# Patient Record
Sex: Female | Born: 1935 | Race: White | Hispanic: No | Smoking: Never smoker
Health system: Southern US, Community
[De-identification: ages and names within clinical notes are randomized; demographics above are authoritative.]

## PROBLEM LIST (undated history)

## (undated) DIAGNOSIS — Z860100 Personal history of colon polyps, unspecified: Secondary | ICD-10-CM

## (undated) DIAGNOSIS — N289 Disorder of kidney and ureter, unspecified: Secondary | ICD-10-CM

## (undated) DIAGNOSIS — H04123 Dry eye syndrome of bilateral lacrimal glands: Secondary | ICD-10-CM

## (undated) DIAGNOSIS — C3491 Malignant neoplasm of unspecified part of right bronchus or lung: Secondary | ICD-10-CM

## (undated) DIAGNOSIS — M1990 Unspecified osteoarthritis, unspecified site: Secondary | ICD-10-CM

## (undated) DIAGNOSIS — K5909 Other constipation: Secondary | ICD-10-CM

## (undated) DIAGNOSIS — M810 Age-related osteoporosis without current pathological fracture: Secondary | ICD-10-CM

## (undated) DIAGNOSIS — M1380 Other specified arthritis, unspecified site: Secondary | ICD-10-CM

## (undated) DIAGNOSIS — K449 Diaphragmatic hernia without obstruction or gangrene: Secondary | ICD-10-CM

## (undated) DIAGNOSIS — I429 Cardiomyopathy, unspecified: Secondary | ICD-10-CM

## (undated) DIAGNOSIS — Z862 Personal history of diseases of the blood and blood-forming organs and certain disorders involving the immune mechanism: Secondary | ICD-10-CM

## (undated) DIAGNOSIS — M069 Rheumatoid arthritis, unspecified: Secondary | ICD-10-CM

## (undated) DIAGNOSIS — R112 Nausea with vomiting, unspecified: Secondary | ICD-10-CM

## (undated) DIAGNOSIS — K219 Gastro-esophageal reflux disease without esophagitis: Secondary | ICD-10-CM

## (undated) DIAGNOSIS — I509 Heart failure, unspecified: Secondary | ICD-10-CM

## (undated) DIAGNOSIS — I7300 Raynaud's syndrome without gangrene: Secondary | ICD-10-CM

## (undated) DIAGNOSIS — R351 Nocturia: Secondary | ICD-10-CM

## (undated) DIAGNOSIS — M419 Scoliosis, unspecified: Secondary | ICD-10-CM

## (undated) DIAGNOSIS — Z872 Personal history of diseases of the skin and subcutaneous tissue: Secondary | ICD-10-CM

## (undated) DIAGNOSIS — E039 Hypothyroidism, unspecified: Secondary | ICD-10-CM

## (undated) DIAGNOSIS — M3500 Sicca syndrome, unspecified: Secondary | ICD-10-CM

## (undated) DIAGNOSIS — D509 Iron deficiency anemia, unspecified: Secondary | ICD-10-CM

## (undated) DIAGNOSIS — M2550 Pain in unspecified joint: Secondary | ICD-10-CM

## (undated) DIAGNOSIS — I447 Left bundle-branch block, unspecified: Secondary | ICD-10-CM

## (undated) DIAGNOSIS — M5430 Sciatica, unspecified side: Secondary | ICD-10-CM

## (undated) DIAGNOSIS — Z9889 Other specified postprocedural states: Secondary | ICD-10-CM

## (undated) DIAGNOSIS — I7381 Erythromelalgia: Secondary | ICD-10-CM

## (undated) HISTORY — DX: Sjogren syndrome, unspecified: M35.00

## (undated) HISTORY — PX: APPENDECTOMY: SHX54

## (undated) HISTORY — PX: COLONOSCOPY: SHX174

## (undated) HISTORY — PX: TONSILLECTOMY: SUR1361

## (undated) HISTORY — DX: Cardiomyopathy, unspecified: I42.9

## (undated) HISTORY — DX: Iron deficiency anemia, unspecified: D50.9

## (undated) HISTORY — DX: Unspecified osteoarthritis, unspecified site: M19.90

## (undated) HISTORY — DX: Raynaud's syndrome without gangrene: I73.00

## (undated) HISTORY — PX: TOTAL ABDOMINAL HYSTERECTOMY: SHX209

---

## 1963-02-18 DIAGNOSIS — I7300 Raynaud's syndrome without gangrene: Secondary | ICD-10-CM

## 1963-02-18 HISTORY — DX: Raynaud's syndrome without gangrene: I73.00

## 1963-02-18 HISTORY — PX: THORACIC SYMPATHETECTOMY: SHX2503

## 2001-04-20 ENCOUNTER — Ambulatory Visit (HOSPITAL_COMMUNITY): Admission: RE | Admit: 2001-04-20 | Discharge: 2001-04-20 | Payer: Self-pay | Admitting: Ophthalmology

## 2003-03-13 ENCOUNTER — Inpatient Hospital Stay (HOSPITAL_COMMUNITY): Admission: EM | Admit: 2003-03-13 | Discharge: 2003-03-16 | Payer: Self-pay | Admitting: *Deleted

## 2005-07-09 ENCOUNTER — Ambulatory Visit: Payer: Self-pay | Admitting: Internal Medicine

## 2005-08-18 ENCOUNTER — Ambulatory Visit: Payer: Self-pay | Admitting: Gastroenterology

## 2005-09-02 ENCOUNTER — Ambulatory Visit: Payer: Self-pay | Admitting: Gastroenterology

## 2005-10-16 ENCOUNTER — Ambulatory Visit: Payer: Self-pay | Admitting: Internal Medicine

## 2006-06-09 ENCOUNTER — Ambulatory Visit: Payer: Self-pay | Admitting: Family Medicine

## 2006-06-09 LAB — CONVERTED CEMR LAB
Basophils Relative: 0.8 % (ref 0.0–1.0)
Eosinophils Relative: 1 % (ref 0.0–5.0)
Glucose, Bld: 82 mg/dL (ref 70–99)
HDL: 67.6 mg/dL (ref >39.0)
LDL Cholesterol: 90 mg/dL (ref 0–99)
Lymphocytes Relative: 38.5 % (ref 12.0–46.0)
Monocytes Relative: 10.8 % (ref 3.0–11.0)
Platelets: 163 10*3/uL (ref 150–400)
RDW: 12.6 % (ref 11.5–14.6)
TSH: 4.16 microintl units/mL (ref 0.35–5.50)
Triglycerides: 78 mg/dL (ref 0–149)
VLDL: 16 mg/dL (ref 0–40)
WBC: 3.1 10*3/uL — ABNORMAL LOW (ref 4.5–10.5)

## 2006-06-29 DIAGNOSIS — I7300 Raynaud's syndrome without gangrene: Secondary | ICD-10-CM | POA: Insufficient documentation

## 2006-06-29 DIAGNOSIS — E162 Hypoglycemia, unspecified: Secondary | ICD-10-CM | POA: Insufficient documentation

## 2006-06-30 ENCOUNTER — Encounter (INDEPENDENT_AMBULATORY_CARE_PROVIDER_SITE_OTHER): Payer: Self-pay | Admitting: Family Medicine

## 2006-06-30 ENCOUNTER — Ambulatory Visit: Payer: Self-pay | Admitting: Family Medicine

## 2006-06-30 ENCOUNTER — Encounter: Payer: Self-pay | Admitting: Family Medicine

## 2006-09-24 ENCOUNTER — Ambulatory Visit: Payer: Self-pay | Admitting: Family Medicine

## 2006-11-02 ENCOUNTER — Telehealth (INDEPENDENT_AMBULATORY_CARE_PROVIDER_SITE_OTHER): Payer: Self-pay | Admitting: *Deleted

## 2006-11-02 ENCOUNTER — Ambulatory Visit: Payer: Self-pay | Admitting: Family Medicine

## 2006-11-02 DIAGNOSIS — L259 Unspecified contact dermatitis, unspecified cause: Secondary | ICD-10-CM | POA: Insufficient documentation

## 2006-12-21 ENCOUNTER — Ambulatory Visit (HOSPITAL_COMMUNITY): Admission: RE | Admit: 2006-12-21 | Discharge: 2006-12-21 | Payer: Self-pay | Admitting: Family Medicine

## 2007-03-18 ENCOUNTER — Ambulatory Visit: Payer: Self-pay | Admitting: Family Medicine

## 2007-03-19 ENCOUNTER — Telehealth (INDEPENDENT_AMBULATORY_CARE_PROVIDER_SITE_OTHER): Payer: Self-pay | Admitting: *Deleted

## 2007-03-19 LAB — CONVERTED CEMR LAB
ALT: 20 units/L (ref 0–35)
AST: 31 units/L (ref 0–37)
Albumin: 3.9 g/dL (ref 3.5–5.2)
Basophils Absolute: 0 10*3/uL (ref 0.0–0.1)
Basophils Relative: 0.9 % (ref 0.0–1.0)
Bilirubin, Direct: 0.1 mg/dL (ref 0.0–0.3)
HCT: 33.9 % — ABNORMAL LOW (ref 36.0–46.0)
MCHC: 34.4 g/dL (ref 30.0–36.0)
Neutrophils Relative %: 49.3 % (ref 43.0–77.0)
RBC: 3.76 M/uL — ABNORMAL LOW (ref 3.87–5.11)
RDW: 12.3 % (ref 11.5–14.6)
Total Bilirubin: 0.7 mg/dL (ref 0.3–1.2)
WBC: 3.6 10*3/uL — ABNORMAL LOW (ref 4.5–10.5)

## 2007-03-29 ENCOUNTER — Encounter (INDEPENDENT_AMBULATORY_CARE_PROVIDER_SITE_OTHER): Payer: Self-pay | Admitting: *Deleted

## 2007-03-29 ENCOUNTER — Telehealth (INDEPENDENT_AMBULATORY_CARE_PROVIDER_SITE_OTHER): Payer: Self-pay | Admitting: *Deleted

## 2007-11-29 ENCOUNTER — Ambulatory Visit: Payer: Self-pay | Admitting: Family Medicine

## 2007-11-30 ENCOUNTER — Encounter (INDEPENDENT_AMBULATORY_CARE_PROVIDER_SITE_OTHER): Payer: Self-pay | Admitting: *Deleted

## 2007-12-23 ENCOUNTER — Ambulatory Visit (HOSPITAL_COMMUNITY): Admission: RE | Admit: 2007-12-23 | Discharge: 2007-12-23 | Payer: Self-pay | Admitting: Family Medicine

## 2007-12-23 ENCOUNTER — Encounter: Payer: Self-pay | Admitting: Family Medicine

## 2008-02-18 DIAGNOSIS — I447 Left bundle-branch block, unspecified: Secondary | ICD-10-CM

## 2008-02-18 HISTORY — DX: Left bundle-branch block, unspecified: I44.7

## 2008-09-22 ENCOUNTER — Encounter (INDEPENDENT_AMBULATORY_CARE_PROVIDER_SITE_OTHER): Payer: Self-pay | Admitting: *Deleted

## 2008-09-22 ENCOUNTER — Encounter: Payer: Self-pay | Admitting: Family Medicine

## 2008-09-22 LAB — CONVERTED CEMR LAB
ALT: 20 units/L
AST: 33 units/L
Albumin: 4.1 g/dL
Alkaline Phosphatase: 38 units/L
BUN: 17 mg/dL
MCV: 90.3 fL
Potassium, serum: 4.3 mmol/L
Sodium, serum: 133 mmol/L
Total Protein: 6.3 g/dL
WBC, blood: 3.2 10*3/uL

## 2008-09-25 ENCOUNTER — Encounter: Payer: Self-pay | Admitting: Family Medicine

## 2008-11-29 ENCOUNTER — Ambulatory Visit: Payer: Self-pay | Admitting: Family Medicine

## 2008-11-29 DIAGNOSIS — J069 Acute upper respiratory infection, unspecified: Secondary | ICD-10-CM | POA: Insufficient documentation

## 2008-11-29 DIAGNOSIS — R9431 Abnormal electrocardiogram [ECG] [EKG]: Secondary | ICD-10-CM | POA: Insufficient documentation

## 2008-11-29 DIAGNOSIS — M3500 Sicca syndrome, unspecified: Secondary | ICD-10-CM | POA: Insufficient documentation

## 2008-11-30 ENCOUNTER — Encounter (INDEPENDENT_AMBULATORY_CARE_PROVIDER_SITE_OTHER): Payer: Self-pay | Admitting: *Deleted

## 2008-12-05 ENCOUNTER — Ambulatory Visit: Payer: Self-pay | Admitting: Family Medicine

## 2008-12-05 LAB — CONVERTED CEMR LAB: OCCULT 2: NEGATIVE

## 2008-12-06 ENCOUNTER — Encounter (INDEPENDENT_AMBULATORY_CARE_PROVIDER_SITE_OTHER): Payer: Self-pay | Admitting: *Deleted

## 2008-12-11 ENCOUNTER — Ambulatory Visit: Payer: Self-pay | Admitting: Cardiology

## 2008-12-11 ENCOUNTER — Encounter: Payer: Self-pay | Admitting: Family Medicine

## 2008-12-11 ENCOUNTER — Ambulatory Visit (HOSPITAL_COMMUNITY): Admission: RE | Admit: 2008-12-11 | Discharge: 2008-12-11 | Payer: Self-pay | Admitting: Family Medicine

## 2008-12-11 ENCOUNTER — Ambulatory Visit: Payer: Self-pay

## 2008-12-11 HISTORY — PX: TRANSTHORACIC ECHOCARDIOGRAM: SHX275

## 2008-12-12 ENCOUNTER — Encounter: Payer: Self-pay | Admitting: Family Medicine

## 2008-12-12 ENCOUNTER — Emergency Department (HOSPITAL_COMMUNITY): Admission: EM | Admit: 2008-12-12 | Discharge: 2008-12-12 | Payer: Self-pay | Admitting: Emergency Medicine

## 2008-12-14 ENCOUNTER — Ambulatory Visit: Payer: Self-pay | Admitting: Internal Medicine

## 2008-12-14 DIAGNOSIS — R9389 Abnormal findings on diagnostic imaging of other specified body structures: Secondary | ICD-10-CM | POA: Insufficient documentation

## 2008-12-18 ENCOUNTER — Telehealth: Payer: Self-pay | Admitting: Internal Medicine

## 2008-12-18 ENCOUNTER — Encounter: Payer: Self-pay | Admitting: Internal Medicine

## 2008-12-18 DIAGNOSIS — R930 Abnormal findings on diagnostic imaging of skull and head, not elsewhere classified: Secondary | ICD-10-CM | POA: Insufficient documentation

## 2008-12-18 HISTORY — PX: CARDIOVASCULAR STRESS TEST: SHX262

## 2008-12-19 ENCOUNTER — Telehealth (INDEPENDENT_AMBULATORY_CARE_PROVIDER_SITE_OTHER): Payer: Self-pay | Admitting: *Deleted

## 2008-12-20 ENCOUNTER — Telehealth: Payer: Self-pay | Admitting: Internal Medicine

## 2008-12-21 ENCOUNTER — Telehealth: Payer: Self-pay | Admitting: Internal Medicine

## 2008-12-21 ENCOUNTER — Telehealth (INDEPENDENT_AMBULATORY_CARE_PROVIDER_SITE_OTHER): Payer: Self-pay | Admitting: *Deleted

## 2008-12-21 ENCOUNTER — Telehealth (INDEPENDENT_AMBULATORY_CARE_PROVIDER_SITE_OTHER): Payer: Self-pay

## 2008-12-25 ENCOUNTER — Ambulatory Visit: Payer: Self-pay

## 2008-12-25 ENCOUNTER — Ambulatory Visit: Payer: Self-pay | Admitting: Cardiology

## 2008-12-25 ENCOUNTER — Encounter (HOSPITAL_COMMUNITY): Admission: RE | Admit: 2008-12-25 | Discharge: 2009-02-13 | Payer: Self-pay | Admitting: Internal Medicine

## 2008-12-26 ENCOUNTER — Ambulatory Visit (HOSPITAL_COMMUNITY): Admission: RE | Admit: 2008-12-26 | Discharge: 2008-12-26 | Payer: Self-pay | Admitting: Internal Medicine

## 2008-12-28 ENCOUNTER — Ambulatory Visit: Payer: Self-pay | Admitting: Pulmonary Disease

## 2008-12-28 ENCOUNTER — Ambulatory Visit: Payer: Self-pay | Admitting: Internal Medicine

## 2009-01-05 ENCOUNTER — Telehealth (INDEPENDENT_AMBULATORY_CARE_PROVIDER_SITE_OTHER): Payer: Self-pay | Admitting: *Deleted

## 2009-01-15 ENCOUNTER — Ambulatory Visit: Payer: Self-pay | Admitting: Internal Medicine

## 2009-01-16 ENCOUNTER — Encounter: Payer: Self-pay | Admitting: Internal Medicine

## 2009-01-16 LAB — CONVERTED CEMR LAB: Bacteria, UA: NONE SEEN

## 2009-03-16 ENCOUNTER — Ambulatory Visit: Payer: Self-pay | Admitting: Family

## 2009-03-16 ENCOUNTER — Ambulatory Visit (HOSPITAL_BASED_OUTPATIENT_CLINIC_OR_DEPARTMENT_OTHER): Admission: RE | Admit: 2009-03-16 | Discharge: 2009-03-16 | Payer: Self-pay | Admitting: Internal Medicine

## 2009-03-16 ENCOUNTER — Ambulatory Visit: Payer: Self-pay | Admitting: Diagnostic Radiology

## 2009-03-16 DIAGNOSIS — M549 Dorsalgia, unspecified: Secondary | ICD-10-CM | POA: Insufficient documentation

## 2009-03-16 DIAGNOSIS — M545 Low back pain, unspecified: Secondary | ICD-10-CM | POA: Insufficient documentation

## 2009-03-16 LAB — CONVERTED CEMR LAB
Bilirubin Urine: NEGATIVE
Blood in Urine, dipstick: NEGATIVE
Glucose, Urine, Semiquant: NEGATIVE
Ketones, urine, test strip: NEGATIVE

## 2009-03-17 ENCOUNTER — Encounter: Payer: Self-pay | Admitting: Family Medicine

## 2009-03-19 ENCOUNTER — Telehealth (INDEPENDENT_AMBULATORY_CARE_PROVIDER_SITE_OTHER): Payer: Self-pay | Admitting: *Deleted

## 2009-03-26 ENCOUNTER — Ambulatory Visit: Payer: Self-pay | Admitting: Radiology

## 2009-03-26 ENCOUNTER — Ambulatory Visit (HOSPITAL_BASED_OUTPATIENT_CLINIC_OR_DEPARTMENT_OTHER): Admission: RE | Admit: 2009-03-26 | Discharge: 2009-03-26 | Payer: Self-pay | Admitting: Pulmonary Disease

## 2009-04-05 ENCOUNTER — Ambulatory Visit: Payer: Self-pay | Admitting: Pulmonary Disease

## 2009-06-23 ENCOUNTER — Ambulatory Visit: Payer: Self-pay | Admitting: Diagnostic Radiology

## 2009-06-23 ENCOUNTER — Emergency Department (HOSPITAL_BASED_OUTPATIENT_CLINIC_OR_DEPARTMENT_OTHER): Admission: EM | Admit: 2009-06-23 | Discharge: 2009-06-23 | Payer: Self-pay | Admitting: Emergency Medicine

## 2009-10-01 ENCOUNTER — Ambulatory Visit: Payer: Self-pay | Admitting: Family Medicine

## 2009-10-01 ENCOUNTER — Telehealth (INDEPENDENT_AMBULATORY_CARE_PROVIDER_SITE_OTHER): Payer: Self-pay | Admitting: *Deleted

## 2009-10-01 DIAGNOSIS — R42 Dizziness and giddiness: Secondary | ICD-10-CM | POA: Insufficient documentation

## 2009-10-01 LAB — CONVERTED CEMR LAB
Glucose, Urine, Semiquant: NEGATIVE
KOH Prep: NEGATIVE
Nitrite: NEGATIVE
Protein, U semiquant: NEGATIVE
Urobilinogen, UA: 0.2
WBC Urine, dipstick: NEGATIVE

## 2009-10-04 ENCOUNTER — Encounter: Payer: Self-pay | Admitting: Family Medicine

## 2009-11-05 ENCOUNTER — Encounter: Payer: Self-pay | Admitting: Family Medicine

## 2009-11-28 ENCOUNTER — Telehealth: Payer: Self-pay | Admitting: Family Medicine

## 2009-11-29 ENCOUNTER — Ambulatory Visit: Payer: Self-pay | Admitting: Family Medicine

## 2009-11-29 DIAGNOSIS — S8010XA Contusion of unspecified lower leg, initial encounter: Secondary | ICD-10-CM | POA: Insufficient documentation

## 2009-11-29 LAB — CONVERTED CEMR LAB
Basophils Relative: 0.8 % (ref 0.0–3.0)
Eosinophils Absolute: 0 10*3/uL (ref 0.0–0.7)
Eosinophils Relative: 0.7 % (ref 0.0–5.0)
Lymphocytes Relative: 31.1 % (ref 12.0–46.0)
Neutrophils Relative %: 57.2 % (ref 43.0–77.0)
Platelets: 163 10*3/uL (ref 150.0–400.0)
RBC: 3.74 M/uL — ABNORMAL LOW (ref 3.87–5.11)
WBC: 3.5 10*3/uL — ABNORMAL LOW (ref 4.5–10.5)

## 2009-12-11 ENCOUNTER — Ambulatory Visit (HOSPITAL_COMMUNITY): Admission: RE | Admit: 2009-12-11 | Discharge: 2009-12-11 | Payer: Self-pay | Admitting: Family Medicine

## 2009-12-24 ENCOUNTER — Encounter: Admission: RE | Admit: 2009-12-24 | Discharge: 2009-12-24 | Payer: Self-pay | Admitting: Family Medicine

## 2009-12-24 LAB — HM MAMMOGRAPHY: HM Mammogram: NORMAL

## 2010-03-07 ENCOUNTER — Other Ambulatory Visit: Payer: Self-pay | Admitting: Family Medicine

## 2010-03-07 ENCOUNTER — Ambulatory Visit
Admission: RE | Admit: 2010-03-07 | Discharge: 2010-03-07 | Payer: Self-pay | Source: Home / Self Care | Attending: Family Medicine | Admitting: Family Medicine

## 2010-03-07 ENCOUNTER — Encounter: Payer: Self-pay | Admitting: Family Medicine

## 2010-03-07 DIAGNOSIS — R209 Unspecified disturbances of skin sensation: Secondary | ICD-10-CM | POA: Insufficient documentation

## 2010-03-07 DIAGNOSIS — M810 Age-related osteoporosis without current pathological fracture: Secondary | ICD-10-CM | POA: Insufficient documentation

## 2010-03-07 DIAGNOSIS — M8580 Other specified disorders of bone density and structure, unspecified site: Secondary | ICD-10-CM | POA: Insufficient documentation

## 2010-03-07 LAB — CBC WITH DIFFERENTIAL/PLATELET
Basophils Absolute: 0 10*3/uL (ref 0.0–0.1)
Basophils Relative: 0.4 % (ref 0.0–3.0)
Eosinophils Absolute: 0 10*3/uL (ref 0.0–0.7)
Eosinophils Relative: 0.4 % (ref 0.0–5.0)
HCT: 35.2 % — ABNORMAL LOW (ref 36.0–46.0)
Hemoglobin: 12.1 g/dL (ref 12.0–15.0)
Lymphocytes Relative: 23.7 % (ref 12.0–46.0)
Lymphs Abs: 1 10*3/uL (ref 0.7–4.0)
MCHC: 34.5 g/dL (ref 30.0–36.0)
MCV: 90.4 fl (ref 78.0–100.0)
Monocytes Absolute: 0.4 10*3/uL (ref 0.1–1.0)
Monocytes Relative: 9.4 % (ref 3.0–12.0)
Neutro Abs: 2.9 10*3/uL (ref 1.4–7.7)
Neutrophils Relative %: 66.1 % (ref 43.0–77.0)
Platelets: 166 10*3/uL (ref 150.0–400.0)
RBC: 3.89 Mil/uL (ref 3.87–5.11)
RDW: 13.4 % (ref 11.5–14.6)
WBC: 4.4 10*3/uL — ABNORMAL LOW (ref 4.5–10.5)

## 2010-03-07 LAB — BASIC METABOLIC PANEL
BUN: 20 mg/dL (ref 6–23)
CO2: 29 mEq/L (ref 19–32)
Calcium: 9.1 mg/dL (ref 8.4–10.5)
Chloride: 95 mEq/L — ABNORMAL LOW (ref 96–112)
Creatinine, Ser: 0.9 mg/dL (ref 0.4–1.2)
GFR: 67.05 mL/min (ref >60.00)
Glucose, Bld: 74 mg/dL (ref 70–99)
Potassium: 4.2 mEq/L (ref 3.5–5.1)
Sodium: 131 mEq/L — ABNORMAL LOW (ref 135–145)

## 2010-03-07 LAB — B12 AND FOLATE PANEL
Folate: 20.5 ng/mL (ref >5.9)
Vitamin B-12: 649 pg/mL (ref 211–911)

## 2010-03-07 LAB — HEPATIC FUNCTION PANEL
ALT: 19 U/L (ref 0–35)
AST: 29 U/L (ref 0–37)
Albumin: 4.2 g/dL (ref 3.5–5.2)
Alkaline Phosphatase: 35 U/L — ABNORMAL LOW (ref 39–117)
Bilirubin, Direct: 0.1 mg/dL (ref 0.0–0.3)
Total Bilirubin: 0.5 mg/dL (ref 0.3–1.2)
Total Protein: 6.7 g/dL (ref 6.0–8.3)

## 2010-03-07 LAB — LIPID PANEL
Cholesterol: 189 mg/dL (ref 0–200)
HDL: 76.7 mg/dL (ref >39.00)
LDL Cholesterol: 102 mg/dL — ABNORMAL HIGH (ref 0–99)
Total CHOL/HDL Ratio: 2
Triglycerides: 53 mg/dL (ref 0.0–149.0)
VLDL: 10.6 mg/dL (ref 0.0–40.0)

## 2010-03-07 LAB — TSH: TSH: 4.13 u[IU]/mL (ref 0.35–5.50)

## 2010-03-10 ENCOUNTER — Encounter: Payer: Self-pay | Admitting: Pulmonary Disease

## 2010-03-11 ENCOUNTER — Encounter: Payer: Self-pay | Admitting: Family Medicine

## 2010-03-17 LAB — CONVERTED CEMR LAB
ALT: 20 units/L (ref 0–35)
AST: 29 units/L (ref 0–37)
Basophils Absolute: 0 10*3/uL (ref 0.0–0.1)
Basophils Relative: 0.6 % (ref 0.0–1.0)
Bilirubin, Direct: 0.1 mg/dL (ref 0.0–0.3)
CO2: 32 meq/L (ref 19–32)
Chloride: 101 meq/L (ref 96–112)
Cholesterol: 179 mg/dL (ref 0–200)
Cholesterol: 186 mg/dL (ref 0–200)
Complement C4, Body Fluid: 18 mg/dL (ref 16–47)
ENA SM Ab Ser-aCnc: <0.2 (ref <1.0)
Eosinophils Relative: 0.8 % (ref 0.0–5.0)
HCT: 35.5 % — ABNORMAL LOW (ref 36.0–46.0)
Hemoglobin: 12 g/dL (ref 12.0–15.0)
LDL Cholesterol: 102 mg/dL — ABNORMAL HIGH (ref 0–99)
LDL Cholesterol: 98 mg/dL (ref 0–99)
Lymphocytes Relative: 30.9 % (ref 12.0–46.0)
Lymphocytes Relative: 35.2 % (ref 12.0–46.0)
MCHC: 34.7 g/dL (ref 30.0–36.0)
Monocytes Absolute: 0.4 10*3/uL (ref 0.2–0.7)
Neutro Abs: 2.1 10*3/uL (ref 1.4–7.7)
Neutrophils Relative %: 52.5 % (ref 43.0–77.0)
Neutrophils Relative %: 56.8 % (ref 43.0–77.0)
Potassium: 4.1 meq/L (ref 3.5–5.1)
RDW: 12.2 % (ref 11.5–14.6)
RDW: 12.5 % (ref 11.5–14.6)
Sed Rate: 6 mm/hr (ref 0–25)
Sodium: 138 meq/L (ref 135–145)
TSH: 3.83 microintl units/mL (ref 0.35–5.50)
Total Bilirubin: 0.7 mg/dL (ref 0.3–1.2)
Triglycerides: 62 mg/dL (ref 0–149)
VLDL: 12 mg/dL (ref 0–40)
VLDL: 9.4 mg/dL (ref 0.0–40.0)
WBC: 3.8 10*3/uL — ABNORMAL LOW (ref 4.5–10.5)
ds DNA Ab: 1 (ref <5)

## 2010-03-19 NOTE — Assessment & Plan Note (Signed)
Summary: uti - redness/cb   Vital Signs:  Patient profile:   77 year old female Height:      64.25 inches (163.19 cm) Weight:      111 pounds (50.45 kg) BMI:     18.97 O2 Sat:      94 % on Room air Temp:     97.8 degrees F (36.56 degrees C) oral Pulse rate:   74 / minute BP sitting:   110 / 60  (right arm) Cuff size:   regular  Vitals Entered By: Lucious Groves CMA (October 01, 2009 3:48 PM)  O2 Flow:  Room air CC: C/O Possible UTI--urinary burning with hesititency and red spot on vulva./kb Is Patient Diabetic? No Pain Assessment Patient in pain? no      Comments Patient notes that she saw the red spot on vulva with mirror, has some nausea, vomited yesterday and a little diarrhea./kb   History of Present Illness: 77 yo woman here today w/ ? UTI.  2 weeks ago felt she had a yeast infxn.  used an at home test kit- 'it was a little tinge-y'.  didn't do anything about it.  sxs have worsened.  now w/ itching and burning.  had some dizziness, vomiting, and diarrhea yesterday morning at 4am- has hx of hypoglycemia.  having small 'waves of nausea' today.  has a 'red spot' on her vulva- looked w/ a mirror.  denies frequency, urgency, hesitancy.  Problems Prior to Update: 1)  Dysuria  (ICD-788.1) 2)  Back Pain, Left  (ICD-724.5) 3)  Ct, Chest, Abnormal  (ICD-793.1) 4)  Abnormal Echocardiogram  (ICD-793.2) 5)  Abnormal Ekg  (ICD-794.31) 6)  Abnormal Electrocardiogram  (ICD-794.31) 7)  Uri  (ICD-465.9) 8)  Sjogren's Syndrome  (ICD-710.2) 9)  Healthy Adult Female  (ICD-V70.0) 10)  Screening For Lipoid Disorders  (ICD-V77.91) 11)  Contact Dermatitis  (ICD-692.9) 12)  Anemia-iron Deficiency  (ICD-280.9) 13)  Hypoglycemia  (ICD-251.2) 14)  Raynaud's Disease  (ICD-443.0)  Current Medications (verified): 1)  Plaquenil 200 Mg  Tabs (Hydroxychloroquine Sulfate) .... Take Two Times A Day 2)  Mobic 15 Mg  Tabs (Meloxicam) .... Take One Tablet Daily 3)  Biotin 1000 Mcg Tabs (Biotin) .... Two  Times A Day 4)  Vitamin C 500 Mg  Tabs (Ascorbic Acid) .... Take One Tablet By Mouth Once Daily. 5)  Multivitamins   Tabs (Multiple Vitamin) .... Take One Tablet By Mouth Once Daily. 6)  Vitamin D 1000 Unit Tabs (Cholecalciferol) .Marland Kitchen.. 1 Tab Once Daily 7)  Glucosamine-Chondroitin 1500-1200 Mg/66ml Liqd (Glucosamine-Chondroitin) .Marland Kitchen.. 1 Tab Once Daily 8)  Calcium-Vitamin D 500-200 Mg-Unit Tabs (Calcium Carbonate-Vitamin D) .... Take 1 Tablet By Mouth Once A Day 9)  Stool Softener 100 Mg Caps (Docusate Sodium) .Marland Kitchen.. 1 Cap Two Times A Day 10)  Diflucan 150 Mg Tabs (Fluconazole) .... Once Daily.  May Repeat in 3 Days If Symptoms Persist  Allergies (verified): 1)  ! Compazine 2)  ! Aspirin (Aspirin)  Review of Systems      See HPI  Physical Exam  General:  thin white female, NAD Lungs:  Normal respiratory effort, chest expands symmetrically. Lungs are clear to auscultation, no crackles or wheezes. Heart:  Normal rate and regular rhythm. S1 and S2 normal without gallop, murmur, click, rub or other extra sounds. Abdomen:  no CVA or suprapubic tenderness soft, NT/ND, +BS Genitalia:  red spot is not on vulva or labia, redness is erythematous urethra no visible yeast or vaginal discharge thin mucosa, + atrophy  Neurologic:  alert & oriented X3, strength normal in all extremities, sensation intact to light touch, and gait normal.     Impression & Recommendations:  Problem # 1:  DYSURIA (ICD-788.1) Assessment New pt's UA normal- no evidence of infxn.  wet prep w/ rare yeast.  uncertain as to cause of very red urethra but the external irritation is likely the cause of pt's sxs.  start diflucan, miconazole cream.  refer to urology.  Pt expresses understanding and is in agreement w/ this plan. Orders: T-Culture, Urine ([id number]) Urology Referral (Urology) Wet Prep [phone]) UA Dipstick w/o Micro (manual) ([id number]) Prescription Created Electronically [phone])  Problem # 2:  DIZZINESS  (ICD-780.4) Assessment: New pt's sxs sound like a hypoglycemic or vagal episode.  pt admits to eating little prior to bed.  sxs have resolved and PE WNL.  reviewed supportive care and red flags that should prompt return.  Pt expresses understanding and is in agreement w/ this plan.  Complete Medication List: 1)  Plaquenil 200 Mg Tabs (Hydroxychloroquine sulfate) .... Take two times a day 2)  Mobic 15 Mg Tabs (Meloxicam) .... Take one tablet daily 3)  Biotin 1000 Mcg Tabs (Biotin) .... Two times a day 4)  Vitamin C 500 Mg Tabs (Ascorbic acid) .... Take one tablet by mouth once daily. 5)  Multivitamins Tabs (Multiple vitamin) .... Take one tablet by mouth once daily. 6)  Vitamin D 1000 Unit Tabs (Cholecalciferol) .Marland Kitchen.. 1 tab once daily 7)  Glucosamine-chondroitin 1500-1200 Mg/48ml Liqd (Glucosamine-chondroitin) .Marland Kitchen.. 1 tab once daily 8)  Calcium-vitamin D 500-200 Mg-unit Tabs (Calcium carbonate-vitamin d) .... Take 1 tablet by mouth once a day 9)  Stool Softener 100 Mg Caps (Docusate sodium) .Marland Kitchen.. 1 cap two times a day 10)  Diflucan 150 Mg Tabs (Fluconazole) .... Once daily.  may repeat in 3 days if symptoms persist  Patient Instructions: 1)  Someone will call you with your urology appt 2)  The red spot that you see is an inflamed urethra 3)  Take the Diflucan for yeast 4)  Use over the counter miconazole cream to soothe the irritation 5)  We'll culture the urine to rule out infection 6)  Hang in there!! Prescriptions: DIFLUCAN 150 MG TABS (FLUCONAZOLE) once daily.  may repeat in 3 days if symptoms persist  #2 x 0   Entered and Authorized by:   Neena Rhymes MD   Signed by:   Neena Rhymes MD on 10/01/2009   Method used:   Electronically to        [facility] [phone]* (retail)       [address]       [place], [place]  [address]       Ph: [phone]       Fax: [phone]   RxID:   [id number]   Laboratory Results   Urine Tests  Date/Time  Received: Lucious Groves [facility]  October 01, 2009 3:45 PM  Date/Time Reported: Lucious Groves [facility]  October 01, 2009 3:45 PM   Routine Urinalysis   Color: yellow Appearance: Clear Glucose: negative   (Normal Range: Negative) Bilirubin: negative   (Normal Range: Negative) Ketone: negative   (Normal Range: Negative) Spec. Gravity: >=1.030   (Normal Range: 1.003-1.035) Blood: negative   (Normal Range: Negative) pH: 5.0   (Normal Range: 5.0-8.0) Protein: negative   (Normal Range: Negative) Urobilinogen: 0.2   (Normal Range: 0-1) Nitrite: negative   (Normal Range: Negative) Leukocyte Esterace: negative   (  Normal Range: Negative)      Wet Mount WBC/hpf: 1-5 Bacteria/hpf: rare Clue cells/hpf: none Yeast/hpf: few Wet [place] KOH: Negative Trichomonas/hpf: none    Laboratory Results   Urine Tests    Routine Urinalysis   Color: yellow Appearance: Clear Glucose: negative   (Normal Range: Negative) Bilirubin: negative   (Normal Range: Negative) Ketone: negative   (Normal Range: Negative) Spec. Gravity: >=1.030   (Normal Range: 1.003-1.035) Blood: negative   (Normal Range: Negative) pH: 5.0   (Normal Range: 5.0-8.0) Protein: negative   (Normal Range: Negative) Urobilinogen: 0.2   (Normal Range: 0-1) Nitrite: negative   (Normal Range: Negative) Leukocyte Esterace: negative   (Normal Range: Negative)      Wet Mount/KOH KOH Negative

## 2010-03-19 NOTE — Assessment & Plan Note (Signed)
Summary: 3 months in [facility] office/apc   Visit Type:  Follow-up Copy to:  Drue Novel Primary Provider/Referring Provider:  Neena Rhymes MD  CC:  Pt here for follow up and review results of CT Scan.  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 She had an ER visit on 10/26 for chest pain & CRX showed RUL opacity. She reports an episod eof bronchitis around that time that she may have contracted from her grandson. Took cipro x 3ds , then keflex for UTI. CT chest  with contrast  showed Right upper lobe mass - irregularly marginated and with  associated air bronchograms.  The appearance is not typical for bronchogenic carcinoma, and postinflammatory scarring/atypical  round atelectasis is favored as the etiology PET scan showed Low-level F D G uptake (SUV 2.7) associated with right upper lobe pulmonary  parenchymal consolidation. I reviewed imaging studies with pt & daughter. Cards w/u >> ECHO showed EF 45-50%. No clear regional wall motion abnormalities Nuclear study with EF 58% with fixed septal defect likely due to LBBB.  April 05, 2009 1:44 PM  post CT FU , no new symptoms Reviewed CT >> no change in RUL opacity with air bronchograms    Current Medications (verified): 1)  Plaquenil 200 Mg  Tabs (Hydroxychloroquine Sulfate) .... Take Two Times A Day 2)  Mobic 15 Mg  Tabs (Meloxicam) .... Take One Tablet Daily 3)  Biotin 1000 Mcg Tabs (Biotin) .... Two Times A Day 4)  Vitamin C 500 Mg  Tabs (Ascorbic Acid) .... Take One Tablet By Mouth Once Daily. 5)  Multivitamins   Tabs (Multiple Vitamin) .... Take One Tablet By Mouth Once Daily. 6)  Vitamin D 1000 Unit Tabs (Cholecalciferol) .Marland Kitchen.. 1 Tab Once Daily 7)  Fish Oil   Oil (Fish Oil) .... Take One Tablet By Mouth Once Daily. 8)  Aspirin 81 Mg Tbec (Aspirin) .... Take One Tablet By Mouth Daily 9)   Glucosamine-Chondroitin 1500-1200 Mg/17ml Liqd (Glucosamine-Chondroitin) .Marland Kitchen.. 1 Tab Once Daily 10)  Calcium-Vitamin D 500-200 Mg-Unit Tabs (Calcium Carbonate-Vitamin D) .... Take 1 Tablet By Mouth Once A Day 11)  Stool Softener 100 Mg Caps (Docusate Sodium) .Marland Kitchen.. 1 Cap Two Times A Day  Allergies (verified): 1)  ! Compazine  Past History:  Social History: Last updated: 11/29/2007 married, lives w/ husband.  daughter and grandchild local.  no tobacco, rare social, no drugs  Past Medical History: Anemia-iron deficiency Sjogren's - dr Cardell Peach Abnormal ECG with new LBBB 10/10    --Echo 10/10: EF 45-50%    Nuclear stress 11/10: EF 58% no ischemia. fixed septal defect liekly LBBB Raynaud's phenomenon s/p thoracic sympathectomy (1965) Skin lupus  Review of Systems  The patient denies anorexia, fever, weight loss, weight gain, vision loss, decreased hearing, hoarseness, chest pain, syncope, dyspnea on exertion, peripheral edema, prolonged cough, headaches, hemoptysis, abdominal pain, melena, hematochezia, severe indigestion/heartburn, hematuria, muscle weakness, difficulty walking, depression, unusual weight change, and abnormal bleeding.    Vital Signs:  Patient profile:   77 year old female Height:      64.25 inches Weight:      112 pounds O2 Sat:      97 % on Room air Temp:     98.4 degrees F oral Pulse rate:   76 / minute BP sitting:   100 / 68  (left arm) Cuff size:   regular  Vitals Entered By: Zackery Barefoot CMA (April 05, 2009 1:35 PM)  O2 Flow:  Room air CC: Pt here for follow up and review results of CT Scan Comments Medications reviewed with patient Verified pt's contact number Zackery Barefoot CMA  April 05, 2009 1:37 PM    Physical Exam  Additional Exam:  Gen. Pleasant, thin woman, in no distress, normal affect ENT - no lesions, no post nasal drip Neck: No JVD, no thyromegaly, no carotid bruits Lungs: lt thoracic scar, no use of accessory muscles, no dullness  to percussion, clear without rales or rhonchi  Cardiovascular: Rhythm regular, heart sounds  normal, no murmurs or gallops, no peripheral edema Musculoskeletal: No deformities, no cyanosis or clubbing      CT of Chest  Procedure date:  03/26/2009  Findings:      Comparison: CT 12/12/2008.   Findings: Right upper lobe opacity is unchanged.  It measures 4.1 x 2.1 x 4.3 cm compared to the prior exam when it measured 4.3 x 2.4 x 4.3 cm. There are air bronchograms.  Nearby pleural tenting. There are no pathologically enlarged mediastinal lymph nodes.   IMPRESSION: Stable right upper lobe mass-like consolidation.  Findings may be due to a chronic inflammatory/infectious process.  Malignancy cannot be excluded including bronchoalveolar cell carcinoma and lymphoma.  Impression & Recommendations:  Problem # 1:  CT, CHEST, ABNORMAL (ICD-793.1) Unchanged RUL opacity - favor inflamation & scar tissue  rather than malignancy  Will need serial FU x 2 yrs Next CT in 6 months given stability Orders: Est. Patient Level III (01093) Radiology Referral (Radiology)  Medications Added to Medication List This Visit: 1)  Calcium-vitamin D 500-200 Mg-unit Tabs (Calcium carbonate-vitamin d) .... Take 1 tablet by mouth once a day  Patient Instructions: 1)  Copy sent to:Dr Cardell Peach, Dr Drue Novel 2)  Please schedule a follow-up appointment in 6 months after CT scan

## 2010-03-19 NOTE — Letter (Signed)
Summary: [facility]  [facility]   Imported By: Lanelle Bal 11/13/2009 12:39:25  _____________________________________________________________________  External Attachment:    Type:   Image     Comment:   External Document

## 2010-03-19 NOTE — Progress Notes (Signed)
Summary: appt scheduled  ---- Converted from flag ---- ---- 10/01/2009 3:37 PM, Okey Regal Spring wrote: called patient & scheduled appt with dr Beverely Low today   ---- 10/01/2009 9:19 AM, Barnie Mort wrote: Thanks [employer].  I will look into that. [name]-Please call this patient and make sure she is taking care of.  ---- 10/01/2009 9:03 AM, Verdell Face wrote: [name], are your phones working??  I tried calling for one of your patient's and it just rings, main line & back line...  [name] -- 08-04-2030 --thinks she has UTI and request ov today w/you. [phone] is her #  Verdell Face ------------------------------

## 2010-03-19 NOTE — Progress Notes (Signed)
Summary: Needs Appt  Phone Note Call from Patient   Caller: Patient Summary of Call: Pt left a VM-- needs appt with Dr.Tabori.  Initial call taken by: Army Fossa CMA,  November 28, 2009 10:01 AM  Follow-up for Phone Call        Spoke with patient and she said this morning while doing her exercises she noticed that she has some brusing to her inner and outer thighs. She thinks it could possibly be some blood pooling there but is unsure. She also said that she had a couple of mornings last week where she woke up with a slight nose bleed.  I offered her another physican to see today but she declined and wanted to wait to see Dr. Beverely Low tomorrow bc she also wanted to followup from seeing the urologist she was referred to. See is sch'd to come in tomorrow morning @ 11am to see Dr. Beverely Low.  Follow-up by: Harold Barban,  November 28, 2009 10:11 AM  Additional Follow-up for Phone Call Additional follow up Details #1::        noted.  if bleeding becomes suddenly worse she needs to go to the ER Additional Follow-up by: Neena Rhymes MD,  November 28, 2009 11:40 AM    Additional Follow-up for Phone Call Additional follow up Details #2::    Patient is aware of that and said she should be good until tomorrow.  Follow-up by: Harold Barban,  November 28, 2009 11:42 AM

## 2010-03-19 NOTE — Assessment & Plan Note (Signed)
Summary: PAIN IN LEFT SIDE IN THE BACK/KDC   Vital Signs:  Patient profile:   77 year old female Weight:      110 pounds Temp:     98.2 degrees F oral BP sitting:   122 / 78  (left arm)  Vitals Entered By: Doristine Devoid (March 16, 2009 10:48 AM) CC: some L side back pain xwks had fall back in dec.    Primary Care Provider:  Neena Rhymes MD  CC:  some L side back pain xwks had fall back in dec. Marland Kitchen  History of Present Illness: Ms [name] is a 77 year old female who presents with c/o low back pain.  Notes that she slipped in her back yard back in December. Sustained bruising but initially felt fine otherwise.  Gradually started feeling pain in the left low back region.  Pain is aching in nature.  Patient requests an x-ray.  She has taken tylenol wiht some improvement.  Notes that she had a UTI in november.    Allergies: 1)  ! Compazine  Review of Systems       Denies dysuria.  Denies frequency.  +low back pain   Physical Exam  General:  thin white female, NAD Head:  Normocephalic and atraumatic without obvious abnormalities. No apparent alopecia or balding. Lungs:  Normal respiratory effort, chest expands symmetrically. Lungs are clear to auscultation, no crackles or wheezes. Heart:  Normal rate and regular rhythm. S1 and S2 normal without gallop, murmur, click, rub or other extra sounds. Msk:  No deformity or scoliosis noted of thoracic or lumbar spine.   Neurologic:  MAE, no lower extremity weakness noted   Impression & Recommendations:  Problem # 1:  BACK PAIN, LEFT (ICD-724.5) X ray noted DDD and spodylosis.   Continue mobic, as needed tylenol, heat.  Consider PT eval if no improvement with these measures.  Will send urine for culture, dip negative however.   Her updated medication list for this problem includes:    Mobic 15 Mg Tabs (Meloxicam) .Marland Kitchen... Take one tablet daily   Orders: UA Dipstick w/o Micro (manual) (95621) T-Lumbar Spine 2 Views (72100TC) Specimen  Handling (99000) T-Culture, Urine ([id number])  Complete Medication List: 1)  Plaquenil 200 Mg Tabs (Hydroxychloroquine sulfate) .... Take two times a day 2)  Mobic 15 Mg Tabs (Meloxicam) .... Take one tablet daily 3)  Biotin 1000 Mcg Tabs (Biotin) .... Two times a day 4)  Nasonex 50 Mcg/act Susp (Mometasone furoate) .... 2 sprays each nostril once daily 5)  Vitamin C 500 Mg Tabs (Ascorbic acid) .... Take one tablet by mouth once daily. 6)  Multivitamins Tabs (Multiple vitamin) .... Take one tablet by mouth once daily. 7)  Vitamin D 1000 Unit Tabs (Cholecalciferol) .Marland Kitchen.. 1 tab once daily 8)  Fish Oil Oil (Fish oil) .... Take one tablet by mouth once daily. 9)  Aspirin 81 Mg Tbec (Aspirin) .... Take one tablet by mouth daily 10)  Glucosamine-chondroitin 1500-1200 Mg/70ml Liqd (Glucosamine-chondroitin) .Marland Kitchen.. 1 tab once daily 11)  Calcium-vitamin D 500-200 Mg-unit Tabs (Calcium carbonate-vitamin d) .... 2 tabs once daily 12)  Stool Softener 100 Mg Caps (Docusate sodium) .Marland Kitchen.. 1 cap two times a day  Patient Instructions: 1)  Please complete your xray today.  It can be completed at the [facility] on [address]   2)  Take 650-1000mg  of Tylenol every 4-6 hours as needed for relief of pain or comfort of fever AVOID taking more than 4000mg   in a 24 hour period (can cause liver damage in higher doses). 3)   Keep active but avoid activities that are painful. Apply moist heat  to lower back several times a day. 4)  Please call for follow up if your symptoms are not resolved in 1 month, sooner if your symptoms worsen.    Laboratory Results   Urine Tests    Routine Urinalysis   Glucose: negative   (Normal Range: Negative) Bilirubin: negative   (Normal Range: Negative) Ketone: negative   (Normal Range: Negative) Spec. Gravity: 1.015   (Normal Range: 1.003-1.035) Blood: negative   (Normal Range: Negative) pH: 6.0   (Normal Range: 5.0-8.0) Protein: negative   (Normal Range:  Negative) Urobilinogen: 0.2   (Normal Range: 0-1) Nitrite: negative   (Normal Range: Negative) Leukocyte Esterace: negative   (Normal Range: Negative)

## 2010-03-19 NOTE — Consult Note (Signed)
Summary: [facility]  [facility]   Imported By: Lanelle Bal 10/11/2009 09:18:16  _____________________________________________________________________  External Attachment:    Type:   Image     Comment:   External Document

## 2010-03-19 NOTE — Progress Notes (Signed)
Summary: [name] XRAY REPORT--FAXED 1/31  Phone Note Call from Patient   Caller: Patient Summary of Call: PATIENT DROPPED OFF REQUEST FROM [facility] TO FAX OVER LUMBAR VIEWS FROM DEC 2010 TO FAX NUMBER [phone] TO DR Tilman Neat  TOOK TO [place] IN [facility] [facility] Initial call taken by: Jerolyn Shin,  March 19, 2009 10:11 AM  Follow-up for Phone Call        PAPERWORK BROUGHT BACK UP TO ME--IT HAS BEEN FAXED SUCESSFULLY LATE AFTERNOON TODAY Follow-up by: Jerolyn Shin,  March 19, 2009 5:39 PM

## 2010-03-19 NOTE — Assessment & Plan Note (Signed)
Summary: ?brusing? to thighs//lch  Flu Vaccine Consent Questions     Do you have a history of severe allergic reactions to this vaccine? no    Any prior history of allergic reactions to egg and/or gelatin? no    Do you have a sensitivity to the preservative Thimersol? no    Do you have a past history of Guillan-Barre Syndrome? no    Do you currently have an acute febrile illness? no    Have you ever had a severe reaction to latex? no    Vaccine information given and explained to patient? yes    Are you currently pregnant? no    Lot Number:[id number]   Exp Date:08/17/2010   Site Given  Left Deltoid IM    Vital Signs:  Patient profile:   77 year old female Weight:      110 pounds BMI:     18.80 Pulse rate:   82 / minute BP sitting:   110 / 74  (left arm)  Vitals Entered By: Doristine Devoid CMA (November 29, 2009 10:59 AM) CC: Bruising-noticed spots on legs, having intermittent nosebleeds, and feet feels like pin needles   History of Present Illness: 77 yo woman here today for  1) bruising- noticed while doing her exercises Monday.  no pain.  not on ASA.  on Mobic.  on LEs bilaterally, small area on L groin.  2) frequent nose bleeds- reports she will need to pack her nose 2-3x prior to controlling the bleeding.  having nose bleeds in the mornings.  wil have 2-3/week.  struggled w/ this last winter.  sxs returned 2 weeks ago as the weather changed.  sxs improved last year w/ humidifier use.  Current Medications (verified): 1)  Plaquenil 200 Mg  Tabs (Hydroxychloroquine Sulfate) .... Take Two Times A Day 2)  Mobic 15 Mg  Tabs (Meloxicam) .... Take One Tablet Daily 3)  Biotin 1000 Mcg Tabs (Biotin) .... Two Times A Day 4)  Vitamin C 500 Mg  Tabs (Ascorbic Acid) .... Take One Tablet By Mouth Once Daily. 5)  Multivitamins   Tabs (Multiple Vitamin) .... Take One Tablet By Mouth Once Daily. 6)  Vitamin D 1000 Unit Tabs (Cholecalciferol) .Marland Kitchen.. 1 Tab Once Daily 7)  Glucosamine-Chondroitin  1500-1200 Mg/70ml Liqd (Glucosamine-Chondroitin) .Marland Kitchen.. 1 Tab Once Daily 8)  Calcium-Vitamin D 500-200 Mg-Unit Tabs (Calcium Carbonate-Vitamin D) .... Take 1 Tablet By Mouth Once A Day 9)  Stool Softener 100 Mg Caps (Docusate Sodium) .Marland Kitchen.. 1 Cap Two Times A Day 10)  Estrogen Cream .... Two Times A Weekly- Name Unknown  Allergies (verified): 1)  ! Compazine 2)  ! Aspirin (Aspirin)  Review of Systems      See HPI  Physical Exam  General:  thin white female, NAD Nose:  no external deformity, no nasal discharge, no airflow obstruction, no intranasal foreign body, no nasal polyps, and no active bleeding or clots- but scabs visible superficially in each nostril   Skin:  linear bruising along lower shins bilaterally- apparently along sock line. resolving bruise on L upper thigh   Impression & Recommendations:  Problem # 1:  CONTUSION OF LOWER LEG (ICD-924.10) Assessment New PT normal.  bruising is linear along lower legs bilaterally- apparent sock line.  pt not on ASA.  on mobic daily.  no cause for concern at this time.  will follow. Orders: Protime (08657QI) Specimen Handling (69629)  Problem # 2:  EPISTAXIS (ICD-784.7) Assessment: New PT normal.  check CBC to r/o excessive blood  loss.  most likely due to dry heat- pt to start using humidifier again.  discussed use of nasal saline.  will follow. Orders: Venipuncture (14782) Protime ([id number]) Specimen Handling (86578) TLB-CBC Platelet - w/Differential (85025-CBCD)  Complete Medication List: 1)  Plaquenil 200 Mg Tabs (Hydroxychloroquine sulfate) .... Take two times a day 2)  Mobic 15 Mg Tabs (Meloxicam) .... Take one tablet daily 3)  Biotin 1000 Mcg Tabs (Biotin) .... Two times a day 4)  Vitamin C 500 Mg Tabs (Ascorbic acid) .... Take one tablet by mouth once daily. 5)  Multivitamins Tabs (Multiple vitamin) .... Take one tablet by mouth once daily. 6)  Vitamin D 1000 Unit Tabs (Cholecalciferol) .Marland Kitchen.. 1 tab once daily 7)   Glucosamine-chondroitin 1500-1200 Mg/72ml Liqd (Glucosamine-chondroitin) .Marland Kitchen.. 1 tab once daily 8)  Calcium-vitamin D 500-200 Mg-unit Tabs (Calcium carbonate-vitamin d) .... Take 1 tablet by mouth once a day 9)  Stool Softener 100 Mg Caps (Docusate sodium) .Marland Kitchen.. 1 cap two times a day 10)  Estrogen Cream  .... Two times a weekly- name unknown  Other Orders: Flu Vaccine 28yrs + MEDICARE PATIENTS (I6962) Administration Flu vaccine - MCR (X5284)  Patient Instructions: 1)  Schedule your physical at your convenience- do not eat before this appt 2)  The bruising appears to be related to tight socks- be mindful of how tight things are since you have a tendency to bruise 3)  Start using the humidifier to avoid nasal dryness 4)  Add a saline nasal spray to keep the nose moist 5)  If you have a bleed, use the neosynephrine and then pack as needed 6)  We'll notify you of your lab results 7)  Hang in there!!  Laboratory Results   Blood Tests      INR: 1.0   (Normal Range: 0.88-1.12   Therap INR: 2.0-3.5)

## 2010-03-21 NOTE — Assessment & Plan Note (Signed)
Summary: yearly exam and fasting labs///sph   Vital Signs:  Patient profile:   77 year old female Height:      64 inches Weight:      109 pounds BMI:     18.78 Pulse rate:   60 / minute BP sitting:   104 / 62  (left arm)  Vitals Entered By: Doristine Devoid CMA (March 07, 2010 8:39 AM) CC: yearly exam and labs   History of Present Illness: 77 yo woman here today for CPE.  Here for Medicare AWV:  1.   Risk factors based on Past M, S, F history: anemia- recently has been feeling dizzy w/ rapid position changes and leaning forward. Sjogrens- seeing Dr Cardell Peach.  on Plaquenil and Mobic. parasthesia- numbness in feet is worsening.  reports that any socks cause burning pain.  no color change to feet.  has never had nerve conduction studies done.  2.   Physical Activities: exercises every morning 3.   Depression/mood: no current sxs of depression, doing well 4.   Hearing: normal to whispered voice 5.   ADL's: independent 6.   Fall Risk: not at risk 7.   Home Safety: safe at home, lives w/ husband 8.   Height, weight, &visual acuity: see vitals, cataract surgery corrected vision to 20/20 9.   Counseling: provided on healthy diet and regular exercise, living will (in place) 10.   Labs ordered based on risk factors: see A&P 11.           Referral Coordination: UTD on mammogram, no need for pap, UTD on colonoscopy 12.           Care Plan: see A&P 13.           Cognitive Assessment: memory intact, normal linear thought process  Preventive Screening-Counseling & Management  Alcohol-Tobacco     Alcohol drinks/day: <1     Smoking Status: never  Caffeine-Diet-Exercise     Does Patient Exercise: yes      Sexual History:  currently monogamous.        Drug Use:  never.    Current Medications (verified): 1)  Plaquenil 200 Mg  Tabs (Hydroxychloroquine Sulfate) .... Take Two Times A Day 2)  Mobic 15 Mg  Tabs (Meloxicam) .... Take One Tablet Daily 3)  Biotin 1000 Mcg Tabs (Biotin) .... Two  Times A Day 4)  Vitamin C 500 Mg  Tabs (Ascorbic Acid) .... Take One Tablet By Mouth Once Daily. 5)  Multivitamins   Tabs (Multiple Vitamin) .... Take One Tablet By Mouth Once Daily. 6)  Vitamin D 1000 Unit Tabs (Cholecalciferol) .Marland Kitchen.. 1 Tab Once Daily 7)  Glucosamine-Chondroitin 1500-1200 Mg/26ml Liqd (Glucosamine-Chondroitin) .Marland Kitchen.. 1 Tab Once Daily 8)  Calcium-Vitamin D 500-200 Mg-Unit Tabs (Calcium Carbonate-Vitamin D) .... Take 1 Tablet By Mouth Once A Day 9)  Stool Softener 100 Mg Caps (Docusate Sodium) .Marland Kitchen.. 1 Cap Two Times A Day 10)  Estrogen Cream .... Two Times A Weekly- Name Unknown  Allergies (verified): 1)  ! Compazine 2)  ! Aspirin (Aspirin)  Past History:  Past medical, surgical, family and social histories (including risk factors) reviewed, and no changes noted (except as noted below).  Past Medical History: Reviewed history from 04/05/2009 and no changes required. Anemia-iron deficiency Sjogren's - dr Cardell Peach Abnormal ECG with new LBBB 10/10    --Echo 10/10: EF 45-50%    Nuclear stress 11/10: EF 58% no ischemia. fixed septal defect liekly LBBB Raynaud's phenomenon s/p thoracic sympathectomy (1610) Skin lupus  Past Surgical History: Reviewed history from 12/28/2008 and no changes required. appe tonsils total hysterectomy, including ovaries- fibroids thoracic sympathetectomy '65 with ptosis & miosis lt eye L cataract removal  Family History: Reviewed history from 11/29/2007 and no changes required. Mother- died at 70 in a car accident Father- CAD, colon cancer, DM  Social History: Reviewed history from 11/29/2007 and no changes required. married, lives w/ husband.  daughter and grandchild local.  no tobacco, rare social, no drugs  Review of Systems  The patient denies anorexia, fever, weight loss, weight gain, vision loss, decreased hearing, hoarseness, chest pain, syncope, dyspnea on exertion, peripheral edema, prolonged cough, headaches, abdominal pain, melena,  hematochezia, severe indigestion/heartburn, hematuria, suspicious skin lesions, depression, abnormal bleeding, enlarged lymph nodes, and breast masses.    Physical Exam  General:  thin white female, NAD Head:  Normocephalic and atraumatic without obvious abnormalities. No apparent alopecia or balding. Eyes:  No corneal or conjunctival inflammation noted. EOMI. Perrla. Funduscopic exam benign, without hemorrhages, exudates or papilledema. Vision grossly normal. Ears:  External ear exam shows no significant lesions or deformities.  Otoscopic examination reveals clear canals, tympanic membranes are intact bilaterally without bulging, retraction, inflammation or discharge. Hearing is grossly normal bilaterally. Nose:  External nasal examination shows no deformity or inflammation. Nasal mucosa are pink and moist without lesions or exudates. Mouth:  Oral mucosa and oropharynx without lesions or exudates.  Dentures in place Neck:  No deformities, masses, or tenderness noted. Breasts:  No mass, nodules, thickening, tenderness, bulging, retraction, inflamation, nipple discharge or skin changes noted.   Lungs:  Normal respiratory effort, chest expands symmetrically. Lungs are clear to auscultation, no crackles or wheezes. Heart:  Normal rate and regular rhythm. S1 and S2 normal without gallop, murmur, click, rub or other extra sounds. Abdomen:  Bowel sounds positive,abdomen soft and non-tender without masses, organomegaly or hernias noted. Pulses:  +2 carotid, radial, DP Extremities:  No clubbing, cyanosis, edema, or deformity noted with normal full range of motion of all joints.   Neurologic:  alert & oriented X3, strength normal in all extremities, sensation intact to light touch, and gait normal.   Skin:  Intact without suspicious lesions or rashes Cervical Nodes:  No lymphadenopathy noted Axillary Nodes:  No palpable lymphadenopathy Psych:  Cognition and judgment appear intact. Alert and cooperative  with normal attention span and concentration. No apparent delusions, illusions, hallucinations   Impression & Recommendations:  Problem # 1:  HEALTHY ADULT FEMALE (ICD-V70.0) Assessment Unchanged  pt's PE WNL.  UTD on health maintainence.  anticipatory guidance provided.  Orders: Medicare -1st Annual Wellness Visit [phone])  Problem # 2:  ANEMIA-IRON DEFICIENCY (ICD-280.9) Assessment: Unchanged has been feeling dizzy w/ position changes and leaning forward.  recheck labs and make sure her counts are ok.  encouraged her to increase her fluid intake. Orders: Venipuncture (09811) TLB-CBC Platelet - w/Differential (85025-CBCD) Specimen Handling (91478)  Problem # 3:  PARESTHESIA (ICD-782.0) Assessment: New having numbness and burning of feet bilaterally.  this may be related to her sjogren's/raynauds but we discussed nerve conduction study to evaluate.  pt would prefer to have this done at Dr Dillard Essex office if possible.  has f/u w/ him and will discuss this at that visit. Orders: TLB-B12 + Folate Pnl ([id number]/ION)  Problem # 4:  DIZZINESS (ICD-780.4) Assessment: Unchanged check labs.  may be related to orthostasis.  encouraged increased fluid intake and changing position slowly. Orders: TLB-TSH (Thyroid Stimulating Hormone) (84443-TSH) TLB-BMP (Basic Metabolic Panel-BMET) (80048-METABOL) Specimen Handling (62952)  Complete Medication List: 1)  Plaquenil 200 Mg Tabs (Hydroxychloroquine sulfate) .... Take two times a day 2)  Mobic 15 Mg Tabs (Meloxicam) .... Take one tablet daily 3)  Biotin 1000 Mcg Tabs (Biotin) .... Two times a day 4)  Vitamin C 500 Mg Tabs (Ascorbic acid) .... Take one tablet by mouth once daily. 5)  Multivitamins Tabs (Multiple vitamin) .... Take one tablet by mouth once daily. 6)  Vitamin D 1000 Unit Tabs (Cholecalciferol) .Marland Kitchen.. 1 tab once daily 7)  Glucosamine-chondroitin 1500-1200 Mg/81ml Liqd (Glucosamine-chondroitin) .Marland Kitchen.. 1 tab once daily 8)   Calcium-vitamin D 500-200 Mg-unit Tabs (Calcium carbonate-vitamin d) .... Take 1 tablet by mouth once a day 9)  Stool Softener 100 Mg Caps (Docusate sodium) .Marland Kitchen.. 1 cap two times a day 10)  Estrogen Cream  .... Two times a weekly- name unknown  Other Orders: TLB-Hepatic/Liver Function Pnl (80076-HEPATIC) TLB-Lipid Panel (80061-LIPID) T-Vitamin D (25-Hydroxy) (84166-06301)  Patient Instructions: 1)  Follow up in 1 year or as needed- you look great! 2)  We'll notify you of your lab results 3)  Call with any questions or concerns 4)  We'll send your lab results and a copy of today's visit to Dr Cardell Peach- that way you can discuss nerve conduction testing w/ him 5)  Happy New Year!   Orders Added: 1)  Venipuncture [36415] 2)  TLB-CBC Platelet - w/Differential [85025-CBCD] 3)  TLB-TSH (Thyroid Stimulating Hormone) [84443-TSH] 4)  TLB-Hepatic/Liver Function Pnl [80076-HEPATIC] 5)  TLB-Lipid Panel [80061-LIPID] 6)  TLB-BMP (Basic Metabolic Panel-BMET) [80048-METABOL] 7)  T-Vitamin D (25-Hydroxy) [60109-32355] 8)  TLB-B12 + Folate Pnl [82746_82607-B12/FOL] 9)  Specimen Handling [99000] 10)  Medicare -1st Annual Wellness Visit [G0438] 11)  Est. Patient Level III [73220]

## 2010-03-27 NOTE — Letter (Signed)
Summary: [facility]  [facility]   Imported By: Lanelle Bal 03/22/2010 09:45:59  _____________________________________________________________________  External Attachment:    Type:   Image     Comment:   External Document

## 2010-04-30 ENCOUNTER — Encounter: Payer: Self-pay | Admitting: Family Medicine

## 2010-04-30 ENCOUNTER — Ambulatory Visit (INDEPENDENT_AMBULATORY_CARE_PROVIDER_SITE_OTHER): Payer: Medicare Other | Admitting: Family Medicine

## 2010-04-30 DIAGNOSIS — J019 Acute sinusitis, unspecified: Secondary | ICD-10-CM

## 2010-05-07 LAB — DIFFERENTIAL
Basophils Absolute: 0 10*3/uL (ref 0.0–0.1)
Lymphocytes Relative: 7 % — ABNORMAL LOW (ref 12–46)
Lymphs Abs: 0.6 10*3/uL — ABNORMAL LOW (ref 0.7–4.0)
Neutro Abs: 7.4 10*3/uL (ref 1.7–7.7)
Neutrophils Relative %: 88 % — ABNORMAL HIGH (ref 43–77)

## 2010-05-07 LAB — URINALYSIS, ROUTINE W REFLEX MICROSCOPIC
Bilirubin Urine: NEGATIVE
Glucose, UA: NEGATIVE mg/dL
Hgb urine dipstick: NEGATIVE
Specific Gravity, Urine: 1.021 (ref 1.005–1.030)
pH: 7.5 (ref 5.0–8.0)

## 2010-05-07 LAB — LIPASE, BLOOD: Lipase: 128 U/L (ref 23–300)

## 2010-05-07 LAB — CBC
Hemoglobin: 11.4 g/dL — ABNORMAL LOW (ref 12.0–15.0)
MCHC: 34.8 g/dL (ref 30.0–36.0)
MCV: 89.2 fL (ref 78.0–100.0)
RBC: 3.69 MIL/uL — ABNORMAL LOW (ref 3.87–5.11)
WBC: 8.4 10*3/uL (ref 4.0–10.5)

## 2010-05-07 LAB — COMPREHENSIVE METABOLIC PANEL
BUN: 21 mg/dL (ref 6–23)
CO2: 27 mEq/L (ref 19–32)
Calcium: 9.2 mg/dL (ref 8.4–10.5)
Creatinine, Ser: 0.8 mg/dL (ref 0.4–1.2)
GFR calc Af Amer: >60 mL/min (ref >60)
GFR calc non Af Amer: >60 mL/min (ref >60)
Glucose, Bld: 109 mg/dL — ABNORMAL HIGH (ref 70–99)
Total Bilirubin: 0.4 mg/dL (ref 0.3–1.2)

## 2010-05-07 NOTE — Assessment & Plan Note (Signed)
Summary: sore throat, congested/cbs   Vital Signs:  Patient profile:   77 year old female Height:      64 inches (162.56 cm) Weight:      110.50 pounds (50.23 kg) BMI:     19.04 Temp:     97.2 degrees F (36.22 degrees C) oral BP sitting:   100 / 50  (left arm) Cuff size:   regular  Vitals Entered By: Lucious Groves CMA (April 30, 2010 11:48 AM) CC: Possible URI/sinus inf./kb Is Patient Diabetic? No Comments Patient notes that she has been having body aches, HA, cough, mucous production (color unknown), and SOB. Patient denies fever, and chest pain.   History of Present Illness: 77 yo woman here today  for ? sinus infxn.  sxs started 2 weeks ago.  initially thought she had a cold but sxs have been worsening.  'i hurt from here (below breasts)- up'.  + nasal congestion, facial pain/pressure, dull HA, bilateral ear fullness, sore throat.  + dry, 'hacking' cough.  no fevers.  difficulty breathing when lying flat.  Current Medications (verified): 1)  Plaquenil 200 Mg  Tabs (Hydroxychloroquine Sulfate) .... Take Two Times A Day 2)  Mobic 15 Mg  Tabs (Meloxicam) .... Take One Tablet Daily 3)  Biotin 1000 Mcg Tabs (Biotin) .... Two Times A Day 4)  Vitamin C 500 Mg  Tabs (Ascorbic Acid) .... Take One Tablet By Mouth Once Daily. 5)  Multivitamins   Tabs (Multiple Vitamin) .... Take One Tablet By Mouth Once Daily. 6)  Vitamin D 1000 Unit Tabs (Cholecalciferol) .Marland Kitchen.. 1 Tab Once Daily 7)  Glucosamine-Chondroitin 1500-1200 Mg/70ml Liqd (Glucosamine-Chondroitin) .Marland Kitchen.. 1 Tab Once Daily 8)  Calcium-Vitamin D 500-200 Mg-Unit Tabs (Calcium Carbonate-Vitamin D) .... Take 1 Tablet By Mouth Once A Day 9)  Stool Softener 100 Mg Caps (Docusate Sodium) .Marland Kitchen.. 1 Cap Two Times A Day 10)  Estrogen Cream .... Two Times A Weekly- Name Unknown  Allergies (verified): 1)  ! Compazine 2)  ! Aspirin (Aspirin)  Review of Systems      See HPI  Physical Exam  General:  thin white female, NAD Head:  + TTP over frontal  and maxillary sinuses Eyes:  no injxn or inflammation Ears:  External ear exam shows no significant lesions or deformities.  Otoscopic examination reveals clear canals, tympanic membranes are intact bilaterally without bulging, retraction, inflammation or discharge. Hearing is grossly normal bilaterally. Nose:  External nasal examination shows no deformity or inflammation. Nasal mucosa are pink and moist without lesions or exudates. Mouth:  Oral mucosa and oropharynx without lesions or exudates.  Dentures in place Neck:  No deformities, masses, or tenderness noted. Lungs:  Normal respiratory effort, chest expands symmetrically. Lungs are clear to auscultation, no crackles or wheezes.  + dry cough Heart:  Normal rate and regular rhythm. S1 and S2 normal without gallop, murmur, click, rub or other extra sounds.   Impression & Recommendations:  Problem # 1:  SINUSITIS - ACUTE-NOS (ICD-461.9) Assessment New  pt's sxs and PE consistent w/ infxn.  start abx.  cough meds as needed.  reviewed supportive care and red flags that should prompt return.  Pt expresses understanding and is in agreement w/ this plan. Her updated medication list for this problem includes:    Amoxicillin 875 Mg Tabs (Amoxicillin) .Marland Kitchen... 1 tab by mouth two times a day x10 days.  take w/ food.    Tessalon 200 Mg Caps (Benzonatate) .Marland Kitchen... Take one capsule by mouth three times a day  as needed for cough    Cheratussin Ac 100-10 Mg/60ml Syrp (Guaifenesin-codeine) .Marland Kitchen... 1-2 tsps q4-6 as needed for cough  Orders: Prescription Created Electronically [phone])  Complete Medication List: 1)  Plaquenil 200 Mg Tabs (Hydroxychloroquine sulfate) .... Take two times a day 2)  Mobic 15 Mg Tabs (Meloxicam) .... Take one tablet daily 3)  Biotin 1000 Mcg Tabs (Biotin) .... Two times a day 4)  Vitamin C 500 Mg Tabs (Ascorbic acid) .... Take one tablet by mouth once daily. 5)  Multivitamins Tabs (Multiple vitamin) .... Take one tablet by mouth once  daily. 6)  Vitamin D 1000 Unit Tabs (Cholecalciferol) .Marland Kitchen.. 1 tab once daily 7)  Glucosamine-chondroitin 1500-1200 Mg/75ml Liqd (Glucosamine-chondroitin) .Marland Kitchen.. 1 tab once daily 8)  Calcium-vitamin D 500-200 Mg-unit Tabs (Calcium carbonate-vitamin d) .... Take 1 tablet by mouth once a day 9)  Stool Softener 100 Mg Caps (Docusate sodium) .Marland Kitchen.. 1 cap two times a day 10)  Estrogen Cream  .... Two times a weekly- name unknown 11)  Amoxicillin 875 Mg Tabs (Amoxicillin) .Marland Kitchen.. 1 tab by mouth two times a day x10 days.  take w/ food. 12)  Tessalon 200 Mg Caps (Benzonatate) .... Take one capsule by mouth three times a day as needed for cough 13)  Cheratussin Ac 100-10 Mg/28ml Syrp (Guaifenesin-codeine) .Marland Kitchen.. 1-2 tsps q4-6 as needed for cough  Patient Instructions: 1)  You have a sinus infection 2)  Take the Amoxicillin as directed- take w/ food to avoid upset stomach 3)  Use the cough meds as needed- the pills won't make you sleepy and the syrup will 4)  Continue the tylenol as needed for pain and fever 5)  Drink plenty of fluids 6)  REST! 7)  Call with any questions or concerns 8)  Hang in there!!! Prescriptions: CHERATUSSIN AC 100-10 MG/5ML SYRP (GUAIFENESIN-CODEINE) 1-2 tsps Q4-6 as needed for cough  #150 x 0   Entered and Authorized by:   Neena Rhymes MD   Signed by:   Neena Rhymes MD on 04/30/2010   Method used:   Print then Give to Patient   RxID:   [id number] TESSALON 200 MG CAPS (BENZONATATE) Take one capsule by mouth three times a day as needed for cough  #60 x 0   Entered and Authorized by:   Neena Rhymes MD   Signed by:   Neena Rhymes MD on 04/30/2010   Method used:   Electronically to        [facility] [phone]* (retail)       [address]       [place], [place]  [address]       Ph: [phone]       Fax: [phone]   RxID:   [id number] AMOXICILLIN 875 MG TABS (AMOXICILLIN) 1 tab by mouth two times a day x10 days.  take w/  food.  #20 x 0   Entered and Authorized by:   Neena Rhymes MD   Signed by:   Neena Rhymes MD on 04/30/2010   Method used:   [facility]ronically to        [facility] [phone]* (retail)       [address]       [place], [place]  [address]       Ph: [phone]       Fax: [phone]   RxID:   [id number]    Orders Added: 1)  Est. Patient Level III [16109] 2)  Prescription Created Electronically [phone]

## 2010-05-22 LAB — GLUCOSE, CAPILLARY: Glucose-Capillary: 87 mg/dL (ref 70–99)

## 2010-05-23 LAB — COMPREHENSIVE METABOLIC PANEL
ALT: 20 U/L (ref 0–35)
AST: 30 U/L (ref 0–37)
Albumin: 3.5 g/dL (ref 3.5–5.2)
CO2: 26 mEq/L (ref 19–32)
Calcium: 8.5 mg/dL (ref 8.4–10.5)
Chloride: 99 mEq/L (ref 96–112)
Creatinine, Ser: 0.73 mg/dL (ref 0.4–1.2)
GFR calc Af Amer: >60 mL/min (ref >60)
GFR calc non Af Amer: >60 mL/min (ref >60)
Sodium: 130 mEq/L — ABNORMAL LOW (ref 135–145)

## 2010-05-23 LAB — URINE CULTURE

## 2010-05-23 LAB — DIFFERENTIAL
Eosinophils Absolute: 0 10*3/uL (ref 0.0–0.7)
Eosinophils Relative: 0 % (ref 0–5)
Lymphocytes Relative: 15 % (ref 12–46)
Lymphs Abs: 0.6 10*3/uL — ABNORMAL LOW (ref 0.7–4.0)
Monocytes Absolute: 0.3 10*3/uL (ref 0.1–1.0)

## 2010-05-23 LAB — URINALYSIS, ROUTINE W REFLEX MICROSCOPIC
Bilirubin Urine: NEGATIVE
Glucose, UA: NEGATIVE mg/dL
Hgb urine dipstick: NEGATIVE
Ketones, ur: NEGATIVE mg/dL
Specific Gravity, Urine: 1.02 (ref 1.005–1.030)
pH: 8 (ref 5.0–8.0)

## 2010-05-23 LAB — POCT CARDIAC MARKERS
CKMB, poc: 1.1 ng/mL (ref 1.0–8.0)
Troponin i, poc: <0.05 ng/mL (ref 0.00–0.09)

## 2010-05-23 LAB — URINE MICROSCOPIC-ADD ON

## 2010-05-23 LAB — LIPASE, BLOOD: Lipase: 35 U/L (ref 11–59)

## 2010-05-23 LAB — CBC
Platelets: 137 10*3/uL — ABNORMAL LOW (ref 150–400)
RBC: 3.54 MIL/uL — ABNORMAL LOW (ref 3.87–5.11)
WBC: 4.2 10*3/uL (ref 4.0–10.5)

## 2010-07-05 NOTE — Op Note (Signed)
[facility]  Patient:    [name], [name] Visit Number: [id number] MRN: [id number]          Service Type: DSU Location: [facility] 2899 [id number] Attending Physician:  Tommy Medal Dictated by:   Doris Cheadle Dione Booze, M.D. Proc. Date: 04/20/01 Admit Date:  04/20/2001                             Operative Report  INDICATIONS AND JUSTIFICATIONS FOR THE PROCEDURE:  The patient was seen through the referral of Dr. Estrella Deeds for evaluation of a ptosis.  She is actually seen on a rather regular basis for visual fields for Plaquenil use. Examination shows that she has 20/25 vision and the pressure is 13 in each eye.  The pupils, motility, conjunctiva, cornea, anterior chamber and dilated fundus exam were negative except she is a mild glaucoma suspect with moderate optic nerve cupping.  She also has early cataracts.  Finally, she has a definite 2 or 3 mm left ptosis such that the left upper lid covers the pupil and interferes with the upper field of vision.  This bothers her with her daily activities and visual field testing shows about a 20 degree loss of the superior field caused by the ptosis.  She would like to have this repaired. Medically she should be stable.  JUSTIFICATION FOR PERFORMING PROCEDURE IN OUTPATIENT SETTING:  Routine.  JUSTIFICATION FOR OVERNIGHT STAY:  None.  PREOPERATIVE DIAGNOSIS:  Left ptosis.  POSTOPERATIVE DIAGNOSIS:  Left ptosis.  OPERATION PERFORMED:  Left ptosis repair.  SURGEON:  Robert L. Dione Booze, M.D.  ANESTHESIA:  1% Xylocaine with epinephrine.  DESCRIPTION OF PROCEDURE:  The patient arrived in the minor surgery room at [facility] and was prepped and draped in the routine fashion.  A frontal lid block consisting of 1% Xylocaine and epinephrine was given on the left.  The lid was everted and two curved hemostats were clamped over the tarsal margin at the superior margin to clamp about 1.5 mm of  the tissue.  Stab incision was made temporally and a suture was run back and forth behind this hemostat nasally.  The hemostats were removed and the tissue that they had held was excised.  Next, the suture was run back to the original starting point and buried so that it would not scratch the cornea.  Polysporin ointment was used and the eye was patched.  The patient then left the minor room having done well.  FOLLOW-UP:  The patient will be seen in my office in about a week to see how she is doing.  She is to call if the eye causes pain. Dictated by:   Doris Cheadle. Dione Booze, M.D. Attending Physician:  Tommy Medal DD:  04/20/01 TD:  04/20/01 Job: [id number]

## 2010-07-05 NOTE — H&P (Signed)
[name], [name]                        ACCOUNT NO.:  [id number]   MEDICAL RECORD NO.:  [id number]                   PATIENT TYPE:  INP   LOCATION:  5154                                 FACILITY:  [facility]   PHYSICIAN:  Vania Rea, M.D.              DATE OF BIRTH:  [birth year]   DATE OF ADMISSION:  03/13/2003  DATE OF DISCHARGE:                                HISTORY & PHYSICAL   PRIMARY CARE PHYSICIAN:  Dr. Domingo Mend in [place].   CHIEF COMPLAINT:  Abdominal pain, rectal bleeding, constipation.   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 The patient had a history of infectious gastroenteritis two weeks  ago, both herself and her family caught this bug. She had nausea, vomiting,  and diarrhea, but this resolved. Since the resolution of her gastroenteritis  she had not felt completely herself, but she resumed having her regular  daily bowel movement. On Thursday she had a small bowel movement and since  then she has been having straining at the stool, but no stool coming. She  has now developed a colicky lower abdominal pain radiating to her back. This  morning while sitting on the stool trying to defecate she had drops of blood  in the toilet bowel instead of stool.   The patient had a colonoscopy three years ago done by her primary physician  in [place]. She reports this result as benign polyps and internal  hemorrhoids. No evidence of malignancy.   The patient's father died of colon cancer at age 80.   The patient denies fever, cough, cold, or shortness of breath. The patient  denies nausea or vomiting. The patient denies chest pain or syncope.   PAST MEDICAL HISTORY:  1. Discoid lupus.  2.  Raynaud's phenomenon.  3. Diagnosed some years ago with spastic colon.  4. Arthritis.   PAST SURGICAL HISTORY:  1. Remote history of left thoracosympathectomy for Raynaud's phenomenon.  2. Colonoscopy at the end of 2003 or beginning 2004.   MEDICATIONS:  1. Plaquenil unknown dosage twice daily.  2. Ibuprofen over-the-counter 400 mg three times daily.  3. Glucosamine chondroitin over-the-counter.  4. Calcium with D daily.  5. Multivitamins daily.  6. Herbal cascara at bedtime.   ALLERGIES:  COMPAZINE some years ago caused swelling of the tongue and  difficulty breathing.   SOCIAL HISTORY:  She lives in [place] with her second husband of 20 years. She  has two daughters who are very supportive. She denies tobacco, alcohol, or  drug abuse. She has an occasional drink of wine.   FAMILY HISTORY:  Father died of colon cancer. Mother lived until  age 21 when  she died of a motor vehicle accident. She does have a history of diabetes in  one of her relatives, but aside from that there are no known serious medical  problems in all of her four siblings, two of which are still alive or her  two children.   REVIEW OF SYSTEMS:  She works out regularly. She is in [place] at the  moment because she was babysitting for her daughter and she was able to walk  up the stairs with a 28 pound toddler in her arms without difficulty. Review  of systems is otherwise noncontributory.   PHYSICAL EXAMINATION:  GENERAL:  An elderly Caucasian lady lying on the  stretcher in no distress. She is lying flat without shortness of breath.  HEENT:  Mucous membranes do appear pale and she is mildly dehydrated. There  is no icterus.  VITAL SIGNS:  Temperature 97.5, blood pressure 106/57, pulse 75,  respirations 20, saturation 98% on room air.  CHEST:  Clear to auscultation bilaterally.  CARDIOVASCULAR:  Regular rhythm, no murmurs.  ABDOMEN:  She is tender in the lower quadrant and there is stool felt  through the  abdomen. In the left upper quadrant and right upper quadrant  there are no masses.  EXTREMITIES:  No edema.  CNS:  She is alert and oriented x3.  RECTAL:  The rectum is filled with soft, greenish stool and it is Guaiac  positive. No masses were felt.   ASSESSMENT:  This is an elderly lady with a history of discoid lupus and  Raynaud's presenting with abdominal pain, constipation and found to be  fecally impacted.   PLAN:  We will admit her for stat soap and water enema and then get a CAT  scan of her abdomen. Stool is hemoccult positive, but she does have a  history of hemorrhoids. If the CAT scan is negative and she is feeling well  the best option may be to discharge her back to her regular physicians, or  depending on her status we may hold her for a colonoscopy.                                                Vania Rea, M.D.    LC/MEDQ  D:  03/13/2003  T:  03/14/2003  Job:  [id number]

## 2010-07-05 NOTE — Discharge Summary (Signed)
[name], [name]                        ACCOUNT NO.:  [id number]   MEDICAL RECORD NO.:  [id number]                   PATIENT TYPE:  INP   LOCATION:  5154                                 FACILITY:  [facility]   PHYSICIAN:  Elliot Cousin, M.D.                 DATE OF BIRTH:  1931-01-28   DATE OF ADMISSION:  03/13/2003  DATE OF DISCHARGE:  03/16/2003                                 DISCHARGE SUMMARY   DISCHARGE DIAGNOSES:  1. Rectosigmoid colitis.  2. Constipation.  3. History of discoid lupus.  4. Raynaud's phenomenon status post left thoracosympathectomy.  5. History of spastic colon.  6. Osteoarthritis.   DISCHARGE MEDICATIONS:  1. Cipro 500 mg b.i.d. for 7 additional days.  2. Flagyl 500 mg b.i.d. for 7 additional days.  3. Plaquenil at previous dose twice daily.  4. Senokot-S one tablet two to three times daily as needed.  5. Centrum Silver one tablet daily.  6. Vicodin 5 mg q.6h. as needed for pain.  7. Tylenol 650 mg one tablet every 4 hours as needed for pain and fever.   DISCHARGE DISPOSITION:  The patient was discharged to home on March 16, 2003 in improved and stable condition.  She was advised to follow up with  her primary care physician Dr. Ailene Ravel in 1 week.   PROCEDURES PERFORMED:  CT scan of the abdomen and pelvis.  The results  revealed bowel wall thickening associated with the rectosigmoid colon.  Otherwise negative CT of the pelvis.  CT of the abdomen completely normal.   HISTORY OF PRESENT ILLNESS:  [name] is a 77 year old lady with a  history of discoid lupus who presented to the emergency department with the  chief complaint of the inability to pass her stool.  The patient takes  herbal cascara on a daily basis to assist with her bowel movements.  The  patient had a history of infectious gastroenteritis 2 weeks ago, both she  and her family caught a bug.  She had nausea, vomiting and diarrhea which  had resolved prior to presentation to the  emergency department.  Since the  resolution of the gastroenteritis she had not felt completely herself but  she resumed having her regular daily bowel movement.  On the day prior to  admission she had some difficulty with evacuating her rectum.  She was  straining quite a bit with no stool coming out.  She developed a colicky  lower abdominal pain radiating to her back.  On the morning of admission she  tried to defecate again however, was straining.  She saw a few drops of  blood in the toilet bowl instead of the stool.  When the patient wiped she  saw a little red blood on the toilet tissue.  The patient stated that she  had a colonoscopy 3 years ago done by her primary care physician in [place].  She reported that  she had a history of a benign polyps and internal  hemorrhoids.  The patient was admitted for further evaluation of  constipation and lower abdominal pain.   HOSPITAL COURSE:  RECTOSIGMOID COLITIS.  The patient as stated above  presented with constipation.  An abdominal film was ordered for initial  evaluation in the emergency department.  The abdominal film revealed  nonobstructive bowel-gas pattern and mild constipation in the distal colon.  The patient did have quite a bit of stool in her rectum per digital exam.  The stool was greenish in color and was guaiac positive.  For further  evaluation of the abdominal pain CT scans of the abdomen and pelvis were  ordered.  The CT scan of the abdomen was completely normal.  However, the CT  scan of the pelvis revealed bowel wall thickening associated with the  rectosigmoid colon.  The patient was treated with soap suds enemas and  lactulose q.i.d. for the first 24 hours of hospitalization.  The patient was  evacuated adequately with this regimen.  She was started on treatment for  the colitis with Flagyl 800 mg IV q.8h. and Cipro 400 mg IV q.12h.  She was  initially afebrile in the emergency department however, prior to starting   the antibiotics she became febrile with a fever of 100.5.  Her white blood  cell count on admission was 15.9 however, the followup white blood cell  count increased to 20.4.  Following antibiotic treatment after 48 hours the  white blood cell count normalized to 4.8.  The patient became completely  afebrile over the previous 24 hours, prior to hospital discharge.  She was  started on a full liquid diet and then progressed to a low residue diet.  She had no complaints of increase in abdominal pain, nausea and vomiting  with the progression of her diet.  She continued to have some loose stools  following the enema and laxative therapy.  On the day of hospital discharge  she had only one small bowel movement.  She denied any gross blood in her  stool at the time of discharge however, the day before she did wipe a little  blood on the toilet paper once again.   Per the patient's history she is due for another colonoscopy next year.  However, given the patient's reason for admission, I believe that the  patient will probably need another colonoscopy in the next 3-6 months.  She  was advised to continue the Flagyl and Cipro for an additional 7 days.  She  was advised to avoid spicy, greasy and high residue foods, particularly with  nuts and seeds.  There was actually no evidence of diverticulosis on CT scan  however, the patient says that she likes spicy foods and occasionally will  eat nuts and seeds.  She was advised to follow up with her primary care  physician Dr. Ailene Ravel in 5-7 days.  Incidentally the patient's hemoglobin  ranged between 10.4 and 11.9 during the hospitalization and the hematocrit  ranged between 30.7 and 36.  She did have a mild drop in her hematocrit and  hemoglobin however, this was felt to be secondary to hemodilution with  aggressive IV fluids in the first 24 hours of hospitalization and less  likely the small amount of GI blood loss.  Elliot Cousin, M.D.    DF/MEDQ  D:  03/16/2003  T:  03/16/2003  Job:  [id number]   cc:   Domingo Mend, M.D.  [place], [place]  Fax [phone]

## 2010-07-25 ENCOUNTER — Emergency Department (HOSPITAL_BASED_OUTPATIENT_CLINIC_OR_DEPARTMENT_OTHER)
Admission: EM | Admit: 2010-07-25 | Discharge: 2010-07-25 | Disposition: A | Discharge status: Home / Self Care | Payer: No Typology Code available for payment source | Attending: Emergency Medicine | Admitting: Emergency Medicine

## 2010-07-25 DIAGNOSIS — Z79899 Other long term (current) drug therapy: Secondary | ICD-10-CM | POA: Insufficient documentation

## 2010-07-25 DIAGNOSIS — S0990XA Unspecified injury of head, initial encounter: Secondary | ICD-10-CM | POA: Insufficient documentation

## 2010-07-25 DIAGNOSIS — S20219A Contusion of unspecified front wall of thorax, initial encounter: Secondary | ICD-10-CM | POA: Insufficient documentation

## 2010-07-25 DIAGNOSIS — Y92410 Unspecified street and highway as the place of occurrence of the external cause: Secondary | ICD-10-CM | POA: Insufficient documentation

## 2010-08-09 ENCOUNTER — Encounter: Payer: Self-pay | Admitting: Family Medicine

## 2010-08-20 ENCOUNTER — Ambulatory Visit (INDEPENDENT_AMBULATORY_CARE_PROVIDER_SITE_OTHER): Payer: Medicare Other | Admitting: Family Medicine

## 2010-08-20 ENCOUNTER — Encounter: Payer: Self-pay | Admitting: Family Medicine

## 2010-08-20 DIAGNOSIS — H6120 Impacted cerumen, unspecified ear: Secondary | ICD-10-CM

## 2010-08-20 NOTE — Patient Instructions (Signed)
Continue to use the over the counter wax drops in the L ear Do not use Qtips- this will push the wax farther in If your symptoms don't improve by early next week- come back and we'll try again I'm so glad you're ok! Have a wonderful holiday!!!

## 2010-08-20 NOTE — Assessment & Plan Note (Signed)
Cerumen was softened by H2O2 and successfully irrigated from R ear.  TM normal.  L ear cerumen was softened and some was removed but large amount remains in canal.  Canal was becoming red, inflammed w/ repeated irrigation so attempts at removal were stopped.  Pt to continue OTC tx's and if no relief by next week return for repeat attempts at removal.  Pt expressed understanding and is in agreement w/ plan.
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
## 2010-10-16 ENCOUNTER — Encounter: Payer: Self-pay | Admitting: Gastroenterology

## 2011-01-06 ENCOUNTER — Other Ambulatory Visit: Payer: Self-pay | Admitting: Family Medicine

## 2011-01-06 ENCOUNTER — Ambulatory Visit (HOSPITAL_BASED_OUTPATIENT_CLINIC_OR_DEPARTMENT_OTHER)
Admission: RE | Admit: 2011-01-06 | Discharge: 2011-01-06 | Disposition: A | Discharge status: Home / Self Care | Payer: Medicare Other | Source: Ambulatory Visit | Attending: Family Medicine | Admitting: Family Medicine

## 2011-01-06 DIAGNOSIS — Z1231 Encounter for screening mammogram for malignant neoplasm of breast: Secondary | ICD-10-CM

## 2011-06-13 ENCOUNTER — Encounter: Payer: Self-pay | Admitting: Family Medicine

## 2011-06-13 ENCOUNTER — Ambulatory Visit (INDEPENDENT_AMBULATORY_CARE_PROVIDER_SITE_OTHER): Payer: Medicare Other | Admitting: Family Medicine

## 2011-06-13 VITALS — BP 118/64 | HR 84 | Temp 98.3°F | Wt 110.0 lb

## 2011-06-13 DIAGNOSIS — J329 Chronic sinusitis, unspecified: Secondary | ICD-10-CM | POA: Insufficient documentation

## 2011-06-13 DIAGNOSIS — H6120 Impacted cerumen, unspecified ear: Secondary | ICD-10-CM

## 2011-06-13 MED ORDER — AMOXICILLIN 875 MG PO TABS: 875.0000 mg | ORAL_TABLET | Freq: Two times a day (BID) | ORAL | Status: AC

## 2011-06-13 NOTE — Assessment & Plan Note (Signed)
New.  Pt's sxs and PE consistent w/ L maxillary sinusitis.  Start abx.  Reviewed temporal arteritis, sxs and possible implications (blindness).  If pt develops any of these needs to call or go to ER.  Pt expressed understanding and is in agreement w/ plan.

## 2011-06-13 NOTE — Assessment & Plan Note (Signed)
Recurrent.  L ear successfully irrigated.  TM WNL.  Pt tolerated procedure well and noted improvement in pain.

## 2011-06-13 NOTE — Progress Notes (Signed)
  Subjective:    Patient ID: [name], female    DOB: 01-03-1931, 78 y.o.   MRN: [id number]  HPI L ear pain- hx of similar, has had gradual worsening but seemed to intensify 1 week ago.  Now w/ sharp pains in L temple.  Occuring intermittently.  Wednesday night developed dull ache in L ear 'like a tooth ache'.  Sharp pains have decreased in frequency and severity.  + TTP over L maxillary sinus   Review of Systems For ROS see HPI     Objective:   Physical Exam  Vitals reviewed. Constitutional: She appears well-developed and well-nourished. No distress.  HENT:  Head: Normocephalic and atraumatic.  Right Ear: Tympanic membrane normal.  Left Ear: Tympanic membrane normal.  Nose: Mucosal edema and rhinorrhea present. Right sinus exhibits no maxillary sinus tenderness and no frontal sinus tenderness. Left sinus exhibits maxillary sinus tenderness and frontal sinus tenderness.  Mouth/Throat: Uvula is midline and mucous membranes are normal. Posterior oropharyngeal erythema present. No oropharyngeal exudate.       L TM obscurred by cerumen, TM WNL s/p wax removal  Eyes: Conjunctivae and EOM are normal. Pupils are equal, round, and reactive to light.  Neck: Normal range of motion. Neck supple.  Cardiovascular: Normal rate, regular rhythm and normal heart sounds.   Pulmonary/Chest: Effort normal and breath sounds normal. No respiratory distress. She has no wheezes.  Lymphadenopathy:    She has no cervical adenopathy.          Assessment & Plan:

## 2011-06-13 NOTE — Patient Instructions (Signed)
Schedule your physical at your convenience Take the Amoxicillin twice daily w/ food for the sinus infection Call if your symptoms change or worsen Hang in there!

## 2011-08-13 ENCOUNTER — Encounter: Payer: Self-pay | Admitting: Family Medicine

## 2011-08-13 ENCOUNTER — Ambulatory Visit (INDEPENDENT_AMBULATORY_CARE_PROVIDER_SITE_OTHER): Payer: Medicare Other | Admitting: Family Medicine

## 2011-08-13 VITALS — BP 100/65 | HR 77 | Temp 98.0°F | Ht 63.75 in | Wt 107.4 lb

## 2011-08-13 DIAGNOSIS — Z0000 Encounter for general adult medical examination without abnormal findings: Secondary | ICD-10-CM | POA: Insufficient documentation

## 2011-08-13 DIAGNOSIS — D509 Iron deficiency anemia, unspecified: Secondary | ICD-10-CM

## 2011-08-13 DIAGNOSIS — M899 Disorder of bone, unspecified: Secondary | ICD-10-CM

## 2011-08-13 DIAGNOSIS — M5430 Sciatica, unspecified side: Secondary | ICD-10-CM

## 2011-08-13 DIAGNOSIS — M949 Disorder of cartilage, unspecified: Secondary | ICD-10-CM

## 2011-08-13 DIAGNOSIS — M5431 Sciatica, right side: Secondary | ICD-10-CM

## 2011-08-13 LAB — HEPATIC FUNCTION PANEL
ALT: 18 U/L (ref 0–35)
AST: 34 U/L (ref 0–37)
Bilirubin, Direct: 0 mg/dL (ref 0.0–0.3)
Total Bilirubin: 0.6 mg/dL (ref 0.3–1.2)
Total Protein: 7.3 g/dL (ref 6.0–8.3)

## 2011-08-13 LAB — TSH: TSH: 4.22 u[IU]/mL (ref 0.35–5.50)

## 2011-08-13 LAB — CBC WITH DIFFERENTIAL/PLATELET
Basophils Relative: 1 % (ref 0.0–3.0)
Eosinophils Absolute: 0 10*3/uL (ref 0.0–0.7)
Hemoglobin: 11.8 g/dL — ABNORMAL LOW (ref 12.0–15.0)
Lymphocytes Relative: 32.3 % (ref 12.0–46.0)
MCHC: 33.5 g/dL (ref 30.0–36.0)
Monocytes Relative: 11.7 % (ref 3.0–12.0)
Neutrophils Relative %: 53.9 % (ref 43.0–77.0)
RBC: 3.93 Mil/uL (ref 3.87–5.11)
WBC: 3.1 10*3/uL — ABNORMAL LOW (ref 4.5–10.5)

## 2011-08-13 LAB — LIPID PANEL
Total CHOL/HDL Ratio: 3
VLDL: 13 mg/dL (ref 0.0–40.0)

## 2011-08-13 LAB — BASIC METABOLIC PANEL
BUN: 18 mg/dL (ref 6–23)
CO2: 29 mEq/L (ref 19–32)
Chloride: 101 mEq/L (ref 96–112)
Potassium: 4.7 mEq/L (ref 3.5–5.1)

## 2011-08-13 LAB — LDL CHOLESTEROL, DIRECT: Direct LDL: 100.9 mg/dL

## 2011-08-13 NOTE — Patient Instructions (Addendum)
Follow up in 1 year or as needed We'll notify you of your lab results We'll set up your bone density for the same time as your mammogram in Nov Call with any questions or concerns Hang in there!  You're doing great!

## 2011-08-13 NOTE — Progress Notes (Signed)
  Subjective:    Patient ID: [name], female    DOB: 06-11-1930, 78 y.o.   MRN: [id number]  HPI Here today for CPE.  Risk Factors: Osteopenia- listed on problem list, due for DEXA.  Would prefer to do at same time as mammo.   Sciatica- has hx of this, is associated w/ scoliosis.  Dr Cardell Peach (Rheum) indicates that scoliosis is slightly worsening and pt notes that sciatica is also worsening.  On Mobic. Anemia- hx of this, pt reports some increased fatigue recently and would like CBC. Physical Activity: exercising daily, goes to [employer] Risk: very low risk, steady on feet Depression: no current sxs Hearing: normal to conversational tones and whispered voice at 6 ft ADL's: independent Cognitive: normal linear thought process, memory and attention intact Home Safety: Height, Weight, BMI, Visual Acuity: see vitals, vision corrected to 20/20 w/ glasses Counseling: UTD on mammo, due for DEXA.  No need for paps due to hysterectomy.  Pt received letter from GI that she is due for colonoscopy- considering not doing based on stressors w/ husband's illness. Labs Ordered: See A&P Care Plan: See A&P    Review of Systems Patient reports no vision/ hearing changes, adenopathy,fever, weight change,  persistant/recurrent hoarseness , swallowing issues, chest pain, palpitations, edema, persistant/recurrent cough, hemoptysis, dyspnea (rest/exertional/paroxysmal nocturnal), gastrointestinal bleeding (melena, rectal bleeding), abdominal pain, significant heartburn, bowel changes, GU symptoms (dysuria, hematuria, incontinence), Gyn symptoms (abnormal  bleeding, pain),  syncope, focal weakness, memory loss, numbness & tingling, skin/hair/nail changes, abnormal bruising or bleeding, anxiety, or depression.     Objective:   Physical Exam General Appearance:    Alert, cooperative, no distress, appears stated age  Head:    Normocephalic, without obvious abnormality, atraumatic  Eyes:    PERRL,  conjunctiva/corneas clear, EOM's intact, fundi    benign, both eyes  Ears:    Normal TM's and external ear canals, both ears  Nose:   Nares normal, septum midline, mucosa normal, no drainage    or sinus tenderness  Throat:   Lips, mucosa, and tongue normal; teeth and gums normal  Neck:   Supple, symmetrical, trachea midline, no adenopathy;    Thyroid: no enlargement/tenderness/nodules  Back:     Symmetric, no curvature, ROM normal, no CVA tenderness  Lungs:     Clear to auscultation bilaterally, respirations unlabored  Chest Wall:    No tenderness or deformity   Heart:    Regular rate and rhythm, S1 and S2 normal, no murmur, rub   or gallop  Breast Exam:    Deferred  Abdomen:     Soft, non-tender, bowel sounds active all four quadrants,    no masses, no organomegaly  Genitalia:    Deferred  Rectal:    Extremities:   Extremities normal, atraumatic, no cyanosis or edema  Pulses:   2+ and symmetric all extremities  Skin:   Skin color, texture, turgor normal, no rashes or lesions  Lymph nodes:   Cervical, supraclavicular, and axillary nodes normal  Neurologic:   CNII-XII intact, normal strength, sensation and reflexes    throughout          Assessment & Plan:

## 2011-08-17 LAB — VITAMIN D 1,25 DIHYDROXY
Vitamin D2 1, 25 (OH)2: <8 pg/mL
Vitamin D3 1, 25 (OH)2: 34 pg/mL

## 2011-08-17 NOTE — Assessment & Plan Note (Signed)
Pt's PE WNL.  UTD on mammo, due for DEXA.  No need for pap.  Pt thinking about deferring colonoscopy at this time.  Check labs.  Anticipatory guidance provided.

## 2011-08-17 NOTE — Assessment & Plan Note (Signed)
Chronic problem, pt again experiencing fatigue.  Would like CBC checked.  Check labs and determine next steps.

## 2011-08-17 NOTE — Assessment & Plan Note (Signed)
Chronic problem.  Due for DEXA- will get at time of next mammo.  Check Vit D- replete prn.

## 2011-08-17 NOTE — Assessment & Plan Note (Signed)
New.  Pt following w/ rheum.  Scoliosis is worsening which is causing increased sxs.  Will follow and assist as able.

## 2011-08-18 ENCOUNTER — Encounter: Payer: Self-pay | Admitting: *Deleted

## 2011-08-20 ENCOUNTER — Encounter: Payer: Self-pay | Admitting: Family Medicine

## 2011-08-29 NOTE — Telephone Encounter (Signed)
Message sent to pt via mychart

## 2011-09-17 ENCOUNTER — Encounter: Payer: Self-pay | Admitting: Family Medicine

## 2011-09-17 NOTE — Telephone Encounter (Signed)
Called pt to advise she will need an apt, pt accepted a 9:45am apt for 09-18-11 to discuss concern about discomfort under left breast per MD Beverely Low verbal offer to see prior to lab visit on 09-18-11 scheduled at 10am, MD Tabori made aware pt accepted apt, scheduler placed on schedule.

## 2011-09-18 ENCOUNTER — Ambulatory Visit (INDEPENDENT_AMBULATORY_CARE_PROVIDER_SITE_OTHER): Payer: Medicare Other | Admitting: Family Medicine

## 2011-09-18 ENCOUNTER — Other Ambulatory Visit: Payer: Self-pay | Admitting: Family Medicine

## 2011-09-18 ENCOUNTER — Other Ambulatory Visit: Payer: Medicare Other

## 2011-09-18 ENCOUNTER — Encounter: Payer: Self-pay | Admitting: Family Medicine

## 2011-09-18 ENCOUNTER — Ambulatory Visit (HOSPITAL_BASED_OUTPATIENT_CLINIC_OR_DEPARTMENT_OTHER)
Admission: RE | Admit: 2011-09-18 | Discharge: 2011-09-18 | Disposition: A | Discharge status: Home / Self Care | Payer: Medicare Other | Source: Ambulatory Visit | Attending: Family Medicine | Admitting: Family Medicine

## 2011-09-18 VITALS — BP 108/68 | HR 77 | Temp 98.3°F | Ht 63.25 in | Wt 109.2 lb

## 2011-09-18 DIAGNOSIS — D72819 Decreased white blood cell count, unspecified: Secondary | ICD-10-CM

## 2011-09-18 DIAGNOSIS — R071 Chest pain on breathing: Secondary | ICD-10-CM

## 2011-09-18 DIAGNOSIS — M3500 Sicca syndrome, unspecified: Secondary | ICD-10-CM | POA: Insufficient documentation

## 2011-09-18 DIAGNOSIS — N63 Unspecified lump in unspecified breast: Secondary | ICD-10-CM

## 2011-09-18 DIAGNOSIS — M329 Systemic lupus erythematosus, unspecified: Secondary | ICD-10-CM | POA: Insufficient documentation

## 2011-09-18 DIAGNOSIS — R0789 Other chest pain: Secondary | ICD-10-CM

## 2011-09-18 DIAGNOSIS — I7300 Raynaud's syndrome without gangrene: Secondary | ICD-10-CM | POA: Insufficient documentation

## 2011-09-18 LAB — CBC WITH DIFFERENTIAL/PLATELET
Basophils Absolute: 0 10*3/uL (ref 0.0–0.1)
Eosinophils Absolute: 0 10*3/uL (ref 0.0–0.7)
Hemoglobin: 11.1 g/dL — ABNORMAL LOW (ref 12.0–15.0)
Lymphocytes Relative: 28.7 % (ref 12.0–46.0)
MCHC: 33.6 g/dL (ref 30.0–36.0)
Monocytes Relative: 9.9 % (ref 3.0–12.0)
Neutro Abs: 2.2 10*3/uL (ref 1.4–7.7)
Neutrophils Relative %: 60.2 % (ref 43.0–77.0)
RDW: 13.5 % (ref 11.5–14.6)

## 2011-09-18 NOTE — Patient Instructions (Addendum)
Go to the [facility] to get your xray- we'll call you with your results Someone will call you with your mammogram Continue the Mobic Add Tylenol Heat or ice- whichever feels better Call with any questions or concerns Hang in there!!

## 2011-09-18 NOTE — Progress Notes (Signed)
  Subjective:    Patient ID: [name], female    DOB: 05/05/1930, 78 y.o.   MRN: [id number]  HPI Chest discomfort- sxs started 2 weeks ago, located just to L of sternum.  Area was tender to touch.  Then became painful to take a deep breath.  Has hx of 'small spot' on lungs as seen on PET.  Was seeing Dr Vassie Loll regularly but after 18 months stopped seeing him b/c things were stable.  Also had thoracic sympathectomy and wonders if her pain is due to scar tissue.  Still having discomfort to touch area.  No fevers.  No SOB.  Does exercises daily.  Admits to anxiety while caring giving for husband.  No cough.   Review of Systems For ROS see HPI     Objective:   Physical Exam  Vitals reviewed. Constitutional: She appears well-developed and well-nourished. No distress.  Cardiovascular: Normal rate, regular rhythm and normal heart sounds.   Pulmonary/Chest: Effort normal and breath sounds normal. No respiratory distress. She has no wheezes. She exhibits tenderness (L anterior chest wall at sternocostal jxn under medial breast border).            Assessment & Plan:

## 2011-09-19 ENCOUNTER — Encounter: Payer: Self-pay | Admitting: *Deleted

## 2011-09-24 ENCOUNTER — Other Ambulatory Visit: Payer: Medicare Other

## 2011-09-30 NOTE — Assessment & Plan Note (Signed)
New.  Likely musculoskeletal strain.  Reassured pt that this was not her lung.  Check CXR to r/o fx or lytic lesion.  Continue NSAIDs.  Reviewed supportive care and red flags that should prompt return.  Pt expressed understanding and is in agreement w/ plan.

## 2011-09-30 NOTE — Assessment & Plan Note (Signed)
New.  Get diagnostic mammo to assess.

## 2011-10-01 ENCOUNTER — Encounter: Payer: Self-pay | Admitting: Family Medicine

## 2011-10-01 ENCOUNTER — Other Ambulatory Visit (INDEPENDENT_AMBULATORY_CARE_PROVIDER_SITE_OTHER): Payer: Medicare Other

## 2011-10-01 DIAGNOSIS — R32 Unspecified urinary incontinence: Secondary | ICD-10-CM

## 2011-10-01 LAB — POCT URINALYSIS DIPSTICK
Bilirubin, UA: NEGATIVE
Blood, UA: NEGATIVE
Glucose, UA: NEGATIVE
Leukocytes, UA: NEGATIVE
Nitrite, UA: NEGATIVE
Urobilinogen, UA: 0.2

## 2011-10-01 NOTE — Telephone Encounter (Signed)
Please advise 

## 2011-10-01 NOTE — Telephone Encounter (Signed)
MD Beverely Low gave verbal to allow pt to leave the urine sample in lab today and after review she will send medication and advise pt of results and next steps, pt aware via my chart

## 2011-10-03 LAB — URINE CULTURE
Colony Count: NO GROWTH
Organism ID, Bacteria: NO GROWTH

## 2011-10-29 ENCOUNTER — Encounter: Payer: Self-pay | Admitting: Family Medicine

## 2011-10-29 MED ORDER — ESTRADIOL 0.1 MG/GM VA CREA: TOPICAL_CREAM | VAGINAL | Status: DC

## 2011-10-29 NOTE — Telephone Encounter (Signed)
Please advise 

## 2011-10-29 NOTE — Telephone Encounter (Signed)
Sent via escribe per verbal from MD Tabori, pt aware

## 2011-10-29 NOTE — Addendum Note (Signed)
Addended by: Derry Lory A on: 10/29/2011 12:11 PM   Modules accepted: Orders

## 2011-11-04 ENCOUNTER — Ambulatory Visit (HOSPITAL_COMMUNITY): Payer: Medicare Other

## 2011-11-05 ENCOUNTER — Ambulatory Visit (HOSPITAL_COMMUNITY)
Admission: RE | Admit: 2011-11-05 | Discharge: 2011-11-05 | Disposition: A | Discharge status: Home / Self Care | Payer: Medicare Other | Source: Ambulatory Visit | Attending: Family Medicine | Admitting: Family Medicine

## 2011-11-05 DIAGNOSIS — Z780 Asymptomatic menopausal state: Secondary | ICD-10-CM | POA: Insufficient documentation

## 2011-11-05 DIAGNOSIS — M899 Disorder of bone, unspecified: Secondary | ICD-10-CM

## 2011-11-05 DIAGNOSIS — Z13820 Encounter for screening for osteoporosis: Secondary | ICD-10-CM | POA: Insufficient documentation

## 2011-11-05 DIAGNOSIS — M949 Disorder of cartilage, unspecified: Secondary | ICD-10-CM

## 2011-11-06 ENCOUNTER — Encounter: Payer: Self-pay | Admitting: Gastroenterology

## 2011-11-13 ENCOUNTER — Telehealth: Payer: Self-pay | Admitting: *Deleted

## 2011-11-13 NOTE — Telephone Encounter (Signed)
Noted results for recent Dexa Scan, notation from MD Tabori as follows:  Pt has low bone mass, needs to start taking Ca1200 and Vit D 800, this can be accomplished by taking 2 OTC Caltrate daily Pt understood and requested a copy of the results to be mailed to her verified address

## 2011-11-19 ENCOUNTER — Encounter: Payer: Self-pay | Admitting: Family Medicine

## 2011-11-21 ENCOUNTER — Ambulatory Visit (INDEPENDENT_AMBULATORY_CARE_PROVIDER_SITE_OTHER): Payer: Medicare Other | Admitting: *Deleted

## 2011-11-21 DIAGNOSIS — Z23 Encounter for immunization: Secondary | ICD-10-CM

## 2012-01-18 ENCOUNTER — Encounter: Payer: Self-pay | Admitting: Family Medicine

## 2012-07-23 ENCOUNTER — Other Ambulatory Visit: Payer: Self-pay | Admitting: Family Medicine

## 2012-07-23 NOTE — Telephone Encounter (Signed)
Med filled.  

## 2012-09-22 ENCOUNTER — Other Ambulatory Visit: Payer: Self-pay

## 2013-02-09 ENCOUNTER — Encounter: Payer: Medicare Other | Admitting: Family Medicine

## 2013-02-22 ENCOUNTER — Ambulatory Visit (INDEPENDENT_AMBULATORY_CARE_PROVIDER_SITE_OTHER): Payer: Medicare HMO | Admitting: Family Medicine

## 2013-02-22 ENCOUNTER — Encounter: Payer: Self-pay | Admitting: Family Medicine

## 2013-02-22 VITALS — BP 132/72 | HR 74 | Temp 98.1°F | Wt 108.6 lb

## 2013-02-22 DIAGNOSIS — K219 Gastro-esophageal reflux disease without esophagitis: Secondary | ICD-10-CM

## 2013-02-22 MED ORDER — GI COCKTAIL ~~LOC~~
30.0000 mL | Freq: Once | ORAL | Status: AC
  Administered 2013-02-22: 30 mL via ORAL

## 2013-02-22 MED ORDER — OMEPRAZOLE 20 MG PO CPDR: 20.0000 mg | DELAYED_RELEASE_CAPSULE | Freq: Every day | ORAL | Status: DC

## 2013-02-22 NOTE — Progress Notes (Signed)
Pre visit review using our clinic review tool, if applicable. No additional management support is needed unless otherwise documented below in the visit note. 

## 2013-02-22 NOTE — Patient Instructions (Signed)

## 2013-02-22 NOTE — Progress Notes (Signed)
  Subjective:     [name] is an 80 y.o. female who presents for evaluation of heartburn. This has been associated with belching, chest pain, heartburn, midespigastric pain and regurgitation of undigested food. She denies abdominal bloating, choking on food, deep pressure at base of neck, difficulty swallowing, nausea and shortness of breath. Symptoms have been present for several days. She denies dysphagia. She has not lost weight. She denies melena, hematochezia, hematemesis, and coffee ground emesis. Medical therapy in the past has included: proton pump inhibitors.  The following portions of the patient's history were reviewed and updated as appropriate: allergies, current medications, past family history, past medical history, past social history, past surgical history and problem list.  Review of Systems Pertinent items are noted in HPI.   Objective:     BP 132/72  Pulse 74  Temp(Src) 98.1 F (36.7 C) (Oral)  Wt 108 lb 9.6 oz (49.261 kg)  SpO2 97% General appearance: alert, cooperative, appears stated age and no distress Throat: lips, mucosa, and tongue normal; teeth and gums normal Neck: no adenopathy, no carotid bruit, no JVD, supple, symmetrical, trachea midline and thyroid not enlarged, symmetric, no tenderness/mass/nodules Lungs: clear to auscultation bilaterally Abdomen: soft, non-tender; bowel sounds normal; no masses,  no organomegaly  Rectal-- heme neg brown stool Assessment:    Gastroesophageal Reflux Disease,      Plan:    Will start a trial of proton pump inhibitors. Follow up in a few days or sooner as needed.  Check labs Refer to GI

## 2013-02-23 ENCOUNTER — Encounter: Payer: Self-pay | Admitting: Physician Assistant

## 2013-02-23 LAB — BASIC METABOLIC PANEL
BUN: 13 mg/dL (ref 6–23)
CHLORIDE: 96 meq/L (ref 96–112)
CO2: 31 meq/L (ref 19–32)
CREATININE: 1 mg/dL (ref 0.4–1.2)
Calcium: 9.1 mg/dL (ref 8.4–10.5)
GFR: 60.12 mL/min (ref >60.00)
Glucose, Bld: 85 mg/dL (ref 70–99)
POTASSIUM: 4.5 meq/L (ref 3.5–5.1)
Sodium: 133 mEq/L — ABNORMAL LOW (ref 135–145)

## 2013-02-23 LAB — CBC WITH DIFFERENTIAL/PLATELET
BASOS PCT: 0.4 % (ref 0.0–3.0)
Basophils Absolute: 0 10*3/uL (ref 0.0–0.1)
EOS ABS: 0 10*3/uL (ref 0.0–0.7)
Eosinophils Relative: 0.8 % (ref 0.0–5.0)
HEMATOCRIT: 31.9 % — AB (ref 36.0–46.0)
HEMOGLOBIN: 10.9 g/dL — AB (ref 12.0–15.0)
LYMPHS ABS: 0.9 10*3/uL (ref 0.7–4.0)
Lymphocytes Relative: 33.2 % (ref 12.0–46.0)
MCHC: 34.2 g/dL (ref 30.0–36.0)
MCV: 87.7 fl (ref 78.0–100.0)
MONO ABS: 0.4 10*3/uL (ref 0.1–1.0)
Monocytes Relative: 14.9 % — ABNORMAL HIGH (ref 3.0–12.0)
NEUTROS ABS: 1.4 10*3/uL (ref 1.4–7.7)
NEUTROS PCT: 50.7 % (ref 43.0–77.0)
Platelets: 149 10*3/uL — ABNORMAL LOW (ref 150.0–400.0)
RBC: 3.63 Mil/uL — AB (ref 3.87–5.11)
RDW: 13.2 % (ref 11.5–14.6)
WBC: 2.7 10*3/uL — ABNORMAL LOW (ref 4.5–10.5)

## 2013-02-23 LAB — HEPATIC FUNCTION PANEL
ALBUMIN: 3.9 g/dL (ref 3.5–5.2)
ALT: 23 U/L (ref 0–35)
AST: 38 U/L — ABNORMAL HIGH (ref 0–37)
Alkaline Phosphatase: 36 U/L — ABNORMAL LOW (ref 39–117)
Bilirubin, Direct: 0.1 mg/dL (ref 0.0–0.3)
TOTAL PROTEIN: 6.8 g/dL (ref 6.0–8.3)
Total Bilirubin: 0.5 mg/dL (ref 0.3–1.2)

## 2013-02-23 LAB — H. PYLORI ANTIBODY, IGG: H PYLORI IGG: NEGATIVE

## 2013-02-28 ENCOUNTER — Ambulatory Visit: Payer: Medicare PPO | Admitting: Physician Assistant

## 2013-03-07 ENCOUNTER — Encounter: Payer: Self-pay | Admitting: Physician Assistant

## 2013-03-07 ENCOUNTER — Ambulatory Visit (INDEPENDENT_AMBULATORY_CARE_PROVIDER_SITE_OTHER): Payer: Medicare HMO | Admitting: Physician Assistant

## 2013-03-07 VITALS — BP 100/50 | HR 76 | Ht 63.0 in | Wt 108.4 lb

## 2013-03-07 DIAGNOSIS — K219 Gastro-esophageal reflux disease without esophagitis: Secondary | ICD-10-CM

## 2013-03-07 MED ORDER — OMEPRAZOLE 20 MG PO CPDR: 20.0000 mg | DELAYED_RELEASE_CAPSULE | Freq: Two times a day (BID) | ORAL | Status: DC

## 2013-03-07 NOTE — Progress Notes (Signed)
Subjective:    Patient ID: [name], female    DOB: [birth year], 80 y.o.   MRN: [id number]  HPI [name] is a pleasant 80 year old white female referred by Dr. Etter Sjogren for complaints of GERD. Patient has history of Sjgren syndrome and Raynaud's for which she had undergone a remote sympathectomy. She also has lupus with arthritic symptoms and history of iron deficiency anemia.. She reports previous colonoscopy being done about 9 years ago which was a negative exam. Prior to that she had had one colonoscopy with colon polyps. These were done elsewhere. She has not had any prior EGD. She reports a one-month history of heartburn and indigestion associated with increased belching and burping as well as sour brash. These symptoms had progressed somewhat. She has no complaints of abdominal pain. She had lost a couple of pounds but has regained them at this point. She does complain of some vague dysphagia no regurgitation. And no dye aphasia. She has been eating very small meals and was started on Prilosec 20 mg by mouth daily last week. She says she could definitely tell the difference in her symptoms within a couple of days-though the medicine seems to wear out later in the day. She is on Mobic and has been taking this chronically for years and does not feel that she can function without any anti-inflammatory.   Review of Systems  Constitutional: Positive for unexpected weight change.  HENT: Negative.   Eyes: Negative.   Respiratory: Negative.   Cardiovascular: Negative.   Gastrointestinal: Negative.   Endocrine: Negative.   Genitourinary: Negative.   Musculoskeletal: Positive for arthralgias and joint swelling.  Skin: Negative.   Neurological: Negative.   Hematological: Negative.   Psychiatric/Behavioral: Negative.    Outpatient Prescriptions Prior to Visit  Medication Sig Dispense Refill  . Biotin 1000 MCG tablet Take 1,000 mcg by mouth 2 (two) times daily.        . Calcium  Carbonate-Vitamin D (CALCIUM 500 + D) 500-125 MG-UNIT TABS Take by mouth daily.        . cholecalciferol (VITAMIN D) 1000 UNITS tablet Take 1,000 Units by mouth daily.        Marland Kitchen conjugated estrogens (PREMARIN) vaginal cream Place vaginally as directed. Twice weekly       . docusate sodium (STOOL SOFTENER) 100 MG capsule Take 100 mg by mouth 2 (two) times daily.        Marland Kitchen ESTRACE VAGINAL 0.1 MG/GM vaginal cream USE AS DIRECTED  42.5 g  0  . gabapentin (NEURONTIN) 600 MG tablet Take 600 mg by mouth 3 (three) times daily.      . Glucosamine-Chondroit-Vit C-Mn (GLUCOSAMINE CHONDR 1500 COMPLX PO) Take by mouth daily.        . hydroxychloroquine (PLAQUENIL) 200 MG tablet Take by mouth 2 (two) times daily.        . meloxicam (MOBIC) 15 MG tablet Take 15 mg by mouth daily.        . Multiple Vitamin (MULTIVITAMIN) tablet Take 1 tablet by mouth daily.        . vitamin C (ASCORBIC ACID) 500 MG tablet Take 500 mg by mouth daily.        Marland Kitchen omeprazole (PRILOSEC) 20 MG capsule Take 1 capsule (20 mg total) by mouth daily.  30 capsule  3   No facility-administered medications prior to visit.   Allergies  Allergen Reactions  . Aspirin     REACTION: nose bleeds  . Prochlorperazine Edisylate    Patient  Active Problem List   Diagnosis Date Noted  . Anterior chest wall pain 09/18/2011  . Breast lump 09/18/2011  . Sciatica of right side 08/13/2011  . Sinusitis 06/13/2011  . Cerumen impaction 08/20/2010  . SINUSITIS - ACUTE-NOS 04/30/2010  . OSTEOPENIA 03/07/2010  . PARESTHESIA 03/07/2010  . CONTUSION OF LOWER LEG 11/29/2009  . DIZZINESS 10/01/2009  . BACK PAIN, LEFT 03/16/2009  . Nonspecific (abnormal) findings on radiological and other examination of body structure 12/18/2008  . CT, CHEST, ABNORMAL 12/18/2008  . ABNORMAL ECHOCARDIOGRAM 12/14/2008  . URI 11/29/2008  . [place] SYNDROME 11/29/2008  . CONTACT DERMATITIS 11/02/2006  . HYPOGLYCEMIA 06/29/2006  . ANEMIA-IRON DEFICIENCY 06/29/2006  .  RAYNAUD'S DISEASE 06/29/2006   History  Substance Use Topics  . Smoking status: Never Smoker   . Smokeless tobacco: Never Used  . Alcohol Use: Yes     Comment: rare/social   family history includes Colon cancer in her father; Coronary artery disease in her father; Diabetes in her father. There is no history of Esophageal cancer, Kidney disease, or Liver disease.     Objective:   Physical Exam  well-developed elderly white female in no acute distress, pleasant blood pressure 100/50 pulse 76 height 5 foot 3 weight 108. HEENT; nontraumatic normocephalic EOMI PERRLA sclera anicteric, Supple; no JVD, Cardiovascular; regular rate and rhythm with S1-S2 no murmur or gallop, Pulmonary ;clear bilaterally, Abdomen ;soft flat nontender nondistended bowel sounds are active there is no palpable mass or hepatosplenomegaly, Rectal ;not done, Extremities; no clubbing cyanosis or edema she does have significant arthritic changes of the hands and also has some thickening of the skin on her hands. Psych ;mood and affect normal and appropriate        Assessment & Plan:  #96  80 year old female with recent onset of heartburn and indigestion improved on PPI therapy-suspect underlying GERD, patient may have had mild esophagitis which is now resolving. #2 autoimmune disease with history of lupus, rate not, and Sjogren's-certainly at increased risk for GI motility issues #3 history of iron deficiency anemia  Plan; increase Prilosec to 20 mg by mouth twice daily a.c. breakfast and dinner Do not feel that EGD is indicated at this time as not likely to change our therapy. Patient will follow an antireflux regimen and followup with Dr. Fuller Plan as needed and is encouraged to call back if symptoms recur.

## 2013-03-07 NOTE — Patient Instructions (Signed)
We sent a new prescription of Prilosec 20 mg, take 1 cap twice daily.  Sent 11 refills. Sent the prescription to [facility].  We have given you a  Reflux Disease Diet brochure.  Follow up with [place] as neede.

## 2013-03-07 NOTE — Progress Notes (Signed)
Reviewed and agree with management plan.  Jacon Whetzel T. Marcellus Pulliam, MD FACG 

## 2013-03-28 ENCOUNTER — Telehealth: Payer: Self-pay | Admitting: *Deleted

## 2013-03-28 NOTE — Telephone Encounter (Signed)
We have no copies of the image reports that she is talking about.  Who is ordering the imaging and who is following her for this issue?  [facility]- i would have no reservations about going there for treatment.  [facility] does not have thoracic surgeons but [facility] does have a good group.  Please call pt to get more info

## 2013-03-28 NOTE — Telephone Encounter (Signed)
Spoke with patient who had multiple concerns over the results from imaging studies of her chest. Recent scans have shown that the nodule on her right lung has grown in size and she is wanting to know if you feel she should go to [facility] to consult with the thoracic surgeon or do you recommend one within the [place] group. Please advise.

## 2013-03-29 NOTE — Telephone Encounter (Signed)
She said they told her they faxed it already. She is to contact them again to have then send again

## 2013-03-29 NOTE — Telephone Encounter (Signed)
We do not need to see the actual images but copies of the reports would be helpful to include in her chart for future reference on this issue.  Without her specialists faxing [place] her info, we have no idea how to comment or help her with situations just like this.  I would agree that if they see an area that is enlarging in size, a biopsy would be the next step.  I don't think she could go wrong w/ either group [facility] or [facility] group in [place]).  It depends on where she is most comfortable and which hospital would be more convenient for her- [facility] vs [facility].

## 2013-03-29 NOTE — Telephone Encounter (Signed)
Spoke with patient who advised that [facility] has ordered the CT and CXR within the last week. I advised the patient that we would like to s copies of the films for Dr. Birdie Riddle to review. Fraser Din states due to films showing the area in question increasing in size from previous films they are recommending a biopsy of the area. Advised pt of Dr. Virgil Benedict notes and she is to get copies of films and send to [place].

## 2013-04-01 ENCOUNTER — Other Ambulatory Visit: Payer: Self-pay

## 2013-04-01 DIAGNOSIS — D381 Neoplasm of uncertain behavior of trachea, bronchus and lung: Secondary | ICD-10-CM

## 2013-04-04 ENCOUNTER — Encounter: Payer: Self-pay | Admitting: Cardiothoracic Surgery

## 2013-04-04 ENCOUNTER — Institutional Professional Consult (permissible substitution) (INDEPENDENT_AMBULATORY_CARE_PROVIDER_SITE_OTHER): Payer: Medicare HMO | Admitting: Cardiothoracic Surgery

## 2013-04-04 ENCOUNTER — Ambulatory Visit (HOSPITAL_COMMUNITY)
Admission: RE | Admit: 2013-04-04 | Discharge: 2013-04-04 | Disposition: A | Discharge status: Home / Self Care | Payer: Medicare HMO | Source: Ambulatory Visit | Attending: Cardiothoracic Surgery | Admitting: Cardiothoracic Surgery

## 2013-04-04 ENCOUNTER — Other Ambulatory Visit: Payer: Self-pay | Admitting: *Deleted

## 2013-04-04 VITALS — BP 146/82 | HR 85 | Resp 20 | Ht 63.0 in | Wt 106.0 lb

## 2013-04-04 DIAGNOSIS — D381 Neoplasm of uncertain behavior of trachea, bronchus and lung: Secondary | ICD-10-CM

## 2013-04-04 DIAGNOSIS — R222 Localized swelling, mass and lump, trunk: Secondary | ICD-10-CM | POA: Insufficient documentation

## 2013-04-04 DIAGNOSIS — R918 Other nonspecific abnormal finding of lung field: Secondary | ICD-10-CM

## 2013-04-04 LAB — PULMONARY FUNCTION TEST
DL/VA % pred: 78 %
DL/VA: 3.51 ml/min/mmHg/L
DLCO cor % pred: 53 %
DLCO cor: 11.1 ml/min/mmHg
DLCO unc % pred: 53 %
DLCO unc: 11.1 ml/min/mmHg
FEF 25-75 Pre: 0.99 L/sec
FEF2575-%Pred-Pre: 79 %
FEV1-%Pred-Pre: 102 %
FEV1-Pre: 1.73 L
FEV1FVC-%Pred-Pre: 91 %
FEV6-%Pred-Pre: 117 %
FEV6-Pre: 2.55 L
FEV6FVC-%Pred-Pre: 105 %
FVC-%Pred-Pre: 112 %
FVC-Pre: 2.57 L
Pre FEV1/FVC ratio: 67 %
Pre FEV6/FVC Ratio: 99 %
RV % pred: 114 %
RV: 2.6 L
TLC % pred: 108 %
TLC: 5.09 L

## 2013-04-04 NOTE — Progress Notes (Signed)
[name] [address]       [place] [address]             [phone]                    [name] [name] Medical Record #[id number] Date of Birth: 11-25-1930  Referring: Raven Medicus, MD Primary Care: Raven Asa, MD  Chief Complaint:    Chief Complaint  Patient presents with  . Lung Mass    Surgical eval, Chest CT 03/25/13     History of Present Illness:    [name] 80 y.o. female is seen in the office  today for  evaluation of right upper lung mass. The patient was previously followed by [name] with   [name] for a right upper lobe lung opacity. The patient was last seen in 2011 at which time a CT scan of the chest had been done. PET scan in 2010 was not hypermetabolic. As the patient was being evaluated for her rheumatologic disease a chest x-ray was performed and repeat CT scan. The patient was referred to thoracic surgery for further evaluation of the right upper lung opacity. The patient has no previous history of tuberculosis or exposure to tuberculosis, she has chronically been on Plaquenil and Mobic but denies any long-term steroid or Enbral usage. She has a history of Raynaud phenomenon and underwent an open left thoracic sympathectomy by Dr. 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 She is a nonsmoker.    Current Activity/ Functional Status:  Patient is independent with mobility/ambulation, transfers, ADL's, IADL's.   Zubrod Score: At the time of surgery this patient's most appropriate activity status/level should be described as: []     0    Normal activity, no symptoms [x]     1    Restricted in physical strenuous activity but ambulatory, able to do out light work []     2    Ambulatory and capable of self care, unable to do work activities, up and about               >50 % of waking hours                              []     3    Only limited self care, in bed greater than 50% of waking hours []      4    Completely disabled, no self care, confined to bed or chair []     5    Moribund   Past Medical History  Diagnosis Date  . Iron deficiency anemia   . Sjogren's syndrome     [name]  . Abnormal ECG 11/2008  . Raynaud's phenomenon 1965  . Lupus     skin lupus  . Arthritis   . Colon polyps     Past Surgical History  Procedure Laterality Date  . Appendectomy    . Tonsillectomy    . Total abdominal hysterectomy  1980's   . Thoracic sympathetectomy  1965  . Cataract extraction Bilateral     Family History  Problem Relation Age of Onset  . Coronary artery disease Father   . Colon cancer Father   . Diabetes Father   . Esophageal cancer Neg Hx   . Kidney disease Neg Hx   . Liver disease Neg Hx     History  Social History  . Marital Status: Married    Spouse Name: N/A    Number of Children: N/A  . Years of Education: N/A   Occupational History  . Not on file.   Social History Main Topics  . Smoking status: Never Smoker   . Smokeless tobacco: Never Used  . Alcohol Use: Yes     Comment: rare/social  . Drug Use: No  . Sexual Activity: Not on file   Other Topics Concern  . Not on file   Social History Narrative   Lives with husband, daughter and grandchild local.    History  Smoking status  . Never Smoker   Smokeless tobacco  . Never Used    History  Alcohol Use  . Yes    Comment: rare/social     Allergies  Allergen Reactions  . Aspirin     REACTION: nose bleeds  . Prochlorperazine Edisylate     Current Outpatient Prescriptions  Medication Sig Dispense Refill  . Biotin 1000 MCG tablet Take 1,000 mcg by mouth 2 (two) times daily.        . Calcium Carbonate-Vitamin D (CALCIUM 500 + D) 500-125 MG-UNIT TABS Take by mouth daily.        . cholecalciferol (VITAMIN D) 1000 UNITS tablet Take 1,000 Units by mouth daily.        Marland Kitchen conjugated estrogens (PREMARIN) vaginal cream Place vaginally as directed. Twice weekly       . docusate sodium (STOOL  SOFTENER) 100 MG capsule Take 100 mg by mouth 2 (two) times daily.        Marland Kitchen ESTRACE VAGINAL 0.1 MG/GM vaginal cream USE AS DIRECTED  42.5 g  0  . gabapentin (NEURONTIN) 600 MG tablet Take 600 mg by mouth 3 (three) times daily.      . Glucosamine-Chondroit-Vit C-Mn (GLUCOSAMINE CHONDR 1500 COMPLX PO) Take by mouth daily.        . hydroxychloroquine (PLAQUENIL) 200 MG tablet Take by mouth 2 (two) times daily.        . meloxicam (MOBIC) 15 MG tablet Take 15 mg by mouth daily.        . Multiple Vitamin (MULTIVITAMIN) tablet Take 1 tablet by mouth daily.        Marland Kitchen omeprazole (PRILOSEC) 20 MG capsule Take 1 capsule (20 mg total) by mouth 2 (two) times daily before a meal.  60 capsule  11  . vitamin C (ASCORBIC ACID) 500 MG tablet Take 500 mg by mouth daily.         No current facility-administered medications for this visit.     Review of Systems:     Cardiac Review of Systems: Y or N  Chest Pain [    ]  Resting SOB [   ] Exertional SOB  [  ]  Orthopnea [  ]   Pedal Edema [   ]    Palpitations [  ] Syncope  [  ]   Presyncope [   ]  General Review of Systems: [Y] = yes [  ]=no Constitional: recent weight change [  y];  Wt loss over the last 3 months [   ] anorexia [  ]; fatigue [name]  ]; nausea [  ]; night sweats [  ]; fever [  ]; or chills [  ];          Dental: poor dentition[dentures  ]; Last Dentist visit:   Eye : blurred vision [  ]; diplopia [   ];  vision changes [  ];  Amaurosis fugax[  ]; Resp: cough [  ];  wheezing[  ];  hemoptysis[  ]; shortness of breath[  ]; paroxysmal nocturnal dyspnea[  ]; dyspnea on exertion[  ]; or orthopnea[  ];  GI:  gallstones[  ], vomiting[  ];  dysphagia[  ]; melena[  ];  hematochezia [  ]; heartburn[y  ];   Hx of  Colonoscopy[  ]; GU: kidney stones [  ]; hematuria[  ];   dysuria [  ];  nocturia[  ];  history of     obstruction [  ]; urinary frequency [  ]             Skin: rash, swelling[  ];, hair loss[ y ];  peripheral edema[ y ];  or itching[   ]; Musculosketetal: myalgias[y  ];  joint swelling[ y ];  joint erythema[  y];  joint pain[ y ];  back pain[ y ];  Heme/Lymph: bruising[  ];  bleeding[n  ];  anemia[  ];  Neuro: TIA[  ];  headaches[  ];  stroke[  ];  vertigo[  ];  seizures[ n ];   paresthesias[  ];  difficulty walking[  ];  Psych:depression[  ]; anxiety[ y ];  Endocrine: diabetes[n  ];  thyroid dysfunction[n  ];  Immunizations: Flu up to date [  ]; Pneumococcal up to date [  ];  Other:  Physical Exam: BP 146/82  Pulse 85  Resp 20  Ht 5\' 3"  (1.6 m)  Wt 106 lb (48.081 kg)  BMI 18.78 kg/m2  SpO2 98%  PHYSICAL EXAMINATION:  General appearance: alert, cooperative, appears stated age and cachectic Neurologic: intact Heart: regular rate and rhythm, S1, S2 normal, no murmur, click, rub or gallop Lungs: clear to auscultation bilaterally Abdomen: soft, non-tender; bowel sounds normal; no masses,  no organomegaly Extremities: Patient has evidence of scoliosis, has significant deformity of her joints of her fingers hands feet,  Well-healed left thoracotomy incision She has no cervical or supraclavicular adenopathy no carotid bruits,   Diagnostic Studies & Laboratory data:     Recent Radiology Findings:  RADIOLOGY REPORT*  Clinical Data: Chest wall pain. Rule out rib abnormality. Knot  midsternum with history of Sjogren's syndrome, lupus and Raynaud's  phenomenon  CHEST - 2 VIEW  Comparison: 12/12/2008  Findings: Pulmonary hyperinflation is noted and taking this into  consideration heart and mediastinal contours are stable. There is  an irregular density identified in the right upper lobe which  appears radiographically stable since 2010 making this most likely  to represent a chronic infectious or inflammatory process. A slow  growing malignant etiology is not excluded with certainty and  improved assessment for stability could be obtained with the repeat  chest CT.  No new focal infiltrates, signs of congestive  failure, pleural  fluid or significant peribronchial cuffing is identified.  Surgical clips are identified in the left paratracheal region and  bony structures appear intact. No definite rib abnormality is seen  IMPRESSION:  Unchanged irregular mass-like infiltrative process in the right  upper lobe appears radiographically stable suggesting chronic  etiology however a slow growing or indolent neoplastic process is  not excluded. Improved assessment for stability can be obtained  with repeat chest CT.  No new findings identified  Original Report Authenticated By: Ander Gaster  CT scan of chest:   CLINICAL DATA: Abnormal chest x-ray.  EXAM: CT CHEST WITH CONTRAST  TECHNIQUE: Multidetector CT imaging of the chest  was performed during intravenous contrast administration.  CONTRAST: 90 mL Omnipaque 300.  COMPARISON: DG THORACIC SPINE W/ SWIMMERS 3V dated 03/22/2013; CT CHEST W/O CM dated 03/26/2009; NM PET IMAGE INITIAL (PI) SKULL BASE TO THIGH dated 12/26/2008  FINDINGS: The right upper lobe mass has increased in size although the measurements are only slightly increased, there is more soft tissue density. This study is also better resolution than on prior. Craniocaudal, the mass measures 6.2 cm, previously 4.3 cm. There is increasing spiculation around the mass and a central low-attenuation regions suggesting necrosis. On axial imaging, the mass measures 2 cm x 3.5 cm. Cicatricial changes nearby appear more pronounced. Tortuous thoracic aorta is present. No acute vascular abnormality. No pericardial or pleural effusion. There is no axillary adenopathy. No mediastinal or hilar adenopathy. Incidental imaging of the upper abdomen is within normal limits. No adrenal masses are identified. Aortic atherosclerosis is present. Severe upper lumbar spondylosis. No compression fractures.  IMPRESSION: 1. Enlargement with increased soft tissue density of the right upper lobe mass  compared to prior examinations. 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 2. Thoracic surgery consultation is recommended for further management of the mass. Options include repeat PET-CT or tissue sampling. 3. These results were called by telephone at the time of interpretation on 03/25/2013 at 2:14 PM to Dr. Hermelinda Ellis , who verbally acknowledged these results.   Electronically Signed By: Dereck Ligas M.D. On: 03/25/2013 14:17   Recent Lab Findings: Lab Results  Component Value Date   WBC 2.7 Repeated and verified X2.* 02/22/2013   HGB 10.9* 02/22/2013   HCT 31.9* 02/22/2013   PLT 149.0 Repeated and verified X2.* 02/22/2013   GLUCOSE 85 02/22/2013   CHOL 208* 08/13/2011   TRIG 65.0 08/13/2011   HDL 82.10 08/13/2011   LDLDIRECT 100.9 08/13/2011   LDLCALC 102* 03/07/2010   ALT 23 02/22/2013   AST 38* 02/22/2013   NA 133* 02/22/2013   K 4.5 02/22/2013   CL 96 02/22/2013   CREATININE 1.0 02/22/2013   BUN 13 02/22/2013   CO2 31 02/22/2013   TSH 4.22 08/13/2011   INR 1.0 11/29/2009   PFTS: Patient had difficulty doing the PFTs, FEV1 was satisfactory she has significant diffusion capacity deficit but with some technical problems in obtaining the study   Assessment / Plan:    Chronically appearing opacity in the upper portion of the right lung since at least 2010, on measurements the mass is not appear significantly larger but appears more dense than first noted on CT scan 2011. I've reviewed the findings with the patient and and her daughter. At this point I recommended that we proceed with a PET scan and based on the findings here in proceed with either fine needle biopsy CT guided, or ENB (electromagnetic navigational bronchoscopy). Unfortunately the raw data for the chest CT scan done last week has already been erased. So if we go with ENB, she will need a repeat CT scan of  the chest with super D. Protocol.  I will plan to see the patient back next week after the PET scan is been performed.   . Iron deficiency anemia   . Sjogren's syndrome     [name]  . Abnormal ECG 11/2008  . Raynaud's phenomenon 1965  . Lupus     skin lupus  . Arthritis   . Colon polyps        I spent  55 minutes counseling the patient face to face. The total time spent in the appointment was 80 minutes.  [name] MD      [address] [place] [address] Office [phone]   Beeper [phone]  04/04/2013 3:11 PM

## 2013-04-06 ENCOUNTER — Encounter: Payer: Self-pay | Admitting: Family Medicine

## 2013-04-06 ENCOUNTER — Telehealth: Payer: Self-pay | Admitting: Pulmonary Disease

## 2013-04-06 NOTE — Telephone Encounter (Signed)
Message copied by Catha Gosselin on Wed Apr 06, 2013  2:30 PM ------      Message from: [place], [place]: Tue Apr 05, 2013 11:20 AM       My last note from feb 2011 (in centricity) says 'CT scan in 49mnths ' - seems like she was lost to FU for some reason            Can you investigate why?      I just want to be sure we have some way of tracking all our fu CT orders.            RA ------

## 2013-04-06 NOTE — Telephone Encounter (Signed)
According to referral note dated 08/03/09 in centricity: pt got a letter in the mail to call the [facility] office to set up her 31mon rov with ct scan@the  [facility] office will call me back if she needs to [facility]  August 03, 2009 3:14 PM  Are we to order this and have pt do this now? Please sent to [facility] if so. She will place order as [facility] is not able to order. thanks

## 2013-04-13 ENCOUNTER — Encounter: Payer: Medicare Other | Admitting: Family Medicine

## 2013-04-14 ENCOUNTER — Encounter (HOSPITAL_COMMUNITY): Payer: Medicare HMO

## 2013-04-14 ENCOUNTER — Ambulatory Visit: Payer: Medicare PPO | Admitting: Cardiothoracic Surgery

## 2013-04-19 ENCOUNTER — Telehealth: Payer: Self-pay

## 2013-04-19 NOTE — Telephone Encounter (Signed)
Please call pt and let her know that we are aware of her situation and that she is in our thoughts and prayers as she moves forward w/ her PET scan.

## 2013-04-19 NOTE — Telephone Encounter (Signed)
Patient presented to office for INR check for her spouse. Patient would like for Dr Birdie Riddle to be aware of what is currently happening with her. (unsure if they have sent records.) Her rheumatologist sent her for a scan which resulted in discovery of a mass in her lung. (This was seen as a small lesion on a previous PET Scan in 2010) It appears from pulmonary notes that she did not have her repeat f/u scan in 6 months as recommended. She is having another PET Scan and was to have a biopsy last week but snow forced her to reschedule. She is very concerned for her husband because if she needs surgery she has no one to care for him. States "we've got a lot going on right now". She wanted Dr Birdie Riddle to be aware of the situation.

## 2013-04-20 NOTE — Telephone Encounter (Signed)
Called pt and notified. Also advised that we are here if she needs anything.

## 2013-04-22 ENCOUNTER — Encounter (HOSPITAL_COMMUNITY)
Admission: RE | Admit: 2013-04-22 | Discharge: 2013-04-22 | Disposition: A | Discharge status: Home / Self Care | Payer: Medicare HMO | Source: Ambulatory Visit | Attending: Cardiothoracic Surgery | Admitting: Cardiothoracic Surgery

## 2013-04-22 ENCOUNTER — Encounter (HOSPITAL_COMMUNITY): Payer: Self-pay

## 2013-04-22 DIAGNOSIS — R222 Localized swelling, mass and lump, trunk: Secondary | ICD-10-CM | POA: Insufficient documentation

## 2013-04-22 DIAGNOSIS — R918 Other nonspecific abnormal finding of lung field: Secondary | ICD-10-CM

## 2013-04-22 LAB — GLUCOSE, CAPILLARY: Glucose-Capillary: 80 mg/dL (ref 70–99)

## 2013-04-22 MED ORDER — FLUDEOXYGLUCOSE F - 18 (FDG) INJECTION
7.1000 | Freq: Once | INTRAVENOUS | Status: AC | PRN
  Administered 2013-04-22: 7.1 via INTRAVENOUS

## 2013-04-25 ENCOUNTER — Ambulatory Visit (INDEPENDENT_AMBULATORY_CARE_PROVIDER_SITE_OTHER): Payer: Medicare HMO | Admitting: Cardiothoracic Surgery

## 2013-04-25 ENCOUNTER — Other Ambulatory Visit: Payer: Self-pay | Admitting: *Deleted

## 2013-04-25 ENCOUNTER — Encounter: Payer: Self-pay | Admitting: Cardiothoracic Surgery

## 2013-04-25 ENCOUNTER — Encounter (HOSPITAL_COMMUNITY): Payer: Self-pay | Admitting: Pharmacy Technician

## 2013-04-25 VITALS — BP 132/72 | HR 80 | Resp 20 | Ht 63.0 in | Wt 106.0 lb

## 2013-04-25 DIAGNOSIS — D381 Neoplasm of uncertain behavior of trachea, bronchus and lung: Secondary | ICD-10-CM

## 2013-04-25 DIAGNOSIS — R918 Other nonspecific abnormal finding of lung field: Secondary | ICD-10-CM

## 2013-04-25 NOTE — Progress Notes (Signed)
[address]       [place] [address]             [phone]                        [name] Medical Record #[id number] Date of Birth: [birth year]  Referring: Raven Medicus, MD Primary Care: Raven Asa, MD  Chief Complaint:    Chief Complaint  Patient presents with  . Lung Mass    F/U to discuss PET Scan 04/22/13    History of Present Illness:    [name] 80 y.o. female is seen in the office  today for  evaluation of right upper lung mass. The patient was previously followed by [name] with   [name] for a right upper lobe lung opacity. The patient was last seen in 2011 at which time a CT scan of the chest had been done. PET scan in 2010 was not hypermetabolic. As the patient was being evaluated for her rheumatologic disease a chest x-ray was performed and repeat CT scan. The patient was referred to thoracic surgery for further evaluation of the right upper lung opacity. The patient has no previous history of tuberculosis or exposure to tuberculosis, she has chronically been on Plaquenil and Mobic but denies any long-term steroid or Enbral usage. 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 She is a nonsmoker.  The patient returns to the office today after a followup PET scan was performed, there was some delay because of weather cancellations and the patient being able to get her scan done. She comes to the office today with her daughter  Current Activity/ Functional Status:  Patient is independent with mobility/ambulation, transfers, ADL's, IADL's.   Zubrod Score: At the time of surgery this patient's most appropriate activity status/level should be described as: []     0    Normal activity, no symptoms [x]     1    Restricted in physical strenuous activity but ambulatory, able to do out light  work []     2    Ambulatory and capable of self care, unable to do work activities, up and about               >50 % of waking hours                              []     3    Only limited self care, in bed greater than 50% of waking hours []     4    Completely disabled, no self care, confined to bed or chair []     5    Moribund   Past Medical History  Diagnosis Date  . Iron deficiency anemia   . Sjogren's syndrome     [name]  . Abnormal ECG 11/2008  . Raynaud's phenomenon 1965  . Lupus     skin lupus  . Arthritis   . Colon polyps     Past Surgical History  Procedure Laterality Date  . Appendectomy    . Tonsillectomy    . Total abdominal hysterectomy  1980's   . Thoracic sympathetectomy  1965  . Cataract extraction Bilateral     Family History  Problem  Relation Age of Onset  . Coronary artery disease Father   . Colon cancer Father   . Diabetes Father   . Esophageal cancer Neg Hx   . Kidney disease Neg Hx   . Liver disease Neg Hx     History   Social History  . Marital Status: Married    Spouse Name: N/A    Number of Children: N/A  . Years of Education: N/A   Occupational History  . Not on file.   Social History Main Topics  . Smoking status: Never Smoker   . Smokeless tobacco: Never Used  . Alcohol Use: Yes     Comment: rare/social  . Drug Use: No  . Sexual Activity: Not on file   Other Topics Concern  . Not on file   Social History Narrative   Lives with husband, daughter and grandchild local.    History  Smoking status  . Never Smoker   Smokeless tobacco  . Never Used    History  Alcohol Use  . Yes    Comment: rare/social     Allergies  Allergen Reactions  . Aspirin     REACTION: nose bleeds  . Prochlorperazine Edisylate     Current Outpatient Prescriptions  Medication Sig Dispense Refill  . Biotin 1000 MCG tablet Take 1,000 mcg by mouth 2 (two) times daily.        . Calcium Carbonate-Vitamin D (CALCIUM 500 + D) 500-125 MG-UNIT  TABS Take 1 tablet by mouth daily.       Marland Kitchen docusate sodium (STOOL SOFTENER) 100 MG capsule Take 100 mg by mouth 2 (two) times daily.        Marland Kitchen gabapentin (NEURONTIN) 600 MG tablet Take 600 mg by mouth 3 (three) times daily.      . Glucosamine-Chondroit-Vit C-Mn (GLUCOSAMINE CHONDR 1500 COMPLX PO) Take 1 capsule by mouth daily.       . hydroxychloroquine (PLAQUENIL) 200 MG tablet Take 200 mg by mouth 2 (two) times daily.       . meloxicam (MOBIC) 15 MG tablet Take 15 mg by mouth daily.        . Multiple Vitamin (MULTIVITAMIN) tablet Take 1 tablet by mouth daily.        Marland Kitchen omeprazole (PRILOSEC) 20 MG capsule Take 1 capsule (20 mg total) by mouth 2 (two) times daily before a meal.  60 capsule  11  . acetaminophen (TYLENOL) 500 MG tablet Take 500 mg by mouth every 6 (six) hours as needed for mild pain.      Marland Kitchen estradiol (ESTRACE) 0.1 MG/GM vaginal cream Place 1 Applicatorful vaginally 2 (two) times a week. On Wednesday and Sunday.      Vladimir Faster Glycol-Propyl Glycol (SYSTANE OP) Place 1 drop into both eyes 2 (two) times daily as needed (dry eyes).       No current facility-administered medications for this visit.     Review of Systems:     Cardiac Review of Systems: Y or N  Chest Pain [  n  ]  Resting SOB [ n  ] Exertional SOB  [ n ]  Orthopnea [  n]   Pedal Edema [  n ]    Palpitations [n  ] Syncope  [n  ]   Presyncope [  n ]  General Review of Systems: [Y] = yes [  ]=no Constitional: recent weight change [  y];  Wt loss over the last 3 months [   ] anorexia [  ];  fatigue [name]  ]; nausea [  ]; night sweats [  ]; fever [  ]; or chills [  ];          Dental: poor dentition[dentures  ]; Last Dentist visit:   Eye : blurred vision [  ]; diplopia [   ]; vision changes [  ];  Amaurosis fugax[  ]; Resp: cough [  ];  wheezing[  ];  hemoptysis[  ]; shortness of breath[  ]; paroxysmal nocturnal dyspnea[  ]; dyspnea on exertion[  ]; or orthopnea[  ];  GI:  gallstones[  ], vomiting[  ];  dysphagia[  ]; melena[   ];  hematochezia [  ]; heartburn[y  ];   Hx of  Colonoscopy[  ]; GU: kidney stones [  ]; hematuria[  ];   dysuria [  ];  nocturia[  ];  history of     obstruction [  ]; urinary frequency [  ]             Skin: rash, swelling[  ];, hair loss[ y ];  peripheral edema[ y ];  or itching[  ]; Musculosketetal: myalgias[y  ];  joint swelling[ y ];  joint erythema[  y];  joint pain[ y ];  back pain[ y ];  Heme/Lymph: bruising[  ];  bleeding[n  ];  anemia[  ];  Neuro: TIA[  ];  headaches[  ];  stroke[  ];  vertigo[  ];  seizures[ n ];   paresthesias[  ];  difficulty walking[  ];  Psych:depression[  ]; anxiety[ y ];  Endocrine: diabetes[n  ];  thyroid dysfunction[n  ];  Immunizations: Flu up to date [name]  ]; Pneumococcal up to date [name]  ];  Other:  Physical Exam: BP 132/72  Pulse 80  Resp 20  Ht 5\' 3"  (1.6 m)  Wt 106 lb (48.081 kg)  BMI 18.78 kg/m2  SpO2 %  PHYSICAL EXAMINATION:  General appearance: alert, cooperative, appears stated age and cachectic Neurologic: intact Heart: regular rate and rhythm, S1, S2 normal, no murmur, click, rub or gallop Lungs: clear to auscultation bilaterally Abdomen: soft, non-tender; bowel sounds normal; no masses,  no organomegaly Extremities: Patient has evidence of scoliosis, has significant deformity of her joints of her fingers hands feet,  Well-healed left thoracotomy incision She has no cervical or supraclavicular adenopathy no carotid bruits,   Diagnostic Studies & Laboratory data:     Recent Radiology Findings: Nm Pet Image Initial (pi) Skull Base To Thigh  04/22/2013   CLINICAL DATA:  Subsequent treatment strategy for lung mass .  EXAM: NUCLEAR MEDICINE PET SKULL BASE TO THIGH  FASTING BLOOD GLUCOSE:  Value: 80 mg/dl  TECHNIQUE: 7.1 mCi F-18 FDG was injected intravenously. Full-ring PET imaging was performed from the skull base to thigh after the radiotracer. CT data was obtained and used for attenuation correction and anatomic localization.   COMPARISON:  DG BONE DENSITY dated 11/05/2011; NM PET IMAGE INITIAL (PI) SKULL BASE TO THIGH dated 12/26/2008  FINDINGS: NECK  No hypermetabolic lymph nodes in the neck.  CHEST  The irregular consolidative right upper lobe mass is increased in metabolic activity compared to prior with SUV max 4.5 compared to SUV max 2.7 on prior. This masses is also increased in size measuring 4.2 x 2.0 cm compared to 3.4 by 1.7 cm on prior PET / CT 12/26/2008. No additional hypermetabolic pulmonary nodules. No hypermetabolic mediastinal lymph nodes.  ABDOMEN/PELVIS  No abnormal hypermetabolic activity within the liver, pancreas, adrenal glands,  or spleen. No hypermetabolic lymph nodes in the abdomen or pelvis.  SKELETON  Mild activity associated with the spinous process of the L5 vertebral body which is felt to be post traumatic / degenerative.  IMPRESSION: 1. Moderate metabolic activity associated with right upper lobe mass which is increased intensity and size gradually over time is concerning for an indolent neoplasm such as low-grade adenocarcinoma. Recommend percutaneous biopsy. 2. No evidence of metastasis. As of 03/28/2013 [facility] utilizes time-of-flight PET imaging. This new technology can increase SUV measurements by 16- 20 % over conventional PET technology.   Electronically Signed   By: Suzy Bouchard M.D.   On: 04/22/2013 15:16    RADIOLOGY REPORT*  Clinical Data: Chest wall pain. Rule out rib abnormality. Knot  midsternum with history of Sjogren's syndrome, lupus and Raynaud's  phenomenon  CHEST - 2 VIEW  Comparison: 12/12/2008  Findings: Pulmonary hyperinflation is noted and taking this into  consideration heart and mediastinal contours are stable. There is  an irregular density identified in the right upper lobe which  appears radiographically stable since 2010 making this most likely  to represent a chronic infectious or inflammatory process. A slow  growing malignant etiology is not  excluded with certainty and  improved assessment for stability could be obtained with the repeat  chest CT.  No new focal infiltrates, signs of congestive failure, pleural  fluid or significant peribronchial cuffing is identified.  Surgical clips are identified in the left paratracheal region and  bony structures appear intact. No definite rib abnormality is seen  IMPRESSION:  Unchanged irregular mass-like infiltrative process in the right  upper lobe appears radiographically stable suggesting chronic  etiology however a slow growing or indolent neoplastic process is  not excluded. Improved assessment for stability can be obtained  with repeat chest CT.  No new findings identified  Original Report Authenticated By: Ander Gaster  CT scan of chest:   CLINICAL DATA: Abnormal chest x-ray.  EXAM: CT CHEST WITH CONTRAST  TECHNIQUE: Multidetector CT imaging of the chest was performed during intravenous contrast administration.  CONTRAST: 90 mL Omnipaque 300.  COMPARISON: DG THORACIC SPINE W/ SWIMMERS 3V dated 03/22/2013; CT CHEST W/O CM dated 03/26/2009; NM PET IMAGE INITIAL (PI) SKULL BASE TO THIGH dated 12/26/2008  FINDINGS: The right upper lobe mass has increased in size although the measurements are only slightly increased, there is more soft tissue density. This study is also better resolution than on prior. Craniocaudal, the mass measures 6.2 cm, previously 4.3 cm. There is increasing spiculation around the mass and a central low-attenuation regions suggesting necrosis. On axial imaging, the mass measures 2 cm x 3.5 cm. Cicatricial changes nearby appear more pronounced. Tortuous thoracic aorta is present. No acute vascular abnormality. No pericardial or pleural effusion. There is no axillary adenopathy. No mediastinal or hilar adenopathy. Incidental imaging of the upper abdomen is within normal limits. No adrenal masses are identified. Aortic atherosclerosis is present.  Severe upper lumbar spondylosis. No compression fractures.  IMPRESSION: 1. Enlargement with increased soft tissue density of the right upper lobe mass compared to prior examinations. 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 2. Thoracic surgery consultation is recommended for further management of the mass. Options include repeat PET-CT or tissue sampling. 3. These results were called by telephone at the time of interpretation on 03/25/2013 at 2:14 PM to Dr. Hermelinda Ellis , who verbally acknowledged these  results.   Electronically Signed By: Dereck Ligas M.D. On: 03/25/2013 14:17   Recent Lab Findings: Lab Results  Component Value Date   WBC 2.7 Repeated and verified X2.* 02/22/2013   HGB 10.9* 02/22/2013   HCT 31.9* 02/22/2013   PLT 149.0 Repeated and verified X2.* 02/22/2013   GLUCOSE 85 02/22/2013   CHOL 208* 08/13/2011   TRIG 65.0 08/13/2011   HDL 82.10 08/13/2011   LDLDIRECT 100.9 08/13/2011   LDLCALC 102* 03/07/2010   ALT 23 02/22/2013   AST 38* 02/22/2013   NA 133* 02/22/2013   K 4.5 02/22/2013   CL 96 02/22/2013   CREATININE 1.0 02/22/2013   BUN 13 02/22/2013   CO2 31 02/22/2013   TSH 4.22 08/13/2011   INR 1.0 11/29/2009   PFTS: Patient had difficulty doing the PFTs, FEV1 was satisfactory she has significant diffusion capacity deficit but with some technical problems in obtaining the study   Assessment / Plan:    Chronically appearing opacity in the upper portion of the right lung since at least 2010, on measurements the mass appears to be enlarging . I've reviewed the findings of the current PET scan  with the patient and and her daughter. The mass may be indolent carcinoma. I have recommended a tissue DX   ENB (electromagnetic navigational bronchoscopy) with BX is recommended . Unfortunately the raw data for the chest CT scan done several weeks ago at  [facility]  has already been erased. So to proceed with ENB, she will have to have a SUPER D  repeat CT scan  Patient is on [place] which will be stopped now prior to bx and proceed Wednesday 3/18  . Iron deficiency anemia   . Sjogren's syndrome     [name]  . Abnormal ECG 11/2008  . Raynaud's phenomenon 1965  . Lupus     skin lupus  . Arthritis   . Colon polyps       [name] MD      [address] [place] [address] Office [phone]   [place]eeper [phone]

## 2013-04-26 ENCOUNTER — Other Ambulatory Visit: Payer: Self-pay

## 2013-04-26 DIAGNOSIS — D381 Neoplasm of uncertain behavior of trachea, bronchus and lung: Secondary | ICD-10-CM

## 2013-04-27 ENCOUNTER — Ambulatory Visit
Admission: RE | Admit: 2013-04-27 | Discharge: 2013-04-27 | Disposition: A | Discharge status: Home / Self Care | Payer: Medicare PPO | Source: Ambulatory Visit | Attending: Cardiothoracic Surgery | Admitting: Cardiothoracic Surgery

## 2013-04-27 DIAGNOSIS — D381 Neoplasm of uncertain behavior of trachea, bronchus and lung: Secondary | ICD-10-CM

## 2013-05-02 ENCOUNTER — Ambulatory Visit (HOSPITAL_COMMUNITY)
Admission: RE | Admit: 2013-05-02 | Discharge: 2013-05-02 | Disposition: A | Discharge status: Home / Self Care | Payer: Medicare PPO | Source: Ambulatory Visit | Attending: Cardiothoracic Surgery | Admitting: Cardiothoracic Surgery

## 2013-05-02 ENCOUNTER — Encounter (HOSPITAL_COMMUNITY): Payer: Self-pay

## 2013-05-02 ENCOUNTER — Encounter (HOSPITAL_COMMUNITY)
Admission: RE | Admit: 2013-05-02 | Discharge: 2013-05-02 | Disposition: A | Discharge status: Home / Self Care | Payer: Medicare PPO | Source: Ambulatory Visit | Attending: Cardiothoracic Surgery | Admitting: Cardiothoracic Surgery

## 2013-05-02 VITALS — BP 127/70 | HR 79 | Temp 98.0°F | Resp 20 | Ht 64.0 in | Wt 108.5 lb

## 2013-05-02 DIAGNOSIS — Z01818 Encounter for other preprocedural examination: Secondary | ICD-10-CM | POA: Insufficient documentation

## 2013-05-02 DIAGNOSIS — R222 Localized swelling, mass and lump, trunk: Secondary | ICD-10-CM | POA: Insufficient documentation

## 2013-05-02 DIAGNOSIS — Z01810 Encounter for preprocedural cardiovascular examination: Secondary | ICD-10-CM | POA: Insufficient documentation

## 2013-05-02 DIAGNOSIS — Z01812 Encounter for preprocedural laboratory examination: Secondary | ICD-10-CM | POA: Insufficient documentation

## 2013-05-02 DIAGNOSIS — R918 Other nonspecific abnormal finding of lung field: Secondary | ICD-10-CM

## 2013-05-02 HISTORY — DX: Other specified postprocedural states: Z98.890

## 2013-05-02 HISTORY — DX: Nausea with vomiting, unspecified: R11.2

## 2013-05-02 LAB — COMPREHENSIVE METABOLIC PANEL
ALT: 22 U/L (ref 0–35)
AST: 38 U/L — ABNORMAL HIGH (ref 0–37)
Albumin: 4 g/dL (ref 3.5–5.2)
Alkaline Phosphatase: 55 U/L (ref 39–117)
BUN: 23 mg/dL (ref 6–23)
CO2: 30 mEq/L (ref 19–32)
Calcium: 9.6 mg/dL (ref 8.4–10.5)
Chloride: 94 mEq/L — ABNORMAL LOW (ref 96–112)
Creatinine, Ser: 0.9 mg/dL (ref 0.50–1.10)
GFR calc Af Amer: 68 mL/min — ABNORMAL LOW (ref >90)
GFR calc non Af Amer: 59 mL/min — ABNORMAL LOW (ref >90)
Glucose, Bld: 95 mg/dL (ref 70–99)
Potassium: 4 mEq/L (ref 3.7–5.3)
Sodium: 136 mEq/L — ABNORMAL LOW (ref 137–147)
Total Bilirubin: <0.2 mg/dL — ABNORMAL LOW (ref 0.3–1.2)
Total Protein: 7.4 g/dL (ref 6.0–8.3)

## 2013-05-02 LAB — CBC
HCT: 35.4 % — ABNORMAL LOW (ref 36.0–46.0)
Hemoglobin: 11.7 g/dL — ABNORMAL LOW (ref 12.0–15.0)
MCH: 29.8 pg (ref 26.0–34.0)
MCHC: 33.1 g/dL (ref 30.0–36.0)
MCV: 90.1 fL (ref 78.0–100.0)
Platelets: 192 10*3/uL (ref 150–400)
RBC: 3.93 MIL/uL (ref 3.87–5.11)
RDW: 13.3 % (ref 11.5–15.5)
WBC: 4.9 10*3/uL (ref 4.0–10.5)

## 2013-05-02 LAB — APTT: aPTT: 27 seconds (ref 24–37)

## 2013-05-02 LAB — PROTIME-INR
INR: 0.91 (ref 0.00–1.49)
Prothrombin Time: 12.1 seconds (ref 11.6–15.2)

## 2013-05-02 NOTE — Progress Notes (Signed)
  Pt denies seeing a Cardiologist on a regular basis.  Pt's Rheumatologist is Dr. Gerilyn Nestle with [facility] whom referred pt to Dr. Servando Snare.

## 2013-05-02 NOTE — Pre-Procedure Instructions (Signed)
DAWNE CASALI  05/02/2013   Your procedure is scheduled on:  Wednesday, May 04, 2013  Report to [facility] at [employer] AM.  Call this number if you have problems the morning of surgery: [phone]   Remember:   Do not eat food or drink liquids after midnight.   Take these medicines the morning of surgery with A SIP OF WATER: acetaminophen (Tylenol), docusate sodium (stool softner), gabapentin (Neurontin), omeprazole (Prilosec), Systane (eye drops), hydroxychloroquine (Plaqenil)  STOP taking Aspirin, Goody's, BC's, Aleve (Naproxen), Ibuprofen (Advil or Motrin), Fish Oil, Vitamins, Herbal Supplements or any substance that could thin your blood starting today, Monday, 05/02/13.   Do not wear jewelry, make-up or nail polish.  Do not wear lotions, powders, or perfumes.   Do not shave 48 hours prior to surgery.   Do not bring valuables to the hospital.  [facility] is not responsible                  for any belongings or valuables.               Contacts, dentures or bridgework may not be worn into surgery.  Leave suitcase in the car. After surgery it may be brought to your room.  For patients admitted to the hospital, discharge time is determined by your                treatment team.               Patients discharged the day of surgery will not be allowed to drive  Home.   Special Instructions: Please use CHG soap the night before surgery and the day of surgery. CHG soap should be used atleast twice.   Please read over the following fact sheets that you were given: Pain Booklet, Coughing and Deep Breathing, MRSA Information and Surgical Site Infection Prevention

## 2013-05-03 ENCOUNTER — Encounter (HOSPITAL_COMMUNITY): Payer: Self-pay

## 2013-05-03 NOTE — Progress Notes (Addendum)
Anesthesia chart review:  Patient is a 80 year old female scheduled for video bronchoscopy with endobronchial navigation for evaluation of a RUL mass on 05/04/13 by Dr. Servando Snare.   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 PCP is Dr. [name]. Rheumatologist is Dr. Gerilyn Nestle with [facility].  EKG on 05/02/13 showed NSR, left BBB. She was diagnosed with a left BBB in 11/2008.  Dr. Birdie Riddle ordered an echo and referred her to cardiologist Dr. Haroldine Laws.  She subsequently had a nuclear stress test (signed on 12/25/08) that showed "EF 58% with fixed septal defect likely due to LBBB. No ischemia." PRN cardiology follow-up was recommended.  Echo on 12/11/08 showed: Normal LV size with mild global systolic dysfunction, EF 70-01%. Normal RV size and systolic function. Mild aortic insufficiency. Mild mitral insufficiency.  Preoperative CXR and labs noted.  If no acute changes then I anticipate that she can proceed as as planned.  George Hugh [facility]/Anesthesiology Phone [phone] 05/03/2013 9:57 AM

## 2013-05-04 ENCOUNTER — Encounter (HOSPITAL_COMMUNITY): Payer: Self-pay | Admitting: *Deleted

## 2013-05-04 ENCOUNTER — Encounter (HOSPITAL_COMMUNITY)
Admission: RE | Disposition: A | Discharge status: Home / Self Care | Payer: Self-pay | Source: Ambulatory Visit | Attending: Cardiothoracic Surgery

## 2013-05-04 ENCOUNTER — Ambulatory Visit (HOSPITAL_COMMUNITY)
Admission: RE | Admit: 2013-05-04 | Discharge: 2013-05-04 | Disposition: A | Discharge status: Home / Self Care | Payer: Medicare HMO | Source: Ambulatory Visit | Attending: Cardiothoracic Surgery | Admitting: Cardiothoracic Surgery

## 2013-05-04 ENCOUNTER — Ambulatory Visit (HOSPITAL_COMMUNITY): Payer: Medicare HMO

## 2013-05-04 ENCOUNTER — Ambulatory Visit (HOSPITAL_COMMUNITY): Payer: Medicare HMO | Admitting: Certified Registered Nurse Anesthetist

## 2013-05-04 ENCOUNTER — Encounter (HOSPITAL_COMMUNITY): Payer: Medicare HMO | Admitting: Vascular Surgery

## 2013-05-04 DIAGNOSIS — M329 Systemic lupus erythematosus, unspecified: Secondary | ICD-10-CM | POA: Insufficient documentation

## 2013-05-04 DIAGNOSIS — Z860100 Personal history of colon polyps, unspecified: Secondary | ICD-10-CM | POA: Insufficient documentation

## 2013-05-04 DIAGNOSIS — C3410 Malignant neoplasm of upper lobe, unspecified bronchus or lung: Secondary | ICD-10-CM | POA: Insufficient documentation

## 2013-05-04 DIAGNOSIS — I7300 Raynaud's syndrome without gangrene: Secondary | ICD-10-CM | POA: Insufficient documentation

## 2013-05-04 DIAGNOSIS — R222 Localized swelling, mass and lump, trunk: Secondary | ICD-10-CM

## 2013-05-04 DIAGNOSIS — M3500 Sicca syndrome, unspecified: Secondary | ICD-10-CM | POA: Insufficient documentation

## 2013-05-04 DIAGNOSIS — D509 Iron deficiency anemia, unspecified: Secondary | ICD-10-CM | POA: Insufficient documentation

## 2013-05-04 DIAGNOSIS — G909 Disorder of the autonomic nervous system, unspecified: Secondary | ICD-10-CM | POA: Insufficient documentation

## 2013-05-04 DIAGNOSIS — M129 Arthropathy, unspecified: Secondary | ICD-10-CM | POA: Insufficient documentation

## 2013-05-04 DIAGNOSIS — I739 Peripheral vascular disease, unspecified: Secondary | ICD-10-CM | POA: Insufficient documentation

## 2013-05-04 HISTORY — PX: VIDEO BRONCHOSCOPY WITH ENDOBRONCHIAL NAVIGATION: SHX6175

## 2013-05-04 SURGERY — VIDEO BRONCHOSCOPY WITH ENDOBRONCHIAL NAVIGATION
Anesthesia: General

## 2013-05-04 MED ORDER — ONDANSETRON HCL 4 MG/2ML IJ SOLN
INTRAMUSCULAR | Status: DC | PRN
  Administered 2013-05-04: 4 mg via INTRAVENOUS

## 2013-05-04 MED ORDER — DEXAMETHASONE SODIUM PHOSPHATE 4 MG/ML IJ SOLN
INTRAMUSCULAR | Status: DC | PRN
  Administered 2013-05-04: 8 mg via INTRAVENOUS

## 2013-05-04 MED ORDER — PROPOFOL 10 MG/ML IV BOLUS
INTRAVENOUS | Status: DC | PRN
  Administered 2013-05-04: 80 mg via INTRAVENOUS

## 2013-05-04 MED ORDER — EPHEDRINE SULFATE 50 MG/ML IJ SOLN
INTRAMUSCULAR | Status: AC
  Filled 2013-05-04: qty 1

## 2013-05-04 MED ORDER — SUCCINYLCHOLINE CHLORIDE 20 MG/ML IJ SOLN
INTRAMUSCULAR | Status: AC
  Filled 2013-05-04: qty 1

## 2013-05-04 MED ORDER — PHENYLEPHRINE HCL 10 MG/ML IJ SOLN
10.0000 mg | INTRAVENOUS | Status: DC | PRN
  Administered 2013-05-04: 40 ug/min via INTRAVENOUS

## 2013-05-04 MED ORDER — GLYCOPYRROLATE 0.2 MG/ML IJ SOLN
INTRAMUSCULAR | Status: DC | PRN
  Administered 2013-05-04: 0.4 mg via INTRAVENOUS

## 2013-05-04 MED ORDER — ROCURONIUM BROMIDE 100 MG/10ML IV SOLN
INTRAVENOUS | Status: DC | PRN
  Administered 2013-05-04: 10 mg via INTRAVENOUS
  Administered 2013-05-04: 30 mg via INTRAVENOUS

## 2013-05-04 MED ORDER — LACTATED RINGERS IV SOLN
INTRAVENOUS | Status: DC | PRN
  Administered 2013-05-04: 08:00:00 via INTRAVENOUS

## 2013-05-04 MED ORDER — NEOSTIGMINE METHYLSULFATE 1 MG/ML IJ SOLN
INTRAMUSCULAR | Status: AC
  Filled 2013-05-04: qty 40

## 2013-05-04 MED ORDER — EPINEPHRINE HCL 1 MG/ML IJ SOLN
INTRAMUSCULAR | Status: AC
  Filled 2013-05-04: qty 1

## 2013-05-04 MED ORDER — ONDANSETRON HCL 4 MG/2ML IJ SOLN
INTRAMUSCULAR | Status: AC
  Filled 2013-05-04: qty 2

## 2013-05-04 MED ORDER — 0.9 % SODIUM CHLORIDE (POUR BTL) OPTIME
TOPICAL | Status: DC | PRN
  Administered 2013-05-04: 1000 mL

## 2013-05-04 MED ORDER — ONDANSETRON HCL 4 MG/2ML IJ SOLN: 4.0000 mg | Freq: Once | INTRAMUSCULAR | Status: DC | PRN

## 2013-05-04 MED ORDER — LIDOCAINE HCL (CARDIAC) 20 MG/ML IV SOLN
INTRAVENOUS | Status: AC
  Filled 2013-05-04: qty 5

## 2013-05-04 MED ORDER — ROCURONIUM BROMIDE 50 MG/5ML IV SOLN
INTRAVENOUS | Status: AC
  Filled 2013-05-04: qty 1

## 2013-05-04 MED ORDER — FENTANYL CITRATE 0.05 MG/ML IJ SOLN
INTRAMUSCULAR | Status: DC | PRN
  Administered 2013-05-04: 100 ug via INTRAVENOUS

## 2013-05-04 MED ORDER — SODIUM CHLORIDE 0.9 % IJ SOLN
INTRAMUSCULAR | Status: AC
Start: 2013-05-04 — End: 2013-05-04
  Filled 2013-05-04: qty 10

## 2013-05-04 MED ORDER — PHENYLEPHRINE 40 MCG/ML (10ML) SYRINGE FOR IV PUSH (FOR BLOOD PRESSURE SUPPORT)
PREFILLED_SYRINGE | INTRAVENOUS | Status: AC
  Filled 2013-05-04: qty 10

## 2013-05-04 MED ORDER — FENTANYL CITRATE 0.05 MG/ML IJ SOLN
INTRAMUSCULAR | Status: AC
  Filled 2013-05-04: qty 5

## 2013-05-04 MED ORDER — NEOSTIGMINE METHYLSULFATE 1 MG/ML IJ SOLN
INTRAMUSCULAR | Status: DC | PRN
  Administered 2013-05-04: 3 mg via INTRAVENOUS

## 2013-05-04 MED ORDER — PROPOFOL 10 MG/ML IV BOLUS
INTRAVENOUS | Status: AC
  Filled 2013-05-04: qty 20

## 2013-05-04 MED ORDER — LIDOCAINE HCL (CARDIAC) 20 MG/ML IV SOLN
INTRAVENOUS | Status: DC | PRN
  Administered 2013-05-04: 40 mg via INTRAVENOUS

## 2013-05-04 MED ORDER — HYDROMORPHONE HCL PF 1 MG/ML IJ SOLN: 0.2500 mg | INTRAMUSCULAR | Status: DC | PRN

## 2013-05-04 SURGICAL SUPPLY — 31 items
BRUSH SUPERTRAX BIOPSY (INSTRUMENTS) ×1 IMPLANT
BRUSH SUPERTRAX NDL-TIP CYTO (INSTRUMENTS) ×1 IMPLANT
CANISTER SUCTION 2500CC (MISCELLANEOUS) ×2 IMPLANT
CHANNEL WORK EXTEND EDGE 180 (KITS) IMPLANT
CHANNEL WORK EXTEND EDGE 45 (KITS) IMPLANT
CHANNEL WORK EXTEND EDGE 90 (KITS) IMPLANT
CONT SPEC 4OZ CLIKSEAL STRL BL (MISCELLANEOUS) ×4 IMPLANT
COVER TABLE BACK 60X90 (DRAPES) ×2 IMPLANT
DRSG AQUACEL AG ADV 3.5X14 (GAUZE/BANDAGES/DRESSINGS) ×2 IMPLANT
FILTER STRAW FLUID ASPIR (MISCELLANEOUS) IMPLANT
FORCEPS BIOP SUPERTRX PREMAR (INSTRUMENTS) IMPLANT
GLOVE BIO SURGEON STRL SZ 6.5 (GLOVE) ×2 IMPLANT
GLOVE BIOGEL PI IND STRL 6.5 (GLOVE) IMPLANT
GLOVE BIOGEL PI INDICATOR 6.5 (GLOVE) ×1
KIT PROCEDURE EDGE 180 (KITS) IMPLANT
KIT PROCEDURE EDGE 45 (KITS) IMPLANT
KIT PROCEDURE EDGE 90 (KITS) ×1 IMPLANT
KIT ROOM TURNOVER OR (KITS) ×2 IMPLANT
MARKER SKIN DUAL TIP RULER LAB (MISCELLANEOUS) ×2 IMPLANT
NDL SUPERTRX PREMARK BIOPSY (NEEDLE) IMPLANT
NEEDLE SUPERTRX PREMARK BIOPSY (NEEDLE) IMPLANT
NS IRRIG 1000ML POUR BTL (IV SOLUTION) ×2 IMPLANT
OIL SILICONE PENTAX (PARTS (SERVICE/REPAIRS)) ×2 IMPLANT
PAD ARMBOARD 7.5X6 YLW CONV (MISCELLANEOUS) ×4 IMPLANT
PATCHES PATIENT (LABEL) ×6 IMPLANT
SPONGE GAUZE 4X4 12PLY (GAUZE/BANDAGES/DRESSINGS) ×2 IMPLANT
SYR 20CC LL (SYRINGE) IMPLANT
SYR 20ML ECCENTRIC (SYRINGE) ×2 IMPLANT
TOWEL OR 17X24 6PK STRL BLUE (TOWEL DISPOSABLE) ×2 IMPLANT
TRAP SPECIMEN MUCOUS 40CC (MISCELLANEOUS) ×2 IMPLANT
TUBE CONNECTING 12X1/4 (SUCTIONS) ×2 IMPLANT

## 2013-05-04 NOTE — H&P (Signed)
[name]       [name]             [phone]                       [name] Medical Record #[id number] Date of Birth: Jan 08, 1931  Referring: Raven Medicus, MD Primary Care: Raven Asa, MD  Chief Complaint:    Lung MAss  History of Present Illness:    [name] 80 y.o. female is seen in the office for  evaluation of right upper lung mass. The patient was previously followed by [name] with   [name]  [facility] for a right upper lobe lung opacity. The patient was last seen in 2011 at which time a CT scan of the chest had been done. PET scan in 2010 was not hypermetabolic. As the patient was being evaluated for her rheumatologic disease a chest x-ray was performed and repeat CT scan. The patient was referred to thoracic surgery for further evaluation of the right upper lung opacity. The patient has no previous history of tuberculosis or exposure to tuberculosis, she has chronically been on Plaquenil and Mobic but denies any long-term steroid or Enbral usage. She has a history of Raynaud phenomenon and underwent an open left thoracic sympathectomy by Dr. 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 She is a nonsmoker.  The patient returns to the officeafter a followup PET scan was performed, there was some delay because of weather cancellations and the patient being able to get her scan done. She comes to the office today with her daughter  Current Activity/ Functional Status:  Patient is independent with mobility/ambulation, transfers, ADL's, IADL's.   Zubrod Score: At the time of surgery this patient's most appropriate activity status/level should be described as: []     0    Normal activity, no symptoms [x]     1    Restricted in physical strenuous activity but ambulatory, able to do out light work []     2    Ambulatory and capable of self care, unable to do work activities, up and about                >50 % of waking hours                              []     3    Only limited self care, in bed greater than 50% of waking hours []     4    Completely disabled, no self care, confined to bed or chair []     5    Moribund   Past Medical History  Diagnosis Date  . Iron deficiency anemia   . Sjogren's syndrome     [name]  . Abnormal ECG 11/2008    left bbb  . Raynaud's phenomenon 1965  . Lupus     skin lupus  . Arthritis   . Colon polyps   . PONV (postoperative nausea and vomiting)     Past Surgical History  Procedure Laterality Date  . Appendectomy    . Tonsillectomy    . Total abdominal hysterectomy  1980's   . Thoracic sympathetectomy  1965  . Cataract extraction Bilateral   . Colonoscopy    . Cardiovascular stress test      12/2008 mild fixed basal to mid septal  perfusion defect felt likely due to artifact from LBBB, no ischemia, EF 58%    Family History  Problem Relation Age of Onset  . Coronary artery disease Father   . Colon cancer Father   . Diabetes Father   . Esophageal cancer Neg Hx   . Kidney disease Neg Hx   . Liver disease Neg Hx     History   Social History  . Marital Status: Married    Spouse Name: N/A    Number of Children: N/A  . Years of Education: N/A   Occupational History  . Not on file.   Social History Main Topics  . Smoking status: Never Smoker   . Smokeless tobacco: Never Used  . Alcohol Use: Yes     Comment: rare/social  . Drug Use: No  . Sexual Activity: Not on file   Other Topics Concern  . Not on file   Social History Narrative   Lives with husband, daughter and grandchild local.    History  Smoking status  . Never Smoker   Smokeless tobacco  . Never Used    History  Alcohol Use  . Yes    Comment: rare/social     Allergies  Allergen Reactions  . Prochlorperazine Edisylate Anaphylaxis    Compazine  . Aspirin     REACTION: nose bleeds    No current facility-administered medications for this  encounter.     Review of Systems:     Cardiac Review of Systems: Y or N  Chest Pain [  n  ]  Resting SOB [ n  ] Exertional SOB  [ n ]  Orthopnea [  n]   Pedal Edema [  n ]    Palpitations [n  ] Syncope  [n  ]   Presyncope [  n ]  General Review of Systems: [Y] = yes [  ]=no Constitional: recent weight change [  y];  Wt loss over the last 3 months [   ] anorexia [  ]; fatigue [name]  ]; nausea [  ]; night sweats [  ]; fever [  ]; or chills [  ];          Dental: poor dentition[dentures  ]; Last Dentist visit:   Eye : blurred vision [  ]; diplopia [   ]; vision changes [  ];  Amaurosis fugax[  ]; Resp: cough [  ];  wheezing[  ];  hemoptysis[  ]; shortness of breath[  ]; paroxysmal nocturnal dyspnea[  ]; dyspnea on exertion[  ]; or orthopnea[  ];  GI:  gallstones[  ], vomiting[  ];  dysphagia[  ]; melena[  ];  hematochezia [  ]; heartburn[y  ];   Hx of  Colonoscopy[  ]; GU: kidney stones [  ]; hematuria[  ];   dysuria [  ];  nocturia[  ];  history of     obstruction [  ]; urinary frequency [  ]             Skin: rash, swelling[  ];, hair loss[ y ];  peripheral edema[ y ];  or itching[  ]; Musculosketetal: myalgias[y  ];  joint swelling[ y ];  joint erythema[  y];  joint pain[ y ];  back pain[ y ];  Heme/Lymph: bruising[  ];  bleeding[n  ];  anemia[  ];  Neuro: TIA[  ];  headaches[  ];  stroke[  ];  vertigo[  ];  seizures[ n ];  paresthesias[  ];  difficulty walking[  ];  Psych:depression[  ]; anxiety[ y ];  Endocrine: diabetes[n  ];  thyroid dysfunction[n  ];  Immunizations: Flu up to date [name]  ]; Pneumococcal up to date [name]  ];  Other:  Physical Exam: BP 130/64  Pulse 73  Temp(Src) 98.3 F (36.8 C) (Oral)  Resp 20  SpO2 100%  PHYSICAL EXAMINATION:  General appearance: alert, cooperative, appears stated age and cachectic Neurologic: intact Heart: regular rate and rhythm, S1, S2 normal, no murmur, click, rub or gallop Lungs: clear to auscultation bilaterally Abdomen: soft, non-tender;  bowel sounds normal; no masses,  no organomegaly Extremities: Patient has evidence of scoliosis, has significant deformity of her joints of her fingers hands feet,  Well-healed left thoracotomy incision She has no cervical or supraclavicular adenopathy no carotid bruits,   Diagnostic Studies & Laboratory data:     Recent Radiology Findings: Dg Chest 2 View Within Previous 72 Hours.  Films Obtained On Friday Are Acceptable For Monday And Tuesday Cases  05/02/2013   CLINICAL DATA:  Preop lung mass.  Pre bronchoscopy.  EXAM: CHEST  2 VIEW  COMPARISON:  CT SUPER D CHEST WO CM dated 04/27/2013  FINDINGS: Irregular masslike density noted in the right upper lobe as seen on prior chest x-ray concerning for primary lung cancer. Left lung is clear. Hyperinflation. Heart is normal size. No effusions. No acute bony abnormality.  IMPRESSION: Right upper lobe irregular masslike density.  Hyperinflation/ COPD.   Electronically Signed   By: Rolm Baptise M.D.   On: 05/02/2013 14:38   Nm Pet Image Initial (pi) Skull Base To Thigh  04/22/2013   CLINICAL DATA:  Subsequent treatment strategy for lung mass .  EXAM: NUCLEAR MEDICINE PET SKULL BASE TO THIGH  FASTING BLOOD GLUCOSE:  Value: 80 mg/dl  TECHNIQUE: 7.1 mCi F-18 FDG was injected intravenously. Full-ring PET imaging was performed from the skull base to thigh after the radiotracer. CT data was obtained and used for attenuation correction and anatomic localization.  COMPARISON:  DG BONE DENSITY dated 11/05/2011; NM PET IMAGE INITIAL (PI) SKULL BASE TO THIGH dated 12/26/2008  FINDINGS: NECK  No hypermetabolic lymph nodes in the neck.  CHEST  The irregular consolidative right upper lobe mass is increased in metabolic activity compared to prior with SUV max 4.5 compared to SUV max 2.7 on prior. This masses is also increased in size measuring 4.2 x 2.0 cm compared to 3.4 by 1.7 cm on prior PET / CT 12/26/2008. No additional hypermetabolic pulmonary nodules. No hypermetabolic  mediastinal lymph nodes.  ABDOMEN/PELVIS  No abnormal hypermetabolic activity within the liver, pancreas, adrenal glands, or spleen. No hypermetabolic lymph nodes in the abdomen or pelvis.  SKELETON  Mild activity associated with the spinous process of the L5 vertebral body which is felt to be post traumatic / degenerative.  IMPRESSION: 1. Moderate metabolic activity associated with right upper lobe mass which is increased intensity and size gradually over time is concerning for an indolent neoplasm such as low-grade adenocarcinoma. Recommend percutaneous biopsy. 2. No evidence of metastasis. As of 03/28/2013 [facility] utilizes time-of-flight PET imaging. This new technology can increase SUV measurements by 16- 20 % over conventional PET technology.   Electronically Signed   By: Suzy Bouchard M.D.   On: 04/22/2013 15:16    RADIOLOGY REPORT*  Clinical Data: Chest wall pain. Rule out rib abnormality. Knot  midsternum with history of Sjogren's syndrome, lupus and Raynaud's  phenomenon  CHEST -  2 VIEW  Comparison: 12/12/2008  Findings: Pulmonary hyperinflation is noted and taking this into  consideration heart and mediastinal contours are stable. There is  an irregular density identified in the right upper lobe which  appears radiographically stable since 2010 making this most likely  to represent a chronic infectious or inflammatory process. A slow  growing malignant etiology is not excluded with certainty and  improved assessment for stability could be obtained with the repeat  chest CT.  No new focal infiltrates, signs of congestive failure, pleural  fluid or significant peribronchial cuffing is identified.  Surgical clips are identified in the left paratracheal region and  bony structures appear intact. No definite rib abnormality is seen  IMPRESSION:  Unchanged irregular mass-like infiltrative process in the right  upper lobe appears radiographically stable suggesting chronic    etiology however a slow growing or indolent neoplastic process is  not excluded. Improved assessment for stability can be obtained  with repeat chest CT.  No new findings identified  Original Report Authenticated By: Ander Gaster  CT scan of chest:   CLINICAL DATA: Abnormal chest x-ray.  EXAM: CT CHEST WITH CONTRAST  TECHNIQUE: Multidetector CT imaging of the chest was performed during intravenous contrast administration.  CONTRAST: 90 mL Omnipaque 300.  COMPARISON: DG THORACIC SPINE W/ SWIMMERS 3V dated 03/22/2013; CT CHEST W/O CM dated 03/26/2009; NM PET IMAGE INITIAL (PI) SKULL BASE TO THIGH dated 12/26/2008  FINDINGS: The right upper lobe mass has increased in size although the measurements are only slightly increased, there is more soft tissue density. This study is also better resolution than on prior. Craniocaudal, the mass measures 6.2 cm, previously 4.3 cm. There is increasing spiculation around the mass and a central low-attenuation regions suggesting necrosis. On axial imaging, the mass measures 2 cm x 3.5 cm. Cicatricial changes nearby appear more pronounced. Tortuous thoracic aorta is present. No acute vascular abnormality. No pericardial or pleural effusion. There is no axillary adenopathy. No mediastinal or hilar adenopathy. Incidental imaging of the upper abdomen is within normal limits. No adrenal masses are identified. Aortic atherosclerosis is present. Severe upper lumbar spondylosis. No compression fractures.  IMPRESSION: 1. Enlargement with increased soft tissue density of the right upper lobe mass compared to prior examinations. 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 2. Thoracic surgery consultation is recommended for further management of the mass. Options include repeat PET-CT or tissue sampling. 3. These results were  called by telephone at the time of interpretation on 03/25/2013 at 2:14 PM to Dr. Hermelinda Ellis , who verbally acknowledged these results.   Electronically Signed By: Dereck Ligas M.D. On: 03/25/2013 14:17   Recent Lab Findings: Lab Results  Component Value Date   WBC 4.9 05/02/2013   HGB 11.7* 05/02/2013   HCT 35.4* 05/02/2013   PLT 192 05/02/2013   GLUCOSE 95 05/02/2013   CHOL 208* 08/13/2011   TRIG 65.0 08/13/2011   HDL 82.10 08/13/2011   LDLDIRECT 100.9 08/13/2011   LDLCALC 102* 03/07/2010   ALT 22 05/02/2013   AST 38* 05/02/2013   NA 136* 05/02/2013   K 4.0 05/02/2013   CL 94* 05/02/2013   CREATININE 0.90 05/02/2013   BUN 23 05/02/2013   CO2 30 05/02/2013   TSH 4.22 08/13/2011   INR 0.91 05/02/2013   PFTS: Patient had difficulty doing the PFTs, FEV1 was satisfactory she has significant diffusion capacity deficit but with some technical  problems in obtaining the study   Assessment / Plan:    Chronically appearing opacity in the upper portion of the right lung since at least 2010, on measurements the mass appears to be enlarging . I've reviewed the findings of the current PET scan  with the patient and and her daughter. The mass may be indolent carcinoma. I have recommended a tissue DX   ENB (electromagnetic navigational bronchoscopy) with BX is recommended . Unfortunately the raw data for the chest CT scan done several weeks ago [facility]  has already been erased. So to proceed with ENB, she has had   a SUPER D  repeat CT scan  Patient is on [place] which will be stopped now prior to bx and proceed Wednesday 3/18  . Iron deficiency anemia   . Sjogren's syndrome     [name]  . Abnormal ECG 11/2008  . Raynaud's phenomenon 1965  . Lupus     skin lupus  . Arthritis   . Colon polyps    The goals risks and alternatives of the planned surgical procedure Bronchoscopy, ENB, with lung biopsy  have been discussed with the patient in detail. The risks of the procedure including  death, infection, stroke, myocardial infarction, bleeding, blood transfusion have all been discussed specifically.  I have quoted [name] a 1 % of perioperative mortality and a complication rate as high as 10 %. The patient's questions have been answered.[name] is willing  to proceed with the planned procedure.    [name] MD      [address] [place], [address] Office [phone]   Beeper [phone]

## 2013-05-04 NOTE — Transfer of Care (Signed)
Immediate Anesthesia Transfer of Care Note  Patient: [name]  Procedure(s) Performed: Procedure(s): VIDEO BRONCHOSCOPY WITH ENDOBRONCHIAL NAVIGATION (N/A)  Patient Location: PACU  Anesthesia Type:General  Level of Consciousness: awake, alert , oriented and patient cooperative  Airway & Oxygen Therapy: Patient Spontanous Breathing and Patient connected to nasal cannula oxygen  Post-op Assessment: Report given to PACU RN, Post -op Vital signs reviewed and stable and Patient moving all extremities X 4  Post vital signs: Reviewed and stable  Complications: No apparent anesthesia complications

## 2013-05-04 NOTE — Anesthesia Preprocedure Evaluation (Addendum)
Anesthesia Evaluation  Patient identified by MRN, date of birth, ID band Patient awake    Reviewed: Allergy & Precautions, H&P , NPO status , Patient's Chart, lab work & pertinent test results  History of Anesthesia Complications (+) PONV  Airway Mallampati: II TM Distance: >3 FB Neck ROM: Full    Dental  (+) Edentulous Upper, Edentulous Lower, Dental Advisory Given   Pulmonary          Cardiovascular + Peripheral Vascular Disease     Neuro/Psych    GI/Hepatic   Endo/Other    Renal/GU      Musculoskeletal   Abdominal   Peds  Hematology  (+) anemia ,   Anesthesia Other Findings   Reproductive/Obstetrics                         Anesthesia Physical Anesthesia Plan  ASA: II  Anesthesia Plan: General   Post-op Pain Management:    Induction: Intravenous  Airway Management Planned: Oral ETT  Additional Equipment:   Intra-op Plan:   Post-operative Plan: Extubation in OR  Informed Consent: I have reviewed the patients History and Physical, chart, labs and discussed the procedure including the risks, benefits and alternatives for the proposed anesthesia with the patient or authorized representative who has indicated his/her understanding and acceptance.   Dental advisory given  Plan Discussed with: CRNA and Anesthesiologist  Anesthesia Plan Comments:      Anesthesia Quick Evaluation

## 2013-05-04 NOTE — Brief Op Note (Signed)
      [address]       [place] [address]             [phone]     05/04/2013  10:36 AM  PATIENT:  [name]  80 y.o. female  PRE-OPERATIVE DIAGNOSIS:   RIGHT LUNG MASS  POST-OPERATIVE DIAGNOSIS:  RIGHT LUNG MASS  PROCEDURE:  Procedure(s): VIDEO BRONCHOSCOPY WITH ENDOBRONCHIAL NAVIGATION (N/A) and Lung Biopsy  SURGEON:  Surgeon(s) and Role:    * [name], MD - Primary    ANESTHESIA:   general  EBL:  Total I/O In: 700 [I.V.:700] Out: -   BLOOD ADMINISTERED:none  DRAINS: none   LOCAL MEDICATIONS USED:  NONE  SPECIMEN:  Source of Specimen:  right upper lobe lesion lung, Path pending  DISPOSITION OF SPECIMEN:  PATHOLOGY  COUNTS:  YES  DICTATION: .Dragon Dictation  PLAN OF CARE: Discharge to home after PACU  PATIENT DISPOSITION:  PACU - hemodynamically stable.   Delay start of Pharmacological VTE agent (>24hrs) due to surgical blood loss or risk of bleeding: yes

## 2013-05-04 NOTE — Discharge Instructions (Signed)
Flexible Bronchoscopy Bronchoscopy is a procedure used to examine the passageways in the lungs. During the procedure a thin, flexible tool with a lens and camera or eyepiece is passed in your mouth or nose, down the windpipe (trachea), and into the air tubes (bronchi). This tool allows your health care provider to carefully look at your lungs from the inside and take diagnostic samples if needed.  LET [facility] CARE PROVIDER KNOW ABOUT:   Allergies to food or medicine.   All medicines you are taking, including blood thinners, vitamins, herbs, eye drops, creams, and over-the-counter medicines.   Previous problems you or members of your family have had with the use of anesthetics.   Any blood disorders you have.   Previous surgeries you have had.   Medical conditions you have, including heart disease, diabetes, or kidney problems.   Possibility of pregnancy, if this applies. RISKS AND COMPLICATIONS Generally, this is a safe procedure. However, as with any procedure, complications can occur. Possible complications include:   Collapsed lung (pneumothorax).  Bleeding.  Increased need for oxygen or difficulty breathing after the procedure. Serious complications may require overnight hospital stay. BEFORE THE PROCEDURE  Do not eat or drink anything 8 hours before the procedure or as directed by your health care provider.  PROCEDURE   Relax as much as possible during the procedure.  Medicines may be given to relax you, dry up your secretions, and control coughing.   A numbing medicine (local anesthetic) will be given to numb your mouth, nose, throat, and voice box (larynx). You will be able to breath normally during the procedure.   Samples of airway secretions may be collected for testing.  If abnormal areas are seen in your airways, tissue samples may be taken for examination under a microscope (biopsy).  If tissue samples are needed from the outer portions of the lung,  a type of X-ray called fluoroscopy may be done.   If bleeding occurs, a drug may be used to stop or decrease the bleeding.  AFTER THE PROCEDURE   You may receive a chest X-ray following the procedure. This is to make sure the lungs have not collapsed (pneumothorax).  Document Released: 02/01/2000 Document Revised: 11/24/2012 Document Reviewed: 10/08/2012 [facility] Patient Information 2014 [employer].

## 2013-05-04 NOTE — Anesthesia Postprocedure Evaluation (Signed)
  Anesthesia Post-op Note  Patient: [name]  Procedure(s) Performed: Procedure(s): VIDEO BRONCHOSCOPY WITH ENDOBRONCHIAL NAVIGATION (N/A)  Patient Location: PACU  Anesthesia Type:General  Level of Consciousness: awake, alert  and oriented  Airway and Oxygen Therapy: Patient Spontanous Breathing and Patient connected to nasal cannula oxygen  Post-op Pain: mild  Post-op Assessment: Post-op Vital signs reviewed, Patient's Cardiovascular Status Stable, Respiratory Function Stable, Patent Airway and Pain level controlled  Post-op Vital Signs: stable  Complications: No apparent anesthesia complications

## 2013-05-04 NOTE — Preoperative (Signed)
Beta Blockers   Reason not to administer Beta Blockers:Not Applicable 

## 2013-05-04 NOTE — Anesthesia Procedure Notes (Signed)
Procedure Name: Intubation Date/Time: 05/04/2013 8:37 AM Performed by: Carney Living Pre-anesthesia Checklist: Patient identified, Emergency Drugs available, Suction available, Patient being monitored and Timeout performed Patient Re-evaluated:Patient Re-evaluated prior to inductionOxygen Delivery Method: Circle system utilized Preoxygenation: Pre-oxygenation with 100% oxygen Intubation Type: IV induction Ventilation: Mask ventilation without difficulty and Oral airway inserted - appropriate to patient size Laryngoscope Size: Mac and 4 Grade View: Grade I Tube type: Oral Tube size: 8.0 mm Number of attempts: 1 Airway Equipment and Method: Stylet Placement Confirmation: ETT inserted through vocal cords under direct vision,  positive ETCO2 and breath sounds checked- equal and bilateral Secured at: 20 cm Tube secured with: Tape Dental Injury: Teeth and Oropharynx as per pre-operative assessment

## 2013-05-05 NOTE — Op Note (Signed)
[name], [name] NO.:  [id number]  MEDICAL RECORD NO.:  [id number]  LOCATION:  [facility]                         FACILITY:  [facility]  PHYSICIAN:  Lanelle Bal, MD    DATE OF BIRTH:  1931-01-10  DATE OF PROCEDURE:  05/04/2013 DATE OF DISCHARGE:  05/04/2013                              OPERATIVE REPORT   PREOPERATIVE DIAGNOSIS:  Right upper lobe lung mass.  POSTOPERATIVE DIAGNOSIS:  Right upper lobe lung mass.  SURGICAL PROCEDURES:  Bronchoscopy with electromagnetic navigational bronchoscopy and transbronchial biopsy of right upper lobe lung lesion.  SURGEON:  Lanelle Bal, MD  BRIEF HISTORY:  The patient is an 80 year old female who has had a known right upper lobe lung lesion for several years.  However on recent scans, the lesion was noted to be slightly enlarged on CT with mild metabolic activity on PET scan, suggestive of possible low-grade malignancy.  Biopsy was recommended to the patient to obtain tissue confirmation.  Risks and options were discussed with her proceeding with bronchoscopy with navigational bronchoscopy and transbronchial biopsies. She was agreeable with and signed informed consent.  DESCRIPTION OF PROCEDURE:  The patient was brought to the operating room in fasting state, and after being off Mobic for 7 days.  She underwent general endotracheal anesthesia without incident.  Appropriate time-out was performed.  We then proceeded with first video bronchoscopy.  The video bronchoscope was passed to the subsegmental level both the right and left tracheobronchial tree without evidence of endobronchial lesions.  The scope was then positioned and we proceeded with ENB. Prior to the procedure, a pathway plan was developed in the software. Adequate registration of the CT scan results was performed.  We then proceeded with positioning the working channel through the first pathway, which would took [place] through bronchial airways to the  more superior portion of the mass.  Needle brush, aspirations, and biopsies were obtained.  Initial cytologic material was sent to Pathology.  While this was being reviewed, we repositioned our working channel to the second pathway to the same lesion, which took [place] slightly more inferior portion of the lesion.  Again, brushings, aspirations and biopsies were obtained.  From the second biopsy site, the material obtained for cytology was placed in satellite fluid.  The initial smears were reported back by Pathology as adequate.  Diagnostic material, atypical cells were noted.  Further study of the material was recommended by Pathology, but the pathologist felt there was sufficient material to work with. We then removed the scope.  The patient was awakened in the operating room and extubated, and transferred to the recovery room in good condition.  There was no significant bleeding from the biopsy in the tracheobronchial tree at the completion of the procedure.     Lanelle Bal, MD     EG/MEDQ  D:  05/05/2013  T:  05/05/2013  Job:  [id number]

## 2013-05-06 ENCOUNTER — Encounter (HOSPITAL_COMMUNITY): Payer: Self-pay | Admitting: Cardiothoracic Surgery

## 2013-05-06 ENCOUNTER — Ambulatory Visit (INDEPENDENT_AMBULATORY_CARE_PROVIDER_SITE_OTHER): Payer: Medicare PPO | Admitting: Cardiothoracic Surgery

## 2013-05-06 VITALS — BP 116/65 | HR 86 | Resp 16 | Ht 63.0 in | Wt 106.0 lb

## 2013-05-06 DIAGNOSIS — C3410 Malignant neoplasm of upper lobe, unspecified bronchus or lung: Secondary | ICD-10-CM

## 2013-05-06 DIAGNOSIS — Z9889 Other specified postprocedural states: Secondary | ICD-10-CM

## 2013-05-08 DIAGNOSIS — C3410 Malignant neoplasm of upper lobe, unspecified bronchus or lung: Secondary | ICD-10-CM | POA: Insufficient documentation

## 2013-05-08 NOTE — Progress Notes (Signed)
[address]       [place] [address]             [phone]                           [name] [place] Medical Record #[id number] Date of Birth: 01-12-1931  Referring: [name], MD Primary Care: [name], MD  Chief Complaint:    Chief Complaint  Patient presents with  . Lung Mass    f/u ENB and discuss results of biopsies    History of Present Illness:    [name] 80 y.o. female is seen in the office  today for  evaluation of right upper lung mass. The patient was previously followed by [name] with   [name] for a right upper lobe lung opacity. The patient was last seen in 2011 at which time a CT scan of the chest had been done. PET scan in 2010 was not hypermetabolic. As the patient was being evaluated for her rheumatologic disease a chest x-ray was performed and repeat CT scan. The patient was referred to thoracic surgery for further evaluation of the right upper lung opacity. The patient has no previous history of tuberculosis or exposure to tuberculosis, she has chronically been on Plaquenil and Mobic but denies any long-term steroid or Enbral usage. She has a history of Raynaud phenomenon and underwent an open left thoracic sympathectomy by Dr. 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 She is a nonsmoker.  The patient returns to the office today after  ENB/Biopsy of right upper lobe lung lesion. She tolerated procedure without difficulty. She returns today with her son in law to discuss results.   Current Activity/ Functional Status:  Patient is independent with mobility/ambulation, transfers, ADL's, IADL's.   Zubrod Score: At the time of surgery this patient's most appropriate activity status/level should be described as: []     0    Normal activity, no symptoms [x]     1    Restricted in physical strenuous activity but ambulatory, able to do out light work []     2    Ambulatory  and capable of self care, unable to do work activities, up and about               >50 % of waking hours                              []     3    Only limited self care, in bed greater than 50% of waking hours []     4    Completely disabled, no self care, confined to bed or chair []     5    Moribund   Past Medical History  Diagnosis Date  . Iron deficiency anemia   . Sjogren's syndrome     Dr. Abner Greenspan  . Abnormal ECG 11/2008    left bbb  . Raynaud's phenomenon 1965  . Lupus     skin lupus  . Arthritis   . Colon polyps   . PONV (postoperative nausea and vomiting)     Past Surgical History  Procedure Laterality Date  . Appendectomy    . Tonsillectomy    . Total abdominal hysterectomy  1980's   . Thoracic sympathetectomy  1965  . Cataract extraction Bilateral   .  Colonoscopy    . Cardiovascular stress test      12/2008 mild fixed basal to mid septal perfusion defect felt likely due to artifact from LBBB, no ischemia, EF 58%  . Video bronchoscopy with endobronchial navigation N/A 05/04/2013    Procedure: VIDEO BRONCHOSCOPY WITH ENDOBRONCHIAL NAVIGATION;  Surgeon: [name], MD;  Location: [facility] OR;  Service: Thoracic;  Laterality: N/A;    Family History  Problem Relation Age of Onset  . Coronary artery disease Father   . Colon cancer Father   . Diabetes Father   . Esophageal cancer Neg Hx   . Kidney disease Neg Hx   . Liver disease Neg Hx     History   Social History  . Marital Status: Married    Spouse Name: N/A    Number of Children: N/A  . Years of Education: N/A   Occupational History  . Not on file.   Social History Main Topics  . Smoking status: Never Smoker   . Smokeless tobacco: Never Used  . Alcohol Use: Yes     Comment: rare/social  . Drug Use: No  . Sexual Activity: Not on file   Other Topics Concern  . Not on file   Social History Narrative   Lives with husband, daughter and grandchild local.    History  Smoking status  . Never Smoker     Smokeless tobacco  . Never Used    History  Alcohol Use  . Yes    Comment: rare/social     Allergies  Allergen Reactions  . Prochlorperazine Edisylate Anaphylaxis    Compazine  . Aspirin     REACTION: nose bleeds    Current Outpatient Prescriptions  Medication Sig Dispense Refill  . acetaminophen (TYLENOL) 500 MG tablet Take 500 mg by mouth every 6 (six) hours as needed for mild pain.      . Biotin 1000 MCG tablet Take 1,000 mcg by mouth 2 (two) times daily.        . Calcium Carbonate-Vitamin D (CALCIUM 500 + D) 500-125 MG-UNIT TABS Take 1 tablet by mouth daily.       Marland Kitchen docusate sodium (STOOL SOFTENER) 100 MG capsule Take 100 mg by mouth 2 (two) times daily.        Marland Kitchen estradiol (ESTRACE) 0.1 MG/GM vaginal cream Place 1 Applicatorful vaginally 2 (two) times a week. On Wednesday and Sunday.      . gabapentin (NEURONTIN) 600 MG tablet Take 600 mg by mouth 3 (three) times daily.      . Glucosamine-Chondroit-Vit C-Mn (GLUCOSAMINE CHONDR 1500 COMPLX PO) Take 1 capsule by mouth daily.       . hydroxychloroquine (PLAQUENIL) 200 MG tablet Take 200 mg by mouth 2 (two) times daily.       . meloxicam (MOBIC) 15 MG tablet Take 15 mg by mouth daily.        . Multiple Vitamin (MULTIVITAMIN) tablet Take 1 tablet by mouth daily.        Marland Kitchen omeprazole (PRILOSEC) 20 MG capsule Take 1 capsule (20 mg total) by mouth 2 (two) times daily before a meal.  60 capsule  11  . Polyethyl Glycol-Propyl Glycol (SYSTANE OP) Place 1 drop into both eyes 2 (two) times daily as needed (dry eyes).       No current facility-administered medications for this visit.     Review of Systems:     Cardiac Review of Systems: Y or N  Chest Pain [  n  ]  Resting SOB [ n  ] Exertional SOB  [ n ]  Orthopnea [  n]   Pedal Edema [  n ]    Palpitations [n  ] Syncope  [n  ]   Presyncope [  n ]  General Review of Systems: [Y] = yes [  ]=no Constitional: recent weight change [  y];  Wt loss over the last 3 months [   ] anorexia [   ]; fatigue [name]  ]; nausea [  ]; night sweats [  ]; fever [  ]; or chills [  ];          Dental: poor dentition[dentures  ]; Last Dentist visit:   Eye : blurred vision [  ]; diplopia [   ]; vision changes [  ];  Amaurosis fugax[  ]; Resp: cough [  ];  wheezing[  ];  hemoptysis[  ]; shortness of breath[  ]; paroxysmal nocturnal dyspnea[  ]; dyspnea on exertion[  ]; or orthopnea[  ];  GI:  gallstones[  ], vomiting[  ];  dysphagia[  ]; melena[  ];  hematochezia [  ]; heartburn[y  ];   Hx of  Colonoscopy[  ]; GU: kidney stones [  ]; hematuria[  ];   dysuria [  ];  nocturia[  ];  history of     obstruction [  ]; urinary frequency [  ]             Skin: rash, swelling[  ];, hair loss[ y ];  peripheral edema[ y ];  or itching[  ]; Musculosketetal: myalgias[y  ];  joint swelling[ y ];  joint erythema[  y];  joint pain[ y ];  back pain[ y ];  Heme/Lymph: bruising[  ];  bleeding[n  ];  anemia[  ];  Neuro: TIA[  ];  headaches[  ];  stroke[  ];  vertigo[  ];  seizures[ n ];   paresthesias[  ];  difficulty walking[  ];  Psych:depression[  ]; anxiety[ y ];  Endocrine: diabetes[n  ];  thyroid dysfunction[n  ];  Immunizations: Flu up to date [name]  ]; Pneumococcal up to date [name]  ];  Other:  Physical Exam: BP 116/65  Pulse 86  Resp 16  Ht 5\' 3"  (1.6 m)  Wt 106 lb (48.081 kg)  BMI 18.78 kg/m2  SpO2 96%  PHYSICAL EXAMINATION:  General appearance: alert, cooperative, appears stated age and cachectic Neurologic: intact Heart: regular rate and rhythm, S1, S2 normal, no murmur, click, rub or gallop Lungs: clear to auscultation bilaterally Abdomen: soft, non-tender; bowel sounds normal; no masses,  no organomegaly Extremities: Patient has evidence of scoliosis, has significant deformity of her joints of her fingers hands feet,  Well-healed left thoracotomy incision She has no cervical or supraclavicular adenopathy no carotid bruits,   Diagnostic Studies & Laboratory data:     Recent Radiology Findings: Nm  Pet Image Initial (pi) Skull Base To Thigh  04/22/2013   CLINICAL DATA:  Subsequent treatment strategy for lung mass .  EXAM: NUCLEAR MEDICINE PET SKULL BASE TO THIGH  FASTING BLOOD GLUCOSE:  Value: 80 mg/dl  TECHNIQUE: 7.1 mCi F-18 FDG was injected intravenously. Full-ring PET imaging was performed from the skull base to thigh after the radiotracer. CT data was obtained and used for attenuation correction and anatomic localization.  COMPARISON:  DG BONE DENSITY dated 11/05/2011; NM PET IMAGE INITIAL (PI) SKULL BASE TO THIGH dated 12/26/2008  FINDINGS: NECK  No hypermetabolic lymph nodes in  the neck.  CHEST  The irregular consolidative right upper lobe mass is increased in metabolic activity compared to prior with SUV max 4.5 compared to SUV max 2.7 on prior. This masses is also increased in size measuring 4.2 x 2.0 cm compared to 3.4 by 1.7 cm on prior PET / CT 12/26/2008. No additional hypermetabolic pulmonary nodules. No hypermetabolic mediastinal lymph nodes.  ABDOMEN/PELVIS  No abnormal hypermetabolic activity within the liver, pancreas, adrenal glands, or spleen. No hypermetabolic lymph nodes in the abdomen or pelvis.  SKELETON  Mild activity associated with the spinous process of the L5 vertebral body which is felt to be post traumatic / degenerative.  IMPRESSION: 1. Moderate metabolic activity associated with right upper lobe mass which is increased intensity and size gradually over time is concerning for an indolent neoplasm such as low-grade adenocarcinoma. Recommend percutaneous biopsy. 2. No evidence of metastasis. As of 03/28/2013 [facility] utilizes time-of-flight PET imaging. This new technology can increase SUV measurements by 16- 20 % over conventional PET technology.   Electronically Signed   By: Suzy Bouchard M.D.   On: 04/22/2013 15:16    RADIOLOGY REPORT*  Clinical Data: Chest wall pain. Rule out rib abnormality. Knot  midsternum with history of Sjogren's syndrome, lupus and  Raynaud's  phenomenon  CHEST - 2 VIEW  Comparison: 12/12/2008  Findings: Pulmonary hyperinflation is noted and taking this into  consideration heart and mediastinal contours are stable. There is  an irregular density identified in the right upper lobe which  appears radiographically stable since 2010 making this most likely  to represent a chronic infectious or inflammatory process. A slow  growing malignant etiology is not excluded with certainty and  improved assessment for stability could be obtained with the repeat  chest CT.  No new focal infiltrates, signs of congestive failure, pleural  fluid or significant peribronchial cuffing is identified.  Surgical clips are identified in the left paratracheal region and  bony structures appear intact. No definite rib abnormality is seen  IMPRESSION:  Unchanged irregular mass-like infiltrative process in the right  upper lobe appears radiographically stable suggesting chronic  etiology however a slow growing or indolent neoplastic process is  not excluded. Improved assessment for stability can be obtained  with repeat chest CT.  No new findings identified  Original Report Authenticated By: Ander Gaster  CT scan of chest:   CLINICAL DATA: Abnormal chest x-ray.  EXAM: CT CHEST WITH CONTRAST  TECHNIQUE: Multidetector CT imaging of the chest was performed during intravenous contrast administration.  CONTRAST: 90 mL Omnipaque 300.  COMPARISON: DG THORACIC SPINE W/ SWIMMERS 3V dated 03/22/2013; CT CHEST W/O CM dated 03/26/2009; NM PET IMAGE INITIAL (PI) SKULL BASE TO THIGH dated 12/26/2008  FINDINGS: The right upper lobe mass has increased in size although the measurements are only slightly increased, there is more soft tissue density. This study is also better resolution than on prior. Craniocaudal, the mass measures 6.2 cm, previously 4.3 cm. There is increasing spiculation around the mass and a central low-attenuation regions  suggesting necrosis. On axial imaging, the mass measures 2 cm x 3.5 cm. Cicatricial changes nearby appear more pronounced. Tortuous thoracic aorta is present. No acute vascular abnormality. No pericardial or pleural effusion. There is no axillary adenopathy. No mediastinal or hilar adenopathy. Incidental imaging of the upper abdomen is within normal limits. No adrenal masses are identified. Aortic atherosclerosis is present. Severe upper lumbar spondylosis. No compression fractures.  IMPRESSION: 1. Enlargement with increased soft  tissue density of the right upper lobe mass compared to prior examinations. 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 2. Thoracic surgery consultation is recommended for further management of the mass. Options include repeat PET-CT or tissue sampling. 3. These results were called by telephone at the time of interpretation on 03/25/2013 at 2:14 PM to Dr. [name] , who verbally acknowledged these results.   Electronically Signed By: Dereck Ligas M.D. On: 03/25/2013 14:17   Recent Lab Findings: Lab Results  Component Value Date   WBC 4.9 05/02/2013   HGB 11.7* 05/02/2013   HCT 35.4* 05/02/2013   PLT 192 05/02/2013   GLUCOSE 95 05/02/2013   CHOL 208* 08/13/2011   TRIG 65.0 08/13/2011   HDL 82.10 08/13/2011   LDLDIRECT 100.9 08/13/2011   LDLCALC 102* 03/07/2010   ALT 22 05/02/2013   AST 38* 05/02/2013   NA 136* 05/02/2013   K 4.0 05/02/2013   CL 94* 05/02/2013   CREATININE 0.90 05/02/2013   BUN 23 05/02/2013   CO2 30 05/02/2013   TSH 4.22 08/13/2011   INR 0.91 05/02/2013   PFTS: Patient had difficulty doing the PFTs, FEV1 was satisfactory she has significant diffusion capacity deficit but with some technical problems in obtaining the study  Path: [id number] [id number] right upper lobe 1. Lung, biopsy, Right upper lobe - BENIGN LUNG  TISSUE. - NO GRANULOMAS OR MALIGNANCY. 2. Lung, biopsy, Right upper lobe - ADENOCARCINOMA. Microscopic Comment 2. Dr. Donato Heinz agrees. (JDP:caf 05/05/13) Claudette Laws MD [facility] requested   Assessment / Plan:    Chronically appearing opacity in the upper portion of the right lung since at least 2010, on measurements the mass appears to be enlarging .  ENB (electromagnetic navigational bronchoscopy) with BX is recommended was done and confirms adenocarcinoma of the lung.  Stage IIA (cT2a,cN0,cM0) I have discussed with the patient right upper lobectomy vs radiation therapy as treatment options.  She will stay off [place] pending decision. She will return to [facility] to discuss with Radiation Therapy options pending her decision about proceeding with surgical resection. Spent 45 min discussing with patient and son-in-law treatment options     . Iron deficiency anemia   . Sjogren's syndrome     Dr. Abner Greenspan  . Abnormal ECG 11/2008  . Raynaud's phenomenon 1965  . Lupus     skin lupus  . Arthritis   . Colon polyps       [name] MD      [address] [place] [address] Office [phone]   [place] [phone]

## 2013-05-09 ENCOUNTER — Encounter: Payer: Self-pay | Admitting: *Deleted

## 2013-05-09 ENCOUNTER — Telehealth: Payer: Self-pay | Admitting: *Deleted

## 2013-05-09 NOTE — [facility] Oncology Navigator Note (Unsigned)
Pt called. Spoke with her about appt for [place].  She verbalized understanding of appt time and place.

## 2013-05-09 NOTE — Telephone Encounter (Signed)
Called pt with appt for [place] 05/12/13 at 12:45.  Left my name and phone number to call for more information

## 2013-05-12 ENCOUNTER — Ambulatory Visit: Payer: Medicare PPO | Admitting: Radiation Oncology

## 2013-05-12 ENCOUNTER — Ambulatory Visit: Payer: Medicare PPO | Admitting: Physical Therapy

## 2013-05-16 ENCOUNTER — Other Ambulatory Visit: Payer: Self-pay | Admitting: *Deleted

## 2013-05-16 DIAGNOSIS — R918 Other nonspecific abnormal finding of lung field: Secondary | ICD-10-CM

## 2013-05-23 ENCOUNTER — Encounter (HOSPITAL_COMMUNITY): Payer: Self-pay | Admitting: Pharmacy Technician

## 2013-06-02 ENCOUNTER — Encounter (HOSPITAL_COMMUNITY)
Admission: RE | Admit: 2013-06-02 | Discharge: 2013-06-02 | Disposition: A | Discharge status: Home / Self Care | Payer: Medicare PPO | Source: Ambulatory Visit | Attending: Cardiothoracic Surgery | Admitting: Cardiothoracic Surgery

## 2013-06-02 ENCOUNTER — Encounter (HOSPITAL_COMMUNITY): Payer: Self-pay

## 2013-06-02 VITALS — BP 120/69 | HR 103 | Temp 98.5°F | Resp 20 | Ht 64.0 in | Wt 105.8 lb

## 2013-06-02 DIAGNOSIS — Z01812 Encounter for preprocedural laboratory examination: Secondary | ICD-10-CM | POA: Insufficient documentation

## 2013-06-02 DIAGNOSIS — R918 Other nonspecific abnormal finding of lung field: Secondary | ICD-10-CM

## 2013-06-02 DIAGNOSIS — Z01810 Encounter for preprocedural cardiovascular examination: Secondary | ICD-10-CM | POA: Insufficient documentation

## 2013-06-02 HISTORY — DX: Dry eye syndrome of bilateral lacrimal glands: H04.123

## 2013-06-02 HISTORY — DX: Age-related osteoporosis without current pathological fracture: M81.0

## 2013-06-02 HISTORY — DX: Sciatica, unspecified side: M54.30

## 2013-06-02 HISTORY — DX: Gastro-esophageal reflux disease without esophagitis: K21.9

## 2013-06-02 HISTORY — DX: Rheumatoid arthritis, unspecified: M06.9

## 2013-06-02 LAB — COMPREHENSIVE METABOLIC PANEL
ALT: 20 U/L (ref 0–35)
AST: 32 U/L (ref 0–37)
Albumin: 3.7 g/dL (ref 3.5–5.2)
Alkaline Phosphatase: 63 U/L (ref 39–117)
BUN: 24 mg/dL — ABNORMAL HIGH (ref 6–23)
CO2: 24 mEq/L (ref 19–32)
Calcium: 9.5 mg/dL (ref 8.4–10.5)
Chloride: 96 mEq/L (ref 96–112)
Creatinine, Ser: 1.04 mg/dL (ref 0.50–1.10)
GFR calc Af Amer: 57 mL/min — ABNORMAL LOW (ref >90)
GFR calc non Af Amer: 49 mL/min — ABNORMAL LOW (ref >90)
Glucose, Bld: 102 mg/dL — ABNORMAL HIGH (ref 70–99)
Potassium: 4.5 mEq/L (ref 3.7–5.3)
Sodium: 135 mEq/L — ABNORMAL LOW (ref 137–147)
Total Bilirubin: <0.2 mg/dL — ABNORMAL LOW (ref 0.3–1.2)
Total Protein: 7.1 g/dL (ref 6.0–8.3)

## 2013-06-02 LAB — BLOOD GAS, ARTERIAL
Acid-Base Excess: 2.9 mmol/L — ABNORMAL HIGH (ref 0.0–2.0)
Bicarbonate: 26.8 mEq/L — ABNORMAL HIGH (ref 20.0–24.0)
Drawn by: 181601
FIO2: 0.21 %
O2 Saturation: 98.2 %
Patient temperature: 98.6
TCO2: 28.1 mmol/L (ref 0–100)
pCO2 arterial: 40.3 mmHg (ref 35.0–45.0)
pH, Arterial: 7.438 (ref 7.350–7.450)
pO2, Arterial: 99.4 mmHg (ref 80.0–100.0)

## 2013-06-02 LAB — PROTIME-INR
INR: 0.89 (ref 0.00–1.49)
Prothrombin Time: 11.9 seconds (ref 11.6–15.2)

## 2013-06-02 LAB — URINALYSIS, ROUTINE W REFLEX MICROSCOPIC
Bilirubin Urine: NEGATIVE
Glucose, UA: NEGATIVE mg/dL
Hgb urine dipstick: NEGATIVE
Ketones, ur: NEGATIVE mg/dL
Leukocytes, UA: NEGATIVE
Nitrite: NEGATIVE
Protein, ur: NEGATIVE mg/dL
Specific Gravity, Urine: 1.017 (ref 1.005–1.030)
Urobilinogen, UA: 0.2 mg/dL (ref 0.0–1.0)
pH: 6 (ref 5.0–8.0)

## 2013-06-02 LAB — CBC
HCT: 33.1 % — ABNORMAL LOW (ref 36.0–46.0)
Hemoglobin: 10.8 g/dL — ABNORMAL LOW (ref 12.0–15.0)
MCH: 29 pg (ref 26.0–34.0)
MCHC: 32.6 g/dL (ref 30.0–36.0)
MCV: 88.7 fL (ref 78.0–100.0)
Platelets: 190 10*3/uL (ref 150–400)
RBC: 3.73 MIL/uL — ABNORMAL LOW (ref 3.87–5.11)
RDW: 13.2 % (ref 11.5–15.5)
WBC: 5 10*3/uL (ref 4.0–10.5)

## 2013-06-02 LAB — SURGICAL PCR SCREEN
MRSA, PCR: POSITIVE — AB
Staphylococcus aureus: POSITIVE — AB

## 2013-06-02 LAB — APTT: aPTT: 27 seconds (ref 24–37)

## 2013-06-02 NOTE — Progress Notes (Signed)
Mupirocin script called into [facility] on [address]

## 2013-06-02 NOTE — Pre-Procedure Instructions (Signed)
[name]  06/02/2013   Your procedure is scheduled on:  Tues, April 21 @ 7:30 AM  Report to [facility]  at 5:30 AM.  Call this number if you have problems the morning of surgery: [phone]   Remember:   Do not eat food or drink liquids after midnight.   Take these medicines the morning of surgery with A SIP OF WATER: Gabapentin(Neurontin) and Omeprazole(Prilosec)               Stop taking your Mobic. No Goody's,BC's,Aleve,Aspirin,Ibuprofen,Fish Oil,or any Herbal Medications   Do not wear jewelry, make-up or nail polish.  Do not wear lotions, powders, or perfumes.   Do not shave 48 hours prior to surgery.   Do not bring valuables to the hospital.  [facility] is not responsible                  for any belongings or valuables.               Contacts, dentures or bridgework may not be worn into surgery.  Leave suitcase in the car. After surgery it may be brought to your room.  For patients admitted to the hospital, discharge time is determined by your                treatment team.                Special Instructions:  [facility] - Preparing for Surgery  Before surgery, you can play an important role.  Because skin is not sterile, your skin needs to be as free of germs as possible.  You can reduce the number of germs on you skin by washing with CHG (chlorahexidine gluconate) soap before surgery.  CHG is an antiseptic cleaner which kills germs and bonds with the skin to continue killing germs even after washing.  Please DO NOT use if you have an allergy to CHG or antibacterial soaps.  If your skin becomes reddened/irritated stop using the CHG and inform your nurse when you arrive at Short Stay.  Do not shave (including legs and underarms) for at least 48 hours prior to the first CHG shower.  You may shave your face.  Please follow these instructions carefully:   1.  Shower with CHG Soap the night before surgery and the                                morning of  Surgery.  2.  If you choose to wash your hair, wash your hair first as usual with your       normal shampoo.  3.  After you shampoo, rinse your hair and body thoroughly to remove the                      Shampoo.  4.  Use CHG as you would any other liquid soap.  You can apply chg directly       to the skin and wash gently with scrungie or a clean washcloth.  5.  Apply the CHG Soap to your body ONLY FROM THE NECK DOWN.        Do not use on open wounds or open sores.  Avoid contact with your eyes,       ears, mouth and genitals (private parts).  Wash genitals (private parts)       with your normal  soap.  6.  Wash thoroughly, paying special attention to the area where your surgery        will be performed.  7.  Thoroughly rinse your body with warm water from the neck down.  8.  DO NOT shower/wash with your normal soap after using and rinsing off       the CHG Soap.  9.  Pat yourself dry with a clean towel.            10.  Wear clean pajamas.            11.  Place clean sheets on your bed the night of your first shower and do not        sleep with pets.  Day of Surgery  Do not apply any lotions/deoderants the morning of surgery.  Please wear clean clothes to the hospital/surgery center.     Please read over the following fact sheets that you were given: Pain Booklet, Coughing and Deep Breathing, Blood Transfusion Information, MRSA Information and Surgical Site Infection Prevention

## 2013-06-02 NOTE — Progress Notes (Signed)
Last note from [place] in epic from 2010  Echo report in epic from 2010  Stress test in epic from 2012  Denies ever having a heart cath  CXR DOS   Dr.Tabori is medical md

## 2013-06-03 NOTE — Progress Notes (Signed)
Anesthesia chart review: Patient is a 80 year old female scheduled for video bronchoscopy, right video-assisted thorascopy, lung resection on 06/07/13 by Dr. Servando Snare.She is s/p video bronchoscopy with endobronchial navigation for evaluation of a RUL mass on 05/04/13 which was positive for adenocarcinoma.   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 PCP is Dr. [name]. Rheumatologist is Dr. Gerilyn Nestle with [facility].   EKG on 05/02/13 showed NSR, left BBB. She was diagnosed with a left BBB in 11/2008. Dr. Birdie Riddle ordered an echo and referred her to cardiologist Dr. Haroldine Laws. She subsequently had a nuclear stress test (signed on 12/25/08) that showed "EF 58% with fixed septal defect likely due to LBBB. No ischemia." PRN cardiology follow-up was recommended.   Echo on 12/11/08 showed: Normal LV size with mild global systolic dysfunction, EF 49-70%. Normal RV size and systolic function. Mild aortic insufficiency. Mild mitral insufficiency.  Preoperative labs noted. T&S specimen will need to be repeated on arrival (Blood Bank rejected her PAT specimen.)  She is for a CXR on the day of surgery.  If no acute changes then I anticipate that she can proceed as as planned.  [facility] Short Stay Center/Anesthesiology Phone [phone] 06/03/2013 11:43 AM

## 2013-06-06 MED ORDER — DEXTROSE 5 % IV SOLN
1.5000 g | INTRAVENOUS | Status: AC
  Administered 2013-06-07 (×2): 1.5 g via INTRAVENOUS
  Filled 2013-06-06: qty 1.5

## 2013-06-07 ENCOUNTER — Inpatient Hospital Stay (HOSPITAL_COMMUNITY)
Admission: RE | Admit: 2013-06-07 | Discharge: 2013-06-13 | DRG: 163 | Disposition: A | Discharge status: Home / Self Care | Payer: Medicare PPO | Source: Ambulatory Visit | Attending: Cardiothoracic Surgery | Admitting: Cardiothoracic Surgery

## 2013-06-07 ENCOUNTER — Encounter (HOSPITAL_COMMUNITY): Payer: Medicare PPO | Admitting: Vascular Surgery

## 2013-06-07 ENCOUNTER — Inpatient Hospital Stay (HOSPITAL_COMMUNITY): Payer: Medicare PPO | Admitting: Anesthesiology

## 2013-06-07 ENCOUNTER — Inpatient Hospital Stay (HOSPITAL_COMMUNITY): Payer: Medicare PPO

## 2013-06-07 ENCOUNTER — Encounter (HOSPITAL_COMMUNITY)
Admission: RE | Disposition: A | Discharge status: Home / Self Care | Payer: Self-pay | Source: Ambulatory Visit | Attending: Cardiothoracic Surgery

## 2013-06-07 ENCOUNTER — Encounter (HOSPITAL_COMMUNITY): Payer: Self-pay | Admitting: *Deleted

## 2013-06-07 DIAGNOSIS — C3410 Malignant neoplasm of upper lobe, unspecified bronchus or lung: Secondary | ICD-10-CM

## 2013-06-07 DIAGNOSIS — K5900 Constipation, unspecified: Secondary | ICD-10-CM | POA: Diagnosis not present

## 2013-06-07 DIAGNOSIS — K219 Gastro-esophageal reflux disease without esophagitis: Secondary | ICD-10-CM | POA: Diagnosis present

## 2013-06-07 DIAGNOSIS — E43 Unspecified severe protein-calorie malnutrition: Secondary | ICD-10-CM | POA: Diagnosis present

## 2013-06-07 DIAGNOSIS — Z860100 Personal history of colon polyps, unspecified: Secondary | ICD-10-CM

## 2013-06-07 DIAGNOSIS — M329 Systemic lupus erythematosus, unspecified: Secondary | ICD-10-CM | POA: Diagnosis present

## 2013-06-07 DIAGNOSIS — J95811 Postprocedural pneumothorax: Secondary | ICD-10-CM | POA: Diagnosis not present

## 2013-06-07 DIAGNOSIS — M810 Age-related osteoporosis without current pathological fracture: Secondary | ICD-10-CM | POA: Diagnosis present

## 2013-06-07 DIAGNOSIS — Z681 Body mass index (BMI) 19 or less, adult: Secondary | ICD-10-CM

## 2013-06-07 DIAGNOSIS — D62 Acute posthemorrhagic anemia: Secondary | ICD-10-CM | POA: Diagnosis not present

## 2013-06-07 DIAGNOSIS — C3490 Malignant neoplasm of unspecified part of unspecified bronchus or lung: Secondary | ICD-10-CM | POA: Diagnosis present

## 2013-06-07 DIAGNOSIS — M4120 Other idiopathic scoliosis, site unspecified: Secondary | ICD-10-CM | POA: Diagnosis present

## 2013-06-07 DIAGNOSIS — I7300 Raynaud's syndrome without gangrene: Secondary | ICD-10-CM | POA: Diagnosis present

## 2013-06-07 DIAGNOSIS — M3500 Sicca syndrome, unspecified: Secondary | ICD-10-CM | POA: Diagnosis present

## 2013-06-07 DIAGNOSIS — R918 Other nonspecific abnormal finding of lung field: Secondary | ICD-10-CM

## 2013-06-07 DIAGNOSIS — M069 Rheumatoid arthritis, unspecified: Secondary | ICD-10-CM | POA: Diagnosis present

## 2013-06-07 HISTORY — PX: LYMPH NODE DISSECTION: SHX5087

## 2013-06-07 HISTORY — PX: VIDEO ASSISTED THORACOSCOPY (VATS)/WEDGE RESECTION: SHX6174

## 2013-06-07 HISTORY — PX: THORACOTOMY: SHX5074

## 2013-06-07 HISTORY — PX: VIDEO BRONCHOSCOPY: SHX5072

## 2013-06-07 LAB — POCT I-STAT 7, (LYTES, BLD GAS, ICA,H+H)
Bicarbonate: 26.1 mEq/L — ABNORMAL HIGH (ref 20.0–24.0)
Calcium, Ion: 1.1 mmol/L — ABNORMAL LOW (ref 1.13–1.30)
HCT: 24 % — ABNORMAL LOW (ref 36.0–46.0)
Hemoglobin: 8.2 g/dL — ABNORMAL LOW (ref 12.0–15.0)
O2 Saturation: 100 %
Patient temperature: 35.8
Potassium: 3.8 mEq/L (ref 3.7–5.3)
Sodium: 134 mEq/L — ABNORMAL LOW (ref 137–147)
TCO2: 28 mmol/L (ref 0–100)
pCO2 arterial: 45.7 mmHg — ABNORMAL HIGH (ref 35.0–45.0)
pH, Arterial: 7.359 (ref 7.350–7.450)
pO2, Arterial: 288 mmHg — ABNORMAL HIGH (ref 80.0–100.0)

## 2013-06-07 LAB — BLOOD PRODUCT ORDER (VERBAL) VERIFICATION

## 2013-06-07 LAB — ABO/RH: ABO/RH(D): A POS

## 2013-06-07 SURGERY — BRONCHOSCOPY, VIDEO-ASSISTED
Anesthesia: General | Site: Chest | Laterality: Right

## 2013-06-07 MED ORDER — DEXAMETHASONE SODIUM PHOSPHATE 4 MG/ML IJ SOLN
INTRAMUSCULAR | Status: DC | PRN
  Administered 2013-06-07: 8 mg via INTRAVENOUS

## 2013-06-07 MED ORDER — CALCIUM CARBONATE-VITAMIN D 500-125 MG-UNIT PO TABS: 1.0000 | ORAL_TABLET | Freq: Every day | ORAL | Status: DC

## 2013-06-07 MED ORDER — ONDANSETRON HCL 4 MG/2ML IJ SOLN
4.0000 mg | Freq: Four times a day (QID) | INTRAMUSCULAR | Status: DC | PRN
  Administered 2013-06-07 – 2013-06-13 (×8): 4 mg via INTRAVENOUS
  Filled 2013-06-07 (×9): qty 2

## 2013-06-07 MED ORDER — PROPOFOL 10 MG/ML IV BOLUS
INTRAVENOUS | Status: AC
  Filled 2013-06-07: qty 20

## 2013-06-07 MED ORDER — DEXAMETHASONE SODIUM PHOSPHATE 4 MG/ML IJ SOLN
INTRAMUSCULAR | Status: AC
  Filled 2013-06-07: qty 2

## 2013-06-07 MED ORDER — GLYCOPYRROLATE 0.2 MG/ML IJ SOLN
INTRAMUSCULAR | Status: AC
  Filled 2013-06-07: qty 2

## 2013-06-07 MED ORDER — ONDANSETRON HCL 4 MG/2ML IJ SOLN
INTRAMUSCULAR | Status: DC | PRN
  Administered 2013-06-07 (×2): 4 mg via INTRAVENOUS

## 2013-06-07 MED ORDER — ACETAMINOPHEN 500 MG PO TABS
1000.0000 mg | ORAL_TABLET | Freq: Four times a day (QID) | ORAL | Status: AC
  Administered 2013-06-07 – 2013-06-12 (×14): 1000 mg via ORAL
  Filled 2013-06-07 (×20): qty 2

## 2013-06-07 MED ORDER — DEXTROSE 5 % IV SOLN
1.5000 g | Freq: Two times a day (BID) | INTRAVENOUS | Status: AC
  Administered 2013-06-07 – 2013-06-08 (×2): 1.5 g via INTRAVENOUS
  Filled 2013-06-07 (×2): qty 1.5

## 2013-06-07 MED ORDER — ONDANSETRON HCL 4 MG/2ML IJ SOLN
INTRAMUSCULAR | Status: AC
  Filled 2013-06-07: qty 2

## 2013-06-07 MED ORDER — FENTANYL 10 MCG/ML IV SOLN
INTRAVENOUS | Status: DC
  Administered 2013-06-07: 10 ug via INTRAVENOUS
  Administered 2013-06-07: 15:00:00 via INTRAVENOUS
  Administered 2013-06-07: 28 ug via INTRAVENOUS
  Administered 2013-06-08: 10 ug via INTRAVENOUS
  Administered 2013-06-08: 20 ug via INTRAVENOUS
  Administered 2013-06-08: 30 ug via INTRAVENOUS
  Administered 2013-06-08: 40 ug via INTRAVENOUS
  Administered 2013-06-08: 20 ug via INTRAVENOUS
  Administered 2013-06-09: 40 ug via INTRAVENOUS
  Administered 2013-06-09: 10 ug via INTRAVENOUS
  Administered 2013-06-09: 40 ug via INTRAVENOUS
  Administered 2013-06-09 – 2013-06-10 (×4): 10 ug via INTRAVENOUS
  Administered 2013-06-10 (×2): 20 ug via INTRAVENOUS
  Filled 2013-06-07: qty 50

## 2013-06-07 MED ORDER — HYDROMORPHONE HCL PF 1 MG/ML IJ SOLN
INTRAMUSCULAR | Status: AC
  Filled 2013-06-07: qty 1

## 2013-06-07 MED ORDER — OXYCODONE HCL 5 MG PO TABS: 5.0000 mg | ORAL_TABLET | Freq: Once | ORAL | Status: DC | PRN

## 2013-06-07 MED ORDER — ONDANSETRON HCL 4 MG/2ML IJ SOLN: 4.0000 mg | Freq: Four times a day (QID) | INTRAMUSCULAR | Status: DC | PRN

## 2013-06-07 MED ORDER — MUPIROCIN 2 % EX OINT
TOPICAL_OINTMENT | Freq: Two times a day (BID) | CUTANEOUS | Status: DC
  Administered 2013-06-07 – 2013-06-10 (×6): via NASAL
  Administered 2013-06-11: 1 via NASAL
  Administered 2013-06-11: 21:00:00 via NASAL
  Filled 2013-06-07 (×3): qty 22

## 2013-06-07 MED ORDER — ROCURONIUM BROMIDE 100 MG/10ML IV SOLN
INTRAVENOUS | Status: DC | PRN
  Administered 2013-06-07: 50 mg via INTRAVENOUS

## 2013-06-07 MED ORDER — HYDROXYCHLOROQUINE SULFATE 200 MG PO TABS
200.0000 mg | ORAL_TABLET | Freq: Two times a day (BID) | ORAL | Status: DC
  Administered 2013-06-07 – 2013-06-13 (×12): 200 mg via ORAL
  Filled 2013-06-07 (×13): qty 1

## 2013-06-07 MED ORDER — PROPOFOL 10 MG/ML IV BOLUS
INTRAVENOUS | Status: DC | PRN
  Administered 2013-06-07: 90 mg via INTRAVENOUS

## 2013-06-07 MED ORDER — NEOSTIGMINE METHYLSULFATE 1 MG/ML IJ SOLN
INTRAMUSCULAR | Status: DC | PRN
  Administered 2013-06-07: 3 mg via INTRAVENOUS

## 2013-06-07 MED ORDER — PROMETHAZINE HCL 25 MG/ML IJ SOLN: 6.2500 mg | INTRAMUSCULAR | Status: DC | PRN

## 2013-06-07 MED ORDER — DOCUSATE SODIUM 100 MG PO CAPS
100.0000 mg | ORAL_CAPSULE | Freq: Two times a day (BID) | ORAL | Status: DC
  Administered 2013-06-07 – 2013-06-12 (×10): 100 mg via ORAL
  Filled 2013-06-07 (×11): qty 1

## 2013-06-07 MED ORDER — OXYCODONE HCL 5 MG/5ML PO SOLN: 5.0000 mg | Freq: Once | ORAL | Status: DC | PRN

## 2013-06-07 MED ORDER — TRAMADOL HCL 50 MG PO TABS
50.0000 mg | ORAL_TABLET | Freq: Four times a day (QID) | ORAL | Status: DC | PRN
  Administered 2013-06-10 (×2): 50 mg via ORAL
  Filled 2013-06-07 (×2): qty 1

## 2013-06-07 MED ORDER — LIDOCAINE HCL (CARDIAC) 20 MG/ML IV SOLN
INTRAVENOUS | Status: AC
  Filled 2013-06-07: qty 5

## 2013-06-07 MED ORDER — STERILE WATER FOR INJECTION IJ SOLN
INTRAMUSCULAR | Status: AC
  Filled 2013-06-07: qty 10

## 2013-06-07 MED ORDER — DIPHENHYDRAMINE HCL 50 MG/ML IJ SOLN: 12.5000 mg | Freq: Four times a day (QID) | INTRAMUSCULAR | Status: DC | PRN

## 2013-06-07 MED ORDER — OXYCODONE HCL 5 MG PO TABS
5.0000 mg | ORAL_TABLET | ORAL | Status: DC | PRN
  Administered 2013-06-10 – 2013-06-11 (×2): 10 mg via ORAL
  Administered 2013-06-12 – 2013-06-13 (×3): 5 mg via ORAL
  Filled 2013-06-07 (×2): qty 1
  Filled 2013-06-07 (×2): qty 2
  Filled 2013-06-07: qty 1

## 2013-06-07 MED ORDER — NEOSTIGMINE METHYLSULFATE 1 MG/ML IJ SOLN
INTRAMUSCULAR | Status: AC
  Filled 2013-06-07: qty 10

## 2013-06-07 MED ORDER — MIDAZOLAM HCL 5 MG/5ML IJ SOLN
INTRAMUSCULAR | Status: DC | PRN
  Administered 2013-06-07: 1 mg via INTRAVENOUS

## 2013-06-07 MED ORDER — DIPHENHYDRAMINE HCL 12.5 MG/5ML PO ELIX
12.5000 mg | ORAL_SOLUTION | Freq: Four times a day (QID) | ORAL | Status: DC | PRN
  Filled 2013-06-07: qty 5

## 2013-06-07 MED ORDER — VECURONIUM BROMIDE 10 MG IV SOLR
INTRAVENOUS | Status: DC | PRN
Start: 2013-06-07 — End: 2013-06-07
  Administered 2013-06-07 (×3): 2 mg via INTRAVENOUS

## 2013-06-07 MED ORDER — HEMOSTATIC AGENTS (NO CHARGE) OPTIME
TOPICAL | Status: DC | PRN
  Administered 2013-06-07: 1 via TOPICAL

## 2013-06-07 MED ORDER — BISACODYL 5 MG PO TBEC
10.0000 mg | DELAYED_RELEASE_TABLET | Freq: Every day | ORAL | Status: DC
  Administered 2013-06-08 – 2013-06-12 (×5): 10 mg via ORAL
  Filled 2013-06-07 (×5): qty 2

## 2013-06-07 MED ORDER — SODIUM CHLORIDE 0.9 % IV SOLN
10.0000 mg | INTRAVENOUS | Status: DC | PRN
  Administered 2013-06-07: 10 ug/min via INTRAVENOUS

## 2013-06-07 MED ORDER — POTASSIUM CHLORIDE 10 MEQ/50ML IV SOLN: 10.0000 meq | Freq: Every day | INTRAVENOUS | Status: DC | PRN

## 2013-06-07 MED ORDER — LACTATED RINGERS IV SOLN
INTRAVENOUS | Status: DC | PRN
  Administered 2013-06-07 (×2): via INTRAVENOUS

## 2013-06-07 MED ORDER — MIDAZOLAM HCL 2 MG/2ML IJ SOLN
INTRAMUSCULAR | Status: AC
  Filled 2013-06-07: qty 2

## 2013-06-07 MED ORDER — ROCURONIUM BROMIDE 50 MG/5ML IV SOLN
INTRAVENOUS | Status: AC
  Filled 2013-06-07: qty 1

## 2013-06-07 MED ORDER — GLYCOPYRROLATE 0.2 MG/ML IJ SOLN
INTRAMUSCULAR | Status: DC | PRN
  Administered 2013-06-07: 0.4 mg via INTRAVENOUS

## 2013-06-07 MED ORDER — NALOXONE HCL 0.4 MG/ML IJ SOLN: 0.4000 mg | INTRAMUSCULAR | Status: DC | PRN

## 2013-06-07 MED ORDER — SENNOSIDES-DOCUSATE SODIUM 8.6-50 MG PO TABS
1.0000 | ORAL_TABLET | Freq: Every day | ORAL | Status: DC
  Administered 2013-06-07 – 2013-06-11 (×5): 1 via ORAL
  Filled 2013-06-07 (×7): qty 1

## 2013-06-07 MED ORDER — PANTOPRAZOLE SODIUM 40 MG PO TBEC
40.0000 mg | DELAYED_RELEASE_TABLET | Freq: Every day | ORAL | Status: DC
  Administered 2013-06-08 – 2013-06-13 (×6): 40 mg via ORAL
  Filled 2013-06-07 (×6): qty 1

## 2013-06-07 MED ORDER — ARTIFICIAL TEARS OP OINT
TOPICAL_OINTMENT | OPHTHALMIC | Status: AC
  Filled 2013-06-07: qty 3.5

## 2013-06-07 MED ORDER — POLYVINYL ALCOHOL 1.4 % OP SOLN
1.0000 [drp] | Freq: Two times a day (BID) | OPHTHALMIC | Status: DC | PRN
  Filled 2013-06-07: qty 15

## 2013-06-07 MED ORDER — ALBUMIN HUMAN 5 % IV SOLN
INTRAVENOUS | Status: DC | PRN
  Administered 2013-06-07: 11:00:00 via INTRAVENOUS

## 2013-06-07 MED ORDER — FENTANYL CITRATE 0.05 MG/ML IJ SOLN
INTRAMUSCULAR | Status: DC | PRN
Start: 2013-06-07 — End: 2013-06-07
  Administered 2013-06-07: 75 ug via INTRAVENOUS
  Administered 2013-06-07 (×2): 50 ug via INTRAVENOUS
  Administered 2013-06-07: 25 ug via INTRAVENOUS
  Administered 2013-06-07: 50 ug via INTRAVENOUS

## 2013-06-07 MED ORDER — POLYETHYL GLYCOL-PROPYL GLYCOL 0.4-0.3 % OP SOLN: 1.0000 [drp] | Freq: Two times a day (BID) | OPHTHALMIC | Status: DC | PRN

## 2013-06-07 MED ORDER — ACETAMINOPHEN 160 MG/5ML PO SOLN
1000.0000 mg | Freq: Four times a day (QID) | ORAL | Status: AC
  Filled 2013-06-07: qty 40

## 2013-06-07 MED ORDER — DEXTROSE 5 % IV SOLN
1.5000 g | INTRAVENOUS | Status: DC
  Filled 2013-06-07: qty 1.5

## 2013-06-07 MED ORDER — BUPIVACAINE ON-Q PAIN PUMP (FOR ORDER SET NO CHG)
INJECTION | Status: AC
  Filled 2013-06-07: qty 1

## 2013-06-07 MED ORDER — BUPIVACAINE 0.5 % ON-Q PUMP SINGLE CATH 400 ML
400.0000 mL | INJECTION | Status: DC
  Filled 2013-06-07: qty 400

## 2013-06-07 MED ORDER — GABAPENTIN 600 MG PO TABS
600.0000 mg | ORAL_TABLET | Freq: Three times a day (TID) | ORAL | Status: DC
  Administered 2013-06-07 – 2013-06-13 (×17): 600 mg via ORAL
  Filled 2013-06-07 (×20): qty 1

## 2013-06-07 MED ORDER — VECURONIUM BROMIDE 10 MG IV SOLR
INTRAVENOUS | Status: AC
  Filled 2013-06-07: qty 10

## 2013-06-07 MED ORDER — KCL IN DEXTROSE-NACL 20-5-0.45 MEQ/L-%-% IV SOLN
INTRAVENOUS | Status: DC
  Administered 2013-06-07: 15:00:00 via INTRAVENOUS
  Filled 2013-06-07 (×3): qty 1000

## 2013-06-07 MED ORDER — SODIUM CHLORIDE 0.9 % IJ SOLN: 9.0000 mL | INTRAMUSCULAR | Status: DC | PRN

## 2013-06-07 MED ORDER — CALCIUM CARBONATE-VITAMIN D 500-200 MG-UNIT PO TABS
1.0000 | ORAL_TABLET | Freq: Every day | ORAL | Status: DC
  Administered 2013-06-08 – 2013-06-13 (×4): 1 via ORAL
  Filled 2013-06-07 (×8): qty 1

## 2013-06-07 MED ORDER — KCL IN DEXTROSE-NACL 20-5-0.45 MEQ/L-%-% IV SOLN
INTRAVENOUS | Status: AC
  Filled 2013-06-07: qty 1000

## 2013-06-07 MED ORDER — FENTANYL CITRATE 0.05 MG/ML IJ SOLN
INTRAMUSCULAR | Status: AC
  Filled 2013-06-07: qty 5

## 2013-06-07 MED ORDER — HYDROMORPHONE HCL PF 1 MG/ML IJ SOLN
0.2500 mg | INTRAMUSCULAR | Status: DC | PRN
  Administered 2013-06-07 (×2): 0.25 mg via INTRAVENOUS

## 2013-06-07 SURGICAL SUPPLY — 108 items
ADH SKN CLS APL DERMABOND .7 (GAUZE/BANDAGES/DRESSINGS) ×3
APL SRG 22X2 LUM MLBL SLNT (VASCULAR PRODUCTS)
APL SRG 7X2 LUM MLBL SLNT (VASCULAR PRODUCTS)
APPLICATOR TIP COSEAL (VASCULAR PRODUCTS) IMPLANT
APPLICATOR TIP EXT COSEAL (VASCULAR PRODUCTS) IMPLANT
BAG SPEC RTRVL 10 TROC 200 (ENDOMECHANICALS) ×3
BIT DRILL 7/64X5 DISP (BIT) ×1 IMPLANT
BLADE SURG 11 STRL SS (BLADE) ×1 IMPLANT
BRUSH CYTOL CELLEBRITY 1.5X140 (MISCELLANEOUS) IMPLANT
CANISTER SUCTION 2500CC (MISCELLANEOUS) ×4 IMPLANT
CATH KIT ON Q 5IN SLV (PAIN MANAGEMENT) ×1 IMPLANT
CATH MALECOT BARD  28FR (CATHETERS) ×1 IMPLANT
CATH THORACIC 28FR (CATHETERS) ×2 IMPLANT
CATH THORACIC 36FR (CATHETERS) IMPLANT
CATH THORACIC 36FR RT ANG (CATHETERS) IMPLANT
CLIP LIGATING EXTRA MED SLVR (CLIP) ×1 IMPLANT
CLIP TI MEDIUM 6 (CLIP) ×4 IMPLANT
CLIP TI WIDE RED SMALL 24 (CLIP) ×1 IMPLANT
CLSR STERI-STRIP ANTIMIC 1/2X4 (GAUZE/BANDAGES/DRESSINGS) ×1 IMPLANT
CONN ST 1/4X3/8  BEN (MISCELLANEOUS)
CONN ST 1/4X3/8 BEN (MISCELLANEOUS) IMPLANT
CONN Y 3/8X3/8X3/8  BEN (MISCELLANEOUS) ×2
CONN Y 3/8X3/8X3/8 BEN (MISCELLANEOUS) IMPLANT
CONT SPEC 4OZ CLIKSEAL STRL BL (MISCELLANEOUS) ×13 IMPLANT
COVER TABLE BACK 60X90 (DRAPES) ×3 IMPLANT
DERMABOND ADVANCED (GAUZE/BANDAGES/DRESSINGS) ×1
DERMABOND ADVANCED .7 DNX12 (GAUZE/BANDAGES/DRESSINGS) IMPLANT
DRAIN CHANNEL 28F RND 3/8 FF (WOUND CARE) IMPLANT
DRAIN CHANNEL 32F RND 10.7 FF (WOUND CARE) IMPLANT
DRAPE LAPAROSCOPIC ABDOMINAL (DRAPES) ×4 IMPLANT
DRAPE WARM FLUID 44X44 (DRAPE) ×4 IMPLANT
DRILL BIT 7/64X5 (BIT) ×1 IMPLANT
DRSG TEGADERM 4X4.75 (GAUZE/BANDAGES/DRESSINGS) ×1 IMPLANT
ELECT BLADE 4.0 EZ CLEAN MEGAD (MISCELLANEOUS) ×4
ELECT REM PT RETURN 9FT ADLT (ELECTROSURGICAL) ×4
ELECTRODE BLDE 4.0 EZ CLN MEGD (MISCELLANEOUS) ×3 IMPLANT
ELECTRODE REM PT RTRN 9FT ADLT (ELECTROSURGICAL) ×3 IMPLANT
FORCEPS BIOP RJ4 1.8 (CUTTING FORCEPS) IMPLANT
GEL PDS (MISCELLANEOUS) ×1 IMPLANT
GLOVE BIO SURGEON STRL SZ 6.5 (GLOVE) ×8 IMPLANT
GOWN STRL REUS W/ TWL LRG LVL3 (GOWN DISPOSABLE) ×12 IMPLANT
GOWN STRL REUS W/TWL LRG LVL3 (GOWN DISPOSABLE) ×16
HANDLE STAPLE ENDO GIA SHORT (STAPLE) ×1
KIT BASIN OR (CUSTOM PROCEDURE TRAY) ×4 IMPLANT
KIT ROOM TURNOVER OR (KITS) ×4 IMPLANT
KIT SUCTION CATH 14FR (SUCTIONS) ×4 IMPLANT
MARKER SKIN DUAL TIP RULER LAB (MISCELLANEOUS) ×4 IMPLANT
NDL BIOPSY TRANSBRONCH 21G (NEEDLE) IMPLANT
NEEDLE BIOPSY TRANSBRONCH 21G (NEEDLE) IMPLANT
NS IRRIG 1000ML POUR BTL (IV SOLUTION) ×8 IMPLANT
OIL SILICONE PENTAX (PARTS (SERVICE/REPAIRS)) ×4 IMPLANT
PACK CHEST (CUSTOM PROCEDURE TRAY) ×4 IMPLANT
PAD ARMBOARD 7.5X6 YLW CONV (MISCELLANEOUS) ×12 IMPLANT
PASSER SUT SWANSON 36MM LOOP (INSTRUMENTS) ×1 IMPLANT
POUCH ENDO CATCH II 15MM (MISCELLANEOUS) ×1 IMPLANT
POUCH RETRIEVAL ECOSAC 10 (ENDOMECHANICALS) IMPLANT
POUCH RETRIEVAL ECOSAC 10MM (ENDOMECHANICALS) ×1
RELOAD EGIA 45 MED/THCK PURPLE (STAPLE) ×3 IMPLANT
RELOAD EGIA 60 MED/THCK PURPLE (STAPLE) ×24 IMPLANT
RELOAD EGIA BLACK ROTIC 45MM (STAPLE) ×4 IMPLANT
RELOAD EGIA TRIS TAN 45 CVD (STAPLE) ×28 IMPLANT
RELOAD STAPLE 45 BLK XTHK (STAPLE) IMPLANT
RELOAD STAPLE 45 TAN MED CVD (STAPLE) IMPLANT
RELOAD STAPLE 60 MED/THCK ART (STAPLE) IMPLANT
RELOAD TRI 60 ART MED THCK PUR (STAPLE) ×3 IMPLANT
SCISSORS LAP 5X35 DISP (ENDOMECHANICALS) IMPLANT
SEALANT PROGEL (MISCELLANEOUS) IMPLANT
SEALANT SURG COSEAL 4ML (VASCULAR PRODUCTS) IMPLANT
SEALANT SURG COSEAL 8ML (VASCULAR PRODUCTS) ×1 IMPLANT
SOLUTION ANTI FOG 6CC (MISCELLANEOUS) ×4 IMPLANT
SPONGE GAUZE 4X4 12PLY (GAUZE/BANDAGES/DRESSINGS) ×4 IMPLANT
SPONGE INTESTINAL PEANUT (DISPOSABLE) ×4 IMPLANT
STAPLER ENDO GIA 12 SHRT THIN (STAPLE) IMPLANT
STAPLER ENDO GIA 12MM SHORT (STAPLE) ×3 IMPLANT
STRIP PERI DRY VERITAS 45 (STAPLE) ×1 IMPLANT
STRIP PERI DRY VERITAS 60 (STAPLE) ×6 IMPLANT
SUT PROLENE 3 0 SH DA (SUTURE) IMPLANT
SUT PROLENE 4 0 RB 1 (SUTURE)
SUT PROLENE 4-0 RB1 .5 CRCL 36 (SUTURE) IMPLANT
SUT SILK  1 MH (SUTURE) ×8
SUT SILK 1 MH (SUTURE) ×6 IMPLANT
SUT SILK 2 0 SH (SUTURE) IMPLANT
SUT SILK 2 0SH CR/8 30 (SUTURE) ×1 IMPLANT
SUT SILK 3 0SH CR/8 30 (SUTURE) IMPLANT
SUT STEEL 1 (SUTURE) IMPLANT
SUT VIC AB 1 CTX 18 (SUTURE) IMPLANT
SUT VIC AB 1 CTX 36 (SUTURE)
SUT VIC AB 1 CTX36XBRD ANBCTR (SUTURE) IMPLANT
SUT VIC AB 2-0 CT1 27 (SUTURE) ×4
SUT VIC AB 2-0 CT1 TAPERPNT 27 (SUTURE) IMPLANT
SUT VIC AB 2-0 CTX 36 (SUTURE) IMPLANT
SUT VIC AB 2-0 UR6 27 (SUTURE) IMPLANT
SUT VIC AB 3-0 SH 8-18 (SUTURE) IMPLANT
SUT VIC AB 3-0 X1 27 (SUTURE) ×2 IMPLANT
SUT VICRYL 2 TP 1 (SUTURE) ×2 IMPLANT
SWAB COLLECTION DEVICE MRSA (MISCELLANEOUS) IMPLANT
SYR 20ML ECCENTRIC (SYRINGE) ×4 IMPLANT
SYSTEM SAHARA CHEST DRAIN ATS (WOUND CARE) ×4 IMPLANT
TAPE CLOTH SURG 4X10 WHT LF (GAUZE/BANDAGES/DRESSINGS) ×1 IMPLANT
TIP APPLICATOR SPRAY EXTEND 16 (VASCULAR PRODUCTS) IMPLANT
TOWEL OR 17X24 6PK STRL BLUE (TOWEL DISPOSABLE) ×8 IMPLANT
TOWEL OR 17X26 10 PK STRL BLUE (TOWEL DISPOSABLE) ×8 IMPLANT
TRAP SPECIMEN MUCOUS 40CC (MISCELLANEOUS) ×4 IMPLANT
TRAY FOLEY CATH 16FRSI W/METER (SET/KITS/TRAYS/PACK) ×4 IMPLANT
TUBE ANAEROBIC SPECIMEN COL (MISCELLANEOUS) IMPLANT
TUBE CONNECTING 12X1/4 (SUCTIONS) ×8 IMPLANT
TUNNELER SHEATH ON-Q 11GX8 DSP (PAIN MANAGEMENT) ×1 IMPLANT
WATER STERILE IRR 1000ML POUR (IV SOLUTION) ×8 IMPLANT

## 2013-06-07 NOTE — Brief Op Note (Addendum)
      [address]       [place] [address]             [phone]      06/07/2013  12:58 PM  PATIENT:  [name]  80 y.o. female  PRE-OPERATIVE DIAGNOSIS:  RIGHT LUNG MASS  POST-OPERATIVE DIAGNOSIS:  RIGHT LUNG ADENOCARCINOMA  PROCEDURE:   VIDEO BRONCHOSCOPY RIGHT VIDEO ASSISTED THORACOSCOPY/MINI-THORACOTOMY  RIGHT UPPER LOBECTOMY  RIGHT MIDDLE LOBE WEDGE RESECTION  LYMPH NODE DISSECTION  ON-Q  SURGEON:  [name], MD  ASSISTANT: Suzzanne Cloud, PA-C  ANESTHESIA:   general  SPECIMEN:  Source of Specimen:  Right upper lobe, mediastinal lymph nodes, wedge of middle lobe   DISPOSITION OF SPECIMEN:  Pathology  DRAINS: 28 Fr CT, 28 Blake drain   PATIENT CONDITION:  PACU - hemodynamically stable.

## 2013-06-07 NOTE — Anesthesia Procedure Notes (Addendum)
Procedure Name: Intubation Date/Time: 06/07/2013 7:37 AM Performed by: [name] Pre-anesthesia Checklist: Patient identified, Emergency Drugs available, Suction available and Timeout performed Patient Re-evaluated:Patient Re-evaluated prior to inductionOxygen Delivery Method: Circle system utilized Preoxygenation: Pre-oxygenation with 100% oxygen Intubation Type: IV induction Ventilation: Mask ventilation without difficulty and Oral airway inserted - appropriate to patient size Laryngoscope Size: Mac and 4 Grade View: Grade I Endobronchial tube: Left, Double lumen EBT, EBT position confirmed by auscultation and EBT position confirmed by fiberoptic bronchoscope and 37 Fr Number of attempts: 1 Airway Equipment and Method: Stylet Placement Confirmation: ETT inserted through vocal cords under direct vision,  positive ETCO2 and breath sounds checked- equal and bilateral Tube secured with: Tape Dental Injury: Teeth and Oropharynx as per pre-operative assessment  Comments: AOI per Tawni Carnes, SRNA.

## 2013-06-07 NOTE — Progress Notes (Signed)
Patient ID: [name], female   DOB: 1930-03-15, 80 y.o.   MRN: [id number]  SICU Evening Rounds:  Hemodynamically stable. Sats 100%  Pain controlled  Urine output ok  Chest tube output ok  A/P: Stable postop. Continue present course.

## 2013-06-07 NOTE — Transfer of Care (Signed)
Immediate Anesthesia Transfer of Care Note  Patient: [name]  Procedure(s) Performed: Procedure(s): VIDEO BRONCHOSCOPY (N/A) VIDEO ASSISTED THORACOSCOPY (VATS)/right upper lobectomy, On Q (Right) MINI/LIMITED THORACOTOMY; right middle lobe wedge resection LYMPH NODE DISSECTION (Right)  Patient Location: PACU  Anesthesia Type:General  Level of Consciousness: awake, alert  and oriented  Airway & Oxygen Therapy: Patient Spontanous Breathing and Patient connected to face mask oxygen  Post-op Assessment: Report given to PACU RN, Post -op Vital signs reviewed and stable and Patient moving all extremities X 4  Post vital signs: Reviewed and stable  Complications: No apparent anesthesia complications

## 2013-06-07 NOTE — Progress Notes (Signed)
Utilization Review Completed.Neoma Laming T Dowell4/21/2015

## 2013-06-07 NOTE — Anesthesia Preprocedure Evaluation (Addendum)
Anesthesia Evaluation  Patient identified by MRN, date of birth, ID band Patient awake    Reviewed: Allergy & Precautions, H&P , NPO status , Patient's Chart, lab work & pertinent test results  History of Anesthesia Complications (+) PONV and history of anesthetic complications  Airway Mallampati: I TM Distance: >3 FB Neck ROM: Full    Dental  (+) Edentulous Upper, Edentulous Lower, Dental Advisory Given   Pulmonary    Pulmonary exam normal       Cardiovascular + Peripheral Vascular Disease  She subsequently had a nuclear stress test (signed on 12/25/08) that showed "EF 58% with fixed septal defect likely due to LBBB. No ischemia." PRN cardiology follow-up was recommended.    Echo on 12/11/08 showed: Normal LV size with mild global systolic dysfunction, EF 17-61%. Normal RV size and systolic function. Mild aortic insufficiency. Mild mitral insufficiency.    Neuro/Psych negative psych ROS   GI/Hepatic Neg liver ROS, GERD-  Medicated,  Endo/Other  negative endocrine ROS  Renal/GU negative Renal ROS     Musculoskeletal  (+) Arthritis -, Rheumatoid disorders,    Abdominal   Peds  Hematology   Anesthesia Other Findings   Reproductive/Obstetrics                         Anesthesia Physical Anesthesia Plan  ASA: III  Anesthesia Plan: General   Post-op Pain Management:    Induction: Intravenous  Airway Management Planned: Double Lumen EBT  Additional Equipment: CVP and Arterial line  Intra-op Plan:   Post-operative Plan: Possible Post-op intubation/ventilation  Informed Consent: I have reviewed the patients History and Physical, chart, labs and discussed the procedure including the risks, benefits and alternatives for the proposed anesthesia with the patient or authorized representative who has indicated his/her understanding and acceptance.   Dental advisory given  Plan Discussed with:  CRNA, Anesthesiologist and Surgeon  Anesthesia Plan Comments:        Anesthesia Quick Evaluation

## 2013-06-07 NOTE — H&P (Signed)
[address]       [place] [address]             [phone]                              [name] Medical Record #[id number] Date of Birth: 1930-11-29  Referring: [name], MD Primary Care: [name], MD  Chief Complaint:    Right Lung Cancer   History of Present Illness:    [name] 80 y.o. female has  seen in the office for  evaluation of right upper lung mass. The patient was previously followed by [name] with   [name] for a right upper lobe lung opacity. The patient was last seen in 2011 at which time a CT scan of the chest had been done. PET scan in 2010 was not hypermetabolic. As the patient was being evaluated for her rheumatologic disease a chest x-ray was performed and repeat CT scan. The patient was referred to thoracic surgery for further evaluation of the right upper lung opacity. The patient has no previous history of tuberculosis or exposure to tuberculosis, she has chronically been on Plaquenil and Mobic but denies any long-term steroid or Enbral usage. She has a history of Raynaud phenomenon and underwent an open left thoracic sympathectomy by Dr. 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 She is a nonsmoker.  The patient returns to the office after  ENB/Biopsy of right upper lobe lung lesion which revealed Lung, biopsy, Right upper lobe - ADENOCARCINOMA    Current Activity/ Functional Status:  Patient is independent with mobility/ambulation, transfers, ADL's, IADL's.   Zubrod Score: At the time of surgery this patient's most appropriate activity status/level should be described as: []     0    Normal activity, no symptoms [x]     1    Restricted in physical strenuous activity but ambulatory, able to do out light work []     2    Ambulatory and capable of self care, unable to do work activities, up and about               >50 % of waking hours                               []     3    Only limited self care, in bed greater than 50% of waking hours []     4    Completely disabled, no self care, confined to bed or chair []     5    Moribund   Past Medical History  Diagnosis Date  . Iron deficiency anemia   . Sjogren's syndrome     Dr. Abner Greenspan  . Abnormal ECG 11/2008    left bbb  . Raynaud's phenomenon 1965  . Lupus     takes Plaquenil daily  . Arthritis   . Colon polyps   . PONV (postoperative nausea and vomiting)   . Constipation     takes Colace daily  . GERD (gastroesophageal reflux disease)     takes Omeprazole daily  . Dry eyes     eye drops used   . Joint swelling   . Rheumatoid arthritis   . Back pain     scoliosis  . Osteoporosis   . Sciatica  Past Surgical History  Procedure Laterality Date  . Appendectomy    . Tonsillectomy    . Total abdominal hysterectomy  1980's   . Thoracic sympathetectomy  1965  . Cataract extraction Bilateral   . Colonoscopy    . Cardiovascular stress test      12/2008 mild fixed basal to mid septal perfusion defect felt likely due to artifact from LBBB, no ischemia, EF 58%  . Video bronchoscopy with endobronchial navigation N/A 05/04/2013    Procedure: VIDEO BRONCHOSCOPY WITH ENDOBRONCHIAL NAVIGATION;  Surgeon: [name], MD;  Location: [facility] OR;  Service: Thoracic;  Laterality: N/A;    Family History  Problem Relation Age of Onset  . Coronary artery disease Father   . Colon cancer Father   . Diabetes Father   . Esophageal cancer Neg Hx   . Kidney disease Neg Hx   . Liver disease Neg Hx     History   Social History  . Marital Status: Married    Spouse Name: N/A    Number of Children: N/A  . Years of Education: N/A   Occupational History  . Not on file.   Social History Main Topics  . Smoking status: Never Smoker   . Smokeless tobacco: Never Used  . Alcohol Use: Yes     Comment: rare/social  . Drug Use: No  . Sexual Activity: Not Currently   Other Topics Concern  . Not on  file   Social History Narrative   Lives with husband, daughter and grandchild local.    History  Smoking status  . Never Smoker   Smokeless tobacco  . Never Used    History  Alcohol Use  . Yes    Comment: rare/social     Allergies  Allergen Reactions  . Prochlorperazine Edisylate Anaphylaxis    Compazine  . Aspirin     REACTION: nose bleeds    Current Facility-Administered Medications  Medication Dose Route Frequency Provider Last Rate Last Dose  . cefUROXime (ZINACEF) 1.5 g in dextrose 5 % 50 mL IVPB  1.5 g Intravenous 60 min Pre-Op [name], MD       Facility-Administered Medications Ordered in Other Encounters  Medication Dose Route Frequency Provider Last Rate Last Dose  . fentaNYL (SUBLIMAZE) injection    Anesthesia Intra-op [name], CRNA   25 mcg at 06/07/13 0706  . lactated ringers infusion    Continuous PRN [name], CRNA      . midazolam (VERSED) 5 MG/5ML injection    Anesthesia Intra-op [name], CRNA   1 mg at 06/07/13 5643     Review of Systems:     Cardiac Review of Systems: Y or N  Chest Pain [  n  ]  Resting SOB [ n  ] Exertional SOB  [ n ]  Orthopnea [  n]   Pedal Edema [  n ]    Palpitations [n  ] Syncope  [n  ]   Presyncope [  n ]  General Review of Systems: [Y] = yes [  ]=no Constitional: recent weight change [  y];  Wt loss over the last 3 months [   ] anorexia [  ]; fatigue [name]  ]; nausea [  ]; night sweats [  ]; fever [  ]; or chills [  ];          Dental: poor dentition[dentures  ]; Last Dentist visit:   Eye : blurred vision [  ];  diplopia [   ]; vision changes [  ];  Amaurosis fugax[  ]; Resp: cough [  ];  wheezing[  ];  hemoptysis[  ]; shortness of breath[  ]; paroxysmal nocturnal dyspnea[  ]; dyspnea on exertion[  ]; or orthopnea[  ];  GI:  gallstones[  ], vomiting[  ];  dysphagia[  ]; melena[  ];  hematochezia [  ]; heartburn[y  ];   Hx of  Colonoscopy[  ]; GU: kidney stones [  ]; hematuria[  ];   dysuria [  ];   nocturia[  ];  history of     obstruction [  ]; urinary frequency [  ]             Skin: rash, swelling[  ];, hair loss[ y ];  peripheral edema[ y ];  or itching[  ]; Musculosketetal: myalgias[y  ];  joint swelling[ y ];  joint erythema[  y];  joint pain[ y ];  back pain[ y ];  Heme/Lymph: bruising[  ];  bleeding[n  ];  anemia[  ];  Neuro: TIA[  ];  headaches[  ];  stroke[  ];  vertigo[  ];  seizures[ n ];   paresthesias[  ];  difficulty walking[  ];  Psych:depression[  ]; anxiety[ y ];  Endocrine: diabetes[n  ];  thyroid dysfunction[n  ];  Immunizations: Flu up to date [name]  ]; Pneumococcal up to date [name]  ];  Other:  Physical Exam: BP 135/65  Pulse 69  Temp(Src) 98.1 F (36.7 C) (Oral)  Resp 20  SpO2 99%  PHYSICAL EXAMINATION:  General appearance: alert, cooperative, appears stated age and cachectic Neurologic: intact Heart: regular rate and rhythm, S1, S2 normal, no murmur, click, rub or gallop Lungs: clear to auscultation bilaterally Abdomen: soft, non-tender; bowel sounds normal; no masses,  no organomegaly Extremities: Patient has evidence of scoliosis, has significant deformity of her joints of her fingers hands feet,  Well-healed left thoracotomy incision She has no cervical or supraclavicular adenopathy no carotid bruits,   Diagnostic Studies & Laboratory data:     Recent Radiology Findings: Chest 2 View  06/07/2013   CLINICAL DATA:  Preop for right lung surgery.  EXAM: CHEST  2 VIEW  COMPARISON:  DG CHEST 2 VIEW dated 05/02/2013  FINDINGS: Emphysematous changes in the lungs with scattered fibrosis. Focal spiculation in the right mid lung suggesting a mass lesion. This is unchanged since previous study. Surgical clips in the left side of the neck. No focal airspace disease or consolidation in the lungs. Thoracolumbar scoliosis.  IMPRESSION: No change since previous study.   Electronically Signed   By: Lucienne Capers M.D.   On: 06/07/2013 06:27   Nm Pet Image Initial (pi)  Skull Base To Thigh  04/22/2013   CLINICAL DATA:  Subsequent treatment strategy for lung mass .  EXAM: NUCLEAR MEDICINE PET SKULL BASE TO THIGH  FASTING BLOOD GLUCOSE:  Value: 80 mg/dl  TECHNIQUE: 7.1 mCi F-18 FDG was injected intravenously. Full-ring PET imaging was performed from the skull base to thigh after the radiotracer. CT data was obtained and used for attenuation correction and anatomic localization.  COMPARISON:  DG BONE DENSITY dated 11/05/2011; NM PET IMAGE INITIAL (PI) SKULL BASE TO THIGH dated 12/26/2008  FINDINGS: NECK  No hypermetabolic lymph nodes in the neck.  CHEST  The irregular consolidative right upper lobe mass is increased in metabolic activity compared to prior with SUV max 4.5 compared to SUV max 2.7 on prior. This masses  is also increased in size measuring 4.2 x 2.0 cm compared to 3.4 by 1.7 cm on prior PET / CT 12/26/2008. No additional hypermetabolic pulmonary nodules. No hypermetabolic mediastinal lymph nodes.  ABDOMEN/PELVIS  No abnormal hypermetabolic activity within the liver, pancreas, adrenal glands, or spleen. No hypermetabolic lymph nodes in the abdomen or pelvis.  SKELETON  Mild activity associated with the spinous process of the L5 vertebral body which is felt to be post traumatic / degenerative.  IMPRESSION: 1. Moderate metabolic activity associated with right upper lobe mass which is increased intensity and size gradually over time is concerning for an indolent neoplasm such as low-grade adenocarcinoma. Recommend percutaneous biopsy. 2. No evidence of metastasis. As of 03/28/2013 [facility] utilizes time-of-flight PET imaging. This new technology can increase SUV measurements by 16- 20 % over conventional PET technology.   Electronically Signed   By: Suzy Bouchard M.D.   On: 04/22/2013 15:16    RADIOLOGY REPORT*  Clinical Data: Chest wall pain. Rule out rib abnormality. Knot  midsternum with history of Sjogren's syndrome, lupus and Raynaud's  phenomenon    CHEST - 2 VIEW  Comparison: 12/12/2008  Findings: Pulmonary hyperinflation is noted and taking this into  consideration heart and mediastinal contours are stable. There is  an irregular density identified in the right upper lobe which  appears radiographically stable since 2010 making this most likely  to represent a chronic infectious or inflammatory process. A slow  growing malignant etiology is not excluded with certainty and  improved assessment for stability could be obtained with the repeat  chest CT.  No new focal infiltrates, signs of congestive failure, pleural  fluid or significant peribronchial cuffing is identified.  Surgical clips are identified in the left paratracheal region and  bony structures appear intact. No definite rib abnormality is seen  IMPRESSION:  Unchanged irregular mass-like infiltrative process in the right  upper lobe appears radiographically stable suggesting chronic  etiology however a slow growing or indolent neoplastic process is  not excluded. Improved assessment for stability can be obtained  with repeat chest CT.  No new findings identified  Original Report Authenticated By: Ander Gaster  CT scan of chest:   CLINICAL DATA: Abnormal chest x-ray.  EXAM: CT CHEST WITH CONTRAST  TECHNIQUE: Multidetector CT imaging of the chest was performed during intravenous contrast administration.  CONTRAST: 90 mL Omnipaque 300.  COMPARISON: DG THORACIC SPINE W/ SWIMMERS 3V dated 03/22/2013; CT CHEST W/O CM dated 03/26/2009; NM PET IMAGE INITIAL (PI) SKULL BASE TO THIGH dated 12/26/2008  FINDINGS: The right upper lobe mass has increased in size although the measurements are only slightly increased, there is more soft tissue density. This study is also better resolution than on prior. Craniocaudal, the mass measures 6.2 cm, previously 4.3 cm. There is increasing spiculation around the mass and a central low-attenuation regions suggesting necrosis.  On axial imaging, the mass measures 2 cm x 3.5 cm. Cicatricial changes nearby appear more pronounced. Tortuous thoracic aorta is present. No acute vascular abnormality. No pericardial or pleural effusion. There is no axillary adenopathy. No mediastinal or hilar adenopathy. Incidental imaging of the upper abdomen is within normal limits. No adrenal masses are identified. Aortic atherosclerosis is present. Severe upper lumbar spondylosis. No compression fractures.  IMPRESSION: 1. Enlargement with increased soft tissue density of the right upper lobe mass compared to prior examinations. Previously this demonstrated low level metabolic activity on PET-CT and given the enlargement, this likely represents indolent neoplasm (primary lung  adenocarcinoma). 2. Thoracic surgery consultation is recommended for further management of the mass. Options include repeat PET-CT or tissue sampling. 3. These results were called by telephone at the time of interpretation on 03/25/2013 at 2:14 PM to Dr. [name] , who verbally acknowledged these results.   Electronically Signed By: Dereck Ligas M.D. On: 03/25/2013 14:17   Recent Lab Findings: Lab Results  Component Value Date   WBC 5.0 06/02/2013   HGB 10.8* 06/02/2013   HCT 33.1* 06/02/2013   PLT 190 06/02/2013   GLUCOSE 102* 06/02/2013   CHOL 208* 08/13/2011   TRIG 65.0 08/13/2011   HDL 82.10 08/13/2011   LDLDIRECT 100.9 08/13/2011   LDLCALC 102* 03/07/2010   ALT 20 06/02/2013   AST 32 06/02/2013   NA 135* 06/02/2013   K 4.5 06/02/2013   CL 96 06/02/2013   CREATININE 1.04 06/02/2013   BUN 24* 06/02/2013   CO2 24 06/02/2013   TSH 4.22 08/13/2011   INR 0.89 06/02/2013   PFTS: Patient had difficulty doing the PFTs, FEV1 was satisfactory 1.73 she has significant diffusion capacity deficit but with some technical problems in obtaining the study  Path: [id number] right upper lobe 1. Lung, biopsy, Right upper lobe - BENIGN LUNG TISSUE. - NO  GRANULOMAS OR MALIGNANCY. 2. Lung, biopsy, Right upper lobe - ADENOCARCINOMA. Microscopic Comment 2. Dr. Donato Heinz agrees. (JDP:caf 05/05/13) Claudette Laws MD [facility] requested   Assessment / Plan:    Chronically appearing opacity in the upper portion of the right lung since at least 2010, on measurements the mass appears to be enlarging .  ENB (electromagnetic navigational bronchoscopy) with BX  was done and confirms adenocarcinoma of the lung.  Stage IIA (cT2a,cN0,cM0) I have discussed with the patient right upper lobectomy vs radiation therapy as treatment options.  She will stay off [place].  Patient after considering radiation treatment decided to proceed with surgical resection.  The goals risks and alternatives of the planned surgical procedure bronchoscopy and right VATS with lung resection  have been discussed with the patient in detail. The risks of the procedure including death, infection, stroke, myocardial infarction, bleeding, blood transfusion have all been discussed specifically.  I have quoted [name] a 4 % of perioperative mortality and a complication rate as high as 25 %. The patient's questions have been answered.[name] is willing  to proceed with the planned procedure.    . Iron deficiency anemia   . Sjogren's syndrome     Dr. Abner Greenspan  . Abnormal ECG 11/2008  . Raynaud's phenomenon 1965  . Lupus     skin lupus  . Arthritis   . Colon polyps       [name] MD      [address] ,[place] [address] Office [phone]   Beeper [phone]

## 2013-06-08 ENCOUNTER — Inpatient Hospital Stay (HOSPITAL_COMMUNITY): Payer: Medicare PPO

## 2013-06-08 DIAGNOSIS — E43 Unspecified severe protein-calorie malnutrition: Secondary | ICD-10-CM | POA: Diagnosis present

## 2013-06-08 LAB — CBC
HCT: 28.9 % — ABNORMAL LOW (ref 36.0–46.0)
Hemoglobin: 9.6 g/dL — ABNORMAL LOW (ref 12.0–15.0)
MCH: 28.9 pg (ref 26.0–34.0)
MCHC: 33.2 g/dL (ref 30.0–36.0)
MCV: 87 fL (ref 78.0–100.0)
PLATELETS: 149 10*3/uL — AB (ref 150–400)
RBC: 3.32 MIL/uL — ABNORMAL LOW (ref 3.87–5.11)
RDW: 14.4 % (ref 11.5–15.5)
WBC: 7.6 10*3/uL (ref 4.0–10.5)

## 2013-06-08 LAB — BLOOD GAS, ARTERIAL
Acid-Base Excess: 2.2 mmol/L — ABNORMAL HIGH (ref 0.0–2.0)
Bicarbonate: 26.4 mEq/L — ABNORMAL HIGH (ref 20.0–24.0)
DRAWN BY: 331761
O2 CONTENT: 2 L/min
O2 SAT: 99.3 %
Patient temperature: 98.6
TCO2: 27.7 mmol/L (ref 0–100)
pCO2 arterial: 42.7 mmHg (ref 35.0–45.0)
pH, Arterial: 7.408 (ref 7.350–7.450)
pO2, Arterial: 153 mmHg — ABNORMAL HIGH (ref 80.0–100.0)

## 2013-06-08 LAB — BASIC METABOLIC PANEL
BUN: 13 mg/dL (ref 6–23)
CHLORIDE: 98 meq/L (ref 96–112)
CO2: 25 mEq/L (ref 19–32)
Calcium: 8.1 mg/dL — ABNORMAL LOW (ref 8.4–10.5)
Creatinine, Ser: 0.76 mg/dL (ref 0.50–1.10)
GFR, EST AFRICAN AMERICAN: 90 mL/min — AB (ref >90)
GFR, EST NON AFRICAN AMERICAN: 78 mL/min — AB (ref >90)
Glucose, Bld: 114 mg/dL — ABNORMAL HIGH (ref 70–99)
Potassium: 4.6 mEq/L (ref 3.7–5.3)
Sodium: 132 mEq/L — ABNORMAL LOW (ref 137–147)

## 2013-06-08 MED ORDER — BOOST / RESOURCE BREEZE PO LIQD: 237.0000 mL | Freq: Three times a day (TID) | ORAL | Status: DC

## 2013-06-08 MED ORDER — ENOXAPARIN SODIUM 30 MG/0.3ML ~~LOC~~ SOLN
30.0000 mg | SUBCUTANEOUS | Status: DC
  Administered 2013-06-08 – 2013-06-13 (×6): 30 mg via SUBCUTANEOUS
  Filled 2013-06-08 (×6): qty 0.3

## 2013-06-08 MED ORDER — PNEUMOCOCCAL VAC POLYVALENT 25 MCG/0.5ML IJ INJ
0.5000 mL | INJECTION | INTRAMUSCULAR | Status: AC | PRN
  Administered 2013-06-13: 0.5 mL via INTRAMUSCULAR

## 2013-06-08 MED ORDER — BOOST / RESOURCE BREEZE PO LIQD
1.0000 | Freq: Three times a day (TID) | ORAL | Status: DC
  Administered 2013-06-08 – 2013-06-09 (×2): 1 via ORAL

## 2013-06-08 NOTE — Progress Notes (Signed)
INITIAL NUTRITION ASSESSMENT  DOCUMENTATION CODES Per approved criteria  -Severe malnutrition in the context of chronic illness -Underweight   INTERVENTION: Resource Breeze po TID, each supplement provides 250 kcal and 9 grams of protein RD to follow for nutrition care plan  NUTRITION DIAGNOSIS: Increased nutrient needs related to catabolic illness, post-op healing as evidenced by estimated nutrition needs  Goal: Pt to meet >/= 90% of their estimated nutrition needs   Monitor:  PO & supplemental intake, weight, labs, I/O's  Reason for Assessment: BMI < 18.5  80 y.o. female  Admitting Dx: right lung cancer  ASSESSMENT: 80 y.o. Female with PMH of Sjogren's syndrome, iron deficiency anemia, lupus, GERD and osteoporosis; s/p video bronchoscopy with endobronchial navigation for evaluation of a RUL mass on 05/04/13 which was positive for adenocarcinoma.   Patient s/p procedures 4/21: VIDEO BRONCHOSCOPY  RIGHT VIDEO ASSISTED THORACOSCOPY/MINI-THORACOTOMY  RIGHT UPPER LOBECTOMY  RIGHT MIDDLE LOBE WEDGE RESECTION  LYMPH NODE DISSECTION   RD spoke with patient at bedside; reports her appetite is fair; did not care for her soup this AM; advanced to Full Liquids, 1027; reports she typically consumes 3 meals per day; no significant weight loss reported; + visible muscle and subcutaneous fat loss; does not like Ensure supplements, however, was amenable to trying [employer] -- RD to order.  Nutrition Focused Physical Exam:  Subcutaneous Fat:  Orbital Region: N/A Upper Arm Region: severe depletion Thoracic and Lumbar Region: N/A  Muscle:  Temple Region: N/A Clavicle Bone Region: severe depletion Clavicle and Acromion Bone Region: severe depletion Scapular Bone Region: N/A Dorsal Hand: N/A Patellar Region: N/A Anterior Thigh Region: N/A Posterior Calf Region: N/A  Edema: none  Patient meets criteria for severe malnutrition in the context of chronic illness as evidenced by  severe muscle loss and severe subcutaneous fat loss.  Height: Ht Readings from Last 1 Encounters:  06/07/13 5\' 4"  (1.626 m)    Weight: Wt Readings from Last 1 Encounters:  06/07/13 105 lb 13.1 oz (48 kg)    Ideal Body Weight: 120 lb  % Ideal Body Weight: 87%  Wt Readings from Last 10 Encounters:  06/07/13 105 lb 13.1 oz (48 kg)  06/07/13 105 lb 13.1 oz (48 kg)  06/02/13 105 lb 13.1 oz (47.999 kg)  05/06/13 106 lb (48.081 kg)  05/02/13 108 lb 7.5 oz (49.2 kg)  04/25/13 106 lb (48.081 kg)  04/04/13 106 lb (48.081 kg)  03/07/13 108 lb 6.4 oz (49.17 kg)  02/22/13 108 lb 9.6 oz (49.261 kg)  09/18/11 109 lb 3.2 oz (49.533 kg)    Usual Body Weight: 106 lb  % Usual Body Weight: 99%  BMI:  Body mass index is 18.16 kg/(m^2).  Estimated Nutritional Needs: Kcal: 1300-1500 Protein: 70-80 gm Fluid: >/ 1.5 L  Skin: chest surgical incision   Diet Order: Full Liquid  EDUCATION NEEDS: -No education needs identified at this time   Intake/Output Summary (Last 24 hours) at 06/08/13 1218 Last data filed at 06/08/13 1200  Gross per 24 hour  Intake 4426.47 ml  Output   2541 ml  Net 1885.47 ml    Labs:   Recent Labs Lab 06/02/13 1135 06/07/13 1221 06/08/13 0435  NA 135* 134* 132*  K 4.5 3.8 4.6  CL 96  --  98  CO2 24  --  25  BUN 24*  --  13  CREATININE 1.04  --  0.76  CALCIUM 9.5  --  8.1*  GLUCOSE 102*  --  114*  Scheduled Meds: . acetaminophen  1,000 mg Oral 4 times per day   Or  . acetaminophen (TYLENOL) oral liquid 160 mg/5 mL  1,000 mg Oral 4 times per day  . bisacodyl  10 mg Oral Daily  . calcium-vitamin D  1 tablet Oral Q breakfast  . docusate sodium  100 mg Oral BID  . enoxaparin (LOVENOX) injection  30 mg Subcutaneous Q24H  . fentaNYL   Intravenous 6 times per day  . gabapentin  600 mg Oral TID  . hydroxychloroquine  200 mg Oral BID  . mupirocin ointment   Nasal BID  . pantoprazole  40 mg Oral Daily  . senna-docusate  1 tablet Oral QHS     Continuous Infusions: . bupivacaine ON-Q pain pump    . dextrose 5 % and 0.45 % NaCl with KCl 20 mEq/L 50 mL/hr at 06/08/13 1100    Past Medical History  Diagnosis Date  . Iron deficiency anemia   . Sjogren's syndrome     Dr. Abner Greenspan  . Abnormal ECG 11/2008    left bbb  . Raynaud's phenomenon 1965  . Lupus     takes Plaquenil daily  . Arthritis   . Colon polyps   . PONV (postoperative nausea and vomiting)   . Constipation     takes Colace daily  . GERD (gastroesophageal reflux disease)     takes Omeprazole daily  . Dry eyes     eye drops used   . Joint swelling   . Rheumatoid arthritis   . Back pain     scoliosis  . Osteoporosis   . Sciatica     Past Surgical History  Procedure Laterality Date  . Appendectomy    . Tonsillectomy    . Total abdominal hysterectomy  1980's   . Thoracic sympathetectomy  1965  . Cataract extraction Bilateral   . Colonoscopy    . Cardiovascular stress test      12/2008 mild fixed basal to mid septal perfusion defect felt likely due to artifact from LBBB, no ischemia, EF 58%  . Video bronchoscopy with endobronchial navigation N/A 05/04/2013    Procedure: VIDEO BRONCHOSCOPY WITH ENDOBRONCHIAL NAVIGATION;  Surgeon: [name], MD;  Location: [facility];  Service: Thoracic;  Laterality: N/A;    Arthur Holms, RD, LDN Pager #: [phone] After-Hours Pager #: [phone]

## 2013-06-08 NOTE — Op Note (Signed)
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  SURGICAL PROCEDURES:  Video bronchoscopy, right video-assisted thoracoscopy, mini-thoracotomy, right upper lobectomy with lymph node dissection, wedge resection of right middle lobe, placement of On-Q.  SURGEON:  Lanelle Bal, MD.  FIRST ASSISTANT:  Suzzanne Cloud, P.A.  BRIEF HISTORY:  The patient is an 80 year old female who for several years has had evidence of a right upper lobe lung mass that has progressively enlarged in size.  Initially, this was followed by Pulmonology, however recent CT scan showed the right upper lobe mass had enlarged in size and was mildly hypermetabolic.  An ENB was performed and confirmed adenocarcinoma of the lung.  After consultation with radiation, the patient was offered lobectomy.  Risks and options were discussed with her in detail, and she was signed informed consent.  DESCRIPTION OF PROCEDURE:  With central line and arterial line in place, the patient underwent general endotracheal anesthesia without incident. Through the endotracheal tube, fiberoptic bronchoscope was passed after both the right and left tracheobronchial tree with good position of the tube and without evidence of endobronchial lesions.  Previously, the patient had undergone a bronchoscopy.  In addition, the scope was removed.  She was turned in lateral decubitus position with the right side up.  The right chest was prepped with Betadine and draped in sterile manner.  Preoperative markings were visible and a second  time- out was performed.  The right lung was collapsed.  In approximately the fourth intercostal space, a small port site was created and 30-degree scope introduced into the chest.  There was evidence of scarring on the surface of the right upper lobe.  There were no pleural metastases noted.  The major fissure was almost complete.  The minor fissure was very incomplete.  Two additional port sites were made, and through the small first incision and two additional port sites, we proceeded with right upper lobectomy.  The right superior pulmonary vein was dissected out and encircled with a vascular stapler and divided.  We proceeded with division of pulmonary artery branches.  Individually, four separate branches to the upper lobe were divided.  We then identified the bronchus.  Initially, the superior segment bronchus was identified and divided.  After dividing this, it was apparent that the main right upper lobe branch was identified and divided with black stapler.  Before completely dividing the upper lobe bronchus, the lung was inflated.  The middle and lower lobes inflated well.  We then proceeded with completing the fissure between the upper lobe and superior segment of the lower lobe with purple staplers.  With more difficulty, we divided the lung along with purple staplers with strips going anteriorly and completing the resection.  The specimen was submitted to Pathology.  The bronchial and  vascular margins were free.  The anterior margin along the middle lobe had microscopic tumor.  We then went back and did an additional wedge resection of the superior portion of the middle lobe along what had been the fissure between the middle lobe and upper lobe.  Lymph node dissection including XIR, XIIR, XR, IVR lymph nodes were then dissected out of the mediastinum cysts and submitted separately to Pathology.  An On-Q device was tunneled subpleurally along the posterior ribs and brought out  through a separate site.  Two chest tubes were left in place through the port sites.  A 28 Blake drain posteriorly and a 28 chest tube anteriorly.  The lung was inflated and tested for air leaks. CoSeal was placed on the cut edges of the fissures.  The larger of the incision that the specimen had been brought out through was then closed with pericostal sutures with the lower ribs through a drilled hole, interrupted 0 Vicryl in the muscle layer, running 2-0 Vicryl in the subcutaneous layer, and a 3-0 subcuticular stitch on the skin edges. Estimated blood loss was approximately 300 mL.  The patient started out with underlying anemia and so was transfused 1 unit of packed cells. Sponge and needle count was reported as correct at the completion of procedure.  The patient tolerated the procedure without obvious complication.  The second wedge resection along the middle lobe, frozen section on this was reported back as negative.     Lanelle Bal, MD     EG/MEDQ  D:  06/08/2013  T:  06/08/2013  Job:  [id number]

## 2013-06-08 NOTE — Care Management Note (Signed)
    Page 1 of 1   06/10/2013     2:44:58 PM CARE MANAGEMENT NOTE 06/10/2013  Patient:  [name], [name]   Account Number:  [id number]  Date Initiated:  06/08/2013  Documentation initiated by:  [facility]  Subjective/Objective Assessment:   Post op VATS     DC Planning Services  CM consult      Status of service:  In process, will continue to follow  Per UR Regulation:  Reviewed for med. necessity/level of care/duration of stay  Comments:  Contact:  Aleen Campi Daughter [phone]  06/10/13 [place] RN MSN BSN CCM Pt states she feels comfortable with support she will have @ home when discharged. Discussed home health nursing services and pt said she wanted to wait until Monday to talk to Dr Servando Snare and then would decide if she wanted home health RN visits.  Spouse had services previously from [facility] and pt was pleased with services provided.  Pt is ambulating well with staff.  06-08-13 2pm Luz Lex, RNBSN [phone] Patient sitting up in bed - eating, daughter, [name] at bedside.   patient lives at home with her husband who she usually care for.  Husband is now in respite care.  Plan is for patient to go home on discharge, daughter will be with her as much as needed and will continue with agency assistance that she has had with her husband.  They are there M-Th for 5 hours/ day.  Hoping to be able to get them on Friday also.  Discussed need for rehab - or [facility] assistance.  Feel may need HH on discharge - CM will continue to follow.

## 2013-06-08 NOTE — Progress Notes (Signed)
Patient ID: [name], female   DOB: 02-27-1930, 80 y.o.   MRN: [id number] EVENING ROUNDS NOTE :     [address]       [place] [address]             [phone]                 1 Day Post-Op Procedure(s) (LRB): VIDEO BRONCHOSCOPY (N/A) VIDEO ASSISTED THORACOSCOPY (VATS)/right upper lobectomy, On Q (Right) MINI/LIMITED THORACOTOMY; right middle lobe wedge resection LYMPH NODE DISSECTION (Right)  Total Length of Stay:  LOS: 1 day  BP 135/68  Pulse 78  Temp(Src) 98.1 F (36.7 C) (Oral)  Resp 22  Ht 5\' 4"  (1.626 m)  Wt 105 lb 13.1 oz (48 kg)  BMI 18.16 kg/m2  SpO2 99%  .Intake/Output     04/22 0701 - 04/23 0700   P.O. 960   I.V. (mL/kg) 707 (14.7)   Blood    IV Piggyback    Total Intake(mL/kg) 1667 (34.7)   Urine (mL/kg/hr) 800 (1.3)   Blood    Chest Tube 190 (0.3)   Total Output 990   Net +677         . bupivacaine ON-Q pain pump    . dextrose 5 % and 0.45 % NaCl with KCl 20 mEq/L 50 mL/hr at 06/08/13 1100     Lab Results  Component Value Date   WBC 7.6 06/08/2013   HGB 9.6* 06/08/2013   HCT 28.9* 06/08/2013   PLT 149* 06/08/2013   GLUCOSE 114* 06/08/2013   CHOL 208* 08/13/2011   TRIG 65.0 08/13/2011   HDL 82.10 08/13/2011   LDLDIRECT 100.9 08/13/2011   LDLCALC 102* 03/07/2010   ALT 20 06/02/2013   AST 32 06/02/2013   NA 132* 06/08/2013   K 4.6 06/08/2013   CL 98 06/08/2013   CREATININE 0.76 06/08/2013   BUN 13 06/08/2013   CO2 25 06/08/2013   TSH 4.22 08/13/2011   INR 0.89 06/02/2013   Stable day  [name] MD  Beeper [phone] Office [phone] 06/08/2013 8:04 PM

## 2013-06-08 NOTE — Anesthesia Postprocedure Evaluation (Signed)
Anesthesia Post Note  Patient: [name]  Procedure(s) Performed: Procedure(s) (LRB): VIDEO BRONCHOSCOPY (N/A) VIDEO ASSISTED THORACOSCOPY (VATS)/right upper lobectomy, On Q (Right) MINI/LIMITED THORACOTOMY; right middle lobe wedge resection LYMPH NODE DISSECTION (Right)  Anesthesia type: general  Patient location: PACU  Post pain: Pain level controlled  Post assessment: Patient's Cardiovascular Status Stable  Post vital signs: Reviewed and stable  Level of consciousness: sedated  Complications: No apparent anesthesia complications

## 2013-06-08 NOTE — Clinical Documentation Improvement (Signed)
Possible Clinical Conditions? Severe Malnutrition   Severe Protein Calorie Malnutrition Other Condition Supporting Information: DOCUMENTATION CODES  Per approved criteria   -Severe malnutrition in the context of chronic illness  -Underweight   INTERVENTION:  Resource Breeze po TID, each supplement provides 250 kcal and 9 grams of protein  RD to follow for nutrition care plan NUTRITION DIAGNOSIS:  Increased nutrient needs related to catabolic illness, post-op healing as evidenced by estimated nutrition needs  Goal:  Pt to meet >/= 90% of their estimated nutrition needs  Monitor:  PO & supplemental intake, weight, labs, I/O's  Reason for Assessment: BMI < 18.5  80 y.o. female  Admitting Dx: right lung cancer  ASSESSMENT:  80 y.o. Female with PMH of Sjogren's syndrome, iron deficiency anemia, lupus, GERD and osteoporosis; s/p video bronchoscopy with endobronchial navigation for evaluation of a RUL mass on 05/04/13 which was positive for adenocarcinoma.   Patient s/p procedures 4/21:  VIDEO BRONCHOSCOPY  RIGHT VIDEO ASSISTED THORACOSCOPY/MINI-THORACOTOMY  RIGHT UPPER LOBECTOMY  RIGHT MIDDLE LOBE WEDGE RESECTION  LYMPH NODE DISSECTION  RD spoke with patient at bedside; reports her appetite is fair; did not care for her soup this AM; advanced to Full Liquids, 1027; reports she typically consumes 3 meals per day; no significant weight loss reported; + visible muscle and subcutaneous fat loss; does not like Ensure supplements, however, was amenable to trying [employer] -- RD to order.  Nutrition Focused Physical Exam:  Subcutaneous Fat:  Orbital Region: N/A  Upper Arm Region: severe depletion  Thoracic and Lumbar Region: N/A  Muscle:  Temple Region: N/A  Clavicle Bone Region: severe depletion  Clavicle and Acromion Bone Region: severe depletion  Scapular Bone Region: N/A  Dorsal Hand: N/A  Patellar Region: N/A  Anterior Thigh Region: N/A  Posterior Calf Region: N/A  Edema:  none  Patient meets criteria for severe malnutrition in the context of chronic illness as evidenced by severe muscle loss and severe subcutaneous fat loss. Thank You, Joya Salm ,RN Clinical Documentation Specialist:  [phone] [place] [employer]

## 2013-06-08 NOTE — OR Nursing (Signed)
Late entry for final re-entry count.

## 2013-06-08 NOTE — Progress Notes (Addendum)
      [address]       [place] [address]             [phone]      1 Day Post-Op Procedure(s) (LRB): VIDEO BRONCHOSCOPY (N/A) VIDEO ASSISTED THORACOSCOPY (VATS)/right upper lobectomy, On Q (Right) MINI/LIMITED THORACOTOMY; right middle lobe wedge resection LYMPH NODE DISSECTION (Right)  Subjective:  Ms. Vanstone states she is doing okay.  She states she "isnt ready to run any races"  Pain is controlled with use of PCA  Objective: Vital signs in last 24 hours: Temp:  [97.9 F (36.6 C)-99 F (37.2 C)] 98.4 F (36.9 C) (04/22 0809) Pulse Rate:  [64-76] 73 (04/22 0800) Cardiac Rhythm:  [-] Normal sinus rhythm (04/22 0800) Resp:  [10-21] 14 (04/22 0730) BP: (97-137)/(45-74) 118/62 mmHg (04/22 0800) SpO2:  [96 %-100 %] 100 % (04/22 0800) Arterial Line BP: (81-165)/(39-78) 106/44 mmHg (04/22 0800) Weight:  [105 lb 13.1 oz (48 kg)] 105 lb 13.1 oz (48 kg) (04/21 1307)  Intake/Output from previous day: 04/21 0701 - 04/22 0700 In: 3961.5 [I.V.:3326.5; Blood:335; IV Piggyback:300] Out: 2976 [Urine:2190; Blood:400; Chest Tube:386] Intake/Output this shift: Total I/O In: 100 [I.V.:100] Out: 95 [Urine:75; Chest Tube:20]  General appearance: alert, cooperative and no distress Heart: regular rate and rhythm Lungs: clear to auscultation bilaterally Abdomen: soft, non-tender; bowel sounds normal; no masses,  no organomegaly Wound: clean and dry  Lab Results:  Recent Labs  06/07/13 1221 06/08/13 0435  WBC  --  7.6  HGB 8.2* 9.6*  HCT 24.0* 28.9*  PLT  --  149*   BMET:  Recent Labs  06/07/13 1221 06/08/13 0435  NA 134* 132*  K 3.8 4.6  CL  --  98  CO2  --  25  GLUCOSE  --  114*  BUN  --  13  CREATININE  --  0.76  CALCIUM  --  8.1*    PT/INR: No results found for this basename: LABPROT, INR,  in the last 72 hours ABG    Component Value Date/Time   PHART 7.408 06/08/2013 0448   HCO3 26.4* 06/08/2013 0448   TCO2 27.7 06/08/2013 0448   O2SAT  99.3 06/08/2013 0448   CBG (last 3)  No results found for this basename: GLUCAP,  in the last 72 hours  Assessment/Plan: S/P Procedure(s) (LRB): VIDEO BRONCHOSCOPY (N/A) VIDEO ASSISTED THORACOSCOPY (VATS)/right upper lobectomy, On Q (Right) MINI/LIMITED THORACOTOMY; right middle lobe wedge resection LYMPH NODE DISSECTION (Right)  1. Chest tube- 386 cc output since surgery, + air leak with cough, will leave chest tubes to suction 2. Pulm- not on oxygen, continue IS 3. D/C Arterial Line 4. Decrease IV Fluids 5. Leave foley in place today 6. Dispo- patient stable, will leave in ICU today, continue chest tubes to suction   LOS: 1 day    Ell[place] Handler 06/08/2013  Expected Acute  Blood - loss Anemia Small air leak with cough I have seen and examined [name] and agree with the above assessment  and plan.  [name] MD Beeper [phone] Office [phone] 06/08/2013 8:41 AM

## 2013-06-08 NOTE — Progress Notes (Signed)
Patient ID: [name], female   DOB: [birth year], 80 y.o.   MRN: [id number] EVENING ROUNDS NOTE :     [address]       [place] [address]             [phone]                 1 Day Post-Op Procedure(s) (LRB): VIDEO BRONCHOSCOPY (N/A) VIDEO ASSISTED THORACOSCOPY (VATS)/right upper lobectomy, On Q (Right) MINI/LIMITED THORACOTOMY; right middle lobe wedge resection LYMPH NODE DISSECTION (Right)  Total Length of Stay:  LOS: 1 day  BP 135/68  Pulse 78  Temp(Src) 98.1 F (36.7 C) (Oral)  Resp 22  Ht 5\' 4"  (1.626 m)  Wt 105 lb 13.1 oz (48 kg)  BMI 18.16 kg/m2  SpO2 99%  .Intake/Output     04/22 0701 - 04/23 0700   P.O. 960   I.V. (mL/kg) 707 (14.7)   Blood    IV Piggyback    Total Intake(mL/kg) 1667 (34.7)   Urine (mL/kg/hr) 800 (1.3)   Blood    Chest Tube 190 (0.3)   Total Output 990   Net +677         . bupivacaine ON-Q pain pump    . dextrose 5 % and 0.45 % NaCl with KCl 20 mEq/L 50 mL/hr at 06/08/13 1100     Lab Results  Component Value Date   WBC 7.6 06/08/2013   HGB 9.6* 06/08/2013   HCT 28.9* 06/08/2013   PLT 149* 06/08/2013   GLUCOSE 114* 06/08/2013   CHOL 208* 08/13/2011   TRIG 65.0 08/13/2011   HDL 82.10 08/13/2011   LDLDIRECT 100.9 08/13/2011   LDLCALC 102* 03/07/2010   ALT 20 06/02/2013   AST 32 06/02/2013   NA 132* 06/08/2013   K 4.6 06/08/2013   CL 98 06/08/2013   CREATININE 0.76 06/08/2013   BUN 13 06/08/2013   CO2 25 06/08/2013   TSH 4.22 08/13/2011   INR 0.89 06/02/2013   Good respiratory effort Severe malnutrition in the context of chronic illness raynauds and Sjogren syndrome  -Underweight      [name] MD  Beeper [phone] Office [phone] 06/08/2013 7:57 PM

## 2013-06-09 ENCOUNTER — Inpatient Hospital Stay (HOSPITAL_COMMUNITY): Payer: Medicare PPO

## 2013-06-09 LAB — COMPREHENSIVE METABOLIC PANEL
ALT: 13 U/L (ref 0–35)
AST: 26 U/L (ref 0–37)
Albumin: 2.7 g/dL — ABNORMAL LOW (ref 3.5–5.2)
Alkaline Phosphatase: 45 U/L (ref 39–117)
BUN: 10 mg/dL (ref 6–23)
CO2: 28 mEq/L (ref 19–32)
Calcium: 8.7 mg/dL (ref 8.4–10.5)
Chloride: 102 mEq/L (ref 96–112)
Creatinine, Ser: 0.69 mg/dL (ref 0.50–1.10)
GFR calc non Af Amer: 80 mL/min — ABNORMAL LOW (ref >90)
GLUCOSE: 92 mg/dL (ref 70–99)
Potassium: 4.6 mEq/L (ref 3.7–5.3)
SODIUM: 137 meq/L (ref 137–147)
TOTAL PROTEIN: 5.7 g/dL — AB (ref 6.0–8.3)
Total Bilirubin: 0.3 mg/dL (ref 0.3–1.2)

## 2013-06-09 LAB — CBC
HCT: 29.4 % — ABNORMAL LOW (ref 36.0–46.0)
HEMOGLOBIN: 9.7 g/dL — AB (ref 12.0–15.0)
MCH: 29.1 pg (ref 26.0–34.0)
MCHC: 33 g/dL (ref 30.0–36.0)
MCV: 88.3 fL (ref 78.0–100.0)
Platelets: 153 10*3/uL (ref 150–400)
RBC: 3.33 MIL/uL — ABNORMAL LOW (ref 3.87–5.11)
RDW: 14.2 % (ref 11.5–15.5)
WBC: 7.1 10*3/uL (ref 4.0–10.5)

## 2013-06-09 NOTE — Progress Notes (Addendum)
      [address]       [place] [address]             [phone]      2 Days Post-Op Procedure(s) (LRB): VIDEO BRONCHOSCOPY (N/A) VIDEO ASSISTED THORACOSCOPY (VATS)/right upper lobectomy, On Q (Right) MINI/LIMITED THORACOTOMY; right middle lobe wedge resection LYMPH NODE DISSECTION (Right)  Subjective:  [name] has no complaints this morning.  She is doing great and has ambulated 3 laps this morning.  No BM  Objective: Vital signs in last 24 hours: Temp:  [97.6 F (36.4 C)-98.7 F (37.1 C)] 97.9 F (36.6 C) (04/23 0748) Pulse Rate:  [63-84] 71 (04/23 0800) Cardiac Rhythm:  [-] Normal sinus rhythm (04/23 0800) Resp:  [12-22] 18 (04/23 0800) BP: (101-145)/(46-105) 135/72 mmHg (04/23 0800) SpO2:  [96 %-100 %] 100 % (04/23 0800) Weight:  [106 lb 7.7 oz (48.3 kg)] 106 lb 7.7 oz (48.3 kg) (04/23 0500)  Intake/Output from previous day: 04/22 0701 - 04/23 0700 In: 2593 [P.O.:1280; I.V.:1313] Out: 3460 [Urine:3110; Chest Tube:350] Intake/Output this shift: Total I/O In: 104 [P.O.:50; I.V.:54] Out: 70 [Urine:50; Chest Tube:20]  General appearance: alert, cooperative and no distress Heart: regular rate and rhythm Lungs: clear to auscultation bilaterally Abdomen: soft, non-tender; bowel sounds normal; no masses,  no organomegaly Wound: clean and dry   Lab Results:  Recent Labs  06/08/13 0435 06/09/13 0359  WBC 7.6 7.1  HGB 9.6* 9.7*  HCT 28.9* 29.4*  PLT 149* 153   BMET:  Recent Labs  06/08/13 0435 06/09/13 0359  NA 132* 137  K 4.6 4.6  CL 98 102  CO2 25 28  GLUCOSE 114* 92  BUN 13 10  CREATININE 0.76 0.69  CALCIUM 8.1* 8.7    PT/INR: No results found for this basename: LABPROT, INR,  in the last 72 hours ABG    Component Value Date/Time   PHART 7.408 06/08/2013 0448   HCO3 26.4* 06/08/2013 0448   TCO2 27.7 06/08/2013 0448   O2SAT 99.3 06/08/2013 0448   CBG (last 3)  No results found for this basename: GLUCAP,  in the last 72  hours  Assessment/Plan: S/P Procedure(s) (LRB): VIDEO BRONCHOSCOPY (N/A) VIDEO ASSISTED THORACOSCOPY (VATS)/right upper lobectomy, On Q (Right) MINI/LIMITED THORACOTOMY; right middle lobe wedge resection LYMPH NODE DISSECTION (Right)  1. Chest tube- 350 cc output yesterday, + air leak today with cough- leave chest tube in place on suction 2. Pulm- good use of IS 3. Remove Foley 4. Decrease IV fluids to KVO 5. Dispo- patient looks great, she is ambulating without difficulty, will transfer to step down   LOS: 2 days    Ellwood Handler 06/09/2013  Path still pending For transfer to 3300 I have seen and examined [name] and agree with the above assessment  and plan.  [name] MD Beeper [phone] Office [phone] 06/09/2013 9:04 AM

## 2013-06-09 NOTE — Progress Notes (Signed)
CT surgery p.m. Rounds  Resting comfortably with good O2 saturation Minimal chest tube drainage, no airleak Waiting for transfer to step down

## 2013-06-10 ENCOUNTER — Inpatient Hospital Stay (HOSPITAL_COMMUNITY): Payer: Medicare PPO

## 2013-06-10 LAB — CBC
HCT: 30.1 % — ABNORMAL LOW (ref 36.0–46.0)
Hemoglobin: 9.9 g/dL — ABNORMAL LOW (ref 12.0–15.0)
MCH: 28.9 pg (ref 26.0–34.0)
MCHC: 32.9 g/dL (ref 30.0–36.0)
MCV: 88 fL (ref 78.0–100.0)
Platelets: 168 10*3/uL (ref 150–400)
RBC: 3.42 MIL/uL — ABNORMAL LOW (ref 3.87–5.11)
RDW: 13.9 % (ref 11.5–15.5)
WBC: 9.2 10*3/uL (ref 4.0–10.5)

## 2013-06-10 LAB — BASIC METABOLIC PANEL
BUN: 11 mg/dL (ref 6–23)
CO2: 27 mEq/L (ref 19–32)
Calcium: 8.9 mg/dL (ref 8.4–10.5)
Chloride: 97 mEq/L (ref 96–112)
Creatinine, Ser: 0.64 mg/dL (ref 0.50–1.10)
GFR calc Af Amer: >90 mL/min (ref >90)
GFR calc non Af Amer: 82 mL/min — ABNORMAL LOW (ref >90)
Glucose, Bld: 99 mg/dL (ref 70–99)
Potassium: 4.3 mEq/L (ref 3.7–5.3)
Sodium: 134 mEq/L — ABNORMAL LOW (ref 137–147)

## 2013-06-10 MED ORDER — SODIUM CHLORIDE 0.9 % IJ SOLN: 10.0000 mL | INTRAMUSCULAR | Status: DC | PRN

## 2013-06-10 MED ORDER — SODIUM CHLORIDE 0.9 % IJ SOLN
10.0000 mL | Freq: Two times a day (BID) | INTRAMUSCULAR | Status: DC
  Administered 2013-06-10 – 2013-06-11 (×2): 20 mL via INTRAVENOUS
  Administered 2013-06-11 – 2013-06-12 (×2): 10 mL via INTRAVENOUS
  Administered 2013-06-12: 20 mL via INTRAVENOUS

## 2013-06-10 NOTE — Discharge Summary (Signed)
Physician Discharge Summary  Patient ID: [name] MRN: [id number] DOB/AGE: 78/24/1932 78 y.o.  Admit date: 06/07/2013 Discharge date: 06/12/2013  Admission Diagnoses:  Patient Active Problem List   Diagnosis Date Noted  . Protein-calorie malnutrition, severe 06/08/2013  . Severe protein-calorie malnutrition 06/08/2013  . Lung cancer 06/07/2013  . Lung cancer, Right upper lobe 05/08/2013  . Anterior chest wall pain 09/18/2011  . Breast lump 09/18/2011  . Sciatica of right side 08/13/2011  . Sinusitis 06/13/2011  . Cerumen impaction 08/20/2010  . SINUSITIS - ACUTE-NOS 04/30/2010  . OSTEOPENIA 03/07/2010  . PARESTHESIA 03/07/2010  . CONTUSION OF LOWER LEG 11/29/2009  . DIZZINESS 10/01/2009  . BACK PAIN, LEFT 03/16/2009  . Nonspecific (abnormal) findings on radiological and other examination of body structure 12/18/2008  . CT, CHEST, ABNORMAL 12/18/2008  . ABNORMAL ECHOCARDIOGRAM 12/14/2008  . URI 11/29/2008  . [place] SYNDROME 11/29/2008  . CONTACT DERMATITIS 11/02/2006  . HYPOGLYCEMIA 06/29/2006  . ANEMIA-IRON DEFICIENCY 06/29/2006  . RAYNAUD'S DISEASE 06/29/2006   Discharge Diagnoses:   Patient Active Problem List   Diagnosis Date Noted  . Protein-calorie malnutrition, severe 06/08/2013  . Severe protein-calorie malnutrition 06/08/2013  . Lung cancer 06/07/2013  . Lung cancer, Right upper lobe 05/08/2013  . Anterior chest wall pain 09/18/2011  . Breast lump 09/18/2011  . Sciatica of right side 08/13/2011  . Sinusitis 06/13/2011  . Cerumen impaction 08/20/2010  . SINUSITIS - ACUTE-NOS 04/30/2010  . OSTEOPENIA 03/07/2010  . PARESTHESIA 03/07/2010  . CONTUSION OF LOWER LEG 11/29/2009  . DIZZINESS 10/01/2009  . BACK PAIN, LEFT 03/16/2009  . Nonspecific (abnormal) findings on radiological and other examination of body structure 12/18/2008  . CT, CHEST, ABNORMAL 12/18/2008  . ABNORMAL ECHOCARDIOGRAM 12/14/2008  . URI 11/29/2008  . [place] SYNDROME  11/29/2008  . CONTACT DERMATITIS 11/02/2006  . HYPOGLYCEMIA 06/29/2006  . ANEMIA-IRON DEFICIENCY 06/29/2006  . RAYNAUD'S DISEASE 06/29/2006   Discharged Condition: good  History of Present Illness:   [name] is an 80 yo female referred to [facility] for Right Upper Lobe Mass.  The patient had initially been followed by [name].  She was last seen by in 2011 at which time a CT scan was obtained and showed the right upper lobe mass to be stable.  The patient was undergoing workup for Rheumatologic disease which consisted of CXR and CT scan.  These revealed the right upper lobe mass had increased in size in comparison to the previous CT scan.  She was initially evaluated by Dr. Servando Snare on 04/04/13 at which time he felt she should undergo PET CT scan and then likely proceed with fine needle biopsy.  PET CT was done and showed moderate metabolic activity in the right upper lobe mass.  After review of PET CT scan it was felt she should undergo ENB with Biopsy.  This was done on 05/04/2013 and showed a diagnosis of Adenocarcinoma.  Due this finding it was felt she would benefit from surgical resection.  The risks and benefits of the procedure were explained to the patient and she was agreeable to proceed.  Hospital Course:   The patient presented to [facility] on [facility]07[facility].  She was taken to the operating room and underwent Video Bronchoscopy, Right Video Assisted Mini Thoracotomy, Right Upper Lobectomy, Wedge Resection Right Middle Lobe, and Lymph Node Sampling.  She tolerated the procedure well, was extubated and taken to the PACU in stable condition.  The patient has done well since surgery.  Her chest tube initially  exhibited and air leak which has since resolved.  Her chest tubes have been converted to water seal.  She had a small, intermittent air leak on 06/11/2013. Chest x ray remained stable.Her posterior chest tube was removed. There is a small amount of tidling with cough this am in the  remaining chest tube. The chest tube was removed later that afternoon. She is constipated and has been given a laxative. Follow up CXR shows no change in her right apical pneumothorax.  She is tolerating a regular diet.  She is ambulating independently.  She is deemed medically stable for discharge on 06/13/13.  Her final pathology has yet to be reported.  Treatments: surgery:   Video bronchoscopy, right video-assisted thoracoscopy, mini-thoracotomy, right upper lobectomy with lymph node  dissection, wedge resection of right middle lobe, placement of On-Q  Disposition: 01-Home or Self Care  Discharge Medications:    Medication List         acetaminophen 500 MG tablet  Commonly known as:  TYLENOL  Take 1,000 mg by mouth every 6 (six) hours as needed for mild pain.     Biotin 1000 MCG tablet  Take 1,000 mcg by mouth 2 (two) times daily.     CALCIUM 500 + D 500-125 MG-UNIT Tabs  Generic drug:  Calcium Carbonate-Vitamin D  Take 1 tablet by mouth daily.     estradiol 0.1 MG/GM vaginal cream  Commonly known as:  ESTRACE  Place 1 Applicatorful vaginally 2 (two) times a week. On Wednesday and Sunday.     gabapentin 600 MG tablet  Commonly known as:  NEURONTIN  Take 600 mg by mouth 3 (three) times daily.     GLUCOSAMINE CHONDR 1500 COMPLX PO  Take 1 capsule by mouth daily.     hydroxychloroquine 200 MG tablet  Commonly known as:  PLAQUENIL  Take 200 mg by mouth 2 (two) times daily.     meloxicam 15 MG tablet  Commonly known as:  MOBIC  Take 15 mg by mouth daily.     multivitamin tablet  Take 1 tablet by mouth daily.     omeprazole 20 MG capsule  Commonly known as:  PRILOSEC  Take 1 capsule (20 mg total) by mouth 2 (two) times daily before a meal.     oxyCODONE 5 MG immediate release tablet  Commonly known as:  Oxy IR/ROXICODONE  Take 1-2 tablets (5-10 mg total) by mouth every 4 (four) hours as needed for severe pain.     STOOL SOFTENER 100 MG capsule  Generic drug:   docusate sodium  Take 100 mg by mouth 2 (two) times daily.     SYSTANE OP  Place 1 drop into both eyes 2 (two) times daily as needed (dry eyes).              Future Appointments Provider Department Dept Phone   06/27/2013 1:30 PM [facility] [place] [phone]     Follow-up Information   Follow up with [facility] On 06/27/2013. (Appointment is at 1:30)    Contact information:   [address] [place] [address]       Follow up with [facility] On 06/27/2013. (Please get CXR at 12:30)    Contact information:          Signed: Nani Skillern 06/12/2013, 9:55 AM

## 2013-06-10 NOTE — Progress Notes (Signed)
Pt received from [facility] via wheelchair. Pt stable w/o c/o of pain or discomfort. Telemetry monitoring called and aware of arrival. Will continue to monitor and provide care per MD orders.

## 2013-06-10 NOTE — Discharge Instructions (Signed)
Video-Assisted Thoracic Surgery °Care After °Refer to this sheet in the next few weeks. These instructions provide you with information on caring for yourself after your procedure. Your caregiver may also give you more specific instructions. Your procedure has been planned according to current medical practices, but problems sometimes occur. Call your caregiver if you have any problems or questions after your procedure. °HOME CARE INSTRUCTIONS  °· Only take over-the-counter or prescription medications as directed. °· Only take pain medications (narcotics) as directed. °· Do not drive until your caregiver approves. Driving while taking narcotics or soon after surgery can be dangerous, so discuss the specific timing with your caregiver. °· Avoid activities that use your chest muscles, such as lifting heavy objects, for at least 3 4 weeks.   °· Take deep breaths to expand the lungs and to protect against pneumonia. °· Do breathing exercises as directed by your caregiver. If you were given an incentive spirometer to help with breathing, use it as directed. °· You may resume a normal diet and activities when you feel you are able to or as directed. °· Do not take a bath until your caregiver says it is OK. Use the shower instead.   °· Keep the bandage (dressing) covering the area where the chest tube was inserted (incision site) dry for 48 hours. After 48 hours, remove the dressing unless there is new drainage. °· Remove dressings as directed by your caregiver. °· Change dressings if necessary or as directed. °· Keep all follow-up appointments. It is important for you to see your caregiver after surgery to discuss appropriate follow-up care and surveillance, if it is necessary. °SEEK MEDICAL CARE: °· You feel excessive or increasing pain at an incision site. °· You notice bleeding, skin irritation, drainage, swelling, or redness at an incision site. °· There is a bad smell coming from an incision or dressing. °· It feels  like your heart is fluttering or beating rapidly. °· Your pain medication does not relieve your pain. °SEEK IMMEDIATE MEDICAL CARE IF:  °· You have a fever.   °· You have chest pain.  °· You have a rash. °· You have shortness of breath. °· You have trouble breathing.   °· You feel weak, lightheaded, dizzy, or faint.   °MAKE SURE YOU:  °· Understand these instructions.   °· Will watch your condition.   °· Will get help right away if you are not doing well or get worse. °Document Released: 05/31/2012 Document Reviewed: 05/31/2012 °Exit[employer]® Patient Information ©2014 [employer]. ° °

## 2013-06-10 NOTE — Progress Notes (Signed)
       [address]       ,[place] [address]             [phone]        2 Days Post-Op Procedure(s) (LRB): VIDEO BRONCHOSCOPY (N/A) VIDEO ASSISTED THORACOSCOPY (VATS)/right upper lobectomy, On Q (Right) MINI/LIMITED THORACOTOMY; right middle lobe wedge resection LYMPH NODE DISSECTION (Right)  Subjective:  [name] has no complaints this morning.  She is doing great and has ambulated 2laps this morning.    Objective: Vital signs in last 24 hours: Temp:  [97.6 F (36.4 C)-98.2 F (36.8 C)] 98.2 F (36.8 C) (04/24 0714) Pulse Rate:  [72-109] 74 (04/24 0500) Cardiac Rhythm:  [-] Normal sinus rhythm (04/24 0400) Resp:  [12-29] 21 (04/24 0600) BP: (112-148)/(59-97) 116/65 mmHg (04/24 0500) SpO2:  [97 %-100 %] 98 % (04/24 0500) Weight:  [107 lb 2.3 oz (48.6 kg)] 107 lb 2.3 oz (48.6 kg) (04/24 0300)  Intake/Output from previous day: 04/23 0701 - 04/24 0700 In: 743 [P.O.:250; I.V.:493] Out: 405 [Urine:285; Chest Tube:120] Intake/Output this shift:    General appearance: alert, cooperative and no distress Heart: regular rate and rhythm Lungs: clear to auscultation bilaterally Abdomen: soft, non-tender; bowel sounds normal; no masses,  no organomegaly Wound: clean and dry   Lab Results:  Recent Labs  06/09/13 0359 06/10/13 0355  WBC 7.1 9.2  HGB 9.7* 9.9*  HCT 29.4* 30.1*  PLT 153 168   BMET:   Recent Labs  06/09/13 0359 06/10/13 0355  NA 137 134*  K 4.6 4.3  CL 102 97  CO2 28 27  GLUCOSE 92 99  BUN 10 11  CREATININE 0.69 0.64  CALCIUM 8.7 8.9    PT/INR: No results found for this basename: LABPROT, INR,  in the last 72 hours ABG    Component Value Date/Time   PHART 7.408 06/08/2013 0448   HCO3 26.4* 06/08/2013 0448   TCO2 27.7 06/08/2013 0448   O2SAT 99.3 06/08/2013 0448   CBG (last 3)  No results found for this basename: GLUCAP,  in the last 72 hours  Assessment/Plan: S/P Procedure(s) (LRB): VIDEO BRONCHOSCOPY  (N/A) VIDEO ASSISTED THORACOSCOPY (VATS)/right upper lobectomy, On Q (Right) MINI/LIMITED THORACOTOMY; right middle lobe wedge resection LYMPH NODE DISSECTION (Right)  1. Chest tube-+ air leak today with cough- leave chest tube in place to water seal 2. Pulm- good use of IS 3. Dispo- [name] looks great, she is ambulating without difficulty, waiting for transfer to stepdown 4 path still pending  LOS: 3 days    [name] 06/10/2013  Path still pending For transfer to 3300 I have seen and examined [name] and agree with the above assessment  and plan.  [name] MD Beeper [phone] Office [phone] 06/10/2013 8:00 AM

## 2013-06-10 NOTE — Progress Notes (Signed)
Utilization review completed. Latanza Pfefferkorn, RN, BSN. 

## 2013-06-11 ENCOUNTER — Inpatient Hospital Stay (HOSPITAL_COMMUNITY): Payer: Medicare PPO

## 2013-06-11 LAB — TYPE AND SCREEN
ABO/RH(D): A POS
Antibody Screen: NEGATIVE
Unit division: 0
Unit division: 0

## 2013-06-11 LAB — BASIC METABOLIC PANEL
BUN: 14 mg/dL (ref 6–23)
CO2: 28 mEq/L (ref 19–32)
Calcium: 8.9 mg/dL (ref 8.4–10.5)
Chloride: 93 mEq/L — ABNORMAL LOW (ref 96–112)
Creatinine, Ser: 0.63 mg/dL (ref 0.50–1.10)
GFR calc Af Amer: >90 mL/min (ref >90)
GFR calc non Af Amer: 82 mL/min — ABNORMAL LOW (ref >90)
Glucose, Bld: 108 mg/dL — ABNORMAL HIGH (ref 70–99)
Potassium: 4.2 mEq/L (ref 3.7–5.3)
Sodium: 131 mEq/L — ABNORMAL LOW (ref 137–147)

## 2013-06-11 LAB — CBC
HCT: 27.4 % — ABNORMAL LOW (ref 36.0–46.0)
Hemoglobin: 9.2 g/dL — ABNORMAL LOW (ref 12.0–15.0)
MCH: 29.3 pg (ref 26.0–34.0)
MCHC: 33.6 g/dL (ref 30.0–36.0)
MCV: 87.3 fL (ref 78.0–100.0)
Platelets: 182 10*3/uL (ref 150–400)
RBC: 3.14 MIL/uL — ABNORMAL LOW (ref 3.87–5.11)
RDW: 13.7 % (ref 11.5–15.5)
WBC: 7.7 10*3/uL (ref 4.0–10.5)

## 2013-06-11 MED ORDER — ALUM & MAG HYDROXIDE-SIMETH 200-200-20 MG/5ML PO SUSP
30.0000 mL | ORAL | Status: DC | PRN
  Filled 2013-06-11: qty 30

## 2013-06-11 NOTE — Progress Notes (Addendum)
      [address]       [place] [address]             [phone]       4 Days Post-Op Procedure(s) (LRB): VIDEO BRONCHOSCOPY (N/A) VIDEO ASSISTED THORACOSCOPY (VATS)/right upper lobectomy, On Q (Right) MINI/LIMITED THORACOTOMY; right middle lobe wedge resection LYMPH NODE DISSECTION (Right)  Subjective: Patient had nausea but no emesis and the "spins" yesterday. She feels it was secondary to Ultram. Tolerating Oxycodone after eating.  Objective: Vital signs in last 24 hours: Temp:  [97.8 F (36.6 C)-99 F (37.2 C)] 98.6 F (37 C) (04/25 0829) Pulse Rate:  [81-98] 81 (04/25 0529) Cardiac Rhythm:  [-] Normal sinus rhythm (04/24 1621) Resp:  [15-29] 22 (04/25 0529) BP: (108-136)/(46-81) 108/56 mmHg (04/25 0829) SpO2:  [93 %-100 %] 99 % (04/25 0529) Weight:  [107 lb 2.3 oz (48.6 kg)] 107 lb 2.3 oz (48.6 kg) (04/24 1621)  Intake/Output from previous day: 04/24 0701 - 04/25 0700 In: 900 [P.O.:840; I.V.:60] Out: 100 [Chest Tube:100]   Physical Exam:  Cardiovascular: RRR,  Pulmonary: Clear to auscultation on left and slightly diminished right; no rales, wheezes, or rhonchi. Abdomen: Soft, non tender, bowel sounds present. Extremities: No lower extremity edema. Wounds: Clean and dry.  No erythema or signs of infection. Chest Tubes: to water seal and small air leak (tidling with cough)  Lab Results: CBC: Recent Labs  06/10/13 0355 06/11/13 0530  WBC 9.2 7.7  HGB 9.9* 9.2*  HCT 30.1* 27.4*  PLT 168 182   BMET:  Recent Labs  06/10/13 0355 06/11/13 0530  NA 134* 131*  K 4.3 4.2  CL 97 93*  CO2 27 28  GLUCOSE 99 108*  BUN 11 14  CREATININE 0.64 0.63  CALCIUM 8.9 8.9    PT/INR: No results found for this basename: LABPROT, INR,  in the last 72 hours ABG:  INR: Will add last result for INR, ABG once components are confirmed Will add last 4 CBG results once components are confirmed  Assessment/Plan:  1. CV - SR 2.  Pulmonary - Chest  tubes with 100 cc of output last 24 hours. There is a small air leak (tidling with cough). As discussed with Dr. Prescott Gum, removed posterior chest tube only today. CXR shows stable, small right apical pneumothorax. Encourage incentive spirometer. Pathology results still pending. 3. ABL anemia- H and H 9.2 and 27.4  Donielle M ZimmermanPA-C 06/11/2013,8:59 AM  Chart reviewed, patient examined, agree with above.  She has a small intermittent air leak. Agree with removing posterior chest tube and continuing anterior tube to water seal.

## 2013-06-11 NOTE — Progress Notes (Signed)
Posterior chest tube D/C per order. Patient tolerated well. Patient didn't require any pain med.

## 2013-06-12 ENCOUNTER — Inpatient Hospital Stay (HOSPITAL_COMMUNITY): Payer: Medicare PPO

## 2013-06-12 MED ORDER — LACTULOSE 10 GM/15ML PO SOLN
20.0000 g | Freq: Once | ORAL | Status: AC
  Administered 2013-06-12: 20 g via ORAL
  Filled 2013-06-12: qty 30

## 2013-06-12 NOTE — Progress Notes (Addendum)
      [address]       [place] [address]             [phone]       5 Days Post-Op Procedure(s) (LRB): VIDEO BRONCHOSCOPY (N/A) VIDEO ASSISTED THORACOSCOPY (VATS)/right upper lobectomy, On Q (Right) MINI/LIMITED THORACOTOMY; right middle lobe wedge resection LYMPH NODE DISSECTION (Right)  Subjective: Patient with constipation.  Objective: Vital signs in last 24 hours: Temp:  [98.3 F (36.8 C)-99.1 F (37.3 C)] 98.4 F (36.9 C) (04/26 0700) Pulse Rate:  [84-96] 89 (04/26 0332) Cardiac Rhythm:  [-] Normal sinus rhythm (04/26 0332) Resp:  [19-21] 21 (04/26 0332) BP: (107-131)/(48-68) 108/53 mmHg (04/26 0332) SpO2:  [97 %-98 %] 97 % (04/26 0332)  Intake/Output from previous day: 04/25 0701 - 04/26 0700 In: 220 [P.O.:180; I.V.:40] Out: 90 [Chest Tube:90]   Physical Exam:  Cardiovascular: RRR,  Pulmonary: Clear to auscultation on left and slightly diminished right; no rales, wheezes, or rhonchi. Abdomen: Soft, non tender, bowel sounds present. Extremities: No lower extremity edema. Wounds: Clean and dry.  No erythema or signs of infection. Chest Tubes: to water seal and minor tidling with cough  Lab Results: CBC:  Recent Labs  06/10/13 0355 06/11/13 0530  WBC 9.2 7.7  HGB 9.9* 9.2*  HCT 30.1* 27.4*  PLT 168 182   BMET:   Recent Labs  06/10/13 0355 06/11/13 0530  NA 134* 131*  K 4.3 4.2  CL 97 93*  CO2 27 28  GLUCOSE 99 108*  BUN 11 14  CREATININE 0.64 0.63  CALCIUM 8.9 8.9    PT/INR: No results found for this basename: LABPROT, INR,  in the last 72 hours ABG:  INR: Will add last result for INR, ABG once components are confirmed Will add last 4 CBG results once components are confirmed  Assessment/Plan:  1. CV - SR 2.  Pulmonary - Chest tube with 90 cc of output last 24 hours. There is minor tidling with cough. Hope to remove remaining chest tube soon.CXR shows no pneumothorax, atelectasis on the right. Encourage  incentive spirometer. Pathology results still pending. 3. ABL anemia- H and H 9.2 and 27.4 4. LOC constipation  Raven M ZimmermanPA-C 06/12/2013,9:17 AM    Chart reviewed, patient examined, agree with above. I don't see any air leak and no ptx on chest x-ray. Will remove the tube and do a follow up CXR today. Repeat it in the am and if ok she could go home. Path still pending.

## 2013-06-12 NOTE — Progress Notes (Signed)
Remaining chest tube removed per ordered. Site looks unremarkable. Have requested portable chest xray. Patient tolerated well.

## 2013-06-13 ENCOUNTER — Inpatient Hospital Stay (HOSPITAL_COMMUNITY): Payer: Medicare PPO

## 2013-06-13 MED ORDER — OXYCODONE HCL 5 MG PO TABS: 5.0000 mg | ORAL_TABLET | ORAL | Status: DC | PRN

## 2013-06-13 MED ORDER — ONDANSETRON 4 MG PO TBDP: 4.0000 mg | ORAL_TABLET | Freq: Three times a day (TID) | ORAL | Status: DC | PRN

## 2013-06-13 MED ORDER — PNEUMOCOCCAL VAC POLYVALENT 25 MCG/0.5ML IJ INJ
0.5000 mL | INJECTION | INTRAMUSCULAR | Status: DC
  Filled 2013-06-13: qty 0.5

## 2013-06-13 MED ORDER — PNEUMOCOCCAL VAC POLYVALENT 25 MCG/0.5ML IJ INJ
0.5000 mL | INJECTION | Freq: Once | INTRAMUSCULAR | Status: AC
  Filled 2013-06-13: qty 0.5

## 2013-06-13 NOTE — Progress Notes (Signed)
DC instructions given to patient and daughter. Questions answered. VSS. Central line removed without complication, hemostasis achieved. Pt on bedrest until 1445. eICU and CCMT notified of discharge. Encouraged use of IS and deep breathing. Rx given to patient. Junie Panning, [place] called for zofran request by pt--rx called to pharmacy by Junie Panning, [place]: Pt accompanied by NT via wheelchair to private vehicle driven by daughter. All belongings sent with patient.

## 2013-06-13 NOTE — Progress Notes (Signed)
Physician notified: Junie Panning, [place] At: [phone]  Regarding: please re-complete med rec- PNA given this AM.  Awaiting return response.   Returned Response at: [employer] finish med rec for DC.

## 2013-06-13 NOTE — Plan of Care (Signed)
Problem: Discharge Progression Outcomes Goal: Pneumonia & Flu vaccines given if indicated Outcome: Progressing Will receive upon DC.

## 2013-06-13 NOTE — Progress Notes (Addendum)
      [address]       [place] [address]             [phone]      6 Days Post-Op Procedure(s) (LRB): VIDEO BRONCHOSCOPY (N/A) VIDEO ASSISTED THORACOSCOPY (VATS)/right upper lobectomy, On Q (Right) MINI/LIMITED THORACOTOMY; right middle lobe wedge resection LYMPH NODE DISSECTION (Right)  Subjective:  [name] has no complaints this morning.  She is ready to go home.  + Ambulation + BM  Objective: Vital signs in last 24 hours: Temp:  [98.3 F (36.8 C)-98.9 F (37.2 C)] 98.9 F (37.2 C) (04/27 0753) Pulse Rate:  [84-99] 92 (04/27 0753) Cardiac Rhythm:  [-] Normal sinus rhythm (04/27 0310) Resp:  [19-24] 24 (04/27 0753) BP: (103-127)/(46-57) 107/49 mmHg (04/27 0753) SpO2:  [92 %-100 %] 97 % (04/27 0753)  Intake/Output from previous day: 04/26 0701 - 04/27 0700 In: 440 [P.O.:420; I.V.:20] Out: 0   General appearance: alert, cooperative and no distress Heart: regular rate and rhythm Lungs: clear to auscultation bilaterally Abdomen: soft, non-tender; bowel sounds normal; no masses,  no organomegaly Wound: clean and dry  Lab Results:  Recent Labs  06/11/13 0530  WBC 7.7  HGB 9.2*  HCT 27.4*  PLT 182   BMET:  Recent Labs  06/11/13 0530  NA 131*  K 4.2  CL 93*  CO2 28  GLUCOSE 108*  BUN 14  CREATININE 0.63  CALCIUM 8.9    PT/INR: No results found for this basename: LABPROT, INR,  in the last 72 hours ABG    Component Value Date/Time   PHART 7.408 06/08/2013 0448   HCO3 26.4* 06/08/2013 0448   TCO2 27.7 06/08/2013 0448   O2SAT 99.3 06/08/2013 0448   CBG (last 3)  No results found for this basename: GLUCAP,  in the last 72 hours  Assessment/Plan: S/P Procedure(s) (LRB): VIDEO BRONCHOSCOPY (N/A) VIDEO ASSISTED THORACOSCOPY (VATS)/right upper lobectomy, On Q (Right) MINI/LIMITED THORACOTOMY; right middle lobe wedge resection LYMPH NODE DISSECTION (Right)  1.  Pulm- chest tube removed yesterday, CXR remains stable with no  change in apical pneumothorax 2. LOC Constipation- resolved 3. 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  [name] MD Beeper [phone] Office [phone] 06/13/2013 11:02 AM

## 2013-06-14 ENCOUNTER — Encounter (HOSPITAL_COMMUNITY): Payer: Self-pay | Admitting: Cardiothoracic Surgery

## 2013-06-20 ENCOUNTER — Encounter: Payer: Self-pay | Admitting: Cardiothoracic Surgery

## 2013-06-20 ENCOUNTER — Ambulatory Visit (INDEPENDENT_AMBULATORY_CARE_PROVIDER_SITE_OTHER): Payer: Self-pay | Admitting: *Deleted

## 2013-06-20 DIAGNOSIS — Z4802 Encounter for removal of sutures: Secondary | ICD-10-CM

## 2013-06-20 DIAGNOSIS — C3410 Malignant neoplasm of upper lobe, unspecified bronchus or lung: Secondary | ICD-10-CM

## 2013-06-20 DIAGNOSIS — D381 Neoplasm of uncertain behavior of trachea, bronchus and lung: Secondary | ICD-10-CM

## 2013-06-20 DIAGNOSIS — Z902 Acquired absence of lung [part of]: Secondary | ICD-10-CM

## 2013-06-20 NOTE — Progress Notes (Signed)
[name] returns for suture removal from two previous chest tube sites s/p R VATS, RULobectomy on 06/07/13.  This was easily done.  These sites, as well as the mini thoracotomy incision are all very well healed.  She only relates a problem with constipation which she is addressing.  She will return next week as scheduled with a chest xray.

## 2013-06-24 ENCOUNTER — Other Ambulatory Visit: Payer: Self-pay | Admitting: Cardiothoracic Surgery

## 2013-06-24 DIAGNOSIS — C3410 Malignant neoplasm of upper lobe, unspecified bronchus or lung: Secondary | ICD-10-CM

## 2013-06-27 ENCOUNTER — Ambulatory Visit (INDEPENDENT_AMBULATORY_CARE_PROVIDER_SITE_OTHER): Payer: Self-pay | Admitting: Physician Assistant

## 2013-06-27 ENCOUNTER — Ambulatory Visit
Admission: RE | Admit: 2013-06-27 | Discharge: 2013-06-27 | Disposition: A | Discharge status: Home / Self Care | Payer: Medicare PPO | Source: Ambulatory Visit | Attending: Cardiothoracic Surgery | Admitting: Cardiothoracic Surgery

## 2013-06-27 VITALS — BP 109/63 | HR 92 | Resp 16 | Ht 63.0 in | Wt 102.0 lb

## 2013-06-27 DIAGNOSIS — C3410 Malignant neoplasm of upper lobe, unspecified bronchus or lung: Secondary | ICD-10-CM

## 2013-06-27 DIAGNOSIS — Z09 Encounter for follow-up examination after completed treatment for conditions other than malignant neoplasm: Secondary | ICD-10-CM

## 2013-06-27 NOTE — Patient Instructions (Signed)
Thoracotomy, Care After Refer to this sheet in the next few weeks. These instructions provide you with information on caring for yourself after your procedure. Your health care provider may also give you more specific instructions. Your treatment has been planned according to current medical practices, but problems sometimes occur. Call your health care provider if you have any problems or questions after your procedure. WHAT TO EXPECT AFTER YOUR PROCEDURE After your procedure, it is typical to have the following sensations:  You may feel pain at the incision site.  You may be constipated from the pain medicine given and the change in your level of activity.  You may feel extremely tired. HOME CARE INSTRUCTIONS  Take over-the-counter or prescription medicines for pain, discomfort, or fever only as directed by your health care provider. It is very important to take pain relieving medicine before your pain becomes severe. You will be able to breathe and cough more comfortably if your pain is well controlled.  Take deep breaths. Deep breathing helps to keep your lungs inflated and protects against a lung infection (pneumonia).  Cough frequently. Even though coughing may cause discomfort, coughing is important to clear mucus (phlegm) and expand your lungs. Coughing helps prevent pneumonia. If it hurts to cough, hold a pillow against your chest when you cough. This may help with the discomfort.  Continue to use an incentive spirometer as directed. The use of an incentive spirometer helps to keep your lungs inflated and protects against pneumonia.  Change the bandages over your incision as needed or as directed by your health care provider.  Remove the bandages over your chest tube site as directed by your health care provider.  Resume your normal diet as directed. It is important to have adequate protein, calories, vitamins, and minerals to promote healing.  Prevent constipation.  Eat  high-fiber foods such as whole grain cereals and breads, brown rice, beans, and fresh fruits and vegetables.  Drink enough water and fluids to keep your urine clear or pale yellow. Avoid drinking beverages containing caffeine. Beverages containing caffeine can cause dehydration and harden your stool.  Talk to your health care provider about taking a stool softener or laxative.  Avoid lifting or driving until you are instructed otherwise.  Shower rather than bathe until you see your health care provider, or as instructed. Gently wash the area of your incision with water and soap as directed. Do not use anything else to clean your incision except as directed by your health care provider.  Do not smoke or use tobacco products.  Avoid secondhand smoke.  Schedule an appointment for stitch (suture) or staple removal as directed.  Schedule and attend all follow-up visits as directed by your health care provider. It is important to keep all your appointments.  Participate in pulmonary rehabilitation as directed by your health care provider.  Do not travel by airplane for 2 weeks after your chest tube is removed. SEEK MEDICAL CARE IF:  You are bleeding from your wounds.  Your heartbeat seems irregular.  You have redness, swelling, or increasing pain in the wounds.  There is pus coming from your wounds.  There is a bad smell coming from the wound or dressing.  You have a fever or chills.  You have nausea or are vomiting.  You have muscle aches. SEEK IMMEDIATE MEDICAL CARE IF:  You have a rash.  You have difficulty breathing.  You have a reaction or side effect to medicines given.  You have persistent nausea.  You have lightheadedness or feel faint.  You have shortness of breath or chest pain.  You have persistent pain. Document Released: 07/19/2010 Document Revised: 11/24/2012 Document Reviewed: 09/22/2012 [facility] Patient Information 2014 [place], [place].

## 2013-06-27 NOTE — Progress Notes (Signed)
[address]       [place] [address]             [phone]                  Kang L [place] Medical Record #[id number] Date of Birth: 1930-04-20  Referring EX:[id number], Gunnar Fusi, MD Primary Cardiology: Primary Care:Katherine Birdie Riddle, MD  Chief Complaint:  Follow Up Visit  Surgery:Video bronchoscopy, right video-assisted  thoracoscopy, mini-thoracotomy, right upper lobectomy with lymph node  dissection, wedge resection of right middle lobe, placement of On-Q.  History of Present Illness:     The patient is an 80 year old female, who for several years, has had evidence of a right upper lobe lung mass that has progressively enlarged in size. Initially, this was followed by pulmonology; however, recent CT scan showed the right upper lobe mass had enlarged in size and was mildly hypermetabolic. An ENB was performed and confirmed adenocarcinoma of the lung. After consultation with radiation, the patient was offered lobectomy. She presents for routine follow up s/p right mini thoracotomy and right upper lobectomy and wedge resection of the right middle lobe on 06/07/2013. Her complaints include tremor right hand, increasing neuropathy symptoms, and an intermittent, discomfort in right upper quadrant area.    Zubrod Score: At the time of surgery this patient's most appropriate activity status/level should be described as: []     0    Normal activity, no symptoms [x]     1    Restricted in physical strenuous activity but ambulatory, able to do out light work []     2    Ambulatory and capable of self care, unable to do work activities, up and about                 >50 % of waking hours                                                                                   []     3    Only limited self care, in bed greater than 50% of waking hours []     4    Completely disabled, no self care, confined to bed or chair []     5    Moribund  History  Smoking status  .  Never Smoker   Smokeless tobacco  . Never Used       Allergies  Allergen Reactions  . Prochlorperazine Edisylate Anaphylaxis    Compazine  . Aspirin     REACTION: nose bleeds    Current Outpatient Prescriptions  Medication Sig Dispense Refill  . acetaminophen (TYLENOL) 500 MG tablet Take 1,000 mg by mouth every 6 (six) hours as needed for mild pain.       . Biotin 1000 MCG tablet Take 1,000 mcg by mouth 2 (two) times daily.        . Calcium Carbonate-Vitamin D (CALCIUM 500 + D) 500-125 MG-UNIT TABS Take 1 tablet by mouth daily.       Marland Kitchen docusate sodium (STOOL SOFTENER) 100 MG capsule Take 100 mg by mouth 2 (two) times daily.        Marland Kitchen  estradiol (ESTRACE) 0.1 MG/GM vaginal cream Place 1 Applicatorful vaginally 2 (two) times a week. On Wednesday and Sunday.      . gabapentin (NEURONTIN) 600 MG tablet Take 600 mg by mouth 3 (three) times daily.      . Glucosamine-Chondroit-Vit C-Mn (GLUCOSAMINE CHONDR 1500 COMPLX PO) Take 1 capsule by mouth daily.       . hydroxychloroquine (PLAQUENIL) 200 MG tablet Take 200 mg by mouth 2 (two) times daily.       . meloxicam (MOBIC) 15 MG tablet Take 15 mg by mouth daily.        . Multiple Vitamin (MULTIVITAMIN) tablet Take 1 tablet by mouth daily.        Marland Kitchen omeprazole (PRILOSEC) 20 MG capsule Take 1 capsule (20 mg total) by mouth 2 (two) times daily before a meal.  60 capsule  11  . ondansetron (ZOFRAN ODT) 4 MG disintegrating tablet Take 1 tablet (4 mg total) by mouth every 8 (eight) hours as needed for nausea or vomiting.  20 tablet  0  . oxyCODONE (OXY IR/ROXICODONE) 5 MG immediate release tablet Take 1-2 tablets (5-10 mg total) by mouth every 4 (four) hours as needed for severe pain.  30 tablet  0  . Polyethyl Glycol-Propyl Glycol (SYSTANE OP) Place 1 drop into both eyes 2 (two) times daily as needed (dry eyes).       No current facility-administered medications for this visit.     Physical Exam: BP 109/63  Pulse 92  Resp 16  Ht 5\' 3"  (1.6 m)   Wt 102 lb (46.267 kg)  BMI 18.07 kg/m2  SpO2 97%  General appearance: alert, cooperative and no distress Heart: regular rate and rhythm Lungs: left lung is clear; diminished at right apex but right base is clear Abdomen: Soft, minimal tenderness with palpation to right upper quadrant, minor distention, bowel sounds present Extremities: no edema Wound: clean and dry. Eschar removed. No signs of infection   Diagnostic Studies & Laboratory data:         Recent Radiology Findings: Dg Chest 2 View  06/27/2013   CLINICAL DATA:  Chest pain with known malignant neoplasm status post surgery April 21st now with anterior right chest pain  EXAM: CHEST  2 VIEW  COMPARISON:  DG CHEST 2 VIEW dated 06/13/2013  FINDINGS: The pneumothorax on the right is again demonstrated. It has not significantly increased in size. The pleural line lies between the third and fourth posterior ribs on the right. There is a new meniscus visible consistent with the presence of pleural fluid in a subpulmonic location however. The left lung is well expanded and clear. There is no shift of the midline. The cardiac silhouette is normal in size. There is soft tissue prominence of the right hilar region which is stable. The observed portions of the bony thorax exhibit no acute abnormalities but there is severe dextroscoliosis of the lumbar spine.  IMPRESSION: There is a persistent pneumothorax on the right not significantly increased in size since the previous study. However, fluid is now clearly visible in the subpulmonic space consistent with a hydropneumothorax. No mediastinal shift has developed. The left lung is clear.  These results were called by telephone at the time of interpretation on 06/27/2013 at 1:43 PM to Levonne Spiller, RN,, who verbally acknowledged these results.   Electronically Signed   By: David  Martinique   On: 06/27/2013 13:45      Recent Labs: Lab Results  Component Value Date   WBC  7.7 06/11/2013   HGB 9.2* 06/11/2013     HCT 27.4* 06/11/2013   PLT 182 06/11/2013   GLUCOSE 108* 06/11/2013   CHOL 208* 08/13/2011   TRIG 65.0 08/13/2011   HDL 82.10 08/13/2011   LDLDIRECT 100.9 08/13/2011   LDLCALC 102* 03/07/2010   ALT 13 06/09/2013   AST 26 06/09/2013   NA 131* 06/11/2013   K 4.2 06/11/2013   CL 93* 06/11/2013   CREATININE 0.63 06/11/2013   BUN 14 06/11/2013   CO2 28 06/11/2013   TSH 4.22 08/13/2011   INR 0.89 06/02/2013      Assessment / Plan:    Overall, Mrs. Zollinger is recovering well from right lung surgery. She is taking one Oxy IR at night for pain. She asked for a refill, which she was given for the Oxy IR. She was instructed not to lift more than 10 pounds for at least 2 more weeks. As long as she is not taking Oxy during the day, she may begin driving short distances (i.e. 30 minutes or less during the day). She may increase her frequency and duration as tolerates. Regarding her right hand tremor, increasing neuropathy symptoms, and RUQ pain, she is going to see her neurologist and primary care physician. She was instructed she may resume assisting in taking care of her husband. She is going to return to see Dr. Servando Snare in 2 weeks with a chest x ray.   Maleiya Pergola Liston Alba PA-C 06/27/2013 1:48 PM

## 2013-07-07 ENCOUNTER — Ambulatory Visit (INDEPENDENT_AMBULATORY_CARE_PROVIDER_SITE_OTHER): Payer: Medicare PPO | Admitting: Family Medicine

## 2013-07-07 ENCOUNTER — Encounter: Payer: Self-pay | Admitting: Family Medicine

## 2013-07-07 ENCOUNTER — Ambulatory Visit: Payer: Medicare PPO | Admitting: Family Medicine

## 2013-07-07 VITALS — BP 122/72 | HR 91 | Temp 98.2°F | Resp 16 | Wt 106.0 lb

## 2013-07-07 DIAGNOSIS — K5909 Other constipation: Secondary | ICD-10-CM

## 2013-07-07 DIAGNOSIS — M792 Neuralgia and neuritis, unspecified: Secondary | ICD-10-CM | POA: Insufficient documentation

## 2013-07-07 DIAGNOSIS — E43 Unspecified severe protein-calorie malnutrition: Secondary | ICD-10-CM

## 2013-07-07 DIAGNOSIS — D509 Iron deficiency anemia, unspecified: Secondary | ICD-10-CM

## 2013-07-07 DIAGNOSIS — I10 Essential (primary) hypertension: Secondary | ICD-10-CM

## 2013-07-07 DIAGNOSIS — C3410 Malignant neoplasm of upper lobe, unspecified bronchus or lung: Secondary | ICD-10-CM

## 2013-07-07 DIAGNOSIS — IMO0002 Reserved for concepts with insufficient information to code with codable children: Secondary | ICD-10-CM

## 2013-07-07 DIAGNOSIS — K5903 Drug induced constipation: Secondary | ICD-10-CM

## 2013-07-07 MED ORDER — POLYETHYLENE GLYCOL 3350 17 GM/SCOOP PO POWD: 17.0000 g | Freq: Two times a day (BID) | ORAL | Status: DC | PRN

## 2013-07-07 NOTE — Progress Notes (Signed)
   Subjective:    Patient ID: [name], female    DOB: Jul 22, 1930, 80 y.o.   MRN: [id number]  HPI F/U- pt had recent (1 month) RUL and RML resection for adenocarcinoma.  Has switched Rheum (Dr Abner Greenspan left practice) to Dr Earnest Conroy [facility])  Pt had surgery w/ Dr Servando Snare.  Pt following w/ Neurology (Dr Baltazar Najjar, [facility]) for neuropathy.  Has had 2 back injections.  Pt having difficulty sleeping due to pain- unable to lie on R side (which is her preferred side) due to surgery, unable to sleep on back due to chronic pain.  Anemia- chronic problem for pt.  Due for repeat CBC  Constipation- pt struggling due to ongoing use of pain medication at night.  Taking stool softener daily.  Pt has been advised by friends and family to take benefiber, miralax.  Pt doesn't know what to do.  'sore spot'- just to R of umbilicus.  No visual evidence of problem- no bruise.  Pain is superficial, painful to touch.   Review of Systems For ROS see HPI     Objective:   Physical Exam  Vitals reviewed. Constitutional: She appears well-developed and well-nourished. No distress.  HENT:  Head: Normocephalic and atraumatic.  Cardiovascular: Normal rate, regular rhythm and intact distal pulses.   Pulmonary/Chest: Effort normal and breath sounds normal. No respiratory distress. She has no wheezes. She has no rales.  Abdominal: Soft. She exhibits no distension. There is tenderness (superficial tenderness to light touch just R of umbilicus). There is no rebound and no guarding.  Lymphadenopathy:    She has no cervical adenopathy.  Skin: Skin is warm and dry.  Psychiatric: She has a normal mood and affect. Her behavior is normal. Thought content normal.          Assessment & Plan:

## 2013-07-07 NOTE — Progress Notes (Signed)
Pre visit review using our clinic review tool, if applicable. No additional management support is needed unless otherwise documented below in the visit note. 

## 2013-07-07 NOTE — Patient Instructions (Signed)
Follow up in 3 months to recheck weight and recovery Continue the Neurontin for the neuropathic pain We'll notify you of your lab results and make any changes if needed Start Miralax once daily for constipation (continue your stool softener) Make sure you are eating regularly- small amounts but frequently Please start protein shakes at least daily Call with any questions or concerns Hang in there! YOU ARE AMAZING!!!

## 2013-07-08 ENCOUNTER — Telehealth: Payer: Self-pay | Admitting: *Deleted

## 2013-07-08 LAB — CBC WITH DIFFERENTIAL/PLATELET
BASOS ABS: 0 10*3/uL (ref 0.0–0.1)
Basophils Relative: 0.2 % (ref 0.0–3.0)
Eosinophils Absolute: 0.5 10*3/uL (ref 0.0–0.7)
Eosinophils Relative: 11.7 % — ABNORMAL HIGH (ref 0.0–5.0)
HCT: 28.2 % — ABNORMAL LOW (ref 36.0–46.0)
HEMOGLOBIN: 9.4 g/dL — AB (ref 12.0–15.0)
LYMPHS PCT: 12.8 % (ref 12.0–46.0)
Lymphs Abs: 0.5 10*3/uL — ABNORMAL LOW (ref 0.7–4.0)
MCHC: 33.5 g/dL (ref 30.0–36.0)
MCV: 87.6 fl (ref 78.0–100.0)
MONOS PCT: 11.6 % (ref 3.0–12.0)
Monocytes Absolute: 0.4 10*3/uL (ref 0.1–1.0)
Neutro Abs: 2.5 10*3/uL (ref 1.4–7.7)
Neutrophils Relative %: 63.7 % (ref 43.0–77.0)
PLATELETS: 168 10*3/uL (ref 150.0–400.0)
RBC: 3.22 Mil/uL — ABNORMAL LOW (ref 3.87–5.11)
RDW: 14.3 % (ref 11.5–15.5)
WBC: 3.9 10*3/uL — ABNORMAL LOW (ref 4.0–10.5)

## 2013-07-12 ENCOUNTER — Ambulatory Visit: Payer: Medicare PPO | Admitting: Cardiothoracic Surgery

## 2013-07-12 NOTE — Assessment & Plan Note (Signed)
New.  Pt's abdominal pain is neuropathic and most likely due to nerve disruption from her recent surgery.  Pt has pain meds available and is on neurontin already.  Discussed that this is not uncommon and may improve w/ time.  Will continue to follow.

## 2013-07-12 NOTE — Assessment & Plan Note (Signed)
New to provider, noted by surgeon.  Discussed w/ pt that this could impact her healing.  Daughter willing to make her protein based smoothies.  Pt agreeable to this.  Will follow.

## 2013-07-12 NOTE — Assessment & Plan Note (Signed)
New to provider.  Pt s/p RUL resection.  Following w/ cardiothoracic surgery and oncology.  Pt seems to be doing remarkably well given all she's been through both emotionally and physically.  Will continue to follow and assist as able.

## 2013-07-12 NOTE — Assessment & Plan Note (Signed)
Pt has hx of anemia but this worsened in setting of recent surgeries.  Will get repeat CBC to trend.  Will hold on iron for now as pt is already struggling w/ constipation.  Will follow.

## 2013-07-12 NOTE — Assessment & Plan Note (Signed)
New.  Encouraged pt to start miralax as long as she is on pain medication bc this will safely yet effectively help her.  Pt in agreement.

## 2013-07-13 ENCOUNTER — Encounter: Payer: Self-pay | Admitting: *Deleted

## 2013-07-13 ENCOUNTER — Encounter (HOSPITAL_COMMUNITY): Payer: Self-pay

## 2013-07-14 ENCOUNTER — Ambulatory Visit (INDEPENDENT_AMBULATORY_CARE_PROVIDER_SITE_OTHER): Payer: Self-pay | Admitting: Cardiothoracic Surgery

## 2013-07-14 ENCOUNTER — Encounter: Payer: Self-pay | Admitting: Internal Medicine

## 2013-07-14 ENCOUNTER — Encounter: Payer: Self-pay | Admitting: Cardiothoracic Surgery

## 2013-07-14 ENCOUNTER — Other Ambulatory Visit: Payer: Self-pay

## 2013-07-14 ENCOUNTER — Encounter: Payer: Self-pay | Admitting: *Deleted

## 2013-07-14 ENCOUNTER — Ambulatory Visit (HOSPITAL_BASED_OUTPATIENT_CLINIC_OR_DEPARTMENT_OTHER): Payer: Medicare PPO | Admitting: Internal Medicine

## 2013-07-14 ENCOUNTER — Ambulatory Visit: Payer: Medicare PPO | Admitting: Physical Therapy

## 2013-07-14 ENCOUNTER — Ambulatory Visit (HOSPITAL_COMMUNITY)
Admission: RE | Admit: 2013-07-14 | Discharge: 2013-07-14 | Disposition: A | Discharge status: Home / Self Care | Payer: Medicare PPO | Source: Ambulatory Visit | Attending: Cardiothoracic Surgery | Admitting: Cardiothoracic Surgery

## 2013-07-14 VITALS — BP 131/64 | HR 79 | Temp 99.8°F | Resp 19 | Ht 63.0 in | Wt 104.8 lb

## 2013-07-14 DIAGNOSIS — C3490 Malignant neoplasm of unspecified part of unspecified bronchus or lung: Secondary | ICD-10-CM | POA: Insufficient documentation

## 2013-07-14 DIAGNOSIS — Z902 Acquired absence of lung [part of]: Secondary | ICD-10-CM | POA: Insufficient documentation

## 2013-07-14 DIAGNOSIS — Z9889 Other specified postprocedural states: Secondary | ICD-10-CM

## 2013-07-14 MED ORDER — CYANOCOBALAMIN 1000 MCG/ML IJ SOLN: 1000.0000 ug | Freq: Once | INTRAMUSCULAR | Status: DC

## 2013-07-14 MED ORDER — DEXAMETHASONE 4 MG PO TABS: ORAL_TABLET | ORAL | Status: DC

## 2013-07-14 MED ORDER — FOLIC ACID 1 MG PO TABS: 1.0000 mg | ORAL_TABLET | Freq: Every day | ORAL | Status: DC

## 2013-07-14 MED ORDER — OXYCODONE HCL 5 MG PO TABS: 5.0000 mg | ORAL_TABLET | ORAL | Status: DC | PRN

## 2013-07-14 NOTE — Progress Notes (Signed)
[place] Record #[id number] Date of Birth: Jul 16, 1930  Referring ZY:[id number], Aundra Millet, MD Primary Cardiology: Primary Care:Katherine Birdie Riddle, MD  Chief Complaint:  Follow Up Visit  Surgery: 06/07/2013   OPERATIVE REPORT  PREOPERATIVE DIAGNOSIS: Right upper lobe adenocarcinoma of the lung.  POSTOPERATIVE DIAGNOSIS: Right upper lobe adenocarcinoma of the lung.  SURGICAL PROCEDURES: Video bronchoscopy, right video-assisted  thoracoscopy, mini-thoracotomy, right upper lobectomy with lymph node  dissection, wedge resection of right middle lobe, placement of On-Q.  SURGEON: Lanelle Bal, MD.  Lung cancer, Right upper lobe   Primary site: Lung (Right)   Staging method: AJCC 7th Edition   Clinical: Stage IIA (T2b, N0, M0) signed by [name], MD on 05/08/2013 10:00 PM   Pathologic: Stage IIA (T2b, N0, cM0) signed by [name], MD on 06/13/2013 10:49 AM   Summary: Stage IIA (T2b, N0, cM0)  History of Present Illness:     The patient is an 80 year old female, who for several years, has had evidence of a right upper lobe lung mass that has progressively enlarged in size. Initially, this was followed by pulmonology; however, recent CT scan showed the right upper lobe mass had enlarged in size and was mildly hypermetabolic. An ENB was performed and confirmed adenocarcinoma of the lung. After consultation with radiation, the patient was offered lobectomy. She presents for routine follow up s/p right mini thoracotomy and right upper lobectomy and wedge resection of the right middle lobe on 06/07/2013. Since last seen postoperatively she can continues to improve. Returning to near-normal activities. She still has right chest wall numbness and discomfort but this is improving.   Zubrod Score: At the time of surgery this patient's most appropriate activity status/level should be described as: []     0    Normal activity, no  symptoms [x]     1    Restricted in physical strenuous activity but ambulatory, able to do out light work []     2    Ambulatory and capable of self care, unable to do work activities, up and about                 >50 % of waking hours                                                                                   []     3    Only limited self care, in bed greater than 50% of waking hours []     4    Completely disabled, no self care, confined to bed or chair []     5    Moribund  History  Smoking status  . Never Smoker   Smokeless tobacco  . Never Used       Allergies  Allergen Reactions  . Prochlorperazine Edisylate Anaphylaxis    Compazine  . Aspirin     REACTION: nose bleeds    Current Outpatient Prescriptions  Medication Sig Dispense Refill  . acetaminophen (TYLENOL) 500 MG tablet Take 1,000 mg by mouth every 6 (six) hours as needed  for mild pain.       . Biotin 1000 MCG tablet Take 1,000 mcg by mouth 2 (two) times daily.        . Calcium Carbonate-Vitamin D (CALCIUM 500 + D) 500-125 MG-UNIT TABS Take 1 tablet by mouth daily.       Marland Kitchen docusate sodium (STOOL SOFTENER) 100 MG capsule Take 100 mg by mouth 2 (two) times daily.        Marland Kitchen estradiol (ESTRACE) 0.1 MG/GM vaginal cream Place 1 Applicatorful vaginally 2 (two) times a week. On Wednesday and Sunday.      . gabapentin (NEURONTIN) 600 MG tablet Take 600 mg by mouth 3 (three) times daily.      . Glucosamine-Chondroit-Vit C-Mn (GLUCOSAMINE CHONDR 1500 COMPLX PO) Take 1 capsule by mouth daily.       . hydroxychloroquine (PLAQUENIL) 200 MG tablet Take 200 mg by mouth 2 (two) times daily.       . meloxicam (MOBIC) 15 MG tablet Take 15 mg by mouth daily.        . Multiple Vitamin (MULTIVITAMIN) tablet Take 1 tablet by mouth daily.        Marland Kitchen omeprazole (PRILOSEC) 20 MG capsule Take 1 capsule (20 mg total) by mouth 2 (two) times daily before a meal.  60 capsule  11  . ondansetron (ZOFRAN ODT) 4 MG disintegrating tablet Take 1 tablet (4  mg total) by mouth every 8 (eight) hours as needed for nausea or vomiting.  20 tablet  0  . oxyCODONE (OXY IR/ROXICODONE) 5 MG immediate release tablet Take 1 tablet (5 mg total) by mouth every 4 (four) hours as needed for severe pain.  30 tablet  0  . Polyethyl Glycol-Propyl Glycol (SYSTANE OP) Place 1 drop into both eyes 2 (two) times daily as needed (dry eyes).      . polyethylene glycol powder (GLYCOLAX/MIRALAX) powder Take 17 g by mouth 2 (two) times daily as needed.  3350 g  1  . dexamethasone (DECADRON) 4 MG tablet 4 mg by mouth twice a day the day before, day of and day after the chemotherapy every three-weeks.  30 tablet  0  . folic acid (FOLVITE) 1 MG tablet Take 1 tablet (1 mg total) by mouth daily.  30 tablet  2   No current facility-administered medications for this visit.   Facility-Administered Medications Ordered in Other Visits  Medication Dose Route Frequency Provider Last Rate Last Dose  . cyanocobalamin ((VITAMIN B-12)) injection 1,000 mcg  1,000 mcg Intramuscular Once Curt Bears, MD         Physical Exam: BP 131/64  Pulse 79  Temp(Src) 99.8 F (37.7 C) (Oral)  Resp 19  Ht 5\' 3"  (1.6 m)  Wt 104 lb 12.8 oz (47.537 kg)  BMI 18.57 kg/m2  SpO2 99%  General appearance: alert, cooperative and no distress Heart: regular rate and rhythm Lungs: left lung is clear; diminished at right apex but right base is clear Abdomen: Soft, minimal tenderness with palpation to right upper quadrant, minor distention, bowel sounds present Extremities: no edema Wound: clean and dry. Eschar removed. No signs of infection   Diagnostic Studies & Laboratory data:         Recent Radiology Findings: Dg Chest 2 View  07/14/2013   CLINICAL DATA:  Status post video-assisted thoracoscopy, right upper lobectomy.  EXAM: CHEST  2 VIEW  COMPARISON:  06/27/2013  FINDINGS: No pneumothorax. Postop changes on the right are stable. No lung consolidation or edema. No pleural  effusion.  Cardiac  silhouette is normal in size. No mediastinal or hilar masses.  Bony thorax is demineralized but grossly intact  IMPRESSION: No acute cardiopulmonary disease. Postoperative changes from the right lung surgery. No pneumothorax.   Electronically Signed   By: Lajean Manes M.D.   On: 07/14/2013 15:01      Recent Labs: Lab Results  Component Value Date   WBC 3.9* 07/07/2013   HGB 9.4* 07/07/2013   HCT 28.2* 07/07/2013   PLT 168.0 07/07/2013   GLUCOSE 108* 06/11/2013   CHOL 208* 08/13/2011   TRIG 65.0 08/13/2011   HDL 82.10 08/13/2011   LDLDIRECT 100.9 08/13/2011   LDLCALC 102* 03/07/2010   ALT 13 06/09/2013   AST 26 06/09/2013   NA 131* 06/11/2013   K 4.2 06/11/2013   CL 93* 06/11/2013   CREATININE 0.63 06/11/2013   BUN 14 06/11/2013   CO2 28 06/11/2013   TSH 4.22 08/13/2011   INR 0.89 06/02/2013      Assessment / Plan:    Doing well postoperatively, without obvious complications She's been seen in the multidisciplinary thoracic oncology clinic today, Dr. Julien Nordmann as discussed with her further treatment because of the size of her tumor including clinical trial. I plan to see her back in approximately 2 months.   [name] MD      [address] [place] [address] Office [phone]   Beeper [phone]

## 2013-07-14 NOTE — Progress Notes (Signed)
[place] Clinical Social Work  Clinical Social Work met with patient and Futures trader at [employer] appointment to offer support and assess for psychosocial needs.  Medical oncologist reviewed patient's diagnosis and recommended treatment plan with patient/family.  Patient was alone at visit today.  She stated her daughter is very supportive.  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  Patient requesting time to process treatment options.  Clinical Social Work briefly discussed Clinical Social Work role and [employer] support programs/services.  Clinical Social Work encouraged patient to call with any additional questions or concerns.   Polo Riley, MSW, LCSW, OSW-C Clinical Social Worker [facility] [phone]

## 2013-07-14 NOTE — Progress Notes (Signed)
[place] [phone]   [phone] Multidisciplinary thoracic oncology clinic ([place])  CONSULT NOTE  REFERRING PHYSICIAN: Dr. Lanelle Bal  REASON FOR CONSULTATION:  80 years old white female recently diagnosed with lung cancer  HPI [name] is a 80 y.o. female is a never smoker with a past medical history significant for rheumatoid arthritis, Sjogren's syndrome, Raynaud's phenomena, anemia, osteopenia. In 2010 and the patient change it to a rheumatologist and her new physician ordered chest x-ray for evaluation of her rheumatologic disorders. This was performed on 12/12/2008 and it showed right upper lobe density measuring approximately 1.5 x 4.0 CM suspicious for neoplasm versus an area of scar. This was followed by CT scan of the chest on the same day and it showed an irregular right upper lobe density measured 4.3 x 4.3 x 2.4 CM. A PET scan on 12/26/2008 showed low level FDG uptake associated with right upper lobe pulmonary parenchymal consolidation. The patient was seen by [name] and followed by observation. She was seen recently by her primary care physician Dr. Birdie Riddle and she was referred to Dr. Servando Snare for evaluation of the persistent opacity in the right lung. A PET scan on 04/22/2013 showed the irregular consolidation of the right lower lobe has increased in metabolic activity compared to the prior with SUV max of 1.5 compared to SUV max of 2.7 on the prior PET scan. The mass also increased in size and measured 4.2 x 2.0 CM compared to 3.4 x 1.7 CM. On 05/04/2013 she underwent bronchoscopy with electromagnetic navigational bronchoscopy and transbronchial biopsy of the right upper lobe lung lesion. The final cytology was consistent with adenocarcinoma. On 06/07/2013 the patient underwent Video bronchoscopy, right video-assisted thoracoscopy, mini-thoracotomy, right upper lobectomy with lymph node dissection, wedge resection of right middle  lobe.  The final pathology (Accession: [id number]) showed adenocarcinoma measuring 5.3 CM involving the right upper lobe and portion of the right middle lobe. The dissected lymph nodes were negative for malignancy. The tissue block was sent to [facility] one a molecular biomarker testing and it showed breast has EGFR mutation in exon 21 (L858R).  The patient is recovering well from her surgery with no significant complaints except for constipation a few days after the surgery and she took a lot of laxatives which resulted in diarrhea but this is now resolved. She also has shortness breath with exertion but no significant chest pain. She has mild nonproductive cough with no hemoptysis. She lost a total of 10 pounds during her surgery time. She denied having any significant nausea or vomiting. She denied having any significant headache or blurry vision. Family history significant for father who died at age 33 with chronic heart disease and has a history of colon cancer. Mother died at age 89 and a car accident when she was driving. The patient is married and has 2 children. Her main caregiver is her daughter [name]. The patient used to work as a [employer] fundraiser. She has no history of smoking and use to drink alcohol occasionally, with no history of drug abuse.   HPI  Past Medical History  Diagnosis Date  . Iron deficiency anemia   . Sjogren's syndrome     Dr. Abner Greenspan  . Abnormal ECG 11/2008    left bbb  . Raynaud's phenomenon 1965  . Lupus     takes Plaquenil daily  . Arthritis   . Colon polyps   . PONV (postoperative nausea and vomiting)   .  Constipation     takes Colace daily  . GERD (gastroesophageal reflux disease)     takes Omeprazole daily  . Dry eyes     eye drops used   . Joint swelling   . Rheumatoid arthritis   . Back pain     scoliosis  . Osteoporosis   . Sciatica     Past Surgical History  Procedure Laterality Date  . Appendectomy    . Tonsillectomy    .  Total abdominal hysterectomy  1980's   . Thoracic sympathetectomy  1965  . Cataract extraction Bilateral   . Colonoscopy    . Cardiovascular stress test      12/2008 mild fixed basal to mid septal perfusion defect felt likely due to artifact from LBBB, no ischemia, EF 58%  . Video bronchoscopy with endobronchial navigation N/A 05/04/2013    Procedure: VIDEO BRONCHOSCOPY WITH ENDOBRONCHIAL NAVIGATION;  Surgeon: Delight Ovens, MD;  Location: [facility] OR;  Service: Thoracic;  Laterality: N/A;  . Video bronchoscopy N/A 06/07/2013    Procedure: VIDEO BRONCHOSCOPY;  Surgeon: Delight Ovens, MD;  Location: [facility] OR;  Service: Thoracic;  Laterality: N/A;  . Video assisted thoracoscopy (vats)/wedge resection Right 06/07/2013    Procedure: VIDEO ASSISTED THORACOSCOPY (VATS)/right upper lobectomy, On Q;  Surgeon: Delight Ovens, MD;  Location: [facility] OR;  Service: Thoracic;  Laterality: Right;  . Thoracotomy  06/07/2013    Procedure: MINI/LIMITED THORACOTOMY; right middle lobe wedge resection;  Surgeon: Delight Ovens, MD;  Location: [facility] OR;  Service: Thoracic;;  . Lymph node dissection Right 06/07/2013    Procedure: LYMPH NODE DISSECTION;  Surgeon: Delight Ovens, MD;  Location: [facility] OR;  Service: Thoracic;  Laterality: Right;    Family History  Problem Relation Age of Onset  . Coronary artery disease Father   . Colon cancer Father   . Diabetes Father   . Esophageal cancer Neg Hx   . Kidney disease Neg Hx   . Liver disease Neg Hx     Social History History  Substance Use Topics  . Smoking status: Never Smoker   . Smokeless tobacco: Never Used  . Alcohol Use: Yes     Comment: rare/social    Allergies  Allergen Reactions  . Prochlorperazine Edisylate Anaphylaxis    Compazine  . Aspirin     REACTION: nose bleeds    Current Outpatient Prescriptions  Medication Sig Dispense Refill  . acetaminophen (TYLENOL) 500 MG tablet Take 1,000 mg by mouth every 6 (six) hours as needed for mild pain.        . Biotin 1000 MCG tablet Take 1,000 mcg by mouth 2 (two) times daily.        . Calcium Carbonate-Vitamin D (CALCIUM 500 + D) 500-125 MG-UNIT TABS Take 1 tablet by mouth daily.       Marland Kitchen docusate sodium (STOOL SOFTENER) 100 MG capsule Take 100 mg by mouth 2 (two) times daily.        Marland Kitchen estradiol (ESTRACE) 0.1 MG/GM vaginal cream Place 1 Applicatorful vaginally 2 (two) times a week. On Wednesday and Sunday.      . gabapentin (NEURONTIN) 600 MG tablet Take 600 mg by mouth 3 (three) times daily.      . Glucosamine-Chondroit-Vit C-Mn (GLUCOSAMINE CHONDR 1500 COMPLX PO) Take 1 capsule by mouth daily.       . hydroxychloroquine (PLAQUENIL) 200 MG tablet Take 200 mg by mouth 2 (two) times daily.       . meloxicam (MOBIC)  15 MG tablet Take 15 mg by mouth daily.        . Multiple Vitamin (MULTIVITAMIN) tablet Take 1 tablet by mouth daily.        Marland Kitchen omeprazole (PRILOSEC) 20 MG capsule Take 1 capsule (20 mg total) by mouth 2 (two) times daily before a meal.  60 capsule  11  . ondansetron (ZOFRAN ODT) 4 MG disintegrating tablet Take 1 tablet (4 mg total) by mouth every 8 (eight) hours as needed for nausea or vomiting.  20 tablet  0  . oxyCODONE (OXY IR/ROXICODONE) 5 MG immediate release tablet Take 5 mg by mouth every 4 (four) hours as needed for severe pain.      Bertram Gala Glycol-Propyl Glycol (SYSTANE OP) Place 1 drop into both eyes 2 (two) times daily as needed (dry eyes).      . polyethylene glycol powder (GLYCOLAX/MIRALAX) powder Take 17 g by mouth 2 (two) times daily as needed.  3350 g  1  . dexamethasone (DECADRON) 4 MG tablet 4 mg by mouth twice a day the day before, day of and day after the chemotherapy every three-weeks.  30 tablet  0  . folic acid (FOLVITE) 1 MG tablet Take 1 tablet (1 mg total) by mouth daily.  30 tablet  2   Current Facility-Administered Medications  Medication Dose Route Frequency Provider Last Rate Last Dose  . cyanocobalamin ((VITAMIN B-12)) injection 1,000 mcg  1,000 mcg  Intramuscular Once Si Gaul, MD        Review of Systems  Constitutional: negative Eyes: negative Ears, nose, mouth, throat, and face: negative Respiratory: positive for cough and dyspnea on exertion Cardiovascular: negative Gastrointestinal: negative Genitourinary:negative Integument/breast: negative Hematologic/lymphatic: negative Musculoskeletal:negative Neurological: negative Behavioral/Psych: negative Endocrine: negative Allergic/Immunologic: negative  Physical Exam  ION:GEXBM, healthy, no distress, well nourished and well developed SKIN: skin color, texture, turgor are normal, no rashes or significant lesions HEAD: Normocephalic, No masses, lesions, tenderness or abnormalities EYES: normal, PERRLA EARS: External ears normal, Canals clear OROPHARYNX:no exudate, no erythema and lips, buccal mucosa, and tongue normal  NECK: supple, no adenopathy, no JVD LYMPH:  no palpable lymphadenopathy, no hepatosplenomegaly BREAST:not examined LUNGS: clear to auscultation , and palpation HEART: regular rate & rhythm, no murmurs and no gallops ABDOMEN:abdomen soft, non-tender, normal bowel sounds and no masses or organomegaly BACK: Back symmetric, no curvature., No CVA tenderness EXTREMITIES:no joint deformities, effusion, or inflammation, no edema, no skin discoloration, no clubbing  NEURO: alert & oriented x 3 with fluent speech, no focal motor/sensory deficits  PERFORMANCE STATUS: ECOG 1  LABORATORY DATA: Lab Results  Component Value Date   WBC 3.9* 07/07/2013   HGB 9.4* 07/07/2013   HCT 28.2* 07/07/2013   MCV 87.6 07/07/2013   PLT 168.0 07/07/2013      Chemistry      Component Value Date/Time   NA 131* 06/11/2013 0530   K 4.2 06/11/2013 0530   CL 93* 06/11/2013 0530   CO2 28 06/11/2013 0530   BUN 14 06/11/2013 0530   CREATININE 0.63 06/11/2013 0530      Component Value Date/Time   CALCIUM 8.9 06/11/2013 0530   ALKPHOS 45 06/09/2013 0359   AST 26 06/09/2013 0359   ALT  13 06/09/2013 0359   BILITOT 0.3 06/09/2013 0359       RADIOGRAPHIC STUDIES: Dg Chest 2 View  07/14/2013   CLINICAL DATA:  Status post video-assisted thoracoscopy, right upper lobectomy.  EXAM: CHEST  2 VIEW  COMPARISON:  06/27/2013  FINDINGS: No  pneumothorax. Postop changes on the right are stable. No lung consolidation or edema. No pleural effusion.  Cardiac silhouette is normal in size. No mediastinal or hilar masses.  Bony thorax is demineralized but grossly intact  IMPRESSION: No acute cardiopulmonary disease. Postoperative changes from the right lung surgery. No pneumothorax.   Electronically Signed   By: Amie Portland M.D.   On: 07/14/2013 15:01   Dg Chest 2 View  06/27/2013   CLINICAL DATA:  Chest pain with known malignant neoplasm status post surgery April 21st now with anterior right chest pain  EXAM: CHEST  2 VIEW  COMPARISON:  DG CHEST 2 VIEW dated 06/13/2013  FINDINGS: The pneumothorax on the right is again demonstrated. It has not significantly increased in size. The pleural line lies between the third and fourth posterior ribs on the right. There is a new meniscus visible consistent with the presence of pleural fluid in a subpulmonic location however. The left lung is well expanded and clear. There is no shift of the midline. The cardiac silhouette is normal in size. There is soft tissue prominence of the right hilar region which is stable. The observed portions of the bony thorax exhibit no acute abnormalities but there is severe dextroscoliosis of the lumbar spine.  IMPRESSION: There is a persistent pneumothorax on the right not significantly increased in size since the previous study. However, fluid is now clearly visible in the subpulmonic space consistent with a hydropneumothorax. No mediastinal shift has developed. The left lung is clear.  These results were called by telephone at the time of interpretation on 06/27/2013 at 1:43 PM to Darius Bump, RN,, who verbally acknowledged these  results.   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 The patient is status post right upper lobectomy with wedge resection of the right middle lobe.   PLAN: I had a lengthy discussion with the patient about her disease stage, prognosis and treatment options.  I will complete the staging workup by order an MRI of the brain to rule out brain metastasis. I explained to the patient that there is an overall 5 year survival benefit for adjuvant chemotherapy for patient with a stage IIA non-small cell lung cancer but the benefit is in the range of 5-10%. I recommended for the patient and adjuvant treatment with platinum-based chemotherapy and because of her age and comorbidities I would consider her for treatment with carboplatin for AUC of 5 and Alimta 500 mg/M2 every 3 weeks for 4 cycles. I discussed with the patient adverse effect of the chemotherapy including but not limited to alopecia, myelosuppression, nausea and vomiting, peripheral neuropathy, liver or renal dysfunction. The patient would like to think about this option and discuss it with her family before committing to the adjuvant chemotherapy. I gave the patient a tentative date for starting her treatment on 07/25/2013. She will receive vitamin B12 injection and chemotherapy education class before starting the first cycle of her treatment. I also Escribed Decadron 4 mg by mouth twice a day the day before, day of and day after the chemotherapy in addition to folic acid 1 mg by mouth daily. The patient has nausea medicine in the form of Zofran at home. She  was also seen by the clinical research nurse today for evaluation and consideration of enrollment in  the [employer] [facility] [phone] clinical trial for testing for EGFR mutation as well as ALK gene translocation followed by randomization to treatment with interleukin at versus placebo as the patient has positive EGFR mutation which is the case for this patient after completion of the adjuvant chemotherapy. I would see her back for followup visit in 3 weeks for evaluation and management any adverse effect of her treatment. She was advised to call immediately if she has any concerning symptoms in the interval. The patient voices understanding of current disease status and treatment options and is in agreement with the current care plan. The patient was seen during her visit to the multidisciplinary thoracic oncology clinic today by medical oncology, thoracic surgeon, thoracic navigator, social worker as well as clinical research nurse. All questions were answered. The patient knows to call the clinic with any problems, questions or concerns. We can certainly see the patient much sooner if necessary.  Thank you so much for allowing me to participate in the care of [place]. I will continue to follow up the patient with you and assist in her care.  I spent 55 minutes counseling the patient face to face. The total time spent in the appointment was 80 minutes.  Disclaimer: This note was dictated with voice recognition software. Similar sounding words can inadvertently be transcribed and may not be corrected upon review.   Curt Bears 07/14/2013, 4:07 PM

## 2013-07-19 ENCOUNTER — Ambulatory Visit: Payer: Medicare PPO

## 2013-07-19 ENCOUNTER — Telehealth: Payer: Self-pay | Admitting: Medical Oncology

## 2013-07-19 ENCOUNTER — Other Ambulatory Visit: Payer: Medicare PPO

## 2013-07-19 ENCOUNTER — Other Ambulatory Visit: Payer: Self-pay | Admitting: Medical Oncology

## 2013-07-19 ENCOUNTER — Other Ambulatory Visit: Payer: Self-pay | Admitting: Internal Medicine

## 2013-07-19 DIAGNOSIS — C3410 Malignant neoplasm of upper lobe, unspecified bronchus or lung: Secondary | ICD-10-CM

## 2013-07-19 NOTE — Telephone Encounter (Signed)
Pt is not sure she is going to take chemotherapy.She is going to meet with mary on friday and pts daughter will also be present. I forwarded message to [facility] and informed education nurse. Pt cancelled education and injection appt today .

## 2013-07-22 ENCOUNTER — Encounter: Payer: Self-pay | Admitting: *Deleted

## 2013-07-25 ENCOUNTER — Ambulatory Visit: Payer: Medicare PPO

## 2013-07-25 ENCOUNTER — Other Ambulatory Visit: Payer: Medicare PPO

## 2013-07-26 ENCOUNTER — Telehealth: Payer: Self-pay | Admitting: *Deleted

## 2013-07-26 NOTE — Telephone Encounter (Signed)
Called to check on pt.  She missed chemo education and infusion.  She does not want any treatment right now.  She would like me to wait and check on her after MRI Brain.  I said I would.

## 2013-07-27 ENCOUNTER — Other Ambulatory Visit: Payer: Self-pay | Admitting: *Deleted

## 2013-07-27 ENCOUNTER — Telehealth: Payer: Self-pay | Admitting: Internal Medicine

## 2013-07-27 NOTE — Telephone Encounter (Signed)
, °

## 2013-08-01 ENCOUNTER — Ambulatory Visit: Payer: Medicare PPO | Admitting: Nurse Practitioner

## 2013-08-01 ENCOUNTER — Ambulatory Visit (HOSPITAL_COMMUNITY)
Admission: RE | Admit: 2013-08-01 | Discharge: 2013-08-01 | Disposition: A | Discharge status: Home / Self Care | Payer: Medicare PPO | Source: Ambulatory Visit | Attending: Internal Medicine | Admitting: Internal Medicine

## 2013-08-01 ENCOUNTER — Other Ambulatory Visit: Payer: Medicare PPO

## 2013-08-01 DIAGNOSIS — C3410 Malignant neoplasm of upper lobe, unspecified bronchus or lung: Secondary | ICD-10-CM

## 2013-08-01 LAB — POCT I-STAT CREATININE: CREATININE: 1.2 mg/dL — AB (ref 0.50–1.10)

## 2013-08-01 MED ORDER — GADOBENATE DIMEGLUMINE 529 MG/ML IV SOLN
10.0000 mL | Freq: Once | INTRAVENOUS | Status: AC | PRN
  Administered 2013-08-01: 10 mL via INTRAVENOUS

## 2013-08-02 ENCOUNTER — Other Ambulatory Visit (HOSPITAL_BASED_OUTPATIENT_CLINIC_OR_DEPARTMENT_OTHER): Payer: Medicare PPO

## 2013-08-02 ENCOUNTER — Encounter: Payer: Self-pay | Admitting: Internal Medicine

## 2013-08-02 ENCOUNTER — Ambulatory Visit (HOSPITAL_BASED_OUTPATIENT_CLINIC_OR_DEPARTMENT_OTHER): Payer: Medicare PPO | Admitting: Internal Medicine

## 2013-08-02 VITALS — BP 132/53 | HR 80 | Temp 98.4°F | Resp 18 | Ht 63.0 in | Wt 103.6 lb

## 2013-08-02 DIAGNOSIS — C3490 Malignant neoplasm of unspecified part of unspecified bronchus or lung: Secondary | ICD-10-CM

## 2013-08-02 DIAGNOSIS — C3410 Malignant neoplasm of upper lobe, unspecified bronchus or lung: Secondary | ICD-10-CM

## 2013-08-02 LAB — CBC WITH DIFFERENTIAL/PLATELET
BASO%: 0.9 % (ref 0.0–2.0)
Basophils Absolute: 0 10*3/uL (ref 0.0–0.1)
EOS ABS: 0.1 10*3/uL (ref 0.0–0.5)
EOS%: 2.6 % (ref 0.0–7.0)
HCT: 32 % — ABNORMAL LOW (ref 34.8–46.6)
LYMPH#: 1.3 10*3/uL (ref 0.9–3.3)
LYMPH%: 28.1 % (ref 14.0–49.7)
MCH: 28.1 pg (ref 25.1–34.0)
MCHC: 32.2 g/dL (ref 31.5–36.0)
MCV: 87.2 fL (ref 79.5–101.0)
MONO#: 0.5 10*3/uL (ref 0.1–0.9)
MONO%: 11 % (ref 0.0–14.0)
NEUT%: 57.4 % (ref 38.4–76.8)
NEUTROS ABS: 2.6 10*3/uL (ref 1.5–6.5)
Platelets: 169 10*3/uL (ref 145–400)
RBC: 3.67 10*6/uL — ABNORMAL LOW (ref 3.70–5.45)
RDW: 13.8 % (ref 11.2–14.5)
WBC: 4.6 10*3/uL (ref 3.9–10.3)
nRBC: 0 % (ref 0–0)

## 2013-08-02 LAB — COMPREHENSIVE METABOLIC PANEL (CC13)
ALBUMIN: 3.6 g/dL (ref 3.5–5.0)
ALK PHOS: 100 U/L (ref 40–150)
ALT: 23 U/L (ref 0–55)
AST: 33 U/L (ref 5–34)
Anion Gap: 7 mEq/L (ref 3–11)
BUN: 26.6 mg/dL — ABNORMAL HIGH (ref 7.0–26.0)
CO2: 28 meq/L (ref 22–29)
Calcium: 9 mg/dL (ref 8.4–10.4)
Chloride: 102 mEq/L (ref 98–109)
Creatinine: 1 mg/dL (ref 0.6–1.1)
GLUCOSE: 99 mg/dL (ref 70–140)
POTASSIUM: 4.7 meq/L (ref 3.5–5.1)
SODIUM: 137 meq/L (ref 136–145)
Total Bilirubin: 0.27 mg/dL (ref 0.20–1.20)
Total Protein: 6.9 g/dL (ref 6.4–8.3)

## 2013-08-02 NOTE — Progress Notes (Signed)
[place] [phone]   [phone]  OFFICE PROGRESS NOTE  [name], MD [phone] [place] [address]  DIAGNOSIS: Stage IIA (T2b, N0, M0) non-small cell lung cancer, adenocarcinoma with positive EGFR mutation in exon 21 ([id number]) presented with right upper lobe lung mass diagnosed in March of 2015.  PRIOR THERAPY: Status post right upper lobectomy with wedge resection of the right middle lobe under the care of Dr. Servando Snare on 06/07/2013  CURRENT THERAPY: The patient is currently evaluated for clinical trial for adjuvant treatment with target biologic treatment.  INTERVAL HISTORY: [name] 80 y.o. female returns to the clinic today for followup visit accompanied by her daughter [name]. The patient is feeling fine today with no specific complaints. She recently had MRI of the brain that showed no evidence for metastatic disease to the brain. She denied having any significant weakness or fatigue. She has no nausea or vomiting. She has no fever or chills. She continues to complain of mild pain on the right side of the chest after the surgical resection but no significant shortness of breath, cough or hemoptysis. She was evaluated for adjuvant treatment for her recently diagnosed lung cancer.   MEDICAL HISTORY: Past Medical History  Diagnosis Date  . Iron deficiency anemia   . Sjogren's syndrome     Dr. Abner Greenspan  . Abnormal ECG 11/2008    left bbb  . Raynaud's phenomenon 1965  . Lupus     takes Plaquenil daily  . Arthritis   . Colon polyps   . PONV (postoperative nausea and vomiting)   . Constipation     takes Colace daily  . GERD (gastroesophageal reflux disease)     takes Omeprazole daily  . Dry eyes     eye drops used   . Joint swelling   . Rheumatoid arthritis   . Back pain     scoliosis  . Osteoporosis   . Sciatica     ALLERGIES:  is allergic to prochlorperazine edisylate and aspirin.  MEDICATIONS:  Current  Outpatient Prescriptions  Medication Sig Dispense Refill  . acetaminophen (TYLENOL) 500 MG tablet Take 1,000 mg by mouth every 6 (six) hours as needed for mild pain.       . Biotin 1000 MCG tablet Take 1,000 mcg by mouth 2 (two) times daily.        . Calcium Carbonate-Vitamin D (CALCIUM 500 + D) 500-125 MG-UNIT TABS Take 1 tablet by mouth daily.       Marland Kitchen dexamethasone (DECADRON) 4 MG tablet 4 mg by mouth twice a day the day before, day of and day after the chemotherapy every three-weeks.  30 tablet  0  . docusate sodium (STOOL SOFTENER) 100 MG capsule Take 100 mg by mouth 2 (two) times daily.        Marland Kitchen estradiol (ESTRACE) 0.1 MG/GM vaginal cream Place 1 Applicatorful vaginally 2 (two) times a week. On Wednesday and Sunday.      . folic acid (FOLVITE) 1 MG tablet Take 1 tablet (1 mg total) by mouth daily.  30 tablet  2  . gabapentin (NEURONTIN) 600 MG tablet Take 600 mg by mouth 3 (three) times daily.      . Glucosamine-Chondroit-Vit C-Mn (GLUCOSAMINE CHONDR 1500 COMPLX PO) Take 1 capsule by mouth daily.       . hydroxychloroquine (PLAQUENIL) 200 MG tablet Take 200 mg by mouth 2 (two) times daily.       Marland Kitchen  meloxicam (MOBIC) 15 MG tablet Take 15 mg by mouth daily.        . Multiple Vitamin (MULTIVITAMIN) tablet Take 1 tablet by mouth daily.        Marland Kitchen omeprazole (PRILOSEC) 20 MG capsule Take 1 capsule (20 mg total) by mouth 2 (two) times daily before a meal.  60 capsule  11  . ondansetron (ZOFRAN ODT) 4 MG disintegrating tablet Take 1 tablet (4 mg total) by mouth every 8 (eight) hours as needed for nausea or vomiting.  20 tablet  0  . oxyCODONE (OXY IR/ROXICODONE) 5 MG immediate release tablet Take 1 tablet (5 mg total) by mouth every 4 (four) hours as needed for severe pain.  30 tablet  0  . Polyethyl Glycol-Propyl Glycol (SYSTANE OP) Place 1 drop into both eyes 2 (two) times daily as needed (dry eyes).      . polyethylene glycol powder (GLYCOLAX/MIRALAX) powder Take 17 g by mouth 2 (two) times daily as  needed.  3350 g  1   No current facility-administered medications for this visit.    SURGICAL HISTORY:  Past Surgical History  Procedure Laterality Date  . Appendectomy    . Tonsillectomy    . Total abdominal hysterectomy  1980's   . Thoracic sympathetectomy  1965  . Cataract extraction Bilateral   . Colonoscopy    . Cardiovascular stress test      12/2008 mild fixed basal to mid septal perfusion defect felt likely due to artifact from LBBB, no ischemia, EF 58%  . Video bronchoscopy with endobronchial navigation N/A 05/04/2013    Procedure: VIDEO BRONCHOSCOPY WITH ENDOBRONCHIAL NAVIGATION;  Surgeon: [name], MD;  Location: [facility];  Service: Thoracic;  Laterality: N/A;  . Video bronchoscopy N/A 06/07/2013    Procedure: VIDEO BRONCHOSCOPY;  Surgeon: [name], MD;  Location: [facility] OR;  Service: Thoracic;  Laterality: N/A;  . Video assisted thoracoscopy (vats)/wedge resection Right 06/07/2013    Procedure: VIDEO ASSISTED THORACOSCOPY (VATS)/right upper lobectomy, On Q;  Surgeon: [name], MD;  Location: [facility];  Service: Thoracic;  Laterality: Right;  . Thoracotomy  06/07/2013    Procedure: MINI/LIMITED THORACOTOMY; right middle lobe wedge resection;  Surgeon: [name], MD;  Location: [facility] OR;  Service: Thoracic;;  . Lymph node dissection Right 06/07/2013    Procedure: LYMPH NODE DISSECTION;  Surgeon: [name], MD;  Location: [facility];  Service: Thoracic;  Laterality: Right;    REVIEW OF SYSTEMS:  Constitutional: negative Eyes: negative Ears, nose, mouth, throat, and face: negative Respiratory: positive for pleurisy/chest pain Cardiovascular: negative Gastrointestinal: negative Genitourinary:negative Integument/breast: negative Hematologic/lymphatic: negative Musculoskeletal:negative Neurological: negative Behavioral/Psych: negative Endocrine: negative Allergic/Immunologic: negative   PHYSICAL EXAMINATION: General appearance: alert, cooperative  and no distress Head: Normocephalic, without obvious abnormality, atraumatic Neck: no adenopathy, no JVD, supple, symmetrical, trachea midline and thyroid not enlarged, symmetric, no tenderness/mass/nodules Lymph nodes: Cervical, supraclavicular, and axillary nodes normal. Resp: clear to auscultation bilaterally Back: symmetric, no curvature. ROM normal. No CVA tenderness. Cardio: regular rate and rhythm, S1, S2 normal, no murmur, click, rub or gallop GI: soft, non-tender; bowel sounds normal; no masses,  no organomegaly Extremities: extremities normal, atraumatic, no cyanosis or edema Neurologic: Alert and oriented X 3, normal strength and tone. Normal symmetric reflexes. Normal coordination and gait  ECOG PERFORMANCE STATUS: 1 - Symptomatic but completely ambulatory  Blood pressure 132/53, pulse 80, temperature 98.4 F (36.9 C), temperature source Oral, resp. rate 18, height $RemoveBe'5\' 3"'CJdWgWEtJ$  (1.6 m), weight  103 lb 9.6 oz (46.993 kg), SpO2 98.00%.  LABORATORY DATA: Lab Results  Component Value Date   WBC 4.6 08/02/2013   HGB 10.3 Repeated and Verified* 08/02/2013   HCT 32.0* 08/02/2013   MCV 87.2 08/02/2013   PLT 169 08/02/2013      Chemistry      Component Value Date/Time   NA 137 08/02/2013 1021   NA 131* 06/11/2013 0530   K 4.7 08/02/2013 1021   K 4.2 06/11/2013 0530   CL 93* 06/11/2013 0530   CO2 28 08/02/2013 1021   CO2 28 06/11/2013 0530   BUN 26.6* 08/02/2013 1021   BUN 14 06/11/2013 0530   CREATININE 1.0 08/02/2013 1021   CREATININE 1.20* 08/01/2013 1052      Component Value Date/Time   CALCIUM 9.0 08/02/2013 1021   CALCIUM 8.9 06/11/2013 0530   ALKPHOS 100 08/02/2013 1021   ALKPHOS 45 06/09/2013 0359   AST 33 08/02/2013 1021   AST 26 06/09/2013 0359   ALT 23 08/02/2013 1021   ALT 13 06/09/2013 0359   BILITOT 0.27 08/02/2013 1021   BILITOT 0.3 06/09/2013 0359       RADIOGRAPHIC STUDIES: Dg Chest 2 View  07/14/2013   CLINICAL DATA:  Status post video-assisted thoracoscopy, right upper  lobectomy.  EXAM: CHEST  2 VIEW  COMPARISON:  06/27/2013  FINDINGS: No pneumothorax. Postop changes on the right are stable. No lung consolidation or edema. No pleural effusion.  Cardiac silhouette is normal in size. No mediastinal or hilar masses.  Bony thorax is demineralized but grossly intact  IMPRESSION: No acute cardiopulmonary disease. Postoperative changes from the right lung surgery. No pneumothorax.   Electronically Signed   By: Lajean Manes M.D.   On: 07/14/2013 15:01   Mr Jeri Cos MV Contrast  08/01/2013   CLINICAL DATA:  80 year old female with lung mass. Staging. Subsequent encounter.  EXAM: MRI HEAD WITHOUT AND WITH CONTRAST  TECHNIQUE: Multiplanar, multiecho pulse sequences of the brain and surrounding structures were obtained without and with intravenous contrast.  CONTRAST:  63mL MULTIHANCE GADOBENATE DIMEGLUMINE 529 MG/ML IV SOLN  COMPARISON:  None.  FINDINGS: No restricted diffusion to suggest acute infarction. No midline shift, mass effect, evidence of mass lesion, ventriculomegaly, extra-axial collection or acute intracranial hemorrhage. Cervicomedullary junction and pituitary are within normal limits. No abnormal enhancement identified.  Grossly negative visualized cervical spine. Normal bone marrow signal. Major intracranial vascular flow voids are preserved. Pearline Cables and white matter signal is within normal limits for age throughout the brain. Visible internal auditory structures appear normal.  Visualized paranasal sinuses and mastoids are clear. Postoperative changes to the globes. Visualized scalp soft tissues are within normal limits.  IMPRESSION: No acute or metastatic intracranial abnormality.  Normal for age MRI appearance of the brain.   Electronically Signed   By: Lars Pinks M.D.   On: 08/01/2013 12:37    ASSESSMENT AND PLAN: This is a very pleasant 80 years old white female recently diagnosed with a stage II a non-small cell lung cancer, adenocarcinoma with positive EGFR mutation  in exon 21. Her recent MRI of the brain showed no evidence for brain metastasis. I have a lengthy discussion with the patient and her daughter today about her current disease status and treatment options. I discussed with her again seen option of adjuvant chemotherapy with carboplatin and Alimta for 4 cycles versus consideration of enrollment in the adjuvant trial [facility] [phone] 4 marker identification and sequencing followed by the Alliance [id number] per the patient  would be randomized to treatment with interleukin and versus placebo. The patient is not interested in proceeding with adjuvant chemotherapy but would like to consider the adjuvant trial according to the Alliance protocols.  She signed the consent for the Alliance [phone] and she is also interested on the second Alliance protocol [id number] if eligible. She will be contacted by the clinical research nurse once the final by marker studies become available. She would have a followup appointment with me based on the protocol requirement. I gave the patient and her daughter the time to ask questions and I answered them completely to their satisfaction. The patient voices understanding of current disease status and treatment options and is in agreement with the current care plan.  All questions were answered. The patient knows to call the clinic with any problems, questions or concerns. We can certainly see the patient much sooner if necessary.  I spent 15 minutes counseling the patient face to face. The total time spent in the appointment was 25 minutes.  Disclaimer: This note was dictated with voice recognition software. Similar sounding words can inadvertently be transcribed and may not be corrected upon review.

## 2013-08-05 ENCOUNTER — Encounter: Payer: Self-pay | Admitting: *Deleted

## 2013-08-05 ENCOUNTER — Other Ambulatory Visit: Payer: Self-pay | Admitting: *Deleted

## 2013-08-05 ENCOUNTER — Telehealth: Payer: Self-pay | Admitting: Internal Medicine

## 2013-08-05 DIAGNOSIS — Z85118 Personal history of other malignant neoplasm of bronchus and lung: Secondary | ICD-10-CM

## 2013-08-05 NOTE — Telephone Encounter (Signed)
per pof to sch fin asst-cld & spoke to pt /pt stated do not need FA because she has health ins-adv i would note the ac

## 2013-08-08 ENCOUNTER — Other Ambulatory Visit: Payer: Medicare PPO

## 2013-08-08 DIAGNOSIS — Z85118 Personal history of other malignant neoplasm of bronchus and lung: Secondary | ICD-10-CM

## 2013-08-08 LAB — RESEARCH LABS

## 2013-08-09 ENCOUNTER — Encounter: Payer: Self-pay | Admitting: *Deleted

## 2013-08-09 NOTE — Progress Notes (Signed)
08/09/13 9:30 Received a phone call from [name]. She signed consent yesterday for [id number], erlotinib or placebo following surgery for early stage  lung cancer. She called to tell me she has decided not to proceed with participation in this study. She is concerned about the possibility of adding side effects when she is already managing the side effects of irritable bowel and other ongoing medical problems.  She has asked me to cancel her cancel the CT scan scheduled this week as part of her eligibility review. This has been done.  Will communicate this information to Dr. Julien Nordmann and his nurses. She does not have any appointments scheduled with medical oncology.

## 2013-08-10 ENCOUNTER — Other Ambulatory Visit: Payer: Self-pay | Admitting: Internal Medicine

## 2013-08-10 DIAGNOSIS — C3411 Malignant neoplasm of upper lobe, right bronchus or lung: Secondary | ICD-10-CM

## 2013-08-11 ENCOUNTER — Ambulatory Visit (HOSPITAL_COMMUNITY): Payer: Medicare PPO

## 2013-08-11 ENCOUNTER — Telehealth: Payer: Self-pay | Admitting: *Deleted

## 2013-08-11 ENCOUNTER — Telehealth: Payer: Self-pay | Admitting: Internal Medicine

## 2013-08-11 NOTE — Telephone Encounter (Signed)
Message copied by Britt Bottom on Thu Aug 11, 2013  9:14 AM ------      Message from: Curt Bears      Created: Wed Aug 10, 2013  5:01 PM      Regarding: RE: Patient withdrew consent for Clinical Trial       Follow up in 6 months, Orders done.      ----- Message -----         From: Hoy Register, RN         Sent: 08/10/2013   1:32 PM           To: Anders Grant, RN, #      Subject: Patient withdrew consent for Clinical Trial              [name] signed consent for the trial with Erlotinib or Placebo. She called the next day and withdrew her consent to participate in that trial.             She is still enrolled in the ALCHEMIST trial but does not require any appointments for that trial.            Bottom line, since I am not entering visits for the erlotinib/placebo trial, she does not have any follow up appointments scheduled with medical Oncology       ------

## 2013-08-11 NOTE — Telephone Encounter (Signed)
error 

## 2013-08-11 NOTE — Telephone Encounter (Signed)
s.w. pt and advised on Dec appt....pt ok and awre

## 2013-08-15 ENCOUNTER — Other Ambulatory Visit: Payer: Medicare PPO

## 2013-08-15 ENCOUNTER — Ambulatory Visit: Payer: Medicare PPO

## 2013-08-22 ENCOUNTER — Encounter: Payer: Self-pay | Admitting: *Deleted

## 2013-08-22 NOTE — Progress Notes (Signed)
[place] Psychosocial Distress Screening Clinical Social Work  Clinical Social Work was referred by distress screening protocol.  The patient scored a 7 on the Psychosocial Distress Thermometer which indicates moderate distress. Clinical Social Worker attempted to contact patient by phone to assess for distress and other psychosocial needs. CSW met with patient at [facility], left voicemail for patient to return call when convenient.  ONCBCN DISTRESS SCREENING 08/02/2013  Screening Type Initial Screening  Mark the number that describes how much distress you have been experiencing in the past week 7  Practical problem type (No Data)  Emotional problem type Adjusting to illness  Information Concerns Type Lack of info about complementary therapy choices  Physical Problem type Tingling hands/feet;Constipation/diarrhea;Sleep/insomnia;Pain  Physician notified of physical symptoms Yes  Other scoliosis pain    Clinical Social Worker follow up needed: No.  If yes, follow up plan:   Polo Riley, MSW, LCSW, OSW-C Clinical Social Worker [facility] [phone]

## 2013-09-06 ENCOUNTER — Other Ambulatory Visit: Payer: Self-pay | Admitting: Cardiothoracic Surgery

## 2013-09-06 DIAGNOSIS — C3410 Malignant neoplasm of upper lobe, unspecified bronchus or lung: Secondary | ICD-10-CM

## 2013-09-08 ENCOUNTER — Encounter: Payer: Self-pay | Admitting: Cardiothoracic Surgery

## 2013-09-08 ENCOUNTER — Ambulatory Visit (INDEPENDENT_AMBULATORY_CARE_PROVIDER_SITE_OTHER): Payer: Medicare PPO | Admitting: Cardiothoracic Surgery

## 2013-09-08 ENCOUNTER — Ambulatory Visit
Admission: RE | Admit: 2013-09-08 | Discharge: 2013-09-08 | Disposition: A | Discharge status: Home / Self Care | Payer: Medicare PPO | Source: Ambulatory Visit | Attending: Cardiothoracic Surgery | Admitting: Cardiothoracic Surgery

## 2013-09-08 VITALS — BP 125/72 | HR 82 | Resp 20 | Ht 62.0 in | Wt 103.0 lb

## 2013-09-08 DIAGNOSIS — C3410 Malignant neoplasm of upper lobe, unspecified bronchus or lung: Secondary | ICD-10-CM

## 2013-09-08 NOTE — Progress Notes (Signed)
[facility] Record #[id number] Date of Birth: [birth year]  Referring EE:[id number], Aundra Millet, MD Primary Cardiology: Primary Care:Katherine Birdie Riddle, MD  Chief Complaint:  Follow Up Visit  Surgery: 06/07/2013   OPERATIVE REPORT  PREOPERATIVE DIAGNOSIS: Right upper lobe adenocarcinoma of the lung.  POSTOPERATIVE DIAGNOSIS: Right upper lobe adenocarcinoma of the lung.  SURGICAL PROCEDURES: Video bronchoscopy, right video-assisted  thoracoscopy, mini-thoracotomy, right upper lobectomy with lymph node  dissection, wedge resection of right middle lobe, placement of On-Q.  SURGEON: Lanelle Bal, MD.  Lung cancer, Right upper lobe   Primary site: Lung (Right)   Staging method: AJCC 7th Edition   Clinical: Stage IIA (T2b, N0, M0) signed by [name], MD on 05/08/2013 10:00 PM   Pathologic: Stage IIA (T2b, N0, cM0) signed by [name], MD on 06/13/2013 10:49 AM   Summary: Stage IIA (T2b, N0, cM0)  History of Present Illness:     The patient is an 80 year old female who  presents for routine follow up s/p right mini thoracotomy and right upper lobectomy and wedge resection of the right middle lobe on 06/07/2013. Since last seen postoperatively she can continues to improve. Returning to near-normal activities. Her rheumatoid arthritis is limiting her overall activity.  Zubrod Score: At the time of surgery this patient's most appropriate activity status/level should be described as: []     0    Normal activity, no symptoms [x]     1    Restricted in physical strenuous activity but ambulatory, able to do out light work []     2    Ambulatory and capable of self care, unable to do work activities, up and about                 >50 % of waking hours                                                                                   []     3    Only limited self care, in bed greater than 50% of waking hours []     4    Completely disabled, no  self care, confined to bed or chair []     5    Moribund  History  Smoking status  . Never Smoker   Smokeless tobacco  . Never Used       Allergies  Allergen Reactions  . Prochlorperazine Edisylate Anaphylaxis    Compazine  . Aspirin     REACTION: nose bleeds    Current Outpatient Prescriptions  Medication Sig Dispense Refill  . acetaminophen (TYLENOL) 500 MG tablet Take 1,000 mg by mouth every 6 (six) hours as needed for mild pain.       . Biotin 1000 MCG tablet Take 1,000 mcg by mouth 2 (two) times daily.        . Calcium Carbonate-Vitamin D (CALCIUM 500 + D) 500-125 MG-UNIT TABS Take 1 tablet by mouth daily.       Marland Kitchen docusate sodium (STOOL SOFTENER) 100 MG capsule Take 100 mg by mouth 2 (two)  times daily.        Marland Kitchen estradiol (ESTRACE) 0.1 MG/GM vaginal cream Place 1 Applicatorful vaginally 2 (two) times a week. On Wednesday and Sunday.      . gabapentin (NEURONTIN) 600 MG tablet Take 600 mg by mouth 3 (three) times daily.      . Glucosamine-Chondroit-Vit C-Mn (GLUCOSAMINE CHONDR 1500 COMPLX PO) Take 1 capsule by mouth daily.       . hydroxychloroquine (PLAQUENIL) 200 MG tablet Take 200 mg by mouth 2 (two) times daily.       . meloxicam (MOBIC) 15 MG tablet Take 15 mg by mouth daily.        . Multiple Vitamin (MULTIVITAMIN) tablet Take 1 tablet by mouth daily.        Marland Kitchen omeprazole (PRILOSEC) 20 MG capsule Take 1 capsule (20 mg total) by mouth 2 (two) times daily before a meal.  60 capsule  11  . ondansetron (ZOFRAN ODT) 4 MG disintegrating tablet Take 1 tablet (4 mg total) by mouth every 8 (eight) hours as needed for nausea or vomiting.  20 tablet  0  . oxyCODONE (OXY IR/ROXICODONE) 5 MG immediate release tablet Take 1 tablet (5 mg total) by mouth every 4 (four) hours as needed for severe pain.  30 tablet  0  . Polyethyl Glycol-Propyl Glycol (SYSTANE OP) Place 1 drop into both eyes 2 (two) times daily as needed (dry eyes).      . polyethylene glycol powder (GLYCOLAX/MIRALAX) powder  Take 17 g by mouth 2 (two) times daily as needed.  3350 g  1  . predniSONE (DELTASONE) 5 MG tablet Take 5 mg by mouth daily with breakfast.       . dexamethasone (DECADRON) 4 MG tablet 4 mg by mouth twice a day the day before, day of and day after the chemotherapy every three-weeks.  30 tablet  0  . folic acid (FOLVITE) 1 MG tablet Take 1 tablet (1 mg total) by mouth daily.  30 tablet  2   No current facility-administered medications for this visit.     Physical Exam: BP 125/72  Pulse 82  Resp 20  Ht 5\' 2"  (1.575 m)  Wt 103 lb (46.72 kg)  BMI 18.83 kg/m2  SpO2 96%  General appearance: alert, cooperative and no distress Heart: regular rate and rhythm Lungs: left lung is clear; diminished at right apex but right base is clear Abdomen: Soft, minimal tenderness with palpation to right upper quadrant, minor distention, bowel sounds present Extremities: no edema Wound: Completely healed  Diagnostic Studies & Laboratory data:         Recent Radiology Findings: Dg Chest 2 View  09/08/2013   CLINICAL DATA:  Lung cancer.  EXAM: CHEST  2 VIEW  COMPARISON:  07/14/2013.  FINDINGS: Trachea is midline. Heart size stable. Postoperative changes and volume loss in the right hemi thorax. Resolved air at the apex of the right hemi thorax with fluid seen in its place. Left lung is clear. No pleural fluid. Dextro convex thoracolumbar scoliosis.  IMPRESSION: Postoperative changes and volume loss in the right hemi thorax with a small amount of right apical pleural fluid.   Electronically Signed   By: Lorin Picket M.D.   On: 09/08/2013 13:56      Recent Labs: Lab Results  Component Value Date   WBC 4.6 08/02/2013   HGB 10.3 Repeated and Verified* 08/02/2013   HCT 32.0* 08/02/2013   PLT 169 08/02/2013   GLUCOSE 99 08/02/2013   CHOL  208* 08/13/2011   TRIG 65.0 08/13/2011   HDL 82.10 08/13/2011   LDLDIRECT 100.9 08/13/2011   LDLCALC 102* 03/07/2010   ALT 23 08/02/2013   AST 33 08/02/2013   NA 137  08/02/2013   K 4.7 08/02/2013   CL 93* 06/11/2013   CREATININE 1.0 08/02/2013   BUN 26.6* 08/02/2013   CO2 28 08/02/2013   TSH 4.22 08/13/2011   INR 0.89 06/02/2013      Assessment / Plan:   Stable following right lung resection, she is consulted medical oncology but is only under observational treatment currently. Plan see the patient back in 6 months after her followup CT scan part he ordered by oncology.   [name] MD      [address] [place] [address] Office [phone]   Beeper [phone]

## 2013-09-20 ENCOUNTER — Encounter: Payer: Self-pay | Admitting: Family Medicine

## 2013-09-29 NOTE — Telephone Encounter (Signed)
Would you follow up on this per your conversation with Ms Lemberger

## 2013-10-05 ENCOUNTER — Ambulatory Visit: Payer: Medicare PPO | Admitting: Family Medicine

## 2013-10-20 ENCOUNTER — Telehealth: Payer: Self-pay | Admitting: *Deleted

## 2013-10-20 NOTE — Telephone Encounter (Signed)
Spoke with the pt to follow-up on her e-chart message regarding the [place] form from [place].  Pt stated that she spoke with [employer] and they received 15 pages of information.  Pt stated that [employer] is to contact her next and she will know then if they will be needing anything from [place].  Asked pt to give me a call when she hears from [facility].  Pt agreed.//AB/CMA

## 2013-11-11 ENCOUNTER — Ambulatory Visit (INDEPENDENT_AMBULATORY_CARE_PROVIDER_SITE_OTHER): Payer: Medicare PPO | Admitting: Family Medicine

## 2013-11-11 ENCOUNTER — Encounter: Payer: Self-pay | Admitting: Family Medicine

## 2013-11-11 VITALS — BP 140/84 | HR 71 | Temp 98.1°F | Resp 16 | Ht 62.0 in | Wt 103.5 lb

## 2013-11-11 DIAGNOSIS — C3410 Malignant neoplasm of upper lobe, unspecified bronchus or lung: Secondary | ICD-10-CM

## 2013-11-11 DIAGNOSIS — Z79899 Other long term (current) drug therapy: Secondary | ICD-10-CM

## 2013-11-11 DIAGNOSIS — E43 Unspecified severe protein-calorie malnutrition: Secondary | ICD-10-CM

## 2013-11-11 DIAGNOSIS — D649 Anemia, unspecified: Secondary | ICD-10-CM

## 2013-11-11 DIAGNOSIS — Z23 Encounter for immunization: Secondary | ICD-10-CM

## 2013-11-11 DIAGNOSIS — M899 Disorder of bone, unspecified: Secondary | ICD-10-CM

## 2013-11-11 DIAGNOSIS — Z0000 Encounter for general adult medical examination without abnormal findings: Secondary | ICD-10-CM | POA: Insufficient documentation

## 2013-11-11 DIAGNOSIS — C3411 Malignant neoplasm of upper lobe, right bronchus or lung: Secondary | ICD-10-CM

## 2013-11-11 DIAGNOSIS — M949 Disorder of cartilage, unspecified: Secondary | ICD-10-CM

## 2013-11-11 LAB — POCT URINALYSIS DIPSTICK
BILIRUBIN UA: NEGATIVE
Blood, UA: NEGATIVE
Glucose, UA: NEGATIVE
KETONES UA: NEGATIVE
Leukocytes, UA: NEGATIVE
Nitrite, UA: NEGATIVE
Protein, UA: NEGATIVE
Spec Grav, UA: 1.015
Urobilinogen, UA: 2
pH, UA: 6

## 2013-11-11 LAB — BASIC METABOLIC PANEL
BUN: 22 mg/dL (ref 6–23)
CO2: 29 mEq/L (ref 19–32)
Calcium: 9.6 mg/dL (ref 8.4–10.5)
Chloride: 98 mEq/L (ref 96–112)
Creat: 1.15 mg/dL — ABNORMAL HIGH (ref 0.50–1.10)
GLUCOSE: 77 mg/dL (ref 70–99)
POTASSIUM: 4.6 meq/L (ref 3.5–5.3)
SODIUM: 135 meq/L (ref 135–145)

## 2013-11-11 LAB — HEPATIC FUNCTION PANEL
ALBUMIN: 4.3 g/dL (ref 3.5–5.2)
ALT: 17 U/L (ref 0–35)
AST: 30 U/L (ref 0–37)
Alkaline Phosphatase: 46 U/L (ref 39–117)
BILIRUBIN DIRECT: 0.1 mg/dL (ref 0.0–0.3)
Indirect Bilirubin: 0.3 mg/dL (ref 0.2–1.2)
Total Bilirubin: 0.4 mg/dL (ref 0.2–1.2)
Total Protein: 7.1 g/dL (ref 6.0–8.3)

## 2013-11-11 LAB — CBC WITH DIFFERENTIAL/PLATELET
BASOS PCT: 0 % (ref 0–1)
Basophils Absolute: 0 10*3/uL (ref 0.0–0.1)
EOS ABS: 0.1 10*3/uL (ref 0.0–0.7)
Eosinophils Relative: 1 % (ref 0–5)
HEMATOCRIT: 31.9 % — AB (ref 36.0–46.0)
HEMOGLOBIN: 11 g/dL — AB (ref 12.0–15.0)
LYMPHS ABS: 1.1 10*3/uL (ref 0.7–4.0)
Lymphocytes Relative: 21 % (ref 12–46)
MCH: 28.4 pg (ref 26.0–34.0)
MCHC: 34.5 g/dL (ref 30.0–36.0)
MCV: 82.4 fL (ref 78.0–100.0)
MONO ABS: 0.4 10*3/uL (ref 0.1–1.0)
MONOS PCT: 8 % (ref 3–12)
Neutro Abs: 3.8 10*3/uL (ref 1.7–7.7)
Neutrophils Relative %: 70 % (ref 43–77)
Platelets: 193 10*3/uL (ref 150–400)
RBC: 3.87 MIL/uL (ref 3.87–5.11)
RDW: 14.3 % (ref 11.5–15.5)
WBC: 5.4 10*3/uL (ref 4.0–10.5)

## 2013-11-11 LAB — LIPID PANEL
Cholesterol: 204 mg/dL — ABNORMAL HIGH (ref 0–200)
HDL: 86 mg/dL (ref >39)
LDL Cholesterol: 104 mg/dL — ABNORMAL HIGH (ref 0–99)
Total CHOL/HDL Ratio: 2.4 Ratio
Triglycerides: 72 mg/dL (ref <150)
VLDL: 14 mg/dL (ref 0–40)

## 2013-11-11 LAB — TSH: TSH: 2.859 u[IU]/mL (ref 0.350–4.500)

## 2013-11-11 NOTE — Patient Instructions (Signed)
Follow up in 6 months to update progress Please ask Dr Earnest Conroy about a bone density test We'll notify you of your lab results and make any changes if needed Call with any questions or concerns Keep up the good work!  You are amazing! HAPPY BIRTHDAY!!!

## 2013-11-11 NOTE — Progress Notes (Signed)
Pre visit review using our clinic review tool, if applicable. No additional management support is needed unless otherwise documented below in the visit note. 

## 2013-11-11 NOTE — Progress Notes (Signed)
   Subjective:    Patient ID: [name], female    DOB: 21-Aug-1930, 80 y.o.   MRN: [id number]  HPI Here today for CPE.  Risk Factors: Lung cancer- following w/ Dr Julien Nordmann.  Refusing chemo treatment until she 'gains weight'.  Anemia- noted on labs done in June.  Following w/ Oncology.  Hyperlipidemia- pt's last total cholesterol 208.  Pt has lost considerable weight since then.  Herniated lumbar disc- on R, having 'terrible pain' radiating into R groin.  On Prednisone b/c they had to stop Plaquenil due to onset of macular degeneration.  Pt has had 3 epidural injxns w/ rheum.  Has appt scheduled in October  Tremor- bilateral hand tremor and weakness.  Pt has neuro at [facility]  Physical Activity: Depression: 'i just take it day by day'.  Denies current depressive sxs Hearing: normal to conversational tones, decreased to whispered voice ADL's: independent  Cognitive: normal linear thought process, memory and attention intact Home Safety: safe at home, lives w/ husband- caregivers come in and out Height, Weight, BMI, Visual Acuity: see vitals, vision corrected to 20/20 w/ glasses Counseling: UTD on colonoscopy, due for DEXA.  Pt doesn't need mammo due to recent PET scan. Labs Ordered: See A&P Care Plan: See A&P    Review of Systems Patient reports no hearing changes, adenopathy,fever, weight change,  persistant/recurrent hoarseness , swallowing issues, chest pain, palpitations, edema, persistant/recurrent cough, hemoptysis, gastrointestinal bleeding (melena, rectal bleeding), abdominal pain, significant heartburn, bowel changes, GU symptoms (dysuria, hematuria, incontinence), Gyn symptoms (abnormal  bleeding, pain),  syncope, memory loss, numbness & tingling, skin/hair/nail changes, abnormal bruising or bleeding, anxiety, or depression.   Some double vision on computer/tv- pt following for early macular degeneration + SOB on exertion or at high altitude since lung surgery +  weakness of hands    Objective:   Physical Exam General Appearance:    Alert, cooperative, no distress, appears stated age, frail  Head:    Normocephalic, without obvious abnormality, atraumatic  Eyes:    PERRL, conjunctiva/corneas clear, EOM's intact  Ears:    Normal TM's and external ear canals, both ears  Nose:   Nares normal, septum midline, mucosa normal, no drainage    or sinus tenderness  Throat:   Lips, mucosa, and tongue normal; teeth and gums normal  Neck:   Supple, symmetrical, trachea midline, no adenopathy;    Thyroid: no enlargement/tenderness/nodules  Back:     Obvious scoliosis  Lungs:     Clear to auscultation bilaterally, respirations unlabored  Chest Wall:    No tenderness or deformity   Heart:    Regular rate and rhythm, S1 and S2 normal, no murmur, rub   or gallop  Breast Exam:    Deferred  Abdomen:     Soft, non-tender, bowel sounds active all four quadrants,    no masses, no organomegaly  Genitalia:    Deferred  Rectal:    Extremities:   Arthritic hands  Pulses:   2+ and symmetric all extremities  Skin:   Skin color, texture, turgor normal, no rashes or lesions  Lymph nodes:   Cervical, supraclavicular, and axillary nodes normal  Neurologic:   CNII-XII intact, normal strength, sensation and reflexes    throughout          Assessment & Plan:

## 2013-11-12 LAB — VITAMIN D 25 HYDROXY (VIT D DEFICIENCY, FRACTURES): Vit D, 25-Hydroxy: 51 ng/mL (ref 30–89)

## 2013-11-12 NOTE — Assessment & Plan Note (Signed)
Pt's PE unchanged from previous.  Pt plans to discuss need for mammo w/ Oncology due to recent PET scan.  UTD on colonoscopy.  No need for paps.  Check labs.  Anticipatory guidance provided.

## 2013-11-12 NOTE — Assessment & Plan Note (Signed)
Pt previously on Plaquenil and now on prednisone.  Has appt w/ Rheum next month.  Get labs.

## 2013-11-12 NOTE — Assessment & Plan Note (Signed)
Pt w/ hx of iron deficiency anemia.  This worsened after recent lung surgery.  Check CBC.

## 2013-11-12 NOTE — Assessment & Plan Note (Signed)
Chronic problem.  Check Vit D level.  Pt to discuss getting DEXA w/ rheum.

## 2013-11-12 NOTE — Assessment & Plan Note (Signed)
Ongoing issue for pt.  Encouraged her to eat regularly and add protein supplements, like Ensure.  Will follow.

## 2013-11-12 NOTE — Assessment & Plan Note (Signed)
Pt s/p surgery.  Has not started chemo yet.  Plans to reassess this w/ oncology at future visits.

## 2013-11-22 ENCOUNTER — Encounter: Payer: Self-pay | Admitting: Family Medicine

## 2013-11-22 DIAGNOSIS — Z23 Encounter for immunization: Secondary | ICD-10-CM

## 2013-12-02 ENCOUNTER — Other Ambulatory Visit: Payer: Self-pay

## 2014-01-19 ENCOUNTER — Other Ambulatory Visit: Payer: Self-pay | Admitting: Family Medicine

## 2014-01-19 NOTE — Telephone Encounter (Signed)
Med filled.  

## 2014-01-23 ENCOUNTER — Ambulatory Visit (HOSPITAL_COMMUNITY)
Admission: RE | Admit: 2014-01-23 | Discharge: 2014-01-23 | Disposition: A | Discharge status: Home / Self Care | Payer: Medicare PPO | Source: Ambulatory Visit | Attending: Internal Medicine | Admitting: Internal Medicine

## 2014-01-23 ENCOUNTER — Encounter (HOSPITAL_COMMUNITY): Payer: Self-pay

## 2014-01-23 ENCOUNTER — Other Ambulatory Visit (HOSPITAL_BASED_OUTPATIENT_CLINIC_OR_DEPARTMENT_OTHER): Payer: Medicare PPO

## 2014-01-23 DIAGNOSIS — R0602 Shortness of breath: Secondary | ICD-10-CM | POA: Diagnosis not present

## 2014-01-23 DIAGNOSIS — C3411 Malignant neoplasm of upper lobe, right bronchus or lung: Secondary | ICD-10-CM

## 2014-01-23 DIAGNOSIS — J90 Pleural effusion, not elsewhere classified: Secondary | ICD-10-CM | POA: Diagnosis not present

## 2014-01-23 DIAGNOSIS — Z902 Acquired absence of lung [part of]: Secondary | ICD-10-CM | POA: Insufficient documentation

## 2014-01-23 DIAGNOSIS — R634 Abnormal weight loss: Secondary | ICD-10-CM | POA: Diagnosis not present

## 2014-01-23 DIAGNOSIS — M329 Systemic lupus erythematosus, unspecified: Secondary | ICD-10-CM | POA: Diagnosis not present

## 2014-01-23 DIAGNOSIS — J189 Pneumonia, unspecified organism: Secondary | ICD-10-CM | POA: Insufficient documentation

## 2014-01-23 LAB — CBC WITH DIFFERENTIAL/PLATELET
BASO%: 0.4 % (ref 0.0–2.0)
BASOS ABS: 0 10*3/uL (ref 0.0–0.1)
EOS ABS: 0 10*3/uL (ref 0.0–0.5)
EOS%: 0.4 % (ref 0.0–7.0)
HEMATOCRIT: 31.4 % — AB (ref 34.8–46.6)
HEMOGLOBIN: 10.2 g/dL — AB (ref 11.6–15.9)
LYMPH%: 19.6 % (ref 14.0–49.7)
MCH: 29.7 pg (ref 25.1–34.0)
MCHC: 32.5 g/dL (ref 31.5–36.0)
MCV: 91.5 fL (ref 79.5–101.0)
MONO#: 0.5 10*3/uL (ref 0.1–0.9)
MONO%: 9.5 % (ref 0.0–14.0)
NEUT#: 3.6 10*3/uL (ref 1.5–6.5)
NEUT%: 70.1 % (ref 38.4–76.8)
PLATELETS: 164 10*3/uL (ref 145–400)
RBC: 3.43 10*6/uL — ABNORMAL LOW (ref 3.70–5.45)
RDW: 13.5 % (ref 11.2–14.5)
WBC: 5.1 10*3/uL (ref 3.9–10.3)
lymph#: 1 10*3/uL (ref 0.9–3.3)

## 2014-01-23 LAB — COMPREHENSIVE METABOLIC PANEL (CC13)
ALBUMIN: 3.7 g/dL (ref 3.5–5.0)
ALK PHOS: 55 U/L (ref 40–150)
ALT: 15 U/L (ref 0–55)
AST: 30 U/L (ref 5–34)
Anion Gap: 10 mEq/L (ref 3–11)
BUN: 35.1 mg/dL — ABNORMAL HIGH (ref 7.0–26.0)
CO2: 25 mEq/L (ref 22–29)
Calcium: 9.3 mg/dL (ref 8.4–10.4)
Chloride: 103 mEq/L (ref 98–109)
Creatinine: 1.3 mg/dL — ABNORMAL HIGH (ref 0.6–1.1)
EGFR: 39 mL/min/{1.73_m2} — ABNORMAL LOW (ref >90)
Glucose: 93 mg/dl (ref 70–140)
POTASSIUM: 4.4 meq/L (ref 3.5–5.1)
SODIUM: 138 meq/L (ref 136–145)
Total Bilirubin: 0.28 mg/dL (ref 0.20–1.20)
Total Protein: 6.9 g/dL (ref 6.4–8.3)

## 2014-01-23 MED ORDER — IOHEXOL 300 MG/ML  SOLN
60.0000 mL | Freq: Once | INTRAMUSCULAR | Status: AC | PRN
  Administered 2014-01-23: 60 mL via INTRAVENOUS

## 2014-01-27 ENCOUNTER — Ambulatory Visit (INDEPENDENT_AMBULATORY_CARE_PROVIDER_SITE_OTHER): Payer: Medicare PPO | Admitting: *Deleted

## 2014-01-27 DIAGNOSIS — Z23 Encounter for immunization: Secondary | ICD-10-CM

## 2014-01-30 ENCOUNTER — Ambulatory Visit (HOSPITAL_BASED_OUTPATIENT_CLINIC_OR_DEPARTMENT_OTHER): Payer: Medicare PPO | Admitting: Internal Medicine

## 2014-01-30 ENCOUNTER — Telehealth: Payer: Self-pay | Admitting: Internal Medicine

## 2014-01-30 ENCOUNTER — Encounter: Payer: Self-pay | Admitting: Internal Medicine

## 2014-01-30 VITALS — BP 127/50 | HR 83 | Temp 98.4°F | Resp 18 | Ht 62.0 in | Wt 103.6 lb

## 2014-01-30 DIAGNOSIS — C3411 Malignant neoplasm of upper lobe, right bronchus or lung: Secondary | ICD-10-CM

## 2014-01-30 DIAGNOSIS — R918 Other nonspecific abnormal finding of lung field: Secondary | ICD-10-CM

## 2014-01-30 NOTE — Progress Notes (Signed)
[place] [phone]   [phone]  OFFICE PROGRESS NOTE  Raven Asa, MD [place] [address]  DIAGNOSIS: Stage IIA (T2b, N0, M0) non-small cell lung cancer, adenocarcinoma with positive EGFR mutation in exon 21 ([id number]) presented with right upper lobe lung mass diagnosed in March of 2015.  PRIOR THERAPY: Status post right upper lobectomy with wedge resection of the right middle lobe under the care of Dr. Servando Snare on 06/07/2013  CURRENT THERAPY: Observation.  INTERVAL HISTORY: [name] 80 y.o. female returns to the clinic today for followup visit. The patient is feeling fine today with no specific complaints except for arthritis pain. She is currently off her rheumatoid arthritis medication. She denied having any significant weakness or fatigue. She has no nausea or vomiting. She has no fever or chills. She denied having any significant chest pain, shortness of breath, cough or hemoptysis. She had repeat CT scan of the chest performed recently and she is here for evaluation and discussion of her scan results.   MEDICAL HISTORY: Past Medical History  Diagnosis Date  . Iron deficiency anemia   . Sjogren's syndrome     Dr. Abner Greenspan  . Abnormal ECG 11/2008    left bbb  . Raynaud's phenomenon 1965  . Lupus     takes Plaquenil daily  . Arthritis   . Colon polyps   . PONV (postoperative nausea and vomiting)   . Constipation     takes Colace daily  . GERD (gastroesophageal reflux disease)     takes Omeprazole daily  . Dry eyes     eye drops used   . Joint swelling   . Rheumatoid arthritis   . Back pain     scoliosis  . Osteoporosis   . Sciatica     ALLERGIES:  is allergic to prochlorperazine edisylate and aspirin.  MEDICATIONS:  Current Outpatient Prescriptions  Medication Sig Dispense Refill  . acetaminophen (TYLENOL) 500 MG tablet Take 1,000 mg by mouth every 6 (six) hours as needed for mild  pain.     . Biotin 1000 MCG tablet Take 1,000 mcg by mouth 2 (two) times daily.      . Calcium Carbonate-Vitamin D (CALCIUM 500 + D) 500-125 MG-UNIT TABS Take 1 tablet by mouth daily.     Marland Kitchen docusate sodium (STOOL SOFTENER) 100 MG capsule Take 100 mg by mouth 2 (two) times daily.      Marland Kitchen ESTRACE VAGINAL 0.1 MG/GM vaginal cream USE AS DIRECTED 42.5 g 1  . gabapentin (NEURONTIN) 600 MG tablet Take 600 mg by mouth 3 (three) times daily.    . Glucosamine-Chondroit-Vit C-Mn (GLUCOSAMINE CHONDR 1500 COMPLX PO) Take 1 capsule by mouth daily.     . meloxicam (MOBIC) 15 MG tablet Take 15 mg by mouth daily.      . Multiple Vitamin (MULTIVITAMIN) tablet Take 1 tablet by mouth daily.      Marland Kitchen omeprazole (PRILOSEC) 20 MG capsule Take 1 capsule (20 mg total) by mouth 2 (two) times daily before a meal. 60 capsule 11  . Polyethyl Glycol-Propyl Glycol (SYSTANE OP) Place 1 drop into both eyes 2 (two) times daily as needed (dry eyes).    . polyethylene glycol powder (GLYCOLAX/MIRALAX) powder Take 17 g by mouth 2 (two) times daily as needed. 3350 g 1  . predniSONE (DELTASONE) 5 MG tablet Take 5 mg by mouth daily with breakfast.      No current  facility-administered medications for this visit.    SURGICAL HISTORY:  Past Surgical History  Procedure Laterality Date  . Appendectomy    . Tonsillectomy    . Total abdominal hysterectomy  1980's   . Thoracic sympathetectomy  1965  . Cataract extraction Bilateral   . Colonoscopy    . Cardiovascular stress test      12/2008 mild fixed basal to mid septal perfusion defect felt likely due to artifact from LBBB, no ischemia, EF 58%  . Video bronchoscopy with endobronchial navigation N/A 05/04/2013    Procedure: VIDEO BRONCHOSCOPY WITH ENDOBRONCHIAL NAVIGATION;  Surgeon: [name], MD;  Location: [place];  Service: Thoracic;  Laterality: N/A;  . Video bronchoscopy N/A 06/07/2013    Procedure: VIDEO BRONCHOSCOPY;  Surgeon: [name], MD;  [facility];   Service: Thoracic;  Laterality: N/A;  . Video assisted thoracoscopy (vats)/wedge resection Right 06/07/2013    Procedure: VIDEO ASSISTED THORACOSCOPY (VATS)/right upper lobectomy, On Q;  Surgeon: [name], MD;  Location: [place];  Service: Thoracic;  Laterality: Right;  . Thoracotomy  06/07/2013    Procedure: MINI/LIMITED THORACOTOMY; right middle lobe wedge resection;  Surgeon: [name], MD;  Location: [facility] OR;  Service: Thoracic;;  . Lymph node dissection Right 06/07/2013    Procedure: LYMPH NODE DISSECTION;  Surgeon: [name], MD;  Location: [place];  Service: Thoracic;  Laterality: Right;    REVIEW OF SYSTEMS:  Constitutional: negative Eyes: negative Ears, nose, mouth, throat, and face: negative Respiratory: positive for pleurisy/chest pain Cardiovascular: negative Gastrointestinal: negative Genitourinary:negative Integument/breast: negative Hematologic/lymphatic: negative Musculoskeletal:negative Neurological: negative Behavioral/Psych: negative Endocrine: negative Allergic/Immunologic: negative   PHYSICAL EXAMINATION: General appearance: alert, cooperative and no distress Head: Normocephalic, without obvious abnormality, atraumatic Neck: no adenopathy, no JVD, supple, symmetrical, trachea midline and thyroid not enlarged, symmetric, no tenderness/mass/nodules Lymph nodes: Cervical, supraclavicular, and axillary nodes normal. Resp: clear to auscultation bilaterally Back: symmetric, no curvature. ROM normal. No CVA tenderness. Cardio: regular rate and rhythm, S1, S2 normal, no murmur, click, rub or gallop GI: soft, non-tender; bowel sounds normal; no masses,  no organomegaly Extremities: extremities normal, atraumatic, no cyanosis or edema Neurologic: Alert and oriented X 3, normal strength and tone. Normal symmetric reflexes. Normal coordination and gait  ECOG PERFORMANCE STATUS: 1 - Symptomatic but completely ambulatory  Blood pressure 127/50, pulse 83,  temperature 98.4 F (36.9 C), temperature source Oral, resp. rate 18, height _0  (1.575 m), weight 103 lb 9.6 oz (46.993 kg), SpO2 99 %.  LABORATORY DATA: Lab Results  Component Value Date   WBC 5.1 01/23/2014   HGB 10.2* 01/23/2014   HCT 31.4* 01/23/2014   MCV 91.5 01/23/2014   PLT 164 01/23/2014      Chemistry      Component Value Date/Time   NA 138 01/23/2014 1319   NA 135 11/11/2013 1557   K 4.4 01/23/2014 1319   K 4.6 11/11/2013 1557   CL 98 11/11/2013 1557   CO2 25 01/23/2014 1319   CO2 29 11/11/2013 1557   BUN 35.1* 01/23/2014 1319   BUN 22 11/11/2013 1557   CREATININE 1.3* 01/23/2014 1319   CREATININE 1.15* 11/11/2013 1557   CREATININE 1.20* 08/01/2013 1052      Component Value Date/Time   CALCIUM 9.3 01/23/2014 1319   CALCIUM 9.6 11/11/2013 1557   ALKPHOS 55 01/23/2014 1319   ALKPHOS 46 11/11/2013 1557   AST 30 01/23/2014 1319   AST 30 11/11/2013 1557   ALT 15 01/23/2014  1319   ALT 17 11/11/2013 1557   BILITOT 0.28 01/23/2014 1319   BILITOT 0.4 11/11/2013 1557       RADIOGRAPHIC STUDIES: Ct Chest W Contrast  01/23/2014   CLINICAL DATA:  Lung cancer. Right upper lobectomy. Weight loss. Lupus. Shortness of breath on exertion.  EXAM: CT CHEST WITH CONTRAST  TECHNIQUE: Multidetector CT imaging of the chest was performed during intravenous contrast administration.  CONTRAST:  107m OMNIPAQUE IOHEXOL 300 MG/ML  SOLN  COMPARISON:  04/22/2013 ; 04/27/2013  FINDINGS: Right upper lobectomy with a small amount of fluid in the pleural space at the right lung apex, and volume loss in the right hemithorax.  No pathologic thoracic adenopathy. Flat web like filling defect along the upper margin the right pulmonary artery, image 26 series 2, possibly due to an infolding of the vessel wall related to rotation/traction, less likely to be chronic pulmonary embolus.  7 by 4 mm focus of arterial phase enhancement posteriorly in segment 7 of the liver, image 47 series 2, stable from  03/25/2013.  Scarring at the apex of the right lung with adjacent resection clips. Reticulonodular interstitial opacity posteriorly in the right lung. Mild scarring in the left lower lobe posteriorly.  IMPRESSION: 1. No findings of recurrent malignancy at this time. Postoperative findings in the right chest. 2. Faint inflammatory reticulonodular interstitial accentuation in the right lung, potentially from atypical infectious bronchiolitis. 3. Flat web like filling defect along the upper margin of the right pulmonary artery, probably from infolding of the vessel related to rotation or traction. 4. Small amount of pleural fluid at the right lung apex. 5. 7 by 4 mm focus of arterial phase enhancement in segment 7 of the liver, stable from 03/25/2013, probably benign lesion such as hemangioma flash filling, but warranting observation on any subsequent imaging studies.   Electronically Signed   By: WSherryl BartersM.D.   On: 01/23/2014 17:55   ASSESSMENT AND PLAN: This is a very pleasant 80years old white female recently diagnosed with a stage IIA non-small cell lung cancer, adenocarcinoma with positive EGFR mutation in exon 21.  The patient declined the involvement in the clinical trial and she also declined adjuvant chemotherapy. Her recent CT scan of the chest showed no significant evidence for disease recurrence, but she has faint inflammatory areas in the right lung suspicious for atypical infectious bronchiolitis. She is currently asymptomatic with no significant fever or chills, no shortness of breath cough or hemoptysis. I discussed the scan results with the patient today. I recommended for her to continue on observation with repeat CT scan of the chest in 6 months. She would be seen by Dr. GServando Snarenext month's for evaluation and repeat chest x-ray. She was advised to call immediately if she has any concerning symptoms in the interval. The patient voices understanding of current disease status and  treatment options and is in agreement with the current care plan.  All questions were answered. The patient knows to call the clinic with any problems, questions or concerns. We can certainly see the patient much sooner if necessary.  Disclaimer: This note was dictated with voice recognition software. Similar sounding words can inadvertently be transcribed and may not be corrected upon review.

## 2014-01-30 NOTE — Telephone Encounter (Signed)
Pt confirmed labs/ov per 12/14 POF, gave pt AVS..... KJ °

## 2014-01-31 NOTE — Progress Notes (Signed)
Pt came in today for a shingles injection.  Pt tolerated injection well.//AB/CMA

## 2014-03-07 ENCOUNTER — Telehealth: Payer: Self-pay | Admitting: *Deleted

## 2014-03-07 NOTE — Telephone Encounter (Signed)
Received surgical clearance form via fax from [place]. Surgical clearance appt scheduled with Dr. Larose Kells for 03/28/2014. Patient insisted on scheduling with Dr. Larose Kells. JG//CMA

## 2014-03-16 ENCOUNTER — Encounter: Payer: Self-pay | Admitting: Cardiothoracic Surgery

## 2014-03-16 ENCOUNTER — Ambulatory Visit (INDEPENDENT_AMBULATORY_CARE_PROVIDER_SITE_OTHER): Payer: Medicare PPO | Admitting: Cardiothoracic Surgery

## 2014-03-16 VITALS — BP 152/71 | HR 74 | Resp 16 | Ht 64.0 in | Wt 103.0 lb

## 2014-03-16 DIAGNOSIS — Z9889 Other specified postprocedural states: Secondary | ICD-10-CM

## 2014-03-16 DIAGNOSIS — Z902 Acquired absence of lung [part of]: Secondary | ICD-10-CM

## 2014-03-16 DIAGNOSIS — C3411 Malignant neoplasm of upper lobe, right bronchus or lung: Secondary | ICD-10-CM

## 2014-03-16 NOTE — Progress Notes (Signed)
[place] Record #[id number] Date of Birth: 1930-11-02  Referring NL:[id number], Gunnar Fusi, MD Primary Cardiology: Primary Care:Katherine Birdie Riddle, MD  Chief Complaint:  Follow Up Visit  Surgery: 06/07/2013   OPERATIVE REPORT  PREOPERATIVE DIAGNOSIS: Right upper lobe adenocarcinoma of the lung.  POSTOPERATIVE DIAGNOSIS: Right upper lobe adenocarcinoma of the lung.  SURGICAL PROCEDURES: Video bronchoscopy, right video-assisted  thoracoscopy, mini-thoracotomy, right upper lobectomy with lymph node  dissection, wedge resection of right middle lobe, placement of On-Q.  SURGEON: Lanelle Bal, MD.  Lung cancer, Right upper lobe   Primary site: Lung (Right)   Staging method: AJCC 7th Edition   Clinical: Stage IIA (T2b, N0, M0) signed by [name], MD on 05/08/2013 10:00 PM   Pathologic: Stage IIA (T2b, N0, cM0) signed by [name], MD on 06/13/2013 10:49 AM   Summary: Stage IIA (T2b, N0, cM0)  History of Present Illness:     The patient is an 81 year old female who  presents for routine follow up s/p right mini thoracotomy and right upper lobectomy and wedge resection of the right middle lobe on 06/07/2013.Marland Kitchen Her rheumatoid arthritis is limiting her overall activity. She has seen Ortho and is planning right hip surgery as she has become increased limited due to her  Due to hip pain.  Zubrod Score: At the time of surgery this patient's most appropriate activity status/level should be described as: []     0    Normal activity, no symptoms [x]     1    Restricted in physical strenuous activity but ambulatory, able to do out light work []     2    Ambulatory and capable of self care, unable to do work activities, up and about                 >50 % of waking hours                                                                                   []     3    Only limited self care, in bed greater than 50% of waking hours []     4     Completely disabled, no self care, confined to bed or chair []     5    Moribund  History  Smoking status  . Never Smoker   Smokeless tobacco  . Never Used       Allergies  Allergen Reactions  . Prochlorperazine Edisylate Anaphylaxis    Compazine  . Aspirin     REACTION: nose bleeds    Current Outpatient Prescriptions  Medication Sig Dispense Refill  . acetaminophen (TYLENOL) 500 MG tablet Take 1,000 mg by mouth every 6 (six) hours as needed for mild pain.     . Biotin 1000 MCG tablet Take 1,000 mcg by mouth 2 (two) times daily.      . Calcium Carbonate-Vitamin D (CALCIUM 500 + D) 500-125 MG-UNIT TABS Take 1 tablet by mouth daily.     Marland Kitchen docusate sodium (STOOL SOFTENER) 100 MG capsule Take 100 mg  by mouth 2 (two) times daily.      Marland Kitchen ESTRACE VAGINAL 0.1 MG/GM vaginal cream USE AS DIRECTED 42.5 g 1  . gabapentin (NEURONTIN) 600 MG tablet Take 600 mg by mouth 3 (three) times daily.    . Glucosamine-Chondroit-Vit C-Mn (GLUCOSAMINE CHONDR 1500 COMPLX PO) Take 1 capsule by mouth daily.     . meloxicam (MOBIC) 15 MG tablet Take 15 mg by mouth daily.      . Multiple Vitamin (MULTIVITAMIN) tablet Take 1 tablet by mouth daily.      Marland Kitchen omeprazole (PRILOSEC) 20 MG capsule Take 1 capsule (20 mg total) by mouth 2 (two) times daily before a meal. 60 capsule 11  . polyethylene glycol powder (GLYCOLAX/MIRALAX) powder Take 17 g by mouth 2 (two) times daily as needed. 3350 g 1   No current facility-administered medications for this visit.     Physical Exam: BP 152/71 mmHg  Pulse 74  Resp 16  Ht 5\' 4"  (1.626 m)  Wt 103 lb (46.72 kg)  BMI 17.67 kg/m2  SpO2 98%  General appearance: alert, cooperative and no distress Heart: regular rate and rhythm Lungs: left lung is clear; diminished at right apex but right base is clear Abdomen: Soft, minimal tenderness with palpation to right upper quadrant, minor distention, bowel sounds present Extremities: no edema Wound: Completely healed Now  walking with cane due to hip pain  Diagnostic Studies & Laboratory data:         Recent Radiology Findings: CLINICAL DATA: Lung cancer. Right upper lobectomy. Weight loss. Lupus. Shortness of breath on exertion.  EXAM: CT CHEST WITH CONTRAST  TECHNIQUE: Multidetector CT imaging of the chest was performed during intravenous contrast administration.  CONTRAST: 81mL OMNIPAQUE IOHEXOL 300 MG/ML SOLN  COMPARISON: 04/22/2013 ; 04/27/2013  FINDINGS: Right upper lobectomy with a small amount of fluid in the pleural space at the right lung apex, and volume loss in the right hemithorax.  No pathologic thoracic adenopathy. Flat web like filling defect along the upper margin the right pulmonary artery, image 26 series 2, possibly due to an infolding of the vessel wall related to rotation/traction, less likely to be chronic pulmonary embolus.  7 by 4 mm focus of arterial phase enhancement posteriorly in segment 7 of the liver, image 47 series 2, stable from 03/25/2013.  Scarring at the apex of the right lung with adjacent resection clips. Reticulonodular interstitial opacity posteriorly in the right lung. Mild scarring in the left lower lobe posteriorly.  IMPRESSION: 1. No findings of recurrent malignancy at this time. Postoperative findings in the right chest. 2. Faint inflammatory reticulonodular interstitial accentuation in the right lung, potentially from atypical infectious bronchiolitis. 3. Flat web like filling defect along the upper margin of the right pulmonary artery, probably from infolding of the vessel related to rotation or traction. 4. Small amount of pleural fluid at the right lung apex. 5. 7 by 4 mm focus of arterial phase enhancement in segment 7 of the liver, stable from 03/25/2013, probably benign lesion such as hemangioma flash filling, but warranting observation on any subsequent imaging studies.   Electronically Signed  By: Sherryl Barters M.D.  On: 01/23/2014 17:55  I have independently reviewed the above radiology studies  and reviewed the findings with the patient.   Recent Labs: Lab Results  Component Value Date   WBC 5.1 01/23/2014   HGB 10.2* 01/23/2014   HCT 31.4* 01/23/2014   PLT 164 01/23/2014   GLUCOSE 93 01/23/2014   CHOL 204* 11/11/2013  TRIG 72 11/11/2013   HDL 86 11/11/2013   LDLDIRECT 100.9 08/13/2011   LDLCALC 104* 11/11/2013   ALT 15 01/23/2014   AST 30 01/23/2014   NA 138 01/23/2014   K 4.4 01/23/2014   CL 98 11/11/2013   CREATININE 1.3* 01/23/2014   BUN 35.1* 01/23/2014   CO2 25 01/23/2014   TSH 2.859 11/11/2013   INR 0.89 06/02/2013      Assessment / Plan:   Stable following right lung resection without evidence of recurrence by CT  she is consulted medical oncology but is only under observational treatment currently. Plan see the patient back in 6 months after her followup CT scan part he ordered by oncology.   [name] MD      [address] [place] [address] Office [phone]   Beeper [phone]

## 2014-03-28 ENCOUNTER — Ambulatory Visit (INDEPENDENT_AMBULATORY_CARE_PROVIDER_SITE_OTHER): Payer: Medicare PPO | Admitting: Internal Medicine

## 2014-03-28 ENCOUNTER — Encounter: Payer: Self-pay | Admitting: Internal Medicine

## 2014-03-28 VITALS — BP 122/72 | HR 83 | Temp 97.9°F | Ht 64.0 in | Wt 103.5 lb

## 2014-03-28 DIAGNOSIS — E871 Hypo-osmolality and hyponatremia: Secondary | ICD-10-CM

## 2014-03-28 DIAGNOSIS — Z01818 Encounter for other preprocedural examination: Secondary | ICD-10-CM | POA: Diagnosis not present

## 2014-03-28 DIAGNOSIS — D649 Anemia, unspecified: Secondary | ICD-10-CM

## 2014-03-28 NOTE — Progress Notes (Signed)
Subjective:    Patient ID: [name], female    DOB: 02-21-1930, 81 y.o.   MRN: [id number]  DOS:  03/28/2014 Type of visit - description : pre-op evaluation Interval history: The patient recently went to see orthopedic surgery, she is a need of right hip replacement due to advanced DJD. She went to see her lung cancer doctor and she was clear by him according to the patient. Up to 4 weeks ago, she was able to be active at home, able to go to the second floor and down without problems; also used to ride a stationary bike for few minutes only    Review of Systems Denies chest pain, difficulty breathing or lower extremity edema No nausea, vomiting, diarrhea or blood in the stools.   Past Medical History  Diagnosis Date  . Iron deficiency anemia   . Sjogren's syndrome     Dr. Abner Greenspan  . Abnormal ECG 11/2008    left bbb  . Raynaud's phenomenon 1965  . Lupus     takes Plaquenil daily  . Arthritis   . Colon polyps   . PONV (postoperative nausea and vomiting)   . Constipation     takes Colace daily  . GERD (gastroesophageal reflux disease)     takes Omeprazole daily  . Dry eyes     eye drops used   . Joint swelling   . Rheumatoid arthritis   . Back pain     scoliosis  . Osteoporosis   . Sciatica     Past Surgical History  Procedure Laterality Date  . Appendectomy    . Tonsillectomy    . Total abdominal hysterectomy  1980's   . Thoracic sympathetectomy  1965  . Cataract extraction Bilateral   . Colonoscopy    . Cardiovascular stress test      12/2008 mild fixed basal to mid septal perfusion defect felt likely due to artifact from LBBB, no ischemia, EF 58%  . Video bronchoscopy with endobronchial navigation N/A 05/04/2013    Procedure: VIDEO BRONCHOSCOPY WITH ENDOBRONCHIAL NAVIGATION;  Surgeon: [name], MD;  Location: [place];  Service: Thoracic;  Laterality: N/A;  . Video bronchoscopy N/A 06/07/2013    Procedure: VIDEO BRONCHOSCOPY;  Surgeon: [name], MD;  Location: [facility] OR;  Service: Thoracic;  Laterality: N/A;  . Video assisted thoracoscopy (vats)/wedge resection Right 06/07/2013    Procedure: VIDEO ASSISTED THORACOSCOPY (VATS)/right upper lobectomy, On Q;  Surgeon: [name], MD;  Location: [place];  Service: Thoracic;  Laterality: Right;  . Thoracotomy  06/07/2013    Procedure: MINI/LIMITED THORACOTOMY; right middle lobe wedge resection;  Surgeon: [name], MD;  Location[facility] OR;  Service: Thoracic;;  . Lymph node dissection Right 06/07/2013    Procedure: LYMPH NODE DISSECTION;  Surgeon: [name], MD;  Location: [place];  Service: Thoracic;  Laterality: Right;    History   Social History  . Marital Status: Married    Spouse Name: N/A    Number of Children: 1  . Years of Education: N/A   Occupational History  . n/a    Social History Main Topics  . Smoking status: Never Smoker   . Smokeless tobacco: Never Used  . Alcohol Use: Yes     Comment: rare/social  . Drug Use: No  . Sexual Activity: Not Currently   Other Topics Concern  . Not on file   Social History Narrative   Lives with husband, daughter and grandchild local.  Medication List       This list is accurate as of: 03/28/14  7:24 PM.  Always use your most recent med list.               acetaminophen 500 MG tablet  Commonly known as:  TYLENOL  Take 1,000 mg by mouth every 6 (six) hours as needed for mild pain.     Biotin 1000 MCG tablet  Take 1,000 mcg by mouth 2 (two) times daily.     CALCIUM 500 + D 500-125 MG-UNIT Tabs  Generic drug:  Calcium Carbonate-Vitamin D  Take 1 tablet by mouth daily.     ESTRACE VAGINAL 0.1 MG/GM vaginal cream  Generic drug:  estradiol  USE AS DIRECTED     gabapentin 600 MG tablet  Commonly known as:  NEURONTIN  Take 600 mg by mouth 3 (three) times daily.     GLUCOSAMINE CHONDR 1500 COMPLX PO  Take 1 capsule by mouth daily.     meloxicam 15 MG tablet  Commonly known as:  MOBIC    Take 15 mg by mouth daily.     multivitamin tablet  Take 1 tablet by mouth daily.     omeprazole 20 MG capsule  Commonly known as:  PRILOSEC  Take 1 capsule (20 mg total) by mouth 2 (two) times daily before a meal.     polyethylene glycol powder powder  Commonly known as:  GLYCOLAX/MIRALAX  Take 17 g by mouth 2 (two) times daily as needed.     STOOL SOFTENER 100 MG capsule  Generic drug:  docusate sodium  Take 100 mg by mouth 2 (two) times daily.           Objective:   Physical Exam  Constitutional: She is oriented to person, place, and time. She appears well-developed. No distress.  Slightly under weight appearing  HENT:  Head: Normocephalic and atraumatic.  Neck: No JVD present.  Cardiovascular:  RRR, no murmur , rub or gallop  Pulmonary/Chest: Effort normal. No stridor. No respiratory distress.  CTA B  Abdominal: Soft. Bowel sounds are normal. She exhibits no distension and no mass. There is no tenderness. There is no rebound and no guarding.  No organomegaly  Musculoskeletal: She exhibits no edema or tenderness.  Lymphadenopathy:    She has no cervical adenopathy.  Neurological: She is alert and oriented to person, place, and time. No cranial nerve deficit. She exhibits normal muscle tone. Coordination normal.  Speech normal, gait  assisted with a cane, somehow difficult due to DJD.  Skin: Skin is warm and dry. No pallor.  No jaundice  Psychiatric: She has a normal mood and affect. Her behavior is normal. Judgment and thought content normal.  Vitals reviewed.       Assessment & Plan:     Problem List Items Addressed This Visit      Other   Pre-op evaluation - Primary   Relevant Orders   EKG 12-Lead (Completed)   Basic metabolic panel   CBC with Differential/Platelet   Iron   Ferritin

## 2014-03-28 NOTE — Progress Notes (Signed)
Pre visit review using our clinic review tool, if applicable. No additional management support is needed unless otherwise documented below in the visit note. 

## 2014-03-28 NOTE — Assessment & Plan Note (Signed)
81 year old female, fragile appearing, with a history of lung cancer, rheumatoid arthritis and Raynaud's phenomena needs a right hip replacement. Up to 4 weeks ago she was able to be more active (but only reports few minutes in a  stationary bike). Her last labs show anemia and moderately increased creatinine. Recently saw her thoracic surgeon for a lung cancer follow-up and told she was okay. EKG today at baseline  In summary, I think the patient is  high risk given her overall physiological state. Plan:  Refer to cardiology for further eval. We will check a BMP and CBC, iron and ferritin.

## 2014-03-28 NOTE — Patient Instructions (Signed)
Please go to the lab today, will refer you to cardiology

## 2014-03-29 LAB — CBC WITH DIFFERENTIAL/PLATELET
Basophils Absolute: 0 10*3/uL (ref 0.0–0.1)
Basophils Relative: 0.8 % (ref 0.0–3.0)
EOS PCT: 1.2 % (ref 0.0–5.0)
Eosinophils Absolute: 0.1 10*3/uL (ref 0.0–0.7)
HCT: 30 % — ABNORMAL LOW (ref 36.0–46.0)
Hemoglobin: 10.1 g/dL — ABNORMAL LOW (ref 12.0–15.0)
LYMPHS PCT: 32.9 % (ref 12.0–46.0)
Lymphs Abs: 1.5 10*3/uL (ref 0.7–4.0)
MCHC: 33.8 g/dL (ref 30.0–36.0)
MCV: 87.7 fl (ref 78.0–100.0)
MONOS PCT: 8.4 % (ref 3.0–12.0)
Monocytes Absolute: 0.4 10*3/uL (ref 0.1–1.0)
NEUTROS PCT: 56.7 % (ref 43.0–77.0)
Neutro Abs: 2.6 10*3/uL (ref 1.4–7.7)
PLATELETS: 184 10*3/uL (ref 150.0–400.0)
RBC: 3.42 Mil/uL — ABNORMAL LOW (ref 3.87–5.11)
RDW: 13 % (ref 11.5–15.5)
WBC: 4.5 10*3/uL (ref 4.0–10.5)

## 2014-03-29 LAB — FERRITIN: Ferritin: 55.1 ng/mL (ref 10.0–291.0)

## 2014-03-29 LAB — BASIC METABOLIC PANEL
BUN: 26 mg/dL — ABNORMAL HIGH (ref 6–23)
CO2: 29 meq/L (ref 19–32)
Calcium: 9.4 mg/dL (ref 8.4–10.5)
Chloride: 96 mEq/L (ref 96–112)
Creatinine, Ser: 1 mg/dL (ref 0.40–1.20)
GFR: 56.51 mL/min — AB (ref >60.00)
Glucose, Bld: 89 mg/dL (ref 70–99)
Potassium: 4.3 mEq/L (ref 3.5–5.1)
SODIUM: 129 meq/L — AB (ref 135–145)

## 2014-03-29 LAB — IRON: IRON: 43 ug/dL (ref 42–145)

## 2014-03-30 ENCOUNTER — Telehealth: Payer: Self-pay

## 2014-03-30 NOTE — Addendum Note (Signed)
Addended by: Wilfrid Lund on: 03/30/2014 01:14 PM   Modules accepted: Orders

## 2014-03-30 NOTE — Telephone Encounter (Signed)
Surgical clearance form faxed back to [place] them that Pt has been referred to cardiology for further evaluation.

## 2014-04-05 ENCOUNTER — Ambulatory Visit (INDEPENDENT_AMBULATORY_CARE_PROVIDER_SITE_OTHER): Payer: Medicare PPO | Admitting: Cardiology

## 2014-04-05 ENCOUNTER — Encounter: Payer: Self-pay | Admitting: Cardiology

## 2014-04-05 VITALS — BP 144/78 | HR 88 | Ht 64.0 in | Wt 104.0 lb

## 2014-04-05 DIAGNOSIS — Z01810 Encounter for preprocedural cardiovascular examination: Secondary | ICD-10-CM

## 2014-04-05 DIAGNOSIS — Z01818 Encounter for other preprocedural examination: Secondary | ICD-10-CM

## 2014-04-05 DIAGNOSIS — C3410 Malignant neoplasm of upper lobe, unspecified bronchus or lung: Secondary | ICD-10-CM

## 2014-04-05 NOTE — Assessment & Plan Note (Signed)
Patient is scheduled for hip replacement. Electrocardiogram shows left bundle branch block which is old. Previous nuclear study showed preserved LV function and no ischemia. Prior to developing significant hip arthralgias she had good functional capacity with no cardiac symptoms while exercising which included riding a stationary bicycle, yoga and climbing stairs. I do not think further cardiac evaluation is indicated preoperatively. I think her risk is mildly increased because of age. She may proceed without further cardiac studies.

## 2014-04-05 NOTE — Patient Instructions (Signed)
Your physician recommends that you schedule a follow-up appointment in: AS NEEDED  

## 2014-04-05 NOTE — Assessment & Plan Note (Signed)
Status post resection. Followed by thoracic surgery.

## 2014-04-05 NOTE — Progress Notes (Signed)
HPI: 81 year old female for evaluation preoperatively prior to hip replacement. Echocardiogram 2010 showed an ejection fraction of 12-45%, grade 1 diastolic dysfunction, mild aortic insufficiency and mitral regurgitation and mild left atrial enlargement. Patient had a nuclear study in 2010 that showed an ejection fraction of 58% with a fixed septal defect related to left bundle branch block. No ischemia. Patient is limited somewhat by right hip arthralgias. This has been present for approximately 2 months. However prior to that she exercised routinely riding the stationary bicycle and was able to climb 2-3 flights of stairs with no dyspnea, chest pain or palpitations. There is no orthopnea, PND or pedal edema. No syncope.  Current Outpatient Prescriptions  Medication Sig Dispense Refill  . acetaminophen (TYLENOL) 500 MG tablet Take 1,000 mg by mouth every 6 (six) hours as needed for mild pain.     . Biotin 1000 MCG tablet Take 1,000 mcg by mouth 2 (two) times daily.      . Calcium Carbonate-Vitamin D (CALCIUM 500 + D) 500-125 MG-UNIT TABS Take 1 tablet by mouth daily.     Marland Kitchen docusate sodium (STOOL SOFTENER) 100 MG capsule Take 100 mg by mouth 2 (two) times daily.      Marland Kitchen ESTRACE VAGINAL 0.1 MG/GM vaginal cream USE AS DIRECTED 42.5 g 1  . gabapentin (NEURONTIN) 600 MG tablet Take 600 mg by mouth 3 (three) times daily.    . Glucosamine-Chondroit-Vit C-Mn (GLUCOSAMINE CHONDR 1500 COMPLX PO) Take 1 capsule by mouth daily.     . meloxicam (MOBIC) 15 MG tablet Take 15 mg by mouth daily.      . Multiple Vitamin (MULTIVITAMIN) tablet Take 1 tablet by mouth daily.      Marland Kitchen omeprazole (PRILOSEC) 20 MG capsule Take 1 capsule (20 mg total) by mouth 2 (two) times daily before a meal. 60 capsule 11  . polyethylene glycol powder (GLYCOLAX/MIRALAX) powder Take 17 g by mouth 2 (two) times daily as needed. 3350 g 1   No current facility-administered medications for this visit.    Allergies  Allergen Reactions    . Prochlorperazine Edisylate Anaphylaxis    Compazine  . Aspirin     REACTION: nose bleeds     Past Medical History  Diagnosis Date  . Iron deficiency anemia   . Sjogren's syndrome     Dr. Abner Greenspan  . Abnormal ECG 11/2008    left bbb  . Raynaud's phenomenon 1965  . Lupus     takes Plaquenil daily  . Arthritis   . Colon polyps   . Constipation     takes Colace daily  . GERD (gastroesophageal reflux disease)     takes Omeprazole daily  . Dry eyes     eye drops used   . Rheumatoid arthritis   . Back pain     scoliosis  . Osteoporosis   . Sciatica     Past Surgical History  Procedure Laterality Date  . Appendectomy    . Tonsillectomy    . Total abdominal hysterectomy  1980's   . Thoracic sympathetectomy  1965  . Cataract extraction Bilateral   . Colonoscopy    . Cardiovascular stress test      12/2008 mild fixed basal to mid septal perfusion defect felt likely due to artifact from LBBB, no ischemia, EF 58%  . Video bronchoscopy with endobronchial navigation N/A 05/04/2013    Procedure: VIDEO BRONCHOSCOPY WITH ENDOBRONCHIAL NAVIGATION;  Surgeon: [name], MD;  Location: [place];  Service: Thoracic;  Laterality: N/A;  . Video bronchoscopy N/A 06/07/2013    Procedure: VIDEO BRONCHOSCOPY;  Surgeon: [name], MD;  Location: [facility] OR;  Service: Thoracic;  Laterality: N/A;  . Video assisted thoracoscopy (vats)/wedge resection Right 06/07/2013    Procedure: VIDEO ASSISTED THORACOSCOPY (VATS)/right upper lobectomy, On Q;  Surgeon: [name], MD;  Location: [place];  Service: Thoracic;  Laterality: Right;  . Thoracotomy  06/07/2013    Procedure: MINI/LIMITED THORACOTOMY; right middle lobe wedge resection;  Surgeon: [name], MD;  Location: [facility] OR;  Service: Thoracic;;  . Lymph node dissection Right 06/07/2013    Procedure: LYMPH NODE DISSECTION;  Surgeon: [name], MD;  Location: [place];  Service: Thoracic;  Laterality: Right;    History   Social  History  . Marital Status: Married    Spouse Name: N/A  . Number of Children: 2  . Years of Education: N/A   Occupational History  . n/a    Social History Main Topics  . Smoking status: Never Smoker   . Smokeless tobacco: Never Used  . Alcohol Use: Yes     Comment: rare/social  . Drug Use: No  . Sexual Activity: Not Currently   Other Topics Concern  . Not on file   Social History Narrative   Lives with husband, daughter and grandchild local.    Family History  Problem Relation Age of Onset  . Coronary artery disease Father   . Colon cancer Father   . Diabetes Father   . Esophageal cancer Neg Hx   . Kidney disease Neg Hx   . Liver disease Neg Hx     ROS: right hip arthralgias but no fevers or chills, productive cough, hemoptysis, dysphasia, odynophagia, melena, hematochezia, dysuria, hematuria, rash, seizure activity, orthopnea, PND, pedal edema, claudication. Remaining systems are negative.  Physical Exam:   Blood pressure 144/78, pulse 88, height 5\' 4"  (1.626 m), weight 104 lb (47.174 kg).  General:  Well developed/thin in NAD Skin warm/dry Patient not depressed No peripheral clubbing Back-normal HEENT-normal/normal eyelids Neck supple/normal carotid upstroke bilaterally; no bruits; no JVD; no thyromegaly chest - CTA/ normal expansion CV - RRR/normal S1 and S2; no murmurs, rubs or gallops;  PMI nondisplaced Abdomen -NT/ND, no HSM, no mass, + bowel sounds, no bruit 2+ femoral pulses, no bruits Ext-no edema, chords, 2+ DP Neuro-grossly nonfocal  ECG 03/28/2014-sinus rhythm, left bundle branch block.

## 2014-04-11 ENCOUNTER — Other Ambulatory Visit: Payer: Self-pay | Admitting: Surgical

## 2014-04-13 ENCOUNTER — Telehealth: Payer: Self-pay | Admitting: Internal Medicine

## 2014-04-13 NOTE — Telephone Encounter (Signed)
Due for a BMP DX hyponatremia, please arrange

## 2014-04-14 ENCOUNTER — Other Ambulatory Visit (HOSPITAL_COMMUNITY): Payer: Self-pay | Admitting: Orthopaedic Surgery

## 2014-04-14 NOTE — Telephone Encounter (Signed)
Letter printed and mailed to Pt.  

## 2014-04-17 ENCOUNTER — Other Ambulatory Visit: Payer: Self-pay | Admitting: Family Medicine

## 2014-04-17 ENCOUNTER — Other Ambulatory Visit: Payer: Self-pay | Admitting: General Practice

## 2014-04-17 MED ORDER — OMEPRAZOLE 20 MG PO CPDR: DELAYED_RELEASE_CAPSULE | ORAL | Status: DC

## 2014-04-17 NOTE — Telephone Encounter (Signed)
Med filled.  

## 2014-04-21 ENCOUNTER — Encounter (HOSPITAL_COMMUNITY): Payer: Self-pay

## 2014-04-21 ENCOUNTER — Encounter (HOSPITAL_COMMUNITY)
Admission: RE | Admit: 2014-04-21 | Discharge: 2014-04-21 | Disposition: A | Discharge status: Home / Self Care | Payer: Medicare PPO | Source: Ambulatory Visit | Attending: Orthopaedic Surgery | Admitting: Orthopaedic Surgery

## 2014-04-21 DIAGNOSIS — Z01812 Encounter for preprocedural laboratory examination: Secondary | ICD-10-CM | POA: Insufficient documentation

## 2014-04-21 DIAGNOSIS — M1611 Unilateral primary osteoarthritis, right hip: Secondary | ICD-10-CM | POA: Insufficient documentation

## 2014-04-21 LAB — CBC
HEMATOCRIT: 30.8 % — AB (ref 36.0–46.0)
Hemoglobin: 9.9 g/dL — ABNORMAL LOW (ref 12.0–15.0)
MCH: 29.6 pg (ref 26.0–34.0)
MCHC: 32.1 g/dL (ref 30.0–36.0)
MCV: 92.2 fL (ref 78.0–100.0)
PLATELETS: 182 10*3/uL (ref 150–400)
RBC: 3.34 MIL/uL — ABNORMAL LOW (ref 3.87–5.11)
RDW: 13.2 % (ref 11.5–15.5)
WBC: 4.7 10*3/uL (ref 4.0–10.5)

## 2014-04-21 LAB — BASIC METABOLIC PANEL
ANION GAP: 8 (ref 5–15)
BUN: 32 mg/dL — ABNORMAL HIGH (ref 6–23)
CO2: 27 mmol/L (ref 19–32)
Calcium: 9.1 mg/dL (ref 8.4–10.5)
Chloride: 98 mmol/L (ref 96–112)
Creatinine, Ser: 0.92 mg/dL (ref 0.50–1.10)
GFR calc non Af Amer: 57 mL/min — ABNORMAL LOW (ref >90)
GFR, EST AFRICAN AMERICAN: 66 mL/min — AB (ref >90)
GLUCOSE: 84 mg/dL (ref 70–99)
Potassium: 4.5 mmol/L (ref 3.5–5.1)
Sodium: 133 mmol/L — ABNORMAL LOW (ref 135–145)

## 2014-04-21 LAB — PROTIME-INR
INR: 0.97 (ref 0.00–1.49)
PROTHROMBIN TIME: 12.9 s (ref 11.6–15.2)

## 2014-04-21 LAB — APTT: APTT: 30 s (ref 24–37)

## 2014-04-21 LAB — SURGICAL PCR SCREEN
MRSA, PCR: NEGATIVE
STAPHYLOCOCCUS AUREUS: NEGATIVE

## 2014-04-21 NOTE — Patient Instructions (Signed)
[name]  04/21/2014   Your procedure is scheduled on: Friday March 11th, 2016  Report to [facility] Main  Entrance and follow signs to               [name] at 530 AM.  Call this number if you have problems the morning of surgery [phone]   Remember:  Do not eat food or drink liquids :After Midnight.     Take these medicines the morning of surgery with A SIP OF WATER: Gabapentin, Omeprazole, ES tylenol                                You may not have any metal on your body including hair pins and              piercings  Do not wear jewelry, make-up, lotions, powders or perfumes.             Do not wear nail polish.  Do not shave  48 hours prior to surgery.              Men may shave face and neck.   Do not bring valuables to the hospital. [name].  Contacts, dentures or bridgework may not be worn into surgery.  Leave suitcase in the car. After surgery it may be brought to your room.     Patients discharged the day of surgery will not be allowed to drive home.  Name and phone number of your driver:  Special Instructions: N/A              Please read over the following fact sheets you were given: _____________________________________________________________________             [name] - Preparing for Surgery Before surgery, you can play an important role.  Because skin is not sterile, your skin needs to be as free of germs as possible.  You can reduce the number of germs on your skin by washing with CHG (chlorahexidine gluconate) soap before surgery.  CHG is an antiseptic cleaner which kills germs and bonds with the skin to continue killing germs even after washing. Please DO NOT use if you have an allergy to CHG or antibacterial soaps.  If your skin becomes reddened/irritated stop using the CHG and inform your nurse when you arrive at Short Stay. Do not shave (including legs and  underarms) for at least 48 hours prior to the first CHG shower.  You may shave your face/neck. Please follow these instructions carefully:  1.  Shower with CHG Soap the night before surgery and the  morning of Surgery.  2.  If you choose to wash your hair, wash your hair first as usual with your  normal  shampoo.  3.  After you shampoo, rinse your hair and body thoroughly to remove the  shampoo.                           4.  Use CHG as you would any other liquid soap.  You can apply chg directly  to the skin and wash  Gently with a scrungie or clean washcloth.  5.  Apply the CHG Soap to your body ONLY FROM THE NECK DOWN.   Do not use on face/ open                           Wound or open sores. Avoid contact with eyes, ears mouth and genitals (private parts).                       Wash face,  Genitals (private parts) with your normal soap.             6.  Wash thoroughly, paying special attention to the area where your surgery  will be performed.  7.  Thoroughly rinse your body with warm water from the neck down.  8.  DO NOT shower/wash with your normal soap after using and rinsing off  the CHG Soap.                9.  Pat yourself dry with a clean towel.            10.  Wear clean pajamas.            11.  Place clean sheets on your bed the night of your first shower and do not  sleep with pets. Day of Surgery : Do not apply any lotions/deodorants the morning of surgery.  Please wear clean clothes to the hospital/surgery center.  FAILURE TO FOLLOW THESE INSTRUCTIONS MAY RESULT IN THE CANCELLATION OF YOUR SURGERY PATIENT SIGNATURE_________________________________  NURSE SIGNATURE__________________________________  ________________________________________________________________________

## 2014-04-21 NOTE — Progress Notes (Signed)
CBC and BMP results done 04/21/14 faxed via EPIC to Dr Zollie Beckers.

## 2014-04-21 NOTE — Progress Notes (Signed)
LOV DR [facility]IR[facility] 05-16-13 ON CHART

## 2014-04-21 NOTE — Progress Notes (Addendum)
Dr Dawayne Patricia 2--17-2016 cardiac clearance note epic ekg 03-28-14

## 2014-04-27 NOTE — Anesthesia Preprocedure Evaluation (Addendum)
Anesthesia Evaluation  Patient identified by MRN, date of birth, ID band Patient awake    Reviewed: Allergy & Precautions, H&P , NPO status , Patient's Chart, lab work & pertinent test results  History of Anesthesia Complications (+) PONV and history of anesthetic complications  Airway Mallampati: I  TM Distance: >3 FB Neck ROM: Full    Dental  (+) Edentulous Upper, Edentulous Lower, Dental Advisory Given   Pulmonary    Pulmonary exam normal       Cardiovascular + Peripheral Vascular Disease  She subsequently had a nuclear stress test (signed on 12/25/08) that showed "EF 58% with fixed septal defect likely due to LBBB. No ischemia." PRN cardiology follow-up was recommended.    Echo on 12/11/08 showed: Normal LV size with mild global systolic dysfunction, EF 07-12%. Normal RV size and systolic function. Mild aortic insufficiency. Mild mitral insufficiency.    Neuro/Psych negative psych ROS   GI/Hepatic Neg liver ROS, GERD-  Medicated,  Endo/Other  negative endocrine ROS  Renal/GU negative Renal ROS     Musculoskeletal  (+) Arthritis -, Rheumatoid disorders,    Abdominal   Peds  Hematology  (+) anemia ,   Anesthesia Other Findings   Reproductive/Obstetrics                             Anesthesia Physical  Anesthesia Plan  ASA: III  Anesthesia Plan: Spinal   Post-op Pain Management:    Induction:   Airway Management Planned:   Additional Equipment:   Intra-op Plan:   Post-operative Plan:   Informed Consent: I have reviewed the patients History and Physical, chart, labs and discussed the procedure including the risks, benefits and alternatives for the proposed anesthesia with the patient or authorized representative who has indicated his/her understanding and acceptance.   Dental advisory given  Plan Discussed with: CRNA  Anesthesia Plan Comments:        Anesthesia  Quick Evaluation

## 2014-04-28 ENCOUNTER — Inpatient Hospital Stay (HOSPITAL_COMMUNITY): Payer: Medicare PPO

## 2014-04-28 ENCOUNTER — Encounter (HOSPITAL_COMMUNITY)
Admission: RE | Disposition: A | Discharge status: Home Health | Payer: Self-pay | Source: Ambulatory Visit | Attending: Orthopaedic Surgery

## 2014-04-28 ENCOUNTER — Inpatient Hospital Stay (HOSPITAL_COMMUNITY): Payer: Medicare PPO | Admitting: Anesthesiology

## 2014-04-28 ENCOUNTER — Encounter (HOSPITAL_COMMUNITY): Payer: Self-pay | Admitting: *Deleted

## 2014-04-28 ENCOUNTER — Inpatient Hospital Stay (HOSPITAL_COMMUNITY)
Admission: RE | Admit: 2014-04-28 | Discharge: 2014-05-01 | DRG: 470 | Disposition: A | Discharge status: Home Health | Payer: Medicare PPO | Source: Ambulatory Visit | Attending: Orthopaedic Surgery | Admitting: Orthopaedic Surgery

## 2014-04-28 DIAGNOSIS — D62 Acute posthemorrhagic anemia: Secondary | ICD-10-CM | POA: Diagnosis not present

## 2014-04-28 DIAGNOSIS — Z01812 Encounter for preprocedural laboratory examination: Secondary | ICD-10-CM | POA: Diagnosis not present

## 2014-04-28 DIAGNOSIS — M810 Age-related osteoporosis without current pathological fracture: Secondary | ICD-10-CM | POA: Diagnosis present

## 2014-04-28 DIAGNOSIS — Z85118 Personal history of other malignant neoplasm of bronchus and lung: Secondary | ICD-10-CM | POA: Diagnosis not present

## 2014-04-28 DIAGNOSIS — Z96641 Presence of right artificial hip joint: Secondary | ICD-10-CM

## 2014-04-28 DIAGNOSIS — R110 Nausea: Secondary | ICD-10-CM | POA: Diagnosis not present

## 2014-04-28 DIAGNOSIS — K219 Gastro-esophageal reflux disease without esophagitis: Secondary | ICD-10-CM | POA: Diagnosis present

## 2014-04-28 DIAGNOSIS — M1611 Unilateral primary osteoarthritis, right hip: Secondary | ICD-10-CM | POA: Diagnosis present

## 2014-04-28 DIAGNOSIS — Z419 Encounter for procedure for purposes other than remedying health state, unspecified: Secondary | ICD-10-CM

## 2014-04-28 DIAGNOSIS — M25551 Pain in right hip: Secondary | ICD-10-CM | POA: Diagnosis present

## 2014-04-28 HISTORY — PX: TOTAL HIP ARTHROPLASTY: SHX124

## 2014-04-28 LAB — ABO/RH: ABO/RH(D): A POS

## 2014-04-28 SURGERY — ARTHROPLASTY, HIP, TOTAL, ANTERIOR APPROACH
Anesthesia: Spinal | Site: Hip | Laterality: Right

## 2014-04-28 MED ORDER — PHENYLEPHRINE HCL 10 MG/ML IJ SOLN
INTRAMUSCULAR | Status: DC | PRN
  Administered 2014-04-28 (×3): 80 ug via INTRAVENOUS
  Administered 2014-04-28 (×2): 40 ug via INTRAVENOUS
  Administered 2014-04-28 (×2): 80 ug via INTRAVENOUS

## 2014-04-28 MED ORDER — 0.9 % SODIUM CHLORIDE (POUR BTL) OPTIME
TOPICAL | Status: DC | PRN
  Administered 2014-04-28: 1000 mL

## 2014-04-28 MED ORDER — DEXAMETHASONE SODIUM PHOSPHATE 10 MG/ML IJ SOLN
INTRAMUSCULAR | Status: AC
  Filled 2014-04-28: qty 1

## 2014-04-28 MED ORDER — METOCLOPRAMIDE HCL 5 MG/ML IJ SOLN
INTRAMUSCULAR | Status: DC | PRN
Start: 2014-04-28 — End: 2014-04-28
  Administered 2014-04-28 (×2): 5 mg via INTRAVENOUS

## 2014-04-28 MED ORDER — PROPOFOL 10 MG/ML IV BOLUS
INTRAVENOUS | Status: AC
  Filled 2014-04-28: qty 20

## 2014-04-28 MED ORDER — CEFAZOLIN SODIUM-DEXTROSE 2-3 GM-% IV SOLR
INTRAVENOUS | Status: AC
  Filled 2014-04-28: qty 50

## 2014-04-28 MED ORDER — BIOTIN 1000 MCG PO TABS: 1000.0000 ug | ORAL_TABLET | Freq: Every day | ORAL | Status: DC

## 2014-04-28 MED ORDER — PROPOFOL 10 MG/ML IV BOLUS
INTRAVENOUS | Status: DC | PRN
  Administered 2014-04-28: 30 mg via INTRAVENOUS
  Administered 2014-04-28: 20 mg via INTRAVENOUS

## 2014-04-28 MED ORDER — CEFAZOLIN SODIUM-DEXTROSE 2-3 GM-% IV SOLR
2.0000 g | INTRAVENOUS | Status: AC
  Administered 2014-04-28: 2 g via INTRAVENOUS

## 2014-04-28 MED ORDER — ONDANSETRON HCL 4 MG PO TABS
4.0000 mg | ORAL_TABLET | Freq: Four times a day (QID) | ORAL | Status: DC | PRN
  Administered 2014-04-28 – 2014-05-01 (×4): 4 mg via ORAL
  Filled 2014-04-28 (×4): qty 1

## 2014-04-28 MED ORDER — ONDANSETRON HCL 4 MG/2ML IJ SOLN
INTRAMUSCULAR | Status: AC
  Filled 2014-04-28: qty 2

## 2014-04-28 MED ORDER — METOCLOPRAMIDE HCL 10 MG PO TABS: 5.0000 mg | ORAL_TABLET | Freq: Three times a day (TID) | ORAL | Status: DC | PRN

## 2014-04-28 MED ORDER — ZOLPIDEM TARTRATE 5 MG PO TABS: 5.0000 mg | ORAL_TABLET | Freq: Every evening | ORAL | Status: DC | PRN

## 2014-04-28 MED ORDER — MIDAZOLAM HCL 2 MG/2ML IJ SOLN
INTRAMUSCULAR | Status: AC
  Filled 2014-04-28: qty 2

## 2014-04-28 MED ORDER — HYDROMORPHONE HCL 1 MG/ML IJ SOLN: 0.2500 mg | INTRAMUSCULAR | Status: DC | PRN

## 2014-04-28 MED ORDER — ONDANSETRON HCL 4 MG/2ML IJ SOLN
4.0000 mg | Freq: Four times a day (QID) | INTRAMUSCULAR | Status: DC | PRN
  Administered 2014-04-29 – 2014-04-30 (×4): 4 mg via INTRAVENOUS
  Filled 2014-04-28 (×4): qty 2

## 2014-04-28 MED ORDER — METOCLOPRAMIDE HCL 5 MG/ML IJ SOLN
INTRAMUSCULAR | Status: AC
  Filled 2014-04-28: qty 2

## 2014-04-28 MED ORDER — DEXAMETHASONE SODIUM PHOSPHATE 10 MG/ML IJ SOLN
INTRAMUSCULAR | Status: DC | PRN
  Administered 2014-04-28: 10 mg via INTRAVENOUS

## 2014-04-28 MED ORDER — TRANEXAMIC ACID 100 MG/ML IV SOLN
1000.0000 mg | INTRAVENOUS | Status: AC
  Administered 2014-04-28: 1000 mg via INTRAVENOUS
  Filled 2014-04-28: qty 10

## 2014-04-28 MED ORDER — METHOCARBAMOL 500 MG PO TABS
500.0000 mg | ORAL_TABLET | Freq: Four times a day (QID) | ORAL | Status: DC | PRN
  Administered 2014-04-28 – 2014-05-01 (×3): 500 mg via ORAL
  Filled 2014-04-28 (×3): qty 1

## 2014-04-28 MED ORDER — ACETAMINOPHEN 650 MG RE SUPP: 650.0000 mg | Freq: Four times a day (QID) | RECTAL | Status: DC | PRN

## 2014-04-28 MED ORDER — DIPHENHYDRAMINE HCL 12.5 MG/5ML PO ELIX: 12.5000 mg | ORAL_SOLUTION | ORAL | Status: DC | PRN

## 2014-04-28 MED ORDER — DEXTROSE 5 % IV SOLN
20.0000 mg | INTRAVENOUS | Status: DC | PRN
  Administered 2014-04-28: 50 ug/min via INTRAVENOUS

## 2014-04-28 MED ORDER — PANTOPRAZOLE SODIUM 40 MG PO TBEC
40.0000 mg | DELAYED_RELEASE_TABLET | Freq: Two times a day (BID) | ORAL | Status: DC
  Administered 2014-04-28 – 2014-05-01 (×4): 40 mg via ORAL
  Filled 2014-04-28 (×8): qty 1

## 2014-04-28 MED ORDER — KETOROLAC TROMETHAMINE 30 MG/ML IJ SOLN
INTRAMUSCULAR | Status: AC
  Filled 2014-04-28: qty 1

## 2014-04-28 MED ORDER — PHENYLEPHRINE HCL 10 MG/ML IJ SOLN
INTRAMUSCULAR | Status: AC
  Filled 2014-04-28: qty 1

## 2014-04-28 MED ORDER — PROPOFOL INFUSION 10 MG/ML OPTIME
INTRAVENOUS | Status: DC | PRN
  Administered 2014-04-28: 75 ug/kg/min via INTRAVENOUS

## 2014-04-28 MED ORDER — PHENYLEPHRINE 40 MCG/ML (10ML) SYRINGE FOR IV PUSH (FOR BLOOD PRESSURE SUPPORT)
PREFILLED_SYRINGE | INTRAVENOUS | Status: AC
  Filled 2014-04-28: qty 10

## 2014-04-28 MED ORDER — CEFAZOLIN SODIUM 1-5 GM-% IV SOLN
1.0000 g | Freq: Four times a day (QID) | INTRAVENOUS | Status: AC
  Administered 2014-04-28 (×2): 1 g via INTRAVENOUS
  Filled 2014-04-28 (×2): qty 50

## 2014-04-28 MED ORDER — GLYCOPYRROLATE 0.2 MG/ML IJ SOLN
INTRAMUSCULAR | Status: AC
  Filled 2014-04-28: qty 1

## 2014-04-28 MED ORDER — RIVAROXABAN 10 MG PO TABS
10.0000 mg | ORAL_TABLET | Freq: Every day | ORAL | Status: DC
  Administered 2014-04-29 – 2014-05-01 (×3): 10 mg via ORAL
  Filled 2014-04-28 (×4): qty 1

## 2014-04-28 MED ORDER — SODIUM CHLORIDE 0.9 % IR SOLN
Status: DC | PRN
  Administered 2014-04-28: 1000 mL

## 2014-04-28 MED ORDER — GABAPENTIN 300 MG PO CAPS
600.0000 mg | ORAL_CAPSULE | Freq: Three times a day (TID) | ORAL | Status: DC
  Administered 2014-04-28 – 2014-04-30 (×4): 600 mg via ORAL
  Administered 2014-04-30: 300 mg via ORAL
  Administered 2014-05-01 (×2): 600 mg via ORAL
  Filled 2014-04-28 (×12): qty 2

## 2014-04-28 MED ORDER — PHENOL 1.4 % MT LIQD
1.0000 | OROMUCOSAL | Status: DC | PRN
  Filled 2014-04-28: qty 177

## 2014-04-28 MED ORDER — MENTHOL 3 MG MT LOZG: 1.0000 | LOZENGE | OROMUCOSAL | Status: DC | PRN

## 2014-04-28 MED ORDER — ALUM & MAG HYDROXIDE-SIMETH 200-200-20 MG/5ML PO SUSP: 30.0000 mL | ORAL | Status: DC | PRN

## 2014-04-28 MED ORDER — CALCIUM CARBONATE-VITAMIN D 500-200 MG-UNIT PO TABS
1.0000 | ORAL_TABLET | Freq: Every day | ORAL | Status: DC
  Filled 2014-04-28 (×4): qty 1

## 2014-04-28 MED ORDER — METHOCARBAMOL 1000 MG/10ML IJ SOLN
500.0000 mg | Freq: Four times a day (QID) | INTRAVENOUS | Status: DC | PRN
  Administered 2014-04-28: 500 mg via INTRAVENOUS
  Filled 2014-04-28 (×2): qty 5

## 2014-04-28 MED ORDER — POLYETHYLENE GLYCOL 3350 17 G PO PACK
17.0000 g | PACK | Freq: Every day | ORAL | Status: DC | PRN
  Administered 2014-04-29: 17 g via ORAL
  Filled 2014-04-28: qty 1

## 2014-04-28 MED ORDER — METOCLOPRAMIDE HCL 5 MG/ML IJ SOLN
5.0000 mg | Freq: Three times a day (TID) | INTRAMUSCULAR | Status: DC | PRN
  Administered 2014-04-28 – 2014-04-29 (×3): 10 mg via INTRAVENOUS
  Filled 2014-04-28 (×3): qty 2

## 2014-04-28 MED ORDER — MEPERIDINE HCL 50 MG/ML IJ SOLN: 6.2500 mg | INTRAMUSCULAR | Status: DC | PRN

## 2014-04-28 MED ORDER — LACTATED RINGERS IV SOLN
INTRAVENOUS | Status: DC | PRN
  Administered 2014-04-28 (×2): via INTRAVENOUS

## 2014-04-28 MED ORDER — HYDROMORPHONE HCL 1 MG/ML IJ SOLN: 1.0000 mg | INTRAMUSCULAR | Status: DC | PRN

## 2014-04-28 MED ORDER — SODIUM CHLORIDE 0.9 % IV SOLN
INTRAVENOUS | Status: DC
  Administered 2014-04-28 – 2014-04-30 (×4): via INTRAVENOUS

## 2014-04-28 MED ORDER — OXYCODONE HCL 5 MG PO TABS
5.0000 mg | ORAL_TABLET | ORAL | Status: DC | PRN
  Administered 2014-04-28: 10 mg via ORAL
  Administered 2014-04-28: 5 mg via ORAL
  Administered 2014-04-28: 10 mg via ORAL
  Administered 2014-04-28: 5 mg via ORAL
  Administered 2014-04-29 – 2014-05-01 (×11): 10 mg via ORAL
  Filled 2014-04-28 (×3): qty 2
  Filled 2014-04-28: qty 1
  Filled 2014-04-28: qty 2
  Filled 2014-04-28: qty 1
  Filled 2014-04-28 (×9): qty 2

## 2014-04-28 MED ORDER — KETOROLAC TROMETHAMINE 30 MG/ML IJ SOLN
30.0000 mg | Freq: Once | INTRAMUSCULAR | Status: AC | PRN
  Administered 2014-04-28: 30 mg via INTRAVENOUS

## 2014-04-28 MED ORDER — GABAPENTIN 600 MG PO TABS
600.0000 mg | ORAL_TABLET | Freq: Three times a day (TID) | ORAL | Status: DC
  Filled 2014-04-28 (×2): qty 1

## 2014-04-28 MED ORDER — ONDANSETRON HCL 4 MG/2ML IJ SOLN: 4.0000 mg | Freq: Once | INTRAMUSCULAR | Status: DC | PRN

## 2014-04-28 MED ORDER — ACETAMINOPHEN 325 MG PO TABS
650.0000 mg | ORAL_TABLET | Freq: Four times a day (QID) | ORAL | Status: DC | PRN
  Filled 2014-04-28: qty 2

## 2014-04-28 MED ORDER — DOCUSATE SODIUM 100 MG PO CAPS
100.0000 mg | ORAL_CAPSULE | Freq: Two times a day (BID) | ORAL | Status: DC
  Administered 2014-04-28 – 2014-05-01 (×7): 100 mg via ORAL

## 2014-04-28 MED ORDER — ADULT MULTIVITAMIN W/MINERALS CH
1.0000 | ORAL_TABLET | Freq: Every day | ORAL | Status: DC
  Filled 2014-04-28 (×4): qty 1

## 2014-04-28 SURGICAL SUPPLY — 43 items
APL SKNCLS STERI-STRIP NONHPOA (GAUZE/BANDAGES/DRESSINGS) ×1
BAG DECANTER FOR FLEXI CONT (MISCELLANEOUS) ×2 IMPLANT
BAG SPEC THK2 15X12 ZIP CLS (MISCELLANEOUS)
BAG ZIPLOCK 12X15 (MISCELLANEOUS) IMPLANT
BENZOIN TINCTURE PRP APPL 2/3 (GAUZE/BANDAGES/DRESSINGS) ×1 IMPLANT
BLADE SAW SGTL 18X1.27X75 (BLADE) ×2 IMPLANT
CAPT HIP TOTAL 2 ×1 IMPLANT
CELLS DAT CNTRL 66122 CELL SVR (MISCELLANEOUS) ×1 IMPLANT
COVER PERINEAL POST (MISCELLANEOUS) ×2 IMPLANT
DRAPE C-ARM 42X120 X-RAY (DRAPES) ×2 IMPLANT
DRAPE STERI IOBAN 125X83 (DRAPES) ×2 IMPLANT
DRAPE U-SHAPE 47X51 STRL (DRAPES) ×6 IMPLANT
DRSG AQUACEL AG ADV 3.5X10 (GAUZE/BANDAGES/DRESSINGS) ×2 IMPLANT
DURAPREP 26ML APPLICATOR (WOUND CARE) ×2 IMPLANT
ELECT BLADE TIP CTD 4 INCH (ELECTRODE) ×2 IMPLANT
ELECT REM PT RETURN 9FT ADLT (ELECTROSURGICAL) ×2
ELECTRODE REM PT RTRN 9FT ADLT (ELECTROSURGICAL) ×1 IMPLANT
FACESHIELD WRAPAROUND (MASK) ×8 IMPLANT
FACESHIELD WRAPAROUND OR TEAM (MASK) ×4 IMPLANT
GAUZE XEROFORM 1X8 LF (GAUZE/BANDAGES/DRESSINGS) IMPLANT
GLOVE BIO SURGEON STRL SZ7.5 (GLOVE) ×2 IMPLANT
GLOVE BIOGEL PI IND STRL 8 (GLOVE) ×2 IMPLANT
GLOVE BIOGEL PI INDICATOR 8 (GLOVE) ×2
GLOVE ECLIPSE 8.0 STRL XLNG CF (GLOVE) ×2 IMPLANT
GOWN STRL REUS W/TWL XL LVL3 (GOWN DISPOSABLE) ×4 IMPLANT
HANDPIECE INTERPULSE COAX TIP (DISPOSABLE) ×2
KIT BASIN OR (CUSTOM PROCEDURE TRAY) ×2 IMPLANT
PACK TOTAL JOINT (CUSTOM PROCEDURE TRAY) ×2 IMPLANT
PEN SKIN MARKING BROAD (MISCELLANEOUS) ×2 IMPLANT
RETRACTOR WND ALEXIS 18 MED (MISCELLANEOUS) ×1 IMPLANT
RTRCTR WOUND ALEXIS 18CM MED (MISCELLANEOUS) ×2
SET HNDPC FAN SPRY TIP SCT (DISPOSABLE) ×1 IMPLANT
STAPLER VISISTAT 35W (STAPLE) IMPLANT
STRIP CLOSURE SKIN 1/2X4 (GAUZE/BANDAGES/DRESSINGS) ×1 IMPLANT
SUT ETHIBOND NAB CT1 #1 30IN (SUTURE) ×2 IMPLANT
SUT MNCRL AB 4-0 PS2 18 (SUTURE) ×1 IMPLANT
SUT VIC AB 0 CT1 36 (SUTURE) ×2 IMPLANT
SUT VIC AB 1 CT1 36 (SUTURE) ×2 IMPLANT
SUT VIC AB 2-0 CT1 27 (SUTURE) ×4
SUT VIC AB 2-0 CT1 TAPERPNT 27 (SUTURE) ×2 IMPLANT
TOWEL OR 17X26 10 PK STRL BLUE (TOWEL DISPOSABLE) ×2 IMPLANT
TOWEL OR NON WOVEN STRL DISP B (DISPOSABLE) ×2 IMPLANT
TRAY FOLEY CATH 14FRSI W/METER (CATHETERS) ×2 IMPLANT

## 2014-04-28 NOTE — Progress Notes (Signed)
Utilization review completed.  

## 2014-04-28 NOTE — Plan of Care (Signed)
Problem: Consults Goal: Diagnosis- Total Joint Replacement Primary Total Hip     

## 2014-04-28 NOTE — Anesthesia Procedure Notes (Signed)
Spinal Patient location during procedure: OR Staffing Anesthesiologist: Nolon Nations Performed by: anesthesiologist  Preanesthetic Checklist Completed: patient identified, site marked, surgical consent, pre-op evaluation, timeout performed, IV checked, risks and benefits discussed and monitors and equipment checked Spinal Block Patient position: sitting Prep: ChloraPrep Patient monitoring: heart rate, continuous pulse ox and blood pressure Approach: right paramedian Location: L2-3 Injection technique: single-shot Needle Needle type: Spinocan  Needle gauge: 22 G Needle length: 9 cm Assessment Sensory level: T8 Additional Notes Severe rightward scoliosis. Expiration date of kit checked and confirmed. Patient tolerated procedure well, without complications.

## 2014-04-28 NOTE — Progress Notes (Signed)
PHARMACIST - PHYSICIAN ORDER COMMUNICATION  CONCERNING: P&T Medication Policy on Herbal Medications  DESCRIPTION:  This patient's order for: Biotin  has been noted.  This product(s) is classified as an "herbal" or natural product. Due to a lack of definitive safety studies or FDA approval, nonstandard manufacturing practices, plus the potential risk of unknown drug-drug interactions while on inpatient medications, the Pharmacy and Therapeutics Committee does not permit the use of "herbal" or natural products of this type within [facility].   ACTION TAKEN: The pharmacy department is unable to verify this order at this time and your patient has been informed of this safety policy. Please reevaluate patient's clinical condition at discharge and address if the herbal or natural product(s) should be resumed at that time.   Lindell Spar, PharmD, BCPS Pager: [phone] 04/28/2014 11:01 AM

## 2014-04-28 NOTE — Progress Notes (Signed)
Physical Therapy Treatment Patient Details Name: [name] MRN: [id number] DOB: 07/23/1930 Today's Date: 04/28/2014    History of Present Illness R THR    PT Comments    Pt s/p R THR presents with decreased R LE strength/ROM, and post op pain limiting functional mobility.  Pt should progress to dc home with family/hired assist and follow up HHPT.  Follow Up Recommendations  Home health PT     Equipment Recommendations  Rolling walker with 5" wheels    Recommendations for Other Services OT consult     Precautions / Restrictions Precautions Precautions: Fall Restrictions Weight Bearing Restrictions: No Other Position/Activity Restrictions: WBAT    Mobility  Bed Mobility Overal bed mobility: Needs Assistance Bed Mobility: Supine to Sit;Sit to Supine     Supine to sit: Mod assist;+2 for safety/equipment Sit to supine: Mod assist;+2 for safety/equipment   General bed mobility comments: cues for sequence and use of L LE to self assist  Transfers Overall transfer level: Needs assistance Equipment used: Rolling walker (2 wheeled) Transfers: Sit to/from Stand Sit to Stand: Min assist;Mod assist;+2 safety/equipment         General transfer comment: cues for LE management and use of UEs to self assist  Ambulation/Gait             General Gait Details: Pt stood at bedside only - ltd by onset dizziness and nausea   Stairs            Wheelchair Mobility    Modified Rankin (Stroke Patients Only)       Balance                                    Cognition Arousal/Alertness: Awake/alert Behavior During Therapy: WFL for tasks assessed/performed Overall Cognitive Status: Within Functional Limits for tasks assessed                      Exercises Total Joint Exercises Ankle Circles/Pumps: AROM;Both;15 reps;Supine Heel Slides: AAROM;Right;15 reps;Supine    General Comments        Pertinent Vitals/Pain Pain  Assessment: 0-10 Pain Score: 4  Pain Location: R hip Pain Descriptors / Indicators: Aching;Sore Pain Intervention(s): Limited activity within patient's tolerance;Monitored during session;Premedicated before session    [place] expects to be discharged to:: Private residence Living Arrangements: Alone Available Help at Discharge: Family;Personal care attendant Type of Home: House Home Access: Stairs to enter Entrance Stairs-Rails: None Home Layout: One level Home Equipment: Cane - single point      Prior Function Level of Independence: Independent;Independent with assistive device(s)          PT Goals (current goals can now be found in the care plan section) Acute Rehab PT Goals Patient Stated Goal: Resume previous lifestyle with decreased pain PT Goal Formulation: With patient Time For Goal Achievement: 05/05/14 Potential to Achieve Goals: Good    Frequency  7X/week    PT Plan      Co-evaluation             End of Session Equipment Utilized During Treatment: Gait belt Activity Tolerance: Patient tolerated treatment well;Other (comment) (nausea) Patient left: in bed;with call bell/phone within reach;with nursing/sitter in room;with family/visitor present     Time: 1007-1219 PT Time Calculation (min) (ACUTE ONLY): 34 min  Charges:  $Therapeutic Activity: 8-22 mins  G Codes:      Marielys Trinidad May 04, 2014, 6:11 PM

## 2014-04-28 NOTE — Brief Op Note (Signed)
04/28/2014  9:02 AM  PATIENT:  [name]  81 y.o. female  PRE-OPERATIVE DIAGNOSIS:  Severe osteoarthritis right hip  POST-OPERATIVE DIAGNOSIS:  Severe osteoarthritis right hip  PROCEDURE:  Procedure(s): RIGHT TOTAL HIP ARTHROPLASTY ANTERIOR APPROACH (Right)  SURGEON:  Surgeon(s) and Role:    * [name], MD - Primary  PHYSICIAN ASSISTANT: [name], PA-C  ANESTHESIA:   spinal  EBL:  Total I/O In: 1110 [I.V.:1000; IV Piggyback:110] Out: 750 [Urine:400; Blood:350]  BLOOD ADMINISTERED:none  DRAINS: none   LOCAL MEDICATIONS USED:  NONE  SPECIMEN:  No Specimen  DISPOSITION OF SPECIMEN:  N/A  COUNTS:  YES  TOURNIQUET:  * No tourniquets in log *  DICTATION: .Other Dictation: Dictation Number [id number]  PLAN OF CARE: Admit to inpatient   PATIENT DISPOSITION:  PACU - hemodynamically stable.   Delay start of Pharmacological VTE agent (>24hrs) due to surgical blood loss or risk of bleeding: no

## 2014-04-28 NOTE — Progress Notes (Signed)
Clinical Social Work Department BRIEF PSYCHOSOCIAL ASSESSMENT 04/28/2014  Patient:  [name], [name]     Account Number:  [id number]     Admit date:  04/28/2014  Clinical Social Worker:  Lacie Scotts  Date/Time:  04/28/2014 01:57 PM  Referred by:  Physician  Date Referred:  04/28/2014 Referred for  SNF Placement   Other Referral:   Interview type:  Patient Other interview type:    PSYCHOSOCIAL DATA Living Status:  HUSBAND Admitted from facility:   Level of care:   Primary support name:  Aleen Campi Primary support relationship to patient:  CHILD, ADULT Degree of support available:   supportive    CURRENT CONCERNS Current Concerns  Post-Acute Placement   Other Concerns:    SOCIAL WORK ASSESSMENT / PLAN Pt is an 81 yr old female living at home prior to hospitalization. CSW consulted for SNF placement. CSW met with pt / daughter in law to assist with d/c planning. Pt states that she has arranged for her spouse to go to an assisted living facility. Pt plans to return home with [facility] PT and will have the caregiver that normally assist with her spouses care provide assistance for her. CSW will continue to be available in case her plan changes and [facility] Rehab is needed.   Assessment/plan status:  Psychosocial Support/Ongoing Assessment of Needs Other assessment/ plan:   Information/referral to community resources:   [facility] services vs [facility] Rehab reviewed    PATIENT'S/FAMILY'S RESPONSE TO PLAN OF CARE: " I told my doctor I thought I would need to go to rehab but I've made new plans. "  Pt will see how she does with PT but is looking forward to returning home at d/c. Pt is motivated to begin working with therapy.    Werner Lean LCSW [phone]

## 2014-04-28 NOTE — H&P (Signed)
TOTAL HIP ADMISSION H&P  Patient is admitted for right total hip arthroplasty.  Subjective:  Chief Complaint: right hip pain  HPI: [name], 81 y.o. female, has a history of pain and functional disability in the right hip(s) due to arthritis and patient has failed non-surgical conservative treatments for greater than 12 weeks to include NSAID's and/or analgesics, corticosteriod injections, use of assistive devices and activity modification.  Onset of symptoms was gradual starting 5 years ago with gradually worsening course since that time.The patient noted no past surgery on the right hip(s).  Patient currently rates pain in the right hip at 10 out of 10 with activity. Patient has night pain, worsening of pain with activity and weight bearing, pain that interfers with activities of daily living and pain with passive range of motion. Patient has evidence of subchondral cysts, subchondral sclerosis, periarticular osteophytes and joint space narrowing by imaging studies. This condition presents safety issues increasing the risk of falls.  There is no current active infection.  Patient Active Problem List   Diagnosis Date Noted  . Osteoarthritis of right hip 04/28/2014  . Preop cardiovascular exam 04/05/2014  . Pre-op evaluation 03/28/2014  . Long-term use of high-risk medication 11/11/2013  . Routine general medical examination at a health care facility 11/11/2013  . Anemia, unspecified 11/11/2013  . Neuropathic pain 07/07/2013  . Constipation due to pain medication 07/07/2013  . Protein-calorie malnutrition, severe 06/08/2013  . Lung cancer, Right upper lobe 05/08/2013  . Anterior chest wall pain 09/18/2011  . Breast lump 09/18/2011  . Sciatica of right side 08/13/2011  . Sinusitis 06/13/2011  . Cerumen impaction 08/20/2010  . SINUSITIS - ACUTE-NOS 04/30/2010  . OSTEOPENIA 03/07/2010  . PARESTHESIA 03/07/2010  . CONTUSION OF LOWER LEG 11/29/2009  . DIZZINESS 10/01/2009  . BACK  PAIN, LEFT 03/16/2009  . Nonspecific (abnormal) findings on radiological and other examination of body structure 12/18/2008  . CT, CHEST, ABNORMAL 12/18/2008  . ABNORMAL ECHOCARDIOGRAM 12/14/2008  . URI 11/29/2008  . [place] SYNDROME 11/29/2008  . CONTACT DERMATITIS 11/02/2006  . HYPOGLYCEMIA 06/29/2006  . RAYNAUD'S DISEASE 06/29/2006   Past Medical History  Diagnosis Date  . Iron deficiency anemia   . Sjogren's syndrome   . Abnormal ECG 11/2008    left bbb  . Raynaud's phenomenon 1965  . Arthritis   . Colon polyps   . Constipation     takes Colace daily  . GERD (gastroesophageal reflux disease)     takes Omeprazole daily  . Dry eyes     eye drops used   . Rheumatoid arthritis   . Back pain     scoliosis  . Osteoporosis   . Sciatica   . Cancer 2015    lung  . Lupus     takes Plaquenil daily  . PONV (postoperative nausea and vomiting)     likes phenergan    Past Surgical History  Procedure Laterality Date  . Appendectomy    . Tonsillectomy    . Total abdominal hysterectomy  1980's   . Thoracic sympathetectomy  1965  . Cataract extraction Bilateral   . Colonoscopy    . Cardiovascular stress test      12/2008 mild fixed basal to mid septal perfusion defect felt likely due to artifact from LBBB, no ischemia, EF 58%  . Video bronchoscopy with endobronchial navigation N/A 05/04/2013    Procedure: VIDEO BRONCHOSCOPY WITH ENDOBRONCHIAL NAVIGATION;  Surgeon: [name], MD;  Location: [facility];  Service: Thoracic;  Laterality:  N/A;  . Video bronchoscopy N/A 06/07/2013    Procedure: VIDEO BRONCHOSCOPY;  Surgeon: [name], MD;  Location: [facility] OR;  Service: Thoracic;  Laterality: N/A;  . Video assisted thoracoscopy (vats)/wedge resection Right 06/07/2013    Procedure: VIDEO ASSISTED THORACOSCOPY (VATS)/right upper lobectomy, On Q;  Surgeon: [name], MD;  Location: [facility];  Service: Thoracic;  Laterality: Right;  . Thoracotomy  06/07/2013    Procedure:  MINI/LIMITED THORACOTOMY; right middle lobe wedge resection;  Surgeon: [name], MD;  Location: [facility] OR;  Service: Thoracic;;  . Lymph node dissection Right 06/07/2013    Procedure: LYMPH NODE DISSECTION;  Surgeon: [name], MD;  Location: [facility];  Service: Thoracic;  Laterality: Right;    Prescriptions prior to admission  Medication Sig Dispense Refill Last Dose  . acetaminophen (TYLENOL) 500 MG tablet Take 1,000 mg by mouth every 6 (six) hours as needed for mild pain.    04/28/2014 at 0300  . Biotin 1000 MCG tablet Take 1,000 mcg by mouth daily.    04/27/2014 at am  . Calcium Carbonate-Vitamin D (CALCIUM 500 + D) 500-125 MG-UNIT TABS Take 1 tablet by mouth daily.    04/27/2014 at am  . docusate sodium (STOOL SOFTENER) 100 MG capsule Take 100 mg by mouth 2 (two) times daily.     04/27/2014 at am  . ESTRACE VAGINAL 0.1 MG/GM vaginal cream USE AS DIRECTED 42.5 g 1 04/27/2014 at am  . gabapentin (NEURONTIN) 600 MG tablet Take 600 mg by mouth 3 (three) times daily.   04/27/2014 at am  . Glucosamine-Chondroit-Vit C-Mn (GLUCOSAMINE CHONDR 1500 COMPLX PO) Take 1 capsule by mouth daily.    04/27/2014 at am  . HYDROcodone-acetaminophen (NORCO/VICODIN) 5-325 MG per tablet Take 1 tablet by mouth 2 (two) times daily as needed for moderate pain.   0 04/25/2014  . meloxicam (MOBIC) 15 MG tablet Take 15 mg by mouth daily.     04/24/2014  . Multiple Vitamin (MULTIVITAMIN) tablet Take 1 tablet by mouth daily.     04/27/2014 at am  . omeprazole (PRILOSEC) 20 MG capsule TAKE 1 CAPSULE (20 MG TOTAL) BY MOUTH 2 (TWO) TIMES DAILY BEFORE A MEAL. 60 capsule 6 04/28/2014 at 0500  . polyethylene glycol powder (GLYCOLAX/MIRALAX) powder Take 17 g by mouth 2 (two) times daily as needed. (Patient not taking: Reported on 04/19/2014) 3350 g 1 Taking   Allergies  Allergen Reactions  . Prochlorperazine Edisylate Anaphylaxis    Compazine  . Aspirin     REACTION: nose bleeds    History  Substance Use Topics  . Smoking  status: Never Smoker   . Smokeless tobacco: Never Used  . Alcohol Use: Yes     Comment: rare/social    Family History  Problem Relation Age of Onset  . Coronary artery disease Father   . Colon cancer Father   . Diabetes Father   . Esophageal cancer Neg Hx   . Kidney disease Neg Hx   . Liver disease Neg Hx      Review of Systems  Musculoskeletal: Positive for joint pain.  All other systems reviewed and are negative.   Objective:  Physical Exam  Constitutional: She is oriented to person, place, and time. She appears well-developed and well-nourished.  HENT:  Head: Normocephalic and atraumatic.  Eyes: EOM are normal.  Neck: Normal range of motion. Neck supple.  Cardiovascular: Normal rate and regular rhythm.   Respiratory: Effort normal and breath sounds normal.  GI:  Soft. Bowel sounds are normal.  Musculoskeletal:       Right hip: She exhibits decreased range of motion, decreased strength, tenderness and bony tenderness.  Neurological: She is alert and oriented to person, place, and time.  Skin: Skin is warm and dry.  Psychiatric: She has a normal mood and affect.    Vital signs in last 24 hours: Temp:  [97.9 F (36.6 C)] 97.9 F (36.6 C) (03/11 0538) Pulse Rate:  [78] 78 (03/11 0538) Resp:  [16] 16 (03/11 0538) BP: (153)/(73) 153/73 mmHg (03/11 0538) SpO2:  [100 %] 100 % (03/11 0538) Weight:  [47.344 kg (104 lb 6 oz)] 47.344 kg (104 lb 6 oz) (03/11 0619)  Labs:   Estimated body mass index is 17.91 kg/(m^2) as calculated from the following:   Height as of this encounter: 5\' 4"  (1.626 m).   Weight as of this encounter: 47.344 kg (104 lb 6 oz).   Imaging Review Plain radiographs demonstrate severe degenerative joint disease of the right hip(s). The bone quality appears to be good for age and reported activity level.  Assessment/Plan:  End stage arthritis, right hip(s)  The patient history, physical examination, clinical judgement of the provider and imaging  studies are consistent with end stage degenerative joint disease of the right hip(s) and total hip arthroplasty is deemed medically necessary. The treatment options including medical management, injection therapy, arthroscopy and arthroplasty were discussed at length. The risks and benefits of total hip arthroplasty were presented and reviewed. The risks due to aseptic loosening, infection, stiffness, dislocation/subluxation,  thromboembolic complications and other imponderables were discussed.  The patient acknowledged the explanation, agreed to proceed with the plan and consent was signed. Patient is being admitted for inpatient treatment for surgery, pain control, PT, OT, prophylactic antibiotics, VTE prophylaxis, progressive ambulation and ADL's and discharge planning.The patient is planning to be discharged to skilled nursing facility

## 2014-04-28 NOTE — Transfer of Care (Signed)
Immediate Anesthesia Transfer of Care Note  Patient: [name]  Procedure(s) Performed: Procedure(s): RIGHT TOTAL HIP ARTHROPLASTY ANTERIOR APPROACH (Right)  Patient Location: PACU  Anesthesia Type:Spinal  Level of Consciousness: awake, alert  and oriented  Airway & Oxygen Therapy: Patient Spontanous Breathing and Patient connected to face mask oxygen  Post-op Assessment: Report given to RN and Post -op Vital signs reviewed and stable  Post vital signs: Reviewed and stable  Last Vitals:  Filed Vitals:   04/28/14 0538  BP: 153/73  Pulse: 78  Temp: 36.6 C  Resp: 16    Complications: No apparent anesthesia complications

## 2014-04-28 NOTE — Anesthesia Postprocedure Evaluation (Signed)
Anesthesia Post Note  Patient: [name]  Procedure(s) Performed: Procedure(s) (LRB): RIGHT TOTAL HIP ARTHROPLASTY ANTERIOR APPROACH (Right)  Anesthesia type: Spinal  Patient location: PACU  Post pain: Pain level controlled  Post assessment: Post-op Vital signs reviewed  Last Vitals: BP 135/59 mmHg  Pulse 72  Temp(Src) 36.6 C (Oral)  Resp 14  Ht 5\' 4"  (1.626 m)  Wt 104 lb 6 oz (47.344 kg)  BMI 17.91 kg/m2  SpO2 100%  Post vital signs: Reviewed  Level of consciousness: sedated  Complications: No apparent anesthesia complications

## 2014-04-29 LAB — BASIC METABOLIC PANEL
ANION GAP: 4 — AB (ref 5–15)
BUN: 18 mg/dL (ref 6–23)
CALCIUM: 8.1 mg/dL — AB (ref 8.4–10.5)
CO2: 27 mmol/L (ref 19–32)
Chloride: 101 mmol/L (ref 96–112)
Creatinine, Ser: 0.85 mg/dL (ref 0.50–1.10)
GFR calc Af Amer: 72 mL/min — ABNORMAL LOW (ref >90)
GFR, EST NON AFRICAN AMERICAN: 63 mL/min — AB (ref >90)
GLUCOSE: 92 mg/dL (ref 70–99)
POTASSIUM: 4.1 mmol/L (ref 3.5–5.1)
Sodium: 132 mmol/L — ABNORMAL LOW (ref 135–145)

## 2014-04-29 LAB — CBC
HEMATOCRIT: 21 % — AB (ref 36.0–46.0)
HEMOGLOBIN: 6.9 g/dL — AB (ref 12.0–15.0)
MCH: 29.7 pg (ref 26.0–34.0)
MCHC: 32.9 g/dL (ref 30.0–36.0)
MCV: 90.5 fL (ref 78.0–100.0)
Platelets: 131 10*3/uL — ABNORMAL LOW (ref 150–400)
RBC: 2.32 MIL/uL — ABNORMAL LOW (ref 3.87–5.11)
RDW: 13.1 % (ref 11.5–15.5)
WBC: 5.1 10*3/uL (ref 4.0–10.5)

## 2014-04-29 LAB — PREPARE RBC (CROSSMATCH)

## 2014-04-29 MED ORDER — ACETAMINOPHEN 325 MG PO TABS
650.0000 mg | ORAL_TABLET | Freq: Once | ORAL | Status: AC
  Administered 2014-04-29: 650 mg via ORAL
  Filled 2014-04-29: qty 2

## 2014-04-29 MED ORDER — FUROSEMIDE 10 MG/ML IJ SOLN
10.0000 mg | Freq: Once | INTRAMUSCULAR | Status: AC
  Administered 2014-04-29: 10 mg via INTRAVENOUS
  Filled 2014-04-29: qty 1

## 2014-04-29 MED ORDER — SODIUM CHLORIDE 0.9 % IV SOLN: Freq: Once | INTRAVENOUS | Status: DC

## 2014-04-29 MED ORDER — DIPHENHYDRAMINE HCL 25 MG PO CAPS
25.0000 mg | ORAL_CAPSULE | Freq: Once | ORAL | Status: AC
  Administered 2014-04-29: 25 mg via ORAL
  Filled 2014-04-29: qty 1

## 2014-04-29 NOTE — Progress Notes (Signed)
Physical Therapy Treatment Patient Details Name: [name] MRN: [id number] DOB: 1930-09-29 Today's Date: 05-25-2014    History of Present Illness R THR    PT Comments    Pt limited to bed therex this date 2* nausea/fatigue and ongoing transfusion.  Follow Up Recommendations  Home health PT     Equipment Recommendations  Rolling walker with 5" wheels    Recommendations for Other Services OT consult     Precautions / Restrictions Precautions Precautions: Fall Restrictions Weight Bearing Restrictions: No Other Position/Activity Restrictions: WBAT    Mobility  Bed Mobility               General bed mobility comments: OOB deferred to tomorrow  Transfers                    Ambulation/Gait                 Stairs            Wheelchair Mobility    Modified Rankin (Stroke Patients Only)       Balance                                    Cognition Arousal/Alertness: Awake/alert Behavior During Therapy: WFL for tasks assessed/performed Overall Cognitive Status: Within Functional Limits for tasks assessed                      Exercises Total Joint Exercises Ankle Circles/Pumps: AROM;Both;15 reps;Supine Quad Sets: AROM;Both;10 reps;Supine Heel Slides: AAROM;Right;Supine;20 reps Hip ABduction/ADduction: AAROM;Right;15 reps;Supine    General Comments        Pertinent Vitals/Pain Pain Assessment: 0-10 Pain Score: 4  Pain Location: R hip Pain Descriptors / Indicators: Aching;Sore Pain Intervention(s): Limited activity within patient's tolerance;Monitored during session;Premedicated before session    Home Living                      Prior Function            PT Goals (current goals can now be found in the care plan section) Acute Rehab PT Goals Patient Stated Goal: Resume previous lifestyle with decreased pain PT Goal Formulation: With patient Time For Goal Achievement:  05/05/14 Potential to Achieve Goals: Good Progress towards PT goals: Progressing toward goals    Frequency  7X/week    PT Plan Current plan remains appropriate    Co-evaluation             End of Session   Activity Tolerance: Other (comment) Patient left: in bed;with call bell/phone within reach;with nursing/sitter in room;with family/visitor present     Time: 1520-1535 PT Time Calculation (min) (ACUTE ONLY): 15 min  Charges:  $Therapeutic Exercise: 8-22 mins                    G Codes:      [name] 05/25/2014, 4:05 PM

## 2014-04-29 NOTE — Clinical Social Work Note (Signed)
CSW reviewed chart which reflected PT recommendation of HH/PT  No further CSW needs  CSW signing off  .Dede Query, LCSW [employer] Clinical Social Worker - Weekend Coverage cell #: [phone]

## 2014-04-29 NOTE — Op Note (Signed)
[name], [name]              ACCOUNT NO.:  [id number]  MEDICAL RECORD NO.:  [id number]  LOCATION:  [place]                         FACILITY:  [facility]  PHYSICIAN:  Lind Guest. Ninfa Linden, M.D.DATE OF BIRTH:  07-29-1930  DATE OF PROCEDURE:  04/28/2014 DATE OF DISCHARGE:                              OPERATIVE REPORT   PREOPERATIVE DIAGNOSES:  Primary osteoarthritis and degenerative joint disease, right hip.  POSTOPERATIVE DIAGNOSES:  Primary osteoarthritis and degenerative joint disease, right hip.  PROCEDURE:  Right total hip arthroplasty through direct anterior approach.  IMPLANTS:  DePuy Sector Gription acetabular component size 48 with a single screw, size 32+ 0 neutral polyethylene liner, size 11 Corail femoral component with standard offset, size 32+ 1.5 ceramic hip ball.  SURGEON:  Lind Guest. Ninfa Linden, M.D.  ASSISTANT:  Erskine Emery, PA-C  ANESTHESIA:  Spinal.  BLOOD LOSS:  Less than 500 mL.  COMPLICATIONS:  None.  ANTIBIOTICS:  2 g OF IV Ancef.  INDICATIONS:  [name] is 81 year old well known to me.  She is a very petite and thin individual with debilitating osteoarthritis involving her right hip.  She has x-rays that show severe sclerotic and cystic changes in her hip.  Her pain is daily, her mobility has been affected significantly, and her activities of daily living and quality of life have had a detrimental impact on her.  Due to her right hip pain, she has failed all forms of conservative treatment and does wish to proceed with total hip arthroplasty through direct anterior approach. I described in detail this approach to her as well as the risks of acute blood loss anemia, nerve and vessel injury, fracture, infection, dislocation, DVT.  She understands the goals are decreased pain, improved mobility, and overall improved quality of life.  PROCEDURE DESCRIPTION:  After informed consent was obtained, appropriate right hip was marked.  she was  brought to the operating room and spinal anesthesia was obtained while she was on her stretcher.  A Foley catheter was then placed and she was placed supine on the Hana fracture table with the perineal post in place and both legs in inline skeletal traction boots with no traction applied.  Her right operative hip was then prepped and draped with DuraPrep and sterile drapes.  A time-out was called and she was identified as correct patient and correct right hip.  I then made an incision inferior and posterior to the anterior superior iliac spine and carried this obliquely down the leg.  I dissected down the tensor fascia lata muscle and the tensor fascia was then divided longitudinally, so I could proceed with direct anterior approach to the hip.  We identified and cauterized the lateral femoral circumflex vessels and then we were able to open up the hip capsule in a L-type format finding a large joint effusion and significant osteoarthritis around her hip.  I placed a Cobra retractor around the lateral neck and up underneath the rectus femoris around the medial neck.  We opened up the hip capsule and then placed the Cobra retractor within the hip capsule.  We then made our femoral neck cut with an oscillating saw just proximal to the lesser trochanter and completed this with  an osteotome.  We placed a corkscrew guide in the femoral head and removed the femoral head in its entirety.  We then cleaned the acetabulum debris including remnants of the acetabular labrum, began reaming under direct visualization from a size 42 reamer all the way up to the size 48 in 2-mm increments.  With all reamers under direct visualization and also last reamer direct fluoroscopy, so we could obtain our depth of reaming, our inclination and anteversion.  Once we were pleased with this, we placed the real DePuy Sector Gription acetabular component, size 48, an apex hole eliminator and a single screw.  We then  placed the real 32+ 0 neutral liner for the size acetabular component.  Attention was then turned to the femur.  With the leg externally rotated to 100 degrees extended and adducted, we were able to place a Mueller retractor medially and a Hohmann retractor behind the greater trochanter.  I used a box cutting osteotome to enter the femoral canal and a rongeur to lateralize.  We then began broaching from size 8 broach, using the Corail femoral broaching system up to size 11.  With the size 11, we trialed the standard neck and 32+ 1 hip ball, was concerned about her tight tissue and the offset and I felt she was significantly long, so we dislocated the hip and went down to a size 10 trial stem; however, the 10 stem was just way too loose.  I felt even with her offset, I was not be able to get in a varus offset neck, so we went with the real size 11 Corail femoral component.  I tapped this into place and placed the real 32+ 1 ceramic hip ball.  We brought the leg back over and up with traction and internal rotation reducing the pelvis and it was stable throughout all its complete arc of rotation.  I then irrigated the soft tissue with normal saline solution using pulsatile lavage.  We were able to close the joint capsule with interrupted #1 Ethibond suture followed by running #1 Vicryl in the tensor fascia, 0 Vicryl in the deep tissue, 2-0 Vicryl in the subcutaneous tissue, 4-0 Monocryl subcuticular stitch, and Steri-Strips on the skin.  An Aquacel dressing was applied and she was taken off the Hana table, and taken to the recovery room in stable condition.  All final counts were correct. There were no complications noted.  Of note, Erskine Emery, PA-C, assisted in the entire case and his assistance was crucial for facilitating all aspects of this case.     Lind Guest. Ninfa Linden, M.D.     CYB/MEDQ  D:  04/28/2014  T:  04/29/2014  Job:  [id number]

## 2014-04-29 NOTE — Progress Notes (Signed)
Subjective: 1 Day Post-Op Procedure(s) (LRB): RIGHT TOTAL HIP ARTHROPLASTY ANTERIOR APPROACH (Right) Patient reports pain as mild.   Nausea and chills this AM.  Objective: Vital signs in last 24 hours: Temp:  [97.6 F (36.4 C)-98.3 F (36.8 C)] 98.2 F (36.8 C) (03/12 1017) Pulse Rate:  [62-73] 73 (03/12 1017) Resp:  [14-16] 16 (03/12 1017) BP: (96-135)/(38-59) 111/51 mmHg (03/12 1017) SpO2:  [96 %-100 %] 97 % (03/12 1017)  Intake/Output from previous day: 03/11 0701 - 03/12 0700 In: 4062.5 [P.O.:480; I.V.:3312.5; IV Piggyback:270] Out: 1835 [Urine:1485; Blood:350] Intake/Output this shift: Total I/O In: 120 [P.O.:120] Out: 300 [Urine:300]   Recent Labs  04/29/14 0440  HGB 6.9*    Recent Labs  04/29/14 0440  WBC 5.1  RBC 2.32*  HCT 21.0*  PLT 131*    Recent Labs  04/29/14 0440  NA 132*  K 4.1  CL 101  CO2 27  BUN 18  CREATININE 0.85  GLUCOSE 92  CALCIUM 8.1*   No results for input(s): LABPT, INR in the last 72 hours.  Right leg: Thigh supple Sensation intact distally Intact pulses distally Dorsiflexion/Plantar flexion intact Incision: dressing C/D/I Compartment soft  Assessment/Plan: 1 Day Post-Op Procedure(s) (LRB): RIGHT TOTAL HIP ARTHROPLASTY ANTERIOR APPROACH (Right) ABLA secondary to surgery 2 units of PRBC's as ordered  this AM. Most likely up with PT tomorrow once Hgb has improved   CLARK, GILBERT 04/29/2014, 11:02 AM

## 2014-04-29 NOTE — Progress Notes (Signed)
OT Cancellation Note  Patient Details Name: NAYALI TALERICO MRN: [id number] DOB: [birth year]   Cancelled Treatment:    Reason Eval/Treat Not Completed: Other (comment) Note pt receiving 2 units of blood today. Will check on pt in am.  Jules Schick  [phone] 04/29/2014, 12:51 PM

## 2014-04-29 NOTE — Progress Notes (Signed)
04/29/14 0600 Nursing Dr. Louanne Skye called reg hgb 6.9 this am. Order received to transfuse 2uprbc's.

## 2014-04-30 LAB — CBC
HCT: 31.3 % — ABNORMAL LOW (ref 36.0–46.0)
HEMOGLOBIN: 10.6 g/dL — AB (ref 12.0–15.0)
MCH: 30.3 pg (ref 26.0–34.0)
MCHC: 33.9 g/dL (ref 30.0–36.0)
MCV: 89.4 fL (ref 78.0–100.0)
Platelets: 135 10*3/uL — ABNORMAL LOW (ref 150–400)
RBC: 3.5 MIL/uL — ABNORMAL LOW (ref 3.87–5.11)
RDW: 13.7 % (ref 11.5–15.5)
WBC: 6.9 10*3/uL (ref 4.0–10.5)

## 2014-04-30 LAB — TYPE AND SCREEN
ABO/RH(D): A POS
Antibody Screen: NEGATIVE
UNIT DIVISION: 0
Unit division: 0

## 2014-04-30 MED ORDER — ALUM & MAG HYDROXIDE-SIMETH 200-200-20 MG/5ML PO SUSP: 30.0000 mL | ORAL | Status: DC | PRN

## 2014-04-30 MED ORDER — HYDROCODONE-ACETAMINOPHEN 5-325 MG PO TABS: 1.0000 | ORAL_TABLET | Freq: Two times a day (BID) | ORAL | Status: DC | PRN

## 2014-04-30 MED ORDER — RIVAROXABAN 10 MG PO TABS: 10.0000 mg | ORAL_TABLET | Freq: Every day | ORAL | Status: DC

## 2014-04-30 NOTE — Progress Notes (Signed)
Physical Therapy Treatment Patient Details Name: MIDORI DADO MRN: [id number] DOB: 07/03/1930 Today's Date: 04/30/2014    History of Present Illness R DA THA    PT Comments    Marked improvement in activity tolerance.    Follow Up Recommendations  Home health PT     Equipment Recommendations  Rolling walker with 5" wheels    Recommendations for Other Services OT consult     Precautions / Restrictions Precautions Precautions: Fall Restrictions Weight Bearing Restrictions: No Other Position/Activity Restrictions: WBAT    Mobility  Bed Mobility Overal bed mobility: Needs Assistance Bed Mobility: Supine to Sit     Supine to sit: Min assist     General bed mobility comments: OOB deferred to tomorrow  Transfers Overall transfer level: Needs assistance Equipment used: Rolling walker (2 wheeled) Transfers: Sit to/from Stand Sit to Stand: Min assist         General transfer comment: cues for UE/LE placement  Ambulation/Gait Ambulation/Gait assistance: Min assist Ambulation Distance (Feet): 70 Feet Assistive device: Rolling walker (2 wheeled) Gait Pattern/deviations: Decreased step length - right;Decreased step length - left;Step-to pattern;Step-through pattern;Shuffle     General Gait Details: cues for posture, position from RW and initial sequence   Stairs            Wheelchair Mobility    Modified Rankin (Stroke Patients Only)       Balance                                    Cognition Arousal/Alertness: Awake/alert Behavior During Therapy: WFL for tasks assessed/performed Overall Cognitive Status: Within Functional Limits for tasks assessed                      Exercises Total Joint Exercises Ankle Circles/Pumps: AROM;Both;15 reps;Supine Quad Sets: AROM;Both;10 reps;Supine Gluteal Sets: AROM;Both;10 reps;Supine Heel Slides: AAROM;Right;Supine;20 reps Hip ABduction/ADduction: AAROM;Right;Supine;20 reps     General Comments        Pertinent Vitals/Pain Pain Assessment: 0-10 Pain Score: 4  Pain Location: R hip Pain Descriptors / Indicators: Aching Pain Intervention(s): Limited activity within patient's tolerance;Monitored during session;Premedicated before session    Home Living Family/patient expects to be discharged to:: Private residence Living Arrangements: Alone Available Help at Discharge: Family;Personal care attendant Type of Home: House       Home Equipment: Shower seat Additional Comments: will have 24/7 at home    Prior Function Level of Independence: Independent;Independent with assistive device(s)          PT Goals (current goals can now be found in the care plan section) Acute Rehab PT Goals Patient Stated Goal: Resume previous lifestyle with decreased pain:  pt exercises daily PT Goal Formulation: With patient Time For Goal Achievement: 05/05/14 Potential to Achieve Goals: Good Progress towards PT goals: Progressing toward goals    Frequency  7X/week    PT Plan Current plan remains appropriate    Co-evaluation PT/OT/SLP Co-Evaluation/Treatment: Yes Reason for Co-Treatment: For patient/therapist safety PT goals addressed during session: Mobility/safety with mobility OT goals addressed during session: ADL's and self-care     End of Session Equipment Utilized During Treatment: Gait belt Activity Tolerance: Patient tolerated treatment well Patient left: in chair;with call bell/phone within reach;with family/visitor present     Time: 5427-0623 PT Time Calculation (min) (ACUTE ONLY): 35 min  Charges:  $Gait Training: 8-22 mins $Therapeutic Exercise: 8-22 mins  G Codes:      Neely Cecena 05/30/2014, 12:29 PM

## 2014-04-30 NOTE — Discharge Instructions (Signed)
Information on my medicine - XARELTO (Rivaroxaban)  This medication education was reviewed with me or my healthcare representative as part of my discharge preparation.  The pharmacist that spoke with me during my hospital stay was:  Minda Ditto, [facility]  Why was Xarelto prescribed for you? Xarelto was prescribed for you to reduce the risk of blood clots forming after orthopedic surgery. The medical term for these abnormal blood clots is venous thromboembolism (VTE).  What do you need to know about xarelto ? Take your Xarelto ONCE DAILY at the same time every day. You may take it either with or without food.  If you have difficulty swallowing the tablet whole, you may crush it and mix in applesauce just prior to taking your dose.  Take Xarelto exactly as prescribed by your doctor and DO NOT stop taking Xarelto without talking to the doctor who prescribed the medication.  Stopping without other VTE prevention medication to take the place of Xarelto may increase your risk of developing a clot.  After discharge, you should have regular check-up appointments with your healthcare provider that is prescribing your Xarelto.    What do you do if you miss a dose? If you miss a dose, take it as soon as you remember on the same day then continue your regularly scheduled once daily regimen the next day. Do not take two doses of Xarelto on the same day.   Important Safety Information A possible side effect of Xarelto is bleeding. You should call your healthcare provider right away if you experience any of the following: ? Bleeding from an injury or your nose that does not stop. ? Unusual colored urine (red or dark brown) or unusual colored stools (red or black). ? Unusual bruising for unknown reasons. ? A serious fall or if you hit your head (even if there is no bleeding).  Some medicines may interact with Xarelto and might increase your risk of bleeding while on Xarelto. To help avoid this,  consult your healthcare provider or pharmacist prior to using any new prescription or non-prescription medications, including herbals, vitamins, non-steroidal anti-inflammatory drugs (NSAIDs) and supplements.  This website has more information on Xarelto: [email or web address]  Pickup stool softener for constipation. Right lower extremity Weight Bearing as tolerated  No right hip precautions Progress activities slowly Expect right hip and possible left knee soreness and swelling. Apply heat or ice as needed. Keep right hip dressing clean dry and intact, may shower with dressing intact.

## 2014-04-30 NOTE — Progress Notes (Signed)
CARE MANAGEMENT NOTE 04/30/2014  Patient:  GENNESIS, HOGLAND   Account Number:  [id number]  Date Initiated:  04/30/2014  Documentation initiated by:  [facility]  Subjective/Objective Assessment:   RIGHT TOTAL HIP ARTHROPLASTY ANTERIOR APPROACH     Action/Plan:   Anticipated DC Date:  05/01/2014   Anticipated DC Plan:  [place]  CM consult      [facility] Choice  HOME HEALTH   Choice offered to / List presented to:  C-1 Patient   DME arranged  3-N-1  Vassie Moselle      DME agency  [place] arranged  HH-2 PT      [place]   Status of service:  Completed, signed off Medicare Important Message given?  YES (If response is "NO", the following Medicare IM given date fields will be blank) Date Medicare IM given:  04/30/2014 Medicare IM given by:  [facility] Date Additional Medicare IM given:   Additional Medicare IM given by:    Discharge Disposition:  [place]  Per UR Regulation:    If discussed at Long Length of Stay Meetings, dates discussed:    Comments:  04/30/2014 1100 NCM spoke to pt and offered choice for [facility]. Pt agreeable to [facility] for [facility]. Requesting 3n1 and RW for home. Contacted [facility] for DME for home. Jonnie Finner RN CCM Case Mgmt phone [phone]

## 2014-04-30 NOTE — Progress Notes (Signed)
Subjective: 2 Days Post-Op Procedure(s) (LRB): RIGHT TOTAL HIP ARTHROPLASTY ANTERIOR APPROACH (Right) Patient reports pain as mild.  Some nausea this AM.   Objective: Vital signs in last 24 hours: Temp:  [98.2 F (36.8 C)-100.2 F (37.9 C)] 100.2 F (37.9 C) (03/13 0512) Pulse Rate:  [73-86] 82 (03/13 0512) Resp:  [16-18] 16 (03/13 0512) BP: (111-137)/(47-59) 137/59 mmHg (03/13 0512) SpO2:  [90 %-99 %] 93 % (03/13 0512)  Intake/Output from previous day: 03/12 0701 - 03/13 0700 In: 2534.7 [P.O.:480; I.V.:1384.7; Blood:670] Out: 2700 [Urine:2700] Intake/Output this shift:     Recent Labs  04/29/14 0440 04/30/14 0520  HGB 6.9* 10.6*    Recent Labs  04/29/14 0440 04/30/14 0520  WBC 5.1 6.9  RBC 2.32* 3.50*  HCT 21.0* 31.3*  PLT 131* 135*    Recent Labs  04/29/14 0440  NA 132*  K 4.1  CL 101  CO2 27  BUN 18  CREATININE 0.85  GLUCOSE 92  CALCIUM 8.1*   No results for input(s): LABPT, INR in the last 72 hours. Right lower extremity:  Sensation intact distally Intact pulses distally Dorsiflexion/Plantar flexion intact Incision: scant drainage Compartment soft    Assessment/Plan: 2 Days Post-Op Procedure(s) (LRB): RIGHT TOTAL HIP ARTHROPLASTY ANTERIOR APPROACH (Right) Up with therapy  Slowly progress diet ABLA 2ndary to surgery s/p 2 units PRBC's Hgb much improved  Jader Desai 04/30/2014, 8:16 AM

## 2014-04-30 NOTE — Progress Notes (Signed)
Physical Therapy Treatment Note    04/30/14 1500  PT Visit Information  Last PT Received On 04/30/14  Assistance Needed +1  History of Present Illness R DA THA  PT Time Calculation  PT Start Time (ACUTE ONLY) 1421  PT Stop Time (ACUTE ONLY) 1439  PT Time Calculation (min) (ACUTE ONLY) 18 min  Subjective Data  Subjective Pt agreeable to ambulate again in hallway.  RN into room after to bring nausea meds.  Precautions  Precautions Fall  Restrictions  Other Position/Activity Restrictions WBAT  Pain Assessment  Pain Assessment 0-10  Pain Score 6  Pain Location R hip  Pain Descriptors / Indicators Aching  Pain Intervention(s) Limited activity within patient's tolerance;Monitored during session;Repositioned;Ice applied (declined meds prior to session,will ask RN later if needed)  Cognition  Arousal/Alertness Awake/alert  Behavior During Therapy WFL for tasks assessed/performed  Overall Cognitive Status Within Functional Limits for tasks assessed  Bed Mobility  Overal bed mobility Needs Assistance  Bed Mobility Supine to Sit;Sit to Supine  Supine to sit Min assist  General bed mobility comments educated in self assist for R LE however pt unable so daughter assisted and verbalized instructions for technique correctly  Transfers  Overall transfer level Needs assistance  Equipment used Rolling walker (2 wheeled)  Transfers Sit to/from Stand  Sit to Stand Min assist  General transfer comment assist for rise, cues for UE and LE positioning  Ambulation/Gait  Ambulation/Gait assistance Min assist  Ambulation Distance (Feet) 50 Feet  Assistive device Rolling walker (2 wheeled)  Gait Pattern/deviations Step-to pattern;Antalgic  General Gait Details verbal cues for posture and RW positioning, step length, no dizziness  PT - End of Session  Activity Tolerance Patient tolerated treatment well  Patient left in bed;with call bell/phone within reach;with nursing/sitter in room;with  family/visitor present  PT - Assessment/Plan  PT Plan Current plan remains appropriate  PT Frequency (ACUTE ONLY) 7X/week  Follow Up Recommendations Home health PT  PT equipment Rolling walker with 5" wheels  PT Goal Progression  Progress towards PT goals Progressing toward goals  PT General Charges  $$ ACUTE PT VISIT 1 Procedure  PT Treatments  $Gait Training 8-22 mins   Carmelia Bake, PT, DPT 04/30/2014 Pager: [phone]

## 2014-04-30 NOTE — Evaluation (Signed)
Occupational Therapy Evaluation Patient Details Name: [name] MRN: [id number] DOB: 14-Feb-1931 Today's Date: 04/30/2014    History of Present Illness R DA THA   Clinical Impression   This 81 year old female was admitted for the above surgery.  She will benefit from skilled OT to increase safety and independence with bathroom transfers.  Pt will have 24/7 assist and they will assist with adls as needed. Pt needs overall min A for mobility and goal for bathroom transfers is min A level    Follow Up Recommendations  Supervision/Assistance - 24 hour    Equipment Recommendations  3 in 1 bedside comode    Recommendations for Other Services       Precautions / Restrictions Precautions Precautions: Fall Restrictions Other Position/Activity Restrictions: WBAT      Mobility Bed Mobility         Supine to sit: Min assist        Transfers   Equipment used: Rolling walker (2 wheeled) Transfers: Sit to/from Stand Sit to Stand: Min assist         General transfer comment: cues for UE/LE placement    Balance                                            ADL Overall ADL's : Needs assistance/impaired             Lower Body Bathing: Minimal assistance;Sit to/from stand       Lower Body Dressing: Moderate assistance;Sit to/from stand   Toilet Transfer: Minimal assistance;Ambulation (to chair)             General ADL Comments: pt is able to perform UB adls with set up.  Able to ambulate without dizziness today.  Pt has catheter at this time.  Educated on AE, but pt prefers to have assistance from family.  She will benefit from 3:1 over toilet.  Will practice this on next visit.       Vision     Perception     Praxis      Pertinent Vitals/Pain Pain Score: 4  Pain Location: R hip Pain Descriptors / Indicators: Aching Pain Intervention(s): Limited activity within patient's tolerance;Monitored during session;Premedicated before  session;Repositioned     Hand Dominance Right   Extremity/Trunk Assessment Upper Extremity Assessment Upper Extremity Assessment: Overall WFL for tasks assessed           Communication Communication Communication: No difficulties   Cognition Arousal/Alertness: Awake/alert Behavior During Therapy: WFL for tasks assessed/performed Overall Cognitive Status: Within Functional Limits for tasks assessed                     General Comments       Exercises       Shoulder Instructions      Home Living Family/patient expects to be discharged to:: Private residence Living Arrangements: Alone Available Help at Discharge: Family;Personal care attendant Type of Home: House             Bathroom Shower/Tub: Occupational psychologist: Standard     Home Equipment: Shower seat   Additional Comments: will have 24/7 at home      Prior Functioning/Environment Level of Independence: Independent;Independent with assistive device(s)             OT Diagnosis: Generalized weakness   OT Problem List:  Decreased strength;Decreased activity tolerance;Decreased knowledge of use of DME or AE;Pain   OT Treatment/Interventions: Self-care/ADL training;DME and/or AE instruction;Patient/family education    OT Goals(Current goals can be found in the care plan section) Acute Rehab OT Goals Patient Stated Goal: Resume previous lifestyle with decreased pain:  pt exercises daily OT Goal Formulation: With patient Time For Goal Achievement: 05/07/14 Potential to Achieve Goals: Good ADL Goals Pt Will Transfer to Toilet: with min guard assist;ambulating;bedside commode Pt Will Perform Toileting - Clothing Manipulation and hygiene: with supervision;sit to/from stand Pt Will Perform Tub/Shower Transfer: Shower transfer;with min guard assist;ambulating;shower seat  OT Frequency: Min 2X/week   Barriers to D/C:            Co-evaluation PT/OT/SLP Co-Evaluation/Treatment:  Yes Reason for Co-Treatment: For patient/therapist safety          End of Session    Activity Tolerance: Patient tolerated treatment well Patient left: in chair;with call bell/phone within reach;with family/visitor present   Time: 4801-6553 OT Time Calculation (min): 21 min Charges:  OT General Charges $OT Visit: 1 Procedure OT Evaluation $Initial OT Evaluation Tier I: 1 Procedure G-Codes:    Bearett Porcaro 05-15-2014, 11:28 AM   Lesle Chris, OTR/L [phone] 05-15-14

## 2014-05-01 ENCOUNTER — Encounter (HOSPITAL_COMMUNITY): Payer: Self-pay | Admitting: Orthopaedic Surgery

## 2014-05-01 LAB — CBC
HCT: 32.4 % — ABNORMAL LOW (ref 36.0–46.0)
Hemoglobin: 10.7 g/dL — ABNORMAL LOW (ref 12.0–15.0)
MCH: 29.9 pg (ref 26.0–34.0)
MCHC: 33 g/dL (ref 30.0–36.0)
MCV: 90.5 fL (ref 78.0–100.0)
PLATELETS: 161 10*3/uL (ref 150–400)
RBC: 3.58 MIL/uL — ABNORMAL LOW (ref 3.87–5.11)
RDW: 13.6 % (ref 11.5–15.5)
WBC: 6.7 10*3/uL (ref 4.0–10.5)

## 2014-05-01 MED ORDER — LACTATED RINGERS IV SOLN: INTRAVENOUS | Status: DC

## 2014-05-01 MED ORDER — PHENYLEPHRINE HCL 10 MG/ML IJ SOLN: 30.0000 ug/min | INTRAVENOUS | Status: DC

## 2014-05-01 NOTE — Progress Notes (Signed)
Subjective: 3 Days Post-Op Procedure(s) (LRB): RIGHT TOTAL HIP ARTHROPLASTY ANTERIOR APPROACH (Right) Patient reports pain as moderate.  Better progress with therapy.  Wants to have BM.  H/H stable - acute blood loss anemia from surgery.  Objective: Vital signs in last 24 hours: Temp:  [98.9 F (37.2 C)-99.2 F (37.3 C)] 99.2 F (37.3 C) (03/14 0358) Pulse Rate:  [79-104] 104 (03/14 0358) Resp:  [16] 16 (03/14 0358) BP: (113-151)/(48-69) 151/69 mmHg (03/14 0358) SpO2:  [96 %] 96 % (03/14 0358)  Intake/Output from previous day: 03/13 0701 - 03/14 0700 In: 983.3 [P.O.:840; I.V.:143.3] Out: 2800 [Urine:2800] Intake/Output this shift:     Recent Labs  04/29/14 0440 04/30/14 0520 05/01/14 0505  HGB 6.9* 10.6* 10.7*    Recent Labs  04/30/14 0520 05/01/14 0505  WBC 6.9 6.7  RBC 3.50* 3.58*  HCT 31.3* 32.4*  PLT 135* 161    Recent Labs  04/29/14 0440  NA 132*  K 4.1  CL 101  CO2 27  BUN 18  CREATININE 0.85  GLUCOSE 92  CALCIUM 8.1*   No results for input(s): LABPT, INR in the last 72 hours.  Sensation intact distally Intact pulses distally Dorsiflexion/Plantar flexion intact Incision: scant drainage No cellulitis present Compartment soft  Assessment/Plan: 3 Days Post-Op Procedure(s) (LRB): RIGHT TOTAL HIP ARTHROPLASTY ANTERIOR APPROACH (Right) Up with therapy Discharge home with home health today.  [name] 05/01/2014, 7:26 AM

## 2014-05-01 NOTE — Progress Notes (Signed)
Physical Therapy Treatment Patient Details Name: JAMILLIA CLOSSON MRN: [id number] DOB: [birth year] Today's Date: 05/28/14    History of Present Illness R DA THA    PT Comments    Progressing well; dtr present for session  Follow Up Recommendations  Home health PT     Equipment Recommendations  Rolling walker with 5" wheels    Recommendations for Other Services OT consult     Precautions / Restrictions Precautions Precautions: Fall Restrictions Weight Bearing Restrictions: No Other Position/Activity Restrictions: WBAT    Mobility  Bed Mobility               General bed mobility comments: NT--up in room with nurse tech  Transfers Overall transfer level: Needs assistance Equipment used: Rolling walker (2 wheeled) Transfers: Sit to/from Stand Sit to Stand: Min guard;Supervision         General transfer comment: min/guard for safety cues for UE and LE positioning  Ambulation/Gait Ambulation/Gait assistance: Min guard;Supervision Ambulation Distance (Feet): 65 Feet Assistive device: Rolling walker (2 wheeled) Gait Pattern/deviations: Step-to pattern;Decreased step length - right;Decreased step length - left     General Gait Details: verbal cues for posture and RW positioning, step length, no dizziness   Stairs Stairs: Yes Stairs assistance: Min assist Stair Management: Backwards;With walker;Step to pattern Number of Stairs: 4 General stair comments: cues for  sequence and technique  Wheelchair Mobility    Modified Rankin (Stroke Patients Only)       Balance                                    Cognition Arousal/Alertness: Awake/alert Behavior During Therapy: WFL for tasks assessed/performed Overall Cognitive Status: Within Functional Limits for tasks assessed                      Exercises      General Comments        Pertinent Vitals/Pain Pain Assessment: 0-10 Pain Score: 5  Pain Location: R hip Pain  Descriptors / Indicators: Sore Pain Intervention(s): Limited activity within patient's tolerance;Monitored during session;Premedicated before session    Home Living                      Prior Function            PT Goals (current goals can now be found in the care plan section) Acute Rehab PT Goals Patient Stated Goal: Resume previous lifestyle with decreased pain:  pt exercises daily PT Goal Formulation: With patient Time For Goal Achievement: 05/05/14 Potential to Achieve Goals: Good Progress towards PT goals: Progressing toward goals    Frequency  7X/week    PT Plan Current plan remains appropriate    Co-evaluation             End of Session Equipment Utilized During Treatment: Gait belt Activity Tolerance: Patient tolerated treatment well Patient left: Other (comment);with call bell/phone within reach (in bathroom, dtr in room)     Time: 5277-8242 PT Time Calculation (min) (ACUTE ONLY): 25 min  Charges:  $Gait Training: 23-37 mins                    G Codes:      Kearra Calkin May 28, 2014, 11:32 AM

## 2014-05-01 NOTE — Progress Notes (Signed)
Occupational Therapy Treatment Patient Details Name: [name] MRN: [id number] DOB: [birth year] Today's Date: 05/01/2014    History of present illness R DA THA   OT comments  Pt making progress with functional goals and should continue with acute OT services to address impairments to increase level of function and safety  Follow Up Recommendations  Supervision/Assistance - 24 hour    Equipment Recommendations  3 in 1 bedside comode;Other (comment)    Recommendations for Other Services      Precautions / Restrictions Precautions Precautions: Fall Restrictions Weight Bearing Restrictions: No Other Position/Activity Restrictions: WBAT       Mobility Bed Mobility           Sit to supine: Min assist   General bed mobility comments: pt up with NT using RW coming from bathroom upon entering room. Min A with LEs back onto bed, pt able to sccot partially to [facility] then required assist from OT and nurse tech using pad underneath pt  Transfers Overall transfer level: Needs assistance Equipment used: Rolling walker (2 wheeled) Transfers: Sit to/from Stand Sit to Stand: Min guard;Supervision         General transfer comment: min/guard for safety cues for UE and LE positioning    Balance                                   ADL                           Toilet Transfer: Supervision/safety             General ADL Comments: reviewed DME for home use with pt. NT states that pt only required sup for toilet transfer before OT entered room      Vision  no change from baseline                   Perception Perception Perception Tested?: No   Praxis Praxis Praxis tested?: Not tested    Cognition   Behavior During Therapy: [facility] for tasks assessed/performed Overall Cognitive Status: Within Functional Limits for tasks assessed                       Extremity/Trunk Assessment   WFL                         General Comments  pt very pleasant and cooperative    Pertinent Vitals/ Pain       Pain Assessment: 0-10 Pain Score: 2  Pain Location: R hip Pain Descriptors / Indicators: Sore Pain Intervention(s): Limited activity within patient's tolerance;Monitored during session;Premedicated before session  Home Living  home alone                                        Prior Functioning/Environment  independent           Frequency Min 2X/week     Progress Toward Goals  OT Goals(current goals can now be found in the care plan section)  Progress towards OT goals: Progressing toward goals  Acute Rehab OT Goals Patient Stated Goal: Resume previous lifestyle with decreased pain:  pt exercises daily  Plan Discharge plan remains appropriate  End of Session Equipment Utilized During Treatment: Rolling walker   Activity Tolerance Patient tolerated treatment well   Patient Left with call bell/phone within reach;with family/visitor present;in bed             Time: 1131-1144 OT Time Calculation (min): 13 min  Charges: OT General Charges $OT Visit: 1 Procedure OT Treatments $Therapeutic Activity: 8-22 mins  Britt Bottom 05/01/2014, 1:09 PM

## 2014-05-01 NOTE — Discharge Summary (Signed)
Patient ID: [name] MRN: [id number] DOB/AGE: 1930/12/04 81 y.o.  Admit date: 04/28/2014 Discharge date: 05/01/2014  Admission Diagnoses:  Principal Problem:   Osteoarthritis of right hip Active Problems:   Status post total replacement of right hip   Discharge Diagnoses:  Same  Past Medical History  Diagnosis Date  . Iron deficiency anemia   . Sjogren's syndrome   . Abnormal ECG 11/2008    left bbb  . Raynaud's phenomenon 1965  . Arthritis   . Colon polyps   . Constipation     takes Colace daily  . GERD (gastroesophageal reflux disease)     takes Omeprazole daily  . Dry eyes     eye drops used   . Rheumatoid arthritis   . Back pain     scoliosis  . Osteoporosis   . Sciatica   . Cancer 2015    lung  . Lupus     takes Plaquenil daily  . PONV (postoperative nausea and vomiting)     likes phenergan    Surgeries: Procedure(s): RIGHT TOTAL HIP ARTHROPLASTY ANTERIOR APPROACH on 04/28/2014   Consultants:    Discharged Condition: Improved  Hospital Course: [name] is an 81 y.o. female who was admitted 04/28/2014 for operative treatment ofOsteoarthritis of right hip. Patient has severe unremitting pain that affects sleep, daily activities, and work/hobbies. After pre-op clearance the patient was taken to the operating room on 04/28/2014 and underwent  Procedure(s): RIGHT TOTAL HIP ARTHROPLASTY ANTERIOR APPROACH.    Patient was given perioperative antibiotics: Anti-infectives    Start     Dose/Rate Route Frequency Ordered Stop   04/28/14 1400  ceFAZolin (ANCEF) IVPB 1 g/50 mL premix     1 g 100 mL/hr over 30 Minutes Intravenous Every 6 hours 04/28/14 1052 04/28/14 1953   04/28/14 0556  ceFAZolin (ANCEF) IVPB 2 g/50 mL premix     2 g 100 mL/hr over 30 Minutes Intravenous On call to O.R. 04/28/14 0102 04/28/14 0752       Patient was given sequential compression devices, early ambulation, and chemoprophylaxis to prevent DVT.  Patient benefited  maximally from hospital stay and there were no complications.    Recent vital signs: Patient Vitals for the past 24 hrs:  BP Temp Temp src Pulse Resp SpO2  05/01/14 0358 (!) 151/69 mmHg 99.2 F (37.3 C) Oral (!) 104 16 96 %  04/30/14 1312 (!) 113/48 mmHg 98.9 F (37.2 C) Oral 79 16 96 %     Recent laboratory studies:  Recent Labs  04/29/14 0440 04/30/14 0520 05/01/14 0505  WBC 5.1 6.9 6.7  HGB 6.9* 10.6* 10.7*  HCT 21.0* 31.3* 32.4*  PLT 131* 135* 161  NA 132*  --   --   K 4.1  --   --   CL 101  --   --   CO2 27  --   --   BUN 18  --   --   CREATININE 0.85  --   --   GLUCOSE 92  --   --   CALCIUM 8.1*  --   --      Discharge Medications:     Medication List    STOP taking these medications        acetaminophen 500 MG tablet  Commonly known as:  TYLENOL      TAKE these medications        alum & mag hydroxide-simeth 200-200-20 MG/5ML suspension  Commonly known as:  MAALOX/MYLANTA  Take  30 mLs by mouth every 4 (four) hours as needed for indigestion.     Biotin 1000 MCG tablet  Take 1,000 mcg by mouth daily.     CALCIUM 500 + D 500-125 MG-UNIT Tabs  Generic drug:  Calcium Carbonate-Vitamin D  Take 1 tablet by mouth daily.     ESTRACE VAGINAL 0.1 MG/GM vaginal cream  Generic drug:  estradiol  USE AS DIRECTED     gabapentin 600 MG tablet  Commonly known as:  NEURONTIN  Take 600 mg by mouth 3 (three) times daily.     GLUCOSAMINE CHONDR 1500 COMPLX PO  Take 1 capsule by mouth daily.     HYDROcodone-acetaminophen 5-325 MG per tablet  Commonly known as:  NORCO/VICODIN  Take 1 tablet by mouth 2 (two) times daily as needed for moderate pain.     meloxicam 15 MG tablet  Commonly known as:  MOBIC  Take 15 mg by mouth daily.     multivitamin tablet  Take 1 tablet by mouth daily.     omeprazole 20 MG capsule  Commonly known as:  PRILOSEC  TAKE 1 CAPSULE (20 MG TOTAL) BY MOUTH 2 (TWO) TIMES DAILY BEFORE A MEAL.     polyethylene glycol powder powder   Commonly known as:  GLYCOLAX/MIRALAX  Take 17 g by mouth 2 (two) times daily as needed.     rivaroxaban 10 MG Tabs tablet  Commonly known as:  XARELTO  Take 1 tablet (10 mg total) by mouth daily with breakfast.     STOOL SOFTENER 100 MG capsule  Generic drug:  docusate sodium  Take 100 mg by mouth 2 (two) times daily.        Diagnostic Studies: Dg C-arm 61-120 Min-no Report  04/28/2014   CLINICAL DATA: surgery   C-ARM 61-120 MINUTES  Fluoroscopy was utilized by the requesting physician.  No radiographic  interpretation.    Dg Hip Port Unilat With Pelvis 1v Right  04/28/2014   CLINICAL DATA:  Status post right hip arthroplasty.  EXAM: RIGHT HIP (WITH PELVIS) 1 VIEW PORTABLE  COMPARISON:  Intraoperative films earlier today.  FINDINGS: Frontal pelvic film and cross-table lateral film of the proximal right femur shows normal alignment of a total hip arthroplasty. No fracture or dislocation is seen. The bony pelvis appears intact.  IMPRESSION: Normal appearance and alignment status post right hip arthroplasty.   Electronically Signed   By: Aletta Edouard M.D.   On: 04/28/2014 10:49   Dg Hip Unilat With Pelvis 2-3 Views Right  04/28/2014   CLINICAL DATA:  Hip replacement.  EXAM: RIGHT HIP (WITH PELVIS) 2-3 VIEWS  COMPARISON:  None.  FINDINGS: Total right hip replacement with good anatomic alignment. 0 minutes 38 seconds fluoroscopy time. Two images obtained.  IMPRESSION: Total right hip replacement with good anatomic alignment.   Electronically Signed   By: Marcello Moores  Register   On: 04/28/2014 09:15    Disposition: 01-Home or Self Care      Discharge Instructions    Discharge patient    Complete by:  As directed      Discharge wound care:    Complete by:  As directed   Keep dressing clean and intact . May shower with dressing intact.     Weight bearing as tolerated    Complete by:  As directed   Laterality:  right  Extremity:  Lower           Follow-up Information    Follow up  with [name],  MD In 2 weeks.   Specialty:  Orthopedic Surgery   Contact information:   [place] [address] [phone]        Signed: [name] 05/01/2014, 7:29 AM

## 2014-05-09 ENCOUNTER — Encounter (HOSPITAL_COMMUNITY): Admission: RE | Payer: Self-pay | Source: Ambulatory Visit

## 2014-05-09 ENCOUNTER — Inpatient Hospital Stay (HOSPITAL_COMMUNITY): Admission: RE | Admit: 2014-05-09 | Payer: Medicare PPO | Source: Ambulatory Visit | Admitting: Orthopedic Surgery

## 2014-05-09 SURGERY — ARTHROPLASTY, HIP, TOTAL,POSTERIOR APPROACH
Anesthesia: General | Site: Hip | Laterality: Right

## 2014-05-11 ENCOUNTER — Other Ambulatory Visit (HOSPITAL_COMMUNITY): Payer: Self-pay | Admitting: Orthopaedic Surgery

## 2014-05-11 ENCOUNTER — Ambulatory Visit (HOSPITAL_COMMUNITY)
Admission: RE | Admit: 2014-05-11 | Discharge: 2014-05-11 | Disposition: A | Discharge status: Home / Self Care | Payer: Medicare PPO | Source: Ambulatory Visit | Attending: Orthopaedic Surgery | Admitting: Orthopaedic Surgery

## 2014-05-11 DIAGNOSIS — M25561 Pain in right knee: Secondary | ICD-10-CM

## 2014-05-11 DIAGNOSIS — M7989 Other specified soft tissue disorders: Secondary | ICD-10-CM

## 2014-05-11 DIAGNOSIS — M79604 Pain in right leg: Secondary | ICD-10-CM | POA: Diagnosis present

## 2014-05-11 NOTE — Progress Notes (Signed)
*  PRELIMINARY RESULTS* Vascular Ultrasound Right lower extremity venous duplex has been completed.  Preliminary findings: No evidence of DVT.  Called results to [facility] at Dr. Trevor Mace office.  Landry Mellow, RDMS, RVT  05/11/2014, 4:49 PM

## 2014-05-15 ENCOUNTER — Ambulatory Visit: Payer: Medicare PPO | Admitting: Family Medicine

## 2014-05-16 ENCOUNTER — Telehealth: Payer: Self-pay | Admitting: Family Medicine

## 2014-05-16 NOTE — Telephone Encounter (Signed)
Pt scheduled a hip replacement surgery appointment for 05/30/14  (15 minute time slot is this ok)

## 2014-05-17 NOTE — Telephone Encounter (Signed)
Ok

## 2014-05-17 NOTE — Telephone Encounter (Signed)
Scheduled appointment for 05/30/2014 at 11am (30 minute time slot)

## 2014-05-17 NOTE — Telephone Encounter (Signed)
Normally this is a 30 minute appt. Ok to put two 15 minute appts together if we need to.

## 2014-05-30 ENCOUNTER — Ambulatory Visit: Payer: Medicare PPO | Admitting: Family Medicine

## 2014-05-30 ENCOUNTER — Ambulatory Visit (INDEPENDENT_AMBULATORY_CARE_PROVIDER_SITE_OTHER): Payer: Medicare PPO | Admitting: Family Medicine

## 2014-05-30 ENCOUNTER — Encounter: Payer: Self-pay | Admitting: Family Medicine

## 2014-05-30 VITALS — BP 132/66 | HR 75 | Temp 97.9°F | Resp 16 | Wt 102.1 lb

## 2014-05-30 DIAGNOSIS — M069 Rheumatoid arthritis, unspecified: Secondary | ICD-10-CM

## 2014-05-30 DIAGNOSIS — Z96641 Presence of right artificial hip joint: Secondary | ICD-10-CM

## 2014-05-30 DIAGNOSIS — C3411 Malignant neoplasm of upper lobe, right bronchus or lung: Secondary | ICD-10-CM

## 2014-05-30 DIAGNOSIS — Z96649 Presence of unspecified artificial hip joint: Secondary | ICD-10-CM | POA: Diagnosis not present

## 2014-05-30 MED ORDER — MELOXICAM 15 MG PO TABS: 15.0000 mg | ORAL_TABLET | Freq: Every day | ORAL | Status: DC

## 2014-05-30 NOTE — Assessment & Plan Note (Signed)
Chronic problem.  Deteriorated recently.  Started on Methotrexate in place of Plaquenil due to macular degeneration.  Pt is on appropriate folic acid supplementation and getting regular labs w/ rheum.  Will follow along and assist as able

## 2014-05-30 NOTE — Assessment & Plan Note (Signed)
New to provider.  Pt is doing very well since her surgery.  Continues to have some RLE edema but discussed that this is to be expected.  Pt is now able to ambulate w/o cane.

## 2014-05-30 NOTE — Patient Instructions (Signed)
Schedule your complete physical after 9/25 We will be more than happy to fill out any forms that you need Call with any questions or concerns You look great!  Keep up the good work! Happy Spring!!!

## 2014-05-30 NOTE — Progress Notes (Signed)
   Subjective:    Patient ID: [name], female    DOB: 10/05/1930, 81 y.o.   MRN: [id number]  HPI S/p R hip replacement- surgery on 3/11 w/ Dr Ninfa Linden.  Pt is ambulating well- no longer using a cane.  Pt is considering using some of her long term care insurance.  Still having R LE edema.  RA- chronic problem, following w/ Dr Earnest Conroy.  Now on Methotrexate for diffuse arthritic change- low back, hands, shoulders.  Lung Cancer- following w/ Dr Servando Snare and Dr Inda Merlin and having CT scans q6 months.   Review of Systems For ROS see HPI     Objective:   Physical Exam  Constitutional: She is oriented to person, place, and time. She appears well-developed and well-nourished. No distress.  HENT:  Head: Normocephalic and atraumatic.  Cardiovascular: Normal rate, regular rhythm, normal heart sounds and intact distal pulses.   Pulmonary/Chest: Effort normal and breath sounds normal. No respiratory distress. She has no wheezes. She has no rales.  Musculoskeletal: She exhibits edema (1+ edema of RLE).  Neurological: She is alert and oriented to person, place, and time.  Skin: Skin is warm and dry.  Psychiatric: She has a normal mood and affect. Her behavior is normal. Thought content normal.  Vitals reviewed.         Assessment & Plan:

## 2014-05-30 NOTE — Assessment & Plan Note (Signed)
Chronic problem.  Pt is having CT scans q6 months to assess for recurrence or mets.  Currently feeling well.  Will follow along and assist as able.

## 2014-05-30 NOTE — Progress Notes (Signed)
Pre visit review using our clinic review tool, if applicable. No additional management support is needed unless otherwise documented below in the visit note. 

## 2014-07-29 ENCOUNTER — Encounter (HOSPITAL_BASED_OUTPATIENT_CLINIC_OR_DEPARTMENT_OTHER): Payer: Self-pay | Admitting: *Deleted

## 2014-07-29 ENCOUNTER — Emergency Department (HOSPITAL_BASED_OUTPATIENT_CLINIC_OR_DEPARTMENT_OTHER): Payer: Medicare PPO

## 2014-07-29 ENCOUNTER — Emergency Department (HOSPITAL_BASED_OUTPATIENT_CLINIC_OR_DEPARTMENT_OTHER)
Admission: EM | Admit: 2014-07-29 | Discharge: 2014-07-29 | Disposition: A | Discharge status: Home / Self Care | Payer: Medicare PPO | Attending: Emergency Medicine | Admitting: Emergency Medicine

## 2014-07-29 DIAGNOSIS — Z791 Long term (current) use of non-steroidal anti-inflammatories (NSAID): Secondary | ICD-10-CM | POA: Insufficient documentation

## 2014-07-29 DIAGNOSIS — Z79899 Other long term (current) drug therapy: Secondary | ICD-10-CM | POA: Insufficient documentation

## 2014-07-29 DIAGNOSIS — K5900 Constipation, unspecified: Secondary | ICD-10-CM | POA: Insufficient documentation

## 2014-07-29 DIAGNOSIS — R112 Nausea with vomiting, unspecified: Secondary | ICD-10-CM | POA: Insufficient documentation

## 2014-07-29 DIAGNOSIS — Z862 Personal history of diseases of the blood and blood-forming organs and certain disorders involving the immune mechanism: Secondary | ICD-10-CM | POA: Insufficient documentation

## 2014-07-29 DIAGNOSIS — Z85118 Personal history of other malignant neoplasm of bronchus and lung: Secondary | ICD-10-CM | POA: Insufficient documentation

## 2014-07-29 DIAGNOSIS — Z8669 Personal history of other diseases of the nervous system and sense organs: Secondary | ICD-10-CM | POA: Diagnosis not present

## 2014-07-29 DIAGNOSIS — K219 Gastro-esophageal reflux disease without esophagitis: Secondary | ICD-10-CM | POA: Insufficient documentation

## 2014-07-29 DIAGNOSIS — Z86010 Personal history of colonic polyps: Secondary | ICD-10-CM | POA: Diagnosis not present

## 2014-07-29 DIAGNOSIS — Z8679 Personal history of other diseases of the circulatory system: Secondary | ICD-10-CM | POA: Insufficient documentation

## 2014-07-29 DIAGNOSIS — M069 Rheumatoid arthritis, unspecified: Secondary | ICD-10-CM | POA: Insufficient documentation

## 2014-07-29 DIAGNOSIS — R197 Diarrhea, unspecified: Secondary | ICD-10-CM | POA: Diagnosis not present

## 2014-07-29 DIAGNOSIS — R531 Weakness: Secondary | ICD-10-CM | POA: Diagnosis present

## 2014-07-29 LAB — URINALYSIS, ROUTINE W REFLEX MICROSCOPIC
Bilirubin Urine: NEGATIVE
Glucose, UA: NEGATIVE mg/dL
HGB URINE DIPSTICK: NEGATIVE
Ketones, ur: NEGATIVE mg/dL
Leukocytes, UA: NEGATIVE
NITRITE: NEGATIVE
PROTEIN: NEGATIVE mg/dL
Specific Gravity, Urine: 1.013 (ref 1.005–1.030)
Urobilinogen, UA: 0.2 mg/dL (ref 0.0–1.0)
pH: 7.5 (ref 5.0–8.0)

## 2014-07-29 LAB — COMPREHENSIVE METABOLIC PANEL
ALT: 15 U/L (ref 14–54)
ANION GAP: 5 (ref 5–15)
AST: 31 U/L (ref 15–41)
Albumin: 4.2 g/dL (ref 3.5–5.0)
Alkaline Phosphatase: 51 U/L (ref 38–126)
BILIRUBIN TOTAL: 0.3 mg/dL (ref 0.3–1.2)
BUN: 23 mg/dL — AB (ref 6–20)
CHLORIDE: 99 mmol/L — AB (ref 101–111)
CO2: 26 mmol/L (ref 22–32)
Calcium: 8.9 mg/dL (ref 8.9–10.3)
Creatinine, Ser: 0.84 mg/dL (ref 0.44–1.00)
GLUCOSE: 120 mg/dL — AB (ref 65–99)
Potassium: 4.3 mmol/L (ref 3.5–5.1)
Sodium: 130 mmol/L — ABNORMAL LOW (ref 135–145)
TOTAL PROTEIN: 7.3 g/dL (ref 6.5–8.1)

## 2014-07-29 LAB — CBC
HEMATOCRIT: 32.9 % — AB (ref 36.0–46.0)
Hemoglobin: 10.6 g/dL — ABNORMAL LOW (ref 12.0–15.0)
MCH: 30.1 pg (ref 26.0–34.0)
MCHC: 32.2 g/dL (ref 30.0–36.0)
MCV: 93.5 fL (ref 78.0–100.0)
Platelets: 150 10*3/uL (ref 150–400)
RBC: 3.52 MIL/uL — ABNORMAL LOW (ref 3.87–5.11)
RDW: 13.7 % (ref 11.5–15.5)
WBC: 4.9 10*3/uL (ref 4.0–10.5)

## 2014-07-29 MED ORDER — SODIUM CHLORIDE 0.9 % IV BOLUS (SEPSIS)
1000.0000 mL | Freq: Once | INTRAVENOUS | Status: AC
  Administered 2014-07-29: 1000 mL via INTRAVENOUS

## 2014-07-29 MED ORDER — ONDANSETRON HCL 8 MG PO TABS: 8.0000 mg | ORAL_TABLET | Freq: Three times a day (TID) | ORAL | Status: DC | PRN

## 2014-07-29 MED ORDER — ONDANSETRON HCL 4 MG/2ML IJ SOLN
4.0000 mg | Freq: Once | INTRAMUSCULAR | Status: AC
  Administered 2014-07-29: 4 mg via INTRAVENOUS
  Filled 2014-07-29: qty 2

## 2014-07-29 NOTE — ED Provider Notes (Signed)
CSN: [id number]     Arrival date & time 07/29/14  7106 History   First MD Initiated Contact with Patient 07/29/14 0920     Chief Complaint  Patient presents with  . Weakness     (Consider location/radiation/quality/duration/timing/severity/associated sxs/prior Treatment) HPI Patient developed vomiting nausea and diarrhea onset 3 AM today she's had a presently 5 episodes of nonbloody diarrhea and 4 or 5 episodes of vomiting, nonbloody. Associated symptoms include generalized weakness worse with standing, improved with lying down. No known fever. She denies abdominal pain no treatment prior to coming here. No recent travel, no recent anti-biotic use. No other associated symptoms Past Medical History  Diagnosis Date  . Iron deficiency anemia   . Sjogren's syndrome   . Abnormal ECG 11/2008    left bbb  . Raynaud's phenomenon 1965  . Arthritis   . Colon polyps   . Constipation     takes Colace daily  . GERD (gastroesophageal reflux disease)     takes Omeprazole daily  . Dry eyes     eye drops used   . Rheumatoid arthritis   . Back pain     scoliosis  . Osteoporosis   . Sciatica   . Cancer 2015    lung  . Lupus     takes Plaquenil daily  . PONV (postoperative nausea and vomiting)     likes phenergan   Past Surgical History  Procedure Laterality Date  . Appendectomy    . Tonsillectomy    . Total abdominal hysterectomy  1980's   . Thoracic sympathetectomy  1965  . Cataract extraction Bilateral   . Colonoscopy    . Cardiovascular stress test      12/2008 mild fixed basal to mid septal perfusion defect felt likely due to artifact from LBBB, no ischemia, EF 58%  . Video bronchoscopy with endobronchial navigation N/A 05/04/2013    Procedure: VIDEO BRONCHOSCOPY WITH ENDOBRONCHIAL NAVIGATION;  Surgeon: [name], MD;  Location: [place];  Service: Thoracic;  Laterality: N/A;  . Video bronchoscopy N/A 06/07/2013    Procedure: VIDEO BRONCHOSCOPY;  Surgeon: [name],  MD;  Location: [facility] OR;  Service: Thoracic;  Laterality: N/A;  . Video assisted thoracoscopy (vats)/wedge resection Right 06/07/2013    Procedure: VIDEO ASSISTED THORACOSCOPY (VATS)/right upper lobectomy, On Q;  Surgeon: [name], MD;  Location: [place];  Service: Thoracic;  Laterality: Right;  . Thoracotomy  06/07/2013    Procedure: MINI/LIMITED THORACOTOMY; right middle lobe wedge resection;  Surgeon: [name], MD;  Location: [facility] OR;  Service: Thoracic;;  . Lymph node dissection Right 06/07/2013    Procedure: LYMPH NODE DISSECTION;  Surgeon: [name], MD;  Location: [place];  Service: Thoracic;  Laterality: Right;  . Total hip arthroplasty Right 04/28/2014    Procedure: RIGHT TOTAL HIP ARTHROPLASTY ANTERIOR APPROACH;  Surgeon: [name], MD;  Location: [facility];  Service: Orthopedics;  Laterality: Right;   hysterectomy Family History  Problem Relation Age of Onset  . Coronary artery disease Father   . Colon cancer Father   . Diabetes Father   . Esophageal cancer Neg Hx   . Kidney disease Neg Hx   . Liver disease Neg Hx    History  Substance Use Topics  . Smoking status: Never Smoker   . Smokeless tobacco: Never Used  . Alcohol Use: Yes     Comment: rare/social   OB History    No data available     Review  of Systems  HENT: Negative.   Respiratory: Negative.   Cardiovascular: Negative.   Gastrointestinal: Positive for nausea, vomiting and diarrhea.  Musculoskeletal: Negative.   Skin: Negative.   Allergic/Immunologic: Positive for immunocompromised state.       History of cancer  Neurological: Positive for weakness.  Psychiatric/Behavioral: Negative.   All other systems reviewed and are negative.     Allergies  Prochlorperazine edisylate and Aspirin  Home Medications   Prior to Admission medications   Medication Sig Start Date End Date Taking? Authorizing Provider  alum & mag hydroxide-simeth (MAALOX/MYLANTA) 200-200-20 MG/5ML suspension  Take 30 mLs by mouth every 4 (four) hours as needed for indigestion. 04/30/14   Pete Pelt, PA-C  Biotin 1000 MCG tablet Take 1,000 mcg by mouth daily.     Historical Provider, MD  Calcium Carbonate-Vitamin D (CALCIUM 500 + D) 500-125 MG-UNIT TABS Take 1 tablet by mouth daily.     Historical Provider, MD  docusate sodium (STOOL SOFTENER) 100 MG capsule Take 100 mg by mouth 2 (two) times daily.      Historical Provider, MD  ESTRACE VAGINAL 0.1 MG/GM vaginal cream USE AS DIRECTED 01/19/14   Midge Minium, MD  folic acid (FOLVITE) 1 MG tablet Take 1 mg by mouth daily.    Historical Provider, MD  gabapentin (NEURONTIN) 600 MG tablet Take 600 mg by mouth 3 (three) times daily.    Historical Provider, MD  Glucosamine-Chondroit-Vit C-Mn (GLUCOSAMINE CHONDR 1500 COMPLX PO) Take 1 capsule by mouth daily.     Historical Provider, MD  HYDROcodone-acetaminophen (NORCO/VICODIN) 5-325 MG per tablet Take 1 tablet by mouth 2 (two) times daily as needed for moderate pain. 04/30/14   Pete Pelt, PA-C  meloxicam (MOBIC) 15 MG tablet Take 1 tablet (15 mg total) by mouth daily. 05/30/14   Midge Minium, MD  methotrexate (RHEUMATREX) 2.5 MG tablet Take 7.5 mg by mouth once a week. Caution:Chemotherapy. Protect from light.    Historical Provider, MD  Multiple Vitamin (MULTIVITAMIN) tablet Take 1 tablet by mouth daily.      Historical Provider, MD  omeprazole (PRILOSEC) 20 MG capsule TAKE 1 CAPSULE (20 MG TOTAL) BY MOUTH 2 (TWO) TIMES DAILY BEFORE A MEAL. 04/17/14   Midge Minium, MD   patient no longer on gabapentin, recently started on Cymbalta Aldactone BP 147/70 mmHg  Pulse 80  Temp(Src) 97.9 F (36.6 C) (Oral)  Resp 18  SpO2 98% Physical Exam  Constitutional: She appears well-developed and well-nourished. No distress.  HENT:  Head: Normocephalic and atraumatic.  Mucous members dry  Eyes: Conjunctivae are normal. Pupils are equal, round, and reactive to light.  Neck: Neck supple. No  tracheal deviation present. No thyromegaly present.  Cardiovascular: Normal rate and regular rhythm.   No murmur heard. Pulmonary/Chest: Effort normal and breath sounds normal.  Abdominal: Soft. Bowel sounds are normal. She exhibits no distension. There is no tenderness.  Musculoskeletal: Normal range of motion. She exhibits no edema or tenderness.  Neurological: She is alert. Coordination normal.  Skin: Skin is warm and dry. No rash noted.  Psychiatric: She has a normal mood and affect.  Nursing note and vitals reviewed.   ED Course  Procedures (including critical care time) Labs Review Labs Reviewed - No data to display  Imaging Review No results found.   EKG Interpretation None     11:40 AM patient remains slightly lightheaded with standing and becomes nauseated with standing. Second liter of intravenous normal saline ordered and Zofran  IV ordered X-rays viewed by me.  1:20 PM patient feels much improved after treatment with intravenous hydration and intravenous Zofran. She is not lightheaded on standing. She is able to drink without nausea or vomiting. Results for orders placed or performed during the hospital encounter of 07/29/14  Comprehensive metabolic panel  Result Value Ref Range   Sodium 130 (L) 135 - 145 mmol/L   Potassium 4.3 3.5 - 5.1 mmol/L   Chloride 99 (L) 101 - 111 mmol/L   CO2 26 22 - 32 mmol/L   Glucose, Bld 120 (H) 65 - 99 mg/dL   BUN 23 (H) 6 - 20 mg/dL   Creatinine, Ser 0.84 0.44 - 1.00 mg/dL   Calcium 8.9 8.9 - 10.3 mg/dL   Total Protein 7.3 6.5 - 8.1 g/dL   Albumin 4.2 3.5 - 5.0 g/dL   AST 31 15 - 41 U/L   ALT 15 14 - 54 U/L   Alkaline Phosphatase 51 38 - 126 U/L   Total Bilirubin 0.3 0.3 - 1.2 mg/dL   GFR calc non Af Amer >60 >60 mL/min   GFR calc Af Amer >60 >60 mL/min   Anion gap 5 5 - 15  CBC  Result Value Ref Range   WBC 4.9 4.0 - 10.5 K/uL   RBC 3.52 (L) 3.87 - 5.11 MIL/uL   Hemoglobin 10.6 (L) 12.0 - 15.0 g/dL   HCT 32.9 (L) 36.0 -  46.0 %   MCV 93.5 78.0 - 100.0 fL   MCH 30.1 26.0 - 34.0 pg   MCHC 32.2 30.0 - 36.0 g/dL   RDW 13.7 11.5 - 15.5 %   Platelets 150 150 - 400 K/uL  Urinalysis, Routine w reflex microscopic (not at [facility])  Result Value Ref Range   Color, Urine YELLOW YELLOW   APPearance CLEAR CLEAR   Specific Gravity, Urine 1.013 1.005 - 1.030   pH 7.5 5.0 - 8.0   Glucose, UA NEGATIVE NEGATIVE mg/dL   Hgb urine dipstick NEGATIVE NEGATIVE   Bilirubin Urine NEGATIVE NEGATIVE   Ketones, ur NEGATIVE NEGATIVE mg/dL   Protein, ur NEGATIVE NEGATIVE mg/dL   Urobilinogen, UA 0.2 0.0 - 1.0 mg/dL   Nitrite NEGATIVE NEGATIVE   Leukocytes, UA NEGATIVE NEGATIVE   Dg Abd Acute W/chest  07/29/2014   CLINICAL DATA:  Nausea and vomiting for 1 day, recent new medication  EXAM: DG ABDOMEN ACUTE W/ 1V CHEST  COMPARISON:  09/08/2013  FINDINGS: Cardiac shadow is stable. The lungs are well aerated bilaterally. No focal infiltrate or sizable effusion is seen. Postsurgical changes are noted in the right hilum.  The abdomen shows a nonobstructive bowel gas pattern. No abnormal mass or abnormal calcifications are seen. Chronic scoliosis of the lumbar spine is seen. Postsurgical change in the right hip is noted.  IMPRESSION: Chronic changes without acute abnormality.   Electronically Signed   By: Inez Catalina M.D.   On: 07/29/2014 10:26    MDM  . Anemia and hypontremia are chronicPlan stop Cymbalta at least temporarily.. Patient has read that Cymbalta can cause nausea vomiting and diarrhea. Also advised her hold Aldactone for today and tomorrow. Encourage oral hydration F/u Dr. Birdie Riddle Final diagnoses:  None   Dx #1 Nausea, vomiting and diarrhea #2 dehydration (mild). #3 anemia #4 hyponatremia #5 anemia    Orlie Dakin, MD 07/29/14 1334

## 2014-07-29 NOTE — Discharge Instructions (Signed)
Take the medication prescribed as needed for nausea or vomiting. Take Imodium as directed for diarrhea. Drink at least six 8 ounce glasses of water or Gatorade each day. Stop Cymbalta until further advised by Dr. Birdie Riddle. Do not take any Aldactone today or tomorrow. You can resume taking Aldactone on Monday, 07/31/2014 Call Dr. Birdie Riddle in 2 days to schedule an office visit to go over your medications. Return if your condition worsens for any reason.

## 2014-07-29 NOTE — ED Notes (Signed)
Patient transported to X-ray, via Biomedical scientist

## 2014-07-29 NOTE — ED Notes (Signed)
MD at bedside. 

## 2014-07-29 NOTE — ED Notes (Signed)
Up to bathroom with assistance.

## 2014-07-29 NOTE — ED Notes (Signed)
Pt presents with neighbor with c/o N/V with weakness, onset 0300hrs

## 2014-07-29 NOTE — ED Notes (Signed)
Friend at bedside.

## 2014-07-31 ENCOUNTER — Telehealth: Payer: Self-pay | Admitting: General Practice

## 2014-07-31 NOTE — Telephone Encounter (Signed)
Can you please cal and see how pt is feeling? i have no openings today, but can have her come in at 11am tomorrow if ok?

## 2014-07-31 NOTE — Telephone Encounter (Signed)
-----   Message from Midge Minium, MD sent at 07/31/2014  8:02 AM EDT ----- Please make sure she gets scheduled  Thanks!  ----- Message -----    From: Orlie Dakin, MD    Sent: 07/29/2014   1:30 PM      To: Midge Minium, MD  Hello: [name] came to [facility] emergency department today with vomiting and diarrhea and generalized weakness. We administered intravenous hydration and Zofran. She felt much improved upon discharge from here I wrote a prescription for Zofran. I advised her to hold her Cymbalta, as she had read that Cymbalta can cause vomiting and diarrhea. I also advised her to hold Aldactone for today and tomorrow and to call you on Monday, June 13 to schedule an appointment to go over her medications. Thanks, Orlie Dakin, MD

## 2014-07-31 NOTE — Telephone Encounter (Signed)
Patient states she is feeling better, no vomiting, but does report some residual "queeziness."  She has been eating a bland diet.  She denies dizziness.  She states she felt like she did when she had a UTI previously, no fevers and ED stated UA was normal.  Patient states she has been taken off of Cymbalta and Aldactone (prescribed by Rheumatologist and Neurologist).  She has been staying hydrated.   Follow-up scheduled tomorrow 08/01/14 at 8:45 with Dr. Birdie Riddle.

## 2014-08-01 ENCOUNTER — Encounter: Payer: Self-pay | Admitting: Family Medicine

## 2014-08-01 ENCOUNTER — Telehealth: Payer: Self-pay | Admitting: Family Medicine

## 2014-08-01 ENCOUNTER — Ambulatory Visit (INDEPENDENT_AMBULATORY_CARE_PROVIDER_SITE_OTHER): Payer: Medicare PPO | Admitting: Family Medicine

## 2014-08-01 VITALS — BP 150/88 | HR 74 | Temp 98.3°F | Resp 16 | Wt 105.1 lb

## 2014-08-01 DIAGNOSIS — R030 Elevated blood-pressure reading, without diagnosis of hypertension: Secondary | ICD-10-CM

## 2014-08-01 DIAGNOSIS — R600 Localized edema: Secondary | ICD-10-CM | POA: Insufficient documentation

## 2014-08-01 DIAGNOSIS — IMO0001 Reserved for inherently not codable concepts without codable children: Secondary | ICD-10-CM | POA: Insufficient documentation

## 2014-08-01 DIAGNOSIS — E860 Dehydration: Secondary | ICD-10-CM

## 2014-08-01 LAB — CBC WITH DIFFERENTIAL/PLATELET
BASOS PCT: 0.7 % (ref 0.0–3.0)
Basophils Absolute: 0 10*3/uL (ref 0.0–0.1)
EOS ABS: 0 10*3/uL (ref 0.0–0.7)
Eosinophils Relative: 0.6 % (ref 0.0–5.0)
HCT: 32.5 % — ABNORMAL LOW (ref 36.0–46.0)
HEMOGLOBIN: 10.8 g/dL — AB (ref 12.0–15.0)
Lymphocytes Relative: 23.3 % (ref 12.0–46.0)
Lymphs Abs: 1 10*3/uL (ref 0.7–4.0)
MCHC: 33.2 g/dL (ref 30.0–36.0)
MCV: 90.9 fl (ref 78.0–100.0)
MONO ABS: 0.4 10*3/uL (ref 0.1–1.0)
Monocytes Relative: 9.7 % (ref 3.0–12.0)
NEUTROS ABS: 2.9 10*3/uL (ref 1.4–7.7)
NEUTROS PCT: 65.7 % (ref 43.0–77.0)
Platelets: 173 10*3/uL (ref 150.0–400.0)
RBC: 3.58 Mil/uL — AB (ref 3.87–5.11)
RDW: 14.7 % (ref 11.5–15.5)
WBC: 4.5 10*3/uL (ref 4.0–10.5)

## 2014-08-01 LAB — BASIC METABOLIC PANEL
BUN: 18 mg/dL (ref 6–23)
CHLORIDE: 98 meq/L (ref 96–112)
CO2: 29 meq/L (ref 19–32)
Calcium: 9.3 mg/dL (ref 8.4–10.5)
Creatinine, Ser: 0.92 mg/dL (ref 0.40–1.20)
GFR: 62.16 mL/min (ref >60.00)
Glucose, Bld: 75 mg/dL (ref 70–99)
Potassium: 4.2 mEq/L (ref 3.5–5.1)
SODIUM: 132 meq/L — AB (ref 135–145)

## 2014-08-01 NOTE — Assessment & Plan Note (Signed)
New.  Pt has not hx of HTN.  May be due to fact she stopped her spironolactone or that she still isn't feeling well.  Will not start new meds at this time but will have pt restart spironolactone and return next week for recheck.

## 2014-08-01 NOTE — Telephone Encounter (Signed)
Pt unable to make appt 08/09/14 11:30am that you scheduled for her. She said that she needs to reschedule due to going to the cancer center. Please call her back on home # to reschedule since a spot will have to be opened or approved to schedule.

## 2014-08-01 NOTE — Telephone Encounter (Signed)
Rescheduled for 2:45pm 08/09/14 same day slot per Jess T

## 2014-08-01 NOTE — Progress Notes (Signed)
Pre visit review using our clinic review tool, if applicable. No additional management support is needed unless otherwise documented below in the visit note. 

## 2014-08-01 NOTE — Assessment & Plan Note (Signed)
New.  Pt was clearly dehydrated at time of ER visit and required 2-3 bags of fluid.  Recheck BMP today to assess electrolytes in setting of mild volume overload at this time.

## 2014-08-01 NOTE — Assessment & Plan Note (Signed)
New.  Suspect this is due to pt stopping her Spironolactone, her recent anemia, and recent hip surgery.  Restart Spironolactone.  Encouraged increased fluid intake.  Check labs- treat abnormalities as they arise.  Pt denies CP, SOB- no obvious cardiac etiology.  Will follow closely.

## 2014-08-01 NOTE — Telephone Encounter (Signed)
Ok  Use any same days either on Wednesday or Thursday for pt to be seen. Per Birdie Riddle today pt has to be seen on of those days.

## 2014-08-01 NOTE — Patient Instructions (Signed)
Follow up on Wednesday 6/22 at 11:30 to recheck BP and swelling We'll notify you of your lab results and make any changes if needed Restart Spironolactone daily Continue to eat and drink to avoid dehydration and fatigue Elevate your legs as you are able If you need anything in the next week- please call and someone will assist you until I get back! Call with any questions or concerns Hang in there!!!

## 2014-08-01 NOTE — Progress Notes (Signed)
   Subjective:    Patient ID: [name], female    DOB: [birth year], 81 y.o.   MRN: [id number]  HPI ER f/u- Martin Majestic to ER on 6/11 for nausea and vomiting.  Pt had started Cymbalta 2 days prior to ER visit (per neuro).  EDP thought pt's sxs were medication related and told her to stop the Cymbalta.  Due to her dehydration, she was also told to stop Spironolactone.  Pt reports vomiting and diarrhea have stopped the she continues to have 'waves of nausea'.  Pt also reports that she is having severe swelling of legs bilaterally- worsening as the day goes on.  No known sick contacts recently.  Pt's BP is elevated today.  Denies CP, SOB, HAs.   Review of Systems For ROS see HPI     Objective:   Physical Exam  Constitutional: She is oriented to person, place, and time. She appears well-developed and well-nourished. No distress.  HENT:  Head: Normocephalic and atraumatic.  Eyes: Conjunctivae and EOM are normal. Pupils are equal, round, and reactive to light.  Neck: Normal range of motion. Neck supple. No thyromegaly present.  Cardiovascular: Normal rate, regular rhythm, normal heart sounds and intact distal pulses.   No murmur heard. Pulmonary/Chest: Effort normal and breath sounds normal. No respiratory distress.  Abdominal: Soft. She exhibits no distension. There is no tenderness.  Musculoskeletal: She exhibits edema (bilateral LE edema to mid shin).  Lymphadenopathy:    She has no cervical adenopathy.  Neurological: She is alert and oriented to person, place, and time.  Skin: Skin is warm and dry.  Psychiatric: She has a normal mood and affect. Her behavior is normal.  Vitals reviewed.         Assessment & Plan:

## 2014-08-02 ENCOUNTER — Other Ambulatory Visit (HOSPITAL_BASED_OUTPATIENT_CLINIC_OR_DEPARTMENT_OTHER): Payer: Medicare PPO

## 2014-08-02 ENCOUNTER — Ambulatory Visit (HOSPITAL_COMMUNITY)
Admission: RE | Admit: 2014-08-02 | Discharge: 2014-08-02 | Disposition: A | Discharge status: Home / Self Care | Payer: Medicare PPO | Source: Ambulatory Visit | Attending: Internal Medicine | Admitting: Internal Medicine

## 2014-08-02 ENCOUNTER — Encounter (HOSPITAL_COMMUNITY): Payer: Self-pay

## 2014-08-02 DIAGNOSIS — C3411 Malignant neoplasm of upper lobe, right bronchus or lung: Secondary | ICD-10-CM | POA: Insufficient documentation

## 2014-08-02 DIAGNOSIS — R918 Other nonspecific abnormal finding of lung field: Secondary | ICD-10-CM | POA: Diagnosis not present

## 2014-08-02 DIAGNOSIS — M5136 Other intervertebral disc degeneration, lumbar region: Secondary | ICD-10-CM | POA: Diagnosis not present

## 2014-08-02 LAB — CBC WITH DIFFERENTIAL/PLATELET
BASO%: 0.5 % (ref 0.0–2.0)
Basophils Absolute: 0 10*3/uL (ref 0.0–0.1)
EOS%: 0.7 % (ref 0.0–7.0)
Eosinophils Absolute: 0 10*3/uL (ref 0.0–0.5)
HCT: 33.4 % — ABNORMAL LOW (ref 34.8–46.6)
HGB: 11.1 g/dL — ABNORMAL LOW (ref 11.6–15.9)
LYMPH%: 23.4 % (ref 14.0–49.7)
MCH: 30.3 pg (ref 25.1–34.0)
MCHC: 33.2 g/dL (ref 31.5–36.0)
MCV: 91.3 fL (ref 79.5–101.0)
MONO#: 0.4 10*3/uL (ref 0.1–0.9)
MONO%: 9.8 % (ref 0.0–14.0)
NEUT#: 2.7 10*3/uL (ref 1.5–6.5)
NEUT%: 65.6 % (ref 38.4–76.8)
PLATELETS: 159 10*3/uL (ref 145–400)
RBC: 3.66 10*6/uL — ABNORMAL LOW (ref 3.70–5.45)
RDW: 13.9 % (ref 11.2–14.5)
WBC: 4.2 10*3/uL (ref 3.9–10.3)
lymph#: 1 10*3/uL (ref 0.9–3.3)

## 2014-08-02 LAB — COMPREHENSIVE METABOLIC PANEL (CC13)
ALK PHOS: 58 U/L (ref 40–150)
ALT: 16 U/L (ref 0–55)
AST: 28 U/L (ref 5–34)
Albumin: 3.9 g/dL (ref 3.5–5.0)
Anion Gap: 8 mEq/L (ref 3–11)
BILIRUBIN TOTAL: 0.38 mg/dL (ref 0.20–1.20)
BUN: 17.3 mg/dL (ref 7.0–26.0)
CO2: 26 mEq/L (ref 22–29)
Calcium: 9.1 mg/dL (ref 8.4–10.4)
Chloride: 101 mEq/L (ref 98–109)
Creatinine: 1 mg/dL (ref 0.6–1.1)
EGFR: 54 mL/min/{1.73_m2} — AB (ref >90)
Glucose: 98 mg/dl (ref 70–140)
Potassium: 4.5 mEq/L (ref 3.5–5.1)
SODIUM: 135 meq/L — AB (ref 136–145)
TOTAL PROTEIN: 6.9 g/dL (ref 6.4–8.3)

## 2014-08-02 MED ORDER — IOHEXOL 300 MG/ML  SOLN
100.0000 mL | Freq: Once | INTRAMUSCULAR | Status: AC | PRN
  Administered 2014-08-02: 80 mL via INTRAVENOUS

## 2014-08-03 ENCOUNTER — Encounter: Payer: Self-pay | Admitting: Family Medicine

## 2014-08-09 ENCOUNTER — Ambulatory Visit (HOSPITAL_BASED_OUTPATIENT_CLINIC_OR_DEPARTMENT_OTHER): Payer: Medicare PPO | Admitting: Internal Medicine

## 2014-08-09 ENCOUNTER — Telehealth: Payer: Self-pay | Admitting: Internal Medicine

## 2014-08-09 ENCOUNTER — Encounter: Payer: Self-pay | Admitting: *Deleted

## 2014-08-09 ENCOUNTER — Ambulatory Visit (INDEPENDENT_AMBULATORY_CARE_PROVIDER_SITE_OTHER): Payer: Medicare PPO | Admitting: Family Medicine

## 2014-08-09 ENCOUNTER — Encounter: Payer: Self-pay | Admitting: Family Medicine

## 2014-08-09 ENCOUNTER — Ambulatory Visit: Payer: Medicare PPO | Admitting: Family Medicine

## 2014-08-09 ENCOUNTER — Encounter: Payer: Self-pay | Admitting: Internal Medicine

## 2014-08-09 VITALS — BP 138/55 | HR 72 | Temp 98.2°F | Resp 18 | Ht 64.0 in | Wt 105.3 lb

## 2014-08-09 VITALS — BP 140/78 | HR 60 | Temp 97.9°F | Resp 16 | Wt 106.4 lb

## 2014-08-09 DIAGNOSIS — Z85118 Personal history of other malignant neoplasm of bronchus and lung: Secondary | ICD-10-CM | POA: Diagnosis not present

## 2014-08-09 DIAGNOSIS — R030 Elevated blood-pressure reading, without diagnosis of hypertension: Secondary | ICD-10-CM

## 2014-08-09 DIAGNOSIS — C3411 Malignant neoplasm of upper lobe, right bronchus or lung: Secondary | ICD-10-CM

## 2014-08-09 DIAGNOSIS — IMO0001 Reserved for inherently not codable concepts without codable children: Secondary | ICD-10-CM

## 2014-08-09 NOTE — Telephone Encounter (Signed)
Gave and printed appts ched and avs for pt for DEC  °

## 2014-08-09 NOTE — Progress Notes (Signed)
[place] [phone]   [phone]  OFFICE PROGRESS NOTE  [name], MD [place] [address]  DIAGNOSIS: Stage IIA (T2b, N0, M0) non-small cell lung cancer, adenocarcinoma with positive EGFR mutation in exon 21 ([id number]) presented with right upper lobe lung mass diagnosed in March of 2015.  PRIOR THERAPY: Status post right upper lobectomy with wedge resection of the right middle lobe under the care of Dr. Servando Snare on 06/07/2013  CURRENT THERAPY: Observation.  INTERVAL HISTORY: [name] 81 y.o. female returns to the clinic today for followup visit. The patient is feeling fine today with no specific complaints. She denied having any significant weakness or fatigue. She has no nausea or vomiting. She has no fever or chills. She denied having any significant chest pain, shortness of breath, cough or hemoptysis. She underwent right total hip replacement in March 2016. She had repeat CT scan of the chest performed recently and she is here for evaluation and discussion of her scan results.   MEDICAL HISTORY: Past Medical History  Diagnosis Date  . Iron deficiency anemia   . Sjogren's syndrome   . Abnormal ECG 11/2008    left bbb  . Raynaud's phenomenon 1965  . Arthritis   . Colon polyps   . Constipation     takes Colace daily  . GERD (gastroesophageal reflux disease)     takes Omeprazole daily  . Dry eyes     eye drops used   . Rheumatoid arthritis   . Back pain     scoliosis  . Osteoporosis   . Sciatica   . Cancer 2015    lung  . Lupus     takes Plaquenil daily  . PONV (postoperative nausea and vomiting)     likes phenergan    ALLERGIES:  is allergic to prochlorperazine edisylate and aspirin.  MEDICATIONS:  Current Outpatient Prescriptions  Medication Sig Dispense Refill  . alum & mag hydroxide-simeth (MAALOX/MYLANTA) 200-200-20 MG/5ML suspension Take 30 mLs by mouth every 4 (four) hours  as needed for indigestion. 355 mL 0  . Biotin 1000 MCG tablet Take 1,000 mcg by mouth daily.     . Calcium Carbonate-Vitamin D (CALCIUM 500 + D) 500-125 MG-UNIT TABS Take 1 tablet by mouth daily.     Marland Kitchen docusate sodium (STOOL SOFTENER) 100 MG capsule Take 100 mg by mouth 2 (two) times daily.      Marland Kitchen ESTRACE VAGINAL 0.1 MG/GM vaginal cream USE AS DIRECTED 42.5 g 1  . gabapentin (NEURONTIN) 600 MG tablet Take 600 mg by mouth 3 (three) times daily.    . Glucosamine-Chondroit-Vit C-Mn (GLUCOSAMINE CHONDR 1500 COMPLX PO) Take 1 capsule by mouth daily.     . meloxicam (MOBIC) 15 MG tablet Take 1 tablet (15 mg total) by mouth daily. 30 tablet 6  . Multiple Vitamin (MULTIVITAMIN) tablet Take 1 tablet by mouth daily.      Marland Kitchen omeprazole (PRILOSEC) 20 MG capsule TAKE 1 CAPSULE (20 MG TOTAL) BY MOUTH 2 (TWO) TIMES DAILY BEFORE A MEAL. 60 capsule 6  . ondansetron (ZOFRAN) 8 MG tablet Take 1 tablet (8 mg total) by mouth every 8 (eight) hours as needed for nausea or vomiting. 10 tablet 0  . spironolactone (ALDACTONE) 25 MG tablet Take 25 mg by mouth daily.  2   No current facility-administered medications for this visit.    SURGICAL HISTORY:  Past Surgical History  Procedure Laterality Date  .  Appendectomy    . Tonsillectomy    . Total abdominal hysterectomy  1980's   . Thoracic sympathetectomy  1965  . Cataract extraction Bilateral   . Colonoscopy    . Cardiovascular stress test      12/2008 mild fixed basal to mid septal perfusion defect felt likely due to artifact from LBBB, no ischemia, EF 58%  . Video bronchoscopy with endobronchial navigation N/A 05/04/2013    Procedure: VIDEO BRONCHOSCOPY WITH ENDOBRONCHIAL NAVIGATION;  Surgeon: [name], MD;  Location: [facility];  Service: Thoracic;  Laterality: N/A;  . Video bronchoscopy N/A 06/07/2013    Procedure: VIDEO BRONCHOSCOPY;  Surgeon: [name], MD;  Location[facility];  Service: Thoracic;  Laterality: N/A;  . Video assisted thoracoscopy  (vats)/wedge resection Right 06/07/2013    Procedure: VIDEO ASSISTED THORACOSCOPY (VATS)/right upper lobectomy, On Q;  Surgeon: [name], MD;  Location: [facility];  Service: Thoracic;  Laterality: Right;  . Thoracotomy  06/07/2013    Procedure: MINI/LIMITED THORACOTOMY; right middle lobe wedge resection;  Surgeon: [name], MD;  Location: [facility] OR;  Service: Thoracic;;  . Lymph node dissection Right 06/07/2013    Procedure: LYMPH NODE DISSECTION;  Surgeon: [name], MD;  Location: [facility];  Service: Thoracic;  Laterality: Right;  . Total hip arthroplasty Right 04/28/2014    Procedure: RIGHT TOTAL HIP ARTHROPLASTY ANTERIOR APPROACH;  Surgeon: [name], MD;  Location: [facility];  Service: Orthopedics;  Laterality: Right;    REVIEW OF SYSTEMS:  Constitutional: negative Eyes: negative Ears, nose, mouth, throat, and face: negative Respiratory: positive for pleurisy/chest pain Cardiovascular: negative Gastrointestinal: negative Genitourinary:negative Integument/breast: negative Hematologic/lymphatic: negative Musculoskeletal:negative Neurological: negative Behavioral/Psych: negative Endocrine: negative Allergic/Immunologic: negative   PHYSICAL EXAMINATION: General appearance: alert, cooperative and no distress Head: Normocephalic, without obvious abnormality, atraumatic Neck: no adenopathy, no JVD, supple, symmetrical, trachea midline and thyroid not enlarged, symmetric, no tenderness/mass/nodules Lymph nodes: Cervical, supraclavicular, and axillary nodes normal. Resp: clear to auscultation bilaterally Back: symmetric, no curvature. ROM normal. No CVA tenderness. Cardio: regular rate and rhythm, S1, S2 normal, no murmur, click, rub or gallop GI: soft, non-tender; bowel sounds normal; no masses,  no organomegaly Extremities: extremities normal, atraumatic, no cyanosis or edema Neurologic: Alert and oriented X 3, normal strength and tone. Normal symmetric reflexes.  Normal coordination and gait  ECOG PERFORMANCE STATUS: 1 - Symptomatic but completely ambulatory  Blood pressure 138/55, pulse 72, temperature 98.2 F (36.8 C), temperature source Oral, resp. rate 18, height $RemoveBe'5\' 4"'gjpSTWsks$  (1.626 m), weight 105 lb 4.8 oz (47.764 kg), SpO2 97 %.  LABORATORY DATA: Lab Results  Component Value Date   WBC 4.2 08/02/2014   HGB 11.1* 08/02/2014   HCT 33.4* 08/02/2014   MCV 91.3 08/02/2014   PLT 159 08/02/2014      Chemistry      Component Value Date/Time   NA 135* 08/02/2014 1103   NA 132* 08/01/2014 0923   K 4.5 08/02/2014 1103   K 4.2 08/01/2014 0923   CL 98 08/01/2014 0923   CO2 26 08/02/2014 1103   CO2 29 08/01/2014 0923   BUN 17.3 08/02/2014 1103   BUN 18 08/01/2014 0923   CREATININE 1.0 08/02/2014 1103   CREATININE 0.92 08/01/2014 0923   CREATININE 1.15* 11/11/2013 1557      Component Value Date/Time   CALCIUM 9.1 08/02/2014 1103   CALCIUM 9.3 08/01/2014 0923   ALKPHOS 58 08/02/2014 1103   ALKPHOS 51 07/29/2014 0920   AST 28  08/02/2014 1103   AST 31 07/29/2014 0920   ALT 16 08/02/2014 1103   ALT 15 07/29/2014 0920   BILITOT 0.38 08/02/2014 1103   BILITOT 0.3 07/29/2014 0920       RADIOGRAPHIC STUDIES: Ct Chest W Contrast  08/02/2014   CLINICAL DATA:  Restaging right lung cancer. Original diagnosis April 2015. Right upper lobectomy.  EXAM: CT CHEST WITH CONTRAST  TECHNIQUE: Multidetector CT imaging of the chest was performed during intravenous contrast administration.  CONTRAST:  62mL OMNIPAQUE IOHEXOL 300 MG/ML  SOLN  COMPARISON:  Multiple exams, including 01/23/2014  FINDINGS: Mediastinum/Nodes: No adenopathy. Mild distortion due to prior right-sided surgery.  Lungs/Pleura: Wedge resection clips on the right. Scarring and interstitial accentuation at the right lung apex.  4 mm nodule left upper lobe, image 14 series 6, no change from 12/12/2008  Upper abdomen: 0.6 by 0.4 cm small enhancing nodule in segment 7, image 50 series 2 is stable.   Musculoskeletal: Degenerative disc disease at L2-3.  IMPRESSION: 1. No recurrence identified. 2. Benign 4 mm left upper lobe nodule, no change from 2010. 3. Average size 5 mm small enhancing nodule in segment 7 of the liver, no change from prior, probably incidental/benign, merits observation.   Electronically Signed   By: Van Clines M.D.   On: 08/02/2014 14:02   Dg Abd Acute W/chest  07/29/2014   CLINICAL DATA:  Nausea and vomiting for 1 day, recent new medication  EXAM: DG ABDOMEN ACUTE W/ 1V CHEST  COMPARISON:  09/08/2013  FINDINGS: Cardiac shadow is stable. The lungs are well aerated bilaterally. No focal infiltrate or sizable effusion is seen. Postsurgical changes are noted in the right hilum.  The abdomen shows a nonobstructive bowel gas pattern. No abnormal mass or abnormal calcifications are seen. Chronic scoliosis of the lumbar spine is seen. Postsurgical change in the right hip is noted.  IMPRESSION: Chronic changes without acute abnormality.   Electronically Signed   By: Inez Catalina M.D.   On: 07/29/2014 10:26   ASSESSMENT AND PLAN: This is a very pleasant 81 years old white female recently diagnosed with a stage IIA non-small cell lung cancer, adenocarcinoma with positive EGFR mutation in exon 21.  The patient declined the involvement in the clinical trial and she also declined adjuvant chemotherapy. Her recent CT scan of the chest showed no evidence for disease recurrence. I discussed the scan results with the patient today. I recommended for her to continue on observation with repeat CT scan of the chest in 6 months. She was advised to call immediately if she has any concerning symptoms in the interval. The patient voices understanding of current disease status and treatment options and is in agreement with the current care plan.  All questions were answered. The patient knows to call the clinic with any problems, questions or concerns. We can certainly see the patient much sooner  if necessary.  Disclaimer: This note was dictated with voice recognition software. Similar sounding words can inadvertently be transcribed and may not be corrected upon review.

## 2014-08-09 NOTE — Assessment & Plan Note (Signed)
BP has returned to normal for pt.  Asymptomatic w/ exception of LE edema (pt has appt w/ Rheum tomorrow).  Repeat BMP as pt restarted spironolactone.  No anticipated med changes at this time.

## 2014-08-09 NOTE — Patient Instructions (Signed)
Schedule your complete physical in 3-4 months We'll notify you of your lab results and fax them to Dr Earnest Conroy Continue the Spironolactone daily Call with any questions or concerns Have a wonderful summer!!!

## 2014-08-09 NOTE — Progress Notes (Signed)
   Subjective:    Patient ID: [name], female    DOB: [birth year], 81 y.o.   MRN: [id number]  HPI Elevated BP- noted at last visit.  Restarted Spironolactone last week.  Now checking home BPs daily.  Denies CP, SOB, HAs, visual changes.  Continues to have lower extremity edema, L>R but this is better than previously.  Fax results to Dr Gerilyn Nestle [phone]   Review of Systems For ROS see HPI     Objective:   Physical Exam  Constitutional: She is oriented to person, place, and time. She appears well-developed and well-nourished. No distress.  HENT:  Head: Normocephalic and atraumatic.  Eyes: Conjunctivae and EOM are normal. Pupils are equal, round, and reactive to light.  Neck: Normal range of motion. Neck supple. No thyromegaly present.  Cardiovascular: Normal rate, regular rhythm, normal heart sounds and intact distal pulses.   Pulmonary/Chest: Effort normal and breath sounds normal. No respiratory distress.  Abdominal: Soft. She exhibits no distension. There is no tenderness.  Musculoskeletal: She exhibits edema (1+ L LE, trace on R LE).  Lymphadenopathy:    She has no cervical adenopathy.  Neurological: She is alert and oriented to person, place, and time.  Skin: Skin is warm and dry.  Psychiatric: She has a normal mood and affect. Her behavior is normal.  Vitals reviewed.         Assessment & Plan:

## 2014-08-09 NOTE — Progress Notes (Signed)
Pre visit review using our clinic review tool, if applicable. No additional management support is needed unless otherwise documented below in the visit note. 

## 2014-08-10 LAB — BASIC METABOLIC PANEL
BUN: 27 mg/dL — ABNORMAL HIGH (ref 6–23)
CALCIUM: 9.4 mg/dL (ref 8.4–10.5)
CO2: 28 mEq/L (ref 19–32)
CREATININE: 1.13 mg/dL (ref 0.40–1.20)
Chloride: 98 mEq/L (ref 96–112)
GFR: 49.03 mL/min — AB (ref >60.00)
Glucose, Bld: 98 mg/dL (ref 70–99)
Potassium: 4.7 mEq/L (ref 3.5–5.1)
Sodium: 131 mEq/L — ABNORMAL LOW (ref 135–145)

## 2014-09-01 ENCOUNTER — Other Ambulatory Visit: Payer: Self-pay | Admitting: *Deleted

## 2014-09-01 DIAGNOSIS — C3411 Malignant neoplasm of upper lobe, right bronchus or lung: Secondary | ICD-10-CM

## 2014-09-07 ENCOUNTER — Other Ambulatory Visit: Payer: Medicare PPO

## 2014-09-07 ENCOUNTER — Encounter: Payer: Medicare PPO | Admitting: *Deleted

## 2014-09-07 ENCOUNTER — Ambulatory Visit: Payer: Medicare PPO | Admitting: Cardiothoracic Surgery

## 2014-09-07 DIAGNOSIS — C3411 Malignant neoplasm of upper lobe, right bronchus or lung: Secondary | ICD-10-CM

## 2014-09-07 LAB — RESEARCH LABS

## 2014-09-07 NOTE — Progress Notes (Signed)
09/07/2014 See Consent Form encounter note for details related to today's research visit. Cindy S. Brigitte Pulse BSN, RN, [name] 09/07/2014 4:17 PM

## 2014-09-13 ENCOUNTER — Encounter: Payer: Self-pay | Admitting: Cardiology

## 2014-09-13 ENCOUNTER — Other Ambulatory Visit: Payer: Self-pay

## 2014-10-03 ENCOUNTER — Telehealth: Payer: Self-pay | Admitting: Family Medicine

## 2014-10-03 ENCOUNTER — Ambulatory Visit: Payer: Medicare PPO | Admitting: Cardiothoracic Surgery

## 2014-10-03 DIAGNOSIS — M5416 Radiculopathy, lumbar region: Secondary | ICD-10-CM | POA: Diagnosis not present

## 2014-10-03 DIAGNOSIS — M419 Scoliosis, unspecified: Secondary | ICD-10-CM | POA: Diagnosis not present

## 2014-10-03 DIAGNOSIS — G253 Myoclonus: Secondary | ICD-10-CM | POA: Diagnosis not present

## 2014-10-03 NOTE — Telephone Encounter (Signed)
Caller name: Jenny Reichmann  Relation to pt: [facility] Dr. Mosetta Anis  Call back number: [phone]   Reason for call:  As per Dr.Kirby patient was seen and based on patient CMP and sodium level was low and advised patient to follow up with PCP 10/04/14.  (labs faxed over to [phone] for PCP review)

## 2014-10-04 ENCOUNTER — Encounter: Payer: Self-pay | Admitting: Family Medicine

## 2014-10-04 ENCOUNTER — Ambulatory Visit (INDEPENDENT_AMBULATORY_CARE_PROVIDER_SITE_OTHER): Payer: Medicare PPO | Admitting: Family Medicine

## 2014-10-04 VITALS — BP 136/86 | HR 90 | Temp 98.2°F | Resp 16 | Wt 102.2 lb

## 2014-10-04 DIAGNOSIS — M25551 Pain in right hip: Secondary | ICD-10-CM | POA: Diagnosis not present

## 2014-10-04 DIAGNOSIS — M1611 Unilateral primary osteoarthritis, right hip: Secondary | ICD-10-CM | POA: Diagnosis not present

## 2014-10-04 DIAGNOSIS — R030 Elevated blood-pressure reading, without diagnosis of hypertension: Secondary | ICD-10-CM

## 2014-10-04 DIAGNOSIS — E270 Other adrenocortical overactivity: Secondary | ICD-10-CM | POA: Insufficient documentation

## 2014-10-04 DIAGNOSIS — Z96641 Presence of right artificial hip joint: Secondary | ICD-10-CM | POA: Diagnosis not present

## 2014-10-04 DIAGNOSIS — IMO0001 Reserved for inherently not codable concepts without codable children: Secondary | ICD-10-CM

## 2014-10-04 NOTE — Progress Notes (Signed)
Pre visit review using our clinic review tool, if applicable. No additional management support is needed unless otherwise documented below in the visit note. 

## 2014-10-04 NOTE — Telephone Encounter (Signed)
Noted  

## 2014-10-04 NOTE — Patient Instructions (Signed)
STOP the Spironolactone We'll notify you of your Endocrinology appt Continue to drink plenty of fluids Call with any questions or concerns Hang in there!!

## 2014-10-04 NOTE — Progress Notes (Signed)
   Subjective:    Patient ID: [name], female    DOB: 1930/07/11, 81 y.o.   MRN: [id number]  HPI F/U- pt was seen at Neuro yesterday (Dr Baltazar Najjar).  Pt is receiving spinal epidurals as needed.  Ortho recommended PT and pain meds as pt is not a surgical candidate.  Pt reports Neuro was concerned b/c she continues to have BP spikes first thing in the AM that cause sweaty palms, diarrhea, and some shaking.  Pt denies anxiety.  Pt also reports episodes of involuntary hand movements, L>R.  Pt is dropping more things- neuro labeled this Myoclonus.     Review of Systems For ROS see HPI     Objective:   Physical Exam  Constitutional: She is oriented to person, place, and time. She appears well-developed and well-nourished. No distress.  HENT:  Head: Normocephalic and atraumatic.  Eyes: Conjunctivae and EOM are normal. Pupils are equal, round, and reactive to light.  Neck: Normal range of motion. Neck supple. No thyromegaly present.  Cardiovascular: Normal rate, regular rhythm, normal heart sounds and intact distal pulses.   No murmur heard. Pulmonary/Chest: Effort normal and breath sounds normal. No respiratory distress.  Abdominal: Soft. She exhibits no distension. There is no tenderness.  Musculoskeletal: She exhibits no edema.  Lymphadenopathy:    She has no cervical adenopathy.  Neurological: She is alert and oriented to person, place, and time.  Skin: Skin is warm and dry.  Psychiatric: She has a normal mood and affect. Her behavior is normal.  Vitals reviewed.         Assessment & Plan:

## 2014-10-05 NOTE — Assessment & Plan Note (Signed)
Pt continues to have pre-dawn BP spikes.  At this time, this is concerning for adrenal surge.  Will not start medication but will hold Spironolactone and monitor for improvement.  Reviewed supportive care and red flags that should prompt return.  Pt expressed understanding and is in agreement w/ plan.

## 2014-10-05 NOTE — Assessment & Plan Note (Addendum)
New.  Pt is having early morning/pre dawn  BP spikes, flushing, nausea, diarrhea consistent w/ adrenal surge.  Also having mild hyponatremia and low chloride.  These sxs correlated w/ starting Spironolactone for swelling.  Since Spironolactone is a mineralocorticoid, will have her hold this medication and will refer to Endo for complete evaluation.  Pt expressed understanding and is in agreement w/ plan.

## 2014-10-09 ENCOUNTER — Encounter: Payer: Self-pay | Admitting: Cardiothoracic Surgery

## 2014-10-09 ENCOUNTER — Ambulatory Visit (INDEPENDENT_AMBULATORY_CARE_PROVIDER_SITE_OTHER): Payer: Medicare PPO | Admitting: Cardiothoracic Surgery

## 2014-10-09 VITALS — BP 136/74 | HR 70 | Resp 20 | Ht 64.0 in | Wt 102.0 lb

## 2014-10-09 DIAGNOSIS — Z9889 Other specified postprocedural states: Secondary | ICD-10-CM | POA: Diagnosis not present

## 2014-10-09 DIAGNOSIS — Z902 Acquired absence of lung [part of]: Secondary | ICD-10-CM

## 2014-10-09 DIAGNOSIS — C3411 Malignant neoplasm of upper lobe, right bronchus or lung: Secondary | ICD-10-CM | POA: Diagnosis not present

## 2014-10-09 NOTE — Progress Notes (Signed)
[facility] Record #[id number] Date of Birth: [birth year]  Referring CV:[id number], Gunnar Fusi, MD Primary Cardiology: Primary Care:Katherine Birdie Riddle, MD  Chief Complaint:  Follow Up Visit  Surgery: 06/07/2013   OPERATIVE REPORT  PREOPERATIVE DIAGNOSIS: Right upper lobe adenocarcinoma of the lung.  POSTOPERATIVE DIAGNOSIS: Right upper lobe adenocarcinoma of the lung.  SURGICAL PROCEDURES: Video bronchoscopy, right video-assisted  thoracoscopy, mini-thoracotomy, right upper lobectomy with lymph node  dissection, wedge resection of right middle lobe, placement of On-Q.  SURGEON: Lanelle Bal, MD.  Lung cancer, Right upper lobe   Primary site: Lung (Right)   Staging method: AJCC 7th Edition   Clinical: Stage IIA (T2b, N0, M0) signed by [name], MD on 05/08/2013 10:00 PM   Pathologic: Stage IIA (T2b, N0, cM0) signed by [name], MD on 06/13/2013 10:49 AM   Summary: Stage IIA (T2b, N0, cM0)  History of Present Illness:     The patient is an 81 year old female who  presents for routine follow up s/p right mini thoracotomy and right upper lobectomy and wedge resection of the right middle lobe on 06/07/2013.Marland Kitchen Her rheumatoid arthritis is limiting her overall activity. Since last seen she has had a right hip replacement.  Zubrod Score: At the time of surgery this patient's most appropriate activity status/level should be described as: '[]'$     0    Normal activity, no symptoms '[x]'$     1    Restricted in physical strenuous activity but ambulatory, able to do out light work '[]'$     2    Ambulatory and capable of self care, unable to do work activities, up and about                 >50 % of waking hours                                                                                   '[]'$     3    Only limited self care, in bed greater than 50% of waking hours '[]'$     4    Completely disabled, no self care, confined to bed or chair '[]'$     5     Moribund  History  Smoking status  . Never Smoker   Smokeless tobacco  . Never Used       Allergies  Allergen Reactions  . Prochlorperazine Edisylate Anaphylaxis    Compazine  . Aspirin     REACTION: nose bleeds    Current Outpatient Prescriptions  Medication Sig Dispense Refill  . Biotin 1000 MCG tablet Take 1,000 mcg by mouth daily.     . Calcium Carbonate-Vitamin D (CALCIUM 500 + D) 500-125 MG-UNIT TABS Take 1 tablet by mouth daily.     Marland Kitchen docusate sodium (STOOL SOFTENER) 100 MG capsule Take 100 mg by mouth 2 (two) times daily.      Marland Kitchen ESTRACE VAGINAL 0.1 MG/GM vaginal cream USE AS DIRECTED 42.5 g 1  . gabapentin (NEURONTIN) 300 MG capsule Take 300 mg by mouth 2 (two) times daily.    Marland Kitchen  Glucosamine-Chondroit-Vit C-Mn (GLUCOSAMINE CHONDR 1500 COMPLX PO) Take 1 capsule by mouth daily.     Marland Kitchen levETIRAcetam (KEPPRA) 250 MG tablet     . meloxicam (MOBIC) 7.5 MG tablet Take 7.5 mg by mouth 2 (two) times daily as needed for pain.    . Multiple Vitamin (MULTIVITAMIN) tablet Take 1 tablet by mouth daily.      Marland Kitchen omeprazole (PRILOSEC) 20 MG capsule TAKE 1 CAPSULE (20 MG TOTAL) BY MOUTH 2 (TWO) TIMES DAILY BEFORE A MEAL. 60 capsule 6  . spironolactone (ALDACTONE) 25 MG tablet Take 25 mg by mouth daily.  2   No current facility-administered medications for this visit.     Physical Exam: BP 136/74 mmHg  Pulse 70  Resp 20  Ht '5\' 4"'$  (1.626 m)  Wt 102 lb (46.267 kg)  BMI 17.50 kg/m2  SpO2 96%  General appearance: alert, cooperative and no distress Heart: regular rate and rhythm Lungs: left lung is clear; diminished at right apex but right base is clear Abdomen: Soft, minimal tenderness with palpation to right upper quadrant, minor distention, bowel sounds present Extremities: no edema Wound: Completely healed Some very mild edema laft foot more then right  Diagnostic Studies & Laboratory data:         Recent Radiology Findings: CLINICAL DATA: Restaging right lung cancer.  Original diagnosis April 2015. Right upper lobectomy.  EXAM: CT CHEST WITH CONTRAST  TECHNIQUE: Multidetector CT imaging of the chest was performed during intravenous contrast administration.  CONTRAST: 15m OMNIPAQUE IOHEXOL 300 MG/ML SOLN  COMPARISON: Multiple exams, including 01/23/2014  FINDINGS: Mediastinum/Nodes: No adenopathy. Mild distortion due to prior right-sided surgery.  Lungs/Pleura: Wedge resection clips on the right. Scarring and interstitial accentuation at the right lung apex.  4 mm nodule left upper lobe, image 14 series 6, no change from 12/12/2008  Upper abdomen: 0.6 by 0.4 cm small enhancing nodule in segment 7, image 50 series 2 is stable.  Musculoskeletal: Degenerative disc disease at L2-3.  IMPRESSION: 1. No recurrence identified. 2. Benign 4 mm left upper lobe nodule, no change from 2010. 3. Average size 5 mm small enhancing nodule in segment 7 of the liver, no change from prior, probably incidental/benign, merits observation.   Electronically Signed  By: WVan ClinesM.D.  On: 08/02/2014 14:02    CLINICAL DATA: Lung cancer. Right upper lobectomy. Weight loss. Lupus. Shortness of breath on exertion.  EXAM: CT CHEST WITH CONTRAST  TECHNIQUE: Multidetector CT imaging of the chest was performed during intravenous contrast administration.  CONTRAST: 633mOMNIPAQUE IOHEXOL 300 MG/ML SOLN  COMPARISON: 04/22/2013 ; 04/27/2013  FINDINGS: Right upper lobectomy with a small amount of fluid in the pleural space at the right lung apex, and volume loss in the right hemithorax.  No pathologic thoracic adenopathy. Flat web like filling defect along the upper margin the right pulmonary artery, image 26 series 2, possibly due to an infolding of the vessel wall related to rotation/traction, less likely to be chronic pulmonary embolus.  7 by 4 mm focus of arterial phase enhancement posteriorly in segment 7  of the liver, image 47 series 2, stable from 03/25/2013.  Scarring at the apex of the right lung with adjacent resection clips. Reticulonodular interstitial opacity posteriorly in the right lung. Mild scarring in the left lower lobe posteriorly.  IMPRESSION: 1. No findings of recurrent malignancy at this time. Postoperative findings in the right chest. 2. Faint inflammatory reticulonodular interstitial accentuation in the right lung, potentially from atypical infectious bronchiolitis. 3. Flat web  like filling defect along the upper margin of the right pulmonary artery, probably from infolding of the vessel related to rotation or traction. 4. Small amount of pleural fluid at the right lung apex. 5. 7 by 4 mm focus of arterial phase enhancement in segment 7 of the liver, stable from 03/25/2013, probably benign lesion such as hemangioma flash filling, but warranting observation on any subsequent imaging studies.   Electronically Signed  By: Sherryl Barters M.D.  On: 01/23/2014 17:55  I have independently reviewed the above radiology studies  and reviewed the findings with the patient.   Recent Labs: Lab Results  Component Value Date   WBC 4.2 08/02/2014   HGB 11.1* 08/02/2014   HCT 33.4* 08/02/2014   PLT 159 08/02/2014   GLUCOSE 98 08/09/2014   CHOL 204* 11/11/2013   TRIG 72 11/11/2013   HDL 86 11/11/2013   LDLDIRECT 100.9 08/13/2011   LDLCALC 104* 11/11/2013   ALT 16 08/02/2014   AST 28 08/02/2014   NA 131* 08/09/2014   K 4.7 08/09/2014   CL 98 08/09/2014   CREATININE 1.13 08/09/2014   BUN 27* 08/09/2014   CO2 28 08/09/2014   TSH 2.859 11/11/2013   INR 0.97 04/21/2014      Assessment / Plan:   Stable following right lung resection without evidence of recurrence by CT under observational treatment currently. Plan see the patient back in December 2016 after her followup CT scan part he ordered by oncology.   [name] MD      [address] [place] [address] Office [phone]   Beeper [phone]

## 2014-11-16 ENCOUNTER — Encounter: Payer: Self-pay | Admitting: Obstetrics & Gynecology

## 2014-11-16 ENCOUNTER — Ambulatory Visit (INDEPENDENT_AMBULATORY_CARE_PROVIDER_SITE_OTHER): Payer: Medicare PPO | Admitting: Obstetrics & Gynecology

## 2014-11-16 VITALS — BP 139/65 | HR 75 | Ht 62.75 in | Wt 104.0 lb

## 2014-11-16 DIAGNOSIS — Z01419 Encounter for gynecological examination (general) (routine) without abnormal findings: Secondary | ICD-10-CM | POA: Diagnosis not present

## 2014-11-16 DIAGNOSIS — Z90710 Acquired absence of both cervix and uterus: Secondary | ICD-10-CM | POA: Diagnosis not present

## 2014-11-16 DIAGNOSIS — N9489 Other specified conditions associated with female genital organs and menstrual cycle: Secondary | ICD-10-CM

## 2014-11-16 DIAGNOSIS — Z1239 Encounter for other screening for malignant neoplasm of breast: Secondary | ICD-10-CM | POA: Diagnosis not present

## 2014-11-16 DIAGNOSIS — M8580 Other specified disorders of bone density and structure, unspecified site: Secondary | ICD-10-CM

## 2014-11-16 NOTE — Progress Notes (Signed)
Patient ID: ASIANNA BRUNDAGE, female   DOB: 11-04-1930, 81 y.o.   MRN: [id number] Subjective:     JAMEKIA GANNETT is a 81 y.o. female here for a routine exam.  Last pelvic 11 years prev. Diagnosised with Lung CA 2 years prev. Current complaints: pt noted a tiny spec that feels larger over the past 6 months that is getting a bit larger..  Personal health questionnaire reviewed: yes.   Gynecologic History No LMP recorded. Patient has had a hysterectomy. Contraception: post menopausal status Last Pap: unknown. Results were: normal Last mammogram: 2-3 years prev.  Had PET scan <2 years-no lesions on breast.  Reports h/o dense breasts. . Results were: normal  Obstetric History OB History  Gravida Para Term Preterm AB SAB TAB Ectopic Multiple Living  '4 2   2 2    2    '$ # Outcome Date GA Lbr Len/2nd Weight Sex Delivery Anes PTL Lv  4 SAB           3 SAB           2 Para           1 Para                The following portions of the patient's history were reviewed and updated as appropriate: allergies, current medications, past family history, past medical history, past social history, past surgical history and problem list.  Review of Systems Pertinent items are noted in HPI.    Objective:  BP 139/65 mmHg  Pulse 75  Ht 5' 2.75" (1.594 m)  Wt 104 lb (47.174 kg)  BMI 18.57 kg/m2 General Appearance:    Alert, cooperative, no distress, appears stated age  Head:    Normocephalic, without obvious abnormality, atraumatic  Eyes:    conjunctiva/corneas clear, EOM's intact, both eyes  Ears:    Normal external ear canals, both ears  Nose:   Nares normal, septum midline, mucosa normal, no drainage    or sinus tenderness  Throat:   Lips, mucosa, and tongue normal; teeth and gums normal  Neck:   Supple, symmetrical, trachea midline, no adenopathy;    thyroid:  no enlargement/tenderness/nodules  Back:     Symmetric, no curvature, ROM normal, no CVA tenderness  Lungs:     Clear to auscultation  bilaterally, respirations unlabored  Chest Wall:    No tenderness or deformity   Heart:    Regular rate and rhythm, S1 and S2 normal, no murmur, rub   or gallop  Breast Exam:    No tenderness, masses, or nipple abnormality  Abdomen:     Soft, non-tender, bowel sounds active all four quadrants,    no masses, no organomegaly  Genitalia:    Normal female without lesion, discharge or tenderness; mild atrophy.  Uterus and cervix surgically absent.    Sebaceous cyst on vulva- a waxy substance was expressed and the lesion resolved.   Extremities:   Extremities normal, atraumatic, no cyanosis or edema  Pulses:   2+ and symmetric all extremities  Skin:   Skin color, texture, turgor normal, no rashes or lesions      Assessment:    Healthy female exam.   Screening breast CA- it is in her 8's but, has good life expectancy and requests a mammogram  Sebaceous cyst on vulva-treated   After chart review pt has h/o osteopenia   Plan:    Mammogram ordered.    F/u in 1 year Needs Dexa scan  Carolyn L. Harraway-Smith, M.D., Cherlynn June

## 2014-11-16 NOTE — Patient Instructions (Signed)
Mammography Mammography is an X-ray of the breasts to look for changes that are not normal. The X-ray image is called a mammogram. This procedure can screen for breast cancer, can detect cancer early, and can diagnose cancer.  LET YOUR CAREGIVER KNOW ABOUT:  Breast implants.  Previous breast disease, biopsy, or surgery.  If you are breastfeeding.  Medicines taken, including vitamins, herbs, eyedrops, over-the-counter medicines, and creams.  Use of steroids (by mouth or creams).  Possibility of pregnancy, if this applies. RISKS AND COMPLICATIONS  Exposure to radiation, but at very low levels.  The results may be misinterpreted.  The results may not be accurate.  Mammography may lead to further tests.  Mammography may not catch certain cancers. BEFORE THE PROCEDURE  Schedule your test about 7 days after your menstrual period. This is when your breasts are the least tender and have signs of hormone changes.  If you have had a mammography done at a different facility in the past, get the mammogram X-rays or have them sent to your current exam facility in order to compare them.  Wash your breasts and under your arms the day of the test.  Do not wear deodorants, perfumes, or powders anywhere on your body.  Wear clothes that you can change in and out of easily. PROCEDURE Relax as much as possible during the test. Any discomfort during the test will be very brief. The test should take less than 30 minutes. The following will happen:  You will undress from the waist up and put on a gown.  You will stand in front of the X-ray machine.  Each breast will be placed between 2 plastic or glass plates. The plates will compress your breast for a few seconds.  X-rays will be taken from different angles of the breast. AFTER THE PROCEDURE  The mammogram will be examined.  Depending on the quality of the images, you may need to repeat certain parts of the test.  Ask when your test  results will be ready. Make sure you get your test results.  You may resume normal activities. Document Released: 02/01/2000 Document Revised: 04/28/2011 Document Reviewed: 11/24/2010 ExitCare Patient Information 2015 [employer]. This information is not intended to replace advice given to you by your health care Rasha Ibe. Make sure you discuss any questions you have with your health care Llana Deshazo.  

## 2014-11-24 ENCOUNTER — Ambulatory Visit (HOSPITAL_BASED_OUTPATIENT_CLINIC_OR_DEPARTMENT_OTHER)
Admission: RE | Admit: 2014-11-24 | Discharge: 2014-11-24 | Disposition: A | Discharge status: Home / Self Care | Payer: Medicare PPO | Source: Ambulatory Visit | Attending: Obstetrics & Gynecology | Admitting: Obstetrics & Gynecology

## 2014-11-24 DIAGNOSIS — Z1231 Encounter for screening mammogram for malignant neoplasm of breast: Secondary | ICD-10-CM | POA: Diagnosis not present

## 2014-11-24 DIAGNOSIS — Z1239 Encounter for other screening for malignant neoplasm of breast: Secondary | ICD-10-CM

## 2014-12-13 DIAGNOSIS — M5417 Radiculopathy, lumbosacral region: Secondary | ICD-10-CM | POA: Diagnosis not present

## 2014-12-13 DIAGNOSIS — M47817 Spondylosis without myelopathy or radiculopathy, lumbosacral region: Secondary | ICD-10-CM | POA: Diagnosis not present

## 2014-12-21 DIAGNOSIS — M419 Scoliosis, unspecified: Secondary | ICD-10-CM | POA: Diagnosis not present

## 2014-12-21 DIAGNOSIS — M545 Low back pain: Secondary | ICD-10-CM | POA: Diagnosis not present

## 2014-12-21 DIAGNOSIS — M5416 Radiculopathy, lumbar region: Secondary | ICD-10-CM | POA: Diagnosis not present

## 2014-12-21 DIAGNOSIS — M5136 Other intervertebral disc degeneration, lumbar region: Secondary | ICD-10-CM | POA: Diagnosis not present

## 2014-12-31 ENCOUNTER — Encounter: Payer: Self-pay | Admitting: Family Medicine

## 2015-01-02 ENCOUNTER — Ambulatory Visit (INDEPENDENT_AMBULATORY_CARE_PROVIDER_SITE_OTHER): Payer: Medicare PPO

## 2015-01-02 ENCOUNTER — Telehealth: Payer: Self-pay | Admitting: Family Medicine

## 2015-01-02 DIAGNOSIS — G253 Myoclonus: Secondary | ICD-10-CM | POA: Diagnosis not present

## 2015-01-02 DIAGNOSIS — M47817 Spondylosis without myelopathy or radiculopathy, lumbosacral region: Secondary | ICD-10-CM | POA: Diagnosis not present

## 2015-01-02 DIAGNOSIS — Z23 Encounter for immunization: Secondary | ICD-10-CM | POA: Diagnosis not present

## 2015-01-02 DIAGNOSIS — M461 Sacroiliitis, not elsewhere classified: Secondary | ICD-10-CM | POA: Diagnosis not present

## 2015-01-02 DIAGNOSIS — M5417 Radiculopathy, lumbosacral region: Secondary | ICD-10-CM | POA: Diagnosis not present

## 2015-01-02 DIAGNOSIS — M419 Scoliosis, unspecified: Secondary | ICD-10-CM | POA: Diagnosis not present

## 2015-01-02 NOTE — Progress Notes (Signed)
Pt tolerated injection well

## 2015-01-02 NOTE — Progress Notes (Signed)
Pre visit review using our clinic review tool, if applicable. No additional management support is needed unless otherwise documented below in the visit note. 

## 2015-01-02 NOTE — Telephone Encounter (Signed)
Recd mychart msg to schedule flu shot and change to Dr. Etter Sjogren. Pt due for CPE. Schedule with Dr. Etter Sjogren.

## 2015-01-19 DIAGNOSIS — M5136 Other intervertebral disc degeneration, lumbar region: Secondary | ICD-10-CM | POA: Diagnosis not present

## 2015-01-19 DIAGNOSIS — M4316 Spondylolisthesis, lumbar region: Secondary | ICD-10-CM | POA: Diagnosis not present

## 2015-01-19 DIAGNOSIS — M419 Scoliosis, unspecified: Secondary | ICD-10-CM | POA: Diagnosis not present

## 2015-01-19 DIAGNOSIS — M47816 Spondylosis without myelopathy or radiculopathy, lumbar region: Secondary | ICD-10-CM | POA: Diagnosis not present

## 2015-01-30 DIAGNOSIS — M47816 Spondylosis without myelopathy or radiculopathy, lumbar region: Secondary | ICD-10-CM | POA: Diagnosis not present

## 2015-02-01 ENCOUNTER — Encounter (HOSPITAL_COMMUNITY): Payer: Self-pay

## 2015-02-01 ENCOUNTER — Other Ambulatory Visit (HOSPITAL_BASED_OUTPATIENT_CLINIC_OR_DEPARTMENT_OTHER): Payer: Medicare PPO

## 2015-02-01 ENCOUNTER — Ambulatory Visit (HOSPITAL_COMMUNITY)
Admission: RE | Admit: 2015-02-01 | Discharge: 2015-02-01 | Disposition: A | Discharge status: Home / Self Care | Payer: Medicare PPO | Source: Ambulatory Visit | Attending: Internal Medicine | Admitting: Internal Medicine

## 2015-02-01 DIAGNOSIS — C3411 Malignant neoplasm of upper lobe, right bronchus or lung: Secondary | ICD-10-CM

## 2015-02-01 DIAGNOSIS — I77810 Thoracic aortic ectasia: Secondary | ICD-10-CM | POA: Diagnosis not present

## 2015-02-01 DIAGNOSIS — C3490 Malignant neoplasm of unspecified part of unspecified bronchus or lung: Secondary | ICD-10-CM | POA: Diagnosis not present

## 2015-02-01 DIAGNOSIS — Z85118 Personal history of other malignant neoplasm of bronchus and lung: Secondary | ICD-10-CM | POA: Diagnosis not present

## 2015-02-01 LAB — COMPREHENSIVE METABOLIC PANEL
ALBUMIN: 3.7 g/dL (ref 3.5–5.0)
ALT: 18 U/L (ref 0–55)
AST: 30 U/L (ref 5–34)
Alkaline Phosphatase: 47 U/L (ref 40–150)
Anion Gap: 7 mEq/L (ref 3–11)
BUN: 19.1 mg/dL (ref 7.0–26.0)
CHLORIDE: 96 meq/L — AB (ref 98–109)
CO2: 27 mEq/L (ref 22–29)
Calcium: 9.1 mg/dL (ref 8.4–10.4)
Creatinine: 1.1 mg/dL (ref 0.6–1.1)
EGFR: 49 mL/min/{1.73_m2} — ABNORMAL LOW (ref >90)
GLUCOSE: 82 mg/dL (ref 70–140)
POTASSIUM: 4.4 meq/L (ref 3.5–5.1)
SODIUM: 130 meq/L — AB (ref 136–145)
Total Bilirubin: <0.3 mg/dL (ref 0.20–1.20)
Total Protein: 7 g/dL (ref 6.4–8.3)

## 2015-02-01 LAB — CBC WITH DIFFERENTIAL/PLATELET
BASO%: 0.6 % (ref 0.0–2.0)
Basophils Absolute: 0 10*3/uL (ref 0.0–0.1)
EOS%: 0.4 % (ref 0.0–7.0)
Eosinophils Absolute: 0 10*3/uL (ref 0.0–0.5)
HEMATOCRIT: 33.1 % — AB (ref 34.8–46.6)
HEMOGLOBIN: 10.8 g/dL — AB (ref 11.6–15.9)
LYMPH#: 1.1 10*3/uL (ref 0.9–3.3)
LYMPH%: 21.6 % (ref 14.0–49.7)
MCH: 29.3 pg (ref 25.1–34.0)
MCHC: 32.6 g/dL (ref 31.5–36.0)
MCV: 89.8 fL (ref 79.5–101.0)
MONO#: 0.5 10*3/uL (ref 0.1–0.9)
MONO%: 10.1 % (ref 0.0–14.0)
NEUT%: 67.3 % (ref 38.4–76.8)
NEUTROS ABS: 3.5 10*3/uL (ref 1.5–6.5)
Platelets: 173 10*3/uL (ref 145–400)
RBC: 3.68 10*6/uL — ABNORMAL LOW (ref 3.70–5.45)
RDW: 13.3 % (ref 11.2–14.5)
WBC: 5.2 10*3/uL (ref 3.9–10.3)

## 2015-02-01 MED ORDER — IOHEXOL 300 MG/ML  SOLN
75.0000 mL | Freq: Once | INTRAMUSCULAR | Status: AC | PRN
  Administered 2015-02-01: 75 mL via INTRAVENOUS

## 2015-02-06 DIAGNOSIS — H26491 Other secondary cataract, right eye: Secondary | ICD-10-CM | POA: Diagnosis not present

## 2015-02-06 DIAGNOSIS — H35383 Toxic maculopathy, bilateral: Secondary | ICD-10-CM | POA: Diagnosis not present

## 2015-02-06 DIAGNOSIS — Z79899 Other long term (current) drug therapy: Secondary | ICD-10-CM | POA: Diagnosis not present

## 2015-02-06 DIAGNOSIS — Z961 Presence of intraocular lens: Secondary | ICD-10-CM | POA: Diagnosis not present

## 2015-02-06 DIAGNOSIS — M3500 Sicca syndrome, unspecified: Secondary | ICD-10-CM | POA: Diagnosis not present

## 2015-02-06 DIAGNOSIS — H16223 Keratoconjunctivitis sicca, not specified as Sjogren's, bilateral: Secondary | ICD-10-CM | POA: Diagnosis not present

## 2015-02-06 DIAGNOSIS — I7300 Raynaud's syndrome without gangrene: Secondary | ICD-10-CM | POA: Diagnosis not present

## 2015-02-06 DIAGNOSIS — R600 Localized edema: Secondary | ICD-10-CM | POA: Diagnosis not present

## 2015-02-08 ENCOUNTER — Ambulatory Visit (HOSPITAL_BASED_OUTPATIENT_CLINIC_OR_DEPARTMENT_OTHER): Payer: Medicare PPO | Admitting: Internal Medicine

## 2015-02-08 ENCOUNTER — Encounter: Payer: Self-pay | Admitting: Internal Medicine

## 2015-02-08 VITALS — BP 148/73 | HR 86 | Temp 98.9°F | Resp 18 | Ht 62.75 in | Wt 104.6 lb

## 2015-02-08 DIAGNOSIS — C3411 Malignant neoplasm of upper lobe, right bronchus or lung: Secondary | ICD-10-CM

## 2015-02-08 NOTE — Progress Notes (Signed)
[facility] [phone]   [phone]  OFFICE PROGRESS NOTE  [name], MD [address] [place] [address]  DIAGNOSIS: Stage IIA (T2b, N0, M0) non-small cell lung cancer, adenocarcinoma with positive EGFR mutation in exon 21 ([id number]) presented with right upper lobe lung mass diagnosed in March of 2015.  PRIOR THERAPY: Status post right upper lobectomy with wedge resection of the right middle lobe under the care of Dr. Servando Snare on 06/07/2013  CURRENT THERAPY: Observation.  INTERVAL HISTORY: [name] 81 y.o. female returns to the clinic today for followup visit. The patient has been on observation for the last 18 months. She is feeling fine today with no specific complaints. She denied having any significant weakness or fatigue. She has no nausea or vomiting. She has no fever or chills. She denied having any significant chest pain, shortness of breath, cough or hemoptysis. She had repeat CT scan of the chest performed recently and she is here for evaluation and discussion of her scan results.   MEDICAL HISTORY: Past Medical History  Diagnosis Date  . Iron deficiency anemia   . Sjogren's syndrome ([place])   . Abnormal ECG 11/2008    left bbb  . Raynaud's phenomenon 1965  . Arthritis   . Colon polyps   . Constipation     takes Colace daily  . GERD (gastroesophageal reflux disease)     takes Omeprazole daily  . Dry eyes     eye drops used   . Rheumatoid arthritis ([place])   . Back pain     scoliosis  . Osteoporosis   . Sciatica   . Lupus ([place])     takes Plaquenil daily  . PONV (postoperative nausea and vomiting)     likes phenergan  . Cancer ([place]) 2015    lung    ALLERGIES:  is allergic to prochlorperazine edisylate and aspirin.  MEDICATIONS:  Current Outpatient Prescriptions  Medication Sig Dispense Refill  . Biotin 1000 MCG tablet Take 1,000 mcg by mouth daily.     . Calcium Carbonate-Vitamin D  (CALCIUM 500 + D) 500-125 MG-UNIT TABS Take 1 tablet by mouth daily.     Marland Kitchen docusate sodium (STOOL SOFTENER) 100 MG capsule Take 100 mg by mouth 2 (two) times daily.      Marland Kitchen ESTRACE VAGINAL 0.1 MG/GM vaginal cream USE AS DIRECTED 42.5 g 1  . gabapentin (NEURONTIN) 300 MG capsule Take 300 mg by mouth 2 (two) times daily.    . Glucosamine-Chondroit-Vit C-Mn (GLUCOSAMINE CHONDR 1500 COMPLX PO) Take 1 capsule by mouth daily.     . meloxicam (MOBIC) 7.5 MG tablet Take 7.5 mg by mouth 2 (two) times daily as needed for pain.    . Multiple Vitamin (MULTIVITAMIN) tablet Take 1 tablet by mouth daily.      Marland Kitchen omeprazole (PRILOSEC) 20 MG capsule TAKE 1 CAPSULE (20 MG TOTAL) BY MOUTH 2 (TWO) TIMES DAILY BEFORE A MEAL. 60 capsule 6  . spironolactone (ALDACTONE) 25 MG tablet Take 25 mg by mouth daily.  2   No current facility-administered medications for this visit.    SURGICAL HISTORY:  Past Surgical History  Procedure Laterality Date  . Appendectomy    . Tonsillectomy    . Total abdominal hysterectomy  1980's   . Thoracic sympathetectomy  1965  . Cataract extraction Bilateral   . Colonoscopy    . Cardiovascular stress test      12/2008 mild  fixed basal to mid septal perfusion defect felt likely due to artifact from LBBB, no ischemia, EF 58%  . Video bronchoscopy with endobronchial navigation N/A 05/04/2013    Procedure: VIDEO BRONCHOSCOPY WITH ENDOBRONCHIAL NAVIGATION;  Surgeon: [name], MD;  Location: [facility];  Service: Thoracic;  Laterality: N/A;  . Video bronchoscopy N/A 06/07/2013    Procedure: VIDEO BRONCHOSCOPY;  Surgeon: [name], MD;  Location: [facility] OR;  Service: Thoracic;  Laterality: N/A;  . Video assisted thoracoscopy (vats)/wedge resection Right 06/07/2013    Procedure: VIDEO ASSISTED THORACOSCOPY (VATS)/right upper lobectomy, On Q;  Surgeon: [name], MD;  Location: [facility];  Service: Thoracic;  Laterality: Right;  . Thoracotomy  06/07/2013    Procedure: MINI/LIMITED  THORACOTOMY; right middle lobe wedge resection;  Surgeon: [name], MD;  Location[facility];  Service: Thoracic;;  . Lymph node dissection Right 06/07/2013    Procedure: LYMPH NODE DISSECTION;  Surgeon: [name], MD;  Location: [facility];  Service: Thoracic;  Laterality: Right;  . Total hip arthroplasty Right 04/28/2014    Procedure: RIGHT TOTAL HIP ARTHROPLASTY ANTERIOR APPROACH;  Surgeon: [name], MD;  Location: [facility];  Service: Orthopedics;  Laterality: Right;    REVIEW OF SYSTEMS:  A comprehensive review of systems was negative.   PHYSICAL EXAMINATION: General appearance: alert, cooperative and no distress Head: Normocephalic, without obvious abnormality, atraumatic Neck: no adenopathy, no JVD, supple, symmetrical, trachea midline and thyroid not enlarged, symmetric, no tenderness/mass/nodules Lymph nodes: Cervical, supraclavicular, and axillary nodes normal. Resp: clear to auscultation bilaterally Back: symmetric, no curvature. ROM normal. No CVA tenderness. Cardio: regular rate and rhythm, S1, S2 normal, no murmur, click, rub or gallop GI: soft, non-tender; bowel sounds normal; no masses,  no organomegaly Extremities: extremities normal, atraumatic, no cyanosis or edema Neurologic: Alert and oriented X 3, normal strength and tone. Normal symmetric reflexes. Normal coordination and gait  ECOG PERFORMANCE STATUS: 1 - Symptomatic but completely ambulatory  Blood pressure 148/73, pulse 86, temperature 98.9 F (37.2 C), temperature source Oral, resp. rate 18, height 5' 2.75" (1.594 m), weight 104 lb 9.6 oz (47.446 kg), SpO2 100 %.  LABORATORY DATA: Lab Results  Component Value Date   WBC 5.2 02/01/2015   HGB 10.8* 02/01/2015   HCT 33.1* 02/01/2015   MCV 89.8 02/01/2015   PLT 173 02/01/2015      Chemistry      Component Value Date/Time   NA 130* 02/01/2015 1041   NA 131* 08/09/2014 1534   K 4.4 02/01/2015 1041   K 4.7 08/09/2014 1534   CL 98  08/09/2014 1534   CO2 27 02/01/2015 1041   CO2 28 08/09/2014 1534   BUN 19.1 02/01/2015 1041   BUN 27* 08/09/2014 1534   CREATININE 1.1 02/01/2015 1041   CREATININE 1.13 08/09/2014 1534   CREATININE 1.15* 11/11/2013 1557      Component Value Date/Time   CALCIUM 9.1 02/01/2015 1041   CALCIUM 9.4 08/09/2014 1534   ALKPHOS 47 02/01/2015 1041   ALKPHOS 51 07/29/2014 0920   AST 30 02/01/2015 1041   AST 31 07/29/2014 0920   ALT 18 02/01/2015 1041   ALT 15 07/29/2014 0920   BILITOT <0.30 02/01/2015 1041   BILITOT 0.3 07/29/2014 0920       RADIOGRAPHIC STUDIES: Ct Chest W Contrast  02/01/2015  CLINICAL DATA:  Lung cancer initially diagnosed in April 2015. History of resection. EXAM: CT CHEST WITH CONTRAST TECHNIQUE: Multidetector CT imaging of the chest  was performed during intravenous contrast administration. CONTRAST:  35m OMNIPAQUE IOHEXOL 300 MG/ML  SOLN COMPARISON:  08/02/2014 and 01/23/2014 FINDINGS: Mediastinum/Nodes: No breast masses, supraclavicular or axillary lymphadenopathy. The thyroid gland appears normal. The heart is normal in size. No pericardial effusion. Stable mild fusiform ectasia of the ascending aorta. No dissection. The pulmonary arteries appear normal. No mediastinal or hilar mass or lymphadenopathy. The esophagus is grossly normal. There is a small hiatal hernia. Lungs/Pleura: Stable 3 mm left upper lobe pulmonary nodule on image 12. There is also a stable 3 mm right upper lobe nodule on image 11. No new pulmonary lesions. Stable bibasilar atelectasis and scarring. No pleural effusion. Stable apical pleural thickening and postoperative changes involving the right lung apex. Upper abdomen: No significant upper abdominal findings. Musculoskeletal: No significant bony findings. IMPRESSION: Stable CT appearance of the chest. No findings for residual, recurrent or metastatic disease. No mediastinal or hilar mass or adenopathy. Stable small upper lobe pulmonary nodules. Stable  mild tortuosity and ectasia of the thoracic aorta. Electronically Signed   By: PMarijo SanesM.D.   On: 02/01/2015 13:17   ASSESSMENT AND PLAN: This is a very pleasant 81years old white female recently diagnosed with a stage IIA non-small cell lung cancer, adenocarcinoma with positive EGFR mutation in exon 21. She declined adjuvant chemotherapy. The recent CT scan of the chest showed no evidence for disease recurrence. I discussed the scan results with the patient today. I recommended for her to continue on observation with repeat CT scan of the chest in 6 months. She was advised to call immediately if she has any concerning symptoms in the interval. The patient voices understanding of current disease status and treatment options and is in agreement with the current care plan.  All questions were answered. The patient knows to call the clinic with any problems, questions or concerns. We can certainly see the patient much sooner if necessary.  Disclaimer: This note was dictated with voice recognition software. Similar sounding words can inadvertently be transcribed and may not be corrected upon review.

## 2015-02-12 ENCOUNTER — Other Ambulatory Visit: Payer: Self-pay | Admitting: Family Medicine

## 2015-02-13 NOTE — Telephone Encounter (Signed)
Medication filled to pharmacy as requested.   

## 2015-02-22 ENCOUNTER — Ambulatory Visit (INDEPENDENT_AMBULATORY_CARE_PROVIDER_SITE_OTHER): Payer: Medicare Other | Admitting: Cardiothoracic Surgery

## 2015-02-22 ENCOUNTER — Encounter: Payer: Self-pay | Admitting: Cardiothoracic Surgery

## 2015-02-22 VITALS — BP 144/80 | HR 77 | Resp 20 | Ht 62.0 in | Wt 105.0 lb

## 2015-02-22 DIAGNOSIS — C3411 Malignant neoplasm of upper lobe, right bronchus or lung: Secondary | ICD-10-CM

## 2015-02-22 DIAGNOSIS — Z902 Acquired absence of lung [part of]: Secondary | ICD-10-CM | POA: Diagnosis not present

## 2015-02-22 NOTE — Progress Notes (Signed)
[facility] Record #[id number] Date of Birth: Aug 28, 1930  Referring UR:[id number], Gunnar Fusi, MD Primary Cardiology: Primary Care:Katherine Birdie Riddle, MD  Chief Complaint:  Follow Up Visit  Surgery: 06/07/2013   OPERATIVE REPORT  PREOPERATIVE DIAGNOSIS: Right upper lobe adenocarcinoma of the lung.  POSTOPERATIVE DIAGNOSIS: Right upper lobe adenocarcinoma of the lung.  SURGICAL PROCEDURES: Video bronchoscopy, right video-assisted  thoracoscopy, mini-thoracotomy, right upper lobectomy with lymph node  dissection, wedge resection of right middle lobe, placement of On-Q.  SURGEON: Lanelle Bal, MD.  Lung cancer, Right upper lobe   Primary site: Lung (Right)   Staging method: AJCC 7th Edition   Clinical: Stage IIA (T2b, N0, M0) signed by [name], MD on 05/08/2013 10:00 PM   Pathologic: Stage IIA (T2b, N0, cM0) signed by [name], MD on 06/13/2013 10:49 AM   Summary: Stage IIA (T2b, N0, cM0)  History of Present Illness:     The patient is an 82 year old female who  presents for routine follow up s/p right mini thoracotomy and right upper lobectomy and wedge resection of the right middle lobe on 06/07/2013.Marland Kitchen Her rheumatoid arthritis is limiting her overall activity. Since last seen she has had a right hip replacement. She continues to have chronic back pain from her scoliosis, is planning to have laser intervention in the coming several weeks on her back.  Zubrod Score: At the time of surgery this patient's most appropriate activity status/level should be described as: '[]'$     0    Normal activity, no symptoms '[x]'$     1    Restricted in physical strenuous activity but ambulatory, able to do out light work '[]'$     2    Ambulatory and capable of self care, unable to do work activities, up and about                 >50 % of waking hours                                                                                   '[]'$     3    Only  limited self care, in bed greater than 50% of waking hours '[]'$     4    Completely disabled, no self care, confined to bed or chair '[]'$     5    Moribund  History  Smoking status  . Never Smoker   Smokeless tobacco  . Never Used       Allergies  Allergen Reactions  . Prochlorperazine Edisylate Anaphylaxis    Compazine  . Aspirin     REACTION: nose bleeds    Current Outpatient Prescriptions  Medication Sig Dispense Refill  . Biotin 1000 MCG tablet Take 1,000 mcg by mouth daily.     . Calcium Carbonate-Vitamin D (CALCIUM 500 + D) 500-125 MG-UNIT TABS Take 1 tablet by mouth daily.     Marland Kitchen docusate sodium (STOOL SOFTENER) 100 MG capsule Take 100 mg by mouth 2 (two) times daily.      Marland Kitchen ESTRACE VAGINAL 0.1 MG/GM vaginal cream  USE AS DIRECTED 42.5 g 2  . gabapentin (NEURONTIN) 300 MG capsule Take 300 mg by mouth 2 (two) times daily.    . Glucosamine-Chondroit-Vit C-Mn (GLUCOSAMINE CHONDR 1500 COMPLX PO) Take 1 capsule by mouth daily.     . meloxicam (MOBIC) 7.5 MG tablet Take 7.5 mg by mouth 2 (two) times daily as needed for pain.    . Multiple Vitamin (MULTIVITAMIN) tablet Take 1 tablet by mouth daily.      Marland Kitchen omeprazole (PRILOSEC) 20 MG capsule TAKE 1 CAPSULE (20 MG TOTAL) BY MOUTH 2 (TWO) TIMES DAILY BEFORE A MEAL. 60 capsule 6  . spironolactone (ALDACTONE) 25 MG tablet Take 25 mg by mouth daily.  2   No current facility-administered medications for this visit.     Physical Exam: BP 144/80 mmHg  Pulse 77  Resp 20  Ht '5\' 2"'$  (1.575 m)  Wt 105 lb (47.628 kg)  BMI 19.20 kg/m2  SpO2 98%  General appearance: alert, cooperative and no distress Heart: regular rate and rhythm Lungs: left lung is clear; diminished at right apex but right base is clear Abdomen: Soft, minimal tenderness with palpation to right upper quadrant, minor distention, bowel sounds present Extremities: no edema Wound: Completely healed Some very mild edema laft foot more then right  Diagnostic Studies &  Laboratory data:         Recent Radiology Findings:  Ct Chest W Contrast  02/01/2015  CLINICAL DATA:  Lung cancer initially diagnosed in April 2015. History of resection. EXAM: CT CHEST WITH CONTRAST TECHNIQUE: Multidetector CT imaging of the chest was performed during intravenous contrast administration. CONTRAST:  20m OMNIPAQUE IOHEXOL 300 MG/ML  SOLN COMPARISON:  08/02/2014 and 01/23/2014 FINDINGS: Mediastinum/Nodes: No breast masses, supraclavicular or axillary lymphadenopathy. The thyroid gland appears normal. The heart is normal in size. No pericardial effusion. Stable mild fusiform ectasia of the ascending aorta. No dissection. The pulmonary arteries appear normal. No mediastinal or hilar mass or lymphadenopathy. The esophagus is grossly normal. There is a small hiatal hernia. Lungs/Pleura: Stable 3 mm left upper lobe pulmonary nodule on image 12. There is also a stable 3 mm right upper lobe nodule on image 11. No new pulmonary lesions. Stable bibasilar atelectasis and scarring. No pleural effusion. Stable apical pleural thickening and postoperative changes involving the right lung apex. Upper abdomen: No significant upper abdominal findings. Musculoskeletal: No significant bony findings. IMPRESSION: Stable CT appearance of the chest. No findings for residual, recurrent or metastatic disease. No mediastinal or hilar mass or adenopathy. Stable small upper lobe pulmonary nodules. Stable mild tortuosity and ectasia of the thoracic aorta. Electronically Signed   By: PMarijo SanesM.D.   On: 02/01/2015 13:17  CLINICAL DATA: Restaging right lung cancer. Original diagnosis April 2015. Right upper lobectomy.  EXAM: CT CHEST WITH CONTRAST  TECHNIQUE: Multidetector CT imaging of the chest was performed during intravenous contrast administration.  CONTRAST: 871mOMNIPAQUE IOHEXOL 300 MG/ML SOLN  COMPARISON: Multiple exams, including 01/23/2014  FINDINGS: Mediastinum/Nodes: No adenopathy.  Mild distortion due to prior right-sided surgery.  Lungs/Pleura: Wedge resection clips on the right. Scarring and interstitial accentuation at the right lung apex.  4 mm nodule left upper lobe, image 14 series 6, no change from 12/12/2008  Upper abdomen: 0.6 by 0.4 cm small enhancing nodule in segment 7, image 50 series 2 is stable.  Musculoskeletal: Degenerative disc disease at L2-3.  IMPRESSION: 1. No recurrence identified. 2. Benign 4 mm left upper lobe nodule, no change from 2010. 3. Average size  5 mm small enhancing nodule in segment 7 of the liver, no change from prior, probably incidental/benign, merits observation.   Electronically Signed  By: Van Clines M.D.  On: 08/02/2014 14:02    CLINICAL DATA: Lung cancer. Right upper lobectomy. Weight loss. Lupus. Shortness of breath on exertion.  EXAM: CT CHEST WITH CONTRAST  TECHNIQUE: Multidetector CT imaging of the chest was performed during intravenous contrast administration.  CONTRAST: 23m OMNIPAQUE IOHEXOL 300 MG/ML SOLN  COMPARISON: 04/22/2013 ; 04/27/2013  FINDINGS: Right upper lobectomy with a small amount of fluid in the pleural space at the right lung apex, and volume loss in the right hemithorax.  No pathologic thoracic adenopathy. Flat web like filling defect along the upper margin the right pulmonary artery, image 26 series 2, possibly due to an infolding of the vessel wall related to rotation/traction, less likely to be chronic pulmonary embolus.  7 by 4 mm focus of arterial phase enhancement posteriorly in segment 7 of the liver, image 47 series 2, stable from 03/25/2013.  Scarring at the apex of the right lung with adjacent resection clips. Reticulonodular interstitial opacity posteriorly in the right lung. Mild scarring in the left lower lobe posteriorly.  IMPRESSION: 1. No findings of recurrent malignancy at this time. Postoperative findings in the right  chest. 2. Faint inflammatory reticulonodular interstitial accentuation in the right lung, potentially from atypical infectious bronchiolitis. 3. Flat web like filling defect along the upper margin of the right pulmonary artery, probably from infolding of the vessel related to rotation or traction. 4. Small amount of pleural fluid at the right lung apex. 5. 7 by 4 mm focus of arterial phase enhancement in segment 7 of the liver, stable from 03/25/2013, probably benign lesion such as hemangioma flash filling, but warranting observation on any subsequent imaging studies.   Electronically Signed  By: WSherryl BartersM.D.  On: 01/23/2014 17:55  I have independently reviewed the above radiology studies  and reviewed the findings with the patient.   Recent Labs: Lab Results  Component Value Date   WBC 5.2 02/01/2015   HGB 10.8* 02/01/2015   HCT 33.1* 02/01/2015   PLT 173 02/01/2015   GLUCOSE 82 02/01/2015   CHOL 204* 11/11/2013   TRIG 72 11/11/2013   HDL 86 11/11/2013   LDLDIRECT 100.9 08/13/2011   LDLCALC 104* 11/11/2013   ALT 18 02/01/2015   AST 30 02/01/2015   NA 130* 02/01/2015   K 4.4 02/01/2015   CL 98 08/09/2014   CREATININE 1.1 02/01/2015   BUN 19.1 02/01/2015   CO2 27 02/01/2015   TSH 2.859 11/11/2013   INR 0.97 04/21/2014      Assessment / Plan:   Stable following right lung resection without evidence of recurrence by CT under observational treatment currently. Now 20 months post op Plan to see her back in 6 months   EGrace IsaacMD      [address] [place] [address]Office [phone]  Beeper [phone]

## 2015-05-01 ENCOUNTER — Other Ambulatory Visit: Payer: Self-pay | Admitting: Family Medicine

## 2015-05-02 NOTE — Telephone Encounter (Signed)
Medication filled to pharmacy as requested.   

## 2015-05-15 ENCOUNTER — Encounter: Payer: Self-pay | Admitting: Family Medicine

## 2015-05-22 ENCOUNTER — Encounter: Payer: Self-pay | Admitting: Family Medicine

## 2015-05-22 ENCOUNTER — Ambulatory Visit (INDEPENDENT_AMBULATORY_CARE_PROVIDER_SITE_OTHER): Payer: Medicare Other | Admitting: Family Medicine

## 2015-05-22 ENCOUNTER — Encounter: Payer: Medicare PPO | Admitting: Family Medicine

## 2015-05-22 VITALS — BP 140/82 | HR 72 | Temp 98.0°F | Resp 16 | Ht 62.0 in | Wt 105.2 lb

## 2015-05-22 DIAGNOSIS — IMO0001 Reserved for inherently not codable concepts without codable children: Secondary | ICD-10-CM

## 2015-05-22 DIAGNOSIS — M8580 Other specified disorders of bone density and structure, unspecified site: Secondary | ICD-10-CM

## 2015-05-22 DIAGNOSIS — Z23 Encounter for immunization: Secondary | ICD-10-CM

## 2015-05-22 DIAGNOSIS — Z0000 Encounter for general adult medical examination without abnormal findings: Secondary | ICD-10-CM | POA: Diagnosis not present

## 2015-05-22 DIAGNOSIS — M069 Rheumatoid arthritis, unspecified: Secondary | ICD-10-CM

## 2015-05-22 DIAGNOSIS — R030 Elevated blood-pressure reading, without diagnosis of hypertension: Secondary | ICD-10-CM | POA: Diagnosis not present

## 2015-05-22 DIAGNOSIS — E43 Unspecified severe protein-calorie malnutrition: Secondary | ICD-10-CM | POA: Diagnosis not present

## 2015-05-22 LAB — LIPID PANEL
CHOLESTEROL: 208 mg/dL — AB (ref 0–200)
HDL: 68.1 mg/dL (ref >39.00)
LDL CALC: 120 mg/dL — AB (ref 0–99)
NonHDL: 139.51
TRIGLYCERIDES: 98 mg/dL (ref 0.0–149.0)
Total CHOL/HDL Ratio: 3
VLDL: 19.6 mg/dL (ref 0.0–40.0)

## 2015-05-22 LAB — BASIC METABOLIC PANEL
BUN: 27 mg/dL — AB (ref 6–23)
CO2: 31 mEq/L (ref 19–32)
CREATININE: 0.96 mg/dL (ref 0.40–1.20)
Calcium: 9.8 mg/dL (ref 8.4–10.5)
Chloride: 95 mEq/L — ABNORMAL LOW (ref 96–112)
GFR: 59.06 mL/min — AB (ref >60.00)
GLUCOSE: 91 mg/dL (ref 70–99)
POTASSIUM: 4.4 meq/L (ref 3.5–5.1)
Sodium: 130 mEq/L — ABNORMAL LOW (ref 135–145)

## 2015-05-22 LAB — HEPATIC FUNCTION PANEL
ALBUMIN: 4.4 g/dL (ref 3.5–5.2)
ALT: 12 U/L (ref 0–35)
AST: 26 U/L (ref 0–37)
Alkaline Phosphatase: 44 U/L (ref 39–117)
Bilirubin, Direct: 0.1 mg/dL (ref 0.0–0.3)
Total Bilirubin: 0.5 mg/dL (ref 0.2–1.2)
Total Protein: 7.4 g/dL (ref 6.0–8.3)

## 2015-05-22 LAB — CBC WITH DIFFERENTIAL/PLATELET
BASOS ABS: 0 10*3/uL (ref 0.0–0.1)
Basophils Relative: 0.5 % (ref 0.0–3.0)
Eosinophils Absolute: 0 10*3/uL (ref 0.0–0.7)
Eosinophils Relative: 0.7 % (ref 0.0–5.0)
HCT: 34.1 % — ABNORMAL LOW (ref 36.0–46.0)
Hemoglobin: 11.5 g/dL — ABNORMAL LOW (ref 12.0–15.0)
LYMPHS ABS: 1 10*3/uL (ref 0.7–4.0)
Lymphocytes Relative: 24.1 % (ref 12.0–46.0)
MCHC: 33.7 g/dL (ref 30.0–36.0)
MCV: 87.6 fl (ref 78.0–100.0)
MONO ABS: 0.4 10*3/uL (ref 0.1–1.0)
Monocytes Relative: 9.5 % (ref 3.0–12.0)
NEUTROS PCT: 65.2 % (ref 43.0–77.0)
Neutro Abs: 2.8 10*3/uL (ref 1.4–7.7)
Platelets: 168 10*3/uL (ref 150.0–400.0)
RBC: 3.89 Mil/uL (ref 3.87–5.11)
RDW: 13.9 % (ref 11.5–15.5)
WBC: 4.3 10*3/uL (ref 4.0–10.5)

## 2015-05-22 LAB — TSH: TSH: 4.33 u[IU]/mL (ref 0.35–4.50)

## 2015-05-22 LAB — VITAMIN D 25 HYDROXY (VIT D DEFICIENCY, FRACTURES): VITD: 48.88 ng/mL (ref 30.00–100.00)

## 2015-05-22 NOTE — Progress Notes (Signed)
Pre visit review using our clinic review tool, if applicable. No additional management support is needed unless otherwise documented below in the visit note. 

## 2015-05-22 NOTE — Patient Instructions (Signed)
Follow up in 6 months to recheck BP and cholesterol We'll notify you of your lab results and make any changes if needed Keep up the good work!  You look great! You are up to date on mammogram and there is no need for colonoscopy You got the Prevnar today- so you are up to date Call with any questions or concerns Thanks for sticking with [place]! Happy Spring!!!!

## 2015-05-22 NOTE — Progress Notes (Signed)
   Subjective:    Patient ID: [name], female    DOB: June 13, 1930, 82 y.o.   MRN: [id number]  HPI Here today for CPE.  Risk Factors: Rheumatoid Arthritis- pt is now getting 2 epidurals in her back monthly.  Still following w/ Dr Earnest Conroy- would like a copy of blood work done today.   Elevated BP- chronic problem, on Spironolactone per Rheum. Physical Activity: very active but no formal exercise due to RA Fall Risk: increased risk due to back pain Depression: denies current sxs Hearing: normal to conversational tones and whispered voice at 6 ft ADL's: independent Cognitive: normal linear thought process, memory and attention intact Home Safety: safe at home, lives w/ husband Height, Weight, BMI, Visual Acuity: see vitals, vision corrected to 20/20 w/ cataract surgery Counseling: UTD on mammo, no need for colonoscopy or pap.  Due for prevnar Care team reviewed and updated Labs Ordered: See A&P Care Plan: See A&P    Review of Systems Patient reports no vision/ hearing changes, adenopathy,fever, weight change,  persistant/recurrent hoarseness , swallowing issues, chest pain, palpitations, edema, persistant/recurrent cough, hemoptysis, dyspnea (rest/exertional/paroxysmal nocturnal), gastrointestinal bleeding (melena, rectal bleeding), abdominal pain, significant heartburn, bowel changes, GU symptoms (dysuria, hematuria, incontinence), Gyn symptoms (abnormal  bleeding, pain),  syncope, memory loss, numbness & tingling, skin/hair/nail changes, abnormal bruising or bleeding, anxiety, or depression.   + weakness of hands due to RA    Objective:   Physical Exam General Appearance:    Alert, cooperative, no distress, appears stated age  Head:    Normocephalic, without obvious abnormality, atraumatic  Eyes:    PERRL, conjunctiva/corneas clear, EOM's intact, fundi    benign, both eyes  Ears:    Normal TM's and external ear canals, both ears  Nose:   Nares normal, septum midline, mucosa normal,  no drainage    or sinus tenderness  Throat:   Lips, mucosa, and tongue normal; teeth and gums normal  Neck:   Supple, symmetrical, trachea midline, no adenopathy;    Thyroid: no enlargement/tenderness/nodules  Back:     Symmetric, no curvature, ROM normal, no CVA tenderness  Lungs:     Clear to auscultation bilaterally, respirations unlabored  Chest Wall:    No tenderness or deformity   Heart:    Regular rate and rhythm, S1 and S2 normal, no murmur, rub   or gallop  Breast Exam:    Deferred to mammo  Abdomen:     Soft, non-tender, bowel sounds active all four quadrants,    no masses, no organomegaly  Genitalia:    Deferred  Rectal:    Extremities:   Extremities normal, atraumatic, no cyanosis or edema  Pulses:   2+ and symmetric all extremities  Skin:   Skin color, texture, turgor normal, no rashes or lesions  Lymph nodes:   Cervical, supraclavicular, and axillary nodes normal  Neurologic:   CNII-XII intact, normal strength, sensation and reflexes    throughout          Assessment & Plan:

## 2015-05-25 ENCOUNTER — Telehealth: Payer: Self-pay | Admitting: Cardiology

## 2015-05-25 MED ORDER — AMLODIPINE BESYLATE 5 MG PO TABS: 5.0000 mg | ORAL_TABLET | Freq: Every day | ORAL | Status: DC

## 2015-05-25 NOTE — Telephone Encounter (Signed)
Pt's daughter called in stating that the pt had a spike in blood pressure (187/97) and the daughter would like to be advised on what to do because the pt's BP is usually low. Please f/u with her  Thanks

## 2015-05-25 NOTE — Telephone Encounter (Signed)
Add amlodipine 5 mg q hs [name]

## 2015-05-25 NOTE — Telephone Encounter (Signed)
Called pt back and pt had episode last night where she had gotten up to adjust the thermostat and then a few minutes later her palms became sweaty and she felt faint. She took her BP -187/90 per pt. She lied down and put her feet up. Her daughter came over. Her BP did come down to her normal around 140/80 (pt could not remember exact numbers). She says these are recurring episodes that usually happen in the middle of the night and she has them every couple months. During the day she feels fine and does not have any issues. Please address.  Will defer to Dr. Stanford Breed and Fredia Beets, RN.

## 2015-05-25 NOTE — Telephone Encounter (Signed)
Spoke with pt, Aware of dr crenshaw's recommendations. New script sent to the pharmacy  

## 2015-05-26 NOTE — Assessment & Plan Note (Signed)
Ongoing issue.  Check Vit D and replete prn.

## 2015-05-26 NOTE — Assessment & Plan Note (Signed)
Ongoing issue.  On Spironolactone per rheum.  Asymptomatic.  Check labs.  Continue to monitor.

## 2015-05-26 NOTE — Assessment & Plan Note (Signed)
Chronic problem.  Following w/ Rheum.  Will follow along and assist as able.

## 2015-05-26 NOTE — Assessment & Plan Note (Signed)
Pt remains very thin.  She is eating regularly.  Stressed need to add protein in both diet and form of supplements such as Ensure.  Will follow.

## 2015-05-26 NOTE — Assessment & Plan Note (Signed)
Pt's PE WNL w/ exception of known RA changes and scoliosis.  UTD on mammo- no need for colonoscopy or pap.  Prevnar given today.  Written screening schedule updated and given to pt.  Check labs.  Anticipatory guidance provided.

## 2015-07-13 ENCOUNTER — Encounter: Payer: Self-pay | Admitting: Family Medicine

## 2015-08-02 ENCOUNTER — Other Ambulatory Visit (HOSPITAL_BASED_OUTPATIENT_CLINIC_OR_DEPARTMENT_OTHER): Payer: Medicare Other

## 2015-08-02 ENCOUNTER — Ambulatory Visit (HOSPITAL_COMMUNITY)
Admission: RE | Admit: 2015-08-02 | Discharge: 2015-08-02 | Disposition: A | Discharge status: Home / Self Care | Payer: Medicare Other | Source: Ambulatory Visit | Attending: Internal Medicine | Admitting: Internal Medicine

## 2015-08-02 DIAGNOSIS — C3411 Malignant neoplasm of upper lobe, right bronchus or lung: Secondary | ICD-10-CM | POA: Diagnosis not present

## 2015-08-02 DIAGNOSIS — R918 Other nonspecific abnormal finding of lung field: Secondary | ICD-10-CM | POA: Insufficient documentation

## 2015-08-02 DIAGNOSIS — Z902 Acquired absence of lung [part of]: Secondary | ICD-10-CM | POA: Insufficient documentation

## 2015-08-02 LAB — CBC WITH DIFFERENTIAL/PLATELET
BASO%: 0.7 % (ref 0.0–2.0)
BASOS ABS: 0 10*3/uL (ref 0.0–0.1)
EOS ABS: 0 10*3/uL (ref 0.0–0.5)
EOS%: 0.7 % (ref 0.0–7.0)
HCT: 32.7 % — ABNORMAL LOW (ref 34.8–46.6)
HEMOGLOBIN: 10.9 g/dL — AB (ref 11.6–15.9)
LYMPH%: 19.1 % (ref 14.0–49.7)
MCH: 29.9 pg (ref 25.1–34.0)
MCHC: 33.4 g/dL (ref 31.5–36.0)
MCV: 89.4 fL (ref 79.5–101.0)
MONO#: 0.6 10*3/uL (ref 0.1–0.9)
MONO%: 12.3 % (ref 0.0–14.0)
NEUT#: 3.4 10*3/uL (ref 1.5–6.5)
NEUT%: 67.2 % (ref 38.4–76.8)
Platelets: 165 10*3/uL (ref 145–400)
RBC: 3.65 10*6/uL — AB (ref 3.70–5.45)
RDW: 13.9 % (ref 11.2–14.5)
WBC: 5 10*3/uL (ref 3.9–10.3)
lymph#: 1 10*3/uL (ref 0.9–3.3)

## 2015-08-02 LAB — COMPREHENSIVE METABOLIC PANEL
ALT: 14 U/L (ref 0–55)
ANION GAP: 5 meq/L (ref 3–11)
AST: 23 U/L (ref 5–34)
Albumin: 3.6 g/dL (ref 3.5–5.0)
Alkaline Phosphatase: 38 U/L — ABNORMAL LOW (ref 40–150)
BILIRUBIN TOTAL: 0.3 mg/dL (ref 0.20–1.20)
BUN: 23.6 mg/dL (ref 7.0–26.0)
CALCIUM: 9.1 mg/dL (ref 8.4–10.4)
CHLORIDE: 97 meq/L — AB (ref 98–109)
CO2: 29 meq/L (ref 22–29)
CREATININE: 1 mg/dL (ref 0.6–1.1)
EGFR: 52 mL/min/{1.73_m2} — ABNORMAL LOW (ref >90)
Glucose: 90 mg/dl (ref 70–140)
Potassium: 4.5 mEq/L (ref 3.5–5.1)
Sodium: 131 mEq/L — ABNORMAL LOW (ref 136–145)
TOTAL PROTEIN: 6.6 g/dL (ref 6.4–8.3)

## 2015-08-02 MED ORDER — IOPAMIDOL (ISOVUE-300) INJECTION 61%
75.0000 mL | Freq: Once | INTRAVENOUS | Status: AC | PRN
  Administered 2015-08-02: 75 mL via INTRAVENOUS

## 2015-08-03 ENCOUNTER — Telehealth: Payer: Self-pay | Admitting: Internal Medicine

## 2015-08-03 NOTE — Telephone Encounter (Signed)
returned call and lvm to call back to r/s

## 2015-08-09 ENCOUNTER — Encounter: Payer: Self-pay | Admitting: Internal Medicine

## 2015-08-09 ENCOUNTER — Ambulatory Visit (HOSPITAL_BASED_OUTPATIENT_CLINIC_OR_DEPARTMENT_OTHER): Payer: Medicare Other | Admitting: Internal Medicine

## 2015-08-09 VITALS — BP 140/78 | HR 80 | Temp 98.4°F | Resp 18 | Ht 62.0 in | Wt 101.8 lb

## 2015-08-09 DIAGNOSIS — Z85118 Personal history of other malignant neoplasm of bronchus and lung: Secondary | ICD-10-CM

## 2015-08-09 DIAGNOSIS — C3411 Malignant neoplasm of upper lobe, right bronchus or lung: Secondary | ICD-10-CM

## 2015-08-09 NOTE — Progress Notes (Signed)
[place] [phone]   [phone]  OFFICE PROGRESS NOTE  [name], MD [address] [place] [address] [place] [address]  DIAGNOSIS: Stage IIA (T2b, N0, M0) non-small cell lung cancer, adenocarcinoma with positive EGFR mutation in exon 21 ([id number]) presented with right upper lobe lung mass diagnosed in March of 2015.  PRIOR THERAPY: Status post right upper lobectomy with wedge resection of the right middle lobe under the care of Dr. Servando Snare on 06/07/2013  CURRENT THERAPY: Observation.  INTERVAL HISTORY: [name] 82 y.o. female returns to the clinic today for six-month followup visit. The patient has been on observation for the last 2 years. She is feeling fine today with no specific complaints except for mild cough. She denied having any significant weakness or fatigue. She has no nausea or vomiting. She has no fever or chills. She denied having any significant chest pain, shortness of breath, or hemoptysis. She had repeat CT scan of the chest performed recently and she is here for evaluation and discussion of her scan results.   MEDICAL HISTORY: Past Medical History  Diagnosis Date  . Iron deficiency anemia   . Sjogren's syndrome ([place])   . Abnormal ECG 11/2008    left bbb  . Raynaud's phenomenon 1965  . Arthritis   . Colon polyps   . Constipation     takes Colace daily  . GERD (gastroesophageal reflux disease)     takes Omeprazole daily  . Dry eyes     eye drops used   . Rheumatoid arthritis ([place])   . Back pain     scoliosis  . Osteoporosis   . Sciatica   . Lupus ([place])     takes Plaquenil daily  . PONV (postoperative nausea and vomiting)     likes phenergan  . Cancer ([place]) 2015    lung    ALLERGIES:  is allergic to prochlorperazine edisylate and aspirin.  MEDICATIONS:  Current Outpatient Prescriptions  Medication Sig Dispense Refill  . amLODipine (NORVASC) 5 MG tablet Take 1 tablet (5 mg total) by mouth daily. 30  tablet 6  . Biotin 1000 MCG tablet Take 1,000 mcg by mouth daily.     . Calcium Carbonate-Vitamin D (CALCIUM 500 + D) 500-125 MG-UNIT TABS Take 1 tablet by mouth daily.     . diclofenac sodium (VOLTAREN) 1 % GEL APPLY 2-4 GRAMS UP TO 4 TIMES A DAY AS NEEDED FOR PAIN  2  . docusate sodium (STOOL SOFTENER) 100 MG capsule Take 100 mg by mouth 2 (two) times daily.      Marland Kitchen ESTRACE VAGINAL 0.1 MG/GM vaginal cream USE AS DIRECTED 42.5 g 2  . gabapentin (NEURONTIN) 300 MG capsule Take 300 mg by mouth 2 (two) times daily.    . Glucosamine-Chondroit-Vit C-Mn (GLUCOSAMINE CHONDR 1500 COMPLX PO) Take 1 capsule by mouth daily.     Marland Kitchen HYDROcodone-acetaminophen (NORCO/VICODIN) 5-325 MG tablet Take 1 tablet by mouth every 12 (twelve) hours as needed. for pain  0  . meloxicam (MOBIC) 7.5 MG tablet Take 7.5 mg by mouth 2 (two) times daily as needed for pain.    . Multiple Vitamin (MULTIVITAMIN) tablet Take 1 tablet by mouth daily.      Marland Kitchen omeprazole (PRILOSEC) 20 MG capsule TAKE 1 CAPSULE (20 MG TOTAL) BY MOUTH 2 (TWO) TIMES DAILY BEFORE A MEAL. 60 capsule 6  . spironolactone (ALDACTONE) 25 MG tablet Take 12.5 mg by mouth daily.   2  No current facility-administered medications for this visit.    SURGICAL HISTORY:  Past Surgical History  Procedure Laterality Date  . Appendectomy    . Tonsillectomy    . Total abdominal hysterectomy  1980's   . Thoracic sympathetectomy  1965  . Cataract extraction Bilateral   . Colonoscopy    . Cardiovascular stress test      12/2008 mild fixed basal to mid septal perfusion defect felt likely due to artifact from LBBB, no ischemia, EF 58%  . Video bronchoscopy with endobronchial navigation N/A 05/04/2013    Procedure: VIDEO BRONCHOSCOPY WITH ENDOBRONCHIAL NAVIGATION;  Surgeon: [name], MD;  Location: [facility];  Service: Thoracic;  Laterality: N/A;  . Video bronchoscopy N/A 06/07/2013    Procedure: VIDEO BRONCHOSCOPY;  Surgeon: [name], MD;  Location: [facility] OR;   Service: Thoracic;  Laterality: N/A;  . Video assisted thoracoscopy (vats)/wedge resection Right 06/07/2013    Procedure: VIDEO ASSISTED THORACOSCOPY (VATS)/right upper lobectomy, On Q;  Surgeon: [name], MD;  Location: [facility];  Service: Thoracic;  Laterality: Right;  . Thoracotomy  06/07/2013    Procedure: MINI/LIMITED THORACOTOMY; right middle lobe wedge resection;  Surgeon: [name], MD;  Location: [facility] OR;  Service: Thoracic;;  . Lymph node dissection Right 06/07/2013    Procedure: LYMPH NODE DISSECTION;  Surgeon: [name], MD;  Location: [place];  Service: Thoracic;  Laterality: Right;  . Total hip arthroplasty Right 04/28/2014    Procedure: RIGHT TOTAL HIP ARTHROPLASTY ANTERIOR APPROACH;  Surgeon: [name], MD;  Location: [facility];  Service: Orthopedics;  Laterality: Right;    REVIEW OF SYSTEMS:  A comprehensive review of systems was negative except for: Respiratory: positive for cough   PHYSICAL EXAMINATION: General appearance: alert, cooperative and no distress Head: Normocephalic, without obvious abnormality, atraumatic Neck: no adenopathy, no JVD, supple, symmetrical, trachea midline and thyroid not enlarged, symmetric, no tenderness/mass/nodules Lymph nodes: Cervical, supraclavicular, and axillary nodes normal. Resp: clear to auscultation bilaterally Back: symmetric, no curvature. ROM normal. No CVA tenderness. Cardio: regular rate and rhythm, S1, S2 normal, no murmur, click, rub or gallop GI: soft, non-tender; bowel sounds normal; no masses,  no organomegaly Extremities: extremities normal, atraumatic, no cyanosis or edema Neurologic: Alert and oriented X 3, normal strength and tone. Normal symmetric reflexes. Normal coordination and gait  ECOG PERFORMANCE STATUS: 1 - Symptomatic but completely ambulatory  Blood pressure 140/78, pulse 80, temperature 98.4 F (36.9 C), temperature source Oral, resp. rate 18, height _0  (1.575 m), weight 101 lb  12.8 oz (46.176 kg), SpO2 100 %.  LABORATORY DATA: Lab Results  Component Value Date   WBC 5.0 08/02/2015   HGB 10.9* 08/02/2015   HCT 32.7* 08/02/2015   MCV 89.4 08/02/2015   PLT 165 08/02/2015      Chemistry      Component Value Date/Time   NA 131* 08/02/2015 1037   NA 130* 05/22/2015 1043   K 4.5 08/02/2015 1037   K 4.4 05/22/2015 1043   CL 95* 05/22/2015 1043   CO2 29 08/02/2015 1037   CO2 31 05/22/2015 1043   BUN 23.6 08/02/2015 1037   BUN 27* 05/22/2015 1043   CREATININE 1.0 08/02/2015 1037   CREATININE 0.96 05/22/2015 1043   CREATININE 1.15* 11/11/2013 1557      Component Value Date/Time   CALCIUM 9.1 08/02/2015 1037   CALCIUM 9.8 05/22/2015 1043   ALKPHOS 38* 08/02/2015 1037   ALKPHOS 44 05/22/2015 1043   AST  23 08/02/2015 1037   AST 26 05/22/2015 1043   ALT 14 08/02/2015 1037   ALT 12 05/22/2015 1043   BILITOT 0.30 08/02/2015 1037   BILITOT 0.5 05/22/2015 1043       RADIOGRAPHIC STUDIES: Ct Chest W Contrast  08/02/2015  CLINICAL DATA:  82 year old female with history of lung cancer diagnosed in April 2015 status post right upper lobectomy and right middle lobe wedge resection on 06/08/2013. Followup study. EXAM: CT CHEST WITH CONTRAST TECHNIQUE: Multidetector CT imaging of the chest was performed during intravenous contrast administration. CONTRAST:  24m ISOVUE-300 IOPAMIDOL (ISOVUE-300) INJECTION 61% COMPARISON:  Chest CT 02/01/2015. FINDINGS: Mediastinum/Lymph Nodes: Heart size is normal. There is no significant pericardial fluid, thickening or pericardial calcification. Atherosclerosis in the thoracic aorta. No pathologically enlarged mediastinal or hilar lymph nodes. Esophagus is unremarkable in appearance. No axillary lymphadenopathy. Lungs/Pleura: Postoperative changes of right upper lobectomy and right middle lobe wedge resection are again noted. There is no soft tissue mass adjacent to the right hilum or the suture line in the right middle lobe to  suggest local recurrence of disease. Areas of architectural distortion are noted in the periphery of the right upper lobe, particularly near the apex, most compatible with mild postoperative scarring. In addition, there are areas of peripheral predominant septal thickening, scattered areas of mild cylindrical bronchiectasis and peripheral bronchiolectasis, as well as some peribronchovascular micronodularity, most evident in the posterior aspect of the right lower lobe, similar to the prior study, predominantly favored to reflect areas of mucoid impaction within terminal bronchioles, likely secondary to post infectious scarring. 3 mm ground-glass attenuation nodule in the left upper lobe (image 29 of series 6) is unchanged. No other suspicious appearing pulmonary nodules or masses are noted. There is no acute consolidative airspace disease. No pleural effusions. Upper Abdomen: Unremarkable. Musculoskeletal/Soft Tissues: There are no aggressive appearing lytic or blastic lesions noted in the visualized portions of the skeleton. IMPRESSION: 1. Status post right upper lobectomy and right middle lobe wedge resection. No finding local recurrence of disease or metastatic disease in the thorax. 2. Areas of chronic post infectious or inflammatory scarring, predominantly in the dependent portions of the right lower lobe where there are scattered areas of mucoid impaction within terminal bronchioles. 3. Additional incidental findings, as above. Electronically Signed   By: DVinnie LangtonM.D.   On: 08/02/2015 13:47   ASSESSMENT AND PLAN: This is a very pleasant 82years old white female recently diagnosed with a stage IIA non-small cell lung cancer, adenocarcinoma with positive EGFR mutation in exon 21. She declined adjuvant chemotherapy. The recent CT scan of the chest showed no evidence for disease recurrence but some areas of chronic post infectious or inflammatory scarring in the right lower lobe. I discussed the scan  results with the patient today. I recommended for her to continue on observation with repeat CT scan of the chest in 6 months. She was advised to call immediately if she has any concerning symptoms in the interval. The patient voices understanding of current disease status and treatment options and is in agreement with the current care plan.  All questions were answered. The patient knows to call the clinic with any problems, questions or concerns. We can certainly see the patient much sooner if necessary.  Disclaimer: This note was dictated with voice recognition software. Similar sounding words can inadvertently be transcribed and may not be corrected upon review.

## 2015-08-10 ENCOUNTER — Encounter: Payer: Self-pay | Admitting: Family Medicine

## 2015-08-30 ENCOUNTER — Ambulatory Visit (INDEPENDENT_AMBULATORY_CARE_PROVIDER_SITE_OTHER): Payer: Medicare Other | Admitting: Cardiothoracic Surgery

## 2015-08-30 ENCOUNTER — Encounter: Payer: Self-pay | Admitting: Cardiothoracic Surgery

## 2015-08-30 VITALS — BP 130/73 | HR 80 | Temp 97.9°F | Resp 20 | Ht 62.0 in | Wt 101.0 lb

## 2015-08-30 DIAGNOSIS — Z902 Acquired absence of lung [part of]: Secondary | ICD-10-CM | POA: Diagnosis not present

## 2015-08-30 DIAGNOSIS — C3411 Malignant neoplasm of upper lobe, right bronchus or lung: Secondary | ICD-10-CM

## 2015-08-30 NOTE — Progress Notes (Signed)
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  POSTOPERATIVE DIAGNOSIS: Right upper lobe adenocarcinoma of the lung.  SURGICAL PROCEDURES: Video bronchoscopy, right video-assisted  thoracoscopy, mini-thoracotomy, right upper lobectomy with lymph node  dissection, wedge resection of right middle lobe, placement of On-Q.  SURGEON: Lanelle Bal, MD.  Lung cancer, Right upper lobe   Primary site: Lung (Right)   Staging method: AJCC 7th Edition   Clinical: Stage IIA (T2b, N0, M0) signed by [name], MD on 05/08/2013 10:00 PM   Pathologic: Stage IIA (T2b, N0, cM0) signed by [name], MD on 06/13/2013 10:49 AM   Summary: Stage IIA (T2b, N0, cM0)  Stage IIA (T2b, N0, M0) non-small cell lung cancer, adenocarcinoma with positive EGFR mutation in exon 21 ([id number]) presented with right upper lobe lung mass   History of Present Illness:     The patient is an 82 year old female who  presents for routine follow up s/p right mini thoracotomy and right upper lobectomy and wedge resection of the right middle lobe on 06/07/2013.Marland Kitchen Her rheumatoid arthritis is limiting her overall activity.  She continues to have chronic back pain   Patient notes that 6 weeks ago she started on amlodipine, she has noted increased lower extremity edema with this. She's now developed a red rash for several weeks over both lower extremities and ankles with edema. She denies any fever or chills. She stopped the amlodipine 3 days ago and notes some improvement. She has appointment with primary care tomorrow to evaluate her lower extremities   Zubrod Score: At the time of surgery this patient's most appropriate  activity status/level should be described as: _0     0    Normal activity, no symptoms _1     1    Restricted in physical strenuous activity but ambulatory, able to do out light work _2     2    Ambulatory and capable of self care, unable to do work activities, up and about                 >50 % of waking hours                                                                                   _3     3    Only limited self care, in bed greater than 50% of waking hours _4     4    Completely disabled, no self care, confined to bed or chair _5     5    Moribund  History  Smoking status  . Never Smoker   Smokeless tobacco  . Never Used       Allergies  Allergen Reactions  . Prochlorperazine Edisylate Anaphylaxis    Compazine  . Aspirin     REACTION: nose bleeds    Current Outpatient Prescriptions  Medication  Sig Dispense Refill  . Biotin 1000 MCG tablet Take 1,000 mcg by mouth daily.     . Calcium Carbonate-Vitamin D (CALCIUM 500 + D) 500-125 MG-UNIT TABS Take 1 tablet by mouth daily.     . diclofenac sodium (VOLTAREN) 1 % GEL APPLY 2-4 GRAMS UP TO 4 TIMES A DAY AS NEEDED FOR PAIN  2  . docusate sodium (STOOL SOFTENER) 100 MG capsule Take 100 mg by mouth 2 (two) times daily.      Marland Kitchen ESTRACE VAGINAL 0.1 MG/GM vaginal cream USE AS DIRECTED 42.5 g 2  . gabapentin (NEURONTIN) 300 MG capsule Take 300 mg by mouth 2 (two) times daily.    . Glucosamine-Chondroit-Vit C-Mn (GLUCOSAMINE CHONDR 1500 COMPLX PO) Take 1 capsule by mouth daily.     Marland Kitchen HYDROcodone-acetaminophen (NORCO/VICODIN) 5-325 MG tablet Take 1 tablet by mouth every 12 (twelve) hours as needed. for pain  0  . meloxicam (MOBIC) 7.5 MG tablet Take 7.5 mg by mouth 2 (two) times daily as needed for pain.    . Multiple Vitamin (MULTIVITAMIN) tablet Take 1 tablet by mouth daily.      Marland Kitchen omeprazole (PRILOSEC) 20 MG capsule TAKE 1 CAPSULE (20 MG TOTAL) BY MOUTH 2 (TWO) TIMES DAILY BEFORE A MEAL. 60 capsule 6  . spironolactone (ALDACTONE) 25 MG  tablet Take 12.5 mg by mouth daily.   2  . amLODipine (NORVASC) 5 MG tablet Take 1 tablet (5 mg total) by mouth daily. (Patient not taking: Reported on 08/30/2015) 30 tablet 6   No current facility-administered medications for this visit.     Physical Exam: BP 130/73 mmHg  Pulse 80  Temp(Src) 97.9 F (36.6 C) (Oral)  Resp 20  Ht _0  (1.575 m)  Wt 101 lb (45.813 kg)  BMI 18.47 kg/m2  SpO2 98%  General appearance: alert, cooperative and no distress Heart: regular rate and rhythm Lungs: left lung is clear; diminished at right apex but right base is clear Abdomen: Soft, minimal tenderness with palpation to right upper quadrant, minor distention, bowel sounds present Extremities: no edema Wound: Completely healed Both ankles and lower extremities have a red rash with petechia and mild edema, she notes that it has improved some since she stopped taking amlodipine 3 days ago        Diagnostic Studies & Laboratory data:         Recent Radiology Findings:  Ct Chest W Contrast  08/02/2015  CLINICAL DATA:  82 year old female with history of lung cancer diagnosed in April 2015 status post right upper lobectomy and right middle lobe wedge resection on 06/08/2013. Followup study. EXAM: CT CHEST WITH CONTRAST TECHNIQUE: Multidetector CT imaging of the chest was performed during intravenous contrast administration. CONTRAST:  52m ISOVUE-300 IOPAMIDOL (ISOVUE-300) INJECTION 61% COMPARISON:  Chest CT 02/01/2015. FINDINGS: Mediastinum/Lymph Nodes: Heart size is normal. There is no significant pericardial fluid, thickening or pericardial calcification. Atherosclerosis in the thoracic aorta. No pathologically enlarged mediastinal or hilar lymph nodes. Esophagus is unremarkable in appearance. No axillary lymphadenopathy. Lungs/Pleura: Postoperative changes of right upper lobectomy and right middle lobe wedge resection are again noted. There is no soft tissue mass adjacent to the right hilum or the  suture line in the right middle lobe to suggest local recurrence of disease. Areas of architectural distortion are noted in the periphery of the right upper lobe, particularly near the apex, most compatible with mild postoperative scarring. In addition, there are areas of peripheral predominant septal thickening, scattered areas of mild cylindrical bronchiectasis  and peripheral bronchiolectasis, as well as some peribronchovascular micronodularity, most evident in the posterior aspect of the right lower lobe, similar to the prior study, predominantly favored to reflect areas of mucoid impaction within terminal bronchioles, likely secondary to post infectious scarring. 3 mm ground-glass attenuation nodule in the left upper lobe (image 29 of series 6) is unchanged. No other suspicious appearing pulmonary nodules or masses are noted. There is no acute consolidative airspace disease. No pleural effusions. Upper Abdomen: Unremarkable. Musculoskeletal/Soft Tissues: There are no aggressive appearing lytic or blastic lesions noted in the visualized portions of the skeleton. IMPRESSION: 1. Status post right upper lobectomy and right middle lobe wedge resection. No finding local recurrence of disease or metastatic disease in the thorax. 2. Areas of chronic post infectious or inflammatory scarring, predominantly in the dependent portions of the right lower lobe where there are scattered areas of mucoid impaction within terminal bronchioles. 3. Additional incidental findings, as above. Electronically Signed   By: Vinnie Langton M.D.   On: 08/02/2015 13:47   I have independently reviewed the above radiology studies  and reviewed the findings with the patient.   Recent Labs: Lab Results  Component Value Date   WBC 5.0 08/02/2015   HGB 10.9* 08/02/2015   HCT 32.7* 08/02/2015   PLT 165 08/02/2015   GLUCOSE 90 08/02/2015   CHOL 208* 05/22/2015   TRIG 98.0 05/22/2015   HDL 68.10 05/22/2015   LDLDIRECT 100.9  08/13/2011   LDLCALC 120* 05/22/2015   ALT 14 08/02/2015   AST 23 08/02/2015   NA 131* 08/02/2015   K 4.5 08/02/2015   CL 95* 05/22/2015   CREATININE 1.0 08/02/2015   BUN 23.6 08/02/2015   CO2 29 08/02/2015   TSH 4.33 05/22/2015   INR 0.97 04/21/2014      Assessment / Plan:   Stable following right lung resection without evidence of recurrence by CT under observational treatment currently. Now 2 years post op Plan to see her back in 12 months, CT scan in 6 months by Oncology  To see primary Care tomorrow about legs rash/cellutlitis    [name] MD      [address] [place] [address] Office [phone]   Beeper [phone]

## 2015-08-31 ENCOUNTER — Encounter: Payer: Self-pay | Admitting: Family Medicine

## 2015-08-31 ENCOUNTER — Ambulatory Visit (INDEPENDENT_AMBULATORY_CARE_PROVIDER_SITE_OTHER): Payer: Medicare Other | Admitting: Family Medicine

## 2015-08-31 VITALS — BP 132/64 | HR 87 | Temp 98.2°F | Resp 17 | Ht 62.0 in | Wt 103.2 lb

## 2015-08-31 DIAGNOSIS — L03019 Cellulitis of unspecified finger: Secondary | ICD-10-CM

## 2015-08-31 DIAGNOSIS — L03115 Cellulitis of right lower limb: Secondary | ICD-10-CM

## 2015-08-31 MED ORDER — AMOXICILLIN-POT CLAVULANATE 875-125 MG PO TABS: 1.0000 | ORAL_TABLET | Freq: Two times a day (BID) | ORAL | Status: DC

## 2015-08-31 NOTE — Patient Instructions (Signed)
Follow up as needed Start the Augmentin twice daily (take w/ food) You are scheduled w/ Dr Caralyn Guile at [facility] at 2:00pm today!!!! Call with any questions or concerns Hang in there!!!

## 2015-08-31 NOTE — Progress Notes (Signed)
Pre visit review using our clinic review tool, if applicable. No additional management support is needed unless otherwise documented below in the visit note. 

## 2015-08-31 NOTE — Progress Notes (Signed)
   Subjective:    Patient ID: [name], female    DOB: 05/07/1930, 82 y.o.   MRN: [id number]  HPI Leg swelling and redness- bilateral, pt reports sxs started when she started Amlodipine.  Pt reports sxs have improved somewhat each day since stopping the medication 4 days ago.  Legs are red, warm to touch and swollen.  TTP.  Paronychia- pt reports she developed infxn of L 4th finger and R 1st finger ~2 weeks ago after visiting nail salon.  She has had swelling and drainage, redness, and pain.  Area is very TTP.  There appears to be visible pus pocket under R 1st finger.   Review of Systems For ROS see HPI     Objective:   Physical Exam  Constitutional: She appears well-developed and well-nourished. No distress.  HENT:  Head: Normocephalic and atraumatic.  Cardiovascular: Intact distal pulses.   Musculoskeletal: She exhibits edema (bilateral 1+ LE edema w/ TTP ) and tenderness.  Skin: Skin is warm and dry. There is erythema (erythema and warmth of lower legs bilaterally consistent w/ cellulitis).  R 1st finger and L 4th finger w/ bleeding, oozing and granulation tissue along nail border w/ visible pus pocket under R 1st finger nail and w/ induration and apparent pus pocket medial to L nail  Vitals reviewed.         Assessment & Plan:  Cellulitis- pt w/ apparent LE cellulitis likely due to swelling caused by Amlodipine.  Pt has already stopped Amlodipine and sxs are starting to improve but due to redness and warmth of legs bilaterally will tx for superimposed bacterial infxn.  Start Augmentin.  Reviewed supportive care and red flags that should prompt return.  Pt expressed understanding and is in agreement w/ plan.   Paronychia- new.  Pt w/ 2 obviously infected fingers after going to nail salon.  Both may require I&D which we are not equipped for in this office.  Called 2 hand offices and pt is able to see Dr Caralyn Guile this afternoon for evaluation and tx.  Pt expressed  understanding and is in agreement w/ plan.

## 2015-09-04 ENCOUNTER — Encounter: Payer: Self-pay | Admitting: Family Medicine

## 2015-09-04 MED ORDER — FUROSEMIDE 20 MG PO TABS: 20.0000 mg | ORAL_TABLET | Freq: Every day | ORAL | Status: DC

## 2015-09-07 ENCOUNTER — Encounter: Payer: Self-pay | Admitting: Family Medicine

## 2015-09-10 ENCOUNTER — Ambulatory Visit (INDEPENDENT_AMBULATORY_CARE_PROVIDER_SITE_OTHER): Payer: Medicare Other | Admitting: Family Medicine

## 2015-09-10 ENCOUNTER — Encounter: Payer: Self-pay | Admitting: Family Medicine

## 2015-09-10 ENCOUNTER — Other Ambulatory Visit (INDEPENDENT_AMBULATORY_CARE_PROVIDER_SITE_OTHER): Payer: Medicare Other

## 2015-09-10 VITALS — BP 130/62 | HR 72 | Temp 98.1°F | Resp 16 | Ht 62.0 in | Wt 103.0 lb

## 2015-09-10 DIAGNOSIS — R600 Localized edema: Secondary | ICD-10-CM

## 2015-09-10 DIAGNOSIS — L03115 Cellulitis of right lower limb: Secondary | ICD-10-CM | POA: Diagnosis not present

## 2015-09-10 LAB — CBC WITH DIFFERENTIAL/PLATELET
BASOS PCT: 0.5 % (ref 0.0–3.0)
Basophils Absolute: 0 10*3/uL (ref 0.0–0.1)
EOS PCT: 1 % (ref 0.0–5.0)
Eosinophils Absolute: 0.1 10*3/uL (ref 0.0–0.7)
HCT: 30.9 % — ABNORMAL LOW (ref 36.0–46.0)
HEMOGLOBIN: 10.4 g/dL — AB (ref 12.0–15.0)
LYMPHS PCT: 27.2 % (ref 12.0–46.0)
Lymphs Abs: 1.4 10*3/uL (ref 0.7–4.0)
MCHC: 33.7 g/dL (ref 30.0–36.0)
MCV: 88.3 fl (ref 78.0–100.0)
MONO ABS: 0.5 10*3/uL (ref 0.1–1.0)
Monocytes Relative: 10 % (ref 3.0–12.0)
NEUTROS ABS: 3.2 10*3/uL (ref 1.4–7.7)
Neutrophils Relative %: 61.3 % (ref 43.0–77.0)
Platelets: 186 10*3/uL (ref 150.0–400.0)
RBC: 3.5 Mil/uL — AB (ref 3.87–5.11)
RDW: 13.3 % (ref 11.5–15.5)
WBC: 5.3 10*3/uL (ref 4.0–10.5)

## 2015-09-10 LAB — BASIC METABOLIC PANEL
BUN: 25 mg/dL — ABNORMAL HIGH (ref 6–23)
CALCIUM: 9.3 mg/dL (ref 8.4–10.5)
CHLORIDE: 94 meq/L — AB (ref 96–112)
CO2: 32 meq/L (ref 19–32)
Creatinine, Ser: 1.08 mg/dL (ref 0.40–1.20)
GFR: 51.52 mL/min — ABNORMAL LOW (ref >60.00)
GLUCOSE: 116 mg/dL — AB (ref 70–99)
Potassium: 4.2 mEq/L (ref 3.5–5.1)
SODIUM: 130 meq/L — AB (ref 135–145)

## 2015-09-10 MED ORDER — CEPHALEXIN 500 MG PO CAPS: 500.0000 mg | ORAL_CAPSULE | Freq: Two times a day (BID) | ORAL | 0 refills | Status: DC

## 2015-09-10 NOTE — Progress Notes (Signed)
   Subjective:    Patient ID: [name], female    DOB: Feb 06, 1931, 82 y.o.   MRN: [id number]  HPI Leg swelling/redness- pt reports legs are no longer swollen since starting Lasix but they continue to be red and 'they feel like they're on fire'.  Pt reports sxs are worse in the sun.  Legs are very TTP.  Continues to take Augmentin twice daily.     Review of Systems For ROS see HPI     Objective:   Physical Exam  Constitutional: She is oriented to person, place, and time. She appears well-developed and well-nourished. No distress.  HENT:  Head: Normocephalic and atraumatic.  Cardiovascular: Intact distal pulses.   Musculoskeletal: She exhibits edema (trace LE edema on R, no swelling on L).  Neurological: She is alert and oriented to person, place, and time.  Skin: Skin is warm and dry. There is erythema.  Psychiatric: She has a normal mood and affect. Her behavior is normal. Thought content normal.  Vitals reviewed.         Assessment & Plan:

## 2015-09-10 NOTE — Patient Instructions (Signed)
Go to the [facility] office and get your blood drawn We'll notify you of your lab results and make any changes if needed Finish the Amoxicillin-Clavulanic Acid as directed and then start the Cephalexin (Keflex) twice daily Drink plenty of fluids Apply cool compresses to the lower legs Call with any questions or concerns Hang in there!!!

## 2015-09-10 NOTE — Progress Notes (Signed)
Pre visit review using our clinic review tool, if applicable. No additional management support is needed unless otherwise documented below in the visit note. 

## 2015-09-10 NOTE — Assessment & Plan Note (Signed)
Pt's swelling is much improved since starting Lasix.  Redness is much less pronounced but she does have continued TTP and warmth to R lower leg.  She is completing her course of Augmentin.  I am not completely convinced this is cellulitis but since she is immunosuppressed, I will switch to Keflex for 7 days.  Reviewed supportive care and red flags that should prompt return.  Pt expressed understanding and is in agreement w/ plan.

## 2015-09-21 ENCOUNTER — Encounter: Payer: Self-pay | Admitting: Family Medicine

## 2015-09-24 ENCOUNTER — Other Ambulatory Visit: Payer: Self-pay | Admitting: Family Medicine

## 2015-09-24 DIAGNOSIS — G609 Hereditary and idiopathic neuropathy, unspecified: Secondary | ICD-10-CM

## 2015-09-24 NOTE — Progress Notes (Signed)
Referral placed, pt informed on mychart.

## 2015-10-11 ENCOUNTER — Ambulatory Visit (INDEPENDENT_AMBULATORY_CARE_PROVIDER_SITE_OTHER): Payer: Medicare Other | Admitting: Family Medicine

## 2015-10-11 ENCOUNTER — Encounter: Payer: Self-pay | Admitting: Family Medicine

## 2015-10-11 VITALS — BP 126/64 | HR 79 | Temp 98.0°F | Resp 17 | Ht 62.0 in | Wt 104.2 lb

## 2015-10-11 DIAGNOSIS — E7800 Pure hypercholesterolemia, unspecified: Secondary | ICD-10-CM | POA: Diagnosis not present

## 2015-10-11 DIAGNOSIS — IMO0001 Reserved for inherently not codable concepts without codable children: Secondary | ICD-10-CM

## 2015-10-11 DIAGNOSIS — R030 Elevated blood-pressure reading, without diagnosis of hypertension: Secondary | ICD-10-CM

## 2015-10-11 DIAGNOSIS — R600 Localized edema: Secondary | ICD-10-CM | POA: Diagnosis not present

## 2015-10-11 LAB — CBC WITH DIFFERENTIAL/PLATELET
BASOS ABS: 0 10*3/uL (ref 0.0–0.1)
Basophils Relative: 0.5 % (ref 0.0–3.0)
Eosinophils Absolute: 0 10*3/uL (ref 0.0–0.7)
Eosinophils Relative: 0.6 % (ref 0.0–5.0)
HEMATOCRIT: 31.9 % — AB (ref 36.0–46.0)
HEMOGLOBIN: 10.7 g/dL — AB (ref 12.0–15.0)
LYMPHS PCT: 20.3 % (ref 12.0–46.0)
Lymphs Abs: 1.3 10*3/uL (ref 0.7–4.0)
MCHC: 33.5 g/dL (ref 30.0–36.0)
MCV: 89.2 fl (ref 78.0–100.0)
MONOS PCT: 8.2 % (ref 3.0–12.0)
Monocytes Absolute: 0.5 10*3/uL (ref 0.1–1.0)
NEUTROS ABS: 4.7 10*3/uL (ref 1.4–7.7)
Neutrophils Relative %: 70.4 % (ref 43.0–77.0)
Platelets: 174 10*3/uL (ref 150.0–400.0)
RBC: 3.57 Mil/uL — AB (ref 3.87–5.11)
RDW: 13 % (ref 11.5–15.5)
WBC: 6.6 10*3/uL (ref 4.0–10.5)

## 2015-10-11 LAB — LIPID PANEL
CHOL/HDL RATIO: 3
CHOLESTEROL: 209 mg/dL — AB (ref 0–200)
HDL: 67.5 mg/dL (ref >39.00)
LDL CALC: 113 mg/dL — AB (ref 0–99)
NONHDL: 141.1
Triglycerides: 139 mg/dL (ref 0.0–149.0)
VLDL: 27.8 mg/dL (ref 0.0–40.0)

## 2015-10-11 LAB — HEPATIC FUNCTION PANEL
ALBUMIN: 4.2 g/dL (ref 3.5–5.2)
ALT: 12 U/L (ref 0–35)
AST: 24 U/L (ref 0–37)
Alkaline Phosphatase: 42 U/L (ref 39–117)
BILIRUBIN DIRECT: 0 mg/dL (ref 0.0–0.3)
BILIRUBIN TOTAL: 0.3 mg/dL (ref 0.2–1.2)
TOTAL PROTEIN: 6.9 g/dL (ref 6.0–8.3)

## 2015-10-11 LAB — BASIC METABOLIC PANEL
BUN: 29 mg/dL — AB (ref 6–23)
CHLORIDE: 98 meq/L (ref 96–112)
CO2: 31 meq/L (ref 19–32)
CREATININE: 1.14 mg/dL (ref 0.40–1.20)
Calcium: 9.1 mg/dL (ref 8.4–10.5)
GFR: 48.39 mL/min — ABNORMAL LOW (ref >60.00)
GLUCOSE: 84 mg/dL (ref 70–99)
Potassium: 4.2 mEq/L (ref 3.5–5.1)
Sodium: 134 mEq/L — ABNORMAL LOW (ref 135–145)

## 2015-10-11 NOTE — Assessment & Plan Note (Signed)
Pt's BP is normal today while on Lasix and Spironolactone.  Asymptomatic.  No changes at this time.

## 2015-10-11 NOTE — Progress Notes (Signed)
Pre visit review using our clinic review tool, if applicable. No additional management support is needed unless otherwise documented below in the visit note. 

## 2015-10-11 NOTE — Progress Notes (Signed)
   Subjective:    Patient ID: [name], female    DOB: Jun 22, 1930, 82 y.o.   MRN: [id number]  HPI Elevated BP- on Lasix and Spironolactone daily for other reasons (leg swelling) and BP is adequately controlled today.  Denies CP, SOB, HAs, visual changes, edema.  Hyperlipidemia- last visit LDL was elevated at 120.  Not on medication.  Leg swelling- recurring problem for pt.  On Lasix and Spironolactone w/ good relief of swelling and improved leg pain.  Review of Systems For ROS see HPI     Objective:   Physical Exam  Constitutional: She is oriented to person, place, and time. She appears well-developed and well-nourished. No distress.  HENT:  Head: Normocephalic and atraumatic.  Eyes: Conjunctivae and EOM are normal. Pupils are equal, round, and reactive to light.  Neck: Normal range of motion. Neck supple. No thyromegaly present.  Cardiovascular: Normal rate, regular rhythm, normal heart sounds and intact distal pulses.   No murmur heard. Pulmonary/Chest: Effort normal and breath sounds normal. No respiratory distress.  Abdominal: Soft. She exhibits no distension. There is no tenderness.  Musculoskeletal: She exhibits no edema or tenderness.  Lymphadenopathy:    She has no cervical adenopathy.  Neurological: She is alert and oriented to person, place, and time.  Skin: Skin is warm and dry. There is erythema (mild erythema of lower legs bilaterally).  Psychiatric: She has a normal mood and affect. Her behavior is normal.  Vitals reviewed.         Assessment & Plan:

## 2015-10-11 NOTE — Assessment & Plan Note (Signed)
Improved since starting Lasix.  Only minimal redness today.  No need for abx.  Check labs but anticipate continued diuretics.  Pt expressed understanding and is in agreement w/ plan.

## 2015-10-11 NOTE — Assessment & Plan Note (Signed)
New.  Noted at last visit.  Pt is not on statin and is likely not a candidate due to age.  Pt has healthy diet and is very active although no formal exercise.  Check labs.  Adjust plan prn.

## 2015-10-11 NOTE — Patient Instructions (Signed)
Schedule your complete physical in 6 months We'll notify you of your lab results and make any changes if needed Keep up the good work!  You look great!!! Call with any questions or concerns Happy Labor Day!!!

## 2015-10-18 ENCOUNTER — Other Ambulatory Visit (INDEPENDENT_AMBULATORY_CARE_PROVIDER_SITE_OTHER): Payer: Medicare Other

## 2015-10-18 ENCOUNTER — Encounter: Payer: Self-pay | Admitting: Neurology

## 2015-10-18 ENCOUNTER — Ambulatory Visit (INDEPENDENT_AMBULATORY_CARE_PROVIDER_SITE_OTHER): Payer: Medicare Other | Admitting: Neurology

## 2015-10-18 VITALS — BP 120/70 | HR 99 | Ht 62.0 in | Wt 105.2 lb

## 2015-10-18 DIAGNOSIS — I7381 Erythromelalgia: Secondary | ICD-10-CM

## 2015-10-18 DIAGNOSIS — G609 Hereditary and idiopathic neuropathy, unspecified: Secondary | ICD-10-CM

## 2015-10-18 LAB — C-REACTIVE PROTEIN: CRP: <0.1 mg/dL — ABNORMAL LOW (ref 0.5–20.0)

## 2015-10-18 LAB — SEDIMENTATION RATE: Sed Rate: 12 mm/hr (ref 0–30)

## 2015-10-18 MED ORDER — GABAPENTIN 300 MG PO CAPS: 300.0000 mg | ORAL_CAPSULE | Freq: Three times a day (TID) | ORAL | 5 refills | Status: DC

## 2015-10-18 NOTE — Patient Instructions (Addendum)
1.  Check blood work 2.  NCS/EMG of the lower extremities 3.  Recommend follow-up with Dr. 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  Increase gabapentin to '300mg'$  at bedtime  Return to clinic 3 months

## 2015-10-18 NOTE — Progress Notes (Signed)
[facility] Clinic Note - Initial Visit   Date: 10/18/15  [name] MRN: [id number] DOB: 07/14/1930   Dear Dr. Birdie Riddle:  Thank you for your kind referral of [name] for consultation of bilateral feet pain. Although her history is well known to you, please allow [place] to reiterate it for the purpose of our medical record. The patient was accompanied to the clinic by self.    History of Present Illness: [name] is a 82 y.o. right-handed Caucasian female with rheumatoid arthritis, GERD, lung cancer s/p resection (2015) presenting for evaluation of burning pain of the feet.    For the past several years, she has developed burning sensation of the lower legs and feet and redness. There is increased warmth of the feet.  She recalls having severe pain with wearing socks and anything that touches her feet. It is much worse with laying supine.  She occasionally also has burning pain, numbness, and tingling involving the hands. In July 2017, she began having increased swelling and was treated for possible cellulitis.  She was previously on plaquenil due to early signs of macular degeneration.  She did not tolerate methotrexate.  Her initial autoimmune manifestations was when she was in college and developed severe discoloration of the hands, ultimately diagnosed with Raynaud's disease.  Because of the severity of her disease, she underwent thoracic sympathectomy and developed iatrogenic left Horner's syndrome.    She was seeing Dr. Baltazar Najjar at [facility] who diagnosed her with neuropathy and started her on gabapentin 331m twice daily.  Out-side paper records, electronic medical record, and images have been reviewed where available and summarized as:  Labs 2016:  SPEP with IFE neg, ANA positive, SSA/B neg, CCP neg  MRI brain wwo contrast 08/02/2015: No acute or metastatic intracranial abnormality.  Normal for age MRI appearance of the  brain.  Past Medical History:  Diagnosis Date  . Abnormal ECG 11/2008   left bbb  . Arthritis   . Back pain    scoliosis  . Cancer ([place]   lung  . Colon polyps   . Constipation    takes Colace daily  . Dry eyes    eye drops used   . GERD (gastroesophageal reflux disease)    takes Omeprazole daily  . Iron deficiency anemia   . Lupus ([place]    takes Plaquenil daily  . Osteoporosis   . PONV (postoperative nausea and vomiting)    likes phenergan  . Raynaud's phenomenon 1965  . Rheumatoid arthritis ([place]   . Sciatica   . Sjogren's syndrome ([facility]     Past Surgical History:  Procedure Laterality Date  . APPENDECTOMY    . CARDIOVASCULAR STRESS TEST     12/2008 mild fixed basal to mid septal perfusion defect felt likely due to artifact from LBBB, no ischemia, EF 58%  . CATARACT EXTRACTION Bilateral   . COLONOSCOPY    . LYMPH NODE DISSECTION Right 06/07/2013   Procedure: LYMPH NODE DISSECTION;  Surgeon: EGrace Isaac MD;  [facility]  Service: Thoracic;  Laterality: Right;  . [place] . THORACOTOMY  06/07/2013   Procedure: MINI/LIMITED THORACOTOMY; right middle lobe wedge resection;  Surgeon: EGrace Isaac MD;  [place] [facility]  Service: Thoracic;;  . TONSILLECTOMY    . TOTAL ABDOMINAL HYSTERECTOMY  1980's   . TOTAL HIP ARTHROPLASTY Right 04/28/2014   Procedure: RIGHT TOTAL HIP ARTHROPLASTY ANTERIOR APPROACH;  Surgeon: CMcarthur Rossetti  MD;  [facility]: [facility];  Service: Orthopedics;  Laterality: Right;  Marland Kitchen VIDEO ASSISTED THORACOSCOPY (VATS)/WEDGE RESECTION Right 06/07/2013   Procedure: VIDEO ASSISTED THORACOSCOPY (VATS)/right upper lobectomy, On Q;  Surgeon: [name], MD;  Location: [place];  Service: Thoracic;  Laterality: Right;  Marland Kitchen VIDEO BRONCHOSCOPY N/A 06/07/2013   Procedure: VIDEO BRONCHOSCOPY;  Surgeon: [name], MD;  Location: [facility] OR;  Service: Thoracic;  Laterality: N/A;  . VIDEO BRONCHOSCOPY WITH ENDOBRONCHIAL  NAVIGATION N/A 05/04/2013   Procedure: VIDEO BRONCHOSCOPY WITH ENDOBRONCHIAL NAVIGATION;  Surgeon: [name], MD;  Location: [place];  Service: Thoracic;  Laterality: N/A;     Medications:  Outpatient Encounter Prescriptions as of 10/18/2015  Medication Sig Note  . amoxicillin-clavulanate (AUGMENTIN) 875-125 MG tablet  10/18/2015: Received from: External Pharmacy  . Biotin 1000 MCG tablet Take 1,000 mcg by mouth daily.    . Calcium Carbonate-Vitamin D (CALCIUM 500 + D) 500-125 MG-UNIT TABS Take 1 tablet by mouth daily.    . diclofenac sodium (VOLTAREN) 1 % GEL APPLY 2-4 GRAMS UP TO 4 TIMES A DAY AS NEEDED FOR PAIN 08/09/2015: Received from: External Pharmacy  . docusate sodium (STOOL SOFTENER) 100 MG capsule Take 100 mg by mouth 2 (two) times daily.     Marland Kitchen ESTRACE VAGINAL 0.1 MG/GM vaginal cream USE AS DIRECTED   . furosemide (LASIX) 20 MG tablet Take 1 tablet (20 mg total) by mouth daily.   Marland Kitchen gabapentin (NEURONTIN) 300 MG capsule Take 1 capsule (300 mg total) by mouth 3 (three) times daily.   . Glucosamine-Chondroit-Vit C-Mn (GLUCOSAMINE CHONDR 1500 COMPLX PO) Take 1 capsule by mouth daily.    Marland Kitchen HYDROcodone-acetaminophen (NORCO/VICODIN) 5-325 MG tablet Take 1 tablet by mouth every 12 (twelve) hours as needed. for pain 08/09/2015: Received from: External Pharmacy  . meloxicam (MOBIC) 7.5 MG tablet Take 7.5 mg by mouth 2 (two) times daily as needed for pain.   . Multiple Vitamin (MULTIVITAMIN) tablet Take 1 tablet by mouth daily.     Marland Kitchen omeprazole (PRILOSEC) 20 MG capsule TAKE 1 CAPSULE (20 MG TOTAL) BY MOUTH 2 (TWO) TIMES DAILY BEFORE A MEAL.   Marland Kitchen spironolactone (ALDACTONE) 25 MG tablet Take 12.5 mg by mouth daily.  08/01/2014: Received from: External Pharmacy  . [DISCONTINUED] gabapentin (NEURONTIN) 300 MG capsule Take 300 mg by mouth 2 (two) times daily.    No facility-administered encounter medications on file as of 10/18/2015.      Allergies:  Allergies  Allergen Reactions  .  Amlodipine Rash  . Prochlorperazine Edisylate Anaphylaxis    Compazine  . Aspirin     REACTION: nose bleeds    Family History: Family History  Problem Relation Age of Onset  . Coronary artery disease Father   . Colon cancer Father   . Diabetes Father   . Cancer Father     colon  . Other Mother 75    MVA  . Healthy Sister   . Other Brother     Killed on war  . Healthy Daughter   . Esophageal cancer Neg Hx   . Kidney disease Neg Hx   . Liver disease Neg Hx     Social History: Social History  Substance Use Topics  . Smoking status: Never Smoker  . Smokeless tobacco: Never Used  . Alcohol use Yes     Comment: rare/social   Social History   Social History Narrative   Lives with husband, daughter and grandchild local.   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   EYES: No visual changes or eye pain ENT: No hearing changes.  No history of nose bleeds.   RESPIRATORY: No cough, wheezing and shortness of breath.   CARDIOVASCULAR: Negative for chest pain, and palpitations.   GI: Negative for abdominal discomfort, blood in stools or black stools.  No recent change in bowel habits.   GU:  No history of incontinence.   MUSCLOSKELETAL: +history of joint pain or swelling.  No myalgias.   SKIN: Negative for lesions, rash, and itching.   HEMATOLOGY/ONCOLOGY: Negative for prolonged bleeding, bruising easily, and swollen nodes.  +history of cancer.   ENDOCRINE: Negative for cold or heat intolerance, polydipsia or goiter.   PSYCH:  No depression or anxiety symptoms.   NEURO: As Above.   Vital Signs:  BP 120/70   Pulse 99   Ht _0  (1.575 m)   Wt 105 lb 3 oz (47.7 kg)   SpO2 98%   BMI 19.24 kg/m    General Medical Exam:   General:  Very thin-appearing, comfortable.   Eyes/ENT: see cranial nerve examination.   Neck: No masses appreciated.  Full range  of motion without tenderness.  No carotid bruits. Respiratory:  Clear to auscultation, good air entry bilaterally.   Cardiac:  Regular rate and rhythm, no murmur.   Extremities:  She has moderate joint deformities involving the hands from rheumatoid arthritis Skin:  Bilateral lower extremities and feet are erythematous, warm, mild ankle swelling.   Neurological Exam: MENTAL STATUS including orientation to time, place, person, recent and remote memory, attention span and concentration, language, and fund of knowledge is normal.  Speech is not dysarthric.  CRANIAL NERVES: II:  No visual field defects.  Unremarkable fundi.   III-IV-VI:  Left Horner's syndrome with ipsilateral ptosis (moderate, old), miosis, and anhidrosis. Right pupil is round and reactive to light.  Normal conjugate, extra-ocular eye movements in all directions of gaze.  No nystagmus.   V:  Normal facial sensation.   VII:  Normal facial symmetry and movements.  VIII:  Normal hearing and vestibular function.   IX-X:  Normal palatal movement.   XI:  Normal shoulder shrug and head rotation.   XII:  Normal tongue strength and range of motion, no deviation or fasciculation.  MOTOR:  No atrophy, fasciculations or abnormal movements.  No pronator drift.  Tone is normal.    Right Upper Extremity:    Left Upper Extremity:    Deltoid  5/5   Deltoid  5/5   Biceps  5/5   Biceps  5/5   Triceps  5/5   Triceps  5/5   Wrist extensors  5/5   Wrist extensors  5/5   Wrist flexors  5/5   Wrist flexors  5/5   Finger extensors  5/5   Finger extensors  5/5   Finger flexors  5/5   Finger flexors  5/5   Dorsal interossei  5/5   Dorsal interossei  5/5   Abductor pollicis  5/5   Abductor pollicis  5/5   Tone (Ashworth scale)  0  Tone (Ashworth scale)  0   Right Lower Extremity:    Left Lower Extremity:    Hip flexors  5/5   Hip flexors  5/5   Hip extensors  5/5   Hip extensors  5/5   Knee flexors  5/5  Knee flexors  5/5   Knee extensors   5/5   Knee extensors  5/5   Dorsiflexors  5/5   Dorsiflexors  5/5   Plantarflexors  5/5   Plantarflexors  5/5   Toe extensors  5/5   Toe extensors  5/5   Toe flexors  3/5   Toe flexors  3/5   Tone (Ashworth scale)  0  Tone (Ashworth scale)  0   MSRs:  Right                                                                 Left brachioradialis 2+  brachioradialis 2+  biceps 2+  biceps 2+  triceps 2+  triceps 2+  patellar 2+  patellar 2+  ankle jerk 0  ankle jerk 0  Hoffman no  Hoffman no  plantar response down  plantar response down   SENSORY:  Vibration is reduced to 80% at the left knee, and absent distal to ankles bilaterally. Pinprick and temperature is reduced in a gradient pattern below the ankles. Romberg's sign absent.   COORDINATION/GAIT: Normal finger-to- nose-finger and heel-to-shin.  Intact rapid alternating movements bilaterally.  Able to rise from a chair without using arms.  Stooped posture, gait appears narrow and mildly unsteady. She is unable to perform stressed with tandem gait due to unsteadiness.    IMPRESSION: Mrs. Onken is a 82 year-old female referred for evaluation of bilateral feet burning dysesthesias.  Although she certainly has signs of peripheral neuropathy on her exam, the degree of erythema with associated edema is suggestive of erythromelalgia.  She will have electrodiagnostic testing to determine the severity of her neuropathy as well as serology testing looking for treatable causes. I also want her to see her rheumatologist for their opinion on immune-mediated vasculitis causing erythromelalgia.  PLAN/RECOMMENDATIONS:  1.  Check ESR, CRP, vitamin B12, folate, copper 2.  NCS/EMG of the lower extremities 3.  Recommend follow-up with Dr. Gerilyn Nestle for possible erythromelalgia  4.  Increase gabapentin to 349m three times daily 5.  Avoid immersing the feet in cold water  Return to clinic in 3 months.   The duration of this appointment visit was 60  minutes of face-to-face time with the patient.  Greater than 50% of this time was spent in counseling, explanation of diagnosis, planning of further management, and coordination of care.   Thank you for allowing me to participate in patient's care.  If I can answer any additional questions, I would be pleased to do so.    Sincerely,    Sherena Machorro K. PPosey Pronto DO

## 2015-10-19 LAB — VITAMIN B12: Vitamin B-12: >1500 pg/mL — ABNORMAL HIGH (ref 211–911)

## 2015-10-19 LAB — FOLATE

## 2015-10-19 NOTE — Progress Notes (Signed)
Note faxed.

## 2015-10-20 LAB — COPPER, SERUM: COPPER: 105 ug/dL (ref 72–166)

## 2015-10-23 ENCOUNTER — Ambulatory Visit (INDEPENDENT_AMBULATORY_CARE_PROVIDER_SITE_OTHER): Payer: Medicare Other | Admitting: Neurology

## 2015-10-23 ENCOUNTER — Encounter: Payer: Self-pay | Admitting: *Deleted

## 2015-10-23 DIAGNOSIS — I7381 Erythromelalgia: Secondary | ICD-10-CM

## 2015-10-23 DIAGNOSIS — G609 Hereditary and idiopathic neuropathy, unspecified: Secondary | ICD-10-CM

## 2015-10-23 NOTE — Procedures (Signed)
[facility]  [place], [address]  [place], [place] [address] Tel: [phone] Fax:  [phone] Test Date:  10/23/2015  Patient: [name] DOB: Aug 23, 1930 Physician: Narda Amber, DO  Sex: Female Height: '5\' 4"'$  Ref Phys: Narda Amber, DO  ID#: [id number] Temp: 32.7C Technician: Jerilynn Mages. Dean   Patient Complaints: This is a 82 year old female referred for evaluation of bilateral feet numbness, tingling, swelling, and erythema.  NCV & EMG Findings: Extensive electrodiagnostic testing of the left lower extremity and additional studies of the right shows: 1. Bilateral sural and superficial peroneal sensory responses are absent. 2. Bilateral peroneal and tibial motor responses are within normal limits. 3. Left tibial H reflex study is prolonged. Right tibial reflex study is within normal limits. 4. Chronic motor axon loss changes are seen affecting the muscles below the knee and conform to a gradient pattern. There is no evidence of accompanied active denervation.  Impression: 1. The electrodiagnostic testing is most consistent with a distal and symmetric sensorimotor polyneuropathy, axon loss in type, affecting the lower extremities. 2. There is no evidence of a lumbosacral radiculopathy.   ___________________________ Narda Amber, DO    Nerve Conduction Studies Anti Sensory Summary Table   Stim Site NR Peak (ms) Norm Peak (ms) P-T Amp (V) Norm P-T Amp  Left Sup Peroneal Anti Sensory (Ant Lat Mall)  32.7C  12 cm NR  <4.6  >3  Right Sup Peroneal Anti Sensory (Ant Lat Mall)  32.7C  12 cm NR  <4.6  >3  Left Sural Anti Sensory (Lat Mall)  32.7C  Calf NR  <4.6  >3  Right Sural Anti Sensory (Lat Mall)  32.7C  Calf NR  <4.6  >3   Motor Summary Table   Stim Site NR Onset (ms) Norm Onset (ms) O-P Amp (mV) Norm O-P Amp Site1 Site2 Delta-0 (ms) Dist (cm) Vel (m/s) Norm Vel (m/s)  Left Peroneal Motor (Ext Dig Brev)  32.7C  Ankle    3.6 <6.0 2.6 >2.5 B Fib Ankle  7.7 33.0 43 >40  B Fib    11.3  2.6  Poplt B Fib 2.2 10.0 45 >40  Poplt    13.5  2.4         Right Peroneal Motor (Ext Dig Brev)  32.7C  Ankle    3.4 <6.0 2.9 >2.5 B Fib Ankle 8.0 33.0 41 >40  B Fib    11.4  2.5  Poplt B Fib 2.0 10.0 50 >40  Poplt    13.4  2.5         Left Tibial Motor (Abd Hall Brev)  32.7C  Ankle    3.2 <6.0 5.7 >4 Knee Ankle 9.1 39.0 43 >40  Knee    12.3  3.8         Right Tibial Motor (Abd Hall Brev)  32.7C  Ankle    3.1 <6.0 7.8 >4 Knee Ankle 8.9 38.0 43 >40  Knee    12.0  3.2          H Reflex Studies   NR H-Lat (ms) Lat Norm (ms) L-R H-Lat (ms) M-Lat (ms) HLat-MLat (ms)  Left Tibial (Gastroc)  32.7C     35.10 <35 1.63 3.81 31.29  Right Tibial (Gastroc)  32.7C     33.47 <35 1.63 3.81 29.66   EMG   Side Muscle Ins Act Fibs Psw Fasc Number Recrt Dur Dur. Amp Amp. Poly Poly. Comment  Left AntTibialis Nml Nml Nml Nml 1- Rapid Few 1+  Few 1+ Nml Nml N/A  Left Gastroc Nml Nml Nml Nml 1- Rapid Few 1+ Few 1+ Nml Nml N/A  Left Flex Dig Long Nml Nml Nml Nml 1- Rapid Few 1+ Few 1+ Nml Nml N/A  Left RectFemoris Nml Nml Nml Nml Nml Nml Nml Nml Nml Nml Nml Nml N/A  Left GluteusMed Nml Nml Nml Nml Nml Nml Nml Nml Nml Nml Nml Nml N/A  Left BicepsFemS Nml Nml Nml Nml Nml Nml Nml Nml Nml Nml Nml Nml N/A  Right AntTibialis Nml Nml Nml Nml 1- Rapid Few 1+ Few 1+ Nml Nml N/A  Right Gastroc Nml Nml Nml Nml 1- Rapid Few 1+ Few 1+ Nml Nml N/A  Right Flex Dig Long Nml Nml Nml Nml 1- Rapid Few 1+ Few 1+ Nml Nml N/A      Waveforms:

## 2015-11-13 ENCOUNTER — Ambulatory Visit: Payer: Medicare Other | Admitting: Neurology

## 2015-11-30 ENCOUNTER — Ambulatory Visit: Payer: Medicare Other | Admitting: Neurology

## 2015-12-17 ENCOUNTER — Ambulatory Visit (INDEPENDENT_AMBULATORY_CARE_PROVIDER_SITE_OTHER): Payer: Medicare Other | Admitting: Neurology

## 2015-12-17 ENCOUNTER — Encounter: Payer: Self-pay | Admitting: Neurology

## 2015-12-17 VITALS — BP 140/74 | HR 82 | Ht 62.0 in | Wt 104.5 lb

## 2015-12-17 DIAGNOSIS — I7381 Erythromelalgia: Secondary | ICD-10-CM

## 2015-12-17 DIAGNOSIS — G609 Hereditary and idiopathic neuropathy, unspecified: Secondary | ICD-10-CM

## 2015-12-17 MED ORDER — GABAPENTIN 300 MG PO CAPS: ORAL_CAPSULE | ORAL | 5 refills | Status: DC

## 2015-12-17 NOTE — Progress Notes (Signed)
Follow-up Visit   Date: 12/17/15  [name] MRN: [id number] DOB: August 23, 1930   Interim History: [name] is a 82 y.o. right-handed Caucasian female with rheumatoid arthritis, GERD, lung cancer s/p resection (2015) returning to the clinic for follow-up of neuropathy.  The patient was accompanied to the clinic by self.   History of present illness: For the past several years, she has developed burning sensation of the lower legs and feet and redness. There is increased warmth of the feet.  She recalls having severe pain with wearing socks and anything that touches her feet. It is much worse with laying supine.  She occasionally also has burning pain, numbness, and tingling involving the hands. In July 2017, she began having increased swelling and was treated for possible cellulitis.  She was previously on plaquenil due to early signs of macular degeneration.  She did not tolerate methotrexate.  Her initial autoimmune manifestations was when she was in college and developed severe discoloration of the hands, ultimately diagnosed with Raynaud's disease.  Because of the severity of her disease, she underwent thoracic sympathectomy and developed iatrogenic left Horner's syndrome.    She was seeing Dr. Baltazar Najjar at [facility] who diagnosed her with neuropathy and started her on gabapentin '300mg'$  twice daily.  UPDATE 12/17/2015:   She was diagnosed with erytheomelalgia by her rheumatologist, but unfortunately, there is no therapy for this. She is trying to avoid alcohol, salt, and charcoaled foods which can aggravate her symptoms.  She continues to have burning pain of the feet and takes gabapentin '300mg'$  TID.  She suffered a mechanical fall several weeks ago.  She has been noncompliant with using her cane.  No new complaints.  Medications:  Current Outpatient Prescriptions on File Prior to Visit  Medication Sig Dispense Refill  . Biotin 1000 MCG tablet Take 1,000 mcg by  mouth daily.     . Calcium Carbonate-Vitamin D (CALCIUM 500 + D) 500-125 MG-UNIT TABS Take 1 tablet by mouth daily.     . diclofenac sodium (VOLTAREN) 1 % GEL APPLY 2-4 GRAMS UP TO 4 TIMES A DAY AS NEEDED FOR PAIN  2  . docusate sodium (STOOL SOFTENER) 100 MG capsule Take 100 mg by mouth 2 (two) times daily.      Marland Kitchen ESTRACE VAGINAL 0.1 MG/GM vaginal cream USE AS DIRECTED 42.5 g 2  . furosemide (LASIX) 20 MG tablet Take 1 tablet (20 mg total) by mouth daily. 30 tablet 3  . gabapentin (NEURONTIN) 300 MG capsule Take 1 capsule (300 mg total) by mouth 3 (three) times daily. 90 capsule 5  . Glucosamine-Chondroit-Vit C-Mn (GLUCOSAMINE CHONDR 1500 COMPLX PO) Take 1 capsule by mouth daily.     Marland Kitchen HYDROcodone-acetaminophen (NORCO/VICODIN) 5-325 MG tablet Take 1 tablet by mouth every 12 (twelve) hours as needed. for pain  0  . meloxicam (MOBIC) 7.5 MG tablet Take 7.5 mg by mouth 2 (two) times daily as needed for pain.    . Multiple Vitamin (MULTIVITAMIN) tablet Take 1 tablet by mouth daily.      Marland Kitchen omeprazole (PRILOSEC) 20 MG capsule TAKE 1 CAPSULE (20 MG TOTAL) BY MOUTH 2 (TWO) TIMES DAILY BEFORE A MEAL. 60 capsule 6  . spironolactone (ALDACTONE) 25 MG tablet Take 12.5 mg by mouth daily.   2   No current facility-administered medications on file prior to visit.     Allergies:  Allergies  Allergen Reactions  . Amlodipine Rash  . Prochlorperazine Edisylate Anaphylaxis    Compazine  .  Aspirin     REACTION: nose bleeds    Review of Systems:  CONSTITUTIONAL: No fevers, chills, night sweats, or weight loss.  EYES: No visual changes or eye pain ENT: No hearing changes.  No history of nose bleeds.   RESPIRATORY: No cough, wheezing and shortness of breath.   CARDIOVASCULAR: Negative for chest pain, and palpitations.   GI: Negative for abdominal discomfort, blood in stools or black stools.  No recent change in bowel habits.   GU:  No history of incontinence.   MUSCLOSKELETAL: +history of joint pain or  swelling.  No myalgias.   SKIN: Negative for lesions, rash, and itching.   ENDOCRINE: Negative for cold or heat intolerance, polydipsia or goiter.   PSYCH:  No depression or anxiety symptoms.   NEURO: As Above.   Vital Signs:  BP 140/74   Pulse 82   Ht '5\' 2"'$  (1.575 m)   Wt 104 lb 8 oz (47.4 kg)   SpO2 91%   BMI 19.11 kg/m   Neurological Exam: MENTAL STATUS including orientation to time, place, person, recent and remote memory, attention span and concentration, language, and fund of knowledge is normal.  Speech is not dysarthric.  CRANIAL NERVES:  Left Horner's syndrome with ipsilateral ptosis (moderate, old), miosis. Right pupil is round and reactive to light. Pupils equal round and reactive to light.  Normal conjugate, extra-ocular eye movements in all directions of gaze.  No ptosis. Normal facial sensation.  Face is symmetric. Palate elevates symmetrically.  Tongue is midline.  MOTOR:  Motor strength is 5/5 in all extremities, except toe flexors are 3/5 bilaterally.  No atrophy, fasciculations or abnormal movements.  No pronator drift.  Tone is normal.    SENSORY:  Vibration is absent distal to ankles bilaterally.  COORDINATION/GAIT:  Stooped posture, gait appears narrow and mildly unsteady.  She is not using her cane.  Data: Labs 2016:  SPEP with IFE neg, ANA positive, SSA/B neg, CCP neg  MRI brain wwo contrast 08/02/2015: No acute or metastatic intracranial abnormality.  Normal for age MRI appearance of the brain.  NCS/EMG of the legs 10/23/2015: 1. The electrodiagnostic testing is most consistent with a distal and symmetric sensorimotor polyneuropathy, axon loss in type, affecting the lower extremities. 2. There is no evidence of a lumbosacral radiculopathy.  IMPRESSION/PLAN: 1.  Neuropathy associated with autoimmune disorder (Sjogren's disease), clinically stable but still with breakthrough pain at nighttime  - Increase gabapentin to  '300mg'$  in the morning, '300mg'$  in the  afternoon, and '600mg'$  at bedtime.  With her CKD, would not taper this any higher  - Start Aspercream to feet twice daily  - Fall precautions discussed and strongly urged her to use a cane  2.  Erythromelalgia, followed by rheumatology  Return to clinic in 4 months  The duration of this appointment visit was 30 minutes of face-to-face time with the patient.  Greater than 50% of this time was spent in counseling, explanation of diagnosis, planning of further management, and coordination of care.   Thank you for allowing me to participate in patient's care.  If I can answer any additional questions, I would be pleased to do so.    Sincerely,    Tynisha Ogan K. Posey Pronto, DO

## 2015-12-17 NOTE — Patient Instructions (Addendum)
1.  Take gabapentin '300mg'$  in the morning, '300mg'$  in the afternoon, and '600mg'$  at bedtime. 2.  Start Aspercream to feet twice daily  Return to clinic in 4-6 months

## 2015-12-19 ENCOUNTER — Other Ambulatory Visit: Payer: Self-pay | Admitting: Family Medicine

## 2016-02-04 ENCOUNTER — Encounter: Payer: Self-pay | Admitting: Physician Assistant

## 2016-02-04 ENCOUNTER — Ambulatory Visit (INDEPENDENT_AMBULATORY_CARE_PROVIDER_SITE_OTHER): Payer: Medicare Other | Admitting: Physician Assistant

## 2016-02-04 VITALS — BP 122/70 | HR 72 | Temp 98.2°F | Resp 14 | Ht 62.0 in | Wt 103.0 lb

## 2016-02-04 DIAGNOSIS — J209 Acute bronchitis, unspecified: Secondary | ICD-10-CM | POA: Diagnosis not present

## 2016-02-04 MED ORDER — ALBUTEROL SULFATE HFA 108 (90 BASE) MCG/ACT IN AERS: 2.0000 | INHALATION_SPRAY | Freq: Four times a day (QID) | RESPIRATORY_TRACT | 0 refills | Status: DC | PRN

## 2016-02-04 MED ORDER — DOXYCYCLINE HYCLATE 100 MG PO CAPS: 100.0000 mg | ORAL_CAPSULE | Freq: Two times a day (BID) | ORAL | 0 refills | Status: DC

## 2016-02-04 NOTE — Patient Instructions (Signed)
Increase fluids.  Get plenty of rest. Use Plain Mucinex or Delsym for cough/congestion. Use the Albuterol inhaler as directed. Take a daily probiotic (I recommend Align or Culturelle, but even Activia Yogurt may be beneficial).  A humidifier placed in the bedroom may offer some relief for a dry, scratchy throat of nasal irritation.   If symptoms worsen, please start the antibiotic given (Doxycycline)   Read information below on acute bronchitis. Please call or return to clinic if symptoms are not improving.  Acute Bronchitis Bronchitis is when the airways that extend from the windpipe into the lungs get red, puffy, and painful (inflamed). Bronchitis often causes thick spit (mucus) to develop. This leads to a cough. A cough is the most common symptom of bronchitis. In acute bronchitis, the condition usually begins suddenly and goes away over time (usually in 2 weeks). Smoking, allergies, and asthma can make bronchitis worse. Repeated episodes of bronchitis may cause more lung problems.  HOME CARE  Rest.  Drink enough fluids to keep your pee (urine) clear or pale yellow (unless you need to limit fluids as told by your doctor).  Only take over-the-counter or prescription medicines as told by your doctor.  Avoid smoking and secondhand smoke. These can make bronchitis worse. If you are a smoker, think about using nicotine gum or skin patches. Quitting smoking will help your lungs heal faster.  Reduce the chance of getting bronchitis again by:  Washing your hands often.  Avoiding people with cold symptoms.  Trying not to touch your hands to your mouth, nose, or eyes.  Follow up with your doctor as told.  GET HELP IF: Your symptoms do not improve after 1 week of treatment. Symptoms include:  Cough.  Fever.  Coughing up thick spit.  Body aches.  Chest congestion.  Chills.  Shortness of breath.  Sore throat.  GET HELP RIGHT AWAY IF:   You have an increased fever.  You  have chills.  You have severe shortness of breath.  You have bloody thick spit (sputum).  You throw up (vomit) often.  You lose too much body fluid (dehydration).  You have a severe headache.  You faint.  MAKE SURE YOU:   Understand these instructions.  Will watch your condition.  Will get help right away if you are not doing well or get worse. Document Released: 07/23/2007 Document Revised: 10/06/2012 Document Reviewed: 07/27/2012 [facility] Patient Information 2015 [place], [place]. This information is not intended to replace advice given to you by your health care provider. Make sure you discuss any questions you have with your health care provider.

## 2016-02-04 NOTE — Progress Notes (Signed)
Patient presents to clinic today c/o 1 week of hoarseness, upper airway congestion with a cough that has been productive but now dry over the past couple of days. Notes sore throat. Denies fever, chills. Denies sinus pressure or sinus pain. Does note some chest tightness and wheeze. Patient with history of lung cancer in remission, s/p partial lobectomy. .   Past Medical History:  Diagnosis Date  . Abnormal ECG 11/2008   left bbb  . Arthritis   . Back pain    scoliosis  . Cancer ([place]) 2015   lung  . Colon polyps   . Constipation    takes Colace daily  . Dry eyes    eye drops used   . GERD (gastroesophageal reflux disease)    takes Omeprazole daily  . Iron deficiency anemia   . Lupus    takes Plaquenil daily  . Osteoporosis   . PONV (postoperative nausea and vomiting)    likes phenergan  . Raynaud's phenomenon 1965  . Rheumatoid arthritis ([place])   . Sciatica   . Sjogren's syndrome [facility])     Current Outpatient Prescriptions on File Prior to Visit  Medication Sig Dispense Refill  . Biotin 1000 MCG tablet Take 1,000 mcg by mouth daily.     . Calcium Carbonate-Vitamin D (CALCIUM 500 + D) 500-125 MG-UNIT TABS Take 1 tablet by mouth daily.     . diclofenac sodium (VOLTAREN) 1 % GEL APPLY 2-4 GRAMS UP TO 4 TIMES A DAY AS NEEDED FOR PAIN  2  . docusate sodium (STOOL SOFTENER) 100 MG capsule Take 100 mg by mouth 2 (two) times daily.      Marland Kitchen ESTRACE VAGINAL 0.1 MG/GM vaginal cream USE AS DIRECTED 42.5 g 2  . gabapentin (NEURONTIN) 300 MG capsule Take '300mg'$  in the morning, '300mg'$  in the afternoon, and '600mg'$  at bedtime. 120 capsule 5  . Glucosamine-Chondroit-Vit C-Mn (GLUCOSAMINE CHONDR 1500 COMPLX PO) Take 1 capsule by mouth daily.     Marland Kitchen HYDROcodone-acetaminophen (NORCO/VICODIN) 5-325 MG tablet Take 1 tablet by mouth every 12 (twelve) hours as needed. for pain  0  . meloxicam (MOBIC) 7.5 MG tablet Take 7.5 mg by mouth 2 (two) times daily as needed for pain.    . Multiple Vitamin  (MULTIVITAMIN) tablet Take 1 tablet by mouth daily.      Marland Kitchen omeprazole (PRILOSEC) 20 MG capsule TAKE 1 CAPSULE (20 MG TOTAL) BY MOUTH 2 (TWO) TIMES DAILY BEFORE A MEAL. 60 capsule 6  . spironolactone (ALDACTONE) 25 MG tablet Take 12.5 mg by mouth daily.   2   No current facility-administered medications on file prior to visit.     Allergies  Allergen Reactions  . Amlodipine Rash  . Prochlorperazine Edisylate Anaphylaxis    Compazine  . Aspirin     REACTION: nose bleeds    Family History  Problem Relation Age of Onset  . Coronary artery disease Father   . Colon cancer Father   . Diabetes Father   . Cancer Father     colon  . Other Mother 68    MVA  . Healthy Sister   . Other Brother     Killed on war  . Healthy Daughter   . Esophageal cancer Neg Hx   . Kidney disease Neg Hx   . Liver disease Neg Hx     Social History   Social History  . Marital status: Married    Spouse name: N/A  . Number of children: 2  .  Years of education: N/A   Occupational History  . n/a    Social History Main Topics  . Smoking status: Never Smoker  . Smokeless tobacco: Never Used  . Alcohol use Yes     Comment: rare/social  . Drug use: No  . Sexual activity: Not Currently   Other Topics Concern  . None   Social History Narrative   Lives with husband, daughter and grandchild local.   Highest level of education:  masters in education admin and technology      Review of Systems - See HPI.  All other ROS are negative.  BP 122/70   Pulse 72   Temp 98.2 F (36.8 C) (Oral)   Resp 14   Ht '5\' 2"'$  (1.575 m)   Wt 103 lb (46.7 kg)   SpO2 95%   BMI 18.84 kg/m   Physical Exam  Constitutional: She is oriented to person, place, and time and well-developed, well-nourished, and in no distress.  HENT:  Head: Normocephalic and atraumatic.  Right Ear: External ear normal.  Left Ear: External ear normal.  Nose: Nose normal.  Mouth/Throat: Oropharynx is clear and moist. No oropharyngeal  exudate.  TM within normal limits bilaterally.   Eyes: Conjunctivae are normal.  Neck: Neck supple.  Cardiovascular: Normal rate, regular rhythm, normal heart sounds and intact distal pulses.   Pulmonary/Chest: Effort normal and breath sounds normal. No respiratory distress. She has no wheezes. She has no rales. She exhibits no tenderness.  Neurological: She is alert and oriented to person, place, and time.  Skin: Skin is warm and dry. No rash noted.  Psychiatric: Affect normal.  Vitals reviewed.  Assessment/Plan: 1. Acute bronchitis, unspecified organism Suspect viral. Patient at increased risk for complication of a bacterial infection. Will start supportive measures, OTC medications and Rx Albuterol. Rx doxycycline printed for patient to start if symptoms are not improving over 48 hours or if productive cough recurs.   - albuterol (PROVENTIL HFA;VENTOLIN HFA) 108 (90 Base) MCG/ACT inhaler; Inhale 2 puffs into the lungs every 6 (six) hours as needed for wheezing or shortness of breath.  Dispense: 1 Inhaler; Refill: 0   Leeanne Rio, [place]

## 2016-02-04 NOTE — Progress Notes (Signed)
Pre visit review using our clinic review tool, if applicable. No additional management support is needed unless otherwise documented below in the visit note. 

## 2016-02-05 ENCOUNTER — Ambulatory Visit: Payer: Medicare Other | Admitting: Medical

## 2016-02-07 ENCOUNTER — Ambulatory Visit (HOSPITAL_COMMUNITY): Payer: Medicare Other

## 2016-02-07 ENCOUNTER — Other Ambulatory Visit: Payer: Medicare Other

## 2016-02-09 ENCOUNTER — Other Ambulatory Visit: Payer: Self-pay | Admitting: Nurse Practitioner

## 2016-02-13 ENCOUNTER — Telehealth: Payer: Self-pay | Admitting: Internal Medicine

## 2016-02-13 NOTE — Telephone Encounter (Signed)
w pt to confirm r/s appt date/times per pt r/s her CT scan to 02/21/16

## 2016-02-14 ENCOUNTER — Ambulatory Visit: Payer: Medicare Other | Admitting: Internal Medicine

## 2016-02-21 ENCOUNTER — Other Ambulatory Visit (HOSPITAL_BASED_OUTPATIENT_CLINIC_OR_DEPARTMENT_OTHER): Payer: Medicare Other

## 2016-02-21 ENCOUNTER — Ambulatory Visit (HOSPITAL_COMMUNITY)
Admission: RE | Admit: 2016-02-21 | Discharge: 2016-02-21 | Disposition: A | Discharge status: Home / Self Care | Payer: Medicare Other | Source: Ambulatory Visit | Attending: Internal Medicine | Admitting: Internal Medicine

## 2016-02-21 DIAGNOSIS — Z9889 Other specified postprocedural states: Secondary | ICD-10-CM | POA: Diagnosis not present

## 2016-02-21 DIAGNOSIS — C3411 Malignant neoplasm of upper lobe, right bronchus or lung: Secondary | ICD-10-CM

## 2016-02-21 DIAGNOSIS — Z902 Acquired absence of lung [part of]: Secondary | ICD-10-CM | POA: Diagnosis not present

## 2016-02-21 DIAGNOSIS — Z85118 Personal history of other malignant neoplasm of bronchus and lung: Secondary | ICD-10-CM | POA: Diagnosis not present

## 2016-02-21 DIAGNOSIS — K449 Diaphragmatic hernia without obstruction or gangrene: Secondary | ICD-10-CM | POA: Diagnosis not present

## 2016-02-21 DIAGNOSIS — I700 Atherosclerosis of aorta: Secondary | ICD-10-CM | POA: Insufficient documentation

## 2016-02-21 LAB — COMPREHENSIVE METABOLIC PANEL
ALBUMIN: 3.9 g/dL (ref 3.5–5.0)
ALK PHOS: 53 U/L (ref 40–150)
ALT: 12 U/L (ref 0–55)
ANION GAP: 9 meq/L (ref 3–11)
AST: 27 U/L (ref 5–34)
BUN: 24.1 mg/dL (ref 7.0–26.0)
CALCIUM: 9.3 mg/dL (ref 8.4–10.4)
CO2: 24 mEq/L (ref 22–29)
CREATININE: 1.1 mg/dL (ref 0.6–1.1)
Chloride: 99 mEq/L (ref 98–109)
EGFR: 48 mL/min/{1.73_m2} — ABNORMAL LOW (ref >90)
Glucose: 80 mg/dl (ref 70–140)
POTASSIUM: 4.7 meq/L (ref 3.5–5.1)
Sodium: 132 mEq/L — ABNORMAL LOW (ref 136–145)
Total Bilirubin: 0.35 mg/dL (ref 0.20–1.20)
Total Protein: 6.7 g/dL (ref 6.4–8.3)

## 2016-02-21 MED ORDER — IOPAMIDOL (ISOVUE-300) INJECTION 61%
INTRAVENOUS | Status: AC
  Filled 2016-02-21: qty 75

## 2016-02-21 MED ORDER — IOPAMIDOL (ISOVUE-300) INJECTION 61%
75.0000 mL | Freq: Once | INTRAVENOUS | Status: DC | PRN
  Administered 2016-02-21: 75 mL via INTRAVENOUS
  Filled 2016-02-21: qty 75

## 2016-02-22 ENCOUNTER — Ambulatory Visit (HOSPITAL_BASED_OUTPATIENT_CLINIC_OR_DEPARTMENT_OTHER): Payer: Medicare Other | Admitting: Internal Medicine

## 2016-02-22 ENCOUNTER — Encounter: Payer: Self-pay | Admitting: Internal Medicine

## 2016-02-22 ENCOUNTER — Telehealth: Payer: Self-pay | Admitting: Internal Medicine

## 2016-02-22 VITALS — BP 140/80 | HR 91 | Temp 98.4°F | Resp 17 | Ht 62.0 in | Wt 104.5 lb

## 2016-02-22 DIAGNOSIS — C342 Malignant neoplasm of middle lobe, bronchus or lung: Secondary | ICD-10-CM

## 2016-02-22 DIAGNOSIS — C3411 Malignant neoplasm of upper lobe, right bronchus or lung: Secondary | ICD-10-CM

## 2016-02-22 NOTE — Progress Notes (Signed)
[place] [phone]   [phone]  OFFICE PROGRESS NOTE  [name], MD [address] [place] [address]  DIAGNOSIS: Stage IIA (T2b, N0, M0) non-small cell lung cancer, adenocarcinoma with positive EGFR mutation in exon 21 ([id number]) presented with right upper lobe lung mass diagnosed in March of 2015.  PRIOR THERAPY: Status post right upper lobectomy with wedge resection of the right middle lobe under the care of Dr. Servando Snare on 06/07/2013  CURRENT THERAPY: Observation.  INTERVAL HISTORY: [name] 83 y.o. female  came to the clinic today for six-month follow-up visit. The patient is feeling fine with no specific complaints except for laryngitis. She was treated recently for bronchitis with antibiotics. She denied having any chest pain or shortness of breath. She has mild cough with no hemoptysis. She is currently under a lot of stress taking care of her husband who was admitted to the hospital with coronary artery disease. She has no significant weight loss or night sweats. She has no fever or chills. She denied having any nausea or vomiting. She had repeat CT scan of the chest performed recently and she is here for evaluation and discussion of her scan results.  MEDICAL HISTORY: Past Medical History:  Diagnosis Date  . Abnormal ECG 11/2008   left bbb  . Arthritis   . Back pain    scoliosis  . Cancer ([place]) 2015   lung  . Colon polyps   . Constipation    takes Colace daily  . Dry eyes    eye drops used   . GERD (gastroesophageal reflux disease)    takes Omeprazole daily  . Iron deficiency anemia   . Lupus    takes Plaquenil daily  . Osteoporosis   . PONV (postoperative nausea and vomiting)    likes phenergan  . Raynaud's phenomenon 1965  . Rheumatoid arthritis ([place])   . Sciatica   . Sjogren's syndrome ([place])     ALLERGIES:  is allergic to amlodipine; prochlorperazine edisylate; and aspirin.  MEDICATIONS:    Current Outpatient Prescriptions  Medication Sig Dispense Refill  . albuterol (PROVENTIL HFA;VENTOLIN HFA) 108 (90 Base) MCG/ACT inhaler Inhale 2 puffs into the lungs every 6 (six) hours as needed for wheezing or shortness of breath. 1 Inhaler 0  . Biotin 1000 MCG tablet Take 1,000 mcg by mouth daily.     . Calcium Carbonate-Vitamin D (CALCIUM 500 + D) 500-125 MG-UNIT TABS Take 1 tablet by mouth daily.     Marland Kitchen docusate sodium (STOOL SOFTENER) 100 MG capsule Take 100 mg by mouth 2 (two) times daily.      Marland Kitchen ESTRACE VAGINAL 0.1 MG/GM vaginal cream USE AS DIRECTED 42.5 g 2  . gabapentin (NEURONTIN) 300 MG capsule Take 363m in the morning, 3028min the afternoon, and 6002mt bedtime. 120 capsule 5  . Glucosamine-Chondroit-Vit C-Mn (GLUCOSAMINE CHONDR 1500 COMPLX PO) Take 1 capsule by mouth daily.     . HMarland KitchenDROcodone-acetaminophen (NORCO/VICODIN) 5-325 MG tablet Take 1 tablet by mouth every 12 (twelve) hours as needed. for pain  0  . meloxicam (MOBIC) 7.5 MG tablet Take 7.5 mg by mouth 2 (two) times daily as needed for pain.    . Multiple Vitamin (MULTIVITAMIN) tablet Take 1 tablet by mouth daily.      . oMarland Kitcheneprazole (PRILOSEC) 20 MG capsule TAKE 1 CAPSULE (20 MG TOTAL) BY MOUTH 2 (TWO) TIMES DAILY BEFORE A MEAL. 60 capsule 6  . spironolactone (  ALDACTONE) 25 MG tablet Take 12.5 mg by mouth daily.   2  . diclofenac sodium (VOLTAREN) 1 % GEL APPLY 2-4 GRAMS UP TO 4 TIMES A DAY AS NEEDED FOR PAIN  2   No current facility-administered medications for this visit.     SURGICAL HISTORY:  Past Surgical History:  Procedure Laterality Date  . APPENDECTOMY    . CARDIOVASCULAR STRESS TEST     12/2008 mild fixed basal to mid septal perfusion defect felt likely due to artifact from LBBB, no ischemia, EF 58%  . CATARACT EXTRACTION Bilateral   . COLONOSCOPY    . LYMPH NODE DISSECTION Right 06/07/2013   Procedure: LYMPH NODE DISSECTION;  Surgeon: [name], MD;  Location: [place];  Service: Thoracic;   Laterality: Right;  . [place]  . THORACOTOMY  06/07/2013   Procedure: MINI/LIMITED THORACOTOMY; right middle lobe wedge resection;  Surgeon: [name], MD;  Location: [place];  Service: Thoracic;;  . TONSILLECTOMY    . TOTAL ABDOMINAL HYSTERECTOMY  1980's   . TOTAL HIP ARTHROPLASTY Right 04/28/2014   Procedure: RIGHT TOTAL HIP ARTHROPLASTY ANTERIOR APPROACH;  Surgeon: [name], MD;  Location: [facility];  Service: Orthopedics;  Laterality: Right;  Marland Kitchen VIDEO ASSISTED THORACOSCOPY (VATS)/WEDGE RESECTION Right 06/07/2013   Procedure: VIDEO ASSISTED THORACOSCOPY (VATS)/right upper lobectomy, On Q;  Surgeon: [name], MD;  Location: [facility];  Service: Thoracic;  Laterality: Right;  Marland Kitchen VIDEO BRONCHOSCOPY N/A 06/07/2013   Procedure: VIDEO BRONCHOSCOPY;  Surgeon: [name], MD;  Location: [facility];  Service: Thoracic;  Laterality: N/A;  . VIDEO BRONCHOSCOPY WITH ENDOBRONCHIAL NAVIGATION N/A 05/04/2013   Procedure: VIDEO BRONCHOSCOPY WITH ENDOBRONCHIAL NAVIGATION;  Surgeon: [name], MD;  Location: [place];  Service: Thoracic;  Laterality: N/A;    REVIEW OF SYSTEMS:  A comprehensive review of systems was negative except for: Ears, nose, mouth, throat, and face: positive for hoarseness Respiratory: positive for cough   PHYSICAL EXAMINATION: General appearance: alert, cooperative and no distress Head: Normocephalic, without obvious abnormality, atraumatic Neck: no adenopathy, no JVD, supple, symmetrical, trachea midline and thyroid not enlarged, symmetric, no tenderness/mass/nodules Lymph nodes: Cervical, supraclavicular, and axillary nodes normal. Resp: clear to auscultation bilaterally Back: symmetric, no curvature. ROM normal. No CVA tenderness. Cardio: regular rate and rhythm, S1, S2 normal, no murmur, click, rub or gallop GI: soft, non-tender; bowel sounds normal; no masses,  no organomegaly Extremities: extremities normal, atraumatic, no  cyanosis or edema  ECOG PERFORMANCE STATUS: 1 - Symptomatic but completely ambulatory  Blood pressure 140/80, pulse 91, temperature 98.4 F (36.9 C), temperature source Oral, resp. rate 17, height _0  (1.575 m), weight 104 lb 8 oz (47.4 kg), SpO2 99 %.  LABORATORY DATA: Lab Results  Component Value Date   WBC 6.6 10/11/2015   HGB 10.7 (L) 10/11/2015   HCT 31.9 (L) 10/11/2015   MCV 89.2 10/11/2015   PLT 174.0 10/11/2015      Chemistry      Component Value Date/Time   NA 132 (L) 02/21/2016 0955   K 4.7 02/21/2016 0955   CL 98 10/11/2015 1113   CO2 24 02/21/2016 0955   BUN 24.1 02/21/2016 0955   CREATININE 1.1 02/21/2016 0955      Component Value Date/Time   CALCIUM 9.3 02/21/2016 0955   ALKPHOS 53 02/21/2016 0955   AST 27 02/21/2016 0955   ALT 12 02/21/2016 0955   BILITOT 0.35 02/21/2016 0955  RADIOGRAPHIC STUDIES: Ct Chest W Contrast  Result Date: 02/21/2016 CLINICAL DATA:  Restaging lung cancer EXAM: CT CHEST WITH CONTRAST TECHNIQUE: Multidetector CT imaging of the chest was performed during intravenous contrast administration. CONTRAST:  57m ISOVUE-300 IOPAMIDOL (ISOVUE-300) INJECTION 61% COMPARISON:  08/02/15 FINDINGS: Cardiovascular: Normal heart size. No pericardial effusion. Aortic atherosclerosis. Mediastinum/Nodes: Small hiatal hernia. The trachea appears patent and is midline. No mediastinal or hilar adenopathy. No axillary or supraclavicular adenopathy. Lungs/Pleura: No pleural effusion. Postoperative changes from right upper lobectomy and right middle lobe wedge resection. Stable left upper lobe ground-glass attenuating nodule measuring 3 mm, image 30 of series 7. Bilateral peripheral scar like densities are again noted and appears similar to previous exam. No new or enlarging pulmonary nodules identified. Upper Abdomen: No acute abnormality. Musculoskeletal: Scoliosis deformity is identified. There is degenerative disc disease identified within the lumbar  spine. IMPRESSION: 1. Status post right upper lobectomy and right middle lobe wedge resection. No finding of local recurrence of disease or metastatic disease in the thorax. 2. Stable areas of chronic post infectious and inflammatory scarring. 3. Hiatal hernia. 4. Aortic atherosclerosis. Electronically Signed   By: TKerby MoorsM.D.   On: 02/21/2016 13:33   ASSESSMENT AND PLAN:  This is a very pleasant 83years old white female with a stage II a non-small cell lung cancer, adenocarcinoma with positive EGFR mutation in exon 21 status post right upper lobectomy with wedge resection of the right middle lobe and the patient declined adjuvant systemic chemotherapy. She has been observation for more than 2 years. She is feeling fine today and the recent CT scan of the chest showed no evidence for disease recurrence. I discussed the scan results with the patient today. I recommended for her to continue on observation with repeat CT scan of the chest in 6 months for restaging of her disease. She was advised to call immediately if she has any concerning symptoms in the interval. The patient voices understanding of current disease status and treatment options and is in agreement with the current care plan.  All questions were answered. The patient knows to call the clinic with any problems, questions or concerns. We can certainly see the patient much sooner if necessary. I spent 10 minutes counseling the patient face to face. The total time spent in the appointment was 15 minutes. Disclaimer: This note was dictated with voice recognition software. Similar sounding words can inadvertently be transcribed and may not be corrected upon review.

## 2016-02-22 NOTE — Telephone Encounter (Signed)
Appointments scheduled per 02/22/16 los. Patient was given a copy of the AVS report and appointment schedule per 02/22/16 los.

## 2016-03-06 ENCOUNTER — Encounter: Payer: Self-pay | Admitting: Family Medicine

## 2016-03-07 MED ORDER — AZITHROMYCIN 250 MG PO TABS: ORAL_TABLET | ORAL | 0 refills | Status: DC

## 2016-03-20 ENCOUNTER — Other Ambulatory Visit: Payer: Self-pay | Admitting: Family Medicine

## 2016-03-20 DIAGNOSIS — Z1231 Encounter for screening mammogram for malignant neoplasm of breast: Secondary | ICD-10-CM

## 2016-03-27 ENCOUNTER — Ambulatory Visit (HOSPITAL_BASED_OUTPATIENT_CLINIC_OR_DEPARTMENT_OTHER): Payer: Medicare Other

## 2016-04-15 ENCOUNTER — Encounter (HOSPITAL_BASED_OUTPATIENT_CLINIC_OR_DEPARTMENT_OTHER): Payer: Self-pay

## 2016-04-15 ENCOUNTER — Ambulatory Visit (HOSPITAL_BASED_OUTPATIENT_CLINIC_OR_DEPARTMENT_OTHER)
Admission: RE | Admit: 2016-04-15 | Discharge: 2016-04-15 | Disposition: A | Discharge status: Home / Self Care | Payer: Medicare Other | Source: Ambulatory Visit | Attending: Family Medicine | Admitting: Family Medicine

## 2016-04-15 DIAGNOSIS — Z1231 Encounter for screening mammogram for malignant neoplasm of breast: Secondary | ICD-10-CM | POA: Diagnosis not present

## 2016-05-23 ENCOUNTER — Ambulatory Visit: Payer: Medicare Other | Admitting: Family Medicine

## 2016-06-11 ENCOUNTER — Ambulatory Visit: Payer: Medicare Other | Admitting: Family Medicine

## 2016-06-16 ENCOUNTER — Encounter: Payer: Self-pay | Admitting: Family Medicine

## 2016-06-18 ENCOUNTER — Ambulatory Visit: Payer: Medicare Other | Admitting: Neurology

## 2016-07-02 ENCOUNTER — Ambulatory Visit (INDEPENDENT_AMBULATORY_CARE_PROVIDER_SITE_OTHER): Payer: Medicare Other | Admitting: Family Medicine

## 2016-07-02 ENCOUNTER — Encounter: Payer: Self-pay | Admitting: Family Medicine

## 2016-07-02 VITALS — BP 130/81 | HR 83 | Temp 98.1°F | Resp 16 | Ht 62.0 in | Wt 105.0 lb

## 2016-07-02 DIAGNOSIS — E43 Unspecified severe protein-calorie malnutrition: Secondary | ICD-10-CM

## 2016-07-02 DIAGNOSIS — E785 Hyperlipidemia, unspecified: Secondary | ICD-10-CM | POA: Diagnosis not present

## 2016-07-02 DIAGNOSIS — M8580 Other specified disorders of bone density and structure, unspecified site: Secondary | ICD-10-CM | POA: Diagnosis not present

## 2016-07-02 DIAGNOSIS — E7800 Pure hypercholesterolemia, unspecified: Secondary | ICD-10-CM

## 2016-07-02 DIAGNOSIS — Z0000 Encounter for general adult medical examination without abnormal findings: Secondary | ICD-10-CM

## 2016-07-02 DIAGNOSIS — M069 Rheumatoid arthritis, unspecified: Secondary | ICD-10-CM | POA: Diagnosis not present

## 2016-07-02 LAB — CBC WITH DIFFERENTIAL/PLATELET
BASOS PCT: 0.9 % (ref 0.0–3.0)
Basophils Absolute: 0 10*3/uL (ref 0.0–0.1)
EOS PCT: 0.7 % (ref 0.0–5.0)
Eosinophils Absolute: 0 10*3/uL (ref 0.0–0.7)
HCT: 29.9 % — ABNORMAL LOW (ref 36.0–46.0)
Hemoglobin: 10.1 g/dL — ABNORMAL LOW (ref 12.0–15.0)
LYMPHS ABS: 0.9 10*3/uL (ref 0.7–4.0)
Lymphocytes Relative: 20.7 % (ref 12.0–46.0)
MCHC: 33.9 g/dL (ref 30.0–36.0)
MCV: 88 fl (ref 78.0–100.0)
MONO ABS: 0.5 10*3/uL (ref 0.1–1.0)
MONOS PCT: 11.1 % (ref 3.0–12.0)
NEUTROS ABS: 2.9 10*3/uL (ref 1.4–7.7)
NEUTROS PCT: 66.6 % (ref 43.0–77.0)
Platelets: 160 10*3/uL (ref 150.0–400.0)
RBC: 3.4 Mil/uL — AB (ref 3.87–5.11)
RDW: 12.9 % (ref 11.5–15.5)
WBC: 4.4 10*3/uL (ref 4.0–10.5)

## 2016-07-02 LAB — BASIC METABOLIC PANEL
BUN: 25 mg/dL — AB (ref 6–23)
CHLORIDE: 95 meq/L — AB (ref 96–112)
CO2: 27 mEq/L (ref 19–32)
Calcium: 9 mg/dL (ref 8.4–10.5)
Creatinine, Ser: 0.99 mg/dL (ref 0.40–1.20)
GFR: 56.85 mL/min — AB (ref >60.00)
Glucose, Bld: 89 mg/dL (ref 70–99)
POTASSIUM: 4.6 meq/L (ref 3.5–5.1)
SODIUM: 127 meq/L — AB (ref 135–145)

## 2016-07-02 LAB — HEPATIC FUNCTION PANEL
ALK PHOS: 43 U/L (ref 39–117)
ALT: 11 U/L (ref 0–35)
AST: 24 U/L (ref 0–37)
Albumin: 4 g/dL (ref 3.5–5.2)
BILIRUBIN DIRECT: 0.1 mg/dL (ref 0.0–0.3)
BILIRUBIN TOTAL: 0.4 mg/dL (ref 0.2–1.2)
Total Protein: 6.6 g/dL (ref 6.0–8.3)

## 2016-07-02 LAB — LIPID PANEL
CHOL/HDL RATIO: 3
Cholesterol: 177 mg/dL (ref 0–200)
HDL: 66.6 mg/dL (ref >39.00)
LDL CALC: 87 mg/dL (ref 0–99)
NONHDL: 110.45
TRIGLYCERIDES: 117 mg/dL (ref 0.0–149.0)
VLDL: 23.4 mg/dL (ref 0.0–40.0)

## 2016-07-02 LAB — VITAMIN D 25 HYDROXY (VIT D DEFICIENCY, FRACTURES): VITD: 40.13 ng/mL (ref 30.00–100.00)

## 2016-07-02 LAB — TSH: TSH: 8.91 u[IU]/mL — AB (ref 0.35–4.50)

## 2016-07-02 MED ORDER — OMEPRAZOLE 20 MG PO CPDR: DELAYED_RELEASE_CAPSULE | ORAL | 6 refills | Status: DC

## 2016-07-02 MED ORDER — ESTRADIOL 0.1 MG/GM VA CREA: TOPICAL_CREAM | VAGINAL | 2 refills | Status: DC

## 2016-07-02 NOTE — Assessment & Plan Note (Signed)
Following w/ Dr Earnest Conroy- has appt upcoming

## 2016-07-02 NOTE — Progress Notes (Signed)
   Subjective:    Patient ID: [name], female    DOB: 1930-04-22, 83 y.o.   MRN: [id number]  HPI Here today for CPE.  Risk Factors: Hyperlipidemia- chronic problem, currently diet controlled Osteopenia- due for Vit D Protein-Calorie Malnutrition- pt reports she is eating well since husband passed, 'everyone keeps bringing me food'. Physical Activity: exercising regularly Fall Risk: low Depression: denies current sxs Hearing: normal to conversational tones, mildly decreased to whispered voice ADL's: independent Cognitive: normal linear thought process, memory and attention intact Home Safety: safe at home Height, Weight, BMI, Visual Acuity: see vitals, vision corrected to 20/20 w/ glasses Counseling: UTD on mammo, no need for colonoscopy.  UTD on immunizations.  Due for DEXA- has f/u w/ Spine specialist scheduled Care team reviewed and updated w/ pt Labs Ordered: See A&P Care Plan: See A&P   Reviewed medical hx, surgical hx, social hx, med list, and problem list   Review of Systems Patient reports no vision/ hearing changes, adenopathy,fever, weight change,  persistant/recurrent hoarseness , swallowing issues, chest pain, palpitations, edema, persistant/recurrent cough, hemoptysis, dyspnea (rest/exertional/paroxysmal nocturnal), gastrointestinal bleeding (melena, rectal bleeding), abdominal pain, significant heartburn, bowel changes, GU symptoms (dysuria, hematuria, incontinence), Gyn symptoms (abnormal  bleeding, pain),  syncope, focal weakness, memory loss, numbness & tingling, skin/hair/nail changes, abnormal bruising or bleeding, anxiety, or depression.     Objective:   Physical Exam General Appearance:    Alert, cooperative, no distress, appears stated age  Head:    Normocephalic, without obvious abnormality, atraumatic  Eyes:    PERRL, conjunctiva/corneas clear, EOM's intact, fundi    benign, both eyes  Ears:    Normal TM's and external ear canals, both ears  Nose:    Nares normal, septum midline, mucosa normal, no drainage    or sinus tenderness  Throat:   Lips, mucosa, and tongue normal; teeth and gums normal  Neck:   Supple, symmetrical, trachea midline, no adenopathy;    Thyroid: no enlargement/tenderness/nodules  Back:     Scoliosis  Lungs:     Clear to auscultation bilaterally, respirations unlabored  Chest Wall:    No tenderness or deformity   Heart:    Regular rate and rhythm, S1 and S2 normal, no murmur, rub   or gallop  Breast Exam:    Deferred to mammo  Abdomen:     Soft, non-tender, bowel sounds active all four quadrants,    no masses, no organomegaly  Genitalia:    Deferred  Rectal:    Extremities:   Extremities normal, atraumatic, no cyanosis or edema  Pulses:   2+ and symmetric all extremities  Skin:   Skin color, texture, turgor normal, no rashes or lesions  Lymph nodes:   Cervical, supraclavicular, and axillary nodes normal  Neurologic:   CNII-XII intact, normal strength, sensation and reflexes    throughout          Assessment & Plan:

## 2016-07-02 NOTE — Assessment & Plan Note (Signed)
Pt's PE unchanged from previous and WNL w/ exception of protein-calorie malnutrition.  UTD on mammo- no need for colonoscopy.  UTD on immunizations.  Written screening schedule updated and given to pt.  Check labs.  Anticipatory guidance provided.

## 2016-07-02 NOTE — Assessment & Plan Note (Signed)
Ongoing issue for pt.  Stressed need for her to eat regularly.  Will follow.

## 2016-07-02 NOTE — Assessment & Plan Note (Signed)
Chronic problem.  Check Vit D level and replete prn. 

## 2016-07-02 NOTE — Progress Notes (Signed)
Pre visit review using our clinic review tool, if applicable. No additional management support is needed unless otherwise documented below in the visit note. 

## 2016-07-02 NOTE — Patient Instructions (Signed)
Follow up in 3 months to recheck mood/stress level We'll notify you of your lab results and make any changes if needed Keep up the good work- you look great! Continue to eat regularly- this is very important! You are up to date on mammogram and immunizations- yay! Please ask Dr Maia Petties about a bone density test Call with any questions or concerns Hang in there!  You are so strong!!!

## 2016-07-02 NOTE — Assessment & Plan Note (Signed)
Ongoing issue.  Pt is attempting to control w/ diet and exercise.  Check labs.  Start meds prn.

## 2016-07-03 ENCOUNTER — Encounter: Payer: Self-pay | Admitting: Neurology

## 2016-07-03 ENCOUNTER — Ambulatory Visit (INDEPENDENT_AMBULATORY_CARE_PROVIDER_SITE_OTHER): Payer: Medicare Other | Admitting: Neurology

## 2016-07-03 ENCOUNTER — Other Ambulatory Visit: Payer: Self-pay | Admitting: General Practice

## 2016-07-03 ENCOUNTER — Other Ambulatory Visit: Payer: Self-pay | Admitting: Family Medicine

## 2016-07-03 VITALS — BP 140/80 | HR 78 | Ht 62.0 in | Wt 106.0 lb

## 2016-07-03 DIAGNOSIS — R7989 Other specified abnormal findings of blood chemistry: Secondary | ICD-10-CM

## 2016-07-03 DIAGNOSIS — Z79899 Other long term (current) drug therapy: Secondary | ICD-10-CM

## 2016-07-03 DIAGNOSIS — G609 Hereditary and idiopathic neuropathy, unspecified: Secondary | ICD-10-CM | POA: Diagnosis not present

## 2016-07-03 DIAGNOSIS — I7381 Erythromelalgia: Secondary | ICD-10-CM

## 2016-07-03 MED ORDER — LEVOTHYROXINE SODIUM 75 MCG PO TABS: 75.0000 ug | ORAL_TABLET | Freq: Every day | ORAL | 3 refills | Status: DC

## 2016-07-03 NOTE — Patient Instructions (Addendum)
1.  We will order an EKG in case we need to start you on any new medication for your neuropathy. We have you scheduled for your EKG on 07/08/16 at 1:00 pm. Please arrive 15 minutes prior and go to [facility] main entrance. If this is not a good date/time please call [phone] to reschedule.  2.  Continue your gabapentin as you are taking 3.  Continue aspercream and lidocaine ointment as needed 4.  If your pain gets worse, please call my office   Return to clinic in 4 months

## 2016-07-03 NOTE — Progress Notes (Signed)
Follow-up Visit   Date: 07/03/16  [name] MRN: [id number] DOB: 17-Aug-1930   Interim History: [name] is a 83 y.o. right-handed Caucasian female with rheumatoid arthritis, GERD, lung cancer s/p resection (2015) returning to the clinic for follow-up of neuropathy.  The patient was accompanied to the clinic by self.   History of present illness: For the past several years, she has developed burning sensation of the lower legs and feet and redness. There is increased warmth of the feet.  She recalls having severe pain with wearing socks and anything that touches her feet. It is much worse with laying supine.  She occasionally also has burning pain, numbness, and tingling involving the hands. In July 2017, she began having increased swelling and was treated for possible cellulitis.  She was previously on plaquenil due to early signs of macular degeneration.  She did not tolerate methotrexate.  Her initial autoimmune manifestations was when she was in college and developed severe discoloration of the hands, ultimately diagnosed with Raynaud's disease.  Because of the severity of her disease, she underwent thoracic sympathectomy and developed iatrogenic left Horner's syndrome.    She was seeing Dr. Baltazar Najjar at [facility] who diagnosed her with neuropathy and started her on gabapentin '300mg'$  twice daily.  UPDATE 12/17/2015:   She was diagnosed with erytheomelalgia by her rheumatologist, but unfortunately, there is no therapy for this. She is trying to avoid alcohol, salt, and charcoaled foods which can aggravate her symptoms.  She continues to have burning pain of the feet and takes gabapentin '300mg'$  TID.  She suffered a mechanical fall several weeks ago.  She has been noncompliant with using her cane.  No new complaints.  UPDATE 07/03/2016:  She is here for follow-up visit. She rescheduled her previous follow-up appointment because her husband passed away. She has been  coping well and staying very busy managing his affairs. With respect to her neuropathy, she continues to have burning and tingling pain which is worse at night. She did appreciate some improvement with increasing her gabapentin to '1200mg'$ /day.  She also tried some of her husbands lidocaine ointment, which alleviates the pain as needed. She has not suffered any falls, however is noncompliant with using a cane.   Medications:  Current Outpatient Prescriptions on File Prior to Visit  Medication Sig Dispense Refill  . albuterol (PROVENTIL HFA;VENTOLIN HFA) 108 (90 Base) MCG/ACT inhaler Inhale 2 puffs into the lungs every 6 (six) hours as needed for wheezing or shortness of breath. 1 Inhaler 0  . Biotin 1000 MCG tablet Take 1,000 mcg by mouth daily.     . Calcium Carbonate-Vitamin D (CALCIUM 500 + D) 500-125 MG-UNIT TABS Take 1 tablet by mouth daily.     . diclofenac sodium (VOLTAREN) 1 % GEL APPLY 2-4 GRAMS UP TO 4 TIMES A DAY AS NEEDED FOR PAIN  2  . docusate sodium (STOOL SOFTENER) 100 MG capsule Take 100 mg by mouth 2 (two) times daily.      Marland Kitchen estradiol (ESTRACE VAGINAL) 0.1 MG/GM vaginal cream USE AS DIRECTED 42.5 g 2  . gabapentin (NEURONTIN) 300 MG capsule Take '300mg'$  in the morning, '300mg'$  in the afternoon, and '600mg'$  at bedtime. 120 capsule 5  . Glucosamine-Chondroit-Vit C-Mn (GLUCOSAMINE CHONDR 1500 COMPLX PO) Take 1 capsule by mouth daily.     Marland Kitchen HYDROcodone-acetaminophen (NORCO/VICODIN) 5-325 MG tablet Take 1 tablet by mouth every 12 (twelve) hours as needed. for pain  0  . meloxicam (MOBIC) 7.5 MG  tablet Take 7.5 mg by mouth 2 (two) times daily as needed for pain.    . Multiple Vitamin (MULTIVITAMIN) tablet Take 1 tablet by mouth daily.      Marland Kitchen omeprazole (PRILOSEC) 20 MG capsule TAKE 1 CAPSULE (20 MG TOTAL) BY MOUTH 2 (TWO) TIMES DAILY BEFORE A MEAL. 60 capsule 6  . spironolactone (ALDACTONE) 25 MG tablet Take 12.5 mg by mouth daily.   2   No current facility-administered medications on file  prior to visit.     Allergies:  Allergies  Allergen Reactions  . Amlodipine Rash  . Prochlorperazine Edisylate Anaphylaxis    Compazine  . Aspirin     REACTION: nose bleeds    Review of Systems:  CONSTITUTIONAL: No fevers, chills, night sweats, or weight loss.  EYES: No visual changes or eye pain ENT: No hearing changes.  No history of nose bleeds.   RESPIRATORY: No cough, wheezing and shortness of breath.   CARDIOVASCULAR: Negative for chest pain, and palpitations.   GI: Negative for abdominal discomfort, blood in stools or black stools.  No recent change in bowel habits.   GU:  No history of incontinence.   MUSCLOSKELETAL: +history of joint pain or swelling.  No myalgias.   SKIN: Negative for lesions, rash, and itching.   ENDOCRINE: Negative for cold or heat intolerance, polydipsia or goiter.   PSYCH:  No depression or anxiety symptoms.   NEURO: As Above.   Vital Signs:  BP 140/80   Pulse 78   Ht '5\' 2"'$  (1.575 m)   Wt 106 lb (48.1 kg)   SpO2 94%   BMI 19.39 kg/m   Neurological Exam: MENTAL STATUS including orientation to time, place, person, recent and remote memory, attention span and concentration, language, and fund of knowledge is normal.  Speech is not dysarthric.  CRANIAL NERVES:  Left Horner's syndrome with ipsilateral ptosis (moderate, old), miosis. Right pupil is round and reactive to light. Pupils equal round and reactive to light.  Normal conjugate, extra-ocular eye movements in all directions of gaze.  No ptosis. Normal facial sensation.  Face is symmetric. Palate elevates symmetrically.  Tongue is midline.  MOTOR:  Motor strength is 5/5 in all extremities, except toe flexors are 3/5 bilaterally.  Generalized loss of muscle bulk. No pronator drift.  Tone is normal.    SENSORY:  Vibration is absent distal to ankles bilaterally.  COORDINATION/GAIT:  Stooped posture, gait appears narrow and mildly unsteady, unassisted.  Data: Labs 2016:  SPEP with IFE neg,  ANA positive, SSA/B neg, CCP neg  MRI brain wwo contrast 08/02/2015: No acute or metastatic intracranial abnormality.  Normal for age MRI appearance of the brain.  NCS/EMG of the legs 10/23/2015: 1. The electrodiagnostic testing is most consistent with a distal and symmetric sensorimotor polyneuropathy, axon loss in type, affecting the lower extremities. 2. There is no evidence of a lumbosacral radiculopathy.  IMPRESSION/PLAN: 1.  Neuropathy associated with autoimmune disorder (Sjogren's disease), clinically stable but still with breakthrough pain at nighttime  - Continue gabapentin to  '300mg'$  in the morning and in the afternoon, and '600mg'$  at bedtime.  With her CKD, would not taper this any higher  - Discussed starting Cymbalta, but she has tried this in the past and did not tolerate it  - Continue to use Aspercreme on lidocaine ointment as needed.  - TCA is another option, but will need to get baseline EKG for her QTc interval  - At this time, she does not wish to start  any new medication  - Fall precautions discussed and strongly urged her to use a cane  2.  Erythromelalgia, followed by rheumatology  Return to clinic in 4 months  The duration of this appointment visit was 30 minutes of face-to-face time with the patient.  Greater than 50% of this time was spent in counseling, explanation of diagnosis, planning of further management, and coordination of care.   Thank you for allowing me to participate in patient's care.  If I can answer any additional questions, I would be pleased to do so.    Sincerely,    Theotis Gerdeman K. Posey Pronto, DO

## 2016-07-04 ENCOUNTER — Telehealth: Payer: Self-pay | Admitting: Family Medicine

## 2016-07-04 NOTE — Telephone Encounter (Signed)
Patient notified of PCP recommendations and is agreement and expresses an understanding.  

## 2016-07-04 NOTE — Telephone Encounter (Signed)
Pt states that she has an upcoming appt to have an EKG at [place], pt asking if there is a way that she could have this done at the [facility] in [place]. That this would be easier than going to [facility].

## 2016-07-04 NOTE — Telephone Encounter (Signed)
It looks as though Dr. Posey Pronto ordered it

## 2016-07-04 NOTE — Telephone Encounter (Signed)
I'm guessing that another office would not want to take responsibility for an EKG- in case it was abnormal.  She likely needs to go to [name] for this

## 2016-07-04 NOTE — Telephone Encounter (Signed)
I am not sure who ordered this or why- and I'm not sure if the [facility] would be able to do this for her (I don't know how that works)

## 2016-07-06 ENCOUNTER — Other Ambulatory Visit: Payer: Self-pay | Admitting: Neurology

## 2016-07-08 ENCOUNTER — Ambulatory Visit (HOSPITAL_COMMUNITY)
Admission: RE | Admit: 2016-07-08 | Discharge: 2016-07-08 | Disposition: A | Discharge status: Home / Self Care | Payer: Medicare Other | Source: Ambulatory Visit | Attending: Neurology | Admitting: Neurology

## 2016-07-08 DIAGNOSIS — I447 Left bundle-branch block, unspecified: Secondary | ICD-10-CM | POA: Insufficient documentation

## 2016-07-08 DIAGNOSIS — I517 Cardiomegaly: Secondary | ICD-10-CM | POA: Insufficient documentation

## 2016-07-08 DIAGNOSIS — Z79899 Other long term (current) drug therapy: Secondary | ICD-10-CM | POA: Diagnosis present

## 2016-07-10 ENCOUNTER — Other Ambulatory Visit: Payer: Medicare Other

## 2016-07-11 ENCOUNTER — Other Ambulatory Visit (INDEPENDENT_AMBULATORY_CARE_PROVIDER_SITE_OTHER): Payer: Medicare Other

## 2016-07-11 ENCOUNTER — Other Ambulatory Visit: Payer: Self-pay | Admitting: General Practice

## 2016-07-11 DIAGNOSIS — R7989 Other specified abnormal findings of blood chemistry: Secondary | ICD-10-CM

## 2016-07-11 DIAGNOSIS — E0590 Thyrotoxicosis, unspecified without thyrotoxic crisis or storm: Secondary | ICD-10-CM

## 2016-07-11 DIAGNOSIS — R946 Abnormal results of thyroid function studies: Secondary | ICD-10-CM

## 2016-07-11 LAB — BASIC METABOLIC PANEL WITH GFR
BUN: 25 mg/dL — ABNORMAL HIGH (ref 6–23)
CO2: 28 meq/L (ref 19–32)
Calcium: 9.5 mg/dL (ref 8.4–10.5)
Chloride: 96 meq/L (ref 96–112)
Creatinine, Ser: 1.17 mg/dL (ref 0.40–1.20)
GFR: 46.88 mL/min — ABNORMAL LOW
Glucose, Bld: 66 mg/dL — ABNORMAL LOW (ref 70–99)
Potassium: 4.4 meq/L (ref 3.5–5.1)
Sodium: 130 meq/L — ABNORMAL LOW (ref 135–145)

## 2016-07-11 LAB — TSH: TSH: 6.3 u[IU]/mL — ABNORMAL HIGH (ref 0.35–4.50)

## 2016-07-11 MED ORDER — LEVOTHYROXINE SODIUM 88 MCG PO TABS: 88.0000 ug | ORAL_TABLET | Freq: Every day | ORAL | 3 refills | Status: DC

## 2016-07-28 ENCOUNTER — Telehealth: Payer: Self-pay | Admitting: Family Medicine

## 2016-07-28 ENCOUNTER — Ambulatory Visit (INDEPENDENT_AMBULATORY_CARE_PROVIDER_SITE_OTHER): Payer: Medicare Other | Admitting: Family Medicine

## 2016-07-28 ENCOUNTER — Encounter: Payer: Self-pay | Admitting: Family Medicine

## 2016-07-28 ENCOUNTER — Other Ambulatory Visit: Payer: Self-pay | Admitting: Family Medicine

## 2016-07-28 VITALS — BP 122/70 | HR 75 | Temp 97.7°F | Resp 14 | Ht 62.0 in | Wt 106.0 lb

## 2016-07-28 DIAGNOSIS — R112 Nausea with vomiting, unspecified: Secondary | ICD-10-CM

## 2016-07-28 DIAGNOSIS — R399 Unspecified symptoms and signs involving the genitourinary system: Secondary | ICD-10-CM

## 2016-07-28 LAB — POCT URINALYSIS DIPSTICK
Bilirubin, UA: NEGATIVE
Blood, UA: NEGATIVE
Glucose, UA: NEGATIVE
Ketones, UA: NEGATIVE
LEUKOCYTES UA: NEGATIVE
Nitrite, UA: NEGATIVE
PROTEIN UA: 0.15
SPEC GRAV UA: 1.02 (ref 1.010–1.025)
UROBILINOGEN UA: 0.2 U/dL
pH, UA: 5 (ref 5.0–8.0)

## 2016-07-28 LAB — HEPATIC FUNCTION PANEL
ALBUMIN: 4.3 g/dL (ref 3.5–5.2)
ALT: 11 U/L (ref 0–35)
AST: 22 U/L (ref 0–37)
Alkaline Phosphatase: 48 U/L (ref 39–117)
Bilirubin, Direct: 0.1 mg/dL (ref 0.0–0.3)
TOTAL PROTEIN: 6.9 g/dL (ref 6.0–8.3)
Total Bilirubin: 0.3 mg/dL (ref 0.2–1.2)

## 2016-07-28 LAB — CBC WITH DIFFERENTIAL/PLATELET
BASOS ABS: 0 10*3/uL (ref 0.0–0.1)
Basophils Relative: 0.5 % (ref 0.0–3.0)
EOS ABS: 0 10*3/uL (ref 0.0–0.7)
Eosinophils Relative: 0.6 % (ref 0.0–5.0)
HCT: 30.4 % — ABNORMAL LOW (ref 36.0–46.0)
HEMOGLOBIN: 10.4 g/dL — AB (ref 12.0–15.0)
Lymphocytes Relative: 21.1 % (ref 12.0–46.0)
Lymphs Abs: 0.9 10*3/uL (ref 0.7–4.0)
MCHC: 34.2 g/dL (ref 30.0–36.0)
MCV: 87.7 fl (ref 78.0–100.0)
MONO ABS: 0.5 10*3/uL (ref 0.1–1.0)
Monocytes Relative: 10.7 % (ref 3.0–12.0)
Neutro Abs: 2.9 10*3/uL (ref 1.4–7.7)
Neutrophils Relative %: 67.1 % (ref 43.0–77.0)
Platelets: 171 10*3/uL (ref 150.0–400.0)
RBC: 3.46 Mil/uL — AB (ref 3.87–5.11)
RDW: 12.9 % (ref 11.5–15.5)
WBC: 4.3 10*3/uL (ref 4.0–10.5)

## 2016-07-28 LAB — BASIC METABOLIC PANEL
BUN: 27 mg/dL — AB (ref 6–23)
CALCIUM: 9.5 mg/dL (ref 8.4–10.5)
CHLORIDE: 95 meq/L — AB (ref 96–112)
CO2: 27 meq/L (ref 19–32)
CREATININE: 1.07 mg/dL (ref 0.40–1.20)
GFR: 51.96 mL/min — ABNORMAL LOW (ref >60.00)
GLUCOSE: 121 mg/dL — AB (ref 70–99)
Potassium: 4.4 mEq/L (ref 3.5–5.1)
Sodium: 128 mEq/L — ABNORMAL LOW (ref 135–145)

## 2016-07-28 LAB — LIPASE: LIPASE: 48 U/L (ref 11.0–59.0)

## 2016-07-28 LAB — TSH: TSH: 2.16 u[IU]/mL (ref 0.35–4.50)

## 2016-07-28 MED ORDER — ONDANSETRON 4 MG PO TBDP: 4.0000 mg | ORAL_TABLET | Freq: Three times a day (TID) | ORAL | 0 refills | Status: DC | PRN

## 2016-07-28 NOTE — Patient Instructions (Signed)
Follow up as needed/scheduled We'll notify you of your lab results and make any changes if needed Use the Zofran as needed for nausea Drink plenty of fluids Your urine does not look infected- yay! Call with any questions or concerns- particularly if symptoms worsen Hang in there!!!

## 2016-07-28 NOTE — Telephone Encounter (Signed)
Ok to have UA done for UTI sxs

## 2016-07-28 NOTE — Telephone Encounter (Signed)
Pt states that she is coming in today for labs and asking if she could leave a urine sample due to possible uti.

## 2016-07-28 NOTE — Progress Notes (Signed)
   Subjective:    Patient ID: [name], female    DOB: [birth year], 83 y.o.   MRN: [id number]  HPI Nausea- pt reports it will come in waves, 'it just comes over me'.  Pt was vomiting last night.  She woke at 12:15am and was 'shaking and trembling'.  Had some sweating of palms.  Got up to take BP and was dizzy- 158/89.  Went back to bed and again felt like she was going to be sick.  Daughter came to stay with her.  Daughter felt pt was dehydrated and encouraged her to increase fluid intake.  Woke this AM and BP was normal.  Pt ate Pimento [employer] sandwich for lunch and again became nauseated.  Pt's thyroid medication was changed 5/25.  Pt reports mild LLQ discomfort for a few days- dull ache.  sxs all started 2-3 days ago.   Review of Systems For ROS see HPI     Objective:   Physical Exam  Constitutional: She is oriented to person, place, and time. She appears well-developed and well-nourished. No distress.  HENT:  Head: Normocephalic and atraumatic.  MMM  Neck: Neck supple.  Cardiovascular: Normal rate, regular rhythm and intact distal pulses.   Pulmonary/Chest: Effort normal and breath sounds normal. No respiratory distress. She has no wheezes. She has no rales.  Abdominal: Soft. She exhibits no distension. There is no tenderness (no TTP over abdomen, even when pushing quite hard over LLQ). There is no rebound.  Hyperactive BS  Lymphadenopathy:    She has no cervical adenopathy.  Neurological: She is alert and oriented to person, place, and time.  Skin: Skin is warm and dry.  Vitals reviewed.         Assessment & Plan:  Nausea/vomiting- pt has had 2-3 days of sxs.  No evidence of UTI today (UA WNL).  No evidence of diverticulitis as pt is not at all TTP over abdomen.  Vitals are stable.  Pt is not in any distress today.  She does report feeling shaky which may have to do w/ her recent levothyroxine adjustment.  Check labs.  If no lab abnormalities, suspect she has a viral  illness.  Reviewed supportive care and red flags that should prompt return.  Pt expressed understanding and is in agreement w/ plan.

## 2016-07-28 NOTE — Progress Notes (Signed)
Pre visit review using our clinic review tool, if applicable. No additional management support is needed unless otherwise documented below in the visit note. 

## 2016-07-28 NOTE — Telephone Encounter (Signed)
Please advise 

## 2016-07-28 NOTE — Telephone Encounter (Signed)
Called and spoke with pt, scheduled appt with Dr. Birdie Riddle for today at 2pm due to dizziness, abd pain, and nausea.

## 2016-08-06 ENCOUNTER — Other Ambulatory Visit: Payer: Medicare Other

## 2016-08-07 ENCOUNTER — Other Ambulatory Visit: Payer: Self-pay | Admitting: General Practice

## 2016-08-07 MED ORDER — LEVOTHYROXINE SODIUM 88 MCG PO TABS: 88.0000 ug | ORAL_TABLET | Freq: Every day | ORAL | 0 refills | Status: DC

## 2016-08-21 ENCOUNTER — Other Ambulatory Visit (HOSPITAL_BASED_OUTPATIENT_CLINIC_OR_DEPARTMENT_OTHER): Payer: Medicare Other

## 2016-08-21 ENCOUNTER — Ambulatory Visit (HOSPITAL_COMMUNITY)
Admission: RE | Admit: 2016-08-21 | Discharge: 2016-08-21 | Disposition: A | Discharge status: Home / Self Care | Payer: Medicare Other | Source: Ambulatory Visit | Attending: Internal Medicine | Admitting: Internal Medicine

## 2016-08-21 DIAGNOSIS — K449 Diaphragmatic hernia without obstruction or gangrene: Secondary | ICD-10-CM | POA: Diagnosis not present

## 2016-08-21 DIAGNOSIS — C3411 Malignant neoplasm of upper lobe, right bronchus or lung: Secondary | ICD-10-CM | POA: Diagnosis not present

## 2016-08-21 DIAGNOSIS — E0590 Thyrotoxicosis, unspecified without thyrotoxic crisis or storm: Secondary | ICD-10-CM

## 2016-08-21 DIAGNOSIS — C342 Malignant neoplasm of middle lobe, bronchus or lung: Secondary | ICD-10-CM

## 2016-08-21 LAB — CBC WITH DIFFERENTIAL/PLATELET
BASO%: 0.5 % (ref 0.0–2.0)
BASOS ABS: 0 10*3/uL (ref 0.0–0.1)
EOS ABS: 0 10*3/uL (ref 0.0–0.5)
EOS%: 0.7 % (ref 0.0–7.0)
HCT: 33.6 % — ABNORMAL LOW (ref 34.8–46.6)
HGB: 11 g/dL — ABNORMAL LOW (ref 11.6–15.9)
LYMPH%: 25.5 % (ref 14.0–49.7)
MCH: 29.1 pg (ref 25.1–34.0)
MCHC: 32.7 g/dL (ref 31.5–36.0)
MCV: 88.9 fL (ref 79.5–101.0)
MONO#: 0.5 10*3/uL (ref 0.1–0.9)
MONO%: 11.9 % (ref 0.0–14.0)
NEUT#: 2.5 10*3/uL (ref 1.5–6.5)
NEUT%: 61.4 % (ref 38.4–76.8)
Platelets: 160 10*3/uL (ref 145–400)
RBC: 3.78 10*6/uL (ref 3.70–5.45)
RDW: 13 % (ref 11.2–14.5)
WBC: 4 10*3/uL (ref 3.9–10.3)
lymph#: 1 10*3/uL (ref 0.9–3.3)

## 2016-08-21 LAB — COMPREHENSIVE METABOLIC PANEL
ALT: 13 U/L (ref 0–55)
AST: 27 U/L (ref 5–34)
Albumin: 4 g/dL (ref 3.5–5.0)
Alkaline Phosphatase: 54 U/L (ref 40–150)
Anion Gap: 8 mEq/L (ref 3–11)
BUN: 25.6 mg/dL (ref 7.0–26.0)
CHLORIDE: 97 meq/L — AB (ref 98–109)
CO2: 26 meq/L (ref 22–29)
Calcium: 10 mg/dL (ref 8.4–10.4)
Creatinine: 1.2 mg/dL — ABNORMAL HIGH (ref 0.6–1.1)
EGFR: 42 mL/min/{1.73_m2} — AB (ref >90)
Glucose: 82 mg/dl (ref 70–140)
POTASSIUM: 4.8 meq/L (ref 3.5–5.1)
Sodium: 130 mEq/L — ABNORMAL LOW (ref 136–145)
Total Bilirubin: 0.31 mg/dL (ref 0.20–1.20)
Total Protein: 7.4 g/dL (ref 6.4–8.3)

## 2016-08-21 LAB — TSH: TSH: 1.68 m[IU]/L (ref 0.308–3.960)

## 2016-08-21 MED ORDER — IOPAMIDOL (ISOVUE-300) INJECTION 61%
75.0000 mL | Freq: Once | INTRAVENOUS | Status: AC | PRN
  Administered 2016-08-21: 50 mL via INTRAVENOUS

## 2016-08-21 MED ORDER — IOPAMIDOL (ISOVUE-300) INJECTION 61%
INTRAVENOUS | Status: AC
  Filled 2016-08-21: qty 75

## 2016-08-26 ENCOUNTER — Telehealth: Payer: Self-pay | Admitting: Internal Medicine

## 2016-08-26 ENCOUNTER — Encounter: Payer: Self-pay | Admitting: Internal Medicine

## 2016-08-26 ENCOUNTER — Ambulatory Visit (HOSPITAL_BASED_OUTPATIENT_CLINIC_OR_DEPARTMENT_OTHER): Payer: Medicare Other | Admitting: Internal Medicine

## 2016-08-26 VITALS — BP 143/77 | HR 78 | Temp 98.0°F | Resp 17 | Ht 62.0 in | Wt 105.5 lb

## 2016-08-26 DIAGNOSIS — C3411 Malignant neoplasm of upper lobe, right bronchus or lung: Secondary | ICD-10-CM | POA: Diagnosis not present

## 2016-08-26 NOTE — Telephone Encounter (Signed)
Scheduled appt per 7/10 los - lab and f/u in one year - reminder letter sent in the mail.

## 2016-08-26 NOTE — Progress Notes (Signed)
[facility] Telephone:([phone]   Fax:([phone]  OFFICE PROGRESS NOTE  Midge Minium, MD [address] [place] [address]  DIAGNOSIS: Stage IIA (T2b, N0, M0) non-small cell lung cancer, adenocarcinoma with positive EGFR mutation in exon 21 ([id number]) presented with right upper lobe lung mass diagnosed in March of 2015.  PRIOR THERAPY: Status post right upper lobectomy with wedge resection of the right middle lobe under the care of Dr. Servando Snare on 06/07/2013  CURRENT THERAPY: Observation.  INTERVAL HISTORY: [name] 83 y.o. female returns to the clinic today for six-month follow-up visit. The patient is feeling fine today with no specific complaints except for back pain from scoliosis and she receives steroid injection by her orthopedic surgeon. She lost her husband 2 months ago. The patient denied having any chest pain, shortness breath, cough or hemoptysis. She denied having any fever or chills. She has no nausea, vomiting, diarrhea or constipation. She had repeat CT scan of the chest performed recently and she is here for evaluation and discussion of her scan results.  MEDICAL HISTORY: Past Medical History:  Diagnosis Date  . Abnormal ECG 11/2008   left bbb  . Arthritis   . Back pain    scoliosis  . Cancer ([place]) 2015   lung  . Colon polyps   . Constipation    takes Colace daily  . Dry eyes    eye drops used   . GERD (gastroesophageal reflux disease)    takes Omeprazole daily  . Iron deficiency anemia   . Lupus    takes Plaquenil daily  . Osteoporosis   . PONV (postoperative nausea and vomiting)    likes phenergan  . Raynaud's phenomenon 1965  . Rheumatoid arthritis ([place])   . Sciatica   . Sjogren's syndrome ([place])     ALLERGIES:  is allergic to amlodipine; prochlorperazine edisylate; and aspirin.  MEDICATIONS:  Current Outpatient Prescriptions  Medication Sig Dispense Refill  . albuterol (PROVENTIL HFA;VENTOLIN HFA)  108 (90 Base) MCG/ACT inhaler Inhale 2 puffs into the lungs every 6 (six) hours as needed for wheezing or shortness of breath. 1 Inhaler 0  . Biotin 1000 MCG tablet Take 1,000 mcg by mouth daily.     . Calcium Carbonate-Vitamin D (CALCIUM 500 + D) 500-125 MG-UNIT TABS Take 1 tablet by mouth daily.     . diclofenac sodium (VOLTAREN) 1 % GEL APPLY 2-4 GRAMS UP TO 4 TIMES A DAY AS NEEDED FOR PAIN  2  . docusate sodium (STOOL SOFTENER) 100 MG capsule Take 100 mg by mouth 2 (two) times daily.      Marland Kitchen estradiol (ESTRACE VAGINAL) 0.1 MG/GM vaginal cream USE AS DIRECTED 42.5 g 2  . gabapentin (NEURONTIN) 300 MG capsule TAKE '300MG'$  IN THE MORNING, '300MG'$  IN THE AFTERNOON, AND '600MG'$  AT BEDTIME. 120 capsule 5  . Glucosamine-Chondroit-Vit C-Mn (GLUCOSAMINE CHONDR 1500 COMPLX PO) Take 1 capsule by mouth daily.     Marland Kitchen HYDROcodone-acetaminophen (NORCO/VICODIN) 5-325 MG tablet Take 1 tablet by mouth every 12 (twelve) hours as needed. for pain  0  . levothyroxine (SYNTHROID, LEVOTHROID) 88 MCG tablet Take 1 tablet (88 mcg total) by mouth daily. 90 tablet 0  . meloxicam (MOBIC) 7.5 MG tablet Take 7.5 mg by mouth 2 (two) times daily as needed for pain.    . Multiple Vitamin (MULTIVITAMIN) tablet Take 1 tablet by mouth daily.      Marland Kitchen omeprazole (PRILOSEC) 20 MG capsule TAKE  1 CAPSULE (20 MG TOTAL) BY MOUTH 2 (TWO) TIMES DAILY BEFORE A MEAL. 60 capsule 6  . ondansetron (ZOFRAN ODT) 4 MG disintegrating tablet Take 1 tablet (4 mg total) by mouth every 8 (eight) hours as needed for nausea or vomiting. 30 tablet 0  . spironolactone (ALDACTONE) 25 MG tablet Take 12.5 mg by mouth daily.   2   No current facility-administered medications for this visit.     SURGICAL HISTORY:  Past Surgical History:  Procedure Laterality Date  . APPENDECTOMY    . CARDIOVASCULAR STRESS TEST     12/2008 mild fixed basal to mid septal perfusion defect felt likely due to artifact from LBBB, no ischemia, EF 58%  . CATARACT EXTRACTION Bilateral     . COLONOSCOPY    . LYMPH NODE DISSECTION Right 06/07/2013   Procedure: LYMPH NODE DISSECTION;  Surgeon: [name], MD;  Location: [facility];  Service: Thoracic;  Laterality: Right;  . [place]  . THORACOTOMY  06/07/2013   Procedure: MINI/LIMITED THORACOTOMY; right middle lobe wedge resection;  Surgeon: [name], MD;  Location: [facility];  Service: Thoracic;;  . TONSILLECTOMY    . TOTAL ABDOMINAL HYSTERECTOMY  1980's   . TOTAL HIP ARTHROPLASTY Right 04/28/2014   Procedure: RIGHT TOTAL HIP ARTHROPLASTY ANTERIOR APPROACH;  Surgeon: [name], MD;  Location: [facility];  Service: Orthopedics;  Laterality: Right;  Marland Kitchen VIDEO ASSISTED THORACOSCOPY (VATS)/WEDGE RESECTION Right 06/07/2013   Procedure: VIDEO ASSISTED THORACOSCOPY (VATS)/right upper lobectomy, On Q;  Surgeon: [name], MD;  Location: [facility];  Service: Thoracic;  Laterality: Right;  Marland Kitchen VIDEO BRONCHOSCOPY N/A 06/07/2013   Procedure: VIDEO BRONCHOSCOPY;  Surgeon: [name], MD;  Location: [facility];  Service: Thoracic;  Laterality: N/A;  . VIDEO BRONCHOSCOPY WITH ENDOBRONCHIAL NAVIGATION N/A 05/04/2013   Procedure: VIDEO BRONCHOSCOPY WITH ENDOBRONCHIAL NAVIGATION;  Surgeon: [name], MD;  Location: [facility];  Service: Thoracic;  Laterality: N/A;    REVIEW OF SYSTEMS:  A comprehensive review of systems was negative except for: Musculoskeletal: positive for back pain   PHYSICAL EXAMINATION: General appearance: alert, cooperative and no distress Head: Normocephalic, without obvious abnormality, atraumatic Neck: no adenopathy, no JVD, supple, symmetrical, trachea midline and thyroid not enlarged, symmetric, no tenderness/mass/nodules Lymph nodes: Cervical, supraclavicular, and axillary nodes normal. Resp: clear to auscultation bilaterally Back: symmetric, no curvature. ROM normal. No CVA tenderness. Cardio: regular rate and rhythm, S1, S2 normal, no murmur, click, rub or gallop GI: soft,  non-tender; bowel sounds normal; no masses,  no organomegaly Extremities: extremities normal, atraumatic, no cyanosis or edema  ECOG PERFORMANCE STATUS: 1 - Symptomatic but completely ambulatory  Blood pressure (!) 143/77, pulse 78, temperature 98 F (36.7 C), temperature source Oral, resp. rate 17, height '5\' 2"'$  (1.575 m), weight 105 lb 8 oz (47.9 kg), SpO2 100 %.  LABORATORY DATA: Lab Results  Component Value Date   WBC 4.0 08/21/2016   HGB 11.0 (L) 08/21/2016   HCT 33.6 (L) 08/21/2016   MCV 88.9 08/21/2016   PLT 160 08/21/2016      Chemistry      Component Value Date/Time   NA 130 (L) 08/21/2016 1125   K 4.8 08/21/2016 1125   CL 95 (L) 07/28/2016 1454   CO2 26 08/21/2016 1125   BUN 25.6 08/21/2016 1125   CREATININE 1.2 (H) 08/21/2016 1125      Component Value Date/Time   CALCIUM 10.0 08/21/2016 1125   ALKPHOS 54 08/21/2016 1125  AST 27 08/21/2016 1125   ALT 13 08/21/2016 1125   BILITOT 0.31 08/21/2016 1125       RADIOGRAPHIC STUDIES: Ct Chest W Contrast  Result Date: 08/21/2016 CLINICAL DATA:  Lung cancer EXAM: CT CHEST WITH CONTRAST TECHNIQUE: Multidetector CT imaging of the chest was performed during intravenous contrast administration. CONTRAST:  54m ISOVUE-300 IOPAMIDOL (ISOVUE-300) INJECTION 61% COMPARISON:  02/21/2016 FINDINGS: Cardiovascular: The heart size is normal.  No pericardial effusion. Mediastinum/Nodes: No mediastinal lymphadenopathy. There is no hilar lymphadenopathy. Somewhat patulous appearance esophagus is stable. Small hiatal hernia again noted. There is no axillary lymphadenopathy. Lungs/Pleura: Postsurgical change and scarring in the right apex is similar to prior. 4 mm nodular component seen image 37 series 5 is stable. Surgical changes in the right hilum and pulmonary venous anatomy is compatible with the reported clinical history of previous right upper lobectomy. 3 mm left upper lobe pulmonary nodule (image 39 series 5) is stable. No focal  airspace consolidation. No pulmonary edema or pleural effusion. Upper Abdomen: Tiny hypervascular focus right liver is unchanged. Musculoskeletal: Degenerative changes are noted in the shoulders. Bone windows reveal no worrisome lytic or sclerotic osseous lesions. IMPRESSION: 1. Stable exam.  No new or progressive findings. 2. Small hiatal hernia. Electronically Signed   By: EMisty StanleyM.D.   On: 08/21/2016 14:01   ASSESSMENT AND PLAN:  This is a very pleasant 83years old white female with stage II a non-small cell lung cancer, adenocarcinoma with positive EGFR mutation in exon 21 status post right upper lobectomy as well as wedge resection of the right middle lobe and the patient declined adjuvant systemic chemotherapy. She is currently on observation and doing fine. Her recent CT scan of the chest showed no evidence for disease recurrence. I discussed the scan results with the patient I recommended for her to continue on observation with repeat CT scan of the chest in one year. She was advised to call immediately if she has any concerning symptoms in the interval. The patient voices understanding of current disease status and treatment options and is in agreement with the current care plan. All questions were answered. The patient knows to call the clinic with any problems, questions or concerns. We can certainly see the patient much sooner if necessary. I spent 10 minutes counseling the patient face to face. The total time spent in the appointment was 15 minutes. Disclaimer: This note was dictated with voice recognition software. Similar sounding words can inadvertently be transcribed and may not be corrected upon review.

## 2016-08-28 ENCOUNTER — Encounter: Payer: Self-pay | Admitting: Family Medicine

## 2016-08-28 ENCOUNTER — Ambulatory Visit: Payer: Medicare Other | Admitting: Cardiothoracic Surgery

## 2016-09-09 ENCOUNTER — Telehealth: Payer: Self-pay | Admitting: Neurology

## 2016-09-09 NOTE — Telephone Encounter (Signed)
Caller: [name]   Urgent? No  Reason for the call: She would like a sooner appointment with Dr. Posey Pronto. She is on a wait list. She said she is having pain and burning in feet/legs. She would like to speak with you to let you know what all is going on. Please call. Thanks

## 2016-09-09 NOTE — Telephone Encounter (Signed)
Patient said that now the pain and burning is also in her hands.  She said that Dr. Posey Pronto wanted her to have an EKG before trying another medication.  Patient had the EKG and would now like to discuss the new medication.

## 2016-09-11 NOTE — Telephone Encounter (Signed)
Please inform patient that I reviewed her EKG.  We will need to be cautious starting the new medication as sometimes it can affect the heart rhythm and can start nortriptyline 10mg  at bedtime and recheck EKG in 1 month.  If she would like to discuss options in the office, please add her to my schedule on Monday 7/30 8am or 3pm.  Thanks,  Donika K. Posey Pronto, DO

## 2016-09-11 NOTE — Telephone Encounter (Signed)
Patient will come in Monday at 3pm.

## 2016-09-15 ENCOUNTER — Encounter: Payer: Self-pay | Admitting: Neurology

## 2016-09-15 ENCOUNTER — Ambulatory Visit (INDEPENDENT_AMBULATORY_CARE_PROVIDER_SITE_OTHER): Payer: Medicare Other | Admitting: Neurology

## 2016-09-15 VITALS — BP 120/80 | HR 84 | Ht 62.0 in | Wt 106.3 lb

## 2016-09-15 DIAGNOSIS — Z79899 Other long term (current) drug therapy: Secondary | ICD-10-CM

## 2016-09-15 DIAGNOSIS — I7381 Erythromelalgia: Secondary | ICD-10-CM

## 2016-09-15 DIAGNOSIS — G609 Hereditary and idiopathic neuropathy, unspecified: Secondary | ICD-10-CM

## 2016-09-15 MED ORDER — NORTRIPTYLINE HCL 10 MG PO CAPS: 10.0000 mg | ORAL_CAPSULE | Freq: Every day | ORAL | 5 refills | Status: DC

## 2016-09-15 NOTE — Progress Notes (Signed)
Follow-up Visit   Date: 09/15/16  [name] MRN: [id number] DOB: [birth year]   Interim History: [name] is a 83 y.o. right-handed Caucasian female with rheumatoid arthritis, GERD, lung cancer s/p resection (2015) returning to the clinic for follow-up of neuropathy.  The patient was accompanied to the clinic by self.   History of present illness: For the past several years, she has developed burning sensation of the lower legs and feet and redness. There is increased warmth of the feet.  She recalls having severe pain with wearing socks and anything that touches her feet. It is much worse with laying supine.  She occasionally also has burning pain, numbness, and tingling involving the hands. In July 2017, she began having increased swelling and was treated for possible cellulitis.  She was previously on plaquenil due to early signs of macular degeneration.  She did not tolerate methotrexate.  Her initial autoimmune manifestations was when she was in college and developed severe discoloration of the hands, ultimately diagnosed with Raynaud's disease.  Because of the severity of her disease, she underwent thoracic sympathectomy and developed iatrogenic left Horner's syndrome.    She was seeing Dr. Baltazar Najjar at [facility] who diagnosed her with neuropathy and started her on gabapentin 300mg  twice daily.  UPDATE 12/17/2015:   She was diagnosed with erytheomelalgia by her rheumatologist, but unfortunately, there is no therapy for this. She is trying to avoid alcohol, salt, and charcoaled foods which can aggravate her symptoms.  She continues to have burning pain of the feet and takes gabapentin 300mg  TID.  She suffered a mechanical fall several weeks ago.  She has been noncompliant with using her cane.  No new complaints.  UPDATE 07/03/2016:  She is here for follow-up visit. She rescheduled her previous follow-up appointment because her husband passed away. She has been  coping well and staying very busy managing his affairs. With respect to her neuropathy, she continues to have burning and tingling pain which is worse at night. She did appreciate some improvement with increasing her gabapentin to 1200mg /day.  She also tried some of her husbands lidocaine ointment, which alleviates the pain as needed. She has not suffered any falls, however is noncompliant with using a cane.   UPDATE 09/15/2016:  She scheduled sooner follow-up visit because of worsening burning pain of the feet. She is taking gabapentin 300mg  TID and extra 600mg  at bedtime as needed.  She also complains of burning pain of the hands.  She reports worsening redness and pain of the feet related to her erythromelalgia which is when her pain is severe. She has clearly identified foods that make her pain worse.  She is getting [facility] tomorrow.    Medications:  Current Outpatient Prescriptions on File Prior to Visit  Medication Sig Dispense Refill  . albuterol (PROVENTIL HFA;VENTOLIN HFA) 108 (90 Base) MCG/ACT inhaler Inhale 2 puffs into the lungs every 6 (six) hours as needed for wheezing or shortness of breath. 1 Inhaler 0  . Biotin 1000 MCG tablet Take 1,000 mcg by mouth daily.     . Calcium Carbonate-Vitamin D (CALCIUM 500 + D) 500-125 MG-UNIT TABS Take 1 tablet by mouth daily.     . diclofenac sodium (VOLTAREN) 1 % GEL APPLY 2-4 GRAMS UP TO 4 TIMES A DAY AS NEEDED FOR PAIN  2  . docusate sodium (STOOL SOFTENER) 100 MG capsule Take 100 mg by mouth 2 (two) times daily.      Marland Kitchen  estradiol (ESTRACE VAGINAL) 0.1 MG/GM vaginal cream USE AS DIRECTED 42.5 g 2  . gabapentin (NEURONTIN) 300 MG capsule TAKE 300MG  IN THE MORNING, 300MG  IN THE AFTERNOON, AND 600MG  AT BEDTIME. 120 capsule 5  . Glucosamine-Chondroit-Vit C-Mn (GLUCOSAMINE CHONDR 1500 COMPLX PO) Take 1 capsule by mouth daily.     Marland Kitchen HYDROcodone-acetaminophen (NORCO/VICODIN) 5-325 MG tablet Take 1 tablet by mouth every 12 (twelve)  hours as needed. for pain  0  . levothyroxine (SYNTHROID, LEVOTHROID) 88 MCG tablet Take 1 tablet (88 mcg total) by mouth daily. 90 tablet 0  . meloxicam (MOBIC) 7.5 MG tablet Take 7.5 mg by mouth 2 (two) times daily as needed for pain.    . Multiple Vitamin (MULTIVITAMIN) tablet Take 1 tablet by mouth daily.      Marland Kitchen omeprazole (PRILOSEC) 20 MG capsule TAKE 1 CAPSULE (20 MG TOTAL) BY MOUTH 2 (TWO) TIMES DAILY BEFORE A MEAL. 60 capsule 6  . ondansetron (ZOFRAN ODT) 4 MG disintegrating tablet Take 1 tablet (4 mg total) by mouth every 8 (eight) hours as needed for nausea or vomiting. 30 tablet 0  . spironolactone (ALDACTONE) 25 MG tablet Take 12.5 mg by mouth daily.   2   No current facility-administered medications on file prior to visit.     Allergies:  Allergies  Allergen Reactions  . Amlodipine Rash  . Prochlorperazine Edisylate Anaphylaxis    Compazine  . Aspirin     REACTION: nose bleeds    Review of Systems:  CONSTITUTIONAL: No fevers, chills, night sweats, or weight loss.  EYES: No visual changes or eye pain ENT: No hearing changes.  No history of nose bleeds.   RESPIRATORY: No cough, wheezing and shortness of breath.   CARDIOVASCULAR: Negative for chest pain, and palpitations.   GI: Negative for abdominal discomfort, blood in stools or black stools.  No recent change in bowel habits.   GU:  No history of incontinence.   MUSCLOSKELETAL: +history of joint pain or swelling.  No myalgias.   SKIN: Negative for lesions, rash, and itching.   ENDOCRINE: Negative for cold or heat intolerance, polydipsia or goiter.   PSYCH:  No depression or anxiety symptoms.   NEURO: As Above.   Vital Signs:  BP 120/80   Pulse 84   Ht 5\' 2"  (1.575 m)   Wt 106 lb 5 oz (48.2 kg)   SpO2 98%   BMI 19.44 kg/m   Neurological Exam: MENTAL STATUS including orientation to time, place, person, recent and remote memory, attention span and concentration, language, and fund of knowledge is normal.   Speech is not dysarthric.  CRANIAL NERVES:  Left Horner's syndrome with ipsilateral ptosis (moderate, old), miosis. Right pupil is round and reactive to light.  Normal conjugate, extra-ocular eye movements in all directions of gaze.    COORDINATION/GAIT:  Stooped posture, gait appears narrow and mildly unsteady, unassisted.  Data: Labs 2016:  SPEP with IFE neg, ANA positive, SSA/B neg, CCP neg  MRI brain wwo contrast 08/02/2015: No acute or metastatic intracranial abnormality.  Normal for age MRI appearance of the brain.  NCS/EMG of the legs 10/23/2015: 1. The electrodiagnostic testing is most consistent with a distal and symmetric sensorimotor polyneuropathy, axon loss in type, affecting the lower extremities. 2. There is no evidence of a lumbosacral radiculopathy.  EKG 07/08/2016:  QTc 469  IMPRESSION/PLAN: 1.  Bilateral feet pain worsening and most likely due to worsening erythromelalgia due to increased redness of the feet, moreso than neuropathy associated with autoimmune  disorder (Sjogren's disease), however since I am managing her neuropathy, I will try to optimize her medications to better determine to what degree her neuropathy is playing a part.  She will be continued on gabapentin 300mg  morning and afternoon and 600mg  at bedtime.  Start nortriptyline 10mg  at bedtime.  Will need to be cautious titrating due to borderline QTc and associated comorbidity of Sjogren's. Recheck EKG in 1 month.   Discussed starting Cymbalta, but she has tried this in the past and did not tolerate it.  Continue to use Aspercreme on lidocaine ointment as needed.  2.  Erythromelalgia - worsening -  recommend follow-up with rheumatology for management options.    Return to clinic in 2 months  The duration of this appointment visit was 30 minutes of face-to-face time with the patient.  Greater than 50% of this time was spent in counseling, explanation of diagnosis, planning of further management, and  coordination of care.   Thank you for allowing me to participate in patient's care.  If I can answer any additional questions, I would be pleased to do so.    Sincerely,    Donika K. Posey Pronto, DO

## 2016-09-15 NOTE — Patient Instructions (Addendum)
Continue gabapentin 300mg  in the morning, 300mg  in the afternoon, and 600mg  at bedtime Start nortriptyline 10mg  at bedtime Check EKG in 1 month. We have you scheduled for your EKG at [facility] in first floor radiology on 10/20/2016 at 1:00 pm. If this is not a good date/time please call [phone] to reschedule.   Follow-up with rheumatology for erythromelalgia management options  Return to clinic in 2 months

## 2016-09-17 ENCOUNTER — Encounter: Payer: Self-pay | Admitting: Family Medicine

## 2016-09-18 MED ORDER — SYNTHROID 88 MCG PO TABS: 88.0000 ug | ORAL_TABLET | Freq: Every day | ORAL | 3 refills | Status: DC

## 2016-09-25 ENCOUNTER — Ambulatory Visit (INDEPENDENT_AMBULATORY_CARE_PROVIDER_SITE_OTHER): Payer: Medicare Other | Admitting: Cardiothoracic Surgery

## 2016-09-25 ENCOUNTER — Encounter: Payer: Self-pay | Admitting: Cardiothoracic Surgery

## 2016-09-25 VITALS — BP 150/83 | HR 78 | Resp 18 | Ht 62.0 in | Wt 105.0 lb

## 2016-09-25 DIAGNOSIS — C3411 Malignant neoplasm of upper lobe, right bronchus or lung: Secondary | ICD-10-CM

## 2016-09-25 DIAGNOSIS — Z902 Acquired absence of lung [part of]: Secondary | ICD-10-CM

## 2016-09-25 NOTE — Progress Notes (Signed)
[facility] Record #[id number] Date of Birth: May 29, 1930  Referring XQ:JJHERD, Aundra Millet, MD Primary Cardiology: Primary Care:Tabori, Aundra Millet, MD  Chief Complaint:  Follow Up Visit  Surgery: 06/07/2013   OPERATIVE REPORT  PREOPERATIVE DIAGNOSIS: Right upper lobe adenocarcinoma of the lung.  POSTOPERATIVE DIAGNOSIS: Right upper lobe adenocarcinoma of the lung.  SURGICAL PROCEDURES: Video bronchoscopy, right video-assisted  thoracoscopy, mini-thoracotomy, right upper lobectomy with lymph node  dissection, wedge resection of right middle lobe, placement of On-Q.  SURGEON: Lanelle Bal, MD.  Lung cancer, Right upper lobe   Primary site: Lung (Right)   Staging method: AJCC 7th Edition   Clinical: Stage IIA (T2b, N0, M0) signed by [name], MD on 05/08/2013 10:00 PM   Pathologic: Stage IIA (T2b, N0, cM0) signed by [name], MD on 06/13/2013 10:49 AM   Summary: Stage IIA (T2b, N0, cM0)  Stage IIA (T2b, N0, M0) non-small cell lung cancer, adenocarcinoma with positive EGFR mutation in exon 21 ([id number]) presented with right upper lobe lung mass   History of Present Illness:     Patient presents for follow-up after right upper lobectomy with lymph node dissection for a stage II a non-small cell carcinoma the lung resected 06/07/2013.  Zubrod Score: At the time of surgery this patient's most appropriate activity status/level should be described as: _0     0    Normal activity, no symptoms _1     1    Restricted in physical strenuous activity but ambulatory, able to do out light work _2     2    Ambulatory and capable of self care, unable to do work activities, up and about                 >50 % of waking hours                                                                                   _3     3    Only limited self care, in bed greater than 50% of waking hours _4     4    Completely disabled, no self care, confined to  bed or chair _5     5    Moribund  History  Smoking Status  . Never Smoker  Smokeless Tobacco  . Never Used       Allergies  Allergen Reactions  . Amlodipine Rash  . Prochlorperazine Edisylate Anaphylaxis    Compazine  . Aspirin     REACTION: nose bleeds  . Pamelor [Nortriptyline Hcl] Other (See Comments)    Increased Heart rate and BP    Current Outpatient Prescriptions  Medication Sig Dispense Refill  . albuterol (PROVENTIL HFA;VENTOLIN HFA) 108 (90 Base) MCG/ACT inhaler Inhale 2 puffs into the lungs every 6 (six) hours as needed for wheezing or shortness of breath. 1 Inhaler 0  . Biotin 1000 MCG tablet Take 1,000 mcg by mouth daily.     . Calcium Carbonate-Vitamin D (CALCIUM 500 + D) 500-125 MG-UNIT TABS Take 1 tablet by mouth daily.     Marland Kitchen  diclofenac sodium (VOLTAREN) 1 % GEL APPLY 2-4 GRAMS UP TO 4 TIMES A DAY AS NEEDED FOR PAIN  2  . docusate sodium (STOOL SOFTENER) 100 MG capsule Take 100 mg by mouth 2 (two) times daily.      Marland Kitchen estradiol (ESTRACE VAGINAL) 0.1 MG/GM vaginal cream USE AS DIRECTED 42.5 g 2  . gabapentin (NEURONTIN) 300 MG capsule TAKE 300MG IN THE MORNING, 300MG IN THE AFTERNOON, AND 600MG AT BEDTIME. 120 capsule 5  . Glucosamine-Chondroit-Vit C-Mn (GLUCOSAMINE CHONDR 1500 COMPLX PO) Take 1 capsule by mouth daily.     Marland Kitchen HYDROcodone-acetaminophen (NORCO/VICODIN) 5-325 MG tablet Take 1 tablet by mouth every 12 (twelve) hours as needed. for pain  0  . meloxicam (MOBIC) 7.5 MG tablet Take 7.5 mg by mouth 2 (two) times daily as needed for pain.    . Multiple Vitamin (MULTIVITAMIN) tablet Take 1 tablet by mouth daily.      Marland Kitchen omeprazole (PRILOSEC) 20 MG capsule TAKE 1 CAPSULE (20 MG TOTAL) BY MOUTH 2 (TWO) TIMES DAILY BEFORE A MEAL. 60 capsule 6  . ondansetron (ZOFRAN ODT) 4 MG disintegrating tablet Take 1 tablet (4 mg total) by mouth every 8 (eight) hours as needed for nausea or vomiting. 30 tablet 0  . spironolactone (ALDACTONE) 25 MG tablet Take 12.5 mg by mouth  daily.   2  . SYNTHROID 88 MCG tablet Take 1 tablet (88 mcg total) by mouth daily before breakfast. 30 tablet 3   No current facility-administered medications for this visit.      Physical Exam: BP (!) 150/83   Pulse 78   Resp 18   Ht _0  (1.575 m)   Wt 105 lb (47.6 kg)   SpO2 98% Comment: RA  BMI 19.20 kg/m  Physical Exam  Constitutional: She is oriented to person, place, and time. No distress.  Eyes: No scleral icterus.  Neck: No JVD present. No tracheal deviation present. No thyromegaly present.  Cardiovascular: Normal rate, regular rhythm and normal heart sounds.  Exam reveals no gallop and no friction rub.   No murmur heard. Respiratory: Effort normal and breath sounds normal. No stridor. No respiratory distress. She has no wheezes. She has no rales. She exhibits no tenderness.  GI: Soft. Bowel sounds are normal.  Musculoskeletal: She exhibits no edema, tenderness or deformity.  Lymphadenopathy:    She has no cervical adenopathy.  Neurological: She is alert and oriented to person, place, and time. She has normal reflexes. She displays normal reflexes. She exhibits normal muscle tone.  Skin: Rash noted. She is not diaphoretic.  Psychiatric: She has a normal mood and affect. Her behavior is normal. Judgment and thought content normal.   Rash involving the lower extremities is improved compared to when seen a year ago     Diagnostic Studies & Laboratory data:         Recent Radiology Findings: CLINICAL DATA:  Lung cancer  EXAM: CT CHEST WITH CONTRAST  TECHNIQUE: Multidetector CT imaging of the chest was performed during intravenous contrast administration.  CONTRAST:  39m ISOVUE-300 IOPAMIDOL (ISOVUE-300) INJECTION 61%  COMPARISON:  02/21/2016  FINDINGS: Cardiovascular: The heart size is normal.  No pericardial effusion.  Mediastinum/Nodes: No mediastinal lymphadenopathy. There is no hilar lymphadenopathy. Somewhat patulous appearance esophagus is  stable. Small hiatal hernia again noted. There is no axillary lymphadenopathy.  Lungs/Pleura: Postsurgical change and scarring in the right apex is similar to prior. 4 mm nodular component seen image 37 series 5 is stable. Surgical changes in  the right hilum and pulmonary venous anatomy is compatible with the reported clinical history of previous right upper lobectomy. 3 mm left upper lobe pulmonary nodule (image 39 series 5) is stable. No focal airspace consolidation. No pulmonary edema or pleural effusion.  Upper Abdomen: Tiny hypervascular focus right liver is unchanged.  Musculoskeletal: Degenerative changes are noted in the shoulders. Bone windows reveal no worrisome lytic or sclerotic osseous lesions.  IMPRESSION: 1. Stable exam.  No new or progressive findings. 2. Small hiatal hernia.   Electronically Signed   By: [name] M.D.   On: 08/21/2016 14:01 I have independently reviewed the above radiology studies  and reviewed the findings with the patient.    Recent Labs: Lab Results  Component Value Date   WBC 4.0 08/21/2016   HGB 11.0 (L) 08/21/2016   HCT 33.6 (L) 08/21/2016   PLT 160 08/21/2016   GLUCOSE 82 08/21/2016   CHOL 177 07/02/2016   TRIG 117.0 07/02/2016   HDL 66.60 07/02/2016   LDLDIRECT 100.9 08/13/2011   LDLCALC 87 07/02/2016   ALT 13 08/21/2016   AST 27 08/21/2016   NA 130 (L) 08/21/2016   K 4.8 08/21/2016   CL 95 (L) 07/28/2016   CREATININE 1.2 (H) 08/21/2016   BUN 25.6 08/21/2016   CO2 26 08/21/2016   TSH 1.680 08/21/2016   INR 0.97 04/21/2014      Assessment / Plan:   Stable following right lung resection without evidence of recurrence by CT under observational treatment currently. Now 3 years post op Plan to see her back in 12 months, CT scan in 61month by Oncology     EGrace IsaacMD      [address] [place] [address]Office [phone]  Beeper [phone]

## 2016-09-29 ENCOUNTER — Other Ambulatory Visit (HOSPITAL_COMMUNITY): Payer: Medicare Other

## 2016-09-30 ENCOUNTER — Ambulatory Visit: Payer: Medicare Other | Admitting: Family Medicine

## 2016-09-30 ENCOUNTER — Other Ambulatory Visit (INDEPENDENT_AMBULATORY_CARE_PROVIDER_SITE_OTHER): Payer: Medicare Other

## 2016-09-30 DIAGNOSIS — E0590 Thyrotoxicosis, unspecified without thyrotoxic crisis or storm: Secondary | ICD-10-CM | POA: Diagnosis not present

## 2016-09-30 LAB — TSH: TSH: 2.48 u[IU]/mL (ref 0.35–4.50)

## 2016-10-01 ENCOUNTER — Encounter: Payer: Self-pay | Admitting: General Practice

## 2016-10-02 ENCOUNTER — Encounter: Payer: Self-pay | Admitting: Family Medicine

## 2016-10-20 ENCOUNTER — Other Ambulatory Visit (HOSPITAL_COMMUNITY): Payer: Medicare Other

## 2016-11-17 ENCOUNTER — Other Ambulatory Visit: Payer: Self-pay | Admitting: General Practice

## 2016-11-17 MED ORDER — SYNTHROID 88 MCG PO TABS: 88.0000 ug | ORAL_TABLET | Freq: Every day | ORAL | 1 refills | Status: DC

## 2016-11-19 ENCOUNTER — Ambulatory Visit: Payer: Medicare Other | Admitting: Neurology

## 2016-11-21 ENCOUNTER — Other Ambulatory Visit: Payer: Self-pay | Admitting: Family Medicine

## 2016-12-02 ENCOUNTER — Encounter: Payer: Self-pay | Admitting: Family Medicine

## 2016-12-02 MED ORDER — LEVOTHYROXINE SODIUM 88 MCG PO TABS: 88.0000 ug | ORAL_TABLET | Freq: Every day | ORAL | 1 refills | Status: DC

## 2016-12-10 ENCOUNTER — Other Ambulatory Visit: Payer: Self-pay | Admitting: Neurology

## 2016-12-26 ENCOUNTER — Other Ambulatory Visit: Payer: Self-pay | Admitting: General Practice

## 2016-12-26 NOTE — Telephone Encounter (Signed)
Last OV 07/28/16 Furosemide last filled 09/04/15 #30 with 3

## 2016-12-29 MED ORDER — FUROSEMIDE 20 MG PO TABS: 20.0000 mg | ORAL_TABLET | Freq: Every day | ORAL | 3 refills | Status: DC

## 2016-12-31 ENCOUNTER — Other Ambulatory Visit: Payer: Self-pay | Admitting: General Practice

## 2016-12-31 MED ORDER — FUROSEMIDE 20 MG PO TABS: 20.0000 mg | ORAL_TABLET | Freq: Every day | ORAL | 0 refills | Status: DC

## 2017-02-02 ENCOUNTER — Other Ambulatory Visit: Payer: Self-pay | Admitting: Family Medicine

## 2017-03-30 ENCOUNTER — Encounter: Payer: Self-pay | Admitting: Family Medicine

## 2017-03-31 ENCOUNTER — Other Ambulatory Visit: Payer: Self-pay | Admitting: Family Medicine

## 2017-03-31 DIAGNOSIS — Z1231 Encounter for screening mammogram for malignant neoplasm of breast: Secondary | ICD-10-CM

## 2017-04-02 ENCOUNTER — Telehealth: Payer: Self-pay

## 2017-04-02 NOTE — Telephone Encounter (Signed)
See telephone note 04/02/2017.

## 2017-04-02 NOTE — Telephone Encounter (Signed)
Please call Pt- inform her that Dr. Lorelei Pont has agreed to accept her and schedule visit at her convenience. Thank you.

## 2017-04-02 NOTE — Telephone Encounter (Signed)
Called pt and LVM informing her of Dr. Lillie Fragmin decision and advised her to call back to set up a new pt appt.

## 2017-04-02 NOTE — Telephone Encounter (Signed)
appt scheduled for 04/09/17 @ 3:30

## 2017-04-02 NOTE — Telephone Encounter (Addendum)
Pt unable to continue making commute to [place] office- she is wondering if Dr. Lorelei Pont would accept her. She was the wife of one of Dr. Ethel Rana Pt's as well before he passed away- I will send to him for potential approval. Please advise.

## 2017-04-02 NOTE — Telephone Encounter (Signed)
Ok to transfer- she's a wonderful pt!

## 2017-04-02 NOTE — Telephone Encounter (Signed)
Sure, I would be glad to see her

## 2017-04-02 NOTE — Telephone Encounter (Signed)
[place] with Dr Lorelei Pont for switch? Patient is ready to schedule if so.  Please advise. Call back [phone]

## 2017-04-07 NOTE — Progress Notes (Addendum)
[facility] [place], [place], [place] [address] [phone]  Date:  04/09/2017   Name:  [name]   DOB:  Jun 27, 1930   MRN:  [id number]  PCP:  Darreld Mclean, MD    Chief Complaint: transfer care (Former pt of Dr. Birdie Riddle. )   History of Present Illness:  [name] is a 84 y.o. very pleasant female patient who presents with the following:  Establishing care with me, former pt of Dr. Birdie Riddle who has moved to a new location Her late husband was a pt of Dr. Larose Kells She lives near to this location so has decided to start seeing me She lives in her own home, and has a caregiver come to her home a few days a week for a few hours She has suffered mostly from auto-immune issues over the years History of RA, lupus, scoliosis, spinal stenosis  [facility], for her epidural injections- Dr. Maia Petties Her rheum is with [facility] Neurologist is Dr. Posey Pronto She is no longer on plaquenil - had to stop due to side effects.  She hates that she had to stop it as it really helped her She is on mobic and also gabapentin  She is on synthroid 88 mcg right now- we will check TSH for her today  She uses spiro 12.5 daily for edema, and lasix on occasion  She did have lung cancer and had a lobectomy years ago 2015 Dr. 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  She really enjoys getting to spend time with him  Her main concern today is bowel change.  She has always had a hard time passing stool, but this seems to be getting worse.  She has to take a laxative- sometimes a strong dose- to have any BM anymore.  She has  not had a recent colonoscopy and was told that she is not a good candidate for this procedure at this time.   Admits that she is worried that there could be a mass in her abd causing her bowel changes.  She would like to do a CT scan which is a reasonable idea Lab Results  Component Value Date   TSH 2.48 09/30/2016     Patient Active Problem List   Diagnosis Date Noted  . Elevated cholesterol 10/11/2015  . 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[place]) 10/04/2014  . Bilateral leg edema 08/01/2014  . Elevated BP 08/01/2014  . Dehydration 08/01/2014  . Rheumatoid arthritis involving multiple joints ([place]) 05/30/2014  . Osteoarthritis of right hip 04/28/2014  . Status post total replacement of right hip 04/28/2014  . Long-term use of high-risk medication 11/11/2013  . Routine general medical examination at a health care facility 11/11/2013  . Anemia, unspecified 11/11/2013  . Neuropathic pain 07/07/2013  . Constipation due to pain medication 07/07/2013  . Protein-calorie malnutrition, severe ([place]) 06/08/2013  . Lung cancer, Right upper lobe 05/08/2013  . Anterior chest wall pain 09/18/2011  . Breast lump 09/18/2011  . Sciatica of right side 08/13/2011  . Sinusitis 06/13/2011  . Cerumen impaction 08/20/2010  .  SINUSITIS - ACUTE-NOS 04/30/2010  . Osteopenia 03/07/2010  . PARESTHESIA 03/07/2010  . CONTUSION OF LOWER LEG 11/29/2009  . DIZZINESS 10/01/2009  . BACK PAIN, LEFT 03/16/2009  . CT, CHEST, ABNORMAL 12/18/2008  . ABNORMAL ECHOCARDIOGRAM 12/14/2008  . URI 11/29/2008  . [place] SYNDROME 11/29/2008  . CONTACT DERMATITIS 11/02/2006  . HYPOGLYCEMIA 06/29/2006  . RAYNAUD'S DISEASE 06/29/2006    Past Medical History:  Diagnosis Date  . Abnormal ECG 11/2008   left bbb  . Arthritis   . Back pain    scoliosis  . Cancer ([place]) 2015   lung  . Colon polyps   . Constipation    takes Colace daily  . Dry eyes    eye drops used   . GERD (gastroesophageal reflux disease)    takes  Omeprazole daily  . Iron deficiency anemia   . Lupus    takes Plaquenil daily  . Osteoporosis   . PONV (postoperative nausea and vomiting)    likes phenergan  . Raynaud's phenomenon 1965  . Rheumatoid arthritis ([place])   . Sciatica   . Sjogren's syndrome [facility])     Past Surgical History:  Procedure Laterality Date  . APPENDECTOMY    . CARDIOVASCULAR STRESS TEST     12/2008 mild fixed basal to mid septal perfusion defect felt likely due to artifact from LBBB, no ischemia, EF 58%  . CATARACT EXTRACTION Bilateral   . COLONOSCOPY    . LYMPH NODE DISSECTION Right 06/07/2013   Procedure: LYMPH NODE DISSECTION;  Surgeon: [name], MD;  Location: [facility];  Service: Thoracic;  Laterality: Right;  . [place]  . THORACOTOMY  06/07/2013   Procedure: MINI/LIMITED THORACOTOMY; right middle lobe wedge resection;  Surgeon: [name], MD;  Location: [facility];  Service: Thoracic;;  . TONSILLECTOMY    . TOTAL ABDOMINAL HYSTERECTOMY  1980's   . TOTAL HIP ARTHROPLASTY Right 04/28/2014   Procedure: RIGHT TOTAL HIP ARTHROPLASTY ANTERIOR APPROACH;  Surgeon: [name], MD;  [facility];  Service: Orthopedics;  Laterality: Right;  Marland Kitchen VIDEO ASSISTED THORACOSCOPY (VATS)/WEDGE RESECTION Right 06/07/2013   Procedure: VIDEO ASSISTED THORACOSCOPY (VATS)/right upper lobectomy, On Q;  Surgeon: [name], MD;  Location: [facility];  Service: Thoracic;  Laterality: Right;  Marland Kitchen VIDEO BRONCHOSCOPY N/A 06/07/2013   Procedure: VIDEO BRONCHOSCOPY;  Surgeon: [name], MD;  [facility];  Service: Thoracic;  Laterality: N/A;  . VIDEO BRONCHOSCOPY WITH ENDOBRONCHIAL NAVIGATION N/A 05/04/2013   Procedure: VIDEO BRONCHOSCOPY WITH ENDOBRONCHIAL NAVIGATION;  Surgeon: [name], MD;  Location: [facility];  Service: Thoracic;  Laterality: N/A;    Social History   Tobacco Use  . Smoking status: Never Smoker  . Smokeless tobacco: Never Used  Substance Use Topics  .  Alcohol use: Yes    Comment: rare/social  . Drug use: No    Family History  Problem Relation Age of Onset  . Coronary artery disease Father   . Colon cancer Father   . Diabetes Father   . Cancer Father        colon  . Other Mother 34       MVA  . Healthy Sister   . Other Brother        Killed on war  . Healthy Daughter   . Esophageal cancer Neg Hx   . Kidney disease Neg Hx   . Liver disease Neg Hx     Allergies  Allergen Reactions  . Amlodipine Rash  .  Prochlorperazine Edisylate Anaphylaxis    Compazine  . Aspirin     REACTION: nose bleeds  . Pamelor [Nortriptyline Hcl] Other (See Comments)    Increased Heart rate and BP    Medication list has been reviewed and updated.  Current Outpatient Medications on File Prior to Visit  Medication Sig Dispense Refill  . albuterol (PROVENTIL HFA;VENTOLIN HFA) 108 (90 Base) MCG/ACT inhaler Inhale 2 puffs into the lungs every 6 (six) hours as needed for wheezing or shortness of breath. 1 Inhaler 0  . Biotin 1000 MCG tablet Take 1,000 mcg by mouth daily.     . Calcium Carbonate-Vitamin D (CALCIUM 500 + D) 500-125 MG-UNIT TABS Take 1 tablet by mouth daily.     Marland Kitchen docusate sodium (STOOL SOFTENER) 100 MG capsule Take 100 mg by mouth 2 (two) times daily.      Marland Kitchen estradiol (ESTRACE VAGINAL) 0.1 MG/GM vaginal cream USE AS DIRECTED 42.5 g 2  . furosemide (LASIX) 20 MG tablet Take 1 tablet (20 mg total) daily by mouth. 90 tablet 0  . gabapentin (NEURONTIN) 300 MG capsule TAKE 300MG  IN THE MORNING, 300MG  IN THE AFTERNOON, AND 600MG  AT BEDTIME. 120 capsule 5  . Glucosamine-Chondroit-Vit C-Mn (GLUCOSAMINE CHONDR 1500 COMPLX PO) Take 1 capsule by mouth daily.     Marland Kitchen HYDROcodone-acetaminophen (NORCO/VICODIN) 5-325 MG tablet Take 1 tablet by mouth every 12 (twelve) hours as needed. for pain  0  . levothyroxine (SYNTHROID) 88 MCG tablet Take 1 tablet (88 mcg total) by mouth daily before breakfast. 90 tablet 1  . meloxicam (MOBIC) 7.5 MG tablet Take 7.5  mg by mouth 2 (two) times daily as needed for pain.    . Multiple Vitamin (MULTIVITAMIN) tablet Take 1 tablet by mouth daily.      Marland Kitchen omeprazole (PRILOSEC) 20 MG capsule TAKE 1 CAPSULE BY MOUTH 2 TIMES DAILY BEFORE A MEAL. 180 capsule 1  . spironolactone (ALDACTONE) 25 MG tablet Take 12.5 mg by mouth daily.   2   No current facility-administered medications on file prior to visit.     Review of Systems:  As per HPI- otherwise negative.   Physical Examination: Vitals:   04/09/17 1516  BP: 112/70  Pulse: 93  Temp: 98.1 F (36.7 C)  SpO2: 95%   Vitals:   04/09/17 1516  Weight: 111 lb (50.3 kg)  Height: 5\' 2"  (1.575 m)   Body mass index is 20.3 kg/m. Ideal Body Weight: Weight in (lb) to have BMI = 25: 136.4  GEN: WDWN, NAD, Non-toxic, A & O x 3, petite build with significant scoliosis.  Slender  HEENT: Atraumatic, Normocephalic. Neck supple. No masses, No LAD. Ears and Nose: No external deformity. CV: RRR, No M/G/R. No JVD. No thrill. No extra heart sounds. PULM: CTA B, no wheezes, crackles, rhonchi. No retractions. No resp. distress. No accessory muscle use. ABD: S, NT, ND EXTR: No c/c/e NEURO Normal gait.  PSYCH: Normally interactive. Conversant. Not depressed or anxious appearing.  Calm demeanor.    Assessment and Plan: Acquired hypothyroidism - Plan: TSH  Rheumatoid arthritis involving multiple joints (HCC)  Medication monitoring encounter - Plan: Comprehensive metabolic panel  Abdominal distension (gaseous) - Plan: CT Abdomen Pelvis W Contrast  Chronic constipation - Plan: CT Abdomen Pelvis W Contrast  Labs pending as above Will obtain CT for her- she has noted bowel habit changes and she is not a very good candidate for a colonoscopy  Will plan further follow- up pending labs.   Signed Janett Billow Copland,  MD  Received her labs 2/22- message to pt Your thyroid level is normal Your metabolic profile looks ok except your sodium is a bit low.  However,  looking back this is not a new finding and has been present for at least a couple of years.  Do you recall if this has ever been investigated?  Your renal function is a bit off, but this is also not new. We will continue to monitor.    Please let me know about your sodium, and take care. Limiting your intake of water- that is, just drinking when you feel thirsty- can help to manage low sodium. We can plan to visit in about 6 months   Results for orders placed or performed in visit on 04/09/17  TSH  Result Value Ref Range   TSH 0.63 0.35 - 4.50 uIU/mL  Comprehensive metabolic panel  Result Value Ref Range   Sodium 130 (L) 135 - 145 mEq/L   Potassium 4.8 3.5 - 5.1 mEq/L   Chloride 96 96 - 112 mEq/L   CO2 27 19 - 32 mEq/L   Glucose, Bld 103 (H) 70 - 99 mg/dL   BUN 34 (H) 6 - 23 mg/dL   Creatinine, Ser 1.30 (H) 0.40 - 1.20 mg/dL   Total Bilirubin 0.3 0.2 - 1.2 mg/dL   Alkaline Phosphatase 55 39 - 117 U/L   AST 25 0 - 37 U/L   ALT 12 0 - 35 U/L   Total Protein 6.9 6.0 - 8.3 g/dL   Albumin 4.0 3.5 - 5.2 g/dL   Calcium 9.2 8.4 - 10.5 mg/dL   GFR 41.44 (L) >60.00 mL/min

## 2017-04-09 ENCOUNTER — Ambulatory Visit: Payer: Medicare Other | Admitting: Family Medicine

## 2017-04-09 VITALS — BP 112/70 | HR 93 | Temp 98.1°F | Ht 62.0 in | Wt 111.0 lb

## 2017-04-09 DIAGNOSIS — M069 Rheumatoid arthritis, unspecified: Secondary | ICD-10-CM

## 2017-04-09 DIAGNOSIS — E039 Hypothyroidism, unspecified: Secondary | ICD-10-CM | POA: Diagnosis not present

## 2017-04-09 DIAGNOSIS — Z5181 Encounter for therapeutic drug level monitoring: Secondary | ICD-10-CM | POA: Diagnosis not present

## 2017-04-09 DIAGNOSIS — K5909 Other constipation: Secondary | ICD-10-CM | POA: Diagnosis not present

## 2017-04-09 DIAGNOSIS — R140 Abdominal distension (gaseous): Secondary | ICD-10-CM | POA: Diagnosis not present

## 2017-04-09 NOTE — Patient Instructions (Signed)
It was very nice to meet you today I will obtain your labs and be in touch with your reports We will also set you up for a CT scan of your abdomen- this will help [place] make sure there is nothing else causing your chronic constipation

## 2017-04-10 ENCOUNTER — Ambulatory Visit: Payer: Medicare Other | Admitting: Family Medicine

## 2017-04-10 ENCOUNTER — Encounter: Payer: Self-pay | Admitting: Family Medicine

## 2017-04-10 LAB — COMPREHENSIVE METABOLIC PANEL
ALBUMIN: 4 g/dL (ref 3.5–5.2)
ALK PHOS: 55 U/L (ref 39–117)
ALT: 12 U/L (ref 0–35)
AST: 25 U/L (ref 0–37)
BILIRUBIN TOTAL: 0.3 mg/dL (ref 0.2–1.2)
BUN: 34 mg/dL — AB (ref 6–23)
CO2: 27 mEq/L (ref 19–32)
CREATININE: 1.3 mg/dL — AB (ref 0.40–1.20)
Calcium: 9.2 mg/dL (ref 8.4–10.5)
Chloride: 96 mEq/L (ref 96–112)
GFR: 41.44 mL/min — ABNORMAL LOW (ref >60.00)
GLUCOSE: 103 mg/dL — AB (ref 70–99)
POTASSIUM: 4.8 meq/L (ref 3.5–5.1)
SODIUM: 130 meq/L — AB (ref 135–145)
TOTAL PROTEIN: 6.9 g/dL (ref 6.0–8.3)

## 2017-04-10 LAB — TSH: TSH: 0.63 u[IU]/mL (ref 0.35–4.50)

## 2017-04-12 ENCOUNTER — Other Ambulatory Visit: Payer: Self-pay | Admitting: Family Medicine

## 2017-04-12 DIAGNOSIS — E871 Hypo-osmolality and hyponatremia: Secondary | ICD-10-CM

## 2017-04-17 ENCOUNTER — Ambulatory Visit (HOSPITAL_BASED_OUTPATIENT_CLINIC_OR_DEPARTMENT_OTHER)
Admission: RE | Admit: 2017-04-17 | Discharge: 2017-04-17 | Disposition: A | Discharge status: Home / Self Care | Payer: Medicare Other | Source: Ambulatory Visit | Attending: Family Medicine | Admitting: Family Medicine

## 2017-04-17 ENCOUNTER — Encounter: Payer: Self-pay | Admitting: Family Medicine

## 2017-04-17 ENCOUNTER — Inpatient Hospital Stay (HOSPITAL_BASED_OUTPATIENT_CLINIC_OR_DEPARTMENT_OTHER): Admission: RE | Admit: 2017-04-17 | Payer: Medicare Other | Source: Ambulatory Visit

## 2017-04-17 DIAGNOSIS — K5909 Other constipation: Secondary | ICD-10-CM

## 2017-04-17 DIAGNOSIS — M5136 Other intervertebral disc degeneration, lumbar region: Secondary | ICD-10-CM | POA: Diagnosis not present

## 2017-04-17 DIAGNOSIS — R140 Abdominal distension (gaseous): Secondary | ICD-10-CM | POA: Diagnosis present

## 2017-04-17 DIAGNOSIS — Z1231 Encounter for screening mammogram for malignant neoplasm of breast: Secondary | ICD-10-CM

## 2017-04-17 DIAGNOSIS — K449 Diaphragmatic hernia without obstruction or gangrene: Secondary | ICD-10-CM | POA: Diagnosis not present

## 2017-04-17 DIAGNOSIS — M4186 Other forms of scoliosis, lumbar region: Secondary | ICD-10-CM | POA: Diagnosis not present

## 2017-04-17 MED ORDER — IOPAMIDOL (ISOVUE-300) INJECTION 61%
80.0000 mL | Freq: Once | INTRAVENOUS | Status: AC | PRN
  Administered 2017-04-17: 80 mL via INTRAVENOUS

## 2017-05-21 ENCOUNTER — Other Ambulatory Visit: Payer: Self-pay | Admitting: Family Medicine

## 2017-05-27 ENCOUNTER — Encounter: Payer: Self-pay | Admitting: Family Medicine

## 2017-06-01 ENCOUNTER — Other Ambulatory Visit (INDEPENDENT_AMBULATORY_CARE_PROVIDER_SITE_OTHER): Payer: Medicare Other

## 2017-06-01 DIAGNOSIS — E871 Hypo-osmolality and hyponatremia: Secondary | ICD-10-CM | POA: Diagnosis not present

## 2017-06-01 LAB — BASIC METABOLIC PANEL
BUN / CREAT RATIO: 21 (calc) (ref 6–22)
BUN: 27 mg/dL — ABNORMAL HIGH (ref 7–25)
CHLORIDE: 99 mmol/L (ref 98–110)
CO2: 25 mmol/L (ref 20–32)
CREATININE: 1.27 mg/dL — AB (ref 0.60–0.88)
Calcium: 9.3 mg/dL (ref 8.6–10.4)
Glucose, Bld: 147 mg/dL — ABNORMAL HIGH (ref 65–99)
POTASSIUM: 4.2 mmol/L (ref 3.5–5.3)
Sodium: 131 mmol/L — ABNORMAL LOW (ref 135–146)

## 2017-06-04 ENCOUNTER — Telehealth: Payer: Self-pay | Admitting: Family Medicine

## 2017-06-04 NOTE — Telephone Encounter (Signed)
Received her repeat BMP and gave her a call.  Na is slightly better but still low Results for orders placed or performed in visit on 49/61/16  Basic metabolic panel  Result Value Ref Range   Glucose, Bld 147 (H) 65 - 99 mg/dL   BUN 27 (H) 7 - 25 mg/dL   Creat 1.27 (H) 0.60 - 0.88 mg/dL   BUN/Creatinine Ratio 21 6 - 22 (calc)   Sodium 131 (L) 135 - 146 mmol/L   Potassium 4.2 3.5 - 5.3 mmol/L   Chloride 99 98 - 110 mmol/L   CO2 25 20 - 32 mmol/L   Calcium 9.3 8.6 - 10.4 mg/dL   She takes a 1/2 spiro daily but only occasionally uses her lasix. 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 Will continue to monitor for her

## 2017-07-07 NOTE — Progress Notes (Signed)
Subjective:   [name] is a 84 y.o. female who presents for Medicare Annual (Subsequent) preventive examination.  Review of Systems: No ROS.  Medicare Wellness Visit. Additional risk factors are reflected in the social history. Cardiac Risk Factors include: advanced age (>20men, >66 women) Sleep patterns: wakes 2-3x to urinate. Goes back sleep easily. Sleeps about 6 hrs. Home Safety/Smoke Alarms: Feels safe in home. Smoke alarms in place.  Living environment; residence and Firearm Safety: Lives alone in 2 story home. Does not use upstairs. Stairs going into home has rails on each side.  Pt has caregiver come 3 days per week for 4 hrs per day.  Female:        Mammo-utd       Dexa scan-  Declines        Objective:     Vitals: BP 140/66 (BP Location: Left Arm, Patient Position: Sitting, Cuff Size: Normal) Comment: Pt states her BP is always that high at MD appts. Comment (BP Location): 119/68 pt reports this morning at home.  Pulse 87   Ht 5\' 2"  (1.575 m)   Wt 110 lb 3.2 oz (50 kg)   SpO2 98%   BMI 20.16 kg/m   Body mass index is 20.16 kg/m.  Advanced Directives 07/09/2017 08/26/2016 02/22/2016 08/09/2015 08/09/2014 07/29/2014 04/28/2014  Does Patient Have a Medical Advance Directive? Yes Yes Yes Yes Yes No Yes  Type of Paramedic of [place];Living will [place];Living will [place];Living will [place];Living will Healthcare Power of [place];Living will  Does patient want to make changes to medical advance directive? No - Patient declined - - - - - No - Patient declined  Copy of [place] in Chart? No - copy requested No - copy requested No - copy requested Yes - - No - copy requested  Would patient like information on creating a medical advance directive? - - - - - No - patient declined information -  Pre-existing out of facility DNR order  (yellow form or pink MOST form) - - - - - - -    Tobacco Social History   Tobacco Use  Smoking Status Never Smoker  Smokeless Tobacco Never Used     Counseling given: Not Answered   Clinical Intake: Pain : No/denies pain     Past Medical History:  Diagnosis Date  . Abnormal ECG 11/2008   left bbb  . Arthritis   . Back pain    scoliosis  . Cancer ([place]) 2015   lung  . Colon polyps   . Constipation    takes Colace daily  . Dry eyes    eye drops used   . GERD (gastroesophageal reflux disease)    takes Omeprazole daily  . Iron deficiency anemia   . Lupus ([place])    takes Plaquenil daily  . Osteoporosis   . PONV (postoperative nausea and vomiting)    likes phenergan  . Raynaud's phenomenon 1965  . Rheumatoid arthritis ([place])   . Sciatica   . Sjogren's syndrome [facility])    Past Surgical History:  Procedure Laterality Date  . APPENDECTOMY    . CARDIOVASCULAR STRESS TEST     12/2008 mild fixed basal to mid septal perfusion defect felt likely due to artifact from LBBB, no ischemia, EF 58%  . CATARACT EXTRACTION Bilateral   . COLONOSCOPY    . LYMPH NODE DISSECTION Right 06/07/2013   Procedure:  LYMPH NODE DISSECTION;  Surgeon: [name], MD;  Location: [facility];  Service: Thoracic;  Laterality: Right;  . [place]  . THORACOTOMY  06/07/2013   Procedure: MINI/LIMITED THORACOTOMY; right middle lobe wedge resection;  Surgeon: [name], MD;  Location: [facility];  Service: Thoracic;;  . TONSILLECTOMY    . TOTAL ABDOMINAL HYSTERECTOMY  1980's   . TOTAL HIP ARTHROPLASTY Right 04/28/2014   Procedure: RIGHT TOTAL HIP ARTHROPLASTY ANTERIOR APPROACH;  Surgeon: [name], MD;  [facility];  Service: Orthopedics;  Laterality: Right;  Marland Kitchen VIDEO ASSISTED THORACOSCOPY (VATS)/WEDGE RESECTION Right 06/07/2013   Procedure: VIDEO ASSISTED THORACOSCOPY (VATS)/right upper lobectomy, On Q;  Surgeon: [name], MD;  Location: [facility];  Service:  Thoracic;  Laterality: Right;  Marland Kitchen VIDEO BRONCHOSCOPY N/A 06/07/2013   Procedure: VIDEO BRONCHOSCOPY;  Surgeon: [name], MD;  Location: [facility];  Service: Thoracic;  Laterality: N/A;  . VIDEO BRONCHOSCOPY WITH ENDOBRONCHIAL NAVIGATION N/A 05/04/2013   Procedure: VIDEO BRONCHOSCOPY WITH ENDOBRONCHIAL NAVIGATION;  Surgeon: [name], MD;  [facility];  Service: Thoracic;  Laterality: N/A;   Family History  Problem Relation Age of Onset  . Coronary artery disease Father   . Colon cancer Father   . Diabetes Father   . Cancer Father        colon  . Other Mother 11       MVA  . Healthy Sister   . Other Brother        Killed on war  . Healthy Daughter   . Esophageal cancer Neg Hx   . Kidney disease Neg Hx   . Liver disease Neg Hx    Social History   Socioeconomic History  . Marital status: Widowed    Spouse name: Not on file  . Number of children: 2  . Years of education: Not on file  . Highest education level: Not on file  Occupational History  . Occupation: n/a  Social Needs  . Financial resource strain: Not on file  . Food insecurity:    Worry: Not on file    Inability: Not on file  . Transportation needs:    Medical: Not on file    Non-medical: Not on file  Tobacco Use  . Smoking status: Never Smoker  . Smokeless tobacco: Never Used  Substance and Sexual Activity  . Alcohol use: Not Currently    Frequency: Never    Comment: rare/social  . Drug use: No  . Sexual activity: Not Currently  Lifestyle  . Physical activity:    Days per week: Not on file    Minutes per session: Not on file  . Stress: Not on file  Relationships  . Social connections:    Talks on phone: Not on file    Gets together: Not on file    Attends religious service: Not on file    Active member of club or organization: Not on file    Attends meetings of clubs or organizations: Not on file    Relationship status: Not on file  Other Topics Concern  . Not on file  Social  History Narrative   Lives with husband, daughter and grandchild local.   Highest level of education:  masters in education admin and technology    Outpatient Encounter Medications as of 07/09/2017  Medication Sig  . albuterol (PROVENTIL HFA;VENTOLIN HFA) 108 (90 Base) MCG/ACT inhaler Inhale 2 puffs into the lungs every 6 (six) hours as  needed for wheezing or shortness of breath.  . Biotin 1000 MCG tablet Take 1,000 mcg by mouth daily.   . Calcium Carbonate-Vitamin D (CALCIUM 500 + D) 500-125 MG-UNIT TABS Take 1 tablet by mouth daily.   Marland Kitchen docusate sodium (STOOL SOFTENER) 100 MG capsule Take 100 mg by mouth 2 (two) times daily.    Marland Kitchen estradiol (ESTRACE VAGINAL) 0.1 MG/GM vaginal cream USE AS DIRECTED  . furosemide (LASIX) 20 MG tablet Take 1 tablet (20 mg total) daily by mouth.  . gabapentin (NEURONTIN) 300 MG capsule TAKE 300MG  IN THE MORNING, 300MG  IN THE AFTERNOON, AND 600MG  AT BEDTIME.  Marland Kitchen Glucosamine-Chondroit-Vit C-Mn (GLUCOSAMINE CHONDR 1500 COMPLX PO) Take 1 capsule by mouth daily.   Marland Kitchen HYDROcodone-acetaminophen (NORCO/VICODIN) 5-325 MG tablet Take 1 tablet by mouth every 12 (twelve) hours as needed. for pain  . levothyroxine (SYNTHROID, LEVOTHROID) 88 MCG tablet TAKE 1 TABLET (88 MCG TOTAL) BY MOUTH DAILY BEFORE BREAKFAST.  . meloxicam (MOBIC) 7.5 MG tablet Take 7.5 mg by mouth 2 (two) times daily as needed for pain.  . Multiple Vitamin (MULTIVITAMIN) tablet Take 1 tablet by mouth daily.    Marland Kitchen omeprazole (PRILOSEC) 20 MG capsule TAKE 1 CAPSULE BY MOUTH 2 TIMES DAILY BEFORE A MEAL.  Marland Kitchen spironolactone (ALDACTONE) 25 MG tablet Take 12.5 mg by mouth daily.    No facility-administered encounter medications on file as of 07/09/2017.     Activities of Daily Living In your present state of health, do you have any difficulty performing the following activities: 07/09/2017  Hearing? N  Vision? N  Comment wears reading glasses.   Difficulty concentrating or making decisions? Y  Walking or climbing  stairs? N  Dressing or bathing? N  Doing errands, shopping? N  Preparing Food and eating ? N  Using the Toilet? N  In the past six months, have you accidently leaked urine? N  Do you have problems with loss of bowel control? N  Managing your Medications? N  Managing your Finances? N  Housekeeping or managing your Housekeeping? N  Some recent data might be hidden    Patient Care Team: Copland, Gay Filler, MD as PCP - General (Family Medicine) Rigoberto Noel, MD as Consulting Physician (Pulmonary Disease) [name], MD as Consulting Physician (Internal Medicine) Curt Bears, MD as Consulting Physician (Oncology) [name], MD as Consulting Physician (Cardiothoracic Surgery) Zonia Kief, MD as Consulting Physician (Rehabilitation) Alda Berthold, DO as Consulting Physician (Neurology)    Assessment:   This is a routine wellness examination for [place]. Physical assessment deferred to PCP.  Exercise Activities and Dietary recommendations Current Exercise Habits: The patient does not participate in regular exercise at present, Exercise limited by: orthopedic condition(s);neurologic condition(s) Diet (meal preparation, eat out, water intake, caffeinated beverages, dairy products, fruits and vegetables): in general, a "healthy" diet  , well balanced  Goals    . Maintain independence       Fall Risk Fall Risk  07/09/2017 09/15/2016 07/03/2016 07/02/2016 12/17/2015  Falls in the past year? No Yes Yes No -  Number falls in past yr: - 2 or more 1 - 2 or more  Injury with Fall? - No No - No  Risk Factor Category  - - High Fall Risk - -  Risk for fall due to : - Other (Comment) - - Impaired balance/gait;Impaired mobility  Risk for fall due to: Comment - - - - -  Follow up - Falls evaluation completed;Education provided;Falls prevention discussed Falls evaluation completed -  Falls evaluation completed;Education provided;Falls prevention discussed     Depression  Screen PHQ 2/9 Scores 07/09/2017 07/02/2016 05/22/2015 08/01/2014  PHQ - 2 Score 0 0 0 0  PHQ- 9 Score - 0 - -     Cognitive Function MMSE - Mini Mental State Exam 07/09/2017  Orientation to time 5  Orientation to Place 5  Registration 3  Attention/ Calculation 5  Recall 3  Language- name 2 objects 2  Language- repeat 1  Language- follow 3 step command 3  Language- read & follow direction 1  Write a sentence 1  Copy design 1  Total score 30        Immunization History  Administered Date(s) Administered  . Influenza Split 11/21/2011  . Influenza Whole 11/29/2007, 11/29/2008, 11/29/2009  . Influenza, High Dose Seasonal PF 10/30/2012, 01/02/2015  . Influenza,inj,Quad PF,6+ Mos 11/22/2013, 11/14/2015  . Pneumococcal Conjugate-13 05/22/2015  . Pneumococcal Polysaccharide-23 06/13/2013  . Zoster 01/27/2014   Screening Tests Health Maintenance  Topic Date Due  . TETANUS/TDAP  11/18/1949  . INFLUENZA VACCINE  09/17/2017  . DEXA SCAN  Completed  . PNA vac Low Risk Adult  Completed        Plan:   Follow up with PCP as directed  Please schedule your next medicare wellness visit with me in 1 yr.  Continue to eat heart healthy diet (full of fruits, vegetables, whole grains, lean protein, water--limit salt, fat, and sugar intake) and increase physical activity as tolerated.  Continue doing brain stimulating activities (puzzles, reading, adult coloring books, staying active) to keep memory sharp.     I have personally reviewed and noted the following in the patient's chart:   . Medical and social history . Use of alcohol, tobacco or illicit drugs  . Current medications and supplements . Functional ability and status . Nutritional status . Physical activity . Advanced directives . List of other physicians . Hospitalizations, surgeries, and ER visits in previous 12 months . Vitals . Screenings to include cognitive, depression, and falls . Referrals and appointments  In  addition, I have reviewed and discussed with patient certain preventive protocols, quality metrics, and best practice recommendations. A written personalized care plan for preventive services as well as general preventive health recommendations were provided to patient.     Naaman Plummer [place], [place]  07/09/2017

## 2017-07-09 ENCOUNTER — Ambulatory Visit (INDEPENDENT_AMBULATORY_CARE_PROVIDER_SITE_OTHER): Payer: Medicare Other | Admitting: *Deleted

## 2017-07-09 ENCOUNTER — Encounter: Payer: Self-pay | Admitting: *Deleted

## 2017-07-09 VITALS — BP 140/66 | HR 87 | Ht 62.0 in | Wt 110.2 lb

## 2017-07-09 DIAGNOSIS — Z0000 Encounter for general adult medical examination without abnormal findings: Secondary | ICD-10-CM

## 2017-07-09 NOTE — Patient Instructions (Signed)
Follow up with PCP as directed  Please schedule your next medicare wellness visit with me in 1 yr.  Continue to eat heart healthy diet (full of fruits, vegetables, whole grains, lean protein, water--limit salt, fat, and sugar intake) and increase physical activity as tolerated.  Continue doing brain stimulating activities (puzzles, reading, adult coloring books, staying active) to keep memory sharp.    [name] , Thank you for taking time to come for your Medicare Wellness Visit. I appreciate your ongoing commitment to your health goals. Please review the following plan we discussed and let me know if I can assist you in the future.   These are the goals we discussed: Goals    . Maintain independence       This is a list of the screening recommended for you and due dates:  Health Maintenance  Topic Date Due  . Tetanus Vaccine  11/18/1949  . Flu Shot  09/17/2017  . DEXA scan (bone density measurement)  Completed  . Pneumonia vaccines  Completed    Health Maintenance for Postmenopausal Women Menopause is a normal process in which your reproductive ability comes to an end. This process happens gradually over a span of months to years, usually between the ages of 11 and 90. Menopause is complete when you have missed 12 consecutive menstrual periods. It is important to talk with your health care provider about some of the most common conditions that affect postmenopausal women, such as heart disease, cancer, and bone loss (osteoporosis). Adopting a healthy lifestyle and getting preventive care can help to promote your health and wellness. Those actions can also lower your chances of developing some of these common conditions. What should I know about menopause? During menopause, you may experience a number of symptoms, such as:  Moderate-to-severe hot flashes.  Night sweats.  Decrease in sex drive.  Mood swings.  Headaches.  Tiredness.  Irritability.  Memory  problems.  Insomnia.  Choosing to treat or not to treat menopausal changes is an individual decision that you make with your health care provider. What should I know about hormone replacement therapy and supplements? Hormone therapy products are effective for treating symptoms that are associated with menopause, such as hot flashes and night sweats. Hormone replacement carries certain risks, especially as you become older. If you are thinking about using estrogen or estrogen with progestin treatments, discuss the benefits and risks with your health care provider. What should I know about heart disease and stroke? Heart disease, heart attack, and stroke become more likely as you age. This may be due, in part, to the hormonal changes that your body experiences during menopause. These can affect how your body processes dietary fats, triglycerides, and cholesterol. Heart attack and stroke are both medical emergencies. There are many things that you can do to help prevent heart disease and stroke:  Have your blood pressure checked at least every 1-2 years. High blood pressure causes heart disease and increases the risk of stroke.  If you are 31-61 years old, ask your health care provider if you should take aspirin to prevent a heart attack or a stroke.  Do not use any tobacco products, including cigarettes, chewing tobacco, or electronic cigarettes. If you need help quitting, ask your health care provider.  It is important to eat a healthy diet and maintain a healthy weight. ? Be sure to include plenty of vegetables, fruits, low-fat dairy products, and lean protein. ? Avoid eating foods that are high in solid fats,  added sugars, or salt (sodium).  Get regular exercise. This is one of the most important things that you can do for your health. ? Try to exercise for at least 150 minutes each week. The type of exercise that you do should increase your heart rate and make you sweat. This is known as  moderate-intensity exercise. ? Try to do strengthening exercises at least twice each week. Do these in addition to the moderate-intensity exercise.  Know your numbers.Ask your health care provider to check your cholesterol and your blood glucose. Continue to have your blood tested as directed by your health care provider.  What should I know about cancer screening? There are several types of cancer. Take the following steps to reduce your risk and to catch any cancer development as early as possible. Breast Cancer  Practice breast self-awareness. ? This means understanding how your breasts normally appear and feel. ? It also means doing regular breast self-exams. Let your health care provider know about any changes, no matter how small.  If you are 60 or older, have a clinician do a breast exam (clinical breast exam or CBE) every year. Depending on your age, family history, and medical history, it may be recommended that you also have a yearly breast X-ray (mammogram).  If you have a family history of breast cancer, talk with your health care provider about genetic screening.  If you are at high risk for breast cancer, talk with your health care provider about having an MRI and a mammogram every year.  Breast cancer (BRCA) gene test is recommended for women who have family members with BRCA-related cancers. Results of the assessment will determine the need for genetic counseling and BRCA1 and for BRCA2 testing. BRCA-related cancers include these types: ? Breast. This occurs in males or females. ? Ovarian. ? Tubal. This may also be called fallopian tube cancer. ? Cancer of the abdominal or pelvic lining (peritoneal cancer). ? Prostate. ? Pancreatic.  Cervical, Uterine, and Ovarian Cancer Your health care provider may recommend that you be screened regularly for cancer of the pelvic organs. These include your ovaries, uterus, and vagina. This screening involves a pelvic exam, which  includes checking for microscopic changes to the surface of your cervix (Pap test).  For women ages 21-65, health care providers may recommend a pelvic exam and a Pap test every three years. For women ages 41-65, they may recommend the Pap test and pelvic exam, combined with testing for human papilloma virus (HPV), every five years. Some types of HPV increase your risk of cervical cancer. Testing for HPV may also be done on women of any age who have unclear Pap test results.  Other health care providers may not recommend any screening for nonpregnant women who are considered low risk for pelvic cancer and have no symptoms. Ask your health care provider if a screening pelvic exam is right for you.  If you have had past treatment for cervical cancer or a condition that could lead to cancer, you need Pap tests and screening for cancer for at least 20 years after your treatment. If Pap tests have been discontinued for you, your risk factors (such as having a new sexual partner) need to be reassessed to determine if you should start having screenings again. Some women have medical problems that increase the chance of getting cervical cancer. In these cases, your health care provider may recommend that you have screening and Pap tests more often.  If you have a family  history of uterine cancer or ovarian cancer, talk with your health care provider about genetic screening.  If you have vaginal bleeding after reaching menopause, tell your health care provider.  There are currently no reliable tests available to screen for ovarian cancer.  Lung Cancer Lung cancer screening is recommended for adults 79-73 years old who are at high risk for lung cancer because of a history of smoking. A yearly low-dose CT scan of the lungs is recommended if you:  Currently smoke.  Have a history of at least 30 pack-years of smoking and you currently smoke or have quit within the past 15 years. A pack-year is smoking an  average of one pack of cigarettes per day for one year.  Yearly screening should:  Continue until it has been 15 years since you quit.  Stop if you develop a health problem that would prevent you from having lung cancer treatment.  Colorectal Cancer  This type of cancer can be detected and can often be prevented.  Routine colorectal cancer screening usually begins at age 14 and continues through age 45.  If you have risk factors for colon cancer, your health care provider may recommend that you be screened at an earlier age.  If you have a family history of colorectal cancer, talk with your health care provider about genetic screening.  Your health care provider may also recommend using home test kits to check for hidden blood in your stool.  A small camera at the end of a tube can be used to examine your colon directly (sigmoidoscopy or colonoscopy). This is done to check for the earliest forms of colorectal cancer.  Direct examination of the colon should be repeated every 5-10 years until age 1. However, if early forms of precancerous polyps or small growths are found or if you have a family history or genetic risk for colorectal cancer, you may need to be screened more often.  Skin Cancer  Check your skin from head to toe regularly.  Monitor any moles. Be sure to tell your health care provider: ? About any new moles or changes in moles, especially if there is a change in a mole's shape or color. ? If you have a mole that is larger than the size of a pencil eraser.  If any of your family members has a history of skin cancer, especially at a young age, talk with your health care provider about genetic screening.  Always use sunscreen. Apply sunscreen liberally and repeatedly throughout the day.  Whenever you are outside, protect yourself by wearing long sleeves, pants, a wide-brimmed hat, and sunglasses.  What should I know about osteoporosis? Osteoporosis is a condition in  which bone destruction happens more quickly than new bone creation. After menopause, you may be at an increased risk for osteoporosis. To help prevent osteoporosis or the bone fractures that can happen because of osteoporosis, the following is recommended:  If you are 60-65 years old, get at least 1,000 mg of calcium and at least 600 mg of vitamin D per day.  If you are older than age 33 but younger than age 81, get at least 1,200 mg of calcium and at least 600 mg of vitamin D per day.  If you are older than age 40, get at least 1,200 mg of calcium and at least 800 mg of vitamin D per day.  Smoking and excessive alcohol intake increase the risk of osteoporosis. Eat foods that are rich in calcium and vitamin D,  and do weight-bearing exercises several times each week as directed by your health care provider. What should I know about how menopause affects my mental health? Depression may occur at any age, but it is more common as you become older. Common symptoms of depression include:  Low or sad mood.  Changes in sleep patterns.  Changes in appetite or eating patterns.  Feeling an overall lack of motivation or enjoyment of activities that you previously enjoyed.  Frequent crying spells.  Talk with your health care provider if you think that you are experiencing depression. What should I know about immunizations? It is important that you get and maintain your immunizations. These include:  Tetanus, diphtheria, and pertussis (Tdap) booster vaccine.  Influenza every year before the flu season begins.  Pneumonia vaccine.  Shingles vaccine.  Your health care provider may also recommend other immunizations. This information is not intended to replace advice given to you by your health care provider. Make sure you discuss any questions you have with your health care provider. Document Released: 03/28/2005 Document Revised: 08/24/2015 Document Reviewed: 11/07/2014 Elsevier Interactive  Patient Education  2018 [employer].

## 2017-07-09 NOTE — Progress Notes (Signed)
I have reviewed MWE by Ms. Vevelyn Royals and agree with her documentation  Denny Peon MD

## 2017-08-17 ENCOUNTER — Encounter: Payer: Self-pay | Admitting: Family Medicine

## 2017-08-19 ENCOUNTER — Other Ambulatory Visit: Payer: Self-pay | Admitting: Family Medicine

## 2017-08-21 ENCOUNTER — Inpatient Hospital Stay: Payer: Medicare Other | Attending: Internal Medicine

## 2017-08-21 ENCOUNTER — Ambulatory Visit (HOSPITAL_COMMUNITY)
Admission: RE | Admit: 2017-08-21 | Discharge: 2017-08-21 | Disposition: A | Discharge status: Home / Self Care | Payer: Medicare Other | Source: Ambulatory Visit | Attending: Internal Medicine | Admitting: Internal Medicine

## 2017-08-21 DIAGNOSIS — K449 Diaphragmatic hernia without obstruction or gangrene: Secondary | ICD-10-CM | POA: Insufficient documentation

## 2017-08-21 DIAGNOSIS — C3411 Malignant neoplasm of upper lobe, right bronchus or lung: Secondary | ICD-10-CM | POA: Insufficient documentation

## 2017-08-21 DIAGNOSIS — Z85118 Personal history of other malignant neoplasm of bronchus and lung: Secondary | ICD-10-CM | POA: Diagnosis not present

## 2017-08-21 DIAGNOSIS — J439 Emphysema, unspecified: Secondary | ICD-10-CM | POA: Insufficient documentation

## 2017-08-21 DIAGNOSIS — I700 Atherosclerosis of aorta: Secondary | ICD-10-CM | POA: Insufficient documentation

## 2017-08-21 LAB — COMPREHENSIVE METABOLIC PANEL
ALT: 11 U/L (ref 0–44)
AST: 25 U/L (ref 15–41)
Albumin: 3.9 g/dL (ref 3.5–5.0)
Alkaline Phosphatase: 73 U/L (ref 38–126)
Anion gap: 7 (ref 5–15)
BUN: 30 mg/dL — ABNORMAL HIGH (ref 8–23)
CO2: 26 mmol/L (ref 22–32)
Calcium: 9.5 mg/dL (ref 8.9–10.3)
Chloride: 98 mmol/L (ref 98–111)
Creatinine, Ser: 1.34 mg/dL — ABNORMAL HIGH (ref 0.44–1.00)
GFR, EST AFRICAN AMERICAN: 41 mL/min — AB (ref >60)
GFR, EST NON AFRICAN AMERICAN: 35 mL/min — AB (ref >60)
Glucose, Bld: 101 mg/dL — ABNORMAL HIGH (ref 70–99)
POTASSIUM: 5.2 mmol/L — AB (ref 3.5–5.1)
Sodium: 131 mmol/L — ABNORMAL LOW (ref 135–145)
TOTAL PROTEIN: 7.3 g/dL (ref 6.5–8.1)

## 2017-08-21 LAB — CBC WITH DIFFERENTIAL/PLATELET
BASOS ABS: 0 10*3/uL (ref 0.0–0.1)
BASOS PCT: 1 %
EOS ABS: 0.1 10*3/uL (ref 0.0–0.5)
EOS PCT: 1 %
HEMATOCRIT: 30.5 % — AB (ref 34.8–46.6)
Hemoglobin: 10.1 g/dL — ABNORMAL LOW (ref 11.6–15.9)
Lymphocytes Relative: 17 %
Lymphs Abs: 0.8 10*3/uL — ABNORMAL LOW (ref 0.9–3.3)
MCH: 27.3 pg (ref 25.1–34.0)
MCHC: 33.1 g/dL (ref 31.5–36.0)
MCV: 82.6 fL (ref 79.5–101.0)
MONO ABS: 0.5 10*3/uL (ref 0.1–0.9)
Monocytes Relative: 10 %
NEUTROS ABS: 3.4 10*3/uL (ref 1.5–6.5)
Neutrophils Relative %: 71 %
PLATELETS: 190 10*3/uL (ref 145–400)
RBC: 3.69 MIL/uL — ABNORMAL LOW (ref 3.70–5.45)
RDW: 14.3 % (ref 11.2–14.5)
WBC: 4.8 10*3/uL (ref 3.9–10.3)

## 2017-08-21 MED ORDER — IOHEXOL 300 MG/ML  SOLN
75.0000 mL | Freq: Once | INTRAMUSCULAR | Status: AC | PRN
  Administered 2017-08-21: 60 mL via INTRAVENOUS

## 2017-08-24 ENCOUNTER — Inpatient Hospital Stay: Payer: Medicare Other | Admitting: Internal Medicine

## 2017-08-24 ENCOUNTER — Encounter: Payer: Self-pay | Admitting: Internal Medicine

## 2017-08-24 ENCOUNTER — Telehealth: Payer: Self-pay

## 2017-08-24 VITALS — BP 147/77 | HR 80 | Temp 98.7°F | Resp 18 | Ht 62.0 in | Wt 106.6 lb

## 2017-08-24 DIAGNOSIS — C3411 Malignant neoplasm of upper lobe, right bronchus or lung: Secondary | ICD-10-CM | POA: Diagnosis not present

## 2017-08-24 DIAGNOSIS — C3490 Malignant neoplasm of unspecified part of unspecified bronchus or lung: Secondary | ICD-10-CM

## 2017-08-24 DIAGNOSIS — Z85118 Personal history of other malignant neoplasm of bronchus and lung: Secondary | ICD-10-CM | POA: Diagnosis not present

## 2017-08-24 NOTE — Telephone Encounter (Signed)
Printed avs and calender of upcoming appointment. Per 7/8 los

## 2017-08-24 NOTE — Progress Notes (Signed)
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  PRIOR THERAPY: Status post right upper lobectomy with wedge resection of the right middle lobe under the care of Dr. Servando Snare on 06/07/2013  CURRENT THERAPY: Observation.  INTERVAL HISTORY: [name] 84 y.o. female returns to the clinic today for six-month follow-up visit.  The patient is feeling fine today with no concerning complaints.  She denied having any chest pain, shortness of breath, cough or hemoptysis.  She denied having any nausea, vomiting, diarrhea or constipation.  She continues to have the intermittent back pain from scoliosis.  She is here today for evaluation with repeat CT scan of the chest for restaging of her disease.   MEDICAL HISTORY: Past Medical History:  Diagnosis Date  . Abnormal ECG 11/2008   left bbb  . Arthritis   . Back pain    scoliosis  . Cancer ([place]) 2015   lung  . Colon polyps   . Constipation    takes Colace daily  . Dry eyes    eye drops used   . GERD (gastroesophageal reflux disease)    takes Omeprazole daily  . Iron deficiency anemia   . Lupus ([place])    takes Plaquenil daily  . Osteoporosis   . PONV (postoperative nausea and vomiting)    likes phenergan  . Raynaud's phenomenon 1965  . Rheumatoid arthritis ([place])   . Sciatica   . Sjogren's syndrome ([place])     ALLERGIES:  is allergic to amlodipine; prochlorperazine edisylate; aspirin; and pamelor [nortriptyline hcl].  MEDICATIONS:  Current Outpatient Medications  Medication Sig Dispense Refill  . albuterol (PROVENTIL HFA;VENTOLIN HFA) 108 (90 Base) MCG/ACT inhaler Inhale 2 puffs into the lungs every 6 (six)  hours as needed for wheezing or shortness of breath. 1 Inhaler 0  . Biotin 1000 MCG tablet Take 1,000 mcg by mouth daily.     . Calcium Carbonate-Vitamin D (CALCIUM 500 + D) 500-125 MG-UNIT TABS Take 1 tablet by mouth daily.     Marland Kitchen docusate sodium (STOOL SOFTENER) 100 MG capsule Take 100 mg by mouth 2 (two) times daily.      Marland Kitchen estradiol (ESTRACE VAGINAL) 0.1 MG/GM vaginal cream USE AS DIRECTED 42.5 g 2  . furosemide (LASIX) 20 MG tablet Take 1 tablet (20 mg total) daily by mouth. 90 tablet 0  . gabapentin (NEURONTIN) 300 MG capsule TAKE '300MG'$  IN THE MORNING, '300MG'$  IN THE AFTERNOON, AND '600MG'$  AT BEDTIME. 120 capsule 5  . Glucosamine-Chondroit-Vit C-Mn (GLUCOSAMINE CHONDR 1500 COMPLX PO) Take 1 capsule by mouth daily.     Marland Kitchen HYDROcodone-acetaminophen (NORCO/VICODIN) 5-325 MG tablet Take 1 tablet by mouth every 12 (twelve) hours as needed. for pain  0  . levothyroxine (SYNTHROID, LEVOTHROID) 88 MCG tablet TAKE 1 TABLET (88 MCG TOTAL) BY MOUTH DAILY BEFORE BREAKFAST. 90 tablet 1  . meloxicam (MOBIC) 7.5 MG tablet Take 7.5 mg by mouth 2 (two) times daily as needed for pain.    . Multiple Vitamin (MULTIVITAMIN) tablet Take 1 tablet by mouth daily.      Marland Kitchen omeprazole (PRILOSEC) 20 MG capsule TAKE 1 CAPSULE BY MOUTH 2 TIMES DAILY BEFORE A MEAL. 180 capsule 1  .  spironolactone (ALDACTONE) 25 MG tablet Take 12.5 mg by mouth daily.   2   No current facility-administered medications for this visit.     SURGICAL HISTORY:  Past Surgical History:  Procedure Laterality Date  . APPENDECTOMY    . CARDIOVASCULAR STRESS TEST     12/2008 mild fixed basal to mid septal perfusion defect felt likely due to artifact from LBBB, no ischemia, EF 58%  . CATARACT EXTRACTION Bilateral   . COLONOSCOPY    . LYMPH NODE DISSECTION Right 06/07/2013   Procedure: LYMPH NODE DISSECTION;  Surgeon: [name], MD;  Location: [facility];  Service: Thoracic;  Laterality: Right;  . [place]  . THORACOTOMY   06/07/2013   Procedure: MINI/LIMITED THORACOTOMY; right middle lobe wedge resection;  Surgeon: [name], MD;  Location: [facility];  Service: Thoracic;;  . TONSILLECTOMY    . TOTAL ABDOMINAL HYSTERECTOMY  1980's   . TOTAL HIP ARTHROPLASTY Right 04/28/2014   Procedure: RIGHT TOTAL HIP ARTHROPLASTY ANTERIOR APPROACH;  Surgeon: [name], MD;  Location: [facility];  Service: Orthopedics;  Laterality: Right;  Marland Kitchen VIDEO ASSISTED THORACOSCOPY (VATS)/WEDGE RESECTION Right 06/07/2013   Procedure: VIDEO ASSISTED THORACOSCOPY (VATS)/right upper lobectomy, On Q;  Surgeon: [name], MD;  Location: [facility];  Service: Thoracic;  Laterality: Right;  Marland Kitchen VIDEO BRONCHOSCOPY N/A 06/07/2013   Procedure: VIDEO BRONCHOSCOPY;  Surgeon: [name], MD;  Location: [facility];  Service: Thoracic;  Laterality: N/A;  . VIDEO BRONCHOSCOPY WITH ENDOBRONCHIAL NAVIGATION N/A 05/04/2013   Procedure: VIDEO BRONCHOSCOPY WITH ENDOBRONCHIAL NAVIGATION;  Surgeon: [name], MD;  Location: [facility];  Service: Thoracic;  Laterality: N/A;    REVIEW OF SYSTEMS:  A comprehensive review of systems was negative except for: Musculoskeletal: positive for back pain   PHYSICAL EXAMINATION: General appearance: alert, cooperative and no distress Head: Normocephalic, without obvious abnormality, atraumatic Neck: no adenopathy, no JVD, supple, symmetrical, trachea midline and thyroid not enlarged, symmetric, no tenderness/mass/nodules Lymph nodes: Cervical, supraclavicular, and axillary nodes normal. Resp: clear to auscultation bilaterally Back: symmetric, no curvature. ROM normal. No CVA tenderness. Cardio: regular rate and rhythm, S1, S2 normal, no murmur, click, rub or gallop GI: soft, non-tender; bowel sounds normal; no masses,  no organomegaly Extremities: extremities normal, atraumatic, no cyanosis or edema  ECOG PERFORMANCE STATUS: 1 - Symptomatic but completely ambulatory  Blood pressure (!) 147/77, pulse 80,  temperature 98.7 F (37.1 C), temperature source Oral, resp. rate 18, height '5\' 2"'$  (1.575 m), weight 106 lb 9.6 oz (48.4 kg), SpO2 100 %.  LABORATORY DATA: Lab Results  Component Value Date   WBC 4.8 08/21/2017   HGB 10.1 (L) 08/21/2017   HCT 30.5 (L) 08/21/2017   MCV 82.6 08/21/2017   PLT 190 08/21/2017      Chemistry      Component Value Date/Time   NA 131 (L) 08/21/2017 1203   NA 130 (L) 08/21/2016 1125   K 5.2 (H) 08/21/2017 1203   K 4.8 08/21/2016 1125   CL 98 08/21/2017 1203   CO2 26 08/21/2017 1203   CO2 26 08/21/2016 1125   BUN 30 (H) 08/21/2017 1203   BUN 25.6 08/21/2016 1125   CREATININE 1.34 (H) 08/21/2017 1203   CREATININE 1.27 (H) 06/01/2017 1353   CREATININE 1.2 (H) 08/21/2016 1125      Component Value Date/Time   CALCIUM 9.5 08/21/2017 1203   CALCIUM 10.0 08/21/2016 1125   ALKPHOS 73 08/21/2017 1203   ALKPHOS 54 08/21/2016  1125   AST 25 08/21/2017 1203   AST 27 08/21/2016 1125   ALT 11 08/21/2017 1203   ALT 13 08/21/2016 1125   BILITOT <0.2 (L) 08/21/2017 1203   BILITOT 0.31 08/21/2016 1125       RADIOGRAPHIC STUDIES: Ct Chest W Contrast  Result Date: 08/21/2017 CLINICAL DATA:  Right lung cancer. EXAM: CT CHEST WITH CONTRAST TECHNIQUE: Multidetector CT imaging of the chest was performed during intravenous contrast administration. CONTRAST:  41m OMNIPAQUE IOHEXOL 300 MG/ML  SOLN COMPARISON:  08/21/2016 FINDINGS: Cardiovascular: The heart size is normal. No substantial pericardial effusion. Atherosclerotic calcification is noted in the wall of the thoracic aorta. Mediastinum/Nodes: No mediastinal lymphadenopathy. There is no hilar lymphadenopathy. There is no axillary lymphadenopathy. Esophagus unremarkable. Moderate hiatal hernia evident. Lungs/Pleura: Biapical pleuroparenchymal scarring evident. Status post right upper lobectomy. Stable appearance of staple line in the medial right lung. Centrilobular emphysema again noted. No suspicious pulmonary nodule  or mass. No pleural effusion. Stable scarring in the lingula. Upper Abdomen: Unremarkable Musculoskeletal: Scoliosis. Degenerative changes noted in both shoulders. No worrisome lytic or sclerotic osseous abnormality. IMPRESSION: 1. Stable exam.  No new or progressive interval findings. 2. Moderate hiatal hernia 3.  Aortic Atherosclerois (ICD10-170.0) 4.  Emphysema. (([id number]) Electronically Signed   By: EMisty StanleyM.D.   On: 08/21/2017 15:03   ASSESSMENT AND PLAN:  This is a very pleasant 84years old white female with stage II a non-small cell lung cancer, adenocarcinoma with positive EGFR mutation in exon 21 status post right upper lobectomy as well as wedge resection of the right middle lobe and the patient declined adjuvant systemic chemotherapy. The patient has been in observation since her surgery. She is feeling fine today and repeat CT scan of the chest showed no concerning findings for disease recurrence. I discussed the scan results with the patient and recommended for her to continue in observation with repeat CT scan of the chest in 6 months. She was advised to call immediately if she has any concerning symptoms in the interval. The patient voices understanding of current disease status and treatment options and is in agreement with the current care plan. All questions were answered. The patient knows to call the clinic with any problems, questions or concerns. We can certainly see the patient much sooner if necessary. I spent 10 minutes counseling the patient face to face. The total time spent in the appointment was 15 minutes. Disclaimer: This note was dictated with voice recognition software. Similar sounding words can inadvertently be transcribed and may not be corrected upon review.

## 2017-10-15 ENCOUNTER — Ambulatory Visit: Payer: Medicare Other | Admitting: Cardiothoracic Surgery

## 2017-10-15 ENCOUNTER — Encounter: Payer: Self-pay | Admitting: Cardiothoracic Surgery

## 2017-10-15 ENCOUNTER — Other Ambulatory Visit: Payer: Self-pay

## 2017-10-15 VITALS — BP 142/62 | HR 83 | Resp 18 | Ht 62.0 in | Wt 105.0 lb

## 2017-10-15 DIAGNOSIS — C3411 Malignant neoplasm of upper lobe, right bronchus or lung: Secondary | ICD-10-CM

## 2017-10-15 NOTE — Progress Notes (Signed)
[facility] Record #[id number] Date of Birth: [birth year]  Referring SW:NIOEVO, Aundra Millet, MD Primary Cardiology: Primary Care:Copland, Gay Filler, MD  Chief Complaint:  Follow Up Visit  Surgery: 06/07/2013   OPERATIVE REPORT  PREOPERATIVE DIAGNOSIS: Right upper lobe adenocarcinoma of the lung.  POSTOPERATIVE DIAGNOSIS: Right upper lobe adenocarcinoma of the lung.  SURGICAL PROCEDURES: Video bronchoscopy, right video-assisted  thoracoscopy, mini-thoracotomy, right upper lobectomy with lymph node  dissection, wedge resection of right middle lobe, placement of On-Q.  SURGEON: Lanelle Bal, MD.  Lung cancer, Right upper lobe   Primary site: Lung (Right)   Staging method: AJCC 7th Edition   Clinical: Stage IIA (T2b, N0, M0) signed by [name], MD on 05/08/2013 10:00 PM   Pathologic: Stage IIA (T2b, N0, cM0) signed by [name], MD on 06/13/2013 10:49 AM   Summary: Stage IIA (T2b, N0, cM0)  Stage IIA (T2b, N0, M0) non-small cell lung cancer, adenocarcinoma with positive EGFR mutation in exon 21 ([id number]) presented with right upper lobe lung mass   History of Present Illness:     Patient presents for follow-up after right upper lobectomy with lymph node dissection for a stage II a non-small cell carcinoma the lung resected 06/07/2013.  She has no new complaints CT scan of the chest was done in July 2019  Zubrod Score: At the time of surgery this patient's most appropriate activity status/level should be described as: '[]'$     0    Normal activity, no symptoms '[x]'$     1    Restricted in physical strenuous activity but ambulatory, able to do out light work '[]'$     2    Ambulatory and capable of self care, unable to do work activities, up and about                 >50 % of waking hours                                                                                   '[]'$     3    Only limited self care, in bed greater than 50% of  waking hours '[]'$     4    Completely disabled, no self care, confined to bed or chair '[]'$     5    Moribund  Social History   Tobacco Use  Smoking Status Never Smoker  Smokeless Tobacco Never Used       Allergies  Allergen Reactions  . Amlodipine Rash  . Prochlorperazine Edisylate Anaphylaxis    Compazine  . Aspirin     REACTION: nose bleeds  . Pamelor [Nortriptyline Hcl] Other (See Comments)    Increased Heart rate and BP    Current Outpatient Medications  Medication Sig Dispense Refill  . albuterol (PROVENTIL HFA;VENTOLIN HFA) 108 (90 Base) MCG/ACT inhaler Inhale 2 puffs into the lungs every 6 (six) hours as needed for wheezing or shortness of breath. 1 Inhaler 0  . Biotin 1000 MCG tablet Take 1,000 mcg by mouth daily.     . Calcium  Carbonate-Vitamin D (CALCIUM 500 + D) 500-125 MG-UNIT TABS Take 1 tablet by mouth daily.     Marland Kitchen docusate sodium (STOOL SOFTENER) 100 MG capsule Take 100 mg by mouth 2 (two) times daily.      Marland Kitchen estradiol (ESTRACE VAGINAL) 0.1 MG/GM vaginal cream USE AS DIRECTED 42.5 g 2  . furosemide (LASIX) 20 MG tablet Take 1 tablet (20 mg total) daily by mouth. 90 tablet 0  . gabapentin (NEURONTIN) 300 MG capsule TAKE '300MG'$  IN THE MORNING, '300MG'$  IN THE AFTERNOON, AND '600MG'$  AT BEDTIME. 120 capsule 5  . Glucosamine-Chondroit-Vit C-Mn (GLUCOSAMINE CHONDR 1500 COMPLX PO) Take 1 capsule by mouth daily.     Marland Kitchen HYDROcodone-acetaminophen (NORCO/VICODIN) 5-325 MG tablet Take 1 tablet by mouth every 12 (twelve) hours as needed. for pain  0  . levothyroxine (SYNTHROID, LEVOTHROID) 88 MCG tablet TAKE 1 TABLET (88 MCG TOTAL) BY MOUTH DAILY BEFORE BREAKFAST. 90 tablet 1  . meloxicam (MOBIC) 7.5 MG tablet Take 7.5 mg by mouth 2 (two) times daily as needed for pain.    . Multiple Vitamin (MULTIVITAMIN) tablet Take 1 tablet by mouth daily.      Marland Kitchen omeprazole (PRILOSEC) 20 MG capsule TAKE 1 CAPSULE BY MOUTH 2 TIMES DAILY BEFORE A MEAL. 180 capsule 1  . spironolactone (ALDACTONE) 25 MG  tablet Take 12.5 mg by mouth daily.   2   No current facility-administered medications for this visit.      Physical Exam: BP (!) 142/62 (BP Location: Right Arm, Patient Position: Sitting, Cuff Size: Small)   Pulse 83   Resp 18   Ht '5\' 2"'$  (1.575 m)   Wt 105 lb (47.6 kg)   SpO2 98% Comment: RA  BMI 19.20 kg/m    General appearance: alert, cooperative, appears stated age and no distress Head: Normocephalic, without obvious abnormality, atraumatic Neck: no adenopathy, no carotid bruit, no JVD, supple, symmetrical, trachea midline and thyroid not enlarged, symmetric, no tenderness/mass/nodules Lymph nodes: Cervical, supraclavicular, and axillary nodes normal. Resp: clear to auscultation bilaterally Cardio: regular rate and rhythm, S1, S2 normal, no murmur, click, rub or gallop GI: soft, non-tender; bowel sounds normal; no masses,  no organomegaly Extremities: extremities normal, atraumatic, no cyanosis or edema and Homans sign is negative, no sign of DVT Neurologic: Grossly normal       Diagnostic Studies & Laboratory data:         Recent Radiology Findings:  CLINICAL DATA:  Right lung cancer.  EXAM: CT CHEST WITH CONTRAST  TECHNIQUE: Multidetector CT imaging of the chest was performed during intravenous contrast administration.  CONTRAST:  22m OMNIPAQUE IOHEXOL 300 MG/ML  SOLN  COMPARISON:  08/21/2016  FINDINGS: Cardiovascular: The heart size is normal. No substantial pericardial effusion. Atherosclerotic calcification is noted in the wall of the thoracic aorta.  Mediastinum/Nodes: No mediastinal lymphadenopathy. There is no hilar lymphadenopathy. There is no axillary lymphadenopathy. Esophagus unremarkable. Moderate hiatal hernia evident.  Lungs/Pleura: Biapical pleuroparenchymal scarring evident. Status post right upper lobectomy. Stable appearance of staple line in the medial right lung. Centrilobular emphysema again noted. No suspicious pulmonary  nodule or mass. No pleural effusion. Stable scarring in the lingula.  Upper Abdomen: Unremarkable  Musculoskeletal: Scoliosis. Degenerative changes noted in both shoulders. No worrisome lytic or sclerotic osseous abnormality.  IMPRESSION: 1. Stable exam.  No new or progressive interval findings. 2. Moderate hiatal hernia 3.  Aortic Atherosclerois (ICD10-170.0) 4.  Emphysema. (([id number])   Electronically Signed   By: EMisty StanleyM.D.   On: 08/21/2017 15:03  CLINICAL DATA:  Lung cancer  EXAM: CT CHEST WITH CONTRAST  TECHNIQUE: Multidetector CT imaging of the chest was performed during intravenous contrast administration.  CONTRAST:  46m ISOVUE-300 IOPAMIDOL (ISOVUE-300) INJECTION 61%  COMPARISON:  02/21/2016  FINDINGS: Cardiovascular: The heart size is normal.  No pericardial effusion.  Mediastinum/Nodes: No mediastinal lymphadenopathy. There is no hilar lymphadenopathy. Somewhat patulous appearance esophagus is stable. Small hiatal hernia again noted. There is no axillary lymphadenopathy.  Lungs/Pleura: Postsurgical change and scarring in the right apex is similar to prior. 4 mm nodular component seen image 37 series 5 is stable. Surgical changes in the right hilum and pulmonary venous anatomy is compatible with the reported clinical history of previous right upper lobectomy. 3 mm left upper lobe pulmonary nodule (image 39 series 5) is stable. No focal airspace consolidation. No pulmonary edema or pleural effusion.  Upper Abdomen: Tiny hypervascular focus right liver is unchanged.  Musculoskeletal: Degenerative changes are noted in the shoulders. Bone windows reveal no worrisome lytic or sclerotic osseous lesions.  IMPRESSION: 1. Stable exam.  No new or progressive findings. 2. Small hiatal hernia.   Electronically Signed   By: EMisty StanleyM.D.   On: 08/21/2016 14:01 I have independently reviewed the above radiology studies  and  reviewed the findings with the patient.    Recent Labs: Lab Results  Component Value Date   WBC 4.8 08/21/2017   HGB 10.1 (L) 08/21/2017   HCT 30.5 (L) 08/21/2017   PLT 190 08/21/2017   GLUCOSE 101 (H) 08/21/2017   CHOL 177 07/02/2016   TRIG 117.0 07/02/2016   HDL 66.60 07/02/2016   LDLDIRECT 100.9 08/13/2011   LDLCALC 87 07/02/2016   ALT 11 08/21/2017   AST 25 08/21/2017   NA 131 (L) 08/21/2017   K 5.2 (H) 08/21/2017   CL 98 08/21/2017   CREATININE 1.34 (H) 08/21/2017   BUN 30 (H) 08/21/2017   CO2 26 08/21/2017   TSH 0.63 04/09/2017   INR 0.97 04/21/2014      Assessment / Plan:     Stable left upper lobe nodule, no new findings to suggest recurrent carcinoma of the lung following right lung resection without evidence of recurrence by CT under observational treatment currently. Now 4  years post op   CT scan in 144monthordered by oncology by Oncology ,   I will see the patient back as needed if further developments appear on her scan.     EdGrace IsaacD      [address] ,[place] [phone] [place] [phone]

## 2017-10-26 ENCOUNTER — Ambulatory Visit (INDEPENDENT_AMBULATORY_CARE_PROVIDER_SITE_OTHER): Payer: Self-pay | Admitting: Orthopaedic Surgery

## 2017-10-28 ENCOUNTER — Ambulatory Visit (INDEPENDENT_AMBULATORY_CARE_PROVIDER_SITE_OTHER): Payer: Self-pay

## 2017-10-28 ENCOUNTER — Ambulatory Visit (INDEPENDENT_AMBULATORY_CARE_PROVIDER_SITE_OTHER): Payer: Medicare Other | Admitting: Physician Assistant

## 2017-10-28 ENCOUNTER — Encounter (INDEPENDENT_AMBULATORY_CARE_PROVIDER_SITE_OTHER): Payer: Self-pay | Admitting: Physician Assistant

## 2017-10-28 DIAGNOSIS — T84018A Broken internal joint prosthesis, other site, initial encounter: Secondary | ICD-10-CM

## 2017-10-28 DIAGNOSIS — M25551 Pain in right hip: Secondary | ICD-10-CM

## 2017-10-28 DIAGNOSIS — Z96649 Presence of unspecified artificial hip joint: Secondary | ICD-10-CM | POA: Diagnosis not present

## 2017-10-28 NOTE — Progress Notes (Signed)
Office Visit Note   Patient: [name]           Date of Birth: 01/28/1931           MRN: [id number] Visit Date: 10/28/2017              Requested by: Darreld Mclean, MD [address] [place], [place] [address] PCP: Darreld Mclean, MD   Assessment & Plan: Visit Diagnoses:  1. Pain in right hip   2. Prosthetic hip implant failure, initial encounter [name])     Plan: Due to the failure of her acetabular component she will require a right hip acetabular revision.  We will proceed with this in the near future.  Questions were encouraged and answered by Dr. Ninfa Linden myself today.  She will follow-up 2 weeks postop.  Follow-Up Instructions: Return for 2 weeks postop.   Orders:  Orders Placed This Encounter  Procedures  . XR HIP UNILAT W OR W/O PELVIS 2-3 VIEWS RIGHT   No orders of the defined types were placed in this encounter.     Procedures: No procedures performed   Clinical Data: No additional findings.   Subjective: Chief Complaint  Patient presents with  . Right Hip - Pain, Follow-up    HPI [name] comes in today with right hip pain.  She status post a right total hip arthroplasty 04/28/2014 by Dr. Ninfa Linden.  She had done well until about a year ago and started having increasing right hip and groin pain she is having some back pain is been seen by Dr. Patrice Paradise for this receiving epidural steroid injections.  They performed some x-rays 2 to 3 weeks ago and found that her hip cup appeared abnormal.  She states that she is began having walk with a cane.  She said no falls no injuries.  She has trouble going from a sitting to standing position.  She states sitting though is that makes the pain worse in the right hip groin area.  She denies any fevers chills shortness of breath.  She does have Sjogren's syndrome rheumatoid arthritis and lupus.  Review of Systems  Constitutional: Negative for chills and fever.  Respiratory: Negative for  shortness of breath.   Cardiovascular: Negative for chest pain.  Musculoskeletal: Positive for back pain.     Objective: Vital Signs: There were no vitals taken for this visit.  Physical Exam  Constitutional: She is oriented to person, place, and time. She appears well-developed and well-nourished. No distress.  Pulmonary/Chest: Effort normal.  Neurological: She is alert and oriented to person, place, and time.  Skin: She is not diaphoretic.    Ortho Exam Right hip surgical incisions well-healed no signs of infection.  Right hip with minimal internal/external rotation and discomfort.  Right calf supple nontender. Specialty Comments:  No specialty comments available.  Imaging: Xr Hip Unilat W Or W/o Pelvis 2-3 Views Right  Result Date: 10/28/2017 AP pelvis lateral view of the right hip show the acetabular component has failed.  The cup is vertical and is migrated superiorly.  Unable to visualize the screw that was present within the cup.  Femoral component appears well seated.  There is no evidence of acute fracture or bony abnormality otherwise.    PMFS History: Patient Active Problem List   Diagnosis Date Noted  . Elevated cholesterol 10/11/2015  . 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 Bilateral leg edema 08/01/2014  . Elevated BP 08/01/2014  . Dehydration 08/01/2014  .  Rheumatoid arthritis involving multiple joints ([place]) 05/30/2014  . Osteoarthritis of right hip 04/28/2014  . Status post total replacement of right hip 04/28/2014  . Long-term use of high-risk medication 11/11/2013  . Routine general medical examination at a health care facility 11/11/2013  . Anemia, unspecified 11/11/2013  . Neuropathic pain 07/07/2013  . Constipation due to pain medication 07/07/2013  . Protein-calorie malnutrition, severe ([place]) 06/08/2013  . Lung cancer, Right upper lobe 05/08/2013  . Anterior chest wall pain 09/18/2011  . Breast lump 09/18/2011  . Sciatica of right side  08/13/2011  . Sinusitis 06/13/2011  . Cerumen impaction 08/20/2010  . SINUSITIS - ACUTE-NOS 04/30/2010  . Osteopenia 03/07/2010  . PARESTHESIA 03/07/2010  . CONTUSION OF LOWER LEG 11/29/2009  . DIZZINESS 10/01/2009  . Backache 03/16/2009  . CT, CHEST, ABNORMAL 12/18/2008  . ABNORMAL ECHOCARDIOGRAM 12/14/2008  . URI 11/29/2008  . [place] SYNDROME 11/29/2008  . CONTACT DERMATITIS 11/02/2006  . HYPOGLYCEMIA 06/29/2006  . RAYNAUD'S DISEASE 06/29/2006   Past Medical History:  Diagnosis Date  . Abnormal ECG 11/2008   left bbb  . Arthritis   . Back pain    scoliosis  . Cancer ([place]) 2015   lung  . Colon polyps   . Constipation    takes Colace daily  . Dry eyes    eye drops used   . GERD (gastroesophageal reflux disease)    takes Omeprazole daily  . Iron deficiency anemia   . Lupus ([place])    takes Plaquenil daily  . Osteoporosis   . PONV (postoperative nausea and vomiting)    likes phenergan  . Raynaud's phenomenon 1965  . Rheumatoid arthritis ([place])   . Sciatica   . Sjogren's syndrome ([place])     Family History  Problem Relation Age of Onset  . Coronary artery disease Father   . Colon cancer Father   . Diabetes Father   . Cancer Father        colon  . Other Mother 60       MVA  . Healthy Sister   . Other Brother        Killed on war  . Healthy Daughter   . Esophageal cancer Neg Hx   . Kidney disease Neg Hx   . Liver disease Neg Hx     Past Surgical History:  Procedure Laterality Date  . APPENDECTOMY    . CARDIOVASCULAR STRESS TEST     12/2008 mild fixed basal to mid septal perfusion defect felt likely due to artifact from LBBB, no ischemia, EF 58%  . CATARACT EXTRACTION Bilateral   . COLONOSCOPY    . LYMPH NODE DISSECTION Right 06/07/2013   Procedure: LYMPH NODE DISSECTION;  Surgeon: [name], MD;  Location: [facility];  Service: Thoracic;  Laterality: Right;  . [place]  . THORACOTOMY  06/07/2013   Procedure: MINI/LIMITED  THORACOTOMY; right middle lobe wedge resection;  Surgeon: [name], MD;  Location: [facility];  Service: Thoracic;;  . TONSILLECTOMY    . TOTAL ABDOMINAL HYSTERECTOMY  1980's   . TOTAL HIP ARTHROPLASTY Right 04/28/2014   Procedure: RIGHT TOTAL HIP ARTHROPLASTY ANTERIOR APPROACH;  Surgeon: [name], MD;  Location: [facility];  Service: Orthopedics;  Laterality: Right;  Marland Kitchen VIDEO ASSISTED THORACOSCOPY (VATS)/WEDGE RESECTION Right 06/07/2013   Procedure: VIDEO ASSISTED THORACOSCOPY (VATS)/right upper lobectomy, On Q;  Surgeon: [name], MD;  Location: [facility];  Service: Thoracic;  Laterality: Right;  Marland Kitchen VIDEO BRONCHOSCOPY N/A 06/07/2013  Procedure: VIDEO BRONCHOSCOPY;  Surgeon: [name], MD;  [facility];  Service: Thoracic;  Laterality: N/A;  . VIDEO BRONCHOSCOPY WITH ENDOBRONCHIAL NAVIGATION N/A 05/04/2013   Procedure: VIDEO BRONCHOSCOPY WITH ENDOBRONCHIAL NAVIGATION;  Surgeon: [name], MD;  Location: [facility];  Service: Thoracic;  Laterality: N/A;   Social History   Occupational History  . Occupation: n/a  Tobacco Use  . Smoking status: Never Smoker  . Smokeless tobacco: Never Used  Substance and Sexual Activity  . Alcohol use: Not Currently    Frequency: Never    Comment: rare/social  . Drug use: No  . Sexual activity: Not Currently

## 2017-11-02 ENCOUNTER — Telehealth (INDEPENDENT_AMBULATORY_CARE_PROVIDER_SITE_OTHER): Payer: Self-pay

## 2017-11-02 NOTE — Telephone Encounter (Signed)
Per Artis Delay, I left her a message to swing by at anytime to get a pelvis xray laying down

## 2017-11-03 ENCOUNTER — Other Ambulatory Visit: Payer: Self-pay | Admitting: Family Medicine

## 2017-11-05 ENCOUNTER — Ambulatory Visit (INDEPENDENT_AMBULATORY_CARE_PROVIDER_SITE_OTHER): Payer: Medicare Other

## 2017-11-05 ENCOUNTER — Encounter (INDEPENDENT_AMBULATORY_CARE_PROVIDER_SITE_OTHER): Payer: Self-pay | Admitting: Orthopaedic Surgery

## 2017-11-05 ENCOUNTER — Ambulatory Visit (INDEPENDENT_AMBULATORY_CARE_PROVIDER_SITE_OTHER): Payer: Medicare Other | Admitting: Physician Assistant

## 2017-11-05 DIAGNOSIS — M25551 Pain in right hip: Secondary | ICD-10-CM

## 2017-11-09 NOTE — Progress Notes (Signed)
Right hip xray today only

## 2017-11-13 ENCOUNTER — Encounter: Payer: Self-pay | Admitting: Family Medicine

## 2017-11-17 ENCOUNTER — Other Ambulatory Visit (INDEPENDENT_AMBULATORY_CARE_PROVIDER_SITE_OTHER): Payer: Self-pay | Admitting: Physician Assistant

## 2017-11-18 ENCOUNTER — Other Ambulatory Visit: Payer: Self-pay

## 2017-11-18 ENCOUNTER — Encounter (HOSPITAL_COMMUNITY)
Admission: RE | Admit: 2017-11-18 | Discharge: 2017-11-18 | Disposition: A | Discharge status: Home / Self Care | Payer: Medicare Other | Source: Ambulatory Visit | Attending: Orthopaedic Surgery | Admitting: Orthopaedic Surgery

## 2017-11-18 ENCOUNTER — Encounter (HOSPITAL_COMMUNITY): Payer: Self-pay

## 2017-11-18 DIAGNOSIS — Z01818 Encounter for other preprocedural examination: Secondary | ICD-10-CM | POA: Diagnosis present

## 2017-11-18 DIAGNOSIS — I447 Left bundle-branch block, unspecified: Secondary | ICD-10-CM | POA: Diagnosis not present

## 2017-11-18 HISTORY — DX: Personal history of diseases of the blood and blood-forming organs and certain disorders involving the immune mechanism: Z86.2

## 2017-11-18 HISTORY — DX: Hypothyroidism, unspecified: E03.9

## 2017-11-18 HISTORY — DX: Scoliosis, unspecified: M41.9

## 2017-11-18 HISTORY — DX: Malignant neoplasm of unspecified part of right bronchus or lung: C34.91

## 2017-11-18 HISTORY — DX: Personal history of colonic polyps: Z86.010

## 2017-11-18 HISTORY — DX: Disorder of kidney and ureter, unspecified: N28.9

## 2017-11-18 HISTORY — DX: Unspecified osteoarthritis, unspecified site: M19.90

## 2017-11-18 HISTORY — DX: Personal history of diseases of the skin and subcutaneous tissue: Z87.2

## 2017-11-18 HISTORY — DX: Other specified arthritis, unspecified site: M13.80

## 2017-11-18 HISTORY — DX: Nocturia: R35.1

## 2017-11-18 HISTORY — DX: Erythromelalgia: I73.81

## 2017-11-18 HISTORY — DX: Pain in unspecified joint: M25.50

## 2017-11-18 HISTORY — DX: Other constipation: K59.09

## 2017-11-18 HISTORY — DX: Diaphragmatic hernia without obstruction or gangrene: K44.9

## 2017-11-18 HISTORY — DX: Left bundle-branch block, unspecified: I44.7

## 2017-11-18 HISTORY — DX: Personal history of colon polyps, unspecified: Z86.0100

## 2017-11-18 LAB — CBC
HCT: 31.8 % — ABNORMAL LOW (ref 36.0–46.0)
HEMOGLOBIN: 10.2 g/dL — AB (ref 12.0–15.0)
MCH: 26.7 pg (ref 26.0–34.0)
MCHC: 32.1 g/dL (ref 30.0–36.0)
MCV: 83.2 fL (ref 78.0–100.0)
PLATELETS: 226 10*3/uL (ref 150–400)
RBC: 3.82 MIL/uL — AB (ref 3.87–5.11)
RDW: 14.6 % (ref 11.5–15.5)
WBC: 4.7 10*3/uL (ref 4.0–10.5)

## 2017-11-18 LAB — BASIC METABOLIC PANEL
Anion gap: 7 (ref 5–15)
BUN: 25 mg/dL — ABNORMAL HIGH (ref 8–23)
CHLORIDE: 97 mmol/L — AB (ref 98–111)
CO2: 27 mmol/L (ref 22–32)
CREATININE: 1.1 mg/dL — AB (ref 0.44–1.00)
Calcium: 9 mg/dL (ref 8.9–10.3)
GFR calc non Af Amer: 44 mL/min — ABNORMAL LOW (ref >60)
GFR, EST AFRICAN AMERICAN: 52 mL/min — AB (ref >60)
Glucose, Bld: 99 mg/dL (ref 70–99)
Potassium: 4.6 mmol/L (ref 3.5–5.1)
SODIUM: 131 mmol/L — AB (ref 135–145)

## 2017-11-18 NOTE — Progress Notes (Addendum)
Final EKG dated 11-18-2017 in epic. Routed BMP result dated 11-18-2017 to dr Harrell Gave blackman in epic.  ADDENDUM:  Positive PCR screen result 11-18-2017 sent to dr Gerald Stabs blackman in epic.

## 2017-11-18 NOTE — Patient Instructions (Addendum)
[name]  Mar 25, 1930    Your procedure is scheduled on:   Friday 11/27/2017   Report to [facility] Main  Entrance,  Report to admitting at  06:45  AM    Call this number if you have problems the morning of surgery [phone]    Remember: Do not eat food or drink liquids :After Midnight.             BRUSH YOUR TEETH MORNING OF SURGERY AND RINSE YOUR MOUTH OUT, NO CHEWING GUM CANDY OR MINTS.              Takes these medications am day of surgery with sips of water:  Gabapentin, Omeprazole (prilosec), Docusate                                 You may not have any metal on your body including hair pins and              piercings                Do not wear jewelry, make-up, lotions, powders or perfumes, deodorant                          Do not wear nail polish.  Do not shave  48 hours prior to surgery.              Do not bring valuables to the hospital. [place].  Contacts, dentures or bridgework may not be worn into surgery.  Leave suitcase in the car. After surgery it may be brought to your room.                  Please read over the following fact sheets you were given: _____________________________________________________________________             [facility] - Preparing for Surgery Before surgery, you can play an important role.  Because skin is not sterile, your skin needs to be as free of germs as possible.  You can reduce the number of germs on your skin by washing with CHG (chlorahexidine gluconate) soap before surgery.  CHG is an antiseptic cleaner which kills germs and bonds with the skin to continue killing germs even after washing. Please DO NOT use if you have an allergy to CHG or antibacterial soaps.  If your skin becomes reddened/irritated stop using the CHG and inform your nurse when you arrive at Short Stay. Do not shave (including legs and underarms) for at least 48 hours  prior to the first CHG shower.  You may shave your face/neck. Please follow these instructions carefully:  1.  Shower with CHG Soap the night before surgery and the  morning of Surgery.  2.  If you choose to wash your hair, wash your hair first as usual with your  normal  shampoo.  3.  After you shampoo, rinse your hair and body thoroughly to remove the  shampoo.                                       4.  Use  CHG as you would any other liquid soap.  You can apply chg directly  to the skin and wash                       Gently with a scrungie or clean washcloth.  5.  Apply the CHG Soap to your body ONLY FROM THE NECK DOWN.   Do not use on face/ open                           Wound or open sores. Avoid contact with eyes, ears mouth and genitals (private parts).                       Wash face,  Genitals (private parts) with your normal soap.             6.  Wash thoroughly, paying special attention to the area where your surgery  will be performed.  7.  Thoroughly rinse your body with warm water from the neck down.  8.  DO NOT shower/wash with your normal soap after using and rinsing off  the CHG Soap.             9.  Pat yourself dry with a clean towel.            10.  Wear clean pajamas.            11.  Place clean sheets on your bed the night of your first shower and do not  sleep with pets. Day of Surgery : Do not apply any lotions/deodorants the morning of surgery.  Please wear clean clothes to the hospital/surgery center.  FAILURE TO FOLLOW THESE INSTRUCTIONS MAY RESULT IN THE CANCELLATION OF YOUR SURGERY PATIENT SIGNATURE_________________________________  NURSE SIGNATURE__________________________________  ________________________________________________________________________  WHAT IS A BLOOD TRANSFUSION? Blood Transfusion Information  A transfusion is the replacement of blood or some of its parts. Blood is made up of multiple cells which provide different functions.  Red blood  cells carry oxygen and are used for blood loss replacement.  White blood cells fight against infection.  Platelets control bleeding.  Plasma helps clot blood.  Other blood products are available for specialized needs, such as hemophilia or other clotting disorders. BEFORE THE TRANSFUSION  Who gives blood for transfusions?   Healthy volunteers who are fully evaluated to make sure their blood is safe. This is blood bank blood. Transfusion therapy is the safest it has ever been in the practice of medicine. Before blood is taken from a donor, a complete history is taken to make sure that person has no history of diseases nor engages in risky social behavior (examples are intravenous drug use or sexual activity with multiple partners). The donor's travel history is screened to minimize risk of transmitting infections, such as malaria. The donated blood is tested for signs of infectious diseases, such as HIV and hepatitis. The blood is then tested to be sure it is compatible with you in order to minimize the chance of a transfusion reaction. If you or a relative donates blood, this is often done in anticipation of surgery and is not appropriate for emergency situations. It takes many days to process the donated blood. RISKS AND COMPLICATIONS Although transfusion therapy is very safe and saves many lives, the main dangers of transfusion include:   Getting an infectious disease.  Developing a transfusion reaction. This is an allergic reaction to something  in the blood you were given. Every precaution is taken to prevent this. The decision to have a blood transfusion has been considered carefully by your caregiver before blood is given. Blood is not given unless the benefits outweigh the risks. AFTER THE TRANSFUSION  Right after receiving a blood transfusion, you will usually feel much better and more energetic. This is especially true if your red blood cells have gotten low (anemic). The transfusion  raises the level of the red blood cells which carry oxygen, and this usually causes an energy increase.  The nurse administering the transfusion will monitor you carefully for complications. HOME CARE INSTRUCTIONS  No special instructions are needed after a transfusion. You may find your energy is better. Speak with your caregiver about any limitations on activity for underlying diseases you may have. SEEK MEDICAL CARE IF:   Your condition is not improving after your transfusion.  You develop redness or irritation at the intravenous (IV) site. SEEK IMMEDIATE MEDICAL CARE IF:  Any of the following symptoms occur over the next 12 hours:  Shaking chills.  You have a temperature by mouth above 102 F (38.9 C), not controlled by medicine.  Chest, back, or muscle pain.  People around you feel you are not acting correctly or are confused.  Shortness of breath or difficulty breathing.  Dizziness and fainting.  You get a rash or develop hives.  You have a decrease in urine output.  Your urine turns a dark color or changes to pink, red, or brown. Any of the following symptoms occur over the next 10 days:  You have a temperature by mouth above 102 F (38.9 C), not controlled by medicine.  Shortness of breath.  Weakness after normal activity.  The white part of the eye turns yellow (jaundice).  You have a decrease in the amount of urine or are urinating less often.  Your urine turns a dark color or changes to pink, red, or brown. Document Released: 02/01/2000 Document Revised: 04/28/2011 Document Reviewed: 09/20/2007 ExitCare Patient Information 2014 [place].     ______________________________________________Incentive Spirometer  An incentive spirometer is a tool that can help keep your lungs clear and active. This tool measures how well you are filling your lungs with each breath. Taking long deep breaths may help reverse or decrease the chance of developing  breathing (pulmonary) problems (especially infection) following:  A long period of time when you are unable to move or be active. BEFORE THE PROCEDURE   If the spirometer includes an indicator to show your best effort, your nurse or respiratory therapist will set it to a desired goal.  If possible, sit up straight or lean slightly forward. Try not to slouch.  Hold the incentive spirometer in an upright position. INSTRUCTIONS FOR USE  1. Sit on the edge of your bed if possible, or sit up as far as you can in bed or on a chair. 2. Hold the incentive spirometer in an upright position. 3. Breathe out normally. 4. Place the mouthpiece in your mouth and seal your lips tightly around it. 5. Breathe in slowly and as deeply as possible, raising the piston or the ball toward the top of the column. 6. Hold your breath for 3-5 seconds or for as long as possible. Allow the piston or ball to fall to the bottom of the column. 7. Remove the mouthpiece from your mouth and breathe out normally. 8. Rest for a few seconds and repeat Steps 1 through 7 at least 10  times every 1-2 hours when you are awake. Take your time and take a few normal breaths between deep breaths. 9. The spirometer may include an indicator to show your best effort. Use the indicator as a goal to work toward during each repetition. 10. After each set of 10 deep breaths, practice coughing to be sure your lungs are clear. If you have an incision (the cut made at the time of surgery), support your incision when coughing by placing a pillow or rolled up towels firmly against it. Once you are able to get out of bed, walk around indoors and cough well. You may stop using the incentive spirometer when instructed by your caregiver.  RISKS AND COMPLICATIONS  Take your time so you do not get dizzy or light-headed.  If you are in pain, you may need to take or ask for pain medication before doing incentive spirometry. It is harder to take a deep  breath if you are having pain. AFTER USE  Rest and breathe slowly and easily.  It can be helpful to keep track of a log of your progress. Your caregiver can provide you with a simple table to help with this. If you are using the spirometer at home, follow these instructions: [place] IF:   You are having difficultly using the spirometer.  You have trouble using the spirometer as often as instructed.  Your pain medication is not giving enough relief while using the spirometer.  You develop fever of 100.5 F (38.1 C) or higher. SEEK IMMEDIATE MEDICAL CARE IF:   You cough up bloody sputum that had not been present before.  You develop fever of 102 F (38.9 C) or greater.  You develop worsening pain at or near the incision site. MAKE SURE YOU:   Understand these instructions.  Will watch your condition.  Will get help right away if you are not doing well or get worse. Document Released: 06/16/2006 Document Revised: 04/28/2011 Document Reviewed: 08/17/2006 [facility] Patient Information 2014 [place], [place].   ________________________________________________________________________

## 2017-11-19 LAB — SURGICAL PCR SCREEN
MRSA, PCR: POSITIVE — AB
STAPHYLOCOCCUS AUREUS: POSITIVE — AB

## 2017-11-27 ENCOUNTER — Inpatient Hospital Stay (HOSPITAL_COMMUNITY): Payer: Medicare Other

## 2017-11-27 ENCOUNTER — Other Ambulatory Visit: Payer: Self-pay

## 2017-11-27 ENCOUNTER — Encounter (HOSPITAL_COMMUNITY): Payer: Self-pay | Admitting: General Practice

## 2017-11-27 ENCOUNTER — Inpatient Hospital Stay (HOSPITAL_COMMUNITY): Payer: Medicare Other | Admitting: Anesthesiology

## 2017-11-27 ENCOUNTER — Inpatient Hospital Stay (HOSPITAL_COMMUNITY)
Admission: RE | Admit: 2017-11-27 | Discharge: 2017-11-30 | DRG: 467 | Disposition: A | Discharge status: Home Health | Payer: Medicare Other | Attending: Orthopaedic Surgery | Admitting: Orthopaedic Surgery

## 2017-11-27 ENCOUNTER — Encounter (HOSPITAL_COMMUNITY)
Admission: RE | Disposition: A | Discharge status: Home Health | Payer: Self-pay | Source: Home / Self Care | Attending: Orthopaedic Surgery

## 2017-11-27 DIAGNOSIS — K219 Gastro-esophageal reflux disease without esophagitis: Secondary | ICD-10-CM | POA: Diagnosis present

## 2017-11-27 DIAGNOSIS — M419 Scoliosis, unspecified: Secondary | ICD-10-CM | POA: Diagnosis present

## 2017-11-27 DIAGNOSIS — T84018A Broken internal joint prosthesis, other site, initial encounter: Secondary | ICD-10-CM

## 2017-11-27 DIAGNOSIS — Z902 Acquired absence of lung [part of]: Secondary | ICD-10-CM

## 2017-11-27 DIAGNOSIS — I7381 Erythromelalgia: Secondary | ICD-10-CM | POA: Diagnosis present

## 2017-11-27 DIAGNOSIS — D62 Acute posthemorrhagic anemia: Secondary | ICD-10-CM | POA: Diagnosis not present

## 2017-11-27 DIAGNOSIS — T84030A Mechanical loosening of internal right hip prosthetic joint, initial encounter: Secondary | ICD-10-CM | POA: Diagnosis present

## 2017-11-27 DIAGNOSIS — E7800 Pure hypercholesterolemia, unspecified: Secondary | ICD-10-CM | POA: Diagnosis present

## 2017-11-27 DIAGNOSIS — M810 Age-related osteoporosis without current pathological fracture: Secondary | ICD-10-CM | POA: Diagnosis present

## 2017-11-27 DIAGNOSIS — Z96649 Presence of unspecified artificial hip joint: Secondary | ICD-10-CM

## 2017-11-27 DIAGNOSIS — Z8249 Family history of ischemic heart disease and other diseases of the circulatory system: Secondary | ICD-10-CM

## 2017-11-27 DIAGNOSIS — Z833 Family history of diabetes mellitus: Secondary | ICD-10-CM

## 2017-11-27 DIAGNOSIS — E039 Hypothyroidism, unspecified: Secondary | ICD-10-CM | POA: Diagnosis present

## 2017-11-27 DIAGNOSIS — C3411 Malignant neoplasm of upper lobe, right bronchus or lung: Secondary | ICD-10-CM

## 2017-11-27 DIAGNOSIS — M064 Inflammatory polyarthropathy: Secondary | ICD-10-CM | POA: Diagnosis present

## 2017-11-27 DIAGNOSIS — Z800 Family history of malignant neoplasm of digestive organs: Secondary | ICD-10-CM

## 2017-11-27 DIAGNOSIS — M19041 Primary osteoarthritis, right hand: Secondary | ICD-10-CM | POA: Diagnosis present

## 2017-11-27 DIAGNOSIS — Z8719 Personal history of other diseases of the digestive system: Secondary | ICD-10-CM | POA: Diagnosis not present

## 2017-11-27 DIAGNOSIS — L930 Discoid lupus erythematosus: Secondary | ICD-10-CM | POA: Diagnosis present

## 2017-11-27 DIAGNOSIS — Z886 Allergy status to analgesic agent status: Secondary | ICD-10-CM

## 2017-11-27 DIAGNOSIS — K5909 Other constipation: Secondary | ICD-10-CM | POA: Diagnosis present

## 2017-11-27 DIAGNOSIS — Y792 Prosthetic and other implants, materials and accessory orthopedic devices associated with adverse incidents: Secondary | ICD-10-CM | POA: Diagnosis present

## 2017-11-27 DIAGNOSIS — Z888 Allergy status to other drugs, medicaments and biological substances status: Secondary | ICD-10-CM

## 2017-11-27 DIAGNOSIS — I447 Left bundle-branch block, unspecified: Secondary | ICD-10-CM | POA: Diagnosis present

## 2017-11-27 DIAGNOSIS — T84010A Broken internal right hip prosthesis, initial encounter: Secondary | ICD-10-CM | POA: Diagnosis not present

## 2017-11-27 DIAGNOSIS — M2170 Unequal limb length (acquired), unspecified site: Secondary | ICD-10-CM | POA: Diagnosis present

## 2017-11-27 DIAGNOSIS — I7300 Raynaud's syndrome without gangrene: Secondary | ICD-10-CM | POA: Diagnosis present

## 2017-11-27 DIAGNOSIS — Z96641 Presence of right artificial hip joint: Secondary | ICD-10-CM

## 2017-11-27 DIAGNOSIS — M069 Rheumatoid arthritis, unspecified: Secondary | ICD-10-CM | POA: Diagnosis present

## 2017-11-27 DIAGNOSIS — Z419 Encounter for procedure for purposes other than remedying health state, unspecified: Secondary | ICD-10-CM

## 2017-11-27 DIAGNOSIS — M19042 Primary osteoarthritis, left hand: Secondary | ICD-10-CM | POA: Diagnosis present

## 2017-11-27 DIAGNOSIS — M3500 Sicca syndrome, unspecified: Secondary | ICD-10-CM | POA: Diagnosis present

## 2017-11-27 DIAGNOSIS — Z90710 Acquired absence of both cervix and uterus: Secondary | ICD-10-CM | POA: Diagnosis not present

## 2017-11-27 DIAGNOSIS — Z85118 Personal history of other malignant neoplasm of bronchus and lung: Secondary | ICD-10-CM | POA: Diagnosis not present

## 2017-11-27 DIAGNOSIS — D509 Iron deficiency anemia, unspecified: Secondary | ICD-10-CM | POA: Diagnosis present

## 2017-11-27 DIAGNOSIS — I739 Peripheral vascular disease, unspecified: Secondary | ICD-10-CM | POA: Diagnosis present

## 2017-11-27 HISTORY — PX: ANTERIOR HIP REVISION: SHX6527

## 2017-11-27 SURGERY — REVISION, TOTAL ARTHROPLASTY, HIP, ANTERIOR APPROACH
Anesthesia: Spinal | Laterality: Right

## 2017-11-27 MED ORDER — CHLORHEXIDINE GLUCONATE 4 % EX LIQD: 60.0000 mL | Freq: Once | CUTANEOUS | Status: DC

## 2017-11-27 MED ORDER — HYDROCODONE-ACETAMINOPHEN 7.5-325 MG PO TABS
1.0000 | ORAL_TABLET | ORAL | Status: DC | PRN
  Administered 2017-11-27 (×2): 1 via ORAL
  Filled 2017-11-27 (×2): qty 1

## 2017-11-27 MED ORDER — TRANEXAMIC ACID-NACL 1000-0.7 MG/100ML-% IV SOLN
1000.0000 mg | INTRAVENOUS | Status: AC
  Administered 2017-11-27: 1000 mg via INTRAVENOUS
  Filled 2017-11-27: qty 100

## 2017-11-27 MED ORDER — MEPERIDINE HCL 50 MG/ML IJ SOLN: 6.2500 mg | INTRAMUSCULAR | Status: DC | PRN

## 2017-11-27 MED ORDER — PROPOFOL 500 MG/50ML IV EMUL
INTRAVENOUS | Status: DC | PRN
  Administered 2017-11-27: 10 mg via INTRAVENOUS
  Administered 2017-11-27: 15 mg via INTRAVENOUS

## 2017-11-27 MED ORDER — VANCOMYCIN HCL IN DEXTROSE 1-5 GM/200ML-% IV SOLN
1000.0000 mg | INTRAVENOUS | Status: AC
  Administered 2017-11-27: 1000 mg via INTRAVENOUS
  Filled 2017-11-27: qty 200

## 2017-11-27 MED ORDER — METOCLOPRAMIDE HCL 5 MG/ML IJ SOLN: 5.0000 mg | Freq: Three times a day (TID) | INTRAMUSCULAR | Status: DC | PRN

## 2017-11-27 MED ORDER — ACETAMINOPHEN 325 MG PO TABS
325.0000 mg | ORAL_TABLET | Freq: Four times a day (QID) | ORAL | Status: DC | PRN
  Administered 2017-11-28 – 2017-11-29 (×5): 650 mg via ORAL
  Filled 2017-11-27 (×5): qty 2

## 2017-11-27 MED ORDER — SODIUM CHLORIDE 0.9 % IR SOLN
Status: DC | PRN
  Administered 2017-11-27: 2000 mL

## 2017-11-27 MED ORDER — GABAPENTIN 300 MG PO CAPS
300.0000 mg | ORAL_CAPSULE | Freq: Two times a day (BID) | ORAL | Status: DC
  Administered 2017-11-28 – 2017-11-30 (×5): 300 mg via ORAL
  Filled 2017-11-27 (×5): qty 1

## 2017-11-27 MED ORDER — FENTANYL CITRATE (PF) 100 MCG/2ML IJ SOLN: 25.0000 ug | INTRAMUSCULAR | Status: DC | PRN

## 2017-11-27 MED ORDER — ONDANSETRON HCL 4 MG PO TABS: 4.0000 mg | ORAL_TABLET | Freq: Four times a day (QID) | ORAL | Status: DC | PRN

## 2017-11-27 MED ORDER — GABAPENTIN 300 MG PO CAPS: 300.0000 mg | ORAL_CAPSULE | ORAL | Status: DC

## 2017-11-27 MED ORDER — METHOCARBAMOL 500 MG PO TABS
500.0000 mg | ORAL_TABLET | Freq: Four times a day (QID) | ORAL | Status: DC | PRN
  Administered 2017-11-27 – 2017-11-29 (×6): 500 mg via ORAL
  Filled 2017-11-27 (×6): qty 1

## 2017-11-27 MED ORDER — PROPOFOL 10 MG/ML IV BOLUS
INTRAVENOUS | Status: AC
  Filled 2017-11-27: qty 40

## 2017-11-27 MED ORDER — ONDANSETRON HCL 4 MG/2ML IJ SOLN: 4.0000 mg | Freq: Four times a day (QID) | INTRAMUSCULAR | Status: DC | PRN

## 2017-11-27 MED ORDER — LACTATED RINGERS IV SOLN
INTRAVENOUS | Status: DC | PRN
  Administered 2017-11-27: 07:00:00 via INTRAVENOUS

## 2017-11-27 MED ORDER — PANTOPRAZOLE SODIUM 40 MG PO TBEC
40.0000 mg | DELAYED_RELEASE_TABLET | Freq: Every day | ORAL | Status: DC
  Administered 2017-11-28 – 2017-11-30 (×3): 40 mg via ORAL
  Filled 2017-11-27 (×4): qty 1

## 2017-11-27 MED ORDER — SPIRONOLACTONE 12.5 MG HALF TABLET
12.5000 mg | ORAL_TABLET | Freq: Every morning | ORAL | Status: DC
  Administered 2017-11-27 – 2017-11-30 (×4): 12.5 mg via ORAL
  Filled 2017-11-27 (×4): qty 1

## 2017-11-27 MED ORDER — HYDROCODONE-ACETAMINOPHEN 5-325 MG PO TABS
1.0000 | ORAL_TABLET | ORAL | Status: DC | PRN
  Administered 2017-11-28 – 2017-11-30 (×5): 1 via ORAL
  Filled 2017-11-27 (×5): qty 1

## 2017-11-27 MED ORDER — 0.9 % SODIUM CHLORIDE (POUR BTL) OPTIME
TOPICAL | Status: DC | PRN
  Administered 2017-11-27: 1000 mL

## 2017-11-27 MED ORDER — STERILE WATER FOR IRRIGATION IR SOLN
Status: DC | PRN
  Administered 2017-11-27: 3000 mL

## 2017-11-27 MED ORDER — METOCLOPRAMIDE HCL 5 MG/ML IJ SOLN: 10.0000 mg | Freq: Once | INTRAMUSCULAR | Status: DC | PRN

## 2017-11-27 MED ORDER — METOCLOPRAMIDE HCL 5 MG/ML IJ SOLN
INTRAMUSCULAR | Status: DC | PRN
  Administered 2017-11-27 (×2): 5 mg via INTRAVENOUS

## 2017-11-27 MED ORDER — DOCUSATE SODIUM 100 MG PO CAPS
100.0000 mg | ORAL_CAPSULE | Freq: Two times a day (BID) | ORAL | Status: DC
  Administered 2017-11-27 – 2017-11-30 (×6): 100 mg via ORAL
  Filled 2017-11-27 (×7): qty 1

## 2017-11-27 MED ORDER — GABAPENTIN 300 MG PO CAPS
600.0000 mg | ORAL_CAPSULE | Freq: Every day | ORAL | Status: DC
  Administered 2017-11-27 – 2017-11-29 (×3): 600 mg via ORAL
  Filled 2017-11-27 (×4): qty 2

## 2017-11-27 MED ORDER — ADULT MULTIVITAMIN W/MINERALS CH
1.0000 | ORAL_TABLET | Freq: Every day | ORAL | Status: DC
  Administered 2017-11-27 – 2017-11-30 (×4): 1 via ORAL
  Filled 2017-11-27 (×4): qty 1

## 2017-11-27 MED ORDER — VANCOMYCIN HCL IN DEXTROSE 1-5 GM/200ML-% IV SOLN
1000.0000 mg | Freq: Two times a day (BID) | INTRAVENOUS | Status: AC
  Administered 2017-11-27: 1000 mg via INTRAVENOUS
  Filled 2017-11-27: qty 200

## 2017-11-27 MED ORDER — BUPIVACAINE IN DEXTROSE 0.75-8.25 % IT SOLN
INTRATHECAL | Status: DC | PRN
  Administered 2017-11-27: 1.7 mL via INTRATHECAL

## 2017-11-27 MED ORDER — PROPOFOL 500 MG/50ML IV EMUL
INTRAVENOUS | Status: DC | PRN
  Administered 2017-11-27: 25 ug/kg/min via INTRAVENOUS

## 2017-11-27 MED ORDER — LEVOTHYROXINE SODIUM 88 MCG PO TABS
88.0000 ug | ORAL_TABLET | Freq: Every day | ORAL | Status: DC
  Administered 2017-11-27 – 2017-11-29 (×3): 88 ug via ORAL
  Filled 2017-11-27 (×3): qty 1

## 2017-11-27 MED ORDER — CALCIUM CARBONATE-VITAMIN D 500-200 MG-UNIT PO TABS
1.0000 | ORAL_TABLET | Freq: Every day | ORAL | Status: DC
  Administered 2017-11-27 – 2017-11-30 (×4): 1 via ORAL
  Filled 2017-11-27 (×4): qty 1

## 2017-11-27 MED ORDER — METHOCARBAMOL 500 MG IVPB - SIMPLE MED
500.0000 mg | Freq: Four times a day (QID) | INTRAVENOUS | Status: DC | PRN
  Filled 2017-11-27: qty 50

## 2017-11-27 MED ORDER — DIPHENHYDRAMINE HCL 12.5 MG/5ML PO ELIX: 12.5000 mg | ORAL_SOLUTION | ORAL | Status: DC | PRN

## 2017-11-27 MED ORDER — ASPIRIN 81 MG PO CHEW
81.0000 mg | CHEWABLE_TABLET | Freq: Every day | ORAL | Status: DC
  Administered 2017-11-28 – 2017-11-30 (×3): 81 mg via ORAL
  Filled 2017-11-27 (×4): qty 1

## 2017-11-27 MED ORDER — SODIUM CHLORIDE 0.9 % IV SOLN
INTRAVENOUS | Status: DC | PRN
  Administered 2017-11-27: 25 ug/min via INTRAVENOUS

## 2017-11-27 MED ORDER — PHENOL 1.4 % MT LIQD: 1.0000 | OROMUCOSAL | Status: DC | PRN

## 2017-11-27 MED ORDER — CEFAZOLIN SODIUM-DEXTROSE 2-4 GM/100ML-% IV SOLN: 2.0000 g | INTRAVENOUS | Status: DC

## 2017-11-27 MED ORDER — DEXAMETHASONE SODIUM PHOSPHATE 10 MG/ML IJ SOLN
INTRAMUSCULAR | Status: DC | PRN
  Administered 2017-11-27: 10 mg via INTRAVENOUS

## 2017-11-27 MED ORDER — METHOCARBAMOL 500 MG IVPB - SIMPLE MED
INTRAVENOUS | Status: AC
  Administered 2017-11-27: 500 mg
  Filled 2017-11-27: qty 50

## 2017-11-27 MED ORDER — SODIUM CHLORIDE 0.9 % IV SOLN
INTRAVENOUS | Status: DC
  Administered 2017-11-27: 13:00:00 via INTRAVENOUS

## 2017-11-27 MED ORDER — ALUM & MAG HYDROXIDE-SIMETH 200-200-20 MG/5ML PO SUSP: 30.0000 mL | ORAL | Status: DC | PRN

## 2017-11-27 MED ORDER — MENTHOL 3 MG MT LOZG: 1.0000 | LOZENGE | OROMUCOSAL | Status: DC | PRN

## 2017-11-27 MED ORDER — BIOTIN 1000 MCG PO TABS: 1000.0000 ug | ORAL_TABLET | Freq: Every day | ORAL | Status: DC

## 2017-11-27 MED ORDER — POLYETHYLENE GLYCOL 3350 17 G PO PACK: 17.0000 g | PACK | Freq: Every day | ORAL | Status: DC | PRN

## 2017-11-27 MED ORDER — MORPHINE SULFATE (PF) 2 MG/ML IV SOLN: 0.5000 mg | INTRAVENOUS | Status: DC | PRN

## 2017-11-27 MED ORDER — METOCLOPRAMIDE HCL 5 MG PO TABS: 5.0000 mg | ORAL_TABLET | Freq: Three times a day (TID) | ORAL | Status: DC | PRN

## 2017-11-27 MED ORDER — LACTATED RINGERS IV SOLN
Freq: Once | INTRAVENOUS | Status: AC
  Administered 2017-11-27: 07:00:00 via INTRAVENOUS

## 2017-11-27 SURGICAL SUPPLY — 46 items
APL SKNCLS STERI-STRIP NONHPOA (GAUZE/BANDAGES/DRESSINGS) ×1
ARTICULEZE HEAD (Hips) ×2 IMPLANT
BAG SPEC THK2 15X12 ZIP CLS (MISCELLANEOUS)
BAG ZIPLOCK 12X15 (MISCELLANEOUS) IMPLANT
BENZOIN TINCTURE PRP APPL 2/3 (GAUZE/BANDAGES/DRESSINGS) ×2 IMPLANT
BLADE SAW SGTL 18X1.27X75 (BLADE) ×2 IMPLANT
BONE CHIP PRESERV 30CC PCAN30 (Bone Implant) ×2 IMPLANT
CELLS DAT CNTRL 66122 CELL SVR (MISCELLANEOUS) ×1 IMPLANT
CLOTH BEACON ORANGE TIMEOUT ST (SAFETY) ×2 IMPLANT
COVER PERINEAL POST (MISCELLANEOUS) ×2 IMPLANT
COVER SURGICAL LIGHT HANDLE (MISCELLANEOUS) ×2 IMPLANT
COVER WAND RF STERILE (DRAPES) IMPLANT
DRAPE STERI IOBAN 125X83 (DRAPES) ×2 IMPLANT
DRAPE U-SHAPE 47X51 STRL (DRAPES) ×4 IMPLANT
DRSG AQUACEL AG ADV 3.5X10 (GAUZE/BANDAGES/DRESSINGS) ×2 IMPLANT
DURAPREP 26ML APPLICATOR (WOUND CARE) ×2 IMPLANT
ELECT REM PT RETURN 15FT ADLT (MISCELLANEOUS) ×2 IMPLANT
GAUZE XEROFORM 1X8 LF (GAUZE/BANDAGES/DRESSINGS) ×2 IMPLANT
GLOVE BIO SURGEON STRL SZ7.5 (GLOVE) ×4 IMPLANT
GLOVE ECLIPSE 8.0 STRL XLNG CF (GLOVE) ×2 IMPLANT
GLOVE INDICATOR 8.0 STRL GRN (GLOVE) ×4 IMPLANT
GOWN STRL REUS W/TWL LRG LVL3 (GOWN DISPOSABLE) ×4 IMPLANT
GRAFT BNE CANC CHIPS 1-8 30CC (Bone Implant) IMPLANT
HANDPIECE INTERPULSE COAX TIP (DISPOSABLE) ×2
HEAD ARTICULEZE (Hips) IMPLANT
HOLDER FOLEY CATH W/STRAP (MISCELLANEOUS) ×2 IMPLANT
LINER ACETAB NEUTRAL 36ID 520D (Liner) ×1 IMPLANT
PACK ANTERIOR HIP CUSTOM (KITS) ×2 IMPLANT
PIN SECTOR W/GRIP ACE CUP 52MM (Hips) ×1 IMPLANT
RETRACTOR WND ALEXIS 18 MED (MISCELLANEOUS) ×1 IMPLANT
RTRCTR WOUND ALEXIS 18CM MED (MISCELLANEOUS) ×2
SCREW 6.5MMX25MM (Screw) ×3 IMPLANT
SCREW 6.5MMX30MM (Screw) ×1 IMPLANT
SET HNDPC FAN SPRY TIP SCT (DISPOSABLE) ×1 IMPLANT
STAPLER VISISTAT 35W (STAPLE) ×1 IMPLANT
STRIP CLOSURE SKIN 1/2X4 (GAUZE/BANDAGES/DRESSINGS) ×2 IMPLANT
SUT ETHIBOND NAB CT1 #1 30IN (SUTURE) ×1 IMPLANT
SUT MNCRL AB 4-0 PS2 18 (SUTURE) ×2 IMPLANT
SUT VIC AB 0 CT1 36 (SUTURE) ×2 IMPLANT
SUT VIC AB 1 CT1 36 (SUTURE) ×6 IMPLANT
SUT VIC AB 2-0 CT1 27 (SUTURE) ×4
SUT VIC AB 2-0 CT1 TAPERPNT 27 (SUTURE) ×2 IMPLANT
SWAB CULTURE ESWAB REG 1ML (MISCELLANEOUS) ×1 IMPLANT
TRAY FOLEY MTR SLVR 16FR STAT (SET/KITS/TRAYS/PACK) ×2 IMPLANT
WATER STERILE IRR 1000ML POUR (IV SOLUTION) ×2 IMPLANT
YANKAUER SUCT BULB TIP NO VENT (SUCTIONS) ×2 IMPLANT

## 2017-11-27 NOTE — [facility] Group Notes (Deleted)
  [place] LCSW Group Therapy Note  Date/Time: 11/27/17, 1300  Type of Therapy/Topic:  Group Therapy:  Emotion Regulation  Participation Level:  Did Not Attend   Mood:  Description of Group:    The purpose of this group is to assist patients in learning to regulate negative emotions and experience positive emotions. Patients will be guided to discuss ways in which they have been vulnerable to their negative emotions. These vulnerabilities will be juxtaposed with experiences of positive emotions or situations, and patients challenged to use positive emotions to combat negative ones. Special emphasis will be placed on coping with negative emotions in conflict situations, and patients will process healthy conflict resolution skills.  Therapeutic Goals: 1. Patient will identify two positive emotions or experiences to reflect on in order to balance out negative emotions:  2. Patient will label two or more emotions that they find the most difficult to experience:  3. Patient will be able to demonstrate positive conflict resolution skills through discussion or role plays:   Summary of Patient Progress:       Therapeutic Modalities:   Cognitive Behavioral Therapy Feelings Identification Dialectical Behavioral Therapy  Lurline Idol, LCSW

## 2017-11-27 NOTE — Op Note (Signed)
NAME: [name], [name] MEDICAL RECORD OF:[id number] ACCOUNT [id number] DATE OF BIRTH:May 21, 1930 FACILITY: [facility] LOCATION: [facility] PHYSICIAN:Phillipe Clemon [place], MD  OPERATIVE REPORT  DATE OF PROCEDURE:  11/27/2017  PREOPERATIVE DIAGNOSIS:  Failed right hip acetabular component, status post total hip arthroplasty.  POSTOPERATIVE DIAGNOSIS:  Failed right hip acetabular component, status post total hip arthroplasty.  PROCEDURE:  Right hip acetabular revision (revision of 1 component only).  FINDINGS:  Aseptic loosening of right hip acetabular component with no evidence of infection.  An intraoperative Gram stain negative for organisms.  IMPLANTS:  DePuy Sector Gription acetabular component size 52 with 3 screws, size 36+0 neutral polyethylene liner, size 36+5 metal hip ball.  SURGEON:  Lind Guest. Ninfa Linden, MD  ASSISTANT:  Erskine Emery, PA-C.  ANESTHESIA:  Spinal.  ANTIBIOTICS:  One gram IV vancomycin.  ESTIMATED BLOOD LOSS:  277 mL  COMPLICATIONS:  None.  INDICATIONS:  The patient is an 84 year old female who underwent total hip arthroplasty through direct anterior approach of her right hip several years ago.  She had done well until this year when she noted increasing hip pain.  She does have a leg  length discrepancy with severe scoliosis of her lumbar spine.  We obtained x-rays in the office and compared this to our previous films and I do feel that the acetabular component has failed.  It did have a shifting in position with a more vertical  aspect of things.  With that being said, we recommend she undergo revision of this arthroplasty.  It does not appear to be infected given her history as well as her normal vital signs and normal preoperative labs with no increase in inflammatory markers.   I told her in detail about the rationale behind this surgery and she does wish to proceed.  She understands the risk of acute blood loss anemia, nerve or vessel injury,  fracture, infection, dislocation, DVT and implant failure.  She understands her  goals are to decrease pain, improve mobility and overall improve quality of life.  DESCRIPTION OF PROCEDURE:  After informed consent was obtained and appropriate right hip was marked.  She was brought to the operating room and sat up on a stretcher.  Spinal anesthesia was obtained, she was laid in the supine position.  A Foley catheter  was placed and both feet had traction boots applied to them.  Next, she was placed supine on the Hana fracture table with a perineal post in place and both legs in line skeletal traction device and no traction applied.  Her right operative hip was  prepped and draped with DuraPrep and sterile drapes.  A time-out was called to identify correct patient, correct right hip.  We then went through her previous incision, dissected down through the tensor fascia to the hip joint itself.  We opened the hip  joint and found just a very mild effusion.  We sent off for stat Gram stain and cultures.  We then were able to dislocate the hip and remove the previous ceramic femoral head.  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  We went down to the acetabular  component.  Once we removed some scar tissue, we were able to remove the polyethylene liner easily.  We then removed the single screw that was loose and removed the acetabular component without any difficulty at all.  We did culture behind the acetabular  component as well.  We did find her bone to be quite frail and there was no bone ingrowth at all  in the acetabular component.  Meticulously under direct visualization and fluoroscopy, I reamed up for a size 52 acetabular component.  She had a 48 in  place.  We did place a supplemental cancellous bone graft into pelvis.  We then placed the real DePuy Sector Gription acetabular component size 52 and 3 screws and we felt like we had a solid fit at that point.  We then placed the real 36+0 polyethylene   liner.  We then went with a 36+5 metal hip ball, reduced this in the acetabulum and we felt like her leg lengths were improved as well as range of motion and stability.  This was all done again under direct visualization and fluoroscopy.  We then  irrigated the soft tissue with normal saline solution using pulsatile lavage.  We tried to close the remnants of the joint capsule with interrupted #1 Ethibond suture, followed by 0 Vicryl in the deeper tissue, 2-0 Vicryl subcutaneous tissue and  interrupted staples on the skin.  Xeroform and Aquacel dressing was applied.  She was taken off the Hana table and taken to recovery room in stable condition.  All final counts were correct.  There were no complications noted.  Note Raven Stabile, PA-C,  assisted in the entire case.  Assistance was crucial for facilitating all aspects of this case.  TN/NUANCE  D:11/27/2017 T:11/27/2017 JOB:[id number]

## 2017-11-27 NOTE — Anesthesia Procedure Notes (Signed)
Performed by: Glory Buff, CRNA

## 2017-11-27 NOTE — Progress Notes (Signed)
PHARMACIST - PHYSICIAN ORDER COMMUNICATION  CONCERNING: P&T Medication Policy on Herbal Medications  DESCRIPTION:  This patient's order for:  biotin  has been noted.  This product(s) is classified as an "herbal" or natural product. Due to a lack of definitive safety studies or FDA approval, nonstandard manufacturing practices, plus the potential risk of unknown drug-drug interactions while on inpatient medications, the Pharmacy and Therapeutics Committee does not permit the use of "herbal" or natural products of this type within [facility].   ACTION TAKEN: The pharmacy department is unable to verify this order at this time. Please reevaluate patient's clinical condition at discharge and address if the herbal or natural product(s) should be resumed at that time.  Ulice Dash, PharmD Clinical Pharmacist

## 2017-11-27 NOTE — Transfer of Care (Signed)
Immediate Anesthesia Transfer of Care Note  Patient: [name]  Procedure(s) Performed: RIGHT HIP ACETABULAR REVISION (Right )  Patient Location: PACU  Anesthesia Type:Spinal  Level of Consciousness: awake, alert  and oriented  Airway & Oxygen Therapy: Patient Spontanous Breathing and Patient connected to face mask oxygen  Post-op Assessment: Report given to RN and Post -op Vital signs reviewed and stable  Post vital signs: Reviewed and stable  Last Vitals:  Vitals Value Taken Time  BP 138/68 11/27/2017 11:07 AM  Temp    Pulse 63 11/27/2017 11:11 AM  Resp 15 11/27/2017 11:11 AM  SpO2 100 % 11/27/2017 11:11 AM  Vitals shown include unvalidated device data.  Last Pain:  Vitals:   11/27/17 0657  TempSrc:   PainSc: 5          Complications: No apparent anesthesia complications

## 2017-11-27 NOTE — Anesthesia Postprocedure Evaluation (Signed)
Anesthesia Post Note  Patient: [name]  Procedure(s) Performed: RIGHT HIP ACETABULAR REVISION (Right )     Patient location during evaluation: PACU Anesthesia Type: Spinal Level of consciousness: awake and alert Pain management: pain level controlled Vital Signs Assessment: post-procedure vital signs reviewed and stable Respiratory status: spontaneous breathing and respiratory function stable Cardiovascular status: blood pressure returned to baseline and stable Postop Assessment: no headache, no backache, spinal receding and no apparent nausea or vomiting Anesthetic complications: no    Last Vitals:  Vitals:   11/27/17 1204 11/27/17 1403  BP: 125/64 124/68  Pulse: 76 83  Resp: 15   Temp: (!) 36.4 C 36.6 C  SpO2: 100% 100%    Last Pain:  Vitals:   11/27/17 1403  TempSrc: Oral  PainSc:                  [name]

## 2017-11-27 NOTE — Anesthesia Procedure Notes (Signed)
Date/Time: 11/27/2017 8:43 AM Performed by: Glory Buff, CRNA Oxygen Delivery Method: Simple face mask

## 2017-11-27 NOTE — Care Management Note (Signed)
Case Management Note  Patient Details  Name: [name] MRN: [id number] Date of Birth: 01-23-1931  Subjective/Objective:    Plan for d/c to SNF, discharge planning per CSW. [phone]                Action/Plan:   Expected Discharge Date:                  Expected Discharge Plan:  [place]  In-House Referral:  Clinical Social Work  Discharge planning Services  CM Consult  Post Acute Care Choice:  NA Choice offered to:  Patient  DME Arranged:  N/A DME Agency:  NA  HH Arranged:  NA HH Agency:  NA  Status of Service:  Completed, signed off  If discussed at [place] of Stay Meetings, dates discussed:    Additional Comments:  Guadalupe Maple, RN 11/27/2017, 2:36 PM

## 2017-11-27 NOTE — Brief Op Note (Signed)
11/27/2017  10:55 AM  PATIENT:  [name]  84 y.o. female  PRE-OPERATIVE DIAGNOSIS:  right total hip acetabular component failure  POST-OPERATIVE DIAGNOSIS:  right total hip acetabular component failure  PROCEDURE:  Procedure(s): RIGHT HIP ACETABULAR REVISION (Right)  SURGEON:  Surgeon(s) and Role:    [name], MD - Primary  PHYSICIAN ASSISTANT: [name], PA-C  ANESTHESIA:   spinal  EBL:  250 mL   COUNTS:  YES  DICTATION: .Other Dictation: Dictation Number [id number]  PLAN OF CARE: Admit to inpatient   PATIENT DISPOSITION:  PACU - hemodynamically stable.   Delay start of Pharmacological VTE agent (>24hrs) due to surgical blood loss or risk of bleeding: no

## 2017-11-27 NOTE — Anesthesia Preprocedure Evaluation (Addendum)
Anesthesia Evaluation  Patient identified by MRN, date of birth, ID band Patient awake    Reviewed: Allergy & Precautions, H&P , NPO status , Patient's Chart, lab work & pertinent test results  History of Anesthesia Complications (+) PONV and history of anesthetic complications  Airway Mallampati: I  TM Distance: >3 FB Neck ROM: Full    Dental  (+) Edentulous Upper, Edentulous Lower, Dental Advisory Given   Pulmonary neg pulmonary ROS,    Pulmonary exam normal        Cardiovascular + Peripheral Vascular Disease  Normal cardiovascular exam+ dysrhythmias (LBBB)   She subsequently had a nuclear stress test (signed on 12/25/08) that showed "EF 58% with fixed septal defect likely due to LBBB. No ischemia." PRN cardiology follow-up was recommended.    Echo on 12/11/08 showed: Normal LV size with mild global systolic dysfunction, EF 95-32%. Normal RV size and systolic function. Mild aortic insufficiency. Mild mitral insufficiency.    Neuro/Psych negative neurological ROS  negative psych ROS   GI/Hepatic Neg liver ROS, GERD  Medicated,  Endo/Other  negative endocrine ROS  Renal/GU negative Renal ROS  negative genitourinary   Musculoskeletal  (+) Arthritis , Rheumatoid disorders,    Abdominal   Peds negative pediatric ROS (+)  Hematology negative hematology ROS (+) anemia ,   Anesthesia Other Findings   Reproductive/Obstetrics negative OB ROS                             Anesthesia Physical  Anesthesia Plan  ASA: III  Anesthesia Plan: Spinal   Post-op Pain Management:    Induction:   PONV Risk Score and Plan: 3 and Dexamethasone and Metaclopromide  Airway Management Planned: Simple Face Mask  Additional Equipment:   Intra-op Plan:   Post-operative Plan:   Informed Consent: I have reviewed the patients History and Physical, chart, labs and discussed the procedure including the  risks, benefits and alternatives for the proposed anesthesia with the patient or authorized representative who has indicated his/her understanding and acceptance.   Dental advisory given  Plan Discussed with: CRNA  Anesthesia Plan Comments:         Anesthesia Quick Evaluation

## 2017-11-27 NOTE — H&P (Signed)
TOTAL HIP REVISION ADMISSION H&P  Patient is admitted for right revision total hip arthroplasty.  Subjective:  Chief Complaint: right hip pain  HPI: [name], 84 y.o. 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 The indications for the revision total hip arthroplasty are loosening of one or more components.  Onset of symptoms was gradual starting 1 years ago with gradually worsening course since that time.  Prior procedures on the right hip include arthroplasty.  Patient currently rates pain in the right hip at 3 out of 10 with activity.  There is worsening of pain with activity and weight bearing and trendelenberg gait. Patient has evidence of prosthetic loosening by imaging studies.  This condition presents safety issues increasing the risk of falls.  There is no current active infection.  Patient Active Problem List   Diagnosis Date Noted  . Failed total hip arthroplasty ([place]) 11/27/2017  . Elevated cholesterol 10/11/2015  . 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[place]) 10/04/2014  . Bilateral leg edema 08/01/2014  . Elevated BP 08/01/2014  . Dehydration 08/01/2014  . Rheumatoid arthritis involving multiple joints ([place]) 05/30/2014  . Osteoarthritis of right hip 04/28/2014  . Status post total replacement of right hip 04/28/2014  . Long-term use of high-risk medication 11/11/2013  . Routine general medical examination at a health care facility 11/11/2013  . Anemia, unspecified 11/11/2013  . Neuropathic pain 07/07/2013  . Constipation due to pain medication 07/07/2013  . Protein-calorie malnutrition, severe ([place]) 06/08/2013  . Lung cancer, Right upper lobe 05/08/2013  . Anterior chest wall pain  09/18/2011  . Breast lump 09/18/2011  . Sciatica of right side 08/13/2011  . Sinusitis 06/13/2011  . Cerumen impaction 08/20/2010  . SINUSITIS - ACUTE-NOS 04/30/2010  . Osteopenia 03/07/2010  . PARESTHESIA 03/07/2010  . CONTUSION OF LOWER LEG 11/29/2009  . DIZZINESS 10/01/2009  . Backache 03/16/2009  . CT, CHEST, ABNORMAL 12/18/2008  . ABNORMAL ECHOCARDIOGRAM 12/14/2008  . URI 11/29/2008  . [place] SYNDROME 11/29/2008  . CONTACT DERMATITIS 11/02/2006  . HYPOGLYCEMIA 06/29/2006  . RAYNAUD'S DISEASE 06/29/2006   Past Medical History:  Diagnosis Date  . Arthralgia of multiple joints    followed by dr Gerilyn Nestle  . Arthritis   . Chronic constipation   . Chronic inflammatory arthritis    rhemotolgist-  dr a. Gerilyn Nestle [facility])  . Dry eyes    eye drops used   . GERD (gastroesophageal reflux disease)   . H/O discoid lupus erythematosus   . Hiatal hernia   . History of colon polyps   . Hypothyroidism   . Iron deficiency anemia   . LBBB (left bundle branch block) 2010  . Mitchell's disease (erythromelalgia) [facility])    neurologist-  dr patel  . Nocturia   . Non-small cell cancer of right lung [facility]) surgeon-- dr gerhardt/  oncologist-  dr Julien Nordmann--- per lov notes no recurrence/   11-18-2017 per pt denies any symptoms   dx 2015--  Stage IIA (T2b,N0,M0) , +EGFR  mutation in exon 21, non-small cell adenocarcinoma right upper lobe---  s/p  Right upper lobectomy , right middley wedge resection and node dissection---  no chemo or radiation therapy  . OA (osteoarthritis)    hands  . Osteoporosis   . PONV (postoperative  nausea and vomiting)    likes phenergan  . Raynaud's phenomenon 1965  . Renal insufficiency   . Rheumatoid arthritis ([place])   . Sciatica   . Scoliosis   . Sjogren's syndrome ([place])   . Urgency of urination     Past Surgical History:  Procedure Laterality Date  . APPENDECTOMY  1950s  . CARDIOVASCULAR STRESS TEST  12/2008    mild fixed basal to mid septal  perfusion defect felt likely due to artifact from LBBB, no ischemia, EF 58%  . COLONOSCOPY    . LYMPH NODE DISSECTION Right 06/07/2013   Procedure: LYMPH NODE DISSECTION;  Surgeon: [name], MD;  Location: [place];  Service: Thoracic;  Laterality: Right;  . [place]   "large incision from chest to up to shoulder, the nerves were tied together, for raynaud's  . THORACOTOMY  06/07/2013   Procedure: MINI/LIMITED THORACOTOMY; right middle lobe wedge resection;  Surgeon: [name], MD;  Location: [place];  Service: Thoracic;;  . TONSILLECTOMY  child  . TOTAL ABDOMINAL HYSTERECTOMY  1980's    W/ BSO  . TOTAL HIP ARTHROPLASTY Right 04/28/2014   Procedure: RIGHT TOTAL HIP ARTHROPLASTY ANTERIOR APPROACH;  Surgeon: [name], MD;  Location: [facility];  Service: Orthopedics;  Laterality: Right;  . TRANSTHORACIC ECHOCARDIOGRAM  12/11/2008   ef 15-05%, grade 1 diastolic dysfunction/  mild LAE/  mild AR and MR/  trivial TR  . VIDEO ASSISTED THORACOSCOPY (VATS)/WEDGE RESECTION Right 06/07/2013   Procedure: VIDEO ASSISTED THORACOSCOPY (VATS)/right upper lobectomy, On Q;  Surgeon: [name], MD;  Location: [place];  Service: Thoracic;  Laterality: Right;  Mar[place] Kitchen VIDEO BRONCHOSCOPY N/A 06/07/2013   Procedure: VIDEO BRONCHOSCOPY;  Surgeon: [name], MD;  Location: [facility] OR;  Service: Thoracic;  Laterality: N/A;  . VIDEO BRONCHOSCOPY WITH ENDOBRONCHIAL NAVIGATION N/A 05/04/2013   Procedure: VIDEO BRONCHOSCOPY WITH ENDOBRONCHIAL NAVIGATION;  Surgeon: [name], MD;  Location: [place];  Service: Thoracic;  Laterality: N/A;    Current Facility-Administered Medications  Medication Dose Route Frequency Provider Last Rate Last Dose  . ceFAZolin (ANCEF) IVPB 2g/100 mL premix  2 g Intravenous On Call to OR Pete Pelt, PA-C      . chlorhexidine (HIBICLENS) 4 % liquid 4 application  60 mL Topical Once Erskine Emery W, PA-C      . tranexamic acid (CYKLOKAPRON)  IVPB 1,000 mg  1,000 mg Intravenous To OR Pete Pelt, PA-C      . vancomycin (VANCOCIN) IVPB 1000 mg/200 mL premix  1,000 mg Intravenous On Call to OR Polly Cobia, RPH       Allergies  Allergen Reactions  . Amlodipine Rash  . Prochlorperazine Edisylate Anaphylaxis    Compazine--- tongue swells and rash  . Aspirin Other (See Comments)    nose bleeds  . Cymbalta [Duloxetine Hcl] Diarrhea, Nausea And Vomiting and Other (See Comments)    Increased blood pressure  . Pamelor [Nortriptyline Hcl] Diarrhea and Nausea Only    Increased Heart rate and BP    Social History   Tobacco Use  . Smoking status: Never Smoker  . Smokeless tobacco: Never Used  Substance Use Topics  . Alcohol use: Not Currently    Frequency: Never    Family History  Problem Relation Age of Onset  . Coronary artery disease Father   . Colon cancer Father   . Diabetes Father   . Cancer Father  colon  . Other Mother 19       MVA  . Healthy Sister   . Other Brother        Killed on war  . Healthy Daughter   . Esophageal cancer Neg Hx   . Kidney disease Neg Hx   . Liver disease Neg Hx       Review of Systems  Musculoskeletal: Positive for joint pain.  All other systems reviewed and are negative.   Objective:  Physical Exam  Constitutional: She is oriented to person, place, and time. She appears well-developed and well-nourished.  HENT:  Head: Normocephalic and atraumatic.  Eyes: Pupils are equal, round, and reactive to light.  Neck: Normal range of motion.  Cardiovascular: Normal rate.  Respiratory: Effort normal.  GI: Soft.  Musculoskeletal:       Right hip: She exhibits decreased strength and tenderness.  Neurological: She is alert and oriented to person, place, and time.  Skin: Skin is warm and dry.  Psychiatric: She has a normal mood and affect.   Her right leg is shorter than the left due to the change in position of the failed acetabular component.  Vital signs in last 24  hours: Temp:  [97.9 F (36.6 C)] 97.9 F (36.6 C) (10/11 0650) Pulse Rate:  [74] 74 (10/11 0650) Resp:  [16] 16 (10/11 0650) BP: (148)/(75) 148/75 (10/11 0650) SpO2:  [100 %] 100 % (10/11 0650) Weight:  [48.2 kg] 48.2 kg (10/11 0657)   Labs:   Estimated body mass index is 18.81 kg/m as calculated from the following:   Height as of this encounter: _0  (1.6 m).   Weight as of this encounter: 48.2 kg.  Imaging Review:  Plain radiographs demonstrate failure of the right total hip acetabular component  Preoperative templating of the joint replacement has been completed, documented, and submitted to the Operating Room personnel in order to optimize intra-operative equipment management.   Assessment/Plan:  Failed right total hip acetabular component  The patient history, physical examination, clinical judgement of the provider and imaging studies are consistent a failure of the acetabular component of the right hip(s), previous total hip arthroplasty. Revision total hip arthroplasty is deemed medically necessary. The treatment options including medical management, injection therapy, arthroscopy and arthroplasty were discussed at length. The risks and benefits of total hip arthroplasty were presented and reviewed. The risks due to aseptic loosening, infection, stiffness, dislocation/subluxation,  thromboembolic complications and other imponderables were discussed.  The patient acknowledged the explanation, agreed to proceed with the plan and consent was signed. Patient is being admitted for inpatient treatment for surgery, pain control, PT, OT, prophylactic antibiotics, VTE prophylaxis, progressive ambulation and ADL's and discharge planning. The patient is planning to be discharged to skilled nursing facility

## 2017-11-27 NOTE — Plan of Care (Signed)
  Problem: Clinical Measurements: Goal: Ability to maintain clinical measurements within normal limits will improve Outcome: Progressing Goal: Will remain free from infection Outcome: Progressing Goal: Diagnostic test results will improve Outcome: Progressing Goal: Respiratory complications will improve Outcome: Progressing Goal: Cardiovascular complication will be avoided Outcome: Progressing   Problem: Elimination: Goal: Will not experience complications related to bowel motility Outcome: Progressing   Problem: Pain Managment: Goal: General experience of comfort will improve Outcome: Progressing   Problem: Safety: Goal: Ability to remain free from injury will improve Outcome: Progressing   Problem: Education: Goal: Knowledge of the prescribed therapeutic regimen will improve Outcome: Progressing   Carlynn Herald, RN 11/27/17 11:50 PM

## 2017-11-27 NOTE — Evaluation (Signed)
Physical Therapy Evaluation Patient Details Name: [name] MRN: [id number] DOB: 10-Dec-1930 Today's Date: 11/27/2017   History of Present Illness  84 YO female s/p R DA-THA revision acetabular component 11/27/17. PMH includes R DA-THA 2016, RA, OA, lung cancer, sciactica, osteopenia, sjogren's syndrome, LBBB, raynaud's, thoracotomy, thoracic sympathectomy.   Clinical Impression   Pt with R hip pain, difficulty performing mobility tasks, and decreased tolerance for ambulation at this time. Pt presented with some nausea upon sitting EOB today, but this passed quickly. Pt ambulated 20 ft with RW with min guard assist today. PT to continue to follow and progress mobility as able. Pt states that she wants to go home, PT recommendation SNF vs home with HHPT if family is able to assist pt as needed.     Follow Up Recommendations Follow surgeon's recommendation for DC plan and follow-up therapies;Supervision for mobility/OOB(SNF )    Equipment Recommendations  None recommended by PT    Recommendations for Other Services       Precautions / Restrictions Precautions Precautions: Fall Restrictions Weight Bearing Restrictions: No Other Position/Activity Restrictions: WBAT       Mobility  Bed Mobility Overal bed mobility: Needs Assistance Bed Mobility: Supine to Sit     Supine to sit: Min assist;HOB elevated     General bed mobility comments: Min assist for scooting to EOB, LE management, trunk elevation. Increased time/effort. Pt with nausea upon sitting EOB, passed after a couple of minutes of sitting up.   Transfers Overall transfer level: Needs assistance Equipment used: Rolling walker (2 wheeled) Transfers: Sit to/from Stand Sit to Stand: Min assist;+2 safety/equipment;From elevated surface         General transfer comment: Min assist for power up and steadying upon standing. Verbal cuing for hand placement.   Ambulation/Gait Ambulation/Gait assistance: Min  guard Gait Distance (Feet): 20 Feet Assistive device: Rolling walker (2 wheeled) Gait Pattern/deviations: Step-to pattern;Decreased stride length;Decreased stance time - right;Decreased weight shift to right Gait velocity: decr    General Gait Details: Min guard for safety. Verbal cuing to step into RW, turning, sequencing.   Stairs            Wheelchair Mobility    Modified Rankin (Stroke Patients Only)       Balance Overall balance assessment: Mild deficits observed, not formally tested                                           Pertinent Vitals/Pain Pain Assessment: 0-10 Pain Score: 4  Pain Location: R hip  Pain Descriptors / Indicators: Sore Pain Intervention(s): Limited activity within patient's tolerance;Repositioned;Ice applied;Monitored during session    [place] expects to be discharged to:: Private residence Living Arrangements: Alone Available Help at Discharge: Family;Available PRN/intermittently Type of Home: House Home Access: Stairs to enter Entrance Stairs-Rails: Right;Left Entrance Stairs-Number of Steps: 3 (back steps); 2 steps in front with no rails  Home Layout: Two level;Able to live on main level with bedroom/bathroom Home Equipment: Shower seat - built in;Grab bars - tub/shower;Bedside commode;Walker - 2 wheels;Walker - 4 wheels;Cane - single point      Prior Function Level of Independence: Independent with assistive device(s)         Comments: used 4WW prior to admission for ambulation as needed      Hand Dominance   Dominant Hand: Right    Extremity/Trunk Assessment  Upper Extremity Assessment Upper Extremity Assessment: Overall WFL for tasks assessed    Lower Extremity Assessment Lower Extremity Assessment: Generalized weakness;RLE deficits/detail RLE Deficits / Details: suspected post-surgical weakness; able to perform quad set, ankle pumps, SLR with assist  RLE Sensation: WNL     Cervical / Trunk Assessment Cervical / Trunk Assessment: Kyphotic  Communication   Communication: No difficulties  Cognition Arousal/Alertness: Awake/alert Behavior During Therapy: WFL for tasks assessed/performed Overall Cognitive Status: Within Functional Limits for tasks assessed                                        General Comments      Exercises Total Joint Exercises Ankle Circles/Pumps: AROM;Both;5 reps;Seated   Assessment/Plan    PT Assessment Patient needs continued PT services  PT Problem List Decreased strength;Pain;Decreased range of motion;Decreased activity tolerance;Decreased knowledge of use of DME;Decreased balance;Decreased mobility       PT Treatment Interventions DME instruction;Therapeutic activities;Gait training;Therapeutic exercise;Patient/family education;Balance training;Functional mobility training    PT Goals (Current goals can be found in the Care Plan section)  Acute Rehab PT Goals PT Goal Formulation: With patient Time For Goal Achievement: 12/11/17 Potential to Achieve Goals: Good    Frequency 7X/week   Barriers to discharge        Co-evaluation               AM-PAC PT "6 Clicks" Daily Activity  Outcome Measure Difficulty turning over in bed (including adjusting bedclothes, sheets and blankets)?: Unable Difficulty moving from lying on back to sitting on the side of the bed? : Unable Difficulty sitting down on and standing up from a chair with arms (e.g., wheelchair, bedside commode, etc,.)?: Unable Help needed moving to and from a bed to chair (including a wheelchair)?: A Little Help needed walking in hospital room?: A Little Help needed climbing 3-5 steps with a railing? : A Lot 6 Click Score: 11    End of Session Equipment Utilized During Treatment: Gait belt Activity Tolerance: Patient tolerated treatment well Patient left: in chair;with chair alarm set;with call bell/phone within reach;with  family/visitor present;with SCD's reapplied Nurse Communication: Mobility status PT Visit Diagnosis: Other abnormalities of gait and mobility (R26.89);Difficulty in walking, not elsewhere classified (R26.2)    Time: 3818-2993 PT Time Calculation (min) (ACUTE ONLY): 32 min   Charges:   PT Evaluation $PT Eval Low Complexity: 1 Low PT Treatments $Gait Training: 8-22 mins       Julien Girt, PT Acute Rehabilitation Services Pager [phone]  Office [phone]   Rashawnda Gaba D Elonda Husky 11/27/2017, 7:27 PM

## 2017-11-27 NOTE — Anesthesia Procedure Notes (Signed)
Spinal  Patient location during procedure: OR Start time: 11/27/2017 8:48 AM End time: 11/27/2017 8:55 AM Staffing Anesthesiologist: [name], MD Resident/CRNA: Glory Buff, CRNA Performed: resident/CRNA  Preanesthetic Checklist Completed: patient identified, site marked, surgical consent, pre-op evaluation, timeout performed, IV checked, risks and benefits discussed and monitors and equipment checked Spinal Block Patient position: sitting Prep: DuraPrep Patient monitoring: heart rate, continuous pulse ox and blood pressure Approach: left paramedian Location: L2-3 Injection technique: single-shot Needle Needle type: Quincke  Needle gauge: 22 G Needle length: 9 cm Assessment Sensory level: T6 Additional Notes Kit expiration date checked and verified.  Sterile prep and drape, skin local with 1% xylocaine, left paramedian, - heme, - paraesthesia, + CSF pre and post injection, patient tolerated well.

## 2017-11-28 ENCOUNTER — Encounter (HOSPITAL_COMMUNITY): Payer: Self-pay | Admitting: Orthopaedic Surgery

## 2017-11-28 LAB — CBC
HCT: 33.6 % — ABNORMAL LOW (ref 36.0–46.0)
HEMATOCRIT: 22.9 % — AB (ref 36.0–46.0)
HEMOGLOBIN: 6.9 g/dL — AB (ref 12.0–15.0)
Hemoglobin: 10.4 g/dL — ABNORMAL LOW (ref 12.0–15.0)
MCH: 25.9 pg — ABNORMAL LOW (ref 26.0–34.0)
MCH: 24.4 pg — AB (ref 26.0–34.0)
MCHC: 30.1 g/dL (ref 30.0–36.0)
MCHC: 31 g/dL (ref 30.0–36.0)
MCV: 86.1 fL (ref 80.0–100.0)
MCV: 78.9 fL — AB (ref 80.0–100.0)
NRBC: 0 % (ref 0.0–0.2)
PLATELETS: 159 10*3/uL (ref 150–400)
Platelets: 155 10*3/uL (ref 150–400)
RBC: 2.66 MIL/uL — ABNORMAL LOW (ref 3.87–5.11)
RBC: 4.26 MIL/uL (ref 3.87–5.11)
RDW: 14.8 % (ref 11.5–15.5)
RDW: 22.1 % — AB (ref 11.5–15.5)
WBC: 5.4 10*3/uL (ref 4.0–10.5)
WBC: 6 10*3/uL (ref 4.0–10.5)
nRBC: 0 % (ref 0.0–0.2)

## 2017-11-28 LAB — BASIC METABOLIC PANEL
ANION GAP: 7 (ref 5–15)
BUN: 20 mg/dL (ref 8–23)
CHLORIDE: 100 mmol/L (ref 98–111)
CO2: 23 mmol/L (ref 22–32)
Calcium: 8.2 mg/dL — ABNORMAL LOW (ref 8.9–10.3)
Creatinine, Ser: 0.8 mg/dL (ref 0.44–1.00)
GFR calc Af Amer: >60 mL/min (ref >60)
GFR calc non Af Amer: >60 mL/min (ref >60)
GLUCOSE: 89 mg/dL (ref 70–99)
Potassium: 4.2 mmol/L (ref 3.5–5.1)
Sodium: 130 mmol/L — ABNORMAL LOW (ref 135–145)

## 2017-11-28 LAB — PREPARE RBC (CROSSMATCH)

## 2017-11-28 MED ORDER — SODIUM CHLORIDE 0.9% IV SOLUTION: Freq: Once | INTRAVENOUS | Status: AC

## 2017-11-28 NOTE — Plan of Care (Signed)
Pt stable at this time. No changes to note. No needs at this time. Medicating pt for pain as needed. Family at bedside.

## 2017-11-28 NOTE — Progress Notes (Signed)
PT Cancellation Note  Patient Details Name: [name] MRN: [id number] DOB: [birth year]   Cancelled Treatment:    Reason Eval/Treat Not Completed: Medical issues which prohibited therapy--low hgb. Pt awaiting transfusion. Will hold PT for now. Will check back as schedule allows.    Weston Anna, PT Acute Rehabilitation Services Pager: [phone] Office: [phone]

## 2017-11-28 NOTE — Progress Notes (Signed)
OT Cancellation Note  Patient Details Name: [name] MRN: [id number] DOB: 1930/11/21   Cancelled Treatment:    Reason Eval/Treat Not Completed: Medical issues which prohibited therapy  Medical issues which prohibited therapy--low hgb. Pt awaiting transfusion. Will hold OT for now. Will check back as schedule allows.   [place], Mickel Baas, OT Acute Rehabilitation Services [phone] [phone]    11/28/2017, 10:42 AM

## 2017-11-28 NOTE — Progress Notes (Signed)
Physical Therapy Treatment Patient Details Name: [name] MRN: [id number] DOB: 1930-05-27 Today's Date: 11/28/2017    History of Present Illness 84 YO female s/p R DA-THA revision acetabular component 11/27/17. PMH includes R DA-THA 2016, RA, OA, lung cancer, sciactica, osteopenia, sjogren's syndrome, LBBB, raynaud's, thoracotomy, thoracic sympathectomy.     PT Comments    Pt was seen for after noon visit with pt taking a walk and demonstrating better tolerance for both standing and exercises.  Reapplied ice, notified nursing of progress and plan is to continue therapy as ordered as PT awaits MD to dc as planned.  PT had recommended SNF on evaluation as pt is home alone and has need for attended gait during therapy.  Follow acutely for strengthening and gait safety/balance as tolerated.   Follow Up Recommendations  Follow surgeon's recommendation for DC plan and follow-up therapies;Supervision for mobility/OOB     Equipment Recommendations  None recommended by PT    Recommendations for Other Services       Precautions / Restrictions Precautions Precautions: Fall;Other (comment)(telemetry) Restrictions Weight Bearing Restrictions: No Other Position/Activity Restrictions: WBAT     Mobility  Bed Mobility Overal bed mobility: Needs Assistance Bed Mobility: Supine to Sit     Supine to sit: Min assist;HOB elevated     General bed mobility comments: pt assisted with scooting to EOB and then to move RLE into better sitting posture  Transfers Overall transfer level: Needs assistance Equipment used: Rolling walker (2 wheeled) Transfers: Sit to/from Stand Sit to Stand: Min assist         General transfer comment: min to power up but used better hand placement once cued  Ambulation/Gait Ambulation/Gait assistance: Min guard Gait Distance (Feet): 140 Feet Assistive device: Rolling walker (2 wheeled) Gait Pattern/deviations: Step-through pattern;Decreased stride  length;Wide base of support;Trunk flexed;Shuffle;Decreased weight shift to right Gait velocity: slower   General Gait Details: min guard to keep safe but pt was helpful with turning and controlling walker   Stairs             Wheelchair Mobility    Modified Rankin (Stroke Patients Only)       Balance Overall balance assessment: Needs assistance Sitting-balance support: Feet supported Sitting balance-Leahy Scale: Good     Standing balance support: Bilateral upper extremity supported Standing balance-Leahy Scale: Fair Standing balance comment: less than fair dynamically                            Cognition Arousal/Alertness: Awake/alert Behavior During Therapy: WFL for tasks assessed/performed Overall Cognitive Status: Within Functional Limits for tasks assessed                                        Exercises Total Joint Exercises Ankle Circles/Pumps: AAROM;AROM;Both;5 reps Quad Sets: AROM;Both;10 reps Short Arc Quad: Strengthening;Both;10 reps Heel Slides: Strengthening;Both;10 reps Hip ABduction/ADduction: AAROM;Both;10 reps;AROM    General Comments General comments (skin integrity, edema, etc.): pt has clean incision as observed      Pertinent Vitals/Pain Pain Assessment: 0-10 Pain Score: 4  Pain Location: R hip  Pain Descriptors / Indicators: Operative site guarding Pain Intervention(s): Limited activity within patient's tolerance;Monitored during session;Premedicated before session;Repositioned;Ice applied    Home Living                      Prior Function  PT Goals (current goals can now be found in the care plan section) Progress towards PT goals: Progressing toward goals    Frequency    7X/week      PT Plan Current plan remains appropriate    Co-evaluation              AM-PAC PT "6 Clicks" Daily Activity  Outcome Measure  Difficulty turning over in bed (including adjusting  bedclothes, sheets and blankets)?: Unable Difficulty moving from lying on back to sitting on the side of the bed? : Unable Difficulty sitting down on and standing up from a chair with arms (e.g., wheelchair, bedside commode, etc,.)?: Unable Help needed moving to and from a bed to chair (including a wheelchair)?: A Little Help needed walking in hospital room?: A Little Help needed climbing 3-5 steps with a railing? : A Little 6 Click Score: 12    End of Session Equipment Utilized During Treatment: Gait belt Activity Tolerance: Patient tolerated treatment well Patient left: in chair;with call bell/phone within reach;with family/visitor present Nurse Communication: Mobility status PT Visit Diagnosis: Other abnormalities of gait and mobility (R26.89);Difficulty in walking, not elsewhere classified (R26.2)     Time: 7092-9574 PT Time Calculation (min) (ACUTE ONLY): 32 min  Charges:  $Gait Training: 8-22 mins $Therapeutic Exercise: 8-22 mins                     Ramond Dial 11/28/2017, 6:30 PM   Mee Hives, PT MS Acute Rehab Dept. Number: [place] and [place]

## 2017-11-28 NOTE — Progress Notes (Signed)
CRITICAL VALUE ALERT  Critical Value: Hgb:6.9  Date & Time Notied:  11/28/2017 @ 1655  Provider Notified: Dr. Marlou Sa  Orders Received/Actions taken: orders rec' to transfuse 2 units PRBCs.  Orders placed in [facility].

## 2017-11-29 LAB — CBC
HCT: 32.9 % — ABNORMAL LOW (ref 36.0–46.0)
Hemoglobin: 10.3 g/dL — ABNORMAL LOW (ref 12.0–15.0)
MCH: 24.8 pg — ABNORMAL LOW (ref 26.0–34.0)
MCHC: 31.3 g/dL (ref 30.0–36.0)
MCV: 79.3 fL — ABNORMAL LOW (ref 80.0–100.0)
NRBC: 0 % (ref 0.0–0.2)
PLATELETS: 155 10*3/uL (ref 150–400)
RBC: 4.15 MIL/uL (ref 3.87–5.11)
RDW: 22.7 % — AB (ref 11.5–15.5)
WBC: 7 10*3/uL (ref 4.0–10.5)

## 2017-11-29 NOTE — Evaluation (Signed)
Occupational Therapy Evaluation Patient Details Name: [name] MRN: [id number] DOB: Apr 18, 1930 Today's Date: 11/29/2017    History of Present Illness 84 YO female s/p R DA-THA revision acetabular component 11/27/17. PMH includes R DA-THA 2016, RA, OA, lung cancer, sciactica, osteopenia, sjogren's syndrome, LBBB, raynaud's, thoracotomy, thoracic sympathectomy.    Clinical Impression   Pt was admitted for the above.  She is mod I at baseline and lives alone.  Reviewed working within comfort and performed ADL. Pt verbalizes understanding of all education. No further OT needs at this time    Follow Up Recommendations  (assistance for adls and initially mobility)    Equipment Recommendations  None recommended by OT    Recommendations for Other Services       Precautions / Restrictions Precautions Precautions: Fall Restrictions Other Position/Activity Restrictions: WBAT       Mobility Bed Mobility               General bed mobility comments: oob  Transfers   Equipment used: Rolling walker (2 wheeled)   Sit to Stand: Min guard         General transfer comment: for safety    Balance                                           ADL either performed or assessed with clinical judgement   ADL Overall ADL's : Needs assistance/impaired     Grooming: Wash/dry hands;Wash/dry face;Oral care;Supervision/safety;Standing   Upper Body Bathing: Set up   Lower Body Bathing: Moderate assistance;Sit to/from stand   Upper Body Dressing : Set up   Lower Body Dressing: Maximal assistance;Sit to/from stand   Toilet Transfer: Min guard;Ambulation;Comfort height toilet;RW   Toileting- Water quality scientist and Hygiene: Min guard;Sit to/from stand         General ADL Comments: performed ADL while she was in bathroom.  Pt is not interested in AE; she will have help as needed. Pt has grab bars to get into shower and she was able to step forward after  last sx ('16).  She will have assistance.     Vision         Perception     Praxis      Pertinent Vitals/Pain Pain Score: 4  Pain Location: R hip  Pain Descriptors / Indicators: Operative site guarding Pain Intervention(s): Limited activity within patient's tolerance;Monitored during session;Repositioned;Ice applied(medication coming due)     Hand Dominance     Extremity/Trunk Assessment Upper Extremity Assessment Upper Extremity Assessment: Overall WFL for tasks assessed(arthritic changes in hands)       Cervical / Trunk Assessment Cervical / Trunk Assessment: Kyphotic(scoliosis)   Communication Communication Communication: No difficulties   Cognition Arousal/Alertness: Awake/alert Behavior During Therapy: WFL for tasks assessed/performed Overall Cognitive Status: Within Functional Limits for tasks assessed                                     General Comments       Exercises     Shoulder Instructions      Home Living Family/patient expects to be discharged to:: Private residence Living Arrangements: Alone Available Help at Discharge: Family;Available PRN/intermittently               Bathroom Shower/Tub: Hospital doctor Toilet: Handicapped  height     Home Equipment: Shower seat - built in;Grab bars - tub/shower;Bedside commode;Walker - 2 wheels;Walker - 4 wheels;Kasandra Knudsen - single point   Additional Comments: daughter lives close by; she will have as much help as she needs. Her husband was on hospice and she has all DME      Prior Functioning/Environment Level of Independence: Independent with assistive device(s)        Comments: used 4WW prior to admission for ambulation as needed         OT Problem List:        OT Treatment/Interventions:      OT Goals(Current goals can be found in the care plan section) Acute Rehab OT Goals Patient Stated Goal: return to independence OT Goal Formulation: All assessment and  education complete, DC therapy  OT Frequency:     Barriers to D/C:            Co-evaluation              AM-PAC PT "6 Clicks" Daily Activity     Outcome Measure Help from another person eating meals?: None Help from another person taking care of personal grooming?: A Little Help from another person toileting, which includes using toliet, bedpan, or urinal?: A Little Help from another person bathing (including washing, rinsing, drying)?: A Lot Help from another person to put on and taking off regular upper body clothing?: A Little Help from another person to put on and taking off regular lower body clothing?: A Lot 6 Click Score: 17   End of Session    Activity Tolerance: Patient tolerated treatment well Patient left: in chair;with call bell/phone within reach;with chair alarm set;with family/visitor present  OT Visit Diagnosis: Pain Pain - Right/Left: Right Pain - part of body: Hip                Time: 7793-9030 OT Time Calculation (min): 22 min Charges:  OT General Charges $OT Visit: 1 Visit OT Evaluation $OT Eval Low Complexity: 1 Low  Lesle Chris, OTR/L Acute Rehabilitation Services [phone] [facility] pager [phone] office 11/29/2017  [place] 11/29/2017, 9:39 AM

## 2017-11-29 NOTE — Progress Notes (Signed)
Subjective: Pt stable - feels better after blood transfusion   Objective: Vital signs in last 24 hours: Temp:  [97.6 F (36.4 C)-98.4 F (36.9 C)] 98.4 F (36.9 C) (10/13 0509) Pulse Rate:  [64-80] 76 (10/13 0509) Resp:  [15-17] 15 (10/13 0509) BP: (106-142)/(54-74) 142/70 (10/13 0509) SpO2:  [95 %-100 %] 96 % (10/13 0509)  Intake/Output from previous day: 10/12 0701 - 10/13 0700 In: 2235 [P.O.:960; I.V.:630; Blood:645] Out: 2300 [Urine:2300] Intake/Output this shift: No intake/output data recorded.  Exam:  Dorsiflexion/Plantar flexion intact  Labs: Recent Labs    11/28/17 0352 11/28/17 1653 11/29/17 0336  HGB 6.9* 10.4* 10.3*   Recent Labs    11/28/17 1653 11/29/17 0336  WBC 6.0 7.0  RBC 4.26 4.15  HCT 33.6* 32.9*  PLT 159 155   Recent Labs    11/28/17 0352  NA 130*  K 4.2  CL 100  CO2 23  BUN 20  CREATININE 0.80  GLUCOSE 89  CALCIUM 8.2*   No results for input(s): LABPT, INR in the last 72 hours.  Assessment/Plan: Plan pt today - dc next week   [employer] 11/29/2017, 9:24 AM

## 2017-11-29 NOTE — Plan of Care (Signed)
  Problem: Health Behavior/Discharge Planning: Goal: Ability to manage health-related needs will improve Outcome: Progressing   Problem: Clinical Measurements: Goal: Ability to maintain clinical measurements within normal limits will improve Outcome: Progressing Goal: Will remain free from infection Outcome: Progressing Goal: Diagnostic test results will improve Outcome: Progressing Goal: Respiratory complications will improve Outcome: Progressing Goal: Cardiovascular complication will be avoided Outcome: Progressing   Problem: Activity: Goal: Risk for activity intolerance will decrease Outcome: Progressing   Problem: Coping: Goal: Level of anxiety will decrease Outcome: Progressing   Problem: Elimination: Goal: Will not experience complications related to bowel motility Outcome: Progressing Goal: Will not experience complications related to urinary retention Outcome: Progressing   Problem: Pain Managment: Goal: General experience of comfort will improve Outcome: Progressing   Problem: Safety: Goal: Ability to remain free from injury will improve Outcome: Progressing   Problem: Skin Integrity: Goal: Risk for impaired skin integrity will decrease Outcome: Progressing   Problem: Education: Goal: Knowledge of the prescribed therapeutic regimen will improve Outcome: Progressing Goal: Understanding of discharge needs will improve Outcome: Progressing Goal: Individualized Educational Video(s) Outcome: Progressing   Problem: Activity: Goal: Ability to avoid complications of mobility impairment will improve Outcome: Progressing Goal: Ability to tolerate increased activity will improve Outcome: Progressing   Problem: Clinical Measurements: Goal: Postoperative complications will be avoided or minimized Outcome: Progressing   Problem: Pain Management: Goal: Pain level will decrease with appropriate interventions Outcome: Progressing   Problem: Skin  Integrity: Goal: Will show signs of wound healing Outcome: Progressing

## 2017-11-29 NOTE — Progress Notes (Signed)
Physical Therapy Treatment Patient Details Name: [name] MRN: [id number] DOB: 1931-01-02 Today's Date: 11/29/2017    History of Present Illness 84 YO female s/p R DA-THA revision acetabular component 11/27/17. PMH includes R DA-THA 2016, RA, OA, lung cancer, sciactica, osteopenia, sjogren's syndrome, LBBB, raynaud's, thoracotomy, thoracic sympathectomy.     PT Comments    Patient is progressing well. Plans Dc tomorrow.   Follow Up Recommendations  Follow surgeon's recommendation for DC plan and follow-up therapies;Supervision for mobility/OOB     Equipment Recommendations  None recommended by PT    Recommendations for Other Services       Precautions / Restrictions Precautions Precautions: Fall Restrictions Other Position/Activity Restrictions: WBAT     Mobility  Bed Mobility Overal bed mobility: Needs Assistance Bed Mobility: Sit to Supine       Sit to supine: Min guard   General bed mobility comments: in recliner  Transfers Overall transfer level: Needs assistance Equipment used: Rolling walker (2 wheeled) Transfers: Sit to/from Stand Sit to Stand: Min guard         General transfer comment: for safety  Ambulation/Gait Ambulation/Gait assistance: Min guard Gait Distance (Feet): 160 Feet Assistive device: Rolling walker (2 wheeled) Gait Pattern/deviations: Step-through pattern     General Gait Details: posture improved   Stairs Stairs: Yes Stairs assistance: Min assist Stair Management: Step to pattern;Forwards;Two rails Number of Stairs: 4 General stair comments: daughter present for cues   Wheelchair Mobility    Modified Rankin (Stroke Patients Only)       Balance                                            Cognition Arousal/Alertness: Awake/alert                                            Exercises Total Joint Exercises Ankle Circles/Pumps: AROM;Both Short Arc Quad: AROM;Right;10  reps;Supine Heel Slides: AAROM;Right;10 reps;Supine Hip ABduction/ADduction: AAROM;Right;10 reps;Supine    General Comments        Pertinent Vitals/Pain Pain Score: 5  Pain Location: R hip  Pain Descriptors / Indicators: Operative site guarding;Burning Pain Intervention(s): Monitored during session;RN gave pain meds during session;Ice applied    Home Living                      Prior Function            PT Goals (current goals can now be found in the care plan section) Progress towards PT goals: Progressing toward goals    Frequency    7X/week      PT Plan Current plan remains appropriate    Co-evaluation              AM-PAC PT "6 Clicks" Daily Activity  Outcome Measure  Difficulty turning over in bed (including adjusting bedclothes, sheets and blankets)?: A Little Difficulty moving from lying on back to sitting on the side of the bed? : A Little Difficulty sitting down on and standing up from a chair with arms (e.g., wheelchair, bedside commode, etc,.)?: A Little Help needed moving to and from a bed to chair (including a wheelchair)?: A Little Help needed walking in hospital room?: A Little Help needed climbing 3-5 steps with  a railing? : A Lot 6 Click Score: 17    End of Session   Activity Tolerance: Patient tolerated treatment well Patient left: in chair Nurse Communication: Patient requests pain meds PT Visit Diagnosis: Unsteadiness on feet (R26.81)     Time: 6579-0383 PT Time Calculation (min) (ACUTE ONLY): 16 min  Charges:                      [name] PT Acute Rehabilitation Services Pager [phone] Office [phone]    [name] 11/29/2017, 3:52 PM

## 2017-11-29 NOTE — Progress Notes (Signed)
Physical Therapy Treatment Patient Details Name: [name] MRN: [id number] DOB: [birth year] Today's Date: 11/29/2017    History of Present Illness 84 YO female s/p R DA-THA revision acetabular component 11/27/17. PMH includes R DA-THA 2016, RA, OA, lung cancer, sciactica, osteopenia, sjogren's syndrome, LBBB, raynaud's, thoracotomy, thoracic sympathectomy.     PT Comments    The patient is progressing well. Plans Dc tomorrow.   Follow Up Recommendations  Follow surgeon's recommendation for DC plan and follow-up therapies;Supervision for mobility/OOB     Equipment Recommendations  None recommended by PT    Recommendations for Other Services       Precautions / Restrictions Precautions Precautions: Fall Restrictions Other Position/Activity Restrictions: WBAT     Mobility  Bed Mobility Overal bed mobility: Needs Assistance Bed Mobility: Sit to Supine       Sit to supine: Min guard   General bed mobility comments: assist with Right leg onto bed  Transfers Overall transfer level: Needs assistance Equipment used: Rolling walker (2 wheeled) Transfers: Sit to/from Stand Sit to Stand: Min guard         General transfer comment: for safety  Ambulation/Gait Ambulation/Gait assistance: Min guard Gait Distance (Feet): 140 Feet Assistive device: Rolling walker (2 wheeled) Gait Pattern/deviations: Step-through pattern;Trunk flexed;Antalgic     General Gait Details: cues for posture   Stairs Stairs: Yes Stairs assistance: Min assist Stair Management: Step to pattern;Forwards;Two rails Number of Stairs: 4 General stair comments: daughter present for cues   Wheelchair Mobility    Modified Rankin (Stroke Patients Only)       Balance                                            Cognition Arousal/Alertness: Awake/alert                                            Exercises Total Joint Exercises Ankle  Circles/Pumps: AROM;Both Short Arc Quad: AROM;Right;10 reps;Supine Heel Slides: AAROM;Right;10 reps;Supine Hip ABduction/ADduction: AAROM;Right;10 reps;Supine    General Comments        Pertinent Vitals/Pain Pain Score: 3  Pain Location: R hip  Pain Descriptors / Indicators: Operative site guarding Pain Intervention(s): Monitored during session;Premedicated before session;Ice applied    Home Living                      Prior Function            PT Goals (current goals can now be found in the care plan section) Progress towards PT goals: Progressing toward goals    Frequency    7X/week      PT Plan Current plan remains appropriate    Co-evaluation              AM-PAC PT "6 Clicks" Daily Activity  Outcome Measure  Difficulty turning over in bed (including adjusting bedclothes, sheets and blankets)?: A Little Difficulty moving from lying on back to sitting on the side of the bed? : A Little   Help needed moving to and from a bed to chair (including a wheelchair)?: A Little Help needed walking in hospital room?: A Lot Help needed climbing 3-5 steps with a railing? : A Lot 6 Click Score: 13    End  of Session   Activity Tolerance: Patient tolerated treatment well Patient left: in bed;with call bell/phone within reach;with family/visitor present Nurse Communication: Mobility status PT Visit Diagnosis: Unsteadiness on feet (R26.81)     Time: 3976-7341 PT Time Calculation (min) (ACUTE ONLY): 29 min  Charges:  $Gait Training: 8-22 mins $Therapeutic Exercise: 8-22 mins                     [name] PT Acute Rehabilitation Services Pager [phone] Office [phone]   [name] 11/29/2017, 3:48 PM

## 2017-11-30 LAB — TYPE AND SCREEN
ABO/RH(D): A POS
ANTIBODY SCREEN: NEGATIVE
UNIT DIVISION: 0
Unit division: 0

## 2017-11-30 LAB — CBC
HCT: 32.7 % — ABNORMAL LOW (ref 36.0–46.0)
Hemoglobin: 10.3 g/dL — ABNORMAL LOW (ref 12.0–15.0)
MCH: 24.9 pg — ABNORMAL LOW (ref 26.0–34.0)
MCHC: 31.5 g/dL (ref 30.0–36.0)
MCV: 79.2 fL — ABNORMAL LOW (ref 80.0–100.0)
PLATELETS: 165 10*3/uL (ref 150–400)
RBC: 4.13 MIL/uL (ref 3.87–5.11)
RDW: 21.8 % — ABNORMAL HIGH (ref 11.5–15.5)
WBC: 6.8 10*3/uL (ref 4.0–10.5)
nRBC: 0 % (ref 0.0–0.2)

## 2017-11-30 LAB — BPAM RBC
BLOOD PRODUCT EXPIRATION DATE: 201911022359
Blood Product Expiration Date: 201911022359
ISSUE DATE / TIME: 201910120752
ISSUE DATE / TIME: 201910121125
UNIT TYPE AND RH: 6200
Unit Type and Rh: 6200

## 2017-11-30 LAB — BODY FLUID CULTURE: Culture: NO GROWTH

## 2017-11-30 MED ORDER — ASPIRIN 81 MG PO CHEW: 81.0000 mg | CHEWABLE_TABLET | Freq: Every day | ORAL | 0 refills | Status: DC

## 2017-11-30 MED ORDER — HYDROCODONE-ACETAMINOPHEN 5-325 MG PO TABS: 1.0000 | ORAL_TABLET | ORAL | 0 refills | Status: DC | PRN

## 2017-11-30 MED ORDER — METHOCARBAMOL 500 MG PO TABS: 500.0000 mg | ORAL_TABLET | Freq: Four times a day (QID) | ORAL | 0 refills | Status: DC | PRN

## 2017-11-30 NOTE — Care Management Note (Signed)
Case Management Note  Patient Details  Name: [name] MRN: [id number] Date of Birth: 12/11/1930  Subjective/Objective:    Spoke with patient at bedside. States now she wishes to go home with support of her daughter and friends as well as PCS. She wants [facility] for [facility] services, HHPT treat and evaluate.                 Action/Plan: Contacted [facility] for the referral, they have accepted. No DME needs.  Expected Discharge Date:  11/30/17               Expected Discharge Plan:  [place]  In-House Referral:  Clinical Social Work  Discharge planning Services  CM Consult  Post Acute Care Choice:  Home Health Choice offered to:  Patient  DME Arranged:  N/A DME Agency:  NA  HH Arranged:  PT [place] Agency:  [facility] (formerly [employer])  Status of Service:  Completed, signed off  If discussed at [employer], dates discussed:    Additional Comments:  Guadalupe Maple, RN 11/30/2017, 10:32 AM

## 2017-11-30 NOTE — Progress Notes (Signed)
Patient ID: [name], female   DOB: 10-Apr-1930, 84 y.o.   MRN: [id number] Doing well overall.  Right hip stable.  Can be discharged to home today.

## 2017-11-30 NOTE — Discharge Summary (Signed)
Patient ID: [name] MRN: [id number] DOB/AGE: November 08, 1930 84 y.o.  Admit date: 11/27/2017 Discharge date: 11/30/2017  Admission Diagnoses:  Principal Problem:   Failed total hip arthroplasty [facility]) Active Problems:   Status post revision of total hip   Discharge Diagnoses:  Same  Past Medical History:  Diagnosis Date  . Arthralgia of multiple joints    followed by dr Gerilyn Nestle  . Arthritis   . Chronic constipation   . Chronic inflammatory arthritis    rhemotolgist-  dr a. Gerilyn Nestle [facility])  . Dry eyes    eye drops used   . GERD (gastroesophageal reflux disease)   . H/O discoid lupus erythematosus   . Hiatal hernia   . History of colon polyps   . Hypothyroidism   . Iron deficiency anemia   . LBBB (left bundle branch block) 2010  . Mitchell's disease (erythromelalgia) [facility])    neurologist-  dr patel  . Nocturia   . Non-small cell cancer of right lung [facility]) surgeon-- dr gerhardt/  oncologist-  dr Julien Nordmann--- per lov notes no recurrence/   11-18-2017 per pt denies any symptoms   dx 2015--  Stage IIA (T2b,N0,M0) , +EGFR  mutation in exon 21, non-small cell adenocarcinoma right upper lobe---  s/p  Right upper lobectomy , right middley wedge resection and node dissection---  no chemo or radiation therapy  . OA (osteoarthritis)    hands  . Osteoporosis   . PONV (postoperative nausea and vomiting)    likes phenergan  . Raynaud's phenomenon 1965  . Renal insufficiency   . Rheumatoid arthritis ([place])   . Sciatica   . Scoliosis   . Sjogren's syndrome ([place])   . Urgency of urination     Surgeries: Procedure(s): RIGHT HIP ACETABULAR REVISION on 11/27/2017   Consultants:   Discharged Condition: Improved  Hospital Course: [name] is an 84 y.o. female who was admitted 11/27/2017 for operative treatment ofFailed total hip arthroplasty ([place]). Patient has severe unremitting pain that affects sleep, daily activities, and work/hobbies. After pre-op  clearance the patient was taken to the operating room on 11/27/2017 and underwent  Procedure(s): [place].    Patient was given perioperative antibiotics:  Anti-infectives (From admission, onward)   Start     Dose/Rate Route Frequency Ordered Stop   11/27/17 1900  vancomycin (VANCOCIN) IVPB 1000 mg/200 mL premix     1,000 mg 200 mL/hr over 60 Minutes Intravenous Every 12 hours 11/27/17 1207 11/27/17 2017   11/27/17 0645  vancomycin (VANCOCIN) IVPB 1000 mg/200 mL premix     1,000 mg 200 mL/hr over 60 Minutes Intravenous On call to O.R. 11/27/17 4196 11/27/17 0907   11/27/17 0645  ceFAZolin (ANCEF) IVPB 2g/100 mL premix  Status:  Discontinued     2 g 200 mL/hr over 30 Minutes Intravenous On call to O.R. 11/27/17 2229 11/27/17 1201       Patient was given sequential compression devices, early ambulation, and chemoprophylaxis to prevent DVT.  Patient benefited maximally from hospital stay and there were no complications.  She did have symptomatic acute blood loss anemia and required a blood transfusion.  She otherwise did really well.  Recent vital signs:  Patient Vitals for the past 24 hrs:  BP Temp Temp src Pulse Resp SpO2  11/30/17 0529 138/69 98.4 F (36.9 C) Oral 64 16 97 %  11/29/17 2128 137/70 (!) 97.4 F (36.3 C) Oral 73 16 98 %  11/29/17 1334 131/74 98.3 F (36.8 C)  Oral 84 16 99 %     Recent laboratory studies:  Recent Labs    11/28/17 0352  11/29/17 0336 11/30/17 0445  WBC 5.4   < > 7.0 6.8  HGB 6.9*   < > 10.3* 10.3*  HCT 22.9*   < > 32.9* 32.7*  PLT 155   < > 155 165  NA 130*  --   --   --   K 4.2  --   --   --   CL 100  --   --   --   CO2 23  --   --   --   BUN 20  --   --   --   CREATININE 0.80  --   --   --   GLUCOSE 89  --   --   --   CALCIUM 8.2*  --   --   --    < > = values in this interval not displayed.     Discharge Medications:   Allergies as of 11/30/2017      Reactions   Amlodipine Rash   Prochlorperazine Edisylate  Anaphylaxis   Compazine--- tongue swells and rash   Aspirin Other (See Comments)   nose bleeds   Cymbalta [duloxetine Hcl] Diarrhea, Nausea And Vomiting, Other (See Comments)   Increased blood pressure   Pamelor [nortriptyline Hcl] Diarrhea, Nausea Only   Increased Heart rate and BP      Medication List    TAKE these medications   aspirin 81 MG chewable tablet Chew 1 tablet (81 mg total) by mouth daily.   Biotin 1000 MCG tablet Take 1,000 mcg by mouth daily.   CALCIUM 500 + D 500-125 MG-UNIT Tabs Generic drug:  Calcium Carbonate-Vitamin D Take 1 tablet by mouth daily.   estradiol 0.1 MG/GM vaginal cream Commonly known as:  ESTRACE USE AS DIRECTED What changed:    how much to take  how to take this  when to take this   gabapentin 300 MG capsule Commonly known as:  NEURONTIN TAKE 300MG IN THE MORNING, 300MG IN THE AFTERNOON, AND 600MG AT BEDTIME. What changed:  See the new instructions.   GLUCOSAMINE CHONDR 1500 COMPLX PO Take 1 capsule by mouth daily.   HYDROcodone-acetaminophen 5-325 MG tablet Commonly known as:  NORCO/VICODIN Take 1 tablet by mouth every 4 (four) hours as needed for severe pain. What changed:  when to take this   levothyroxine 88 MCG tablet Commonly known as:  SYNTHROID, LEVOTHROID TAKE 1 TABLET (88 MCG TOTAL) BY MOUTH DAILY BEFORE BREAKFAST. What changed:    when to take this  additional instructions   meloxicam 7.5 MG tablet Commonly known as:  MOBIC Take 7.5 mg by mouth 2 (two) times daily as needed for pain.   methocarbamol 500 MG tablet Commonly known as:  ROBAXIN Take 1 tablet (500 mg total) by mouth every 6 (six) hours as needed for muscle spasms.   multivitamin tablet Take 1 tablet by mouth daily.   omeprazole 20 MG capsule Commonly known as:  PRILOSEC TAKE 1 CAPSULE BY MOUTH 2 TIMES DAILY BEFORE A MEAL. What changed:  See the new instructions.   spironolactone 25 MG tablet Commonly known as:  ALDACTONE Take 12.5 mg  by mouth every morning.   STOOL SOFTENER 100 MG capsule Generic drug:  docusate sodium Take 100 mg by mouth 2 (two) times daily.            Durable Medical Equipment  (From admission,   onward)         Start     Ordered   11/27/17 1208  DME 3 n 1  Once     11/27/17 1207          Diagnostic Studies: Dg Pelvis Portable  Result Date: 11/27/2017 CLINICAL DATA:  Status post revision of total right hip replacement. EXAM: PORTABLE PELVIS 1-2 VIEWS COMPARISON:  None. FINDINGS: Total right hip replacement is identified without malalignment. Postsurgical changes including skin staple and soft tissue air are identified in the right hip. IMPRESSION: Total right hip replacement without malalignment. Electronically Signed   By: Wei-Chen  Lin M.D.   On: 11/27/2017 12:45   Dg C-arm 1-60 Min-no Report  Result Date: 11/27/2017 Fluoroscopy was utilized by the requesting physician.  No radiographic interpretation.   Dg Hip Operative Unilat W Or W/o Pelvis Right  Result Date: 11/27/2017 CLINICAL DATA:  Right acetabular revision EXAM: OPERATIVE RIGHT HIP (WITH PELVIS IF PERFORMED) 1 VIEWS TECHNIQUE: Fluoroscopic spot image(s) were submitted for interpretation post-operatively. COMPARISON:  Radiography 04/28/2014 FINDINGS: Four intraprocedural images show revised acetabular cup orientation with 3 anchoring screws. Unremarkable femoral component of the total hip arthroplasty. No evident fracture. IMPRESSION: Fluoroscopy for acetabular revision. Electronically Signed   By: Jonathon  Watts M.D.   On: 11/27/2017 11:10   Xr Pelvis 1-2 Views  Result Date: 11/12/2017 Failed right hip acetabular component.   Disposition: Discharge disposition: 01-Home or Self Care         Follow-up Information    Blackman, Christopher Y, MD Follow up in 2 week(s).   Specialty:  Orthopedic Surgery Contact information: [address] [place] [address] [phone]             Signed: Christopher Y Blackman 11/30/2017, 7:33 AM     

## 2017-11-30 NOTE — Discharge Instructions (Signed)

## 2017-11-30 NOTE — Progress Notes (Signed)
CSW consult-SNF Plan: Home  CSW will sign off.  Kathrin Greathouse, Marlinda Mike, MSW Clinical Social Worker  [phone] 11/30/2017  9:47 AM

## 2017-11-30 NOTE — Progress Notes (Signed)
Physical Therapy Treatment Patient Details Name: [name] MRN: [id number] DOB: 01-19-1931 Today's Date: 11/30/2017    History of Present Illness 84 YO female s/p R DA-THA revision acetabular component 11/27/17. PMH includes R DA-THA 2016, RA, OA, lung cancer, sciactica, osteopenia, sjogren's syndrome, LBBB, raynaud's, thoracotomy, thoracic sympathectomy.     PT Comments    Patient is progressing well. Ready for DC.  Follow Up Recommendations  Follow surgeon's recommendation for DC plan and follow-up therapies;Supervision for mobility/OOB;Home health PT     Equipment Recommendations  None recommended by PT    Recommendations for Other Services       Precautions / Restrictions Precautions Precautions: Fall Restrictions Weight Bearing Restrictions: No    Mobility  Bed Mobility   Bed Mobility: Supine to Sit       Sit to supine: Min guard      Transfers Overall transfer level: Needs assistance Equipment used: Rolling walker (2 wheeled) Transfers: Sit to/from Stand Sit to Stand: Supervision            Ambulation/Gait Ambulation/Gait assistance: Supervision Gait Distance (Feet): 180 Feet Assistive device: Rolling walker (2 wheeled) Gait Pattern/deviations: Step-through pattern Gait velocity: slower   General Gait Details: posture improved   Stairs Stairs: Yes Stairs assistance: Min guard Stair Management: Two rails;Forwards Number of Stairs: 2 General stair comments: no cues needed  for sequence,    Wheelchair Mobility    Modified Rankin (Stroke Patients Only)       Balance                                            Cognition Arousal/Alertness: Awake/alert                                            Exercises Total Joint Exercises Ankle Circles/Pumps: AROM;Both Short Arc Quad: AROM;Right;10 reps;Supine Heel Slides: AAROM;Right;10 reps;Supine Hip ABduction/ADduction: AAROM;Right;10  reps;Supine    General Comments        Pertinent Vitals/Pain Pain Score: 2  Pain Location: R hip  Pain Descriptors / Indicators: Operative site guarding;Discomfort Pain Intervention(s): Premedicated before session;Ice applied    Home Living                      Prior Function            PT Goals (current goals can now be found in the care plan section) Progress towards PT goals: Progressing toward goals    Frequency           PT Plan Current plan remains appropriate    Co-evaluation              AM-PAC PT "6 Clicks" Daily Activity  Outcome Measure  Difficulty turning over in bed (including adjusting bedclothes, sheets and blankets)?: A Little Difficulty moving from lying on back to sitting on the side of the bed? : A Little Difficulty sitting down on and standing up from a chair with arms (e.g., wheelchair, bedside commode, etc,.)?: A Little Help needed moving to and from a bed to chair (including a wheelchair)?: A Little Help needed walking in hospital room?: A Little Help needed climbing 3-5 steps with a railing? : A Lot 6 Click Score: 17    End of  Session   Activity Tolerance: Patient tolerated treatment well Patient left: in chair;with call bell/phone within reach Nurse Communication: Mobility status(rash on right inner thigh) PT Visit Diagnosis: Unsteadiness on feet (R26.81)     Time: 0158-6825 PT Time Calculation (min) (ACUTE ONLY): 37 min  Charges:  $Gait Training: 8-22 mins $Therapeutic Exercise: 8-22 mins               [name] PT Acute Rehabilitation Services Pager [phone] Office [phone]    [name] 11/30/2017, 11:47 AM

## 2017-12-02 ENCOUNTER — Telehealth (INDEPENDENT_AMBULATORY_CARE_PROVIDER_SITE_OTHER): Payer: Self-pay | Admitting: Orthopaedic Surgery

## 2017-12-02 LAB — ANAEROBIC CULTURE

## 2017-12-02 NOTE — Telephone Encounter (Signed)
Verbal orders left on VM 

## 2017-12-02 NOTE — Telephone Encounter (Signed)
Debbie from [place] at home left a message requesting VO:  2x a week for 1 week 3x a week for 1 week.  TE#[phone].  Thank you.

## 2017-12-10 ENCOUNTER — Encounter (INDEPENDENT_AMBULATORY_CARE_PROVIDER_SITE_OTHER): Payer: Self-pay | Admitting: Orthopaedic Surgery

## 2017-12-10 ENCOUNTER — Ambulatory Visit (INDEPENDENT_AMBULATORY_CARE_PROVIDER_SITE_OTHER): Payer: Medicare Other | Admitting: Orthopaedic Surgery

## 2017-12-10 DIAGNOSIS — Z96649 Presence of unspecified artificial hip joint: Secondary | ICD-10-CM

## 2017-12-10 MED ORDER — METHOCARBAMOL 500 MG PO TABS: 500.0000 mg | ORAL_TABLET | Freq: Four times a day (QID) | ORAL | 0 refills | Status: DC | PRN

## 2017-12-10 NOTE — Progress Notes (Signed)
Patient is a pleasant 84 year old female who is 2 weeks tomorrow status post revision of a failed right hip acetabular component.  There was definitely aseptic loosening.  We found no evidence of infection.  We did change her to a new acetabular implant with 3 screws Her.  She is doing well overall.  She does feel like her leg length is improved and she looks better overall.  This is not a recall component as well.  On exam her staple line looks good to remove the staples and placed Steri-Strips.  Her leg lengths are equal.  She is walking much better more upright.  Notes from therapy state that they want to continue her home therapy through next week and I agree with this.  She does need some more Robaxin called into her pharmacy..  She can stop her aspirin.  All question concerns were answered and addressed.  We will see her back in 4 weeks I would like a standing low AP pelvis.

## 2018-01-02 ENCOUNTER — Emergency Department (HOSPITAL_COMMUNITY): Payer: Medicare Other

## 2018-01-02 ENCOUNTER — Other Ambulatory Visit: Payer: Self-pay

## 2018-01-02 ENCOUNTER — Emergency Department (HOSPITAL_COMMUNITY)
Admission: EM | Admit: 2018-01-02 | Discharge: 2018-01-02 | Disposition: A | Discharge status: Home / Self Care | Payer: Medicare Other | Attending: Emergency Medicine | Admitting: Emergency Medicine

## 2018-01-02 DIAGNOSIS — S79911A Unspecified injury of right hip, initial encounter: Secondary | ICD-10-CM | POA: Diagnosis present

## 2018-01-02 DIAGNOSIS — Z96641 Presence of right artificial hip joint: Secondary | ICD-10-CM | POA: Diagnosis not present

## 2018-01-02 DIAGNOSIS — S72111A Displaced fracture of greater trochanter of right femur, initial encounter for closed fracture: Secondary | ICD-10-CM | POA: Insufficient documentation

## 2018-01-02 DIAGNOSIS — Y999 Unspecified external cause status: Secondary | ICD-10-CM | POA: Insufficient documentation

## 2018-01-02 DIAGNOSIS — Y92009 Unspecified place in unspecified non-institutional (private) residence as the place of occurrence of the external cause: Secondary | ICD-10-CM | POA: Insufficient documentation

## 2018-01-02 DIAGNOSIS — W1830XA Fall on same level, unspecified, initial encounter: Secondary | ICD-10-CM | POA: Diagnosis not present

## 2018-01-02 DIAGNOSIS — Y9389 Activity, other specified: Secondary | ICD-10-CM | POA: Diagnosis not present

## 2018-01-02 DIAGNOSIS — E039 Hypothyroidism, unspecified: Secondary | ICD-10-CM | POA: Insufficient documentation

## 2018-01-02 DIAGNOSIS — Z7982 Long term (current) use of aspirin: Secondary | ICD-10-CM | POA: Insufficient documentation

## 2018-01-02 DIAGNOSIS — S73004A Unspecified dislocation of right hip, initial encounter: Secondary | ICD-10-CM | POA: Diagnosis not present

## 2018-01-02 DIAGNOSIS — Z79899 Other long term (current) drug therapy: Secondary | ICD-10-CM | POA: Insufficient documentation

## 2018-01-02 MED ORDER — KETAMINE HCL 50 MG/5ML IJ SOSY
30.0000 mg | PREFILLED_SYRINGE | Freq: Once | INTRAMUSCULAR | Status: AC
  Administered 2018-01-02: 30 mg via INTRAVENOUS
  Filled 2018-01-02: qty 5

## 2018-01-02 MED ORDER — PROPOFOL 10 MG/ML IV BOLUS
20.0000 mg | Freq: Once | INTRAVENOUS | Status: AC
  Administered 2018-01-02: 20 mg via INTRAVENOUS
  Filled 2018-01-02: qty 20

## 2018-01-02 MED ORDER — ONDANSETRON HCL 4 MG/2ML IJ SOLN
4.0000 mg | Freq: Once | INTRAMUSCULAR | Status: AC
  Administered 2018-01-02: 4 mg via INTRAVENOUS
  Filled 2018-01-02: qty 2

## 2018-01-02 MED ORDER — FENTANYL CITRATE (PF) 100 MCG/2ML IJ SOLN
50.0000 ug | Freq: Once | INTRAMUSCULAR | Status: AC
  Administered 2018-01-02: 50 ug via INTRAVENOUS
  Filled 2018-01-02: qty 2

## 2018-01-02 MED ORDER — HYDROCODONE-ACETAMINOPHEN 5-325 MG PO TABS
1.0000 | ORAL_TABLET | Freq: Once | ORAL | Status: AC
  Administered 2018-01-02: 1 via ORAL
  Filled 2018-01-02: qty 1

## 2018-01-02 MED ORDER — SODIUM CHLORIDE 0.9 % IV SOLN
INTRAVENOUS | Status: DC
  Administered 2018-01-02: 13:00:00 via INTRAVENOUS

## 2018-01-02 NOTE — Sedation Documentation (Signed)
Right hip felt to relocate. Will Xray to verify placement.

## 2018-01-02 NOTE — Discharge Instructions (Addendum)
Wear the knee immobilizer at all times until you can follow-up with Dr Ninfa Linden this upcoming week.

## 2018-01-02 NOTE — ED Notes (Signed)
Bed: ID56 Expected date:  Expected time:  Means of arrival:  Comments: Fall, hip pain, shortening and rotation noted

## 2018-01-02 NOTE — ED Provider Notes (Signed)
[name] DEPT Provider Note   CSN: [id number] Arrival date & time: 01/02/18  1236     History   Chief Complaint Chief Complaint  Patient presents with  . Fall    HPI [name] is a 84 y.o. female.  HPI   84 year old female with right hip pain.  Mechanical fall at home.  She was getting dressed when she lost her balance and fell.  Severe right hip pain as result of this.  She is unable to get up from the floor because of it.  She is status post right hip acetabular revision on 11/27/2017.  She reports that she may recovering well up to this point.  She denies any other injuries from the fall.  Past Medical History:  Diagnosis Date  . Arthralgia of multiple joints    followed by dr Gerilyn Nestle  . Arthritis   . Chronic constipation   . Chronic inflammatory arthritis    rhemotolgist-  dr a. Gerilyn Nestle [facility])  . Dry eyes    eye drops used   . GERD (gastroesophageal reflux disease)   . H/O discoid lupus erythematosus   . Hiatal hernia   . History of colon polyps   . Hypothyroidism   . Iron deficiency anemia   . LBBB (left bundle branch block) 2010  . Mitchell's disease (erythromelalgia) [facility])    neurologist-  dr patel  . Nocturia   . Non-small cell cancer of right lung [facility]) surgeon-- dr gerhardt/  oncologist-  dr Julien Nordmann--- per lov notes no recurrence/   11-18-2017 per pt denies any symptoms   dx 2015--  Stage IIA (T2b,N0,M0) , +EGFR  mutation in exon 21, non-small cell adenocarcinoma right upper lobe---  s/p  Right upper lobectomy , right middley wedge resection and node dissection---  no chemo or radiation therapy  . OA (osteoarthritis)    hands  . Osteoporosis   . PONV (postoperative nausea and vomiting)    likes phenergan  . Raynaud's phenomenon 1965  . Renal insufficiency   . Rheumatoid arthritis ([place])   . Sciatica   . Scoliosis   . Sjogren's syndrome ([place])   . Urgency of urination     Patient Active Problem  List   Diagnosis Date Noted  . Failed total hip arthroplasty ([place]) 11/27/2017  . Status post revision of total hip 11/27/2017  . Elevated cholesterol 10/11/2015  . 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[place]) 10/04/2014  . Bilateral leg edema 08/01/2014  . Elevated BP 08/01/2014  . Dehydration 08/01/2014  . Rheumatoid arthritis involving multiple joints ([place]) 05/30/2014  . Osteoarthritis of right hip 04/28/2014  . Status post total replacement of right hip 04/28/2014  . Long-term use of high-risk medication 11/11/2013  . Routine general medical examination at a health care facility 11/11/2013  . Anemia, unspecified 11/11/2013  . Neuropathic pain 07/07/2013  . Constipation due to pain medication 07/07/2013  . Protein-calorie malnutrition, severe ([place]) 06/08/2013  . Lung cancer, Right upper lobe 05/08/2013  . Anterior chest wall pain 09/18/2011  . Breast lump 09/18/2011  . Sciatica of right side 08/13/2011  . Sinusitis 06/13/2011  . Cerumen impaction 08/20/2010  . SINUSITIS - ACUTE-NOS 04/30/2010  . Osteopenia 03/07/2010  . PARESTHESIA 03/07/2010  . CONTUSION OF LOWER LEG 11/29/2009  . DIZZINESS 10/01/2009  . Backache 03/16/2009  . CT, CHEST, ABNORMAL 12/18/2008  . ABNORMAL ECHOCARDIOGRAM 12/14/2008  . URI 11/29/2008  . [place] SYNDROME 11/29/2008  . CONTACT DERMATITIS 11/02/2006  . HYPOGLYCEMIA 06/29/2006  .  RAYNAUD'S DISEASE 06/29/2006    Past Surgical History:  Procedure Laterality Date  . ANTERIOR HIP REVISION Right 11/27/2017   Procedure: RIGHT HIP ACETABULAR REVISION;  Surgeon: [name], MD;  Location: [facility];  Service: Orthopedics;  Laterality: Right;  . APPENDECTOMY  1950s  . CARDIOVASCULAR STRESS TEST  12/2008    mild fixed basal to mid septal perfusion defect felt likely due to artifact from LBBB, no ischemia, EF 58%  . COLONOSCOPY    . LYMPH NODE DISSECTION Right 06/07/2013   Procedure: LYMPH NODE DISSECTION;  Surgeon: [name], MD;  Location: [facility];  Service: Thoracic;  Laterality: Right;  . [place]   "large incision from chest to up to shoulder, the nerves were tied together, for raynaud's  . THORACOTOMY  06/07/2013   Procedure: MINI/LIMITED THORACOTOMY; right middle lobe wedge resection;  Surgeon: [name], MD;  Location: [facility];  Service: Thoracic;;  . TONSILLECTOMY  child  . TOTAL ABDOMINAL HYSTERECTOMY  1980's    W/ BSO  . TOTAL HIP ARTHROPLASTY Right 04/28/2014   Procedure: RIGHT TOTAL HIP ARTHROPLASTY ANTERIOR APPROACH;  Surgeon: [name], MD;  Location: [facility];  Service: Orthopedics;  Laterality: Right;  . TRANSTHORACIC ECHOCARDIOGRAM  12/11/2008   ef 51-88%, grade 1 diastolic dysfunction/  mild LAE/  mild AR and MR/  trivial TR  . VIDEO ASSISTED THORACOSCOPY (VATS)/WEDGE RESECTION Right 06/07/2013   Procedure: VIDEO ASSISTED THORACOSCOPY (VATS)/right upper lobectomy, On Q;  Surgeon: [name], MD;  Location: [place] [facility];  Service: Thoracic;  Laterality: Right;  [place] VIDEO BRONCHOSCOPY N/A 06/07/2013   Procedure: VIDEO BRONCHOSCOPY;  Surgeon: [name], MD;  Location[facility];  Service: Thoracic;  Laterality: N/A;  . VIDEO BRONCHOSCOPY WITH ENDOBRONCHIAL NAVIGATION N/A 05/04/2013   Procedure: VIDEO BRONCHOSCOPY WITH ENDOBRONCHIAL NAVIGATION;  Surgeon: [name], MD;  Location: [facility];  Service: Thoracic;  Laterality: N/A;     OB History    Gravida  4   Para  2   Term      Preterm      AB  2   Living  2     SAB  2   TAB      Ectopic      Multiple      Live Births               Home Medications    Prior to Admission medications   Medication Sig Start Date End Date Taking? Authorizing Provider  aspirin 81 MG chewable tablet Chew 1 tablet (81 mg total) by mouth daily. 11/30/17   [name], MD  Biotin 1000 MCG tablet Take 1,000 mcg by mouth daily.     [provider]  Calcium Carbonate-Vitamin D (CALCIUM 500 + D) 500-125  MG-UNIT TABS Take 1 tablet by mouth daily.     [provider]  docusate sodium (STOOL SOFTENER) 100 MG capsule Take 100 mg by mouth 2 (two) times daily.      [provider]  estradiol (ESTRACE VAGINAL) 0.1 MG/GM vaginal cream USE AS DIRECTED Patient taking differently: Place 1 Applicatorful vaginally every 6 (six) months. USE AS DIRECTED 07/02/16   Midge Minium, MD  gabapentin (NEURONTIN) 300 MG capsule TAKE '300MG'$  IN THE MORNING, '300MG'$  IN THE AFTERNOON, AND '600MG'$  AT BEDTIME. Patient taking differently: Take 300-600 mg by mouth See admin instructions. Take '300mg'$  in the morning, '300mg'$  in the afternoon, and '600mg'$  at bedtime.  12/10/16   Patel, Arvin Collard K, DO  Glucosamine-Chondroit-Vit C-Mn (GLUCOSAMINE CHONDR 1500 COMPLX PO) Take 1 capsule by mouth daily.     [provider]  HYDROcodone-acetaminophen (NORCO/VICODIN) 5-325 MG tablet Take 1 tablet by mouth every 4 (four) hours as needed for severe pain. 11/30/17   [name], MD  levothyroxine (SYNTHROID, LEVOTHROID) 88 MCG tablet TAKE 1 TABLET (88 MCG TOTAL) BY MOUTH DAILY BEFORE BREAKFAST. Patient taking differently: Take 88 mcg by mouth at bedtime. Per pt takes at 10pm 11/03/17   Leamon Arnt, MD  meloxicam (MOBIC) 7.5 MG tablet Take 7.5 mg by mouth 2 (two) times daily as needed for pain.    [provider]  methocarbamol (ROBAXIN) 500 MG tablet Take 1 tablet (500 mg total) by mouth every 6 (six) hours as needed for muscle spasms. 12/10/17   [name], MD  Multiple Vitamin (MULTIVITAMIN) tablet Take 1 tablet by mouth daily.      [provider]  omeprazole (PRILOSEC) 20 MG capsule TAKE 1 CAPSULE BY MOUTH 2 TIMES DAILY BEFORE A MEAL. Patient taking differently: Take 20 mg by mouth 2 (two) times daily before a meal. TAKE 1 CAPSULE BY MOUTH 2 TIMES DAILY BEFORE A MEAL. 11/03/17   Leamon Arnt, MD  spironolactone (ALDACTONE) 25 MG tablet Take 12.5 mg by mouth every morning.   07/12/14   [provider]    Family History Family History  Problem Relation Age of Onset  . Coronary artery disease Father   . Colon cancer Father   . Diabetes Father   . Cancer Father        colon  . Other Mother 30       MVA  . Healthy Sister   . Other Brother        Killed on war  . Healthy Daughter   . Esophageal cancer Neg Hx   . Kidney disease Neg Hx   . Liver disease Neg Hx     Social History Social History   Tobacco Use  . Smoking status: Never Smoker  . Smokeless tobacco: Never Used  Substance Use Topics  . Alcohol use: Not Currently    Frequency: Never  . Drug use: Never     Allergies   Amlodipine; Prochlorperazine edisylate; Aspirin; Cymbalta [duloxetine hcl]; and Pamelor [nortriptyline hcl]   Review of Systems Review of Systems  All systems reviewed and negative, other than as noted in HPI.  Physical Exam Updated Vital Signs BP (!) 171/78 (BP Location: Right Arm)   Pulse 88   Temp 98.5 F (36.9 C) (Oral)   Resp 18   Ht '5\' 2"'$  (1.575 m)   Wt 47.2 kg   SpO2 100%   BMI 19.02 kg/m   Physical Exam  Constitutional: She appears well-developed and well-nourished. No distress.  HENT:  Head: Normocephalic and atraumatic.  Eyes: Conjunctivae are normal. Right eye exhibits no discharge. Left eye exhibits no discharge.  Neck: Neck supple.  Cardiovascular: Normal rate, regular rhythm and normal heart sounds. Exam reveals no gallop and no friction rub.  No murmur heard. Pulmonary/Chest: Effort normal and breath sounds normal. No respiratory distress.  Abdominal: Soft. She exhibits no distension. There is no tenderness.  Musculoskeletal: She exhibits no edema or tenderness.  Right hip is somewhat shortened and externally rotated.  Cannot range secondary severe pain.  Surgical incision is intact.  She is neurovascularly intact.  Neurological: She is alert.  Skin: Skin is warm and dry.  Psychiatric:  She has a normal mood and affect. Her  behavior is normal. Thought content normal.  Nursing note and vitals reviewed.    ED Treatments / Results  Labs (all labs ordered are listed, but only abnormal results are displayed) Labs Reviewed - No data to display  EKG None  Radiology Dg Hip Unilat W Or Wo Pelvis 1 View Right  Result Date: 01/02/2018 CLINICAL DATA:  Status post reduction of a right hip dislocation. EXAM: DG HIP (WITH OR WITHOUT PELVIS) 1V RIGHT COMPARISON:  None. FINDINGS: A single frontal view of the right hip demonstrates reduction of the previously seen dislocation of the femoral head component of the total hip prosthesis. The previously seen greater trochanter fracture is not visible on this image. Mild protrusio acetabuli is again demonstrated. IMPRESSION: Status post reduction of the previously seen dislocation. The previously demonstrated greater trochanter fracture is not visible on this image. Electronically Signed   By: Claudie Revering M.D.   On: 01/02/2018 14:25   Dg Hip Unilat W Or Wo Pelvis 2-3 Views Right  Result Date: 01/02/2018 CLINICAL DATA:  Right hip pain.  Suspect dislocation. EXAM: DG HIP (WITH OR WITHOUT PELVIS) 2-3V RIGHT COMPARISON:  11/27/2017 FINDINGS: Superior dislocation of the total hip arthroplasty. Displaced fracture of the right femoral greater trochanter. Pelvic bony structures are intact. There appears to be severe dextroscoliosis in the lumbar spine. No gross abnormality to the left hip joint. IMPRESSION: Dislocation of the right hip arthroplasty and displaced fracture of the right femur greater trochanter. Electronically Signed   By: Markus Daft M.D.   On: 01/02/2018 13:22    Procedures .Sedation Date/Time: 01/02/2018 1:50 PM Performed by: Virgel Manifold, MD Authorized by: Virgel Manifold, MD   Consent:    Consent obtained:  Verbal   Consent given by:  Patient   Risks discussed:  Allergic reaction, dysrhythmia, inadequate sedation, nausea, prolonged hypoxia resulting in organ  damage, prolonged sedation necessitating reversal, respiratory compromise necessitating ventilatory assistance and intubation and vomiting   Alternatives discussed:  Analgesia without sedation, anxiolysis and regional anesthesia Universal protocol:    Procedure explained and questions answered to patient or proxy's satisfaction: yes     Relevant documents present and verified: yes     Test results available and properly labeled: yes     Imaging studies available: yes     Required blood products, implants, devices, and special equipment available: yes     Site/side marked: yes     Immediately prior to procedure a time out was called: yes     Patient identity confirmation method:  Verbally with patient Indications:    Procedure necessitating sedation performed by:  Physician performing sedation Pre-sedation assessment:    Time since last food or drink:  .   ASA classification: class 2 - patient with mild systemic disease     Neck mobility: normal     Mouth opening:  2 finger widths   Thyromental distance:  3 finger widths   Mallampati score:  II - soft palate, uvula, fauces visible   Pre-sedation assessments completed and reviewed: airway patency, cardiovascular function, hydration status, mental status, nausea/vomiting, pain level, respiratory function and temperature   Immediate pre-procedure details:    Reassessment: Patient reassessed immediately prior to procedure     Reviewed: vital signs, relevant labs/tests and NPO status     Verified: bag valve mask available, emergency equipment available, intubation equipment available, IV patency confirmed, oxygen available and suction available   Procedure details (see MAR for  exact dosages):    Preoxygenation:  Nasal cannula   Sedation:  Propofol and ketamine   Intra-procedure monitoring:  Blood pressure monitoring, cardiac monitor, continuous pulse oximetry, frequent LOC assessments, frequent vital sign checks and continuous capnometry    Intra-procedure events: none     Total Provider sedation time (minutes):  35 Post-procedure details:    Attendance: Constant attendance by certified staff until patient recovered     Recovery: Patient returned to pre-procedure baseline     Post-sedation assessments completed and reviewed: airway patency, cardiovascular function, hydration status, mental status, nausea/vomiting, pain level, respiratory function and temperature     Patient is stable for discharge or admission: yes     Patient tolerance:  Tolerated well, no immediate complications Reduction of dislocation Date/Time: 01/02/2018 2:00 PM Performed by: Virgel Manifold, MD Authorized by: Virgel Manifold, MD  Consent: Verbal consent obtained. Written consent obtained. Risks and benefits: risks, benefits and alternatives were discussed Consent given by: patient Patient understanding: patient states understanding of the procedure being performed Patient consent: the patient's understanding of the procedure matches consent given Procedure consent: procedure consent matches procedure scheduled Relevant documents: relevant documents present and verified Test results: test results available and properly labeled Imaging studies: imaging studies available Patient identity confirmed: verbally with patient and provided demographic data Time out: Immediately prior to procedure a "time out" was called to verify the correct patient, procedure, equipment, support staff and site/side marked as required. Local anesthesia used: no  Anesthesia: Local anesthesia used: no  Sedation: Patient sedated: yes Sedation type: moderate (conscious) sedation Sedatives: ketamine and propofol Analgesia: fentanyl and ketamine  Patient tolerance: Patient tolerated the procedure well with no immediate complications Comments: Longitudinal traction, internal rotation and hip flexion.     (including critical care time)  Medications Ordered in ED Medications    0.9 %  sodium chloride infusion ( Intravenous Stopped 01/02/18 1500)  fentaNYL (SUBLIMAZE) injection 50 mcg (50 mcg Intravenous Given 01/02/18 1328)  propofol (DIPRIVAN) 10 mg/mL bolus/IV push 20 mg (20 mg Intravenous Given 01/02/18 1351)  ketamine 50 mg in normal saline 5 mL (10 mg/mL) syringe (30 mg Intravenous Given 01/02/18 1351)  ondansetron (ZOFRAN) injection 4 mg (4 mg Intravenous Given 01/02/18 1351)  HYDROcodone-acetaminophen (NORCO/VICODIN) 5-325 MG per tablet 1 tablet (1 tablet Oral Given 01/02/18 1500)     Initial Impression / Assessment and Plan / ED Course  I have reviewed the triage vital signs and the nursing notes.  Pertinent labs & imaging results that were available during my care of the patient were reviewed by me and considered in my medical decision making (see chart for details).     87yF with R prosthetic hip dislocation. Reduced. Remains NVI. Knee immobilizer. FU with Dr Ninfa Linden this upcoming week. She says she still has pain medication and muscle relaxants from her recent surgery to take if needed.    Final Clinical Impressions(s) / ED Diagnoses   Final diagnoses:  Dislocation of right hip, initial encounter [facility])    ED Discharge Orders    None       Virgel Manifold, MD 01/02/18 1535

## 2018-01-02 NOTE — ED Triage Notes (Signed)
Pt presents to ER via [facility] after a fall today. Pt states she was attempting to put on a sweater when she lost her balance and fell. Pt states she heard a pop when she landed. Pt recently had a Right hip replacement 5weeks ago performed by Dr. Ninfa Linden. Pt denies hitting her head or LOC. Pt complains of right hip pain 7/10 at this time. Pt recently stopped taking a baby aspirin x 2 days ago.

## 2018-01-03 NOTE — Progress Notes (Deleted)
[place] at [facility] [address], [place], [place] [address] [phone]  Date:  01/04/2018   Name:  [name]   DOB:  07-22-1930   MRN:  [id number]  PCP:  Darreld Mclean, MD    Chief Complaint: No chief complaint on file.   History of Present Illness:  [name] is a 84 y.o. very pleasant female patient who presents with the following:  Following up today after her hip replacement surgery Hospital Course: CAMMY SANJURJO is an 84 y.o. female who was admitted 11/27/2017 for operative treatment ofFailed total hip arthroplasty ([place]). Patient has severe unremitting pain that affects sleep, daily activities, and work/hobbies. After pre-op clearance the patient was taken to the operating room on 11/27/2017 and underwent  Procedure(s): [place].   She did require a blood transfusion as her Hg got down to 6 at one point   She then presented to the Er this past Saturday having dislocated her new hip- this was reduced  Patient Active Problem List   Diagnosis Date Noted  . Failed total hip arthroplasty ([place]) 11/27/2017  . Status post revision of total hip 11/27/2017  . Elevated cholesterol 10/11/2015  . 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[place]) 10/04/2014  . Bilateral leg edema 08/01/2014  . Elevated BP 08/01/2014  . Dehydration 08/01/2014  . Rheumatoid arthritis involving multiple joints ([place]) 05/30/2014  . Osteoarthritis of right hip 04/28/2014  . Status post total replacement of right hip 04/28/2014  . Long-term use of high-risk medication 11/11/2013  . Routine general medical examination at a health care facility 11/11/2013  . Anemia, unspecified 11/11/2013  . Neuropathic pain 07/07/2013  . Constipation due to pain medication 07/07/2013  . Protein-calorie malnutrition, severe ([place]) 06/08/2013  . Lung cancer, Right upper lobe 05/08/2013  . Anterior chest wall pain 09/18/2011  . Breast lump  09/18/2011  . Sciatica of right side 08/13/2011  . Sinusitis 06/13/2011  . Cerumen impaction 08/20/2010  . SINUSITIS - ACUTE-NOS 04/30/2010  . Osteopenia 03/07/2010  . PARESTHESIA 03/07/2010  . CONTUSION OF LOWER LEG 11/29/2009  . DIZZINESS 10/01/2009  . Backache 03/16/2009  . CT, CHEST, ABNORMAL 12/18/2008  . ABNORMAL ECHOCARDIOGRAM 12/14/2008  . URI 11/29/2008  . [place] SYNDROME 11/29/2008  . CONTACT DERMATITIS 11/02/2006  . HYPOGLYCEMIA 06/29/2006  . RAYNAUD'S DISEASE 06/29/2006    Past Medical History:  Diagnosis Date  . Arthralgia of multiple joints    followed by dr Gerilyn Nestle  . Arthritis   . Chronic constipation   . Chronic inflammatory arthritis    rhemotolgist-  dr a. Gerilyn Nestle [facility])  . Dry eyes    eye drops used   . GERD (gastroesophageal reflux disease)   . H/O discoid lupus erythematosus   . Hiatal hernia   . History of colon polyps   . Hypothyroidism   . Iron deficiency anemia   . LBBB (left bundle branch block) 2010  . Mitchell's disease (erythromelalgia) [facility])    neurologist-  dr patel  . Nocturia   . Non-small cell cancer of right lung [facility]) surgeon-- dr gerhardt/  oncologist-  dr Julien Nordmann--- per lov notes no recurrence/   11-18-2017 per pt denies any symptoms   dx 2015--  Stage IIA (T2b,N0,M0) , +EGFR  mutation in exon 21, non-small cell adenocarcinoma right upper lobe---  s/p  Right upper lobectomy , right middley wedge resection and node dissection---  no chemo or radiation therapy  .  OA (osteoarthritis)    hands  . Osteoporosis   . PONV (postoperative nausea and vomiting)    likes phenergan  . Raynaud's phenomenon 1965  . Renal insufficiency   . Rheumatoid arthritis ([place])   . Sciatica   . Scoliosis   . Sjogren's syndrome ([place])   . Urgency of urination     Past Surgical History:  Procedure Laterality Date  . ANTERIOR HIP REVISION Right 11/27/2017   Procedure: RIGHT HIP ACETABULAR REVISION;  Surgeon: [name],  MD;  Location: [facility];  Service: Orthopedics;  Laterality: Right;  . APPENDECTOMY  1950s  . CARDIOVASCULAR STRESS TEST  12/2008    mild fixed basal to mid septal perfusion defect felt likely due to artifact from LBBB, no ischemia, EF 58%  . COLONOSCOPY    . LYMPH NODE DISSECTION Right 06/07/2013   Procedure: LYMPH NODE DISSECTION;  Surgeon: [name], MD;  Location: [facility];  Service: Thoracic;  Laterality: Right;  . [place]   "large incision from chest to up to shoulder, the nerves were tied together, for raynaud's  . THORACOTOMY  06/07/2013   Procedure: MINI/LIMITED THORACOTOMY; right middle lobe wedge resection;  Surgeon: [name], MD;  Location: [place];  Service: Thoracic;;  . TONSILLECTOMY  child  . TOTAL ABDOMINAL HYSTERECTOMY  1980's    W/ BSO  . TOTAL HIP ARTHROPLASTY Right 04/28/2014   Procedure: RIGHT TOTAL HIP ARTHROPLASTY ANTERIOR APPROACH;  Surgeon: [name], MD;  Location: [facility];  Service: Orthopedics;  Laterality: Right;  . TRANSTHORACIC ECHOCARDIOGRAM  12/11/2008   ef 61-60%, grade 1 diastolic dysfunction/  mild LAE/  mild AR and MR/  trivial TR  . VIDEO ASSISTED THORACOSCOPY (VATS)/WEDGE RESECTION Right 06/07/2013   Procedure: VIDEO ASSISTED THORACOSCOPY (VATS)/right upper lobectomy, On Q;  Surgeon: [name], MD;  Location: [place];  Service: Thoracic;  Laterality: Right;  [place] VIDEO BRONCHOSCOPY N/A 06/07/2013   Procedure: VIDEO BRONCHOSCOPY;  Surgeon: [name], MD;  Location: [facility];  Service: Thoracic;  Laterality: N/A;  . VIDEO BRONCHOSCOPY WITH ENDOBRONCHIAL NAVIGATION N/A 05/04/2013   Procedure: VIDEO BRONCHOSCOPY WITH ENDOBRONCHIAL NAVIGATION;  Surgeon: [name], MD;  Location: [place];  Service: Thoracic;  Laterality: N/A;    Social History   Tobacco Use  . Smoking status: Never Smoker  . Smokeless tobacco: Never Used  Substance Use Topics  . Alcohol use: Not Currently    Frequency: Never  .  Drug use: Never    Family History  Problem Relation Age of Onset  . Coronary artery disease Father   . Colon cancer Father   . Diabetes Father   . Cancer Father        colon  . Other Mother 32       MVA  . Healthy Sister   . Other Brother        Killed on war  . Healthy Daughter   . Esophageal cancer Neg Hx   . Kidney disease Neg Hx   . Liver disease Neg Hx     Allergies  Allergen Reactions  . Amlodipine Rash  . Prochlorperazine Edisylate Anaphylaxis    Compazine--- tongue swells and rash  . Aspirin Other (See Comments)    nose bleeds  . Cymbalta [Duloxetine Hcl] Diarrhea, Nausea And Vomiting and Other (See Comments)    Increased blood pressure  . Pamelor [Nortriptyline Hcl] Diarrhea and Nausea Only    Increased Heart rate and BP  Medication list has been reviewed and updated.  Current Outpatient Medications on File Prior to Visit  Medication Sig Dispense Refill  . aspirin 81 MG chewable tablet Chew 1 tablet (81 mg total) by mouth daily. 30 tablet 0  . Biotin 1000 MCG tablet Take 1,000 mcg by mouth daily.     . Calcium Carbonate-Vitamin D (CALCIUM 500 + D) 500-125 MG-UNIT TABS Take 1 tablet by mouth daily.     Mar[place] Kitchen docusate sodium (STOOL SOFTENER) 100 MG capsule Take 100 mg by mouth 2 (two) times daily.      Marland Kitchen estradiol (ESTRACE VAGINAL) 0.1 MG/GM vaginal cream USE AS DIRECTED (Patient taking differently: Place 1 Applicatorful vaginally every 6 (six) months. USE AS DIRECTED) 42.5 g 2  . gabapentin (NEURONTIN) 300 MG capsule TAKE '300MG'$  IN THE MORNING, '300MG'$  IN THE AFTERNOON, AND '600MG'$  AT BEDTIME. (Patient taking differently: Take 300-600 mg by mouth See admin instructions. Take '300mg'$  in the morning, '300mg'$  in the afternoon, and '600mg'$  at bedtime.) 120 capsule 5  . Glucosamine-Chondroit-Vit C-Mn (GLUCOSAMINE CHONDR 1500 COMPLX PO) Take 1 capsule by mouth daily.     Marland Kitchen HYDROcodone-acetaminophen (NORCO/VICODIN) 5-325 MG tablet Take 1 tablet by mouth every 4 (four) hours as needed  for severe pain. 40 tablet 0  . levothyroxine (SYNTHROID, LEVOTHROID) 88 MCG tablet TAKE 1 TABLET (88 MCG TOTAL) BY MOUTH DAILY BEFORE BREAKFAST. (Patient taking differently: Take 88 mcg by mouth at bedtime. Per pt takes at 10pm) 90 tablet 1  . meloxicam (MOBIC) 7.5 MG tablet Take 7.5 mg by mouth 2 (two) times daily as needed for pain.    . methocarbamol (ROBAXIN) 500 MG tablet Take 1 tablet (500 mg total) by mouth every 6 (six) hours as needed for muscle spasms. 40 tablet 0  . Multiple Vitamin (MULTIVITAMIN) tablet Take 1 tablet by mouth daily.      Marland Kitchen omeprazole (PRILOSEC) 20 MG capsule TAKE 1 CAPSULE BY MOUTH 2 TIMES DAILY BEFORE A MEAL. (Patient taking differently: Take 20 mg by mouth 2 (two) times daily before a meal. TAKE 1 CAPSULE BY MOUTH 2 TIMES DAILY BEFORE A MEAL.) 180 capsule 1  . spironolactone (ALDACTONE) 25 MG tablet Take 12.5 mg by mouth every morning.   2   No current facility-administered medications on file prior to visit.     Review of Systems:  As per HPI- otherwise negative.   Physical Examination: There were no vitals filed for this visit. There were no vitals filed for this visit. There is no height or weight on file to calculate BMI. Ideal Body Weight:    GEN: WDWN, NAD, Non-toxic, A & O x 3 HEENT: Atraumatic, Normocephalic. Neck supple. No masses, No LAD. Ears and Nose: No external deformity. CV: RRR, No M/G/R. No JVD. No thrill. No extra heart sounds. PULM: CTA B, no wheezes, crackles, rhonchi. No retractions. No resp. distress. No accessory muscle use. ABD: S, NT, ND, +BS. No rebound. No HSM. EXTR: No c/c/e NEURO Normal gait.  PSYCH: Normally interactive. Conversant. Not depressed or anxious appearing.  Calm demeanor.    Assessment and Plan: ***  Signed Lamar Blinks, MD

## 2018-01-04 ENCOUNTER — Ambulatory Visit: Payer: Medicare Other | Admitting: Family Medicine

## 2018-01-07 ENCOUNTER — Ambulatory Visit (INDEPENDENT_AMBULATORY_CARE_PROVIDER_SITE_OTHER): Payer: Medicare Other

## 2018-01-07 ENCOUNTER — Encounter (INDEPENDENT_AMBULATORY_CARE_PROVIDER_SITE_OTHER): Payer: Self-pay | Admitting: Orthopaedic Surgery

## 2018-01-07 ENCOUNTER — Ambulatory Visit (INDEPENDENT_AMBULATORY_CARE_PROVIDER_SITE_OTHER): Payer: Medicare Other | Admitting: Orthopaedic Surgery

## 2018-01-07 DIAGNOSIS — Z96641 Presence of right artificial hip joint: Secondary | ICD-10-CM | POA: Diagnosis not present

## 2018-01-07 DIAGNOSIS — Z96649 Presence of unspecified artificial hip joint: Secondary | ICD-10-CM

## 2018-01-07 NOTE — Progress Notes (Signed)
Patient is a very pleasant 84 year old female who is several weeks status post a right hip revision arthroplasty of a failed acetabular component.  She is been doing well until this past weekend she had a significant mechanical fall landing on the right hip hard and it actually dislocated.  She was then seen in the emergency room and she was able to have the hip reduced.  Postreduction films confirmed a concentric reduction.  She was placed in a knee immobilizer and she is coming back to see [place] today.  Again this happened last weekend.  She says she is doing well overall is not having any pain.  On exam arteriotomy immobilizer and put her hip to internal rotation and it is stable.  Her leg lengths are equal.  New x-rays of the pelvis and hip today shows of the hip implants in place and I see no complicating features or fracture.  I told her that having a dislocation could mean that this could happen again and if it does this would warrant revision surgery with lengthening her and potentially placing constrained liner.  She understands this and will be careful.  We will see her back in 4 weeks to see how she is doing overall but no x-rays are needed.  Again if it comes out again we would have to address that surgically and she understands this.

## 2018-01-08 ENCOUNTER — Other Ambulatory Visit (INDEPENDENT_AMBULATORY_CARE_PROVIDER_SITE_OTHER): Payer: Self-pay | Admitting: Orthopaedic Surgery

## 2018-01-08 ENCOUNTER — Encounter: Payer: Self-pay | Admitting: Family Medicine

## 2018-01-08 DIAGNOSIS — R300 Dysuria: Secondary | ICD-10-CM

## 2018-01-08 MED ORDER — CEPHALEXIN 500 MG PO CAPS: 500.0000 mg | ORAL_CAPSULE | Freq: Two times a day (BID) | ORAL | 0 refills | Status: DC

## 2018-01-08 NOTE — Telephone Encounter (Signed)
Please advise 

## 2018-01-08 NOTE — Telephone Encounter (Signed)
Called pt to discuss.  She has mild dysuria but no fever or belly pain.  It is hard for her to get out to bring in a urine sample right now rx for keflex sent to her pharmacy.  However she is cautioned to let me know if sx are not responding in the next couple of days as that may indicate a resistant bacteria.  In that case we will need to get a culture

## 2018-01-24 ENCOUNTER — Emergency Department (HOSPITAL_COMMUNITY): Payer: Medicare Other | Admitting: Certified Registered"

## 2018-01-24 ENCOUNTER — Inpatient Hospital Stay (HOSPITAL_COMMUNITY)
Admission: EM | Admit: 2018-01-24 | Discharge: 2018-02-02 | DRG: 466 | Disposition: A | Discharge status: Skilled Nursing Facility | Payer: Medicare Other | Attending: Orthopaedic Surgery | Admitting: Orthopaedic Surgery

## 2018-01-24 ENCOUNTER — Other Ambulatory Visit: Payer: Self-pay

## 2018-01-24 ENCOUNTER — Inpatient Hospital Stay (HOSPITAL_COMMUNITY): Payer: Medicare Other

## 2018-01-24 ENCOUNTER — Encounter (HOSPITAL_COMMUNITY): Payer: Self-pay | Admitting: *Deleted

## 2018-01-24 ENCOUNTER — Encounter (HOSPITAL_COMMUNITY)
Admission: EM | Disposition: A | Discharge status: Skilled Nursing Facility | Payer: Self-pay | Source: Home / Self Care | Attending: Orthopaedic Surgery

## 2018-01-24 ENCOUNTER — Emergency Department (HOSPITAL_COMMUNITY): Payer: Medicare Other

## 2018-01-24 DIAGNOSIS — Z9049 Acquired absence of other specified parts of digestive tract: Secondary | ICD-10-CM | POA: Diagnosis not present

## 2018-01-24 DIAGNOSIS — D509 Iron deficiency anemia, unspecified: Secondary | ICD-10-CM | POA: Diagnosis present

## 2018-01-24 DIAGNOSIS — N179 Acute kidney failure, unspecified: Secondary | ICD-10-CM | POA: Diagnosis not present

## 2018-01-24 DIAGNOSIS — Z90710 Acquired absence of both cervix and uterus: Secondary | ICD-10-CM

## 2018-01-24 DIAGNOSIS — E875 Hyperkalemia: Secondary | ICD-10-CM | POA: Diagnosis not present

## 2018-01-24 DIAGNOSIS — Z79899 Other long term (current) drug therapy: Secondary | ICD-10-CM | POA: Diagnosis not present

## 2018-01-24 DIAGNOSIS — I9581 Postprocedural hypotension: Secondary | ICD-10-CM | POA: Diagnosis not present

## 2018-01-24 DIAGNOSIS — D62 Acute posthemorrhagic anemia: Secondary | ICD-10-CM | POA: Diagnosis not present

## 2018-01-24 DIAGNOSIS — M810 Age-related osteoporosis without current pathological fracture: Secondary | ICD-10-CM | POA: Diagnosis present

## 2018-01-24 DIAGNOSIS — E8770 Fluid overload, unspecified: Secondary | ICD-10-CM | POA: Diagnosis not present

## 2018-01-24 DIAGNOSIS — W010XXA Fall on same level from slipping, tripping and stumbling without subsequent striking against object, initial encounter: Secondary | ICD-10-CM | POA: Diagnosis present

## 2018-01-24 DIAGNOSIS — K219 Gastro-esophageal reflux disease without esophagitis: Secondary | ICD-10-CM | POA: Diagnosis present

## 2018-01-24 DIAGNOSIS — Y92018 Other place in single-family (private) house as the place of occurrence of the external cause: Secondary | ICD-10-CM

## 2018-01-24 DIAGNOSIS — S72111A Displaced fracture of greater trochanter of right femur, initial encounter for closed fracture: Secondary | ICD-10-CM | POA: Diagnosis present

## 2018-01-24 DIAGNOSIS — E271 Primary adrenocortical insufficiency: Secondary | ICD-10-CM | POA: Diagnosis not present

## 2018-01-24 DIAGNOSIS — E039 Hypothyroidism, unspecified: Secondary | ICD-10-CM | POA: Diagnosis present

## 2018-01-24 DIAGNOSIS — T380X5A Adverse effect of glucocorticoids and synthetic analogues, initial encounter: Secondary | ICD-10-CM | POA: Diagnosis present

## 2018-01-24 DIAGNOSIS — E273 Drug-induced adrenocortical insufficiency: Secondary | ICD-10-CM | POA: Diagnosis present

## 2018-01-24 DIAGNOSIS — I10 Essential (primary) hypertension: Secondary | ICD-10-CM | POA: Diagnosis present

## 2018-01-24 DIAGNOSIS — Z681 Body mass index (BMI) 19 or less, adult: Secondary | ICD-10-CM

## 2018-01-24 DIAGNOSIS — M25351 Other instability, right hip: Secondary | ICD-10-CM | POA: Diagnosis not present

## 2018-01-24 DIAGNOSIS — Z7982 Long term (current) use of aspirin: Secondary | ICD-10-CM | POA: Diagnosis not present

## 2018-01-24 DIAGNOSIS — L930 Discoid lupus erythematosus: Secondary | ICD-10-CM | POA: Diagnosis present

## 2018-01-24 DIAGNOSIS — E2740 Unspecified adrenocortical insufficiency: Secondary | ICD-10-CM | POA: Diagnosis not present

## 2018-01-24 DIAGNOSIS — Z96649 Presence of unspecified artificial hip joint: Secondary | ICD-10-CM

## 2018-01-24 DIAGNOSIS — S73005A Unspecified dislocation of left hip, initial encounter: Secondary | ICD-10-CM

## 2018-01-24 DIAGNOSIS — Z902 Acquired absence of lung [part of]: Secondary | ICD-10-CM | POA: Diagnosis not present

## 2018-01-24 DIAGNOSIS — Y792 Prosthetic and other implants, materials and accessory orthopedic devices associated with adverse incidents: Secondary | ICD-10-CM | POA: Diagnosis present

## 2018-01-24 DIAGNOSIS — Z79890 Hormone replacement therapy (postmenopausal): Secondary | ICD-10-CM | POA: Diagnosis not present

## 2018-01-24 DIAGNOSIS — R571 Hypovolemic shock: Secondary | ICD-10-CM

## 2018-01-24 DIAGNOSIS — D696 Thrombocytopenia, unspecified: Secondary | ICD-10-CM | POA: Diagnosis not present

## 2018-01-24 DIAGNOSIS — R64 Cachexia: Secondary | ICD-10-CM | POA: Diagnosis present

## 2018-01-24 DIAGNOSIS — T84020A Dislocation of internal right hip prosthesis, initial encounter: Secondary | ICD-10-CM | POA: Diagnosis present

## 2018-01-24 DIAGNOSIS — Z96641 Presence of right artificial hip joint: Secondary | ICD-10-CM | POA: Diagnosis present

## 2018-01-24 DIAGNOSIS — Z85118 Personal history of other malignant neoplasm of bronchus and lung: Secondary | ICD-10-CM

## 2018-01-24 DIAGNOSIS — E871 Hypo-osmolality and hyponatremia: Secondary | ICD-10-CM | POA: Diagnosis present

## 2018-01-24 DIAGNOSIS — M069 Rheumatoid arthritis, unspecified: Secondary | ICD-10-CM | POA: Diagnosis present

## 2018-01-24 DIAGNOSIS — S73004A Unspecified dislocation of right hip, initial encounter: Secondary | ICD-10-CM

## 2018-01-24 DIAGNOSIS — C3411 Malignant neoplasm of upper lobe, right bronchus or lung: Secondary | ICD-10-CM

## 2018-01-24 DIAGNOSIS — S72114A Nondisplaced fracture of greater trochanter of right femur, initial encounter for closed fracture: Secondary | ICD-10-CM

## 2018-01-24 DIAGNOSIS — M1990 Unspecified osteoarthritis, unspecified site: Secondary | ICD-10-CM | POA: Diagnosis present

## 2018-01-24 DIAGNOSIS — Z419 Encounter for procedure for purposes other than remedying health state, unspecified: Secondary | ICD-10-CM

## 2018-01-24 DIAGNOSIS — M25551 Pain in right hip: Secondary | ICD-10-CM | POA: Diagnosis present

## 2018-01-24 DIAGNOSIS — K5900 Constipation, unspecified: Secondary | ICD-10-CM | POA: Diagnosis present

## 2018-01-24 DIAGNOSIS — M3500 Sicca syndrome, unspecified: Secondary | ICD-10-CM | POA: Diagnosis present

## 2018-01-24 HISTORY — PX: ANTERIOR HIP REVISION: SHX6527

## 2018-01-24 LAB — BASIC METABOLIC PANEL
Anion gap: 9 (ref 5–15)
BUN: 23 mg/dL (ref 8–23)
CALCIUM: 8.9 mg/dL (ref 8.9–10.3)
CO2: 23 mmol/L (ref 22–32)
Chloride: 100 mmol/L (ref 98–111)
Creatinine, Ser: 0.97 mg/dL (ref 0.44–1.00)
GFR calc non Af Amer: 53 mL/min — ABNORMAL LOW (ref >60)
Glucose, Bld: 97 mg/dL (ref 70–99)
Potassium: 5.3 mmol/L — ABNORMAL HIGH (ref 3.5–5.1)
Sodium: 132 mmol/L — ABNORMAL LOW (ref 135–145)

## 2018-01-24 LAB — CBC WITH DIFFERENTIAL/PLATELET
Abs Immature Granulocytes: 0.03 10*3/uL (ref 0.00–0.07)
BASOS ABS: 0 10*3/uL (ref 0.0–0.1)
BASOS PCT: 1 %
EOS ABS: 0 10*3/uL (ref 0.0–0.5)
EOS PCT: 1 %
HEMATOCRIT: 28.7 % — AB (ref 36.0–46.0)
Hemoglobin: 8.9 g/dL — ABNORMAL LOW (ref 12.0–15.0)
Immature Granulocytes: 1 %
LYMPHS ABS: 0.7 10*3/uL (ref 0.7–4.0)
Lymphocytes Relative: 11 %
MCH: 28.9 pg (ref 26.0–34.0)
MCHC: 31 g/dL (ref 30.0–36.0)
MCV: 93.2 fL (ref 80.0–100.0)
Monocytes Absolute: 0.5 10*3/uL (ref 0.1–1.0)
Monocytes Relative: 8 %
NEUTROS PCT: 78 %
NRBC: 0 % (ref 0.0–0.2)
Neutro Abs: 5.2 10*3/uL (ref 1.7–7.7)
Platelets: 189 10*3/uL (ref 150–400)
RBC: 3.08 MIL/uL — ABNORMAL LOW (ref 3.87–5.11)
RDW: 19.2 % — ABNORMAL HIGH (ref 11.5–15.5)
WBC: 6.6 10*3/uL (ref 4.0–10.5)

## 2018-01-24 LAB — PREPARE RBC (CROSSMATCH)

## 2018-01-24 SURGERY — REVISION, TOTAL ARTHROPLASTY, HIP, ANTERIOR APPROACH
Anesthesia: General | Site: Hip | Laterality: Right

## 2018-01-24 MED ORDER — SODIUM CHLORIDE 0.9 % IV SOLN
INTRAVENOUS | Status: AC | PRN
  Administered 2018-01-24: 1000 mL via INTRAVENOUS

## 2018-01-24 MED ORDER — ONDANSETRON HCL 4 MG/2ML IJ SOLN: 4.0000 mg | Freq: Four times a day (QID) | INTRAMUSCULAR | Status: DC | PRN

## 2018-01-24 MED ORDER — KETAMINE HCL 10 MG/ML IJ SOLN
INTRAMUSCULAR | Status: AC | PRN
  Administered 2018-01-24: 25 mg via INTRAVENOUS
  Administered 2018-01-24 (×2): 5 mg via INTRAVENOUS
  Administered 2018-01-24: 10 mg via INTRAVENOUS

## 2018-01-24 MED ORDER — EPHEDRINE 5 MG/ML INJ
INTRAVENOUS | Status: AC
  Filled 2018-01-24: qty 10

## 2018-01-24 MED ORDER — DEXAMETHASONE SODIUM PHOSPHATE 10 MG/ML IJ SOLN
INTRAMUSCULAR | Status: DC | PRN
  Administered 2018-01-24: 8 mg via INTRAVENOUS

## 2018-01-24 MED ORDER — ONDANSETRON HCL 4 MG PO TABS: 4.0000 mg | ORAL_TABLET | Freq: Four times a day (QID) | ORAL | Status: DC | PRN

## 2018-01-24 MED ORDER — ONDANSETRON HCL 4 MG/2ML IJ SOLN
INTRAMUSCULAR | Status: DC | PRN
  Administered 2018-01-24: 4 mg via INTRAVENOUS

## 2018-01-24 MED ORDER — DIPHENHYDRAMINE HCL 12.5 MG/5ML PO ELIX: 12.5000 mg | ORAL_SOLUTION | ORAL | Status: DC | PRN

## 2018-01-24 MED ORDER — METHOCARBAMOL 1000 MG/10ML IJ SOLN
500.0000 mg | Freq: Four times a day (QID) | INTRAVENOUS | Status: DC | PRN
  Filled 2018-01-24: qty 5

## 2018-01-24 MED ORDER — PROPOFOL 10 MG/ML IV BOLUS
INTRAVENOUS | Status: AC | PRN
  Administered 2018-01-24: 25 mg via INTRAVENOUS
  Administered 2018-01-24: 10 mg via INTRAVENOUS
  Administered 2018-01-24 (×2): 5 mg via INTRAVENOUS

## 2018-01-24 MED ORDER — DEXAMETHASONE SODIUM PHOSPHATE 10 MG/ML IJ SOLN
INTRAMUSCULAR | Status: AC
  Filled 2018-01-24: qty 1

## 2018-01-24 MED ORDER — ROCURONIUM BROMIDE 10 MG/ML (PF) SYRINGE
PREFILLED_SYRINGE | INTRAVENOUS | Status: DC | PRN
  Administered 2018-01-24: 20 mg via INTRAVENOUS
  Administered 2018-01-24: 50 mg via INTRAVENOUS
  Administered 2018-01-24: 20 mg via INTRAVENOUS

## 2018-01-24 MED ORDER — POLYETHYLENE GLYCOL 3350 17 G PO PACK
17.0000 g | PACK | Freq: Every day | ORAL | Status: DC | PRN
  Administered 2018-02-02: 17 g via ORAL
  Filled 2018-01-24: qty 1

## 2018-01-24 MED ORDER — PHENYLEPHRINE 40 MCG/ML (10ML) SYRINGE FOR IV PUSH (FOR BLOOD PRESSURE SUPPORT)
PREFILLED_SYRINGE | INTRAVENOUS | Status: AC
  Filled 2018-01-24: qty 10

## 2018-01-24 MED ORDER — EPHEDRINE SULFATE-NACL 50-0.9 MG/10ML-% IV SOSY
PREFILLED_SYRINGE | INTRAVENOUS | Status: DC | PRN
  Administered 2018-01-24 (×2): 5 mg via INTRAVENOUS
  Administered 2018-01-24: 10 mg via INTRAVENOUS

## 2018-01-24 MED ORDER — MORPHINE SULFATE (PF) 4 MG/ML IV SOLN: 1.0000 mg | INTRAVENOUS | Status: DC | PRN

## 2018-01-24 MED ORDER — FENTANYL CITRATE (PF) 100 MCG/2ML IJ SOLN
INTRAMUSCULAR | Status: AC
  Filled 2018-01-24: qty 2

## 2018-01-24 MED ORDER — SODIUM CHLORIDE 0.9 % IV SOLN
INTRAVENOUS | Status: DC | PRN
  Administered 2018-01-24: 25 ug/min via INTRAVENOUS

## 2018-01-24 MED ORDER — KETAMINE HCL 50 MG/5ML IJ SOSY
1.0000 mg/kg | PREFILLED_SYRINGE | Freq: Once | INTRAMUSCULAR | Status: DC
  Filled 2018-01-24: qty 5

## 2018-01-24 MED ORDER — CEFAZOLIN SODIUM-DEXTROSE 2-4 GM/100ML-% IV SOLN
2.0000 g | INTRAVENOUS | Status: AC
  Administered 2018-01-24: 2 g via INTRAVENOUS

## 2018-01-24 MED ORDER — PROMETHAZINE HCL 25 MG/ML IJ SOLN: 6.2500 mg | INTRAMUSCULAR | Status: DC | PRN

## 2018-01-24 MED ORDER — METOCLOPRAMIDE HCL 5 MG/ML IJ SOLN: 5.0000 mg | Freq: Three times a day (TID) | INTRAMUSCULAR | Status: DC | PRN

## 2018-01-24 MED ORDER — SODIUM CHLORIDE 0.9 % IV SOLN
INTRAVENOUS | Status: DC | PRN
  Administered 2018-01-24: 17:00:00 via INTRAVENOUS

## 2018-01-24 MED ORDER — MENTHOL 3 MG MT LOZG
1.0000 | LOZENGE | OROMUCOSAL | Status: DC | PRN
  Filled 2018-01-24: qty 9

## 2018-01-24 MED ORDER — HYDROCODONE-ACETAMINOPHEN 5-325 MG PO TABS
1.0000 | ORAL_TABLET | ORAL | Status: DC | PRN
  Administered 2018-01-25 – 2018-01-27 (×3): 2 via ORAL
  Administered 2018-01-28 – 2018-02-02 (×6): 1 via ORAL
  Filled 2018-01-24 (×5): qty 1
  Filled 2018-01-24 (×2): qty 2
  Filled 2018-01-24: qty 1
  Filled 2018-01-24: qty 2

## 2018-01-24 MED ORDER — CEFAZOLIN SODIUM-DEXTROSE 1-4 GM/50ML-% IV SOLN
1.0000 g | Freq: Four times a day (QID) | INTRAVENOUS | Status: AC
  Administered 2018-01-25 (×2): 1 g via INTRAVENOUS
  Filled 2018-01-24 (×2): qty 50

## 2018-01-24 MED ORDER — LEVOTHYROXINE SODIUM 88 MCG PO TABS
88.0000 ug | ORAL_TABLET | Freq: Every day | ORAL | Status: DC
  Administered 2018-01-25 – 2018-02-01 (×9): 88 ug via ORAL
  Filled 2018-01-24 (×9): qty 1

## 2018-01-24 MED ORDER — METHOCARBAMOL 500 MG PO TABS
500.0000 mg | ORAL_TABLET | Freq: Four times a day (QID) | ORAL | Status: DC | PRN
  Administered 2018-02-02: 500 mg via ORAL
  Filled 2018-01-24: qty 1

## 2018-01-24 MED ORDER — SPIRONOLACTONE 12.5 MG HALF TABLET
12.5000 mg | ORAL_TABLET | Freq: Every morning | ORAL | Status: DC
  Filled 2018-01-24: qty 1

## 2018-01-24 MED ORDER — PROPOFOL 10 MG/ML IV BOLUS
INTRAVENOUS | Status: AC | PRN
  Administered 2018-01-24: 10 mg via INTRAVENOUS
  Administered 2018-01-24: 15 mg via INTRAVENOUS

## 2018-01-24 MED ORDER — LACTATED RINGERS IV BOLUS
1000.0000 mL | Freq: Once | INTRAVENOUS | Status: AC
  Administered 2018-01-24: 1000 mL via INTRAVENOUS

## 2018-01-24 MED ORDER — HYDROCODONE-ACETAMINOPHEN 7.5-325 MG PO TABS
1.0000 | ORAL_TABLET | ORAL | Status: DC | PRN
  Administered 2018-01-31: 1 via ORAL
  Administered 2018-02-01: 2 via ORAL
  Administered 2018-02-01 (×2): 1 via ORAL
  Filled 2018-01-24 (×2): qty 1
  Filled 2018-01-24: qty 2
  Filled 2018-01-24: qty 1

## 2018-01-24 MED ORDER — METOCLOPRAMIDE HCL 5 MG PO TABS: 5.0000 mg | ORAL_TABLET | Freq: Three times a day (TID) | ORAL | Status: DC | PRN

## 2018-01-24 MED ORDER — ACETAMINOPHEN 325 MG PO TABS
325.0000 mg | ORAL_TABLET | Freq: Four times a day (QID) | ORAL | Status: DC | PRN
  Administered 2018-01-25 – 2018-02-02 (×3): 650 mg via ORAL
  Filled 2018-01-24 (×3): qty 2

## 2018-01-24 MED ORDER — PHENYLEPHRINE HCL 10 MG/ML IJ SOLN
INTRAMUSCULAR | Status: AC
  Filled 2018-01-24: qty 2

## 2018-01-24 MED ORDER — LIDOCAINE 2% (20 MG/ML) 5 ML SYRINGE
INTRAMUSCULAR | Status: DC | PRN
  Administered 2018-01-24: 60 mg via INTRAVENOUS

## 2018-01-24 MED ORDER — ALBUMIN HUMAN 5 % IV SOLN
INTRAVENOUS | Status: AC
  Administered 2018-01-24: 12.5 g
  Filled 2018-01-24: qty 250

## 2018-01-24 MED ORDER — BIOTIN 1000 MCG PO TABS: 1000.0000 ug | ORAL_TABLET | Freq: Every day | ORAL | Status: DC

## 2018-01-24 MED ORDER — PHENYLEPHRINE HCL-NACL 10-0.9 MG/250ML-% IV SOLN
INTRAVENOUS | Status: AC
  Filled 2018-01-24: qty 250

## 2018-01-24 MED ORDER — KETAMINE HCL 10 MG/ML IJ SOLN
INTRAMUSCULAR | Status: AC | PRN
  Administered 2018-01-24: 10 mg via INTRAVENOUS
  Administered 2018-01-24: 15 mg via INTRAVENOUS

## 2018-01-24 MED ORDER — PROMETHAZINE HCL 25 MG/ML IJ SOLN
INTRAMUSCULAR | Status: AC
  Filled 2018-01-24: qty 1

## 2018-01-24 MED ORDER — PHENOL 1.4 % MT LIQD
1.0000 | OROMUCOSAL | Status: DC | PRN
  Filled 2018-01-24: qty 177

## 2018-01-24 MED ORDER — FENTANYL CITRATE (PF) 250 MCG/5ML IJ SOLN
INTRAMUSCULAR | Status: DC | PRN
  Administered 2018-01-24: 50 ug via INTRAVENOUS

## 2018-01-24 MED ORDER — KETAMINE HCL 50 MG/5ML IJ SOSY
10.0000 mg | PREFILLED_SYRINGE | Freq: Once | INTRAMUSCULAR | Status: DC
  Filled 2018-01-24: qty 5

## 2018-01-24 MED ORDER — SUGAMMADEX SODIUM 200 MG/2ML IV SOLN
INTRAVENOUS | Status: DC | PRN
  Administered 2018-01-24: 100 mg via INTRAVENOUS

## 2018-01-24 MED ORDER — POVIDONE-IODINE 10 % EX SWAB: 2.0000 "application " | Freq: Once | CUTANEOUS | Status: DC

## 2018-01-24 MED ORDER — STERILE WATER FOR IRRIGATION IR SOLN
Status: DC | PRN
  Administered 2018-01-24: 2000 mL

## 2018-01-24 MED ORDER — EPINEPHRINE PF 1 MG/10ML IJ SOSY
PREFILLED_SYRINGE | INTRAMUSCULAR | Status: AC
  Filled 2018-01-24: qty 10

## 2018-01-24 MED ORDER — PHENYLEPHRINE 40 MCG/ML (10ML) SYRINGE FOR IV PUSH (FOR BLOOD PRESSURE SUPPORT)
PREFILLED_SYRINGE | INTRAVENOUS | Status: DC | PRN
  Administered 2018-01-24: 120 ug via INTRAVENOUS
  Administered 2018-01-24: 80 ug via INTRAVENOUS
  Administered 2018-01-24: 120 ug via INTRAVENOUS
  Administered 2018-01-24: 80 ug via INTRAVENOUS
  Administered 2018-01-24 (×2): 120 ug via INTRAVENOUS

## 2018-01-24 MED ORDER — PANTOPRAZOLE SODIUM 40 MG PO TBEC
40.0000 mg | DELAYED_RELEASE_TABLET | Freq: Every day | ORAL | Status: DC
  Administered 2018-01-25 – 2018-02-02 (×9): 40 mg via ORAL
  Filled 2018-01-24 (×9): qty 1

## 2018-01-24 MED ORDER — ASPIRIN 81 MG PO CHEW
81.0000 mg | CHEWABLE_TABLET | Freq: Two times a day (BID) | ORAL | Status: DC
  Administered 2018-01-25 – 2018-02-02 (×18): 81 mg via ORAL
  Filled 2018-01-24 (×18): qty 1

## 2018-01-24 MED ORDER — ADULT MULTIVITAMIN W/MINERALS CH
1.0000 | ORAL_TABLET | Freq: Every day | ORAL | Status: DC
  Administered 2018-01-25 – 2018-02-02 (×9): 1 via ORAL
  Filled 2018-01-24 (×9): qty 1

## 2018-01-24 MED ORDER — MORPHINE SULFATE (PF) 2 MG/ML IV SOLN: 0.5000 mg | INTRAVENOUS | Status: DC | PRN

## 2018-01-24 MED ORDER — CALCIUM CARBONATE-VITAMIN D 500-200 MG-UNIT PO TABS
1.0000 | ORAL_TABLET | Freq: Every day | ORAL | Status: DC
  Administered 2018-01-25 – 2018-02-02 (×9): 1 via ORAL
  Filled 2018-01-24 (×9): qty 1

## 2018-01-24 MED ORDER — GABAPENTIN 300 MG PO CAPS
600.0000 mg | ORAL_CAPSULE | Freq: Every day | ORAL | Status: DC
  Administered 2018-01-25 – 2018-02-01 (×9): 600 mg via ORAL
  Filled 2018-01-24 (×9): qty 2

## 2018-01-24 MED ORDER — PROPOFOL 10 MG/ML IV BOLUS
INTRAVENOUS | Status: AC
  Filled 2018-01-24: qty 20

## 2018-01-24 MED ORDER — SODIUM CHLORIDE 0.9 % IV SOLN
INTRAVENOUS | Status: DC
  Administered 2018-01-24: 75 mL/h via INTRAVENOUS
  Administered 2018-01-25: 01:00:00 via INTRAVENOUS

## 2018-01-24 MED ORDER — ALBUMIN HUMAN 25 % IV SOLN
12.5000 g | Freq: Once | INTRAVENOUS | Status: DC
  Filled 2018-01-24: qty 50

## 2018-01-24 MED ORDER — CHLORHEXIDINE GLUCONATE 4 % EX LIQD: 60.0000 mL | Freq: Once | CUTANEOUS | Status: DC

## 2018-01-24 MED ORDER — DOCUSATE SODIUM 100 MG PO CAPS
100.0000 mg | ORAL_CAPSULE | Freq: Two times a day (BID) | ORAL | Status: DC
  Administered 2018-01-25 – 2018-02-02 (×18): 100 mg via ORAL
  Filled 2018-01-24 (×18): qty 1

## 2018-01-24 MED ORDER — ALUM & MAG HYDROXIDE-SIMETH 200-200-20 MG/5ML PO SUSP: 30.0000 mL | ORAL | Status: DC | PRN

## 2018-01-24 MED ORDER — LACTATED RINGERS IV SOLN
INTRAVENOUS | Status: DC | PRN
  Administered 2018-01-24: 16:00:00 via INTRAVENOUS

## 2018-01-24 MED ORDER — PROPOFOL 10 MG/ML IV BOLUS
10.0000 mg | Freq: Once | INTRAVENOUS | Status: DC
  Filled 2018-01-24: qty 20

## 2018-01-24 MED ORDER — ONDANSETRON HCL 4 MG/2ML IJ SOLN
INTRAMUSCULAR | Status: AC
  Filled 2018-01-24: qty 2

## 2018-01-24 MED ORDER — PHENYLEPHRINE HCL-NACL 10-0.9 MG/250ML-% IV SOLN
25.0000 ug/min | INTRAVENOUS | Status: DC
  Administered 2018-01-24: 20 ug/min via INTRAVENOUS
  Administered 2018-01-24: 60 ug/min via INTRAVENOUS
  Administered 2018-01-25: 25 ug/min via INTRAVENOUS
  Administered 2018-01-25: 40 ug/min via INTRAVENOUS
  Administered 2018-01-25: 25 ug/min via INTRAVENOUS
  Filled 2018-01-24 (×5): qty 250

## 2018-01-24 MED ORDER — SODIUM CHLORIDE 0.9 % IV SOLN
30.0000 ug/min | INTRAVENOUS | Status: DC
  Filled 2018-01-24 (×3): qty 2

## 2018-01-24 MED ORDER — GABAPENTIN 300 MG PO CAPS
300.0000 mg | ORAL_CAPSULE | Freq: Two times a day (BID) | ORAL | Status: DC
  Administered 2018-01-25 – 2018-02-02 (×17): 300 mg via ORAL
  Filled 2018-01-24 (×17): qty 1

## 2018-01-24 MED ORDER — PROPOFOL 10 MG/ML IV BOLUS
INTRAVENOUS | Status: DC | PRN
  Administered 2018-01-24: 80 mg via INTRAVENOUS

## 2018-01-24 MED ORDER — CEFAZOLIN SODIUM-DEXTROSE 2-4 GM/100ML-% IV SOLN
INTRAVENOUS | Status: AC
  Filled 2018-01-24: qty 100

## 2018-01-24 MED ORDER — SODIUM CHLORIDE 0.9 % IR SOLN
Status: DC | PRN
  Administered 2018-01-24: 1000 mL

## 2018-01-24 SURGICAL SUPPLY — 42 items
APL SKNCLS STERI-STRIP NONHPOA (GAUZE/BANDAGES/DRESSINGS) ×1
BAG SPEC THK2 15X12 ZIP CLS (MISCELLANEOUS) ×1
BAG ZIPLOCK 12X15 (MISCELLANEOUS) ×1 IMPLANT
BALL HIP ARTICU EZE 36 8.5 (Hips) IMPLANT
BENZOIN TINCTURE PRP APPL 2/3 (GAUZE/BANDAGES/DRESSINGS) ×2 IMPLANT
BLADE SAW SGTL 18X1.27X75 (BLADE) ×2 IMPLANT
CELLS DAT CNTRL 66122 CELL SVR (MISCELLANEOUS) ×1 IMPLANT
CLOTH BEACON ORANGE TIMEOUT ST (SAFETY) ×2 IMPLANT
COVER PERINEAL POST (MISCELLANEOUS) ×2 IMPLANT
COVER SURGICAL LIGHT HANDLE (MISCELLANEOUS) ×2 IMPLANT
COVER WAND RF STERILE (DRAPES) IMPLANT
DRAPE STERI IOBAN 125X83 (DRAPES) ×2 IMPLANT
DRAPE U-SHAPE 47X51 STRL (DRAPES) ×4 IMPLANT
DRSG AQUACEL AG ADV 3.5X10 (GAUZE/BANDAGES/DRESSINGS) ×2 IMPLANT
DURAPREP 26ML APPLICATOR (WOUND CARE) ×2 IMPLANT
ELECT REM PT RETURN 15FT ADLT (MISCELLANEOUS) ×2 IMPLANT
GAUZE XEROFORM 1X8 LF (GAUZE/BANDAGES/DRESSINGS) ×2 IMPLANT
GLOVE BIO SURGEON STRL SZ7.5 (GLOVE) ×4 IMPLANT
GLOVE ECLIPSE 8.0 STRL XLNG CF (GLOVE) ×2 IMPLANT
GLOVE INDICATOR 8.0 STRL GRN (GLOVE) ×4 IMPLANT
GOWN STRL REUS W/TWL LRG LVL3 (GOWN DISPOSABLE) ×4 IMPLANT
HANDPIECE INTERPULSE COAX TIP (DISPOSABLE) ×2
HIP BALL ARTICU EZE 36 8.5 (Hips) ×2 IMPLANT
HOLDER FOLEY CATH W/STRAP (MISCELLANEOUS) ×2 IMPLANT
IMMOBILIZER KNEE 16 UNIV (MISCELLANEOUS) ×1 IMPLANT
LINER NEUTRAL 52X36MM PLUS 4 (Liner) ×1 IMPLANT
PACK ANTERIOR HIP CUSTOM (KITS) ×2 IMPLANT
RETRACTOR WND ALEXIS 18 MED (MISCELLANEOUS) ×1 IMPLANT
RTRCTR WOUND ALEXIS 18CM MED (MISCELLANEOUS) ×2
SET HNDPC FAN SPRY TIP SCT (DISPOSABLE) ×1 IMPLANT
STRIP CLOSURE SKIN 1/2X4 (GAUZE/BANDAGES/DRESSINGS) ×2 IMPLANT
SUT ETHIBOND NAB CT1 #1 30IN (SUTURE) ×1 IMPLANT
SUT FIBERWIRE #2 38 T-5 BLUE (SUTURE) ×2
SUT MNCRL AB 4-0 PS2 18 (SUTURE) ×2 IMPLANT
SUT VIC AB 0 CT1 36 (SUTURE) ×2 IMPLANT
SUT VIC AB 1 CT1 36 (SUTURE) ×6 IMPLANT
SUT VIC AB 2-0 CT1 27 (SUTURE) ×4
SUT VIC AB 2-0 CT1 TAPERPNT 27 (SUTURE) ×2 IMPLANT
SUTURE FIBERWR #2 38 T-5 BLUE (SUTURE) IMPLANT
TRAY FOLEY MTR SLVR 16FR STAT (SET/KITS/TRAYS/PACK) ×2 IMPLANT
WATER STERILE IRR 1000ML POUR (IV SOLUTION) ×2 IMPLANT
YANKAUER SUCT BULB TIP NO VENT (SUCTIONS) ×2 IMPLANT

## 2018-01-24 NOTE — ED Notes (Signed)
Respiratory made aware of conscious sedation.

## 2018-01-24 NOTE — Progress Notes (Signed)
Unit of blood stopped at 2020.

## 2018-01-24 NOTE — ED Notes (Signed)
MD, RT, and primary RN at bedside.

## 2018-01-24 NOTE — ED Provider Notes (Signed)
[place] DEPT Provider Note   CSN: [id number] Arrival date & time: 01/24/18  2703     History   Chief Complaint Chief Complaint  Patient presents with  . Fall  . Hip Pain    HPI [name] is a 84 y.o. female.  84 year old female here after mechanical fall complaining of right hip pain.  History of right hip replacement in the past and was recently in the department where she had a right hip dislocation that was relocated.  Denies any lower back pain.  No distal numbness or tingling to her right foot.  Pain is sharp and worse with any movement and better with remaining still.  EMS called and patient transported here.     Past Medical History:  Diagnosis Date  . Arthralgia of multiple joints    followed by dr Gerilyn Nestle  . Arthritis   . Chronic constipation   . Chronic inflammatory arthritis    rhemotolgist-  dr a. Gerilyn Nestle [facility])  . Dry eyes    eye drops used   . GERD (gastroesophageal reflux disease)   . H/O discoid lupus erythematosus   . Hiatal hernia   . History of colon polyps   . Hypothyroidism   . Iron deficiency anemia   . LBBB (left bundle branch block) 2010  . Mitchell's disease (erythromelalgia) [facility])    neurologist-  dr patel  . Nocturia   . Non-small cell cancer of right lung [facility]) surgeon-- dr gerhardt/  oncologist-  dr Julien Nordmann--- per lov notes no recurrence/   11-18-2017 per pt denies any symptoms   dx 2015--  Stage IIA (T2b,N0,M0) , +EGFR  mutation in exon 21, non-small cell adenocarcinoma right upper lobe---  s/p  Right upper lobectomy , right middley wedge resection and node dissection---  no chemo or radiation therapy  . OA (osteoarthritis)    hands  . Osteoporosis   . PONV (postoperative nausea and vomiting)    likes phenergan  . Raynaud's phenomenon 1965  . Renal insufficiency   . Rheumatoid arthritis ([place])   . Sciatica   . Scoliosis   . Sjogren's syndrome ([place])   . Urgency of urination       Patient Active Problem List   Diagnosis Date Noted  . Failed total hip arthroplasty ([place]) 11/27/2017  . Status post revision of total hip 11/27/2017  . Elevated cholesterol 10/11/2015  . 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[place]) 10/04/2014  . Bilateral leg edema 08/01/2014  . Elevated BP 08/01/2014  . Dehydration 08/01/2014  . Rheumatoid arthritis involving multiple joints ([place]) 05/30/2014  . Osteoarthritis of right hip 04/28/2014  . Status post total replacement of right hip 04/28/2014  . Long-term use of high-risk medication 11/11/2013  . Routine general medical examination at a health care facility 11/11/2013  . Anemia, unspecified 11/11/2013  . Neuropathic pain 07/07/2013  . Constipation due to pain medication 07/07/2013  . Protein-calorie malnutrition, severe ([place]) 06/08/2013  . Lung cancer, Right upper lobe 05/08/2013  . Anterior chest wall pain 09/18/2011  . Breast lump 09/18/2011  . Sciatica of right side 08/13/2011  . Sinusitis 06/13/2011  . Cerumen impaction 08/20/2010  . SINUSITIS - ACUTE-NOS 04/30/2010  . Osteopenia 03/07/2010  . PARESTHESIA 03/07/2010  . CONTUSION OF LOWER LEG 11/29/2009  . DIZZINESS 10/01/2009  . Backache 03/16/2009  . CT, CHEST, ABNORMAL 12/18/2008  . ABNORMAL ECHOCARDIOGRAM 12/14/2008  . URI 11/29/2008  . [place] SYNDROME 11/29/2008  . CONTACT DERMATITIS 11/02/2006  . HYPOGLYCEMIA 06/29/2006  .  RAYNAUD'S DISEASE 06/29/2006    Past Surgical History:  Procedure Laterality Date  . ANTERIOR HIP REVISION Right 11/27/2017   Procedure: RIGHT HIP ACETABULAR REVISION;  Surgeon: [name], MD;  Location: [facility];  Service: Orthopedics;  Laterality: Right;  . APPENDECTOMY  1950s  . CARDIOVASCULAR STRESS TEST  12/2008    mild fixed basal to mid septal perfusion defect felt likely due to artifact from LBBB, no ischemia, EF 58%  . COLONOSCOPY    . LYMPH NODE DISSECTION Right 06/07/2013   Procedure: LYMPH NODE DISSECTION;  Surgeon: [name], MD;  Location: [place];  Service: Thoracic;  Laterality: Right;  . [place]   "large incision from chest to up to shoulder, the nerves were tied together, for raynaud's  . THORACOTOMY  06/07/2013   Procedure: MINI/LIMITED THORACOTOMY; right middle lobe wedge resection;  Surgeon: [name], MD;  Location: [place];  Service: Thoracic;;  . TONSILLECTOMY  child  . TOTAL ABDOMINAL HYSTERECTOMY  1980's    W/ BSO  . TOTAL HIP ARTHROPLASTY Right 04/28/2014   Procedure: RIGHT TOTAL HIP ARTHROPLASTY ANTERIOR APPROACH;  Surgeon: [name], MD;  Location: [facility];  Service: Orthopedics;  Laterality: Right;  . TRANSTHORACIC ECHOCARDIOGRAM  12/11/2008   ef 89-21%, grade 1 diastolic dysfunction/  mild LAE/  mild AR and MR/  trivial TR  . VIDEO ASSISTED THORACOSCOPY (VATS)/WEDGE RESECTION Right 06/07/2013   Procedure: VIDEO ASSISTED THORACOSCOPY (VATS)/right upper lobectomy, On Q;  Surgeon: [name], MD;  Location: [place];  Service: Thoracic;  Laterality: Right;  [place] VIDEO BRONCHOSCOPY N/A 06/07/2013   Procedure: VIDEO BRONCHOSCOPY;  Surgeon: [name], MD;  Location: [facility] OR;  Service: Thoracic;  Laterality: N/A;  . VIDEO BRONCHOSCOPY WITH ENDOBRONCHIAL NAVIGATION N/A 05/04/2013   Procedure: VIDEO BRONCHOSCOPY WITH ENDOBRONCHIAL NAVIGATION;  Surgeon: [name], MD;  Location: [facility] OR;  Service: Thoracic;  Laterality: N/A;     OB History    Gravida  4   Para  2   Term      Preterm      AB  2   Living  2     SAB  2   TAB      Ectopic      Multiple      Live Births               Home Medications    Prior to Admission medications   Medication Sig Start Date End Date Taking? Authorizing Provider  aspirin 81 MG chewable tablet Chew 1 tablet (81 mg total) by mouth daily. 11/30/17   [name], MD  Biotin 1000 MCG tablet Take 1,000 mcg by mouth daily.     [provider]  Calcium Carbonate-Vitamin D  (CALCIUM 500 + D) 500-125 MG-UNIT TABS Take 1 tablet by mouth daily.     [provider]  cephALEXin (KEFLEX) 500 MG capsule Take 1 capsule (500 mg total) by mouth 2 (two) times daily. 01/08/18   Copland, Gay Filler, MD  docusate sodium (STOOL SOFTENER) 100 MG capsule Take 100 mg by mouth 2 (two) times daily.      [provider]  estradiol (ESTRACE VAGINAL) 0.1 MG/GM vaginal cream USE AS DIRECTED Patient taking differently: Place 1 Applicatorful vaginally every 6 (six) months. USE AS DIRECTED 07/02/16   Midge Minium, MD  gabapentin (NEURONTIN) 300 MG capsule TAKE 300MG IN THE MORNING, 300MG IN THE AFTERNOON, AND 600MG AT  BEDTIME. Patient taking differently: Take 300-600 mg by mouth See admin instructions. Take 341m in the morning, 301min the afternoon, and 60015mt bedtime. 12/10/16   Patel, DonArvin Collard DO  Glucosamine-Chondroit-Vit C-Mn (GLUCOSAMINE CHONDR 1500 COMPLX PO) Take 1 capsule by mouth daily.     [provider]  HYDROcodone-acetaminophen (NORCO/VICODIN) 5-325 MG tablet Take 1 tablet by mouth every 4 (four) hours as needed for severe pain. 11/30/17   BlaMcarthur RossettiD  levothyroxine (SYNTHROID, LEVOTHROID) 88 MCG tablet TAKE 1 TABLET (88 MCG TOTAL) BY MOUTH DAILY BEFORE BREAKFAST. Patient taking differently: Take 88 mcg by mouth at bedtime. Per pt takes at 10pm 11/03/17   AndLeamon ArntD  meloxicam (MOBIC) 7.5 MG tablet Take 7.5 mg by mouth 2 (two) times daily as needed for pain.    [provider]  methocarbamol (ROBAXIN) 500 MG tablet TAKE 1 TABLET (500 MG TOTAL) BY MOUTH EVERY 6 (SIX) HOURS AS NEEDED FOR MUSCLE SPASMS. 01/08/18   BlaMcarthur RossettiD  Multiple Vitamin (MULTIVITAMIN) tablet Take 1 tablet by mouth daily.      [provider]  omeprazole (PRILOSEC) 20 MG capsule TAKE 1 CAPSULE BY MOUTH 2 TIMES DAILY BEFORE A MEAL. Patient taking differently: Take 20 mg by mouth 2 (two) times daily before a meal. TAKE 1  CAPSULE BY MOUTH 2 TIMES DAILY BEFORE A MEAL. 11/03/17   AndLeamon ArntD  spironolactone (ALDACTONE) 25 MG tablet Take 12.5 mg by mouth every morning.  07/12/14   [provider]    Family History Family History  Problem Relation Age of Onset  . Coronary artery disease Father   . Colon cancer Father   . Diabetes Father   . Cancer Father        colon  . Other Mother 94 55    MVA  . Healthy Sister   . Other Brother        Killed on war  . Healthy Daughter   . Esophageal cancer Neg Hx   . Kidney disease Neg Hx   . Liver disease Neg Hx     Social History Social History   Tobacco Use  . Smoking status: Never Smoker  . Smokeless tobacco: Never Used  Substance Use Topics  . Alcohol use: Not Currently    Frequency: Never  . Drug use: Never     Allergies   Amlodipine; Prochlorperazine edisylate; Aspirin; Cymbalta [duloxetine hcl]; and Pamelor [nortriptyline hcl]   Review of Systems Review of Systems  All other systems reviewed and are negative.    Physical Exam Updated Vital Signs BP (!) 148/76 (BP Location: Left Arm)   Pulse 69   Temp 97.6 F (36.4 C) (Oral)   Resp 18   Ht 1.575 m (_0 )   Wt 47.6 kg   SpO2 99%   BMI 19.20 kg/m   Physical Exam  Constitutional: She is oriented to person, place, and time. She appears well-developed and well-nourished.  Non-toxic appearance. No distress.  HENT:  Head: Normocephalic and atraumatic.  Eyes: Pupils are equal, round, and reactive to light. Conjunctivae, EOM and lids are normal.  Neck: Normal range of motion. Neck supple. No tracheal deviation present. No thyroid mass present.  Cardiovascular: Normal rate, regular rhythm and normal heart sounds. Exam reveals no gallop.  No murmur heard. Pulmonary/Chest: Effort normal and breath sounds normal. No stridor. No respiratory distress. She has no decreased breath sounds. She has no wheezes. She has no  rhonchi. She has no rales.  Abdominal: Soft. Normal  appearance and bowel sounds are normal. She exhibits no distension. There is no tenderness. There is no rebound and no CVA tenderness.  Musculoskeletal: Normal range of motion. She exhibits no edema or tenderness.  Right hip shortened and rotated.  Neurovascular intact at the right foot  Neurological: She is alert and oriented to person, place, and time. She has normal strength. No cranial nerve deficit or sensory deficit. GCS eye subscore is 4. GCS verbal subscore is 5. GCS motor subscore is 6.  Skin: Skin is warm and dry. No abrasion and no rash noted.  Psychiatric: She has a normal mood and affect. Her speech is normal and behavior is normal.  Nursing note and vitals reviewed.    ED Treatments / Results  Labs (all labs ordered are listed, but only abnormal results are displayed) Labs Reviewed - No data to display  EKG None  Radiology No results found.  Procedures .Sedation Date/Time: 01/24/2018 12:30 PM Performed by: Lacretia Leigh, MD Authorized by: Lacretia Leigh, MD   Consent:    Consent obtained:  Written   Consent given by:  Patient Universal protocol:    Immediately prior to procedure a time out was called: yes   Pre-sedation assessment:    Time since last food or drink:  Last pm   ASA classification: class 2 - patient with mild systemic disease     Neck mobility: normal     Mouth opening:  3 or more finger widths   Mallampati score:  I - soft palate, uvula, fauces, pillars visible   Pre-sedation assessments completed and reviewed: airway patency     Pre-sedation assessment completed:  01/24/2018 12:50 PM Immediate pre-procedure details:    Reassessment: Patient reassessed immediately prior to procedure     Reviewed: vital signs     Verified: bag valve mask available   Procedure details (see MAR for exact dosages):    Sedation:  Ketamine and propofol   Intra-procedure monitoring:  Blood pressure monitoring and cardiac monitor   Total Provider sedation time  (minutes):  20 Post-procedure details:    Post-sedation assessment completed:  01/24/2018 1:00 PM   Attendance: Constant attendance by certified staff until patient recovered     Recovery: Patient returned to pre-procedure baseline     Post-sedation assessments completed and reviewed: airway patency     Patient tolerance:  Tolerated well, no immediate complications Reduction of dislocation Date/Time: 01/24/2018 12:30 PM Performed by: Lacretia Leigh, MD Authorized by: Lacretia Leigh, MD  Consent: Written consent obtained. Local anesthesia used: no  Anesthesia: Local anesthesia used: no  Sedation: Patient sedated: yes Vitals: Vital signs were monitored during sedation.  Patient tolerance: Patient tolerated the procedure well with no immediate complications Comments: Reduction unsuccessful    (including critical care time)  Medications Ordered in ED Medications - No data to display   Initial Impression / Assessment and Plan / ED Course  I have reviewed the triage vital signs and the nursing notes.  Pertinent labs & imaging results that were available during my care of the patient were reviewed by me and considered in my medical decision making (see chart for details).     Patient's hip reduction attempted by Dr. Sharol Given to and was unsuccessful.  Patient to be admitted for reduction and repair  Final Clinical Impressions(s) / ED Diagnoses   Final diagnoses:  None    ED Discharge Orders    None  Lacretia Leigh, MD 01/24/18 1341

## 2018-01-24 NOTE — ED Notes (Signed)
Bed: UL84 Expected date:  Expected time:  Means of arrival:  Comments: EMS-fall-hip pain

## 2018-01-24 NOTE — ED Notes (Signed)
Per patient request, patient's daughter, [name], called and updated.

## 2018-01-24 NOTE — Brief Op Note (Signed)
01/24/2018  6:08 PM  PATIENT:  [name]  84 y.o. female  PRE-OPERATIVE DIAGNOSIS:  dislocated right total hip  POST-OPERATIVE DIAGNOSIS:  dislocated right total hip  PROCEDURE:  Procedure(s): OPEN REDUCTION OF DISLOCATED ANTERIOR HIP WITH REVISION OF LINER AND HIP BALL (Right)  SURGEON:  Surgeon(s) and Role:    [name], MD - Primary  ANESTHESIA:   general  EBL:  450 mL   BLOOD ADMINISTERED:500 CC PRBC  COUNTS:  YES  DICTATION: .Other Dictation: Dictation Number [id number]  PLAN OF CARE: Admit to inpatient   PATIENT DISPOSITION:  PACU - hemodynamically stable.   Delay start of Pharmacological VTE agent (>24hrs) due to surgical blood loss or risk of bleeding: no

## 2018-01-24 NOTE — Sedation Documentation (Addendum)
XR at bedside

## 2018-01-24 NOTE — Progress Notes (Signed)
Verbal order rec to Type and Screen pt preop for surgery, IV established, blood drawn and hand carried to lab. Lab informed to prepare 2 units of blood per [facility] orders

## 2018-01-24 NOTE — Anesthesia Postprocedure Evaluation (Signed)
Anesthesia Post Note  Patient: KELIN NIXON  Procedure(s) Performed: OPEN REDUCTION OF DISLOCATED ANTERIOR HIP WITH REVISION OF LINER AND HIP BALL (Right Hip)     Patient location during evaluation: PACU Anesthesia Type: General Level of consciousness: awake and alert Pain management: pain level controlled Vital Signs Assessment: post-procedure vital signs reviewed and stable Respiratory status: spontaneous breathing, nonlabored ventilation, respiratory function stable and patient connected to nasal cannula oxygen Cardiovascular status: stable Postop Assessment: no apparent nausea or vomiting Anesthetic complications: no Comments: To stepdown overnight secondary to requiring neosynephrine drip to maintain BP    Last Vitals:  Vitals:   01/24/18 1930 01/24/18 1940  BP: (!) 90/54 (!) 93/58  Pulse: 75 76  Resp: 14 14  Temp:    SpO2: 98% 100%    Last Pain:  Vitals:   01/24/18 1930  TempSrc:   PainSc: Asleep                 Tarea Skillman S

## 2018-01-24 NOTE — Sedation Documentation (Signed)
Unsuccessful attempt by MD.

## 2018-01-24 NOTE — ED Notes (Signed)
Per patient request, patient's daughter, [name], updated.

## 2018-01-24 NOTE — Addendum Note (Signed)
Addendum  created 01/24/18 2033 by Myrtie Soman, MD   Order list changed

## 2018-01-24 NOTE — Progress Notes (Signed)
PHARMACIST - PHYSICIAN ORDER COMMUNICATION  CONCERNING: P&T Medication Policy on Herbal Medications  DESCRIPTION:  This patient's order for:  Biotin  has been noted.  This product(s) is classified as an "herbal" or natural product. Due to a lack of definitive safety studies or FDA approval, nonstandard manufacturing practices, plus the potential risk of unknown drug-drug interactions while on inpatient medications, the Pharmacy and Therapeutics Committee does not permit the use of "herbal" or natural products of this type within [place].   ACTION TAKEN: The pharmacy department is unable to verify this order at this time and your patient has been informed of this safety policy. Please reevaluate patient's clinical condition at discharge and address if the herbal or natural product(s) should be resumed at that time.  Jasimine Simms, PharmD 

## 2018-01-24 NOTE — ED Notes (Signed)
Sharol Given MD, Zenia Resides MD, RT, NT and primary RN at bedside.

## 2018-01-24 NOTE — Transfer of Care (Signed)
Immediate Anesthesia Transfer of Care Note  Patient: [name]  Procedure(s) Performed: OPEN REDUCTION OF DISLOCATED ANTERIOR HIP WITH REVISION OF LINER AND HIP BALL (Right Hip)  Patient Location: PACU  Anesthesia Type:General  Level of Consciousness: awake, alert  and oriented  Airway & Oxygen Therapy: Patient Spontanous Breathing and Patient connected to face mask oxygen  Post-op Assessment: Report given to RN and Post -op Vital signs reviewed and stable  Post vital signs: Reviewed and stable on phenylephrine gtt. 2 unit of PRBC running.   Last Vitals:  Vitals Value Taken Time  BP 72/44 01/24/2018  6:15 PM  Temp    Pulse    Resp 15 01/24/2018  6:16 PM  SpO2    Vitals shown include unvalidated device data.  Last Pain:  Vitals:   01/24/18 1132  TempSrc:   PainSc: 0-No pain         Complications: No apparent anesthesia complications

## 2018-01-24 NOTE — ED Notes (Addendum)
Sharol Given MD at bedside.

## 2018-01-24 NOTE — Progress Notes (Signed)
Notified Dr. Kalman Shan patients blood pressure is 87/53 MAP 64 on 60 of Neo and unit of blood transfused. Per [facility] will order an ICU bed. Will continue to monitor closely.

## 2018-01-24 NOTE — Anesthesia Procedure Notes (Signed)
Procedure Name: Intubation Date/Time: 01/24/2018 3:57 PM Performed by: Niel Hummer, CRNA Pre-anesthesia Checklist: Patient identified, Emergency Drugs available, Suction available and Patient being monitored Patient Re-evaluated:Patient Re-evaluated prior to induction Oxygen Delivery Method: Circle system utilized Preoxygenation: Pre-oxygenation with 100% oxygen Induction Type: IV induction Ventilation: Mask ventilation without difficulty Laryngoscope Size: Mac and 4 Grade View: Grade I Tube type: Oral Tube size: 7.0 mm Number of attempts: 1 Airway Equipment and Method: Stylet Placement Confirmation: ETT inserted through vocal cords under direct vision,  positive ETCO2 and breath sounds checked- equal and bilateral Secured at: 21 cm Tube secured with: Tape Dental Injury: Teeth and Oropharynx as per pre-operative assessment

## 2018-01-24 NOTE — Consult Note (Signed)
PULMONARY / CRITICAL CARE MEDICINE   NAME:  [name], MRN:  [id number], DOB:  [birth year], LOS: 0 ADMISSION DATE:  01/24/2018, CONSULTATION DATE: 01/24/2018 REFERRING MD: Ninfa Linden, CHIEF COMPLAINT: Hypotension  BRIEF HISTORY:    84 year old lady with dislocated right hip prosthesis after a fall went for open reduction came out hypotensive requiring phenylephrine drip.   HISTORY OF PRESENT ILLNESS   [name] is an 84 year old lady who had a right total hip arthroplasty last month and dislocated her hip she underwent close reduction and was doing well until she fell again today and dislocated her prosthesis patient was taken to the operating room for open reduction of the prosthesis and postoperatively she was hypotensive requiring phenylephrine drip.  Patient received 2 units of packed RBCs and 1 bolus of albumin and was brought into the intensive care unit.  Patient is nauseated but complains of pain at her hip she denies any chest pain denies any shortness of breath.  No complication during the OR as per report.   SIGNIFICANT PAST MEDICAL HISTORY   -History of lung cancer status post lobectomy  SIGNIFICANT EVENTS:  Dislocated right hip arthroplasty 01/24/2018 STUDIES:     CULTURES:    ANTIBIOTICS:  Cefazolin perioperatively  LINES/TUBES:    CONSULTANTS:  Orthopedics  SUBJECTIVE:    CONSTITUTIONAL: BP (!) 85/55   Pulse 72   Temp 99 F (37.2 C)   Resp 16   Ht 5\' 2"  (1.575 m)   Wt 47.6 kg   SpO2 97%   BMI 19.20 kg/m   I/O last 3 completed shifts: In: 1830 [I.V.:1100; Blood:630; IV Piggyback:100] Out: 1017 [Urine:1035; Blood:450]        PHYSICAL EXAM: General: Acutely ill but nauseated Neuro: Alert oriented time place and person cranial nerves I to XII are intact HEENT: Dry mucous membranes Cardiovascular: Normal heart sound no added sounds or murmurs Lungs: Clear equal air sound bilaterally no crackles no wheezing Abdomen: Soft no tenderness no  organomegaly Musculoskeletal: Cold extremities Skin: No rash  RESOLVED PROBLEM LIST   ASSESSMENT AND PLAN   Assessment: -Hypotension postoperatively second to hypovolemia  -Hypovolemic shock on vasopressors -Right hip arthroplasty dislocation -Hyperkalemia -Hyponatremia  Plan: -Titrate phenylephrine to keep map above 65 -I will bolus with 1 L of LR -Patient received 2 units of packed RBCs and 1 of albumin -Repeat BMP and follow sodium level and potassium levels -Pain control -N.p.o. for now  I have spent 40 minutes of critical care time this time was spent bedside or in the unit this time was exclusive any billable procedures patient is needing to scan due to hypovolemic shock requiring vasopressors  SUMMARY OF TODAY'S PLAN:    Best Practice / Goals of Care / Disposition.   DVT PROPHYLAXIS: SCDs SUP: None NUTRITION: As per surgery MOBILITY: As per surgery GOALS OF CARE: Full code FAMILY DISCUSSIONS: No family around [place] ICU admission  LABS  Glucose No results for input(s): GLUCAP in the last 168 hours.  BMET Recent Labs  Lab 01/24/18 1248  NA 132*  K 5.3*  CL 100  CO2 23  BUN 23  CREATININE 0.97  GLUCOSE 97    Liver Enzymes No results for input(s): AST, ALT, ALKPHOS, BILITOT, ALBUMIN in the last 168 hours.  Electrolytes Recent Labs  Lab 01/24/18 1248  CALCIUM 8.9    CBC Recent Labs  Lab 01/24/18 1248  WBC 6.6  HGB 8.9*  HCT 28.7*  PLT 189    ABG No results for input(s):  PHART, PCO2ART, PO2ART in the last 168 hours.  Coag's No results for input(s): APTT, INR in the last 168 hours.  Sepsis Markers No results for input(s): LATICACIDVEN, PROCALCITON, O2SATVEN in the last 168 hours.  Cardiac Enzymes No results for input(s): TROPONINI, PROBNP in the last 168 hours.  PAST MEDICAL HISTORY :   She  has a past medical history of Arthralgia of multiple joints, Arthritis, Chronic constipation, Chronic inflammatory arthritis, Dry eyes,  GERD (gastroesophageal reflux disease), H/O discoid lupus erythematosus, Hiatal hernia, History of colon polyps, Hypothyroidism, Iron deficiency anemia, LBBB (left bundle branch block) (2010), Mitchell's disease (erythromelalgia) ([place]), Nocturia, Non-small cell cancer of right lung [facility]) (surgeon-- dr gerhardt/  oncologist-  dr Julien Nordmann--- per lov notes no recurrence/   11-18-2017 per pt denies any symptoms), OA (osteoarthritis), Osteoporosis, PONV (postoperative nausea and vomiting), Raynaud's phenomenon (1965), Renal insufficiency, Rheumatoid arthritis ([place]), Sciatica, Scoliosis, Sjogren's syndrome ([place]), and Urgency of urination.  PAST SURGICAL HISTORY:  She  has a past surgical history that includes Thoracic sympathetectomy (1965); Colonoscopy; Cardiovascular stress test (12/2008); Video bronchoscopy with endobronchial navigation (N/A, 05/04/2013); Video bronchoscopy (N/A, 06/07/2013); Video assisted thoracoscopy (vats)/wedge resection (Right, 06/07/2013); Thoracotomy (06/07/2013); Lymph node dissection (Right, 06/07/2013); Total hip arthroplasty (Right, 04/28/2014); transthoracic echocardiogram (12/11/2008); Total abdominal hysterectomy (1980's ); Appendectomy (1950s); Tonsillectomy (child); and Anterior hip revision (Right, 11/27/2017).  Allergies  Allergen Reactions  . Amlodipine Rash  . Prochlorperazine Edisylate Anaphylaxis    Compazine--- tongue swells and rash  . Aspirin Other (See Comments)    nose bleeds  . Cymbalta [Duloxetine Hcl] Diarrhea, Nausea And Vomiting and Other (See Comments)    Increased blood pressure  . Pamelor [Nortriptyline Hcl] Diarrhea and Nausea Only    Increased Heart rate and BP    No current facility-administered medications on file prior to encounter.    Current Outpatient Medications on File Prior to Encounter  Medication Sig  . Biotin 1000 MCG tablet Take 1,000 mcg by mouth daily.   . Calcium Carbonate-Vitamin D (CALCIUM 500 + D) 500-125 MG-UNIT TABS Take 1  tablet by mouth daily.   Marland Kitchen docusate sodium (STOOL SOFTENER) 100 MG capsule Take 100 mg by mouth 2 (two) times daily.    Marland Kitchen gabapentin (NEURONTIN) 300 MG capsule TAKE 300MG  IN THE MORNING, 300MG  IN THE AFTERNOON, AND 600MG  AT BEDTIME. (Patient taking differently: Take 300-600 mg by mouth See admin instructions. Take 300mg  in the morning, 300mg  in the afternoon, and 600mg  at bedtime.)  . Glucosamine-Chondroit-Vit C-Mn (GLUCOSAMINE CHONDR 1500 COMPLX PO) Take 1 capsule by mouth daily.   Marland Kitchen HYDROcodone-acetaminophen (NORCO/VICODIN) 5-325 MG tablet Take 1 tablet by mouth every 4 (four) hours as needed for severe pain.  Marland Kitchen levothyroxine (SYNTHROID, LEVOTHROID) 88 MCG tablet TAKE 1 TABLET (88 MCG TOTAL) BY MOUTH DAILY BEFORE BREAKFAST. (Patient taking differently: Take 88 mcg by mouth at bedtime. Per pt takes at 10pm)  . meloxicam (MOBIC) 7.5 MG tablet Take 7.5 mg by mouth 2 (two) times daily as needed for pain.  . methocarbamol (ROBAXIN) 500 MG tablet TAKE 1 TABLET (500 MG TOTAL) BY MOUTH EVERY 6 (SIX) HOURS AS NEEDED FOR MUSCLE SPASMS.  . Multiple Vitamin (MULTIVITAMIN) tablet Take 1 tablet by mouth daily.    Marland Kitchen omeprazole (PRILOSEC) 20 MG capsule TAKE 1 CAPSULE BY MOUTH 2 TIMES DAILY BEFORE A MEAL. (Patient taking differently: Take 20 mg by mouth 2 (two) times daily before a meal. TAKE 1 CAPSULE BY MOUTH 2 TIMES DAILY BEFORE A MEAL.)  . spironolactone (ALDACTONE) 25  MG tablet Take 12.5 mg by mouth every morning.     FAMILY HISTORY:   Her family history includes Cancer in her father; Colon cancer in her father; Coronary artery disease in her father; Diabetes in her father; Healthy in her daughter and sister; Other in her brother; Other (age of onset: 44) in her mother. There is no history of Esophageal cancer, Kidney disease, or Liver disease.  SOCIAL HISTORY:  She  reports that she has never smoked. She has never used smokeless tobacco. She reports that she drank alcohol. She reports that she does not use  drugs.  REVIEW OF SYSTEMS:    All 11 point system review were unremarkable other than what is mentioned history of present illness

## 2018-01-24 NOTE — Progress Notes (Signed)
Since admission to PACU at 1801 Nep drip has been titrated to attempt MAP of 60. Pressures extremely labile. Remains alert and responsive, Dr. Kalman Shan at bedside periodically and is being kept apprised of vitals/blood infusion/neo.

## 2018-01-24 NOTE — Sedation Documentation (Addendum)
Unsuccessful attempt by Sharol Given MD.

## 2018-01-24 NOTE — Op Note (Signed)
NAME: [name], [name] MEDICAL RECORD [id number] ACCOUNT [id number] DATE OF BIRTH:October 12, 1930 FACILITY: [facility] LOCATION: [facility] PHYSICIAN:Leanor Voris Kerry Fort, MD  OPERATIVE REPORT  DATE OF PROCEDURE:  01/24/2018  PREOPERATIVE DIAGNOSES:  Right unstable total hip arthroplasty, status post acetabular revision with 2 dislocations status post hard mechanical falls.  POSTOPERATIVE DIAGNOSES:  Right unstable total hip arthroplasty, status post acetabular revision with 2 dislocations status post mechanical falls.  PROCEDURE: 1.  Open reduction of prosthetic right hip dislocation. 2.  Removal of polyethylene liner and hip ball from right hip with exchange of 2 polyethylene offset liners and a longer ball.  IMPLANTS: 1.  Changing out DePuy size 36+0 neutral polyethylene liner for a 52 acetabular component to a 36+4 liner for a size 52 acetabular component.  2.  Exchange of a 36+5 metal hip ball to a 36+8.5 metal hip ball.    INTRAOPERATIVE FINDINGS:  Traumatic fracture of the greater trochanter that appears subacute.  SURGEON:  Lind Guest. Ninfa Linden, MD  ANESTHESIA:  General.  ESTIMATED BLOOD LOSS:  450 mL.  BLOOD:  1 unit packed red blood cells in the OR and 1 unit of packed red blood cells in the PACU.  ANTIBIOTICS:  Two grams IV Ancef.  COMPLICATIONS:  None.  INDICATIONS:  The patient is a very pleasant 84 year old female well known to me.  We performed a right total hip arthroplasty several years ago.  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  It has become in a more vertically oriented position.  She was having pain in the groin consistent with loosening, and she had shortened on that side.  In early October of this year, she was taken to the operating room, and she underwent  anterior hip revision arthroplasty of the acetabular component only.  I was pleased with being able to go up a size in  acetabular component and bring her into a better incline and anteverted position, and we got her leg lengths back as well.  She has  actually done well for a month, and then she slipped in her kitchen very hard, falling directly on her right hip.  This then dislocated the hip.  She had had no symptoms of instability before this.  She was taken to the Emergency Room, and a closed  reduction under sedation was performed by the ER staff.  She was given a followup in my office.  The hip was reduced, and she was pain free, but when I scrutinized those pictures, you could see that it was actually a fracture of the greater trochanter.   I surmised that with her being so thin and cachectic that this has changed her hip abductors and weakened them, making for a more unstable hip.  I did see her in followup in the office, and she was ambulating without any pain.  We had her adhere to  strict anterior hip precautions with no excessive internal rotation and really no abduction of the hip.  She had actually been doing well, and then this morning fell in her foyer after getting ready for church, slipping significantly on a marble floor,  landing hard again, dislocating the hip once again.  At this point, the ER staff could not sedate her enough to reduce her, so I recommended an open reduction with the ability to assess the soft tissues to get an idea of why she is coming out.  DESCRIPTION OF PROCEDURE:  After informed consent was  obtained and appropriate right hip was marked, she was brought to the operating room with her hip dislocated.  General anesthesia was then obtained.  I placed traction boots on both her feet and  placed her supine on the Hana fracture table with the perineal post in place and both legs in in-line skeletal traction device and no traction applied.  Under direct fluoroscopy, I was then able to easily pull traction on her hip, and then we were able  to reduce it.  I took her out of the traction and  actually put her through internal and external rotation.  It was reduced and stable; however, you can see there is a fracture of the greater trochanter, and this corresponded with films that I saw from a  few weeks ago.  At that point, I felt that she needed to have tighter hip abductors, so I felt open surgery was warranted.  Her right hip was prepped and draped with DuraPrep and sterile drapes.  A timeout was called, and she was identified as correct  patient, correct right hip.  We then made an incision going through our previous incision for direct anterior approach to the hip.  We had created an interval going through an abundant amount of scar tissue from 2 previous surgeries, and I was able to  finally expose the hip itself.  It was absolutely difficult to reduce, but I could see right away that there was definitely a large subacute fracture of the greater trochanter.  This has definitely weakened her hip abductors.  Once I was able to finally  dislocate the hip, I felt like it was prudent to remove the old liner and then place a new +4 liner to give her more offset.  I then went with a larger hip ball when taking off her old hip ball going from a size 36+5 to a 36+8.5.  This significantly  lengthened her.  We then reduced in the acetabulum.  There was no shuck at all, and it absolutely made her leg lengths longer.  This tightened up the abductors as well.  I then irrigated the soft tissue with normal saline solution using pulsatile lavage.   I found that bone at the greater trochanter to be significantly osteopenic.  I was able to place a FiberWire suture with #2 FiberWire through that and try to tighten that to the greater trochanter area.  I then closed the deep tissue with #1 Ethibond  suture, followed by #1 in the tensor fascia, 0 Vicryl in the deep tissue, 2-0 Vicryl subcutaneous tissue and interrupted staples on the skin.  Xeroform and Aquacel dressing was applied.  We took her off the [place]  table.  She was awakened, extubated, and  taken to recovery room in stable condition.  Of note, we could definitely tell that she is definitely longer leg lengthwise,  I did place a knee immobilizer, and we will admit her to get her up with therapy with again strict anterior hip precautions.  LN/NUANCE  D:01/24/2018 T:01/24/2018 JOB:[id number]

## 2018-01-24 NOTE — Anesthesia Preprocedure Evaluation (Signed)
Anesthesia Evaluation  Patient identified by MRN, date of birth, ID band Patient awake    Reviewed: Allergy & Precautions, NPO status , Patient's Chart, lab work & pertinent test results  Airway Mallampati: II  TM Distance: >3 FB Neck ROM: Full    Dental no notable dental hx.    Pulmonary neg pulmonary ROS,    Pulmonary exam normal breath sounds clear to auscultation       Cardiovascular + Peripheral Vascular Disease  Normal cardiovascular exam+ Valvular Problems/Murmurs AI  Rhythm:Regular Rate:Normal + Systolic murmurs    Neuro/Psych negative neurological ROS  negative psych ROS   GI/Hepatic Neg liver ROS, GERD  ,  Endo/Other  negative endocrine ROS  Renal/GU negative Renal ROS  negative genitourinary   Musculoskeletal  (+) Arthritis , Rheumatoid disorders,    Abdominal   Peds negative pediatric ROS (+)  Hematology negative hematology ROS (+) anemia ,   Anesthesia Other Findings   Reproductive/Obstetrics negative OB ROS                             Anesthesia Physical Anesthesia Plan  ASA: III and emergent  Anesthesia Plan: General   Post-op Pain Management:    Induction: Intravenous  PONV Risk Score and Plan: 3 and Ondansetron, Dexamethasone and Treatment may vary due to age or medical condition  Airway Management Planned: Oral ETT  Additional Equipment:   Intra-op Plan:   Post-operative Plan: Extubation in OR  Informed Consent: I have reviewed the patients History and Physical, chart, labs and discussed the procedure including the risks, benefits and alternatives for the proposed anesthesia with the patient or authorized representative who has indicated his/her understanding and acceptance.   Dental advisory given  Plan Discussed with: CRNA and Surgeon  Anesthesia Plan Comments:         Anesthesia Quick Evaluation

## 2018-01-24 NOTE — ED Triage Notes (Signed)
Pt slipped today and fell to her left side with no LOC or back pain.  EMS noted right sided rotation and deformity to right leg.  Reports pain 5/10.  Hx Right hip replacement.  Pt also fell 3 weeks ago and saw the surgeon who told pt that if it happened again, she would need a new hip.

## 2018-01-24 NOTE — Progress Notes (Signed)
Wrong time inputed

## 2018-01-24 NOTE — H&P (Addendum)
[name] is an 84 y.o. female.   Chief Complaint: Dislocated right total hip arthroplasty HPI: Patient is an 84 year old woman who is status post a right total hip arthroplasty.  Last month she had a traumatic fall and dislocated the hip.  Patient underwent close reduction and was doing well until she had another fall today.  Patient states she was in her closet trying to put on a jacket when she lost her balance reached for the walker and fell on her right hip sustaining a dislocation.  Past Medical History:  Diagnosis Date  . Arthralgia of multiple joints    followed by dr Gerilyn Nestle  . Arthritis   . Chronic constipation   . Chronic inflammatory arthritis    rhemotolgist-  dr a. Gerilyn Nestle [facility])  . Dry eyes    eye drops used   . GERD (gastroesophageal reflux disease)   . H/O discoid lupus erythematosus   . Hiatal hernia   . History of colon polyps   . Hypothyroidism   . Iron deficiency anemia   . LBBB (left bundle branch block) 2010  . Mitchell's disease (erythromelalgia) [name])    neurologist-  dr patel  . Nocturia   . Non-small cell cancer of right lung [facility]) surgeon-- dr gerhardt/  oncologist-  dr Julien Nordmann--- per lov notes no recurrence/   11-18-2017 per pt denies any symptoms   dx 2015--  Stage IIA (T2b,N0,M0) , +EGFR  mutation in exon 21, non-small cell adenocarcinoma right upper lobe---  s/p  Right upper lobectomy , right middley wedge resection and node dissection---  no chemo or radiation therapy  . OA (osteoarthritis)    hands  . Osteoporosis   . PONV (postoperative nausea and vomiting)    likes phenergan  . Raynaud's phenomenon 1965  . Renal insufficiency   . Rheumatoid arthritis ([place])   . Sciatica   . Scoliosis   . Sjogren's syndrome ([place])   . Urgency of urination     Past Surgical History:  Procedure Laterality Date  . ANTERIOR HIP REVISION Right 11/27/2017   Procedure: RIGHT HIP ACETABULAR REVISION;  Surgeon: [name], MD;   Location: [facility];  Service: Orthopedics;  Laterality: Right;  . APPENDECTOMY  1950s  . CARDIOVASCULAR STRESS TEST  12/2008    mild fixed basal to mid septal perfusion defect felt likely due to artifact from LBBB, no ischemia, EF 58%  . COLONOSCOPY    . LYMPH NODE DISSECTION Right 06/07/2013   Procedure: LYMPH NODE DISSECTION;  Surgeon: [name], MD;  Location: [place];  Service: Thoracic;  Laterality: Right;  . [place]   "large incision from chest to up to shoulder, the nerves were tied together, for raynaud's  . THORACOTOMY  06/07/2013   Procedure: MINI/LIMITED THORACOTOMY; right middle lobe wedge resection;  Surgeon: [name], MD;  Location: [place];  Service: Thoracic;;  . TONSILLECTOMY  child  . TOTAL ABDOMINAL HYSTERECTOMY  1980's    W/ BSO  . TOTAL HIP ARTHROPLASTY Right 04/28/2014   Procedure: RIGHT TOTAL HIP ARTHROPLASTY ANTERIOR APPROACH;  Surgeon: [name], MD;  Location: [facility];  Service: Orthopedics;  Laterality: Right;  . TRANSTHORACIC ECHOCARDIOGRAM  12/11/2008   ef 03-47%, grade 1 diastolic dysfunction/  mild LAE/  mild AR and MR/  trivial TR  . VIDEO ASSISTED THORACOSCOPY (VATS)/WEDGE RESECTION Right 06/07/2013   Procedure: VIDEO ASSISTED THORACOSCOPY (VATS)/right upper lobectomy, On Q;  Surgeon: [name], MD;  Location: [facility];  Service: Thoracic;  Laterality: Right;  Marland Kitchen VIDEO BRONCHOSCOPY N/A 06/07/2013   Procedure: VIDEO BRONCHOSCOPY;  Surgeon: [name], MD;  Location: [facility] OR;  Service: Thoracic;  Laterality: N/A;  . VIDEO BRONCHOSCOPY WITH ENDOBRONCHIAL NAVIGATION N/A 05/04/2013   Procedure: VIDEO BRONCHOSCOPY WITH ENDOBRONCHIAL NAVIGATION;  Surgeon: [name], MD;  Location: [facility] OR;  Service: Thoracic;  Laterality: N/A;    Family History  Problem Relation Age of Onset  . Coronary artery disease Father   . Colon cancer Father   . Diabetes Father   . Cancer Father        colon  . Other Mother 73        MVA  . Healthy Sister   . Other Brother        Killed on war  . Healthy Daughter   . Esophageal cancer Neg Hx   . Kidney disease Neg Hx   . Liver disease Neg Hx    Social History:  reports that she has never smoked. She has never used smokeless tobacco. She reports that she drank alcohol. She reports that she does not use drugs.  Allergies:  Allergies  Allergen Reactions  . Amlodipine Rash  . Prochlorperazine Edisylate Anaphylaxis    Compazine--- tongue swells and rash  . Aspirin Other (See Comments)    nose bleeds  . Cymbalta [Duloxetine Hcl] Diarrhea, Nausea And Vomiting and Other (See Comments)    Increased blood pressure  . Pamelor [Nortriptyline Hcl] Diarrhea and Nausea Only    Increased Heart rate and BP     (Not in a hospital admission)  Results for orders placed or performed during the hospital encounter of 01/24/18 (from the past 48 hour(s))  CBC with Differential/Platelet     Status: Abnormal   Collection Time: 01/24/18 12:48 PM  Result Value Ref Range   WBC 6.6 4.0 - 10.5 K/uL   RBC 3.08 (L) 3.87 - 5.11 MIL/uL   Hemoglobin 8.9 (L) 12.0 - 15.0 g/dL   HCT 28.7 (L) 36.0 - 46.0 %   MCV 93.2 80.0 - 100.0 fL   MCH 28.9 26.0 - 34.0 pg   MCHC 31.0 30.0 - 36.0 g/dL   RDW 19.2 (H) 11.5 - 15.5 %   Platelets 189 150 - 400 K/uL   nRBC 0.0 0.0 - 0.2 %   Neutrophils Relative % 78 %   Neutro Abs 5.2 1.7 - 7.7 K/uL   Lymphocytes Relative 11 %   Lymphs Abs 0.7 0.7 - 4.0 K/uL   Monocytes Relative 8 %   Monocytes Absolute 0.5 0.1 - 1.0 K/uL   Eosinophils Relative 1 %   Eosinophils Absolute 0.0 0.0 - 0.5 K/uL   Basophils Relative 1 %   Basophils Absolute 0.0 0.0 - 0.1 K/uL   Immature Granulocytes 1 %   Abs Immature Granulocytes 0.03 0.00 - 0.07 K/uL    Comment: Performed at [facility], [place] [address], [place], [place] [address]  Basic metabolic panel     Status: Abnormal   Collection Time: 01/24/18 12:48 PM  Result Value Ref Range   Sodium 132  (L) 135 - 145 mmol/L   Potassium 5.3 (H) 3.5 - 5.1 mmol/L   Chloride 100 98 - 111 mmol/L   CO2 23 22 - 32 mmol/L   Glucose, Bld 97 70 - 99 mg/dL   BUN 23 8 - 23 mg/dL   Creatinine, Ser 0.97 0.44 - 1.00 mg/dL   Calcium 8.9 8.9 -  10.3 mg/dL   GFR calc non Af Amer 53 (L) >60 mL/min   GFR calc Af Amer >60 >60 mL/min   Anion gap 9 5 - 15    Comment: Performed at [facility], [place] [address] [place], [place] [address]   Dg Hip [place] Pelvis 1 View Right  Result Date: 01/24/2018 CLINICAL DATA:  Attempted right hip dislocation reduction EXAM: DG HIP (WITH OR WITHOUT PELVIS) 1V PORT RIGHT COMPARISON:  None. FINDINGS: Right hip arthroplasty. Persistent right superior dislocation. Persistent ununited right greater trochanteric fracture noted and unchanged in the prior exam. No aggressive osseous lesion. IMPRESSION: 1. Right hip arthroplasty. Persistent right superior dislocation. Electronically Signed   By: Kathreen Devoid   On: 01/24/2018 12:33   Dg Hip Unilat W Or Wo Pelvis 2-3 Views Right  Result Date: 01/24/2018 EXAM: DG HIP (WITH OR WITHOUT PELVIS) 2-3V RIGHT COMPARISON:  None. FINDINGS: Right total hip arthroplasty. Superior anterior right hip dislocation. No acute fracture. IMPRESSION: Right hip arthroplasty with superior anterior dislocation. Electronically Signed   By: Kathreen Devoid   On: 01/24/2018 10:55    Review of Systems  All other systems reviewed and are negative.   Blood pressure (!) 163/81, pulse 77, temperature 97.6 F (36.4 C), temperature source Oral, resp. rate 20, height '5\' 2"'$  (1.575 m), weight 47.6 kg, SpO2 100 %. Physical Exam  On examination patient is alert oriented no adenopathy well-dressed normal affect normal respiratory effort.  Her right lower extremity is shortened and externally rotated.  She has a good dorsalis pedis pulse bilaterally.  Review of the radiographs shows a dislocated right total hip arthroplasty.   Diagnosis Assessment/Plan Assessment: Recurrent dislocation right total hip arthroplasty status post a second traumatic fall.  Plan: Patient underwent a attempted closed reduction under conscious sedation with Dr. Zenia Resides and a second attempted closed reduction by myself with Dr. Zenia Resides providing conscious sedation.  The hip was unable to be reduced.  Discussed this with Dr. Ninfa Linden and he feels like a revision of the acetabular liner is the next best treatment.  He will plan for surgical intervention for revision of the acetabular liner.  I discussed this with the patient and her daughter as well as with her daughter over the phone.  They both understand the risks and benefits and wished to proceed with surgical revision.  Newt Minion, MD 01/24/2018, 1:45 PM

## 2018-01-25 DIAGNOSIS — E271 Primary adrenocortical insufficiency: Secondary | ICD-10-CM

## 2018-01-25 DIAGNOSIS — M25351 Other instability, right hip: Secondary | ICD-10-CM

## 2018-01-25 DIAGNOSIS — Z96641 Presence of right artificial hip joint: Secondary | ICD-10-CM

## 2018-01-25 LAB — BASIC METABOLIC PANEL
ANION GAP: 8 (ref 5–15)
BUN: 23 mg/dL (ref 8–23)
CO2: 17 mmol/L — ABNORMAL LOW (ref 22–32)
Calcium: 7.1 mg/dL — ABNORMAL LOW (ref 8.9–10.3)
Chloride: 111 mmol/L (ref 98–111)
Creatinine, Ser: 1.26 mg/dL — ABNORMAL HIGH (ref 0.44–1.00)
GFR calc Af Amer: 45 mL/min — ABNORMAL LOW (ref >60)
GFR calc non Af Amer: 39 mL/min — ABNORMAL LOW (ref >60)
Glucose, Bld: 171 mg/dL — ABNORMAL HIGH (ref 70–99)
POTASSIUM: 5.1 mmol/L (ref 3.5–5.1)
Sodium: 136 mmol/L (ref 135–145)

## 2018-01-25 LAB — CBC
HCT: 23.9 % — ABNORMAL LOW (ref 36.0–46.0)
Hemoglobin: 7.4 g/dL — ABNORMAL LOW (ref 12.0–15.0)
MCH: 29.2 pg (ref 26.0–34.0)
MCHC: 31 g/dL (ref 30.0–36.0)
MCV: 94.5 fL (ref 80.0–100.0)
Platelets: 119 10*3/uL — ABNORMAL LOW (ref 150–400)
RBC: 2.53 MIL/uL — ABNORMAL LOW (ref 3.87–5.11)
RDW: 17.5 % — ABNORMAL HIGH (ref 11.5–15.5)
WBC: 15.3 10*3/uL — ABNORMAL HIGH (ref 4.0–10.5)
nRBC: 0 % (ref 0.0–0.2)

## 2018-01-25 LAB — PREPARE RBC (CROSSMATCH)

## 2018-01-25 LAB — MRSA PCR SCREENING: MRSA by PCR: POSITIVE — AB

## 2018-01-25 LAB — CORTISOL: Cortisol, Plasma: 7.2 ug/dL

## 2018-01-25 MED ORDER — ORAL CARE MOUTH RINSE
15.0000 mL | Freq: Two times a day (BID) | OROMUCOSAL | Status: DC
  Administered 2018-01-25 – 2018-01-31 (×11): 15 mL via OROMUCOSAL

## 2018-01-25 MED ORDER — SODIUM CHLORIDE 0.9 % IV BOLUS
1000.0000 mL | Freq: Once | INTRAVENOUS | Status: AC
  Administered 2018-01-25: 1000 mL via INTRAVENOUS

## 2018-01-25 MED ORDER — HYDROCORTISONE NA SUCCINATE PF 100 MG IJ SOLR
50.0000 mg | Freq: Four times a day (QID) | INTRAMUSCULAR | Status: DC
  Administered 2018-01-25 – 2018-01-27 (×7): 50 mg via INTRAVENOUS
  Filled 2018-01-25 (×7): qty 2

## 2018-01-25 MED ORDER — SODIUM CHLORIDE 0.9% IV SOLUTION
Freq: Once | INTRAVENOUS | Status: AC
  Administered 2018-01-25: 09:00:00 via INTRAVENOUS

## 2018-01-25 MED ORDER — FUROSEMIDE 10 MG/ML IJ SOLN
20.0000 mg | Freq: Once | INTRAMUSCULAR | Status: AC
  Administered 2018-01-25: 20 mg via INTRAVENOUS
  Filled 2018-01-25: qty 2

## 2018-01-25 MED ORDER — MUPIROCIN 2 % EX OINT
1.0000 "application " | TOPICAL_OINTMENT | Freq: Two times a day (BID) | CUTANEOUS | Status: AC
  Administered 2018-01-25 – 2018-01-29 (×10): 1 via NASAL
  Filled 2018-01-25 (×2): qty 22

## 2018-01-25 MED ORDER — CHLORHEXIDINE GLUCONATE CLOTH 2 % EX PADS
6.0000 | MEDICATED_PAD | Freq: Every day | CUTANEOUS | Status: AC
  Administered 2018-01-25 – 2018-01-29 (×4): 6 via TOPICAL

## 2018-01-25 NOTE — Progress Notes (Signed)
Dr Emmit Alexanders notified of low UOP, and drainage at incision site.  Incision with minimal serous sanguinous drainage at site.  Will reinforce and monitor.

## 2018-01-25 NOTE — Progress Notes (Signed)
Dr. Oletta Darter notified of hgb of 7.4.  Also aware of current uop, bp. Await orders

## 2018-01-25 NOTE — Evaluation (Signed)
Physical Therapy Evaluation Patient Details Name: [name] MRN: [id number] DOB: 01-14-1931 Today's Date: 01/25/2018   History of Present Illness  84 y.o. female admitted with fall, dislocation of R anterior THA. s/p open reduction and revision of liner and ball. Pt had post op hypotension.  PMH: R DA-THA 2016, R hip revision 11/27/17, RA, lung cancer Sjorgrens syndrome.   Clinical Impression  Pt admitted with above diagnosis. Pt currently with functional limitations due to the deficits listed below (see PT Problem List). Pt ambulated 10' with RW. Mod assist for bed mobility and transfers. May need ST-SNF, depending on progress.  Pt will benefit from skilled PT to increase their independence and safety with mobility to allow discharge to the venue listed below.       Follow Up Recommendations Supervision for mobility/OOB;Follow surgeon's recommendation for DC plan and follow-up therapies(pt is hopeful to DC home, may need ST-SNF depending on progress)    Equipment Recommendations  None recommended by PT    Recommendations for Other Services       Precautions / Restrictions Precautions Precautions: Fall Precaution Comments: No R hip ABDuction Restrictions Weight Bearing Restrictions: No      Mobility  Bed Mobility Overal bed mobility: Needs Assistance Bed Mobility: Supine to Sit     Supine to sit: +2 for physical assistance;Max assist     General bed mobility comments: assist to raise trunk and to pivot hips to edge of bed  Transfers Overall transfer level: Needs assistance Equipment used: Rolling walker (2 wheeled) Transfers: Sit to/from Stand Sit to Stand: +2 safety/equipment;Mod assist         General transfer comment: VCs hand placement, assist to rise  Ambulation/Gait Ambulation/Gait assistance: Min guard;+2 safety/equipment Gait Distance (Feet): 10 Feet Assistive device: Rolling walker (2 wheeled) Gait Pattern/deviations: Step-to pattern;Decreased  stride length;Antalgic Gait velocity: decr   General Gait Details: VCs sequencing, no loss of balance, distance limited by pain/fatigue  Stairs            Wheelchair Mobility    Modified Rankin (Stroke Patients Only)       Balance Overall balance assessment: History of Falls;Needs assistance Sitting-balance support: Feet supported Sitting balance-Leahy Scale: Good     Standing balance support: Bilateral upper extremity supported Standing balance-Leahy Scale: Poor Standing balance comment: relies on BUE support                             Pertinent Vitals/Pain Pain Assessment: 0-10 Pain Score: 4  Pain Location: R hip Pain Descriptors / Indicators: Sore Pain Intervention(s): Limited activity within patient's tolerance;Monitored during session;Premedicated before session(pt declined ice)    Home Living Family/patient expects to be discharged to:: Unsure Living Arrangements: Alone               Additional Comments: pt had caregiver during the day, was alone at night; daughter available to help some    Prior Function Level of Independence: Independent with assistive device(s)         Comments: used 4WW prior to admission for ambulation as needed      Hand Dominance   Dominant Hand: Right    Extremity/Trunk Assessment   Upper Extremity Assessment Upper Extremity Assessment: Overall WFL for tasks assessed    Lower Extremity Assessment Lower Extremity Assessment: RLE deficits/detail RLE Deficits / Details: Hip ABDuction NT, knee ext at least +3/5 RLE Sensation: WNL    Cervical / Trunk Assessment Cervical /  Trunk Assessment: Normal  Communication   Communication: No difficulties  Cognition Arousal/Alertness: Awake/alert Behavior During Therapy: WFL for tasks assessed/performed Overall Cognitive Status: Within Functional Limits for tasks assessed                                        General Comments       Exercises General Exercises - Lower Extremity Ankle Circles/Pumps: AROM;Both;10 reps;Supine   Assessment/Plan    PT Assessment Patient needs continued PT services  PT Problem List Decreased strength;Decreased range of motion;Decreased activity tolerance;Decreased mobility;Pain;Decreased balance       PT Treatment Interventions DME instruction;Gait training;Functional mobility training;Therapeutic activities;Therapeutic exercise;Balance training;Patient/family education    PT Goals (Current goals can be found in the Care Plan section)  Acute Rehab PT Goals Patient Stated Goal: to DC home PT Goal Formulation: With patient Time For Goal Achievement: 02/01/18 Potential to Achieve Goals: Good    Frequency Min 5X/week   Barriers to discharge        Co-evaluation               AM-PAC PT "6 Clicks" Mobility  Outcome Measure Help needed turning from your back to your side while in a flat bed without using bedrails?: A Lot Help needed moving from lying on your back to sitting on the side of a flat bed without using bedrails?: A Lot Help needed moving to and from a bed to a chair (including a wheelchair)?: A Lot Help needed standing up from a chair using your arms (e.g., wheelchair or bedside chair)?: A Lot Help needed to walk in hospital room?: A Little Help needed climbing 3-5 steps with a railing? : A Lot 6 Click Score: 13    End of Session Equipment Utilized During Treatment: Gait belt Activity Tolerance: Patient limited by pain;Patient limited by fatigue Patient left: in chair;with call bell/phone within reach Nurse Communication: Mobility status PT Visit Diagnosis: Difficulty in walking, not elsewhere classified (R26.2);Pain Pain - Right/Left: Right Pain - part of body: Hip    Time: 4403-4742 PT Time Calculation (min) (ACUTE ONLY): 27 min   Charges:   PT Evaluation $PT Eval Moderate Complexity: 1 Mod PT Treatments $Gait Training: 8-22 mins       Blondell Reveal Kistler PT 01/25/2018  Acute Rehabilitation Services Pager [phone] Office [phone]

## 2018-01-25 NOTE — Progress Notes (Signed)
eLink Physician-Brief Progress Note Patient Name: [name] DOB: November 05, 1930 MRN: [id number]   Date of Service  01/25/2018  HPI/Events of Note  Oliguria and Hypotension - Remains on Phenylephrine IV infusion.   eICU Interventions  Will order: 1. Bolus with 0.9 NaCl 1 liter IV over 60 minutes now.  2. Send AM CBC and BMP early (after 0.9 NaCl IV bolus).      Intervention Category Major Interventions: Hypotension - evaluation and management Intermediate Interventions: Oliguria - evaluation and management  Sommer,Steven Eugene 01/25/2018, 1:23 AM

## 2018-01-25 NOTE — Progress Notes (Signed)
PT Cancellation Note  Patient Details Name: [name] MRN: [id number] DOB: 1930-09-12   Cancelled Treatment:    Reason Eval/Treat Not Completed: Medical issues which prohibited therapy(Hemoglobin 7.4, pt to receive 2 units of blood, will follow. )   Philomena Doheny PT 01/25/2018  Acute Rehabilitation Services Pager [phone] Office [phone]

## 2018-01-25 NOTE — Progress Notes (Signed)
Patient ID: [name], female   DOB: 03/02/1930, 84 y.o.   MRN: [id number] I have removed her right knee immobilizer and feel that she can be without it.  I also think that she would definitely benefit from short-term skilled nursing for extra therapy that she will need post her acute hospital stay.  Will consult Social Work.

## 2018-01-25 NOTE — Progress Notes (Signed)
Subjective: 1 Day Post-Op Procedure(s) (LRB): OPEN REDUCTION OF DISLOCATED ANTERIOR HIP WITH REVISION OF LINER AND HIP BALL (Right) Patient reports pain as moderate.  Acute blood loss anemia (on chronic anemia) from her surgery.  Feels ok.  Awake and alert.  Daught at the bedside.  Pressures running low.  Objective: Vital signs in last 24 hours: Temp:  [97.6 F (36.4 C)-99 F (37.2 C)] 98.5 F (36.9 C) (12/09 0500) Pulse Rate:  [69-101] 84 (12/09 0530) Resp:  [12-24] 13 (12/09 0530) BP: (60-174)/(31-101) 108/41 (12/09 0530) SpO2:  [89 %-100 %] 100 % (12/09 0530) Weight:  [47.6 kg-51.8 kg] 51.8 kg (12/08 2100)  Intake/Output from previous day: 12/08 0701 - 12/09 0700 In: 3747 [I.V.:2117; Blood:630; IV Piggyback:1000] Out: 1630 [Urine:1180; Blood:450] Intake/Output this shift: No intake/output data recorded.  Recent Labs    01/24/18 1248 01/25/18 0315  HGB 8.9* 7.4*   Recent Labs    01/24/18 1248 01/25/18 0315  WBC 6.6 15.3*  RBC 3.08* 2.53*  HCT 28.7* 23.9*  PLT 189 119*   Recent Labs    01/24/18 1248 01/25/18 0315  NA 132* 136  K 5.3* 5.1  CL 100 111  CO2 23 17*  BUN 23 23  CREATININE 0.97 1.26*  GLUCOSE 97 171*  CALCIUM 8.9 7.1*   No results for input(s): LABPT, INR in the last 72 hours.  Sensation intact distally Intact pulses distally Dorsiflexion/Plantar flexion intact Incision: scant drainage  Assessment/Plan: 1 Day Post-Op Procedure(s) (LRB): OPEN REDUCTION OF DISLOCATED ANTERIOR HIP WITH REVISION OF LINER AND HIP BALL (Right) Up with therapy - WBAT left hip.  No hip abduction. Will transfuse 2 units of blood to treat the anemia. Continue in ICU.  If pressures improve and she does not need an ICU bed, can transfer to the Ortho floor.     [name] 01/25/2018, 7:55 AM

## 2018-01-25 NOTE — Progress Notes (Signed)
NAME:  [name], MRN:  [id number], DOB:  [birth year], LOS: 1 ADMISSION DATE:  01/24/2018, CONSULTATION DATE:  01/24/2018 REFERRING MD:  Ninfa Linden, CHIEF COMPLAINT:  Hypotension   Brief History   84 y/o female with dislocated right hip prosthesis who fell and broke her hip, after ORIF of R hip she was hypotensive requiring a phenylephrine infusion.    Past Medical History  Lung cancer, sjogren's, RA, CKD, LBB, Fe def anemia, GERD, constipation  Significant Hospital Events   12/8 ORIF  Consults:    Procedures:  12/8 ORIF  Significant Diagnostic Tests:    Micro Data:    Antimicrobials:     Interim history/subjective:  Feels better this morning Receiving a blood transfusion   Objective   Blood pressure (!) 104/50, pulse 92, temperature 98.1 F (36.7 C), temperature source Oral, resp. rate 20, height 5\' 2"  (1.575 m), weight 51.8 kg, SpO2 100 %.        Intake/Output Summary (Last 24 hours) at 01/25/2018 1047 Last data filed at 01/25/2018 1540 Gross per 24 hour  Intake 3746.95 ml  Output 1630 ml  Net 2116.95 ml   Filed Weights   01/24/18 0950 01/24/18 2100  Weight: 47.6 kg 51.8 kg    Examination:  General:  Resting comfortably in bed HENT: NCAT OP clear PULM: CTA B, normal effort CV: RRR, no mgr GI: BS+, soft, nontender MSK: normal bulk and tone Neuro: awake, alert, no distress, MAEW   Resolved Hospital Problem list     Assessment & Plan:  Post op hypotension: no evidence of sepsis or massive bleeding, doubt cardiac event. Given frailty, baseline autoimmune disease and heavy exposure to corticosteroids over the years I think she probably has adrenal insufficiency > add on cortisol to AM labs from today > wean off neosynephrine for MAP > 65 > hold off on further fluid resuscitation > check 12 lead EKG for completeness > if hypotensive again start hydrocortisone 50mg  IV q6h  Chronic microcytic iron deficient Anemia: > goal hemoglobin is >  7gm/dL > will d/c order for second transfusion > needs outpatient IV iron  AKI: mild, hypotensive related > monitor UOP > repeat BMET in AM    Best practice:  Diet: advance Pain/Anxiety/Delirium protocol (if indicated):n/a VAP protocol (if indicated): n/a DVT prophylaxis: SCD for now, when OK to use heparin/anticoagulation would use as she is high risk GI prophylaxis: n/a Glucose control: n/a Mobility: PT consult Code Status: full Family Communication: updated daughter bedside Disposition: Maintain in ICU for now, if off pressors this afternoon then would move out to floor  Labs   CBC: Recent Labs  Lab 01/24/18 1248 01/25/18 0315  WBC 6.6 15.3*  NEUTROABS 5.2  --   HGB 8.9* 7.4*  HCT 28.7* 23.9*  MCV 93.2 94.5  PLT 189 119*    Basic Metabolic Panel: Recent Labs  Lab 01/24/18 1248 01/25/18 0315  NA 132* 136  K 5.3* 5.1  CL 100 111  CO2 23 17*  GLUCOSE 97 171*  BUN 23 23  CREATININE 0.97 1.26*  CALCIUM 8.9 7.1*   GFR: Estimated Creatinine Clearance: 25.8 mL/min (A) (by C-G formula based on SCr of 1.26 mg/dL (H)). Recent Labs  Lab 01/24/18 1248 01/25/18 0315  WBC 6.6 15.3*    Liver Function Tests: No results for input(s): AST, ALT, ALKPHOS, BILITOT, PROT, ALBUMIN in the last 168 hours. No results for input(s): LIPASE, AMYLASE in the last 168 hours. No results for input(s): AMMONIA in the last 168  hours.  ABG    Component Value Date/Time   PHART 7.408 06/08/2013 0448   PCO2ART 42.7 06/08/2013 0448   PO2ART 153.0 (H) 06/08/2013 0448   HCO3 26.4 (H) 06/08/2013 0448   TCO2 27.7 06/08/2013 0448   O2SAT 99.3 06/08/2013 0448     Coagulation Profile: No results for input(s): INR, PROTIME in the last 168 hours.  Cardiac Enzymes: No results for input(s): CKTOTAL, CKMB, CKMBINDEX, TROPONINI in the last 168 hours.  HbA1C: No results found for: HGBA1C  CBG: No results for input(s): GLUCAP in the last 168 hours.   Critical care time: 35 minutes        Roselie Awkward, MD [place] [facility] Pager: [phone] Cell: [phone] If no response, call [phone]

## 2018-01-25 NOTE — Progress Notes (Signed)
[facility]  Back on phenylephrine for BP support Start hydrocortisone 50mg  IV q6h  Roselie Awkward, MD [place] [facility] Pager: [phone] Cell: [phone] If no response, call [phone]

## 2018-01-26 ENCOUNTER — Encounter (HOSPITAL_COMMUNITY): Payer: Self-pay | Admitting: Orthopaedic Surgery

## 2018-01-26 DIAGNOSIS — E2740 Unspecified adrenocortical insufficiency: Secondary | ICD-10-CM

## 2018-01-26 LAB — CBC WITH DIFFERENTIAL/PLATELET
ABS IMMATURE GRANULOCYTES: 0.12 10*3/uL — AB (ref 0.00–0.07)
Abs Immature Granulocytes: 0.08 10*3/uL — ABNORMAL HIGH (ref 0.00–0.07)
Basophils Absolute: 0 10*3/uL (ref 0.0–0.1)
Basophils Absolute: 0 10*3/uL (ref 0.0–0.1)
Basophils Relative: 0 %
Basophils Relative: 0 %
Eosinophils Absolute: 0 10*3/uL (ref 0.0–0.5)
Eosinophils Absolute: 0 10*3/uL (ref 0.0–0.5)
Eosinophils Relative: 0 %
Eosinophils Relative: 0 %
HCT: 25.5 % — ABNORMAL LOW (ref 36.0–46.0)
HEMATOCRIT: 20.9 % — AB (ref 36.0–46.0)
Hemoglobin: 6.8 g/dL — CL (ref 12.0–15.0)
Hemoglobin: 8.4 g/dL — ABNORMAL LOW (ref 12.0–15.0)
Immature Granulocytes: 1 %
Immature Granulocytes: 1 %
LYMPHS ABS: 0.5 10*3/uL — AB (ref 0.7–4.0)
Lymphocytes Relative: 6 %
Lymphocytes Relative: 4 %
Lymphs Abs: 0.4 10*3/uL — ABNORMAL LOW (ref 0.7–4.0)
MCH: 29.8 pg (ref 26.0–34.0)
MCH: 30.3 pg (ref 26.0–34.0)
MCHC: 32.5 g/dL (ref 30.0–36.0)
MCHC: 32.9 g/dL (ref 30.0–36.0)
MCV: 91.7 fL (ref 80.0–100.0)
MCV: 92.1 fL (ref 80.0–100.0)
Monocytes Absolute: 0.8 10*3/uL (ref 0.1–1.0)
Monocytes Absolute: 0.8 10*3/uL (ref 0.1–1.0)
Monocytes Relative: 9 %
Monocytes Relative: 7 %
NEUTROS ABS: 9.1 10*3/uL — AB (ref 1.7–7.7)
Neutro Abs: 7.7 10*3/uL (ref 1.7–7.7)
Neutrophils Relative %: 84 %
Neutrophils Relative %: 88 %
Platelets: 93 10*3/uL — ABNORMAL LOW (ref 150–400)
Platelets: 103 10*3/uL — ABNORMAL LOW (ref 150–400)
RBC: 2.28 MIL/uL — ABNORMAL LOW (ref 3.87–5.11)
RBC: 2.77 MIL/uL — ABNORMAL LOW (ref 3.87–5.11)
RDW: 17.1 % — ABNORMAL HIGH (ref 11.5–15.5)
RDW: 16.5 % — ABNORMAL HIGH (ref 11.5–15.5)
WBC: 9.2 10*3/uL (ref 4.0–10.5)
WBC: 10.5 10*3/uL (ref 4.0–10.5)
nRBC: 0 % (ref 0.0–0.2)
nRBC: 0 % (ref 0.0–0.2)

## 2018-01-26 LAB — PREPARE RBC (CROSSMATCH)

## 2018-01-26 LAB — BASIC METABOLIC PANEL
ANION GAP: 6 (ref 5–15)
BUN: 31 mg/dL — AB (ref 8–23)
CO2: 18 mmol/L — ABNORMAL LOW (ref 22–32)
Calcium: 7.5 mg/dL — ABNORMAL LOW (ref 8.9–10.3)
Chloride: 109 mmol/L (ref 98–111)
Creatinine, Ser: 1.49 mg/dL — ABNORMAL HIGH (ref 0.44–1.00)
GFR calc Af Amer: 37 mL/min — ABNORMAL LOW (ref >60)
GFR calc non Af Amer: 32 mL/min — ABNORMAL LOW (ref >60)
Glucose, Bld: 130 mg/dL — ABNORMAL HIGH (ref 70–99)
Potassium: 4.7 mmol/L (ref 3.5–5.1)
SODIUM: 133 mmol/L — AB (ref 135–145)

## 2018-01-26 MED ORDER — FUROSEMIDE 10 MG/ML IJ SOLN
20.0000 mg | Freq: Once | INTRAMUSCULAR | Status: AC
  Administered 2018-01-26: 20 mg via INTRAVENOUS
  Filled 2018-01-26: qty 2

## 2018-01-26 MED ORDER — SODIUM CHLORIDE 0.9% IV SOLUTION
Freq: Once | INTRAVENOUS | Status: AC
  Administered 2018-01-26: 06:00:00 via INTRAVENOUS

## 2018-01-26 MED ORDER — SENNA 8.6 MG PO TABS
2.0000 | ORAL_TABLET | Freq: Once | ORAL | Status: AC
  Administered 2018-01-26: 17.2 mg via ORAL
  Filled 2018-01-26: qty 2

## 2018-01-26 MED ORDER — SODIUM CHLORIDE 0.9% IV SOLUTION: Freq: Once | INTRAVENOUS | Status: DC

## 2018-01-26 NOTE — Progress Notes (Signed)
NAME:  [name], MRN:  [id number], DOB:  03-Sep-1930, LOS: 2 ADMISSION DATE:  01/24/2018, CONSULTATION DATE:  01/24/2018 REFERRING MD:  Ninfa Linden, CHIEF COMPLAINT:  Hypotension   Brief History   84 y/o female with right hip prosthesis who fell and broke her hip, after ORIF of R hip she was hypotensive requiring a phenylephrine infusion. Concern for adrenal insufficiency.  Anemia requiring transfusion.    Past Medical History  Lung cancer, sjogren's, RA, CKD, LBB, Fe def anemia, GERD, constipation  Significant Hospital Events   12/08 ORIF 12/09 1 unit PRBC.  Hypotension, stress dose steroids started  12/10 1 unit PRBC, Hgb 6.8  Consults:  PCCM  Procedures:  12/8 ORIF  Significant Diagnostic Tests:    Micro Data:    Antimicrobials:     Interim history/subjective:  Pt off neosynephrine.  Feels better.  Transfused 1 unit blood this am.  Repeat H/H pending.  Denies pain.  States she needs a diuretic and something to go to the bathroom.   Objective   Blood pressure (!) 153/79, pulse 77, temperature 97.6 F (36.4 C), temperature source Oral, resp. rate 19, height 5\' 2"  (1.575 m), weight 58.9 kg, SpO2 95 %.        Intake/Output Summary (Last 24 hours) at 01/26/2018 1207 Last data filed at 01/26/2018 0700 Gross per 24 hour  Intake 1602.4 ml  Output 775 ml  Net 827.4 ml   Filed Weights   01/24/18 0950 01/24/18 2100 01/26/18 0500  Weight: 47.6 kg 51.8 kg 58.9 kg    Examination: General: elderly female lying in bed in NAD HEENT: MM pink/moist, no jvd Neuro: AAOx4, speech clear, pleasant, MAE  CV: s1s2 rrr, no m/r/g PULM: even/non-labored, lungs bilaterally clear  RC:BULA, non-tender, bsx4 active  Extremities: warm/dry, 1-2+ generalized edema  Skin: no rashes or lesions  Resolved Hospital Problem list     Assessment & Plan:   Post op hypotension -no evidence of sepsis or massive bleeding, doubt cardiac event. Given frailty, baseline autoimmune disease and  heavy exposure to corticosteroids, suspected adrenal insufficiency with cortisol of 7.  P: Continue stress dose steroids  Discontinue IVF  Await follow up H/H  Chronic microcytic iron deficient Anemia -s/p 1 unit PRBC 12/9 -s/p 1 unit PRBC 12/10  P: Now CBC  Transfuse per ICU guidelines Will need outpatient IV iron  AKI - mild, suspect related to hypotension, anemia -volume overload on exam P: Lasix 20 mg IV x1  Trend BMP / urinary output Replace electrolytes as indicated Avoid nephrotoxic agents as able, ensure adequate renal perfusion  Constipation  P: Senokot, two tabs now  Colace BID  PRN miralax  Mobilize as able   Thrombocytopenia  -suspect reactive  P: Monitor  Best practice:  Diet: advance Pain/Anxiety/Delirium protocol (if indicated):n/a VAP protocol (if indicated): n/a DVT prophylaxis: SCD for now, when OK to use heparin/anticoagulation would use as she is high risk GI prophylaxis: n/a Glucose control: n/a Mobility: PT consult Code Status: full Family Communication: Patient and daughter updated at bedside on plan of care.  Disposition: SDU status, consider transfer to floor pending H/H review.   Labs   CBC: Recent Labs  Lab 01/24/18 1248 01/25/18 0315 01/26/18 0255  WBC 6.6 15.3* 9.2  NEUTROABS 5.2  --  7.7  HGB 8.9* 7.4* 6.8*  HCT 28.7* 23.9* 20.9*  MCV 93.2 94.5 91.7  PLT 189 119* 93*    Basic Metabolic Panel: Recent Labs  Lab 01/24/18 1248 01/25/18 0315 01/26/18 0255  NA 132* 136 133*  K 5.3* 5.1 4.7  CL 100 111 109  CO2 23 17* 18*  GLUCOSE 97 171* 130*  BUN 23 23 31*  CREATININE 0.97 1.26* 1.49*  CALCIUM 8.9 7.1* 7.5*   GFR: Estimated Creatinine Clearance: 21.8 mL/min (A) (by C-G formula based on SCr of 1.49 mg/dL (H)). Recent Labs  Lab 01/24/18 1248 01/25/18 0315 01/26/18 0255  WBC 6.6 15.3* 9.2    Liver Function Tests: No results for input(s): AST, ALT, ALKPHOS, BILITOT, PROT, ALBUMIN in the last 168 hours. No  results for input(s): LIPASE, AMYLASE in the last 168 hours. No results for input(s): AMMONIA in the last 168 hours.  ABG    Component Value Date/Time   PHART 7.408 06/08/2013 0448   PCO2ART 42.7 06/08/2013 0448   PO2ART 153.0 (H) 06/08/2013 0448   HCO3 26.4 (H) 06/08/2013 0448   TCO2 27.7 06/08/2013 0448   O2SAT 99.3 06/08/2013 0448     Coagulation Profile: No results for input(s): INR, PROTIME in the last 168 hours.  Cardiac Enzymes: No results for input(s): CKTOTAL, CKMB, CKMBINDEX, TROPONINI in the last 168 hours.  HbA1C: No results found for: HGBA1C  CBG: No results for input(s): GLUCAP in the last 168 hours.   Critical care time: 30 minutes    Noe Gens, NP-C [facility] Pgr: [phone] or if no answer [phone] 01/26/2018, 12:07 PM

## 2018-01-26 NOTE — Progress Notes (Signed)
Patient ID: [name], female   DOB: 04/15/1930, 84 y.o.   MRN: [id number] Ms. Sansoucie looks great overall.  She did need one unit of blood this am.  Her vitals have been stable.  Dr. Lake Bells was able to explain the rational behind limiting the transfusion amount due to a possibility of renal insufficiency driving her increased creatinine rather than the acute anemia being a hit on her system.  I did speak with the patient about the need for short-term skilled nursing following this hospitalization since she had been having falls and balance issues.  She can be transferred to a floor bed if deemed stable.  Truly appreciate Critical Cares help with this patient.

## 2018-01-26 NOTE — Care Management Note (Signed)
Case Management Note  Patient Details  Name: [name] MRN: [id number] Date of Birth: [birth year]  Subjective/Objective:                  Rt orif hip with post op bleeding and anemia Getting two unit of prbc's today [id number] Action/Plan: Will follow for progression of care and clinical status. Will follow for case management needs none present at this time.  Expected Discharge Date:  (unknown)               Expected Discharge Plan:  [place]  In-House Referral:     Discharge planning Services  CM Consult  Post Acute Care Choice:    Choice offered to:     DME Arranged:    DME Agency:     HH Arranged:    HH Agency:     Status of Service:  In process, will continue to follow  If discussed at Long Length of Stay Meetings, dates discussed:    Additional Comments:  Leeroy Cha, RN 01/26/2018, 8:53 AM

## 2018-01-26 NOTE — Progress Notes (Signed)
eLink Physician-Brief Progress Note Patient Name: [name] DOB: 20-Oct-1930 MRN: [id number]   Date of Service  01/26/2018  HPI/Events of Note  Anemia - Hgb = 6.8.   eICU Interventions  Will transfuse 1 unit PRBC now.      Intervention Category Major Interventions: Other:  Sommer,Steven Cornelia Copa 01/26/2018, 4:36 AM

## 2018-01-26 NOTE — Progress Notes (Signed)
OT Cancellation Note  Patient Details Name: [name] MRN: [id number] DOB: 09/24/1930   Cancelled Treatment:    Reason Eval/Treat Not Completed: Medical issues which prohibited therapy.  Pt's Hgb was 6.8 with plan to get blood.  Did not have time to check back this pm. Will return tomorrow  Evelyna Folker 01/26/2018, 3:21 PM  Lesle Chris, OTR/L Acute Rehabilitation Services [phone] [facility] pager [phone] office 01/26/2018

## 2018-01-26 NOTE — Progress Notes (Signed)
PT Cancellation Note  Patient Details Name: [name] MRN: [id number] DOB: March 12, 1930   Cancelled Treatment:     HgB 6.8 and elevated creatinine.  Pt schedule to receive 2 units today.  Will attempt to see next day.    Rica Koyanagi  PTA Acute  Rehabilitation Services Pager      [phone] Office      [phone]

## 2018-01-26 NOTE — Progress Notes (Signed)
Patient ID: [name], female   DOB: 03-Apr-1930, 84 y.o.   MRN: [id number] I feel that the patient needs an additional unit of blood.  I recommended 2 units yesterday, but was over ridden by the Critical Care Specialist, which I am fine with.  However, she has had a significant drop in hgb and a rise in her creatinine and would benefits from that additional unit of blood.  Her surgery was extensive and she will likely slowly ooze more.

## 2018-01-27 LAB — CBC
HCT: 24.7 % — ABNORMAL LOW (ref 36.0–46.0)
Hemoglobin: 8.1 g/dL — ABNORMAL LOW (ref 12.0–15.0)
MCH: 29.1 pg (ref 26.0–34.0)
MCHC: 32.8 g/dL (ref 30.0–36.0)
MCV: 88.8 fL (ref 80.0–100.0)
Platelets: 101 10*3/uL — ABNORMAL LOW (ref 150–400)
RBC: 2.78 MIL/uL — ABNORMAL LOW (ref 3.87–5.11)
RDW: 16.4 % — AB (ref 11.5–15.5)
WBC: 9.3 10*3/uL (ref 4.0–10.5)
nRBC: 0 % (ref 0.0–0.2)

## 2018-01-27 MED ORDER — HYDROCORTISONE NA SUCCINATE PF 100 MG IJ SOLR
25.0000 mg | Freq: Four times a day (QID) | INTRAMUSCULAR | Status: DC
  Administered 2018-01-27 – 2018-01-28 (×4): 25 mg via INTRAVENOUS
  Filled 2018-01-27 (×4): qty 2

## 2018-01-27 NOTE — Plan of Care (Signed)
  Problem: Clinical Measurements: Goal: Diagnostic test results will improve Outcome: Progressing Goal: Respiratory complications will improve Outcome: Progressing Goal: Cardiovascular complication will be avoided Outcome: Progressing   

## 2018-01-27 NOTE — Progress Notes (Signed)
PROGRESS NOTE    [name]  SEG:[id number] DOB: [birth year] DOA: 01/24/2018 PCP: Darreld Mclean, MD   Brief Narrative:  84 year old with past medical history relevant for Sjogren's syndrome, lupus, hypothyroidism, chronic pain, erythromelalgia, non-small cell lung cancer status post resection, osteoporosis, osteoarthritis resented to the ED on 01/24/2018 after mechanical fall and found to have right hip dislocation with multiple failed attempts to relocate status post open reduction of hip with revision of lining and bowel on 01/24/2018 with postoperative course complicated by hypotension attributed to acute blood loss anemia as well as adrenal insufficiency.   Assessment & Plan:   Principal Problem:   Unstable right hip arthroplasty Active Problems:   Status post revision of total hip   History of revision of total replacement of right hip joint   Hypovolemic shock (HCC)   Hyperkalemia   Hyponatremia   #) Hypotension: This occurred postoperatively.  Patient was on phenylephrine drip for approximately 1.5 days.  She did require blood transfusions for postoperative anemia as well as was started on high-dose steroids for presumed adrenal insufficiency.  She is now hypertensive. -Wean hydrocortisone to 25 units every 6 hours -See plan below for postoperative anemia  #) Acute blood loss anemia: Patient is status post 4 units packed red blood cells postoperatively. -Hemoglobin stable  #) Status post right hip arthroplasty revision: -Social work consult for placement -Orthopedic surgery following, appreciate recommendations - Postoperative pain control defer to orthopedic surgery -Continue aspirin for prophylaxis  #) Hypothyroidism: - Continue levothyroxine 88 mcg  #) Osteoarthritis/osteoporosis: - Continue vitamin D and calcium  #) Pain/psych: -Continue gabapentin 300 mg every morning, 300 mg every afternoon, 600 mg nightly  #) Sjogren's syndrome/lupus: Patient  apparently is not on any treatment for this.  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 Electrolytes: Monitor and supplement Nutrition: Regular diet  Prophylaxis: Aspirin  Disposition: Pending PT evaluation and likely skilled nursing facility placement  Full code   Consultants:   Orthopedic surgery  PCCM  Procedures:   01/24/2018:OPEN REDUCTION OF DISLOCATED ANTERIOR HIP WITH REVISION OF LINER AND HIP BALL   Antimicrobials:   Perioperative antibiotics   Subjective: This morning patient reports she is feeling well.  She reports only some pain at her incision site.  She denies any nausea, vomiting, diarrhea, cough, congestion, rhinorrhea.  Her family is quite concerned about the elevated blood pressures now.  Objective: Vitals:   01/27/18 0400 01/27/18 0500 01/27/18 0600 01/27/18 0800  BP: (!) 171/58 (!) 151/76 (!) 164/61 (!) 169/62  Pulse: (!) 55 70 63 63  Resp: 15 19 16 20   Temp: 97.7 F (36.5 C)   97.6 F (36.4 C)  TempSrc: Oral   Oral  SpO2: 95% 93% 95% 97%  Weight:  56.9 kg    Height:        Intake/Output Summary (Last 24 hours) at 01/27/2018 1011 Last data filed at 01/27/2018 0849 Gross per 24 hour  Intake 340 ml  Output 1525 ml  Net -1185 ml   Filed Weights   01/24/18 2100 01/26/18 0500 01/27/18 0500  Weight: 51.8 kg 58.9 kg 56.9 kg    Examination:  General exam: Appears calm and comfortable  Respiratory system: Clear to auscultation. Respiratory effort normal. Cardiovascular system: Regular rate and rhythm, no murmurs Gastrointestinal system: Abdomen is nondistended, soft and nontender. No organomegaly or masses felt. Normal bowel sounds heard. Central nervous system: Alert and oriented.  Grossly intact, moving all extremities Extremities: Trace  lower extremity edema, distal pulses in right lower extremity intact Skin: Incision site is clean dry and intact Psychiatry: Judgement and insight appear normal. Mood  & affect appropriate.     Data Reviewed: I have personally reviewed following labs and imaging studies  CBC: Recent Labs  Lab 01/24/18 1248 01/25/18 0315 01/26/18 0255 01/26/18 1257 01/27/18 0259  WBC 6.6 15.3* 9.2 10.5 9.3  NEUTROABS 5.2  --  7.7 9.1*  --   HGB 8.9* 7.4* 6.8* 8.4* 8.1*  HCT 28.7* 23.9* 20.9* 25.5* 24.7*  MCV 93.2 94.5 91.7 92.1 88.8  PLT 189 119* 93* 103* 938*   Basic Metabolic Panel: Recent Labs  Lab 01/24/18 1248 01/25/18 0315 01/26/18 0255  NA 132* 136 133*  K 5.3* 5.1 4.7  CL 100 111 109  CO2 23 17* 18*  GLUCOSE 97 171* 130*  BUN 23 23 31*  CREATININE 0.97 1.26* 1.49*  CALCIUM 8.9 7.1* 7.5*   GFR: Estimated Creatinine Clearance: 21.8 mL/min (A) (by C-G formula based on SCr of 1.49 mg/dL (H)). Liver Function Tests: No results for input(s): AST, ALT, ALKPHOS, BILITOT, PROT, ALBUMIN in the last 168 hours. No results for input(s): LIPASE, AMYLASE in the last 168 hours. No results for input(s): AMMONIA in the last 168 hours. Coagulation Profile: No results for input(s): INR, PROTIME in the last 168 hours. Cardiac Enzymes: No results for input(s): CKTOTAL, CKMB, CKMBINDEX, TROPONINI in the last 168 hours. BNP (last 3 results) No results for input(s): PROBNP in the last 8760 hours. HbA1C: No results for input(s): HGBA1C in the last 72 hours. CBG: No results for input(s): GLUCAP in the last 168 hours. Lipid Profile: No results for input(s): CHOL, HDL, LDLCALC, TRIG, CHOLHDL, LDLDIRECT in the last 72 hours. Thyroid Function Tests: No results for input(s): TSH, T4TOTAL, FREET4, T3FREE, THYROIDAB in the last 72 hours. Anemia Panel: No results for input(s): VITAMINB12, FOLATE, FERRITIN, TIBC, IRON, RETICCTPCT in the last 72 hours. Sepsis Labs: No results for input(s): PROCALCITON, LATICACIDVEN in the last 168 hours.  Recent Results (from the past 240 hour(s))  MRSA PCR Screening     Status: Abnormal   Collection Time: 01/25/18  1:04 AM  Result  Value Ref Range Status   MRSA by PCR POSITIVE (A) NEGATIVE Final    Comment:        The GeneXpert MRSA Assay (FDA approved for NASAL specimens only), is one component of a comprehensive MRSA colonization surveillance program. It is not intended to diagnose MRSA infection nor to guide or monitor treatment for MRSA infections. RESULT CALLED TO, READ BACK BY AND VERIFIED WITH: JACKSON,K RN @ 0225 ON 01/25/18 JACKSON,K Performed at [facility], [place] [address], [place], [place] [address]          Radiology Studies: No results found.      Scheduled Meds: . sodium chloride   Intravenous Once  . aspirin  81 mg Oral BID  . calcium-vitamin D  1 tablet Oral Q breakfast  . Chlorhexidine Gluconate Cloth  6 each Topical Q0600  . docusate sodium  100 mg Oral BID  . gabapentin  300 mg Oral BID  . gabapentin  600 mg Oral QHS  . hydrocortisone sodium succinate  25 mg Intravenous Q6H  . levothyroxine  88 mcg Oral QHS  . mouth rinse  15 mL Mouth Rinse BID  . multivitamin with minerals  1 tablet Oral Daily  . mupirocin ointment  1 application Nasal BID  . pantoprazole  40 mg  Oral Daily   Continuous Infusions: . methocarbamol (ROBAXIN) IV       LOS: 3 days    Time spent: [place], MD [facility]  If 7PM-7AM, please contact night-coverage [email or web address] Password [email or web address] 01/27/2018, 10:11 AM

## 2018-01-27 NOTE — Progress Notes (Signed)
Patient ID: [name], female   DOB: 1930/06/15, 84 y.o.   MRN: [id number] No acute changes overnight.  She is hypertensive, but otherwise her vitals are stable.  Her right hip dressing is clean and dry.  I looked at her incision last evening and it was dry.  Her hgb early this am is stable at 8.1.  From Ortho standpoint, I am fine with her being transferred to a regular floor bed. She does need guidance from Medicine as too blood pressure control.  PT/OT is needed.  I did order a Social Work consult for short-term skilled nursing following her hospital stay.

## 2018-01-27 NOTE — Progress Notes (Signed)
OT Cancellation Note  Patient Details Name: [name] MRN: [id number] DOB: 31-May-1930   Cancelled Treatment:    Reason Eval/Treat Not Completed: Medical issues which prohibited therapy.  Initiated eval but RN doing EKG.  Will likely return tomorrow  [place] 01/27/2018, 3:12 PM  Lesle Chris, OTR/L Acute Rehabilitation Services [phone] [facility] pager [phone] office 01/27/2018

## 2018-01-28 LAB — CBC
HCT: 23.5 % — ABNORMAL LOW (ref 36.0–46.0)
Hemoglobin: 7.6 g/dL — ABNORMAL LOW (ref 12.0–15.0)
MCH: 29.5 pg (ref 26.0–34.0)
MCHC: 32.3 g/dL (ref 30.0–36.0)
MCV: 91.1 fL (ref 80.0–100.0)
Platelets: 134 10*3/uL — ABNORMAL LOW (ref 150–400)
RBC: 2.58 MIL/uL — ABNORMAL LOW (ref 3.87–5.11)
RDW: 16.8 % — ABNORMAL HIGH (ref 11.5–15.5)
WBC: 9.2 10*3/uL (ref 4.0–10.5)
nRBC: 0 % (ref 0.0–0.2)

## 2018-01-28 LAB — BASIC METABOLIC PANEL
Anion gap: 6 (ref 5–15)
BUN: 31 mg/dL — ABNORMAL HIGH (ref 8–23)
CO2: 23 mmol/L (ref 22–32)
Calcium: 8.1 mg/dL — ABNORMAL LOW (ref 8.9–10.3)
Chloride: 105 mmol/L (ref 98–111)
Creatinine, Ser: 0.98 mg/dL (ref 0.44–1.00)
GFR calc Af Amer: >60 mL/min (ref >60)
GFR calc non Af Amer: 53 mL/min — ABNORMAL LOW (ref >60)
Glucose, Bld: 104 mg/dL — ABNORMAL HIGH (ref 70–99)
Potassium: 3.7 mmol/L (ref 3.5–5.1)
Sodium: 134 mmol/L — ABNORMAL LOW (ref 135–145)

## 2018-01-28 LAB — TYPE AND SCREEN
ABO/RH(D): A POS
Antibody Screen: NEGATIVE
Unit division: 0
Unit division: 0
Unit division: 0
Unit division: 0
Unit division: 0

## 2018-01-28 LAB — BPAM RBC
Blood Product Expiration Date: 201912252359
Blood Product Expiration Date: 201912272359
Blood Product Expiration Date: 201912272359
Blood Product Expiration Date: 201912292359
Blood Product Expiration Date: 201912302359
ISSUE DATE / TIME: 201912081601
ISSUE DATE / TIME: 201912081719
ISSUE DATE / TIME: 201912090916
ISSUE DATE / TIME: 201912100452
UNIT TYPE AND RH: 6200
Unit Type and Rh: 6200
Unit Type and Rh: 6200
Unit Type and Rh: 6200
Unit Type and Rh: 6200

## 2018-01-28 LAB — MAGNESIUM: Magnesium: 2.1 mg/dL (ref 1.7–2.4)

## 2018-01-28 MED ORDER — HYDROCORTISONE NA SUCCINATE PF 100 MG IJ SOLR
25.0000 mg | Freq: Two times a day (BID) | INTRAMUSCULAR | Status: DC
  Administered 2018-01-28 – 2018-01-29 (×2): 25 mg via INTRAVENOUS
  Filled 2018-01-28 (×2): qty 2

## 2018-01-28 NOTE — Evaluation (Signed)
Occupational Therapy Evaluation Patient Details Name: [name] MRN: [id number] DOB: [birth year] Today's Date: 01/28/2018    History of Present Illness 84 y.o. female admitted with fall, dislocation of R anterior THA. s/p open reduction and revision of liner and ball. Pt had post op hypotension.  PMH: R DA-THA 2016, R hip revision 11/27/17, RA, lung cancer Sjorgrens syndrome.    Clinical Impression   Pt was admitted for the above.  At baseline, she has assist for showering from caregiver but functions most of the day on her own. Will focusing on toileting and bed mobility in preparation for toileting so that she can work towards being on her own for most of the day    Follow Up Recommendations  SNF;Supervision/Assistance - 24 hour    Equipment Recommendations  None recommended by OT    Recommendations for Other Services       Precautions / Restrictions Precautions Precautions: Fall Precaution Comments: No R hip ABDuction Restrictions Weight Bearing Restrictions: Yes RLE Weight Bearing: Weight bearing as tolerated      Mobility Bed Mobility         Supine to sit: Mod assist     General bed mobility comments: assist for legs and trunk  Transfers   Equipment used: Rolling walker (2 wheeled)   Sit to Stand: Min assist;From elevated surface         General transfer comment: pt self cues UE/LE placement    Balance                                           ADL either performed or assessed with clinical judgement   ADL Overall ADL's : Needs assistance/impaired Eating/Feeding: Independent   Grooming: Set up   Upper Body Bathing: Set up   Lower Body Bathing: Moderate assistance   Upper Body Dressing : Set up   Lower Body Dressing: Maximal assistance   Toilet Transfer: Minimal assistance;Ambulation;Stand-pivot;BSC;RW   Toileting- Clothing Manipulation and Hygiene: Moderate assistance         General ADL Comments: ambulated in  hall, used [facility] and sat up in chair. Pt very anxiuos to be OOB     Vision         Perception     Praxis      Pertinent Vitals/Pain Pain Score: 4  Pain Location: R hip Pain Descriptors / Indicators: Sore Pain Intervention(s): Limited activity within patient's tolerance;Monitored during session;Repositioned     Hand Dominance     Extremity/Trunk Assessment Upper Extremity Assessment Upper Extremity Assessment: LUE deficits/detail(arthritic changes; decreased bil shoulder mvt esp L)           Communication Communication Communication: No difficulties   Cognition Arousal/Alertness: Awake/alert Behavior During Therapy: WFL for tasks assessed/performed Overall Cognitive Status: Within Functional Limits for tasks assessed                                     General Comments       Exercises     Shoulder Instructions      Home Living Family/patient expects to be discharged to:: Unsure Living Arrangements: Alone                               Additional Comments: pt  had caregiver during the day, was alone at night; daughter available to help some      Prior Functioning/Environment Level of Independence: Independent with assistive device(s)        Comments: used 4WW prior to admission for ambulation as needed   Caregiver helped with shower.  There 4-6 hrs a day        OT Problem List: Decreased activity tolerance;Impaired balance (sitting and/or standing);Pain;Cardiopulmonary status limiting activity      OT Treatment/Interventions: Self-care/ADL training;DME and/or AE instruction;Patient/family education;Balance training;Therapeutic activities    OT Goals(Current goals can be found in the care plan section) Acute Rehab OT Goals Patient Stated Goal: to DC home OT Goal Formulation: With patient Time For Goal Achievement: 02/11/18 Potential to Achieve Goals: Good ADL Goals Pt Will Transfer to Toilet: with  supervision;ambulating;bedside commode Pt Will Perform Toileting - Clothing Manipulation and hygiene: with supervision;sit to/from stand Additional ADL Goal #1: pt will perform bed mobility at supervision level in preparation for toileting  OT Frequency: Min 2X/week   Barriers to D/C:            Co-evaluation PT/OT/SLP Co-Evaluation/Treatment: Yes Reason for Co-Treatment: For patient/therapist safety PT goals addressed during session: Mobility/safety with mobility OT goals addressed during session: ADL's and self-care      AM-PAC OT "6 Clicks" Daily Activity     Outcome Measure Help from another person eating meals?: None Help from another person taking care of personal grooming?: A Little Help from another person toileting, which includes using toliet, bedpan, or urinal?: A Lot Help from another person bathing (including washing, rinsing, drying)?: A Lot Help from another person to put on and taking off regular upper body clothing?: A Little Help from another person to put on and taking off regular lower body clothing?: A Lot 6 Click Score: 16   End of Session    Activity Tolerance: Patient tolerated treatment well Patient left: in chair;with call bell/phone within reach;with family/visitor present  OT Visit Diagnosis: Muscle weakness (generalized) (M62.81)                Time: 3149-7026 OT Time Calculation (min): 27 min Charges:  OT General Charges $OT Visit: 1 Visit OT Evaluation $OT Eval Low Complexity: 1 Low  Lesle Chris, OTR/L Acute Rehabilitation Services [phone] [facility] pager [phone] office 01/28/2018  [place] 01/28/2018, 10:12 AM

## 2018-01-28 NOTE — Care Management Note (Signed)
Case Management Note  Patient Details  Name: [name] MRN: [id number] Date of Birth: September 25, 1930  Subjective/Objective:                   01/24/2018:OPEN REDUCTION OF DISLOCATED ANTERIOR HIP WITH REVISION OF LINER AND HIP BALL   Action/Plan: Will follow for progression of care and clinical status. Will follow for case management needs none present at this time.  Expected Discharge Date:  (unknown)               Expected Discharge Plan:  [place]  In-House Referral:     Discharge planning Services  CM Consult  Post Acute Care Choice:    Choice offered to:     DME Arranged:    DME Agency:     HH Arranged:    HH Agency:     Status of Service:  In process, will continue to follow  If discussed at Long Length of Stay Meetings, dates discussed:    Additional Comments:  Leeroy Cha, RN 01/28/2018, 8:09 AM

## 2018-01-28 NOTE — Progress Notes (Signed)
Physical Therapy Treatment Patient Details Name: [name] MRN: [id number] DOB: October 28, 1930 Today's Date: 01/28/2018    History of Present Illness 84 y.o. female admitted with fall, dislocation of R anterior THA. s/p open reduction and revision of liner and ball. Pt had post op hypotension.  PMH: R DA-THA 2016, R hip revision 11/27/17, RA, lung cancer Sjorgrens syndrome.     PT Comments    *The patient is very motivated to ambyulate, x 120'. HR 101, Min pain of right hip per patient.  Continue PT  Follow Up Recommendations  Home health PT vs SNF- has caregivers already 4 hours /day and daughter may be available for home.     Equipment Recommendations  None recommended by PT    Recommendations for Other Services       Precautions / Restrictions Precautions Precautions: Fall Precaution Comments: No R hip ABDuction Restrictions Weight Bearing Restrictions: Yes RLE Weight Bearing: Weight bearing as tolerated    Mobility  Bed Mobility         Supine to sit: Mod assist     General bed mobility comments: assist for legs and trunk  Transfers   Equipment used: Rolling walker (2 wheeled)   Sit to Stand: Min assist;From elevated surface         General transfer comment: pt self cues UE/LE placement  Ambulation/Gait Ambulation/Gait assistance: Min guard;+2 safety/equipment Gait Distance (Feet): 120 Feet Assistive device: Rolling walker (2 wheeled) Gait Pattern/deviations: Step-through pattern     General Gait Details: HR  indicated Vtach 101, stood and rested and then able to procede withoutany further indications   Marine scientist Rankin (Stroke Patients Only)       Balance                                            Cognition Arousal/Alertness: Awake/alert Behavior During Therapy: WFL for tasks assessed/performed Overall Cognitive Status: Within Functional Limits for tasks assessed                                         Exercises      General Comments        Pertinent Vitals/Pain Pain Score: 4  Pain Location: R hip Pain Descriptors / Indicators: Sore Pain Intervention(s): Monitored during session;Repositioned    Home Living Family/patient expects to be discharged to:: Unsure Living Arrangements: Alone             Additional Comments: pt had caregiver during the day, was alone at night; daughter available to help some    Prior Function Level of Independence: Independent with assistive device(s)      Comments: used 4WW prior to admission for ambulation as needed   Caregiver helped with shower.  There 4-6 hrs a day   PT Goals (current goals can now be found in the care plan section) Acute Rehab PT Goals Patient Stated Goal: to DC home Progress towards PT goals: Progressing toward goals    Frequency    Min 3X/week      PT Plan Current plan remains appropriate;Frequency needs to be updated    Co-evaluation PT/OT/SLP Co-Evaluation/Treatment: Yes Reason for Co-Treatment: For patient/therapist safety PT goals addressed  during session: Mobility/safety with mobility OT goals addressed during session: ADL's and self-care      AM-PAC PT "6 Clicks" Mobility   Outcome Measure  Help needed turning from your back to your side while in a flat bed without using bedrails?: A Lot Help needed moving from lying on your back to sitting on the side of a flat bed without using bedrails?: A Lot Help needed moving to and from a bed to a chair (including a wheelchair)?: A Lot Help needed standing up from a chair using your arms (e.g., wheelchair or bedside chair)?: A Little Help needed to walk in hospital room?: A Little Help needed climbing 3-5 steps with a railing? : Total 6 Click Score: 13    End of Session Equipment Utilized During Treatment: Gait belt Activity Tolerance: Patient tolerated treatment well Patient left: in chair(with  OT) Nurse Communication: Mobility status PT Visit Diagnosis: Difficulty in walking, not elsewhere classified (R26.2);Pain Pain - Right/Left: Right Pain - part of body: Hip     Time: 5947-0761 PT Time Calculation (min) (ACUTE ONLY): 26 min  Charges:  $Gait Training: 8-22 mins                     [place] Pager [phone] Office [phone]    [name] 01/28/2018, 10:20 AM

## 2018-01-28 NOTE — Progress Notes (Signed)
Patient ID: [name], female   DOB: 05/14/1930, 84 y.o.   MRN: [id number] Doing well overall.  Has been up with therapy.  Awaiting a regular floor bed to open up.  Hgb down below 8, but creatinine back to normal.  Still hypertensive.

## 2018-01-28 NOTE — Progress Notes (Signed)
LCSW consulted for SNF placement.   Patient going home with home health. LCSW signing off. No CSW needs.   Please submit new CSW consult if need arise.   Carolin Coy [place]

## 2018-01-28 NOTE — Progress Notes (Signed)
PROGRESS NOTE    [name]  JJK:[id number] DOB: 1930-04-04 DOA: 01/24/2018 PCP: Darreld Mclean, MD   Brief Narrative:  84 year old with past medical history relevant for Sjogren's syndrome, lupus, hypothyroidism, chronic pain, erythromelalgia, non-small cell lung cancer status post resection, osteoporosis, osteoarthritis resented to the ED on 01/24/2018 after mechanical fall and found to have right hip dislocation with multiple failed attempts to relocate status post open reduction of hip with revision of lining and bowel on 01/24/2018 with postoperative course complicated by hypotension attributed to acute blood loss anemia as well as adrenal insufficiency.   Assessment & Plan:   Principal Problem:   Unstable right hip arthroplasty Active Problems:   Status post revision of total hip   History of revision of total replacement of right hip joint   Hypovolemic shock (HCC)   Hyperkalemia   Hyponatremia   #) Hypotension secondary to adrenal insufficiency: Resolved with starting steroids -Wean hydrocortisone to 25 units every 12 hours -See plan below for postoperative anemia  #) Acute blood loss anemia: Patient is status post 4 units packed red blood cells postoperatively. -Hemoglobin stable but slightly downtrending  #) Status post right hip arthroplasty revision: -Social work consult for placement -Orthopedic surgery following, appreciate recommendations - Postoperative pain control defer to orthopedic surgery -Continue aspirin for prophylaxis  #) Hypothyroidism: - Continue levothyroxine 88 mcg  #) Osteoarthritis/osteoporosis: - Continue vitamin D and calcium  #) Pain/psych: -Continue gabapentin 300 mg every morning, 300 mg every afternoon, 600 mg nightly  #) Sjogren's syndrome/lupus: Patient apparently is not on any treatment for this.  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 Electrolytes: Monitor and supplement Nutrition:  Regular diet  Prophylaxis: Aspirin  Disposition: Pending PT evaluation and likely skilled nursing facility placement  Full code   Consultants:   Orthopedic surgery  PCCM  Procedures:   01/24/2018:OPEN REDUCTION OF DISLOCATED ANTERIOR HIP WITH REVISION OF LINER AND HIP BALL   Antimicrobials:   Perioperative antibiotics   Subjective: This morning patient reports she is feeling well.  She denies any nausea, vomiting, diarrhea, cough, congestion, rhinorrhea.  She was transferred out of the ICU however there are no beds available on the floor.  Objective: Vitals:   01/28/18 0600 01/28/18 0700 01/28/18 0800 01/28/18 0833  BP: (!) 162/60   (!) 145/64  Pulse: 66 65 66 80  Resp: 17 20 (!) 21 18  Temp:   97.8 F (36.6 C)   TempSrc:   Axillary   SpO2: 97% 96% 99% 95%  Weight:      Height:        Intake/Output Summary (Last 24 hours) at 01/28/2018 0940 Last data filed at 01/28/2018 0500 Gross per 24 hour  Intake -  Output 1350 ml  Net -1350 ml   Filed Weights   01/26/18 0500 01/27/18 0500 01/28/18 0500  Weight: 58.9 kg 56.9 kg 56.5 kg    Examination:  General exam: Appears calm and comfortable  Respiratory system: Clear to auscultation. Respiratory effort normal. Cardiovascular system: Regular rate and rhythm, no murmurs Gastrointestinal system: Abdomen is nondistended, soft and nontender. No organomegaly or masses felt. Normal bowel sounds heard. Central nervous system: Alert and oriented.  Grossly intact, moving all extremities Extremities: Trace lower extremity edema, distal pulses in right lower extremity intact Skin: Incision site is clean dry and intact Psychiatry: Judgement and insight appear normal. Mood & affect appropriate.     Data Reviewed: I have personally reviewed following labs  and imaging studies  CBC: Recent Labs  Lab 01/24/18 1248 01/25/18 0315 01/26/18 0255 01/26/18 1257 01/27/18 0259 01/28/18 0258  WBC 6.6 15.3* 9.2 10.5 9.3 9.2    NEUTROABS 5.2  --  7.7 9.1*  --   --   HGB 8.9* 7.4* 6.8* 8.4* 8.1* 7.6*  HCT 28.7* 23.9* 20.9* 25.5* 24.7* 23.5*  MCV 93.2 94.5 91.7 92.1 88.8 91.1  PLT 189 119* 93* 103* 101* 229*   Basic Metabolic Panel: Recent Labs  Lab 01/24/18 1248 01/25/18 0315 01/26/18 0255 01/28/18 0258  NA 132* 136 133* 134*  K 5.3* 5.1 4.7 3.7  CL 100 111 109 105  CO2 23 17* 18* 23  GLUCOSE 97 171* 130* 104*  BUN 23 23 31* 31*  CREATININE 0.97 1.26* 1.49* 0.98  CALCIUM 8.9 7.1* 7.5* 8.1*  MG  --   --   --  2.1   GFR: Estimated Creatinine Clearance: 33.2 mL/min (by C-G formula based on SCr of 0.98 mg/dL). Liver Function Tests: No results for input(s): AST, ALT, ALKPHOS, BILITOT, PROT, ALBUMIN in the last 168 hours. No results for input(s): LIPASE, AMYLASE in the last 168 hours. No results for input(s): AMMONIA in the last 168 hours. Coagulation Profile: No results for input(s): INR, PROTIME in the last 168 hours. Cardiac Enzymes: No results for input(s): CKTOTAL, CKMB, CKMBINDEX, TROPONINI in the last 168 hours. BNP (last 3 results) No results for input(s): PROBNP in the last 8760 hours. HbA1C: No results for input(s): HGBA1C in the last 72 hours. CBG: No results for input(s): GLUCAP in the last 168 hours. Lipid Profile: No results for input(s): CHOL, HDL, LDLCALC, TRIG, CHOLHDL, LDLDIRECT in the last 72 hours. Thyroid Function Tests: No results for input(s): TSH, T4TOTAL, FREET4, T3FREE, THYROIDAB in the last 72 hours. Anemia Panel: No results for input(s): VITAMINB12, FOLATE, FERRITIN, TIBC, IRON, RETICCTPCT in the last 72 hours. Sepsis Labs: No results for input(s): PROCALCITON, LATICACIDVEN in the last 168 hours.  Recent Results (from the past 240 hour(s))  MRSA PCR Screening     Status: Abnormal   Collection Time: 01/25/18  1:04 AM  Result Value Ref Range Status   MRSA by PCR POSITIVE (A) NEGATIVE Final    Comment:        The GeneXpert MRSA Assay (FDA approved for NASAL  specimens only), is one component of a comprehensive MRSA colonization surveillance program. It is not intended to diagnose MRSA infection nor to guide or monitor treatment for MRSA infections. RESULT CALLED TO, READ BACK BY AND VERIFIED WITH: JACKSON,K RN @ 0225 ON 01/25/18 JACKSON,K Performed at [facility], [place] [address], [place], [place] [address]          Radiology Studies: No results found.      Scheduled Meds: . sodium chloride   Intravenous Once  . aspirin  81 mg Oral BID  . calcium-vitamin D  1 tablet Oral Q breakfast  . Chlorhexidine Gluconate Cloth  6 each Topical Q0600  . docusate sodium  100 mg Oral BID  . gabapentin  300 mg Oral BID  . gabapentin  600 mg Oral QHS  . hydrocortisone sodium succinate  25 mg Intravenous Q12H  . levothyroxine  88 mcg Oral QHS  . mouth rinse  15 mL Mouth Rinse BID  . multivitamin with minerals  1 tablet Oral Daily  . mupirocin ointment  1 application Nasal BID  . pantoprazole  40 mg Oral Daily   Continuous Infusions: . methocarbamol (ROBAXIN)  IV       LOS: 4 days    Time spent: [place], MD [facility]  If 7PM-7AM, please contact night-coverage [email or web address] Password [email or web address] 01/28/2018, 9:40 AM

## 2018-01-29 LAB — CBC
HCT: 23.9 % — ABNORMAL LOW (ref 36.0–46.0)
Hemoglobin: 7.6 g/dL — ABNORMAL LOW (ref 12.0–15.0)
MCH: 29.8 pg (ref 26.0–34.0)
MCHC: 31.8 g/dL (ref 30.0–36.0)
MCV: 93.7 fL (ref 80.0–100.0)
Platelets: 161 10*3/uL (ref 150–400)
RBC: 2.55 MIL/uL — ABNORMAL LOW (ref 3.87–5.11)
RDW: 17.1 % — ABNORMAL HIGH (ref 11.5–15.5)
WBC: 7.1 10*3/uL (ref 4.0–10.5)
nRBC: 0 % (ref 0.0–0.2)

## 2018-01-29 MED ORDER — HYDROCORTISONE 10 MG PO TABS
10.0000 mg | ORAL_TABLET | Freq: Two times a day (BID) | ORAL | Status: DC
  Administered 2018-01-29 (×2): 10 mg via ORAL
  Filled 2018-01-29 (×4): qty 1

## 2018-01-29 NOTE — Progress Notes (Addendum)
Physical Therapy Treatment Patient Details Name: [name] MRN: [id number] DOB: [birth year] Today's Date: 01/29/2018    History of Present Illness 84 y.o. female admitted with fall, dislocation of R anterior THA. s/p open reduction and revision of liner and ball. Pt had post op hypotension.  PMH: R DA-THA 2016, R hip revision 11/27/17, RA, lung cancer Sjorgrens syndrome.     PT Comments    The patient ambulated x 130. HR 123. Patient progressing well. Plans Home with HHPT and caregivers.   Follow Up Recommendations  Home health PT     Equipment Recommendations  None recommended by PT    Recommendations for Other Services       Precautions / Restrictions Precautions Precautions: Fall Precaution Comments: No R hip ABDuction Restrictions RLE Weight Bearing: Weight bearing as tolerated    Mobility  [name] PT Acute Rehabilitation Services Pager [phone] Office [phone]  Transfers   Equipment used: Rolling walker (2 wheeled) Transfers: Sit to/from Stand Sit to Stand: Min assist;From elevated surface Stand pivot transfers: Min assist       General transfer comment: pt self cues UE/LE  Ambulation/Gait Ambulation/Gait assistance: Herbalist (Feet): 130 Feet Assistive device: Rolling walker (2 wheeled) Gait Pattern/deviations: Step-through pattern;Step-to pattern     General Gait Details: HR 123, gait is smoothe, no loss of balance   Stairs             Wheelchair Mobility    Modified Rankin (Stroke Patients Only)       Balance                                            Cognition Arousal/Alertness: Awake/alert                                            Exercises      General Comments        Pertinent Vitals/Pain Pain Score: 2  Pain Location: R hip Pain Descriptors / Indicators: Sore Pain Intervention(s): Monitored during session;Premedicated before session    Home  Living                      Prior Function            PT Goals (current goals can now be found in the care plan section) Progress towards PT goals: Progressing toward goals    Frequency    Min 3X/week      PT Plan Current plan remains appropriate    Co-evaluation              AM-PAC PT "6 Clicks" Mobility   Outcome Measure  Help needed turning from your back to your side while in a flat bed without using bedrails?: A Lot Help needed moving from lying on your back to sitting on the side of a flat bed without using bedrails?: A Lot Help needed moving to and from a bed to a chair (including a wheelchair)?: A Lot Help needed standing up from a chair using your arms (e.g., wheelchair or bedside chair)?: A Little Help needed to walk in hospital room?: A Little Help needed climbing 3-5 steps with a railing? : Total 6 Click Score: 13    End of Session  Equipment Utilized During Treatment: Gait belt Activity Tolerance: Patient tolerated treatment well Patient left: in chair;with call bell/phone within reach;with family/visitor present Nurse Communication: Mobility status PT Visit Diagnosis: Unsteadiness on feet (R26.81) Pain - Right/Left: Right Pain - part of body: Hip     Time: 0350-0938 PT Time Calculation (min) (ACUTE ONLY): 31 min  Charges:  $Gait Training: 23-37 mins                   [place] Pager [phone] Office [phone]    [name] 01/29/2018, 1:25 PM

## 2018-01-29 NOTE — Progress Notes (Signed)
PROGRESS NOTE    [name]  EGB:[id number] DOB: [birth year] DOA: 01/24/2018 PCP: Darreld Mclean, MD   Brief Narrative:  84 year old with past medical history relevant for Sjogren's syndrome, lupus, hypothyroidism, chronic pain, erythromelalgia, non-small cell lung cancer status post resection, osteoporosis, osteoarthritis resented to the ED on 01/24/2018 after mechanical fall and found to have right hip dislocation with multiple failed attempts to relocate status post open reduction of hip with revision of lining and bowel on 01/24/2018 with postoperative course complicated by hypotension attributed to acute blood loss anemia as well as adrenal insufficiency.   Assessment & Plan:   Principal Problem:   Unstable right hip arthroplasty Active Problems:   Status post revision of total hip   History of revision of total replacement of right hip joint   Hypovolemic shock (HCC)   Hyperkalemia   Hyponatremia   #) Hypotension secondary to adrenal insufficiency: Resolved with starting steroids -Wean hydrocortisone to 10 mg twice daily p.o. -See plan below for postoperative anemia  #) Acute blood loss anemia: Patient is status post 4 units packed red blood cells postoperatively. -Hemoglobin stable   #) Status post right hip arthroplasty revision: -Plan apparently was discharged with home health physical therapy -Orthopedic surgery following, appreciate recommendations - Postoperative pain control defer to orthopedic surgery -Continue aspirin for prophylaxis  #) Hypothyroidism: - Continue levothyroxine 88 mcg  #) Osteoarthritis/osteoporosis: - Continue vitamin D and calcium  #) Pain/psych: -Continue gabapentin 300 mg every morning, 300 mg every afternoon, 600 mg nightly  #) Sjogren's syndrome/lupus: Patient apparently is not on any treatment for this.  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 Electrolytes: Monitor and  supplement Nutrition: Regular diet  Prophylaxis: Aspirin  Disposition: Pending discharge home with home health PT  Full code   Consultants:   Orthopedic surgery  PCCM  Procedures:   01/24/2018:OPEN REDUCTION OF DISLOCATED ANTERIOR HIP WITH REVISION OF LINER AND HIP BALL   Antimicrobials:   Perioperative antibiotics   Subjective: This morning patient reports she is feeling well.  She does not have any complaints.  Objective: Vitals:   01/29/18 0400 01/29/18 0500 01/29/18 0600 01/29/18 0800  BP: (!) 160/63 (!) 158/59 (!) 160/66 (!) 192/79  Pulse: 68 65 71 82  Resp: 16 15 17 19   Temp: 98 F (36.7 C)   98.4 F (36.9 C)  TempSrc: Oral   Oral  SpO2: 94% 94% 98% 93%  Weight:  58 kg    Height:        Intake/Output Summary (Last 24 hours) at 01/29/2018 0919 Last data filed at 01/29/2018 0616 Gross per 24 hour  Intake 120 ml  Output 350 ml  Net -230 ml   Filed Weights   01/27/18 0500 01/28/18 0500 01/29/18 0500  Weight: 56.9 kg 56.5 kg 58 kg    Examination:  General exam: Appears calm and comfortable  Respiratory system: Clear to auscultation. Respiratory effort normal. Cardiovascular system: Regular rate and rhythm, no murmurs Gastrointestinal system: Abdomen is nondistended, soft and nontender. No organomegaly or masses felt. Normal bowel sounds heard. Central nervous system: Alert and oriented.  Grossly intact, moving all extremities Extremities: Trace lower extremity edema, distal pulses in right lower extremity intact Skin: Incision site is clean dry and intact Psychiatry: Judgement and insight appear normal. Mood & affect appropriate.     Data Reviewed: I have personally reviewed following labs and imaging studies  CBC: Recent Labs  Lab 01/24/18 1248  01/26/18 0255  01/26/18 1257 01/27/18 0259 01/28/18 0258 01/29/18 0347  WBC 6.6   < > 9.2 10.5 9.3 9.2 7.1  NEUTROABS 5.2  --  7.7 9.1*  --   --   --   HGB 8.9*   < > 6.8* 8.4* 8.1* 7.6* 7.6*   HCT 28.7*   < > 20.9* 25.5* 24.7* 23.5* 23.9*  MCV 93.2   < > 91.7 92.1 88.8 91.1 93.7  PLT 189   < > 93* 103* 101* 134* 161   < > = values in this interval not displayed.   Basic Metabolic Panel: Recent Labs  Lab 01/24/18 1248 01/25/18 0315 01/26/18 0255 01/28/18 0258  NA 132* 136 133* 134*  K 5.3* 5.1 4.7 3.7  CL 100 111 109 105  CO2 23 17* 18* 23  GLUCOSE 97 171* 130* 104*  BUN 23 23 31* 31*  CREATININE 0.97 1.26* 1.49* 0.98  CALCIUM 8.9 7.1* 7.5* 8.1*  MG  --   --   --  2.1   GFR: Estimated Creatinine Clearance: 33.2 mL/min (by C-G formula based on SCr of 0.98 mg/dL). Liver Function Tests: No results for input(s): AST, ALT, ALKPHOS, BILITOT, PROT, ALBUMIN in the last 168 hours. No results for input(s): LIPASE, AMYLASE in the last 168 hours. No results for input(s): AMMONIA in the last 168 hours. Coagulation Profile: No results for input(s): INR, PROTIME in the last 168 hours. Cardiac Enzymes: No results for input(s): CKTOTAL, CKMB, CKMBINDEX, TROPONINI in the last 168 hours. BNP (last 3 results) No results for input(s): PROBNP in the last 8760 hours. HbA1C: No results for input(s): HGBA1C in the last 72 hours. CBG: No results for input(s): GLUCAP in the last 168 hours. Lipid Profile: No results for input(s): CHOL, HDL, LDLCALC, TRIG, CHOLHDL, LDLDIRECT in the last 72 hours. Thyroid Function Tests: No results for input(s): TSH, T4TOTAL, FREET4, T3FREE, THYROIDAB in the last 72 hours. Anemia Panel: No results for input(s): VITAMINB12, FOLATE, FERRITIN, TIBC, IRON, RETICCTPCT in the last 72 hours. Sepsis Labs: No results for input(s): PROCALCITON, LATICACIDVEN in the last 168 hours.  Recent Results (from the past 240 hour(s))  MRSA PCR Screening     Status: Abnormal   Collection Time: 01/25/18  1:04 AM  Result Value Ref Range Status   MRSA by PCR POSITIVE (A) NEGATIVE Final    Comment:        The GeneXpert MRSA Assay (FDA approved for NASAL specimens only),  is one component of a comprehensive MRSA colonization surveillance program. It is not intended to diagnose MRSA infection nor to guide or monitor treatment for MRSA infections. RESULT CALLED TO, READ BACK BY AND VERIFIED WITH: JACKSON,K RN @ 0225 ON 01/25/18 JACKSON,K Performed at [facility], [place] [address] [place], [place] [address]          Radiology Studies: No results found.      Scheduled Meds: . sodium chloride   Intravenous Once  . aspirin  81 mg Oral BID  . calcium-vitamin D  1 tablet Oral Q breakfast  . Chlorhexidine Gluconate Cloth  6 each Topical Q0600  . docusate sodium  100 mg Oral BID  . gabapentin  300 mg Oral BID  . gabapentin  600 mg Oral QHS  . hydrocortisone  10 mg Oral BID  . levothyroxine  88 mcg Oral QHS  . mouth rinse  15 mL Mouth Rinse BID  . multivitamin with minerals  1 tablet Oral Daily  . mupirocin ointment  1  application Nasal BID  . pantoprazole  40 mg Oral Daily   Continuous Infusions: . methocarbamol (ROBAXIN) IV       LOS: 5 days    Time spent: [place], MD [facility]  If 7PM-7AM, please contact night-coverage [email or web address] Password [email or web address] 01/29/2018, 9:19 AM

## 2018-01-29 NOTE — Progress Notes (Signed)
Subjective: 5 Days Post-Op Procedure(s) (LRB): OPEN REDUCTION OF DISLOCATED ANTERIOR HIP WITH REVISION OF LINER AND HIP BALL (Right) Patient reports pain as mild.  Has been ambulating with therapy.  Objective: Vital signs in last 24 hours: Temp:  [97.9 F (36.6 C)-98.4 F (36.9 C)] 98.2 F (36.8 C) (12/13 1518) Pulse Rate:  [58-104] 80 (12/13 1518) Resp:  [11-32] 16 (12/13 1400) BP: (146-192)/(55-79) 152/72 (12/13 1518) SpO2:  [93 %-100 %] 98 % (12/13 1518) Weight:  [58 kg] 58 kg (12/13 0500)  Intake/Output from previous day: 12/12 0701 - 12/13 0700 In: 240 [P.O.:240] Out: 350 [Urine:350] Intake/Output this shift: Total I/O In: 360 [P.O.:360] Out: 150 [Urine:150]  Recent Labs    01/27/18 0259 01/28/18 0258 01/29/18 0347  HGB 8.1* 7.6* 7.6*   Recent Labs    01/28/18 0258 01/29/18 0347  WBC 9.2 7.1  RBC 2.58* 2.55*  HCT 23.5* 23.9*  PLT 134* 161   Recent Labs    01/28/18 0258  NA 134*  K 3.7  CL 105  CO2 23  BUN 31*  CREATININE 0.98  GLUCOSE 104*  CALCIUM 8.1*   No results for input(s): LABPT, INR in the last 72 hours.  Sensation intact distally Intact pulses distally Dorsiflexion/Plantar flexion intact Incision: dressing C/D/I  Assessment/Plan: 5 Days Post-Op Procedure(s) (LRB): OPEN REDUCTION OF DISLOCATED ANTERIOR HIP WITH REVISION OF LINER AND HIP BALL (Right) Up with therapy Discharge to SNF Monday.    [name] 01/29/2018, 4:53 PM

## 2018-01-29 NOTE — Progress Notes (Signed)
Physical Therapy Treatment Patient Details Name: [name] MRN: [id number] DOB: 1930-09-21 Today's Date: 01/29/2018    History of Present Illness 84 y.o. female admitted with fall, dislocation of R anterior THA. s/p open reduction and revision of liner and ball. Pt had post op hypotension.  PMH: R DA-THA 2016, R hip revision 11/27/17, RA, lung cancer Sjorgrens syndrome.     PT Comments     Patient in bed, Purewick malfunctioning and bed soaked. Assisted to [facility] , to get a bath. To ambulate next visit..   Follow Up Recommendations  Home health PT     Equipment Recommendations  None recommended by PT    Recommendations for Other Services       Precautions / Restrictions Precautions Precautions: Fall Precaution Comments: No R hip ABDuction Restrictions Weight Bearing Restrictions: Yes RLE Weight Bearing: Weight bearing as tolerated    Mobility  Bed Mobility   Bed Mobility: Supine to Sit     Supine to sit: Min assist     General bed mobility comments: min assist for  right leg, extra time  Transfers   Equipment used: Rolling walker (2 wheeled) Transfers: Sit to/from [facility] Sit to Stand: Min assist;From elevated surface Stand pivot transfers: Min assist       General transfer comment: pt self cues UE/LE placement from bed to [facility],  Ambulation/Gait                 Stairs             Wheelchair Mobility    Modified Rankin (Stroke Patients Only)       Balance                                            Cognition Arousal/Alertness: Awake/alert                                            Exercises      General Comments        Pertinent Vitals/Pain Pain Score: 2  Pain Location: R hip Pain Descriptors / Indicators: Sore Pain Intervention(s): Monitored during session;Premedicated before session    Home Living                      Prior Function             PT Goals (current goals can now be found in the care plan section) Progress towards PT goals: Progressing toward goals    Frequency    Min 3X/week      PT Plan Current plan remains appropriate    Co-evaluation              AM-PAC PT "6 Clicks" Mobility   Outcome Measure  Help needed turning from your back to your side while in a flat bed without using bedrails?: A Lot Help needed moving from lying on your back to sitting on the side of a flat bed without using bedrails?: A Lot Help needed moving to and from a bed to a chair (including a wheelchair)?: A Lot Help needed standing up from a chair using your arms (e.g., wheelchair or bedside chair)?: A Little Help needed to walk in hospital room?: A Little Help needed  climbing 3-5 steps with a railing? : Total 6 Click Score: 13    End of Session   Activity Tolerance: Patient tolerated treatment well Patient left: in chair;with nursing/sitter in room;with family/visitor present(on BSC for\ bath) Nurse Communication: Mobility status PT Visit Diagnosis: Unsteadiness on feet (R26.81) Pain - Right/Left: Right Pain - part of body: Hip     Time: 0825-0902 PT Time Calculation (min) (ACUTE ONLY): 37 min  Charges:  $Therapeutic Activity: 8-22 mins $Self Care/Home Management: [place] Pager [phone] Office [phone]    [name] 01/29/2018, 10:39 AM

## 2018-01-30 LAB — TSH: TSH: 2.776 u[IU]/mL (ref 0.350–4.500)

## 2018-01-30 NOTE — Progress Notes (Signed)
PROGRESS NOTE    [name]  NGE:[id number] DOB: January 30, 1931 DOA: 01/24/2018 PCP: Darreld Mclean, MD   Brief Narrative:  84 year old with past medical history relevant for Sjogren's syndrome, lupus, hypothyroidism, chronic pain, erythromelalgia, non-small cell lung cancer status post resection, osteoporosis, osteoarthritis resented to the ED on 01/24/2018 after mechanical fall and found to have right hip dislocation with multiple failed attempts to relocate status post open reduction of hip with revision of lining and bowel on 01/24/2018 with postoperative course complicated by hypotension attributed to acute blood loss anemia as well as adrenal insufficiency.   Assessment & Plan:   Principal Problem:   Unstable right hip arthroplasty Active Problems:   Status post revision of total hip   History of revision of total replacement of right hip joint   Hypovolemic shock (HCC)   Hyperkalemia   Hyponatremia   #) Hypotension secondary to adrenal insufficiency: Resolved with starting steroids -Wean hydrocortisone to 10 mg twice daily p.o. -See plan below for postoperative anemia  #) Acute blood loss anemia: Patient is status post 4 units packed red blood cells postoperatively. -Hemoglobin stable   #) Status post right hip arthroplasty revision: -Orthopedic surgery following, appreciate recommendations - Postoperative pain control defer to orthopedic surgery -Continue aspirin for prophylaxis -Pending discharge to likely skilled nursing facility  #) Hypothyroidism: - Continue levothyroxine 88 mcg -Pending TSH  #) NSVT: Patient reportedly has a history of NSVT and has had evaluation approximately 10 years ago that she reports unremarkable.  #) Osteoarthritis/osteoporosis: - Continue vitamin D and calcium  #) Pain/psych: -Continue gabapentin 300 mg every morning, 300 mg every afternoon, 600 mg nightly  #) Sjogren's syndrome/lupus: Patient apparently is not on any treatment  for this.  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 Electrolytes: Monitor and supplement Nutrition: Regular diet  Prophylaxis: Aspirin  Disposition: Pending discharge home with home health PT  Full code   Consultants:   Orthopedic surgery  PCCM  Procedures:   01/24/2018:OPEN REDUCTION OF DISLOCATED ANTERIOR HIP WITH REVISION OF LINER AND HIP BALL   Antimicrobials:   Perioperative antibiotics   Subjective: This morning patient reports she is feeling well.  She does not have any complaints.  Objective: Vitals:   01/29/18 1400 01/29/18 1518 01/29/18 2042 01/30/18 0628  BP: (!) 152/66 (!) 152/72 138/64 119/74  Pulse: 83 80 82 88  Resp: 16  16 16   Temp:  98.2 F (36.8 C) 98.1 F (36.7 C) 98 F (36.7 C)  TempSrc:  Oral Oral Oral  SpO2: 99% 98% 97% 97%  Weight:      Height:        Intake/Output Summary (Last 24 hours) at 01/30/2018 1015 Last data filed at 01/29/2018 2100 Gross per 24 hour  Intake 840 ml  Output 150 ml  Net 690 ml   Filed Weights   01/27/18 0500 01/28/18 0500 01/29/18 0500  Weight: 56.9 kg 56.5 kg 58 kg    Examination:  General exam: Appears calm and comfortable  Respiratory system: Clear to auscultation. Respiratory effort normal. Cardiovascular system: Regular rate and rhythm, no murmurs Gastrointestinal system: Abdomen is nondistended, soft and nontender. No organomegaly or masses felt. Normal bowel sounds heard. Central nervous system: Alert and oriented.  Grossly intact, moving all extremities Extremities: Trace lower extremity edema, distal pulses in right lower extremity intact Skin: Incision site is clean dry and intact Psychiatry: Judgement and insight appear normal. Mood & affect appropriate.  Data Reviewed: I have personally reviewed following labs and imaging studies  CBC: Recent Labs  Lab 01/24/18 1248  01/26/18 0255 01/26/18 1257 01/27/18 0259 01/28/18 0258 01/29/18 0347    WBC 6.6   < > 9.2 10.5 9.3 9.2 7.1  NEUTROABS 5.2  --  7.7 9.1*  --   --   --   HGB 8.9*   < > 6.8* 8.4* 8.1* 7.6* 7.6*  HCT 28.7*   < > 20.9* 25.5* 24.7* 23.5* 23.9*  MCV 93.2   < > 91.7 92.1 88.8 91.1 93.7  PLT 189   < > 93* 103* 101* 134* 161   < > = values in this interval not displayed.   Basic Metabolic Panel: Recent Labs  Lab 01/24/18 1248 01/25/18 0315 01/26/18 0255 01/28/18 0258  NA 132* 136 133* 134*  K 5.3* 5.1 4.7 3.7  CL 100 111 109 105  CO2 23 17* 18* 23  GLUCOSE 97 171* 130* 104*  BUN 23 23 31* 31*  CREATININE 0.97 1.26* 1.49* 0.98  CALCIUM 8.9 7.1* 7.5* 8.1*  MG  --   --   --  2.1   GFR: Estimated Creatinine Clearance: 33.2 mL/min (by C-G formula based on SCr of 0.98 mg/dL). Liver Function Tests: No results for input(s): AST, ALT, ALKPHOS, BILITOT, PROT, ALBUMIN in the last 168 hours. No results for input(s): LIPASE, AMYLASE in the last 168 hours. No results for input(s): AMMONIA in the last 168 hours. Coagulation Profile: No results for input(s): INR, PROTIME in the last 168 hours. Cardiac Enzymes: No results for input(s): CKTOTAL, CKMB, CKMBINDEX, TROPONINI in the last 168 hours. BNP (last 3 results) No results for input(s): PROBNP in the last 8760 hours. HbA1C: No results for input(s): HGBA1C in the last 72 hours. CBG: No results for input(s): GLUCAP in the last 168 hours. Lipid Profile: No results for input(s): CHOL, HDL, LDLCALC, TRIG, CHOLHDL, LDLDIRECT in the last 72 hours. Thyroid Function Tests: No results for input(s): TSH, T4TOTAL, FREET4, T3FREE, THYROIDAB in the last 72 hours. Anemia Panel: No results for input(s): VITAMINB12, FOLATE, FERRITIN, TIBC, IRON, RETICCTPCT in the last 72 hours. Sepsis Labs: No results for input(s): PROCALCITON, LATICACIDVEN in the last 168 hours.  Recent Results (from the past 240 hour(s))  MRSA PCR Screening     Status: Abnormal   Collection Time: 01/25/18  1:04 AM  Result Value Ref Range Status   MRSA  by PCR POSITIVE (A) NEGATIVE Final    Comment:        The GeneXpert MRSA Assay (FDA approved for NASAL specimens only), is one component of a comprehensive MRSA colonization surveillance program. It is not intended to diagnose MRSA infection nor to guide or monitor treatment for MRSA infections. RESULT CALLED TO, READ BACK BY AND VERIFIED WITH: JACKSON,K RN @ 0225 ON 01/25/18 JACKSON,K Performed at [facility], [place] [address] [place], [place] [address]          Radiology Studies: No results found.      Scheduled Meds: . sodium chloride   Intravenous Once  . aspirin  81 mg Oral BID  . calcium-vitamin D  1 tablet Oral Q breakfast  . docusate sodium  100 mg Oral BID  . gabapentin  300 mg Oral BID  . gabapentin  600 mg Oral QHS  . levothyroxine  88 mcg Oral QHS  . mouth rinse  15 mL Mouth Rinse BID  . multivitamin with minerals  1 tablet Oral Daily  .  pantoprazole  40 mg Oral Daily   Continuous Infusions: . methocarbamol (ROBAXIN) IV       LOS: 6 days    Time spent: [place], MD [facility]  If 7PM-7AM, please contact night-coverage [email or web address] Password [email or web address] 01/30/2018, 10:15 AM

## 2018-01-30 NOTE — Progress Notes (Signed)
Subjective: 6 Days Post-Op Procedure(s) (LRB): OPEN REDUCTION OF DISLOCATED ANTERIOR HIP WITH REVISION OF LINER AND HIP BALL (Right) Patient reports pain as mild.  Labs apparently drawn this am, but not back yet.  Her vitals are stable and she feels good.  Denies light-headedness.  Her H/H has run low, but we are just watching.  Would not consider a transfusion (just a single unit) unless she becomes symptomatic.  Objective: Vital signs in last 24 hours: Temp:  [98 F (36.7 C)-98.4 F (36.9 C)] 98 F (36.7 C) (12/14 0628) Pulse Rate:  [80-94] 88 (12/14 0628) Resp:  [16-20] 16 (12/14 0628) BP: (119-152)/(64-74) 119/74 (12/14 0628) SpO2:  [97 %-100 %] 97 % (12/14 0628)  Intake/Output from previous day: 12/13 0701 - 12/14 0700 In: 840 [P.O.:840] Out: 150 [Urine:150] Intake/Output this shift: No intake/output data recorded.  Recent Labs    01/28/18 0258 01/29/18 0347  HGB 7.6* 7.6*   Recent Labs    01/28/18 0258 01/29/18 0347  WBC 9.2 7.1  RBC 2.58* 2.55*  HCT 23.5* 23.9*  PLT 134* 161   Recent Labs    01/28/18 0258  NA 134*  K 3.7  CL 105  CO2 23  BUN 31*  CREATININE 0.98  GLUCOSE 104*  CALCIUM 8.1*   No results for input(s): LABPT, INR in the last 72 hours.  Sensation intact distally Intact pulses distally Dorsiflexion/Plantar flexion intact Incision: dressing C/D/I  Assessment/Plan: 6 Days Post-Op Procedure(s) (LRB): OPEN REDUCTION OF DISLOCATED ANTERIOR HIP WITH REVISION OF LINER AND HIP BALL (Right) Up with therapy Discharge to SNF hopefully Monday. I have put in for a social Work consult multiple times, but they have yet to see her.    [name] 01/30/2018, 11:39 AM

## 2018-01-31 DIAGNOSIS — D62 Acute posthemorrhagic anemia: Secondary | ICD-10-CM

## 2018-01-31 LAB — CBC
HCT: 22.4 % — ABNORMAL LOW (ref 36.0–46.0)
Hemoglobin: 7.2 g/dL — ABNORMAL LOW (ref 12.0–15.0)
MCH: 29.9 pg (ref 26.0–34.0)
MCHC: 32.1 g/dL (ref 30.0–36.0)
MCV: 92.9 fL (ref 80.0–100.0)
Platelets: 198 10*3/uL (ref 150–400)
RBC: 2.41 MIL/uL — ABNORMAL LOW (ref 3.87–5.11)
RDW: 17.2 % — ABNORMAL HIGH (ref 11.5–15.5)
WBC: 9.5 10*3/uL (ref 4.0–10.5)
nRBC: 0 % (ref 0.0–0.2)

## 2018-01-31 LAB — PREPARE RBC (CROSSMATCH)

## 2018-01-31 MED ORDER — SODIUM CHLORIDE 0.9% IV SOLUTION
Freq: Once | INTRAVENOUS | Status: AC
  Administered 2018-01-31: 11:00:00 via INTRAVENOUS

## 2018-01-31 MED ORDER — FUROSEMIDE 10 MG/ML IJ SOLN
40.0000 mg | Freq: Once | INTRAMUSCULAR | Status: AC
  Administered 2018-01-31: 40 mg via INTRAVENOUS
  Filled 2018-01-31: qty 4

## 2018-01-31 NOTE — Progress Notes (Addendum)
Please note that this note from 01/31/18 was erroneously addended by Dr. Bonnielee Haff on 12/16.  Patient was seen by Dr. Herbert Moors on 12/15.  His note is pasted below.  Purohit, Konrad Dolores, MD  Physician  Internal Medicine  Progress Notes  Signed  Date of Service:  01/31/2018 11:36 AM             Expand All Collapse All     PROGRESS NOTE    [name]        SAY:[id number] DOB: 07-Aug-1930 DOA: 01/24/2018 PCP: Darreld Mclean, MD             Brief Narrative:  84 year old with past medical history relevant for Sjogren's syndrome, lupus, hypothyroidism, chronic pain, erythromelalgia, non-small cell lung cancer status post resection, osteoporosis, osteoarthritis resented to the ED on 01/24/2018 after mechanical fall and found to have right hip dislocation with multiple failed attempts to relocate status post open reduction of hip with revision of lining and bowel on 01/24/2018 with postoperative course complicated by hypotension attributed to acute blood loss anemia as well as adrenal insufficiency.   Assessment & Plan:   Principal Problem:   Unstable right hip arthroplasty Active Problems:   Status post revision of total hip   History of revision of total replacement of right hip joint   Hypovolemic shock (HCC)   Hyperkalemia   Hyponatremia   #) Hypotension secondary to adrenal insufficiency: Completed course of stress dose steroids rapidly tapered off. -See plan below for postoperative anemia  #) Acute blood loss anemia: -Transfuse again today, will give 1 dose of IV furosemide after transfusion  #) Status post right hip arthroplasty revision: -Orthopedic surgery following, appreciate recommendations - Postoperative pain control defer to orthopedic surgery -Continue aspirin for prophylaxis -Pending discharge to likely skilled nursing facility  #) Hypothyroidism: - Continue levothyroxine 88 mcg -TSH is 2.776  #) NSVT: Patient reportedly has a  history of NSVT and has had evaluation approximately 10 years ago that she reports unremarkable.  #) Osteoarthritis/osteoporosis: - Continue vitamin D and calcium  #) Pain/psych: -Continue gabapentin 300 mg every morning, 300 mg every afternoon, 600 mg nightly  #) Sjogren's syndrome/lupus: Patient apparently is not on any treatment for this.  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 Electrolytes: Monitor and supplement Nutrition: Regular diet  Prophylaxis: Aspirin  Disposition: Pending discharge home with home health PT  Full code   Consultants:   Orthopedic surgery  PCCM  Procedures:   01/24/2018:OPEN REDUCTION OF DISLOCATED ANTERIOR HIP WITH REVISION OF LINER AND HIP BALL   Antimicrobials:   Perioperative antibiotics   Subjective: This morning patient reports she is feeling well.  She does not have any complaints.  Objective:       Vitals:   01/30/18 2247 01/31/18 0627 01/31/18 1022 01/31/18 1105  BP: 139/60 136/72 137/62 139/67  Pulse: 91 92 88 89  Resp: 16 16 17 16   Temp: 98.9 F (37.2 C) 98.9 F (37.2 C) 99.1 F (37.3 C) 98 F (36.7 C)  TempSrc: Oral Oral Oral Axillary  SpO2: 97% 95% 95% 91%  Weight:  56.7 kg    Height:       No intake or output data in the 24 hours ending 01/31/18 1136      Filed Weights   01/28/18 0500 01/29/18 0500 01/31/18 0627  Weight: 56.5 kg 58 kg 56.7 kg    Examination:  General exam: Appears calm and comfortable  Respiratory system: Clear to auscultation. Respiratory effort normal. Cardiovascular system: Regular rate and rhythm, no murmurs Gastrointestinal system: Abdomen is nondistended, soft and nontender. No organomegaly or masses felt. Normal bowel sounds heard. Central nervous system: Alert and oriented.  Grossly intact, moving all extremities Extremities: Trace lower extremity edema, distal pulses in right lower extremity intact Skin: Incision  site is clean dry and intact Psychiatry: Judgement and insight appear normal. Mood & affect appropriate.     Data Reviewed: I have personally reviewed following labs and imaging studies  CBC: LastLabs            Recent Labs  Lab 01/24/18 1248  01/26/18 0255 01/26/18 1257 01/27/18 0259 01/28/18 0258 01/29/18 0347 01/31/18 0519  WBC 6.6   < > 9.2 10.5 9.3 9.2 7.1 9.5  NEUTROABS 5.2  --  7.7 9.1*  --   --   --   --   HGB 8.9*   < > 6.8* 8.4* 8.1* 7.6* 7.6* 7.2*  HCT 28.7*   < > 20.9* 25.5* 24.7* 23.5* 23.9* 22.4*  MCV 93.2   < > 91.7 92.1 88.8 91.1 93.7 92.9  PLT 189   < > 93* 103* 101* 134* 161 198   < > = values in this interval not displayed.     Basic Metabolic Panel: LastLabs        Recent Labs  Lab 01/24/18 1248 01/25/18 0315 01/26/18 0255 01/28/18 0258  NA 132* 136 133* 134*  K 5.3* 5.1 4.7 3.7  CL 100 111 109 105  CO2 23 17* 18* 23  GLUCOSE 97 171* 130* 104*  BUN 23 23 31* 31*  CREATININE 0.97 1.26* 1.49* 0.98  CALCIUM 8.9 7.1* 7.5* 8.1*  MG  --   --   --  2.1     GFR: Estimated Creatinine Clearance: 33.2 mL/min (by C-G formula based on SCr of 0.98 mg/dL). Liver Function Tests: LastLabs  No results for input(s): AST, ALT, ALKPHOS, BILITOT, PROT, ALBUMIN in the last 168 hours.   LastLabs  No results for input(s): LIPASE, AMYLASE in the last 168 hours.   LastLabs  No results for input(s): AMMONIA in the last 168 hours.   Coagulation Profile: LastLabs  No results for input(s): INR, PROTIME in the last 168 hours.   Cardiac Enzymes: LastLabs  No results for input(s): CKTOTAL, CKMB, CKMBINDEX, TROPONINI in the last 168 hours.   BNP (last 3 results) RecentLabs(withinlast365days)  No results for input(s): PROBNP in the last 8760 hours.   HbA1C: RecentLabs(last2labs)  No results for input(s): HGBA1C in the last 72 hours.   CBG: LastLabs  No results for input(s): GLUCAP in the last 168 hours.   Lipid  Profile: RecentLabs(last2labs)  No results for input(s): CHOL, HDL, LDLCALC, TRIG, CHOLHDL, LDLDIRECT in the last 72 hours.   Thyroid Function Tests: RecentLabs(last2labs)  Recent Labs    01/30/18 0855  TSH 2.776     Anemia Panel: RecentLabs(last2labs)  No results for input(s): VITAMINB12, FOLATE, FERRITIN, TIBC, IRON, RETICCTPCT in the last 72 hours.   Sepsis Labs: LastLabs  No results for input(s): PROCALCITON, LATICACIDVEN in the last 168 hours.    Recent Results (from the past 240 hour(s))  MRSA PCR Screening     Status: Abnormal   Collection Time: 01/25/18  1:04 AM  Result Value Ref Range Status   MRSA by PCR POSITIVE (A) NEGATIVE Final    Comment:        The GeneXpert MRSA Assay (FDA approved for NASAL  specimens only), is one component of a comprehensive MRSA colonization surveillance program. It is not intended to diagnose MRSA infection nor to guide or monitor treatment for MRSA infections. RESULT CALLED TO, READ BACK BY AND VERIFIED WITH: JACKSON,K RN @ 0225 ON 01/25/18 JACKSON,K Performed at [facility], [place] [address] [place], [place] [address]          Radiology Studies: ImagingResults(Last48hours)  No results found.        Scheduled Meds: . sodium chloride   Intravenous Once  . sodium chloride   Intravenous Once  . aspirin  81 mg Oral BID  . calcium-vitamin D  1 tablet Oral Q breakfast  . docusate sodium  100 mg Oral BID  . furosemide  40 mg Intravenous Once  . gabapentin  300 mg Oral BID  . gabapentin  600 mg Oral QHS  . levothyroxine  88 mcg Oral QHS  . mouth rinse  15 mL Mouth Rinse BID  . multivitamin with minerals  1 tablet Oral Daily  . pantoprazole  40 mg Oral Daily   Continuous Infusions: . methocarbamol (ROBAXIN) IV       LOS: 7 days    Time spent: [place], MD [facility]  If 7PM-7AM, please contact  night-coverage [email or web address] Password [facility] 01/31/2018, 11:36 AM

## 2018-01-31 NOTE — Clinical Social Work Note (Signed)
Clinical Social Work Assessment  Patient Details  Name: [name] MRN: [id number] Date of Birth: [birth year]  Date of referral:  01/30/18               Reason for consult:  Facility Placement                Permission sought to share information with:  Facility Art therapist granted to share information::  Yes, Verbal Permission Granted  Name::     [facility] Campi  Agency::  SNF  Relationship::  Daughter  Contact Information:  [phone]  Housing/Transportation Living arrangements for the past 2 months:  [place] of Information:  Patient, Adult Children Patient Interpreter Needed:  None Criminal Activity/Legal Involvement Pertinent to Current Situation/Hospitalization:  No - Comment as needed Significant Relationships:  Adult Children, [place], Friend Lives with:  Self Do you feel safe going back to the place where you live?  Yes Need for family participation in patient care:  No (Coment)  Care giving concerns:  Patient lives alone with caregiver coming 4-6 hrs/day. Patient has experienced two falls in recent months most recently presenting to ED 12/8 after mechanical fall and found to have right hip dislocation. PT recommending home w/ HH, however, MD and daughter prefer SNF placement.    Social Worker assessment / plan:  CSW met with patient and daughter, [name], at bedside to discuss discharge plans. Per chart review, PT recommending home with home health services. Patient reports she lives alone with caregiver who comes 4-6 hrs/day. Due to recent falls, daughter has been staying with her overnight. Patient's daughter provides support and lives nearby, patient has supportive church community and friends.   Patient reluctantly agreeable to SNF placement for 5 days or less. She discussed plans for her daughter, who lives in [place], to be home with her for the holiday season.   Patient and daughter had questions regarding insurance coverage and  staff/therapy programs at SNFs. CSW answered questions satisfactorily.   Patient and daughter prefer [facility]. They were agreeable to CSW submitting referrals to additional facilities incase [facility] is unable to accept.   Patient will need [facility] insurance auth. CSW will complete FL2 and send referrals.   Employment status:  Retired Nurse, adult PT Recommendations:  Home with [place] / Referral to community resources:  [place]  Patient/Family's Response to care:  Patient was appreciative of CSW involvement and explanation of SNF process.   Patient/Family's Understanding of and Emotional Response to Diagnosis, Current Treatment, and Prognosis:  Patient understands SNF process and f/u plans. Explained [facility] insurance auth process - patient and daughter report understanding.  Emotional Assessment Appearance:  Appears stated age Attitude/Demeanor/Rapport:  Engaged Affect (typically observed):  Adaptable, Accepting, Appropriate, Pleasant Orientation:  Oriented to Place, Oriented to  Time, Oriented to Situation, Oriented to Self Alcohol / Substance use:  Not Applicable Psych involvement (Current and /or in the community):     Discharge Needs  Concerns to be addressed:  Care Coordination Readmission within the last 30 days:  No Current discharge risk:  Physical Impairment, Lives alone Barriers to Discharge:  Ship broker, Continued Medical Work up   [employer], [place] 01/31/2018, 10:23 AM

## 2018-01-31 NOTE — NC FL2 (Signed)
[place] LEVEL OF CARE SCREENING TOOL     IDENTIFICATION  Patient Name: [name] Birthdate: 03-29-1930 Sex: female Admission Date (Current Location): 01/24/2018  [facility] and [place] Number:  Herbalist and Address:  [facility],  [place], [place]      Provider Number: [id number]  Attending Physician Name and Address:  [name],*  Relative Name and Phone Number:  Aleen Campi: [phone]    Current Level of Care: Hospital Recommended Level of Care: [place] Prior Approval Number:    Date Approved/Denied:   PASRR Number: [id number]  Discharge Plan: SNF    Current Diagnoses: Patient Active Problem List   Diagnosis Date Noted  . Unstable right hip arthroplasty 01/24/2018  . History of revision of total replacement of right hip joint 01/24/2018  . Hypovolemic shock ([place])   . Hyperkalemia   . Hyponatremia   . Failed total hip arthroplasty ([place]) 11/27/2017  . Status post revision of total hip 11/27/2017  . Elevated cholesterol 10/11/2015  . 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[place]) 10/04/2014  . Bilateral leg edema 08/01/2014  . Elevated BP 08/01/2014  . Dehydration 08/01/2014  . Rheumatoid arthritis involving multiple joints ([place]) 05/30/2014  . Osteoarthritis of right hip 04/28/2014  . Status post total replacement of right hip 04/28/2014  . Long-term use of high-risk medication 11/11/2013  . Routine general medical examination at a health care facility 11/11/2013  . Anemia, unspecified 11/11/2013  . Neuropathic pain 07/07/2013  . Constipation due to pain medication 07/07/2013  . Protein-calorie malnutrition, severe ([place]) 06/08/2013  . Lung cancer, Right upper lobe 05/08/2013  . Anterior chest wall pain 09/18/2011  . Breast lump 09/18/2011  . Sciatica of right side 08/13/2011  . Sinusitis 06/13/2011  . Cerumen impaction 08/20/2010  . SINUSITIS - ACUTE-NOS 04/30/2010  .  Osteopenia 03/07/2010  . PARESTHESIA 03/07/2010  . CONTUSION OF LOWER LEG 11/29/2009  . DIZZINESS 10/01/2009  . Backache 03/16/2009  . CT, CHEST, ABNORMAL 12/18/2008  . ABNORMAL ECHOCARDIOGRAM 12/14/2008  . URI 11/29/2008  . [place] SYNDROME 11/29/2008  . CONTACT DERMATITIS 11/02/2006  . HYPOGLYCEMIA 06/29/2006  . RAYNAUD'S DISEASE 06/29/2006    Orientation RESPIRATION BLADDER Height & Weight     Self, Time, Situation, Place  Normal Continent Weight: 124 lb 14.4 oz (56.7 kg) Height:  5\' 2"  (157.5 cm)  BEHAVIORAL SYMPTOMS/MOOD NEUROLOGICAL BOWEL NUTRITION STATUS      Continent Diet(Regular)  AMBULATORY STATUS COMMUNICATION OF NEEDS Skin   Limited Assist Verbally Surgical wounds(R hip incision)                       Personal Care Assistance Level of Assistance  Bathing, Feeding, Dressing Bathing Assistance: Independent Feeding assistance: Independent Dressing Assistance: Independent     Functional Limitations Info  Sight, Hearing, Speech Sight Info: Adequate Hearing Info: Adequate Speech Info: Adequate    SPECIAL CARE FACTORS FREQUENCY  PT (By licensed PT), OT (By licensed OT)     PT Frequency: 5x/week OT Frequency: 5x/week            Contractures Contractures Info: Not present    Additional Factors Info  Code Status, Allergies Code Status Info: Full Allergies Info: AMLODIPINE, PROCHLORPERAZINE EDISYLATE, ASPIRIN, CYMBALTA DULOXETINE HCL, PAMELOR NORTRIPTYLINE HCL            Current Medications (01/31/2018):  This is the current hospital active medication list Current Facility-Administered Medications  Medication Dose Route Frequency Provider Last  Rate Last Dose  . 0.9 %  sodium chloride infusion (Manually program via Guardrails IV Fluids)   Intravenous Once [name], MD      . 0.9 %  sodium chloride infusion (Manually program via Guardrails IV Fluids)   Intravenous Once Purohit, Shrey C, MD      . acetaminophen (TYLENOL) tablet  325-650 mg  325-650 mg Oral Q6H PRN [name], MD   650 mg at 01/25/18 1342  . alum & mag hydroxide-simeth (MAALOX/MYLANTA) 200-200-20 MG/5ML suspension 30 mL  30 mL Oral Q4H PRN [name], MD      . aspirin chewable tablet 81 mg  81 mg Oral BID [name], MD   81 mg at 01/31/18 0934  . calcium-vitamin D (OSCAL WITH D) 500-200 MG-UNIT per tablet 1 tablet  1 tablet Oral Q breakfast [name], MD   1 tablet at 01/31/18 [phone]  . diphenhydrAMINE (BENADRYL) 12.5 MG/5ML elixir 12.5-25 mg  12.5-25 mg Oral Q4H PRN [name], MD      . docusate sodium (COLACE) capsule 100 mg  100 mg Oral BID [name], MD   100 mg at 01/31/18 0934  . furosemide (LASIX) injection 40 mg  40 mg Intravenous Once Purohit, Shrey C, MD      . gabapentin (NEURONTIN) capsule 300 mg  300 mg Oral BID [name], MD   300 mg at 01/31/18 0934  . gabapentin (NEURONTIN) capsule 600 mg  600 mg Oral QHS [name], MD   600 mg at 01/30/18 2333  . HYDROcodone-acetaminophen (NORCO) 7.5-325 MG per tablet 1-2 tablet  1-2 tablet Oral Q4H PRN [name], MD      . HYDROcodone-acetaminophen (NORCO/VICODIN) 5-325 MG per tablet 1-2 tablet  1-2 tablet Oral Q4H PRN [name], MD   1 tablet at 01/31/18 0736  . levothyroxine (SYNTHROID, LEVOTHROID) tablet 88 mcg  88 mcg Oral QHS [name], MD   88 mcg at 01/30/18 2333  . MEDLINE mouth rinse  15 mL Mouth Rinse BID [name], MD   15 mL at 01/31/18 0934  . menthol-cetylpyridinium (CEPACOL) lozenge 3 mg  1 lozenge Oral PRN [name], MD       Or  . phenol (CHLORASEPTIC) mouth spray 1 spray  1 spray Mouth/Throat PRN [name], MD      . methocarbamol (ROBAXIN) tablet 500 mg  500 mg Oral Q6H PRN [name], MD       Or  . methocarbamol (ROBAXIN) 500 mg in dextrose 5 % 50 mL IVPB  500 mg Intravenous Q6H PRN  [name], MD      . metoCLOPramide (REGLAN) tablet 5-10 mg  5-10 mg Oral Q8H PRN [name], MD       Or  . metoCLOPramide (REGLAN) injection 5-10 mg  5-10 mg Intravenous Q8H PRN [name], MD      . morphine 2 MG/ML injection 0.5-1 mg  0.5-1 mg Intravenous Q2H PRN [name], MD      . multivitamin with minerals tablet 1 tablet  1 tablet Oral Daily [name], MD   1 tablet at 01/31/18 [phone]  . ondansetron (ZOFRAN) tablet 4 mg  4 mg Oral Q6H PRN [name], MD       Or  . ondansetron [facility]) injection 4 mg  4 mg Intravenous Q6H PRN [name], MD      .  pantoprazole (PROTONIX) EC tablet 40 mg  40 mg Oral Daily [name], MD   40 mg at 01/31/18 0934  . polyethylene glycol (MIRALAX / GLYCOLAX) packet 17 g  17 g Oral Daily PRN [name], MD         Discharge Medications: Please see discharge summary for a list of discharge medications.  Relevant Imaging Results:  Relevant Lab Results:   Additional Information SSN: [id number]  Pricilla Holm, [place]

## 2018-01-31 NOTE — Progress Notes (Signed)
Subjective: 7 Days Post-Op Procedure(s) (LRB): OPEN REDUCTION OF DISLOCATED ANTERIOR HIP WITH REVISION OF LINER AND HIP BALL (Right) Patient reports pain as mild.    Objective: Vital signs in last 24 hours: Temp:  [97.6 F (36.4 C)-99.1 F (37.3 C)] 98 F (36.7 C) (12/15 1105) Pulse Rate:  [88-92] 89 (12/15 1105) Resp:  [16-18] 16 (12/15 1105) BP: (136-147)/(60-72) 139/67 (12/15 1105) SpO2:  [91 %-97 %] 91 % (12/15 1105) Weight:  [56.7 kg] 56.7 kg (12/15 0627)  Intake/Output from previous day: No intake/output data recorded. Intake/Output this shift: No intake/output data recorded.  Recent Labs    01/29/18 0347 01/31/18 0519  HGB 7.6* 7.2*   Recent Labs    01/29/18 0347 01/31/18 0519  WBC 7.1 9.5  RBC 2.55* 2.41*  HCT 23.9* 22.4*  PLT 161 198   No results for input(s): NA, K, CL, CO2, BUN, CREATININE, GLUCOSE, CALCIUM in the last 72 hours. No results for input(s): LABPT, INR in the last 72 hours.  Neurologically intact No cellulitis present Compartment soft Up in chair with no related problems Has gained some weight with fluid-pre ordered lasix given  Assessment/Plan: 7 Days Post-Op Procedure(s) (LRB): OPEN REDUCTION OF DISLOCATED ANTERIOR HIP WITH REVISION OF LINER AND HIP BALL (Right) Up with therapy Discharge to SNF-awaiting placement tomorrow    Garald Balding 01/31/2018, 11:17 AM

## 2018-02-01 LAB — CBC
HCT: 33.1 % — ABNORMAL LOW (ref 36.0–46.0)
Hemoglobin: 10.4 g/dL — ABNORMAL LOW (ref 12.0–15.0)
MCH: 27.3 pg (ref 26.0–34.0)
MCHC: 31.4 g/dL (ref 30.0–36.0)
MCV: 86.9 fL (ref 80.0–100.0)
Platelets: 237 10*3/uL (ref 150–400)
RBC: 3.81 MIL/uL — ABNORMAL LOW (ref 3.87–5.11)
RDW: 23.5 % — ABNORMAL HIGH (ref 11.5–15.5)
WBC: 8.8 K/uL (ref 4.0–10.5)
nRBC: 0 % (ref 0.0–0.2)

## 2018-02-01 LAB — BPAM RBC
Blood Product Expiration Date: 202001112359
ISSUE DATE / TIME: 201912151036
Unit Type and Rh: 6200

## 2018-02-01 LAB — TYPE AND SCREEN
ABO/RH(D): A POS
Antibody Screen: NEGATIVE
Unit division: 0

## 2018-02-01 MED ORDER — HYDROCODONE-ACETAMINOPHEN 5-325 MG PO TABS: 1.0000 | ORAL_TABLET | ORAL | 0 refills | Status: DC | PRN

## 2018-02-01 MED ORDER — METHOCARBAMOL 500 MG PO TABS: 500.0000 mg | ORAL_TABLET | Freq: Four times a day (QID) | ORAL | 0 refills | Status: DC | PRN

## 2018-02-01 NOTE — Care Management Important Message (Signed)
Important Message  Patient Details  Name: [name] MRN: [id number] Date of Birth: 1930-02-25   Medicare Important Message Given:  Yes    Kerin Salen 02/01/2018, 2:48 [place] Message  Patient Details  Name: [name] MRN: [id number] Date of Birth: April 02, 1930   Medicare Important Message Given:  Yes    Kerin Salen 02/01/2018, 2:48 PM

## 2018-02-01 NOTE — Progress Notes (Signed)
Patient ID: [name], female   DOB: [birth year], 84 y.o.   MRN: [id number] Doing much better overall.  Responded very well to the unit of blood yesterday.  She is mobilizing well.  Her vitals are stable and her right hip is stable.  Can be discharged to skilled nursing this afternoon.  [place] is preferred.

## 2018-02-01 NOTE — Progress Notes (Signed)
Currently no beds at [facility]. Patient requested [facility]. Patient accepted. The Facility started the insurance authorization process. CSW will update medical team when available.   Kathrin Greathouse, Marlinda Mike, MSW Clinical Social Worker  [phone] 02/01/2018  1:55 PM

## 2018-02-01 NOTE — Discharge Summary (Signed)
Patient ID: [name] MRN: [id number] DOB/AGE: Nov 13, 1930 84 y.o.  Admit date: 01/24/2018 Discharge date: 02/01/2018  Admission Diagnoses:  Principal Problem:   Unstable right hip arthroplasty Active Problems:   Status post revision of total hip   History of revision of total replacement of right hip joint   Hypovolemic shock (HCC)   Hyperkalemia   Hyponatremia   Discharge Diagnoses:  Same  Past Medical History:  Diagnosis Date  . Arthralgia of multiple joints    followed by dr Gerilyn Nestle  . Arthritis   . Chronic constipation   . Chronic inflammatory arthritis    rhemotolgist-  dr a. Gerilyn Nestle [facility])  . Dry eyes    eye drops used   . GERD (gastroesophageal reflux disease)   . H/O discoid lupus erythematosus   . Hiatal hernia   . History of colon polyps   . Hypothyroidism   . Iron deficiency anemia   . LBBB (left bundle branch block) 2010  . Mitchell's disease (erythromelalgia) [facility])    neurologist-  dr patel  . Nocturia   . Non-small cell cancer of right lung [facility]) surgeon-- dr gerhardt/  oncologist-  dr Julien Nordmann--- per lov notes no recurrence/   11-18-2017 per pt denies any symptoms   dx 2015--  Stage IIA (T2b,N0,M0) , +EGFR  mutation in exon 21, non-small cell adenocarcinoma right upper lobe---  s/p  Right upper lobectomy , right middley wedge resection and node dissection---  no chemo or radiation therapy  . OA (osteoarthritis)    hands  . Osteoporosis   . PONV (postoperative nausea and vomiting)    likes phenergan  . Raynaud's phenomenon 1965  . Renal insufficiency   . Rheumatoid arthritis ([place])   . Sciatica   . Scoliosis   . Sjogren's syndrome ([place])   . Urgency of urination     Surgeries: Procedure(s): OPEN REDUCTION OF DISLOCATED ANTERIOR HIP WITH REVISION OF LINER AND HIP BALL on 01/24/2018   Consultants: Treatment Team:  [name], MD Redmond Baseman, MD  Discharged Condition: Improved  Hospital Course: [name] is an 84 y.o. female who was admitted 01/24/2018 for operative treatment ofUnstable right hip.  She had had 2 hard mechanical falls causing a fracture of the greater trochanter and a dislocation of her right hip prosthesis.  After pre-op clearance the patient was taken to the operating room on 01/24/2018 and underwent  Procedure(s): OPEN REDUCTION OF DISLOCATED ANTERIOR HIP WITH REVISION OF LINER AND HIP BALL.    Patient was given perioperative antibiotics:  Anti-infectives (From admission, onward)   Start     Dose/Rate Route Frequency Ordered Stop   01/24/18 2200  ceFAZolin (ANCEF) IVPB 1 g/50 mL premix     1 g 100 mL/hr over 30 Minutes Intravenous Every 6 hours 01/24/18 2156 01/25/18 0658   01/24/18 1445  ceFAZolin (ANCEF) IVPB 2g/100 mL premix     2 g 200 mL/hr over 30 Minutes Intravenous On call to O.R. 01/24/18 1437 01/24/18 1629   01/24/18 1438  ceFAZolin (ANCEF) 2-4 GM/100ML-% IVPB    Note to Pharmacy:  Guerry Bruin   : cabinet override      01/24/18 1438 01/24/18 1559       Patient was given sequential compression devices, early ambulation, and chemoprophylaxis to prevent DVT.  Patient benefited maximally from hospital stay.  She did require a convalescence in the intensive care unit due to acute blood loss anemia from her surgery and what was felt  to be an adrenal insufficiency issue requiring steroids.  She was on pressors at first but then taken off of this in the ICU as she stabilized.  She was then transferred to a regular floor bed and progressed well with mobility.  She did require transfusions during the hospitalization for acute blood loss anemia which she responded to well.  By the day of discharge it was felt she would benefit from a stay in skilled nursing for further rehabilitation due to her being a fall risk.  Due to her significant anemia and her being a fall risk, we are avoiding any prophylactic medications for DVT due to the bleeding risk.  Recent vital  signs:  Patient Vitals for the past 24 hrs:  BP Temp Temp src Pulse Resp SpO2 Weight  02/01/18 0622 133/65 98.4 F (36.9 C) Oral 91 16 97 % -  02/01/18 0500 - - - - - - 56.8 kg  01/31/18 2143 125/62 98.8 F (37.1 C) Oral 84 16 96 % -  01/31/18 1335 (!) 154/75 99.8 F (37.7 C) Axillary (!) 104 15 96 % -  01/31/18 1105 139/67 98 F (36.7 C) Axillary 89 16 91 % -  01/31/18 1022 137/62 99.1 F (37.3 C) Oral 88 17 95 % -     Recent laboratory studies:  Recent Labs    01/31/18 0519 02/01/18 0433  WBC 9.5 8.8  HGB 7.2* 10.4*  HCT 22.4* 33.1*  PLT 198 237     Discharge Medications:   Allergies as of 02/01/2018      Reactions   Amlodipine Rash   Prochlorperazine Edisylate Anaphylaxis   Compazine--- tongue swells and rash   Aspirin Other (See Comments)   nose bleeds   Cymbalta [duloxetine Hcl] Diarrhea, Nausea And Vomiting, Other (See Comments)   Increased blood pressure   Pamelor [nortriptyline Hcl] Diarrhea, Nausea Only   Increased Heart rate and BP      Medication List    TAKE these medications   Biotin 1000 MCG tablet Take 1,000 mcg by mouth daily.   CALCIUM 500 + D 500-125 MG-UNIT Tabs Generic drug:  Calcium Carbonate-Vitamin D Take 1 tablet by mouth daily.   gabapentin 300 MG capsule Commonly known as:  NEURONTIN TAKE '300MG'$  IN THE MORNING, '300MG'$  IN THE AFTERNOON, AND '600MG'$  AT BEDTIME. What changed:  See the new instructions.   GLUCOSAMINE CHONDR 1500 COMPLX PO Take 1 capsule by mouth daily.   HYDROcodone-acetaminophen 5-325 MG tablet Commonly known as:  NORCO/VICODIN Take 1 tablet by mouth every 4 (four) hours as needed for severe pain.   levothyroxine 88 MCG tablet Commonly known as:  SYNTHROID, LEVOTHROID TAKE 1 TABLET (88 MCG TOTAL) BY MOUTH DAILY BEFORE BREAKFAST. What changed:    when to take this  additional instructions   meloxicam 7.5 MG tablet Commonly known as:  MOBIC Take 7.5 mg by mouth 2 (two) times daily as needed for pain.    methocarbamol 500 MG tablet Commonly known as:  ROBAXIN Take 1 tablet (500 mg total) by mouth every 6 (six) hours as needed for muscle spasms.   multivitamin tablet Take 1 tablet by mouth daily.   omeprazole 20 MG capsule Commonly known as:  PRILOSEC TAKE 1 CAPSULE BY MOUTH 2 TIMES DAILY BEFORE A MEAL. What changed:  See the new instructions.   spironolactone 25 MG tablet Commonly known as:  ALDACTONE Take 12.5 mg by mouth every morning.   STOOL SOFTENER 100 MG capsule Generic drug:  docusate sodium Take  100 mg by mouth 2 (two) times daily.            Durable Medical Equipment  (From admission, onward)         Start     Ordered   01/24/18 2157  DME 3 n 1  Once     01/24/18 2156          Diagnostic Studies: Dg Pelvis Portable  Result Date: 01/24/2018 CLINICAL DATA:  Post total hip revision EXAM: PORTABLE PELVIS 1-2 VIEWS COMPARISON:  Intraoperative fluoroscopic radiographs dated 01/24/2017 FINDINGS: Status post relocation of the right hip arthroplasty. Mild irregularity of the greater trochanter. Associated soft tissue gas and overlying skin staples. IMPRESSION: Interval reduction of the right hip arthroplasty. Mild irregularity of the greater trochanter. Attention on follow-up is suggested. Electronically Signed   By: Julian Hy M.D.   On: 01/24/2018 20:48   Dg C-arm 1-60 Min-no Report  Result Date: 01/24/2018 Fluoroscopy was utilized by the requesting physician.  No radiographic interpretation.   Dg Hip Unilat W Or Wo Pelvis 1 View Right  Result Date: 01/02/2018 CLINICAL DATA:  Status post reduction of a right hip dislocation. EXAM: DG HIP (WITH OR WITHOUT PELVIS) 1V RIGHT COMPARISON:  None. FINDINGS: A single frontal view of the right hip demonstrates reduction of the previously seen dislocation of the femoral head component of the total hip prosthesis. The previously seen greater trochanter fracture is not visible on this image. Mild protrusio acetabuli is  again demonstrated. IMPRESSION: Status post reduction of the previously seen dislocation. The previously demonstrated greater trochanter fracture is not visible on this image. Electronically Signed   By: Claudie Revering M.D.   On: 01/02/2018 14:25   Dg Hip Port Unilat W Or Wo Pelvis 1 View Right  Result Date: 01/24/2018 CLINICAL DATA:  Attempted right hip dislocation reduction EXAM: DG HIP (WITH OR WITHOUT PELVIS) 1V PORT RIGHT COMPARISON:  None. FINDINGS: Right hip arthroplasty. Persistent right superior dislocation. Persistent ununited right greater trochanteric fracture noted and unchanged in the prior exam. No aggressive osseous lesion. IMPRESSION: 1. Right hip arthroplasty. Persistent right superior dislocation. Electronically Signed   By: Kathreen Devoid   On: 01/24/2018 12:33   Dg Hip Operative Unilat W Or W/o Pelvis Right  Result Date: 01/24/2018 CLINICAL DATA:  Right hip arthroplasty revision. EXAM: OPERATIVE RIGHT HIP (WITH PELVIS IF PERFORMED) 4 VIEWS TECHNIQUE: Fluoroscopic spot image(s) were submitted for interpretation post-operatively. COMPARISON:  01/24/2018 at 11:51 FINDINGS: Four images show a total right hip arthroplasty with the acetabular and femoral components well seated and well aligned. No acute fracture or evidence of an operative complication. IMPRESSION: Well-positioned total right hip arthroplasty. Electronically Signed   By: Lajean Manes M.D.   On: 01/24/2018 20:29   Dg Hip Unilat W Or Wo Pelvis 2-3 Views Right  Result Date: 01/24/2018 EXAM: DG HIP (WITH OR WITHOUT PELVIS) 2-3V RIGHT COMPARISON:  None. FINDINGS: Right total hip arthroplasty. Superior anterior right hip dislocation. No acute fracture. IMPRESSION: Right hip arthroplasty with superior anterior dislocation. Electronically Signed   By: Kathreen Devoid   On: 01/24/2018 10:55   Dg Hip Unilat W Or Wo Pelvis 2-3 Views Right  Result Date: 01/02/2018 CLINICAL DATA:  Right hip pain.  Suspect dislocation. EXAM: DG HIP (WITH  OR WITHOUT PELVIS) 2-3V RIGHT COMPARISON:  11/27/2017 FINDINGS: Superior dislocation of the total hip arthroplasty. Displaced fracture of the right femoral greater trochanter. Pelvic bony structures are intact. There appears to be severe dextroscoliosis in the  lumbar spine. No gross abnormality to the left hip joint. IMPRESSION: Dislocation of the right hip arthroplasty and displaced fracture of the right femur greater trochanter. Electronically Signed   By: Markus Daft M.D.   On: 01/02/2018 13:22   Xr Hip Unilat W Or W/o Pelvis 1v Right  Result Date: 01/07/2018 Standing low AP pelvis and lateral of the right hip shows a total hip arthroplasty in place with no complicating features.  The hip is located.   Disposition: Discharge disposition: [facility] [place]    [name], MD. Schedule an appointment as soon as possible for a visit in 2 week(s).   Specialty:  Orthopedic Surgery Contact information: [place] [address] [phone]            Signed: [name] 02/01/2018, 7:42 AM

## 2018-02-01 NOTE — Discharge Instructions (Signed)

## 2018-02-01 NOTE — Progress Notes (Signed)
Physical Therapy Treatment Patient Details Name: [name] MRN: [id number] DOB: 1930-08-17 Today's Date: 02/01/2018    History of Present Illness 84 y.o. female admitted with fall, dislocation of R anterior THA. s/p open reduction and revision of liner and ball. Pt had post op hypotension.  PMH: R DA-THA 2016, R hip revision 11/27/17, RA, lung cancer Sjorgrens syndrome.     PT Comments     patient continue to improve. Does complain of shoulders beginning to be sore.  Recommend heating pad if stays  In hospital. Continue PT.  Follow Up Recommendations  SNF     Equipment Recommendations  None recommended by PT    Recommendations for Other Services       Precautions / Restrictions Precautions Precautions: Fall Precaution Comments: No R hip ABDuction Restrictions RLE Weight Bearing: Weight bearing as tolerated    Mobility  Bed Mobility               General bed mobility comments: OOB  Transfers   Equipment used: Rolling walker (2 wheeled) Transfers: Sit to/from Stand Sit to Stand: Min assist         General transfer comment: required more assist from low surface of recliner and BSC, L shoukder pain limits.  Ambulation/Gait Ambulation/Gait assistance: Min assist Gait Distance (Feet): 130 Feet Assistive device: Rolling walker (2 wheeled) Gait Pattern/deviations: Step-to pattern;Step-through pattern     General Gait Details: HR 139, gait more antalgic on right today   Stairs             Wheelchair Mobility    Modified Rankin (Stroke Patients Only)       Balance                                            Cognition Arousal/Alertness: Awake/alert                                            Exercises      General Comments        Pertinent Vitals/Pain Pain Score: 3  Pain Location: R hip, thigh Pain Descriptors / Indicators: Sore Pain Intervention(s): Monitored during session;Premedicated  before session    Home Living                      Prior Function            PT Goals (current goals can now be found in the care plan section) Acute Rehab PT Goals Patient Stated Goal: go to rehab PT Goal Formulation: With patient Time For Goal Achievement: 02/08/18 Potential to Achieve Goals: Good Progress towards PT goals: Progressing toward goals(goals updated)    Frequency    Min 3X/week      PT Plan Discharge plan needs to be updated    Co-evaluation              AM-PAC PT "6 Clicks" Mobility   Outcome Measure  Help needed turning from your back to your side while in a flat bed without using bedrails?: A Lot Help needed moving from lying on your back to sitting on the side of a flat bed without using bedrails?: A Lot Help needed moving to and from a bed to a chair (including a  wheelchair)?: A Lot Help needed standing up from a chair using your arms (e.g., wheelchair or bedside chair)?: A Lot Help needed to walk in hospital room?: A Little Help needed climbing 3-5 steps with a railing? : Total 6 Click Score: 12    End of Session   Activity Tolerance: Patient tolerated treatment well Patient left: in chair;with call bell/phone within reach;with family/visitor present Nurse Communication: Mobility status PT Visit Diagnosis: Unsteadiness on feet (R26.81) Pain - Right/Left: Right Pain - part of body: Hip     Time: 1415-1446 PT Time Calculation (min) (ACUTE ONLY): 31 min  Charges:  $Gait Training: 8-22 mins $Self Care/Home Management: [place] Pager [phone] Office [phone]    [name] 02/01/2018, 2:56 PM

## 2018-02-01 NOTE — Progress Notes (Signed)
PROGRESS NOTE    [name]  XIP:[id number] DOB: [birth year] DOA: 01/24/2018 PCP: Darreld Mclean, MD   Brief Narrative:  84 year old with past medical history relevant for Sjogren's syndrome, lupus, hypothyroidism, chronic pain, erythromelalgia, non-small cell lung cancer status post resection, osteoporosis, osteoarthritis resented to the ED on 01/24/2018 after mechanical fall and found to have right hip dislocation with multiple failed attempts to relocate status post open reduction of hip with revision of lining and bowel on 01/24/2018 with postoperative course complicated by hypotension attributed to acute blood loss anemia as well as adrenal insufficiency.   Assessment & Plan:   Principal Problem:   Unstable right hip arthroplasty Active Problems:   Status post revision of total hip   History of revision of total replacement of right hip joint   Hypovolemic shock (HCC)   Hyperkalemia   Hyponatremia   #) Hypotension secondary to adrenal insufficiency:  Patient was placed on stress dose steroids.  Steroids have been rapidly tapered off.  Blood pressure is stable.    #) Acute blood loss anemia: -Most likely due to operative loss.  She has a bruise around her right flank and hip area.  Transfused 1 unit with good response in hemoglobin.    #) Status post right hip arthroplasty revision: Management per orthopedics  #) Hypothyroidism: - Continue levothyroxine 88 mcg -TSH is 2.776  #) NSVT: Patient reportedly has a history of NSVT and has had evaluation approximately 10 years ago that she reports unremarkable.  #) Osteoarthritis/osteoporosis: - Continue vitamin D and calcium  #) Pain/psych: -Continue gabapentin 300 mg every morning, 300 mg every afternoon, 600 mg nightly  #) Sjogren's syndrome/lupus: Patient apparently is not on any treatment for this.  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 Electrolytes: Monitor and  supplement Nutrition: Regular diet  Prophylaxis: Aspirin  Disposition: Plan is for her to go to skilled nursing facility for short-term rehab.  She remained stable from a medical standpoint.  Full code   Consultants:   Orthopedic surgery  PCCM  Procedures:   01/24/2018:OPEN REDUCTION OF DISLOCATED ANTERIOR HIP WITH REVISION OF LINER AND HIP BALL   Antimicrobials:   Perioperative antibiotics   Subjective: Patient feels much better this morning.  Denies any complaint.  Pain is well controlled.  Her daughter is at the bedside.  Objective: Vitals:   01/31/18 1335 01/31/18 2143 02/01/18 0500 02/01/18 0622  BP: (!) 154/75 125/62  133/65  Pulse: (!) 104 84  91  Resp: 15 16  16   Temp: 99.8 F (37.7 C) 98.8 F (37.1 C)  98.4 F (36.9 C)  TempSrc: Axillary Oral  Oral  SpO2: 96% 96%  97%  Weight:   56.8 kg   Height:        Intake/Output Summary (Last 24 hours) at 02/01/2018 1343 Last data filed at 02/01/2018 1000 Gross per 24 hour  Intake 480 ml  Output 1900 ml  Net -1420 ml   Filed Weights   01/29/18 0500 01/31/18 0627 02/01/18 0500  Weight: 58 kg 56.7 kg 56.8 kg    Examination:  Awake alert.  In no distress Lungs are clear to auscultation bilaterally.  No wheezing rales or rhonchi S1-S2 is mildly tachycardic regular.  No S3-S4. Abdomen is soft.  Nontender nondistended Bruising noted over the right flank area and the lateral thigh.    Data Reviewed: I have personally reviewed following labs and imaging studies  CBC: Recent Labs  Lab 01/26/18 0255  01/26/18 1257 01/27/18 0259 01/28/18 0258 01/29/18 0347 01/31/18 0519 02/01/18 0433  WBC 9.2 10.5 9.3 9.2 7.1 9.5 8.8  NEUTROABS 7.7 9.1*  --   --   --   --   --   HGB 6.8* 8.4* 8.1* 7.6* 7.6* 7.2* 10.4*  HCT 20.9* 25.5* 24.7* 23.5* 23.9* 22.4* 33.1*  MCV 91.7 92.1 88.8 91.1 93.7 92.9 86.9  PLT 93* 103* 101* 134* 161 198 161   Basic Metabolic Panel: Recent Labs  Lab 01/26/18 0255 01/28/18 0258   NA 133* 134*  K 4.7 3.7  CL 109 105  CO2 18* 23  GLUCOSE 130* 104*  BUN 31* 31*  CREATININE 1.49* 0.98  CALCIUM 7.5* 8.1*  MG  --  2.1   GFR: Estimated Creatinine Clearance: 33.2 mL/min (by C-G formula based on SCr of 0.98 mg/dL).  Thyroid Function Tests: Recent Labs    01/30/18 0855  TSH 2.776     Recent Results (from the past 240 hour(s))  MRSA PCR Screening     Status: Abnormal   Collection Time: 01/25/18  1:04 AM  Result Value Ref Range Status   MRSA by PCR POSITIVE (A) NEGATIVE Final    Comment:        The GeneXpert MRSA Assay (FDA approved for NASAL specimens only), is one component of a comprehensive MRSA colonization surveillance program. It is not intended to diagnose MRSA infection nor to guide or monitor treatment for MRSA infections. RESULT CALLED TO, READ BACK BY AND VERIFIED WITH: JACKSON,K RN @ 0225 ON 01/25/18 JACKSON,K Performed at [facility], [place] [address] [place], [place] [address]          Radiology Studies: No results found.      Scheduled Meds: . sodium chloride   Intravenous Once  . aspirin  81 mg Oral BID  . calcium-vitamin D  1 tablet Oral Q breakfast  . docusate sodium  100 mg Oral BID  . gabapentin  300 mg Oral BID  . gabapentin  600 mg Oral QHS  . levothyroxine  88 mcg Oral QHS  . mouth rinse  15 mL Mouth Rinse BID  . multivitamin with minerals  1 tablet Oral Daily  . pantoprazole  40 mg Oral Daily   Continuous Infusions: . methocarbamol (ROBAXIN) IV       LOS: 8 days     [facility]  If 7PM-7AM, please contact night-coverage [email or web address] Password [email or web address] 02/01/2018, 1:43 PM

## 2018-02-02 NOTE — Progress Notes (Signed)
Report called to [facility]. [facility] at bedside ready for transport. Patient ready for discharge.

## 2018-02-02 NOTE — Progress Notes (Signed)
Patient ID: [name], female   DOB: [birth year], 84 y.o.   MRN: [id number] No acute changes.  Was discharged yesterday, but SNF bed availability and issue as well as awaiting authorization.  Can be discharged today.  No change needed for discharge summary.

## 2018-02-02 NOTE — Plan of Care (Signed)
Pt A&O X 4 this shift. Pt ambulated to BR and tolerated well. Pain managed well with prescribed meds.

## 2018-02-02 NOTE — Clinical Social Work Placement (Signed)
D/C Summary sent.  Nurse call report to: [phone] PT[facility] arranged to pick up at  11:00am   CLINICAL SOCIAL WORK PLACEMENT  NOTE  Date:  02/02/2018  Patient Details  Name: [name] MRN: [id number] Date of Birth: 03/15/1930  Clinical Social Work is seeking post-discharge placement for this patient at the [place] level of care (*CSW will initial, date and re-position this form in  chart as items are completed):  Yes   Patient/family provided with [facility] Work Department's list of facilities offering this level of care within the geographic area requested by the patient (or if unable, by the patient's family).  Yes   Patient/family informed of their freedom to choose among providers that offer the needed level of care, that participate in Medicare, Medicaid or managed care program needed by the patient, have an available bed and are willing to accept the patient.  Yes   Patient/family informed of [facility]'s ownership interest in [facility] and [facility] as well as of the fact that they are under no obligation to receive care at these facilities.  PASRR submitted to EDS on       PASRR number received on       Existing PASRR number confirmed on 02/02/18     FL2 transmitted to all facilities in geographic area requested by pt/family on       FL2 transmitted to all facilities within larger geographic area on 01/30/18     Patient informed that his/her managed care company has contracts with or will negotiate with certain facilities, including the following:  [facility]     Yes   Patient/family informed of bed offers received.  Patient chooses bed at [facility]     Physician recommends and patient chooses bed at      Patient to be transferred to [facility] on 02/02/18.  Patient to be transferred to facility by PTAR     Patient family notified on 02/02/18 of transfer.  Name of family member notified:  Daughter      PHYSICIAN       Additional Comment:    _______________________________________________ Lia Hopping, LCSW 02/02/2018, 9:57 AM

## 2018-02-03 ENCOUNTER — Ambulatory Visit (INDEPENDENT_AMBULATORY_CARE_PROVIDER_SITE_OTHER): Payer: Medicare Other | Admitting: Orthopaedic Surgery

## 2018-02-07 ENCOUNTER — Encounter: Payer: Self-pay | Admitting: Family Medicine

## 2018-02-07 ENCOUNTER — Encounter (INDEPENDENT_AMBULATORY_CARE_PROVIDER_SITE_OTHER): Payer: Self-pay | Admitting: Orthopaedic Surgery

## 2018-02-08 ENCOUNTER — Telehealth (INDEPENDENT_AMBULATORY_CARE_PROVIDER_SITE_OTHER): Payer: Self-pay | Admitting: Orthopaedic Surgery

## 2018-02-08 ENCOUNTER — Other Ambulatory Visit (INDEPENDENT_AMBULATORY_CARE_PROVIDER_SITE_OTHER): Payer: Self-pay | Admitting: Orthopaedic Surgery

## 2018-02-08 MED ORDER — FUROSEMIDE 20 MG PO TABS: 20.0000 mg | ORAL_TABLET | Freq: Every day | ORAL | 0 refills | Status: DC

## 2018-02-08 NOTE — Progress Notes (Unsigned)
lasix °

## 2018-02-08 NOTE — Telephone Encounter (Signed)
Verbal order given  

## 2018-02-08 NOTE — Telephone Encounter (Signed)
[facility] PT  [phone]    Verbal orders   Two times a  week for one week   3 times a week for two weeks

## 2018-02-17 NOTE — Progress Notes (Addendum)
[place] [facility] [address] [place], [place] [address] [phone]  Date:  02/18/2018   Name:  [name]   DOB:  [birth year]   MRN:  [id number]  PCP:  Darreld Mclean, MD    Chief Complaint: Hospitalization Follow-up (hip dislocation) and Medication Management (weight gain, dose  adjustment of lasix?)   History of Present Illness:  [name] is a 85 y.o. very pleasant female patient who presents with the following:  History of osteopenia, inflammatory arthritis, Sjogren's syndrome, hyperlipidemia and hypertension.  Also history of lung cancer diagnosed in 2015, status post right upper lobectomy. Her oncologist is Dr. Earlie Server Her rheumatologist is Dr. Earnest Conroy with [facility]  Following up today after recent hospitalization She was admitted from 12/8 to 12/16 of 2019 She had a recent right hip replacement, but unfortunately the hip dislocated twice after a fall.  She had an open reduction of the hip dislocation in the operating room on 12/8. She then was admitted to the intensive care unit due to anemia and adrenal insufficiency.  She was on pressors for a time, required several blood transfusions.  She did well, and was discharged to skilled nursing rehab. She was discharged back to home this past Saturday Her daughter was visiting from [place], and another daughter lives nearby  She is doing home PT 3x a week Her daughter is pretty worried about her being alone as she has "found me on the floor twice now" She is not driving again yet but hopes to be eventually  She is not on any blood clot prophylaxis due to fall and bleeding risk  Today [name] notes that she is doing better, she is back at home She has a home health caregiver with her today Her daughter lives nearby, but she has been afraid to leave her at home due to recent falls. Her pain is not bad, and [name] feels that she is getting her strength  back  Need to check her blood counts, thyroid today She notes that she is on lasix 20 daily She feels like she is not urinating enough, and notes swelling in her right leg only.  This is the same leg where she had her recent surgery. She does not have a known history of heart failure.  She is trying to elevate this leg as much as she can, but is not able to walk around a whole lot right now  Wt Readings from Last 3 Encounters:  02/18/18 113 lb (51.3 kg)  02/01/18 125 lb 3.2 oz (56.8 kg)  01/02/18 104 lb (47.2 kg)   She considers her dry weight to be 105 She does not have any SOB or orthopnea  BP Readings from Last 3 Encounters:  02/18/18 124/70  02/02/18 140/75  01/02/18 (!) 154/76    Patient Active Problem List   Diagnosis Date Noted  . Unstable right hip arthroplasty 01/24/2018  . History of revision of total replacement of right hip joint 01/24/2018  . Hypovolemic shock ([place])   . Hyperkalemia   . Hyponatremia   . Failed total hip arthroplasty ([place]) 11/27/2017  . Status post revision of total hip 11/27/2017  . Elevated cholesterol 10/11/2015  . 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[place]) 10/04/2014  . Bilateral leg edema 08/01/2014  . Elevated BP 08/01/2014  . Dehydration 08/01/2014  . Rheumatoid arthritis involving multiple joints ([place]) 05/30/2014  . Osteoarthritis of right hip 04/28/2014  . Status post  total replacement of right hip 04/28/2014  . Long-term use of high-risk medication 11/11/2013  . Routine general medical examination at a health care facility 11/11/2013  . Anemia, unspecified 11/11/2013  . Neuropathic pain 07/07/2013  . Constipation due to pain medication 07/07/2013  . Protein-calorie malnutrition, severe ([place]) 06/08/2013  . Lung cancer, Right upper lobe 05/08/2013  . Anterior chest wall pain 09/18/2011  . Breast lump 09/18/2011  . Sciatica of right side 08/13/2011  . Sinusitis 06/13/2011  . Cerumen impaction 08/20/2010  . SINUSITIS - ACUTE-NOS 04/30/2010   . Osteopenia 03/07/2010  . PARESTHESIA 03/07/2010  . CONTUSION OF LOWER LEG 11/29/2009  . DIZZINESS 10/01/2009  . Backache 03/16/2009  . CT, CHEST, ABNORMAL 12/18/2008  . ABNORMAL ECHOCARDIOGRAM 12/14/2008  . URI 11/29/2008  . [place] SYNDROME 11/29/2008  . CONTACT DERMATITIS 11/02/2006  . HYPOGLYCEMIA 06/29/2006  . RAYNAUD'S DISEASE 06/29/2006    Past Medical History:  Diagnosis Date  . Arthralgia of multiple joints    followed by dr Gerilyn Nestle  . Arthritis   . Chronic constipation   . Chronic inflammatory arthritis    rhemotolgist-  dr a. Gerilyn Nestle [facility])  . Dry eyes    eye drops used   . GERD (gastroesophageal reflux disease)   . H/O discoid lupus erythematosus   . Hiatal hernia   . History of colon polyps   . Hypothyroidism   . Iron deficiency anemia   . LBBB (left bundle branch block) 2010  . Mitchell's disease (erythromelalgia) [facility])    neurologist-  dr patel  . Nocturia   . Non-small cell cancer of right lung [facility]) surgeon-- dr gerhardt/  oncologist-  dr Julien Nordmann--- per lov notes no recurrence/   11-18-2017 per pt denies any symptoms   dx 2015--  Stage IIA (T2b,N0,M0) , +EGFR  mutation in exon 21, non-small cell adenocarcinoma right upper lobe---  s/p  Right upper lobectomy , right middley wedge resection and node dissection---  no chemo or radiation therapy  . OA (osteoarthritis)    hands  . Osteoporosis   . PONV (postoperative nausea and vomiting)    likes phenergan  . Raynaud's phenomenon 1965  . Renal insufficiency   . Rheumatoid arthritis ([place])   . Sciatica   . Scoliosis   . Sjogren's syndrome ([place])   . Urgency of urination     Past Surgical History:  Procedure Laterality Date  . ANTERIOR HIP REVISION Right 11/27/2017   Procedure: RIGHT HIP ACETABULAR REVISION;  Surgeon: [name], MD;  Location: [facility];  Service: Orthopedics;  Laterality: Right;  . ANTERIOR HIP REVISION Right 01/24/2018   Procedure: OPEN REDUCTION OF  DISLOCATED ANTERIOR HIP WITH REVISION OF LINER AND HIP BALL;  Surgeon: [name], MD;  Location: [facility];  Service: Orthopedics;  Laterality: Right;  . APPENDECTOMY  1950s  . CARDIOVASCULAR STRESS TEST  12/2008    mild fixed basal to mid septal perfusion defect felt likely due to artifact from LBBB, no ischemia, EF 58%  . COLONOSCOPY    . LYMPH NODE DISSECTION Right 06/07/2013   Procedure: LYMPH NODE DISSECTION;  Surgeon: [name], MD;  Location: [place];  Service: Thoracic;  Laterality: Right;  . [place]   "large incision from chest to up to shoulder, the nerves were tied together, for raynaud's  . THORACOTOMY  06/07/2013   Procedure: MINI/LIMITED THORACOTOMY; right middle lobe wedge resection;  Surgeon: [name], MD;  Location: [facility];  Service: Thoracic;;  .  TONSILLECTOMY  child  . TOTAL ABDOMINAL HYSTERECTOMY  1980's    W/ BSO  . TOTAL HIP ARTHROPLASTY Right 04/28/2014   Procedure: RIGHT TOTAL HIP ARTHROPLASTY ANTERIOR APPROACH;  Surgeon: [name], MD;  Location: [facility];  Service: Orthopedics;  Laterality: Right;  . TRANSTHORACIC ECHOCARDIOGRAM  12/11/2008   ef 48-54%, grade 1 diastolic dysfunction/  mild LAE/  mild AR and MR/  trivial TR  . VIDEO ASSISTED THORACOSCOPY (VATS)/WEDGE RESECTION Right 06/07/2013   Procedure: VIDEO ASSISTED THORACOSCOPY (VATS)/right upper lobectomy, On Q;  Surgeon: [name], MD;  Location: [facility] [place];  Service: Thoracic;  Laterality: Right;  Marland Kitchen VIDEO BRONCHOSCOPY N/A 06/07/2013   Procedure: VIDEO BRONCHOSCOPY;  Surgeon: [name], MD;  Location[facility];  Service: Thoracic;  Laterality: N/A;  . VIDEO BRONCHOSCOPY WITH ENDOBRONCHIAL NAVIGATION N/A 05/04/2013   Procedure: VIDEO BRONCHOSCOPY WITH ENDOBRONCHIAL NAVIGATION;  Surgeon: [name], MD;  Location: [place];  Service: Thoracic;  Laterality: N/A;    Social History   Tobacco Use  . Smoking status: Never Smoker  . Smokeless  tobacco: Never Used  Substance Use Topics  . Alcohol use: Not Currently    Frequency: Never  . Drug use: Never    Family History  Problem Relation Age of Onset  . Coronary artery disease Father   . Colon cancer Father   . Diabetes Father   . Cancer Father        colon  . Other Mother 12       MVA  . Healthy Sister   . Other Brother        Killed on war  . Healthy Daughter   . Esophageal cancer Neg Hx   . Kidney disease Neg Hx   . Liver disease Neg Hx     Allergies  Allergen Reactions  . Amlodipine Rash  . Prochlorperazine Edisylate Anaphylaxis    Compazine--- tongue swells and rash  . Aspirin Other (See Comments)    nose bleeds  . Cymbalta [Duloxetine Hcl] Diarrhea, Nausea And Vomiting and Other (See Comments)    Increased blood pressure  . Pamelor [Nortriptyline Hcl] Diarrhea and Nausea Only    Increased Heart rate and BP    Medication list has been reviewed and updated.  Current Outpatient Medications on File Prior to Visit  Medication Sig Dispense Refill  . Biotin 1000 MCG tablet Take 1,000 mcg by mouth daily.     . Calcium Carbonate-Vitamin D (CALCIUM 500 + D) 500-125 MG-UNIT TABS Take 1 tablet by mouth daily.     Marland Kitchen docusate sodium (STOOL SOFTENER) 100 MG capsule Take 100 mg by mouth 2 (two) times daily.      . furosemide (LASIX) 20 MG tablet Take 1 tablet (20 mg total) by mouth daily. 30 tablet 0  . gabapentin (NEURONTIN) 300 MG capsule TAKE '300MG'$  IN THE MORNING, '300MG'$  IN THE AFTERNOON, AND '600MG'$  AT BEDTIME. (Patient taking differently: Take 300-600 mg by mouth See admin instructions. Take '300mg'$  in the morning, '300mg'$  in the afternoon, and '600mg'$  at bedtime.) 120 capsule 5  . Glucosamine-Chondroit-Vit C-Mn (GLUCOSAMINE CHONDR 1500 COMPLX PO) Take 1 capsule by mouth daily.     Marland Kitchen levothyroxine (SYNTHROID, LEVOTHROID) 88 MCG tablet TAKE 1 TABLET (88 MCG TOTAL) BY MOUTH DAILY BEFORE BREAKFAST. (Patient taking differently: Take 88 mcg by mouth at bedtime. Per pt takes at  10pm) 90 tablet 1  . meloxicam (MOBIC) 7.5 MG tablet Take 7.5 mg by mouth 2 (two) times daily as  needed for pain.    . methocarbamol (ROBAXIN) 500 MG tablet Take 1 tablet (500 mg total) by mouth every 6 (six) hours as needed for muscle spasms. 40 tablet 0  . Multiple Vitamin (MULTIVITAMIN) tablet Take 1 tablet by mouth daily.      Marland Kitchen omeprazole (PRILOSEC) 20 MG capsule TAKE 1 CAPSULE BY MOUTH 2 TIMES DAILY BEFORE A MEAL. (Patient taking differently: Take 20 mg by mouth 2 (two) times daily before a meal. TAKE 1 CAPSULE BY MOUTH 2 TIMES DAILY BEFORE A MEAL.) 180 capsule 1  . spironolactone (ALDACTONE) 25 MG tablet Take 12.5 mg by mouth every morning.   2  . HYDROcodone-acetaminophen (NORCO/VICODIN) 5-325 MG tablet Take 1 tablet by mouth every 4 (four) hours as needed for severe pain. (Patient not taking: Reported on 02/18/2018) 40 tablet 0   No current facility-administered medications on file prior to visit.     Review of Systems:  As per HPI- otherwise negative. No fever, appetite is ok  Rarely needs hydrocodone for pain    Physical Examination: Vitals:   02/18/18 1355  BP: 124/70  Pulse: 92  Resp: 16  SpO2: 98%   Vitals:   02/18/18 1355  Weight: 113 lb (51.3 kg)  Height: '5\' 2"'$  (1.575 m)   Body mass index is 20.67 kg/m. Ideal Body Weight: Weight in (lb) to have BMI = 25: 136.4  GEN: WDWN, NAD, Non-toxic, A & O x 3 looks well, petite build HEENT: Atraumatic, Normocephalic. Neck supple. No masses, No LAD. Ears and Nose: No external deformity. CV: RRR, No M/G/R. No JVD. No thrill. No extra heart sounds. PULM: CTA B, no wheezes, crackles, rhonchi. No retractions. No resp. distress. No accessory muscle use. EXTR: No c/c/e NEURO she is currently using a rolling walker PSYCH: Normally interactive. Conversant. Not depressed or anxious appearing.  Calm demeanor.  Patient is extremely sharp and has medical knowledge She has 1+ pitting edema of the right lower extremity, the left is  normal. She does have some mild erythema of the right lower calf.  It is minimally warm   Assessment and Plan Cellulitis of right leg - Plan: cephALEXin (KEFLEX) 500 MG capsule  Lower extremity edema - Plan: B Nat Peptide, Basic metabolic panel  Acute blood loss anemia - Plan: CBC  Acquired hypothyroidism - Plan: TSH  I will contact her via mychart when her labs come in  Following up today from recent hospital admission for hip dislocation.  She suffered some complications, and was in the ICU for a time.  She received multiple blood transfusions.  She is now doing much better and is back home, though she does still need some assistance from a home aide. We will check her labs as above, need to repeat blood counts today to see how she is recovering from her surgical blood losses. She is also due for thyroid recheck today. Await her labs, we may increase her Lasix a bit to help her clear the fluid from her leg. Encouraged her to continue elevating the leg, and to walk as she is able. Concern for developing cellulitis from stasis edema.  Will treat with Keflex twice a day for 1 week  I do not think her swelling and weight gain are likely due to CHF, but will check a BNP to be sure   Signed Lamar Blinks, MD  Received her labs and called her 1/3 I had not realized that she is taking potassium supplementation as well.  She is taking  20 meq once a day to go with her Lasix.  Advised her that she can stop taking this as potassium is borderline high Would not increase Lasix at this time due to her kidney function being reduced.  [name] is also drinking quite a bit of water, she will decrease her hydration in hopes of correcting her sodium.  Patient relates that she is actually going to have a metabolic profile drawn this coming Monday prior to a CT scan, and she will send those results to me then. Thyroid is okay, continue current medication. Hemoglobin is stable. Her BNP is in the middle  range.  We will set up an echocardiogram for her for next week to evaluate for possible CHF     Results for orders placed or performed in visit on 02/18/18  CBC  Result Value Ref Range   WBC 6.1 4.0 - 10.5 K/uL   RBC 3.53 (L) 3.87 - 5.11 Mil/uL   Platelets 283.0 150.0 - 400.0 K/uL   Hemoglobin 10.2 (L) 12.0 - 15.0 g/dL   HCT 30.9 (L) 36.0 - 46.0 %   MCV 87.3 78.0 - 100.0 fl   MCHC 33.0 30.0 - 36.0 g/dL   RDW 21.8 (H) 11.5 - 15.5 %  B Nat Peptide  Result Value Ref Range   Pro B Natriuretic peptide (BNP) 192.0 (H) 0.0 - 100.0 pg/mL  TSH  Result Value Ref Range   TSH 3.84 0.35 - 4.50 uIU/mL  Basic metabolic panel  Result Value Ref Range   Sodium 132 (L) 135 - 145 mEq/L   Potassium 5.0 3.5 - 5.1 mEq/L   Chloride 96 96 - 112 mEq/L   CO2 27 19 - 32 mEq/L   Glucose, Bld 87 70 - 99 mg/dL   BUN 40 (H) 6 - 23 mg/dL   Creatinine, Ser 1.46 (H) 0.40 - 1.20 mg/dL   Calcium 8.9 8.4 - 10.5 mg/dL   GFR 36.17 (L) >60.00 mL/min    Message to patient with a copy of her labs as well

## 2018-02-18 ENCOUNTER — Ambulatory Visit (INDEPENDENT_AMBULATORY_CARE_PROVIDER_SITE_OTHER): Payer: Medicare Other | Admitting: Family Medicine

## 2018-02-18 ENCOUNTER — Encounter: Payer: Self-pay | Admitting: Family Medicine

## 2018-02-18 VITALS — BP 124/70 | HR 92 | Resp 16 | Ht 62.0 in | Wt 113.0 lb

## 2018-02-18 DIAGNOSIS — L03115 Cellulitis of right lower limb: Secondary | ICD-10-CM

## 2018-02-18 DIAGNOSIS — D62 Acute posthemorrhagic anemia: Secondary | ICD-10-CM

## 2018-02-18 DIAGNOSIS — E039 Hypothyroidism, unspecified: Secondary | ICD-10-CM | POA: Diagnosis not present

## 2018-02-18 DIAGNOSIS — R7989 Other specified abnormal findings of blood chemistry: Secondary | ICD-10-CM

## 2018-02-18 DIAGNOSIS — R600 Localized edema: Secondary | ICD-10-CM

## 2018-02-18 LAB — BASIC METABOLIC PANEL
BUN: 40 mg/dL — AB (ref 6–23)
CO2: 27 mEq/L (ref 19–32)
CREATININE: 1.46 mg/dL — AB (ref 0.40–1.20)
Calcium: 8.9 mg/dL (ref 8.4–10.5)
Chloride: 96 mEq/L (ref 96–112)
GFR: 36.17 mL/min — ABNORMAL LOW (ref >60.00)
Glucose, Bld: 87 mg/dL (ref 70–99)
Potassium: 5 mEq/L (ref 3.5–5.1)
Sodium: 132 mEq/L — ABNORMAL LOW (ref 135–145)

## 2018-02-18 LAB — CBC
HEMATOCRIT: 30.9 % — AB (ref 36.0–46.0)
Hemoglobin: 10.2 g/dL — ABNORMAL LOW (ref 12.0–15.0)
MCHC: 33 g/dL (ref 30.0–36.0)
MCV: 87.3 fl (ref 78.0–100.0)
Platelets: 283 10*3/uL (ref 150.0–400.0)
RBC: 3.53 Mil/uL — ABNORMAL LOW (ref 3.87–5.11)
RDW: 21.8 % — ABNORMAL HIGH (ref 11.5–15.5)
WBC: 6.1 10*3/uL (ref 4.0–10.5)

## 2018-02-18 LAB — TSH: TSH: 3.84 u[IU]/mL (ref 0.35–4.50)

## 2018-02-18 LAB — BRAIN NATRIURETIC PEPTIDE: Pro B Natriuretic peptide (BNP): 192 pg/mL — ABNORMAL HIGH (ref 0.0–100.0)

## 2018-02-18 MED ORDER — CEPHALEXIN 500 MG PO CAPS: 500.0000 mg | ORAL_CAPSULE | Freq: Two times a day (BID) | ORAL | 0 refills | Status: DC

## 2018-02-18 NOTE — Patient Instructions (Signed)
It was great to see you today- I am so glad you are back home I will be in touch with your labs asap We may be able to go up on your lasix a bit for your swelling  I am also concerned that you might be getting cellulitis of the right leg. We are going to use keflex twice a day  for one week to try and get the redness out of your leg.   Please monitor this closely and let me know your progress

## 2018-02-19 ENCOUNTER — Encounter: Payer: Self-pay | Admitting: Family Medicine

## 2018-02-19 NOTE — Addendum Note (Signed)
Addended by: Lamar Blinks C on: 02/19/2018 12:34 PM   Modules accepted: Orders

## 2018-02-22 ENCOUNTER — Ambulatory Visit (INDEPENDENT_AMBULATORY_CARE_PROVIDER_SITE_OTHER): Payer: Medicare Other | Admitting: Orthopaedic Surgery

## 2018-02-22 ENCOUNTER — Encounter (INDEPENDENT_AMBULATORY_CARE_PROVIDER_SITE_OTHER): Payer: Self-pay | Admitting: Orthopaedic Surgery

## 2018-02-22 ENCOUNTER — Ambulatory Visit (HOSPITAL_COMMUNITY)
Admission: RE | Admit: 2018-02-22 | Discharge: 2018-02-22 | Disposition: A | Discharge status: Home / Self Care | Payer: Medicare Other | Source: Ambulatory Visit | Attending: Internal Medicine | Admitting: Internal Medicine

## 2018-02-22 ENCOUNTER — Inpatient Hospital Stay: Payer: Medicare Other | Attending: Family Medicine

## 2018-02-22 ENCOUNTER — Encounter (HOSPITAL_COMMUNITY): Payer: Self-pay

## 2018-02-22 DIAGNOSIS — Z96641 Presence of right artificial hip joint: Secondary | ICD-10-CM

## 2018-02-22 DIAGNOSIS — C3490 Malignant neoplasm of unspecified part of unspecified bronchus or lung: Secondary | ICD-10-CM

## 2018-02-22 DIAGNOSIS — Z85118 Personal history of other malignant neoplasm of bronchus and lung: Secondary | ICD-10-CM | POA: Diagnosis present

## 2018-02-22 DIAGNOSIS — D62 Acute posthemorrhagic anemia: Secondary | ICD-10-CM | POA: Insufficient documentation

## 2018-02-22 LAB — CBC WITH DIFFERENTIAL (CANCER CENTER ONLY)
Abs Immature Granulocytes: 0.04 10*3/uL (ref 0.00–0.07)
Basophils Absolute: 0 10*3/uL (ref 0.0–0.1)
Basophils Relative: 0 %
Eosinophils Absolute: 0.1 10*3/uL (ref 0.0–0.5)
Eosinophils Relative: 1 %
HCT: 25.7 % — ABNORMAL LOW (ref 36.0–46.0)
Hemoglobin: 8 g/dL — ABNORMAL LOW (ref 12.0–15.0)
Immature Granulocytes: 1 %
Lymphocytes Relative: 17 %
Lymphs Abs: 1.1 10*3/uL (ref 0.7–4.0)
MCH: 28.2 pg (ref 26.0–34.0)
MCHC: 31.1 g/dL (ref 30.0–36.0)
MCV: 90.5 fL (ref 80.0–100.0)
Monocytes Absolute: 1 10*3/uL (ref 0.1–1.0)
Monocytes Relative: 15 %
Neutro Abs: 4.1 10*3/uL (ref 1.7–7.7)
Neutrophils Relative %: 66 %
Platelet Count: 167 10*3/uL (ref 150–400)
RBC: 2.84 MIL/uL — ABNORMAL LOW (ref 3.87–5.11)
RDW: 20 % — ABNORMAL HIGH (ref 11.5–15.5)
WBC: 6.3 10*3/uL (ref 4.0–10.5)
nRBC: 0 % (ref 0.0–0.2)

## 2018-02-22 LAB — CMP (CANCER CENTER ONLY)
ALT: 11 U/L (ref 0–44)
AST: 23 U/L (ref 15–41)
Albumin: 3.5 g/dL (ref 3.5–5.0)
Alkaline Phosphatase: 88 U/L (ref 38–126)
Anion gap: 9 (ref 5–15)
BILIRUBIN TOTAL: 0.7 mg/dL (ref 0.3–1.2)
BUN: 29 mg/dL — ABNORMAL HIGH (ref 8–23)
CO2: 24 mmol/L (ref 22–32)
Calcium: 9 mg/dL (ref 8.9–10.3)
Chloride: 99 mmol/L (ref 98–111)
Creatinine: 1.44 mg/dL — ABNORMAL HIGH (ref 0.44–1.00)
GFR, EST NON AFRICAN AMERICAN: 33 mL/min — AB (ref >60)
GFR, Est AFR Am: 38 mL/min — ABNORMAL LOW (ref >60)
Glucose, Bld: 92 mg/dL (ref 70–99)
Potassium: 5.3 mmol/L — ABNORMAL HIGH (ref 3.5–5.1)
Sodium: 132 mmol/L — ABNORMAL LOW (ref 135–145)
Total Protein: 7 g/dL (ref 6.5–8.1)

## 2018-02-22 MED ORDER — DOXYCYCLINE HYCLATE 100 MG PO TABS: 100.0000 mg | ORAL_TABLET | Freq: Two times a day (BID) | ORAL | 0 refills | Status: DC

## 2018-02-22 MED ORDER — IOHEXOL 300 MG/ML  SOLN
75.0000 mL | Freq: Once | INTRAMUSCULAR | Status: AC | PRN
  Administered 2018-02-22: 60 mL via INTRAVENOUS

## 2018-02-22 MED ORDER — METHOCARBAMOL 500 MG PO TABS: 500.0000 mg | ORAL_TABLET | Freq: Four times a day (QID) | ORAL | 0 refills | Status: DC | PRN

## 2018-02-22 MED ORDER — SODIUM CHLORIDE (PF) 0.9 % IJ SOLN
INTRAMUSCULAR | Status: AC
  Filled 2018-02-22: qty 50

## 2018-02-22 NOTE — Progress Notes (Signed)
The patient is now close to 4 weeks status post revision of the right total hip arthroplasty.  She originally had failure of her acetabular component.  She has significant shortening of the leg and we reposition the acetabular component with a new one and a hip ball and liner exchange.  She then had a significant mechanical fall and dislocated her hip and fractured the greater trochanter.  We tried to treat this conservatively with time however she certainly had enough instability that she dislocated again after another hard mechanical fall slipping in her 4-year.  I took her to the operating room and revise the liner and the hip ball.  This is her first follow-up since then since she was convalescing in a nursing facility.  She is back home.  She is using a rolling walker.  She is 85 years old.  She has had some cellulitis in her lower extremities and her primary care physician put her on Keflex twice a day just this past Friday.  On examination of her right hip her incision looks good to remove the staples in place Steri-Strips.  She does have thigh swelling but no significant seroma to aspirate but rather just hematoma from the extent of her surgery.  At this point I will switch her from Keflex to doxycycline for this will be stronger for cellulitis.  She has been complaining of chronic left shoulder issues and wished for an injection her left shoulder today which I did place a steroid in her left subacromial space without difficulty.  All question concerns were answered and addressed.  We will see her back in 4 weeks to see how she is doing overall.

## 2018-02-24 ENCOUNTER — Encounter: Payer: Self-pay | Admitting: Internal Medicine

## 2018-02-24 ENCOUNTER — Encounter: Payer: Self-pay | Admitting: Family Medicine

## 2018-02-24 ENCOUNTER — Inpatient Hospital Stay (HOSPITAL_BASED_OUTPATIENT_CLINIC_OR_DEPARTMENT_OTHER): Payer: Medicare Other | Admitting: Internal Medicine

## 2018-02-24 ENCOUNTER — Telehealth: Payer: Self-pay | Admitting: Internal Medicine

## 2018-02-24 VITALS — BP 153/74 | HR 84 | Temp 98.3°F | Resp 20 | Ht 62.0 in | Wt 115.0 lb

## 2018-02-24 DIAGNOSIS — C3490 Malignant neoplasm of unspecified part of unspecified bronchus or lung: Secondary | ICD-10-CM

## 2018-02-24 DIAGNOSIS — C3411 Malignant neoplasm of upper lobe, right bronchus or lung: Secondary | ICD-10-CM

## 2018-02-24 DIAGNOSIS — D62 Acute posthemorrhagic anemia: Secondary | ICD-10-CM

## 2018-02-24 DIAGNOSIS — D638 Anemia in other chronic diseases classified elsewhere: Secondary | ICD-10-CM | POA: Insufficient documentation

## 2018-02-24 DIAGNOSIS — D539 Nutritional anemia, unspecified: Secondary | ICD-10-CM

## 2018-02-24 DIAGNOSIS — Z85118 Personal history of other malignant neoplasm of bronchus and lung: Secondary | ICD-10-CM

## 2018-02-24 NOTE — Telephone Encounter (Signed)
Patient declined avs, printed calendar per patient request.

## 2018-02-24 NOTE — Telephone Encounter (Signed)
Called patient back, she was quite surprised her hemoglobin dropped from 10-8 over the span of a few days.  She had declined a blood transfusion at that time, wish to discuss with me.  She has not noticed any blood in her stool, and has not had any other major bleeding thta she has noticed  She has not had a major bruising or swelling around her hip surgery site, and reports that she just saw orthopedics to have her stitches out.  They do have any concerns at her hip.  She plans to come in tomorrow for a repeat CBC, in case there was a lab error.  We will also have her do stool cards to screen for any blood in stool.  If her hemoglobin is the same or lower, may need to go ahead with blood transfusion

## 2018-02-24 NOTE — Progress Notes (Signed)
[place] [phone]   [phone]  OFFICE PROGRESS NOTE  Copland, Gay Filler, MD [address] [place] [address]  DIAGNOSIS: Stage IIA (T2b, N0, M0) non-small cell lung cancer, adenocarcinoma with positive EGFR mutation in exon 21 ([id number]) presented with right upper lobe lung mass diagnosed in March of 2015.  PRIOR THERAPY: Status post right upper lobectomy with wedge resection of the right middle lobe under the care of Dr. Servando Snare on 06/07/2013  CURRENT THERAPY: Observation.  INTERVAL HISTORY: [name] 85 y.o. female returns to the clinic today for 6 months follow-up visit.  The patient is feeling fine today with no concerning complaints except for mild fatigue.  She had hip fracture recently and underwent internal fixation by Dr. Ninfa Linden.  She lost a lot of blood during this procedure and she received 5 units of PRBCs transfusion during her hospitalization.  She denied having any chest pain, shortness of breath, cough or hemoptysis.  She denied having any fever or chills.  She has no nausea, vomiting, diarrhea or constipation.  She had repeat CT scan of the chest performed recently and she is here for evaluation and discussion of her scan results.   MEDICAL HISTORY: Past Medical History:  Diagnosis Date  . Arthralgia of multiple joints    followed by dr Gerilyn Nestle  . Arthritis   . Chronic constipation   . Chronic inflammatory arthritis    rhemotolgist-  dr a. Gerilyn Nestle [facility])  . Dry eyes    eye drops used   . GERD (gastroesophageal reflux disease)   . H/O discoid lupus erythematosus   . Hiatal hernia   . History of colon polyps   . Hypothyroidism   . Iron deficiency anemia   . LBBB (left bundle branch block) 2010  . Mitchell's disease (erythromelalgia) [facility])    neurologist-  dr patel  . Nocturia   . Non-small cell cancer of right lung [facility]) surgeon-- dr gerhardt/  oncologist-  dr Julien Nordmann--- per  lov notes no recurrence/   11-18-2017 per pt denies any symptoms   dx 2015--  Stage IIA (T2b,N0,M0) , +EGFR  mutation in exon 21, non-small cell adenocarcinoma right upper lobe---  s/p  Right upper lobectomy , right middley wedge resection and node dissection---  no chemo or radiation therapy  . OA (osteoarthritis)    hands  . Osteoporosis   . PONV (postoperative nausea and vomiting)    likes phenergan  . Raynaud's phenomenon 1965  . Renal insufficiency   . Rheumatoid arthritis ([name])   . Sciatica   . Scoliosis   . Sjogren's syndrome ([place])   . Urgency of urination     ALLERGIES:  is allergic to amlodipine; prochlorperazine edisylate; aspirin; cymbalta [duloxetine hcl]; and pamelor [nortriptyline hcl].  MEDICATIONS:  Current Outpatient Medications  Medication Sig Dispense Refill  . Biotin 1000 MCG tablet Take 1,000 mcg by mouth daily.     . Calcium Carbonate-Vitamin D (CALCIUM 500 + D) 500-125 MG-UNIT TABS Take 1 tablet by mouth daily.     . cephALEXin (KEFLEX) 500 MG capsule Take 1 capsule (500 mg total) by mouth 2 (two) times daily. 14 capsule 0  . docusate sodium (STOOL SOFTENER) 100 MG capsule Take 100 mg by mouth 2 (two) times daily.      Marland Kitchen doxycycline (VIBRA-TABS) 100 MG tablet Take 1 tablet (100 mg total) by mouth 2 (two) times daily. 30 tablet 0  .  furosemide (LASIX) 20 MG tablet Take 1 tablet (20 mg total) by mouth daily. 30 tablet 0  . gabapentin (NEURONTIN) 300 MG capsule TAKE 300MG IN THE MORNING, 300MG IN THE AFTERNOON, AND 600MG AT BEDTIME. (Patient taking differently: Take 300-600 mg by mouth See admin instructions. Take 372m in the morning, 3053min the afternoon, and 60027mt bedtime.) 120 capsule 5  . Glucosamine-Chondroit-Vit C-Mn (GLUCOSAMINE CHONDR 1500 COMPLX PO) Take 1 capsule by mouth daily.     . HMarland KitchenDROcodone-acetaminophen (NORCO/VICODIN) 5-325 MG tablet Take 1 tablet by mouth every 4 (four) hours as needed for severe pain. (Patient not taking: Reported on  02/18/2018) 40 tablet 0  . levothyroxine (SYNTHROID, LEVOTHROID) 88 MCG tablet TAKE 1 TABLET (88 MCG TOTAL) BY MOUTH DAILY BEFORE BREAKFAST. (Patient taking differently: Take 88 mcg by mouth at bedtime. Per pt takes at 10pm) 90 tablet 1  . meloxicam (MOBIC) 7.5 MG tablet Take 7.5 mg by mouth 2 (two) times daily as needed for pain.    . methocarbamol (ROBAXIN) 500 MG tablet Take 1 tablet (500 mg total) by mouth every 6 (six) hours as needed for muscle spasms. 40 tablet 0  . Multiple Vitamin (MULTIVITAMIN) tablet Take 1 tablet by mouth daily.      . oMarland Kitcheneprazole (PRILOSEC) 20 MG capsule TAKE 1 CAPSULE BY MOUTH 2 TIMES DAILY BEFORE A MEAL. (Patient taking differently: Take 20 mg by mouth 2 (two) times daily before a meal. TAKE 1 CAPSULE BY MOUTH 2 TIMES DAILY BEFORE A MEAL.) 180 capsule 1  . spironolactone (ALDACTONE) 25 MG tablet Take 12.5 mg by mouth every morning.   2   No current facility-administered medications for this visit.     SURGICAL HISTORY:  Past Surgical History:  Procedure Laterality Date  . ANTERIOR HIP REVISION Right 11/27/2017   Procedure: RIGHT HIP ACETABULAR REVISION;  Surgeon: BlaMcarthur RossettiD;  Location: [facility];  Service: Orthopedics;  Laterality: Right;  . ANTERIOR HIP REVISION Right 01/24/2018   Procedure: OPEN REDUCTION OF DISLOCATED ANTERIOR HIP WITH REVISION OF LINER AND HIP BALL;  Surgeon: BlaMcarthur RossettiD;  Location: [facility];  Service: Orthopedics;  Laterality: Right;  . APPENDECTOMY  1950s  . CARDIOVASCULAR STRESS TEST  12/2008    mild fixed basal to mid septal perfusion defect felt likely due to artifact from LBBB, no ischemia, EF 58%  . COLONOSCOPY    . LYMPH NODE DISSECTION Right 06/07/2013   Procedure: LYMPH NODE DISSECTION;  Surgeon: EdwGrace IsaacD;  [facility];  Laterality: Right;  . THOWiseman"large incision from chest to up to shoulder, the nerves were tied together, for raynaud's  .  THORACOTOMY  06/07/2013   Procedure: MINI/LIMITED THORACOTOMY; right middle lobe wedge resection;  Surgeon: EdwGrace IsaacD;  [facility];;  . TONSILLECTOMY  child  . TOTAL ABDOMINAL HYSTERECTOMY  1980's    W/ BSO  . TOTAL HIP ARTHROPLASTY Right 04/28/2014   Procedure: RIGHT TOTAL HIP ARTHROPLASTY ANTERIOR APPROACH;  Surgeon: ChrMcarthur RossettiD;  Location: [facility];  Service: Orthopedics;  Laterality: Right;  . TRANSTHORACIC ECHOCARDIOGRAM  12/11/2008   ef 45-56-81%rade 1 diastolic dysfunction/  mild LAE/  mild AR and MR/  trivial TR  . VIDEO ASSISTED THORACOSCOPY (VATS)/WEDGE RESECTION Right 06/07/2013   Procedure: VIDEO ASSISTED THORACOSCOPY (VATS)/right upper lobectomy, On Q;  Surgeon: EdwGrace IsaacD;  [facility] [place];  Laterality:  Right;  Marland Kitchen VIDEO BRONCHOSCOPY N/A 06/07/2013   Procedure: VIDEO BRONCHOSCOPY;  Surgeon: [name], MD;  Location[facility];  Service: Thoracic;  Laterality: N/A;  . VIDEO BRONCHOSCOPY WITH ENDOBRONCHIAL NAVIGATION N/A 05/04/2013   Procedure: VIDEO BRONCHOSCOPY WITH ENDOBRONCHIAL NAVIGATION;  Surgeon: [name], MD;  Location: [place];  Service: Thoracic;  Laterality: N/A;    REVIEW OF SYSTEMS:  Constitutional: positive for fatigue Eyes: negative Ears, nose, mouth, throat, and face: negative Respiratory: negative Cardiovascular: negative Gastrointestinal: negative Genitourinary:negative Integument/breast: negative Hematologic/lymphatic: negative Musculoskeletal:negative Neurological: negative Behavioral/Psych: negative Endocrine: negative Allergic/Immunologic: negative   PHYSICAL EXAMINATION: General appearance: alert, cooperative and no distress Head: Normocephalic, without obvious abnormality, atraumatic Neck: no adenopathy, no JVD, supple, symmetrical, trachea midline and thyroid not enlarged, symmetric, no tenderness/mass/nodules Lymph nodes: Cervical, supraclavicular, and axillary  nodes normal. Resp: clear to auscultation bilaterally Back: symmetric, no curvature. ROM normal. No CVA tenderness. Cardio: regular rate and rhythm, S1, S2 normal, no murmur, click, rub or gallop GI: soft, non-tender; bowel sounds normal; no masses,  no organomegaly Extremities: extremities normal, atraumatic, no cyanosis or edema Neurologic: Alert and oriented X 3, normal strength and tone. Normal symmetric reflexes. Normal coordination and gait  ECOG PERFORMANCE STATUS: 1 - Symptomatic but completely ambulatory  Blood pressure (!) 153/74, pulse 84, temperature 98.3 F (36.8 C), temperature source Oral, resp. rate 20, height 5' 2" (1.575 m), weight 115 lb (52.2 kg), SpO2 100 %.  LABORATORY DATA: Lab Results  Component Value Date   WBC 6.3 02/22/2018   HGB 8.0 (L) 02/22/2018   HCT 25.7 (L) 02/22/2018   MCV 90.5 02/22/2018   PLT 167 02/22/2018      Chemistry      Component Value Date/Time   NA 132 (L) 02/22/2018 1017   NA 130 (L) 08/21/2016 1125   K 5.3 (H) 02/22/2018 1017   K 4.8 08/21/2016 1125   CL 99 02/22/2018 1017   CO2 24 02/22/2018 1017   CO2 26 08/21/2016 1125   BUN 29 (H) 02/22/2018 1017   BUN 25.6 08/21/2016 1125   CREATININE 1.44 (H) 02/22/2018 1017   CREATININE 1.27 (H) 06/01/2017 1353   CREATININE 1.2 (H) 08/21/2016 1125      Component Value Date/Time   CALCIUM 9.0 02/22/2018 1017   CALCIUM 10.0 08/21/2016 1125   ALKPHOS 88 02/22/2018 1017   ALKPHOS 54 08/21/2016 1125   AST 23 02/22/2018 1017   AST 27 08/21/2016 1125   ALT 11 02/22/2018 1017   ALT 13 08/21/2016 1125   BILITOT 0.7 02/22/2018 1017   BILITOT 0.31 08/21/2016 1125       RADIOGRAPHIC STUDIES: Ct Chest W Contrast  Result Date: 02/22/2018 CLINICAL DATA:  Stage IIA right upper lobe lung adenocarcinoma status post right upper lobectomy and right middle lobe wedge resection 06/07/2013. Restaging. EXAM: CT CHEST WITH CONTRAST TECHNIQUE: Multidetector CT imaging of the chest was performed  during intravenous contrast administration. CONTRAST:  67m OMNIPAQUE IOHEXOL 300 MG/ML  SOLN COMPARISON:  08/21/2017 chest CT. FINDINGS: Cardiovascular: Normal heart size. No significant pericardial effusion/thickening. Atherosclerotic nonaneurysmal thoracic aorta. Normal caliber pulmonary arteries. No central pulmonary emboli. Mediastinum/Nodes: No discrete thyroid nodules. Unremarkable esophagus. No pathologically enlarged axillary, mediastinal or hilar lymph nodes. Lungs/Pleura: No pneumothorax. No pleural effusion. Stable postsurgical changes from right upper lobectomy and right middle lobe wedge resection. No acute consolidative airspace disease, lung masses or significant pulmonary nodules. Upper abdomen: Moderate hiatal hernia. Musculoskeletal:  No aggressive appearing focal osseous lesions. IMPRESSION: 1. No  evidence of metastatic disease in the chest. 2. Stable postsurgical changes from right upper lobectomy and right middle lobe wedge resection with no evidence of local tumor recurrence. 3. Moderate hiatal hernia. Aortic Atherosclerosis (ICD10-I70.0). Electronically Signed   By: Ilona Sorrel M.D.   On: 02/22/2018 14:07   ASSESSMENT AND PLAN:  This is a very pleasant 85 years old white female with stage II a non-small cell lung cancer, adenocarcinoma with positive EGFR mutation in exon 21 status post right upper lobectomy as well as wedge resection of the right middle lobe and the patient declined adjuvant systemic chemotherapy. The patient has been on observation for the last 5 years.  She is doing fine with no concerning complaints. Repeat CT scan of the chest showed no concerning findings for disease recurrence or progression. I discussed the scan results with the patient and recommended for her to continue on observation with repeat CT scan of the chest in 1 year. For the anemia of blood loss secondary to her recent hip surgery, I will arrange for the patient to receive 2 units of PRBCs  transfusion in the next few days.  I also recommended for the patient to take oral iron tablets at regular basis until improvement of her anemia. The patient will come back for follow-up visit in 1 year. She was advised to call immediately if she has any concerning symptoms in the interval. The patient voices understanding of current disease status and treatment options and is in agreement with the current care plan. All questions were answered. The patient knows to call the clinic with any problems, questions or concerns. We can certainly see the patient much sooner if necessary.  Disclaimer: This note was dictated with voice recognition software. Similar sounding words can inadvertently be transcribed and may not be corrected upon review.

## 2018-02-25 ENCOUNTER — Other Ambulatory Visit (INDEPENDENT_AMBULATORY_CARE_PROVIDER_SITE_OTHER): Payer: Medicare Other

## 2018-02-25 ENCOUNTER — Telehealth: Payer: Self-pay | Admitting: *Deleted

## 2018-02-25 ENCOUNTER — Encounter: Payer: Self-pay | Admitting: Family Medicine

## 2018-02-25 DIAGNOSIS — D539 Nutritional anemia, unspecified: Secondary | ICD-10-CM | POA: Diagnosis not present

## 2018-02-25 LAB — CBC
HEMATOCRIT: 27.3 % — AB (ref 36.0–46.0)
Hemoglobin: 9 g/dL — ABNORMAL LOW (ref 12.0–15.0)
MCHC: 32.8 g/dL (ref 30.0–36.0)
MCV: 87.4 fl (ref 78.0–100.0)
Platelets: 241 10*3/uL (ref 150.0–400.0)
RBC: 3.13 Mil/uL — ABNORMAL LOW (ref 3.87–5.11)
RDW: 21.1 % — ABNORMAL HIGH (ref 11.5–15.5)
WBC: 6.4 10*3/uL (ref 4.0–10.5)

## 2018-02-25 NOTE — Telephone Encounter (Signed)
Stool cards given to pt at lab appt.

## 2018-02-25 NOTE — Telephone Encounter (Signed)
Pt is on lab schedule for CBC and POCT occult stool card today at 11. We do not do stool cards in the lab. We can give pt the IFOB pack which has 3 cards in it and she can take it home to collect the samples then mail it back to the lab on [facility]. Is it ok to change the order to IFOB?

## 2018-02-25 NOTE — Telephone Encounter (Signed)
I left stool cards up front for her to pick up

## 2018-02-26 ENCOUNTER — Other Ambulatory Visit: Payer: Self-pay

## 2018-02-26 ENCOUNTER — Ambulatory Visit (HOSPITAL_COMMUNITY): Payer: Medicare Other | Attending: Cardiovascular Disease

## 2018-02-26 DIAGNOSIS — R7989 Other specified abnormal findings of blood chemistry: Secondary | ICD-10-CM | POA: Insufficient documentation

## 2018-02-26 DIAGNOSIS — R600 Localized edema: Secondary | ICD-10-CM | POA: Diagnosis present

## 2018-03-01 ENCOUNTER — Encounter: Payer: Self-pay | Admitting: Family Medicine

## 2018-03-01 DIAGNOSIS — D62 Acute posthemorrhagic anemia: Secondary | ICD-10-CM

## 2018-03-01 DIAGNOSIS — I5042 Chronic combined systolic (congestive) and diastolic (congestive) heart failure: Secondary | ICD-10-CM

## 2018-03-02 ENCOUNTER — Other Ambulatory Visit (INDEPENDENT_AMBULATORY_CARE_PROVIDER_SITE_OTHER): Payer: Medicare Other

## 2018-03-02 ENCOUNTER — Other Ambulatory Visit: Payer: Self-pay | Admitting: Emergency Medicine

## 2018-03-02 ENCOUNTER — Other Ambulatory Visit: Payer: Medicare Other

## 2018-03-02 ENCOUNTER — Other Ambulatory Visit (INDEPENDENT_AMBULATORY_CARE_PROVIDER_SITE_OTHER): Payer: Self-pay | Admitting: Orthopaedic Surgery

## 2018-03-02 DIAGNOSIS — D638 Anemia in other chronic diseases classified elsewhere: Secondary | ICD-10-CM

## 2018-03-02 LAB — FECAL OCCULT BLOOD, IMMUNOCHEMICAL: Fecal Occult Bld: POSITIVE — AB

## 2018-03-03 ENCOUNTER — Telehealth: Payer: Self-pay | Admitting: Family Medicine

## 2018-03-03 DIAGNOSIS — D62 Acute posthemorrhagic anemia: Secondary | ICD-10-CM

## 2018-03-03 NOTE — Telephone Encounter (Signed)
Call patient.  Her stool was positive for blood.  She has not seen any blood in her stool, note that her stools may be dark but she attributes this to eating prunes.  She has had a colonoscopy but it has been many years.  She does not currently have a gastroenterologist.  Will refer to our GI doc here at the med center to help with the next step.  We are not sure if a colonoscopy indicated at her age.  She does plan to repeat her blood count in about 2 weeks

## 2018-03-04 ENCOUNTER — Telehealth: Payer: Self-pay | Admitting: Gastroenterology

## 2018-03-04 NOTE — Telephone Encounter (Signed)
OK 

## 2018-03-09 ENCOUNTER — Encounter: Payer: Self-pay | Admitting: Family Medicine

## 2018-03-10 NOTE — Telephone Encounter (Signed)
Pt is scheduled for 03-26-2018 with Dr. Bryan Lemma

## 2018-03-12 ENCOUNTER — Encounter: Payer: Self-pay | Admitting: Family Medicine

## 2018-03-12 ENCOUNTER — Other Ambulatory Visit (INDEPENDENT_AMBULATORY_CARE_PROVIDER_SITE_OTHER): Payer: Medicare Other

## 2018-03-12 DIAGNOSIS — D62 Acute posthemorrhagic anemia: Secondary | ICD-10-CM | POA: Diagnosis not present

## 2018-03-12 LAB — CBC
HCT: 30.1 % — ABNORMAL LOW (ref 36.0–46.0)
Hemoglobin: 10 g/dL — ABNORMAL LOW (ref 12.0–15.0)
MCHC: 33 g/dL (ref 30.0–36.0)
MCV: 89.3 fl (ref 78.0–100.0)
Platelets: 239 10*3/uL (ref 150.0–400.0)
RBC: 3.38 Mil/uL — ABNORMAL LOW (ref 3.87–5.11)
RDW: 19.4 % — ABNORMAL HIGH (ref 11.5–15.5)
WBC: 5.4 10*3/uL (ref 4.0–10.5)

## 2018-03-19 ENCOUNTER — Encounter: Payer: Self-pay | Admitting: Family Medicine

## 2018-03-20 ENCOUNTER — Encounter: Payer: Self-pay | Admitting: Family Medicine

## 2018-03-22 NOTE — Progress Notes (Signed)
Referring-Jessica Copland, MD Reason for referral-CHF  HPI: 85 year old female for evaluation of congestive heart failure at request of Silvestre Mesi, MD. Patient seen previously but not since February 2016.  Echocardiogram October 2010 showed ejection fraction 45 to 95%, grade 1 diastolic dysfunction, mild aortic insufficiency and mild mitral regurgitation.  Nuclear study 2010 showed ejection fraction 58% with fixed septal defect related to left bundle branch block and no ischemia.  Echocardiogram January 2020 showed ejection fraction 40 to 28%, grade 1 diastolic dysfunction, mild aortic insufficiency and mild to moderate mitral regurgitation.  Patient had hip replacement in December 2019.  Procedure was complicated by bleeding and required transfusions.  She did develop right lower extremity edema after the hip replacement.  Echocardiogram as outlined above and cardiology asked to evaluate.  She has mild dyspnea on exertion but no orthopnea or PND.  No chest pain, palpitations or syncope.  She does have edema but only in the right lower extremity which was where the hip was replaced.  Current Outpatient Medications  Medication Sig Dispense Refill  . Biotin 1000 MCG tablet Take 1,000 mcg by mouth daily.     . Calcium Carbonate-Vitamin D (CALCIUM 500 + D) 500-125 MG-UNIT TABS Take 1 tablet by mouth daily.     . cephALEXin (KEFLEX) 500 MG capsule Take 1 capsule (500 mg total) by mouth 2 (two) times daily. 14 capsule 0  . docusate sodium (STOOL SOFTENER) 100 MG capsule Take 100 mg by mouth 2 (two) times daily.      Marland Kitchen doxycycline (VIBRA-TABS) 100 MG tablet Take 1 tablet (100 mg total) by mouth 2 (two) times daily. 30 tablet 0  . furosemide (LASIX) 20 MG tablet TAKE 1 TABLET BY MOUTH EVERY DAY 30 tablet 0  . gabapentin (NEURONTIN) 300 MG capsule TAKE '300MG'$  IN THE MORNING, '300MG'$  IN THE AFTERNOON, AND '600MG'$  AT BEDTIME. (Patient taking differently: Take 300-600 mg by mouth See admin instructions. Take  '300mg'$  in the morning, '300mg'$  in the afternoon, and '600mg'$  at bedtime.) 120 capsule 5  . Glucosamine-Chondroit-Vit C-Mn (GLUCOSAMINE CHONDR 1500 COMPLX PO) Take 1 capsule by mouth daily.     Marland Kitchen HYDROcodone-acetaminophen (NORCO/VICODIN) 5-325 MG tablet Take 1 tablet by mouth every 4 (four) hours as needed for severe pain. 40 tablet 0  . levothyroxine (SYNTHROID, LEVOTHROID) 88 MCG tablet TAKE 1 TABLET (88 MCG TOTAL) BY MOUTH DAILY BEFORE BREAKFAST. (Patient taking differently: Take 88 mcg by mouth at bedtime. Per pt takes at 10pm) 90 tablet 1  . meloxicam (MOBIC) 7.5 MG tablet Take 7.5 mg by mouth 2 (two) times daily as needed for pain.    . methocarbamol (ROBAXIN) 500 MG tablet Take 1 tablet (500 mg total) by mouth every 6 (six) hours as needed for muscle spasms. 40 tablet 0  . Multiple Vitamin (MULTIVITAMIN) tablet Take 1 tablet by mouth daily.      Marland Kitchen omeprazole (PRILOSEC) 20 MG capsule TAKE 1 CAPSULE BY MOUTH 2 TIMES DAILY BEFORE A MEAL. (Patient taking differently: Take 20 mg by mouth 2 (two) times daily before a meal. TAKE 1 CAPSULE BY MOUTH 2 TIMES DAILY BEFORE A MEAL.) 180 capsule 1  . spironolactone (ALDACTONE) 25 MG tablet Take 12.5 mg by mouth every morning.   2  . carvedilol (COREG) 3.125 MG tablet Take 1 tablet (3.125 mg total) by mouth 2 (two) times daily. 180 tablet 3   No current facility-administered medications for this visit.     Allergies  Allergen Reactions  . Amlodipine  Rash  . Prochlorperazine Edisylate Anaphylaxis    Compazine--- tongue swells and rash  . Aspirin Other (See Comments)    nose bleeds  . Cymbalta [Duloxetine Hcl] Diarrhea, Nausea And Vomiting and Other (See Comments)    Increased blood pressure  . Pamelor [Nortriptyline Hcl] Diarrhea and Nausea Only    Increased Heart rate and BP     Past Medical History:  Diagnosis Date  . Arthralgia of multiple joints    followed by dr Gerilyn Nestle  . Arthritis   . Cardiomyopathy ([place])   . Chronic constipation   .  Chronic inflammatory arthritis    rhemotolgist-  dr a. Gerilyn Nestle [name])  . Dry eyes    eye drops used   . GERD (gastroesophageal reflux disease)   . H/O discoid lupus erythematosus   . Hiatal hernia   . History of colon polyps   . Hypothyroidism   . Iron deficiency anemia   . LBBB (left bundle branch block) 2010  . Mitchell's disease (erythromelalgia) [facility])    neurologist-  dr patel  . Nocturia   . Non-small cell cancer of right lung [facility]) surgeon-- dr gerhardt/  oncologist-  dr Julien Nordmann--- per lov notes no recurrence/   11-18-2017 per pt denies any symptoms   dx 2015--  Stage IIA (T2b,N0,M0) , +EGFR  mutation in exon 21, non-small cell adenocarcinoma right upper lobe---  s/p  Right upper lobectomy , right middley wedge resection and node dissection---  no chemo or radiation therapy  . OA (osteoarthritis)    hands  . Osteoporosis   . PONV (postoperative nausea and vomiting)    likes phenergan  . Raynaud's phenomenon 1965  . Renal insufficiency   . Rheumatoid arthritis ([place])   . Sciatica   . Scoliosis   . Sjogren's syndrome [facility])     Past Surgical History:  Procedure Laterality Date  . ANTERIOR HIP REVISION Right 11/27/2017   Procedure: RIGHT HIP ACETABULAR REVISION;  Surgeon: [name], MD;  Location: [facility];  Service: Orthopedics;  Laterality: Right;  . ANTERIOR HIP REVISION Right 01/24/2018   Procedure: OPEN REDUCTION OF DISLOCATED ANTERIOR HIP WITH REVISION OF LINER AND HIP BALL;  Surgeon: [name], MD;  Location: [facility];  Service: Orthopedics;  Laterality: Right;  . APPENDECTOMY  1950s  . CARDIOVASCULAR STRESS TEST  12/2008    mild fixed basal to mid septal perfusion defect felt likely due to artifact from LBBB, no ischemia, EF 58%  . COLONOSCOPY    . LYMPH NODE DISSECTION Right 06/07/2013   Procedure: LYMPH NODE DISSECTION;  Surgeon: [name], MD;  Location: [place];  Service: Thoracic;  Laterality: Right;  . [place]   "large incision from chest to up to shoulder, the nerves were tied together, for raynaud's  . THORACOTOMY  06/07/2013   Procedure: MINI/LIMITED THORACOTOMY; right middle lobe wedge resection;  Surgeon: [name], MD;  Location: [place];  Service: Thoracic;;  . TONSILLECTOMY  child  . TOTAL ABDOMINAL HYSTERECTOMY  1980's    W/ BSO  . TOTAL HIP ARTHROPLASTY Right 04/28/2014   Procedure: RIGHT TOTAL HIP ARTHROPLASTY ANTERIOR APPROACH;  Surgeon: [name], MD;  Location: [facility];  Service: Orthopedics;  Laterality: Right;  . TRANSTHORACIC ECHOCARDIOGRAM  12/11/2008   ef 65-78%, grade 1 diastolic dysfunction/  mild LAE/  mild AR and MR/  trivial TR  . VIDEO ASSISTED THORACOSCOPY (VATS)/WEDGE RESECTION Right 06/07/2013   Procedure: VIDEO ASSISTED THORACOSCOPY (  VATS)/right upper lobectomy, On Q;  Surgeon: [name], MD;  Location: [place];  Service: Thoracic;  Laterality: Right;  Marland Kitchen VIDEO BRONCHOSCOPY N/A 06/07/2013   Procedure: VIDEO BRONCHOSCOPY;  Surgeon: [name], MD;  Location[facility];  Service: Thoracic;  Laterality: N/A;  . VIDEO BRONCHOSCOPY WITH ENDOBRONCHIAL NAVIGATION N/A 05/04/2013   Procedure: VIDEO BRONCHOSCOPY WITH ENDOBRONCHIAL NAVIGATION;  Surgeon: [name], MD;  Location: [place];  Service: Thoracic;  Laterality: N/A;    Social History   Socioeconomic History  . Marital status: Widowed    Spouse name: Not on file  . Number of children: 2  . Years of education: Not on file  . Highest education level: Not on file  Occupational History  . Occupation: n/a  Social Needs  . Financial resource strain: Not on file  . Food insecurity:    Worry: Not on file    Inability: Not on file  . Transportation needs:    Medical: Not on file    Non-medical: Not on file  Tobacco Use  . Smoking status: Never Smoker  . Smokeless tobacco: Never Used  Substance and Sexual Activity  . Alcohol use: Not Currently    Frequency: Never  .  Drug use: Never  . Sexual activity: Not Currently    Birth control/protection: Surgical  Lifestyle  . Physical activity:    Days per week: Not on file    Minutes per session: Not on file  . Stress: Not on file  Relationships  . Social connections:    Talks on phone: Not on file    Gets together: Not on file    Attends religious service: Not on file    Active member of club or organization: Not on file    Attends meetings of clubs or organizations: Not on file    Relationship status: Not on file  . Intimate partner violence:    Fear of current or ex partner: Not on file    Emotionally abused: Not on file    Physically abused: Not on file    Forced sexual activity: Not on file  Other Topics Concern  . Not on file  Social History Narrative   Lives with husband, daughter and grandchild local.   Highest level of education:  masters in education admin and technology    Family History  Problem Relation Age of Onset  . Coronary artery disease Father   . Colon cancer Father   . Diabetes Father   . Cancer Father        colon  . Other Mother 58       MVA  . Healthy Sister   . Other Brother        Killed on war  . Healthy Daughter   . Esophageal cancer Neg Hx   . Kidney disease Neg Hx   . Liver disease Neg Hx     ROS: Some weakness and edema right lower extremity but no fevers or chills, productive cough, hemoptysis, dysphasia, odynophagia, melena, hematochezia, dysuria, hematuria, rash, seizure activity, orthopnea, PND, claudication. Remaining systems are negative.  Physical Exam:   Blood pressure 128/68, pulse 83, height '5\' 2"'$  (1.575 m), weight 108 lb 1.9 oz (49 kg), SpO2 94 %.  General:  Well developed/frailin NAD Skin warm/dry Patient not depressed No peripheral clubbing Back-normal HEENT-normal/normal eyelids Neck supple/normal carotid upstroke bilaterally; no bruits; no JVD; no thyromegaly chest - CTA/ normal expansion CV - RRR/normal S1 and S2; no murmurs, rubs  or gallops;  PMI nondisplaced Abdomen -NT/ND, no HSM, no mass, + bowel sounds, no bruit 2+ femoral pulses, no bruits Ext-1+ edema RLE with mild erythema Neuro-grossly nonfocal  Electrocardiogram January 27, 2018 personally reviewed-sinus rhythm with left bundle branch block.  A/P  1 cardiomyopathy-etiology unclear but has been chronic.  Possibly secondary to left bundle branch block with conduction delay.  Patient is elderly and would like to be conservative if possible.  Previous nuclear study showed no ischemia.  Plan medical therapy.  Add carvedilol 3.125 mg twice daily and increase as needed.  Can add low-dose ARB in the future as blood pressure and renal function allow.  Note previous TSH normal.  2 left bundle branch block-noted on previous ECG.  3 mild to moderate mitral regurgitation  4 edema right lower extremity-secondary to recent hip replacement.  Kirk Ruths, MD

## 2018-03-23 ENCOUNTER — Encounter (INDEPENDENT_AMBULATORY_CARE_PROVIDER_SITE_OTHER): Payer: Self-pay | Admitting: Orthopaedic Surgery

## 2018-03-23 ENCOUNTER — Ambulatory Visit: Payer: Medicare Other | Admitting: Gastroenterology

## 2018-03-23 ENCOUNTER — Ambulatory Visit (INDEPENDENT_AMBULATORY_CARE_PROVIDER_SITE_OTHER): Payer: Medicare Other | Admitting: Orthopaedic Surgery

## 2018-03-23 DIAGNOSIS — Z96641 Presence of right artificial hip joint: Secondary | ICD-10-CM

## 2018-03-23 DIAGNOSIS — Z96649 Presence of unspecified artificial hip joint: Secondary | ICD-10-CM

## 2018-03-23 NOTE — Progress Notes (Signed)
HPI: [name] returns almost 2 months status post revision of right total hip with exchange of the liner and ball.  She is had no recurrent dislocations.  She is ambulating with a cane.  States she is overall doing well in regards to the right hip does have some swelling still in the right leg and notes some redness in her lower leg she is finished her antibiotics for cellulitis of the right lower leg.  She is had no fevers or chills.  She has some "weak spells" in the morning and that is her blood pressure is low in the morning she recently had some blood work this shows that she is still anemic and this is being worked up also. States that her left shoulder injection by Dr. Ninfa Linden at last visit did help with the shoulder pain.  However pain is starting to slowly return.  Physical exam: General frail appearing female in no acute distress mood affect appropriate.  Ambulates with a cane without any antalgic gait.  Up and down out of the chair on her own.  Right hip: Good range of motion without pain.  Surgical incisions healing well no signs of infection.  Right calf supple nontender.  Slight cellulitis changes right lower leg.  Impression: Status post right hip revision 01/24/2018  Plan: We will have her follow-up with [place] in 3 months.  Sooner if she has any questions concerns.  Recommend she wash her right lower leg with a antibacterial soap daily.  Questions encouraged and answered at length by Dr. Ninfa Linden and myself.

## 2018-03-25 ENCOUNTER — Ambulatory Visit: Payer: Medicare Other | Admitting: Gastroenterology

## 2018-03-26 ENCOUNTER — Ambulatory Visit: Payer: Medicare Other | Admitting: Gastroenterology

## 2018-03-31 ENCOUNTER — Ambulatory Visit (INDEPENDENT_AMBULATORY_CARE_PROVIDER_SITE_OTHER): Payer: Medicare Other | Admitting: Cardiology

## 2018-03-31 ENCOUNTER — Encounter: Payer: Self-pay | Admitting: Cardiology

## 2018-03-31 ENCOUNTER — Other Ambulatory Visit (INDEPENDENT_AMBULATORY_CARE_PROVIDER_SITE_OTHER): Payer: Self-pay | Admitting: Orthopaedic Surgery

## 2018-03-31 VITALS — BP 128/68 | HR 83 | Ht 62.0 in | Wt 108.1 lb

## 2018-03-31 DIAGNOSIS — I340 Nonrheumatic mitral (valve) insufficiency: Secondary | ICD-10-CM | POA: Diagnosis not present

## 2018-03-31 DIAGNOSIS — I420 Dilated cardiomyopathy: Secondary | ICD-10-CM | POA: Diagnosis not present

## 2018-03-31 MED ORDER — CARVEDILOL 3.125 MG PO TABS: 3.1250 mg | ORAL_TABLET | Freq: Two times a day (BID) | ORAL | 3 refills | Status: DC

## 2018-03-31 NOTE — Patient Instructions (Signed)
Medication Instructions:   START CARVEDILOL 3.125 MG ONE TABLET TWICE DAILY  Follow-Up:  Your physician recommends that you schedule a follow-up appointment in: [place]

## 2018-03-31 NOTE — Telephone Encounter (Signed)
Please advise 

## 2018-04-03 ENCOUNTER — Encounter: Payer: Self-pay | Admitting: Family Medicine

## 2018-04-06 NOTE — Progress Notes (Signed)
[facility] [address], [place], [place] [address] [phone]  Date:  04/07/2018   Name:  [name]   DOB:  May 10, 1930   MRN:  [id number]  PCP:  Darreld Mclean, MD    Chief Complaint: Edema (right leg swelling, fuid, foot turning blue-not getting good circulation, leak weakness) and Lab Work (lab closed-needing to go upstairs to [employer])   History of Present Illness:  ICEL CASTLES is a 85 y.o. very pleasant female patient who presents with the following: History of lung cancer diagnosed in 2015-post right upper lobectomy She has done well from her cancer, but recently developed acute blood loss anemia from hip replacement surgery We have been monitoring her hemoglobin, and it has been trending up.  Hemoglobin was 8 January 6, up to 10 January 24 She did have a positive Hemoccult, I have advised her to see gastroenterology.  However she thinks this is likely from a hemorrhoid, and is currently deferring a GI visit  Patient had sent me a message with concern about her hip/leg, so I asked her to come in and see me. She had a right total replacement 38/10, complicated by 2 falls resulting in hip dislocation which necessitated return to the operating room for hip redo on January 24, 2018.  She has had some swelling of the leg, and also developed cellulitis in the aftermath of her hip revision  She just recently saw her cardiologist, she has history of heart failure but we do not strongly suspect her current swelling is due to CHF, as it is only the right leg She has not had a Doppler of the leg so far Dr. Stanford Breed added carvedilol- changed to toprol   She also saw her surgeon Dr. Ninfa Linden on February 4.  Patient reports that they had planned to release her at this visit, but aside have her follow-up in 3 months due to persistent swelling  It has now been nearly 3 months since her operation, and she notes that  her right leg is still larger than her left both in the thigh and the calf Wt Readings from Last 3 Encounters:  04/07/18 113 lb (51.3 kg)  03/31/18 108 lb 1.9 oz (49 kg)  02/24/18 115 lb (52.2 kg)   Lasix 20 once daily Spiro 12.5 No fever noted, no flulike symptoms   Patient Active Problem List   Diagnosis Date Noted  . Anemia of chronic disease 02/24/2018  . Unstable right hip arthroplasty 01/24/2018  . History of revision of total replacement of right hip joint 01/24/2018  . Hypovolemic shock ([place])   . Hyperkalemia   . Hyponatremia   . Failed total hip arthroplasty ([place]) 11/27/2017  . Status post revision of total hip 11/27/2017  . Elevated cholesterol 10/11/2015  . 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[place]) 10/04/2014  . Bilateral leg edema 08/01/2014  . Elevated BP 08/01/2014  . Dehydration 08/01/2014  . Rheumatoid arthritis involving multiple joints ([place]) 05/30/2014  . Osteoarthritis of right hip 04/28/2014  . Status post total replacement of right hip 04/28/2014  . Long-term use of high-risk medication 11/11/2013  . Routine general medical examination at a health care facility 11/11/2013  . Anemia, unspecified 11/11/2013  . Neuropathic pain 07/07/2013  . Constipation due to pain medication 07/07/2013  . Protein-calorie malnutrition, severe ([place]) 06/08/2013  . Lung cancer, Right upper lobe 05/08/2013  . Anterior chest wall pain 09/18/2011  . Breast lump  09/18/2011  . Sciatica of right side 08/13/2011  . Sinusitis 06/13/2011  . Cerumen impaction 08/20/2010  . SINUSITIS - ACUTE-NOS 04/30/2010  . Osteopenia 03/07/2010  . PARESTHESIA 03/07/2010  . CONTUSION OF LOWER LEG 11/29/2009  . DIZZINESS 10/01/2009  . Backache 03/16/2009  . CT, CHEST, ABNORMAL 12/18/2008  . ABNORMAL ECHOCARDIOGRAM 12/14/2008  . URI 11/29/2008  . [place] SYNDROME 11/29/2008  . CONTACT DERMATITIS 11/02/2006  . HYPOGLYCEMIA 06/29/2006  . RAYNAUD'S DISEASE 06/29/2006    Past Medical History:   Diagnosis Date  . Arthralgia of multiple joints    followed by dr Gerilyn Nestle  . Arthritis   . Cardiomyopathy ([place])   . Chronic constipation   . Chronic inflammatory arthritis    rhemotolgist-  dr a. Gerilyn Nestle [facility])  . Dry eyes    eye drops used   . GERD (gastroesophageal reflux disease)   . H/O discoid lupus erythematosus   . Hiatal hernia   . History of colon polyps   . Hypothyroidism   . Iron deficiency anemia   . LBBB (left bundle branch block) 2010  . Mitchell's disease (erythromelalgia) [facility])    neurologist-  dr patel  . Nocturia   . Non-small cell cancer of right lung [facility]) surgeon-- dr gerhardt/  oncologist-  dr Julien Nordmann--- per lov notes no recurrence/   11-18-2017 per pt denies any symptoms   dx 2015--  Stage IIA (T2b,N0,M0) , +EGFR  mutation in exon 21, non-small cell adenocarcinoma right upper lobe---  s/p  Right upper lobectomy , right middley wedge resection and node dissection---  no chemo or radiation therapy  . OA (osteoarthritis)    hands  . Osteoporosis   . PONV (postoperative nausea and vomiting)    likes phenergan  . Raynaud's phenomenon 1965  . Renal insufficiency   . Rheumatoid arthritis ([place])   . Sciatica   . Scoliosis   . Sjogren's syndrome [facility])     Past Surgical History:  Procedure Laterality Date  . ANTERIOR HIP REVISION Right 11/27/2017   Procedure: RIGHT HIP ACETABULAR REVISION;  Surgeon: [name], MD;  Location: [facility];  Service: Orthopedics;  Laterality: Right;  . ANTERIOR HIP REVISION Right 01/24/2018   Procedure: OPEN REDUCTION OF DISLOCATED ANTERIOR HIP WITH REVISION OF LINER AND HIP BALL;  Surgeon: [name], MD;  Location: [facility];  Service: Orthopedics;  Laterality: Right;  . APPENDECTOMY  1950s  . CARDIOVASCULAR STRESS TEST  12/2008    mild fixed basal to mid septal perfusion defect felt likely due to artifact from LBBB, no ischemia, EF 58%  . COLONOSCOPY    . LYMPH NODE DISSECTION Right  06/07/2013   Procedure: LYMPH NODE DISSECTION;  Surgeon: [name], MD;  Location: [place];  Service: Thoracic;  Laterality: Right;  . [place]   "large incision from chest to up to shoulder, the nerves were tied together, for raynaud's  . THORACOTOMY  06/07/2013   Procedure: MINI/LIMITED THORACOTOMY; right middle lobe wedge resection;  Surgeon: [name], MD;  Location: [place];  Service: Thoracic;;  . TONSILLECTOMY  child  . TOTAL ABDOMINAL HYSTERECTOMY  1980's    W/ BSO  . TOTAL HIP ARTHROPLASTY Right 04/28/2014   Procedure: RIGHT TOTAL HIP ARTHROPLASTY ANTERIOR APPROACH;  Surgeon: [name], MD;  Location: [facility];  Service: Orthopedics;  Laterality: Right;  . TRANSTHORACIC ECHOCARDIOGRAM  12/11/2008   ef 25-36%, grade 1 diastolic dysfunction/  mild LAE/  mild AR and MR/  trivial TR  . VIDEO ASSISTED THORACOSCOPY (VATS)/WEDGE RESECTION Right 06/07/2013   Procedure: VIDEO ASSISTED THORACOSCOPY (VATS)/right upper lobectomy, On Q;  Surgeon: [name], MD;  Location: [place];  Service: Thoracic;  Laterality: Right;  Marland Kitchen VIDEO BRONCHOSCOPY N/A 06/07/2013   Procedure: VIDEO BRONCHOSCOPY;  Surgeon: [name], MD;  Location: [facility] OR;  Service: Thoracic;  Laterality: N/A;  . VIDEO BRONCHOSCOPY WITH ENDOBRONCHIAL NAVIGATION N/A 05/04/2013   Procedure: VIDEO BRONCHOSCOPY WITH ENDOBRONCHIAL NAVIGATION;  Surgeon: [name], MD;  Location: [place];  Service: Thoracic;  Laterality: N/A;    Social History   Tobacco Use  . Smoking status: Never Smoker  . Smokeless tobacco: Never Used  Substance Use Topics  . Alcohol use: Not Currently    Frequency: Never  . Drug use: Never    Family History  Problem Relation Age of Onset  . Coronary artery disease Father   . Colon cancer Father   . Diabetes Father   . Cancer Father        colon  . Other Mother 71       MVA  . Healthy Sister   . Other Brother        Killed on war  . Healthy  Daughter   . Esophageal cancer Neg Hx   . Kidney disease Neg Hx   . Liver disease Neg Hx     Allergies  Allergen Reactions  . Amlodipine Rash  . Prochlorperazine Edisylate Anaphylaxis    Compazine--- tongue swells and rash  . Aspirin Other (See Comments)    nose bleeds  . Cymbalta [Duloxetine Hcl] Diarrhea, Nausea And Vomiting and Other (See Comments)    Increased blood pressure  . Pamelor [Nortriptyline Hcl] Diarrhea and Nausea Only    Increased Heart rate and BP    Medication list has been reviewed and updated.  Current Outpatient Medications on File Prior to Visit  Medication Sig Dispense Refill  . Biotin 1000 MCG tablet Take 1,000 mcg by mouth daily.     . Calcium Carbonate-Vitamin D (CALCIUM 500 + D) 500-125 MG-UNIT TABS Take 1 tablet by mouth daily.     Marland Kitchen docusate sodium (STOOL SOFTENER) 100 MG capsule Take 100 mg by mouth 2 (two) times daily.      Marland Kitchen doxycycline (VIBRA-TABS) 100 MG tablet Take 1 tablet (100 mg total) by mouth 2 (two) times daily. 30 tablet 0  . furosemide (LASIX) 20 MG tablet TAKE 1 TABLET BY MOUTH EVERY DAY 30 tablet 0  . gabapentin (NEURONTIN) 300 MG capsule TAKE '300MG'$  IN THE MORNING, '300MG'$  IN THE AFTERNOON, AND '600MG'$  AT BEDTIME. (Patient taking differently: Take 300-600 mg by mouth See admin instructions. Take '300mg'$  in the morning, '300mg'$  in the afternoon, and '600mg'$  at bedtime.) 120 capsule 5  . Glucosamine-Chondroit-Vit C-Mn (GLUCOSAMINE CHONDR 1500 COMPLX PO) Take 1 capsule by mouth daily.     Marland Kitchen HYDROcodone-acetaminophen (NORCO/VICODIN) 5-325 MG tablet Take 1 tablet by mouth every 4 (four) hours as needed for severe pain. 40 tablet 0  . levothyroxine (SYNTHROID, LEVOTHROID) 88 MCG tablet TAKE 1 TABLET (88 MCG TOTAL) BY MOUTH DAILY BEFORE BREAKFAST. (Patient taking differently: Take 88 mcg by mouth at bedtime. Per pt takes at 10pm) 90 tablet 1  . meloxicam (MOBIC) 7.5 MG tablet Take 7.5 mg by mouth 2 (two) times daily as needed for pain.    . methocarbamol  (ROBAXIN) 500 MG tablet TAKE 1 TABLET (500 MG TOTAL) BY MOUTH EVERY 6 (SIX) HOURS AS NEEDED  FOR MUSCLE SPASMS. 40 tablet 0  . metoprolol succinate (TOPROL XL) 25 MG 24 hr tablet Take 0.5 tablets (12.5 mg total) by mouth daily. 45 tablet 3  . Multiple Vitamin (MULTIVITAMIN) tablet Take 1 tablet by mouth daily.      Marland Kitchen omeprazole (PRILOSEC) 20 MG capsule TAKE 1 CAPSULE BY MOUTH 2 TIMES DAILY BEFORE A MEAL. (Patient taking differently: Take 20 mg by mouth 2 (two) times daily before a meal. TAKE 1 CAPSULE BY MOUTH 2 TIMES DAILY BEFORE A MEAL.) 180 capsule 1  . spironolactone (ALDACTONE) 25 MG tablet Take 12.5 mg by mouth every morning.   2   No current facility-administered medications on file prior to visit.     Review of Systems:  As per HPI- otherwise negative.   Physical Examination: Vitals:   04/07/18 1613  BP: 140/80  Pulse: 77  Resp: 16  SpO2: 98%   Vitals:   04/07/18 1613  Weight: 113 lb (51.3 kg)  Height: '5\' 2"'$  (1.575 m)   Body mass index is 20.67 kg/m. Ideal Body Weight: Weight in (lb) to have BMI = 25: 136.4  GEN: WDWN, NAD, Non-toxic, A & O x 3, looks well, petite build HEENT: Atraumatic, Normocephalic. Neck supple. No masses, No LAD. Ears and Nose: No external deformity. CV: RRR, No M/G/R. No JVD. No thrill. No extra heart sounds. PULM: CTA B, no wheezes, crackles, rhonchi. No retractions. No resp. distress. No accessory muscle use. EXTR: No c/c/e NEURO Normal gait for age, uses cane PSYCH: Normally interactive. Conversant. Not depressed or anxious appearing.  Calm demeanor.  Right lower extremity: She has edema from of the thigh to the midfoot.  The lower leg shows skin changes of stasis dermatitis.  There is no seepage from the skin, but it looks as though this might occur. Minimal erythema of the lower leg, secondary to edema.  I have not currently suspect cellulitis I am not able to palpate a pulse in the foot, but the foot is warm and well perfused with normal  cap refill No skin ulceration or breakdown Her actual surgical site looks fine, it is well-healed, no redness  We discussed an Unna boot, and she would like to try this.  I wrapped the lower leg in an Unna boot, without applying any pressure.  I did apply a loose Ace bandage over the top, to keep the Unna boot from staining her clothing.  BP Readings from Last 3 Encounters:  04/07/18 140/80  03/31/18 128/68  02/24/18 (!) 153/74    Assessment and Plan: Acute blood loss anemia - Plan: CBC  Right leg swelling - Plan: US Venous Img Lower Unilateral Right, B Nat Peptide  Medication monitoring encounter - Plan: Basic metabolic panel  Here today with persistent right leg swelling, following a hip replacement and then a redo hip replacement.  I suspect that she has lymphedema.  She is already on diuretics, but swelling persists.  I applied an Unna boot to see if it will tighten her leg any. I am not overly suspicious of a DVT, but will have her do an ultrasound in 2 days.  We are not able to do an ultrasound at this hour, and snow is likely for tomorrow.  Also on Friday (today is Wednesday) would like to repeat a CBC, and will do a BNP and BMP at the same time  She states understanding and agreement with the plan ----------------------------------------------------- It was good to see you today I am hopeful that the  leg wrap we used may help reduce some of your swelling- you can leave it in place for 2-3 days, then remove at home Please let me know if it seems to help you If however you have pain or discomfort take the wrap off  We will set up an ultrasound of your leg for Friday- you can have your CBC drawn the same day  If the leg wrap does help I can do anther one next week   Signed Lamar Blinks, MD

## 2018-04-07 ENCOUNTER — Encounter: Payer: Self-pay | Admitting: Family Medicine

## 2018-04-07 ENCOUNTER — Ambulatory Visit: Payer: Medicare Other | Admitting: Family Medicine

## 2018-04-07 VITALS — BP 140/80 | HR 77 | Resp 16 | Ht 62.0 in | Wt 113.0 lb

## 2018-04-07 DIAGNOSIS — M7989 Other specified soft tissue disorders: Secondary | ICD-10-CM

## 2018-04-07 DIAGNOSIS — D62 Acute posthemorrhagic anemia: Secondary | ICD-10-CM | POA: Diagnosis not present

## 2018-04-07 DIAGNOSIS — Z5181 Encounter for therapeutic drug level monitoring: Secondary | ICD-10-CM

## 2018-04-07 MED ORDER — METOPROLOL SUCCINATE ER 25 MG PO TB24: 12.5000 mg | ORAL_TABLET | Freq: Every day | ORAL | 3 refills | Status: DC

## 2018-04-07 NOTE — Patient Instructions (Signed)
It was good to see you today I am hopeful that the leg wrap we used may help reduce some of your swelling- you can leave it in place for 2-3 days, then remove at home Please let me know if it seems to help you If however you have pain or discomfort take the wrap off  We will set up an ultrasound of your leg for Friday- you can have your CBC drawn the same day  If the leg wrap does help I can do anther one next week

## 2018-04-07 NOTE — Telephone Encounter (Signed)
Dc coreg and treat with toprol 12.5 mg daily  [place] with pt, she has an appointment with her medical doctor today because her legs are still turning blue even though she is off the carvedilol. She feels her blood count maybe low again. Aware of medication change and New script sent to the pharmacy.

## 2018-04-09 ENCOUNTER — Encounter (HOSPITAL_COMMUNITY): Payer: Self-pay | Admitting: Emergency Medicine

## 2018-04-09 ENCOUNTER — Inpatient Hospital Stay (HOSPITAL_COMMUNITY)
Admission: EM | Admit: 2018-04-09 | Discharge: 2018-04-17 | DRG: 253 | Disposition: A | Discharge status: Home Health | Payer: Medicare Other | Attending: Internal Medicine | Admitting: Internal Medicine

## 2018-04-09 ENCOUNTER — Telehealth: Payer: Self-pay

## 2018-04-09 ENCOUNTER — Other Ambulatory Visit: Payer: Self-pay

## 2018-04-09 ENCOUNTER — Ambulatory Visit (HOSPITAL_BASED_OUTPATIENT_CLINIC_OR_DEPARTMENT_OTHER)
Admission: RE | Admit: 2018-04-09 | Discharge: 2018-04-09 | Disposition: A | Discharge status: Home / Self Care | Payer: Medicare Other | Source: Ambulatory Visit | Attending: Family Medicine | Admitting: Family Medicine

## 2018-04-09 ENCOUNTER — Emergency Department (HOSPITAL_COMMUNITY): Payer: Medicare Other

## 2018-04-09 ENCOUNTER — Other Ambulatory Visit (INDEPENDENT_AMBULATORY_CARE_PROVIDER_SITE_OTHER): Payer: Medicare Other

## 2018-04-09 DIAGNOSIS — K5900 Constipation, unspecified: Secondary | ICD-10-CM | POA: Diagnosis not present

## 2018-04-09 DIAGNOSIS — Z8249 Family history of ischemic heart disease and other diseases of the circulatory system: Secondary | ICD-10-CM

## 2018-04-09 DIAGNOSIS — E871 Hypo-osmolality and hyponatremia: Secondary | ICD-10-CM | POA: Diagnosis present

## 2018-04-09 DIAGNOSIS — M19041 Primary osteoarthritis, right hand: Secondary | ICD-10-CM | POA: Diagnosis present

## 2018-04-09 DIAGNOSIS — I724 Aneurysm of artery of lower extremity: Principal | ICD-10-CM | POA: Diagnosis present

## 2018-04-09 DIAGNOSIS — Z85118 Personal history of other malignant neoplasm of bronchus and lung: Secondary | ICD-10-CM

## 2018-04-09 DIAGNOSIS — I429 Cardiomyopathy, unspecified: Secondary | ICD-10-CM | POA: Diagnosis present

## 2018-04-09 DIAGNOSIS — I743 Embolism and thrombosis of arteries of the lower extremities: Secondary | ICD-10-CM | POA: Diagnosis present

## 2018-04-09 DIAGNOSIS — I749 Embolism and thrombosis of unspecified artery: Secondary | ICD-10-CM

## 2018-04-09 DIAGNOSIS — I129 Hypertensive chronic kidney disease with stage 1 through stage 4 chronic kidney disease, or unspecified chronic kidney disease: Secondary | ICD-10-CM | POA: Diagnosis present

## 2018-04-09 DIAGNOSIS — K3189 Other diseases of stomach and duodenum: Secondary | ICD-10-CM | POA: Diagnosis not present

## 2018-04-09 DIAGNOSIS — Z96641 Presence of right artificial hip joint: Secondary | ICD-10-CM | POA: Diagnosis present

## 2018-04-09 DIAGNOSIS — I340 Nonrheumatic mitral (valve) insufficiency: Secondary | ICD-10-CM | POA: Diagnosis not present

## 2018-04-09 DIAGNOSIS — M7989 Other specified soft tissue disorders: Secondary | ICD-10-CM | POA: Diagnosis not present

## 2018-04-09 DIAGNOSIS — R739 Hyperglycemia, unspecified: Secondary | ICD-10-CM | POA: Diagnosis not present

## 2018-04-09 DIAGNOSIS — K449 Diaphragmatic hernia without obstruction or gangrene: Secondary | ICD-10-CM | POA: Diagnosis not present

## 2018-04-09 DIAGNOSIS — M069 Rheumatoid arthritis, unspecified: Secondary | ICD-10-CM | POA: Diagnosis present

## 2018-04-09 DIAGNOSIS — Z8719 Personal history of other diseases of the digestive system: Secondary | ICD-10-CM

## 2018-04-09 DIAGNOSIS — E039 Hypothyroidism, unspecified: Secondary | ICD-10-CM | POA: Diagnosis present

## 2018-04-09 DIAGNOSIS — I70201 Unspecified atherosclerosis of native arteries of extremities, right leg: Secondary | ICD-10-CM

## 2018-04-09 DIAGNOSIS — D62 Acute posthemorrhagic anemia: Secondary | ICD-10-CM | POA: Diagnosis not present

## 2018-04-09 DIAGNOSIS — I472 Ventricular tachycardia: Secondary | ICD-10-CM | POA: Diagnosis not present

## 2018-04-09 DIAGNOSIS — Z886 Allergy status to analgesic agent status: Secondary | ICD-10-CM

## 2018-04-09 DIAGNOSIS — Z5181 Encounter for therapeutic drug level monitoring: Secondary | ICD-10-CM | POA: Diagnosis not present

## 2018-04-09 DIAGNOSIS — N179 Acute kidney failure, unspecified: Secondary | ICD-10-CM | POA: Diagnosis not present

## 2018-04-09 DIAGNOSIS — R609 Edema, unspecified: Secondary | ICD-10-CM

## 2018-04-09 DIAGNOSIS — Z79890 Hormone replacement therapy (postmenopausal): Secondary | ICD-10-CM

## 2018-04-09 DIAGNOSIS — L930 Discoid lupus erythematosus: Secondary | ICD-10-CM | POA: Diagnosis present

## 2018-04-09 DIAGNOSIS — M79604 Pain in right leg: Secondary | ICD-10-CM

## 2018-04-09 DIAGNOSIS — D649 Anemia, unspecified: Secondary | ICD-10-CM

## 2018-04-09 DIAGNOSIS — K219 Gastro-esophageal reflux disease without esophagitis: Secondary | ICD-10-CM | POA: Diagnosis present

## 2018-04-09 DIAGNOSIS — I10 Essential (primary) hypertension: Secondary | ICD-10-CM | POA: Diagnosis not present

## 2018-04-09 DIAGNOSIS — M19042 Primary osteoarthritis, left hand: Secondary | ICD-10-CM | POA: Diagnosis present

## 2018-04-09 DIAGNOSIS — K5909 Other constipation: Secondary | ICD-10-CM | POA: Diagnosis present

## 2018-04-09 DIAGNOSIS — Q438 Other specified congenital malformations of intestine: Secondary | ICD-10-CM | POA: Diagnosis not present

## 2018-04-09 DIAGNOSIS — I7300 Raynaud's syndrome without gangrene: Secondary | ICD-10-CM | POA: Diagnosis present

## 2018-04-09 DIAGNOSIS — M3500 Sicca syndrome, unspecified: Secondary | ICD-10-CM | POA: Diagnosis present

## 2018-04-09 DIAGNOSIS — R195 Other fecal abnormalities: Secondary | ICD-10-CM | POA: Diagnosis not present

## 2018-04-09 DIAGNOSIS — I447 Left bundle-branch block, unspecified: Secondary | ICD-10-CM | POA: Diagnosis present

## 2018-04-09 DIAGNOSIS — M064 Inflammatory polyarthropathy: Secondary | ICD-10-CM | POA: Diagnosis present

## 2018-04-09 DIAGNOSIS — L03115 Cellulitis of right lower limb: Secondary | ICD-10-CM | POA: Diagnosis present

## 2018-04-09 DIAGNOSIS — Z79899 Other long term (current) drug therapy: Secondary | ICD-10-CM

## 2018-04-09 DIAGNOSIS — E7800 Pure hypercholesterolemia, unspecified: Secondary | ICD-10-CM | POA: Diagnosis present

## 2018-04-09 DIAGNOSIS — N183 Chronic kidney disease, stage 3 (moderate): Secondary | ICD-10-CM | POA: Diagnosis present

## 2018-04-09 DIAGNOSIS — Q399 Congenital malformation of esophagus, unspecified: Secondary | ICD-10-CM

## 2018-04-09 DIAGNOSIS — K641 Second degree hemorrhoids: Secondary | ICD-10-CM | POA: Diagnosis present

## 2018-04-09 DIAGNOSIS — I742 Embolism and thrombosis of arteries of the upper extremities: Secondary | ICD-10-CM | POA: Diagnosis not present

## 2018-04-09 DIAGNOSIS — M810 Age-related osteoporosis without current pathological fracture: Secondary | ICD-10-CM | POA: Diagnosis present

## 2018-04-09 DIAGNOSIS — I729 Aneurysm of unspecified site: Secondary | ICD-10-CM

## 2018-04-09 DIAGNOSIS — I361 Nonrheumatic tricuspid (valve) insufficiency: Secondary | ICD-10-CM | POA: Diagnosis not present

## 2018-04-09 DIAGNOSIS — E875 Hyperkalemia: Secondary | ICD-10-CM | POA: Diagnosis not present

## 2018-04-09 DIAGNOSIS — Z902 Acquired absence of lung [part of]: Secondary | ICD-10-CM

## 2018-04-09 DIAGNOSIS — Z888 Allergy status to other drugs, medicaments and biological substances status: Secondary | ICD-10-CM

## 2018-04-09 DIAGNOSIS — T402X5A Adverse effect of other opioids, initial encounter: Secondary | ICD-10-CM | POA: Diagnosis not present

## 2018-04-09 DIAGNOSIS — D123 Benign neoplasm of transverse colon: Secondary | ICD-10-CM | POA: Diagnosis not present

## 2018-04-09 DIAGNOSIS — M419 Scoliosis, unspecified: Secondary | ICD-10-CM | POA: Diagnosis present

## 2018-04-09 LAB — CBC WITH DIFFERENTIAL/PLATELET
Abs Immature Granulocytes: 0.02 K/uL (ref 0.00–0.07)
Basophils Absolute: 0 K/uL (ref 0.0–0.1)
Basophils Relative: 1 %
Eosinophils Absolute: 0.1 K/uL (ref 0.0–0.5)
Eosinophils Relative: 2 %
HCT: 26.8 % — ABNORMAL LOW (ref 36.0–46.0)
Hemoglobin: 8 g/dL — ABNORMAL LOW (ref 12.0–15.0)
Immature Granulocytes: 0 %
Lymphocytes Relative: 17 %
Lymphs Abs: 0.8 K/uL (ref 0.7–4.0)
MCH: 28.5 pg (ref 26.0–34.0)
MCHC: 29.9 g/dL — ABNORMAL LOW (ref 30.0–36.0)
MCV: 95.4 fL (ref 80.0–100.0)
Monocytes Absolute: 0.6 K/uL (ref 0.1–1.0)
Monocytes Relative: 13 %
Neutro Abs: 3.2 K/uL (ref 1.7–7.7)
Neutrophils Relative %: 67 %
Platelets: 152 K/uL (ref 150–400)
RBC: 2.81 MIL/uL — ABNORMAL LOW (ref 3.87–5.11)
RDW: 15.8 % — ABNORMAL HIGH (ref 11.5–15.5)
WBC: 4.7 K/uL (ref 4.0–10.5)
nRBC: 0 % (ref 0.0–0.2)

## 2018-04-09 LAB — BASIC METABOLIC PANEL
Anion gap: 8 (ref 5–15)
BUN: 26 mg/dL — ABNORMAL HIGH (ref 6–23)
BUN: 26 mg/dL — ABNORMAL HIGH (ref 8–23)
CO2: 29 mEq/L (ref 19–32)
CO2: 25 mmol/L (ref 22–32)
CREATININE: 1.1 mg/dL — AB (ref 0.44–1.00)
Calcium: 8.9 mg/dL (ref 8.4–10.5)
Calcium: 8.7 mg/dL — ABNORMAL LOW (ref 8.9–10.3)
Chloride: 97 mEq/L (ref 96–112)
Chloride: 99 mmol/L (ref 98–111)
Creatinine, Ser: 1.19 mg/dL (ref 0.40–1.20)
GFR calc Af Amer: 53 mL/min — ABNORMAL LOW (ref >60)
GFR calc non Af Amer: 46 mL/min — ABNORMAL LOW (ref >60)
GFR: 43.07 mL/min — ABNORMAL LOW (ref >60.00)
Glucose, Bld: 82 mg/dL (ref 70–99)
Glucose, Bld: 90 mg/dL (ref 70–99)
Potassium: 4.7 mEq/L (ref 3.5–5.1)
Potassium: 4.7 mmol/L (ref 3.5–5.1)
SODIUM: 132 meq/L — AB (ref 135–145)
Sodium: 132 mmol/L — ABNORMAL LOW (ref 135–145)

## 2018-04-09 LAB — CBC
HCT: 26.4 % — ABNORMAL LOW (ref 36.0–46.0)
Hemoglobin: 8.8 g/dL — ABNORMAL LOW (ref 12.0–15.0)
MCHC: 33.3 g/dL (ref 30.0–36.0)
MCV: 89.8 fl (ref 78.0–100.0)
Platelets: 153 10*3/uL (ref 150.0–400.0)
RBC: 2.94 Mil/uL — AB (ref 3.87–5.11)
RDW: 15.9 % — ABNORMAL HIGH (ref 11.5–15.5)
WBC: 5 10*3/uL (ref 4.0–10.5)

## 2018-04-09 LAB — PREPARE RBC (CROSSMATCH)

## 2018-04-09 LAB — BRAIN NATRIURETIC PEPTIDE: Pro B Natriuretic peptide (BNP): 333 pg/mL — ABNORMAL HIGH (ref 0.0–100.0)

## 2018-04-09 MED ORDER — CEFAZOLIN SODIUM-DEXTROSE 2-4 GM/100ML-% IV SOLN
2.0000 g | INTRAVENOUS | Status: AC
  Administered 2018-04-10: 2 g via INTRAVENOUS
  Filled 2018-04-09 (×2): qty 100

## 2018-04-09 MED ORDER — METOPROLOL SUCCINATE ER 25 MG PO TB24
12.5000 mg | ORAL_TABLET | Freq: Every day | ORAL | Status: DC
  Administered 2018-04-09 – 2018-04-17 (×8): 12.5 mg via ORAL
  Filled 2018-04-09 (×8): qty 1

## 2018-04-09 MED ORDER — ADULT MULTIVITAMIN W/MINERALS CH
1.0000 | ORAL_TABLET | Freq: Every day | ORAL | Status: DC
  Administered 2018-04-11 – 2018-04-17 (×7): 1 via ORAL
  Filled 2018-04-09 (×7): qty 1

## 2018-04-09 MED ORDER — PANTOPRAZOLE SODIUM 40 MG PO TBEC
40.0000 mg | DELAYED_RELEASE_TABLET | Freq: Every day | ORAL | Status: DC
  Administered 2018-04-11 – 2018-04-17 (×7): 40 mg via ORAL
  Filled 2018-04-09 (×7): qty 1

## 2018-04-09 MED ORDER — IOPAMIDOL (ISOVUE-370) INJECTION 76%
INTRAVENOUS | Status: AC
  Filled 2018-04-09: qty 100

## 2018-04-09 MED ORDER — HYDRALAZINE HCL 20 MG/ML IJ SOLN: 5.0000 mg | INTRAMUSCULAR | Status: DC | PRN

## 2018-04-09 MED ORDER — SODIUM CHLORIDE 0.9% IV SOLUTION: Freq: Once | INTRAVENOUS | Status: DC

## 2018-04-09 MED ORDER — SODIUM CHLORIDE (PF) 0.9 % IJ SOLN
INTRAMUSCULAR | Status: AC
  Filled 2018-04-09: qty 50

## 2018-04-09 MED ORDER — ACETAMINOPHEN 650 MG RE SUPP: 650.0000 mg | Freq: Four times a day (QID) | RECTAL | Status: DC | PRN

## 2018-04-09 MED ORDER — SODIUM CHLORIDE 0.9 % IV SOLN
INTRAVENOUS | Status: AC
  Administered 2018-04-10: via INTRAVENOUS

## 2018-04-09 MED ORDER — DOCUSATE SODIUM 100 MG PO CAPS
100.0000 mg | ORAL_CAPSULE | Freq: Two times a day (BID) | ORAL | Status: DC
  Administered 2018-04-09 – 2018-04-17 (×13): 100 mg via ORAL
  Filled 2018-04-09 (×14): qty 1

## 2018-04-09 MED ORDER — GABAPENTIN 300 MG PO CAPS
600.0000 mg | ORAL_CAPSULE | Freq: Every day | ORAL | Status: DC
  Administered 2018-04-09 – 2018-04-16 (×8): 600 mg via ORAL
  Filled 2018-04-09 (×8): qty 2

## 2018-04-09 MED ORDER — ACETAMINOPHEN 325 MG PO TABS
650.0000 mg | ORAL_TABLET | Freq: Four times a day (QID) | ORAL | Status: DC | PRN
  Administered 2018-04-10: 650 mg via ORAL
  Filled 2018-04-09: qty 2

## 2018-04-09 MED ORDER — CALCIUM CARBONATE-VITAMIN D 500-200 MG-UNIT PO TABS
1.0000 | ORAL_TABLET | Freq: Every day | ORAL | Status: DC
  Administered 2018-04-11 – 2018-04-17 (×7): 1 via ORAL
  Filled 2018-04-09 (×8): qty 1

## 2018-04-09 MED ORDER — GABAPENTIN 300 MG PO CAPS
300.0000 mg | ORAL_CAPSULE | Freq: Two times a day (BID) | ORAL | Status: DC
  Administered 2018-04-11 – 2018-04-17 (×12): 300 mg via ORAL
  Filled 2018-04-09 (×12): qty 1

## 2018-04-09 MED ORDER — LEVOTHYROXINE SODIUM 88 MCG PO TABS
88.0000 ug | ORAL_TABLET | Freq: Every day | ORAL | Status: DC
  Administered 2018-04-09 – 2018-04-16 (×8): 88 ug via ORAL
  Filled 2018-04-09 (×8): qty 1

## 2018-04-09 MED ORDER — ONE-DAILY MULTI VITAMINS PO TABS: 1.0000 | ORAL_TABLET | Freq: Every day | ORAL | Status: DC

## 2018-04-09 MED ORDER — CALCIUM CARBONATE-VITAMIN D 500-125 MG-UNIT PO TABS: 1.0000 | ORAL_TABLET | Freq: Every day | ORAL | Status: DC

## 2018-04-09 MED ORDER — IOPAMIDOL (ISOVUE-370) INJECTION 76%
100.0000 mL | Freq: Once | INTRAVENOUS | Status: AC | PRN
  Administered 2018-04-09: 100 mL via INTRAVENOUS

## 2018-04-09 MED ORDER — HYDROCODONE-ACETAMINOPHEN 5-325 MG PO TABS
1.0000 | ORAL_TABLET | ORAL | Status: DC | PRN
  Administered 2018-04-10 – 2018-04-17 (×15): 1 via ORAL
  Filled 2018-04-09 (×16): qty 1

## 2018-04-09 MED ORDER — BIOTIN 1000 MCG PO TABS: 1000.0000 ug | ORAL_TABLET | Freq: Every day | ORAL | Status: DC

## 2018-04-09 NOTE — ED Notes (Signed)
Dr. Scot Dock called @ 1752(aware if Pt in ED)-per Dr. Johny Chess by Levada Dy

## 2018-04-09 NOTE — Telephone Encounter (Signed)
Thank you Percell Miller!!

## 2018-04-09 NOTE — Consult Note (Signed)
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  Consult is requested by the emergency department.  HPI:   [name] is a pleasant 85 y.o. female, who underwent a right hip replacement 4 years ago.  She had a revision of her hip in October 2019.  She subsequently fell in November 2019 and dislocated her hip.  This was relocated.  In December of last year she fell again and dislocated her hip.  Attempts were made to relocate the hip twice until she subsequently underwent revision of her hip on January 24, 2018.  She states that she had noted swelling in the right groin since that time.  This has been stable.  However, the pain in the right groin has started to increase over the last week.  She was seen by her primary care physician and sent for an ultrasound which showed a large pseudoaneurysm.  She subsequently went to [facility] long emergency department and had a CT scan which shows a large right femoral pseudoaneurysm originating off the common femoral artery.  In addition, on my history she describes the acute onset of pain in her right calf a week ago.  She describes pain in her calf which is brought on by ambulation and relieved with rest on the right side.  She also noted that the foot at times was cooler and discolored.  An incidental finding on her CT scan today was an abrupt occlusion of the popliteal artery.  I suspect that this happened a week ago although the etiology is somewhat unclear and that she has no history of atrial fibrillation or obvious source for embolization.  The patient denies any history of myocardial infarction or history of congestive heart failure.  She is had no recent chest pain.  Past Medical History:  Diagnosis Date  . Arthralgia of multiple joints    followed by dr Gerilyn Nestle  . Arthritis   . Cardiomyopathy ([place])   . Chronic constipation   . Chronic inflammatory arthritis    rhemotolgist-  dr a. Gerilyn Nestle [facility]  [place])  . Dry eyes    eye drops used   . GERD (gastroesophageal reflux disease)   . H/O discoid lupus erythematosus   . Hiatal hernia   . History of colon polyps   . Hypothyroidism   . Iron deficiency anemia   . LBBB (left bundle branch block) 2010  . Mitchell's disease (erythromelalgia) [facility])    neurologist-  dr patel  . Nocturia   . Non-small cell cancer of right lung [facility]) surgeon-- dr gerhardt/  oncologist-  dr Julien Nordmann--- per lov notes no recurrence/   11-18-2017 per pt denies any symptoms   dx 2015--  Stage IIA (T2b,N0,M0) , +EGFR  mutation in exon 21, non-small cell adenocarcinoma right upper lobe---  s/p  Right upper lobectomy , right middley wedge resection and node dissection---  no chemo or radiation therapy  . OA (osteoarthritis)    hands  . Osteoporosis   . PONV (postoperative nausea and vomiting)    likes phenergan  . Raynaud's phenomenon 1965  . Renal insufficiency   . Rheumatoid arthritis ([place])   . Sciatica   . Scoliosis   . Sjogren's syndrome ([place])     Family History  Problem Relation Age of Onset  . Coronary artery disease Father   . Colon cancer Father   . Diabetes Father   . Cancer Father        colon  .  Other Mother 22       MVA  . Healthy Sister   . Other Brother        Killed on war  . Healthy Daughter   . Esophageal cancer Neg Hx   . Kidney disease Neg Hx   . Liver disease Neg Hx     SOCIAL HISTORY: Social History   Socioeconomic History  . Marital status: Widowed    Spouse name: Not on file  . Number of children: 2  . Years of education: Not on file  . Highest education level: Not on file  Occupational History  . Occupation: n/a  Social Needs  . Financial resource strain: Not on file  . Food insecurity:    Worry: Not on file    Inability: Not on file  . Transportation needs:    Medical: Not on file    Non-medical: Not on file  Tobacco Use  . Smoking status: Never Smoker  . Smokeless tobacco: Never Used  Substance and  Sexual Activity  . Alcohol use: Not Currently    Frequency: Never  . Drug use: Never  . Sexual activity: Not Currently    Birth control/protection: Surgical  Lifestyle  . Physical activity:    Days per week: Not on file    Minutes per session: Not on file  . Stress: Not on file  Relationships  . Social connections:    Talks on phone: Not on file    Gets together: Not on file    Attends religious service: Not on file    Active member of club or organization: Not on file    Attends meetings of clubs or organizations: Not on file    Relationship status: Not on file  . Intimate partner violence:    Fear of current or ex partner: Not on file    Emotionally abused: Not on file    Physically abused: Not on file    Forced sexual activity: Not on file  Other Topics Concern  . Not on file  Social History Narrative   Lives with husband, daughter and grandchild local.   Highest level of education:  masters in education admin and technology    Allergies  Allergen Reactions  . Amlodipine Rash  . Prochlorperazine Edisylate Anaphylaxis    Compazine--- tongue swells and rash  . Aspirin Other (See Comments)    nose bleeds  . Cymbalta [Duloxetine Hcl] Diarrhea, Nausea And Vomiting and Other (See Comments)    Increased blood pressure  . Pamelor [Nortriptyline Hcl] Diarrhea and Nausea Only    Increased Heart rate and BP    Current Facility-Administered Medications  Medication Dose Route Frequency Provider Last Rate Last Dose  . 0.9 %  sodium chloride infusion (Manually program via Guardrails IV Fluids)   Intravenous Once Angelia Mould, MD      . Derrill Memo ON 04/10/2018] ceFAZolin (ANCEF) IVPB 1 g/50 mL premix  1 g Intravenous On Call Angelia Mould, MD      . iopamidol (ISOVUE-370) 76 % injection           . sodium chloride (PF) 0.9 % injection            Current Outpatient Medications  Medication Sig Dispense Refill  . Biotin 1000 MCG tablet Take 1,000 mcg by mouth  daily.     . Calcium Carbonate-Vitamin D (CALCIUM 500 + D) 500-125 MG-UNIT TABS Take 1 tablet by mouth daily.     Marland Kitchen docusate sodium (STOOL SOFTENER)  100 MG capsule Take 100 mg by mouth 2 (two) times daily.      . furosemide (LASIX) 20 MG tablet TAKE 1 TABLET BY MOUTH EVERY DAY (Patient taking differently: Take 20 mg by mouth daily. ) 30 tablet 0  . gabapentin (NEURONTIN) 300 MG capsule TAKE '300MG'$  IN THE MORNING, '300MG'$  IN THE AFTERNOON, AND '600MG'$  AT BEDTIME. (Patient taking differently: Take 300-600 mg by mouth See admin instructions. Take '300mg'$  in the morning, '300mg'$  in the afternoon, and '600mg'$  at bedtime.) 120 capsule 5  . Glucosamine-Chondroit-Vit C-Mn (GLUCOSAMINE CHONDR 1500 COMPLX PO) Take 1 capsule by mouth daily.     Marland Kitchen HYDROcodone-acetaminophen (NORCO/VICODIN) 5-325 MG tablet Take 1 tablet by mouth every 4 (four) hours as needed for severe pain. 40 tablet 0  . levothyroxine (SYNTHROID, LEVOTHROID) 88 MCG tablet TAKE 1 TABLET (88 MCG TOTAL) BY MOUTH DAILY BEFORE BREAKFAST. (Patient taking differently: Take 88 mcg by mouth at bedtime. Per pt takes at 10pm) 90 tablet 1  . meloxicam (MOBIC) 7.5 MG tablet Take 7.5 mg by mouth 2 (two) times daily as needed for pain.    . methocarbamol (ROBAXIN) 500 MG tablet TAKE 1 TABLET (500 MG TOTAL) BY MOUTH EVERY 6 (SIX) HOURS AS NEEDED FOR MUSCLE SPASMS. 40 tablet 0  . Multiple Vitamin (MULTIVITAMIN) tablet Take 1 tablet by mouth daily.      Marland Kitchen omeprazole (PRILOSEC) 20 MG capsule TAKE 1 CAPSULE BY MOUTH 2 TIMES DAILY BEFORE A MEAL. (Patient taking differently: Take 20 mg by mouth 2 (two) times daily before a meal. TAKE 1 CAPSULE BY MOUTH 2 TIMES DAILY BEFORE A MEAL.) 180 capsule 1  . spironolactone (ALDACTONE) 25 MG tablet Take 12.5 mg by mouth every morning.   2  . doxycycline (VIBRA-TABS) 100 MG tablet Take 1 tablet (100 mg total) by mouth 2 (two) times daily. (Patient not taking: Reported on 04/09/2018) 30 tablet 0  . metoprolol succinate (TOPROL XL) 25 MG 24 hr  tablet Take 0.5 tablets (12.5 mg total) by mouth daily. 45 tablet 3    REVIEW OF SYSTEMS:  '[X]'$  denotes positive finding, '[ ]'$  denotes negative finding Cardiac  Comments:  Chest pain or chest pressure:    Shortness of breath upon exertion:    Short of breath when lying flat:    Irregular heart rhythm:        Vascular    Pain in calf, thigh, or hip brought on by ambulation: x  right calf x1 week  Pain in feet at night that wakes you up from your sleep:     Blood clot in your veins:    Leg swelling:  x  right thigh      Pulmonary    Oxygen at home:    Productive cough:     Wheezing:         Neurologic    Sudden weakness in arms or legs:     Sudden numbness in arms or legs:     Sudden onset of difficulty speaking or slurred speech:    Temporary loss of vision in one eye:     Problems with dizziness:         Gastrointestinal    Blood in stool:     Vomited blood:         Genitourinary    Burning when urinating:     Blood in urine:        Psychiatric    Major depression:  Hematologic    Bleeding problems:    Problems with blood clotting too easily:        Skin    Rashes or ulcers:        Constitutional    Fever or chills:     PHYSICAL EXAM:   Vitals:   04/09/18 1320 04/09/18 1448 04/09/18 1632 04/09/18 1700  BP: (!) 153/96 139/79 (!) 151/78 (!) 156/84  Pulse: 95 73 74 81  Resp: '15 15 16 17  '$ Temp: 98.3 F (36.8 C)     TempSrc: Oral     SpO2: 100% 98% 98% 97%   GENERAL: The patient is a well-nourished female, in no acute distress. The vital signs are documented above. CARDIAC: There is a regular rate and rhythm.  VASCULAR: I do not detect carotid bruits. On the right side, which is the side of concern, she has a pulsatile mass in the right groin. She has a palpable popliteal pulse. She has a monophasic but fairly brisk dorsalis and posterior tibial signal on the right with the Doppler.  The right foot is pink and appears well perfused. On the left side  she has a palpable femoral, popliteal, and dorsalis pedis pulse. She has moderate right lower extremity swelling. PULMONARY: There is good air exchange bilaterally without wheezing or rales. ABDOMEN: Soft and non-tender with normal pitched bowel sounds.  MUSCULOSKELETAL: There are no major deformities or cyanosis. NEUROLOGIC: No focal weakness or paresthesias are detected. SKIN: She has cellulitis in the right calf. PSYCHIATRIC: The patient has a normal affect.  DATA:    CT SCAN: I have reviewed the images of her CT scan that was done [facility].  She has a large pseudoaneurysm which originates off of the right common femoral artery.  In addition there is an abrupt occlusion of the popliteal artery at the level of the knee with reconstitution of the anterior tibial, posterior tibial, and peroneal arteries.  DUPLEX: I have also reviewed the images and videos of her duplex of the right groin which shows a large complex mass in the right groin and evidence of a pseudoaneurysm with flow into this large complex mass.  LABS: Her creatinine is 1.1.  GFR is 46.  Potassium 4.7. White blood cell count is 4.7.  Hemoglobin is 8.0.  Hematocrit 26.8.  Platelet count 152,000.  ASSESSMENT & PLAN:   RIGHT FEMORAL ARTERY PSEUDOANEURYSM: This patient has a large right femoral artery pseudoaneurysm which appears to originate off of the common femoral artery.  The swelling in the right thigh began after her procedures on December 8.  She had 2 attempts at relocation of the hip and then ultimately required surgery.  She is had no other catheterization in the right groin.  I suspect that perhaps there was a small tear to a branch of the common femoral artery that may explain this.  Regardless the aneurysm is quite large and is now becoming somewhat tender.  She ate at 1 PM today and given that she has had this for some time I do not think that this needs to be done urgently and is scheduled for  tomorrow morning.  I have discussed with the patient and her daughter the risks of the procedure including the risk of wound healing problems and bleeding complications.  RIGHT POPLITEAL ARTERY EMBOLUS: Patient also has an abrupt occlusion of the right popliteal artery.  Given her history of the sudden onset of pain a week ago I suspect that this  is an embolic event and that this is not a chronic occlusion.  She does however have brisk Doppler signals in the foot although monophasic.  I have recommended exploration of the popliteal artery at the time of her surgery tomorrow and attempted popliteal and tibial embolectomy.  I have explained that given that this clot is likely been there for a week there is some risk of it being adherent and difficult to retrieve.  ANEMIA: Her hemoglobin is 8.0.  Given her age she may benefit from preoperative transfusion but I will leave that to the discretion of the medical service.  She is scheduled for surgery tomorrow morning.  I explained the risks and benefits and all of the patient's and her daughter's questions were answered and they are agreeable to proceed.   [name] [facility] [phone]

## 2018-04-09 NOTE — ED Notes (Signed)
Bed: WA20 Expected date:  Expected time:  Means of arrival:  Comments: 

## 2018-04-09 NOTE — H&P (View-Only) (Signed)
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  Consult is requested by the emergency department.  HPI:   [name] is a pleasant 85 y.o. female, who underwent a right hip replacement 4 years ago.  She had a revision of her hip in October 2019.  She subsequently fell in November 2019 and dislocated her hip.  This was relocated.  In December of last year she fell again and dislocated her hip.  Attempts were made to relocate the hip twice until she subsequently underwent revision of her hip on January 24, 2018.  She states that she had noted swelling in the right groin since that time.  This has been stable.  However, the pain in the right groin has started to increase over the last week.  She was seen by her primary care physician and sent for an ultrasound which showed a large pseudoaneurysm.  She subsequently went to [facility] long emergency department and had a CT scan which shows a large right femoral pseudoaneurysm originating off the common femoral artery.  In addition, on my history she describes the acute onset of pain in her right calf a week ago.  She describes pain in her calf which is brought on by ambulation and relieved with rest on the right side.  She also noted that the foot at times was cooler and discolored.  An incidental finding on her CT scan today was an abrupt occlusion of the popliteal artery.  I suspect that this happened a week ago although the etiology is somewhat unclear and that she has no history of atrial fibrillation or obvious source for embolization.  The patient denies any history of myocardial infarction or history of congestive heart failure.  She is had no recent chest pain.  Past Medical History:  Diagnosis Date  . Arthralgia of multiple joints    followed by dr Gerilyn Nestle  . Arthritis   . Cardiomyopathy ([place])   . Chronic constipation   . Chronic inflammatory arthritis    rhemotolgist-  dr a. Gerilyn Nestle [facility]  [place]  . Dry eyes    eye drops used   . GERD (gastroesophageal reflux disease)   . H/O discoid lupus erythematosus   . Hiatal hernia   . History of colon polyps   . Hypothyroidism   . Iron deficiency anemia   . LBBB (left bundle branch block) 2010  . Mitchell's disease (erythromelalgia) [facility])    neurologist-  dr patel  . Nocturia   . Non-small cell cancer of right lung [facility]) surgeon-- dr gerhardt/  oncologist-  dr Julien Nordmann--- per lov notes no recurrence/   11-18-2017 per pt denies any symptoms   dx 2015--  Stage IIA (T2b,N0,M0) , +EGFR  mutation in exon 21, non-small cell adenocarcinoma right upper lobe---  s/p  Right upper lobectomy , right middley wedge resection and node dissection---  no chemo or radiation therapy  . OA (osteoarthritis)    hands  . Osteoporosis   . PONV (postoperative nausea and vomiting)    likes phenergan  . Raynaud's phenomenon 1965  . Renal insufficiency   . Rheumatoid arthritis ([place])   . Sciatica   . Scoliosis   . Sjogren's syndrome ([place])     Family History  Problem Relation Age of Onset  . Coronary artery disease Father   . Colon cancer Father   . Diabetes Father   . Cancer Father        colon  .  Other Mother 68       MVA  . Healthy Sister   . Other Brother        Killed on war  . Healthy Daughter   . Esophageal cancer Neg Hx   . Kidney disease Neg Hx   . Liver disease Neg Hx     SOCIAL HISTORY: Social History   Socioeconomic History  . Marital status: Widowed    Spouse name: Not on file  . Number of children: 2  . Years of education: Not on file  . Highest education level: Not on file  Occupational History  . Occupation: n/a  Social Needs  . Financial resource strain: Not on file  . Food insecurity:    Worry: Not on file    Inability: Not on file  . Transportation needs:    Medical: Not on file    Non-medical: Not on file  Tobacco Use  . Smoking status: Never Smoker  . Smokeless tobacco: Never Used  Substance and  Sexual Activity  . Alcohol use: Not Currently    Frequency: Never  . Drug use: Never  . Sexual activity: Not Currently    Birth control/protection: Surgical  Lifestyle  . Physical activity:    Days per week: Not on file    Minutes per session: Not on file  . Stress: Not on file  Relationships  . Social connections:    Talks on phone: Not on file    Gets together: Not on file    Attends religious service: Not on file    Active member of club or organization: Not on file    Attends meetings of clubs or organizations: Not on file    Relationship status: Not on file  . Intimate partner violence:    Fear of current or ex partner: Not on file    Emotionally abused: Not on file    Physically abused: Not on file    Forced sexual activity: Not on file  Other Topics Concern  . Not on file  Social History Narrative   Lives with husband, daughter and grandchild local.   Highest level of education:  masters in education admin and technology    Allergies  Allergen Reactions  . Amlodipine Rash  . Prochlorperazine Edisylate Anaphylaxis    Compazine--- tongue swells and rash  . Aspirin Other (See Comments)    nose bleeds  . Cymbalta [Duloxetine Hcl] Diarrhea, Nausea And Vomiting and Other (See Comments)    Increased blood pressure  . Pamelor [Nortriptyline Hcl] Diarrhea and Nausea Only    Increased Heart rate and BP    Current Facility-Administered Medications  Medication Dose Route Frequency Provider Last Rate Last Dose  . 0.9 %  sodium chloride infusion (Manually program via Guardrails IV Fluids)   Intravenous Once Angelia Mould, MD      . Derrill Memo ON 04/10/2018] ceFAZolin (ANCEF) IVPB 1 g/50 mL premix  1 g Intravenous On Call Angelia Mould, MD      . iopamidol (ISOVUE-370) 76 % injection           . sodium chloride (PF) 0.9 % injection            Current Outpatient Medications  Medication Sig Dispense Refill  . Biotin 1000 MCG tablet Take 1,000 mcg by mouth  daily.     . Calcium Carbonate-Vitamin D (CALCIUM 500 + D) 500-125 MG-UNIT TABS Take 1 tablet by mouth daily.     Marland Kitchen docusate sodium (STOOL SOFTENER)  100 MG capsule Take 100 mg by mouth 2 (two) times daily.      . furosemide (LASIX) 20 MG tablet TAKE 1 TABLET BY MOUTH EVERY DAY (Patient taking differently: Take 20 mg by mouth daily. ) 30 tablet 0  . gabapentin (NEURONTIN) 300 MG capsule TAKE '300MG'$  IN THE MORNING, '300MG'$  IN THE AFTERNOON, AND '600MG'$  AT BEDTIME. (Patient taking differently: Take 300-600 mg by mouth See admin instructions. Take '300mg'$  in the morning, '300mg'$  in the afternoon, and '600mg'$  at bedtime.) 120 capsule 5  . Glucosamine-Chondroit-Vit C-Mn (GLUCOSAMINE CHONDR 1500 COMPLX PO) Take 1 capsule by mouth daily.     Marland Kitchen HYDROcodone-acetaminophen (NORCO/VICODIN) 5-325 MG tablet Take 1 tablet by mouth every 4 (four) hours as needed for severe pain. 40 tablet 0  . levothyroxine (SYNTHROID, LEVOTHROID) 88 MCG tablet TAKE 1 TABLET (88 MCG TOTAL) BY MOUTH DAILY BEFORE BREAKFAST. (Patient taking differently: Take 88 mcg by mouth at bedtime. Per pt takes at 10pm) 90 tablet 1  . meloxicam (MOBIC) 7.5 MG tablet Take 7.5 mg by mouth 2 (two) times daily as needed for pain.    . methocarbamol (ROBAXIN) 500 MG tablet TAKE 1 TABLET (500 MG TOTAL) BY MOUTH EVERY 6 (SIX) HOURS AS NEEDED FOR MUSCLE SPASMS. 40 tablet 0  . Multiple Vitamin (MULTIVITAMIN) tablet Take 1 tablet by mouth daily.      Marland Kitchen omeprazole (PRILOSEC) 20 MG capsule TAKE 1 CAPSULE BY MOUTH 2 TIMES DAILY BEFORE A MEAL. (Patient taking differently: Take 20 mg by mouth 2 (two) times daily before a meal. TAKE 1 CAPSULE BY MOUTH 2 TIMES DAILY BEFORE A MEAL.) 180 capsule 1  . spironolactone (ALDACTONE) 25 MG tablet Take 12.5 mg by mouth every morning.   2  . doxycycline (VIBRA-TABS) 100 MG tablet Take 1 tablet (100 mg total) by mouth 2 (two) times daily. (Patient not taking: Reported on 04/09/2018) 30 tablet 0  . metoprolol succinate (TOPROL XL) 25 MG 24 hr  tablet Take 0.5 tablets (12.5 mg total) by mouth daily. 45 tablet 3    REVIEW OF SYSTEMS:  '[X]'$  denotes positive finding, '[ ]'$  denotes negative finding Cardiac  Comments:  Chest pain or chest pressure:    Shortness of breath upon exertion:    Short of breath when lying flat:    Irregular heart rhythm:        Vascular    Pain in calf, thigh, or hip brought on by ambulation: x  right calf x1 week  Pain in feet at night that wakes you up from your sleep:     Blood clot in your veins:    Leg swelling:  x  right thigh      Pulmonary    Oxygen at home:    Productive cough:     Wheezing:         Neurologic    Sudden weakness in arms or legs:     Sudden numbness in arms or legs:     Sudden onset of difficulty speaking or slurred speech:    Temporary loss of vision in one eye:     Problems with dizziness:         Gastrointestinal    Blood in stool:     Vomited blood:         Genitourinary    Burning when urinating:     Blood in urine:        Psychiatric    Major depression:  Hematologic    Bleeding problems:    Problems with blood clotting too easily:        Skin    Rashes or ulcers:        Constitutional    Fever or chills:     PHYSICAL EXAM:   Vitals:   04/09/18 1320 04/09/18 1448 04/09/18 1632 04/09/18 1700  BP: (!) 153/96 139/79 (!) 151/78 (!) 156/84  Pulse: 95 73 74 81  Resp: '15 15 16 17  '$ Temp: 98.3 F (36.8 C)     TempSrc: Oral     SpO2: 100% 98% 98% 97%   GENERAL: The patient is a well-nourished female, in no acute distress. The vital signs are documented above. CARDIAC: There is a regular rate and rhythm.  VASCULAR: I do not detect carotid bruits. On the right side, which is the side of concern, she has a pulsatile mass in the right groin. She has a palpable popliteal pulse. She has a monophasic but fairly brisk dorsalis and posterior tibial signal on the right with the Doppler.  The right foot is pink and appears well perfused. On the left side  she has a palpable femoral, popliteal, and dorsalis pedis pulse. She has moderate right lower extremity swelling. PULMONARY: There is good air exchange bilaterally without wheezing or rales. ABDOMEN: Soft and non-tender with normal pitched bowel sounds.  MUSCULOSKELETAL: There are no major deformities or cyanosis. NEUROLOGIC: No focal weakness or paresthesias are detected. SKIN: She has cellulitis in the right calf. PSYCHIATRIC: The patient has a normal affect.  DATA:    CT SCAN: I have reviewed the images of her CT scan that was done today at [facility].  She has a large pseudoaneurysm which originates off of the right common femoral artery.  In addition there is an abrupt occlusion of the popliteal artery at the level of the knee with reconstitution of the anterior tibial, posterior tibial, and peroneal arteries.  DUPLEX: I have also reviewed the images and videos of her duplex of the right groin which shows a large complex mass in the right groin and evidence of a pseudoaneurysm with flow into this large complex mass.  LABS: Her creatinine is 1.1.  GFR is 46.  Potassium 4.7. White blood cell count is 4.7.  Hemoglobin is 8.0.  Hematocrit 26.8.  Platelet count 152,000.  ASSESSMENT & PLAN:   RIGHT FEMORAL ARTERY PSEUDOANEURYSM: This patient has a large right femoral artery pseudoaneurysm which appears to originate off of the common femoral artery.  The swelling in the right thigh began after her procedures on December 8.  She had 2 attempts at relocation of the hip and then ultimately required surgery.  She is had no other catheterization in the right groin.  I suspect that perhaps there was a small tear to a branch of the common femoral artery that may explain this.  Regardless the aneurysm is quite large and is now becoming somewhat tender.  She ate at 1 PM today and given that she has had this for some time I do not think that this needs to be done urgently and is scheduled for  tomorrow morning.  I have discussed with the patient and her daughter the risks of the procedure including the risk of wound healing problems and bleeding complications.  RIGHT POPLITEAL ARTERY EMBOLUS: Patient also has an abrupt occlusion of the right popliteal artery.  Given her history of the sudden onset of pain a week ago I suspect that this  is an embolic event and that this is not a chronic occlusion.  She does however have brisk Doppler signals in the foot although monophasic.  I have recommended exploration of the popliteal artery at the time of her surgery tomorrow and attempted popliteal and tibial embolectomy.  I have explained that given that this clot is likely been there for a week there is some risk of it being adherent and difficult to retrieve.  ANEMIA: Her hemoglobin is 8.0.  Given her age she may benefit from preoperative transfusion but I will leave that to the discretion of the medical service.  She is scheduled for surgery tomorrow morning.  I explained the risks and benefits and all of the patient's and her daughter's questions were answered and they are agreeable to proceed.   [name] [facility] [phone]

## 2018-04-09 NOTE — Progress Notes (Signed)
Patient ID: [name], female   DOB: 1930/06/10, 85 y.o.   MRN: [id number] The patient is well-known to me.  She is someone who had a revision of a failed acetabular component of a right total hip arthroplasty in October of this past year.  Following surgery she had 2 very hard mechanical falls and sustained a fracture of the tip of the greater trochanter and an unstable hip joint itself with a dislocation.  I took her to the operating room on 24 January 2018 and opened her to her anterior incision and actually put a higher offset liner and the acetabular component and a longer hip ball.  The surgery itself was uncomplicated.  She is someone who is chronic clean anemic so I did have her receive a unit of blood at the time of surgery.  The blood loss recorded at the time of surgery was 450 cc.  I have seen her several times in the office in follow-up since that last surgery on December 8.  At her last visit with [place] earlier this month she still had swelling in her thigh and she had developed some cellulitis in her right lower leg.  Her hemoglobin had actually gone up to 10.  She presented to her primary care physician with continued right thigh swelling.  An ultrasound was performed today and it suggests an AV fistula or pseudoaneurysm in the right groin which is a side we perform surgery on.  I did come to the bedside to take a look at her.  She does have a swollen thigh on that right side and definitely cellulitis in her right lower extremity.  She has a very large palpable pulse in her groin but even as far lateral as her hip incision.  Certainly this is concerning for pseudoaneurysm.  I have spoken with Dr. Joylene Igo with vascular surgery.  I have also spoken to Dr. Thurnell Garbe from the emergency room.  A CT angiogram is going to be ordered to better evaluate her right groin area for potential vascular injury.  She will then likely need to be transferred to [facility] and potential admitted to the  medicine service.  I have talked to her and her daughter about this as well.  I will continue to follow her closely.

## 2018-04-09 NOTE — ED Provider Notes (Signed)
4:25 PM Care assumed from Dr. Thurnell Garbe while awaiting results of CT imaging.  Patient has a history of hip replacement in December and was sent for further evaluation of a pulsatile mass in her groin concerning for aneurysm or pseudoaneurysm.  Vascular surgery was already spoken with by previous team as was orthopedics.  Plan of care is to wait on the CT results and then contact vascular again.  Anticipate admission to either vascular surgery or medicine service for further management.  5:03 PM CT returned showing concern for a pseudoaneurysm on her femoral artery as well as distal occlusive emboli.  Vascular surgery requested she be transferred ED to ED immediately so that they could see her at [facility] and take her to the OR.  She will be made n.p.o. they did not want heparin at this time as she will likely go straight to the OR.   Vascular surgery requested patient have medicine team called for admission after procedure.  Will attempt to call hospitalist service before she is transferred ED to ED.  If admitting team is unable to see her before she is transferred, she would likely need to be evaluated by admitting team over at [facility].  Spoke with Dr. Regenia Skeeter in the emergency department at [facility] who accepted patient in ED to ED transfer.  Dr. Scot Dock with vascular surgery will need to be called upon her arrival as she will likely go to the operating room.  We will speak to medicine for admission after her procedure.    Clinical Impression: 1. Pseudoaneurysm ([place])   2. Right leg pain   3. Arterial thromboembolism ([place])     Disposition: Admit  This note was prepared with assistance of Dragon voice recognition software. Occasional wrong-word or sound-a-like substitutions may have occurred due to the inherent limitations of voice recognition software.       Quaneisha Hanisch, Gwenyth Allegra, MD 04/09/18 2238

## 2018-04-09 NOTE — H&P (Signed)
History and Physical    [name] DOB: [birth year] DOA: 04/09/2018  PCP: Darreld Mclean, MD Patient coming from: Home  Chief Complaint: Right leg pain and swelling  HPI: [name] is a 85 y.o. female with medical history significant of arthritis, GERD, hypothyroidism, LBBB.  Patient underwent right hip replacement 4 years ago.  She had a revision of her hip in October 2019.  Subsequently fell in November 2019 and dislocated her hip.  Her hip was relocated at that time.  In December 2019 she fell again and dislocated the hip.  Attempts were made to relocate the hip twice until she subsequently underwent revision of her right hip on 01/24/2018.  Since then, patient has had swelling and pain in her right groin.  She was seen by her PCP and sent for an ultrasound which showed a large pseudoaneurysm.  She subsequently went to [facility] long ED and had a CT scan which revealed a large right femoral pseudoaneurysm originating of the common femoral artery.  Also revealed occlusion of the right popliteal artery.  History per patient: States her entire right leg has been swollen since her hip replacement surgery on January 24, 2018.  She has also had intermittent pain in her right groin region.  She has tried elevating her leg but it did not help with the swelling.  She went to her PCP 2 days ago and her leg was wrapped with Ace bandage to treat the swelling.  She was sent for an ultrasound today which revealed an abnormality and her doctor asked her to go to the hospital.  Reports having intermittent, sharp pain in her right lower extremity knee down for the past 1 week and her right foot has been turning blue.  Tylenol has been helping with the pain.  Reports having dark stools for the past 3 to 4 weeks which she attributes to eating prunes.  Reports dyspnea and fatigue for the past 2 to 3 days.  No chest pain or dizziness.  States her PCP had done a stool test recently and referred  her to GI for further evaluation.  She has not seen GI yet.  Review of Systems: As per HPI otherwise 10 point review of systems negative.  Past Medical History:  Diagnosis Date  . Arthralgia of multiple joints    followed by dr Gerilyn Nestle  . Arthritis   . Cardiomyopathy ([place])   . Chronic constipation   . Chronic inflammatory arthritis    rhemotolgist-  dr a. Gerilyn Nestle [facility] [place])  . Dry eyes    eye drops used   . GERD (gastroesophageal reflux disease)   . H/O discoid lupus erythematosus   . Hiatal hernia   . History of colon polyps   . Hypothyroidism   . Iron deficiency anemia   . LBBB (left bundle branch block) 2010  . Mitchell's disease (erythromelalgia) [facility])    neurologist-  dr patel  . Nocturia   . Non-small cell cancer of right lung [facility]) surgeon-- dr gerhardt/  oncologist-  dr Julien Nordmann--- per lov notes no recurrence/   11-18-2017 per pt denies any symptoms   dx 2015--  Stage IIA (T2b,N0,M0) , +EGFR  mutation in exon 21, non-small cell adenocarcinoma right upper lobe---  s/p  Right upper lobectomy , right middley wedge resection and node dissection---  no chemo or radiation therapy  . OA (osteoarthritis)    hands  . Osteoporosis   . PONV (postoperative nausea and vomiting)  likes phenergan  . Raynaud's phenomenon 1965  . Renal insufficiency   . Rheumatoid arthritis ([place])   . Sciatica   . Scoliosis   . Sjogren's syndrome [facility])     Past Surgical History:  Procedure Laterality Date  . ANTERIOR HIP REVISION Right 11/27/2017   Procedure: RIGHT HIP ACETABULAR REVISION;  Surgeon: [name], MD;  Location: [facility];  Service: Orthopedics;  Laterality: Right;  . ANTERIOR HIP REVISION Right 01/24/2018   Procedure: OPEN REDUCTION OF DISLOCATED ANTERIOR HIP WITH REVISION OF LINER AND HIP BALL;  Surgeon: [name], MD;  Location: [facility];  Service: Orthopedics;  Laterality: Right;  . APPENDECTOMY  1950s  . CARDIOVASCULAR STRESS TEST  12/2008     mild fixed basal to mid septal perfusion defect felt likely due to artifact from LBBB, no ischemia, EF 58%  . COLONOSCOPY    . LYMPH NODE DISSECTION Right 06/07/2013   Procedure: LYMPH NODE DISSECTION;  Surgeon: [name], MD;  Location: [place];  Service: Thoracic;  Laterality: Right;  . [place]   "large incision from chest to up to shoulder, the nerves were tied together, for raynaud's  . THORACOTOMY  06/07/2013   Procedure: MINI/LIMITED THORACOTOMY; right middle lobe wedge resection;  Surgeon: [name], MD;  Location: [place];  Service: Thoracic;;  . TONSILLECTOMY  child  . TOTAL ABDOMINAL HYSTERECTOMY  1980's    W/ BSO  . TOTAL HIP ARTHROPLASTY Right 04/28/2014   Procedure: RIGHT TOTAL HIP ARTHROPLASTY ANTERIOR APPROACH;  Surgeon: [name], MD;  Location: [facility];  Service: Orthopedics;  Laterality: Right;  . TRANSTHORACIC ECHOCARDIOGRAM  12/11/2008   ef 05-39%, grade 1 diastolic dysfunction/  mild LAE/  mild AR and MR/  trivial TR  . VIDEO ASSISTED THORACOSCOPY (VATS)/WEDGE RESECTION Right 06/07/2013   Procedure: VIDEO ASSISTED THORACOSCOPY (VATS)/right upper lobectomy, On Q;  Surgeon: [name], MD;  Location: [place];  Service: Thoracic;  Laterality: Right;  Marland Kitchen VIDEO BRONCHOSCOPY N/A 06/07/2013   Procedure: VIDEO BRONCHOSCOPY;  Surgeon: [name], MD;  Location: [facility] OR;  Service: Thoracic;  Laterality: N/A;  . VIDEO BRONCHOSCOPY WITH ENDOBRONCHIAL NAVIGATION N/A 05/04/2013   Procedure: VIDEO BRONCHOSCOPY WITH ENDOBRONCHIAL NAVIGATION;  Surgeon: [name], MD;  Location: [place];  Service: Thoracic;  Laterality: N/A;     reports that she has never smoked. She has never used smokeless tobacco. She reports previous alcohol use. She reports that she does not use drugs.  Allergies  Allergen Reactions  . Amlodipine Rash  . Prochlorperazine Edisylate Anaphylaxis    Compazine--- tongue swells and rash  . Aspirin Other (See  Comments)    nose bleeds  . Cymbalta [Duloxetine Hcl] Diarrhea, Nausea And Vomiting and Other (See Comments)    Increased blood pressure  . Pamelor [Nortriptyline Hcl] Diarrhea and Nausea Only    Increased Heart rate and BP    Family History  Problem Relation Age of Onset  . Coronary artery disease Father   . Colon cancer Father   . Diabetes Father   . Cancer Father        colon  . Other Mother 65       MVA  . Healthy Sister   . Other Brother        Killed on war  . Healthy Daughter   . Esophageal cancer Neg Hx   . Kidney disease Neg Hx   . Liver disease Neg Hx  Prior to Admission medications   Medication Sig Start Date End Date Taking? Authorizing Provider  Biotin 1000 MCG tablet Take 1,000 mcg by mouth daily.    Yes [provider]  Calcium Carbonate-Vitamin D (CALCIUM 500 + D) 500-125 MG-UNIT TABS Take 1 tablet by mouth daily.    Yes [provider]  docusate sodium (STOOL SOFTENER) 100 MG capsule Take 100 mg by mouth 2 (two) times daily.     Yes [provider]  furosemide (LASIX) 20 MG tablet TAKE 1 TABLET BY MOUTH EVERY DAY Patient taking differently: Take 20 mg by mouth daily.  03/02/18  Yes [name], MD  gabapentin (NEURONTIN) 300 MG capsule TAKE '300MG'$  IN THE MORNING, '300MG'$  IN THE AFTERNOON, AND '600MG'$  AT BEDTIME. Patient taking differently: Take 300-600 mg by mouth See admin instructions. Take '300mg'$  in the morning, '300mg'$  in the afternoon, and '600mg'$  at bedtime. 12/10/16  Yes Patel, Donika K, DO  Glucosamine-Chondroit-Vit C-Mn (GLUCOSAMINE CHONDR 1500 COMPLX PO) Take 1 capsule by mouth daily.    Yes [provider]  HYDROcodone-acetaminophen (NORCO/VICODIN) 5-325 MG tablet Take 1 tablet by mouth every 4 (four) hours as needed for severe pain. 02/01/18  Yes [name], MD  levothyroxine (SYNTHROID, LEVOTHROID) 88 MCG tablet TAKE 1 TABLET (88 MCG TOTAL) BY MOUTH DAILY BEFORE BREAKFAST. Patient taking  differently: Take 88 mcg by mouth at bedtime. Per pt takes at 10pm 11/03/17  Yes Leamon Arnt, MD  meloxicam (MOBIC) 7.5 MG tablet Take 7.5 mg by mouth 2 (two) times daily as needed for pain.   Yes [provider]  methocarbamol (ROBAXIN) 500 MG tablet TAKE 1 TABLET (500 MG TOTAL) BY MOUTH EVERY 6 (SIX) HOURS AS NEEDED FOR MUSCLE SPASMS. 03/31/18  Yes [name], MD  Multiple Vitamin (MULTIVITAMIN) tablet Take 1 tablet by mouth daily.     Yes [provider]  omeprazole (PRILOSEC) 20 MG capsule TAKE 1 CAPSULE BY MOUTH 2 TIMES DAILY BEFORE A MEAL. Patient taking differently: Take 20 mg by mouth 2 (two) times daily before a meal. TAKE 1 CAPSULE BY MOUTH 2 TIMES DAILY BEFORE A MEAL. 11/03/17  Yes Leamon Arnt, MD  spironolactone (ALDACTONE) 25 MG tablet Take 12.5 mg by mouth every morning.  07/12/14  Yes [provider]  doxycycline (VIBRA-TABS) 100 MG tablet Take 1 tablet (100 mg total) by mouth 2 (two) times daily. Patient not taking: Reported on 04/09/2018 02/22/18   [name], MD  metoprolol succinate (TOPROL XL) 25 MG 24 hr tablet Take 0.5 tablets (12.5 mg total) by mouth daily. 04/07/18   Lelon Perla, MD    Physical Exam: Vitals:   04/09/18 2145 04/09/18 2348 04/10/18 0000 04/10/18 0100  BP: 123/68 (!) 147/80 140/84   Pulse: 79 75 74 (!) 58  Resp: '12 17 20 12  '$ Temp:  98.4 F (36.9 C)    TempSrc:  Oral    SpO2: 97% 99% 99% 98%  Weight:  50.8 kg    Height:  '5\' 2"'$  (1.575 m)      Physical Exam  Constitutional: She is oriented to person, place, and time. She appears well-developed and well-nourished. No distress.  HENT:  Head: Normocephalic.  Mouth/Throat: Oropharynx is clear and moist.  Eyes: Right eye exhibits no discharge. Left eye exhibits no discharge.  Neck: Neck supple.  Cardiovascular: Normal rate, regular rhythm and intact distal pulses.  Pulmonary/Chest: Effort normal and breath sounds normal. No respiratory distress.  She has no  wheezes. She has no rales.  Abdominal: Soft. Bowel sounds are normal. She exhibits no distension. There is no abdominal tenderness. There is no guarding.  Musculoskeletal:        General: Edema present.     Comments: Entire right leg appears edematous.  Erythema and increased warmth to touch noted above the ankle and below the level of the knee.  Neurological: She is alert and oriented to person, place, and time.  Skin: Skin is warm and dry. She is not diaphoretic.  Psychiatric: She has a normal mood and affect. Her behavior is normal.     Labs on Admission: I have personally reviewed following labs and imaging studies  CBC: Recent Labs  Lab 04/09/18 1122 04/09/18 1427  WBC 5.0 4.7  NEUTROABS  --  3.2  HGB 8.8 Repeated and verified X2.* 8.0*  HCT 26.4* 26.8*  MCV 89.8 95.4  PLT 153.0 993   Basic Metabolic Panel: Recent Labs  Lab 04/09/18 1122 04/09/18 1427  NA 132* 132*  K 4.7 4.7  CL 97 99  CO2 29 25  GLUCOSE 82 90  BUN 26* 26*  CREATININE 1.19 1.10*  CALCIUM 8.9 8.7*   GFR: Estimated Creatinine Clearance: 29.6 mL/min (A) (by C-G formula based on SCr of 1.1 mg/dL (H)). Liver Function Tests: No results for input(s): AST, ALT, ALKPHOS, BILITOT, PROT, ALBUMIN in the last 168 hours. No results for input(s): LIPASE, AMYLASE in the last 168 hours. No results for input(s): AMMONIA in the last 168 hours. Coagulation Profile: No results for input(s): INR, PROTIME in the last 168 hours. Cardiac Enzymes: No results for input(s): CKTOTAL, CKMB, CKMBINDEX, TROPONINI in the last 168 hours. BNP (last 3 results) Recent Labs    02/18/18 1422 04/09/18 1122  PROBNP 192.0* 333.0*   HbA1C: No results for input(s): HGBA1C in the last 72 hours. CBG: No results for input(s): GLUCAP in the last 168 hours. Lipid Profile: No results for input(s): CHOL, HDL, LDLCALC, TRIG, CHOLHDL, LDLDIRECT in the last 72 hours. Thyroid Function Tests: No results for input(s): TSH,  T4TOTAL, FREET4, T3FREE, THYROIDAB in the last 72 hours. Anemia Panel: No results for input(s): VITAMINB12, FOLATE, FERRITIN, TIBC, IRON, RETICCTPCT in the last 72 hours. Urine analysis:    Component Value Date/Time   COLORURINE YELLOW 07/29/2014 1037   APPEARANCEUR CLEAR 07/29/2014 1037   LABSPEC 1.013 07/29/2014 1037   PHURINE 7.5 07/29/2014 1037   GLUCOSEU NEGATIVE 07/29/2014 1037   HGBUR NEGATIVE 07/29/2014 1037   HGBUR negative 10/01/2009 1516   BILIRUBINUR negative 07/28/2016 1453   KETONESUR NEGATIVE 07/29/2014 1037   PROTEINUR 0.15 07/28/2016 1453   PROTEINUR NEGATIVE 07/29/2014 1037   UROBILINOGEN 0.2 07/28/2016 1453   UROBILINOGEN 0.2 07/29/2014 1037   NITRITE negative 07/28/2016 1453   NITRITE NEGATIVE 07/29/2014 1037   LEUKOCYTESUR Negative 07/28/2016 1453    Radiological Exams on Admission: Ct Angio Low Extrem Right W &/or Wo Contrast  Result Date: 04/09/2018 CLINICAL DATA:  History of right hip dislocation with subsequent reduction 01/24/2018 now with indeterminate vascular structure within the right groin worrisome for pseudoaneurysm. EXAM: CT ANGIOGRAPHY OF THE RIGHT LOWEREXTREMITY TECHNIQUE: Multidetector CT imaging of the right lower was performed using the standard protocol during bolus administration of intravenous contrast. Multiplanar CT image reconstructions and MIPs were obtained to evaluate the vascular anatomy. CONTRAST:  142m ISOVUE-370 IOPAMIDOL (ISOVUE-370) INJECTION 76% COMPARISON:  Right lower extremity venous Doppler ultrasound-earlier same day FINDINGS: Vascular Findings: Abdominal aorta: Minimal amount within a tortuous but of calcified  atherosclerotic plaque normal caliber abdominal aorta. IMA: Appears patent. -------------------------------------------------------------------------------- RIGHT LOWER EXTREMITY VASCULATURE: Right-sided pelvic vasculature: The right common, external and internal iliac arteries are of normal caliber and widely patent  without a hemodynamically significant narrowing. Right common femoral artery: Widely patent. There is mass effect upon the right common femoral artery secondary to a large (approximately 6.5 x 5.4 x 3.0 cm) pseudoaneurysm adjacent to the lateral distal aspect right common femoral artery (coronal image 53, series 6; axial image 67, series 4). The neck of the pseudoaneurysm measures approximately 0.3 x 0.3 cm (coronal image 49, series 6; axial image 69, series 4). There is suspect surrounding non-opacified hematoma though this is suboptimally evaluated secondary to streak artifact from the patient's right total hip prostheses. Right deep femoral artery: Widely patent without hemodynamically significant narrowing. Right superficial femoral artery: Widely patent without hemodynamically significant narrowing. Right popliteal artery: The right above is normal caliber and widely patent without hemodynamically significant narrowing. There is abrupt occlusion of the distal aspect of the right below-knee popliteal artery (image 214, series 4). Right lower leg: As above, there is abrupt occlusion of the distal aspect the right below-knee popliteal artery. There is reconstitution involving the proximal aspect of the right anterior tibial artery (image 222, series 4) and tibioperoneal trunk (image 226, series 4) via infra geniculate collaterals. The peroneal artery becomes atretic at the level of the ankle mortise. There is short-segment occlusion of the right posterior tibial artery at the level of the hindfoot (image 329, series 4), with early reconstitution. The right anterior tibial artery gives rise to dorsalis pedis artery which appears patent to the level of the forefoot. Review of the MIP images confirms the above findings. -------------------------------------------------------------------------------- Nonvascular Findings: Diffuse anasarca about left leg and thigh. Post right total hip replacement without evidence of  hardware failure or loosening. 2 of the anchoring acetabular screws course through the medial wall the right hemipelvis and terminate within the right iliacus and iliopsoas musculature. Normal appearance of the right kidney. Imaged loops of bowel appear normal. Normal appearance of the urinary bladder. No free fluid in the pelvic cul-de-sac. IMPRESSION: 1. 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 The neck of the aneurysm measures approximately 3 mm in diameter. 2. Short-segment occlusive emboli within the distal aspect of the right popliteal artery extending to involve the proximal aspects of the right anterior tibial artery and the tibioperoneal trunk. 3. Short-segment occlusive embolism within the right popliteal artery at the level of the hindfoot. Critical Value/emergent results were called by telephone at the time of interpretation on 04/09/2018 at 4:39 pm to Dr. Gustavus Messing, who verbally acknowledged these results. Electronically Signed   By: Sandi Mariscal M.D.   On: 04/09/2018 16:48   US Venous Img Lower Unilateral Right  Result Date: 04/09/2018 CLINICAL DATA:  Right lower extremity pain and edema since undergoing hip surgery in December (dislocation and subsequent reduction of right total hip prosthesis). History of lung cancer. Evaluate for DVT. EXAM: RIGHT LOWER EXTREMITY VENOUS DOPPLER ULTRASOUND TECHNIQUE: Gray-scale sonography with graded compression, as well as color Doppler and duplex ultrasound were performed to evaluate the lower extremity deep venous systems from the level of the common femoral vein and including the common femoral, femoral, profunda femoral, popliteal and calf veins including the posterior tibial, peroneal and gastrocnemius veins when visible. The superficial great saphenous vein was also interrogated. Spectral Doppler was utilized to evaluate flow at rest and with distal augmentation maneuvers  in the common femoral, femoral  and popliteal veins. COMPARISON:  Right hip radiographs-01/24/2018 (multiple examinations). FINDINGS: Contralateral Common Femoral Vein: Respiratory phasicity is normal and symmetric with the symptomatic side. No evidence of thrombus. Normal compressibility. Common Femoral Vein: No evidence of thrombus. Normal compressibility, respiratory phasicity and response to augmentation. Saphenofemoral Junction: No evidence of thrombus. Normal compressibility and flow on color Doppler imaging. Profunda Femoral Vein: No evidence of thrombus. Normal compressibility and flow on color Doppler imaging. Femoral Vein: No evidence of thrombus. Normal compressibility, respiratory phasicity and response to augmentation. Popliteal Vein: No evidence of thrombus. Normal compressibility, respiratory phasicity and response to augmentation. Calf Veins: No evidence of thrombus. Normal compressibility and flow on color Doppler imaging. Superficial Great Saphenous Vein: No evidence of thrombus. Normal compressibility. Venous Reflux:  None. Other Findings: There is a large (at least 9.9 x 7.1 x 3.2 cm) fluid collection within the right groin which contains internal pulsatile blood flow (image 12) worrisome for either a pseudoaneurysm (favored) or AV fistula. Note is made of an approximately 3.3 x 0.7 x 1.7 cm anechoic fluid collection with the right popliteal fossa compatible with a Baker's cyst. IMPRESSION: 1. No evidence of DVT within the right lower extremity. 2. Large (at least 9.9 cm) complex fluid collection within the right groin which contains internal blood flow worrisome for either a pseudoaneurysm (favored) or AV fistula, potentially the sequela of recent left total hip dislocation and subsequent reduction. Further evaluation with CTA run-off could be performed as clinically indicated. Critical Value/emergent results were called by telephone at the time of interpretation on 04/09/2018 at 11:34 am to Dr. Evern Core, who verbally  acknowledged these results. Electronically Signed   By: Sandi Mariscal M.D.   On: 04/09/2018 12:01    EKG: Not done in the ED.  EKG has been ordered and currently pending.  Assessment/Plan Principal Problem:   Femoral artery pseudo-aneurysm, right (HCC) Active Problems:   Symptomatic anemia   Elevated cholesterol   Hyponatremia   Popliteal artery occlusion, right (HCC)   HTN (hypertension)   Right large femoral pseudoaneurysm  -CTA with evidence of large (approximately 6.5 cm) narrow neck right common femoral artery pseudoaneurysm. -Patient was seen by vascular surgery.  Plan for surgery in the morning. -Preop cefazolin -Keep n.p.o. after midnight  Right popliteal artery occlusion -CTA with evidence of right popliteal artery occlusive emboli.   -No history of A. fib.  No obvious source of embolization on recent echo done 1/10. -Patient was seen by vascular surgery.  Given onset of pain a week ago, this is thought to be an embolic event and not chronic occlusion.  No signs of tissue ischemia.  Brisk Doppler signals in the foot.  Exploration of the popliteal artery and embolectomy will be done at the time of her surgery tomorrow.  I spoke to Dr. Scot Dock, no heparin recommended at this time given large pseudoaneurysm. -Preop cefazolin -Keep n.p.o. after midnight -Pain management: Continue home Norco PRN and gabapentin.  Tylenol PRN.  Symptomatic blood loss anemia, suspect 2/2 UGIB -Hemoglobin 8.0.  Hemodynamically stable.  Patient reports having dark stools for the past 3 to 4 weeks.  Outpatient FOBT done 1/14 positive and she was supposed to see GI.  No prior EGD in the chart.  Abdominal exam benign and patient is not complaining of any abdominal pain or discomfort.. -Type and screen -1 unit PRBCs -Continue to monitor CBC -Consult GI during this hospitalization.  Mild hyponatremia -Sodium 132.  -Gentle IV fluid  hydration -Continue to monitor BMP  CKD 3 -Stable.  Creatinine 1.1,  at baseline.  Hypertension -Continue home metoprolol -Hydralazine PRN -Hold home diuretic at this time  Hypothyroidism -Continue home Synthroid  GERD -Continue PPI  DVT prophylaxis: No anticoagulation at this time.  Patient will be taken to the OR in the morning. Code Status: Patient wishes to be full code. Family Communication: No family available. Disposition Plan: Anticipate discharge after clinical improvement. Consults called: Vascular surgery Admission status: It is my clinical opinion that admission to INPATIENT is reasonable and necessary in this 85 y.o. female . presenting with symptoms of right leg pain and swelling, concerning for large right femoral pseudoaneurysm and right popliteal artery occlusion . in the context of PMH including: Right hip replacement surgery . with pertinent positives on physical exam including: Right lower extremity edema and erythema. Marland Kitchen and pertinent positives on radiographic and laboratory data including: CTA with evidence of large right femoral pseudo-aneurysm and right popliteal artery occlusion. . Workup and treatment include pain management, surgery in the morning.  Given the aforementioned, the predictability of an adverse outcome is felt to be significant. I expect that the patient will require at least 2 midnights in the hospital to treat this condition.    Shela Leff MD [facility] Pager [phone]  If 7PM-7AM, please contact night-coverage [email or web address] Password [email or web address]  04/10/2018, 1:30 AM

## 2018-04-09 NOTE — ED Triage Notes (Signed)
Arrives as a transfer from [facility] for vascular surgery, intermittent cyanosis and loss of pulses in R foot. Foot is pink on arrival with bilateral distal pulses

## 2018-04-09 NOTE — ED Provider Notes (Signed)
Patient arrived from [place] [facility].  She is here with pseudoaneurysm status post hip dislocation and replacement.  Vascular surgery Dr. Scot Dock is present at bedside.  Will be taken to the operating room then admitted.   Duffy Bruce, MD 04/09/18 [phone]

## 2018-04-09 NOTE — ED Triage Notes (Addendum)
Patient reports she was seen at [facility] this morning and sent for further evaluation of "aneurysm in right hip" after operation on 12/8. States she had [place] this morning.

## 2018-04-09 NOTE — ED Provider Notes (Signed)
[facility] DEPT Provider Note   CSN: [id number] Arrival date & time: 04/09/18  1314    History   Chief Complaint Chief Complaint  Patient presents with  . Hip Pain    HPI [name] is a 85 y.o. female.     HPI  Pt was seen at 1345. Per pt, c/o gradual onset and persistence of constant RLE "swelling" for the past 2 months. States she has had intermittent sharp "pains" in various areas of her RLE.  Pt states her symptoms began after "hip surgery" in December 2019. Pt states her PMD today, had outpatient [place] completed, and was told to come to the ED for further evaluation of "a possibly aneurysm in her leg." Denies fevers, no focal motor weakness, no tingling/numbness in extremities, no abd pain, no CP/SOB, no back pain, no open wounds.    Past Medical History:  Diagnosis Date  . Arthralgia of multiple joints    followed by dr Gerilyn Nestle  . Arthritis   . Cardiomyopathy ([place])   . Chronic constipation   . Chronic inflammatory arthritis    rhemotolgist-  dr a. Gerilyn Nestle [facility])  . Dry eyes    eye drops used   . GERD (gastroesophageal reflux disease)   . H/O discoid lupus erythematosus   . Hiatal hernia   . History of colon polyps   . Hypothyroidism   . Iron deficiency anemia   . LBBB (left bundle branch block) 2010  . Mitchell's disease (erythromelalgia) [facility])    neurologist-  dr patel  . Nocturia   . Non-small cell cancer of right lung [facility]) surgeon-- dr gerhardt/  oncologist-  dr Julien Nordmann--- per lov notes no recurrence/   11-18-2017 per pt denies any symptoms   dx 2015--  Stage IIA (T2b,N0,M0) , +EGFR  mutation in exon 21, non-small cell adenocarcinoma right upper lobe---  s/p  Right upper lobectomy , right middley wedge resection and node dissection---  no chemo or radiation therapy  . OA (osteoarthritis)    hands  . Osteoporosis   . PONV (postoperative nausea and vomiting)    likes phenergan  . Raynaud's phenomenon 1965    . Renal insufficiency   . Rheumatoid arthritis ([place])   . Sciatica   . Scoliosis   . Sjogren's syndrome [facility])     Patient Active Problem List   Diagnosis Date Noted  . Anemia of chronic disease 02/24/2018  . Unstable right hip arthroplasty 01/24/2018  . History of revision of total replacement of right hip joint 01/24/2018  . Hypovolemic shock ([place])   . Hyperkalemia   . Hyponatremia   . Failed total hip arthroplasty ([place]) 11/27/2017  . Status post revision of total hip 11/27/2017  . Elevated cholesterol 10/11/2015  . 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[place]) 10/04/2014  . Bilateral leg edema 08/01/2014  . Elevated BP 08/01/2014  . Rheumatoid arthritis involving multiple joints ([place]) 05/30/2014  . Osteoarthritis of right hip 04/28/2014  . Status post total replacement of right hip 04/28/2014  . Long-term use of high-risk medication 11/11/2013  . Routine general medical examination at a health care facility 11/11/2013  . Anemia, unspecified 11/11/2013  . Neuropathic pain 07/07/2013  . Constipation due to pain medication 07/07/2013  . Protein-calorie malnutrition, severe ([place]) 06/08/2013  . Lung cancer, Right upper lobe 05/08/2013  . Sciatica of right side 08/13/2011  . Osteopenia 03/07/2010  . PARESTHESIA 03/07/2010  . CT, CHEST, ABNORMAL 12/18/2008  . ABNORMAL ECHOCARDIOGRAM 12/14/2008  . SJOGREN'S  SYNDROME 11/29/2008  . HYPOGLYCEMIA 06/29/2006  . RAYNAUD'S DISEASE 06/29/2006    Past Surgical History:  Procedure Laterality Date  . ANTERIOR HIP REVISION Right 11/27/2017   Procedure: RIGHT HIP ACETABULAR REVISION;  Surgeon: [name], MD;  Location: [facility];  Service: Orthopedics;  Laterality: Right;  . ANTERIOR HIP REVISION Right 01/24/2018   Procedure: OPEN REDUCTION OF DISLOCATED ANTERIOR HIP WITH REVISION OF LINER AND HIP BALL;  Surgeon: [name], MD;  Location: [facility];  Service: Orthopedics;  Laterality: Right;  . APPENDECTOMY  1950s  .  CARDIOVASCULAR STRESS TEST  12/2008    mild fixed basal to mid septal perfusion defect felt likely due to artifact from LBBB, no ischemia, EF 58%  . COLONOSCOPY    . LYMPH NODE DISSECTION Right 06/07/2013   Procedure: LYMPH NODE DISSECTION;  Surgeon: [name], MD;  Location: [place];  Service: Thoracic;  Laterality: Right;  . [place]   "large incision from chest to up to shoulder, the nerves were tied together, for raynaud's  . THORACOTOMY  06/07/2013   Procedure: MINI/LIMITED THORACOTOMY; right middle lobe wedge resection;  Surgeon: [name], MD;  Location: [place];  Service: Thoracic;;  . TONSILLECTOMY  child  . TOTAL ABDOMINAL HYSTERECTOMY  1980's    W/ BSO  . TOTAL HIP ARTHROPLASTY Right 04/28/2014   Procedure: RIGHT TOTAL HIP ARTHROPLASTY ANTERIOR APPROACH;  Surgeon: [name], MD;  Location: [facility];  Service: Orthopedics;  Laterality: Right;  . TRANSTHORACIC ECHOCARDIOGRAM  12/11/2008   ef 32-44%, grade 1 diastolic dysfunction/  mild LAE/  mild AR and MR/  trivial TR  . VIDEO ASSISTED THORACOSCOPY (VATS)/WEDGE RESECTION Right 06/07/2013   Procedure: VIDEO ASSISTED THORACOSCOPY (VATS)/right upper lobectomy, On Q;  Surgeon: [name], MD;  Location: [place];  Service: Thoracic;  Laterality: Right;  Marland Kitchen VIDEO BRONCHOSCOPY N/A 06/07/2013   Procedure: VIDEO BRONCHOSCOPY;  Surgeon: [name], MD;  [facility];  Service: Thoracic;  Laterality: N/A;  . VIDEO BRONCHOSCOPY WITH ENDOBRONCHIAL NAVIGATION N/A 05/04/2013   Procedure: VIDEO BRONCHOSCOPY WITH ENDOBRONCHIAL NAVIGATION;  Surgeon: [name], MD;  Location: [facility] OR;  Service: Thoracic;  Laterality: N/A;     OB History    Gravida  4   Para  2   Term      Preterm      AB  2   Living  2     SAB  2   TAB      Ectopic      Multiple      Live Births               Home Medications    Prior to Admission medications   Medication Sig Start Date End  Date Taking? Authorizing Provider  Biotin 1000 MCG tablet Take 1,000 mcg by mouth daily.    Yes [provider]  Calcium Carbonate-Vitamin D (CALCIUM 500 + D) 500-125 MG-UNIT TABS Take 1 tablet by mouth daily.    Yes [provider]  docusate sodium (STOOL SOFTENER) 100 MG capsule Take 100 mg by mouth 2 (two) times daily.     Yes [provider]  furosemide (LASIX) 20 MG tablet TAKE 1 TABLET BY MOUTH EVERY DAY Patient taking differently: Take 20 mg by mouth daily.  03/02/18  Yes [name], MD  gabapentin (NEURONTIN) 300 MG capsule TAKE '300MG'$  IN THE MORNING, '300MG'$  IN THE AFTERNOON, AND '600MG'$  AT BEDTIME. Patient  taking differently: Take 300-600 mg by mouth See admin instructions. Take '300mg'$  in the morning, '300mg'$  in the afternoon, and '600mg'$  at bedtime. 12/10/16  Yes Patel, Donika K, DO  Glucosamine-Chondroit-Vit C-Mn (GLUCOSAMINE CHONDR 1500 COMPLX PO) Take 1 capsule by mouth daily.    Yes [provider]  HYDROcodone-acetaminophen (NORCO/VICODIN) 5-325 MG tablet Take 1 tablet by mouth every 4 (four) hours as needed for severe pain. 02/01/18  Yes [name], MD  levothyroxine (SYNTHROID, LEVOTHROID) 88 MCG tablet TAKE 1 TABLET (88 MCG TOTAL) BY MOUTH DAILY BEFORE BREAKFAST. Patient taking differently: Take 88 mcg by mouth at bedtime. Per pt takes at 10pm 11/03/17  Yes Leamon Arnt, MD  meloxicam (MOBIC) 7.5 MG tablet Take 7.5 mg by mouth 2 (two) times daily as needed for pain.   Yes [provider]  methocarbamol (ROBAXIN) 500 MG tablet TAKE 1 TABLET (500 MG TOTAL) BY MOUTH EVERY 6 (SIX) HOURS AS NEEDED FOR MUSCLE SPASMS. 03/31/18  Yes [name], MD  Multiple Vitamin (MULTIVITAMIN) tablet Take 1 tablet by mouth daily.     Yes [provider]  omeprazole (PRILOSEC) 20 MG capsule TAKE 1 CAPSULE BY MOUTH 2 TIMES DAILY BEFORE A MEAL. Patient taking differently: Take 20 mg by mouth 2 (two) times daily before a  meal. TAKE 1 CAPSULE BY MOUTH 2 TIMES DAILY BEFORE A MEAL. 11/03/17  Yes Leamon Arnt, MD  spironolactone (ALDACTONE) 25 MG tablet Take 12.5 mg by mouth every morning.  07/12/14  Yes [provider]  doxycycline (VIBRA-TABS) 100 MG tablet Take 1 tablet (100 mg total) by mouth 2 (two) times daily. Patient not taking: Reported on 04/09/2018 02/22/18   [name], MD  metoprolol succinate (TOPROL XL) 25 MG 24 hr tablet Take 0.5 tablets (12.5 mg total) by mouth daily. 04/07/18   Lelon Perla, MD    Family History Family History  Problem Relation Age of Onset  . Coronary artery disease Father   . Colon cancer Father   . Diabetes Father   . Cancer Father        colon  . Other Mother 25       MVA  . Healthy Sister   . Other Brother        Killed on war  . Healthy Daughter   . Esophageal cancer Neg Hx   . Kidney disease Neg Hx   . Liver disease Neg Hx     Social History Social History   Tobacco Use  . Smoking status: Never Smoker  . Smokeless tobacco: Never Used  Substance Use Topics  . Alcohol use: Not Currently    Frequency: Never  . Drug use: Never     Allergies   Amlodipine; Prochlorperazine edisylate; Aspirin; Cymbalta [duloxetine hcl]; and Pamelor [nortriptyline hcl]   Review of Systems Review of Systems ROS: Statement: All systems negative except as marked or noted in the HPI; Constitutional: Negative for fever and chills. ; ; Eyes: Negative for eye pain, redness and discharge. ; ; ENMT: Negative for ear pain, hoarseness, nasal congestion, sinus pressure and sore throat. ; ; Cardiovascular: Negative for chest pain, palpitations, diaphoresis, dyspnea and peripheral edema. ; ; Respiratory: Negative for cough, wheezing and stridor. ; ; Gastrointestinal: Negative for nausea, vomiting, diarrhea, abdominal pain, blood in stool, hematemesis, jaundice and rectal bleeding. . ; ; Genitourinary: Negative for dysuria, flank pain and hematuria. ; ;  Musculoskeletal: +RLE swelling. Negative for back pain and neck pain. Negative for trauma.; ;  Skin: Negative for pruritus, rash, abrasions, blisters, bruising and skin lesion.; ; Neuro: Negative for headache, lightheadedness and neck stiffness. Negative for weakness, altered level of consciousness, altered mental status, extremity weakness, paresthesias, involuntary movement, seizure and syncope.       Physical Exam Updated Vital Signs BP 139/79   Pulse 73   Temp 98.3 F (36.8 C) (Oral)   Resp 15   SpO2 98%   Physical Exam 1350: Physical examination:  Nursing notes reviewed; Vital signs and O2 SAT reviewed;  Constitutional: Well developed, Well nourished, Well hydrated, In no acute distress; Head:  Normocephalic, atraumatic; Eyes: EOMI, PERRL, No scleral icterus; ENMT: Mouth and pharynx normal, Mucous membranes moist; Neck: Supple, Full range of motion, No lymphadenopathy; Cardiovascular: Regular rate and rhythm, No gallop; Respiratory: Breath sounds clear & equal bilaterally, No wheezes.  Speaking full sentences with ease, Normal respiratory effort/excursion; Chest: Nontender, Movement normal; Abdomen: Soft, Nontender, Nondistended, Normal bowel sounds; Genitourinary: No CVA tenderness; Extremities: Peripheral pulses normal, +2 edema RLE from foot to groin, +palp bounding pulses right groin/thigh, +erythema to anterior tibial area, +well healed surgical wound right lateral thigh. +ecchymosis left posterior hamstrings area. No deformity. No calf tenderness, +asymmetry.; Neuro: AA&Ox3, Major CN grossly intact.  Speech clear. No gross focal motor or sensory deficits in extremities.; Skin: Color normal, Warm, Dry.   ED Treatments / Results  Labs (all labs ordered are listed, but only abnormal results are displayed)   EKG None  Radiology   Procedures Procedures (including critical care time)  Medications Ordered in ED Medications  sodium chloride (PF) 0.9 % injection (has no  administration in time range)  iopamidol (ISOVUE-370) 76 % injection (has no administration in time range)  iopamidol (ISOVUE-370) 76 % injection 100 mL (100 mLs Intravenous Contrast Given 04/09/18 1546)     Initial Impression / Assessment and Plan / ED Course  I have reviewed the triage vital signs and the nursing notes.  Pertinent labs & imaging results that were available during my care of the patient were reviewed by me and considered in my medical decision making (see chart for details).     MDM Reviewed: previous chart, nursing note and vitals Reviewed previous: labs and ultrasound Interpretation: labs and CT scan       Results for orders placed or performed during the hospital encounter of 63/01/60  Basic metabolic panel  Result Value Ref Range   Sodium 132 (L) 135 - 145 mmol/L   Potassium 4.7 3.5 - 5.1 mmol/L   Chloride 99 98 - 111 mmol/L   CO2 25 22 - 32 mmol/L   Glucose, Bld 90 70 - 99 mg/dL   BUN 26 (H) 8 - 23 mg/dL   Creatinine, Ser 1.10 (H) 0.44 - 1.00 mg/dL   Calcium 8.7 (L) 8.9 - 10.3 mg/dL   GFR calc non Af Amer 46 (L) >60 mL/min   GFR calc Af Amer 53 (L) >60 mL/min   Anion gap 8 5 - 15  CBC with Differential  Result Value Ref Range   WBC 4.7 4.0 - 10.5 K/uL   RBC 2.81 (L) 3.87 - 5.11 MIL/uL   Hemoglobin 8.0 (L) 12.0 - 15.0 g/dL   HCT 26.8 (L) 36.0 - 46.0 %   MCV 95.4 80.0 - 100.0 fL   MCH 28.5 26.0 - 34.0 pg   MCHC 29.9 (L) 30.0 - 36.0 g/dL   RDW 15.8 (H) 11.5 - 15.5 %   Platelets 152 150 - 400 K/uL   nRBC  0.0 0.0 - 0.2 %   Neutrophils Relative % 67 %   Neutro Abs 3.2 1.7 - 7.7 K/uL   Lymphocytes Relative 17 %   Lymphs Abs 0.8 0.7 - 4.0 K/uL   Monocytes Relative 13 %   Monocytes Absolute 0.6 0.1 - 1.0 K/uL   Eosinophils Relative 2 %   Eosinophils Absolute 0.1 0.0 - 0.5 K/uL   Basophils Relative 1 %   Basophils Absolute 0.0 0.0 - 0.1 K/uL   Immature Granulocytes 0 %   Abs Immature Granulocytes 0.02 0.00 - 0.07 K/uL   US Venous Img Lower  Unilateral Right Result Date: 04/09/2018 CLINICAL DATA:  Right lower extremity pain and edema since undergoing hip surgery in December (dislocation and subsequent reduction of right total hip prosthesis). History of lung cancer. Evaluate for DVT. EXAM: RIGHT LOWER EXTREMITY VENOUS DOPPLER ULTRASOUND TECHNIQUE: Gray-scale sonography with graded compression, as well as color Doppler and duplex ultrasound were performed to evaluate the lower extremity deep venous systems from the level of the common femoral vein and including the common femoral, femoral, profunda femoral, popliteal and calf veins including the posterior tibial, peroneal and gastrocnemius veins when visible. The superficial great saphenous vein was also interrogated. Spectral Doppler was utilized to evaluate flow at rest and with distal augmentation maneuvers in the common femoral, femoral and popliteal veins. COMPARISON:  Right hip radiographs-01/24/2018 (multiple examinations). FINDINGS: Contralateral Common Femoral Vein: Respiratory phasicity is normal and symmetric with the symptomatic side. No evidence of thrombus. Normal compressibility. Common Femoral Vein: No evidence of thrombus. Normal compressibility, respiratory phasicity and response to augmentation. Saphenofemoral Junction: No evidence of thrombus. Normal compressibility and flow on color Doppler imaging. Profunda Femoral Vein: No evidence of thrombus. Normal compressibility and flow on color Doppler imaging. Femoral Vein: No evidence of thrombus. Normal compressibility, respiratory phasicity and response to augmentation. Popliteal Vein: No evidence of thrombus. Normal compressibility, respiratory phasicity and response to augmentation. Calf Veins: No evidence of thrombus. Normal compressibility and flow on color Doppler imaging. Superficial Great Saphenous Vein: No evidence of thrombus. Normal compressibility. Venous Reflux:  None. Other Findings: There is a large (at least 9.9 x 7.1 x  3.2 cm) fluid collection within the right groin which contains internal pulsatile blood flow (image 12) worrisome for either a pseudoaneurysm (favored) or AV fistula. Note is made of an approximately 3.3 x 0.7 x 1.7 cm anechoic fluid collection with the right popliteal fossa compatible with a Baker's cyst. IMPRESSION: 1. No evidence of DVT within the right lower extremity. 2. Large (at least 9.9 cm) complex fluid collection within the right groin which contains internal blood flow worrisome for either a pseudoaneurysm (favored) or AV fistula, potentially the sequela of recent left total hip dislocation and subsequent reduction. Further evaluation with CTA run-off could be performed as clinically indicated. Critical Value/emergent results were called by telephone at the time of interpretation on 04/09/2018 at 11:34 am to Dr. Evern Core, who verbally acknowledged these results. Electronically Signed   By: Sandi Mariscal M.D.   On: 04/09/2018 12:01     Results for HALIEY, ROMBERG (MRN [id number]) as of 04/09/2018 16:09  Ref. Range 02/18/2018 14:22 02/22/2018 10:17 02/25/2018 10:44 03/12/2018 10:52 04/09/2018 14:27  Hemoglobin Latest Ref Range: 12.0 - 15.0 g/dL 10.2 (L) 8.0 (L) 9.0 (L) 10.0 (L) 8.0 (L)  HCT Latest Ref Range: 36.0 - 46.0 % 30.9 (L) 25.7 (L) 27.3 (L) 30.1 (L) 26.8 (L)    1400:  T/C returned from Vascular Surgery Dr.  Scot Dock, case discussed, including:  HPI, pertinent PM/SHx, VS/PE, dx testing, ED course and treatment:  Obtain CT-A, inform Ortho MD, pt will likely need admit/transfer to [facility].   1415:  T/C returned from Ortho Dr. Ninfa Linden, case discussed, including:  HPI, pertinent PM/SHx, VS/PE, dx testing, ED course and treatment:  Aware of pt and above US findings, CTA pending.   1445:  Ortho Dr. Ninfa Linden has spoken with Vascular Dr. Scot Dock: if aneurysm on CTA, pt will need admit/transfer to [name].  Both are aware CTA is pending.   1630:  CTA continues pending. Plan as above. Sign out to Dr.  Sherry Ruffing.     Final Clinical Impressions(s) / ED Diagnoses   Final diagnoses:  None    ED Discharge Orders    None       Francine Graven, DO 04/09/18 1634

## 2018-04-09 NOTE — Telephone Encounter (Signed)
Called received from radiology department with a critical report on patient's [place]. 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   Called patient and her daughter [name] and information explained to them. They were advised per radiologist patient need to go to [facility] er and be further evaluated may need a CT and possible surgery. They understand and will proceed to emergency room.

## 2018-04-10 ENCOUNTER — Encounter (HOSPITAL_COMMUNITY): Payer: Self-pay | Admitting: *Deleted

## 2018-04-10 ENCOUNTER — Other Ambulatory Visit: Payer: Self-pay

## 2018-04-10 ENCOUNTER — Inpatient Hospital Stay (HOSPITAL_COMMUNITY): Payer: Medicare Other | Admitting: Certified Registered"

## 2018-04-10 ENCOUNTER — Encounter (HOSPITAL_COMMUNITY)
Admission: EM | Disposition: A | Discharge status: Home Health | Payer: Self-pay | Source: Home / Self Care | Attending: Family Medicine

## 2018-04-10 DIAGNOSIS — I724 Aneurysm of artery of lower extremity: Secondary | ICD-10-CM

## 2018-04-10 DIAGNOSIS — I10 Essential (primary) hypertension: Secondary | ICD-10-CM

## 2018-04-10 DIAGNOSIS — E871 Hypo-osmolality and hyponatremia: Secondary | ICD-10-CM

## 2018-04-10 DIAGNOSIS — I70201 Unspecified atherosclerosis of native arteries of extremities, right leg: Secondary | ICD-10-CM

## 2018-04-10 HISTORY — PX: FEMORAL-POPLITEAL BYPASS GRAFT: SHX937

## 2018-04-10 HISTORY — PX: PATCH ANGIOPLASTY: SHX6230

## 2018-04-10 LAB — BASIC METABOLIC PANEL
Anion gap: 9 (ref 5–15)
BUN: 19 mg/dL (ref 8–23)
CHLORIDE: 100 mmol/L (ref 98–111)
CO2: 24 mmol/L (ref 22–32)
Calcium: 8.9 mg/dL (ref 8.9–10.3)
Creatinine, Ser: 1.11 mg/dL — ABNORMAL HIGH (ref 0.44–1.00)
GFR calc Af Amer: 52 mL/min — ABNORMAL LOW (ref >60)
GFR calc non Af Amer: 45 mL/min — ABNORMAL LOW (ref >60)
Glucose, Bld: 84 mg/dL (ref 70–99)
POTASSIUM: 4.9 mmol/L (ref 3.5–5.1)
Sodium: 133 mmol/L — ABNORMAL LOW (ref 135–145)

## 2018-04-10 LAB — CBC
HCT: 24.1 % — ABNORMAL LOW (ref 36.0–46.0)
Hemoglobin: 7.6 g/dL — ABNORMAL LOW (ref 12.0–15.0)
MCH: 28.9 pg (ref 26.0–34.0)
MCHC: 31.5 g/dL (ref 30.0–36.0)
MCV: 91.6 fL (ref 80.0–100.0)
Platelets: 153 10*3/uL (ref 150–400)
RBC: 2.63 MIL/uL — ABNORMAL LOW (ref 3.87–5.11)
RDW: 15.8 % — ABNORMAL HIGH (ref 11.5–15.5)
WBC: 4.8 10*3/uL (ref 4.0–10.5)
nRBC: 0 % (ref 0.0–0.2)

## 2018-04-10 LAB — PREPARE RBC (CROSSMATCH)

## 2018-04-10 LAB — MRSA PCR SCREENING: MRSA by PCR: NEGATIVE

## 2018-04-10 SURGERY — BYPASS GRAFT FEMORAL-POPLITEAL ARTERY
Anesthesia: General | Laterality: Right

## 2018-04-10 MED ORDER — ONDANSETRON HCL 4 MG/2ML IJ SOLN
4.0000 mg | Freq: Four times a day (QID) | INTRAMUSCULAR | Status: DC | PRN
  Administered 2018-04-10 – 2018-04-15 (×4): 4 mg via INTRAVENOUS
  Filled 2018-04-10 (×3): qty 2

## 2018-04-10 MED ORDER — SODIUM CHLORIDE 0.9 % IV SOLN: 500.0000 mL | Freq: Once | INTRAVENOUS | Status: DC | PRN

## 2018-04-10 MED ORDER — METOPROLOL TARTRATE 5 MG/5ML IV SOLN: 2.0000 mg | INTRAVENOUS | Status: DC | PRN

## 2018-04-10 MED ORDER — PROTAMINE SULFATE 10 MG/ML IV SOLN
INTRAVENOUS | Status: AC
  Filled 2018-04-10: qty 5

## 2018-04-10 MED ORDER — SODIUM CHLORIDE 0.9 % IV SOLN
INTRAVENOUS | Status: DC | PRN
  Administered 2018-04-10: 30 ug/min via INTRAVENOUS

## 2018-04-10 MED ORDER — BISACODYL 5 MG PO TBEC
5.0000 mg | DELAYED_RELEASE_TABLET | Freq: Every day | ORAL | Status: DC | PRN
  Administered 2018-04-12: 5 mg via ORAL
  Filled 2018-04-10: qty 1

## 2018-04-10 MED ORDER — ACETAMINOPHEN 10 MG/ML IV SOLN: 1000.0000 mg | Freq: Once | INTRAVENOUS | Status: DC | PRN

## 2018-04-10 MED ORDER — FENTANYL CITRATE (PF) 100 MCG/2ML IJ SOLN
INTRAMUSCULAR | Status: AC
  Administered 2018-04-10: 25 ug via INTRAVENOUS
  Filled 2018-04-10: qty 2

## 2018-04-10 MED ORDER — SODIUM CHLORIDE 0.9 % IV SOLN
INTRAVENOUS | Status: AC
  Administered 2018-04-10: 16:00:00 via INTRAVENOUS

## 2018-04-10 MED ORDER — ALUM & MAG HYDROXIDE-SIMETH 200-200-20 MG/5ML PO SUSP
15.0000 mL | ORAL | Status: DC | PRN
  Administered 2018-04-13: 30 mL via ORAL
  Filled 2018-04-10: qty 30

## 2018-04-10 MED ORDER — ROCURONIUM BROMIDE 50 MG/5ML IV SOSY
PREFILLED_SYRINGE | INTRAVENOUS | Status: AC
  Filled 2018-04-10: qty 5

## 2018-04-10 MED ORDER — SODIUM CHLORIDE 0.9 % IV SOLN
INTRAVENOUS | Status: DC | PRN
  Administered 2018-04-10: 500 mL

## 2018-04-10 MED ORDER — OXYCODONE HCL 5 MG/5ML PO SOLN: 5.0000 mg | Freq: Once | ORAL | Status: DC | PRN

## 2018-04-10 MED ORDER — OXYCODONE HCL 5 MG PO TABS
5.0000 mg | ORAL_TABLET | ORAL | Status: DC | PRN
  Administered 2018-04-14 – 2018-04-16 (×2): 10 mg via ORAL
  Filled 2018-04-10 (×2): qty 2

## 2018-04-10 MED ORDER — PROPOFOL 10 MG/ML IV BOLUS
INTRAVENOUS | Status: DC | PRN
  Administered 2018-04-10: 60 mg via INTRAVENOUS
  Administered 2018-04-10: 20 mg via INTRAVENOUS

## 2018-04-10 MED ORDER — GUAIFENESIN-DM 100-10 MG/5ML PO SYRP: 15.0000 mL | ORAL_SOLUTION | ORAL | Status: DC | PRN

## 2018-04-10 MED ORDER — HEPARIN SODIUM (PORCINE) 1000 UNIT/ML IJ SOLN
INTRAMUSCULAR | Status: AC
  Filled 2018-04-10: qty 1

## 2018-04-10 MED ORDER — ONDANSETRON HCL 4 MG/2ML IJ SOLN
INTRAMUSCULAR | Status: AC
  Administered 2018-04-10: 4 mg
  Filled 2018-04-10: qty 2

## 2018-04-10 MED ORDER — PROPOFOL 10 MG/ML IV BOLUS
INTRAVENOUS | Status: AC
  Filled 2018-04-10: qty 20

## 2018-04-10 MED ORDER — PROTAMINE SULFATE 10 MG/ML IV SOLN
INTRAVENOUS | Status: DC | PRN
  Administered 2018-04-10: 20 mg via INTRAVENOUS
  Administered 2018-04-10: 40 mg via INTRAVENOUS

## 2018-04-10 MED ORDER — HYDROMORPHONE HCL 1 MG/ML IJ SOLN
0.5000 mg | INTRAMUSCULAR | Status: DC | PRN
  Administered 2018-04-10: 1 mg via INTRAVENOUS
  Filled 2018-04-10: qty 1

## 2018-04-10 MED ORDER — DEXAMETHASONE SODIUM PHOSPHATE 10 MG/ML IJ SOLN
INTRAMUSCULAR | Status: DC | PRN
  Administered 2018-04-10: 4 mg via INTRAVENOUS

## 2018-04-10 MED ORDER — ENOXAPARIN SODIUM 30 MG/0.3ML ~~LOC~~ SOLN
30.0000 mg | SUBCUTANEOUS | Status: AC
  Administered 2018-04-11 – 2018-04-13 (×3): 30 mg via SUBCUTANEOUS
  Filled 2018-04-10 (×3): qty 0.3

## 2018-04-10 MED ORDER — ONDANSETRON HCL 4 MG/2ML IJ SOLN
INTRAMUSCULAR | Status: AC
  Filled 2018-04-10: qty 2

## 2018-04-10 MED ORDER — SUGAMMADEX SODIUM 200 MG/2ML IV SOLN
INTRAVENOUS | Status: DC | PRN
  Administered 2018-04-10: 120 mg via INTRAVENOUS

## 2018-04-10 MED ORDER — THROMBIN 20000 UNITS EX SOLR
CUTANEOUS | Status: DC | PRN
  Administered 2018-04-10: 11:00:00 via TOPICAL

## 2018-04-10 MED ORDER — FENTANYL CITRATE (PF) 100 MCG/2ML IJ SOLN
INTRAMUSCULAR | Status: DC | PRN
  Administered 2018-04-10: 25 ug via INTRAVENOUS
  Administered 2018-04-10: 100 ug via INTRAVENOUS
  Administered 2018-04-10: 25 ug via INTRAVENOUS

## 2018-04-10 MED ORDER — LACTATED RINGERS IV SOLN
INTRAVENOUS | Status: DC | PRN
  Administered 2018-04-10 (×2): via INTRAVENOUS

## 2018-04-10 MED ORDER — ACETAMINOPHEN 500 MG PO TABS: 1000.0000 mg | ORAL_TABLET | Freq: Once | ORAL | Status: DC | PRN

## 2018-04-10 MED ORDER — ONDANSETRON HCL 4 MG/2ML IJ SOLN
INTRAMUSCULAR | Status: DC | PRN
  Administered 2018-04-10: 4 mg via INTRAVENOUS

## 2018-04-10 MED ORDER — OXYCODONE HCL 5 MG PO TABS: 5.0000 mg | ORAL_TABLET | Freq: Once | ORAL | Status: DC | PRN

## 2018-04-10 MED ORDER — LIDOCAINE 2% (20 MG/ML) 5 ML SYRINGE
INTRAMUSCULAR | Status: AC
  Filled 2018-04-10: qty 5

## 2018-04-10 MED ORDER — MAGNESIUM SULFATE 2 GM/50ML IV SOLN: 2.0000 g | Freq: Every day | INTRAVENOUS | Status: DC | PRN

## 2018-04-10 MED ORDER — LIDOCAINE 2% (20 MG/ML) 5 ML SYRINGE
INTRAMUSCULAR | Status: DC | PRN
  Administered 2018-04-10: 40 mg via INTRAVENOUS

## 2018-04-10 MED ORDER — FENTANYL CITRATE (PF) 250 MCG/5ML IJ SOLN
INTRAMUSCULAR | Status: AC
  Filled 2018-04-10: qty 5

## 2018-04-10 MED ORDER — ONDANSETRON HCL 4 MG/2ML IJ SOLN
4.0000 mg | Freq: Four times a day (QID) | INTRAMUSCULAR | Status: DC | PRN
  Filled 2018-04-10: qty 2

## 2018-04-10 MED ORDER — FENTANYL CITRATE (PF) 100 MCG/2ML IJ SOLN
25.0000 ug | INTRAMUSCULAR | Status: DC | PRN
  Administered 2018-04-10: 25 ug via INTRAVENOUS

## 2018-04-10 MED ORDER — METOCLOPRAMIDE HCL 5 MG/ML IJ SOLN
10.0000 mg | Freq: Once | INTRAMUSCULAR | Status: AC | PRN
  Administered 2018-04-10: 10 mg via INTRAVENOUS
  Filled 2018-04-10: qty 2

## 2018-04-10 MED ORDER — SODIUM CHLORIDE 0.9 % IV SOLN
10.0000 mL/h | Freq: Once | INTRAVENOUS | Status: AC
  Administered 2018-04-10: 09:00:00 via INTRAVENOUS

## 2018-04-10 MED ORDER — SODIUM CHLORIDE 0.9 % IV SOLN
INTRAVENOUS | Status: AC
  Filled 2018-04-10: qty 1.2

## 2018-04-10 MED ORDER — PAPAVERINE HCL 30 MG/ML IJ SOLN
INTRAMUSCULAR | Status: AC
  Filled 2018-04-10: qty 2

## 2018-04-10 MED ORDER — 0.9 % SODIUM CHLORIDE (POUR BTL) OPTIME
TOPICAL | Status: DC | PRN
  Administered 2018-04-10: 2000 mL

## 2018-04-10 MED ORDER — DEXAMETHASONE SODIUM PHOSPHATE 10 MG/ML IJ SOLN
INTRAMUSCULAR | Status: AC
  Filled 2018-04-10: qty 1

## 2018-04-10 MED ORDER — LABETALOL HCL 5 MG/ML IV SOLN: 10.0000 mg | INTRAVENOUS | Status: DC | PRN

## 2018-04-10 MED ORDER — HEPARIN SODIUM (PORCINE) 1000 UNIT/ML IJ SOLN
INTRAMUSCULAR | Status: DC | PRN
  Administered 2018-04-10: 2000 [IU] via INTRAVENOUS
  Administered 2018-04-10: 5000 [IU] via INTRAVENOUS

## 2018-04-10 MED ORDER — PHENOL 1.4 % MT LIQD: 1.0000 | OROMUCOSAL | Status: DC | PRN

## 2018-04-10 MED ORDER — SENNOSIDES-DOCUSATE SODIUM 8.6-50 MG PO TABS: 1.0000 | ORAL_TABLET | Freq: Every evening | ORAL | Status: DC | PRN

## 2018-04-10 MED ORDER — ACETAMINOPHEN 160 MG/5ML PO SOLN: 1000.0000 mg | Freq: Once | ORAL | Status: DC | PRN

## 2018-04-10 MED ORDER — ROCURONIUM BROMIDE 10 MG/ML (PF) SYRINGE
PREFILLED_SYRINGE | INTRAVENOUS | Status: DC | PRN
  Administered 2018-04-10: 40 mg via INTRAVENOUS
  Administered 2018-04-10: 10 mg via INTRAVENOUS
  Administered 2018-04-10 (×2): 20 mg via INTRAVENOUS
  Administered 2018-04-10: 10 mg via INTRAVENOUS

## 2018-04-10 MED ORDER — POTASSIUM CHLORIDE CRYS ER 20 MEQ PO TBCR: 20.0000 meq | EXTENDED_RELEASE_TABLET | Freq: Every day | ORAL | Status: DC | PRN

## 2018-04-10 SURGICAL SUPPLY — 65 items
ADH SKN CLS APL DERMABOND .7 (GAUZE/BANDAGES/DRESSINGS) ×3
BANDAGE ELASTIC 4 VELCRO ST LF (GAUZE/BANDAGES/DRESSINGS) ×1 IMPLANT
BANDAGE ESMARK 6X9 LF (GAUZE/BANDAGES/DRESSINGS) IMPLANT
BLADE 15 SAFETY STRL DISP (BLADE) ×1 IMPLANT
BNDG CMPR 9X6 STRL LF SNTH (GAUZE/BANDAGES/DRESSINGS)
BNDG ESMARK 6X9 LF (GAUZE/BANDAGES/DRESSINGS)
BNDG GAUZE ELAST 4 BULKY (GAUZE/BANDAGES/DRESSINGS) ×1 IMPLANT
CANISTER SUCT 3000ML PPV (MISCELLANEOUS) ×2 IMPLANT
CANNULA VESSEL 3MM 2 BLNT TIP (CANNULA) ×4 IMPLANT
CATH EMB 3FR 80CM (CATHETERS) ×1 IMPLANT
CATH EMB 4FR 80CM (CATHETERS) ×1 IMPLANT
CLIP VESOCCLUDE MED 24/CT (CLIP) ×2 IMPLANT
CLIP VESOCCLUDE SM WIDE 24/CT (CLIP) ×2 IMPLANT
COVER PROBE W GEL 5X96 (DRAPES) IMPLANT
COVER WAND RF STERILE (DRAPES) ×2 IMPLANT
CUFF TOURNIQUET SINGLE 24IN (TOURNIQUET CUFF) IMPLANT
CUFF TOURNIQUET SINGLE 34IN LL (TOURNIQUET CUFF) IMPLANT
CUFF TOURNIQUET SINGLE 44IN (TOURNIQUET CUFF) IMPLANT
DERMABOND ADVANCED (GAUZE/BANDAGES/DRESSINGS) ×3
DERMABOND ADVANCED .7 DNX12 (GAUZE/BANDAGES/DRESSINGS) ×1 IMPLANT
DRAIN CHANNEL 15F RND FF W/TCR (WOUND CARE) ×1 IMPLANT
DRAPE HALF SHEET 40X57 (DRAPES) IMPLANT
DRAPE X-RAY CASS 24X20 (DRAPES) IMPLANT
ELECT REM PT RETURN 9FT ADLT (ELECTROSURGICAL) ×2
ELECTRODE REM PT RTRN 9FT ADLT (ELECTROSURGICAL) ×1 IMPLANT
EVACUATOR SILICONE 100CC (DRAIN) ×1 IMPLANT
GAUZE SPONGE 4X4 12PLY STRL LF (GAUZE/BANDAGES/DRESSINGS) ×2 IMPLANT
GAUZE SPONGE 4X4 16PLY XRAY LF (GAUZE/BANDAGES/DRESSINGS) ×1 IMPLANT
GLOVE BIO SURGEON STRL SZ7.5 (GLOVE) ×2 IMPLANT
GLOVE BIOGEL PI IND STRL 8 (GLOVE) ×1 IMPLANT
GLOVE BIOGEL PI INDICATOR 8 (GLOVE) ×1
GOWN STRL REUS W/ TWL LRG LVL3 (GOWN DISPOSABLE) ×3 IMPLANT
GOWN STRL REUS W/TWL LRG LVL3 (GOWN DISPOSABLE) ×8
GRAFT VASC PATCH XENOSURE 1X14 (Vascular Products) ×1 IMPLANT
KIT BASIN OR (CUSTOM PROCEDURE TRAY) ×2 IMPLANT
KIT TURNOVER KIT B (KITS) ×2 IMPLANT
MARKER GRAFT CORONARY BYPASS (MISCELLANEOUS) IMPLANT
NS IRRIG 1000ML POUR BTL (IV SOLUTION) ×4 IMPLANT
PACK PERIPHERAL VASCULAR (CUSTOM PROCEDURE TRAY) ×2 IMPLANT
PAD ARMBOARD 7.5X6 YLW CONV (MISCELLANEOUS) ×4 IMPLANT
SET COLLECT BLD 21X3/4 12 (NEEDLE) IMPLANT
SPONGE SURGIFOAM ABS GEL 100 (HEMOSTASIS) IMPLANT
STOPCOCK 4 WAY LG BORE MALE ST (IV SETS) IMPLANT
SUT ETHILON 3 0 PS 1 (SUTURE) ×1 IMPLANT
SUT PDS AB 1 CTX 36 (SUTURE) ×1 IMPLANT
SUT PROLENE 5 0 C 1 24 (SUTURE) ×4 IMPLANT
SUT PROLENE 6 0 BV (SUTURE) ×12 IMPLANT
SUT SILK 2 0 SH (SUTURE) ×1 IMPLANT
SUT SILK 3 0 (SUTURE)
SUT SILK 3-0 18XBRD TIE 12 (SUTURE) IMPLANT
SUT VIC AB 2-0 CT1 27 (SUTURE) ×4
SUT VIC AB 2-0 CT1 TAPERPNT 27 (SUTURE) IMPLANT
SUT VIC AB 2-0 CTB1 (SUTURE) ×2 IMPLANT
SUT VIC AB 3-0 SH 27 (SUTURE) ×6
SUT VIC AB 3-0 SH 27X BRD (SUTURE) ×2 IMPLANT
SUT VIC AB 4-0 PS2 18 (SUTURE) ×3 IMPLANT
SUT VICRYL 4-0 PS2 18IN ABS (SUTURE) ×2 IMPLANT
SYR 3ML LL SCALE MARK (SYRINGE) ×1 IMPLANT
SYR TB 1ML LUER SLIP (SYRINGE) ×1 IMPLANT
TAPE CLOTH SURG 4X10 WHT LF (GAUZE/BANDAGES/DRESSINGS) ×1 IMPLANT
TOWEL GREEN STERILE (TOWEL DISPOSABLE) ×2 IMPLANT
TRAY FOLEY MTR SLVR 16FR STAT (SET/KITS/TRAYS/PACK) ×2 IMPLANT
TUBING EXTENTION W/L.L. (IV SETS) IMPLANT
UNDERPAD 30X30 (UNDERPADS AND DIAPERS) ×1 IMPLANT
WATER STERILE IRR 1000ML POUR (IV SOLUTION) ×2 IMPLANT

## 2018-04-10 NOTE — Interval H&P Note (Signed)
History and Physical Interval Note:  04/10/2018 8:02 AM  [name]  has presented today for surgery, with the diagnosis of pseudoaneurysm right femoral artery  The various methods of treatment have been discussed with the patient and family. After consideration of risks, benefits and other options for treatment, the patient has consented to  Procedure(s): REPAIR RIGHT FEMORAL ARTERY PSEUDOANEURYSM, POSSIBLE RETROPERITONEAL EXPOSURE, RIGHT POPLITEAL EMBOLECTOMY (Right) as a surgical intervention .  The patient's history has been reviewed, patient examined, no change in status, stable for surgery.  I have reviewed the patient's chart and labs.  Questions were answered to the patient's satisfaction.     [name]

## 2018-04-10 NOTE — Plan of Care (Signed)
  Problem: Clinical Measurements: Goal: Respiratory complications will improve Outcome: Progressing Goal: Cardiovascular complication will be avoided Outcome: Progressing   Problem: Coping: Goal: Level of anxiety will decrease Outcome: Progressing   Problem: Pain Managment: Goal: General experience of comfort will improve Outcome: Progressing   

## 2018-04-10 NOTE — Progress Notes (Signed)
Patient arrived from Ed with DX of right femoral pseudoaneurysm. Patient alert and oriented to room and surroundings. Vitals stable. CCMD called and telemetry verified.

## 2018-04-10 NOTE — Anesthesia Procedure Notes (Signed)
Procedure Name: Intubation Performed by: Milford Cage, CRNA Pre-anesthesia Checklist: Patient identified, Emergency Drugs available, Suction available and Patient being monitored Patient Re-evaluated:Patient Re-evaluated prior to induction Oxygen Delivery Method: Circle System Utilized Preoxygenation: Pre-oxygenation with 100% oxygen Induction Type: IV induction Ventilation: Mask ventilation without difficulty and Oral airway inserted - appropriate to patient size Laryngoscope Size: Miller and 2 Grade View: Grade I Tube type: Oral Tube size: 6.5 mm Number of attempts: 1 Airway Equipment and Method: Stylet and Oral airway Placement Confirmation: ETT inserted through vocal cords under direct vision,  positive ETCO2 and breath sounds checked- equal and bilateral Secured at: 22 cm Tube secured with: Tape Dental Injury: Teeth and Oropharynx as per pre-operative assessment

## 2018-04-10 NOTE — Progress Notes (Signed)
[facility]  PROGRESS NOTE  [name] MBE:[id number] DOB: Jul 28, 1930 DOA: 04/09/2018 PCP: Darreld Mclean, MD   Brief HPI:   85 year old female with history of arthritis, GERD, hypothyroidism, LBBB who underwent right hip replacement 4 years ago, had revision of hip in October 2019 subsequently fell in November 2019 and had dislocation of the hip.  At that time attempts were made to relocate the hip twice until she subsequently underwent revision of right hip on 01/24/2018.  Since that time patient has swelling and pain in right groin.  She was seen by PCP at that time ultrasound showed large pseudoaneurysm.  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  Also revealed occlusion of the right popliteal artery.    Subjective   Patient seen in the PACU.  Still somnolent after surgery.   Assessment/Plan:     1. Large right femoral pseudoaneurysm-seen on CTA of right lower extremity.  Status post repair of pseudoaneurysm per vascular surgery  2. RightPopliteal artery occlusion-CT with evidence of right popliteal artery occlusive emboli.  Anticoagulation as per vascular surgery recommendation.  3. Symptomatic anemia-hemoglobin 8.0 patient reports having dark stool as outpatient.  FOBT was positive in January.  Will consult GI once patient is more stable.  Follow CBC in a.m.  4. Hyponatremia-mild, sodium was 132 on admission.  Today sodium is 133.  5. Hypothyroidism-continue Synthroid  6. Hypertension-continue metoprolol, PRN hydralazine     Labs for a.m : CBC, BMP  CBG: No results for input(s): GLUCAP in the last 168 hours.  CBC: Recent Labs  Lab 04/09/18 1122 04/09/18 1427 04/10/18 0301  WBC 5.0 4.7 4.8  NEUTROABS  --  3.2  --   HGB 8.8 Repeated and verified X2.* 8.0* 7.6*  HCT 26.4* 26.8* 24.1*  MCV 89.8 95.4 91.6  PLT 153.0 152 007    Basic Metabolic Panel: Recent Labs  Lab  04/09/18 1122 04/09/18 1427 04/10/18 0301  NA 132* 132* 133*  K 4.7 4.7 4.9  CL 97 99 100  CO2 29 25 24   GLUCOSE 82 90 84  BUN 26* 26* 19  CREATININE 1.19 1.10* 1.11*  CALCIUM 8.9 8.7* 8.9     DVT prophylaxis: As per vascular surgery  Code Status: Full code  Family Communication: No family at bedside  Disposition Plan: To be decided     Consultants:  Vascular surgery  Procedures:  Repair right femoral artery pseudoaneurysm   Antibiotics:   Anti-infectives (From admission, onward)   Start     Dose/Rate Route Frequency Ordered Stop   04/10/18 0845  [MAR Hold]  ceFAZolin (ANCEF) IVPB 2g/100 mL premix     (MAR Hold since Sat 04/10/2018 at 0747. Reason: Transfer to a Procedural area.)  Note to Pharmacy:  Send with pt to OR   2 g 200 mL/hr over 30 Minutes Intravenous On call 04/09/18 1843 04/10/18 0900       Objective   Vitals:   04/10/18 1226 04/10/18 1238 04/10/18 1253 04/10/18 1308  BP:  (!) 103/56 (!) 100/53 (!) 110/49  Pulse:  65 (!) 59 (!) 51  Resp:  12 16 14   Temp: 97.7 F (36.5 C)     TempSrc:      SpO2:  97% 96% 99%  Weight:      Height:        Intake/Output Summary (Last 24 hours) at 04/10/2018 1310 Last data filed at 04/10/2018 1212  Gross per 24 hour  Intake 2230 ml  Output 1925 ml  Net 305 ml   Filed Weights   04/09/18 2348  Weight: 50.8 kg     Physical Examination:    General: Appears somnolent but arousable  Cardiovascular: S1-S2, regular, no murmur auscultated  Respiratory: Clear to auscultation bilaterally  Abdomen: Abdomen is soft, nontender, no organomegaly  Extremities: No edema of the lower extremities  Neurologic: Somnolent but arousable, moving all extremities     Data Reviewed: I have personally reviewed following labs and imaging studies   Recent Results (from the past 240 hour(s))  MRSA PCR Screening     Status: None   Collection Time: 04/09/18 11:46 PM  Result Value Ref Range Status   MRSA by PCR  NEGATIVE NEGATIVE Final    Comment:        The GeneXpert MRSA Assay (FDA approved for NASAL specimens only), is one component of a comprehensive MRSA colonization surveillance program. It is not intended to diagnose MRSA infection nor to guide or monitor treatment for MRSA infections. Performed at [facility], [place] [address] [place], [place] [address]      Liver Function Tests: No results for input(s): AST, ALT, ALKPHOS, BILITOT, PROT, ALBUMIN in the last 168 hours. No results for input(s): LIPASE, AMYLASE in the last 168 hours. No results for input(s): AMMONIA in the last 168 hours.  Cardiac Enzymes: No results for input(s): CKTOTAL, CKMB, CKMBINDEX, TROPONINI in the last 168 hours. BNP (last 3 results) No results for input(s): BNP in the last 8760 hours.  ProBNP (last 3 results) Recent Labs    02/18/18 1422 04/09/18 1122  PROBNP 192.0* 333.0*      Studies: Ct Angio Low Extrem Right W &/or Wo Contrast  Result Date: 04/09/2018 CLINICAL DATA:  History of right hip dislocation with subsequent reduction 01/24/2018 now with indeterminate vascular structure within the right groin worrisome for pseudoaneurysm. EXAM: CT ANGIOGRAPHY OF THE RIGHT LOWEREXTREMITY TECHNIQUE: Multidetector CT imaging of the right lower was performed using the standard protocol during bolus administration of intravenous contrast. Multiplanar CT image reconstructions and MIPs were obtained to evaluate the vascular anatomy. CONTRAST:  15mL ISOVUE-370 IOPAMIDOL (ISOVUE-370) INJECTION 76% COMPARISON:  Right lower extremity venous Doppler ultrasound-earlier same day FINDINGS: Vascular Findings: Abdominal aorta: Minimal amount within a tortuous but of calcified atherosclerotic plaque normal caliber abdominal aorta. IMA: Appears patent. -------------------------------------------------------------------------------- RIGHT LOWER EXTREMITY VASCULATURE: Right-sided pelvic vasculature: The right common,  external and internal iliac arteries are of normal caliber and widely patent without a hemodynamically significant narrowing. Right common femoral artery: Widely patent. There is mass effect upon the right common femoral artery secondary to a large (approximately 6.5 x 5.4 x 3.0 cm) pseudoaneurysm adjacent to the lateral distal aspect right common femoral artery (coronal image 53, series 6; axial image 67, series 4). The neck of the pseudoaneurysm measures approximately 0.3 x 0.3 cm (coronal image 49, series 6; axial image 69, series 4). There is suspect surrounding non-opacified hematoma though this is suboptimally evaluated secondary to streak artifact from the patient's right total hip prostheses. Right deep femoral artery: Widely patent without hemodynamically significant narrowing. Right superficial femoral artery: Widely patent without hemodynamically significant narrowing. Right popliteal artery: The right above is normal caliber and widely patent without hemodynamically significant narrowing. There is abrupt occlusion of the distal aspect of the right below-knee popliteal artery (image 214, series 4). Right lower leg: As above, there is abrupt occlusion of the distal aspect the right  below-knee popliteal artery. There is reconstitution involving the proximal aspect of the right anterior tibial artery (image 222, series 4) and tibioperoneal trunk (image 226, series 4) via infra geniculate collaterals. The peroneal artery becomes atretic at the level of the ankle mortise. There is short-segment occlusion of the right posterior tibial artery at the level of the hindfoot (image 329, series 4), with early reconstitution. The right anterior tibial artery gives rise to dorsalis pedis artery which appears patent to the level of the forefoot. Review of the MIP images confirms the above findings. -------------------------------------------------------------------------------- Nonvascular Findings: Diffuse anasarca  about left leg and thigh. Post right total hip replacement without evidence of hardware failure or loosening. 2 of the anchoring acetabular screws course through the medial wall the right hemipelvis and terminate within the right iliacus and iliopsoas musculature. Normal appearance of the right kidney. Imaged loops of bowel appear normal. Normal appearance of the urinary bladder. No free fluid in the pelvic cul-de-sac. IMPRESSION: 1. 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 The neck of the aneurysm measures approximately 3 mm in diameter. 2. Short-segment occlusive emboli within the distal aspect of the right popliteal artery extending to involve the proximal aspects of the right anterior tibial artery and the tibioperoneal trunk. 3. Short-segment occlusive embolism within the right popliteal artery at the level of the hindfoot. Critical Value/emergent results were called by telephone at the time of interpretation on 04/09/2018 at 4:39 pm to Dr. Gustavus Messing, who verbally acknowledged these results. Electronically Signed   By: Sandi Mariscal M.D.   On: 04/09/2018 16:48   US Venous Img Lower Unilateral Right  Result Date: 04/09/2018 CLINICAL DATA:  Right lower extremity pain and edema since undergoing hip surgery in December (dislocation and subsequent reduction of right total hip prosthesis). History of lung cancer. Evaluate for DVT. EXAM: RIGHT LOWER EXTREMITY VENOUS DOPPLER ULTRASOUND TECHNIQUE: Gray-scale sonography with graded compression, as well as color Doppler and duplex ultrasound were performed to evaluate the lower extremity deep venous systems from the level of the common femoral vein and including the common femoral, femoral, profunda femoral, popliteal and calf veins including the posterior tibial, peroneal and gastrocnemius veins when visible. The superficial great saphenous vein was also interrogated. Spectral Doppler was utilized to evaluate flow  at rest and with distal augmentation maneuvers in the common femoral, femoral and popliteal veins. COMPARISON:  Right hip radiographs-01/24/2018 (multiple examinations). FINDINGS: Contralateral Common Femoral Vein: Respiratory phasicity is normal and symmetric with the symptomatic side. No evidence of thrombus. Normal compressibility. Common Femoral Vein: No evidence of thrombus. Normal compressibility, respiratory phasicity and response to augmentation. Saphenofemoral Junction: No evidence of thrombus. Normal compressibility and flow on color Doppler imaging. Profunda Femoral Vein: No evidence of thrombus. Normal compressibility and flow on color Doppler imaging. Femoral Vein: No evidence of thrombus. Normal compressibility, respiratory phasicity and response to augmentation. Popliteal Vein: No evidence of thrombus. Normal compressibility, respiratory phasicity and response to augmentation. Calf Veins: No evidence of thrombus. Normal compressibility and flow on color Doppler imaging. Superficial Great Saphenous Vein: No evidence of thrombus. Normal compressibility. Venous Reflux:  None. Other Findings: There is a large (at least 9.9 x 7.1 x 3.2 cm) fluid collection within the right groin which contains internal pulsatile blood flow (image 12) worrisome for either a pseudoaneurysm (favored) or AV fistula. Note is made of an approximately 3.3 x 0.7 x 1.7 cm anechoic fluid collection with the right popliteal fossa compatible with a Baker's  cyst. IMPRESSION: 1. No evidence of DVT within the right lower extremity. 2. Large (at least 9.9 cm) complex fluid collection within the right groin which contains internal blood flow worrisome for either a pseudoaneurysm (favored) or AV fistula, potentially the sequela of recent left total hip dislocation and subsequent reduction. Further evaluation with CTA run-off could be performed as clinically indicated. Critical Value/emergent results were called by telephone at the time of  interpretation on 04/09/2018 at 11:34 am to Dr. Evern Core, who verbally acknowledged these results. Electronically Signed   By: Sandi Mariscal M.D.   On: 04/09/2018 12:01    Scheduled Meds: . [MAR Hold] sodium chloride   Intravenous Once  . [MAR Hold] sodium chloride   Intravenous Once  . [MAR Hold] calcium-vitamin D  1 tablet Oral Q breakfast  . [MAR Hold] docusate sodium  100 mg Oral BID  . [MAR Hold] gabapentin  300 mg Oral BID WC  . [MAR Hold] gabapentin  600 mg Oral QHS  . [MAR Hold] levothyroxine  88 mcg Oral QHS  . [MAR Hold] metoprolol succinate  12.5 mg Oral Daily  . [MAR Hold] multivitamin with minerals  1 tablet Oral Daily  . [MAR Hold] pantoprazole  40 mg Oral Daily    Admission status:Inpatient: Based on patients clinical presentation and evaluation of above clinical data, I have made determination that patient meets Inpatient criteria at this time.  Time spent: 25 min  [facility] Pager [phone]. If 7PM-7AM, please contact night-coverage at [email or web address], Office  [phone]  password [email or web address]  04/10/2018, 1:10 PM  LOS: 1 day

## 2018-04-10 NOTE — Op Note (Signed)
NAME: [name]    MRN: [id number] DOB: 1930/07/05    DATE OF OPERATION: 04/10/2018  PREOP DIAGNOSIS:    Right femoral artery pseudoaneurysm Right popliteal artery embolus  POSTOP DIAGNOSIS:    Same  PROCEDURE:    1.  Retroperitoneal exposure of right external iliac artery 2.  Repair of right common femoral artery pseudoaneurysm 3.  Right common femoral artery endarterectomy 4.  Bovine pericardial patch angioplasty of right common femoral artery 5.  Right popliteal and tibial embolectomy 6.  Bovine pericardial patch angioplasty of right popliteal artery  SURGEON: Judeth Cornfield. Scot Dock, MD, FACS  ASSIST: Laurence Slate, PA  ANESTHESIA: General  EBL: 200 cc  INDICATIONS:    [name] is a 85 y.o. female with a complicated history.  She had undergone a revision of a right hip replacement in December 2019.  She had some swelling in her thigh which was persistent.  This became more tender recently which prompted an ultrasound which showed a large pseudoaneurysm.  CT scan showed a large pseudoaneurysm in the right groin emanating from the right common femoral artery.  In addition she had embolic disease to her right popliteal artery and proximal tibial vessels.  She presents for repair of her pseudoaneurysm and popliteal embolectomy.  FINDINGS:   The pseudoaneurysm originated from a significant defect in the posterior lateral wall of the common femoral artery.  I suspect that a small branch perhaps had been torn and that over time this gradually developed into a significant fistula and pseudoaneurysm.  TECHNIQUE:   The patient was taken to the operating room and received a general anesthetic.  The lower abdomen and the entire right lower extremity were prepped and draped in usual sterile fashion.  There was a large pulsatile mass in the right groin and therefore I felt the only safe way to get proximal control was to do a retroperitoneal exposure of the right external  iliac artery.  An oblique incision was made in the right lower quadrant.  The external oblique, internal oblique, and transversalis fascia were divided allowing entry into the retroperitoneal space.  The retroperitoneal space was bluntly dissected to allow exposure of the right external iliac artery.  This was dissected free enough that I could clamp this for proximal control.  I did not clamp at this time.  Next a longitudinal incision was made in the right groin.  I was able to expose the superficial femoral artery distally for distal control.  The dissection was continued further proximally until I could identify the deep femoral artery.  Next control was obtained of several other small branches emanating off of the common femoral artery.  The patient was then heparinized.  All of these vessels were then controlled and that I dissected out the inflamed area on the lateral posterior aspect of the common femoral artery.  Once this was dissected out it was clear there is a large defect in the posterior lateral wall the common femoral artery.  I initially repaired this with a 5-0 Prolene suture and the clamps were then released.  The artery was significantly diseased in the back and therefore I felt that this would need to be further explored.  The artery was more fully exposed and then reclamped.  I then made a longitudinal arteriotomy on the anterior wall the artery and this was extended down to above the bifurcation.  There was a large plaque present here and this had to be endarterectomized.  This was  a posterior plaque.  The defect in the posterior aspect of the common femoral artery could not be clearly identified and I repaired this with a running 5-0 Prolene suture.  Next a bovine part pericardial patch was tailored for patch angioplasty of the common femoral artery.  This was sewn with continuous 6-0 Prolene suture.  The arteries were all backbled and flushed appropriately before the anastomosis completed  and then the anastomosis was completed and flow reestablished to the right leg.  Attention was then turned to right popliteal embolectomy.  A longitudinal incision was made below the knee and the dissection carried down to the below-knee popliteal artery.  I then divided the anterior tibial vein to allow exposure of the proximal anterior tibial artery and tibial peroneal trunk which were both controlled with Vesseloops.  The patient's heparin was redosed.  The vessels were all controlled and then a longitudinal arteriotomy made in the distal popliteal artery.  There was clot present here that was fairly well organized.  Her history would suggest that this occurred about a week ago.  The clot extended into the anterior tibial artery and tibial peroneal trunk.  This was removed without difficulty.  I passed a 4 Fogarty catheter multiple times proximally and no clot was retrieved.  I then passed a 3 Fogarty catheter multiple times down the anterior tibial artery no further clot was retrieved.  I then passed the down the tibial peroneal trunk and retrieved a small amount of clot that had been visualized in the posterior tibial artery on CT scan.  Multiple passes were made until no further clot was retrieved.  A bovine pericardial patch was then sewn using continuous 6-0 Prolene suture.  Prior to completing this anastomosis the artery was backbled and flushed appropriately and the anastomosis completed.  At the completion was an excellent anterior tibial and posterior tibial signal with the Doppler.   The heparin was partially reversed with protamine.  Hemostasis was obtained in the wounds.  A 15 Blake drain was placed in the groin incision.  The groin incision was closed with a deep layer of 2-0 Vicryl, subcutaneous layer with 3-0 Vicryl and the skin closed with 4-0 Vicryl.  The retroperitoneal incision was closed with a #1 PDS for the fascial layer.  The subcutaneous layer was closed with 2 deep layers of 3-0  Vicryl and the skin closed with 4-0 Vicryl.  The below the knee incision was closed with a deep layer of 3-0 Vicryl, a subcutaneous layer with 3-0 Vicryl and the skin closed with 4-0 Vicryl.  Sterile dressing was applied.  The patient tolerated the procedure well was transferred to recovery room in stable condition.  All needle and sponge counts were correct.    [name], MD, FACS [facility]  DATE OF DICTATION:   04/10/2018

## 2018-04-10 NOTE — Progress Notes (Signed)
Nurse giving report from ED said that blood transfusion will be done in OR.

## 2018-04-10 NOTE — Transfer of Care (Signed)
Immediate Anesthesia Transfer of Care Note  Patient: [name]  Procedure(s) Performed: REPAIR RIGHT FEMORAL ARTERY PSEUDOANEURYSM, RETROPERITONEAL EXPOSURE OF ILIAC ARTERY, RIGHT POPLITEAL EMBOLECTOMY (Right ) PATCH  ANGIOPLASTY OF RIGHT FEMORAL ARTERY USING BOVINE PATCH, PATCH ANGIOPLASTY OF RIGHT POPLITEAL ARTERY USING BOVINE PATCH (Right )  Patient Location: PACU  Anesthesia Type:General  Level of Consciousness: drowsy  Airway & Oxygen Therapy: Patient Spontanous Breathing and Patient connected to face mask oxygen  Post-op Assessment: Report given to RN and Post -op Vital signs reviewed and stable  Post vital signs: Reviewed and stable  Last Vitals:  Vitals Value Taken Time  BP 114/65 04/10/2018 12:23 PM  Temp    Pulse 56 04/10/2018 12:25 PM  Resp 10 04/10/2018 12:25 PM  SpO2 98 % 04/10/2018 12:25 PM  Vitals shown include unvalidated device data.  Last Pain:  Vitals:   04/10/18 0657  TempSrc: Oral  PainSc:          Complications: No apparent anesthesia complications

## 2018-04-10 NOTE — Progress Notes (Signed)
[facility]  Subjective  - Doing well over all.   Objective (!) 110/49 (!) 51 97.7 F (36.5 C) 14 99%  Intake/Output Summary (Last 24 hours) at 04/10/2018 1700 Last data filed at 04/10/2018 1536 Gross per 24 hour  Intake 3016 ml  Output 2325 ml  Net 691 ml    Right groin with firmness at baseline lateral groin.  New incision soft with JP drain out put of 30 cc since surgery. Palpable DP pulse lower leg wrap clean and dry.  Assessment/Planning: POD 0 1.  Retroperitoneal exposure of right external iliac artery 2.  Repair of right common femoral artery pseudoaneurysm 3.  Right common femoral artery endarterectomy 4.  Bovine pericardial patch angioplasty of right common femoral artery 5.  Right popliteal and tibial embolectomy 6.  Bovine pericardial patch angioplasty of right popliteal artery  Pre op chronic anemia starting HGB 8.0 blood loss anemia EBL 1200 transfused PRBC 2 units intraoperatively and 1 unit pre-op for a total of 3 units.   Stable disposition with patent arterial flow in the right LE with palpable DP pulse.     [name] 04/10/2018 5:00 PM --  Laboratory Lab Results: Recent Labs    04/09/18 1427 04/10/18 0301  WBC 4.7 4.8  HGB 8.0* 7.6*  HCT 26.8* 24.1*  PLT 152 153   BMET Recent Labs    04/09/18 1427 04/10/18 0301  NA 132* 133*  K 4.7 4.9  CL 99 100  CO2 25 24  GLUCOSE 90 84  BUN 26* 19  CREATININE 1.10* 1.11*  CALCIUM 8.7* 8.9    COAG Lab Results  Component Value Date   INR 0.97 04/21/2014   INR 0.89 06/02/2013   INR 0.91 05/02/2013   No results found for: PTT

## 2018-04-10 NOTE — Anesthesia Preprocedure Evaluation (Addendum)
Anesthesia Evaluation  Patient identified by MRN, date of birth, ID band Patient awake    Reviewed: Allergy & Precautions, NPO status , Patient's Chart, lab work & pertinent test results  History of Anesthesia Complications (+) PONV and history of anesthetic complications  Airway Mallampati: II  TM Distance: >3 FB Neck ROM: Full    Dental  (+) Dental Advisory Given   Pulmonary    breath sounds clear to auscultation       Cardiovascular hypertension, Pt. on medications and Pt. on home beta blockers + Peripheral Vascular Disease  + dysrhythmias  Rhythm:Regular     Neuro/Psych  Neuromuscular disease    GI/Hepatic hiatal hernia, GERD  Medicated and Controlled,? GI bleed   Endo/Other  Hypothyroidism   Renal/GU Renal InsufficiencyRenal disease     Musculoskeletal  (+) Arthritis ,   Abdominal   Peds  Hematology  (+) anemia , Vitals stable, blood given by ED   Anesthesia Other Findings - Left ventricle: The cavity size was normal. Wall thickness was   normal. Systolic function was mildly to moderately reduced. The   estimated ejection fraction was in the range of 40% to 45%. Wall   motion was normal; there were no regional wall motion   abnormalities. Doppler parameters are consistent with abnormal   left ventricular relaxation (grade 1 diastolic dysfunction). - Aortic valve: There was mild regurgitation. - Mitral valve: There was mild to moderate regurgitation.  Reproductive/Obstetrics                            Anesthesia Physical Anesthesia Plan  ASA: III  Anesthesia Plan: General   Post-op Pain Management:    Induction: Intravenous  PONV Risk Score and Plan: 4 or greater and Ondansetron and Dexamethasone  Airway Management Planned: Oral ETT  Additional Equipment:   Intra-op Plan:   Post-operative Plan: Extubation in OR and Possible Post-op intubation/ventilation  Informed  Consent: I have reviewed the patients History and Physical, chart, labs and discussed the procedure including the risks, benefits and alternatives for the proposed anesthesia with the patient or authorized representative who has indicated his/her understanding and acceptance.     Dental advisory given  Plan Discussed with: CRNA and Surgeon  Anesthesia Plan Comments:         Anesthesia Quick Evaluation

## 2018-04-11 ENCOUNTER — Inpatient Hospital Stay (HOSPITAL_COMMUNITY): Payer: Medicare Other

## 2018-04-11 ENCOUNTER — Other Ambulatory Visit (HOSPITAL_COMMUNITY): Payer: Medicare Other

## 2018-04-11 DIAGNOSIS — I340 Nonrheumatic mitral (valve) insufficiency: Secondary | ICD-10-CM

## 2018-04-11 DIAGNOSIS — I361 Nonrheumatic tricuspid (valve) insufficiency: Secondary | ICD-10-CM

## 2018-04-11 LAB — CBC
HCT: 25.7 % — ABNORMAL LOW (ref 36.0–46.0)
Hemoglobin: 8.6 g/dL — ABNORMAL LOW (ref 12.0–15.0)
MCH: 29.9 pg (ref 26.0–34.0)
MCHC: 33.5 g/dL (ref 30.0–36.0)
MCV: 89.2 fL (ref 80.0–100.0)
Platelets: 154 10*3/uL (ref 150–400)
RBC: 2.88 MIL/uL — ABNORMAL LOW (ref 3.87–5.11)
RDW: 15.5 % (ref 11.5–15.5)
WBC: 8.9 10*3/uL (ref 4.0–10.5)
nRBC: 0 % (ref 0.0–0.2)

## 2018-04-11 LAB — COMPREHENSIVE METABOLIC PANEL
ALT: 11 U/L (ref 0–44)
ANION GAP: 5 (ref 5–15)
AST: 19 U/L (ref 15–41)
Albumin: 2.6 g/dL — ABNORMAL LOW (ref 3.5–5.0)
Alkaline Phosphatase: 35 U/L — ABNORMAL LOW (ref 38–126)
BUN: 21 mg/dL (ref 8–23)
CALCIUM: 8.1 mg/dL — AB (ref 8.9–10.3)
CO2: 25 mmol/L (ref 22–32)
Chloride: 105 mmol/L (ref 98–111)
Creatinine, Ser: 1.3 mg/dL — ABNORMAL HIGH (ref 0.44–1.00)
GFR calc Af Amer: 43 mL/min — ABNORMAL LOW (ref >60)
GFR calc non Af Amer: 37 mL/min — ABNORMAL LOW (ref >60)
Glucose, Bld: 125 mg/dL — ABNORMAL HIGH (ref 70–99)
Potassium: 5.3 mmol/L — ABNORMAL HIGH (ref 3.5–5.1)
Sodium: 135 mmol/L (ref 135–145)
Total Bilirubin: 0.7 mg/dL (ref 0.3–1.2)
Total Protein: 4.8 g/dL — ABNORMAL LOW (ref 6.5–8.1)

## 2018-04-11 LAB — ECHOCARDIOGRAM COMPLETE
Height: 62 in
Weight: 1792 oz

## 2018-04-11 MED ORDER — SODIUM CHLORIDE 0.9 % IV SOLN: INTRAVENOUS | Status: DC

## 2018-04-11 MED ORDER — SODIUM CHLORIDE 0.9 % IV SOLN
INTRAVENOUS | Status: AC
  Administered 2018-04-11: 17:00:00 via INTRAVENOUS

## 2018-04-11 NOTE — Progress Notes (Signed)
[facility]  PROGRESS NOTE  [name] VCB:[id number] DOB: 06-14-1930 DOA: 04/09/2018 PCP: Raven Mclean, MD   Brief HPI:   85 year old female with history of arthritis, GERD, hypothyroidism, LBBB who underwent right hip replacement 4 years ago, had revision of hip in October 2019 subsequently fell in November 2019 and had dislocation of the hip.  At that time attempts were made to relocate the hip twice until she subsequently underwent revision of right hip on 01/24/2018.  Since that time patient has swelling and pain in right groin.  She was seen by PCP at that time ultrasound showed large pseudoaneurysm.  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  Also revealed occlusion of the right popliteal artery.    Subjective   Patient seen and examined, she received 3 units PRBC yesterday.  Hemoglobin this morning is stable at 8.6   Assessment/Plan:     1. Large right femoral pseudoaneurysm-seen on CTA of right lower extremity.  Status post repair of pseudoaneurysm per vascular surgery  2. RightPopliteal artery occlusion-CT with evidence of right popliteal artery occlusive emboli.  Anticoagulation as per vascular surgery recommendation.  3. Symptomatic anemia-hemoglobin 8.6, status post 3 units PRBC.  Follow CBC in a.m.  FOBT was positive in January.  Will consult GI if hemoglobin drops significantly in the hospital. Follow CBC in a.m.  4. Hyponatremia-mild, sodium was 132 on admission.  Today sodium is 135.  5. Hypothyroidism-continue Synthroid  6. Hypertension-continue metoprolol, PRN hydralazine  7. Hyperkalemia-mild, potassium is 5.3.  Follow BMP in a.m.  8. Acute kidney injury-mild creatinine is elevated 1.30.  We will continue with normal saline at 75 mL/h for 12 more hours.  Check renal function in a.m.     Labs for a.m : CBC, BMP  CBG: No results for input(s): GLUCAP in the last  168 hours.  CBC: Recent Labs  Lab 04/09/18 1122 04/09/18 1427 04/10/18 0301 04/11/18 0020  WBC 5.0 4.7 4.8 8.9  NEUTROABS  --  3.2  --   --   HGB 8.8 Repeated and verified X2.* 8.0* 7.6* 8.6*  HCT 26.4* 26.8* 24.1* 25.7*  MCV 89.8 95.4 91.6 89.2  PLT 153.0 152 153 384    Basic Metabolic Panel: Recent Labs  Lab 04/09/18 1122 04/09/18 1427 04/10/18 0301 04/11/18 0020  NA 132* 132* 133* 135  K 4.7 4.7 4.9 5.3*  CL 97 99 100 105  CO2 29 25 24 25   GLUCOSE 82 90 84 125*  BUN 26* 26* 19 21  CREATININE 1.19 1.10* 1.11* 1.30*  CALCIUM 8.9 8.7* 8.9 8.1*     DVT prophylaxis: As per vascular surgery  Code Status: Full code  Family Communication: No family at bedside  Disposition Plan: To be decided     Consultants:  Vascular surgery  Procedures:  Repair right femoral artery pseudoaneurysm   Antibiotics:   Anti-infectives (From admission, onward)   Start     Dose/Rate Route Frequency Ordered Stop   04/10/18 0845  ceFAZolin (ANCEF) IVPB 2g/100 mL premix    Note to Pharmacy:  Send with pt to OR   2 g 200 mL/hr over 30 Minutes Intravenous On call 04/09/18 1843 04/10/18 0900       Objective   Vitals:   04/10/18 1615 04/10/18 1745 04/10/18 2026 04/11/18 0501  BP: (!) 117/52 (!) 94/46 (!) 100/51 (!) 94/38  Pulse: (!) 59 (!) 58 60 66  Resp: 12 11  12   Temp:   97.7 F (36.5 C) 98.7 F (37.1 C)  TempSrc:   Oral Oral  SpO2: 99% 98% 96% 94%  Weight:      Height:        Intake/Output Summary (Last 24 hours) at 04/11/2018 2947 Last data filed at 04/11/2018 6546 Gross per 24 hour  Intake 3975 ml  Output 2120 ml  Net 1855 ml   Filed Weights   04/09/18 2348  Weight: 50.8 kg     Physical Examination:    General: Appears somnolent but arousable  Cardiovascular: S1-S2, regular, no murmur auscultated  Respiratory: Clear to auscultation bilaterally  Abdomen: Abdomen is soft, nontender, no organomegaly  Extremities: No edema of the lower  extremities  Neurologic: Somnolent but arousable, moving all extremities     Data Reviewed: I have personally reviewed following labs and imaging studies   Recent Results (from the past 240 hour(s))  MRSA PCR Screening     Status: None   Collection Time: 04/09/18 11:46 PM  Result Value Ref Range Status   MRSA by PCR NEGATIVE NEGATIVE Final    Comment:        The GeneXpert MRSA Assay (FDA approved for NASAL specimens only), is one component of a comprehensive MRSA colonization surveillance program. It is not intended to diagnose MRSA infection nor to guide or monitor treatment for MRSA infections. Performed at [facility], [place] [address] [place], [place] [address]      Liver Function Tests: Recent Labs  Lab 04/11/18 0020  AST 19  ALT 11  ALKPHOS 35*  BILITOT 0.7  PROT 4.8*  ALBUMIN 2.6*   No results for input(s): LIPASE, AMYLASE in the last 168 hours. No results for input(s): AMMONIA in the last 168 hours.  Cardiac Enzymes: No results for input(s): CKTOTAL, CKMB, CKMBINDEX, TROPONINI in the last 168 hours. BNP (last 3 results) No results for input(s): BNP in the last 8760 hours.  ProBNP (last 3 results) Recent Labs    02/18/18 1422 04/09/18 1122  PROBNP 192.0* 333.0*      Studies: Ct Angio Low Extrem Right W &/or Wo Contrast  Result Date: 04/09/2018 CLINICAL DATA:  History of right hip dislocation with subsequent reduction 01/24/2018 now with indeterminate vascular structure within the right groin worrisome for pseudoaneurysm. EXAM: CT ANGIOGRAPHY OF THE RIGHT LOWEREXTREMITY TECHNIQUE: Multidetector CT imaging of the right lower was performed using the standard protocol during bolus administration of intravenous contrast. Multiplanar CT image reconstructions and MIPs were obtained to evaluate the vascular anatomy. CONTRAST:  167mL ISOVUE-370 IOPAMIDOL (ISOVUE-370) INJECTION 76% COMPARISON:  Right lower extremity venous Doppler ultrasound-earlier  same day FINDINGS: Vascular Findings: Abdominal aorta: Minimal amount within a tortuous but of calcified atherosclerotic plaque normal caliber abdominal aorta. IMA: Appears patent. -------------------------------------------------------------------------------- RIGHT LOWER EXTREMITY VASCULATURE: Right-sided pelvic vasculature: The right common, external and internal iliac arteries are of normal caliber and widely patent without a hemodynamically significant narrowing. Right common femoral artery: Widely patent. There is mass effect upon the right common femoral artery secondary to a large (approximately 6.5 x 5.4 x 3.0 cm) pseudoaneurysm adjacent to the lateral distal aspect right common femoral artery (coronal image 53, series 6; axial image 67, series 4). The neck of the pseudoaneurysm measures approximately 0.3 x 0.3 cm (coronal image 49, series 6; axial image 69, series 4). There is suspect surrounding non-opacified hematoma though this is suboptimally evaluated secondary to streak artifact from the patient's right total hip prostheses.  Right deep femoral artery: Widely patent without hemodynamically significant narrowing. Right superficial femoral artery: Widely patent without hemodynamically significant narrowing. Right popliteal artery: The right above is normal caliber and widely patent without hemodynamically significant narrowing. There is abrupt occlusion of the distal aspect of the right below-knee popliteal artery (image 214, series 4). Right lower leg: As above, there is abrupt occlusion of the distal aspect the right below-knee popliteal artery. There is reconstitution involving the proximal aspect of the right anterior tibial artery (image 222, series 4) and tibioperoneal trunk (image 226, series 4) via infra geniculate collaterals. The peroneal artery becomes atretic at the level of the ankle mortise. There is short-segment occlusion of the right posterior tibial artery at the level of the hindfoot  (image 329, series 4), with early reconstitution. The right anterior tibial artery gives rise to dorsalis pedis artery which appears patent to the level of the forefoot. Review of the MIP images confirms the above findings. -------------------------------------------------------------------------------- Nonvascular Findings: Diffuse anasarca about left leg and thigh. Post right total hip replacement without evidence of hardware failure or loosening. 2 of the anchoring acetabular screws course through the medial wall the right hemipelvis and terminate within the right iliacus and iliopsoas musculature. Normal appearance of the right kidney. Imaged loops of bowel appear normal. Normal appearance of the urinary bladder. No free fluid in the pelvic cul-de-sac. IMPRESSION: 1. 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 The neck of the aneurysm measures approximately 3 mm in diameter. 2. Short-segment occlusive emboli within the distal aspect of the right popliteal artery extending to involve the proximal aspects of the right anterior tibial artery and the tibioperoneal trunk. 3. Short-segment occlusive embolism within the right popliteal artery at the level of the hindfoot. Critical Value/emergent results were called by telephone at the time of interpretation on 04/09/2018 at 4:39 pm to Dr. Gustavus Messing, who verbally acknowledged these results. Electronically Signed   By: Sandi Mariscal M.D.   On: 04/09/2018 16:48   US Venous Img Lower Unilateral Right  Result Date: 04/09/2018 CLINICAL DATA:  Right lower extremity pain and edema since undergoing hip surgery in December (dislocation and subsequent reduction of right total hip prosthesis). History of lung cancer. Evaluate for DVT. EXAM: RIGHT LOWER EXTREMITY VENOUS DOPPLER ULTRASOUND TECHNIQUE: Gray-scale sonography with graded compression, as well as color Doppler and duplex ultrasound were performed to evaluate the lower  extremity deep venous systems from the level of the common femoral vein and including the common femoral, femoral, profunda femoral, popliteal and calf veins including the posterior tibial, peroneal and gastrocnemius veins when visible. The superficial great saphenous vein was also interrogated. Spectral Doppler was utilized to evaluate flow at rest and with distal augmentation maneuvers in the common femoral, femoral and popliteal veins. COMPARISON:  Right hip radiographs-01/24/2018 (multiple examinations). FINDINGS: Contralateral Common Femoral Vein: Respiratory phasicity is normal and symmetric with the symptomatic side. No evidence of thrombus. Normal compressibility. Common Femoral Vein: No evidence of thrombus. Normal compressibility, respiratory phasicity and response to augmentation. Saphenofemoral Junction: No evidence of thrombus. Normal compressibility and flow on color Doppler imaging. Profunda Femoral Vein: No evidence of thrombus. Normal compressibility and flow on color Doppler imaging. Femoral Vein: No evidence of thrombus. Normal compressibility, respiratory phasicity and response to augmentation. Popliteal Vein: No evidence of thrombus. Normal compressibility, respiratory phasicity and response to augmentation. Calf Veins: No evidence of thrombus. Normal compressibility and flow on color Doppler imaging. Superficial Great Saphenous Vein: No evidence  of thrombus. Normal compressibility. Venous Reflux:  None. Other Findings: There is a large (at least 9.9 x 7.1 x 3.2 cm) fluid collection within the right groin which contains internal pulsatile blood flow (image 12) worrisome for either a pseudoaneurysm (favored) or AV fistula. Note is made of an approximately 3.3 x 0.7 x 1.7 cm anechoic fluid collection with the right popliteal fossa compatible with a Baker's cyst. IMPRESSION: 1. No evidence of DVT within the right lower extremity. 2. Large (at least 9.9 cm) complex fluid collection within the right  groin which contains internal blood flow worrisome for either a pseudoaneurysm (favored) or AV fistula, potentially the sequela of recent left total hip dislocation and subsequent reduction. Further evaluation with CTA run-off could be performed as clinically indicated. Critical Value/emergent results were called by telephone at the time of interpretation on 04/09/2018 at 11:34 am to Dr. Evern Core, who verbally acknowledged these results. Electronically Signed   By: Sandi Mariscal M.D.   On: 04/09/2018 12:01    Scheduled Meds: . sodium chloride   Intravenous Once  . sodium chloride   Intravenous Once  . calcium-vitamin D  1 tablet Oral Q breakfast  . docusate sodium  100 mg Oral BID  . enoxaparin (LOVENOX) injection  30 mg Subcutaneous Q24H  . gabapentin  300 mg Oral BID WC  . gabapentin  600 mg Oral QHS  . levothyroxine  88 mcg Oral QHS  . metoprolol succinate  12.5 mg Oral Daily  . multivitamin with minerals  1 tablet Oral Daily  . pantoprazole  40 mg Oral Daily    Admission status:Inpatient: Based on patients clinical presentation and evaluation of above clinical data, I have made determination that patient meets Inpatient criteria at this time.  Time spent: 25 min  [facility] Pager [phone]. If 7PM-7AM, please contact night-coverage at [email or web address], Office  [phone]  password [email or web address]  04/11/2018, 9:52 AM  LOS: 2 days

## 2018-04-11 NOTE — Progress Notes (Signed)
Patient ID: [name], female   DOB: 1930-12-10, 85 y.o.   MRN: [id number] I came by to see [name] today.  She tolerated surgery well by Dr. Scot Dock yesterday.  She is in good spirits and has already been up taking a few steps.  Her daughter is at the bedside and we spoke in length.  Will continue to check in on her.

## 2018-04-11 NOTE — Evaluation (Signed)
Physical Therapy Evaluation Patient Details Name: [name] MRN: [id number] DOB: 28-Sep-1930 Today's Date: 04/11/2018   History of Present Illness  85 year old female with history of arthritis, GERD, hypothyroidism, LBBB who underwent right hip replacement 4 years ago, had revision of hip in October 2019 subsequently fell in November 2019 and had dislocation of the hip.  At that time attempts were made to relocate the hip twice until she subsequently underwent revision of right hip on 01/24/2018.  Since that time patient has swelling and pain in right groin.  She was seen by PCP at that time ultrasound showed large pseudoaneurysm.  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  Clinical Impression  Orders received for PT evaluation. Patient demonstrates deficits in functional mobility as indicated below. Will benefit from continued skilled PT to address deficits and maximize function. Will see as indicated and progress as tolerated.  At this time, pain limiting overall mobility. Patient very much desires to return home, recommend initial 24/7 supervision/assist upon discharge. If unable to arrange, then may need to consider ST post acute rehabilitation.    Follow Up Recommendations Home health PT;Supervision/Assistance - 24 hour(if unable to arrange initial 24/7 may need SNF)    Equipment Recommendations  None recommended by PT    Recommendations for Other Services       Precautions / Restrictions Precautions Precautions: Fall Precaution Comments: JP drain right hip Restrictions Weight Bearing Restrictions: No      Mobility  Bed Mobility Overal bed mobility: Needs Assistance Bed Mobility: Rolling;Supine to Sit Rolling: Min guard   Supine to sit: Min assist     General bed  mobility comments: Min assist to elevate trunk, increased time and effort due to pain  Transfers Overall transfer level: Needs assistance Equipment used: Rolling walker (2 wheeled) Transfers: Sit to/from [facility] Sit to Stand: Min guard Stand pivot transfers: Min guard       General transfer comment: Increased time and effort with VCs for hand placement and positioning, tolerated transfer to [facility] and sit <> stand x3  Ambulation/Gait Ambulation/Gait assistance: Min guard Gait Distance (Feet): 22 Feet Assistive device: Rolling walker (2 wheeled) Gait Pattern/deviations: Antalgic;Decreased stride length;Trunk flexed Gait velocity: decreased Gait velocity interpretation: <1.8 ft/sec, indicate of risk for recurrent falls General Gait Details: decreased  Stairs            Wheelchair Mobility    Modified Rankin (Stroke Patients Only)       Balance Overall balance assessment: Needs assistance   Sitting balance-Leahy Scale: Fair       Standing balance-Leahy Scale: Poor Standing balance comment: relaint on UE support via RW in static standing                             Pertinent Vitals/Pain Pain Assessment: 0-10 Pain Score: 6  Pain Location: hip Pain Descriptors / Indicators: Sore Pain Intervention(s): Monitored during session;Repositioned    Home Living Family/patient expects to be discharged to:: Private residence Living Arrangements: Alone Available Help at Discharge: Family;Available PRN/intermittently Type of Home: House Home Access: Stairs to enter Entrance Stairs-Rails: Right;Left Entrance Stairs-Number of Steps: 3 (back steps); 2 steps in front with no rails  Home Layout: Two level;Able to live on main level with bedroom/bathroom  Home Equipment: Shower seat - built in;Grab bars - tub/shower;Bedside commode;Walker - 2 wheels;Walker - 4 wheels;Cane - single point Additional Comments: pt had caregiver during the day, was alone  at night; daughter available to help some    Prior Function Level of Independence: Independent with assistive device(s)         Comments: used 4WW prior to admission for ambulation as needed   Caregiver helped with shower.  There 4-6 hrs a day     Hand Dominance   Dominant Hand: Right    Extremity/Trunk Assessment   Upper Extremity Assessment Upper Extremity Assessment: Generalized weakness(+ arthritic changes)    Lower Extremity Assessment Lower Extremity Assessment: Generalized weakness(limited assessment of strength due to pain)    Cervical / Trunk Assessment Cervical / Trunk Assessment: Kyphotic  Communication   Communication: No difficulties  Cognition Arousal/Alertness: Awake/alert Behavior During Therapy: WFL for tasks assessed/performed Overall Cognitive Status: Within Functional Limits for tasks assessed                                        General Comments      Exercises     Assessment/Plan    PT Assessment Patient needs continued PT services  PT Problem List Decreased strength;Decreased activity tolerance;Decreased balance;Decreased mobility;Pain       PT Treatment Interventions DME instruction;Gait training;Functional mobility training;Therapeutic activities;Therapeutic exercise;Balance training;Neuromuscular re-education;Cognitive remediation;Patient/family education    PT Goals (Current goals can be found in the Care Plan section)  Acute Rehab PT Goals Patient Stated Goal: to go home with increased assist PT Goal Formulation: With patient Time For Goal Achievement: 04/25/18 Potential to Achieve Goals: Good    Frequency Min 3X/week   Barriers to discharge Decreased caregiver support      Co-evaluation               AM-PAC PT "6 Clicks" Mobility  Outcome Measure Help needed turning from your back to your side while in a flat bed without using bedrails?: A Little Help needed moving from lying on your back to  sitting on the side of a flat bed without using bedrails?: A Little Help needed moving to and from a bed to a chair (including a wheelchair)?: A Little Help needed standing up from a chair using your arms (e.g., wheelchair or bedside chair)?: A Little Help needed to walk in hospital room?: A Little Help needed climbing 3-5 steps with a railing? : A Lot 6 Click Score: 17    End of Session Equipment Utilized During Treatment: Gait belt Activity Tolerance: Patient limited by lethargy;Patient limited by pain Patient left: in chair;with call bell/phone within reach Nurse Communication: Mobility status PT Visit Diagnosis: Unsteadiness on feet (R26.81);Difficulty in walking, not elsewhere classified (R26.2)    Time: 3300-7622 PT Time Calculation (min) (ACUTE ONLY): 20 min   Charges:   PT Evaluation $PT Eval Moderate Complexity: 1 Mod          Alben Deeds, PT DPT  Board Certified Neurologic Specialist Acute Rehabilitation Services Pager [phone] Office [phone]   Duncan Dull 04/11/2018, 9:50 AM

## 2018-04-11 NOTE — Progress Notes (Signed)
Foley DCed this am, patient tolerated well.

## 2018-04-11 NOTE — Progress Notes (Addendum)
[facility]  Subjective  - Doing well eating grits for breakfast with mild nausea.    Objective (!) 94/38 66 98.7 F (37.1 C) (Oral) 12 94%  Intake/Output Summary (Last 24 hours) at 04/11/2018 0820 Last data filed at 04/11/2018 0650 Gross per 24 hour  Intake 3975 ml  Output 2620 ml  Net 1355 ml    Right groin incision soft, firm lateral hip from chronic pseudoaneurysm No hematoma, JP drain in place 195 cc out put since surgery Distal dressing clean and dry, palpable DP pulse, mild erythema with cellulitis. Heart RRR Lungs non labored breathing   Assessment/Planning: POD # 1  1.  Retroperitoneal exposure of right external iliac artery 2.  Repair of right common femoral artery pseudoaneurysm 3.  Right common femoral artery endarterectomy 4.  Bovine pericardial patch angioplasty of right common femoral artery 5.  Right popliteal and tibial embolectomy 6.  Bovine pericardial patch angioplasty of right popliteal artery  Encouraged mobility. Maintain drain. HGB 8.6 after 3 units PRBC.  Chronic anemia and acute surgical  blood loss anemia  Stable post op disposition will work with PT and once mobile with good pain control will plan discharge home with family.  I have interviewed the patient and examined the patient. I agree with the findings by the PA. Leave JP for now.  Gae Gallop, MD [phone]   [name] 04/11/2018 8:20 AM --  Laboratory Lab Results: Recent Labs    04/10/18 0301 04/11/18 0020  WBC 4.8 8.9  HGB 7.6* 8.6*  HCT 24.1* 25.7*  PLT 153 154   BMET Recent Labs    04/10/18 0301 04/11/18 0020  NA 133* 135  K 4.9 5.3*  CL 100 105  CO2 24 25  GLUCOSE 84 125*  BUN 19 21  CREATININE 1.11* 1.30*  CALCIUM 8.9 8.1*    COAG Lab Results  Component Value Date   INR 0.97 04/21/2014   INR 0.89 06/02/2013   INR 0.91 05/02/2013   No results found for: PTT

## 2018-04-12 ENCOUNTER — Inpatient Hospital Stay (HOSPITAL_COMMUNITY): Payer: Medicare Other

## 2018-04-12 ENCOUNTER — Encounter (HOSPITAL_COMMUNITY): Payer: Self-pay | Admitting: Radiology

## 2018-04-12 DIAGNOSIS — I742 Embolism and thrombosis of arteries of the upper extremities: Secondary | ICD-10-CM

## 2018-04-12 LAB — BASIC METABOLIC PANEL
Anion gap: 6 (ref 5–15)
BUN: 37 mg/dL — ABNORMAL HIGH (ref 8–23)
CO2: 22 mmol/L (ref 22–32)
Calcium: 8.1 mg/dL — ABNORMAL LOW (ref 8.9–10.3)
Chloride: 105 mmol/L (ref 98–111)
Creatinine, Ser: 1.85 mg/dL — ABNORMAL HIGH (ref 0.44–1.00)
GFR calc non Af Amer: 24 mL/min — ABNORMAL LOW (ref >60)
GFR, EST AFRICAN AMERICAN: 28 mL/min — AB (ref >60)
Glucose, Bld: 94 mg/dL (ref 70–99)
Potassium: 4.7 mmol/L (ref 3.5–5.1)
Sodium: 133 mmol/L — ABNORMAL LOW (ref 135–145)

## 2018-04-12 LAB — CBC
HCT: 19.5 % — ABNORMAL LOW (ref 36.0–46.0)
HEMATOCRIT: 29.2 % — AB (ref 36.0–46.0)
HEMOGLOBIN: 6.3 g/dL — AB (ref 12.0–15.0)
Hemoglobin: 9.8 g/dL — ABNORMAL LOW (ref 12.0–15.0)
MCH: 29.4 pg (ref 26.0–34.0)
MCH: 29.9 pg (ref 26.0–34.0)
MCHC: 32.3 g/dL (ref 30.0–36.0)
MCHC: 33.6 g/dL (ref 30.0–36.0)
MCV: 91.1 fL (ref 80.0–100.0)
MCV: 89 fL (ref 80.0–100.0)
Platelets: 150 10*3/uL (ref 150–400)
Platelets: 166 10*3/uL (ref 150–400)
RBC: 2.14 MIL/uL — ABNORMAL LOW (ref 3.87–5.11)
RBC: 3.28 MIL/uL — ABNORMAL LOW (ref 3.87–5.11)
RDW: 15.7 % — ABNORMAL HIGH (ref 11.5–15.5)
RDW: 15.8 % — ABNORMAL HIGH (ref 11.5–15.5)
WBC: 6.9 10*3/uL (ref 4.0–10.5)
WBC: 8.6 10*3/uL (ref 4.0–10.5)
nRBC: 0 % (ref 0.0–0.2)
nRBC: 0 % (ref 0.0–0.2)

## 2018-04-12 LAB — PREPARE RBC (CROSSMATCH)

## 2018-04-12 MED ORDER — SODIUM CHLORIDE 0.9% IV SOLUTION: Freq: Once | INTRAVENOUS | Status: DC

## 2018-04-12 MED ORDER — POLYETHYLENE GLYCOL 3350 17 G PO PACK: 17.0000 g | PACK | Freq: Every day | ORAL | Status: DC | PRN

## 2018-04-12 NOTE — Evaluation (Signed)
Occupational Therapy Evaluation Patient Details Name: [name] MRN: [id number] DOB: Jan 08, 1931 Today's Date: 04/12/2018    History of Present Illness 85 year old female with history of arthritis, GERD, hypothyroidism, LBBB who underwent right hip replacement 4 years ago, had revision of hip in October 2019 subsequently fell in November 2019 and had dislocation of the hip.  At that time attempts were made to relocate the hip twice until she subsequently underwent revision of right hip on 01/24/2018.  Since that time patient has swelling and pain in right groin.  She was seen by PCP at that time ultrasound showed large pseudoaneurysm.  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   Clinical Impression   Pt lives alone with intermittent assistance for ADL and IADL of a caregiver 4 hours a day. She ambulates with a rollator. Pt presents with post operative pain, generalized weakness and decreased standing balance. She requires min to mod assist for bed mobility and min guard assist to ambulate. She needs set up to max assist for ADL. Pt reports she will have 24 hour care upon discharge. Will follow acutely.   Follow Up Recommendations  Home health OT;Supervision/Assistance - 24 hour    Equipment Recommendations  None recommended by OT    Recommendations for Other Services       Precautions / Restrictions Precautions Precautions: Fall Precaution Comments: JP drain removed 2/24 Restrictions Weight Bearing Restrictions: No      Mobility Bed Mobility Overal bed mobility: Needs Assistance Bed Mobility: Supine to Sit;Sit to Supine     Supine to sit: Min assist Sit to supine: Mod assist   General bed mobility comments: pulled up on therapist's hand to raise trunk, increased time, assist  for LEs back into bed  Transfers Overall transfer level: Needs assistance   Transfers: Sit to/from Stand Sit to Stand: Min guard         General transfer comment: increased time and effort, cues for hand placement    Balance Overall balance assessment: Needs assistance   Sitting balance-Leahy Scale: Fair       Standing balance-Leahy Scale: Poor Standing balance comment: reliant on UE support in standing with at least one hand                           ADL either performed or assessed with clinical judgement   ADL Overall ADL's : Needs assistance/impaired Eating/Feeding: Independent;Sitting   Grooming: Set up;Sitting;Wash/dry hands   Upper Body Bathing: Minimal assistance;Sitting   Lower Body Bathing: Maximal assistance;Sit to/from stand   Upper Body Dressing : Set up;Sitting   Lower Body Dressing: Maximal assistance;Sit to/from stand   Toilet Transfer: Min guard;Ambulation;RW;BSC(over toilet)   Toileting- Clothing Manipulation and Hygiene: Maximal assistance;Sit to/from stand Toileting - Clothing Manipulation Details (indicate cue type and reason): pt receiving blood, avoided using R UE     Functional mobility during ADLs: Min guard;Rolling walker       Vision Patient Visual Report: No change from baseline       Perception     Praxis      Pertinent Vitals/Pain Pain Assessment: Faces Faces Pain Scale: Hurts even more Pain Location: groin Pain Descriptors / Indicators: Sore Pain Intervention(s): Monitored during session;Premedicated before session;Repositioned     Hand Dominance  Right   Extremity/Trunk Assessment Upper Extremity Assessment Upper Extremity Assessment: Overall WFL for tasks assessed(arthritic changes in hands, crepitus in shoulders)   Lower Extremity Assessment Lower Extremity Assessment: Defer to PT evaluation   Cervical / Trunk Assessment Cervical / Trunk Assessment: Kyphotic   Communication  Communication Communication: No difficulties   Cognition Arousal/Alertness: Awake/alert Behavior During Therapy: WFL for tasks assessed/performed Overall Cognitive Status: Within Functional Limits for tasks assessed                                     General Comments       Exercises     Shoulder Instructions      Home Living Family/patient expects to be discharged to:: Private residence Living Arrangements: Alone Available Help at Discharge: Family;Personal care attendant;Friend(s);Available 24 hours/day Type of Home: House Home Access: Stairs to enter [employer] of Steps: 3 (back steps); 2 steps in front with no rails  Entrance Stairs-Rails: Right;Left Home Layout: Two level;Able to live on main level with bedroom/bathroom     Bathroom Shower/Tub: Occupational psychologist: Handicapped height     Home Equipment: [place] - built in;Grab bars - tub/shower;Bedside commode;Walker - 2 wheels;Walker - 4 wheels;Cane - single point   Additional Comments: pt will have friends, caregiver and daughter assisting around the clock      Prior Functioning/Environment Level of Independence: Independent with assistive device(s)        Comments: used 4WW prior to admission for ambulation as needed   Caregiver helped with shower.  There 4-6 hrs a day        OT Problem List: Decreased strength;Decreased activity tolerance;Impaired balance (sitting and/or standing);Decreased safety awareness;Pain;Decreased knowledge of use of DME or AE      OT Treatment/Interventions: Self-care/ADL training;DME and/or AE instruction;Balance training;Patient/family education;Therapeutic activities    OT Goals(Current goals can be found in the care plan section) Acute Rehab OT Goals Patient Stated Goal: to go home with increased assist OT Goal Formulation: With patient Time For Goal Achievement: 04/19/18 Potential to Achieve Goals: Good ADL Goals Pt Will  Perform Grooming: with supervision;standing Pt Will Perform Lower Body Bathing: with supervision;with adaptive equipment;sit to/from stand Pt Will Perform Lower Body Dressing: with supervision;with adaptive equipment;sit to/from stand Pt Will Transfer to Toilet: with supervision;ambulating Pt Will Perform Toileting - Clothing Manipulation and hygiene: with supervision;sit to/from stand Additional ADL Goal #1: Pt will perform bed mobility modified independently in preparation for ADL.  OT Frequency: Min 2X/week   Barriers to D/C:            Co-evaluation              AM-PAC OT "6 Clicks" Daily Activity     Outcome Measure Help from another person eating meals?: None Help from another person taking care of personal grooming?: A Little Help from another person toileting, which includes using toliet, bedpan, or urinal?: A Lot Help from another person bathing (including washing, rinsing, drying)?: A Lot Help from another person to put on and taking off regular upper body clothing?: None Help from another person to put on and taking off regular lower body clothing?: A Lot 6 Click Score: 17   End of Session Equipment Utilized During Treatment: Gait belt;Rolling walker Nurse Communication: Other (comment)(aware pt was able to urinate on toilet)  Activity Tolerance: Patient tolerated treatment well Patient left: in bed;with call bell/phone within  reach;with nursing/sitter in room  OT Visit Diagnosis: Unsteadiness on feet (R26.81);Other abnormalities of gait and mobility (R26.89);Pain;Muscle weakness (generalized) (M62.81)                Time: 4709-2957 OT Time Calculation (min): 15 min Charges:  OT General Charges $OT Visit: 1 Visit OT Evaluation $OT Eval Moderate Complexity: 1 Mod  Nestor Lewandowsky, OTR/L Acute Rehabilitation Services Pager: [phone] Office: [phone]  Malka So 04/12/2018, 4:16 PM

## 2018-04-12 NOTE — Progress Notes (Signed)
VASCULAR LAB PRELIMINARY  PRELIMINARY  PRELIMINARY  PRELIMINARY  Ultrasound of right groin completed.    Preliminary report:  See CV Proc  Ram Haugan, RVT 04/12/2018, 10:06 AM

## 2018-04-12 NOTE — Progress Notes (Signed)
Pt has still not voided bladder scan showed 585 cc of urine pt dosent feel like she can void, In and out cath performed 600 cc of yellow clear urine returned pt tolerated well

## 2018-04-12 NOTE — Progress Notes (Signed)
OT Cancellation Note  Patient Details Name: [name] MRN: [id number] DOB: Oct 13, 1930   Cancelled Treatment:    Reason Eval/Treat Not Completed: Patient at procedure or test/ unavailable;Other (comment). Attempted to se ept x 3 today. Pt getting blood this a.m. and reports being fatigued and requested OT return later. OT attempted again in the afternoon and pt just getting her lunch, then on third attempt pt declined stating that she wanted to finish her dessert. RN present and aware during third attempt, will check back next appropriate/availbae time  Britt Bottom 04/12/2018, 1:19 PM

## 2018-04-12 NOTE — Progress Notes (Signed)
JP drain discontinued as per orders. Gauze, tape dressing applied.  Pt tolerated well.

## 2018-04-12 NOTE — Progress Notes (Signed)
Physical Therapy Treatment Patient Details Name: [name] MRN: [id number] DOB: [birth year] Today's Date: 04/12/2018    History of Present Illness 85 year old female with history of arthritis, GERD, hypothyroidism, LBBB who underwent right hip replacement 4 years ago, had revision of hip in October 2019 subsequently fell in November 2019 and had dislocation of the hip.  At that time attempts were made to relocate the hip twice until she subsequently underwent revision of right hip on 01/24/2018.  Since that time patient has swelling and pain in right groin.  She was seen by PCP at that time ultrasound showed large pseudoaneurysm.  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    PT Comments    Patient seen for activity progression. Tolerated in room ambulation and functional task performance. Patient states that she was able to arrange additional caregiver support at home, continue to feel current POC remains appropriate.   Follow Up Recommendations  Home health PT;Supervision/Assistance - 24 hour(if unable to arrange initial 24/7 may need SNF)     Equipment Recommendations  None recommended by PT    Recommendations for Other Services       Precautions / Restrictions Precautions Precautions: Fall Precaution Comments: JP drain removed 2/24 Restrictions Weight Bearing Restrictions: No    Mobility  Bed Mobility Overal bed mobility: Needs Assistance Bed Mobility: Supine to Sit;Sit to Supine     Supine to sit: Min assist Sit to supine: Mod assist   General bed mobility comments: pulled up on therapist's hand to raise trunk, increased time, assist for LEs back into bed  Transfers Overall transfer level: Needs assistance   Transfers: Sit to/from Stand Sit to Stand: Min guard          General transfer comment: increased time and effort, cues for hand placement  Ambulation/Gait Ambulation/Gait assistance: Min guard   Assistive device: Rolling walker (2 wheeled) Gait Pattern/deviations: Antalgic;Decreased stride length;Trunk flexed Gait velocity: decreased Gait velocity interpretation: <1.8 ft/sec, indicate of risk for recurrent falls General Gait Details: decreased   Stairs             Wheelchair Mobility    Modified Rankin (Stroke Patients Only)       Balance Overall balance assessment: Needs assistance   Sitting balance-Leahy Scale: Fair       Standing balance-Leahy Scale: Poor Standing balance comment: reliant on UE support in standing with at least one hand                            Cognition Arousal/Alertness: Awake/alert Behavior During Therapy: WFL for tasks assessed/performed Overall Cognitive Status: Within Functional Limits for tasks assessed                                        Exercises      General Comments        Pertinent Vitals/Pain Pain Assessment: Faces Faces Pain Scale: Hurts even more Pain Location: groin Pain Descriptors / Indicators: Sore Pain Intervention(s): Monitored during session;Premedicated before session;Repositioned    Home Living Family/patient expects to be discharged to:: Private residence Living Arrangements: Alone Available Help at Discharge: Family;Personal care attendant;Friend(s);Available 24 hours/day Type of Home:  House Home Access: Stairs to enter Entrance Stairs-Rails: Right;Left Home Layout: Two level;Able to live on main level with bedroom/bathroom Home Equipment: Shower seat - built in;Grab bars - tub/shower;Bedside commode;Walker - 2 wheels;Walker - 4 wheels;Cane - single point Additional Comments: pt will have friends, caregiver and daughter assisting around the clock    Prior Function Level of Independence: Independent with assistive  device(s)      Comments: used 4WW prior to admission for ambulation as needed   Caregiver helped with shower.  There 4-6 hrs a day   PT Goals (current goals can now be found in the care plan section) Acute Rehab PT Goals Patient Stated Goal: to go home with increased assist PT Goal Formulation: With patient Time For Goal Achievement: 04/25/18 Potential to Achieve Goals: Good Progress towards PT goals: Progressing toward goals    Frequency    Min 3X/week      PT Plan Current plan remains appropriate    Co-evaluation              AM-PAC PT "6 Clicks" Mobility   Outcome Measure  Help needed turning from your back to your side while in a flat bed without using bedrails?: A Little Help needed moving from lying on your back to sitting on the side of a flat bed without using bedrails?: A Little Help needed moving to and from a bed to a chair (including a wheelchair)?: A Little Help needed standing up from a chair using your arms (e.g., wheelchair or bedside chair)?: A Little Help needed to walk in hospital room?: A Little Help needed climbing 3-5 steps with a railing? : A Lot 6 Click Score: 17    End of Session Equipment Utilized During Treatment: Gait belt Activity Tolerance: Patient tolerated treatment well Patient left: in bed;with call bell/phone within reach Nurse Communication: Mobility status PT Visit Diagnosis: Unsteadiness on feet (R26.81);Difficulty in walking, not elsewhere classified (R26.2)     Time: 3614-4315 PT Time Calculation (min) (ACUTE ONLY): 18 min  Charges:  $Gait Training: 8-22 mins                     Alben Deeds, PT DPT  Board Certified Neurologic Specialist [place] Pager [phone] Office [phone]    Duncan Dull 04/12/2018, 4:33 PM

## 2018-04-12 NOTE — Progress Notes (Signed)
[facility]  PROGRESS NOTE  [name] FYB:[id number] DOB: Feb 16, 1931 DOA: 04/09/2018 PCP: Darreld Mclean, MD   Brief HPI:   85 year old female with history of arthritis, GERD, hypothyroidism, LBBB who underwent right hip replacement 4 years ago, had revision of hip in October 2019 subsequently fell in November 2019 and had dislocation of the hip.  At that time attempts were made to relocate the hip twice until she subsequently underwent revision of right hip on 01/24/2018.  Since that time patient has swelling and pain in right groin.  She was seen by PCP at that time ultrasound showed large pseudoaneurysm.  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  Also revealed occlusion of the right popliteal artery.  Subjective   Patient seen and examined, denies any chest pain or shortness of breath.  Hemoglobin this morning was 6.3.  Getting blood transfusion.   Assessment/Plan:     1. Large right femoral pseudoaneurysm-seen on CTA of right lower extremity.  Status post repair of pseudoaneurysm per vascular surgery  2. RightPopliteal artery occlusion-CT with evidence of right popliteal artery occlusive emboli.  Anticoagulation as per vascular surgery recommendation.  3. Symptomatic anemia-likely from acute blood loss from surgery, hemoglobin 6.3 this morning, status post 3 units PRBC.  2 more units PRBC have been ordered Follow CBC in a.m.  FOBT was positive in January.  Patient already has an appointment to see GI as outpatient.  Does not want to get EGD in the hospital.  4. Hyponatremia-mild, sodium was 132 on admission.  Today sodium is 133.  5. Hypothyroidism-continue Synthroid.  6. Hypertension-continue metoprolol, PRN hydralazine  7. Hyperkalemia-mild, potassium is 5.3.  Follow BMP in a.m.  8. Acute kidney injury-mild creatinine is elevated 1.85.  Baseline creatinine is around 1.2.  Will  recheck BMP in a.m.     Labs for a.m : CBC, BMP  CBG: No results for input(s): GLUCAP in the last 168 hours.  CBC: Recent Labs  Lab 04/09/18 1122 04/09/18 1427 04/10/18 0301 04/11/18 0020 04/12/18 0335  WBC 5.0 4.7 4.8 8.9 6.9  NEUTROABS  --  3.2  --   --   --   HGB 8.8 Repeated and verified X2.* 8.0* 7.6* 8.6* 6.3*  HCT 26.4* 26.8* 24.1* 25.7* 19.5*  MCV 89.8 95.4 91.6 89.2 91.1  PLT 153.0 152 153 154 527    Basic Metabolic Panel: Recent Labs  Lab 04/09/18 1122 04/09/18 1427 04/10/18 0301 04/11/18 0020 04/12/18 0335  NA 132* 132* 133* 135 133*  K 4.7 4.7 4.9 5.3* 4.7  CL 97 99 100 105 105  CO2 29 25 24 25 22   GLUCOSE 82 90 84 125* 94  BUN 26* 26* 19 21 37*  CREATININE 1.19 1.10* 1.11* 1.30* 1.85*  CALCIUM 8.9 8.7* 8.9 8.1* 8.1*     DVT prophylaxis: As per vascular surgery  Code Status: Full code  Family Communication: No family at bedside  Disposition Plan: Patient be discharged home with home health PT.     Consultants:  Vascular surgery  Procedures:  Repair right femoral artery pseudoaneurysm   Antibiotics:   Anti-infectives (From admission, onward)   Start     Dose/Rate Route Frequency Ordered Stop   04/10/18 0845  ceFAZolin (ANCEF) IVPB 2g/100 mL premix    Note to Pharmacy:  Send with pt to OR   2 g 200 mL/hr over 30 Minutes Intravenous On call 04/09/18 1843  04/10/18 0900       Objective   Vitals:   04/12/18 0350 04/12/18 0800 04/12/18 0805 04/12/18 0830  BP: 126/60  125/61 (!) 122/55  Pulse: 82  77 76  Resp: (!) 21  19 13   Temp: 98.5 F (36.9 C) 98 F (36.7 C) 98 F (36.7 C) 98.1 F (36.7 C)  TempSrc: Oral Oral Oral Oral  SpO2: 95%  98% 99%  Weight:      Height:        Intake/Output Summary (Last 24 hours) at 04/12/2018 1226 Last data filed at 04/12/2018 3646 Gross per 24 hour  Intake 924.39 ml  Output 1020 ml  Net -95.61 ml   Filed Weights   04/09/18 2348  Weight: 50.8 kg     Physical  Examination:   General: Appears in no acute distress  Cardiovascular: S1-S2, regular  Respiratory: Clear to auscultation bilaterally  Abdomen: Abdomen is soft, nontender, no organomegaly  Extremities: No edema in the lower extremities  Neurologic: Alert, and x3, no focal deficit noted.     Data Reviewed: I have personally reviewed following labs and imaging studies   Recent Results (from the past 240 hour(s))  MRSA PCR Screening     Status: None   Collection Time: 04/09/18 11:46 PM  Result Value Ref Range Status   MRSA by PCR NEGATIVE NEGATIVE Final    Comment:        The GeneXpert MRSA Assay (FDA approved for NASAL specimens only), is one component of a comprehensive MRSA colonization surveillance program. It is not intended to diagnose MRSA infection nor to guide or monitor treatment for MRSA infections. Performed at [facility], [place] [address] [place], [place] [address]      Liver Function Tests: Recent Labs  Lab 04/11/18 0020  AST 19  ALT 11  ALKPHOS 35*  BILITOT 0.7  PROT 4.8*  ALBUMIN 2.6*   No results for input(s): LIPASE, AMYLASE in the last 168 hours. No results for input(s): AMMONIA in the last 168 hours.  Cardiac Enzymes: No results for input(s): CKTOTAL, CKMB, CKMBINDEX, TROPONINI in the last 168 hours. BNP (last 3 results) No results for input(s): BNP in the last 8760 hours.  ProBNP (last 3 results) Recent Labs    02/18/18 1422 04/09/18 1122  PROBNP 192.0* 333.0*      Studies: Ct Pelvis Wo Contrast  Result Date: 04/12/2018 CLINICAL DATA:  Right leg and groin swelling, common femoral artery pseudoaneurysm repair 2 days ago EXAM: CT PELVIS WITHOUT CONTRAST TECHNIQUE: Multidetector CT imaging of the pelvis was performed following the standard protocol without intravenous contrast. IV contrast could not be administered due to low GFR. COMPARISON:  Ultrasound of 04/09/2018 FINDINGS: Urinary Tract:  Grossly unremarkable Bowel:   Grossly unremarkable Vascular/Lymphatic: Aortoiliac atherosclerotic vascular disease. The right external iliac vessels and common femoral vessels are obscured by streak artifact from the right hip implant. There is a surgical drain and running just lateral to the right common femoral artery, with some indistinctness of the margins of the common femoral vessels. Reproductive: Region obscured by streak artifact from the hip implants. Other:  No supplemental non-categorized findings. Musculoskeletal: There is a considerable amount of soft tissue prominence along the right iloipsoas and gluteal musculature as well as around the right hip joint especially anteriorly. This extends along the proximal vastus musculature, and a component of hemarthrosis is not excluded. There is a tiny locule of gas in the vicinity of the indistinct right rectus femoris muscle  on image 41/10. There are small locules of gas along the right lower quadrant anterior abdominal wall musculature which appears thickened. I expect that much of this appearance is postoperative given the required retroperitoneal exposure of the right external iliac artery. There is potentially some hematoma along the right pelvic sidewall for example on image 31/10, partially obscured by the streak artifact from the hip hardware. Suspected right anterior acetabular stress fracture shown on images 111 through 99 of series 5. Known fracture of the right greater trochanter observed with posterior fragments. Prominent dextroconvex lumbar scoliosis with chronic bilateral pars defects at L5 and 10 mm of anterolisthesis of L5 on S1. Chronic superior endplate compression at L3. Left acetabular labral and pubic symphysis chondrocalcinosis. IMPRESSION: 1. Soft tissue density favoring hematoma primarily along the right anterior hip and groin region, particularly along the right iloipsoas and anterior gluteal musculature as well as around the right hip joint and proximal vastus  musculature. A component of hemarthrosis is a possibility. It is difficult to know how much of this may be the result of the prior pseudoaneurysm that was repaired, versus continuing hematoma. I reviewed this case in person with Dr. Gae Gallop and we discussed the utility of performing a Doppler ultrasound to assess for defect or leak from the vessel previously repaired. Unfortunately, CT angiography is not feasible due to the patient's GFR, and the hardware from the hip implant obscures the surrounding soft tissues. 2. There is some postoperative findings in the region including a small amount of gas loculation and thickening/hematoma tracking along the right lower anterior abdominal wall musculature, not unexpected in the setting of prior retroperitoneal exposure of the right external iliac artery. 3. Suspected stress fracture of the right upper anterior acetabulum, best shown on the coronal multilobar matted images. There is also a subacute fracture of the greater trochanter which is a known finding based on prior imaging and operative notes. 4. A drain is present in the right groin region extending just lateral to the common femoral vasculature. 5. Prominent lumbar scoliosis. 6. Chronic pars defects at L5 with 10 mm anterolisthesis of L5 on S1. 7. Chronic superior endplate compression at L3 8. 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 Electronically Signed   By: Van Clines M.D.   On: 04/12/2018 07:53   Vas [place] Lower Extremity Arterial Duplex  Result Date: 04/12/2018 LOWER EXTREMITY ARTERIAL DUPLEX STUDY Indications: Post Pseudoaneurysm repair.  Current ABI: n/a Comparison Study: Prior study from 04/09/18 is available Performing Technologist: Sharion Dove RVS  Examination Guidelines: A complete evaluation includes B-mode imaging, spectral Doppler, color Doppler, and power Doppler as needed of all accessible portions of each vessel. Bilateral testing is considered an integral part  of a complete examination. Limited examinations for reoccurring indications may be performed as noted.  Summary: Right: No obvious evidence of pseudoaneurysm noted.  See table(s) above for measurements and observations.    Preliminary     Scheduled Meds: . sodium chloride   Intravenous Once  . sodium chloride   Intravenous Once  . sodium chloride   Intravenous Once  . calcium-vitamin D  1 tablet Oral Q breakfast  . docusate sodium  100 mg Oral BID  . enoxaparin (LOVENOX) injection  30 mg Subcutaneous Q24H  . gabapentin  300 mg Oral BID WC  . gabapentin  600 mg Oral QHS  . levothyroxine  88 mcg Oral QHS  . metoprolol succinate  12.5 mg Oral Daily  . multivitamin with minerals  1 tablet  Oral Daily  . pantoprazole  40 mg Oral Daily    Admission status:Inpatient: Based on patients clinical presentation and evaluation of above clinical data, I have made determination that patient meets Inpatient criteria at this time.  Time spent: 25 min  [facility] Pager [phone]. If 7PM-7AM, please contact night-coverage at [email or web address], Office  [phone]  password [email or web address]  04/12/2018, 12:26 PM  LOS: 3 days

## 2018-04-12 NOTE — Progress Notes (Signed)
   VASCULAR SURGERY ASSESSMENT & PLAN:   2 Days Post-Op s/p: Repair of right femoral artery pseudoaneurysm and popliteal and tibial embolectomy.  She had some drop in her hemoglobin and there has been some continued concern about swelling in the right groin.  I did obtain a CT scan that was done without contrast because of her renal insufficiency.  This does not show any significant swelling around the arterial repair the swelling is mostly lateral.  However there is a lot of artifact because of the hip.  For this reason I obtained a duplex scan that shows no evidence of pseudoaneurysm or bleeding from the repaired artery.  Most of the swelling appears to be lateral from her previous hip surgery.  We removed her JP today.  Okay to mobilize from my standpoint.  She is being transfused as per the medical service.  SUBJECTIVE:   No complaints.  PHYSICAL EXAM:   Vitals:   04/12/18 0350 04/12/18 0800 04/12/18 0805 04/12/18 0830  BP: 126/60  125/61 (!) 122/55  Pulse: 82  77 76  Resp: (!) 21  19 13   Temp: 98.5 F (36.9 C) 98 F (36.7 C) 98 F (36.7 C) 98.1 F (36.7 C)  TempSrc: Oral Oral Oral Oral  SpO2: 95%  98% 99%  Weight:      Height:       Moderate swelling right hip and groin. Her incisions look fine.  LABS:   Lab Results  Component Value Date   WBC 6.9 04/12/2018   HGB 6.3 (LL) 04/12/2018   HCT 19.5 (L) 04/12/2018   MCV 91.1 04/12/2018   PLT 150 04/12/2018   Lab Results  Component Value Date   CREATININE 1.85 (H) 04/12/2018   Lab Results  Component Value Date   INR 0.97 04/21/2014   CBG (last 3)  No results for input(s): GLUCAP in the last 72 hours.  PROBLEM LIST:    Principal Problem:   Femoral artery pseudo-aneurysm, right (HCC) Active Problems:   Symptomatic anemia   Elevated cholesterol   Hyponatremia   Popliteal artery occlusion, right (HCC)   HTN (hypertension)   CURRENT MEDS:   . sodium chloride   Intravenous Once  . sodium chloride    Intravenous Once  . sodium chloride   Intravenous Once  . calcium-vitamin D  1 tablet Oral Q breakfast  . docusate sodium  100 mg Oral BID  . enoxaparin (LOVENOX) injection  30 mg Subcutaneous Q24H  . gabapentin  300 mg Oral BID WC  . gabapentin  600 mg Oral QHS  . levothyroxine  88 mcg Oral QHS  . metoprolol succinate  12.5 mg Oral Daily  . multivitamin with minerals  1 tablet Oral Daily  . pantoprazole  40 mg Oral Daily    [name] Beeper: [phone] Office: [phone] 04/12/2018

## 2018-04-12 NOTE — Progress Notes (Signed)
CRITICAL VALUE ALERT  Critical Value:  HGB 6.3  Date & Time Notied:  12 Apr 2018  Provider Notified: X blount, C dickson   Orders Received/Actions taken:CT and awaiting orders

## 2018-04-12 NOTE — Progress Notes (Signed)
Pt has no documented urine since 1300 hors and cant remember if she has voided since then, scanned bladder largest amount was 158 cc, pt denies need to void will re scan bladder  In 3 hours

## 2018-04-13 ENCOUNTER — Encounter (HOSPITAL_COMMUNITY): Payer: Self-pay

## 2018-04-13 DIAGNOSIS — D649 Anemia, unspecified: Secondary | ICD-10-CM

## 2018-04-13 DIAGNOSIS — I70201 Unspecified atherosclerosis of native arteries of extremities, right leg: Secondary | ICD-10-CM

## 2018-04-13 DIAGNOSIS — R195 Other fecal abnormalities: Secondary | ICD-10-CM

## 2018-04-13 LAB — TYPE AND SCREEN
ABO/RH(D): A POS
Antibody Screen: NEGATIVE
UNIT DIVISION: 0
UNIT DIVISION: 0
Unit division: 0
Unit division: 0
Unit division: 0
Unit division: 0
Unit division: 0
Unit division: 0

## 2018-04-13 LAB — BPAM RBC
BLOOD PRODUCT EXPIRATION DATE: 202003172359
Blood Product Expiration Date: 202003172359
Blood Product Expiration Date: 202003172359
Blood Product Expiration Date: 202003172359
Blood Product Expiration Date: 202003172359
Blood Product Expiration Date: 202003182359
Blood Product Expiration Date: 202003182359
Blood Product Expiration Date: 202003262359
ISSUE DATE / TIME: 202002220907
ISSUE DATE / TIME: 202002220907
ISSUE DATE / TIME: 202002221031
ISSUE DATE / TIME: 202002241253
ISSUE DATE / TIME: 202002220636
ISSUE DATE / TIME: 202002240751
UNIT TYPE AND RH: 6200
Unit Type and Rh: 5100
Unit Type and Rh: 6200
Unit Type and Rh: 6200
Unit Type and Rh: 6200
Unit Type and Rh: 6200
Unit Type and Rh: 6200
Unit Type and Rh: 6200

## 2018-04-13 LAB — CBC
HCT: 26.6 % — ABNORMAL LOW (ref 36.0–46.0)
HCT: 27 % — ABNORMAL LOW (ref 36.0–46.0)
Hemoglobin: 8.7 g/dL — ABNORMAL LOW (ref 12.0–15.0)
Hemoglobin: 8.6 g/dL — ABNORMAL LOW (ref 12.0–15.0)
MCH: 29.1 pg (ref 26.0–34.0)
MCH: 28.8 pg (ref 26.0–34.0)
MCHC: 32.7 g/dL (ref 30.0–36.0)
MCHC: 31.9 g/dL (ref 30.0–36.0)
MCV: 89 fL (ref 80.0–100.0)
MCV: 90.3 fL (ref 80.0–100.0)
Platelets: 172 10*3/uL (ref 150–400)
Platelets: 187 10*3/uL (ref 150–400)
RBC: 2.99 MIL/uL — ABNORMAL LOW (ref 3.87–5.11)
RBC: 2.99 MIL/uL — ABNORMAL LOW (ref 3.87–5.11)
RDW: 15.9 % — ABNORMAL HIGH (ref 11.5–15.5)
RDW: 15.9 % — AB (ref 11.5–15.5)
WBC: 8.3 10*3/uL (ref 4.0–10.5)
WBC: 7.2 10*3/uL (ref 4.0–10.5)
nRBC: 0 % (ref 0.0–0.2)
nRBC: 0 % (ref 0.0–0.2)

## 2018-04-13 LAB — BASIC METABOLIC PANEL
Anion gap: 7 (ref 5–15)
BUN: 29 mg/dL — ABNORMAL HIGH (ref 8–23)
CO2: 22 mmol/L (ref 22–32)
Calcium: 8.4 mg/dL — ABNORMAL LOW (ref 8.9–10.3)
Chloride: 105 mmol/L (ref 98–111)
Creatinine, Ser: 1.12 mg/dL — ABNORMAL HIGH (ref 0.44–1.00)
GFR calc Af Amer: 52 mL/min — ABNORMAL LOW (ref >60)
GFR calc non Af Amer: 45 mL/min — ABNORMAL LOW (ref >60)
GLUCOSE: 109 mg/dL — AB (ref 70–99)
Potassium: 4.7 mmol/L (ref 3.5–5.1)
Sodium: 134 mmol/L — ABNORMAL LOW (ref 135–145)

## 2018-04-13 MED ORDER — POLYETHYLENE GLYCOL 3350 17 G PO PACK
17.0000 g | PACK | Freq: Two times a day (BID) | ORAL | Status: DC
  Administered 2018-04-13: 17 g via ORAL
  Filled 2018-04-13 (×3): qty 1

## 2018-04-13 NOTE — Anesthesia Postprocedure Evaluation (Signed)
Anesthesia Post Note  Patient: [name]  Procedure(s) Performed: REPAIR RIGHT FEMORAL ARTERY PSEUDOANEURYSM, RETROPERITONEAL EXPOSURE OF ILIAC ARTERY, RIGHT POPLITEAL EMBOLECTOMY (Right ) PATCH  ANGIOPLASTY OF RIGHT FEMORAL ARTERY USING BOVINE PATCH, PATCH ANGIOPLASTY OF RIGHT POPLITEAL ARTERY USING BOVINE PATCH (Right )     Patient location during evaluation: PACU Anesthesia Type: General Level of consciousness: awake and alert Pain management: pain level controlled Vital Signs Assessment: post-procedure vital signs reviewed and stable Respiratory status: spontaneous breathing, nonlabored ventilation, respiratory function stable and patient connected to nasal cannula oxygen Cardiovascular status: blood pressure returned to baseline and stable Postop Assessment: no apparent nausea or vomiting Anesthetic complications: no    Last Vitals:  Vitals:   04/13/18 1754 04/13/18 1954  BP: 124/64 120/81  Pulse:  93  Resp: 19 (!) 21  Temp: 37.1 C 37.2 C  SpO2:  95%    Last Pain:  Vitals:   04/13/18 2148  TempSrc:   PainSc: 0-No pain                 Eliazer Hemphill

## 2018-04-13 NOTE — Progress Notes (Signed)
[facility]  PROGRESS NOTE  [name] XBL:[id number] DOB: 03-30-1930 DOA: 04/09/2018 PCP: Darreld Mclean, MD   Brief HPI:   85 year old female with history of arthritis, GERD, hypothyroidism, LBBB who underwent right hip replacement 4 years ago, had revision of hip in October 2019 subsequently fell in November 2019 and had dislocation of the hip.  At that time attempts were made to relocate the hip twice until she subsequently underwent revision of right hip on 01/24/2018.  Since that time patient has swelling and pain in right groin.  She was seen by PCP at that time ultrasound showed large pseudoaneurysm.  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  Also revealed occlusion of the right popliteal artery.  Subjective   Patient seen and examined, required and out cath for urine retention last night.  Hemoglobin dropped to 8.7 this morning, repeat hemoglobin is again 8.6.  She has a history of positive FOBT and has an appointment to see GI as outpatient.   Assessment/Plan:     1. Large right femoral pseudoaneurysm-seen on CTA of right lower extremity.  Status post repair of pseudoaneurysm per vascular surgery  2. RightPopliteal artery occlusion-CT with evidence of right popliteal artery occlusive emboli.  Anticoagulation as per vascular surgery recommendation.  3. Symptomatic anemia-likely from acute blood loss from surgery, hemoglobin 6.3 this morning, status post 3 units PRBC.  2 more units PRBC have been ordered Follow CBC in a.m.  FOBT was positive in January.  Will consult GI for possible EGD in the hospital.  4. Hyponatremia-mild, sodium was 132 on admission.  Today sodium is 133.  5. Hypothyroidism-continue Synthroid.  6. Hypertension-continue metoprolol, PRN hydralazine  7. Hyperkalemia-mild, potassium is 5.3.  Follow BMP in a.m.  8. Acute kidney injury-mild creatinine is elevated  1.85.  Baseline creatinine is around 1.2.  Will recheck BMP in a.m.    CBG: No results for input(s): GLUCAP in the last 168 hours.  CBC: Recent Labs  Lab 04/09/18 1427  04/11/18 0020 04/12/18 0335 04/12/18 1955 04/13/18 0240 04/13/18 1241  WBC 4.7   < > 8.9 6.9 8.6 8.3 7.2  NEUTROABS 3.2  --   --   --   --   --   --   HGB 8.0*   < > 8.6* 6.3* 9.8* 8.7* 8.6*  HCT 26.8*   < > 25.7* 19.5* 29.2* 26.6* 27.0*  MCV 95.4   < > 89.2 91.1 89.0 89.0 90.3  PLT 152   < > 154 150 166 172 187   < > = values in this interval not displayed.    Basic Metabolic Panel: Recent Labs  Lab 04/09/18 1427 04/10/18 0301 04/11/18 0020 04/12/18 0335 04/13/18 0240  NA 132* 133* 135 133* 134*  K 4.7 4.9 5.3* 4.7 4.7  CL 99 100 105 105 105  CO2 25 24 25 22 22   GLUCOSE 90 84 125* 94 109*  BUN 26* 19 21 37* 29*  CREATININE 1.10* 1.11* 1.30* 1.85* 1.12*  CALCIUM 8.7* 8.9 8.1* 8.1* 8.4*     DVT prophylaxis: As per vascular surgery  Code Status: Full code  Family Communication: No family at bedside  Disposition Plan: Patient be discharged home with home health PT.     Consultants:  Vascular surgery  Procedures:  Repair right femoral artery pseudoaneurysm   Antibiotics:   Anti-infectives (From admission, onward)   Start  Dose/Rate Route Frequency Ordered Stop   04/10/18 0845  ceFAZolin (ANCEF) IVPB 2g/100 mL premix    Note to Pharmacy:  Send with pt to OR   2 g 200 mL/hr over 30 Minutes Intravenous On call 04/09/18 1843 04/10/18 0900       Objective   Vitals:   04/13/18 0700 04/13/18 0900 04/13/18 0948 04/13/18 1100  BP:   121/62   Pulse: 73  91 94  Resp: 13 20  (!) 27  Temp:      TempSrc:      SpO2: 96%   99%  Weight:      Height:        Intake/Output Summary (Last 24 hours) at 04/13/2018 1305 Last data filed at 04/13/2018 0751 Gross per 24 hour  Intake 555 ml  Output 930 ml  Net -375 ml   Filed Weights   04/09/18 2348  Weight: 50.8 kg     Physical  Examination:   General: Appears in no acute distress, sitting comfortably  Cardiovascular: S1-S2, regular, no murmur auscultated  Respiratory: Clear to auscultation bilaterally  Abdomen: Abdomen is soft, nontender, no organomegaly  Extremities: No edema in the lower extremity, tenderness in the right groin area  Neurologic: Alert, oriented x3, no focal deficit noted     Data Reviewed: I have personally reviewed following labs and imaging studies   Recent Results (from the past 240 hour(s))  MRSA PCR Screening     Status: None   Collection Time: 04/09/18 11:46 PM  Result Value Ref Range Status   MRSA by PCR NEGATIVE NEGATIVE Final    Comment:        The GeneXpert MRSA Assay (FDA approved for NASAL specimens only), is one component of a comprehensive MRSA colonization surveillance program. It is not intended to diagnose MRSA infection nor to guide or monitor treatment for MRSA infections. Performed at [facility], [place] [address], [place], [place] [address]      Liver Function Tests: Recent Labs  Lab 04/11/18 0020  AST 19  ALT 11  ALKPHOS 35*  BILITOT 0.7  PROT 4.8*  ALBUMIN 2.6*    ProBNP (last 3 results) Recent Labs    02/18/18 1422 04/09/18 1122  PROBNP 192.0* 333.0*      Studies: Ct Pelvis Wo Contrast  Result Date: 04/12/2018 CLINICAL DATA:  Right leg and groin swelling, common femoral artery pseudoaneurysm repair 2 days ago EXAM: CT PELVIS WITHOUT CONTRAST TECHNIQUE: Multidetector CT imaging of the pelvis was performed following the standard protocol without intravenous contrast. IV contrast could not be administered due to low GFR. COMPARISON:  Ultrasound of 04/09/2018 FINDINGS: Urinary Tract:  Grossly unremarkable Bowel:  Grossly unremarkable Vascular/Lymphatic: Aortoiliac atherosclerotic vascular disease. The right external iliac vessels and common femoral vessels are obscured by streak artifact from the right hip implant. There is a  surgical drain and running just lateral to the right common femoral artery, with some indistinctness of the margins of the common femoral vessels. Reproductive: Region obscured by streak artifact from the hip implants. Other:  No supplemental non-categorized findings. Musculoskeletal: There is a considerable amount of soft tissue prominence along the right iloipsoas and gluteal musculature as well as around the right hip joint especially anteriorly. This extends along the proximal vastus musculature, and a component of hemarthrosis is not excluded. There is a tiny locule of gas in the vicinity of the indistinct right rectus femoris muscle on image 41/10. There are small locules of gas along the  right lower quadrant anterior abdominal wall musculature which appears thickened. I expect that much of this appearance is postoperative given the required retroperitoneal exposure of the right external iliac artery. There is potentially some hematoma along the right pelvic sidewall for example on image 31/10, partially obscured by the streak artifact from the hip hardware. Suspected right anterior acetabular stress fracture shown on images 111 through 99 of series 5. Known fracture of the right greater trochanter observed with posterior fragments. Prominent dextroconvex lumbar scoliosis with chronic bilateral pars defects at L5 and 10 mm of anterolisthesis of L5 on S1. Chronic superior endplate compression at L3. Left acetabular labral and pubic symphysis chondrocalcinosis. IMPRESSION: 1. Soft tissue density favoring hematoma primarily along the right anterior hip and groin region, particularly along the right iloipsoas and anterior gluteal musculature as well as around the right hip joint and proximal vastus musculature. A component of hemarthrosis is a possibility. It is difficult to know how much of this may be the result of the prior pseudoaneurysm that was repaired, versus continuing hematoma. I reviewed this case in  person with Dr. Gae Gallop and we discussed the utility of performing a Doppler ultrasound to assess for defect or leak from the vessel previously repaired. Unfortunately, CT angiography is not feasible due to the patient's GFR, and the hardware from the hip implant obscures the surrounding soft tissues. 2. There is some postoperative findings in the region including a small amount of gas loculation and thickening/hematoma tracking along the right lower anterior abdominal wall musculature, not unexpected in the setting of prior retroperitoneal exposure of the right external iliac artery. 3. Suspected stress fracture of the right upper anterior acetabulum, best shown on the coronal multilobar matted images. There is also a subacute fracture of the greater trochanter which is a known finding based on prior imaging and operative notes. 4. A drain is present in the right groin region extending just lateral to the common femoral vasculature. 5. Prominent lumbar scoliosis. 6. Chronic pars defects at L5 with 10 mm anterolisthesis of L5 on S1. 7. Chronic superior endplate compression at L3 8. 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 Electronically Signed   By: Van Clines M.D.   On: 04/12/2018 07:53   Vas [place] Lower Extremity Arterial Duplex  Result Date: 04/12/2018 LOWER EXTREMITY ARTERIAL DUPLEX STUDY Indications: Post Pseudoaneurysm repair.  Current ABI: n/a Comparison Study: Prior study from 04/09/18 is available Performing Technologist: Sharion Dove RVS  Examination Guidelines: A complete evaluation includes B-mode imaging, spectral Doppler, color Doppler, and power Doppler as needed of all accessible portions of each vessel. Bilateral testing is considered an integral part of a complete examination. Limited examinations for reoccurring indications may be performed as noted.  Summary: Right: No obvious evidence of pseudoaneurysm noted.  See table(s) above for measurements and  observations. Electronically signed by Ruta Hinds MD on 04/12/2018 at 3:33:30 PM.    Final     Scheduled Meds: . sodium chloride   Intravenous Once  . sodium chloride   Intravenous Once  . sodium chloride   Intravenous Once  . calcium-vitamin D  1 tablet Oral Q breakfast  . docusate sodium  100 mg Oral BID  . enoxaparin (LOVENOX) injection  30 mg Subcutaneous Q24H  . gabapentin  300 mg Oral BID WC  . gabapentin  600 mg Oral QHS  . levothyroxine  88 mcg Oral QHS  . metoprolol succinate  12.5 mg Oral Daily  . multivitamin with minerals  1 tablet  Oral Daily  . pantoprazole  40 mg Oral Daily  . polyethylene glycol  17 g Oral BID    Admission status:Inpatient: Based on patients clinical presentation and evaluation of above clinical data, I have made determination that patient meets Inpatient criteria at this time.  Time spent: 25 min  [facility] Pager [phone]. If 7PM-7AM, please contact night-coverage at [email or web address], Office  [phone]  password [email or web address]  04/13/2018, 1:05 PM  LOS: 4 days

## 2018-04-13 NOTE — Plan of Care (Signed)
  Problem: Education: Goal: Knowledge of General Education information will improve Description Including pain rating scale, medication(s)/side effects and non-pharmacologic comfort measures Outcome: Progressing   Problem: Health Behavior/Discharge Planning: Goal: Ability to manage health-related needs will improve Outcome: Progressing   Problem: Clinical Measurements: Goal: Ability to maintain clinical measurements within normal limits will improve Outcome: Progressing Goal: Will remain free from infection Outcome: Progressing Goal: Diagnostic test results will improve Outcome: Progressing Goal: Respiratory complications will improve Outcome: Progressing Goal: Cardiovascular complication will be avoided Outcome: Progressing   Problem: Nutrition: Goal: Adequate nutrition will be maintained Outcome: Progressing   Problem: Elimination: Goal: Will not experience complications related to bowel motility Outcome: Progressing Goal: Will not experience complications related to urinary retention Outcome: Progressing

## 2018-04-13 NOTE — Care Management Important Message (Signed)
Important Message  Patient Details  Name: [name] MRN: [id number] Date of Birth: 1930-08-01   Medicare Important Message Given:  Yes    Shelda Altes 04/13/2018, 12:42 PM

## 2018-04-13 NOTE — H&P (View-Only) (Signed)
Referring Provider: [facility]   Primary Care Physician:  Darreld Mclean, MD   Primary Gastroenterologist: none. Was supposed to be seen by one of the [facility] in [facility] but hospitalized in interim.      Reason for Consultation:  anemia    ASSESSMENT / PLAN:    5. 85 yo female with [place] anemia, dark, heme + stools requiring repeated transfusions.She has taken Meloxicam for years. Rule out PUD. Had hip surgery in December complicated by post-op anemia requiring several units of blood. Hgb stabilized ~ 10 but since then has been declining and she reports dark stools since the surgery. Hgb has suboptimally responded to more PRBCs this admission.  She did undergo vascular surgery three days ago but blood loss presumably minimal.  -She is getting daily Lovenox, will she need anticoagulation upon discharge for right popliteal emboli?  -Given dark, heme + stool if is reasonable to proceed with EGD this admission. The risks and benefits of EGD were discussed and the patient agrees to proceed. Will see if procedure can be done tomorrow -continue daily PPI -trend hgb -Holding am Lovenox dose. Will make recommendations about timing of restart following EGD tomorrow.   2. Constipation. Last BM on Friday. Got Dulcolax today and starting BID Miralax this evening.    HPI:      HPI: [name] is a 85 y.o. female with hx of Stage IIA non-small cell lung cancer diagnosed 2015. She is s/p right lung resection but declined adjuvant systemic chemotherapy. No disease recurrence on chest CT scan early Jan 2020. Patient is s/p right hip repair 4 years ago. She has since fallen at home requiring revision of the hip. [place] again in Nov 2019 and dislocated her hip. Had another hip revieion in December 2019 and had problems with right groin swelling since. Imaging showed large right femoral pseudoaneurysm. She has been having right calf pain and imaging also showed abrupt occulusion of  popliteal artery. She is now s/p repair of the pseudoaneurysm on 04/10/18. On anticoagulation for right popliteal artery emboli .   Patient has chronic anemia with baseline hgb in low 10 range. Hgb declined ~ 3 units following hip surgery in December. Following blood transfusion hgb rose to 10. Follow up labs with PCP early January showed decline in hgb to 8.   No overt bleeding, PCP ordered hemoccults which were positive. Hgb improved over following couple of weeks (without transfusion) and was 10 on 03/12/18. The next time it was checked was when patient came to ED with leg pain on 04/09/18. At that time he was 8.8, declined further to 7.6. She was transfused 3 units of pRBC on 04/10/18 and hgb rose to 8.6. Yesterday morning it was down to 6.3, transfused another 2 units with bump in hgb to 9.8 yesterday. This am she is down a gram to 8.6.   Patient has been getting daily Lovenox injections. No overt GI bleeding but FOBT+.     Past Medical History:  Diagnosis Date  . Arthralgia of multiple joints    followed by dr Gerilyn Nestle  . Arthritis   . Cardiomyopathy ([place])   . Chronic constipation   . Chronic inflammatory arthritis    rhemotolgist-  dr a. Gerilyn Nestle [facility])  . Dry eyes    eye drops used   . GERD (gastroesophageal reflux disease)   . H/O discoid lupus erythematosus   . Hiatal hernia   . History of colon polyps   .  Hypothyroidism   . Iron deficiency anemia   . LBBB (left bundle branch block) 2010  . Mitchell's disease (erythromelalgia) [facility])    neurologist-  dr patel  . Nocturia   . Non-small cell cancer of right lung [facility]) surgeon-- dr gerhardt/  oncologist-  dr Julien Nordmann--- per lov notes no recurrence/   11-18-2017 per pt denies any symptoms   dx 2015--  Stage IIA (T2b,N0,M0) , +EGFR  mutation in exon 21, non-small cell adenocarcinoma right upper lobe---  s/p  Right upper lobectomy , right middley wedge resection and node dissection---  no chemo or radiation therapy  . OA  (osteoarthritis)    hands  . Osteoporosis   . PONV (postoperative nausea and vomiting)    likes phenergan  . Raynaud's phenomenon 1965  . Renal insufficiency   . Rheumatoid arthritis ([place])   . Sciatica   . Scoliosis   . Sjogren's syndrome [facility])     Past Surgical History:  Procedure Laterality Date  . ANTERIOR HIP REVISION Right 11/27/2017   Procedure: RIGHT HIP ACETABULAR REVISION;  Surgeon: [name], MD;  Location: [facility];  Service: Orthopedics;  Laterality: Right;  . ANTERIOR HIP REVISION Right 01/24/2018   Procedure: OPEN REDUCTION OF DISLOCATED ANTERIOR HIP WITH REVISION OF LINER AND HIP BALL;  Surgeon: [name], MD;  Location: [facility];  Service: Orthopedics;  Laterality: Right;  . APPENDECTOMY  1950s  . CARDIOVASCULAR STRESS TEST  12/2008    mild fixed basal to mid septal perfusion defect felt likely due to artifact from LBBB, no ischemia, EF 58%  . COLONOSCOPY    . FEMORAL-POPLITEAL BYPASS GRAFT Right 04/10/2018   Procedure: REPAIR RIGHT FEMORAL ARTERY PSEUDOANEURYSM, RETROPERITONEAL EXPOSURE OF ILIAC ARTERY, RIGHT POPLITEAL EMBOLECTOMY;  Surgeon: Angelia Mould, MD;  Location: [place];  Service: Vascular;  Laterality: Right;  . LYMPH NODE DISSECTION Right 06/07/2013   Procedure: LYMPH NODE DISSECTION;  Surgeon: [name], MD;  Location: [place];  Service: Thoracic;  Laterality: Right;  . PATCH ANGIOPLASTY Right 04/10/2018   Procedure: PATCH  ANGIOPLASTY OF RIGHT FEMORAL ARTERY USING BOVINE PATCH, PATCH ANGIOPLASTY OF RIGHT POPLITEAL ARTERY USING BOVINE PATCH;  Surgeon: Angelia Mould, MD;  Location: [place];  Service: Vascular;  Laterality: Right;  . [place]   "large incision from chest to up to shoulder, the nerves were tied together, for raynaud's  . THORACOTOMY  06/07/2013   Procedure: MINI/LIMITED THORACOTOMY; right middle lobe wedge resection;  Surgeon: [name], MD;  Location: [place];  Service:  Thoracic;;  . TONSILLECTOMY  child  . TOTAL ABDOMINAL HYSTERECTOMY  1980's    W/ BSO  . TOTAL HIP ARTHROPLASTY Right 04/28/2014   Procedure: RIGHT TOTAL HIP ARTHROPLASTY ANTERIOR APPROACH;  Surgeon: [name], MD;  [facility];  Service: Orthopedics;  Laterality: Right;  . TRANSTHORACIC ECHOCARDIOGRAM  12/11/2008   ef 55-37%, grade 1 diastolic dysfunction/  mild LAE/  mild AR and MR/  trivial TR  . VIDEO ASSISTED THORACOSCOPY (VATS)/WEDGE RESECTION Right 06/07/2013   Procedure: VIDEO ASSISTED THORACOSCOPY (VATS)/right upper lobectomy, On Q;  Surgeon: [name], MD;  Location: [place];  Service: Thoracic;  Laterality: Right;  Marland Kitchen VIDEO BRONCHOSCOPY N/A 06/07/2013   Procedure: VIDEO BRONCHOSCOPY;  Surgeon: [name], MD;  Location: [place];  Service: Thoracic;  Laterality: N/A;  . VIDEO BRONCHOSCOPY WITH ENDOBRONCHIAL NAVIGATION N/A 05/04/2013   Procedure: VIDEO BRONCHOSCOPY WITH ENDOBRONCHIAL NAVIGATION;  Surgeon: [name],  MD;  [facility];  Service: Thoracic;  Laterality: N/A;    Prior to Admission medications   Medication Sig Start Date End Date Taking? Authorizing Provider  Biotin 1000 MCG tablet Take 1,000 mcg by mouth daily.    Yes [provider]  Calcium Carbonate-Vitamin D (CALCIUM 500 + D) 500-125 MG-UNIT TABS Take 1 tablet by mouth daily.    Yes [provider]  docusate sodium (STOOL SOFTENER) 100 MG capsule Take 100 mg by mouth 2 (two) times daily.     Yes [provider]  furosemide (LASIX) 20 MG tablet TAKE 1 TABLET BY MOUTH EVERY DAY Patient taking differently: Take 20 mg by mouth daily.  03/02/18  Yes [name], MD  gabapentin (NEURONTIN) 300 MG capsule TAKE '300MG'$  IN THE MORNING, '300MG'$  IN THE AFTERNOON, AND '600MG'$  AT BEDTIME. Patient taking differently: Take 300-600 mg by mouth See admin instructions. Take '300mg'$  in the morning, '300mg'$  in the afternoon, and '600mg'$  at bedtime. 12/10/16  Yes Patel, Donika  K, DO  Glucosamine-Chondroit-Vit C-Mn (GLUCOSAMINE CHONDR 1500 COMPLX PO) Take 1 capsule by mouth daily.    Yes [provider]  HYDROcodone-acetaminophen (NORCO/VICODIN) 5-325 MG tablet Take 1 tablet by mouth every 4 (four) hours as needed for severe pain. 02/01/18  Yes [name], MD  levothyroxine (SYNTHROID, LEVOTHROID) 88 MCG tablet TAKE 1 TABLET (88 MCG TOTAL) BY MOUTH DAILY BEFORE BREAKFAST. Patient taking differently: Take 88 mcg by mouth at bedtime. Per pt takes at 10pm 11/03/17  Yes Leamon Arnt, MD  meloxicam (MOBIC) 7.5 MG tablet Take 7.5 mg by mouth 2 (two) times daily as needed for pain.   Yes [provider]  methocarbamol (ROBAXIN) 500 MG tablet TAKE 1 TABLET (500 MG TOTAL) BY MOUTH EVERY 6 (SIX) HOURS AS NEEDED FOR MUSCLE SPASMS. 03/31/18  Yes [name], MD  Multiple Vitamin (MULTIVITAMIN) tablet Take 1 tablet by mouth daily.     Yes [provider]  omeprazole (PRILOSEC) 20 MG capsule TAKE 1 CAPSULE BY MOUTH 2 TIMES DAILY BEFORE A MEAL. Patient taking differently: Take 20 mg by mouth 2 (two) times daily before a meal. TAKE 1 CAPSULE BY MOUTH 2 TIMES DAILY BEFORE A MEAL. 11/03/17  Yes Leamon Arnt, MD  spironolactone (ALDACTONE) 25 MG tablet Take 12.5 mg by mouth every morning.  07/12/14  Yes [provider]  doxycycline (VIBRA-TABS) 100 MG tablet Take 1 tablet (100 mg total) by mouth 2 (two) times daily. Patient not taking: Reported on 04/09/2018 02/22/18   [name], MD  metoprolol succinate (TOPROL XL) 25 MG 24 hr tablet Take 0.5 tablets (12.5 mg total) by mouth daily. 04/07/18   Lelon Perla, MD    Current Facility-Administered Medications  Medication Dose Route Frequency Provider Last Rate Last Dose  . 0.9 %  sodium chloride infusion (Manually program via Guardrails IV Fluids)   Intravenous Once Laurence Slate M, PA-C      . 0.9 %  sodium chloride infusion (Manually program via Guardrails IV  Fluids)   Intravenous Once Theda Sers, Emma M, PA-C      . 0.9 %  sodium chloride infusion (Manually program via Guardrails IV Fluids)   Intravenous Once Lovey Newcomer T, NP      . 0.9 %  sodium chloride infusion  500 mL Intravenous Once PRN Laurence Slate M, PA-C      . acetaminophen (TYLENOL) tablet 650 mg  650 mg Oral Q6H PRN Ulyses Amor, PA-C  650 mg at 04/10/18 0010   Or  . acetaminophen (TYLENOL) suppository 650 mg  650 mg Rectal Q6H PRN Ulyses Amor, PA-C      . alum & mag hydroxide-simeth (MAALOX/MYLANTA) 200-200-20 MG/5ML suspension 15-30 mL  15-30 mL Oral Q2H PRN Ulyses Amor, PA-C   30 mL at 04/13/18 0205  . bisacodyl (DULCOLAX) EC tablet 5 mg  5 mg Oral Daily PRN Ulyses Amor, PA-C   5 mg at 04/12/18 1339  . calcium-vitamin D (OSCAL WITH D) 500-200 MG-UNIT per tablet 1 tablet  1 tablet Oral Q breakfast Ulyses Amor, [place]   1 tablet at 04/13/18 0745  . docusate sodium (COLACE) capsule 100 mg  100 mg Oral BID Laurence Slate M, PA-C   100 mg at 04/12/18 2148  . enoxaparin (LOVENOX) injection 30 mg  30 mg Subcutaneous Q24H Laurence Slate M, PA-C   30 mg at 04/13/18 0960  . gabapentin (NEURONTIN) capsule 300 mg  300 mg Oral BID WC Laurence Slate M, PA-C   300 mg at 04/13/18 1217  . gabapentin (NEURONTIN) capsule 600 mg  600 mg Oral QHS Laurence Slate M, PA-C   600 mg at 04/12/18 2147  . guaiFENesin-dextromethorphan (ROBITUSSIN DM) 100-10 MG/5ML syrup 15 mL  15 mL Oral Q4H PRN Laurence Slate M, PA-C      . hydrALAZINE (APRESOLINE) injection 5 mg  5 mg Intravenous Q4H PRN Ulyses Amor, PA-C      . HYDROcodone-acetaminophen (NORCO/VICODIN) 5-325 MG per tablet 1 tablet  1 tablet Oral Q4H PRN Ulyses Amor, PA-C   1 tablet at 04/13/18 [phone]  . HYDROmorphone (DILAUDID) injection 0.5-1 mg  0.5-1 mg Intravenous Q2H PRN Laurence Slate M, PA-C   1 mg at 04/10/18 1622  . labetalol (NORMODYNE,TRANDATE) injection 10 mg  10 mg Intravenous Q10 min PRN Laurence Slate M, PA-C      . levothyroxine  (SYNTHROID, LEVOTHROID) tablet 88 mcg  88 mcg Oral QHS Laurence Slate M, [place]   88 mcg at 04/12/18 2148  . magnesium sulfate IVPB 2 g 50 mL  2 g Intravenous Daily PRN Laurence Slate M, PA-C      . metoprolol succinate (TOPROL-XL) 24 hr tablet 12.5 mg  12.5 mg Oral Daily Laurence Slate M, PA-C   12.5 mg at 04/13/18 9811  . metoprolol tartrate (LOPRESSOR) injection 2-5 mg  2-5 mg Intravenous Q2H PRN Laurence Slate M, PA-C      . multivitamin with minerals tablet 1 tablet  1 tablet Oral Daily Ulyses Amor, PA-C   1 tablet at 04/13/18 9147  . ondansetron (ZOFRAN) injection 4 mg  4 mg Intravenous Q6H PRN Oswald Hillock, MD   4 mg at 04/10/18 1536  . oxyCODONE (Oxy IR/ROXICODONE) immediate release tablet 5-10 mg  5-10 mg Oral Q4H PRN Laurence Slate M, PA-C      . pantoprazole (PROTONIX) EC tablet 40 mg  40 mg Oral Daily Laurence Slate M, PA-C   40 mg at 04/13/18 0744  . phenol (CHLORASEPTIC) mouth spray 1 spray  1 spray Mouth/Throat PRN Laurence Slate M, PA-C      . polyethylene glycol (MIRALAX / GLYCOLAX) packet 17 g  17 g Oral BID Oswald Hillock, MD      . senna-docusate (Senokot-S) tablet 1 tablet  1 tablet Oral QHS PRN Ulyses Amor, PA-C        Allergies as of 04/09/2018 - Review Complete 04/09/2018  Allergen Reaction Noted  . Amlodipine  Rash 08/31/2015  . Prochlorperazine edisylate Anaphylaxis 11/29/2007  . Aspirin Other (See Comments) 10/01/2009  . Cymbalta [duloxetine hcl] Diarrhea, Nausea And Vomiting, and Other (See Comments) 11/18/2017  . Pamelor [nortriptyline hcl] Diarrhea and Nausea Only 09/25/2016    Family History  Problem Relation Age of Onset  . Coronary artery disease Father   . Colon cancer Father   . Diabetes Father   . Cancer Father        colon  . Other Mother 47       MVA  . Healthy Sister   . Other Brother        Killed on war  . Healthy Daughter   . Esophageal cancer Neg Hx   . Kidney disease Neg Hx   . Liver disease Neg Hx     Social History   Socioeconomic  History  . Marital status: Widowed    Spouse name: Not on file  . Number of children: 2  . Years of education: Not on file  . Highest education level: Not on file  Occupational History  . Occupation: n/a  Social Needs  . Financial resource strain: Not on file  . Food insecurity:    Worry: Not on file    Inability: Not on file  . Transportation needs:    Medical: Not on file    Non-medical: Not on file  Tobacco Use  . Smoking status: Never Smoker  . Smokeless tobacco: Never Used  Substance and Sexual Activity  . Alcohol use: Not Currently    Frequency: Never  . Drug use: Never  . Sexual activity: Not Currently    Birth control/protection: Surgical  Lifestyle  . Physical activity:    Days per week: Not on file    Minutes per session: Not on file  . Stress: Not on file  Relationships  . Social connections:    Talks on phone: Not on file    Gets together: Not on file    Attends religious service: Not on file    Active member of club or organization: Not on file    Attends meetings of clubs or organizations: Not on file    Relationship status: Not on file  . Intimate partner violence:    Fear of current or ex partner: Not on file    Emotionally abused: Not on file    Physically abused: Not on file    Forced sexual activity: Not on file  Other Topics Concern  . Not on file  Social History Narrative   Lives with husband, daughter and grandchild local.   Highest level of education:  masters in education admin and technology    Review of Systems: All systems reviewed and negative except where noted in HPI.  Physical Exam: Vital signs in last 24 hours: Temp:  [98.3 F (36.8 C)-99.2 F (37.3 C)] 98.3 F (36.8 C) (02/25 0513) Pulse Rate:  [73-110] 94 (02/25 1100) Resp:  [12-27] 27 (02/25 1100) BP: (121-123)/(54-81) 121/62 (02/25 0948) SpO2:  [94 %-99 %] 99 % (02/25 1100) Last BM Date: 04/09/18 General:   Alert, well-developed, white female in NAD Psych:  Pleasant,  cooperative. Normal mood and affect. Eyes:  Pupils equal, sclera clear, no icterus.   Conjunctiva pink. Ears:  Normal auditory acuity. Nose:  No deformity, discharge,  or lesions. Neck:  Supple; no masses Lungs:  Clear throughout to auscultation.   No wheezes, crackles, or rhonchi.  Heart:  Regular rate and rhythm; no murmurs, RLE with distal swelling  and erythema. No drainage from the surgical wound on RLE. Abdomen:  Soft, mildly distended with tympany. BS active, no palp mass. Bruising to right groin Rectal:  Deferred  Msk:  Symmetrical without gross deformities. . Neurologic:  Alert and  oriented x4;  grossly normal neurologically. Skin:  Intact without significant lesions or rashes.   Intake/Output from previous day: 02/24 0701 - 02/25 0700 In: 1100 [Blood:1100] Out: 630 [Urine:630] Intake/Output this shift: Total I/O In: 240 [P.O.:240] Out: 300 [Urine:300]  Lab Results: Recent Labs    04/12/18 1955 04/13/18 0240 04/13/18 1241  WBC 8.6 8.3 7.2  HGB 9.8* 8.7* 8.6*  HCT 29.2* 26.6* 27.0*  PLT 166 172 187   BMET Recent Labs    04/11/18 0020 04/12/18 0335 04/13/18 0240  NA 135 133* 134*  K 5.3* 4.7 4.7  CL 105 105 105  CO2 '25 22 22  '$ GLUCOSE 125* 94 109*  BUN 21 37* 29*  CREATININE 1.30* 1.85* 1.12*  CALCIUM 8.1* 8.1* 8.4*   LFT Recent Labs    04/11/18 0020  PROT 4.8*  ALBUMIN 2.6*  AST 19  ALT 11  ALKPHOS 35*  BILITOT 0.7   PT/INR No results for input(s): LABPROT, INR in the last 72 hours. Hepatitis Panel No results for input(s): HEPBSAG, HCVAB, HEPAIGM, HEPBIGM in the last 72 hours.    Studies/Results: Ct Pelvis Wo Contrast  Result Date: 04/12/2018 CLINICAL DATA:  Right leg and groin swelling, common femoral artery pseudoaneurysm repair 2 days ago EXAM: CT PELVIS WITHOUT CONTRAST TECHNIQUE: Multidetector CT imaging of the pelvis was performed following the standard protocol without intravenous contrast. IV contrast could not be administered due to  low GFR. COMPARISON:  Ultrasound of 04/09/2018 FINDINGS: Urinary Tract:  Grossly unremarkable Bowel:  Grossly unremarkable Vascular/Lymphatic: Aortoiliac atherosclerotic vascular disease. The right external iliac vessels and common femoral vessels are obscured by streak artifact from the right hip implant. There is a surgical drain and running just lateral to the right common femoral artery, with some indistinctness of the margins of the common femoral vessels. Reproductive: Region obscured by streak artifact from the hip implants. Other:  No supplemental non-categorized findings. Musculoskeletal: There is a considerable amount of soft tissue prominence along the right iloipsoas and gluteal musculature as well as around the right hip joint especially anteriorly. This extends along the proximal vastus musculature, and a component of hemarthrosis is not excluded. There is a tiny locule of gas in the vicinity of the indistinct right rectus femoris muscle on image 41/10. There are small locules of gas along the right lower quadrant anterior abdominal wall musculature which appears thickened. I expect that much of this appearance is postoperative given the required retroperitoneal exposure of the right external iliac artery. There is potentially some hematoma along the right pelvic sidewall for example on image 31/10, partially obscured by the streak artifact from the hip hardware. Suspected right anterior acetabular stress fracture shown on images 111 through 99 of series 5. Known fracture of the right greater trochanter observed with posterior fragments. Prominent dextroconvex lumbar scoliosis with chronic bilateral pars defects at L5 and 10 mm of anterolisthesis of L5 on S1. Chronic superior endplate compression at L3. Left acetabular labral and pubic symphysis chondrocalcinosis. IMPRESSION: 1. Soft tissue density favoring hematoma primarily along the right anterior hip and groin region, particularly along the right  iloipsoas and anterior gluteal musculature as well as around the right hip joint and proximal vastus musculature. A component of hemarthrosis is a possibility. It  is difficult to know how much of this may be the result of the prior pseudoaneurysm that was repaired, versus continuing hematoma. I reviewed this case in person with Dr. Gae Gallop and we discussed the utility of performing a Doppler ultrasound to assess for defect or leak from the vessel previously repaired. Unfortunately, CT angiography is not feasible due to the patient's GFR, and the hardware from the hip implant obscures the surrounding soft tissues. 2. There is some postoperative findings in the region including a small amount of gas loculation and thickening/hematoma tracking along the right lower anterior abdominal wall musculature, not unexpected in the setting of prior retroperitoneal exposure of the right external iliac artery. 3. Suspected stress fracture of the right upper anterior acetabulum, best shown on the coronal multilobar matted images. There is also a subacute fracture of the greater trochanter which is a known finding based on prior imaging and operative notes. 4. A drain is present in the right groin region extending just lateral to the common femoral vasculature. 5. Prominent lumbar scoliosis. 6. Chronic pars defects at L5 with 10 mm anterolisthesis of L5 on S1. 7. Chronic superior endplate compression at L3 8. 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 Electronically Signed   By: Van Clines M.D.   On: 04/12/2018 07:53   Vas [place] Lower Extremity Arterial Duplex  Result Date: 04/12/2018 LOWER EXTREMITY ARTERIAL DUPLEX STUDY Indications: Post Pseudoaneurysm repair.  Current ABI: n/a Comparison Study: Prior study from 04/09/18 is available Performing Technologist: Sharion Dove RVS  Examination Guidelines: A complete evaluation includes B-mode imaging, spectral Doppler, color Doppler, and power Doppler  as needed of all accessible portions of each vessel. Bilateral testing is considered an integral part of a complete examination. Limited examinations for reoccurring indications may be performed as noted.  Summary: Right: No obvious evidence of pseudoaneurysm noted.  See table(s) above for measurements and observations. Electronically signed by Ruta Hinds MD on 04/12/2018 at 3:33:30 PM.    Final      Tye Savoy, NP-C @  04/13/2018, 2:44 PM

## 2018-04-13 NOTE — Consult Note (Addendum)
Referring Provider: [facility]   Primary Care Physician:  Darreld Mclean, MD   Primary Gastroenterologist: none. Was supposed to be seen by one of the [facility] in [facility] but hospitalized in interim.      Reason for Consultation:  anemia    ASSESSMENT / PLAN:    57. 85 yo female with [place] anemia, dark, heme + stools requiring repeated transfusions.She has taken Meloxicam for years. Rule out PUD. Had hip surgery in December complicated by post-op anemia requiring several units of blood. Hgb stabilized ~ 10 but since then has been declining and she reports dark stools since the surgery. Hgb has suboptimally responded to more PRBCs this admission.  She did undergo vascular surgery three days ago but blood loss presumably minimal.  -She is getting daily Lovenox, will she need anticoagulation upon discharge for right popliteal emboli?  -Given dark, heme + stool if is reasonable to proceed with EGD this admission. The risks and benefits of EGD were discussed and the patient agrees to proceed. Will see if procedure can be done tomorrow -continue daily PPI -trend hgb -Holding am Lovenox dose. Will make recommendations about timing of restart following EGD tomorrow.   2. Constipation. Last BM on Friday. Got Dulcolax today and starting BID Miralax this evening.    HPI:      HPI: [name] is a 85 y.o. female with hx of Stage IIA non-small cell lung cancer diagnosed 2015. She is s/p right lung resection but declined adjuvant systemic chemotherapy. No disease recurrence on chest CT scan early Jan 2020. Patient is s/p right hip repair 4 years ago. She has since fallen at home requiring revision of the hip. [place] again in Nov 2019 and dislocated her hip. Had another hip revieion in December 2019 and had problems with right groin swelling since. Imaging showed large right femoral pseudoaneurysm. She has been having right calf pain and imaging also showed abrupt occulusion of  popliteal artery. She is now s/p repair of the pseudoaneurysm on 04/10/18. On anticoagulation for right popliteal artery emboli .   Patient has chronic anemia with baseline hgb in low 10 range. Hgb declined ~ 3 units following hip surgery in December. Following blood transfusion hgb rose to 10. Follow up labs with PCP early January showed decline in hgb to 8.   No overt bleeding, PCP ordered hemoccults which were positive. Hgb improved over following couple of weeks (without transfusion) and was 10 on 03/12/18. The next time it was checked was when patient came to ED with leg pain on 04/09/18. At that time he was 8.8, declined further to 7.6. She was transfused 3 units of pRBC on 04/10/18 and hgb rose to 8.6. Yesterday morning it was down to 6.3, transfused another 2 units with bump in hgb to 9.8 yesterday. This am she is down a gram to 8.6.   Patient has been getting daily Lovenox injections. No overt GI bleeding but FOBT+.     Past Medical History:  Diagnosis Date  . Arthralgia of multiple joints    followed by dr Gerilyn Nestle  . Arthritis   . Cardiomyopathy ([place])   . Chronic constipation   . Chronic inflammatory arthritis    rhemotolgist-  dr a. Gerilyn Nestle [facility] [place])  . Dry eyes    eye drops used   . GERD (gastroesophageal reflux disease)   . H/O discoid lupus erythematosus   . Hiatal hernia   . History of colon polyps   .  Hypothyroidism   . Iron deficiency anemia   . LBBB (left bundle branch block) 2010  . Mitchell's disease (erythromelalgia) [facility])    neurologist-  dr patel  . Nocturia   . Non-small cell cancer of right lung [facility]) surgeon-- dr gerhardt/  oncologist-  dr Julien Nordmann--- per lov notes no recurrence/   11-18-2017 per pt denies any symptoms   dx 2015--  Stage IIA (T2b,N0,M0) , +EGFR  mutation in exon 21, non-small cell adenocarcinoma right upper lobe---  s/p  Right upper lobectomy , right middley wedge resection and node dissection---  no chemo or radiation therapy  . OA  (osteoarthritis)    hands  . Osteoporosis   . PONV (postoperative nausea and vomiting)    likes phenergan  . Raynaud's phenomenon 1965  . Renal insufficiency   . Rheumatoid arthritis ([place])   . Sciatica   . Scoliosis   . Sjogren's syndrome [facility])     Past Surgical History:  Procedure Laterality Date  . ANTERIOR HIP REVISION Right 11/27/2017   Procedure: RIGHT HIP ACETABULAR REVISION;  Surgeon: [name], MD;  Location: [facility];  Service: Orthopedics;  Laterality: Right;  . ANTERIOR HIP REVISION Right 01/24/2018   Procedure: OPEN REDUCTION OF DISLOCATED ANTERIOR HIP WITH REVISION OF LINER AND HIP BALL;  Surgeon: [name], MD;  Location: [facility];  Service: Orthopedics;  Laterality: Right;  . APPENDECTOMY  1950s  . CARDIOVASCULAR STRESS TEST  12/2008    mild fixed basal to mid septal perfusion defect felt likely due to artifact from LBBB, no ischemia, EF 58%  . COLONOSCOPY    . FEMORAL-POPLITEAL BYPASS GRAFT Right 04/10/2018   Procedure: REPAIR RIGHT FEMORAL ARTERY PSEUDOANEURYSM, RETROPERITONEAL EXPOSURE OF ILIAC ARTERY, RIGHT POPLITEAL EMBOLECTOMY;  Surgeon: Angelia Mould, MD;  Location: [place];  Service: Vascular;  Laterality: Right;  . LYMPH NODE DISSECTION Right 06/07/2013   Procedure: LYMPH NODE DISSECTION;  Surgeon: [name], MD;  Location: [place];  Service: Thoracic;  Laterality: Right;  . PATCH ANGIOPLASTY Right 04/10/2018   Procedure: PATCH  ANGIOPLASTY OF RIGHT FEMORAL ARTERY USING BOVINE PATCH, PATCH ANGIOPLASTY OF RIGHT POPLITEAL ARTERY USING BOVINE PATCH;  Surgeon: Angelia Mould, MD;  Location: [place];  Service: Vascular;  Laterality: Right;  . [place]   "large incision from chest to up to shoulder, the nerves were tied together, for raynaud's  . THORACOTOMY  06/07/2013   Procedure: MINI/LIMITED THORACOTOMY; right middle lobe wedge resection;  Surgeon: [name], MD;  Location: [place];  Service:  Thoracic;;  . TONSILLECTOMY  child  . TOTAL ABDOMINAL HYSTERECTOMY  1980's    W/ BSO  . TOTAL HIP ARTHROPLASTY Right 04/28/2014   Procedure: RIGHT TOTAL HIP ARTHROPLASTY ANTERIOR APPROACH;  Surgeon: [name], MD;  [facility];  Service: Orthopedics;  Laterality: Right;  . TRANSTHORACIC ECHOCARDIOGRAM  12/11/2008   ef 58-85%, grade 1 diastolic dysfunction/  mild LAE/  mild AR and MR/  trivial TR  . VIDEO ASSISTED THORACOSCOPY (VATS)/WEDGE RESECTION Right 06/07/2013   Procedure: VIDEO ASSISTED THORACOSCOPY (VATS)/right upper lobectomy, On Q;  Surgeon: [name], MD;  Location: [place];  Service: Thoracic;  Laterality: Right;  Marland Kitchen VIDEO BRONCHOSCOPY N/A 06/07/2013   Procedure: VIDEO BRONCHOSCOPY;  Surgeon: [name], MD;  Location: [place];  Service: Thoracic;  Laterality: N/A;  . VIDEO BRONCHOSCOPY WITH ENDOBRONCHIAL NAVIGATION N/A 05/04/2013   Procedure: VIDEO BRONCHOSCOPY WITH ENDOBRONCHIAL NAVIGATION;  Surgeon: [name],  MD;  [facility];  Service: Thoracic;  Laterality: N/A;    Prior to Admission medications   Medication Sig Start Date End Date Taking? Authorizing Provider  Biotin 1000 MCG tablet Take 1,000 mcg by mouth daily.    Yes [provider]  Calcium Carbonate-Vitamin D (CALCIUM 500 + D) 500-125 MG-UNIT TABS Take 1 tablet by mouth daily.    Yes [provider]  docusate sodium (STOOL SOFTENER) 100 MG capsule Take 100 mg by mouth 2 (two) times daily.     Yes [provider]  furosemide (LASIX) 20 MG tablet TAKE 1 TABLET BY MOUTH EVERY DAY Patient taking differently: Take 20 mg by mouth daily.  03/02/18  Yes [name], MD  gabapentin (NEURONTIN) 300 MG capsule TAKE '300MG'$  IN THE MORNING, '300MG'$  IN THE AFTERNOON, AND '600MG'$  AT BEDTIME. Patient taking differently: Take 300-600 mg by mouth See admin instructions. Take '300mg'$  in the morning, '300mg'$  in the afternoon, and '600mg'$  at bedtime. 12/10/16  Yes Patel, Donika  K, DO  Glucosamine-Chondroit-Vit C-Mn (GLUCOSAMINE CHONDR 1500 COMPLX PO) Take 1 capsule by mouth daily.    Yes [provider]  HYDROcodone-acetaminophen (NORCO/VICODIN) 5-325 MG tablet Take 1 tablet by mouth every 4 (four) hours as needed for severe pain. 02/01/18  Yes [name], MD  levothyroxine (SYNTHROID, LEVOTHROID) 88 MCG tablet TAKE 1 TABLET (88 MCG TOTAL) BY MOUTH DAILY BEFORE BREAKFAST. Patient taking differently: Take 88 mcg by mouth at bedtime. Per pt takes at 10pm 11/03/17  Yes Leamon Arnt, MD  meloxicam (MOBIC) 7.5 MG tablet Take 7.5 mg by mouth 2 (two) times daily as needed for pain.   Yes [provider]  methocarbamol (ROBAXIN) 500 MG tablet TAKE 1 TABLET (500 MG TOTAL) BY MOUTH EVERY 6 (SIX) HOURS AS NEEDED FOR MUSCLE SPASMS. 03/31/18  Yes [name], MD  Multiple Vitamin (MULTIVITAMIN) tablet Take 1 tablet by mouth daily.     Yes [provider]  omeprazole (PRILOSEC) 20 MG capsule TAKE 1 CAPSULE BY MOUTH 2 TIMES DAILY BEFORE A MEAL. Patient taking differently: Take 20 mg by mouth 2 (two) times daily before a meal. TAKE 1 CAPSULE BY MOUTH 2 TIMES DAILY BEFORE A MEAL. 11/03/17  Yes Leamon Arnt, MD  spironolactone (ALDACTONE) 25 MG tablet Take 12.5 mg by mouth every morning.  07/12/14  Yes [provider]  doxycycline (VIBRA-TABS) 100 MG tablet Take 1 tablet (100 mg total) by mouth 2 (two) times daily. Patient not taking: Reported on 04/09/2018 02/22/18   [name], MD  metoprolol succinate (TOPROL XL) 25 MG 24 hr tablet Take 0.5 tablets (12.5 mg total) by mouth daily. 04/07/18   Lelon Perla, MD    Current Facility-Administered Medications  Medication Dose Route Frequency Provider Last Rate Last Dose  . 0.9 %  sodium chloride infusion (Manually program via Guardrails IV Fluids)   Intravenous Once Laurence Slate M, PA-C      . 0.9 %  sodium chloride infusion (Manually program via Guardrails IV  Fluids)   Intravenous Once Theda Sers, Emma M, PA-C      . 0.9 %  sodium chloride infusion (Manually program via Guardrails IV Fluids)   Intravenous Once Lovey Newcomer T, NP      . 0.9 %  sodium chloride infusion  500 mL Intravenous Once PRN Laurence Slate M, PA-C      . acetaminophen (TYLENOL) tablet 650 mg  650 mg Oral Q6H PRN Ulyses Amor, PA-C  650 mg at 04/10/18 0010   Or  . acetaminophen (TYLENOL) suppository 650 mg  650 mg Rectal Q6H PRN Ulyses Amor, PA-C      . alum & mag hydroxide-simeth (MAALOX/MYLANTA) 200-200-20 MG/5ML suspension 15-30 mL  15-30 mL Oral Q2H PRN Ulyses Amor, PA-C   30 mL at 04/13/18 0205  . bisacodyl (DULCOLAX) EC tablet 5 mg  5 mg Oral Daily PRN Ulyses Amor, PA-C   5 mg at 04/12/18 1339  . calcium-vitamin D (OSCAL WITH D) 500-200 MG-UNIT per tablet 1 tablet  1 tablet Oral Q breakfast Ulyses Amor, [place]   1 tablet at 04/13/18 0745  . docusate sodium (COLACE) capsule 100 mg  100 mg Oral BID Laurence Slate M, PA-C   100 mg at 04/12/18 2148  . enoxaparin (LOVENOX) injection 30 mg  30 mg Subcutaneous Q24H Laurence Slate M, PA-C   30 mg at 04/13/18 5093  . gabapentin (NEURONTIN) capsule 300 mg  300 mg Oral BID WC Laurence Slate M, PA-C   300 mg at 04/13/18 1217  . gabapentin (NEURONTIN) capsule 600 mg  600 mg Oral QHS Laurence Slate M, PA-C   600 mg at 04/12/18 2147  . guaiFENesin-dextromethorphan (ROBITUSSIN DM) 100-10 MG/5ML syrup 15 mL  15 mL Oral Q4H PRN Laurence Slate M, PA-C      . hydrALAZINE (APRESOLINE) injection 5 mg  5 mg Intravenous Q4H PRN Ulyses Amor, PA-C      . HYDROcodone-acetaminophen (NORCO/VICODIN) 5-325 MG per tablet 1 tablet  1 tablet Oral Q4H PRN Ulyses Amor, PA-C   1 tablet at 04/13/18 [id number]  . HYDROmorphone (DILAUDID) injection 0.5-1 mg  0.5-1 mg Intravenous Q2H PRN Laurence Slate M, PA-C   1 mg at 04/10/18 1622  . labetalol (NORMODYNE,TRANDATE) injection 10 mg  10 mg Intravenous Q10 min PRN Laurence Slate M, PA-C      . levothyroxine  (SYNTHROID, LEVOTHROID) tablet 88 mcg  88 mcg Oral QHS Laurence Slate M, [place]   88 mcg at 04/12/18 2148  . magnesium sulfate IVPB 2 g 50 mL  2 g Intravenous Daily PRN Laurence Slate M, PA-C      . metoprolol succinate (TOPROL-XL) 24 hr tablet 12.5 mg  12.5 mg Oral Daily Laurence Slate M, PA-C   12.5 mg at 04/13/18 2458  . metoprolol tartrate (LOPRESSOR) injection 2-5 mg  2-5 mg Intravenous Q2H PRN Laurence Slate M, PA-C      . multivitamin with minerals tablet 1 tablet  1 tablet Oral Daily Ulyses Amor, PA-C   1 tablet at 04/13/18 0998  . ondansetron (ZOFRAN) injection 4 mg  4 mg Intravenous Q6H PRN Oswald Hillock, MD   4 mg at 04/10/18 1536  . oxyCODONE (Oxy IR/ROXICODONE) immediate release tablet 5-10 mg  5-10 mg Oral Q4H PRN Laurence Slate M, PA-C      . pantoprazole (PROTONIX) EC tablet 40 mg  40 mg Oral Daily Laurence Slate M, PA-C   40 mg at 04/13/18 0744  . phenol (CHLORASEPTIC) mouth spray 1 spray  1 spray Mouth/Throat PRN Laurence Slate M, PA-C      . polyethylene glycol (MIRALAX / GLYCOLAX) packet 17 g  17 g Oral BID Oswald Hillock, MD      . senna-docusate (Senokot-S) tablet 1 tablet  1 tablet Oral QHS PRN Ulyses Amor, PA-C        Allergies as of 04/09/2018 - Review Complete 04/09/2018  Allergen Reaction Noted  . Amlodipine  Rash 08/31/2015  . Prochlorperazine edisylate Anaphylaxis 11/29/2007  . Aspirin Other (See Comments) 10/01/2009  . Cymbalta [duloxetine hcl] Diarrhea, Nausea And Vomiting, and Other (See Comments) 11/18/2017  . Pamelor [nortriptyline hcl] Diarrhea and Nausea Only 09/25/2016    Family History  Problem Relation Age of Onset  . Coronary artery disease Father   . Colon cancer Father   . Diabetes Father   . Cancer Father        colon  . Other Mother 70       MVA  . Healthy Sister   . Other Brother        Killed on war  . Healthy Daughter   . Esophageal cancer Neg Hx   . Kidney disease Neg Hx   . Liver disease Neg Hx     Social History   Socioeconomic  History  . Marital status: Widowed    Spouse name: Not on file  . Number of children: 2  . Years of education: Not on file  . Highest education level: Not on file  Occupational History  . Occupation: n/a  Social Needs  . Financial resource strain: Not on file  . Food insecurity:    Worry: Not on file    Inability: Not on file  . Transportation needs:    Medical: Not on file    Non-medical: Not on file  Tobacco Use  . Smoking status: Never Smoker  . Smokeless tobacco: Never Used  Substance and Sexual Activity  . Alcohol use: Not Currently    Frequency: Never  . Drug use: Never  . Sexual activity: Not Currently    Birth control/protection: Surgical  Lifestyle  . Physical activity:    Days per week: Not on file    Minutes per session: Not on file  . Stress: Not on file  Relationships  . Social connections:    Talks on phone: Not on file    Gets together: Not on file    Attends religious service: Not on file    Active member of club or organization: Not on file    Attends meetings of clubs or organizations: Not on file    Relationship status: Not on file  . Intimate partner violence:    Fear of current or ex partner: Not on file    Emotionally abused: Not on file    Physically abused: Not on file    Forced sexual activity: Not on file  Other Topics Concern  . Not on file  Social History Narrative   Lives with husband, daughter and grandchild local.   Highest level of education:  masters in education admin and technology    Review of Systems: All systems reviewed and negative except where noted in HPI.  Physical Exam: Vital signs in last 24 hours: Temp:  [98.3 F (36.8 C)-99.2 F (37.3 C)] 98.3 F (36.8 C) (02/25 0513) Pulse Rate:  [73-110] 94 (02/25 1100) Resp:  [12-27] 27 (02/25 1100) BP: (121-123)/(54-81) 121/62 (02/25 0948) SpO2:  [94 %-99 %] 99 % (02/25 1100) Last BM Date: 04/09/18 General:   Alert, well-developed, white female in NAD Psych:  Pleasant,  cooperative. Normal mood and affect. Eyes:  Pupils equal, sclera clear, no icterus.   Conjunctiva pink. Ears:  Normal auditory acuity. Nose:  No deformity, discharge,  or lesions. Neck:  Supple; no masses Lungs:  Clear throughout to auscultation.   No wheezes, crackles, or rhonchi.  Heart:  Regular rate and rhythm; no murmurs, RLE with distal swelling  and erythema. No drainage from the surgical wound on RLE. Abdomen:  Soft, mildly distended with tympany. BS active, no palp mass. Bruising to right groin Rectal:  Deferred  Msk:  Symmetrical without gross deformities. . Neurologic:  Alert and  oriented x4;  grossly normal neurologically. Skin:  Intact without significant lesions or rashes.   Intake/Output from previous day: 02/24 0701 - 02/25 0700 In: 1100 [Blood:1100] Out: 630 [Urine:630] Intake/Output this shift: Total I/O In: 240 [P.O.:240] Out: 300 [Urine:300]  Lab Results: Recent Labs    04/12/18 1955 04/13/18 0240 04/13/18 1241  WBC 8.6 8.3 7.2  HGB 9.8* 8.7* 8.6*  HCT 29.2* 26.6* 27.0*  PLT 166 172 187   BMET Recent Labs    04/11/18 0020 04/12/18 0335 04/13/18 0240  NA 135 133* 134*  K 5.3* 4.7 4.7  CL 105 105 105  CO2 '25 22 22  '$ GLUCOSE 125* 94 109*  BUN 21 37* 29*  CREATININE 1.30* 1.85* 1.12*  CALCIUM 8.1* 8.1* 8.4*   LFT Recent Labs    04/11/18 0020  PROT 4.8*  ALBUMIN 2.6*  AST 19  ALT 11  ALKPHOS 35*  BILITOT 0.7   PT/INR No results for input(s): LABPROT, INR in the last 72 hours. Hepatitis Panel No results for input(s): HEPBSAG, HCVAB, HEPAIGM, HEPBIGM in the last 72 hours.    Studies/Results: Ct Pelvis Wo Contrast  Result Date: 04/12/2018 CLINICAL DATA:  Right leg and groin swelling, common femoral artery pseudoaneurysm repair 2 days ago EXAM: CT PELVIS WITHOUT CONTRAST TECHNIQUE: Multidetector CT imaging of the pelvis was performed following the standard protocol without intravenous contrast. IV contrast could not be administered due to  low GFR. COMPARISON:  Ultrasound of 04/09/2018 FINDINGS: Urinary Tract:  Grossly unremarkable Bowel:  Grossly unremarkable Vascular/Lymphatic: Aortoiliac atherosclerotic vascular disease. The right external iliac vessels and common femoral vessels are obscured by streak artifact from the right hip implant. There is a surgical drain and running just lateral to the right common femoral artery, with some indistinctness of the margins of the common femoral vessels. Reproductive: Region obscured by streak artifact from the hip implants. Other:  No supplemental non-categorized findings. Musculoskeletal: There is a considerable amount of soft tissue prominence along the right iloipsoas and gluteal musculature as well as around the right hip joint especially anteriorly. This extends along the proximal vastus musculature, and a component of hemarthrosis is not excluded. There is a tiny locule of gas in the vicinity of the indistinct right rectus femoris muscle on image 41/10. There are small locules of gas along the right lower quadrant anterior abdominal wall musculature which appears thickened. I expect that much of this appearance is postoperative given the required retroperitoneal exposure of the right external iliac artery. There is potentially some hematoma along the right pelvic sidewall for example on image 31/10, partially obscured by the streak artifact from the hip hardware. Suspected right anterior acetabular stress fracture shown on images 111 through 99 of series 5. Known fracture of the right greater trochanter observed with posterior fragments. Prominent dextroconvex lumbar scoliosis with chronic bilateral pars defects at L5 and 10 mm of anterolisthesis of L5 on S1. Chronic superior endplate compression at L3. Left acetabular labral and pubic symphysis chondrocalcinosis. IMPRESSION: 1. Soft tissue density favoring hematoma primarily along the right anterior hip and groin region, particularly along the right  iloipsoas and anterior gluteal musculature as well as around the right hip joint and proximal vastus musculature. A component of hemarthrosis is a possibility. It  is difficult to know how much of this may be the result of the prior pseudoaneurysm that was repaired, versus continuing hematoma. I reviewed this case in person with Dr. Gae Gallop and we discussed the utility of performing a Doppler ultrasound to assess for defect or leak from the vessel previously repaired. Unfortunately, CT angiography is not feasible due to the patient's GFR, and the hardware from the hip implant obscures the surrounding soft tissues. 2. There is some postoperative findings in the region including a small amount of gas loculation and thickening/hematoma tracking along the right lower anterior abdominal wall musculature, not unexpected in the setting of prior retroperitoneal exposure of the right external iliac artery. 3. Suspected stress fracture of the right upper anterior acetabulum, best shown on the coronal multilobar matted images. There is also a subacute fracture of the greater trochanter which is a known finding based on prior imaging and operative notes. 4. A drain is present in the right groin region extending just lateral to the common femoral vasculature. 5. Prominent lumbar scoliosis. 6. Chronic pars defects at L5 with 10 mm anterolisthesis of L5 on S1. 7. Chronic superior endplate compression at L3 8. 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 Electronically Signed   By: Van Clines M.D.   On: 04/12/2018 07:53   Vas [place] Lower Extremity Arterial Duplex  Result Date: 04/12/2018 LOWER EXTREMITY ARTERIAL DUPLEX STUDY Indications: Post Pseudoaneurysm repair.  Current ABI: n/a Comparison Study: Prior study from 04/09/18 is available Performing Technologist: Sharion Dove RVS  Examination Guidelines: A complete evaluation includes B-mode imaging, spectral Doppler, color Doppler, and power Doppler  as needed of all accessible portions of each vessel. Bilateral testing is considered an integral part of a complete examination. Limited examinations for reoccurring indications may be performed as noted.  Summary: Right: No obvious evidence of pseudoaneurysm noted.  See table(s) above for measurements and observations. Electronically signed by Ruta Hinds MD on 04/12/2018 at 3:33:30 PM.    Final      Tye Savoy, NP-C @  04/13/2018, 2:44 PM

## 2018-04-13 NOTE — Progress Notes (Signed)
Patient has been voiding in bathroom without issue. Patient has some swelling in peri area and does not want to use purewick. Patient states she would prefer to go to bathroom or bedside commode. Pt states she will void before going to bed and wear a depends from home for comfort. Pt has issue overnight with holding urine. She had in/out catheter per PM RN and 600 ml documented. Pt resting with call bell within reach.  Will continue to monitor.

## 2018-04-13 NOTE — Progress Notes (Addendum)
[facility]   VASCULAR SURGERY ASSESSMENT & PLAN:   POD 3: STATUS POST REPAIR OF RIGHT FEMORAL ARTERY PSEUDOANEURYSM AND RIGHT POPLITEAL AND TIBIAL EMBOLECTOMY: Her incisions are healing well.  She has a palpable pedal pulse.  Her drain is out.  Okay to mobilize from my standpoint.  Her CT scan and duplex scan yesterday showed no evidence of bleeding in the right groin.  ANEMIA: Not sure why her hemoglobin has drifted down.  She apparently has had some black stools and a GI consult is being considered.   [name], MD, FACS Beeper [phone] Office: [phone]     Subjective  - Doing ok this am.  Had to have in / out cath last night for urinary retention    Objective (!) 121/54 81 98.3 F (36.8 C) (Oral) 14 94%  Intake/Output Summary (Last 24 hours) at 04/13/2018 0731 Last data filed at 04/13/2018 0255 Gross per 24 hour  Intake 1100 ml  Output 630 ml  Net 470 ml    Palpable right DP Lower leg incision healing well with edema below the dressing mark.  Left open to air. Right groin firm lateral to incision, drain site clean and dry dressing changed.  Assessment/Planning: POD # 3 Post-Op s/p: Repair of right femoral artery pseudoaneurysm and popliteal and tibial embolectomy.  Duplex and CT show no active bleeding and no recurrent pseudoaneurysm.  Plan today mobility, independent voiding monitoring. HGB drop from 9.8 to 8.7 in last 24 hours after transfusion 2 additional units for a total of 4 units since admission.  Has known chronic anemia and hx of GI bleed.    [name] 04/13/2018 7:31 AM --  Laboratory Lab Results: Recent Labs    04/12/18 1955 04/13/18 0240  WBC 8.6 8.3  HGB 9.8* 8.7*  HCT 29.2* 26.6*  PLT 166 172   BMET Recent Labs    04/12/18 0335 04/13/18 0240  NA 133* 134*  K 4.7 4.7  CL 105 105  CO2 22 22  GLUCOSE 94 109*  BUN 37* 29*  CREATININE 1.85* 1.12*  CALCIUM 8.1* 8.4*    COAG Lab  Results  Component Value Date   INR 0.97 04/21/2014   INR 0.89 06/02/2013   INR 0.91 05/02/2013   No results found for: PTT

## 2018-04-13 NOTE — Progress Notes (Signed)
Physical Therapy Treatment Patient Details Name: [name] MRN: [id number] DOB: Sep 12, 1930 Today's Date: 04/13/2018    History of Present Illness 85 year old female with history of arthritis, GERD, hypothyroidism, LBBB who underwent right hip replacement 4 years ago, had revision of hip in October 2019 subsequently fell in November 2019 and had dislocation of the hip.  At that time attempts were made to relocate the hip twice until she subsequently underwent revision of right hip on 01/24/2018.  Since that time patient has swelling and pain in right groin.  She was seen by PCP at that time ultrasound showed large pseudoaneurysm.  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    PT Comments    Patient seen for activity progression. Tolerated ambulation, functional task performance and returned to bed. Patient continues to require some modest assist for mobility and reliance on RW for support. Current POC remains appropriate. Caregiver present at bedside and very supportive.    Follow Up Recommendations  Home health PT;Supervision/Assistance - 24 hour(if unable to arrange initial 24/7 may need SNF)     Equipment Recommendations  None recommended by PT    Recommendations for Other Services       Precautions / Restrictions Precautions Precautions: Fall Precaution Comments: JP drain removed 2/24 Restrictions Weight Bearing Restrictions: No    Mobility  Bed Mobility Overal bed mobility: Needs Assistance Bed Mobility: Sit to Supine     Supine to sit: Min assist Sit to supine: Min assist   General bed mobility comments: Assist to elevate LEs back to bed and reposition  Transfers Overall transfer level: Needs assistance Equipment used: Rolling walker (2 wheeled) Transfers: Sit  to/from Stand Sit to Stand: Min assist         General transfer comment: Min A to power up into standing from chair and from toilet  Ambulation/Gait Ambulation/Gait assistance: Min guard Gait Distance (Feet): 40 Feet Assistive device: Rolling walker (2 wheeled) Gait Pattern/deviations: Antalgic;Decreased stride length;Trunk flexed Gait velocity: decreased Gait velocity interpretation: <1.8 ft/sec, indicate of risk for recurrent falls General Gait Details: decreased   Stairs             Wheelchair Mobility    Modified Rankin (Stroke Patients Only)       Balance Overall balance assessment: Needs assistance Sitting-balance support: No upper extremity supported;Feet supported Sitting balance-Leahy Scale: Fair     Standing balance support: No upper extremity supported;During functional activity Standing balance-Leahy Scale: Poor Standing balance comment: Reliance on UE support via RW                            Cognition Arousal/Alertness: Awake/alert Behavior During Therapy: WFL for tasks assessed/performed Overall Cognitive Status: Within Functional Limits for tasks assessed                                        Exercises      General Comments General comments (skin integrity, edema, etc.): VSS througout. Pt's caregiver, Freda Jackson present      Pertinent Vitals/Pain Pain Assessment: Faces Faces Pain Scale: Hurts even more Pain Location: groin Pain Descriptors / Indicators: Sore Pain Intervention(s): Monitored during session  Home Living                      Prior Function            PT Goals (current goals can now be found in the care plan section) Acute Rehab PT Goals Patient Stated Goal: to go home with increased assist PT Goal Formulation: With patient Time For Goal Achievement: 04/25/18 Potential to Achieve Goals: Good Progress towards PT goals: Progressing toward goals    Frequency    Min  3X/week      PT Plan Current plan remains appropriate    Co-evaluation              AM-PAC PT "6 Clicks" Mobility   Outcome Measure  Help needed turning from your back to your side while in a flat bed without using bedrails?: A Little Help needed moving from lying on your back to sitting on the side of a flat bed without using bedrails?: A Little Help needed moving to and from a bed to a chair (including a wheelchair)?: A Little Help needed standing up from a chair using your arms (e.g., wheelchair or bedside chair)?: A Little Help needed to walk in hospital room?: A Little Help needed climbing 3-5 steps with a railing? : A Lot 6 Click Score: 17    End of Session Equipment Utilized During Treatment: Gait belt Activity Tolerance: Patient tolerated treatment well Patient left: in bed;with call bell/phone within reach Nurse Communication: Mobility status PT Visit Diagnosis: Unsteadiness on feet (R26.81);Difficulty in walking, not elsewhere classified (R26.2)     Time: 1572-6203 PT Time Calculation (min) (ACUTE ONLY): 21 min  Charges:  $Gait Training: 8-22 mins                     Alben Deeds, PT DPT  Board Certified Neurologic Specialist [place] Pager [phone] Office [phone]    Duncan Dull 04/13/2018, 2:05 PM

## 2018-04-13 NOTE — Progress Notes (Signed)
Occupational Therapy Treatment Patient Details Name: [name] MRN: [id number] DOB: 1930-10-08 Today's Date: 04/13/2018    History of present illness 85 year old female with history of arthritis, GERD, hypothyroidism, LBBB who underwent right hip replacement 4 years ago, had revision of hip in October 2019 subsequently fell in November 2019 and had dislocation of the hip.  At that time attempts were made to relocate the hip twice until she subsequently underwent revision of right hip on 01/24/2018.  Since that time patient has swelling and pain in right groin.  She was seen by PCP at that time ultrasound showed large pseudoaneurysm.  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   OT comments  Pt progressing towards established OT goals. Pt performing toileting with Min A for both transfer to [facility] and balance during peri care. Pt performing hand hygiene at sink with Min Guard A. Continue to recommend dc to home with HHOT and will continue to follow acutely as admitted.    Follow Up Recommendations  Home health OT;Supervision/Assistance - 24 hour    Equipment Recommendations  None recommended by OT    Recommendations for Other Services      Precautions / Restrictions Precautions Precautions: Fall Precaution Comments: JP drain removed 2/24 Restrictions Weight Bearing Restrictions: No       Mobility Bed Mobility Overal bed mobility: Needs Assistance Bed Mobility: Supine to Sit;Sit to Supine     Supine to sit: Min assist     General bed mobility comments: Min A for elevating trunk  Transfers Overall transfer level: Needs assistance Equipment used: Rolling walker (2 wheeled) Transfers: Sit to/from Stand Sit to Stand: Min assist         General transfer comment: Min A to  power up into standing    Balance Overall balance assessment: Needs assistance Sitting-balance support: No upper extremity supported;Feet supported Sitting balance-Leahy Scale: Fair     Standing balance support: No upper extremity supported;During functional activity Standing balance-Leahy Scale: Poor Standing balance comment: Able to maintain static standing at sink, but reliant on UE with remainder of session                           ADL either performed or assessed with clinical judgement   ADL Overall ADL's : Needs assistance/impaired     Grooming: Wash/dry hands;Min guard;Standing Grooming Details (indicate cue type and reason): Min Guard A for safety while pt performed hand hygiene at sink             Lower Body Dressing: Maximal assistance;Sit to/from stand Lower Body Dressing Details (indicate cue type and reason): Max A to don socks Toilet Transfer: Ambulation;RW;BSC;Minimal assistance(over toilet) Toilet Transfer Details (indicate cue type and reason): Min A for safe descent and then Min A to power up into standing Toileting- Clothing Manipulation and Hygiene: Minimal assistance;Sit to/from stand Toileting - Clothing Manipulation Details (indicate cue type and reason): Min A for balance with posterior lean as pt performed peri care     Functional mobility during ADLs: Min guard;Rolling walker General ADL Comments: Pt continues to present with decreased strength and balance     Vision       Perception     Praxis      Cognition Arousal/Alertness: Awake/alert Behavior During  Therapy: WFL for tasks assessed/performed Overall Cognitive Status: Within Functional Limits for tasks assessed                                          Exercises     Shoulder Instructions       General Comments VSS througout. Pt's caregiver, Freda Jackson present    Pertinent Vitals/ Pain       Pain Assessment: Faces Faces Pain Scale: Hurts even  more Pain Location: groin Pain Descriptors / Indicators: Sore Pain Intervention(s): Monitored during session;Limited activity within patient's tolerance;Repositioned  Home Living                                          Prior Functioning/Environment              Frequency  Min 2X/week        Progress Toward Goals  OT Goals(current goals can now be found in the care plan section)  Progress towards OT goals: Progressing toward goals  Acute Rehab OT Goals Patient Stated Goal: to go home with increased assist OT Goal Formulation: With patient Time For Goal Achievement: 04/19/18 Potential to Achieve Goals: Good ADL Goals Pt Will Perform Grooming: with supervision;standing Pt Will Perform Lower Body Bathing: with supervision;with adaptive equipment;sit to/from stand Pt Will Perform Lower Body Dressing: with supervision;with adaptive equipment;sit to/from stand Pt Will Transfer to Toilet: with supervision;ambulating Pt Will Perform Toileting - Clothing Manipulation and hygiene: with supervision;sit to/from stand Additional ADL Goal #1: Pt will perform bed mobility modified independently in preparation for ADL.  Plan Discharge plan remains appropriate    Co-evaluation                 AM-PAC OT "6 Clicks" Daily Activity     Outcome Measure   Help from another person eating meals?: None Help from another person taking care of personal grooming?: A Little Help from another person toileting, which includes using toliet, bedpan, or urinal?: A Little Help from another person bathing (including washing, rinsing, drying)?: A Lot Help from another person to put on and taking off regular upper body clothing?: None Help from another person to put on and taking off regular lower body clothing?: A Lot 6 Click Score: 18    End of Session Equipment Utilized During Treatment: Gait belt;Rolling walker  OT Visit Diagnosis: Unsteadiness on feet (R26.81);Other  abnormalities of gait and mobility (R26.89);Pain;Muscle weakness (generalized) (M62.81) Pain - Right/Left: Right Pain - part of body: Leg   Activity Tolerance Patient tolerated treatment well   Patient Left with call bell/phone within reach;with nursing/sitter in room;in chair   Nurse Communication Mobility status;Other (comment)(Bloor present during peri care)        Time: 1012-1037 OT Time Calculation (min): 25 min  Charges: OT General Charges $OT Visit: 1 Visit OT Treatments $Self Care/Home Management : 23-37 mins  [place], OTR/L Acute Rehab Pager: [phone] Office: [place] 04/13/2018, 12:48 PM

## 2018-04-14 ENCOUNTER — Encounter (HOSPITAL_COMMUNITY): Payer: Self-pay | Admitting: Certified Registered"

## 2018-04-14 ENCOUNTER — Inpatient Hospital Stay (HOSPITAL_COMMUNITY): Payer: Medicare Other | Admitting: Anesthesiology

## 2018-04-14 ENCOUNTER — Encounter (HOSPITAL_COMMUNITY)
Admission: EM | Disposition: A | Discharge status: Home Health | Payer: Self-pay | Source: Home / Self Care | Attending: Family Medicine

## 2018-04-14 DIAGNOSIS — K449 Diaphragmatic hernia without obstruction or gangrene: Secondary | ICD-10-CM

## 2018-04-14 DIAGNOSIS — E7800 Pure hypercholesterolemia, unspecified: Secondary | ICD-10-CM

## 2018-04-14 DIAGNOSIS — K3189 Other diseases of stomach and duodenum: Secondary | ICD-10-CM

## 2018-04-14 HISTORY — PX: BIOPSY: SHX5522

## 2018-04-14 HISTORY — PX: ESOPHAGOGASTRODUODENOSCOPY (EGD) WITH PROPOFOL: SHX5813

## 2018-04-14 LAB — CBC
HCT: 22.9 % — ABNORMAL LOW (ref 36.0–46.0)
Hemoglobin: 7.2 g/dL — ABNORMAL LOW (ref 12.0–15.0)
MCH: 28.8 pg (ref 26.0–34.0)
MCHC: 31.4 g/dL (ref 30.0–36.0)
MCV: 91.6 fL (ref 80.0–100.0)
Platelets: 208 10*3/uL (ref 150–400)
RBC: 2.5 MIL/uL — ABNORMAL LOW (ref 3.87–5.11)
RDW: 15.8 % — ABNORMAL HIGH (ref 11.5–15.5)
WBC: 6.7 10*3/uL (ref 4.0–10.5)
nRBC: 0 % (ref 0.0–0.2)

## 2018-04-14 LAB — PREPARE RBC (CROSSMATCH)

## 2018-04-14 LAB — BASIC METABOLIC PANEL
Anion gap: 5 (ref 5–15)
BUN: 23 mg/dL (ref 8–23)
CHLORIDE: 104 mmol/L (ref 98–111)
CO2: 26 mmol/L (ref 22–32)
Calcium: 8.3 mg/dL — ABNORMAL LOW (ref 8.9–10.3)
Creatinine, Ser: 1.1 mg/dL — ABNORMAL HIGH (ref 0.44–1.00)
GFR calc Af Amer: 53 mL/min — ABNORMAL LOW (ref >60)
GFR calc non Af Amer: 46 mL/min — ABNORMAL LOW (ref >60)
Glucose, Bld: 133 mg/dL — ABNORMAL HIGH (ref 70–99)
Potassium: 4.9 mmol/L (ref 3.5–5.1)
Sodium: 135 mmol/L (ref 135–145)

## 2018-04-14 LAB — MAGNESIUM: MAGNESIUM: 2.1 mg/dL (ref 1.7–2.4)

## 2018-04-14 LAB — PHOSPHORUS: Phosphorus: 2.6 mg/dL (ref 2.5–4.6)

## 2018-04-14 SURGERY — ESOPHAGOGASTRODUODENOSCOPY (EGD) WITH PROPOFOL
Anesthesia: Monitor Anesthesia Care

## 2018-04-14 MED ORDER — PROPOFOL 500 MG/50ML IV EMUL
INTRAVENOUS | Status: DC | PRN
  Administered 2018-04-14: 100 ug/kg/min via INTRAVENOUS

## 2018-04-14 MED ORDER — PEG-KCL-NACL-NASULF-NA ASC-C 100 G PO SOLR
1.0000 | Freq: Once | ORAL | Status: AC
  Administered 2018-04-14: 200 g via ORAL
  Filled 2018-04-14: qty 1

## 2018-04-14 MED ORDER — PROPOFOL 10 MG/ML IV BOLUS
INTRAVENOUS | Status: DC | PRN
  Administered 2018-04-14: 20 mg via INTRAVENOUS

## 2018-04-14 MED ORDER — SODIUM CHLORIDE 0.9% IV SOLUTION
Freq: Once | INTRAVENOUS | Status: AC
  Administered 2018-04-14: 13:00:00 via INTRAVENOUS

## 2018-04-14 MED ORDER — LACTATED RINGERS IV SOLN
INTRAVENOUS | Status: DC
  Administered 2018-04-14: 10:00:00 via INTRAVENOUS

## 2018-04-14 SURGICAL SUPPLY — 15 items

## 2018-04-14 NOTE — Anesthesia Preprocedure Evaluation (Addendum)
Anesthesia Evaluation  Patient identified by MRN, date of birth, ID band Patient awake    Reviewed: Allergy & Precautions, NPO status , Patient's Chart, lab work & pertinent test results, reviewed documented beta blocker date and time   History of Anesthesia Complications (+) PONV and history of anesthetic complications  Airway Mallampati: II  TM Distance: >3 FB Neck ROM: Full    Dental no notable dental hx. (+) Dental Advisory Given, Edentulous Upper, Edentulous Lower   Pulmonary neg pulmonary ROS,  H/o right lung cancer s/p right upper lobectomy and right middle wedge resection   Pulmonary exam normal breath sounds clear to auscultation       Cardiovascular hypertension, Pt. on home beta blockers and Pt. on medications + Peripheral Vascular Disease  Normal cardiovascular exam+ dysrhythmias Ventricular Tachycardia  Rhythm:Regular Rate:Normal  EKG: LBBB  TTE 03/2018  1. The left ventricle has normal systolic function with an ejection fraction of 60-65%. The cavity size was normal. Left ventricular diastolic Doppler parameters are consistent with impaired relaxation.  2. The right ventricle has normal systolic function. The cavity was normal. There is no increase in right ventricular wall thickness.  3. Left atrial size was mildly dilated.  4. Mild mitral valve prolapse.  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  6. The tricuspid valve is normal in structure.  7. The aortic valve is tricuspid Mild thickening of the aortic valve Mild calcification of the aortic valve. Aortic valve regurgitation is mild by color flow Doppler.  8. The pulmonic valve was normal in structure.  9. Pulmonary hypertension is mild.    Neuro/Psych negative neurological ROS  negative psych ROS   GI/Hepatic Neg liver ROS, GERD  Medicated,  Endo/Other  negative endocrine ROS  Renal/GU negative Renal ROS  negative genitourinary   Musculoskeletal  (+)  Arthritis , Rheumatoid disorders,    Abdominal   Peds  Hematology  (+) Blood dyscrasia (Hgb 7.2), anemia ,   Anesthesia Other Findings Anemia, heme positive stool   Reproductive/Obstetrics                           Anesthesia Physical Anesthesia Plan  ASA: III  Anesthesia Plan: MAC   Post-op Pain Management:    Induction: Intravenous  PONV Risk Score and Plan: 3 and Propofol infusion and Treatment may vary due to age or medical condition  Airway Management Planned: Natural Airway and Nasal Cannula  Additional Equipment:   Intra-op Plan:   Post-operative Plan:   Informed Consent: I have reviewed the patients History and Physical, chart, labs and discussed the procedure including the risks, benefits and alternatives for the proposed anesthesia with the patient or authorized representative who has indicated his/her understanding and acceptance.     Dental advisory given  Plan Discussed with: CRNA  Anesthesia Plan Comments:         Anesthesia Quick Evaluation

## 2018-04-14 NOTE — Interval H&P Note (Signed)
History and Physical Interval Note:  04/14/2018 10:27 AM  [name]  has presented today for surgery, with the diagnosis of anemia, heme +, dark stools  The various methods of treatment have been discussed with the patient and family. After consideration of risks, benefits and other options for treatment, the patient has consented to  Procedure(s): ESOPHAGOGASTRODUODENOSCOPY (EGD) WITH PROPOFOL (N/A) as a surgical intervention .  The patient's history has been reviewed, patient examined, no change in status, stable for surgery.  I have reviewed the patient's chart and labs.  Questions were answered to the patient's satisfaction.     [place]

## 2018-04-14 NOTE — Progress Notes (Signed)
Pt received from endo. VSS. Clear liquid diet ordered. Will continue to monitor.  Clyde Canterbury, RN

## 2018-04-14 NOTE — Transfer of Care (Signed)
Immediate Anesthesia Transfer of Care Note  Patient: [name]  Procedure(s) Performed: ESOPHAGOGASTRODUODENOSCOPY (EGD) WITH PROPOFOL (N/A )  Patient Location: Endoscopy Unit  Anesthesia Type:MAC  Level of Consciousness: awake and patient cooperative  Airway & Oxygen Therapy: Patient Spontanous Breathing and Patient connected to nasal cannula oxygen  Post-op Assessment: Report given to RN, Post -op Vital signs reviewed and stable and Patient moving all extremities  Post vital signs: Reviewed and stable  Last Vitals:  Vitals Value Taken Time  BP    Temp    Pulse 78 04/14/2018 10:53 AM  Resp 17 04/14/2018 10:53 AM  SpO2 98 % 04/14/2018 10:53 AM  Vitals shown include unvalidated device data.  Last Pain:  Vitals:   04/14/18 0927  TempSrc: Oral  PainSc: 0-No pain      Patients Stated Pain Goal: 4 (62/70/35 0093)  Complications: No apparent anesthesia complications

## 2018-04-14 NOTE — Progress Notes (Signed)
Occupational Therapy Treatment Patient Details Name: [name] MRN: [id number] DOB: May 12, 1930 Today's Date: 04/14/2018    History of present illness 85 year old female with history of arthritis, GERD, hypothyroidism, LBBB who underwent right hip replacement 4 years ago, had revision of hip in October 2019 subsequently fell in November 2019 and had dislocation of the hip.  At that time attempts were made to relocate the hip twice until she subsequently underwent revision of right hip on 01/24/2018.  Since that time patient has swelling and pain in right groin.  She was seen by PCP at that time ultrasound showed large pseudoaneurysm.  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   OT comments  Assisted pt for toileting and seated grooming. Currently receiving blood transfusion. Pt eager to find out why she remains anemic. Will continue to follow.  Follow Up Recommendations  Home health OT;Supervision/Assistance - 24 hour    Equipment Recommendations  None recommended by OT    Recommendations for Other Services      Precautions / Restrictions Precautions Precautions: Fall Precaution Comments: pt receiving blood       Mobility Bed Mobility Overal bed mobility: Needs Assistance Bed Mobility: Supine to Sit;Sit to Supine     Supine to sit: Min assist Sit to supine: Min assist   General bed mobility comments: assist to raise trunk and for LEs back into bed  Transfers   Equipment used: Rolling walker (2 wheeled)                  Balance Overall balance assessment: Needs assistance   Sitting balance-Leahy Scale: Fair       Standing balance-Leahy Scale: Poor                             ADL either performed or assessed with clinical judgement   ADL  Overall ADL's : Needs assistance/impaired                     Lower Body Dressing: Maximal assistance;Sit to/from stand   Toilet Transfer: Minimal assistance;Stand-pivot;RW   Toileting- Clothing Manipulation and Hygiene: Maximal assistance;Sitting/lateral lean               Vision       Perception     Praxis      Cognition Arousal/Alertness: Awake/alert Behavior During Therapy: WFL for tasks assessed/performed Overall Cognitive Status: Within Functional Limits for tasks assessed                                          Exercises     Shoulder Instructions       General Comments      Pertinent Vitals/ Pain       Pain Assessment: Faces Faces Pain Scale: Hurts little more Pain Location: R LE Pain Descriptors / Indicators: Sore Pain Intervention(s): Monitored during session;Repositioned  Home Living                                          Prior Functioning/Environment  Frequency  Min 2X/week        Progress Toward Goals  OT Goals(current goals can now be found in the care plan section)  Progress towards OT goals: Not progressing toward goals - comment(pt more weak today)  Acute Rehab OT Goals Patient Stated Goal: to go home with increased assist OT Goal Formulation: With patient Time For Goal Achievement: 04/19/18 Potential to Achieve Goals: Good  Plan Discharge plan remains appropriate    Co-evaluation                 AM-PAC OT "6 Clicks" Daily Activity     Outcome Measure   Help from another person eating meals?: None Help from another person taking care of personal grooming?: A Little Help from another person toileting, which includes using toliet, bedpan, or urinal?: A Lot Help from another person bathing (including washing, rinsing, drying)?: A Lot Help from another person to put on and taking off regular upper body clothing?: None Help from another person to put on and  taking off regular lower body clothing?: A Lot 6 Click Score: 17    End of Session    OT Visit Diagnosis: Unsteadiness on feet (R26.81);Other abnormalities of gait and mobility (R26.89);Pain;Muscle weakness (generalized) (M62.81) Pain - Right/Left: Right Pain - part of body: Leg   Activity Tolerance Patient tolerated treatment well   Patient Left in bed;with call bell/phone within reach;with bed alarm set   Nurse Communication          Time: 9518-8416 OT Time Calculation (min): 23 min  Charges: OT General Charges $OT Visit: 1 Visit OT Treatments $Self Care/Home Management : 23-37 mins  Nestor Lewandowsky, OTR/L Acute Rehabilitation Services Pager: [phone] Office: [phone]  Malka So 04/14/2018, 4:06 PM

## 2018-04-14 NOTE — Op Note (Signed)
[facility] Patient Name: [name] Procedure Date : 04/14/2018 MRN: [id number] Attending MD: Carlota Raspberry. Margy Sumler , MD Date of Birth: 06/29/1930 CSN: [id number] Age: 85 Admit Type: Inpatient Procedure:                Upper GI endoscopy Indications:              Heme positive stool, on anticoagulation with                            recurrent anemia leading to transfusion, suspected                            upper gastrointestinal bleeding with history of                            NSAIDs. Providers:                Carlota Raspberry. Havery Moros, MD, Carlyn Reichert, RN, Elspeth Cho Tech., Technician, Claybon Jabs CRNA, CRNA Referring MD:              Medicines:                Monitored Anesthesia Care Complications:            No immediate complications. Estimated blood loss:                            Minimal. Estimated Blood Loss:     Estimated blood loss was minimal. Procedure:                Pre-Anesthesia Assessment:                           - Prior to the procedure, a History and Physical                            was performed, and patient medications and                            allergies were reviewed. The patient's tolerance of                            previous anesthesia was also reviewed. The risks                            and benefits of the procedure and the sedation                            options and risks were discussed with the patient.                            All questions were answered, and informed consent  was obtained. Prior Anticoagulants: The patient has                            taken Lovenox (enoxaparin), last dose was 1 day                            prior to procedure. After reviewing the risks and                            benefits, the patient was deemed in satisfactory                            condition to undergo the procedure.                           After obtaining  informed consent, the endoscope was                            passed under direct vision. Throughout the                            procedure, the patient's blood pressure, pulse, and                            oxygen saturations were monitored continuously. The                            GIF-H190 ([id number]) Olympus gastroscope was                            introduced through the mouth, and advanced to the                            second part of duodenum. The upper GI endoscopy was                            accomplished without difficulty. The patient                            tolerated the procedure well. Scope In: Scope Out: Findings:      Esophagogastric landmarks were identified: the Z-line was found at 28       cm, the gastroesophageal junction was found at 28 cm and the upper       extent of the gastric folds was found at 35 cm from the incisors.      A 7 cm hiatal hernia was present. Within the hernia sac were 2 small       erythematous areas but no obvious ulceration or erosion.      The examined esophagus was tortuous.      Localized focal area of nodularity was found in the cardia just inferior       to the GEJ within the hernia sac. I suspect benign inflammatory change       however biopsies were taken with a cold forceps for histology.      The  exam of the stomach was otherwise normal.      The examined duodenum was normal. Impression:               - Esophagogastric landmarks identified.                           - 7 cm hiatal hernia without any ulceration /                            erosion or stigmata for bleeding                           - Tortuous esophagus.                           - Nodular mucosa in the cardia as above. Biopsied.                           - Normal examined duodenum.                           No clear cause for patient's anemia / bleeding                            noted on EGD. Hiatal hernia is large and at risk                             for [facility] ulceration but none overtly seen. Recommendation:           - Return patient to hospital ward for ongoing care.                           - Clear liquid diet.                           - Continue present medications including oral PPI                           - Await pathology results.                           - Recommend colonoscopy to further evaluate and                            clear her lower tract. Hopefully patient can bowel                            prep today with plans for colonoscopy tomorrow if                            she is agreeable. 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 All rights reserved. The codes documented in this report are preliminary and upon coder review may  be revised to meet current compliance requirements. Remo Lipps P. Maveryck Bahri, MD 04/14/2018 11:00:32 AM This report has been signed electronically. Number of Addenda: 0

## 2018-04-14 NOTE — Progress Notes (Signed)
PROGRESS NOTE    [name]  KDX:[id number] DOB: [birth year] DOA: 04/09/2018 PCP: Darreld Mclean, MD   Brief Narrative:  The patient is an 85 year old female with history of arthritis, GERD, hypothyroidism, LBBB who underwent right hip replacement 4 years ago, had revision of hip in October 2019 subsequently fell in November 2019 and had dislocation of the hip.  At that time attempts were made to relocate the hip twice until she subsequently underwent revision of right hip on 01/24/2018.  Since that time patient has swelling and pain in right groin.  She was seen by PCP at that time ultrasound showed large pseudoaneurysm.  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  Also revealed occlusion of the right popliteal artery. She underwent repair of the aneurysm and embolectomy but subsequently hospital course has been complicated by Symptomatic Anemia so GI consulted and did EGD and are planning for colonoscopy in AM.  Assessment & Plan:   Principal Problem:   Femoral artery pseudo-aneurysm, right (HCC) Active Problems:   Symptomatic anemia   Elevated cholesterol   Hyponatremia   Popliteal artery occlusion, right (HCC)   HTN (hypertension)  Large Right Femoral Artery Pseudoaneurysm s/p Repair POD4 -Seen on CTA of RLE and it was orginating in the common femoral artery  -Repeat Vascular Arterial Duplex showed no obvious evidence of pseudoanyerusm noted and no evidence of bleeding  -Vascular Surgery Following and stated ok for patient to mobilize -Pain Control with Acetaminophen 650 mg po/RC q6hprn Mild Pain, Hydrocodone-Acetaminophen 1 tab po q4hprn Severe Pain and IV Hydromorphone 0.5-1 mg IV q2hprn Severe Pain not amenable to po Medications  -Antiemetics with 4 mg IV Ondansetron q6hprn Nausea/Vomiting -Further Care per Vascular Surgery   Right Popliteal and Tibial Artery Occlusion -CT with evidence of  right popliteal artery occlusive emboli. -S/p Right Popliteal and Tibial Embolectomy POD4 -Had a small amount of bloddy drainage and Drain is out -Anticoagulation as per vascular surgery recommendations and she was getting Lovenox daily but ?Anticoagulation at D/C for Right Popliteal Emboli -Further Care per Vascular Surgery   Symptomatic Anemia -Likely from acute blood loss from surgery and Heme Positive Stools/Melena after Surgery -Has a Hx of Taking NSAIDS -S/p Transfusion of 4 units of pRBC's and will order another 1 unit this AM -Hb/Hct Trending down and went from 8.6/27.0 -> 7.2/22.9 -FOBT was positive in January.   -Gastroenterology consulted and patient to undergo EGD this AM; ETD did now show a clear cause of why the patient had anemia or bleeding and they are recommending CLD today with a Colonoscopy tomorrow  -C/w Pantoprazole 40 mg po Daily   Hyponatremia -Mild, sodium was 132 on admission and now improved to 134 -Continue to Monitor and Trend and repeat CMP in AM  Hypothyroidism -Last TSH was 3.84 on 02/18/2018 -Continue Levothyroxine 88 mcg po qHS.  Hypertension -Continue Metoprolol Succinate 12.5 mg po Daily -C/w PRN Hydralazine 5 mg q4h for SBP>160  Hyperkalemia -Mild, potassium was 5.3 and improved and is now 4.9 -Continue to Monitor and Repeat CMP in AM  Acute Kidney Injury on CKD Stage 3 -Mild creatinine was elevated 1.85 and now is improved. -Baseline creatinine is around 1.2.   -BUN/Cr is now 23/1.10 -Avoid Nephrotoxic Medications/Contrast Dyes if possible -Continue to Monitor and repeat CMP in AM  Hyperglycemia -Patient's Blood Sugar has been ranging from 94-133 on Daily BMP's/CMP's -No Hemoglobin A1c on file so will  check in AM -Continue to Monitor Blood Sugars carefully and if consistently elevated will place on Sensitive Novolog SSI AC  Constipation, likely Opioid Induced -C/w Bisacodyl 5 mg po Dailyprn for Moderate Consitpation -C/w  Senna-Docusate 1 tab po qHSprn for Mild Constipation -C/w Miralax 17 grams po BID -Patient getting a Bowel Prep for Colonoscopy in AM   DVT prophylaxis: Lovenox currently being Held Code Status: FULL CODE Family Communication: Discussed with Daughter at bedside Disposition Plan: Pending further workup of Melena and clearance by Vascular Surgery and Gastroenterology   Consultants:   Vascular Surgery  [facility]   Procedures:  EGD Findings:      Esophagogastric landmarks were identified: the Z-line was found at 28       cm, the gastroesophageal junction was found at 28 cm and the upper       extent of the gastric folds was found at 35 cm from the incisors.      A 7 cm hiatal hernia was present. Within the hernia sac were 2 small       erythematous areas but no obvious ulceration or erosion.      The examined esophagus was tortuous.      Localized focal area of nodularity was found in the cardia just inferior       to the GEJ within the hernia sac. I suspect benign inflammatory change       however biopsies were taken with a cold forceps for histology.      The exam of the stomach was otherwise normal.      The examined duodenum was normal. Impression:               - Esophagogastric landmarks identified.                           - 7 cm hiatal hernia without any ulceration /                            erosion or stigmata for bleeding                           - Tortuous esophagus.                           - Nodular mucosa in the cardia as above. Biopsied.                           - Normal examined duodenum.                           No clear cause for patient's anemia / bleeding                            noted on EGD. Hiatal hernia is large and at risk                            for [facility] ulceration but none overtly seen.   Antimicrobials:  Anti-infectives (From admission, onward)   Start     Dose/Rate Route Frequency Ordered Stop   04/10/18 0845  ceFAZolin  (ANCEF) IVPB 2g/100 mL premix    Note to Pharmacy:  Send with pt to OR   2 g 200 mL/hr over 30 Minutes Intravenous On call 04/09/18 1843 04/10/18 0900     Subjective: Seen and examined at bedside and was feeling a little lightheaded after taking her Hydrocodone on an empty stomach. Has good pulses now and states she is thankful for her PCP. No SOB, CP. Has abdominal pain in the groin from incision. No other concerns or complaints at this time.   Objective: Vitals:   04/13/18 1954 04/14/18 0316 04/14/18 0744 04/14/18 0747  BP: 120/81 (!) 119/57 130/77 130/77  Pulse: 93  80 92  Resp: (!) 21 17 15 19   Temp: 98.9 F (37.2 C) 99.1 F (37.3 C) 98.2 F (36.8 C)   TempSrc: Oral Oral Oral   SpO2: 95% 95% 95% 99%  Weight:      Height:        Intake/Output Summary (Last 24 hours) at 04/14/2018 0914 Last data filed at 04/14/2018 6270 Gross per 24 hour  Intake 0 ml  Output 775 ml  Net -775 ml   Filed Weights   04/09/18 2348  Weight: 50.8 kg   Examination: Physical Exam:  Constitutional: WN/WD thin elderly Caucasian female in NAD and appears calm and comfortable Eyes: Lids and conjunctivae normal, sclerae anicteric  ENMT: External Ears, Nose appear normal. Grossly normal hearing. Mucous membranes are moist.  Neck: Appears normal, supple, no cervical masses, normal ROM, no appreciable thyromegaly; no JVD Respiratory: Clear to auscultation bilaterally, no wheezing, rales, rhonchi or crackles. Normal respiratory effort and patient is not tachypenic. No accessory muscle use.  Cardiovascular: RRR, no murmurs / rubs / gallops. S1 and S2 auscultated. 1+ LE Edema and Warmth on the Right Extremity. 2+ pedal pulses. Abdomen: Soft, non-tender, non-distended. No masses palpated. No appreciable hepatosplenomegaly. Bowel sounds positive.  GU: Deferred. Musculoskeletal: No clubbing / cyanosis of digits/nails. No joint deformity upper and lower extremities.  Skin: No rashes, lesions, ulcers on a  limited skin evaluation but does have some warmth and swelling on Right Leg with some mild bloody drainage from incision. No induration; Warm and dry.  Neurologic: CN 2-12 grossly intact with no focal deficits. Sensation intact in all 4 Extremities,  Psychiatric: Normal judgment and insight. Alert and oriented x 3. Normal mood and appropriate affect.   Data Reviewed: I have personally reviewed following labs and imaging studies  CBC: Recent Labs  Lab 04/09/18 1427  04/12/18 0335 04/12/18 1955 04/13/18 0240 04/13/18 1241 04/14/18 0313  WBC 4.7   < > 6.9 8.6 8.3 7.2 6.7  NEUTROABS 3.2  --   --   --   --   --   --   HGB 8.0*   < > 6.3* 9.8* 8.7* 8.6* 7.2*  HCT 26.8*   < > 19.5* 29.2* 26.6* 27.0* 22.9*  MCV 95.4   < > 91.1 89.0 89.0 90.3 91.6  PLT 152   < > 150 166 172 187 208   < > = values in this interval not displayed.   Basic Metabolic Panel: Recent Labs  Lab 04/10/18 0301 04/11/18 0020 04/12/18 0335 04/13/18 0240 04/14/18 0313  NA 133* 135 133* 134* 135  K 4.9 5.3* 4.7 4.7 4.9  CL 100 105 105 105 104  CO2 24 25 22 22 26   GLUCOSE 84 125* 94 109* 133*  BUN 19 21 37* 29* 23  CREATININE 1.11* 1.30* 1.85* 1.12* 1.10*  CALCIUM 8.9 8.1* 8.1* 8.4* 8.3*   GFR: Estimated Creatinine Clearance: 29.6 mL/min (  A) (by C-G formula based on SCr of 1.1 mg/dL (H)). Liver Function Tests: Recent Labs  Lab 04/11/18 0020  AST 19  ALT 11  ALKPHOS 35*  BILITOT 0.7  PROT 4.8*  ALBUMIN 2.6*   No results for input(s): LIPASE, AMYLASE in the last 168 hours. No results for input(s): AMMONIA in the last 168 hours. Coagulation Profile: No results for input(s): INR, PROTIME in the last 168 hours. Cardiac Enzymes: No results for input(s): CKTOTAL, CKMB, CKMBINDEX, TROPONINI in the last 168 hours. BNP (last 3 results) Recent Labs    02/18/18 1422 04/09/18 1122  PROBNP 192.0* 333.0*   HbA1C: No results for input(s): HGBA1C in the last 72 hours. CBG: No results for input(s): GLUCAP  in the last 168 hours. Lipid Profile: No results for input(s): CHOL, HDL, LDLCALC, TRIG, CHOLHDL, LDLDIRECT in the last 72 hours. Thyroid Function Tests: No results for input(s): TSH, T4TOTAL, FREET4, T3FREE, THYROIDAB in the last 72 hours. Anemia Panel: No results for input(s): VITAMINB12, FOLATE, FERRITIN, TIBC, IRON, RETICCTPCT in the last 72 hours. Sepsis Labs: No results for input(s): PROCALCITON, LATICACIDVEN in the last 168 hours.  Recent Results (from the past 240 hour(s))  MRSA PCR Screening     Status: None   Collection Time: 04/09/18 11:46 PM  Result Value Ref Range Status   MRSA by PCR NEGATIVE NEGATIVE Final    Comment:        The GeneXpert MRSA Assay (FDA approved for NASAL specimens only), is one component of a comprehensive MRSA colonization surveillance program. It is not intended to diagnose MRSA infection nor to guide or monitor treatment for MRSA infections. Performed at [facility], [place] [address], [place], [place] [address]     RN Pressure Injury Documentation:     Estimated body mass index is 20.49 kg/m as calculated from the following:   Height as of this encounter: 5\' 2"  (1.575 m).   Weight as of this encounter: 50.8 kg.  Radiology Studies: Vas [place] Lower Extremity Arterial Duplex  Result Date: 04/12/2018 LOWER EXTREMITY ARTERIAL DUPLEX STUDY Indications: Post Pseudoaneurysm repair.  Current ABI: n/a Comparison Study: Prior study from 04/09/18 is available Performing Technologist: Sharion Dove RVS  Examination Guidelines: A complete evaluation includes B-mode imaging, spectral Doppler, color Doppler, and power Doppler as needed of all accessible portions of each vessel. Bilateral testing is considered an integral part of a complete examination. Limited examinations for reoccurring indications may be performed as noted.  Summary: Right: No obvious evidence of pseudoaneurysm noted.  See table(s) above for measurements and observations.  Electronically signed by Ruta Hinds MD on 04/12/2018 at 3:33:30 PM.    Final    Scheduled Meds: . sodium chloride   Intravenous Once  . sodium chloride   Intravenous Once  . sodium chloride   Intravenous Once  . sodium chloride   Intravenous Once  . calcium-vitamin D  1 tablet Oral Q breakfast  . docusate sodium  100 mg Oral BID  . gabapentin  300 mg Oral BID WC  . gabapentin  600 mg Oral QHS  . levothyroxine  88 mcg Oral QHS  . metoprolol succinate  12.5 mg Oral Daily  . multivitamin with minerals  1 tablet Oral Daily  . pantoprazole  40 mg Oral Daily  . polyethylene glycol  17 g Oral BID   Continuous Infusions: . sodium chloride    . magnesium sulfate 1 - 4 g bolus IVPB      LOS: 5 days  Kerney Elbe, DO [facility] PAGER is on AMION  If 7PM-7AM, please contact night-coverage [email or web address] Password [facility] 04/14/2018, 9:14 AM

## 2018-04-14 NOTE — Progress Notes (Addendum)
[facility]  VASCULAR SURGERY ASSESSMENT & PLAN:   POD 4: STATUS POST REPAIR OF RIGHT FEMORAL ARTERY PSEUDOANEURYSM AND RIGHT POPLITEAL AND TIBIAL EMBOLECTOMY: Okay to mobilize from my standpoint.  She has a palpable right dorsalis pedis pulse.  She has a small amount of bloody drainage from the inferior aspect of her incision where there was a small suture removed yesterday.   ANEMIA:  She is for endoscopy today.  Raven Mayo, MD, FACS Beeper [phone] Office: [phone]     Subjective  - Doing a little better with ambulation and pain is slightly better.   Objective (!) 119/57 93 99.1 F (37.3 C) (Oral) 17 95%  Intake/Output Summary (Last 24 hours) at 04/14/2018 0724 Last data filed at 04/13/2018 2118 Gross per 24 hour  Intake 240 ml  Output 650 ml  Net -410 ml   Palpable DP left LE Cellulitis of lower leg appears to be dissipating a little, Lower leg incision isI healing well.   Groin still firm without expansion, incision healing well, JP exit site dressing changed.  Assessment/Planning: POD # 4 s/p:Repair of right femoral artery pseudoaneurysm and popliteal and tibial embolectomy.  Positive hemocult Primary team called with HGB drop to 7.2 they have ordered EGD for this am.  No additional transfusion has been ordered at this time.   BP stable at 119/57 with HR in the 90's, Alert and oriented x 3    [name] 04/14/2018 7:24 AM --  Laboratory Lab Results: Recent Labs    04/13/18 1241 04/14/18 0313  WBC 7.2 6.7  HGB 8.6* 7.2*  HCT 27.0* 22.9*  PLT 187 208   BMET Recent Labs    04/13/18 0240 04/14/18 0313  NA 134* 135  K 4.7 4.9  CL 105 104  CO2 22 26  GLUCOSE 109* 133*  BUN 29* 23  CREATININE 1.12* 1.10*  CALCIUM 8.4* 8.3*    COAG Lab Results  Component Value Date   INR 0.97 04/21/2014   INR 0.89 06/02/2013   INR 0.91 05/02/2013   No results found for: PTT

## 2018-04-14 NOTE — Progress Notes (Signed)
Pt w/ 26 beat run of VT witnessed by RN. Pt self converted. BP 127/57 (75). Pt states she felt fluttering in chest, otherwise asymptomatic. Alfredia Ferguson, MD notified.   Clyde Canterbury, RN

## 2018-04-15 ENCOUNTER — Inpatient Hospital Stay (HOSPITAL_COMMUNITY): Payer: Medicare Other

## 2018-04-15 ENCOUNTER — Encounter (HOSPITAL_COMMUNITY): Payer: Self-pay | Admitting: Physician Assistant

## 2018-04-15 DIAGNOSIS — I472 Ventricular tachycardia: Secondary | ICD-10-CM

## 2018-04-15 DIAGNOSIS — L03115 Cellulitis of right lower limb: Secondary | ICD-10-CM

## 2018-04-15 LAB — COMPREHENSIVE METABOLIC PANEL
ALT: 10 U/L (ref 0–44)
AST: 19 U/L (ref 15–41)
Albumin: 2.4 g/dL — ABNORMAL LOW (ref 3.5–5.0)
Alkaline Phosphatase: 41 U/L (ref 38–126)
Anion gap: 7 (ref 5–15)
BUN: 16 mg/dL (ref 8–23)
CO2: 26 mmol/L (ref 22–32)
CREATININE: 0.82 mg/dL (ref 0.44–1.00)
Calcium: 8.2 mg/dL — ABNORMAL LOW (ref 8.9–10.3)
Chloride: 101 mmol/L (ref 98–111)
GFR calc Af Amer: >60 mL/min (ref >60)
Glucose, Bld: 95 mg/dL (ref 70–99)
Potassium: 4.7 mmol/L (ref 3.5–5.1)
Sodium: 134 mmol/L — ABNORMAL LOW (ref 135–145)
Total Bilirubin: 1 mg/dL (ref 0.3–1.2)
Total Protein: 5 g/dL — ABNORMAL LOW (ref 6.5–8.1)

## 2018-04-15 LAB — TYPE AND SCREEN
ABO/RH(D): A POS
Antibody Screen: NEGATIVE
Unit division: 0

## 2018-04-15 LAB — CBC WITH DIFFERENTIAL/PLATELET
Abs Immature Granulocytes: 0.03 10*3/uL (ref 0.00–0.07)
Basophils Absolute: 0 10*3/uL (ref 0.0–0.1)
Basophils Relative: 1 %
Eosinophils Absolute: 0.1 10*3/uL (ref 0.0–0.5)
Eosinophils Relative: 2 %
HCT: 27.1 % — ABNORMAL LOW (ref 36.0–46.0)
Hemoglobin: 8.8 g/dL — ABNORMAL LOW (ref 12.0–15.0)
Immature Granulocytes: 0 %
Lymphocytes Relative: 11 %
Lymphs Abs: 0.8 10*3/uL (ref 0.7–4.0)
MCH: 29.3 pg (ref 26.0–34.0)
MCHC: 32.5 g/dL (ref 30.0–36.0)
MCV: 90.3 fL (ref 80.0–100.0)
MONOS PCT: 13 %
Monocytes Absolute: 1 10*3/uL (ref 0.1–1.0)
Neutro Abs: 5.5 10*3/uL (ref 1.7–7.7)
Neutrophils Relative %: 73 %
Platelets: 215 10*3/uL (ref 150–400)
RBC: 3 MIL/uL — ABNORMAL LOW (ref 3.87–5.11)
RDW: 14.8 % (ref 11.5–15.5)
WBC: 7.4 10*3/uL (ref 4.0–10.5)
nRBC: 0 % (ref 0.0–0.2)

## 2018-04-15 LAB — BASIC METABOLIC PANEL
Anion gap: 8 (ref 5–15)
BUN: 15 mg/dL (ref 8–23)
CO2: 28 mmol/L (ref 22–32)
Calcium: 8.1 mg/dL — ABNORMAL LOW (ref 8.9–10.3)
Chloride: 99 mmol/L (ref 98–111)
Creatinine, Ser: 0.77 mg/dL (ref 0.44–1.00)
GFR calc Af Amer: >60 mL/min (ref >60)
GFR calc non Af Amer: >60 mL/min (ref >60)
Glucose, Bld: 122 mg/dL — ABNORMAL HIGH (ref 70–99)
POTASSIUM: 4.3 mmol/L (ref 3.5–5.1)
Sodium: 135 mmol/L (ref 135–145)

## 2018-04-15 LAB — BPAM RBC
Blood Product Expiration Date: 202003222359
ISSUE DATE / TIME: 202002261222
Unit Type and Rh: 6200

## 2018-04-15 LAB — MAGNESIUM
MAGNESIUM: 2 mg/dL (ref 1.7–2.4)
Magnesium: 2 mg/dL (ref 1.7–2.4)

## 2018-04-15 LAB — PHOSPHORUS: Phosphorus: 3.5 mg/dL (ref 2.5–4.6)

## 2018-04-15 LAB — HEMOGLOBIN A1C
Hgb A1c MFr Bld: 5.3 % (ref 4.8–5.6)
Mean Plasma Glucose: 105.41 mg/dL

## 2018-04-15 MED ORDER — SODIUM CHLORIDE 0.9 % IV SOLN
1.0000 g | INTRAVENOUS | Status: DC
  Administered 2018-04-15 – 2018-04-16 (×2): 1 g via INTRAVENOUS
  Filled 2018-04-15 (×3): qty 10

## 2018-04-15 MED ORDER — PEG-KCL-NACL-NASULF-NA ASC-C 100 G PO SOLR
1.0000 | Freq: Once | ORAL | Status: AC
  Administered 2018-04-16: 200 g via ORAL
  Filled 2018-04-15: qty 1

## 2018-04-15 MED ORDER — PEG-KCL-NACL-NASULF-NA ASC-C 100 G PO SOLR
1.0000 | Freq: Once | ORAL | Status: AC
  Administered 2018-04-15: 200 g via ORAL
  Filled 2018-04-15 (×2): qty 1

## 2018-04-15 NOTE — Progress Notes (Signed)
PROGRESS NOTE    [name]  KXF:[id number] DOB: 10-27-1930 DOA: 04/09/2018 PCP: Darreld Mclean, MD   Brief Narrative:  The patient is an 85 year old female with history of arthritis, GERD, hypothyroidism, LBBB who underwent right hip replacement 4 years ago, had revision of hip in October 2019 subsequently fell in November 2019 and had dislocation of the hip.  At that time attempts were made to relocate the hip twice until she subsequently underwent revision of right hip on 01/24/2018.  Since that time patient has swelling and pain in right groin.  She was seen by PCP at that time ultrasound showed large pseudoaneurysm.  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  Also revealed occlusion of the right popliteal artery. She underwent repair of the aneurysm and embolectomy but subsequently hospital course has been complicated by Symptomatic Anemia so GI consulted and did EGD and are planning for colonoscopy this AM. Hospitalization has further been complicated by Nonsustained VTACH and Right Leg Cellulitis.   Assessment & Plan:   Principal Problem:   Femoral artery pseudo-aneurysm, right (HCC) Active Problems:   Symptomatic anemia   Elevated cholesterol   Hyponatremia   Popliteal artery occlusion, right (HCC)   HTN (hypertension)  Large Right Femoral Artery Pseudoaneurysm s/p Repair POD5 -Seen on CTA of RLE and it was orginating in the common femoral artery  -Repeat Vascular Arterial Duplex showed no obvious evidence of pseudoanyerusm noted and no evidence of bleeding  -Vascular Surgery Following and stated ok for patient to mobilize -Pain Control with Acetaminophen 650 mg po/RC q6hprn Mild Pain, Hydrocodone-Acetaminophen 1 tab po q4hprn Severe Pain and IV Hydromorphone 0.5-1 mg IV q2hprn Severe Pain not amenable to po Medications  -Antiemetics with 4 mg IV Ondansetron q6hprn Nausea/Vomiting -Further  Care per Vascular Surgery   Right Popliteal and Tibial Artery Occlusion -CT with evidence of right popliteal artery occlusive emboli. -S/p Right Popliteal and Tibial Embolectomy POD5 -Had a small amount of bloddy drainage and Drain is out -Anticoagulation as per vascular surgery recommendations and she was getting Lovenox daily but ?Anticoagulation at D/C for Right Popliteal Emboli -Further Care per Vascular Surgery   Symptomatic Anemia -Likely from acute blood loss from surgery and Heme Positive Stools/Melena after Surgery -Has a Hx of Taking NSAIDS -S/p Transfusion of 4 units of pRBC's and will order another 1 unit this AM -Hb/Hct Trending down and went from 8.6/27.0 -> 7.2/22.9; Now improved after transfusion yesterday and is now 8.8/27.1 -FOBT was positive in January.   -Gastroenterology consulted and patient to undergo EGD this AM; ETD did now show a clear cause of why the patient had anemia or bleeding and they are recommending CLD today with a Colonoscopy to be done today  -C/w Pantoprazole 40 mg po Daily   Hyponatremia -Mild, sodium was 132 on admission and now improved to 135 but now is back to 134 -Continue to Monitor and Trend and repeat CMP in AM  Hypothyroidism -Last TSH was 3.84 on 02/18/2018 -Continue Levothyroxine 88 mcg po qHS.  Hypertension -Continue Metoprolol Succinate 12.5 mg po Daily -C/w PRN Hydralazine 5 mg q4h for SBP>160 -BP is now 125/55  Hyperkalemia -Mild, potassium was 5.3 and improved and is now 4.7 -Continue to Monitor and Repeat CMP in AM  Acute Kidney Injury on CKD Stage 3 -Mild creatinine was elevated 1.85 and now is improved. -Baseline creatinine is around 1.2.   -BUN/Cr is now  16/0.82 -Avoid Nephrotoxic Medications/Contrast Dyes if possible -Continue to Monitor and repeat CMP in AM  Hyperglycemia -Patient's Blood Sugar has been ranging from 94-133 on Daily BMP's/CMP's -No Hemoglobin A1c on file but checked and was 5.3 -Continue to  Monitor Blood Sugars carefully and if consistently elevated will place on Sensitive Novolog SSI AC  Constipation, likely Opioid Induced -C/w Bisacodyl 5 mg po Dailyprn for Moderate Consitpation -C/w Senna-Docusate 1 tab po qHSprn for Mild Constipation -C/w Miralax 17 grams po BID -Patient getting a Bowel Prep for Colonoscopy this Afternoon  Non-Sustained VTACH -Had a 26 beat run yesterday -Has a Hx of this and will need to continue to Monitor on Telemetry -C/w Home Metoprolol; Keep K+ >4.0 and Mag >2.0 -If happens further will consult Cardiology  Right Leg Cellulitis -Has warmth and erythema along with significant swelling; Originially thought to be post op changes from embolectomy but daughter and patient feel it has been going on for over a month -Get R Leg Tibia/Fibula X-Ray -Right Leg Venous Duplex Negative for DVT -She is Afebrile and has No Leukocytosis -Will start Empiric Abx with IV Ceftriaxone and have asked nurse to mark it. -Daughter states it has a hx of "Bubbling" -Continue to Monitor closely and watch clinical response to intervention   DVT prophylaxis: Lovenox currently being Held Code Status: FULL CODE Family Communication: Discussed with Daughter at bedside Disposition Plan: Pending further workup of Melena and clearance by Vascular Surgery and Gastroenterology   Consultants:   Vascular Surgery  [facility]   Procedures:  EGD Findings:      Esophagogastric landmarks were identified: the Z-line was found at 28       cm, the gastroesophageal junction was found at 28 cm and the upper       extent of the gastric folds was found at 35 cm from the incisors.      A 7 cm hiatal hernia was present. Within the hernia sac were 2 small       erythematous areas but no obvious ulceration or erosion.      The examined esophagus was tortuous.      Localized focal area of nodularity was found in the cardia just inferior       to the GEJ within the hernia sac.  I suspect benign inflammatory change       however biopsies were taken with a cold forceps for histology.      The exam of the stomach was otherwise normal.      The examined duodenum was normal. Impression:               - Esophagogastric landmarks identified.                           - 7 cm hiatal hernia without any ulceration /                            erosion or stigmata for bleeding                           - Tortuous esophagus.                           - Nodular mucosa in the cardia as above. Biopsied.                           -  Normal examined duodenum.                           No clear cause for patient's anemia / bleeding                            noted on EGD. Hiatal hernia is large and at risk                            for [facility] ulceration but none overtly seen.   Antimicrobials:  Anti-infectives (From admission, onward)   Start     Dose/Rate Route Frequency Ordered Stop   04/15/18 0915  cefTRIAXone (ROCEPHIN) 1 g in sodium chloride 0.9 % 100 mL IVPB     1 g 200 mL/hr over 30 Minutes Intravenous Every 24 hours 04/15/18 0909     04/10/18 0845  ceFAZolin (ANCEF) IVPB 2g/100 mL premix    Note to Pharmacy:  Send with pt to OR   2 g 200 mL/hr over 30 Minutes Intravenous On call 04/09/18 1843 04/10/18 0900     Subjective: Seen and examined at bedside and had no complaints this AM. States her "Heart was acting up again" and states that [facility] is not new for her. No CP, SOB. Had some Nausea with the Bowel prep and states Abdomen hurts "when you mash on it." No other concerns or complaints at this time and going for colonoscopy later this afternoon.   Objective: Vitals:   04/14/18 1602 04/14/18 2007 04/15/18 0634 04/15/18 0834  BP: (!) 130/58 (!) 123/59  (!) 124/55  Pulse: 81 77  63  Resp: 19 18    Temp: 98.4 F (36.9 C) 97.7 F (36.5 C) 97.8 F (36.6 C)   TempSrc: Oral Oral Oral   SpO2: 98% 96%    Weight:      Height:        Intake/Output Summary (Last 24  hours) at 04/15/2018 0915 Last data filed at 04/14/2018 1905 Gross per 24 hour  Intake 939.17 ml  Output 800 ml  Net 139.17 ml   Filed Weights   04/09/18 2348 04/14/18 0927  Weight: 50.8 kg 50.8 kg   Examination: Physical Exam:  Constitutional: WN/WD thin elderly Caucasian female in NAD appears calm and comfortable Eyes: Sclerae anicteric. Lids and conjunctivae normal. ENMT: External Ears and nose appear normal. MMM Neck: Supple with no JVD Respiratory: Slightly diminished but no appreciable wheezing/rales/rhonchi. Unlabored breathing and not using any accessory muscles to breathe Cardiovascular: RRR. No appreciable m/r/g. 1-2+ Right Leg edema with erythema and warmth. Distal pulses 2+  Abdomen: Soft, tender to palpate. Non-distended. Bowel sounds present  GU: Deferred Musculoskeletal: No contractures or cyanosis. Skin: No appreciable rashes or lesions. Has some warmth and erythema along with some significant swelling in Right Leg. Incision appears C/D/I Neurologic: CN 2-12 grossly intact with no appreciable focal deficits.  Psychiatric: Pleasant mood and affect. Intact judgment and insight  Data Reviewed: I have personally reviewed following labs and imaging studies  CBC: Recent Labs  Lab 04/09/18 1427  04/12/18 1955 04/13/18 0240 04/13/18 1241 04/14/18 0313 04/15/18 0255  WBC 4.7   < > 8.6 8.3 7.2 6.7 7.4  NEUTROABS 3.2  --   --   --   --   --  5.5  HGB 8.0*   < > 9.8* 8.7* 8.6* 7.2* 8.8*  HCT  26.8*   < > 29.2* 26.6* 27.0* 22.9* 27.1*  MCV 95.4   < > 89.0 89.0 90.3 91.6 90.3  PLT 152   < > 166 172 187 208 215   < > = values in this interval not displayed.   Basic Metabolic Panel: Recent Labs  Lab 04/11/18 0020 04/12/18 0335 04/13/18 0240 04/14/18 0313 04/15/18 0255  NA 135 133* 134* 135 134*  K 5.3* 4.7 4.7 4.9 4.7  CL 105 105 105 104 101  CO2 25 22 22 26 26   GLUCOSE 125* 94 109* 133* 95  BUN 21 37* 29* 23 16  CREATININE 1.30* 1.85* 1.12* 1.10* 0.82    CALCIUM 8.1* 8.1* 8.4* 8.3* 8.2*  MG  --   --   --  2.1 2.0  PHOS  --   --   --  2.6 3.5   GFR: Estimated Creatinine Clearance: 39.7 mL/min (by C-G formula based on SCr of 0.82 mg/dL). Liver Function Tests: Recent Labs  Lab 04/11/18 0020 04/15/18 0255  AST 19 19  ALT 11 10  ALKPHOS 35* 41  BILITOT 0.7 1.0  PROT 4.8* 5.0*  ALBUMIN 2.6* 2.4*   No results for input(s): LIPASE, AMYLASE in the last 168 hours. No results for input(s): AMMONIA in the last 168 hours. Coagulation Profile: No results for input(s): INR, PROTIME in the last 168 hours. Cardiac Enzymes: No results for input(s): CKTOTAL, CKMB, CKMBINDEX, TROPONINI in the last 168 hours. BNP (last 3 results) Recent Labs    02/18/18 1422 04/09/18 1122  PROBNP 192.0* 333.0*   HbA1C: Recent Labs    04/15/18 0255  HGBA1C 5.3   CBG: No results for input(s): GLUCAP in the last 168 hours. Lipid Profile: No results for input(s): CHOL, HDL, LDLCALC, TRIG, CHOLHDL, LDLDIRECT in the last 72 hours. Thyroid Function Tests: No results for input(s): TSH, T4TOTAL, FREET4, T3FREE, THYROIDAB in the last 72 hours. Anemia Panel: No results for input(s): VITAMINB12, FOLATE, FERRITIN, TIBC, IRON, RETICCTPCT in the last 72 hours. Sepsis Labs: No results for input(s): PROCALCITON, LATICACIDVEN in the last 168 hours.  Recent Results (from the past 240 hour(s))  MRSA PCR Screening     Status: None   Collection Time: 04/09/18 11:46 PM  Result Value Ref Range Status   MRSA by PCR NEGATIVE NEGATIVE Final    Comment:        The GeneXpert MRSA Assay (FDA approved for NASAL specimens only), is one component of a comprehensive MRSA colonization surveillance program. It is not intended to diagnose MRSA infection nor to guide or monitor treatment for MRSA infections. Performed at [facility], [place] [address], [place], [place]  [address]     RN Pressure Injury Documentation:     Estimated body mass index is 20.49 kg/m as  calculated from the following:   Height as of this encounter: 5\' 2"  (1.575 m).   Weight as of this encounter: 50.8 kg.  Radiology Studies: No results found. Scheduled Meds: . sodium chloride   Intravenous Once  . sodium chloride   Intravenous Once  . sodium chloride   Intravenous Once  . calcium-vitamin D  1 tablet Oral Q breakfast  . docusate sodium  100 mg Oral BID  . gabapentin  300 mg Oral BID WC  . gabapentin  600 mg Oral QHS  . levothyroxine  88 mcg Oral QHS  . metoprolol succinate  12.5 mg Oral Daily  . multivitamin with minerals  1 tablet Oral Daily  . pantoprazole  40 mg Oral Daily  . polyethylene glycol  17 g Oral BID   Continuous Infusions: . sodium chloride    . cefTRIAXone (ROCEPHIN)  IV    . magnesium sulfate 1 - 4 g bolus IVPB      LOS: 6 days   Kerney Elbe, DO [facility] PAGER is on AMION  If 7PM-7AM, please contact night-coverage [email or web address] Password [email or web address] 04/15/2018, 9:15 AM

## 2018-04-15 NOTE — Care Management Note (Signed)
Case Management Note Marvetta Gibbons RN, BSN Transitions of Care Unit 4E- RN Case Manager [phone]  Patient Details  Name: [name] MRN: [id number] Date of Birth: 10-27-1930  Subjective/Objective:   Pt admitted with femoral artery pseudo-aneurysm, ?GIB                 Action/Plan: PTA Pt lived at home with private duty caregiver support 3-4 hours per day 7 days/week. Pt's daughter also lives nearby. Notified by CSW that pt prefers to return home with the current arrangements that she has and [facility] services- CM spoke with pt to confirm plans, list provided to pt for [facility] agency Per CMS guidelines from medicare.gov website with star ratings (copy placed in shadow chart), per pt she has used [facility] in the past and wants to use them again for [facility] services (she would love to have same therapist she had before is able)- per pt she has all needed DME at home from prior hip surgery. Will need HH orders with [facility] prior to discharge, referral pending orders.   Expected Discharge Date:                  Expected Discharge Plan:  [place]  In-House Referral:  Clinical Social Work  Discharge planning Services  CM Consult  Post Acute Care Choice:  Home Health Choice offered to:  Patient  DME Arranged:    DME Agency:     HH Arranged:    [place] Agency:  [facility] (formerly [employer]  Status of Service:  In process, will continue to follow  If discussed at Long Length of Stay Meetings, dates discussed:    Discharge Disposition: home/home health   Additional Comments:  Dawayne Patricia, RN 04/15/2018, 11:50 AM

## 2018-04-15 NOTE — Progress Notes (Signed)
PT Cancellation Note  Patient Details Name: [name] MRN: [id number] DOB: [birth year]   Cancelled Treatment:    Reason Eval/Treat Not Completed: Other (comment).  Pt has declined x 2 with report initially of having just returned from testing and being tired, then was declining due to suspected DVT on RLE.  Pt asking to wait, will try again tomorrow.   Ramond Dial 04/15/2018, 3:08 PM  Mee Hives, PT MS Acute Rehab Dept. Number: [place] and [place]

## 2018-04-15 NOTE — Anesthesia Postprocedure Evaluation (Addendum)
Anesthesia Post Note  Patient: [name]  Procedure(s) Performed: ESOPHAGOGASTRODUODENOSCOPY (EGD) WITH PROPOFOL (N/A ) BIOPSY     Patient location during evaluation: Endoscopy Anesthesia Type: MAC Level of consciousness: awake and alert Pain management: pain level controlled Vital Signs Assessment: post-procedure vital signs reviewed and stable Respiratory status: spontaneous breathing, nonlabored ventilation, respiratory function stable and patient connected to nasal cannula oxygen Cardiovascular status: blood pressure returned to baseline and stable Postop Assessment: no apparent nausea or vomiting Anesthetic complications: no    Last Vitals:  Vitals:   04/14/18 2007 04/15/18 0634  BP: (!) 123/59   Pulse: 77   Resp: 18   Temp: 36.5 C 36.6 C  SpO2: 96%     Last Pain:  Vitals:   04/15/18 0634  TempSrc: Oral  PainSc:                  Mathius Birkeland L [name]

## 2018-04-15 NOTE — Progress Notes (Signed)
Patient ID: [name], female   DOB: [birth year], 85 y.o.   MRN: [id number]   BRIEF GI NOTE: 4pm  BMP and Mg++ levels ok. Pt to proceed with Movi prep as ordered for colonoscopy 04/16/2018. RN to monitor for any further VT.

## 2018-04-15 NOTE — Progress Notes (Signed)
     [facility] Progress Note  CC:  Anemia, heme + stool   Subjective: Increased nausea from bowel prep. Nausea controlled after Zofran IV. No vomiting. No abdominal pain. She complains of fatigue.    Objective:   26 beat run of V tach yesterday around 1pm  Vital signs in last 24 hours: Temp:  [97.7 F (36.5 C)-98.5 F (36.9 C)] 97.8 F (36.6 C) (02/27 0634) Pulse Rate:  [63-81] 63 (02/27 0834) Resp:  [12-28] 18 (02/26 2007) BP: (114-130)/(49-61) 124/55 (02/27 0834) SpO2:  [95 %-100 %] 96 % (02/26 2007) Last BM Date: 04/09/18 General:   Alert,  Well-developed,    in NAD Heart:  Regular rate and rhythm; no murmurs Pulm: clear throughout.  Abdomen:  Soft, nontender and nondistended. Normal bowel sounds, without guarding, and without rebound.  Right groin incision site intact, small amount of red blood oozing from site. No purulent drainage or erythema. Extremities:  Without edema. Neurologic:  Alert and  oriented x4;  grossly normal neurologically. Psych:  Alert and cooperative. Normal mood and affect.  Intake/Output from previous day: 02/26 0701 - 02/27 0700 In: 939.2 [I.V.:624.2; Blood:315] Out: 7408 [Urine:1225] Intake/Output this shift: No intake/output data recorded.  Lab Results: Recent Labs    04/13/18 1241 04/14/18 0313 04/15/18 0255  WBC 7.2 6.7 7.4  HGB 8.6* 7.2* 8.8*  HCT 27.0* 22.9* 27.1*  PLT 187 208 215   BMET Recent Labs    04/13/18 0240 04/14/18 0313 04/15/18 0255  NA 134* 135 134*  K 4.7 4.9 4.7  CL 105 104 101  CO2 22 26 26   GLUCOSE 109* 133* 95  BUN 29* 23 16  CREATININE 1.12* 1.10* 0.82  CALCIUM 8.4* 8.3* 8.2*   LFT Recent Labs    04/15/18 0255  PROT 5.0*  ALBUMIN 2.4*  AST 19  ALT 10  ALKPHOS 41  BILITOT 1.0    Assessment:   *   FOBT positive anemia.  Dark stools.  Chronic meloxicam. 2/26 EGD: Large hiatal hernia with no stigmata of bleeding.  Tortuous esophagus.  Nodular mucosa in the gastric cardia.  No  obvious etiology for patient's anemia/bleeding.  HH large and at risk for [facility] ulcerations but these not seen. S/p 6 U PRBCs. Last unit on 2/26, finished at 4 PM.  H/H stable. Hg 8.8. HCT 27.1.  * Ventricular tachycardia. Patient had 26 beat run of VT, BP 127/57  on 04/14/2018. Patient felt brief fluttering in chest. No further runs of VT. Today K+4.7. Na 134.  *  S/p 04/10/18 repair of right femoral artery pseudoaneurysm, popliteal/tibial embolectomy. CT pelvis of 2/24 showed what looks like a hematoma in the right hip/groin with possible hemarthrosis.  Suspected right acetabular stress fracture and subacute right greater trochanter fracture  *   Multiple autoimmune, connective tissue disorders.  Includes Sjogren's syndrome, rheumatoid arthritis, raynaud's phenomenon, discoid lupus  Plan:  * Reschedule colonoscopy 04/16/2018, patient completed Movi prep this am but she was still passing dark liquid stool. She will remain on clear liquids today, repeat Movi prep this evening. NPO after midnight.  + Repeat BMP and Magnesium level  at 4pm prior to starting bowel prep  * Continue to monitor for cardiac arrhythmia/VT  *  Dr. Rush Landmark to verify if cardiac evaluation required prior to proceeding with colonoscopy       LOS: 6 days   Noralyn Pick  04/15/2018, 10:25 AM

## 2018-04-15 NOTE — Progress Notes (Addendum)
[facility] [place]  Subjective  - doing well over all.   Objective (!) 123/59 77 97.8 F (36.6 C) (Oral) 18 96%  Intake/Output Summary (Last 24 hours) at 04/15/2018 0745 Last data filed at 04/14/2018 1905 Gross per 24 hour  Intake 939.17 ml  Output 1225 ml  Net -285.83 ml   Palpable DP, mild cellulitis below incision with mild edema.  Lower leg healing well. Groin still firm at baseline pre-op.  Incision healing well.     Assessment/Planning: POD # 5 Repair of right femoral artery pseudoaneurysm and popliteal and tibial embolectomy.  EGD did not reveal a bleeding source.  Plan for Colonoscopy   HGB 8.8 after 1 additional unit yesterday  BP stable 123/59 and HR at 77.   Alert and oriented. Stable patent blood flow to left LE and no recurrent pseudoaneurysm per duplex and CT.   [name] 04/15/2018 7:45 AM --  I have interviewed the patient and examined the patient. I agree with the findings by the PA. At this point, not much to add from a vascular standpoint. She has a palpable dorsalis pedis pulse.  Her incision is healing nicely.  Gae Gallop, MD [phone]   Laboratory Lab Results: Recent Labs    04/14/18 0313 04/15/18 0255  WBC 6.7 7.4  HGB 7.2* 8.8*  HCT 22.9* 27.1*  PLT 208 215   BMET Recent Labs    04/14/18 0313 04/15/18 0255  NA 135 134*  K 4.9 4.7  CL 104 101  CO2 26 26  GLUCOSE 133* 95  BUN 23 16  CREATININE 1.10* 0.82  CALCIUM 8.3* 8.2*    COAG Lab Results  Component Value Date   INR 0.97 04/21/2014   INR 0.89 06/02/2013   INR 0.91 05/02/2013   No results found for: PTT

## 2018-04-15 NOTE — H&P (View-Only) (Signed)
     [facility] Progress Note  CC:  Anemia, heme + stool   Subjective: Increased nausea from bowel prep. Nausea controlled after Zofran IV. No vomiting. No abdominal pain. She complains of fatigue.    Objective:   26 beat run of V tach yesterday around 1pm  Vital signs in last 24 hours: Temp:  [97.7 F (36.5 C)-98.5 F (36.9 C)] 97.8 F (36.6 C) (02/27 0634) Pulse Rate:  [63-81] 63 (02/27 0834) Resp:  [12-28] 18 (02/26 2007) BP: (114-130)/(49-61) 124/55 (02/27 0834) SpO2:  [95 %-100 %] 96 % (02/26 2007) Last BM Date: 04/09/18 General:   Alert,  Well-developed,    in NAD Heart:  Regular rate and rhythm; no murmurs Pulm: clear throughout.  Abdomen:  Soft, nontender and nondistended. Normal bowel sounds, without guarding, and without rebound.  Right groin incision site intact, small amount of red blood oozing from site. No purulent drainage or erythema. Extremities:  Without edema. Neurologic:  Alert and  oriented x4;  grossly normal neurologically. Psych:  Alert and cooperative. Normal mood and affect.  Intake/Output from previous day: 02/26 0701 - 02/27 0700 In: 939.2 [I.V.:624.2; Blood:315] Out: 2330 [Urine:1225] Intake/Output this shift: No intake/output data recorded.  Lab Results: Recent Labs    04/13/18 1241 04/14/18 0313 04/15/18 0255  WBC 7.2 6.7 7.4  HGB 8.6* 7.2* 8.8*  HCT 27.0* 22.9* 27.1*  PLT 187 208 215   BMET Recent Labs    04/13/18 0240 04/14/18 0313 04/15/18 0255  NA 134* 135 134*  K 4.7 4.9 4.7  CL 105 104 101  CO2 22 26 26   GLUCOSE 109* 133* 95  BUN 29* 23 16  CREATININE 1.12* 1.10* 0.82  CALCIUM 8.4* 8.3* 8.2*   LFT Recent Labs    04/15/18 0255  PROT 5.0*  ALBUMIN 2.4*  AST 19  ALT 10  ALKPHOS 41  BILITOT 1.0    Assessment:   *   FOBT positive anemia.  Dark stools.  Chronic meloxicam. 2/26 EGD: Large hiatal hernia with no stigmata of bleeding.  Tortuous esophagus.  Nodular mucosa in the gastric cardia.  No  obvious etiology for patient's anemia/bleeding.  HH large and at risk for [facility] ulcerations but these not seen. S/p 6 U PRBCs. Last unit on 2/26, finished at 4 PM.  H/H stable. Hg 8.8. HCT 27.1.  * Ventricular tachycardia. Patient had 26 beat run of VT, BP 127/57  on 04/14/2018. Patient felt brief fluttering in chest. No further runs of VT. Today K+4.7. Na 134.  *  S/p 04/10/18 repair of right femoral artery pseudoaneurysm, popliteal/tibial embolectomy. CT pelvis of 2/24 showed what looks like a hematoma in the right hip/groin with possible hemarthrosis.  Suspected right acetabular stress fracture and subacute right greater trochanter fracture  *   Multiple autoimmune, connective tissue disorders.  Includes Sjogren's syndrome, rheumatoid arthritis, raynaud's phenomenon, discoid lupus  Plan:  * Reschedule colonoscopy 04/16/2018, patient completed Movi prep this am but she was still passing dark liquid stool. She will remain on clear liquids today, repeat Movi prep this evening. NPO after midnight.  + Repeat BMP and Magnesium level  at 4pm prior to starting bowel prep  * Continue to monitor for cardiac arrhythmia/VT  *  Dr. Rush Landmark to verify if cardiac evaluation required prior to proceeding with colonoscopy       LOS: 6 days   Noralyn Pick  04/15/2018, 10:25 AM

## 2018-04-16 ENCOUNTER — Inpatient Hospital Stay (HOSPITAL_COMMUNITY): Payer: Medicare Other | Admitting: Certified Registered Nurse Anesthetist

## 2018-04-16 ENCOUNTER — Other Ambulatory Visit (INDEPENDENT_AMBULATORY_CARE_PROVIDER_SITE_OTHER): Payer: Self-pay | Admitting: Orthopaedic Surgery

## 2018-04-16 ENCOUNTER — Encounter (HOSPITAL_COMMUNITY)
Admission: EM | Disposition: A | Discharge status: Home Health | Payer: Self-pay | Source: Home / Self Care | Attending: Family Medicine

## 2018-04-16 ENCOUNTER — Telehealth: Payer: Self-pay

## 2018-04-16 ENCOUNTER — Encounter (HOSPITAL_COMMUNITY): Payer: Self-pay | Admitting: *Deleted

## 2018-04-16 DIAGNOSIS — D123 Benign neoplasm of transverse colon: Secondary | ICD-10-CM

## 2018-04-16 DIAGNOSIS — Q438 Other specified congenital malformations of intestine: Secondary | ICD-10-CM

## 2018-04-16 DIAGNOSIS — D638 Anemia in other chronic diseases classified elsewhere: Secondary | ICD-10-CM

## 2018-04-16 DIAGNOSIS — M3500 Sicca syndrome, unspecified: Secondary | ICD-10-CM

## 2018-04-16 HISTORY — PX: BIOPSY: SHX5522

## 2018-04-16 HISTORY — PX: COLONOSCOPY WITH PROPOFOL: SHX5780

## 2018-04-16 LAB — COMPREHENSIVE METABOLIC PANEL
ALBUMIN: 2.4 g/dL — AB (ref 3.5–5.0)
ALT: 11 U/L (ref 0–44)
AST: 20 U/L (ref 15–41)
Alkaline Phosphatase: 47 U/L (ref 38–126)
Anion gap: 8 (ref 5–15)
BUN: 13 mg/dL (ref 8–23)
CO2: 26 mmol/L (ref 22–32)
Calcium: 8.1 mg/dL — ABNORMAL LOW (ref 8.9–10.3)
Chloride: 101 mmol/L (ref 98–111)
Creatinine, Ser: 0.75 mg/dL (ref 0.44–1.00)
GFR calc Af Amer: >60 mL/min (ref >60)
GFR calc non Af Amer: >60 mL/min (ref >60)
Glucose, Bld: 92 mg/dL (ref 70–99)
Potassium: 3.9 mmol/L (ref 3.5–5.1)
SODIUM: 135 mmol/L (ref 135–145)
Total Bilirubin: 1.1 mg/dL (ref 0.3–1.2)
Total Protein: 5.1 g/dL — ABNORMAL LOW (ref 6.5–8.1)

## 2018-04-16 LAB — CBC WITH DIFFERENTIAL/PLATELET
Abs Immature Granulocytes: 0.03 10*3/uL (ref 0.00–0.07)
BASOS ABS: 0 10*3/uL (ref 0.0–0.1)
BASOS PCT: 0 %
Eosinophils Absolute: 0.1 10*3/uL (ref 0.0–0.5)
Eosinophils Relative: 1 %
HCT: 26.9 % — ABNORMAL LOW (ref 36.0–46.0)
Hemoglobin: 8.8 g/dL — ABNORMAL LOW (ref 12.0–15.0)
Immature Granulocytes: 0 %
Lymphocytes Relative: 9 %
Lymphs Abs: 0.7 10*3/uL (ref 0.7–4.0)
MCH: 29.7 pg (ref 26.0–34.0)
MCHC: 32.7 g/dL (ref 30.0–36.0)
MCV: 90.9 fL (ref 80.0–100.0)
Monocytes Absolute: 1 10*3/uL (ref 0.1–1.0)
Monocytes Relative: 13 %
Neutro Abs: 5.9 10*3/uL (ref 1.7–7.7)
Neutrophils Relative %: 77 %
Platelets: 270 10*3/uL (ref 150–400)
RBC: 2.96 MIL/uL — ABNORMAL LOW (ref 3.87–5.11)
RDW: 14.7 % (ref 11.5–15.5)
WBC: 7.7 10*3/uL (ref 4.0–10.5)
nRBC: 0 % (ref 0.0–0.2)

## 2018-04-16 LAB — MAGNESIUM: Magnesium: 1.9 mg/dL (ref 1.7–2.4)

## 2018-04-16 LAB — PHOSPHORUS: Phosphorus: 3.4 mg/dL (ref 2.5–4.6)

## 2018-04-16 SURGERY — COLONOSCOPY WITH PROPOFOL
Anesthesia: Monitor Anesthesia Care

## 2018-04-16 MED ORDER — LIDOCAINE 2% (20 MG/ML) 5 ML SYRINGE
INTRAMUSCULAR | Status: DC | PRN
  Administered 2018-04-16: 60 mg via INTRAVENOUS

## 2018-04-16 MED ORDER — FUROSEMIDE 10 MG/ML IJ SOLN
40.0000 mg | Freq: Once | INTRAMUSCULAR | Status: AC
  Administered 2018-04-16: 40 mg via INTRAVENOUS
  Filled 2018-04-16: qty 4

## 2018-04-16 MED ORDER — LACTATED RINGERS IV SOLN
INTRAVENOUS | Status: DC | PRN
  Administered 2018-04-16: 09:00:00 via INTRAVENOUS

## 2018-04-16 MED ORDER — ONDANSETRON HCL 4 MG/2ML IJ SOLN
INTRAMUSCULAR | Status: DC | PRN
  Administered 2018-04-16: 4 mg via INTRAVENOUS

## 2018-04-16 MED ORDER — PROPOFOL 500 MG/50ML IV EMUL
INTRAVENOUS | Status: DC | PRN
  Administered 2018-04-16: 150 ug/kg/min via INTRAVENOUS

## 2018-04-16 MED ORDER — LACTATED RINGERS IV SOLN
INTRAVENOUS | Status: AC | PRN
  Administered 2018-04-16: 1000 mL via INTRAVENOUS

## 2018-04-16 MED ORDER — PROPOFOL 10 MG/ML IV BOLUS
INTRAVENOUS | Status: DC | PRN
  Administered 2018-04-16 (×4): 10 mg via INTRAVENOUS

## 2018-04-16 SURGICAL SUPPLY — 22 items

## 2018-04-16 NOTE — Progress Notes (Signed)
OT Cancellation Note  Patient Details Name: SHANDALE MALAK MRN: [id number] DOB: Oct 19, 1930   Cancelled Treatment:    Reason Eval/Treat Not Completed: Patient at procedure or test/ unavailable(colonoscopy). Will follow.  Malka So 04/16/2018, 8:27 AM  Nestor Lewandowsky, OTR/L Acute Rehabilitation Services Pager: [phone] Office: [phone]

## 2018-04-16 NOTE — Progress Notes (Signed)
PT Cancellation Note  Patient Details Name: [name] MRN: [id number] DOB: 12-Jul-1930   Cancelled Treatment:    Reason Eval/Treat Not Completed: Patient at procedure or test/unavailable (colonoscopy).  Ellamae Sia, PT, DPT Acute Rehabilitation Services Pager [phone] Office [phone]    Willy Eddy 04/16/2018, 8:08 AM

## 2018-04-16 NOTE — Progress Notes (Signed)
Patient off unit for procedure.

## 2018-04-16 NOTE — Anesthesia Preprocedure Evaluation (Signed)
Anesthesia Evaluation  Patient identified by MRN, date of birth, ID band Patient awake    Reviewed: Allergy & Precautions, H&P , NPO status , Patient's Chart, lab work & pertinent test results  History of Anesthesia Complications (+) PONV and history of anesthetic complications  Airway Mallampati: II   Neck ROM: full    Dental   Pulmonary neg pulmonary ROS,    breath sounds clear to auscultation       Cardiovascular hypertension, + Peripheral Vascular Disease  + dysrhythmias  Rhythm:regular Rate:Normal  LBBB TTE (03/2018): normal LV function. 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   Neuro/Psych  Neuromuscular disease    GI/Hepatic hiatal hernia, GERD  ,  Endo/Other  Hypothyroidism   Renal/GU      Musculoskeletal  (+) Arthritis ,   Abdominal   Peds  Hematology  (+) Blood dyscrasia, anemia ,   Anesthesia Other Findings   Reproductive/Obstetrics                             Anesthesia Physical Anesthesia Plan  ASA: III  Anesthesia Plan: MAC   Post-op Pain Management:    Induction: Intravenous  PONV Risk Score and Plan: 3 and Propofol infusion and Treatment may vary due to age or medical condition  Airway Management Planned: Nasal Cannula  Additional Equipment:   Intra-op Plan:   Post-operative Plan:   Informed Consent: I have reviewed the patients History and Physical, chart, labs and discussed the procedure including the risks, benefits and alternatives for the proposed anesthesia with the patient or authorized representative who has indicated his/her understanding and acceptance.       Plan Discussed with: CRNA, Anesthesiologist and Surgeon  Anesthesia Plan Comments:         Anesthesia Quick Evaluation

## 2018-04-16 NOTE — Progress Notes (Signed)
Physical Therapy Treatment Patient Details Name: [name] MRN: [id number] DOB: Feb 27, 1930 Today's Date: 04/16/2018    History of Present Illness 85 year old female with history of arthritis, GERD, hypothyroidism, LBBB who underwent right hip replacement 4 years ago, had revision of hip in October 2019 subsequently fell in November 2019 and had dislocation of the hip.  At that time attempts were made to relocate the hip twice until she subsequently underwent revision of right hip on 01/24/2018.  Since that time patient has swelling and pain in right groin.  She was seen by PCP at that time ultrasound showed large pseudoaneurysm.  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    PT Comments    Pt progressing gradually towards her physical therapy goals. Requiring min assist for transfers and min guard for ambulating a distance of 30 feet using walker. Pt with slow speed but overall no gross imbalance. Assisted patient for toileting. Will continue to progress mobility as tolerated.    Follow Up Recommendations  Home health PT;Supervision/Assistance - 24 hour(if unable to arrange initial 24/7 may need SNF)     Equipment Recommendations  None recommended by PT    Recommendations for Other Services       Precautions / Restrictions Precautions Precautions: Fall Restrictions Weight Bearing Restrictions: No    Mobility  Bed Mobility Overal bed mobility: Needs Assistance Bed Mobility: Sit to Supine       Sit to supine: Min assist   General bed mobility comments: pt seeking handheld assist for trunk elevation  Transfers Overall transfer level: Needs assistance Equipment used: Rolling walker (2 wheeled) Transfers: Sit to/from Stand Sit to Stand: Min assist         General  transfer comment: Min assist to power up to standing from edge of bed and BSC  Ambulation/Gait Ambulation/Gait assistance: Min guard Gait Distance (Feet): 30 Feet Assistive device: Rolling walker (2 wheeled) Gait Pattern/deviations: Antalgic;Decreased stride length;Trunk flexed Gait velocity: decreased   General Gait Details: cues for walker proximity, upright posture and looking up   Stairs             Wheelchair Mobility    Modified Rankin (Stroke Patients Only)       Balance Overall balance assessment: Needs assistance Sitting-balance support: No upper extremity supported;Feet supported Sitting balance-Leahy Scale: Good     Standing balance support: No upper extremity supported;During functional activity Standing balance-Leahy Scale: Poor Standing balance comment: Reliance on UE support via RW                            Cognition Arousal/Alertness: Awake/alert Behavior During Therapy: WFL for tasks assessed/performed Overall Cognitive Status: Within Functional Limits for tasks assessed                                        Exercises      General Comments        Pertinent Vitals/Pain Pain Assessment: Faces Faces Pain Scale: Hurts little more Pain Location: groin Pain Descriptors / Indicators: Sore Pain Intervention(s): Monitored during session    Home Living  Prior Function            PT Goals (current goals can now be found in the care plan section) Acute Rehab PT Goals Patient Stated Goal: to go home with increased assist PT Goal Formulation: With patient Time For Goal Achievement: 04/25/18 Potential to Achieve Goals: Good Progress towards PT goals: Progressing toward goals    Frequency    Min 3X/week      PT Plan Current plan remains appropriate    Co-evaluation              AM-PAC PT "6 Clicks" Mobility   Outcome Measure  Help needed turning from your back to  your side while in a flat bed without using bedrails?: A Little Help needed moving from lying on your back to sitting on the side of a flat bed without using bedrails?: A Little Help needed moving to and from a bed to a chair (including a wheelchair)?: A Little Help needed standing up from a chair using your arms (e.g., wheelchair or bedside chair)?: A Little Help needed to walk in hospital room?: A Little Help needed climbing 3-5 steps with a railing? : A Lot 6 Click Score: 17    End of Session Equipment Utilized During Treatment: Gait belt Activity Tolerance: Patient tolerated treatment well Patient left: in bed;with call bell/phone within reach Nurse Communication: Mobility status PT Visit Diagnosis: Unsteadiness on feet (R26.81);Difficulty in walking, not elsewhere classified (R26.2)     Time: 3007-6226 PT Time Calculation (min) (ACUTE ONLY): 31 min  Charges:  $Gait Training: 8-22 mins $Therapeutic Activity: 8-22 mins                     [name], PT, DPT Acute Rehabilitation Services Pager [phone] Office [phone]    Willy Eddy 04/16/2018, 4:49 PM

## 2018-04-16 NOTE — Interval H&P Note (Signed)
History and Physical Interval Note:  04/16/2018 8:54 AM  [name]  has presented today for surgery, with the diagnosis of anemia, heme positive stool  The various methods of treatment have been discussed with the patient and family. After consideration of risks, benefits and other options for treatment, the patient has consented to  Procedure(s): COLONOSCOPY WITH PROPOFOL (N/A) as a surgical intervention .  The patient's history has been reviewed, patient examined, no change in status, stable for surgery.  I have reviewed the patient's chart and labs.  Questions were answered to the patient's satisfaction.     [employer]

## 2018-04-16 NOTE — Telephone Encounter (Signed)
-----   Message from Noralyn Pick, NP sent at 04/16/2018  4:51 PM EST ----- Regarding: Hospital follow up appointment Hi Maclane Holloran, can you schedule patient for a follow ov in 4 weeks with Dr. Rush Landmark, she will need repeat CBC, iron, iron saturation, TIBC and Ferritin level one week before her appointment.

## 2018-04-16 NOTE — Progress Notes (Signed)
PROGRESS NOTE    [name]  TKZ:[id number] DOB: 11-05-1930 DOA: 04/09/2018 PCP: Darreld Mclean, MD   Brief Narrative:  The patient is an 85 year old female with history of arthritis, GERD, hypothyroidism, LBBB who underwent right hip replacement 4 years ago, had revision of hip in October 2019 subsequently fell in November 2019 and had dislocation of the hip.  At that time attempts were made to relocate the hip twice until she subsequently underwent revision of right hip on 01/24/2018.  Since that time patient has swelling and pain in right groin.  She was seen by PCP at that time ultrasound showed large pseudoaneurysm.  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  Also revealed occlusion of the right popliteal artery. She underwent repair of the aneurysm and embolectomy but subsequently hospital course has been complicated by Symptomatic Anemia so GI consulted and did EGD and are planning for colonoscopy which was done this AM. Hospitalization has further been complicated by Nonsustained VTACH and Right Leg Cellulitis.   Assessment & Plan:   Principal Problem:   Femoral artery pseudo-aneurysm, right (HCC) Active Problems:   Symptomatic anemia   Elevated cholesterol   Hyponatremia   Popliteal artery occlusion, right (HCC)   HTN (hypertension)  Large Right Femoral Artery Pseudoaneurysm s/p Repair POD6 -Seen on CTA of RLE and it was orginating in the common femoral artery  -Repeat Vascular Arterial Duplex showed no obvious evidence of pseudoanyerusm noted and no evidence of bleeding  -Vascular Surgery Following and stated ok for patient to mobilize -Pain Control with Acetaminophen 650 mg po/RC q6hprn Mild Pain, Hydrocodone-Acetaminophen 1 tab po q4hprn Severe Pain and IV Hydromorphone 0.5-1 mg IV q2hprn Severe Pain not amenable to po Medications  -Antiemetics with 4 mg IV Ondansetron q6hprn  Nausea/Vomiting -Further Care per Vascular Surgery   Right Popliteal and Tibial Artery Occlusion -CT with evidence of right popliteal artery occlusive emboli. -S/p Right Popliteal and Tibial Embolectomy POD6 -Had a small amount of bloddy drainage and Drain is out -Anticoagulation as per vascular surgery recommendations and she was getting Lovenox daily but ?Anticoagulation at D/C for Right Popliteal Emboli -Further Care per Vascular Surgery   Symptomatic Anemia -Likely from acute blood loss from surgery and Heme Positive Stools/Melena after Surgery -Has a Hx of Taking NSAIDS -S/p Transfusion of 4 units of pRBC's and will order another 1 unit this AM -Hb/Hct Trending down and went from 8.6/27.0 -> 7.2/22.9; Now improved after transfusion and is now 8.8/26.9 -FOBT was positive in January.   -Gastroenterology consulted and patient to undergo EGD this AM; EGD did now show a clear cause of why the patient had anemia or bleeding and they are recommending CLD with Colonoscopy -Colonoscopy unfortunately unable to be done yesterday due to patient not being prepared but was done today -Colonoscopy report pending but per patient she had 3 polyps removed; GI advanced Diet  -C/w Pantoprazole 40 mg po Daily for now and Further Care per GI  Hyponatremia -Mild, sodium was 132 on admission and now improved to 135  -Continue to Monitor and Trend and repeat CMP in AM  Hypothyroidism -Last TSH was 3.84 on 02/18/2018 -Continue Levothyroxine 88 mcg po qHS.  Hypertension -Continue Metoprolol Succinate 12.5 mg po Daily -C/w PRN Hydralazine 5 mg q4h for SBP>160 -BP is now 147/65  Hyperkalemia -Mild, potassium was 5.3 and improved and is now 3.9 -Continue to Monitor and Repeat CMP in  AM  Acute Kidney Injury on CKD Stage 3 -Mild creatinine was elevated 1.85 and now is improved. -Baseline creatinine is around 1.2.   -BUN/Cr is now 13/0.75 -Avoid Nephrotoxic Medications/Contrast Dyes if  possible -Continue to Monitor and repeat CMP in AM  Hyperglycemia -Patient's Blood Sugar has been ranging from 94-133 on Daily BMP's/CMP's -Hemoglobin A1c checked and was 5.3 -Continue to Monitor Blood Sugars carefully and if consistently elevated will place on Sensitive Novolog SSI AC  Constipation, likely Opioid Induced -C/w Bisacodyl 5 mg po Dailyprn for Moderate Consitpation -C/w Senna-Docusate 1 tab po qHSprn for Mild Constipation -C/w Miralax 17 grams po BID -Patient getting a Bowel Prep for Colonoscopy this Afternoon  Non-Sustained VTACH -Had a 26 beat run the day before yesterday  -Has a Hx of this and will need to continue to Monitor on Telemetry -C/w Home Metoprolol; Keep K+ >4.0 and Mag >2.0 -If happens further will consult Cardiology  Right Leg Cellulitis -Has warmth and erythema along with significant swelling; Originially thought to be post op changes from embolectomy but daughter and patient feel it has been going on for over a month -R Leg Tibia/Fibula X-Ray showed "Postsurgical changes are seen in the soft tissues. Mild degenerative changes at the knee joint are noted. No acute fracture or dislocation is seen. No gross soft tissue abnormality is noted." -Right Leg Venous Duplex Negative for DVT -She is Afebrile and has No Leukocytosis -Will start Empiric Abx with IV Ceftriaxone and have asked nurse to mark it. Erythema has started spreading but not as wam -Daughter states it has a hx of "Bubbling" -Continue to Monitor closely and watch clinical response to intervention  -Will give 1x dose of IV 40 mg of Lasix given Right Leg Swelling  DVT prophylaxis: Lovenox currently being Held Code Status: FULL CODE Family Communication: No family present at bedside  Disposition Plan: Pending further workup of Melena and clearance by Vascular Surgery and Gastroenterology   Consultants:   Vascular Surgery  [facility]   Procedures:  EGD Findings:       Esophagogastric landmarks were identified: the Z-line was found at 28       cm, the gastroesophageal junction was found at 28 cm and the upper       extent of the gastric folds was found at 35 cm from the incisors.      A 7 cm hiatal hernia was present. Within the hernia sac were 2 small       erythematous areas but no obvious ulceration or erosion.      The examined esophagus was tortuous.      Localized focal area of nodularity was found in the cardia just inferior       to the GEJ within the hernia sac. I suspect benign inflammatory change       however biopsies were taken with a cold forceps for histology.      The exam of the stomach was otherwise normal.      The examined duodenum was normal. Impression:               - Esophagogastric landmarks identified.                           - 7 cm hiatal hernia without any ulceration /  erosion or stigmata for bleeding                           - Tortuous esophagus.                           - Nodular mucosa in the cardia as above. Biopsied.                           - Normal examined duodenum.                           No clear cause for patient's anemia / bleeding                            noted on EGD. Hiatal hernia is large and at risk                            for [facility] ulceration but none overtly seen.  COLONOSCOPY Done and pending Report    Antimicrobials:  Anti-infectives (From admission, onward)   Start     Dose/Rate Route Frequency Ordered Stop   04/15/18 1000  cefTRIAXone (ROCEPHIN) 1 g in sodium chloride 0.9 % 100 mL IVPB     1 g 200 mL/hr over 30 Minutes Intravenous Every 24 hours 04/15/18 0909     04/10/18 0845  ceFAZolin (ANCEF) IVPB 2g/100 mL premix    Note to Pharmacy:  Send with pt to OR   2 g 200 mL/hr over 30 Minutes Intravenous On call 04/09/18 1843 04/10/18 0900     Subjective: Seen and examined at bedside after her colonoscopy and states she felt a little more swollen. No nausea  or vomiting Feels ok today. Happy that there were 3 polyps removed. No other concerns or complaints at this time.      Objective: Vitals:   04/16/18 0809 04/16/18 1010 04/16/18 1025 04/16/18 1043  BP:  133/64 (!) 141/60 (!) 147/65  Pulse:   65 66  Resp:  (!) 21 20 17   Temp:  98.4 F (36.9 C)  98.2 F (36.8 C)  TempSrc:  Oral  Oral  SpO2: 100% 100% 97% 97%  Weight:      Height:        Intake/Output Summary (Last 24 hours) at 04/16/2018 1100 Last data filed at 04/16/2018 1010 Gross per 24 hour  Intake 880 ml  Output 400 ml  Net 480 ml   Filed Weights   04/09/18 2348 04/14/18 0927 04/16/18 0807  Weight: 50.8 kg 50.8 kg 50.8 kg   Examination: Physical Exam:  Constitutional: Well-nourished, well-developed thin elderly Caucasian female currently no acute distress appears calm comfortable after her colonoscopy Eyes: Sclera anicteric.  Lids and conjunctive are normal ENMT: External ears nose appear normal.  Mucous members are moist Neck: Supple with no JVD Respiratory: Slightly diminished auscultation with no appreciable wheezing, rales, rhonchi.  Patient had unlabored breathing and is not using any accessory muscles to breathe Cardiovascular: Regular rate and rhythm.  Has a slight murmur.  Has 1+ to 2+ lower extremity right leg edema and erythema.  Warmth is improved however Abdomen: Soft, nontender to palpate.  Nondistended.  Bowel sounds present GU: Deferred Musculoskeletal: No contractures or cyanosis Skin: Skin is warm and dry  but does have some significant right leg erythema and swelling distally.  She the right leg incision appears clean dry and intact.  Erythema has slightly worsened from yesterday Neurologic: Cranial nerves II through XII grossly intact no appreciable focal deficits Psychiatric: Pleasant mood and affect.  Intact judgment intact.  She is awake and alert and oriented x3  Data Reviewed: I have personally reviewed following labs and imaging  studies  CBC: Recent Labs  Lab 04/09/18 1427  04/13/18 0240 04/13/18 1241 04/14/18 0313 04/15/18 0255 04/16/18 0332  WBC 4.7   < > 8.3 7.2 6.7 7.4 7.7  NEUTROABS 3.2  --   --   --   --  5.5 5.9  HGB 8.0*   < > 8.7* 8.6* 7.2* 8.8* 8.8*  HCT 26.8*   < > 26.6* 27.0* 22.9* 27.1* 26.9*  MCV 95.4   < > 89.0 90.3 91.6 90.3 90.9  PLT 152   < > 172 187 208 215 270   < > = values in this interval not displayed.   Basic Metabolic Panel: Recent Labs  Lab 04/13/18 0240 04/14/18 0313 04/15/18 0255 04/15/18 1614 04/16/18 0332  NA 134* 135 134* 135 135  K 4.7 4.9 4.7 4.3 3.9  CL 105 104 101 99 101  CO2 22 26 26 28 26   GLUCOSE 109* 133* 95 122* 92  BUN 29* 23 16 15 13   CREATININE 1.12* 1.10* 0.82 0.77 0.75  CALCIUM 8.4* 8.3* 8.2* 8.1* 8.1*  MG  --  2.1 2.0 2.0 1.9  PHOS  --  2.6 3.5  --  3.4   GFR: Estimated Creatinine Clearance: 40.7 mL/min (by C-G formula based on SCr of 0.75 mg/dL). Liver Function Tests: Recent Labs  Lab 04/11/18 0020 04/15/18 0255 04/16/18 0332  AST 19 19 20   ALT 11 10 11   ALKPHOS 35* 41 47  BILITOT 0.7 1.0 1.1  PROT 4.8* 5.0* 5.1*  ALBUMIN 2.6* 2.4* 2.4*   No results for input(s): LIPASE, AMYLASE in the last 168 hours. No results for input(s): AMMONIA in the last 168 hours. Coagulation Profile: No results for input(s): INR, PROTIME in the last 168 hours. Cardiac Enzymes: No results for input(s): CKTOTAL, CKMB, CKMBINDEX, TROPONINI in the last 168 hours. BNP (last 3 results) Recent Labs    02/18/18 1422 04/09/18 1122  PROBNP 192.0* 333.0*   HbA1C: Recent Labs    04/15/18 0255  HGBA1C 5.3   CBG: No results for input(s): GLUCAP in the last 168 hours. Lipid Profile: No results for input(s): CHOL, HDL, LDLCALC, TRIG, CHOLHDL, LDLDIRECT in the last 72 hours. Thyroid Function Tests: No results for input(s): TSH, T4TOTAL, FREET4, T3FREE, THYROIDAB in the last 72 hours. Anemia Panel: No results for input(s): VITAMINB12, FOLATE, FERRITIN,  TIBC, IRON, RETICCTPCT in the last 72 hours. Sepsis Labs: No results for input(s): PROCALCITON, LATICACIDVEN in the last 168 hours.  Recent Results (from the past 240 hour(s))  MRSA PCR Screening     Status: None   Collection Time: 04/09/18 11:46 PM  Result Value Ref Range Status   MRSA by PCR NEGATIVE NEGATIVE Final    Comment:        The GeneXpert MRSA Assay (FDA approved for NASAL specimens only), is one component of a comprehensive MRSA colonization surveillance program. It is not intended to diagnose MRSA infection nor to guide or monitor treatment for MRSA infections. Performed at [facility], [place] [address], [place], [place] [address]     RN Pressure Injury  Documentation:     Estimated body mass index is 20.49 kg/m as calculated from the following:   Height as of this encounter: 5\' 2"  (1.575 m).   Weight as of this encounter: 50.8 kg.  Radiology Studies: Dg Tibia/fibula Right  Result Date: 04/15/2018 CLINICAL DATA:  Changes of cellulitis EXAM: RIGHT TIBIA AND FIBULA - 2 VIEW COMPARISON:  None. FINDINGS: Postsurgical changes are seen in the soft tissues. Mild degenerative changes at the knee joint are noted. No acute fracture or dislocation is seen. No gross soft tissue abnormality is noted. IMPRESSION: Mild degenerative changes about the knee joint. No acute abnormality is noted. Electronically Signed   By: Inez Catalina M.D.   On: 04/15/2018 13:52   Scheduled Meds: . sodium chloride   Intravenous Once  . sodium chloride   Intravenous Once  . sodium chloride   Intravenous Once  . calcium-vitamin D  1 tablet Oral Q breakfast  . docusate sodium  100 mg Oral BID  . gabapentin  300 mg Oral BID WC  . gabapentin  600 mg Oral QHS  . levothyroxine  88 mcg Oral QHS  . metoprolol succinate  12.5 mg Oral Daily  . multivitamin with minerals  1 tablet Oral Daily  . pantoprazole  40 mg Oral Daily   Continuous Infusions: . sodium chloride    . cefTRIAXone (ROCEPHIN)   IV Stopped (04/15/18 1139)  . magnesium sulfate 1 - 4 g bolus IVPB      LOS: 7 days   Kerney Elbe, DO [facility] PAGER is on [place]  If 7PM-7AM, please contact night-coverage [email or web address] Password [email or web address] 04/16/2018, 11:00 AM

## 2018-04-16 NOTE — Op Note (Signed)
[facility] Patient Name: [name] Procedure Date : 04/16/2018 MRN: [id number] Attending MD: Justice Britain , MD Date of Birth: [birth year] CSN: [id number] Age: 85 Admit Type: Inpatient Procedure:                Colonoscopy Indications:              Gastrointestinal occult blood loss, FHx of Colon                            Cancer Providers:                Justice Britain, MD, Cleda Daub, RN,                            William Dalton, Technician Referring MD:             Dr. Alfredia Ferguson Medicines:                Monitored Anesthesia Care Complications:            No immediate complications. Estimated Blood Loss:     Estimated blood loss was minimal. Procedure:                Pre-Anesthesia Assessment:                           - Prior to the procedure, a History and Physical                            was performed, and patient medications and                            allergies were reviewed. The patient's tolerance of                            previous anesthesia was also reviewed. The risks                            and benefits of the procedure and the sedation                            options and risks were discussed with the patient.                            All questions were answered, and informed consent                            was obtained. Prior Anticoagulants: The patient has                            taken Lovenox (enoxaparin). ASA Grade Assessment:                            III - A patient with severe systemic disease. After                            reviewing  the risks and benefits, the patient was                            deemed in satisfactory condition to undergo the                            procedure.                           After obtaining informed consent, the colonoscope                            was passed under direct vision. Throughout the                            procedure, the patient's blood pressure, pulse,  and                            oxygen saturations were monitored continuously. The                            PCF-H190DL ([id number] Olympus pediatric colonscope                            was introduced through the anus and advanced to the                            4 cm into the ileum. The colonoscopy was somewhat                            difficult due to a redundant colon, significant                            looping and a tortuous colon. Successful completion                            of the procedure was aided by changing the                            patient's position, using manual pressure,                            straightening and shortening the scope to obtain                            bowel loop reduction and using scope torsion. The                            patient tolerated the procedure. The quality of the                            bowel preparation was good. The terminal ileum,  ileocecal valve, appendiceal orifice, and rectum                            were photographed. Scope In: 9:30:21 AM Scope Out: 10:03:06 AM Scope Withdrawal Time: 0 hours 15 minutes 6 seconds  Total Procedure Duration: 0 hours 32 minutes 45 seconds  Findings:      Hemorrhoids were found on perianal exam.      The terminal ileum and ileocecal valve appeared normal.      The colon (entire examined portion) was significantly tortuous.      Three sessile polyps were found in the transverse colon. The polyps were       1 to 2 mm in size. These polyps were removed with a cold biopsy forceps.       Resection and retrieval were complete.      Non-bleeding non-thrombosed internal hemorrhoids were found during       retroflexion, during perianal exam and during digital exam. The       hemorrhoids were Grade II (internal hemorrhoids that prolapse but reduce       spontaneously). Impression:               - Hemorrhoids found on perianal exam.                           -  The examined portion of the ileum was normal.                           - Tortuous colon.                           - Three 1 to 2 mm polyps in the transverse colon,                            removed with a cold biopsy forceps. Resected and                            retrieved.                           - Non-bleeding non-thrombosed internal hemorrhoids. Recommendation:           - The patient will be observed post-procedure,                            until all discharge criteria are met.                           - Return patient to hospital ward for ongoing care.                           - Resume regular diet.                           - Await pathology results.                           - Repeat colonoscopy for surveillance based on  pathology results.                           - Ideally to decrease risk of post-polypectomy                            bleeding, it would be ideal to hold anticoagulation                            for 24 hours, but if anticoagulation is required                            would consider IV Heparin in interim.                           - Patient can be scheduled back in clinic in                            4-weeks. She can have Iron/TIBC/Ferritin/CBC                            performed prior to clinic visit.                           - The findings and recommendations were discussed                            with the patient.                           - Return to referring physician. Procedure Code(s):        --- Professional ---                           [phone], Colonoscopy, flexible; with biopsy, single                            or multiple Diagnosis Code(s):        --- Professional ---                           K64.1, Second degree hemorrhoids                           D12.3, Benign neoplasm of transverse colon (hepatic                            flexure or splenic flexure)                           R19.5, Other fecal  abnormalities                           Q43.8, Other specified congenital malformations of  intestine CPT copyright 2018 [employer]. All rights reserved. The codes documented in this report are preliminary and upon coder review may  be revised to meet current compliance requirements. Justice Britain, MD 04/16/2018 12:02:05 PM Number of Addenda: 0

## 2018-04-16 NOTE — Telephone Encounter (Signed)
Please advise 

## 2018-04-16 NOTE — Progress Notes (Signed)
   VASCULAR SURGERY ASSESSMENT & PLAN:   POD 6: STATUS POST REPAIR OF RIGHT FEMORAL ARTERY PSEUDOANEURYSM AND RIGHT POPLITEAL AND TIBIAL EMBOLECTOMY: Her incisions are healing adequately.  She has a small amount of serous drainage from her groin incision.  She has a palpable right dorsalis pedis pulse.  I will check back on Monday if she is not discharged before then.  If there are any problems over the weekend Dr. Donzetta Matters is on call.  SUBJECTIVE:   No specific complaints.  PHYSICAL EXAM:   Vitals:   04/16/18 1043 04/16/18 1200 04/16/18 1216 04/16/18 1300  BP: (!) 147/65  (!) 131/55   Pulse: 66  97 98  Resp: 17 18 (!) 22 (!) 22  Temp: 98.2 F (36.8 C)  98.2 F (36.8 C)   TempSrc: Oral  Oral   SpO2: 97%  99% 97%  Weight:      Height:       Palpable right dorsalis pedis pulse. Incisions look fine.  Small amount of serous drainage from right groin incision.   LABS:   Lab Results  Component Value Date   WBC 7.7 04/16/2018   HGB 8.8 (L) 04/16/2018   HCT 26.9 (L) 04/16/2018   MCV 90.9 04/16/2018   PLT 270 04/16/2018   PROBLEM LIST:    Principal Problem:   Femoral artery pseudo-aneurysm, right (HCC) Active Problems:   Symptomatic anemia   Elevated cholesterol   Hyponatremia   Popliteal artery occlusion, right (HCC)   HTN (hypertension)  CURRENT MEDS:   . sodium chloride   Intravenous Once  . sodium chloride   Intravenous Once  . sodium chloride   Intravenous Once  . calcium-vitamin D  1 tablet Oral Q breakfast  . docusate sodium  100 mg Oral BID  . gabapentin  300 mg Oral BID WC  . gabapentin  600 mg Oral QHS  . levothyroxine  88 mcg Oral QHS  . metoprolol succinate  12.5 mg Oral Daily  . multivitamin with minerals  1 tablet Oral Daily  . pantoprazole  40 mg Oral Daily    [name] Beeper: [phone] Office: [phone] 04/16/2018

## 2018-04-16 NOTE — Progress Notes (Addendum)
Patient ID: [name], female   DOB: Dec 08, 1930, 85 y.o.   MRN: [id number]   BRIEF GI UPDATE: K+ 3.9.  Mg 1.9 -2.0. She tolerated Movi prep 6p dose without difficulty. She finished 3/4 of the midnight Movi prep dose. Some nausea, no vomiting. She reports passing clear slightly brown tinged water from the rectum last night and this am. One brief run of Vtach during the night. 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 Stanford Breed. Takes Metoprolol. Patient to proceed with colonoscopy today as scheduled.   ADDENDUM TO NOTE: I called floor RN to obtain rhythm strips, RN contacted the telemetry monitor who  reported  patient had a run of SVT NOT  VT  At 4am and 5am. Rhythm strips not scanned. I requested rhythm strips to be scanned for clarification.

## 2018-04-16 NOTE — Transfer of Care (Addendum)
Immediate Anesthesia Transfer of Care Note  Patient: [name]  Procedure(s) Performed: COLONOSCOPY WITH PROPOFOL (N/A ) BIOPSY  Patient Location: Endoscopy Unit  Anesthesia Type:MAC  Level of Consciousness: awake, alert  and oriented  Airway & Oxygen Therapy: Patient Spontanous Breathing and Patient connected to nasal cannula oxygen  Post-op Assessment: Report given to RN and Post -op Vital signs reviewed and stable  Post vital signs: Reviewed and stable  Last Vitals:  Vitals Value Taken Time  BP 133/64 1011AM  Temp    Pulse 66   Resp 22   SpO2 98%     Last Pain:  Vitals:   04/16/18 0807  TempSrc: Oral  PainSc: 0-No pain      Patients Stated Pain Goal: 2 (98/11/91 4782)  Complications: No apparent anesthesia complications

## 2018-04-16 NOTE — Anesthesia Postprocedure Evaluation (Signed)
Anesthesia Post Note  Patient: [name]  Procedure(s) Performed: COLONOSCOPY WITH PROPOFOL (N/A ) BIOPSY     Patient location during evaluation: Endoscopy Anesthesia Type: MAC Level of consciousness: awake and alert Pain management: pain level controlled Vital Signs Assessment: post-procedure vital signs reviewed and stable Respiratory status: spontaneous breathing, nonlabored ventilation, respiratory function stable and patient connected to nasal cannula oxygen Cardiovascular status: blood pressure returned to baseline and stable Postop Assessment: no apparent nausea or vomiting Anesthetic complications: no    Last Vitals:  Vitals:   04/16/18 1025 04/16/18 1043  BP: (!) 141/60 (!) 147/65  Pulse: 65 66  Resp: 20 17  Temp:  36.8 C  SpO2: 97% 97%    Last Pain:  Vitals:   04/16/18 1045  TempSrc:   PainSc: [place]

## 2018-04-17 LAB — CBC WITH DIFFERENTIAL/PLATELET
Abs Immature Granulocytes: 0.04 10*3/uL (ref 0.00–0.07)
Basophils Absolute: 0 10*3/uL (ref 0.0–0.1)
Basophils Relative: 0 %
Eosinophils Absolute: 0.1 10*3/uL (ref 0.0–0.5)
Eosinophils Relative: 1 %
HCT: 29.1 % — ABNORMAL LOW (ref 36.0–46.0)
Hemoglobin: 9.2 g/dL — ABNORMAL LOW (ref 12.0–15.0)
IMMATURE GRANULOCYTES: 1 %
Lymphocytes Relative: 8 %
Lymphs Abs: 0.6 10*3/uL — ABNORMAL LOW (ref 0.7–4.0)
MCH: 29 pg (ref 26.0–34.0)
MCHC: 31.6 g/dL (ref 30.0–36.0)
MCV: 91.8 fL (ref 80.0–100.0)
MONOS PCT: 14 %
Monocytes Absolute: 1.1 10*3/uL — ABNORMAL HIGH (ref 0.1–1.0)
NEUTROS PCT: 76 %
Neutro Abs: 5.9 10*3/uL (ref 1.7–7.7)
Platelets: 296 10*3/uL (ref 150–400)
RBC: 3.17 MIL/uL — ABNORMAL LOW (ref 3.87–5.11)
RDW: 14.4 % (ref 11.5–15.5)
WBC: 7.8 10*3/uL (ref 4.0–10.5)
nRBC: 0 % (ref 0.0–0.2)

## 2018-04-17 LAB — COMPREHENSIVE METABOLIC PANEL
ALT: 10 U/L (ref 0–44)
AST: 21 U/L (ref 15–41)
Albumin: 2.4 g/dL — ABNORMAL LOW (ref 3.5–5.0)
Alkaline Phosphatase: 57 U/L (ref 38–126)
Anion gap: 6 (ref 5–15)
BUN: 14 mg/dL (ref 8–23)
CHLORIDE: 97 mmol/L — AB (ref 98–111)
CO2: 29 mmol/L (ref 22–32)
Calcium: 8.1 mg/dL — ABNORMAL LOW (ref 8.9–10.3)
Creatinine, Ser: 1.01 mg/dL — ABNORMAL HIGH (ref 0.44–1.00)
GFR calc Af Amer: 59 mL/min — ABNORMAL LOW (ref >60)
GFR calc non Af Amer: 51 mL/min — ABNORMAL LOW (ref >60)
Glucose, Bld: 114 mg/dL — ABNORMAL HIGH (ref 70–99)
Potassium: 3.6 mmol/L (ref 3.5–5.1)
Sodium: 132 mmol/L — ABNORMAL LOW (ref 135–145)
Total Bilirubin: 0.8 mg/dL (ref 0.3–1.2)
Total Protein: 5.2 g/dL — ABNORMAL LOW (ref 6.5–8.1)

## 2018-04-17 LAB — PHOSPHORUS: Phosphorus: 3.1 mg/dL (ref 2.5–4.6)

## 2018-04-17 LAB — MAGNESIUM: Magnesium: 1.7 mg/dL (ref 1.7–2.4)

## 2018-04-17 MED ORDER — DOXYCYCLINE HYCLATE 100 MG PO TABS: 100.0000 mg | ORAL_TABLET | Freq: Two times a day (BID) | ORAL | 0 refills | Status: DC

## 2018-04-17 MED ORDER — FUROSEMIDE 20 MG PO TABS: 20.0000 mg | ORAL_TABLET | Freq: Every day | ORAL | Status: DC

## 2018-04-17 MED ORDER — SODIUM CHLORIDE 0.9 % IV SOLN: INTRAVENOUS | Status: DC

## 2018-04-17 MED ORDER — MAGNESIUM SULFATE 2 GM/50ML IV SOLN
2.0000 g | Freq: Once | INTRAVENOUS | Status: AC
  Administered 2018-04-17: 2 g via INTRAVENOUS
  Filled 2018-04-17: qty 50

## 2018-04-17 MED ORDER — DOXYCYCLINE HYCLATE 100 MG PO TABS
100.0000 mg | ORAL_TABLET | Freq: Two times a day (BID) | ORAL | Status: DC
  Administered 2018-04-17: 100 mg via ORAL
  Filled 2018-04-17: qty 1

## 2018-04-17 MED ORDER — SENNOSIDES-DOCUSATE SODIUM 8.6-50 MG PO TABS: 1.0000 | ORAL_TABLET | Freq: Every evening | ORAL | 0 refills | Status: DC | PRN

## 2018-04-17 NOTE — Progress Notes (Signed)
Pt discharged home with family. IV and telemetry box removed. Pt received discharge instructions and all questions were answered. Pt instructed to pick up prescriptions and her pharmacy. Pt verbalized understanding. Pt discharged with all of her belongings. Pt left via wheelchair and was accompanied by RN.

## 2018-04-17 NOTE — Progress Notes (Signed)
Pt ambulated 211ft in hallway using rolling walker. Standby assist provided. Pt tolerated well. Returned to recliner to get dressed for discharge.

## 2018-04-17 NOTE — Progress Notes (Signed)
Received call from RN. Pt has an order for [facility] RN, OT, PT and [place] aide. Contacted Katina at [place] at [facility] for referral. Alwyn Ren will call back to verify that they can take the referral.

## 2018-04-17 NOTE — Progress Notes (Signed)
Received call from [place] at [place] at [facility]. She accepted the referral. Met with pt and sister-in-law and made them aware of above.

## 2018-04-17 NOTE — Discharge Summary (Signed)
Physician Discharge Summary  [name] OVF:[id number] DOB: 1930-10-11 DOA: 04/09/2018  PCP: Darreld Mclean, MD  Admit date: 04/09/2018 Discharge date: 04/17/2018  Admitted From: Home Disposition: [place] with PT/OT/RN/Aide  Recommendations for Outpatient Follow-up:  1. Follow up with PCP in 1-2 weeks 2. Follow up with Vascular Surgery in 1-2 weeks and discuss with them about Anticoagulation plan or PCP about Anticoagulation plan 3. Follow up with Gastroenterology Dr. Rush Landmark in 4 weeks and repeat Anemia Panel then   4. Follow-up with Cardiology for nonsustained V. Tach in 1-2 weeks 5. Please obtain CMP/CBC, Mag Phos in one week 6. Please follow up on the following pending results:  Home Health: Yes  Equipment/Devices: None recommended by PT/OT     Discharge Condition: Stable  CODE STATUS: FULL CODE Diet recommendation: Heart Healthy Diet   Brief/Interim Summary: The patient is an 85 year old female with history of arthritis, GERD, hypothyroidism, LBBB who underwent right hip replacement 4 years ago, had revision of hip in October 2019 subsequently fell in November 2019 and had dislocation of the hip. At that time attempts were made to relocate the hip twice until she subsequently underwent revision of right hip on 01/24/2018. Since that time patient has swelling and pain in right groin. She was seen by PCP at that time ultrasound showed large pseudoaneurysm. 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 Also revealed occlusion of the right popliteal artery. She underwent repair of the aneurysm and embolectomy but subsequently hospital course has been complicated by Symptomatic Anemia so GI consulted and did EGD and Colonoscopy. EGD was unrevealing and colonoscopy report had 3 polyps removed. Hospitalization has further been complicated by Nonsustained [facility] which abated quickly and Right Leg  Cellulitis which improved with IV Ceftriaxone and so patient was transitioned to po Doxycycline. She had no further bleeding and Hb/Hct remained stable. Today her Na+ dropped slightly and Renal Fxn mildly worsened but it is due to the IV lasix administered yesterday. PT/OT evaluated and recommended home health PT and OT.  She improved and she was deemed medically stable to be discharged she will need to follow-up with PCP, vascular surgery, as well as gastroenterology in the outpatient setting.  Discharge Diagnoses:  Principal Problem:   Femoral artery pseudo-aneurysm, right (HCC) Active Problems:   Symptomatic anemia   Elevated cholesterol   Hyponatremia   Popliteal artery occlusion, right (HCC)   HTN (hypertension)  Large Right Femoral Artery Pseudoaneurysm s/p Repair POD7 -Seen on CTA of RLE and it was orginating in the common femoral artery  -Repeat Vascular Arterial Duplex showed no obvious evidence of pseudoanyerusm noted and no evidence of bleeding  -Vascular Surgery Following and stated ok for patient to mobilize -Pain Control with Acetaminophen 650 mg po/RC q6hprn Mild Pain, Hydrocodone-Acetaminophen 1 tab po q4hprn Severe Pain and IV Hydromorphone 0.5-1 mg IV q2hprn Severe Pain not amenable to po Medications; Resume Home Pain medications -Antiemetics with 4 mg IV Ondansetron q6hprn Nausea/Vomiting while hospitalized  -Further Care per Vascular Surgery   Right Popliteal and Tibial Artery Occlusion -CT with evidence of right popliteal artery occlusive emboli. -S/p Right Popliteal and Tibial Embolectomy POD6 -Had a small amount of bloddy drainage and Drain is out -Anticoagulation as per vascular surgery recommendations and she was getting Lovenox daily but ?Anticoagulation at D/C for Right Popliteal Emboli was not recommended by Vascular in the setting of her having GIB -Further Care per Vascular  Surgery as an outpatient and deferral to start Anticoagulation in the outpatient  setting   Symptomatic Anemia -Likely from acute blood loss from surgery and Heme Positive Stools/Melena after Surgery -Has a Hx of Taking NSAIDS -S/p Transfusion of 4 units of pRBC's and will order another 1 unit this AM -Hb/Hct Trending down and went from 8.6/27.0 -> 7.2/22.9; Now improved after transfusion and is now 9.2/29.1 -FOBT was positive in January.  -Gastroenterology consulted and patient to undergo EGD this AM; EGD did now show a clear cause of why the patient had anemia or bleeding and they are recommending CLD with Colonoscopy -Colonoscopy unfortunately unable to be done yesterday due to patient not being prepared but was done today -Colonoscopy report pending but per patient she had 3 polyps removed; GI advanced Diet  -C/w Pantoprazole 40 mg po Daily for now and Further Care per GI  Hyponatremia -Mild, Sodium was 132 this AM and dropped from 145 in the setting of Lasix Administration -Continue to Monitor and Trend and repeat CMP as an outpatient   Hypothyroidism -Last TSH was 3.84 on 02/18/2018 -Continue Levothyroxine 88 mcg po qHS.  Hypertension -Continue Metoprolol Succinate 12.5 mg po Daily -C/w PRN Hydralazine 5 mg q4h for SBP>160 -BP is now 141/80  Hyperkalemia -Mild, potassium was 5.3 and improved and is now 3.6 -Continue to Monitor and Repeat CMP as an outpatient   Acute Kidney Injury on CKD Stage 3 -Mild creatinine was elevated 1.85 and now is improved. -Baseline creatinine is around 1.2.  -BUN/Cr slightly worsened and went from 13/0.75 -> 14/1.01 in the setting of IV Lasix yesterday -Avoid Nephrotoxic Medications/Contrast Dyes if possible and resume Lasix in AM  -Continue to Monitor and repeat CMP in AM  Hyperglycemia -Patient's Blood Sugar has been ranging from 94-133 on Daily BMP's/CMP's -Hemoglobin A1c checked and was 5.3 -Continue to Monitor Blood Sugars carefully and if consistently elevated will place on Sensitive Novolog SSI AC -Follow up  with PCP as an outpatient   Constipation, likely Opioid Induced -C/w Bisacodyl 5 mg po Dailyprn for Moderate Consitpation -C/w Senna-Docusate 1 tab po qHSprn for Mild Constipation -C/w Miralax 17 grams po BID -Patient received a Bowel Prep for Colonoscopy -Follow up with PCP and GI as an outpatient   Non-Sustained VTACH -Had a 26 beat run during hospitalization  -Has a Hx of this and will need to continue to Monitor on Telemetry -C/w Home Metoprolol; Keep K+ >4.0 and Mag >2.0 -If happens further will consult Cardiology but did not happen again so can follow up with Primary Cardiologist as an outpatient   Right Leg Cellulitis, improving  -Has warmth and erythema along with significant swelling; Originially thought to be post op changes from embolectomy but daughter and patient feel it has been going on for over a month -R Leg Tibia/Fibula X-Ray showed "Postsurgical changes are seen in the soft tissues. Mild degenerative changes at the knee joint are noted. No acute fracture or dislocation is seen. No gross soft tissue abnormality is noted." -Right Leg Venous Duplex Negative for DVT -She is Afebrile and has No Leukocytosis -Started Empiric Abx with IV Ceftriaxone and have asked nurse to mark it. Erythema has started spreading yesterday but not as warm and erythema improved today  -Daughter states it has a hx of "Bubbling" -Continue to Monitor closely and watch clinical response to intervention  -Given 1x dose of IV 40 mg of Lasix given Right Leg Swelling with minimal improvement -Switched IV Ceftriaxone to po Doxycycline at  Discharge for 10 days total.   Discharge Instructions  Discharge Instructions    Call MD for:  difficulty breathing, headache or visual disturbances   Complete by:  As directed    Call MD for:  extreme fatigue   Complete by:  As directed    Call MD for:  hives   Complete by:  As directed    Call MD for:  persistant dizziness or light-headedness   Complete by:   As directed    Call MD for:  persistant nausea and vomiting   Complete by:  As directed    Call MD for:  redness, tenderness, or signs of infection (pain, swelling, redness, odor or green/yellow discharge around incision site)   Complete by:  As directed    Call MD for:  severe uncontrolled pain   Complete by:  As directed    Call MD for:  temperature >100.4   Complete by:  As directed    Diet - low sodium heart healthy   Complete by:  As directed    Discharge instructions   Complete by:  As directed    You were cared for by a hospitalist during your hospital stay. If you have any questions about your discharge medications or the care you received while you were in the hospital after you are discharged, you can call the unit and ask to speak with the hospitalist on call if the hospitalist that took care of you is not available. Once you are discharged, your primary care physician will handle any further medical issues. Please note that NO REFILLS for any discharge medications will be authorized once you are discharged, as it is imperative that you return to your primary care physician (or establish a relationship with a primary care physician if you do not have one) for your aftercare needs so that they can reassess your need for medications and monitor your lab values.  Follow up with PCP, Vascular Surgery, Gastroenterology, and Cardiology in the outpatient setting. Take all medications as prescribed. If symptoms change or worsen please return to the ED for evaluation   Increase activity slowly   Complete by:  As directed      Allergies as of 04/17/2018      Reactions   Amlodipine Rash   Prochlorperazine Edisylate Anaphylaxis   Compazine--- tongue swells and rash   Aspirin Other (See Comments)   nose bleeds   Cymbalta [duloxetine Hcl] Diarrhea, Nausea And Vomiting, Other (See Comments)   Increased blood pressure   Pamelor [nortriptyline Hcl] Diarrhea, Nausea Only   Increased Heart rate  and BP      Medication List    STOP taking these medications   meloxicam 7.5 MG tablet Commonly known as:  MOBIC     TAKE these medications   Biotin 1000 MCG tablet Take 1,000 mcg by mouth daily.   CALCIUM 500 + D 500-125 MG-UNIT Tabs Generic drug:  Calcium Carbonate-Vitamin D Take 1 tablet by mouth daily.   doxycycline 100 MG tablet Commonly known as:  VIBRA-TABS Take 1 tablet (100 mg total) by mouth every 12 (twelve) hours for 8 days. What changed:  when to take this   furosemide 20 MG tablet Commonly known as:  LASIX Take 1 tablet (20 mg total) by mouth daily. Start taking on:  April 18, 2018   gabapentin 300 MG capsule Commonly known as:  NEURONTIN TAKE 300MG  IN THE MORNING, 300MG  IN THE AFTERNOON, AND 600MG  AT BEDTIME. What changed:  See the  new instructions.   GLUCOSAMINE CHONDR 1500 COMPLX PO Take 1 capsule by mouth daily.   HYDROcodone-acetaminophen 5-325 MG tablet Commonly known as:  NORCO/VICODIN Take 1 tablet by mouth every 4 (four) hours as needed for severe pain.   levothyroxine 88 MCG tablet Commonly known as:  SYNTHROID, LEVOTHROID TAKE 1 TABLET (88 MCG TOTAL) BY MOUTH DAILY BEFORE BREAKFAST. What changed:    when to take this  additional instructions   methocarbamol 500 MG tablet Commonly known as:  ROBAXIN TAKE 1 TABLET (500 MG TOTAL) BY MOUTH EVERY 6 (SIX) HOURS AS NEEDED FOR MUSCLE SPASMS.   metoprolol succinate 25 MG 24 hr tablet Commonly known as:  TOPROL XL Take 0.5 tablets (12.5 mg total) by mouth daily.   multivitamin tablet Take 1 tablet by mouth daily.   omeprazole 20 MG capsule Commonly known as:  PRILOSEC TAKE 1 CAPSULE BY MOUTH 2 TIMES DAILY BEFORE A MEAL. What changed:  See the new instructions.   senna-docusate 8.6-50 MG tablet Commonly known as:  Senokot-S Take 1 tablet by mouth at bedtime as needed for mild constipation.   spironolactone 25 MG tablet Commonly known as:  ALDACTONE Take 12.5 mg by mouth every  morning.   STOOL SOFTENER 100 MG capsule Generic drug:  docusate sodium Take 100 mg by mouth 2 (two) times daily.       Allergies  Allergen Reactions  . Amlodipine Rash  . Prochlorperazine Edisylate Anaphylaxis    Compazine--- tongue swells and rash  . Aspirin Other (See Comments)    nose bleeds  . Cymbalta [Duloxetine Hcl] Diarrhea, Nausea And Vomiting and Other (See Comments)    Increased blood pressure  . Pamelor [Nortriptyline Hcl] Diarrhea and Nausea Only    Increased Heart rate and BP    Consultations:  Vascular Surgery  Gastroenterology  Procedures/Studies: Dg Tibia/fibula Right  Result Date: 04/15/2018 CLINICAL DATA:  Changes of cellulitis EXAM: RIGHT TIBIA AND FIBULA - 2 VIEW COMPARISON:  None. FINDINGS: Postsurgical changes are seen in the soft tissues. Mild degenerative changes at the knee joint are noted. No acute fracture or dislocation is seen. No gross soft tissue abnormality is noted. IMPRESSION: Mild degenerative changes about the knee joint. No acute abnormality is noted. Electronically Signed   By: Inez Catalina M.D.   On: 04/15/2018 13:52   Ct Pelvis Wo Contrast  Result Date: 04/12/2018 CLINICAL DATA:  Right leg and groin swelling, common femoral artery pseudoaneurysm repair 2 days ago EXAM: CT PELVIS WITHOUT CONTRAST TECHNIQUE: Multidetector CT imaging of the pelvis was performed following the standard protocol without intravenous contrast. IV contrast could not be administered due to low GFR. COMPARISON:  Ultrasound of 04/09/2018 FINDINGS: Urinary Tract:  Grossly unremarkable Bowel:  Grossly unremarkable Vascular/Lymphatic: Aortoiliac atherosclerotic vascular disease. The right external iliac vessels and common femoral vessels are obscured by streak artifact from the right hip implant. There is a surgical drain and running just lateral to the right common femoral artery, with some indistinctness of the margins of the common femoral vessels. Reproductive: Region  obscured by streak artifact from the hip implants. Other:  No supplemental non-categorized findings. Musculoskeletal: There is a considerable amount of soft tissue prominence along the right iloipsoas and gluteal musculature as well as around the right hip joint especially anteriorly. This extends along the proximal vastus musculature, and a component of hemarthrosis is not excluded. There is a tiny locule of gas in the vicinity of the indistinct right rectus femoris muscle on image 41/10. There are  small locules of gas along the right lower quadrant anterior abdominal wall musculature which appears thickened. I expect that much of this appearance is postoperative given the required retroperitoneal exposure of the right external iliac artery. There is potentially some hematoma along the right pelvic sidewall for example on image 31/10, partially obscured by the streak artifact from the hip hardware. Suspected right anterior acetabular stress fracture shown on images 111 through 99 of series 5. Known fracture of the right greater trochanter observed with posterior fragments. Prominent dextroconvex lumbar scoliosis with chronic bilateral pars defects at L5 and 10 mm of anterolisthesis of L5 on S1. Chronic superior endplate compression at L3. Left acetabular labral and pubic symphysis chondrocalcinosis. IMPRESSION: 1. Soft tissue density favoring hematoma primarily along the right anterior hip and groin region, particularly along the right iloipsoas and anterior gluteal musculature as well as around the right hip joint and proximal vastus musculature. A component of hemarthrosis is a possibility. It is difficult to know how much of this may be the result of the prior pseudoaneurysm that was repaired, versus continuing hematoma. I reviewed this case in person with Dr. Gae Gallop and we discussed the utility of performing a Doppler ultrasound to assess for defect or leak from the vessel previously repaired.  Unfortunately, CT angiography is not feasible due to the patient's GFR, and the hardware from the hip implant obscures the surrounding soft tissues. 2. There is some postoperative findings in the region including a small amount of gas loculation and thickening/hematoma tracking along the right lower anterior abdominal wall musculature, not unexpected in the setting of prior retroperitoneal exposure of the right external iliac artery. 3. Suspected stress fracture of the right upper anterior acetabulum, best shown on the coronal multilobar matted images. There is also a subacute fracture of the greater trochanter which is a known finding based on prior imaging and operative notes. 4. A drain is present in the right groin region extending just lateral to the common femoral vasculature. 5. Prominent lumbar scoliosis. 6. Chronic pars defects at L5 with 10 mm anterolisthesis of L5 on S1. 7. Chronic superior endplate compression at L3 8. 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 Electronically Signed   By: Van Clines M.D.   On: 04/12/2018 07:53   Ct Angio Low Extrem Right W &/or Wo Contrast  Result Date: 04/09/2018 CLINICAL DATA:  History of right hip dislocation with subsequent reduction 01/24/2018 now with indeterminate vascular structure within the right groin worrisome for pseudoaneurysm. EXAM: CT ANGIOGRAPHY OF THE RIGHT LOWEREXTREMITY TECHNIQUE: Multidetector CT imaging of the right lower was performed using the standard protocol during bolus administration of intravenous contrast. Multiplanar CT image reconstructions and MIPs were obtained to evaluate the vascular anatomy. CONTRAST:  165mL ISOVUE-370 IOPAMIDOL (ISOVUE-370) INJECTION 76% COMPARISON:  Right lower extremity venous Doppler ultrasound-earlier same day FINDINGS: Vascular Findings: Abdominal aorta: Minimal amount within a tortuous but of calcified atherosclerotic plaque normal caliber abdominal aorta. IMA: Appears patent.  -------------------------------------------------------------------------------- RIGHT LOWER EXTREMITY VASCULATURE: Right-sided pelvic vasculature: The right common, external and internal iliac arteries are of normal caliber and widely patent without a hemodynamically significant narrowing. Right common femoral artery: Widely patent. There is mass effect upon the right common femoral artery secondary to a large (approximately 6.5 x 5.4 x 3.0 cm) pseudoaneurysm adjacent to the lateral distal aspect right common femoral artery (coronal image 53, series 6; axial image 67, series 4). The neck of the pseudoaneurysm measures approximately 0.3 x 0.3 cm (coronal image 49, series  6; axial image 69, series 4). There is suspect surrounding non-opacified hematoma though this is suboptimally evaluated secondary to streak artifact from the patient's right total hip prostheses. Right deep femoral artery: Widely patent without hemodynamically significant narrowing. Right superficial femoral artery: Widely patent without hemodynamically significant narrowing. Right popliteal artery: The right above is normal caliber and widely patent without hemodynamically significant narrowing. There is abrupt occlusion of the distal aspect of the right below-knee popliteal artery (image 214, series 4). Right lower leg: As above, there is abrupt occlusion of the distal aspect the right below-knee popliteal artery. There is reconstitution involving the proximal aspect of the right anterior tibial artery (image 222, series 4) and tibioperoneal trunk (image 226, series 4) via infra geniculate collaterals. The peroneal artery becomes atretic at the level of the ankle mortise. There is short-segment occlusion of the right posterior tibial artery at the level of the hindfoot (image 329, series 4), with early reconstitution. The right anterior tibial artery gives rise to dorsalis pedis artery which appears patent to the level of the forefoot. Review of  the MIP images confirms the above findings. -------------------------------------------------------------------------------- Nonvascular Findings: Diffuse anasarca about left leg and thigh. Post right total hip replacement without evidence of hardware failure or loosening. 2 of the anchoring acetabular screws course through the medial wall the right hemipelvis and terminate within the right iliacus and iliopsoas musculature. Normal appearance of the right kidney. Imaged loops of bowel appear normal. Normal appearance of the urinary bladder. No free fluid in the pelvic cul-de-sac. IMPRESSION: 1. 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 The neck of the aneurysm measures approximately 3 mm in diameter. 2. Short-segment occlusive emboli within the distal aspect of the right popliteal artery extending to involve the proximal aspects of the right anterior tibial artery and the tibioperoneal trunk. 3. Short-segment occlusive embolism within the right popliteal artery at the level of the hindfoot. Critical Value/emergent results were called by telephone at the time of interpretation on 04/09/2018 at 4:39 pm to Dr. Gustavus Messing, who verbally acknowledged these results. Electronically Signed   By: Sandi Mariscal M.D.   On: 04/09/2018 16:48   US Venous Img Lower Unilateral Right  Result Date: 04/09/2018 CLINICAL DATA:  Right lower extremity pain and edema since undergoing hip surgery in December (dislocation and subsequent reduction of right total hip prosthesis). History of lung cancer. Evaluate for DVT. EXAM: RIGHT LOWER EXTREMITY VENOUS DOPPLER ULTRASOUND TECHNIQUE: Gray-scale sonography with graded compression, as well as color Doppler and duplex ultrasound were performed to evaluate the lower extremity deep venous systems from the level of the common femoral vein and including the common femoral, femoral, profunda femoral, popliteal and calf veins including the  posterior tibial, peroneal and gastrocnemius veins when visible. The superficial great saphenous vein was also interrogated. Spectral Doppler was utilized to evaluate flow at rest and with distal augmentation maneuvers in the common femoral, femoral and popliteal veins. COMPARISON:  Right hip radiographs-01/24/2018 (multiple examinations). FINDINGS: Contralateral Common Femoral Vein: Respiratory phasicity is normal and symmetric with the symptomatic side. No evidence of thrombus. Normal compressibility. Common Femoral Vein: No evidence of thrombus. Normal compressibility, respiratory phasicity and response to augmentation. Saphenofemoral Junction: No evidence of thrombus. Normal compressibility and flow on color Doppler imaging. Profunda Femoral Vein: No evidence of thrombus. Normal compressibility and flow on color Doppler imaging. Femoral Vein: No evidence of thrombus. Normal compressibility, respiratory phasicity and response to augmentation. Popliteal Vein: No evidence of thrombus.  Normal compressibility, respiratory phasicity and response to augmentation. Calf Veins: No evidence of thrombus. Normal compressibility and flow on color Doppler imaging. Superficial Great Saphenous Vein: No evidence of thrombus. Normal compressibility. Venous Reflux:  None. Other Findings: There is a large (at least 9.9 x 7.1 x 3.2 cm) fluid collection within the right groin which contains internal pulsatile blood flow (image 12) worrisome for either a pseudoaneurysm (favored) or AV fistula. Note is made of an approximately 3.3 x 0.7 x 1.7 cm anechoic fluid collection with the right popliteal fossa compatible with a Baker's cyst. IMPRESSION: 1. No evidence of DVT within the right lower extremity. 2. Large (at least 9.9 cm) complex fluid collection within the right groin which contains internal blood flow worrisome for either a pseudoaneurysm (favored) or AV fistula, potentially the sequela of recent left total hip dislocation and  subsequent reduction. Further evaluation with CTA run-off could be performed as clinically indicated. Critical Value/emergent results were called by telephone at the time of interpretation on 04/09/2018 at 11:34 am to Dr. Evern Core, who verbally acknowledged these results. Electronically Signed   By: Sandi Mariscal M.D.   On: 04/09/2018 12:01   Vas [place] Lower Extremity Arterial Duplex  Result Date: 04/12/2018 LOWER EXTREMITY ARTERIAL DUPLEX STUDY Indications: Post Pseudoaneurysm repair.  Current ABI: n/a Comparison Study: Prior study from 04/09/18 is available Performing Technologist: Sharion Dove RVS  Examination Guidelines: A complete evaluation includes B-mode imaging, spectral Doppler, color Doppler, and power Doppler as needed of all accessible portions of each vessel. Bilateral testing is considered an integral part of a complete examination. Limited examinations for reoccurring indications may be performed as noted.  Summary: Right: No obvious evidence of pseudoaneurysm noted.  See table(s) above for measurements and observations. Electronically signed by Ruta Hinds MD on 04/12/2018 at 3:33:30 PM.    Final     EGD Findings: Esophagogastric landmarks were identified: the Z-line was found at 28  cm, the gastroesophageal junction was found at 28 cm and the upper  extent of the gastric folds was found at 35 cm from the incisors. A 7 cm hiatal hernia was present. Within the hernia sac were 2 small  erythematous areas but no obvious ulceration or erosion. The examined esophagus was tortuous. Localized focal area of nodularity was found in the cardia just inferior  to the GEJ within the hernia sac. I suspect benign inflammatory change  however biopsies were taken with a cold forceps for histology. The exam of the stomach was otherwise normal. The examined duodenum was normal. Impression: - Esophagogastric landmarks  identified. - 7 cm hiatal hernia without any ulceration /  erosion or stigmata for bleeding - Tortuous esophagus. - Nodular mucosa in the cardia as above. Biopsied. - Normal examined duodenum. No clear cause for patient's anemia / bleeding  noted on EGD. Hiatal hernia is large and at risk  for [facility] ulceration but none overtly seen.  COLONOSCOPY Findings:      Hemorrhoids were found on perianal exam.      The terminal ileum and ileocecal valve appeared normal.      The colon (entire examined portion) was significantly tortuous.      Three sessile polyps were found in the transverse colon. The polyps were       1 to 2 mm in size. These polyps were removed with a cold biopsy forceps.       Resection and retrieval were complete.      Non-bleeding non-thrombosed internal hemorrhoids were found  during       retroflexion, during perianal exam and during digital exam. The       hemorrhoids were Grade II (internal hemorrhoids that prolapse but reduce       spontaneously). Impression:               - Hemorrhoids found on perianal exam.                           - The examined portion of the ileum was normal.                           - Tortuous colon.                           - Three 1 to 2 mm polyps in the transverse colon,                            removed with a cold biopsy forceps. Resected and                            retrieved.                           - Non-bleeding non-thrombosed internal hemorrhoids.   Subjective: Seen and examined at bedside and was doing well. No Nausea or vomiting. No lightheadedness or dizziness. Felt as if leg got better. No other concerns or complaints at this time and worked well with PT/OT again prior to D/C. Will follow up with PCP, Cardiology,  Vascular Surgery, and Gastroenterology in the outpatient setting.   Discharge Exam: Vitals:   04/16/18 1925 04/17/18 0539  BP: 116/61 128/67  Pulse: 93 72  Resp: (!) 21 19  Temp: 99.1 F (37.3 C) 98.4 F (36.9 C)  SpO2: 95% 94%   Vitals:   04/16/18 1300 04/16/18 1600 04/16/18 1925 04/17/18 0539  BP:  132/69 116/61 128/67  Pulse: 98 93 93 72  Resp: (!) 22 (!) 21 (!) 21 19  Temp:  98.7 F (37.1 C) 99.1 F (37.3 C) 98.4 F (36.9 C)  TempSrc:  Oral Oral Oral  SpO2: 97% 99% 95% 94%  Weight:      Height:       General: Pt is alert, awake, not in acute distress Cardiovascular: RRR, S1/S2 +, no rubs, no gallops Respiratory: Diminished bilaterally, no wheezing, no rhonchi Abdominal: Soft, NT, ND, bowel sounds + Extremities: Right Leg had 1+ Edema and warmth is improved. no cyanosis; Good Pulse  The results of significant diagnostics from this hospitalization (including imaging, microbiology, ancillary and laboratory) are listed below for reference.     Microbiology: Recent Results (from the past 240 hour(s))  MRSA PCR Screening     Status: None   Collection Time: 04/09/18 11:46 PM  Result Value Ref Range Status   MRSA by PCR NEGATIVE NEGATIVE Final    Comment:        The GeneXpert MRSA Assay (FDA approved for NASAL specimens only), is one component of a comprehensive MRSA colonization surveillance program. It is not intended to diagnose MRSA infection nor to guide or monitor treatment for MRSA infections. Performed at [facility], [place] [address], [place], [place] [address]  Labs: BNP (last 3 results) No results for input(s): BNP in the last 8760 hours. Basic Metabolic Panel: Recent Labs  Lab 04/14/18 0313 04/15/18 0255 04/15/18 1614 04/16/18 0332 04/17/18 0659  NA 135 134* 135 135 132*  K 4.9 4.7 4.3 3.9 3.6  CL 104 101 99 101 97*  CO2 26 26 28 26 29   GLUCOSE 133* 95 122* 92 114*  BUN 23 16 15 13 14   CREATININE 1.10* 0.82 0.77 0.75 1.01*   CALCIUM 8.3* 8.2* 8.1* 8.1* 8.1*  MG 2.1 2.0 2.0 1.9 1.7  PHOS 2.6 3.5  --  3.4 3.1   Liver Function Tests: Recent Labs  Lab 04/11/18 0020 04/15/18 0255 04/16/18 0332 04/17/18 0659  AST 19 19 20 21   ALT 11 10 11 10   ALKPHOS 35* 41 47 57  BILITOT 0.7 1.0 1.1 0.8  PROT 4.8* 5.0* 5.1* 5.2*  ALBUMIN 2.6* 2.4* 2.4* 2.4*   No results for input(s): LIPASE, AMYLASE in the last 168 hours. No results for input(s): AMMONIA in the last 168 hours. CBC: Recent Labs  Lab 04/13/18 1241 04/14/18 0313 04/15/18 0255 04/16/18 0332 04/17/18 0659  WBC 7.2 6.7 7.4 7.7 7.8  NEUTROABS  --   --  5.5 5.9 5.9  HGB 8.6* 7.2* 8.8* 8.8* 9.2*  HCT 27.0* 22.9* 27.1* 26.9* 29.1*  MCV 90.3 91.6 90.3 90.9 91.8  PLT 187 208 215 270 296   Cardiac Enzymes: No results for input(s): CKTOTAL, CKMB, CKMBINDEX, TROPONINI in the last 168 hours. BNP: Invalid input(s): POCBNP CBG: No results for input(s): GLUCAP in the last 168 hours. D-Dimer No results for input(s): DDIMER in the last 72 hours. Hgb A1c Recent Labs    04/15/18 0255  HGBA1C 5.3   Lipid Profile No results for input(s): CHOL, HDL, LDLCALC, TRIG, CHOLHDL, LDLDIRECT in the last 72 hours. Thyroid function studies No results for input(s): TSH, T4TOTAL, T3FREE, THYROIDAB in the last 72 hours.  Invalid input(s): FREET3 Anemia work up No results for input(s): VITAMINB12, FOLATE, FERRITIN, TIBC, IRON, RETICCTPCT in the last 72 hours. Urinalysis    Component Value Date/Time   COLORURINE YELLOW 07/29/2014 1037   APPEARANCEUR CLEAR 07/29/2014 1037   LABSPEC 1.013 07/29/2014 1037   PHURINE 7.5 07/29/2014 1037   GLUCOSEU NEGATIVE 07/29/2014 1037   HGBUR NEGATIVE 07/29/2014 1037   HGBUR negative 10/01/2009 1516   BILIRUBINUR negative 07/28/2016 1453   KETONESUR NEGATIVE 07/29/2014 1037   PROTEINUR 0.15 07/28/2016 1453   PROTEINUR NEGATIVE 07/29/2014 1037   UROBILINOGEN 0.2 07/28/2016 1453   UROBILINOGEN 0.2 07/29/2014 1037   NITRITE  negative 07/28/2016 1453   NITRITE NEGATIVE 07/29/2014 1037   LEUKOCYTESUR Negative 07/28/2016 1453   Sepsis Labs Invalid input(s): PROCALCITONIN,  WBC,  LACTICIDVEN Microbiology Recent Results (from the past 240 hour(s))  MRSA PCR Screening     Status: None   Collection Time: 04/09/18 11:46 PM  Result Value Ref Range Status   MRSA by PCR NEGATIVE NEGATIVE Final    Comment:        The GeneXpert MRSA Assay (FDA approved for NASAL specimens only), is one component of a comprehensive MRSA colonization surveillance program. It is not intended to diagnose MRSA infection nor to guide or monitor treatment for MRSA infections. Performed at [facility], [place] [address], [place], [place] [address]    Time coordinating discharge: 35 minutes  SIGNED:  Kerney Elbe, DO [facility] 04/17/2018, 9:24 AM Pager is on [place]  If 7PM-7AM, please contact night-coverage [email or web address]  Password [email or web address]

## 2018-04-18 ENCOUNTER — Encounter: Payer: Self-pay | Admitting: Family Medicine

## 2018-04-18 ENCOUNTER — Encounter (HOSPITAL_COMMUNITY): Payer: Self-pay | Admitting: Gastroenterology

## 2018-04-19 ENCOUNTER — Telehealth: Payer: Self-pay

## 2018-04-19 ENCOUNTER — Telehealth: Payer: Self-pay | Admitting: *Deleted

## 2018-04-19 ENCOUNTER — Encounter: Payer: Self-pay | Admitting: Gastroenterology

## 2018-04-19 NOTE — Telephone Encounter (Signed)
Copied from [place] [id number]. Topic: Quick Communication - Home Health Verbal Orders >> Apr 19, 2018 12:14 PM Yvette Rack wrote: Caller/Agency: Darlina Guys with [facility] Number: [phone] Requesting OT/PT/Skilled Nursing/Social Work: skilled nursing  Frequency: Will be going out on 04/20/18 to start nursing services

## 2018-04-19 NOTE — Telephone Encounter (Signed)
Per the appt list-this patient cancelled her appt with Dr. Bryan Lemma for 03/26/2018 but has since been seen by Dr. Havery Moros and Dr. Verna Czech. Bryan Lemma do you agree with these recommendations or would you like for this patient to be scheduled with you?  and have these orders placed?  Please advise

## 2018-04-19 NOTE — Telephone Encounter (Signed)
Bri I believe this is the person I told you about that was switched to Dr Bryan Lemma.

## 2018-04-19 NOTE — Telephone Encounter (Signed)
Transition Care Management Follow-up Telephone Call   Date discharged?04/17/18   How have you been since you were released from the hospital? better   Do you understand why you were in the hospital? yes   Do you understand the discharge instructions? yes   Where were you discharged to? home   Items Reviewed:  Medications reviewed: pt states she is on a new antibiotic and will bring list of meds to appt.  Allergies reviewed: yes  Dietary changes reviewed: yes  Referrals reviewed: yes   Functional Questionnaire:   Activities of Daily Living (ADLs):   She states they are independent in the following: feeding, continence, grooming and toileting States they require assistance with the following: ambulation, bathing and hygiene, dressing and pt has caregiver to assist with needs and uses rolling walker.   Any transportation issues/concerns?: no   Any patient concerns? Pt wants to know if [facility] RN can draw her labs so she doesn't have to come to office   Pt declines to book appointment at this time.  Confirmed with patient if condition begins to worsen call PCP or go to the ER.  Patient was given the office number and encouraged to call back with question or concerns.  : yes

## 2018-04-20 LAB — CBC AND DIFFERENTIAL
HCT: 33 — AB (ref 36–46)
Hemoglobin: 10.9 — AB (ref 12.0–16.0)
Platelets: 407 — AB (ref 150–399)
WBC: 6.4

## 2018-04-20 NOTE — Telephone Encounter (Signed)
Yes please. Thank you

## 2018-04-20 NOTE — Telephone Encounter (Signed)
No problem. Looks like she was seen by Drs. [place] as an inpatient, not outpatient. Therefore, I am perfectly happy to see her in clinic for hospital follow-up if that is her preference due to location/geopgraphy. If she prefers to be seen by one of the docs who saw her as an inpatient, that is ok as well. Thanks.

## 2018-04-20 NOTE — Telephone Encounter (Signed)
Called and spoke with patient-patient informed of MD recommendation for an OV for follow up post hopsitalization for large hiatal hernia, anemia, and heme + and dark stools (post EGD and colonoscopy); patient has been scheduled with Dr. Bryan Lemma on 05/25/2018 arrival at 10:45am for an 11:00am appt; patient is aware of appt date/time and will call back to the office should questions/concerns arise or if appt needs to be changed; patient verbalized understanding of information/instructions;   Dr. Loletha Grayer, do you want the labs that Dr. Rush Landmark recommended to be ordered for this patient to complete one week prior to her appt with you? Please advise

## 2018-04-20 NOTE — Telephone Encounter (Signed)
Verbal orders given  

## 2018-04-21 ENCOUNTER — Encounter: Payer: Self-pay | Admitting: Family Medicine

## 2018-04-21 ENCOUNTER — Other Ambulatory Visit: Payer: Self-pay

## 2018-04-21 ENCOUNTER — Ambulatory Visit: Payer: Medicare Other | Admitting: Family Medicine

## 2018-04-21 VITALS — BP 118/66 | HR 82 | Temp 97.7°F | Resp 18 | Ht 62.0 in

## 2018-04-21 DIAGNOSIS — R600 Localized edema: Secondary | ICD-10-CM

## 2018-04-21 DIAGNOSIS — I70201 Unspecified atherosclerosis of native arteries of extremities, right leg: Secondary | ICD-10-CM | POA: Diagnosis not present

## 2018-04-21 DIAGNOSIS — Z79899 Other long term (current) drug therapy: Secondary | ICD-10-CM | POA: Diagnosis not present

## 2018-04-21 DIAGNOSIS — L03115 Cellulitis of right lower limb: Secondary | ICD-10-CM

## 2018-04-21 DIAGNOSIS — I472 Ventricular tachycardia, unspecified: Secondary | ICD-10-CM

## 2018-04-21 DIAGNOSIS — D62 Acute posthemorrhagic anemia: Secondary | ICD-10-CM

## 2018-04-21 MED ORDER — BISMUTH SUBSALICYLATE 262 MG PO CHEW: 524.0000 mg | CHEWABLE_TABLET | Freq: Four times a day (QID) | ORAL | 0 refills | Status: DC

## 2018-04-21 MED ORDER — DOXYCYCLINE HYCLATE 100 MG PO TABS: 100.0000 mg | ORAL_TABLET | Freq: Two times a day (BID) | ORAL | 0 refills | Status: AC

## 2018-04-21 MED ORDER — METRONIDAZOLE 500 MG PO TABS: 500.0000 mg | ORAL_TABLET | Freq: Three times a day (TID) | ORAL | 0 refills | Status: DC

## 2018-04-21 NOTE — Progress Notes (Addendum)
[facility] [address], [place], [place] [address] [phone]  Date:  04/21/2018   Name:  [name]   DOB:  13-Oct-1930   MRN:  [id number]  PCP:  Darreld Mclean, MD    Chief Complaint: Hospitalization Follow-up (follow up on blood clot in right leg and blood tranfusions.)    History of Present Illness:  [name] is a 85 y.o. very pleasant female patient who presents with the following:  Following up from recent hospital stay, she is accompanied by her daughter [name] today  Admit date: 04/09/2018 Discharge date: 04/17/2018  Disposition: Home Health with PT/OT/RN/Aide  Recommendations for Outpatient Follow-up:  1. Follow up with PCP in 1-2 weeks 2. Follow up with Vascular Surgery in 1-2 weeks and discuss with them about Anticoagulation plan or PCP about Anticoagulation plan 3. Follow up with Gastroenterology Dr. Rush Landmark in 4 weeks and repeat Anemia Panel then   4. Follow-up with Cardiology for nonsustained V. Tach in 1-2 weeks 5. Please obtain CMP/CBC, Mag Phos in one week 6. Please follow up on the following pending results:  Home Health: Yes  Equipment/Devices: None recommended by PT/OT     Brief/Interim Summary: The patient is an 85 year old female with history of arthritis, GERD, hypothyroidism, LBBB who underwent right hip replacement 4 years ago, had revision of hip in October 2019 subsequently fell in November 2019 and had dislocation of the hip. At that time attempts were made to relocate the hip twice until she subsequently underwent revision of right hip on 01/24/2018. Since that time patient has swelling and pain in right groin. She was seen by PCP at that time ultrasound showed large pseudoaneurysm. 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 Also revealed occlusion of the right popliteal artery. She  underwent repair of the aneurysm and embolectomy but subsequently hospital course has been complicated by Symptomatic Anemia so GI consulted and did EGD and Colonoscopy. EGD was unrevealing and colonoscopy report had 3 polyps removed. Hospitalization has further been complicated by Nonsustained [facility] which abated quickly and Right Leg Cellulitis which improved with IV Ceftriaxone and so patient was transitioned to po Doxycycline. She had no further bleeding and Hb/Hct remained stable. Today her Na+ dropped slightly and Renal Fxn mildly worsened but it is due to the IV lasix administered yesterday. PT/OT evaluated and recommended home health PT and OT.  She improved and she was deemed medically stable to be discharged she will need to follow-up with PCP, vascular surgery, as well as gastroenterology in the outpatient setting.  Discharge Diagnoses:  Principal Problem:   Femoral artery pseudo-aneurysm, right (HCC) Active Problems:   Symptomatic anemia   Elevated cholesterol   Hyponatremia   Popliteal artery occlusion, right (HCC)   HTN (hypertension)  Large Right Femoral Artery Pseudoaneurysm s/p Repair POD7 -Seen on CTA of RLE and it was orginating in the common femoral artery  -Repeat Vascular Arterial Duplex showed no obvious evidence of pseudoanyerusm noted and no evidence of bleeding  -Vascular Surgery Following and stated ok for patient to mobilize -Pain Control with Acetaminophen 650 mg po/RC q6hprn Mild Pain, Hydrocodone-Acetaminophen 1 tab po q4hprn Severe Pain and IV Hydromorphone 0.5-1 mg IV q2hprn Severe Pain not amenable to po Medications; Resume Home Pain medications -Antiemetics with 4 mg IV Ondansetron q6hprn Nausea/Vomiting while hospitalized  -Further Care per Vascular Surgery  Right Popliteal and Tibial Artery Occlusion -CT with evidence of right popliteal artery occlusive emboli. -S/p Right Popliteal and Tibial Embolectomy POD6 -Had a small amount of bloddy drainage and  Drain is out -Anticoagulation as per vascular surgery recommendations and she was getting Lovenox daily but ?Anticoagulation at D/C for Right Popliteal Emboli was not recommended by Vascular in the setting of her having GIB -Further Care per Vascular Surgery as an outpatient and deferral to start Anticoagulation in the outpatient setting   Symptomatic Anemia -Likely from acute blood loss from surgery and Heme Positive Stools/Melena after Surgery -Has a Hx of Taking NSAIDS -S/p Transfusion of 4 units of pRBC's and will order another 1 unit this AM -Hb/Hct Trending down and went from 8.6/27.0 ->7.2/22.9; Now improved after transfusion and is now 9.2/29.1 -FOBT was positive in January.  -Gastroenterology consulted and patient to undergo EGD this AM;EGDdid now show a clear cause of why the patient had anemia or bleeding and they are recommendingCLD with Colonoscopy -Colonoscopy unfortunately unable to be done yesterday due to patient not being prepared but was done today -Colonoscopyreport pending but per patient she had 3 polyps removed; GI advanced Diet -C/w Pantoprazole 40 mg po Dailyfor now and Further Care per GI  Hyponatremia -Mild, Sodium was 132 this AM and dropped from 145 in the setting of Lasix Administration -Continue to Monitor and Trend and repeat CMP as an outpatient  Hypothyroidism -Last TSH was 3.84 on 02/18/2018 -Continue Levothyroxine 88 mcg po qHS. Hypertension -Continue Metoprolol Succinate 12.5 mg po Daily -C/w PRN Hydralazine 5 mg q4h for SBP>160 -BP is now 141/80 Hyperkalemia -Mild, potassium was 5.3 and improved and is now3.6 -Continue to Monitor and Repeat CMP as an outpatient  Acute Kidney Injury on CKD Stage 3 -Mild creatinine was elevated 1.85 and now is improved. -Baseline creatinine is around 1.2.  -BUN/Cr slightly worsened and went from 13/0.75 -> 14/1.01 in the setting of IV Lasix yesterday -Avoid Nephrotoxic Medications/Contrast Dyes if possible  and resume Lasix in AM  -Continue to Monitor and repeat CMP in AM Hyperglycemia -Patient's Blood Sugar has been ranging from 94-133 on Daily BMP's/CMP's -Hemoglobin A1c checked and was 5.3 -Continue to Monitor Blood Sugars carefully and if consistently elevated will place on Sensitive Novolog SSI AC -Follow up with PCP as an outpatient  Constipation, likely Opioid Induced -C/w Bisacodyl 5 mg po Dailyprn for Moderate Consitpation -C/w Senna-Docusate 1 tab po qHSprn for Mild Constipation -C/w Miralax 17 grams po BID -Patient received a Bowel Prep for Colonoscopy -Follow up with PCP and GI as an outpatient  Non-Sustained VTACH -Had a 26 beat runduring hospitalization  -Has a Hx of this and will need to continue to Monitor on Telemetry -C/w Home Metoprolol; Keep K+ >4.0 and Mag >2.0 -If happens further will consult Cardiology but did not happen again so can follow up with Primary Cardiologist as an outpatient  Right Leg Cellulitis, improving  -Has warmth and erythema along with significant swelling; Originially thought to be post op changes from embolectomy but daughter and patient feel it has been going on for over a month -R Leg Tibia/Fibula X-Rayshowed "Postsurgical changes are seen in the soft tissues. Mild degenerative changes at the knee joint are noted. No acute fracture or dislocation is seen. No gross soft tissue abnormality is noted." -Right Leg Venous Duplex Negative for DVT -She is Afebrile and has No Leukocytosis -Started Empiric Abx with IV Ceftriaxone and have asked nurse to mark it.Erythema has started spreading yesterday but  not as warm and erythema improved today  -Daughter states it has a hx of "Bubbling" -Continue to Monitor closely and watch clinical response to intervention -Given 1x dose of IV 40 mg of Lasix given Right Leg Swelling with minimal improvement -Switched IV Ceftriaxone to po Doxycycline at Discharge for 10 days total.   Jacaria feels that she is doing  well and making progress.  Physical therapy will be out to work with her later this week She had just finished her course of doxycycline, but then her H. pylori came back positive.  GI started her on a course of treatment which includes another week of doxycycline She had both upper and lower GI while inpatient, no evidence of a significant GI bleed  Her weight is still up from normal, but we think this is from significant edema in her right leg. Her leg is worse as the day goes on, gets better overnight.  She is trying to keep the leg propped up as much as possible No fever noted She plans to follow-up with vascular surgery and cardiology, as well as gastroenterology  Wt Readings from Last 3 Encounters:  04/16/18 112 lb (50.8 kg)  04/07/18 113 lb (51.3 kg)  03/31/18 108 lb 1.9 oz (49 kg)   Her home weight yesterday was 120 No SOB  Patient Active Problem List   Diagnosis Date Noted  . Popliteal artery occlusion, right ([place]) 04/10/2018  . HTN (hypertension) 04/10/2018  . Femoral artery pseudo-aneurysm, right ([place]) 04/09/2018  . Anemia of chronic disease 02/24/2018  . Unstable right hip arthroplasty 01/24/2018  . History of revision of total replacement of right hip joint 01/24/2018  . Hypovolemic shock ([place])   . Hyperkalemia   . Hyponatremia   . Failed total hip arthroplasty ([place]) 11/27/2017  . Status post revision of total hip 11/27/2017  . Elevated cholesterol 10/11/2015  . 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[place]) 10/04/2014  . Bilateral leg edema 08/01/2014  . Elevated BP 08/01/2014  . Rheumatoid arthritis involving multiple joints ([place]) 05/30/2014  . Osteoarthritis of right hip 04/28/2014  . Status post total replacement of right hip 04/28/2014  . Long-term use of high-risk medication 11/11/2013  . Routine general medical examination at a health care facility 11/11/2013  . Symptomatic anemia 11/11/2013  . Neuropathic pain 07/07/2013  . Constipation due to pain medication  07/07/2013  . Protein-calorie malnutrition, severe ([place]) 06/08/2013  . Lung cancer, Right upper lobe 05/08/2013  . Sciatica of right side 08/13/2011  . Osteopenia 03/07/2010  . PARESTHESIA 03/07/2010  . CT, CHEST, ABNORMAL 12/18/2008  . ABNORMAL ECHOCARDIOGRAM 12/14/2008  . [place] SYNDROME 11/29/2008  . HYPOGLYCEMIA 06/29/2006  . RAYNAUD'S DISEASE 06/29/2006    Past Medical History:  Diagnosis Date  . Arthralgia of multiple joints    followed by dr Gerilyn Nestle  . Arthritis   . Cardiomyopathy ([place])   . Chronic constipation   . Chronic inflammatory arthritis    rhemotolgist-  dr a. Gerilyn Nestle [facility])  . Dry eyes    eye drops used   . GERD (gastroesophageal reflux disease)   . H/O discoid lupus erythematosus   . Hiatal hernia   . History of colon polyps   . Hypothyroidism   . Iron deficiency anemia   . LBBB (left bundle branch block) 2010  . Mitchell's disease (erythromelalgia) [facility])    neurologist-  dr patel  . Nocturia   . Non-small cell cancer of right lung [facility]) surgeon-- dr gerhardt/  oncologist-  dr Julien Nordmann--- per  lov notes no recurrence/   11-18-2017 per pt denies any symptoms   dx 2015--  Stage IIA (T2b,N0,M0) , +EGFR  mutation in exon 21, non-small cell adenocarcinoma right upper lobe---  s/p  Right upper lobectomy , right middley wedge resection and node dissection---  no chemo or radiation therapy  . OA (osteoarthritis)    hands  . Osteoporosis   . PONV (postoperative nausea and vomiting)    likes phenergan  . Raynaud's phenomenon 1965  . Renal insufficiency   . Rheumatoid arthritis ([place])   . Sciatica   . Scoliosis   . Sjogren's syndrome [facility])     Past Surgical History:  Procedure Laterality Date  . ANTERIOR HIP REVISION Right 11/27/2017   Procedure: RIGHT HIP ACETABULAR REVISION;  Surgeon: [name], MD;  Location: [facility];  Service: Orthopedics;  Laterality: Right;  . ANTERIOR HIP REVISION Right 01/24/2018   Procedure: OPEN  REDUCTION OF DISLOCATED ANTERIOR HIP WITH REVISION OF LINER AND HIP BALL;  Surgeon: [name], MD;  Location: [facility];  Service: Orthopedics;  Laterality: Right;  . APPENDECTOMY  1950s  . BIOPSY  04/14/2018   Procedure: BIOPSY;  Surgeon: Yetta Flock, MD;  Location: [place];  Service: Gastroenterology;;  . BIOPSY  04/16/2018   Procedure: BIOPSY;  Surgeon: Irving Copas., MD;  [facility] END[facility]COPY;  Service: Gastroenterology;;  . CARDIOVASCULAR STRESS TEST  12/2008    mild fixed basal to mid septal perfusion defect felt likely due to artifact from LBBB, no ischemia, EF 58%  . COLONOSCOPY    . COLONOSCOPY WITH PROPOFOL N/A 04/16/2018   Procedure: COLONOSCOPY WITH PROPOFOL;  Surgeon: Rush Landmark Telford Nab., MD;  Location: [place];  Service: Gastroenterology;  Laterality: N/A;  . ESOPHAGOGASTRODUODENOSCOPY (EGD) WITH PROPOFOL N/A 04/14/2018   Procedure: ESOPHAGOGASTRODUODENOSCOPY (EGD) WITH PROPOFOL;  Surgeon: Yetta Flock, MD;  Location: [place];  Service: Gastroenterology;  Laterality: N/A;  . FEMORAL-POPLITEAL BYPASS GRAFT Right 04/10/2018   Procedure: REPAIR RIGHT FEMORAL ARTERY PSEUDOANEURYSM, RETROPERITONEAL EXPOSURE OF ILIAC ARTERY, RIGHT POPLITEAL EMBOLECTOMY;  Surgeon: Angelia Mould, MD;  Location: [place];  Service: Vascular;  Laterality: Right;  . LYMPH NODE DISSECTION Right 06/07/2013   Procedure: LYMPH NODE DISSECTION;  Surgeon: [name], MD;  Location: [place];  Service: Thoracic;  Laterality: Right;  . PATCH ANGIOPLASTY Right 04/10/2018   Procedure: PATCH  ANGIOPLASTY OF RIGHT FEMORAL ARTERY USING BOVINE PATCH, PATCH ANGIOPLASTY OF RIGHT POPLITEAL ARTERY USING BOVINE PATCH;  Surgeon: Angelia Mould, MD;  Location: [place];  Service: Vascular;  Laterality: Right;  . [place]   "large incision from chest to up to shoulder, the nerves were tied together, for raynaud's  . THORACOTOMY  06/07/2013    Procedure: MINI/LIMITED THORACOTOMY; right middle lobe wedge resection;  Surgeon: [name], MD;  Location: [place];  Service: Thoracic;;  . TONSILLECTOMY  child  . TOTAL ABDOMINAL HYSTERECTOMY  1980's    W/ BSO  . TOTAL HIP ARTHROPLASTY Right 04/28/2014   Procedure: RIGHT TOTAL HIP ARTHROPLASTY ANTERIOR APPROACH;  Surgeon: [name], MD;  [facility];  Service: Orthopedics;  Laterality: Right;  . TRANSTHORACIC ECHOCARDIOGRAM  12/11/2008   ef 75-10%, grade 1 diastolic dysfunction/  mild LAE/  mild AR and MR/  trivial TR  . VIDEO ASSISTED THORACOSCOPY (VATS)/WEDGE RESECTION Right 06/07/2013   Procedure: VIDEO ASSISTED THORACOSCOPY (VATS)/right upper lobectomy, On Q;  Surgeon: [name], MD;  Location: [place];  Service:  Thoracic;  Laterality: Right;  Mar[place] VIDEO BRONCHOSCOPY N/A 06/07/2013   Procedure: VIDEO BRONCHOSCOPY;  Surgeon: [name], MD;  Location[facility] OR;  Service: Thoracic;  Laterality: N/A;  . VIDEO BRONCHOSCOPY WITH ENDOBRONCHIAL NAVIGATION N/A 05/04/2013   Procedure: VIDEO BRONCHOSCOPY WITH ENDOBRONCHIAL NAVIGATION;  Surgeon: [name], MD;  Location: [place];  Service: Thoracic;  Laterality: N/A;    Social History   Tobacco Use  . Smoking status: Never Smoker  . Smokeless tobacco: Never Used  Substance Use Topics  . Alcohol use: Not Currently    Frequency: Never  . Drug use: Never    Family History  Problem Relation Age of Onset  . Coronary artery disease Father   . Colon cancer Father   . Diabetes Father   . Cancer Father        colon  . Other Mother 19       MVA  . Healthy Sister   . Other Brother        Killed on war  . Healthy Daughter   . Esophageal cancer Neg Hx   . Kidney disease Neg Hx   . Liver disease Neg Hx     Allergies  Allergen Reactions  . Amlodipine Rash  . Prochlorperazine Edisylate Anaphylaxis    Compazine--- tongue swells and rash  . Aspirin Other (See Comments)    nose bleeds  . Cymbalta  [Duloxetine Hcl] Diarrhea, Nausea And Vomiting and Other (See Comments)    Increased blood pressure  . Pamelor [Nortriptyline Hcl] Diarrhea and Nausea Only    Increased Heart rate and BP    Medication list has been reviewed and updated.  Current Outpatient Medications on File Prior to Visit  Medication Sig Dispense Refill  . Biotin 1000 MCG tablet Take 1,000 mcg by mouth daily.     . Calcium Carbonate-Vitamin D (CALCIUM 500 + D) 500-125 MG-UNIT TABS Take 1 tablet by mouth daily.     Marland Kitchen docusate sodium (STOOL SOFTENER) 100 MG capsule Take 100 mg by mouth 2 (two) times daily.      . furosemide (LASIX) 20 MG tablet Take 1 tablet (20 mg total) by mouth daily.    Marland Kitchen gabapentin (NEURONTIN) 300 MG capsule TAKE '300MG'$  IN THE MORNING, '300MG'$  IN THE AFTERNOON, AND '600MG'$  AT BEDTIME. (Patient taking differently: Take 300-600 mg by mouth See admin instructions. Take '300mg'$  in the morning, '300mg'$  in the afternoon, and '600mg'$  at bedtime.) 120 capsule 5  . Glucosamine-Chondroit-Vit C-Mn (GLUCOSAMINE CHONDR 1500 COMPLX PO) Take 1 capsule by mouth daily.     Marland Kitchen HYDROcodone-acetaminophen (NORCO/VICODIN) 5-325 MG tablet Take 1 tablet by mouth every 4 (four) hours as needed for severe pain. 40 tablet 0  . levothyroxine (SYNTHROID, LEVOTHROID) 88 MCG tablet TAKE 1 TABLET (88 MCG TOTAL) BY MOUTH DAILY BEFORE BREAKFAST. (Patient taking differently: Take 88 mcg by mouth at bedtime. Per pt takes at 10pm) 90 tablet 1  . methocarbamol (ROBAXIN) 500 MG tablet TAKE 1 TABLET (500 MG TOTAL) BY MOUTH EVERY 6 (SIX) HOURS AS NEEDED FOR MUSCLE SPASMS. 40 tablet 0  . metoprolol succinate (TOPROL XL) 25 MG 24 hr tablet Take 0.5 tablets (12.5 mg total) by mouth daily. 45 tablet 3  . Multiple Vitamin (MULTIVITAMIN) tablet Take 1 tablet by mouth daily.      Marland Kitchen omeprazole (PRILOSEC) 20 MG capsule TAKE 1 CAPSULE BY MOUTH 2 TIMES DAILY BEFORE A MEAL. (Patient taking differently: Take 20 mg by mouth 2 (two) times daily before a meal.  TAKE 1 CAPSULE  BY MOUTH 2 TIMES DAILY BEFORE A MEAL.) 180 capsule 1  . senna-docusate (SENOKOT-S) 8.6-50 MG tablet Take 1 tablet by mouth at bedtime as needed for mild constipation. 30 tablet 0  . spironolactone (ALDACTONE) 25 MG tablet Take 12.5 mg by mouth every morning.   2   No current facility-administered medications on file prior to visit.     Review of Systems:  As per HPI- otherwise negative.   Physical Examination: Vitals:   04/21/18 1445  BP: 118/66  Pulse: 82  Resp: 18  Temp: 97.7 F (36.5 C)  SpO2: 95%   Vitals:   04/21/18 1445  Height: '5\' 2"'$  (1.575 m)   Body mass index is 20.49 kg/m. Ideal Body Weight: Weight in (lb) to have BMI = 25: 136.4  GEN: WDWN, NAD, Non-toxic, A & O x 3, tiny build, looks well HEENT: Atraumatic, Normocephalic. Neck supple. No masses, No LAD. Ears and Nose: No external deformity. CV: RRR, No M/G/R. No JVD. No thrill. No extra heart sounds. PULM: CTA B, no wheezes, crackles, rhonchi. No retractions. No resp. distress. No accessory muscle use. ABD: S, NT, ND, +BS. No rebound. No HSM. EXTR: No c/c The right leg displays edema from the thigh down.  There is some redness and erythema of the right anterior shin, the patient reports this is improving.  Swelling appears somewhat better from when I saw her prior to hospitalization.  Surgical incisions at the groin, and medial calf appear to be healing well, do not appear to be infected The foot is warm and well-perfused, with a normal dorsalis pedis pulse, normal sensation NEURO right in wheelchair today as it is a long walk to my clinic rooms.  However patient and her daughter reports she is walking at home with her walker.  She is able to stand so I can examine her in clinic PSYCH: Normally interactive. Conversant. Not depressed or anxious appearing.  Calm demeanor.   Assessment and Plan: Popliteal artery occlusion, right (HCC)  V tach ([place]) - Plan: Magnesium  Acute blood loss anemia - Plan: CBC  Lower  extremity edema  Medication management - Plan: Basic metabolic panel  Cellulitis of right leg  Following up from recent hospitalization today.  Latonja is doing well, feels that she is making progress.  Her daughter [name] is here with her today We will repeat CBC today, to monitor her hemoglobin We will also check magnesium due to episode of V. tach, and potassium Her creatinine bumped slightly during her admission, will recheck today She continues to take doxycycline due to H. pylori infection.  This may be a good idea anyway, due to recent cellulitis of her leg. I will follow-up with Legrand Rams pending her labs.  She plans to schedule follow-up visits with vascular surgery, cardiology, and gastroenterology  Given that she has so many other appointments pending, I have asked her to see me in 4 to 6 weeks if needed. She will keep me posted as any concerns or changes As usual Shaquia is a very sharp and knowledgeable woman, she has a good understanding of her health and is an excellent self advocate  Signed Lamar Blinks, MD  Received her labs, message to pt   Results for orders placed or performed in visit on 61/44/31  Basic metabolic panel  Result Value Ref Range   Sodium 134 (L) 135 - 145 mEq/L   Potassium 4.7 3.5 - 5.1 mEq/L   Chloride 95 (L) 96 - 112  mEq/L   CO2 33 (H) 19 - 32 mEq/L   Glucose, Bld 113 (H) 70 - 99 mg/dL   BUN 25 (H) 6 - 23 mg/dL   Creatinine, Ser 1.10 0.40 - 1.20 mg/dL   Calcium 9.6 8.4 - 10.5 mg/dL   GFR 47.16 (L) >60.00 mL/min  CBC  Result Value Ref Range   WBC 7.3 4.0 - 10.5 K/uL   RBC 3.62 (L) 3.87 - 5.11 Mil/uL   Platelets 414.0 (H) 150.0 - 400.0 K/uL   Hemoglobin 11.0 (L) 12.0 - 15.0 g/dL   HCT 32.9 (L) 36.0 - 46.0 %   MCV 90.9 78.0 - 100.0 fl   MCHC 33.5 30.0 - 36.0 g/dL   RDW 14.2 11.5 - 15.5 %  Magnesium  Result Value Ref Range   Magnesium 2.0 1.5 - 2.5 mg/dL   Globin is improved, up from 9.2 Metabolic profile looks okay-BUN is increased, GFR down  slightly from most recent labs, but creatinine is improved from when it bumped during hospitalization  Message to patient

## 2018-04-21 NOTE — Telephone Encounter (Signed)
Labs entered into Epic; appt for lab work made for patient;

## 2018-04-21 NOTE — Patient Instructions (Signed)
It was great to see you today  I will be in touch with your labs  Keep me posted about any issues or concerns  Please see me in 4-6 weeks for a recheck as needed

## 2018-04-21 NOTE — Telephone Encounter (Signed)
MyChart message sent to patient concerning lab work being ordered and appt set for labs;

## 2018-04-21 NOTE — Addendum Note (Signed)
Addended by: Mohammed Kindle on: 04/21/2018 01:46 PM   Modules accepted: Orders

## 2018-04-22 ENCOUNTER — Telehealth: Payer: Self-pay | Admitting: *Deleted

## 2018-04-22 ENCOUNTER — Encounter: Payer: Self-pay | Admitting: Family Medicine

## 2018-04-22 ENCOUNTER — Other Ambulatory Visit: Payer: Self-pay

## 2018-04-22 LAB — BASIC METABOLIC PANEL
BUN: 25 mg/dL — ABNORMAL HIGH (ref 6–23)
CALCIUM: 9.6 mg/dL (ref 8.4–10.5)
CO2: 33 mEq/L — ABNORMAL HIGH (ref 19–32)
Chloride: 95 mEq/L — ABNORMAL LOW (ref 96–112)
Creatinine, Ser: 1.1 mg/dL (ref 0.40–1.20)
GFR: 47.16 mL/min — ABNORMAL LOW (ref >60.00)
Glucose, Bld: 113 mg/dL — ABNORMAL HIGH (ref 70–99)
Potassium: 4.7 mEq/L (ref 3.5–5.1)
Sodium: 134 mEq/L — ABNORMAL LOW (ref 135–145)

## 2018-04-22 LAB — CBC
HCT: 32.9 % — ABNORMAL LOW (ref 36.0–46.0)
Hemoglobin: 11 g/dL — ABNORMAL LOW (ref 12.0–15.0)
MCHC: 33.5 g/dL (ref 30.0–36.0)
MCV: 90.9 fl (ref 78.0–100.0)
PLATELETS: 414 10*3/uL — AB (ref 150.0–400.0)
RBC: 3.62 Mil/uL — ABNORMAL LOW (ref 3.87–5.11)
RDW: 14.2 % (ref 11.5–15.5)
WBC: 7.3 10*3/uL (ref 4.0–10.5)

## 2018-04-22 LAB — MAGNESIUM: Magnesium: 2 mg/dL (ref 1.5–2.5)

## 2018-04-22 MED ORDER — ONDANSETRON 4 MG PO TBDP: 4.0000 mg | ORAL_TABLET | Freq: Four times a day (QID) | ORAL | 0 refills | Status: DC | PRN

## 2018-04-22 NOTE — Telephone Encounter (Signed)
Please review patient message

## 2018-04-22 NOTE — Telephone Encounter (Signed)
Received Lab Report results from [facility] Pathology Labs via [name]; forwarded to provider/SLS 03/05

## 2018-04-23 ENCOUNTER — Telehealth: Payer: Self-pay | Admitting: Family Medicine

## 2018-04-23 NOTE — Telephone Encounter (Signed)
This message was answered in another pt message from 04/22/18.

## 2018-04-23 NOTE — Telephone Encounter (Signed)
Verbal orders given  

## 2018-04-23 NOTE — Telephone Encounter (Signed)
Copied from [place] [id number]. Topic: Quick Communication - Home Health Verbal Orders >> Apr 23, 2018  1:51 PM Gustavus Messing wrote: Caller/Agency: [place] Number: [phone] [place] Requesting OT/PT/Skilled Nursing/Social Work: Pt completed evalution and is request home health orders for funtional mobility, strengthening, and balance Frequency: 1 week 1 2 week 2 2 week 3 2 week 4

## 2018-04-28 ENCOUNTER — Telehealth: Payer: Self-pay | Admitting: *Deleted

## 2018-04-28 NOTE — Telephone Encounter (Signed)
Received Home Health Certification and Plan of Care; forwarded to provider/SLS 03/11

## 2018-04-28 NOTE — Telephone Encounter (Signed)
Received Physician Orders from [facility] [place] [facility]; forwarded to provider/SLS 03/11

## 2018-04-29 ENCOUNTER — Telehealth: Payer: Self-pay | Admitting: Gastroenterology

## 2018-04-29 NOTE — Telephone Encounter (Signed)
Called and spoke with patient-patient needed clarification on dosing of Flagyl-medication regimen clarified for patient-patient verbalized understanding of information/instructions; patient advised to call back to the office if questions/concerns arise;

## 2018-04-29 NOTE — Telephone Encounter (Signed)
Pls call pt, she has some questions regarding medications that she was put on.

## 2018-04-30 ENCOUNTER — Telehealth: Payer: Self-pay | Admitting: *Deleted

## 2018-04-30 ENCOUNTER — Encounter: Payer: Self-pay | Admitting: Family Medicine

## 2018-04-30 NOTE — Telephone Encounter (Signed)
Received Physician Orders from  [facility]; forwarded to provider/SLS 03/13

## 2018-05-01 ENCOUNTER — Other Ambulatory Visit: Payer: Self-pay | Admitting: Family Medicine

## 2018-05-03 ENCOUNTER — Other Ambulatory Visit (INDEPENDENT_AMBULATORY_CARE_PROVIDER_SITE_OTHER): Payer: Self-pay | Admitting: Orthopaedic Surgery

## 2018-05-04 NOTE — Telephone Encounter (Signed)
Please advise 

## 2018-05-10 ENCOUNTER — Encounter: Payer: Self-pay | Admitting: Family Medicine

## 2018-05-13 ENCOUNTER — Encounter: Payer: Self-pay | Admitting: Family Medicine

## 2018-05-18 ENCOUNTER — Other Ambulatory Visit: Payer: Medicare Other

## 2018-05-21 ENCOUNTER — Telehealth: Payer: Self-pay | Admitting: Family Medicine

## 2018-05-21 NOTE — Telephone Encounter (Signed)
Spoke w/ Debbie- verbal orders given.  

## 2018-05-21 NOTE — Telephone Encounter (Signed)
Copied from [place] [id number]. Topic: Quick Communication - Home Health Verbal Orders >> May 21, 2018 10:26 AM Lennox Solders wrote: Caller/Agency: debbie  PT with [facility]  Callback Number: [phone] Requesting PT extended orders 2 x 2, 1x2 ok to leave ok on cell

## 2018-05-24 ENCOUNTER — Other Ambulatory Visit (INDEPENDENT_AMBULATORY_CARE_PROVIDER_SITE_OTHER): Payer: Self-pay | Admitting: Orthopaedic Surgery

## 2018-05-24 ENCOUNTER — Telehealth: Payer: Self-pay | Admitting: *Deleted

## 2018-05-24 NOTE — Telephone Encounter (Signed)
Please advise 

## 2018-05-24 NOTE — Telephone Encounter (Signed)
Received Physician Orders from [facility]; forwarded to provider/SLS 04/06

## 2018-05-25 ENCOUNTER — Ambulatory Visit: Payer: Medicare Other | Admitting: Gastroenterology

## 2018-06-04 ENCOUNTER — Telehealth: Payer: Self-pay

## 2018-06-04 ENCOUNTER — Encounter: Payer: Self-pay | Admitting: Family Medicine

## 2018-06-04 DIAGNOSIS — D62 Acute posthemorrhagic anemia: Secondary | ICD-10-CM

## 2018-06-04 DIAGNOSIS — Z79899 Other long term (current) drug therapy: Secondary | ICD-10-CM

## 2018-06-04 NOTE — Telephone Encounter (Signed)
Copied from [place] [id number]. Topic: General - Other >> Jun 03, 2018  2:18 PM Carolyn Stare wrote:  Pt call to say she cancel her appt 3.31.2020 and is asking if she can have the home health nurse do that lab >> Jun 03, 2018  2:23 PM Carolyn Stare wrote:    Pt would also like to discuss her regular lab that she has drawn every so often

## 2018-06-04 NOTE — Addendum Note (Signed)
Addended by: Lamar Blinks C on: 06/04/2018 01:53 PM   Modules accepted: Orders

## 2018-06-04 NOTE — Addendum Note (Signed)
Addended by: Lamar Blinks C on: 06/04/2018 03:17 PM   Modules accepted: Orders

## 2018-06-04 NOTE — Telephone Encounter (Signed)
Spoke with pt and scheduled lab appt for 06/08/18 at 11am. Pt also had future lab orders for IBC + ferritin from Dr Bryan Lemma. Pt wants to have both labs done the same day but due to problems with billing, we cannot do labs for 2 separate doctors on the same visit. Do you want to order the IBC + ferritin under your orders or have pt return another day for additional lab?

## 2018-06-04 NOTE — Telephone Encounter (Signed)
Notified pt that IBC + ferritin has now been ordered by Dr Lorelei Pont so she will not need to return for separate lab visit for Dr Bryan Lemma.

## 2018-06-04 NOTE — Telephone Encounter (Signed)
Ok to have home health draw lab or no?

## 2018-06-04 NOTE — Telephone Encounter (Signed)
Called her back- she would like to have her blood drawn at our office next Tuesday at 11 am.  Will put her on lab schedule   CBC and BMP ordered for her

## 2018-06-08 ENCOUNTER — Other Ambulatory Visit (INDEPENDENT_AMBULATORY_CARE_PROVIDER_SITE_OTHER): Payer: Medicare Other

## 2018-06-08 ENCOUNTER — Other Ambulatory Visit: Payer: Self-pay

## 2018-06-08 DIAGNOSIS — D62 Acute posthemorrhagic anemia: Secondary | ICD-10-CM | POA: Diagnosis not present

## 2018-06-08 DIAGNOSIS — Z79899 Other long term (current) drug therapy: Secondary | ICD-10-CM

## 2018-06-08 LAB — CBC
HCT: 36 % (ref 36.0–46.0)
Hemoglobin: 11.9 g/dL — ABNORMAL LOW (ref 12.0–15.0)
MCHC: 33.1 g/dL (ref 30.0–36.0)
MCV: 90.2 fl (ref 78.0–100.0)
Platelets: 186 10*3/uL (ref 150.0–400.0)
RBC: 3.99 Mil/uL (ref 3.87–5.11)
RDW: 15.5 % (ref 11.5–15.5)
WBC: 4.6 10*3/uL (ref 4.0–10.5)

## 2018-06-08 LAB — BASIC METABOLIC PANEL
BUN: 31 mg/dL — ABNORMAL HIGH (ref 6–23)
CO2: 29 mEq/L (ref 19–32)
Calcium: 9.5 mg/dL (ref 8.4–10.5)
Chloride: 97 mEq/L (ref 96–112)
Creatinine, Ser: 1.09 mg/dL (ref 0.40–1.20)
GFR: 47.64 mL/min — ABNORMAL LOW (ref >60.00)
Glucose, Bld: 96 mg/dL (ref 70–99)
Potassium: 4.8 mEq/L (ref 3.5–5.1)
Sodium: 132 mEq/L — ABNORMAL LOW (ref 135–145)

## 2018-06-08 LAB — IBC + FERRITIN
Ferritin: 850.5 ng/mL — ABNORMAL HIGH (ref 10.0–291.0)
Iron: 91 ug/dL (ref 42–145)
Saturation Ratios: 33.3 % (ref 20.0–50.0)
Transferrin: 195 mg/dL — ABNORMAL LOW (ref 212.0–360.0)

## 2018-06-09 ENCOUNTER — Encounter: Payer: Self-pay | Admitting: Family Medicine

## 2018-06-17 ENCOUNTER — Encounter: Payer: Self-pay | Admitting: Orthopaedic Surgery

## 2018-06-21 ENCOUNTER — Ambulatory Visit: Payer: Self-pay | Admitting: Orthopaedic Surgery

## 2018-06-21 ENCOUNTER — Telehealth: Payer: Self-pay | Admitting: *Deleted

## 2018-06-21 NOTE — Telephone Encounter (Signed)
Spoke with pt, she will call back to schedule appointment.

## 2018-06-21 NOTE — Telephone Encounter (Signed)
Pt called to cancel appt and does not want to be seen until covid 19 is past. She states she feels fine a/und that this visit was just a routine f/u

## 2018-06-25 ENCOUNTER — Telehealth: Payer: Self-pay | Admitting: *Deleted

## 2018-06-25 NOTE — Telephone Encounter (Signed)
Received Episode Summary Report from [facility]; forwarded to provider/SLS 05/08

## 2018-06-30 ENCOUNTER — Ambulatory Visit: Payer: Medicare Other | Admitting: Cardiology

## 2018-07-01 ENCOUNTER — Other Ambulatory Visit (INDEPENDENT_AMBULATORY_CARE_PROVIDER_SITE_OTHER): Payer: Self-pay | Admitting: Orthopaedic Surgery

## 2018-07-01 NOTE — Telephone Encounter (Signed)
Please advise 

## 2018-08-05 ENCOUNTER — Encounter: Payer: Self-pay | Admitting: Family Medicine

## 2018-08-18 ENCOUNTER — Other Ambulatory Visit (INDEPENDENT_AMBULATORY_CARE_PROVIDER_SITE_OTHER): Payer: Self-pay | Admitting: Orthopaedic Surgery

## 2018-08-18 NOTE — Telephone Encounter (Signed)
Please advise 

## 2018-08-28 ENCOUNTER — Encounter: Payer: Self-pay | Admitting: Family Medicine

## 2018-09-14 ENCOUNTER — Encounter: Payer: Self-pay | Admitting: Family Medicine

## 2018-09-14 DIAGNOSIS — M069 Rheumatoid arthritis, unspecified: Secondary | ICD-10-CM

## 2018-09-14 DIAGNOSIS — M3500 Sicca syndrome, unspecified: Secondary | ICD-10-CM

## 2018-10-07 ENCOUNTER — Other Ambulatory Visit (INDEPENDENT_AMBULATORY_CARE_PROVIDER_SITE_OTHER): Payer: Self-pay | Admitting: Orthopaedic Surgery

## 2018-10-07 NOTE — Telephone Encounter (Signed)
Please advise 

## 2018-10-07 NOTE — Progress Notes (Signed)
Office Visit Note  Patient: [name]             Date of Birth: [birth year]           MRN: [id number]             PCP: Darreld Mclean, MD Referring: Darreld Mclean, MD Visit Date: 10/21/2018 Occupation: '@GUAROCC'$ @  Subjective:  Other (previous Dr. Earnest Conroy patient, has been on PLQ in the past, now on MTX. )   History of Present Illness: MARLAINE AREY is a 85 y.o. female with history of rheumatoid arthritis.  She states her symptoms got 19 years ago with swelling in her hands and feet.  She was diagnosed with rheumatoid arthritis in [place].  She was a started on Mobic and Plaquenil which she continued for 14 years.  She moved to [place] about 14 years ago and had been under care of Dr. Earnest Conroy.  She was initially on Plaquenil and then Plaquenil was discontinued 4 years ago due to some retinal changes.  She had repeat eye examination by Dr. Zadie Rhine in July 2020 but there was no progression of the retinal disease.  She states she was on no treatment for the last 4 years but due to increased pain and discomfort in her joints she was placed on methotrexate 3 tablets p.o. weekly.  But due to her her low weight Dr. Earnest Conroy reduced her dose of methotrexate to 2 tablets/week.  Patient has not noticed any improvement on methotrexate so far.  She has been having increased pain and discomfort in her both hands, both shoulders and her feet.  She has difficulty lifting her arms.  She also gives history of lower back pain for several years.  She has been going to the spine center where she has had injections in her lower back.  She states she was getting bone densities in the past by Dr. Earnest Conroy and was diagnosed with osteopenia.  She has not taken any treatment for osteoporosis.  Activities of Daily Living:  Patient reports morning stiffness for 24 hours.   Patient Reports nocturnal pain.  Difficulty dressing/grooming: Reports Difficulty climbing stairs: Denies Difficulty getting out of chair: Reports Difficulty  using hands for taps, buttons, cutlery, and/or writing: Reports  Review of Systems  Constitutional: Negative for fatigue.  HENT: Negative for mouth sores, mouth dryness and nose dryness.   Eyes: Negative for itching and dryness.  Respiratory: Negative for shortness of breath, wheezing and difficulty breathing.   Cardiovascular: Negative for chest pain and palpitations.  Gastrointestinal: Negative for blood in stool, constipation and diarrhea.  Endocrine: Negative for increased urination.  Genitourinary: Negative for difficulty urinating and painful urination.  Musculoskeletal: Positive for arthralgias, joint pain, joint swelling and morning stiffness.  Skin: Negative for rash and redness.  Allergic/Immunologic: Negative for susceptible to infections.  Neurological: Positive for weakness. Negative for dizziness, light-headedness, numbness and headaches.  Hematological: Negative for bruising/bleeding tendency.  Psychiatric/Behavioral: Negative for sleep disturbance. The patient is not nervous/anxious.     PMFS History:  Patient Active Problem List   Diagnosis Date Noted  . Popliteal artery occlusion, right ([place]) 04/10/2018  . HTN (hypertension) 04/10/2018  . Femoral artery pseudo-aneurysm, right ([place]) 04/09/2018  . Anemia of chronic disease 02/24/2018  . Unstable right hip arthroplasty 01/24/2018  . History of revision of total replacement of right hip joint 01/24/2018  . Hypovolemic shock ([place])   . Hyperkalemia   . Hyponatremia   . Failed total hip arthroplasty ([place])  11/27/2017  . Status post revision of total hip 11/27/2017  . Elevated cholesterol 10/11/2015  . 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[place]) 10/04/2014  . Bilateral leg edema 08/01/2014  . Elevated BP 08/01/2014  . Rheumatoid arthritis involving multiple joints ([place]) 05/30/2014  . Osteoarthritis of right hip 04/28/2014  . Status post total replacement of right hip 04/28/2014  . Long-term use of high-risk medication  11/11/2013  . Routine general medical examination at a health care facility 11/11/2013  . Symptomatic anemia 11/11/2013  . Neuropathic pain 07/07/2013  . Constipation due to pain medication 07/07/2013  . Protein-calorie malnutrition, severe ([place]) 06/08/2013  . Lung cancer, Right upper lobe 05/08/2013  . Sciatica of right side 08/13/2011  . Osteopenia 03/07/2010  . PARESTHESIA 03/07/2010  . CT, CHEST, ABNORMAL 12/18/2008  . ABNORMAL ECHOCARDIOGRAM 12/14/2008  . [place] SYNDROME 11/29/2008  . HYPOGLYCEMIA 06/29/2006  . RAYNAUD'S DISEASE 06/29/2006    Past Medical History:  Diagnosis Date  . Arthralgia of multiple joints    followed by dr Gerilyn Nestle  . Arthritis   . Cardiomyopathy ([place])   . Chronic constipation   . Chronic inflammatory arthritis    rhemotolgist-  dr a. Gerilyn [facility])  . Dry eyes    eye drops used   . GERD (gastroesophageal reflux disease)   . H/O discoid lupus erythematosus   . Hiatal hernia   . History of colon polyps   . Hypothyroidism   . Iron deficiency anemia   . LBBB (left bundle branch block) 2010  . Mitchell's disease (erythromelalgia) [facility])    neurologist-  dr patel  . Nocturia   . Non-small cell cancer of right lung [facility]) surgeon-- dr gerhardt/  oncologist-  dr Julien Nordmann--- per lov notes no recurrence/   11-18-2017 per pt denies any symptoms   dx 2015--  Stage IIA (T2b,N0,M0) , +EGFR  mutation in exon 21, non-small cell adenocarcinoma right upper lobe---  s/p  Right upper lobectomy , right middley wedge resection and node dissection---  no chemo or radiation therapy  . OA (osteoarthritis)    hands  . Osteoporosis   . PONV (postoperative nausea and vomiting)    likes phenergan  . Raynaud's phenomenon 1965  . Renal insufficiency   . Rheumatoid arthritis ([place])   . Sciatica   . Scoliosis   . Sjogren's syndrome ([place])     Family History  Problem Relation Age of Onset  . Coronary artery disease Father   . Colon cancer Father   .  Diabetes Father   . Cancer Father        colon  . Other Mother 43       MVA  . Healthy Sister   . Healthy Brother   . Healthy Daughter   . Hypothyroidism Daughter   . Other Brother        killed in war  . Pneumonia Sister   . Healthy Daughter   . Esophageal cancer Neg Hx   . Kidney disease Neg Hx   . Liver disease Neg Hx    Past Surgical History:  Procedure Laterality Date  . ANTERIOR HIP REVISION Right 11/27/2017   Procedure: RIGHT HIP ACETABULAR REVISION;  Surgeon: [name], MD;  Location: [facility];  Service: Orthopedics;  Laterality: Right;  . ANTERIOR HIP REVISION Right 01/24/2018   Procedure: OPEN REDUCTION OF DISLOCATED ANTERIOR HIP WITH REVISION OF LINER AND HIP BALL;  Surgeon: [name], MD;  [facility]: [facility];  Service: Orthopedics;  Laterality: Right;  .  APPENDECTOMY  1950s  . BIOPSY  04/14/2018   Procedure: BIOPSY;  Surgeon: Yetta Flock, MD;  Location: [place];  Service: Gastroenterology;;  . BIOPSY  04/16/2018   Procedure: BIOPSY;  Surgeon: Irving Copas., MD;  Location: [facility];  Service: Gastroenterology;;  . CARDIOVASCULAR STRESS TEST  12/2008    mild fixed basal to mid septal perfusion defect felt likely due to artifact from LBBB, no ischemia, EF 58%  . COLONOSCOPY    . COLONOSCOPY WITH PROPOFOL N/A 04/16/2018   Procedure: COLONOSCOPY WITH PROPOFOL;  Surgeon: Rush Landmark Telford Nab., MD;  Location: [place];  Service: Gastroenterology;  Laterality: N/A;  . ESOPHAGOGASTRODUODENOSCOPY (EGD) WITH PROPOFOL N/A 04/14/2018   Procedure: ESOPHAGOGASTRODUODENOSCOPY (EGD) WITH PROPOFOL;  Surgeon: Yetta Flock, MD;  Location: [place];  Service: Gastroenterology;  Laterality: N/A;  . FEMORAL-POPLITEAL BYPASS GRAFT Right 04/10/2018   Procedure: REPAIR RIGHT FEMORAL ARTERY PSEUDOANEURYSM, RETROPERITONEAL EXPOSURE OF ILIAC ARTERY, RIGHT POPLITEAL EMBOLECTOMY;  Surgeon: Angelia Mould, MD;  Location: [place];   Service: Vascular;  Laterality: Right;  . LYMPH NODE DISSECTION Right 06/07/2013   Procedure: LYMPH NODE DISSECTION;  Surgeon: [name], MD;  Location: [place];  Service: Thoracic;  Laterality: Right;  . PATCH ANGIOPLASTY Right 04/10/2018   Procedure: PATCH  ANGIOPLASTY OF RIGHT FEMORAL ARTERY USING BOVINE PATCH, PATCH ANGIOPLASTY OF RIGHT POPLITEAL ARTERY USING BOVINE PATCH;  Surgeon: Angelia Mould, MD;  Location: [place];  Service: Vascular;  Laterality: Right;  . [place]   "large incision from chest to up to shoulder, the nerves were tied together, for raynaud's  . THORACOTOMY  06/07/2013   Procedure: MINI/LIMITED THORACOTOMY; right middle lobe wedge resection;  Surgeon: [name], MD;  Location: [place];  Service: Thoracic;;  . TONSILLECTOMY  child  . TOTAL ABDOMINAL HYSTERECTOMY  1980's    W/ BSO  . TOTAL HIP ARTHROPLASTY Right 04/28/2014   Procedure: RIGHT TOTAL HIP ARTHROPLASTY ANTERIOR APPROACH;  Surgeon: [name], MD;  [facility];  Service: Orthopedics;  Laterality: Right;  . TRANSTHORACIC ECHOCARDIOGRAM  12/11/2008   ef 63-84%, grade 1 diastolic dysfunction/  mild LAE/  mild AR and MR/  trivial TR  . VIDEO ASSISTED THORACOSCOPY (VATS)/WEDGE RESECTION Right 06/07/2013   Procedure: VIDEO ASSISTED THORACOSCOPY (VATS)/right upper lobectomy, On Q;  Surgeon: [name], MD;  Location: [place];  Service: Thoracic;  Laterality: Right;  [place] VIDEO BRONCHOSCOPY N/A 06/07/2013   Procedure: VIDEO BRONCHOSCOPY;  Surgeon: [name], MD;  [facility];  Service: Thoracic;  Laterality: N/A;  . VIDEO BRONCHOSCOPY WITH ENDOBRONCHIAL NAVIGATION N/A 05/04/2013   Procedure: VIDEO BRONCHOSCOPY WITH ENDOBRONCHIAL NAVIGATION;  Surgeon: [name], MD;  Location: [place];  Service: Thoracic;  Laterality: N/A;   Social History   Social History Narrative   Lives with husband, daughter and grandchild local.   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 Influenza Split 11/21/2011  . Influenza Whole 11/29/2007, 11/29/2008, 11/29/2009  . Influenza, High Dose Seasonal PF 10/30/2012, 01/02/2015  . Influenza,inj,Quad PF,6+ Mos 11/22/2013, 11/14/2015  . Influenza-Unspecified 11/13/2016, 11/12/2017  . Pneumococcal Conjugate-13 05/22/2015  . Pneumococcal Polysaccharide-23 06/13/2013  . Tdap 08/17/2017  . Zoster 01/27/2014     Objective: Vital Signs: BP (!) 152/86 (BP Location: Right Arm, Patient Position: Sitting, Cuff Size: Normal)   Pulse 72   Resp 14   Ht 4' 11.8" (1.519  m)   Wt 105 lb (47.6 kg)   BMI 20.64 kg/m    Physical Exam Vitals signs and nursing note reviewed.  Constitutional:      Appearance: She is well-developed.  HENT:     Head: Normocephalic and atraumatic.  Eyes:     Conjunctiva/sclera: Conjunctivae normal.  Neck:     Musculoskeletal: Normal range of motion.  Cardiovascular:     Rate and Rhythm: Normal rate and regular rhythm.     Heart sounds: Normal heart sounds.  Pulmonary:     Effort: Pulmonary effort is normal.     Breath sounds: Normal breath sounds.  Abdominal:     General: Bowel sounds are normal.     Palpations: Abdomen is soft.  Lymphadenopathy:     Cervical: No cervical adenopathy.  Skin:    General: Skin is warm and dry.     Capillary Refill: Capillary refill takes less than 2 seconds.  Neurological:     Mental Status: She is alert and oriented to person, place, and time.  Psychiatric:        Behavior: Behavior normal.      Musculoskeletal Exam: She has has limited range of motion of her cervical spine.  She has severe thoracolumbar scoliosis.  She had pain in her left side where the rib cage almost meets her pelvis.  She has some discomfort range of motion of her right hip joint.  Knee joints with good range of motion.  Ankle joints are in good range of motion.  She is some  osteoarthritic changes in her feet with hammertoes.  Dorsal spurring was noted.  No synovitis was noted.  Mild tenderness was noted over few of the MTPs as described above.  She has shoulder joint abduction about 90 degrees on the right and about 70 degrees on the left.  She has contracture in her right elbow.  She has thickening of bilateral wrist joints with mild synovitis in her left wrist joint.  She has synovitis over bilateral second and third MCP joints.  Thickening of all MCP joints PIP and DIP joints was noted.  CMC joint thickening and subluxation was noted.  CDAI Exam: CDAI Score: 13.2  Patient Global: 6 mm; Provider Global: 6 mm Swollen: 5 ; Tender: 12  Joint Exam      Right  Left  Glenohumeral   Tender   Tender  Wrist     Swollen Tender  MCP 2  Swollen Tender  Swollen Tender  MCP 3  Swollen Tender  Swollen Tender  Hip   Tender     MTP 1      Tender  MTP 2   Tender     MTP 3   Tender     MTP 4      Tender     Investigation: No additional findings.  Imaging: No results found.  Recent Labs: Lab Results  Component Value Date   WBC 4.6 06/08/2018   HGB 11.9 (L) 06/08/2018   PLT 186.0 06/08/2018   NA 132 (L) 06/08/2018   K 4.8 06/08/2018   CL 97 06/08/2018   CO2 29 06/08/2018   GLUCOSE 96 06/08/2018   BUN 31 (H) 06/08/2018   CREATININE 1.09 06/08/2018   BILITOT 0.8 04/17/2018   ALKPHOS 57 04/17/2018   AST 21 04/17/2018   ALT 10 04/17/2018   PROT 5.2 (L) 04/17/2018   ALBUMIN 2.4 (L) 04/17/2018   CALCIUM 9.5 06/08/2018   GFRAA 59 (L) 04/17/2018  Speciality Comments: No specialty comments available.  Procedures:  No procedures performed Allergies: Amlodipine, Prochlorperazine edisylate, Aspirin, Cymbalta [duloxetine hcl], and Pamelor [nortriptyline hcl]   Assessment / Plan:     Visit Diagnoses: Rheumatoid arthritis involving multiple joints ([place]) -patient has severe osteoarthritis with synovitis and some of the joints as described above.  Patient states  she had done very well on Plaquenil for several years.  The Plaquenil was discontinued about 4 years ago due to ocular toxicity.  According to patient her eye exam has been stable.  She had eye exam by Dr. Zadie Rhine this year in July.  She was placed on methotrexate couple of months ago.  She has not noticed any improvement on methotrexate.  She is also having nausea from methotrexate.  She would like to switch therapy.  We had detailed discussion regarding different treatment options and their side effects.  I discussed possible use of Arava.  Handout was given and consent was taken.  She may benefit from a small dose of Kariva taking 10 mg p.o. daily.  If she has any side effects we can even do 10 mg every other day.  We will check labs in 2 weeks after that and then 2 months.  Gwenevere Ghazi will be called in after we get all the lab results.  Plan: Rheumatoid factor, Cyclic citrul peptide antibody, IgG  Pain in both hands -she complains of pain in her bilateral hands.  She has severe osteoarthritis and rheumatoid arthritis overlap.  Plan: XR Hand 2 View Right, XR Hand 2 View Left.  X-rays were consistent with severe erosive rheumatoid arthritis and osteoarthritis overlap.  Chronic pain of both shoulders -she has bilateral frozen shoulders.  She complains of severe pain in her both shoulders.  Patient states she has had cortisone injections several years ago without much relief.  Plan: XR Shoulder Left, XR Shoulder Right.  X-ray showed severe glenohumeral joint space narrowing bilaterally.  Pain in both feet -she complains of some discomfort in her feet.  Not with synovitis was noted.  She has some osteoarthritic changes.  Plan: XR Foot 2 Views Right, XR Foot 2 Views Left.  X-rays are consistent with rheumatoid arthritis and osteoarthritis overlap.  High risk medication use -patient tried Plaquenil for several years and was discontinued due to ocular toxicity.  She has been on methotrexate for 2 months taking 2  tablets p.o. weekly and it is causing increased nausea.  She has an adequate response to methotrexate.  Plan: Hepatitis B core antibody, IgM, Hepatitis B surface antigen, Hepatitis B DNA, ultraquantitative, PCR, HIV Antibody (routine testing w rflx), IgG, IgA, IgM, CBC with Differential/Platelet, COMPLETE METABOLIC PANEL WITH GFR  Failure of right total hip arthroplasty, subsequent encounter-she has some discomfort with range of motion.  Sicca syndrome (HCC)-tolerable with over-the-counter medications.  Osteopenia, unspecified location-patient does not recall when her last bone density was.  Height loss-patient's height has significantly decreased.  According to her daughter who accompanied her today she used to be 5 feet 6 inches.  Feet today.  I discussed getting bone density.  She would like to get it to Dr. Lillie Fragmin office.  If she would bring bone density I will be happy to evaluated.  My inclination will be to put her on Forteo or Tymlos for rapid gain in the bone density.  Due to her height loss her left rib cage is also rubbing against her pelvis which is causing discomfort.  Other idiopathic scoliosis, thoracolumbar region  Sciatica of right  side-patient gives history of degenerative disc disease and has been followed by spine center where she has had cortisone injections in the past.  Other medical problems are listed as follows:  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HCC)  Constipation due to pain medication  Neuropathic pain  Elevated cholesterol  Anemia of chronic disease   Orders: Orders Placed This Encounter  Procedures  . XR Hand 2 View Right  . XR Hand 2 View Left  . XR Shoulder Left  . XR Shoulder Right  . XR Foot 2 Views Right  . XR Foot 2 Views Left  . Hepatitis B core antibody, IgM  . Hepatitis B surface antigen  . Hepatitis B DNA, ultraquantitative, PCR  . HIV Antibody (routine testing w rflx)  . IgG, IgA, IgM  . CBC with Differential/Platelet  . COMPLETE  METABOLIC PANEL WITH GFR  . Rheumatoid factor  . Cyclic citrul peptide antibody, IgG   No orders of the defined types were placed in this encounter.   Face-to-face time spent with patient was 60 minutes. Greater than 50% of time was spent in counseling and coordination of care.  Follow-Up Instructions: Return for Rheumatoid arthritis, Osteoarthritis.   Bo Merino, MD  Note - This record has been created using Editor, commissioning.  Chart creation errors have been sought, but may not always  have been located. Such creation errors do not reflect on  the standard of medical care.

## 2018-10-13 ENCOUNTER — Other Ambulatory Visit: Payer: Self-pay | Admitting: Family Medicine

## 2018-10-21 ENCOUNTER — Ambulatory Visit: Payer: Self-pay

## 2018-10-21 ENCOUNTER — Other Ambulatory Visit: Payer: Self-pay

## 2018-10-21 ENCOUNTER — Encounter: Payer: Self-pay | Admitting: Rheumatology

## 2018-10-21 ENCOUNTER — Ambulatory Visit (INDEPENDENT_AMBULATORY_CARE_PROVIDER_SITE_OTHER): Payer: Medicare Other | Admitting: Rheumatology

## 2018-10-21 VITALS — BP 152/86 | HR 72 | Resp 14 | Ht 60.0 in | Wt 105.0 lb

## 2018-10-21 DIAGNOSIS — G8929 Other chronic pain: Secondary | ICD-10-CM

## 2018-10-21 DIAGNOSIS — M8580 Other specified disorders of bone density and structure, unspecified site: Secondary | ICD-10-CM

## 2018-10-21 DIAGNOSIS — M79672 Pain in left foot: Secondary | ICD-10-CM | POA: Diagnosis not present

## 2018-10-21 DIAGNOSIS — Z79899 Other long term (current) drug therapy: Secondary | ICD-10-CM

## 2018-10-21 DIAGNOSIS — R29890 Loss of height: Secondary | ICD-10-CM

## 2018-10-21 DIAGNOSIS — M792 Neuralgia and neuritis, unspecified: Secondary | ICD-10-CM

## 2018-10-21 DIAGNOSIS — E7800 Pure hypercholesterolemia, unspecified: Secondary | ICD-10-CM

## 2018-10-21 DIAGNOSIS — M5431 Sciatica, right side: Secondary | ICD-10-CM

## 2018-10-21 DIAGNOSIS — M25511 Pain in right shoulder: Secondary | ICD-10-CM

## 2018-10-21 DIAGNOSIS — M79671 Pain in right foot: Secondary | ICD-10-CM

## 2018-10-21 DIAGNOSIS — D638 Anemia in other chronic diseases classified elsewhere: Secondary | ICD-10-CM

## 2018-10-21 DIAGNOSIS — M79641 Pain in right hand: Secondary | ICD-10-CM

## 2018-10-21 DIAGNOSIS — E270 Other adrenocortical overactivity: Secondary | ICD-10-CM

## 2018-10-21 DIAGNOSIS — M25512 Pain in left shoulder: Secondary | ICD-10-CM | POA: Diagnosis not present

## 2018-10-21 DIAGNOSIS — T84010D Broken internal right hip prosthesis, subsequent encounter: Secondary | ICD-10-CM

## 2018-10-21 DIAGNOSIS — M79642 Pain in left hand: Secondary | ICD-10-CM

## 2018-10-21 DIAGNOSIS — K5903 Drug induced constipation: Secondary | ICD-10-CM

## 2018-10-21 DIAGNOSIS — M4125 Other idiopathic scoliosis, thoracolumbar region: Secondary | ICD-10-CM

## 2018-10-21 DIAGNOSIS — M069 Rheumatoid arthritis, unspecified: Secondary | ICD-10-CM | POA: Diagnosis not present

## 2018-10-21 DIAGNOSIS — M3500 Sicca syndrome, unspecified: Secondary | ICD-10-CM

## 2018-10-21 NOTE — Progress Notes (Signed)
Pharmacy Note  Subjective: Patient presents today to the [facility] to see Dr. Estanislado Pandy. She is accompanied by her daughter. Patient seen by the pharmacist for counseling on leflunomide Jolee Ewing) for rheumatoid arthritis.  She was started on methotrexate by another provider but unable to tolerate.  She was also treated with Plaquenil in the past.  Objective: CBC    Component Value Date/Time   WBC 4.6 06/08/2018 1119   RBC 3.99 06/08/2018 1119   HGB 11.9 (L) 06/08/2018 1119   HGB 8.0 (L) 02/22/2018 1017   HGB 11.0 (L) 08/21/2016 1125   HCT 36.0 06/08/2018 1119   HCT 33.6 (L) 08/21/2016 1125   PLT 186.0 06/08/2018 1119   PLT 167 02/22/2018 1017   PLT 160 08/21/2016 1125   PLT 150 09/22/2008   MCV 90.2 06/08/2018 1119   MCV 88.9 08/21/2016 1125   MCH 29.0 04/17/2018 0659   MCHC 33.1 06/08/2018 1119   RDW 15.5 06/08/2018 1119   RDW 13.0 08/21/2016 1125   LYMPHSABS 0.6 (L) 04/17/2018 0659   LYMPHSABS 1.0 08/21/2016 1125   MONOABS 1.1 (H) 04/17/2018 0659   MONOABS 0.5 08/21/2016 1125   EOSABS 0.1 04/17/2018 0659   EOSABS 0.0 08/21/2016 1125   BASOSABS 0.0 04/17/2018 0659   BASOSABS 0.0 08/21/2016 1125    CMP     Component Value Date/Time   NA 132 (L) 06/08/2018 1119   NA 130 (L) 08/21/2016 1125   K 4.8 06/08/2018 1119   K 4.8 08/21/2016 1125   CL 97 06/08/2018 1119   CO2 29 06/08/2018 1119   CO2 26 08/21/2016 1125   GLUCOSE 96 06/08/2018 1119   GLUCOSE 82 08/21/2016 1125   GLUCOSE 85 09/22/2008   BUN 31 (H) 06/08/2018 1119   BUN 25.6 08/21/2016 1125   CREATININE 1.09 06/08/2018 1119   CREATININE 1.44 (H) 02/22/2018 1017   CREATININE 1.27 (H) 06/01/2017 1353   CREATININE 1.2 (H) 08/21/2016 1125   CALCIUM 9.5 06/08/2018 1119   CALCIUM 10.0 08/21/2016 1125   PROT 5.2 (L) 04/17/2018 0659   PROT 7.4 08/21/2016 1125   ALBUMIN 2.4 (L) 04/17/2018 0659   ALBUMIN 4.0 08/21/2016 1125   AST 21 04/17/2018 0659   AST 23 02/22/2018 1017   AST 27 08/21/2016 1125    ALT 10 04/17/2018 0659   ALT 11 02/22/2018 1017   ALT 13 08/21/2016 1125   ALKPHOS 57 04/17/2018 0659   ALKPHOS 54 08/21/2016 1125   BILITOT 0.8 04/17/2018 0659   BILITOT 0.7 02/22/2018 1017   BILITOT 0.31 08/21/2016 1125   GFRNONAA 51 (L) 04/17/2018 0659   GFRNONAA 33 (L) 02/22/2018 1017   GFRAA 59 (L) 04/17/2018 0659   GFRAA 38 (L) 02/22/2018 1017    Baseline Immunosuppressant Therapy Labs TB Gold: negative 07/16/2018  Hepatitis panel: Hep C negative on 01/15/2014; Hep B pending  HIV: pending 10/21/2018  Immunoglobulins: pending 10/21/2018  SPEP:within normal limits 02/06/15  No results found for: G6PDH  No results found for: TPMT   Pregnancy status:  Hysterectomy  Assessment/Plan:  Patient was counseled on the purpose, proper use, and adverse effects of leflunomide including risk of infection, nausea/diarrhea/weight loss, increase in blood pressure, rash, hair loss, tingling in the hands and feet, and signs and symptoms of interstitial lung disease.   Also counseled on [place] warning of liver injury and importance of avoiding alcohol while on therapy.  She does not drink Discussed that there is the possibility of an increased risk of malignancy but  it is not well understood if this increased risk is due to the medication or the disease state.  Counseled patient to avoid live vaccines. Recommend annual influenza, Pneumovax 23, Prevnar 13, and Shingrix as indicated.   Discussed the importance of frequent monitoring of liver function and blood count.  Standing orders placed.  Discussed importance of birth control while on leflunomide due to risk of congenital abnormalities, and patient confirms hysterectomy.  Provided patient with educational materials on leflunomide and answered all questions.  Patient consented to [place] use, and consent will be uploaded into the media tab.    Patient dose will be 10 mg daily due to decreased kidney function.  Prescription pending lab results  and/or insurance approval.  All questions encouraged and answered.  Instructed patient to call with any other questions or concerns.  Mariella Saa, PharmD, [place], [facility] Pharmacist [phone]  10/21/2018 12:08 PM

## 2018-10-23 LAB — CBC WITH DIFFERENTIAL/PLATELET
Absolute Monocytes: 463 cells/uL (ref 200–950)
Basophils Absolute: 21 cells/uL (ref 0–200)
Basophils Relative: 0.4 %
Eosinophils Absolute: 52 cells/uL (ref 15–500)
Eosinophils Relative: 1 %
HCT: 34.5 % — ABNORMAL LOW (ref 35.0–45.0)
Hemoglobin: 11.3 g/dL — ABNORMAL LOW (ref 11.7–15.5)
Lymphs Abs: 1030 cells/uL (ref 850–3900)
MCH: 30.6 pg (ref 27.0–33.0)
MCHC: 32.8 g/dL (ref 32.0–36.0)
MCV: 93.5 fL (ref 80.0–100.0)
MPV: 10.2 fL (ref 7.5–12.5)
Monocytes Relative: 8.9 %
Neutro Abs: 3635 cells/uL (ref 1500–7800)
Neutrophils Relative %: 69.9 %
Platelets: 185 10*3/uL (ref 140–400)
RBC: 3.69 10*6/uL — ABNORMAL LOW (ref 3.80–5.10)
RDW: 13 % (ref 11.0–15.0)
Total Lymphocyte: 19.8 %
WBC: 5.2 10*3/uL (ref 3.8–10.8)

## 2018-10-23 LAB — COMPLETE METABOLIC PANEL WITH GFR
AG Ratio: 1.6 (calc) (ref 1.0–2.5)
ALT: 14 U/L (ref 6–29)
AST: 25 U/L (ref 10–35)
Albumin: 4.3 g/dL (ref 3.6–5.1)
Alkaline phosphatase (APISO): 52 U/L (ref 37–153)
BUN/Creatinine Ratio: 27 (calc) — ABNORMAL HIGH (ref 6–22)
BUN: 28 mg/dL — ABNORMAL HIGH (ref 7–25)
CO2: 27 mmol/L (ref 20–32)
Calcium: 9.6 mg/dL (ref 8.6–10.4)
Chloride: 99 mmol/L (ref 98–110)
Creat: 1.02 mg/dL — ABNORMAL HIGH (ref 0.60–0.88)
GFR, Est African American: 58 mL/min/{1.73_m2} — ABNORMAL LOW (ref >=60)
GFR, Est Non African American: 50 mL/min/{1.73_m2} — ABNORMAL LOW (ref >=60)
Globulin: 2.7 g/dL (calc) (ref 1.9–3.7)
Glucose, Bld: 84 mg/dL (ref 65–99)
Potassium: 5 mmol/L (ref 3.5–5.3)
Sodium: 134 mmol/L — ABNORMAL LOW (ref 135–146)
Total Bilirubin: 0.3 mg/dL (ref 0.2–1.2)
Total Protein: 7 g/dL (ref 6.1–8.1)

## 2018-10-23 LAB — IGG, IGA, IGM
IgG (Immunoglobin G), Serum: 1353 mg/dL (ref 600–1540)
IgM, Serum: 87 mg/dL (ref 50–300)
Immunoglobulin A: 59 mg/dL — ABNORMAL LOW (ref 70–320)

## 2018-10-23 LAB — HEPATITIS B SURFACE ANTIGEN: Hepatitis B Surface Ag: NONREACTIVE

## 2018-10-23 LAB — RHEUMATOID FACTOR: Rheumatoid fact SerPl-aCnc: <14 IU/mL (ref <14)

## 2018-10-23 LAB — HEPATITIS B DNA, ULTRAQUANTITATIVE, PCR
Hepatitis B DNA (Calc): <1 Log IU/mL
Hepatitis B DNA: <10 IU/mL

## 2018-10-23 LAB — CYCLIC CITRUL PEPTIDE ANTIBODY, IGG: Cyclic Citrullin Peptide Ab: <16 UNITS

## 2018-10-23 LAB — HIV ANTIBODY (ROUTINE TESTING W REFLEX): HIV 1&2 Ab, 4th Generation: NONREACTIVE

## 2018-10-23 LAB — HEPATITIS B CORE ANTIBODY, IGM: Hep B C IgM: NONREACTIVE

## 2018-10-24 ENCOUNTER — Encounter: Payer: Self-pay | Admitting: Rheumatology

## 2018-10-26 ENCOUNTER — Telehealth: Payer: Self-pay | Admitting: Rheumatology

## 2018-10-26 MED ORDER — DICLOFENAC SODIUM 3 % TD GEL: TRANSDERMAL | 2 refills | Status: DC

## 2018-10-26 NOTE — Telephone Encounter (Signed)
Mitzi Hansen, pharmacist at [facility] called stating they received the prescription of Diclofenac Sodium for the patient.  Mitzi Hansen states the amount needs to be in grams.  Please call back at [phone]

## 2018-10-26 NOTE — Progress Notes (Signed)
Office Visit Note  Patient: [name]             Date of Birth: Jul 15, 1930           MRN: [id number]             PCP: Darreld Mclean, MD Referring: Darreld Mclean, MD Visit Date: 11/09/2018 Occupation: _0 @  Subjective:  Joint pain and trigger fingers.  History of Present Illness: [name] is a 85 y.o. female with history of seronegative rheumatoid arthritis.  She started taking Arava on October 30 2018.  She has not experienced any side effects and she is pleased about that.  She continues to have discomfort in her both shoulder and her hands.  She states her right shoulder is extremely painful and has difficulty doing routine activities because of that.  She also has some discomfort over her left trochanteric area.  She has scoliosis and chronic back discomfort.  She reports her right middle and ring trigger finger.  Activities of Daily Living:  Patient reports morning stiffness for 1 hour.   Patient Denies nocturnal pain.  Difficulty dressing/grooming: Reports Difficulty climbing stairs: Reports Difficulty getting out of chair: Reports Difficulty using hands for taps, buttons, cutlery, and/or writing: Reports  Review of Systems  Constitutional: Negative for fatigue, night sweats, weight gain and weight loss.  HENT: Positive for mouth dryness and nose dryness. Negative for mouth sores, trouble swallowing and trouble swallowing.   Eyes: Positive for dryness. Negative for pain, redness, itching and visual disturbance.  Respiratory: Negative for cough, shortness of breath, wheezing and difficulty breathing.   Cardiovascular: Negative for chest pain, palpitations, hypertension, irregular heartbeat and swelling in legs/feet.  Gastrointestinal: Negative for blood in stool, constipation and diarrhea.  Endocrine: Negative for increased urination.  Genitourinary: Negative for difficulty urinating, painful urination and vaginal dryness.  Musculoskeletal:  Positive for arthralgias, joint pain, joint swelling and morning stiffness. Negative for myalgias, muscle weakness, muscle tenderness and myalgias.  Skin: Negative for color change, rash, hair loss, skin tightness, ulcers and sensitivity to sunlight.  Allergic/Immunologic: Negative for susceptible to infections.  Neurological: Negative for dizziness, numbness, headaches, memory loss, night sweats and weakness.  Hematological: Negative for bruising/bleeding tendency and swollen glands.  Psychiatric/Behavioral: Negative for depressed mood and sleep disturbance. The patient is not nervous/anxious.     PMFS History:  Patient Active Problem List   Diagnosis Date Noted  . Popliteal artery occlusion, right ([place]) 04/10/2018  . HTN (hypertension) 04/10/2018  . Femoral artery pseudo-aneurysm, right ([place]) 04/09/2018  . Anemia of chronic disease 02/24/2018  . Unstable right hip arthroplasty 01/24/2018  . History of revision of total replacement of right hip joint 01/24/2018  . Hypovolemic shock ([place])   . Hyperkalemia   . Hyponatremia   . Failed total hip arthroplasty ([place]) 11/27/2017  . Status post revision of total hip 11/27/2017  . Elevated cholesterol 10/11/2015  . 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[place]) 10/04/2014  . Bilateral leg edema 08/01/2014  . Elevated BP 08/01/2014  . Rheumatoid arthritis involving multiple joints ([place]) 05/30/2014  . Osteoarthritis of right hip 04/28/2014  . Status post total replacement of right hip 04/28/2014  . Long-term use of high-risk medication 11/11/2013  . Routine general medical examination at a health care facility 11/11/2013  . Symptomatic anemia 11/11/2013  . Neuropathic pain 07/07/2013  . Constipation due to pain medication 07/07/2013  . Protein-calorie malnutrition, severe ([place]) 06/08/2013  . Lung cancer, Right upper lobe 05/08/2013  . Sciatica  of right side 08/13/2011  . Osteopenia 03/07/2010  . PARESTHESIA 03/07/2010  . CT, CHEST, ABNORMAL 12/18/2008   . ABNORMAL ECHOCARDIOGRAM 12/14/2008  . [place] SYNDROME 11/29/2008  . HYPOGLYCEMIA 06/29/2006  . RAYNAUD'S DISEASE 06/29/2006    Past Medical History:  Diagnosis Date  . Arthralgia of multiple joints    followed by dr Gerilyn Nestle  . Arthritis   . Cardiomyopathy ([place])   . Chronic constipation   . Chronic inflammatory arthritis    rhemotolgist-  dr a. Gerilyn Nestle [facility] [place] [facility])  . Dry eyes    eye drops used   . GERD (gastroesophageal reflux disease)   . H/O discoid lupus erythematosus   . Hiatal hernia   . History of colon polyps   . Hypothyroidism   . Iron deficiency anemia   . LBBB (left bundle branch block) 2010  . Mitchell's disease (erythromelalgia) [facility])    neurologist-  dr patel  . Nocturia   . Non-small cell cancer of right lung [facility]) surgeon-- dr gerhardt/  oncologist-  dr Julien Nordmann--- per lov notes no recurrence/   11-18-2017 per pt denies any symptoms   dx 2015--  Stage IIA (T2b,N0,M0) , +EGFR  mutation in exon 21, non-small cell adenocarcinoma right upper lobe---  s/p  Right upper lobectomy , right middley wedge resection and node dissection---  no chemo or radiation therapy  . OA (osteoarthritis)    hands  . Osteoporosis   . PONV (postoperative nausea and vomiting)    likes phenergan  . Raynaud's phenomenon 1965  . Renal insufficiency   . Rheumatoid arthritis ([place])   . Sciatica   . Scoliosis   . Sjogren's syndrome ([place])     Family History  Problem Relation Age of Onset  . Coronary artery disease Father   . Colon cancer Father   . Diabetes Father   . Cancer Father        colon  . Other Mother 40       MVA  . Healthy Sister   . Healthy Brother   . Healthy Daughter   . Hypothyroidism Daughter   . Other Brother        killed in war  . Pneumonia Sister   . Healthy Daughter   . Esophageal cancer Neg Hx   . Kidney disease Neg Hx   . Liver disease Neg Hx    Past Surgical History:  Procedure Laterality Date  . ANTERIOR HIP REVISION Right 11/27/2017    Procedure: RIGHT HIP ACETABULAR REVISION;  Surgeon: [name], MD;  Location: [facility];  Service: Orthopedics;  Laterality: Right;  . ANTERIOR HIP REVISION Right 01/24/2018   Procedure: OPEN REDUCTION OF DISLOCATED ANTERIOR HIP WITH REVISION OF LINER AND HIP BALL;  Surgeon: [name], MD;  Location: [facility];  Service: Orthopedics;  Laterality: Right;  . APPENDECTOMY  1950s  . BIOPSY  04/14/2018   Procedure: BIOPSY;  Surgeon: Yetta Flock, MD;  Location: [place];  Service: Gastroenterology;;  . BIOPSY  04/16/2018   Procedure: BIOPSY;  Surgeon: Irving Copas., MD;  Location: [facility];  Service: Gastroenterology;;  . CARDIOVASCULAR STRESS TEST  12/2008    mild fixed basal to mid septal perfusion defect felt likely due to artifact from LBBB, no ischemia, EF 58%  . COLONOSCOPY    . COLONOSCOPY WITH PROPOFOL N/A 04/16/2018   Procedure: COLONOSCOPY WITH PROPOFOL;  Surgeon: Rush Landmark Telford Nab., MD;  Location: [place];  Service: Gastroenterology;  Laterality: N/A;  . ESOPHAGOGASTRODUODENOSCOPY (EGD)  WITH PROPOFOL N/A 04/14/2018   Procedure: ESOPHAGOGASTRODUODENOSCOPY (EGD) WITH PROPOFOL;  Surgeon: Yetta Flock, MD;  Location: [place];  Service: Gastroenterology;  Laterality: N/A;  . FEMORAL-POPLITEAL BYPASS GRAFT Right 04/10/2018   Procedure: REPAIR RIGHT FEMORAL ARTERY PSEUDOANEURYSM, RETROPERITONEAL EXPOSURE OF ILIAC ARTERY, RIGHT POPLITEAL EMBOLECTOMY;  Surgeon: Angelia Mould, MD;  Location: [place];  Service: Vascular;  Laterality: Right;  . LYMPH NODE DISSECTION Right 06/07/2013   Procedure: LYMPH NODE DISSECTION;  Surgeon: [name], MD;  Location: [place];  Service: Thoracic;  Laterality: Right;  . PATCH ANGIOPLASTY Right 04/10/2018   Procedure: PATCH  ANGIOPLASTY OF RIGHT FEMORAL ARTERY USING BOVINE PATCH, PATCH ANGIOPLASTY OF RIGHT POPLITEAL ARTERY USING BOVINE PATCH;  Surgeon: Angelia Mould, MD;  Location:  [place];  Service: Vascular;  Laterality: Right;  . [place]   "large incision from chest to up to shoulder, the nerves were tied together, for raynaud's  . THORACOTOMY  06/07/2013   Procedure: MINI/LIMITED THORACOTOMY; right middle lobe wedge resection;  Surgeon: [name], MD;  Location: [place];  Service: Thoracic;;  . TONSILLECTOMY  child  . TOTAL ABDOMINAL HYSTERECTOMY  1980's    W/ BSO  . TOTAL HIP ARTHROPLASTY Right 04/28/2014   Procedure: RIGHT TOTAL HIP ARTHROPLASTY ANTERIOR APPROACH;  Surgeon: [name], MD;  [facility];  Service: Orthopedics;  Laterality: Right;  . TRANSTHORACIC ECHOCARDIOGRAM  12/11/2008   ef 45-85%, grade 1 diastolic dysfunction/  mild LAE/  mild AR and MR/  trivial TR  . VIDEO ASSISTED THORACOSCOPY (VATS)/WEDGE RESECTION Right 06/07/2013   Procedure: VIDEO ASSISTED THORACOSCOPY (VATS)/right upper lobectomy, On Q;  Surgeon: [name], MD;  Location: [place];  Service: Thoracic;  Laterality: Right;  [place] VIDEO BRONCHOSCOPY N/A 06/07/2013   Procedure: VIDEO BRONCHOSCOPY;  Surgeon: [name], MD;  [facility];  Service: Thoracic;  Laterality: N/A;  . VIDEO BRONCHOSCOPY WITH ENDOBRONCHIAL NAVIGATION N/A 05/04/2013   Procedure: VIDEO BRONCHOSCOPY WITH ENDOBRONCHIAL NAVIGATION;  Surgeon: [name], MD;  Location: [place];  Service: Thoracic;  Laterality: N/A;   Social History   Social History Narrative   Lives with husband, daughter and grandchild local.   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 Fluad Quad(high Dose 65+) 11/03/2018  . Influenza Split 11/21/2011  . Influenza Whole 11/29/2007, 11/29/2008, 11/29/2009  . Influenza, High Dose Seasonal PF 10/30/2012, 01/02/2015  . Influenza,inj,Quad PF,6+ Mos 11/22/2013, 11/14/2015  . Influenza-Unspecified 11/13/2016, 11/12/2017  . Pneumococcal Conjugate-13  05/22/2015  . Pneumococcal Polysaccharide-23 06/13/2013  . Tdap 08/17/2017  . Zoster 01/27/2014     Objective: Vital Signs: BP (!) 154/86 (BP Location: Left Arm, Patient Position: Sitting, Cuff Size: Normal)   Pulse 76   Resp 13   Ht 5' (1.524 m)   Wt 106 lb 6.4 oz (48.3 kg)   BMI 20.78 kg/m    Physical Exam Vitals signs and nursing note reviewed.  Constitutional:      Appearance: She is well-developed.  HENT:     Head: Normocephalic and atraumatic.  Eyes:     Conjunctiva/sclera: Conjunctivae normal.  Neck:     Musculoskeletal: Normal range of motion.  Cardiovascular:     Rate and Rhythm: Normal rate and regular rhythm.     Heart sounds: Normal heart sounds.  Pulmonary:     Effort: Pulmonary effort is normal.     Breath sounds: Normal  breath sounds.  Abdominal:     General: Bowel sounds are normal.     Palpations: Abdomen is soft.  Lymphadenopathy:     Cervical: No cervical adenopathy.  Skin:    General: Skin is warm and dry.     Capillary Refill: Capillary refill takes less than 2 seconds.  Neurological:     Mental Status: She is alert and oriented to person, place, and time.  Psychiatric:        Behavior: Behavior normal.      Musculoskeletal Exam: C-spine was in good range of motion.  She has severe scoliosis and kyphosis which causes some discomfort.  She also complains of some discomfort on the left side of her rib cage where it touches the iliac spine.  She has about 30 degree painful abduction of bilateral shoulders.  She has discomfort range of motion of her elbows.  She has synovitis in her MCPs as described below.  Chronic changes in PIPs and DIPs and subluxation of most of her joints was noted.  She had painful range of motion of her right hip joint.  Knee joints are in good range of motion with no synovitis.  Some tenderness was noted over MTPs and PIPs as described below.  CDAI Exam: CDAI Score: 11.2  Patient Global: 6 mm; Provider Global: 6 mm Swollen:  4 ; Tender: 9  Joint Exam      Right  Left  Glenohumeral   Tender   Tender  MCP 2  Swollen Tender  Swollen Tender  MCP 3  Swollen Tender  Swollen Tender  Hip   Tender     MTP 1      Tender  MTP 2   Tender        Investigation: No additional findings.  Imaging: Xr Foot 2 Views Left  Result Date: 10/21/2018 First MTP, PIP and DIP joint space narrowing was noted.  No intertarsal joint space narrowing was noted.  Subtalar joint space narrowing was noted.  No erosive changes were noted.  Juxta-articular osteopenia was noted.  Impression: These findings are consistent with rheumatoid arthritis and osteoarthritis overlap.  Xr Foot 2 Views Right  Result Date: 10/21/2018 First, second third and fourth MTP joint space narrowing was noted.  Cystic changes were noted in the MTPs.  Possible erosion was noted in the second metatarsal.  PIP and DIP narrowing was noted.  No intertarsal joint space narrowing was noted.  Tibiotalar joint space narrowing was noted. Impression: These findings are consistent with rheumatoid arthritis and osteoarthritis overlap.  Xr Hand 2 View Left  Result Date: 10/21/2018 Subluxation of the [facility] joint was noted.  Subluxation of first MCP joint was noted.  Narrowing of all MCP joints was noted.  Erosive changes were noted in the second MCP joint.  PIP and DIP narrowing was noted.  Erosive changes were noted in the DIPs.  No significant intercarpal radiocarpal joint space narrowing was noted.  Juxta-articular osteopenia was noted. Impression: These findings are consistent with severe erosive rheumatoid arthritis and osteoarthritis.  Xr Hand 2 View Right  Result Date: 10/21/2018 Juxta-articular osteopenia was noted.  Subluxation of CMC and first MCP joint was noted.  Severe narrowing of all MCP joints with invagination of third MCP joint was noted.  PIP and DIP narrowing was noted.  Subluxation of all DIP joints was noted.  No significant intercarpal radiocarpal joint space  narrowing was noted.  Erosive changes were noted in MCP joints. Impression: These findings are consistent with  severe rheumatoid arthritis and osteoarthritis overlap.  Xr Shoulder Left  Result Date: 10/21/2018 Severe glenohumeral joint space narrowing was noted.  Cystic changes in the humerus was noted.  Acromioclavicular joint narrowing was noted. Impression: Severe arthritis of the shoulder joint most likely due to rheumatoid arthritis  Xr Shoulder Right  Result Date: 10/21/2018 Severe glenohumeral joint narrowing was noted.  Erosive changes were noted on the humeral surface.  No chondrocalcinosis was noted. Impression: Severe rheumatoid arthritis of the shoulder joint.   Recent Labs: Lab Results  Component Value Date   WBC 5.2 10/21/2018   HGB 11.3 (L) 10/21/2018   PLT 185 10/21/2018   NA 134 (L) 10/21/2018   K 5.0 10/21/2018   CL 99 10/21/2018   CO2 27 10/21/2018   GLUCOSE 84 10/21/2018   BUN 28 (H) 10/21/2018   CREATININE 1.02 (H) 10/21/2018   BILITOT 0.3 10/21/2018   ALKPHOS 57 04/17/2018   AST 25 10/21/2018   ALT 14 10/21/2018   PROT 7.0 10/21/2018   ALBUMIN 2.4 (L) 04/17/2018   CALCIUM 9.6 10/21/2018   GFRAA 58 (L) 10/21/2018   October 21, 2018 RF negative, anti-CCP negative, hepatitis B-, hepatitis C negative, immunoglobulin IgA low  Speciality Comments: No specialty comments available.  Procedures:  No procedures performed Allergies: Amlodipine, Prochlorperazine edisylate, Aspirin, Cymbalta [duloxetine hcl], and Pamelor [nortriptyline hcl]   Assessment / Plan:     Visit Diagnoses: Rheumatoid arthritis of multiple sites with negative rheumatoid factor (HCC) - RF negative, anti-CCP negative, treated with Plaquenil for several years which was discontinued due to ocular toxicity.  She had an adequate response to methotrexate when treated by Dr. Earnest Conroy.  She was started on [place] on September 12.  She has been tolerating the medication well so far.  Although she has not  noticed much improvement that quickly.  She continues to have a lot of discomfort in her joints especially her shoulder joints which are end-stage.  The plan is to continue Arava at 10 mg p.o. daily until her follow-up visit in 6 weeks.  If she has an adequate response to Arava 10 mg we may increase it to 20 mg or 20 mg alternating with 10 mg.  Rheumatoid arthritis involving both hands with negative rheumatoid factor (HCC)-she has severe end-stage rheumatoid arthritis.  Trigger finger, right middle finger-we may consider injecting with cortisone.  Trigger finger, right ring finger  Rheumatoid arthritis involving both shoulders with negative rheumatoid factor (HCC)-she has severe end-stage rheumatoid arthritis in her shoulders.  She has limited abduction.  Which will not improve even with the treatment.  High risk medication use - Arava 10 mg daily started on 10/30/2018.  Most recent CBC/CMP within normal limits except for anemia and elevated serum creatinine/decreased GFR on 10/21/2018.  She is due for CBC/CMP today to monitor for drug toxicity.  She will come back in 4 days.Plaquenil discontinued due to ocular toxicity, inadequate response to methotrexate. - Plan: CBC with Differential/Platelet, COMPLETE METABOLIC PANEL WITH GFR 2 weeks after start of therapy and then 2 months.  Failure of right total hip arthroplasty, subsequent encounter  Sicca syndrome (HCC)-over-the-counter products were discussed.  Most likely related to rheumatoid arthritis.  Osteopenia, unspecified location-Need most recent DEXA results from PCP.  Last DEXA on file from 11/05/2011 ordered by Dr. Birdie Riddle showed T score of -2.4 at left femur neck with -4.2% change in BMD compared to prior DEXA in November 2009.  She needs a repeat bone density.  She also had some height  loss.  Patient wants to get bone density through her PCPs office.  Height loss  Other idiopathic scoliosis, thoracolumbar region-she has chronic back discomfort.   She also has discomfort in the left rib cage.  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HCC)  Elevated cholesterol  Anemia of chronic disease  Sciatica of right side  Orders: Orders Placed This Encounter  Procedures  . CBC with Differential/Platelet  . COMPLETE METABOLIC PANEL WITH GFR   No orders of the defined types were placed in this encounter.    Follow-Up Instructions: Return in about 6 weeks (around 12/21/2018) for Rheumatoid arthritis.   Bo Merino, MD  Note - This record has been created using Editor, commissioning.  Chart creation errors have been sought, but may not always  have been located. Such creation errors do not reflect on  the standard of medical care.

## 2018-10-26 NOTE — Telephone Encounter (Signed)
Last Visit: 10/21/18 Next Visit: 11/09/18  Okay to refill per Dr. Estanislado Pandy

## 2018-10-26 NOTE — Progress Notes (Signed)
Discussed results at the follow-up visit.

## 2018-10-27 NOTE — Telephone Encounter (Signed)
Spoke with pharmacist and advised that it is 0.5 g.

## 2018-10-28 ENCOUNTER — Telehealth: Payer: Self-pay | Admitting: Rheumatology

## 2018-10-28 NOTE — Telephone Encounter (Signed)
Patient called checking on her prescription of [place].  Patient states Dr. Estanislado Pandy was waiting on her labwork before sending it to the pharmacy.  Patient states she had labwork on 10/21/18 and the results are in her mychart.  Patient is requesting the prescription be sent to [facility] [address] in [place].

## 2018-10-29 MED ORDER — LEFLUNOMIDE 10 MG PO TABS: 10.0000 mg | ORAL_TABLET | Freq: Every day | ORAL | 2 refills | Status: DC

## 2018-10-29 NOTE — Telephone Encounter (Signed)
Per office not on 10/21/18: Arava 10 mg po daily.

## 2018-11-02 ENCOUNTER — Other Ambulatory Visit: Payer: Self-pay

## 2018-11-02 NOTE — Progress Notes (Signed)
[place] [facility] [address] [place], [place] [address] [phone]  Date:  11/03/2018   Name:  [name]   DOB:  06/29/1930   MRN:  [id number]  PCP:  Darreld Mclean, MD    Chief Complaint: Wellness check   History of Present Illness:  [name] is a 85 y.o. very pleasant female patient who presents with the following:  Very sharp elderly lady here today for periodic recheck visit History of lung cancer, hypertension Saw her most recently in March of this year-in late 2019 she had a revision of a right hip replacement, then fell and the hip dislocated.  She then required a repeat revision of the right hip in December 2019.  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  She underwent repair of the aneurysm and has done relatively well since  [place] recently asked me to refer her to Dr. Estanislado Pandy for a rheumatology consultation She was dx with both RA and OA in several joints per pt report today  She was seen earlier this month, on September 3.  They started her on [place] for her rheumatoid arthritis and so far it is working really well for her  She is tolerating this well without any serious SE   Lab Results  Component Value Date   TSH 3.84 02/18/2018   Her hip and aneurysm repair site are doing ok She has significant scoliosis in her spine which causes her left hip to protrude some- this annoys her but  Flu shot today  She recently had labs done per Dr D She prefers to do other labs such as a TSH later on   She is taking 2 ES tylenol and robaxin BID for her joint pain  She also uses gabapentin 300 am, 300 afternoon and 600 night  Recent potassium was ok   Patient Active Problem List   Diagnosis Date Noted  . Popliteal artery occlusion, right ([place]) 04/10/2018  . HTN (hypertension) 04/10/2018   . Femoral artery pseudo-aneurysm, right ([place]) 04/09/2018  . Anemia of chronic disease 02/24/2018  . Unstable right hip arthroplasty 01/24/2018  . History of revision of total replacement of right hip joint 01/24/2018  . Hypovolemic shock ([place])   . Hyperkalemia   . Hyponatremia   . Failed total hip arthroplasty ([place]) 11/27/2017  . Status post revision of total hip 11/27/2017  . Elevated cholesterol 10/11/2015  . 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[place]) 10/04/2014  . Bilateral leg edema 08/01/2014  . Elevated BP 08/01/2014  . Rheumatoid arthritis involving multiple joints ([place]) 05/30/2014  . Osteoarthritis of right hip 04/28/2014  . Status post total replacement of right hip 04/28/2014  . Long-term use of high-risk medication 11/11/2013  . Routine general medical examination at a health care facility 11/11/2013  . Symptomatic anemia 11/11/2013  . Neuropathic pain 07/07/2013  . Constipation due to pain medication 07/07/2013  . Protein-calorie malnutrition, severe ([place]) 06/08/2013  . Lung cancer, Right upper lobe 05/08/2013  . Sciatica of right side 08/13/2011  . Osteopenia 03/07/2010  . PARESTHESIA 03/07/2010  . CT, CHEST, ABNORMAL 12/18/2008  . ABNORMAL ECHOCARDIOGRAM 12/14/2008  . [place] SYNDROME 11/29/2008  . HYPOGLYCEMIA 06/29/2006  . RAYNAUD'S DISEASE 06/29/2006    Past Medical History:  Diagnosis Date  . Arthralgia of multiple joints    followed by dr Gerilyn Nestle  .  Arthritis   . Cardiomyopathy ([place])   . Chronic constipation   . Chronic inflammatory arthritis    rhemotolgist-  dr a. Gerilyn Nestle [facility] [place] [facility])  . Dry eyes    eye drops used   . GERD (gastroesophageal reflux disease)   . H/O discoid lupus erythematosus   . Hiatal hernia   . History of colon polyps   . Hypothyroidism   . Iron deficiency anemia   . LBBB (left bundle branch block) 2010  . Mitchell's disease (erythromelalgia) [facility])    neurologist-  dr patel  . Nocturia   . Non-small cell cancer of  right lung [facility]) surgeon-- dr gerhardt/  oncologist-  dr Julien Nordmann--- per lov notes no recurrence/   11-18-2017 per pt denies any symptoms   dx 2015--  Stage IIA (T2b,N0,M0) , +EGFR  mutation in exon 21, non-small cell adenocarcinoma right upper lobe---  s/p  Right upper lobectomy , right middley wedge resection and node dissection---  no chemo or radiation therapy  . OA (osteoarthritis)    hands  . Osteoporosis   . PONV (postoperative nausea and vomiting)    likes phenergan  . Raynaud's phenomenon 1965  . Renal insufficiency   . Rheumatoid arthritis ([place])   . Sciatica   . Scoliosis   . Sjogren's syndrome [facility])     Past Surgical History:  Procedure Laterality Date  . ANTERIOR HIP REVISION Right 11/27/2017   Procedure: RIGHT HIP ACETABULAR REVISION;  Surgeon: [name], MD;  Location: [facility];  Service: Orthopedics;  Laterality: Right;  . ANTERIOR HIP REVISION Right 01/24/2018   Procedure: OPEN REDUCTION OF DISLOCATED ANTERIOR HIP WITH REVISION OF LINER AND HIP BALL;  Surgeon: [name], MD;  Location: [facility];  Service: Orthopedics;  Laterality: Right;  . APPENDECTOMY  1950s  . BIOPSY  04/14/2018   Procedure: BIOPSY;  Surgeon: Yetta Flock, MD;  Location: [place];  Service: Gastroenterology;;  . BIOPSY  04/16/2018   Procedure: BIOPSY;  Surgeon: Irving Copas., MD;  Location: [facility]COPY;  Service: Gastroenterology;;  . CARDIOVASCULAR STRESS TEST  12/2008    mild fixed basal to mid septal perfusion defect felt likely due to artifact from LBBB, no ischemia, EF 58%  . COLONOSCOPY    . COLONOSCOPY WITH PROPOFOL N/A 04/16/2018   Procedure: COLONOSCOPY WITH PROPOFOL;  Surgeon: Rush Landmark Telford Nab., MD;  Location: [place];  Service: Gastroenterology;  Laterality: N/A;  . ESOPHAGOGASTRODUODENOSCOPY (EGD) WITH PROPOFOL N/A 04/14/2018   Procedure: ESOPHAGOGASTRODUODENOSCOPY (EGD) WITH PROPOFOL;  Surgeon: Yetta Flock, MD;  Location: [place];  Service: Gastroenterology;  Laterality: N/A;  . FEMORAL-POPLITEAL BYPASS GRAFT Right 04/10/2018   Procedure: REPAIR RIGHT FEMORAL ARTERY PSEUDOANEURYSM, RETROPERITONEAL EXPOSURE OF ILIAC ARTERY, RIGHT POPLITEAL EMBOLECTOMY;  Surgeon: Angelia Mould, MD;  Location: [place];  Service: Vascular;  Laterality: Right;  . LYMPH NODE DISSECTION Right 06/07/2013   Procedure: LYMPH NODE DISSECTION;  Surgeon: [name], MD;  Location: [place];  Service: Thoracic;  Laterality: Right;  . PATCH ANGIOPLASTY Right 04/10/2018   Procedure: PATCH  ANGIOPLASTY OF RIGHT FEMORAL ARTERY USING BOVINE PATCH, PATCH ANGIOPLASTY OF RIGHT POPLITEAL ARTERY USING BOVINE PATCH;  Surgeon: Angelia Mould, MD;  Location: [place];  Service: Vascular;  Laterality: Right;  . [place]   "large incision from chest to up to shoulder, the nerves were tied together, for raynaud's  . THORACOTOMY  06/07/2013   Procedure: MINI/LIMITED THORACOTOMY; right middle lobe wedge resection;  Surgeon: [name], MD;  Location: [place];  Service: Thoracic;;  . TONSILLECTOMY  child  . TOTAL ABDOMINAL HYSTERECTOMY  1980's    W/ BSO  . TOTAL HIP ARTHROPLASTY Right 04/28/2014   Procedure: RIGHT TOTAL HIP ARTHROPLASTY ANTERIOR APPROACH;  Surgeon: [name], MD;  [facility]: [facility];  Service: Orthopedics;  Laterality: Right;  . TRANSTHORACIC ECHOCARDIOGRAM  12/11/2008   ef 03-47%, grade 1 diastolic dysfunction/  mild LAE/  mild AR and MR/  trivial TR  . VIDEO ASSISTED THORACOSCOPY (VATS)/WEDGE RESECTION Right 06/07/2013   Procedure: VIDEO ASSISTED THORACOSCOPY (VATS)/right upper lobectomy, On Q;  Surgeon: [name], MD;  Location: [place];  Service: Thoracic;  Laterality: Right;  [place] VIDEO BRONCHOSCOPY N/A 06/07/2013   Procedure: VIDEO BRONCHOSCOPY;  Surgeon: [name], MD;  Location[facility] OR;  Service: Thoracic;  Laterality: N/A;  . VIDEO BRONCHOSCOPY WITH ENDOBRONCHIAL NAVIGATION N/A  05/04/2013   Procedure: VIDEO BRONCHOSCOPY WITH ENDOBRONCHIAL NAVIGATION;  Surgeon: [name], MD;  Location: [place];  Service: Thoracic;  Laterality: N/A;    Social History   Tobacco Use  . Smoking status: Never Smoker  . Smokeless tobacco: Never Used  Substance Use Topics  . Alcohol use: Not Currently    Frequency: Never  . Drug use: Never    Family History  Problem Relation Age of Onset  . Coronary artery disease Father   . Colon cancer Father   . Diabetes Father   . Cancer Father        colon  . Other Mother 1       MVA  . Healthy Sister   . Healthy Brother   . Healthy Daughter   . Hypothyroidism Daughter   . Other Brother        killed in war  . Pneumonia Sister   . Healthy Daughter   . Esophageal cancer Neg Hx   . Kidney disease Neg Hx   . Liver disease Neg Hx     Allergies  Allergen Reactions  . Amlodipine Rash  . Prochlorperazine Edisylate Anaphylaxis    Compazine--- tongue swells and rash  . Aspirin Other (See Comments)    nose bleeds. Cannot take NSAIDS   . Cymbalta [Duloxetine Hcl] Diarrhea, Nausea And Vomiting and Other (See Comments)    Increased blood pressure  . Pamelor [Nortriptyline Hcl] Diarrhea and Nausea Only    Increased Heart rate and BP    Medication list has been reviewed and updated.  Current Outpatient Medications on File Prior to Visit  Medication Sig Dispense Refill  . Acetaminophen (TYLENOL EXTRA STRENGTH PO) Take 2 tablets by mouth every 6 (six) hours as needed.    . Biotin 1000 MCG tablet Take 1,000 mcg by mouth daily.     . Calcium Carbonate-Vitamin D (CALCIUM 500 + D) 500-125 MG-UNIT TABS Take 1 tablet by mouth daily.     . diclofenac sodium (VOLTAREN) 1 % GEL Apply topically as needed.    . Diclofenac Sodium 3 % GEL Apply a pea sized amount to affected joint twice daily as needed. 100 g 2  . docusate sodium (STOOL SOFTENER) 100 MG capsule Take 100 mg by mouth 2 (two) times daily.      . furosemide (LASIX) 20 MG  tablet Take 1 tablet (20 mg total) by mouth daily.    [place] gabapentin (NEURONTIN) 300 MG capsule TAKE '300MG'$  IN THE MORNING, '300MG'$  IN THE AFTERNOON, AND '600MG'$  AT BEDTIME. (Patient taking differently: Take 300-600 mg  by mouth See admin instructions. Take '300mg'$  in the morning, '300mg'$  in the afternoon, and '600mg'$  at bedtime.) 120 capsule 5  . Glucosamine-Chondroit-Vit C-Mn (GLUCOSAMINE CHONDR 1500 COMPLX PO) Take 1 capsule by mouth daily.     [place] HYDROcodone-acetaminophen (NORCO/VICODIN) 5-325 MG tablet Take 1 tablet by mouth every 4 (four) hours as needed for severe pain. 40 tablet 0  . leflunomide (ARAVA) 10 MG tablet Take 1 tablet (10 mg total) by mouth daily. 30 tablet 2  . levothyroxine (SYNTHROID, LEVOTHROID) 88 MCG tablet TAKE 1 TABLET (88 MCG TOTAL) BY MOUTH DAILY BEFORE BREAKFAST. 90 tablet 1  . methocarbamol (ROBAXIN) 500 MG tablet TAKE 1 TABLET (500 MG TOTAL) BY MOUTH EVERY 6 (SIX) HOURS AS NEEDED FOR MUSCLE SPASMS. 40 tablet 0  . metoprolol succinate (TOPROL XL) 25 MG 24 hr tablet Take 0.5 tablets (12.5 mg total) by mouth daily. 45 tablet 3  . Multiple Vitamin (MULTIVITAMIN) tablet Take 1 tablet by mouth daily.      Mar[place] Kitchen omeprazole (PRILOSEC) 20 MG capsule TAKE 1 CAPSULE BY MOUTH 2 TIMES DAILY BEFORE A MEAL. 180 capsule 1  . ondansetron (ZOFRAN ODT) 4 MG disintegrating tablet Take 1 tablet (4 mg total) by mouth every 6 (six) hours as needed for nausea or vomiting. 30 tablet 0  . Probiotic Product (PROBIOTIC PO) Take by mouth daily.    [place] Kitchen senna-docusate (SENOKOT-S) 8.6-50 MG tablet Take 1 tablet by mouth at bedtime as needed for mild constipation. 30 tablet 0  . spironolactone (ALDACTONE) 25 MG tablet Take 12.5 mg by mouth every morning.   2   No current facility-administered medications on file prior to visit.     Review of Systems:  As per HPI- otherwise negative. No fever or chills, no chest pain or shortness of breath  Physical Examination: Vitals:   11/03/18 1052  BP: (!) 142/80  Pulse:  62  Resp: 16  Temp: (!) 97 F (36.1 C)   Vitals:   11/03/18 1052  Weight: 105 lb (47.6 kg)  Height: 5' (1.524 m)   Body mass index is 20.51 kg/m. Ideal Body Weight: Weight in (lb) to have BMI = 25: 127.7  GEN: WDWN, NAD, Non-toxic, A & O x 3, petite build, looks well  HEENT: Atraumatic, Normocephalic. Neck supple. No masses, No LAD. Ears and Nose: No external deformity. CV: RRR, No M/G/R. No JVD. No thrill. No extra heart sounds. PULM: CTA B, no wheezes, crackles, rhonchi. No retractions. No resp. distress. No accessory muscle use. ABD: S, NT, ND, +BS. No rebound. No HSM. EXTR: No c/c/e.  Evidence of significant joint changes in her hands NEURO Normal gait for patient, she does use a rolling walker PSYCH: Normally interactive. Conversant. Not depressed or anxious appearing.  Calm demeanor.    Assessment and Plan: Essential hypertension  Needs flu shot - Plan: Flu Vaccine QUAD High Dose(Fluad)  Medication management  Rheumatoid arthritis involving multiple joints (HCC)  Acquired hypothyroidism  Following up today.  Blood pressure currently under fine control for age Recently started [place] for rheumatoid arthritis, so far is tolerating well Given flu shot today Continue current dose of Synthroid, she prefers to have her TSH drawn at our next visit in January Doing well as far as her hip and femoral aneurysm, she sees various specialists for this issue  Signed Lamar Blinks, MD

## 2018-11-03 ENCOUNTER — Ambulatory Visit (INDEPENDENT_AMBULATORY_CARE_PROVIDER_SITE_OTHER): Payer: Medicare Other | Admitting: Family Medicine

## 2018-11-03 ENCOUNTER — Encounter: Payer: Self-pay | Admitting: Family Medicine

## 2018-11-03 VITALS — BP 142/80 | HR 62 | Temp 97.0°F | Resp 16 | Ht 60.0 in | Wt 105.0 lb

## 2018-11-03 DIAGNOSIS — Z79899 Other long term (current) drug therapy: Secondary | ICD-10-CM | POA: Diagnosis not present

## 2018-11-03 DIAGNOSIS — I10 Essential (primary) hypertension: Secondary | ICD-10-CM | POA: Diagnosis not present

## 2018-11-03 DIAGNOSIS — E039 Hypothyroidism, unspecified: Secondary | ICD-10-CM

## 2018-11-03 DIAGNOSIS — Z23 Encounter for immunization: Secondary | ICD-10-CM

## 2018-11-03 DIAGNOSIS — M069 Rheumatoid arthritis, unspecified: Secondary | ICD-10-CM | POA: Diagnosis not present

## 2018-11-03 NOTE — Patient Instructions (Addendum)
Great to see you again today  Please see me in January for a recheck visit and labs/ thyroid

## 2018-11-09 ENCOUNTER — Other Ambulatory Visit: Payer: Self-pay

## 2018-11-09 ENCOUNTER — Encounter: Payer: Self-pay | Admitting: Rheumatology

## 2018-11-09 ENCOUNTER — Ambulatory Visit (INDEPENDENT_AMBULATORY_CARE_PROVIDER_SITE_OTHER): Payer: Medicare Other | Admitting: Rheumatology

## 2018-11-09 VITALS — BP 154/86 | HR 76 | Resp 13 | Ht 60.0 in | Wt 106.4 lb

## 2018-11-09 DIAGNOSIS — D638 Anemia in other chronic diseases classified elsewhere: Secondary | ICD-10-CM

## 2018-11-09 DIAGNOSIS — M5431 Sciatica, right side: Secondary | ICD-10-CM

## 2018-11-09 DIAGNOSIS — M65331 Trigger finger, right middle finger: Secondary | ICD-10-CM | POA: Diagnosis not present

## 2018-11-09 DIAGNOSIS — M8580 Other specified disorders of bone density and structure, unspecified site: Secondary | ICD-10-CM

## 2018-11-09 DIAGNOSIS — T84010D Broken internal right hip prosthesis, subsequent encounter: Secondary | ICD-10-CM

## 2018-11-09 DIAGNOSIS — E7800 Pure hypercholesterolemia, unspecified: Secondary | ICD-10-CM

## 2018-11-09 DIAGNOSIS — M06041 Rheumatoid arthritis without rheumatoid factor, right hand: Secondary | ICD-10-CM | POA: Diagnosis not present

## 2018-11-09 DIAGNOSIS — M0609 Rheumatoid arthritis without rheumatoid factor, multiple sites: Secondary | ICD-10-CM

## 2018-11-09 DIAGNOSIS — M65341 Trigger finger, right ring finger: Secondary | ICD-10-CM | POA: Diagnosis not present

## 2018-11-09 DIAGNOSIS — E270 Other adrenocortical overactivity: Secondary | ICD-10-CM

## 2018-11-09 DIAGNOSIS — Z79899 Other long term (current) drug therapy: Secondary | ICD-10-CM

## 2018-11-09 DIAGNOSIS — M4125 Other idiopathic scoliosis, thoracolumbar region: Secondary | ICD-10-CM

## 2018-11-09 DIAGNOSIS — M06042 Rheumatoid arthritis without rheumatoid factor, left hand: Secondary | ICD-10-CM

## 2018-11-09 DIAGNOSIS — M06012 Rheumatoid arthritis without rheumatoid factor, left shoulder: Secondary | ICD-10-CM

## 2018-11-09 DIAGNOSIS — R29890 Loss of height: Secondary | ICD-10-CM

## 2018-11-09 DIAGNOSIS — M06011 Rheumatoid arthritis without rheumatoid factor, right shoulder: Secondary | ICD-10-CM

## 2018-11-09 DIAGNOSIS — M3500 Sicca syndrome, unspecified: Secondary | ICD-10-CM

## 2018-11-12 ENCOUNTER — Other Ambulatory Visit: Payer: Self-pay | Admitting: Family Medicine

## 2018-11-12 NOTE — Telephone Encounter (Signed)
Not my patient. Pt of Dr. Lorelei Pont.

## 2018-11-15 ENCOUNTER — Other Ambulatory Visit: Payer: Self-pay

## 2018-11-15 DIAGNOSIS — Z79899 Other long term (current) drug therapy: Secondary | ICD-10-CM

## 2018-11-16 LAB — CBC WITH DIFFERENTIAL/PLATELET
Absolute Monocytes: 504 cells/uL (ref 200–950)
Basophils Absolute: 38 cells/uL (ref 0–200)
Basophils Relative: 0.9 %
Eosinophils Absolute: 59 cells/uL (ref 15–500)
Eosinophils Relative: 1.4 %
HCT: 33.6 % — ABNORMAL LOW (ref 35.0–45.0)
Hemoglobin: 11.1 g/dL — ABNORMAL LOW (ref 11.7–15.5)
Lymphs Abs: 1042 cells/uL (ref 850–3900)
MCH: 30.4 pg (ref 27.0–33.0)
MCHC: 33 g/dL (ref 32.0–36.0)
MCV: 92.1 fL (ref 80.0–100.0)
MPV: 10.8 fL (ref 7.5–12.5)
Monocytes Relative: 12 %
Neutro Abs: 2558 cells/uL (ref 1500–7800)
Neutrophils Relative %: 60.9 %
Platelets: 188 10*3/uL (ref 140–400)
RBC: 3.65 10*6/uL — ABNORMAL LOW (ref 3.80–5.10)
RDW: 12.2 % (ref 11.0–15.0)
Total Lymphocyte: 24.8 %
WBC: 4.2 10*3/uL (ref 3.8–10.8)

## 2018-11-16 LAB — COMPLETE METABOLIC PANEL WITH GFR
AG Ratio: 1.7 (calc) (ref 1.0–2.5)
ALT: 16 U/L (ref 6–29)
AST: 29 U/L (ref 10–35)
Albumin: 4.5 g/dL (ref 3.6–5.1)
Alkaline phosphatase (APISO): 51 U/L (ref 37–153)
BUN/Creatinine Ratio: 29 (calc) — ABNORMAL HIGH (ref 6–22)
BUN: 35 mg/dL — ABNORMAL HIGH (ref 7–25)
CO2: 25 mmol/L (ref 20–32)
Calcium: 9.3 mg/dL (ref 8.6–10.4)
Chloride: 100 mmol/L (ref 98–110)
Creat: 1.21 mg/dL — ABNORMAL HIGH (ref 0.60–0.88)
GFR, Est African American: 47 mL/min/{1.73_m2} — ABNORMAL LOW (ref >=60)
GFR, Est Non African American: 41 mL/min/{1.73_m2} — ABNORMAL LOW (ref >=60)
Globulin: 2.6 g/dL (calc) (ref 1.9–3.7)
Glucose, Bld: 101 mg/dL — ABNORMAL HIGH (ref 65–99)
Potassium: 5.2 mmol/L (ref 3.5–5.3)
Sodium: 134 mmol/L — ABNORMAL LOW (ref 135–146)
Total Bilirubin: 0.3 mg/dL (ref 0.2–1.2)
Total Protein: 7.1 g/dL (ref 6.1–8.1)

## 2018-11-16 NOTE — Progress Notes (Signed)
Labs are stable.  Creatinine is mildly elevated most likely due to use of Lasix.  Please forward labs to her PCP.

## 2018-12-12 ENCOUNTER — Encounter: Payer: Self-pay | Admitting: Family Medicine

## 2018-12-13 ENCOUNTER — Encounter: Payer: Self-pay | Admitting: Family Medicine

## 2018-12-13 ENCOUNTER — Other Ambulatory Visit: Payer: Self-pay

## 2018-12-13 ENCOUNTER — Ambulatory Visit (INDEPENDENT_AMBULATORY_CARE_PROVIDER_SITE_OTHER): Payer: Medicare Other | Admitting: Family Medicine

## 2018-12-13 VITALS — BP 126/70 | HR 86 | Temp 96.3°F | Resp 17 | Ht 60.0 in | Wt 106.0 lb

## 2018-12-13 DIAGNOSIS — Z1231 Encounter for screening mammogram for malignant neoplasm of breast: Secondary | ICD-10-CM | POA: Diagnosis not present

## 2018-12-13 DIAGNOSIS — R300 Dysuria: Secondary | ICD-10-CM

## 2018-12-13 LAB — POCT URINALYSIS DIP (MANUAL ENTRY)
Bilirubin, UA: NEGATIVE
Glucose, UA: NEGATIVE mg/dL
Nitrite, UA: NEGATIVE
Protein Ur, POC: 100 mg/dL — AB
Spec Grav, UA: 1.02 (ref 1.010–1.025)
Urobilinogen, UA: 0.2 E.U./dL
pH, UA: 5.5 (ref 5.0–8.0)

## 2018-12-13 MED ORDER — CEPHALEXIN 500 MG PO CAPS: 500.0000 mg | ORAL_CAPSULE | Freq: Two times a day (BID) | ORAL | 0 refills | Status: DC

## 2018-12-13 NOTE — Progress Notes (Addendum)
[facility] at [employer] [place], [place],  [address] [phone]  Date:  12/13/2018   Name:  [name]   DOB:  May 17, 1930   MRN:  [id number]  PCP:  Darreld Mclean, MD    Chief Complaint: Urinary Tract Infection (frequency, urgency, burning)   History of Present Illness:  [name] is a 85 y.o. very pleasant female patient who presents with the following:  Visit today for possible UTI- history of HTN, lung cancer right hip replacment complicated by dislocation, revision and large femoal pseudoaneurysm which also required a repair  No recent UTI noted on chart -per patient recollection it has been several years  She felt a little warm on Saturday- today is Monday No fever Her DBP was high and she got concerned The next am she noted dysuria and urgency  This seems like a UTI to her-symptoms are similar to past UTI No hematuria No new back pain, no belly pain She is eating ok She did eat some fast food recently and it was too salty for her   She is due for mammogram screening  Patient Active Problem List   Diagnosis Date Noted  . Popliteal artery occlusion, right ([place]) 04/10/2018  . HTN (hypertension) 04/10/2018  . Femoral artery pseudo-aneurysm, right ([place]) 04/09/2018  . Anemia of chronic disease 02/24/2018  . Unstable right hip arthroplasty 01/24/2018  . History of revision of total replacement of right hip joint 01/24/2018  . Hypovolemic shock ([place])   . Hyperkalemia   . Hyponatremia   . Failed total hip arthroplasty ([place]) 11/27/2017  . Status post revision of total hip 11/27/2017  . Elevated cholesterol 10/11/2015  . 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[place]) 10/04/2014  . Bilateral leg edema 08/01/2014  . Elevated BP 08/01/2014  . Rheumatoid arthritis involving multiple joints ([place]) 05/30/2014  . Osteoarthritis of right hip 04/28/2014  . Status post total replacement of right hip 04/28/2014  .  Long-term use of high-risk medication 11/11/2013  . Routine general medical examination at a health care facility 11/11/2013  . Symptomatic anemia 11/11/2013  . Neuropathic pain 07/07/2013  . Constipation due to pain medication 07/07/2013  . Protein-calorie malnutrition, severe ([place]) 06/08/2013  . Lung cancer, Right upper lobe 05/08/2013  . Sciatica of right side 08/13/2011  . Osteopenia 03/07/2010  . PARESTHESIA 03/07/2010  . CT, CHEST, ABNORMAL 12/18/2008  . ABNORMAL ECHOCARDIOGRAM 12/14/2008  . [place] SYNDROME 11/29/2008  . HYPOGLYCEMIA 06/29/2006  . RAYNAUD'S DISEASE 06/29/2006    Past Medical History:  Diagnosis Date  . Arthralgia of multiple joints    followed by dr Gerilyn Nestle  . Arthritis   . Cardiomyopathy ([place])   . Chronic constipation   . Chronic inflammatory arthritis    rhemotolgist-  dr a. Gerilyn Nestle [facility])  . Dry eyes    eye drops used   . GERD (gastroesophageal reflux disease)   . H/O discoid lupus erythematosus   . Hiatal hernia   . History of colon polyps   . Hypothyroidism   . Iron deficiency anemia   . LBBB (left bundle branch block) 2010  . Mitchell's disease (erythromelalgia) [facility])    neurologist-  dr patel  . Nocturia   . Non-small cell cancer of right lung [facility]) surgeon-- dr gerhardt/  oncologist-  dr Julien Nordmann--- per lov notes no recurrence/   11-18-2017 per pt denies any symptoms   dx 2015--  Stage IIA (T2b,N0,M0) , +EGFR  mutation in exon 21, non-small cell adenocarcinoma right upper lobe---  s/p  Right upper lobectomy , right middley wedge resection and node dissection---  no chemo or radiation therapy  . OA (osteoarthritis)    hands  . Osteoporosis   . PONV (postoperative nausea and vomiting)    likes phenergan  . Raynaud's phenomenon 1965  . Renal insufficiency   . Rheumatoid arthritis ([place])   . Sciatica   . Scoliosis   . Sjogren's syndrome [facility])     Past Surgical History:  Procedure Laterality Date  . ANTERIOR HIP  REVISION Right 11/27/2017   Procedure: RIGHT HIP ACETABULAR REVISION;  Surgeon: [name], MD;  Location: [facility];  Service: Orthopedics;  Laterality: Right;  . ANTERIOR HIP REVISION Right 01/24/2018   Procedure: OPEN REDUCTION OF DISLOCATED ANTERIOR HIP WITH REVISION OF LINER AND HIP BALL;  Surgeon: [name], MD;  Location: [facility];  Service: Orthopedics;  Laterality: Right;  . APPENDECTOMY  1950s  . BIOPSY  04/14/2018   Procedure: BIOPSY;  Surgeon: Yetta Flock, MD;  Location: [facility];  Service: Gastroenterology;;  . BIOPSY  04/16/2018   Procedure: BIOPSY;  Surgeon: Irving Copas., MD;  Location: [facility];  Service: Gastroenterology;;  . CARDIOVASCULAR STRESS TEST  12/2008    mild fixed basal to mid septal perfusion defect felt likely due to artifact from LBBB, no ischemia, EF 58%  . COLONOSCOPY    . COLONOSCOPY WITH PROPOFOL N/A 04/16/2018   Procedure: COLONOSCOPY WITH PROPOFOL;  Surgeon: Rush Landmark Telford Nab., MD;  Location: [place];  Service: Gastroenterology;  Laterality: N/A;  . ESOPHAGOGASTRODUODENOSCOPY (EGD) WITH PROPOFOL N/A 04/14/2018   Procedure: ESOPHAGOGASTRODUODENOSCOPY (EGD) WITH PROPOFOL;  Surgeon: Yetta Flock, MD;  Location: [place];  Service: Gastroenterology;  Laterality: N/A;  . FEMORAL-POPLITEAL BYPASS GRAFT Right 04/10/2018   Procedure: REPAIR RIGHT FEMORAL ARTERY PSEUDOANEURYSM, RETROPERITONEAL EXPOSURE OF ILIAC ARTERY, RIGHT POPLITEAL EMBOLECTOMY;  Surgeon: Angelia Mould, MD;  Location: [place];  Service: Vascular;  Laterality: Right;  . LYMPH NODE DISSECTION Right 06/07/2013   Procedure: LYMPH NODE DISSECTION;  Surgeon: [name], MD;  Location: [place];  Service: Thoracic;  Laterality: Right;  . PATCH ANGIOPLASTY Right 04/10/2018   Procedure: PATCH  ANGIOPLASTY OF RIGHT FEMORAL ARTERY USING BOVINE PATCH, PATCH ANGIOPLASTY OF RIGHT POPLITEAL ARTERY USING BOVINE PATCH;  Surgeon: Angelia Mould, MD;  Location: [place];  Service: Vascular;  Laterality: Right;  . [place]   "large incision from chest to up to shoulder, the nerves were tied together, for raynaud's  . THORACOTOMY  06/07/2013   Procedure: MINI/LIMITED THORACOTOMY; right middle lobe wedge resection;  Surgeon: [name], MD;  Location: [place];  Service: Thoracic;;  . TONSILLECTOMY  child  . TOTAL ABDOMINAL HYSTERECTOMY  1980's    W/ BSO  . TOTAL HIP ARTHROPLASTY Right 04/28/2014   Procedure: RIGHT TOTAL HIP ARTHROPLASTY ANTERIOR APPROACH;  Surgeon: [name], MD;  [facility];  Service: Orthopedics;  Laterality: Right;  . TRANSTHORACIC ECHOCARDIOGRAM  12/11/2008   ef 18-84%, grade 1 diastolic dysfunction/  mild LAE/  mild AR and MR/  trivial TR  . VIDEO ASSISTED THORACOSCOPY (VATS)/WEDGE RESECTION Right 06/07/2013   Procedure: VIDEO ASSISTED THORACOSCOPY (VATS)/right upper lobectomy, On Q;  Surgeon: [name], MD;  Location: [place];  Service: Thoracic;  Laterality: Right;  [place] VIDEO BRONCHOSCOPY N/A 06/07/2013   Procedure: VIDEO BRONCHOSCOPY;  Surgeon: [name], MD;  [facility];  Service: Thoracic;  Laterality: N/A;  . VIDEO BRONCHOSCOPY WITH ENDOBRONCHIAL NAVIGATION N/A 05/04/2013   Procedure: VIDEO BRONCHOSCOPY WITH ENDOBRONCHIAL NAVIGATION;  Surgeon: [name], MD;  Location: [place];  Service: Thoracic;  Laterality: N/A;    Social History   Tobacco Use  . Smoking status: Never Smoker  . Smokeless tobacco: Never Used  Substance Use Topics  . Alcohol use: Not Currently    Frequency: Never  . Drug use: Never    Family History  Problem Relation Age of Onset  . Coronary artery disease Father   . Colon cancer Father   . Diabetes Father   . Cancer Father        colon  . Other Mother 37       MVA  . Healthy Sister   . Healthy Brother   . Healthy Daughter   . Hypothyroidism Daughter   . Other Brother        killed in war  .  Pneumonia Sister   . Healthy Daughter   . Esophageal cancer Neg Hx   . Kidney disease Neg Hx   . Liver disease Neg Hx     Allergies  Allergen Reactions  . Amlodipine Rash  . Prochlorperazine Edisylate Anaphylaxis    Compazine--- tongue swells and rash  . Aspirin Other (See Comments)    nose bleeds. Cannot take NSAIDS   . Cymbalta [Duloxetine Hcl] Diarrhea, Nausea And Vomiting and Other (See Comments)    Increased blood pressure  . Pamelor [Nortriptyline Hcl] Diarrhea and Nausea Only    Increased Heart rate and BP    Medication list has been reviewed and updated.  Current Outpatient Medications on File Prior to Visit  Medication Sig Dispense Refill  . Acetaminophen (TYLENOL EXTRA STRENGTH PO) Take 2 tablets by mouth every 6 (six) hours as needed.    . Biotin 1000 MCG tablet Take 1,000 mcg by mouth daily.     . Calcium Carbonate-Vitamin D (CALCIUM 500 + D) 500-125 MG-UNIT TABS Take 1 tablet by mouth daily.     . diclofenac sodium (VOLTAREN) 1 % GEL Apply topically as needed.    . Diclofenac Sodium 3 % GEL Apply a pea sized amount to affected joint twice daily as needed. 100 g 2  . docusate sodium (STOOL SOFTENER) 100 MG capsule Take 100 mg by mouth 2 (two) times daily.      . furosemide (LASIX) 20 MG tablet Take 1 tablet (20 mg total) by mouth daily.    Marland Kitchen gabapentin (NEURONTIN) 300 MG capsule TAKE '300MG'$  IN THE MORNING, '300MG'$  IN THE AFTERNOON, AND '600MG'$  AT BEDTIME. (Patient taking differently: Take 300-600 mg by mouth See admin instructions. Take '300mg'$  in the morning, '300mg'$  in the afternoon, and '600mg'$  at bedtime.) 120 capsule 5  . Glucosamine-Chondroit-Vit C-Mn (GLUCOSAMINE CHONDR 1500 COMPLX PO) Take 1 capsule by mouth daily.     Marland Kitchen HYDROcodone-acetaminophen (NORCO/VICODIN) 5-325 MG tablet Take 1 tablet by mouth every 4 (four) hours as needed for severe pain. 40 tablet 0  . leflunomide (ARAVA) 10 MG tablet Take 1 tablet (10 mg total) by mouth daily. 30 tablet 2  . levothyroxine  (SYNTHROID) 88 MCG tablet TAKE 1 TABLET (88 MCG TOTAL) BY MOUTH DAILY BEFORE BREAKFAST. 90 tablet 1  . methocarbamol (ROBAXIN) 500 MG tablet TAKE 1 TABLET (500 MG TOTAL) BY MOUTH EVERY 6 (SIX) HOURS AS NEEDED FOR MUSCLE SPASMS. 40 tablet 0  . Multiple Vitamin (MULTIVITAMIN) tablet Take 1 tablet by mouth  daily.      . omeprazole (PRILOSEC) 20 MG capsule TAKE 1 CAPSULE BY MOUTH 2 TIMES DAILY BEFORE A MEAL. 180 capsule 1  . ondansetron (ZOFRAN ODT) 4 MG disintegrating tablet Take 1 tablet (4 mg total) by mouth every 6 (six) hours as needed for nausea or vomiting. 30 tablet 0  . Probiotic Product (PROBIOTIC PO) Take by mouth daily.    Marland Kitchen senna-docusate (SENOKOT-S) 8.6-50 MG tablet Take 1 tablet by mouth at bedtime as needed for mild constipation. 30 tablet 0  . spironolactone (ALDACTONE) 25 MG tablet Take 12.5 mg by mouth every morning.   2  . metoprolol succinate (TOPROL XL) 25 MG 24 hr tablet Take 0.5 tablets (12.5 mg total) by mouth daily. (Patient not taking: Reported on 12/13/2018) 45 tablet 3   No current facility-administered medications on file prior to visit.     Review of Systems:  As per HPI- otherwise negative. Recent renal function GFR 41 She needs a mammogram soon- will order for her   Physical Examination: Vitals:   12/13/18 1115  BP: 126/70  Pulse: 86  Resp: 17  Temp: (!) 96.3 F (35.7 C)  SpO2: 98%   Vitals:   12/13/18 1115  Weight: 106 lb (48.1 kg)  Height: 5' (1.524 m)   Body mass index is 20.7 kg/m. Ideal Body Weight: Weight in (lb) to have BMI = 25: 127.7  GEN: WDWN, NAD, Non-toxic, A & O x 3, woman with significant scoliosis, looks well HEENT: Atraumatic, Normocephalic. Neck supple. No masses, No LAD. Ears and Nose: No external deformity. CV: RRR, No M/G/R. No JVD. No thrill. No extra heart sounds. PULM: CTA B, no wheezes, crackles, rhonchi. No retractions. No resp. distress. No accessory muscle use. ABD: S, NT, ND. No rebound. No HSM.  No CVA  tenderness EXTR: No c/c/e NEURO Normal gait for patient, she does use a walker PSYCH: Normally interactive. Conversant. Not depressed or anxious appearing.  Calm demeanor.  As usual [name] is completely sharp, very spry and independent for her age  NOT able to add micro due to low volume of urine  Assessment and Plan: Dysuria - Plan: cephALEXin (KEFLEX) 500 MG capsule, Urine Culture, POCT urinalysis dipstick  Encounter for screening mammogram for malignant neoplasm of breast - Plan: MM 3D SCREEN BREAST BILATERAL  Here today with concern of UTI.  UA is suspicious.  Send urine for culture, will start on Keflex 500 twice daily We will be in touch with culture results, asked patient to alert me if not feeling better in the next 1 to 2 days, sooner if worse Order mammogram  Signed Lamar Blinks, MD  Received her urine culture 10/28 Message to pt abx should be effective for her   Results for orders placed or performed in visit on 12/13/18  Urine Culture   Specimen: Urine  Result Value Ref Range   MICRO NUMBER: [id number]    SPECIMEN QUALITY: Adequate    Sample Source NOT GIVEN    STATUS: FINAL    ISOLATE 1: Escherichia coli (A)       Susceptibility   Escherichia coli - URINE CULTURE, REFLEX    AMOX/CLAVULANIC <=2 Sensitive     AMPICILLIN 4 Sensitive     AMPICILLIN/SULBACTAM <=2 Sensitive     CEFAZOLIN* <=4 Not Reportable      * For infections other than uncomplicated UTIcaused by E. coli, K. pneumoniae or P. mirabilis:Cefazolin is resistant if MIC > or = 8 mcg/mL.(Distinguishing susceptible versus intermediatefor isolates  with MIC < or = 4 mcg/mL requiresadditional testing.)For uncomplicated UTI caused by E. coli,K. pneumoniae or P. mirabilis: Cefazolin issusceptible if MIC <32 mcg/mL and predictssusceptible to the oral agents cefaclor, cefdinir,cefpodoxime, cefprozil, cefuroxime, cephalexinand loracarbef.    CEFEPIME <=1 Sensitive     CEFTRIAXONE <=1 Sensitive     CIPROFLOXACIN  <=0.25 Sensitive     LEVOFLOXACIN <=0.12 Sensitive     ERTAPENEM <=0.5 Sensitive     GENTAMICIN <=1 Sensitive     IMIPENEM <=0.25 Sensitive     NITROFURANTOIN <=16 Sensitive     PIP/TAZO <=4 Sensitive     TOBRAMYCIN <=1 Sensitive     TRIMETH/SULFA* <=20 Sensitive      * For infections other than uncomplicated UTIcaused by E. coli, K. pneumoniae or P. mirabilis:Cefazolin is resistant if MIC > or = 8 mcg/mL.(Distinguishing susceptible versus intermediatefor isolates with MIC < or = 4 mcg/mL requiresadditional testing.)For uncomplicated UTI caused by E. coli,K. pneumoniae or P. mirabilis: Cefazolin issusceptible if MIC <32 mcg/mL and predictssusceptible to the oral agents cefaclor, cefdinir,cefpodoxime, cefprozil, cefuroxime, cephalexinand loracarbef.Legend:S = Susceptible  I = IntermediateR = Resistant  NS = Not susceptible* = Not tested  NR = Not reported**NN = See antimicrobic comments  POCT urinalysis dipstick  Result Value Ref Range   Color, UA yellow yellow   Clarity, UA cloudy (A) clear   Glucose, UA negative negative mg/dL   Bilirubin, UA negative negative   Ketones, POC UA trace (5) (A) negative mg/dL   Spec Grav, UA 1.020 1.010 - 1.025   Blood, UA large (A) negative   pH, UA 5.5 5.0 - 8.0   Protein Ur, POC =100 (A) negative mg/dL   Urobilinogen, UA 0.2 0.2 or 1.0 E.U./dL   Nitrite, UA Negative Negative   Leukocytes, UA Large (3+) (A) Negative

## 2018-12-13 NOTE — Patient Instructions (Addendum)
It was good to see you again today- I will be in touch with your urine culture asap For the time being start treatment with keflex twice a day for one week Please let me know if you are not feeling better in the next 1-2 days- Sooner if worse.

## 2018-12-14 NOTE — Progress Notes (Signed)
Office Visit Note  Patient: [name]             Date of Birth: 02-22-1930           MRN: [id number]             PCP: Darreld Mclean, MD Referring: Darreld Mclean, MD Visit Date: 12/28/2018 Occupation: '@GUAROCC'$ @  Subjective:  Pain in shoulder joints   History of Present Illness: [name] is a 85 y.o. female with severe rheumatoid arthritis.  She has done really well on [place].  She states this joint swelling and discomfort is improved.  Although she continues to have some discomfort in her shoulder joints.  She states she has tried cortisone injections in the past which did not work.  She states her pain has been managed by tramadol and gabapentin.  She states she had recent urinary tract infection which did not respond to the antibiotics and she is on a course of amoxicillin.  She did not to stop Arava during the infection.  She continues to have some sicca symptoms.  Activities of Daily Living:  Patient reports morning stiffness for 5 hours.   Patient Denies nocturnal pain.  Difficulty dressing/grooming: Reports Difficulty climbing stairs: Reports Difficulty getting out of chair: Reports Difficulty using hands for taps, buttons, cutlery, and/or writing: Reports  Review of Systems  Constitutional: Negative for fatigue, night sweats, weight gain and weight loss.  HENT: Negative for mouth sores, trouble swallowing, trouble swallowing, mouth dryness and nose dryness.   Eyes: Positive for dryness. Negative for pain, redness and visual disturbance.  Respiratory: Negative for cough, shortness of breath and difficulty breathing.   Cardiovascular: Negative for chest pain, palpitations, hypertension, irregular heartbeat and swelling in legs/feet.  Gastrointestinal: Negative for blood in stool, constipation and diarrhea.  Endocrine: Positive for increased urination. Negative for cold intolerance.  Genitourinary: Positive for difficulty urinating and painful urination.  Negative for vaginal dryness.  Musculoskeletal: Positive for arthralgias, gait problem, joint pain, morning stiffness and muscle tenderness. Negative for joint swelling, myalgias, muscle weakness and myalgias.  Skin: Negative for color change, rash, hair loss, skin tightness, ulcers and sensitivity to sunlight.  Allergic/Immunologic: Positive for susceptible to infections.  Neurological: Negative for dizziness, numbness, memory loss, night sweats and weakness.  Hematological: Negative for bruising/bleeding tendency and swollen glands.  Psychiatric/Behavioral: Negative for depressed mood and sleep disturbance. The patient is not nervous/anxious.     PMFS History:  Patient Active Problem List   Diagnosis Date Noted  . Popliteal artery occlusion, right ([place]) 04/10/2018  . HTN (hypertension) 04/10/2018  . Femoral artery pseudo-aneurysm, right ([place]) 04/09/2018  . Anemia of chronic disease 02/24/2018  . Unstable right hip arthroplasty 01/24/2018  . History of revision of total replacement of right hip joint 01/24/2018  . Hypovolemic shock ([place])   . Hyperkalemia   . Hyponatremia   . Failed total hip arthroplasty ([place]) 11/27/2017  . Status post revision of total hip 11/27/2017  . Elevated cholesterol 10/11/2015  . 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[place]) 10/04/2014  . Bilateral leg edema 08/01/2014  . Elevated BP 08/01/2014  . Rheumatoid arthritis involving multiple joints ([place]) 05/30/2014  . Osteoarthritis of right hip 04/28/2014  . Status post total replacement of right hip 04/28/2014  . Long-term use of high-risk medication 11/11/2013  . Routine general medical examination at a health care facility 11/11/2013  . Symptomatic anemia 11/11/2013  . Neuropathic pain 07/07/2013  . Constipation due to pain medication 07/07/2013  .  Protein-calorie malnutrition, severe ([place]) 06/08/2013  . Lung cancer, Right upper lobe 05/08/2013  . Sciatica of right side 08/13/2011  . Osteopenia 03/07/2010  .  PARESTHESIA 03/07/2010  . CT, CHEST, ABNORMAL 12/18/2008  . ABNORMAL ECHOCARDIOGRAM 12/14/2008  . [place] SYNDROME 11/29/2008  . HYPOGLYCEMIA 06/29/2006  . RAYNAUD'S DISEASE 06/29/2006    Past Medical History:  Diagnosis Date  . Arthralgia of multiple joints    followed by dr Gerilyn Nestle  . Arthritis   . Cardiomyopathy ([place])   . Chronic constipation   . Chronic inflammatory arthritis    rhemotolgist-  dr a. Gerilyn Nestle [facility])  . Dry eyes    eye drops used   . GERD (gastroesophageal reflux disease)   . H/O discoid lupus erythematosus   . Hiatal hernia   . History of colon polyps   . Hypothyroidism   . Iron deficiency anemia   . LBBB (left bundle branch block) 2010  . Mitchell's disease (erythromelalgia) [facility])    neurologist-  dr patel  . Nocturia   . Non-small cell cancer of right lung [facility]) surgeon-- dr gerhardt/  oncologist-  dr Julien Nordmann--- per lov notes no recurrence/   11-18-2017 per pt denies any symptoms   dx 2015--  Stage IIA (T2b,N0,M0) , +EGFR  mutation in exon 21, non-small cell adenocarcinoma right upper lobe---  s/p  Right upper lobectomy , right middley wedge resection and node dissection---  no chemo or radiation therapy  . OA (osteoarthritis)    hands  . Osteoporosis   . PONV (postoperative nausea and vomiting)    likes phenergan  . Raynaud's phenomenon 1965  . Renal insufficiency   . Rheumatoid arthritis ([place])   . Sciatica   . Scoliosis   . Sjogren's syndrome ([place])     Family History  Problem Relation Age of Onset  . Coronary artery disease Father   . Colon cancer Father   . Diabetes Father   . Cancer Father        colon  . Other Mother 36       MVA  . Healthy Sister   . Healthy Brother   . Healthy Daughter   . Hypothyroidism Daughter   . Other Brother        killed in war  . Pneumonia Sister   . Healthy Daughter   . Esophageal cancer Neg Hx   . Kidney disease Neg Hx   . Liver disease Neg Hx    Past Surgical History:  Procedure  Laterality Date  . ANTERIOR HIP REVISION Right 11/27/2017   Procedure: RIGHT HIP ACETABULAR REVISION;  Surgeon: [name], MD;  Location: [facility];  Service: Orthopedics;  Laterality: Right;  . ANTERIOR HIP REVISION Right 01/24/2018   Procedure: OPEN REDUCTION OF DISLOCATED ANTERIOR HIP WITH REVISION OF LINER AND HIP BALL;  Surgeon: [name], MD;  Location: [facility];  Service: Orthopedics;  Laterality: Right;  . APPENDECTOMY  1950s  . BIOPSY  04/14/2018   Procedure: BIOPSY;  Surgeon: Yetta Flock, MD;  Location: [place];  Service: Gastroenterology;;  . BIOPSY  04/16/2018   Procedure: BIOPSY;  Surgeon: Irving Copas., MD;  Location: [facility];  Service: Gastroenterology;;  . CARDIOVASCULAR STRESS TEST  12/2008    mild fixed basal to mid septal perfusion defect felt likely due to artifact from LBBB, no ischemia, EF 58%  . COLONOSCOPY    . COLONOSCOPY WITH PROPOFOL N/A 04/16/2018   Procedure: COLONOSCOPY WITH PROPOFOL;  Surgeon: Justice Britain  Brooke Bonito., MD;  Location: [place];  Service: Gastroenterology;  Laterality: N/A;  . ESOPHAGOGASTRODUODENOSCOPY (EGD) WITH PROPOFOL N/A 04/14/2018   Procedure: ESOPHAGOGASTRODUODENOSCOPY (EGD) WITH PROPOFOL;  Surgeon: Yetta Flock, MD;  Location: [place];  Service: Gastroenterology;  Laterality: N/A;  . FEMORAL-POPLITEAL BYPASS GRAFT Right 04/10/2018   Procedure: REPAIR RIGHT FEMORAL ARTERY PSEUDOANEURYSM, RETROPERITONEAL EXPOSURE OF ILIAC ARTERY, RIGHT POPLITEAL EMBOLECTOMY;  Surgeon: Angelia Mould, MD;  Location: [place];  Service: Vascular;  Laterality: Right;  . LYMPH NODE DISSECTION Right 06/07/2013   Procedure: LYMPH NODE DISSECTION;  Surgeon: [name], MD;  Location: [place];  Service: Thoracic;  Laterality: Right;  . PATCH ANGIOPLASTY Right 04/10/2018   Procedure: PATCH  ANGIOPLASTY OF RIGHT FEMORAL ARTERY USING BOVINE PATCH, PATCH ANGIOPLASTY OF RIGHT POPLITEAL ARTERY USING  BOVINE PATCH;  Surgeon: Angelia Mould, MD;  Location: [place];  Service: Vascular;  Laterality: Right;  . [place]   "large incision from chest to up to shoulder, the nerves were tied together, for raynaud's  . THORACOTOMY  06/07/2013   Procedure: MINI/LIMITED THORACOTOMY; right middle lobe wedge resection;  Surgeon: [name], MD;  Location: [place];  Service: Thoracic;;  . TONSILLECTOMY  child  . TOTAL ABDOMINAL HYSTERECTOMY  1980's    W/ BSO  . TOTAL HIP ARTHROPLASTY Right 04/28/2014   Procedure: RIGHT TOTAL HIP ARTHROPLASTY ANTERIOR APPROACH;  Surgeon: [name], MD;  [facility];  Service: Orthopedics;  Laterality: Right;  . TRANSTHORACIC ECHOCARDIOGRAM  12/11/2008   ef 20-25%, grade 1 diastolic dysfunction/  mild LAE/  mild AR and MR/  trivial TR  . VIDEO ASSISTED THORACOSCOPY (VATS)/WEDGE RESECTION Right 06/07/2013   Procedure: VIDEO ASSISTED THORACOSCOPY (VATS)/right upper lobectomy, On Q;  Surgeon: [name], MD;  Location: [name];  Service: Thoracic;  Laterality: Right;  [place] VIDEO BRONCHOSCOPY N/A 06/07/2013   Procedure: VIDEO BRONCHOSCOPY;  Surgeon: [name], MD;  [facility] OR;  Service: Thoracic;  Laterality: N/A;  . VIDEO BRONCHOSCOPY WITH ENDOBRONCHIAL NAVIGATION N/A 05/04/2013   Procedure: VIDEO BRONCHOSCOPY WITH ENDOBRONCHIAL NAVIGATION;  Surgeon: [name], MD;  Location: [place];  Service: Thoracic;  Laterality: N/A;   Social History   Social History Narrative   Lives with husband, daughter and grandchild local.   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 Fluad Quad(high Dose 65+) 11/03/2018  . Influenza Split 11/21/2011  . Influenza Whole 11/29/2007, 11/29/2008, 11/29/2009  . Influenza, High Dose Seasonal PF 10/30/2012, 01/02/2015  . Influenza,inj,Quad PF,6+ Mos 11/22/2013, 11/14/2015  .  Influenza-Unspecified 11/13/2016, 11/12/2017  . Pneumococcal Conjugate-13 05/22/2015  . Pneumococcal Polysaccharide-23 06/13/2013  . Tdap 08/17/2017  . Zoster 01/27/2014     Objective: Vital Signs: BP 128/72 (BP Location: Left Arm, Patient Position: Sitting, Cuff Size: Normal)   Pulse 78   Resp 16   Ht 5' (1.524 m)   Wt 108 lb 6.4 oz (49.2 kg)   BMI 21.17 kg/m    Physical Exam Vitals signs and nursing note reviewed.  Constitutional:      Appearance: She is well-developed.  HENT:     Head: Normocephalic and atraumatic.  Eyes:     Conjunctiva/sclera: Conjunctivae normal.  Neck:     Musculoskeletal: Normal range of motion.  Cardiovascular:     Rate and Rhythm: Normal rate and regular rhythm.     Heart sounds: Normal heart sounds.  Pulmonary:  Effort: Pulmonary effort is normal.     Breath sounds: Normal breath sounds.  Abdominal:     General: Bowel sounds are normal.     Palpations: Abdomen is soft.  Lymphadenopathy:     Cervical: No cervical adenopathy.  Skin:    General: Skin is warm and dry.     Capillary Refill: Capillary refill takes less than 2 seconds.  Neurological:     Mental Status: She is alert and oriented to person, place, and time.  Psychiatric:        Behavior: Behavior normal.      Musculoskeletal Exam: He has limited range of motion of her cervical spine.  She has severe thoracolumbar scoliosis.  She has limited range of motion of her bilateral shoulder joints with painful abduction.  She had good range of motion of her elbow joints.  She has PIP and DIP thickening.  She has bilateral MCP joint thickening without any active synovitis today.  Her right hip joint is replaced and had fairly good range of motion.  Knee joints and ankle joints are in good range of motion.  No synovitis noted over MTP joints.  CDAI Exam: CDAI Score: 1.2  Patient Global: 8 mm; Provider Global: 4 mm Swollen: 0 ; Tender: 0  Joint Exam   No joint exam has been documented  for this visit   There is currently no information documented on the homunculus. Go to the Rheumatology activity and complete the homunculus joint exam.  Investigation: No additional findings.  Imaging: No results found.  Recent Labs: Lab Results  Component Value Date   WBC 4.2 11/15/2018   HGB 11.1 (L) 11/15/2018   PLT 188 11/15/2018   NA 134 (L) 11/15/2018   K 5.2 11/15/2018   CL 100 11/15/2018   CO2 25 11/15/2018   GLUCOSE 101 (H) 11/15/2018   BUN 35 (H) 11/15/2018   CREATININE 1.21 (H) 11/15/2018   BILITOT 0.3 11/15/2018   ALKPHOS 57 04/17/2018   AST 29 11/15/2018   ALT 16 11/15/2018   PROT 7.1 11/15/2018   ALBUMIN 2.4 (L) 04/17/2018   CALCIUM 9.3 11/15/2018   GFRAA 47 (L) 11/15/2018    Speciality Comments: No specialty comments available.  Procedures:  No procedures performed Allergies: Amlodipine, Prochlorperazine edisylate, Aspirin, Cymbalta [duloxetine hcl], and Pamelor [nortriptyline hcl]   Assessment / Plan:     Visit Diagnoses: Rheumatoid arthritis of multiple sites with negative rheumatoid factor (HCC) - RF negative, anti-CCP negative, treated with Plaquenil for several years which was discontinued due to ocular toxicity.  She had an adequate response to methotrexate.  Patient was placed on [place] about a month ago.  She has been tolerating it well.  She has noticed improvement in her joint symptoms.  She had no active synovitis on examination today.  Although she continues to have pain and stiffness due to severe arthritis.  Rheumatoid arthritis involving both hands with negative rheumatoid factor ([place]) - she has severe end-stage rheumatoid arthritis.  No synovitis was noted.  High risk medication use - Arava 10 mg daily started on 10/30/2018. - Plan: CBC, CMP today and then every 3 months to monitor for drug toxicity.  UTI-patient gives history of urinary tract infection.  She states she did not respond to the antibiotics and is on second course of  antibiotics currently.  I have advised her to hold off [place] until her UTI is treated.  I also had detailed discussion regarding stopping Arava for every infection and then restarting  after the infection is resolved.  Rheumatoid arthritis involving both shoulders with negative rheumatoid factor (HCC)-she has limited range of motion of her shoulders and not of chronic discomfort.  Patient reports an adequate response to cortisone injections in the past.  Trigger finger, right middle finger-doing better.  Trigger finger, right ring finger-improved.  Sicca syndrome (HCC)-over-the-counter products were discussed.  Osteopenia, unspecified location - She is on a calcium and vitamin D supplement daily. Last DEXA on file from 11/05/2011 ordered by Dr. Birdie Riddle showed T score of -2.4 at left femur neck with -4.2% change in BMD compared to prior DEXA in November 2009.  She needs a repeat bone density.  She also had some height loss.  Patient wants to get bone density through her PCP.  Height loss-I am concerned that height loss is due to severe osteoporosis.  Her last T score was not bad.  I have advised her to get repeat bone density.  Failure of right total hip arthroplasty, subsequent encounter-she has chronic discomfort in her hip.  Other medical problems are listed as follows:  Anemia of chronic disease  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HCC)  Other idiopathic scoliosis, thoracolumbar region  Elevated cholesterol  Sciatica of right side  Rheumatoid arthritis involving multiple joints ([place])  Orders: Orders Placed This Encounter  Procedures  . CBC  . CMP   No orders of the defined types were placed in this encounter.     Follow-Up Instructions: Return in about 3 months (around 03/30/2019) for Rheumatoid arthritis.   Bo Merino, MD  Note - This record has been created using Editor, commissioning.  Chart creation errors have been sought, but may not always  have been located. Such  creation errors do not reflect on  the standard of medical care.

## 2018-12-15 ENCOUNTER — Encounter: Payer: Self-pay | Admitting: Family Medicine

## 2018-12-15 LAB — URINE CULTURE
MICRO NUMBER:: 1029250
SPECIMEN QUALITY:: ADEQUATE

## 2018-12-26 ENCOUNTER — Encounter: Payer: Self-pay | Admitting: Family Medicine

## 2018-12-26 DIAGNOSIS — R300 Dysuria: Secondary | ICD-10-CM

## 2018-12-27 ENCOUNTER — Telehealth: Payer: Self-pay | Admitting: *Deleted

## 2018-12-27 ENCOUNTER — Other Ambulatory Visit: Payer: Self-pay

## 2018-12-27 ENCOUNTER — Other Ambulatory Visit: Payer: Medicare Other

## 2018-12-27 DIAGNOSIS — R300 Dysuria: Secondary | ICD-10-CM

## 2018-12-27 DIAGNOSIS — Z87440 Personal history of urinary (tract) infections: Secondary | ICD-10-CM

## 2018-12-27 DIAGNOSIS — R350 Frequency of micturition: Secondary | ICD-10-CM

## 2018-12-27 MED ORDER — AMOXICILLIN 500 MG PO CAPS: 500.0000 mg | ORAL_CAPSULE | Freq: Two times a day (BID) | ORAL | 0 refills | Status: DC

## 2018-12-27 NOTE — Telephone Encounter (Signed)
Pt came in dropping off urine sample but there are no future orders in Epic. Please place orders if appropriate.

## 2018-12-27 NOTE — Telephone Encounter (Signed)
Thank you. Orders released and sent to lab.

## 2018-12-27 NOTE — Telephone Encounter (Signed)
Orders placed.

## 2018-12-28 ENCOUNTER — Encounter: Payer: Self-pay | Admitting: Family Medicine

## 2018-12-28 ENCOUNTER — Other Ambulatory Visit: Payer: Self-pay

## 2018-12-28 ENCOUNTER — Encounter: Payer: Self-pay | Admitting: Rheumatology

## 2018-12-28 ENCOUNTER — Ambulatory Visit (INDEPENDENT_AMBULATORY_CARE_PROVIDER_SITE_OTHER): Payer: Medicare Other | Admitting: Rheumatology

## 2018-12-28 VITALS — BP 128/72 | HR 78 | Resp 16 | Ht 60.0 in | Wt 108.4 lb

## 2018-12-28 DIAGNOSIS — E270 Other adrenocortical overactivity: Secondary | ICD-10-CM

## 2018-12-28 DIAGNOSIS — M3500 Sicca syndrome, unspecified: Secondary | ICD-10-CM

## 2018-12-28 DIAGNOSIS — Z79899 Other long term (current) drug therapy: Secondary | ICD-10-CM | POA: Diagnosis not present

## 2018-12-28 DIAGNOSIS — M0609 Rheumatoid arthritis without rheumatoid factor, multiple sites: Secondary | ICD-10-CM

## 2018-12-28 DIAGNOSIS — M65341 Trigger finger, right ring finger: Secondary | ICD-10-CM

## 2018-12-28 DIAGNOSIS — R29890 Loss of height: Secondary | ICD-10-CM

## 2018-12-28 DIAGNOSIS — M06011 Rheumatoid arthritis without rheumatoid factor, right shoulder: Secondary | ICD-10-CM

## 2018-12-28 DIAGNOSIS — M4125 Other idiopathic scoliosis, thoracolumbar region: Secondary | ICD-10-CM

## 2018-12-28 DIAGNOSIS — M06012 Rheumatoid arthritis without rheumatoid factor, left shoulder: Secondary | ICD-10-CM

## 2018-12-28 DIAGNOSIS — M06041 Rheumatoid arthritis without rheumatoid factor, right hand: Secondary | ICD-10-CM

## 2018-12-28 DIAGNOSIS — M06042 Rheumatoid arthritis without rheumatoid factor, left hand: Secondary | ICD-10-CM

## 2018-12-28 DIAGNOSIS — T84010D Broken internal right hip prosthesis, subsequent encounter: Secondary | ICD-10-CM

## 2018-12-28 DIAGNOSIS — D638 Anemia in other chronic diseases classified elsewhere: Secondary | ICD-10-CM

## 2018-12-28 DIAGNOSIS — E7800 Pure hypercholesterolemia, unspecified: Secondary | ICD-10-CM

## 2018-12-28 DIAGNOSIS — M069 Rheumatoid arthritis, unspecified: Secondary | ICD-10-CM

## 2018-12-28 DIAGNOSIS — M8580 Other specified disorders of bone density and structure, unspecified site: Secondary | ICD-10-CM

## 2018-12-28 DIAGNOSIS — M5431 Sciatica, right side: Secondary | ICD-10-CM

## 2018-12-28 DIAGNOSIS — M65331 Trigger finger, right middle finger: Secondary | ICD-10-CM

## 2018-12-28 NOTE — Patient Instructions (Signed)
Standing Labs We placed an order today for your standing lab work.    Please come back and get your standing labs in   We have open lab daily Monday through Thursday from 8:30-12:30 PM and 1:30-4:30 PM and Friday from 8:30-12:30 PM and 1:30-4:00 PM at the office of Dr. Bo Merino.   You may experience shorter wait times on Monday and Friday afternoons. The office is located at [address] [place], [place], [place] [address] No appointment is necessary.   Labs are drawn by [facility].  You may receive a bill from [facility] for your lab work.  If you wish to have your labs drawn at another location, please call the office 24 hours in advance to send orders.  If you have any questions regarding directions or hours of operation,  please call [phone].   Just as a reminder please drink plenty of water prior to coming for your lab work. Thanks!

## 2018-12-28 NOTE — Telephone Encounter (Signed)
Bone density has been ordered already.

## 2018-12-29 ENCOUNTER — Encounter: Payer: Self-pay | Admitting: Family Medicine

## 2018-12-29 LAB — URINE CULTURE
MICRO NUMBER:: 1078644
SPECIMEN QUALITY:: ADEQUATE

## 2018-12-29 LAB — CBC WITH DIFFERENTIAL/PLATELET
Absolute Monocytes: 570 cells/uL (ref 200–950)
Basophils Absolute: 30 cells/uL (ref 0–200)
Basophils Relative: 0.6 %
Eosinophils Absolute: 50 cells/uL (ref 15–500)
Eosinophils Relative: 1 %
HCT: 33.4 % — ABNORMAL LOW (ref 35.0–45.0)
Hemoglobin: 11 g/dL — ABNORMAL LOW (ref 11.7–15.5)
Lymphs Abs: 1160 cells/uL (ref 850–3900)
MCH: 30.8 pg (ref 27.0–33.0)
MCHC: 32.9 g/dL (ref 32.0–36.0)
MCV: 93.6 fL (ref 80.0–100.0)
MPV: 11.4 fL (ref 7.5–12.5)
Monocytes Relative: 11.4 %
Neutro Abs: 3190 cells/uL (ref 1500–7800)
Neutrophils Relative %: 63.8 %
Platelets: 185 10*3/uL (ref 140–400)
RBC: 3.57 10*6/uL — ABNORMAL LOW (ref 3.80–5.10)
RDW: 11.6 % (ref 11.0–15.0)
Total Lymphocyte: 23.2 %
WBC: 5 10*3/uL (ref 3.8–10.8)

## 2018-12-29 LAB — COMPLETE METABOLIC PANEL WITH GFR
AG Ratio: 1.5 (calc) (ref 1.0–2.5)
ALT: 14 U/L (ref 6–29)
AST: 24 U/L (ref 10–35)
Albumin: 4.1 g/dL (ref 3.6–5.1)
Alkaline phosphatase (APISO): 54 U/L (ref 37–153)
BUN/Creatinine Ratio: 26 (calc) — ABNORMAL HIGH (ref 6–22)
BUN: 33 mg/dL — ABNORMAL HIGH (ref 7–25)
CO2: 25 mmol/L (ref 20–32)
Calcium: 9.2 mg/dL (ref 8.6–10.4)
Chloride: 101 mmol/L (ref 98–110)
Creat: 1.26 mg/dL — ABNORMAL HIGH (ref 0.60–0.88)
GFR, Est African American: 45 mL/min/{1.73_m2} — ABNORMAL LOW (ref >=60)
GFR, Est Non African American: 39 mL/min/{1.73_m2} — ABNORMAL LOW (ref >=60)
Globulin: 2.7 g/dL (calc) (ref 1.9–3.7)
Glucose, Bld: 81 mg/dL (ref 65–99)
Potassium: 5.2 mmol/L (ref 3.5–5.3)
Sodium: 137 mmol/L (ref 135–146)
Total Bilirubin: 0.3 mg/dL (ref 0.2–1.2)
Total Protein: 6.8 g/dL (ref 6.1–8.1)

## 2018-12-29 NOTE — Progress Notes (Signed)
Creatinine is mildly elevated than her baseline.  Most likely due to the use of diuretics.

## 2019-01-03 ENCOUNTER — Encounter: Payer: Self-pay | Admitting: Family Medicine

## 2019-01-04 MED ORDER — GABAPENTIN 300 MG PO CAPS: ORAL_CAPSULE | ORAL | 1 refills | Status: DC

## 2019-01-04 NOTE — Addendum Note (Signed)
Addended by: Lamar Blinks C on: 01/04/2019 05:20 AM   Modules accepted: Orders

## 2019-01-18 ENCOUNTER — Other Ambulatory Visit (HOSPITAL_BASED_OUTPATIENT_CLINIC_OR_DEPARTMENT_OTHER): Payer: Medicare Other

## 2019-01-18 ENCOUNTER — Ambulatory Visit (HOSPITAL_BASED_OUTPATIENT_CLINIC_OR_DEPARTMENT_OTHER): Payer: Medicare Other

## 2019-01-20 NOTE — Progress Notes (Signed)
Virtual Visit via Video Note  I connected with patient on 01/21/19 at 11:00 AM EST by audio enabled telemedicine application and verified that I am speaking with the correct person using two identifiers.   THIS ENCOUNTER IS A VIRTUAL VISIT DUE TO COVID-19 - PATIENT WAS NOT SEEN IN THE OFFICE. PATIENT HAS CONSENTED TO VIRTUAL VISIT / TELEMEDICINE VISIT   Location of patient: home  Location of provider: office  I discussed the limitations of evaluation and management by telemedicine and the availability of in person appointments. The patient expressed understanding and agreed to proceed.   Subjective:   [name] is a 85 y.o. female who presents for Medicare Annual (Subsequent) preventive examination.  Review of Systems:  Home Safety/Smoke Alarms: Feels safe in home. Smoke alarms in place.  Lives alone in 2 story home. Does not go upstairs. Has caregiver 4 days/ week for 4 hrs each time. Walk in shower w/ seat. Daughter lives nearby (3 blocks away). Pt has a walker she uses when checking mail.  Female:      Mammo- scheduled 01/28/19       Dexa scan-  scheduled 01/28/19    Objective:     Vitals: Unable to assess. This visit is enabled though telemedicine due to Covid 19.   Advanced Directives 01/21/2019 04/10/2018 04/09/2018 01/25/2018 01/24/2018 11/27/2017 11/27/2017  Does Patient Have a Medical Advance Directive? _0  Yes Yes  Type of Paramedic of [place];Living will [place];Living will [facility];Living will - [place];Living will [place];Living will [place];Living will  Does patient want to make changes to medical advance directive? No - Patient declined No - Patient declined - No - Patient declined - No - Patient declined No - Patient declined  Copy of [place] in Chart? No - copy requested - - - No - copy requested  No - copy requested No - copy requested  Would patient like information on creating a medical advance directive? - - - - - - -  Pre-existing out of facility DNR order (yellow form or pink MOST form) - - - - - - -    Tobacco Social History   Tobacco Use  Smoking Status Never Smoker  Smokeless Tobacco Never Used     Counseling given: Not Answered   Clinical Intake: Pain : No/denies pain     Past Medical History:  Diagnosis Date  . Arthralgia of multiple joints    followed by dr Gerilyn Nestle  . Arthritis   . Cardiomyopathy ([place])   . Chronic constipation   . Chronic inflammatory arthritis    rhemotolgist-  dr a. Gerilyn Nestle [facility])  . Dry eyes    eye drops used   . GERD (gastroesophageal reflux disease)   . H/O discoid lupus erythematosus   . Hiatal hernia   . History of colon polyps   . Hypothyroidism   . Iron deficiency anemia   . LBBB (left bundle branch block) 2010  . Mitchell's disease (erythromelalgia) [facility])    neurologist-  dr patel  . Nocturia   . Non-small cell cancer of right lung [facility]) surgeon-- dr gerhardt/  oncologist-  dr Julien Nordmann--- per lov notes no recurrence/   11-18-2017 per pt denies any symptoms   dx 2015--  Stage IIA (T2b,N0,M0) , +EGFR  mutation in exon 21, non-small cell adenocarcinoma right upper lobe---  s/p  Right upper lobectomy ,  right middley wedge resection and node dissection---  no chemo or radiation therapy  . OA (osteoarthritis)    hands  . Osteoporosis   . PONV (postoperative nausea and vomiting)    likes phenergan  . Raynaud's phenomenon 1965  . Renal insufficiency   . Rheumatoid arthritis ([place])   . Sciatica   . Scoliosis   . Sjogren's syndrome [facility])    Past Surgical History:  Procedure Laterality Date  . ANTERIOR HIP REVISION Right 11/27/2017   Procedure: RIGHT HIP ACETABULAR REVISION;  Surgeon: [name], MD;  Location: [facility];  Service: Orthopedics;  Laterality: Right;  . ANTERIOR HIP REVISION Right  01/24/2018   Procedure: OPEN REDUCTION OF DISLOCATED ANTERIOR HIP WITH REVISION OF LINER AND HIP BALL;  Surgeon: [name], MD;  [facility]: [facility];  Service: Orthopedics;  Laterality: Right;  . APPENDECTOMY  1950s  . BIOPSY  04/14/2018   Procedure: BIOPSY;  Surgeon: Yetta Flock, MD;  Location: [place];  Service: Gastroenterology;;  . BIOPSY  04/16/2018   Procedure: BIOPSY;  Surgeon: Irving Copas., MD;  Location: [facility];  Service: Gastroenterology;;  . CARDIOVASCULAR STRESS TEST  12/2008    mild fixed basal to mid septal perfusion defect felt likely due to artifact from LBBB, no ischemia, EF 58%  . COLONOSCOPY    . COLONOSCOPY WITH PROPOFOL N/A 04/16/2018   Procedure: COLONOSCOPY WITH PROPOFOL;  Surgeon: Rush Landmark Telford Nab., MD;  Location: [place];  Service: Gastroenterology;  Laterality: N/A;  . ESOPHAGOGASTRODUODENOSCOPY (EGD) WITH PROPOFOL N/A 04/14/2018   Procedure: ESOPHAGOGASTRODUODENOSCOPY (EGD) WITH PROPOFOL;  Surgeon: Yetta Flock, MD;  Location: [place];  Service: Gastroenterology;  Laterality: N/A;  . FEMORAL-POPLITEAL BYPASS GRAFT Right 04/10/2018   Procedure: REPAIR RIGHT FEMORAL ARTERY PSEUDOANEURYSM, RETROPERITONEAL EXPOSURE OF ILIAC ARTERY, RIGHT POPLITEAL EMBOLECTOMY;  Surgeon: Angelia Mould, MD;  Location: [place];  Service: Vascular;  Laterality: Right;  . LYMPH NODE DISSECTION Right 06/07/2013   Procedure: LYMPH NODE DISSECTION;  Surgeon: [name], MD;  Location: [place];  Service: Thoracic;  Laterality: Right;  . PATCH ANGIOPLASTY Right 04/10/2018   Procedure: PATCH  ANGIOPLASTY OF RIGHT FEMORAL ARTERY USING BOVINE PATCH, PATCH ANGIOPLASTY OF RIGHT POPLITEAL ARTERY USING BOVINE PATCH;  Surgeon: Angelia Mould, MD;  Location: [place];  Service: Vascular;  Laterality: Right;  . [place]   "large incision from chest to up to shoulder, the nerves were tied together, for raynaud's   . THORACOTOMY  06/07/2013   Procedure: MINI/LIMITED THORACOTOMY; right middle lobe wedge resection;  Surgeon: [name], MD;  Location: [place];  Service: Thoracic;;  . TONSILLECTOMY  child  . TOTAL ABDOMINAL HYSTERECTOMY  1980's    W/ BSO  . TOTAL HIP ARTHROPLASTY Right 04/28/2014   Procedure: RIGHT TOTAL HIP ARTHROPLASTY ANTERIOR APPROACH;  Surgeon: [name], MD;  [facility];  Service: Orthopedics;  Laterality: Right;  . TRANSTHORACIC ECHOCARDIOGRAM  12/11/2008   ef 07-86%, grade 1 diastolic dysfunction/  mild LAE/  mild AR and MR/  trivial TR  . VIDEO ASSISTED THORACOSCOPY (VATS)/WEDGE RESECTION Right 06/07/2013   Procedure: VIDEO ASSISTED THORACOSCOPY (VATS)/right upper lobectomy, On Q;  Surgeon: [name], MD;  Location: [place];  Service: Thoracic;  Laterality: Right;  Marland Kitchen VIDEO BRONCHOSCOPY N/A 06/07/2013   Procedure: VIDEO BRONCHOSCOPY;  Surgeon: [name], MD;  Location: [place] [facility];  Service: Thoracic;  Laterality: N/A;  . VIDEO BRONCHOSCOPY WITH ENDOBRONCHIAL NAVIGATION N/A 05/04/2013  Procedure: VIDEO BRONCHOSCOPY WITH ENDOBRONCHIAL NAVIGATION;  Surgeon: [name], MD;  Location: [facility] OR;  Service: Thoracic;  Laterality: N/A;   Family History  Problem Relation Age of Onset  . Coronary artery disease Father   . Colon cancer Father   . Diabetes Father   . Cancer Father        colon  . Other Mother 77       MVA  . Healthy Sister   . Healthy Brother   . Healthy Daughter   . Hypothyroidism Daughter   . Other Brother        killed in war  . Pneumonia Sister   . Healthy Daughter   . Esophageal cancer Neg Hx   . Kidney disease Neg Hx   . Liver disease Neg Hx    Social History   Socioeconomic History  . Marital status: Widowed    Spouse name: Not on file  . Number of children: 2  . Years of education: Not on file  . Highest education level: Not on file  Occupational History  . Occupation: n/a  Social Needs  . Financial resource  strain: Not on file  . Food insecurity    Worry: Not on file    Inability: Not on file  . Transportation needs    Medical: Not on file    Non-medical: Not on file  Tobacco Use  . Smoking status: Never Smoker  . Smokeless tobacco: Never Used  Substance and Sexual Activity  . Alcohol use: Not Currently    Frequency: Never  . Drug use: Never  . Sexual activity: Not Currently    Birth control/protection: Surgical  Lifestyle  . Physical activity    Days per week: Not on file    Minutes per session: Not on file  . Stress: Not on file  Relationships  . Social Herbalist on phone: Not on file    Gets together: Not on file    Attends religious service: Not on file    Active member of club or organization: Not on file    Attends meetings of clubs or organizations: Not on file    Relationship status: Not on file  Other Topics Concern  . Not on file  Social History Narrative   Lives with husband, daughter and grandchild local.   Highest level of education:  masters in education admin and technology    Outpatient Encounter Medications as of 01/21/2019  Medication Sig  . Acetaminophen (TYLENOL EXTRA STRENGTH PO) Take 2 tablets by mouth every 6 (six) hours as needed.  . Biotin 1000 MCG tablet Take 1,000 mcg by mouth daily.   . Calcium Carbonate-Vitamin D (CALCIUM 500 + D) 500-125 MG-UNIT TABS Take 1 tablet by mouth daily.   . carvedilol (COREG) 3.125 MG tablet Take 3.125 mg by mouth 2 (two) times daily.  . diclofenac sodium (VOLTAREN) 1 % GEL Apply topically as needed.  . Diclofenac Sodium 3 % GEL Apply a pea sized amount to affected joint twice daily as needed.  . docusate sodium (STOOL SOFTENER) 100 MG capsule Take 100 mg by mouth 2 (two) times daily.    . furosemide (LASIX) 20 MG tablet Take 1 tablet (20 mg total) by mouth daily.  Marland Kitchen gabapentin (NEURONTIN) 300 MG capsule TAKE 300MG IN THE MORNING, 300MG IN THE AFTERNOON, AND 600MG AT BEDTIME.  Marland Kitchen Glucosamine-Chondroit-Vit  C-Mn (GLUCOSAMINE CHONDR 1500 COMPLX PO) Take 1 capsule by mouth daily.   Marland Kitchen HYDROcodone-acetaminophen (NORCO/VICODIN)  5-325 MG tablet Take 1 tablet by mouth every 4 (four) hours as needed for severe pain.  Marland Kitchen leflunomide (ARAVA) 10 MG tablet Take 1 tablet (10 mg total) by mouth daily.  Marland Kitchen levothyroxine (SYNTHROID) 88 MCG tablet TAKE 1 TABLET (88 MCG TOTAL) BY MOUTH DAILY BEFORE BREAKFAST.  . methocarbamol (ROBAXIN) 500 MG tablet TAKE 1 TABLET (500 MG TOTAL) BY MOUTH EVERY 6 (SIX) HOURS AS NEEDED FOR MUSCLE SPASMS.  . Multiple Vitamin (MULTIVITAMIN) tablet Take 1 tablet by mouth daily.    Marland Kitchen omeprazole (PRILOSEC) 20 MG capsule TAKE 1 CAPSULE BY MOUTH 2 TIMES DAILY BEFORE A MEAL.  . Probiotic Product (PROBIOTIC PO) Take by mouth daily.  Marland Kitchen senna-docusate (SENOKOT-S) 8.6-50 MG tablet Take 1 tablet by mouth at bedtime as needed for mild constipation.  Marland Kitchen spironolactone (ALDACTONE) 25 MG tablet Take 12.5 mg by mouth every morning.   . metoprolol succinate (TOPROL XL) 25 MG 24 hr tablet Take 0.5 tablets (12.5 mg total) by mouth daily. (Patient not taking: Reported on 12/13/2018)  . [DISCONTINUED] amoxicillin (AMOXIL) 500 MG capsule Take 1 capsule (500 mg total) by mouth 2 (two) times daily.  . [DISCONTINUED] cephALEXin (KEFLEX) 500 MG capsule Take 1 capsule (500 mg total) by mouth 2 (two) times daily. (Patient not taking: Reported on 12/28/2018)  . [DISCONTINUED] ondansetron (ZOFRAN ODT) 4 MG disintegrating tablet Take 1 tablet (4 mg total) by mouth every 6 (six) hours as needed for nausea or vomiting. (Patient not taking: Reported on 12/28/2018)   No facility-administered encounter medications on file as of 01/21/2019.     Activities of Daily Living In your present state of health, do you have any difficulty performing the following activities: 01/21/2019 04/10/2018  Hearing? N N  Vision? N N  Difficulty concentrating or making decisions? N N  Walking or climbing stairs? N Y  Dressing or bathing? N N   Doing errands, shopping? Y N  Comment No longer driving. -  Preparing Food and eating ? N -  Using the Toilet? N -  In the past six months, have you accidently leaked urine? Y -  Do you have problems with loss of bowel control? N -  Managing your Medications? N -  Managing your Finances? N -  Housekeeping or managing your Housekeeping? N -  Some recent data might be hidden    Patient Care Team: Copland, Gay Filler, MD as PCP - General (Family Medicine) Rigoberto Noel, MD as Consulting Physician (Pulmonary Disease) [name], MD as Consulting Physician (Internal Medicine) Curt Bears, MD as Consulting Physician (Oncology) [name], MD as Consulting Physician (Cardiothoracic Surgery) Zonia Kief, MD as Consulting Physician (Rehabilitation) Alda Berthold, DO as Consulting Physician (Neurology)    Assessment:   This is a routine wellness examination for Mckinzy. Physical assessment deferred to PCP.  Exercise Activities and Dietary recommendations Current Exercise Habits: Home exercise routine(PT), Time (Minutes): 30, Frequency (Times/Week): 7, Weekly Exercise (Minutes/Week): 210, Intensity: Mild, Exercise limited by: None identified Diet (meal preparation, eat out, water intake, caffeinated beverages, dairy products, fruits and vegetables): in general, a "healthy" diet  , well balanced   Goals    . Maintain independence       Fall Risk Fall Risk  01/21/2019 07/09/2017 09/15/2016 07/03/2016 07/02/2016  Falls in the past year? 1 No Yes Yes No  Number falls in past yr: 1 - 2 or more 1 -  Injury with Fall? 1 - No No -  Risk Factor Category  - - -  High Fall Risk -  Risk for fall due to : Impaired balance/gait - Other (Comment) - -  Risk for fall due to: Comment - - - - -  Follow up Education provided;Falls prevention discussed - Falls evaluation completed;Education provided;Falls prevention discussed Falls evaluation completed -    Depression Screen PHQ 2/9  Scores 07/09/2017 07/02/2016 05/22/2015 08/01/2014  PHQ - 2 Score 0 0 0 0  PHQ- 9 Score - 0 - -     Cognitive Function Ad8 score reviewed for issues:  Issues making decisions:no  Less interest in hobbies / activities:no  Repeats questions, stories (family complaining):no  Trouble using ordinary gadgets (microwave, computer, phone):no  Forgets the month or year: no  Mismanaging finances: no  Remembering appts:no  Daily problems with thinking and/or memory:no Ad8 score is=0  MMSE - Mini Mental State Exam 07/09/2017  Orientation to time 5  Orientation to Place 5  Registration 3  Attention/ Calculation 5  Recall 3  Language- name 2 objects 2  Language- repeat 1  Language- follow 3 step command 3  Language- read & follow direction 1  Write a sentence 1  Copy design 1  Total score 30        Immunization History  Administered Date(s) Administered  . Fluad Quad(high Dose 65+) 11/03/2018  . Influenza Split 11/21/2011  . Influenza Whole 11/29/2007, 11/29/2008, 11/29/2009  . Influenza, High Dose Seasonal PF 10/30/2012, 01/02/2015  . Influenza,inj,Quad PF,6+ Mos 11/22/2013, 11/14/2015  . Influenza-Unspecified 11/13/2016, 11/12/2017  . Pneumococcal Conjugate-13 05/22/2015  . Pneumococcal Polysaccharide-23 06/13/2013  . Tdap 08/17/2017  . Zoster 01/27/2014    Screening Tests Health Maintenance  Topic Date Due  . TETANUS/TDAP  08/18/2027  . INFLUENZA VACCINE  Completed  . DEXA SCAN  Completed  . PNA vac Low Risk Adult  Completed      Plan:   See you next year!  Keep up the great work!  I have personally reviewed and noted the following in the patient's chart:   . Medical and social history . Use of alcohol, tobacco or illicit drugs  . Current medications and supplements . Functional ability and status . Nutritional status . Physical activity . Advanced directives . List of other physicians . Hospitalizations, surgeries, and ER visits in previous 12 months .  Vitals . Screenings to include cognitive, depression, and falls . Referrals and appointments  In addition, I have reviewed and discussed with patient certain preventive protocols, quality metrics, and best practice recommendations. A written personalized care plan for preventive services as well as general preventive health recommendations were provided to patient.     Shela Nevin, [place]  01/21/2019

## 2019-01-21 ENCOUNTER — Other Ambulatory Visit: Payer: Self-pay

## 2019-01-21 ENCOUNTER — Ambulatory Visit (INDEPENDENT_AMBULATORY_CARE_PROVIDER_SITE_OTHER): Payer: Medicare Other | Admitting: *Deleted

## 2019-01-21 ENCOUNTER — Encounter: Payer: Self-pay | Admitting: *Deleted

## 2019-01-21 DIAGNOSIS — Z0000 Encounter for general adult medical examination without abnormal findings: Secondary | ICD-10-CM | POA: Diagnosis not present

## 2019-01-21 NOTE — Patient Instructions (Signed)
See you next year!  Keep up the great work!   [name] , Thank you for taking time to come for your Medicare Wellness Visit. I appreciate your ongoing commitment to your health goals. Please review the following plan we discussed and let me know if I can assist you in the future.   These are the goals we discussed: Goals    . Maintain independence       This is a list of the screening recommended for you and due dates:  Health Maintenance  Topic Date Due  . Tetanus Vaccine  08/18/2027  . Flu Shot  Completed  . DEXA scan (bone density measurement)  Completed  . Pneumonia vaccines  Completed    Preventive Care 101 Years and Older, Female Preventive care refers to lifestyle choices and visits with your health care provider that can promote health and wellness. This includes:  A yearly physical exam. This is also called an annual well check.  Regular dental and eye exams.  Immunizations.  Screening for certain conditions.  Healthy lifestyle choices, such as diet and exercise. What can I expect for my preventive care visit? Physical exam Your health care provider will check:  Height and weight. These may be used to calculate body mass index (BMI), which is a measurement that tells if you are at a healthy weight.  Heart rate and blood pressure.  Your skin for abnormal spots. Counseling Your health care provider may ask you questions about:  Alcohol, tobacco, and drug use.  Emotional well-being.  Home and relationship well-being.  Sexual activity.  Eating habits.  History of falls.  Memory and ability to understand (cognition).  Work and work Statistician.  Pregnancy and menstrual history. What immunizations do I need?  Influenza (flu) vaccine  This is recommended every year. Tetanus, diphtheria, and pertussis (Tdap) vaccine  You may need a Td booster every 10 years. Varicella (chickenpox) vaccine  You may need this vaccine if you have not already  been vaccinated. Zoster (shingles) vaccine  You may need this after age 46. Pneumococcal conjugate (PCV13) vaccine  One dose is recommended after age 45. Pneumococcal polysaccharide (PPSV23) vaccine  One dose is recommended after age 17. Measles, mumps, and rubella (MMR) vaccine  You may need at least one dose of MMR if you were born in 1957 or later. You may also need a second dose. Meningococcal conjugate (MenACWY) vaccine  You may need this if you have certain conditions. Hepatitis A vaccine  You may need this if you have certain conditions or if you travel or work in places where you may be exposed to hepatitis A. Hepatitis B vaccine  You may need this if you have certain conditions or if you travel or work in places where you may be exposed to hepatitis B. Haemophilus influenzae type b (Hib) vaccine  You may need this if you have certain conditions. You may receive vaccines as individual doses or as more than one vaccine together in one shot (combination vaccines). Talk with your health care provider about the risks and benefits of combination vaccines. What tests do I need? Blood tests  Lipid and cholesterol levels. These may be checked every 5 years, or more frequently depending on your overall health.  Hepatitis C test.  Hepatitis B test. Screening  Lung cancer screening. You may have this screening every year starting at age 8 if you have a 30-pack-year history of smoking and currently smoke or have quit within the past 15  years.  Colorectal cancer screening. All adults should have this screening starting at age 22 and continuing until age 39. Your health care provider may recommend screening at age 92 if you are at increased risk. You will have tests every 1-10 years, depending on your results and the type of screening test.  Diabetes screening. This is done by checking your blood sugar (glucose) after you have not eaten for a while (fasting). You may have this done  every 1-3 years.  Mammogram. This may be done every 1-2 years. Talk with your health care provider about how often you should have regular mammograms.  BRCA-related cancer screening. This may be done if you have a family history of breast, ovarian, tubal, or peritoneal cancers. Other tests  Sexually transmitted disease (STD) testing.  Bone density scan. This is done to screen for osteoporosis. You may have this done starting at age 34. Follow these instructions at home: Eating and drinking  Eat a diet that includes fresh fruits and vegetables, whole grains, lean protein, and low-fat dairy products. Limit your intake of foods with high amounts of sugar, saturated fats, and salt.  Take vitamin and mineral supplements as recommended by your health care provider.  Do not drink alcohol if your health care provider tells you not to drink.  If you drink alcohol: ? Limit how much you have to 0-1 drink a day. ? Be aware of how much alcohol is in your drink. In the [place] one drink equals one 12 oz bottle of beer (355 mL), one 5 oz glass of wine (148 mL), or one 1 oz glass of hard liquor (44 mL). Lifestyle  Take daily care of your teeth and gums.  Stay active. Exercise for at least 30 minutes on 5 or more days each week.  Do not use any products that contain nicotine or tobacco, such as cigarettes, e-cigarettes, and chewing tobacco. If you need help quitting, ask your health care provider.  If you are sexually active, practice safe sex. Use a condom or other form of protection in order to prevent STIs (sexually transmitted infections).  Talk with your health care provider about taking a low-dose aspirin or statin. What's next?  Go to your health care provider once a year for a well check visit.  Ask your health care provider how often you should have your eyes and teeth checked.  Stay up to date on all vaccines. This information is not intended to replace advice given to you by your  health care provider. Make sure you discuss any questions you have with your health care provider. Document Released: 03/02/2015 Document Revised: 01/28/2018 Document Reviewed: 01/28/2018 Elsevier Patient Education  2020 [employer].

## 2019-01-26 ENCOUNTER — Other Ambulatory Visit: Payer: Self-pay | Admitting: Rheumatology

## 2019-01-26 ENCOUNTER — Telehealth: Payer: Self-pay

## 2019-01-26 ENCOUNTER — Other Ambulatory Visit: Payer: Self-pay | Admitting: Orthopaedic Surgery

## 2019-01-26 MED ORDER — METHOCARBAMOL 500 MG PO TABS: 500.0000 mg | ORAL_TABLET | Freq: Four times a day (QID) | ORAL | 0 refills | Status: DC | PRN

## 2019-01-26 NOTE — Telephone Encounter (Signed)
Last Visit: 12/28/2018 Next Visit: 03/29/2019 Labs: 12/28/2018 Creatinine is mildly elevated than her baseline. Most likely due to the use of diuretics.  Okay to refill per Dr. Estanislado Pandy.

## 2019-01-26 NOTE — Telephone Encounter (Signed)
Refill request for Robaxin  [facility]

## 2019-01-28 ENCOUNTER — Other Ambulatory Visit: Payer: Self-pay

## 2019-01-28 ENCOUNTER — Ambulatory Visit (HOSPITAL_BASED_OUTPATIENT_CLINIC_OR_DEPARTMENT_OTHER)
Admission: RE | Admit: 2019-01-28 | Discharge: 2019-01-28 | Disposition: A | Discharge status: Home / Self Care | Payer: Medicare Other | Source: Ambulatory Visit | Attending: Family Medicine | Admitting: Family Medicine

## 2019-01-28 ENCOUNTER — Encounter: Payer: Self-pay | Admitting: Family Medicine

## 2019-01-28 ENCOUNTER — Encounter: Payer: Self-pay | Admitting: Rheumatology

## 2019-01-28 DIAGNOSIS — Z1231 Encounter for screening mammogram for malignant neoplasm of breast: Secondary | ICD-10-CM | POA: Insufficient documentation

## 2019-01-28 DIAGNOSIS — M810 Age-related osteoporosis without current pathological fracture: Secondary | ICD-10-CM | POA: Insufficient documentation

## 2019-01-28 DIAGNOSIS — M8580 Other specified disorders of bone density and structure, unspecified site: Secondary | ICD-10-CM | POA: Diagnosis present

## 2019-02-02 ENCOUNTER — Telehealth: Payer: Self-pay

## 2019-02-02 NOTE — Telephone Encounter (Signed)
Advised patient DEXA scan is consistent with osteoporosis. Patient will schedule a follow-up appointment after Christmas.

## 2019-02-02 NOTE — Telephone Encounter (Signed)
-----   Message from Bo Merino, MD sent at 02/01/2019  4:45 PM EST ----- DEXA scan is consistent with osteoporosis.  Please schedule a follow-up appointment to discuss results and treatment options.  Appointment can be schedule at patient's convenience. SD ----- Message ----- From: Earnestine Mealing, CMA Sent: 01/28/2019   3:37 PM EST To: Bo Merino, MD

## 2019-02-13 ENCOUNTER — Encounter: Payer: Self-pay | Admitting: Family Medicine

## 2019-02-14 ENCOUNTER — Other Ambulatory Visit: Payer: Self-pay | Admitting: Family Medicine

## 2019-02-14 MED ORDER — ALBUTEROL SULFATE HFA 108 (90 BASE) MCG/ACT IN AERS: 2.0000 | INHALATION_SPRAY | Freq: Four times a day (QID) | RESPIRATORY_TRACT | 1 refills | Status: DC | PRN

## 2019-02-22 ENCOUNTER — Ambulatory Visit (HOSPITAL_COMMUNITY): Payer: Medicare HMO

## 2019-02-22 ENCOUNTER — Other Ambulatory Visit: Payer: Medicare Other

## 2019-02-28 ENCOUNTER — Telehealth: Payer: Self-pay | Admitting: Internal Medicine

## 2019-02-28 NOTE — Telephone Encounter (Signed)
Scheduled appt per 1/11 sch message - pt is aware of apt date and time

## 2019-03-01 ENCOUNTER — Ambulatory Visit: Payer: Medicare Other | Admitting: Internal Medicine

## 2019-03-01 ENCOUNTER — Other Ambulatory Visit: Payer: Self-pay | Admitting: Family Medicine

## 2019-03-03 ENCOUNTER — Other Ambulatory Visit: Payer: Self-pay | Admitting: Family Medicine

## 2019-03-07 NOTE — Progress Notes (Signed)
[facility] at [address], [place], [place] [address] [phone]  Date:  03/09/2019   Name:  [name]   DOB:  [birth year]   MRN:  [id number]  PCP:  Darreld Mclean, MD    Chief Complaint: Hypertension (4 month follow up), Lung Check, and Leg Pain (hx of blot clot, sharp pain "feels like a hot spot" , no redness)   History of Present Illness:  [name] is a 86 y.o. 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 Her daughter was helping care for her and got covid as well-thankfully they both recovered fully  She does notice that the veins in her right leg seem more prominent, wonders if this could be an issue  She also had an episode of nausea while getting dressed this morning.  She went to the bathroom and had a bowel movement, took a Zofran.  Nausea is now resolved.  No vomiting.  No chest pain or shortness of breath I saw her in October for UTI  Can suggest Shingrix  Thyroid checked 1/2- wnl  Lab Results  Component Value Date   TSH 3.84 02/18/2018     Patient Active Problem List   Diagnosis Date Noted  . Popliteal artery occlusion, right ([place]) 04/10/2018  . HTN (hypertension) 04/10/2018  . Femoral artery pseudo-aneurysm, right ([place]) 04/09/2018  . Anemia of chronic disease 02/24/2018  .  Unstable right hip arthroplasty 01/24/2018  . History of revision of total replacement of right hip joint 01/24/2018  . Hypovolemic shock ([place])   . Hyperkalemia   . Hyponatremia   . Failed total hip arthroplasty ([place]) 11/27/2017  . Status post revision of total hip 11/27/2017  . Elevated cholesterol 10/11/2015  . 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[place]) 10/04/2014  . Bilateral leg edema 08/01/2014  . Elevated BP 08/01/2014  . Rheumatoid arthritis involving multiple joints ([place]) 05/30/2014  . Status post total replacement of right hip 04/28/2014  . Long-term use of high-risk medication 11/11/2013  . Symptomatic anemia 11/11/2013  . Neuropathic pain 07/07/2013  . Constipation due to pain medication 07/07/2013  . Protein-calorie malnutrition, severe ([place]) 06/08/2013  . Lung cancer, Right upper lobe 05/08/2013  . Sciatica of right side 08/13/2011  . Osteoporosis 03/07/2010  . PARESTHESIA 03/07/2010  . CT, CHEST, ABNORMAL 12/18/2008  . ABNORMAL ECHOCARDIOGRAM 12/14/2008  . [place] SYNDROME 11/29/2008  . HYPOGLYCEMIA 06/29/2006  . RAYNAUD'S DISEASE 06/29/2006    Past Medical History:  Diagnosis Date  . Arthralgia of multiple joints    followed by dr Gerilyn Nestle  . Arthritis   . Cardiomyopathy ([place])   . Chronic constipation   . Chronic inflammatory arthritis    rhemotolgist-  dr a. Gerilyn Nestle [facility])  . Dry eyes    eye drops used   . GERD (  gastroesophageal reflux disease)   . H/O discoid lupus erythematosus   . Hiatal hernia   . History of colon polyps   . Hypothyroidism   . Iron deficiency anemia   . LBBB (left bundle branch block) 2010  . Mitchell's disease (erythromelalgia) [facility])    neurologist-  dr patel  . Nocturia   . Non-small cell cancer of right lung [facility]) surgeon-- dr gerhardt/  oncologist-  dr Julien Nordmann--- per lov notes no recurrence/   11-18-2017 per pt denies any symptoms   dx 2015--  Stage IIA (T2b,N0,M0) , +EGFR  mutation in exon 21, non-small cell  adenocarcinoma right upper lobe---  s/p  Right upper lobectomy , right middley wedge resection and node dissection---  no chemo or radiation therapy  . OA (osteoarthritis)    hands  . Osteoporosis   . PONV (postoperative nausea and vomiting)    likes phenergan  . Raynaud's phenomenon 1965  . Renal insufficiency   . Rheumatoid arthritis ([place])   . Sciatica   . Scoliosis   . Sjogren's syndrome [facility])     Past Surgical History:  Procedure Laterality Date  . ANTERIOR HIP REVISION Right 11/27/2017   Procedure: RIGHT HIP ACETABULAR REVISION;  Surgeon: [name], MD;  Location: [facility];  Service: Orthopedics;  Laterality: Right;  . ANTERIOR HIP REVISION Right 01/24/2018   Procedure: OPEN REDUCTION OF DISLOCATED ANTERIOR HIP WITH REVISION OF LINER AND HIP BALL;  Surgeon: [name], MD;  Location: [facility];  Service: Orthopedics;  Laterality: Right;  . APPENDECTOMY  1950s  . BIOPSY  04/14/2018   Procedure: BIOPSY;  Surgeon: Yetta Flock, MD;  Location: [place];  Service: Gastroenterology;;  . BIOPSY  04/16/2018   Procedure: BIOPSY;  Surgeon: Irving Copas., MD;  Location: [facility];  Service: Gastroenterology;;  . CARDIOVASCULAR STRESS TEST  12/2008    mild fixed basal to mid septal perfusion defect felt likely due to artifact from LBBB, no ischemia, EF 58%  . COLONOSCOPY    . COLONOSCOPY WITH PROPOFOL N/A 04/16/2018   Procedure: COLONOSCOPY WITH PROPOFOL;  Surgeon: Rush Landmark Telford Nab., MD;  Location: [place];  Service: Gastroenterology;  Laterality: N/A;  . ESOPHAGOGASTRODUODENOSCOPY (EGD) WITH PROPOFOL N/A 04/14/2018   Procedure: ESOPHAGOGASTRODUODENOSCOPY (EGD) WITH PROPOFOL;  Surgeon: Yetta Flock, MD;  Location: [place];  Service: Gastroenterology;  Laterality: N/A;  . FEMORAL-POPLITEAL BYPASS GRAFT Right 04/10/2018   Procedure: REPAIR RIGHT FEMORAL ARTERY PSEUDOANEURYSM, RETROPERITONEAL EXPOSURE OF ILIAC ARTERY, RIGHT  POPLITEAL EMBOLECTOMY;  Surgeon: Angelia Mould, MD;  Location: [place];  Service: Vascular;  Laterality: Right;  . LYMPH NODE DISSECTION Right 06/07/2013   Procedure: LYMPH NODE DISSECTION;  Surgeon: [name], MD;  Location: [place];  Service: Thoracic;  Laterality: Right;  . PATCH ANGIOPLASTY Right 04/10/2018   Procedure: PATCH  ANGIOPLASTY OF RIGHT FEMORAL ARTERY USING BOVINE PATCH, PATCH ANGIOPLASTY OF RIGHT POPLITEAL ARTERY USING BOVINE PATCH;  Surgeon: Angelia Mould, MD;  Location: [place];  Service: Vascular;  Laterality: Right;  . [place]   "large incision from chest to up to shoulder, the nerves were tied together, for raynaud's  . THORACOTOMY  06/07/2013   Procedure: MINI/LIMITED THORACOTOMY; right middle lobe wedge resection;  Surgeon: [name], MD;  Location: [place];  Service: Thoracic;;  . TONSILLECTOMY  child  . TOTAL ABDOMINAL HYSTERECTOMY  1980's    W/ BSO  . TOTAL HIP ARTHROPLASTY Right 04/28/2014   Procedure: RIGHT TOTAL HIP  ARTHROPLASTY ANTERIOR APPROACH;  Surgeon: [name], MD;  [facility]: [facility];  Service: Orthopedics;  Laterality: Right;  . TRANSTHORACIC ECHOCARDIOGRAM  12/11/2008   ef 33-29%, grade 1 diastolic dysfunction/  mild LAE/  mild AR and MR/  trivial TR  . VIDEO ASSISTED THORACOSCOPY (VATS)/WEDGE RESECTION Right 06/07/2013   Procedure: VIDEO ASSISTED THORACOSCOPY (VATS)/right upper lobectomy, On Q;  Surgeon: [name], MD;  Location: [place];  Service: Thoracic;  Laterality: Right;  [place] VIDEO BRONCHOSCOPY N/A 06/07/2013   Procedure: VIDEO BRONCHOSCOPY;  Surgeon: [name], MD;  Location[facility];  Service: Thoracic;  Laterality: N/A;  . VIDEO BRONCHOSCOPY WITH ENDOBRONCHIAL NAVIGATION N/A 05/04/2013   Procedure: VIDEO BRONCHOSCOPY WITH ENDOBRONCHIAL NAVIGATION;  Surgeon: [name], MD;  Location: [place];  Service: Thoracic;  Laterality: N/A;    Social History   Tobacco Use  . Smoking  status: Never Smoker  . Smokeless tobacco: Never Used  Substance Use Topics  . Alcohol use: Not Currently  . Drug use: Never    Family History  Problem Relation Age of Onset  . Coronary artery disease Father   . Colon cancer Father   . Diabetes Father   . Cancer Father        colon  . Other Mother 16       MVA  . Healthy Sister   . Healthy Brother   . Healthy Daughter   . Hypothyroidism Daughter   . Other Brother        killed in war  . Pneumonia Sister   . Healthy Daughter   . Esophageal cancer Neg Hx   . Kidney disease Neg Hx   . Liver disease Neg Hx     Allergies  Allergen Reactions  . Amlodipine Rash  . Prochlorperazine Edisylate Anaphylaxis    Compazine--- tongue swells and rash  . Aspirin Other (See Comments)    nose bleeds. Cannot take NSAIDS   . Cymbalta [Duloxetine Hcl] Diarrhea, Nausea And Vomiting and Other (See Comments)    Increased blood pressure  . Pamelor [Nortriptyline Hcl] Diarrhea and Nausea Only    Increased Heart rate and BP    Medication list has been reviewed and updated.  Current Outpatient Medications on File Prior to Visit  Medication Sig Dispense Refill  . Acetaminophen (TYLENOL EXTRA STRENGTH PO) Take 2 tablets by mouth every 6 (six) hours as needed.    . Biotin 1000 MCG tablet Take 1,000 mcg by mouth daily.     . Calcium Carbonate-Vitamin D (CALCIUM 500 + D) 500-125 MG-UNIT TABS Take 1 tablet by mouth daily.     . carvedilol (COREG) 3.125 MG tablet Take 3.125 mg by mouth 2 (two) times daily.    . diclofenac sodium (VOLTAREN) 1 % GEL Apply topically as needed.    . Diclofenac Sodium 3 % GEL Apply a pea sized amount to affected joint twice daily as needed. 100 g 2  . docusate sodium (STOOL SOFTENER) 100 MG capsule Take 100 mg by mouth 2 (two) times daily.      Marland Kitchen gabapentin (NEURONTIN) 300 MG capsule TAKE '300MG'$  IN THE MORNING, '300MG'$  IN THE AFTERNOON, AND '600MG'$  AT BEDTIME. 360 capsule 1  . Glucosamine-Chondroit-Vit C-Mn (GLUCOSAMINE  CHONDR 1500 COMPLX PO) Take 1 capsule by mouth daily.     Marland Kitchen HYDROcodone-acetaminophen (NORCO/VICODIN) 5-325 MG tablet Take 1 tablet by mouth every 4 (four) hours as needed for severe pain. 40 tablet 0  . leflunomide (ARAVA) 10 MG tablet TAKE  1 TABLET BY MOUTH EVERY DAY 90 tablet 0  . levothyroxine (SYNTHROID) 88 MCG tablet TAKE 1 TABLET (88 MCG TOTAL) BY MOUTH DAILY BEFORE BREAKFAST. 90 tablet 1  . methocarbamol (ROBAXIN) 500 MG tablet Take 1 tablet (500 mg total) by mouth every 6 (six) hours as needed for muscle spasms. 40 tablet 0  . metoprolol succinate (TOPROL XL) 25 MG 24 hr tablet Take 0.5 tablets (12.5 mg total) by mouth daily. 45 tablet 3  . Multiple Vitamin (MULTIVITAMIN) tablet Take 1 tablet by mouth daily.      Marland Kitchen omeprazole (PRILOSEC) 20 MG capsule TAKE 1 CAPSULE BY MOUTH 2 TIMES DAILY BEFORE A MEAL. 180 capsule 1  . Probiotic Product (PROBIOTIC PO) Take by mouth daily.    Marland Kitchen PROVENTIL HFA 108 (90 Base) MCG/ACT inhaler 2 PUFFS EVERY 6 HOURS AS NEEDED 6.7 g 2  . senna-docusate (SENOKOT-S) 8.6-50 MG tablet Take 1 tablet by mouth at bedtime as needed for mild constipation. 30 tablet 0  . spironolactone (ALDACTONE) 25 MG tablet Take 12.5 mg by mouth every morning.   2   No current facility-administered medications on file prior to visit.    Review of Systems:  As per HPI- otherwise negative.   Physical Examination: Vitals:   03/09/19 1052  BP: (!) 142/80  Pulse: 88  Resp: 16  Temp: (!) 97.1 F (36.2 C)  SpO2: 99%   Vitals:   03/09/19 1052  Weight: 107 lb (48.5 kg)  Height: 5' (1.524 m)   Body mass index is 20.9 kg/m. Ideal Body Weight: Weight in (lb) to have BMI = 25: 127.7  GEN: WDWN, NAD, Non-toxic, A & O x 3, well-appearing and very spry for age 22: Atraumatic, Normocephalic. Neck supple. No masses, No LAD. Ears and Nose: No external deformity. CV: RRR, No M/G/R. No JVD. No thrill. No extra heart sounds. PULM: CTA B, no wheezes, crackles, rhonchi. No  retractions. No resp. distress. No accessory muscle use. ABD: S, NT, ND, +BS. No rebound. No HSM. EXTR: No c/c/e NEURO Normal gait.  PSYCH: Normally interactive. Conversant. Not depressed or anxious appearing.  Calm demeanor.  Well-healed incision in the right groin from vascular stent Both feet display strong pulses and normal sensation.  She does appear to have some possible venous congestion, mild, in the right leg.  The superficial veins are slightly more prominent, the skin is warmer and slightly hyperpigmented.  Likely due to her right hip operations. However, there is no ulceration, tenderness, or swelling to suggest a DVT or other problem  EKG today shows a left bundle branch block.  I called and discussed with her primary cardiologist, Dr. Stanford Breed.  He does not feel there is any acute change of her EKG   Assessment and Plan: Nausea - Plan: EKG 12-Lead  Essential hypertension  Failure of right total hip arthroplasty, subsequent encounter  Following up today for a couple of concerns Thankfully she recovered fully from her COVID-19 infection Blood pressure is under good control Her current symptoms and exam do not suggest a blood clot Non- specific nausea.  No abd tenderness, chest pain or shortness of breath.  EKG is stable.  Patient currently feels well, I have asked her to contact me if this returns Otherwise we plan to visit in 6 months Moderate medical decision making today This visit occurred during the SARS-CoV-2 public health emergency.  Safety protocols were in place, including screening questions prior to the visit, additional usage of staff PPE, and extensive cleaning  of exam room while observing appropriate contact time as indicated for disinfecting solutions.    Signed Lamar Blinks, MD

## 2019-03-08 ENCOUNTER — Other Ambulatory Visit: Payer: Self-pay

## 2019-03-09 ENCOUNTER — Encounter: Payer: Self-pay | Admitting: Family Medicine

## 2019-03-09 ENCOUNTER — Telehealth: Payer: Self-pay | Admitting: Cardiology

## 2019-03-09 ENCOUNTER — Ambulatory Visit: Payer: Medicare PPO | Admitting: Family Medicine

## 2019-03-09 VITALS — BP 142/80 | HR 88 | Temp 97.1°F | Resp 16 | Ht 60.0 in | Wt 107.0 lb

## 2019-03-09 DIAGNOSIS — T84010D Broken internal right hip prosthesis, subsequent encounter: Secondary | ICD-10-CM | POA: Diagnosis not present

## 2019-03-09 DIAGNOSIS — I10 Essential (primary) hypertension: Secondary | ICD-10-CM | POA: Diagnosis not present

## 2019-03-09 DIAGNOSIS — R110 Nausea: Secondary | ICD-10-CM

## 2019-03-09 NOTE — Telephone Encounter (Signed)
Contacted by Dr. Edilia Bo.  Patient presented with nausea but no chest pain.  She performed electrocardiogram which showed left bundle branch block which was unchanged compared to previous.  I did not recommend further evaluation. Kirk Ruths

## 2019-03-09 NOTE — Patient Instructions (Signed)
It was great to see you again today We don't see any evidence of an acute heart issue on your EKG- however if you have nausea again please let me know I suspect the burning pains you sometimes get in your leg are related to nerve damage from your surgery and nothing more ominous- however let me know if any change or concern

## 2019-03-10 ENCOUNTER — Inpatient Hospital Stay: Payer: Medicare PPO | Attending: Internal Medicine

## 2019-03-10 ENCOUNTER — Ambulatory Visit (HOSPITAL_COMMUNITY)
Admission: RE | Admit: 2019-03-10 | Discharge: 2019-03-10 | Disposition: A | Discharge status: Home / Self Care | Payer: Medicare PPO | Source: Ambulatory Visit | Attending: Internal Medicine | Admitting: Internal Medicine

## 2019-03-10 ENCOUNTER — Other Ambulatory Visit: Payer: Self-pay

## 2019-03-10 DIAGNOSIS — I10 Essential (primary) hypertension: Secondary | ICD-10-CM | POA: Insufficient documentation

## 2019-03-10 DIAGNOSIS — Z85118 Personal history of other malignant neoplasm of bronchus and lung: Secondary | ICD-10-CM | POA: Insufficient documentation

## 2019-03-10 DIAGNOSIS — C3490 Malignant neoplasm of unspecified part of unspecified bronchus or lung: Secondary | ICD-10-CM | POA: Diagnosis not present

## 2019-03-10 LAB — CMP (CANCER CENTER ONLY)
ALT: 16 U/L (ref 0–44)
AST: 29 U/L (ref 15–41)
Albumin: 3.5 g/dL (ref 3.5–5.0)
Alkaline Phosphatase: 54 U/L (ref 38–126)
Anion gap: 7 (ref 5–15)
BUN: 33 mg/dL — ABNORMAL HIGH (ref 8–23)
CO2: 26 mmol/L (ref 22–32)
Calcium: 9.1 mg/dL (ref 8.9–10.3)
Chloride: 104 mmol/L (ref 98–111)
Creatinine: 1.47 mg/dL — ABNORMAL HIGH (ref 0.44–1.00)
GFR, Est AFR Am: 37 mL/min — ABNORMAL LOW (ref >60)
GFR, Estimated: 32 mL/min — ABNORMAL LOW (ref >60)
Glucose, Bld: 84 mg/dL (ref 70–99)
Potassium: 5.2 mmol/L — ABNORMAL HIGH (ref 3.5–5.1)
Sodium: 137 mmol/L (ref 135–145)
Total Bilirubin: <0.2 mg/dL — ABNORMAL LOW (ref 0.3–1.2)
Total Protein: 6.9 g/dL (ref 6.5–8.1)

## 2019-03-10 LAB — CBC WITH DIFFERENTIAL (CANCER CENTER ONLY)
Abs Immature Granulocytes: 0.02 10*3/uL (ref 0.00–0.07)
Basophils Absolute: 0 10*3/uL (ref 0.0–0.1)
Basophils Relative: 1 %
Eosinophils Absolute: 0.1 10*3/uL (ref 0.0–0.5)
Eosinophils Relative: 1 %
HCT: 32 % — ABNORMAL LOW (ref 36.0–46.0)
Hemoglobin: 9.9 g/dL — ABNORMAL LOW (ref 12.0–15.0)
Immature Granulocytes: 1 %
Lymphocytes Relative: 23 %
Lymphs Abs: 1 10*3/uL (ref 0.7–4.0)
MCH: 29.5 pg (ref 26.0–34.0)
MCHC: 30.9 g/dL (ref 30.0–36.0)
MCV: 95.2 fL (ref 80.0–100.0)
Monocytes Absolute: 0.7 10*3/uL (ref 0.1–1.0)
Monocytes Relative: 15 %
Neutro Abs: 2.6 10*3/uL (ref 1.7–7.7)
Neutrophils Relative %: 59 %
Platelet Count: 257 10*3/uL (ref 150–400)
RBC: 3.36 MIL/uL — ABNORMAL LOW (ref 3.87–5.11)
RDW: 12.8 % (ref 11.5–15.5)
WBC Count: 4.4 10*3/uL (ref 4.0–10.5)
nRBC: 0 % (ref 0.0–0.2)

## 2019-03-10 MED ORDER — IOHEXOL 300 MG/ML  SOLN
75.0000 mL | Freq: Once | INTRAMUSCULAR | Status: AC | PRN
  Administered 2019-03-10: 60 mL via INTRAVENOUS

## 2019-03-10 MED ORDER — SODIUM CHLORIDE (PF) 0.9 % IJ SOLN
INTRAMUSCULAR | Status: AC
  Filled 2019-03-10: qty 50

## 2019-03-15 ENCOUNTER — Other Ambulatory Visit: Payer: Self-pay

## 2019-03-15 ENCOUNTER — Encounter: Payer: Self-pay | Admitting: Internal Medicine

## 2019-03-15 ENCOUNTER — Inpatient Hospital Stay (HOSPITAL_BASED_OUTPATIENT_CLINIC_OR_DEPARTMENT_OTHER): Payer: Medicare PPO | Admitting: Internal Medicine

## 2019-03-15 VITALS — BP 158/81 | HR 86 | Temp 98.2°F | Resp 20 | Ht 60.0 in | Wt 105.8 lb

## 2019-03-15 DIAGNOSIS — C3490 Malignant neoplasm of unspecified part of unspecified bronchus or lung: Secondary | ICD-10-CM

## 2019-03-15 DIAGNOSIS — I10 Essential (primary) hypertension: Secondary | ICD-10-CM

## 2019-03-15 DIAGNOSIS — D638 Anemia in other chronic diseases classified elsewhere: Secondary | ICD-10-CM | POA: Diagnosis not present

## 2019-03-15 DIAGNOSIS — C3411 Malignant neoplasm of upper lobe, right bronchus or lung: Secondary | ICD-10-CM

## 2019-03-15 DIAGNOSIS — Z85118 Personal history of other malignant neoplasm of bronchus and lung: Secondary | ICD-10-CM | POA: Diagnosis not present

## 2019-03-15 NOTE — Progress Notes (Signed)
[place] [phone]   [phone]  OFFICE PROGRESS NOTE  Copland, Gay Filler, MD [address] [place] [address]  DIAGNOSIS: Stage IIA (T2b, N0, M0) non-small cell lung cancer, adenocarcinoma with positive EGFR mutation in exon 21 ([id number]) presented with right upper lobe lung mass diagnosed in March of 2015.  PRIOR THERAPY: Status post right upper lobectomy with wedge resection of the right middle lobe under the care of Dr. Servando Snare on 06/07/2013  CURRENT THERAPY: Observation.  INTERVAL HISTORY: [name] 86 y.o. female returns to the clinic today for annual follow-up visit.  The patient is feeling fine today with no concerning complaints.  She denied having any current chest pain, shortness of breath, cough or hemoptysis.  She denied having any fever or chills.  She has no nausea, vomiting, diarrhea or constipation.  She does not take oral iron tablets as planned.  She is trying with iron rich diet.  The patient denied having any headache or visual changes.  She had repeat CT scan of the chest performed recently and she is here for evaluation and discussion of her risk her results.  MEDICAL HISTORY: Past Medical History:  Diagnosis Date  . Arthralgia of multiple joints    followed by dr Gerilyn Nestle  . Arthritis   . Cardiomyopathy ([place])   . Chronic constipation   . Chronic inflammatory arthritis    rhemotolgist-  dr a. Gerilyn Nestle [facility])  . Dry eyes    eye drops used   . GERD (gastroesophageal reflux disease)   . H/O discoid lupus erythematosus   . Hiatal hernia   . History of colon polyps   . Hypothyroidism   . Iron deficiency anemia   . LBBB (left bundle branch block) 2010  . Mitchell's disease (erythromelalgia) [facility])    neurologist-  dr patel  . Nocturia   . Non-small cell cancer of right lung [facility]) surgeon-- dr gerhardt/  oncologist-  dr Julien Nordmann--- per lov notes no recurrence/   11-18-2017 per pt  denies any symptoms   dx 2015--  Stage IIA (T2b,N0,M0) , +EGFR  mutation in exon 21, non-small cell adenocarcinoma right upper lobe---  s/p  Right upper lobectomy , right middley wedge resection and node dissection---  no chemo or radiation therapy  . OA (osteoarthritis)    hands  . Osteoporosis   . PONV (postoperative nausea and vomiting)    likes phenergan  . Raynaud's phenomenon 1965  . Renal insufficiency   . Rheumatoid arthritis ([place])   . Sciatica   . Scoliosis   . Sjogren's syndrome ([place])     ALLERGIES:  is allergic to amlodipine; prochlorperazine edisylate; aspirin; cymbalta [duloxetine hcl]; and pamelor [nortriptyline hcl].  MEDICATIONS:  Current Outpatient Medications  Medication Sig Dispense Refill  . Acetaminophen (TYLENOL EXTRA STRENGTH PO) Take 2 tablets by mouth every 6 (six) hours as needed.    . Biotin 1000 MCG tablet Take 1,000 mcg by mouth daily.     . Calcium Carbonate-Vitamin D (CALCIUM 500 + D) 500-125 MG-UNIT TABS Take 1 tablet by mouth daily.     . carvedilol (COREG) 3.125 MG tablet Take 3.125 mg by mouth 2 (two) times daily.    . diclofenac sodium (VOLTAREN) 1 % GEL Apply topically as needed.    . Diclofenac Sodium 3 % GEL Apply a pea sized amount to affected joint twice daily as needed. 100 g 2  . docusate sodium (STOOL  SOFTENER) 100 MG capsule Take 100 mg by mouth 2 (two) times daily.      Marland Kitchen gabapentin (NEURONTIN) 300 MG capsule TAKE '300MG'$  IN THE MORNING, '300MG'$  IN THE AFTERNOON, AND '600MG'$  AT BEDTIME. 360 capsule 1  . Glucosamine-Chondroit-Vit C-Mn (GLUCOSAMINE CHONDR 1500 COMPLX PO) Take 1 capsule by mouth daily.     Marland Kitchen HYDROcodone-acetaminophen (NORCO/VICODIN) 5-325 MG tablet Take 1 tablet by mouth every 4 (four) hours as needed for severe pain. 40 tablet 0  . leflunomide (ARAVA) 10 MG tablet TAKE 1 TABLET BY MOUTH EVERY DAY 90 tablet 0  . levothyroxine (SYNTHROID) 88 MCG tablet TAKE 1 TABLET (88 MCG TOTAL) BY MOUTH DAILY BEFORE BREAKFAST. 90 tablet 1  .  methocarbamol (ROBAXIN) 500 MG tablet Take 1 tablet (500 mg total) by mouth every 6 (six) hours as needed for muscle spasms. 40 tablet 0  . metoprolol succinate (TOPROL XL) 25 MG 24 hr tablet Take 0.5 tablets (12.5 mg total) by mouth daily. 45 tablet 3  . Multiple Vitamin (MULTIVITAMIN) tablet Take 1 tablet by mouth daily.      Marland Kitchen omeprazole (PRILOSEC) 20 MG capsule TAKE 1 CAPSULE BY MOUTH 2 TIMES DAILY BEFORE A MEAL. 180 capsule 1  . Probiotic Product (PROBIOTIC PO) Take by mouth daily.    Marland Kitchen PROVENTIL HFA 108 (90 Base) MCG/ACT inhaler 2 PUFFS EVERY 6 HOURS AS NEEDED 6.7 g 2  . senna-docusate (SENOKOT-S) 8.6-50 MG tablet Take 1 tablet by mouth at bedtime as needed for mild constipation. 30 tablet 0  . spironolactone (ALDACTONE) 25 MG tablet Take 12.5 mg by mouth every morning.   2   No current facility-administered medications for this visit.    SURGICAL HISTORY:  Past Surgical History:  Procedure Laterality Date  . ANTERIOR HIP REVISION Right 11/27/2017   Procedure: RIGHT HIP ACETABULAR REVISION;  Surgeon: [name], MD;  Location: [facility];  Service: Orthopedics;  Laterality: Right;  . ANTERIOR HIP REVISION Right 01/24/2018   Procedure: OPEN REDUCTION OF DISLOCATED ANTERIOR HIP WITH REVISION OF LINER AND HIP BALL;  Surgeon: [name], MD;  Location: [facility];  Service: Orthopedics;  Laterality: Right;  . APPENDECTOMY  1950s  . BIOPSY  04/14/2018   Procedure: BIOPSY;  Surgeon: Yetta Flock, MD;  Location: [place];  Service: Gastroenterology;;  . BIOPSY  04/16/2018   Procedure: BIOPSY;  Surgeon: Irving Copas., MD;  Location: [facility];  Service: Gastroenterology;;  . CARDIOVASCULAR STRESS TEST  12/2008    mild fixed basal to mid septal perfusion defect felt likely due to artifact from LBBB, no ischemia, EF 58%  . COLONOSCOPY    . COLONOSCOPY WITH PROPOFOL N/A 04/16/2018   Procedure: COLONOSCOPY WITH PROPOFOL;  Surgeon: Rush Landmark Telford Nab.,  MD;  Location: [place];  Service: Gastroenterology;  Laterality: N/A;  . ESOPHAGOGASTRODUODENOSCOPY (EGD) WITH PROPOFOL N/A 04/14/2018   Procedure: ESOPHAGOGASTRODUODENOSCOPY (EGD) WITH PROPOFOL;  Surgeon: Yetta Flock, MD;  Location: [place];  Service: Gastroenterology;  Laterality: N/A;  . FEMORAL-POPLITEAL BYPASS GRAFT Right 04/10/2018   Procedure: REPAIR RIGHT FEMORAL ARTERY PSEUDOANEURYSM, RETROPERITONEAL EXPOSURE OF ILIAC ARTERY, RIGHT POPLITEAL EMBOLECTOMY;  Surgeon: Angelia Mould, MD;  Location: [place];  Service: Vascular;  Laterality: Right;  . LYMPH NODE DISSECTION Right 06/07/2013   Procedure: LYMPH NODE DISSECTION;  Surgeon: [name], MD;  Location: [place];  Service: Thoracic;  Laterality: Right;  . PATCH ANGIOPLASTY Right 04/10/2018   Procedure: PATCH  ANGIOPLASTY OF RIGHT FEMORAL ARTERY USING BOVINE PATCH,  PATCH ANGIOPLASTY OF RIGHT POPLITEAL ARTERY USING BOVINE PATCH;  Surgeon: Angelia Mould, MD;  Location: [place];  Service: Vascular;  Laterality: Right;  . [place]   "large incision from chest to up to shoulder, the nerves were tied together, for raynaud's  . THORACOTOMY  06/07/2013   Procedure: MINI/LIMITED THORACOTOMY; right middle lobe wedge resection;  Surgeon: [name], MD;  Location: [place];  Service: Thoracic;;  . TONSILLECTOMY  child  . TOTAL ABDOMINAL HYSTERECTOMY  1980's    W/ BSO  . TOTAL HIP ARTHROPLASTY Right 04/28/2014   Procedure: RIGHT TOTAL HIP ARTHROPLASTY ANTERIOR APPROACH;  Surgeon: [name], MD;  Location: [facility];  Service: Orthopedics;  Laterality: Right;  . TRANSTHORACIC ECHOCARDIOGRAM  12/11/2008   ef 50-93%, grade 1 diastolic dysfunction/  mild LAE/  mild AR and MR/  trivial TR  . VIDEO ASSISTED THORACOSCOPY (VATS)/WEDGE RESECTION Right 06/07/2013   Procedure: VIDEO ASSISTED THORACOSCOPY (VATS)/right upper lobectomy, On Q;  Surgeon: [name], MD;  Location: [place];   Service: Thoracic;  Laterality: Right;  Marland Kitchen VIDEO BRONCHOSCOPY N/A 06/07/2013   Procedure: VIDEO BRONCHOSCOPY;  Surgeon: [name], MD;  Location: [facility];  Service: Thoracic;  Laterality: N/A;  . VIDEO BRONCHOSCOPY WITH ENDOBRONCHIAL NAVIGATION N/A 05/04/2013   Procedure: VIDEO BRONCHOSCOPY WITH ENDOBRONCHIAL NAVIGATION;  Surgeon: [name], MD;  Location: [place];  Service: Thoracic;  Laterality: N/A;    REVIEW OF SYSTEMS:  A comprehensive review of systems was negative.   PHYSICAL EXAMINATION: General appearance: alert, cooperative and no distress Head: Normocephalic, without obvious abnormality, atraumatic Neck: no adenopathy, no JVD, supple, symmetrical, trachea midline and thyroid not enlarged, symmetric, no tenderness/mass/nodules Lymph nodes: Cervical, supraclavicular, and axillary nodes normal. Resp: clear to auscultation bilaterally Back: symmetric, no curvature. ROM normal. No CVA tenderness. Cardio: regular rate and rhythm, S1, S2 normal, no murmur, click, rub or gallop GI: soft, non-tender; bowel sounds normal; no masses,  no organomegaly Extremities: extremities normal, atraumatic, no cyanosis or edema  ECOG PERFORMANCE STATUS: 1 - Symptomatic but completely ambulatory  Blood pressure (!) 158/81, pulse 86, temperature 98.2 F (36.8 C), temperature source Oral, resp. rate 20, height 5' (1.524 m), weight 105 lb 12.8 oz (48 kg), SpO2 99 %.  LABORATORY DATA: Lab Results  Component Value Date   WBC 4.4 03/10/2019   HGB 9.9 (L) 03/10/2019   HCT 32.0 (L) 03/10/2019   MCV 95.2 03/10/2019   PLT 257 03/10/2019      Chemistry      Component Value Date/Time   NA 137 03/10/2019 1052   NA 130 (L) 08/21/2016 1125   K 5.2 (H) 03/10/2019 1052   K 4.8 08/21/2016 1125   CL 104 03/10/2019 1052   CO2 26 03/10/2019 1052   CO2 26 08/21/2016 1125   BUN 33 (H) 03/10/2019 1052   BUN 25.6 08/21/2016 1125   CREATININE 1.47 (H) 03/10/2019 1052   CREATININE 1.26 (H) 12/28/2018  1139   CREATININE 1.2 (H) 08/21/2016 1125      Component Value Date/Time   CALCIUM 9.1 03/10/2019 1052   CALCIUM 10.0 08/21/2016 1125   ALKPHOS 54 03/10/2019 1052   ALKPHOS 54 08/21/2016 1125   AST 29 03/10/2019 1052   AST 27 08/21/2016 1125   ALT 16 03/10/2019 1052   ALT 13 08/21/2016 1125   BILITOT <0.2 (L) 03/10/2019 1052   BILITOT 0.31 08/21/2016 1125       RADIOGRAPHIC STUDIES: CT Chest W  Contrast  Result Date: 03/10/2019 CLINICAL DATA:  Non-small-cell lung cancer. Restaging. EXAM: CT CHEST WITH CONTRAST TECHNIQUE: Multidetector CT imaging of the chest was performed during intravenous contrast administration. CONTRAST:  59m OMNIPAQUE IOHEXOL 300 MG/ML  SOLN COMPARISON:  02/22/2018 FINDINGS: Cardiovascular: The heart size is normal. No substantial pericardial effusion. Atherosclerotic calcification is noted in the wall of the thoracic aorta. Mediastinum/Nodes: No mediastinal lymphadenopathy. There is no hilar lymphadenopathy. The esophagus has normal imaging features. Moderate to large hiatal hernia. There is no axillary lymphadenopathy. Lungs/Pleura: Centrilobular emphsyema noted. Surgical changes noted right apex and right parahilar region. Peripheral, basilar predominant interstitial add patchy alveolar opacity is progressive since prior. No pleural effusion. 5 mm left upper lobe nodule on 36/5 is stable. Upper Abdomen: 6 mm hypervascular lesion in the posterior right liver (111/2) is stable in the interval. Cortical scarring noted in both kidneys. Musculoskeletal: No worrisome lytic or sclerotic osseous abnormality. Thoracolumbar scoliosis evident. IMPRESSION: 1. No new or progressive findings to suggest recurrent or metastatic disease in the chest. 2. Stable postsurgical changes right hemithorax. 3. Stable 5 mm left upper lobe pulmonary nodule. 4. Moderate to large hiatal hernia. 5. Emphysema (ICD10-J43.9) and Aortic Atherosclerosis (ICD10-170.0) Electronically Signed   By: EMisty StanleyM.D.   On: 03/10/2019 13:29   ASSESSMENT AND PLAN:  This is a very pleasant 86years old white female with stage II a non-small cell lung cancer, adenocarcinoma with positive EGFR mutation in exon 21 status post right upper lobectomy as well as wedge resection of the right middle lobe in March 2015 and the patient declined adjuvant systemic chemotherapy. The patient has been on observation for the last 6 years and she is doing fine. She had repeat CT scan of the chest performed recently.  I personally and independently reviewed the scans and discussed the results with the patient today. Her scan showed no concerning findings for disease recurrence or metastasis. I recommended for her to continue on observation with repeat CT scan of the chest in 1 year. For the anemia, I recommended for the patient to continue on oral iron tablets few days a week in addition to the iron rich diet. For hypertension she was advised to take her blood pressure medication as prescribed and to consult with her primary care physician for adjustment of her medication if needed. She was advised to call immediately if she has any concerning symptoms in the interval. The patient voices understanding of current disease status and treatment options and is in agreement with the current care plan. All questions were answered. The patient knows to call the clinic with any problems, questions or concerns. We can certainly see the patient much sooner if necessary.  Disclaimer: This note was dictated with voice recognition software. Similar sounding words can inadvertently be transcribed and may not be corrected upon review.

## 2019-03-21 ENCOUNTER — Other Ambulatory Visit: Payer: Self-pay | Admitting: Cardiology

## 2019-03-21 NOTE — Progress Notes (Signed)
Office Visit Note  Patient: [name]             Date of Birth: 14-Dec-1930           MRN: [id number]             PCP: Darreld Mclean, MD Referring: Darreld Mclean, MD Visit Date: 03/29/2019 Occupation: _0 @  Subjective:  Discuss DEXA results   History of Present Illness: [name] is a 86 y.o. female with history of seronegative rheumatoid arthritis.  She is taking Arava 10 mg by mouth daily started on 10/30/2018.  She is tolerating arava without any side effects.  She presents today with pain in both shoulder joints.  She has been using biofreeze topically as needed for pain relief.  She states 2-3 weeks ago she started having left sided lower back pain.  She describes the pain as a burning sensation.  She states using a heating pad makes the burning sensation worse.  She continues to take hydrocodone and robaxin as needed for pain relief.  She had a DEXA on 01/28/19 and would like the discuss the results and treatment options. She denies any vertebral fractures.  She has a history of GERD and takes Omeprazole daily.    Activities of Daily Living:  Patient reports joint stiffness all day  Patient Reports nocturnal pain.  Difficulty dressing/grooming: Reports Difficulty climbing stairs: Reports Difficulty getting out of chair: Reports Difficulty using hands for taps, buttons, cutlery, and/or writing: Reports  Review of Systems  Constitutional: Negative for fatigue.  HENT: Positive for nose dryness. Negative for mouth sores and mouth dryness.   Eyes: Positive for dryness. Negative for pain and visual disturbance.  Respiratory: Negative for cough, hemoptysis, shortness of breath, wheezing and difficulty breathing.   Cardiovascular: Negative for chest pain, palpitations, hypertension and swelling in legs/feet.  Gastrointestinal: Negative for blood in stool, constipation and diarrhea.  Endocrine: Negative for increased urination.  Genitourinary: Negative for  difficulty urinating and painful urination.  Musculoskeletal: Positive for arthralgias, joint pain, joint swelling and morning stiffness. Negative for myalgias, muscle weakness, muscle tenderness and myalgias.  Skin: Negative for color change, pallor, rash, hair loss, nodules/bumps, skin tightness, ulcers and sensitivity to sunlight.  Allergic/Immunologic: Negative for susceptible to infections.  Neurological: Negative for dizziness, numbness, headaches and memory loss.  Hematological: Negative for swollen glands.  Psychiatric/Behavioral: Negative for depressed mood, confusion and sleep disturbance. The patient is not nervous/anxious.     PMFS History:  Patient Active Problem List   Diagnosis Date Noted  . Popliteal artery occlusion, right ([place]) 04/10/2018  . HTN (hypertension) 04/10/2018  . Femoral artery pseudo-aneurysm, right ([place]) 04/09/2018  . Anemia of chronic disease 02/24/2018  . Unstable right hip arthroplasty 01/24/2018  . History of revision of total replacement of right hip joint 01/24/2018  . Hypovolemic shock ([place])   . Hyperkalemia   . Hyponatremia   . Failed total hip arthroplasty ([place]) 11/27/2017  . Status post revision of total hip 11/27/2017  . Elevated cholesterol 10/11/2015  . 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[place]) 10/04/2014  . Bilateral leg edema 08/01/2014  . Elevated BP 08/01/2014  . Rheumatoid arthritis involving multiple joints ([place]) 05/30/2014  . Status post total replacement of right hip 04/28/2014  . Long-term use of high-risk medication 11/11/2013  . Symptomatic anemia 11/11/2013  . Neuropathic pain 07/07/2013  . Constipation due to pain medication 07/07/2013  . Protein-calorie malnutrition, severe ([place]) 06/08/2013  . Lung cancer, Right upper lobe 05/08/2013  .  Sciatica of right side 08/13/2011  . Osteoporosis 03/07/2010  . PARESTHESIA 03/07/2010  . CT, CHEST, ABNORMAL 12/18/2008  . ABNORMAL ECHOCARDIOGRAM 12/14/2008  . SJOGREN'S SYNDROME 11/29/2008    . HYPOGLYCEMIA 06/29/2006  . RAYNAUD'S DISEASE 06/29/2006    Past Medical History:  Diagnosis Date  . Arthralgia of multiple joints    followed by dr ziolkowska  . Arthritis   . Cardiomyopathy (HCC)   . Chronic constipation   . Chronic inflammatory arthritis    rhemotolgist-  dr a. ziolkowska ([facility])  . Dry eyes    eye drops used   . GERD (gastroesophageal reflux disease)   . H/O discoid lupus erythematosus   . Hiatal hernia   . History of colon polyps   . Hypothyroidism   . Iron deficiency anemia   . LBBB (left bundle branch block) 2010  . Mitchell's disease (erythromelalgia) (HCC)    neurologist-  dr patel  . Nocturia   . Non-small cell cancer of right lung (HCC) surgeon-- dr gerhardt/  oncologist-  dr mohamed--- per lov notes no recurrence/   11-18-2017 per pt denies any symptoms   dx 2015--  Stage IIA (T2b,N0,M0) , +EGFR  mutation in exon 21, non-small cell adenocarcinoma right upper lobe---  s/p  Right upper lobectomy , right middley wedge resection and node dissection---  no chemo or radiation therapy  . OA (osteoarthritis)    hands  . Osteoporosis   . PONV (postoperative nausea and vomiting)    likes phenergan  . Raynaud's phenomenon 1965  . Renal insufficiency   . Rheumatoid arthritis (HCC)   . Sciatica   . Scoliosis   . Sjogren's syndrome (HCC)     Family History  Problem Relation Age of Onset  . Coronary artery disease Father   . Colon cancer Father   . Diabetes Father   . Cancer Father        colon  . Other Mother 94       MVA  . Healthy Sister   . Healthy Brother   . Healthy Daughter   . Hypothyroidism Daughter   . Other Brother        killed in war  . Pneumonia Sister   . Healthy Daughter   . Esophageal cancer Neg Hx   . Kidney disease Neg Hx   . Liver disease Neg Hx    Past Surgical History:  Procedure Laterality Date  . ANTERIOR HIP REVISION Right 11/27/2017   Procedure: RIGHT HIP ACETABULAR REVISION;  Surgeon: Blackman,  Christopher Y, MD;  Location: [facility];  Service: Orthopedics;  Laterality: Right;  . ANTERIOR HIP REVISION Right 01/24/2018   Procedure: OPEN REDUCTION OF DISLOCATED ANTERIOR HIP WITH REVISION OF LINER AND HIP BALL;  Surgeon: Blackman, Christopher Y, MD;  Location: [facility];  Service: Orthopedics;  Laterality: Right;  . APPENDECTOMY  1950s  . BIOPSY  04/14/2018   Procedure: BIOPSY;  Surgeon: Armbruster, Steven P, MD;  Location: [facility];  Service: Gastroenterology;;  . BIOPSY  04/16/2018   Procedure: BIOPSY;  Surgeon: Mansouraty, Gabriel Jr., MD;  [facility]: [facility];  Service: Gastroenterology;;  . CARDIOVASCULAR STRESS TEST  12/2008    mild fixed basal to mid septal perfusion defect felt likely due to artifact from LBBB, no ischemia, EF 58%  . COLONOSCOPY    . COLONOSCOPY WITH PROPOFOL N/A 04/16/2018   Procedure: COLONOSCOPY WITH PROPOFOL;  Surgeon: Mansouraty, Gabriel Jr., MD;  [facility]: [facility];  Service: Gastroenterology;  Laterality: N/A;  .   ESOPHAGOGASTRODUODENOSCOPY (EGD) WITH PROPOFOL N/A 04/14/2018   Procedure: ESOPHAGOGASTRODUODENOSCOPY (EGD) WITH PROPOFOL;  Surgeon: Armbruster, Steven P, MD;  [facility];  Service: Gastroenterology;  Laterality: N/A;  . FEMORAL-POPLITEAL BYPASS GRAFT Right 04/10/2018   Procedure: REPAIR RIGHT FEMORAL ARTERY PSEUDOANEURYSM, RETROPERITONEAL EXPOSURE OF ILIAC ARTERY, RIGHT POPLITEAL EMBOLECTOMY;  Surgeon: Dickson, Christopher S, MD;  Location: [facility];  Service: Vascular;  Laterality: Right;  . LYMPH NODE DISSECTION Right 06/07/2013   Procedure: LYMPH NODE DISSECTION;  Surgeon: Edward B Gerhardt, MD;  [facility]: [facility];  Service: Thoracic;  Laterality: Right;  . PATCH ANGIOPLASTY Right 04/10/2018   Procedure: PATCH  ANGIOPLASTY OF RIGHT FEMORAL ARTERY USING BOVINE PATCH, PATCH ANGIOPLASTY OF RIGHT POPLITEAL ARTERY USING BOVINE PATCH;  Surgeon: Dickson, Christopher S, MD;  [facility]: [facility];  Service: Vascular;  Laterality: Right;  . THORACIC  SYMPATHETECTOMY  1965   "large incision from chest to up to shoulder, the nerves were tied together, for raynaud's  . THORACOTOMY  06/07/2013   Procedure: MINI/LIMITED THORACOTOMY; right middle lobe wedge resection;  Surgeon: Edward B Gerhardt, MD;  [facility];  Service: Thoracic;;  . TONSILLECTOMY  child  . TOTAL ABDOMINAL HYSTERECTOMY  1980's    W/ BSO  . TOTAL HIP ARTHROPLASTY Right 04/28/2014   Procedure: RIGHT TOTAL HIP ARTHROPLASTY ANTERIOR APPROACH;  Surgeon: Christopher Y Blackman, MD;  [facility]: [facility];  Service: Orthopedics;  Laterality: Right;  . TRANSTHORACIC ECHOCARDIOGRAM  12/11/2008   ef 45-50%, grade 1 diastolic dysfunction/  mild LAE/  mild AR and MR/  trivial TR  . VIDEO ASSISTED THORACOSCOPY (VATS)/WEDGE RESECTION Right 06/07/2013   Procedure: VIDEO ASSISTED THORACOSCOPY (VATS)/right upper lobectomy, On Q;  Surgeon: Edward B Gerhardt, MD;  [facility];  Service: Thoracic;  Laterality: Right;  . VIDEO BRONCHOSCOPY N/A 06/07/2013   Procedure: VIDEO BRONCHOSCOPY;  Surgeon: Edward B Gerhardt, MD;  [facility];  Service: Thoracic;  Laterality: N/A;  . VIDEO BRONCHOSCOPY WITH ENDOBRONCHIAL NAVIGATION N/A 05/04/2013   Procedure: VIDEO BRONCHOSCOPY WITH ENDOBRONCHIAL NAVIGATION;  Surgeon: Edward B Gerhardt, MD;  [facility];  Service: Thoracic;  Laterality: N/A;   Social History   Social History Narrative   Lives with husband, daughter and grandchild local.   Highest level of education:  masters in education admin and technology   Immunization History  Administered Date(s) Administered  . Fluad Quad(high Dose 65+) 11/03/2018  . Influenza Split 11/21/2011  . Influenza Whole 11/29/2007, 11/29/2008, 11/29/2009  . Influenza, High Dose Seasonal PF 10/30/2012, 01/02/2015  . Influenza,inj,Quad PF,6+ Mos 11/22/2013, 11/14/2015  . Influenza-Unspecified 11/13/2016, 11/12/2017  . Pneumococcal Conjugate-13 05/22/2015  . Pneumococcal Polysaccharide-23 06/13/2013  . Tdap  08/17/2017  . Zoster 01/27/2014     Objective: Vital Signs: BP (!) 146/79 (BP Location: Left Arm, Patient Position: Sitting, Cuff Size: Normal)   Pulse 84   Resp 15   Ht 5' (1.524 m)   Wt 106 lb (48.1 kg)   BMI 20.70 kg/m    Physical Exam Vitals and nursing note reviewed.  Constitutional:      Appearance: She is well-developed.  HENT:     Head: Normocephalic and atraumatic.  Eyes:     Conjunctiva/sclera: Conjunctivae normal.  Cardiovascular:     Rate and Rhythm: Normal rate and regular rhythm.     Heart sounds: Normal heart sounds.  Pulmonary:     Effort: Pulmonary effort is normal.     Breath sounds: Normal breath sounds.  Abdominal:     General: Bowel sounds are   normal.     Palpations: Abdomen is soft.  Musculoskeletal:     Cervical back: Normal range of motion.  Lymphadenopathy:     Cervical: No cervical adenopathy.  Skin:    General: Skin is warm and dry.     Capillary Refill: Capillary refill takes less than 2 seconds.  Neurological:     Mental Status: She is alert and oriented to person, place, and time.  Psychiatric:        Behavior: Behavior normal.      Musculoskeletal Exam: C-spine limited ROM. Severe thoracolumbar scoliosis. Bilateral houlder joints have limited abduction to about 90 degrees.  Painful and limited internal rotation of both shoulder joints.  Elbow joints good ROM with no tenderness or inflammation. Limited ROM Subluxation of all DIP joints.  PIP, DIP, and MCP joint synovial thickening.  Tenderness of bilateral 3rd MCP joints.  Knee joints good ROM with no warmth or effusion.  Ankle joints good ROM with no tenderness or inflammation.   CDAI Exam: CDAI Score: 2.3  Patient Global: 8 mm; Provider Global: 5 mm Swollen: 0 ; Tender: 1  Joint Exam 03/29/2019      Right  Left  MCP 3   Tender        Investigation: No additional findings.  Imaging: CT Chest W Contrast  Result Date: 03/10/2019 CLINICAL DATA:  Non-small-cell lung cancer.  Restaging. EXAM: CT CHEST WITH CONTRAST TECHNIQUE: Multidetector CT imaging of the chest was performed during intravenous contrast administration. CONTRAST:  60mL OMNIPAQUE IOHEXOL 300 MG/ML  SOLN COMPARISON:  02/22/2018 FINDINGS: Cardiovascular: The heart size is normal. No substantial pericardial effusion. Atherosclerotic calcification is noted in the wall of the thoracic aorta. Mediastinum/Nodes: No mediastinal lymphadenopathy. There is no hilar lymphadenopathy. The esophagus has normal imaging features. Moderate to large hiatal hernia. There is no axillary lymphadenopathy. Lungs/Pleura: Centrilobular emphsyema noted. Surgical changes noted right apex and right parahilar region. Peripheral, basilar predominant interstitial add patchy alveolar opacity is progressive since prior. No pleural effusion. 5 mm left upper lobe nodule on 36/5 is stable. Upper Abdomen: 6 mm hypervascular lesion in the posterior right liver (111/2) is stable in the interval. Cortical scarring noted in both kidneys. Musculoskeletal: No worrisome lytic or sclerotic osseous abnormality. Thoracolumbar scoliosis evident. IMPRESSION: 1. No new or progressive findings to suggest recurrent or metastatic disease in the chest. 2. Stable postsurgical changes right hemithorax. 3. Stable 5 mm left upper lobe pulmonary nodule. 4. Moderate to large hiatal hernia. 5. Emphysema (ICD10-J43.9) and Aortic Atherosclerosis (ICD10-170.0) Electronically Signed   By: Eric  Mansell M.D.   On: 03/10/2019 13:29    Recent Labs: Lab Results  Component Value Date   WBC 4.4 03/10/2019   HGB 9.9 (L) 03/10/2019   PLT 257 03/10/2019   NA 137 03/10/2019   K 5.2 (H) 03/10/2019   CL 104 03/10/2019   CO2 26 03/10/2019   GLUCOSE 84 03/10/2019   BUN 33 (H) 03/10/2019   CREATININE 1.47 (H) 03/10/2019   BILITOT <0.2 (L) 03/10/2019   ALKPHOS 54 03/10/2019   AST 29 03/10/2019   ALT 16 03/10/2019   PROT 6.9 03/10/2019   ALBUMIN 3.5 03/10/2019   CALCIUM 9.1  03/10/2019   GFRAA 37 (L) 03/10/2019    Speciality Comments: No specialty comments available.  Procedures:  No procedures performed Allergies: Amlodipine, Prochlorperazine edisylate, Aspirin, Cymbalta [duloxetine hcl], and Pamelor [nortriptyline hcl]   Assessment / Plan:     Visit Diagnoses: Rheumatoid arthritis of multiple sites with negative rheumatoid factor (  [place]) - RF negative, anti-CCP negative: She has no active synovitis on exam.  She does have tenderness of bilateral first MCP joints.  She has synovial thickening of all MCPs, PIPs, DIP joints.  She has painful limited range of motion of bilateral shoulder joints.  She has been taking Arava 10 mg 1 tablet by mouth daily which was started on 10/30/2018.  She has been tolerating Arava without any side effects.  We will increase the dose of Arava to 20 mg 1 tablet alternating with half tablet every other day.  A new prescription for Reglan sent to the pharmacy today.  She is advised to notify us if she cannot tolerate the increased dose.  She will return for lab work 2 weeks after increasing the dose to monitor for drug toxicity.  She will follow-up in the office in 3 months.  High risk medication use - Arava 10 mg daily started on 10/30/2018.  She will increase the dose of Arava to 20 mg 1 tablet alternating with 1/2 tablet by mouth every other day.  A new prescription for ray will be sent to the pharmacy.  CBC and CMP were drawn on 03/10/2019.  She will return for lab work in 2 weeks after increasing the dose of Arava to monitor for drug toxicity. Discontinued Plaquenil several years ago due to ocular toxicity.  She had an inadequate response to methotrexate in the past.  Rheumatoid arthritis involving both hands with negative rheumatoid factor ([place]): She has synovial thickening of all MCP joints.  She has tenderness of bilateral third MCP joints.  She has chronic pain and stiffness in both hands.  Rheumatoid arthritis involving both shoulders  with negative rheumatoid factor ([place]): Chronic pain.  She is limited abduction to about 90 degrees bilaterally.  She is having painful and limited internal rotation of both shoulder joints.  She has been experiencing increased pain in both shoulders for the past 6 months.  She has been using Biofreeze topically as needed for pain relief.  Trigger finger, right middle finger: Resolved   Trigger finger, right ring finger: Resolved   Sicca syndrome ([place]): She has chronic sicca symptoms.  Age-related osteoporosis without current pathological fracture: DEXA 01/28/2019: The BMD measured at Femur Neck is 0.636 g/cm2 with a T-score of -2.9. The right hip was not scanned due to a hip replacement. Compared with the prior study on, 11/05/2011 the BMD of the total mean shows a statistically significant decrease.  We reviewed the DEXA results and all questions were addressed.  She has significant height loss likely due to thoracolumbar scoliosis.  She has no known vertebral fractures.  She has not been in any treatment for osteoporosis in the past.  She has a history of GERD and takes omeprazole on a daily basis.  We discussed that she would not be a good candidate for Fosamax due to her underlying history of reflux.  Her GFR was 32 on 03/10/2019 so she would not be a good candidate for Fosamax or Reclast.  We discussed starting her on Prolia 60 mg subcutaneous injections once every 6 months.  Indications, contraindications, potential side effects of Prolia were discussed.  All questions were addressed.  We will apply for Prolia sq injections through her insurance.   Height loss: She has severe thoracolumbar scoliosis.   Other idiopathic scoliosis, thoracolumbar region: Severe.  Chronic pain.  She was advised to follow-up with her spine specialist.  Failure of right total hip arthroplasty, subsequent encounter: Dr. Ninfa Linden. Chronic  pain   Status post total replacement of right hip: Dr. Ninfa Linden  Anemia of  chronic disease: Hemoglobin 9.9 and hematocrit 32.0 on 03/10/2019.  She is followed by Dr. Julien Nordmann.  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  Meds ordered this encounter  Medications  . leflunomide (ARAVA) 20 MG tablet    Sig: Take 1 tablet (20 mg total) alternating with half tablet (10 mg total) by mouth every other day.    Dispense:  30 tablet    Refill:  2    Face-to-face time spent with patient was 30 minutes. Greater than 50% of time was spent in counseling and coordination of care.  Follow-Up Instructions: Return in about 3 months (around 06/26/2019) for Rheumatoid arthritis, Osteoporosis.   Hazel Sams, PA-C  I examined and evaluated the patient with Hazel Sams PA.  Patient continues to have some discomfort in her joints and have mild synovitis in her hands.  We discussed increasing the dose of leflunomide 20 mg tablet, 1 tablet alternating with half tablet every other day.  We will see response to that.  We will check labs in 2 weeks.  He also had detailed discussion regarding osteoporosis and treatment options.  We will apply for Prolia.  The plan of care was discussed as noted above.  Bo Merino, MD  Note - This record has been created using Editor, commissioning.  Chart creation errors have been sought, but may not always  have been located. Such creation errors do not reflect on  the standard of medical care.

## 2019-03-22 ENCOUNTER — Telehealth: Payer: Self-pay | Admitting: Gastroenterology

## 2019-03-22 NOTE — Telephone Encounter (Signed)
Colonoscopy was completed by Dr. Rush Landmark as inpatient in 04/2018 and n/f internal hemorrhoids and small hemorrhoids. Otherwise, normal appearing colon. By her description, suspect bleeding hemorrhoids. Agree with conservative management as currently doing and can set up for OV (would actually be my first time meeting this patient) for exam and discussion of possible hemorrhoid band ligation to better treat bothersome hemorrhoids.

## 2019-03-22 NOTE — Telephone Encounter (Signed)
Called and spoke with patient-patient reports she has been having (for the last 2-3 weeks) minimal rectal bleeding w/o having a bowel movement- not profuse bleeding but enough to be acknowledged; blood on toilet paper, on stool with bowel movements; reports no straining; having loose bowel movements; using hemorrhoidal cream and suppositories, using flush-able wipes to clean self; using a heating pad for comfort; feels like there is something (polyp) on the outside of her rectum (using suppositories to "shrink it";  Additional information=MD (protocologist she knows) previously informed her she has a "spastic colon"- Please advise of next step-

## 2019-03-22 NOTE — Telephone Encounter (Signed)
Rx has been sent to the pharmacy electronically. ° °

## 2019-03-22 NOTE — Telephone Encounter (Signed)
Called and spoke with patient-patient given recommendations from MD and is agreeable to plan of care; patient has been scheduled for an OV with Dr. Bryan Lemma on 03/25/2019 at 10:40 am; Patient advised to call back to the office at [phone] should questions/concerns arise; Patient verbalized understanding of information/instructions;

## 2019-03-25 ENCOUNTER — Ambulatory Visit: Payer: Medicare PPO | Admitting: Gastroenterology

## 2019-03-25 ENCOUNTER — Other Ambulatory Visit: Payer: Self-pay

## 2019-03-25 ENCOUNTER — Encounter: Payer: Self-pay | Admitting: Gastroenterology

## 2019-03-25 VITALS — BP 146/70 | HR 88 | Temp 97.5°F | Ht 60.0 in | Wt 105.1 lb

## 2019-03-25 DIAGNOSIS — K5900 Constipation, unspecified: Secondary | ICD-10-CM

## 2019-03-25 DIAGNOSIS — K649 Unspecified hemorrhoids: Secondary | ICD-10-CM

## 2019-03-25 DIAGNOSIS — K921 Melena: Secondary | ICD-10-CM

## 2019-03-25 DIAGNOSIS — K219 Gastro-esophageal reflux disease without esophagitis: Secondary | ICD-10-CM | POA: Diagnosis not present

## 2019-03-25 NOTE — Patient Instructions (Signed)
If you are age 84 or older, your body mass index should be between 23-30. Your Body mass index is 20.53 kg/m. If this is out of the aforementioned range listed, please consider follow up with your Primary Care Provider.  If you are age 23 or younger, your body mass index should be between 19-25. Your Body mass index is 20.53 kg/m. If this is out of the aformentioned range listed, please consider follow up with your Primary Care Provider.    It was a pleasure to see you today!  Vito Cirigliano, D.O.

## 2019-03-25 NOTE — Progress Notes (Signed)
P  Chief Complaint:    Change in bowel habits, hematochezia, hemorrhoids  HPI:    Patient is a 86 y.o. female with a history of GERD, Sjogren's syndrome, RA, Raynaud's, chronic constipation, discoid lupus, LBBB, Mitchell's disease, non-small cell cancer of right lung in 2015, OA, presenting to the Gastroenterology Clinic for evaluation of symptomatic hemorrhoids.  Having small-volume hematochezia described as BRB on tissue paper and on stool.  Worse with straining to have BM.  Has been using hemorrhoid supp, hemorrhoid cream, along with flushable wipes for cleaning. Imrpovement in hematochezia with hemorrhoid meds.  Feels like something is "constricting the rectum" which makes having a BM difficult. Has been treating with Miralax to keep stools soft. Otherwise, will have straining to have BM, which exacerbates sxs. Has had constipation/straining for entire life. Takes stools softener, probiotics, high fiber diet. No fiber supplement.   Was admitted in 03/2018 with hematochezia.  Evaluated with EGD/colonoscopy as outlined below.  Reviewed most recent labs from 02/2019: H/H 9.9/32 (baseline hemoglobin ~11), normal WBC, PLT.  MCV slightly up from baseline at 95 (baseline ~93).  Creatinine 1.47 (up from baseline), normal liver enzymes.  Iron studies in 05/2018: Ferritin 850, iron 91, sat 33%.  Iron studies reordered last month-not yet done.  Continues to follow in the Hematology/Oncology clinic given history of lung CA.  Not taking oral iron tablets as prescribed, but maintains iron rich diet.  Recommended restarting iron tablets a few days per week.  No concern for malignant recurrence by CT.  History of reflux, well controlled with Prilosec 20 mg/day.  Will occasionally take a second dose prior to known exacerbating foods (i.e. tomato based sauces).  No dysphagia.  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  Follows in the Rheumatology clinic,  last seen 10/2018.  Endoscopic History: -Colonoscopy (04/2018, Dr. Rush Landmark): Grade 2 internal hemorrhoids, 3 small tubular adenomas -EGD (03/2018, Dr. Havery Moros): 7 cm HH, tortuous esophagus, localized nodularity at [place] (biopsy: H. pylori gastritis-treated as inpatient), Normal stomach/duodenum  Review of systems:     No chest pain, no SOB, no fevers, no urinary sx   Past Medical History:  Diagnosis Date  . Arthralgia of multiple joints    followed by dr Gerilyn Nestle  . Arthritis   . Cardiomyopathy ([place])   . Chronic constipation   . Chronic inflammatory arthritis    rhemotolgist-  dr a. Gerilyn Nestle [facility])  . Dry eyes    eye drops used   . GERD (gastroesophageal reflux disease)   . H/O discoid lupus erythematosus   . Hiatal hernia   . History of colon polyps   . Hypothyroidism   . Iron deficiency anemia   . LBBB (left bundle branch block) 2010  . Mitchell's disease (erythromelalgia) [facility])    neurologist-  dr patel  . Nocturia   . Non-small cell cancer of right lung [facility]) surgeon-- dr gerhardt/  oncologist-  dr Julien Nordmann--- per lov notes no recurrence/   11-18-2017 per pt denies any symptoms   dx 2015--  Stage IIA (T2b,N0,M0) , +EGFR  mutation in exon 21, non-small cell adenocarcinoma right upper lobe---  s/p  Right upper lobectomy , right middley wedge resection and node dissection---  no chemo or radiation therapy  . OA (osteoarthritis)    hands  . Osteoporosis   . PONV (postoperative nausea and vomiting)    likes phenergan  . Raynaud's phenomenon 1965  . Renal insufficiency   .  Rheumatoid arthritis ([place])   . Sciatica   . Scoliosis   . Sjogren's syndrome ([place])     Patient's surgical history, family medical history, social history, medications and allergies were all reviewed in Epic    Current Outpatient Medications  Medication Sig Dispense Refill  . Acetaminophen (TYLENOL EXTRA STRENGTH PO) Take 2 tablets by mouth every 6 (six) hours as needed.    . Biotin 1000  MCG tablet Take 1,000 mcg by mouth daily.     . Calcium Carbonate-Vitamin D (CALCIUM 500 + D) 500-125 MG-UNIT TABS Take 1 tablet by mouth daily.     . carvedilol (COREG) 3.125 MG tablet TAKE 1 TABLET (3.125 MG TOTAL) BY MOUTH 2 (TWO) TIMES DAILY. 180 tablet 0  . Diclofenac Sodium 3 % GEL Apply a pea sized amount to affected joint twice daily as needed. 100 g 2  . docusate sodium (STOOL SOFTENER) 100 MG capsule Take 100 mg by mouth 2 (two) times daily.      Marland Kitchen gabapentin (NEURONTIN) 300 MG capsule TAKE 300MG IN THE MORNING, 300MG IN THE AFTERNOON, AND 600MG AT BEDTIME. 360 capsule 1  . Glucosamine-Chondroit-Vit C-Mn (GLUCOSAMINE CHONDR 1500 COMPLX PO) Take 1 capsule by mouth daily.     Marland Kitchen HYDROcodone-acetaminophen (NORCO/VICODIN) 5-325 MG tablet Take 1 tablet by mouth every 4 (four) hours as needed for severe pain. 40 tablet 0  . leflunomide (ARAVA) 10 MG tablet TAKE 1 TABLET BY MOUTH EVERY DAY 90 tablet 0  . levothyroxine (SYNTHROID) 88 MCG tablet TAKE 1 TABLET (88 MCG TOTAL) BY MOUTH DAILY BEFORE BREAKFAST. 90 tablet 1  . methocarbamol (ROBAXIN) 500 MG tablet Take 1 tablet (500 mg total) by mouth every 6 (six) hours as needed for muscle spasms. 40 tablet 0  . metoprolol succinate (TOPROL XL) 25 MG 24 hr tablet Take 0.5 tablets (12.5 mg total) by mouth daily. 45 tablet 3  . Multiple Vitamin (MULTIVITAMIN) tablet Take 1 tablet by mouth daily.      Marland Kitchen omeprazole (PRILOSEC) 20 MG capsule TAKE 1 CAPSULE BY MOUTH 2 TIMES DAILY BEFORE A MEAL. 180 capsule 1  . Probiotic Product (PROBIOTIC PO) Take by mouth daily.    Marland Kitchen PROVENTIL HFA 108 (90 Base) MCG/ACT inhaler 2 PUFFS EVERY 6 HOURS AS NEEDED 6.7 g 2  . senna-docusate (SENOKOT-S) 8.6-50 MG tablet Take 1 tablet by mouth at bedtime as needed for mild constipation. 30 tablet 0  . spironolactone (ALDACTONE) 25 MG tablet Take 12.5 mg by mouth every morning.   2   No current facility-administered medications for this visit.    Physical Exam:     BP (!)  146/70   Pulse 88   Temp (!) 97.5 F (36.4 C)   Ht 5' (1.524 m)   Wt 105 lb 2 oz (47.7 kg)   BMI 20.53 kg/m   GENERAL:  Pleasant female in NAD PSYCH: : Cooperative, normal affect ABDOMEN:  Nondistended, soft, nontender. No obvious masses, no hepatomegaly,  normal bowel sounds SKIN:  turgor, no lesions seen Musculoskeletal:  Normal muscle tone, normal strength NEURO: Alert and oriented x 3, no focal neurologic deficits Rectal exam: Sensation intact and preserved anal wink. No external anal fissures, external hemorrhoids or skin tags. Normal sphincter tone. Grade 2 hemorhoids noted in all positions on anoscopy. (Chaperone: Curlene Labrum, CMA).     IMPRESSION and PLAN:    1) Symptomatic Internal Hemorrhoids 2) Hematochezia  PROCEDURE NOTE: The patient presents with symptomatic grade 2 hemorrhoids, unresponsive to maximal medical therapy,  requesting rubber band ligation of symptomatic hemorrhoidal disease.  All risks, benefits and alternative forms of therapy were described and informed consent was obtained.  In the Left Lateral Decubitus position, anoscopic examination revealed grade 2 hemorrhoids in the all position(s).  The anorectum was pre-medicated with RectiCare. The decision was made to band the RA internal hemorrhoid, and the [place] was used to perform band ligation without complication.  Digital anorectal examination was then performed to assure proper positioning of the band, and to adjust the banded tissue as required.  The patient was discharged home without pain or other issues.  Dietary and behavioral recommendations were given and along with follow-up instructions.     -Return in 2-4 for  follow-up and possible additional banding as required. No complications were encountered and the patient tolerated the procedure well.  3) Constipation: - Resume Miralax, stools softerner, and high fiber diet - Suspect multifactorial in nature, to include underlying  Sjogrens, scoliosis -Resume increased fluid intake  4) GERD 5) Hiatal hernia - Well controlled on current regimen- Prilosec 20 mg daily with a 2nd dose taken prn prior to known exacerbating foods (ie, tomato based sauce)  6) Anemia -Repeat iron panel as previously ordered in the Hematology clinic -Resume oral iron as tolerated   I spent 25 minutes of additional time (not including separate procedure time as outlined above), including independent review of results as outlined above, communicating results with the patient directly, face-to-face time with the patient, coordinating care, ordering studies and medications as appropriate, and documentation.         Lavena Bullion ,DO, FACG 03/25/2019, 10:47 AM

## 2019-03-26 ENCOUNTER — Encounter: Payer: Self-pay | Admitting: Family Medicine

## 2019-03-28 NOTE — Addendum Note (Signed)
Addended by: Lamar Blinks C on: 03/28/2019 05:34 AM   Modules accepted: Orders

## 2019-03-29 ENCOUNTER — Telehealth: Payer: Self-pay | Admitting: Pharmacist

## 2019-03-29 ENCOUNTER — Other Ambulatory Visit: Payer: Self-pay

## 2019-03-29 ENCOUNTER — Ambulatory Visit: Payer: Medicare Other | Admitting: Rheumatology

## 2019-03-29 ENCOUNTER — Encounter: Payer: Self-pay | Admitting: Rheumatology

## 2019-03-29 VITALS — BP 146/79 | HR 84 | Resp 15 | Ht 60.0 in | Wt 106.0 lb

## 2019-03-29 DIAGNOSIS — M06011 Rheumatoid arthritis without rheumatoid factor, right shoulder: Secondary | ICD-10-CM

## 2019-03-29 DIAGNOSIS — M65331 Trigger finger, right middle finger: Secondary | ICD-10-CM | POA: Diagnosis not present

## 2019-03-29 DIAGNOSIS — E270 Other adrenocortical overactivity: Secondary | ICD-10-CM

## 2019-03-29 DIAGNOSIS — M06042 Rheumatoid arthritis without rheumatoid factor, left hand: Secondary | ICD-10-CM

## 2019-03-29 DIAGNOSIS — M810 Age-related osteoporosis without current pathological fracture: Secondary | ICD-10-CM

## 2019-03-29 DIAGNOSIS — C3411 Malignant neoplasm of upper lobe, right bronchus or lung: Secondary | ICD-10-CM

## 2019-03-29 DIAGNOSIS — R29890 Loss of height: Secondary | ICD-10-CM | POA: Diagnosis not present

## 2019-03-29 DIAGNOSIS — M06012 Rheumatoid arthritis without rheumatoid factor, left shoulder: Secondary | ICD-10-CM

## 2019-03-29 DIAGNOSIS — M3500 Sicca syndrome, unspecified: Secondary | ICD-10-CM

## 2019-03-29 DIAGNOSIS — M06041 Rheumatoid arthritis without rheumatoid factor, right hand: Secondary | ICD-10-CM | POA: Diagnosis not present

## 2019-03-29 DIAGNOSIS — M0609 Rheumatoid arthritis without rheumatoid factor, multiple sites: Secondary | ICD-10-CM | POA: Diagnosis not present

## 2019-03-29 DIAGNOSIS — Z79899 Other long term (current) drug therapy: Secondary | ICD-10-CM | POA: Diagnosis not present

## 2019-03-29 DIAGNOSIS — M65341 Trigger finger, right ring finger: Secondary | ICD-10-CM | POA: Diagnosis not present

## 2019-03-29 DIAGNOSIS — M4125 Other idiopathic scoliosis, thoracolumbar region: Secondary | ICD-10-CM

## 2019-03-29 DIAGNOSIS — I10 Essential (primary) hypertension: Secondary | ICD-10-CM

## 2019-03-29 DIAGNOSIS — D638 Anemia in other chronic diseases classified elsewhere: Secondary | ICD-10-CM

## 2019-03-29 DIAGNOSIS — T84010D Broken internal right hip prosthesis, subsequent encounter: Secondary | ICD-10-CM

## 2019-03-29 DIAGNOSIS — Z96641 Presence of right artificial hip joint: Secondary | ICD-10-CM

## 2019-03-29 MED ORDER — LEFLUNOMIDE 20 MG PO TABS: ORAL_TABLET | ORAL | 2 refills | Status: DC

## 2019-03-29 NOTE — Patient Instructions (Signed)
Standing Labs We placed an order today for your standing lab work.    Please come back and get your standing labs in 2 weeks  We have open lab daily Monday through Thursday from 8:30-12:30 PM and 1:30-4:30 PM and Friday from 8:30-12:30 PM and 1:30 -4:00 PM at the office of Dr. Shaili Deveshwar.   You may experience shorter wait times on Monday and Friday afternoons. The office is located at [address], [address], [place], [place] [address] No appointment is necessary.   Labs are drawn by [facility].  You may receive a bill from [facility] for your lab work.  If you wish to have your labs drawn at another location, please call the office 24 hours in advance to send orders.  If you have any questions regarding directions or hours of operation,  please call [phone].   Just as a reminder please drink plenty of water prior to coming for your lab work. Thanks!   

## 2019-03-29 NOTE — Telephone Encounter (Signed)
Please start BIV for Prolia. T-score -2.9 at left femur neck.  She has GERD so not a good candidate for oral bisphosphonate.  Naive to treatment per notes.   Mariella Saa, PharmD, [place], [place] Clinical Specialty Pharmacist [phone]  03/29/2019 11:29 AM

## 2019-03-29 NOTE — Telephone Encounter (Signed)
Submitted a Prior Authorization request to [employer] for PROLIA via Cover My Meds. Will update once we receive a response.   

## 2019-03-29 NOTE — Progress Notes (Signed)
Pharmacy Note  Subjective:  Patient presents today to [facility] for follow up office visit.   Patient was seen by the pharmacist for counseling on Prolia. She is naive to osteoporosis treatment.    Objective: CMP     Component Value Date/Time   NA 137 03/10/2019 1052   NA 130 (L) 08/21/2016 1125   K 5.2 (H) 03/10/2019 1052   K 4.8 08/21/2016 1125   CL 104 03/10/2019 1052   CO2 26 03/10/2019 1052   CO2 26 08/21/2016 1125   GLUCOSE 84 03/10/2019 1052   GLUCOSE 82 08/21/2016 1125   GLUCOSE 85 09/22/2008 0000   BUN 33 (H) 03/10/2019 1052   BUN 25.6 08/21/2016 1125   CREATININE 1.47 (H) 03/10/2019 1052   CREATININE 1.26 (H) 12/28/2018 1139   CREATININE 1.2 (H) 08/21/2016 1125   CALCIUM 9.1 03/10/2019 1052   CALCIUM 10.0 08/21/2016 1125   PROT 6.9 03/10/2019 1052   PROT 7.4 08/21/2016 1125   ALBUMIN 3.5 03/10/2019 1052   ALBUMIN 4.0 08/21/2016 1125   AST 29 03/10/2019 1052   AST 27 08/21/2016 1125   ALT 16 03/10/2019 1052   ALT 13 08/21/2016 1125   ALKPHOS 54 03/10/2019 1052   ALKPHOS 54 08/21/2016 1125   BILITOT <0.2 (L) 03/10/2019 1052   BILITOT 0.31 08/21/2016 1125   GFRNONAA 32 (L) 03/10/2019 1052   GFRNONAA 39 (L) 12/28/2018 1139   GFRAA 37 (L) 03/10/2019 1052   GFRAA 45 (L) 12/28/2018 1139    Vitamin D Lab Results  Component Value Date   VD25OH 40.13 07/02/2016    T-score -2.9 at left femur neck on 01/28/2019 with a -9 % change in BMD  Assessment/Plan:  Counseled patient on purpose, proper use, and adverse effects of Prolia.  Counseled patient that Prolia is a medication that must be injected every 6 months by a healthcare professional.  Advised patient to take calcium 1200 mg daily and vitamin D 800 units daily.  She is currently taking Centrum Silver for Women daily and eats 2 servings of dairy daily.   Reviewed the most common adverse effects of Prolia including risk of infection, osteonecrosis of the jaw, rash, and muscle/bone pain.  Patient  confirms she does not have any major dental work planned at this time.  Reviewed with patient the signs/symptoms of low calcium and advised patient to alert [place] if she experiences these symptoms.  Provided patient with medication education material and answered all questions. Will apply for Prolia through patient's insurance.  All questions encouraged and answered.  Instructed patient to call with any questions or concerns.  Mariella Saa, PharmD, [place], [place] Clinical Specialty Pharmacist [phone]  03/29/2019 11:46 AM

## 2019-03-30 ENCOUNTER — Other Ambulatory Visit: Payer: Self-pay | Admitting: Cardiology

## 2019-03-30 ENCOUNTER — Telehealth: Payer: Self-pay | Admitting: Rheumatology

## 2019-03-30 NOTE — Telephone Encounter (Signed)
Received notification from [facility] -  regarding a prior authorization for Center. Authorization has been APPROVED from 03/29/19 to 02/17/20 through her Medicare Part B benefit. So patient will need to receive injection in hospital outpatient setting.  Will send document to scan center.  Authorization #  [id number] Phone # [phone]  Will call to review medical benefit  8:28 AM Beatriz Chancellor, CPhT

## 2019-03-30 NOTE — Telephone Encounter (Signed)
Patient states she picked up her prescription of the [place] from the pharmacy. Patient states the 20 mg of Arava is triangular shape and unable to be cut. Patient states she has a new bottle of the Arava 10 mg. Patient would like to know if she can just alternate the 10 mg tablets with the 20 mg and if we can send the 10 mg tablets when she runs out.

## 2019-03-30 NOTE — Telephone Encounter (Signed)
We can make the dose adjustment after we see the lab work.  She will get labs in 2 weeks and we can make that determination.

## 2019-03-30 NOTE — Telephone Encounter (Signed)
Called [employer] to verify Prolia [phone] medical benefits. Patient has $0.00 deductible and a max out of pocket of $3,840. Prolia has a $0.00 copay. Patient could be charged for administration fees, depending on how the facility bills.  Ref# [id number] RepLouie Casa  Phone# [phone]  Called patient to advise, no answer or voicemail.  3:44 PM Beatriz Chancellor, CPhT

## 2019-03-30 NOTE — Telephone Encounter (Signed)
Patient called stating Dr. Estanislado Pandy sent a new prescription of Arava 20 mg to [facility].  Patient states Dr. Estanislado Pandy told her to alternate the medication by taking 1 20 mg tablet one day and then cutting the tablet in half and taking 10 mg the next day.  Patient states when she picked up the prescription the pill is shaped like a triangle and there is no line to cut the pill in half.  Patient states she still has her old prescription Arava 10 mg which she could take.  Patient is requesting a return call.

## 2019-03-31 ENCOUNTER — Encounter: Payer: Self-pay | Admitting: *Deleted

## 2019-03-31 ENCOUNTER — Other Ambulatory Visit: Payer: Self-pay | Admitting: *Deleted

## 2019-03-31 DIAGNOSIS — M810 Age-related osteoporosis without current pathological fracture: Secondary | ICD-10-CM

## 2019-03-31 NOTE — Telephone Encounter (Signed)
Patient advised about Prolia injection. Patient is due for lab work in 2 weeks and will schedule Prolia injection for 10 days after the lab work. Patient will return 10 days after injection for blood work. Order placed. Patient provided with number to [facility] to schedule.

## 2019-03-31 NOTE — Telephone Encounter (Signed)
Patient advised we can make the dose adjustment after we see the lab work.  She will get labs in 2 weeks and we can make that determination. Patient verbalized understanding.

## 2019-04-05 ENCOUNTER — Encounter: Payer: Self-pay | Admitting: Family Medicine

## 2019-04-05 ENCOUNTER — Telehealth: Payer: Self-pay | Admitting: Rheumatology

## 2019-04-05 NOTE — Telephone Encounter (Signed)
Patient is scheduled to have COVID vaccine on Friday. Patient is scheduled to have infusion in 2-3 weeks, and wants to make sure vaccine will not interfere with infusion. Please call to advise.

## 2019-04-05 NOTE — Telephone Encounter (Signed)
Advised patient that the COVID-19 vaccine will not interfere with the prolia injection in a couple of weeks. Patient verbalized understanding.

## 2019-04-06 ENCOUNTER — Other Ambulatory Visit (HOSPITAL_COMMUNITY): Payer: Self-pay | Admitting: General Practice

## 2019-04-06 ENCOUNTER — Telehealth: Payer: Self-pay | Admitting: Rheumatology

## 2019-04-06 NOTE — Telephone Encounter (Signed)
Scheduled patient for a Prolia injection at [facility] on 04/22/19 at 10:00 am. Patient advised.

## 2019-04-06 NOTE — Telephone Encounter (Signed)
Spoke with patient and confirmed phone number she has called. Advised patient orders for Prolia were placed on 03/31/19. Patient will contact the infusion center again to schedule. Patient advised to call the office back if she has any trouble and I will call to make the appointment.

## 2019-04-06 NOTE — Telephone Encounter (Signed)
Patient left a voicemail stating she isn't sure which one of the nurses she talked to who set up an appointment for her to go to [facility] for her Prolia injection.  Patient states she talked to 3 people at [facility] this morning and no one seems to know anything about it.  Patient is requesting a return call.

## 2019-04-12 ENCOUNTER — Other Ambulatory Visit: Payer: Self-pay

## 2019-04-12 DIAGNOSIS — Z79899 Other long term (current) drug therapy: Secondary | ICD-10-CM

## 2019-04-13 LAB — COMPLETE METABOLIC PANEL WITH GFR
AG Ratio: 1.4 (calc) (ref 1.0–2.5)
ALT: 10 U/L (ref 6–29)
AST: 25 U/L (ref 10–35)
Albumin: 4.1 g/dL (ref 3.6–5.1)
Alkaline phosphatase (APISO): 44 U/L (ref 37–153)
BUN/Creatinine Ratio: 24 (calc) — ABNORMAL HIGH (ref 6–22)
BUN: 36 mg/dL — ABNORMAL HIGH (ref 7–25)
CO2: 24 mmol/L (ref 20–32)
Calcium: 9.4 mg/dL (ref 8.6–10.4)
Chloride: 100 mmol/L (ref 98–110)
Creat: 1.5 mg/dL — ABNORMAL HIGH (ref 0.60–0.88)
GFR, Est African American: 36 mL/min/{1.73_m2} — ABNORMAL LOW (ref >=60)
GFR, Est Non African American: 31 mL/min/{1.73_m2} — ABNORMAL LOW (ref >=60)
Globulin: 2.9 g/dL (calc) (ref 1.9–3.7)
Glucose, Bld: 101 mg/dL — ABNORMAL HIGH (ref 65–99)
Potassium: 5.3 mmol/L (ref 3.5–5.3)
Sodium: 135 mmol/L (ref 135–146)
Total Bilirubin: 0.3 mg/dL (ref 0.2–1.2)
Total Protein: 7 g/dL (ref 6.1–8.1)

## 2019-04-13 LAB — CBC WITH DIFFERENTIAL/PLATELET
Absolute Monocytes: 467 cells/uL (ref 200–950)
Basophils Absolute: 29 cells/uL (ref 0–200)
Basophils Relative: 0.9 %
Eosinophils Absolute: 48 cells/uL (ref 15–500)
Eosinophils Relative: 1.5 %
HCT: 32.8 % — ABNORMAL LOW (ref 35.0–45.0)
Hemoglobin: 10.6 g/dL — ABNORMAL LOW (ref 11.7–15.5)
Lymphs Abs: 762 cells/uL — ABNORMAL LOW (ref 850–3900)
MCH: 29.3 pg (ref 27.0–33.0)
MCHC: 32.3 g/dL (ref 32.0–36.0)
MCV: 90.6 fL (ref 80.0–100.0)
MPV: 10.9 fL (ref 7.5–12.5)
Monocytes Relative: 14.6 %
Neutro Abs: 1894 cells/uL (ref 1500–7800)
Neutrophils Relative %: 59.2 %
Platelets: 177 10*3/uL (ref 140–400)
RBC: 3.62 10*6/uL — ABNORMAL LOW (ref 3.80–5.10)
RDW: 12.9 % (ref 11.0–15.0)
Total Lymphocyte: 23.8 %
WBC: 3.2 10*3/uL — ABNORMAL LOW (ref 3.8–10.8)

## 2019-04-13 NOTE — Progress Notes (Signed)
Decrease in white cell count noted.  I would recommend decreasing Arava back to 10 mg p.o. daily.  Arava dose was recently increased.

## 2019-04-14 ENCOUNTER — Other Ambulatory Visit: Payer: Self-pay | Admitting: *Deleted

## 2019-04-14 MED ORDER — LEFLUNOMIDE 20 MG PO TABS: ORAL_TABLET | ORAL | 2 refills | Status: DC

## 2019-04-14 MED ORDER — LEFLUNOMIDE 10 MG PO TABS: 10.0000 mg | ORAL_TABLET | Freq: Every day | ORAL | 2 refills | Status: DC

## 2019-04-19 ENCOUNTER — Other Ambulatory Visit: Payer: Self-pay | Admitting: Rheumatology

## 2019-04-19 NOTE — Telephone Encounter (Signed)
Last Visit: 03/29/19 Next Visit: 06/28/19 Labs: 04/12/19  Decrease in white cell count noted. recommend decreasing Arava back to 10 mg p.o. daily  Okay to refill per Dr. Estanislado Pandy

## 2019-04-20 ENCOUNTER — Ambulatory Visit: Payer: Medicare PPO | Admitting: Gastroenterology

## 2019-04-21 ENCOUNTER — Encounter: Payer: Self-pay | Admitting: Family Medicine

## 2019-04-22 ENCOUNTER — Encounter (HOSPITAL_COMMUNITY)
Admission: RE | Admit: 2019-04-22 | Discharge: 2019-04-22 | Disposition: A | Discharge status: Home / Self Care | Payer: Medicare PPO | Source: Ambulatory Visit | Attending: Rheumatology | Admitting: Rheumatology

## 2019-04-22 ENCOUNTER — Other Ambulatory Visit: Payer: Self-pay

## 2019-04-22 DIAGNOSIS — M810 Age-related osteoporosis without current pathological fracture: Secondary | ICD-10-CM | POA: Insufficient documentation

## 2019-04-22 MED ORDER — DENOSUMAB 60 MG/ML ~~LOC~~ SOSY
60.0000 mg | PREFILLED_SYRINGE | Freq: Once | SUBCUTANEOUS | Status: AC
  Administered 2019-04-22: 60 mg via SUBCUTANEOUS

## 2019-04-22 MED ORDER — DENOSUMAB 60 MG/ML ~~LOC~~ SOSY
PREFILLED_SYRINGE | SUBCUTANEOUS | Status: AC
  Filled 2019-04-22: qty 1

## 2019-04-22 NOTE — Discharge Instructions (Signed)
Denosumab injection What is this medicine? DENOSUMAB (den oh sue mab) slows bone breakdown. Prolia is used to treat osteoporosis in women after menopause and in men, and in people who are taking corticosteroids for 6 months or more. Xgeva is used to treat a high calcium level due to cancer and to prevent bone fractures and other bone problems caused by multiple myeloma or cancer bone metastases. Xgeva is also used to treat giant cell tumor of the bone. This medicine may be used for other purposes; ask your health care provider or pharmacist if you have questions. COMMON BRAND NAME(S): Prolia, XGEVA What should I tell my health care provider before I take this medicine? They need to know if you have any of these conditions:  dental disease  having surgery or tooth extraction  infection  kidney disease  low levels of calcium or Vitamin D in the blood  malnutrition  on hemodialysis  skin conditions or sensitivity  thyroid or parathyroid disease  an unusual reaction to denosumab, other medicines, foods, dyes, or preservatives  pregnant or trying to get pregnant  breast-feeding How should I use this medicine? This medicine is for injection under the skin. It is given by a health care professional in a hospital or clinic setting. A special MedGuide will be given to you before each treatment. Be sure to read this information carefully each time. For Prolia, talk to your pediatrician regarding the use of this medicine in children. Special care may be needed. For Xgeva, talk to your pediatrician regarding the use of this medicine in children. While this drug may be prescribed for children as young as 13 years for selected conditions, precautions do apply. Overdosage: If you think you have taken too much of this medicine contact a poison control center or emergency room at once. NOTE: This medicine is only for you. Do not share this medicine with others. What if I miss a dose? It is  important not to miss your dose. Call your doctor or health care professional if you are unable to keep an appointment. What may interact with this medicine? Do not take this medicine with any of the following medications:  other medicines containing denosumab This medicine may also interact with the following medications:  medicines that lower your chance of fighting infection  steroid medicines like prednisone or cortisone This list may not describe all possible interactions. Give your health care provider a list of all the medicines, herbs, non-prescription drugs, or dietary supplements you use. Also tell them if you smoke, drink alcohol, or use illegal drugs. Some items may interact with your medicine. What should I watch for while using this medicine? Visit your doctor or health care professional for regular checks on your progress. Your doctor or health care professional may order blood tests and other tests to see how you are doing. Call your doctor or health care professional for advice if you get a fever, chills or sore throat, or other symptoms of a cold or flu. Do not treat yourself. This drug may decrease your body's ability to fight infection. Try to avoid being around people who are sick. You should make sure you get enough calcium and vitamin D while you are taking this medicine, unless your doctor tells you not to. Discuss the foods you eat and the vitamins you take with your health care professional. See your dentist regularly. Brush and floss your teeth as directed. Before you have any dental work done, tell your dentist you are   receiving this medicine. Do not become pregnant while taking this medicine or for 5 months after stopping it. Talk with your doctor or health care professional about your birth control options while taking this medicine. Women should inform their doctor if they wish to become pregnant or think they might be pregnant. There is a potential for serious side  effects to an unborn child. Talk to your health care professional or pharmacist for more information. What side effects may I notice from receiving this medicine? Side effects that you should report to your doctor or health care professional as soon as possible:  allergic reactions like skin rash, itching or hives, swelling of the face, lips, or tongue  bone pain  breathing problems  dizziness  jaw pain, especially after dental work  redness, blistering, peeling of the skin  signs and symptoms of infection like fever or chills; cough; sore throat; pain or trouble passing urine  signs of low calcium like fast heartbeat, muscle cramps or muscle pain; pain, tingling, numbness in the hands or feet; seizures  unusual bleeding or bruising  unusually weak or tired Side effects that usually do not require medical attention (report to your doctor or health care professional if they continue or are bothersome):  constipation  diarrhea  headache  joint pain  loss of appetite  muscle pain  runny nose  tiredness  upset stomach This list may not describe all possible side effects. Call your doctor for medical advice about side effects. You may report side effects to FDA at 1-800-FDA-1088. Where should I keep my medicine? This medicine is only given in a clinic, doctor's office, or other health care setting and will not be stored at home. NOTE: This sheet is a summary. It may not cover all possible information. If you have questions about this medicine, talk to your doctor, pharmacist, or health care provider.  2020 Elsevier/Gold Standard (2017-06-12 16:10:44)

## 2019-04-26 ENCOUNTER — Encounter: Payer: Self-pay | Admitting: Gastroenterology

## 2019-04-26 ENCOUNTER — Ambulatory Visit: Payer: Medicare PPO | Admitting: Gastroenterology

## 2019-04-26 ENCOUNTER — Other Ambulatory Visit: Payer: Self-pay

## 2019-04-26 VITALS — BP 150/80 | HR 77 | Temp 97.1°F | Ht 60.0 in | Wt 104.1 lb

## 2019-04-26 DIAGNOSIS — K641 Second degree hemorrhoids: Secondary | ICD-10-CM

## 2019-04-26 DIAGNOSIS — K649 Unspecified hemorrhoids: Secondary | ICD-10-CM

## 2019-04-26 DIAGNOSIS — K219 Gastro-esophageal reflux disease without esophagitis: Secondary | ICD-10-CM

## 2019-04-26 DIAGNOSIS — K5900 Constipation, unspecified: Secondary | ICD-10-CM

## 2019-04-26 DIAGNOSIS — K449 Diaphragmatic hernia without obstruction or gangrene: Secondary | ICD-10-CM

## 2019-04-26 NOTE — Patient Instructions (Signed)
It was a pleasure to see you today!  Vito Cirigliano, D.O.

## 2019-04-26 NOTE — Progress Notes (Signed)
P  Chief Complaint:    Symptomatic Internal Hemorrhoids; Hemorrhoid Band Ligation  GI History: 86 y.o. female with a history of GERD, Sjogren's syndrome, RA, Raynaud's, chronic constipation, discoid lupus, LBBB, Mitchell's disease, non-small cell cancer of right lung in 2015, OA, follows in the GI clinic for symptomatic hemorrhoids (hematochezia, perianal irritation), along with GERD/hiatal hernia and constipation.  1) Hemorrhoids: Symptomatic hemorrhoids with intermittent small-volume medic easier, worse with straining to have BM.  Suboptimal response to hemorrhoid suppository, hemorrhoid cream, along with flushable wipes for cleaning after BM.  Admitted 2/thousand 20 with hematochezia with EGD/colonoscopy as outlined above.  -03/21/2019: Successful banding of the RA hemorrhoid -04/26/2019: Presents for hemorrhoid banding #2  2) GERD: Reflux symptoms well controlled Prilosec 20 mg/day. Will occasionally take a second dose prior to known exacerbating foods (i.e. tomato based sauces).  No dysphagia.  3) Constipation: Longstanding history of constipation/straining essentially for as long as she can remember.  Take stool softener, probiotics, high-fiber diet along with MiraLAX as needed to keep stools soft.  Endoscopic History: -Colonoscopy (04/2018, Dr. Rush Landmark): Grade 2 internal hemorrhoids, 3 small tubular adenomas -EGD (03/2018, Dr. Havery Moros): 7 cm HH, tortuous esophagus, localized nodularity at [place] (biopsy: H. 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  Follows in the Rheumatology clinic, last seen 10/2018.  HPI:     Patient is a 86 y.o.  femalewith a history of symptomatic internal hemorrhoids presenting to the Gastroenterology Clinic for follow-up and ongoing treatment. The patient presents with symptomatic grade 2 hemorrhoids, unresponsive to maximal medical therapy,  requesting rubber band ligation of symptomatic hemorrhoidal disease.  No issues after first banding.  Has essentially had resolution of hematochezia after just 1 banding.  Rectal irritation much improved.  Presents today for banding #2.  No change in medical or surgical history, medications, allergies, social history since last appointment with me.   Review of systems:     No chest pain, no SOB, no fevers, no urinary sx   Past Medical History:  Diagnosis Date  . Arthralgia of multiple joints    followed by dr Gerilyn Nestle  . Arthritis   . Cardiomyopathy ([place])   . Chronic constipation   . Chronic inflammatory arthritis    rhemotolgist-  dr a. Gerilyn Nestle [facility])  . Dry eyes    eye drops used   . GERD (gastroesophageal reflux disease)   . H/O discoid lupus erythematosus   . Hiatal hernia   . History of colon polyps   . Hypothyroidism   . Iron deficiency anemia   . LBBB (left bundle branch block) 2010  . Mitchell's disease (erythromelalgia) [facility])    neurologist-  dr patel  . Nocturia   . Non-small cell cancer of right lung [facility]) surgeon-- dr gerhardt/  oncologist-  dr Julien Nordmann--- per lov notes no recurrence/   11-18-2017 per pt denies any symptoms   dx 2015--  Stage IIA (T2b,N0,M0) , +EGFR  mutation in exon 21, non-small cell adenocarcinoma right upper lobe---  s/p  Right upper lobectomy , right middley wedge resection and node dissection---  no chemo or radiation therapy  . OA (osteoarthritis)    hands  . Osteoporosis   . PONV (postoperative nausea and vomiting)    likes phenergan  . Raynaud's phenomenon 1965  . Renal insufficiency   . Rheumatoid arthritis ([place])   . Sciatica   . Scoliosis   . Sjogren's syndrome ([place])  Patient's surgical history, family medical history, social history, medications and allergies were all reviewed in Epic    Current Outpatient Medications  Medication Sig Dispense Refill  . Acetaminophen (TYLENOL EXTRA STRENGTH PO) Take 2 tablets  by mouth every 6 (six) hours as needed.    . Biotin 1000 MCG tablet Take 1,000 mcg by mouth daily.     . Calcium Carbonate-Vitamin D (CALCIUM 500 + D) 500-125 MG-UNIT TABS Take 1 tablet by mouth daily.     . carvedilol (COREG) 3.125 MG tablet TAKE 1 TABLET (3.125 MG TOTAL) BY MOUTH 2 (TWO) TIMES DAILY. 180 tablet 0  . denosumab (PROLIA) 60 MG/ML SOSY injection Inject 60 mg into the skin every 6 (six) months. Last injection 04-22-2019    . Diclofenac Sodium 3 % GEL Apply a pea sized amount to affected joint twice daily as needed. 100 g 2  . docusate sodium (STOOL SOFTENER) 100 MG capsule Take 100 mg by mouth 2 (two) times daily.      Marland Kitchen gabapentin (NEURONTIN) 300 MG capsule TAKE '300MG'$  IN THE MORNING, '300MG'$  IN THE AFTERNOON, AND '600MG'$  AT BEDTIME. 360 capsule 1  . Glucosamine-Chondroit-Vit C-Mn (GLUCOSAMINE CHONDR 1500 COMPLX PO) Take 1 capsule by mouth daily.     Marland Kitchen HYDROcodone-acetaminophen (NORCO/VICODIN) 5-325 MG tablet Take 1 tablet by mouth every 4 (four) hours as needed for severe pain. 40 tablet 0  . leflunomide (ARAVA) 10 MG tablet TAKE 1 TABLET BY MOUTH EVERY DAY 90 tablet 0  . levothyroxine (SYNTHROID) 88 MCG tablet TAKE 1 TABLET (88 MCG TOTAL) BY MOUTH DAILY BEFORE BREAKFAST. 90 tablet 1  . methocarbamol (ROBAXIN) 500 MG tablet Take 1 tablet (500 mg total) by mouth every 6 (six) hours as needed for muscle spasms. 40 tablet 0  . Multiple Vitamin (MULTIVITAMIN) tablet Take 1 tablet by mouth daily.      Marland Kitchen omeprazole (PRILOSEC) 20 MG capsule TAKE 1 CAPSULE BY MOUTH 2 TIMES DAILY BEFORE A MEAL. 180 capsule 1  . Probiotic Product (PROBIOTIC PO) Take by mouth daily.     No current facility-administered medications for this visit.    Physical Exam:     BP (!) 150/80   Pulse 77   Temp (!) 97.1 F (36.2 C)   Ht 5' (1.524 m)   Wt 104 lb 2 oz (47.2 kg)   BMI 20.34 kg/m   GENERAL:  Pleasant female in NAD PSYCH: : Cooperative, normal affect Rectal exam: Sensation intact and preserved anal  wink.  Grade 2 hemorrhoids noted in LL and RP positions.  No external anal fissures noted. Normal sphincter tone. No palpable mass. No blood on the exam glove. (Chaperone: Curlene Labrum, CMA).   IMPRESSION and PLAN:    1) Symptomatic internal hemorrhoids: 2) Hematochezia PROCEDURE NOTE: The patient presents with symptomatic grade 2 hemorrhoids, unresponsive to maximal medical therapy, requesting rubber band ligation of symptomatic hemorrhoidal disease.  All risks, benefits and alternative forms of therapy were described and informed consent was obtained.  In the Left Lateral Decubitus position, anoscopic examination revealed grade 2 hemorrhoids in the RP and LL position(s).  The anorectum was pre-medicated with RectiCare. The decision was made to band the LL internal hemorrhoid, and the [place] was used to perform band ligation without complication.  Digital anorectal examination was then performed to assure proper positioning of the band, and to adjust the banded tissue as required.  The patient was discharged home without pain or other issues.  Dietary and behavioral recommendations were  given and along with follow-up instructions.     The following adjunctive treatments were recommended:  -Resume high-fiber diet with fiber supplement (i.e. Citrucel or Benefiber) with goal for soft stools without straining to have a BM. -Resume adequate fluid intake.  The patient will return in 2-4 for  follow-up and possible additional banding as required. No complications were encountered and the patient tolerated the procedure well.  3) Constipation: -Improved.  Resume Miralax, stools softerner, and high fiber diet - Suspect multifactorial in nature, to include underlying Sjogrens, scoliosis -Resume increased fluid intake  4) GERD 5) Hiatal hernia - Well controlled on current regimen- Prilosec 20 mg daily with a 2nd dose taken prn prior to known exacerbating foods (ie, tomato based  sauce)  6) Anemia -H/H largely stable at 10.6/32.8 in 03/2019. -Repeat iron panel as previously ordered in the Hematology clinic -Resume oral iron as tolerated      [place] ,DO, FACG 04/26/2019, 11:28 AM

## 2019-05-03 ENCOUNTER — Other Ambulatory Visit: Payer: Self-pay

## 2019-05-03 DIAGNOSIS — Z79899 Other long term (current) drug therapy: Secondary | ICD-10-CM | POA: Diagnosis not present

## 2019-05-04 LAB — CBC WITH DIFFERENTIAL/PLATELET
Absolute Monocytes: 500 cells/uL (ref 200–950)
Basophils Absolute: 31 cells/uL (ref 0–200)
Basophils Relative: 0.9 %
Eosinophils Absolute: 51 cells/uL (ref 15–500)
Eosinophils Relative: 1.5 %
HCT: 32.6 % — ABNORMAL LOW (ref 35.0–45.0)
Hemoglobin: 10.6 g/dL — ABNORMAL LOW (ref 11.7–15.5)
Lymphs Abs: 857 cells/uL (ref 850–3900)
MCH: 29.2 pg (ref 27.0–33.0)
MCHC: 32.5 g/dL (ref 32.0–36.0)
MCV: 89.8 fL (ref 80.0–100.0)
MPV: 10.9 fL (ref 7.5–12.5)
Monocytes Relative: 14.7 %
Neutro Abs: 1962 cells/uL (ref 1500–7800)
Neutrophils Relative %: 57.7 %
Platelets: 168 10*3/uL (ref 140–400)
RBC: 3.63 10*6/uL — ABNORMAL LOW (ref 3.80–5.10)
RDW: 12.4 % (ref 11.0–15.0)
Total Lymphocyte: 25.2 %
WBC: 3.4 10*3/uL — ABNORMAL LOW (ref 3.8–10.8)

## 2019-05-04 LAB — COMPLETE METABOLIC PANEL WITH GFR
AG Ratio: 1.5 (calc) (ref 1.0–2.5)
ALT: 12 U/L (ref 6–29)
AST: 26 U/L (ref 10–35)
Albumin: 4 g/dL (ref 3.6–5.1)
Alkaline phosphatase (APISO): 42 U/L (ref 37–153)
BUN/Creatinine Ratio: 24 (calc) — ABNORMAL HIGH (ref 6–22)
BUN: 26 mg/dL — ABNORMAL HIGH (ref 7–25)
CO2: 25 mmol/L (ref 20–32)
Calcium: 9 mg/dL (ref 8.6–10.4)
Chloride: 104 mmol/L (ref 98–110)
Creat: 1.08 mg/dL — ABNORMAL HIGH (ref 0.60–0.88)
GFR, Est African American: 54 mL/min/{1.73_m2} — ABNORMAL LOW (ref >=60)
GFR, Est Non African American: 46 mL/min/{1.73_m2} — ABNORMAL LOW (ref >=60)
Globulin: 2.6 g/dL (calc) (ref 1.9–3.7)
Glucose, Bld: 71 mg/dL (ref 65–99)
Potassium: 4.9 mmol/L (ref 3.5–5.3)
Sodium: 137 mmol/L (ref 135–146)
Total Bilirubin: 0.3 mg/dL (ref 0.2–1.2)
Total Protein: 6.6 g/dL (ref 6.1–8.1)

## 2019-05-04 NOTE — Progress Notes (Signed)
Creatinine is high but improved.  White cell count is low and stable.  Anemia persistent stable.

## 2019-05-08 ENCOUNTER — Other Ambulatory Visit: Payer: Self-pay | Admitting: Family Medicine

## 2019-05-12 ENCOUNTER — Ambulatory Visit: Payer: Medicare PPO | Admitting: Gastroenterology

## 2019-05-12 NOTE — Progress Notes (Signed)
HPI: FU CHF. Nuclear study 2010 showed ejection fraction 58% with fixed septal defect related to left bundle branch block and no ischemia.  Echocardiogram January 2020 showed ejection fraction 40 to 71%, grade 1 diastolic dysfunction, mild aortic insufficiency and mild to moderate mitral regurgitation. Echocardiogram February 2020 showed normal LV systolic function grade 1 diastolic dysfunction, mild left atrial enlargement, mild aortic insufficiency. Since  Last seen, she notes palpitations at night lasting 2 minutes at a time.  There is associated mild dyspnea but no chest pain or syncope.  She otherwise denies dyspnea on exertion, orthopnea, PND, pedal edema, exertional chest pain or syncope.  Current Outpatient Medications  Medication Sig Dispense Refill  . Acetaminophen (TYLENOL EXTRA STRENGTH PO) Take 2 tablets by mouth every 6 (six) hours as needed.    . Biotin 1000 MCG tablet Take 1,000 mcg by mouth daily.     . Calcium Carbonate-Vitamin D (CALCIUM 500 + D) 500-125 MG-UNIT TABS Take 1 tablet by mouth daily.     . carvedilol (COREG) 3.125 MG tablet TAKE 1 TABLET (3.125 MG TOTAL) BY MOUTH 2 (TWO) TIMES DAILY. 180 tablet 0  . denosumab (PROLIA) 60 MG/ML SOSY injection Inject 60 mg into the skin every 6 (six) months. Last injection 04-22-2019    . Diclofenac Sodium 3 % GEL Apply a pea sized amount to affected joint twice daily as needed. 100 g 2  . docusate sodium (STOOL SOFTENER) 100 MG capsule Take 100 mg by mouth 2 (two) times daily.      Marland Kitchen gabapentin (NEURONTIN) 300 MG capsule TAKE '300MG'$  IN THE MORNING, '300MG'$  IN THE AFTERNOON, AND '600MG'$  AT BEDTIME. 360 capsule 1  . Glucosamine-Chondroit-Vit C-Mn (GLUCOSAMINE CHONDR 1500 COMPLX PO) Take 1 capsule by mouth daily.     Marland Kitchen HYDROcodone-acetaminophen (NORCO/VICODIN) 5-325 MG tablet Take 1 tablet by mouth every 4 (four) hours as needed for severe pain. 40 tablet 0  . leflunomide (ARAVA) 10 MG tablet TAKE 1 TABLET BY MOUTH EVERY DAY 90 tablet 0   . levothyroxine (SYNTHROID) 88 MCG tablet TAKE 1 TABLET (88 MCG TOTAL) BY MOUTH DAILY BEFORE BREAKFAST. 90 tablet 1  . methocarbamol (ROBAXIN) 500 MG tablet TAKE 1 TABLET (500 MG TOTAL) BY MOUTH EVERY 6 (SIX) HOURS AS NEEDED FOR MUSCLE SPASMS. 40 tablet 0  . Multiple Vitamin (MULTIVITAMIN) tablet Take 1 tablet by mouth daily.      Marland Kitchen omeprazole (PRILOSEC) 20 MG capsule TAKE 1 CAPSULE BY MOUTH 2 TIMES DAILY BEFORE A MEAL. 180 capsule 1  . Probiotic Product (PROBIOTIC PO) Take by mouth daily.     No current facility-administered medications for this visit.     Past Medical History:  Diagnosis Date  . Arthralgia of multiple joints    followed by dr Gerilyn Nestle  . Arthritis   . Cardiomyopathy ([place])   . Chronic constipation   . Chronic inflammatory arthritis    rhemotolgist-  dr a. Gerilyn Nestle [facility])  . Dry eyes    eye drops used   . GERD (gastroesophageal reflux disease)   . H/O discoid lupus erythematosus   . Hiatal hernia   . History of colon polyps   . Hypothyroidism   . Iron deficiency anemia   . LBBB (left bundle branch block) 2010  . Mitchell's disease (erythromelalgia) [facility])    neurologist-  dr patel  . Nocturia   . Non-small cell cancer of right lung [facility]) surgeon-- dr gerhardt/  oncologist-  dr Julien Nordmann--- per lov notes  no recurrence/   11-18-2017 per pt denies any symptoms   dx 2015--  Stage IIA (T2b,N0,M0) , +EGFR  mutation in exon 21, non-small cell adenocarcinoma right upper lobe---  s/p  Right upper lobectomy , right middley wedge resection and node dissection---  no chemo or radiation therapy  . OA (osteoarthritis)    hands  . Osteoporosis   . PONV (postoperative nausea and vomiting)    likes phenergan  . Raynaud's phenomenon 1965  . Renal insufficiency   . Rheumatoid arthritis ([place])   . Sciatica   . Scoliosis   . Sjogren's syndrome [facility])     Past Surgical History:  Procedure Laterality Date  . ANTERIOR HIP REVISION Right 11/27/2017   Procedure:  RIGHT HIP ACETABULAR REVISION;  Surgeon: [name], MD;  Location: [facility];  Service: Orthopedics;  Laterality: Right;  . ANTERIOR HIP REVISION Right 01/24/2018   Procedure: OPEN REDUCTION OF DISLOCATED ANTERIOR HIP WITH REVISION OF LINER AND HIP BALL;  Surgeon: [name], MD;  Location: [facility];  Service: Orthopedics;  Laterality: Right;  . APPENDECTOMY  1950s  . BIOPSY  04/14/2018   Procedure: BIOPSY;  Surgeon: Yetta Flock, MD;  Location: [place];  Service: Gastroenterology;;  . BIOPSY  04/16/2018   Procedure: BIOPSY;  Surgeon: Irving Copas., MD;  Location: [facility];  Service: Gastroenterology;;  . CARDIOVASCULAR STRESS TEST  12/2008    mild fixed basal to mid septal perfusion defect felt likely due to artifact from LBBB, no ischemia, EF 58%  . COLONOSCOPY    . COLONOSCOPY WITH PROPOFOL N/A 04/16/2018   Procedure: COLONOSCOPY WITH PROPOFOL;  Surgeon: Rush Landmark Telford Nab., MD;  Location: [place];  Service: Gastroenterology;  Laterality: N/A;  . ESOPHAGOGASTRODUODENOSCOPY (EGD) WITH PROPOFOL N/A 04/14/2018   Procedure: ESOPHAGOGASTRODUODENOSCOPY (EGD) WITH PROPOFOL;  Surgeon: Yetta Flock, MD;  Location: [place];  Service: Gastroenterology;  Laterality: N/A;  . FEMORAL-POPLITEAL BYPASS GRAFT Right 04/10/2018   Procedure: REPAIR RIGHT FEMORAL ARTERY PSEUDOANEURYSM, RETROPERITONEAL EXPOSURE OF ILIAC ARTERY, RIGHT POPLITEAL EMBOLECTOMY;  Surgeon: Angelia Mould, MD;  Location: [place];  Service: Vascular;  Laterality: Right;  . LYMPH NODE DISSECTION Right 06/07/2013   Procedure: LYMPH NODE DISSECTION;  Surgeon: [name], MD;  Location: [place];  Service: Thoracic;  Laterality: Right;  . PATCH ANGIOPLASTY Right 04/10/2018   Procedure: PATCH  ANGIOPLASTY OF RIGHT FEMORAL ARTERY USING BOVINE PATCH, PATCH ANGIOPLASTY OF RIGHT POPLITEAL ARTERY USING BOVINE PATCH;  Surgeon: Angelia Mould, MD;  Location: [place];   Service: Vascular;  Laterality: Right;  . [place]   "large incision from chest to up to shoulder, the nerves were tied together, for raynaud's  . THORACOTOMY  06/07/2013   Procedure: MINI/LIMITED THORACOTOMY; right middle lobe wedge resection;  Surgeon: [name], MD;  Location: [place];  Service: Thoracic;;  . TONSILLECTOMY  child  . TOTAL ABDOMINAL HYSTERECTOMY  1980's    W/ BSO  . TOTAL HIP ARTHROPLASTY Right 04/28/2014   Procedure: RIGHT TOTAL HIP ARTHROPLASTY ANTERIOR APPROACH;  Surgeon: [name], MD;  [facility];  Service: Orthopedics;  Laterality: Right;  . TRANSTHORACIC ECHOCARDIOGRAM  12/11/2008   ef 88-87%, grade 1 diastolic dysfunction/  mild LAE/  mild AR and MR/  trivial TR  . VIDEO ASSISTED THORACOSCOPY (VATS)/WEDGE RESECTION Right 06/07/2013   Procedure: VIDEO ASSISTED THORACOSCOPY (VATS)/right upper lobectomy, On Q;  Surgeon: [name], MD;  Location: [place];  Service: Thoracic;  Laterality: Right;  Marland Kitchen VIDEO BRONCHOSCOPY N/A 06/07/2013   Procedure: VIDEO BRONCHOSCOPY;  Surgeon: [name], MD;  [facility];  Service: Thoracic;  Laterality: N/A;  . VIDEO BRONCHOSCOPY WITH ENDOBRONCHIAL NAVIGATION N/A 05/04/2013   Procedure: VIDEO BRONCHOSCOPY WITH ENDOBRONCHIAL NAVIGATION;  Surgeon: [name], MD;  Location: [place];  Service: Thoracic;  Laterality: N/A;    Social History   Socioeconomic History  . Marital status: Widowed    Spouse name: Not on file  . Number of children: 2  . Years of education: Not on file  . Highest education level: Not on file  Occupational History  . Occupation: n/a  Tobacco Use  . Smoking status: Never Smoker  . Smokeless tobacco: Never Used  Substance and Sexual Activity  . Alcohol use: Not Currently  . Drug use: Never  . Sexual activity: Not Currently    Birth control/protection: Surgical  Other Topics Concern  . Not on file  Social History Narrative   Lives with  husband, daughter and grandchild local.   Highest level of education:  masters in Materials engineer   Social Determinants of Health   Financial Resource Strain:   . Difficulty of Paying Living Expenses:   Food Insecurity:   . Worried About Charity fundraiser in the Last Year:   . Arboriculturist in the Last Year:   Transportation Needs:   . Film/video editor (Medical):   Marland Kitchen Lack of Transportation (Non-Medical):   Physical Activity:   . Days of Exercise per Week:   . Minutes of Exercise per Session:   Stress:   . Feeling of Stress :   Social Connections:   . Frequency of Communication with Friends and Family:   . Frequency of Social Gatherings with Friends and Family:   . Attends Religious Services:   . Active Member of Clubs or Organizations:   . Attends Archivist Meetings:   Marland Kitchen Marital Status:   Intimate Partner Violence:   . Fear of Current or Ex-Partner:   . Emotionally Abused:   Marland Kitchen Physically Abused:   . Sexually Abused:     Family History  Problem Relation Age of Onset  . Coronary artery disease Father   . Colon cancer Father   . Diabetes Father   . Cancer Father        colon  . Other Mother 39       MVA  . Healthy Sister   . Healthy Brother   . Healthy Daughter   . Hypothyroidism Daughter   . Other Brother        killed in war  . Pneumonia Sister   . Healthy Daughter   . Esophageal cancer Neg Hx   . Kidney disease Neg Hx   . Liver disease Neg Hx     ROS: no fevers or chills, productive cough, hemoptysis, dysphasia, odynophagia, melena, hematochezia, dysuria, hematuria, rash, seizure activity, orthopnea, PND, pedal edema, claudication. Remaining systems are negative.  Physical Exam: Well-developed well-nourished in no acute distress.  Skin is warm and dry.  HEENT is normal.  Neck is supple.  Chest is clear to auscultation with normal expansion.  Cardiovascular exam is regular rate and rhythm.  Abdominal exam nontender or  distended. No masses palpated. Extremities show no edema. neuro grossly intact  A/P  1 cardiomyopathy-previous LV function mildly reduced but most recent study shows normal LV function.  We will continue with carvedilol at present dose.  2 history of mild to moderate mitral regurgitation-patient will need follow-up echoes in the future.  3 left bundle branch block-noted on prior electrocardiogram.  4 palpitations-more prolonged and more frequent.  I will arrange an event monitor to further assess.  Kirk Ruths, MD

## 2019-05-13 ENCOUNTER — Encounter: Payer: Self-pay | Admitting: Rheumatology

## 2019-05-13 ENCOUNTER — Other Ambulatory Visit: Payer: Self-pay | Admitting: Orthopaedic Surgery

## 2019-05-18 ENCOUNTER — Encounter: Payer: Self-pay | Admitting: Cardiology

## 2019-05-18 ENCOUNTER — Telehealth: Payer: Self-pay | Admitting: Radiology

## 2019-05-18 ENCOUNTER — Other Ambulatory Visit: Payer: Self-pay

## 2019-05-18 ENCOUNTER — Ambulatory Visit: Payer: Medicare PPO | Admitting: Cardiology

## 2019-05-18 VITALS — BP 148/80 | HR 84 | Ht 60.0 in | Wt 103.8 lb

## 2019-05-18 DIAGNOSIS — I340 Nonrheumatic mitral (valve) insufficiency: Secondary | ICD-10-CM

## 2019-05-18 DIAGNOSIS — I420 Dilated cardiomyopathy: Secondary | ICD-10-CM | POA: Diagnosis not present

## 2019-05-18 DIAGNOSIS — R002 Palpitations: Secondary | ICD-10-CM | POA: Diagnosis not present

## 2019-05-18 NOTE — Telephone Encounter (Signed)
Enrolled patient for a 30 day Event monitor to be mailed to patients home.

## 2019-05-18 NOTE — Patient Instructions (Signed)
Medication Instructions:  NO CHANGE *If you need a refill on your cardiac medications before your next appointment, please call your pharmacy*   Lab Work: Your physician recommends that you HAVE LAB WORK TODAY If you have labs (blood work) drawn today and your tests are completely normal, you will receive your results only by: Marland Kitchen MyChart Message (if you have MyChart) OR . A paper copy in the mail If you have any lab test that is abnormal or we need to change your treatment, we will call you to review the results.   Testing/Procedures: Your physician has recommended that you wear a 30 DAY event monitor. Event monitors are medical devices that record the heart's electrical activity. Doctors most often [place] these monitors to diagnose arrhythmias. Arrhythmias are problems with the speed or rhythm of the heartbeat. The monitor is a small, portable device. You can wear one while you do your normal daily activities. This is usually used to diagnose what is causing palpitations/syncope (passing out).WILL BE MAILED    Follow-Up: At [facility], you and your health needs are our priority.  As part of our continuing mission to provide you with exceptional heart care, we have created designated Provider Care Teams.  These Care Teams include your primary Cardiologist (physician) and Advanced Practice Providers (APPs -  Physician Assistants and Nurse Practitioners) who all work together to provide you with the care you need, when you need it.  We recommend signing up for the patient portal called "MyChart".  Sign up information is provided on this After Visit Summary.  MyChart is used to connect with patients for Virtual Visits (Telemedicine).  Patients are able to view lab/test results, encounter notes, upcoming appointments, etc.  Non-urgent messages can be sent to your provider as well.   To learn more about what you can do with MyChart, go to [email or web address].    Your next appointment:   6  month(s)  The format for your next appointment:   Either In Person or Virtual  Provider:   Kirk Ruths MD

## 2019-05-19 ENCOUNTER — Encounter: Payer: Self-pay | Admitting: Family Medicine

## 2019-05-19 DIAGNOSIS — E039 Hypothyroidism, unspecified: Secondary | ICD-10-CM

## 2019-05-19 LAB — TSH: TSH: 0.21 u[IU]/mL — ABNORMAL LOW (ref 0.450–4.500)

## 2019-05-19 MED ORDER — LEVOTHYROXINE SODIUM 75 MCG PO TABS: 75.0000 ug | ORAL_TABLET | Freq: Every day | ORAL | 5 refills | Status: DC

## 2019-05-25 ENCOUNTER — Encounter (INDEPENDENT_AMBULATORY_CARE_PROVIDER_SITE_OTHER): Payer: Medicare PPO

## 2019-05-25 DIAGNOSIS — R002 Palpitations: Secondary | ICD-10-CM

## 2019-05-26 DIAGNOSIS — M5136 Other intervertebral disc degeneration, lumbar region: Secondary | ICD-10-CM | POA: Diagnosis not present

## 2019-05-26 DIAGNOSIS — Z681 Body mass index (BMI) 19 or less, adult: Secondary | ICD-10-CM | POA: Diagnosis not present

## 2019-05-26 DIAGNOSIS — I10 Essential (primary) hypertension: Secondary | ICD-10-CM | POA: Diagnosis not present

## 2019-05-26 DIAGNOSIS — M419 Scoliosis, unspecified: Secondary | ICD-10-CM | POA: Diagnosis not present

## 2019-05-31 ENCOUNTER — Ambulatory Visit: Payer: Medicare PPO | Admitting: Gastroenterology

## 2019-05-31 ENCOUNTER — Encounter: Payer: Self-pay | Admitting: Gastroenterology

## 2019-05-31 ENCOUNTER — Other Ambulatory Visit: Payer: Self-pay

## 2019-05-31 VITALS — BP 120/74 | HR 64 | Temp 97.3°F | Ht 60.0 in | Wt 106.0 lb

## 2019-05-31 DIAGNOSIS — K5900 Constipation, unspecified: Secondary | ICD-10-CM | POA: Diagnosis not present

## 2019-05-31 DIAGNOSIS — K641 Second degree hemorrhoids: Secondary | ICD-10-CM

## 2019-05-31 NOTE — Patient Instructions (Signed)
It was a pleasure to see you today!  Vito Cirigliano, D.O.

## 2019-05-31 NOTE — Progress Notes (Signed)
P  Chief Complaint:    Symptomatic Internal Hemorrhoids; Hemorrhoid Band Ligation  GI History: 86 y.o.femalewith a history of GERD, Sjogren's syndrome, RA, Raynaud's, chronic constipation, discoid lupus, LBBB, Mitchell's disease, non-small cell cancer of right lung in 2015, OA, follows in the GI clinic for symptomatic hemorrhoids (hematochezia, perianal irritation), along with GERD/hiatal hernia and constipation.  1) Hemorrhoids: Symptomatic hemorrhoids with intermittent small-volume hematochezia, worse with straining to have BM.  Suboptimal response to hemorrhoid suppository, hemorrhoid cream, along with flushable wipes for cleaning after BM.  Admitted 03/2018 with hematochezia with EGD/colonoscopy as outlined above.  -03/21/2019: Successful banding of the RA hemorrhoid -04/26/2019: Successful banding of the LL hemorrhoid -05/31/2019: Presents for hemorrhoid banding #3  2) GERD: Reflux symptoms well controlled Prilosec 20 mg/day. Will occasionally take a second dose prior to known exacerbating foods (i.e. tomato based sauces). No dysphagia.  3) Constipation: Longstanding history of constipation/straining essentially for as long as she can remember.  Take stool softener, probiotics, high-fiber diet along with MiraLAX as needed to keep stools soft.  Endoscopic History: -Colonoscopy (04/2018, Dr.Mansouraty): Grade 2 internal hemorrhoids, 3 small tubular adenomas -EGD (03/2018, Dr. Havery Moros): 7 cm HH, tortuous esophagus, localized nodularity at [place] (biopsy: H. 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  Follows in the Rheumatology clinic. Marland Kitchen  HPI:     Patient is a 86 y.o. femalewith a history of symptomatic internal hemorrhoids presenting to the Gastroenterology Clinic for follow-up and ongoing treatment. The patient presents with symptomatic grade 2 hemorrhoids,  unresponsive to maximal medical therapy, requesting rubber band ligation of symptomatic hemorrhoidal disease.  Has had good clinical response to hemorrhoid banding #1 continue, with essentially resolution of hematochezia after banding #1 and significant improvement in rectal irritation after banding #2.  She presents today for banding #3.  Did have some mild dyschezia with BM 2-3 days ago.  Symptoms have since resolved.  Was seen by Dr. Stanford Breed in the Cardiology clinic on 05/18/2019, with plan for event monitor for evaluation of palpitations.  Evaluated by her PCM, Dr. Lorelei Pont, with adjustment of Synthroid due to low TSH.  Labs 05/03/2019: H/H 10.6/32.6, MCV/RDW 89.8/12.4.  All stable from previous.  No change in medical or surgical history, medications, allergies, social history since last appointment with me.   Review of systems:     No chest pain, no SOB, no fevers, no urinary sx   Past Medical History:  Diagnosis Date  . Arthralgia of multiple joints    followed by dr Gerilyn Nestle  . Arthritis   . Cardiomyopathy ([place])   . Chronic constipation   . Chronic inflammatory arthritis    rhemotolgist-  dr a. Gerilyn Nestle [facility])  . Dry eyes    eye drops used   . GERD (gastroesophageal reflux disease)   . H/O discoid lupus erythematosus   . Hiatal hernia   . History of colon polyps   . Hypothyroidism   . Iron deficiency anemia   . LBBB (left bundle branch block) 2010  . Mitchell's disease (erythromelalgia) [facility])    neurologist-  dr patel  . Nocturia   . Non-small cell cancer of right lung [facility]) surgeon-- dr gerhardt/  oncologist-  dr Julien Nordmann--- per lov notes no recurrence/   11-18-2017 per pt denies any symptoms   dx 2015--  Stage IIA (T2b,N0,M0) , +EGFR  mutation in exon 21, non-small cell adenocarcinoma right upper lobe---  s/p  Right upper lobectomy , right middley wedge resection and node dissection---  no chemo or radiation therapy  . OA (osteoarthritis)    hands  .  Osteoporosis   . PONV (postoperative nausea and vomiting)    likes phenergan  . Raynaud's phenomenon 1965  . Renal insufficiency   . Rheumatoid arthritis ([place])   . Sciatica   . Scoliosis   . Sjogren's syndrome ([place])     Patient's surgical history, family medical history, social history, medications and allergies were all reviewed in Epic    Current Outpatient Medications  Medication Sig Dispense Refill  . Acetaminophen (TYLENOL EXTRA STRENGTH PO) Take 2 tablets by mouth every 6 (six) hours as needed.    . Biotin 1000 MCG tablet Take 1,000 mcg by mouth daily.     . Calcium Carbonate-Vitamin D (CALCIUM 500 + D) 500-125 MG-UNIT TABS Take 1 tablet by mouth daily.     . carvedilol (COREG) 3.125 MG tablet TAKE 1 TABLET (3.125 MG TOTAL) BY MOUTH 2 (TWO) TIMES DAILY. 180 tablet 0  . denosumab (PROLIA) 60 MG/ML SOSY injection Inject 60 mg into the skin every 6 (six) months. Last injection 04-22-2019    . Diclofenac Sodium 3 % GEL Apply a pea sized amount to affected joint twice daily as needed. 100 g 2  . docusate sodium (STOOL SOFTENER) 100 MG capsule Take 100 mg by mouth 2 (two) times daily.      Marland Kitchen gabapentin (NEURONTIN) 300 MG capsule TAKE '300MG'$  IN THE MORNING, '300MG'$  IN THE AFTERNOON, AND '600MG'$  AT BEDTIME. 360 capsule 1  . Glucosamine-Chondroit-Vit C-Mn (GLUCOSAMINE CHONDR 1500 COMPLX PO) Take 1 capsule by mouth daily.     Marland Kitchen leflunomide (ARAVA) 10 MG tablet TAKE 1 TABLET BY MOUTH EVERY DAY 90 tablet 0  . levothyroxine (SYNTHROID) 75 MCG tablet Take 1 tablet (75 mcg total) by mouth daily before breakfast. 30 tablet 5  . methocarbamol (ROBAXIN) 500 MG tablet TAKE 1 TABLET (500 MG TOTAL) BY MOUTH EVERY 6 (SIX) HOURS AS NEEDED FOR MUSCLE SPASMS. 40 tablet 0  . Multiple Vitamin (MULTIVITAMIN) tablet Take 1 tablet by mouth daily.      Marland Kitchen omeprazole (PRILOSEC) 20 MG capsule TAKE 1 CAPSULE BY MOUTH 2 TIMES DAILY BEFORE A MEAL. 180 capsule 1  . Probiotic Product (PROBIOTIC PO) Take by mouth daily.      No current facility-administered medications for this visit.    Physical Exam:     BP 120/74   Pulse 64   Temp (!) 97.3 F (36.3 C)   Ht 5' (1.524 m)   Wt 106 lb (48.1 kg)   BMI 20.70 kg/m   GENERAL:  Pleasant female in NAD PSYCH: : Cooperative, normal affect NEURO: Alert and oriented x 3, no focal neurologic deficits Rectal exam: Sensation intact and preserved anal wink.   Small external anal fissure in the 3 o'clock position.  Grade 2 hemorrhoids noted RP position.  Normal sphincter tone. No palpable mass. No blood on the exam glove. (Chaperone: Grace Bushy, LPN).   IMPRESSION and PLAN:    1) Symptomatic internal hemorrhoids 2) Hematochezia  PROCEDURE NOTE: The patient presents with symptomatic grade 2 hemorrhoids, unresponsive to maximal medical therapy, requesting rubber band ligation of symptomatic hemorrhoidal disease.  All risks, benefits and alternative forms of therapy were described and informed consent was obtained.  In the Left Lateral Decubitus position, anoscopic examination revealed grade 2 hemorrhoids in the RP position(s).  The anorectum was pre-medicated with RectiCare. The decision was  made to band the RP internal hemorrhoid, and the [place] was used to perform band ligation without complication.  Digital anorectal examination was then performed to assure proper positioning of the band, and to adjust the banded tissue as required.  The patient was discharged home without pain or other issues.  Dietary and behavioral recommendations were given and along with follow-up instructions.     The following adjunctive treatments were recommended:  -Resume high-fiber diet with fiber supplement (i.e. Citrucel or Benefiber) with goal for soft stools without straining to have a BM. -Resume adequate fluid intake.  The patient will return as needed if return of hemorrhoid symptoms, with repeat banding as needed. No complications were encountered and the  patient tolerated the procedure well.  3) Constipation: -Improved.  Resume Miralax, stool softener, and high fiber diet - Suspect multifactorial in nature, to include underlying Sjogrens, scoliosis -Resume increased fluid intake  4) GERD 5) Hiatal hernia - Well controlled on current regimen- Prilosec 20 mg daily with a 2nd dose taken prn prior to known exacerbating foods (ie, tomato based sauce)  6) Anemia -H/H largely stable on most recent labs. -Repeat iron panel with next lab draw as previously ordered in the Hematology clinic -Resume oral iron as tolerated -Can continue to follow-up with PCM and Hematology  7) External anal fissure: -Very small, minimally bothersome external anal fissure which occurred during large caliber stool 2-3 days ago.  No symptoms today.  Conservative management only.  RTC as needed  Lavena Bullion ,DO, FACG 05/31/2019, 11:38 AM

## 2019-06-08 DIAGNOSIS — M47816 Spondylosis without myelopathy or radiculopathy, lumbar region: Secondary | ICD-10-CM | POA: Diagnosis not present

## 2019-06-13 MED ORDER — CARVEDILOL 3.125 MG PO TABS: ORAL_TABLET | ORAL | 3 refills | Status: DC

## 2019-06-16 ENCOUNTER — Ambulatory Visit: Payer: Medicare PPO | Admitting: Family Medicine

## 2019-06-16 ENCOUNTER — Encounter: Payer: Self-pay | Admitting: Family Medicine

## 2019-06-16 ENCOUNTER — Other Ambulatory Visit: Payer: Self-pay

## 2019-06-16 VITALS — BP 145/80 | HR 80 | Temp 97.8°F | Resp 16 | Ht 60.0 in | Wt 103.0 lb

## 2019-06-16 DIAGNOSIS — I10 Essential (primary) hypertension: Secondary | ICD-10-CM

## 2019-06-16 DIAGNOSIS — R7989 Other specified abnormal findings of blood chemistry: Secondary | ICD-10-CM | POA: Diagnosis not present

## 2019-06-16 DIAGNOSIS — E039 Hypothyroidism, unspecified: Secondary | ICD-10-CM

## 2019-06-16 LAB — TSH: TSH: 0.54 u[IU]/mL (ref 0.35–4.50)

## 2019-06-16 LAB — FERRITIN: Ferritin: 658.8 ng/mL — ABNORMAL HIGH (ref 10.0–291.0)

## 2019-06-16 NOTE — Progress Notes (Signed)
[place] at [facility] [address] [place], [place] [address] [phone]  Date:  06/16/2019   Name:  [name]   DOB:  May 08, 1930   MRN:  [id number]  PCP:  Darreld Mclean, MD    Chief Complaint: Alopecia (needing blood work)   History of Present Illness:  [name] is a 86 y.o. very pleasant female patient who presents with the following:  Elderly lady here today for a follow-up visit.  History of hypertension, lung cancer, Sjogren's syndrome,, osteoporosis, complications from total hip, hypothyroidism  She was seen by GI, Dr. Bryan Lemma earlier this month to follow-up treatment of hemorrhoids-this was helpful for her, she is pleased with her results  I recently adjusted her dose of levothyroxine from 50 to 75 mcg due to suppressed TSH  Lab Results  Component Value Date   TSH 0.54 06/16/2019   We will follow up on this today with TSH level She had injections in her lower back last week for degenerative disc disease but they did not seem to help COVID-19 vaccine- done, pt is not sure of dates.  Phizer Covid vaccine  She was seen by cardiology last month as follows 1 cardiomyopathy-previous LV function mildly reduced but most recent study shows normal LV function.  We will continue with carvedilol at present dose. 2 history of mild to moderate mitral regurgitation-patient will need follow-up echoes in the future. 3 left bundle branch block-noted on prior electrocardiogram. 4 palpitations-more prolonged and more frequent.  I will arrange an event monitor to further assess.  She wore a heart monitor but stopped after 2 weeks due to skin damage from the adhesive  As far she knows, the 2 weeks of monitoring or okay She is not longer taking spironolactone  Pt notes that [employer] is trying to give her a home visit-she is not sure if this is necessary, but has decided to go along with it  Overall she is feeling  well, her normal self Her main concern is that her hair seems to be thinning-this could be related to thyroid Patient Active Problem List   Diagnosis Date Noted  . Popliteal artery occlusion, right ([place]) 04/10/2018  . HTN (hypertension) 04/10/2018  . Femoral artery pseudo-aneurysm, right ([place]) 04/09/2018  . Anemia of chronic disease 02/24/2018  . Unstable right hip arthroplasty 01/24/2018  . History of revision of total replacement of right hip joint 01/24/2018  . Hypovolemic shock ([place])   . Hyperkalemia   . Hyponatremia   . Failed total hip arthroplasty ([place]) 11/27/2017  . Status post revision of total hip 11/27/2017  . Elevated cholesterol 10/11/2015  . 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[place]) 10/04/2014  . Bilateral leg edema 08/01/2014  . Elevated BP 08/01/2014  . Rheumatoid arthritis involving multiple joints ([place]) 05/30/2014  . Status post total replacement of right hip 04/28/2014  . Long-term use of high-risk medication 11/11/2013  . Symptomatic anemia 11/11/2013  . Neuropathic pain 07/07/2013  . Constipation due to pain medication 07/07/2013  . Protein-calorie malnutrition, severe ([place]) 06/08/2013  . Lung cancer, Right upper lobe 05/08/2013  . Sciatica of right side 08/13/2011  . Osteoporosis 03/07/2010  . PARESTHESIA 03/07/2010  . CT, CHEST, ABNORMAL 12/18/2008  . ABNORMAL ECHOCARDIOGRAM 12/14/2008  . [place] SYNDROME 11/29/2008  . HYPOGLYCEMIA 06/29/2006  . RAYNAUD'S DISEASE 06/29/2006    Past Medical History:  Diagnosis Date  . Arthralgia of multiple joints    followed by dr Gerilyn Nestle  .  Arthritis   . Cardiomyopathy ([place])   . Chronic constipation   . Chronic inflammatory arthritis    rhemotolgist-  dr a. Gerilyn Nestle [facility])  . Dry eyes    eye drops used   . GERD (gastroesophageal reflux disease)   . H/O discoid lupus erythematosus   . Hiatal hernia   . History of colon polyps   . Hypothyroidism   . Iron deficiency anemia   . LBBB (left bundle  branch block) 2010  . Mitchell's disease (erythromelalgia) [facility])    neurologist-  dr patel  . Nocturia   . Non-small cell cancer of right lung [facility]) surgeon-- dr gerhardt/  oncologist-  dr Julien Nordmann--- per lov notes no recurrence/   11-18-2017 per pt denies any symptoms   dx 2015--  Stage IIA (T2b,N0,M0) , +EGFR  mutation in exon 21, non-small cell adenocarcinoma right upper lobe---  s/p  Right upper lobectomy , right middley wedge resection and node dissection---  no chemo or radiation therapy  . OA (osteoarthritis)    hands  . Osteoporosis   . PONV (postoperative nausea and vomiting)    likes phenergan  . Raynaud's phenomenon 1965  . Renal insufficiency   . Rheumatoid arthritis ([place])   . Sciatica   . Scoliosis   . Sjogren's syndrome [facility])     Past Surgical History:  Procedure Laterality Date  . ANTERIOR HIP REVISION Right 11/27/2017   Procedure: RIGHT HIP ACETABULAR REVISION;  Surgeon: [name], MD;  Location: [facility];  Service: Orthopedics;  Laterality: Right;  . ANTERIOR HIP REVISION Right 01/24/2018   Procedure: OPEN REDUCTION OF DISLOCATED ANTERIOR HIP WITH REVISION OF LINER AND HIP BALL;  Surgeon: [name], MD;  Location: [facility];  Service: Orthopedics;  Laterality: Right;  . APPENDECTOMY  1950s  . BIOPSY  04/14/2018   Procedure: BIOPSY;  Surgeon: Yetta Flock, MD;  Location: [place];  Service: Gastroenterology;;  . BIOPSY  04/16/2018   Procedure: BIOPSY;  Surgeon: Irving Copas., MD;  Location: [facility];  Service: Gastroenterology;;  . CARDIOVASCULAR STRESS TEST  12/2008    mild fixed basal to mid septal perfusion defect felt likely due to artifact from LBBB, no ischemia, EF 58%  . COLONOSCOPY    . COLONOSCOPY WITH PROPOFOL N/A 04/16/2018   Procedure: COLONOSCOPY WITH PROPOFOL;  Surgeon: Rush Landmark Telford Nab., MD;  Location: [place];  Service: Gastroenterology;  Laterality: N/A;  . ESOPHAGOGASTRODUODENOSCOPY (EGD) WITH  PROPOFOL N/A 04/14/2018   Procedure: ESOPHAGOGASTRODUODENOSCOPY (EGD) WITH PROPOFOL;  Surgeon: Yetta Flock, MD;  Location: [place];  Service: Gastroenterology;  Laterality: N/A;  . FEMORAL-POPLITEAL BYPASS GRAFT Right 04/10/2018   Procedure: REPAIR RIGHT FEMORAL ARTERY PSEUDOANEURYSM, RETROPERITONEAL EXPOSURE OF ILIAC ARTERY, RIGHT POPLITEAL EMBOLECTOMY;  Surgeon: Angelia Mould, MD;  Location: [place];  Service: Vascular;  Laterality: Right;  . LYMPH NODE DISSECTION Right 06/07/2013   Procedure: LYMPH NODE DISSECTION;  Surgeon: [name], MD;  Location: [place];  Service: Thoracic;  Laterality: Right;  . PATCH ANGIOPLASTY Right 04/10/2018   Procedure: PATCH  ANGIOPLASTY OF RIGHT FEMORAL ARTERY USING BOVINE PATCH, PATCH ANGIOPLASTY OF RIGHT POPLITEAL ARTERY USING BOVINE PATCH;  Surgeon: Angelia Mould, MD;  Location: [place];  Service: Vascular;  Laterality: Right;  . [place]   "large incision from chest to up to shoulder, the nerves were tied together, for raynaud's  . THORACOTOMY  06/07/2013   Procedure: MINI/LIMITED THORACOTOMY; right middle lobe wedge resection;  Surgeon: [name], MD;  Location: [place];  Service: Thoracic;;  . TONSILLECTOMY  child  . TOTAL ABDOMINAL HYSTERECTOMY  1980's    W/ BSO  . TOTAL HIP ARTHROPLASTY Right 04/28/2014   Procedure: RIGHT TOTAL HIP ARTHROPLASTY ANTERIOR APPROACH;  Surgeon: [name], MD;  Location: [facility];  Service: Orthopedics;  Laterality: Right;  . TRANSTHORACIC ECHOCARDIOGRAM  12/11/2008   ef 50-27%, grade 1 diastolic dysfunction/  mild LAE/  mild AR and MR/  trivial TR  . VIDEO ASSISTED THORACOSCOPY (VATS)/WEDGE RESECTION Right 06/07/2013   Procedure: VIDEO ASSISTED THORACOSCOPY (VATS)/right upper lobectomy, On Q;  Surgeon: [name], MD;  Location: [place];  Service: Thoracic;  Laterality: Right;  Marland Kitchen VIDEO BRONCHOSCOPY N/A 06/07/2013   Procedure: VIDEO BRONCHOSCOPY;  Surgeon:  [name], MD;  Location: [facility] OR;  Service: Thoracic;  Laterality: N/A;  . VIDEO BRONCHOSCOPY WITH ENDOBRONCHIAL NAVIGATION N/A 05/04/2013   Procedure: VIDEO BRONCHOSCOPY WITH ENDOBRONCHIAL NAVIGATION;  Surgeon: [name], MD;  Location: [place];  Service: Thoracic;  Laterality: N/A;    Social History   Tobacco Use  . Smoking status: Never Smoker  . Smokeless tobacco: Never Used  Substance Use Topics  . Alcohol use: Not Currently  . Drug use: Never    Family History  Problem Relation Age of Onset  . Coronary artery disease Father   . Colon cancer Father   . Diabetes Father   . Cancer Father        colon  . Other Mother 73       MVA  . Healthy Sister   . Healthy Brother   . Healthy Daughter   . Hypothyroidism Daughter   . Other Brother        killed in war  . Pneumonia Sister   . Healthy Daughter   . Esophageal cancer Neg Hx   . Kidney disease Neg Hx   . Liver disease Neg Hx     Allergies  Allergen Reactions  . Amlodipine Rash  . Prochlorperazine Edisylate Anaphylaxis    Compazine--- tongue swells and rash  . Aspirin Other (See Comments)    nose bleeds. Cannot take NSAIDS   . Cymbalta [Duloxetine Hcl] Diarrhea, Nausea And Vomiting and Other (See Comments)    Increased blood pressure  . Pamelor [Nortriptyline Hcl] Diarrhea and Nausea Only    Increased Heart rate and BP    Medication list has been reviewed and updated.  Current Outpatient Medications on File Prior to Visit  Medication Sig Dispense Refill  . Acetaminophen (TYLENOL EXTRA STRENGTH PO) Take 2 tablets by mouth every 6 (six) hours as needed.    . Biotin 1000 MCG tablet Take 1,000 mcg by mouth daily.     . Calcium Carbonate-Vitamin D (CALCIUM 500 + D) 500-125 MG-UNIT TABS Take 1 tablet by mouth daily.     . carvedilol (COREG) 3.125 MG tablet TAKE 1 TABLET (3.125 MG TOTAL) BY MOUTH 2 (TWO) TIMES DAILY. 180 tablet 3  . denosumab (PROLIA) 60 MG/ML SOSY injection Inject 60 mg into the skin  every 6 (six) months. Last injection 04-22-2019    . Diclofenac Sodium 3 % GEL Apply a pea sized amount to affected joint twice daily as needed. 100 g 2  . docusate sodium (STOOL SOFTENER) 100 MG capsule Take 100 mg by mouth 2 (two) times daily.      Marland Kitchen gabapentin (NEURONTIN) 300 MG capsule TAKE '300MG'$  IN THE MORNING, '300MG'$  IN THE AFTERNOON, AND '600MG'$   AT BEDTIME. 360 capsule 1  . Glucosamine-Chondroit-Vit C-Mn (GLUCOSAMINE CHONDR 1500 COMPLX PO) Take 1 capsule by mouth daily.     Marland Kitchen leflunomide (ARAVA) 10 MG tablet TAKE 1 TABLET BY MOUTH EVERY DAY 90 tablet 0  . levothyroxine (SYNTHROID) 75 MCG tablet Take 1 tablet (75 mcg total) by mouth daily before breakfast. 30 tablet 5  . methocarbamol (ROBAXIN) 500 MG tablet TAKE 1 TABLET (500 MG TOTAL) BY MOUTH EVERY 6 (SIX) HOURS AS NEEDED FOR MUSCLE SPASMS. 40 tablet 0  . Multiple Vitamin (MULTIVITAMIN) tablet Take 1 tablet by mouth daily.      Marland Kitchen omeprazole (PRILOSEC) 20 MG capsule TAKE 1 CAPSULE BY MOUTH 2 TIMES DAILY BEFORE A MEAL. 180 capsule 1  . Probiotic Product (PROBIOTIC PO) Take by mouth daily.    . meloxicam (MOBIC) 7.5 MG tablet      No current facility-administered medications on file prior to visit.    Review of Systems:  As per HPI- otherwise negative. She uses heat as needed for her joints as well as tylenol   Physical Examination: Vitals:   06/16/19 1051 06/16/19 1104  BP: (!) 151/85 (!) 145/80  Pulse: 80   Resp: 16   Temp: 97.8 F (36.6 C)   SpO2: 96%    Vitals:   06/16/19 1051  Weight: 103 lb (46.7 kg)  Height: 5' (1.524 m)   Body mass index is 20.12 kg/m. Ideal Body Weight: Weight in (lb) to have BMI = 25: 127.7  GEN: no acute distress.  Petite build, normal weight.  Looks well and her normal self HEENT: Atraumatic, Normocephalic.  Ears and Nose: No external deformity. CV: RRR, No M/G/R. No JVD. No thrill. No extra heart sounds. PULM: CTA B, no wheezes, crackles, rhonchi. No retractions. No resp. distress. No  accessory muscle use. EXTR: No c/c/e PSYCH: Normally interactive. Conversant.  She has significant degenerative change of her PIP joints in both hands, esp right   BP Readings from Last 3 Encounters:  06/16/19 (!) 145/80  05/31/19 120/74  05/18/19 (!) 148/80    Assessment and Plan: Acquired hypothyroidism - Plan: TSH  Essential hypertension  High serum ferritin - Plan: Ferritin  Here today for follow-up visit.  Check TSH today Patient has her hair is been thinning She had high ferritin in the past, will repeat this for her today  Blood pressure slightly high, but given patient age will allow mild hypertension Continue carvedilol Will plan further follow- up pending labs. This visit occurred during the SARS-CoV-2 public health emergency.  Safety protocols were in place, including screening questions prior to the visit, additional usage of staff PPE, and extensive cleaning of exam room while observing appropriate contact time as indicated for disinfecting solutions.    Signed Lamar Blinks, MD  Received her labs as below, message to patient Ferritin is lower than it was last year- likely an inflammatory marker in her case they are Thyroid now in normal Results for orders placed or performed in visit on 06/16/19  TSH  Result Value Ref Range   TSH 0.54 0.35 - 4.50 uIU/mL  Ferritin  Result Value Ref Range   Ferritin 658.8 (H) 10.0 - 291.0 ng/mL

## 2019-06-16 NOTE — Patient Instructions (Addendum)
It is always a pleasure to see you, I will be in touch with your labs as soon as possible

## 2019-06-17 ENCOUNTER — Other Ambulatory Visit: Payer: Self-pay

## 2019-06-17 ENCOUNTER — Inpatient Hospital Stay (HOSPITAL_COMMUNITY): Payer: Medicare PPO | Admitting: Registered Nurse

## 2019-06-17 ENCOUNTER — Encounter (HOSPITAL_COMMUNITY): Payer: Self-pay

## 2019-06-17 ENCOUNTER — Telehealth: Payer: Self-pay | Admitting: Gastroenterology

## 2019-06-17 ENCOUNTER — Encounter (HOSPITAL_COMMUNITY)
Admission: EM | Disposition: A | Discharge status: Home / Self Care | Payer: Self-pay | Source: Home / Self Care | Attending: Emergency Medicine

## 2019-06-17 ENCOUNTER — Observation Stay (HOSPITAL_COMMUNITY)
Admission: EM | Admit: 2019-06-17 | Discharge: 2019-06-18 | Disposition: A | Discharge status: Home / Self Care | Payer: Medicare PPO | Attending: Family Medicine | Admitting: Family Medicine

## 2019-06-17 DIAGNOSIS — M3500 Sicca syndrome, unspecified: Secondary | ICD-10-CM | POA: Diagnosis not present

## 2019-06-17 DIAGNOSIS — I10 Essential (primary) hypertension: Secondary | ICD-10-CM | POA: Diagnosis not present

## 2019-06-17 DIAGNOSIS — Z79899 Other long term (current) drug therapy: Secondary | ICD-10-CM | POA: Insufficient documentation

## 2019-06-17 DIAGNOSIS — K641 Second degree hemorrhoids: Secondary | ICD-10-CM

## 2019-06-17 DIAGNOSIS — E039 Hypothyroidism, unspecified: Secondary | ICD-10-CM | POA: Diagnosis not present

## 2019-06-17 DIAGNOSIS — D62 Acute posthemorrhagic anemia: Secondary | ICD-10-CM

## 2019-06-17 DIAGNOSIS — Z03818 Encounter for observation for suspected exposure to other biological agents ruled out: Secondary | ICD-10-CM | POA: Diagnosis not present

## 2019-06-17 DIAGNOSIS — Z85118 Personal history of other malignant neoplasm of bronchus and lung: Secondary | ICD-10-CM | POA: Diagnosis not present

## 2019-06-17 DIAGNOSIS — M19011 Primary osteoarthritis, right shoulder: Secondary | ICD-10-CM | POA: Insufficient documentation

## 2019-06-17 DIAGNOSIS — E875 Hyperkalemia: Secondary | ICD-10-CM | POA: Insufficient documentation

## 2019-06-17 DIAGNOSIS — K626 Ulcer of anus and rectum: Secondary | ICD-10-CM | POA: Diagnosis not present

## 2019-06-17 DIAGNOSIS — K625 Hemorrhage of anus and rectum: Secondary | ICD-10-CM | POA: Diagnosis not present

## 2019-06-17 DIAGNOSIS — M069 Rheumatoid arthritis, unspecified: Secondary | ICD-10-CM | POA: Diagnosis present

## 2019-06-17 DIAGNOSIS — I7381 Erythromelalgia: Secondary | ICD-10-CM | POA: Insufficient documentation

## 2019-06-17 DIAGNOSIS — Z791 Long term (current) use of non-steroidal anti-inflammatories (NSAID): Secondary | ICD-10-CM | POA: Diagnosis not present

## 2019-06-17 DIAGNOSIS — Z8616 Personal history of COVID-19: Secondary | ICD-10-CM | POA: Insufficient documentation

## 2019-06-17 DIAGNOSIS — I7300 Raynaud's syndrome without gangrene: Secondary | ICD-10-CM | POA: Diagnosis not present

## 2019-06-17 DIAGNOSIS — D638 Anemia in other chronic diseases classified elsewhere: Secondary | ICD-10-CM | POA: Diagnosis present

## 2019-06-17 DIAGNOSIS — Z86010 Personal history of colonic polyps: Secondary | ICD-10-CM | POA: Diagnosis not present

## 2019-06-17 DIAGNOSIS — K219 Gastro-esophageal reflux disease without esophagitis: Secondary | ICD-10-CM | POA: Diagnosis not present

## 2019-06-17 DIAGNOSIS — I429 Cardiomyopathy, unspecified: Secondary | ICD-10-CM | POA: Insufficient documentation

## 2019-06-17 DIAGNOSIS — Z888 Allergy status to other drugs, medicaments and biological substances status: Secondary | ICD-10-CM | POA: Insufficient documentation

## 2019-06-17 DIAGNOSIS — Z20822 Contact with and (suspected) exposure to covid-19: Secondary | ICD-10-CM | POA: Diagnosis not present

## 2019-06-17 DIAGNOSIS — K922 Gastrointestinal hemorrhage, unspecified: Secondary | ICD-10-CM

## 2019-06-17 DIAGNOSIS — Z79890 Hormone replacement therapy (postmenopausal): Secondary | ICD-10-CM | POA: Insufficient documentation

## 2019-06-17 DIAGNOSIS — K91840 Postprocedural hemorrhage and hematoma of a digestive system organ or structure following a digestive system procedure: Secondary | ICD-10-CM | POA: Diagnosis not present

## 2019-06-17 DIAGNOSIS — L930 Discoid lupus erythematosus: Secondary | ICD-10-CM | POA: Diagnosis not present

## 2019-06-17 DIAGNOSIS — Z886 Allergy status to analgesic agent status: Secondary | ICD-10-CM | POA: Diagnosis not present

## 2019-06-17 DIAGNOSIS — E871 Hypo-osmolality and hyponatremia: Secondary | ICD-10-CM | POA: Diagnosis not present

## 2019-06-17 DIAGNOSIS — K5909 Other constipation: Secondary | ICD-10-CM | POA: Insufficient documentation

## 2019-06-17 HISTORY — PX: HEMOSTASIS CLIP PLACEMENT: SHX6857

## 2019-06-17 HISTORY — PX: FLEXIBLE SIGMOIDOSCOPY: SHX5431

## 2019-06-17 HISTORY — PX: SCHLEROTHERAPY: SHX5440

## 2019-06-17 LAB — POCT I-STAT, CHEM 8
BUN: 48 mg/dL — ABNORMAL HIGH (ref 8–23)
Calcium, Ion: 1.11 mmol/L — ABNORMAL LOW (ref 1.15–1.40)
Chloride: 103 mmol/L (ref 98–111)
Creatinine, Ser: 1.2 mg/dL — ABNORMAL HIGH (ref 0.44–1.00)
Glucose, Bld: 123 mg/dL — ABNORMAL HIGH (ref 70–99)
HCT: 31 % — ABNORMAL LOW (ref 36.0–46.0)
Hemoglobin: 10.5 g/dL — ABNORMAL LOW (ref 12.0–15.0)
Potassium: 7.1 mmol/L (ref 3.5–5.1)
Sodium: 133 mmol/L — ABNORMAL LOW (ref 135–145)
TCO2: 28 mmol/L (ref 22–32)

## 2019-06-17 LAB — CBC
HCT: 32.5 % — ABNORMAL LOW (ref 36.0–46.0)
Hemoglobin: 10 g/dL — ABNORMAL LOW (ref 12.0–15.0)
MCH: 29.4 pg (ref 26.0–34.0)
MCHC: 30.8 g/dL (ref 30.0–36.0)
MCV: 95.6 fL (ref 80.0–100.0)
Platelets: 192 10*3/uL (ref 150–400)
RBC: 3.4 MIL/uL — ABNORMAL LOW (ref 3.87–5.11)
RDW: 13.3 % (ref 11.5–15.5)
WBC: 4.6 10*3/uL (ref 4.0–10.5)
nRBC: 0 % (ref 0.0–0.2)

## 2019-06-17 LAB — RESPIRATORY PANEL BY RT PCR (FLU A&B, COVID)
Influenza A by PCR: NEGATIVE
Influenza B by PCR: NEGATIVE
SARS Coronavirus 2 by RT PCR: NEGATIVE

## 2019-06-17 LAB — COMPREHENSIVE METABOLIC PANEL
ALT: 15 U/L (ref 0–44)
AST: 27 U/L (ref 15–41)
Albumin: 3.9 g/dL (ref 3.5–5.0)
Alkaline Phosphatase: 37 U/L — ABNORMAL LOW (ref 38–126)
Anion gap: 7 (ref 5–15)
BUN: 35 mg/dL — ABNORMAL HIGH (ref 8–23)
CO2: 24 mmol/L (ref 22–32)
Calcium: 9.1 mg/dL (ref 8.9–10.3)
Chloride: 102 mmol/L (ref 98–111)
Creatinine, Ser: 1.24 mg/dL — ABNORMAL HIGH (ref 0.44–1.00)
GFR calc Af Amer: 46 mL/min — ABNORMAL LOW (ref >60)
GFR calc non Af Amer: 39 mL/min — ABNORMAL LOW (ref >60)
Glucose, Bld: 130 mg/dL — ABNORMAL HIGH (ref 70–99)
Potassium: 5.3 mmol/L — ABNORMAL HIGH (ref 3.5–5.1)
Sodium: 133 mmol/L — ABNORMAL LOW (ref 135–145)
Total Bilirubin: 0.6 mg/dL (ref 0.3–1.2)
Total Protein: 6.9 g/dL (ref 6.5–8.1)

## 2019-06-17 LAB — HEMOGLOBIN AND HEMATOCRIT, BLOOD
HCT: 25.9 % — ABNORMAL LOW (ref 36.0–46.0)
Hemoglobin: 8 g/dL — ABNORMAL LOW (ref 12.0–15.0)

## 2019-06-17 LAB — POC OCCULT BLOOD, ED: Fecal Occult Bld: POSITIVE — AB

## 2019-06-17 SURGERY — SIGMOIDOSCOPY, FLEXIBLE
Anesthesia: Monitor Anesthesia Care

## 2019-06-17 MED ORDER — PROPOFOL 500 MG/50ML IV EMUL
INTRAVENOUS | Status: DC | PRN
  Administered 2019-06-17: 75 ug/kg/min via INTRAVENOUS

## 2019-06-17 MED ORDER — RISAQUAD PO CAPS
ORAL_CAPSULE | Freq: Every day | ORAL | Status: DC
  Administered 2019-06-18: 11:00:00 1 via ORAL
  Filled 2019-06-17: qty 1

## 2019-06-17 MED ORDER — FENTANYL CITRATE (PF) 250 MCG/5ML IJ SOLN
INTRAMUSCULAR | Status: AC
  Filled 2019-06-17: qty 5

## 2019-06-17 MED ORDER — ONDANSETRON HCL 4 MG PO TABS: 4.0000 mg | ORAL_TABLET | Freq: Four times a day (QID) | ORAL | Status: DC | PRN

## 2019-06-17 MED ORDER — SODIUM CHLORIDE (PF) 0.9 % IJ SOLN
PREFILLED_SYRINGE | INTRAMUSCULAR | Status: DC | PRN
  Administered 2019-06-17: 3 mL

## 2019-06-17 MED ORDER — PANTOPRAZOLE SODIUM 40 MG PO TBEC
40.0000 mg | DELAYED_RELEASE_TABLET | Freq: Every day | ORAL | Status: DC
  Administered 2019-06-18: 40 mg via ORAL
  Filled 2019-06-17: qty 1

## 2019-06-17 MED ORDER — LEFLUNOMIDE 20 MG PO TABS
10.0000 mg | ORAL_TABLET | Freq: Every day | ORAL | Status: DC
  Administered 2019-06-18: 10 mg via ORAL
  Filled 2019-06-17: qty 0.5

## 2019-06-17 MED ORDER — GABAPENTIN 300 MG PO CAPS
600.0000 mg | ORAL_CAPSULE | Freq: Every day | ORAL | Status: DC
  Administered 2019-06-17: 600 mg via ORAL
  Filled 2019-06-17: qty 2

## 2019-06-17 MED ORDER — FENTANYL CITRATE (PF) 100 MCG/2ML IJ SOLN
INTRAMUSCULAR | Status: DC | PRN
  Administered 2019-06-17: 25 ug via INTRAVENOUS

## 2019-06-17 MED ORDER — PHENYLEPHRINE 40 MCG/ML (10ML) SYRINGE FOR IV PUSH (FOR BLOOD PRESSURE SUPPORT)
PREFILLED_SYRINGE | INTRAVENOUS | Status: DC | PRN
  Administered 2019-06-17: 80 ug via INTRAVENOUS

## 2019-06-17 MED ORDER — LACTATED RINGERS IV SOLN
INTRAVENOUS | Status: DC | PRN
Start: 2019-06-17 — End: 2019-06-17

## 2019-06-17 MED ORDER — GABAPENTIN 300 MG PO CAPS: 300.0000 mg | ORAL_CAPSULE | Freq: Three times a day (TID) | ORAL | Status: DC

## 2019-06-17 MED ORDER — LEVOTHYROXINE SODIUM 75 MCG PO TABS
75.0000 ug | ORAL_TABLET | Freq: Every day | ORAL | Status: DC
  Administered 2019-06-18: 75 ug via ORAL
  Filled 2019-06-17: qty 1

## 2019-06-17 MED ORDER — PROPOFOL 10 MG/ML IV BOLUS
INTRAVENOUS | Status: AC
  Filled 2019-06-17: qty 20

## 2019-06-17 MED ORDER — MUSCLE RUB 10-15 % EX CREA: TOPICAL_CREAM | CUTANEOUS | Status: DC | PRN

## 2019-06-17 MED ORDER — EPINEPHRINE 1 MG/10ML IJ SOSY
PREFILLED_SYRINGE | INTRAMUSCULAR | Status: AC
  Filled 2019-06-17: qty 10

## 2019-06-17 MED ORDER — CARVEDILOL 3.125 MG PO TABS
3.1250 mg | ORAL_TABLET | Freq: Two times a day (BID) | ORAL | Status: DC
  Administered 2019-06-17 – 2019-06-18 (×3): 3.125 mg via ORAL
  Filled 2019-06-17 (×3): qty 1

## 2019-06-17 MED ORDER — MENTHOL (TOPICAL ANALGESIC) 4 % EX GEL: Freq: Every day | CUTANEOUS | Status: DC | PRN

## 2019-06-17 MED ORDER — SODIUM CHLORIDE 0.9 % IV SOLN: INTRAVENOUS | Status: DC

## 2019-06-17 MED ORDER — FENTANYL CITRATE (PF) 100 MCG/2ML IJ SOLN
INTRAMUSCULAR | Status: AC
  Filled 2019-06-17: qty 2

## 2019-06-17 MED ORDER — LACTATED RINGERS IV SOLN: INTRAVENOUS | Status: DC

## 2019-06-17 MED ORDER — ONDANSETRON HCL 4 MG/2ML IJ SOLN: 4.0000 mg | Freq: Four times a day (QID) | INTRAMUSCULAR | Status: DC | PRN

## 2019-06-17 MED ORDER — ONDANSETRON HCL 4 MG/2ML IJ SOLN
INTRAMUSCULAR | Status: DC | PRN
  Administered 2019-06-17: 4 mg via INTRAVENOUS

## 2019-06-17 MED ORDER — METHOCARBAMOL 500 MG PO TABS: 500.0000 mg | ORAL_TABLET | Freq: Four times a day (QID) | ORAL | Status: DC | PRN

## 2019-06-17 MED ORDER — FENTANYL CITRATE (PF) 100 MCG/2ML IJ SOLN: 25.0000 ug | INTRAMUSCULAR | Status: DC | PRN

## 2019-06-17 MED ORDER — ACETAMINOPHEN 500 MG PO TABS
500.0000 mg | ORAL_TABLET | Freq: Four times a day (QID) | ORAL | Status: DC | PRN
  Administered 2019-06-17: 500 mg via ORAL
  Filled 2019-06-17: qty 1

## 2019-06-17 MED ORDER — GABAPENTIN 300 MG PO CAPS
300.0000 mg | ORAL_CAPSULE | Freq: Two times a day (BID) | ORAL | Status: DC
  Administered 2019-06-18 (×2): 300 mg via ORAL
  Filled 2019-06-17 (×2): qty 1

## 2019-06-17 NOTE — Telephone Encounter (Signed)
Spoke to patient who complains of large rectal bleeding and pressure. She had a banding done 2 weeks ago without problems until today.Spoke with Dr Bryan Lemma who recommends patient monitor her symptoms for now with office visit on Monday. If Sx continue then go to the ER. When I called patient back to let her know, she was in the bathroom stating the blood was gushing out of her with clots. Patient was advised to go to [facility] ER for evaluation. She agreed and will have her daughter take her ASAP.

## 2019-06-17 NOTE — Progress Notes (Signed)
Called ED to request patient's white belongings bag with extra Depends be sent to [phone].

## 2019-06-17 NOTE — Consult Note (Signed)
Referring Provider:  Dr. Maryan Rued, EDP Primary Care Physician:  Darreld Mclean, MD Primary Gastroenterologist:  Dr. Bryan Lemma  Reason for Consultation:  GI bleed  HPI: [name] is a 86 y.o. female with a history of GERD, Sjogren's syndrome, RA, Raynaud's, chronic constipation, discoid lupus, LBBB, Mitchell's disease, non-small cell cancer of right lung in 2015, OA, follows in the GI clinic for symptomatic hemorrhoids (hematochezia, perianal irritation), along with GERD/hiatal hernia and constipation.  Follows with Dr. Bryan Lemma and had hemorrhoid banding as follows:  -03/21/2019: Successful banding of the RA hemorrhoid -04/26/2019: Successful banding of the LL hemorrhoid -05/31/2019: Presented for hemorrhoid banding #3  Endoscopic History: -Colonoscopy (04/2018, Dr.Mansouraty): Grade 2 internal hemorrhoids, 3 small tubular adenomas -EGD (03/2018, Dr. Havery Moros): 7 cm HH, tortuous esophagus, localized nodularity at [place] (biopsy: H. pylori gastritis-treated as inpatient), Normal stomach/duodenum  She called our office today reporting rectal bleeding.  Says it came on her suddenly with an episode around 70 AM an episode around 9 PM.  When she alerted our office they advised her to present to the emergency department.  She since had another episode here in the emergency department.  It is described as large volume dark red rectal bleeding.  Felt somewhat dizzy upon presentation to the emergency department.  No abdominal pain, but gets cramping or rectal fullness and a sensation to move her bowels prior to the passing of blood.  Hemoglobin is 10.0 g.  Her baseline tends to run in the 10 g range.  BUN and creatinine are about stable at her baseline.   Past Medical History:  Diagnosis Date   Arthralgia of multiple joints    followed by dr Gerilyn Nestle   Arthritis    Cardiomyopathy ([place])    Chronic constipation    Chronic inflammatory arthritis    rhemotolgist-  dr a. Gerilyn Nestle  ([facility])   Dry eyes    eye drops used    GERD (gastroesophageal reflux disease)    H/O discoid lupus erythematosus    Hiatal hernia    History of colon polyps    Hypothyroidism    Iron deficiency anemia    LBBB (left bundle branch block) 2010   Mitchell's disease (erythromelalgia) [facility])    neurologist-  dr patel   Nocturia    Non-small cell cancer of right lung [facility]) surgeon-- dr gerhardt/  oncologist-  dr Julien Nordmann--- per lov notes no recurrence/   11-18-2017 per pt denies any symptoms   dx 2015--  Stage IIA (T2b,N0,M0) , +EGFR  mutation in exon 21, non-small cell adenocarcinoma right upper lobe---  s/p  Right upper lobectomy , right middley wedge resection and node dissection---  no chemo or radiation therapy   OA (osteoarthritis)    hands   Osteoporosis    PONV (postoperative nausea and vomiting)    likes phenergan   Raynaud's phenomenon 1965   Renal insufficiency    Rheumatoid arthritis ([place])    Sciatica    Scoliosis    Sjogren's syndrome [facility])     Past Surgical History:  Procedure Laterality Date   ANTERIOR HIP REVISION Right 11/27/2017   Procedure: RIGHT HIP ACETABULAR REVISION;  Surgeon: [name], MD;  Location: [facility];  Service: Orthopedics;  Laterality: Right;   ANTERIOR HIP REVISION Right 01/24/2018   Procedure: OPEN REDUCTION OF DISLOCATED ANTERIOR HIP WITH REVISION OF LINER AND HIP BALL;  Surgeon: [name], MD;  Location: [facility];  Service: Orthopedics;  Laterality: Right;   APPENDECTOMY  1950s   BIOPSY  04/14/2018   Procedure: BIOPSY;  Surgeon: Yetta Flock, MD;  Location: [place];  Service: Gastroenterology;;   BIOPSY  04/16/2018   Procedure: BIOPSY;  Surgeon: Irving Copas., MD;  Location: [place];  Service: Gastroenterology;;   CARDIOVASCULAR STRESS TEST  12/2008    mild fixed basal to mid septal perfusion defect felt likely due to artifact from LBBB, no ischemia, EF 58%    COLONOSCOPY     COLONOSCOPY WITH PROPOFOL N/A 04/16/2018   Procedure: COLONOSCOPY WITH PROPOFOL;  Surgeon: Irving Copas., MD;  Location: [place];  Service: Gastroenterology;  Laterality: N/A;   ESOPHAGOGASTRODUODENOSCOPY (EGD) WITH PROPOFOL N/A 04/14/2018   Procedure: ESOPHAGOGASTRODUODENOSCOPY (EGD) WITH PROPOFOL;  Surgeon: Yetta Flock, MD;  Location: [place];  Service: Gastroenterology;  Laterality: N/A;   FEMORAL-POPLITEAL BYPASS GRAFT Right 04/10/2018   Procedure: REPAIR RIGHT FEMORAL ARTERY PSEUDOANEURYSM, RETROPERITONEAL EXPOSURE OF ILIAC ARTERY, RIGHT POPLITEAL EMBOLECTOMY;  Surgeon: [place]ia Mould, MD;  Location: [place];  Service: Vascular;  Laterality: Right;   LYMPH NODE DISSECTION Right 06/07/2013   Procedure: LYMPH NODE DISSECTION;  Surgeon: [name], MD;  Location: [place];  Service: Thoracic;  Laterality: Right;   PATCH ANGIOPLASTY Right 04/10/2018   Procedure: PATCH  ANGIOPLASTY OF RIGHT FEMORAL ARTERY USING BOVINE PATCH, PATCH ANGIOPLASTY OF RIGHT POPLITEAL ARTERY USING BOVINE PATCH;  Surgeon: [place]ia Mould, MD;  Location: [place];  Service: Vascular;  Laterality: Right;   [place]   "large incision from chest to up to shoulder, the nerves were tied together, for raynaud's   THORACOTOMY  06/07/2013   Procedure: MINI/LIMITED THORACOTOMY; right middle lobe wedge resection;  Surgeon: [name], MD;  Location: [place];  Service: Thoracic;;   TONSILLECTOMY  child   TOTAL ABDOMINAL HYSTERECTOMY  1980's    W/ BSO   TOTAL HIP ARTHROPLASTY Right 04/28/2014   Procedure: RIGHT TOTAL HIP ARTHROPLASTY ANTERIOR APPROACH;  Surgeon: [name], MD;  [facility];  Service: Orthopedics;  Laterality: Right;   TRANSTHORACIC ECHOCARDIOGRAM  12/11/2008   ef 40-10%, grade 1 diastolic dysfunction/  mild LAE/  mild AR and MR/  trivial TR   VIDEO ASSISTED THORACOSCOPY (VATS)/WEDGE RESECTION Right 06/07/2013    Procedure: VIDEO ASSISTED THORACOSCOPY (VATS)/right upper lobectomy, On Q;  Surgeon: [name], MD;  Location: [place];  Service: Thoracic;  Laterality: Right;   VIDEO BRONCHOSCOPY N/A 06/07/2013   Procedure: VIDEO BRONCHOSCOPY;  Surgeon: [name], MD;  Location: [place];  Service: Thoracic;  Laterality: N/A;   VIDEO BRONCHOSCOPY WITH ENDOBRONCHIAL NAVIGATION N/A 05/04/2013   Procedure: VIDEO BRONCHOSCOPY WITH ENDOBRONCHIAL NAVIGATION;  Surgeon: [name], MD;  Location: [place];  Service: Thoracic;  Laterality: N/A;    Prior to Admission medications   Medication Sig Start Date End Date Taking? Authorizing Provider  Acetaminophen (TYLENOL EXTRA STRENGTH PO) Take 1-2 tablets by mouth every 6 (six) hours as needed (pain).    Yes [provider]  Biotin 1000 MCG tablet Take 1,000 mcg by mouth daily.    Yes [provider]  Calcium Carbonate-Vitamin D (CALCIUM 500 + D) 500-125 MG-UNIT TABS Take 1 tablet by mouth daily.    Yes [provider]  carvedilol (COREG) 3.125 MG tablet TAKE 1 TABLET (3.125 MG TOTAL) BY MOUTH 2 (TWO) TIMES DAILY. 06/13/19  Yes Lelon Perla, MD  denosumab (PROLIA) 60 MG/ML SOSY injection Inject 60 mg into the skin every 6 (six)  months. Last injection 04-22-2019   Yes [provider]  Diclofenac Sodium 3 % GEL Apply a pea sized amount to affected joint twice daily as needed. Patient taking differently: Apply 1 application topically 2 (two) times daily as needed (pain). Apply a pea sized amount to affected joint twice daily as needed. 10/26/18  Yes Deveshwar, Abel Presto, MD  docusate sodium (STOOL SOFTENER) 100 MG capsule Take 100 mg by mouth 2 (two) times daily.     Yes [provider]  gabapentin (NEURONTIN) 300 MG capsule TAKE '300MG'$  IN THE MORNING, '300MG'$  IN THE AFTERNOON, AND '600MG'$  AT BEDTIME. 01/04/19  Yes Copland, Gay Filler, MD  Glucosamine-Chondroit-Vit C-Mn (GLUCOSAMINE CHONDR 1500 COMPLX PO) Take 1 capsule by  mouth daily.    Yes [provider]  leflunomide (ARAVA) 10 MG tablet TAKE 1 TABLET BY MOUTH EVERY DAY 04/19/19  Yes Deveshwar, Abel Presto, MD  levothyroxine (SYNTHROID) 75 MCG tablet Take 1 tablet (75 mcg total) by mouth daily before breakfast. 05/19/19  Yes Copland, Gay Filler, MD  meloxicam (MOBIC) 7.5 MG tablet Take 7.5 mg by mouth in the morning and at bedtime.  05/24/19  Yes [provider]  Menthol, Topical Analgesic, (BIOFREEZE EX) Apply 1 application topically daily as needed (pain).   Yes [provider]  methocarbamol (ROBAXIN) 500 MG tablet TAKE 1 TABLET (500 MG TOTAL) BY MOUTH EVERY 6 (SIX) HOURS AS NEEDED FOR MUSCLE SPASMS. 05/13/19  Yes [name], MD  Multiple Vitamin (MULTIVITAMIN) tablet Take 1 tablet by mouth daily.     Yes [provider]  omeprazole (PRILOSEC) 20 MG capsule TAKE 1 CAPSULE BY MOUTH 2 TIMES DAILY BEFORE A MEAL. Patient taking differently: Take 20 mg by mouth daily.  03/03/19  Yes Copland, Gay Filler, MD  Probiotic Product (PROBIOTIC PO) Take 1 capsule by mouth daily.    Yes [provider]    No current facility-administered medications for this encounter.   Current Outpatient Medications  Medication Sig Dispense Refill   Acetaminophen (TYLENOL EXTRA STRENGTH PO) Take 1-2 tablets by mouth every 6 (six) hours as needed (pain).      Biotin 1000 MCG tablet Take 1,000 mcg by mouth daily.      Calcium Carbonate-Vitamin D (CALCIUM 500 + D) 500-125 MG-UNIT TABS Take 1 tablet by mouth daily.      carvedilol (COREG) 3.125 MG tablet TAKE 1 TABLET (3.125 MG TOTAL) BY MOUTH 2 (TWO) TIMES DAILY. 180 tablet 3   denosumab (PROLIA) 60 MG/ML SOSY injection Inject 60 mg into the skin every 6 (six) months. Last injection 04-22-2019     Diclofenac Sodium 3 % GEL Apply a pea sized amount to affected joint twice daily as needed. (Patient taking differently: Apply 1 application topically 2 (two) times daily as needed (pain). Apply a  pea sized amount to affected joint twice daily as needed.) 100 g 2   docusate sodium (STOOL SOFTENER) 100 MG capsule Take 100 mg by mouth 2 (two) times daily.       gabapentin (NEURONTIN) 300 MG capsule TAKE '300MG'$  IN THE MORNING, '300MG'$  IN THE AFTERNOON, AND '600MG'$  AT BEDTIME. 360 capsule 1   Glucosamine-Chondroit-Vit C-Mn (GLUCOSAMINE CHONDR 1500 COMPLX PO) Take 1 capsule by mouth daily.      leflunomide (ARAVA) 10 MG tablet TAKE 1 TABLET BY MOUTH EVERY DAY 90 tablet 0   levothyroxine (SYNTHROID) 75 MCG tablet Take 1 tablet (75 mcg total) by mouth daily before breakfast. 30 tablet 5   meloxicam (MOBIC) 7.5 MG  tablet Take 7.5 mg by mouth in the morning and at bedtime.      Menthol, Topical Analgesic, (BIOFREEZE EX) Apply 1 application topically daily as needed (pain).     methocarbamol (ROBAXIN) 500 MG tablet TAKE 1 TABLET (500 MG TOTAL) BY MOUTH EVERY 6 (SIX) HOURS AS NEEDED FOR MUSCLE SPASMS. 40 tablet 0   Multiple Vitamin (MULTIVITAMIN) tablet Take 1 tablet by mouth daily.       omeprazole (PRILOSEC) 20 MG capsule TAKE 1 CAPSULE BY MOUTH 2 TIMES DAILY BEFORE A MEAL. (Patient taking differently: Take 20 mg by mouth daily. ) 180 capsule 1   Probiotic Product (PROBIOTIC PO) Take 1 capsule by mouth daily.       Allergies as of 06/17/2019 - Review Complete 06/17/2019  Allergen Reaction Noted   Amlodipine Rash 08/31/2015   Prochlorperazine edisylate Anaphylaxis 11/29/2007   Aspirin Other (See Comments) 10/01/2009   Cymbalta [duloxetine hcl] Diarrhea, Nausea And Vomiting, and Other (See Comments) 11/18/2017   Pamelor [nortriptyline hcl] Diarrhea and Nausea Only 09/25/2016    Family History  Problem Relation Age of Onset   Coronary artery disease Father    Colon cancer Father    Diabetes Father    Cancer Father        colon   Other Mother 70       MVA   Healthy Sister    Healthy Brother    Healthy Daughter    Hypothyroidism Daughter    Other Brother         killed in war   Pneumonia Sister    Healthy Daughter    Esophageal cancer Neg Hx    Kidney disease Neg Hx    Liver disease Neg Hx     Social History   Socioeconomic History   Marital status: Widowed    Spouse name: Not on file   Number of children: 2   Years of education: Not on file   Highest education level: Not on file  Occupational History   Occupation: n/a  Tobacco Use   Smoking status: Never Smoker   Smokeless tobacco: Never Used  Substance and Sexual Activity   Alcohol use: Not Currently   Drug use: Never   Sexual activity: Not Currently    Birth control/protection: Surgical  Other Topics Concern   Not on file  Social History Narrative   Lives with husband, daughter and grandchild local.   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   Social Determinants of Health   Financial Resource Strain:    Difficulty of Paying Living Expenses:   Food Insecurity:    Worried About Charity fundraiser in the Last Year:    Arboriculturist in the Last Year:   Transportation Needs:    Film/video editor (Medical):    Lack of Transportation (Non-Medical):   Physical Activity:    Days of Exercise per Week:    Minutes of Exercise per Session:   Stress:    Feeling of Stress :   Social Connections:    Frequency of Communication with Friends and Family:    Frequency of Social Gatherings with Friends and Family:    Attends Religious Services:    Active Member of Clubs or Organizations:    Attends Archivist Meetings:    Marital Status:   Intimate Partner Violence:    Fear of Current or Ex-Partner:    Emotionally Abused:    Physically Abused:  Sexually Abused:     Review of Systems: ROS is O/W negative except as mentioned.  Physical Exam: Vital signs in last 24 hours: Temp:  [98.1 F (36.7 C)] 98.1 F (36.7 C) (04/30 1336) Pulse Rate:  [75-97] 75 (04/30 1600) Resp:  [16-18] 18 (04/30  1554) BP: (146-167)/(72-87) 146/72 (04/30 1600) SpO2:  [96 %-98 %] 97 % (04/30 1600) Weight:  [46.7 kg] 46.7 kg (04/30 1339)   General:  Alert, Well-developed, well-nourished, pleasant and cooperative in NAD Head:  Normocephalic and atraumatic. Eyes:  Sclera clear, no icterus.  Conjunctiva pink. Ears:  Normal auditory acuity. Mouth:  No deformity or lesions.   Lungs:  Clear throughout to auscultation.  No wheezes, crackles, or rhonchi.  Heart:  Regular rate and rhythm; no murmurs, clicks, rubs, or gallops. Abdomen:  Soft, non-distended.  BS present.  Non-tender.  Rectal:  Deferred.  Msk:  Symmetrical without gross deformities. Pulses:  Normal pulses noted. Extremities:  Without clubbing or edema. Neurologic:  Alert and oriented x 4;  grossly normal neurologically. Skin:  Intact without significant lesions or rashes. Psych:  Alert and cooperative. Normal mood and affect.  Lab Results: Recent Labs    06/17/19 1350  WBC 4.6  HGB 10.0*  HCT 32.5*  PLT 192   BMET Recent Labs    06/17/19 1350  NA 133*  K 5.3*  CL 102  CO2 24  GLUCOSE 130*  BUN 35*  CREATININE 1.24*  CALCIUM 9.1   LFT Recent Labs    06/17/19 1350  PROT 6.9  ALBUMIN 3.9  AST 27  ALT 15  ALKPHOS 37*  BILITOT 0.6   IMPRESSION:  *GI bleed:  Differential includes bleeding at recent hemorrhoid banding site/ulcer vs diverticular vs less likely UGIB.  Right now hemoglobin is stable, but she has had 3 episodes of large-volume rectal bleeding.  PLAN: -Flex sig today with Dr. Bryan Lemma with possible intervention if it is at the hemorrhoid banding site. -Admit to hospital at least overnight for observation.  Monitor hemoglobin and transfuse as needed. -If flex sig negative then will likely need nuc med bleeding scan versus CTA to locate bleeding source.   Laban Emperor. Ettel Albergo  06/17/2019, 4:17 PM

## 2019-06-17 NOTE — Op Note (Signed)
[facility] Patient Name: [name] Procedure Date: 06/17/2019 MRN: [id number] Attending MD: Gerrit Heck , MD Date of Birth: 10-09-1930 CSN: [id number] Age: 86 Admit Type: Emergency Department Procedure:                Flexible Sigmoidoscopy Indications:              Hematochezia Providers:                Gerrit Heck, MD, [name], RN, Laverda Sorenson, Technician, Enrigue Catena, CRNA Referring MD:              Medicines:                Monitored Anesthesia Care Complications:            No immediate complications. Estimated Blood Loss:     Estimated blood loss was minimal. Procedure:                Pre-Anesthesia Assessment:                           - Prior to the procedure, a History and Physical                            was performed, and patient medications and                            allergies were reviewed. The patient's tolerance of                            previous anesthesia was also reviewed. The risks                            and benefits of the procedure and the sedation                            options and risks were discussed with the patient.                            All questions were answered, and informed consent                            was obtained. Prior Anticoagulants: The patient has                            taken no previous anticoagulant or antiplatelet                            agents. ASA Grade Assessment: III - A patient with                            severe systemic disease. After reviewing the risks  and benefits, the patient was deemed in                            satisfactory condition to undergo the procedure.                           After obtaining informed consent, the scope was                            passed under direct vision. The GIF-H190 ([id number])                            Olympus gastroscope was introduced through the anus               and advanced to the the rectum. The flexible                            sigmoidoscopy was accomplished without difficulty.                            The patient tolerated the procedure well. The                            quality of the bowel preparation was adequate. Scope In: Scope Out: Findings:      The perianal and digital rectal examinations were normal.      A single (solitary) ten mm ulcer was found in the distal rectum. There       was a visible vessel and brisk, active bleeding. For hemostasis, two       hemostatic clips were initially placed with cessation of bleeding.       Attempted to close the entire mucosal defect (ulcer) with additional       clips. However, due to extensive subepithelial scarring from prior       banding, the mucosa was not pliable enough to allow full tissue       apposition. Only 1 additional clip deployed in position, and 3 failed       clips. in total, 3 clips were successfully placed. However, the area of       active bleeding and visible vessel was successfully managed       endoscopically. The area was then successfully injected with 3 mL of a       1:10,000 solution of epinephrine for hemostasis.      Large amounts of clotted blood was found in the rectum. Of note, the       previously treated hemorrhoids were no longer visible. Impression:               - A single (solitary) ulcer in the distal rectum                            with active bleeding and a visible vessel. Complete                            cessation of bleeding with clips and Epinephrine  injection. The tissue surrounding the ulcer was                            hard and fibrotic, consistent with post banding                            scar, adn did not allow for full tissue closure of                            the ulcer defect.                           - Blood in the rectum.                           - No specimens collected. Moderate  Sedation:      Not Applicable - Patient had care per Anesthesia. Recommendation:           - Admit the patient to hospital ward for ongoing                            care.                           - Full liquid diet today.                           - Serial Hgb/Hct checks overnight Q6 hours with                            transfusion if Hgb <7.                           - Type and screen.                           - Continue laxatives and stool softeners that are                            prescribed as an outpatient to maintain stools that                            are soft and without straining to have bowel                            movement.                           - Repeat flexible sigmoidoscopy PRN for                            retreatment. If rebleeding, would plan for                            Hemospray deployment over the ulcer bed as the  tissue is not pliable enough to allow full mucosal                            closure.                           - Appreciate assistance by Hospitalist service in                            the admission of this patient.                           - I discussed these results with the patient and                            her daighter by phone per patient request. Procedure Code(s):        --- Professional ---                           [phone], 67, Sigmoidoscopy, flexible; with control of                            bleeding, any method Diagnosis Code(s):        --- Professional ---                           K62.6, Ulcer of anus and rectum                           K62.5, Hemorrhage of anus and rectum                           K92.1, Melena (includes Hematochezia) CPT copyright 2019 American Medical Association. All rights reserved. The codes documented in this report are preliminary and upon coder review may  be revised to meet current compliance requirements. Gerrit Heck, MD 06/17/2019 6:56:25 PM Number  of Addenda: 0

## 2019-06-17 NOTE — Interval H&P Note (Signed)
History and Physical Interval Note:  06/17/2019 6:14 PM  [name]  has presented today for surgery, with the diagnosis of Rectal bleeding.  The various methods of treatment have been discussed with the patient and family. After consideration of risks, benefits and other options for treatment, the patient has consented to  Procedure(s): FLEXIBLE SIGMOIDOSCOPY (N/A) and possible hemorrhoid banding as a surgical intervention.  The patient's history has been reviewed, patient examined, no change in status, stable for surgery.  I have reviewed the patient's chart and labs.  Questions were answered to the patient's satisfaction.  Discussed the additional risks of pain with hemorrhoid banding that could necessitate immediate removal afterwards, potentially in the Operating Room. She understands the risks and wishes to proceed.    Dominic Pea Esias Mory

## 2019-06-17 NOTE — Anesthesia Postprocedure Evaluation (Signed)
Anesthesia Post Note  Patient: [name]  Procedure(s) Performed: FLEXIBLE SIGMOIDOSCOPY (N/A ) [place] PLACEMENT     Patient location during evaluation: Endoscopy Anesthesia Type: MAC Level of consciousness: awake and alert, oriented and patient cooperative Pain management: pain level controlled (pt says she's comfortable) Vital Signs Assessment: post-procedure vital signs reviewed and stable Respiratory status: spontaneous breathing, nonlabored ventilation and respiratory function stable Cardiovascular status: blood pressure returned to baseline and stable Postop Assessment: no apparent nausea or vomiting Anesthetic complications: no    Last Vitals:  Vitals:   06/17/19 1853 06/17/19 1900  BP: 138/78 (!) 160/70  Pulse: 84 80  Resp: 18 (!) 22  Temp: 37 C   SpO2:      Last Pain:  Vitals:   06/17/19 1853  TempSrc: Oral  PainSc: 8                  Euclide Granito,E. Rick Warnick

## 2019-06-17 NOTE — Interval H&P Note (Signed)
History and Physical Interval Note:  06/17/2019 6:13 PM  [name]  has presented today for surgery, with the diagnosis of Rectal bleeding.  The various methods of treatment have been discussed with the patient and family. After consideration of risks, benefits and other options for treatment, the patient has consented to  Procedure(s): FLEXIBLE SIGMOIDOSCOPY (N/A) as a surgical intervention.  The patient's history has been reviewed, patient examined, no change in status, stable for surgery.  I have reviewed the patient's chart and labs.  Questions were answered to the patient's satisfaction.     [name]

## 2019-06-17 NOTE — Telephone Encounter (Signed)
Pt had 3rd banding 05/31/19.  She stated that when she experienced heavy bleeding today when she went to have a bm.  Please return call to discuss.

## 2019-06-17 NOTE — H&P (Signed)
[facility] History and Physical  [name] ZJQ:[id number] DOB: [birth year] DOA: 06/17/2019  Referring physician: ED  PCP: Darreld Mclean, MD   Patient is coming from: Home   Chief Complaint: Rectal bleed  HPI: [name] is a 86 y.o. female with past medical history of advanced rheumatoid arthritis, GERD, Sjogren's syndrome, discoid lupus erythematous, hypothyroidism, non-small cell lung cancer status post resection hospital with complaints of multiple episodes of bright red bleeding per rectum at least 3 times today.  She had significant amount of bright red blood.  Patient also felt dizzy lightheaded and near passing out spell with bleeding.  She did have some lower abdominal cramps associated with bleeding.  Of note patient did have hemorrhoidal banding in the past and the last banding was on 05/31/2019.  Patient denies any chest pain, palpitation, syncope.  Patient denies any nausea, vomiting but has crampy lower abdominal pain during bleeding.  Denies any urinary urgency, frequency or dysuria.  Patient lives by herself and has a walker for ambulation.  She does have aide who helps her out and her daughter lives nearby.  Does complain of pain in the right shoulder from arthritis.  ED Course: In the ED, patient was hemodynamically stable.  Hemoglobin was 0.0.  Hemoglobin a month back was 10.6.  Type and screen was sent to the ED.  Stool occult blood was positive.  Sodium was 133 with potassium of 5.3.  Sodium 137 a month back.  Creatinine of 1.2 similar to previous values.  Patient was then considered for admission to the hospital secondary to rectal bleed.  Review of Systems:  All systems were reviewed and were negative unless otherwise mentioned in the HPI  Past Medical History:  Diagnosis Date  . Arthralgia of multiple joints    followed by dr Gerilyn Nestle  . Arthritis   . Cardiomyopathy ([place])   . Chronic constipation   . Chronic inflammatory arthritis    rhemotolgist-  dr a. Gerilyn Nestle [facility])  . Dry eyes    eye drops used   . GERD (gastroesophageal reflux disease)   . H/O discoid lupus erythematosus   . Hiatal hernia   . History of colon polyps   . Hypothyroidism   . Iron deficiency anemia   . LBBB (left bundle branch block) 2010  . Mitchell's disease (erythromelalgia) [facility])    neurologist-  dr patel  . Nocturia   . Non-small cell cancer of right lung [facility]) surgeon-- dr gerhardt/  oncologist-  dr Julien Nordmann--- per lov notes no recurrence/   11-18-2017 per pt denies any symptoms   dx 2015--  Stage IIA (T2b,N0,M0) , +EGFR  mutation in exon 21, non-small cell adenocarcinoma right upper lobe---  s/p  Right upper lobectomy , right middley wedge resection and node dissection---  no chemo or radiation therapy  . OA (osteoarthritis)    hands  . Osteoporosis   . PONV (postoperative nausea and vomiting)    likes phenergan  . Raynaud's phenomenon 1965  . Renal insufficiency   . Rheumatoid arthritis ([place])   . Sciatica   . Scoliosis   . Sjogren's syndrome [facility])    Past Surgical History:  Procedure Laterality Date  . ANTERIOR HIP REVISION Right 11/27/2017   Procedure: RIGHT HIP ACETABULAR REVISION;  Surgeon: [name], MD;  Location: [facility];  Service: Orthopedics;  Laterality: Right;  . ANTERIOR HIP REVISION Right 01/24/2018   Procedure: OPEN REDUCTION OF DISLOCATED ANTERIOR HIP WITH REVISION OF LINER AND  HIP BALL;  Surgeon: [name], MD;  Location: [facility];  Service: Orthopedics;  Laterality: Right;  . APPENDECTOMY  1950s  . BIOPSY  04/14/2018   Procedure: BIOPSY;  Surgeon: Yetta Flock, MD;  Location: [place];  Service: Gastroenterology;;  . BIOPSY  04/16/2018   Procedure: BIOPSY;  Surgeon: Irving Copas., MD;  Location: [facility];  Service: Gastroenterology;;  . CARDIOVASCULAR STRESS TEST  12/2008    mild fixed basal to mid septal perfusion defect felt likely due to artifact from  LBBB, no ischemia, EF 58%  . COLONOSCOPY    . COLONOSCOPY WITH PROPOFOL N/A 04/16/2018   Procedure: COLONOSCOPY WITH PROPOFOL;  Surgeon: Rush Landmark Telford Nab., MD;  Location: [place];  Service: Gastroenterology;  Laterality: N/A;  . ESOPHAGOGASTRODUODENOSCOPY (EGD) WITH PROPOFOL N/A 04/14/2018   Procedure: ESOPHAGOGASTRODUODENOSCOPY (EGD) WITH PROPOFOL;  Surgeon: Yetta Flock, MD;  Location: [place];  Service: Gastroenterology;  Laterality: N/A;  . FEMORAL-POPLITEAL BYPASS GRAFT Right 04/10/2018   Procedure: REPAIR RIGHT FEMORAL ARTERY PSEUDOANEURYSM, RETROPERITONEAL EXPOSURE OF ILIAC ARTERY, RIGHT POPLITEAL EMBOLECTOMY;  Surgeon: Angelia Mould, MD;  Location: [place];  Service: Vascular;  Laterality: Right;  . LYMPH NODE DISSECTION Right 06/07/2013   Procedure: LYMPH NODE DISSECTION;  Surgeon: [name], MD;  Location: [place];  Service: Thoracic;  Laterality: Right;  . PATCH ANGIOPLASTY Right 04/10/2018   Procedure: PATCH  ANGIOPLASTY OF RIGHT FEMORAL ARTERY USING BOVINE PATCH, PATCH ANGIOPLASTY OF RIGHT POPLITEAL ARTERY USING BOVINE PATCH;  Surgeon: Angelia Mould, MD;  Location: [place];  Service: Vascular;  Laterality: Right;  . [place]   "large incision from chest to up to shoulder, the nerves were tied together, for raynaud's  . THORACOTOMY  06/07/2013   Procedure: MINI/LIMITED THORACOTOMY; right middle lobe wedge resection;  Surgeon: [name], MD;  Location: [place];  Service: Thoracic;;  . TONSILLECTOMY  child  . TOTAL ABDOMINAL HYSTERECTOMY  1980's    W/ BSO  . TOTAL HIP ARTHROPLASTY Right 04/28/2014   Procedure: RIGHT TOTAL HIP ARTHROPLASTY ANTERIOR APPROACH;  Surgeon: [name], MD;  Location: [facility];  Service: Orthopedics;  Laterality: Right;  . TRANSTHORACIC ECHOCARDIOGRAM  12/11/2008   ef 56-31%, grade 1 diastolic dysfunction/  mild LAE/  mild AR and MR/  trivial TR  . VIDEO ASSISTED THORACOSCOPY  (VATS)/WEDGE RESECTION Right 06/07/2013   Procedure: VIDEO ASSISTED THORACOSCOPY (VATS)/right upper lobectomy, On Q;  Surgeon: [name], MD;  Location: [place];  Service: Thoracic;  Laterality: Right;  Marland Kitchen VIDEO BRONCHOSCOPY N/A 06/07/2013   Procedure: VIDEO BRONCHOSCOPY;  Surgeon: [name], MD;  [facility];  Service: Thoracic;  Laterality: N/A;  . VIDEO BRONCHOSCOPY WITH ENDOBRONCHIAL NAVIGATION N/A 05/04/2013   Procedure: VIDEO BRONCHOSCOPY WITH ENDOBRONCHIAL NAVIGATION;  Surgeon: [name], MD;  Location: [place];  Service: Thoracic;  Laterality: N/A;    Social History:  reports that she has never smoked. She has never used smokeless tobacco. She reports previous alcohol use. She reports that she does not use drugs.  Allergies  Allergen Reactions  . Amlodipine Rash  . Prochlorperazine Edisylate Anaphylaxis    Compazine--- tongue swells and rash  . Aspirin Other (See Comments)    nose bleeds. Cannot take NSAIDS   . Cymbalta [Duloxetine Hcl] Diarrhea, Nausea And Vomiting and Other (See Comments)    Increased blood pressure  . Pamelor [Nortriptyline Hcl] Diarrhea and Nausea Only    Increased Heart rate and BP  Family History  Problem Relation Age of Onset  . Coronary artery disease Father   . Colon cancer Father   . Diabetes Father   . Cancer Father        colon  . Other Mother 66       MVA  . Healthy Sister   . Healthy Brother   . Healthy Daughter   . Hypothyroidism Daughter   . Other Brother        killed in war  . Pneumonia Sister   . Healthy Daughter   . Esophageal cancer Neg Hx   . Kidney disease Neg Hx   . Liver disease Neg Hx      Prior to Admission medications   Medication Sig Start Date End Date Taking? Authorizing Provider  Acetaminophen (TYLENOL EXTRA STRENGTH PO) Take 1-2 tablets by mouth every 6 (six) hours as needed (pain).    Yes [provider]  Biotin 1000 MCG tablet Take 1,000 mcg by mouth daily.    Yes [provider]  Calcium Carbonate-Vitamin D (CALCIUM 500 + D) 500-125 MG-UNIT TABS Take 1 tablet by mouth daily.    Yes [provider]  carvedilol (COREG) 3.125 MG tablet TAKE 1 TABLET (3.125 MG TOTAL) BY MOUTH 2 (TWO) TIMES DAILY. 06/13/19  Yes Lelon Perla, MD  denosumab (PROLIA) 60 MG/ML SOSY injection Inject 60 mg into the skin every 6 (six) months. Last injection 04-22-2019   Yes [provider]  Diclofenac Sodium 3 % GEL Apply a pea sized amount to affected joint twice daily as needed. Patient taking differently: Apply 1 application topically 2 (two) times daily as needed (pain). Apply a pea sized amount to affected joint twice daily as needed. 10/26/18  Yes Deveshwar, Abel Presto, MD  docusate sodium (STOOL SOFTENER) 100 MG capsule Take 100 mg by mouth 2 (two) times daily.     Yes [provider]  gabapentin (NEURONTIN) 300 MG capsule TAKE 300MG IN THE MORNING, 300MG IN THE AFTERNOON, AND 600MG AT BEDTIME. 01/04/19  Yes Copland, Gay Filler, MD  Glucosamine-Chondroit-Vit C-Mn (GLUCOSAMINE CHONDR 1500 COMPLX PO) Take 1 capsule by mouth daily.    Yes [provider]  leflunomide (ARAVA) 10 MG tablet TAKE 1 TABLET BY MOUTH EVERY DAY 04/19/19  Yes Deveshwar, Abel Presto, MD  levothyroxine (SYNTHROID) 75 MCG tablet Take 1 tablet (75 mcg total) by mouth daily before breakfast. 05/19/19  Yes Copland, Gay Filler, MD  meloxicam (MOBIC) 7.5 MG tablet Take 7.5 mg by mouth in the morning and at bedtime.  05/24/19  Yes [provider]  Menthol, Topical Analgesic, (BIOFREEZE EX) Apply 1 application topically daily as needed (pain).   Yes [provider]  methocarbamol (ROBAXIN) 500 MG tablet TAKE 1 TABLET (500 MG TOTAL) BY MOUTH EVERY 6 (SIX) HOURS AS NEEDED FOR MUSCLE SPASMS. 05/13/19  Yes [name], MD  Multiple Vitamin (MULTIVITAMIN) tablet Take 1 tablet by mouth daily.     Yes [provider]  omeprazole (PRILOSEC) 20 MG capsule TAKE 1 CAPSULE BY  MOUTH 2 TIMES DAILY BEFORE A MEAL. Patient taking differently: Take 20 mg by mouth daily.  03/03/19  Yes Copland, Gay Filler, MD  Probiotic Product (PROBIOTIC PO) Take 1 capsule by mouth daily.    Yes [provider]    Physical Exam: Vitals:   06/17/19 1339 06/17/19 1553 06/17/19 1554 06/17/19 1600  BP:  (!) 147/74 (!) 147/74 (!) 146/72  Pulse:   80 75  Resp:   18  Temp:      TempSrc:      SpO2:   96% 97%  Weight: 46.7 kg     Height: 5' (1.524 m)      Wt Readings from Last 3 Encounters:  06/17/19 46.7 kg  06/16/19 46.7 kg  05/31/19 48.1 kg   Body mass index is 20.12 kg/m.  General: Thinly built, not in obvious distress HENT: Normocephalic, pupils equally reacting to light and accommodation.  Mild scleral pallor but no icterus noted. Oral mucosa is slightly dry Chest:  Clear breath sounds.  Diminished breath sounds bilaterally. No crackles or wheezes.  CVS: S1 &S2 heard. No murmur.  Regular rate and rhythm. Abdomen: Soft,  lower quadrant tenderness on palpation, bowel sounds are heard.   no abdominal mass palpated.  Rectal examination was not performed by me. Extremities: No cyanosis, clubbing or edema.  Peripheral pulses are palpable.  Deformities of the hand joints from rheumatoid arthritis. Psych: Alert, awake and oriented, normal mood CNS:  No cranial nerve deficits.  Power equal in all extremities. Skin: Warm and dry.  No rashes noted.  Labs on Admission:   CBC: Recent Labs  Lab 06/17/19 1350  WBC 4.6  HGB 10.0*  HCT 32.5*  MCV 95.6  PLT 660    Basic Metabolic Panel: Recent Labs  Lab 06/17/19 1350  NA 133*  K 5.3*  CL 102  CO2 24  GLUCOSE 130*  BUN 35*  CREATININE 1.24*  CALCIUM 9.1    Liver Function Tests: Recent Labs  Lab 06/17/19 1350  AST 27  ALT 15  ALKPHOS 37*  BILITOT 0.6  PROT 6.9  ALBUMIN 3.9   No results for input(s): LIPASE, AMYLASE in the last 168 hours. No results for input(s): AMMONIA in the last 168  hours.  Cardiac Enzymes: No results for input(s): CKTOTAL, CKMB, CKMBINDEX, TROPONINI in the last 168 hours.  BNP (last 3 results) No results for input(s): BNP in the last 8760 hours.  ProBNP (last 3 results) No results for input(s): PROBNP in the last 8760 hours.  CBG: No results for input(s): GLUCAP in the last 168 hours.   Drugs of Abuse  No results found for: LABOPIA, [place], [place], [place], [facility]CU, [place]    Radiological Exams on Admission: No results found.  EKG: Not available for review  Assessment/Plan  Principal Problem:   Rectal bleed Active Problems:   Rheumatoid arthritis involving multiple joints (HCC)   Anemia of chronic disease   HTN (hypertension)  Rectal bleed likely from recent hemorrhoidal banding site/ulcer or diverticular in nature.  So far hemodynamically stable.  GI has been consulted.  Plan is for flexible sigmoidoscopy today after enema.  We will keep the patient n.p.o.  Continue to trend H&H.  Continue IV fluids.  Meloxicam on her medication list will hold.  Type and screen has been sent from the ED.  We will continue with gentle IV fluid hydration since the patient will be interviewed.  Not on anticoagulation or aspirin.  History of rheumatoid arthritis, sciatica, Sjogren's syndrome. patient takes extra strength Tylenol Robaxin and occasional hydrocodone, at home.  Continue leflunomide.  Patient takes denosumab every 6 months.  Hypo-thyroidism.  Continue Synthroid  History of cardiomyopathy.  On Coreg we will continue with that.  Echocardiogram from 04/11/2018 with LV ejection fraction of 60 to 63% with diastolic dysfunction.  Hypertension.  Continue Coreg.  Monitor blood pressure closely.  History of nonsmall cell lung cancer status post resection, in remission for 6 years.  Mild hyponatremia.  Sodium of 133.  Will be on gentle normal saline hydration overnight.  Check BMP in a.m.  Elevated creatinine 1.2.  A Month ago creatinine was  1.0.  Monitor BMP in a.m.  DVT Prophylaxis: Sequential compression device  Consultant: GI  Code Status: Partial code.  Patient is okay with CPR but no intubation  Microbiology none  Antibiotics: None  Family Communication:  Patients' condition and plan of care including tests being ordered have been discussed with the patient and the patient's daughter at bedside who indicate understanding and agree with the plan.  Status is: Inpatient  Remains inpatient appropriate because:Unsafe d/c plan, IV treatments appropriate due to intensity of illness or inability to take PO, Inpatient level of care appropriate due to severity of illness and Ongoing rectal bleed   Dispo: The patient is from: Home              Anticipated d/c is to: Home              Anticipated d/c date is: 2 days              Patient currently is not medically stable to d/c.   Severity of Illness: The appropriate patient status for this patient is INPATIENT. Inpatient status is judged to be reasonable and necessary in order to provide the required intensity of service to ensure the patient's safety. The patient's presenting symptoms, physical exam findings, and initial radiographic and laboratory data in the context of their chronic comorbidities is felt to place them at high risk for further clinical deterioration. Furthermore, it is not anticipated that the patient will be medically stable for discharge from the hospital within 2 midnights of admission. The following factors support the patient status of inpatient.   I certify that at the point of admission it is my clinical judgment that the patient will require inpatient hospital care spanning beyond 2 midnights from the point of admission due to high intensity of service, high risk for further deterioration and high frequency of surveillance required.   Signed, Flora Lipps, MD [facility] 06/17/2019

## 2019-06-17 NOTE — Anesthesia Preprocedure Evaluation (Addendum)
Anesthesia Evaluation  Patient identified by MRN, date of birth, ID band Patient awake    Reviewed: Allergy & Precautions, NPO status , Patient's Chart, lab work & pertinent test results  History of Anesthesia Complications (+) PONV  Airway Mallampati: I  TM Distance: >3 FB Neck ROM: Full    Dental  (+) Edentulous Upper, Edentulous Lower, Upper Dentures, Lower Dentures   Pulmonary  06/17/2019 SARS coronavirus NEG    H/o COVID-19 infection several months ago, did not require hospitalization  H/o non-small cell lung ca: s/p RULobectomy 2015   breath sounds clear to auscultation       Cardiovascular hypertension, Pt. on medications and Pt. on home beta blockers (-) angina+ dysrhythmias (recent event monitor: Sinus bradycardia, normal sinus rhythm, sinus tachycardia, rare PVC and brief episode of SVT.)  Rhythm:Regular Rate:Normal  '20 ECHO: EF 60-65%, mild MR, mild TR  H/o LBBB   Neuro/Psych negative neurological ROS     GI/Hepatic Neg liver ROS, hiatal hernia, GERD  Medicated and Controlled,GI bleed   Endo/Other  Hypothyroidism Raynaud's Sjogren's  Renal/GU Renal InsufficiencyRenal disease (creat 1.24)     Musculoskeletal  (+) Arthritis ,   Abdominal   Peds  Hematology  (+) Blood dyscrasia (Hb 10.0), anemia ,   Anesthesia Other Findings   Reproductive/Obstetrics                            Anesthesia Physical Anesthesia Plan  ASA: III  Anesthesia Plan: MAC   Post-op Pain Management:    Induction:   PONV Risk Score and Plan: 3 and Treatment may vary due to age or medical condition and Ondansetron  Airway Management Planned: Natural Airway and Simple Face Mask  Additional Equipment:   Intra-op Plan:   Post-operative Plan:   Informed Consent: I have reviewed the patients History and Physical, chart, labs and discussed the procedure including the risks, benefits and alternatives  for the proposed anesthesia with the patient or authorized representative who has indicated his/her understanding and acceptance.       Plan Discussed with: CRNA and Surgeon  Anesthesia Plan Comments:        Anesthesia Quick Evaluation

## 2019-06-17 NOTE — Transfer of Care (Signed)
Immediate Anesthesia Transfer of Care Note  Patient: [name]  Procedure(s) Performed: FLEXIBLE SIGMOIDOSCOPY (N/A ) SCHLEROTHERAPY OF VARICES HEMOSTASIS CLIP PLACEMENT  Patient Location: PACU and Endoscopy Unit  Anesthesia Type:MAC  Level of Consciousness: awake and alert   Airway & Oxygen Therapy: Patient Spontanous Breathing and Patient connected to face mask oxygen  Post-op Assessment: Report given to RN and Post -op Vital signs reviewed and stable  Post vital signs: Reviewed and stable  Last Vitals:  Vitals Value Taken Time  BP 138/78 06/17/19 1853  Temp 37 C 06/17/19 1853  Pulse 84 06/17/19 1853  Resp 18 06/17/19 1853  SpO2      Last Pain:  Vitals:   06/17/19 1853  TempSrc: Oral  PainSc: 8          Complications: No apparent anesthesia complications

## 2019-06-17 NOTE — Anesthesia Procedure Notes (Signed)
Procedure Name: MAC Date/Time: 06/17/2019 6:13 PM Performed by: Lissa Morales, CRNA Pre-anesthesia Checklist: Patient identified, Emergency Drugs available, Suction available, Patient being monitored and Timeout performed Patient Re-evaluated:Patient Re-evaluated prior to induction Oxygen Delivery Method: Simple face mask Placement Confirmation: positive ETCO2

## 2019-06-17 NOTE — H&P (View-Only) (Signed)
Referring Provider:  Dr. Maryan Rued, EDP Primary Care Physician:  Darreld Mclean, MD Primary Gastroenterologist:  Dr. Bryan Lemma  Reason for Consultation:  GI bleed  HPI: [name] is a 86 y.o. female with a history of GERD, Sjogren's syndrome, RA, Raynaud's, chronic constipation, discoid lupus, LBBB, Mitchell's disease, non-small cell cancer of right lung in 2015, OA, follows in the GI clinic for symptomatic hemorrhoids (hematochezia, perianal irritation), along with GERD/hiatal hernia and constipation.  Follows with Dr. Bryan Lemma and had hemorrhoid banding as follows:  -03/21/2019: Successful banding of the RA hemorrhoid -04/26/2019: Successful banding of the LL hemorrhoid -05/31/2019: Presented for hemorrhoid banding #3  Endoscopic History: -Colonoscopy (04/2018, Dr.Mansouraty): Grade 2 internal hemorrhoids, 3 small tubular adenomas -EGD (03/2018, Dr. Havery Moros): 7 cm HH, tortuous esophagus, localized nodularity at [place] (biopsy: H. pylori gastritis-treated as inpatient), Normal stomach/duodenum  She called our office today reporting rectal bleeding.  Says it came on her suddenly with an episode around 70 AM an episode around 62 PM.  When she alerted our office they advised her to present to the emergency department.  She since had another episode here in the emergency department.  It is described as large volume dark red rectal bleeding.  Felt somewhat dizzy upon presentation to the emergency department.  No abdominal pain, but gets cramping or rectal fullness and a sensation to move her bowels prior to the passing of blood.  Hemoglobin is 10.0 g.  Her baseline tends to run in the 10 g range.  BUN and creatinine are about stable at her baseline.   Past Medical History:  Diagnosis Date  . Arthralgia of multiple joints    followed by dr Gerilyn Nestle  . Arthritis   . Cardiomyopathy ([place])   . Chronic constipation   . Chronic inflammatory arthritis    rhemotolgist-  dr a. Gerilyn Nestle  [facility])  . Dry eyes    eye drops used   . GERD (gastroesophageal reflux disease)   . H/O discoid lupus erythematosus   . Hiatal hernia   . History of colon polyps   . Hypothyroidism   . Iron deficiency anemia   . LBBB (left bundle branch block) 2010  . Mitchell's disease (erythromelalgia) [facility])    neurologist-  dr patel  . Nocturia   . Non-small cell cancer of right lung [facility]) surgeon-- dr gerhardt/  oncologist-  dr Julien Nordmann--- per lov notes no recurrence/   11-18-2017 per pt denies any symptoms   dx 2015--  Stage IIA (T2b,N0,M0) , +EGFR  mutation in exon 21, non-small cell adenocarcinoma right upper lobe---  s/p  Right upper lobectomy , right middley wedge resection and node dissection---  no chemo or radiation therapy  . OA (osteoarthritis)    hands  . Osteoporosis   . PONV (postoperative nausea and vomiting)    likes phenergan  . Raynaud's phenomenon 1965  . Renal insufficiency   . Rheumatoid arthritis ([place])   . Sciatica   . Scoliosis   . Sjogren's syndrome [facility])     Past Surgical History:  Procedure Laterality Date  . ANTERIOR HIP REVISION Right 11/27/2017   Procedure: RIGHT HIP ACETABULAR REVISION;  Surgeon: [name], MD;  Location: [facility];  Service: Orthopedics;  Laterality: Right;  . ANTERIOR HIP REVISION Right 01/24/2018   Procedure: OPEN REDUCTION OF DISLOCATED ANTERIOR HIP WITH REVISION OF LINER AND HIP BALL;  Surgeon: [name], MD;  Location: [facility];  Service: Orthopedics;  Laterality: Right;  . APPENDECTOMY  1950s  . BIOPSY  04/14/2018   Procedure: BIOPSY;  Surgeon: Yetta Flock, MD;  Location: [place];  Service: Gastroenterology;;  . BIOPSY  04/16/2018   Procedure: BIOPSY;  Surgeon: Irving Copas., MD;  Location: [facility];  Service: Gastroenterology;;  . CARDIOVASCULAR STRESS TEST  12/2008    mild fixed basal to mid septal perfusion defect felt likely due to artifact from LBBB, no ischemia, EF 58%  .  COLONOSCOPY    . COLONOSCOPY WITH PROPOFOL N/A 04/16/2018   Procedure: COLONOSCOPY WITH PROPOFOL;  Surgeon: Rush Landmark Telford Nab., MD;  Location: [place];  Service: Gastroenterology;  Laterality: N/A;  . ESOPHAGOGASTRODUODENOSCOPY (EGD) WITH PROPOFOL N/A 04/14/2018   Procedure: ESOPHAGOGASTRODUODENOSCOPY (EGD) WITH PROPOFOL;  Surgeon: Yetta Flock, MD;  Location: [place];  Service: Gastroenterology;  Laterality: N/A;  . FEMORAL-POPLITEAL BYPASS GRAFT Right 04/10/2018   Procedure: REPAIR RIGHT FEMORAL ARTERY PSEUDOANEURYSM, RETROPERITONEAL EXPOSURE OF ILIAC ARTERY, RIGHT POPLITEAL EMBOLECTOMY;  Surgeon: Angelia Mould, MD;  Location: [place];  Service: Vascular;  Laterality: Right;  . LYMPH NODE DISSECTION Right 06/07/2013   Procedure: LYMPH NODE DISSECTION;  Surgeon: [name], MD;  Location: [place];  Service: Thoracic;  Laterality: Right;  . PATCH ANGIOPLASTY Right 04/10/2018   Procedure: PATCH  ANGIOPLASTY OF RIGHT FEMORAL ARTERY USING BOVINE PATCH, PATCH ANGIOPLASTY OF RIGHT POPLITEAL ARTERY USING BOVINE PATCH;  Surgeon: Angelia Mould, MD;  Location: [place];  Service: Vascular;  Laterality: Right;  . [place]   "large incision from chest to up to shoulder, the nerves were tied together, for raynaud's  . THORACOTOMY  06/07/2013   Procedure: MINI/LIMITED THORACOTOMY; right middle lobe wedge resection;  Surgeon: [name], MD;  Location: [place];  Service: Thoracic;;  . TONSILLECTOMY  child  . TOTAL ABDOMINAL HYSTERECTOMY  1980's    W/ BSO  . TOTAL HIP ARTHROPLASTY Right 04/28/2014   Procedure: RIGHT TOTAL HIP ARTHROPLASTY ANTERIOR APPROACH;  Surgeon: [name], MD;  Location: [facility];  Service: Orthopedics;  Laterality: Right;  . TRANSTHORACIC ECHOCARDIOGRAM  12/11/2008   ef 93-90%, grade 1 diastolic dysfunction/  mild LAE/  mild AR and MR/  trivial TR  . VIDEO ASSISTED THORACOSCOPY (VATS)/WEDGE RESECTION Right 06/07/2013    Procedure: VIDEO ASSISTED THORACOSCOPY (VATS)/right upper lobectomy, On Q;  Surgeon: [name], MD;  Location: [place];  Service: Thoracic;  Laterality: Right;  Marland Kitchen VIDEO BRONCHOSCOPY N/A 06/07/2013   Procedure: VIDEO BRONCHOSCOPY;  Surgeon: [name], MD;  Location[facility];  Service: Thoracic;  Laterality: N/A;  . VIDEO BRONCHOSCOPY WITH ENDOBRONCHIAL NAVIGATION N/A 05/04/2013   Procedure: VIDEO BRONCHOSCOPY WITH ENDOBRONCHIAL NAVIGATION;  Surgeon: [name], MD;  Location: [place];  Service: Thoracic;  Laterality: N/A;    Prior to Admission medications   Medication Sig Start Date End Date Taking? Authorizing Provider  Acetaminophen (TYLENOL EXTRA STRENGTH PO) Take 1-2 tablets by mouth every 6 (six) hours as needed (pain).    Yes [provider]  Biotin 1000 MCG tablet Take 1,000 mcg by mouth daily.    Yes [provider]  Calcium Carbonate-Vitamin D (CALCIUM 500 + D) 500-125 MG-UNIT TABS Take 1 tablet by mouth daily.    Yes [provider]  carvedilol (COREG) 3.125 MG tablet TAKE 1 TABLET (3.125 MG TOTAL) BY MOUTH 2 (TWO) TIMES DAILY. 06/13/19  Yes Lelon Perla, MD  denosumab (PROLIA) 60 MG/ML SOSY injection Inject 60 mg into the skin every 6 (six)  months. Last injection 04-22-2019   Yes [provider]  Diclofenac Sodium 3 % GEL Apply a pea sized amount to affected joint twice daily as needed. Patient taking differently: Apply 1 application topically 2 (two) times daily as needed (pain). Apply a pea sized amount to affected joint twice daily as needed. 10/26/18  Yes Deveshwar, Abel Presto, MD  docusate sodium (STOOL SOFTENER) 100 MG capsule Take 100 mg by mouth 2 (two) times daily.     Yes [provider]  gabapentin (NEURONTIN) 300 MG capsule TAKE '300MG'$  IN THE MORNING, '300MG'$  IN THE AFTERNOON, AND '600MG'$  AT BEDTIME. 01/04/19  Yes Copland, Gay Filler, MD  Glucosamine-Chondroit-Vit C-Mn (GLUCOSAMINE CHONDR 1500 COMPLX PO) Take 1 capsule by  mouth daily.    Yes [provider]  leflunomide (ARAVA) 10 MG tablet TAKE 1 TABLET BY MOUTH EVERY DAY 04/19/19  Yes Deveshwar, Abel Presto, MD  levothyroxine (SYNTHROID) 75 MCG tablet Take 1 tablet (75 mcg total) by mouth daily before breakfast. 05/19/19  Yes Copland, Gay Filler, MD  meloxicam (MOBIC) 7.5 MG tablet Take 7.5 mg by mouth in the morning and at bedtime.  05/24/19  Yes [provider]  Menthol, Topical Analgesic, (BIOFREEZE EX) Apply 1 application topically daily as needed (pain).   Yes [provider]  methocarbamol (ROBAXIN) 500 MG tablet TAKE 1 TABLET (500 MG TOTAL) BY MOUTH EVERY 6 (SIX) HOURS AS NEEDED FOR MUSCLE SPASMS. 05/13/19  Yes [name], MD  Multiple Vitamin (MULTIVITAMIN) tablet Take 1 tablet by mouth daily.     Yes [provider]  omeprazole (PRILOSEC) 20 MG capsule TAKE 1 CAPSULE BY MOUTH 2 TIMES DAILY BEFORE A MEAL. Patient taking differently: Take 20 mg by mouth daily.  03/03/19  Yes Copland, Gay Filler, MD  Probiotic Product (PROBIOTIC PO) Take 1 capsule by mouth daily.    Yes [provider]    No current facility-administered medications for this encounter.   Current Outpatient Medications  Medication Sig Dispense Refill  . Acetaminophen (TYLENOL EXTRA STRENGTH PO) Take 1-2 tablets by mouth every 6 (six) hours as needed (pain).     . Biotin 1000 MCG tablet Take 1,000 mcg by mouth daily.     . Calcium Carbonate-Vitamin D (CALCIUM 500 + D) 500-125 MG-UNIT TABS Take 1 tablet by mouth daily.     . carvedilol (COREG) 3.125 MG tablet TAKE 1 TABLET (3.125 MG TOTAL) BY MOUTH 2 (TWO) TIMES DAILY. 180 tablet 3  . denosumab (PROLIA) 60 MG/ML SOSY injection Inject 60 mg into the skin every 6 (six) months. Last injection 04-22-2019    . Diclofenac Sodium 3 % GEL Apply a pea sized amount to affected joint twice daily as needed. (Patient taking differently: Apply 1 application topically 2 (two) times daily as needed (pain). Apply a  pea sized amount to affected joint twice daily as needed.) 100 g 2  . docusate sodium (STOOL SOFTENER) 100 MG capsule Take 100 mg by mouth 2 (two) times daily.      Marland Kitchen gabapentin (NEURONTIN) 300 MG capsule TAKE '300MG'$  IN THE MORNING, '300MG'$  IN THE AFTERNOON, AND '600MG'$  AT BEDTIME. 360 capsule 1  . Glucosamine-Chondroit-Vit C-Mn (GLUCOSAMINE CHONDR 1500 COMPLX PO) Take 1 capsule by mouth daily.     Marland Kitchen leflunomide (ARAVA) 10 MG tablet TAKE 1 TABLET BY MOUTH EVERY DAY 90 tablet 0  . levothyroxine (SYNTHROID) 75 MCG tablet Take 1 tablet (75 mcg total) by mouth daily before breakfast. 30 tablet 5  . meloxicam (MOBIC) 7.5 MG  tablet Take 7.5 mg by mouth in the morning and at bedtime.     . Menthol, Topical Analgesic, (BIOFREEZE EX) Apply 1 application topically daily as needed (pain).    . methocarbamol (ROBAXIN) 500 MG tablet TAKE 1 TABLET (500 MG TOTAL) BY MOUTH EVERY 6 (SIX) HOURS AS NEEDED FOR MUSCLE SPASMS. 40 tablet 0  . Multiple Vitamin (MULTIVITAMIN) tablet Take 1 tablet by mouth daily.      Marland Kitchen omeprazole (PRILOSEC) 20 MG capsule TAKE 1 CAPSULE BY MOUTH 2 TIMES DAILY BEFORE A MEAL. (Patient taking differently: Take 20 mg by mouth daily. ) 180 capsule 1  . Probiotic Product (PROBIOTIC PO) Take 1 capsule by mouth daily.       Allergies as of 06/17/2019 - Review Complete 06/17/2019  Allergen Reaction Noted  . Amlodipine Rash 08/31/2015  . Prochlorperazine edisylate Anaphylaxis 11/29/2007  . Aspirin Other (See Comments) 10/01/2009  . Cymbalta [duloxetine hcl] Diarrhea, Nausea And Vomiting, and Other (See Comments) 11/18/2017  . Pamelor [nortriptyline hcl] Diarrhea and Nausea Only 09/25/2016    Family History  Problem Relation Age of Onset  . Coronary artery disease Father   . Colon cancer Father   . Diabetes Father   . Cancer Father        colon  . Other Mother 37       MVA  . Healthy Sister   . Healthy Brother   . Healthy Daughter   . Hypothyroidism Daughter   . Other Brother         killed in war  . Pneumonia Sister   . Healthy Daughter   . Esophageal cancer Neg Hx   . Kidney disease Neg Hx   . Liver disease Neg Hx     Social History   Socioeconomic History  . Marital status: Widowed    Spouse name: Not on file  . Number of children: 2  . Years of education: Not on file  . Highest education level: Not on file  Occupational History  . Occupation: n/a  Tobacco Use  . Smoking status: Never Smoker  . Smokeless tobacco: Never Used  Substance and Sexual Activity  . Alcohol use: Not Currently  . Drug use: Never  . Sexual activity: Not Currently    Birth control/protection: Surgical  Other Topics Concern  . Not on file  Social History Narrative   Lives with husband, daughter and grandchild local.   Highest level of education:  masters in Materials engineer   Social Determinants of Health   Financial Resource Strain:   . Difficulty of Paying Living Expenses:   Food Insecurity:   . Worried About Charity fundraiser in the Last Year:   . Arboriculturist in the Last Year:   Transportation Needs:   . Film/video editor (Medical):   Marland Kitchen Lack of Transportation (Non-Medical):   Physical Activity:   . Days of Exercise per Week:   . Minutes of Exercise per Session:   Stress:   . Feeling of Stress :   Social Connections:   . Frequency of Communication with Friends and Family:   . Frequency of Social Gatherings with Friends and Family:   . Attends Religious Services:   . Active Member of Clubs or Organizations:   . Attends Archivist Meetings:   Marland Kitchen Marital Status:   Intimate Partner Violence:   . Fear of Current or Ex-Partner:   . Emotionally Abused:   Marland Kitchen Physically Abused:   .  Sexually Abused:     Review of Systems: ROS is O/W negative except as mentioned.  Physical Exam: Vital signs in last 24 hours: Temp:  [98.1 F (36.7 C)] 98.1 F (36.7 C) (04/30 1336) Pulse Rate:  [75-97] 75 (04/30 1600) Resp:  [16-18] 18 (04/30  1554) BP: (146-167)/(72-87) 146/72 (04/30 1600) SpO2:  [96 %-98 %] 97 % (04/30 1600) Weight:  [46.7 kg] 46.7 kg (04/30 1339)   General:  Alert, Well-developed, well-nourished, pleasant and cooperative in NAD Head:  Normocephalic and atraumatic. Eyes:  Sclera clear, no icterus.  Conjunctiva pink. Ears:  Normal auditory acuity. Mouth:  No deformity or lesions.   Lungs:  Clear throughout to auscultation.  No wheezes, crackles, or rhonchi.  Heart:  Regular rate and rhythm; no murmurs, clicks, rubs, or gallops. Abdomen:  Soft, non-distended.  BS present.  Non-tender.  Rectal:  Deferred.  Msk:  Symmetrical without gross deformities. Pulses:  Normal pulses noted. Extremities:  Without clubbing or edema. Neurologic:  Alert and oriented x 4;  grossly normal neurologically. Skin:  Intact without significant lesions or rashes. Psych:  Alert and cooperative. Normal mood and affect.  Lab Results: Recent Labs    06/17/19 1350  WBC 4.6  HGB 10.0*  HCT 32.5*  PLT 192   BMET Recent Labs    06/17/19 1350  NA 133*  K 5.3*  CL 102  CO2 24  GLUCOSE 130*  BUN 35*  CREATININE 1.24*  CALCIUM 9.1   LFT Recent Labs    06/17/19 1350  PROT 6.9  ALBUMIN 3.9  AST 27  ALT 15  ALKPHOS 37*  BILITOT 0.6   IMPRESSION:  *GI bleed:  Differential includes bleeding at recent hemorrhoid banding site/ulcer vs diverticular vs less likely UGIB.  Right now hemoglobin is stable, but she has had 3 episodes of large-volume rectal bleeding.  PLAN: -Flex sig today with Dr. Bryan Lemma with possible intervention if it is at the hemorrhoid banding site. -Admit to hospital at least overnight for observation.  Monitor hemoglobin and transfuse as needed. -If flex sig negative then will likely need nuc med bleeding scan versus CTA to locate bleeding source.   [name]. [name]  06/17/2019, 4:17 PM

## 2019-06-17 NOTE — ED Provider Notes (Signed)
[facility] DEPT Provider Note   CSN: [id number] Arrival date & time: 06/17/19  1328     History Chief Complaint  Patient presents with  . Rectal Bleeding    [name] is a 86 y.o. female.  Patient is an 86 year old female with multiple medical problems including cardiomyopathy, chronic constipation status post banding of hemorrhoids most recent banding was approximately 1 week ago, non-small cell right lung cancer status post resection and not currently receiving therapy, Sjogren's syndrome and rheumatoid arthritis who is not currently on any anticoagulation presenting today with 3 bloody bowel movements.  Patient reports that she had the banding done 1 week ago and was doing well and this morning around 11:00 she started to have a lot of pressure in her rectum and some cramping in her lower abdomen and then passed a large amount of bright blood.  She then had a second episode around noon and she spoke with her gastroenterologist and they reported that she should come to the emergency room.  Patient waited for 2 hours in the waiting room and stated just prior to being bedded she was starting to feel lightheaded and dizzy and then had a third bloody bowel movement in the room which is not particularly bright but dark red blood.  She has no rectal pain or abdominal pain at this time.  She stated earlier she felt slightly nauseated but has not had any vomiting and reports does not feel nauseated now.  The history is provided by the patient.  Rectal Bleeding Quality:  Bright red and maroon Amount:  Copious Duration:  6 hours Timing:  Intermittent Chronicity:  New Context: defecation   Context: not diarrhea, not rectal pain and not spontaneously   Similar prior episodes: no        Past Medical History:  Diagnosis Date  . Arthralgia of multiple joints    followed by dr Gerilyn Nestle  . Arthritis   . Cardiomyopathy ([place])   . Chronic constipation   .  Chronic inflammatory arthritis    rhemotolgist-  dr a. Gerilyn Nestle [facility])  . Dry eyes    eye drops used   . GERD (gastroesophageal reflux disease)   . H/O discoid lupus erythematosus   . Hiatal hernia   . History of colon polyps   . Hypothyroidism   . Iron deficiency anemia   . LBBB (left bundle branch block) 2010  . Mitchell's disease (erythromelalgia) [facility])    neurologist-  dr patel  . Nocturia   . Non-small cell cancer of right lung [facility]) surgeon-- dr gerhardt/  oncologist-  dr Julien Nordmann--- per lov notes no recurrence/   11-18-2017 per pt denies any symptoms   dx 2015--  Stage IIA (T2b,N0,M0) , +EGFR  mutation in exon 21, non-small cell adenocarcinoma right upper lobe---  s/p  Right upper lobectomy , right middley wedge resection and node dissection---  no chemo or radiation therapy  . OA (osteoarthritis)    hands  . Osteoporosis   . PONV (postoperative nausea and vomiting)    likes phenergan  . Raynaud's phenomenon 1965  . Renal insufficiency   . Rheumatoid arthritis ([place])   . Sciatica   . Scoliosis   . Sjogren's syndrome [facility])     Patient Active Problem List   Diagnosis Date Noted  . Popliteal artery occlusion, right ([place]) 04/10/2018  . HTN (hypertension) 04/10/2018  . Femoral artery pseudo-aneurysm, right ([place]) 04/09/2018  . Anemia of chronic disease 02/24/2018  .  Unstable right hip arthroplasty 01/24/2018  . History of revision of total replacement of right hip joint 01/24/2018  . Hypovolemic shock ([place])   . Hyperkalemia   . Hyponatremia   . 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 Status post revision of total hip 11/27/2017  . Elevated cholesterol 10/11/2015  . 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[place]) 10/04/2014  . Bilateral leg edema 08/01/2014  . Elevated BP 08/01/2014  . Rheumatoid arthritis involving multiple joints ([place]) 05/30/2014  . Status post total replacement of right hip 04/28/2014  . Long-term use of high-risk medication 11/11/2013  .  Symptomatic anemia 11/11/2013  . Neuropathic pain 07/07/2013  . Constipation due to pain medication 07/07/2013  . Protein-calorie malnutrition, severe ([place]) 06/08/2013  . Lung cancer, Right upper lobe 05/08/2013  . Sciatica of right side 08/13/2011  . Osteoporosis 03/07/2010  . PARESTHESIA 03/07/2010  . CT, CHEST, ABNORMAL 12/18/2008  . ABNORMAL ECHOCARDIOGRAM 12/14/2008  . [place] SYNDROME 11/29/2008  . HYPOGLYCEMIA 06/29/2006  . RAYNAUD'S DISEASE 06/29/2006    Past Surgical History:  Procedure Laterality Date  . ANTERIOR HIP REVISION Right 11/27/2017   Procedure: RIGHT HIP ACETABULAR REVISION;  Surgeon: [name], MD;  Location: [facility];  Service: Orthopedics;  Laterality: Right;  . ANTERIOR HIP REVISION Right 01/24/2018   Procedure: OPEN REDUCTION OF DISLOCATED ANTERIOR HIP WITH REVISION OF LINER AND HIP BALL;  Surgeon: [name], MD;  Location: [facility];  Service: Orthopedics;  Laterality: Right;  . APPENDECTOMY  1950s  . BIOPSY  04/14/2018   Procedure: BIOPSY;  Surgeon: Yetta Flock, MD;  Location: [place];  Service: Gastroenterology;;  . BIOPSY  04/16/2018   Procedure: BIOPSY;  Surgeon: Irving Copas., MD;  Location: [facility] ENDOSCOPY;  Service: Gastroenterology;;  . CARDIOVASCULAR STRESS TEST  12/2008    mild fixed basal to mid septal perfusion defect felt likely due to artifact from LBBB, no ischemia, EF 58%  . COLONOSCOPY    . COLONOSCOPY WITH PROPOFOL N/A 04/16/2018   Procedure: COLONOSCOPY WITH PROPOFOL;  Surgeon: Rush Landmark Telford Nab., MD;  Location: [place];  Service: Gastroenterology;  Laterality: N/A;  . ESOPHAGOGASTRODUODENOSCOPY (EGD) WITH PROPOFOL N/A 04/14/2018   Procedure: ESOPHAGOGASTRODUODENOSCOPY (EGD) WITH PROPOFOL;  Surgeon: Yetta Flock, MD;  Location: [place];  Service: Gastroenterology;  Laterality: N/A;  . FEMORAL-POPLITEAL BYPASS GRAFT Right 04/10/2018   Procedure: REPAIR RIGHT FEMORAL ARTERY  PSEUDOANEURYSM, RETROPERITONEAL EXPOSURE OF ILIAC ARTERY, RIGHT POPLITEAL EMBOLECTOMY;  Surgeon: Angelia Mould, MD;  Location: [place];  Service: Vascular;  Laterality: Right;  . LYMPH NODE DISSECTION Right 06/07/2013   Procedure: LYMPH NODE DISSECTION;  Surgeon: [name], MD;  Location: [place];  Service: Thoracic;  Laterality: Right;  . PATCH ANGIOPLASTY Right 04/10/2018   Procedure: PATCH  ANGIOPLASTY OF RIGHT FEMORAL ARTERY USING BOVINE PATCH, PATCH ANGIOPLASTY OF RIGHT POPLITEAL ARTERY USING BOVINE PATCH;  Surgeon: Angelia Mould, MD;  Location: [place];  Service: Vascular;  Laterality: Right;  . [place]   "large incision from chest to up to shoulder, the nerves were tied together, for raynaud's  . THORACOTOMY  06/07/2013   Procedure: MINI/LIMITED THORACOTOMY; right middle lobe wedge resection;  Surgeon: [name], MD;  Location: [place];  Service: Thoracic;;  . TONSILLECTOMY  child  . TOTAL ABDOMINAL HYSTERECTOMY  1980's    W/ BSO  . TOTAL HIP ARTHROPLASTY Right 04/28/2014   Procedure: RIGHT TOTAL HIP ARTHROPLASTY ANTERIOR APPROACH;  Surgeon: [name], MD;  Location:  [facility];  Service: Orthopedics;  Laterality: Right;  . TRANSTHORACIC ECHOCARDIOGRAM  12/11/2008   ef 63-01%, grade 1 diastolic dysfunction/  mild LAE/  mild AR and MR/  trivial TR  . VIDEO ASSISTED THORACOSCOPY (VATS)/WEDGE RESECTION Right 06/07/2013   Procedure: VIDEO ASSISTED THORACOSCOPY (VATS)/right upper lobectomy, On Q;  Surgeon: [name], MD;  Location: [place];  Service: Thoracic;  Laterality: Right;  [place] VIDEO BRONCHOSCOPY N/A 06/07/2013   Procedure: VIDEO BRONCHOSCOPY;  Surgeon: [name], MD;  Location: [facility] OR;  Service: Thoracic;  Laterality: N/A;  . VIDEO BRONCHOSCOPY WITH ENDOBRONCHIAL NAVIGATION N/A 05/04/2013   Procedure: VIDEO BRONCHOSCOPY WITH ENDOBRONCHIAL NAVIGATION;  Surgeon: [name], MD;  Location: [facility] OR;  Service: Thoracic;   Laterality: N/A;     OB History    Gravida  4   Para  2   Term      Preterm      AB  2   Living  2     SAB  2   TAB      Ectopic      Multiple      Live Births              Family History  Problem Relation Age of Onset  . Coronary artery disease Father   . Colon cancer Father   . Diabetes Father   . Cancer Father        colon  . Other Mother 5       MVA  . Healthy Sister   . Healthy Brother   . Healthy Daughter   . Hypothyroidism Daughter   . Other Brother        killed in war  . Pneumonia Sister   . Healthy Daughter   . Esophageal cancer Neg Hx   . Kidney disease Neg Hx   . Liver disease Neg Hx     Social History   Tobacco Use  . Smoking status: Never Smoker  . Smokeless tobacco: Never Used  Substance Use Topics  . Alcohol use: Not Currently  . Drug use: Never    Home Medications Prior to Admission medications   Medication Sig Start Date End Date Taking? Authorizing Provider  Acetaminophen (TYLENOL EXTRA STRENGTH PO) Take 2 tablets by mouth every 6 (six) hours as needed.    [provider]  Biotin 1000 MCG tablet Take 1,000 mcg by mouth daily.     [provider]  Calcium Carbonate-Vitamin D (CALCIUM 500 + D) 500-125 MG-UNIT TABS Take 1 tablet by mouth daily.     [provider]  carvedilol (COREG) 3.125 MG tablet TAKE 1 TABLET (3.125 MG TOTAL) BY MOUTH 2 (TWO) TIMES DAILY. 06/13/19   Lelon Perla, MD  denosumab (PROLIA) 60 MG/ML SOSY injection Inject 60 mg into the skin every 6 (six) months. Last injection 04-22-2019    [provider]  Diclofenac Sodium 3 % GEL Apply a pea sized amount to affected joint twice daily as needed. 10/26/18   Bo Merino, MD  docusate sodium (STOOL SOFTENER) 100 MG capsule Take 100 mg by mouth 2 (two) times daily.      [provider]  gabapentin (NEURONTIN) 300 MG capsule TAKE '300MG'$  IN THE MORNING, '300MG'$  IN THE AFTERNOON, AND '600MG'$  AT BEDTIME. 01/04/19    Copland, Gay Filler, MD  Glucosamine-Chondroit-Vit C-Mn (GLUCOSAMINE CHONDR 1500 COMPLX PO) Take 1 capsule by mouth daily.     [provider]  leflunomide (ARAVA) 10 MG  tablet TAKE 1 TABLET BY MOUTH EVERY DAY 04/19/19   Bo Merino, MD  levothyroxine (SYNTHROID) 75 MCG tablet Take 1 tablet (75 mcg total) by mouth daily before breakfast. 05/19/19   Copland, Gay Filler, MD  meloxicam (MOBIC) 7.5 MG tablet  05/24/19   [provider]  methocarbamol (ROBAXIN) 500 MG tablet TAKE 1 TABLET (500 MG TOTAL) BY MOUTH EVERY 6 (SIX) HOURS AS NEEDED FOR MUSCLE SPASMS. 05/13/19   [name], MD  Multiple Vitamin (MULTIVITAMIN) tablet Take 1 tablet by mouth daily.      [provider]  omeprazole (PRILOSEC) 20 MG capsule TAKE 1 CAPSULE BY MOUTH 2 TIMES DAILY BEFORE A MEAL. 03/03/19   Copland, Gay Filler, MD  Probiotic Product (PROBIOTIC PO) Take by mouth daily.    [provider]    Allergies    Amlodipine, Prochlorperazine edisylate, Aspirin, Cymbalta [duloxetine hcl], and Pamelor [nortriptyline hcl]  Review of Systems   Review of Systems  Gastrointestinal: Positive for hematochezia.  All other systems reviewed and are negative.   Physical Exam Updated Vital Signs BP (!) 167/87 (BP Location: Left Arm)   Pulse 97   Temp 98.1 F (36.7 C) (Oral)   Resp 16   Ht 5' (1.524 m)   Wt 46.7 kg   SpO2 98%   BMI 20.12 kg/m   Physical Exam Vitals and nursing note reviewed.  Constitutional:      General: She is not in acute distress.    Appearance: Normal appearance. She is well-developed and normal weight.  HENT:     Head: Normocephalic and atraumatic.  Eyes:     Pupils: Pupils are equal, round, and reactive to light.     Comments: Pale conjunctive a  Cardiovascular:     Rate and Rhythm: Normal rate and regular rhythm.     Pulses: Normal pulses.     Heart sounds: Murmur present. No friction rub.  Pulmonary:     Effort: Pulmonary effort is normal.      Breath sounds: Normal breath sounds. No wheezing or rales.  Abdominal:     General: Bowel sounds are normal. There is no distension.     Palpations: Abdomen is soft.     Tenderness: There is no abdominal tenderness. There is no guarding or rebound.  Genitourinary:    Rectum: Normal.     Comments: No hemorrhoids, or oozing blood from the rectum.  However bedside potty with a large amount of dark blood with some small amounts of stool present Musculoskeletal:        General: No tenderness. Normal range of motion.     Right lower leg: No edema.     Left lower leg: No edema.     Comments: No edema  Skin:    General: Skin is warm and dry.     Coloration: Skin is pale.     Findings: No rash.  Neurological:     General: No focal deficit present.     Mental Status: She is alert and oriented to person, place, and time. Mental status is at baseline.     Cranial Nerves: No cranial nerve deficit.  Psychiatric:        Mood and Affect: Mood normal.        Behavior: Behavior normal.        Thought Content: Thought content normal.      ED Results / Procedures / Treatments   Labs (all labs ordered are listed, but only abnormal results are displayed)  Labs Reviewed  COMPREHENSIVE METABOLIC PANEL - Abnormal; Notable for the following components:      Result Value   Sodium 133 (*)    Potassium 5.3 (*)    Glucose, Bld 130 (*)    BUN 35 (*)    Creatinine, Ser 1.24 (*)    Alkaline Phosphatase 37 (*)    GFR calc non Af Amer 39 (*)    GFR calc Af Amer 46 (*)    All other components within normal limits  CBC - Abnormal; Notable for the following components:   RBC 3.40 (*)    Hemoglobin 10.0 (*)    HCT 32.5 (*)    All other components within normal limits  RESPIRATORY PANEL BY RT PCR (FLU A&B, COVID)  POC OCCULT BLOOD, ED  TYPE AND SCREEN    EKG None  Radiology No results found.  Procedures Procedures (including critical care time)  Medications Ordered in ED Medications - No data  to display  ED Course  I have reviewed the triage vital signs and the nursing notes.  Pertinent labs & imaging results that were available during my care of the patient were reviewed by me and considered in my medical decision making (see chart for details).    MDM Rules/Calculators/A&P                      Elderly female presenting today with GI bleeding complaint of some dizziness after arrival to the emergency room.  She denies any shortness of breath or chest pain.  She has no abdominal pain on exam.  Patient had a bowel movement just prior to my arrival in the room and there is a large amount of dark red blood present in the bedside commode.  Rectum is normal in appearance and nontender.  Patient's labs are significant for elevated BUN of 35 stable creatinine and currently stable hemoglobin of 10 which is her baseline.  Patient was typed and screened and Covid test was sent.  Concern for possible complication with recent hemorrhoid banding however also concern for lower GI bleed higher than hemorrhoids given patient's cramping and then defecation with large amount of dark blood.  Discussed with patient the possibility of needing a blood transfusion in the future which she is okay with but at this time does not need one currently.  Will talk to [facility] to come and evaluate the patient.  At this time she does not take any anticoagulation.  Vital signs are currently stable.  While lying she is asymptomatic and denies any lightheaded or dizziness currently.  4:22 PM Spoke with GI who will come see the patient.  We will also admit for ongoing observation for recurrent hemoglobin and patient may need a GI procedure if bleeding continues.  She remained stable at this time.  MDM Number of Diagnoses or Management Options   Amount and/or Complexity of Data Reviewed Clinical lab tests: ordered and reviewed Decide to obtain previous medical records or to obtain history from someone other than the  patient: yes Obtain history from someone other than the patient: yes Review and summarize past medical records: yes Discuss the patient with other providers: yes Independent visualization of images, tracings, or specimens: yes  Risk of Complications, Morbidity, and/or Mortality Presenting problems: high Diagnostic procedures: low Management options: low  Patient Progress Patient progress: stable  Final Clinical Impression(s) / ED Diagnoses Final diagnoses:  Lower GI bleed    Rx / DC Orders ED Discharge Orders  None       Blanchie Dessert, MD 06/17/19 1627

## 2019-06-17 NOTE — ED Triage Notes (Signed)
Patient states she has had bright red rectal bleeding x 3 today. Patient denies any pain.

## 2019-06-18 DIAGNOSIS — K625 Hemorrhage of anus and rectum: Secondary | ICD-10-CM | POA: Diagnosis not present

## 2019-06-18 DIAGNOSIS — K641 Second degree hemorrhoids: Secondary | ICD-10-CM | POA: Diagnosis not present

## 2019-06-18 DIAGNOSIS — D62 Acute posthemorrhagic anemia: Secondary | ICD-10-CM | POA: Diagnosis not present

## 2019-06-18 DIAGNOSIS — K626 Ulcer of anus and rectum: Secondary | ICD-10-CM | POA: Diagnosis not present

## 2019-06-18 LAB — COMPREHENSIVE METABOLIC PANEL
ALT: 12 U/L (ref 0–44)
AST: 21 U/L (ref 15–41)
Albumin: 2.7 g/dL — ABNORMAL LOW (ref 3.5–5.0)
Alkaline Phosphatase: 22 U/L — ABNORMAL LOW (ref 38–126)
Anion gap: 5 (ref 5–15)
BUN: 32 mg/dL — ABNORMAL HIGH (ref 8–23)
CO2: 22 mmol/L (ref 22–32)
Calcium: 7.5 mg/dL — ABNORMAL LOW (ref 8.9–10.3)
Chloride: 106 mmol/L (ref 98–111)
Creatinine, Ser: 0.92 mg/dL (ref 0.44–1.00)
GFR calc Af Amer: >60 mL/min (ref >60)
GFR calc non Af Amer: 56 mL/min — ABNORMAL LOW (ref >60)
Glucose, Bld: 86 mg/dL (ref 70–99)
Potassium: 5.5 mmol/L — ABNORMAL HIGH (ref 3.5–5.1)
Sodium: 133 mmol/L — ABNORMAL LOW (ref 135–145)
Total Bilirubin: 0.4 mg/dL (ref 0.3–1.2)
Total Protein: 4.7 g/dL — ABNORMAL LOW (ref 6.5–8.1)

## 2019-06-18 LAB — CBC
HCT: 21.1 % — ABNORMAL LOW (ref 36.0–46.0)
Hemoglobin: 6.3 g/dL — CL (ref 12.0–15.0)
MCH: 29.9 pg (ref 26.0–34.0)
MCHC: 29.9 g/dL — ABNORMAL LOW (ref 30.0–36.0)
MCV: 100 fL (ref 80.0–100.0)
Platelets: 128 10*3/uL — ABNORMAL LOW (ref 150–400)
RBC: 2.11 MIL/uL — ABNORMAL LOW (ref 3.87–5.11)
RDW: 13.2 % (ref 11.5–15.5)
WBC: 4.7 10*3/uL (ref 4.0–10.5)
nRBC: 0 % (ref 0.0–0.2)

## 2019-06-18 LAB — PROTIME-INR
INR: 1.1 (ref 0.8–1.2)
Prothrombin Time: 13.4 s (ref 11.4–15.2)

## 2019-06-18 LAB — PREPARE RBC (CROSSMATCH)

## 2019-06-18 LAB — POTASSIUM
Potassium: 5.2 mmol/L — ABNORMAL HIGH (ref 3.5–5.1)
Potassium: 5.1 mmol/L (ref 3.5–5.1)

## 2019-06-18 LAB — HEMOGLOBIN AND HEMATOCRIT, BLOOD
HCT: 33.5 % — ABNORMAL LOW (ref 36.0–46.0)
Hemoglobin: 10.9 g/dL — ABNORMAL LOW (ref 12.0–15.0)

## 2019-06-18 LAB — MAGNESIUM: Magnesium: 1.8 mg/dL (ref 1.7–2.4)

## 2019-06-18 MED ORDER — SODIUM ZIRCONIUM CYCLOSILICATE 5 G PO PACK: 5.0000 g | PACK | Freq: Once | ORAL | Status: DC

## 2019-06-18 MED ORDER — SODIUM ZIRCONIUM CYCLOSILICATE 5 G PO PACK
5.0000 g | PACK | Freq: Once | ORAL | Status: AC
  Administered 2019-06-18: 5 g via ORAL
  Filled 2019-06-18: qty 1

## 2019-06-18 MED ORDER — ALUM & MAG HYDROXIDE-SIMETH 200-200-20 MG/5ML PO SUSP
30.0000 mL | ORAL | Status: DC | PRN
  Administered 2019-06-18: 30 mL via ORAL
  Filled 2019-06-18: qty 30

## 2019-06-18 MED ORDER — SODIUM CHLORIDE 0.9% IV SOLUTION: Freq: Once | INTRAVENOUS | Status: AC

## 2019-06-18 NOTE — Discharge Summary (Signed)
Physician Discharge Summary  [name] GNF:[id number] DOB: 06-26-1930 DOA: 06/17/2019  PCP: Darreld Mclean, MD  Admit date: 06/17/2019 Discharge date: 06/18/2019  Admitted From: Home Disposition: Home  Recommendations for Outpatient Follow-up:  1. Follow up with PCP in 1 week 2. Follow up with GI in 2 weeks 3. Please obtain BMP/CBC in one week 4. Please follow up on the following pending results: None  Home Health: None Equipment/Devices: None  Discharge Condition: Stable CODE STATUS: DNI Diet recommendation: Soft diet   Brief/Interim Summary:  Admission HPI written by Flora Lipps, MD   Chief Complaint: Rectal bleed  HPI: [name] is a 86 y.o. female with past medical history of advanced rheumatoid arthritis, GERD, Sjogren's syndrome, discoid lupus erythematous, hypothyroidism, non-small cell lung cancer status post resection hospital with complaints of multiple episodes of bright red bleeding per rectum at least 3 times today.  She had significant amount of bright red blood.  Patient also felt dizzy lightheaded and near passing out spell with bleeding.  She did have some lower abdominal cramps associated with bleeding.  Of note patient did have hemorrhoidal banding in the past and the last banding was on 05/31/2019.  Patient denies any chest pain, palpitation, syncope.  Patient denies any nausea, vomiting but has crampy lower abdominal pain during bleeding.  Denies any urinary urgency, frequency or dysuria.  Patient lives by herself and has a walker for ambulation.  She does have aide who helps her out and her daughter lives nearby.  Does complain of pain in the right shoulder from arthritis.    Hospital course:  Rectal bleeding Patient required urgent flexible sigmoidoscopy which was significant for a bleeding rectal ulcer requiring hemostatic clips and epinephrine injection. Patient passed blood clots day of discharge which was expected but no  hematochezia. Patient recommended to follow-up with GI as an outpatient.  Acute blood loss anemia Patient presented with a hemoglobin of 10 with acute drop to as low as 6.3. She received 2 units of PRBC and hemoglobin post-transfusion was 10.9. Will need repeat CBC as an outpatient.  Hyperkalemia Peak potassium of 7.1. Improved with IV fluids and Lokelma. Will need repeat BMP.  Constipation Continue bowel regimen.  Hypothyroidism Cardiomyopathy Hypertension Non-small cell lung cancer Hyponatremia Rheumatoid arthritis Elevated creatinine: Resolved.  Discharge Diagnoses:  Principal Problem:   Rectal bleed Active Problems:   Rheumatoid arthritis involving multiple joints (HCC)   Anemia of chronic disease   HTN (hypertension)   Rectal ulcer   Acute posthemorrhagic anemia    Discharge Instructions   Allergies as of 06/18/2019      Reactions   Amlodipine Rash   Prochlorperazine Edisylate Anaphylaxis   Compazine--- tongue swells and rash   Aspirin Other (See Comments)   nose bleeds. Cannot take NSAIDS    Cymbalta [duloxetine Hcl] Diarrhea, Nausea And Vomiting, Other (See Comments)   Increased blood pressure   Pamelor [nortriptyline Hcl] Diarrhea, Nausea Only   Increased Heart rate and BP      Medication List    STOP taking these medications   meloxicam 7.5 MG tablet Commonly known as: MOBIC     TAKE these medications   BIOFREEZE EX Apply 1 application topically daily as needed (pain).   Biotin 1000 MCG tablet Take 1,000 mcg by mouth daily.   Calcium 500 + D 500-125 MG-UNIT Tabs Generic drug: Calcium Carbonate-Vitamin D Take 1 tablet by mouth daily.   carvedilol 3.125 MG tablet Commonly known as: COREG TAKE  1 TABLET (3.125 MG TOTAL) BY MOUTH 2 (TWO) TIMES DAILY.   denosumab 60 MG/ML Sosy injection Commonly known as: PROLIA Inject 60 mg into the skin every 6 (six) months. Last injection 04-22-2019   Diclofenac Sodium 3 % Gel Apply a pea sized amount to  affected joint twice daily as needed. What changed:   how much to take  how to take this  when to take this  reasons to take this   gabapentin 300 MG capsule Commonly known as: NEURONTIN TAKE 300MG  IN THE MORNING, 300MG  IN THE AFTERNOON, AND 600MG  AT BEDTIME.   GLUCOSAMINE CHONDR 1500 COMPLX PO Take 1 capsule by mouth daily.   leflunomide 10 MG tablet Commonly known as: ARAVA TAKE 1 TABLET BY MOUTH EVERY DAY   levothyroxine 75 MCG tablet Commonly known as: SYNTHROID Take 1 tablet (75 mcg total) by mouth daily before breakfast.   methocarbamol 500 MG tablet Commonly known as: ROBAXIN TAKE 1 TABLET (500 MG TOTAL) BY MOUTH EVERY 6 (SIX) HOURS AS NEEDED FOR MUSCLE SPASMS.   multivitamin tablet Take 1 tablet by mouth daily.   omeprazole 20 MG capsule Commonly known as: PRILOSEC TAKE 1 CAPSULE BY MOUTH 2 TIMES DAILY BEFORE A MEAL. What changed: See the new instructions.   PROBIOTIC PO Take 1 capsule by mouth daily.   Stool Softener 100 MG capsule Generic drug: docusate sodium Take 100 mg by mouth 2 (two) times daily.   TYLENOL EXTRA STRENGTH PO Take 1-2 tablets by mouth every 6 (six) hours as needed (pain).       Allergies  Allergen Reactions  . Amlodipine Rash  . Prochlorperazine Edisylate Anaphylaxis    Compazine--- tongue swells and rash  . Aspirin Other (See Comments)    nose bleeds. Cannot take NSAIDS   . Cymbalta [Duloxetine Hcl] Diarrhea, Nausea And Vomiting and Other (See Comments)    Increased blood pressure  . Pamelor [Nortriptyline Hcl] Diarrhea and Nausea Only    Increased Heart rate and BP    Consultations:  Gastroenterology   Procedures/Studies: Cardiac event monitor  Result Date: 06/13/2019 Sinus bradycardia, normal sinus rhythm, sinus tachycardia, rare PVC and brief episode of SVT. [place] (06/17/2019) Impression:               - A single (solitary) ulcer in the distal rectum                             with active bleeding and a visible vessel. Complete                            cessation of bleeding with clips and Epinephrine                            injection. The tissue surrounding the ulcer was                            hard and fibrotic, consistent with post banding                            scar, adn did not allow for full tissue closure of  the ulcer defect.                           - Blood in the rectum.                           - No specimens collected.  Recommendation:           - Admit the patient to hospital ward for ongoing                            care.                           - Full liquid diet today.                           - Serial Hgb/Hct checks overnight Q6 hours with                            transfusion if Hgb <7.                           - Type and screen.                           - Continue laxatives and stool softeners that are                            prescribed as an outpatient to maintain stools that                            are soft and without straining to have bowel                            movement.                           - Repeat flexible sigmoidoscopy PRN for                            retreatment. If rebleeding, would plan for                            Hemospray deployment over the ulcer bed as the                            tissue is not pliable enough to allow full mucosal                            closure.                           - Appreciate assistance by Hospitalist service in                            the admission of this patient.                           -  I discussed these results with the patient and                            her daighter by phone per patient request.   Subjective: Passed some blood clots. No other issues. Feeling much better today.  Discharge Exam: Vitals:   06/18/19 0658 06/18/19 0920  BP: 139/73 (!) 159/74  Pulse: 72 79  Resp: 17 20  Temp: 98.4 F  (36.9 C) 98.5 F (36.9 C)  SpO2: 96% 99%   Vitals:   06/18/19 0632 06/18/19 0639 06/18/19 0658 06/18/19 0920  BP: (!) 139/59 134/69 139/73 (!) 159/74  Pulse: 67  72 79  Resp: (!) 21  17 20   Temp: 97.9 F (36.6 C) 97.9 F (36.6 C) 98.4 F (36.9 C) 98.5 F (36.9 C)  TempSrc: Oral Oral Oral Oral  SpO2: 96% 95% 96% 99%  Weight:      Height:        General: Pt is alert, awake, not in acute distress Cardiovascular: RRR, S1/S2 +, no rubs, no gallops Respiratory: CTA bilaterally, no wheezing, no rhonchi Abdominal: Soft, NT, ND, bowel sounds + Extremities: no edema, no cyanosis    The results of significant diagnostics from this hospitalization (including imaging, microbiology, ancillary and laboratory) are listed below for reference.     Microbiology: Recent Results (from the past 240 hour(s))  Respiratory Panel by RT PCR (Flu A&B, Covid) - Nasopharyngeal Swab     Status: None   Collection Time: 06/17/19  4:28 PM   Specimen: Nasopharyngeal Swab  Result Value Ref Range Status   SARS Coronavirus 2 by RT PCR NEGATIVE NEGATIVE Final    Comment: (NOTE) SARS-CoV-2 target nucleic acids are NOT DETECTED. The SARS-CoV-2 RNA is generally detectable in upper respiratoy specimens during the acute phase of infection. The lowest concentration of SARS-CoV-2 viral copies this assay can detect is 131 copies/mL. A negative result does not preclude SARS-Cov-2 infection and should not be used as the sole basis for treatment or other patient management decisions. A negative result may occur with  improper specimen collection/handling, submission of specimen other than nasopharyngeal swab, presence of viral mutation(s) within the areas targeted by this assay, and inadequate number of viral copies (<131 copies/mL). A negative result must be combined with clinical observations, patient history, and epidemiological information. The expected result is Negative. Fact Sheet for Patients:    PinkCheek.be Fact Sheet for Healthcare Providers:  GravelBags.it This test is not yet ap proved or cleared by the [place] FDA and  has been authorized for detection and/or diagnosis of SARS-CoV-2 by FDA under an Emergency Use Authorization (EUA). This EUA will remain  in effect (meaning this test can be used) for the duration of the COVID-19 declaration under Section 564(b)(1) of the Act, 21 U.S.C. section 360bbb-3(b)(1), unless the authorization is terminated or revoked sooner.    Influenza A by PCR NEGATIVE NEGATIVE Final   Influenza B by PCR NEGATIVE NEGATIVE Final    Comment: (NOTE) The Xpert Xpress SARS-CoV-2/FLU/RSV assay is intended as an aid in  the diagnosis of influenza from Nasopharyngeal swab specimens and  should not be used as a sole basis for treatment. Nasal washings and  aspirates are unacceptable for Xpert Xpress SARS-CoV-2/FLU/RSV  testing. Fact Sheet for Patients: PinkCheek.be Fact Sheet for Healthcare Providers: GravelBags.it This test is not yet approved or cleared by the [place] FDA and  has been authorized for detection and/or diagnosis  of SARS-CoV-2 by  FDA under an Emergency Use Authorization (EUA). This EUA will remain  in effect (meaning this test can be used) for the duration of the  Covid-19 declaration under Section 564(b)(1) of the Act, 21  U.S.C. section 360bbb-3(b)(1), unless the authorization is  terminated or revoked. Performed at [facility], [place] [address], [place], [place] [address]      Labs: BNP (last 3 results) No results for input(s): BNP in the last 8760 hours. Basic Metabolic Panel: Recent Labs  Lab 06/17/19 1350 06/17/19 1808 06/18/19 0104 06/18/19 1137  NA 133* 133* 133*  --   K 5.3* 7.1* 5.5* 5.2*  CL 102 103 106  --   CO2 24  --  22  --   GLUCOSE 130* 123* 86  --   BUN 35*  48* 32*  --   CREATININE 1.24* 1.20* 0.92  --   CALCIUM 9.1  --  7.5*  --   MG  --   --  1.8  --    Liver Function Tests: Recent Labs  Lab 06/17/19 1350 06/18/19 0104  AST 27 21  ALT 15 12  ALKPHOS 37* 22*  BILITOT 0.6 0.4  PROT 6.9 4.7*  ALBUMIN 3.9 2.7*   No results for input(s): LIPASE, AMYLASE in the last 168 hours. No results for input(s): AMMONIA in the last 168 hours. CBC: Recent Labs  Lab 06/17/19 1350 06/17/19 1808 06/17/19 1950 06/18/19 0104 06/18/19 1137  WBC 4.6  --   --  4.7  --   HGB 10.0* 10.5* 8.0* 6.3* 10.9*  HCT 32.5* 31.0* 25.9* 21.1* 33.5*  MCV 95.6  --   --  100.0  --   PLT 192  --   --  128*  --    Cardiac Enzymes: No results for input(s): CKTOTAL, CKMB, CKMBINDEX, TROPONINI in the last 168 hours. BNP: Invalid input(s): POCBNP CBG: No results for input(s): GLUCAP in the last 168 hours. D-Dimer No results for input(s): DDIMER in the last 72 hours. Hgb A1c No results for input(s): HGBA1C in the last 72 hours. Lipid Profile No results for input(s): CHOL, HDL, LDLCALC, TRIG, CHOLHDL, LDLDIRECT in the last 72 hours. Thyroid function studies Recent Labs    06/16/19 1110  TSH 0.54   Anemia work up Recent Labs    06/16/19 1110  FERRITIN 658.8*   Urinalysis    Component Value Date/Time   COLORURINE YELLOW 07/29/2014 [facility] 07/29/2014 1037   LABSPEC 1.013 07/29/2014 1037   PHURINE 7.5 07/29/2014 1037   GLUCOSEU NEGATIVE 07/29/2014 1037   HGBUR NEGATIVE 07/29/2014 1037   HGBUR negative 10/01/2009 1516   BILIRUBINUR negative 12/13/2018 1130   BILIRUBINUR negative 07/28/2016 1453   KETONESUR trace (5) (A) 12/13/2018 1130   KETONESUR NEGATIVE 07/29/2014 1037   PROTEINUR =100 (A) 12/13/2018 1130   PROTEINUR 0.15 07/28/2016 1453   PROTEINUR NEGATIVE 07/29/2014 1037   UROBILINOGEN 0.2 12/13/2018 1130   UROBILINOGEN 0.2 07/29/2014 1037   NITRITE Negative 12/13/2018 1130   NITRITE negative 07/28/2016 1453   NITRITE  NEGATIVE 07/29/2014 1037   LEUKOCYTESUR Large (3+) (A) 12/13/2018 1130   Sepsis Labs Invalid input(s): PROCALCITONIN,  WBC,  LACTICIDVEN Microbiology Recent Results (from the past 240 hour(s))  Respiratory Panel by RT PCR (Flu A&B, Covid) - Nasopharyngeal Swab     Status: None   Collection Time: 06/17/19  4:28 PM   Specimen: Nasopharyngeal Swab  Result Value Ref Range Status   SARS  Coronavirus 2 by RT PCR NEGATIVE NEGATIVE Final    Comment: (NOTE) SARS-CoV-2 target nucleic acids are NOT DETECTED. The SARS-CoV-2 RNA is generally detectable in upper respiratoy specimens during the acute phase of infection. The lowest concentration of SARS-CoV-2 viral copies this assay can detect is 131 copies/mL. A negative result does not preclude SARS-Cov-2 infection and should not be used as the sole basis for treatment or other patient management decisions. A negative result may occur with  improper specimen collection/handling, submission of specimen other than nasopharyngeal swab, presence of viral mutation(s) within the areas targeted by this assay, and inadequate number of viral copies (<131 copies/mL). A negative result must be combined with clinical observations, patient history, and epidemiological information. The expected result is Negative. Fact Sheet for Patients:  PinkCheek.be Fact Sheet for Healthcare Providers:  GravelBags.it This test is not yet ap proved or cleared by the [place] FDA and  has been authorized for detection and/or diagnosis of SARS-CoV-2 by FDA under an Emergency Use Authorization (EUA). This EUA will remain  in effect (meaning this test can be used) for the duration of the COVID-19 declaration under Section 564(b)(1) of the Act, 21 U.S.C. section 360bbb-3(b)(1), unless the authorization is terminated or revoked sooner.    Influenza A by PCR NEGATIVE NEGATIVE Final   Influenza B by PCR NEGATIVE  NEGATIVE Final    Comment: (NOTE) The Xpert Xpress SARS-CoV-2/FLU/RSV assay is intended as an aid in  the diagnosis of influenza from Nasopharyngeal swab specimens and  should not be used as a sole basis for treatment. Nasal washings and  aspirates are unacceptable for Xpert Xpress SARS-CoV-2/FLU/RSV  testing. Fact Sheet for Patients: PinkCheek.be Fact Sheet for Healthcare Providers: GravelBags.it This test is not yet approved or cleared by the [place] FDA and  has been authorized for detection and/or diagnosis of SARS-CoV-2 by  FDA under an Emergency Use Authorization (EUA). This EUA will remain  in effect (meaning this test can be used) for the duration of the  Covid-19 declaration under Section 564(b)(1) of the Act, 21  U.S.C. section 360bbb-3(b)(1), unless the authorization is  terminated or revoked. Performed at [facility], [place] [address] [place], [place] [address]     SIGNED:   Cordelia Poche, MD [facility] 06/18/2019, 12:42 PM

## 2019-06-18 NOTE — Progress Notes (Signed)
CRITICAL VALUE STICKER  CRITICAL VALUE: Hgb 6.3  RECEIVER (on-site recipient of call): Jerrye Noble RN  DATE & TIME NOTIFIED: 06/18/19 0157  MESSENGER (representative from lab): Atha Starks  MD NOTIFIED: Kennon Holter, NP  TIME OF NOTIFICATION: 0210  RESPONSE: See new orders

## 2019-06-18 NOTE — Progress Notes (Signed)
[facility] ROUNDING NOTE   Subjective: Urgent flexible sigmoidoscopy performed last evening and notable for distal rectal ulcer with visible vessel that was actively bleeding.  Successfully managed with hemostatic clips x2 and epinephrine.  Third clip was placed in an attempt to close ulcer defect, but due to underlying scar, entire ulcer could not be zip closed.  Tolerated liquid diet without issue.  Past clots overnight, but no active bleeding.  Hemoglobin decreased to 6.3, currently receiving second unit PRBCs.  She otherwise feels well, and is asking for a possible discharge later today.  Objective: Vital signs in last 24 hours: Temp:  [97.4 F (36.3 C)-98.6 F (37 C)] 98.4 F (36.9 C) (05/01 0658) Pulse Rate:  [61-97] 72 (05/01 0658) Resp:  [16-23] 17 (05/01 0658) BP: (104-195)/(48-87) 139/73 (05/01 0658) SpO2:  [95 %-100 %] 96 % (05/01 0658) Weight:  [46.7 kg] 46.7 kg (04/30 1339) Last BM Date: 06/18/19 General: NAD Abdomen:  Soft, NT, ND    Intake/Output from previous day: 04/30 0701 - 05/01 0700 In: 1828.3 [I.V.:1428.3; Blood:400] Out: 400 [Urine:400] Intake/Output this shift: No intake/output data recorded.   Lab Results: Recent Labs    06/17/19 1350 06/17/19 1350 06/17/19 1808 06/17/19 1950 06/18/19 0104  WBC 4.6  --   --   --  4.7  HGB 10.0*   < > 10.5* 8.0* 6.3*  PLT 192  --   --   --  128*  MCV 95.6  --   --   --  100.0   < > = values in this interval not displayed.   BMET Recent Labs    06/17/19 1350 06/17/19 1808 06/18/19 0104  NA 133* 133* 133*  K 5.3* 7.1* 5.5*  CL 102 103 106  CO2 24  --  22  GLUCOSE 130* 123* 86  BUN 35* 48* 32*  CREATININE 1.24* 1.20* 0.92  CALCIUM 9.1  --  7.5*   LFT Recent Labs    06/17/19 1350 06/18/19 0104  PROT 6.9 4.7*  ALBUMIN 3.9 2.7*  AST 27 21  ALT 15 12  ALKPHOS 37* 22*  BILITOT 0.6 0.4   PT/INR Recent Labs    06/18/19 0104  INR 1.1      Imaging/Other results: No results  found.    Assessment and Plan:  1) Rectal ulcer with active bleeding 2) Acute posthemorrhagic anemia 3) History of constipation 4) History of internal hemorrhoids s/p obliteration by banding  86 year old female presented on 06/17/2019 with acute onset brisk hematochezia.  Urgent flexible sigmoidoscopy with distal rectal ulcer with visible vessel and active, brisk bleeding.  Bleeding stopped with hemostatic clips x2 and epinephrine injection.  Due to underlying scar tissue (presumably from prior banding), the entire ulcer defect could not be closed, as the.  Nonetheless, the active bleeding was successfully managed.  H/H decline overnight somewhat expected given the amount of bleeding noted on admission and during endoscopy.  Otherwise remained hemodynamically stable.  -Post transfusion H/H check -Patient requesting discharge home later today if possible.  Told her timing of discharge would be depending on response to transfusion, hemodynamics, and no evidence of ongoing bleeding (expect her to continue to clear clots given degree of bleeding on admission). -Okay to advance diet as tolerated -Continue home laxative/stool softener regimen for chronic constipation -Will set her up for repeat CBC 7-10 days after hospital discharge along with follow-up appointment with me in the clinic in 2-3 weeks    [place], DO  06/18/2019, 8:07 AM  [facility] Pager [phone]

## 2019-06-18 NOTE — Care Management Obs Status (Signed)
[facility] NOTIFICATION   Patient Details  Name: [name] MRN: [id number] Date of Birth: 1931/01/13   Medicare Observation Status Notification Given:  Yes    Joaquin Courts, RN 06/18/2019, 5:04 PM

## 2019-06-18 NOTE — Care Management CC44 (Signed)
Condition Code 44 Documentation Completed  Patient Details  Name: [name] MRN: [id number] Date of Birth: April 07, 1930   Condition Code 44 given:  Yes Patient signature on Condition Code 44 notice:  Yes Documentation of 2 MD's agreement:  Yes Code 44 added to claim:  Yes    Joaquin Courts, RN 06/18/2019, 5:04 PM

## 2019-06-18 NOTE — Discharge Instructions (Addendum)
[name],  You were in the hospital because of rectal bleeding. This required urgent endoscopy. You required blood transfusion and have improved. Please follow-up with your primary care physician and the gastroenterologist.

## 2019-06-20 ENCOUNTER — Ambulatory Visit (INDEPENDENT_AMBULATORY_CARE_PROVIDER_SITE_OTHER): Payer: Medicare PPO | Admitting: Gastroenterology

## 2019-06-20 ENCOUNTER — Encounter: Payer: Self-pay | Admitting: Gastroenterology

## 2019-06-20 ENCOUNTER — Other Ambulatory Visit: Payer: Self-pay | Admitting: Gastroenterology

## 2019-06-20 VITALS — Ht 60.0 in | Wt 102.0 lb

## 2019-06-20 DIAGNOSIS — K921 Melena: Secondary | ICD-10-CM

## 2019-06-20 DIAGNOSIS — K219 Gastro-esophageal reflux disease without esophagitis: Secondary | ICD-10-CM | POA: Diagnosis not present

## 2019-06-20 DIAGNOSIS — K5900 Constipation, unspecified: Secondary | ICD-10-CM | POA: Diagnosis not present

## 2019-06-20 DIAGNOSIS — K449 Diaphragmatic hernia without obstruction or gangrene: Secondary | ICD-10-CM | POA: Diagnosis not present

## 2019-06-20 DIAGNOSIS — K648 Other hemorrhoids: Secondary | ICD-10-CM | POA: Diagnosis not present

## 2019-06-20 DIAGNOSIS — D638 Anemia in other chronic diseases classified elsewhere: Secondary | ICD-10-CM | POA: Diagnosis not present

## 2019-06-20 LAB — BPAM RBC
Blood Product Expiration Date: 202105182359
Blood Product Expiration Date: 202105192359
ISSUE DATE / TIME: 202105010304
ISSUE DATE / TIME: 202105010634
Unit Type and Rh: 6200
Unit Type and Rh: 6200

## 2019-06-20 LAB — TYPE AND SCREEN
ABO/RH(D): A POS
Antibody Screen: NEGATIVE
Unit division: 0
Unit division: 0

## 2019-06-20 NOTE — Patient Instructions (Signed)
Return as needed.   It was a pleasure to see you today!  Vito Cirigliano, D.O.

## 2019-06-20 NOTE — Progress Notes (Signed)
Chief Complaint: Hospital follow-up   GI History:    [name] is a 86 y.o. female with a history of GERD, Sjogren's syndrome, RA, Raynaud's, chronic constipation, discoid lupus, LBBB, [name], non-small cell cancer of right lung in 2015, OA, follows in the GI clinic for symptomatic hemorrhoids (hematochezia, perianal irritation), along with GERD/hiatal hernia and constipation.   Has had hemorrhoid banding as follows:  -03/21/2019: Successful banding of the RAhemorrhoid -04/26/2019:Successful banding of the LLhemorrhoid -05/31/2019:Successful banding of the RP hemorrhoid  2) GERD: Reflux symptoms well controlled Prilosec 20 mg/day.Will occasionally take a second dose prior to known exacerbating foods (i.e. tomato based sauces). No dysphagia.  3) Constipation: Longstanding history of constipation/straining essentially for as long as she can remember. Take stool softener, probiotics, high-fiber diet along with MiraLAX as needed to keep stools soft.  Endoscopic History: -Colonoscopy (04/2018, Dr.Mansouraty): Grade 2 internal hemorrhoids, 3 small tubular adenomas -EGD (03/2018, Dr. Havery Moros): 7 cm HH, tortuous esophagus, localized nodularity at [name] (biopsy: H. pylori gastritis-treated as inpatient), Normal stomach/duodenum  Hospitalized 4/30-5/1 with abrupt onset painless hematochezia.  Urgency flexible sigmoidoscopy on the evening of admission notable for actively bleeding visible vessel in the distal rectum with surrounding ulcer.  Successfully treated with hemostatic clips and epinephrine injection.  Hemoglobin nadir 6.3, transfused with 2 units PRBCs, with discharge hemoglobin 10.9.   HPI:    Due to current restrictions/limitations of in-office visits due to the COVID-19 pandemic, this scheduled clinical appointment was converted to a telehealth consultation via telephone.  -Time of medical discussion: 15 minutes -The patient did consent to this  telephone visit and is aware of possible charges through their insurance for this visit.  -Names of all parties present: [name] (patient), Raven Heck, DO, [facility] (physician)  Patient is a 86 y.o. female presenting to the Gastroenterology Clinic for hospital follow-up.   Was admitted last week with acute onset hematochezia, treated with urgent flexible sigmoidoscopy with hemostatic clip placement of a actively bleeding visible vessel of distal rectum, as outlined above.  Hgb nadir 6.3, transfused 2U pRBCs. Otherwise remained HD stable the entire time.   Feeling well today. Tolerating PO without issue. No further bleeding. Normal BM this AM.  Discharged with H/H 10.9/33.5.  She feels back to her usual state of health, and very appreciative of the swift care over the weekend.  Past medical history, past surgical history, social history, family history, medications, and allergies reviewed in the chart and with patient over the phone.  Past Medical History:  Diagnosis Date  . Arthralgia of multiple joints    followed by dr Gerilyn Nestle  . Arthritis   . Cardiomyopathy ([place])   . Chronic constipation   . Chronic inflammatory arthritis    rhemotolgist-  dr a. Gerilyn Nestle [facility] [place])  . Dry eyes    eye drops used   . GERD (gastroesophageal reflux Ellis)   . H/O discoid lupus erythematosus   . Hiatal hernia   . History of colon polyps   . Hypothyroidism   . Iron deficiency anemia   . LBBB (left bundle branch block) 2010  . [name] (erythromelalgia) [facility])    neurologist-  dr patel  . Nocturia   . Non-small cell cancer of right lung [facility]) surgeon-- dr gerhardt/  oncologist-  dr Julien Nordmann--- per lov notes no recurrence/   11-18-2017 per pt denies any symptoms   dx 2015--  Stage IIA (T2b,N0,M0) , +  EGFR  mutation in exon 21, non-small cell adenocarcinoma right upper lobe---  s/p  Right upper lobectomy , right middley wedge resection and node dissection---  no chemo or  radiation therapy  . OA (osteoarthritis)    hands  . Osteoporosis   . PONV (postoperative nausea and vomiting)    likes phenergan  . Raynaud's phenomenon 1965  . Renal insufficiency   . Rheumatoid arthritis ([place])   . Sciatica   . Scoliosis   . Sjogren's syndrome [facility])      Past Surgical History:  Procedure Laterality Date  . ANTERIOR HIP REVISION Right 11/27/2017   Procedure: RIGHT HIP ACETABULAR REVISION;  Surgeon: [name], MD;  Location: [facility];  Service: Orthopedics;  Laterality: Right;  . ANTERIOR HIP REVISION Right 01/24/2018   Procedure: OPEN REDUCTION OF DISLOCATED ANTERIOR HIP WITH REVISION OF LINER AND HIP BALL;  Surgeon: [name], MD;  Location: [facility];  Service: Orthopedics;  Laterality: Right;  . APPENDECTOMY  1950s  . BIOPSY  04/14/2018   Procedure: BIOPSY;  Surgeon: Yetta Flock, MD;  Location: [place];  Service: Gastroenterology;;  . BIOPSY  04/16/2018   Procedure: BIOPSY;  Surgeon: Irving Copas., MD;  Location: [facility];  Service: Gastroenterology;;  . CARDIOVASCULAR STRESS TEST  12/2008    mild fixed basal to mid septal perfusion defect felt likely due to artifact from LBBB, no ischemia, EF 58%  . COLONOSCOPY    . COLONOSCOPY WITH PROPOFOL N/A 04/16/2018   Procedure: COLONOSCOPY WITH PROPOFOL;  Surgeon: Rush Landmark Telford Nab., MD;  Location: [place];  Service: Gastroenterology;  Laterality: N/A;  . ESOPHAGOGASTRODUODENOSCOPY (EGD) WITH PROPOFOL N/A 04/14/2018   Procedure: ESOPHAGOGASTRODUODENOSCOPY (EGD) WITH PROPOFOL;  Surgeon: Yetta Flock, MD;  Location: [place];  Service: Gastroenterology;  Laterality: N/A;  . FEMORAL-POPLITEAL BYPASS GRAFT Right 04/10/2018   Procedure: REPAIR RIGHT FEMORAL ARTERY PSEUDOANEURYSM, RETROPERITONEAL EXPOSURE OF ILIAC ARTERY, RIGHT POPLITEAL EMBOLECTOMY;  Surgeon: Angelia Mould, MD;  Location: [place];  Service: Vascular;  Laterality: Right;  . LYMPH NODE  DISSECTION Right 06/07/2013   Procedure: LYMPH NODE DISSECTION;  Surgeon: [name], MD;  Location: [place];  Service: Thoracic;  Laterality: Right;  . PATCH ANGIOPLASTY Right 04/10/2018   Procedure: PATCH  ANGIOPLASTY OF RIGHT FEMORAL ARTERY USING BOVINE PATCH, PATCH ANGIOPLASTY OF RIGHT POPLITEAL ARTERY USING BOVINE PATCH;  Surgeon: Angelia Mould, MD;  Location: [place];  Service: Vascular;  Laterality: Right;  . [place]   "large incision from chest to up to shoulder, the nerves were tied together, for raynaud's  . THORACOTOMY  06/07/2013   Procedure: MINI/LIMITED THORACOTOMY; right middle lobe wedge resection;  Surgeon: [name], MD;  Location: [place];  Service: Thoracic;;  . TONSILLECTOMY  child  . TOTAL ABDOMINAL HYSTERECTOMY  1980's    W/ BSO  . TOTAL HIP ARTHROPLASTY Right 04/28/2014   Procedure: RIGHT TOTAL HIP ARTHROPLASTY ANTERIOR APPROACH;  Surgeon: [name], MD;  Location: [facility];  Service: Orthopedics;  Laterality: Right;  . TRANSTHORACIC ECHOCARDIOGRAM  12/11/2008   ef 56-38%, grade 1 diastolic dysfunction/  mild LAE/  mild AR and MR/  trivial TR  . VIDEO ASSISTED THORACOSCOPY (VATS)/WEDGE RESECTION Right 06/07/2013   Procedure: VIDEO ASSISTED THORACOSCOPY (VATS)/right upper lobectomy, On Q;  Surgeon: [name], MD;  Location: [place];  Service: Thoracic;  Laterality: Right;  Marland Kitchen VIDEO BRONCHOSCOPY N/A 06/07/2013   Procedure: VIDEO BRONCHOSCOPY;  Surgeon: [name],  MD;  Location: [facility];  Service: Thoracic;  Laterality: N/A;  . VIDEO BRONCHOSCOPY WITH ENDOBRONCHIAL NAVIGATION N/A 05/04/2013   Procedure: VIDEO BRONCHOSCOPY WITH ENDOBRONCHIAL NAVIGATION;  Surgeon: [name], MD;  Location: [facility];  Service: Thoracic;  Laterality: N/A;   Family History  Problem Relation Age of Onset  . Coronary artery Ellis Father   . Colon cancer Father   . Diabetes Father   . Cancer Father        colon  . Other Mother 52        MVA  . Healthy Sister   . Healthy Brother   . Healthy Daughter   . Hypothyroidism Daughter   . Other Brother        killed in war  . Pneumonia Sister   . Healthy Daughter   . Esophageal cancer Neg Hx   . Kidney Ellis Neg Hx   . Liver Ellis Neg Hx    Social History   Tobacco Use  . Smoking status: Never Smoker  . Smokeless tobacco: Never Used  Substance Use Topics  . Alcohol use: Not Currently  . Drug use: Never   Current Outpatient Medications  Medication Sig Dispense Refill  . Acetaminophen (TYLENOL EXTRA STRENGTH PO) Take 1-2 tablets by mouth every 6 (six) hours as needed (pain).     . Biotin 1000 MCG tablet Take 1,000 mcg by mouth daily.     . Calcium Carbonate-Vitamin D (CALCIUM 500 + D) 500-125 MG-UNIT TABS Take 1 tablet by mouth daily.     . carvedilol (COREG) 3.125 MG tablet TAKE 1 TABLET (3.125 MG TOTAL) BY MOUTH 2 (TWO) TIMES DAILY. 180 tablet 3  . denosumab (PROLIA) 60 MG/ML SOSY injection Inject 60 mg into the skin every 6 (six) months. Last injection 04-22-2019    . Diclofenac Sodium 3 % GEL Apply a pea sized amount to affected joint twice daily as needed. (Patient taking differently: Apply 1 application topically 2 (two) times daily as needed (pain). Apply a pea sized amount to affected joint twice daily as needed.) 100 g 2  . docusate sodium (STOOL SOFTENER) 100 MG capsule Take 100 mg by mouth 2 (two) times daily.      Marland Kitchen gabapentin (NEURONTIN) 300 MG capsule TAKE '300MG'$  IN THE MORNING, '300MG'$  IN THE AFTERNOON, AND '600MG'$  AT BEDTIME. 360 capsule 1  . Glucosamine-Chondroit-Vit C-Mn (GLUCOSAMINE CHONDR 1500 COMPLX PO) Take 1 capsule by mouth daily.     Marland Kitchen leflunomide (ARAVA) 10 MG tablet TAKE 1 TABLET BY MOUTH EVERY DAY 90 tablet 0  . levothyroxine (SYNTHROID) 75 MCG tablet Take 1 tablet (75 mcg total) by mouth daily before breakfast. 30 tablet 5  . Menthol, Topical Analgesic, (BIOFREEZE EX) Apply 1 application topically daily as needed (pain).    . methocarbamol  (ROBAXIN) 500 MG tablet TAKE 1 TABLET (500 MG TOTAL) BY MOUTH EVERY 6 (SIX) HOURS AS NEEDED FOR MUSCLE SPASMS. 40 tablet 0  . Multiple Vitamin (MULTIVITAMIN) tablet Take 1 tablet by mouth daily.      Marland Kitchen omeprazole (PRILOSEC) 20 MG capsule TAKE 1 CAPSULE BY MOUTH 2 TIMES DAILY BEFORE A MEAL. (Patient taking differently: Take 20 mg by mouth daily. ) 180 capsule 1  . Probiotic Product (PROBIOTIC PO) Take 1 capsule by mouth daily.      No current facility-administered medications for this visit.   Allergies  Allergen Reactions  . Amlodipine Rash  . Prochlorperazine Edisylate Anaphylaxis    Compazine--- tongue swells and rash  .  Aspirin Other (See Comments)    nose bleeds. Cannot take NSAIDS   . Cymbalta [Duloxetine Hcl] Diarrhea, Nausea And Vomiting and Other (See Comments)    Increased blood pressure  . Pamelor [Nortriptyline Hcl] Diarrhea and Nausea Only    Increased Heart rate and BP     Review of Systems: All systems reviewed and negative except where noted in HPI.     Physical Exam:    Physical exam not completed due to the nature of this telehealth communication.  Patient was otherwise alert and oriented and well communicative.   ASSESSMENT AND PLAN;   1) Hematochezia 2) Acute on chronic anemia-corrected 3) History of internal hemorrhoids s/p banding   A 86 year old female with recent admission for acute onset hematochezia.  Flexible sigmoidoscopy with actively bleeding visible vessel in the distal rectum with surrounding ulcer.  Presumed previous hemorrhoid banding site.  This was successfully managed with hemostatic clips and epinephrine.  Could not close the entire ulcer defect due to underlying scar tissue, consistent with previous band obliteration.  Nonetheless, complete cessation of bleeding with no recurrence.  At her usual state of health today.  -Can follow-up in the Hematology clinic for chronic anemia -Taking oral iron for chronic anemia -Internal hemorrhoids  were otherwise not seen at the time of flexible sigmoidoscopy, consistent with previous band eradication  4) Constipation: -Well-controlled with current regimen  5) GERD 6) Hiatal hernia -Well-controlled with current regimen-Prilosec 20 mg/day with second dose taken as needed prior to known exacerbating foods (i.e. tomato based sauces)   RTC as needed  Lavena Bullion, DO, FACG  06/20/2019, 10:58 AM   [place], Gay Filler, MD

## 2019-06-21 ENCOUNTER — Other Ambulatory Visit: Payer: Self-pay | Admitting: Rheumatology

## 2019-06-21 NOTE — Progress Notes (Signed)
Office Visit Note  Patient: [name]             Date of Birth: 1930-05-27           MRN: [id number]             PCP: Darreld Mclean, MD Referring: Darreld Mclean, MD Visit Date: 06/28/2019 Occupation: '@GUAROCC'$ @  Subjective:  Hair loss   History of Present Illness: [name] is a 86 y.o. female with seronegative rheumatoid arthritis.  She states that she continues to have some discomfort in her lower back and her joints.  She has not had much joint swelling.  She states she has noticed increased hair loss in the last few months.  She believes that is related to leflunomide.  She had macular changes with Plaquenil and she could not use methotrexate due to elevated creatinine.  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  She had taken Plaquenil several years ago for the skin lupus as well.  Activities of Daily Living:  Patient reports morning stiffness for 24 hours.   Patient Denies nocturnal pain.  Difficulty dressing/grooming: Reports Difficulty climbing stairs: Reports Difficulty getting out of chair: Reports Difficulty using hands for taps, buttons, cutlery, and/or writing: Reports  Review of Systems  Constitutional: Negative for fatigue, night sweats, weight gain and weight loss.  HENT: Positive for mouth dryness. Negative for mouth sores, trouble swallowing, trouble swallowing and nose dryness.   Eyes: Positive for dryness. Negative for pain, redness and visual disturbance.  Respiratory: Negative for cough, shortness of breath and difficulty breathing.   Cardiovascular: Negative for chest pain, palpitations, hypertension, irregular heartbeat and swelling in legs/feet.  Gastrointestinal: Negative for blood in stool, constipation and diarrhea.  Endocrine: Positive for cold intolerance. Negative for increased urination.  Genitourinary: Negative for difficulty urinating and vaginal dryness.  Musculoskeletal: Positive for arthralgias, joint  pain, joint swelling, morning stiffness and muscle tenderness. Negative for gait problem, myalgias, muscle weakness and myalgias.  Skin: Positive for color change. Negative for rash, hair loss, skin tightness, ulcers and sensitivity to sunlight.  Allergic/Immunologic: Negative for susceptible to infections.  Neurological: Negative for dizziness, memory loss, night sweats and weakness.  Hematological: Negative for bruising/bleeding tendency and swollen glands.  Psychiatric/Behavioral: Negative for depressed mood and sleep disturbance. The patient is not nervous/anxious.     PMFS History:  Patient Active Problem List   Diagnosis Date Noted  . Acute posthemorrhagic anemia   . Rectal bleed 06/17/2019  . Grade II internal hemorrhoids   . Rectal ulcer   . Popliteal artery occlusion, right ([place]) 04/10/2018  . HTN (hypertension) 04/10/2018  . Femoral artery pseudo-aneurysm, right ([place]) 04/09/2018  . Anemia of chronic disease 02/24/2018  . Unstable right hip arthroplasty 01/24/2018  . History of revision of total replacement of right hip joint 01/24/2018  . Hypovolemic shock ([place])   . Hyperkalemia   . Hyponatremia   . Failed total hip arthroplasty ([place]) 11/27/2017  . Status post revision of total hip 11/27/2017  . Elevated cholesterol 10/11/2015  . 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[place]) 10/04/2014  . Bilateral leg edema 08/01/2014  . Elevated BP 08/01/2014  . Rheumatoid arthritis involving multiple joints ([place]) 05/30/2014  . Status post total replacement of right hip 04/28/2014  . Long-term use of high-risk medication 11/11/2013  . Symptomatic anemia 11/11/2013  . Neuropathic pain 07/07/2013  . Constipation due to pain medication 07/07/2013  . Protein-calorie malnutrition, severe ([place]) 06/08/2013  .  Lung cancer, Right upper lobe 05/08/2013  . Sciatica of right side 08/13/2011  . Osteoporosis 03/07/2010  . PARESTHESIA 03/07/2010  . CT, CHEST, ABNORMAL 12/18/2008  . ABNORMAL ECHOCARDIOGRAM  12/14/2008  . [place] SYNDROME 11/29/2008  . HYPOGLYCEMIA 06/29/2006  . RAYNAUD'S DISEASE 06/29/2006    Past Medical History:  Diagnosis Date  . Arthralgia of multiple joints    followed by dr Gerilyn Nestle  . Arthritis   . Cardiomyopathy ([place])   . Chronic constipation   . Chronic inflammatory arthritis    rhemotolgist-  dr a. Gerilyn Nestle [facility])  . Dry eyes    eye drops used   . GERD (gastroesophageal reflux disease)   . H/O discoid lupus erythematosus   . Hiatal hernia   . History of colon polyps   . Hypothyroidism   . Iron deficiency anemia   . LBBB (left bundle branch block) 2010  . Mitchell's disease (erythromelalgia) [facility])    neurologist-  dr patel  . Nocturia   . Non-small cell cancer of right lung [facility]) surgeon-- dr gerhardt/  oncologist-  dr Julien Nordmann--- per lov notes no recurrence/   11-18-2017 per pt denies any symptoms   dx 2015--  Stage IIA (T2b,N0,M0) , +EGFR  mutation in exon 21, non-small cell adenocarcinoma right upper lobe---  s/p  Right upper lobectomy , right middley wedge resection and node dissection---  no chemo or radiation therapy  . OA (osteoarthritis)    hands  . Osteoporosis   . PONV (postoperative nausea and vomiting)    likes phenergan  . Raynaud's phenomenon 1965  . Renal insufficiency   . Rheumatoid arthritis ([place])   . Sciatica   . Scoliosis   . Sjogren's syndrome ([place])     Family History  Problem Relation Age of Onset  . Coronary artery disease Father   . Colon cancer Father   . Diabetes Father   . Cancer Father        colon  . Other Mother 21       MVA  . Healthy Sister   . Healthy Brother   . Healthy Daughter   . Hypothyroidism Daughter   . Other Brother        killed in war  . Pneumonia Sister   . Healthy Daughter   . Esophageal cancer Neg Hx   . Kidney disease Neg Hx   . Liver disease Neg Hx    Past Surgical History:  Procedure Laterality Date  . ANTERIOR HIP REVISION Right 11/27/2017   Procedure: RIGHT HIP  ACETABULAR REVISION;  Surgeon: [name], MD;  [facility];  Service: Orthopedics;  Laterality: Right;  . ANTERIOR HIP REVISION Right 01/24/2018   Procedure: OPEN REDUCTION OF DISLOCATED ANTERIOR HIP WITH REVISION OF LINER AND HIP BALL;  Surgeon: [name], MD;  [facility];  Service: Orthopedics;  Laterality: Right;  . APPENDECTOMY  1950s  . BIOPSY  04/14/2018   Procedure: BIOPSY;  Surgeon: Yetta Flock, MD;  Location: [place];  Service: Gastroenterology;;  . BIOPSY  04/16/2018   Procedure: BIOPSY;  Surgeon: Irving Copas., MD;  Location[facility];  Service: Gastroenterology;;  . CARDIOVASCULAR STRESS TEST  12/2008    mild fixed basal to mid septal perfusion defect felt likely due to artifact from LBBB, no ischemia, EF 58%  . COLONOSCOPY    . COLONOSCOPY WITH PROPOFOL N/A 04/16/2018   Procedure: COLONOSCOPY WITH PROPOFOL;  Surgeon: Rush Landmark Telford Nab., MD;  Location: [place];  Service: Gastroenterology;  Laterality: N/A;  . ESOPHAGOGASTRODUODENOSCOPY (EGD) WITH PROPOFOL N/A 04/14/2018   Procedure: ESOPHAGOGASTRODUODENOSCOPY (EGD) WITH PROPOFOL;  Surgeon: Yetta Flock, MD;  Location: [place];  Service: Gastroenterology;  Laterality: N/A;  . FEMORAL-POPLITEAL BYPASS GRAFT Right 04/10/2018   Procedure: REPAIR RIGHT FEMORAL ARTERY PSEUDOANEURYSM, RETROPERITONEAL EXPOSURE OF ILIAC ARTERY, RIGHT POPLITEAL EMBOLECTOMY;  Surgeon: Angelia Mould, MD;  Location: [place];  Service: Vascular;  Laterality: Right;  . FLEXIBLE SIGMOIDOSCOPY N/A 06/17/2019   Procedure: FLEXIBLE SIGMOIDOSCOPY;  Surgeon: Lavena Bullion, DO;  [facility];  Service: Gastroenterology;  Laterality: N/A;  . HEMOSTASIS CLIP PLACEMENT  06/17/2019   Procedure: HEMOSTASIS CLIP PLACEMENT;  Surgeon: Lavena Bullion, DO;  [facility];  Service: Gastroenterology;;  . LYMPH NODE DISSECTION Right 06/07/2013   Procedure: LYMPH NODE  DISSECTION;  Surgeon: [name], MD;  Location: [place];  Service: Thoracic;  Laterality: Right;  . PATCH ANGIOPLASTY Right 04/10/2018   Procedure: PATCH  ANGIOPLASTY OF RIGHT FEMORAL ARTERY USING BOVINE PATCH, PATCH ANGIOPLASTY OF RIGHT POPLITEAL ARTERY USING BOVINE PATCH;  Surgeon: Angelia Mould, MD;  Location: [place];  Service: Vascular;  Laterality: Right;  . SCHLEROTHERAPY  06/17/2019   Procedure: SCHLEROTHERAPY OF VARICES;  Surgeon: Lavena Bullion, DO;  [facility];  Service: Gastroenterology;;  . [place]   "large incision from chest to up to shoulder, the nerves were tied together, for raynaud's  . THORACOTOMY  06/07/2013   Procedure: MINI/LIMITED THORACOTOMY; right middle lobe wedge resection;  Surgeon: [name], MD;  Location: [place];  Service: Thoracic;;  . TONSILLECTOMY  child  . TOTAL ABDOMINAL HYSTERECTOMY  1980's    W/ BSO  . TOTAL HIP ARTHROPLASTY Right 04/28/2014   Procedure: RIGHT TOTAL HIP ARTHROPLASTY ANTERIOR APPROACH;  Surgeon: [name], MD;  [facility];  Service: Orthopedics;  Laterality: Right;  . TRANSTHORACIC ECHOCARDIOGRAM  12/11/2008   ef 15-05%, grade 1 diastolic dysfunction/  mild LAE/  mild AR and MR/  trivial TR  . VIDEO ASSISTED THORACOSCOPY (VATS)/WEDGE RESECTION Right 06/07/2013   Procedure: VIDEO ASSISTED THORACOSCOPY (VATS)/right upper lobectomy, On Q;  Surgeon: [name], MD;  Location: [place];  Service: Thoracic;  Laterality: Right;  [place] VIDEO BRONCHOSCOPY N/A 06/07/2013   Procedure: VIDEO BRONCHOSCOPY;  Surgeon: [name], MD;  Location: [facility] OR;  Service: Thoracic;  Laterality: N/A;  . VIDEO BRONCHOSCOPY WITH ENDOBRONCHIAL NAVIGATION N/A 05/04/2013   Procedure: VIDEO BRONCHOSCOPY WITH ENDOBRONCHIAL NAVIGATION;  Surgeon: [name], MD;  Location: [place];  Service: Thoracic;  Laterality: N/A;   Social History   Social History Narrative   Lives with husband,  daughter and grandchild local.   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 Fluad Quad(high Dose 65+) 11/03/2018  . Influenza Split 11/21/2011  . Influenza Whole 11/29/2007, 11/29/2008, 11/29/2009  . Influenza, High Dose Seasonal PF 10/30/2012, 01/02/2015  . Influenza,inj,Quad PF,6+ Mos 11/22/2013, 11/14/2015  . Influenza-Unspecified 11/13/2016, 11/12/2017  . Pneumococcal Conjugate-13 05/22/2015  . Pneumococcal Polysaccharide-23 06/13/2013  . Tdap 08/17/2017  . Zoster 01/27/2014     Objective: Vital Signs: BP (!) 145/80 (BP Location: Right Arm, Patient Position: Sitting, Cuff Size: Normal)   Pulse 84   Resp 18   Ht 5' (1.524 m)   Wt 104 lb (47.2 kg)   BMI 20.31 kg/m    Physical Exam Vitals and nursing note  reviewed.  Constitutional:      Appearance: She is well-developed.  HENT:     Head: Normocephalic and atraumatic.  Eyes:     Conjunctiva/sclera: Conjunctivae normal.  Cardiovascular:     Rate and Rhythm: Normal rate and regular rhythm.     Heart sounds: Normal heart sounds.  Pulmonary:     Effort: Pulmonary effort is normal.     Breath sounds: Normal breath sounds.  Abdominal:     General: Bowel sounds are normal.     Palpations: Abdomen is soft.  Musculoskeletal:     Cervical back: Normal range of motion.  Lymphadenopathy:     Cervical: No cervical adenopathy.  Skin:    General: Skin is warm and dry.     Capillary Refill: Capillary refill takes less than 2 seconds.  Neurological:     Mental Status: She is alert and oriented to person, place, and time.  Psychiatric:        Behavior: Behavior normal.      Musculoskeletal Exam: She has limited range of motion of cervical spine.  Bilateral shoulder joint abduction was limited to 70 degrees.  Elbow joints with good range of motion.  She has synovial thickening over bilateral wrist joints.  She has synovitis over MCPs as  described below.  Knee joints and ankle joints in good range of motion with no synovitis.  Given no tenderness over MTPs.  CDAI Exam: CDAI Score: 11  Patient Global: 5 mm; Provider Global: 5 mm Swollen: 3 ; Tender: 7  Joint Exam 06/28/2019      Right  Left  Glenohumeral   Tender   Tender  Wrist   Tender   Tender  MCP 2     Swollen Tender  MCP 3  Swollen Tender  Swollen Tender     Investigation: No additional findings.  Imaging: Cardiac event monitor  Result Date: 06/13/2019 Sinus bradycardia, normal sinus rhythm, sinus tachycardia, rare PVC and brief episode of SVT. Kirk Ruths    Recent Labs: Lab Results  Component Value Date   WBC 4.7 06/18/2019   HGB 10.9 (L) 06/18/2019   PLT 128 (L) 06/18/2019   NA 133 (L) 06/18/2019   K 5.1 06/18/2019   CL 106 06/18/2019   CO2 22 06/18/2019   GLUCOSE 86 06/18/2019   BUN 32 (H) 06/18/2019   CREATININE 0.92 06/18/2019   BILITOT 0.4 06/18/2019   ALKPHOS 22 (L) 06/18/2019   AST 21 06/18/2019   ALT 12 06/18/2019   PROT 4.7 (L) 06/18/2019   ALBUMIN 2.7 (L) 06/18/2019   CALCIUM 7.5 (L) 06/18/2019   GFRAA >60 06/18/2019    Speciality Comments: No specialty comments available.  Procedures:  No procedures performed Allergies: Amlodipine, Prochlorperazine edisylate, Aspirin, Cymbalta [duloxetine hcl], and Pamelor [nortriptyline hcl]   Assessment / Plan:     Visit Diagnoses: Rheumatoid arthritis of multiple sites with negative rheumatoid factor (HCC) -  RF negative, anti-CCP negative: Patient continues to have some synovitis and synovial thickening.  She states she has discomfort in multiple joints.  She had macular changes with Plaquenil.  Methotrexate caused elevation in creatinine.  She has been tolerating [place] but had inadequate response.  She is also experiencing increased hair loss most likely related to [place].  We will discontinue Arava.  Different treatment options and their side effects were discussed.  We discussed the  option of Imuran or Orencia.  I would avoid anti-TNF's as she has history of skin lupus per patient.  She is  leaning more towards IV Orencia.  We will apply for IV Orencia.  Medication counseling:   Does patient have a diagnosis of COPD? No  Counseled patient that Maureen Chatters is a selective T-cell costimulation blocker indicated for rheumatoid arthritis.  Counseled patient on purpose, proper use, and adverse effects of Orencia. The most common adverse effects are increased risk of infections, headache, and infusion reactions.  There is the possibility of an increased risk of malignancy but it is not well understood if this increased risk is due to the medication or the disease state.  Reviewed the importance of regular labs while on Orencia therapy.  Counseled patient that Maureen Chatters should be held prior to scheduled surgery.  Counseled patient to avoid live vaccines while on Orencia.  Advised patient to get annual influenza vaccine and the pneumococcal vaccine as indicated.  Provided patient with medication education material and answered all questions.  Patient consented to [facility].  Will upload consent into patient's chart.  Will submit benefit's investigation for Orencia.   High risk medication use -  Arava-started on 10/30/2018. Arava to 20 mg 1 tablet alternating with 1/2 tablet by mouth every other day.  Patient had recent GI bleed and had drop in her hemoglobin.  Hemoglobin is improving.  Hair loss-most likely related to her liver.  Raynaud's disease without gangrene-patient gives longstanding history of Raynaud's.  She had good capillary refill in the office.  Sicca syndrome (HCC)-she has history of sicca symptoms.  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  She has no rash on the examination.  Per her request I will obtain autoimmune labs today.  Age-related osteoporosis without current pathological fracture - DEXA 01/28/2019: The BMD measured at Femur  Neck is 0.636 g/cm2 with a T-score of -2.9. Prolia. not good candidate for fosamax or reclast (GERD, CKD)  Height loss - significant height loss likely due to thoracolumbar scoliosis.  She has no known vertebral fractures  Failure of right total hip arthroplasty, subsequent encounter  Status post total replacement of right hip  Other medical problems are listed as follows:  Anemia of chronic disease  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HCC)  Other idiopathic scoliosis, thoracolumbar region  Malignant neoplasm of upper lobe of right lung ([place])  Essential hypertension  Elevated cholesterol  Orders: Orders Placed This Encounter  Procedures  . ANA  . Sjogrens syndrome-A extractable nuclear antibody  . Sjogrens syndrome-B extractable nuclear antibody  . Anti-DNA antibody, double-stranded  . Anti-Smith antibody  . RNP Antibody  . Anti-scleroderma antibody  . C3 and C4  . QuantiFERON-TB Gold Plus   No orders of the defined types were placed in this encounter.   Face-to-face time spent with patient was 30 minutes. Greater than 50% of time was spent in counseling and coordination of care.  Follow-Up Instructions: Return in about 6 weeks (around 08/09/2019) for Rheumatoid arthritis.   Bo Merino, MD  Note - This record has been created using Editor, commissioning.  Chart creation errors have been sought, but may not always  have been located. Such creation errors do not reflect on  the standard of medical care.

## 2019-06-27 ENCOUNTER — Other Ambulatory Visit: Payer: Self-pay | Admitting: Family Medicine

## 2019-06-28 ENCOUNTER — Other Ambulatory Visit: Payer: Self-pay

## 2019-06-28 ENCOUNTER — Encounter: Payer: Self-pay | Admitting: Rheumatology

## 2019-06-28 ENCOUNTER — Ambulatory Visit: Payer: Medicare PPO | Admitting: Rheumatology

## 2019-06-28 ENCOUNTER — Telehealth: Payer: Self-pay | Admitting: Pharmacist

## 2019-06-28 VITALS — BP 145/80 | HR 84 | Resp 18 | Ht 60.0 in | Wt 104.0 lb

## 2019-06-28 DIAGNOSIS — C3411 Malignant neoplasm of upper lobe, right bronchus or lung: Secondary | ICD-10-CM

## 2019-06-28 DIAGNOSIS — I7300 Raynaud's syndrome without gangrene: Secondary | ICD-10-CM

## 2019-06-28 DIAGNOSIS — R29890 Loss of height: Secondary | ICD-10-CM | POA: Diagnosis not present

## 2019-06-28 DIAGNOSIS — M810 Age-related osteoporosis without current pathological fracture: Secondary | ICD-10-CM

## 2019-06-28 DIAGNOSIS — I10 Essential (primary) hypertension: Secondary | ICD-10-CM

## 2019-06-28 DIAGNOSIS — M4125 Other idiopathic scoliosis, thoracolumbar region: Secondary | ICD-10-CM

## 2019-06-28 DIAGNOSIS — L659 Nonscarring hair loss, unspecified: Secondary | ICD-10-CM

## 2019-06-28 DIAGNOSIS — M65331 Trigger finger, right middle finger: Secondary | ICD-10-CM

## 2019-06-28 DIAGNOSIS — T84010D Broken internal right hip prosthesis, subsequent encounter: Secondary | ICD-10-CM | POA: Diagnosis not present

## 2019-06-28 DIAGNOSIS — M0609 Rheumatoid arthritis without rheumatoid factor, multiple sites: Secondary | ICD-10-CM

## 2019-06-28 DIAGNOSIS — M3500 Sicca syndrome, unspecified: Secondary | ICD-10-CM | POA: Diagnosis not present

## 2019-06-28 DIAGNOSIS — D638 Anemia in other chronic diseases classified elsewhere: Secondary | ICD-10-CM

## 2019-06-28 DIAGNOSIS — E7800 Pure hypercholesterolemia, unspecified: Secondary | ICD-10-CM

## 2019-06-28 DIAGNOSIS — Z79899 Other long term (current) drug therapy: Secondary | ICD-10-CM

## 2019-06-28 DIAGNOSIS — Z96641 Presence of right artificial hip joint: Secondary | ICD-10-CM

## 2019-06-28 DIAGNOSIS — M65341 Trigger finger, right ring finger: Secondary | ICD-10-CM

## 2019-06-28 DIAGNOSIS — E270 Other adrenocortical overactivity: Secondary | ICD-10-CM

## 2019-06-28 NOTE — Telephone Encounter (Signed)
Please start BIV for IV Orencia for rheumatoid arthritis.  Prior therapy includes: Plaquenil (d/c due to macular changes), MTX (d/c due to elevated LFT's), Arava (d/c due to significant hair loss).   Mariella Saa, PharmD, [place], [place] Clinical Specialty Pharmacist [phone]  06/28/2019 11:45 AM

## 2019-06-28 NOTE — Patient Instructions (Signed)
[name]

## 2019-06-28 NOTE — Progress Notes (Signed)
Pharmacy Note  Subjective: Patient presents today to [facility] for follow up office visit.  Patient seen by the pharmacist for counseling on intraveneous Orencia for rheumatoid arthritis.  Prior therapy includes: Plaquenil (d/c due to macular changes), MTX (d/c due to elevated LFT's), Arava (d/c due to significant hair loss).  Objective: CBC    Component Value Date/Time   WBC 4.7 06/18/2019 0104   RBC 2.11 (L) 06/18/2019 0104   HGB 10.9 (L) 06/18/2019 1137   HGB 9.9 (L) 03/10/2019 1052   HGB 11.0 (L) 08/21/2016 1125   HCT 33.5 (L) 06/18/2019 1137   HCT 33.6 (L) 08/21/2016 1125   PLT 128 (L) 06/18/2019 0104   PLT 257 03/10/2019 1052   PLT 160 08/21/2016 1125   PLT 150 09/22/2008 0000   MCV 100.0 06/18/2019 0104   MCV 88.9 08/21/2016 1125   MCH 29.9 06/18/2019 0104   MCHC 29.9 (L) 06/18/2019 0104   RDW 13.2 06/18/2019 0104   RDW 13.0 08/21/2016 1125   LYMPHSABS 857 05/03/2019 1038   LYMPHSABS 1.0 08/21/2016 1125   MONOABS 0.7 03/10/2019 1052   MONOABS 0.5 08/21/2016 1125   EOSABS 51 05/03/2019 1038   EOSABS 0.0 08/21/2016 1125   BASOSABS 31 05/03/2019 1038   BASOSABS 0.0 08/21/2016 1125    CMP     Component Value Date/Time   NA 133 (L) 06/18/2019 0104   NA 130 (L) 08/21/2016 1125   K 5.1 06/18/2019 1525   K 4.8 08/21/2016 1125   CL 106 06/18/2019 0104   CO2 22 06/18/2019 0104   CO2 26 08/21/2016 1125   GLUCOSE 86 06/18/2019 0104   GLUCOSE 82 08/21/2016 1125   GLUCOSE 85 09/22/2008 0000   BUN 32 (H) 06/18/2019 0104   BUN 25.6 08/21/2016 1125   CREATININE 0.92 06/18/2019 0104   CREATININE 1.08 (H) 05/03/2019 1038   CREATININE 1.2 (H) 08/21/2016 1125   CALCIUM 7.5 (L) 06/18/2019 0104   CALCIUM 10.0 08/21/2016 1125   PROT 4.7 (L) 06/18/2019 0104   PROT 7.4 08/21/2016 1125   ALBUMIN 2.7 (L) 06/18/2019 0104   ALBUMIN 4.0 08/21/2016 1125   AST 21 06/18/2019 0104   AST 29 03/10/2019 1052   AST 27 08/21/2016 1125   ALT 12 06/18/2019 0104   ALT 16  03/10/2019 1052   ALT 13 08/21/2016 1125   ALKPHOS 22 (L) 06/18/2019 0104   ALKPHOS 54 08/21/2016 1125   BILITOT 0.4 06/18/2019 0104   BILITOT <0.2 (L) 03/10/2019 1052   BILITOT 0.31 08/21/2016 1125   GFRNONAA 56 (L) 06/18/2019 0104   GFRNONAA 46 (L) 05/03/2019 1038   GFRAA >60 06/18/2019 0104   GFRAA 54 (L) 05/03/2019 1038    Baseline Immunosuppressant Therapy Labs TB GOLD   Hepatitis Panel Hepatitis Latest Ref Rng & Units 10/21/2018  Hep B Surface Ag NON-REACTI NON-REACTIVE  Hep B IgM NON-REACTI NON-REACTIVE   HIV Lab Results  Component Value Date   HIV NON-REACTIVE 10/21/2018   Immunoglobulins Immunoglobulin Electrophoresis Latest Ref Rng & Units 10/21/2018  IgA  70 - 320 mg/dL 59(L)  IgG 600 - 1,540 mg/dL 1,353  IgM 50 - 300 mg/dL 87   SPEP Serum Protein Electrophoresis Latest Ref Rng & Units 06/18/2019  Total Protein 6.5 - 8.1 g/dL 4.7(L)   G6PD No results found for: G6PDH TPMT No results found for: TPMT   Chest x-ray: Postoperative changes and volume loss in the right hemi thorax with a small amount of right apical pleural fluid. 09/08/2013  Does patient have a diagnosis of COPD? No  Does patient have history of diverticulitis?  No  Assessment/Plan:  Counseled patient that Maureen Chatters is a selective T-cell costimulation blocker indicated for rheumatoid arthritis.  Counseled patient on purpose, proper use, and adverse effects of Orencia. The most common adverse effects are increased risk of infections, headache, and infusion reactions.  There is the possibility of an increased risk of malignancy but it is not well understood if this increased risk is due to the medication or the disease state.  Reviewed the importance of regular labs while on Orencia therapy.  Counseled patient that Maureen Chatters should be held prior to scheduled surgery.  Counseled patient to avoid live vaccines while on Orencia.  Advised patient to get annual influenza vaccine and the pneumococcal vaccine as  indicated.  Provided patient with medication education material and answered all questions.  Patient consented to [facility].  Will upload consent into patient's chart.  Will submit benefit's investigation for Orencia.  All questions encouraged and answered.  Instructed patient to call with any questions or concerns.  Mariella Saa, PharmD, [place], [place] Clinical Specialty Pharmacist [phone]  06/28/2019 11:32 AM

## 2019-06-29 ENCOUNTER — Telehealth: Payer: Self-pay | Admitting: Gastroenterology

## 2019-06-29 NOTE — Telephone Encounter (Signed)
I have given patient the correct address to our lab and she said she will go have blood work drawn. They did not draw a CBC at her rheumatologist yesterday.

## 2019-07-01 LAB — SJOGRENS SYNDROME-A EXTRACTABLE NUCLEAR ANTIBODY: SSA (Ro) (ENA) Antibody, IgG: <1 AI

## 2019-07-01 LAB — QUANTIFERON-TB GOLD PLUS
Mitogen-NIL: >10 IU/mL
NIL: 0.08 IU/mL
QuantiFERON-TB Gold Plus: NEGATIVE
TB1-NIL: 0.03 IU/mL
TB2-NIL: 0.07 IU/mL

## 2019-07-01 LAB — ANA: Anti Nuclear Antibody (ANA): POSITIVE — AB

## 2019-07-01 LAB — ANTI-SCLERODERMA ANTIBODY: Scleroderma (Scl-70) (ENA) Antibody, IgG: <1 AI

## 2019-07-01 LAB — ANTI-DNA ANTIBODY, DOUBLE-STRANDED: ds DNA Ab: 2 IU/mL

## 2019-07-01 LAB — ANTI-SMITH ANTIBODY: ENA SM Ab Ser-aCnc: <1 AI

## 2019-07-01 LAB — C3 AND C4
C3 Complement: 138 mg/dL
C4 Complement: 33 mg/dL

## 2019-07-01 LAB — RNP ANTIBODY: Ribonucleic Protein(ENA) Antibody, IgG: <1 AI

## 2019-07-01 LAB — ANTI-NUCLEAR AB-TITER (ANA TITER): ANA Titer 1: 1:320 {titer} — ABNORMAL HIGH

## 2019-07-01 LAB — SJOGRENS SYNDROME-B EXTRACTABLE NUCLEAR ANTIBODY: SSB (La) (ENA) Antibody, IgG: <1 AI

## 2019-07-01 NOTE — Progress Notes (Signed)
ANA is positive.  All other autoimmune work-up is negative.  TB gold is negative.  The plan is to start her on IV Orencia.

## 2019-07-05 ENCOUNTER — Encounter: Payer: Self-pay | Admitting: Family Medicine

## 2019-07-05 ENCOUNTER — Other Ambulatory Visit (INDEPENDENT_AMBULATORY_CARE_PROVIDER_SITE_OTHER): Payer: Medicare PPO

## 2019-07-05 DIAGNOSIS — K921 Melena: Secondary | ICD-10-CM | POA: Diagnosis not present

## 2019-07-05 LAB — CBC WITH DIFFERENTIAL/PLATELET
Basophils Absolute: 0 10*3/uL (ref 0.0–0.1)
Basophils Relative: 0.7 % (ref 0.0–3.0)
Eosinophils Absolute: 0.1 10*3/uL (ref 0.0–0.7)
Eosinophils Relative: 1.1 % (ref 0.0–5.0)
HCT: 35.7 % — ABNORMAL LOW (ref 36.0–46.0)
Hemoglobin: 11.9 g/dL — ABNORMAL LOW (ref 12.0–15.0)
Lymphocytes Relative: 24 % (ref 12.0–46.0)
Lymphs Abs: 1.1 10*3/uL (ref 0.7–4.0)
MCHC: 33.3 g/dL (ref 30.0–36.0)
MCV: 89.7 fl (ref 78.0–100.0)
Monocytes Absolute: 0.6 10*3/uL (ref 0.1–1.0)
Monocytes Relative: 12.6 % — ABNORMAL HIGH (ref 3.0–12.0)
Neutro Abs: 2.9 10*3/uL (ref 1.4–7.7)
Neutrophils Relative %: 61.6 % (ref 43.0–77.0)
Platelets: 190 10*3/uL (ref 150.0–400.0)
RBC: 3.98 Mil/uL (ref 3.87–5.11)
RDW: 14.4 % (ref 11.5–15.5)
WBC: 4.8 10*3/uL (ref 4.0–10.5)

## 2019-07-06 NOTE — Telephone Encounter (Signed)
Called [employer] to verify benefits for Orencia IV- [id number].  Benefits- $0 copay and no deductible for plan. Patient's Max OOP is $4000, she has met $551.26. Prior Approval is required.  Rep/Ref# Tiajuana Amass [id number]  Spoke to [facility] clinical intake team to submit Pre-certification. Orencia IV has required step therapy: Patient must have tried and failed ALL formulary preferred: Inflectra, Simponi Aria, and Stelara.  WUG#[id number] Phone# [phone]  Please advise.

## 2019-07-06 NOTE — Telephone Encounter (Signed)
Patient is not a candidate for anti-TNF (Inflectra and Simponi) due to history of cutaneous lupus.  Treatment with anti-TNF can lead to flare and uncontrolled lupus.  Stelara is not an FDA approved treatment for RA.

## 2019-07-06 NOTE — Telephone Encounter (Signed)
Submitted a Prior Authorization request to [facility] for [facility] IV via Phone. Will update once we receive a response.  Determination will be faxed to the office.  EOC# [id number] Phone# [phone]

## 2019-07-07 DIAGNOSIS — Z681 Body mass index (BMI) 19 or less, adult: Secondary | ICD-10-CM | POA: Diagnosis not present

## 2019-07-07 DIAGNOSIS — M5136 Other intervertebral disc degeneration, lumbar region: Secondary | ICD-10-CM | POA: Diagnosis not present

## 2019-07-07 DIAGNOSIS — M47816 Spondylosis without myelopathy or radiculopathy, lumbar region: Secondary | ICD-10-CM | POA: Diagnosis not present

## 2019-07-07 DIAGNOSIS — I10 Essential (primary) hypertension: Secondary | ICD-10-CM | POA: Diagnosis not present

## 2019-07-07 NOTE — Telephone Encounter (Signed)
Received notification from [facility] regarding a prior authorization for [place]. Authorization has been APPROVED from 07/06/19 to 02/17/20.   Authorization # [id number] Phone # [phone]

## 2019-07-08 ENCOUNTER — Telehealth: Payer: Self-pay | Admitting: Rheumatology

## 2019-07-08 NOTE — Telephone Encounter (Signed)
We received a fax confirmation yesterday stating Orencia infusion was approved.  Called to notify patient.  Advised her dose will be Orencia at 0, 2, 4 weeks and then every 4 weeks.  She can get labs with her infusions.  Patient verbalized understanding.  I am out of the office today and without phone number to [facility] medical day to give an order for patient to schedule infusion.  We will follow-up with patient on Monday with number to schedule infusion in place orders.  Mariella Saa, PharmD, [place], CPP Clinical Specialty Pharmacist (Rheumatology and Pulmonology)  07/08/2019 12:53 PM

## 2019-07-08 NOTE — Telephone Encounter (Signed)
Patient called requesting a return call to let her know if her Orencia IV has been approved.

## 2019-07-12 ENCOUNTER — Other Ambulatory Visit: Payer: Self-pay | Admitting: Pharmacist

## 2019-07-12 DIAGNOSIS — M069 Rheumatoid arthritis, unspecified: Secondary | ICD-10-CM

## 2019-07-12 NOTE — Telephone Encounter (Signed)
Orders placed for Orencia infusion at [facility] Day.  Called patient to advise and gave number to call to schedule. [place], PharmD, BCACP, CPP Clinical Specialty Pharmacist (Rheumatology and Pulmonology)  07/12/2019 9:11 AM

## 2019-07-12 NOTE — Progress Notes (Signed)
Orders place for Orencia 500 mg every 2 weeks for 3 doses for loading dose. (0,2,4wks). CBC/CMP order placed to be drawn with the 3rd dose.  Next order for maintenance dose of 500 mg every 4 weeks pending labs.   Mariella Saa, PharmD, [place], CPP Clinical Specialty Pharmacist (Rheumatology and Pulmonology)  07/12/2019 9:05 AM

## 2019-07-14 ENCOUNTER — Other Ambulatory Visit: Payer: Self-pay | Admitting: Rheumatology

## 2019-07-14 NOTE — Telephone Encounter (Signed)
Last Visit: 06/28/2019 Next Visit: 01/26/2020 Labs: 07/05/2019 Hgb 11.9, Hct 35.7, Monocytes Relative 12.6 06/18/2019 Sodium 133, Potassium 5.5, BUN 32, Calcium 7.5, total protein 4.7 Albumin 2.7, alk phos 22 GFR 56  Current Dose per office note 06/28/2019: Arava to 20 mg 1 tablet alternating with 1/2 tablet by mouth every other day  Okay to refill Arava?

## 2019-07-14 NOTE — Telephone Encounter (Signed)
Patient was supposed to start on Orencia.  She was supposed to discontinue Arava as she was having side effects.

## 2019-07-25 ENCOUNTER — Encounter: Payer: Self-pay | Admitting: Family Medicine

## 2019-07-25 ENCOUNTER — Other Ambulatory Visit: Payer: Self-pay | Admitting: Orthopaedic Surgery

## 2019-07-25 NOTE — Telephone Encounter (Signed)
Ok to refill 

## 2019-07-27 ENCOUNTER — Other Ambulatory Visit: Payer: Self-pay | Admitting: *Deleted

## 2019-07-27 NOTE — Progress Notes (Signed)
Infusion orders are current for patient CBC CMP Tylenol Benadryl appointments are up to date and follow up appointment  is scheduled TB gold not due yet, due on 06/27/2020.

## 2019-08-03 ENCOUNTER — Ambulatory Visit (HOSPITAL_COMMUNITY)
Admission: RE | Admit: 2019-08-03 | Discharge: 2019-08-03 | Disposition: A | Discharge status: Home / Self Care | Payer: Medicare PPO | Source: Ambulatory Visit | Attending: Rheumatology | Admitting: Rheumatology

## 2019-08-03 DIAGNOSIS — M069 Rheumatoid arthritis, unspecified: Secondary | ICD-10-CM | POA: Insufficient documentation

## 2019-08-03 MED ORDER — ACETAMINOPHEN 325 MG PO TABS: 650.0000 mg | ORAL_TABLET | Freq: Once | ORAL | Status: DC

## 2019-08-03 MED ORDER — DIPHENHYDRAMINE HCL 25 MG PO CAPS
ORAL_CAPSULE | ORAL | Status: AC
  Filled 2019-08-03: qty 1

## 2019-08-03 MED ORDER — SODIUM CHLORIDE 0.9 % IV SOLN
500.0000 mg | Freq: Once | INTRAVENOUS | Status: AC
  Administered 2019-08-03: 500 mg via INTRAVENOUS
  Filled 2019-08-03: qty 20

## 2019-08-03 MED ORDER — SODIUM CHLORIDE 0.9 % IV SOLN: INTRAVENOUS | Status: DC

## 2019-08-03 MED ORDER — DIPHENHYDRAMINE HCL 25 MG PO CAPS
25.0000 mg | ORAL_CAPSULE | Freq: Once | ORAL | Status: AC
  Administered 2019-08-03: 25 mg via ORAL

## 2019-08-03 NOTE — Progress Notes (Deleted)
[place] at [facility] [address] [place], [place] [address] [phone]  Date:  08/04/2019   Name:  [name]   DOB:  07-01-1930   MRN:  [id number]  PCP:  Darreld Mclean, MD    Chief Complaint: No chief complaint on file.   History of Present Illness:  [name] is a 86 y.o. very pleasant female patient who presents with the following:  Elderly lady here today for a follow-up visit.  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 Bryan Lemma for outpatient GI follow-up Thankfully she has recovered from this episode, her most recent hemoglobin about 1 month ago was 11.9  She saw her rheumatologist, Dr. Estanislado Pandy on May 11-they plan to start her on IV Orencia for rheumatoid arthritis  Today [name] is concerned about inflammation of her eyes and ears  COVID-19 vaccine  Patient Active Problem List   Diagnosis Date Noted  . Acute posthemorrhagic anemia   . Rectal bleed 06/17/2019  . Grade II internal hemorrhoids   . Rectal ulcer   . Popliteal artery occlusion, right ([place]) 04/10/2018  . HTN (hypertension) 04/10/2018  . Femoral artery pseudo-aneurysm, right ([place]) 04/09/2018  . Anemia of chronic disease 02/24/2018  . Unstable right hip arthroplasty 01/24/2018  . History of revision of total replacement of right hip joint 01/24/2018  . Hypovolemic shock ([place])   . Hyperkalemia   . Hyponatremia   . Failed total hip arthroplasty ([place]) 11/27/2017  . Status post revision of total  hip 11/27/2017  . Elevated cholesterol 10/11/2015  . 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[place]) 10/04/2014  . Bilateral leg edema 08/01/2014  . Elevated BP 08/01/2014  . Rheumatoid arthritis involving multiple joints ([place]) 05/30/2014  . Status post total replacement of right hip 04/28/2014  . Long-term use of high-risk medication 11/11/2013  . Symptomatic anemia 11/11/2013  . Neuropathic pain 07/07/2013  . Constipation due to pain medication 07/07/2013  . Protein-calorie malnutrition, severe ([place]) 06/08/2013  . Lung cancer, Right upper lobe 05/08/2013  . Sciatica of right side 08/13/2011  . Osteoporosis 03/07/2010  . PARESTHESIA 03/07/2010  . CT, CHEST, ABNORMAL 12/18/2008  . ABNORMAL ECHOCARDIOGRAM 12/14/2008  . [place] SYNDROME 11/29/2008  . HYPOGLYCEMIA 06/29/2006  . RAYNAUD'S DISEASE 06/29/2006    Past Medical History:  Diagnosis Date  . Arthralgia of multiple joints    followed by dr Gerilyn Nestle  . Arthritis   . Cardiomyopathy ([place])   . Chronic constipation   . Chronic inflammatory arthritis    rhemotolgist-  dr a. Gerilyn Nestle [facility])  . Dry eyes    eye drops used   . GERD (gastroesophageal reflux disease)   . H/O discoid lupus erythematosus   . Hiatal hernia   . History of colon polyps   . Hypothyroidism   . Iron deficiency anemia   . LBBB (left bundle branch block) 2010  . Mitchell's disease (erythromelalgia) [facility])    neurologist-  dr patel  . Nocturia   . Non-small cell cancer of right lung [facility]) surgeon-- dr gerhardt/  oncologist-  dr Julien Nordmann--- per lov notes no recurrence/   11-18-2017 per pt denies any symptoms   dx 2015--  Stage IIA (T2b,N0,M0) , +EGFR  mutation in exon 21, non-small cell adenocarcinoma right upper lobe---  s/p  Right upper lobectomy , right middley wedge resection and node dissection---  no chemo or radiation therapy  . OA (osteoarthritis)    hands  . Osteoporosis   . PONV (postoperative nausea and vomiting)    likes phenergan  .  Raynaud's phenomenon 1965  . Renal insufficiency   . Rheumatoid arthritis ([place])   . Sciatica   . Scoliosis   . Sjogren's syndrome [facility])     Past Surgical History:  Procedure Laterality Date  . ANTERIOR HIP REVISION Right 11/27/2017   Procedure: RIGHT HIP ACETABULAR REVISION;  Surgeon: [name], MD;  Location: [facility];  Service: Orthopedics;  Laterality: Right;  . ANTERIOR HIP REVISION Right 01/24/2018   Procedure: OPEN REDUCTION OF DISLOCATED ANTERIOR HIP WITH REVISION OF LINER AND HIP BALL;  Surgeon: [name], MD;  Location: [facility];  Service: Orthopedics;  Laterality: Right;  . APPENDECTOMY  1950s  . BIOPSY  04/14/2018   Procedure: BIOPSY;  Surgeon: Yetta Flock, MD;  Location: [place];  Service: Gastroenterology;;  . BIOPSY  04/16/2018   Procedure: BIOPSY;  Surgeon: Irving Copas., MD;  Location: [facility];  Service: Gastroenterology;;  . CARDIOVASCULAR STRESS TEST  12/2008    mild fixed basal to mid septal perfusion defect felt likely due to artifact from LBBB, no ischemia, EF 58%  . COLONOSCOPY    . COLONOSCOPY WITH PROPOFOL N/A 04/16/2018   Procedure: COLONOSCOPY WITH PROPOFOL;  Surgeon: Rush Landmark Telford Nab., MD;  Location: [place];  Service: Gastroenterology;  Laterality: N/A;  . ESOPHAGOGASTRODUODENOSCOPY (EGD) WITH PROPOFOL N/A 04/14/2018   Procedure: ESOPHAGOGASTRODUODENOSCOPY (EGD) WITH PROPOFOL;  Surgeon: Yetta Flock, MD;  Location: [place];  Service: Gastroenterology;  Laterality: N/A;  . FEMORAL-POPLITEAL BYPASS GRAFT Right 04/10/2018   Procedure: REPAIR RIGHT FEMORAL ARTERY PSEUDOANEURYSM, RETROPERITONEAL EXPOSURE OF ILIAC ARTERY, RIGHT POPLITEAL EMBOLECTOMY;  Surgeon: Angelia Mould, MD;  Location: [place];  Service: Vascular;  Laterality: Right;  . FLEXIBLE SIGMOIDOSCOPY N/A 06/17/2019   Procedure: FLEXIBLE SIGMOIDOSCOPY;  Surgeon: Lavena Bullion, DO;  [facility];  Service:  Gastroenterology;  Laterality: N/A;  . HEMOSTASIS CLIP PLACEMENT  06/17/2019   Procedure: HEMOSTASIS CLIP PLACEMENT;  Surgeon: Lavena Bullion, DO;  [facility];  Service: Gastroenterology;;  . LYMPH NODE DISSECTION Right 06/07/2013   Procedure: LYMPH NODE DISSECTION;  Surgeon: [name], MD;  Location: [place];  Service: Thoracic;  Laterality: Right;  . PATCH ANGIOPLASTY Right 04/10/2018   Procedure: PATCH  ANGIOPLASTY OF RIGHT FEMORAL ARTERY USING BOVINE PATCH, PATCH ANGIOPLASTY OF RIGHT POPLITEAL ARTERY USING BOVINE PATCH;  Surgeon: Angelia Mould, MD;  Location: [place];  Service: Vascular;  Laterality: Right;  . SCHLEROTHERAPY  06/17/2019   Procedure: SCHLEROTHERAPY OF VARICES;  Surgeon: Lavena Bullion, DO;  [facility];  Service: Gastroenterology;;  . [place]   "large incision from chest to up to shoulder, the nerves were tied together, for raynaud's  . THORACOTOMY  06/07/2013   Procedure: MINI/LIMITED THORACOTOMY; right middle lobe wedge resection;  Surgeon: [name], MD;  Location: [place];  Service: Thoracic;;  . TONSILLECTOMY  child  . TOTAL ABDOMINAL HYSTERECTOMY  1980's    W/ BSO  . TOTAL HIP ARTHROPLASTY Right 04/28/2014  Procedure: RIGHT TOTAL HIP ARTHROPLASTY ANTERIOR APPROACH;  Surgeon: [name], MD;  [facility];  Service: Orthopedics;  Laterality: Right;  . TRANSTHORACIC ECHOCARDIOGRAM  12/11/2008   ef 59-16%, grade 1 diastolic dysfunction/  mild LAE/  mild AR and MR/  trivial TR  . VIDEO ASSISTED THORACOSCOPY (VATS)/WEDGE RESECTION Right 06/07/2013   Procedure: VIDEO ASSISTED THORACOSCOPY (VATS)/right upper lobectomy, On Q;  Surgeon: [name], MD;  Location: [place];  Service: Thoracic;  Laterality: Right;  [place] VIDEO BRONCHOSCOPY N/A 06/07/2013   Procedure: VIDEO BRONCHOSCOPY;  Surgeon: [name], MD;  [facility] OR;  Service: Thoracic;  Laterality: N/A;  . VIDEO BRONCHOSCOPY  WITH ENDOBRONCHIAL NAVIGATION N/A 05/04/2013   Procedure: VIDEO BRONCHOSCOPY WITH ENDOBRONCHIAL NAVIGATION;  Surgeon: [name], MD;  Location: [place];  Service: Thoracic;  Laterality: N/A;    Social History   Tobacco Use  . Smoking status: Never Smoker  . Smokeless tobacco: Never Used  Vaping Use  . Vaping Use: Never used  Substance Use Topics  . Alcohol use: Not Currently  . Drug use: Never    Family History  Problem Relation Age of Onset  . Coronary artery disease Father   . Colon cancer Father   . Diabetes Father   . Cancer Father        colon  . Other Mother 52       MVA  . Healthy Sister   . Healthy Brother   . Healthy Daughter   . Hypothyroidism Daughter   . Other Brother        killed in war  . Pneumonia Sister   . Healthy Daughter   . Esophageal cancer Neg Hx   . Kidney disease Neg Hx   . Liver disease Neg Hx     Allergies  Allergen Reactions  . Amlodipine Rash  . Prochlorperazine Edisylate Anaphylaxis    Compazine--- tongue swells and rash  . Aspirin Other (See Comments)    nose bleeds. Cannot take NSAIDS   . Cymbalta [Duloxetine Hcl] Diarrhea, Nausea And Vomiting and Other (See Comments)    Increased blood pressure  . Pamelor [Nortriptyline Hcl] Diarrhea and Nausea Only    Increased Heart rate and BP    Medication list has been reviewed and updated.  Current Outpatient Medications on File Prior to Visit  Medication Sig Dispense Refill  . Acetaminophen (TYLENOL EXTRA STRENGTH PO) Take 1-2 tablets by mouth every 6 (six) hours as needed (pain).     . Biotin 1000 MCG tablet Take 1,000 mcg by mouth daily.     . Calcium Carbonate-Vitamin D (CALCIUM 500 + D) 500-125 MG-UNIT TABS Take 1 tablet by mouth daily.     . carvedilol (COREG) 3.125 MG tablet TAKE 1 TABLET (3.125 MG TOTAL) BY MOUTH 2 (TWO) TIMES DAILY. 180 tablet 3  . denosumab (PROLIA) 60 MG/ML SOSY injection Inject 60 mg into the skin every 6 (six) months. Last injection 04-22-2019    .  Diclofenac Sodium 3 % GEL Apply a pea sized amount to affected joint twice daily as needed. (Patient taking differently: Apply 1 application topically 2 (two) times daily as needed (pain). Apply a pea sized amount to affected joint twice daily as needed.) 100 g 2  . docusate sodium (STOOL SOFTENER) 100 MG capsule Take 100 mg by mouth 2 (two) times daily.      Marland Kitchen gabapentin (NEURONTIN) 300 MG capsule TAKE 1 CAPSULE BY MOUTH IN THE MORNING, 1 IN THE AFTERNOON,  AND 2 AT BEDTIME 360 capsule 1  . Glucosamine-Chondroit-Vit C-Mn (GLUCOSAMINE CHONDR 1500 COMPLX PO) Take 1 capsule by mouth daily.     Marland Kitchen leflunomide (ARAVA) 10 MG tablet TAKE 1 TABLET BY MOUTH EVERY DAY 90 tablet 0  . levothyroxine (SYNTHROID) 75 MCG tablet Take 1 tablet (75 mcg total) by mouth daily before breakfast. 30 tablet 5  . Menthol, Topical Analgesic, (BIOFREEZE EX) Apply 1 application topically daily as needed (pain).    . methocarbamol (ROBAXIN) 500 MG tablet TAKE 1 TABLET (500 MG TOTAL) BY MOUTH EVERY 6 (SIX) HOURS AS NEEDED FOR MUSCLE SPASMS. 40 tablet 0  . Multiple Vitamin (MULTIVITAMIN) tablet Take 1 tablet by mouth daily.      Marland Kitchen omeprazole (PRILOSEC) 20 MG capsule TAKE 1 CAPSULE BY MOUTH 2 TIMES DAILY BEFORE A MEAL. (Patient taking differently: Take 20 mg by mouth daily. ) 180 capsule 1  . Probiotic Product (PROBIOTIC PO) Take 1 capsule by mouth daily.      No current facility-administered medications on file prior to visit.    Review of Systems:  As per HPI- otherwise negative.   Physical Examination: There were no vitals filed for this visit. There were no vitals filed for this visit. There is no height or weight on file to calculate BMI. Ideal Body Weight:    GEN: no acute distress. HEENT: Atraumatic, Normocephalic.  Ears and Nose: No external deformity. CV: RRR, No M/G/R. No JVD. No thrill. No extra heart sounds. PULM: CTA B, no wheezes, crackles, rhonchi. No retractions. No resp. distress. No accessory muscle  use. ABD: S, NT, ND, +BS. No rebound. No HSM. EXTR: No c/c/e PSYCH: Normally interactive. Conversant.    Assessment and Plan: ***  Signed Lamar Blinks, MD

## 2019-08-04 ENCOUNTER — Ambulatory Visit: Payer: Medicare PPO | Admitting: Family Medicine

## 2019-08-17 ENCOUNTER — Ambulatory Visit (HOSPITAL_COMMUNITY)
Admission: RE | Admit: 2019-08-17 | Discharge: 2019-08-17 | Disposition: A | Discharge status: Home / Self Care | Payer: Medicare PPO | Source: Ambulatory Visit | Attending: Rheumatology | Admitting: Rheumatology

## 2019-08-17 ENCOUNTER — Other Ambulatory Visit: Payer: Self-pay

## 2019-08-17 DIAGNOSIS — M069 Rheumatoid arthritis, unspecified: Secondary | ICD-10-CM | POA: Insufficient documentation

## 2019-08-17 LAB — CBC
HCT: 35.7 % — ABNORMAL LOW (ref 36.0–46.0)
Hemoglobin: 11.4 g/dL — ABNORMAL LOW (ref 12.0–15.0)
MCH: 29.9 pg (ref 26.0–34.0)
MCHC: 31.9 g/dL (ref 30.0–36.0)
MCV: 93.7 fL (ref 80.0–100.0)
Platelets: 179 10*3/uL (ref 150–400)
RBC: 3.81 MIL/uL — ABNORMAL LOW (ref 3.87–5.11)
RDW: 13.4 % (ref 11.5–15.5)
WBC: 4.6 10*3/uL (ref 4.0–10.5)
nRBC: 0 % (ref 0.0–0.2)

## 2019-08-17 LAB — COMPREHENSIVE METABOLIC PANEL
ALT: 17 U/L (ref 0–44)
AST: 29 U/L (ref 15–41)
Albumin: 3.9 g/dL (ref 3.5–5.0)
Alkaline Phosphatase: 33 U/L — ABNORMAL LOW (ref 38–126)
Anion gap: 7 (ref 5–15)
BUN: 26 mg/dL — ABNORMAL HIGH (ref 8–23)
CO2: 25 mmol/L (ref 22–32)
Calcium: 9 mg/dL (ref 8.9–10.3)
Chloride: 103 mmol/L (ref 98–111)
Creatinine, Ser: 1.02 mg/dL — ABNORMAL HIGH (ref 0.44–1.00)
GFR calc Af Amer: 58 mL/min — ABNORMAL LOW (ref >60)
GFR calc non Af Amer: 50 mL/min — ABNORMAL LOW (ref >60)
Glucose, Bld: 99 mg/dL (ref 70–99)
Potassium: 4.9 mmol/L (ref 3.5–5.1)
Sodium: 135 mmol/L (ref 135–145)
Total Bilirubin: 0.5 mg/dL (ref 0.3–1.2)
Total Protein: 6.6 g/dL (ref 6.5–8.1)

## 2019-08-17 MED ORDER — SODIUM CHLORIDE 0.9 % IV SOLN
500.0000 mg | INTRAVENOUS | Status: DC
  Administered 2019-08-17: 500 mg via INTRAVENOUS
  Filled 2019-08-17: qty 20

## 2019-08-17 MED ORDER — DIPHENHYDRAMINE HCL 25 MG PO CAPS: 25.0000 mg | ORAL_CAPSULE | ORAL | Status: DC

## 2019-08-17 MED ORDER — DIPHENHYDRAMINE HCL 25 MG PO CAPS
ORAL_CAPSULE | ORAL | Status: AC
  Administered 2019-08-17: 25 mg via ORAL
  Filled 2019-08-17: qty 1

## 2019-08-17 MED ORDER — ACETAMINOPHEN 325 MG PO TABS: 650.0000 mg | ORAL_TABLET | ORAL | Status: DC

## 2019-08-17 NOTE — Progress Notes (Signed)
CBC/CMP stable.  She received her second dose of Orencia IV today.

## 2019-08-20 ENCOUNTER — Other Ambulatory Visit: Payer: Self-pay | Admitting: Family Medicine

## 2019-08-27 ENCOUNTER — Other Ambulatory Visit: Payer: Self-pay | Admitting: Family Medicine

## 2019-08-29 ENCOUNTER — Encounter: Payer: Self-pay | Admitting: Family Medicine

## 2019-08-29 MED ORDER — LEVOTHYROXINE SODIUM 75 MCG PO TABS: 75.0000 ug | ORAL_TABLET | Freq: Every day | ORAL | 1 refills | Status: DC

## 2019-08-31 ENCOUNTER — Other Ambulatory Visit: Payer: Self-pay

## 2019-08-31 ENCOUNTER — Ambulatory Visit (HOSPITAL_COMMUNITY)
Admission: RE | Admit: 2019-08-31 | Discharge: 2019-08-31 | Disposition: A | Discharge status: Home / Self Care | Payer: Medicare PPO | Source: Ambulatory Visit | Attending: Rheumatology | Admitting: Rheumatology

## 2019-08-31 DIAGNOSIS — M069 Rheumatoid arthritis, unspecified: Secondary | ICD-10-CM | POA: Diagnosis not present

## 2019-08-31 MED ORDER — DIPHENHYDRAMINE HCL 25 MG PO CAPS: 25.0000 mg | ORAL_CAPSULE | ORAL | Status: AC

## 2019-08-31 MED ORDER — ACETAMINOPHEN 325 MG PO TABS: 650.0000 mg | ORAL_TABLET | ORAL | Status: DC

## 2019-08-31 MED ORDER — SODIUM CHLORIDE 0.9 % IV SOLN
500.0000 mg | INTRAVENOUS | Status: AC
  Administered 2019-08-31: 500 mg via INTRAVENOUS
  Filled 2019-08-31: qty 20

## 2019-08-31 MED ORDER — DIPHENHYDRAMINE HCL 25 MG PO CAPS
ORAL_CAPSULE | ORAL | Status: AC
  Administered 2019-08-31: 25 mg via ORAL
  Filled 2019-08-31: qty 1

## 2019-09-05 NOTE — Progress Notes (Signed)
[facility] [address] [place], [place] [address] [phone]  Date:  09/07/2019   Name:  [name]   DOB:  [birth year]   MRN:  [id number]  PCP:  Darreld Mclean, MD    Chief Complaint: Hypertension (6 month follow up), Hypothyroidism, and Sinusitis (ear pain, eye puffiness, congestion, one month now, unable to take antibioitc in the past due to infusion)   History of Present Illness:  [name] is a 86 y.o. very pleasant female patient who presents with the following:  Patient here today for a follow-up visit.  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 Evalyn Casco is now receiving IV Orencia for her rheumatoid arthritis She finished the 2 month loading phase and is now on monthly infusions  Today she notes that her ears have been a bit congested, and she may have some nasal congestion off and on.  She does not wish to use abx unless she has to as this may delay her infusion  Also, she notes a skin lesion on her left anterior chest for a few weeks- her daughter was worried it might be a skin cancer It is not painful in particular   Covid series- done  DEXA up-to-date Lab work done in June, [place] looked okay.  Minimal anemia Most recent TSH in April, in range   Patient Active Problem List   Diagnosis Date Noted  . Acute posthemorrhagic anemia   . Rectal bleed 06/17/2019  . Grade II internal hemorrhoids   . Rectal ulcer   . Popliteal artery occlusion, right ([place]) 04/10/2018  . HTN (hypertension) 04/10/2018  . Femoral artery pseudo-aneurysm, right ([place]) 04/09/2018  . Anemia of chronic disease 02/24/2018   . Unstable right hip arthroplasty 01/24/2018  . History of revision of total replacement of right hip joint 01/24/2018  . Hypovolemic shock ([place])   . Hyperkalemia   . Hyponatremia   . Failed total hip arthroplasty ([place]) 11/27/2017  . Status post revision of total hip 11/27/2017  . Elevated cholesterol 10/11/2015  . 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[place]) 10/04/2014  . Bilateral leg edema 08/01/2014  . Elevated BP 08/01/2014  . Rheumatoid arthritis involving multiple joints ([place]) 05/30/2014  . Status post total replacement of right hip 04/28/2014  . Long-term use of high-risk medication 11/11/2013  . Symptomatic anemia 11/11/2013  . Neuropathic pain 07/07/2013  . Constipation due to pain medication 07/07/2013  . Protein-calorie malnutrition, severe ([place]) 06/08/2013  . Lung cancer, Right upper lobe 05/08/2013  . Sciatica of right side 08/13/2011  . Osteoporosis 03/07/2010  . PARESTHESIA 03/07/2010  . CT, CHEST, ABNORMAL 12/18/2008  . ABNORMAL ECHOCARDIOGRAM 12/14/2008  . [place] SYNDROME 11/29/2008  . HYPOGLYCEMIA 06/29/2006  . RAYNAUD'S DISEASE 06/29/2006    Past Medical History:  Diagnosis Date  . Arthralgia of multiple joints    followed by dr Gerilyn Nestle  . Arthritis   . Cardiomyopathy ([place])   . Chronic constipation   . Chronic inflammatory arthritis    rhemotolgist-  dr a. Gerilyn Nestle [facility])  . Dry eyes    eye drops used   . GERD (gastroesophageal reflux disease)   . H/O  discoid lupus erythematosus   . Hiatal hernia   . History of colon polyps   . Hypothyroidism   . Iron deficiency anemia   . LBBB (left bundle branch block) 2010  . Mitchell's disease (erythromelalgia) [facility])    neurologist-  dr patel  . Nocturia   . Non-small cell cancer of right lung [facility]) surgeon-- dr gerhardt/  oncologist-  dr Julien Nordmann--- per lov notes no recurrence/   11-18-2017 per pt denies any symptoms   dx 2015--  Stage IIA (T2b,N0,M0) , +EGFR  mutation in exon 21, non-small cell  adenocarcinoma right upper lobe---  s/p  Right upper lobectomy , right middley wedge resection and node dissection---  no chemo or radiation therapy  . OA (osteoarthritis)    hands  . Osteoporosis   . PONV (postoperative nausea and vomiting)    likes phenergan  . Raynaud's phenomenon 1965  . Renal insufficiency   . Rheumatoid arthritis ([place])   . Sciatica   . Scoliosis   . Sjogren's syndrome [facility])     Past Surgical History:  Procedure Laterality Date  . ANTERIOR HIP REVISION Right 11/27/2017   Procedure: RIGHT HIP ACETABULAR REVISION;  Surgeon: [name], MD;  Location: [facility];  Service: Orthopedics;  Laterality: Right;  . ANTERIOR HIP REVISION Right 01/24/2018   Procedure: OPEN REDUCTION OF DISLOCATED ANTERIOR HIP WITH REVISION OF LINER AND HIP BALL;  Surgeon: [name], MD;  Location: [facility];  Service: Orthopedics;  Laterality: Right;  . APPENDECTOMY  1950s  . BIOPSY  04/14/2018   Procedure: BIOPSY;  Surgeon: Yetta Flock, MD;  Location: [place];  Service: Gastroenterology;;  . BIOPSY  04/16/2018   Procedure: BIOPSY;  Surgeon: Irving Copas., MD;  Location: [facility];  Service: Gastroenterology;;  . CARDIOVASCULAR STRESS TEST  12/2008    mild fixed basal to mid septal perfusion defect felt likely due to artifact from LBBB, no ischemia, EF 58%  . COLONOSCOPY    . COLONOSCOPY WITH PROPOFOL N/A 04/16/2018   Procedure: COLONOSCOPY WITH PROPOFOL;  Surgeon: Rush Landmark Telford Nab., MD;  Location: [place];  Service: Gastroenterology;  Laterality: N/A;  . ESOPHAGOGASTRODUODENOSCOPY (EGD) WITH PROPOFOL N/A 04/14/2018   Procedure: ESOPHAGOGASTRODUODENOSCOPY (EGD) WITH PROPOFOL;  Surgeon: Yetta Flock, MD;  Location: [place];  Service: Gastroenterology;  Laterality: N/A;  . FEMORAL-POPLITEAL BYPASS GRAFT Right 04/10/2018   Procedure: REPAIR RIGHT FEMORAL ARTERY PSEUDOANEURYSM, RETROPERITONEAL EXPOSURE OF ILIAC ARTERY, RIGHT  POPLITEAL EMBOLECTOMY;  Surgeon: Angelia Mould, MD;  Location: [place];  Service: Vascular;  Laterality: Right;  . FLEXIBLE SIGMOIDOSCOPY N/A 06/17/2019   Procedure: FLEXIBLE SIGMOIDOSCOPY;  Surgeon: Lavena Bullion, DO;  [facility];  Service: Gastroenterology;  Laterality: N/A;  . HEMOSTASIS CLIP PLACEMENT  06/17/2019   Procedure: HEMOSTASIS CLIP PLACEMENT;  Surgeon: Lavena Bullion, DO;  [facility];  Service: Gastroenterology;;  . LYMPH NODE DISSECTION Right 06/07/2013   Procedure: LYMPH NODE DISSECTION;  Surgeon: [name], MD;  Location: [place];  Service: Thoracic;  Laterality: Right;  . PATCH ANGIOPLASTY Right 04/10/2018   Procedure: PATCH  ANGIOPLASTY OF RIGHT FEMORAL ARTERY USING BOVINE PATCH, PATCH ANGIOPLASTY OF RIGHT POPLITEAL ARTERY USING BOVINE PATCH;  Surgeon: Angelia Mould, MD;  Location: [place];  Service: Vascular;  Laterality: Right;  . SCHLEROTHERAPY  06/17/2019   Procedure: SCHLEROTHERAPY OF VARICES;  Surgeon: Lavena Bullion, DO;  [facility];  Service: Gastroenterology;;  . [place]   "large incision from  chest to up to shoulder, the nerves were tied together, for raynaud's  . THORACOTOMY  06/07/2013   Procedure: MINI/LIMITED THORACOTOMY; right middle lobe wedge resection;  Surgeon: [name], MD;  Location: [place];  Service: Thoracic;;  . TONSILLECTOMY  child  . TOTAL ABDOMINAL HYSTERECTOMY  1980's    W/ BSO  . TOTAL HIP ARTHROPLASTY Right 04/28/2014   Procedure: RIGHT TOTAL HIP ARTHROPLASTY ANTERIOR APPROACH;  Surgeon: [name], MD;  [facility];  Service: Orthopedics;  Laterality: Right;  . TRANSTHORACIC ECHOCARDIOGRAM  12/11/2008   ef 47-42%, grade 1 diastolic dysfunction/  mild LAE/  mild AR and MR/  trivial TR  . VIDEO ASSISTED THORACOSCOPY (VATS)/WEDGE RESECTION Right 06/07/2013   Procedure: VIDEO ASSISTED THORACOSCOPY (VATS)/right upper lobectomy, On Q;   Surgeon: [name], MD;  Location: [place];  Service: Thoracic;  Laterality: Right;  Marland Kitchen VIDEO BRONCHOSCOPY N/A 06/07/2013   Procedure: VIDEO BRONCHOSCOPY;  Surgeon: [name], MD;  Location: [facility] OR;  Service: Thoracic;  Laterality: N/A;  . VIDEO BRONCHOSCOPY WITH ENDOBRONCHIAL NAVIGATION N/A 05/04/2013   Procedure: VIDEO BRONCHOSCOPY WITH ENDOBRONCHIAL NAVIGATION;  Surgeon: [name], MD;  Location: [place];  Service: Thoracic;  Laterality: N/A;    Social History   Tobacco Use  . Smoking status: Never Smoker  . Smokeless tobacco: Never Used  Vaping Use  . Vaping Use: Never used  Substance Use Topics  . Alcohol use: Not Currently  . Drug use: Never    Family History  Problem Relation Age of Onset  . Coronary artery disease Father   . Colon cancer Father   . Diabetes Father   . Cancer Father        colon  . Other Mother 102       MVA  . Healthy Sister   . Healthy Brother   . Healthy Daughter   . Hypothyroidism Daughter   . Other Brother        killed in war  . Pneumonia Sister   . Healthy Daughter   . Esophageal cancer Neg Hx   . Kidney disease Neg Hx   . Liver disease Neg Hx     Allergies  Allergen Reactions  . Amlodipine Rash  . Prochlorperazine Edisylate Anaphylaxis    Compazine--- tongue swells and rash  . Aspirin Other (See Comments)    nose bleeds. Cannot take NSAIDS   . Cymbalta [Duloxetine Hcl] Diarrhea, Nausea And Vomiting and Other (See Comments)    Increased blood pressure  . Pamelor [Nortriptyline Hcl] Diarrhea and Nausea Only    Increased Heart rate and BP    Medication list has been reviewed and updated.  Current Outpatient Medications on File Prior to Visit  Medication Sig Dispense Refill  . Abatacept (ORENCIA IV) Inject into the vein every 30 (thirty) days.    . Acetaminophen (TYLENOL EXTRA STRENGTH PO) Take 1-2 tablets by mouth every 6 (six) hours as needed (pain).     . Biotin 1000 MCG tablet Take 1,000 mcg by mouth daily.      . Calcium Carbonate-Vitamin D (CALCIUM 500 + D) 500-125 MG-UNIT TABS Take 1 tablet by mouth daily.     . carvedilol (COREG) 3.125 MG tablet TAKE 1 TABLET (3.125 MG TOTAL) BY MOUTH 2 (TWO) TIMES DAILY. 180 tablet 3  . denosumab (PROLIA) 60 MG/ML SOSY injection Inject 60 mg into the skin every 6 (six) months. Last injection 04-22-2019    . Diclofenac Sodium 3 % GEL Apply  a pea sized amount to affected joint twice daily as needed. (Patient taking differently: Apply 1 application topically 2 (two) times daily as needed (pain). Apply a pea sized amount to affected joint twice daily as needed.) 100 g 2  . docusate sodium (STOOL SOFTENER) 100 MG capsule Take 100 mg by mouth 2 (two) times daily.      Marland Kitchen gabapentin (NEURONTIN) 300 MG capsule TAKE 1 CAPSULE BY MOUTH IN THE MORNING, 1 IN THE AFTERNOON, AND 2 AT BEDTIME 360 capsule 1  . Glucosamine-Chondroit-Vit C-Mn (GLUCOSAMINE CHONDR 1500 COMPLX PO) Take 1 capsule by mouth daily.     Marland Kitchen levothyroxine (SYNTHROID) 75 MCG tablet Take 1 tablet (75 mcg total) by mouth daily before breakfast. 90 tablet 1  . methocarbamol (ROBAXIN) 500 MG tablet TAKE 1 TABLET (500 MG TOTAL) BY MOUTH EVERY 6 (SIX) HOURS AS NEEDED FOR MUSCLE SPASMS. 40 tablet 0  . Multiple Vitamin (MULTIVITAMIN) tablet Take 1 tablet by mouth daily.      Marland Kitchen omeprazole (PRILOSEC) 20 MG capsule Take 1 capsule (20 mg total) by mouth daily. 180 capsule 1  . Probiotic Product (PROBIOTIC PO) Take 1 capsule by mouth daily.      No current facility-administered medications on file prior to visit.    Review of Systems:  As per HPI- otherwise negative.   Physical Examination: Vitals:   09/07/19 1037  BP: 132/70  Pulse: 75  Resp: 16  SpO2: 98%   Vitals:   09/07/19 1037  Weight: 104 lb 9.6 oz (47.4 kg)  Height: 5' (1.524 m)   Body mass index is 20.43 kg/m. Ideal Body Weight: Weight in (lb) to have BMI = 25: 127.7  GEN: no acute distress.  Petite build, looks well HEENT: Atraumatic,  Normocephalic.   Bilateral TM wnl, oropharynx normal.  PEERL,EOMI.   Ears and Nose: No external deformity. CV: RRR, No M/G/R. No JVD. No thrill. No extra heart sounds. PULM: CTA B, no wheezes, crackles, rhonchi. No retractions. No resp. distress. No accessory muscle use. EXTR: No c/c/e PSYCH: Normally interactive. Conversant.  Scaly skin lesion left anterior shoulder Likely AK on left anterior chest wall  Pt consented for ear lavage, and also for LN cryotherapy to skin lesion on her chest.   Removed very large cerumen plugs from both ears- pt felt better, TM wnl after treatment  Also treated skin lesion on left chest x3 with LN No complications or problems with procedures    Assessment and Plan: Acquired hypothyroidism  Essential hypertension  Rheumatoid arthritis involving multiple joints (HCC)  Acute blood loss anemia  Bilateral impacted cerumen  Keratosis, actinic  Following up today-  BP under good control Her RA is being followed by rheumatology Resolved impacted cerumen bilaterally Treated AK with LN- asked her to let me know if this does not resolve in the next 2-3 weeks.  In that case can refer to dermatology  This visit occurred during the SARS-CoV-2 public health emergency.  Safety protocols were in place, including screening questions prior to the visit, additional usage of staff PPE, and extensive cleaning of exam room while observing appropriate contact time as indicated for disinfecting solutions.    Signed Lamar Blinks, MD

## 2019-09-05 NOTE — Patient Instructions (Addendum)
It was great to see you again today, as always!  Your BP looks great today BP Readings from Last 3 Encounters:  09/07/19 132/70  08/31/19 (!) 142/67  08/17/19 137/69   We froze the skin lesion on your left shoulder today- I think it will dry up and peel off over the next 2-3 weeks but let me know if this does not occur We removed a lot of wax from your ears today- let me know if you continue to have any issues with ear irritation or congestion

## 2019-09-07 ENCOUNTER — Ambulatory Visit: Payer: Medicare PPO | Admitting: Family Medicine

## 2019-09-07 ENCOUNTER — Encounter: Payer: Self-pay | Admitting: Family Medicine

## 2019-09-07 ENCOUNTER — Other Ambulatory Visit: Payer: Self-pay

## 2019-09-07 VITALS — BP 132/70 | HR 75 | Resp 16 | Ht 60.0 in | Wt 104.6 lb

## 2019-09-07 DIAGNOSIS — M069 Rheumatoid arthritis, unspecified: Secondary | ICD-10-CM

## 2019-09-07 DIAGNOSIS — D62 Acute posthemorrhagic anemia: Secondary | ICD-10-CM

## 2019-09-07 DIAGNOSIS — I10 Essential (primary) hypertension: Secondary | ICD-10-CM

## 2019-09-07 DIAGNOSIS — E039 Hypothyroidism, unspecified: Secondary | ICD-10-CM

## 2019-09-07 DIAGNOSIS — L570 Actinic keratosis: Secondary | ICD-10-CM

## 2019-09-07 DIAGNOSIS — H6123 Impacted cerumen, bilateral: Secondary | ICD-10-CM | POA: Diagnosis not present

## 2019-09-08 ENCOUNTER — Telehealth: Payer: Self-pay | Admitting: Pharmacist

## 2019-09-08 NOTE — Telephone Encounter (Signed)
Patient was to return to office in June for follow-up after starting Orencia.  Please schedule appointment in the next 3 weeks.   Mariella Saa, PharmD, [place], CPP Clinical Specialty Pharmacist (Rheumatology and Pulmonology)  09/08/2019 11:25 AM

## 2019-09-08 NOTE — Telephone Encounter (Signed)
I LMOM for patient to call, and schedule follow up appt maybe on 09/28/2019.

## 2019-09-12 NOTE — Progress Notes (Signed)
 Office Visit Note  Patient: [name]             Date of Birth: 09/07/1930           MRN: [id number]             PCP: Copland, Jessica C, MD Referring: Copland, Jessica C, MD Visit Date: 09/22/2019 Occupation: @GUAROCC@  Subjective:  Left shoulder joint pain   History of Present Illness: Martyna L Wieting is a 86 y.o. female with history of seronegative rheumatoid arthritis and osteoporosis.  She is currently on Orencia IV infusions every 30 days, which was started in May 2021. She was previously taking Arava but was having recurrent flares.  She has history of distant disc disease of lumbar spine.  She states she has been having some radiculopathy to her left hip.  She has been taking gabapentin which has been helpful.  She has noticed improvement on IV Orencia.  Still have some pain and swelling in her hands.  She receives Prolia 60 mg subcu injections every 6 months for management of osteoporosis.  Her last Prolia injection was on 04/22/2019.  Activities of Daily Living:  Patient reports morning stiffness for 2  hours.   Patient Reports nocturnal pain.  Difficulty dressing/grooming: Reports Difficulty climbing stairs: Reports Difficulty getting out of chair: Reports Difficulty using hands for taps, buttons, cutlery, and/or writing: Reports  Review of Systems  Constitutional: Negative for fatigue.  HENT: Negative for mouth sores, mouth dryness and nose dryness.   Eyes: Positive for redness. Negative for dryness.  Respiratory: Negative for shortness of breath and difficulty breathing.   Cardiovascular: Negative for chest pain and palpitations.  Gastrointestinal: Negative for blood in stool, constipation and diarrhea.  Endocrine: Negative for increased urination.  Genitourinary: Negative for difficulty urinating.  Musculoskeletal: Positive for arthralgias, joint pain, myalgias, morning stiffness, muscle tenderness and myalgias. Negative for joint swelling.  Skin: Negative  for color change and rash.  Allergic/Immunologic: Negative for susceptible to infections.  Neurological: Negative for dizziness, numbness, headaches, memory loss and weakness.  Hematological: Positive for bruising/bleeding tendency.  Psychiatric/Behavioral: Negative for confusion.    PMFS History:  Patient Active Problem List   Diagnosis Date Noted  . Acute posthemorrhagic anemia   . Rectal bleed 06/17/2019  . Grade II internal hemorrhoids   . Rectal ulcer   . Popliteal artery occlusion, 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/10/2018  . HTN (hypertension) 04/10/2018  . Femoral artery pseudo-aneurysm, 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/09/2018  . Anemia of chronic disease 02/24/2018  . Unstable right hip arthroplasty 01/24/2018  . History of revision of total replacement of right hip joint 01/24/2018  . Hypovolemic shock (HCC)   . Hyperkalemia   . Hyponatremia   . 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  . Status post revision of total hip 11/27/2017  . Elevated cholesterol 10/11/2015  . 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HCC) 10/04/2014  . Bilateral leg edema 08/01/2014  . Elevated BP 08/01/2014  . Rheumatoid arthritis involving multiple joints (HCC) 05/30/2014  . Status post total replacement of right hip 04/28/2014  . Long-term use of high-risk medication 11/11/2013  . Symptomatic anemia 11/11/2013  . Neuropathic pain 07/07/2013  . Constipation due to pain medication 07/07/2013  . Protein-calorie malnutrition, severe (HCC) 06/08/2013  . Lung cancer, Right upper lobe 05/08/2013  . Sciatica of right side 08/13/2011  . Osteoporosis 03/07/2010  . PARESTHESIA 03/07/2010  . CT, CHEST, ABNORMAL 12/18/2008  . ABNORMAL ECHOCARDIOGRAM 12/14/2008  . SJOGREN'S SYNDROME 11/29/2008  . HYPOGLYCEMIA 06/29/2006  .   RAYNAUD'S DISEASE 06/29/2006    Past Medical History:  Diagnosis Date  . Arthralgia of multiple joints    followed by dr Gerilyn Nestle  . Arthritis   . Cardiomyopathy ([place])   . Chronic constipation   . Chronic  inflammatory arthritis    rhemotolgist-  dr a. Gerilyn Nestle [facility])  . Dry eyes    eye drops used   . GERD (gastroesophageal reflux disease)   . H/O discoid lupus erythematosus   . Hiatal hernia   . History of colon polyps   . Hypothyroidism   . Iron deficiency anemia   . LBBB (left bundle branch block) 2010  . Mitchell's disease (erythromelalgia) [facility])    neurologist-  dr patel  . Nocturia   . Non-small cell cancer of right lung [facility]) surgeon-- dr gerhardt/  oncologist-  dr Julien Nordmann--- per lov notes no recurrence/   11-18-2017 per pt denies any symptoms   dx 2015--  Stage IIA (T2b,N0,M0) , +EGFR  mutation in exon 21, non-small cell adenocarcinoma right upper lobe---  s/p  Right upper lobectomy , right middley wedge resection and node dissection---  no chemo or radiation therapy  . OA (osteoarthritis)    hands  . Osteoporosis   . PONV (postoperative nausea and vomiting)    likes phenergan  . Raynaud's phenomenon 1965  . Renal insufficiency   . Rheumatoid arthritis ([place])   . Sciatica   . Scoliosis   . Sjogren's syndrome ([place])     Family History  Problem Relation Age of Onset  . Coronary artery disease Father   . Colon cancer Father   . Diabetes Father   . Cancer Father        colon  . Other Mother 55       MVA  . Healthy Sister   . Healthy Brother   . Healthy Daughter   . Hypothyroidism Daughter   . Other Brother        killed in war  . Pneumonia Sister   . Healthy Daughter   . Esophageal cancer Neg Hx   . Kidney disease Neg Hx   . Liver disease Neg Hx    Past Surgical History:  Procedure Laterality Date  . ANTERIOR HIP REVISION Right 11/27/2017   Procedure: RIGHT HIP ACETABULAR REVISION;  Surgeon: [name], MD;  Location: [facility];  Service: Orthopedics;  Laterality: Right;  . ANTERIOR HIP REVISION Right 01/24/2018   Procedure: OPEN REDUCTION OF DISLOCATED ANTERIOR HIP WITH REVISION OF LINER AND HIP BALL;  Surgeon: [name], MD;   Location: [facility];  Service: Orthopedics;  Laterality: Right;  . APPENDECTOMY  1950s  . BIOPSY  04/14/2018   Procedure: BIOPSY;  Surgeon: Yetta Flock, MD;  Location: [place];  Service: Gastroenterology;;  . BIOPSY  04/16/2018   Procedure: BIOPSY;  Surgeon: Irving Copas., MD;  Location: [facility];  Service: Gastroenterology;;  . CARDIOVASCULAR STRESS TEST  12/2008    mild fixed basal to mid septal perfusion defect felt likely due to artifact from LBBB, no ischemia, EF 58%  . COLONOSCOPY    . COLONOSCOPY WITH PROPOFOL N/A 04/16/2018   Procedure: COLONOSCOPY WITH PROPOFOL;  Surgeon: Rush Landmark Telford Nab., MD;  Location: [place];  Service: Gastroenterology;  Laterality: N/A;  . ESOPHAGOGASTRODUODENOSCOPY (EGD) WITH PROPOFOL N/A 04/14/2018   Procedure: ESOPHAGOGASTRODUODENOSCOPY (EGD) WITH PROPOFOL;  Surgeon: Yetta Flock, MD;  Location: [place];  Service: Gastroenterology;  Laterality: N/A;  . FEMORAL-POPLITEAL BYPASS GRAFT Right 04/10/2018  Procedure: REPAIR RIGHT FEMORAL ARTERY PSEUDOANEURYSM, RETROPERITONEAL EXPOSURE OF ILIAC ARTERY, RIGHT POPLITEAL EMBOLECTOMY;  Surgeon: Dickson, Christopher S, MD;  Location: [facility];  Service: Vascular;  Laterality: Right;  . FLEXIBLE SIGMOIDOSCOPY N/A 06/17/2019   Procedure: FLEXIBLE SIGMOIDOSCOPY;  Surgeon: Cirigliano, Vito V, DO;  [facility]: [facility];  Service: Gastroenterology;  Laterality: N/A;  . HEMOSTASIS CLIP PLACEMENT  06/17/2019   Procedure: HEMOSTASIS CLIP PLACEMENT;  Surgeon: Cirigliano, Vito V, DO;  [facility];  Service: Gastroenterology;;  . LYMPH NODE DISSECTION Right 06/07/2013   Procedure: LYMPH NODE DISSECTION;  Surgeon: Edward B Gerhardt, MD;  Location: [facility];  Service: Thoracic;  Laterality: Right;  . PATCH ANGIOPLASTY Right 04/10/2018   Procedure: PATCH  ANGIOPLASTY OF RIGHT FEMORAL ARTERY USING BOVINE PATCH, PATCH ANGIOPLASTY OF RIGHT POPLITEAL ARTERY USING BOVINE PATCH;  Surgeon:  Dickson, Christopher S, MD;  Location: [facility];  Service: Vascular;  Laterality: Right;  . SCHLEROTHERAPY  06/17/2019   Procedure: SCHLEROTHERAPY OF VARICES;  Surgeon: Cirigliano, Vito V, DO;  [facility];  Service: Gastroenterology;;  . THORACIC SYMPATHETECTOMY  1965   "large incision from chest to up to shoulder, the nerves were tied together, for raynaud's  . THORACOTOMY  06/07/2013   Procedure: MINI/LIMITED THORACOTOMY; right middle lobe wedge resection;  Surgeon: Edward B Gerhardt, MD;  [facility];  Service: Thoracic;;  . TONSILLECTOMY  child  . TOTAL ABDOMINAL HYSTERECTOMY  1980's    W/ BSO  . TOTAL HIP ARTHROPLASTY Right 04/28/2014   Procedure: RIGHT TOTAL HIP ARTHROPLASTY ANTERIOR APPROACH;  Surgeon: Christopher Y Blackman, MD;  Location: [facility];  Service: Orthopedics;  Laterality: Right;  . TRANSTHORACIC ECHOCARDIOGRAM  12/11/2008   ef 45-50%, grade 1 diastolic dysfunction/  mild LAE/  mild AR and MR/  trivial TR  . VIDEO ASSISTED THORACOSCOPY (VATS)/WEDGE RESECTION Right 06/07/2013   Procedure: VIDEO ASSISTED THORACOSCOPY (VATS)/right upper lobectomy, On Q;  Surgeon: Edward B Gerhardt, MD;  [facility];  Service: Thoracic;  Laterality: Right;  . VIDEO BRONCHOSCOPY N/A 06/07/2013   Procedure: VIDEO BRONCHOSCOPY;  Surgeon: Edward B Gerhardt, MD;  [facility];  Service: Thoracic;  Laterality: N/A;  . VIDEO BRONCHOSCOPY WITH ENDOBRONCHIAL NAVIGATION N/A 05/04/2013   Procedure: VIDEO BRONCHOSCOPY WITH ENDOBRONCHIAL NAVIGATION;  Surgeon: Edward B Gerhardt, MD;  [facility];  Service: Thoracic;  Laterality: N/A;   Social History   Social History Narrative   Lives with husband, daughter and grandchild local.   Highest level of education:  masters in education admin and technology   Immunization History  Administered Date(s) Administered  . Fluad Quad(high Dose 65+) 11/03/2018  . Influenza Split 11/21/2011  . Influenza Whole 11/29/2007, 11/29/2008, 11/29/2009  .  Influenza, High Dose Seasonal PF 10/30/2012, 01/02/2015, 11/12/2017  . Influenza,inj,Quad PF,6+ Mos 11/22/2013, 11/14/2015  . Influenza-Unspecified 11/13/2016, 11/12/2017  . PFIZER SARS-COV-2 Vaccination 04/12/2019, 05/03/2019  . Pneumococcal Conjugate-13 05/22/2015  . Pneumococcal Polysaccharide-23 06/13/2013  . Tdap 08/17/2017  . Zoster 01/27/2014     Objective: Vital Signs: BP (!) 147/82 (BP Location: Right Arm, Patient Position: Sitting, Cuff Size: Normal)   Pulse 63   Resp 14   Ht 5' 0.5" (1.537 m)   Wt 105 lb (47.6 kg)   BMI 20.17 kg/m    Physical Exam Vitals and nursing note reviewed.  Constitutional:      Appearance: She is well-developed.  HENT:     Head: Normocephalic and atraumatic.  Eyes:     Conjunctiva/sclera: Conjunctivae normal.  Cardiovascular:       Rate and Rhythm: Normal rate and regular rhythm.     Heart sounds: Normal heart sounds.  Pulmonary:     Effort: Pulmonary effort is normal.     Breath sounds: Normal breath sounds.  Abdominal:     General: Bowel sounds are normal.     Palpations: Abdomen is soft.  Musculoskeletal:     Cervical back: Normal range of motion.  Lymphadenopathy:     Cervical: No cervical adenopathy.  Skin:    General: Skin is warm and dry.     Capillary Refill: Capillary refill takes less than 2 seconds.  Neurological:     Mental Status: She is alert and oriented to person, place, and time.  Psychiatric:        Behavior: Behavior normal.      Musculoskeletal Exam: She has limited range of motion of her cervical spine.  Shoulder joint abduction is limited to about 40 degrees bilaterally.  Elbow joints are in good range of motion.  She had no synovitis over her wrist joints.  She has synovial thickening over MCP joints.  PIP and DIP thickening was noted.  Mild synovitis was noted in the left third MCP joint.  She has severe osteoarthritis with DIP subluxation.  Hip joints and knee joints with good range of motion.  Some  osteoarthritic changes were noted in her feet with no synovitis.  CDAI Exam: CDAI Score: 5.2  Patient Global: 6 mm; Provider Global: 6 mm Swollen: 1 ; Tender: 4  Joint Exam 09/22/2019      Right  Left  Glenohumeral   Tender   Tender  MCP 3     Swollen Tender  Lumbar Spine   Tender        Investigation: No additional findings.  Imaging: No results found.  Recent Labs: Lab Results  Component Value Date   WBC 4.6 08/17/2019   HGB 11.4 (L) 08/17/2019   PLT 179 08/17/2019   NA 135 08/17/2019   K 4.9 08/17/2019   CL 103 08/17/2019   CO2 25 08/17/2019   GLUCOSE 99 08/17/2019   BUN 26 (H) 08/17/2019   CREATININE 1.02 (H) 08/17/2019   BILITOT 0.5 08/17/2019   ALKPHOS 33 (L) 08/17/2019   AST 29 08/17/2019   ALT 17 08/17/2019   PROT 6.6 08/17/2019   ALBUMIN 3.9 08/17/2019   CALCIUM 9.0 08/17/2019   GFRAA 58 (L) 08/17/2019   QFTBGOLDPLUS NEGATIVE 06/28/2019    Speciality Comments: No specialty comments available.  Procedures:  No procedures performed Allergies: Amlodipine, Prochlorperazine edisylate, Aspirin, Cymbalta [duloxetine hcl], and Pamelor [nortriptyline hcl]   Assessment / Plan:     Visit Diagnoses: Rheumatoid arthritis of multiple sites with negative rheumatoid factor (HCC) - RF negative, anti-CCP negative: Patient is clinically doing much better on Orencia infusions.  This is her first visit after starting Orencia infusions.  She has mild synovitis in her left third MCP joint.  She has lot of previous damage from rheumatoid arthritis and has limited range of motion.  High risk medication use - Orencia IV now (previously on Arava-intolerance).  Her labs are stable.  She has no GFR.  TB gold was Jun 28, 2019.  Hair loss-improved  Raynaud's disease without gangrene-currently not symptomatic.  Sicca syndrome (HCC)-she continues to have dry mouth and dry eyes.  Over-the-counter products were discussed.  Age-related osteoporosis without current pathological  fracture - DEXA 01/28/2019: The BMD measured at Femur Neck is 0.636 g/cm2 with a T-score of -2.9.  Her last Prolia   injection was in April 22, 2019. not good candidate for fosamax or reclast (GERD, CKD)  Height loss - significant height loss likely due to thoracolumbar scoliosis.  She has no known vertebral fractures  Failure of right total hip arthroplasty, subsequent encounter  Status post total replacement of right hip  Patient received her COVID-19 vaccination in February.  She is advised to continue to wear her mask, practice hand hygiene and social distancing.  In case she develops COVID-19 infection she will need Covid antibody infusion.  She is aware of it now.  Instructions were given to adjust Orencia dose when she gets her booster COVID-19 vaccine.  Anemia of chronic disease-her anemia has been stable.  Essential hypertension-her blood pressure was elevated today.  Have advised her to monitor blood pressure closely.  Elevated cholesterol  Other idiopathic scoliosis, thoracolumbar region-she continues to have some lower back pain.  She has been using gabapentin which helps.  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HCC)  Malignant neoplasm of upper lobe of right lung (HCC)  Orders: No orders of the defined types were placed in this encounter.  No orders of the defined types were placed in this encounter.    Follow-Up Instructions: Return in about 3 months (around 12/23/2019) for Rheumatoid arthritis, Osteoporosis.   Shaili Deveshwar, MD  Note - This record has been created using Dragon software.  Chart creation errors have been sought, but may not always  have been located. Such creation errors do not reflect on  the standard of medical care. 

## 2019-09-13 ENCOUNTER — Encounter: Payer: Self-pay | Admitting: Family Medicine

## 2019-09-22 ENCOUNTER — Other Ambulatory Visit: Payer: Self-pay | Admitting: Pharmacist

## 2019-09-22 ENCOUNTER — Encounter: Payer: Self-pay | Admitting: Rheumatology

## 2019-09-22 ENCOUNTER — Other Ambulatory Visit: Payer: Self-pay

## 2019-09-22 ENCOUNTER — Ambulatory Visit: Payer: Medicare PPO | Admitting: Rheumatology

## 2019-09-22 VITALS — BP 147/82 | HR 63 | Resp 14 | Ht 60.5 in | Wt 105.0 lb

## 2019-09-22 DIAGNOSIS — M0609 Rheumatoid arthritis without rheumatoid factor, multiple sites: Secondary | ICD-10-CM

## 2019-09-22 DIAGNOSIS — Z79899 Other long term (current) drug therapy: Secondary | ICD-10-CM

## 2019-09-22 DIAGNOSIS — T84010D Broken internal right hip prosthesis, subsequent encounter: Secondary | ICD-10-CM | POA: Diagnosis not present

## 2019-09-22 DIAGNOSIS — M4125 Other idiopathic scoliosis, thoracolumbar region: Secondary | ICD-10-CM

## 2019-09-22 DIAGNOSIS — E270 Other adrenocortical overactivity: Secondary | ICD-10-CM

## 2019-09-22 DIAGNOSIS — M810 Age-related osteoporosis without current pathological fracture: Secondary | ICD-10-CM | POA: Diagnosis not present

## 2019-09-22 DIAGNOSIS — L659 Nonscarring hair loss, unspecified: Secondary | ICD-10-CM

## 2019-09-22 DIAGNOSIS — Z96641 Presence of right artificial hip joint: Secondary | ICD-10-CM | POA: Diagnosis not present

## 2019-09-22 DIAGNOSIS — M3500 Sicca syndrome, unspecified: Secondary | ICD-10-CM | POA: Diagnosis not present

## 2019-09-22 DIAGNOSIS — I7300 Raynaud's syndrome without gangrene: Secondary | ICD-10-CM | POA: Diagnosis not present

## 2019-09-22 DIAGNOSIS — E7800 Pure hypercholesterolemia, unspecified: Secondary | ICD-10-CM

## 2019-09-22 DIAGNOSIS — R29890 Loss of height: Secondary | ICD-10-CM | POA: Diagnosis not present

## 2019-09-22 DIAGNOSIS — I10 Essential (primary) hypertension: Secondary | ICD-10-CM

## 2019-09-22 DIAGNOSIS — C3411 Malignant neoplasm of upper lobe, right bronchus or lung: Secondary | ICD-10-CM

## 2019-09-22 DIAGNOSIS — D638 Anemia in other chronic diseases classified elsewhere: Secondary | ICD-10-CM

## 2019-09-22 NOTE — Patient Instructions (Addendum)
   COVID-19 vaccine recommendations:   COVID-19 vaccine is recommended for everyone (unless you are allergic to a vaccine component), even if you are on a medication that suppresses your immune system.   If you are on Methotrexate, Cellcept (mycophenolate), Rinvoq, Morrie Sheldon, and Olumiant- hold the medication for 1 week after each vaccine. Hold Methotrexate for 2 weeks after the single dose COVID-19 vaccine.   If you are on Orencia subcutaneous injection - hold medication one week prior to and one week after the first COVID-19 vaccine dose (only).   If you are on Orencia IV infusions- time vaccination administration so that the first COVID-19 vaccination will occur four weeks after the infusion and postpone the subsequent infusion by one week.   If you are on Cyclophosphamide or Rituxan infusions please contact your doctor prior to receiving the COVID-19 vaccine.   Do not take Tylenol or ant anti-inflammatory medications (NSAIDs) 24 hours prior to the COVID-19 vaccination.   There is no direct evidence about the efficacy of the COVID-19 vaccine in individuals who are on medications that suppress the immune system.   Even if you are fully vaccinated, and you are on any medications that suppress your immune system, please continue to wear a mask, maintain at least six feet social distance and practice hand hygiene.   If you develop a COVID-19 infection, please contact your PCP or our office to determine if you need antibody infusion.  We anticipate that a booster vaccine will be available soon for immunosuppressed individuals. Please cal our office before receiving your booster dose to make adjustments to your medication regimen.

## 2019-09-22 NOTE — Progress Notes (Signed)
Patient seen in office today.  She has noticed clinical improvement since starting Orencia IV.  She is to continue Orencia and is due for new orders.  Orders placed for Orencia IV 500 mg every 28 days x 3 doses along with premedication of Tylenol and Bendadryl. Standing orders placed for CBC/CMP every 2 months x 5.  Due for CBC/CMP with next infusion and TB gold in May 2022.   Mariella Saa, PharmD, [place], CPP Clinical Specialty Pharmacist (Rheumatology and Pulmonology)  09/22/2019 11:09 AM

## 2019-09-28 ENCOUNTER — Other Ambulatory Visit: Payer: Self-pay

## 2019-09-28 ENCOUNTER — Ambulatory Visit (HOSPITAL_COMMUNITY)
Admission: RE | Admit: 2019-09-28 | Discharge: 2019-09-28 | Disposition: A | Discharge status: Home / Self Care | Payer: Medicare PPO | Source: Ambulatory Visit | Attending: Rheumatology | Admitting: Rheumatology

## 2019-09-28 DIAGNOSIS — M0609 Rheumatoid arthritis without rheumatoid factor, multiple sites: Secondary | ICD-10-CM | POA: Diagnosis not present

## 2019-09-28 DIAGNOSIS — Z79899 Other long term (current) drug therapy: Secondary | ICD-10-CM

## 2019-09-28 LAB — CBC WITH DIFFERENTIAL/PLATELET
Abs Immature Granulocytes: 0.01 10*3/uL (ref 0.00–0.07)
Basophils Absolute: 0 10*3/uL (ref 0.0–0.1)
Basophils Relative: 1 %
Eosinophils Absolute: 0 10*3/uL (ref 0.0–0.5)
Eosinophils Relative: 1 %
HCT: 34.6 % — ABNORMAL LOW (ref 36.0–46.0)
Hemoglobin: 10.9 g/dL — ABNORMAL LOW (ref 12.0–15.0)
Immature Granulocytes: 0 %
Lymphocytes Relative: 27 %
Lymphs Abs: 1.1 10*3/uL (ref 0.7–4.0)
MCH: 29.4 pg (ref 26.0–34.0)
MCHC: 31.5 g/dL (ref 30.0–36.0)
MCV: 93.3 fL (ref 80.0–100.0)
Monocytes Absolute: 0.4 10*3/uL (ref 0.1–1.0)
Monocytes Relative: 10 %
Neutro Abs: 2.4 10*3/uL (ref 1.7–7.7)
Neutrophils Relative %: 61 %
Platelets: 181 10*3/uL (ref 150–400)
RBC: 3.71 MIL/uL — ABNORMAL LOW (ref 3.87–5.11)
RDW: 13.2 % (ref 11.5–15.5)
WBC: 3.9 10*3/uL — ABNORMAL LOW (ref 4.0–10.5)
nRBC: 0 % (ref 0.0–0.2)

## 2019-09-28 LAB — COMPREHENSIVE METABOLIC PANEL
ALT: 13 U/L (ref 0–44)
AST: 27 U/L (ref 15–41)
Albumin: 3.8 g/dL (ref 3.5–5.0)
Alkaline Phosphatase: 30 U/L — ABNORMAL LOW (ref 38–126)
Anion gap: 9 (ref 5–15)
BUN: 33 mg/dL — ABNORMAL HIGH (ref 8–23)
CO2: 24 mmol/L (ref 22–32)
Calcium: 9.5 mg/dL (ref 8.9–10.3)
Chloride: 102 mmol/L (ref 98–111)
Creatinine, Ser: 1.13 mg/dL — ABNORMAL HIGH (ref 0.44–1.00)
GFR calc Af Amer: 51 mL/min — ABNORMAL LOW (ref >60)
GFR calc non Af Amer: 44 mL/min — ABNORMAL LOW (ref >60)
Glucose, Bld: 94 mg/dL (ref 70–99)
Potassium: 5.1 mmol/L (ref 3.5–5.1)
Sodium: 135 mmol/L (ref 135–145)
Total Bilirubin: 0.6 mg/dL (ref 0.3–1.2)
Total Protein: 6.5 g/dL (ref 6.5–8.1)

## 2019-09-28 MED ORDER — DIPHENHYDRAMINE HCL 25 MG PO CAPS
ORAL_CAPSULE | ORAL | Status: AC
  Administered 2019-09-28: 25 mg
  Filled 2019-09-28: qty 1

## 2019-09-28 MED ORDER — DIPHENHYDRAMINE HCL 25 MG PO CAPS: 25.0000 mg | ORAL_CAPSULE | Freq: Once | ORAL | Status: DC

## 2019-09-28 MED ORDER — SODIUM CHLORIDE 0.9 % IV SOLN
500.0000 mg | INTRAVENOUS | Status: DC
  Administered 2019-09-28: 500 mg via INTRAVENOUS
  Filled 2019-09-28: qty 20

## 2019-09-28 MED ORDER — ACETAMINOPHEN 325 MG PO TABS: 650.0000 mg | ORAL_TABLET | Freq: Once | ORAL | Status: DC

## 2019-09-29 NOTE — Progress Notes (Signed)
Serum creatinine trending up.  WBC slightly low.  Hemoglobin and hematocrit trending down.  Patient has completed Orencia IV loading and 1 maintenance dose.

## 2019-09-30 NOTE — Progress Notes (Signed)
Please advise patient not to use Voltaren gel and avoid any oral NSAIDs.  Advise increase water intake.  Repeat CBC and CMP in 1 month.  If white cell count is still low then we may want to space Orencia every 6 weeks instead of every 4 weeks as she was clinically doing much better.

## 2019-10-09 ENCOUNTER — Encounter: Payer: Self-pay | Admitting: Rheumatology

## 2019-10-09 ENCOUNTER — Encounter: Payer: Self-pay | Admitting: Family Medicine

## 2019-10-10 ENCOUNTER — Other Ambulatory Visit: Payer: Self-pay | Admitting: Pharmacist

## 2019-10-10 DIAGNOSIS — M810 Age-related osteoporosis without current pathological fracture: Secondary | ICD-10-CM

## 2019-10-10 NOTE — Progress Notes (Signed)
Patient is due for Prolia on or after 9/5.  CBC/CMP stable on 09/28/19 and within normal limits to proceed with Prolia.  Order for Prolia placed.  Mariella Saa, PharmD, [place], CPP Clinical Specialty Pharmacist (Rheumatology and Pulmonology)  10/10/2019 8:41 AM

## 2019-10-19 ENCOUNTER — Ambulatory Visit: Payer: Medicare PPO | Admitting: Cardiology

## 2019-10-24 ENCOUNTER — Encounter: Payer: Self-pay | Admitting: Internal Medicine

## 2019-10-26 ENCOUNTER — Other Ambulatory Visit: Payer: Self-pay

## 2019-10-26 ENCOUNTER — Ambulatory Visit (HOSPITAL_COMMUNITY)
Admission: RE | Admit: 2019-10-26 | Discharge: 2019-10-26 | Disposition: A | Discharge status: Home / Self Care | Payer: Medicare PPO | Source: Ambulatory Visit | Attending: Rheumatology | Admitting: Rheumatology

## 2019-10-26 DIAGNOSIS — M0609 Rheumatoid arthritis without rheumatoid factor, multiple sites: Secondary | ICD-10-CM | POA: Diagnosis not present

## 2019-10-26 MED ORDER — SODIUM CHLORIDE 0.9 % IV SOLN
500.0000 mg | INTRAVENOUS | Status: DC
  Administered 2019-10-26: 500 mg via INTRAVENOUS
  Filled 2019-10-26: qty 20

## 2019-11-09 ENCOUNTER — Ambulatory Visit (HOSPITAL_COMMUNITY)
Admission: RE | Admit: 2019-11-09 | Discharge: 2019-11-09 | Disposition: A | Discharge status: Home / Self Care | Payer: Medicare PPO | Source: Ambulatory Visit | Attending: Rheumatology | Admitting: Rheumatology

## 2019-11-09 ENCOUNTER — Other Ambulatory Visit: Payer: Self-pay

## 2019-11-09 DIAGNOSIS — M810 Age-related osteoporosis without current pathological fracture: Secondary | ICD-10-CM | POA: Diagnosis not present

## 2019-11-09 MED ORDER — DENOSUMAB 60 MG/ML ~~LOC~~ SOSY
60.0000 mg | PREFILLED_SYRINGE | Freq: Once | SUBCUTANEOUS | Status: AC
  Administered 2019-11-09: 60 mg via SUBCUTANEOUS

## 2019-11-09 MED ORDER — DENOSUMAB 60 MG/ML ~~LOC~~ SOSY
PREFILLED_SYRINGE | SUBCUTANEOUS | Status: AC
  Filled 2019-11-09: qty 1

## 2019-11-16 ENCOUNTER — Encounter: Payer: Self-pay | Admitting: Family Medicine

## 2019-11-17 ENCOUNTER — Other Ambulatory Visit: Payer: Self-pay | Admitting: Family Medicine

## 2019-11-17 ENCOUNTER — Telehealth: Payer: Self-pay | Admitting: Family Medicine

## 2019-11-17 MED ORDER — LEVOTHYROXINE SODIUM 75 MCG PO TABS: ORAL_TABLET | ORAL | 1 refills | Status: DC

## 2019-11-17 NOTE — Telephone Encounter (Signed)
Sent to [facility]

## 2019-11-17 NOTE — Telephone Encounter (Signed)
Patient would like her synthroid to be send to  [id number] [place], [place] Phone:  [phone]  Fax:  [phone]     Patient states she does not want to deal with [facility].

## 2019-11-22 ENCOUNTER — Other Ambulatory Visit: Payer: Self-pay | Admitting: Orthopaedic Surgery

## 2019-11-22 NOTE — Telephone Encounter (Signed)
Please advise 

## 2019-11-23 ENCOUNTER — Ambulatory Visit (HOSPITAL_COMMUNITY)
Admission: RE | Admit: 2019-11-23 | Discharge: 2019-11-23 | Disposition: A | Discharge status: Home / Self Care | Payer: Medicare PPO | Source: Ambulatory Visit | Attending: Rheumatology | Admitting: Rheumatology

## 2019-11-23 ENCOUNTER — Telehealth: Payer: Self-pay | Admitting: *Deleted

## 2019-11-23 ENCOUNTER — Other Ambulatory Visit: Payer: Self-pay

## 2019-11-23 DIAGNOSIS — M0609 Rheumatoid arthritis without rheumatoid factor, multiple sites: Secondary | ICD-10-CM | POA: Diagnosis not present

## 2019-11-23 DIAGNOSIS — Z79899 Other long term (current) drug therapy: Secondary | ICD-10-CM

## 2019-11-23 LAB — COMPREHENSIVE METABOLIC PANEL
ALT: 13 U/L (ref 0–44)
AST: 25 U/L (ref 15–41)
Albumin: 4 g/dL (ref 3.5–5.0)
Alkaline Phosphatase: 32 U/L — ABNORMAL LOW (ref 38–126)
Anion gap: 10 (ref 5–15)
BUN: 24 mg/dL — ABNORMAL HIGH (ref 8–23)
CO2: 25 mmol/L (ref 22–32)
Calcium: 9.1 mg/dL (ref 8.9–10.3)
Chloride: 99 mmol/L (ref 98–111)
Creatinine, Ser: 1.05 mg/dL — ABNORMAL HIGH (ref 0.44–1.00)
GFR calc non Af Amer: 48 mL/min — ABNORMAL LOW (ref >60)
Glucose, Bld: 87 mg/dL (ref 70–99)
Potassium: 4.7 mmol/L (ref 3.5–5.1)
Sodium: 134 mmol/L — ABNORMAL LOW (ref 135–145)
Total Bilirubin: 0.5 mg/dL (ref 0.3–1.2)
Total Protein: 7 g/dL (ref 6.5–8.1)

## 2019-11-23 LAB — CBC WITH DIFFERENTIAL/PLATELET
Abs Immature Granulocytes: 0.01 10*3/uL (ref 0.00–0.07)
Basophils Absolute: 0 10*3/uL (ref 0.0–0.1)
Basophils Relative: 1 %
Eosinophils Absolute: 0 10*3/uL (ref 0.0–0.5)
Eosinophils Relative: 1 %
HCT: 35.7 % — ABNORMAL LOW (ref 36.0–46.0)
Hemoglobin: 11.6 g/dL — ABNORMAL LOW (ref 12.0–15.0)
Immature Granulocytes: 0 %
Lymphocytes Relative: 25 %
Lymphs Abs: 0.9 10*3/uL (ref 0.7–4.0)
MCH: 30.2 pg (ref 26.0–34.0)
MCHC: 32.5 g/dL (ref 30.0–36.0)
MCV: 93 fL (ref 80.0–100.0)
Monocytes Absolute: 0.5 10*3/uL (ref 0.1–1.0)
Monocytes Relative: 12 %
Neutro Abs: 2.2 10*3/uL (ref 1.7–7.7)
Neutrophils Relative %: 61 %
Platelets: 188 10*3/uL (ref 150–400)
RBC: 3.84 MIL/uL — ABNORMAL LOW (ref 3.87–5.11)
RDW: 13 % (ref 11.5–15.5)
WBC: 3.7 10*3/uL — ABNORMAL LOW (ref 4.0–10.5)
nRBC: 0 % (ref 0.0–0.2)

## 2019-11-23 MED ORDER — SODIUM CHLORIDE 0.9 % IV SOLN
500.0000 mg | INTRAVENOUS | Status: AC
  Administered 2019-11-23: 500 mg via INTRAVENOUS
  Filled 2019-11-23: qty 20

## 2019-11-23 NOTE — Telephone Encounter (Signed)
Patient had an Orencia infusion today. Patient will need new orders placed prior to her next infusion. Orencia IV 500 mg every 28 days

## 2019-11-23 NOTE — Progress Notes (Signed)
White cell count is low, GFR is low.  Anemia improved.  Labs are stable.

## 2019-11-24 ENCOUNTER — Telehealth: Payer: Self-pay | Admitting: *Deleted

## 2019-11-24 NOTE — Telephone Encounter (Signed)
Patient needs new Orencia infusion orders added. Thank you.

## 2019-11-25 ENCOUNTER — Other Ambulatory Visit: Payer: Self-pay | Admitting: Physician Assistant

## 2019-11-25 NOTE — Telephone Encounter (Signed)
Order for IV orencia x3 doses placed today on 11/25/19.

## 2019-11-25 NOTE — Progress Notes (Signed)
Next infusion is not scheduled yet but she is due for updated orders.  Last Visit: 09/22/19 Next Visit: 12/27/19 Labs: CBC and CMP were drawn on 11/23/19.  TB Gold:  TB gold negative 06/28/19.  Orders placed for Orencia 500 mg IV infusions x 3 doses along with premedication of Tylenol and Benadryl.  Standing CBC/CMP orders placed.  Hazel Sams, PA-C

## 2019-12-02 NOTE — Progress Notes (Signed)
HPI: FU CHF. Nuclear study 2010 showed ejection fraction 58% with fixed septal defect related to left bundle branch block and no ischemia. Echocardiogram January 2020 showed ejection fraction 40 to 76%, grade 1 diastolic dysfunction, mild aortic insufficiency and mild to moderate mitral regurgitation. Echocardiogram February 2020 showed normal LV systolic function grade 1 diastolic dysfunction, mild left atrial enlargement, mild aortic insufficiency.  Monitor April 2021 showed occasional PVC and brief episode of SVT. Since  Last seen, she denies increased dyspnea, chest pain, palpitations or syncope.  Current Outpatient Medications  Medication Sig Dispense Refill  . Abatacept (ORENCIA IV) Inject into the vein every 30 (thirty) days.    . Acetaminophen (TYLENOL EXTRA STRENGTH PO) Take 1-2 tablets by mouth every 6 (six) hours as needed (pain).     . Biotin 1000 MCG tablet Take 1,000 mcg by mouth daily.     . Calcium Carbonate-Vitamin D (CALCIUM 500 + D) 500-125 MG-UNIT TABS Take 1 tablet by mouth daily.     . carvedilol (COREG) 3.125 MG tablet TAKE 1 TABLET (3.125 MG TOTAL) BY MOUTH 2 (TWO) TIMES DAILY. 180 tablet 3  . denosumab (PROLIA) 60 MG/ML SOSY injection Inject 60 mg into the skin every 6 (six) months. Last injection 04-22-2019    . Diclofenac Sodium 3 % GEL Apply a pea sized amount to affected joint twice daily as needed. (Patient taking differently: Apply 1 application topically 2 (two) times daily as needed (pain). Apply a pea sized amount to affected joint twice daily as needed.) 100 g 2  . docusate sodium (STOOL SOFTENER) 100 MG capsule Take 100 mg by mouth 2 (two) times daily.      Marland Kitchen gabapentin (NEURONTIN) 300 MG capsule TAKE 1 CAPSULE BY MOUTH IN THE MORNING, 1 IN THE AFTERNOON, AND 2 AT BEDTIME 360 capsule 1  . Glucosamine-Chondroit-Vit C-Mn (GLUCOSAMINE CHONDR 1500 COMPLX PO) Take 1 capsule by mouth daily.     Marland Kitchen levothyroxine (EUTHYROX) 75 MCG tablet TAKE 1 TABLET BY MOUTH ONCE  DAILY BEFORE BREAKFAST 90 tablet 1  . methocarbamol (ROBAXIN) 500 MG tablet TAKE 1 TABLET (500 MG TOTAL) BY MOUTH EVERY 6 (SIX) HOURS AS NEEDED FOR MUSCLE SPASMS. 40 tablet 0  . Multiple Vitamin (MULTIVITAMIN) tablet Take 1 tablet by mouth daily.      Marland Kitchen omeprazole (PRILOSEC) 20 MG capsule Take 1 capsule (20 mg total) by mouth daily. 180 capsule 1  . Probiotic Product (PROBIOTIC PO) Take 1 capsule by mouth daily.      No current facility-administered medications for this visit.     Past Medical History:  Diagnosis Date  . Arthralgia of multiple joints    followed by dr Gerilyn Nestle  . Arthritis   . Cardiomyopathy ([place])   . Chronic constipation   . Chronic inflammatory arthritis    rhemotolgist-  dr a. Gerilyn Nestle [facility] [place])  . Dry eyes    eye drops used   . GERD (gastroesophageal reflux disease)   . H/O discoid lupus erythematosus   . Hiatal hernia   . History of colon polyps   . Hypothyroidism   . Iron deficiency anemia   . LBBB (left bundle branch block) 2010  . Mitchell's disease (erythromelalgia) [facility])    neurologist-  dr patel  . Nocturia   . Non-small cell cancer of right lung [facility]) surgeon-- dr gerhardt/  oncologist-  dr Julien Nordmann--- per lov notes no recurrence/   11-18-2017 per pt denies any symptoms   dx 2015--  Stage IIA (T2b,N0,M0) , +EGFR  mutation in exon 21, non-small cell adenocarcinoma right upper lobe---  s/p  Right upper lobectomy , right middley wedge resection and node dissection---  no chemo or radiation therapy  . OA (osteoarthritis)    hands  . Osteoporosis   . PONV (postoperative nausea and vomiting)    likes phenergan  . Raynaud's phenomenon 1965  . Renal insufficiency   . Rheumatoid arthritis ([place])   . Sciatica   . Scoliosis   . Sjogren's syndrome [facility])     Past Surgical History:  Procedure Laterality Date  . ANTERIOR HIP REVISION Right 11/27/2017   Procedure: RIGHT HIP ACETABULAR REVISION;  Surgeon: [name], MD;  Location: [facility]  ORS;  Service: Orthopedics;  Laterality: Right;  . ANTERIOR HIP REVISION Right 01/24/2018   Procedure: OPEN REDUCTION OF DISLOCATED ANTERIOR HIP WITH REVISION OF LINER AND HIP BALL;  Surgeon: [name], MD;  Location: [facility] ORS;  Service: Orthopedics;  Laterality: Right;  . APPENDECTOMY  1950s  . BIOPSY  04/14/2018   Procedure: BIOPSY;  Surgeon: Yetta Flock, MD;  Location: [place];  Service: Gastroenterology;;  . BIOPSY  04/16/2018   Procedure: BIOPSY;  Surgeon: Irving Copas., MD;  Location: [facility] ENDOSCOPY;  Service: Gastroenterology;;  . CARDIOVASCULAR STRESS TEST  12/2008    mild fixed basal to mid septal perfusion defect felt likely due to artifact from LBBB, no ischemia, EF 58%  . COLONOSCOPY    . COLONOSCOPY WITH PROPOFOL N/A 04/16/2018   Procedure: COLONOSCOPY WITH PROPOFOL;  Surgeon: Rush Landmark Telford Nab., MD;  Location: [place];  Service: Gastroenterology;  Laterality: N/A;  . ESOPHAGOGASTRODUODENOSCOPY (EGD) WITH PROPOFOL N/A 04/14/2018   Procedure: ESOPHAGOGASTRODUODENOSCOPY (EGD) WITH PROPOFOL;  Surgeon: Yetta Flock, MD;  Location: [place];  Service: Gastroenterology;  Laterality: N/A;  . FEMORAL-POPLITEAL BYPASS GRAFT Right 04/10/2018   Procedure: REPAIR RIGHT FEMORAL ARTERY PSEUDOANEURYSM, RETROPERITONEAL EXPOSURE OF ILIAC ARTERY, RIGHT POPLITEAL EMBOLECTOMY;  Surgeon: Angelia Mould, MD;  Location: [place];  Service: Vascular;  Laterality: Right;  . FLEXIBLE SIGMOIDOSCOPY N/A 06/17/2019   Procedure: FLEXIBLE SIGMOIDOSCOPY;  Surgeon: Lavena Bullion, DO;  [facility];  Service: Gastroenterology;  Laterality: N/A;  . HEMOSTASIS CLIP PLACEMENT  06/17/2019   Procedure: HEMOSTASIS CLIP PLACEMENT;  Surgeon: Lavena Bullion, DO;  [facility];  Service: Gastroenterology;;  . LYMPH NODE DISSECTION Right 06/07/2013   Procedure: LYMPH NODE DISSECTION;  Surgeon: [name], MD;  Location: [place];  Service:  Thoracic;  Laterality: Right;  . PATCH ANGIOPLASTY Right 04/10/2018   Procedure: PATCH  ANGIOPLASTY OF RIGHT FEMORAL ARTERY USING BOVINE PATCH, PATCH ANGIOPLASTY OF RIGHT POPLITEAL ARTERY USING BOVINE PATCH;  Surgeon: Angelia Mould, MD;  Location: [place];  Service: Vascular;  Laterality: Right;  . SCHLEROTHERAPY  06/17/2019   Procedure: SCHLEROTHERAPY OF VARICES;  Surgeon: Lavena Bullion, DO;  [facility];  Service: Gastroenterology;;  . [place]   "large incision from chest to up to shoulder, the nerves were tied together, for raynaud's  . THORACOTOMY  06/07/2013   Procedure: MINI/LIMITED THORACOTOMY; right middle lobe wedge resection;  Surgeon: [name], MD;  Location: [place];  Service: Thoracic;;  . TONSILLECTOMY  child  . TOTAL ABDOMINAL HYSTERECTOMY  1980's    W/ BSO  . TOTAL HIP ARTHROPLASTY Right 04/28/2014   Procedure: RIGHT TOTAL HIP ARTHROPLASTY ANTERIOR APPROACH;  Surgeon: [name], MD;  [facility];  Service: Orthopedics;  Laterality: Right;  . TRANSTHORACIC ECHOCARDIOGRAM  12/11/2008   ef 07-37%, grade 1 diastolic dysfunction/  mild LAE/  mild AR and MR/  trivial TR  . VIDEO ASSISTED THORACOSCOPY (VATS)/WEDGE RESECTION Right 06/07/2013   Procedure: VIDEO ASSISTED THORACOSCOPY (VATS)/right upper lobectomy, On Q;  Surgeon: [name], MD;  Location: [place];  Service: Thoracic;  Laterality: Right;  Marland Kitchen VIDEO BRONCHOSCOPY N/A 06/07/2013   Procedure: VIDEO BRONCHOSCOPY;  Surgeon: [name], MD;  Location: [facility] OR;  Service: Thoracic;  Laterality: N/A;  . VIDEO BRONCHOSCOPY WITH ENDOBRONCHIAL NAVIGATION N/A 05/04/2013   Procedure: VIDEO BRONCHOSCOPY WITH ENDOBRONCHIAL NAVIGATION;  Surgeon: [name], MD;  Location: [place];  Service: Thoracic;  Laterality: N/A;    Social History   Socioeconomic History  . Marital status: Widowed    Spouse name: Not on file  . Number of children: 2  . Years of  education: Not on file  . Highest education level: Not on file  Occupational History  . Occupation: n/a  Tobacco Use  . Smoking status: Never Smoker  . Smokeless tobacco: Never Used  Vaping Use  . Vaping Use: Never used  Substance and Sexual Activity  . Alcohol use: Not Currently  . Drug use: Never  . Sexual activity: Not Currently    Birth control/protection: Surgical  Other Topics Concern  . Not on file  Social History Narrative   Lives with husband, daughter and grandchild local.   Highest level of education:  masters in Materials engineer   Social Determinants of Health   Financial Resource Strain:   . Difficulty of Paying Living Expenses: Not on file  Food Insecurity:   . Worried About Charity fundraiser in the Last Year: Not on file  . Ran Out of Food in the Last Year: Not on file  Transportation Needs:   . Lack of Transportation (Medical): Not on file  . Lack of Transportation (Non-Medical): Not on file  Physical Activity:   . Days of Exercise per Week: Not on file  . Minutes of Exercise per Session: Not on file  Stress:   . Feeling of Stress : Not on file  Social Connections:   . Frequency of Communication with Friends and Family: Not on file  . Frequency of Social Gatherings with Friends and Family: Not on file  . Attends Religious Services: Not on file  . Active Member of Clubs or Organizations: Not on file  . Attends Archivist Meetings: Not on file  . Marital Status: Not on file  Intimate Partner Violence:   . Fear of Current or Ex-Partner: Not on file  . Emotionally Abused: Not on file  . Physically Abused: Not on file  . Sexually Abused: Not on file    Family History  Problem Relation Age of Onset  . Coronary artery disease Father   . Colon cancer Father   . Diabetes Father   . Cancer Father        colon  . Other Mother 58       MVA  . Healthy Sister   . Healthy Brother   . Healthy Daughter   . Hypothyroidism Daughter    . Other Brother        killed in war  . Pneumonia Sister   . Healthy Daughter   . Esophageal cancer Neg Hx   . Kidney disease Neg Hx   . Liver disease Neg Hx     ROS: no fevers  or chills, productive cough, hemoptysis, dysphasia, odynophagia, melena, hematochezia, dysuria, hematuria, rash, seizure activity, orthopnea, PND, pedal edema, claudication. Remaining systems are negative.  Physical Exam: Well-developed well-nourished in no acute distress.  Skin is warm and dry.  HEENT is normal.  Neck is supple.  Chest is clear to auscultation with normal expansion.  Cardiovascular exam is regular rate and rhythm.  Abdominal exam nontender or distended. No masses palpated. Extremities show no edema. neuro grossly intact  A/P  1 cardiomyopathy-LV function normal on most recent echocardiogram.  Continue beta-blocker at present dose.  2 palpitations-symptoms have improved compared to previous.  Continue beta-blocker at present dose.  3 left bundle branch block  Kirk Ruths, MD

## 2019-12-05 ENCOUNTER — Encounter: Payer: Self-pay | Admitting: Rheumatology

## 2019-12-07 ENCOUNTER — Other Ambulatory Visit: Payer: Self-pay

## 2019-12-07 ENCOUNTER — Ambulatory Visit: Payer: Medicare PPO | Admitting: Cardiology

## 2019-12-07 ENCOUNTER — Encounter: Payer: Self-pay | Admitting: Cardiology

## 2019-12-07 VITALS — BP 132/70 | HR 68 | Ht 61.0 in | Wt 106.0 lb

## 2019-12-07 DIAGNOSIS — R002 Palpitations: Secondary | ICD-10-CM | POA: Diagnosis not present

## 2019-12-07 DIAGNOSIS — I420 Dilated cardiomyopathy: Secondary | ICD-10-CM | POA: Diagnosis not present

## 2019-12-07 NOTE — Patient Instructions (Signed)

## 2019-12-11 NOTE — Progress Notes (Addendum)
[facility] [place], [place], [place] [address] [phone]  Date:  12/14/2019   Name:  [name]   DOB:  16-May-1930   MRN:  [id number]  PCP:  Darreld Mclean, MD    Chief Complaint: Follow-up (flu shot)   History of Present Illness:  [name] is a 86 y.o. 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 Stanford Breed last week-they are continuing her current beta-blocker dose for cardiomyopathy and palpitations  She is receiving Orencia infusions per Dr. Estanislado Pandy for her RA-most recent infusion October 6 She had CMP and CBC at that time Pt reports there was some concern about her CBC most recently- they are concerned that they may need to stop her Orencia due to?  Low white cell count  Flu vaccine- give today  COVID-19 booster- encouraged her to do at her convenience if she likes   Pt notes that she has felt weaker than normal the last several days-no particular or discrete symptoms, just feels a bit more tired and easily fatigued than is normal for her Her pain -shoulder arthritis, back pain- is getting worse  ES tylenol is not helping her as much right now -she wonders if I can prescribe something stronger for use as needed She has not been able to walk as far as she normally would  No CP or SOB No vomiting No fever or other sx of illness   Patient Active Problem List   Diagnosis Date Noted  . Acute posthemorrhagic anemia   . Rectal bleed 06/17/2019  . Grade II internal hemorrhoids   . Rectal ulcer   . Popliteal artery occlusion, right ([place]) 04/10/2018  . HTN  (hypertension) 04/10/2018  . Femoral artery pseudo-aneurysm, right ([place]) 04/09/2018  . Anemia of chronic disease 02/24/2018  . Unstable right hip arthroplasty 01/24/2018  . History of revision of total replacement of right hip joint 01/24/2018  . Hypovolemic shock ([place])   . Hyperkalemia   . Hyponatremia   . Failed total hip arthroplasty ([place]) 11/27/2017  . Status post revision of total hip 11/27/2017  . Elevated cholesterol 10/11/2015  . 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[place]) 10/04/2014  . Bilateral leg edema 08/01/2014  . Elevated BP 08/01/2014  . Rheumatoid arthritis involving multiple joints ([place]) 05/30/2014  . Status post total replacement of right hip 04/28/2014  . Long-term use of high-risk medication 11/11/2013  . Symptomatic anemia 11/11/2013  . Neuropathic pain 07/07/2013  . Constipation due to pain medication 07/07/2013  . Protein-calorie malnutrition, severe ([place]) 06/08/2013  . Lung cancer, Right upper lobe 05/08/2013  . Sciatica of right side 08/13/2011  . Osteoporosis 03/07/2010  . PARESTHESIA 03/07/2010  . CT, CHEST, ABNORMAL 12/18/2008  . ABNORMAL ECHOCARDIOGRAM 12/14/2008  . [place] SYNDROME 11/29/2008  . HYPOGLYCEMIA 06/29/2006  . RAYNAUD'S DISEASE 06/29/2006    Past Medical History:  Diagnosis Date  . Arthralgia of multiple joints    followed by dr Gerilyn Nestle  . Arthritis   . Cardiomyopathy ([place])   . Chronic constipation   . Chronic inflammatory arthritis    rhemotolgist-  dr a. Gerilyn Nestle [facility])  . Dry eyes    eye  drops used   . GERD (gastroesophageal reflux disease)   . H/O discoid lupus erythematosus   . Hiatal hernia   . History of colon polyps   . Hypothyroidism   . Iron deficiency anemia   . LBBB (left bundle branch block) 2010  . Mitchell's disease (erythromelalgia) [facility])    neurologist-  dr patel  . Nocturia   . Non-small cell cancer of right lung [facility]) surgeon-- dr gerhardt/  oncologist-  dr Julien Nordmann--- per lov notes no recurrence/    11-18-2017 per pt denies any symptoms   dx 2015--  Stage IIA (T2b,N0,M0) , +EGFR  mutation in exon 21, non-small cell adenocarcinoma right upper lobe---  s/p  Right upper lobectomy , right middley wedge resection and node dissection---  no chemo or radiation therapy  . OA (osteoarthritis)    hands  . Osteoporosis   . PONV (postoperative nausea and vomiting)    likes phenergan  . Raynaud's phenomenon 1965  . Renal insufficiency   . Rheumatoid arthritis ([place])   . Sciatica   . Scoliosis   . Sjogren's syndrome [facility])     Past Surgical History:  Procedure Laterality Date  . ANTERIOR HIP REVISION Right 11/27/2017   Procedure: RIGHT HIP ACETABULAR REVISION;  Surgeon: [name], MD;  Location: [facility];  Service: Orthopedics;  Laterality: Right;  . ANTERIOR HIP REVISION Right 01/24/2018   Procedure: OPEN REDUCTION OF DISLOCATED ANTERIOR HIP WITH REVISION OF LINER AND HIP BALL;  Surgeon: [name], MD;  Location: [facility];  Service: Orthopedics;  Laterality: Right;  . APPENDECTOMY  1950s  . BIOPSY  04/14/2018   Procedure: BIOPSY;  Surgeon: Yetta Flock, MD;  Location: [facility];  Service: Gastroenterology;;  . BIOPSY  04/16/2018   Procedure: BIOPSY;  Surgeon: Irving Copas., MD;  Location: [facility];  Service: Gastroenterology;;  . CARDIOVASCULAR STRESS TEST  12/2008    mild fixed basal to mid septal perfusion defect felt likely due to artifact from LBBB, no ischemia, EF 58%  . COLONOSCOPY    . COLONOSCOPY WITH PROPOFOL N/A 04/16/2018   Procedure: COLONOSCOPY WITH PROPOFOL;  Surgeon: Rush Landmark Telford Nab., MD;  Location: [facility];  Service: Gastroenterology;  Laterality: N/A;  . ESOPHAGOGASTRODUODENOSCOPY (EGD) WITH PROPOFOL N/A 04/14/2018   Procedure: ESOPHAGOGASTRODUODENOSCOPY (EGD) WITH PROPOFOL;  Surgeon: Yetta Flock, MD;  Location: [facility];  Service: Gastroenterology;  Laterality: N/A;  . FEMORAL-POPLITEAL BYPASS GRAFT Right  04/10/2018   Procedure: REPAIR RIGHT FEMORAL ARTERY PSEUDOANEURYSM, RETROPERITONEAL EXPOSURE OF ILIAC ARTERY, RIGHT POPLITEAL EMBOLECTOMY;  Surgeon: Angelia Mould, MD;  Location: [facility];  Service: Vascular;  Laterality: Right;  . FLEXIBLE SIGMOIDOSCOPY N/A 06/17/2019   Procedure: FLEXIBLE SIGMOIDOSCOPY;  Surgeon: Lavena Bullion, DO;  [facility];  Service: Gastroenterology;  Laterality: N/A;  . HEMOSTASIS CLIP PLACEMENT  06/17/2019   Procedure: HEMOSTASIS CLIP PLACEMENT;  Surgeon: Lavena Bullion, DO;  [facility];  Service: Gastroenterology;;  . LYMPH NODE DISSECTION Right 06/07/2013   Procedure: LYMPH NODE DISSECTION;  Surgeon: [name], MD;  Location: [facility];  Service: Thoracic;  Laterality: Right;  . PATCH ANGIOPLASTY Right 04/10/2018   Procedure: PATCH  ANGIOPLASTY OF RIGHT FEMORAL ARTERY USING BOVINE PATCH, PATCH ANGIOPLASTY OF RIGHT POPLITEAL ARTERY USING BOVINE PATCH;  Surgeon: Angelia Mould, MD;  Location: [facility];  Service: Vascular;  Laterality: Right;  . SCHLEROTHERAPY  06/17/2019   Procedure: SCHLEROTHERAPY OF VARICES;  Surgeon: Lavena Bullion, DO;  [facility];  Service: Gastroenterology;;  . [place]   "large incision from chest to up to shoulder, the nerves were tied together, for raynaud's  . THORACOTOMY  06/07/2013   Procedure: MINI/LIMITED THORACOTOMY; right middle lobe wedge resection;  Surgeon: [name], MD;  Location: [facility];  Service: Thoracic;;  . TONSILLECTOMY  child  . TOTAL ABDOMINAL HYSTERECTOMY  1980's    W/ BSO  . TOTAL HIP ARTHROPLASTY Right 04/28/2014   Procedure: RIGHT TOTAL HIP ARTHROPLASTY ANTERIOR APPROACH;  Surgeon: [name], MD;  [facility];  Service: Orthopedics;  Laterality: Right;  . TRANSTHORACIC ECHOCARDIOGRAM  12/11/2008   ef 16-10%, grade 1 diastolic dysfunction/  mild LAE/  mild AR and MR/  trivial TR  . VIDEO ASSISTED THORACOSCOPY  (VATS)/WEDGE RESECTION Right 06/07/2013   Procedure: VIDEO ASSISTED THORACOSCOPY (VATS)/right upper lobectomy, On Q;  Surgeon: [name], MD;  Location: [facility];  Service: Thoracic;  Laterality: Right;  [place] VIDEO BRONCHOSCOPY N/A 06/07/2013   Procedure: VIDEO BRONCHOSCOPY;  Surgeon: [name], MD;  Location[facility] OR;  Service: Thoracic;  Laterality: N/A;  . VIDEO BRONCHOSCOPY WITH ENDOBRONCHIAL NAVIGATION N/A 05/04/2013   Procedure: VIDEO BRONCHOSCOPY WITH ENDOBRONCHIAL NAVIGATION;  Surgeon: [name], MD;  Location: [facility];  Service: Thoracic;  Laterality: N/A;    Social History   Tobacco Use  . Smoking status: Never Smoker  . Smokeless tobacco: Never Used  Vaping Use  . Vaping Use: Never used  Substance Use Topics  . Alcohol use: Not Currently  . Drug use: Never    Family History  Problem Relation Age of Onset  . Coronary artery disease Father   . Colon cancer Father   . Diabetes Father   . Cancer Father        colon  . Other Mother 37       MVA  . Healthy Sister   . Healthy Brother   . Healthy Daughter   . Hypothyroidism Daughter   . Other Brother        killed in war  . Pneumonia Sister   . Healthy Daughter   . Esophageal cancer Neg Hx   . Kidney disease Neg Hx   . Liver disease Neg Hx     Allergies  Allergen Reactions  . Amlodipine Rash  . Prochlorperazine Edisylate Anaphylaxis    Compazine--- tongue swells and rash  . Aspirin Other (See Comments)    nose bleeds. Cannot take NSAIDS   . Cymbalta [Duloxetine Hcl] Diarrhea, Nausea And Vomiting and Other (See Comments)    Increased blood pressure  . Pamelor [Nortriptyline Hcl] Diarrhea and Nausea Only    Increased Heart rate and BP    Medication list has been reviewed and updated.  Current Outpatient Medications on File Prior to Visit  Medication Sig Dispense Refill  . Abatacept (ORENCIA IV) Inject into the vein every 30 (thirty) days.    . Acetaminophen (TYLENOL EXTRA STRENGTH PO) Take 1-2  tablets by mouth every 6 (six) hours as needed (pain).     . Biotin 1000 MCG tablet Take 1,000 mcg by mouth daily.     . Calcium Carbonate-Vitamin D (CALCIUM 500 + D) 500-125 MG-UNIT TABS Take 1 tablet by mouth daily.     . carvedilol (COREG) 3.125 MG tablet TAKE 1 TABLET (3.125 MG TOTAL) BY MOUTH 2 (TWO) TIMES DAILY. 180 tablet 3  . denosumab (PROLIA) 60 MG/ML SOSY injection Inject 60 mg into the skin every 6 (six) months.  Last injection 04-22-2019    . Diclofenac Sodium 3 % GEL Apply a pea sized amount to affected joint twice daily as needed. (Patient taking differently: Apply 1 application topically 2 (two) times daily as needed (pain). Apply a pea sized amount to affected joint twice daily as needed.) 100 g 2  . docusate sodium (STOOL SOFTENER) 100 MG capsule Take 100 mg by mouth 2 (two) times daily.      Marland Kitchen gabapentin (NEURONTIN) 300 MG capsule TAKE 1 CAPSULE BY MOUTH IN THE MORNING, 1 IN THE AFTERNOON, AND 2 AT BEDTIME 360 capsule 1  . Glucosamine-Chondroit-Vit C-Mn (GLUCOSAMINE CHONDR 1500 COMPLX PO) Take 1 capsule by mouth daily.     Marland Kitchen levothyroxine (EUTHYROX) 75 MCG tablet TAKE 1 TABLET BY MOUTH ONCE DAILY BEFORE BREAKFAST 90 tablet 1  . methocarbamol (ROBAXIN) 500 MG tablet TAKE 1 TABLET (500 MG TOTAL) BY MOUTH EVERY 6 (SIX) HOURS AS NEEDED FOR MUSCLE SPASMS. 40 tablet 0  . Multiple Vitamin (MULTIVITAMIN) tablet Take 1 tablet by mouth daily.      Marland Kitchen omeprazole (PRILOSEC) 20 MG capsule Take 1 capsule (20 mg total) by mouth daily. 180 capsule 1  . Probiotic Product (PROBIOTIC PO) Take 1 capsule by mouth daily.      No current facility-administered medications on file prior to visit.    Review of Systems:  As per HPI- otherwise negative.   Physical Examination: Vitals:   12/14/19 1058  BP: 138/70  Pulse: 70  Temp: (!) 96.7 F (35.9 C)  SpO2: 97%   Vitals:   12/14/19 1058  Weight: 106 lb 9.6 oz (48.4 kg)   Body mass index is 20.14 kg/m. Ideal Body Weight:    GEN: no acute  distress.  Petite build, looks well and her normal self HEENT: Atraumatic, Normocephalic.  Ears and Nose: No external deformity. CV: RRR, No M/G/R. No JVD. No thrill. No extra heart sounds. PULM: CTA B, no wheezes, crackles, rhonchi. No retractions. No resp. distress. No accessory muscle use. ABD: S, NT, ND, +BS. No rebound. No HSM. EXTR: No c/c/e Walks with rolling walker PSYCH: Normally interactive. Conversant.   Wt Readings from Last 3 Encounters:  12/14/19 106 lb 9.6 oz (48.4 kg)  12/07/19 106 lb (48.1 kg)  11/23/19 102 lb (46.3 kg)    Assessment and Plan: Fatigue, unspecified type - Plan: Urine Culture, CBC, TSH, CANCELED: CBC, CANCELED: TSH  Leukopenia, unspecified type - Plan: CBC, TSH, CANCELED: CBC, CANCELED: TSH  Arthralgia, unspecified joint - Plan: acetaminophen-codeine (TYLENOL #3) 300-30 MG tablet  Needs flu shot - Plan: Flu Vaccine QUAD High Dose(Fluad)   Elderly woman seen today for follow-up visit She has felt more fatigued than normal for the last several days.  No other specific symptoms.  We will check a TSH, urine culture, CBC which will also allow [place] to follow-up on recent leukopenia I have asked her to let me know if not feeling better in the next few days, sooner if worse We will have her try Tylenol 3 as needed for joint pains.  Advised her this can cause sedation, use with caution Will plan further follow- up pending labs.  This visit occurred during the SARS-CoV-2 public health emergency.  Safety protocols were in place, including screening questions prior to the visit, additional usage of staff PPE, and extensive cleaning of exam room while observing appropriate contact time as indicated for disinfecting solutions.    Signed Lamar Blinks, MD  Addendum 10/28, received her labs as below-message to  patient Mild anemia is stable, she underwent colonoscopy February 2020 which was okay Results for orders placed or performed in visit on 12/14/19  CBC   Result Value Ref Range   WBC 5.1 3.8 - 10.8 Thousand/uL   RBC 3.74 (L) 3.80 - 5.10 Million/uL   Hemoglobin 11.4 (L) 11.7 - 15.5 g/dL   HCT 33.9 (L) 35 - 45 %   MCV 90.6 80.0 - 100.0 fL   MCH 30.5 27.0 - 33.0 pg   MCHC 33.6 32.0 - 36.0 g/dL   RDW 11.9 11.0 - 15.0 %   Platelets 193 140 - 400 Thousand/uL   MPV 10.3 7.5 - 12.5 fL  TSH  Result Value Ref Range   TSH 0.99 0.40 - 4.50 mIU/L

## 2019-12-13 NOTE — Progress Notes (Deleted)
Office Visit Note  Patient: [name]             Date of Birth: [birth year]           MRN: [id number]             PCP: Darreld Mclean, MD Referring: Darreld Mclean, MD Visit Date: 12/27/2019 Occupation: @GUAROCC @  Subjective:  No chief complaint on file.   History of Present Illness: [name] is a 86 y.o. female ***   Activities of Daily Living:  Patient reports morning stiffness for *** {minute/hour:19697}.   Patient {ACTIONS;DENIES/REPORTS:21021675::"Denies"} nocturnal pain.  Difficulty dressing/grooming: {ACTIONS;DENIES/REPORTS:21021675::"Denies"} Difficulty climbing stairs: {ACTIONS;DENIES/REPORTS:21021675::"Denies"} Difficulty getting out of chair: {ACTIONS;DENIES/REPORTS:21021675::"Denies"} Difficulty using hands for taps, buttons, cutlery, and/or writing: {ACTIONS;DENIES/REPORTS:21021675::"Denies"}  No Rheumatology ROS completed.   PMFS History:  Patient Active Problem List   Diagnosis Date Noted  . Acute posthemorrhagic anemia   . Rectal bleed 06/17/2019  . Grade II internal hemorrhoids   . Rectal ulcer   . Popliteal artery occlusion, right ([place]) 04/10/2018  . HTN (hypertension) 04/10/2018  . Femoral artery pseudo-aneurysm, right ([place]) 04/09/2018  . Anemia of chronic disease 02/24/2018  . Unstable right hip arthroplasty 01/24/2018  . History of revision of total replacement of right hip joint 01/24/2018  . Hypovolemic shock ([place])   . Hyperkalemia   . Hyponatremia   . Failed total hip arthroplasty ([place]) 11/27/2017  . Status post revision of total hip 11/27/2017  . Elevated cholesterol 10/11/2015  . 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[place]) 10/04/2014  . Bilateral leg edema 08/01/2014  . Elevated BP 08/01/2014  . Rheumatoid arthritis involving multiple joints ([place]) 05/30/2014  . Status post total replacement of right hip 04/28/2014  . Long-term use of high-risk medication 11/11/2013  . Symptomatic anemia 11/11/2013  . Neuropathic pain  07/07/2013  . Constipation due to pain medication 07/07/2013  . Protein-calorie malnutrition, severe ([place]) 06/08/2013  . Lung cancer, Right upper lobe 05/08/2013  . Sciatica of right side 08/13/2011  . Osteoporosis 03/07/2010  . PARESTHESIA 03/07/2010  . CT, CHEST, ABNORMAL 12/18/2008  . ABNORMAL ECHOCARDIOGRAM 12/14/2008  . [place] SYNDROME 11/29/2008  . HYPOGLYCEMIA 06/29/2006  . RAYNAUD'S DISEASE 06/29/2006    Past Medical History:  Diagnosis Date  . Arthralgia of multiple joints    followed by dr Gerilyn Nestle  . Arthritis   . Cardiomyopathy ([place])   . Chronic constipation   . Chronic inflammatory arthritis    rhemotolgist-  dr a. Gerilyn Nestle [facility])  . Dry eyes    eye drops used   . GERD (gastroesophageal reflux disease)   . H/O discoid lupus erythematosus   . Hiatal hernia   . History of colon polyps   . Hypothyroidism   . Iron deficiency anemia   . LBBB (left bundle branch block) 2010  . Mitchell's disease (erythromelalgia) [facility])    neurologist-  dr patel  . Nocturia   . Non-small cell cancer of right lung [facility]) surgeon-- dr gerhardt/  oncologist-  dr Julien Nordmann--- per lov notes no recurrence/   11-18-2017 per pt denies any symptoms   dx 2015--  Stage IIA (T2b,N0,M0) , +EGFR  mutation in exon 21, non-small cell adenocarcinoma right upper lobe---  s/p  Right upper lobectomy , right middley wedge resection and node dissection---  no chemo or radiation therapy  . OA (osteoarthritis)    hands  . Osteoporosis   . PONV (postoperative nausea and vomiting)    likes phenergan  . Raynaud's phenomenon 1965  .  Renal insufficiency   . Rheumatoid arthritis ([place])   . Sciatica   . Scoliosis   . Sjogren's syndrome ([place])     Family History  Problem Relation Age of Onset  . Coronary artery disease Father   . Colon cancer Father   . Diabetes Father   . Cancer Father        colon  . Other Mother 41       MVA  . Healthy Sister   . Healthy Brother   . Healthy Daughter    . Hypothyroidism Daughter   . Other Brother        killed in war  . Pneumonia Sister   . Healthy Daughter   . Esophageal cancer Neg Hx   . Kidney disease Neg Hx   . Liver disease Neg Hx    Past Surgical History:  Procedure Laterality Date  . ANTERIOR HIP REVISION Right 11/27/2017   Procedure: RIGHT HIP ACETABULAR REVISION;  Surgeon: [name], MD;  [facility];  Service: Orthopedics;  Laterality: Right;  . ANTERIOR HIP REVISION Right 01/24/2018   Procedure: OPEN REDUCTION OF DISLOCATED ANTERIOR HIP WITH REVISION OF LINER AND HIP BALL;  Surgeon: [name], MD;  [facility];  Service: Orthopedics;  Laterality: Right;  . APPENDECTOMY  1950s  . BIOPSY  04/14/2018   Procedure: BIOPSY;  Surgeon: Yetta Flock, MD;  Location: [place];  Service: Gastroenterology;;  . BIOPSY  04/16/2018   Procedure: BIOPSY;  Surgeon: Irving Copas., MD;  Location: [facility] ENDOSCOPY;  Service: Gastroenterology;;  . CARDIOVASCULAR STRESS TEST  12/2008    mild fixed basal to mid septal perfusion defect felt likely due to artifact from LBBB, no ischemia, EF 58%  . COLONOSCOPY    . COLONOSCOPY WITH PROPOFOL N/A 04/16/2018   Procedure: COLONOSCOPY WITH PROPOFOL;  Surgeon: Rush Landmark Telford Nab., MD;  Location: [place];  Service: Gastroenterology;  Laterality: N/A;  . ESOPHAGOGASTRODUODENOSCOPY (EGD) WITH PROPOFOL N/A 04/14/2018   Procedure: ESOPHAGOGASTRODUODENOSCOPY (EGD) WITH PROPOFOL;  Surgeon: Yetta Flock, MD;  Location: [place];  Service: Gastroenterology;  Laterality: N/A;  . FEMORAL-POPLITEAL BYPASS GRAFT Right 04/10/2018   Procedure: REPAIR RIGHT FEMORAL ARTERY PSEUDOANEURYSM, RETROPERITONEAL EXPOSURE OF ILIAC ARTERY, RIGHT POPLITEAL EMBOLECTOMY;  Surgeon: Angelia Mould, MD;  Location: [place];  Service: Vascular;  Laterality: Right;  . FLEXIBLE SIGMOIDOSCOPY N/A 06/17/2019   Procedure: FLEXIBLE SIGMOIDOSCOPY;  Surgeon: Lavena Bullion, DO;  [facility];  Service: Gastroenterology;  Laterality: N/A;  . HEMOSTASIS CLIP PLACEMENT  06/17/2019   Procedure: HEMOSTASIS CLIP PLACEMENT;  Surgeon: Lavena Bullion, DO;  [facility];  Service: Gastroenterology;;  . LYMPH NODE DISSECTION Right 06/07/2013   Procedure: LYMPH NODE DISSECTION;  Surgeon: [name], MD;  Location: [place];  Service: Thoracic;  Laterality: Right;  . PATCH ANGIOPLASTY Right 04/10/2018   Procedure: PATCH  ANGIOPLASTY OF RIGHT FEMORAL ARTERY USING BOVINE PATCH, PATCH ANGIOPLASTY OF RIGHT POPLITEAL ARTERY USING BOVINE PATCH;  Surgeon: Angelia Mould, MD;  Location: [place];  Service: Vascular;  Laterality: Right;  . SCHLEROTHERAPY  06/17/2019   Procedure: SCHLEROTHERAPY OF VARICES;  Surgeon: Lavena Bullion, DO;  [facility];  Service: Gastroenterology;;  . [place]   "large incision from chest to up to shoulder, the nerves were tied together, for raynaud's  . THORACOTOMY  06/07/2013   Procedure: MINI/LIMITED THORACOTOMY; right middle lobe wedge resection;  Surgeon: [name], MD;  Location: [facility]  OR;  Service: Thoracic;;  . TONSILLECTOMY  child  . TOTAL ABDOMINAL HYSTERECTOMY  1980's    W/ BSO  . TOTAL HIP ARTHROPLASTY Right 04/28/2014   Procedure: RIGHT TOTAL HIP ARTHROPLASTY ANTERIOR APPROACH;  Surgeon: [name], MD;  [facility];  Service: Orthopedics;  Laterality: Right;  . TRANSTHORACIC ECHOCARDIOGRAM  12/11/2008   ef 82-99%, grade 1 diastolic dysfunction/  mild LAE/  mild AR and MR/  trivial TR  . VIDEO ASSISTED THORACOSCOPY (VATS)/WEDGE RESECTION Right 06/07/2013   Procedure: VIDEO ASSISTED THORACOSCOPY (VATS)/right upper lobectomy, On Q;  Surgeon: [name], MD;  Location: [place];  Service: Thoracic;  Laterality: Right;  [place] VIDEO BRONCHOSCOPY N/A 06/07/2013   Procedure: VIDEO BRONCHOSCOPY;  Surgeon: [name], MD;  [facility] OR;  Service:  Thoracic;  Laterality: N/A;  . VIDEO BRONCHOSCOPY WITH ENDOBRONCHIAL NAVIGATION N/A 05/04/2013   Procedure: VIDEO BRONCHOSCOPY WITH ENDOBRONCHIAL NAVIGATION;  Surgeon: [name], MD;  Location: [place];  Service: Thoracic;  Laterality: N/A;   Social History   Social History Narrative   Lives with husband, daughter and grandchild local.   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 Fluad Quad(high Dose 65+) 11/03/2018  . Influenza Split 11/21/2011  . Influenza Whole 11/29/2007, 11/29/2008, 11/29/2009  . Influenza, High Dose Seasonal PF 10/30/2012, 01/02/2015, 11/12/2017  . Influenza,inj,Quad PF,6+ Mos 11/22/2013, 11/14/2015  . Influenza-Unspecified 11/13/2016, 11/12/2017  . PFIZER SARS-COV-2 Vaccination 04/12/2019, 05/03/2019  . Pneumococcal Conjugate-13 05/22/2015  . Pneumococcal Polysaccharide-23 06/13/2013  . Tdap 08/17/2017  . Zoster 01/27/2014     Objective: Vital Signs: There were no vitals taken for this visit.   Physical Exam   Musculoskeletal Exam: ***  CDAI Exam: CDAI Score: -- Patient Global: --; Provider Global: -- Swollen: --; Tender: -- Joint Exam 12/27/2019   No joint exam has been documented for this visit   There is currently no information documented on the homunculus. Go to the Rheumatology activity and complete the homunculus joint exam.  Investigation: No additional findings.  Imaging: No results found.  Recent Labs: Lab Results  Component Value Date   WBC 3.7 (L) 11/23/2019   HGB 11.6 (L) 11/23/2019   PLT 188 11/23/2019   NA 134 (L) 11/23/2019   K 4.7 11/23/2019   CL 99 11/23/2019   CO2 25 11/23/2019   GLUCOSE 87 11/23/2019   BUN 24 (H) 11/23/2019   CREATININE 1.05 (H) 11/23/2019   BILITOT 0.5 11/23/2019   ALKPHOS 32 (L) 11/23/2019   AST 25 11/23/2019   ALT 13 11/23/2019   PROT 7.0 11/23/2019   ALBUMIN 4.0 11/23/2019   CALCIUM 9.1  11/23/2019   GFRAA 51 (L) 09/28/2019   QFTBGOLDPLUS NEGATIVE 06/28/2019    Speciality Comments: No specialty comments available.  Procedures:  No procedures performed Allergies: Amlodipine, Prochlorperazine edisylate, Aspirin, Cymbalta [duloxetine hcl], and Pamelor [nortriptyline hcl]   Assessment / Plan:     Visit Diagnoses: No diagnosis found.  Orders: No orders of the defined types were placed in this encounter.  No orders of the defined types were placed in this encounter.   Face-to-face time spent with patient was *** minutes. Greater than 50% of time was spent in counseling and coordination of care.  Follow-Up Instructions: No follow-ups on file.   Earnestine Mealing, CMA  Note - This record has been created using Editor, commissioning.  Chart creation errors have been sought, but may  not always  have been located. Such creation errors do not reflect on  the standard of medical care.

## 2019-12-14 ENCOUNTER — Encounter: Payer: Self-pay | Admitting: Family Medicine

## 2019-12-14 ENCOUNTER — Ambulatory Visit: Payer: Medicare PPO | Admitting: Family Medicine

## 2019-12-14 ENCOUNTER — Other Ambulatory Visit: Payer: Self-pay

## 2019-12-14 VITALS — BP 138/70 | HR 70 | Temp 96.7°F | Wt 106.6 lb

## 2019-12-14 DIAGNOSIS — D72819 Decreased white blood cell count, unspecified: Secondary | ICD-10-CM | POA: Diagnosis not present

## 2019-12-14 DIAGNOSIS — R5383 Other fatigue: Secondary | ICD-10-CM | POA: Diagnosis not present

## 2019-12-14 DIAGNOSIS — M2550 Pain in unspecified joint: Secondary | ICD-10-CM | POA: Diagnosis not present

## 2019-12-14 DIAGNOSIS — Z23 Encounter for immunization: Secondary | ICD-10-CM

## 2019-12-14 LAB — CBC
HCT: 33.9 % — ABNORMAL LOW (ref 35.0–45.0)
Hemoglobin: 11.4 g/dL — ABNORMAL LOW (ref 11.7–15.5)
MCH: 30.5 pg (ref 27.0–33.0)
MCHC: 33.6 g/dL (ref 32.0–36.0)
MCV: 90.6 fL (ref 80.0–100.0)
MPV: 10.3 fL (ref 7.5–12.5)
Platelets: 193 10*3/uL (ref 140–400)
RBC: 3.74 10*6/uL — ABNORMAL LOW (ref 3.80–5.10)
RDW: 11.9 % (ref 11.0–15.0)
WBC: 5.1 10*3/uL (ref 3.8–10.8)

## 2019-12-14 LAB — TSH: TSH: 0.99 mIU/L (ref 0.40–4.50)

## 2019-12-14 MED ORDER — ACETAMINOPHEN-CODEINE #3 300-30 MG PO TABS: 1.0000 | ORAL_TABLET | Freq: Three times a day (TID) | ORAL | 0 refills | Status: DC | PRN

## 2019-12-14 NOTE — Patient Instructions (Signed)
Good to see you again today!  Flu shot given I will be in touch with your labs asap- we will lok for any cause of your feeling weak.  Let me know if you do not get back to normal soon or if getting worse!   mammo due in December I send in an rx for tylenol with codeine for you to try for pain- remember this is a mild narcotic so it may cause drowsiness.  Do not use when you need to drive

## 2019-12-15 ENCOUNTER — Encounter: Payer: Self-pay | Admitting: Family Medicine

## 2019-12-15 LAB — URINE CULTURE
MICRO NUMBER:: 11125759
Result:: NO GROWTH
SPECIMEN QUALITY:: ADEQUATE

## 2019-12-21 ENCOUNTER — Other Ambulatory Visit (HOSPITAL_COMMUNITY): Payer: Self-pay | Admitting: *Deleted

## 2019-12-21 ENCOUNTER — Ambulatory Visit (HOSPITAL_COMMUNITY)
Admission: RE | Admit: 2019-12-21 | Discharge: 2019-12-21 | Disposition: A | Discharge status: Home / Self Care | Payer: Medicare PPO | Source: Ambulatory Visit | Attending: Rheumatology | Admitting: Rheumatology

## 2019-12-21 ENCOUNTER — Other Ambulatory Visit: Payer: Self-pay

## 2019-12-21 DIAGNOSIS — M0609 Rheumatoid arthritis without rheumatoid factor, multiple sites: Secondary | ICD-10-CM | POA: Insufficient documentation

## 2019-12-21 LAB — COMPREHENSIVE METABOLIC PANEL
ALT: 14 U/L (ref 0–44)
AST: 28 U/L (ref 15–41)
Albumin: 3.9 g/dL (ref 3.5–5.0)
Alkaline Phosphatase: 36 U/L — ABNORMAL LOW (ref 38–126)
Anion gap: 8 (ref 5–15)
BUN: 25 mg/dL — ABNORMAL HIGH (ref 8–23)
CO2: 28 mmol/L (ref 22–32)
Calcium: 9.2 mg/dL (ref 8.9–10.3)
Chloride: 95 mmol/L — ABNORMAL LOW (ref 98–111)
Creatinine, Ser: 1.11 mg/dL — ABNORMAL HIGH (ref 0.44–1.00)
GFR, Estimated: 48 mL/min — ABNORMAL LOW (ref >60)
Glucose, Bld: 107 mg/dL — ABNORMAL HIGH (ref 70–99)
Potassium: 4.6 mmol/L (ref 3.5–5.1)
Sodium: 131 mmol/L — ABNORMAL LOW (ref 135–145)
Total Bilirubin: 0.4 mg/dL (ref 0.3–1.2)
Total Protein: 6.5 g/dL (ref 6.5–8.1)

## 2019-12-21 LAB — CBC WITH DIFFERENTIAL/PLATELET
Abs Immature Granulocytes: 0.01 10*3/uL (ref 0.00–0.07)
Basophils Absolute: 0 10*3/uL (ref 0.0–0.1)
Basophils Relative: 1 %
Eosinophils Absolute: 0.1 10*3/uL (ref 0.0–0.5)
Eosinophils Relative: 1 %
HCT: 34.1 % — ABNORMAL LOW (ref 36.0–46.0)
Hemoglobin: 11.1 g/dL — ABNORMAL LOW (ref 12.0–15.0)
Immature Granulocytes: 0 %
Lymphocytes Relative: 24 %
Lymphs Abs: 1.1 10*3/uL (ref 0.7–4.0)
MCH: 30.1 pg (ref 26.0–34.0)
MCHC: 32.6 g/dL (ref 30.0–36.0)
MCV: 92.4 fL (ref 80.0–100.0)
Monocytes Absolute: 0.5 10*3/uL (ref 0.1–1.0)
Monocytes Relative: 12 %
Neutro Abs: 2.7 10*3/uL (ref 1.7–7.7)
Neutrophils Relative %: 62 %
Platelets: 192 10*3/uL (ref 150–400)
RBC: 3.69 MIL/uL — ABNORMAL LOW (ref 3.87–5.11)
RDW: 12.6 % (ref 11.5–15.5)
WBC: 4.4 10*3/uL (ref 4.0–10.5)
nRBC: 0 % (ref 0.0–0.2)

## 2019-12-21 MED ORDER — DIPHENHYDRAMINE HCL 25 MG PO CAPS: 25.0000 mg | ORAL_CAPSULE | Freq: Once | ORAL | Status: DC

## 2019-12-21 MED ORDER — ACETAMINOPHEN 325 MG PO TABS: 650.0000 mg | ORAL_TABLET | Freq: Once | ORAL | Status: DC

## 2019-12-21 MED ORDER — SODIUM CHLORIDE 0.9 % IV SOLN
500.0000 mg | INTRAVENOUS | Status: DC
  Administered 2019-12-21: 500 mg via INTRAVENOUS
  Filled 2019-12-21: qty 20

## 2019-12-21 NOTE — Progress Notes (Signed)
RBC count, hgb, and hct are low but trending down slightly.  Please notify the patient and forward lab work to PCP.

## 2019-12-21 NOTE — Progress Notes (Signed)
Called to room by patient at 1135 and she said she felt like she was having heart burn.  Pt took her pre meds at home prior to coming to short stay. VS slightly elevated from the start of the infusion.  I asked if she felt like she needed me to stop her infusion and she said no, she wanted to just try some water.  Continued infusion and water given.  Pt stated she felt better a few minutes later.

## 2019-12-23 ENCOUNTER — Encounter: Payer: Self-pay | Admitting: Family Medicine

## 2019-12-27 ENCOUNTER — Ambulatory Visit: Payer: Medicare PPO | Admitting: Physician Assistant

## 2019-12-27 DIAGNOSIS — D638 Anemia in other chronic diseases classified elsewhere: Secondary | ICD-10-CM

## 2019-12-27 DIAGNOSIS — Z96641 Presence of right artificial hip joint: Secondary | ICD-10-CM

## 2019-12-27 DIAGNOSIS — L659 Nonscarring hair loss, unspecified: Secondary | ICD-10-CM

## 2019-12-27 DIAGNOSIS — M0609 Rheumatoid arthritis without rheumatoid factor, multiple sites: Secondary | ICD-10-CM

## 2019-12-27 DIAGNOSIS — E7800 Pure hypercholesterolemia, unspecified: Secondary | ICD-10-CM

## 2019-12-27 DIAGNOSIS — M3500 Sicca syndrome, unspecified: Secondary | ICD-10-CM

## 2019-12-27 DIAGNOSIS — Z79899 Other long term (current) drug therapy: Secondary | ICD-10-CM

## 2019-12-27 DIAGNOSIS — I7300 Raynaud's syndrome without gangrene: Secondary | ICD-10-CM

## 2019-12-27 DIAGNOSIS — I10 Essential (primary) hypertension: Secondary | ICD-10-CM

## 2019-12-27 DIAGNOSIS — M4125 Other idiopathic scoliosis, thoracolumbar region: Secondary | ICD-10-CM

## 2019-12-27 DIAGNOSIS — E270 Other adrenocortical overactivity: Secondary | ICD-10-CM

## 2019-12-27 DIAGNOSIS — C3411 Malignant neoplasm of upper lobe, right bronchus or lung: Secondary | ICD-10-CM

## 2019-12-27 DIAGNOSIS — R29890 Loss of height: Secondary | ICD-10-CM

## 2019-12-27 DIAGNOSIS — T84010D Broken internal right hip prosthesis, subsequent encounter: Secondary | ICD-10-CM

## 2019-12-27 DIAGNOSIS — M810 Age-related osteoporosis without current pathological fracture: Secondary | ICD-10-CM

## 2019-12-31 ENCOUNTER — Other Ambulatory Visit: Payer: Self-pay | Admitting: Family Medicine

## 2020-01-18 ENCOUNTER — Other Ambulatory Visit: Payer: Self-pay

## 2020-01-18 ENCOUNTER — Encounter (HOSPITAL_COMMUNITY)
Admission: RE | Admit: 2020-01-18 | Discharge: 2020-01-18 | Disposition: A | Discharge status: Home / Self Care | Payer: Medicare PPO | Source: Ambulatory Visit | Attending: Rheumatology | Admitting: Rheumatology

## 2020-01-18 DIAGNOSIS — M0689 Other specified rheumatoid arthritis, multiple sites: Secondary | ICD-10-CM | POA: Insufficient documentation

## 2020-01-18 MED ORDER — ACETAMINOPHEN 325 MG PO TABS: 650.0000 mg | ORAL_TABLET | Freq: Once | ORAL | Status: DC

## 2020-01-18 MED ORDER — SODIUM CHLORIDE 0.9 % IV SOLN
500.0000 mg | INTRAVENOUS | Status: DC
  Administered 2020-01-18: 500 mg via INTRAVENOUS
  Filled 2020-01-18: qty 20

## 2020-01-18 MED ORDER — DIPHENHYDRAMINE HCL 25 MG PO CAPS: 25.0000 mg | ORAL_CAPSULE | Freq: Once | ORAL | Status: DC

## 2020-01-25 ENCOUNTER — Other Ambulatory Visit: Payer: Self-pay | Admitting: Family Medicine

## 2020-01-25 DIAGNOSIS — M2550 Pain in unspecified joint: Secondary | ICD-10-CM

## 2020-01-25 NOTE — Telephone Encounter (Signed)
Requesting: Tylenol #3 Contract: None UDS: None Last Visit: 12/14/2019 Next Visit: None scheduled Last Refill: 12/14/2019 #30 and 0RF Pt sig: 1-2 tab q8h prn  Please Advise

## 2020-01-26 ENCOUNTER — Ambulatory Visit: Payer: Self-pay | Admitting: *Deleted

## 2020-01-26 ENCOUNTER — Ambulatory Visit: Payer: Self-pay

## 2020-02-08 ENCOUNTER — Telehealth: Payer: Self-pay | Admitting: Pharmacist

## 2020-02-08 NOTE — Telephone Encounter (Signed)
Received notification from [facility] regarding a authorization for [facility] IV Infusions. Authorization has been APPROVED from 07/06/19 through 02/16/21.   Authorization # [id number] Phone # [phone]  Knox Saliva, PharmD, MPH Clinical Pharmacist (Rheumatology and Pulmonology)

## 2020-02-14 ENCOUNTER — Encounter: Payer: Self-pay | Admitting: Internal Medicine

## 2020-02-15 ENCOUNTER — Other Ambulatory Visit: Payer: Self-pay

## 2020-02-15 ENCOUNTER — Encounter (HOSPITAL_COMMUNITY)
Admission: RE | Admit: 2020-02-15 | Discharge: 2020-02-15 | Disposition: A | Discharge status: Home / Self Care | Payer: Medicare PPO | Source: Ambulatory Visit | Attending: Rheumatology | Admitting: Rheumatology

## 2020-02-15 DIAGNOSIS — M0689 Other specified rheumatoid arthritis, multiple sites: Secondary | ICD-10-CM | POA: Diagnosis not present

## 2020-02-15 LAB — CBC WITH DIFFERENTIAL/PLATELET
Abs Immature Granulocytes: 0.01 10*3/uL (ref 0.00–0.07)
Basophils Absolute: 0 10*3/uL (ref 0.0–0.1)
Basophils Relative: 1 %
Eosinophils Absolute: 0 10*3/uL (ref 0.0–0.5)
Eosinophils Relative: 1 %
HCT: 35.2 % — ABNORMAL LOW (ref 36.0–46.0)
Hemoglobin: 10.9 g/dL — ABNORMAL LOW (ref 12.0–15.0)
Immature Granulocytes: 0 %
Lymphocytes Relative: 28 %
Lymphs Abs: 1 10*3/uL (ref 0.7–4.0)
MCH: 29.5 pg (ref 26.0–34.0)
MCHC: 31 g/dL (ref 30.0–36.0)
MCV: 95.4 fL (ref 80.0–100.0)
Monocytes Absolute: 0.4 10*3/uL (ref 0.1–1.0)
Monocytes Relative: 10 %
Neutro Abs: 2.2 10*3/uL (ref 1.7–7.7)
Neutrophils Relative %: 60 %
Platelets: 177 10*3/uL (ref 150–400)
RBC: 3.69 MIL/uL — ABNORMAL LOW (ref 3.87–5.11)
RDW: 12.6 % (ref 11.5–15.5)
WBC: 3.7 10*3/uL — ABNORMAL LOW (ref 4.0–10.5)
nRBC: 0 % (ref 0.0–0.2)

## 2020-02-15 LAB — COMPREHENSIVE METABOLIC PANEL
ALT: 16 U/L (ref 0–44)
AST: 26 U/L (ref 15–41)
Albumin: 3.9 g/dL (ref 3.5–5.0)
Alkaline Phosphatase: 34 U/L — ABNORMAL LOW (ref 38–126)
Anion gap: 7 (ref 5–15)
BUN: 22 mg/dL (ref 8–23)
CO2: 26 mmol/L (ref 22–32)
Calcium: 8.9 mg/dL (ref 8.9–10.3)
Chloride: 101 mmol/L (ref 98–111)
Creatinine, Ser: 0.96 mg/dL (ref 0.44–1.00)
GFR, Estimated: 57 mL/min — ABNORMAL LOW (ref >60)
Glucose, Bld: 101 mg/dL — ABNORMAL HIGH (ref 70–99)
Potassium: 4.2 mmol/L (ref 3.5–5.1)
Sodium: 134 mmol/L — ABNORMAL LOW (ref 135–145)
Total Bilirubin: 0.2 mg/dL — ABNORMAL LOW (ref 0.3–1.2)
Total Protein: 6.8 g/dL (ref 6.5–8.1)

## 2020-02-15 MED ORDER — ACETAMINOPHEN 325 MG PO TABS: 650.0000 mg | ORAL_TABLET | Freq: Once | ORAL | Status: DC

## 2020-02-15 MED ORDER — DIPHENHYDRAMINE HCL 25 MG PO CAPS: 25.0000 mg | ORAL_CAPSULE | Freq: Once | ORAL | Status: DC

## 2020-02-15 MED ORDER — SODIUM CHLORIDE 0.9 % IV SOLN
500.0000 mg | INTRAVENOUS | Status: DC
  Administered 2020-02-15: 500 mg via INTRAVENOUS
  Filled 2020-02-15: qty 20

## 2020-02-17 ENCOUNTER — Encounter: Payer: Self-pay | Admitting: Family Medicine

## 2020-02-18 MED ORDER — CEPHALEXIN 500 MG PO CAPS: 500.0000 mg | ORAL_CAPSULE | Freq: Two times a day (BID) | ORAL | 0 refills | Status: AC

## 2020-02-18 NOTE — Progress Notes (Deleted)
Office Visit Note  Patient: [name]             Date of Birth: June 03, 1930           MRN: [id number]             PCP: Darreld Mclean, MD Referring: Darreld Mclean, MD Visit Date: 02/28/2020 Occupation: @GUAROCC @  Subjective:  No chief complaint on file.   History of Present Illness: [name] is a 87 y.o. female ***   Activities of Daily Living:  Patient reports morning stiffness for *** {minute/hour:19697}.   Patient {ACTIONS;DENIES/REPORTS:21021675::"Denies"} nocturnal pain.  Difficulty dressing/grooming: {ACTIONS;DENIES/REPORTS:21021675::"Denies"} Difficulty climbing stairs: {ACTIONS;DENIES/REPORTS:21021675::"Denies"} Difficulty getting out of chair: {ACTIONS;DENIES/REPORTS:21021675::"Denies"} Difficulty using hands for taps, buttons, cutlery, and/or writing: {ACTIONS;DENIES/REPORTS:21021675::"Denies"}  No Rheumatology ROS completed.   PMFS History:  Patient Active Problem List   Diagnosis Date Noted  . Acute posthemorrhagic anemia   . Rectal bleed 06/17/2019  . Grade II internal hemorrhoids   . Rectal ulcer   . Popliteal artery occlusion, right ([place]) 04/10/2018  . HTN (hypertension) 04/10/2018  . Femoral artery pseudo-aneurysm, right ([place]) 04/09/2018  . Anemia of chronic disease 02/24/2018  . Unstable right hip arthroplasty 01/24/2018  . History of revision of total replacement of right hip joint 01/24/2018  . Hypovolemic shock ([place])   . Hyperkalemia   . Hyponatremia   . Failed total hip arthroplasty ([place]) 11/27/2017  . Status post revision of total hip 11/27/2017  . Elevated cholesterol 10/11/2015  . 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[place]) 10/04/2014  . Bilateral leg edema 08/01/2014  . Elevated BP 08/01/2014  . Rheumatoid arthritis involving multiple joints ([place]) 05/30/2014  . Status post total replacement of right hip 04/28/2014  . Long-term use of high-risk medication 11/11/2013  . Symptomatic anemia 11/11/2013  . Neuropathic pain  07/07/2013  . Constipation due to pain medication 07/07/2013  . Protein-calorie malnutrition, severe ([place]) 06/08/2013  . Lung cancer, Right upper lobe 05/08/2013  . Sciatica of right side 08/13/2011  . Osteoporosis 03/07/2010  . PARESTHESIA 03/07/2010  . CT, CHEST, ABNORMAL 12/18/2008  . ABNORMAL ECHOCARDIOGRAM 12/14/2008  . [place] SYNDROME 11/29/2008  . HYPOGLYCEMIA 06/29/2006  . RAYNAUD'S DISEASE 06/29/2006    Past Medical History:  Diagnosis Date  . Arthralgia of multiple joints    followed by dr Gerilyn Nestle  . Arthritis   . Cardiomyopathy ([place])   . Chronic constipation   . Chronic inflammatory arthritis    rhemotolgist-  dr a. Gerilyn Nestle [facility])  . Dry eyes    eye drops used   . GERD (gastroesophageal reflux disease)   . H/O discoid lupus erythematosus   . Hiatal hernia   . History of colon polyps   . Hypothyroidism   . Iron deficiency anemia   . LBBB (left bundle branch block) 2010  . Mitchell's disease (erythromelalgia) [facility])    neurologist-  dr patel  . Nocturia   . Non-small cell cancer of right lung [facility]) surgeon-- dr gerhardt/  oncologist-  dr Julien Nordmann--- per lov notes no recurrence/   11-18-2017 per pt denies any symptoms   dx 2015--  Stage IIA (T2b,N0,M0) , +EGFR  mutation in exon 21, non-small cell adenocarcinoma right upper lobe---  s/p  Right upper lobectomy , right middley wedge resection and node dissection---  no chemo or radiation therapy  . OA (osteoarthritis)    hands  . Osteoporosis   . PONV (postoperative nausea and vomiting)    likes phenergan  . Raynaud's phenomenon 1965  .  Renal insufficiency   . Rheumatoid arthritis ([place])   . Sciatica   . Scoliosis   . Sjogren's syndrome ([place])     Family History  Problem Relation Age of Onset  . Coronary artery disease Father   . Colon cancer Father   . Diabetes Father   . Cancer Father        colon  . Other Mother 41       MVA  . Healthy Sister   . Healthy Brother   . Healthy Daughter    . Hypothyroidism Daughter   . Other Brother        killed in war  . Pneumonia Sister   . Healthy Daughter   . Esophageal cancer Neg Hx   . Kidney disease Neg Hx   . Liver disease Neg Hx    Past Surgical History:  Procedure Laterality Date  . ANTERIOR HIP REVISION Right 11/27/2017   Procedure: RIGHT HIP ACETABULAR REVISION;  Surgeon: [name], MD;  [facility];  Service: Orthopedics;  Laterality: Right;  . ANTERIOR HIP REVISION Right 01/24/2018   Procedure: OPEN REDUCTION OF DISLOCATED ANTERIOR HIP WITH REVISION OF LINER AND HIP BALL;  Surgeon: [name], MD;  [facility];  Service: Orthopedics;  Laterality: Right;  . APPENDECTOMY  1950s  . BIOPSY  04/14/2018   Procedure: BIOPSY;  Surgeon: Yetta Flock, MD;  Location: [place];  Service: Gastroenterology;;  . BIOPSY  04/16/2018   Procedure: BIOPSY;  Surgeon: Irving Copas., MD;  Location: [facility] ENDOSCOPY;  Service: Gastroenterology;;  . CARDIOVASCULAR STRESS TEST  12/2008    mild fixed basal to mid septal perfusion defect felt likely due to artifact from LBBB, no ischemia, EF 58%  . COLONOSCOPY    . COLONOSCOPY WITH PROPOFOL N/A 04/16/2018   Procedure: COLONOSCOPY WITH PROPOFOL;  Surgeon: Rush Landmark Telford Nab., MD;  Location: [place];  Service: Gastroenterology;  Laterality: N/A;  . ESOPHAGOGASTRODUODENOSCOPY (EGD) WITH PROPOFOL N/A 04/14/2018   Procedure: ESOPHAGOGASTRODUODENOSCOPY (EGD) WITH PROPOFOL;  Surgeon: Yetta Flock, MD;  Location: [place];  Service: Gastroenterology;  Laterality: N/A;  . FEMORAL-POPLITEAL BYPASS GRAFT Right 04/10/2018   Procedure: REPAIR RIGHT FEMORAL ARTERY PSEUDOANEURYSM, RETROPERITONEAL EXPOSURE OF ILIAC ARTERY, RIGHT POPLITEAL EMBOLECTOMY;  Surgeon: Angelia Mould, MD;  Location: [place];  Service: Vascular;  Laterality: Right;  . FLEXIBLE SIGMOIDOSCOPY N/A 06/17/2019   Procedure: FLEXIBLE SIGMOIDOSCOPY;  Surgeon: Lavena Bullion, DO;  [facility];  Service: Gastroenterology;  Laterality: N/A;  . HEMOSTASIS CLIP PLACEMENT  06/17/2019   Procedure: HEMOSTASIS CLIP PLACEMENT;  Surgeon: Lavena Bullion, DO;  [facility];  Service: Gastroenterology;;  . LYMPH NODE DISSECTION Right 06/07/2013   Procedure: LYMPH NODE DISSECTION;  Surgeon: [name], MD;  Location: [place];  Service: Thoracic;  Laterality: Right;  . PATCH ANGIOPLASTY Right 04/10/2018   Procedure: PATCH  ANGIOPLASTY OF RIGHT FEMORAL ARTERY USING BOVINE PATCH, PATCH ANGIOPLASTY OF RIGHT POPLITEAL ARTERY USING BOVINE PATCH;  Surgeon: Angelia Mould, MD;  Location: [place];  Service: Vascular;  Laterality: Right;  . SCHLEROTHERAPY  06/17/2019   Procedure: SCHLEROTHERAPY OF VARICES;  Surgeon: Lavena Bullion, DO;  [facility];  Service: Gastroenterology;;  . [place]   "large incision from chest to up to shoulder, the nerves were tied together, for raynaud's  . THORACOTOMY  06/07/2013   Procedure: MINI/LIMITED THORACOTOMY; right middle lobe wedge resection;  Surgeon: [name], MD;  Location: [facility]  OR;  Service: Thoracic;;  . TONSILLECTOMY  child  . TOTAL ABDOMINAL HYSTERECTOMY  1980's    W/ BSO  . TOTAL HIP ARTHROPLASTY Right 04/28/2014   Procedure: RIGHT TOTAL HIP ARTHROPLASTY ANTERIOR APPROACH;  Surgeon: Kathryne Hitch, MD;  [facility];  Service: Orthopedics;  Laterality: Right;  . TRANSTHORACIC ECHOCARDIOGRAM  12/11/2008   ef 45-50%, grade 1 diastolic dysfunction/  mild LAE/  mild AR and MR/  trivial TR  . VIDEO ASSISTED THORACOSCOPY (VATS)/WEDGE RESECTION Right 06/07/2013   Procedure: VIDEO ASSISTED THORACOSCOPY (VATS)/right upper lobectomy, On Q;  Surgeon: Delight Ovens, MD;  [facility] OR;  Service: Thoracic;  Laterality: Right;  [place] VIDEO BRONCHOSCOPY N/A 06/07/2013   Procedure: VIDEO BRONCHOSCOPY;  Surgeon: Delight Ovens, MD;  Location: [facility] OR;  Service:  Thoracic;  Laterality: N/A;  . VIDEO BRONCHOSCOPY WITH ENDOBRONCHIAL NAVIGATION N/A 05/04/2013   Procedure: VIDEO BRONCHOSCOPY WITH ENDOBRONCHIAL NAVIGATION;  Surgeon: Delight Ovens, MD;  Location: [facility] OR;  Service: Thoracic;  Laterality: N/A;   Social History   Social History Narrative   Lives with husband, daughter and grandchild local.   Highest level of education:  masters in education admin and Engineer, technical sales History  Administered Date(s) Administered  . Fluad Quad(high Dose 65+) 11/03/2018, 12/14/2019  . Influenza Split 11/21/2011  . Influenza Whole 11/29/2007, 11/29/2008, 11/29/2009  . Influenza, High Dose Seasonal PF 10/30/2012, 01/02/2015, 11/12/2017  . Influenza,inj,Quad PF,6+ Mos 11/22/2013, 11/14/2015  . Influenza-Unspecified 11/13/2016, 11/12/2017  . PFIZER SARS-COV-2 Vaccination 04/12/2019, 05/03/2019, 01/20/2020  . Pneumococcal Conjugate-13 05/22/2015  . Pneumococcal Polysaccharide-23 06/13/2013  . Tdap 08/17/2017  . Zoster 01/27/2014     Objective: Vital Signs: There were no vitals taken for this visit.   Physical Exam   Musculoskeletal Exam: ***  CDAI Exam: CDAI Score: -- Patient Global: --; Provider Global: -- Swollen: --; Tender: -- Joint Exam 02/28/2020   No joint exam has been documented for this visit   There is currently no information documented on the homunculus. Go to the Rheumatology activity and complete the homunculus joint exam.  Investigation: No additional findings.  Imaging: No results found.  Recent Labs: Lab Results  Component Value Date   WBC 3.7 (L) 02/15/2020   HGB 10.9 (L) 02/15/2020   PLT 177 02/15/2020   NA 134 (L) 02/15/2020   K 4.2 02/15/2020   CL 101 02/15/2020   CO2 26 02/15/2020   GLUCOSE 101 (H) 02/15/2020   BUN 22 02/15/2020   CREATININE 0.96 02/15/2020   BILITOT 0.2 (L) 02/15/2020   ALKPHOS 34 (L) 02/15/2020   AST 26 02/15/2020   ALT 16 02/15/2020   PROT 6.8 02/15/2020   ALBUMIN 3.9  02/15/2020   CALCIUM 8.9 02/15/2020   GFRAA 51 (L) 09/28/2019   QFTBGOLDPLUS NEGATIVE 06/28/2019    Speciality Comments: No specialty comments available.  Procedures:  No procedures performed Allergies: Amlodipine, Prochlorperazine edisylate, Aspirin, Cymbalta [duloxetine hcl], and Pamelor [nortriptyline hcl]   Assessment / Plan:     Visit Diagnoses: Rheumatoid arthritis of multiple sites with negative rheumatoid factor (HCC)  High risk medication use  Raynaud's disease without gangrene  Sicca syndrome (HCC)  Age-related osteoporosis without current pathological fracture  Height loss  Status post total replacement of right hip  Anemia of chronic disease  Essential hypertension  Elevated cholesterol  Other idiopathic scoliosis, thoracolumbar region  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HCC)  Malignant neoplasm of upper lobe of right lung (HCC)  Orders: No orders of the defined types  were placed in this encounter.  No orders of the defined types were placed in this encounter.   Face-to-face time spent with patient was *** minutes. Greater than 50% of time was spent in counseling and coordination of care.  Follow-Up Instructions: No follow-ups on file.   Bo Merino, MD  Note - This record has been created using Editor, commissioning.  Chart creation errors have been sought, but may not always  have been located. Such creation errors do not reflect on  the standard of medical care.

## 2020-02-18 NOTE — Telephone Encounter (Signed)
Called pt and spoke with her- she ended up deciding to stay home.  She is not sure if she is getting a UTI possibly.   rx sent in for keflex for one week to her pharmacy She will keep me posted about how she is feeling

## 2020-02-23 NOTE — Progress Notes (Deleted)
Office Visit Note  Patient: [name]             Date of Birth: 03/09/1930           MRN: [id number]             PCP: Darreld Mclean, MD Referring: Darreld Mclean, MD Visit Date: 03/05/2020 Occupation: _0 @  Subjective:  No chief complaint on file.   History of Present Illness: ZYASIA HALBLEIB is a 87 y.o. female ***   Activities of Daily Living:  Patient reports morning stiffness for *** {minute/hour:19697}.   Patient {ACTIONS;DENIES/REPORTS:21021675::"Denies"} nocturnal pain.  Difficulty dressing/grooming: {ACTIONS;DENIES/REPORTS:21021675::"Denies"} Difficulty climbing stairs: {ACTIONS;DENIES/REPORTS:21021675::"Denies"} Difficulty getting out of chair: {ACTIONS;DENIES/REPORTS:21021675::"Denies"} Difficulty using hands for taps, buttons, cutlery, and/or writing: {ACTIONS;DENIES/REPORTS:21021675::"Denies"}  No Rheumatology ROS completed.   PMFS History:  Patient Active Problem List   Diagnosis Date Noted  . Acute posthemorrhagic anemia   . Rectal bleed 06/17/2019  . Grade II internal hemorrhoids   . Rectal ulcer   . Popliteal artery occlusion, right ([place]) 04/10/2018  . HTN (hypertension) 04/10/2018  . Femoral artery pseudo-aneurysm, right ([place]) 04/09/2018  . Anemia of chronic disease 02/24/2018  . Unstable right hip arthroplasty 01/24/2018  . History of revision of total replacement of right hip joint 01/24/2018  . Hypovolemic shock ([place])   . Hyperkalemia   . Hyponatremia   . Failed total hip arthroplasty ([place]) 11/27/2017  . Status post revision of total hip 11/27/2017  . Elevated cholesterol 10/11/2015  . 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[place]) 10/04/2014  . Bilateral leg edema 08/01/2014  . Elevated BP 08/01/2014  . Rheumatoid arthritis involving multiple joints ([place]) 05/30/2014  . Status post total replacement of right hip 04/28/2014  . Long-term use of high-risk medication 11/11/2013  . Symptomatic anemia 11/11/2013  . Neuropathic pain  07/07/2013  . Constipation due to pain medication 07/07/2013  . Protein-calorie malnutrition, severe ([place]) 06/08/2013  . Lung cancer, Right upper lobe 05/08/2013  . Sciatica of right side 08/13/2011  . Osteoporosis 03/07/2010  . PARESTHESIA 03/07/2010  . CT, CHEST, ABNORMAL 12/18/2008  . ABNORMAL ECHOCARDIOGRAM 12/14/2008  . [place] SYNDROME 11/29/2008  . HYPOGLYCEMIA 06/29/2006  . RAYNAUD'S DISEASE 06/29/2006    Past Medical History:  Diagnosis Date  . Arthralgia of multiple joints    followed by dr Gerilyn Nestle  . Arthritis   . Cardiomyopathy ([place])   . Chronic constipation   . Chronic inflammatory arthritis    rhemotolgist-  dr a. Gerilyn Nestle [facility] [place] [facility])  . Dry eyes    eye drops used   . GERD (gastroesophageal reflux disease)   . H/O discoid lupus erythematosus   . Hiatal hernia   . History of colon polyps   . Hypothyroidism   . Iron deficiency anemia   . LBBB (left bundle branch block) 2010  . Mitchell's disease (erythromelalgia) [facility])    neurologist-  dr patel  . Nocturia   . Non-small cell cancer of right lung [facility]) surgeon-- dr gerhardt/  oncologist-  dr Julien Nordmann--- per lov notes no recurrence/   11-18-2017 per pt denies any symptoms   dx 2015--  Stage IIA (T2b,N0,M0) , +EGFR  mutation in exon 21, non-small cell adenocarcinoma right upper lobe---  s/p  Right upper lobectomy , right middley wedge resection and node dissection---  no chemo or radiation therapy  . OA (osteoarthritis)    hands  . Osteoporosis   . PONV (postoperative nausea and vomiting)    likes phenergan  . Raynaud's phenomenon 1965  .  Renal insufficiency   . Rheumatoid arthritis ([place])   . Sciatica   . Scoliosis   . Sjogren's syndrome ([place])     Family History  Problem Relation Age of Onset  . Coronary artery disease Father   . Colon cancer Father   . Diabetes Father   . Cancer Father        colon  . Other Mother 41       MVA  . Healthy Sister   . Healthy Brother   . Healthy Daughter    . Hypothyroidism Daughter   . Other Brother        killed in war  . Pneumonia Sister   . Healthy Daughter   . Esophageal cancer Neg Hx   . Kidney disease Neg Hx   . Liver disease Neg Hx    Past Surgical History:  Procedure Laterality Date  . ANTERIOR HIP REVISION Right 11/27/2017   Procedure: RIGHT HIP ACETABULAR REVISION;  Surgeon: [name], MD;  Location: [facility];  Service: Orthopedics;  Laterality: Right;  . ANTERIOR HIP REVISION Right 01/24/2018   Procedure: OPEN REDUCTION OF DISLOCATED ANTERIOR HIP WITH REVISION OF LINER AND HIP BALL;  Surgeon: [name], MD;  Location: [facility];  Service: Orthopedics;  Laterality: Right;  . APPENDECTOMY  1950s  . BIOPSY  04/14/2018   Procedure: BIOPSY;  Surgeon: Yetta Flock, MD;  Location: [place];  Service: Gastroenterology;;  . BIOPSY  04/16/2018   Procedure: BIOPSY;  Surgeon: Irving Copas., MD;  Location: [facility] ENDOSCOPY;  Service: Gastroenterology;;  . CARDIOVASCULAR STRESS TEST  12/2008    mild fixed basal to mid septal perfusion defect felt likely due to artifact from LBBB, no ischemia, EF 58%  . COLONOSCOPY    . COLONOSCOPY WITH PROPOFOL N/A 04/16/2018   Procedure: COLONOSCOPY WITH PROPOFOL;  Surgeon: Rush Landmark Telford Nab., MD;  Location: [place];  Service: Gastroenterology;  Laterality: N/A;  . ESOPHAGOGASTRODUODENOSCOPY (EGD) WITH PROPOFOL N/A 04/14/2018   Procedure: ESOPHAGOGASTRODUODENOSCOPY (EGD) WITH PROPOFOL;  Surgeon: Yetta Flock, MD;  Location: [place];  Service: Gastroenterology;  Laterality: N/A;  . FEMORAL-POPLITEAL BYPASS GRAFT Right 04/10/2018   Procedure: REPAIR RIGHT FEMORAL ARTERY PSEUDOANEURYSM, RETROPERITONEAL EXPOSURE OF ILIAC ARTERY, RIGHT POPLITEAL EMBOLECTOMY;  Surgeon: Angelia Mould, MD;  Location: [place];  Service: Vascular;  Laterality: Right;  . FLEXIBLE SIGMOIDOSCOPY N/A 06/17/2019   Procedure: FLEXIBLE SIGMOIDOSCOPY;  Surgeon: Lavena Bullion, DO;  [facility];  Service: Gastroenterology;  Laterality: N/A;  . HEMOSTASIS CLIP PLACEMENT  06/17/2019   Procedure: HEMOSTASIS CLIP PLACEMENT;  Surgeon: Lavena Bullion, DO;  [facility];  Service: Gastroenterology;;  . LYMPH NODE DISSECTION Right 06/07/2013   Procedure: LYMPH NODE DISSECTION;  Surgeon: [name], MD;  Location: [place];  Service: Thoracic;  Laterality: Right;  . PATCH ANGIOPLASTY Right 04/10/2018   Procedure: PATCH  ANGIOPLASTY OF RIGHT FEMORAL ARTERY USING BOVINE PATCH, PATCH ANGIOPLASTY OF RIGHT POPLITEAL ARTERY USING BOVINE PATCH;  Surgeon: Angelia Mould, MD;  Location: [place];  Service: Vascular;  Laterality: Right;  . SCHLEROTHERAPY  06/17/2019   Procedure: SCHLEROTHERAPY OF VARICES;  Surgeon: Lavena Bullion, DO;  [facility];  Service: Gastroenterology;;  . [place]   "large incision from chest to up to shoulder, the nerves were tied together, for raynaud's  . THORACOTOMY  06/07/2013   Procedure: MINI/LIMITED THORACOTOMY; right middle lobe wedge resection;  Surgeon: [name], MD;  Location: [facility]  OR;  Service: Thoracic;;  . TONSILLECTOMY  child  . TOTAL ABDOMINAL HYSTERECTOMY  1980's    W/ BSO  . TOTAL HIP ARTHROPLASTY Right 04/28/2014   Procedure: RIGHT TOTAL HIP ARTHROPLASTY ANTERIOR APPROACH;  Surgeon: [name], MD;  [facility];  Service: Orthopedics;  Laterality: Right;  . TRANSTHORACIC ECHOCARDIOGRAM  12/11/2008   ef 94-70%, grade 1 diastolic dysfunction/  mild LAE/  mild AR and MR/  trivial TR  . VIDEO ASSISTED THORACOSCOPY (VATS)/WEDGE RESECTION Right 06/07/2013   Procedure: VIDEO ASSISTED THORACOSCOPY (VATS)/right upper lobectomy, On Q;  Surgeon: [name], MD;  Location: [place];  Service: Thoracic;  Laterality: Right;  [place] VIDEO BRONCHOSCOPY N/A 06/07/2013   Procedure: VIDEO BRONCHOSCOPY;  Surgeon: [name], MD;  Location: [name] OR;  Service:  Thoracic;  Laterality: N/A;  . VIDEO BRONCHOSCOPY WITH ENDOBRONCHIAL NAVIGATION N/A 05/04/2013   Procedure: VIDEO BRONCHOSCOPY WITH ENDOBRONCHIAL NAVIGATION;  Surgeon: [name], MD;  Location: [place];  Service: Thoracic;  Laterality: N/A;   Social History   Social History Narrative   Lives with husband, daughter and grandchild local.   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 Fluad Quad(high Dose 65+) 11/03/2018, 12/14/2019  . Influenza Split 11/21/2011  . Influenza Whole 11/29/2007, 11/29/2008, 11/29/2009  . Influenza, High Dose Seasonal PF 10/30/2012, 01/02/2015, 11/12/2017  . Influenza,inj,Quad PF,6+ Mos 11/22/2013, 11/14/2015  . Influenza-Unspecified 11/13/2016, 11/12/2017  . PFIZER SARS-COV-2 Vaccination 04/12/2019, 05/03/2019, 01/20/2020  . Pneumococcal Conjugate-13 05/22/2015  . Pneumococcal Polysaccharide-23 06/13/2013  . Tdap 08/17/2017  . Zoster 01/27/2014     Objective: Vital Signs: There were no vitals taken for this visit.   Physical Exam   Musculoskeletal Exam: ***  CDAI Exam: CDAI Score: - Patient Global: -; Provider Global: - Swollen: -; Tender: - Joint Exam 03/05/2020   No joint exam has been documented for this visit   There is currently no information documented on the homunculus. Go to the Rheumatology activity and complete the homunculus joint exam.  Investigation: No additional findings.  Imaging: No results found.  Recent Labs: Lab Results  Component Value Date   WBC 3.7 (L) 02/15/2020   HGB 10.9 (L) 02/15/2020   PLT 177 02/15/2020   NA 134 (L) 02/15/2020   K 4.2 02/15/2020   CL 101 02/15/2020   CO2 26 02/15/2020   GLUCOSE 101 (H) 02/15/2020   BUN 22 02/15/2020   CREATININE 0.96 02/15/2020   BILITOT 0.2 (L) 02/15/2020   ALKPHOS 34 (L) 02/15/2020   AST 26 02/15/2020   ALT 16 02/15/2020   PROT 6.8 02/15/2020   ALBUMIN 3.9 02/15/2020    CALCIUM 8.9 02/15/2020   GFRAA 51 (L) 09/28/2019   QFTBGOLDPLUS NEGATIVE 06/28/2019    Speciality Comments: No specialty comments available.  Procedures:  No procedures performed Allergies: Amlodipine, Prochlorperazine edisylate, Aspirin, Cymbalta [duloxetine hcl], and Pamelor [nortriptyline hcl]   Assessment / Plan:     Visit Diagnoses: Rheumatoid arthritis of multiple sites with negative rheumatoid factor ([place])  High risk medication use  Age-related osteoporosis without current pathological fracture  Raynaud's disease without gangrene  Sicca syndrome (HCC)  Hair loss  Status post total replacement of right hip  Failure of right total hip arthroplasty, subsequent encounter  Anemia of chronic disease  Essential hypertension  Elevated cholesterol  Other idiopathic scoliosis, thoracolumbar region  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HCC)  Malignant neoplasm of upper lobe of right lung (  [place])  Orders: No orders of the defined types were placed in this encounter.  No orders of the defined types were placed in this encounter.   Face-to-face time spent with patient was *** minutes. Greater than 50% of time was spent in counseling and coordination of care.  Follow-Up Instructions: No follow-ups on file.   Ofilia Neas, PA-C  Note - This record has been created using Dragon software.  Chart creation errors have been sought, but may not always  have been located. Such creation errors do not reflect on  the standard of medical care.

## 2020-02-28 ENCOUNTER — Ambulatory Visit: Payer: Medicare PPO | Admitting: Rheumatology

## 2020-02-28 DIAGNOSIS — E270 Other adrenocortical overactivity: Secondary | ICD-10-CM

## 2020-02-28 DIAGNOSIS — E7800 Pure hypercholesterolemia, unspecified: Secondary | ICD-10-CM

## 2020-02-28 DIAGNOSIS — M0609 Rheumatoid arthritis without rheumatoid factor, multiple sites: Secondary | ICD-10-CM

## 2020-02-28 DIAGNOSIS — Z79899 Other long term (current) drug therapy: Secondary | ICD-10-CM

## 2020-02-28 DIAGNOSIS — C3411 Malignant neoplasm of upper lobe, right bronchus or lung: Secondary | ICD-10-CM

## 2020-02-28 DIAGNOSIS — M810 Age-related osteoporosis without current pathological fracture: Secondary | ICD-10-CM

## 2020-02-28 DIAGNOSIS — I7300 Raynaud's syndrome without gangrene: Secondary | ICD-10-CM

## 2020-02-28 DIAGNOSIS — Z96641 Presence of right artificial hip joint: Secondary | ICD-10-CM

## 2020-02-28 DIAGNOSIS — D638 Anemia in other chronic diseases classified elsewhere: Secondary | ICD-10-CM

## 2020-02-28 DIAGNOSIS — M3500 Sicca syndrome, unspecified: Secondary | ICD-10-CM

## 2020-02-28 DIAGNOSIS — I10 Essential (primary) hypertension: Secondary | ICD-10-CM

## 2020-02-28 DIAGNOSIS — M4125 Other idiopathic scoliosis, thoracolumbar region: Secondary | ICD-10-CM

## 2020-02-28 DIAGNOSIS — R29890 Loss of height: Secondary | ICD-10-CM

## 2020-03-01 NOTE — Progress Notes (Signed)
Office Visit Note  Patient: [name]             Date of Birth: October 21, 1930           MRN: [id number]             PCP: Darreld Mclean, MD Referring: Darreld Mclean, MD Visit Date: 03/15/2020 Occupation: @GUAROCC @  Subjective:  Medication management   History of Present Illness: [name] is a 87 y.o. female with history of seropositive rheumatoid arthritis.  She states she has been doing very well on Orencia.  She is able to do much more and has been more active since she has been on Orencia infusions.  She continues to have some discomfort in her shoulders due to severe end-stage arthritis.  She has been also having some lower back pain and left-sided radiculopathy.  She denies any joint swelling.  She states she had recent CT scan which showed multiple new lesions most likely metastatic lung cancer.  Activities of Daily Living:  Patient reports morning stiffness for 1 hour.   Patient Denies nocturnal pain.  Difficulty dressing/grooming: Reports Difficulty climbing stairs: Reports Difficulty getting out of chair: Reports Difficulty using hands for taps, buttons, cutlery, and/or writing: Reports  Review of Systems  Constitutional: Negative for fatigue, night sweats, weight gain and weight loss.  HENT: Positive for mouth dryness. Negative for mouth sores, trouble swallowing, trouble swallowing and nose dryness.   Eyes: Positive for dryness. Negative for pain, redness and visual disturbance.  Respiratory: Negative for cough, shortness of breath and difficulty breathing.   Cardiovascular: Positive for swelling in legs/feet. Negative for chest pain, palpitations, hypertension and irregular heartbeat.  Gastrointestinal: Positive for constipation. Negative for blood in stool and diarrhea.  Endocrine: Positive for cold intolerance. Negative for increased urination.  Genitourinary: Negative for difficulty urinating and vaginal dryness.  Musculoskeletal: Positive for  arthralgias, gait problem, joint pain, muscle weakness and morning stiffness. Negative for joint swelling, myalgias, muscle tenderness and myalgias.  Skin: Negative for color change, rash, hair loss, skin tightness, ulcers and sensitivity to sunlight.  Allergic/Immunologic: Negative for susceptible to infections.  Neurological: Positive for numbness. Negative for dizziness, memory loss, night sweats and weakness.  Hematological: Positive for bruising/bleeding tendency. Negative for swollen glands.  Psychiatric/Behavioral: Negative for depressed mood and sleep disturbance. The patient is not nervous/anxious.     PMFS History:  Patient Active Problem List   Diagnosis Date Noted  . Acute posthemorrhagic anemia   . Rectal bleed 06/17/2019  . Grade II internal hemorrhoids   . Rectal ulcer   . Popliteal artery occlusion, right ([place]) 04/10/2018  . HTN (hypertension) 04/10/2018  . Femoral artery pseudo-aneurysm, right ([place]) 04/09/2018  . Anemia of chronic disease 02/24/2018  . Unstable right hip arthroplasty 01/24/2018  . History of revision of total replacement of right hip joint 01/24/2018  . Hypovolemic shock ([place])   . Hyperkalemia   . Hyponatremia   . Failed total hip arthroplasty ([place]) 11/27/2017  . Status post revision of total hip 11/27/2017  . Elevated cholesterol 10/11/2015  . 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[place]) 10/04/2014  . Bilateral leg edema 08/01/2014  . Elevated BP 08/01/2014  . Rheumatoid arthritis involving multiple joints ([place]) 05/30/2014  . Status post total replacement of right hip 04/28/2014  . Long-term use of high-risk medication 11/11/2013  . Symptomatic anemia 11/11/2013  . Neuropathic pain 07/07/2013  . Constipation due to pain medication 07/07/2013  . Protein-calorie malnutrition, severe ([place]) 06/08/2013  .  Lung cancer, Right upper lobe 05/08/2013  . Sciatica of right side 08/13/2011  . Osteoporosis 03/07/2010  . PARESTHESIA 03/07/2010  . CT, CHEST, ABNORMAL  12/18/2008  . ABNORMAL ECHOCARDIOGRAM 12/14/2008  . Gonzalez SYNDROME 11/29/2008  . HYPOGLYCEMIA 06/29/2006  . RAYNAUD'S DISEASE 06/29/2006    Past Medical History:  Diagnosis Date  . Arthralgia of multiple joints    followed by dr Gerilyn Nestle  . Arthritis   . Cardiomyopathy ([place])   . Chronic constipation   . Chronic inflammatory arthritis    rhemotolgist-  dr a. Gerilyn Nestle [facility])  . Dry eyes    eye drops used   . GERD (gastroesophageal reflux disease)   . H/O discoid lupus erythematosus   . Hiatal hernia   . History of colon polyps   . Hypothyroidism   . Iron deficiency anemia   . LBBB (left bundle branch block) 2010  . Mitchell's disease (erythromelalgia) [facility])    neurologist-  dr patel  . Nocturia   . Non-small cell cancer of right lung [facility]) surgeon-- dr gerhardt/  oncologist-  dr Julien Nordmann--- per lov notes no recurrence/   11-18-2017 per pt denies any symptoms   dx 2015--  Stage IIA (T2b,N0,M0) , +EGFR  mutation in exon 21, non-small cell adenocarcinoma right upper lobe---  s/p  Right upper lobectomy , right middley wedge resection and node dissection---  no chemo or radiation therapy  . OA (osteoarthritis)    hands  . Osteoporosis   . PONV (postoperative nausea and vomiting)    likes phenergan  . Raynaud's phenomenon 1965  . Renal insufficiency   . Rheumatoid arthritis ([place])   . Sciatica   . Scoliosis   . Sjogren's syndrome ([place])     Family History  Problem Relation Age of Onset  . Coronary artery disease Father   . Colon cancer Father   . Diabetes Father   . Cancer Father        colon  . Other Mother 47       MVA  . Healthy Sister   . Healthy Brother   . Healthy Daughter   . Hypothyroidism Daughter   . Other Brother        killed in war  . Pneumonia Sister   . Healthy Daughter   . Esophageal cancer Neg Hx   . Kidney disease Neg Hx   . Liver disease Neg Hx    Past Surgical History:  Procedure Laterality Date  . ANTERIOR HIP REVISION Right  11/27/2017   Procedure: RIGHT HIP ACETABULAR REVISION;  Surgeon: [name], MD;  Location: [facility];  Service: Orthopedics;  Laterality: Right;  . ANTERIOR HIP REVISION Right 01/24/2018   Procedure: OPEN REDUCTION OF DISLOCATED ANTERIOR HIP WITH REVISION OF LINER AND HIP BALL;  Surgeon: [name], MD;  Location: [facility];  Service: Orthopedics;  Laterality: Right;  . APPENDECTOMY  1950s  . BIOPSY  04/14/2018   Procedure: BIOPSY;  Surgeon: Yetta Flock, MD;  Location: [place];  Service: Gastroenterology;;  . BIOPSY  04/16/2018   Procedure: BIOPSY;  Surgeon: Irving Copas., MD;  Location[facility];  Service: Gastroenterology;;  . CARDIOVASCULAR STRESS TEST  12/2008    mild fixed basal to mid septal perfusion defect felt likely due to artifact from LBBB, no ischemia, EF 58%  . COLONOSCOPY    . COLONOSCOPY WITH PROPOFOL N/A 04/16/2018   Procedure: COLONOSCOPY WITH PROPOFOL;  Surgeon: Rush Landmark Telford Nab., MD;  Location: [place];  Service: Gastroenterology;  Laterality: N/A;  . ESOPHAGOGASTRODUODENOSCOPY (EGD) WITH PROPOFOL N/A 04/14/2018   Procedure: ESOPHAGOGASTRODUODENOSCOPY (EGD) WITH PROPOFOL;  Surgeon: Yetta Flock, MD;  Location: [place];  Service: Gastroenterology;  Laterality: N/A;  . FEMORAL-POPLITEAL BYPASS GRAFT Right 04/10/2018   Procedure: REPAIR RIGHT FEMORAL ARTERY PSEUDOANEURYSM, RETROPERITONEAL EXPOSURE OF ILIAC ARTERY, RIGHT POPLITEAL EMBOLECTOMY;  Surgeon: Angelia Mould, MD;  Location: [place];  Service: Vascular;  Laterality: Right;  . FLEXIBLE SIGMOIDOSCOPY N/A 06/17/2019   Procedure: FLEXIBLE SIGMOIDOSCOPY;  Surgeon: Lavena Bullion, DO;  [facility];  Service: Gastroenterology;  Laterality: N/A;  . HEMOSTASIS CLIP PLACEMENT  06/17/2019   Procedure: HEMOSTASIS CLIP PLACEMENT;  Surgeon: Lavena Bullion, DO;  [facility];  Service: Gastroenterology;;  . LYMPH NODE DISSECTION Right  06/07/2013   Procedure: LYMPH NODE DISSECTION;  Surgeon: [name], MD;  Location: [place];  Service: Thoracic;  Laterality: Right;  . PATCH ANGIOPLASTY Right 04/10/2018   Procedure: PATCH  ANGIOPLASTY OF RIGHT FEMORAL ARTERY USING BOVINE PATCH, PATCH ANGIOPLASTY OF RIGHT POPLITEAL ARTERY USING BOVINE PATCH;  Surgeon: Angelia Mould, MD;  Location: [place];  Service: Vascular;  Laterality: Right;  . SCHLEROTHERAPY  06/17/2019   Procedure: SCHLEROTHERAPY OF VARICES;  Surgeon: Lavena Bullion, DO;  [facility];  Service: Gastroenterology;;  . [place]   "large incision from chest to up to shoulder, the nerves were tied together, for raynaud's  . THORACOTOMY  06/07/2013   Procedure: MINI/LIMITED THORACOTOMY; right middle lobe wedge resection;  Surgeon: [name], MD;  Location: [place];  Service: Thoracic;;  . TONSILLECTOMY  child  . TOTAL ABDOMINAL HYSTERECTOMY  1980's    W/ BSO  . TOTAL HIP ARTHROPLASTY Right 04/28/2014   Procedure: RIGHT TOTAL HIP ARTHROPLASTY ANTERIOR APPROACH;  Surgeon: [name], MD;  [facility];  Service: Orthopedics;  Laterality: Right;  . TRANSTHORACIC ECHOCARDIOGRAM  12/11/2008   ef 80-99%, grade 1 diastolic dysfunction/  mild LAE/  mild AR and MR/  trivial TR  . VIDEO ASSISTED THORACOSCOPY (VATS)/WEDGE RESECTION Right 06/07/2013   Procedure: VIDEO ASSISTED THORACOSCOPY (VATS)/right upper lobectomy, On Q;  Surgeon: [name], MD;  Location: [place];  Service: Thoracic;  Laterality: Right;  [place] VIDEO BRONCHOSCOPY N/A 06/07/2013   Procedure: VIDEO BRONCHOSCOPY;  Surgeon: [name], MD;  Location: [facility] OR;  Service: Thoracic;  Laterality: N/A;  . VIDEO BRONCHOSCOPY WITH ENDOBRONCHIAL NAVIGATION N/A 05/04/2013   Procedure: VIDEO BRONCHOSCOPY WITH ENDOBRONCHIAL NAVIGATION;  Surgeon: [name], MD;  Location: [place];  Service: Thoracic;  Laterality: N/A;   Social History   Social History  Narrative   Lives with husband, daughter and grandchild local.   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 Fluad Quad(high Dose 65+) 11/03/2018, 12/14/2019  . Influenza Split 11/21/2011  . Influenza Whole 11/29/2007, 11/29/2008, 11/29/2009  . Influenza, High Dose Seasonal PF 10/30/2012, 01/02/2015, 11/12/2017  . Influenza,inj,Quad PF,6+ Mos 11/22/2013, 11/14/2015  . Influenza-Unspecified 11/13/2016, 11/12/2017  . PFIZER(Purple Top)SARS-COV-2 Vaccination 04/12/2019, 05/03/2019, 01/20/2020  . Pneumococcal Conjugate-13 05/22/2015  . Pneumococcal Polysaccharide-23 06/13/2013  . Tdap 08/17/2017  . Zoster 01/27/2014     Objective: Vital Signs: BP 137/67 (BP Location: Left Arm, Patient Position: Sitting, Cuff Size: Normal)   Pulse 76   Resp 17   Ht 5' (1.524 m)   Wt 107 lb (48.5 kg)   BMI 20.90 kg/m  Physical Exam Vitals and nursing note reviewed.  Constitutional:      Appearance: She is well-developed and well-nourished.  HENT:     Head: Normocephalic and atraumatic.  Eyes:     Extraocular Movements: EOM normal.     Conjunctiva/sclera: Conjunctivae normal.  Cardiovascular:     Rate and Rhythm: Normal rate and regular rhythm.     Pulses: Intact distal pulses.     Heart sounds: Normal heart sounds.  Pulmonary:     Effort: Pulmonary effort is normal.     Breath sounds: Normal breath sounds.  Abdominal:     General: Bowel sounds are normal.     Palpations: Abdomen is soft.  Musculoskeletal:     Cervical back: Normal range of motion.  Lymphadenopathy:     Cervical: No cervical adenopathy.  Skin:    General: Skin is warm and dry.     Capillary Refill: Capillary refill takes less than 2 seconds.  Neurological:     Mental Status: She is alert and oriented to person, place, and time.  Psychiatric:        Mood and Affect: Mood and affect normal.        Behavior: Behavior normal.       Musculoskeletal Exam: C-spine was in good range of motion.  Shoulder joint abduction is limited to 30 degrees which is painful.  Elbow joints and wrist joints are in good range of motion without synovitis.  She had no synovitis over MCPs.  Synovial thickening was noted.  She has DIP and PIP prominence and incomplete fist formation.  Hip joints and knee joints with good range of motion.  She had no tenderness over ankles or MTPs.  CDAI Exam: CDAI Score: 3  Patient Global: 5 mm; Provider Global: 5 mm Swollen: 0 ; Tender: 2  Joint Exam 03/15/2020      Right  Left  Glenohumeral   Tender   Tender     Investigation: No additional findings.  Imaging: CT Chest Wo Contrast  Result Date: 03/08/2020 CLINICAL DATA:  87 year old female with history of non-small cell lung cancer. Staging examination. EXAM: CT CHEST WITHOUT CONTRAST TECHNIQUE: Multidetector CT imaging of the chest was performed following the standard protocol without IV contrast. COMPARISON:  Chest CT 03/10/2019. FINDINGS: Cardiovascular: Heart size is normal. There is no significant pericardial fluid, thickening or pericardial calcification. Aortic atherosclerosis. No definite coronary artery calcifications. Mediastinum/Nodes: No pathologically enlarged mediastinal or hilar lymph nodes. Please note that accurate exclusion of hilar adenopathy is limited on noncontrast CT scans. Several densely calcified right hilar lymph nodes are incidentally noted. Large hiatal hernia. No axillary lymphadenopathy. Lungs/Pleura: Status post right upper lobectomy with compensatory hyperexpansion of the right middle and lower lobes. Multiple small pulmonary nodules increased in number and size compared to the prior study, concerning for widespread metastatic disease to the lungs. The largest of these is in the right lower lobe (axial image 42 of series 5) measuring 8 x 4 mm (mean diameter of 6 mm) and in the medial aspect of the right lower lobe (axial image  89 of series 5) measuring 8 x 6 mm (mean diameter of 7 mm). No acute consolidative airspace disease. No pleural effusions. Mild diffuse bronchial wall thickening with very mild centrilobular and paraseptal emphysema. Upper Abdomen: Aortic atherosclerosis. Musculoskeletal: There are no aggressive appearing lytic or blastic lesions noted in the visualized portions of the skeleton. IMPRESSION: 1. Multiple new and enlarging small pulmonary nodules scattered throughout the lungs bilaterally, highly concerning  for widespread metastatic disease. 2. No definite mediastinal or hilar lymphadenopathy noted at this time. 3. Aortic atherosclerosis. 4. Large hiatal hernia. Aortic Atherosclerosis (ICD10-I70.0). Electronically Signed   By: Vinnie Langton M.D.   On: 03/08/2020 13:47    Recent Labs: Lab Results  Component Value Date   WBC 3.7 (L) 02/15/2020   HGB 10.9 (L) 02/15/2020   PLT 177 02/15/2020   NA 134 (L) 02/15/2020   K 4.2 02/15/2020   CL 101 02/15/2020   CO2 26 02/15/2020   GLUCOSE 101 (H) 02/15/2020   BUN 22 02/15/2020   CREATININE 0.96 02/15/2020   BILITOT 0.2 (L) 02/15/2020   ALKPHOS 34 (L) 02/15/2020   AST 26 02/15/2020   ALT 16 02/15/2020   PROT 6.8 02/15/2020   ALBUMIN 3.9 02/15/2020   CALCIUM 8.9 02/15/2020   GFRAA 51 (L) 09/28/2019   QFTBGOLDPLUS NEGATIVE 06/28/2019    Speciality Comments: No specialty comments available.  Procedures:  No procedures performed Allergies: Amlodipine, Prochlorperazine edisylate, Aspirin, Cymbalta [duloxetine hcl], and Pamelor [nortriptyline hcl]   Assessment / Plan:     Visit Diagnoses: Rheumatoid arthritis of multiple sites with negative rheumatoid factor (HCC) - RF negative, anti-CCP negative: Patient had no synovitis on my examination today.  She has multiple contractures due to severe rheumatoid arthritis.  Her shoulder joint abduction is very limited patient refused with her routine activities.  High risk medication use - Orencia IV now  (previously on Arava-intolerance).  Labs have been stable.  She continues to have mild neutropenia, anemia and low GFR.  Hair loss-improved since she has been on Orencia.  Raynaud's disease without gangrene-currently not active.  Sicca syndrome (HCC)-she has been using over-the-counter products which has been helpful.  Age-related osteoporosis without current pathological fracture - DEXA 01/28/2019: The BMD measured at Femur Neck is 0.636 g/cm2 with a T-score of -2.9. last prolia injection: 11/09/2019.  Height loss-secondary to osteoporosis  Status post total replacement of right hip-she has some discomfort with the range of motion.  Anemia of chronic disease  Essential hypertension-blood pressure is normal.  Failure of right total hip arthroplasty, subsequent encounter  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HCC)  Elevated cholesterol  Other idiopathic scoliosis, thoracolumbar region  Malignant neoplasm of upper lobe of right lung (HCC)-recent CT scan showed new lesions and most likely metastatic lung disease.  Patient has been followed by Dr. Julien Nordmann.  I am uncertain if Orencia IV should be continued or not.  According to up-to-date the risk of malignant lung disease is less than 1%.  I will send a message to Dr. Julien Nordmann and take as advise.  Patient is hesitant to stop Orencia as she had remarkable improvement in her quality of life.  Orders: No orders of the defined types were placed in this encounter.  No orders of the defined types were placed in this encounter.    Follow-Up Instructions: Return in about 4 months (around 07/13/2020) for Rheumatoid arthritis.   Bo Merino, MD  Note - This record has been created using Editor, commissioning.  Chart creation errors have been sought, but may not always  have been located. Such creation errors do not reflect on  the standard of medical care.

## 2020-03-05 ENCOUNTER — Ambulatory Visit: Payer: Medicare PPO | Admitting: Rheumatology

## 2020-03-05 DIAGNOSIS — C3411 Malignant neoplasm of upper lobe, right bronchus or lung: Secondary | ICD-10-CM

## 2020-03-05 DIAGNOSIS — T84010D Broken internal right hip prosthesis, subsequent encounter: Secondary | ICD-10-CM

## 2020-03-05 DIAGNOSIS — M0609 Rheumatoid arthritis without rheumatoid factor, multiple sites: Secondary | ICD-10-CM

## 2020-03-05 DIAGNOSIS — M4125 Other idiopathic scoliosis, thoracolumbar region: Secondary | ICD-10-CM

## 2020-03-05 DIAGNOSIS — Z79899 Other long term (current) drug therapy: Secondary | ICD-10-CM

## 2020-03-05 DIAGNOSIS — Z96641 Presence of right artificial hip joint: Secondary | ICD-10-CM

## 2020-03-05 DIAGNOSIS — M3500 Sicca syndrome, unspecified: Secondary | ICD-10-CM

## 2020-03-05 DIAGNOSIS — I10 Essential (primary) hypertension: Secondary | ICD-10-CM

## 2020-03-05 DIAGNOSIS — D638 Anemia in other chronic diseases classified elsewhere: Secondary | ICD-10-CM

## 2020-03-05 DIAGNOSIS — L659 Nonscarring hair loss, unspecified: Secondary | ICD-10-CM

## 2020-03-05 DIAGNOSIS — M810 Age-related osteoporosis without current pathological fracture: Secondary | ICD-10-CM

## 2020-03-05 DIAGNOSIS — I7300 Raynaud's syndrome without gangrene: Secondary | ICD-10-CM

## 2020-03-05 DIAGNOSIS — E7800 Pure hypercholesterolemia, unspecified: Secondary | ICD-10-CM

## 2020-03-05 DIAGNOSIS — E270 Other adrenocortical overactivity: Secondary | ICD-10-CM

## 2020-03-08 ENCOUNTER — Ambulatory Visit (HOSPITAL_COMMUNITY)
Admission: RE | Admit: 2020-03-08 | Discharge: 2020-03-08 | Disposition: A | Discharge status: Home / Self Care | Payer: Medicare PPO | Source: Ambulatory Visit | Attending: Internal Medicine | Admitting: Internal Medicine

## 2020-03-08 ENCOUNTER — Other Ambulatory Visit: Payer: Self-pay

## 2020-03-08 DIAGNOSIS — C3490 Malignant neoplasm of unspecified part of unspecified bronchus or lung: Secondary | ICD-10-CM | POA: Diagnosis not present

## 2020-03-08 DIAGNOSIS — Z85118 Personal history of other malignant neoplasm of bronchus and lung: Secondary | ICD-10-CM | POA: Diagnosis not present

## 2020-03-08 DIAGNOSIS — J984 Other disorders of lung: Secondary | ICD-10-CM | POA: Diagnosis not present

## 2020-03-08 DIAGNOSIS — J432 Centrilobular emphysema: Secondary | ICD-10-CM | POA: Diagnosis not present

## 2020-03-08 DIAGNOSIS — K449 Diaphragmatic hernia without obstruction or gangrene: Secondary | ICD-10-CM | POA: Diagnosis not present

## 2020-03-09 ENCOUNTER — Encounter: Payer: Self-pay | Admitting: Family Medicine

## 2020-03-09 ENCOUNTER — Telehealth: Payer: Self-pay | Admitting: Internal Medicine

## 2020-03-09 NOTE — Telephone Encounter (Signed)
Scheduled office visit per 1/21 schedule message. Patient is aware.

## 2020-03-12 ENCOUNTER — Other Ambulatory Visit (HOSPITAL_BASED_OUTPATIENT_CLINIC_OR_DEPARTMENT_OTHER): Payer: Self-pay | Admitting: Family Medicine

## 2020-03-12 DIAGNOSIS — Z1231 Encounter for screening mammogram for malignant neoplasm of breast: Secondary | ICD-10-CM

## 2020-03-13 ENCOUNTER — Inpatient Hospital Stay: Payer: Medicare PPO | Attending: Internal Medicine | Admitting: Internal Medicine

## 2020-03-13 ENCOUNTER — Encounter: Payer: Self-pay | Admitting: Internal Medicine

## 2020-03-13 ENCOUNTER — Other Ambulatory Visit: Payer: Self-pay

## 2020-03-13 VITALS — BP 141/68 | HR 90 | Temp 98.3°F | Resp 17 | Ht 61.0 in | Wt 107.2 lb

## 2020-03-13 DIAGNOSIS — Z85118 Personal history of other malignant neoplasm of bronchus and lung: Secondary | ICD-10-CM | POA: Insufficient documentation

## 2020-03-13 DIAGNOSIS — D649 Anemia, unspecified: Secondary | ICD-10-CM | POA: Diagnosis not present

## 2020-03-13 DIAGNOSIS — D638 Anemia in other chronic diseases classified elsewhere: Secondary | ICD-10-CM

## 2020-03-13 DIAGNOSIS — C3490 Malignant neoplasm of unspecified part of unspecified bronchus or lung: Secondary | ICD-10-CM

## 2020-03-13 DIAGNOSIS — C3411 Malignant neoplasm of upper lobe, right bronchus or lung: Secondary | ICD-10-CM

## 2020-03-13 NOTE — Progress Notes (Signed)
[facility] [phone]   [phone]  OFFICE PROGRESS NOTE  Copland, Gay Filler, MD [address] [place] [address]  DIAGNOSIS: Stage IIA (T2b, N0, M0) non-small cell lung cancer, adenocarcinoma with positive EGFR mutation in exon 21 ([id number]) presented with right upper lobe lung mass diagnosed in March of 2015.  PRIOR THERAPY: Status post right upper lobectomy with wedge resection of the right middle lobe under the care of Dr. Servando Snare on 06/07/2013  CURRENT THERAPY: Observation.  INTERVAL HISTORY: [name] 87 y.o. female returns to the clinic today for follow-up visit accompanied by her caregiver Lorriane Shire. The patient is feeling fine today with no concerning complaints. She is currently undergoing treatment for her rheumatoid arthritis by Dr. Estanislado Pandy. She is currently on infusion with Orencia as well as Prolia for bone disease. She denied having any current chest pain, shortness breath, cough or hemoptysis. She has no recent weight loss or night sweats. She has no nausea, vomiting, diarrhea or constipation. She had repeat CT scan of the chest performed recently and she is here for evaluation and discussion of her risk her results.  MEDICAL HISTORY: Past Medical History:  Diagnosis Date  . Arthralgia of multiple joints    followed by dr Gerilyn Nestle  . Arthritis   . Cardiomyopathy ([place])   . Chronic constipation   . Chronic inflammatory arthritis    rhemotolgist-  dr a. Gerilyn Nestle [facility])  . Dry eyes    eye drops used   . GERD (gastroesophageal reflux disease)   . H/O discoid lupus erythematosus   . Hiatal hernia   . History of colon polyps   . Hypothyroidism   . Iron deficiency anemia   . LBBB (left bundle branch block) 2010  . Mitchell's disease (erythromelalgia) [facility])    neurologist-  dr patel  . Nocturia   . Non-small cell cancer of right lung [facility]) surgeon-- dr gerhardt/  oncologist-  dr Julien Nordmann---  per lov notes no recurrence/   11-18-2017 per pt denies any symptoms   dx 2015--  Stage IIA (T2b,N0,M0) , +EGFR  mutation in exon 21, non-small cell adenocarcinoma right upper lobe---  s/p  Right upper lobectomy , right middley wedge resection and node dissection---  no chemo or radiation therapy  . OA (osteoarthritis)    hands  . Osteoporosis   . PONV (postoperative nausea and vomiting)    likes phenergan  . Raynaud's phenomenon 1965  . Renal insufficiency   . Rheumatoid arthritis ([place])   . Sciatica   . Scoliosis   . Sjogren's syndrome ([place])     ALLERGIES:  is allergic to amlodipine, prochlorperazine edisylate, aspirin, cymbalta [duloxetine hcl], and pamelor [nortriptyline hcl].  MEDICATIONS:  Current Outpatient Medications  Medication Sig Dispense Refill  . Abatacept (ORENCIA IV) Inject into the vein every 30 (thirty) days.    . Acetaminophen (TYLENOL EXTRA STRENGTH PO) Take 1-2 tablets by mouth every 6 (six) hours as needed (pain).     . Acetaminophen-Codeine 300-30 MG tablet TAKE 1-2 TABLETS BY MOUTH EVERY 8 (EIGHT) HOURS AS NEEDED FOR MODERATE PAIN. 30 tablet 1  . Biotin 1000 MCG tablet Take 1,000 mcg by mouth daily.     . Calcium Carbonate-Vitamin D (CALCIUM 500 + D) 500-125 MG-UNIT TABS Take 1 tablet by mouth daily.     . carvedilol (COREG) 3.125 MG tablet TAKE 1 TABLET (3.125 MG TOTAL) BY MOUTH 2 (TWO) TIMES DAILY. 180 tablet  3  . denosumab (PROLIA) 60 MG/ML SOSY injection Inject 60 mg into the skin every 6 (six) months. Last injection 04-22-2019    . Diclofenac Sodium 3 % GEL Apply a pea sized amount to affected joint twice daily as needed. (Patient taking differently: Apply 1 application topically 2 (two) times daily as needed (pain). Apply a pea sized amount to affected joint twice daily as needed.) 100 g 2  . docusate sodium (STOOL SOFTENER) 100 MG capsule Take 100 mg by mouth 2 (two) times daily.      Marland Kitchen gabapentin (NEURONTIN) 300 MG capsule TAKE 1 CAPSULE BY MOUTH IN THE  MORNING, 1 IN THE AFTERNOON, AND 2 AT BEDTIME 360 capsule 1  . Glucosamine-Chondroit-Vit C-Mn (GLUCOSAMINE CHONDR 1500 COMPLX PO) Take 1 capsule by mouth daily.     Marland Kitchen levothyroxine (EUTHYROX) 75 MCG tablet TAKE 1 TABLET BY MOUTH ONCE DAILY BEFORE BREAKFAST 90 tablet 1  . methocarbamol (ROBAXIN) 500 MG tablet TAKE 1 TABLET (500 MG TOTAL) BY MOUTH EVERY 6 (SIX) HOURS AS NEEDED FOR MUSCLE SPASMS. 40 tablet 0  . Multiple Vitamin (MULTIVITAMIN) tablet Take 1 tablet by mouth daily.      Marland Kitchen omeprazole (PRILOSEC) 20 MG capsule Take 1 capsule (20 mg total) by mouth daily. 180 capsule 1  . Probiotic Product (PROBIOTIC PO) Take 1 capsule by mouth daily.      No current facility-administered medications for this visit.    SURGICAL HISTORY:  Past Surgical History:  Procedure Laterality Date  . ANTERIOR HIP REVISION Right 11/27/2017   Procedure: RIGHT HIP ACETABULAR REVISION;  Surgeon: [name], MD;  Location: [facility];  Service: Orthopedics;  Laterality: Right;  . ANTERIOR HIP REVISION Right 01/24/2018   Procedure: OPEN REDUCTION OF DISLOCATED ANTERIOR HIP WITH REVISION OF LINER AND HIP BALL;  Surgeon: [name], MD;  Location: [facility];  Service: Orthopedics;  Laterality: Right;  . APPENDECTOMY  1950s  . BIOPSY  04/14/2018   Procedure: BIOPSY;  Surgeon: Yetta Flock, MD;  Location: [facility];  Service: Gastroenterology;;  . BIOPSY  04/16/2018   Procedure: BIOPSY;  Surgeon: Irving Copas., MD;  Location: [facility];  Service: Gastroenterology;;  . CARDIOVASCULAR STRESS TEST  12/2008    mild fixed basal to mid septal perfusion defect felt likely due to artifact from LBBB, no ischemia, EF 58%  . COLONOSCOPY    . COLONOSCOPY WITH PROPOFOL N/A 04/16/2018   Procedure: COLONOSCOPY WITH PROPOFOL;  Surgeon: Rush Landmark Telford Nab., MD;  Location: [facility];  Service: Gastroenterology;  Laterality: N/A;  . ESOPHAGOGASTRODUODENOSCOPY (EGD) WITH PROPOFOL N/A 04/14/2018    Procedure: ESOPHAGOGASTRODUODENOSCOPY (EGD) WITH PROPOFOL;  Surgeon: Yetta Flock, MD;  Location: [facility];  Service: Gastroenterology;  Laterality: N/A;  . FEMORAL-POPLITEAL BYPASS GRAFT Right 04/10/2018   Procedure: REPAIR RIGHT FEMORAL ARTERY PSEUDOANEURYSM, RETROPERITONEAL EXPOSURE OF ILIAC ARTERY, RIGHT POPLITEAL EMBOLECTOMY;  Surgeon: Angelia Mould, MD;  Location: [facility];  Service: Vascular;  Laterality: Right;  . FLEXIBLE SIGMOIDOSCOPY N/A 06/17/2019   Procedure: FLEXIBLE SIGMOIDOSCOPY;  Surgeon: Lavena Bullion, DO;  [facility];  Service: Gastroenterology;  Laterality: N/A;  . HEMOSTASIS CLIP PLACEMENT  06/17/2019   Procedure: HEMOSTASIS CLIP PLACEMENT;  Surgeon: Lavena Bullion, DO;  Location: [facility];  Service: Gastroenterology;;  . LYMPH NODE DISSECTION Right 06/07/2013   Procedure: LYMPH NODE DISSECTION;  Surgeon: [name], MD;  Location: [facility];  Service: Thoracic;  Laterality: Right;  . PATCH ANGIOPLASTY Right 04/10/2018   Procedure:  PATCH  ANGIOPLASTY OF RIGHT FEMORAL ARTERY USING BOVINE PATCH, PATCH ANGIOPLASTY OF RIGHT POPLITEAL ARTERY USING BOVINE PATCH;  Surgeon: Angelia Mould, MD;  Location: [facility];  Service: Vascular;  Laterality: Right;  . SCHLEROTHERAPY  06/17/2019   Procedure: SCHLEROTHERAPY OF VARICES;  Surgeon: Lavena Bullion, DO;  Location[facility];  Service: Gastroenterology;;  . [place]   "large incision from chest to up to shoulder, the nerves were tied together, for raynaud's  . THORACOTOMY  06/07/2013   Procedure: MINI/LIMITED THORACOTOMY; right middle lobe wedge resection;  Surgeon: [name], MD;  Location: [facility];  Service: Thoracic;;  . TONSILLECTOMY  child  . TOTAL ABDOMINAL HYSTERECTOMY  1980's    W/ BSO  . TOTAL HIP ARTHROPLASTY Right 04/28/2014   Procedure: RIGHT TOTAL HIP ARTHROPLASTY ANTERIOR APPROACH;  Surgeon: [name], MD;  Location: [facility];   Service: Orthopedics;  Laterality: Right;  . TRANSTHORACIC ECHOCARDIOGRAM  12/11/2008   ef 65-68%, grade 1 diastolic dysfunction/  mild LAE/  mild AR and MR/  trivial TR  . VIDEO ASSISTED THORACOSCOPY (VATS)/WEDGE RESECTION Right 06/07/2013   Procedure: VIDEO ASSISTED THORACOSCOPY (VATS)/right upper lobectomy, On Q;  Surgeon: [name], MD;  Location: [facility];  Service: Thoracic;  Laterality: Right;  Marland Kitchen VIDEO BRONCHOSCOPY N/A 06/07/2013   Procedure: VIDEO BRONCHOSCOPY;  Surgeon: [name], MD;  [facility] OR;  Service: Thoracic;  Laterality: N/A;  . VIDEO BRONCHOSCOPY WITH ENDOBRONCHIAL NAVIGATION N/A 05/04/2013   Procedure: VIDEO BRONCHOSCOPY WITH ENDOBRONCHIAL NAVIGATION;  Surgeon: [name], MD;  Location: [facility];  Service: Thoracic;  Laterality: N/A;    REVIEW OF SYSTEMS:  A comprehensive review of systems was negative.   PHYSICAL EXAMINATION: General appearance: alert, cooperative and no distress Head: Normocephalic, without obvious abnormality, atraumatic Neck: no adenopathy, no JVD, supple, symmetrical, trachea midline and thyroid not enlarged, symmetric, no tenderness/mass/nodules Lymph nodes: Cervical, supraclavicular, and axillary nodes normal. Resp: clear to auscultation bilaterally Back: symmetric, no curvature. ROM normal. No CVA tenderness. Cardio: regular rate and rhythm, S1, S2 normal, no murmur, click, rub or gallop GI: soft, non-tender; bowel sounds normal; no masses,  no organomegaly Extremities: extremities normal, atraumatic, no cyanosis or edema  ECOG PERFORMANCE STATUS: 1 - Symptomatic but completely ambulatory  Blood pressure (!) 141/68, pulse 90, temperature 98.3 F (36.8 C), temperature source Tympanic, resp. rate 17, height $RemoveBe'5\' 1"'XYaxepYaV$  (1.549 m), weight 107 lb 3.2 oz (48.6 kg), SpO2 100 %.  LABORATORY DATA: Lab Results  Component Value Date   WBC 3.7 (L) 02/15/2020   HGB 10.9 (L) 02/15/2020   HCT 35.2 (L) 02/15/2020   MCV 95.4 02/15/2020    PLT 177 02/15/2020      Chemistry      Component Value Date/Time   NA 134 (L) 02/15/2020 1131   NA 130 (L) 08/21/2016 1125   K 4.2 02/15/2020 1131   K 4.8 08/21/2016 1125   CL 101 02/15/2020 1131   CO2 26 02/15/2020 1131   CO2 26 08/21/2016 1125   BUN 22 02/15/2020 1131   BUN 25.6 08/21/2016 1125   CREATININE 0.96 02/15/2020 1131   CREATININE 1.08 (H) 05/03/2019 1038   CREATININE 1.2 (H) 08/21/2016 1125      Component Value Date/Time   CALCIUM 8.9 02/15/2020 1131   CALCIUM 10.0 08/21/2016 1125   ALKPHOS 34 (L) 02/15/2020 1131   ALKPHOS 54 08/21/2016 1125   AST 26 02/15/2020 1131   AST 29 03/10/2019 1052  AST 27 08/21/2016 1125   ALT 16 02/15/2020 1131   ALT 16 03/10/2019 1052   ALT 13 08/21/2016 1125   BILITOT 0.2 (L) 02/15/2020 1131   BILITOT <0.2 (L) 03/10/2019 1052   BILITOT 0.31 08/21/2016 1125       RADIOGRAPHIC STUDIES: CT Chest Wo Contrast  Result Date: 03/08/2020 CLINICAL DATA:  87 year old female with history of non-small cell lung cancer. Staging examination. EXAM: CT CHEST WITHOUT CONTRAST TECHNIQUE: Multidetector CT imaging of the chest was performed following the standard protocol without IV contrast. COMPARISON:  Chest CT 03/10/2019. FINDINGS: Cardiovascular: Heart size is normal. There is no significant pericardial fluid, thickening or pericardial calcification. Aortic atherosclerosis. No definite coronary artery calcifications. Mediastinum/Nodes: No pathologically enlarged mediastinal or hilar lymph nodes. Please note that accurate exclusion of hilar adenopathy is limited on noncontrast CT scans. Several densely calcified right hilar lymph nodes are incidentally noted. Large hiatal hernia. No axillary lymphadenopathy. Lungs/Pleura: Status post right upper lobectomy with compensatory hyperexpansion of the right middle and lower lobes. Multiple small pulmonary nodules increased in number and size compared to the prior study, concerning for widespread metastatic  disease to the lungs. The largest of these is in the right lower lobe (axial image 42 of series 5) measuring 8 x 4 mm (mean diameter of 6 mm) and in the medial aspect of the right lower lobe (axial image 89 of series 5) measuring 8 x 6 mm (mean diameter of 7 mm). No acute consolidative airspace disease. No pleural effusions. Mild diffuse bronchial wall thickening with very mild centrilobular and paraseptal emphysema. Upper Abdomen: Aortic atherosclerosis. Musculoskeletal: There are no aggressive appearing lytic or blastic lesions noted in the visualized portions of the skeleton. IMPRESSION: 1. Multiple new and enlarging small pulmonary nodules scattered throughout the lungs bilaterally, highly concerning for widespread metastatic disease. 2. No definite mediastinal or hilar lymphadenopathy noted at this time. 3. Aortic atherosclerosis. 4. Large hiatal hernia. Aortic Atherosclerosis (ICD10-I70.0). Electronically Signed   By: Vinnie Langton M.D.   On: 03/08/2020 13:47   ASSESSMENT AND PLAN:  This is a very pleasant 87 years old white female with stage IIA non-small cell lung cancer, adenocarcinoma with positive EGFR mutation in exon 21 status post right upper lobectomy as well as wedge resection of the right middle lobe in March 2015 and the patient declined adjuvant systemic chemotherapy. The patient is currently on observation and she is feeling fine today with no concerning complaints. She had repeat CT scan of the chest performed recently. I personally and independently reviewed the scans and discussed the results with the patient today. Her scan showed multiple new subcentimeter and enlarging pulmonary nodules bilaterally concerning for widespread metastatic disease but no definite mediastinal or hilar adenopathy. I discussed the scan results with the patient and recommended for her to have repeat CT scan of the chest in 3 months for further evaluation of these nodules. The nodules are currently are  below the threshold for the PET scan or a biopsy. If the nodule continues to increase in size will consider the patient for for a biopsy or PET scan for confirmation of her tissue diagnosis. The patient was advised to call immediately if she has any other concerning symptoms in the interval. For the anemia, I recommended for the patient to continue on oral iron tablets few days a week in addition to the iron rich diet.  The patient voices understanding of current disease status and treatment options and is in agreement with the current care  plan. All questions were answered. The patient knows to call the clinic with any problems, questions or concerns. We can certainly see the patient much sooner if necessary.  Disclaimer: This note was dictated with voice recognition software. Similar sounding words can inadvertently be transcribed and may not be corrected upon review.

## 2020-03-14 ENCOUNTER — Telehealth: Payer: Self-pay | Admitting: Internal Medicine

## 2020-03-14 ENCOUNTER — Ambulatory Visit (HOSPITAL_COMMUNITY)
Admission: RE | Admit: 2020-03-14 | Discharge: 2020-03-14 | Disposition: A | Discharge status: Home / Self Care | Payer: Medicare PPO | Source: Ambulatory Visit | Attending: Rheumatology | Admitting: Rheumatology

## 2020-03-14 DIAGNOSIS — M3500 Sicca syndrome, unspecified: Secondary | ICD-10-CM | POA: Diagnosis not present

## 2020-03-14 DIAGNOSIS — Z79899 Other long term (current) drug therapy: Secondary | ICD-10-CM | POA: Diagnosis not present

## 2020-03-14 DIAGNOSIS — I7300 Raynaud's syndrome without gangrene: Secondary | ICD-10-CM | POA: Diagnosis not present

## 2020-03-14 DIAGNOSIS — M0609 Rheumatoid arthritis without rheumatoid factor, multiple sites: Secondary | ICD-10-CM | POA: Diagnosis not present

## 2020-03-14 MED ORDER — ACETAMINOPHEN 325 MG PO TABS: 650.0000 mg | ORAL_TABLET | Freq: Once | ORAL | Status: DC

## 2020-03-14 MED ORDER — SODIUM CHLORIDE 0.9 % IV SOLN
500.0000 mg | INTRAVENOUS | Status: DC
  Administered 2020-03-14: 500 mg via INTRAVENOUS
  Filled 2020-03-14: qty 20

## 2020-03-14 MED ORDER — DIPHENHYDRAMINE HCL 25 MG PO CAPS: 25.0000 mg | ORAL_CAPSULE | Freq: Once | ORAL | Status: DC

## 2020-03-14 NOTE — Telephone Encounter (Signed)
Scheduled appointments per 1/25 los. Spoke to patient who is aware of appointments dates and times. Patient specifically requested appointments on Tuesdays. Lab and MD visit were scheduled per patient preference.

## 2020-03-15 ENCOUNTER — Ambulatory Visit: Payer: Medicare PPO | Admitting: Rheumatology

## 2020-03-15 ENCOUNTER — Other Ambulatory Visit: Payer: Self-pay

## 2020-03-15 ENCOUNTER — Encounter: Payer: Self-pay | Admitting: Rheumatology

## 2020-03-15 VITALS — BP 137/67 | HR 76 | Resp 17 | Ht 60.0 in | Wt 107.0 lb

## 2020-03-15 DIAGNOSIS — M810 Age-related osteoporosis without current pathological fracture: Secondary | ICD-10-CM | POA: Diagnosis not present

## 2020-03-15 DIAGNOSIS — M0609 Rheumatoid arthritis without rheumatoid factor, multiple sites: Secondary | ICD-10-CM | POA: Diagnosis not present

## 2020-03-15 DIAGNOSIS — E270 Other adrenocortical overactivity: Secondary | ICD-10-CM

## 2020-03-15 DIAGNOSIS — Z79899 Other long term (current) drug therapy: Secondary | ICD-10-CM | POA: Diagnosis not present

## 2020-03-15 DIAGNOSIS — T84010D Broken internal right hip prosthesis, subsequent encounter: Secondary | ICD-10-CM

## 2020-03-15 DIAGNOSIS — R29890 Loss of height: Secondary | ICD-10-CM

## 2020-03-15 DIAGNOSIS — C3411 Malignant neoplasm of upper lobe, right bronchus or lung: Secondary | ICD-10-CM

## 2020-03-15 DIAGNOSIS — Z96641 Presence of right artificial hip joint: Secondary | ICD-10-CM

## 2020-03-15 DIAGNOSIS — L659 Nonscarring hair loss, unspecified: Secondary | ICD-10-CM | POA: Diagnosis not present

## 2020-03-15 DIAGNOSIS — D638 Anemia in other chronic diseases classified elsewhere: Secondary | ICD-10-CM | POA: Diagnosis not present

## 2020-03-15 DIAGNOSIS — M4125 Other idiopathic scoliosis, thoracolumbar region: Secondary | ICD-10-CM

## 2020-03-15 DIAGNOSIS — I7300 Raynaud's syndrome without gangrene: Secondary | ICD-10-CM

## 2020-03-15 DIAGNOSIS — E7800 Pure hypercholesterolemia, unspecified: Secondary | ICD-10-CM

## 2020-03-15 DIAGNOSIS — M3500 Sicca syndrome, unspecified: Secondary | ICD-10-CM | POA: Diagnosis not present

## 2020-03-15 DIAGNOSIS — I10 Essential (primary) hypertension: Secondary | ICD-10-CM

## 2020-03-15 NOTE — Patient Instructions (Signed)
  COVID-19 vaccine recommendations:   COVID-19 vaccine is recommended for everyone (unless you are allergic to a vaccine component), even if you are on a medication that suppresses your immune system.   If you are on Methotrexate, Cellcept (mycophenolate), Rinvoq, Morrie Sheldon, and Olumiant- hold the medication for 1 week after each vaccine. Hold Methotrexate for 2 weeks after the single dose COVID-19 vaccine.   If you are on Orencia subcutaneous injection - hold medication one week prior to and one week after the first COVID-19 vaccine dose (only).   If you are on Orencia IV infusions- time vaccination administration so that the first COVID-19 vaccination will occur four weeks after the infusion and postpone the subsequent infusion by one week.   If you are on Cyclophosphamide or Rituxan infusions please contact your doctor prior to receiving the COVID-19 vaccine.   Do not take Tylenol or any anti-inflammatory medications (NSAIDs) 24 hours prior to the COVID-19 vaccination.   There is no direct evidence about the efficacy of the COVID-19 vaccine in individuals who are on medications that suppress the immune system.   Even if you are fully vaccinated, and you are on any medications that suppress your immune system, please continue to wear a mask, maintain at least six feet social distance and practice hand hygiene.   If you develop a COVID-19 infection, please contact your PCP or our office to determine if you need monoclonal antibody infusion.  The booster vaccine is now available for immunocompromised patients.   Please see the following web sites for updated information.   [email or web address]

## 2020-03-22 ENCOUNTER — Other Ambulatory Visit: Payer: Self-pay

## 2020-03-22 ENCOUNTER — Other Ambulatory Visit: Payer: Self-pay | Admitting: Family Medicine

## 2020-03-22 ENCOUNTER — Ambulatory Visit (HOSPITAL_BASED_OUTPATIENT_CLINIC_OR_DEPARTMENT_OTHER)
Admission: RE | Admit: 2020-03-22 | Discharge: 2020-03-22 | Disposition: A | Discharge status: Home / Self Care | Payer: Medicare PPO | Source: Ambulatory Visit | Attending: Family Medicine | Admitting: Family Medicine

## 2020-03-22 DIAGNOSIS — Z1231 Encounter for screening mammogram for malignant neoplasm of breast: Secondary | ICD-10-CM | POA: Insufficient documentation

## 2020-03-22 DIAGNOSIS — M2550 Pain in unspecified joint: Secondary | ICD-10-CM

## 2020-03-27 DIAGNOSIS — Z961 Presence of intraocular lens: Secondary | ICD-10-CM | POA: Diagnosis not present

## 2020-03-27 DIAGNOSIS — H353131 Nonexudative age-related macular degeneration, bilateral, early dry stage: Secondary | ICD-10-CM | POA: Diagnosis not present

## 2020-03-27 DIAGNOSIS — H52223 Regular astigmatism, bilateral: Secondary | ICD-10-CM | POA: Diagnosis not present

## 2020-04-11 ENCOUNTER — Ambulatory Visit (HOSPITAL_COMMUNITY)
Admission: RE | Admit: 2020-04-11 | Discharge: 2020-04-11 | Disposition: A | Discharge status: Home / Self Care | Payer: Medicare PPO | Source: Ambulatory Visit | Attending: Rheumatology | Admitting: Rheumatology

## 2020-04-11 ENCOUNTER — Other Ambulatory Visit: Payer: Self-pay

## 2020-04-11 DIAGNOSIS — M069 Rheumatoid arthritis, unspecified: Secondary | ICD-10-CM | POA: Diagnosis not present

## 2020-04-11 LAB — COMPREHENSIVE METABOLIC PANEL
ALT: 17 U/L (ref 0–44)
AST: 28 U/L (ref 15–41)
Albumin: 4 g/dL (ref 3.5–5.0)
Alkaline Phosphatase: 31 U/L — ABNORMAL LOW (ref 38–126)
Anion gap: 9 (ref 5–15)
BUN: 28 mg/dL — ABNORMAL HIGH (ref 8–23)
CO2: 25 mmol/L (ref 22–32)
Calcium: 9.3 mg/dL (ref 8.9–10.3)
Chloride: 97 mmol/L — ABNORMAL LOW (ref 98–111)
Creatinine, Ser: 1.01 mg/dL — ABNORMAL HIGH (ref 0.44–1.00)
GFR, Estimated: 54 mL/min — ABNORMAL LOW (ref >60)
Glucose, Bld: 83 mg/dL (ref 70–99)
Potassium: 4.6 mmol/L (ref 3.5–5.1)
Sodium: 131 mmol/L — ABNORMAL LOW (ref 135–145)
Total Bilirubin: 0.3 mg/dL (ref 0.3–1.2)
Total Protein: 6.5 g/dL (ref 6.5–8.1)

## 2020-04-11 LAB — CBC WITH DIFFERENTIAL/PLATELET
Abs Immature Granulocytes: 0.01 10*3/uL (ref 0.00–0.07)
Basophils Absolute: 0 10*3/uL (ref 0.0–0.1)
Basophils Relative: 1 %
Eosinophils Absolute: 0 10*3/uL (ref 0.0–0.5)
Eosinophils Relative: 1 %
HCT: 35.6 % — ABNORMAL LOW (ref 36.0–46.0)
Hemoglobin: 11.2 g/dL — ABNORMAL LOW (ref 12.0–15.0)
Immature Granulocytes: 0 %
Lymphocytes Relative: 26 %
Lymphs Abs: 1 10*3/uL (ref 0.7–4.0)
MCH: 29.9 pg (ref 26.0–34.0)
MCHC: 31.5 g/dL (ref 30.0–36.0)
MCV: 94.9 fL (ref 80.0–100.0)
Monocytes Absolute: 0.5 10*3/uL (ref 0.1–1.0)
Monocytes Relative: 12 %
Neutro Abs: 2.3 10*3/uL (ref 1.7–7.7)
Neutrophils Relative %: 60 %
Platelets: 168 10*3/uL (ref 150–400)
RBC: 3.75 MIL/uL — ABNORMAL LOW (ref 3.87–5.11)
RDW: 12.5 % (ref 11.5–15.5)
WBC: 3.9 10*3/uL — ABNORMAL LOW (ref 4.0–10.5)
nRBC: 0 % (ref 0.0–0.2)

## 2020-04-11 MED ORDER — SODIUM CHLORIDE 0.9 % IV SOLN
500.0000 mg | INTRAVENOUS | Status: DC
  Administered 2020-04-11: 500 mg via INTRAVENOUS
  Filled 2020-04-11: qty 20

## 2020-04-11 MED ORDER — ACETAMINOPHEN 325 MG PO TABS: 650.0000 mg | ORAL_TABLET | Freq: Once | ORAL | Status: DC

## 2020-04-11 MED ORDER — DIPHENHYDRAMINE HCL 25 MG PO CAPS: 25.0000 mg | ORAL_CAPSULE | Freq: Once | ORAL | Status: DC

## 2020-04-11 NOTE — Progress Notes (Signed)
Creatinine is elevated but stable.  White cell count is low and is stable.  Anemia persists.  Please forward labs to her PCP.

## 2020-04-23 ENCOUNTER — Other Ambulatory Visit: Payer: Self-pay | Admitting: Orthopaedic Surgery

## 2020-04-23 ENCOUNTER — Other Ambulatory Visit: Payer: Self-pay | Admitting: Family Medicine

## 2020-04-26 ENCOUNTER — Encounter: Payer: Self-pay | Admitting: Family Medicine

## 2020-04-26 DIAGNOSIS — R300 Dysuria: Secondary | ICD-10-CM

## 2020-04-26 MED ORDER — CEPHALEXIN 500 MG PO CAPS: 500.0000 mg | ORAL_CAPSULE | Freq: Two times a day (BID) | ORAL | 0 refills | Status: DC

## 2020-04-27 ENCOUNTER — Other Ambulatory Visit (INDEPENDENT_AMBULATORY_CARE_PROVIDER_SITE_OTHER): Payer: Medicare PPO

## 2020-04-27 ENCOUNTER — Other Ambulatory Visit: Payer: Self-pay

## 2020-04-27 DIAGNOSIS — R300 Dysuria: Secondary | ICD-10-CM

## 2020-04-29 ENCOUNTER — Encounter: Payer: Self-pay | Admitting: Family Medicine

## 2020-04-29 LAB — URINE CULTURE
MICRO NUMBER:: 11637450
SPECIMEN QUALITY:: ADEQUATE

## 2020-04-29 MED ORDER — SULFAMETHOXAZOLE-TRIMETHOPRIM 800-160 MG PO TABS: 1.0000 | ORAL_TABLET | Freq: Two times a day (BID) | ORAL | 0 refills | Status: DC

## 2020-04-29 NOTE — Addendum Note (Signed)
Addended by: Lamar Blinks C on: 04/29/2020 02:15 PM   Modules accepted: Orders

## 2020-04-29 NOTE — Progress Notes (Signed)
Received her urine culture- resistant to current treatment.  mychart message to pt- dc keflex and change over to septra  Meds ordered this encounter  Medications  . sulfamethoxazole-trimethoprim (BACTRIM DS) 800-160 MG tablet    Sig: Take 1 tablet by mouth 2 (two) times daily.    Dispense:  10 tablet    Refill:  0

## 2020-05-08 ENCOUNTER — Other Ambulatory Visit: Payer: Self-pay | Admitting: Pharmacist

## 2020-05-08 DIAGNOSIS — Z79899 Other long term (current) drug therapy: Secondary | ICD-10-CM

## 2020-05-08 DIAGNOSIS — M0609 Rheumatoid arthritis without rheumatoid factor, multiple sites: Secondary | ICD-10-CM

## 2020-05-08 NOTE — Progress Notes (Signed)
Next infusion scheduled for Orencia on 05/09/20 and due for updated orders. Diagnosis: M06.09 (RA)  Dose: Orencia 500mg  every 28 days  Last Clinic Visit: 03/15/20 Next Clinic Visit: 08/14/20  Last infusion: 04/11/20 Labs: 04/11/20. Creatinine is elevated but stable. White cell count is low and is stable. Anemia persists TB Gold: due May 2022  Orders placed for Orencia 500mg  x 2 doses along with premedication of Tylenol and Benadryl to be administered 30 minutes before medication infusion. Standing CBC with diff/platelet and CMP with GFR orders placed to be drawn every 2 months.  Next TB gold due May 2022  Called patient and provided with phone number for [facility] Medical Day [phone])  Knox Saliva, PharmD, MPH Clinical Pharmacist (Rheumatology and Pulmonology)

## 2020-05-09 ENCOUNTER — Ambulatory Visit (HOSPITAL_COMMUNITY)
Admission: RE | Admit: 2020-05-09 | Discharge: 2020-05-09 | Disposition: A | Discharge status: Home / Self Care | Payer: Medicare PPO | Source: Ambulatory Visit | Attending: Rheumatology | Admitting: Rheumatology

## 2020-05-09 ENCOUNTER — Other Ambulatory Visit: Payer: Self-pay

## 2020-05-09 DIAGNOSIS — M0609 Rheumatoid arthritis without rheumatoid factor, multiple sites: Secondary | ICD-10-CM | POA: Insufficient documentation

## 2020-05-09 MED ORDER — ACETAMINOPHEN 325 MG PO TABS: 650.0000 mg | ORAL_TABLET | ORAL | Status: DC

## 2020-05-09 MED ORDER — SODIUM CHLORIDE 0.9 % IV SOLN
500.0000 mg | INTRAVENOUS | Status: DC
  Administered 2020-05-09: 500 mg via INTRAVENOUS
  Filled 2020-05-09: qty 20

## 2020-05-09 MED ORDER — DIPHENHYDRAMINE HCL 25 MG PO CAPS: 25.0000 mg | ORAL_CAPSULE | ORAL | Status: DC

## 2020-05-11 ENCOUNTER — Encounter: Payer: Self-pay | Admitting: Rheumatology

## 2020-05-14 ENCOUNTER — Other Ambulatory Visit: Payer: Self-pay | Admitting: Pharmacist

## 2020-05-14 DIAGNOSIS — E559 Vitamin D deficiency, unspecified: Secondary | ICD-10-CM

## 2020-05-14 DIAGNOSIS — M810 Age-related osteoporosis without current pathological fracture: Secondary | ICD-10-CM

## 2020-05-14 DIAGNOSIS — Z79899 Other long term (current) drug therapy: Secondary | ICD-10-CM

## 2020-05-14 NOTE — Progress Notes (Signed)
Patient due for updated labs prior to next Prolia dose at [facility] Day. She will stop by tomorrow, 05/15/20 or 05/16/20. Lab orders for CBC, CMP, and Vit D placed today. Will f/u to place Prolia orders once labs result and if wnl.  Knox Saliva, PharmD, MPH Clinical Pharmacist (Rheumatology and Pulmonology)

## 2020-05-16 ENCOUNTER — Other Ambulatory Visit: Payer: Self-pay | Admitting: *Deleted

## 2020-05-16 DIAGNOSIS — E559 Vitamin D deficiency, unspecified: Secondary | ICD-10-CM

## 2020-05-16 DIAGNOSIS — M810 Age-related osteoporosis without current pathological fracture: Secondary | ICD-10-CM

## 2020-05-16 DIAGNOSIS — Z79899 Other long term (current) drug therapy: Secondary | ICD-10-CM

## 2020-05-17 ENCOUNTER — Other Ambulatory Visit: Payer: Self-pay | Admitting: Family Medicine

## 2020-05-17 DIAGNOSIS — M2550 Pain in unspecified joint: Secondary | ICD-10-CM

## 2020-05-21 DIAGNOSIS — Z79899 Other long term (current) drug therapy: Secondary | ICD-10-CM | POA: Diagnosis not present

## 2020-05-21 DIAGNOSIS — E559 Vitamin D deficiency, unspecified: Secondary | ICD-10-CM | POA: Diagnosis not present

## 2020-05-21 DIAGNOSIS — M810 Age-related osteoporosis without current pathological fracture: Secondary | ICD-10-CM | POA: Diagnosis not present

## 2020-05-22 ENCOUNTER — Encounter: Payer: Self-pay | Admitting: Family Medicine

## 2020-05-22 NOTE — Progress Notes (Signed)
Next infusion not yet scheduled for Prolia and due for updated orders.  Diagnosis: age-related osteoporosis  Dose: 60mg  every 6 months  Last Clinic Visit:  03/15/20 Next Clinic Visit:  08/14/20  Last infusion: 11/09/19  Labs:  - Scr 0.98 (stable) - Calcium wnl  Orders placed for Prolia x 1 dose.  Called patient and provided with phone number for [facility] Medical Day [phone]). Patient has infusion scheduled for 06/06/20 and have sent MyChart Message to have Prolia scheduled on same day for convenience  Knox Saliva, PharmD, MPH Clinical Pharmacist (Rheumatology and Pulmonology)

## 2020-05-22 NOTE — Progress Notes (Signed)
WBC count is slightly low-3.5.  Hgb and hct are borderline low but have improved.  Creatinine is borderline elevated but has improved. GFR is 52. Sodium and chloride remain low but stable.   Please forward lab work to PCP.

## 2020-05-24 LAB — COMPLETE METABOLIC PANEL WITH GFR
AG Ratio: 1.9 (calc) (ref 1.0–2.5)
ALT: 14 U/L (ref 6–29)
AST: 27 U/L (ref 10–35)
Albumin: 4.4 g/dL (ref 3.6–5.1)
Alkaline phosphatase (APISO): 37 U/L (ref 37–153)
BUN/Creatinine Ratio: 31 (calc) — ABNORMAL HIGH (ref 6–22)
BUN: 30 mg/dL — ABNORMAL HIGH (ref 7–25)
CO2: 24 mmol/L (ref 20–32)
Calcium: 9.1 mg/dL (ref 8.6–10.4)
Chloride: 97 mmol/L — ABNORMAL LOW (ref 98–110)
Creat: 0.98 mg/dL — ABNORMAL HIGH (ref 0.60–0.88)
GFR, Est African American: 60 mL/min/{1.73_m2} (ref >=60)
GFR, Est Non African American: 52 mL/min/{1.73_m2} — ABNORMAL LOW (ref >=60)
Globulin: 2.3 g/dL (calc) (ref 1.9–3.7)
Glucose, Bld: 133 mg/dL — ABNORMAL HIGH (ref 65–99)
Potassium: 4.8 mmol/L (ref 3.5–5.3)
Sodium: 131 mmol/L — ABNORMAL LOW (ref 135–146)
Total Bilirubin: 0.3 mg/dL (ref 0.2–1.2)
Total Protein: 6.7 g/dL (ref 6.1–8.1)

## 2020-05-24 LAB — CBC WITH DIFFERENTIAL/PLATELET
Absolute Monocytes: 417 cells/uL (ref 200–950)
Basophils Absolute: 39 cells/uL (ref 0–200)
Basophils Relative: 1.1 %
Eosinophils Absolute: 28 cells/uL (ref 15–500)
Eosinophils Relative: 0.8 %
HCT: 34.5 % — ABNORMAL LOW (ref 35.0–45.0)
Hemoglobin: 11.5 g/dL — ABNORMAL LOW (ref 11.7–15.5)
Lymphs Abs: 980 cells/uL (ref 850–3900)
MCH: 29.8 pg (ref 27.0–33.0)
MCHC: 33.3 g/dL (ref 32.0–36.0)
MCV: 89.4 fL (ref 80.0–100.0)
MPV: 10.9 fL (ref 7.5–12.5)
Monocytes Relative: 11.9 %
Neutro Abs: 2037 cells/uL (ref 1500–7800)
Neutrophils Relative %: 58.2 %
Platelets: 188 10*3/uL (ref 140–400)
RBC: 3.86 10*6/uL (ref 3.80–5.10)
RDW: 12.2 % (ref 11.0–15.0)
Total Lymphocyte: 28 %
WBC: 3.5 10*3/uL — ABNORMAL LOW (ref 3.8–10.8)

## 2020-05-24 LAB — VITAMIN D 1,25 DIHYDROXY
Vitamin D 1, 25 (OH)2 Total: 28 pg/mL (ref 18–72)
Vitamin D2 1, 25 (OH)2: <8 pg/mL
Vitamin D3 1, 25 (OH)2: 28 pg/mL

## 2020-05-24 NOTE — Progress Notes (Signed)
Vitamin D WNL.  Please recommend a maintenance dose of vitamin D.

## 2020-05-30 NOTE — Progress Notes (Signed)
Medical Day staff requested I reach out to pharmacy [phone] to investigate if patient can receive Prolia and Orencia on the same day. Pharmacy requested I return call in 30 min when pharmacist will be back on-site  Knox Saliva, PharmD, MPH Clinical Pharmacist (Rheumatology and Pulmonology)

## 2020-06-03 ENCOUNTER — Other Ambulatory Visit: Payer: Self-pay | Admitting: Cardiology

## 2020-06-04 NOTE — Telephone Encounter (Signed)
Rx has been sent to the pharmacy electronically. ° °

## 2020-06-05 ENCOUNTER — Ambulatory Visit (HOSPITAL_COMMUNITY)
Admission: RE | Admit: 2020-06-05 | Discharge: 2020-06-05 | Disposition: A | Discharge status: Home / Self Care | Payer: Medicare PPO | Source: Ambulatory Visit | Attending: Internal Medicine | Admitting: Internal Medicine

## 2020-06-05 ENCOUNTER — Inpatient Hospital Stay: Payer: Medicare PPO | Attending: Internal Medicine

## 2020-06-05 ENCOUNTER — Other Ambulatory Visit: Payer: Self-pay

## 2020-06-05 DIAGNOSIS — C3490 Malignant neoplasm of unspecified part of unspecified bronchus or lung: Secondary | ICD-10-CM | POA: Insufficient documentation

## 2020-06-05 DIAGNOSIS — K449 Diaphragmatic hernia without obstruction or gangrene: Secondary | ICD-10-CM | POA: Diagnosis not present

## 2020-06-05 DIAGNOSIS — Z85118 Personal history of other malignant neoplasm of bronchus and lung: Secondary | ICD-10-CM | POA: Insufficient documentation

## 2020-06-05 DIAGNOSIS — D649 Anemia, unspecified: Secondary | ICD-10-CM | POA: Insufficient documentation

## 2020-06-05 DIAGNOSIS — M47816 Spondylosis without myelopathy or radiculopathy, lumbar region: Secondary | ICD-10-CM | POA: Diagnosis not present

## 2020-06-05 DIAGNOSIS — R918 Other nonspecific abnormal finding of lung field: Secondary | ICD-10-CM | POA: Diagnosis not present

## 2020-06-05 DIAGNOSIS — I77810 Thoracic aortic ectasia: Secondary | ICD-10-CM | POA: Diagnosis not present

## 2020-06-05 DIAGNOSIS — Z902 Acquired absence of lung [part of]: Secondary | ICD-10-CM | POA: Diagnosis not present

## 2020-06-05 LAB — CMP (CANCER CENTER ONLY)
ALT: 13 U/L (ref 0–44)
AST: 26 U/L (ref 15–41)
Albumin: 4.2 g/dL (ref 3.5–5.0)
Alkaline Phosphatase: 35 U/L — ABNORMAL LOW (ref 38–126)
Anion gap: 8 (ref 5–15)
BUN: 25 mg/dL — ABNORMAL HIGH (ref 8–23)
CO2: 26 mmol/L (ref 22–32)
Calcium: 9.1 mg/dL (ref 8.9–10.3)
Chloride: 101 mmol/L (ref 98–111)
Creatinine: 1.01 mg/dL — ABNORMAL HIGH (ref 0.44–1.00)
GFR, Estimated: 54 mL/min — ABNORMAL LOW (ref >60)
Glucose, Bld: 80 mg/dL (ref 70–99)
Potassium: 4.6 mmol/L (ref 3.5–5.1)
Sodium: 135 mmol/L (ref 135–145)
Total Bilirubin: 0.3 mg/dL (ref 0.3–1.2)
Total Protein: 7.3 g/dL (ref 6.5–8.1)

## 2020-06-05 LAB — CBC WITH DIFFERENTIAL (CANCER CENTER ONLY)
Abs Immature Granulocytes: 0.03 10*3/uL (ref 0.00–0.07)
Basophils Absolute: 0 10*3/uL (ref 0.0–0.1)
Basophils Relative: 1 %
Eosinophils Absolute: 0.1 10*3/uL (ref 0.0–0.5)
Eosinophils Relative: 1 %
HCT: 36 % (ref 36.0–46.0)
Hemoglobin: 11.5 g/dL — ABNORMAL LOW (ref 12.0–15.0)
Immature Granulocytes: 1 %
Lymphocytes Relative: 26 %
Lymphs Abs: 1.2 10*3/uL (ref 0.7–4.0)
MCH: 30.1 pg (ref 26.0–34.0)
MCHC: 31.9 g/dL (ref 30.0–36.0)
MCV: 94.2 fL (ref 80.0–100.0)
Monocytes Absolute: 0.5 10*3/uL (ref 0.1–1.0)
Monocytes Relative: 11 %
Neutro Abs: 2.9 10*3/uL (ref 1.7–7.7)
Neutrophils Relative %: 60 %
Platelet Count: 182 10*3/uL (ref 150–400)
RBC: 3.82 MIL/uL — ABNORMAL LOW (ref 3.87–5.11)
RDW: 13.2 % (ref 11.5–15.5)
WBC Count: 4.8 10*3/uL (ref 4.0–10.5)
nRBC: 0 % (ref 0.0–0.2)

## 2020-06-05 MED ORDER — IOHEXOL 300 MG/ML  SOLN
75.0000 mL | Freq: Once | INTRAMUSCULAR | Status: AC | PRN
  Administered 2020-06-05: 60 mL via INTRAVENOUS

## 2020-06-06 ENCOUNTER — Ambulatory Visit (HOSPITAL_COMMUNITY)
Admission: RE | Admit: 2020-06-06 | Discharge: 2020-06-06 | Disposition: A | Discharge status: Home / Self Care | Payer: Medicare PPO | Source: Ambulatory Visit | Attending: Rheumatology | Admitting: Rheumatology

## 2020-06-06 DIAGNOSIS — M0609 Rheumatoid arthritis without rheumatoid factor, multiple sites: Secondary | ICD-10-CM | POA: Diagnosis not present

## 2020-06-06 DIAGNOSIS — M810 Age-related osteoporosis without current pathological fracture: Secondary | ICD-10-CM | POA: Diagnosis not present

## 2020-06-06 MED ORDER — DENOSUMAB 60 MG/ML ~~LOC~~ SOSY
60.0000 mg | PREFILLED_SYRINGE | Freq: Once | SUBCUTANEOUS | Status: AC
  Administered 2020-06-06: 60 mg via SUBCUTANEOUS

## 2020-06-06 MED ORDER — SODIUM CHLORIDE 0.9 % IV SOLN
500.0000 mg | INTRAVENOUS | Status: DC
  Administered 2020-06-06: 500 mg via INTRAVENOUS
  Filled 2020-06-06: qty 20

## 2020-06-06 MED ORDER — ACETAMINOPHEN 325 MG PO TABS: 650.0000 mg | ORAL_TABLET | ORAL | Status: DC

## 2020-06-06 MED ORDER — DENOSUMAB 60 MG/ML ~~LOC~~ SOSY
PREFILLED_SYRINGE | SUBCUTANEOUS | Status: AC
  Filled 2020-06-06: qty 1

## 2020-06-06 MED ORDER — DIPHENHYDRAMINE HCL 25 MG PO CAPS: 25.0000 mg | ORAL_CAPSULE | ORAL | Status: DC

## 2020-06-12 ENCOUNTER — Other Ambulatory Visit: Payer: Self-pay

## 2020-06-12 ENCOUNTER — Inpatient Hospital Stay: Payer: Medicare PPO | Admitting: Internal Medicine

## 2020-06-12 ENCOUNTER — Telehealth: Payer: Self-pay | Admitting: Internal Medicine

## 2020-06-12 VITALS — BP 153/75 | HR 76 | Temp 97.8°F | Resp 17 | Ht 60.0 in | Wt 106.6 lb

## 2020-06-12 DIAGNOSIS — Z902 Acquired absence of lung [part of]: Secondary | ICD-10-CM | POA: Diagnosis not present

## 2020-06-12 DIAGNOSIS — C3411 Malignant neoplasm of upper lobe, right bronchus or lung: Secondary | ICD-10-CM | POA: Diagnosis not present

## 2020-06-12 DIAGNOSIS — R918 Other nonspecific abnormal finding of lung field: Secondary | ICD-10-CM | POA: Diagnosis not present

## 2020-06-12 DIAGNOSIS — D638 Anemia in other chronic diseases classified elsewhere: Secondary | ICD-10-CM | POA: Diagnosis not present

## 2020-06-12 DIAGNOSIS — C3490 Malignant neoplasm of unspecified part of unspecified bronchus or lung: Secondary | ICD-10-CM | POA: Diagnosis not present

## 2020-06-12 DIAGNOSIS — D649 Anemia, unspecified: Secondary | ICD-10-CM | POA: Diagnosis not present

## 2020-06-12 DIAGNOSIS — Z85118 Personal history of other malignant neoplasm of bronchus and lung: Secondary | ICD-10-CM | POA: Diagnosis not present

## 2020-06-12 NOTE — Telephone Encounter (Signed)
Scheduled follow-up appointments per 4/26 los. Patient is aware.

## 2020-06-12 NOTE — Progress Notes (Signed)
[place] Telephone:([phone]) [phone]   Fax:(336) [phone]  OFFICE PROGRESS NOTE  Copland, Gay Filler, MD [address] [place] [address]  DIAGNOSIS: Stage IIA (T2b, N0, M0) non-small cell lung cancer, adenocarcinoma with positive EGFR mutation in exon 21 ([id number]) presented with right upper lobe lung mass diagnosed in March of 2015.  PRIOR THERAPY: Status post right upper lobectomy with wedge resection of the right middle lobe under the care of Dr. Servando Snare on 06/07/2013  CURRENT THERAPY: Observation.  INTERVAL HISTORY: [name] 87 y.o. female returns to the clinic today for follow-up visit accompanied by her daughter.  The patient is feeling fine today with no concerning complaints.  She denied having any current chest pain, shortness of breath, cough or hemoptysis.  She denied having any fever or chills.  She has no nausea, vomiting, diarrhea or constipation.  She has no headache or visual changes.  She is currently on observation.  She had repeat CT scan of the chest performed recently and she is here for evaluation and discussion of her risk her results.  MEDICAL HISTORY: Past Medical History:  Diagnosis Date  . Arthralgia of multiple joints    followed by dr Gerilyn Nestle  . Arthritis   . Cardiomyopathy ([place])   . Chronic constipation   . Chronic inflammatory arthritis    rhemotolgist-  dr a. Gerilyn Nestle [facility])  . Dry eyes    eye drops used   . GERD (gastroesophageal reflux disease)   . H/O discoid lupus erythematosus   . Hiatal hernia   . History of colon polyps   . Hypothyroidism   . Iron deficiency anemia   . LBBB (left bundle branch block) 2010  . Mitchell's disease (erythromelalgia) [facility])    neurologist-  dr patel  . Nocturia   . Non-small cell cancer of right lung [facility]) surgeon-- dr gerhardt/  oncologist-  dr Julien Nordmann--- per lov notes no recurrence/   11-18-2017 per pt denies any symptoms   dx 2015--  Stage IIA  (T2b,N0,M0) , +EGFR  mutation in exon 21, non-small cell adenocarcinoma right upper lobe---  s/p  Right upper lobectomy , right middley wedge resection and node dissection---  no chemo or radiation therapy  . OA (osteoarthritis)    hands  . Osteoporosis   . PONV (postoperative nausea and vomiting)    likes phenergan  . Raynaud's phenomenon 1965  . Renal insufficiency   . Rheumatoid arthritis ([place])   . Sciatica   . Scoliosis   . Sjogren's syndrome ([place])     ALLERGIES:  is allergic to amlodipine, prochlorperazine edisylate, aspirin, cymbalta [duloxetine hcl], and pamelor [nortriptyline hcl].  MEDICATIONS:  Current Outpatient Medications  Medication Sig Dispense Refill  . Abatacept (ORENCIA IV) Inject into the vein every 30 (thirty) days.    . Acetaminophen (TYLENOL EXTRA STRENGTH PO) Take 1-2 tablets by mouth every 6 (six) hours as needed (pain).     . Acetaminophen-Codeine 300-30 MG tablet TAKE 1-2 TABLETS BY MOUTH EVERY 8 (EIGHT) HOURS AS NEEDED FOR MODERATE PAIN. 30 tablet 2  . Biotin 1000 MCG tablet Take 1,000 mcg by mouth daily.    . Calcium Carbonate-Vitamin D 500-125 MG-UNIT TABS Take 1 tablet by mouth daily.    . carvedilol (COREG) 3.125 MG tablet TAKE 1 TABLET (3.125 MG TOTAL) BY MOUTH 2 (TWO) TIMES DAILY. 180 tablet 1  . denosumab (PROLIA) 60 MG/ML SOSY injection Inject 60 mg into the skin  every 6 (six) months. Last injection 04-22-2019    . docusate sodium (COLACE) 100 MG capsule Take 100 mg by mouth 2 (two) times daily.    Marland Kitchen gabapentin (NEURONTIN) 300 MG capsule TAKE 1 CAPSULE BY MOUTH IN THE MORNING, 1 IN THE AFTERNOON, AND 2 AT BEDTIME 360 capsule 1  . Glucosamine-Chondroit-Vit C-Mn (GLUCOSAMINE CHONDR 1500 COMPLX PO) Take 1 capsule by mouth daily.    Marland Kitchen levothyroxine (EUTHYROX) 75 MCG tablet TAKE 1 TABLET BY MOUTH ONCE DAILY BEFORE BREAKFAST 90 tablet 1  . methocarbamol (ROBAXIN) 500 MG tablet TAKE 1 TABLET (500 MG TOTAL) BY MOUTH EVERY 6 (SIX) HOURS AS NEEDED FOR MUSCLE  SPASMS. 40 tablet 0  . Multiple Vitamin (MULTIVITAMIN) tablet Take 1 tablet by mouth daily.    Marland Kitchen omeprazole (PRILOSEC) 20 MG capsule Take 1 capsule (20 mg total) by mouth daily. 180 capsule 1  . Probiotic Product (PROBIOTIC PO) Take 1 capsule by mouth daily.     Marland Kitchen sulfamethoxazole-trimethoprim (BACTRIM DS) 800-160 MG tablet Take 1 tablet by mouth 2 (two) times daily. 10 tablet 0   No current facility-administered medications for this visit.    SURGICAL HISTORY:  Past Surgical History:  Procedure Laterality Date  . ANTERIOR HIP REVISION Right 11/27/2017   Procedure: RIGHT HIP ACETABULAR REVISION;  Surgeon: [name], MD;  Location: [facility];  Service: Orthopedics;  Laterality: Right;  . ANTERIOR HIP REVISION Right 01/24/2018   Procedure: OPEN REDUCTION OF DISLOCATED ANTERIOR HIP WITH REVISION OF LINER AND HIP BALL;  Surgeon: [name], MD;  Location: [facility];  Service: Orthopedics;  Laterality: Right;  . APPENDECTOMY  1950s  . BIOPSY  04/14/2018   Procedure: BIOPSY;  Surgeon: Yetta Flock, MD;  Location: [place];  Service: Gastroenterology;;  . BIOPSY  04/16/2018   Procedure: BIOPSY;  Surgeon: Irving Copas., MD;  Location: [facility];  Service: Gastroenterology;;  . CARDIOVASCULAR STRESS TEST  12/2008    mild fixed basal to mid septal perfusion defect felt likely due to artifact from LBBB, no ischemia, EF 58%  . COLONOSCOPY    . COLONOSCOPY WITH PROPOFOL N/A 04/16/2018   Procedure: COLONOSCOPY WITH PROPOFOL;  Surgeon: Rush Landmark Telford Nab., MD;  Location: [place];  Service: Gastroenterology;  Laterality: N/A;  . ESOPHAGOGASTRODUODENOSCOPY (EGD) WITH PROPOFOL N/A 04/14/2018   Procedure: ESOPHAGOGASTRODUODENOSCOPY (EGD) WITH PROPOFOL;  Surgeon: Yetta Flock, MD;  Location: [place];  Service: Gastroenterology;  Laterality: N/A;  . FEMORAL-POPLITEAL BYPASS GRAFT Right 04/10/2018   Procedure: REPAIR RIGHT FEMORAL ARTERY  PSEUDOANEURYSM, RETROPERITONEAL EXPOSURE OF ILIAC ARTERY, RIGHT POPLITEAL EMBOLECTOMY;  Surgeon: Angelia Mould, MD;  Location: [place];  Service: Vascular;  Laterality: Right;  . FLEXIBLE SIGMOIDOSCOPY N/A 06/17/2019   Procedure: FLEXIBLE SIGMOIDOSCOPY;  Surgeon: Lavena Bullion, DO;  [facility];  Service: Gastroenterology;  Laterality: N/A;  . HEMOSTASIS CLIP PLACEMENT  06/17/2019   Procedure: HEMOSTASIS CLIP PLACEMENT;  Surgeon: Lavena Bullion, DO;  [facility];  Service: Gastroenterology;;  . LYMPH NODE DISSECTION Right 06/07/2013   Procedure: LYMPH NODE DISSECTION;  Surgeon: [name], MD;  Location: [place];  Service: Thoracic;  Laterality: Right;  . PATCH ANGIOPLASTY Right 04/10/2018   Procedure: PATCH  ANGIOPLASTY OF RIGHT FEMORAL ARTERY USING BOVINE PATCH, PATCH ANGIOPLASTY OF RIGHT POPLITEAL ARTERY USING BOVINE PATCH;  Surgeon: Angelia Mould, MD;  Location: [place];  Service: Vascular;  Laterality: Right;  . SCHLEROTHERAPY  06/17/2019   Procedure: SCHLEROTHERAPY OF VARICES;  Surgeon: Bryan Lemma,  Dominic Pea, DO;  Location[facility];  Service: Gastroenterology;;  . [place]   "large incision from chest to up to shoulder, the nerves were tied together, for raynaud's  . THORACOTOMY  06/07/2013   Procedure: MINI/LIMITED THORACOTOMY; right middle lobe wedge resection;  Surgeon: [name], MD;  Location: [place];  Service: Thoracic;;  . TONSILLECTOMY  child  . TOTAL ABDOMINAL HYSTERECTOMY  1980's    W/ BSO  . TOTAL HIP ARTHROPLASTY Right 04/28/2014   Procedure: RIGHT TOTAL HIP ARTHROPLASTY ANTERIOR APPROACH;  Surgeon: [name], MD;  Location: [facility];  Service: Orthopedics;  Laterality: Right;  . TRANSTHORACIC ECHOCARDIOGRAM  12/11/2008   ef 46-80%, grade 1 diastolic dysfunction/  mild LAE/  mild AR and MR/  trivial TR  . VIDEO ASSISTED THORACOSCOPY (VATS)/WEDGE RESECTION Right 06/07/2013   Procedure: VIDEO  ASSISTED THORACOSCOPY (VATS)/right upper lobectomy, On Q;  Surgeon: [name], MD;  Location: [place];  Service: Thoracic;  Laterality: Right;  Marland Kitchen VIDEO BRONCHOSCOPY N/A 06/07/2013   Procedure: VIDEO BRONCHOSCOPY;  Surgeon: [name], MD;  [facility] OR;  Service: Thoracic;  Laterality: N/A;  . VIDEO BRONCHOSCOPY WITH ENDOBRONCHIAL NAVIGATION N/A 05/04/2013   Procedure: VIDEO BRONCHOSCOPY WITH ENDOBRONCHIAL NAVIGATION;  Surgeon: [name], MD;  Location: [place];  Service: Thoracic;  Laterality: N/A;    REVIEW OF SYSTEMS:  Constitutional: positive for fatigue Eyes: negative Ears, nose, mouth, throat, and face: negative Respiratory: negative Cardiovascular: negative Gastrointestinal: negative Genitourinary:negative Integument/breast: negative Hematologic/lymphatic: negative Musculoskeletal:negative Neurological: negative Behavioral/Psych: negative Endocrine: negative Allergic/Immunologic: negative   PHYSICAL EXAMINATION: General appearance: alert, cooperative and no distress Head: Normocephalic, without obvious abnormality, atraumatic Neck: no adenopathy, no JVD, supple, symmetrical, trachea midline and thyroid not enlarged, symmetric, no tenderness/mass/nodules Lymph nodes: Cervical, supraclavicular, and axillary nodes normal. Resp: clear to auscultation bilaterally Back: symmetric, no curvature. ROM normal. No CVA tenderness. Cardio: regular rate and rhythm, S1, S2 normal, no murmur, click, rub or gallop GI: soft, non-tender; bowel sounds normal; no masses,  no organomegaly Extremities: extremities normal, atraumatic, no cyanosis or edema Neurologic: Alert and oriented X 3, normal strength and tone. Normal symmetric reflexes. Normal coordination and gait  ECOG PERFORMANCE STATUS: 1 - Symptomatic but completely ambulatory  Blood pressure (!) 153/75, pulse 76, temperature 97.8 F (36.6 C), temperature source Tympanic, resp. rate 17, height 5' (1.524 m), weight  106 lb 9.6 oz (48.4 kg), SpO2 99 %.  LABORATORY DATA: Lab Results  Component Value Date   WBC 4.8 06/05/2020   HGB 11.5 (L) 06/05/2020   HCT 36.0 06/05/2020   MCV 94.2 06/05/2020   PLT 182 06/05/2020      Chemistry      Component Value Date/Time   NA 135 06/05/2020 1023   NA 130 (L) 08/21/2016 1125   K 4.6 06/05/2020 1023   K 4.8 08/21/2016 1125   CL 101 06/05/2020 1023   CO2 26 06/05/2020 1023   CO2 26 08/21/2016 1125   BUN 25 (H) 06/05/2020 1023   BUN 25.6 08/21/2016 1125   CREATININE 1.01 (H) 06/05/2020 1023   CREATININE 0.98 (H) 05/21/2020 1341   CREATININE 1.2 (H) 08/21/2016 1125      Component Value Date/Time   CALCIUM 9.1 06/05/2020 1023   CALCIUM 10.0 08/21/2016 1125   ALKPHOS 35 (L) 06/05/2020 1023   ALKPHOS 54 08/21/2016 1125   AST 26 06/05/2020 1023   AST 27 08/21/2016 1125   ALT 13 06/05/2020 1023   ALT  13 08/21/2016 1125   BILITOT 0.3 06/05/2020 1023   BILITOT 0.31 08/21/2016 1125       RADIOGRAPHIC STUDIES: CT Chest W Contrast  Result Date: 06/05/2020 CLINICAL DATA:  Non-small cell lung cancer, new pulmonary nodules on previous exam. Follow-up assessment. EXAM: CT CHEST WITH CONTRAST TECHNIQUE: Multidetector CT imaging of the chest was performed during intravenous contrast administration. CONTRAST:  27mL OMNIPAQUE IOHEXOL 300 MG/ML  SOLN COMPARISON:  Multiple prior studies most recent January of 2021 and January 2022. FINDINGS: Cardiovascular: Mildly dilated ascending thoracic aorta similar to prior imaging. Normal heart size. No pericardial effusion. Central pulmonary vasculature is normal caliber. Mediastinum/Nodes: Moderate to large hiatal hernia approximately 50% of the stomach herniated into the chest. Mild thickening of the esophagus. No thoracic inlet adenopathy. No axillary lymphadenopathy. No mediastinal adenopathy. No hilar adenopathy. Lungs/Pleura: Numerous small pulmonary nodules. Sub solid nodule in the RIGHT lower lobe at 9 mm (image 98,  series 5 and 97, series 5) previously approximately 8 mm. 5 mm nodule in the RIGHT lower lobe on image 86 of series 5 within 1 mm of the prior study. Other millimetric nodules persists. Sub solid area in the RIGHT lower lobe on image 64 of series 5 stable at approximately 9 mm. RIGHT middle lobe with small nodules which are unchanged as well. LEFT lower lobe nodule (image 101, series 5) 5 mm. Unchanged. Adjacent small nodules also stable. Post partial lung resection, RIGHT upper lobectomy, in the RIGHT chest as before. Upper Abdomen: Incidental imaging of upper abdominal contents is unremarkable. Small hypervascular focus in the RIGHT hepatic lobe stable since 2021 and likely a small flash fill hemangioma or similar benign finding (image 116, series 2. Imaged portions of pancreas and spleen are unremarkable. The adrenal glands are normal. Ptotic RIGHT kidney, riding low in the abdomen due to spinal curvature. Renal cortical scarring and extrarenal pelvis on the LEFT with increased fullness of the extrarenal pelvis up to 2 cm on today's study as compared to 1.4 cm on previous exam. No upper abdominal lymphadenopathy. Musculoskeletal: Spinal degenerative changes with dextroconvex curvature of the lumbar spine partially imaged, marked curvature in the setting of degenerative changes showing a similar appearance to prior imaging. IMPRESSION: 1. Numerous small bilateral pulmonary nodules are within 1 mm size of the previous study; however, these remain suspicious given patient history and development since January of 2021. 2. Moderate to large hiatal hernia with approximately 50% of the stomach herniated into the chest. Mild thickening of the esophagus. Correlate clinically for any signs of esophagitis. 3. Renal cortical scarring and extrarenal pelvis on the LEFT with increased fullness of the extrarenal pelvis up to 2 cm on today's study as compared to 1.4 cm on previous exam. Suggestion of mild ureteral distension as  well without frank hydronephrosis. Consider follow-up renal sonogram pre and postvoid and correlation with any symptoms of LEFT flank pain. 4. Small hypervascular focus in the RIGHT hepatic lobe stable since 2021 and likely a small flash fill hemangioma or similar benign finding. 5. Spinal degenerative changes and dextroconvex curvature of the lumbar spine partially imaged, marked curvature in the setting of degenerative changes. 6. Aortic atherosclerosis. Aortic Atherosclerosis (ICD10-I70.0). Electronically Signed   By: Zetta Bills M.D.   On: 06/05/2020 17:06   ASSESSMENT AND PLAN:  This is a very pleasant 87 years old white female with stage IIA non-small cell lung cancer, adenocarcinoma with positive EGFR mutation in exon 21 status post right upper lobectomy as well as wedge resection  of the right middle lobe in March 2015 and the patient declined adjuvant systemic chemotherapy. The patient has been on observation since that time and she is feeling fine today with no concerning complaints. She had repeat CT scan of the chest performed recently.  I personally and independently reviewed the scan images and discussed the results with the patient and her daughter. Her scan showed numerous small bilateral pulmonary nodules that did not significantly change in size except by around 1 mm in size compared to the previous scan 3 months ago. I had a lengthy discussion with the patient and her daughter about her current condition and treatment options. I gave her the option of continuous observation and monitoring versus consideration of treatment with targeted therapy with Tagrisso because of the EGFR mutation and the highly suspicious disease recurrence. The patient would like to continue on observation for now. I will see her back for follow-up visit in 4 months for evaluation with repeat CT scan of the chest. For the anemia, she will continue with the oral iron tablet. The patient was advised to call  immediately if she has any concerning symptoms in the interval. The patient voices understanding of current disease status and treatment options and is in agreement with the current care plan. All questions were answered. The patient knows to call the clinic with any problems, questions or concerns. We can certainly see the patient much sooner if necessary.  Disclaimer: This note was dictated with voice recognition software. Similar sounding words can inadvertently be transcribed and may not be corrected upon review.

## 2020-06-15 ENCOUNTER — Other Ambulatory Visit: Payer: Self-pay | Admitting: Pharmacist

## 2020-06-15 DIAGNOSIS — M0609 Rheumatoid arthritis without rheumatoid factor, multiple sites: Secondary | ICD-10-CM

## 2020-06-15 DIAGNOSIS — M810 Age-related osteoporosis without current pathological fracture: Secondary | ICD-10-CM

## 2020-06-15 DIAGNOSIS — Z79899 Other long term (current) drug therapy: Secondary | ICD-10-CM

## 2020-06-15 NOTE — Progress Notes (Signed)
Next infusion scheduled for Orencia on 07/04/20 and due for updated orders. Diagnosis: M06.09 (RA)  Dose: Orencia 500mg  every 28 days  Last Clinic Visit: 03/15/20 Next Clinic Visit: 08/14/20  Last infusion: 06/06/20 Labs: 06/05/20. Creatinine is elevated but stable. Anemia persists. WBC improved. TB Gold: due May 2022  Orders placed for Orencia 500mg  x 3 doses along with premedication of Tylenol and Benadryl to be administered 30 minutes before medication infusion.  Standing CBC with diff/platelet and CMP with GFR orders placed to be drawn every 2 months.  TB gold due with May 2022 infusion (order placed)  Called patient and provided with phone number for [facility] [phone])  Knox Saliva, PharmD, MPH Clinical Pharmacist (Rheumatology and Pulmonology)

## 2020-06-19 ENCOUNTER — Encounter: Payer: Self-pay | Admitting: Family Medicine

## 2020-06-20 ENCOUNTER — Encounter: Payer: Self-pay | Admitting: Family Medicine

## 2020-06-20 ENCOUNTER — Ambulatory Visit: Payer: Medicare PPO | Admitting: Family Medicine

## 2020-06-20 ENCOUNTER — Other Ambulatory Visit: Payer: Self-pay

## 2020-06-20 VITALS — BP 138/60 | HR 83 | Temp 99.1°F | Ht 60.0 in | Wt 102.2 lb

## 2020-06-20 DIAGNOSIS — J40 Bronchitis, not specified as acute or chronic: Secondary | ICD-10-CM

## 2020-06-20 DIAGNOSIS — J0110 Acute frontal sinusitis, unspecified: Secondary | ICD-10-CM

## 2020-06-20 MED ORDER — ALBUTEROL SULFATE HFA 108 (90 BASE) MCG/ACT IN AERS
2.0000 | INHALATION_SPRAY | Freq: Four times a day (QID) | RESPIRATORY_TRACT | 0 refills | Status: DC | PRN
Start: 2020-06-20 — End: 2020-07-12

## 2020-06-20 MED ORDER — AMOXICILLIN-POT CLAVULANATE 875-125 MG PO TABS
1.0000 | ORAL_TABLET | Freq: Two times a day (BID) | ORAL | 0 refills | Status: DC
Start: 2020-06-20 — End: 2020-07-30

## 2020-06-20 NOTE — Progress Notes (Signed)
[facility] [address], [address] [place], [place] [address] [phone]  Date:  06/20/2020   Name:  [name]   DOB:  [birth year]   MRN:  [id number]  PCP:  Raven Cables, MD    Chief Complaint: Fatigue (Head is feeling full. Started last Thursday )   History of Present Illness:  [name] is a 87 y.o. 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 last Thursday, I started with a rather bad case of laryngitis (for two days) and then  changed to what appeared to be a head cold and eventually more of a head/chest cold. I am rather miserable!!  I have used all of the usual "home remedies" and I am now wondering (after 6 days) if it could be a sinus infection. My daughter has administered two covid tests to me over those days and both were negative and my temperature is normal. Do you think that I should be taking an antibiotic or come in to see you?I was at [facility] and the [facility] a few days before I came down with it so I could have picked up a "bug"!    She has had 3 doses of covid 19 vaccine Last seen by myself about 6 months ago   Sx started 7 days ago- laryngitis, then cold sx She feels pressure in her sinuses, and some headache This am early she woke up with mucus in her throat that nearly choked her.  She was able to cough this up No fever noted - she has checked, and was tested for covid twice- most recent yesterday She will have a tickle in her throat and may cough No GI symptoms   Patient Active Problem List   Diagnosis Date Noted  . Acute posthemorrhagic anemia   . Rectal bleed  06/17/2019  . Grade II internal hemorrhoids   . Rectal ulcer   . Popliteal artery occlusion, 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/10/2018  . HTN (hypertension) 04/10/2018  . Femoral artery pseudo-aneurysm, 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/09/2018  . Anemia of chronic disease 02/24/2018  . Unstable right hip arthroplasty 01/24/2018  . History of revision of total replacement of right hip joint 01/24/2018  . Hypovolemic shock (HCC)   . Hyperkalemia   . Hyponatremia   . 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  . Status post revision of total hip 11/27/2017  . Elevated cholesterol 10/11/2015  . 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HCC) 10/04/2014  . Bilateral leg edema 08/01/2014  . Elevated BP 08/01/2014  . Rheumatoid arthritis involving multiple joints (HCC) 05/30/2014  . Status post total replacement of right hip 04/28/2014  . Long-term use of high-risk medication 11/11/2013  . Symptomatic anemia 11/11/2013  . Neuropathic pain 07/07/2013  . Constipation due to pain medication 07/07/2013  . Protein-calorie malnutrition, severe (HCC) 06/08/2013  . Lung cancer, Right upper lobe 05/08/2013  . Sciatica of right side 08/13/2011  . Osteoporosis 03/07/2010  . PARESTHESIA 03/07/2010  . CT, CHEST, ABNORMAL 12/18/2008  . ABNORMAL ECHOCARDIOGRAM 12/14/2008  . SJOGREN'S SYNDROME 11/29/2008  . HYPOGLYCEMIA 06/29/2006  . RAYNAUD'S DISEASE 06/29/2006    Past Medical History:  Diagnosis Date  . Arthralgia of multiple joints  followed by dr Gerilyn Nestle  . Arthritis   . Cardiomyopathy ([place])   . Chronic constipation   . Chronic inflammatory arthritis    rhemotolgist-  dr a. Gerilyn Nestle [facility])  . Dry eyes    eye drops used   . GERD (gastroesophageal reflux disease)   . H/O discoid lupus erythematosus   . Hiatal hernia   . History of colon polyps   . Hypothyroidism   . Iron deficiency anemia   . LBBB (left bundle branch block) 2010  . Mitchell's disease (erythromelalgia) [facility])    neurologist-  dr patel  . Nocturia    . Non-small cell cancer of right lung [facility]) surgeon-- dr gerhardt/  oncologist-  dr Julien Nordmann--- per lov notes no recurrence/   11-18-2017 per pt denies any symptoms   dx 2015--  Stage IIA (T2b,N0,M0) , +EGFR  mutation in exon 21, non-small cell adenocarcinoma right upper lobe---  s/p  Right upper lobectomy , right middley wedge resection and node dissection---  no chemo or radiation therapy  . OA (osteoarthritis)    hands  . Osteoporosis   . PONV (postoperative nausea and vomiting)    likes phenergan  . Raynaud's phenomenon 1965  . Renal insufficiency   . Rheumatoid arthritis ([place])   . Sciatica   . Scoliosis   . Sjogren's syndrome [facility])     Past Surgical History:  Procedure Laterality Date  . ANTERIOR HIP REVISION Right 11/27/2017   Procedure: RIGHT HIP ACETABULAR REVISION;  Surgeon: [name], MD;  Location: [facility];  Service: Orthopedics;  Laterality: Right;  . ANTERIOR HIP REVISION Right 01/24/2018   Procedure: OPEN REDUCTION OF DISLOCATED ANTERIOR HIP WITH REVISION OF LINER AND HIP BALL;  Surgeon: [name], MD;  Location: [facility];  Service: Orthopedics;  Laterality: Right;  . APPENDECTOMY  1950s  . BIOPSY  04/14/2018   Procedure: BIOPSY;  Surgeon: Yetta Flock, MD;  Location: [place];  Service: Gastroenterology;;  . BIOPSY  04/16/2018   Procedure: BIOPSY;  Surgeon: Irving Copas., MD;  Location: [facility];  Service: Gastroenterology;;  . CARDIOVASCULAR STRESS TEST  12/2008    mild fixed basal to mid septal perfusion defect felt likely due to artifact from LBBB, no ischemia, EF 58%  . COLONOSCOPY    . COLONOSCOPY WITH PROPOFOL N/A 04/16/2018   Procedure: COLONOSCOPY WITH PROPOFOL;  Surgeon: Rush Landmark Telford Nab., MD;  Location: [place];  Service: Gastroenterology;  Laterality: N/A;  . ESOPHAGOGASTRODUODENOSCOPY (EGD) WITH PROPOFOL N/A 04/14/2018   Procedure: ESOPHAGOGASTRODUODENOSCOPY (EGD) WITH PROPOFOL;  Surgeon: Yetta Flock, MD;  Location: [place];  Service: Gastroenterology;  Laterality: N/A;  . FEMORAL-POPLITEAL BYPASS GRAFT Right 04/10/2018   Procedure: REPAIR RIGHT FEMORAL ARTERY PSEUDOANEURYSM, RETROPERITONEAL EXPOSURE OF ILIAC ARTERY, RIGHT POPLITEAL EMBOLECTOMY;  Surgeon: Angelia Mould, MD;  Location: [place];  Service: Vascular;  Laterality: Right;  . FLEXIBLE SIGMOIDOSCOPY N/A 06/17/2019   Procedure: FLEXIBLE SIGMOIDOSCOPY;  Surgeon: Lavena Bullion, DO;  [facility];  Service: Gastroenterology;  Laterality: N/A;  . HEMOSTASIS CLIP PLACEMENT  06/17/2019   Procedure: HEMOSTASIS CLIP PLACEMENT;  Surgeon: Lavena Bullion, DO;  [facility];  Service: Gastroenterology;;  . LYMPH NODE DISSECTION Right 06/07/2013   Procedure: LYMPH NODE DISSECTION;  Surgeon: [name], MD;  Location: [place];  Service: Thoracic;  Laterality: Right;  . PATCH ANGIOPLASTY Right 04/10/2018   Procedure: PATCH  ANGIOPLASTY OF RIGHT FEMORAL ARTERY USING BOVINE PATCH, PATCH ANGIOPLASTY OF RIGHT POPLITEAL  ARTERY USING BOVINE PATCH;  Surgeon: Angelia Mould, MD;  Location: [place];  Service: Vascular;  Laterality: Right;  . SCHLEROTHERAPY  06/17/2019   Procedure: SCHLEROTHERAPY OF VARICES;  Surgeon: Lavena Bullion, DO;  [facility];  Service: Gastroenterology;;  . [place]   "large incision from chest to up to shoulder, the nerves were tied together, for raynaud's  . THORACOTOMY  06/07/2013   Procedure: MINI/LIMITED THORACOTOMY; right middle lobe wedge resection;  Surgeon: [name], MD;  Location: [place];  Service: Thoracic;;  . TONSILLECTOMY  child  . TOTAL ABDOMINAL HYSTERECTOMY  1980's    W/ BSO  . TOTAL HIP ARTHROPLASTY Right 04/28/2014   Procedure: RIGHT TOTAL HIP ARTHROPLASTY ANTERIOR APPROACH;  Surgeon: [name], MD;  [facility];  Service: Orthopedics;  Laterality: Right;  . TRANSTHORACIC ECHOCARDIOGRAM  12/11/2008    ef 16-60%, grade 1 diastolic dysfunction/  mild LAE/  mild AR and MR/  trivial TR  . VIDEO ASSISTED THORACOSCOPY (VATS)/WEDGE RESECTION Right 06/07/2013   Procedure: VIDEO ASSISTED THORACOSCOPY (VATS)/right upper lobectomy, On Q;  Surgeon: [name], MD;  Location: [place];  Service: Thoracic;  Laterality: Right;  Marland Kitchen VIDEO BRONCHOSCOPY N/A 06/07/2013   Procedure: VIDEO BRONCHOSCOPY;  Surgeon: [name], MD;  Location: [facility] OR;  Service: Thoracic;  Laterality: N/A;  . VIDEO BRONCHOSCOPY WITH ENDOBRONCHIAL NAVIGATION N/A 05/04/2013   Procedure: VIDEO BRONCHOSCOPY WITH ENDOBRONCHIAL NAVIGATION;  Surgeon: [name], MD;  Location: [place];  Service: Thoracic;  Laterality: N/A;    Social History   Tobacco Use  . Smoking status: Never Smoker  . Smokeless tobacco: Never Used  Vaping Use  . Vaping Use: Never used  Substance Use Topics  . Alcohol use: Not Currently  . Drug use: Never    Family History  Problem Relation Age of Onset  . Coronary artery disease Father   . Colon cancer Father   . Diabetes Father   . Cancer Father        colon  . Other Mother 32       MVA  . Healthy Sister   . Healthy Brother   . Healthy Daughter   . Hypothyroidism Daughter   . Other Brother        killed in war  . Pneumonia Sister   . Healthy Daughter   . Esophageal cancer Neg Hx   . Kidney disease Neg Hx   . Liver disease Neg Hx     Allergies  Allergen Reactions  . Amlodipine Rash  . Prochlorperazine Edisylate Anaphylaxis    Compazine--- tongue swells and rash  . Aspirin Other (See Comments)    nose bleeds. Cannot take NSAIDS   . Cymbalta [Duloxetine Hcl] Diarrhea, Nausea And Vomiting and Other (See Comments)    Increased blood pressure  . Pamelor [Nortriptyline Hcl] Diarrhea and Nausea Only    Increased Heart rate and BP    Medication list has been reviewed and updated.  Current Outpatient Medications on File Prior to Visit  Medication Sig Dispense Refill  . Abatacept  (ORENCIA IV) Inject into the vein every 30 (thirty) days.    . Acetaminophen (TYLENOL EXTRA STRENGTH PO) Take 1-2 tablets by mouth every 6 (six) hours as needed (pain).     . Acetaminophen-Codeine 300-30 MG tablet TAKE 1-2 TABLETS BY MOUTH EVERY 8 (EIGHT) HOURS AS NEEDED FOR MODERATE PAIN. 30 tablet 2  . Biotin 1000 MCG tablet Take 1,000 mcg by mouth  daily.    . Calcium Carbonate-Vitamin D 500-125 MG-UNIT TABS Take 1 tablet by mouth daily.    . carvedilol (COREG) 3.125 MG tablet TAKE 1 TABLET (3.125 MG TOTAL) BY MOUTH 2 (TWO) TIMES DAILY. 180 tablet 1  . denosumab (PROLIA) 60 MG/ML SOSY injection Inject 60 mg into the skin every 6 (six) months. Last injection 04-22-2019    . docusate sodium (COLACE) 100 MG capsule Take 100 mg by mouth 2 (two) times daily.    Marland Kitchen gabapentin (NEURONTIN) 300 MG capsule TAKE 1 CAPSULE BY MOUTH IN THE MORNING, 1 IN THE AFTERNOON, AND 2 AT BEDTIME 360 capsule 1  . Glucosamine-Chondroit-Vit C-Mn (GLUCOSAMINE CHONDR 1500 COMPLX PO) Take 1 capsule by mouth daily.    Marland Kitchen levothyroxine (EUTHYROX) 75 MCG tablet TAKE 1 TABLET BY MOUTH ONCE DAILY BEFORE BREAKFAST 90 tablet 1  . methocarbamol (ROBAXIN) 500 MG tablet TAKE 1 TABLET (500 MG TOTAL) BY MOUTH EVERY 6 (SIX) HOURS AS NEEDED FOR MUSCLE SPASMS. 40 tablet 0  . Multiple Vitamin (MULTIVITAMIN) tablet Take 1 tablet by mouth daily.    Marland Kitchen omeprazole (PRILOSEC) 20 MG capsule Take 1 capsule (20 mg total) by mouth daily. 180 capsule 1  . Probiotic Product (PROBIOTIC PO) Take 1 capsule by mouth daily.     Marland Kitchen sulfamethoxazole-trimethoprim (BACTRIM DS) 800-160 MG tablet Take 1 tablet by mouth 2 (two) times daily. 10 tablet 0   No current facility-administered medications on file prior to visit.    Review of Systems:  As per HPI- otherwise negative.   Physical Examination: Vitals:   06/20/20 1148  BP: 138/60  Pulse: 83  Temp: 99.1 F (37.3 C)  SpO2: 96%   Vitals:   06/20/20 1148  Weight: 102 lb 3.2 oz (46.4 kg)  Height: 5'  (1.524 m)   Body mass index is 19.96 kg/m. Ideal Body Weight: Weight in (lb) to have BMI = 25: 127.7  GEN: no acute distress.  Petite build, looks well and her normal self.  Voice is a bit hoarse HEENT: Atraumatic, Normocephalic. Bilateral TM wnl, oropharynx erythematous but no exudate.  PEERL,EOMI.   Ears and Nose: No external deformity. CV: RRR, No M/G/R. No JVD. No thrill. No extra heart sounds. PULM: CTA B, no wheezes, crackles, rhonchi. No retractions. No resp. distress. No accessory muscle use. EXTR: No c/c/e PSYCH: Normally interactive. Conversant.  Normal gait for age, uses cane   Assessment and Plan: Acute non-recurrent frontal sinusitis - Plan: amoxicillin-clavulanate (AUGMENTIN) 875-125 MG tablet  Bronchitis - Plan: albuterol (VENTOLIN HFA) 108 (90 Base) MCG/ACT inhaler  Seen today for concern of illness.  Symptoms consistent with bronchitis and/or acute sinusitis We will treat with Augmentin for 10 days.  She is also tried albuterol inhaler, however her current device is expired.  Gave her a new prescription  She will contact me if not feeling better in the next few days, sooner if worse This visit occurred during the SARS-CoV-2 public health emergency.  Safety protocols were in place, including screening questions prior to the visit, additional usage of staff PPE, and extensive cleaning of exam room while observing appropriate contact time as indicated for disinfecting solutions.    Signed Lamar Blinks, MD

## 2020-06-20 NOTE — Patient Instructions (Addendum)
Good to see you again today! We will treat you with augmentin antibiotic and inhaler as needed.  Please let me know if you are not feeling better in the next few days- Sooner if worse.  mucinex is also ok to use

## 2020-07-04 ENCOUNTER — Other Ambulatory Visit: Payer: Self-pay

## 2020-07-04 ENCOUNTER — Encounter (HOSPITAL_COMMUNITY)
Admission: RE | Admit: 2020-07-04 | Discharge: 2020-07-04 | Disposition: A | Discharge status: Home / Self Care | Payer: Medicare PPO | Source: Ambulatory Visit | Attending: Rheumatology | Admitting: Rheumatology

## 2020-07-04 DIAGNOSIS — Z79899 Other long term (current) drug therapy: Secondary | ICD-10-CM | POA: Insufficient documentation

## 2020-07-04 DIAGNOSIS — M0609 Rheumatoid arthritis without rheumatoid factor, multiple sites: Secondary | ICD-10-CM | POA: Diagnosis not present

## 2020-07-04 MED ORDER — DIPHENHYDRAMINE HCL 25 MG PO CAPS: 25.0000 mg | ORAL_CAPSULE | ORAL | Status: DC

## 2020-07-04 MED ORDER — SODIUM CHLORIDE 0.9 % IV SOLN
500.0000 mg | INTRAVENOUS | Status: DC
  Administered 2020-07-04: 500 mg via INTRAVENOUS
  Filled 2020-07-04 (×2): qty 20

## 2020-07-04 MED ORDER — ACETAMINOPHEN 325 MG PO TABS: 650.0000 mg | ORAL_TABLET | ORAL | Status: DC

## 2020-07-08 LAB — QUANTIFERON-TB GOLD PLUS: QuantiFERON-TB Gold Plus: NEGATIVE

## 2020-07-08 LAB — QUANTIFERON-TB GOLD PLUS (RQFGPL)
QuantiFERON Mitogen Value: >10 IU/mL
QuantiFERON Nil Value: 0.01 IU/mL
QuantiFERON TB1 Ag Value: 0.07 IU/mL
QuantiFERON TB2 Ag Value: 0.04 IU/mL

## 2020-07-08 NOTE — Progress Notes (Signed)
TB Gold is negative.

## 2020-07-09 ENCOUNTER — Other Ambulatory Visit: Payer: Self-pay | Admitting: Orthopaedic Surgery

## 2020-07-12 ENCOUNTER — Other Ambulatory Visit: Payer: Self-pay | Admitting: Family Medicine

## 2020-07-12 DIAGNOSIS — J40 Bronchitis, not specified as acute or chronic: Secondary | ICD-10-CM

## 2020-07-27 ENCOUNTER — Encounter: Payer: Self-pay | Admitting: Family Medicine

## 2020-07-30 ENCOUNTER — Encounter: Payer: Self-pay | Admitting: Family Medicine

## 2020-07-30 ENCOUNTER — Ambulatory Visit: Payer: Medicare PPO | Admitting: Family Medicine

## 2020-07-30 ENCOUNTER — Other Ambulatory Visit: Payer: Self-pay

## 2020-07-30 VITALS — BP 136/72 | HR 78 | Temp 97.1°F | Resp 18 | Ht 60.0 in | Wt 107.0 lb

## 2020-07-30 DIAGNOSIS — R197 Diarrhea, unspecified: Secondary | ICD-10-CM

## 2020-07-30 DIAGNOSIS — E039 Hypothyroidism, unspecified: Secondary | ICD-10-CM

## 2020-07-30 DIAGNOSIS — R5381 Other malaise: Secondary | ICD-10-CM

## 2020-07-30 LAB — COMPREHENSIVE METABOLIC PANEL
ALT: 11 U/L (ref 0–35)
AST: 21 U/L (ref 0–37)
Albumin: 4.2 g/dL (ref 3.5–5.2)
Alkaline Phosphatase: 41 U/L (ref 39–117)
BUN: 22 mg/dL (ref 6–23)
CO2: 28 mEq/L (ref 19–32)
Calcium: 8.8 mg/dL (ref 8.4–10.5)
Chloride: 97 mEq/L (ref 96–112)
Creatinine, Ser: 1.03 mg/dL (ref 0.40–1.20)
GFR: 48.82 mL/min — ABNORMAL LOW (ref >60.00)
Glucose, Bld: 81 mg/dL (ref 70–99)
Potassium: 4.9 mEq/L (ref 3.5–5.1)
Sodium: 132 mEq/L — ABNORMAL LOW (ref 135–145)
Total Bilirubin: 0.3 mg/dL (ref 0.2–1.2)
Total Protein: 6.7 g/dL (ref 6.0–8.3)

## 2020-07-30 LAB — CBC
HCT: 33 % — ABNORMAL LOW (ref 36.0–46.0)
Hemoglobin: 11.1 g/dL — ABNORMAL LOW (ref 12.0–15.0)
MCHC: 33.5 g/dL (ref 30.0–36.0)
MCV: 90.7 fl (ref 78.0–100.0)
Platelets: 220 10*3/uL (ref 150.0–400.0)
RBC: 3.64 Mil/uL — ABNORMAL LOW (ref 3.87–5.11)
RDW: 13.3 % (ref 11.5–15.5)
WBC: 6.2 10*3/uL (ref 4.0–10.5)

## 2020-07-30 LAB — TSH: TSH: 2.08 u[IU]/mL (ref 0.35–4.50)

## 2020-07-30 NOTE — Patient Instructions (Signed)
Good to see you again today- I will be in touch with your labs asap Please let me know if you are getting worse or have any other concerns in the meantime

## 2020-07-30 NOTE — Progress Notes (Addendum)
[place] [facility] [address], [place], [place] [address] [phone]  Date:  07/30/2020   Name:  DELISHA PEADEN   DOB:  October 01, 1930   MRN:  [id number]  PCP:  Darreld Mclean, MD    Chief Complaint: Flu like symptoms (Clammy skin, chills, diarrhea for one week, taking otc antidiarrheal, 95.1 temp) and Weakness (One episode of weakness, pins and needle feeling throughout body, elevated BP reading 176/98 home reading)   History of Present Illness:  [name] is a 87 y.o. 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, hypothyroidism, discoid lupus, rheumatoid arthritis She is treated with orencia per per Deveshwar  Pt here today with concern of diarrhea - she had contacted me late last week with concern of diarrhea for about one week, and she also noted a low temperature at home   8 days ago she had onset of chills, felt tremulous, and had diarrhea She left Sunday school and went home  No vomiting She continued to have diarrhea- she started imodium OTC and seemed to be doing better  Then this past saturday (today is Monday) she felt tingly all over and had a chill- she checked her BP and it was about 170/90  She drank some water and her BP went down to about 140/70 eventually  She felt a bit better yesterday and was able to attend church She rested all day and felt ok- however last night her BP went up again - she rested and drank water, her BP went back down   She takes her carvedilol on a regular basis - has not missed any doses   She is getting her orencia  infusion this week No cough No SOB or CP No abd pain No dysuria  Lab Results  Component Value Date   TSH 0.99 12/14/2019       Patient Active Problem List   Diagnosis Date Noted    Acute posthemorrhagic anemia    Rectal bleed 06/17/2019   Grade II internal hemorrhoids    Rectal ulcer    Popliteal artery occlusion, right ([place]) 04/10/2018   HTN (hypertension) 04/10/2018   Femoral artery pseudo-aneurysm, right ([place]) 04/09/2018   Anemia of chronic disease 02/24/2018   Unstable right hip arthroplasty 01/24/2018   History of revision of total replacement of right hip joint 01/24/2018   Hypovolemic shock ([place])    Hyperkalemia    Hyponatremia    Failed total hip arthroplasty ([place]) 11/27/2017   Status post revision of total hip 11/27/2017   Elevated cholesterol 10/11/2015   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[place]) 10/04/2014   Bilateral leg edema 08/01/2014   Elevated BP 08/01/2014   Rheumatoid arthritis involving multiple joints ([place]) 05/30/2014   Status post total replacement of right hip 04/28/2014   Long-term use of high-risk medication 11/11/2013   Symptomatic anemia 11/11/2013   Neuropathic pain 07/07/2013   Constipation due to pain medication 07/07/2013   Protein-calorie malnutrition, severe ([place]) 06/08/2013   Lung cancer, Right upper lobe 05/08/2013   Sciatica of right side 08/13/2011   Osteoporosis 03/07/2010   PARESTHESIA 03/07/2010   CT, CHEST, ABNORMAL 12/18/2008   ABNORMAL ECHOCARDIOGRAM 12/14/2008   SJOGREN'S SYNDROME 11/29/2008   HYPOGLYCEMIA 06/29/2006   RAYNAUD'S DISEASE 06/29/2006    Past Medical History:  Diagnosis Date   Arthralgia  of multiple joints    followed by dr Gerilyn Nestle   Arthritis    Cardiomyopathy ([place])    Chronic constipation    Chronic inflammatory arthritis    rhemotolgist-  dr a. Gerilyn Nestle ([facility])   Dry eyes    eye drops used    GERD (gastroesophageal reflux disease)    H/O discoid lupus erythematosus    Hiatal hernia    History of colon polyps    Hypothyroidism    Iron deficiency anemia    LBBB (left bundle branch block) 2010   Mitchell's disease (erythromelalgia) [facility])    neurologist-  dr patel   Nocturia     Non-small cell cancer of right lung [facility]) surgeon-- dr gerhardt/  oncologist-  dr Julien Nordmann--- per lov notes no recurrence/   11-18-2017 per pt denies any symptoms   dx 2015--  Stage IIA (T2b,N0,M0) , +EGFR  mutation in exon 21, non-small cell adenocarcinoma right upper lobe---  s/p  Right upper lobectomy , right middley wedge resection and node dissection---  no chemo or radiation therapy   OA (osteoarthritis)    hands   Osteoporosis    PONV (postoperative nausea and vomiting)    likes phenergan   Raynaud's phenomenon 1965   Renal insufficiency    Rheumatoid arthritis ([place])    Sciatica    Scoliosis    Sjogren's syndrome [facility])     Past Surgical History:  Procedure Laterality Date   ANTERIOR HIP REVISION Right 11/27/2017   Procedure: RIGHT HIP ACETABULAR REVISION;  Surgeon: [name], MD;  Location: [facility];  Service: Orthopedics;  Laterality: Right;   ANTERIOR HIP REVISION Right 01/24/2018   Procedure: OPEN REDUCTION OF DISLOCATED ANTERIOR HIP WITH REVISION OF LINER AND HIP BALL;  Surgeon: [name], MD;  Location: [facility];  Service: Orthopedics;  Laterality: Right;   APPENDECTOMY  1950s   BIOPSY  04/14/2018   Procedure: BIOPSY;  Surgeon: Yetta Flock, MD;  Location: [place];  Service: Gastroenterology;;   BIOPSY  04/16/2018   Procedure: BIOPSY;  Surgeon: Irving Copas., MD;  Location: [place];  Service: Gastroenterology;;   CARDIOVASCULAR STRESS TEST  12/2008    mild fixed basal to mid septal perfusion defect felt likely due to artifact from LBBB, no ischemia, EF 58%   COLONOSCOPY     COLONOSCOPY WITH PROPOFOL N/A 04/16/2018   Procedure: COLONOSCOPY WITH PROPOFOL;  Surgeon: Irving Copas., MD;  Location: [place];  Service: Gastroenterology;  Laterality: N/A;   ESOPHAGOGASTRODUODENOSCOPY (EGD) WITH PROPOFOL N/A 04/14/2018   Procedure: ESOPHAGOGASTRODUODENOSCOPY (EGD) WITH PROPOFOL;  Surgeon: Yetta Flock, MD;   Location: [place];  Service: Gastroenterology;  Laterality: N/A;   FEMORAL-POPLITEAL BYPASS GRAFT Right 04/10/2018   Procedure: REPAIR RIGHT FEMORAL ARTERY PSEUDOANEURYSM, RETROPERITONEAL EXPOSURE OF ILIAC ARTERY, RIGHT POPLITEAL EMBOLECTOMY;  Surgeon: Angelia Mould, MD;  Location: [place];  Service: Vascular;  Laterality: Right;   FLEXIBLE SIGMOIDOSCOPY N/A 06/17/2019   Procedure: FLEXIBLE SIGMOIDOSCOPY;  Surgeon: Lavena Bullion, DO;  [facility];  Service: Gastroenterology;  Laterality: N/A;   HEMOSTASIS CLIP PLACEMENT  06/17/2019   Procedure: HEMOSTASIS CLIP PLACEMENT;  Surgeon: Lavena Bullion, DO;  [facility];  Service: Gastroenterology;;   LYMPH NODE DISSECTION Right 06/07/2013   Procedure: LYMPH NODE DISSECTION;  Surgeon: [name], MD;  Location: [place];  Service: Thoracic;  Laterality: Right;   PATCH ANGIOPLASTY Right 04/10/2018   Procedure: PATCH  ANGIOPLASTY OF RIGHT FEMORAL ARTERY USING BOVINE  PATCH, PATCH ANGIOPLASTY OF RIGHT POPLITEAL ARTERY USING BOVINE PATCH;  Surgeon: Angelia Mould, MD;  Location: [place];  Service: Vascular;  Laterality: Right;   SCHLEROTHERAPY  06/17/2019   Procedure: [place] Ku OF VARICES;  Surgeon: Lavena Bullion, DO;  [facility];  Service: Gastroenterology;;   [place]   "large incision from chest to up to shoulder, the nerves were tied together, for raynaud's   THORACOTOMY  06/07/2013   Procedure: MINI/LIMITED THORACOTOMY; right middle lobe wedge resection;  Surgeon: [name], MD;  Location: [place];  Service: Thoracic;;   TONSILLECTOMY  child   TOTAL ABDOMINAL HYSTERECTOMY  1980's    W/ BSO   TOTAL HIP ARTHROPLASTY Right 04/28/2014   Procedure: RIGHT TOTAL HIP ARTHROPLASTY ANTERIOR APPROACH;  Surgeon: [name], MD;  [facility];  Service: Orthopedics;  Laterality: Right;   TRANSTHORACIC ECHOCARDIOGRAM  12/11/2008   ef 45-50%, grade 1  diastolic dysfunction/  mild LAE/  mild AR and MR/  trivial TR   VIDEO ASSISTED THORACOSCOPY (VATS)/WEDGE RESECTION Right 06/07/2013   Procedure: VIDEO ASSISTED THORACOSCOPY (VATS)/right upper lobectomy, On Q;  Surgeon: [name], MD;  [facility];  Service: Thoracic;  Laterality: Right;   VIDEO BRONCHOSCOPY N/A 06/07/2013   Procedure: VIDEO BRONCHOSCOPY;  Surgeon: [name], MD;  [facility];  Service: Thoracic;  Laterality: N/A;   VIDEO BRONCHOSCOPY WITH ENDOBRONCHIAL NAVIGATION N/A 05/04/2013   Procedure: VIDEO BRONCHOSCOPY WITH ENDOBRONCHIAL NAVIGATION;  Surgeon: [name], MD;  [facility];  Service: Thoracic;  Laterality: N/A;    Social History   Tobacco Use   Smoking status: Never   Smokeless tobacco: Never  Vaping Use   Vaping Use: Never used  Substance Use Topics   Alcohol use: Not Currently   Drug use: Never    Family History  Problem Relation Age of Onset   Coronary artery disease Father    Colon cancer Father    Diabetes Father    Cancer Father        colon   Other Mother 92       MVA   Healthy Sister    Healthy Brother    Healthy Daughter    Hypothyroidism Daughter    Other Brother        killed in war   Pneumonia Sister    Healthy Daughter    Esophageal cancer Neg Hx    Kidney disease Neg Hx    Liver disease Neg Hx     Allergies  Allergen Reactions   Amlodipine Rash   Prochlorperazine Edisylate Anaphylaxis    Compazine--- tongue swells and rash   Aspirin Other (See Comments)    nose bleeds. Cannot take NSAIDS    Cymbalta [Duloxetine Hcl] Diarrhea, Nausea And Vomiting and Other (See Comments)    Increased blood pressure   Pamelor [Nortriptyline Hcl] Diarrhea and Nausea Only    Increased Heart rate and BP    Medication list has been reviewed and updated.  Current Outpatient Medications on File Prior to Visit  Medication Sig Dispense Refill   Abatacept (ORENCIA IV) Inject into the vein every 30 (thirty) days.      Acetaminophen (TYLENOL EXTRA STRENGTH PO) Take 1-2 tablets by mouth every 6 (six) hours as needed (pain).      Acetaminophen-Codeine 300-30 MG tablet TAKE 1-2 TABLETS BY MOUTH EVERY 8 (EIGHT) HOURS AS NEEDED FOR MODERATE PAIN. 30 tablet 2   albuterol (VENTOLIN HFA) 108 (90  Base) MCG/ACT inhaler TAKE 2 PUFFS BY MOUTH EVERY 6 HOURS AS NEEDED FOR WHEEZE OR SHORTNESS OF BREATH 6.7 each 2   Biotin 1000 MCG tablet Take 1,000 mcg by mouth daily.     Calcium Carbonate-Vitamin D 500-125 MG-UNIT TABS Take 1 tablet by mouth daily.     carvedilol (COREG) 3.125 MG tablet TAKE 1 TABLET (3.125 MG TOTAL) BY MOUTH 2 (TWO) TIMES DAILY. 180 tablet 1   denosumab (PROLIA) 60 MG/ML SOSY injection Inject 60 mg into the skin every 6 (six) months. Last injection 04-22-2019     docusate sodium (COLACE) 100 MG capsule Take 100 mg by mouth 2 (two) times daily.     gabapentin (NEURONTIN) 300 MG capsule TAKE 1 CAPSULE BY MOUTH IN THE MORNING, 1 IN THE AFTERNOON, AND 2 AT BEDTIME 360 capsule 1   Glucosamine-Chondroit-Vit C-Mn (GLUCOSAMINE CHONDR 1500 COMPLX PO) Take 1 capsule by mouth daily.     levothyroxine (EUTHYROX) 75 MCG tablet TAKE 1 TABLET BY MOUTH ONCE DAILY BEFORE BREAKFAST 90 tablet 1   methocarbamol (ROBAXIN) 500 MG tablet TAKE 1 TABLET BY MOUTH EVERY 6 HOURS AS NEEDED FOR MUSCLE SPASMS. 40 tablet 0   Multiple Vitamin (MULTIVITAMIN) tablet Take 1 tablet by mouth daily.     omeprazole (PRILOSEC) 20 MG capsule Take 1 capsule (20 mg total) by mouth daily. 180 capsule 1   Probiotic Product (PROBIOTIC PO) Take 1 capsule by mouth daily.      sulfamethoxazole-trimethoprim (BACTRIM DS) 800-160 MG tablet Take 1 tablet by mouth 2 (two) times daily. 10 tablet 0   No current facility-administered medications on file prior to visit.    Review of Systems:  As per HPI- otherwise negative.   Physical Examination: Vitals:   07/30/20 1006  BP: 136/72  Pulse: 78  Resp: 18  Temp: (!) 97.1 F (36.2 C)  SpO2: 97%   Vitals:    07/30/20 1006  Weight: 107 lb (48.5 kg)  Height: 5' (1.524 m)   Body mass index is 20.9 kg/m. Ideal Body Weight: Weight in (lb) to have BMI = 25: 127.7  GEN: no acute distress.  Well appearing elderly lady, looks her normal self HEENT: Atraumatic, Normocephalic.  Ears and Nose: No external deformity. CV: RRR, No M/G/R. No JVD. No thrill. No extra heart sounds. PULM: CTA B, no wheezes, crackles, rhonchi. No retractions. No resp. distress. No accessory muscle use. ABD: S, NT, ND, +BS. No rebound. No HSM. EXTR: No c/c/e PSYCH: Normally interactive. Conversant.    Assessment and Plan: Diarrhea, unspecified type - Plan: CBC, Comprehensive metabolic panel  Malaise - Plan: Urine Culture, CBC, Comprehensive metabolic panel  Acquired hypothyroidism - Plan: TSH Patient today with concern of diarrhea and malaise.  She has not noticed any urinary tract symptoms, but given age will check a urine culture.  Her diarrhea is now somewhat better- labs pending as above Will also follow-up on her thyroid today  This visit occurred during the SARS-CoV-2 public health emergency.  Safety protocols were in place, including screening questions prior to the visit, additional usage of staff PPE, and extensive cleaning of exam room while observing appropriate contact time as indicated for disinfecting solutions.   Signed Lamar Blinks, MD  Received her labs as below- message to pt Mild anemia is stable, patient actually had a flexible sigmoidoscopy last year due to heme positive stools Results for orders placed or performed in visit on 07/30/20  CBC  Result Value Ref Range   WBC 6.2 4.0 -  10.5 K/uL   RBC 3.64 (L) 3.87 - 5.11 Mil/uL   Platelets 220.0 150.0 - 400.0 K/uL   Hemoglobin 11.1 (L) 12.0 - 15.0 g/dL   HCT 33.0 (L) 36.0 - 46.0 %   MCV 90.7 78.0 - 100.0 fl   MCHC 33.5 30.0 - 36.0 g/dL   RDW 13.3 11.5 - 15.5 %  Comprehensive metabolic panel  Result Value Ref Range   Sodium 132 (L) 135 -  145 mEq/L   Potassium 4.9 3.5 - 5.1 mEq/L   Chloride 97 96 - 112 mEq/L   CO2 28 19 - 32 mEq/L   Glucose, Bld 81 70 - 99 mg/dL   BUN 22 6 - 23 mg/dL   Creatinine, Ser 1.03 0.40 - 1.20 mg/dL   Total Bilirubin 0.3 0.2 - 1.2 mg/dL   Alkaline Phosphatase 41 39 - 117 U/L   AST 21 0 - 37 U/L   ALT 11 0 - 35 U/L   Total Protein 6.7 6.0 - 8.3 g/dL   Albumin 4.2 3.5 - 5.2 g/dL   GFR 48.82 (L) >60.00 mL/min   Calcium 8.8 8.4 - 10.5 mg/dL  TSH  Result Value Ref Range   TSH 2.08 0.35 - 4.50 uIU/mL   Received urine culture 6/15- negative Message to pt

## 2020-07-31 LAB — URINE CULTURE
MICRO NUMBER:: 11999786
Result:: NO GROWTH
SPECIMEN QUALITY:: ADEQUATE

## 2020-07-31 NOTE — Progress Notes (Signed)
Office Visit Note  Patient: [name]             Date of Birth: Feb 29, 1930           MRN: [id number]             PCP: Darreld Mclean, MD Referring: Darreld Mclean, MD Visit Date: 08/14/2020 Occupation: @GUAROCC @  Subjective:  Medication management.   History of Present Illness: [name] is a 87 y.o. female with a history of rheumatoid arthritis and osteoporosis.  She states she is doing well on Orencia without any joint swelling.  She states the mobility in her C-spine is better.  She still has limited range of motion in her shoulders.  She continues to have some stiffness in her hands and feet due to underlying osteoarthritis.  Her right hip replacement is doing better she notices some clicking sensation in her right hip at times.  She uses a walker or a cane for mobility at home.  She continues to have Raynauds symptoms when exposed to the colder temperatures.  Her dry mouth and dry eye symptoms persist.  Hair loss is stable.  She has an appointment coming up with Dr.Mohamed in August after CT scan of her chest.  Activities of Daily Living:  Patient reports morning stiffness for all day. Patient Denies nocturnal pain.  Difficulty dressing/grooming: Denies Difficulty climbing stairs: Reports Difficulty getting out of chair: Denies Difficulty using hands for taps, buttons, cutlery, and/or writing: Reports  Review of Systems  Constitutional:  Negative for fatigue.  HENT:  Negative for mouth sores, mouth dryness and nose dryness.   Eyes:  Positive for dryness. Negative for pain and itching.  Respiratory:  Negative for shortness of breath and difficulty breathing.   Cardiovascular:  Negative for chest pain and palpitations.  Gastrointestinal:  Negative for blood in stool, constipation and diarrhea.  Endocrine: Negative for increased urination.  Genitourinary:  Negative for difficulty urinating.  Musculoskeletal:  Positive for joint pain, joint pain, myalgias,  morning stiffness and myalgias. Negative for joint swelling and muscle tenderness.  Skin:  Positive for color change. Negative for rash and redness.  Allergic/Immunologic: Negative for susceptible to infections.  Neurological:  Negative for dizziness, numbness, headaches, memory loss and weakness.  Hematological:  Positive for bruising/bleeding tendency.  Psychiatric/Behavioral:  Negative for confusion.    PMFS History:  Patient Active Problem List   Diagnosis Date Noted   Acute posthemorrhagic anemia    Rectal bleed 06/17/2019   Grade II internal hemorrhoids    Rectal ulcer    Popliteal artery occlusion, right ([place]) 04/10/2018   HTN (hypertension) 04/10/2018   Femoral artery pseudo-aneurysm, right ([place]) 04/09/2018   Anemia of chronic disease 02/24/2018   Unstable right hip arthroplasty 01/24/2018   History of revision of total replacement of right hip joint 01/24/2018   Hypovolemic shock ([place])    Hyperkalemia    Hyponatremia    Failed total hip arthroplasty ([place]) 11/27/2017   Status post revision of total hip 11/27/2017   Elevated cholesterol 10/11/2015   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[place]) 10/04/2014   Bilateral leg edema 08/01/2014   Elevated BP 08/01/2014   Rheumatoid arthritis involving multiple joints ([place]) 05/30/2014   Status post total replacement of right hip 04/28/2014   Long-term use of high-risk medication 11/11/2013   Symptomatic anemia 11/11/2013   Neuropathic pain 07/07/2013   Constipation due to pain medication 07/07/2013   Protein-calorie malnutrition, severe ([place]) 06/08/2013   Lung cancer, Right upper  lobe 05/08/2013   Sciatica of right side 08/13/2011   Osteoporosis 03/07/2010   PARESTHESIA 03/07/2010   CT, CHEST, ABNORMAL 12/18/2008   ABNORMAL ECHOCARDIOGRAM 12/14/2008   SJOGREN'S SYNDROME 11/29/2008   HYPOGLYCEMIA 06/29/2006   RAYNAUD'S DISEASE 06/29/2006    Past Medical History:  Diagnosis Date   Arthralgia of multiple joints    followed by dr  Gerilyn Nestle   Arthritis    Cardiomyopathy ([place])    Chronic constipation    Chronic inflammatory arthritis    rhemotolgist-  dr a. Gerilyn Nestle ([facility])   Dry eyes    eye drops used    GERD (gastroesophageal reflux disease)    H/O discoid lupus erythematosus    Hiatal hernia    History of colon polyps    Hypothyroidism    Iron deficiency anemia    LBBB (left bundle branch block) 2010   Mitchell's disease (erythromelalgia) [facility])    neurologist-  dr patel   Nocturia    Non-small cell cancer of right lung [facility]) surgeon-- dr gerhardt/  oncologist-  dr Julien Nordmann--- per lov notes no recurrence/   11-18-2017 per pt denies any symptoms   dx 2015--  Stage IIA (T2b,N0,M0) , +EGFR  mutation in exon 21, non-small cell adenocarcinoma right upper lobe---  s/p  Right upper lobectomy , right middley wedge resection and node dissection---  no chemo or radiation therapy   OA (osteoarthritis)    hands   Osteoporosis    PONV (postoperative nausea and vomiting)    likes phenergan   Raynaud's phenomenon 1965   Renal insufficiency    Rheumatoid arthritis ([place])    Sciatica    Scoliosis    Sjogren's syndrome ([place])     Family History  Problem Relation Age of Onset   Coronary artery disease Father    Colon cancer Father    Diabetes Father    Cancer Father        colon   Other Mother 23       MVA   Healthy Sister    Healthy Brother    Healthy Daughter    Hypothyroidism Daughter    Other Brother        killed in war   Pneumonia Sister    Healthy Daughter    Esophageal cancer Neg Hx    Kidney disease Neg Hx    Liver disease Neg Hx    Past Surgical History:  Procedure Laterality Date   ANTERIOR HIP REVISION Right 11/27/2017   Procedure: RIGHT HIP ACETABULAR REVISION;  Surgeon: [name], MD;  Location: [facility];  Service: Orthopedics;  Laterality: Right;   ANTERIOR HIP REVISION Right 01/24/2018   Procedure: OPEN REDUCTION OF DISLOCATED ANTERIOR HIP WITH REVISION OF LINER AND  HIP BALL;  Surgeon: [name], MD;  [facility]: [facility];  Service: Orthopedics;  Laterality: Right;   APPENDECTOMY  1950s   BIOPSY  04/14/2018   Procedure: BIOPSY;  Surgeon: Yetta Flock, MD;  Location: [place];  Service: Gastroenterology;;   BIOPSY  04/16/2018   Procedure: BIOPSY;  Surgeon: Irving Copas., MD;  Location: [place];  Service: Gastroenterology;;   CARDIOVASCULAR STRESS TEST  12/2008    mild fixed basal to mid septal perfusion defect felt likely due to artifact from LBBB, no ischemia, EF 58%   COLONOSCOPY     COLONOSCOPY WITH PROPOFOL N/A 04/16/2018   Procedure: COLONOSCOPY WITH PROPOFOL;  Surgeon: Irving Copas., MD;  Location: [place];  Service: Gastroenterology;  Laterality:  N/A;   ESOPHAGOGASTRODUODENOSCOPY (EGD) WITH PROPOFOL N/A 04/14/2018   Procedure: ESOPHAGOGASTRODUODENOSCOPY (EGD) WITH PROPOFOL;  Surgeon: Yetta Flock, MD;  Location: [place];  Service: Gastroenterology;  Laterality: N/A;   FEMORAL-POPLITEAL BYPASS GRAFT Right 04/10/2018   Procedure: REPAIR RIGHT FEMORAL ARTERY PSEUDOANEURYSM, RETROPERITONEAL EXPOSURE OF ILIAC ARTERY, RIGHT POPLITEAL EMBOLECTOMY;  Surgeon: Angelia Mould, MD;  Location: [place];  Service: Vascular;  Laterality: Right;   FLEXIBLE SIGMOIDOSCOPY N/A 06/17/2019   Procedure: FLEXIBLE SIGMOIDOSCOPY;  Surgeon: Lavena Bullion, DO;  [facility];  Service: Gastroenterology;  Laterality: N/A;   HEMOSTASIS CLIP PLACEMENT  06/17/2019   Procedure: HEMOSTASIS CLIP PLACEMENT;  Surgeon: Lavena Bullion, DO;  [facility];  Service: Gastroenterology;;   LYMPH NODE DISSECTION Right 06/07/2013   Procedure: LYMPH NODE DISSECTION;  Surgeon: [name], MD;  Location: [place];  Service: Thoracic;  Laterality: Right;   PATCH ANGIOPLASTY Right 04/10/2018   Procedure: PATCH  ANGIOPLASTY OF RIGHT FEMORAL ARTERY USING BOVINE PATCH, PATCH ANGIOPLASTY OF RIGHT POPLITEAL  ARTERY USING BOVINE PATCH;  Surgeon: Angelia Mould, MD;  Location: [place];  Service: Vascular;  Laterality: Right;   SCHLEROTHERAPY  06/17/2019   Procedure: [place]ward Ku OF VARICES;  Surgeon: Lavena Bullion, DO;  [facility];  Service: Gastroenterology;;   [place]   "large incision from chest to up to shoulder, the nerves were tied together, for raynaud's   THORACOTOMY  06/07/2013   Procedure: MINI/LIMITED THORACOTOMY; right middle lobe wedge resection;  Surgeon: [name], MD;  Location: [place];  Service: Thoracic;;   TONSILLECTOMY  child   TOTAL ABDOMINAL HYSTERECTOMY  1980's    W/ BSO   TOTAL HIP ARTHROPLASTY Right 04/28/2014   Procedure: RIGHT TOTAL HIP ARTHROPLASTY ANTERIOR APPROACH;  Surgeon: [name], MD;  [facility];  Service: Orthopedics;  Laterality: Right;   TRANSTHORACIC ECHOCARDIOGRAM  12/11/2008   ef 22-29%, grade 1 diastolic dysfunction/  mild LAE/  mild AR and MR/  trivial TR   VIDEO ASSISTED THORACOSCOPY (VATS)/WEDGE RESECTION Right 06/07/2013   Procedure: VIDEO ASSISTED THORACOSCOPY (VATS)/right upper lobectomy, On Q;  Surgeon: [name], MD;  Location: [place];  Service: Thoracic;  Laterality: Right;   VIDEO BRONCHOSCOPY N/A 06/07/2013   Procedure: VIDEO BRONCHOSCOPY;  Surgeon: [name], MD;  Location: [place];  Service: Thoracic;  Laterality: N/A;   VIDEO BRONCHOSCOPY WITH ENDOBRONCHIAL NAVIGATION N/A 05/04/2013   Procedure: VIDEO BRONCHOSCOPY WITH ENDOBRONCHIAL NAVIGATION;  Surgeon: [name], MD;  Location: [place];  Service: Thoracic;  Laterality: N/A;   Social History   Social History Narrative   Lives with husband, daughter and grandchild local.   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 History  Administered Date(s) Administered   Fluad Quad(high Dose 65+) 11/03/2018, 12/14/2019   Influenza Split 11/21/2011   Influenza Whole  11/29/2007, 11/29/2008, 11/29/2009   Influenza, High Dose Seasonal PF 10/30/2012, 01/02/2015, 11/12/2017   Influenza,inj,Quad PF,6+ Mos 11/22/2013, 11/14/2015   Influenza-Unspecified 11/13/2016, 11/12/2017   PFIZER(Purple Top)SARS-COV-2 Vaccination 04/12/2019, 05/03/2019, 01/20/2020   Pneumococcal Conjugate-13 05/22/2015   Pneumococcal Polysaccharide-23 06/13/2013   Tdap 08/17/2017   Zoster, Live 01/27/2014     Objective: Vital Signs: BP 130/69 (BP Location: Left Arm, Patient Position: Sitting, Cuff Size: Normal)   Pulse 70   Ht 5' (1.524 m)   Wt 106 lb 9.6 oz (48.4 kg)   BMI 20.82 kg/m    Physical Exam  Vitals and nursing note reviewed.  Constitutional:      Appearance: She is well-developed.  HENT:     Head: Normocephalic and atraumatic.  Eyes:     Conjunctiva/sclera: Conjunctivae normal.  Cardiovascular:     Rate and Rhythm: Normal rate and regular rhythm.     Heart sounds: Normal heart sounds.  Pulmonary:     Effort: Pulmonary effort is normal.     Breath sounds: Normal breath sounds.  Abdominal:     General: Bowel sounds are normal.     Palpations: Abdomen is soft.  Musculoskeletal:     Cervical back: Normal range of motion.  Lymphadenopathy:     Cervical: No cervical adenopathy.  Skin:    General: Skin is warm and dry.     Capillary Refill: Capillary refill takes less than 2 seconds.  Neurological:     Mental Status: She is alert and oriented to person, place, and time.  Psychiatric:        Behavior: Behavior normal.     Musculoskeletal Exam: C-spine was in good range of motion.  Shoulder joints and elbow joints with good range of motion.  She had no synovitis of her wrist joints or MCP joints.  MCP thickening was noted.  Subluxation of several PIP and DIP joints was noted.  She had good range of motion of her hip joints and knee joints.  There was no tenderness over ankles.  She had osteoarthritic changes in her feet without any active synovitis.  CDAI  Exam: CDAI Score: 0.6  Patient Global: 3 mm; Provider Global: 3 mm Swollen: 0 ; Tender: 0  Joint Exam 08/14/2020   No joint exam has been documented for this visit   There is currently no information documented on the homunculus. Go to the Rheumatology activity and complete the homunculus joint exam.  Investigation: No additional findings.  Imaging: No results found.  Recent Labs: Lab Results  Component Value Date   WBC 6.2 07/30/2020   HGB 11.1 (L) 07/30/2020   PLT 220.0 07/30/2020   NA 132 (L) 07/30/2020   K 4.9 07/30/2020   CL 97 07/30/2020   CO2 28 07/30/2020   GLUCOSE 81 07/30/2020   BUN 22 07/30/2020   CREATININE 1.03 07/30/2020   BILITOT 0.3 07/30/2020   ALKPHOS 41 07/30/2020   AST 21 07/30/2020   ALT 11 07/30/2020   PROT 6.7 07/30/2020   ALBUMIN 4.2 07/30/2020   CALCIUM 8.8 07/30/2020   GFRAA 60 05/21/2020   QFTBGOLDPLUS Negative 07/04/2020    Speciality Comments: MTX, Arava-side effects  Procedures:  No procedures performed Allergies: Amlodipine, Prochlorperazine edisylate, Aspirin, Cymbalta [duloxetine hcl], and Pamelor [nortriptyline hcl]   Assessment / Plan:     Visit Diagnoses: Rheumatoid arthritis of multiple sites with negative rheumatoid factor (HCC) - RF negative, anti-CCP negative: She is clinically doing very well with no synovitis on examination.  She states that her quality of life is improved a lot since she has been on Orencia infusions.  High risk medication use - Orencia IV now (previously on [place]).  Her labs from June 2022 were reviewed which were stable.  She was advised to get COVID-19 booster.  Instructions placed in the AVS.  All other immunizations were also discussed.  She was advised to stop Orencia in case she develops an infection.  She may restart Orencia once the infection resolves.  Sicca syndrome (HCC)-she continues to have some dry eye symptoms.  Over-the-counter products were discussed  Raynaud's  disease  without gangrene-symptoms are more prominent during the winter months.  Hair loss-her hair loss has improved since she has been off leflunomide.  Status post total replacement of right hip-she states she has off-and-on popping but not much discomfort.  Other idiopathic scoliosis, thoracolumbar region-she has severe thoracolumbar scoliosis.  She denies any discomfort currently.  Age-related osteoporosis without current pathological fracture - DEXA 01/28/2019: The BMD measured at Femur Neck is 0.636 g/cm2 with a T-score of -2.9. last prolia: 06/06/2020.  She will be getting her next Prolia injection in October.  We will get repeat DEXA scan in December 2022.  Height loss  Malignant neoplasm of upper lobe of right lung (HCC) - recent CT scan showed new lesions and most likely metastatic lung disease.  Patient has been followed by Dr. Julien Nordmann.  She will have repeat CT scan in August.  I had detailed discussion with Dr. Julien Nordmann regarding Orencia use and lung cancer.  Patient does not want to change her treatment.  We will wait until the CT scan results are available.  She is scheduled for a follow-up visit with Dr. Earlie Server in August.  Anemia of chronic disease  Essential hypertension  Elevated cholesterol  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[employer])  Orders: No orders of the defined types were placed in this encounter.  No orders of the defined types were placed in this encounter.    Follow-Up Instructions: Return in about 5 months (around 01/14/2021) for Rheumatoid arthritis.   Bo Merino, MD  Note - This record has been created using Editor, commissioning.  Chart creation errors have been sought, but may not always  have been located. Such creation errors do not reflect on  the standard of medical care.

## 2020-08-01 ENCOUNTER — Encounter: Payer: Self-pay | Admitting: Family Medicine

## 2020-08-01 ENCOUNTER — Ambulatory Visit (HOSPITAL_COMMUNITY)
Admission: RE | Admit: 2020-08-01 | Discharge: 2020-08-01 | Disposition: A | Discharge status: Home / Self Care | Payer: Medicare PPO | Source: Ambulatory Visit | Attending: Rheumatology | Admitting: Rheumatology

## 2020-08-01 ENCOUNTER — Other Ambulatory Visit: Payer: Self-pay

## 2020-08-01 DIAGNOSIS — M0609 Rheumatoid arthritis without rheumatoid factor, multiple sites: Secondary | ICD-10-CM | POA: Diagnosis not present

## 2020-08-01 MED ORDER — SODIUM CHLORIDE 0.9 % IV SOLN
500.0000 mg | INTRAVENOUS | Status: DC
  Administered 2020-08-01: 500 mg via INTRAVENOUS
  Filled 2020-08-01: qty 20

## 2020-08-01 MED ORDER — DIPHENHYDRAMINE HCL 25 MG PO CAPS: 25.0000 mg | ORAL_CAPSULE | ORAL | Status: DC

## 2020-08-01 MED ORDER — ACETAMINOPHEN 325 MG PO TABS
650.0000 mg | ORAL_TABLET | ORAL | Status: DC
Start: 2020-08-01 — End: 2020-08-02

## 2020-08-03 ENCOUNTER — Other Ambulatory Visit: Payer: Self-pay | Admitting: Family Medicine

## 2020-08-03 DIAGNOSIS — M2550 Pain in unspecified joint: Secondary | ICD-10-CM

## 2020-08-06 NOTE — Telephone Encounter (Signed)
Requesting: Tylenol #3 Contract: none UDS: none Last Visit: 07/30/20 Next Visit: none Last Refill: 3/31/  Please Advise

## 2020-08-14 ENCOUNTER — Ambulatory Visit (INDEPENDENT_AMBULATORY_CARE_PROVIDER_SITE_OTHER): Payer: Medicare PPO | Admitting: Rheumatology

## 2020-08-14 ENCOUNTER — Encounter: Payer: Self-pay | Admitting: Rheumatology

## 2020-08-14 ENCOUNTER — Other Ambulatory Visit: Payer: Self-pay

## 2020-08-14 VITALS — BP 130/69 | HR 70 | Ht 60.0 in | Wt 106.6 lb

## 2020-08-14 DIAGNOSIS — I7300 Raynaud's syndrome without gangrene: Secondary | ICD-10-CM | POA: Diagnosis not present

## 2020-08-14 DIAGNOSIS — D638 Anemia in other chronic diseases classified elsewhere: Secondary | ICD-10-CM

## 2020-08-14 DIAGNOSIS — R29890 Loss of height: Secondary | ICD-10-CM | POA: Diagnosis not present

## 2020-08-14 DIAGNOSIS — M810 Age-related osteoporosis without current pathological fracture: Secondary | ICD-10-CM

## 2020-08-14 DIAGNOSIS — E270 Other adrenocortical overactivity: Secondary | ICD-10-CM

## 2020-08-14 DIAGNOSIS — M0609 Rheumatoid arthritis without rheumatoid factor, multiple sites: Secondary | ICD-10-CM

## 2020-08-14 DIAGNOSIS — C3411 Malignant neoplasm of upper lobe, right bronchus or lung: Secondary | ICD-10-CM

## 2020-08-14 DIAGNOSIS — M3500 Sicca syndrome, unspecified: Secondary | ICD-10-CM

## 2020-08-14 DIAGNOSIS — M4125 Other idiopathic scoliosis, thoracolumbar region: Secondary | ICD-10-CM | POA: Diagnosis not present

## 2020-08-14 DIAGNOSIS — E7800 Pure hypercholesterolemia, unspecified: Secondary | ICD-10-CM

## 2020-08-14 DIAGNOSIS — L659 Nonscarring hair loss, unspecified: Secondary | ICD-10-CM | POA: Diagnosis not present

## 2020-08-14 DIAGNOSIS — Z79899 Other long term (current) drug therapy: Secondary | ICD-10-CM | POA: Diagnosis not present

## 2020-08-14 DIAGNOSIS — Z96641 Presence of right artificial hip joint: Secondary | ICD-10-CM | POA: Diagnosis not present

## 2020-08-14 DIAGNOSIS — I10 Essential (primary) hypertension: Secondary | ICD-10-CM

## 2020-08-14 NOTE — Patient Instructions (Signed)
COVID-19 vaccine recommendations:   COVID-19 vaccine is recommended for everyone (unless you are allergic to a vaccine component), even if you are on a medication that suppresses your immune system.   If you are on Methotrexate, Cellcept (mycophenolate), Rinvoq, [name], and Olumiant- hold the medication for 1 week after each vaccine. Hold Methotrexate for 2 weeks after the single dose COVID-19 vaccine.   If you are on Orencia subcutaneous injection - hold medication one week prior to and one week after the first COVID-19 vaccine dose (only).   If you are on Orencia IV infusions- time vaccination administration so that the first COVID-19 vaccination will occur four weeks after the infusion and postpone the subsequent infusion by one week.   If you are on Cyclophosphamide or Rituxan infusions please contact your doctor prior to receiving the COVID-19 vaccine.   Do not take Tylenol or any anti-inflammatory medications (NSAIDs) 24 hours prior to the COVID-19 vaccination.   There is no direct evidence about the efficacy of the COVID-19 vaccine in individuals who are on medications that suppress the immune system.   Even if you are fully vaccinated, and you are on any medications that suppress your immune system, please continue to wear a mask, maintain at least six feet social distance and practice hand hygiene.   If you develop a COVID-19 infection, please contact your PCP or our office to determine if you need monoclonal antibody infusion.  The booster vaccine is now available for immunocompromised patients.   Please see the following web sites for updated information.   [email or web address]://[email or web address]/[email or web address]-[email or web address]-Patient-Resources.[email or web address]   Vaccines You are taking a medication(s) that can suppress your immune system.  The following immunizations are recommended: Flu annually Covid-19  Td/Tdap (tetanus, diphtheria, pertussis) every 10 years Pneumonia (Prevnar 15 then  Pneumovax 23 at least 1 year apart.  Alternatively, can take Prevnar 20 without needing additional dose) Shingrix (after age 27): 2 doses from 4 weeks to 6 months apart  Please check with your PCP to make sure you are up to date.    If you test POSITIVE for COVID19 and have MILD to MODERATE symptoms: First, call your PCP if you would like to receive COVID19 treatment AND Hold your medications during the infection and for at least 1 week after your symptoms have resolved: Injectable medication (Benlysta, Cimzia, Cosentyx, Enbrel, Humira, Orencia, Remicade, Simponi, Stelara, Taltz, Tremfya) Methotrexate Leflunomide (Arava) Mycophenolate (Cellcept) [name], Olumiant, or Rinvoq If you take Actemra or Kevzara, you DO NOT need to hold these for COVID19 infection.  If you test POSITIVE for COVID19 and have NO symptoms: First, call your PCP if you would like to receive COVID19 treatment AND Hold your medications for at least 10 days after the day that you tested positive Injectable medication (Benlysta, Cimzia, Cosentyx, Enbrel, Humira, Orencia, Remicade, Simponi, Stelara, Taltz, Tremfya) Methotrexate Leflunomide (Arava) Mycophenolate (Cellcept) [name], Olumiant, or Rinvoq If you take Actemra or Kevzara, you DO NOT need to hold these for COVID19 infection.   If you have signs or symptoms of an infection or start antibiotics: First, call your PCP for workup of your infection. Hold your medication through the infection, until you complete your antibiotics, and until symptoms resolve if you take the following: Injectable medication (Actemra, Benlysta, Cimzia, Cosentyx, Enbrel, Humira, Kevzara, Orencia, Remicade, Simponi, Stelara, Taltz, Tremfya) Methotrexate Leflunomide (Arava) Mycophenolate (Cellcept) [name], Olumiant, or Rinvoq

## 2020-08-17 ENCOUNTER — Other Ambulatory Visit: Payer: Self-pay | Admitting: Family Medicine

## 2020-08-29 ENCOUNTER — Other Ambulatory Visit: Payer: Self-pay

## 2020-08-29 ENCOUNTER — Ambulatory Visit (HOSPITAL_COMMUNITY)
Admission: RE | Admit: 2020-08-29 | Discharge: 2020-08-29 | Disposition: A | Discharge status: Home / Self Care | Payer: Medicare PPO | Source: Ambulatory Visit | Attending: Rheumatology | Admitting: Rheumatology

## 2020-08-29 DIAGNOSIS — M0609 Rheumatoid arthritis without rheumatoid factor, multiple sites: Secondary | ICD-10-CM | POA: Diagnosis not present

## 2020-08-29 MED ORDER — ACETAMINOPHEN 325 MG PO TABS: 650.0000 mg | ORAL_TABLET | ORAL | Status: DC

## 2020-08-29 MED ORDER — DIPHENHYDRAMINE HCL 25 MG PO CAPS: 25.0000 mg | ORAL_CAPSULE | ORAL | Status: DC

## 2020-08-29 MED ORDER — SODIUM CHLORIDE 0.9 % IV SOLN
500.0000 mg | INTRAVENOUS | Status: DC
  Administered 2020-08-29: 500 mg via INTRAVENOUS
  Filled 2020-08-29: qty 20

## 2020-09-22 ENCOUNTER — Other Ambulatory Visit: Payer: Self-pay | Admitting: Orthopaedic Surgery

## 2020-09-24 NOTE — Telephone Encounter (Signed)
ok 

## 2020-09-25 ENCOUNTER — Other Ambulatory Visit: Payer: Self-pay | Admitting: Pharmacist

## 2020-09-25 DIAGNOSIS — M0609 Rheumatoid arthritis without rheumatoid factor, multiple sites: Secondary | ICD-10-CM

## 2020-09-25 DIAGNOSIS — Z79899 Other long term (current) drug therapy: Secondary | ICD-10-CM

## 2020-09-25 NOTE — Progress Notes (Signed)
Next infusion scheduled for Orencia on 09/25/20 and due for updated orders. Diagnosis: M06.09 (RA)  Dose: Orencia 500mg  every 28 days (appropriate with last weight of 46.7kg)  Last Clinic Visit: 08/14/20 Next Clinic Visit: 12/11/20  Last infusion: 08/29/20 Labs: 06/05/20. Creatinine is elevated but stable.   Anemia persists. WBC improved. TB Gold: negative on 07/04/20  Orders placed for Orencia 500mg  x 3 doses along with premedication of Tylenol and Benadryl to be administered 30 minutes before medication infusion.  Standing CBC with diff/platelet and CMP with GFR orders placed to be drawn every 2 months.  TB gold due 07/04/21  Knox Saliva, PharmD, MPH Clinical Pharmacist (Rheumatology and Pulmonology)

## 2020-09-26 ENCOUNTER — Other Ambulatory Visit: Payer: Self-pay

## 2020-09-26 ENCOUNTER — Ambulatory Visit (HOSPITAL_COMMUNITY)
Admission: RE | Admit: 2020-09-26 | Discharge: 2020-09-26 | Disposition: A | Discharge status: Home / Self Care | Payer: Medicare PPO | Source: Ambulatory Visit | Attending: Rheumatology | Admitting: Rheumatology

## 2020-09-26 DIAGNOSIS — M0609 Rheumatoid arthritis without rheumatoid factor, multiple sites: Secondary | ICD-10-CM

## 2020-09-26 DIAGNOSIS — Z79899 Other long term (current) drug therapy: Secondary | ICD-10-CM | POA: Diagnosis not present

## 2020-09-26 LAB — CBC WITH DIFFERENTIAL/PLATELET
Abs Immature Granulocytes: 0.01 10*3/uL (ref 0.00–0.07)
Basophils Absolute: 0 10*3/uL (ref 0.0–0.1)
Basophils Relative: 1 %
Eosinophils Absolute: 0 10*3/uL (ref 0.0–0.5)
Eosinophils Relative: 1 %
HCT: 34.4 % — ABNORMAL LOW (ref 36.0–46.0)
Hemoglobin: 10.9 g/dL — ABNORMAL LOW (ref 12.0–15.0)
Immature Granulocytes: 0 %
Lymphocytes Relative: 22 %
Lymphs Abs: 0.9 10*3/uL (ref 0.7–4.0)
MCH: 29.9 pg (ref 26.0–34.0)
MCHC: 31.7 g/dL (ref 30.0–36.0)
MCV: 94.5 fL (ref 80.0–100.0)
Monocytes Absolute: 0.4 10*3/uL (ref 0.1–1.0)
Monocytes Relative: 9 %
Neutro Abs: 2.7 10*3/uL (ref 1.7–7.7)
Neutrophils Relative %: 67 %
Platelets: 201 10*3/uL (ref 150–400)
RBC: 3.64 MIL/uL — ABNORMAL LOW (ref 3.87–5.11)
RDW: 12.9 % (ref 11.5–15.5)
WBC: 4.1 10*3/uL (ref 4.0–10.5)
nRBC: 0 % (ref 0.0–0.2)

## 2020-09-26 LAB — COMPREHENSIVE METABOLIC PANEL
ALT: 16 U/L (ref 0–44)
AST: 28 U/L (ref 15–41)
Albumin: 3.9 g/dL (ref 3.5–5.0)
Alkaline Phosphatase: 33 U/L — ABNORMAL LOW (ref 38–126)
Anion gap: 7 (ref 5–15)
BUN: 26 mg/dL — ABNORMAL HIGH (ref 8–23)
CO2: 26 mmol/L (ref 22–32)
Calcium: 8.8 mg/dL — ABNORMAL LOW (ref 8.9–10.3)
Chloride: 98 mmol/L (ref 98–111)
Creatinine, Ser: 1.03 mg/dL — ABNORMAL HIGH (ref 0.44–1.00)
GFR, Estimated: 53 mL/min — ABNORMAL LOW (ref >60)
Glucose, Bld: 98 mg/dL (ref 70–99)
Potassium: 4.6 mmol/L (ref 3.5–5.1)
Sodium: 131 mmol/L — ABNORMAL LOW (ref 135–145)
Total Bilirubin: 0.3 mg/dL (ref 0.3–1.2)
Total Protein: 6.6 g/dL (ref 6.5–8.1)

## 2020-09-26 MED ORDER — DIPHENHYDRAMINE HCL 25 MG PO CAPS: 25.0000 mg | ORAL_CAPSULE | ORAL | Status: DC

## 2020-09-26 MED ORDER — SODIUM CHLORIDE 0.9 % IV SOLN
500.0000 mg | INTRAVENOUS | Status: DC
  Administered 2020-09-26: 500 mg via INTRAVENOUS
  Filled 2020-09-26: qty 20

## 2020-09-26 MED ORDER — ACETAMINOPHEN 325 MG PO TABS: 650.0000 mg | ORAL_TABLET | ORAL | Status: DC

## 2020-09-26 NOTE — Progress Notes (Signed)
Creatinine and GFR are stable.  Please advise the avoidance of NSAIDs.  Calcium is low.  Please advise the patient to increase dietary calcium.  Sodium and alk phos are also low but stable.  RBC count, hgb, and hct are low but stable.

## 2020-10-01 ENCOUNTER — Telehealth: Payer: Self-pay

## 2020-10-01 ENCOUNTER — Encounter: Payer: Self-pay | Admitting: Family Medicine

## 2020-10-01 NOTE — Telephone Encounter (Signed)
Spoke with patient, she states that she had been doing well with her bowel movements until she went to the beach last week and ate seafood. She states that 3-4 hours after that meal she had bad diarrhea. She states that the following day the diarrhea had stopped but her rectum was hurting really bad and was irritated. She states that she noticed a little BRB and was having trouble having a bowel movement because it was so painful. Patient states that she had to force herself to have a bowel movement, she feels much better but continues to have the rectal pain. Advised patient that it is possible that she has developed new hemorrhoids/fissure. Advised patient that she can try Recticare OTC and may cut back on Colace to once a day to see if that helps. Advised that she needs to keep appt as scheduled so they can do an assessment. Advised pt to let us know if she notices an increase in bleeding. Patient verbalized understanding and had no concerns at the end of the call.

## 2020-10-01 NOTE — Telephone Encounter (Signed)
Patient stated that she had a lot of diarrhea over the weekend and is currently having a really sore rectum so much that he can have a bowel movement and that she is having a little rectal bleeding while straining she is trying to have a BM. She wants to know regarding if there is something she can have in the meantime to help? I made an appointment for 8-24 with Janett Billow

## 2020-10-07 ENCOUNTER — Other Ambulatory Visit: Payer: Self-pay | Admitting: Family Medicine

## 2020-10-10 ENCOUNTER — Encounter: Payer: Self-pay | Admitting: Gastroenterology

## 2020-10-10 ENCOUNTER — Ambulatory Visit: Payer: Medicare PPO | Admitting: Gastroenterology

## 2020-10-10 VITALS — BP 130/68 | HR 76 | Ht 60.0 in | Wt 108.4 lb

## 2020-10-10 DIAGNOSIS — K625 Hemorrhage of anus and rectum: Secondary | ICD-10-CM

## 2020-10-10 DIAGNOSIS — K6289 Other specified diseases of anus and rectum: Secondary | ICD-10-CM

## 2020-10-10 NOTE — Progress Notes (Signed)
10/10/2020 [name] [id number] [birth year]   HISTORY OF PRESENT ILLNESS: This is a pleasant 87 year old female who is a patient of Dr. Vivia Ewing.  She has been seen and evaluated for rectal bleeding in the past.  Had a full colonoscopy in 2020 and then a flexible sigmoidoscopy in 2021.  Flexible sigmoidoscopy in April 2021 showed a single/solitary ulcer in the distal rectum.  She is here today again with complaints of rectal bleeding.  She tells me that she was having rectal bleeding and passed a couple of clots last week.  She was having a little discomfort in her bottom as well.  She has been using Preparation H.  She says that she tends to keep her stools soft as she has had issues with constipation and straining in the past.  She has not seen any blood in the past few days.  She states that prior to the onset of the bleeding she had had several days of diarrhea/very loose stools so wonders if maybe that set off the bleeding.   Past Medical History:  Diagnosis Date   Arthralgia of multiple joints    followed by dr Gerilyn Nestle   Arthritis    Cardiomyopathy ([place])    Chronic constipation    Chronic inflammatory arthritis    rhemotolgist-  dr a. Gerilyn Nestle ([facility])   Dry eyes    eye drops used    GERD (gastroesophageal reflux disease)    H/O discoid lupus erythematosus    Hiatal hernia    History of colon polyps    Hypothyroidism    Iron deficiency anemia    LBBB (left bundle branch block) 2010   Mitchell's disease (erythromelalgia) [facility])    neurologist-  dr patel   Nocturia    Non-small cell cancer of right lung [facility]) surgeon-- dr gerhardt/  oncologist-  dr Julien Nordmann--- per lov notes no recurrence/   11-18-2017 per pt denies any symptoms   dx 2015--  Stage IIA (T2b,N0,M0) , +EGFR  mutation in exon 21, non-small cell adenocarcinoma right upper lobe---  s/p  Right upper lobectomy , right middley wedge resection and node dissection---  no chemo or radiation therapy    OA (osteoarthritis)    hands   Osteoporosis    PONV (postoperative nausea and vomiting)    likes phenergan   Raynaud's phenomenon 1965   Renal insufficiency    Rheumatoid arthritis ([place])    Sciatica    Scoliosis    Sjogren's syndrome [facility])    Past Surgical History:  Procedure Laterality Date   ANTERIOR HIP REVISION Right 11/27/2017   Procedure: RIGHT HIP ACETABULAR REVISION;  Surgeon: [name], MD;  Location: [facility];  Service: Orthopedics;  Laterality: Right;   ANTERIOR HIP REVISION Right 01/24/2018   Procedure: OPEN REDUCTION OF DISLOCATED ANTERIOR HIP WITH REVISION OF LINER AND HIP BALL;  Surgeon: [name], MD;  Location: [facility];  Service: Orthopedics;  Laterality: Right;   APPENDECTOMY  1950s   BIOPSY  04/14/2018   Procedure: BIOPSY;  Surgeon: Yetta Flock, MD;  Location: [place];  Service: Gastroenterology;;   BIOPSY  04/16/2018   Procedure: BIOPSY;  Surgeon: Irving Copas., MD;  Location: [place];  Service: Gastroenterology;;   CARDIOVASCULAR STRESS TEST  12/2008    mild fixed basal to mid septal perfusion defect felt likely due to artifact from LBBB, no ischemia, EF 58%   COLONOSCOPY     COLONOSCOPY WITH PROPOFOL N/A 04/16/2018  Procedure: COLONOSCOPY WITH PROPOFOL;  Surgeon: Mansouraty, Telford Nab., MD;  Location: [place];  Service: Gastroenterology;  Laterality: N/A;   ESOPHAGOGASTRODUODENOSCOPY (EGD) WITH PROPOFOL N/A 04/14/2018   Procedure: ESOPHAGOGASTRODUODENOSCOPY (EGD) WITH PROPOFOL;  Surgeon: Yetta Flock, MD;  Location: [place];  Service: Gastroenterology;  Laterality: N/A;   FEMORAL-POPLITEAL BYPASS GRAFT Right 04/10/2018   Procedure: REPAIR RIGHT FEMORAL ARTERY PSEUDOANEURYSM, RETROPERITONEAL EXPOSURE OF ILIAC ARTERY, RIGHT POPLITEAL EMBOLECTOMY;  Surgeon: Angelia Mould, MD;  Location: [place];  Service: Vascular;  Laterality: Right;   FLEXIBLE SIGMOIDOSCOPY N/A 06/17/2019   Procedure:  FLEXIBLE SIGMOIDOSCOPY;  Surgeon: Lavena Bullion, DO;  [facility];  Service: Gastroenterology;  Laterality: N/A;   HEMOSTASIS CLIP PLACEMENT  06/17/2019   Procedure: HEMOSTASIS CLIP PLACEMENT;  Surgeon: Lavena Bullion, DO;  [facility];  Service: Gastroenterology;;   LYMPH NODE DISSECTION Right 06/07/2013   Procedure: LYMPH NODE DISSECTION;  Surgeon: [name], MD;  Location: [place];  Service: Thoracic;  Laterality: Right;   PATCH ANGIOPLASTY Right 04/10/2018   Procedure: PATCH  ANGIOPLASTY OF RIGHT FEMORAL ARTERY USING BOVINE PATCH, PATCH ANGIOPLASTY OF RIGHT POPLITEAL ARTERY USING BOVINE PATCH;  Surgeon: Angelia Mould, MD;  Location: [place];  Service: Vascular;  Laterality: Right;   SCHLEROTHERAPY  06/17/2019   Procedure: [place] Ku OF VARICES;  Surgeon: Lavena Bullion, DO;  [facility];  Service: Gastroenterology;;   [place]   "large incision from chest to up to shoulder, the nerves were tied together, for raynaud's   THORACOTOMY  06/07/2013   Procedure: MINI/LIMITED THORACOTOMY; right middle lobe wedge resection;  Surgeon: [name], MD;  Location: [place];  Service: Thoracic;;   TONSILLECTOMY  child   TOTAL ABDOMINAL HYSTERECTOMY  1980's    W/ BSO   TOTAL HIP ARTHROPLASTY Right 04/28/2014   Procedure: RIGHT TOTAL HIP ARTHROPLASTY ANTERIOR APPROACH;  Surgeon: [name], MD;  [facility];  Service: Orthopedics;  Laterality: Right;   TRANSTHORACIC ECHOCARDIOGRAM  12/11/2008   ef 19-75%, grade 1 diastolic dysfunction/  mild LAE/  mild AR and MR/  trivial TR   VIDEO ASSISTED THORACOSCOPY (VATS)/WEDGE RESECTION Right 06/07/2013   Procedure: VIDEO ASSISTED THORACOSCOPY (VATS)/right upper lobectomy, On Q;  Surgeon: [name], MD;  Location: [place];  Service: Thoracic;  Laterality: Right;   VIDEO BRONCHOSCOPY N/A 06/07/2013   Procedure: VIDEO BRONCHOSCOPY;  Surgeon: [name], MD;   Location: [place];  Service: Thoracic;  Laterality: N/A;   VIDEO BRONCHOSCOPY WITH ENDOBRONCHIAL NAVIGATION N/A 05/04/2013   Procedure: VIDEO BRONCHOSCOPY WITH ENDOBRONCHIAL NAVIGATION;  Surgeon: [name], MD;  Location: [place];  Service: Thoracic;  Laterality: N/A;    reports that she has never smoked. She has never used smokeless tobacco. She reports that she does not currently use alcohol. She reports that she does not use drugs. family history includes Cancer in her father; Colon cancer in her father; Coronary artery disease in her father; Diabetes in her father; Healthy in her brother, daughter, daughter, and sister; Hypothyroidism in her daughter; Other in her brother; Other (age of onset: 65) in her mother; Pneumonia in her sister. Allergies  Allergen Reactions   Amlodipine Rash   Prochlorperazine Edisylate Anaphylaxis    Compazine--- tongue swells and rash   Aspirin Other (See Comments)    nose bleeds. Cannot take NSAIDS    Cymbalta [Duloxetine Hcl] Diarrhea, Nausea And Vomiting and Other (See Comments)    Increased blood pressure  Pamelor [Nortriptyline Hcl] Diarrhea and Nausea Only    Increased Heart rate and BP      Outpatient Encounter Medications as of 10/10/2020  Medication Sig   Abatacept (ORENCIA IV) Inject into the vein every 30 (thirty) days.   Acetaminophen (TYLENOL EXTRA STRENGTH PO) Take 1-2 tablets by mouth every 6 (six) hours as needed (pain).    Acetaminophen-Codeine 300-30 MG tablet TAKE 1-2 TABLETS BY MOUTH EVERY 8 (EIGHT) HOURS AS NEEDED FOR MODERATE PAIN.   albuterol (VENTOLIN HFA) 108 (90 Base) MCG/ACT inhaler TAKE 2 PUFFS BY MOUTH EVERY 6 HOURS AS NEEDED FOR WHEEZE OR SHORTNESS OF BREATH   Biotin 1000 MCG tablet Take 1,000 mcg by mouth daily.   Calcium Carbonate-Vitamin D 500-125 MG-UNIT TABS Take 1 tablet by mouth daily.   carvedilol (COREG) 3.125 MG tablet TAKE 1 TABLET (3.125 MG TOTAL) BY MOUTH 2 (TWO) TIMES DAILY.   denosumab (PROLIA) 60 MG/ML SOSY  injection Inject 60 mg into the skin every 6 (six) months.   docusate sodium (COLACE) 100 MG capsule Take 100 mg by mouth 2 (two) times daily.   gabapentin (NEURONTIN) 300 MG capsule TAKE 1 CAPSULE BY MOUTH IN THE MORNING, 1 IN THE AFTERNOON, AND 2 AT BEDTIME   Glucosamine-Chondroit-Vit C-Mn (GLUCOSAMINE CHONDR 1500 COMPLX PO) Take 1 capsule by mouth daily.   levothyroxine (SYNTHROID) 75 MCG tablet TAKE 1 TABLET (75 MCG TOTAL) BY MOUTH DAILY BEFORE BREAKFAST.   methocarbamol (ROBAXIN) 500 MG tablet TAKE 1 TABLET BY MOUTH EVERY 6 HOURS AS NEEDED FOR MUSCLE SPASMS.   Multiple Vitamin (MULTIVITAMIN) tablet Take 1 tablet by mouth daily.   omeprazole (PRILOSEC) 20 MG capsule TAKE 1 CAPSULE BY MOUTH EVERY DAY   Probiotic Product (PROBIOTIC PO) Take 1 capsule by mouth daily.    sulfamethoxazole-trimethoprim (BACTRIM DS) 800-160 MG tablet Take 1 tablet by mouth 2 (two) times daily.   VITAMIN D PO Take by mouth daily.   No facility-administered encounter medications on file as of 10/10/2020.    REVIEW OF SYSTEMS  : All other systems reviewed and negative except where noted in the History of Present Illness.   PHYSICAL EXAM: BP 130/68   Pulse 76   Ht 5' (1.524 m)   Wt 108 lb 6 oz (49.2 kg)   BMI 21.17 kg/m  General: Well developed white female in no acute distress Head: Normocephalic and atraumatic Eyes:  Sclerae anicteric, conjunctiva pink. Ears: Normal auditory acuity Lungs: Clear throughout to auscultation; no W/R/R. Heart: Regular rate and rhythm; no M/R/G. Abdomen: Soft, non-distended.  BS present.  Non-tender Rectal:  No external abnormalities.  DRE revealed a lot of soft stool in the rectal vault.  No pain.  Stool was dark brown, heme negative.  Anoscopy not attempted due to stool on exam. Musculoskeletal: Symmetrical with no gross deformities  Skin: No lesions on visible extremities Extremities: No edema  Neurological: Alert oriented x 4, grossly non-focal Psychological:  Alert and  cooperative. Normal mood and affect  ASSESSMENT AND PLAN: *Rectal bleeding: From what she describes sounds like this was anorectal source, certainly could be a recurrent internal hemorrhoid.  Anoscopy not performed due to large amount of soft stool in the rectal vault.  Stool is heme-negative today.  She just had a full colonoscopy in 2020 and a flexible sigmoidoscopy 1 year ago.  I do not think that this represents anything bad.  She does keep her stools soft and although there was a lot of stool in the rectal vault it was  very soft so I do not think that this bleeding was related to hard stools/fecal impaction/stercoral ulcer.  She has not had any bleeding in the last few days.  I think she can continue to use her over-the-counter Preparation H or hydrocortisone topical as needed we will continue with observation for now.  She will call us back for any recurrent/worsening symptoms despite the regimens we discussed.   CC:  Copland, Gay Filler, MD

## 2020-10-10 NOTE — Patient Instructions (Addendum)
It was my pleasure to provide care to you today. Based on our discussion, I am providing you with my recommendations below:  RECOMMENDATION(S):   I did not make any changes to your regimen  FOLLOW UP:  I would like for you to follow up with me as need for any recurrent or worsening symptoms. Please call the office at [phone] to schedule your appointment.  BMI:  If you are age 85 or older, your body mass index should be between 23-30. Your There is no height or weight on file to calculate BMI. If this is out of the aforementioned range listed, please consider follow up with your Primary Care Provider.  MY CHART:  The [place] [facility] providers would like to encourage you to use [facility] to communicate with providers for non-urgent requests or questions.  Due to long hold times on the telephone, sending your provider a message by [employer] may be a faster and more efficient way to get a response.  Please allow 48 business hours for a response.  Please remember that this is for non-urgent requests.   Thank you for trusting me with your gastrointestinal care!    Alonza Bogus PA-C

## 2020-10-11 NOTE — Progress Notes (Signed)
Agree with the assessment and plan as outlined by Jessica Zehr, PA-C. ? ?Jewelle Whitner, DO, FACG ? ?

## 2020-10-12 ENCOUNTER — Inpatient Hospital Stay: Payer: Medicare PPO

## 2020-10-15 ENCOUNTER — Other Ambulatory Visit: Payer: Self-pay

## 2020-10-15 ENCOUNTER — Inpatient Hospital Stay: Payer: Medicare PPO | Attending: Internal Medicine

## 2020-10-15 ENCOUNTER — Ambulatory Visit (HOSPITAL_COMMUNITY)
Admission: RE | Admit: 2020-10-15 | Discharge: 2020-10-15 | Disposition: A | Discharge status: Home / Self Care | Payer: Medicare PPO | Source: Ambulatory Visit | Attending: Internal Medicine | Admitting: Internal Medicine

## 2020-10-15 DIAGNOSIS — D509 Iron deficiency anemia, unspecified: Secondary | ICD-10-CM | POA: Diagnosis not present

## 2020-10-15 DIAGNOSIS — J439 Emphysema, unspecified: Secondary | ICD-10-CM | POA: Diagnosis not present

## 2020-10-15 DIAGNOSIS — C3490 Malignant neoplasm of unspecified part of unspecified bronchus or lung: Secondary | ICD-10-CM | POA: Diagnosis not present

## 2020-10-15 DIAGNOSIS — I700 Atherosclerosis of aorta: Secondary | ICD-10-CM | POA: Diagnosis not present

## 2020-10-15 DIAGNOSIS — Z85118 Personal history of other malignant neoplasm of bronchus and lung: Secondary | ICD-10-CM | POA: Insufficient documentation

## 2020-10-15 DIAGNOSIS — R911 Solitary pulmonary nodule: Secondary | ICD-10-CM | POA: Diagnosis not present

## 2020-10-15 DIAGNOSIS — R918 Other nonspecific abnormal finding of lung field: Secondary | ICD-10-CM | POA: Diagnosis not present

## 2020-10-15 LAB — CBC WITH DIFFERENTIAL (CANCER CENTER ONLY)
Abs Immature Granulocytes: 0.01 10*3/uL (ref 0.00–0.07)
Basophils Absolute: 0.1 10*3/uL (ref 0.0–0.1)
Basophils Relative: 1 %
Eosinophils Absolute: 0 10*3/uL (ref 0.0–0.5)
Eosinophils Relative: 1 %
HCT: 33.2 % — ABNORMAL LOW (ref 36.0–46.0)
Hemoglobin: 11.1 g/dL — ABNORMAL LOW (ref 12.0–15.0)
Immature Granulocytes: 0 %
Lymphocytes Relative: 32 %
Lymphs Abs: 1.4 10*3/uL (ref 0.7–4.0)
MCH: 30.1 pg (ref 26.0–34.0)
MCHC: 33.4 g/dL (ref 30.0–36.0)
MCV: 90 fL (ref 80.0–100.0)
Monocytes Absolute: 0.4 10*3/uL (ref 0.1–1.0)
Monocytes Relative: 10 %
Neutro Abs: 2.4 10*3/uL (ref 1.7–7.7)
Neutrophils Relative %: 56 %
Platelet Count: 165 10*3/uL (ref 150–400)
RBC: 3.69 MIL/uL — ABNORMAL LOW (ref 3.87–5.11)
RDW: 12.9 % (ref 11.5–15.5)
WBC Count: 4.3 10*3/uL (ref 4.0–10.5)
nRBC: 0 % (ref 0.0–0.2)

## 2020-10-15 LAB — FERRITIN: Ferritin: 425 ng/mL — ABNORMAL HIGH (ref 11–307)

## 2020-10-15 LAB — CMP (CANCER CENTER ONLY)
ALT: 13 U/L (ref 0–44)
AST: 26 U/L (ref 15–41)
Albumin: 4.3 g/dL (ref 3.5–5.0)
Alkaline Phosphatase: 41 U/L (ref 38–126)
Anion gap: 9 (ref 5–15)
BUN: 31 mg/dL — ABNORMAL HIGH (ref 8–23)
CO2: 27 mmol/L (ref 22–32)
Calcium: 9.2 mg/dL (ref 8.9–10.3)
Chloride: 97 mmol/L — ABNORMAL LOW (ref 98–111)
Creatinine: 1.16 mg/dL — ABNORMAL HIGH (ref 0.44–1.00)
GFR, Estimated: 46 mL/min — ABNORMAL LOW (ref >60)
Glucose, Bld: 84 mg/dL (ref 70–99)
Potassium: 4.7 mmol/L (ref 3.5–5.1)
Sodium: 133 mmol/L — ABNORMAL LOW (ref 135–145)
Total Bilirubin: 0.3 mg/dL (ref 0.3–1.2)
Total Protein: 7.4 g/dL (ref 6.5–8.1)

## 2020-10-15 LAB — IRON AND TIBC
Iron: 66 ug/dL (ref 41–142)
Saturation Ratios: 23 % (ref 21–57)
TIBC: 293 ug/dL (ref 236–444)
UIBC: 226 ug/dL (ref 120–384)

## 2020-10-15 MED ORDER — IOHEXOL 350 MG/ML SOLN
60.0000 mL | Freq: Once | INTRAVENOUS | Status: AC | PRN
  Administered 2020-10-15: 60 mL via INTRAVENOUS

## 2020-10-16 ENCOUNTER — Inpatient Hospital Stay (HOSPITAL_BASED_OUTPATIENT_CLINIC_OR_DEPARTMENT_OTHER): Payer: Medicare PPO | Admitting: Internal Medicine

## 2020-10-16 ENCOUNTER — Ambulatory Visit: Payer: Medicare PPO | Admitting: Gastroenterology

## 2020-10-16 VITALS — BP 142/67 | HR 75 | Temp 97.4°F | Resp 17 | Ht 60.0 in | Wt 106.2 lb

## 2020-10-16 DIAGNOSIS — D509 Iron deficiency anemia, unspecified: Secondary | ICD-10-CM | POA: Diagnosis not present

## 2020-10-16 DIAGNOSIS — C3490 Malignant neoplasm of unspecified part of unspecified bronchus or lung: Secondary | ICD-10-CM | POA: Diagnosis not present

## 2020-10-16 DIAGNOSIS — Z85118 Personal history of other malignant neoplasm of bronchus and lung: Secondary | ICD-10-CM | POA: Diagnosis not present

## 2020-10-16 NOTE — Progress Notes (Signed)
[place] [phone]   [phone]  OFFICE PROGRESS NOTE  Copland, Gay Filler, MD [address] [place] [address]  DIAGNOSIS: Stage IIA (T2b, N0, M0) non-small cell lung cancer, adenocarcinoma with positive EGFR mutation in exon 21 ([id number]) presented with right upper lobe lung mass diagnosed in March of 2015.  PRIOR THERAPY: Status post right upper lobectomy with wedge resection of the right middle lobe under the care of Dr. Servando Snare on 06/07/2013  CURRENT THERAPY: Observation.  INTERVAL HISTORY: [name] 87 y.o. female returns to the clinic today for follow-up visit accompanied by her daughter [name].  The patient is feeling fine today with no concerning complaints except for mild fatigue.  She denied having any current chest pain, shortness of breath, cough or hemoptysis.  She denied having any fever or chills.  She has no nausea, vomiting, diarrhea or constipation.  She has no headache or visual changes.  She is here today for evaluation with repeat blood work as well as CT scan of the chest for restaging of her disease.  MEDICAL HISTORY: Past Medical History:  Diagnosis Date   Arthralgia of multiple joints    followed by dr Gerilyn Nestle   Arthritis    Cardiomyopathy ([place])    Chronic constipation    Chronic inflammatory arthritis    rhemotolgist-  dr a. Gerilyn Nestle ([facility])   Dry eyes    eye drops used    GERD (gastroesophageal reflux disease)    H/O discoid lupus erythematosus    Hiatal hernia    History of colon polyps    Hypothyroidism    Iron deficiency anemia    LBBB (left bundle branch block) 2010   Mitchell's disease (erythromelalgia) [facility])    neurologist-  dr patel   Nocturia    Non-small cell cancer of right lung [facility]) surgeon-- dr gerhardt/  oncologist-  dr Julien Nordmann--- per lov notes no recurrence/   11-18-2017 per pt denies any symptoms   dx 2015--  Stage IIA (T2b,N0,M0) , +EGFR  mutation in  exon 21, non-small cell adenocarcinoma right upper lobe---  s/p  Right upper lobectomy , right middley wedge resection and node dissection---  no chemo or radiation therapy   OA (osteoarthritis)    hands   Osteoporosis    PONV (postoperative nausea and vomiting)    likes phenergan   Raynaud's phenomenon 1965   Renal insufficiency    Rheumatoid arthritis ([place])    Sciatica    Scoliosis    Sjogren's syndrome ([place])     ALLERGIES:  is allergic to amlodipine, prochlorperazine edisylate, aspirin, cymbalta [duloxetine hcl], and pamelor [nortriptyline hcl].  MEDICATIONS:  Current Outpatient Medications  Medication Sig Dispense Refill   Abatacept (ORENCIA IV) Inject into the vein every 30 (thirty) days.     Acetaminophen (TYLENOL EXTRA STRENGTH PO) Take 1-2 tablets by mouth every 6 (six) hours as needed (pain).      Acetaminophen-Codeine 300-30 MG tablet TAKE 1-2 TABLETS BY MOUTH EVERY 8 (EIGHT) HOURS AS NEEDED FOR MODERATE PAIN. 30 tablet 2   Biotin 1000 MCG tablet Take 1,000 mcg by mouth daily.     Calcium Carbonate-Vitamin D 500-125 MG-UNIT TABS Take 1 tablet by mouth daily.     carvedilol (COREG) 3.125 MG tablet TAKE 1 TABLET (3.125 MG TOTAL) BY MOUTH 2 (TWO) TIMES DAILY. 180 tablet 1   denosumab (PROLIA) 60 MG/ML SOSY injection Inject 60 mg into the skin every  6 (six) months.     docusate sodium (COLACE) 100 MG capsule Take 100 mg by mouth 2 (two) times daily.     gabapentin (NEURONTIN) 300 MG capsule TAKE 1 CAPSULE BY MOUTH IN THE MORNING, 1 IN THE AFTERNOON, AND 2 AT BEDTIME 360 capsule 1   Glucosamine-Chondroit-Vit C-Mn (GLUCOSAMINE CHONDR 1500 COMPLX PO) Take 1 capsule by mouth daily.     levothyroxine (SYNTHROID) 75 MCG tablet TAKE 1 TABLET (75 MCG TOTAL) BY MOUTH DAILY BEFORE BREAKFAST. 90 tablet 1   methocarbamol (ROBAXIN) 500 MG tablet TAKE 1 TABLET BY MOUTH EVERY 6 HOURS AS NEEDED FOR MUSCLE SPASMS. 40 tablet 0   Multiple Vitamin (MULTIVITAMIN) tablet Take 1 tablet by mouth daily.      omeprazole (PRILOSEC) 20 MG capsule TAKE 1 CAPSULE BY MOUTH EVERY DAY 90 capsule 3   Probiotic Product (PROBIOTIC PO) Take 1 capsule by mouth daily.      sulfamethoxazole-trimethoprim (BACTRIM DS) 800-160 MG tablet Take 1 tablet by mouth 2 (two) times daily. 10 tablet 0   VITAMIN D PO Take by mouth daily.     albuterol (VENTOLIN HFA) 108 (90 Base) MCG/ACT inhaler TAKE 2 PUFFS BY MOUTH EVERY 6 HOURS AS NEEDED FOR WHEEZE OR SHORTNESS OF BREATH 6.7 each 2   No current facility-administered medications for this visit.    SURGICAL HISTORY:  Past Surgical History:  Procedure Laterality Date   ANTERIOR HIP REVISION Right 11/27/2017   Procedure: RIGHT HIP ACETABULAR REVISION;  Surgeon: [name], MD;  Location: [facility];  Service: Orthopedics;  Laterality: Right;   ANTERIOR HIP REVISION Right 01/24/2018   Procedure: OPEN REDUCTION OF DISLOCATED ANTERIOR HIP WITH REVISION OF LINER AND HIP BALL;  Surgeon: [name], MD;  Location: [facility];  Service: Orthopedics;  Laterality: Right;   APPENDECTOMY  1950s   BIOPSY  04/14/2018   Procedure: BIOPSY;  Surgeon: Yetta Flock, MD;  Location: [facility];  Service: Gastroenterology;;   BIOPSY  04/16/2018   Procedure: BIOPSY;  Surgeon: Irving Copas., MD;  Location: [facility];  Service: Gastroenterology;;   CARDIOVASCULAR STRESS TEST  12/2008    mild fixed basal to mid septal perfusion defect felt likely due to artifact from LBBB, no ischemia, EF 58%   COLONOSCOPY     COLONOSCOPY WITH PROPOFOL N/A 04/16/2018   Procedure: COLONOSCOPY WITH PROPOFOL;  Surgeon: Irving Copas., MD;  Location: [facility];  Service: Gastroenterology;  Laterality: N/A;   ESOPHAGOGASTRODUODENOSCOPY (EGD) WITH PROPOFOL N/A 04/14/2018   Procedure: ESOPHAGOGASTRODUODENOSCOPY (EGD) WITH PROPOFOL;  Surgeon: Yetta Flock, MD;  Location: [facility];  Service: Gastroenterology;  Laterality: N/A;   FEMORAL-POPLITEAL BYPASS GRAFT  Right 04/10/2018   Procedure: REPAIR RIGHT FEMORAL ARTERY PSEUDOANEURYSM, RETROPERITONEAL EXPOSURE OF ILIAC ARTERY, RIGHT POPLITEAL EMBOLECTOMY;  Surgeon: Angelia Mould, MD;  Location: [facility];  Service: Vascular;  Laterality: Right;   FLEXIBLE SIGMOIDOSCOPY N/A 06/17/2019   Procedure: FLEXIBLE SIGMOIDOSCOPY;  Surgeon: Lavena Bullion, DO;  [facility];  Service: Gastroenterology;  Laterality: N/A;   HEMOSTASIS CLIP PLACEMENT  06/17/2019   Procedure: HEMOSTASIS CLIP PLACEMENT;  Surgeon: Lavena Bullion, DO;  [facility];  Service: Gastroenterology;;   LYMPH NODE DISSECTION Right 06/07/2013   Procedure: LYMPH NODE DISSECTION;  Surgeon: [name], MD;  Location: [facility];  Service: Thoracic;  Laterality: Right;   PATCH ANGIOPLASTY Right 04/10/2018   Procedure: PATCH  ANGIOPLASTY OF RIGHT FEMORAL ARTERY USING BOVINE PATCH, PATCH ANGIOPLASTY OF RIGHT POPLITEAL ARTERY USING  BOVINE PATCH;  Surgeon: Angelia Mould, MD;  Location: [facility];  Service: Vascular;  Laterality: Right;   SCHLEROTHERAPY  06/17/2019   Procedure: [place] Ku OF VARICES;  Surgeon: Lavena Bullion, DO;  [facility];  Service: Gastroenterology;;   [place]   "large incision from chest to up to shoulder, the nerves were tied together, for raynaud's   THORACOTOMY  06/07/2013   Procedure: MINI/LIMITED THORACOTOMY; right middle lobe wedge resection;  Surgeon: [name], MD;  Location: [facility];  Service: Thoracic;;   TONSILLECTOMY  child   TOTAL ABDOMINAL HYSTERECTOMY  1980's    W/ BSO   TOTAL HIP ARTHROPLASTY Right 04/28/2014   Procedure: RIGHT TOTAL HIP ARTHROPLASTY ANTERIOR APPROACH;  Surgeon: [name], MD;  [facility];  Service: Orthopedics;  Laterality: Right;   TRANSTHORACIC ECHOCARDIOGRAM  12/11/2008   ef 68-34%, grade 1 diastolic dysfunction/  mild LAE/  mild AR and MR/  trivial TR   VIDEO ASSISTED THORACOSCOPY (VATS)/WEDGE  RESECTION Right 06/07/2013   Procedure: VIDEO ASSISTED THORACOSCOPY (VATS)/right upper lobectomy, On Q;  Surgeon: [name], MD;  Location: [facility];  Service: Thoracic;  Laterality: Right;   VIDEO BRONCHOSCOPY N/A 06/07/2013   Procedure: VIDEO BRONCHOSCOPY;  Surgeon: [name], MD;  Location: [facility];  Service: Thoracic;  Laterality: N/A;   VIDEO BRONCHOSCOPY WITH ENDOBRONCHIAL NAVIGATION N/A 05/04/2013   Procedure: VIDEO BRONCHOSCOPY WITH ENDOBRONCHIAL NAVIGATION;  Surgeon: [name], MD;  Location: [facility];  Service: Thoracic;  Laterality: N/A;    REVIEW OF SYSTEMS:  A comprehensive review of systems was negative except for: Constitutional: positive for fatigue Respiratory: positive for dyspnea on exertion   PHYSICAL EXAMINATION: General appearance: alert, cooperative, and no distress Head: Normocephalic, without obvious abnormality, atraumatic Neck: no adenopathy, no JVD, supple, symmetrical, trachea midline, and thyroid not enlarged, symmetric, no tenderness/mass/nodules Lymph nodes: Cervical, supraclavicular, and axillary nodes normal. Resp: clear to auscultation bilaterally Back: symmetric, no curvature. ROM normal. No CVA tenderness. Cardio: regular rate and rhythm, S1, S2 normal, no murmur, click, rub or gallop GI: soft, non-tender; bowel sounds normal; no masses,  no organomegaly Extremities: extremities normal, atraumatic, no cyanosis or edema  ECOG PERFORMANCE STATUS: 1 - Symptomatic but completely ambulatory  Blood pressure (!) 142/67, pulse 75, temperature (!) 97.4 F (36.3 C), temperature source Tympanic, resp. rate 17, height 5' (1.524 m), weight 106 lb 3.2 oz (48.2 kg), SpO2 99 %.  LABORATORY DATA: Lab Results  Component Value Date   WBC 4.3 10/15/2020   HGB 11.1 (L) 10/15/2020   HCT 33.2 (L) 10/15/2020   MCV 90.0 10/15/2020   PLT 165 10/15/2020      Chemistry      Component Value Date/Time   NA 133 (L) 10/15/2020 1308   NA 130 (L) 08/21/2016  1125   K 4.7 10/15/2020 1308   K 4.8 08/21/2016 1125   CL 97 (L) 10/15/2020 1308   CO2 27 10/15/2020 1308   CO2 26 08/21/2016 1125   BUN 31 (H) 10/15/2020 1308   BUN 25.6 08/21/2016 1125   CREATININE 1.16 (H) 10/15/2020 1308   CREATININE 0.98 (H) 05/21/2020 1341   CREATININE 1.2 (H) 08/21/2016 1125      Component Value Date/Time   CALCIUM 9.2 10/15/2020 1308   CALCIUM 10.0 08/21/2016 1125   ALKPHOS 41 10/15/2020 1308   ALKPHOS 54 08/21/2016 1125   AST 26 10/15/2020 1308   AST 27 08/21/2016 1125   ALT 13 10/15/2020  1308   ALT 13 08/21/2016 1125   BILITOT 0.3 10/15/2020 1308   BILITOT 0.31 08/21/2016 1125       RADIOGRAPHIC STUDIES: CT Chest W Contrast  Result Date: 10/16/2020 CLINICAL DATA:  Primary Cancer Type: Lung Imaging Indication: Routine surveillance Interval therapy since last imaging? No Initial Cancer Diagnosis Date: 05/04/2013; Established by: Biopsy-proven Detailed Pathology: Stage IIA non-small cell lung cancer, adenocarcinoma. Primary Tumor location: Right upper lobe. Surgeries: Right upper lobectomy and wedge resection of right middle lobe 06/07/2013. Chemotherapy: No Immunotherapy? No Radiation therapy? No EXAM: CT CHEST WITH CONTRAST TECHNIQUE: Multidetector CT imaging of the chest was performed during intravenous contrast administration. CONTRAST:  41mL OMNIPAQUE IOHEXOL 350 MG/ML SOLN COMPARISON:  Most recent CT chest 06/05/2020.  04/22/2013 PET-CT. FINDINGS: Cardiovascular: The heart size is normal. No substantial pericardial effusion. Mild atherosclerotic calcification is noted in the wall of the thoracic aorta. Mediastinum/Nodes: No mediastinal lymphadenopathy. There is no hilar lymphadenopathy. Moderate to large hiatal hernia. The esophagus has normal imaging features. There is no axillary lymphadenopathy. Lungs/Pleura: Centrilobular emphsyema noted. Architectural distortion and scarring in the right middle lobe is compatible with the reported history of wedge  resection. Surgical changes again noted consistent with right upper lobectomy. Parenchymal scarring in the right apex is similar to prior. Bilateral pulmonary nodules again noted index posterior right lung nodule measured at 9 mm previously is 9 mm again today on 65/7. Index posterior right lower lobe nodule measured previously at 5 mm is 6 mm today on 87/7. Irregular 10 mm paraspinal right lower lobe nodule measured previously is 10 mm again today (99/7). The 5 mm posterior left lower lobe nodule noted previously is 6 mm today on 106/7. No definite new pulmonary nodule or mass. No focal airspace consolidation. No pleural effusion. Upper Abdomen: Cortical scarring noted left kidney. Tiny hypervascular lesion in the right liver is stable (116/2) reportedly since 2021. Musculoskeletal: No worrisome lytic or sclerotic osseous abnormality. Degenerative changes noted in both shoulders. IMPRESSION: 1. Numerous bilateral pulmonary nodules again identified, suspicious for metastatic disease. These are stable to possibly minimally increased in the interval. No new pulmonary nodule or mass identified. 2. Moderate to large hiatal hernia. 3.  Emphysema (ICD10-J43.9) and Aortic Atherosclerosis (ICD10-170.0) Electronically Signed   By: [name] M.D.   On: 10/16/2020 10:07    ASSESSMENT AND PLAN:  This is a very pleasant 87 years old white female with stage IIA non-small cell lung cancer, adenocarcinoma with positive EGFR mutation in exon 21 status post right upper lobectomy as well as wedge resection of the right middle lobe in March 2015 and the patient declined adjuvant systemic chemotherapy. The patient has been on observation since that time and she is feeling fine today with no concerning complaints. The patient had repeat CT scan of the chest performed recently.  I personally and independently reviewed the scan images and discussed the results with the patient and her daughter. Her scan showed the persistent  numerous bilateral pulmonary nodules again suspicious for metastatic disease but they are stable to minimally increased in the interval. I had a lengthy discussion with the patient today about her condition and treatment options.  I offered her the option of treatment with targeted therapy versus continuous observation and monitoring. She would like to continue on observation for now. I will see her back for follow-up visit in 6 months for evaluation and repeat CT scan of the chest for restaging of her disease. For the iron deficiency anemia, she will continue  on the oral iron tablets for now. The patient was advised to call immediately if she has any other concerning symptoms in the interval.  The patient voices understanding of current disease status and treatment options and is in agreement with the current care plan. All questions were answered. The patient knows to call the clinic with any problems, questions or concerns. We can certainly see the patient much sooner if necessary.  Disclaimer: This note was dictated with voice recognition software. Similar sounding words can inadvertently be transcribed and may not be corrected upon review.

## 2020-10-18 ENCOUNTER — Encounter: Payer: Self-pay | Admitting: Family Medicine

## 2020-10-24 ENCOUNTER — Other Ambulatory Visit: Payer: Self-pay

## 2020-10-24 ENCOUNTER — Other Ambulatory Visit: Payer: Self-pay | Admitting: Family Medicine

## 2020-10-24 ENCOUNTER — Ambulatory Visit (HOSPITAL_COMMUNITY)
Admission: RE | Admit: 2020-10-24 | Discharge: 2020-10-24 | Disposition: A | Discharge status: Home / Self Care | Payer: Medicare PPO | Source: Ambulatory Visit | Attending: Rheumatology | Admitting: Rheumatology

## 2020-10-24 DIAGNOSIS — M0609 Rheumatoid arthritis without rheumatoid factor, multiple sites: Secondary | ICD-10-CM | POA: Insufficient documentation

## 2020-10-24 DIAGNOSIS — M2550 Pain in unspecified joint: Secondary | ICD-10-CM

## 2020-10-24 MED ORDER — ACETAMINOPHEN 325 MG PO TABS: 650.0000 mg | ORAL_TABLET | ORAL | Status: DC

## 2020-10-24 MED ORDER — DIPHENHYDRAMINE HCL 25 MG PO CAPS: 25.0000 mg | ORAL_CAPSULE | ORAL | Status: DC

## 2020-10-24 MED ORDER — SODIUM CHLORIDE 0.9 % IV SOLN
500.0000 mg | INTRAVENOUS | Status: DC
  Administered 2020-10-24: 500 mg via INTRAVENOUS
  Filled 2020-10-24: qty 20

## 2020-11-01 ENCOUNTER — Encounter: Payer: Self-pay | Admitting: Family Medicine

## 2020-11-04 ENCOUNTER — Encounter: Payer: Self-pay | Admitting: Family Medicine

## 2020-11-05 ENCOUNTER — Other Ambulatory Visit: Payer: Self-pay

## 2020-11-05 ENCOUNTER — Ambulatory Visit: Payer: Medicare PPO | Admitting: Family Medicine

## 2020-11-05 VITALS — BP 120/80 | HR 71 | Temp 97.8°F | Resp 18 | Ht 60.0 in | Wt 106.0 lb

## 2020-11-05 DIAGNOSIS — M25512 Pain in left shoulder: Secondary | ICD-10-CM

## 2020-11-05 DIAGNOSIS — G8929 Other chronic pain: Secondary | ICD-10-CM

## 2020-11-05 DIAGNOSIS — B029 Zoster without complications: Secondary | ICD-10-CM | POA: Diagnosis not present

## 2020-11-05 MED ORDER — VALACYCLOVIR HCL 1 G PO TABS: 1000.0000 mg | ORAL_TABLET | Freq: Every day | ORAL | 0 refills | Status: DC

## 2020-11-05 MED ORDER — TRAMADOL HCL 50 MG PO TABS: 25.0000 mg | ORAL_TABLET | Freq: Two times a day (BID) | ORAL | 0 refills | Status: DC | PRN

## 2020-11-05 NOTE — Progress Notes (Signed)
[facility] [address] [place], [place] [address] [phone]  Date:  11/05/2020   Name:  [name]   DOB:  03-03-1930   MRN:  [id number]  PCP:  Darreld Mclean, MD    Chief Complaint: Rash (Left shoulder that burns, itching, and some pain hurts. Pt is concerned for Shingles. Started last Thursday. Itching is now all over. Daughter has pictures. )   History of Present Illness:  [name] is a 87 y.o. very pleasant female patient who presents with the following:  Pt seen today with concern of a rash- she is worried it might be shingles  The rash appeared last Thursday- today is Monday.  She had used some voltaren gel on the area, but also on some other locations on her body the same day- no other rash appeared   The rash has evolved some over the last few days and she shows me photos- possible mild shingles The area feels itchy, tender and tingly   She has chronic pain from her shoulders and hips  We tried tylenol #3 but not that helpful - we discussed trying tramadol and she is willing to do this   She had used some voltaren on her bilateral shoulders     Patient Active Problem List   Diagnosis Date Noted   Rectal pain 10/10/2020   Acute posthemorrhagic anemia    Rectal bleeding 06/17/2019   Grade II internal hemorrhoids    Rectal ulcer    Popliteal artery occlusion, right ([place]) 04/10/2018   HTN (hypertension) 04/10/2018   Femoral artery pseudo-aneurysm, right ([place]) 04/09/2018   Anemia of chronic disease 02/24/2018   Unstable right hip arthroplasty 01/24/2018   History of revision of total replacement of right hip joint 01/24/2018   Hypovolemic shock ([place])    Hyperkalemia    Hyponatremia    Failed total hip arthroplasty ([place]) 11/27/2017   Status post revision of total hip 11/27/2017   Elevated cholesterol 10/11/2015   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[place]) 10/04/2014   Bilateral leg edema  08/01/2014   Elevated BP 08/01/2014   Rheumatoid arthritis involving multiple joints ([place]) 05/30/2014   Status post total replacement of right hip 04/28/2014   Long-term use of high-risk medication 11/11/2013   Symptomatic anemia 11/11/2013   Neuropathic pain 07/07/2013   Constipation due to pain medication 07/07/2013   Protein-calorie malnutrition, severe ([place]) 06/08/2013   Lung cancer, Right upper lobe 05/08/2013   Sciatica of right side 08/13/2011   Osteoporosis 03/07/2010   PARESTHESIA 03/07/2010   CT, CHEST, ABNORMAL 12/18/2008   ABNORMAL ECHOCARDIOGRAM 12/14/2008   SJOGREN'S SYNDROME 11/29/2008   HYPOGLYCEMIA 06/29/2006   RAYNAUD'S DISEASE 06/29/2006    Past Medical History:  Diagnosis Date   Arthralgia of multiple joints    followed by dr Gerilyn Nestle   Arthritis    Cardiomyopathy ([place])    Chronic constipation    Chronic inflammatory arthritis    rhemotolgist-  dr a. Gerilyn Nestle ([facility])   Dry eyes    eye drops used    GERD (gastroesophageal reflux disease)    H/O discoid lupus erythematosus    Hiatal hernia    History of colon polyps    Hypothyroidism    Iron deficiency anemia    LBBB (left bundle branch block) 2010   Mitchell's disease (erythromelalgia) [facility])    neurologist-  dr patel   Nocturia    Non-small cell cancer of  right lung [facility]) surgeon-- dr gerhardt/  oncologist-  dr Julien Nordmann--- per lov notes no recurrence/   11-18-2017 per pt denies any symptoms   dx 2015--  Stage IIA (T2b,N0,M0) , +EGFR  mutation in exon 21, non-small cell adenocarcinoma right upper lobe---  s/p  Right upper lobectomy , right [place] wedge resection and node dissection---  no chemo or radiation therapy   OA (osteoarthritis)    hands   Osteoporosis    PONV (postoperative nausea and vomiting)    likes phenergan   Raynaud's phenomenon 1965   Renal insufficiency    Rheumatoid arthritis ([place])    Sciatica    Scoliosis    Sjogren's syndrome [facility])     Past Surgical History:   Procedure Laterality Date   ANTERIOR HIP REVISION Right 11/27/2017   Procedure: RIGHT HIP ACETABULAR REVISION;  Surgeon: [name], MD;  Location: [facility];  Service: Orthopedics;  Laterality: Right;   ANTERIOR HIP REVISION Right 01/24/2018   Procedure: OPEN REDUCTION OF DISLOCATED ANTERIOR HIP WITH REVISION OF LINER AND HIP BALL;  Surgeon: [name], MD;  Location: [facility];  Service: Orthopedics;  Laterality: Right;   APPENDECTOMY  1950s   BIOPSY  04/14/2018   Procedure: BIOPSY;  Surgeon: Yetta Flock, MD;  Location: [place];  Service: Gastroenterology;;   BIOPSY  04/16/2018   Procedure: BIOPSY;  Surgeon: Irving Copas., MD;  Location: [place];  Service: Gastroenterology;;   CARDIOVASCULAR STRESS TEST  12/2008    mild fixed basal to mid septal perfusion defect felt likely due to artifact from LBBB, no ischemia, EF 58%   COLONOSCOPY     COLONOSCOPY WITH PROPOFOL N/A 04/16/2018   Procedure: COLONOSCOPY WITH PROPOFOL;  Surgeon: Irving Copas., MD;  Location: [place];  Service: Gastroenterology;  Laterality: N/A;   ESOPHAGOGASTRODUODENOSCOPY (EGD) WITH PROPOFOL N/A 04/14/2018   Procedure: ESOPHAGOGASTRODUODENOSCOPY (EGD) WITH PROPOFOL;  Surgeon: Yetta Flock, MD;  Location: [place];  Service: Gastroenterology;  Laterality: N/A;   FEMORAL-POPLITEAL BYPASS GRAFT Right 04/10/2018   Procedure: REPAIR RIGHT FEMORAL ARTERY PSEUDOANEURYSM, RETROPERITONEAL EXPOSURE OF ILIAC ARTERY, RIGHT POPLITEAL EMBOLECTOMY;  Surgeon: Angelia Mould, MD;  Location: [place];  Service: Vascular;  Laterality: Right;   FLEXIBLE SIGMOIDOSCOPY N/A 06/17/2019   Procedure: FLEXIBLE SIGMOIDOSCOPY;  Surgeon: Lavena Bullion, DO;  [facility];  Service: Gastroenterology;  Laterality: N/A;   HEMOSTASIS CLIP PLACEMENT  06/17/2019   Procedure: HEMOSTASIS CLIP PLACEMENT;  Surgeon: Lavena Bullion, DO;  [facility];  Service:  Gastroenterology;;   LYMPH NODE DISSECTION Right 06/07/2013   Procedure: LYMPH NODE DISSECTION;  Surgeon: [name], MD;  Location: [place];  Service: Thoracic;  Laterality: Right;   PATCH ANGIOPLASTY Right 04/10/2018   Procedure: PATCH  ANGIOPLASTY OF RIGHT FEMORAL ARTERY USING BOVINE PATCH, PATCH ANGIOPLASTY OF RIGHT POPLITEAL ARTERY USING BOVINE PATCH;  Surgeon: Angelia Mould, MD;  Location: [place];  Service: Vascular;  Laterality: Right;   SCHLEROTHERAPY  06/17/2019   Procedure: [place] OF VARICES;  Surgeon: Lavena Bullion, DO;  [facility];  Service: Gastroenterology;;   [place]   "large incision from chest to up to shoulder, the nerves were tied together, for raynaud's   THORACOTOMY  06/07/2013   Procedure: MINI/LIMITED THORACOTOMY; right middle lobe wedge resection;  Surgeon: [name], MD;  Location: [place];  Service: Thoracic;;   TONSILLECTOMY  child   TOTAL ABDOMINAL HYSTERECTOMY  1980's    W/ BSO  TOTAL HIP ARTHROPLASTY Right 04/28/2014   Procedure: RIGHT TOTAL HIP ARTHROPLASTY ANTERIOR APPROACH;  Surgeon: [name], MD;  [facility];  Service: Orthopedics;  Laterality: Right;   TRANSTHORACIC ECHOCARDIOGRAM  12/11/2008   ef 22-97%, grade 1 diastolic dysfunction/  mild LAE/  mild AR and MR/  trivial TR   VIDEO ASSISTED THORACOSCOPY (VATS)/WEDGE RESECTION Right 06/07/2013   Procedure: VIDEO ASSISTED THORACOSCOPY (VATS)/right upper lobectomy, On Q;  Surgeon: [name], MD;  [facility];  Service: Thoracic;  Laterality: Right;   VIDEO BRONCHOSCOPY N/A 06/07/2013   Procedure: VIDEO BRONCHOSCOPY;  Surgeon: [name], MD;  [facility];  Service: Thoracic;  Laterality: N/A;   VIDEO BRONCHOSCOPY WITH ENDOBRONCHIAL NAVIGATION N/A 05/04/2013   Procedure: VIDEO BRONCHOSCOPY WITH ENDOBRONCHIAL NAVIGATION;  Surgeon: [name], MD;  Location[facility];  Service: Thoracic;  Laterality: N/A;     Social History   Tobacco Use   Smoking status: Never   Smokeless tobacco: Never  Vaping Use   Vaping Use: Never used  Substance Use Topics   Alcohol use: Not Currently   Drug use: Never    Family History  Problem Relation Age of Onset   Coronary artery disease Father    Colon cancer Father    Diabetes Father    Cancer Father        colon   Other Mother 16       MVA   Healthy Sister    Healthy Brother    Healthy Daughter    Hypothyroidism Daughter    Other Brother        killed in war   Pneumonia Sister    Healthy Daughter    Esophageal cancer Neg Hx    Kidney disease Neg Hx    Liver disease Neg Hx     Allergies  Allergen Reactions   Amlodipine Rash   Prochlorperazine Edisylate Anaphylaxis    Compazine--- tongue swells and rash   Aspirin Other (See Comments)    nose bleeds. Cannot take NSAIDS    Cymbalta [Duloxetine Hcl] Diarrhea, Nausea And Vomiting and Other (See Comments)    Increased blood pressure   Pamelor [Nortriptyline Hcl] Diarrhea and Nausea Only    Increased Heart rate and BP    Medication list has been reviewed and updated.  Current Outpatient Medications on File Prior to Visit  Medication Sig Dispense Refill   Abatacept (ORENCIA IV) Inject into the vein every 30 (thirty) days.     Acetaminophen (TYLENOL EXTRA STRENGTH PO) Take 1-2 tablets by mouth every 6 (six) hours as needed (pain).      Acetaminophen-Codeine 300-30 MG tablet TAKE 1-2 TABLETS BY MOUTH EVERY 8 (EIGHT) HOURS AS NEEDED FOR MODERATE PAIN. 30 tablet 2   Biotin 1000 MCG tablet Take 1,000 mcg by mouth daily.     Calcium Carbonate-Vitamin D 500-125 MG-UNIT TABS Take 1 tablet by mouth daily.     carvedilol (COREG) 3.125 MG tablet TAKE 1 TABLET (3.125 MG TOTAL) BY MOUTH 2 (TWO) TIMES DAILY. 180 tablet 1   denosumab (PROLIA) 60 MG/ML SOSY injection Inject 60 mg into the skin every 6 (six) months.     docusate sodium (COLACE) 100 MG capsule Take 100 mg by mouth 2 (two) times daily.      gabapentin (NEURONTIN) 300 MG capsule TAKE 1 CAPSULE BY MOUTH IN THE MORNING, 1 IN THE AFTERNOON, AND 2 AT BEDTIME 360 capsule 1   Glucosamine-Chondroit-Vit C-Mn (GLUCOSAMINE CHONDR 1500 COMPLX PO) Take  1 capsule by mouth daily.     levothyroxine (SYNTHROID) 75 MCG tablet TAKE 1 TABLET (75 MCG TOTAL) BY MOUTH DAILY BEFORE BREAKFAST. 90 tablet 1   methocarbamol (ROBAXIN) 500 MG tablet TAKE 1 TABLET BY MOUTH EVERY 6 HOURS AS NEEDED FOR MUSCLE SPASMS. 40 tablet 0   Multiple Vitamin (MULTIVITAMIN) tablet Take 1 tablet by mouth daily.     omeprazole (PRILOSEC) 20 MG capsule TAKE 1 CAPSULE BY MOUTH EVERY DAY 90 capsule 3   Probiotic Product (PROBIOTIC PO) Take 1 capsule by mouth daily.      sulfamethoxazole-trimethoprim (BACTRIM DS) 800-160 MG tablet Take 1 tablet by mouth 2 (two) times daily. 10 tablet 0   VITAMIN D PO Take by mouth daily.     No current facility-administered medications on file prior to visit.    Review of Systems:  As per HPI- otherwise negative.   Physical Examination: Vitals:   11/05/20 1009  BP: 120/80  Pulse: 71  Resp: 18  Temp: 97.8 F (36.6 C)  SpO2: 99%   Vitals:   11/05/20 1009  Weight: 106 lb (48.1 kg)  Height: 5' (1.524 m)   Body mass index is 20.7 kg/m. Ideal Body Weight: Weight in (lb) to have BMI = 25: 127.7  GEN: no acute distress. Elderly lady who looks well, accompanied by her daughter today  HEENT: Atraumatic, Normocephalic.  Ears and Nose: No external deformity. CV: RRR, No M/G/R. No JVD. No thrill. No extra heart sounds. PULM: CTA B, no wheezes, crackles, rhonchi. No retractions. No resp. distress. No accessory muscle use. EXTR: No c/c/e PSYCH: Normally interactive. Conversant.  Left anterior shoulder displays a somewhat diffuse palpable, erythematous rash.  Possible mild shingles.  Some symptoms persist down the left arm in the C3/4 dermatome    Assessment and Plan: Herpes zoster without complication - Plan: valACYclovir (VALTREX)  1000 MG tablet  Chronic pain in left shoulder - Plan: traMADol (ULTRAM) 50 MG tablet  Possible shingles- mild.  Pt is vaccinated Will treat with valtrex dose adjusted for creat clearance of approx 25 Will have her try tramadol for her chronic joint pain.  Discussed safe use and possible SE She will let me know how this works for her  This visit occurred during the SARS-CoV-2 public health emergency.  Safety protocols were in place, including screening questions prior to the visit, additional usage of staff PPE, and extensive cleaning of exam room while observing appropriate contact time as indicated for disinfecting solutions.    Signed Lamar Blinks, MD

## 2020-11-05 NOTE — Patient Instructions (Signed)
Good to see you again today I think you may have shingles as you suspected Take the valtrex once a day for a week for shingles- let me know if not resolving Try a "patch test" on your arm with the voltaren  Try tramadol as needed for pain- use caution regarding sedation!

## 2020-11-07 ENCOUNTER — Ambulatory Visit: Payer: Medicare PPO | Admitting: Orthopaedic Surgery

## 2020-11-07 ENCOUNTER — Telehealth: Payer: Self-pay | Admitting: Rheumatology

## 2020-11-07 NOTE — Telephone Encounter (Signed)
I called patient, patient has 4 more days of medication, patient has shingles, patient verbalized understanding.

## 2020-11-07 NOTE — Telephone Encounter (Signed)
Patient can take receive infusion as long as she has no signs/symptoms of infection.   Knox Saliva, PharmD, MPH, BCPS Clinical Pharmacist (Rheumatology and Pulmonology)

## 2020-11-07 NOTE — Telephone Encounter (Signed)
Patient calling to make sure she is okay to take her infusion on October 5th. Patient will be off antibiotics 10 days before infusion. Please call to advise.

## 2020-11-20 ENCOUNTER — Telehealth: Payer: Self-pay | Admitting: Pharmacist

## 2020-11-20 ENCOUNTER — Other Ambulatory Visit: Payer: Self-pay | Admitting: Pharmacist

## 2020-11-20 DIAGNOSIS — M810 Age-related osteoporosis without current pathological fracture: Secondary | ICD-10-CM

## 2020-11-20 DIAGNOSIS — Z79899 Other long term (current) drug therapy: Secondary | ICD-10-CM

## 2020-11-20 DIAGNOSIS — E559 Vitamin D deficiency, unspecified: Secondary | ICD-10-CM

## 2020-11-20 NOTE — Telephone Encounter (Signed)
Patient due for Prolia on 12/03/20  She receives IV Orencia infusion at [facility] tomorrow, 11/21/20. Lab orders placed for CBC w diff, CMP w GFR, and Vitamin D to be drawn at [facility] prior to infusion tomorrow to prevent patient from making extra trip to the clinic  Knox Saliva, PharmD, MPH, BCPS Clinical Pharmacist (Rheumatology and Pulmonology)

## 2020-11-20 NOTE — Progress Notes (Signed)
Added labs to be drawn with Orencia infusion tomorrow: CBC w diff, CMP w GFR, and Vitamin D.  Patient is due for Prolia on 12/03/20 and these labs will prevent patient from coming to clinic to have labs completed  Knox Saliva, PharmD, MPH, BCPS Clinical Pharmacist (Rheumatology and Pulmonology)

## 2020-11-21 ENCOUNTER — Ambulatory Visit (HOSPITAL_COMMUNITY)
Admission: RE | Admit: 2020-11-21 | Discharge: 2020-11-21 | Disposition: A | Discharge status: Home / Self Care | Payer: Medicare PPO | Source: Ambulatory Visit | Attending: Rheumatology | Admitting: Rheumatology

## 2020-11-21 ENCOUNTER — Other Ambulatory Visit: Payer: Self-pay

## 2020-11-21 ENCOUNTER — Ambulatory Visit (HOSPITAL_COMMUNITY): Payer: Medicare PPO

## 2020-11-21 DIAGNOSIS — E559 Vitamin D deficiency, unspecified: Secondary | ICD-10-CM

## 2020-11-21 DIAGNOSIS — M0609 Rheumatoid arthritis without rheumatoid factor, multiple sites: Secondary | ICD-10-CM

## 2020-11-21 DIAGNOSIS — M810 Age-related osteoporosis without current pathological fracture: Secondary | ICD-10-CM | POA: Insufficient documentation

## 2020-11-21 DIAGNOSIS — Z79899 Other long term (current) drug therapy: Secondary | ICD-10-CM | POA: Diagnosis not present

## 2020-11-21 LAB — COMPREHENSIVE METABOLIC PANEL
ALT: 15 U/L (ref 0–44)
AST: 29 U/L (ref 15–41)
Albumin: 3.9 g/dL (ref 3.5–5.0)
Alkaline Phosphatase: 36 U/L — ABNORMAL LOW (ref 38–126)
Anion gap: 9 (ref 5–15)
BUN: 29 mg/dL — ABNORMAL HIGH (ref 8–23)
CO2: 27 mmol/L (ref 22–32)
Calcium: 9.6 mg/dL (ref 8.9–10.3)
Chloride: 94 mmol/L — ABNORMAL LOW (ref 98–111)
Creatinine, Ser: 1.17 mg/dL — ABNORMAL HIGH (ref 0.44–1.00)
GFR, Estimated: 45 mL/min — ABNORMAL LOW (ref >60)
Glucose, Bld: 97 mg/dL (ref 70–99)
Potassium: 4.8 mmol/L (ref 3.5–5.1)
Sodium: 130 mmol/L — ABNORMAL LOW (ref 135–145)
Total Bilirubin: 0.7 mg/dL (ref 0.3–1.2)
Total Protein: 6.6 g/dL (ref 6.5–8.1)

## 2020-11-21 LAB — CBC WITH DIFFERENTIAL/PLATELET
Abs Immature Granulocytes: 0.01 10*3/uL (ref 0.00–0.07)
Basophils Absolute: 0 10*3/uL (ref 0.0–0.1)
Basophils Relative: 1 %
Eosinophils Absolute: 0.1 10*3/uL (ref 0.0–0.5)
Eosinophils Relative: 1 %
HCT: 34.7 % — ABNORMAL LOW (ref 36.0–46.0)
Hemoglobin: 11.4 g/dL — ABNORMAL LOW (ref 12.0–15.0)
Immature Granulocytes: 0 %
Lymphocytes Relative: 22 %
Lymphs Abs: 0.9 10*3/uL (ref 0.7–4.0)
MCH: 30.2 pg (ref 26.0–34.0)
MCHC: 32.9 g/dL (ref 30.0–36.0)
MCV: 92 fL (ref 80.0–100.0)
Monocytes Absolute: 0.5 10*3/uL (ref 0.1–1.0)
Monocytes Relative: 13 %
Neutro Abs: 2.5 10*3/uL (ref 1.7–7.7)
Neutrophils Relative %: 63 %
Platelets: 186 10*3/uL (ref 150–400)
RBC: 3.77 MIL/uL — ABNORMAL LOW (ref 3.87–5.11)
RDW: 13.2 % (ref 11.5–15.5)
WBC: 4 10*3/uL (ref 4.0–10.5)
nRBC: 0 % (ref 0.0–0.2)

## 2020-11-21 LAB — VITAMIN D 25 HYDROXY (VIT D DEFICIENCY, FRACTURES): Vit D, 25-Hydroxy: 63.17 ng/mL (ref 30–100)

## 2020-11-21 MED ORDER — ACETAMINOPHEN 325 MG PO TABS: 650.0000 mg | ORAL_TABLET | ORAL | Status: DC

## 2020-11-21 MED ORDER — DIPHENHYDRAMINE HCL 25 MG PO CAPS: 25.0000 mg | ORAL_CAPSULE | ORAL | Status: DC

## 2020-11-21 MED ORDER — SODIUM CHLORIDE 0.9 % IV SOLN
500.0000 mg | INTRAVENOUS | Status: DC
  Administered 2020-11-21: 500 mg via INTRAVENOUS
  Filled 2020-11-21: qty 20

## 2020-11-21 NOTE — Progress Notes (Signed)
Vitamin D WNL

## 2020-11-21 NOTE — Progress Notes (Signed)
She remains anemic but hemoglobin continues to trend up.  Rest of CBC WNL.

## 2020-11-21 NOTE — Progress Notes (Signed)
Sodium and chloride are low. Alk phos is borderline low.  Creatinine remains slightly elevated and GFR is low-45.  Please advise the patient to avoid all NSAIDs.  Please forward lab work to PCP.

## 2020-11-22 ENCOUNTER — Encounter: Payer: Self-pay | Admitting: Family Medicine

## 2020-11-22 ENCOUNTER — Ambulatory Visit (HOSPITAL_COMMUNITY): Payer: Medicare PPO

## 2020-11-22 ENCOUNTER — Other Ambulatory Visit: Payer: Self-pay | Admitting: Pharmacist

## 2020-11-22 DIAGNOSIS — M810 Age-related osteoporosis without current pathological fracture: Secondary | ICD-10-CM

## 2020-11-22 NOTE — Telephone Encounter (Signed)
CBC, CMP and Vitamin D drawn on 11/21/20 wnl to proceed with Prolia. Order placed for [facility]. Called patient and advised her to reach out to [facility] as soon as possible to ensure that she is able to receive Prolia on 12/19/20 with her Orencia infusion.  Knox Saliva, PharmD, MPH, BCPS Clinical Pharmacist (Rheumatology and Pulmonology)

## 2020-11-22 NOTE — Progress Notes (Signed)
Next Prolia not yet scheduled and due on 12/03/20. She has Orencia IV infusion already scheduled on 12/19/20 and should be able to receive on the same day as she did with her previous Prolia dose (to avoid extra trip to [facility]). Received both on 06/06/20 and tolerated without issue.  Diagnosis: age-related osteoporosis  Dose: 60mg  every 6 months  Last Clinic Visit: 08/14/20 Next Clinic Visit: 12/18/20  Last injection: 06/06/20  Labs: calcium and vitamin D wnl on 11/21/20  Orders placed for Prolia SQ x 1 dose. No premeds required but will receive premeds with Orencia infusion on 12/19/20.  Called patient and advised her to reach out to [facility] to ensure she can received Prolia on the same day as her Orencia infusion on 12/19/20  Will follow-up to ensured scheduled and completed  Knox Saliva, PharmD, MPH, BCPS Clinical Pharmacist (Rheumatology and Pulmonology)

## 2020-11-29 NOTE — Progress Notes (Signed)
Confirmed with [facility] that patient will receive Prolia on 12/19/20 with Orencia infusion  Knox Saliva, PharmD, MPH, BCPS Clinical Pharmacist (Rheumatology and Pulmonology)

## 2020-12-04 NOTE — Progress Notes (Signed)
Office Visit Note  Patient: [name]             Date of Birth: Jan 30, 1931           MRN: [id number]             PCP: Darreld Mclean, MD Referring: Darreld Mclean, MD Visit Date: 12/18/2020 Occupation: _0 @  Subjective:  Pain in multiple joints.   History of Present Illness: [name] is a 87 y.o. female with a history of rheumatoid arthritis, osteoarthritis and sicca symptoms.  She continues to have dry mouth and dry eyes symptoms for which she has been taking over-the-counter products.  She states she has been having increased pain and discomfort in her shoulders, lower back and her hips.  She has not noticed any joint swelling although she continues to have some discomfort in her hands especially over the DIP joints.  She states her DIP joints have become more prominent over time.  She has been getting Prolia injections for osteoporosis.  She states that she has been getting tramadol and Tylenol for pain management by Dr. Lorelei Pont.  She continues to have discomfort in her right hip joint.  She has an appointment coming up with Dr. Ninfa Linden for evaluation.  She developed recent shingles infection for which she was treated by her PCP.  She received flu and COVID-19 booster last week.  She is scheduled to have Orencia infusion next week.  Activities of Daily Living:  Patient reports morning stiffness for all day. Patient Reports nocturnal pain.  Difficulty dressing/grooming: Reports Difficulty climbing stairs: Reports Difficulty getting out of chair: Denies Difficulty using hands for taps, buttons, cutlery, and/or writing: Reports  Review of Systems  Constitutional:  Negative for fatigue.  HENT:  Positive for nose dryness. Negative for mouth sores and mouth dryness.   Eyes:  Negative for pain, itching and dryness.  Respiratory:  Negative for shortness of breath and difficulty breathing.   Cardiovascular:  Negative for chest pain and palpitations.   Gastrointestinal:  Positive for constipation. Negative for blood in stool and diarrhea.  Endocrine: Negative for increased urination.  Genitourinary:  Negative for difficulty urinating.  Musculoskeletal:  Positive for joint pain, joint pain, joint swelling, myalgias, morning stiffness, muscle tenderness and myalgias.  Skin:  Positive for color change. Negative for rash, redness and sensitivity to sunlight.  Allergic/Immunologic: Negative for susceptible to infections.  Neurological:  Positive for numbness. Negative for dizziness, headaches, memory loss and weakness.  Hematological:  Positive for bruising/bleeding tendency.  Psychiatric/Behavioral:  Negative for depressed mood, confusion and sleep disturbance. The patient is not nervous/anxious.    PMFS History:  Patient Active Problem List   Diagnosis Date Noted   Rectal pain 10/10/2020   Acute posthemorrhagic anemia    Rectal bleeding 06/17/2019   Grade II internal hemorrhoids    Rectal ulcer    Popliteal artery occlusion, right ([place]) 04/10/2018   HTN (hypertension) 04/10/2018   Femoral artery pseudo-aneurysm, right ([place]) 04/09/2018   Anemia of chronic disease 02/24/2018   Unstable right hip arthroplasty 01/24/2018   History of revision of total replacement of right hip joint 01/24/2018   Hypovolemic shock ([place])    Hyperkalemia    Hyponatremia    Failed total hip arthroplasty ([place]) 11/27/2017   Status post revision of total hip 11/27/2017   Elevated cholesterol 10/11/2015   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[place]) 10/04/2014   Bilateral leg edema 08/01/2014   Elevated BP 08/01/2014   Rheumatoid arthritis involving  multiple joints ([place]) 05/30/2014   Status post total replacement of right hip 04/28/2014   Long-term use of high-risk medication 11/11/2013   Symptomatic anemia 11/11/2013   Neuropathic pain 07/07/2013   Constipation due to pain medication 07/07/2013   Protein-calorie malnutrition, severe ([place]) 06/08/2013   Lung cancer,  Right upper lobe 05/08/2013   Sciatica of right side 08/13/2011   Osteoporosis 03/07/2010   PARESTHESIA 03/07/2010   CT, CHEST, ABNORMAL 12/18/2008   ABNORMAL ECHOCARDIOGRAM 12/14/2008   SJOGREN'S SYNDROME 11/29/2008   HYPOGLYCEMIA 06/29/2006   RAYNAUD'S DISEASE 06/29/2006    Past Medical History:  Diagnosis Date   Arthralgia of multiple joints    followed by dr Gerilyn Nestle   Arthritis    Cardiomyopathy ([place])    Chronic constipation    Chronic inflammatory arthritis    rhemotolgist-  dr a. Gerilyn Nestle ([facility])   Dry eyes    eye drops used    GERD (gastroesophageal reflux disease)    H/O discoid lupus erythematosus    Hiatal hernia    History of colon polyps    Hypothyroidism    Iron deficiency anemia    LBBB (left bundle branch block) 2010   Mitchell's disease (erythromelalgia) [facility])    neurologist-  dr patel   Nocturia    Non-small cell cancer of right lung [facility]) surgeon-- dr gerhardt/  oncologist-  dr Julien Nordmann--- per lov notes no recurrence/   11-18-2017 per pt denies any symptoms   dx 2015--  Stage IIA (T2b,N0,M0) , +EGFR  mutation in exon 21, non-small cell adenocarcinoma right upper lobe---  s/p  Right upper lobectomy , right middley wedge resection and node dissection---  no chemo or radiation therapy   OA (osteoarthritis)    hands   Osteoporosis    PONV (postoperative nausea and vomiting)    likes phenergan   Raynaud's phenomenon 1965   Renal insufficiency    Rheumatoid arthritis ([place])    Sciatica    Scoliosis    Sjogren's syndrome ([place])     Family History  Problem Relation Age of Onset   Coronary artery disease Father    Colon cancer Father    Diabetes Father    Cancer Father        colon   Other Mother 64       MVA   Healthy Sister    Healthy Brother    Healthy Daughter    Hypothyroidism Daughter    Other Brother        killed in war   Pneumonia Sister    Healthy Daughter    Esophageal cancer Neg Hx    Kidney disease Neg Hx    Liver  disease Neg Hx    Past Surgical History:  Procedure Laterality Date   ANTERIOR HIP REVISION Right 11/27/2017   Procedure: RIGHT HIP ACETABULAR REVISION;  Surgeon: [name], MD;  Location: [facility];  Service: Orthopedics;  Laterality: Right;   ANTERIOR HIP REVISION Right 01/24/2018   Procedure: OPEN REDUCTION OF DISLOCATED ANTERIOR HIP WITH REVISION OF LINER AND HIP BALL;  Surgeon: [name], MD;  [facility]: [facility];  Service: Orthopedics;  Laterality: Right;   APPENDECTOMY  1950s   BIOPSY  04/14/2018   Procedure: BIOPSY;  Surgeon: Yetta Flock, MD;  Location: [place];  Service: Gastroenterology;;   BIOPSY  04/16/2018   Procedure: BIOPSY;  Surgeon: Irving Copas., MD;  Location: [place];  Service: Gastroenterology;;   CARDIOVASCULAR STRESS TEST  12/2008  mild fixed basal to mid septal perfusion defect felt likely due to artifact from LBBB, no ischemia, EF 58%   COLONOSCOPY     COLONOSCOPY WITH PROPOFOL N/A 04/16/2018   Procedure: COLONOSCOPY WITH PROPOFOL;  Surgeon: Rush Landmark Telford Nab., MD;  Location: [facility];  Service: Gastroenterology;  Laterality: N/A;   ESOPHAGOGASTRODUODENOSCOPY (EGD) WITH PROPOFOL N/A 04/14/2018   Procedure: ESOPHAGOGASTRODUODENOSCOPY (EGD) WITH PROPOFOL;  Surgeon: Yetta Flock, MD;  Location: [place];  Service: Gastroenterology;  Laterality: N/A;   FEMORAL-POPLITEAL BYPASS GRAFT Right 04/10/2018   Procedure: REPAIR RIGHT FEMORAL ARTERY PSEUDOANEURYSM, RETROPERITONEAL EXPOSURE OF ILIAC ARTERY, RIGHT POPLITEAL EMBOLECTOMY;  Surgeon: Angelia Mould, MD;  Location: [place];  Service: Vascular;  Laterality: Right;   FLEXIBLE SIGMOIDOSCOPY N/A 06/17/2019   Procedure: FLEXIBLE SIGMOIDOSCOPY;  Surgeon: Lavena Bullion, DO;  [facility];  Service: Gastroenterology;  Laterality: N/A;   HEMOSTASIS CLIP PLACEMENT  06/17/2019   Procedure: HEMOSTASIS CLIP PLACEMENT;  Surgeon: Lavena Bullion,  DO;  [facility];  Service: Gastroenterology;;   LYMPH NODE DISSECTION Right 06/07/2013   Procedure: LYMPH NODE DISSECTION;  Surgeon: [name], MD;  Location: [place];  Service: Thoracic;  Laterality: Right;   PATCH ANGIOPLASTY Right 04/10/2018   Procedure: PATCH  ANGIOPLASTY OF RIGHT FEMORAL ARTERY USING BOVINE PATCH, PATCH ANGIOPLASTY OF RIGHT POPLITEAL ARTERY USING BOVINE PATCH;  Surgeon: Angelia Mould, MD;  Location: [place];  Service: Vascular;  Laterality: Right;   SCHLEROTHERAPY  06/17/2019   Procedure: Woodward Ku OF VARICES;  Surgeon: Lavena Bullion, DO;  [facility];  Service: Gastroenterology;;   [place]   "large incision from chest to up to shoulder, the nerves were tied together, for raynaud's   THORACOTOMY  06/07/2013   Procedure: MINI/LIMITED THORACOTOMY; right middle lobe wedge resection;  Surgeon: [name], MD;  Location: [place];  Service: Thoracic;;   TONSILLECTOMY  child   TOTAL ABDOMINAL HYSTERECTOMY  1980's    W/ BSO   TOTAL HIP ARTHROPLASTY Right 04/28/2014   Procedure: RIGHT TOTAL HIP ARTHROPLASTY ANTERIOR APPROACH;  Surgeon: [name], MD;  [facility];  Service: Orthopedics;  Laterality: Right;   TRANSTHORACIC ECHOCARDIOGRAM  12/11/2008   ef 16-10%, grade 1 diastolic dysfunction/  mild LAE/  mild AR and MR/  trivial TR   VIDEO ASSISTED THORACOSCOPY (VATS)/WEDGE RESECTION Right 06/07/2013   Procedure: VIDEO ASSISTED THORACOSCOPY (VATS)/right upper lobectomy, On Q;  Surgeon: [name], MD;  Location: [place];  Service: Thoracic;  Laterality: Right;   VIDEO BRONCHOSCOPY N/A 06/07/2013   Procedure: VIDEO BRONCHOSCOPY;  Surgeon: [name], MD;  Location: [place];  Service: Thoracic;  Laterality: N/A;   VIDEO BRONCHOSCOPY WITH ENDOBRONCHIAL NAVIGATION N/A 05/04/2013   Procedure: VIDEO BRONCHOSCOPY WITH ENDOBRONCHIAL NAVIGATION;  Surgeon: [name], MD;  Location: [place] [facility];   Service: Thoracic;  Laterality: N/A;   Social History   Social History Narrative   Lives with husband, daughter and grandchild local.   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 History  Administered Date(s) Administered   Fluad Quad(high Dose 65+) 11/03/2018, 12/14/2019   Influenza Split 11/21/2011   Influenza Whole 11/29/2007, 11/29/2008, 11/29/2009   Influenza, High Dose Seasonal PF 10/30/2012, 01/02/2015, 11/13/2016, 11/12/2017   Influenza,inj,Quad PF,6+ Mos 11/22/2013, 11/14/2015   Influenza-Unspecified 11/13/2016, 11/12/2017, 12/14/2020   PFIZER(Purple Top)SARS-COV-2 Vaccination 04/12/2019, 05/03/2019, 01/20/2020, 12/14/2020   Pneumococcal Conjugate-13 05/22/2015   Pneumococcal Polysaccharide-23 06/13/2013  Tdap 08/17/2017   Zoster, Live 01/27/2014     Objective: Vital Signs: BP 139/80 (BP Location: Right Arm, Patient Position: Sitting, Cuff Size: Normal)   Pulse 88   Ht 5' (1.524 m)   Wt 110 lb 3.2 oz (50 kg)   BMI 21.52 kg/m    Physical Exam Vitals and nursing note reviewed.  Constitutional:      Appearance: She is well-developed.  HENT:     Head: Normocephalic and atraumatic.  Eyes:     Conjunctiva/sclera: Conjunctivae normal.  Cardiovascular:     Rate and Rhythm: Normal rate and regular rhythm.     Heart sounds: Normal heart sounds.  Pulmonary:     Effort: Pulmonary effort is normal.     Breath sounds: Normal breath sounds.  Abdominal:     General: Bowel sounds are normal.     Palpations: Abdomen is soft.  Musculoskeletal:     Cervical back: Normal range of motion.  Lymphadenopathy:     Cervical: No cervical adenopathy.  Skin:    General: Skin is warm and dry.     Capillary Refill: Capillary refill takes less than 2 seconds.  Neurological:     Mental Status: She is alert and oriented to person, place, and time.  Psychiatric:        Behavior: Behavior normal.     Musculoskeletal Exam: C-spine was in  good range of motion.  Shoulder joint abduction was limited to 90 degrees bilaterally.  Elbow joints were in good range of motion.  There was some limitation range of motion of wrist joints with no synovitis.  Synovial thickening was noted over bilateral MCP joints with no synovitis.  PIP and DIP thickening with subluxation of most of her DIP joints was noted.  Hip joints and knee joints with good range of motion.  She did not have any tenderness over her ankle joints.  No tenderness noted over MTP joints.  CDAI Exam: CDAI Score: -- Patient Global: --; Provider Global: -- Swollen: --; Tender: -- Joint Exam 12/18/2020   No joint exam has been documented for this visit   There is currently no information documented on the homunculus. Go to the Rheumatology activity and complete the homunculus joint exam.  Investigation: No additional findings.  Imaging: No results found.  Recent Labs: Lab Results  Component Value Date   WBC 4.0 11/21/2020   HGB 11.4 (L) 11/21/2020   PLT 186 11/21/2020   NA 130 (L) 11/21/2020   K 4.8 11/21/2020   CL 94 (L) 11/21/2020   CO2 27 11/21/2020   GLUCOSE 97 11/21/2020   BUN 29 (H) 11/21/2020   CREATININE 1.17 (H) 11/21/2020   BILITOT 0.7 11/21/2020   ALKPHOS 36 (L) 11/21/2020   AST 29 11/21/2020   ALT 15 11/21/2020   PROT 6.6 11/21/2020   ALBUMIN 3.9 11/21/2020   CALCIUM 9.6 11/21/2020   GFRAA 60 05/21/2020   QFTBGOLDPLUS Negative 07/04/2020    Speciality Comments: MTX, Arava-side effects Prolia - 04/22/19, 11/09/19, 06/06/20  Procedures:  No procedures performed Allergies: Amlodipine, Prochlorperazine edisylate, Aspirin, Cymbalta [duloxetine hcl], and Pamelor [nortriptyline hcl]   Assessment / Plan:     Visit Diagnoses: Rheumatoid arthritis of multiple sites with negative rheumatoid factor ([place]) - RF negative, anti-CCP negative: She is clinically doing well with no synovitis.  She has been tolerating IV Orencia well.  She denies any side effects  from [place].  High risk medication use - Orencia 500 mg IV now every 28 days (previously  on [place]).  Labs obtained on November 21, 2020 were reviewed which showed anemia with hemoglobin of 11.4.  CMP showed creatinine 1.17.  TB gold was negative on Jul 04, 2020.  Updated information regarding immunization was placed in the AVS.  Patient recently had flu vaccine and COVID-19 vaccine.  She will have a 1 week before IV Orencia.  She also had recent shingles infection.  According to her it was treated with antiviral therapy.  She was advised to hold Orencia in case she develops an infection.  Sicca syndrome (HCC)-she continues to have dry mouth and dry eyes symptoms.  Over-the-counter products were discussed.  Raynaud's disease without gangrene-she has intermittent symptoms which are worse during the winter months.  No nailbed capillary changes or sclerodactyly was noted.  Hair loss - her hair loss has improved since she has been off leflunomide.  Status post total replacement of right hip - 01/2018 revision by Dr. Beryle Beams.  She has been having increased right hip joint pain.  She plans to schedule an appointment with Dr. Ninfa Linden.  Other idiopathic scoliosis, thoracolumbar region-she says no chronic discomfort due to scoliosis.  She is on tramadol by her PCP which is helpful.  Age-related osteoporosis without current pathological fracture - DEXA 01/28/2019: The BMD measured at Femur Neck is 0.636 g/cm2 with a T-score of -2.9. last prolia: 06/06/2020.  She is scheduled to have Prolia injection next week.  Height loss-due to osteoporosis.  Malignant neoplasm of upper lobe of right lung ([place]) -s/p lobectomy 2015 and she declined adjuvant systemic chemotherapy.  Recent CT scan showed new lesions and most likely metastatic lung disease.  Patient has been followed by Dr. Julien Nordmann.  I reviewed Dr. Wallie Renshaw notes.  He discussed treatment options with the patient and her daughter.   Patient referred observation for now.  Elevated cholesterol  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HCC)  Anemia of chronic disease-hemoglobin is low and stable.  Essential hypertension-blood pressure is well controlled.  Orders: No orders of the defined types were placed in this encounter.  No orders of the defined types were placed in this encounter.    Follow-Up Instructions: Return in about 3 months (around 03/20/2021) for Rheumatoid arthritis, Osteoarthritis.   Bo Merino, MD  Note - This record has been created using Editor, commissioning.  Chart creation errors have been sought, but may not always  have been located. Such creation errors do not reflect on  the standard of medical care.

## 2020-12-08 ENCOUNTER — Other Ambulatory Visit: Payer: Self-pay | Admitting: Family Medicine

## 2020-12-08 DIAGNOSIS — G8929 Other chronic pain: Secondary | ICD-10-CM

## 2020-12-08 DIAGNOSIS — M25512 Pain in left shoulder: Secondary | ICD-10-CM

## 2020-12-11 ENCOUNTER — Ambulatory Visit: Payer: Medicare PPO | Admitting: Rheumatology

## 2020-12-18 ENCOUNTER — Ambulatory Visit: Payer: Medicare PPO | Admitting: Rheumatology

## 2020-12-18 ENCOUNTER — Encounter: Payer: Self-pay | Admitting: Rheumatology

## 2020-12-18 ENCOUNTER — Other Ambulatory Visit: Payer: Self-pay

## 2020-12-18 VITALS — BP 139/80 | HR 88 | Ht 60.0 in | Wt 110.2 lb

## 2020-12-18 DIAGNOSIS — I7300 Raynaud's syndrome without gangrene: Secondary | ICD-10-CM | POA: Diagnosis not present

## 2020-12-18 DIAGNOSIS — M0609 Rheumatoid arthritis without rheumatoid factor, multiple sites: Secondary | ICD-10-CM

## 2020-12-18 DIAGNOSIS — M4125 Other idiopathic scoliosis, thoracolumbar region: Secondary | ICD-10-CM

## 2020-12-18 DIAGNOSIS — I10 Essential (primary) hypertension: Secondary | ICD-10-CM

## 2020-12-18 DIAGNOSIS — Z96641 Presence of right artificial hip joint: Secondary | ICD-10-CM | POA: Diagnosis not present

## 2020-12-18 DIAGNOSIS — L659 Nonscarring hair loss, unspecified: Secondary | ICD-10-CM | POA: Diagnosis not present

## 2020-12-18 DIAGNOSIS — Z79899 Other long term (current) drug therapy: Secondary | ICD-10-CM | POA: Diagnosis not present

## 2020-12-18 DIAGNOSIS — M3500 Sicca syndrome, unspecified: Secondary | ICD-10-CM | POA: Diagnosis not present

## 2020-12-18 DIAGNOSIS — C3411 Malignant neoplasm of upper lobe, right bronchus or lung: Secondary | ICD-10-CM

## 2020-12-18 DIAGNOSIS — M810 Age-related osteoporosis without current pathological fracture: Secondary | ICD-10-CM | POA: Diagnosis not present

## 2020-12-18 DIAGNOSIS — R29890 Loss of height: Secondary | ICD-10-CM

## 2020-12-18 DIAGNOSIS — D638 Anemia in other chronic diseases classified elsewhere: Secondary | ICD-10-CM

## 2020-12-18 DIAGNOSIS — E270 Other adrenocortical overactivity: Secondary | ICD-10-CM

## 2020-12-18 DIAGNOSIS — E7800 Pure hypercholesterolemia, unspecified: Secondary | ICD-10-CM

## 2020-12-18 NOTE — Patient Instructions (Signed)
Vaccines You are taking a medication(s) that can suppress your immune system.  The following immunizations are recommended: Flu annually Covid-19  Td/Tdap (tetanus, diphtheria, pertussis) every 10 years Pneumonia (Prevnar 15 then Pneumovax 23 at least 1 year apart.  Alternatively, can take Prevnar 20 without needing additional dose) Shingrix: 2 doses from 4 weeks to 6 months apart  Please check with your PCP to make sure you are up to date.   COVID-19 vaccine recommendations:   COVID-19 vaccine is recommended for everyone (unless you are allergic to a vaccine component), even if you are on a medication that suppresses your immune system.     If you are on Orencia IV infusions- time vaccination administration so that the first COVID-19 vaccination will occur four weeks after the infusion and postpone the subsequent infusion by one week.    Do not take Tylenol or any anti-inflammatory medications (NSAIDs) 24 hours prior to the COVID-19 vaccination.   There is no direct evidence about the efficacy of the COVID-19 vaccine in individuals who are on medications that suppress the immune system.   Even if you are fully vaccinated, and you are on any medications that suppress your immune system, please continue to wear a mask, maintain at least six feet social distance and practice hand hygiene.   If you develop a COVID-19 infection, please contact your PCP or our office to determine if you need monoclonal antibody infusion.  The booster vaccine is now available for immunocompromised patients.   Please see the following web sites for updated information.   [email or web address]://[email or web address]/[email or web address]-Vaccination-Patient-Resources.[email or web address]   If you have signs or symptoms of an infection or start antibiotics: First, call your PCP for workup of your infection. Hold your medication through the infection, until you complete your antibiotics, and until symptoms resolve if you take the  following: Injectable medication (Actemra, Benlysta, Cimzia, Cosentyx, Enbrel, Humira, Kevzara, Orencia, Remicade, Simponi, Stelara, Taltz, Tremfya) Methotrexate Leflunomide (Arava) Mycophenolate (Cellcept) Morrie Sheldon, Olumiant, or Rinvoq    Heart Disease Prevention   Your inflammatory disease increases your risk of heart disease which includes heart attack, stroke, atrial fibrillation (irregular heartbeats), high blood pressure, heart failure and atherosclerosis (plaque in the arteries).  It is important to reduce your risk by:   Keep blood pressure, cholesterol, and blood sugar at healthy levels   Smoking Cessation   Maintain a healthy weight  BMI 20-25   Eat a healthy diet  Plenty of fresh fruit, vegetables, and whole grains  Limit saturated fats, foods high in sodium, and added sugars  DASH and Mediterranean diet   Increase physical activity  Recommend moderate physically activity for 150 minutes per week/ 30 minutes a day for five days a week These can be broken up into three separate ten-minute sessions during the day.   Reduce Stress  Meditation, slow breathing exercises, yoga, coloring books  Dental visits twice a year

## 2020-12-19 ENCOUNTER — Other Ambulatory Visit: Payer: Self-pay | Admitting: Pharmacist

## 2020-12-19 ENCOUNTER — Ambulatory Visit (HOSPITAL_COMMUNITY): Payer: Medicare PPO

## 2020-12-19 DIAGNOSIS — Z79899 Other long term (current) drug therapy: Secondary | ICD-10-CM

## 2020-12-19 DIAGNOSIS — M0609 Rheumatoid arthritis without rheumatoid factor, multiple sites: Secondary | ICD-10-CM

## 2020-12-19 NOTE — Progress Notes (Signed)
Next infusion scheduled for Orencia IV on 12/20/20 and due for updated orders. She will also be receiving her Prolia injection tomorrow (orders already in place and confirmed)  Diagnosis: RA  Dose: $Remo'500mg'tQnCE$  every 28 days (based on last recorded weight of 50kg)  Last Clinic Visit: 12/18/20 Next Clinic Visit: 03/21/21  Last infusion: 11/21/20  Labs: CBC and CMP on 11/21/20 - Sodium and chloride are low. Alk phos is borderline low.  Creatinine remains slightly elevated and GFR is low-45 - labs forwarded to PCP TB Gold: negative on 07/04/20   Orders placed for Orencia IV x 3 doses along with premedication of acetaminophen and diphenhydramine to be administered 30 minutes before medication infusion.  Standing CBC with diff/platelet and CMP with GFR orders placed to be drawn every 2 months.  Next TB gold due 07/04/2021  Knox Saliva, PharmD, MPH, BCPS Clinical Pharmacist (Rheumatology and Pulmonology)

## 2020-12-19 NOTE — Progress Notes (Signed)
Spoke with Devki, RPH and clarified if it was okay to give orencia and prolia in the same day and she stated the patient has had it before and did fine so it was okay to do them both in the same day.

## 2020-12-20 ENCOUNTER — Ambulatory Visit (HOSPITAL_COMMUNITY)
Admission: RE | Admit: 2020-12-20 | Discharge: 2020-12-20 | Disposition: A | Discharge status: Home / Self Care | Payer: Medicare PPO | Source: Ambulatory Visit | Attending: Rheumatology | Admitting: Rheumatology

## 2020-12-20 DIAGNOSIS — M0609 Rheumatoid arthritis without rheumatoid factor, multiple sites: Secondary | ICD-10-CM | POA: Diagnosis not present

## 2020-12-20 DIAGNOSIS — M810 Age-related osteoporosis without current pathological fracture: Secondary | ICD-10-CM | POA: Insufficient documentation

## 2020-12-20 MED ORDER — DENOSUMAB 60 MG/ML ~~LOC~~ SOSY: 60.0000 mg | PREFILLED_SYRINGE | Freq: Once | SUBCUTANEOUS | Status: DC

## 2020-12-20 MED ORDER — DIPHENHYDRAMINE HCL 25 MG PO CAPS: 25.0000 mg | ORAL_CAPSULE | ORAL | Status: DC

## 2020-12-20 MED ORDER — ACETAMINOPHEN 325 MG PO TABS: 650.0000 mg | ORAL_TABLET | ORAL | Status: DC

## 2020-12-20 MED ORDER — SODIUM CHLORIDE 0.9 % IV SOLN
500.0000 mg | INTRAVENOUS | Status: DC
  Administered 2020-12-20: 500 mg via INTRAVENOUS
  Filled 2020-12-20: qty 20

## 2020-12-20 MED ORDER — DENOSUMAB 60 MG/ML ~~LOC~~ SOSY
PREFILLED_SYRINGE | SUBCUTANEOUS | Status: AC
  Administered 2020-12-20: 60 mg via SUBCUTANEOUS
  Filled 2020-12-20: qty 1

## 2020-12-22 ENCOUNTER — Other Ambulatory Visit: Payer: Self-pay | Admitting: Family Medicine

## 2020-12-22 IMAGING — CT CT CHEST W/ CM
2 of 4 series · 15 of 36 positions shown, 18 images · IV contrast (omnipaque)
Comparison: 08/21/2017 chest CT.

CLINICAL DATA: Stage IIA right upper lobe lung adenocarcinoma
status post right upper lobectomy and right middle lobe wedge
resection 06/07/2013. Restaging.

EXAM:
CT CHEST WITH CONTRAST
TECHNIQUE: Multidetector CT imaging of the chest was performed during
intravenous contrast administration.
CONTRAST:  60mL OMNIPAQUE IOHEXOL 300 MG/ML  SOLN

[Series 2: axial st · axial · 0.67mm/px · z∈[-335,-83]mm · 12 of 148 slices shown, 15 images]
[im 11/148  mediastinal]
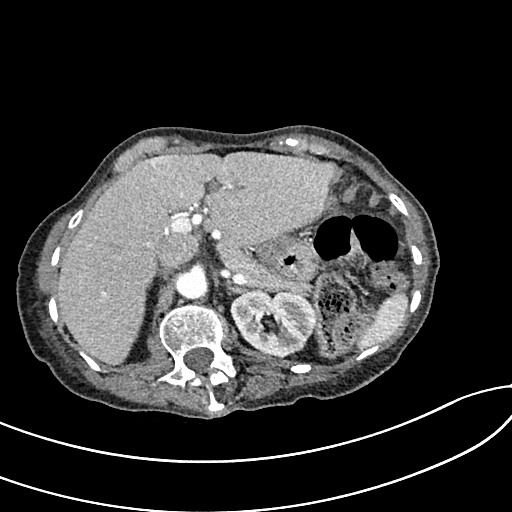
[im 11/148  lung]
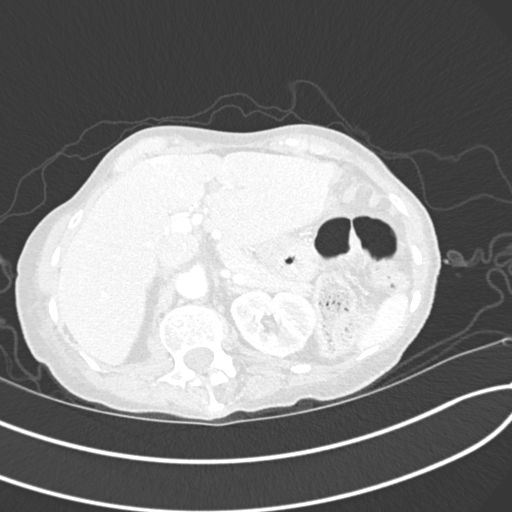
[im 22/148  lung]
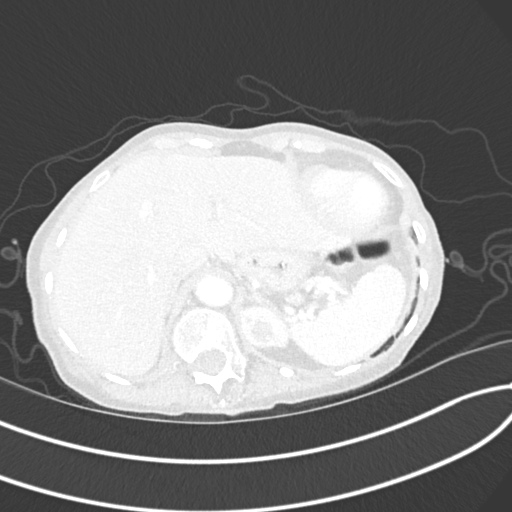
[im 32/148  lung]
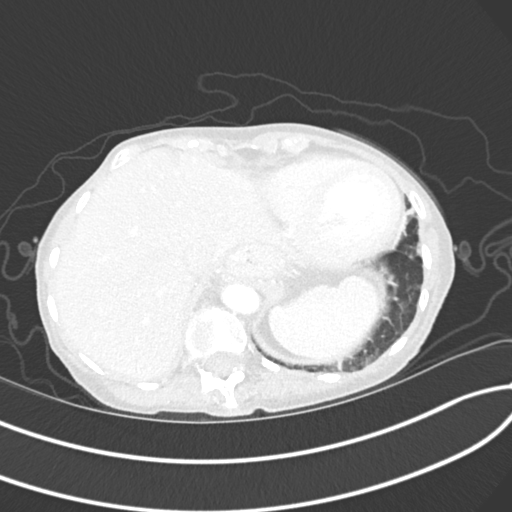
[im 43/148  lung]
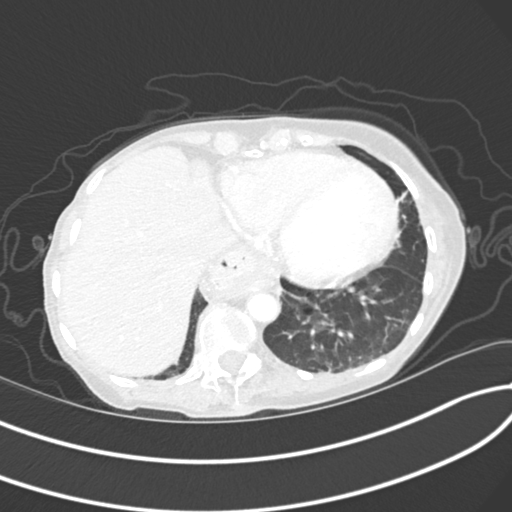
[im 53/148  mediastinal]
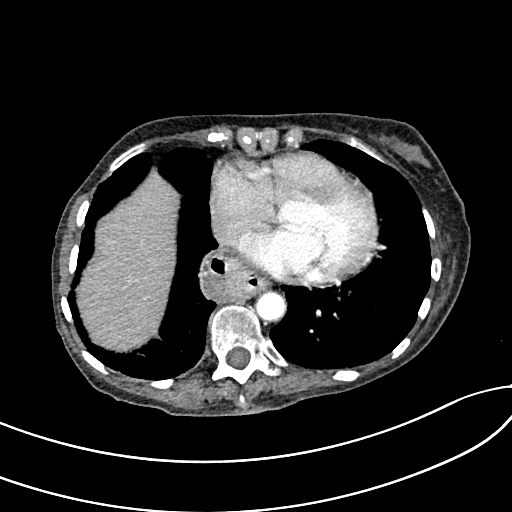
[im 53/148  lung]
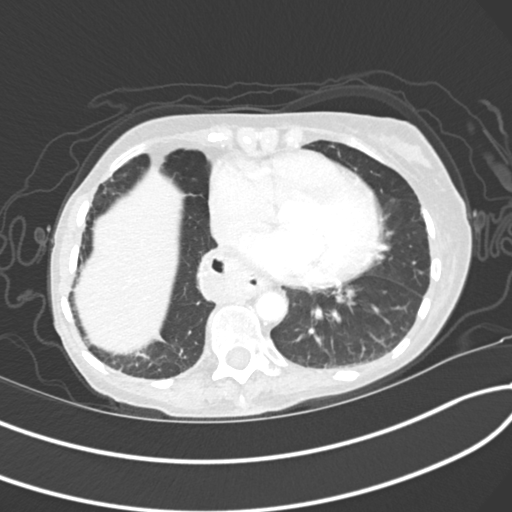
[im 64/148  lung]
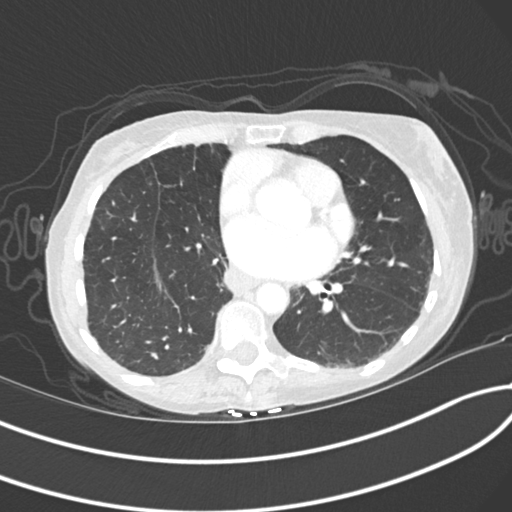
[im 85/148  lung]
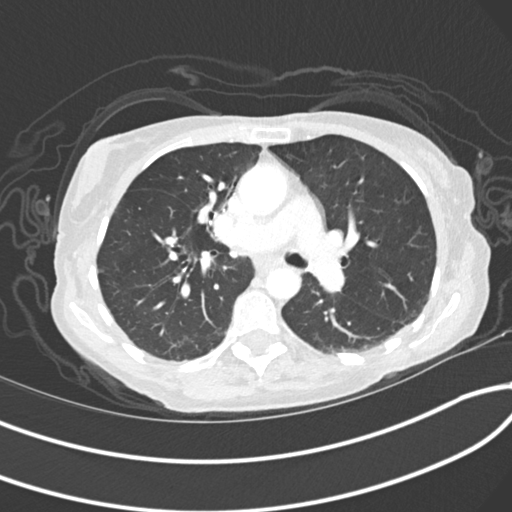
[im 95/148  lung]
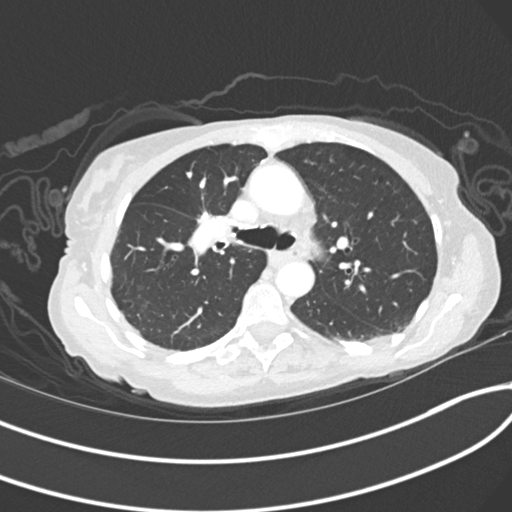
[im 106/148  mediastinal]
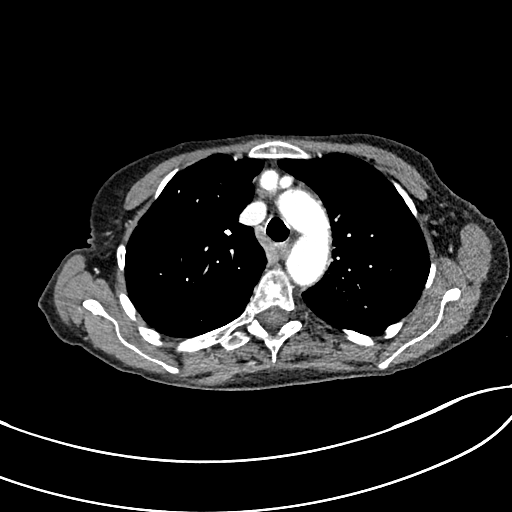
[im 106/148  lung]
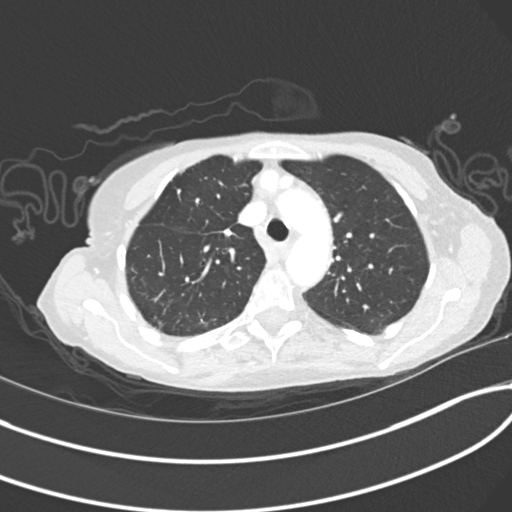
[im 116/148  lung]
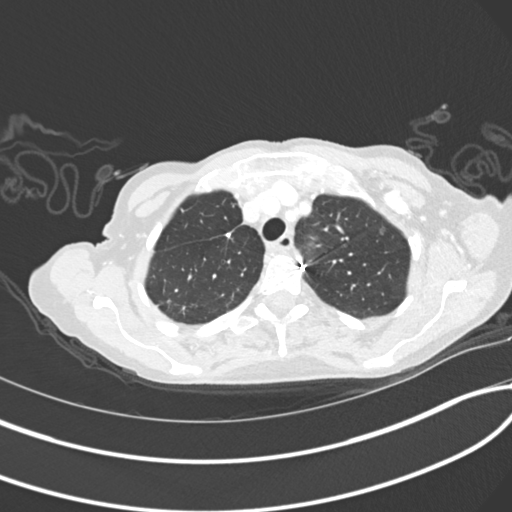
[im 127/148  lung]
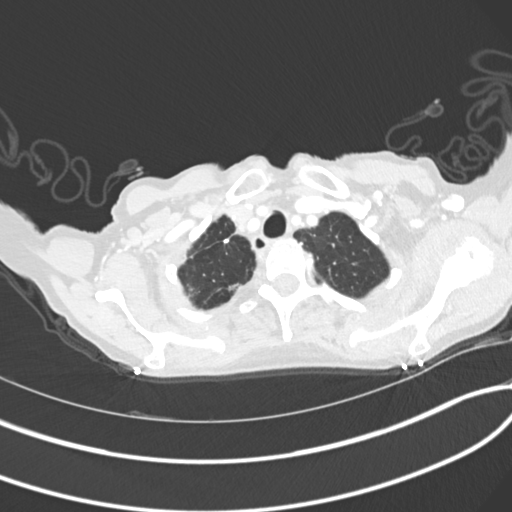
[im 137/148  lung]
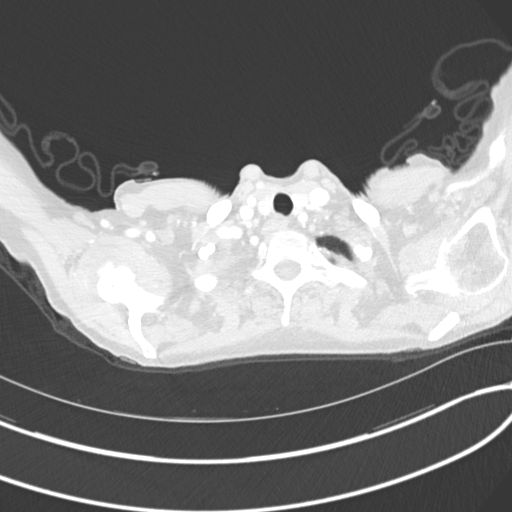

[Series 5: coronal · coronal · 0.68mm/px · 3 of 99 slices shown]
[im 20/99  lung]
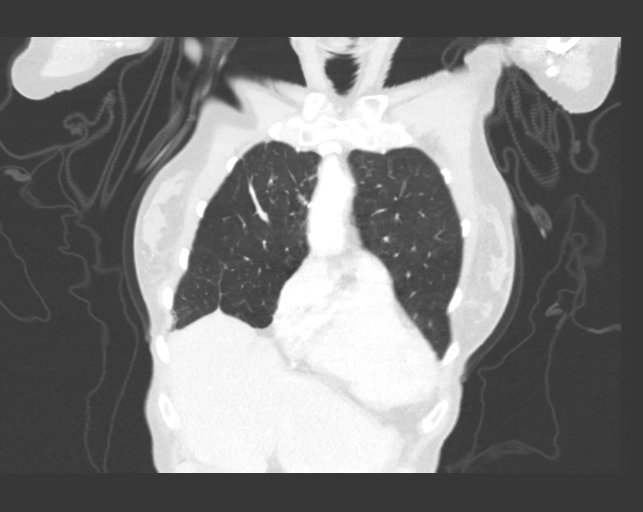
[im 40/99  lung]
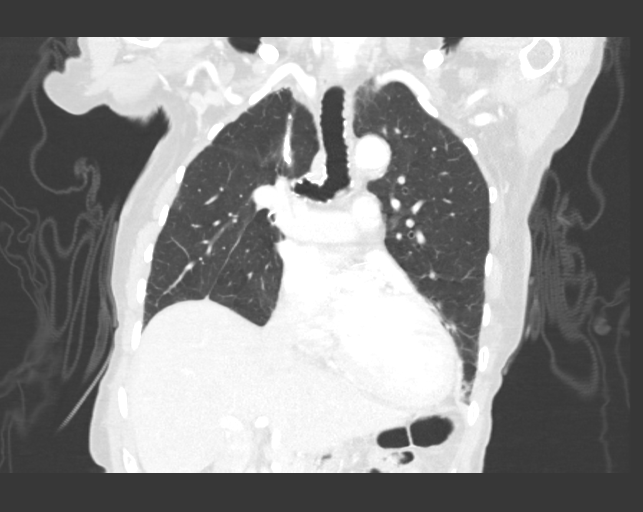
[im 59/99  lung]
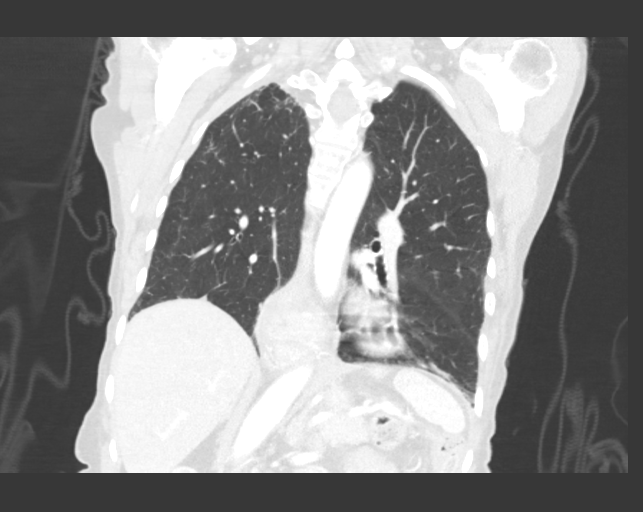

[15 of 36 positions shown; findings below may reference images not displayed]

FINDINGS: Cardiovascular: Normal heart size. No significant pericardial
effusion/thickening. Atherosclerotic nonaneurysmal thoracic aorta.
Normal caliber pulmonary arteries. No central pulmonary emboli.

Mediastinum/Nodes: No discrete thyroid nodules. Unremarkable
esophagus. No pathologically enlarged axillary, mediastinal or hilar
lymph nodes.

Lungs/Pleura: No pneumothorax. No pleural effusion. Stable
postsurgical changes from right upper lobectomy and right middle
lobe wedge resection. No acute consolidative airspace disease, lung
masses or significant pulmonary nodules.

Upper abdomen: Moderate hiatal hernia.

Musculoskeletal:  No aggressive appearing focal osseous lesions.
IMPRESSION: 1. No evidence of metastatic disease in the chest.
2. Stable postsurgical changes from right upper lobectomy and right
middle lobe wedge resection with no evidence of local tumor
recurrence.
3. Moderate hiatal hernia.

Aortic Atherosclerosis (HAIL3-LRI.I).

## 2021-01-15 ENCOUNTER — Encounter: Payer: Self-pay | Admitting: Family Medicine

## 2021-01-17 ENCOUNTER — Ambulatory Visit (HOSPITAL_BASED_OUTPATIENT_CLINIC_OR_DEPARTMENT_OTHER)
Admission: RE | Admit: 2021-01-17 | Discharge: 2021-01-17 | Disposition: A | Discharge status: Home / Self Care | Payer: Medicare PPO | Source: Ambulatory Visit | Attending: Family Medicine | Admitting: Family Medicine

## 2021-01-17 ENCOUNTER — Ambulatory Visit (HOSPITAL_COMMUNITY): Payer: Medicare PPO

## 2021-01-17 ENCOUNTER — Ambulatory Visit: Payer: Medicare PPO | Admitting: Family Medicine

## 2021-01-17 ENCOUNTER — Encounter: Payer: Self-pay | Admitting: Family Medicine

## 2021-01-17 ENCOUNTER — Other Ambulatory Visit: Payer: Self-pay

## 2021-01-17 VITALS — BP 123/70 | HR 107 | Temp 98.3°F | Resp 18 | Wt 106.2 lb

## 2021-01-17 DIAGNOSIS — R5383 Other fatigue: Secondary | ICD-10-CM

## 2021-01-17 DIAGNOSIS — I743 Embolism and thrombosis of arteries of the lower extremities: Secondary | ICD-10-CM | POA: Insufficient documentation

## 2021-01-17 DIAGNOSIS — M47816 Spondylosis without myelopathy or radiculopathy, lumbar region: Secondary | ICD-10-CM | POA: Diagnosis not present

## 2021-01-17 DIAGNOSIS — Z9889 Other specified postprocedural states: Secondary | ICD-10-CM | POA: Diagnosis not present

## 2021-01-17 DIAGNOSIS — I10 Essential (primary) hypertension: Secondary | ICD-10-CM | POA: Diagnosis not present

## 2021-01-17 DIAGNOSIS — S32511A Fracture of superior rim of right pubis, initial encounter for closed fracture: Secondary | ICD-10-CM | POA: Diagnosis not present

## 2021-01-17 LAB — BASIC METABOLIC PANEL
BUN: 26 mg/dL — ABNORMAL HIGH (ref 6–23)
CO2: 31 mEq/L (ref 19–32)
Calcium: 9.7 mg/dL (ref 8.4–10.5)
Chloride: 96 mEq/L (ref 96–112)
Creatinine, Ser: 1.13 mg/dL (ref 0.40–1.20)
GFR: 43.54 mL/min — ABNORMAL LOW (ref >60.00)
Glucose, Bld: 122 mg/dL — ABNORMAL HIGH (ref 70–99)
Potassium: 4.1 mEq/L (ref 3.5–5.1)
Sodium: 131 mEq/L — ABNORMAL LOW (ref 135–145)

## 2021-01-17 LAB — CBC
HCT: 33.7 % — ABNORMAL LOW (ref 36.0–46.0)
Hemoglobin: 11 g/dL — ABNORMAL LOW (ref 12.0–15.0)
MCHC: 32.7 g/dL (ref 30.0–36.0)
MCV: 91.7 fl (ref 78.0–100.0)
Platelets: 237 10*3/uL (ref 150.0–400.0)
RBC: 3.68 Mil/uL — ABNORMAL LOW (ref 3.87–5.11)
RDW: 13.3 % (ref 11.5–15.5)
WBC: 5.4 10*3/uL (ref 4.0–10.5)

## 2021-01-17 MED ORDER — IOHEXOL 350 MG/ML SOLN
100.0000 mL | Freq: Once | INTRAVENOUS | Status: AC | PRN
  Administered 2021-01-17: 100 mL via INTRAVENOUS

## 2021-01-17 NOTE — Progress Notes (Signed)
[facility] [employer] [place], [place], [place] [address] [phone]  Date:  01/17/2021   Name:  [name]   DOB:  02-Nov-1930   MRN:  [id number]  PCP:  Darreld Mclean, MD    Chief Complaint: Hip Pain (Pt has an appointment on Monday with her specialist. )   History of Present Illness:  [name] is a 87 y.o. 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  She got out of bed and her right leg was not moving normally.  She had a hard time moving it and when it did cooperate it was painful-she noted pain in the hip and upper right thigh She was a bit more active getting ready for Thanksgiving but did not think she injured herself or was excessively active  She has an ortho appt but not until Monday so we brought her in today  She feels fine when sitting still, but when standing or walking she has pain in her leg She needs her walker in order to get around- normally she does not require her walker  She had a hip replacement in 11/2017- she fell and dislocated the hip and it was reviewed 12/19.  She then developed pain and swelling in her groin and was found to have formation of a pseudoaneurysm in 03/2018:  IMPRESSION: 1. 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 The neck of the aneurysm measures approximately 3 mm in diameter. 2. Short-segment occlusive emboli within the distal aspect of the right popliteal artery extending to involve the proximal aspects of the right anterior tibial artery and the  tibioperoneal trunk. 3. Short-segment occlusive embolism within the right popliteal artery at the level of the hindfoot.   She had repair of the aneurysm and did well- she did have a GI bleed in 05/2019 but otherwise has not been in the hospital   Patient Active Problem List   Diagnosis Date Noted   Rectal pain 10/10/2020   Acute posthemorrhagic anemia    Rectal bleeding 06/17/2019   Grade II internal hemorrhoids    Rectal ulcer    Popliteal artery occlusion, right ([place]) 04/10/2018   HTN (hypertension) 04/10/2018   Femoral artery pseudo-aneurysm, right ([place]) 04/09/2018   Anemia of chronic disease 02/24/2018   Unstable right hip arthroplasty 01/24/2018   History of revision of total replacement of right hip joint 01/24/2018   Hypovolemic shock ([place])    Hyperkalemia    Hyponatremia    Failed total hip arthroplasty ([place]) 11/27/2017   Status post revision of total hip 11/27/2017   Elevated cholesterol 10/11/2015   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[place]) 10/04/2014   Bilateral leg edema 08/01/2014   Elevated BP 08/01/2014   Rheumatoid arthritis involving multiple joints ([place]) 05/30/2014   Status post total replacement of right hip 04/28/2014   Long-term use of high-risk medication 11/11/2013   Symptomatic anemia 11/11/2013   Neuropathic pain 07/07/2013   Constipation due to pain medication 07/07/2013   Protein-calorie malnutrition, severe ([place]) 06/08/2013   Lung cancer, Right upper lobe 05/08/2013   Sciatica of right side  08/13/2011   Osteoporosis 03/07/2010   PARESTHESIA 03/07/2010   CT, CHEST, ABNORMAL 12/18/2008   ABNORMAL ECHOCARDIOGRAM 12/14/2008   SJOGREN'S SYNDROME 11/29/2008   HYPOGLYCEMIA 06/29/2006   RAYNAUD'S DISEASE 06/29/2006    Past Medical History:  Diagnosis Date   Arthralgia of multiple joints    followed by dr Sharmon Revere   Arthritis    Cardiomyopathy (HCC)    Chronic constipation    Chronic inflammatory arthritis    rhemotolgist-  dr a. Sharmon Revere ([facility]  [place])   Dry eyes    eye drops used    GERD (gastroesophageal reflux disease)    H/O discoid lupus erythematosus    Hiatal hernia    History of colon polyps    Hypothyroidism    Iron deficiency anemia    LBBB (left bundle branch block) 2010   Mitchell's disease (erythromelalgia) [facility])    neurologist-  dr patel   Nocturia    Non-small cell cancer of right lung [facility]) surgeon-- dr gerhardt/  oncologist-  dr Arbutus Ped--- per lov notes no recurrence/   11-18-2017 per pt denies any symptoms   dx 2015--  Stage IIA (T2b,N0,M0) , +EGFR  mutation in exon 21, non-small cell adenocarcinoma right upper lobe---  s/p  Right upper lobectomy , right middley wedge resection and node dissection---  no chemo or radiation therapy   OA (osteoarthritis)    hands   Osteoporosis    PONV (postoperative nausea and vomiting)    likes phenergan   Raynaud's phenomenon 1965   Renal insufficiency    Rheumatoid arthritis (HCC)    Sciatica    Scoliosis    Sjogren's syndrome [facility])     Past Surgical History:  Procedure Laterality Date   ANTERIOR HIP REVISION Right 11/27/2017   Procedure: RIGHT HIP ACETABULAR REVISION;  Surgeon: Kathryne Hitch, MD;  Location: [facility] ORS;  Service: Orthopedics;  Laterality: Right;   ANTERIOR HIP REVISION Right 01/24/2018   Procedure: OPEN REDUCTION OF DISLOCATED ANTERIOR HIP WITH REVISION OF LINER AND HIP BALL;  Surgeon: Kathryne Hitch, MD;  Location: [facility];  Service: Orthopedics;  Laterality: Right;   APPENDECTOMY  1950s   BIOPSY  04/14/2018   Procedure: BIOPSY;  Surgeon: Benancio Deeds, MD;  Location: [facility] ENDOSCOPY;  Service: Gastroenterology;;   BIOPSY  04/16/2018   Procedure: BIOPSY;  Surgeon: Lemar Lofty., MD;  Location: [facility] ENDOSCOPY;  Service: Gastroenterology;;   CARDIOVASCULAR STRESS TEST  12/2008    mild fixed basal to mid septal perfusion defect felt likely due to artifact from LBBB, no ischemia, EF 58%   COLONOSCOPY     COLONOSCOPY WITH  PROPOFOL N/A 04/16/2018   Procedure: COLONOSCOPY WITH PROPOFOL;  Surgeon: Lemar Lofty., MD;  Location[facility] ENDOSCOPY;  Service: Gastroenterology;  Laterality: N/A;   ESOPHAGOGASTRODUODENOSCOPY (EGD) WITH PROPOFOL N/A 04/14/2018   Procedure: ESOPHAGOGASTRODUODENOSCOPY (EGD) WITH PROPOFOL;  Surgeon: Benancio Deeds, MD;  Location: [facility] ENDOSCOPY;  Service: Gastroenterology;  Laterality: N/A;   FEMORAL-POPLITEAL BYPASS GRAFT Right 04/10/2018   Procedure: REPAIR RIGHT FEMORAL ARTERY PSEUDOANEURYSM, RETROPERITONEAL EXPOSURE OF ILIAC ARTERY, RIGHT POPLITEAL EMBOLECTOMY;  Surgeon: Chuck Hint, MD;  Location: [facility] OR;  Service: Vascular;  Laterality: Right;   FLEXIBLE SIGMOIDOSCOPY N/A 06/17/2019   Procedure: FLEXIBLE SIGMOIDOSCOPY;  Surgeon: Shellia Cleverly, DO;  [facility] ENDOSCOPY;  Service: Gastroenterology;  Laterality: N/A;   HEMOSTASIS CLIP PLACEMENT  06/17/2019   Procedure: HEMOSTASIS CLIP PLACEMENT;  Surgeon: Shellia Cleverly, DO;  [facility] ENDOSCOPY;  Service: Gastroenterology;;  LYMPH NODE DISSECTION Right 06/07/2013   Procedure: LYMPH NODE DISSECTION;  Surgeon: [name], MD;  Location: [facility];  Service: Thoracic;  Laterality: Right;   PATCH ANGIOPLASTY Right 04/10/2018   Procedure: PATCH  ANGIOPLASTY OF RIGHT FEMORAL ARTERY USING BOVINE PATCH, PATCH ANGIOPLASTY OF RIGHT POPLITEAL ARTERY USING BOVINE PATCH;  Surgeon: Angelia Mould, MD;  Location: [place];  Service: Vascular;  Laterality: Right;   SCHLEROTHERAPY  06/17/2019   Procedure: [place] Ku OF VARICES;  Surgeon: Lavena Bullion, DO;  Location[facility] ENDOSCOPY;  Service: Gastroenterology;;   [place]   "large incision from chest to up to shoulder, the nerves were tied together, for raynaud's   THORACOTOMY  06/07/2013   Procedure: MINI/LIMITED THORACOTOMY; right middle lobe wedge resection;  Surgeon: [name], MD;  Location: [place];  Service: Thoracic;;    TONSILLECTOMY  child   TOTAL ABDOMINAL HYSTERECTOMY  1980's    W/ BSO   TOTAL HIP ARTHROPLASTY Right 04/28/2014   Procedure: RIGHT TOTAL HIP ARTHROPLASTY ANTERIOR APPROACH;  Surgeon: [name], MD;  Location: [facility];  Service: Orthopedics;  Laterality: Right;   TRANSTHORACIC ECHOCARDIOGRAM  12/11/2008   ef 03-50%, grade 1 diastolic dysfunction/  mild LAE/  mild AR and MR/  trivial TR   VIDEO ASSISTED THORACOSCOPY (VATS)/WEDGE RESECTION Right 06/07/2013   Procedure: VIDEO ASSISTED THORACOSCOPY (VATS)/right upper lobectomy, On Q;  Surgeon: [name], MD;  Location: [facility] OR;  Service: Thoracic;  Laterality: Right;   VIDEO BRONCHOSCOPY N/A 06/07/2013   Procedure: VIDEO BRONCHOSCOPY;  Surgeon: [name], MD;  Location: [facility] OR;  Service: Thoracic;  Laterality: N/A;   VIDEO BRONCHOSCOPY WITH ENDOBRONCHIAL NAVIGATION N/A 05/04/2013   Procedure: VIDEO BRONCHOSCOPY WITH ENDOBRONCHIAL NAVIGATION;  Surgeon: [name], MD;  Location: [facility] OR;  Service: Thoracic;  Laterality: N/A;    Social History   Tobacco Use   Smoking status: Never   Smokeless tobacco: Never  Vaping Use   Vaping Use: Never used  Substance Use Topics   Alcohol use: Not Currently   Drug use: Never    Family History  Problem Relation Age of Onset   Coronary artery disease Father    Colon cancer Father    Diabetes Father    Cancer Father        colon   Other Mother 29       MVA   Healthy Sister    Healthy Brother    Healthy Daughter    Hypothyroidism Daughter    Other Brother        killed in war   Pneumonia Sister    Healthy Daughter    Esophageal cancer Neg Hx    Kidney disease Neg Hx    Liver disease Neg Hx     Allergies  Allergen Reactions   Amlodipine Rash   Prochlorperazine Edisylate Anaphylaxis    Compazine--- tongue swells and rash   Aspirin Other (See Comments)    nose bleeds. Cannot take NSAIDS    Cymbalta [Duloxetine Hcl] Diarrhea, Nausea And Vomiting and Other (See  Comments)    Increased blood pressure   Pamelor [Nortriptyline Hcl] Diarrhea and Nausea Only    Increased Heart rate and BP    Medication list has been reviewed and updated.  Current Outpatient Medications on File Prior to Visit  Medication Sig Dispense Refill   Abatacept (ORENCIA IV) Inject into the vein every 28 (twenty-eight) days.     Acetaminophen (TYLENOL EXTRA STRENGTH PO)  Take 1-2 tablets by mouth every 6 (six) hours as needed (pain).      Biotin 1000 MCG tablet Take 1,000 mcg by mouth daily.     Calcium Carbonate-Vitamin D 500-125 MG-UNIT TABS Take 1 tablet by mouth daily.     carvedilol (COREG) 3.125 MG tablet TAKE 1 TABLET (3.125 MG TOTAL) BY MOUTH 2 (TWO) TIMES DAILY. 180 tablet 1   denosumab (PROLIA) 60 MG/ML SOSY injection Inject 60 mg into the skin every 6 (six) months.     docusate sodium (COLACE) 100 MG capsule Take 100 mg by mouth 2 (two) times daily.     gabapentin (NEURONTIN) 300 MG capsule TAKE 1 CAPSULE BY MOUTH IN THE MORNING, 1 IN THE AFTERNOON, AND 2 AT BEDTIME 360 capsule 1   Glucosamine-Chondroit-Vit C-Mn (GLUCOSAMINE CHONDR 1500 COMPLX PO) Take 1 capsule by mouth daily.     levothyroxine (SYNTHROID) 75 MCG tablet TAKE 1 TABLET (75 MCG TOTAL) BY MOUTH DAILY BEFORE BREAKFAST. 90 tablet 1   methocarbamol (ROBAXIN) 500 MG tablet TAKE 1 TABLET BY MOUTH EVERY 6 HOURS AS NEEDED FOR MUSCLE SPASMS. 40 tablet 0   Multiple Vitamin (MULTIVITAMIN) tablet Take 1 tablet by mouth daily.     omeprazole (PRILOSEC) 20 MG capsule TAKE 1 CAPSULE BY MOUTH EVERY DAY 90 capsule 3   Probiotic Product (PROBIOTIC PO) Take 1 capsule by mouth daily.      sulfamethoxazole-trimethoprim (BACTRIM DS) 800-160 MG tablet Take 1 tablet by mouth 2 (two) times daily. 10 tablet 0   traMADol (ULTRAM) 50 MG tablet Take 0.5-1 tablets (25-50 mg total) by mouth every 12 (twelve) hours as needed. For chronic degenerative joint pain 30 tablet 3   VITAMIN D PO Take by mouth daily.     No current  facility-administered medications on file prior to visit.    Review of Systems:  As per HPI- otherwise negative.   Physical Examination: Vitals:   01/17/21 1148  BP: 123/70  Pulse: (!) 107  Resp: 18  Temp: 98.3 F (36.8 C)  SpO2: 98%   Vitals:   01/17/21 1148  Weight: 106 lb 3.2 oz (48.2 kg)   Body mass index is 20.74 kg/m. Ideal Body Weight:    GEN: no acute distress.  Petite build, looks well and her normal self HEENT: Atraumatic, Normocephalic.  Ears and Nose: No external deformity. CV: RRR, No M/G/R. No JVD. No thrill. No extra heart sounds.  Note mild tachycardia.  Patient states she feels well, no shortness of breath or chest pain PULM: CTA B, no wheezes, crackles, rhonchi. No retractions. No resp. distress. No accessory muscle use. ABD: S, NT, ND, +BS. No rebound. No HSM.  Belly is benign EXTR: No c/c/e PSYCH: Normally interactive. Conversant.  Both legs display at most trace pedal edema.  She has strong pulses in the right foot.  She notes discomfort with movement of the right hip, internal and external rotation as well as flexion. There is increased circumference of the right proximal thigh, compared to the left.  This is certainly not as dramatic as back in 2020 but it is present. Pulse Readings from Last 3 Encounters:  01/17/21 (!) 107  12/20/20 68  12/18/20 88    Assessment and Plan: Essential hypertension - Plan: Basic metabolic panel  Fatigue, unspecified type - Plan: CBC  Embolism and thrombosis of artery of lower extremity ([place]) - Plan: CT ANGIO PELVIS W OR WO CONTRAST  Patient seen today with concern of recurrent issue with her right hip.  As above, she has history of right hip replacement followed by dislocation due to a fall and hip surgery revision.  She then suffered a femoral artery pseudoaneurysm which was repaired For about a week she has noted pain in the right hip and upper thigh, difficulty moving the leg.  There is some swelling in the  upper thigh as described above.  We will plan to obtain preliminary lab work and get a CT angiogram of her pelvis and upper thigh to evaluate the joint and also rule out any recurrent vascular issue  Signed Lamar Blinks, MD  Received her labs and CT scan as below, called patient Hyponatremia is chronic and stable Moderate renal insufficiency, also stable Mild anemia is stable Her CT scan shows a nondisplaced pubic rami fracture.  No evidence of pseudoaneurysm recurrence or other complication.  Discussed this fracture with patient, advised that it should heal without further intervention.  Certainly discussed with orthopedics at her appointment on Monday She is using tylenol and tramadol as needed for pain-she feels that her pain is under okay control  Patient states understanding, she will let me know if any concerns or other problems Results for orders placed or performed in visit on 28/36/62  Basic metabolic panel  Result Value Ref Range   Sodium 131 (L) 135 - 145 mEq/L   Potassium 4.1 3.5 - 5.1 mEq/L   Chloride 96 96 - 112 mEq/L   CO2 31 19 - 32 mEq/L   Glucose, Bld 122 (H) 70 - 99 mg/dL   BUN 26 (H) 6 - 23 mg/dL   Creatinine, Ser 1.13 0.40 - 1.20 mg/dL   GFR 43.54 (L) >60.00 mL/min   Calcium 9.7 8.4 - 10.5 mg/dL  CBC  Result Value Ref Range   WBC 5.4 4.0 - 10.5 K/uL   RBC 3.68 (L) 3.87 - 5.11 Mil/uL   Platelets 237.0 150.0 - 400.0 K/uL   Hemoglobin 11.0 (L) 12.0 - 15.0 g/dL   HCT 33.7 (L) 36.0 - 46.0 %   MCV 91.7 78.0 - 100.0 fl   MCHC 32.7 30.0 - 36.0 g/dL   RDW 13.3 11.5 - 15.5 %    CT ANGIO PELVIS W OR WO CONTRAST  Result Date: 01/17/2021 CLINICAL DATA:  Known history of prior right common femoral artery pseudoaneurysm with repair, initial encounter EXAM: CT ANGIOGRAPHY OF PELVIS TECHNIQUE: Multidetector CT imaging of the pelvis was performed using the standard protocol during bolus administration of intravenous contrast. Multiplanar CT image reconstructions and  MIPs were obtained to evaluate the vascular anatomy. CONTRAST:  147mL OMNIPAQUE IOHEXOL 350 MG/ML SOLN COMPARISON:  04/09/2018 FINDINGS: Vascular: Visualized abdominal aorta demonstrates atherosclerotic calcification without aneurysmal dilatation. The iliac vessels are within normal limits. Common femoral artery on the right demonstrates postoperative changes consistent with the known history of prior pseudoaneurysm. No findings to suggest pseudoaneurysm are noted at this time. No areas of active extravasation are seen. Visualized superficial femoral arteries bilaterally are within normal limits. Nonvascular: Visualized portions of the liver, spleen, kidneys and pancreas are within normal limits. Bladder is within normal limits. Visualized bowel shows no obstructive process. No free fluid in the pelvis is seen. Changes of prior right hip replacement are noted. One of the acetabular fixation screws courses adjacent to external iliac artery on the right but is stable in appearance. Degenerative changes of the lower lumbar spine are seen. There is a lucency in the right inferior pubic ramus without associated abnormality in the superior pubic ramus. This would be  consistent with undisplaced fracture. This is best seen on image 122 of series 6. Review of the MIP images confirms the above findings. IMPRESSION: No evidence of recurrent pseudoaneurysm. Undisplaced right inferior pubic ramus fracture new from the prior CT but of uncertain chronicity. Status post right hip replacement with 1 of the acetabular fixation screws traversing adjacent to right external iliac artery but stable in appearance from the prior exam. Electronically Signed   By: Inez Catalina M.D.   On: 01/17/2021 18:32

## 2021-01-19 ENCOUNTER — Other Ambulatory Visit: Payer: Self-pay | Admitting: Orthopaedic Surgery

## 2021-01-21 ENCOUNTER — Ambulatory Visit (INDEPENDENT_AMBULATORY_CARE_PROVIDER_SITE_OTHER): Payer: Medicare PPO

## 2021-01-21 ENCOUNTER — Telehealth: Payer: Self-pay | Admitting: Orthopaedic Surgery

## 2021-01-21 ENCOUNTER — Ambulatory Visit: Payer: Medicare PPO | Admitting: Orthopaedic Surgery

## 2021-01-21 DIAGNOSIS — M25551 Pain in right hip: Secondary | ICD-10-CM | POA: Diagnosis not present

## 2021-01-21 MED ORDER — HYDROCODONE-ACETAMINOPHEN 5-325 MG PO TABS: 1.0000 | ORAL_TABLET | Freq: Four times a day (QID) | ORAL | 0 refills | Status: DC | PRN

## 2021-01-21 NOTE — Telephone Encounter (Signed)
Pt just left from her appt and wanted to make sure her prescription of hydrocodone was going to be called in for her as she did not get a paper copy of it to take to her pharmacy before hand. The best pharmacy on file is [id number] - [place], [place] and the best call back number if needed is [phone].

## 2021-01-21 NOTE — Telephone Encounter (Signed)
Called pt and advised it was sent in and stated understanding

## 2021-01-21 NOTE — Progress Notes (Signed)
The patient is a 87 year old female well-known to me.  We replaced her hip back in 2019 and then she had some hard mechanical falls causing a fracture of the greater trochanter and dislocation of the hip.  3 years ago she had revision surgery with a higher offset liner and a longer hip ball.  She has been stable since then.  She is never had a history instead of any dislocation.  She did have something happen to her hip recently when she was doing a lot of work at home.  She does ambulate slowly with a rolling walker and has had some groin pain.  She has a history of aneurysm but that was stable and a CT angiogram showed the hip itself.  There was potentially a inferior ramus fracture on the right side.  She does hurt with weightbearing.  She does not feel like the hip is come out of place or is coming out of place.  I did put her hip through internal/external rotation and feel stable.  A standing AP pelvis and lateral of the right hip shows no acute findings.  I did review the CT angiogram and see with the radiologist pointing to in terms of potentially a inferior rami fracture and that could be causing some of her pain.  I want her to go slow and to weight-bear as tolerated.  I will send in some hydrocodone for her.  I would like to see her back in 4 weeks to see how she is doing overall and I would like an AP pelvis with her in a supine position lying down for that x-ray.  All questions and concerns were answered and addressed.

## 2021-01-23 ENCOUNTER — Other Ambulatory Visit: Payer: Self-pay

## 2021-01-23 ENCOUNTER — Encounter (HOSPITAL_COMMUNITY)
Admission: RE | Admit: 2021-01-23 | Discharge: 2021-01-23 | Disposition: A | Discharge status: Home / Self Care | Payer: Medicare PPO | Source: Ambulatory Visit | Attending: Rheumatology | Admitting: Rheumatology

## 2021-01-23 DIAGNOSIS — Z79899 Other long term (current) drug therapy: Secondary | ICD-10-CM | POA: Insufficient documentation

## 2021-01-23 DIAGNOSIS — M0609 Rheumatoid arthritis without rheumatoid factor, multiple sites: Secondary | ICD-10-CM | POA: Insufficient documentation

## 2021-01-23 DIAGNOSIS — M810 Age-related osteoporosis without current pathological fracture: Secondary | ICD-10-CM | POA: Diagnosis not present

## 2021-01-23 MED ORDER — DIPHENHYDRAMINE HCL 25 MG PO CAPS: 25.0000 mg | ORAL_CAPSULE | ORAL | Status: DC

## 2021-01-23 MED ORDER — DENOSUMAB 60 MG/ML ~~LOC~~ SOSY: 60.0000 mg | PREFILLED_SYRINGE | Freq: Once | SUBCUTANEOUS | Status: DC

## 2021-01-23 MED ORDER — ACETAMINOPHEN 325 MG PO TABS: 650.0000 mg | ORAL_TABLET | ORAL | Status: DC

## 2021-01-23 MED ORDER — SODIUM CHLORIDE 0.9 % IV SOLN
500.0000 mg | INTRAVENOUS | Status: DC
  Administered 2021-01-23: 500 mg via INTRAVENOUS
  Filled 2021-01-23: qty 20

## 2021-01-29 ENCOUNTER — Encounter: Payer: Self-pay | Admitting: Family Medicine

## 2021-01-29 DIAGNOSIS — L03119 Cellulitis of unspecified part of limb: Secondary | ICD-10-CM

## 2021-01-29 MED ORDER — DOXYCYCLINE HYCLATE 100 MG PO CAPS
100.0000 mg | ORAL_CAPSULE | Freq: Two times a day (BID) | ORAL | 0 refills | Status: DC
Start: 2021-01-29 — End: 2021-04-29

## 2021-01-29 NOTE — Telephone Encounter (Signed)
Called pt at 8:30 pm- she notes that she is not having any flu like sx She just re-checked her temp and it was back to 98. She will start on doxy first thing tomorrow am She promises to let me know if not responding and improving soon  If she should get worse or have fevers advised to go to the ER or contact me if during business day  JC  Meds ordered this encounter  Medications   doxycycline (VIBRAMYCIN) 100 MG capsule    Sig: Take 1 capsule (100 mg total) by mouth 2 (two) times daily.    Dispense:  20 capsule    Refill:  0

## 2021-01-30 ENCOUNTER — Encounter: Payer: Self-pay | Admitting: Family Medicine

## 2021-02-13 ENCOUNTER — Telehealth: Payer: Self-pay | Admitting: Pharmacy Technician

## 2021-02-13 NOTE — Telephone Encounter (Signed)
Submitted a Prior Authorization request to [facility] for [name] via CoverMyMeds. Will update once we receive a response.   KeyMilon Dikes - PA Case ID: [id number]

## 2021-02-14 NOTE — Telephone Encounter (Signed)
Received notification from [facility] regarding a prior authorization for [facility]. Authorization has been APPROVED from 02/17/2021 to 02/16/2022.   Updated [employer] with current Prior Authorization information.  Authorization # [id number]

## 2021-02-18 ENCOUNTER — Other Ambulatory Visit: Payer: Self-pay | Admitting: Cardiology

## 2021-02-19 ENCOUNTER — Ambulatory Visit: Payer: Medicare PPO | Admitting: Orthopaedic Surgery

## 2021-02-19 ENCOUNTER — Encounter: Payer: Self-pay | Admitting: Family Medicine

## 2021-02-19 DIAGNOSIS — Z1231 Encounter for screening mammogram for malignant neoplasm of breast: Secondary | ICD-10-CM

## 2021-02-19 NOTE — Telephone Encounter (Signed)
Pt c/o medication issue:  1. Name of Medication: Carvedilol  2. How are you currently taking this medication (dosage and times per day)?  carvedilol (COREG) 3.125 MG tablet [[id number]]  3. Are you having a reaction (difficulty breathing--STAT)? no  4. What is your medication issue? Pt is completely out of this med and requesting for it to be called in today.

## 2021-02-20 ENCOUNTER — Encounter (HOSPITAL_COMMUNITY)
Admission: RE | Admit: 2021-02-20 | Discharge: 2021-02-20 | Disposition: A | Discharge status: Home / Self Care | Payer: Medicare PPO | Source: Ambulatory Visit | Attending: Rheumatology | Admitting: Rheumatology

## 2021-02-20 ENCOUNTER — Other Ambulatory Visit (HOSPITAL_COMMUNITY): Payer: Self-pay

## 2021-02-20 DIAGNOSIS — M0609 Rheumatoid arthritis without rheumatoid factor, multiple sites: Secondary | ICD-10-CM | POA: Insufficient documentation

## 2021-02-20 MED ORDER — DIPHENHYDRAMINE HCL 25 MG PO CAPS: 25.0000 mg | ORAL_CAPSULE | ORAL | Status: DC

## 2021-02-20 MED ORDER — SODIUM CHLORIDE 0.9 % IV SOLN
500.0000 mg | INTRAVENOUS | Status: DC
  Administered 2021-02-20: 500 mg via INTRAVENOUS
  Filled 2021-02-20: qty 20

## 2021-02-20 MED ORDER — ACETAMINOPHEN 325 MG PO TABS: 650.0000 mg | ORAL_TABLET | ORAL | Status: DC

## 2021-02-28 ENCOUNTER — Ambulatory Visit: Payer: Medicare PPO | Admitting: Family Medicine

## 2021-02-28 ENCOUNTER — Telehealth: Payer: Self-pay

## 2021-02-28 NOTE — Telephone Encounter (Signed)
3 PM appointment:  Pt called and says she has been elevating her feet above her heart and wearing compression stockings that come above her knees today. The lump and edema has resolved some. She will contact the office at the beginning of the week if her sxs worsen or change.  CB number: [phone]

## 2021-02-28 NOTE — Progress Notes (Deleted)
[place] [facility] [address], [place], [place] [address] [phone]  Date:  02/28/2021   Name:  [name]   DOB:  12-22-1930   MRN:  [id number]  PCP:  Darreld Mclean, MD    Chief Complaint: No chief complaint on file.   History of Present Illness:  [name] is a 88 y.o. very pleasant female patient who presents with the following:  Patient seen today with concern of possible soft tissue infection History of hypertension, lung cancer, Sjogren's syndrome, osteoporosis, complications from total hip replacement, hypothyroidism, discoid lupus, rheumatoid arthritis She is treated with Orencia per per Deveshwar  Most recent visit with myself December 1-that time she had concern of right hip pain.  We were worried due to her history of femoral pseudoaneurysm in 2020 However, her CT scan at that time revealed a pelvic fragility fracture of the right inferior pubic ramus  She has been to see orthopedics, Dr. Ninfa Linden for same-fracture stable, expected to heal up okay with conservative treatment  Patient contacted me yesterday: The wound on my left leg has not healed yet and is red, a little swollen, and throbs. And the right leg, where I had the femoral aneurysm as well as the lower part of the leg where I had the smaller blood clot that they removed;  is painful in both of those locations and {even though I try to elevate the leg at times throughout the day] it gets very swollen and hard by the end of the day! It has been a few days after Thanksgiving since I had the scan;  finished the bottle of Doxycycline;  and last saw you;  so I feel  that you probably should take a look at both legs and see what you recommend! Patient Active Problem List   Diagnosis Date Noted   Rectal pain 10/10/2020   Acute posthemorrhagic anemia    Rectal bleeding 06/17/2019   Grade II internal hemorrhoids    Rectal ulcer    Popliteal  artery occlusion, right ([place]) 04/10/2018   HTN (hypertension) 04/10/2018   Femoral artery pseudo-aneurysm, 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/09/2018   Anemia of chronic disease 02/24/2018   Unstable right hip arthroplasty 01/24/2018   History of revision of total replacement of right hip joint 01/24/2018   Hypovolemic shock ([place])    Hyperkalemia    Hyponatremia    Failed total hip arthroplasty ([place]) 11/27/2017   Status post revision of total hip 11/27/2017   Elevated cholesterol 10/11/2015   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[place]) 10/04/2014   Bilateral leg edema 08/01/2014   Elevated BP 08/01/2014   Rheumatoid arthritis involving multiple joints ([place]) 05/30/2014   Status post total replacement of right hip 04/28/2014   Long-term use of high-risk medication 11/11/2013   Symptomatic anemia 11/11/2013   Neuropathic pain 07/07/2013   Constipation due to pain medication 07/07/2013   Protein-calorie malnutrition, severe ([place]) 06/08/2013   Lung cancer, Right upper lobe 05/08/2013   Sciatica of right side 08/13/2011   Osteoporosis 03/07/2010   PARESTHESIA 03/07/2010   CT, CHEST, ABNORMAL 12/18/2008   ABNORMAL ECHOCARDIOGRAM 12/14/2008   SJOGREN'S SYNDROME 11/29/2008   HYPOGLYCEMIA 06/29/2006   RAYNAUD'S DISEASE 06/29/2006    Past Medical History:  Diagnosis Date   Arthralgia of multiple joints    followed by dr Gerilyn Nestle   Arthritis    Cardiomyopathy ([place])    Chronic constipation    Chronic inflammatory arthritis  rhemotolgist-  dr a. Gerilyn Nestle ([facility])   Dry eyes    eye drops used    GERD (gastroesophageal reflux disease)    H/O discoid lupus erythematosus    Hiatal hernia    History of colon polyps    Hypothyroidism    Iron deficiency anemia    LBBB (left bundle branch block) 2010   Mitchell's disease (erythromelalgia) [facility])    neurologist-  dr patel   Nocturia    Non-small cell cancer of right lung [facility]) surgeon-- dr gerhardt/  oncologist-  dr Julien Nordmann--- per lov notes no  recurrence/   11-18-2017 per pt denies any symptoms   dx 2015--  Stage IIA (T2b,N0,M0) , +EGFR  mutation in exon 21, non-small cell adenocarcinoma right upper lobe---  s/p  Right upper lobectomy , right middley wedge resection and node dissection---  no chemo or radiation therapy   OA (osteoarthritis)    hands   Osteoporosis    PONV (postoperative nausea and vomiting)    likes phenergan   Raynaud's phenomenon 1965   Renal insufficiency    Rheumatoid arthritis ([place])    Sciatica    Scoliosis    Sjogren's syndrome [facility] [place])     Past Surgical History:  Procedure Laterality Date   ANTERIOR HIP REVISION Right 11/27/2017   Procedure: RIGHT HIP ACETABULAR REVISION;  Surgeon: [name], MD;  [facility];  Service: Orthopedics;  Laterality: Right;   ANTERIOR HIP REVISION Right 01/24/2018   Procedure: OPEN REDUCTION OF DISLOCATED ANTERIOR HIP WITH REVISION OF LINER AND HIP BALL;  Surgeon: [name], MD;  [facility];  Service: Orthopedics;  Laterality: Right;   APPENDECTOMY  1950s   BIOPSY  04/14/2018   Procedure: BIOPSY;  Surgeon: Yetta Flock, MD;  Location: [place];  Service: Gastroenterology;;   BIOPSY  04/16/2018   Procedure: BIOPSY;  Surgeon: Irving Copas., MD;  Location: [place];  Service: Gastroenterology;;   CARDIOVASCULAR STRESS TEST  12/2008    mild fixed basal to mid septal perfusion defect felt likely due to artifact from LBBB, no ischemia, EF 58%   COLONOSCOPY     COLONOSCOPY WITH PROPOFOL N/A 04/16/2018   Procedure: COLONOSCOPY WITH PROPOFOL;  Surgeon: Irving Copas., MD;  Location: [place];  Service: Gastroenterology;  Laterality: N/A;   ESOPHAGOGASTRODUODENOSCOPY (EGD) WITH PROPOFOL N/A 04/14/2018   Procedure: ESOPHAGOGASTRODUODENOSCOPY (EGD) WITH PROPOFOL;  Surgeon: Yetta Flock, MD;  Location: [place];  Service: Gastroenterology;  Laterality: N/A;   FEMORAL-POPLITEAL BYPASS GRAFT Right  04/10/2018   Procedure: REPAIR RIGHT FEMORAL ARTERY PSEUDOANEURYSM, RETROPERITONEAL EXPOSURE OF ILIAC ARTERY, RIGHT POPLITEAL EMBOLECTOMY;  Surgeon: Angelia Mould, MD;  Location: [place];  Service: Vascular;  Laterality: Right;   FLEXIBLE SIGMOIDOSCOPY N/A 06/17/2019   Procedure: FLEXIBLE SIGMOIDOSCOPY;  Surgeon: Lavena Bullion, DO;  [facility];  Service: Gastroenterology;  Laterality: N/A;   HEMOSTASIS CLIP PLACEMENT  06/17/2019   Procedure: HEMOSTASIS CLIP PLACEMENT;  Surgeon: Lavena Bullion, DO;  [facility];  Service: Gastroenterology;;   LYMPH NODE DISSECTION Right 06/07/2013   Procedure: LYMPH NODE DISSECTION;  Surgeon: [name], MD;  Location: [place];  Service: Thoracic;  Laterality: Right;   PATCH ANGIOPLASTY Right 04/10/2018   Procedure: PATCH  ANGIOPLASTY OF RIGHT FEMORAL ARTERY USING BOVINE PATCH, PATCH ANGIOPLASTY OF RIGHT POPLITEAL ARTERY USING BOVINE PATCH;  Surgeon: Angelia Mould, MD;  Location: [place];  Service: Vascular;  Laterality: Right;   SCHLEROTHERAPY  06/17/2019  Procedure: SCHLEROTHERAPY OF VARICES;  Surgeon: Lavena Bullion, DO;  [facility];  Service: Gastroenterology;;   [place]   "large incision from chest to up to shoulder, the nerves were tied together, for raynaud's   THORACOTOMY  06/07/2013   Procedure: MINI/LIMITED THORACOTOMY; right middle lobe wedge resection;  Surgeon: [name], MD;  Location: [place];  Service: Thoracic;;   TONSILLECTOMY  child   TOTAL ABDOMINAL HYSTERECTOMY  1980's    W/ BSO   TOTAL HIP ARTHROPLASTY Right 04/28/2014   Procedure: RIGHT TOTAL HIP ARTHROPLASTY ANTERIOR APPROACH;  Surgeon: [name], MD;  [facility];  Service: Orthopedics;  Laterality: Right;   TRANSTHORACIC ECHOCARDIOGRAM  12/11/2008   ef 18-56%, grade 1 diastolic dysfunction/  mild LAE/  mild AR and MR/  trivial TR   VIDEO ASSISTED THORACOSCOPY (VATS)/WEDGE  RESECTION Right 06/07/2013   Procedure: VIDEO ASSISTED THORACOSCOPY (VATS)/right upper lobectomy, On Q;  Surgeon: [name], MD;  [facility];  Service: Thoracic;  Laterality: Right;   VIDEO BRONCHOSCOPY N/A 06/07/2013   Procedure: VIDEO BRONCHOSCOPY;  Surgeon: [name], MD;  [facility];  Service: Thoracic;  Laterality: N/A;   VIDEO BRONCHOSCOPY WITH ENDOBRONCHIAL NAVIGATION N/A 05/04/2013   Procedure: VIDEO BRONCHOSCOPY WITH ENDOBRONCHIAL NAVIGATION;  Surgeon: [name], MD;  [facility];  Service: Thoracic;  Laterality: N/A;    Social History   Tobacco Use   Smoking status: Never   Smokeless tobacco: Never  Vaping Use   Vaping Use: Never used  Substance Use Topics   Alcohol use: Not Currently   Drug use: Never    Family History  Problem Relation Age of Onset   Coronary artery disease Father    Colon cancer Father    Diabetes Father    Cancer Father        colon   Other Mother 68       MVA   Healthy Sister    Healthy Brother    Healthy Daughter    Hypothyroidism Daughter    Other Brother        killed in war   Pneumonia Sister    Healthy Daughter    Esophageal cancer Neg Hx    Kidney disease Neg Hx    Liver disease Neg Hx     Allergies  Allergen Reactions   Amlodipine Rash   Prochlorperazine Edisylate Anaphylaxis    Compazine--- tongue swells and rash   Aspirin Other (See Comments)    nose bleeds. Cannot take NSAIDS    Cymbalta [Duloxetine Hcl] Diarrhea, Nausea And Vomiting and Other (See Comments)    Increased blood pressure   Pamelor [Nortriptyline Hcl] Diarrhea and Nausea Only    Increased Heart rate and BP    Medication list has been reviewed and updated.  Current Outpatient Medications on File Prior to Visit  Medication Sig Dispense Refill   Abatacept (ORENCIA IV) Inject into the vein every 28 (twenty-eight) days.     Acetaminophen (TYLENOL EXTRA STRENGTH PO) Take 1-2 tablets by mouth every 6 (six) hours as needed  (pain).      Biotin 1000 MCG tablet Take 1,000 mcg by mouth daily.     Calcium Carbonate-Vitamin D 500-125 MG-UNIT TABS Take 1 tablet by mouth daily.     carvedilol (COREG) 3.125 MG tablet Take 1 tablet (3.125 mg total) by mouth 2 (two) times daily with a meal. KEEP OV. 60 tablet 1   denosumab (PROLIA) 60 MG/ML  SOSY injection Inject 60 mg into the skin every 6 (six) months.     docusate sodium (COLACE) 100 MG capsule Take 100 mg by mouth 2 (two) times daily.     doxycycline (VIBRAMYCIN) 100 MG capsule Take 1 capsule (100 mg total) by mouth 2 (two) times daily. 20 capsule 0   gabapentin (NEURONTIN) 300 MG capsule TAKE 1 CAPSULE BY MOUTH IN THE MORNING, 1 IN THE AFTERNOON, AND 2 AT BEDTIME 360 capsule 1   Glucosamine-Chondroit-Vit C-Mn (GLUCOSAMINE CHONDR 1500 COMPLX PO) Take 1 capsule by mouth daily.     HYDROcodone-acetaminophen (NORCO/VICODIN) 5-325 MG tablet Take 1 tablet by mouth every 6 (six) hours as needed for moderate pain. 30 tablet 0   levothyroxine (SYNTHROID) 75 MCG tablet TAKE 1 TABLET (75 MCG TOTAL) BY MOUTH DAILY BEFORE BREAKFAST. 90 tablet 1   methocarbamol (ROBAXIN) 500 MG tablet TAKE 1 TABLET BY MOUTH EVERY 6 HOURS AS NEEDED FOR MUSCLE SPASMS. 40 tablet 0   Multiple Vitamin (MULTIVITAMIN) tablet Take 1 tablet by mouth daily.     omeprazole (PRILOSEC) 20 MG capsule TAKE 1 CAPSULE BY MOUTH EVERY DAY 90 capsule 3   Probiotic Product (PROBIOTIC PO) Take 1 capsule by mouth daily.      sulfamethoxazole-trimethoprim (BACTRIM DS) 800-160 MG tablet Take 1 tablet by mouth 2 (two) times daily. 10 tablet 0   traMADol (ULTRAM) 50 MG tablet Take 0.5-1 tablets (25-50 mg total) by mouth every 12 (twelve) hours as needed. For chronic degenerative joint pain 30 tablet 3   VITAMIN D PO Take by mouth daily.     No current facility-administered medications on file prior to visit.    Review of Systems:  As per HPI- otherwise negative.   Physical Examination: There were no vitals filed for this  visit. There were no vitals filed for this visit. There is no height or weight on file to calculate BMI. Ideal Body Weight:    GEN: no acute distress. HEENT: Atraumatic, Normocephalic.  Ears and Nose: No external deformity. CV: RRR, No M/G/R. No JVD. No thrill. No extra heart sounds. PULM: CTA B, no wheezes, crackles, rhonchi. No retractions. No resp. distress. No accessory muscle use. ABD: S, NT, ND, +BS. No rebound. No HSM. EXTR: No c/c/e PSYCH: Normally interactive. Conversant.    Assessment and Plan: ***  Signed Lamar Blinks, MD

## 2021-03-08 NOTE — Progress Notes (Deleted)
Office Visit Note  Patient: [name]             Date of Birth: 01-Jun-1930           MRN: [id number]             PCP: Darreld Mclean, MD Referring: Darreld Mclean, MD Visit Date: 03/21/2021 Occupation: @GUAROCC @  Subjective:  No chief complaint on file.   History of Present Illness: [name] is a 88 y.o. female ***   Activities of Daily Living:  Patient reports morning stiffness for *** {minute/hour:19697}.   Patient {ACTIONS;DENIES/REPORTS:21021675::"Denies"} nocturnal pain.  Difficulty dressing/grooming: {ACTIONS;DENIES/REPORTS:21021675::"Denies"} Difficulty climbing stairs: {ACTIONS;DENIES/REPORTS:21021675::"Denies"} Difficulty getting out of chair: {ACTIONS;DENIES/REPORTS:21021675::"Denies"} Difficulty using hands for taps, buttons, cutlery, and/or writing: {ACTIONS;DENIES/REPORTS:21021675::"Denies"}  No Rheumatology ROS completed.   PMFS History:  Patient Active Problem List   Diagnosis Date Noted   Rectal pain 10/10/2020   Acute posthemorrhagic anemia    Rectal bleeding 06/17/2019   Grade II internal hemorrhoids    Rectal ulcer    Popliteal artery occlusion, right ([place]) 04/10/2018   HTN (hypertension) 04/10/2018   Femoral artery pseudo-aneurysm, 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/09/2018   Anemia of chronic disease 02/24/2018   Unstable right hip arthroplasty 01/24/2018   History of revision of total replacement of right hip joint 01/24/2018   Hypovolemic shock ([place])    Hyperkalemia    Hyponatremia    Failed total hip arthroplasty ([place]) 11/27/2017   Status post revision of total hip 11/27/2017   Elevated cholesterol 10/11/2015   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[place]) 10/04/2014   Bilateral leg edema 08/01/2014   Elevated BP 08/01/2014   Rheumatoid arthritis involving multiple joints ([place]) 05/30/2014   Status post total replacement of right hip 04/28/2014   Long-term use of high-risk medication 11/11/2013   Symptomatic anemia 11/11/2013   Neuropathic pain  07/07/2013   Constipation due to pain medication 07/07/2013   Protein-calorie malnutrition, severe ([place]) 06/08/2013   Lung cancer, Right upper lobe 05/08/2013   Sciatica of right side 08/13/2011   Osteoporosis 03/07/2010   PARESTHESIA 03/07/2010   CT, CHEST, ABNORMAL 12/18/2008   ABNORMAL ECHOCARDIOGRAM 12/14/2008   SJOGREN'S SYNDROME 11/29/2008   HYPOGLYCEMIA 06/29/2006   RAYNAUD'S DISEASE 06/29/2006    Past Medical History:  Diagnosis Date   Arthralgia of multiple joints    followed by dr Gerilyn Nestle   Arthritis    Cardiomyopathy ([place])    Chronic constipation    Chronic inflammatory arthritis    rhemotolgist-  dr a. Gerilyn Nestle ([facility])   Dry eyes    eye drops used    GERD (gastroesophageal reflux disease)    H/O discoid lupus erythematosus    Hiatal hernia    History of colon polyps    Hypothyroidism    Iron deficiency anemia    LBBB (left bundle branch block) 2010   Mitchell's disease (erythromelalgia) [facility])    neurologist-  dr patel   Nocturia    Non-small cell cancer of right lung [facility]) surgeon-- dr gerhardt/  oncologist-  dr Julien Nordmann--- per lov notes no recurrence/   11-18-2017 per pt denies any symptoms   dx 2015--  Stage IIA (T2b,N0,M0) , +EGFR  mutation in exon 21, non-small cell adenocarcinoma right upper lobe---  s/p  Right upper lobectomy , right middley wedge resection and node dissection---  no chemo or radiation therapy   OA (osteoarthritis)    hands   Osteoporosis    PONV (postoperative nausea and vomiting)    likes phenergan  Raynaud's phenomenon 1965   Renal insufficiency    Rheumatoid arthritis ([place])    Sciatica    Scoliosis    Sjogren's syndrome ([place])     Family History  Problem Relation Age of Onset   Coronary artery disease Father    Colon cancer Father    Diabetes Father    Cancer Father        colon   Other Mother 40       MVA   Healthy Sister    Healthy Brother    Healthy Daughter    Hypothyroidism Daughter    Other Brother         killed in war   Pneumonia Sister    Healthy Daughter    Esophageal cancer Neg Hx    Kidney disease Neg Hx    Liver disease Neg Hx    Past Surgical History:  Procedure Laterality Date   ANTERIOR HIP REVISION Right 11/27/2017   Procedure: RIGHT HIP ACETABULAR REVISION;  Surgeon: [name], MD;  [facility];  Service: Orthopedics;  Laterality: Right;   ANTERIOR HIP REVISION Right 01/24/2018   Procedure: OPEN REDUCTION OF DISLOCATED ANTERIOR HIP WITH REVISION OF LINER AND HIP BALL;  Surgeon: [name], MD;  [facility];  Service: Orthopedics;  Laterality: Right;   APPENDECTOMY  1950s   BIOPSY  04/14/2018   Procedure: BIOPSY;  Surgeon: Yetta Flock, MD;  Location: [place];  Service: Gastroenterology;;   BIOPSY  04/16/2018   Procedure: BIOPSY;  Surgeon: Irving Copas., MD;  Location: [place];  Service: Gastroenterology;;   CARDIOVASCULAR STRESS TEST  12/2008    mild fixed basal to mid septal perfusion defect felt likely due to artifact from LBBB, no ischemia, EF 58%   COLONOSCOPY     COLONOSCOPY WITH PROPOFOL N/A 04/16/2018   Procedure: COLONOSCOPY WITH PROPOFOL;  Surgeon: Irving Copas., MD;  Location: [place];  Service: Gastroenterology;  Laterality: N/A;   ESOPHAGOGASTRODUODENOSCOPY (EGD) WITH PROPOFOL N/A 04/14/2018   Procedure: ESOPHAGOGASTRODUODENOSCOPY (EGD) WITH PROPOFOL;  Surgeon: Yetta Flock, MD;  Location: [place];  Service: Gastroenterology;  Laterality: N/A;   FEMORAL-POPLITEAL BYPASS GRAFT Right 04/10/2018   Procedure: REPAIR RIGHT FEMORAL ARTERY PSEUDOANEURYSM, RETROPERITONEAL EXPOSURE OF ILIAC ARTERY, RIGHT POPLITEAL EMBOLECTOMY;  Surgeon: Angelia Mould, MD;  Location: [place];  Service: Vascular;  Laterality: Right;   FLEXIBLE SIGMOIDOSCOPY N/A 06/17/2019   Procedure: FLEXIBLE SIGMOIDOSCOPY;  Surgeon: Lavena Bullion, DO;  [facility];  Service: Gastroenterology;   Laterality: N/A;   HEMOSTASIS CLIP PLACEMENT  06/17/2019   Procedure: HEMOSTASIS CLIP PLACEMENT;  Surgeon: Lavena Bullion, DO;  [facility];  Service: Gastroenterology;;   LYMPH NODE DISSECTION Right 06/07/2013   Procedure: LYMPH NODE DISSECTION;  Surgeon: [name], MD;  Location: [place];  Service: Thoracic;  Laterality: Right;   PATCH ANGIOPLASTY Right 04/10/2018   Procedure: PATCH  ANGIOPLASTY OF RIGHT FEMORAL ARTERY USING BOVINE PATCH, PATCH ANGIOPLASTY OF RIGHT POPLITEAL ARTERY USING BOVINE PATCH;  Surgeon: Angelia Mould, MD;  Location: [place];  Service: Vascular;  Laterality: Right;   SCHLEROTHERAPY  06/17/2019   Procedure: [place] Ku OF VARICES;  Surgeon: Lavena Bullion, DO;  [facility];  Service: Gastroenterology;;   [place]   "large incision from chest to up to shoulder, the nerves were tied together, for raynaud's   THORACOTOMY  06/07/2013   Procedure: MINI/LIMITED THORACOTOMY; right middle lobe wedge resection;  Surgeon: Lilia Argue  Servando Snare, MD;  Location: [place];  Service: Thoracic;;   TONSILLECTOMY  child   TOTAL ABDOMINAL HYSTERECTOMY  1980's    W/ BSO   TOTAL HIP ARTHROPLASTY Right 04/28/2014   Procedure: RIGHT TOTAL HIP ARTHROPLASTY ANTERIOR APPROACH;  Surgeon: [name], MD;  [facility];  Service: Orthopedics;  Laterality: Right;   TRANSTHORACIC ECHOCARDIOGRAM  12/11/2008   ef 41-63%, grade 1 diastolic dysfunction/  mild LAE/  mild AR and MR/  trivial TR   VIDEO ASSISTED THORACOSCOPY (VATS)/WEDGE RESECTION Right 06/07/2013   Procedure: VIDEO ASSISTED THORACOSCOPY (VATS)/right upper lobectomy, On Q;  Surgeon: [name], MD;  Location: [place];  Service: Thoracic;  Laterality: Right;   VIDEO BRONCHOSCOPY N/A 06/07/2013   Procedure: VIDEO BRONCHOSCOPY;  Surgeon: [name], MD;  Location: [place];  Service: Thoracic;  Laterality: N/A;   VIDEO BRONCHOSCOPY WITH ENDOBRONCHIAL NAVIGATION N/A  05/04/2013   Procedure: VIDEO BRONCHOSCOPY WITH ENDOBRONCHIAL NAVIGATION;  Surgeon: [name], MD;  Location: [place];  Service: Thoracic;  Laterality: N/A;   Social History   Social History Narrative   Lives with husband, daughter and grandchild local.   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 History  Administered Date(s) Administered   Fluad Quad(high Dose 65+) 11/03/2018, 12/14/2019   Influenza Split 11/21/2011   Influenza Whole 11/29/2007, 11/29/2008, 11/29/2009   Influenza, High Dose Seasonal PF 10/30/2012, 01/02/2015, 11/13/2016, 11/12/2017   Influenza,inj,Quad PF,6+ Mos 11/22/2013, 11/14/2015   Influenza-Unspecified 11/13/2016, 11/12/2017, 12/14/2020   PFIZER(Purple Top)SARS-COV-2 Vaccination 04/12/2019, 05/03/2019, 01/20/2020, 12/14/2020   Pneumococcal Conjugate-13 05/22/2015   Pneumococcal Polysaccharide-23 06/13/2013   Tdap 08/17/2017   Zoster, Live 01/27/2014     Objective: Vital Signs: There were no vitals taken for this visit.   Physical Exam   Musculoskeletal Exam: ***  CDAI Exam: CDAI Score: -- Patient Global: --; Provider Global: -- Swollen: --; Tender: -- Joint Exam 03/21/2021   No joint exam has been documented for this visit   There is currently no information documented on the homunculus. Go to the Rheumatology activity and complete the homunculus joint exam.  Investigation: No additional findings.  Imaging: No results found.  Recent Labs: Lab Results  Component Value Date   WBC 5.4 01/17/2021   HGB 11.0 (L) 01/17/2021   PLT 237.0 01/17/2021   NA 131 (L) 01/17/2021   K 4.1 01/17/2021   CL 96 01/17/2021   CO2 31 01/17/2021   GLUCOSE 122 (H) 01/17/2021   BUN 26 (H) 01/17/2021   CREATININE 1.13 01/17/2021   BILITOT 0.7 11/21/2020   ALKPHOS 36 (L) 11/21/2020   AST 29 11/21/2020   ALT 15 11/21/2020   PROT 6.6 11/21/2020   ALBUMIN 3.9 11/21/2020   CALCIUM 9.7 01/17/2021   GFRAA 60  05/21/2020   QFTBGOLDPLUS Negative 07/04/2020    Speciality Comments: MTX, Arava-side effects Orencia-07/21 Prolia - 04/22/19, 11/09/19, 06/06/20   Fu q 3 months  Procedures:  No procedures performed Allergies: Amlodipine, Prochlorperazine edisylate, Aspirin, Cymbalta [duloxetine hcl], and Pamelor [nortriptyline hcl]   Assessment / Plan:     Visit Diagnoses: Rheumatoid arthritis of multiple sites with negative rheumatoid factor ([place])  High risk medication use  Sicca syndrome ([place])  Raynaud's disease without gangrene  Hair loss  Status post total replacement of right hip  Other idiopathic scoliosis, thoracolumbar region  Age-related osteoporosis without current pathological fracture  Vitamin D deficiency  Height loss  Malignant neoplasm of upper lobe of right lung (  [place])  Elevated cholesterol  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HCC)  Anemia of chronic disease  Essential hypertension  Orders: No orders of the defined types were placed in this encounter.  No orders of the defined types were placed in this encounter.   Face-to-face time spent with patient was *** minutes. Greater than 50% of time was spent in counseling and coordination of care.  Follow-Up Instructions: No follow-ups on file.   Ofilia Neas, PA-C  Note - This record has been created using Dragon software.  Chart creation errors have been sought, but may not always  have been located. Such creation errors do not reflect on  the standard of medical care.

## 2021-03-15 ENCOUNTER — Other Ambulatory Visit: Payer: Self-pay | Admitting: Pharmacist

## 2021-03-15 DIAGNOSIS — M0609 Rheumatoid arthritis without rheumatoid factor, multiple sites: Secondary | ICD-10-CM

## 2021-03-15 DIAGNOSIS — Z79899 Other long term (current) drug therapy: Secondary | ICD-10-CM

## 2021-03-15 NOTE — Progress Notes (Signed)
Next infusion scheduled for Orencia IV on 03/20/21 and due for updated orders. Diagnosis: RA  Dose: 500mg  every 28 days (appropriate based on last recorded weight of 47.2 kg)  Last Clinic Visit: 12/18/20 Next Clinic Visit: 03/21/21  Last infusion: 02/20/21  Labs: CBC on 01/17/21 and BMP on 01/17/21 - stable TB Gold: negative on 07/04/20   Orders placed for Orencia IV x 3 doses along with premedication of acetaminophen and diphenhydramine to be administered 30 minutes before medication infusion.  Standing CBC with diff/platelet and CMP with GFR orders placed to be drawn every 2 months.  Next TB gold due 07/04/21  Knox Saliva, PharmD, MPH, BCPS Clinical Pharmacist (Rheumatology and Pulmonology)

## 2021-03-20 ENCOUNTER — Ambulatory Visit (HOSPITAL_COMMUNITY)
Admission: RE | Admit: 2021-03-20 | Discharge: 2021-03-20 | Disposition: A | Discharge status: Home / Self Care | Payer: Medicare PPO | Source: Ambulatory Visit | Attending: Rheumatology | Admitting: Rheumatology

## 2021-03-20 ENCOUNTER — Other Ambulatory Visit: Payer: Self-pay

## 2021-03-20 DIAGNOSIS — Z79899 Other long term (current) drug therapy: Secondary | ICD-10-CM | POA: Diagnosis not present

## 2021-03-20 DIAGNOSIS — M0609 Rheumatoid arthritis without rheumatoid factor, multiple sites: Secondary | ICD-10-CM | POA: Insufficient documentation

## 2021-03-20 LAB — COMPREHENSIVE METABOLIC PANEL
ALT: 14 U/L (ref 0–44)
AST: 28 U/L (ref 15–41)
Albumin: 3.9 g/dL (ref 3.5–5.0)
Alkaline Phosphatase: 45 U/L (ref 38–126)
Anion gap: 8 (ref 5–15)
BUN: 16 mg/dL (ref 8–23)
CO2: 27 mmol/L (ref 22–32)
Calcium: 9.3 mg/dL (ref 8.9–10.3)
Chloride: 95 mmol/L — ABNORMAL LOW (ref 98–111)
Creatinine, Ser: 1.1 mg/dL — ABNORMAL HIGH (ref 0.44–1.00)
GFR, Estimated: 48 mL/min — ABNORMAL LOW (ref >60)
Glucose, Bld: 87 mg/dL (ref 70–99)
Potassium: 4.9 mmol/L (ref 3.5–5.1)
Sodium: 130 mmol/L — ABNORMAL LOW (ref 135–145)
Total Bilirubin: 0.6 mg/dL (ref 0.3–1.2)
Total Protein: 7 g/dL (ref 6.5–8.1)

## 2021-03-20 LAB — CBC WITH DIFFERENTIAL/PLATELET
Abs Immature Granulocytes: 0.01 10*3/uL (ref 0.00–0.07)
Basophils Absolute: 0 10*3/uL (ref 0.0–0.1)
Basophils Relative: 1 %
Eosinophils Absolute: 0 10*3/uL (ref 0.0–0.5)
Eosinophils Relative: 1 %
HCT: 35.7 % — ABNORMAL LOW (ref 36.0–46.0)
Hemoglobin: 11.3 g/dL — ABNORMAL LOW (ref 12.0–15.0)
Immature Granulocytes: 0 %
Lymphocytes Relative: 23 %
Lymphs Abs: 0.9 10*3/uL (ref 0.7–4.0)
MCH: 28.8 pg (ref 26.0–34.0)
MCHC: 31.7 g/dL (ref 30.0–36.0)
MCV: 90.8 fL (ref 80.0–100.0)
Monocytes Absolute: 0.5 10*3/uL (ref 0.1–1.0)
Monocytes Relative: 13 %
Neutro Abs: 2.4 10*3/uL (ref 1.7–7.7)
Neutrophils Relative %: 62 %
Platelets: 186 10*3/uL (ref 150–400)
RBC: 3.93 MIL/uL (ref 3.87–5.11)
RDW: 13.2 % (ref 11.5–15.5)
WBC: 3.9 10*3/uL — ABNORMAL LOW (ref 4.0–10.5)
nRBC: 0 % (ref 0.0–0.2)

## 2021-03-20 MED ORDER — DIPHENHYDRAMINE HCL 25 MG PO CAPS: 25.0000 mg | ORAL_CAPSULE | ORAL | Status: DC

## 2021-03-20 MED ORDER — ACETAMINOPHEN 325 MG PO TABS: 650.0000 mg | ORAL_TABLET | ORAL | Status: DC

## 2021-03-20 MED ORDER — SODIUM CHLORIDE 0.9 % IV SOLN
500.0000 mg | INTRAVENOUS | Status: DC
  Administered 2021-03-20: 500 mg via INTRAVENOUS
  Filled 2021-03-20: qty 20

## 2021-03-20 NOTE — Progress Notes (Signed)
Sodium and chloride are low.  Please notify the patient and forward results to the PCP.   Creatinine remains slightly elevated but continues to trend down.  GFR is low-48.  She should avoid the use of NSAIDs.  WBC count is borderline low-3.9.  Please advise the patient to have CBC with diff rechecked prior to the next infusion. Hgb and hct remain low but stable.

## 2021-03-21 ENCOUNTER — Ambulatory Visit: Payer: Medicare PPO | Admitting: Physician Assistant

## 2021-03-21 ENCOUNTER — Encounter: Payer: Self-pay | Admitting: Family Medicine

## 2021-03-21 DIAGNOSIS — E559 Vitamin D deficiency, unspecified: Secondary | ICD-10-CM

## 2021-03-21 DIAGNOSIS — Z79899 Other long term (current) drug therapy: Secondary | ICD-10-CM

## 2021-03-21 DIAGNOSIS — D638 Anemia in other chronic diseases classified elsewhere: Secondary | ICD-10-CM

## 2021-03-21 DIAGNOSIS — M810 Age-related osteoporosis without current pathological fracture: Secondary | ICD-10-CM

## 2021-03-21 DIAGNOSIS — C3411 Malignant neoplasm of upper lobe, right bronchus or lung: Secondary | ICD-10-CM

## 2021-03-21 DIAGNOSIS — R29890 Loss of height: Secondary | ICD-10-CM

## 2021-03-21 DIAGNOSIS — M4125 Other idiopathic scoliosis, thoracolumbar region: Secondary | ICD-10-CM

## 2021-03-21 DIAGNOSIS — M0609 Rheumatoid arthritis without rheumatoid factor, multiple sites: Secondary | ICD-10-CM

## 2021-03-21 DIAGNOSIS — E7800 Pure hypercholesterolemia, unspecified: Secondary | ICD-10-CM

## 2021-03-21 DIAGNOSIS — I7300 Raynaud's syndrome without gangrene: Secondary | ICD-10-CM

## 2021-03-21 DIAGNOSIS — Z96641 Presence of right artificial hip joint: Secondary | ICD-10-CM

## 2021-03-21 DIAGNOSIS — L659 Nonscarring hair loss, unspecified: Secondary | ICD-10-CM

## 2021-03-21 DIAGNOSIS — I10 Essential (primary) hypertension: Secondary | ICD-10-CM

## 2021-03-21 DIAGNOSIS — E270 Other adrenocortical overactivity: Secondary | ICD-10-CM

## 2021-03-21 DIAGNOSIS — M3500 Sicca syndrome, unspecified: Secondary | ICD-10-CM

## 2021-03-21 MED ORDER — CEPHALEXIN 500 MG PO CAPS: 500.0000 mg | ORAL_CAPSULE | Freq: Two times a day (BID) | ORAL | 0 refills | Status: DC

## 2021-03-22 NOTE — Progress Notes (Unsigned)
Office Visit Note  Patient: [name]             Date of Birth: [birth year]           MRN: [id number]             PCP: Darreld Mclean, MD Referring: Darreld Mclean, MD Visit Date: 04/05/2021 Occupation: _0 @  Subjective:  No chief complaint on file.   History of Present Illness: [name] is a 88 y.o. female ***   Activities of Daily Living:  Patient reports morning stiffness for *** {minute/hour:19697}.   Patient {ACTIONS;DENIES/REPORTS:21021675::"Denies"} nocturnal pain.  Difficulty dressing/grooming: {ACTIONS;DENIES/REPORTS:21021675::"Denies"} Difficulty climbing stairs: {ACTIONS;DENIES/REPORTS:21021675::"Denies"} Difficulty getting out of chair: {ACTIONS;DENIES/REPORTS:21021675::"Denies"} Difficulty using hands for taps, buttons, cutlery, and/or writing: {ACTIONS;DENIES/REPORTS:21021675::"Denies"}  No Rheumatology ROS completed.   PMFS History:  Patient Active Problem List   Diagnosis Date Noted   Rectal pain 10/10/2020   Acute posthemorrhagic anemia    Rectal bleeding 06/17/2019   Grade II internal hemorrhoids    Rectal ulcer    Popliteal artery occlusion, right ([place]) 04/10/2018   HTN (hypertension) 04/10/2018   Femoral artery pseudo-aneurysm, 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/09/2018   Anemia of chronic disease 02/24/2018   Unstable right hip arthroplasty 01/24/2018   History of revision of total replacement of right hip joint 01/24/2018   Hypovolemic shock ([place])    Hyperkalemia    Hyponatremia    Failed total hip arthroplasty ([place]) 11/27/2017   Status post revision of total hip 11/27/2017   Elevated cholesterol 10/11/2015   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[place]) 10/04/2014   Bilateral leg edema 08/01/2014   Elevated BP 08/01/2014   Rheumatoid arthritis involving multiple joints ([place]) 05/30/2014   Status post total replacement of right hip 04/28/2014   Long-term use of high-risk medication 11/11/2013   Symptomatic anemia 11/11/2013   Neuropathic pain  07/07/2013   Constipation due to pain medication 07/07/2013   Protein-calorie malnutrition, severe ([place]) 06/08/2013   Lung cancer, Right upper lobe 05/08/2013   Sciatica of right side 08/13/2011   Osteoporosis 03/07/2010   PARESTHESIA 03/07/2010   CT, CHEST, ABNORMAL 12/18/2008   ABNORMAL ECHOCARDIOGRAM 12/14/2008   SJOGREN'S SYNDROME 11/29/2008   HYPOGLYCEMIA 06/29/2006   RAYNAUD'S DISEASE 06/29/2006    Past Medical History:  Diagnosis Date   Arthralgia of multiple joints    followed by dr Gerilyn Nestle   Arthritis    Cardiomyopathy ([place])    Chronic constipation    Chronic inflammatory arthritis    rhemotolgist-  dr a. Gerilyn Nestle ([facility])   Dry eyes    eye drops used    GERD (gastroesophageal reflux disease)    H/O discoid lupus erythematosus    Hiatal hernia    History of colon polyps    Hypothyroidism    Iron deficiency anemia    LBBB (left bundle branch block) 2010   Mitchell's disease (erythromelalgia) [facility])    neurologist-  dr patel   Nocturia    Non-small cell cancer of right lung [facility]) surgeon-- dr gerhardt/  oncologist-  dr Julien Nordmann--- per lov notes no recurrence/   11-18-2017 per pt denies any symptoms   dx 2015--  Stage IIA (T2b,N0,M0) , +EGFR  mutation in exon 21, non-small cell adenocarcinoma right upper lobe---  s/p  Right upper lobectomy , right middley wedge resection and node dissection---  no chemo or radiation therapy   OA (osteoarthritis)    hands   Osteoporosis    PONV (postoperative nausea and vomiting)    likes phenergan  Raynaud's phenomenon 1965   Renal insufficiency    Rheumatoid arthritis ([place])    Sciatica    Scoliosis    Sjogren's syndrome ([place])     Family History  Problem Relation Age of Onset   Coronary artery disease Father    Colon cancer Father    Diabetes Father    Cancer Father        colon   Other Mother 40       MVA   Healthy Sister    Healthy Brother    Healthy Daughter    Hypothyroidism Daughter    Other Brother         killed in war   Pneumonia Sister    Healthy Daughter    Esophageal cancer Neg Hx    Kidney disease Neg Hx    Liver disease Neg Hx    Past Surgical History:  Procedure Laterality Date   ANTERIOR HIP REVISION Right 11/27/2017   Procedure: RIGHT HIP ACETABULAR REVISION;  Surgeon: [name], MD;  [facility];  Service: Orthopedics;  Laterality: Right;   ANTERIOR HIP REVISION Right 01/24/2018   Procedure: OPEN REDUCTION OF DISLOCATED ANTERIOR HIP WITH REVISION OF LINER AND HIP BALL;  Surgeon: [name], MD;  [facility];  Service: Orthopedics;  Laterality: Right;   APPENDECTOMY  1950s   BIOPSY  04/14/2018   Procedure: BIOPSY;  Surgeon: Yetta Flock, MD;  Location: [place];  Service: Gastroenterology;;   BIOPSY  04/16/2018   Procedure: BIOPSY;  Surgeon: Irving Copas., MD;  Location: [place];  Service: Gastroenterology;;   CARDIOVASCULAR STRESS TEST  12/2008    mild fixed basal to mid septal perfusion defect felt likely due to artifact from LBBB, no ischemia, EF 58%   COLONOSCOPY     COLONOSCOPY WITH PROPOFOL N/A 04/16/2018   Procedure: COLONOSCOPY WITH PROPOFOL;  Surgeon: Irving Copas., MD;  Location: [place];  Service: Gastroenterology;  Laterality: N/A;   ESOPHAGOGASTRODUODENOSCOPY (EGD) WITH PROPOFOL N/A 04/14/2018   Procedure: ESOPHAGOGASTRODUODENOSCOPY (EGD) WITH PROPOFOL;  Surgeon: Yetta Flock, MD;  Location: [place];  Service: Gastroenterology;  Laterality: N/A;   FEMORAL-POPLITEAL BYPASS GRAFT Right 04/10/2018   Procedure: REPAIR RIGHT FEMORAL ARTERY PSEUDOANEURYSM, RETROPERITONEAL EXPOSURE OF ILIAC ARTERY, RIGHT POPLITEAL EMBOLECTOMY;  Surgeon: Angelia Mould, MD;  Location: [place];  Service: Vascular;  Laterality: Right;   FLEXIBLE SIGMOIDOSCOPY N/A 06/17/2019   Procedure: FLEXIBLE SIGMOIDOSCOPY;  Surgeon: Lavena Bullion, DO;  [facility];  Service: Gastroenterology;   Laterality: N/A;   HEMOSTASIS CLIP PLACEMENT  06/17/2019   Procedure: HEMOSTASIS CLIP PLACEMENT;  Surgeon: Lavena Bullion, DO;  [facility];  Service: Gastroenterology;;   LYMPH NODE DISSECTION Right 06/07/2013   Procedure: LYMPH NODE DISSECTION;  Surgeon: [name], MD;  Location: [place];  Service: Thoracic;  Laterality: Right;   PATCH ANGIOPLASTY Right 04/10/2018   Procedure: PATCH  ANGIOPLASTY OF RIGHT FEMORAL ARTERY USING BOVINE PATCH, PATCH ANGIOPLASTY OF RIGHT POPLITEAL ARTERY USING BOVINE PATCH;  Surgeon: Angelia Mould, MD;  Location: [place];  Service: Vascular;  Laterality: Right;   SCHLEROTHERAPY  06/17/2019   Procedure: Woodward Ku OF VARICES;  Surgeon: Lavena Bullion, DO;  [facility];  Service: Gastroenterology;;   [place]   "large incision from chest to up to shoulder, the nerves were tied together, for raynaud's   THORACOTOMY  06/07/2013   Procedure: MINI/LIMITED THORACOTOMY; right middle lobe wedge resection;  Surgeon: Lilia Argue  Servando Snare, MD;  Location: [place];  Service: Thoracic;;   TONSILLECTOMY  child   TOTAL ABDOMINAL HYSTERECTOMY  1980's    W/ BSO   TOTAL HIP ARTHROPLASTY Right 04/28/2014   Procedure: RIGHT TOTAL HIP ARTHROPLASTY ANTERIOR APPROACH;  Surgeon: [name], MD;  [facility];  Service: Orthopedics;  Laterality: Right;   TRANSTHORACIC ECHOCARDIOGRAM  12/11/2008   ef 26-20%, grade 1 diastolic dysfunction/  mild LAE/  mild AR and MR/  trivial TR   VIDEO ASSISTED THORACOSCOPY (VATS)/WEDGE RESECTION Right 06/07/2013   Procedure: VIDEO ASSISTED THORACOSCOPY (VATS)/right upper lobectomy, On Q;  Surgeon: [name], MD;  Location: [place];  Service: Thoracic;  Laterality: Right;   VIDEO BRONCHOSCOPY N/A 06/07/2013   Procedure: VIDEO BRONCHOSCOPY;  Surgeon: [name], MD;  Location: [place];  Service: Thoracic;  Laterality: N/A;   VIDEO BRONCHOSCOPY WITH ENDOBRONCHIAL NAVIGATION N/A  05/04/2013   Procedure: VIDEO BRONCHOSCOPY WITH ENDOBRONCHIAL NAVIGATION;  Surgeon: [name], MD;  Location: [place];  Service: Thoracic;  Laterality: N/A;   Social History   Social History Narrative   Lives with husband, daughter and grandchild local.   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 History  Administered Date(s) Administered   Fluad Quad(high Dose 65+) 11/03/2018, 12/14/2019   Influenza Split 11/21/2011   Influenza Whole 11/29/2007, 11/29/2008, 11/29/2009   Influenza, High Dose Seasonal PF 10/30/2012, 01/02/2015, 11/13/2016, 11/12/2017   Influenza,inj,Quad PF,6+ Mos 11/22/2013, 11/14/2015   Influenza-Unspecified 11/13/2016, 11/12/2017, 12/14/2020   PFIZER(Purple Top)SARS-COV-2 Vaccination 04/12/2019, 05/03/2019, 01/20/2020, 12/14/2020   Pneumococcal Conjugate-13 05/22/2015   Pneumococcal Polysaccharide-23 06/13/2013   Tdap 08/17/2017   Zoster, Live 01/27/2014     Objective: Vital Signs: There were no vitals taken for this visit.   Physical Exam   Musculoskeletal Exam: ***  CDAI Exam: CDAI Score: -- Patient Global: --; Provider Global: -- Swollen: --; Tender: -- Joint Exam 04/05/2021   No joint exam has been documented for this visit   There is currently no information documented on the homunculus. Go to the Rheumatology activity and complete the homunculus joint exam.  Investigation: No additional findings.  Imaging: No results found.  Recent Labs: Lab Results  Component Value Date   WBC 3.9 (L) 03/20/2021   HGB 11.3 (L) 03/20/2021   PLT 186 03/20/2021   NA 130 (L) 03/20/2021   K 4.9 03/20/2021   CL 95 (L) 03/20/2021   CO2 27 03/20/2021   GLUCOSE 87 03/20/2021   BUN 16 03/20/2021   CREATININE 1.10 (H) 03/20/2021   BILITOT 0.6 03/20/2021   ALKPHOS 45 03/20/2021   AST 28 03/20/2021   ALT 14 03/20/2021   PROT 7.0 03/20/2021   ALBUMIN 3.9 03/20/2021   CALCIUM 9.3 03/20/2021   GFRAA 60  05/21/2020   QFTBGOLDPLUS Negative 07/04/2020    Speciality Comments: MTX, Arava-side effects Orencia-07/21 Prolia - 04/22/19, 11/09/19, 06/06/20   Fu q 3 months  Procedures:  No procedures performed Allergies: Amlodipine, Prochlorperazine edisylate, Aspirin, Cymbalta [duloxetine hcl], and Pamelor [nortriptyline hcl]   Assessment / Plan:     Visit Diagnoses: Rheumatoid arthritis of multiple sites with negative rheumatoid factor ([place])  High risk medication use  Sicca syndrome ([place])  Raynaud's disease without gangrene  Hair loss  Status post total replacement of right hip  Other idiopathic scoliosis, thoracolumbar region  Age-related osteoporosis without current pathological fracture  Vitamin D deficiency  Height loss  Malignant neoplasm of upper lobe of right lung (  [place])  Elevated cholesterol  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HCC)  Anemia of chronic disease  Essential hypertension  Orders: No orders of the defined types were placed in this encounter.  No orders of the defined types were placed in this encounter.   Face-to-face time spent with patient was *** minutes. Greater than 50% of time was spent in counseling and coordination of care.  Follow-Up Instructions: No follow-ups on file.   Ofilia Neas, PA-C  Note - This record has been created using Dragon software.  Chart creation errors have been sought, but may not always  have been located. Such creation errors do not reflect on  the standard of medical care.

## 2021-03-25 ENCOUNTER — Other Ambulatory Visit: Payer: Self-pay | Admitting: Family Medicine

## 2021-03-25 DIAGNOSIS — G8929 Other chronic pain: Secondary | ICD-10-CM

## 2021-03-27 ENCOUNTER — Ambulatory Visit (HOSPITAL_COMMUNITY)
Admission: RE | Admit: 2021-03-27 | Discharge: 2021-03-27 | Disposition: A | Discharge status: Home / Self Care | Payer: Medicare PPO | Source: Ambulatory Visit | Attending: Internal Medicine | Admitting: Internal Medicine

## 2021-03-27 ENCOUNTER — Other Ambulatory Visit: Payer: Self-pay

## 2021-03-27 ENCOUNTER — Inpatient Hospital Stay: Payer: Medicare PPO | Attending: Internal Medicine

## 2021-03-27 DIAGNOSIS — Z90722 Acquired absence of ovaries, bilateral: Secondary | ICD-10-CM | POA: Insufficient documentation

## 2021-03-27 DIAGNOSIS — I700 Atherosclerosis of aorta: Secondary | ICD-10-CM | POA: Insufficient documentation

## 2021-03-27 DIAGNOSIS — C3411 Malignant neoplasm of upper lobe, right bronchus or lung: Secondary | ICD-10-CM | POA: Insufficient documentation

## 2021-03-27 DIAGNOSIS — D649 Anemia, unspecified: Secondary | ICD-10-CM | POA: Insufficient documentation

## 2021-03-27 DIAGNOSIS — Z902 Acquired absence of lung [part of]: Secondary | ICD-10-CM | POA: Insufficient documentation

## 2021-03-27 DIAGNOSIS — C3490 Malignant neoplasm of unspecified part of unspecified bronchus or lung: Secondary | ICD-10-CM | POA: Insufficient documentation

## 2021-03-27 DIAGNOSIS — Z888 Allergy status to other drugs, medicaments and biological substances status: Secondary | ICD-10-CM | POA: Insufficient documentation

## 2021-03-27 DIAGNOSIS — Z886 Allergy status to analgesic agent status: Secondary | ICD-10-CM | POA: Insufficient documentation

## 2021-03-27 DIAGNOSIS — Z8719 Personal history of other diseases of the digestive system: Secondary | ICD-10-CM | POA: Insufficient documentation

## 2021-03-27 DIAGNOSIS — Z9049 Acquired absence of other specified parts of digestive tract: Secondary | ICD-10-CM | POA: Insufficient documentation

## 2021-03-27 DIAGNOSIS — R5383 Other fatigue: Secondary | ICD-10-CM | POA: Insufficient documentation

## 2021-03-27 DIAGNOSIS — R911 Solitary pulmonary nodule: Secondary | ICD-10-CM | POA: Diagnosis not present

## 2021-03-27 DIAGNOSIS — J439 Emphysema, unspecified: Secondary | ICD-10-CM | POA: Diagnosis not present

## 2021-03-27 DIAGNOSIS — R0609 Other forms of dyspnea: Secondary | ICD-10-CM | POA: Insufficient documentation

## 2021-03-27 DIAGNOSIS — J432 Centrilobular emphysema: Secondary | ICD-10-CM | POA: Insufficient documentation

## 2021-03-27 DIAGNOSIS — Z79899 Other long term (current) drug therapy: Secondary | ICD-10-CM | POA: Insufficient documentation

## 2021-03-27 DIAGNOSIS — R918 Other nonspecific abnormal finding of lung field: Secondary | ICD-10-CM | POA: Diagnosis not present

## 2021-03-27 LAB — CBC WITH DIFFERENTIAL (CANCER CENTER ONLY)
Abs Immature Granulocytes: 0.01 10*3/uL (ref 0.00–0.07)
Basophils Absolute: 0 10*3/uL (ref 0.0–0.1)
Basophils Relative: 1 %
Eosinophils Absolute: 0 10*3/uL (ref 0.0–0.5)
Eosinophils Relative: 1 %
HCT: 33.6 % — ABNORMAL LOW (ref 36.0–46.0)
Hemoglobin: 10.8 g/dL — ABNORMAL LOW (ref 12.0–15.0)
Immature Granulocytes: 0 %
Lymphocytes Relative: 16 %
Lymphs Abs: 0.8 10*3/uL (ref 0.7–4.0)
MCH: 28.6 pg (ref 26.0–34.0)
MCHC: 32.1 g/dL (ref 30.0–36.0)
MCV: 89.1 fL (ref 80.0–100.0)
Monocytes Absolute: 0.5 10*3/uL (ref 0.1–1.0)
Monocytes Relative: 9 %
Neutro Abs: 4 10*3/uL (ref 1.7–7.7)
Neutrophils Relative %: 73 %
Platelet Count: 194 10*3/uL (ref 150–400)
RBC: 3.77 MIL/uL — ABNORMAL LOW (ref 3.87–5.11)
RDW: 13.4 % (ref 11.5–15.5)
WBC Count: 5.4 10*3/uL (ref 4.0–10.5)
nRBC: 0 % (ref 0.0–0.2)

## 2021-03-27 LAB — CMP (CANCER CENTER ONLY)
ALT: 11 U/L (ref 0–44)
AST: 24 U/L (ref 15–41)
Albumin: 4.3 g/dL (ref 3.5–5.0)
Alkaline Phosphatase: 48 U/L (ref 38–126)
Anion gap: 5 (ref 5–15)
BUN: 26 mg/dL — ABNORMAL HIGH (ref 8–23)
CO2: 29 mmol/L (ref 22–32)
Calcium: 9.6 mg/dL (ref 8.9–10.3)
Chloride: 95 mmol/L — ABNORMAL LOW (ref 98–111)
Creatinine: 1.03 mg/dL — ABNORMAL HIGH (ref 0.44–1.00)
GFR, Estimated: 52 mL/min — ABNORMAL LOW (ref >60)
Glucose, Bld: 112 mg/dL — ABNORMAL HIGH (ref 70–99)
Potassium: 4.6 mmol/L (ref 3.5–5.1)
Sodium: 129 mmol/L — ABNORMAL LOW (ref 135–145)
Total Bilirubin: 0.3 mg/dL (ref 0.3–1.2)
Total Protein: 7.3 g/dL (ref 6.5–8.1)

## 2021-03-27 LAB — IRON AND IRON BINDING CAPACITY (CC-WL,HP ONLY)
Iron: 55 ug/dL (ref 28–170)
Saturation Ratios: 17 % (ref 10.4–31.8)
TIBC: 318 ug/dL (ref 250–450)
UIBC: 263 ug/dL (ref 148–442)

## 2021-03-27 LAB — FERRITIN: Ferritin: 543 ng/mL — ABNORMAL HIGH (ref 11–307)

## 2021-03-27 MED ORDER — IOHEXOL 300 MG/ML  SOLN
60.0000 mL | Freq: Once | INTRAMUSCULAR | Status: AC | PRN
  Administered 2021-03-27: 60 mL via INTRAVENOUS

## 2021-03-27 MED ORDER — SODIUM CHLORIDE (PF) 0.9 % IJ SOLN
INTRAMUSCULAR | Status: AC
  Filled 2021-03-27: qty 50

## 2021-03-28 NOTE — Progress Notes (Signed)
HPI: FU CHF. Nuclear study 2010 showed ejection fraction 58% with fixed septal defect related to left bundle branch block and no ischemia.  Echocardiogram January 2020 showed ejection fraction 40 to 28%, grade 1 diastolic dysfunction, mild aortic insufficiency and mild to moderate mitral regurgitation. Echocardiogram February 2020 showed normal LV systolic function grade 1 diastolic dysfunction, mild left atrial enlargement, mild aortic insufficiency.  Monitor April 2021 showed occasional PVC and brief episode of SVT.  Patient has been diagnosed with lung cancer.  CT February 2023 showed widespread pulmonary nodules increasing in size and emphysema.  Since last seen, she notes some orthopnea and bilateral lower extremity edema for 3 to 4 weeks.  She denies chest pain, palpitations or syncope.  No fever.  Current Outpatient Medications  Medication Sig Dispense Refill   Abatacept (ORENCIA IV) Inject into the vein every 28 (twenty-eight) days.     Acetaminophen (TYLENOL EXTRA STRENGTH PO) Take 1-2 tablets by mouth every 6 (six) hours as needed (pain).      Biotin 1000 MCG tablet Take 1,000 mcg by mouth daily.     Calcium Carbonate-Vitamin D 500-125 MG-UNIT TABS Take 1 tablet by mouth daily.     carvedilol (COREG) 3.125 MG tablet Take 1 tablet (3.125 mg total) by mouth 2 (two) times daily with a meal. KEEP OV. 60 tablet 1   denosumab (PROLIA) 60 MG/ML SOSY injection Inject 60 mg into the skin every 6 (six) months.     docusate sodium (COLACE) 100 MG capsule Take 100 mg by mouth 2 (two) times daily.     doxycycline (VIBRAMYCIN) 100 MG capsule Take 1 capsule (100 mg total) by mouth 2 (two) times daily. 20 capsule 0   gabapentin (NEURONTIN) 300 MG capsule TAKE 1 CAPSULE BY MOUTH IN THE MORNING, 1 IN THE AFTERNOON, AND 2 AT BEDTIME 360 capsule 1   Glucosamine-Chondroit-Vit C-Mn (GLUCOSAMINE CHONDR 1500 COMPLX PO) Take 1 capsule by mouth daily.     levothyroxine (SYNTHROID) 75 MCG tablet TAKE 1 TABLET  (75 MCG TOTAL) BY MOUTH DAILY BEFORE BREAKFAST. 90 tablet 1   methocarbamol (ROBAXIN) 500 MG tablet TAKE 1 TABLET BY MOUTH EVERY 6 HOURS AS NEEDED FOR MUSCLE SPASMS. 40 tablet 0   Multiple Vitamin (MULTIVITAMIN) tablet Take 1 tablet by mouth daily.     omeprazole (PRILOSEC) 20 MG capsule TAKE 1 CAPSULE BY MOUTH EVERY DAY 90 capsule 3   Probiotic Product (PROBIOTIC PO) Take 1 capsule by mouth daily.      traMADol (ULTRAM) 50 MG tablet Take 0.5-1 tablets (25-50 mg total) by mouth every 12 (twelve) hours as needed. Do not combine with other pain medication 30 tablet 3   VITAMIN D PO Take by mouth daily.     No current facility-administered medications for this visit.     Past Medical History:  Diagnosis Date   Arthralgia of multiple joints    followed by dr Gerilyn Nestle   Arthritis    Cardiomyopathy [facility])    Chronic constipation    Chronic inflammatory arthritis    rhemotolgist-  dr a. Gerilyn Nestle ([facility])   Dry eyes    eye drops used    GERD (gastroesophageal reflux disease)    H/O discoid lupus erythematosus    Hiatal hernia    History of colon polyps    Hypothyroidism    Iron deficiency anemia    LBBB (left bundle branch block) 2010   Mitchell's disease (erythromelalgia) [facility])    neurologist-  dr  patel   Nocturia    Non-small cell cancer of right lung [facility]) surgeon-- dr gerhardt/  oncologist-  dr Arbutus Ped--- per lov notes no recurrence/   11-18-2017 per pt denies any symptoms   dx 2015--  Stage IIA (T2b,N0,M0) , +EGFR  mutation in exon 21, non-small cell adenocarcinoma right upper lobe---  s/p  Right upper lobectomy , right middley wedge resection and node dissection---  no chemo or radiation therapy   OA (osteoarthritis)    hands   Osteoporosis    PONV (postoperative nausea and vomiting)    likes phenergan   Raynaud's phenomenon 1965   Renal insufficiency    Rheumatoid arthritis (HCC)    Sciatica    Scoliosis    Sjogren's syndrome [facility])     Past Surgical History:   Procedure Laterality Date   ANTERIOR HIP REVISION Right 11/27/2017   Procedure: RIGHT HIP ACETABULAR REVISION;  Surgeon: Kathryne Hitch, MD;  [facility];  Service: Orthopedics;  Laterality: Right;   ANTERIOR HIP REVISION Right 01/24/2018   Procedure: OPEN REDUCTION OF DISLOCATED ANTERIOR HIP WITH REVISION OF LINER AND HIP BALL;  Surgeon: Kathryne Hitch, MD;  [facility];  Service: Orthopedics;  Laterality: Right;   APPENDECTOMY  1950s   BIOPSY  04/14/2018   Procedure: BIOPSY;  Surgeon: Benancio Deeds, MD;  Location: [facility] END[facility];  Service: Gastroenterology;;   BIOPSY  04/16/2018   Procedure: BIOPSY;  Surgeon: Lemar Lofty., MD;  Location: [facility];  Service: Gastroenterology;;   CARDIOVASCULAR STRESS TEST  12/2008    mild fixed basal to mid septal perfusion defect felt likely due to artifact from LBBB, no ischemia, EF 58%   COLONOSCOPY     COLONOSCOPY WITH PROPOFOL N/A 04/16/2018   Procedure: COLONOSCOPY WITH PROPOFOL;  Surgeon: Lemar Lofty., MD;  Location: [facility]COPY;  Service: Gastroenterology;  Laterality: N/A;   ESOPHAGOGASTRODUODENOSCOPY (EGD) WITH PROPOFOL N/A 04/14/2018   Procedure: ESOPHAGOGASTRODUODENOSCOPY (EGD) WITH PROPOFOL;  Surgeon: Benancio Deeds, MD;  Location: [facility] ENDOSCOPY;  Service: Gastroenterology;  Laterality: N/A;   FEMORAL-POPLITEAL BYPASS GRAFT Right 04/10/2018   Procedure: REPAIR RIGHT FEMORAL ARTERY PSEUDOANEURYSM, RETROPERITONEAL EXPOSURE OF ILIAC ARTERY, RIGHT POPLITEAL EMBOLECTOMY;  Surgeon: Chuck Hint, MD;  Location: [facility] OR;  Service: Vascular;  Laterality: Right;   FLEXIBLE SIGMOIDOSCOPY N/A 06/17/2019   Procedure: FLEXIBLE SIGMOIDOSCOPY;  Surgeon: Shellia Cleverly, DO;  [facility];  Service: Gastroenterology;  Laterality: N/A;   HEMOSTASIS CLIP PLACEMENT  06/17/2019   Procedure: HEMOSTASIS CLIP PLACEMENT;  Surgeon: Shellia Cleverly, DO;  [facility];  Service:  Gastroenterology;;   LYMPH NODE DISSECTION Right 06/07/2013   Procedure: LYMPH NODE DISSECTION;  Surgeon: Delight Ovens, MD;  Location: [facility] OR;  Service: Thoracic;  Laterality: Right;   PATCH ANGIOPLASTY Right 04/10/2018   Procedure: PATCH  ANGIOPLASTY OF RIGHT FEMORAL ARTERY USING BOVINE PATCH, PATCH ANGIOPLASTY OF RIGHT POPLITEAL ARTERY USING BOVINE PATCH;  Surgeon: Chuck Hint, MD;  Location: [facility] OR;  Service: Vascular;  Laterality: Right;   SCHLEROTHERAPY  06/17/2019   Procedure: Theresia Majors OF VARICES;  Surgeon: Shellia Cleverly, DO;  [facility];  Service: Gastroenterology;;   THORACIC SYMPATHETECTOMY  1965   "large incision from chest to up to shoulder, the nerves were tied together, for raynaud's   THORACOTOMY  06/07/2013   Procedure: MINI/LIMITED THORACOTOMY; right middle lobe wedge resection;  Surgeon: Delight Ovens, MD;  Location: [facility] OR;  Service: Thoracic;;   TONSILLECTOMY  child  TOTAL ABDOMINAL HYSTERECTOMY  1980's    W/ BSO   TOTAL HIP ARTHROPLASTY Right 04/28/2014   Procedure: RIGHT TOTAL HIP ARTHROPLASTY ANTERIOR APPROACH;  Surgeon: [name], MD;  [facility];  Service: Orthopedics;  Laterality: Right;   TRANSTHORACIC ECHOCARDIOGRAM  12/11/2008   ef 16-94%, grade 1 diastolic dysfunction/  mild LAE/  mild AR and MR/  trivial TR   VIDEO ASSISTED THORACOSCOPY (VATS)/WEDGE RESECTION Right 06/07/2013   Procedure: VIDEO ASSISTED THORACOSCOPY (VATS)/right upper lobectomy, On Q;  Surgeon: [name], MD;  Location: [place];  Service: Thoracic;  Laterality: Right;   VIDEO BRONCHOSCOPY N/A 06/07/2013   Procedure: VIDEO BRONCHOSCOPY;  Surgeon: [name], MD;  Location: [place];  Service: Thoracic;  Laterality: N/A;   VIDEO BRONCHOSCOPY WITH ENDOBRONCHIAL NAVIGATION N/A 05/04/2013   Procedure: VIDEO BRONCHOSCOPY WITH ENDOBRONCHIAL NAVIGATION;  Surgeon: [name], MD;  Location: [place];  Service: Thoracic;  Laterality: N/A;     Social History   Socioeconomic History   Marital status: Widowed    Spouse name: Not on file   Number of children: 2   Years of education: Not on file   Highest education level: Not on file  Occupational History   Occupation: n/a  Tobacco Use   Smoking status: Never   Smokeless tobacco: Never  Vaping Use   Vaping Use: Never used  Substance and Sexual Activity   Alcohol use: Not Currently   Drug use: Never   Sexual activity: Not Currently    Birth control/protection: Surgical  Other Topics Concern   Not on file  Social History Narrative   Lives with husband, daughter and grandchild local.   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   Social Determinants of Health   Financial Resource Strain: Not on file  Food Insecurity: Not on file  Transportation Needs: Not on file  Physical Activity: Not on file  Stress: Not on file  Social Connections: Not on file  Intimate Partner Violence: Not on file    Family History  Problem Relation Age of Onset   Coronary artery disease Father    Colon cancer Father    Diabetes Father    Cancer Father        colon   Other Mother 17       MVA   Healthy Sister    Healthy Brother    Healthy Daughter    Hypothyroidism Daughter    Other Brother        killed in war   Pneumonia Sister    Healthy Daughter    Esophageal cancer Neg Hx    Kidney disease Neg Hx    Liver disease Neg Hx     ROS: no fevers or chills, productive cough, hemoptysis, dysphasia, odynophagia, melena, hematochezia, dysuria, hematuria, rash, seizure activity, claudication. Remaining systems are negative.  Physical Exam: Well-developed well-nourished in no acute distress.  Skin is warm and dry.  HEENT is normal.  Neck is supple.  Chest is clear to auscultation with normal expansion.  Cardiovascular exam is regular rate and rhythm.  Abdominal exam nontender or distended. No masses palpated. Extremities show 1+ bilateral edema;  mild erythema. neuro grossly intact  ECG-normal sinus rhythm at a rate of 93, left bundle branch block.  Personally reviewed  A/P  1 cardiomyopathy-LV function has improved on most recent echocardiogram.  We will continue beta-blocker.  2 acute diastolic congestive heart failure-patient has developed bilateral lower extremity edema and  symptoms orthopnea.  I will add Lasix 20 mg daily.  Check potassium and renal function in 1 week.  Schedule echocardiogram to reassess LV function.  3 palpitations-continue beta-blocker.  Symptoms are reasonly well controlled.  4 left bundle branch block  5 lung cancer-follow-up oncology.  Kirk Ruths, MD

## 2021-03-29 ENCOUNTER — Telehealth: Payer: Self-pay | Admitting: Family Medicine

## 2021-03-29 NOTE — Telephone Encounter (Signed)
Spoke with patient to schedule Medicare Annual Wellness Visit (AWV) either virtually or in office.  Patient had a lot going on and wanted a call in march   Last AWV ; please schedule at anytime with health coach

## 2021-03-29 NOTE — Progress Notes (Signed)
° °Office Visit Note ° °Patient: [name]             °Date of Birth: 04/10/1930           °MRN: [id number]             °PCP: Copland, Jessica C, MD °Referring: Copland, Jessica C, MD °Visit Date: 04/05/2021 °Occupation: @GUAROCC@ ° °Subjective:  °Pain and stiffness in both hands ° °History of Present Illness: [name] is a 88 y.o. female with history of seronegative rheumatoid arthritis and osteoporosis. She is on orencia IV infusions every 28 days.  Her last infusion was scheduled on 03/20/2021.  She has not missed any Orencia infusions recently.  She continues to experience chronic pain in both shoulders, both hands, and both feet.  She has noticed some progression in her osteoarthritis in her hands.  She denies any increased joint swelling but has had some increased fluid retention in her ankles.  She has an upcoming appointment with her cardiologist.  She continues to follow-up closely with Dr. Mohammed for management of lung cancer.  She had a recent chest CT on 03/27/2021 concerning for progressive metastatic disease. ° ° ° °Activities of Daily Living:  °Patient reports morning stiffness for all day. °Patient Denies nocturnal pain.  °Difficulty dressing/grooming: Reports °Difficulty climbing stairs: Denies °Difficulty getting out of chair: Denies °Difficulty using hands for taps, buttons, cutlery, and/or writing: Reports ° °Review of Systems  °Constitutional:  Negative for fatigue.  °HENT:  Negative for mouth sores, mouth dryness and nose dryness.   °Eyes:  Positive for dryness. Negative for pain and itching.  °Respiratory:  Negative for shortness of breath and difficulty breathing.   °Cardiovascular:  Negative for chest pain and palpitations.  °Gastrointestinal:  Negative for blood in stool, constipation and diarrhea.  °Endocrine: Negative for increased urination.  °Genitourinary:  Negative for difficulty urinating.  °Musculoskeletal:  Positive for joint pain, joint pain, joint swelling, myalgias,  morning stiffness, muscle tenderness and myalgias.  °Skin:  Negative for color change, rash and redness.  °Allergic/Immunologic: Positive for susceptible to infections.  °Neurological:  Negative for dizziness, numbness, headaches, memory loss and weakness.  °Hematological:  Positive for bruising/bleeding tendency.  °Psychiatric/Behavioral:  Negative for confusion.   ° °PMFS History:  °Patient Active Problem List  ° Diagnosis Date Noted  ° Rectal pain 10/10/2020  ° Acute posthemorrhagic anemia   ° Rectal bleeding 06/17/2019  ° Grade II internal hemorrhoids   ° Rectal ulcer   ° Popliteal artery occlusion, 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/10/2018  ° HTN (hypertension) 04/10/2018  ° Femoral artery pseudo-aneurysm, 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/09/2018  ° Anemia of chronic disease 02/24/2018  ° Unstable right hip arthroplasty 01/24/2018  ° History of revision of total replacement of right hip joint 01/24/2018  ° Hypovolemic shock (HCC)   ° Hyperkalemia   ° Hyponatremia   ° 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  ° Status post revision of total hip 11/27/2017  ° Elevated cholesterol 10/11/2015  ° 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HCC) 10/04/2014  ° Bilateral leg edema 08/01/2014  ° Elevated BP 08/01/2014  ° Rheumatoid arthritis involving multiple joints (HCC) 05/30/2014  ° Status post total replacement of right hip 04/28/2014  ° Long-term use of high-risk medication 11/11/2013  ° Symptomatic anemia 11/11/2013  ° Neuropathic pain 07/07/2013  ° Constipation due to pain medication 07/07/2013  ° Protein-calorie malnutrition, severe (HCC) 06/08/2013  ° Lung cancer, Right upper lobe 05/08/2013  ° Sciatica of right side 08/13/2011  ° Osteoporosis 03/07/2010  °   03/07/2010   PARESTHESIA 03/07/2010   CT, CHEST, ABNORMAL 12/18/2008   ABNORMAL ECHOCARDIOGRAM 12/14/2008   SJOGREN'S SYNDROME 11/29/2008   HYPOGLYCEMIA 06/29/2006   RAYNAUD'S DISEASE 06/29/2006    Past Medical History:  Diagnosis Date   Arthralgia of multiple joints    followed by dr Gerilyn Nestle    Arthritis    Cardiomyopathy ([place])    Chronic constipation    Chronic inflammatory arthritis    rhemotolgist-  dr a. Gerilyn Nestle ([facility])   Dry eyes    eye drops used    GERD (gastroesophageal reflux disease)    H/O discoid lupus erythematosus    Hiatal hernia    History of colon polyps    Hypothyroidism    Iron deficiency anemia    LBBB (left bundle branch block) 2010   Mitchell's disease (erythromelalgia) [facility])    neurologist-  dr patel   Nocturia    Non-small cell cancer of right lung [facility]) surgeon-- dr gerhardt/  oncologist-  dr Julien Nordmann--- per lov notes no recurrence/   11-18-2017 per pt denies any symptoms   dx 2015--  Stage IIA (T2b,N0,M0) , +EGFR  mutation in exon 21, non-small cell adenocarcinoma right upper lobe---  s/p  Right upper lobectomy , right middley wedge resection and node dissection---  no chemo or radiation therapy   OA (osteoarthritis)    hands   Osteoporosis    PONV (postoperative nausea and vomiting)    likes phenergan   Raynaud's phenomenon 1965   Renal insufficiency    Rheumatoid arthritis ([place])    Sciatica    Scoliosis    Sjogren's syndrome ([place])     Family History  Problem Relation Age of Onset   Coronary artery disease Father    Colon cancer Father    Diabetes Father    Cancer Father        colon   Other Mother 69       MVA   Healthy Sister    Healthy Brother    Healthy Daughter    Hypothyroidism Daughter    Other Brother        killed in war   Pneumonia Sister    Healthy Daughter    Esophageal cancer Neg Hx    Kidney disease Neg Hx    Liver disease Neg Hx    Past Surgical History:  Procedure Laterality Date   ANTERIOR HIP REVISION Right 11/27/2017   Procedure: RIGHT HIP ACETABULAR REVISION;  Surgeon: [name], MD;  Location: [facility];  Service: Orthopedics;  Laterality: Right;   ANTERIOR HIP REVISION Right 01/24/2018   Procedure: OPEN REDUCTION OF DISLOCATED ANTERIOR HIP WITH REVISION OF LINER AND HIP BALL;   Surgeon: [name], MD;  Location: [facility];  Service: Orthopedics;  Laterality: Right;   APPENDECTOMY  1950s   BIOPSY  04/14/2018   Procedure: BIOPSY;  Surgeon: Yetta Flock, MD;  Location: [place];  Service: Gastroenterology;;   BIOPSY  04/16/2018   Procedure: BIOPSY;  Surgeon: Irving Copas., MD;  Location: [place];  Service: Gastroenterology;;   CARDIOVASCULAR STRESS TEST  12/2008    mild fixed basal to mid septal perfusion defect felt likely due to artifact from LBBB, no ischemia, EF 58%   COLONOSCOPY     COLONOSCOPY WITH PROPOFOL N/A 04/16/2018   Procedure: COLONOSCOPY WITH PROPOFOL;  Surgeon: Irving Copas., MD;  Location: [place];  Service: Gastroenterology;  Laterality: N/A;   ESOPHAGOGASTRODUODENOSCOPY (EGD) WITH PROPOFOL N/A 04/14/2018   Procedure:  ESOPHAGOGASTRODUODENOSCOPY (EGD) WITH PROPOFOL;  Surgeon: Yetta Flock, MD;  Location: [place];  Service: Gastroenterology;  Laterality: N/A;   FEMORAL-POPLITEAL BYPASS GRAFT Right 04/10/2018   Procedure: REPAIR RIGHT FEMORAL ARTERY PSEUDOANEURYSM, RETROPERITONEAL EXPOSURE OF ILIAC ARTERY, RIGHT POPLITEAL EMBOLECTOMY;  Surgeon: Angelia Mould, MD;  Location: [place];  Service: Vascular;  Laterality: Right;   FLEXIBLE SIGMOIDOSCOPY N/A 06/17/2019   Procedure: FLEXIBLE SIGMOIDOSCOPY;  Surgeon: Lavena Bullion, DO;  [facility];  Service: Gastroenterology;  Laterality: N/A;   HEMOSTASIS CLIP PLACEMENT  06/17/2019   Procedure: HEMOSTASIS CLIP PLACEMENT;  Surgeon: Lavena Bullion, DO;  [facility];  Service: Gastroenterology;;   LYMPH NODE DISSECTION Right 06/07/2013   Procedure: LYMPH NODE DISSECTION;  Surgeon: [name], MD;  Location: [place];  Service: Thoracic;  Laterality: Right;   PATCH ANGIOPLASTY Right 04/10/2018   Procedure: PATCH  ANGIOPLASTY OF RIGHT FEMORAL ARTERY USING BOVINE PATCH, PATCH ANGIOPLASTY OF RIGHT POPLITEAL ARTERY USING  BOVINE PATCH;  Surgeon: Angelia Mould, MD;  Location: [place];  Service: Vascular;  Laterality: Right;   SCHLEROTHERAPY  06/17/2019   Procedure: [place] Ku OF VARICES;  Surgeon: Lavena Bullion, DO;  [facility];  Service: Gastroenterology;;   [place]   "large incision from chest to up to shoulder, the nerves were tied together, for raynaud's   THORACOTOMY  06/07/2013   Procedure: MINI/LIMITED THORACOTOMY; right middle lobe wedge resection;  Surgeon: [name], MD;  Location: [place];  Service: Thoracic;;   TONSILLECTOMY  child   TOTAL ABDOMINAL HYSTERECTOMY  1980's    W/ BSO   TOTAL HIP ARTHROPLASTY Right 04/28/2014   Procedure: RIGHT TOTAL HIP ARTHROPLASTY ANTERIOR APPROACH;  Surgeon: [name], MD;  [facility];  Service: Orthopedics;  Laterality: Right;   TRANSTHORACIC ECHOCARDIOGRAM  12/11/2008   ef 59-09%, grade 1 diastolic dysfunction/  mild LAE/  mild AR and MR/  trivial TR   VIDEO ASSISTED THORACOSCOPY (VATS)/WEDGE RESECTION Right 06/07/2013   Procedure: VIDEO ASSISTED THORACOSCOPY (VATS)/right upper lobectomy, On Q;  Surgeon: [name], MD;  Location: [place];  Service: Thoracic;  Laterality: Right;   VIDEO BRONCHOSCOPY N/A 06/07/2013   Procedure: VIDEO BRONCHOSCOPY;  Surgeon: [name], MD;  Location: [place];  Service: Thoracic;  Laterality: N/A;   VIDEO BRONCHOSCOPY WITH ENDOBRONCHIAL NAVIGATION N/A 05/04/2013   Procedure: VIDEO BRONCHOSCOPY WITH ENDOBRONCHIAL NAVIGATION;  Surgeon: [name], MD;  Location: [place];  Service: Thoracic;  Laterality: N/A;   Social History   Social History Narrative   Lives with husband, daughter and grandchild local.   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 History  Administered Date(s) Administered   Fluad Quad(high Dose 65+) 11/03/2018, 12/14/2019   Influenza Split 11/21/2011   Influenza Whole 11/29/2007,  11/29/2008, 11/29/2009   Influenza, High Dose Seasonal PF 10/30/2012, 01/02/2015, 11/13/2016, 11/12/2017   Influenza,inj,Quad PF,6+ Mos 11/22/2013, 11/14/2015   Influenza-Unspecified 11/13/2016, 11/12/2017, 12/14/2020   PFIZER(Purple Top)SARS-COV-2 Vaccination 04/12/2019, 05/03/2019, 01/20/2020, 12/14/2020   Pneumococcal Conjugate-13 05/22/2015   Pneumococcal Polysaccharide-23 06/13/2013   Tdap 08/17/2017   Zoster, Live 01/27/2014     Objective: Vital Signs: BP 138/73 (BP Location: Left Arm, Patient Position: Sitting, Cuff Size: Normal)    Pulse 76    Ht 5' (1.524 m)    Wt 109 lb 12.8 oz (49.8 kg)    BMI 21.44 kg/m    Physical Exam Vitals and nursing note reviewed.  Constitutional:      Appearance: She is well-developed.  HENT:     Head: Normocephalic and atraumatic.  Eyes:     Conjunctiva/sclera: Conjunctivae normal.  Cardiovascular:     Rate and Rhythm: Normal rate and regular rhythm.     Heart sounds: Normal heart sounds.  Pulmonary:     Effort: Pulmonary effort is normal.     Breath sounds: Normal breath sounds.  Abdominal:     General: Bowel sounds are normal.     Palpations: Abdomen is soft.  Musculoskeletal:     Cervical back: Normal range of motion.  Lymphadenopathy:     Cervical: No cervical adenopathy.  Skin:    General: Skin is warm and dry.     Capillary Refill: Capillary refill takes less than 2 seconds.  Neurological:     Mental Status: She is alert and oriented to person, place, and time.  Psychiatric:        Behavior: Behavior normal.     Musculoskeletal Exam: C-spine has good range of motion with no discomfort.  Thoracic kyphosis and scoliosis noted. Shoulder abduction limited to about 90 degrees bilaterally.  Elbow joints have good range of motion with no tenderness or inflammation.  Limited range of motion of both wrist joints.  No tenderness or synovitis of the wrist joints.  Synovial thickening over MCP joints with some tenderness and synovitis in the  right third PIP joint.  PIP and DIP thickening with subluxation of several DIP joints especially on the right hand.  Hip joints have good range of motion with no discomfort at this time.  Knee joints have good range of motion with no warmth or effusion.  Ankle joints have good range of motion with some tenderness over the right ankle.  Fluid retention was noted bilaterally.  No tenderness over MTP joints currently.  Overcrowding of toes noted.  CDAI Exam: CDAI Score: 2.4  Patient Global: 2 mm; Provider Global: 2 mm Swollen: 1 ; Tender: 1  Joint Exam 04/05/2021      Right  Left  MCP 3  Swollen Tender        Investigation: No additional findings.  Imaging: CT Chest W Contrast  Result Date: 03/28/2021 CLINICAL DATA:  88 year old female with history of non-small cell lung cancer. Staging examination. EXAM: CT CHEST WITH CONTRAST TECHNIQUE: Multidetector CT imaging of the chest was performed during intravenous contrast administration. RADIATION DOSE REDUCTION: This exam was performed according to the departmental dose-optimization program which includes automated exposure control, adjustment of the mA and/or kV according to patient size and/or use of iterative reconstruction technique. CONTRAST:  66m OMNIPAQUE IOHEXOL 300 MG/ML  SOLN COMPARISON:  Chest CT 10/15/2020. FINDINGS: Cardiovascular: Heart size is normal. There is no significant pericardial fluid, thickening or pericardial calcification. Aortic atherosclerosis. No definite coronary artery calcifications. Mediastinum/Nodes: No pathologically enlarged mediastinal or hilar lymph nodes. Large hiatal hernia. No axillary lymphadenopathy. Lungs/Pleura: Status post right upper lobectomy. Compensatory hyperexpansion of the right middle and lower lobes. Numerous pulmonary nodules appear similar in number compared to the prior examination, but several appear increased in size, suggesting progressive enlargement of numerous metastatic lesions in the  lungs. Specific examples include a 1.2 x 0.6 cm lesion in the superior segment of the right lower lobe (axial image 43 of series 5) which previously measured 1.1 x 0.5 cm, a 1.4 x 0.7 cm lesion in the posterior right lower lobe (axial image 53 of series 5) which previously measured only 9 x 5 mm, and  in the right lower lobe (axial image 74 of series 5) which previously measured only 6 x 6 mm. No definite new pulmonary nodules are confidently identified. No acute consolidative airspace disease. No pleural effusions. Mild diffuse bronchial wall thickening with very mild centrilobular and paraseptal emphysema. Upper Abdomen: Aortic atherosclerosis. Musculoskeletal: There are no aggressive appearing lytic or blastic lesions noted in the visualized portions of the skeleton. IMPRESSION: 1. Widespread pulmonary nodules appear similar in number, but many have clearly increased slightly in size compared to the prior study, concerning for progressive metastatic disease in the lungs. 2. Aortic atherosclerosis. 3. Mild diffuse bronchial wall thickening with very mild centrilobular and paraseptal emphysema; imaging findings suggestive of underlying COPD. 4. Large hiatal hernia. Aortic Atherosclerosis (ICD10-I70.0) and Emphysema (ICD10-J43.9). Electronically Signed   By: Daniel  Entrikin M.D.   On: 03/28/2021 06:08   ° °Recent Labs: °Lab Results  °Component Value Date  ° WBC 5.4 03/27/2021  ° HGB 10.8 (L) 03/27/2021  ° PLT 194 03/27/2021  ° NA 129 (L) 03/27/2021  ° K 4.6 03/27/2021  ° CL 95 (L) 03/27/2021  ° CO2 29 03/27/2021  ° GLUCOSE 112 (H) 03/27/2021  ° BUN 26 (H) 03/27/2021  ° CREATININE 1.03 (H) 03/27/2021  ° BILITOT 0.3 03/27/2021  ° ALKPHOS 48 03/27/2021  ° AST 24 03/27/2021  ° ALT 11 03/27/2021  ° PROT 7.3 03/27/2021  ° ALBUMIN 4.3 03/27/2021  ° CALCIUM 9.6 03/27/2021  ° GFRAA 60 05/21/2020  ° QFTBGOLDPLUS Negative 07/04/2020  ° ° °Speciality Comments: MTX, Arava-side effects °Orencia-07/21 °Prolia -  04/22/19, 11/09/19, 06/06/20 ° ° °Fu q 3 months ° °Procedures:  °No procedures performed °Allergies: Amlodipine, Prochlorperazine edisylate, Aspirin, Cymbalta [duloxetine hcl], and Pamelor [nortriptyline hcl]  ° °Assessment / Plan:     °Visit Diagnoses: Rheumatoid arthritis of multiple sites with negative rheumatoid factor (HCC) - RF negative, anti-CCP negative: She continues to experience chronic pain and stiffness in both shoulders, both hands, both feet.  She has limited abduction of both shoulders to about 90 degrees.  Synovial thickening of all MCP joints noted with some tenderness and synovitis of the right third MCP joint.  Incomplete fist formation due to thickening and subluxation of several PIP and DIP joints due to ongoing osteoarthritis was noted.  Overall her rheumatoid arthritis remains well controlled on IV Orencia infusions every 28 days.  Her last infusion was on 03/20/2021.  She continues to tolerate Orencia without any side effects and has not been experiencing recurrent infections.  She will remain on Orencia as monotherapy.  She was advised to notify us if she develops any increased joint pain or joint swelling.  She will follow-up in the office in 3 months. ° °High risk medication use - Orencia 500 mg IV now every 28 days (previously on Arava Methotrexate-intolerance). CBC and CMP updated on 03/27/20.  Results were reviewed with the patient today in the office.  She will continue to have lab work drawn with her infusions.  TB gold negative on 07/04/20.  Future orders placed today.  Plan: QuantiFERON-TB Gold Plus °Discussed the importance of holding Orencia if she develops signs or symptoms of infection and to resume once the infection is completely cleared. -  ° °Sicca syndrome (HCC): She has chronic dry eyes but her mouth dryness has improved. ° °Raynaud's disease without gangrene: Not currently active.  No digital ulcers or signs of gangrene.  ° °Hair loss: Improved since discontinuing arava.  She is  taking biotin daily. ° °Status post   total replacement of right hip - 01/2018 revision by Dr. Blakman.  Chronic pain.  ° °Other idiopathic scoliosis, thoracolumbar region: Chronic pain.   ° °Age-related osteoporosis without current pathological fracture - DEXA 01/28/2019: The BMD measured at Femur Neck is 0.636 g/cm2 with a T-score of -2.9.  Due to update DEXA-order will be placed today.  Her last prolia injection was administered on 12/20/2020. She continues to take a calcium and vitamin D supplement daily.  ° °Height loss: Thoracic kyphosis noted.   ° °Other medical conditions are listed as follows:  ° °Malignant neoplasm of upper lobe of right lung (HCC) - s/p lobectomy 2015 and she declined adjuvant systemic chemotherapy.  She is following up closely with Dr. Mohamed.  She had a chest CT on 03/27/2021 which revealed widespread pulmonary nodules concerning for progressive metastatic disease in lungs.  ° °Elevated cholesterol ° °Essential hypertension: BP was 138/73 today in the office.  ° °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HCC) ° °Anemia of chronic disease ° °Orders: °Orders Placed This Encounter  °Procedures  ° QuantiFERON-TB Gold Plus  ° °No orders of the defined types were placed in this encounter. ° ° °Follow-Up Instructions: Return in about 3 months (around 07/03/2021) for Rheumatoid arthritis, Osteoporosis. ° ° °Taylor M Dale, PA-C ° °Note - This record has been created using Dragon software.  °Chart creation errors have been sought, but may not always  °have been located. Such creation errors do not reflect on  °the standard of medical care.  °

## 2021-04-01 ENCOUNTER — Other Ambulatory Visit: Payer: Self-pay

## 2021-04-01 ENCOUNTER — Inpatient Hospital Stay: Payer: Medicare PPO | Admitting: Internal Medicine

## 2021-04-01 DIAGNOSIS — Z886 Allergy status to analgesic agent status: Secondary | ICD-10-CM | POA: Diagnosis not present

## 2021-04-01 DIAGNOSIS — D638 Anemia in other chronic diseases classified elsewhere: Secondary | ICD-10-CM

## 2021-04-01 DIAGNOSIS — J432 Centrilobular emphysema: Secondary | ICD-10-CM | POA: Diagnosis not present

## 2021-04-01 DIAGNOSIS — Z888 Allergy status to other drugs, medicaments and biological substances status: Secondary | ICD-10-CM | POA: Diagnosis not present

## 2021-04-01 DIAGNOSIS — C3490 Malignant neoplasm of unspecified part of unspecified bronchus or lung: Secondary | ICD-10-CM | POA: Diagnosis not present

## 2021-04-01 DIAGNOSIS — Z902 Acquired absence of lung [part of]: Secondary | ICD-10-CM | POA: Diagnosis not present

## 2021-04-01 DIAGNOSIS — Z9049 Acquired absence of other specified parts of digestive tract: Secondary | ICD-10-CM | POA: Diagnosis not present

## 2021-04-01 DIAGNOSIS — C3411 Malignant neoplasm of upper lobe, right bronchus or lung: Secondary | ICD-10-CM | POA: Diagnosis not present

## 2021-04-01 DIAGNOSIS — R5383 Other fatigue: Secondary | ICD-10-CM | POA: Diagnosis not present

## 2021-04-01 DIAGNOSIS — Z8719 Personal history of other diseases of the digestive system: Secondary | ICD-10-CM | POA: Diagnosis not present

## 2021-04-01 DIAGNOSIS — R0609 Other forms of dyspnea: Secondary | ICD-10-CM | POA: Diagnosis not present

## 2021-04-01 DIAGNOSIS — D649 Anemia, unspecified: Secondary | ICD-10-CM | POA: Diagnosis not present

## 2021-04-01 DIAGNOSIS — Z79899 Other long term (current) drug therapy: Secondary | ICD-10-CM | POA: Diagnosis not present

## 2021-04-01 DIAGNOSIS — Z90722 Acquired absence of ovaries, bilateral: Secondary | ICD-10-CM | POA: Diagnosis not present

## 2021-04-01 DIAGNOSIS — I700 Atherosclerosis of aorta: Secondary | ICD-10-CM | POA: Diagnosis not present

## 2021-04-01 NOTE — Progress Notes (Signed)
[place] Telephone:([phone]) [phone]   [phone]  OFFICE PROGRESS NOTE  Copland, Gay Filler, MD [address] [place] [address]  DIAGNOSIS: Stage IIA (T2b, N0, M0) non-small cell lung cancer, adenocarcinoma with positive EGFR mutation in exon 21 ([id number]) presented with right upper lobe lung mass diagnosed in March of 2015.  PRIOR THERAPY: Status post right upper lobectomy with wedge resection of the right middle lobe under the care of Dr. Servando Snare on 06/07/2013  CURRENT THERAPY: Observation.  INTERVAL HISTORY: [name] 88 y.o. female returns to the clinic today for follow-up visit accompanied by her daughter.  The patient is feeling fine today with no concerning complaints except for mild fatigue.  She is currently on Orencia for the arthritis and also on Prolia every 6 months.  She denied having any current chest pain but has occasional shortness of breath with mild cough and no hemoptysis.  She denied having any fever or chills.  She has no nausea, vomiting, diarrhea or constipation.  She has no headache or visual changes.  She is here today for evaluation with repeat CT scan of the chest for restaging of her disease.  MEDICAL HISTORY: Past Medical History:  Diagnosis Date   Arthralgia of multiple joints    followed by dr Gerilyn Nestle   Arthritis    Cardiomyopathy ([place])    Chronic constipation    Chronic inflammatory arthritis    rhemotolgist-  dr a. Gerilyn Nestle ([facility])   Dry eyes    eye drops used    GERD (gastroesophageal reflux disease)    H/O discoid lupus erythematosus    Hiatal hernia    History of colon polyps    Hypothyroidism    Iron deficiency anemia    LBBB (left bundle branch block) 2010   Mitchell's disease (erythromelalgia) [facility])    neurologist-  dr patel   Nocturia    Non-small cell cancer of right lung [facility]) surgeon-- dr gerhardt/  oncologist-  dr Julien Nordmann--- per lov notes no recurrence/   11-18-2017  per pt denies any symptoms   dx 2015--  Stage IIA (T2b,N0,M0) , +EGFR  mutation in exon 21, non-small cell adenocarcinoma right upper lobe---  s/p  Right upper lobectomy , right middley wedge resection and node dissection---  no chemo or radiation therapy   OA (osteoarthritis)    hands   Osteoporosis    PONV (postoperative nausea and vomiting)    likes phenergan   Raynaud's phenomenon 1965   Renal insufficiency    Rheumatoid arthritis ([place])    Sciatica    Scoliosis    Sjogren's syndrome ([place])     ALLERGIES:  is allergic to amlodipine, prochlorperazine edisylate, aspirin, cymbalta [duloxetine hcl], and pamelor [nortriptyline hcl].  MEDICATIONS:  Current Outpatient Medications  Medication Sig Dispense Refill   Abatacept (ORENCIA IV) Inject into the vein every 28 (twenty-eight) days.     Acetaminophen (TYLENOL EXTRA STRENGTH PO) Take 1-2 tablets by mouth every 6 (six) hours as needed (pain).      Biotin 1000 MCG tablet Take 1,000 mcg by mouth daily.     Calcium Carbonate-Vitamin D 500-125 MG-UNIT TABS Take 1 tablet by mouth daily.     carvedilol (COREG) 3.125 MG tablet Take 1 tablet (3.125 mg total) by mouth 2 (two) times daily with a meal. KEEP OV. 60 tablet 1   cephALEXin (KEFLEX) 500 MG capsule Take 1 capsule (500 mg total) by mouth 2 (two)  times daily. 10 capsule 0   denosumab (PROLIA) 60 MG/ML SOSY injection Inject 60 mg into the skin every 6 (six) months.     docusate sodium (COLACE) 100 MG capsule Take 100 mg by mouth 2 (two) times daily.     doxycycline (VIBRAMYCIN) 100 MG capsule Take 1 capsule (100 mg total) by mouth 2 (two) times daily. 20 capsule 0   gabapentin (NEURONTIN) 300 MG capsule TAKE 1 CAPSULE BY MOUTH IN THE MORNING, 1 IN THE AFTERNOON, AND 2 AT BEDTIME 360 capsule 1   Glucosamine-Chondroit-Vit C-Mn (GLUCOSAMINE CHONDR 1500 COMPLX PO) Take 1 capsule by mouth daily.     HYDROcodone-acetaminophen (NORCO/VICODIN) 5-325 MG tablet Take 1 tablet by mouth every 6 (six)  hours as needed for moderate pain. 30 tablet 0   levothyroxine (SYNTHROID) 75 MCG tablet TAKE 1 TABLET (75 MCG TOTAL) BY MOUTH DAILY BEFORE BREAKFAST. 90 tablet 1   methocarbamol (ROBAXIN) 500 MG tablet TAKE 1 TABLET BY MOUTH EVERY 6 HOURS AS NEEDED FOR MUSCLE SPASMS. 40 tablet 0   Multiple Vitamin (MULTIVITAMIN) tablet Take 1 tablet by mouth daily.     omeprazole (PRILOSEC) 20 MG capsule TAKE 1 CAPSULE BY MOUTH EVERY DAY 90 capsule 3   Probiotic Product (PROBIOTIC PO) Take 1 capsule by mouth daily.      traMADol (ULTRAM) 50 MG tablet Take 0.5-1 tablets (25-50 mg total) by mouth every 12 (twelve) hours as needed. Do not combine with other pain medication 30 tablet 3   VITAMIN D PO Take by mouth daily.     No current facility-administered medications for this visit.    SURGICAL HISTORY:  Past Surgical History:  Procedure Laterality Date   ANTERIOR HIP REVISION Right 11/27/2017   Procedure: RIGHT HIP ACETABULAR REVISION;  Surgeon: [name], MD;  Location: [facility];  Service: Orthopedics;  Laterality: Right;   ANTERIOR HIP REVISION Right 01/24/2018   Procedure: OPEN REDUCTION OF DISLOCATED ANTERIOR HIP WITH REVISION OF LINER AND HIP BALL;  Surgeon: [name], MD;  Location: [facility];  Service: Orthopedics;  Laterality: Right;   APPENDECTOMY  1950s   BIOPSY  04/14/2018   Procedure: BIOPSY;  Surgeon: Yetta Flock, MD;  Location: [facility];  Service: Gastroenterology;;   BIOPSY  04/16/2018   Procedure: BIOPSY;  Surgeon: Irving Copas., MD;  Location: [facility];  Service: Gastroenterology;;   CARDIOVASCULAR STRESS TEST  12/2008    mild fixed basal to mid septal perfusion defect felt likely due to artifact from LBBB, no ischemia, EF 58%   COLONOSCOPY     COLONOSCOPY WITH PROPOFOL N/A 04/16/2018   Procedure: COLONOSCOPY WITH PROPOFOL;  Surgeon: Irving Copas., MD;  Location: [facility];  Service: Gastroenterology;  Laterality: N/A;    ESOPHAGOGASTRODUODENOSCOPY (EGD) WITH PROPOFOL N/A 04/14/2018   Procedure: ESOPHAGOGASTRODUODENOSCOPY (EGD) WITH PROPOFOL;  Surgeon: Yetta Flock, MD;  Location: [place];  Service: Gastroenterology;  Laterality: N/A;   FEMORAL-POPLITEAL BYPASS GRAFT Right 04/10/2018   Procedure: REPAIR RIGHT FEMORAL ARTERY PSEUDOANEURYSM, RETROPERITONEAL EXPOSURE OF ILIAC ARTERY, RIGHT POPLITEAL EMBOLECTOMY;  Surgeon: Angelia Mould, MD;  Location: [facility];  Service: Vascular;  Laterality: Right;   FLEXIBLE SIGMOIDOSCOPY N/A 06/17/2019   Procedure: FLEXIBLE SIGMOIDOSCOPY;  Surgeon: Lavena Bullion, DO;  [facility];  Service: Gastroenterology;  Laterality: N/A;   HEMOSTASIS CLIP PLACEMENT  06/17/2019   Procedure: HEMOSTASIS CLIP PLACEMENT;  Surgeon: Lavena Bullion, DO;  [facility];  Service: Gastroenterology;;   LYMPH NODE DISSECTION Right 06/07/2013  Procedure: LYMPH NODE DISSECTION;  Surgeon: [name], MD;  Location: [facility];  Service: Thoracic;  Laterality: Right;   PATCH ANGIOPLASTY Right 04/10/2018   Procedure: PATCH  ANGIOPLASTY OF RIGHT FEMORAL ARTERY USING BOVINE PATCH, PATCH ANGIOPLASTY OF RIGHT POPLITEAL ARTERY USING BOVINE PATCH;  Surgeon: Angelia Mould, MD;  Location: [facility];  Service: Vascular;  Laterality: Right;   SCHLEROTHERAPY  06/17/2019   Procedure: [place] Ku OF VARICES;  Surgeon: Lavena Bullion, DO;  [facility];  Service: Gastroenterology;;   [place]   "large incision from chest to up to shoulder, the nerves were tied together, for raynaud's   THORACOTOMY  06/07/2013   Procedure: MINI/LIMITED THORACOTOMY; right middle lobe wedge resection;  Surgeon: [name], MD;  Location: [facility];  Service: Thoracic;;   TONSILLECTOMY  child   TOTAL ABDOMINAL HYSTERECTOMY  1980's    W/ BSO   TOTAL HIP ARTHROPLASTY Right 04/28/2014   Procedure: RIGHT TOTAL HIP ARTHROPLASTY ANTERIOR APPROACH;  Surgeon:  [name], MD;  [facility];  Service: Orthopedics;  Laterality: Right;   TRANSTHORACIC ECHOCARDIOGRAM  12/11/2008   ef 73-22%, grade 1 diastolic dysfunction/  mild LAE/  mild AR and MR/  trivial TR   VIDEO ASSISTED THORACOSCOPY (VATS)/WEDGE RESECTION Right 06/07/2013   Procedure: VIDEO ASSISTED THORACOSCOPY (VATS)/right upper lobectomy, On Q;  Surgeon: [name], MD;  Location: [facility];  Service: Thoracic;  Laterality: Right;   VIDEO BRONCHOSCOPY N/A 06/07/2013   Procedure: VIDEO BRONCHOSCOPY;  Surgeon: [name], MD;  Location: [facility];  Service: Thoracic;  Laterality: N/A;   VIDEO BRONCHOSCOPY WITH ENDOBRONCHIAL NAVIGATION N/A 05/04/2013   Procedure: VIDEO BRONCHOSCOPY WITH ENDOBRONCHIAL NAVIGATION;  Surgeon: [name], MD;  Location: [facility];  Service: Thoracic;  Laterality: N/A;    REVIEW OF SYSTEMS:  A comprehensive review of systems was negative except for: Constitutional: positive for fatigue Respiratory: positive for cough and dyspnea on exertion   PHYSICAL EXAMINATION: General appearance: alert, cooperative, and no distress Head: Normocephalic, without obvious abnormality, atraumatic Neck: no adenopathy, no JVD, supple, symmetrical, trachea midline, and thyroid not enlarged, symmetric, no tenderness/mass/nodules Lymph nodes: Cervical, supraclavicular, and axillary nodes normal. Resp: clear to auscultation bilaterally Back: symmetric, no curvature. ROM normal. No CVA tenderness. Cardio: regular rate and rhythm, S1, S2 normal, no murmur, click, rub or gallop GI: soft, non-tender; bowel sounds normal; no masses,  no organomegaly Extremities: extremities normal, atraumatic, no cyanosis or edema  ECOG PERFORMANCE STATUS: 1 - Symptomatic but completely ambulatory  There were no vitals taken for this visit.  LABORATORY DATA: Lab Results  Component Value Date   WBC 5.4 03/27/2021   HGB 10.8 (L) 03/27/2021   HCT 33.6 (L) 03/27/2021   MCV 89.1  03/27/2021   PLT 194 03/27/2021      Chemistry      Component Value Date/Time   NA 129 (L) 03/27/2021 1039   NA 130 (L) 08/21/2016 1125   K 4.6 03/27/2021 1039   K 4.8 08/21/2016 1125   CL 95 (L) 03/27/2021 1039   CO2 29 03/27/2021 1039   CO2 26 08/21/2016 1125   BUN 26 (H) 03/27/2021 1039   BUN 25.6 08/21/2016 1125   CREATININE 1.03 (H) 03/27/2021 1039   CREATININE 0.98 (H) 05/21/2020 1341   CREATININE 1.2 (H) 08/21/2016 1125      Component Value Date/Time   CALCIUM 9.6 03/27/2021 1039   CALCIUM 10.0 08/21/2016 1125   ALKPHOS 48 03/27/2021  1039   ALKPHOS 54 08/21/2016 1125   AST 24 03/27/2021 1039   AST 27 08/21/2016 1125   ALT 11 03/27/2021 1039   ALT 13 08/21/2016 1125   BILITOT 0.3 03/27/2021 1039   BILITOT 0.31 08/21/2016 1125       RADIOGRAPHIC STUDIES: CT Chest W Contrast  Result Date: 03/28/2021 CLINICAL DATA:  88 year old female with history of non-small cell lung cancer. Staging examination. EXAM: CT CHEST WITH CONTRAST TECHNIQUE: Multidetector CT imaging of the chest was performed during intravenous contrast administration. RADIATION DOSE REDUCTION: This exam was performed according to the departmental dose-optimization program which includes automated exposure control, adjustment of the mA and/or kV according to patient size and/or use of iterative reconstruction technique. CONTRAST:  48mL OMNIPAQUE IOHEXOL 300 MG/ML  SOLN COMPARISON:  Chest CT 10/15/2020. FINDINGS: Cardiovascular: Heart size is normal. There is no significant pericardial fluid, thickening or pericardial calcification. Aortic atherosclerosis. No definite coronary artery calcifications. Mediastinum/Nodes: No pathologically enlarged mediastinal or hilar lymph nodes. Large hiatal hernia. No axillary lymphadenopathy. Lungs/Pleura: Status post right upper lobectomy. Compensatory hyperexpansion of the right middle and lower lobes. Numerous pulmonary nodules appear similar in number compared to the prior  examination, but several appear increased in size, suggesting progressive enlargement of numerous metastatic lesions in the lungs. Specific examples include a 1.2 x 0.6 cm lesion in the superior segment of the right lower lobe (axial image 43 of series 5) which previously measured 1.1 x 0.5 cm, a 1.4 x 0.7 cm lesion in the posterior right lower lobe (axial image 53 of series 5) which previously measured only 9 x 5 mm, and a 8 x 8 mm lesion in the right lower lobe (axial image 74 of series 5) which previously measured only 6 x 6 mm. No definite new pulmonary nodules are confidently identified. No acute consolidative airspace disease. No pleural effusions. Mild diffuse bronchial wall thickening with very mild centrilobular and paraseptal emphysema. Upper Abdomen: Aortic atherosclerosis. Musculoskeletal: There are no aggressive appearing lytic or blastic lesions noted in the visualized portions of the skeleton. IMPRESSION: 1. Widespread pulmonary nodules appear similar in number, but many have clearly increased slightly in size compared to the prior study, concerning for progressive metastatic disease in the lungs. 2. Aortic atherosclerosis. 3. Mild diffuse bronchial wall thickening with very mild centrilobular and paraseptal emphysema; imaging findings suggestive of underlying COPD. 4. Large hiatal hernia. Aortic Atherosclerosis (ICD10-I70.0) and Emphysema (ICD10-J43.9). Electronically Signed   By: Vinnie Langton M.D.   On: 03/28/2021 06:08    ASSESSMENT AND PLAN:  This is a very pleasant 88 years old white female with stage IIA non-small cell lung cancer, adenocarcinoma with positive EGFR mutation in exon 21 status post right upper lobectomy as well as wedge resection of the right middle lobe in March 2015 and the patient declined adjuvant systemic chemotherapy. The patient has been on observation since that time and she is feeling fine. She had repeat CT scan of the chest performed recently.  I personally and  independently reviewed the scan images and discussed the results with the patient and her daughter. Her scan showed the persistent widespread pulmonary nodules similar number but some of them had increased slightly in size concerning for disease progression.  I discussed with the patient again the option of starting her on targeted therapy with Tagrisso since the patient has positive EGFR mutation in exon 21 versus continuous observation and monitoring.  The patient would like to continue on observation for now. I will see  her back for follow-up visit in 6 months for evaluation and repeat blood work and scan. For the anemia we will continue to monitor her hemoglobin and hematocrit closely and consider The patient for treatment with iron supplements if needed. She was advised to call immediately if she has any other concerning symptoms in the interval. The patient voices understanding of current disease status and treatment options and is in agreement with the current care plan. All questions were answered. The patient knows to call the clinic with any problems, questions or concerns. We can certainly see the patient much sooner if necessary.  Disclaimer: This note was dictated with voice recognition software. Similar sounding words can inadvertently be transcribed and may not be corrected upon review.

## 2021-04-05 ENCOUNTER — Encounter: Payer: Self-pay | Admitting: Physician Assistant

## 2021-04-05 ENCOUNTER — Other Ambulatory Visit: Payer: Self-pay

## 2021-04-05 ENCOUNTER — Ambulatory Visit: Payer: Medicare PPO | Admitting: Physician Assistant

## 2021-04-05 VITALS — BP 138/73 | HR 76 | Ht 60.0 in | Wt 109.8 lb

## 2021-04-05 DIAGNOSIS — D638 Anemia in other chronic diseases classified elsewhere: Secondary | ICD-10-CM

## 2021-04-05 DIAGNOSIS — M810 Age-related osteoporosis without current pathological fracture: Secondary | ICD-10-CM

## 2021-04-05 DIAGNOSIS — M3500 Sicca syndrome, unspecified: Secondary | ICD-10-CM

## 2021-04-05 DIAGNOSIS — M4125 Other idiopathic scoliosis, thoracolumbar region: Secondary | ICD-10-CM

## 2021-04-05 DIAGNOSIS — R29890 Loss of height: Secondary | ICD-10-CM

## 2021-04-05 DIAGNOSIS — M0609 Rheumatoid arthritis without rheumatoid factor, multiple sites: Secondary | ICD-10-CM | POA: Diagnosis not present

## 2021-04-05 DIAGNOSIS — E270 Other adrenocortical overactivity: Secondary | ICD-10-CM

## 2021-04-05 DIAGNOSIS — E559 Vitamin D deficiency, unspecified: Secondary | ICD-10-CM

## 2021-04-05 DIAGNOSIS — I10 Essential (primary) hypertension: Secondary | ICD-10-CM

## 2021-04-05 DIAGNOSIS — I7300 Raynaud's syndrome without gangrene: Secondary | ICD-10-CM

## 2021-04-05 DIAGNOSIS — E7800 Pure hypercholesterolemia, unspecified: Secondary | ICD-10-CM

## 2021-04-05 DIAGNOSIS — C3411 Malignant neoplasm of upper lobe, right bronchus or lung: Secondary | ICD-10-CM

## 2021-04-05 DIAGNOSIS — Z79899 Other long term (current) drug therapy: Secondary | ICD-10-CM

## 2021-04-05 DIAGNOSIS — Z96641 Presence of right artificial hip joint: Secondary | ICD-10-CM | POA: Diagnosis not present

## 2021-04-05 DIAGNOSIS — L659 Nonscarring hair loss, unspecified: Secondary | ICD-10-CM | POA: Diagnosis not present

## 2021-04-06 DIAGNOSIS — N390 Urinary tract infection, site not specified: Secondary | ICD-10-CM | POA: Diagnosis not present

## 2021-04-08 ENCOUNTER — Encounter: Payer: Self-pay | Admitting: Family Medicine

## 2021-04-10 ENCOUNTER — Ambulatory Visit: Payer: Medicare PPO | Admitting: Cardiology

## 2021-04-10 ENCOUNTER — Encounter (HOSPITAL_BASED_OUTPATIENT_CLINIC_OR_DEPARTMENT_OTHER): Payer: Self-pay

## 2021-04-10 ENCOUNTER — Ambulatory Visit (HOSPITAL_BASED_OUTPATIENT_CLINIC_OR_DEPARTMENT_OTHER)
Admission: RE | Admit: 2021-04-10 | Discharge: 2021-04-10 | Disposition: A | Discharge status: Home / Self Care | Payer: Medicare PPO | Source: Ambulatory Visit | Attending: Family Medicine | Admitting: Family Medicine

## 2021-04-10 ENCOUNTER — Encounter: Payer: Self-pay | Admitting: Cardiology

## 2021-04-10 ENCOUNTER — Other Ambulatory Visit: Payer: Self-pay

## 2021-04-10 VITALS — BP 142/80 | HR 93 | Ht 60.0 in | Wt 108.4 lb

## 2021-04-10 DIAGNOSIS — Z1231 Encounter for screening mammogram for malignant neoplasm of breast: Secondary | ICD-10-CM | POA: Insufficient documentation

## 2021-04-10 DIAGNOSIS — I5031 Acute diastolic (congestive) heart failure: Secondary | ICD-10-CM

## 2021-04-10 DIAGNOSIS — I420 Dilated cardiomyopathy: Secondary | ICD-10-CM

## 2021-04-10 DIAGNOSIS — R002 Palpitations: Secondary | ICD-10-CM

## 2021-04-10 MED ORDER — CARVEDILOL 3.125 MG PO TABS: 3.1250 mg | ORAL_TABLET | Freq: Two times a day (BID) | ORAL | 3 refills | Status: DC

## 2021-04-10 MED ORDER — FUROSEMIDE 20 MG PO TABS: 20.0000 mg | ORAL_TABLET | Freq: Every day | ORAL | 3 refills | Status: DC

## 2021-04-10 NOTE — Addendum Note (Signed)
Addended by: Cristopher Estimable on: 04/10/2021 11:14 AM   Modules accepted: Orders

## 2021-04-10 NOTE — Patient Instructions (Signed)
Medication Instructions:   START FUROSEMIDE 20 MG ONCE DAILY  *If you need a refill on your cardiac medications before your next appointment, please call your pharmacy*   Lab Work:  Your physician recommends that you return for lab work in: ONE WEEK-DO NOT NEED TO FAST  If you have labs (blood work) drawn today and your tests are completely normal, you will receive your results only by: [place] (if you have MyChart) OR A paper copy in the mail If you have any lab test that is abnormal or we need to change your treatment, we will call you to review the results.   Testing/Procedures:  Your physician has requested that you have an echocardiogram. Echocardiography is a painless test that uses sound waves to create images of your heart. It provides your doctor with information about the size and shape of your heart and how well your hearts chambers and valves are working. This procedure takes approximately one hour. There are no restrictions for this procedure. [facility]- 1ST FLOOR IMAGING DEPARTMENT   Follow-Up: At [facility], you and your health needs are our priority.  As part of our continuing mission to provide you with exceptional heart care, we have created designated Provider Care Teams.  These Care Teams include your primary Cardiologist (physician) and Advanced Practice Providers (APPs -  Physician Assistants and Nurse Practitioners) who all work together to provide you with the care you need, when you need it.  We recommend signing up for the patient portal called "MyChart".  Sign up information is provided on this After Visit Summary.  MyChart is used to connect with patients for Virtual Visits (Telemedicine).  Patients are able to view lab/test results, encounter notes, upcoming appointments, etc.  Non-urgent messages can be sent to your provider as well.   To learn more about what you can do with MyChart, go to [email or web address].    Your next  appointment:   8-12 week(s)  The format for your next appointment:   In Person  Provider:   Kirk Ruths, MD

## 2021-04-12 ENCOUNTER — Other Ambulatory Visit: Payer: Medicare PPO

## 2021-04-16 ENCOUNTER — Ambulatory Visit: Payer: Medicare PPO | Admitting: Internal Medicine

## 2021-04-17 ENCOUNTER — Other Ambulatory Visit: Payer: Self-pay

## 2021-04-17 ENCOUNTER — Encounter (HOSPITAL_COMMUNITY)
Admission: RE | Admit: 2021-04-17 | Discharge: 2021-04-17 | Disposition: A | Discharge status: Home / Self Care | Payer: Medicare PPO | Source: Ambulatory Visit | Attending: Rheumatology | Admitting: Rheumatology

## 2021-04-17 ENCOUNTER — Ambulatory Visit (HOSPITAL_BASED_OUTPATIENT_CLINIC_OR_DEPARTMENT_OTHER)
Admission: RE | Admit: 2021-04-17 | Discharge: 2021-04-17 | Disposition: A | Discharge status: Home / Self Care | Payer: Medicare PPO | Source: Ambulatory Visit | Attending: Cardiology | Admitting: Cardiology

## 2021-04-17 VITALS — BP 113/68 | HR 71 | Temp 97.3°F | Resp 18 | Wt 104.0 lb

## 2021-04-17 DIAGNOSIS — E875 Hyperkalemia: Secondary | ICD-10-CM

## 2021-04-17 DIAGNOSIS — M0609 Rheumatoid arthritis without rheumatoid factor, multiple sites: Secondary | ICD-10-CM

## 2021-04-17 DIAGNOSIS — I420 Dilated cardiomyopathy: Secondary | ICD-10-CM | POA: Insufficient documentation

## 2021-04-17 LAB — ECHOCARDIOGRAM COMPLETE
AR max vel: 1.63 cm2
AV Area VTI: 1.67 cm2
AV Area mean vel: 1.76 cm2
AV Mean grad: 4 mmHg
AV Peak grad: 7.5 mmHg
Ao pk vel: 1.37 m/s
Area-P 1/2: 7.37 cm2
Calc EF: 48.9 %
S' Lateral: 3.6 cm
Single Plane A2C EF: 46.8 %
Single Plane A4C EF: 42.5 %
Weight: 1664 oz

## 2021-04-17 LAB — BASIC METABOLIC PANEL
Anion gap: 10 (ref 5–15)
BUN: 26 mg/dL — ABNORMAL HIGH (ref 8–23)
CO2: 27 mmol/L (ref 22–32)
Calcium: 9.2 mg/dL (ref 8.9–10.3)
Chloride: 94 mmol/L — ABNORMAL LOW (ref 98–111)
Creatinine, Ser: 1.23 mg/dL — ABNORMAL HIGH (ref 0.44–1.00)
GFR, Estimated: 42 mL/min — ABNORMAL LOW (ref >60)
Glucose, Bld: 111 mg/dL — ABNORMAL HIGH (ref 70–99)
Potassium: 4.5 mmol/L (ref 3.5–5.1)
Sodium: 131 mmol/L — ABNORMAL LOW (ref 135–145)

## 2021-04-17 MED ORDER — SODIUM CHLORIDE 0.9 % IV SOLN
500.0000 mg | INTRAVENOUS | Status: DC
  Administered 2021-04-17: 500 mg via INTRAVENOUS
  Filled 2021-04-17: qty 20

## 2021-04-17 MED ORDER — ACETAMINOPHEN 325 MG PO TABS: 650.0000 mg | ORAL_TABLET | ORAL | Status: DC

## 2021-04-17 MED ORDER — DIPHENHYDRAMINE HCL 25 MG PO CAPS: 25.0000 mg | ORAL_CAPSULE | ORAL | Status: DC

## 2021-04-17 NOTE — Progress Notes (Signed)
?  Echocardiogram ?2D Echocardiogram has been performed. ? ?Elmer Ramp ?04/17/2021, 3:02 PM ?

## 2021-04-18 ENCOUNTER — Telehealth: Payer: Self-pay | Admitting: *Deleted

## 2021-04-18 DIAGNOSIS — I420 Dilated cardiomyopathy: Secondary | ICD-10-CM

## 2021-04-18 MED ORDER — LOSARTAN POTASSIUM 25 MG PO TABS: 25.0000 mg | ORAL_TABLET | Freq: Every day | ORAL | 3 refills | Status: DC

## 2021-04-18 NOTE — Telephone Encounter (Signed)
Spoke with pt, Aware of dr Jacalyn Lefevre recommendations.  ?New script sent to the pharmacy  ?Lab orders mailed to the pt  ?

## 2021-04-18 NOTE — Telephone Encounter (Signed)
-----   Message from Lelon Perla, MD sent at 04/18/2021  7:18 AM EST ----- ?LV function mildly reduced; add losartan 25 mg daily and follow BP; bmet one week ?Kirk Ruths ? ?

## 2021-04-26 DIAGNOSIS — I420 Dilated cardiomyopathy: Secondary | ICD-10-CM | POA: Diagnosis not present

## 2021-04-27 ENCOUNTER — Other Ambulatory Visit: Payer: Self-pay | Admitting: Orthopaedic Surgery

## 2021-04-27 ENCOUNTER — Other Ambulatory Visit: Payer: Self-pay | Admitting: Family Medicine

## 2021-04-27 LAB — BASIC METABOLIC PANEL
BUN/Creatinine Ratio: 27 (ref 12–28)
BUN: 27 mg/dL (ref 8–27)
CO2: 24 mmol/L (ref 20–29)
Calcium: 9.5 mg/dL (ref 8.7–10.3)
Chloride: 93 mmol/L — ABNORMAL LOW (ref 96–106)
Creatinine, Ser: 1 mg/dL (ref 0.57–1.00)
Glucose: 86 mg/dL (ref 70–99)
Potassium: 5 mmol/L (ref 3.5–5.2)
Sodium: 130 mmol/L — ABNORMAL LOW (ref 134–144)
eGFR: 54 mL/min/{1.73_m2} — ABNORMAL LOW (ref >59)

## 2021-04-27 LAB — SPECIMEN STATUS REPORT

## 2021-04-29 ENCOUNTER — Encounter: Payer: Self-pay | Admitting: Cardiology

## 2021-04-30 ENCOUNTER — Encounter: Payer: Self-pay | Admitting: *Deleted

## 2021-05-07 ENCOUNTER — Ambulatory Visit (INDEPENDENT_AMBULATORY_CARE_PROVIDER_SITE_OTHER): Payer: Medicare PPO

## 2021-05-07 ENCOUNTER — Other Ambulatory Visit: Payer: Self-pay

## 2021-05-07 DIAGNOSIS — Z0000 Encounter for general adult medical examination without abnormal findings: Secondary | ICD-10-CM | POA: Diagnosis not present

## 2021-05-07 NOTE — Patient Instructions (Signed)
[name] , ?Thank you for taking time to come for your Medicare Wellness Visit. I appreciate your ongoing commitment to your health goals. Please review the following plan we discussed and let me know if I can assist you in the future.  ? ?Screening recommendations/referrals: ?Colonoscopy: No longer required  ?Mammogram: Done 04/10/21 repeat every year  ?Bone Density: Done 01/28/19 ?Recommended yearly ophthalmology/optometry visit for glaucoma screening and checkup ?Recommended yearly dental visit for hygiene and checkup ? ?Vaccinations: ?Influenza vaccine: Done 12/14/20 repeat every year  ?Pneumococcal vaccine: Up to date ?Tdap vaccine: Done 08/17/17 repeat every 10 years  ?Shingles vaccine: Shingrix discussed. Please contact your pharmacy for coverage information.    ?Covid-19:Completed 2/23, 3/16, 01/20/20 & 12/14/20 ? ?Advanced directives: Please bring a copy of your health care power of attorney and living will to the office at your convenience. ? ?Conditions/risks identified: None at this time  ? ?Next appointment: Follow up in one year for your annual wellness visit  ? ? ?Preventive Care 71 Years and Older, Female ?Preventive care refers to lifestyle choices and visits with your health care provider that can promote health and wellness. ?What does preventive care include? ?A yearly physical exam. This is also called an annual well check. ?Dental exams once or twice a year. ?Routine eye exams. Ask your health care provider how often you should have your eyes checked. ?Personal lifestyle choices, including: ?Daily care of your teeth and gums. ?Regular physical activity. ?Eating a healthy diet. ?Avoiding tobacco and drug use. ?Limiting alcohol use. ?Practicing safe sex. ?Taking low-dose aspirin every day. ?Taking vitamin and mineral supplements as recommended by your health care provider. ?What happens during an annual well check? ?The services and screenings done by your health care provider during your annual  well check will depend on your age, overall health, lifestyle risk factors, and family history of disease. ?Counseling  ?Your health care provider may ask you questions about your: ?Alcohol use. ?Tobacco use. ?Drug use. ?Emotional well-being. ?Home and relationship well-being. ?Sexual activity. ?Eating habits. ?History of falls. ?Memory and ability to understand (cognition). ?Work and work Statistician. ?Reproductive health. ?Screening  ?You may have the following tests or measurements: ?Height, weight, and BMI. ?Blood pressure. ?Lipid and cholesterol levels. These may be checked every 5 years, or more frequently if you are over 67 years old. ?Skin check. ?Lung cancer screening. You may have this screening every year starting at age 33 if you have a 30-pack-year history of smoking and currently smoke or have quit within the past 15 years. ?Fecal occult blood test (FOBT) of the stool. You may have this test every year starting at age 32. ?Flexible sigmoidoscopy or colonoscopy. You may have a sigmoidoscopy every 5 years or a colonoscopy every 10 years starting at age 23. ?Hepatitis C blood test. ?Hepatitis B blood test. ?Sexually transmitted disease (STD) testing. ?Diabetes screening. This is done by checking your blood sugar (glucose) after you have not eaten for a while (fasting). You may have this done every 1-3 years. ?Bone density scan. This is done to screen for osteoporosis. You may have this done starting at age 86. ?Mammogram. This may be done every 1-2 years. Talk to your health care provider about how often you should have regular mammograms. ?Talk with your health care provider about your test results, treatment options, and if necessary, the need for more tests. ?Vaccines  ?Your health care provider may recommend certain vaccines, such as: ?Influenza vaccine. This is recommended every year. ?Tetanus, diphtheria,  and acellular pertussis (Tdap, Td) vaccine. You may need a Td booster every 10 years. ?Zoster  vaccine. You may need this after age 22. ?Pneumococcal 13-valent conjugate (PCV13) vaccine. One dose is recommended after age 90. ?Pneumococcal polysaccharide (PPSV23) vaccine. One dose is recommended after age 89. ?Talk to your health care provider about which screenings and vaccines you need and how often you need them. ?This information is not intended to replace advice given to you by your health care provider. Make sure you discuss any questions you have with your health care provider. ?Document Released: 03/02/2015 Document Revised: 10/24/2015 Document Reviewed: 12/05/2014 ?Elsevier Interactive Patient Education ? 2017 [place]. ? ?Fall Prevention in the Home ?Falls can cause injuries. They can happen to people of all ages. There are many things you can do to make your home safe and to help prevent falls. ?What can I do on the outside of my home? ?Regularly fix the edges of walkways and driveways and fix any cracks. ?Remove anything that might make you trip as you walk through a door, such as a raised step or threshold. ?Trim any bushes or trees on the path to your home. ?Use bright outdoor lighting. ?Clear any walking paths of anything that might make someone trip, such as rocks or tools. ?Regularly check to see if handrails are loose or broken. Make sure that both sides of any steps have handrails. ?Any raised decks and porches should have guardrails on the edges. ?Have any leaves, snow, or ice cleared regularly. ?Use sand or salt on walking paths during winter. ?Clean up any spills in your garage right away. This includes oil or grease spills. ?What can I do in the bathroom? ?Use night lights. ?Install grab bars by the toilet and in the tub and shower. Do not use towel bars as grab bars. ?Use non-skid mats or decals in the tub or shower. ?If you need to sit down in the shower, use a plastic, non-slip stool. ?Keep the floor dry. Clean up any water that spills on the floor as soon as it happens. ?Remove  soap buildup in the tub or shower regularly. ?Attach bath mats securely with double-sided non-slip rug tape. ?Do not have throw rugs and other things on the floor that can make you trip. ?What can I do in the bedroom? ?Use night lights. ?Make sure that you have a light by your bed that is easy to reach. ?Do not use any sheets or blankets that are too big for your bed. They should not hang down onto the floor. ?Have a firm chair that has side arms. You can use this for support while you get dressed. ?Do not have throw rugs and other things on the floor that can make you trip. ?What can I do in the kitchen? ?Clean up any spills right away. ?Avoid walking on wet floors. ?Keep items that you use a lot in easy-to-reach places. ?If you need to reach something above you, use a strong step stool that has a grab bar. ?Keep electrical cords out of the way. ?Do not use floor polish or wax that makes floors slippery. If you must use wax, use non-skid floor wax. ?Do not have throw rugs and other things on the floor that can make you trip. ?What can I do with my stairs? ?Do not leave any items on the stairs. ?Make sure that there are handrails on both sides of the stairs and use them. Fix handrails that are broken or loose. Make sure  that handrails are as long as the stairways. ?Check any carpeting to make sure that it is firmly attached to the stairs. Fix any carpet that is loose or worn. ?Avoid having throw rugs at the top or bottom of the stairs. If you do have throw rugs, attach them to the floor with carpet tape. ?Make sure that you have a light switch at the top of the stairs and the bottom of the stairs. If you do not have them, ask someone to add them for you. ?What else can I do to help prevent falls? ?Wear shoes that: ?Do not have high heels. ?Have rubber bottoms. ?Are comfortable and fit you well. ?Are closed at the toe. Do not wear sandals. ?If you use a stepladder: ?Make sure that it is fully opened. Do not climb a  closed stepladder. ?Make sure that both sides of the stepladder are locked into place. ?Ask someone to hold it for you, if possible. ?Clearly mark and make sure that you can see: ?Any grab bars or handra

## 2021-05-07 NOTE — Progress Notes (Signed)
Virtual Visit via Telephone Note ? ?I connected with  [name] on 05/07/21 at  1:00 PM EDT by telephone and verified that I am speaking with the correct person using two identifiers. ? ?Medicare Annual Wellness visit completed telephonically due to Covid-19 pandemic.  ? ?Persons participating in this call: This Health Coach and this patient.  ? ?Location: ?Patient: home ?Provider: office ?  ?I discussed the limitations, risks, security and privacy concerns of performing an evaluation and management service by telephone and the availability of in person appointments. The patient expressed understanding and agreed to proceed. ? ?Unable to perform video visit due to video visit attempted and failed and/or patient does not have video capability.  ? ?Some vital signs may be absent or patient reported.  ? ?Raven Brace, LPN ? ? ?Subjective:  ? [name] is a 88 y.o. female who presents for Medicare Annual (Subsequent) preventive examination. ? ?Review of Systems    ? ?Cardiac Risk Factors include: advanced age (>97men, >71 women);obesity (BMI >30kg/m2) ? ?   ?Objective:  ?  ?There were no vitals filed for this visit. ?There is no height or weight on file to calculate BMI. ? ?Advanced Directives 05/07/2021 06/17/2019 06/17/2019 03/15/2019 01/21/2019 04/10/2018 04/09/2018  ?Does Patient Have a Medical Advance Directive? Yes Yes Yes Yes Yes Yes Yes  ?Type of Paramedic of [place];Living will [place];Living will [place];Living will [place];Living will [place];Living will [place];Living will  ?Does patient want to make changes to medical advance directive? - No - Patient declined - No - Patient declined No - Patient declined No - Patient declined -  ?Copy of [place] in Chart? No - copy requested No - copy requested - Yes - validated most  recent copy scanned in chart (See row information) No - copy requested - -  ?Would patient like information on creating a medical advance directive? - - - - - - -  ?Pre-existing out of facility DNR order (yellow form or pink MOST form) - - - - - - -  ? ? ?Current Medications (verified) ?Outpatient Encounter Medications as of 05/07/2021  ?Medication Sig  ? Abatacept (ORENCIA IV) Inject into the vein every 28 (twenty-eight) days.  ? Acetaminophen (TYLENOL EXTRA STRENGTH PO) Take 1-2 tablets by mouth every 6 (six) hours as needed (pain).   ? Biotin 1000 MCG tablet Take 1,000 mcg by mouth daily.  ? Calcium Carbonate-Vitamin D 500-125 MG-UNIT TABS Take 1 tablet by mouth daily.  ? carvedilol (COREG) 3.125 MG tablet Take 1 tablet (3.125 mg total) by mouth 2 (two) times daily with a meal.  ? denosumab (PROLIA) 60 MG/ML SOSY injection Inject 60 mg into the skin every 6 (six) months.  ? docusate sodium (COLACE) 100 MG capsule Take 100 mg by mouth 2 (two) times daily.  ? furosemide (LASIX) 20 MG tablet Take 1 tablet (20 mg total) by mouth daily.  ? gabapentin (NEURONTIN) 300 MG capsule TAKE 1 CAPSULE BY MOUTH IN THE MORNING, 1 IN THE AFTERNOON, AND 2 AT BEDTIME  ? Glucosamine-Chondroit-Vit C-Mn (GLUCOSAMINE CHONDR 1500 COMPLX PO) Take 1 capsule by mouth daily.  ? levothyroxine (SYNTHROID) 75 MCG tablet Take 1 tablet (75 mcg total) by mouth daily before breakfast.  ? losartan (COZAAR) 25 MG tablet Take 1 tablet (25 mg total) by mouth daily.  ? methocarbamol (ROBAXIN) 500 MG tablet  TAKE 1 TABLET BY MOUTH EVERY 6 HOURS AS NEEDED FOR MUSCLE SPASMS.  ? Multiple Vitamin (MULTIVITAMIN) tablet Take 1 tablet by mouth daily.  ? omeprazole (PRILOSEC) 20 MG capsule TAKE 1 CAPSULE BY MOUTH EVERY DAY  ? Probiotic Product (PROBIOTIC PO) Take 1 capsule by mouth daily.   ? traMADol (ULTRAM) 50 MG tablet Take 0.5-1 tablets (25-50 mg total) by mouth every 12 (twelve) hours as needed. Do not combine with other pain medication  ? VITAMIN D PO Take  by mouth daily.  ? ?No facility-administered encounter medications on file as of 05/07/2021.  ? ? ?Allergies (verified) ?Amlodipine, Prochlorperazine edisylate, Aspirin, Cymbalta [duloxetine hcl], and Pamelor [nortriptyline hcl]  ? ?History: ?Past Medical History:  ?Diagnosis Date  ? Arthralgia of multiple joints   ? followed by dr Gerilyn Nestle  ? Arthritis   ? Cardiomyopathy ([place])   ? Chronic constipation   ? Chronic inflammatory arthritis   ? rhemotolgist-  dr a. Gerilyn Nestle [facility])  ? Dry eyes   ? eye drops used   ? GERD (gastroesophageal reflux disease)   ? H/O discoid lupus erythematosus   ? Hiatal hernia   ? History of colon polyps   ? Hypothyroidism   ? Iron deficiency anemia   ? LBBB (left bundle branch block) 2010  ? Mitchell's disease (erythromelalgia) ([place])   ? neurologist-  dr patel  ? Nocturia   ? Non-small cell cancer of right lung [facility]) surgeon-- dr gerhardt/  oncologist-  dr Julien Nordmann--- per lov notes no recurrence/   11-18-2017 per pt denies any symptoms  ? dx 2015--  Stage IIA (T2b,N0,M0) , +EGFR  mutation in exon 21, non-small cell adenocarcinoma right upper lobe---  s/p  Right upper lobectomy , right middley wedge resection and node dissection---  no chemo or radiation therapy  ? OA (osteoarthritis)   ? hands  ? Osteoporosis   ? PONV (postoperative nausea and vomiting)   ? likes phenergan  ? Raynaud's phenomenon 1965  ? Renal insufficiency   ? Rheumatoid arthritis ([place])   ? Sciatica   ? Scoliosis   ? Sjogren's syndrome ([place])   ? ?Past Surgical History:  ?Procedure Laterality Date  ? ANTERIOR HIP REVISION Right 11/27/2017  ? Procedure: RIGHT HIP ACETABULAR REVISION;  Surgeon: [name], MD;  Location: [facility];  Service: Orthopedics;  Laterality: Right;  ? ANTERIOR HIP REVISION Right 01/24/2018  ? Procedure: OPEN REDUCTION OF DISLOCATED ANTERIOR HIP WITH REVISION OF LINER AND HIP BALL;  Surgeon: [name], MD;  Location: [facility];  Service: Orthopedics;  Laterality:  Right;  ? APPENDECTOMY  1950s  ? BIOPSY  04/14/2018  ? Procedure: BIOPSY;  Surgeon: Yetta Flock, MD;  Location: [place];  Service: Gastroenterology;;  ? BIOPSY  04/16/2018  ? Procedure: BIOPSY;  Surgeon: Irving Copas., MD;  Location: [place];  Service: Gastroenterology;;  ? CARDIOVASCULAR STRESS TEST  12/2008  ?  mild fixed basal to mid septal perfusion defect felt likely due to artifact from LBBB, no ischemia, EF 58%  ? COLONOSCOPY    ? COLONOSCOPY WITH PROPOFOL N/A 04/16/2018  ? Procedure: COLONOSCOPY WITH PROPOFOL;  Surgeon: Mansouraty, Telford Nab., MD;  Location: [place];  Service: Gastroenterology;  Laterality: N/A;  ? ESOPHAGOGASTRODUODENOSCOPY (EGD) WITH PROPOFOL N/A 04/14/2018  ? Procedure: ESOPHAGOGASTRODUODENOSCOPY (EGD) WITH PROPOFOL;  Surgeon: Yetta Flock, MD;  Location: [place];  Service: Gastroenterology;  Laterality: N/A;  ? FEMORAL-POPLITEAL BYPASS GRAFT Right 04/10/2018  ? Procedure: REPAIR RIGHT  FEMORAL ARTERY PSEUDOANEURYSM, RETROPERITONEAL EXPOSURE OF ILIAC ARTERY, RIGHT POPLITEAL EMBOLECTOMY;  Surgeon: Angelia Mould, MD;  Location: [place];  Service: Vascular;  Laterality: Right;  ? FLEXIBLE SIGMOIDOSCOPY N/A 06/17/2019  ? Procedure: FLEXIBLE SIGMOIDOSCOPY;  Surgeon: Lavena Bullion, DO;  [facility];  Service: Gastroenterology;  Laterality: N/A;  ? HEMOSTASIS CLIP PLACEMENT  06/17/2019  ? Procedure: HEMOSTASIS CLIP PLACEMENT;  Surgeon: Lavena Bullion, DO;  [facility];  Service: Gastroenterology;;  ? LYMPH NODE DISSECTION Right 06/07/2013  ? Procedure: LYMPH NODE DISSECTION;  Surgeon: [name], MD;  Location: [place];  Service: Thoracic;  Laterality: Right;  ? PATCH ANGIOPLASTY Right 04/10/2018  ? Procedure: PATCH  ANGIOPLASTY OF RIGHT FEMORAL ARTERY USING BOVINE PATCH, PATCH ANGIOPLASTY OF RIGHT POPLITEAL ARTERY USING BOVINE PATCH;  Surgeon: Angelia Mould, MD;  Location: [place];  Service: Vascular;   Laterality: Right;  ? SCHLEROTHERAPY  06/17/2019  ? Procedure: SCHLEROTHERAPY OF VARICES;  Surgeon: Lavena Bullion, DO;  [facility];  Service: Gastroenterology;;  ? [place]

## 2021-05-15 ENCOUNTER — Other Ambulatory Visit: Payer: Self-pay

## 2021-05-15 ENCOUNTER — Encounter (HOSPITAL_COMMUNITY)
Admission: RE | Admit: 2021-05-15 | Discharge: 2021-05-15 | Disposition: A | Discharge status: Home / Self Care | Payer: Medicare PPO | Source: Ambulatory Visit | Attending: Rheumatology | Admitting: Rheumatology

## 2021-05-15 ENCOUNTER — Other Ambulatory Visit: Payer: Self-pay | Admitting: Pharmacist

## 2021-05-15 DIAGNOSIS — Z111 Encounter for screening for respiratory tuberculosis: Secondary | ICD-10-CM

## 2021-05-15 DIAGNOSIS — E875 Hyperkalemia: Secondary | ICD-10-CM | POA: Diagnosis not present

## 2021-05-15 DIAGNOSIS — M0609 Rheumatoid arthritis without rheumatoid factor, multiple sites: Secondary | ICD-10-CM

## 2021-05-15 DIAGNOSIS — Z79899 Other long term (current) drug therapy: Secondary | ICD-10-CM

## 2021-05-15 DIAGNOSIS — M069 Rheumatoid arthritis, unspecified: Secondary | ICD-10-CM

## 2021-05-15 MED ORDER — ACETAMINOPHEN 325 MG PO TABS: 650.0000 mg | ORAL_TABLET | ORAL | Status: DC

## 2021-05-15 MED ORDER — SODIUM CHLORIDE 0.9 % IV SOLN
500.0000 mg | INTRAVENOUS | Status: DC
  Administered 2021-05-15: 500 mg via INTRAVENOUS
  Filled 2021-05-15: qty 20

## 2021-05-15 MED ORDER — DIPHENHYDRAMINE HCL 25 MG PO CAPS: 25.0000 mg | ORAL_CAPSULE | ORAL | Status: DC

## 2021-05-15 NOTE — Progress Notes (Addendum)
Next infusion scheduled for Orencia IV on 06/12/21 and due for updated orders. ?Diagnosis: RA ? ?Dose: 500mg  every 28 days (appropriate based on last recorded weight of 46.7kg) ? ?Last Clinic Visit: 04/05/21 ?Next Clinic Visit: 06/26/21 ? ?Last infusion: 05/15/21 ? ?Labs: CBC and CMP on 03/27/21 ?TB Gold: negative on 07/04/20  ? ?Orders placed for Orencia IV x 3 doses along with premedication of acetaminophen and diphenhydramine to be administered 30 minutes before medication infusion. ? ?Standing CBC with diff/platelet and CMP with GFR orders placed to be drawn every 2 months.  Next TB gold due 07/04/21 (order placed to be drawn with May 2023 infusion) ? ?Knox Saliva, PharmD, MPH, BCPS ?Clinical Pharmacist (Rheumatology and Pulmonology) ? ?

## 2021-05-22 NOTE — Progress Notes (Deleted)
HPI:FU CHF. Nuclear study 2010 showed ejection fraction 58% with fixed septal defect related to left bundle branch block and no ischemia.  Monitor April 2021 showed occasional PVC and brief episode of SVT.  Patient has been diagnosed with lung cancer.  CT February 2023 showed widespread pulmonary nodules increasing in size and emphysema.  Follow-up echocardiogram March 2023 showed ejection fraction 40 to 25%, grade 1 diastolic dysfunction, mild mitral regurgitation, mild aortic insufficiency.  Since last seen,   Current Outpatient Medications  Medication Sig Dispense Refill   Abatacept (ORENCIA IV) Inject into the vein every 28 (twenty-eight) days.     Acetaminophen (TYLENOL EXTRA STRENGTH PO) Take 1-2 tablets by mouth every 6 (six) hours as needed (pain).      Biotin 1000 MCG tablet Take 1,000 mcg by mouth daily.     Calcium Carbonate-Vitamin D 500-125 MG-UNIT TABS Take 1 tablet by mouth daily.     carvedilol (COREG) 3.125 MG tablet Take 1 tablet (3.125 mg total) by mouth 2 (two) times daily with a meal. 180 tablet 3   denosumab (PROLIA) 60 MG/ML SOSY injection Inject 60 mg into the skin every 6 (six) months.     docusate sodium (COLACE) 100 MG capsule Take 100 mg by mouth 2 (two) times daily.     furosemide (LASIX) 20 MG tablet Take 1 tablet (20 mg total) by mouth daily. 90 tablet 3   gabapentin (NEURONTIN) 300 MG capsule TAKE 1 CAPSULE BY MOUTH IN THE MORNING, 1 IN THE AFTERNOON, AND 2 AT BEDTIME 360 capsule 1   Glucosamine-Chondroit-Vit C-Mn (GLUCOSAMINE CHONDR 1500 COMPLX PO) Take 1 capsule by mouth daily.     levothyroxine (SYNTHROID) 75 MCG tablet Take 1 tablet (75 mcg total) by mouth daily before breakfast. 90 tablet 0   losartan (COZAAR) 25 MG tablet Take 1 tablet (25 mg total) by mouth daily. 90 tablet 3   methocarbamol (ROBAXIN) 500 MG tablet TAKE 1 TABLET BY MOUTH EVERY 6 HOURS AS NEEDED FOR MUSCLE SPASMS. 40 tablet 0   Multiple Vitamin (MULTIVITAMIN) tablet Take 1 tablet by  mouth daily.     omeprazole (PRILOSEC) 20 MG capsule TAKE 1 CAPSULE BY MOUTH EVERY DAY 90 capsule 3   Probiotic Product (PROBIOTIC PO) Take 1 capsule by mouth daily.      traMADol (ULTRAM) 50 MG tablet Take 0.5-1 tablets (25-50 mg total) by mouth every 12 (twelve) hours as needed. Do not combine with other pain medication 30 tablet 3   VITAMIN D PO Take by mouth daily.     No current facility-administered medications for this visit.     Past Medical History:  Diagnosis Date   Arthralgia of multiple joints    followed by dr Gerilyn Nestle   Arthritis    Cardiomyopathy ([place])    Chronic constipation    Chronic inflammatory arthritis    rhemotolgist-  dr a. Gerilyn Nestle ([facility])   Dry eyes    eye drops used    GERD (gastroesophageal reflux disease)    H/O discoid lupus erythematosus    Hiatal hernia    History of colon polyps    Hypothyroidism    Iron deficiency anemia    LBBB (left bundle branch block) 2010   Mitchell's disease (erythromelalgia) [facility])    neurologist-  dr patel   Nocturia    Non-small cell cancer of right lung [facility]) surgeon-- dr gerhardt/  oncologist-  dr Julien Nordmann--- per lov notes no recurrence/   11-18-2017 per pt  denies any symptoms   dx 2015--  Stage IIA (T2b,N0,M0) , +EGFR  mutation in exon 21, non-small cell adenocarcinoma right upper lobe---  s/p  Right upper lobectomy , right middley wedge resection and node dissection---  no chemo or radiation therapy   OA (osteoarthritis)    hands   Osteoporosis    PONV (postoperative nausea and vomiting)    likes phenergan   Raynaud's phenomenon 1965   Renal insufficiency    Rheumatoid arthritis ([place])    Sciatica    Scoliosis    Sjogren's syndrome [facility])     Past Surgical History:  Procedure Laterality Date   ANTERIOR HIP REVISION Right 11/27/2017   Procedure: RIGHT HIP ACETABULAR REVISION;  Surgeon: [name], MD;  [facility];  Service: Orthopedics;  Laterality: Right;   ANTERIOR HIP  REVISION Right 01/24/2018   Procedure: OPEN REDUCTION OF DISLOCATED ANTERIOR HIP WITH REVISION OF LINER AND HIP BALL;  Surgeon: [name], MD;  [facility];  Service: Orthopedics;  Laterality: Right;   APPENDECTOMY  1950s   BIOPSY  04/14/2018   Procedure: BIOPSY;  Surgeon: Yetta Flock, MD;  Location: [place];  Service: Gastroenterology;;   BIOPSY  04/16/2018   Procedure: BIOPSY;  Surgeon: Irving Copas., MD;  Location: [place];  Service: Gastroenterology;;   CARDIOVASCULAR STRESS TEST  12/2008    mild fixed basal to mid septal perfusion defect felt likely due to artifact from LBBB, no ischemia, EF 58%   COLONOSCOPY     COLONOSCOPY WITH PROPOFOL N/A 04/16/2018   Procedure: COLONOSCOPY WITH PROPOFOL;  Surgeon: Irving Copas., MD;  Location: [place];  Service: Gastroenterology;  Laterality: N/A;   ESOPHAGOGASTRODUODENOSCOPY (EGD) WITH PROPOFOL N/A 04/14/2018   Procedure: ESOPHAGOGASTRODUODENOSCOPY (EGD) WITH PROPOFOL;  Surgeon: Yetta Flock, MD;  Location: [place];  Service: Gastroenterology;  Laterality: N/A;   FEMORAL-POPLITEAL BYPASS GRAFT Right 04/10/2018   Procedure: REPAIR RIGHT FEMORAL ARTERY PSEUDOANEURYSM, RETROPERITONEAL EXPOSURE OF ILIAC ARTERY, RIGHT POPLITEAL EMBOLECTOMY;  Surgeon: Angelia Mould, MD;  Location: [place];  Service: Vascular;  Laterality: Right;   FLEXIBLE SIGMOIDOSCOPY N/A 06/17/2019   Procedure: FLEXIBLE SIGMOIDOSCOPY;  Surgeon: Lavena Bullion, DO;  [facility];  Service: Gastroenterology;  Laterality: N/A;   HEMOSTASIS CLIP PLACEMENT  06/17/2019   Procedure: HEMOSTASIS CLIP PLACEMENT;  Surgeon: Lavena Bullion, DO;  [facility];  Service: Gastroenterology;;   LYMPH NODE DISSECTION Right 06/07/2013   Procedure: LYMPH NODE DISSECTION;  Surgeon: [name], MD;  Location: [place];  Service: Thoracic;  Laterality: Right;   PATCH ANGIOPLASTY Right 04/10/2018    Procedure: PATCH  ANGIOPLASTY OF RIGHT FEMORAL ARTERY USING BOVINE PATCH, PATCH ANGIOPLASTY OF RIGHT POPLITEAL ARTERY USING BOVINE PATCH;  Surgeon: Angelia Mould, MD;  Location: [place];  Service: Vascular;  Laterality: Right;   SCHLEROTHERAPY  06/17/2019   Procedure: [place] Ku OF VARICES;  Surgeon: Lavena Bullion, DO;  [facility];  Service: Gastroenterology;;   [place]   "large incision from chest to up to shoulder, the nerves were tied together, for raynaud's   THORACOTOMY  06/07/2013   Procedure: MINI/LIMITED THORACOTOMY; right middle lobe wedge resection;  Surgeon: [name], MD;  Location: [place];  Service: Thoracic;;   TONSILLECTOMY  child   TOTAL ABDOMINAL HYSTERECTOMY  1980's    W/ BSO   TOTAL HIP ARTHROPLASTY Right 04/28/2014   Procedure: RIGHT TOTAL HIP ARTHROPLASTY ANTERIOR APPROACH;  Surgeon: [name],  MD;  [facility];  Service: Orthopedics;  Laterality: Right;   TRANSTHORACIC ECHOCARDIOGRAM  12/11/2008   ef 45-50%, grade 1 diastolic dysfunction/  mild LAE/  mild AR and MR/  trivial TR   VIDEO ASSISTED THORACOSCOPY (VATS)/WEDGE RESECTION Right 06/07/2013   Procedure: VIDEO ASSISTED THORACOSCOPY (VATS)/right upper lobectomy, On Q;  Surgeon: Delight Ovens, MD;  [facility];  Service: Thoracic;  Laterality: Right;   VIDEO BRONCHOSCOPY N/A 06/07/2013   Procedure: VIDEO BRONCHOSCOPY;  Surgeon: Delight Ovens, MD;  Location: [facility] OR;  Service: Thoracic;  Laterality: N/A;   VIDEO BRONCHOSCOPY WITH ENDOBRONCHIAL NAVIGATION N/A 05/04/2013   Procedure: VIDEO BRONCHOSCOPY WITH ENDOBRONCHIAL NAVIGATION;  Surgeon: Delight Ovens, MD;  [facility];  Service: Thoracic;  Laterality: N/A;    Social History   Socioeconomic History   Marital status: Widowed    Spouse name: Not on file   Number of children: 2   Years of education: Not on file   Highest education level: Not on file  Occupational History    Occupation: n/a  Tobacco Use   Smoking status: Never   Smokeless tobacco: Never  Vaping Use   Vaping Use: Never used  Substance and Sexual Activity   Alcohol use: Not Currently   Drug use: Never   Sexual activity: Not Currently    Birth control/protection: Surgical  Other Topics Concern   Not on file  Social History Narrative   Lives with husband, daughter and grandchild local.   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   Social Determinants of Health   Financial Resource Strain: Low Risk    Difficulty of Paying Living Expenses: Not hard at all  Food Insecurity: No Food Insecurity   Worried About Programme researcher, broadcasting/film/video in the Last Year: Never true   Barista in the Last Year: Never true  Transportation Needs: No Transportation Needs   Lack of Transportation (Medical): No   Lack of Transportation (Non-Medical): No  Physical Activity: Insufficiently Active   Days of Exercise per Week: 3 days   Minutes of Exercise per Session: 30 min  Stress: No Stress Concern Present   Feeling of Stress : Not at all  Social Connections: Moderately Integrated   Frequency of Communication with Friends and Family: More than three times a week   Frequency of Social Gatherings with Friends and Family: More than three times a week   Attends Religious Services: More than 4 times per year   Active Member of [employer] or Organizations: Yes   Attends Banker Meetings: 1 to 4 times per year   Marital Status: Widowed  Catering manager Violence: Not At Risk   Fear of Current or Ex-Partner: No   Emotionally Abused: No   Physically Abused: No   Sexually Abused: No    Family History  Problem Relation Age of Onset   Coronary artery disease Father    Colon cancer Father    Diabetes Father    Cancer Father        colon   Other Mother 16       MVA   Healthy Sister    Healthy Brother    Healthy Daughter    Hypothyroidism Daughter    Other Brother         killed in war   Pneumonia Sister    Healthy Daughter    Esophageal cancer Neg Hx    Kidney disease Neg Hx  Liver disease Neg Hx     ROS: no fevers or chills, productive cough, hemoptysis, dysphasia, odynophagia, melena, hematochezia, dysuria, hematuria, rash, seizure activity, orthopnea, PND, pedal edema, claudication. Remaining systems are negative.  Physical Exam: Well-developed well-nourished in no acute distress.  Skin is warm and dry.  HEENT is normal.  Neck is supple.  Chest is clear to auscultation with normal expansion.  Cardiovascular exam is regular rate and rhythm.  Abdominal exam nontender or distended. No masses palpated. Extremities show no edema. neuro grossly intact  ECG- personally reviewed  A/P  1 cardiomyopathy-ejection fraction 40 to 45%.  We will continue beta-blocker at present dose.  Add losartan 50 mg daily.  Check potassium and renal function in 1 week.  2 acute diastolic congestive heart failure-continue diuretic.  Add Farxiga 10 mg daily.  3 palpitations-continue beta-blocker.  4 left bundle branch block  5 lung cancer-follow-up oncology.  Kirk Ruths, MD

## 2021-06-04 ENCOUNTER — Telehealth: Payer: Self-pay | Admitting: Rheumatology

## 2021-06-04 NOTE — Telephone Encounter (Signed)
Patient called the office stating she has an infusion next Wednesday and has been having nausea after she eats since she last got Prolia. Patient is unsure if its because of the Prolia. Patient requests a return call from [facility] on Thursday afternoon.  ?

## 2021-06-05 ENCOUNTER — Ambulatory Visit: Payer: Medicare PPO | Admitting: Cardiology

## 2021-06-07 NOTE — Telephone Encounter (Signed)
Returned call to patient - she was inquiring if Maureen Chatters can cause nausea. She states that her cancer has come back in four sites for which she has elected to watch and monitor. ? ?Per Orencia prescribing information, incidence rate of nausea is at least 10% so it may be causing.  Advised her that since she has been receiving Orencia infusions for a long time, it may not be the primary cause but may worsen any baseline nausea. She does not report nausea as being worse the few days after her infusion. Also advised that we could help manage nausea with premeds if infusion is causing nausea. ? ?Also reviewed that if she delays infusion, she may have RA flare. Advised her to discuss at upcoming office visit on 06/26/21 ? ?Knox Saliva, PharmD, MPH, BCPS ?Clinical Pharmacist (Rheumatology and Pulmonology) ?

## 2021-06-10 ENCOUNTER — Other Ambulatory Visit: Payer: Self-pay | Admitting: Pharmacist

## 2021-06-10 ENCOUNTER — Telehealth: Payer: Self-pay | Admitting: Pharmacist

## 2021-06-10 DIAGNOSIS — E559 Vitamin D deficiency, unspecified: Secondary | ICD-10-CM

## 2021-06-10 DIAGNOSIS — M810 Age-related osteoporosis without current pathological fracture: Secondary | ICD-10-CM

## 2021-06-10 DIAGNOSIS — Z79899 Other long term (current) drug therapy: Secondary | ICD-10-CM

## 2021-06-10 NOTE — Telephone Encounter (Signed)
Patient due for Prolia on 06/18/21. Her next Orencia infusion is scheduled for 06/12/21. Will have CBC, CMP, Vitamin D drawn with Orencia infusion in preparation to receive Prolia in May 2023 ? ?Knox Saliva, PharmD, MPH, BCPS ?Clinical Pharmacist (Rheumatology and Pulmonology) ?

## 2021-06-10 NOTE — Progress Notes (Signed)
Vitamin D lab order added to be drawn with upcoming Orencia infusion on 06/12/21 ? ?Knox Saliva, PharmD, MPH, BCPS ?Clinical Pharmacist (Rheumatology and Pulmonology) ?

## 2021-06-12 ENCOUNTER — Inpatient Hospital Stay (HOSPITAL_COMMUNITY): Admission: RE | Admit: 2021-06-12 | Payer: Medicare PPO | Source: Ambulatory Visit

## 2021-06-12 ENCOUNTER — Ambulatory Visit (HOSPITAL_COMMUNITY)
Admission: RE | Admit: 2021-06-12 | Discharge: 2021-06-12 | Disposition: A | Discharge status: Home / Self Care | Payer: Medicare PPO | Source: Ambulatory Visit | Attending: Rheumatology | Admitting: Rheumatology

## 2021-06-12 DIAGNOSIS — M810 Age-related osteoporosis without current pathological fracture: Secondary | ICD-10-CM

## 2021-06-12 DIAGNOSIS — Z79899 Other long term (current) drug therapy: Secondary | ICD-10-CM | POA: Diagnosis not present

## 2021-06-12 DIAGNOSIS — E559 Vitamin D deficiency, unspecified: Secondary | ICD-10-CM

## 2021-06-12 DIAGNOSIS — M0609 Rheumatoid arthritis without rheumatoid factor, multiple sites: Secondary | ICD-10-CM | POA: Diagnosis not present

## 2021-06-12 DIAGNOSIS — M069 Rheumatoid arthritis, unspecified: Secondary | ICD-10-CM | POA: Diagnosis not present

## 2021-06-12 LAB — COMPREHENSIVE METABOLIC PANEL
ALT: 13 U/L (ref 0–44)
AST: 24 U/L (ref 15–41)
Albumin: 3.7 g/dL (ref 3.5–5.0)
Alkaline Phosphatase: 38 U/L (ref 38–126)
Anion gap: 6 (ref 5–15)
BUN: 27 mg/dL — ABNORMAL HIGH (ref 8–23)
CO2: 27 mmol/L (ref 22–32)
Calcium: 9 mg/dL (ref 8.9–10.3)
Chloride: 99 mmol/L (ref 98–111)
Creatinine, Ser: 1.11 mg/dL — ABNORMAL HIGH (ref 0.44–1.00)
GFR, Estimated: 48 mL/min — ABNORMAL LOW (ref >60)
Glucose, Bld: 140 mg/dL — ABNORMAL HIGH (ref 70–99)
Potassium: 4.5 mmol/L (ref 3.5–5.1)
Sodium: 132 mmol/L — ABNORMAL LOW (ref 135–145)
Total Bilirubin: 0.6 mg/dL (ref 0.3–1.2)
Total Protein: 6.7 g/dL (ref 6.5–8.1)

## 2021-06-12 LAB — CBC WITH DIFFERENTIAL/PLATELET
Abs Immature Granulocytes: 0.02 10*3/uL (ref 0.00–0.07)
Basophils Absolute: 0 10*3/uL (ref 0.0–0.1)
Basophils Relative: 1 %
Eosinophils Absolute: 0 10*3/uL (ref 0.0–0.5)
Eosinophils Relative: 1 %
HCT: 32.8 % — ABNORMAL LOW (ref 36.0–46.0)
Hemoglobin: 10.7 g/dL — ABNORMAL LOW (ref 12.0–15.0)
Immature Granulocytes: 0 %
Lymphocytes Relative: 17 %
Lymphs Abs: 0.8 10*3/uL (ref 0.7–4.0)
MCH: 29.8 pg (ref 26.0–34.0)
MCHC: 32.6 g/dL (ref 30.0–36.0)
MCV: 91.4 fL (ref 80.0–100.0)
Monocytes Absolute: 0.4 10*3/uL (ref 0.1–1.0)
Monocytes Relative: 9 %
Neutro Abs: 3.2 10*3/uL (ref 1.7–7.7)
Neutrophils Relative %: 72 %
Platelets: 185 10*3/uL (ref 150–400)
RBC: 3.59 MIL/uL — ABNORMAL LOW (ref 3.87–5.11)
RDW: 14.2 % (ref 11.5–15.5)
WBC: 4.5 10*3/uL (ref 4.0–10.5)
nRBC: 0 % (ref 0.0–0.2)

## 2021-06-12 LAB — VITAMIN D 25 HYDROXY (VIT D DEFICIENCY, FRACTURES): Vit D, 25-Hydroxy: 60.77 ng/mL (ref 30–100)

## 2021-06-12 MED ORDER — DIPHENHYDRAMINE HCL 25 MG PO CAPS: 25.0000 mg | ORAL_CAPSULE | ORAL | Status: DC

## 2021-06-12 MED ORDER — ACETAMINOPHEN 325 MG PO TABS: 650.0000 mg | ORAL_TABLET | ORAL | Status: DC

## 2021-06-12 MED ORDER — ACETAMINOPHEN 325 MG PO TABS
ORAL_TABLET | ORAL | Status: AC
  Filled 2021-06-12: qty 2

## 2021-06-12 MED ORDER — SODIUM CHLORIDE 0.9 % IV SOLN
500.0000 mg | INTRAVENOUS | Status: DC
  Administered 2021-06-12: 500 mg via INTRAVENOUS
  Filled 2021-06-12: qty 20

## 2021-06-12 MED ORDER — DIPHENHYDRAMINE HCL 25 MG PO CAPS
ORAL_CAPSULE | ORAL | Status: AC
  Filled 2021-06-12: qty 1

## 2021-06-12 NOTE — Progress Notes (Signed)
CBC stable-anemia is stable.  ?Creatinine is slightly elevated-1.11 and GFR is low-48. Likely due to use of lasix.  Please advise the patient to avoid the use of NSAIDs. Glucose is 140. We will continue to monitor lab work closely with her infusions.

## 2021-06-12 NOTE — Progress Notes (Signed)
Vitamin D is WNL.

## 2021-06-12 NOTE — Progress Notes (Signed)
? ?Office Visit Note ? ?Patient: [name]             ?Date of Birth: 1930-04-11           ?MRN: [id number]             ?PCP: Darreld Mclean, MD ?Referring: Darreld Mclean, MD ?Visit Date: 06/26/2021 ?Occupation: @GUAROCC @ ? ?Subjective:  ?Increased pain in joints ? ?History of Present Illness: [name] is a 88 y.o. female with history of rheumatoid arthritis, osteoarthritis and osteoporosis.  She states recently she has been having increased pain in her joints.  She describes discomfort in her shoulders, elbows, hands and her hips.  She states she developed a lot of fluid in her feet which was uncomfortable.  She was seen by her her cardiologist and was given diuretics which helped with the lower extremity swelling.  She has been getting Prolia injections which she has been tolerating well.  Her last Prolia injection was in November 2022.  Her last Orencia infusion was on June 12, 2021.  She has been experiencing increased nausea. ? ?Activities of Daily Living:  ?Patient reports morning stiffness for all day. ?Patient Denies nocturnal pain.  ?Difficulty dressing/grooming: Denies ?Difficulty climbing stairs: Reports ?Difficulty getting out of chair: Denies ?Difficulty using hands for taps, buttons, cutlery, and/or writing: Reports ? ?Review of Systems  ?Constitutional:  Positive for fatigue.  ?HENT:  Positive for mouth dryness and nose dryness. Negative for mouth sores.   ?Eyes:  Positive for dryness. Negative for pain and itching.  ?Respiratory:  Negative for shortness of breath and difficulty breathing.   ?Cardiovascular:  Positive for palpitations. Negative for chest pain.  ?Gastrointestinal:  Positive for nausea. Negative for blood in stool, constipation and diarrhea.  ?Endocrine: Negative for increased urination.  ?Genitourinary:  Negative for difficulty urinating.  ?Musculoskeletal:  Positive for joint pain, joint pain, joint swelling, myalgias, morning stiffness, muscle tenderness and  myalgias.  ?Skin:  Positive for color change and redness. Negative for rash and sensitivity to sunlight.  ?Allergic/Immunologic: Positive for susceptible to infections.  ?Neurological:  Positive for headaches. Negative for dizziness, numbness and memory loss.  ?Hematological:  Positive for bruising/bleeding tendency.  ?Psychiatric/Behavioral:  Negative for confusion.   ? ?PMFS History:  ?Patient Active Problem List  ? Diagnosis Date Noted  ? Rectal pain 10/10/2020  ? Acute posthemorrhagic anemia   ? Rectal bleeding 06/17/2019  ? Grade II internal hemorrhoids   ? Rectal ulcer   ? 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 HTN (hypertension) 04/10/2018  ? Femoral artery pseudo-aneurysm, right ([place]) 04/09/2018  ? Anemia of chronic disease 02/24/2018  ? Unstable right hip arthroplasty 01/24/2018  ? History of revision of total replacement of right hip joint 01/24/2018  ? Hypovolemic shock ([place])   ? Hyperkalemia   ? Hyponatremia   ? 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 Status post revision of total hip 11/27/2017  ? Elevated cholesterol 10/11/2015  ? 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 Bilateral leg edema 08/01/2014  ? Elevated BP 08/01/2014  ? Rheumatoid arthritis involving multiple joints ([place]) 05/30/2014  ? Status post total replacement of right hip 04/28/2014  ? Long-term use of high-risk medication 11/11/2013  ? Symptomatic anemia 11/11/2013  ? Neuropathic pain 07/07/2013  ? Constipation due to pain medication 07/07/2013  ? Protein-calorie malnutrition, severe ([place]) 06/08/2013  ? Lung cancer, Right upper lobe 05/08/2013  ? Sciatica of right side 08/13/2011  ? Osteoporosis 03/07/2010  ?  PARESTHESIA 03/07/2010  ? CT, CHEST, ABNORMAL 12/18/2008  ? ABNORMAL ECHOCARDIOGRAM 12/14/2008  ? SJOGREN'S SYNDROME 11/29/2008  ? HYPOGLYCEMIA 06/29/2006  ? RAYNAUD'S DISEASE 06/29/2006  ?  ?Past Medical History:  ?Diagnosis Date  ? Arthralgia of multiple joints   ? followed by dr Gerilyn Nestle  ?  Arthritis   ? Cardiomyopathy ([place])   ? Chronic constipation   ? Chronic inflammatory arthritis   ? rhemotolgist-  dr a. Gerilyn Nestle [facility])  ? Dry eyes   ? eye drops used   ? GERD (gastroesophageal reflux disease)   ? H/O discoid lupus erythematosus   ? Hiatal hernia   ? History of colon polyps   ? Hypothyroidism   ? Iron deficiency anemia   ? LBBB (left bundle branch block) 2010  ? Mitchell's disease (erythromelalgia) ([place])   ? neurologist-  dr patel  ? Nocturia   ? Non-small cell cancer of right lung [facility]) surgeon-- dr gerhardt/  oncologist-  dr Julien Nordmann--- per lov notes no recurrence/   11-18-2017 per pt denies any symptoms  ? dx 2015--  Stage IIA (T2b,N0,M0) , +EGFR  mutation in exon 21, non-small cell adenocarcinoma right upper lobe---  s/p  Right upper lobectomy , right middley wedge resection and node dissection---  no chemo or radiation therapy  ? OA (osteoarthritis)   ? hands  ? Osteoporosis   ? PONV (postoperative nausea and vomiting)   ? likes phenergan  ? Raynaud's phenomenon 1965  ? Renal insufficiency   ? Rheumatoid arthritis ([place])   ? Sciatica   ? Scoliosis   ? Sjogren's syndrome ([place])   ?  ?Family History  ?Problem Relation Age of Onset  ? Coronary artery disease Father   ? Colon cancer Father   ? Diabetes Father   ? Cancer Father   ?     colon  ? Other Mother 64  ?     MVA  ? Healthy Sister   ? Healthy Brother   ? Healthy Daughter   ? Hypothyroidism Daughter   ? Other Brother   ?     killed in war  ? Pneumonia Sister   ? Healthy Daughter   ? Esophageal cancer Neg Hx   ? Kidney disease Neg Hx   ? Liver disease Neg Hx   ? ?Past Surgical History:  ?Procedure Laterality Date  ? ANTERIOR HIP REVISION Right 11/27/2017  ? Procedure: RIGHT HIP ACETABULAR REVISION;  Surgeon: [name], MD;  [facility];  Service: Orthopedics;  Laterality: Right;  ? ANTERIOR HIP REVISION Right 01/24/2018  ? Procedure: OPEN REDUCTION OF DISLOCATED ANTERIOR HIP WITH REVISION OF LINER AND HIP BALL;   Surgeon: [name], MD;  [facility];  Service: Orthopedics;  Laterality: Right;  ? APPENDECTOMY  1950s  ? BIOPSY  04/14/2018  ? Procedure: BIOPSY;  Surgeon: Yetta Flock, MD;  Location: [place];  Service: Gastroenterology;;  ? BIOPSY  04/16/2018  ? Procedure: BIOPSY;  Surgeon: Irving Copas., MD;  Location: [place];  Service: Gastroenterology;;  ? CARDIOVASCULAR STRESS TEST  12/2008  ?  mild fixed basal to mid septal perfusion defect felt likely due to artifact from LBBB, no ischemia, EF 58%  ? COLONOSCOPY    ? COLONOSCOPY WITH PROPOFOL N/A 04/16/2018  ? Procedure: COLONOSCOPY WITH PROPOFOL;  Surgeon: Mansouraty, Telford Nab., MD;  Location: [place];  Service: Gastroenterology;  Laterality: N/A;  ? ESOPHAGOGASTRODUODENOSCOPY (EGD) WITH PROPOFOL N/A 04/14/2018  ? Procedure: ESOPHAGOGASTRODUODENOSCOPY (EGD) WITH  PROPOFOL;  Surgeon: Yetta Flock, MD;  Location: [place];  Service: Gastroenterology;  Laterality: N/A;  ? FEMORAL-POPLITEAL BYPASS GRAFT Right 04/10/2018  ? Procedure: REPAIR RIGHT FEMORAL ARTERY PSEUDOANEURYSM, RETROPERITONEAL EXPOSURE OF ILIAC ARTERY, RIGHT POPLITEAL EMBOLECTOMY;  Surgeon: Angelia Mould, MD;  Location: [place];  Service: Vascular;  Laterality: Right;  ? FLEXIBLE SIGMOIDOSCOPY N/A 06/17/2019  ? Procedure: FLEXIBLE SIGMOIDOSCOPY;  Surgeon: Lavena Bullion, DO;  [facility];  Service: Gastroenterology;  Laterality: N/A;  ? HEMOSTASIS CLIP PLACEMENT  06/17/2019  ? Procedure: HEMOSTASIS CLIP PLACEMENT;  Surgeon: Lavena Bullion, DO;  [facility];  Service: Gastroenterology;;  ? LYMPH NODE DISSECTION Right 06/07/2013  ? Procedure: LYMPH NODE DISSECTION;  Surgeon: [name], MD;  Location: [place];  Service: Thoracic;  Laterality: Right;  ? PATCH ANGIOPLASTY Right 04/10/2018  ? Procedure: PATCH  ANGIOPLASTY OF RIGHT FEMORAL ARTERY USING BOVINE PATCH, PATCH ANGIOPLASTY OF RIGHT POPLITEAL ARTERY USING  BOVINE PATCH;  Surgeon: Angelia Mould, MD;  Location: [place];  Service: Vascular;  Laterality: Right;  ? SCHLEROTHERAPY  06/17/2019  ? Procedure: SCHLEROTHERAPY OF VARICES;  Surgeon: Lavena Bullion,

## 2021-06-13 ENCOUNTER — Other Ambulatory Visit: Payer: Self-pay | Admitting: Pharmacist

## 2021-06-13 DIAGNOSIS — M810 Age-related osteoporosis without current pathological fracture: Secondary | ICD-10-CM

## 2021-06-13 DIAGNOSIS — Z79899 Other long term (current) drug therapy: Secondary | ICD-10-CM

## 2021-06-13 NOTE — Progress Notes (Addendum)
Next dose of Prolia SQ not yet scheduled. She is due on or after 06/18/21. S ? ?Diagnosis: age-related osteporosis ? ?Dose: 60mg  SQ every 6 months ? ?Last Clinic Visit: 04/05/21 ?Next Clinic Visit: 06/26/21 ? ?Last dose: 12/20/20 ? ?Labs: CBC, CMP, Vitamin D on 06/12/21 - creatinine slightly elevated, calcium and vitamin D wnl ? ?Orders placed for Prolia SQ x 1 dose. She will receive on 07/10/21 during the same day that she receives Orencia infusion. Left VM with Medical Day to confirm that she can receive on the same day ? ?Patient states that she would like to receive on the same day if possible like last time. Did not have any issues or concerns last time. ? ?Will follow-up to ensured confirmed by Medical Day and completed  ? ?Knox Saliva, PharmD, MPH, BCPS ?Clinical Pharmacist (Rheumatology and Pulmonology) ?

## 2021-06-13 NOTE — Telephone Encounter (Signed)
Labs from 06/12/21 wnl to proceed with Prolia which is due or after 06/18/21. ? ?Knox Saliva, PharmD, MPH, BCPS ?Clinical Pharmacist (Rheumatology and Pulmonology) ?

## 2021-06-26 ENCOUNTER — Encounter: Payer: Self-pay | Admitting: Rheumatology

## 2021-06-26 ENCOUNTER — Ambulatory Visit: Payer: Medicare PPO | Admitting: Rheumatology

## 2021-06-26 VITALS — BP 151/78 | HR 76 | Ht 60.0 in | Wt 109.2 lb

## 2021-06-26 DIAGNOSIS — Z79899 Other long term (current) drug therapy: Secondary | ICD-10-CM

## 2021-06-26 DIAGNOSIS — L659 Nonscarring hair loss, unspecified: Secondary | ICD-10-CM

## 2021-06-26 DIAGNOSIS — C3411 Malignant neoplasm of upper lobe, right bronchus or lung: Secondary | ICD-10-CM

## 2021-06-26 DIAGNOSIS — Z96641 Presence of right artificial hip joint: Secondary | ICD-10-CM

## 2021-06-26 DIAGNOSIS — E270 Other adrenocortical overactivity: Secondary | ICD-10-CM

## 2021-06-26 DIAGNOSIS — M810 Age-related osteoporosis without current pathological fracture: Secondary | ICD-10-CM | POA: Diagnosis not present

## 2021-06-26 DIAGNOSIS — E7800 Pure hypercholesterolemia, unspecified: Secondary | ICD-10-CM

## 2021-06-26 DIAGNOSIS — M3500 Sicca syndrome, unspecified: Secondary | ICD-10-CM | POA: Diagnosis not present

## 2021-06-26 DIAGNOSIS — M4125 Other idiopathic scoliosis, thoracolumbar region: Secondary | ICD-10-CM

## 2021-06-26 DIAGNOSIS — M0609 Rheumatoid arthritis without rheumatoid factor, multiple sites: Secondary | ICD-10-CM

## 2021-06-26 DIAGNOSIS — I10 Essential (primary) hypertension: Secondary | ICD-10-CM

## 2021-06-26 DIAGNOSIS — I7300 Raynaud's syndrome without gangrene: Secondary | ICD-10-CM | POA: Diagnosis not present

## 2021-06-26 DIAGNOSIS — R29890 Loss of height: Secondary | ICD-10-CM

## 2021-06-26 DIAGNOSIS — D638 Anemia in other chronic diseases classified elsewhere: Secondary | ICD-10-CM

## 2021-06-26 NOTE — Patient Instructions (Signed)
Vaccines °You are taking a medication(s) that can suppress your immune system.  The following immunizations are recommended: °Flu annually °Covid-19  °Td/Tdap (tetanus, diphtheria, pertussis) every 10 years °Pneumonia (Prevnar 15 then Pneumovax 23 at least 1 year apart.  Alternatively, can take Prevnar 20 without needing additional dose) °Shingrix: 2 doses from 4 weeks to 6 months apart ° °Please check with your PCP to make sure you are up to date.  ° °If you have signs or symptoms of an infection or start antibiotics: °First, call your PCP for workup of your infection. °Hold your medication through the infection, until you complete your antibiotics, and until symptoms resolve if you take the following: °Injectable medication (Actemra, Benlysta, Cimzia, Cosentyx, Enbrel, Humira, Kevzara, Orencia, Remicade, Simponi, Stelara, Taltz, Tremfya) °Methotrexate °Leflunomide (Arava) °Mycophenolate (Cellcept) °Xeljanz, Olumiant, or Rinvoq  °

## 2021-06-28 ENCOUNTER — Other Ambulatory Visit: Payer: Self-pay | Admitting: Physician Assistant

## 2021-07-02 NOTE — Progress Notes (Signed)
[place] at [facility] [address] [place], [place] [address] [phone]  Date:  07/04/2021   Name:  [name]   DOB:  Jun 28, 1930   MRN:  [id number]  PCP:  Darreld Mclean, MD    Chief Complaint: 6 month follow up (Concerns/ questions: 1. pt says her entire body aches. 2. Circulation is worsening. /)   History of Present Illness:  [name] is a 88 y.o. 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 She is treated with Orencia per per Deveshwar for her lupus, RA-most recent visit was earlier this month Seen by cardiology, Dr. Stanford Breed in February Follow-up with oncology, Dr. Julien Nordmann also in February  She had a hip replacement in 11/2017- she fell and dislocated the hip and it was revised 12/19.  She then developed pain and swelling in her groin and was found to have formation of a pseudoaneurysm in 03/2018.  She underwent repair of the aneurysm and did well.  GI bleed in April 2021 but otherwise no recent hospitalizations  Last visit with myself was in December-at that time she was having some hip pain, she was concerned about a recurrent aneurysm so we got a CT of her pelvis.  No aneurysm, but she did have a nondisplaced pubic rami fracture  Shingrix- she got one dose 4-5 years ago Can get another COVID booster-advised her as such  She had a bmet in April, stable hyponatremia and mild renal insufficiency, stable mild anemia  She had a flex sig in 2021  She is having bilateral shoulder pain and bilateral hip pain - this is chronic and is her main concern at this time.  This pain can limit her activity somewhat  She notes she  may have nausea after eating This seemed to start after she started on daily lasix 20 and losartan 25 He leg swelling is much better - esp in the am they look good   She is using tramdol with tylenol and this does help relieve her pain although not totally-right now she takes tramadol once a day, occasionally twice She would like to try increasing to TID when needed  She is also on gabapentin  Weight one year ago 107 lbs Her grandson brought her to the clinic today.  He is in college and doing very well Wt Readings from Last 3 Encounters:  07/04/21 106 lb (48.1 kg)  06/26/21 109 lb 3.2 oz (49.5 kg)  06/12/21 104 lb (47.2 kg)  ' Patient Active Problem List   Diagnosis Date Noted   Rectal pain 10/10/2020   Acute posthemorrhagic anemia    Rectal bleeding 06/17/2019   Grade II internal hemorrhoids    Rectal ulcer    Popliteal artery occlusion, right ([place]) 04/10/2018   HTN (hypertension) 04/10/2018   Femoral artery pseudo-aneurysm, right ([place]) 04/09/2018   Anemia of chronic disease 02/24/2018   Unstable right hip arthroplasty 01/24/2018   History of revision of total replacement of right hip joint 01/24/2018   Hypovolemic shock ([place])    Hyperkalemia    Hyponatremia    Failed total hip arthroplasty ([place]) 11/27/2017   Status post revision of total hip 11/27/2017   Elevated cholesterol 10/11/2015   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  [place]) 10/04/2014   Bilateral leg edema 08/01/2014   Elevated BP 08/01/2014   Rheumatoid arthritis involving multiple joints ([place]) 05/30/2014   Status post total replacement of right hip 04/28/2014   Long-term use of high-risk medication 11/11/2013   Symptomatic anemia 11/11/2013   Neuropathic pain 07/07/2013   Constipation due to pain medication 07/07/2013   Protein-calorie malnutrition, severe ([place]) 06/08/2013   Lung cancer, Right upper lobe 05/08/2013   Sciatica of right side 08/13/2011   Osteoporosis 03/07/2010   PARESTHESIA 03/07/2010   CT, CHEST,  ABNORMAL 12/18/2008   ABNORMAL ECHOCARDIOGRAM 12/14/2008   SJOGREN'S SYNDROME 11/29/2008   HYPOGLYCEMIA 06/29/2006   RAYNAUD'S DISEASE 06/29/2006    Past Medical History:  Diagnosis Date   Arthralgia of multiple joints    followed by dr Gerilyn Nestle   Arthritis    Cardiomyopathy ([place])    Chronic constipation    Chronic inflammatory arthritis    rhemotolgist-  dr a. Gerilyn Nestle ([facility])   Dry eyes    eye drops used    GERD (gastroesophageal reflux disease)    H/O discoid lupus erythematosus    Hiatal hernia    History of colon polyps    Hypothyroidism    Iron deficiency anemia    LBBB (left bundle branch block) 2010   Mitchell's disease (erythromelalgia) ([place])    neurologist-  dr patel   Nocturia    Non-small cell cancer of right lung [facility]) surgeon-- dr gerhardt/  oncologist-  dr Julien Nordmann--- per lov notes no recurrence/   11-18-2017 per pt denies any symptoms   dx 2015--  Stage IIA (T2b,N0,M0) , +EGFR  mutation in exon 21, non-small cell adenocarcinoma right upper lobe---  s/p  Right upper lobectomy , right middley wedge resection and node dissection---  no chemo or radiation therapy   OA (osteoarthritis)    hands   Osteoporosis    PONV (postoperative nausea and vomiting)    likes phenergan   Raynaud's phenomenon 1965   Renal insufficiency    Rheumatoid arthritis ([place])    Sciatica    Scoliosis    Sjogren's syndrome [facility]     Past Surgical History:  Procedure Laterality Date   ANTERIOR HIP REVISION Right 11/27/2017   Procedure: RIGHT HIP ACETABULAR REVISION;  Surgeon: [name], MD;  Location: [facility];  Service: Orthopedics;  Laterality: Right;   ANTERIOR HIP REVISION Right 01/24/2018   Procedure: OPEN REDUCTION OF DISLOCATED ANTERIOR HIP WITH REVISION OF LINER AND HIP BALL;  Surgeon: [name], MD;  Location: [facility];  Service: Orthopedics;  Laterality: Right;   APPENDECTOMY  1950s   BIOPSY  04/14/2018   Procedure: BIOPSY;  Surgeon:  Yetta Flock, MD;  Location: [place];  Service: Gastroenterology;;   BIOPSY  04/16/2018   Procedure: BIOPSY;  Surgeon: Irving Copas., MD;  Location: [place];  Service: Gastroenterology;;   CARDIOVASCULAR STRESS TEST  12/2008    mild fixed basal to mid septal perfusion defect felt likely due to artifact from LBBB, no ischemia, EF 58%   COLONOSCOPY     COLONOSCOPY WITH PROPOFOL N/A 04/16/2018   Procedure: COLONOSCOPY WITH PROPOFOL;  Surgeon: Irving Copas., MD;  Location: [place];  Service: Gastroenterology;  Laterality: N/A;   ESOPHAGOGASTRODUODENOSCOPY (EGD) WITH PROPOFOL N/A 04/14/2018   Procedure: ESOPHAGOGASTRODUODENOSCOPY (EGD) WITH PROPOFOL;  Surgeon: Yetta Flock, MD;  Location: [place];  Service: Gastroenterology;  Laterality: N/A;   FEMORAL-POPLITEAL BYPASS GRAFT Right 04/10/2018   Procedure: REPAIR RIGHT FEMORAL ARTERY  PSEUDOANEURYSM, RETROPERITONEAL EXPOSURE OF ILIAC ARTERY, RIGHT POPLITEAL EMBOLECTOMY;  Surgeon: Angelia Mould, MD;  Location: [place];  Service: Vascular;  Laterality: Right;   FLEXIBLE SIGMOIDOSCOPY N/A 06/17/2019   Procedure: FLEXIBLE SIGMOIDOSCOPY;  Surgeon: Lavena Bullion, DO;  [facility];  Service: Gastroenterology;  Laterality: N/A;   HEMOSTASIS CLIP PLACEMENT  06/17/2019   Procedure: HEMOSTASIS CLIP PLACEMENT;  Surgeon: Lavena Bullion, DO;  [facility];  Service: Gastroenterology;;   LYMPH NODE DISSECTION Right 06/07/2013   Procedure: LYMPH NODE DISSECTION;  Surgeon: [name], MD;  Location: [place];  Service: Thoracic;  Laterality: Right;   PATCH ANGIOPLASTY Right 04/10/2018   Procedure: PATCH  ANGIOPLASTY OF RIGHT FEMORAL ARTERY USING BOVINE PATCH, PATCH ANGIOPLASTY OF RIGHT POPLITEAL ARTERY USING BOVINE PATCH;  Surgeon: Angelia Mould, MD;  Location: [place];  Service: Vascular;  Laterality: Right;   SCHLEROTHERAPY  06/17/2019   Procedure: [place] OF VARICES;   Surgeon: Lavena Bullion, DO;  [facility];  Service: Gastroenterology;;   [place]   "large incision from chest to up to shoulder, the nerves were tied together, for raynaud's   THORACOTOMY  06/07/2013   Procedure: MINI/LIMITED THORACOTOMY; right middle lobe wedge resection;  Surgeon: [name], MD;  Location: [place];  Service: Thoracic;;   TONSILLECTOMY  child   TOTAL ABDOMINAL HYSTERECTOMY  1980's    W/ BSO   TOTAL HIP ARTHROPLASTY Right 04/28/2014   Procedure: RIGHT TOTAL HIP ARTHROPLASTY ANTERIOR APPROACH;  Surgeon: [name], MD;  Location: [facility];  Service: Orthopedics;  Laterality: Right;   TRANSTHORACIC ECHOCARDIOGRAM  12/11/2008   ef 16-10%, grade 1 diastolic dysfunction/  mild LAE/  mild AR and MR/  trivial TR   VIDEO ASSISTED THORACOSCOPY (VATS)/WEDGE RESECTION Right 06/07/2013   Procedure: VIDEO ASSISTED THORACOSCOPY (VATS)/right upper lobectomy, On Q;  Surgeon: [name], MD;  [facility];  Service: Thoracic;  Laterality: Right;   VIDEO BRONCHOSCOPY N/A 06/07/2013   Procedure: VIDEO BRONCHOSCOPY;  Surgeon: [name], MD;  [facility]: [facility];  Service: Thoracic;  Laterality: N/A;   VIDEO BRONCHOSCOPY WITH ENDOBRONCHIAL NAVIGATION N/A 05/04/2013   Procedure: VIDEO BRONCHOSCOPY WITH ENDOBRONCHIAL NAVIGATION;  Surgeon: [name], MD;  [facility];  Service: Thoracic;  Laterality: N/A;    Social History   Tobacco Use   Smoking status: Never    Passive exposure: Past   Smokeless tobacco: Never  Vaping Use   Vaping Use: Never used  Substance Use Topics   Alcohol use: Not Currently   Drug use: Never    Family History  Problem Relation Age of Onset   Coronary artery disease Father    Colon cancer Father    Diabetes Father    Cancer Father        colon   Other Mother 61       MVA   Healthy Sister    Healthy Brother    Healthy Daughter    Hypothyroidism Daughter    Other Brother         killed in war   Pneumonia Sister    Healthy Daughter    Esophageal cancer Neg Hx    Kidney disease Neg Hx    Liver disease Neg Hx     Allergies  Allergen Reactions   Amlodipine Rash   Prochlorperazine Edisylate Anaphylaxis    Compazine--- tongue swells and rash   Aspirin Other (See Comments)    nose bleeds.  Cannot take NSAIDS    Cymbalta [Duloxetine Hcl] Diarrhea, Nausea And Vomiting and Other (See Comments)    Increased blood pressure   Pamelor [Nortriptyline Hcl] Diarrhea and Nausea Only    Increased Heart rate and BP    Medication list has been reviewed and updated.  Current Outpatient Medications on File Prior to Visit  Medication Sig Dispense Refill   Abatacept (ORENCIA IV) Inject into the vein every 28 (twenty-eight) days.     Acetaminophen (TYLENOL EXTRA STRENGTH PO) Take 1-2 tablets by mouth every 6 (six) hours as needed (pain).      Biotin 1000 MCG tablet Take 1,000 mcg by mouth daily.     Calcium Carbonate-Vitamin D 500-125 MG-UNIT TABS Take 1 tablet by mouth daily.     carvedilol (COREG) 3.125 MG tablet Take 1 tablet (3.125 mg total) by mouth 2 (two) times daily with a meal. 180 tablet 3   denosumab (PROLIA) 60 MG/ML SOSY injection Inject 60 mg into the skin every 6 (six) months.     docusate sodium (COLACE) 100 MG capsule Take 100 mg by mouth 2 (two) times daily.     furosemide (LASIX) 20 MG tablet Take 1 tablet (20 mg total) by mouth daily. 90 tablet 3   gabapentin (NEURONTIN) 300 MG capsule TAKE 1 CAPSULE BY MOUTH IN THE MORNING, 1 IN THE AFTERNOON, AND 2 AT BEDTIME 360 capsule 1   Glucosamine-Chondroit-Vit C-Mn (GLUCOSAMINE CHONDR 1500 COMPLX PO) Take 1 capsule by mouth daily.     levothyroxine (SYNTHROID) 75 MCG tablet Take 1 tablet (75 mcg total) by mouth daily before breakfast. 90 tablet 0   losartan (COZAAR) 25 MG tablet Take 1 tablet (25 mg total) by mouth daily. 90 tablet 3   methocarbamol (ROBAXIN) 500 MG tablet TAKE 1 TABLET BY MOUTH EVERY 6 HOURS AS NEEDED  FOR MUSCLE SPASMS. 40 tablet 0   Multiple Vitamin (MULTIVITAMIN) tablet Take 1 tablet by mouth daily.     omeprazole (PRILOSEC) 20 MG capsule TAKE 1 CAPSULE BY MOUTH EVERY DAY 90 capsule 3   Probiotic Product (PROBIOTIC PO) Take 1 capsule by mouth daily.      VITAMIN D PO Take by mouth daily.     No current facility-administered medications on file prior to visit.    Review of Systems:  As per HPI- otherwise negative.   Physical Examination: Vitals:   07/04/21 1043  BP: 122/80  Pulse: 81  Resp: 18  Temp: 97.9 F (36.6 C)  SpO2: 96%   Vitals:   07/04/21 1043  Weight: 106 lb (48.1 kg)  Height: 5' (1.524 m)   Body mass index is 20.7 kg/m. Ideal Body Weight: Weight in (lb) to have BMI = 25: 127.7  GEN: no acute distress.  Well-appearing elderly lady, very nicely dressed and groomed HEENT: Atraumatic, Normocephalic.  Ears and Nose: No external deformity. CV: RRR, No M/G/R. No JVD. No thrill. No extra heart sounds. PULM: CTA B, no wheezes, crackles, rhonchi. No retractions. No resp. distress. No accessory muscle use. ABD: S, NT, ND, EXTR: No c/c/e PSYCH: Normally interactive. Conversant.    Assessment and Plan: Bilateral hip pain - Plan: traMADol (ULTRAM) 50 MG tablet  Chronic pain in left shoulder - Plan: traMADol (ULTRAM) 50 MG tablet  Following up today. [name]'s main concern today is chronic pain in her shoulders and hips; she is taking approximately 1 tramadol per day along with Tylenol.  This helps some.  She would like to increase her tramadol to twice or  3 times daily if possible; this isreasonable as her pain is affecting her quality of life  Increased her monthly allotment of tramadol to 60 pills-cautioned her regarding sedation, do not drive after using this medication  I asked her to see me in about 6 months  Signed Lamar Blinks, MD

## 2021-07-04 ENCOUNTER — Ambulatory Visit: Payer: Medicare PPO | Admitting: Family Medicine

## 2021-07-04 VITALS — BP 122/80 | HR 81 | Temp 97.9°F | Resp 18 | Ht 60.0 in | Wt 106.0 lb

## 2021-07-04 DIAGNOSIS — M25552 Pain in left hip: Secondary | ICD-10-CM

## 2021-07-04 DIAGNOSIS — M25512 Pain in left shoulder: Secondary | ICD-10-CM | POA: Diagnosis not present

## 2021-07-04 DIAGNOSIS — M25551 Pain in right hip: Secondary | ICD-10-CM

## 2021-07-04 DIAGNOSIS — G8929 Other chronic pain: Secondary | ICD-10-CM

## 2021-07-04 MED ORDER — TRAMADOL HCL 50 MG PO TABS: 25.0000 mg | ORAL_TABLET | Freq: Three times a day (TID) | ORAL | 1 refills | Status: DC | PRN

## 2021-07-04 NOTE — Patient Instructions (Signed)
It was great to see you today- please see me in about 6 months for recheck Consider getting a 2nd dose of shingrix and a covid booster this summer   We can increase your tramadol to 60 pills a month- if this is not enough please let me know Don't drive after taking tramadol

## 2021-07-06 ENCOUNTER — Other Ambulatory Visit: Payer: Self-pay | Admitting: Family Medicine

## 2021-07-10 ENCOUNTER — Ambulatory Visit (HOSPITAL_COMMUNITY)
Admission: RE | Admit: 2021-07-10 | Discharge: 2021-07-10 | Disposition: A | Discharge status: Home / Self Care | Payer: Medicare PPO | Source: Ambulatory Visit | Attending: Rheumatology | Admitting: Rheumatology

## 2021-07-10 ENCOUNTER — Other Ambulatory Visit: Payer: Self-pay | Admitting: Pharmacist

## 2021-07-10 DIAGNOSIS — Z79899 Other long term (current) drug therapy: Secondary | ICD-10-CM

## 2021-07-10 DIAGNOSIS — M810 Age-related osteoporosis without current pathological fracture: Secondary | ICD-10-CM | POA: Diagnosis not present

## 2021-07-10 DIAGNOSIS — M069 Rheumatoid arthritis, unspecified: Secondary | ICD-10-CM | POA: Insufficient documentation

## 2021-07-10 DIAGNOSIS — M0609 Rheumatoid arthritis without rheumatoid factor, multiple sites: Secondary | ICD-10-CM | POA: Insufficient documentation

## 2021-07-10 DIAGNOSIS — Z111 Encounter for screening for respiratory tuberculosis: Secondary | ICD-10-CM | POA: Insufficient documentation

## 2021-07-10 MED ORDER — SODIUM CHLORIDE 0.9 % IV SOLN
500.0000 mg | INTRAVENOUS | Status: DC
  Administered 2021-07-10: 500 mg via INTRAVENOUS
  Filled 2021-07-10: qty 20

## 2021-07-10 MED ORDER — DENOSUMAB 60 MG/ML ~~LOC~~ SOSY: 60.0000 mg | PREFILLED_SYRINGE | Freq: Once | SUBCUTANEOUS | Status: AC

## 2021-07-10 MED ORDER — DENOSUMAB 60 MG/ML ~~LOC~~ SOSY
PREFILLED_SYRINGE | SUBCUTANEOUS | Status: AC
  Administered 2021-07-10: 60 mg via SUBCUTANEOUS
  Filled 2021-07-10: qty 1

## 2021-07-10 MED ORDER — DIPHENHYDRAMINE HCL 25 MG PO CAPS: 25.0000 mg | ORAL_CAPSULE | ORAL | Status: DC

## 2021-07-10 MED ORDER — ACETAMINOPHEN 325 MG PO TABS: 650.0000 mg | ORAL_TABLET | ORAL | Status: DC

## 2021-07-10 NOTE — Progress Notes (Addendum)
Next infusion scheduled for Orencia IV on 08/07/21 and due for updated orders. Diagnosis: RA  Dose: 500mg  every 28 days (appropriate based on last recorded weight of 46.7kg)  Last Clinic Visit: 06/26/21 Next Clinic Visit: 09/18/21  Last infusion: 07/10/21  Labs: CBC and CMP on 06/12/21 TB Gold: negative on 07/10/21   Orders placed for Orencia IV x 3 doses along with premedication of acetaminophen and diphenhydramine to be administered 30 minutes before medication infusion.  Standing CBC with diff/platelet and CMP with GFR orders placed to be drawn every 2 months.  Next TB gold due 07/11/22  Knox Saliva, PharmD, MPH, BCPS, CPP Clinical Pharmacist (Rheumatology and Pulmonology)

## 2021-07-15 LAB — QUANTIFERON-TB GOLD PLUS (RQFGPL)
QuantiFERON Mitogen Value: >10 IU/mL
QuantiFERON Nil Value: 0 IU/mL
QuantiFERON TB1 Ag Value: 0.04 IU/mL
QuantiFERON TB2 Ag Value: 0.03 IU/mL

## 2021-07-15 LAB — QUANTIFERON-TB GOLD PLUS: QuantiFERON-TB Gold Plus: NEGATIVE

## 2021-07-16 NOTE — Progress Notes (Signed)
TB gold negative

## 2021-08-07 ENCOUNTER — Encounter (HOSPITAL_COMMUNITY): Payer: Medicare PPO

## 2021-08-14 ENCOUNTER — Ambulatory Visit (HOSPITAL_COMMUNITY)
Admission: RE | Admit: 2021-08-14 | Discharge: 2021-08-14 | Disposition: A | Discharge status: Home / Self Care | Payer: Medicare PPO | Source: Ambulatory Visit | Attending: Rheumatology | Admitting: Rheumatology

## 2021-08-14 DIAGNOSIS — Z79899 Other long term (current) drug therapy: Secondary | ICD-10-CM | POA: Insufficient documentation

## 2021-08-14 DIAGNOSIS — M0609 Rheumatoid arthritis without rheumatoid factor, multiple sites: Secondary | ICD-10-CM | POA: Diagnosis not present

## 2021-08-14 LAB — CBC WITH DIFFERENTIAL/PLATELET
Abs Immature Granulocytes: 0.01 10*3/uL (ref 0.00–0.07)
Basophils Absolute: 0 10*3/uL (ref 0.0–0.1)
Basophils Relative: 1 %
Eosinophils Absolute: 0 10*3/uL (ref 0.0–0.5)
Eosinophils Relative: 1 %
HCT: 32.3 % — ABNORMAL LOW (ref 36.0–46.0)
Hemoglobin: 10.3 g/dL — ABNORMAL LOW (ref 12.0–15.0)
Immature Granulocytes: 0 %
Lymphocytes Relative: 20 %
Lymphs Abs: 0.8 10*3/uL (ref 0.7–4.0)
MCH: 29.5 pg (ref 26.0–34.0)
MCHC: 31.9 g/dL (ref 30.0–36.0)
MCV: 92.6 fL (ref 80.0–100.0)
Monocytes Absolute: 0.5 10*3/uL (ref 0.1–1.0)
Monocytes Relative: 12 %
Neutro Abs: 2.9 10*3/uL (ref 1.7–7.7)
Neutrophils Relative %: 66 %
Platelets: 201 10*3/uL (ref 150–400)
RBC: 3.49 MIL/uL — ABNORMAL LOW (ref 3.87–5.11)
RDW: 13.2 % (ref 11.5–15.5)
WBC: 4.3 10*3/uL (ref 4.0–10.5)
nRBC: 0 % (ref 0.0–0.2)

## 2021-08-14 LAB — COMPREHENSIVE METABOLIC PANEL
ALT: 12 U/L (ref 0–44)
AST: 23 U/L (ref 15–41)
Albumin: 3.9 g/dL (ref 3.5–5.0)
Alkaline Phosphatase: 38 U/L (ref 38–126)
Anion gap: 9 (ref 5–15)
BUN: 40 mg/dL — ABNORMAL HIGH (ref 8–23)
CO2: 25 mmol/L (ref 22–32)
Calcium: 9.2 mg/dL (ref 8.9–10.3)
Chloride: 98 mmol/L (ref 98–111)
Creatinine, Ser: 1.44 mg/dL — ABNORMAL HIGH (ref 0.44–1.00)
GFR, Estimated: 35 mL/min — ABNORMAL LOW (ref >60)
Glucose, Bld: 84 mg/dL (ref 70–99)
Potassium: 4.6 mmol/L (ref 3.5–5.1)
Sodium: 132 mmol/L — ABNORMAL LOW (ref 135–145)
Total Bilirubin: 0.4 mg/dL (ref 0.3–1.2)
Total Protein: 6.6 g/dL (ref 6.5–8.1)

## 2021-08-14 MED ORDER — DIPHENHYDRAMINE HCL 25 MG PO CAPS: 25.0000 mg | ORAL_CAPSULE | ORAL | Status: DC

## 2021-08-14 MED ORDER — ACETAMINOPHEN 325 MG PO TABS: 650.0000 mg | ORAL_TABLET | ORAL | Status: DC

## 2021-08-14 MED ORDER — SODIUM CHLORIDE 0.9 % IV SOLN
500.0000 mg | INTRAVENOUS | Status: DC
  Administered 2021-08-14: 500 mg via INTRAVENOUS
  Filled 2021-08-14: qty 20

## 2021-08-14 NOTE — Progress Notes (Signed)
Creatinine is elevated at 1.44 and GFR is low at 35.  Please clarify if the patient has been taking any NSAIDs or has had any other medication changes.  Red blood cell count, hemoglobin, and hematocrit are low and continue to trend down.  Please clarify if the patient has had an anemia work-up by her PCP yet.

## 2021-08-15 ENCOUNTER — Encounter: Payer: Self-pay | Admitting: Family Medicine

## 2021-08-19 NOTE — Progress Notes (Unsigned)
[facility] [address] [place], [place] [address] [phone]  Date:  08/26/2021   Name:  [name]   DOB:  [birth year]   MRN:  [id number]  PCP:  Pearline Cables, MD    Chief Complaint: No chief complaint on file.   History of Present Illness:  [name] is a 88 y.o. very pleasant female patient who presents with the following:  Pt seen today to recheck renal function and mild anemia Last seen by myself in URU:FYFSDEA of hypertension, lung cancer, Sjogren's syndrome, osteoporosis, complications from total hip replacement, hypothyroidism, discoid lupus, rheumatoid arthritis and complications from hip replacement  Most recent labs 6/28 showed mild bump in creat to 1.44- was 1 in March  Hg 10.3 - she did do a flex sig in 2021 and colonoscopy in 2020 Recent ferritin was elevated  She had her Orencia infusion last week per rheumatology   Patient Active Problem List   Diagnosis Date Noted   Rectal pain 10/10/2020   Acute posthemorrhagic anemia    Rectal bleeding 06/17/2019   Grade II internal hemorrhoids    Rectal ulcer    Popliteal artery occlusion, 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/10/2018   HTN (hypertension) 04/10/2018   Femoral artery pseudo-aneurysm, 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/09/2018   Anemia of chronic disease 02/24/2018   Unstable right hip arthroplasty 01/24/2018   History of revision of total replacement of right hip joint 01/24/2018   Hypovolemic shock (HCC)    Hyperkalemia    Hyponatremia    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   Status post revision of total hip 11/27/2017   Elevated cholesterol 10/11/2015   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HCC) 10/04/2014   Bilateral leg edema 08/01/2014   Elevated BP 08/01/2014   Rheumatoid arthritis involving multiple joints (HCC) 05/30/2014   Status post total replacement of right hip 04/28/2014   Long-term use of high-risk medication 11/11/2013   Symptomatic anemia  11/11/2013   Neuropathic pain 07/07/2013   Constipation due to pain medication 07/07/2013   Protein-calorie malnutrition, severe (HCC) 06/08/2013   Lung cancer, Right upper lobe 05/08/2013   Sciatica of right side 08/13/2011   Osteoporosis 03/07/2010   PARESTHESIA 03/07/2010   CT, CHEST, ABNORMAL 12/18/2008   ABNORMAL ECHOCARDIOGRAM 12/14/2008   SJOGREN'S SYNDROME 11/29/2008   HYPOGLYCEMIA 06/29/2006   RAYNAUD'S DISEASE 06/29/2006    Past Medical History:  Diagnosis Date   Arthralgia of multiple joints    followed by dr Sharmon Revere   Arthritis    Cardiomyopathy (HCC)    Chronic constipation    Chronic inflammatory arthritis    rhemotolgist-  dr a. Sharmon Revere ([facility])   Dry eyes    eye drops used    GERD (gastroesophageal reflux disease)    H/O discoid lupus erythematosus    Hiatal hernia    History of colon polyps    Hypothyroidism    Iron deficiency anemia    LBBB (left bundle branch block) 2010   Mitchell's disease (erythromelalgia) [facility])    neurologist-  dr patel   Nocturia    Non-small cell cancer of right lung [facility]) surgeon-- dr gerhardt/  oncologist-  dr Arbutus Ped--- per lov notes no recurrence/   11-18-2017 per pt denies any symptoms   dx 2015--  Stage IIA (T2b,N0,M0) , +EGFR  mutation in exon 21, non-small cell adenocarcinoma right upper lobe---  s/p  Right upper lobectomy , right middley wedge resection and node dissection---  no chemo or radiation therapy   OA (osteoarthritis)    hands   Osteoporosis    PONV (postoperative nausea and vomiting)    likes phenergan   Raynaud's phenomenon 1965   Renal insufficiency    Rheumatoid arthritis ([place])    Sciatica    Scoliosis    Sjogren's syndrome [facility])     Past Surgical History:  Procedure Laterality Date   ANTERIOR HIP REVISION Right 11/27/2017   Procedure: RIGHT HIP ACETABULAR REVISION;  Surgeon: [name], MD;  [facility];  Service: Orthopedics;  Laterality: Right;   ANTERIOR HIP  REVISION Right 01/24/2018   Procedure: OPEN REDUCTION OF DISLOCATED ANTERIOR HIP WITH REVISION OF LINER AND HIP BALL;  Surgeon: [name], MD;  [facility];  Service: Orthopedics;  Laterality: Right;   APPENDECTOMY  1950s   BIOPSY  04/14/2018   Procedure: BIOPSY;  Surgeon: Yetta Flock, MD;  Location: [place];  Service: Gastroenterology;;   BIOPSY  04/16/2018   Procedure: BIOPSY;  Surgeon: Irving Copas., MD;  Location: [place];  Service: Gastroenterology;;   CARDIOVASCULAR STRESS TEST  12/2008    mild fixed basal to mid septal perfusion defect felt likely due to artifact from LBBB, no ischemia, EF 58%   COLONOSCOPY     COLONOSCOPY WITH PROPOFOL N/A 04/16/2018   Procedure: COLONOSCOPY WITH PROPOFOL;  Surgeon: Irving Copas., MD;  Location: [place];  Service: Gastroenterology;  Laterality: N/A;   ESOPHAGOGASTRODUODENOSCOPY (EGD) WITH PROPOFOL N/A 04/14/2018   Procedure: ESOPHAGOGASTRODUODENOSCOPY (EGD) WITH PROPOFOL;  Surgeon: Yetta Flock, MD;  Location: [place];  Service: Gastroenterology;  Laterality: N/A;   FEMORAL-POPLITEAL BYPASS GRAFT Right 04/10/2018   Procedure: REPAIR RIGHT FEMORAL ARTERY PSEUDOANEURYSM, RETROPERITONEAL EXPOSURE OF ILIAC ARTERY, RIGHT POPLITEAL EMBOLECTOMY;  Surgeon: Angelia Mould, MD;  Location: [place];  Service: Vascular;  Laterality: Right;   FLEXIBLE SIGMOIDOSCOPY N/A 06/17/2019   Procedure: FLEXIBLE SIGMOIDOSCOPY;  Surgeon: Lavena Bullion, DO;  [facility];  Service: Gastroenterology;  Laterality: N/A;   HEMOSTASIS CLIP PLACEMENT  06/17/2019   Procedure: HEMOSTASIS CLIP PLACEMENT;  Surgeon: Lavena Bullion, DO;  [facility];  Service: Gastroenterology;;   LYMPH NODE DISSECTION Right 06/07/2013   Procedure: LYMPH NODE DISSECTION;  Surgeon: [name], MD;  Location: [place];  Service: Thoracic;  Laterality: Right;   PATCH ANGIOPLASTY Right 04/10/2018    Procedure: PATCH  ANGIOPLASTY OF RIGHT FEMORAL ARTERY USING BOVINE PATCH, PATCH ANGIOPLASTY OF RIGHT POPLITEAL ARTERY USING BOVINE PATCH;  Surgeon: Angelia Mould, MD;  Location: [place];  Service: Vascular;  Laterality: Right;   SCHLEROTHERAPY  06/17/2019   Procedure: Woodward Ku OF VARICES;  Surgeon: Lavena Bullion, DO;  [facility];  Service: Gastroenterology;;   [place]   "large incision from chest to up to shoulder, the nerves were tied together, for raynaud's   THORACOTOMY  06/07/2013   Procedure: MINI/LIMITED THORACOTOMY; right middle lobe wedge resection;  Surgeon: [name], MD;  Location: [place];  Service: Thoracic;;   TONSILLECTOMY  child   TOTAL ABDOMINAL HYSTERECTOMY  1980's    W/ BSO   TOTAL HIP ARTHROPLASTY Right 04/28/2014   Procedure: RIGHT TOTAL HIP ARTHROPLASTY ANTERIOR APPROACH;  Surgeon: [name], MD;  [facility];  Service: Orthopedics;  Laterality: Right;   TRANSTHORACIC ECHOCARDIOGRAM  12/11/2008   ef 97-94%, grade 1 diastolic dysfunction/  mild LAE/  mild AR and MR/  trivial TR   VIDEO  ASSISTED THORACOSCOPY (VATS)/WEDGE RESECTION Right 06/07/2013   Procedure: VIDEO ASSISTED THORACOSCOPY (VATS)/right upper lobectomy, On Q;  Surgeon: [name], MD;  Location: [place];  Service: Thoracic;  Laterality: Right;   VIDEO BRONCHOSCOPY N/A 06/07/2013   Procedure: VIDEO BRONCHOSCOPY;  Surgeon: [name], MD;  [facility] OR;  Service: Thoracic;  Laterality: N/A;   VIDEO BRONCHOSCOPY WITH ENDOBRONCHIAL NAVIGATION N/A 05/04/2013   Procedure: VIDEO BRONCHOSCOPY WITH ENDOBRONCHIAL NAVIGATION;  Surgeon: [name], MD;  [facility];  Service: Thoracic;  Laterality: N/A;    Social History   Tobacco Use   Smoking status: Never    Passive exposure: Past   Smokeless tobacco: Never  Vaping Use   Vaping Use: Never used  Substance Use Topics   Alcohol use: Not Currently   Drug use: Never     Family History  Problem Relation Age of Onset   Coronary artery disease Father    Colon cancer Father    Diabetes Father    Cancer Father        colon   Other Mother 46       MVA   Healthy Sister    Healthy Brother    Healthy Daughter    Hypothyroidism Daughter    Other Brother        killed in war   Pneumonia Sister    Healthy Daughter    Esophageal cancer Neg Hx    Kidney disease Neg Hx    Liver disease Neg Hx     Allergies  Allergen Reactions   Amlodipine Rash   Prochlorperazine Edisylate Anaphylaxis    Compazine--- tongue swells and rash   Aspirin Other (See Comments)    nose bleeds. Cannot take NSAIDS    Cymbalta [Duloxetine Hcl] Diarrhea, Nausea And Vomiting and Other (See Comments)    Increased blood pressure   Pamelor [Nortriptyline Hcl] Diarrhea and Nausea Only    Increased Heart rate and BP    Medication list has been reviewed and updated.  Current Outpatient Medications on File Prior to Visit  Medication Sig Dispense Refill   Abatacept (ORENCIA IV) Inject into the vein every 28 (twenty-eight) days.     Acetaminophen (TYLENOL EXTRA STRENGTH PO) Take 1-2 tablets by mouth every 6 (six) hours as needed (pain).      Biotin 1000 MCG tablet Take 1,000 mcg by mouth daily.     Calcium Carbonate-Vitamin D 500-125 MG-UNIT TABS Take 1 tablet by mouth daily.     carvedilol (COREG) 3.125 MG tablet Take 1 tablet (3.125 mg total) by mouth 2 (two) times daily with a meal. 180 tablet 3   denosumab (PROLIA) 60 MG/ML SOSY injection Inject 60 mg into the skin every 6 (six) months.     docusate sodium (COLACE) 100 MG capsule Take 100 mg by mouth 2 (two) times daily.     furosemide (LASIX) 20 MG tablet Take 1 tablet (20 mg total) by mouth daily. 90 tablet 3   gabapentin (NEURONTIN) 300 MG capsule TAKE 1 CAPSULE BY MOUTH IN THE MORNING, 1 IN THE AFTERNOON, AND 2 AT BEDTIME 360 capsule 1   Glucosamine-Chondroit-Vit C-Mn (GLUCOSAMINE CHONDR 1500 COMPLX PO) Take 1 capsule by  mouth daily.     levothyroxine (SYNTHROID) 75 MCG tablet Take 1 tablet (75 mcg total) by mouth daily before breakfast. 90 tablet 0   losartan (COZAAR) 25 MG tablet Take 1 tablet (25 mg total) by mouth daily. 90 tablet 3   methocarbamol (ROBAXIN) 500 MG  tablet TAKE 1 TABLET BY MOUTH EVERY 6 HOURS AS NEEDED FOR MUSCLE SPASMS. 40 tablet 0   Multiple Vitamin (MULTIVITAMIN) tablet Take 1 tablet by mouth daily.     omeprazole (PRILOSEC) 20 MG capsule TAKE 1 CAPSULE BY MOUTH EVERY DAY 90 capsule 3   Probiotic Product (PROBIOTIC PO) Take 1 capsule by mouth daily.      traMADol (ULTRAM) 50 MG tablet Take 0.5-1 tablets (25-50 mg total) by mouth every 8 (eight) hours as needed. Do not combine with other pain medication 60 tablet 1   VITAMIN D PO Take by mouth daily.     No current facility-administered medications on file prior to visit.    Review of Systems:  As per HPI- otherwise negative.   Physical Examination: There were no vitals filed for this visit. There were no vitals filed for this visit. There is no height or weight on file to calculate BMI. Ideal Body Weight:    GEN: no acute distress. HEENT: Atraumatic, Normocephalic.  Ears and Nose: No external deformity. CV: RRR, No M/G/R. No JVD. No thrill. No extra heart sounds. PULM: CTA B, no wheezes, crackles, rhonchi. No retractions. No resp. distress. No accessory muscle use. ABD: S, NT, ND, +BS. No rebound. No HSM. EXTR: No c/c/e PSYCH: Normally interactive. Conversant.    Assessment and Plan: ***  Signed Lamar Blinks, MD

## 2021-08-26 ENCOUNTER — Ambulatory Visit: Payer: Medicare PPO | Admitting: Family Medicine

## 2021-08-26 ENCOUNTER — Encounter: Payer: Self-pay | Admitting: Family Medicine

## 2021-08-26 VITALS — BP 122/70 | HR 78 | Temp 98.0°F | Resp 18 | Ht 60.0 in | Wt 106.6 lb

## 2021-08-26 DIAGNOSIS — M25512 Pain in left shoulder: Secondary | ICD-10-CM | POA: Diagnosis not present

## 2021-08-26 DIAGNOSIS — E039 Hypothyroidism, unspecified: Secondary | ICD-10-CM

## 2021-08-26 DIAGNOSIS — D649 Anemia, unspecified: Secondary | ICD-10-CM

## 2021-08-26 DIAGNOSIS — G8929 Other chronic pain: Secondary | ICD-10-CM | POA: Diagnosis not present

## 2021-08-26 DIAGNOSIS — R110 Nausea: Secondary | ICD-10-CM

## 2021-08-26 DIAGNOSIS — R300 Dysuria: Secondary | ICD-10-CM

## 2021-08-26 DIAGNOSIS — N289 Disorder of kidney and ureter, unspecified: Secondary | ICD-10-CM | POA: Diagnosis not present

## 2021-08-26 DIAGNOSIS — M25552 Pain in left hip: Secondary | ICD-10-CM | POA: Diagnosis not present

## 2021-08-26 DIAGNOSIS — M25551 Pain in right hip: Secondary | ICD-10-CM

## 2021-08-26 LAB — BASIC METABOLIC PANEL
BUN: 45 mg/dL — ABNORMAL HIGH (ref 6–23)
CO2: 30 mEq/L (ref 19–32)
Calcium: 9.4 mg/dL (ref 8.4–10.5)
Chloride: 93 mEq/L — ABNORMAL LOW (ref 96–112)
Creatinine, Ser: 1.34 mg/dL — ABNORMAL HIGH (ref 0.40–1.20)
GFR: 35.34 mL/min — ABNORMAL LOW (ref >60.00)
Glucose, Bld: 75 mg/dL (ref 70–99)
Potassium: 5.1 mEq/L (ref 3.5–5.1)
Sodium: 129 mEq/L — ABNORMAL LOW (ref 135–145)

## 2021-08-26 LAB — FOLATE: Folate: >24.2 ng/mL (ref >5.9)

## 2021-08-26 LAB — CBC
HCT: 32.4 % — ABNORMAL LOW (ref 36.0–46.0)
Hemoglobin: 10.9 g/dL — ABNORMAL LOW (ref 12.0–15.0)
MCHC: 33.7 g/dL (ref 30.0–36.0)
MCV: 89.9 fl (ref 78.0–100.0)
Platelets: 198 10*3/uL (ref 150.0–400.0)
RBC: 3.6 Mil/uL — ABNORMAL LOW (ref 3.87–5.11)
RDW: 13.6 % (ref 11.5–15.5)
WBC: 4.7 10*3/uL (ref 4.0–10.5)

## 2021-08-26 LAB — FERRITIN: Ferritin: 331.4 ng/mL — ABNORMAL HIGH (ref 10.0–291.0)

## 2021-08-26 LAB — VITAMIN B12: Vitamin B-12: 743 pg/mL (ref 211–911)

## 2021-08-26 LAB — TSH: TSH: 1.33 u[IU]/mL (ref 0.35–5.50)

## 2021-08-26 MED ORDER — CEPHALEXIN 500 MG PO CAPS
500.0000 mg | ORAL_CAPSULE | Freq: Two times a day (BID) | ORAL | 0 refills | Status: DC
Start: 2021-08-26 — End: 2021-09-14

## 2021-08-26 MED ORDER — HYDROCODONE-ACETAMINOPHEN 5-325 MG PO TABS: 0.5000 | ORAL_TABLET | Freq: Three times a day (TID) | ORAL | 0 refills | Status: DC | PRN

## 2021-08-26 NOTE — Patient Instructions (Addendum)
Please hold the losartan for now I will touch base with Dr Stanford Breed about your furosemide We will get labs today and check on your urine, kidney function and your blood counts Please complete and return the stool sample when you can  I will place a referral to see DR Ninfa Linden for your shoulder as well

## 2021-08-26 NOTE — Addendum Note (Signed)
Addended by: Kelle Darting A on: 08/26/2021 01:21 PM   Modules accepted: Orders

## 2021-08-27 LAB — URINE CULTURE
MICRO NUMBER:: 13624829
Result:: NO GROWTH
SPECIMEN QUALITY:: ADEQUATE

## 2021-08-28 ENCOUNTER — Encounter: Payer: Self-pay | Admitting: Family Medicine

## 2021-09-02 ENCOUNTER — Telehealth: Payer: Self-pay

## 2021-09-02 ENCOUNTER — Encounter: Payer: Self-pay | Admitting: Family Medicine

## 2021-09-02 ENCOUNTER — Other Ambulatory Visit: Payer: Medicare PPO

## 2021-09-02 DIAGNOSIS — D649 Anemia, unspecified: Secondary | ICD-10-CM

## 2021-09-02 NOTE — Progress Notes (Signed)
HPI: FU CHF. Nuclear study 2010 showed ejection fraction 58% with fixed septal defect related to left bundle branch block and no ischemia.  Echocardiogram February 2020 showed normal LV systolic function grade 1 diastolic dysfunction, mild left atrial enlargement, mild aortic insufficiency.  Monitor April 2021 showed occasional PVC and brief episode of SVT.  Patient has been diagnosed with lung cancer.  CT February 2023 showed widespread pulmonary nodules increasing in size and emphysema. Echocardiogram March 2023 showed ejection fraction 40 to 26%, grade 1 diastolic dysfunction, mild mitral regurgitation, mild aortic insufficiency.  Since last seen, she has occasional mild dyspnea but she denies chest pain.  Her pedal edema has resolved.  She states her blood pressure has been labile.  Current Outpatient Medications  Medication Sig Dispense Refill   Abatacept (ORENCIA IV) Inject into the vein every 28 (twenty-eight) days.     Acetaminophen (TYLENOL EXTRA STRENGTH PO) Take 1-2 tablets by mouth every 6 (six) hours as needed (pain).      Biotin 1000 MCG tablet Take 1,000 mcg by mouth daily.     Calcium Carbonate-Vitamin D 500-125 MG-UNIT TABS Take 1 tablet by mouth daily.     carvedilol (COREG) 3.125 MG tablet Take 1 tablet (3.125 mg total) by mouth 2 (two) times daily with a meal. 180 tablet 3   cephALEXin (KEFLEX) 500 MG capsule Take 1 capsule (500 mg total) by mouth 2 (two) times daily. 10 capsule 0   denosumab (PROLIA) 60 MG/ML SOSY injection Inject 60 mg into the skin every 6 (six) months.     docusate sodium (COLACE) 100 MG capsule Take 100 mg by mouth 2 (two) times daily.     gabapentin (NEURONTIN) 300 MG capsule TAKE 1 CAPSULE BY MOUTH IN THE MORNING, 1 IN THE AFTERNOON, AND 2 AT BEDTIME 360 capsule 1   Glucosamine-Chondroit-Vit C-Mn (GLUCOSAMINE CHONDR 1500 COMPLX PO) Take 1 capsule by mouth daily.     HYDROcodone-acetaminophen (NORCO/VICODIN) 5-325 MG tablet Take 0.5-1 tablets by  mouth every 8 (eight) hours as needed. 30 tablet 0   levothyroxine (SYNTHROID) 75 MCG tablet TAKE 1 TABLET BY MOUTH DAILY BEFORE BREAKFAST. 90 tablet 0   methocarbamol (ROBAXIN) 500 MG tablet TAKE 1 TABLET BY MOUTH EVERY 6 HOURS AS NEEDED FOR MUSCLE SPASMS. 40 tablet 0   Multiple Vitamin (MULTIVITAMIN) tablet Take 1 tablet by mouth daily.     omeprazole (PRILOSEC) 20 MG capsule TAKE 1 CAPSULE BY MOUTH EVERY DAY 90 capsule 3   Probiotic Product (PROBIOTIC PO) Take 1 capsule by mouth daily.      traMADol (ULTRAM) 50 MG tablet Take 0.5-1 tablets (25-50 mg total) by mouth every 8 (eight) hours as needed. Do not combine with other pain medication 60 tablet 1   VITAMIN D PO Take by mouth daily.     furosemide (LASIX) 20 MG tablet Take 1 tablet (20 mg total) by mouth daily. 90 tablet 3   No current facility-administered medications for this visit.     Past Medical History:  Diagnosis Date   Arthralgia of multiple joints    followed by dr Gerilyn Nestle   Arthritis    Cardiomyopathy ([place])    Chronic constipation    Chronic inflammatory arthritis    rhemotolgist-  dr a. Gerilyn Nestle ([facility])   Dry eyes    eye drops used    GERD (gastroesophageal reflux disease)    H/O discoid lupus erythematosus    Hiatal hernia    History of colon  polyps    Hypothyroidism    Iron deficiency anemia    LBBB (left bundle branch block) 2010   Mitchell's disease (erythromelalgia) [facility])    neurologist-  dr patel   Nocturia    Non-small cell cancer of right lung [facility]) surgeon-- dr gerhardt/  oncologist-  dr Arbutus Ped--- per lov notes no recurrence/   11-18-2017 per pt denies any symptoms   dx 2015--  Stage IIA (T2b,N0,M0) , +EGFR  mutation in exon 21, non-small cell adenocarcinoma right upper lobe---  s/p  Right upper lobectomy , right middley wedge resection and node dissection---  no chemo or radiation therapy   OA (osteoarthritis)    hands   Osteoporosis    PONV (postoperative nausea and vomiting)     likes phenergan   Raynaud's phenomenon 1965   Renal insufficiency    Rheumatoid arthritis (HCC)    Sciatica    Scoliosis    Sjogren's syndrome [facility])     Past Surgical History:  Procedure Laterality Date   ANTERIOR HIP REVISION Right 11/27/2017   Procedure: RIGHT HIP ACETABULAR REVISION;  Surgeon: Kathryne Hitch, MD;  [facility];  Service: Orthopedics;  Laterality: Right;   ANTERIOR HIP REVISION Right 01/24/2018   Procedure: OPEN REDUCTION OF DISLOCATED ANTERIOR HIP WITH REVISION OF LINER AND HIP BALL;  Surgeon: Kathryne Hitch, MD;  [facility];  Service: Orthopedics;  Laterality: Right;   APPENDECTOMY  1950s   BIOPSY  04/14/2018   Procedure: BIOPSY;  Surgeon: Benancio Deeds, MD;  Location: [facility] ENDOSCOPY;  Service: Gastroenterology;;   BIOPSY  04/16/2018   Procedure: BIOPSY;  Surgeon: Lemar Lofty., MD;  Location: [facility] ENDOSCOPY;  Service: Gastroenterology;;   CARDIOVASCULAR STRESS TEST  12/2008    mild fixed basal to mid septal perfusion defect felt likely due to artifact from LBBB, no ischemia, EF 58%   COLONOSCOPY     COLONOSCOPY WITH PROPOFOL N/A 04/16/2018   Procedure: COLONOSCOPY WITH PROPOFOL;  Surgeon: Lemar Lofty., MD;  Location: [facility] ENDOSCOPY;  Service: Gastroenterology;  Laterality: N/A;   ESOPHAGOGASTRODUODENOSCOPY (EGD) WITH PROPOFOL N/A 04/14/2018   Procedure: ESOPHAGOGASTRODUODENOSCOPY (EGD) WITH PROPOFOL;  Surgeon: Benancio Deeds, MD;  Location: [facility] ENDOSCOPY;  Service: Gastroenterology;  Laterality: N/A;   FEMORAL-POPLITEAL BYPASS GRAFT Right 04/10/2018   Procedure: REPAIR RIGHT FEMORAL ARTERY PSEUDOANEURYSM, RETROPERITONEAL EXPOSURE OF ILIAC ARTERY, RIGHT POPLITEAL EMBOLECTOMY;  Surgeon: Chuck Hint, MD;  Location: [facility] OR;  Service: Vascular;  Laterality: Right;   FLEXIBLE SIGMOIDOSCOPY N/A 06/17/2019   Procedure: FLEXIBLE SIGMOIDOSCOPY;  Surgeon: Shellia Cleverly, DO;  [facility];  Service:  Gastroenterology;  Laterality: N/A;   HEMOSTASIS CLIP PLACEMENT  06/17/2019   Procedure: HEMOSTASIS CLIP PLACEMENT;  Surgeon: Shellia Cleverly, DO;  [facility];  Service: Gastroenterology;;   LYMPH NODE DISSECTION Right 06/07/2013   Procedure: LYMPH NODE DISSECTION;  Surgeon: Delight Ovens, MD;  [facility];  Service: Thoracic;  Laterality: Right;   PATCH ANGIOPLASTY Right 04/10/2018   Procedure: PATCH  ANGIOPLASTY OF RIGHT FEMORAL ARTERY USING BOVINE PATCH, PATCH ANGIOPLASTY OF RIGHT POPLITEAL ARTERY USING BOVINE PATCH;  Surgeon: Chuck Hint, MD;  Location: [facility] OR;  Service: Vascular;  Laterality: Right;   SCHLEROTHERAPY  06/17/2019   Procedure: Theresia Majors OF VARICES;  Surgeon: Shellia Cleverly, DO;  [facility];  Service: Gastroenterology;;   THORACIC SYMPATHETECTOMY  1965   "large incision from chest to up to shoulder, the nerves were tied together, for raynaud's  THORACOTOMY  06/07/2013   Procedure: MINI/LIMITED THORACOTOMY; right middle lobe wedge resection;  Surgeon: Delight Ovens, MD;  Location: [facility] OR;  Service: Thoracic;;   TONSILLECTOMY  child   TOTAL ABDOMINAL HYSTERECTOMY  1980's    W/ BSO   TOTAL HIP ARTHROPLASTY Right 04/28/2014   Procedure: RIGHT TOTAL HIP ARTHROPLASTY ANTERIOR APPROACH;  Surgeon: Kathryne Hitch, MD;  [facility];  Service: Orthopedics;  Laterality: Right;   TRANSTHORACIC ECHOCARDIOGRAM  12/11/2008   ef 45-50%, grade 1 diastolic dysfunction/  mild LAE/  mild AR and MR/  trivial TR   VIDEO ASSISTED THORACOSCOPY (VATS)/WEDGE RESECTION Right 06/07/2013   Procedure: VIDEO ASSISTED THORACOSCOPY (VATS)/right upper lobectomy, On Q;  Surgeon: Delight Ovens, MD;  Location: [facility] OR;  Service: Thoracic;  Laterality: Right;   VIDEO BRONCHOSCOPY N/A 06/07/2013   Procedure: VIDEO BRONCHOSCOPY;  Surgeon: Delight Ovens, MD;  Location: [facility] OR;  Service: Thoracic;  Laterality: N/A;   VIDEO BRONCHOSCOPY WITH  ENDOBRONCHIAL NAVIGATION N/A 05/04/2013   Procedure: VIDEO BRONCHOSCOPY WITH ENDOBRONCHIAL NAVIGATION;  Surgeon: Delight Ovens, MD;  Location: [facility] OR;  Service: Thoracic;  Laterality: N/A;    Social History   Socioeconomic History   Marital status: Widowed    Spouse name: Not on file   Number of children: 2   Years of education: Not on file   Highest education level: Not on file  Occupational History   Occupation: n/a  Tobacco Use   Smoking status: Never    Passive exposure: Past   Smokeless tobacco: Never  Vaping Use   Vaping Use: Never used  Substance and Sexual Activity   Alcohol use: Not Currently   Drug use: Never   Sexual activity: Not Currently    Birth control/protection: Surgical  Other Topics Concern   Not on file  Social History Narrative   Lives with husband, daughter and grandchild local.   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   Social Determinants of Health   Financial Resource Strain: Low Risk  (05/07/2021)   Overall Financial Resource Strain (CARDIA)    Difficulty of Paying Living Expenses: Not hard at all  Food Insecurity: No Food Insecurity (05/07/2021)   Hunger Vital Sign    Worried About Running Out of Food in the Last Year: Never true    Ran Out of Food in the Last Year: Never true  Transportation Needs: No Transportation Needs (05/07/2021)   PRAPARE - Administrator, Civil Service (Medical): No    Lack of Transportation (Non-Medical): No  Physical Activity: Insufficiently Active (05/07/2021)   Exercise Vital Sign    Days of Exercise per Week: 3 days    Minutes of Exercise per Session: 30 min  Stress: No Stress Concern Present (05/07/2021)   [employer] of Occupational Health - Occupational Stress Questionnaire    Feeling of Stress : Not at all  Social Connections: Moderately Integrated (05/07/2021)   Social Connection and Isolation Panel [NHANES]    Frequency of Communication with Friends and  Family: More than three times a week    Frequency of Social Gatherings with Friends and Family: More than three times a week    Attends Religious Services: More than 4 times per year    Active Member of [employer] or Organizations: Yes    Attends Banker Meetings: 1 to 4 times per year    Marital Status: Widowed  Intimate Partner Violence: Not At Risk (05/07/2021)  Humiliation, Afraid, Rape, and Kick questionnaire    Fear of Current or Ex-Partner: No    Emotionally Abused: No    Physically Abused: No    Sexually Abused: No    Family History  Problem Relation Age of Onset   Coronary artery disease Father    Colon cancer Father    Diabetes Father    Cancer Father        colon   Other Mother 20       MVA   Healthy Sister    Healthy Brother    Healthy Daughter    Hypothyroidism Daughter    Other Brother        killed in war   Pneumonia Sister    Healthy Daughter    Esophageal cancer Neg Hx    Kidney disease Neg Hx    Liver disease Neg Hx     ROS: Arthralgias but no fevers or chills, productive cough, hemoptysis, dysphasia, odynophagia, melena, hematochezia, dysuria, hematuria, rash, seizure activity, orthopnea, PND, pedal edema, claudication. Remaining systems are negative.  Physical Exam: Well-developed frail in no acute distress.  Skin is warm and dry.  HEENT is normal.  Neck is supple.  Chest is clear to auscultation with normal expansion.  Cardiovascular exam is regular rate and rhythm.  Abdominal exam nontender or distended. No masses palpated. Extremities show no edema. neuro grossly intact  A/P  1 cardiomyopathy-question secondary to dyssynchrony.  Continue beta-blocker at present dose.  Given age and ongoing lung cancer we will be conservative in our evaluation.  2 chronic combined systolic/diastolic congestive heart failure-pedal edema has resolved.  She has had problems with hyponatremia.  I have recommended that she decrease Lasix to 20 mg every  other day.  If her edema worsens we can resume 20 mg daily.  3 history of palpitations-continue beta-blocker.  4 left bundle branch block  5 lung cancer-Per oncology.  Follow-up CT scan is scheduled for August.  Kirk Ruths, MD

## 2021-09-02 NOTE — Telephone Encounter (Signed)
Spoke with Lisa patient's daughter and informed her that she will need to recollect specimen due to incorrect collect and will leave a kit up front to pick up for recollection. Lisa verbalized understanding with no further questions and will pick up a new kit.  

## 2021-09-04 ENCOUNTER — Other Ambulatory Visit (INDEPENDENT_AMBULATORY_CARE_PROVIDER_SITE_OTHER): Payer: Medicare PPO

## 2021-09-04 DIAGNOSIS — D649 Anemia, unspecified: Secondary | ICD-10-CM | POA: Diagnosis not present

## 2021-09-04 NOTE — Addendum Note (Signed)
Addended by: Kelle Darting A on: 09/04/2021 02:51 PM   Modules accepted: Orders

## 2021-09-06 LAB — FECAL OCCULT BLOOD, IMMUNOCHEMICAL: Fecal Occult Bld: NEGATIVE

## 2021-09-07 ENCOUNTER — Encounter: Payer: Self-pay | Admitting: Family Medicine

## 2021-09-07 DIAGNOSIS — R300 Dysuria: Secondary | ICD-10-CM

## 2021-09-09 ENCOUNTER — Other Ambulatory Visit: Payer: Self-pay | Admitting: Family Medicine

## 2021-09-10 ENCOUNTER — Ambulatory Visit: Payer: Medicare PPO | Admitting: Orthopaedic Surgery

## 2021-09-10 ENCOUNTER — Ambulatory Visit (HOSPITAL_BASED_OUTPATIENT_CLINIC_OR_DEPARTMENT_OTHER): Payer: Medicare PPO

## 2021-09-11 ENCOUNTER — Ambulatory Visit (HOSPITAL_COMMUNITY)
Admission: RE | Admit: 2021-09-11 | Discharge: 2021-09-11 | Disposition: A | Discharge status: Home / Self Care | Payer: Medicare PPO | Source: Ambulatory Visit | Attending: Rheumatology | Admitting: Rheumatology

## 2021-09-11 ENCOUNTER — Encounter: Payer: Self-pay | Admitting: Cardiology

## 2021-09-11 ENCOUNTER — Ambulatory Visit: Payer: Medicare PPO | Admitting: Cardiology

## 2021-09-11 VITALS — BP 142/70 | HR 70 | Ht 60.0 in | Wt 105.1 lb

## 2021-09-11 DIAGNOSIS — I5040 Unspecified combined systolic (congestive) and diastolic (congestive) heart failure: Secondary | ICD-10-CM

## 2021-09-11 DIAGNOSIS — I420 Dilated cardiomyopathy: Secondary | ICD-10-CM

## 2021-09-11 DIAGNOSIS — M0609 Rheumatoid arthritis without rheumatoid factor, multiple sites: Secondary | ICD-10-CM | POA: Diagnosis not present

## 2021-09-11 DIAGNOSIS — R002 Palpitations: Secondary | ICD-10-CM

## 2021-09-11 MED ORDER — CARVEDILOL 3.125 MG PO TABS: 3.1250 mg | ORAL_TABLET | Freq: Two times a day (BID) | ORAL | 3 refills | Status: DC

## 2021-09-11 MED ORDER — SODIUM CHLORIDE 0.9 % IV SOLN
500.0000 mg | INTRAVENOUS | Status: DC
  Administered 2021-09-11: 500 mg via INTRAVENOUS
  Filled 2021-09-11: qty 20

## 2021-09-11 MED ORDER — ACETAMINOPHEN 325 MG PO TABS: 650.0000 mg | ORAL_TABLET | ORAL | Status: DC

## 2021-09-11 MED ORDER — DIPHENHYDRAMINE HCL 25 MG PO CAPS: 25.0000 mg | ORAL_CAPSULE | ORAL | Status: DC

## 2021-09-11 MED ORDER — FUROSEMIDE 20 MG PO TABS: 20.0000 mg | ORAL_TABLET | ORAL | 3 refills | Status: DC

## 2021-09-11 NOTE — Patient Instructions (Signed)
  Follow-Up: At [facility], you and your health needs are our priority.  As part of our continuing mission to provide you with exceptional heart care, we have created designated Provider Care Teams.  These Care Teams include your primary Cardiologist (physician) and Advanced Practice Providers (APPs -  Physician Assistants and Nurse Practitioners) who all work together to provide you with the care you need, when you need it.  We recommend signing up for the patient portal called "MyChart".  Sign up information is provided on this After Visit Summary.  MyChart is used to connect with patients for Virtual Visits (Telemedicine).  Patients are able to view lab/test results, encounter notes, upcoming appointments, etc.  Non-urgent messages can be sent to your provider as well.   To learn more about what you can do with MyChart, go to [email or web address].    Your next appointment:   3 month(s)  The format for your next appointment:   In Person  Provider:   Kirk Ruths, MD     Important Information About Sugar

## 2021-09-12 ENCOUNTER — Ambulatory Visit (INDEPENDENT_AMBULATORY_CARE_PROVIDER_SITE_OTHER): Payer: Medicare PPO

## 2021-09-12 ENCOUNTER — Ambulatory Visit: Payer: Medicare PPO | Admitting: Orthopaedic Surgery

## 2021-09-12 ENCOUNTER — Ambulatory Visit: Payer: Self-pay

## 2021-09-12 DIAGNOSIS — M25512 Pain in left shoulder: Secondary | ICD-10-CM

## 2021-09-12 DIAGNOSIS — M25551 Pain in right hip: Secondary | ICD-10-CM

## 2021-09-12 DIAGNOSIS — G8929 Other chronic pain: Secondary | ICD-10-CM | POA: Diagnosis not present

## 2021-09-12 DIAGNOSIS — M25511 Pain in right shoulder: Secondary | ICD-10-CM | POA: Diagnosis not present

## 2021-09-12 MED ORDER — METHYLPREDNISOLONE ACETATE 40 MG/ML IJ SUSP
40.0000 mg | INTRAMUSCULAR | Status: AC | PRN
  Administered 2021-09-12: 40 mg via INTRA_ARTICULAR

## 2021-09-12 MED ORDER — LIDOCAINE HCL 1 % IJ SOLN
3.0000 mL | INTRAMUSCULAR | Status: AC | PRN
  Administered 2021-09-12: 3 mL

## 2021-09-12 NOTE — Progress Notes (Signed)
Office Visit Note   Patient: [name]           Date of Birth: [birth year]           MRN: [id number] Visit Date: 09/12/2021              Requested by: Darreld Mclean, MD [address] [place],  [place] [address] PCP: Darreld Mclean, MD   Assessment & Plan: Visit Diagnoses:  1. Chronic left shoulder pain   2. Pain in right hip   3. Chronic right shoulder pain     Plan: Per her request I did provide a steroid injection of both shoulders which she tolerated well.  Follow-up is as needed.  All question concerns were answered and addressed.  Follow-Up Instructions: Return if symptoms worsen or fail to improve.   Orders:  Orders Placed This Encounter  Procedures   Large Joint Inj   Large Joint Inj   XR HIP UNILAT W OR W/O PELVIS 1V RIGHT   No orders of the defined types were placed in this encounter.     Procedures: Large Joint Inj: R subacromial bursa on 09/12/2021 4:18 PM Indications: pain and diagnostic evaluation Details: 22 G 1.5 in needle  Arthrogram: No  Medications: 3 mL lidocaine 1 %; 40 mg methylPREDNISolone acetate 40 MG/ML Outcome: tolerated well, no immediate complications Procedure, treatment alternatives, risks and benefits explained, specific risks discussed. Consent was given by the patient. Immediately prior to procedure a time out was called to verify the correct patient, procedure, equipment, support staff and site/side marked as required. Patient was prepped and draped in the usual sterile fashion.    Large Joint Inj: L subacromial bursa on 09/12/2021 4:18 PM Indications: pain and diagnostic evaluation Details: 22 G 1.5 in needle  Arthrogram: No  Medications: 3 mL lidocaine 1 %; 40 mg methylPREDNISolone acetate 40 MG/ML Outcome: tolerated well, no immediate complications Procedure, treatment alternatives, risks and benefits explained, specific risks discussed. Consent was given by the patient. Immediately prior to  procedure a time out was called to verify the correct patient, procedure, equipment, support staff and site/side marked as required. Patient was prepped and draped in the usual sterile fashion.       Clinical Data: No additional findings.   Subjective: Chief Complaint  Patient presents with   Left Shoulder - Pain   Right Shoulder - Pain   Right Hip - Pain  The patient is an 88 year old female well-known to me.  She has severe end-stage arthritis of both her shoulders.  We replaced her right hip many years ago.  She still gets some right hip and groin pain on occasion.  She ambulates with a walker.  She is requesting injection in both her shoulders today.  It has been a very long period time since she has had any type of injections.  She is not a diabetic.  She says her blood count in terms of her hemoglobin drops at times and they are following this.  HPI  Review of Systems   Objective: Vital Signs: There were no vitals taken for this visit.  Physical Exam She is alert and orient x3 and in no acute distress Ortho Exam Examination of both shoulders shows glenohumeral grinding and severe pain and weakness of both shoulders.  Her right operative hip moves smoothly and fluidly with some pain in the groin. Specialty Comments:  No specialty comments available.  Imaging: XR HIP UNILAT W OR W/O  PELVIS 1V RIGHT  Result Date: 09/12/2021 An AP pelvis and lateral right hip shows a right total hip arthroplasty with no complicating features or acute findings.    PMFS History: Patient Active Problem List   Diagnosis Date Noted   Rectal pain 10/10/2020   Acute posthemorrhagic anemia    Rectal bleeding 06/17/2019   Grade II internal hemorrhoids    Rectal ulcer    Popliteal artery occlusion, right ([place]) 04/10/2018   HTN (hypertension) 04/10/2018   Femoral artery pseudo-aneurysm, 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/09/2018   Anemia of chronic disease 02/24/2018   Unstable right hip arthroplasty 01/24/2018    History of revision of total replacement of right hip joint 01/24/2018   Hypovolemic shock ([place])    Hyperkalemia    Hyponatremia    Failed total hip arthroplasty ([place]) 11/27/2017   Status post revision of total hip 11/27/2017   Elevated cholesterol 10/11/2015   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[place]) 10/04/2014   Bilateral leg edema 08/01/2014   Elevated BP 08/01/2014   Rheumatoid arthritis involving multiple joints ([place]) 05/30/2014   Status post total replacement of right hip 04/28/2014   Long-term use of high-risk medication 11/11/2013   Symptomatic anemia 11/11/2013   Neuropathic pain 07/07/2013   Constipation due to pain medication 07/07/2013   Protein-calorie malnutrition, severe ([place]) 06/08/2013   Lung cancer, Right upper lobe 05/08/2013   Sciatica of right side 08/13/2011   Osteoporosis 03/07/2010   PARESTHESIA 03/07/2010   CT, CHEST, ABNORMAL 12/18/2008   ABNORMAL ECHOCARDIOGRAM 12/14/2008   SJOGREN'S SYNDROME 11/29/2008   HYPOGLYCEMIA 06/29/2006   RAYNAUD'S DISEASE 06/29/2006   Past Medical History:  Diagnosis Date   Arthralgia of multiple joints    followed by dr Gerilyn Nestle   Arthritis    Cardiomyopathy ([place])    Chronic constipation    Chronic inflammatory arthritis    rhemotolgist-  dr a. Gerilyn Nestle ([facility])   Dry eyes    eye drops used    GERD (gastroesophageal reflux disease)    H/O discoid lupus erythematosus    Hiatal hernia    History of colon polyps    Hypothyroidism    Iron deficiency anemia    LBBB (left bundle branch block) 2010   Mitchell's disease (erythromelalgia) [facility])    neurologist-  dr patel   Nocturia    Non-small cell cancer of right lung [facility]) surgeon-- dr gerhardt/  oncologist-  dr Julien Nordmann--- per lov notes no recurrence/   11-18-2017 per pt denies any symptoms   dx 2015--  Stage IIA (T2b,N0,M0) , +EGFR  mutation in exon 21, non-small cell adenocarcinoma right upper lobe---  s/p  Right upper lobectomy , right middley wedge resection  and node dissection---  no chemo or radiation therapy   OA (osteoarthritis)    hands   Osteoporosis    PONV (postoperative nausea and vomiting)    likes phenergan   Raynaud's phenomenon 1965   Renal insufficiency    Rheumatoid arthritis ([place])    Sciatica    Scoliosis    Sjogren's syndrome ([place])     Family History  Problem Relation Age of Onset   Coronary artery disease Father    Colon cancer Father    Diabetes Father    Cancer Father        colon   Other Mother 55       MVA   Healthy Sister    Healthy Brother    Healthy Daughter    Hypothyroidism Daughter    Other Brother  killed in war   Pneumonia Sister    Healthy Daughter    Esophageal cancer Neg Hx    Kidney disease Neg Hx    Liver disease Neg Hx     Past Surgical History:  Procedure Laterality Date   ANTERIOR HIP REVISION Right 11/27/2017   Procedure: RIGHT HIP ACETABULAR REVISION;  Surgeon: [name], MD;  [facility];  Service: Orthopedics;  Laterality: Right;   ANTERIOR HIP REVISION Right 01/24/2018   Procedure: OPEN REDUCTION OF DISLOCATED ANTERIOR HIP WITH REVISION OF LINER AND HIP BALL;  Surgeon: [name], MD;  [facility];  Service: Orthopedics;  Laterality: Right;   APPENDECTOMY  1950s   BIOPSY  04/14/2018   Procedure: BIOPSY;  Surgeon: Yetta Flock, MD;  Location: [place];  Service: Gastroenterology;;   BIOPSY  04/16/2018   Procedure: BIOPSY;  Surgeon: Irving Copas., MD;  Location: [place];  Service: Gastroenterology;;   CARDIOVASCULAR STRESS TEST  12/2008    mild fixed basal to mid septal perfusion defect felt likely due to artifact from LBBB, no ischemia, EF 58%   COLONOSCOPY     COLONOSCOPY WITH PROPOFOL N/A 04/16/2018   Procedure: COLONOSCOPY WITH PROPOFOL;  Surgeon: Irving Copas., MD;  Location: [place];  Service: Gastroenterology;  Laterality: N/A;   ESOPHAGOGASTRODUODENOSCOPY (EGD) WITH PROPOFOL N/A 04/14/2018    Procedure: ESOPHAGOGASTRODUODENOSCOPY (EGD) WITH PROPOFOL;  Surgeon: Yetta Flock, MD;  Location: [place];  Service: Gastroenterology;  Laterality: N/A;   FEMORAL-POPLITEAL BYPASS GRAFT Right 04/10/2018   Procedure: REPAIR RIGHT FEMORAL ARTERY PSEUDOANEURYSM, RETROPERITONEAL EXPOSURE OF ILIAC ARTERY, RIGHT POPLITEAL EMBOLECTOMY;  Surgeon: Angelia Mould, MD;  Location: [place];  Service: Vascular;  Laterality: Right;   FLEXIBLE SIGMOIDOSCOPY N/A 06/17/2019   Procedure: FLEXIBLE SIGMOIDOSCOPY;  Surgeon: Lavena Bullion, DO;  [facility];  Service: Gastroenterology;  Laterality: N/A;   HEMOSTASIS CLIP PLACEMENT  06/17/2019   Procedure: HEMOSTASIS CLIP PLACEMENT;  Surgeon: Lavena Bullion, DO;  [facility];  Service: Gastroenterology;;   LYMPH NODE DISSECTION Right 06/07/2013   Procedure: LYMPH NODE DISSECTION;  Surgeon: [name], MD;  Location: [place];  Service: Thoracic;  Laterality: Right;   PATCH ANGIOPLASTY Right 04/10/2018   Procedure: PATCH  ANGIOPLASTY OF RIGHT FEMORAL ARTERY USING BOVINE PATCH, PATCH ANGIOPLASTY OF RIGHT POPLITEAL ARTERY USING BOVINE PATCH;  Surgeon: Angelia Mould, MD;  Location: [place];  Service: Vascular;  Laterality: Right;   SCHLEROTHERAPY  06/17/2019   Procedure: Woodward Ku OF VARICES;  Surgeon: Lavena Bullion, DO;  [facility];  Service: Gastroenterology;;   [place]   "large incision from chest to up to shoulder, the nerves were tied together, for raynaud's   THORACOTOMY  06/07/2013   Procedure: MINI/LIMITED THORACOTOMY; right middle lobe wedge resection;  Surgeon: [name], MD;  Location: [place];  Service: Thoracic;;   TONSILLECTOMY  child   TOTAL ABDOMINAL HYSTERECTOMY  1980's    W/ BSO   TOTAL HIP ARTHROPLASTY Right 04/28/2014   Procedure: RIGHT TOTAL HIP ARTHROPLASTY ANTERIOR APPROACH;  Surgeon: [name], MD;  [facility];  Service:  Orthopedics;  Laterality: Right;   TRANSTHORACIC ECHOCARDIOGRAM  12/11/2008   ef 60-45%, grade 1 diastolic dysfunction/  mild LAE/  mild AR and MR/  trivial TR   VIDEO ASSISTED THORACOSCOPY (VATS)/WEDGE RESECTION Right 06/07/2013   Procedure: VIDEO ASSISTED THORACOSCOPY (VATS)/right upper lobectomy, On Q;  Surgeon: [name], MD;  [facility];  Service: Thoracic;  Laterality: Right;   VIDEO BRONCHOSCOPY N/A 06/07/2013   Procedure: VIDEO BRONCHOSCOPY;  Surgeon: [name], MD;  [facility];  Service: Thoracic;  Laterality: N/A;   VIDEO BRONCHOSCOPY WITH ENDOBRONCHIAL NAVIGATION N/A 05/04/2013   Procedure: VIDEO BRONCHOSCOPY WITH ENDOBRONCHIAL NAVIGATION;  Surgeon: [name], MD;  Location: [place];  Service: Thoracic;  Laterality: N/A;   Social History   Occupational History   Occupation: n/a  Tobacco Use   Smoking status: Never    Passive exposure: Past   Smokeless tobacco: Never  Vaping Use   Vaping Use: Never used  Substance and Sexual Activity   Alcohol use: Not Currently   Drug use: Never   Sexual activity: Not Currently    Birth control/protection: Surgical

## 2021-09-14 MED ORDER — CEPHALEXIN 500 MG PO CAPS: 500.0000 mg | ORAL_CAPSULE | Freq: Two times a day (BID) | ORAL | 0 refills | Status: DC

## 2021-09-16 NOTE — Progress Notes (Deleted)
Office Visit Note  Patient: [name]             Date of Birth: 1930-12-04           MRN: [id number]             PCP: Darreld Mclean, MD Referring: Darreld Mclean, MD Visit Date: 09/30/2021 Occupation: _0 @  Subjective:  No chief complaint on file.   History of Present Illness: [name] is a 88 y.o. female ***   Activities of Daily Living:  Patient reports morning stiffness for *** {minute/hour:19697}.   Patient {ACTIONS;DENIES/REPORTS:21021675::"Denies"} nocturnal pain.  Difficulty dressing/grooming: {ACTIONS;DENIES/REPORTS:21021675::"Denies"} Difficulty climbing stairs: {ACTIONS;DENIES/REPORTS:21021675::"Denies"} Difficulty getting out of chair: {ACTIONS;DENIES/REPORTS:21021675::"Denies"} Difficulty using hands for taps, buttons, cutlery, and/or writing: {ACTIONS;DENIES/REPORTS:21021675::"Denies"}  No Rheumatology ROS completed.   PMFS History:  Patient Active Problem List   Diagnosis Date Noted   Rectal pain 10/10/2020   Acute posthemorrhagic anemia    Rectal bleeding 06/17/2019   Grade II internal hemorrhoids    Rectal ulcer    Popliteal artery occlusion, right ([place]) 04/10/2018   HTN (hypertension) 04/10/2018   Femoral artery pseudo-aneurysm, 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/09/2018   Anemia of chronic disease 02/24/2018   Unstable right hip arthroplasty 01/24/2018   History of revision of total replacement of right hip joint 01/24/2018   Hypovolemic shock ([place])    Hyperkalemia    Hyponatremia    Failed total hip arthroplasty ([place]) 11/27/2017   Status post revision of total hip 11/27/2017   Elevated cholesterol 10/11/2015   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[place]) 10/04/2014   Bilateral leg edema 08/01/2014   Elevated BP 08/01/2014   Rheumatoid arthritis involving multiple joints ([place]) 05/30/2014   Status post total replacement of right hip 04/28/2014   Long-term use of high-risk medication 11/11/2013   Symptomatic anemia 11/11/2013   Neuropathic pain  07/07/2013   Constipation due to pain medication 07/07/2013   Protein-calorie malnutrition, severe ([place]) 06/08/2013   Lung cancer, Right upper lobe 05/08/2013   Sciatica of right side 08/13/2011   Osteoporosis 03/07/2010   PARESTHESIA 03/07/2010   CT, CHEST, ABNORMAL 12/18/2008   ABNORMAL ECHOCARDIOGRAM 12/14/2008   SJOGREN'S SYNDROME 11/29/2008   HYPOGLYCEMIA 06/29/2006   RAYNAUD'S DISEASE 06/29/2006    Past Medical History:  Diagnosis Date   Arthralgia of multiple joints    followed by dr Gerilyn Nestle   Arthritis    Cardiomyopathy ([place])    Chronic constipation    Chronic inflammatory arthritis    rhemotolgist-  dr a. Gerilyn Nestle ([facility])   Dry eyes    eye drops used    GERD (gastroesophageal reflux disease)    H/O discoid lupus erythematosus    Hiatal hernia    History of colon polyps    Hypothyroidism    Iron deficiency anemia    LBBB (left bundle branch block) 2010   Mitchell's disease (erythromelalgia) [facility])    neurologist-  dr patel   Nocturia    Non-small cell cancer of right lung [facility]) surgeon-- dr gerhardt/  oncologist-  dr Julien Nordmann--- per lov notes no recurrence/   11-18-2017 per pt denies any symptoms   dx 2015--  Stage IIA (T2b,N0,M0) , +EGFR  mutation in exon 21, non-small cell adenocarcinoma right upper lobe---  s/p  Right upper lobectomy , right middley wedge resection and node dissection---  no chemo or radiation therapy   OA (osteoarthritis)    hands   Osteoporosis    PONV (postoperative nausea and vomiting)    likes phenergan  Raynaud's phenomenon 1965   Renal insufficiency    Rheumatoid arthritis ([place])    Sciatica    Scoliosis    Sjogren's syndrome ([place])     Family History  Problem Relation Age of Onset   Coronary artery disease Father    Colon cancer Father    Diabetes Father    Cancer Father        colon   Other Mother 56       MVA   Healthy Sister    Healthy Brother    Healthy Daughter    Hypothyroidism Daughter    Other Brother         killed in war   Pneumonia Sister    Healthy Daughter    Esophageal cancer Neg Hx    Kidney disease Neg Hx    Liver disease Neg Hx    Past Surgical History:  Procedure Laterality Date   ANTERIOR HIP REVISION Right 11/27/2017   Procedure: RIGHT HIP ACETABULAR REVISION;  Surgeon: [name], MD;  [facility];  Service: Orthopedics;  Laterality: Right;   ANTERIOR HIP REVISION Right 01/24/2018   Procedure: OPEN REDUCTION OF DISLOCATED ANTERIOR HIP WITH REVISION OF LINER AND HIP BALL;  Surgeon: [name], MD;  [facility];  Service: Orthopedics;  Laterality: Right;   APPENDECTOMY  1950s   BIOPSY  04/14/2018   Procedure: BIOPSY;  Surgeon: Yetta Flock, MD;  Location: [place];  Service: Gastroenterology;;   BIOPSY  04/16/2018   Procedure: BIOPSY;  Surgeon: Irving Copas., MD;  Location: [place];  Service: Gastroenterology;;   CARDIOVASCULAR STRESS TEST  12/2008    mild fixed basal to mid septal perfusion defect felt likely due to artifact from LBBB, no ischemia, EF 58%   COLONOSCOPY     COLONOSCOPY WITH PROPOFOL N/A 04/16/2018   Procedure: COLONOSCOPY WITH PROPOFOL;  Surgeon: Irving Copas., MD;  Location: [place];  Service: Gastroenterology;  Laterality: N/A;   ESOPHAGOGASTRODUODENOSCOPY (EGD) WITH PROPOFOL N/A 04/14/2018   Procedure: ESOPHAGOGASTRODUODENOSCOPY (EGD) WITH PROPOFOL;  Surgeon: Yetta Flock, MD;  Location: [place];  Service: Gastroenterology;  Laterality: N/A;   FEMORAL-POPLITEAL BYPASS GRAFT Right 04/10/2018   Procedure: REPAIR RIGHT FEMORAL ARTERY PSEUDOANEURYSM, RETROPERITONEAL EXPOSURE OF ILIAC ARTERY, RIGHT POPLITEAL EMBOLECTOMY;  Surgeon: Angelia Mould, MD;  Location: [place];  Service: Vascular;  Laterality: Right;   FLEXIBLE SIGMOIDOSCOPY N/A 06/17/2019   Procedure: FLEXIBLE SIGMOIDOSCOPY;  Surgeon: Lavena Bullion, DO;  [facility];  Service: Gastroenterology;   Laterality: N/A;   HEMOSTASIS CLIP PLACEMENT  06/17/2019   Procedure: HEMOSTASIS CLIP PLACEMENT;  Surgeon: Lavena Bullion, DO;  [facility];  Service: Gastroenterology;;   LYMPH NODE DISSECTION Right 06/07/2013   Procedure: LYMPH NODE DISSECTION;  Surgeon: [name], MD;  Location: [place];  Service: Thoracic;  Laterality: Right;   PATCH ANGIOPLASTY Right 04/10/2018   Procedure: PATCH  ANGIOPLASTY OF RIGHT FEMORAL ARTERY USING BOVINE PATCH, PATCH ANGIOPLASTY OF RIGHT POPLITEAL ARTERY USING BOVINE PATCH;  Surgeon: Angelia Mould, MD;  Location: [place];  Service: Vascular;  Laterality: Right;   SCHLEROTHERAPY  06/17/2019   Procedure: Woodward Ku OF VARICES;  Surgeon: Lavena Bullion, DO;  [facility];  Service: Gastroenterology;;   [place]   "large incision from chest to up to shoulder, the nerves were tied together, for raynaud's   THORACOTOMY  06/07/2013   Procedure: MINI/LIMITED THORACOTOMY; right middle lobe wedge resection;  Surgeon: Lilia Argue  Servando Snare, MD;  Location: [place];  Service: Thoracic;;   TONSILLECTOMY  child   TOTAL ABDOMINAL HYSTERECTOMY  1980's    W/ BSO   TOTAL HIP ARTHROPLASTY Right 04/28/2014   Procedure: RIGHT TOTAL HIP ARTHROPLASTY ANTERIOR APPROACH;  Surgeon: [name], MD;  [facility];  Service: Orthopedics;  Laterality: Right;   TRANSTHORACIC ECHOCARDIOGRAM  12/11/2008   ef 76-81%, grade 1 diastolic dysfunction/  mild LAE/  mild AR and MR/  trivial TR   VIDEO ASSISTED THORACOSCOPY (VATS)/WEDGE RESECTION Right 06/07/2013   Procedure: VIDEO ASSISTED THORACOSCOPY (VATS)/right upper lobectomy, On Q;  Surgeon: [name], MD;  Location: [place];  Service: Thoracic;  Laterality: Right;   VIDEO BRONCHOSCOPY N/A 06/07/2013   Procedure: VIDEO BRONCHOSCOPY;  Surgeon: [name], MD;  Location: [place];  Service: Thoracic;  Laterality: N/A;   VIDEO BRONCHOSCOPY WITH ENDOBRONCHIAL NAVIGATION N/A  05/04/2013   Procedure: VIDEO BRONCHOSCOPY WITH ENDOBRONCHIAL NAVIGATION;  Surgeon: [name], MD;  [facility] [place];  Service: Thoracic;  Laterality: N/A;   Social History   Social History Narrative   Lives with husband, daughter and grandchild local.   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 History  Administered Date(s) Administered   Fluad Quad(high Dose 65+) 11/03/2018, 12/14/2019   Influenza Split 11/21/2011   Influenza Whole 11/29/2007, 11/29/2008, 11/29/2009   Influenza, High Dose Seasonal PF 10/30/2012, 01/02/2015, 11/13/2016, 11/12/2017   Influenza,inj,Quad PF,6+ Mos 11/22/2013, 11/14/2015   Influenza-Unspecified 11/13/2016, 11/12/2017, 12/14/2020   PFIZER(Purple Top)SARS-COV-2 Vaccination 04/12/2019, 05/03/2019, 01/20/2020, 12/14/2020   Pneumococcal Conjugate-13 05/22/2015   Pneumococcal Polysaccharide-23 06/13/2013   Tdap 08/17/2017   Zoster, Live 01/27/2014     Objective: Vital Signs: There were no vitals taken for this visit.   Physical Exam   Musculoskeletal Exam: ***  CDAI Exam: CDAI Score: -- Patient Global: --; Provider Global: -- Swollen: --; Tender: -- Joint Exam 09/30/2021   No joint exam has been documented for this visit   There is currently no information documented on the homunculus. Go to the Rheumatology activity and complete the homunculus joint exam.  Investigation: No additional findings.  Imaging: XR HIP UNILAT W OR W/O PELVIS 1V RIGHT  Result Date: 09/12/2021 An AP pelvis and lateral right hip shows a right total hip arthroplasty with no complicating features or acute findings.   Recent Labs: Lab Results  Component Value Date   WBC 4.7 08/26/2021   HGB 10.9 (L) 08/26/2021   PLT 198.0 08/26/2021   NA 129 (L) 08/26/2021   K 5.1 08/26/2021   CL 93 (L) 08/26/2021   CO2 30 08/26/2021   GLUCOSE 75 08/26/2021   BUN 45 (H) 08/26/2021   CREATININE 1.34 (H) 08/26/2021   BILITOT 0.4  08/14/2021   ALKPHOS 38 08/14/2021   AST 23 08/14/2021   ALT 12 08/14/2021   PROT 6.6 08/14/2021   ALBUMIN 3.9 08/14/2021   CALCIUM 9.4 08/26/2021   GFRAA 60 05/21/2020   QFTBGOLDPLUS Negative 07/10/2021    Speciality Comments: MTX, Arava-side effects Orencia-07/21 Prolia - 04/22/19, 11/09/19, 06/06/20,12/20/20   Fu q 3 months  Procedures:  No procedures performed Allergies: Amlodipine, Prochlorperazine edisylate, Aspirin, Cymbalta [duloxetine hcl], and Pamelor [nortriptyline hcl]   Assessment / Plan:     Visit Diagnoses: No diagnosis found.  Orders: No orders of the defined types were placed in this encounter.  No orders of the defined types were placed in this encounter.   Face-to-face time spent  with patient was *** minutes. Greater than 50% of time was spent in counseling and coordination of care.  Follow-Up Instructions: No follow-ups on file.   Earnestine Mealing, CMA  Note - This record has been created using Editor, commissioning.  Chart creation errors have been sought, but may not always  have been located. Such creation errors do not reflect on  the standard of medical care.

## 2021-09-17 ENCOUNTER — Ambulatory Visit (HOSPITAL_BASED_OUTPATIENT_CLINIC_OR_DEPARTMENT_OTHER)
Admission: RE | Admit: 2021-09-17 | Discharge: 2021-09-17 | Disposition: A | Discharge status: Home / Self Care | Payer: Medicare PPO | Source: Ambulatory Visit | Attending: Family Medicine | Admitting: Family Medicine

## 2021-09-17 DIAGNOSIS — R110 Nausea: Secondary | ICD-10-CM | POA: Insufficient documentation

## 2021-09-17 DIAGNOSIS — R109 Unspecified abdominal pain: Secondary | ICD-10-CM | POA: Diagnosis not present

## 2021-09-18 ENCOUNTER — Encounter: Payer: Self-pay | Admitting: Family Medicine

## 2021-09-18 ENCOUNTER — Ambulatory Visit: Payer: Medicare PPO | Admitting: Physician Assistant

## 2021-09-18 DIAGNOSIS — R300 Dysuria: Secondary | ICD-10-CM

## 2021-09-24 ENCOUNTER — Other Ambulatory Visit: Payer: Self-pay | Admitting: Family Medicine

## 2021-09-26 ENCOUNTER — Inpatient Hospital Stay: Payer: Medicare PPO

## 2021-09-27 ENCOUNTER — Encounter (HOSPITAL_COMMUNITY): Payer: Self-pay

## 2021-09-27 ENCOUNTER — Other Ambulatory Visit: Payer: Self-pay

## 2021-09-27 ENCOUNTER — Inpatient Hospital Stay: Payer: Medicare PPO | Attending: Internal Medicine

## 2021-09-27 ENCOUNTER — Other Ambulatory Visit: Payer: Medicare PPO

## 2021-09-27 ENCOUNTER — Ambulatory Visit (HOSPITAL_COMMUNITY)
Admission: RE | Admit: 2021-09-27 | Discharge: 2021-09-27 | Disposition: A | Discharge status: Home / Self Care | Payer: Medicare PPO | Source: Ambulatory Visit | Attending: Internal Medicine | Admitting: Internal Medicine

## 2021-09-27 DIAGNOSIS — D649 Anemia, unspecified: Secondary | ICD-10-CM | POA: Insufficient documentation

## 2021-09-27 DIAGNOSIS — C3490 Malignant neoplasm of unspecified part of unspecified bronchus or lung: Secondary | ICD-10-CM

## 2021-09-27 DIAGNOSIS — R300 Dysuria: Secondary | ICD-10-CM | POA: Diagnosis not present

## 2021-09-27 DIAGNOSIS — C3492 Malignant neoplasm of unspecified part of left bronchus or lung: Secondary | ICD-10-CM | POA: Insufficient documentation

## 2021-09-27 DIAGNOSIS — R911 Solitary pulmonary nodule: Secondary | ICD-10-CM | POA: Diagnosis not present

## 2021-09-27 DIAGNOSIS — C3491 Malignant neoplasm of unspecified part of right bronchus or lung: Secondary | ICD-10-CM | POA: Insufficient documentation

## 2021-09-27 LAB — CBC WITH DIFFERENTIAL (CANCER CENTER ONLY)
Abs Immature Granulocytes: 0.02 10*3/uL (ref 0.00–0.07)
Basophils Absolute: 0 10*3/uL (ref 0.0–0.1)
Basophils Relative: 1 %
Eosinophils Absolute: 0 10*3/uL (ref 0.0–0.5)
Eosinophils Relative: 1 %
HCT: 31.8 % — ABNORMAL LOW (ref 36.0–46.0)
Hemoglobin: 10.5 g/dL — ABNORMAL LOW (ref 12.0–15.0)
Immature Granulocytes: 0 %
Lymphocytes Relative: 18 %
Lymphs Abs: 0.9 10*3/uL (ref 0.7–4.0)
MCH: 29.9 pg (ref 26.0–34.0)
MCHC: 33 g/dL (ref 30.0–36.0)
MCV: 90.6 fL (ref 80.0–100.0)
Monocytes Absolute: 0.5 10*3/uL (ref 0.1–1.0)
Monocytes Relative: 9 %
Neutro Abs: 3.8 10*3/uL (ref 1.7–7.7)
Neutrophils Relative %: 71 %
Platelet Count: 176 10*3/uL (ref 150–400)
RBC: 3.51 MIL/uL — ABNORMAL LOW (ref 3.87–5.11)
RDW: 13.4 % (ref 11.5–15.5)
WBC Count: 5.3 10*3/uL (ref 4.0–10.5)
nRBC: 0 % (ref 0.0–0.2)

## 2021-09-27 LAB — CMP (CANCER CENTER ONLY)
ALT: 16 U/L (ref 0–44)
AST: 27 U/L (ref 15–41)
Albumin: 4.2 g/dL (ref 3.5–5.0)
Alkaline Phosphatase: 35 U/L — ABNORMAL LOW (ref 38–126)
Anion gap: 3 — ABNORMAL LOW (ref 5–15)
BUN: 26 mg/dL — ABNORMAL HIGH (ref 8–23)
CO2: 27 mmol/L (ref 22–32)
Calcium: 9.2 mg/dL (ref 8.9–10.3)
Chloride: 101 mmol/L (ref 98–111)
Creatinine: 1.14 mg/dL — ABNORMAL HIGH (ref 0.44–1.00)
GFR, Estimated: 46 mL/min — ABNORMAL LOW (ref >60)
Glucose, Bld: 87 mg/dL (ref 70–99)
Potassium: 5.1 mmol/L (ref 3.5–5.1)
Sodium: 131 mmol/L — ABNORMAL LOW (ref 135–145)
Total Bilirubin: 0.4 mg/dL (ref 0.3–1.2)
Total Protein: 7 g/dL (ref 6.5–8.1)

## 2021-09-27 MED ORDER — IOHEXOL 300 MG/ML  SOLN
75.0000 mL | Freq: Once | INTRAMUSCULAR | Status: AC | PRN
  Administered 2021-09-27: 75 mL via INTRAVENOUS

## 2021-09-28 LAB — URINE CULTURE
MICRO NUMBER:: 13767725
SPECIMEN QUALITY:: ADEQUATE

## 2021-09-29 ENCOUNTER — Encounter: Payer: Self-pay | Admitting: Family Medicine

## 2021-09-30 ENCOUNTER — Other Ambulatory Visit: Payer: Self-pay

## 2021-09-30 ENCOUNTER — Encounter: Payer: Self-pay | Admitting: Internal Medicine

## 2021-09-30 ENCOUNTER — Inpatient Hospital Stay (HOSPITAL_BASED_OUTPATIENT_CLINIC_OR_DEPARTMENT_OTHER): Payer: Medicare PPO | Admitting: Internal Medicine

## 2021-09-30 ENCOUNTER — Ambulatory Visit: Payer: Medicare PPO | Admitting: Physician Assistant

## 2021-09-30 VITALS — BP 155/70 | HR 77 | Temp 98.0°F | Resp 15 | Ht 60.0 in | Wt 107.6 lb

## 2021-09-30 DIAGNOSIS — D649 Anemia, unspecified: Secondary | ICD-10-CM | POA: Diagnosis not present

## 2021-09-30 DIAGNOSIS — L659 Nonscarring hair loss, unspecified: Secondary | ICD-10-CM

## 2021-09-30 DIAGNOSIS — Z79899 Other long term (current) drug therapy: Secondary | ICD-10-CM

## 2021-09-30 DIAGNOSIS — C3410 Malignant neoplasm of upper lobe, unspecified bronchus or lung: Secondary | ICD-10-CM | POA: Diagnosis not present

## 2021-09-30 DIAGNOSIS — I7300 Raynaud's syndrome without gangrene: Secondary | ICD-10-CM

## 2021-09-30 DIAGNOSIS — C3411 Malignant neoplasm of upper lobe, right bronchus or lung: Secondary | ICD-10-CM

## 2021-09-30 DIAGNOSIS — D638 Anemia in other chronic diseases classified elsewhere: Secondary | ICD-10-CM

## 2021-09-30 DIAGNOSIS — E270 Other adrenocortical overactivity: Secondary | ICD-10-CM

## 2021-09-30 DIAGNOSIS — C3491 Malignant neoplasm of unspecified part of right bronchus or lung: Secondary | ICD-10-CM | POA: Diagnosis not present

## 2021-09-30 DIAGNOSIS — M3500 Sicca syndrome, unspecified: Secondary | ICD-10-CM

## 2021-09-30 DIAGNOSIS — Z96641 Presence of right artificial hip joint: Secondary | ICD-10-CM

## 2021-09-30 DIAGNOSIS — C3492 Malignant neoplasm of unspecified part of left bronchus or lung: Secondary | ICD-10-CM | POA: Diagnosis not present

## 2021-09-30 DIAGNOSIS — M810 Age-related osteoporosis without current pathological fracture: Secondary | ICD-10-CM

## 2021-09-30 DIAGNOSIS — R29890 Loss of height: Secondary | ICD-10-CM

## 2021-09-30 DIAGNOSIS — E7800 Pure hypercholesterolemia, unspecified: Secondary | ICD-10-CM

## 2021-09-30 DIAGNOSIS — I10 Essential (primary) hypertension: Secondary | ICD-10-CM

## 2021-09-30 DIAGNOSIS — M0609 Rheumatoid arthritis without rheumatoid factor, multiple sites: Secondary | ICD-10-CM

## 2021-09-30 DIAGNOSIS — M4125 Other idiopathic scoliosis, thoracolumbar region: Secondary | ICD-10-CM

## 2021-09-30 NOTE — Progress Notes (Signed)
[place] [phone]   [phone]  OFFICE PROGRESS NOTE  Copland, Gay Filler, MD [address] [place] [address]  DIAGNOSIS: Stage IIA (T2b, N0, M0) non-small cell lung cancer, adenocarcinoma with positive EGFR mutation in exon 21 ([id number]) presented with right upper lobe lung mass diagnosed in March of 2015.  PRIOR THERAPY: Status post right upper lobectomy with wedge resection of the right middle lobe under the care of Dr. Servando Snare on 06/07/2013  CURRENT THERAPY: Observation.  INTERVAL HISTORY: [name] 88 y.o. female returns to the clinic today for follow-up visit accompanied by her daughter.  The patient is feeling fine today with no concerning complaints except for shortness of breath and cough at nighttime.  She denied having any chest pain or hemoptysis.  She has no nausea, vomiting, diarrhea or constipation.  She denied having any headache or visual changes.  She denied having any recent weight loss or night sweats.  She continues on her infusion with Orencia under the care of Dr. Estanislado Pandy.  She is here today for evaluation with repeat CT scan of the chest for restaging of her disease.   MEDICAL HISTORY: Past Medical History:  Diagnosis Date   Arthralgia of multiple joints    followed by dr Gerilyn Nestle   Arthritis    Cardiomyopathy ([place])    Chronic constipation    Chronic inflammatory arthritis    rhemotolgist-  dr a. Gerilyn Nestle ([facility])   Dry eyes    eye drops used    GERD (gastroesophageal reflux disease)    H/O discoid lupus erythematosus    Hiatal hernia    History of colon polyps    Hypothyroidism    Iron deficiency anemia    LBBB (left bundle branch block) 2010   Mitchell's disease (erythromelalgia) [facility])    neurologist-  dr patel   Nocturia    Non-small cell cancer of right lung [facility]) surgeon-- dr gerhardt/  oncologist-  dr Julien Nordmann--- per lov notes no recurrence/   11-18-2017 per pt  denies any symptoms   dx 2015--  Stage IIA (T2b,N0,M0) , +EGFR  mutation in exon 21, non-small cell adenocarcinoma right upper lobe---  s/p  Right upper lobectomy , right middley wedge resection and node dissection---  no chemo or radiation therapy   OA (osteoarthritis)    hands   Osteoporosis    PONV (postoperative nausea and vomiting)    likes phenergan   Raynaud's phenomenon 1965   Renal insufficiency    Rheumatoid arthritis ([place])    Sciatica    Scoliosis    Sjogren's syndrome ([place])     ALLERGIES:  is allergic to amlodipine, prochlorperazine edisylate, aspirin, cymbalta [duloxetine hcl], and pamelor [nortriptyline hcl].  MEDICATIONS:  Current Outpatient Medications  Medication Sig Dispense Refill   Abatacept (ORENCIA IV) Inject into the vein every 28 (twenty-eight) days.     Acetaminophen (TYLENOL EXTRA STRENGTH PO) Take 1-2 tablets by mouth every 6 (six) hours as needed (pain).      Biotin 1000 MCG tablet Take 1,000 mcg by mouth daily.     Calcium Carbonate-Vitamin D 500-125 MG-UNIT TABS Take 1 tablet by mouth daily.     carvedilol (COREG) 3.125 MG tablet Take 1 tablet (3.125 mg total) by mouth 2 (two) times daily with a meal. 180 tablet 3   cephALEXin (KEFLEX) 500 MG capsule Take 1 capsule (500 mg total) by mouth 2 (two) times daily. 10 capsule 0  denosumab (PROLIA) 60 MG/ML SOSY injection Inject 60 mg into the skin every 6 (six) months.     docusate sodium (COLACE) 100 MG capsule Take 100 mg by mouth 2 (two) times daily.     furosemide (LASIX) 20 MG tablet Take 1 tablet (20 mg total) by mouth every other day. 45 tablet 3   gabapentin (NEURONTIN) 300 MG capsule TAKE 1 CAPSULE BY MOUTH IN THE MORNING, 1 IN THE AFTERNOON, AND 2 AT BEDTIME 360 capsule 1   Glucosamine-Chondroit-Vit C-Mn (GLUCOSAMINE CHONDR 1500 COMPLX PO) Take 1 capsule by mouth daily.     HYDROcodone-acetaminophen (NORCO/VICODIN) 5-325 MG tablet Take 0.5-1 tablets by mouth every 8 (eight) hours as needed. 30 tablet  0   levothyroxine (SYNTHROID) 75 MCG tablet TAKE 1 TABLET BY MOUTH DAILY BEFORE BREAKFAST. 90 tablet 0   methocarbamol (ROBAXIN) 500 MG tablet TAKE 1 TABLET BY MOUTH EVERY 6 HOURS AS NEEDED FOR MUSCLE SPASMS. 40 tablet 0   Multiple Vitamin (MULTIVITAMIN) tablet Take 1 tablet by mouth daily.     omeprazole (PRILOSEC) 20 MG capsule TAKE 1 CAPSULE BY MOUTH EVERY DAY 90 capsule 3   Probiotic Product (PROBIOTIC PO) Take 1 capsule by mouth daily.      traMADol (ULTRAM) 50 MG tablet Take 0.5-1 tablets (25-50 mg total) by mouth every 8 (eight) hours as needed. Do not combine with other pain medication 60 tablet 1   VITAMIN D PO Take by mouth daily.     No current facility-administered medications for this visit.    SURGICAL HISTORY:  Past Surgical History:  Procedure Laterality Date   ANTERIOR HIP REVISION Right 11/27/2017   Procedure: RIGHT HIP ACETABULAR REVISION;  Surgeon: [name], MD;  Location: [facility];  Service: Orthopedics;  Laterality: Right;   ANTERIOR HIP REVISION Right 01/24/2018   Procedure: OPEN REDUCTION OF DISLOCATED ANTERIOR HIP WITH REVISION OF LINER AND HIP BALL;  Surgeon: [name], MD;  Location: [facility];  Service: Orthopedics;  Laterality: Right;   APPENDECTOMY  1950s   BIOPSY  04/14/2018   Procedure: BIOPSY;  Surgeon: Yetta Flock, MD;  Location: [place];  Service: Gastroenterology;;   BIOPSY  04/16/2018   Procedure: BIOPSY;  Surgeon: Irving Copas., MD;  Location: [place];  Service: Gastroenterology;;   CARDIOVASCULAR STRESS TEST  12/2008    mild fixed basal to mid septal perfusion defect felt likely due to artifact from LBBB, no ischemia, EF 58%   COLONOSCOPY     COLONOSCOPY WITH PROPOFOL N/A 04/16/2018   Procedure: COLONOSCOPY WITH PROPOFOL;  Surgeon: Irving Copas., MD;  Location: [place];  Service: Gastroenterology;  Laterality: N/A;   ESOPHAGOGASTRODUODENOSCOPY (EGD) WITH PROPOFOL N/A 04/14/2018    Procedure: ESOPHAGOGASTRODUODENOSCOPY (EGD) WITH PROPOFOL;  Surgeon: Yetta Flock, MD;  Location: [place];  Service: Gastroenterology;  Laterality: N/A;   FEMORAL-POPLITEAL BYPASS GRAFT Right 04/10/2018   Procedure: REPAIR RIGHT FEMORAL ARTERY PSEUDOANEURYSM, RETROPERITONEAL EXPOSURE OF ILIAC ARTERY, RIGHT POPLITEAL EMBOLECTOMY;  Surgeon: Angelia Mould, MD;  Location: [place];  Service: Vascular;  Laterality: Right;   FLEXIBLE SIGMOIDOSCOPY N/A 06/17/2019   Procedure: FLEXIBLE SIGMOIDOSCOPY;  Surgeon: Lavena Bullion, DO;  [facility];  Service: Gastroenterology;  Laterality: N/A;   HEMOSTASIS CLIP PLACEMENT  06/17/2019   Procedure: HEMOSTASIS CLIP PLACEMENT;  Surgeon: Lavena Bullion, DO;  [facility];  Service: Gastroenterology;;   LYMPH NODE DISSECTION Right 06/07/2013   Procedure: LYMPH NODE DISSECTION;  Surgeon: [name], MD;  Location:  [facility];  Service: Thoracic;  Laterality: Right;   PATCH ANGIOPLASTY Right 04/10/2018   Procedure: PATCH  ANGIOPLASTY OF RIGHT FEMORAL ARTERY USING BOVINE PATCH, PATCH ANGIOPLASTY OF RIGHT POPLITEAL ARTERY USING BOVINE PATCH;  Surgeon: Angelia Mould, MD;  Location: [place];  Service: Vascular;  Laterality: Right;   SCHLEROTHERAPY  06/17/2019   Procedure: [place] Ku OF VARICES;  Surgeon: Lavena Bullion, DO;  [facility];  Service: Gastroenterology;;   [place]   "large incision from chest to up to shoulder, the nerves were tied together, for raynaud's   THORACOTOMY  06/07/2013   Procedure: MINI/LIMITED THORACOTOMY; right middle lobe wedge resection;  Surgeon: [name], MD;  Location: [place];  Service: Thoracic;;   TONSILLECTOMY  child   TOTAL ABDOMINAL HYSTERECTOMY  1980's    W/ BSO   TOTAL HIP ARTHROPLASTY Right 04/28/2014   Procedure: RIGHT TOTAL HIP ARTHROPLASTY ANTERIOR APPROACH;  Surgeon: [name], MD;  [facility];  Service:  Orthopedics;  Laterality: Right;   TRANSTHORACIC ECHOCARDIOGRAM  12/11/2008   ef 42-39%, grade 1 diastolic dysfunction/  mild LAE/  mild AR and MR/  trivial TR   VIDEO ASSISTED THORACOSCOPY (VATS)/WEDGE RESECTION Right 06/07/2013   Procedure: VIDEO ASSISTED THORACOSCOPY (VATS)/right upper lobectomy, On Q;  Surgeon: [name], MD;  Location: [place];  Service: Thoracic;  Laterality: Right;   VIDEO BRONCHOSCOPY N/A 06/07/2013   Procedure: VIDEO BRONCHOSCOPY;  Surgeon: [name], MD;  Location: [place];  Service: Thoracic;  Laterality: N/A;   VIDEO BRONCHOSCOPY WITH ENDOBRONCHIAL NAVIGATION N/A 05/04/2013   Procedure: VIDEO BRONCHOSCOPY WITH ENDOBRONCHIAL NAVIGATION;  Surgeon: [name], MD;  Location: [place];  Service: Thoracic;  Laterality: N/A;    REVIEW OF SYSTEMS:  Constitutional: positive for fatigue Eyes: negative Ears, nose, mouth, throat, and face: negative Respiratory: positive for cough and dyspnea on exertion Cardiovascular: negative Gastrointestinal: negative Genitourinary:negative Integument/breast: negative Hematologic/lymphatic: negative Musculoskeletal:negative Neurological: negative Behavioral/Psych: negative Endocrine: negative Allergic/Immunologic: negative   PHYSICAL EXAMINATION: General appearance: alert, cooperative, and no distress Head: Normocephalic, without obvious abnormality, atraumatic Neck: no adenopathy, no JVD, supple, symmetrical, trachea midline, and thyroid not enlarged, symmetric, no tenderness/mass/nodules Lymph nodes: Cervical, supraclavicular, and axillary nodes normal. Resp: clear to auscultation bilaterally Back: symmetric, no curvature. ROM normal. No CVA tenderness. Cardio: regular rate and rhythm, S1, S2 normal, no murmur, click, rub or gallop GI: soft, non-tender; bowel sounds normal; no masses,  no organomegaly Extremities: extremities normal, atraumatic, no cyanosis or edema Neurologic: Alert and oriented X 3, normal  strength and tone. Normal symmetric reflexes. Normal coordination and gait  ECOG PERFORMANCE STATUS: 1 - Symptomatic but completely ambulatory  Blood pressure (!) 155/70, pulse 77, temperature 98 F (36.7 C), temperature source Oral, resp. rate 15, height 5' (1.524 m), weight 107 lb 9.6 oz (48.8 kg), SpO2 99 %.  LABORATORY DATA: Lab Results  Component Value Date   WBC 5.3 09/27/2021   HGB 10.5 (L) 09/27/2021   HCT 31.8 (L) 09/27/2021   MCV 90.6 09/27/2021   PLT 176 09/27/2021      Chemistry      Component Value Date/Time   NA 131 (L) 09/27/2021 1510   NA 130 (L) 04/26/2021 0000   NA 130 (L) 08/21/2016 1125   K 5.1 09/27/2021 1510   K 4.8 08/21/2016 1125   CL 101 09/27/2021 1510   CO2 27 09/27/2021 1510   CO2 26 08/21/2016 1125   BUN 26 (H) 09/27/2021 1510  BUN 27 04/26/2021 0000   BUN 25.6 08/21/2016 1125   CREATININE 1.14 (H) 09/27/2021 1510   CREATININE 0.98 (H) 05/21/2020 1341   CREATININE 1.2 (H) 08/21/2016 1125      Component Value Date/Time   CALCIUM 9.2 09/27/2021 1510   CALCIUM 10.0 08/21/2016 1125   ALKPHOS 35 (L) 09/27/2021 1510   ALKPHOS 54 08/21/2016 1125   AST 27 09/27/2021 1510   AST 27 08/21/2016 1125   ALT 16 09/27/2021 1510   ALT 13 08/21/2016 1125   BILITOT 0.4 09/27/2021 1510   BILITOT 0.31 08/21/2016 1125       RADIOGRAPHIC STUDIES: CT Chest W Contrast  Result Date: 09/30/2021 CLINICAL DATA:  Right upper lobe non-small cell lung cancer status post right upper lobectomy and right middle lobe wedge resection in 2015. Restaging. * Tracking Code: BO * EXAM: CT CHEST WITH CONTRAST TECHNIQUE: Multidetector CT imaging of the chest was performed during intravenous contrast administration. RADIATION DOSE REDUCTION: This exam was performed according to the departmental dose-optimization program which includes automated exposure control, adjustment of the mA and/or kV according to patient size and/or use of iterative reconstruction technique. CONTRAST:   43mL OMNIPAQUE IOHEXOL 300 MG/ML  SOLN COMPARISON:  03/27/2021 chest CT. FINDINGS: Cardiovascular: Normal heart size. No significant pericardial effusion/thickening. Atherosclerotic nonaneurysmal thoracic aorta. Normal caliber pulmonary arteries. No central pulmonary emboli. Mediastinum/Nodes: No discrete thyroid nodules. Unremarkable esophagus. No pathologically enlarged axillary, mediastinal or hilar lymph nodes. Lungs/Pleura: No pneumothorax. No pleural effusion. Status post right upper lobectomy and right middle lobe wedge resection. Numerous (greater than 20) pulmonary nodules scattered throughout both lungs of variable density. Representative subsolid 1.6 x 0.8 cm right lower lobe nodule (series 6/image 55), increased from 1.2 x 0.6 cm on 03/27/2021 CT. Representative 0.9 cm solid posterior left lower lobe nodule (series 6/image 109), increased from 0.7 cm. Representative irregular solid 0.9 cm peripheral left upper lobe nodule (series 6/image 56), slightly increased from 0.8 cm. Upper abdomen: Moderate hiatal hernia. Musculoskeletal:  No aggressive appearing focal osseous lesions. IMPRESSION: 1. Continued interval growth of numerous solid and subsolid pulmonary nodules scattered throughout both lungs, most compatible with progressive metastatic disease. 2. No thoracic adenopathy. 3. Moderate hiatal hernia. 4. Aortic Atherosclerosis (ICD10-I70.0). Electronically Signed   By: Ilona Sorrel M.D.   On: 09/30/2021 08:12   US Abdomen Limited RUQ (LIVER/GB)  Result Date: 09/17/2021 CLINICAL DATA:  Nausea and abdominal pain. EXAM: ULTRASOUND ABDOMEN LIMITED RIGHT UPPER QUADRANT COMPARISON:  Chest CT 03/27/2021 FINDINGS: Gallbladder: No gallstones or wall thickening visualized. No sonographic Murphy sign noted by sonographer. Common bile duct: Diameter: 3 mm Liver: Increased echogenicity. No focal lesion. Portal vein is patent on color Doppler imaging with normal direction of blood flow towards the liver. Other:  None. IMPRESSION: Increased hepatic parenchymal echogenicity suggestive of steatosis. No cholelithiasis or sonographic evidence for acute cholecystitis. Electronically Signed   By: Lovey Newcomer M.D.   On: 09/17/2021 14:51   XR HIP UNILAT W OR W/O PELVIS 1V RIGHT  Result Date: 09/12/2021 An AP pelvis and lateral right hip shows a right total hip arthroplasty with no complicating features or acute findings.   ASSESSMENT AND PLAN:  This is a very pleasant 88 years old white female with stage IIA non-small cell lung cancer, adenocarcinoma with positive EGFR mutation in exon 21 status post right upper lobectomy as well as wedge resection of the right middle lobe in March 2015 and the patient declined adjuvant systemic chemotherapy. The patient has been on observation  for several years. She had CT scan of the chest performed recently.  I personally and independently reviewed the scan images and discussed the results with the patient and her daughter and showed them the images. Definitely her scan showed further increase in the size of numerous solid and subsolid pulmonary nodules throughout both lungs.  This is compatible with disease progression. I discussed with the patient her treatment options and gave her the option of starting treatment with Tagrisso 80 mg p.o. daily versus continuous observation and monitoring. I discussed with the patient the adverse effect of Tagrisso including but not limited to skin rash, diarrhea, inflammation of the lung, kidney, liver or cardiac dysfunction. The patient would like to think about her options before making a decision. If she decided against the treatment, I will see her back for follow-up visit in around 4 months with repeat CT scan of the chest. For the anemia, we will continue to monitor her hemoglobin and hematocrit closely.  The patient will continue with the oral iron supplements. She was advised to call immediately if she has any other concerning symptoms  in the interval.  The patient voices understanding of current disease status and treatment options and is in agreement with the current care plan. All questions were answered. The patient knows to call the clinic with any problems, questions or concerns. We can certainly see the patient much sooner if necessary.  Disclaimer: This note was dictated with voice recognition software. Similar sounding words can inadvertently be transcribed and may not be corrected upon review.

## 2021-10-01 ENCOUNTER — Encounter: Payer: Self-pay | Admitting: Family Medicine

## 2021-10-01 ENCOUNTER — Encounter: Payer: Self-pay | Admitting: Internal Medicine

## 2021-10-07 ENCOUNTER — Ambulatory Visit: Payer: Medicare PPO | Admitting: Internal Medicine

## 2021-10-07 ENCOUNTER — Other Ambulatory Visit: Payer: Medicare PPO

## 2021-10-09 ENCOUNTER — Ambulatory Visit (HOSPITAL_COMMUNITY)
Admission: RE | Admit: 2021-10-09 | Discharge: 2021-10-09 | Disposition: A | Discharge status: Home / Self Care | Payer: Medicare PPO | Source: Ambulatory Visit | Attending: Rheumatology | Admitting: Rheumatology

## 2021-10-09 ENCOUNTER — Other Ambulatory Visit: Payer: Self-pay | Admitting: Pharmacist

## 2021-10-09 VITALS — BP 149/68 | HR 64 | Temp 97.3°F | Resp 16 | Wt 103.0 lb

## 2021-10-09 DIAGNOSIS — M0609 Rheumatoid arthritis without rheumatoid factor, multiple sites: Secondary | ICD-10-CM

## 2021-10-09 DIAGNOSIS — Z79899 Other long term (current) drug therapy: Secondary | ICD-10-CM | POA: Insufficient documentation

## 2021-10-09 DIAGNOSIS — M069 Rheumatoid arthritis, unspecified: Secondary | ICD-10-CM | POA: Diagnosis not present

## 2021-10-09 LAB — CBC WITH DIFFERENTIAL/PLATELET
Abs Immature Granulocytes: 0.01 10*3/uL (ref 0.00–0.07)
Basophils Absolute: 0 10*3/uL (ref 0.0–0.1)
Basophils Relative: 1 %
Eosinophils Absolute: 0 10*3/uL (ref 0.0–0.5)
Eosinophils Relative: 1 %
HCT: 34.3 % — ABNORMAL LOW (ref 36.0–46.0)
Hemoglobin: 10.9 g/dL — ABNORMAL LOW (ref 12.0–15.0)
Immature Granulocytes: 0 %
Lymphocytes Relative: 21 %
Lymphs Abs: 0.8 10*3/uL (ref 0.7–4.0)
MCH: 29.9 pg (ref 26.0–34.0)
MCHC: 31.8 g/dL (ref 30.0–36.0)
MCV: 94.2 fL (ref 80.0–100.0)
Monocytes Absolute: 0.5 10*3/uL (ref 0.1–1.0)
Monocytes Relative: 13 %
Neutro Abs: 2.5 10*3/uL (ref 1.7–7.7)
Neutrophils Relative %: 64 %
Platelets: 200 10*3/uL (ref 150–400)
RBC: 3.64 MIL/uL — ABNORMAL LOW (ref 3.87–5.11)
RDW: 13.3 % (ref 11.5–15.5)
WBC: 3.9 10*3/uL — ABNORMAL LOW (ref 4.0–10.5)
nRBC: 0 % (ref 0.0–0.2)

## 2021-10-09 LAB — COMPREHENSIVE METABOLIC PANEL
ALT: 13 U/L (ref 0–44)
AST: 30 U/L (ref 15–41)
Albumin: 3.8 g/dL (ref 3.5–5.0)
Alkaline Phosphatase: 39 U/L (ref 38–126)
Anion gap: 5 (ref 5–15)
BUN: 26 mg/dL — ABNORMAL HIGH (ref 8–23)
CO2: 28 mmol/L (ref 22–32)
Calcium: 9.3 mg/dL (ref 8.9–10.3)
Chloride: 100 mmol/L (ref 98–111)
Creatinine, Ser: 1.05 mg/dL — ABNORMAL HIGH (ref 0.44–1.00)
GFR, Estimated: 51 mL/min — ABNORMAL LOW (ref >60)
Glucose, Bld: 84 mg/dL (ref 70–99)
Potassium: 5.2 mmol/L — ABNORMAL HIGH (ref 3.5–5.1)
Sodium: 133 mmol/L — ABNORMAL LOW (ref 135–145)
Total Bilirubin: 0.3 mg/dL (ref 0.3–1.2)
Total Protein: 6.6 g/dL (ref 6.5–8.1)

## 2021-10-09 MED ORDER — ACETAMINOPHEN 325 MG PO TABS: 650.0000 mg | ORAL_TABLET | ORAL | Status: DC

## 2021-10-09 MED ORDER — SODIUM CHLORIDE 0.9 % IV SOLN
500.0000 mg | INTRAVENOUS | Status: DC
  Administered 2021-10-09: 500 mg via INTRAVENOUS
  Filled 2021-10-09: qty 20

## 2021-10-09 MED ORDER — DIPHENHYDRAMINE HCL 25 MG PO CAPS: 25.0000 mg | ORAL_CAPSULE | ORAL | Status: DC

## 2021-10-09 NOTE — Progress Notes (Signed)
Creatinine is high and stable.  Sodium is low, potassium is elevated.  White cell count is low due to immunosuppression.  Hemoglobin is low and is stable.  We will continue to monitor labs.

## 2021-10-09 NOTE — Progress Notes (Signed)
Next infusion scheduled for Orencia IV on 11/06/21 and due for updated orders. Diagnosis: RA  Dose: 500mg  every 28 days (appropriate based on last recorded weight of 46.7kg)  Last Clinic Visit: 06/26/21 Next Clinic Visit: 10/16/21  Last infusion: 10/09/21  Labs: 10/09/21 CBC and CMP - Creatinine is high and stable.  Sodium is low, potassium is elevated.  White cell count is low due to immunosuppression.  Hemoglobin is low and is stable.  TB Gold: negative on 07/10/21   Orders placed for Orencia IV x 3 doses along with premedication of acetaminophen and diphenhydramine to be administered 30 minutes before medication infusion.  Standing CBC with diff/platelet and CMP with GFR orders placed to be drawn every 2 months.  Next TB gold due 07/11/2022  Knox Saliva, PharmD, MPH, BCPS, CPP Clinical Pharmacist (Rheumatology and Pulmonology)

## 2021-10-14 NOTE — Progress Notes (Unsigned)
 Office Visit Note  Patient: [name]             Date of Birth: 06/11/1930           MRN: [id number]             PCP: Copland, Jessica C, MD Referring: Copland, Jessica C, MD Visit Date: 10/16/2021 Occupation: @GUAROCC@  Subjective:  Pain in multiple joints   History of Present Illness: [name] is a 88 y.o. female with history of seronegative rheumatoid arthritis and osteoporosis. She remains on Orencia 500 mg IV infusions every 28 days. Her last infusion was on 10/09/21.  He continues to experience intermittent pain and stiffness in both hands and both feet.  She has CMC joint discomfort bilaterally.  She denies any increased joint swelling at this time.  She states that she has symptoms of neuropathy intermittently in her feet which is typically alleviated by topical agents and the use of gabapentin.  She continues to have some discomfort in her right hip and uses a cane to assist with ambulation.  Patient reports that she had recent bilateral shoulder joint injections performed by Dr. Blackman on 09/12/2021 which have provided temporary relief. She remains on prolia 60 mg sq injections every 6 months for management of osteoporosis.  She is due to update DEXA.   She remains under the care of Dr. Mohamed for observation of lung cancer.  The patient is currently weighing the risks and benefits of starting on Tagrisso.  She is concerned about possible side effects.     Activities of Daily Living:  Patient reports morning stiffness for several hours.   Patient Reports nocturnal pain.  Difficulty dressing/grooming: Reports Difficulty climbing stairs: Reports Difficulty getting out of chair: Denies Difficulty using hands for taps, buttons, cutlery, and/or writing: Reports  Review of Systems  Constitutional:  Negative for fatigue.  HENT:  Negative for mouth sores and mouth dryness.   Eyes:  Positive for dryness.  Respiratory:  Negative for shortness of breath.    Cardiovascular:  Negative for chest pain and palpitations.  Gastrointestinal:  Negative for blood in stool, constipation and diarrhea.  Endocrine: Positive for increased urination.  Genitourinary:  Positive for painful urination. Negative for involuntary urination.  Musculoskeletal:  Positive for joint pain, joint pain, myalgias, muscle weakness, morning stiffness, muscle tenderness and myalgias. Negative for gait problem and joint swelling.  Skin:  Positive for color change. Negative for rash, hair loss and sensitivity to sunlight.  Allergic/Immunologic: Positive for susceptible to infections.  Neurological:  Negative for dizziness and headaches.  Hematological:  Negative for swollen glands.  Psychiatric/Behavioral:  Negative for depressed mood and sleep disturbance. The patient is not nervous/anxious.     PMFS History:  Patient Active Problem List   Diagnosis Date Noted   Rectal pain 10/10/2020   Acute posthemorrhagic anemia    Rectal bleeding 06/17/2019   Grade II internal hemorrhoids    Rectal ulcer    Popliteal artery occlusion, 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/10/2018   HTN (hypertension) 04/10/2018   Femoral artery pseudo-aneurysm, 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/09/2018   Anemia of chronic disease 02/24/2018   Unstable right hip arthroplasty 01/24/2018   History of revision of total replacement of right hip joint 01/24/2018   Hypovolemic shock (HCC)    Hyperkalemia    Hyponatremia    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   Status post revision of total hip 11/27/2017   Elevated cholesterol 10/11/2015   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HCC) 10/04/2014     Bilateral leg edema 08/01/2014   Elevated BP 08/01/2014   Rheumatoid arthritis involving multiple joints ([place]) 05/30/2014   Status post total replacement of right hip 04/28/2014   Long-term use of high-risk medication 11/11/2013   Symptomatic anemia 11/11/2013   Neuropathic pain 07/07/2013   Constipation due to pain medication 07/07/2013    Protein-calorie malnutrition, severe ([place]) 06/08/2013   Lung cancer, Right upper lobe 05/08/2013   Sciatica of right side 08/13/2011   Osteoporosis 03/07/2010   PARESTHESIA 03/07/2010   CT, CHEST, ABNORMAL 12/18/2008   ABNORMAL ECHOCARDIOGRAM 12/14/2008   SJOGREN'S SYNDROME 11/29/2008   HYPOGLYCEMIA 06/29/2006   RAYNAUD'S DISEASE 06/29/2006    Past Medical History:  Diagnosis Date   Arthralgia of multiple joints    followed by dr Gerilyn Nestle   Arthritis    Cardiomyopathy ([place])    Chronic constipation    Chronic inflammatory arthritis    rhemotolgist-  dr a. Gerilyn Nestle ([facility])   Dry eyes    eye drops used    GERD (gastroesophageal reflux disease)    H/O discoid lupus erythematosus    Hiatal hernia    History of colon polyps    Hypothyroidism    Iron deficiency anemia    LBBB (left bundle branch block) 2010   Mitchell's disease (erythromelalgia) [facility])    neurologist-  dr patel   Nocturia    Non-small cell cancer of right lung [facility]) surgeon-- dr gerhardt/  oncologist-  dr Julien Nordmann--- per lov notes no recurrence/   11-18-2017 per pt denies any symptoms   dx 2015--  Stage IIA (T2b,N0,M0) , +EGFR  mutation in exon 21, non-small cell adenocarcinoma right upper lobe---  s/p  Right upper lobectomy , right middley wedge resection and node dissection---  no chemo or radiation therapy   OA (osteoarthritis)    hands   Osteoporosis    PONV (postoperative nausea and vomiting)    likes phenergan   Raynaud's phenomenon 1965   Renal insufficiency    Rheumatoid arthritis ([place])    Sciatica    Scoliosis    Sjogren's syndrome ([place])     Family History  Problem Relation Age of Onset   Coronary artery disease Father    Colon cancer Father    Diabetes Father    Cancer Father        colon   Other Mother 86       MVA   Healthy Sister    Healthy Brother    Healthy Daughter    Hypothyroidism Daughter    Other Brother        killed in war   Pneumonia Sister    Healthy Daughter     Esophageal cancer Neg Hx    Kidney disease Neg Hx    Liver disease Neg Hx    Past Surgical History:  Procedure Laterality Date   ANTERIOR HIP REVISION Right 11/27/2017   Procedure: RIGHT HIP ACETABULAR REVISION;  Surgeon: [name], MD;  [facility]: [facility];  Service: Orthopedics;  Laterality: Right;   ANTERIOR HIP REVISION Right 01/24/2018   Procedure: OPEN REDUCTION OF DISLOCATED ANTERIOR HIP WITH REVISION OF LINER AND HIP BALL;  Surgeon: [name], MD;  [facility]: [facility];  Service: Orthopedics;  Laterality: Right;   APPENDECTOMY  1950s   BIOPSY  04/14/2018   Procedure: BIOPSY;  Surgeon: Yetta Flock, MD;  Location: [place];  Service: Gastroenterology;;   BIOPSY  04/16/2018   Procedure: BIOPSY;  Surgeon: Irving Copas., MD;  [facility];  Service: Gastroenterology;;   CARDIOVASCULAR STRESS TEST  12/2008    mild fixed basal to mid septal perfusion defect felt likely due to artifact from LBBB, no ischemia, EF 58%   COLONOSCOPY     COLONOSCOPY WITH PROPOFOL N/A 04/16/2018   Procedure: COLONOSCOPY WITH PROPOFOL;  Surgeon: Mansouraty, Gabriel Jr., MD;  [facility];  Service: Gastroenterology;  Laterality: N/A;   ESOPHAGOGASTRODUODENOSCOPY (EGD) WITH PROPOFOL N/A 04/14/2018   Procedure: ESOPHAGOGASTRODUODENOSCOPY (EGD) WITH PROPOFOL;  Surgeon: Armbruster, Steven P, MD;  [facility];  Service: Gastroenterology;  Laterality: N/A;   FEMORAL-POPLITEAL BYPASS GRAFT Right 04/10/2018   Procedure: REPAIR RIGHT FEMORAL ARTERY PSEUDOANEURYSM, RETROPERITONEAL EXPOSURE OF ILIAC ARTERY, RIGHT POPLITEAL EMBOLECTOMY;  Surgeon: Dickson, Christopher S, MD;  [facility];  Service: Vascular;  Laterality: Right;   FLEXIBLE SIGMOIDOSCOPY N/A 06/17/2019   Procedure: FLEXIBLE SIGMOIDOSCOPY;  Surgeon: Cirigliano, Vito V, DO;  [facility];  Service: Gastroenterology;  Laterality: N/A;   HEMOSTASIS CLIP PLACEMENT  06/17/2019    Procedure: HEMOSTASIS CLIP PLACEMENT;  Surgeon: Cirigliano, Vito V, DO;  [facility];  Service: Gastroenterology;;   LYMPH NODE DISSECTION Right 06/07/2013   Procedure: LYMPH NODE DISSECTION;  Surgeon: Edward B Gerhardt, MD;  [facility];  Service: Thoracic;  Laterality: Right;   PATCH ANGIOPLASTY Right 04/10/2018   Procedure: PATCH  ANGIOPLASTY OF RIGHT FEMORAL ARTERY USING BOVINE PATCH, PATCH ANGIOPLASTY OF RIGHT POPLITEAL ARTERY USING BOVINE PATCH;  Surgeon: Dickson, Christopher S, MD;  [facility];  Service: Vascular;  Laterality: Right;   SCHLEROTHERAPY  06/17/2019   Procedure: SCHLEROTHERAPY OF VARICES;  Surgeon: Cirigliano, Vito V, DO;  [facility];  Service: Gastroenterology;;   THORACIC SYMPATHETECTOMY  1965   "large incision from chest to up to shoulder, the nerves were tied together, for raynaud's   THORACOTOMY  06/07/2013   Procedure: MINI/LIMITED THORACOTOMY; right middle lobe wedge resection;  Surgeon: Edward B Gerhardt, MD;  [facility];  Service: Thoracic;;   TONSILLECTOMY  child   TOTAL ABDOMINAL HYSTERECTOMY  1980's    W/ BSO   TOTAL HIP ARTHROPLASTY Right 04/28/2014   Procedure: RIGHT TOTAL HIP ARTHROPLASTY ANTERIOR APPROACH;  Surgeon: Christopher Y Blackman, MD;  [facility];  Service: Orthopedics;  Laterality: Right;   TRANSTHORACIC ECHOCARDIOGRAM  12/11/2008   ef 45-50%, grade 1 diastolic dysfunction/  mild LAE/  mild AR and MR/  trivial TR   VIDEO ASSISTED THORACOSCOPY (VATS)/WEDGE RESECTION Right 06/07/2013   Procedure: VIDEO ASSISTED THORACOSCOPY (VATS)/right upper lobectomy, On Q;  Surgeon: Edward B Gerhardt, MD;  [facility];  Service: Thoracic;  Laterality: Right;   VIDEO BRONCHOSCOPY N/A 06/07/2013   Procedure: VIDEO BRONCHOSCOPY;  Surgeon: Edward B Gerhardt, MD;  [facility];  Service: Thoracic;  Laterality: N/A;   VIDEO BRONCHOSCOPY WITH ENDOBRONCHIAL NAVIGATION N/A 05/04/2013   Procedure: VIDEO BRONCHOSCOPY WITH  ENDOBRONCHIAL NAVIGATION;  Surgeon: Edward B Gerhardt, MD;  [facility];  Service: Thoracic;  Laterality: N/A;   Social History   Social History Narrative   Lives with husband, daughter and grandchild local.   Highest level of education:  masters in education admin and technology   Immunization History  Administered Date(s) Administered   Fluad Quad(high Dose 65+) 11/03/2018, 12/14/2019   Influenza Split 11/21/2011   Influenza Whole 11/29/2007, 11/29/2008, 11/29/2009   Influenza, High Dose Seasonal PF 10/30/2012, 01/02/2015, 11/13/2016, 11/12/2017   Influenza,inj,Quad PF,6+ Mos 11/22/2013, 11/14/2015   Influenza-Unspecified 11/13/2016, 11/12/2017, 12/14/2020   PFIZER(Purple Top)SARS-COV-2 Vaccination 04/12/2019,   05/03/2019, 01/20/2020, 12/14/2020   Pneumococcal Conjugate-13 05/22/2015   Pneumococcal Polysaccharide-23 06/13/2013   Tdap 08/17/2017   Zoster, Live 01/27/2014     Objective: Vital Signs: BP 134/73 (BP Location: Left Arm, Patient Position: Sitting, Cuff Size: Normal)   Pulse 86   Resp 18   Ht 5' (1.524 m)   Wt 107 lb 12.8 oz (48.9 kg)   BMI 21.05 kg/m    Physical Exam Vitals and nursing note reviewed.  Constitutional:      Appearance: She is well-developed.  HENT:     Head: Normocephalic and atraumatic.  Eyes:     Conjunctiva/sclera: Conjunctivae normal.  Cardiovascular:     Rate and Rhythm: Normal rate and regular rhythm.     Heart sounds: Normal heart sounds.  Pulmonary:     Effort: Pulmonary effort is normal.     Breath sounds: Normal breath sounds.  Abdominal:     General: Bowel sounds are normal.     Palpations: Abdomen is soft.  Musculoskeletal:     Cervical back: Normal range of motion.  Skin:    General: Skin is warm and dry.     Capillary Refill: Capillary refill takes less than 2 seconds.  Neurological:     Mental Status: She is alert and oriented to person, place, and time.  Psychiatric:        Behavior: Behavior normal.       Musculoskeletal Exam: C-spine is limited range of motion without rotation.  Thoracic kyphosis noted.  Shoulder abduction to about 70 degrees bilaterally.  Tenderness over both shoulder joints.  Elbow joints have good range of motion with no tenderness.  Limited range of motion of both wrist joints.  CMC joint prominence and thickening noted bilaterally.  Synovial thickening over MCP joints.  Tenderness over bilateral second MCPs and right third MCP.  PIP and DIP thickening consistent with osteoarthritis of both hands.  Subluxation of several DIP joints of the right hand noted.  Right hip replacement has limited range of motion.  Left hip has slightly limited range of motion.  Knee joints have good range of motion with no warmth or effusion.  Ankle joints have good range of motion with no joint tenderness.  Pedal edema noted bilaterally.  Overcrowding of toes noted.  Tenderness over the left first MTP joint.  CDAI Exam: CDAI Score: -- Patient Global: --; Provider Global: -- Swollen: --; Tender: -- Joint Exam 10/16/2021   No joint exam has been documented for this visit   There is currently no information documented on the homunculus. Go to the Rheumatology activity and complete the homunculus joint exam.  Investigation: No additional findings.  Imaging: CT Chest W Contrast  Result Date: 09/30/2021 CLINICAL DATA:  Right upper lobe non-small cell lung cancer status post right upper lobectomy and right middle lobe wedge resection in 2015. Restaging. * Tracking Code: BO * EXAM: CT CHEST WITH CONTRAST TECHNIQUE: Multidetector CT imaging of the chest was performed during intravenous contrast administration. RADIATION DOSE REDUCTION: This exam was performed according to the departmental dose-optimization program which includes automated exposure control, adjustment of the mA and/or kV according to patient size and/or use of iterative reconstruction technique. CONTRAST:  75mL OMNIPAQUE IOHEXOL 300 MG/ML   SOLN COMPARISON:  03/27/2021 chest CT. FINDINGS: Cardiovascular: Normal heart size. No significant pericardial effusion/thickening. Atherosclerotic nonaneurysmal thoracic aorta. Normal caliber pulmonary arteries. No central pulmonary emboli. Mediastinum/Nodes: No discrete thyroid nodules. Unremarkable esophagus. No pathologically enlarged axillary, mediastinal or hilar lymph nodes. Lungs/Pleura: No   pneumothorax. No pleural effusion. Status post right upper lobectomy and right middle lobe wedge resection. Numerous (greater than 20) pulmonary nodules scattered throughout both lungs of variable density. Representative subsolid 1.6 x 0.8 cm right lower lobe nodule (series 6/image 55), increased from 1.2 x 0.6 cm on 03/27/2021 CT. Representative 0.9 cm solid posterior left lower lobe nodule (series 6/image 109), increased from 0.7 cm. Representative irregular solid 0.9 cm peripheral left upper lobe nodule (series 6/image 56), slightly increased from 0.8 cm. Upper abdomen: Moderate hiatal hernia. Musculoskeletal:  No aggressive appearing focal osseous lesions. IMPRESSION: 1. Continued interval growth of numerous solid and subsolid pulmonary nodules scattered throughout both lungs, most compatible with progressive metastatic disease. 2. No thoracic adenopathy. 3. Moderate hiatal hernia. 4. Aortic Atherosclerosis (ICD10-I70.0). Electronically Signed   By: Jason A Poff M.D.   On: 09/30/2021 08:12   US Abdomen Limited RUQ (LIVER/GB)  Result Date: 09/17/2021 CLINICAL DATA:  Nausea and abdominal pain. EXAM: ULTRASOUND ABDOMEN LIMITED RIGHT UPPER QUADRANT COMPARISON:  Chest CT 03/27/2021 FINDINGS: Gallbladder: No gallstones or wall thickening visualized. No sonographic Murphy sign noted by sonographer. Common bile duct: Diameter: 3 mm Liver: Increased echogenicity. No focal lesion. Portal vein is patent on color Doppler imaging with normal direction of blood flow towards the liver. Other: None. IMPRESSION: Increased hepatic  parenchymal echogenicity suggestive of steatosis. No cholelithiasis or sonographic evidence for acute cholecystitis. Electronically Signed   By: Drew  Davis M.D.   On: 09/17/2021 14:51    Recent Labs: Lab Results  Component Value Date   WBC 3.9 (L) 10/09/2021   HGB 10.9 (L) 10/09/2021   PLT 200 10/09/2021   NA 133 (L) 10/09/2021   K 5.2 (H) 10/09/2021   CL 100 10/09/2021   CO2 28 10/09/2021   GLUCOSE 84 10/09/2021   BUN 26 (H) 10/09/2021   CREATININE 1.05 (H) 10/09/2021   BILITOT 0.3 10/09/2021   ALKPHOS 39 10/09/2021   AST 30 10/09/2021   ALT 13 10/09/2021   PROT 6.6 10/09/2021   ALBUMIN 3.8 10/09/2021   CALCIUM 9.3 10/09/2021   GFRAA 60 05/21/2020   QFTBGOLDPLUS Negative 07/10/2021    Speciality Comments: MTX, Arava-side effects Orencia-07/21 Prolia - 04/22/19, 11/09/19, 06/06/20,12/20/20   Fu q 3 months  Procedures:  No procedures performed Allergies: Amlodipine, Prochlorperazine edisylate, Aspirin, Cymbalta [duloxetine hcl], and Pamelor [nortriptyline hcl]   Assessment / Plan:     Visit Diagnoses: Rheumatoid arthritis of multiple sites with negative rheumatoid factor (HCC) -  RF negative, anti-CCP negative: Patient has no active synovitis at this time.  She continues to have chronic pain in both shoulders, both hands, and both feet.  She had bilateral subacromial bursa cortisone injections on 09/12/2021 performed by Dr. Blackman.  She has synovial thickening of MCP joints but no active synovitis noted.  She remains on Orencia 500 mg IV infusions every 28 days.  Her most recent infusion was scheduled on 10/09/2021.  She has been tolerating Orencia without any side effects.  She has had several bouts of a UTI requiring antibiotics prescribed by Dr. Copland.  She is currently having dysuria and urgency so a UA and urine culture were ordered today for further evaluation.  Discussed the importance of always holding Orencia if she develops signs or symptoms of an infection and to  resume once the infection is completely cleared. Patient remains under the management of Dr. Mohamed due to history of non-small cell lung cancer.  Her most recent office visit was on 09/30/2021 at which time   there was discussion of adding Tagrisso but the patient is currently weighing the risks and benefits of initiating therapy.  She is concerned about possible side effects interfering with her quality of life.  If she does not decide to initiate treatment the recommendation is to follow-up with a CT of the chest in 4 months for further evaluation.  Patient requested that I discussed with Dr. Estanislado Pandy and Knox Saliva, [facility] today to get their opinion about the new medication.  Dr. Estanislado Pandy and [facility] both recommend following the recommendations of Dr. Julien Nordmann. She will continue to follow up every 3 months.  She will remain on IV Orencia for now.   High risk medication use - Orencia 500 mg IV infusions every 28 days.  Her most recent infusion was scheduled on 10/09/21.  (previously on [place] and Methotrexate-intolerance). CBC and CMP updated on 10/09/21.  TB gold negative on 07/10/21. She has been experiencing more frequent UTIs.  She is currently experiencing symptoms of dysuria and urinary frequency.  UA and urine culture were ordered today for further evaluation.   Discussed the importance of holding orencia if she develops signs or symptoms of an infection and to resume once the infection has completely cleared.    Sicca syndrome [facility]): She continues to have chronic sicca symptoms.  Raynaud's disease without gangrene: Experiences intermittent symptoms of Raynaud's.  No digital ulcerations or signs of gangrene were noted.  Status post total replacement of right hip - 01/2018 revision by Dr. Ninfa Linden.  Chronic pain.  Limited range of motion on examination.  X-rays of the right hip were updated on 09/12/2021 by Dr. Ninfa Linden. She is using a cane to assist with ambulation.  Other idiopathic scoliosis,  thoracolumbar region: Thoracic kyphosis noted.  Thoracolumbar scoliosis noted.  Age-related osteoporosis without current pathological fracture - DEXA 01/28/2019: The BMD measured at Femur Neck is 0.636 g/cm2 with a T-score of -2.9.  Patient due to update bone density.  Her last prolia injection was administered on 07/10/2021. No recent falls or fractures.  Using a cane to assist with ambulation. She is taking calcium and vitamin D supplement daily.  Height loss - Thoracic kyphosis noted.    Dysuria -Patient is experiencing dysuria and urinary frequency.  She took Azo last night.  A UA and urine culture will be checked today.  Plan: Urinalysis, Routine w reflex microscopic, Urine Culture  Urinary frequency -She is currently experiencing dysuria and urgency.  UA and urine culture were checked today.  Plan: Urinalysis, Routine w reflex microscopic, Urine Culture  Malignant neoplasm of upper lobe of right lung ([place]) - s/p lobectomy 2015.  She is following up closely with Dr. Julien Nordmann.  Reviewed office visit note from 09/30/2021. Patient is currently weighing the risks and benefits of initiating Tagrisso.  She is concerned about possible side effects interfering with her quality of life.  Other medical conditions are listed as follows:   Anemia of chronic disease  Elevated cholesterol  Essential hypertension - She is followed by cardiologist.  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[place])  Orders: Orders Placed This Encounter  Procedures   Urine Culture   Urinalysis, Routine w reflex microscopic   No orders of the defined types were placed in this encounter.    Follow-Up Instructions: Return in 3 months (on 01/16/2022) for Rheumatoid arthritis, Osteoporosis.   Ofilia Neas, PA-C  Note - This record has been created using Dragon software.  Chart creation errors have been sought, but may not always  have been located. Such  creation errors do not reflect on  the standard of medical care.  

## 2021-10-15 ENCOUNTER — Other Ambulatory Visit: Payer: Self-pay | Admitting: Family Medicine

## 2021-10-15 DIAGNOSIS — R300 Dysuria: Secondary | ICD-10-CM

## 2021-10-16 ENCOUNTER — Ambulatory Visit: Payer: Medicare PPO | Attending: Physician Assistant | Admitting: Physician Assistant

## 2021-10-16 ENCOUNTER — Encounter: Payer: Self-pay | Admitting: Physician Assistant

## 2021-10-16 VITALS — BP 134/73 | HR 86 | Resp 18 | Ht 60.0 in | Wt 107.8 lb

## 2021-10-16 DIAGNOSIS — M3500 Sicca syndrome, unspecified: Secondary | ICD-10-CM

## 2021-10-16 DIAGNOSIS — Z96641 Presence of right artificial hip joint: Secondary | ICD-10-CM

## 2021-10-16 DIAGNOSIS — M4125 Other idiopathic scoliosis, thoracolumbar region: Secondary | ICD-10-CM

## 2021-10-16 DIAGNOSIS — I10 Essential (primary) hypertension: Secondary | ICD-10-CM

## 2021-10-16 DIAGNOSIS — E7800 Pure hypercholesterolemia, unspecified: Secondary | ICD-10-CM

## 2021-10-16 DIAGNOSIS — R350 Frequency of micturition: Secondary | ICD-10-CM | POA: Diagnosis not present

## 2021-10-16 DIAGNOSIS — I7300 Raynaud's syndrome without gangrene: Secondary | ICD-10-CM | POA: Diagnosis not present

## 2021-10-16 DIAGNOSIS — R29890 Loss of height: Secondary | ICD-10-CM

## 2021-10-16 DIAGNOSIS — M810 Age-related osteoporosis without current pathological fracture: Secondary | ICD-10-CM

## 2021-10-16 DIAGNOSIS — R300 Dysuria: Secondary | ICD-10-CM

## 2021-10-16 DIAGNOSIS — L659 Nonscarring hair loss, unspecified: Secondary | ICD-10-CM

## 2021-10-16 DIAGNOSIS — Z79899 Other long term (current) drug therapy: Secondary | ICD-10-CM

## 2021-10-16 DIAGNOSIS — E270 Other adrenocortical overactivity: Secondary | ICD-10-CM

## 2021-10-16 DIAGNOSIS — M0609 Rheumatoid arthritis without rheumatoid factor, multiple sites: Secondary | ICD-10-CM

## 2021-10-16 DIAGNOSIS — D638 Anemia in other chronic diseases classified elsewhere: Secondary | ICD-10-CM

## 2021-10-16 DIAGNOSIS — C3411 Malignant neoplasm of upper lobe, right bronchus or lung: Secondary | ICD-10-CM

## 2021-10-17 ENCOUNTER — Telehealth: Payer: Self-pay | Admitting: Pharmacist

## 2021-10-17 ENCOUNTER — Other Ambulatory Visit: Payer: Self-pay | Admitting: Family Medicine

## 2021-10-17 MED ORDER — CEPHALEXIN 500 MG PO CAPS: 500.0000 mg | ORAL_CAPSULE | Freq: Two times a day (BID) | ORAL | 0 refills | Status: DC

## 2021-10-17 NOTE — Telephone Encounter (Signed)
Patient had OV with Hazel Sams, PA-C yesterday and wanted information about if a new oncology medication, Tagrisso would affect her autoimmune disease. There is no direct interaction with her Orencia infusions and Prolia injections.  ATC patient to review. Home phone rang. No VM box set up  Per chart review, until patient decides to start treatment, rx for Tagrisso will not be sent and pharmacist will not reach out. I've sent a staff message to oral chemo pharmacist to see if she would be willing to reach out to patient for specific questions about Tagrisso.  Knox Saliva, PharmD, MPH, BCPS, CPP Clinical Pharmacist (Rheumatology and Pulmonology)

## 2021-10-17 NOTE — Telephone Encounter (Signed)
Oral Chemotherapy Pharmacist Encounter   Received staff message from [facility], PharmD, MPH, BCPS, CPP in the Rheumatology/Pulmonology clinic regarding Ms. Sawtelle having questions regarding possible treatment with Tagrisso that was previously discussed at patient's last oncology office visit on 09/30/21.  Called and spoke with Ms. Mauceri. Specifically, she was concerned about Tagrisso interacting with her current medications and her current medications effecting the Tagrisso. I discussed that on review of her medication list there were no drug-drug interactions between [place] and her current medications.  We additionally discussed common side effects of Tagrisso, which include, but are not limited to, diarrhea, dry skin, skin rash, change in nails, changes in electrolytes. Also discussed the rare side effects of possibility of Qtc prolongation (we discussed baseline EKG would be obtained to monitor this) as well as ILD/pneumonitis.   Patient was appreciative of our discussion and stated she is still talking with her other physicians as well before making her final decision on if she would like to proceed with forward with Tagrisso. She stated she will call the office once she has made a decision.   Leron Croak, PharmD, BCPS, BCOP Hematology/Oncology Clinical Pharmacist [facility] and [place] [phone] 10/17/2021 1:26 PM

## 2021-10-17 NOTE — Telephone Encounter (Signed)
I called Adalind to let her know that I discussed the possibility of her initiating Tagrisso with both Dr. Estanislado Pandy and Knox Saliva, [facility].  Devki was extremely helpful and reached out to to Leron Croak, [facility] from the hem/onc department.  Leron Croak was able to call the patient and to review indications, contraindications, potential side effects with the patient today in detail.  Most of the patient's questions and concerns were addressed-including most common side effects of diarrhea and rash.  She plans on further discussing with Dr. Lorelei Pont on Tuesday at her upcoming appointment.  She is still undecided if she plans on initiating therapy.  She will notify us once she has made her decision. Hazel Sams, PA-C

## 2021-10-19 LAB — URINALYSIS, ROUTINE W REFLEX MICROSCOPIC
Bilirubin Urine: NEGATIVE
Glucose, UA: NEGATIVE
Ketones, ur: NEGATIVE
Nitrite: POSITIVE — AB
Specific Gravity, Urine: 1.022 (ref 1.001–1.035)
Squamous Epithelial / HPF: NONE SEEN /HPF (ref <=5)
WBC, UA: >=60 /HPF — AB (ref 0–5)
pH: 7.5 (ref 5.0–8.0)

## 2021-10-19 LAB — MICROSCOPIC MESSAGE

## 2021-10-19 LAB — URINE CULTURE
MICRO NUMBER:: 13853430
SPECIMEN QUALITY:: ADEQUATE

## 2021-10-20 NOTE — Progress Notes (Unsigned)
[place] at [facility] [address] [place], [place] [address] [phone]  Date:  10/23/2021   Name:  [name]   DOB:  [birth year]   MRN:  [id number]  PCP:  Darreld Mclean, MD    Chief Complaint: No chief complaint on file.   History of Present Illness:  [name] is a 88 y.o. very pleasant female patient who presents with the following:  Here today for follow-up Last seen by myself in July - :history of hypertension, lung cancer, Sjogren's syndrome, osteoporosis, complications from total hip replacement, hypothyroidism, discoid lupus, rheumatoid arthritis and complications from hip replacement   At her last visit we rechecked blood for worsening of mild anemia  Her hematologist was not concerned, felt anemia of chronic disease   Seen by her hematologist on 8/30-  Orencia every 30 days  Patient Active Problem List   Diagnosis Date Noted   Rectal pain 10/10/2020   Acute posthemorrhagic anemia    Rectal bleeding 06/17/2019   Grade II internal hemorrhoids    Rectal ulcer    Popliteal artery occlusion, right ([place]) 04/10/2018   HTN (hypertension) 04/10/2018   Femoral artery pseudo-aneurysm, right ([place]) 04/09/2018   Anemia of chronic disease 02/24/2018   Unstable right hip arthroplasty 01/24/2018   History of revision of total replacement of right hip joint 01/24/2018   Hypovolemic shock ([place])    Hyperkalemia    Hyponatremia    Failed total hip arthroplasty ([place]) 11/27/2017   Status post revision of total hip 11/27/2017   Elevated cholesterol 10/11/2015   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[place]) 10/04/2014   Bilateral leg edema 08/01/2014   Elevated BP 08/01/2014   Rheumatoid arthritis involving multiple joints ([place]) 05/30/2014   Status post total replacement of right hip 04/28/2014   Long-term use of high-risk medication 11/11/2013   Symptomatic anemia 11/11/2013   Neuropathic pain 07/07/2013   Constipation  due to pain medication 07/07/2013   Protein-calorie malnutrition, severe ([place]) 06/08/2013   Lung cancer, Right upper lobe 05/08/2013   Sciatica of right side 08/13/2011   Osteoporosis 03/07/2010   PARESTHESIA 03/07/2010   CT, CHEST, ABNORMAL 12/18/2008   ABNORMAL ECHOCARDIOGRAM 12/14/2008   SJOGREN'S SYNDROME 11/29/2008   HYPOGLYCEMIA 06/29/2006   RAYNAUD'S DISEASE 06/29/2006    Past Medical History:  Diagnosis Date   Arthralgia of multiple joints    followed by dr Gerilyn Nestle   Arthritis    Cardiomyopathy ([place])    Chronic constipation    Chronic inflammatory arthritis    rhemotolgist-  dr a. Gerilyn Nestle ([facility])   Dry eyes    eye drops used    GERD (gastroesophageal reflux disease)    H/O discoid lupus erythematosus    Hiatal hernia    History of colon polyps    Hypothyroidism    Iron deficiency anemia    LBBB (left bundle branch block) 2010   Mitchell's disease (erythromelalgia) [facility])    neurologist-  dr patel   Nocturia    Non-small cell cancer of right lung [facility]) surgeon-- dr gerhardt/  oncologist-  dr Julien Nordmann--- per lov notes no recurrence/   11-18-2017 per pt denies any symptoms   dx 2015--  Stage IIA (T2b,N0,M0) , +EGFR  mutation in exon 21, non-small cell adenocarcinoma right upper lobe---  s/p  Right upper lobectomy , right middley wedge resection and node dissection---  no chemo or radiation therapy   OA (osteoarthritis)    hands  Osteoporosis    PONV (postoperative nausea and vomiting)    likes phenergan   Raynaud's phenomenon 1965   Renal insufficiency    Rheumatoid arthritis ([place])    Sciatica    Scoliosis    Sjogren's syndrome [facility])     Past Surgical History:  Procedure Laterality Date   ANTERIOR HIP REVISION Right 11/27/2017   Procedure: RIGHT HIP ACETABULAR REVISION;  Surgeon: [name], MD;  Location: [facility];  Service: Orthopedics;  Laterality: Right;   ANTERIOR HIP REVISION Right 01/24/2018   Procedure: OPEN REDUCTION OF  DISLOCATED ANTERIOR HIP WITH REVISION OF LINER AND HIP BALL;  Surgeon: [name], MD;  Location: [facility];  Service: Orthopedics;  Laterality: Right;   APPENDECTOMY  1950s   BIOPSY  04/14/2018   Procedure: BIOPSY;  Surgeon: Yetta Flock, MD;  Location: [place];  Service: Gastroenterology;;   BIOPSY  04/16/2018   Procedure: BIOPSY;  Surgeon: Irving Copas., MD;  Location: [place];  Service: Gastroenterology;;   CARDIOVASCULAR STRESS TEST  12/2008    mild fixed basal to mid septal perfusion defect felt likely due to artifact from LBBB, no ischemia, EF 58%   COLONOSCOPY     COLONOSCOPY WITH PROPOFOL N/A 04/16/2018   Procedure: COLONOSCOPY WITH PROPOFOL;  Surgeon: Irving Copas., MD;  Location: [place];  Service: Gastroenterology;  Laterality: N/A;   ESOPHAGOGASTRODUODENOSCOPY (EGD) WITH PROPOFOL N/A 04/14/2018   Procedure: ESOPHAGOGASTRODUODENOSCOPY (EGD) WITH PROPOFOL;  Surgeon: Yetta Flock, MD;  Location: [place];  Service: Gastroenterology;  Laterality: N/A;   FEMORAL-POPLITEAL BYPASS GRAFT Right 04/10/2018   Procedure: REPAIR RIGHT FEMORAL ARTERY PSEUDOANEURYSM, RETROPERITONEAL EXPOSURE OF ILIAC ARTERY, RIGHT POPLITEAL EMBOLECTOMY;  Surgeon: Angelia Mould, MD;  Location: [place];  Service: Vascular;  Laterality: Right;   FLEXIBLE SIGMOIDOSCOPY N/A 06/17/2019   Procedure: FLEXIBLE SIGMOIDOSCOPY;  Surgeon: Lavena Bullion, DO;  [facility];  Service: Gastroenterology;  Laterality: N/A;   HEMOSTASIS CLIP PLACEMENT  06/17/2019   Procedure: HEMOSTASIS CLIP PLACEMENT;  Surgeon: Lavena Bullion, DO;  [facility];  Service: Gastroenterology;;   LYMPH NODE DISSECTION Right 06/07/2013   Procedure: LYMPH NODE DISSECTION;  Surgeon: [name], MD;  Location: [place];  Service: Thoracic;  Laterality: Right;   PATCH ANGIOPLASTY Right 04/10/2018   Procedure: PATCH  ANGIOPLASTY OF RIGHT FEMORAL ARTERY USING  BOVINE PATCH, PATCH ANGIOPLASTY OF RIGHT POPLITEAL ARTERY USING BOVINE PATCH;  Surgeon: Angelia Mould, MD;  Location: [place];  Service: Vascular;  Laterality: Right;   SCHLEROTHERAPY  06/17/2019   Procedure: [place] OF VARICES;  Surgeon: Lavena Bullion, DO;  [facility];  Service: Gastroenterology;;   [place]   "large incision from chest to up to shoulder, the nerves were tied together, for raynaud's   THORACOTOMY  06/07/2013   Procedure: MINI/LIMITED THORACOTOMY; right middle lobe wedge resection;  Surgeon: [name], MD;  Location: [place];  Service: Thoracic;;   TONSILLECTOMY  child   TOTAL ABDOMINAL HYSTERECTOMY  1980's    W/ BSO   TOTAL HIP ARTHROPLASTY Right 04/28/2014   Procedure: RIGHT TOTAL HIP ARTHROPLASTY ANTERIOR APPROACH;  Surgeon: [name], MD;  [facility];  Service: Orthopedics;  Laterality: Right;   TRANSTHORACIC ECHOCARDIOGRAM  12/11/2008   ef 16-10%, grade 1 diastolic dysfunction/  mild LAE/  mild AR and MR/  trivial TR   VIDEO ASSISTED THORACOSCOPY (VATS)/WEDGE RESECTION Right 06/07/2013   Procedure: VIDEO ASSISTED THORACOSCOPY (VATS)/right upper lobectomy,  On Q;  Surgeon: [name], MD;  Location: [place];  Service: Thoracic;  Laterality: Right;   VIDEO BRONCHOSCOPY N/A 06/07/2013   Procedure: VIDEO BRONCHOSCOPY;  Surgeon: [name], MD;  Location: [facility];  Service: Thoracic;  Laterality: N/A;   VIDEO BRONCHOSCOPY WITH ENDOBRONCHIAL NAVIGATION N/A 05/04/2013   Procedure: VIDEO BRONCHOSCOPY WITH ENDOBRONCHIAL NAVIGATION;  Surgeon: [name], MD;  Location: [facility];  Service: Thoracic;  Laterality: N/A;    Social History   Tobacco Use   Smoking status: Never    Passive exposure: Past   Smokeless tobacco: Never  Vaping Use   Vaping Use: Never used  Substance Use Topics   Alcohol use: Not Currently   Drug use: Never    Family History  Problem Relation Age of Onset    Coronary artery disease Father    Colon cancer Father    Diabetes Father    Cancer Father        colon   Other Mother 10       MVA   Healthy Sister    Healthy Brother    Healthy Daughter    Hypothyroidism Daughter    Other Brother        killed in war   Pneumonia Sister    Healthy Daughter    Esophageal cancer Neg Hx    Kidney disease Neg Hx    Liver disease Neg Hx     Allergies  Allergen Reactions   Amlodipine Rash   Prochlorperazine Edisylate Anaphylaxis    Compazine--- tongue swells and rash   Aspirin Other (See Comments)    nose bleeds. Cannot take NSAIDS    Cymbalta [Duloxetine Hcl] Diarrhea, Nausea And Vomiting and Other (See Comments)    Increased blood pressure   Pamelor [Nortriptyline Hcl] Diarrhea and Nausea Only    Increased Heart rate and BP    Medication list has been reviewed and updated.  Current Outpatient Medications on File Prior to Visit  Medication Sig Dispense Refill   Abatacept (ORENCIA IV) Inject into the vein every 28 (twenty-eight) days.     Acetaminophen (TYLENOL EXTRA STRENGTH PO) Take 1-2 tablets by mouth every 6 (six) hours as needed (pain).      Biotin 1000 MCG tablet Take 1,000 mcg by mouth daily.     Calcium Carbonate-Vitamin D 500-125 MG-UNIT TABS Take 1 tablet by mouth daily.     carvedilol (COREG) 3.125 MG tablet Take 1 tablet (3.125 mg total) by mouth 2 (two) times daily with a meal. 180 tablet 3   cephALEXin (KEFLEX) 500 MG capsule Take 1 capsule (500 mg total) by mouth 2 (two) times daily. 14 capsule 0   denosumab (PROLIA) 60 MG/ML SOSY injection Inject 60 mg into the skin every 6 (six) months.     docusate sodium (COLACE) 100 MG capsule Take 100 mg by mouth 2 (two) times daily.     furosemide (LASIX) 20 MG tablet Take 1 tablet (20 mg total) by mouth every other day. (Patient not taking: Reported on 10/16/2021) 45 tablet 3   gabapentin (NEURONTIN) 300 MG capsule TAKE 1 CAPSULE BY MOUTH IN THE MORNING, 1 IN THE AFTERNOON, AND 2 AT  BEDTIME 360 capsule 1   Glucosamine-Chondroit-Vit C-Mn (GLUCOSAMINE CHONDR 1500 COMPLX PO) Take 1 capsule by mouth daily.     HYDROcodone-acetaminophen (NORCO/VICODIN) 5-325 MG tablet Take 0.5-1 tablets by mouth every 8 (eight) hours as needed. 30 tablet 0   levothyroxine (SYNTHROID) 75 MCG tablet TAKE 1 TABLET  BY MOUTH DAILY BEFORE BREAKFAST. 90 tablet 0   methocarbamol (ROBAXIN) 500 MG tablet TAKE 1 TABLET BY MOUTH EVERY 6 HOURS AS NEEDED FOR MUSCLE SPASMS. 40 tablet 0   Multiple Vitamin (MULTIVITAMIN) tablet Take 1 tablet by mouth daily.     omeprazole (PRILOSEC) 20 MG capsule TAKE 1 CAPSULE BY MOUTH EVERY DAY 90 capsule 3   Probiotic Product (PROBIOTIC PO) Take 1 capsule by mouth daily.      VITAMIN D PO Take by mouth daily.     No current facility-administered medications on file prior to visit.    Review of Systems:  As per HPI- otherwise negative.   Physical Examination: There were no vitals filed for this visit. There were no vitals filed for this visit. There is no height or weight on file to calculate BMI. Ideal Body Weight:    GEN: no acute distress. HEENT: Atraumatic, Normocephalic.  Ears and Nose: No external deformity. CV: RRR, No M/G/R. No JVD. No thrill. No extra heart sounds. PULM: CTA B, no wheezes, crackles, rhonchi. No retractions. No resp. distress. No accessory muscle use. ABD: S, NT, ND, +BS. No rebound. No HSM. EXTR: No c/c/e PSYCH: Normally interactive. Conversant.    Assessment and Plan: ***  Signed Lamar Blinks, MD

## 2021-10-22 NOTE — Progress Notes (Signed)
Urine culture has resulted: Positive for Proteus Mirabilis.  Resistant to nitrofurantoin.  Please forward results to her PCP for further evaluation and management as requested.

## 2021-10-23 ENCOUNTER — Ambulatory Visit: Payer: Medicare PPO | Admitting: Family Medicine

## 2021-10-23 VITALS — BP 134/60 | HR 65 | Temp 97.7°F | Resp 18 | Ht 60.0 in | Wt 109.4 lb

## 2021-10-23 DIAGNOSIS — N390 Urinary tract infection, site not specified: Secondary | ICD-10-CM | POA: Diagnosis not present

## 2021-10-23 DIAGNOSIS — C3410 Malignant neoplasm of upper lobe, unspecified bronchus or lung: Secondary | ICD-10-CM

## 2021-10-23 MED ORDER — ESTRADIOL 0.1 MG/GM VA CREA: TOPICAL_CREAM | VAGINAL | 12 refills | Status: DC

## 2021-10-23 NOTE — Patient Instructions (Signed)
Good to see you again today!  Let me know if the estrogen cream seems to help you with reduced number of UTI infections

## 2021-10-25 ENCOUNTER — Telehealth: Payer: Self-pay

## 2021-10-25 NOTE — Telephone Encounter (Signed)
Pt has called to advise she wishes to move forward with taking Tagrisso. She would like Dr. Julien Nordmann to prescribe this for her.  She wishes to receives her calls at on her cell number.

## 2021-10-30 ENCOUNTER — Encounter: Payer: Self-pay | Admitting: Family Medicine

## 2021-10-30 DIAGNOSIS — R1032 Left lower quadrant pain: Secondary | ICD-10-CM

## 2021-10-30 MED ORDER — AMOXICILLIN-POT CLAVULANATE 500-125 MG PO TABS: 1.0000 | ORAL_TABLET | Freq: Two times a day (BID) | ORAL | 0 refills | Status: DC

## 2021-10-30 NOTE — Telephone Encounter (Signed)
Called her back- she was in on 8/30 with UTI and finished up her abx, she seemed to be doing fine until her sx came back yesterday Her urine culture from 8/30 showed Proteus, resistant only to nitrofurantoin At this point [name] does not feel she needs to come to the emergency department.  We plan for her to start antibiotics in the morning and bring in a urine sample for culture.  I will also set up a CT renal stone study because she has had several UTIs recently-rule out a retained stone

## 2021-10-31 ENCOUNTER — Other Ambulatory Visit (INDEPENDENT_AMBULATORY_CARE_PROVIDER_SITE_OTHER): Payer: Medicare PPO

## 2021-10-31 DIAGNOSIS — R1032 Left lower quadrant pain: Secondary | ICD-10-CM

## 2021-11-01 ENCOUNTER — Ambulatory Visit (HOSPITAL_BASED_OUTPATIENT_CLINIC_OR_DEPARTMENT_OTHER)
Admission: RE | Admit: 2021-11-01 | Discharge: 2021-11-01 | Disposition: A | Discharge status: Home / Self Care | Payer: Medicare PPO | Source: Ambulatory Visit | Attending: Family Medicine | Admitting: Family Medicine

## 2021-11-01 DIAGNOSIS — R1032 Left lower quadrant pain: Secondary | ICD-10-CM | POA: Diagnosis not present

## 2021-11-01 DIAGNOSIS — N390 Urinary tract infection, site not specified: Secondary | ICD-10-CM | POA: Diagnosis not present

## 2021-11-01 DIAGNOSIS — M1612 Unilateral primary osteoarthritis, left hip: Secondary | ICD-10-CM | POA: Diagnosis not present

## 2021-11-01 DIAGNOSIS — K449 Diaphragmatic hernia without obstruction or gangrene: Secondary | ICD-10-CM | POA: Diagnosis not present

## 2021-11-01 DIAGNOSIS — K7689 Other specified diseases of liver: Secondary | ICD-10-CM | POA: Diagnosis not present

## 2021-11-03 ENCOUNTER — Telehealth: Payer: Self-pay | Admitting: Family Medicine

## 2021-11-03 ENCOUNTER — Encounter: Payer: Self-pay | Admitting: Family Medicine

## 2021-11-03 LAB — URINE CULTURE
MICRO NUMBER:: 13917609
SPECIMEN QUALITY:: ADEQUATE

## 2021-11-03 NOTE — Telephone Encounter (Signed)
Received her CT scan and urine culture She is on augmentin Called her but no answer right now [employer] message   Results for orders placed or performed in visit on 10/31/21  Urine Culture   Specimen: Urine  Result Value Ref Range   MICRO NUMBER: [id number]    SPECIMEN QUALITY: Adequate    Sample Source NOT GIVEN    STATUS: FINAL    ISOLATE 1: Proteus mirabilis (A)       Susceptibility   Proteus mirabilis - URINE CULTURE, REFLEX    AMOX/CLAVULANIC <=2 Sensitive     AMPICILLIN <=2 Sensitive     AMPICILLIN/SULBACTAM <=2 Sensitive     CEFAZOLIN* <=4 Not Reportable      * For infections other than uncomplicated UTI caused by E. coli, K. pneumoniae or P. mirabilis: Cefazolin is resistant if MIC > or = 8 mcg/mL. (Distinguishing susceptible versus intermediate for isolates with MIC < or = 4 mcg/mL requires additional testing.) For uncomplicated UTI caused by E. coli, K. pneumoniae or P. mirabilis: Cefazolin is susceptible if MIC <32 mcg/mL and predicts susceptible to the oral agents cefaclor, cefdinir, cefpodoxime, cefprozil, cefuroxime, cephalexin and loracarbef.     CEFTAZIDIME <=1 Sensitive     CEFEPIME <=1 Sensitive     CEFTRIAXONE <=1 Sensitive     CIPROFLOXACIN <=0.25 Sensitive     LEVOFLOXACIN <=0.12 Sensitive     GENTAMICIN <=1 Sensitive     IMIPENEM 1 Sensitive     NITROFURANTOIN 64 Resistant     PIP/TAZO <=4 Sensitive     TOBRAMYCIN <=1 Sensitive     TRIMETH/SULFA* <=20 Sensitive      * For infections other than uncomplicated UTI caused by E. coli, K. pneumoniae or P. mirabilis: Cefazolin is resistant if MIC > or = 8 mcg/mL. (Distinguishing susceptible versus intermediate for isolates with MIC < or = 4 mcg/mL requires additional testing.) For uncomplicated UTI caused by E. coli, K. pneumoniae or P. mirabilis: Cefazolin is susceptible if MIC <32 mcg/mL and predicts susceptible to the oral agents cefaclor, cefdinir, cefpodoxime, cefprozil, cefuroxime,  cephalexin and loracarbef. Legend: S = Susceptible  I = Intermediate R = Resistant  NS = Not susceptible * = Not tested  NR = Not reported **NN = See antimicrobic comments      IMPRESSION: 1. No renal calculus or obstructive uropathy bilaterally. 2. Stable 7 mm left lower lobe pulmonary nodule, unchanged from 03/27/2021. Non-contrast chest CT at 6-12 months is recommended. If the nodule is stable at time of repeat CT, then future CT at 18-24 months (from today's scan) is considered optional for low-risk patients, but is recommended for high-risk patients. This recommendation follows the consensus statement: Guidelines for Management of Incidental Pulmonary Nodules Detected on CT Images: From the Fleischner Society 2017; Radiology 2017; 284:228-243. 3. Moderate hiatal hernia. 4. Aortic atherosclerosis.

## 2021-11-05 ENCOUNTER — Other Ambulatory Visit: Payer: Self-pay | Admitting: Family Medicine

## 2021-11-06 ENCOUNTER — Ambulatory Visit (HOSPITAL_COMMUNITY)
Admission: RE | Admit: 2021-11-06 | Discharge: 2021-11-06 | Disposition: A | Discharge status: Home / Self Care | Payer: Medicare PPO | Source: Ambulatory Visit | Attending: Rheumatology | Admitting: Rheumatology

## 2021-11-06 DIAGNOSIS — M0609 Rheumatoid arthritis without rheumatoid factor, multiple sites: Secondary | ICD-10-CM | POA: Diagnosis not present

## 2021-11-06 MED ORDER — SODIUM CHLORIDE 0.9 % IV SOLN
500.0000 mg | INTRAVENOUS | Status: DC
  Administered 2021-11-06: 500 mg via INTRAVENOUS
  Filled 2021-11-06: qty 20

## 2021-11-06 MED ORDER — ACETAMINOPHEN 325 MG PO TABS: 650.0000 mg | ORAL_TABLET | ORAL | Status: DC

## 2021-11-06 MED ORDER — DIPHENHYDRAMINE HCL 25 MG PO CAPS: 25.0000 mg | ORAL_CAPSULE | ORAL | Status: DC

## 2021-11-06 NOTE — Progress Notes (Signed)
Pt finished 10 day course of augmentin twice daily for 10 days for a UTI.  Per Devki at Dr Arlean Hopping office ok to proceed with orencia today as long as all symptoms have resolved.

## 2021-11-07 ENCOUNTER — Other Ambulatory Visit: Payer: Self-pay | Admitting: Orthopaedic Surgery

## 2021-11-08 ENCOUNTER — Other Ambulatory Visit (HOSPITAL_COMMUNITY): Payer: Self-pay

## 2021-11-11 ENCOUNTER — Telehealth: Payer: Self-pay | Admitting: Pharmacist

## 2021-11-11 ENCOUNTER — Other Ambulatory Visit (HOSPITAL_COMMUNITY): Payer: Self-pay

## 2021-11-11 ENCOUNTER — Inpatient Hospital Stay: Payer: Medicare PPO | Attending: Internal Medicine | Admitting: Internal Medicine

## 2021-11-11 ENCOUNTER — Telehealth: Payer: Self-pay | Admitting: Pharmacy Technician

## 2021-11-11 ENCOUNTER — Other Ambulatory Visit: Payer: Self-pay

## 2021-11-11 ENCOUNTER — Inpatient Hospital Stay: Payer: Medicare PPO | Admitting: Pharmacist

## 2021-11-11 ENCOUNTER — Other Ambulatory Visit: Payer: Self-pay | Admitting: *Deleted

## 2021-11-11 VITALS — BP 147/65 | HR 81 | Temp 98.4°F | Resp 16 | Wt 109.0 lb

## 2021-11-11 DIAGNOSIS — D649 Anemia, unspecified: Secondary | ICD-10-CM | POA: Insufficient documentation

## 2021-11-11 DIAGNOSIS — C3410 Malignant neoplasm of upper lobe, unspecified bronchus or lung: Secondary | ICD-10-CM | POA: Diagnosis not present

## 2021-11-11 DIAGNOSIS — Z85118 Personal history of other malignant neoplasm of bronchus and lung: Secondary | ICD-10-CM | POA: Diagnosis not present

## 2021-11-11 DIAGNOSIS — Z79899 Other long term (current) drug therapy: Secondary | ICD-10-CM | POA: Diagnosis not present

## 2021-11-11 MED ORDER — OSIMERTINIB MESYLATE 80 MG PO TABS
80.0000 mg | ORAL_TABLET | Freq: Every day | ORAL | 3 refills | Status: DC
  Filled 2021-11-11 (×2): qty 30, 30d supply, fill #0
  Filled 2021-12-04: qty 30, 30d supply, fill #1

## 2021-11-11 NOTE — Telephone Encounter (Signed)
Oral Oncology Patient Advocate Encounter  Prior Authorization for [employer] has been approved.    PA# [id number] Effective dates: 11/11/2021 through 05/10/2022  Patients co-pay is $100.    Lady Deutscher, CPhT-Adv Oncology Pharmacy Patient [place] Direct Number: [phone]  Fax: [phone]

## 2021-11-11 NOTE — Telephone Encounter (Signed)
Oral Oncology Patient Advocate Encounter   Received notification that prior authorization for Tagrisso is required.   PA submitted on 11/11/2021 Key [id number] Status is pending     Lady Deutscher, CPhT-Adv Oncology Pharmacy Patient [place] Direct Number: [phone]  Fax: [phone]

## 2021-11-11 NOTE — Progress Notes (Signed)
[facility] [phone]) [phone]   Fax:(336) [phone]  OFFICE PROGRESS NOTE  Ellis, Raven Filler, MD [address] [place] [address]  DIAGNOSIS: Multifocal non-small cell lung cancer, adenocarcinoma initially diagnosed as stage IIA (T2b, N0, M0) non-small cell lung cancer, adenocarcinoma with positive EGFR mutation in exon 21 (L858R) presented with right upper lobe lung mass diagnosed in March of 2015.  PRIOR THERAPY: Status post right upper lobectomy with wedge resection of the right middle lobe under the care of Dr. Servando Ellis on 06/07/2013  CURRENT THERAPY: Tagrisso 80 mg p.o. daily expected to start in the next few days..  INTERVAL HISTORY: [name] 88 y.o. female returns to the clinic today for follow-up visit accompanied by her daughter [name].  The patient is feeling fine today with no concerning complaints except for the arthralgia.  She is currently on treatment for rheumatoid arthritis by Dr. Estanislado Ellis.  The patient denied having any current chest pain, shortness of breath, cough or hemoptysis.  She denied having any nausea, vomiting, diarrhea or constipation.  She has no headache or visual changes.  She has no recent weight loss or night sweats.  She is here today for evaluation before consideration of treatment with Tagrisso.   MEDICAL HISTORY: Past Medical History:  Diagnosis Date   Arthralgia of multiple joints    followed by dr [name]   Arthritis    Cardiomyopathy ([place])    Chronic constipation    Chronic inflammatory arthritis    rhemotolgist-  dr [name] ([facility])   Dry eyes    eye drops used    GERD (gastroesophageal reflux disease)    H/O discoid lupus erythematosus    Hiatal hernia    History of colon polyps    Hypothyroidism    Iron deficiency anemia    LBBB (left bundle branch block) 2010   Mitchell's disease (erythromelalgia) [facility])    neurologist-  dr [name]   Nocturia    Non-small cell cancer  of right lung [facility]) surgeon-- dr [name]/  oncologist-  dr [name]--- per lov notes no recurrence/   11-18-2017 per pt denies any symptoms   dx 2015--  Stage IIA (T2b,N0,M0) , +EGFR  mutation in exon 21, non-small cell adenocarcinoma right upper lobe---  s/p  Right upper lobectomy , right middley wedge resection and node dissection---  no chemo or radiation therapy   OA (osteoarthritis)    hands   Osteoporosis    PONV (postoperative nausea and vomiting)    likes phenergan   Raynaud's phenomenon 1965   Renal insufficiency    Rheumatoid arthritis ([place])    Sciatica    Scoliosis    Sjogren's syndrome ([place])     ALLERGIES:  is allergic to amlodipine, prochlorperazine edisylate, aspirin, cymbalta [duloxetine hcl], and pamelor [nortriptyline hcl].  MEDICATIONS:  Current Outpatient Medications  Medication Sig Dispense Refill   Abatacept (ORENCIA IV) Inject into the vein every 28 (twenty-eight) days.     Acetaminophen (TYLENOL EXTRA STRENGTH PO) Take 1-2 tablets by mouth every 6 (six) hours as needed (pain).      amoxicillin-clavulanate (AUGMENTIN) 500-125 MG tablet Take 1 tablet (500 mg total) by mouth in the morning and at bedtime. 14 tablet 0   Biotin 1000 MCG tablet Take 1,000 mcg by mouth daily.     Calcium Carbonate-Vitamin D 500-125 MG-UNIT TABS Take 1 tablet by mouth daily.     carvedilol (COREG) 3.125 MG tablet Take 1  tablet (3.125 mg total) by mouth 2 (two) times daily with a meal. 180 tablet 3   denosumab (PROLIA) 60 MG/ML SOSY injection Inject 60 mg into the skin every 6 (six) months.     docusate sodium (COLACE) 100 MG capsule Take 100 mg by mouth 2 (two) times daily.     estradiol (ESTRACE VAGINAL) 0.1 MG/GM vaginal cream Place one gram vaginally up to three times a week as needed to maintain comfort 42.5 g 12   furosemide (LASIX) 20 MG tablet Take 1 tablet (20 mg total) by mouth every other day. 45 tablet 3   gabapentin (NEURONTIN) 300 MG capsule TAKE 1 CAPSULE BY MOUTH IN THE  MORNING, 1 IN THE AFTERNOON, AND 2 AT BEDTIME 360 capsule 1   Glucosamine-Chondroit-Vit C-Mn (GLUCOSAMINE CHONDR 1500 COMPLX PO) Take 1 capsule by mouth daily.     HYDROcodone-acetaminophen (NORCO/VICODIN) 5-325 MG tablet Take 0.5-1 tablets by mouth every 8 (eight) hours as needed. 30 tablet 0   levothyroxine (SYNTHROID) 75 MCG tablet Take 1 tablet (75 mcg total) by mouth daily before breakfast. 90 tablet 1   methocarbamol (ROBAXIN) 500 MG tablet TAKE 1 TABLET BY MOUTH EVERY 6 HOURS AS NEEDED FOR MUSCLE SPASMS. 40 tablet 0   Multiple Vitamin (MULTIVITAMIN) tablet Take 1 tablet by mouth daily.     omeprazole (PRILOSEC) 20 MG capsule TAKE 1 CAPSULE BY MOUTH EVERY DAY 90 capsule 3   Probiotic Product (PROBIOTIC PO) Take 1 capsule by mouth daily.      VITAMIN D PO Take by mouth daily.     No current facility-administered medications for this visit.    SURGICAL HISTORY:  Past Surgical History:  Procedure Laterality Date   ANTERIOR HIP REVISION Right 11/27/2017   Procedure: RIGHT HIP ACETABULAR REVISION;  Surgeon: [name], MD;  Location: [facility];  Service: Orthopedics;  Laterality: Right;   ANTERIOR HIP REVISION Right 01/24/2018   Procedure: OPEN REDUCTION OF DISLOCATED ANTERIOR HIP WITH REVISION OF LINER AND HIP BALL;  Surgeon: [name], MD;  Location: [facility];  Service: Orthopedics;  Laterality: Right;   APPENDECTOMY  1950s   BIOPSY  04/14/2018   Procedure: BIOPSY;  Surgeon: Yetta Flock, MD;  Location: [place];  Service: Gastroenterology;;   BIOPSY  04/16/2018   Procedure: BIOPSY;  Surgeon: Irving Copas., MD;  Location: [place];  Service: Gastroenterology;;   CARDIOVASCULAR STRESS TEST  12/2008    mild fixed basal to mid septal perfusion defect felt likely due to artifact from LBBB, no ischemia, EF 58%   COLONOSCOPY     COLONOSCOPY WITH PROPOFOL N/A 04/16/2018   Procedure: COLONOSCOPY WITH PROPOFOL;  Surgeon: Irving Copas., MD;   Location: [place];  Service: Gastroenterology;  Laterality: N/A;   ESOPHAGOGASTRODUODENOSCOPY (EGD) WITH PROPOFOL N/A 04/14/2018   Procedure: ESOPHAGOGASTRODUODENOSCOPY (EGD) WITH PROPOFOL;  Surgeon: Yetta Flock, MD;  Location: [place];  Service: Gastroenterology;  Laterality: N/A;   FEMORAL-POPLITEAL BYPASS GRAFT Right 04/10/2018   Procedure: REPAIR RIGHT FEMORAL ARTERY PSEUDOANEURYSM, RETROPERITONEAL EXPOSURE OF ILIAC ARTERY, RIGHT POPLITEAL EMBOLECTOMY;  Surgeon: Angelia Mould, MD;  Location: [place];  Service: Vascular;  Laterality: Right;   FLEXIBLE SIGMOIDOSCOPY N/A 06/17/2019   Procedure: FLEXIBLE SIGMOIDOSCOPY;  Surgeon: Lavena Bullion, DO;  [facility];  Service: Gastroenterology;  Laterality: N/A;   HEMOSTASIS CLIP PLACEMENT  06/17/2019   Procedure: HEMOSTASIS CLIP PLACEMENT;  Surgeon: Lavena Bullion, DO;  [facility];  Service: Gastroenterology;;   LYMPH NODE  DISSECTION Right 06/07/2013   Procedure: LYMPH NODE DISSECTION;  Surgeon: [name], MD;  Location: [place];  Service: Thoracic;  Laterality: Right;   PATCH ANGIOPLASTY Right 04/10/2018   Procedure: PATCH  ANGIOPLASTY OF RIGHT FEMORAL ARTERY USING BOVINE PATCH, PATCH ANGIOPLASTY OF RIGHT POPLITEAL ARTERY USING BOVINE PATCH;  Surgeon: Angelia Mould, MD;  Location: [place];  Service: Vascular;  Laterality: Right;   SCHLEROTHERAPY  06/17/2019   Procedure: [place] OF VARICES;  Surgeon: Lavena Bullion, DO;  [facility];  Service: Gastroenterology;;   [place]   "large incision from chest to up to shoulder, the nerves were tied together, for raynaud's   THORACOTOMY  06/07/2013   Procedure: MINI/LIMITED THORACOTOMY; right middle lobe wedge resection;  Surgeon: [name], MD;  Location: [place];  Service: Thoracic;;   TONSILLECTOMY  child   TOTAL ABDOMINAL HYSTERECTOMY  1980's    W/ BSO   TOTAL HIP ARTHROPLASTY Right 04/28/2014    Procedure: RIGHT TOTAL HIP ARTHROPLASTY ANTERIOR APPROACH;  Surgeon: [name], MD;  [facility]: [facility];  Service: Orthopedics;  Laterality: Right;   TRANSTHORACIC ECHOCARDIOGRAM  12/11/2008   ef 58-09%, grade 1 diastolic dysfunction/  mild LAE/  mild AR and MR/  trivial TR   VIDEO ASSISTED THORACOSCOPY (VATS)/WEDGE RESECTION Right 06/07/2013   Procedure: VIDEO ASSISTED THORACOSCOPY (VATS)/right upper lobectomy, On Q;  Surgeon: [name], MD;  Location: [place];  Service: Thoracic;  Laterality: Right;   VIDEO BRONCHOSCOPY N/A 06/07/2013   Procedure: VIDEO BRONCHOSCOPY;  Surgeon: [name], MD;  Location: [place];  Service: Thoracic;  Laterality: N/A;   VIDEO BRONCHOSCOPY WITH ENDOBRONCHIAL NAVIGATION N/A 05/04/2013   Procedure: VIDEO BRONCHOSCOPY WITH ENDOBRONCHIAL NAVIGATION;  Surgeon: [name], MD;  Location: [place];  Service: Thoracic;  Laterality: N/A;    REVIEW OF SYSTEMS:  Constitutional: positive for fatigue Eyes: negative Ears, nose, mouth, throat, and face: negative Respiratory: negative Cardiovascular: negative Gastrointestinal: negative Genitourinary:negative Integument/breast: negative Hematologic/lymphatic: negative Musculoskeletal:positive for arthralgias Neurological: negative Behavioral/Psych: negative Endocrine: negative Allergic/Immunologic: negative   PHYSICAL EXAMINATION: General appearance: alert, cooperative, and no distress Head: Normocephalic, without obvious abnormality, atraumatic Neck: no adenopathy, no JVD, supple, symmetrical, trachea midline, and thyroid not enlarged, symmetric, no tenderness/mass/nodules Lymph nodes: Cervical, supraclavicular, and axillary nodes normal. Resp: clear to auscultation bilaterally Back: symmetric, no curvature. ROM normal. No CVA tenderness. Cardio: regular rate and rhythm, S1, S2 normal, no murmur, click, rub or gallop GI: soft, non-tender; bowel sounds normal; no masses,  no  organomegaly Extremities: extremities normal, atraumatic, no cyanosis or edema Neurologic: Alert and oriented X 3, normal strength and tone. Normal symmetric reflexes. Normal coordination and gait  ECOG PERFORMANCE STATUS: 1 - Symptomatic but completely ambulatory  Blood pressure (!) 147/65, pulse 81, temperature 98.4 F (36.9 C), temperature source Oral, resp. rate 16, weight 109 lb (49.4 kg), SpO2 94 %.  LABORATORY DATA: Lab Results  Component Value Date   WBC 3.9 (L) 10/09/2021   HGB 10.9 (L) 10/09/2021   HCT 34.3 (L) 10/09/2021   MCV 94.2 10/09/2021   PLT 200 10/09/2021      Chemistry      Component Value Date/Time   NA 133 (L) 10/09/2021 1108   NA 130 (L) 04/26/2021 0000   NA 130 (L) 08/21/2016 1125   K 5.2 (H) 10/09/2021 1108   K 4.8 08/21/2016 1125   CL 100 10/09/2021 1108   CO2 28 10/09/2021 1108   CO2 26  08/21/2016 1125   BUN 26 (H) 10/09/2021 1108   BUN 27 04/26/2021 0000   BUN 25.6 08/21/2016 1125   CREATININE 1.05 (H) 10/09/2021 1108   CREATININE 1.14 (H) 09/27/2021 1510   CREATININE 0.98 (H) 05/21/2020 1341   CREATININE 1.2 (H) 08/21/2016 1125      Component Value Date/Time   CALCIUM 9.3 10/09/2021 1108   CALCIUM 10.0 08/21/2016 1125   ALKPHOS 39 10/09/2021 1108   ALKPHOS 54 08/21/2016 1125   AST 30 10/09/2021 1108   AST 27 09/27/2021 1510   AST 27 08/21/2016 1125   ALT 13 10/09/2021 1108   ALT 16 09/27/2021 1510   ALT 13 08/21/2016 1125   BILITOT 0.3 10/09/2021 1108   BILITOT 0.4 09/27/2021 1510   BILITOT 0.31 08/21/2016 1125       RADIOGRAPHIC STUDIES: CT RENAL STONE STUDY  Result Date: 11/02/2021 CLINICAL DATA:  Flank pain, kidney stone suspected. Recent UTI. Left back and left lower quadrant pain. EXAM: CT ABDOMEN AND PELVIS WITHOUT CONTRAST TECHNIQUE: Multidetector CT imaging of the abdomen and pelvis was performed following the standard protocol without IV contrast. RADIATION DOSE REDUCTION: This exam was performed according to the  departmental dose-optimization program which includes automated exposure control, adjustment of the mA and/or kV according to patient size and/or use of iterative reconstruction technique. COMPARISON:  04/17/2017, 03/27/2021. FINDINGS: Lower chest: There is a stable nodule in the left lower lobe measuring 7 mm, axial image 1. Mild atelectasis or scarring is noted at the lung bases. Hepatobiliary: A subcentimeter hypodensity is present in the posterior right lobe of the liver. No biliary ductal dilatation. The gallbladder is without stones. Pancreas: Unremarkable. No pancreatic ductal dilatation or surrounding inflammatory changes. Spleen: Normal in size without focal abnormality. Adrenals/Urinary Tract: The adrenal glands are not well visualized on exam. Renal cortical scarring is noted on the right. No renal calculus or hydronephrosis bilaterally. The bladder is unremarkable. Stomach/Bowel: There is a moderate hiatal hernia. Stomach is within normal limits. Appendix is not seen. No evidence of bowel wall thickening, distention, or inflammatory changes. No free air or pneumatosis. Moderate amount of retained stool in the colon, possible constipation. Vascular/Lymphatic: Aortic atherosclerosis. No enlarged abdominal or pelvic lymph nodes. Reproductive: Status post hysterectomy. No adnexal masses. Other: No abdominopelvic ascites. Musculoskeletal: Total hip arthroplasty changes are noted on the right. There are old healed fractures of the superior and inferior pubic rami on the right. Pars defects are noted at L5 with grade 2 anterolisthesis is L5-S1. Degenerative changes are present in the thoracolumbar spine and left hip. There is dextroscoliosis of the lumbar spine. IMPRESSION: 1. No renal calculus or obstructive uropathy bilaterally. 2. Stable 7 mm left lower lobe pulmonary nodule, unchanged from 03/27/2021. Non-contrast chest CT at 6-12 months is recommended. If the nodule is stable at time of repeat CT, then  future CT at 18-24 months (from today's scan) is considered optional for low-risk patients, but is recommended for high-risk patients. This recommendation follows the consensus statement: Guidelines for Management of Incidental Pulmonary Nodules Detected on CT Images: From the Fleischner Society 2017; Radiology 2017; 284:228-243. 3. Moderate hiatal hernia. 4. Aortic atherosclerosis. Electronically Signed   By: Brett Fairy M.D.   On: 11/02/2021 23:39    ASSESSMENT AND PLAN:  This is a very pleasant 88 years old white female with multifocal bilateral adenocarcinoma initially diagnosed as stage IIA non-small cell lung cancer, adenocarcinoma with positive EGFR mutation in exon 21 status post right upper lobectomy as well as  wedge resection of the right middle lobe in March 2015 and the patient declined adjuvant systemic chemotherapy. The patient had recent imaging studies that showed increase in the size and number of multifocal disease. I discussed with her her treatment options including continuous observation and monitoring versus treatment with targeted therapy with Tagrisso 80 mg p.o. daily since the patient has positive EGFR mutation in exon 21 ([id number]).  The patient after discussion with her family decided to proceed with the treatment and she is here today for more detailed discussion of this option. I discussed with the patient the adverse effect of this treatment including but not limited to skin rash, diarrhea, inflammation of the lung, kidney, liver as well as dry skin. She will see the pharmacist for oral oncolytic today for education and help the patient with the refill of her medication. I will see her back for follow-up visit in around 3 weeks for evaluation and repeat blood work. She will have a baseline EKG today. For the anemia, we will continue to monitor her hemoglobin and hematocrit closely.  The patient will continue with the oral iron supplements. The patient was advised to call  immediately if she has any other concerning symptoms in the interval.  The patient voices understanding of current disease status and treatment options and is in agreement with the current care plan. All questions were answered. The patient knows to call the clinic with any problems, questions or concerns. We can certainly see the patient much sooner if necessary. The total time spent in the appointment was 30 minutes.  Disclaimer: This note was dictated with voice recognition software. Similar sounding words can inadvertently be transcribed and may not be corrected upon review.

## 2021-11-11 NOTE — Progress Notes (Signed)
START ON PATHWAY REGIMEN - Non-Small Cell Lung     A cycle is every 28 days:     Osimertinib   **Always confirm dose/schedule in your pharmacy ordering system**  Patient Characteristics: Stage IV Metastatic, Nonsquamous, Molecular Analysis Completed, Molecular Alteration Present and Eligible for Molecular Targeted Therapy, Initial Molecular Targeted Therapy, EGFR Mutation - Common (Exon 19 Deletion or Exon 21 L858R Substitution) Therapeutic Status: Stage IV Metastatic Histology: Nonsquamous Cell Broad Molecular Profiling Status: Molecular Analysis Completed Molecular Analysis Results: Alteration Present and Eligible for Molecular Targeted Therapy Molecular Alteration Present: EGFR Mutation - Common (Exon 19 Deletion or Exon 21 L858R Substitution) Molecular Targeted Line of Therapy: Initial Molecular Targeted Therapy Intent of Therapy: Non-Curative / Palliative Intent, Discussed with Patient 

## 2021-11-11 NOTE — Telephone Encounter (Addendum)
Oral Chemotherapy Pharmacist Encounter  I met with patient and patient's daughter, [name], in clinic for overview of: Tagrisso for the adjuvant treatment of  EGFR mutation-positive (exon 21) non-small cell lung cancer, planned duration until disease progression or unacceptable toxicity or up to 3 years.   Counseled patient on administration, dosing, side effects, monitoring, drug-food interactions, safe handling, storage, and disposal.  CBC w/ Diff and CMP from 10/09/21 assessed, no baseline renal or hepatic dose adjustments required. Baseline EKG obtained 11/11/21 in clinic, Qtc 468m. Prescription dose and frequency assessed for appropriateness. Appropriate for therapy initiation.   Patient will take Tagrisso 80 tablets, 1 tablet by mouth once daily, without regard to food.  Tagrisso start date: ~11/13/21  Adverse effects include but are not limited to: diarrhea, mouth sores, fatigue, dry skin, rash, nail changes, altered cardiac conduction, and decreased blood counts or electrolytes.  Patient has already obtained anti diarrheal and alert the office of 4 or more loose stools above baseline. Patient endorses using a stool softener twice daily, we discussed if patient does develop diarrhea, to hold dosing the stool softener until diarrhea resolves.  Reviewed with patient importance of keeping a medication schedule and plan for any missed doses. No barriers to medication adherence identified.  Medication reconciliation performed and medication/allergy list updated. Current medication list in Epic reviewed, no relevant/significant DDIs with Tagrisso identified.  All questions answered. Patient agreement for treatment documented in MD note on 11/11/21.  Ms. BOguinvoiced understanding and appreciation.   Medication education handout given to patient. Patient knows to call the office with questions or concerns. Oral Chemotherapy Clinic phone number provided to patient.   RLeron Croak PharmD,  BCPS, BCOP Hematology/Oncology Clinical Pharmacist [facility] [place] [id number]/25/2023 12:00 PM

## 2021-11-12 ENCOUNTER — Other Ambulatory Visit: Payer: Self-pay

## 2021-11-18 ENCOUNTER — Telehealth: Payer: Self-pay | Admitting: Internal Medicine

## 2021-11-18 NOTE — Telephone Encounter (Signed)
Scheduled per 09/25 los, patient has been called and notified.

## 2021-11-21 NOTE — Progress Notes (Signed)
HPI: FU CHF. Nuclear study 2010 showed ejection fraction 58% with fixed septal defect related to left bundle branch block and no ischemia.  Echocardiogram February 2020 showed normal LV systolic function grade 1 diastolic dysfunction, mild left atrial enlargement, mild aortic insufficiency.  Monitor April 2021 showed occasional PVC and brief episode of SVT.  Patient has been diagnosed with lung cancer.  CT February 2023 showed widespread pulmonary nodules increasing in size and emphysema. Echocardiogram March 2023 showed ejection fraction 40 to 17%, grade 1 diastolic dysfunction, mild mitral regurgitation, mild aortic insufficiency.  Since last seen, she has occasional dyspnea but denies chest pain, palpitations or syncope.  Her pedal edema is controlled.  Current Outpatient Medications  Medication Sig Dispense Refill   Abatacept (ORENCIA IV) Inject into the vein every 28 (twenty-eight) days.     Acetaminophen (TYLENOL EXTRA STRENGTH PO) Take 1-2 tablets by mouth every 6 (six) hours as needed (pain).      Biotin 1000 MCG tablet Take 1,000 mcg by mouth daily.     Calcium Carbonate-Vitamin D 500-125 MG-UNIT TABS Take 1 tablet by mouth daily.     carvedilol (COREG) 3.125 MG tablet Take 1 tablet (3.125 mg total) by mouth 2 (two) times daily with a meal. 180 tablet 3   cephALEXin (KEFLEX) 500 MG capsule Take 1 capsule (500 mg total) by mouth 2 (two) times daily. 14 capsule 0   denosumab (PROLIA) 60 MG/ML SOSY injection Inject 60 mg into the skin every 6 (six) months.     docusate sodium (COLACE) 100 MG capsule Take 100 mg by mouth 2 (two) times daily.     estradiol (ESTRACE VAGINAL) 0.1 MG/GM vaginal cream Place one gram vaginally up to three times a week as needed to maintain comfort 42.5 g 12   furosemide (LASIX) 20 MG tablet Take 1 tablet (20 mg total) by mouth every other day. 45 tablet 3   gabapentin (NEURONTIN) 300 MG capsule TAKE 1 CAPSULE BY MOUTH IN THE MORNING, 1 IN THE AFTERNOON, AND 2 AT  BEDTIME 360 capsule 1   Glucosamine-Chondroit-Vit C-Mn (GLUCOSAMINE CHONDR 1500 COMPLX PO) Take 1 capsule by mouth daily.     HYDROcodone-acetaminophen (NORCO/VICODIN) 5-325 MG tablet Take 0.5-1 tablets by mouth every 8 (eight) hours as needed. 30 tablet 0   levothyroxine (SYNTHROID) 75 MCG tablet Take 1 tablet (75 mcg total) by mouth daily before breakfast. 90 tablet 1   losartan (COZAAR) 25 MG tablet Take 25 mg by mouth daily.     methocarbamol (ROBAXIN) 500 MG tablet TAKE 1 TABLET BY MOUTH EVERY 6 HOURS AS NEEDED FOR MUSCLE SPASMS. 40 tablet 0   Multiple Vitamin (MULTIVITAMIN) tablet Take 1 tablet by mouth daily.     omeprazole (PRILOSEC) 20 MG capsule TAKE 1 CAPSULE BY MOUTH EVERY DAY 90 capsule 3   osimertinib mesylate (TAGRISSO) 80 MG tablet Take 1 tablet (80 mg total) by mouth daily. 30 tablet 3   Probiotic Product (PROBIOTIC PO) Take 1 capsule by mouth daily.      VITAMIN D PO Take by mouth daily.     No current facility-administered medications for this visit.     Past Medical History:  Diagnosis Date   Arthralgia of multiple joints    followed by dr Gerilyn Nestle   Arthritis    Cardiomyopathy [facility])    Chronic constipation    Chronic inflammatory arthritis    rhemotolgist-  dr a. Gerilyn Nestle [facility])   Dry eyes    eye  drops used    GERD (gastroesophageal reflux disease)    H/O discoid lupus erythematosus    Hiatal hernia    History of colon polyps    Hypothyroidism    Iron deficiency anemia    LBBB (left bundle branch block) 2010   Mitchell's disease (erythromelalgia) [facility])    neurologist-  dr patel   Nocturia    Non-small cell cancer of right lung [facility]) surgeon-- dr gerhardt/  oncologist-  dr Julien Nordmann--- per lov notes no recurrence/   11-18-2017 per pt denies any symptoms   dx 2015--  Stage IIA (T2b,N0,M0) , +EGFR  mutation in exon 21, non-small cell adenocarcinoma right upper lobe---  s/p  Right upper lobectomy , right middley wedge resection and node dissection---   no chemo or radiation therapy   OA (osteoarthritis)    hands   Osteoporosis    PONV (postoperative nausea and vomiting)    likes phenergan   Raynaud's phenomenon 1965   Renal insufficiency    Rheumatoid arthritis ([place])    Sciatica    Scoliosis    Sjogren's syndrome [facility])     Past Surgical History:  Procedure Laterality Date   ANTERIOR HIP REVISION Right 11/27/2017   Procedure: RIGHT HIP ACETABULAR REVISION;  Surgeon: [name], MD;  Location: [facility];  Service: Orthopedics;  Laterality: Right;   ANTERIOR HIP REVISION Right 01/24/2018   Procedure: OPEN REDUCTION OF DISLOCATED ANTERIOR HIP WITH REVISION OF LINER AND HIP BALL;  Surgeon: [name], MD;  Location: [facility];  Service: Orthopedics;  Laterality: Right;   APPENDECTOMY  1950s   BIOPSY  04/14/2018   Procedure: BIOPSY;  Surgeon: Yetta Flock, MD;  Location: [place];  Service: Gastroenterology;;   BIOPSY  04/16/2018   Procedure: BIOPSY;  Surgeon: Irving Copas., MD;  Location: [place];  Service: Gastroenterology;;   CARDIOVASCULAR STRESS TEST  12/2008    mild fixed basal to mid septal perfusion defect felt likely due to artifact from LBBB, no ischemia, EF 58%   COLONOSCOPY     COLONOSCOPY WITH PROPOFOL N/A 04/16/2018   Procedure: COLONOSCOPY WITH PROPOFOL;  Surgeon: Irving Copas., MD;  Location: [place];  Service: Gastroenterology;  Laterality: N/A;   ESOPHAGOGASTRODUODENOSCOPY (EGD) WITH PROPOFOL N/A 04/14/2018   Procedure: ESOPHAGOGASTRODUODENOSCOPY (EGD) WITH PROPOFOL;  Surgeon: Yetta Flock, MD;  Location: [place];  Service: Gastroenterology;  Laterality: N/A;   FEMORAL-POPLITEAL BYPASS GRAFT Right 04/10/2018   Procedure: REPAIR RIGHT FEMORAL ARTERY PSEUDOANEURYSM, RETROPERITONEAL EXPOSURE OF ILIAC ARTERY, RIGHT POPLITEAL EMBOLECTOMY;  Surgeon: Angelia Mould, MD;  Location: [place];  Service: Vascular;  Laterality: Right;   FLEXIBLE  SIGMOIDOSCOPY N/A 06/17/2019   Procedure: FLEXIBLE SIGMOIDOSCOPY;  Surgeon: Lavena Bullion, DO;  [facility];  Service: Gastroenterology;  Laterality: N/A;   HEMOSTASIS CLIP PLACEMENT  06/17/2019   Procedure: HEMOSTASIS CLIP PLACEMENT;  Surgeon: Lavena Bullion, DO;  [facility];  Service: Gastroenterology;;   LYMPH NODE DISSECTION Right 06/07/2013   Procedure: LYMPH NODE DISSECTION;  Surgeon: [name], MD;  Location: [place];  Service: Thoracic;  Laterality: Right;   PATCH ANGIOPLASTY Right 04/10/2018   Procedure: PATCH  ANGIOPLASTY OF RIGHT FEMORAL ARTERY USING BOVINE PATCH, PATCH ANGIOPLASTY OF RIGHT POPLITEAL ARTERY USING BOVINE PATCH;  Surgeon: Angelia Mould, MD;  Location: [place];  Service: Vascular;  Laterality: Right;   SCHLEROTHERAPY  06/17/2019   Procedure: [place] Ku OF VARICES;  Surgeon: Lavena Bullion, DO;  [facility];  Service: Gastroenterology;;   [place]   "large incision from chest to up to shoulder, the nerves were tied together, for raynaud's   THORACOTOMY  06/07/2013   Procedure: MINI/LIMITED THORACOTOMY; right middle lobe wedge resection;  Surgeon: [name], MD;  Location: [place];  Service: Thoracic;;   TONSILLECTOMY  child   TOTAL ABDOMINAL HYSTERECTOMY  1980's    W/ BSO   TOTAL HIP ARTHROPLASTY Right 04/28/2014   Procedure: RIGHT TOTAL HIP ARTHROPLASTY ANTERIOR APPROACH;  Surgeon: [name], MD;  [facility];  Service: Orthopedics;  Laterality: Right;   TRANSTHORACIC ECHOCARDIOGRAM  12/11/2008   ef 29-24%, grade 1 diastolic dysfunction/  mild LAE/  mild AR and MR/  trivial TR   VIDEO ASSISTED THORACOSCOPY (VATS)/WEDGE RESECTION Right 06/07/2013   Procedure: VIDEO ASSISTED THORACOSCOPY (VATS)/right upper lobectomy, On Q;  Surgeon: [name], MD;  Location: [place];  Service: Thoracic;  Laterality: Right;   VIDEO BRONCHOSCOPY N/A 06/07/2013   Procedure: VIDEO  BRONCHOSCOPY;  Surgeon: [name], MD;  Location: [place];  Service: Thoracic;  Laterality: N/A;   VIDEO BRONCHOSCOPY WITH ENDOBRONCHIAL NAVIGATION N/A 05/04/2013   Procedure: VIDEO BRONCHOSCOPY WITH ENDOBRONCHIAL NAVIGATION;  Surgeon: [name], MD;  Location: [place];  Service: Thoracic;  Laterality: N/A;    Social History   Socioeconomic History   Marital status: Widowed    Spouse name: Not on file   Number of children: 2   Years of education: Not on file   Highest education level: Not on file  Occupational History   Occupation: n/a  Tobacco Use   Smoking status: Never    Passive exposure: Past   Smokeless tobacco: Never  Vaping Use   Vaping Use: Never used  Substance and Sexual Activity   Alcohol use: Not Currently   Drug use: Never   Sexual activity: Not Currently    Birth control/protection: Surgical  Other Topics Concern   Not on file  Social History Narrative   Lives with husband, daughter and grandchild local.   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   Social Determinants of Health   Financial Resource Strain: [place]  (05/07/2021)   Overall Financial Resource Strain (CARDIA)    Difficulty of Paying Living Expenses: Not hard at all  Food Insecurity: No Food Insecurity (05/07/2021)   Hunger Vital Sign    Worried About Running Out of Food in the Last Year: Never true    [place] in the Last Year: Never true  Transportation Needs: No Transportation Needs (05/07/2021)   PRAPARE - Hydrologist (Medical): No    Lack of Transportation (Non-Medical): No  Physical Activity: Insufficiently Active (05/07/2021)   Exercise Vital Sign    Days of Exercise per Week: 3 days    Minutes of Exercise per Session: 30 min  Stress: No Stress Concern Present (05/07/2021)   [place]    Feeling of Stress : Not at all  Social Connections:  Moderately Integrated (05/07/2021)   Social Connection and Isolation Panel [NHANES]    Frequency of Communication with Friends and Family: More than three times a week    Frequency of Social Gatherings with Friends and Family: More than three times a week    Attends Religious Services: More than 4 times per year    Active Member of [employer] or Organizations: Yes  Attends Archivist Meetings: 1 to 4 times per year    Marital Status: Widowed  Intimate Partner Violence: Not At Risk (05/07/2021)   Humiliation, Afraid, Rape, and Kick questionnaire    Fear of Current or Ex-Partner: No    Emotionally Abused: No    Physically Abused: No    Sexually Abused: No    Family History  Problem Relation Age of Onset   Coronary artery disease Father    Colon cancer Father    Diabetes Father    Cancer Father        colon   Other Mother 51       MVA   Healthy Sister    Healthy Brother    Healthy Daughter    Hypothyroidism Daughter    Other Brother        killed in war   Pneumonia Sister    Healthy Daughter    Esophageal cancer Neg Hx    Kidney disease Neg Hx    Liver disease Neg Hx     ROS: no fevers or chills, productive cough, hemoptysis, dysphasia, odynophagia, melena, hematochezia, dysuria, hematuria, rash, seizure activity, orthopnea, PND, pedal edema, claudication. Remaining systems are negative.  Physical Exam: Well-developed frail in no acute distress.  Skin is warm and dry.  HEENT is normal.  Neck is supple.  Chest is clear to auscultation with normal expansion.  Cardiovascular exam is regular rate and rhythm.  Abdominal exam nontender or distended. No masses palpated. Extremities show trace edema. neuro grossly intact   A/P  1 cardiomyopathy-possibly secondary to dyssynchrony.  We will continue beta-blocker at present dose.  I have elected to be conservative in evaluation given patient's age and ongoing lung cancer.  2 chronic combined systolic/diastolic  congestive heart failure-she appears to be euvolemic.  Continue Lasix at present dose.  She is presently taking Lasix every other or every third day.  3 palpitations-continue beta-blocker.  4 left bundle branch block  5 lung cancer-managed by oncology.  She has been initiated on Tagrisso and is tolerating well.  Kirk Ruths, MD

## 2021-11-25 ENCOUNTER — Telehealth: Payer: Self-pay | Admitting: Internal Medicine

## 2021-11-25 NOTE — Telephone Encounter (Signed)
Called patient regarding upcoming October appointment, patient is notified.   

## 2021-11-26 ENCOUNTER — Other Ambulatory Visit (HOSPITAL_COMMUNITY): Payer: Self-pay

## 2021-11-28 ENCOUNTER — Other Ambulatory Visit (HOSPITAL_COMMUNITY): Payer: Self-pay

## 2021-11-28 ENCOUNTER — Other Ambulatory Visit: Payer: Medicare PPO

## 2021-11-28 DIAGNOSIS — R1032 Left lower quadrant pain: Secondary | ICD-10-CM | POA: Diagnosis not present

## 2021-11-28 MED ORDER — CEPHALEXIN 500 MG PO CAPS: 500.0000 mg | ORAL_CAPSULE | Freq: Two times a day (BID) | ORAL | 0 refills | Status: DC

## 2021-11-28 NOTE — Addendum Note (Signed)
Addended by: Lamar Blinks C on: 11/28/2021 03:03 PM   Modules accepted: Orders

## 2021-12-01 ENCOUNTER — Encounter: Payer: Self-pay | Admitting: Family Medicine

## 2021-12-01 LAB — URINE CULTURE
MICRO NUMBER:: 14042882
SPECIMEN QUALITY:: ADEQUATE

## 2021-12-03 ENCOUNTER — Inpatient Hospital Stay (HOSPITAL_BASED_OUTPATIENT_CLINIC_OR_DEPARTMENT_OTHER): Payer: Medicare PPO | Admitting: Internal Medicine

## 2021-12-03 ENCOUNTER — Inpatient Hospital Stay: Payer: Medicare PPO | Attending: Internal Medicine

## 2021-12-03 VITALS — BP 154/85 | HR 88 | Temp 98.3°F | Resp 16 | Wt 110.8 lb

## 2021-12-03 DIAGNOSIS — C3411 Malignant neoplasm of upper lobe, right bronchus or lung: Secondary | ICD-10-CM | POA: Diagnosis not present

## 2021-12-03 DIAGNOSIS — C3410 Malignant neoplasm of upper lobe, unspecified bronchus or lung: Secondary | ICD-10-CM

## 2021-12-03 DIAGNOSIS — D696 Thrombocytopenia, unspecified: Secondary | ICD-10-CM | POA: Insufficient documentation

## 2021-12-03 DIAGNOSIS — D649 Anemia, unspecified: Secondary | ICD-10-CM | POA: Insufficient documentation

## 2021-12-03 LAB — CBC WITH DIFFERENTIAL (CANCER CENTER ONLY)
Abs Immature Granulocytes: 0.02 10*3/uL (ref 0.00–0.07)
Basophils Absolute: 0 10*3/uL (ref 0.0–0.1)
Basophils Relative: 1 %
Eosinophils Absolute: 0.1 10*3/uL (ref 0.0–0.5)
Eosinophils Relative: 2 %
HCT: 34.3 % — ABNORMAL LOW (ref 36.0–46.0)
Hemoglobin: 11.3 g/dL — ABNORMAL LOW (ref 12.0–15.0)
Immature Granulocytes: 0 %
Lymphocytes Relative: 23 %
Lymphs Abs: 1.2 10*3/uL (ref 0.7–4.0)
MCH: 29.8 pg (ref 26.0–34.0)
MCHC: 32.9 g/dL (ref 30.0–36.0)
MCV: 90.5 fL (ref 80.0–100.0)
Monocytes Absolute: 0.4 10*3/uL (ref 0.1–1.0)
Monocytes Relative: 9 %
Neutro Abs: 3.4 10*3/uL (ref 1.7–7.7)
Neutrophils Relative %: 65 %
Platelet Count: 140 10*3/uL — ABNORMAL LOW (ref 150–400)
RBC: 3.79 MIL/uL — ABNORMAL LOW (ref 3.87–5.11)
RDW: 13.2 % (ref 11.5–15.5)
WBC Count: 5.1 10*3/uL (ref 4.0–10.5)
nRBC: 0 % (ref 0.0–0.2)

## 2021-12-03 LAB — CMP (CANCER CENTER ONLY)
ALT: 12 U/L (ref 0–44)
AST: 25 U/L (ref 15–41)
Albumin: 4.4 g/dL (ref 3.5–5.0)
Alkaline Phosphatase: 41 U/L (ref 38–126)
Anion gap: 5 (ref 5–15)
BUN: 34 mg/dL — ABNORMAL HIGH (ref 8–23)
CO2: 31 mmol/L (ref 22–32)
Calcium: 9.6 mg/dL (ref 8.9–10.3)
Chloride: 91 mmol/L — ABNORMAL LOW (ref 98–111)
Creatinine: 1.41 mg/dL — ABNORMAL HIGH (ref 0.44–1.00)
GFR, Estimated: 36 mL/min — ABNORMAL LOW (ref >60)
Glucose, Bld: 128 mg/dL — ABNORMAL HIGH (ref 70–99)
Potassium: 5.1 mmol/L (ref 3.5–5.1)
Sodium: 127 mmol/L — ABNORMAL LOW (ref 135–145)
Total Bilirubin: 0.3 mg/dL (ref 0.3–1.2)
Total Protein: 7.3 g/dL (ref 6.5–8.1)

## 2021-12-03 NOTE — Progress Notes (Signed)
[place] [phone]   [phone]  OFFICE PROGRESS NOTE  Copland, Gay Filler, MD [address] [place] [address]  DIAGNOSIS: Multifocal non-small cell lung cancer, adenocarcinoma initially diagnosed as stage IIA (T2b, N0, M0) non-small cell lung cancer, adenocarcinoma with positive EGFR mutation in exon 21 ([id number]) presented with right upper lobe lung mass diagnosed in March of 2015.  PRIOR THERAPY: Status post right upper lobectomy with wedge resection of the right middle lobe under the care of Dr. Servando Snare on 06/07/2013  CURRENT THERAPY: Tagrisso 80 mg p.o. daily started November 19, 2021.  Status post 2 weeks of treatment.  INTERVAL HISTORY: [name] 88 y.o. female returns to the clinic today for follow-up visit.  The patient is feeling fine today with no concerning complaints.  She tolerated the first 2 weeks of her treatment with Tagrisso fairly well.  She denied having any current nausea, vomiting, diarrhea or constipation.  She has no headache or visual changes.  She has no recent weight loss or night sweats.  She has no headache or visual changes.  She is here today for evaluation and repeat blood work.   MEDICAL HISTORY: Past Medical History:  Diagnosis Date   Arthralgia of multiple joints    followed by dr Gerilyn Nestle   Arthritis    Cardiomyopathy ([place])    Chronic constipation    Chronic inflammatory arthritis    rhemotolgist-  dr a. Gerilyn Nestle ([facility])   Dry eyes    eye drops used    GERD (gastroesophageal reflux disease)    H/O discoid lupus erythematosus    Hiatal hernia    History of colon polyps    Hypothyroidism    Iron deficiency anemia    LBBB (left bundle branch block) 2010   Mitchell's disease (erythromelalgia) [facility])    neurologist-  dr patel   Nocturia    Non-small cell cancer of right lung [facility]) surgeon-- dr gerhardt/  oncologist-  dr Julien Nordmann--- per lov notes no recurrence/    11-18-2017 per pt denies any symptoms   dx 2015--  Stage IIA (T2b,N0,M0) , +EGFR  mutation in exon 21, non-small cell adenocarcinoma right upper lobe---  s/p  Right upper lobectomy , right middley wedge resection and node dissection---  no chemo or radiation therapy   OA (osteoarthritis)    hands   Osteoporosis    PONV (postoperative nausea and vomiting)    likes phenergan   Raynaud's phenomenon 1965   Renal insufficiency    Rheumatoid arthritis ([place])    Sciatica    Scoliosis    Sjogren's syndrome ([place])     ALLERGIES:  is allergic to amlodipine, prochlorperazine edisylate, aspirin, cymbalta [duloxetine hcl], and pamelor [nortriptyline hcl].  MEDICATIONS:  Current Outpatient Medications  Medication Sig Dispense Refill   Abatacept (ORENCIA IV) Inject into the vein every 28 (twenty-eight) days.     Acetaminophen (TYLENOL EXTRA STRENGTH PO) Take 1-2 tablets by mouth every 6 (six) hours as needed (pain).      Biotin 1000 MCG tablet Take 1,000 mcg by mouth daily.     Calcium Carbonate-Vitamin D 500-125 MG-UNIT TABS Take 1 tablet by mouth daily.     carvedilol (COREG) 3.125 MG tablet Take 1 tablet (3.125 mg total) by mouth 2 (two) times daily with a meal. 180 tablet 3   cephALEXin (KEFLEX) 500 MG capsule Take 1 capsule (500 mg total) by mouth 2 (two) times daily. [place]  capsule 0   denosumab (PROLIA) 60 MG/ML SOSY injection Inject 60 mg into the skin every 6 (six) months.     docusate sodium (COLACE) 100 MG capsule Take 100 mg by mouth 2 (two) times daily.     estradiol (ESTRACE VAGINAL) 0.1 MG/GM vaginal cream Place one gram vaginally up to three times a week as needed to maintain comfort 42.5 g 12   furosemide (LASIX) 20 MG tablet Take 1 tablet (20 mg total) by mouth every other day. 45 tablet 3   gabapentin (NEURONTIN) 300 MG capsule TAKE 1 CAPSULE BY MOUTH IN THE MORNING, 1 IN THE AFTERNOON, AND 2 AT BEDTIME 360 capsule 1   Glucosamine-Chondroit-Vit C-Mn (GLUCOSAMINE CHONDR 1500 COMPLX PO)  Take 1 capsule by mouth daily.     HYDROcodone-acetaminophen (NORCO/VICODIN) 5-325 MG tablet Take 0.5-1 tablets by mouth every 8 (eight) hours as needed. 30 tablet 0   levothyroxine (SYNTHROID) 75 MCG tablet Take 1 tablet (75 mcg total) by mouth daily before breakfast. 90 tablet 1   methocarbamol (ROBAXIN) 500 MG tablet TAKE 1 TABLET BY MOUTH EVERY 6 HOURS AS NEEDED FOR MUSCLE SPASMS. 40 tablet 0   Multiple Vitamin (MULTIVITAMIN) tablet Take 1 tablet by mouth daily.     omeprazole (PRILOSEC) 20 MG capsule TAKE 1 CAPSULE BY MOUTH EVERY DAY 90 capsule 3   osimertinib mesylate (TAGRISSO) 80 MG tablet Take 1 tablet (80 mg total) by mouth daily. 30 tablet 3   Probiotic Product (PROBIOTIC PO) Take 1 capsule by mouth daily.      VITAMIN D PO Take by mouth daily.     No current facility-administered medications for this visit.    SURGICAL HISTORY:  Past Surgical History:  Procedure Laterality Date   ANTERIOR HIP REVISION Right 11/27/2017   Procedure: RIGHT HIP ACETABULAR REVISION;  Surgeon: [name], MD;  Location: [facility];  Service: Orthopedics;  Laterality: Right;   ANTERIOR HIP REVISION Right 01/24/2018   Procedure: OPEN REDUCTION OF DISLOCATED ANTERIOR HIP WITH REVISION OF LINER AND HIP BALL;  Surgeon: [name], MD;  Location: [facility];  Service: Orthopedics;  Laterality: Right;   APPENDECTOMY  1950s   BIOPSY  04/14/2018   Procedure: BIOPSY;  Surgeon: Yetta Flock, MD;  Location: [place];  Service: Gastroenterology;;   BIOPSY  04/16/2018   Procedure: BIOPSY;  Surgeon: Irving Copas., MD;  Location: [place];  Service: Gastroenterology;;   CARDIOVASCULAR STRESS TEST  12/2008    mild fixed basal to mid septal perfusion defect felt likely due to artifact from LBBB, no ischemia, EF 58%   COLONOSCOPY     COLONOSCOPY WITH PROPOFOL N/A 04/16/2018   Procedure: COLONOSCOPY WITH PROPOFOL;  Surgeon: Irving Copas., MD;  Location: [place];   Service: Gastroenterology;  Laterality: N/A;   ESOPHAGOGASTRODUODENOSCOPY (EGD) WITH PROPOFOL N/A 04/14/2018   Procedure: ESOPHAGOGASTRODUODENOSCOPY (EGD) WITH PROPOFOL;  Surgeon: Yetta Flock, MD;  Location: [place];  Service: Gastroenterology;  Laterality: N/A;   FEMORAL-POPLITEAL BYPASS GRAFT Right 04/10/2018   Procedure: REPAIR RIGHT FEMORAL ARTERY PSEUDOANEURYSM, RETROPERITONEAL EXPOSURE OF ILIAC ARTERY, RIGHT POPLITEAL EMBOLECTOMY;  Surgeon: Angelia Mould, MD;  Location: [place];  Service: Vascular;  Laterality: Right;   FLEXIBLE SIGMOIDOSCOPY N/A 06/17/2019   Procedure: FLEXIBLE SIGMOIDOSCOPY;  Surgeon: Lavena Bullion, DO;  [facility];  Service: Gastroenterology;  Laterality: N/A;   HEMOSTASIS CLIP PLACEMENT  06/17/2019   Procedure: HEMOSTASIS CLIP PLACEMENT;  Surgeon: Lavena Bullion, DO;  [facility];  Service: Gastroenterology;;   LYMPH NODE DISSECTION Right 06/07/2013   Procedure: LYMPH NODE DISSECTION;  Surgeon: [name], MD;  Location: [place];  Service: Thoracic;  Laterality: Right;   PATCH ANGIOPLASTY Right 04/10/2018   Procedure: PATCH  ANGIOPLASTY OF RIGHT FEMORAL ARTERY USING BOVINE PATCH, PATCH ANGIOPLASTY OF RIGHT POPLITEAL ARTERY USING BOVINE PATCH;  Surgeon: Angelia Mould, MD;  Location: [place];  Service: Vascular;  Laterality: Right;   SCHLEROTHERAPY  06/17/2019   Procedure: [place] Ku OF VARICES;  Surgeon: Lavena Bullion, DO;  [facility];  Service: Gastroenterology;;   [place]   "large incision from chest to up to shoulder, the nerves were tied together, for raynaud's   THORACOTOMY  06/07/2013   Procedure: MINI/LIMITED THORACOTOMY; right middle lobe wedge resection;  Surgeon: [name], MD;  Location: [place];  Service: Thoracic;;   TONSILLECTOMY  child   TOTAL ABDOMINAL HYSTERECTOMY  1980's    W/ BSO   TOTAL HIP ARTHROPLASTY Right 04/28/2014   Procedure: RIGHT TOTAL  HIP ARTHROPLASTY ANTERIOR APPROACH;  Surgeon: [name], MD;  [facility];  Service: Orthopedics;  Laterality: Right;   TRANSTHORACIC ECHOCARDIOGRAM  12/11/2008   ef 51-89%, grade 1 diastolic dysfunction/  mild LAE/  mild AR and MR/  trivial TR   VIDEO ASSISTED THORACOSCOPY (VATS)/WEDGE RESECTION Right 06/07/2013   Procedure: VIDEO ASSISTED THORACOSCOPY (VATS)/right upper lobectomy, On Q;  Surgeon: [name], MD;  Location: [place];  Service: Thoracic;  Laterality: Right;   VIDEO BRONCHOSCOPY N/A 06/07/2013   Procedure: VIDEO BRONCHOSCOPY;  Surgeon: [name], MD;  Location: [place];  Service: Thoracic;  Laterality: N/A;   VIDEO BRONCHOSCOPY WITH ENDOBRONCHIAL NAVIGATION N/A 05/04/2013   Procedure: VIDEO BRONCHOSCOPY WITH ENDOBRONCHIAL NAVIGATION;  Surgeon: [name], MD;  Location: [place];  Service: Thoracic;  Laterality: N/A;    REVIEW OF SYSTEMS:  A comprehensive review of systems was negative.   PHYSICAL EXAMINATION: General appearance: alert, cooperative, and no distress Head: Normocephalic, without obvious abnormality, atraumatic Neck: no adenopathy, no JVD, supple, symmetrical, trachea midline, and thyroid not enlarged, symmetric, no tenderness/mass/nodules Lymph nodes: Cervical, supraclavicular, and axillary nodes normal. Resp: clear to auscultation bilaterally Back: symmetric, no curvature. ROM normal. No CVA tenderness. Cardio: regular rate and rhythm, S1, S2 normal, no murmur, click, rub or gallop GI: soft, non-tender; bowel sounds normal; no masses,  no organomegaly Extremities: extremities normal, atraumatic, no cyanosis or edema  ECOG PERFORMANCE STATUS: 1 - Symptomatic but completely ambulatory  Blood pressure (!) 154/85, pulse 88, temperature 98.3 F (36.8 C), temperature source Oral, resp. rate 16, weight 110 lb 12.8 oz (50.3 kg), SpO2 98 %.  LABORATORY DATA: Lab Results  Component Value Date   WBC 5.1 12/03/2021   HGB 11.3 (L)  12/03/2021   HCT 34.3 (L) 12/03/2021   MCV 90.5 12/03/2021   PLT 140 (L) 12/03/2021      Chemistry      Component Value Date/Time   NA 133 (L) 10/09/2021 1108   NA 130 (L) 04/26/2021 0000   NA 130 (L) 08/21/2016 1125   K 5.2 (H) 10/09/2021 1108   K 4.8 08/21/2016 1125   CL 100 10/09/2021 1108   CO2 28 10/09/2021 1108   CO2 26 08/21/2016 1125   BUN 26 (H) 10/09/2021 1108   BUN 27 04/26/2021 0000   BUN 25.6 08/21/2016 1125   CREATININE 1.05 (H) 10/09/2021 1108   CREATININE 1.14 (H) 09/27/2021 1510  CREATININE 0.98 (H) 05/21/2020 1341   CREATININE 1.2 (H) 08/21/2016 1125      Component Value Date/Time   CALCIUM 9.3 10/09/2021 1108   CALCIUM 10.0 08/21/2016 1125   ALKPHOS 39 10/09/2021 1108   ALKPHOS 54 08/21/2016 1125   AST 30 10/09/2021 1108   AST 27 09/27/2021 1510   AST 27 08/21/2016 1125   ALT 13 10/09/2021 1108   ALT 16 09/27/2021 1510   ALT 13 08/21/2016 1125   BILITOT 0.3 10/09/2021 1108   BILITOT 0.4 09/27/2021 1510   BILITOT 0.31 08/21/2016 1125       RADIOGRAPHIC STUDIES: No results found.  ASSESSMENT AND PLAN:  This is a very pleasant 88 years old white female with multifocal bilateral adenocarcinoma initially diagnosed as stage IIA non-small cell lung cancer, adenocarcinoma with positive EGFR mutation in exon 21 status post right upper lobectomy as well as wedge resection of the right middle lobe in March 2015 and the patient declined adjuvant systemic chemotherapy. The patient had recent imaging studies that showed increase in the size and number of multifocal disease. She is currently undergoing treatment with Tagrisso 80 mg p.o. daily started on November 19, 2021, status post 2 weeks of treatment. She has been tolerating her treatment well with no concerning adverse effects. I recommended for the patient to continue her treatment with Tagrisso with the same dose. CBC today is unremarkable except for the mild anemia and thrombocytopenia.  Comprehensive  metabolic panel is still pending I will see her back for follow-up visit in 2 weeks for evaluation and repeat blood work. She was advised to call immediately if she has any other concerning symptoms in the interval.  The patient voices understanding of current disease status and treatment options and is in agreement with the current care plan. All questions were answered. The patient knows to call the clinic with any problems, questions or concerns. We can certainly see the patient much sooner if necessary. The total time spent in the appointment was 20 minutes.  Disclaimer: This note was dictated with voice recognition software. Similar sounding words can inadvertently be transcribed and may not be corrected upon review.

## 2021-12-04 ENCOUNTER — Ambulatory Visit: Payer: Medicare PPO | Attending: Cardiology | Admitting: Cardiology

## 2021-12-04 ENCOUNTER — Inpatient Hospital Stay (HOSPITAL_COMMUNITY): Admission: RE | Admit: 2021-12-04 | Payer: Medicare PPO | Source: Ambulatory Visit

## 2021-12-04 ENCOUNTER — Other Ambulatory Visit (HOSPITAL_COMMUNITY): Payer: Self-pay

## 2021-12-04 ENCOUNTER — Encounter: Payer: Self-pay | Admitting: Cardiology

## 2021-12-04 VITALS — BP 108/52 | HR 84 | Ht 60.0 in | Wt 108.0 lb

## 2021-12-04 DIAGNOSIS — I420 Dilated cardiomyopathy: Secondary | ICD-10-CM | POA: Diagnosis not present

## 2021-12-04 DIAGNOSIS — I5040 Unspecified combined systolic (congestive) and diastolic (congestive) heart failure: Secondary | ICD-10-CM

## 2021-12-04 DIAGNOSIS — R002 Palpitations: Secondary | ICD-10-CM

## 2021-12-04 MED ORDER — CARVEDILOL 3.125 MG PO TABS: 3.1250 mg | ORAL_TABLET | Freq: Two times a day (BID) | ORAL | 3 refills | Status: DC

## 2021-12-04 NOTE — Patient Instructions (Signed)
  Follow-Up: At [facility], you and your health needs are our priority.  As part of our continuing mission to provide you with exceptional heart care, we have created designated Provider Care Teams.  These Care Teams include your primary Cardiologist (physician) and Advanced Practice Providers (APPs -  Physician Assistants and Nurse Practitioners) who all work together to provide you with the care you need, when you need it.  We recommend signing up for the patient portal called "MyChart".  Sign up information is provided on this After Visit Summary.  MyChart is used to connect with patients for Virtual Visits (Telemedicine).  Patients are able to view lab/test results, encounter notes, upcoming appointments, etc.  Non-urgent messages can be sent to your provider as well.   To learn more about what you can do with MyChart, go to [email or web address].    Your next appointment:   6 month(s)  The format for your next appointment:   In Person  Provider:   Brian Crenshaw, MD    

## 2021-12-09 ENCOUNTER — Other Ambulatory Visit (HOSPITAL_COMMUNITY): Payer: Self-pay

## 2021-12-10 ENCOUNTER — Telehealth: Payer: Self-pay | Admitting: Pharmacist

## 2021-12-10 DIAGNOSIS — M0609 Rheumatoid arthritis without rheumatoid factor, multiple sites: Secondary | ICD-10-CM

## 2021-12-10 DIAGNOSIS — Z79899 Other long term (current) drug therapy: Secondary | ICD-10-CM

## 2021-12-10 NOTE — Telephone Encounter (Signed)
Patient due for next Prolia on 01/06/22. She has an upcoming infusion on 12/25/21 for Orencia. After this infusion, her next dose will be due 01/22/22. Can receive Prolia on the same day as that infusion.  Reordered CBC and CMP Medical Day orders to be drawn in November 2023 so she can receive then  Knox Saliva, PharmD, MPH, BCPS, CPP Clinical Pharmacist (Rheumatology and Pulmonology)

## 2021-12-12 ENCOUNTER — Telehealth: Payer: Self-pay | Admitting: Medical Oncology

## 2021-12-12 NOTE — Telephone Encounter (Signed)
Constipation x 3-4 days. Hx internal hemorrhoid surgery 2 years ago ( Dr. Bryan Lemma).  Jorge started Tagrisso two weeks and has been having normal stools.  Sephira took a Dulcolax supp 3 days ago with "watery results." She still feels constipated. She stated she knows her food is still in her stomach and stool in her colon that she cannot empty .   Today she took one suppository early this am and another suppository this afternoon and "it did not stay in".   She ate 2 prunes and some fresh pineapple without results. Her stomach feels full . She noted a " very small amt of blood" on the tissue paper.  I suggested to take one dose of Miralax ( which she has on hand) tonight before bed or in the am and stay hydrated .   She was instructed to go to ED if she experiences any sudden abdominal pain , increased bleeding or any other concerns. She voiced understanding.

## 2021-12-13 ENCOUNTER — Telehealth: Payer: Self-pay

## 2021-12-13 NOTE — Telephone Encounter (Signed)
Called pt to f/u on constipation concern. Patient had BM today. No other concerns voiced.

## 2021-12-16 ENCOUNTER — Encounter: Payer: Self-pay | Admitting: Internal Medicine

## 2021-12-19 ENCOUNTER — Inpatient Hospital Stay: Payer: Medicare PPO | Attending: Internal Medicine | Admitting: Internal Medicine

## 2021-12-19 ENCOUNTER — Inpatient Hospital Stay: Payer: Medicare PPO

## 2021-12-19 VITALS — BP 151/85 | HR 94 | Temp 98.2°F | Resp 16 | Ht 60.0 in | Wt 111.5 lb

## 2021-12-19 DIAGNOSIS — C3410 Malignant neoplasm of upper lobe, unspecified bronchus or lung: Secondary | ICD-10-CM

## 2021-12-19 DIAGNOSIS — R059 Cough, unspecified: Secondary | ICD-10-CM | POA: Diagnosis not present

## 2021-12-19 DIAGNOSIS — C3490 Malignant neoplasm of unspecified part of unspecified bronchus or lung: Secondary | ICD-10-CM | POA: Diagnosis not present

## 2021-12-19 DIAGNOSIS — C3411 Malignant neoplasm of upper lobe, right bronchus or lung: Secondary | ICD-10-CM | POA: Diagnosis not present

## 2021-12-19 DIAGNOSIS — E871 Hypo-osmolality and hyponatremia: Secondary | ICD-10-CM | POA: Diagnosis not present

## 2021-12-19 DIAGNOSIS — R21 Rash and other nonspecific skin eruption: Secondary | ICD-10-CM | POA: Insufficient documentation

## 2021-12-19 DIAGNOSIS — R0902 Hypoxemia: Secondary | ICD-10-CM | POA: Diagnosis not present

## 2021-12-19 DIAGNOSIS — R0789 Other chest pain: Secondary | ICD-10-CM | POA: Diagnosis not present

## 2021-12-19 DIAGNOSIS — D649 Anemia, unspecified: Secondary | ICD-10-CM | POA: Insufficient documentation

## 2021-12-19 LAB — CBC WITH DIFFERENTIAL (CANCER CENTER ONLY)
Abs Immature Granulocytes: 0.05 10*3/uL (ref 0.00–0.07)
Basophils Absolute: 0 10*3/uL (ref 0.0–0.1)
Basophils Relative: 1 %
Eosinophils Absolute: 0.1 10*3/uL (ref 0.0–0.5)
Eosinophils Relative: 1 %
HCT: 33 % — ABNORMAL LOW (ref 36.0–46.0)
Hemoglobin: 11 g/dL — ABNORMAL LOW (ref 12.0–15.0)
Immature Granulocytes: 1 %
Lymphocytes Relative: 15 %
Lymphs Abs: 1 10*3/uL (ref 0.7–4.0)
MCH: 30 pg (ref 26.0–34.0)
MCHC: 33.3 g/dL (ref 30.0–36.0)
MCV: 89.9 fL (ref 80.0–100.0)
Monocytes Absolute: 0.5 10*3/uL (ref 0.1–1.0)
Monocytes Relative: 9 %
Neutro Abs: 4.6 10*3/uL (ref 1.7–7.7)
Neutrophils Relative %: 73 %
Platelet Count: 152 10*3/uL (ref 150–400)
RBC: 3.67 MIL/uL — ABNORMAL LOW (ref 3.87–5.11)
RDW: 13.1 % (ref 11.5–15.5)
WBC Count: 6.2 10*3/uL (ref 4.0–10.5)
nRBC: 0 % (ref 0.0–0.2)

## 2021-12-19 LAB — CMP (CANCER CENTER ONLY)
ALT: 13 U/L (ref 0–44)
AST: 29 U/L (ref 15–41)
Albumin: 4.2 g/dL (ref 3.5–5.0)
Alkaline Phosphatase: 31 U/L — ABNORMAL LOW (ref 38–126)
Anion gap: 6 (ref 5–15)
BUN: 26 mg/dL — ABNORMAL HIGH (ref 8–23)
CO2: 27 mmol/L (ref 22–32)
Calcium: 9.2 mg/dL (ref 8.9–10.3)
Chloride: 93 mmol/L — ABNORMAL LOW (ref 98–111)
Creatinine: 1.1 mg/dL — ABNORMAL HIGH (ref 0.44–1.00)
GFR, Estimated: 48 mL/min — ABNORMAL LOW (ref >60)
Glucose, Bld: 87 mg/dL (ref 70–99)
Potassium: 5.1 mmol/L (ref 3.5–5.1)
Sodium: 126 mmol/L — ABNORMAL LOW (ref 135–145)
Total Bilirubin: 0.3 mg/dL (ref 0.3–1.2)
Total Protein: 7.2 g/dL (ref 6.5–8.1)

## 2021-12-19 NOTE — Progress Notes (Signed)
[facility] [phone]   [phone]  OFFICE PROGRESS NOTE  Copland, Gay Filler, MD [address] [place] [address]  DIAGNOSIS: Multifocal non-small cell lung cancer, adenocarcinoma initially diagnosed as stage IIA (T2b, N0, M0) non-small cell lung cancer, adenocarcinoma with positive EGFR mutation in exon 21 ([id number]) presented with right upper lobe lung mass diagnosed in March of 2015.  PRIOR THERAPY: Status post right upper lobectomy with wedge resection of the right middle lobe under the care of Dr. Servando Snare on 06/07/2013  CURRENT THERAPY: Tagrisso 80 mg p.o. daily started November 19, 2021.  Status post 4 weeks of treatment.  INTERVAL HISTORY: [name] 88 y.o. female returns to the clinic today for follow-up visit.  The patient is feeling fine today with no concerning complaints except for few blisters in her mouth as well as small crusts in her scalp.  She denied having any significant chest pain, shortness of breath, cough or hemoptysis.  She has no nausea, vomiting but has alternating episodes of diarrhea and constipation.  She has no recent weight loss or night sweats.  She continues to tolerate her treatment with Tagrisso fairly well.  He is here today for evaluation and repeat blood work.  MEDICAL HISTORY: Past Medical History:  Diagnosis Date   Arthralgia of multiple joints    followed by dr Gerilyn Nestle   Arthritis    Cardiomyopathy ([place])    Chronic constipation    Chronic inflammatory arthritis    rhemotolgist-  dr a. Gerilyn Nestle ([facility])   Dry eyes    eye drops used    GERD (gastroesophageal reflux disease)    H/O discoid lupus erythematosus    Hiatal hernia    History of colon polyps    Hypothyroidism    Iron deficiency anemia    LBBB (left bundle branch block) 2010   Mitchell's disease (erythromelalgia) [facility])    neurologist-  dr patel   Nocturia    Non-small cell cancer of right lung [facility])  surgeon-- dr gerhardt/  oncologist-  dr Julien Nordmann--- per lov notes no recurrence/   11-18-2017 per pt denies any symptoms   dx 2015--  Stage IIA (T2b,N0,M0) , +EGFR  mutation in exon 21, non-small cell adenocarcinoma right upper lobe---  s/p  Right upper lobectomy , right middley wedge resection and node dissection---  no chemo or radiation therapy   OA (osteoarthritis)    hands   Osteoporosis    PONV (postoperative nausea and vomiting)    likes phenergan   Raynaud's phenomenon 1965   Renal insufficiency    Rheumatoid arthritis ([place])    Sciatica    Scoliosis    Sjogren's syndrome ([place])     ALLERGIES:  is allergic to amlodipine, prochlorperazine edisylate, aspirin, cymbalta [duloxetine hcl], and pamelor [nortriptyline hcl].  MEDICATIONS:  Current Outpatient Medications  Medication Sig Dispense Refill   Abatacept (ORENCIA IV) Inject into the vein every 28 (twenty-eight) days.     Acetaminophen (TYLENOL EXTRA STRENGTH PO) Take 1-2 tablets by mouth every 6 (six) hours as needed (pain).      Biotin 1000 MCG tablet Take 1,000 mcg by mouth daily.     Calcium Carbonate-Vitamin D 500-125 MG-UNIT TABS Take 1 tablet by mouth daily.     carvedilol (COREG) 3.125 MG tablet Take 1 tablet (3.125 mg total) by mouth 2 (two) times daily with a meal. 180 tablet 3   cephALEXin (KEFLEX) 500 MG capsule Take  1 capsule (500 mg total) by mouth 2 (two) times daily. 14 capsule 0   denosumab (PROLIA) 60 MG/ML SOSY injection Inject 60 mg into the skin every 6 (six) months.     docusate sodium (COLACE) 100 MG capsule Take 100 mg by mouth 2 (two) times daily.     estradiol (ESTRACE VAGINAL) 0.1 MG/GM vaginal cream Place one gram vaginally up to three times a week as needed to maintain comfort 42.5 g 12   furosemide (LASIX) 20 MG tablet Take 1 tablet (20 mg total) by mouth every other day. 45 tablet 3   gabapentin (NEURONTIN) 300 MG capsule TAKE 1 CAPSULE BY MOUTH IN THE MORNING, 1 IN THE AFTERNOON, AND 2 AT BEDTIME 360  capsule 1   Glucosamine-Chondroit-Vit C-Mn (GLUCOSAMINE CHONDR 1500 COMPLX PO) Take 1 capsule by mouth daily.     HYDROcodone-acetaminophen (NORCO/VICODIN) 5-325 MG tablet Take 0.5-1 tablets by mouth every 8 (eight) hours as needed. 30 tablet 0   levothyroxine (SYNTHROID) 75 MCG tablet Take 1 tablet (75 mcg total) by mouth daily before breakfast. 90 tablet 1   losartan (COZAAR) 25 MG tablet Take 25 mg by mouth daily.     methocarbamol (ROBAXIN) 500 MG tablet TAKE 1 TABLET BY MOUTH EVERY 6 HOURS AS NEEDED FOR MUSCLE SPASMS. 40 tablet 0   Multiple Vitamin (MULTIVITAMIN) tablet Take 1 tablet by mouth daily.     omeprazole (PRILOSEC) 20 MG capsule TAKE 1 CAPSULE BY MOUTH EVERY DAY 90 capsule 3   osimertinib mesylate (TAGRISSO) 80 MG tablet Take 1 tablet (80 mg total) by mouth daily. 30 tablet 3   Probiotic Product (PROBIOTIC PO) Take 1 capsule by mouth daily.      VITAMIN D PO Take by mouth daily.     No current facility-administered medications for this visit.    SURGICAL HISTORY:  Past Surgical History:  Procedure Laterality Date   ANTERIOR HIP REVISION Right 11/27/2017   Procedure: RIGHT HIP ACETABULAR REVISION;  Surgeon: [name], MD;  Location: [facility];  Service: Orthopedics;  Laterality: Right;   ANTERIOR HIP REVISION Right 01/24/2018   Procedure: OPEN REDUCTION OF DISLOCATED ANTERIOR HIP WITH REVISION OF LINER AND HIP BALL;  Surgeon: [name], MD;  Location: [facility];  Service: Orthopedics;  Laterality: Right;   APPENDECTOMY  1950s   BIOPSY  04/14/2018   Procedure: BIOPSY;  Surgeon: Yetta Flock, MD;  Location: [place];  Service: Gastroenterology;;   BIOPSY  04/16/2018   Procedure: BIOPSY;  Surgeon: Irving Copas., MD;  Location: [place];  Service: Gastroenterology;;   CARDIOVASCULAR STRESS TEST  12/2008    mild fixed basal to mid septal perfusion defect felt likely due to artifact from LBBB, no ischemia, EF 58%   COLONOSCOPY      COLONOSCOPY WITH PROPOFOL N/A 04/16/2018   Procedure: COLONOSCOPY WITH PROPOFOL;  Surgeon: Irving Copas., MD;  Location: [place];  Service: Gastroenterology;  Laterality: N/A;   ESOPHAGOGASTRODUODENOSCOPY (EGD) WITH PROPOFOL N/A 04/14/2018   Procedure: ESOPHAGOGASTRODUODENOSCOPY (EGD) WITH PROPOFOL;  Surgeon: Yetta Flock, MD;  Location: [facility];  Service: Gastroenterology;  Laterality: N/A;   FEMORAL-POPLITEAL BYPASS GRAFT Right 04/10/2018   Procedure: REPAIR RIGHT FEMORAL ARTERY PSEUDOANEURYSM, RETROPERITONEAL EXPOSURE OF ILIAC ARTERY, RIGHT POPLITEAL EMBOLECTOMY;  Surgeon: Angelia Mould, MD;  Location: [facility];  Service: Vascular;  Laterality: Right;   FLEXIBLE SIGMOIDOSCOPY N/A 06/17/2019   Procedure: FLEXIBLE SIGMOIDOSCOPY;  Surgeon: Lavena Bullion, DO;  [facility];  Service: Gastroenterology;  Laterality: N/A;   HEMOSTASIS CLIP PLACEMENT  06/17/2019   Procedure: HEMOSTASIS CLIP PLACEMENT;  Surgeon: Lavena Bullion, DO;  [facility];  Service: Gastroenterology;;   LYMPH NODE DISSECTION Right 06/07/2013   Procedure: LYMPH NODE DISSECTION;  Surgeon: [name], MD;  Location: [facility];  Service: Thoracic;  Laterality: Right;   PATCH ANGIOPLASTY Right 04/10/2018   Procedure: PATCH  ANGIOPLASTY OF RIGHT FEMORAL ARTERY USING BOVINE PATCH, PATCH ANGIOPLASTY OF RIGHT POPLITEAL ARTERY USING BOVINE PATCH;  Surgeon: Angelia Mould, MD;  Location: [facility];  Service: Vascular;  Laterality: Right;   SCHLEROTHERAPY  06/17/2019   Procedure: [place] Ku OF VARICES;  Surgeon: Lavena Bullion, DO;  [facility];  Service: Gastroenterology;;   [place]   "large incision from chest to up to shoulder, the nerves were tied together, for raynaud's   THORACOTOMY  06/07/2013   Procedure: MINI/LIMITED THORACOTOMY; right middle lobe wedge resection;  Surgeon: [name], MD;  Location: [facility];  Service:  Thoracic;;   TONSILLECTOMY  child   TOTAL ABDOMINAL HYSTERECTOMY  1980's    W/ BSO   TOTAL HIP ARTHROPLASTY Right 04/28/2014   Procedure: RIGHT TOTAL HIP ARTHROPLASTY ANTERIOR APPROACH;  Surgeon: [name], MD;  Location: [facility];  Service: Orthopedics;  Laterality: Right;   TRANSTHORACIC ECHOCARDIOGRAM  12/11/2008   ef 82-50%, grade 1 diastolic dysfunction/  mild LAE/  mild AR and MR/  trivial TR   VIDEO ASSISTED THORACOSCOPY (VATS)/WEDGE RESECTION Right 06/07/2013   Procedure: VIDEO ASSISTED THORACOSCOPY (VATS)/right upper lobectomy, On Q;  Surgeon: [name], MD;  Location: [facility];  Service: Thoracic;  Laterality: Right;   VIDEO BRONCHOSCOPY N/A 06/07/2013   Procedure: VIDEO BRONCHOSCOPY;  Surgeon: [name], MD;  Location: [facility] [place];  Service: Thoracic;  Laterality: N/A;   VIDEO BRONCHOSCOPY WITH ENDOBRONCHIAL NAVIGATION N/A 05/04/2013   Procedure: VIDEO BRONCHOSCOPY WITH ENDOBRONCHIAL NAVIGATION;  Surgeon: [name], MD;  Location: [place];  Service: Thoracic;  Laterality: N/A;    REVIEW OF SYSTEMS:  A comprehensive review of systems was negative except for: Ears, nose, mouth, throat, and face: positive for sore mouth Gastrointestinal: positive for constipation   PHYSICAL EXAMINATION: General appearance: alert, cooperative, and no distress Head: Normocephalic, without obvious abnormality, atraumatic Neck: no adenopathy, no JVD, supple, symmetrical, trachea midline, and thyroid not enlarged, symmetric, no tenderness/mass/nodules Lymph nodes: Cervical, supraclavicular, and axillary nodes normal. Resp: clear to auscultation bilaterally Back: symmetric, no curvature. ROM normal. No CVA tenderness. Cardio: regular rate and rhythm, S1, S2 normal, no murmur, click, rub or gallop GI: soft, non-tender; bowel sounds normal; no masses,  no organomegaly Extremities: extremities normal, atraumatic, no cyanosis or edema  ECOG PERFORMANCE STATUS: 1 - Symptomatic but  completely ambulatory  Blood pressure (!) 151/85, pulse 94, temperature 98.2 F (36.8 C), temperature source Oral, resp. rate 16, height 5' (1.524 m), weight 111 lb 8 oz (50.6 kg), SpO2 97 %.  LABORATORY DATA: Lab Results  Component Value Date   WBC 6.2 12/19/2021   HGB 11.0 (L) 12/19/2021   HCT 33.0 (L) 12/19/2021   MCV 89.9 12/19/2021   PLT 152 12/19/2021      Chemistry      Component Value Date/Time   NA 127 (L) 12/03/2021 1444   NA 130 (L) 04/26/2021 0000   NA 130 (L) 08/21/2016 1125   K 5.1 12/03/2021 1444   K 4.8 08/21/2016 1125   CL 91 (L) 12/03/2021 1444  CO2 31 12/03/2021 1444   CO2 26 08/21/2016 1125   BUN 34 (H) 12/03/2021 1444   BUN 27 04/26/2021 0000   BUN 25.6 08/21/2016 1125   CREATININE 1.41 (H) 12/03/2021 1444   CREATININE 0.98 (H) 05/21/2020 1341   CREATININE 1.2 (H) 08/21/2016 1125      Component Value Date/Time   CALCIUM 9.6 12/03/2021 1444   CALCIUM 10.0 08/21/2016 1125   ALKPHOS 41 12/03/2021 1444   ALKPHOS 54 08/21/2016 1125   AST 25 12/03/2021 1444   AST 27 08/21/2016 1125   ALT 12 12/03/2021 1444   ALT 13 08/21/2016 1125   BILITOT 0.3 12/03/2021 1444   BILITOT 0.31 08/21/2016 1125       RADIOGRAPHIC STUDIES: No results found.  ASSESSMENT AND PLAN:  This is a very pleasant 88 years old white female with multifocal bilateral adenocarcinoma initially diagnosed as stage IIA non-small cell lung cancer, adenocarcinoma with positive EGFR mutation in exon 21 status post right upper lobectomy as well as wedge resection of the right middle lobe in March 2015 and the patient declined adjuvant systemic chemotherapy. The patient had recent imaging studies that showed increase in the size and number of multifocal disease. She is currently undergoing treatment with Tagrisso 80 mg p.o. daily started on November 19, 2021, status post 1 month of treatment. She has been tolerating the treatment well except for few blisters in the mouth and mild skin rash on  the scalp. I recommended for the patient to continue her current treatment with Tagrisso with the same dose. I will see her back for follow-up visit in 1 months for evaluation with repeat CT scan of the chest for restaging of her disease. For the hyponatremia and the anemia, we will continue to monitor her closely. The patient was advised to call immediately if she has any other concerning symptoms in the interval. The patient voices understanding of current disease status and treatment options and is in agreement with the current care plan. All questions were answered. The patient knows to call the clinic with any problems, questions or concerns. We can certainly see the patient much sooner if necessary. The total time spent in the appointment was 20 minutes.  Disclaimer: This note was dictated with voice recognition software. Similar sounding words can inadvertently be transcribed and may not be corrected upon review.

## 2021-12-20 ENCOUNTER — Other Ambulatory Visit: Payer: Self-pay

## 2021-12-21 ENCOUNTER — Other Ambulatory Visit: Payer: Self-pay

## 2021-12-23 ENCOUNTER — Emergency Department (HOSPITAL_COMMUNITY): Payer: Medicare PPO

## 2021-12-23 ENCOUNTER — Telehealth: Payer: Self-pay | Admitting: Internal Medicine

## 2021-12-23 ENCOUNTER — Telehealth: Payer: Self-pay | Admitting: Medical Oncology

## 2021-12-23 ENCOUNTER — Other Ambulatory Visit: Payer: Self-pay

## 2021-12-23 ENCOUNTER — Other Ambulatory Visit: Payer: Self-pay | Admitting: Physician Assistant

## 2021-12-23 ENCOUNTER — Inpatient Hospital Stay (HOSPITAL_BASED_OUTPATIENT_CLINIC_OR_DEPARTMENT_OTHER): Payer: Medicare PPO | Admitting: Physician Assistant

## 2021-12-23 ENCOUNTER — Encounter (HOSPITAL_COMMUNITY): Payer: Self-pay | Admitting: Oncology

## 2021-12-23 ENCOUNTER — Ambulatory Visit (HOSPITAL_COMMUNITY)
Admission: RE | Admit: 2021-12-23 | Discharge: 2021-12-23 | Disposition: A | Discharge status: Home / Self Care | Payer: Medicare PPO | Source: Ambulatory Visit | Attending: Physician Assistant | Admitting: Physician Assistant

## 2021-12-23 ENCOUNTER — Inpatient Hospital Stay: Payer: Medicare PPO

## 2021-12-23 ENCOUNTER — Inpatient Hospital Stay (HOSPITAL_COMMUNITY)
Admission: EM | Admit: 2021-12-23 | Discharge: 2021-12-29 | DRG: 291 | Disposition: A | Discharge status: Home Health | Payer: Medicare PPO | Attending: Internal Medicine | Admitting: Internal Medicine

## 2021-12-23 VITALS — BP 135/79 | HR 86 | Temp 97.6°F | Resp 14 | Wt 114.9 lb

## 2021-12-23 DIAGNOSIS — T451X5A Adverse effect of antineoplastic and immunosuppressive drugs, initial encounter: Secondary | ICD-10-CM | POA: Diagnosis present

## 2021-12-23 DIAGNOSIS — K219 Gastro-esophageal reflux disease without esophagitis: Secondary | ICD-10-CM | POA: Diagnosis present

## 2021-12-23 DIAGNOSIS — J9811 Atelectasis: Secondary | ICD-10-CM | POA: Diagnosis not present

## 2021-12-23 DIAGNOSIS — C3411 Malignant neoplasm of upper lobe, right bronchus or lung: Secondary | ICD-10-CM

## 2021-12-23 DIAGNOSIS — R600 Localized edema: Secondary | ICD-10-CM

## 2021-12-23 DIAGNOSIS — C3490 Malignant neoplasm of unspecified part of unspecified bronchus or lung: Secondary | ICD-10-CM | POA: Diagnosis not present

## 2021-12-23 DIAGNOSIS — I7300 Raynaud's syndrome without gangrene: Secondary | ICD-10-CM | POA: Diagnosis present

## 2021-12-23 DIAGNOSIS — M0609 Rheumatoid arthritis without rheumatoid factor, multiple sites: Secondary | ICD-10-CM | POA: Diagnosis present

## 2021-12-23 DIAGNOSIS — C3410 Malignant neoplasm of upper lobe, unspecified bronchus or lung: Secondary | ICD-10-CM | POA: Diagnosis present

## 2021-12-23 DIAGNOSIS — M069 Rheumatoid arthritis, unspecified: Secondary | ICD-10-CM | POA: Diagnosis not present

## 2021-12-23 DIAGNOSIS — D638 Anemia in other chronic diseases classified elsewhere: Secondary | ICD-10-CM | POA: Diagnosis present

## 2021-12-23 DIAGNOSIS — R0902 Hypoxemia: Secondary | ICD-10-CM

## 2021-12-23 DIAGNOSIS — R197 Diarrhea, unspecified: Secondary | ICD-10-CM | POA: Diagnosis present

## 2021-12-23 DIAGNOSIS — E871 Hypo-osmolality and hyponatremia: Secondary | ICD-10-CM

## 2021-12-23 DIAGNOSIS — I509 Heart failure, unspecified: Secondary | ICD-10-CM | POA: Diagnosis not present

## 2021-12-23 DIAGNOSIS — I080 Rheumatic disorders of both mitral and aortic valves: Secondary | ICD-10-CM | POA: Diagnosis present

## 2021-12-23 DIAGNOSIS — I5020 Unspecified systolic (congestive) heart failure: Secondary | ICD-10-CM | POA: Diagnosis not present

## 2021-12-23 DIAGNOSIS — Z886 Allergy status to analgesic agent status: Secondary | ICD-10-CM

## 2021-12-23 DIAGNOSIS — I7381 Erythromelalgia: Secondary | ICD-10-CM | POA: Diagnosis present

## 2021-12-23 DIAGNOSIS — J90 Pleural effusion, not elsewhere classified: Secondary | ICD-10-CM | POA: Diagnosis not present

## 2021-12-23 DIAGNOSIS — N1831 Chronic kidney disease, stage 3a: Secondary | ICD-10-CM | POA: Diagnosis not present

## 2021-12-23 DIAGNOSIS — I130 Hypertensive heart and chronic kidney disease with heart failure and stage 1 through stage 4 chronic kidney disease, or unspecified chronic kidney disease: Principal | ICD-10-CM | POA: Diagnosis present

## 2021-12-23 DIAGNOSIS — J439 Emphysema, unspecified: Secondary | ICD-10-CM | POA: Diagnosis not present

## 2021-12-23 DIAGNOSIS — I420 Dilated cardiomyopathy: Secondary | ICD-10-CM | POA: Diagnosis present

## 2021-12-23 DIAGNOSIS — J948 Other specified pleural conditions: Secondary | ICD-10-CM | POA: Diagnosis not present

## 2021-12-23 DIAGNOSIS — J189 Pneumonia, unspecified organism: Secondary | ICD-10-CM | POA: Diagnosis present

## 2021-12-23 DIAGNOSIS — J9600 Acute respiratory failure, unspecified whether with hypoxia or hypercapnia: Secondary | ICD-10-CM | POA: Diagnosis present

## 2021-12-23 DIAGNOSIS — Z79890 Hormone replacement therapy (postmenopausal): Secondary | ICD-10-CM

## 2021-12-23 DIAGNOSIS — E039 Hypothyroidism, unspecified: Secondary | ICD-10-CM | POA: Diagnosis present

## 2021-12-23 DIAGNOSIS — R54 Age-related physical debility: Secondary | ICD-10-CM | POA: Diagnosis present

## 2021-12-23 DIAGNOSIS — Z79899 Other long term (current) drug therapy: Secondary | ICD-10-CM | POA: Diagnosis not present

## 2021-12-23 DIAGNOSIS — R0603 Acute respiratory distress: Secondary | ICD-10-CM

## 2021-12-23 DIAGNOSIS — I5043 Acute on chronic combined systolic (congestive) and diastolic (congestive) heart failure: Secondary | ICD-10-CM | POA: Insufficient documentation

## 2021-12-23 DIAGNOSIS — M3500 Sicca syndrome, unspecified: Secondary | ICD-10-CM | POA: Diagnosis present

## 2021-12-23 DIAGNOSIS — M810 Age-related osteoporosis without current pathological fracture: Secondary | ICD-10-CM | POA: Diagnosis present

## 2021-12-23 DIAGNOSIS — Z1152 Encounter for screening for COVID-19: Secondary | ICD-10-CM

## 2021-12-23 DIAGNOSIS — J918 Pleural effusion in other conditions classified elsewhere: Secondary | ICD-10-CM | POA: Diagnosis present

## 2021-12-23 DIAGNOSIS — Z79818 Long term (current) use of other agents affecting estrogen receptors and estrogen levels: Secondary | ICD-10-CM

## 2021-12-23 DIAGNOSIS — Z888 Allergy status to other drugs, medicaments and biological substances status: Secondary | ICD-10-CM | POA: Diagnosis not present

## 2021-12-23 DIAGNOSIS — L930 Discoid lupus erythematosus: Secondary | ICD-10-CM | POA: Diagnosis present

## 2021-12-23 DIAGNOSIS — Z902 Acquired absence of lung [part of]: Secondary | ICD-10-CM

## 2021-12-23 DIAGNOSIS — Z96641 Presence of right artificial hip joint: Secondary | ICD-10-CM | POA: Diagnosis present

## 2021-12-23 DIAGNOSIS — R059 Cough, unspecified: Secondary | ICD-10-CM | POA: Diagnosis not present

## 2021-12-23 DIAGNOSIS — R918 Other nonspecific abnormal finding of lung field: Secondary | ICD-10-CM | POA: Diagnosis not present

## 2021-12-23 DIAGNOSIS — Z8249 Family history of ischemic heart disease and other diseases of the circulatory system: Secondary | ICD-10-CM

## 2021-12-23 DIAGNOSIS — K449 Diaphragmatic hernia without obstruction or gangrene: Secondary | ICD-10-CM | POA: Diagnosis present

## 2021-12-23 DIAGNOSIS — I10 Essential (primary) hypertension: Secondary | ICD-10-CM | POA: Diagnosis present

## 2021-12-23 DIAGNOSIS — I110 Hypertensive heart disease with heart failure: Secondary | ICD-10-CM | POA: Diagnosis not present

## 2021-12-23 DIAGNOSIS — I340 Nonrheumatic mitral (valve) insufficiency: Secondary | ICD-10-CM | POA: Diagnosis not present

## 2021-12-23 DIAGNOSIS — J9601 Acute respiratory failure with hypoxia: Principal | ICD-10-CM | POA: Diagnosis present

## 2021-12-23 DIAGNOSIS — I447 Left bundle-branch block, unspecified: Secondary | ICD-10-CM | POA: Diagnosis present

## 2021-12-23 LAB — CBC WITH DIFFERENTIAL (CANCER CENTER ONLY)
Abs Immature Granulocytes: 0.05 10*3/uL (ref 0.00–0.07)
Basophils Absolute: 0 10*3/uL (ref 0.0–0.1)
Basophils Relative: 1 %
Eosinophils Absolute: 0.1 10*3/uL (ref 0.0–0.5)
Eosinophils Relative: 1 %
HCT: 32.6 % — ABNORMAL LOW (ref 36.0–46.0)
Hemoglobin: 10.8 g/dL — ABNORMAL LOW (ref 12.0–15.0)
Immature Granulocytes: 1 %
Lymphocytes Relative: 10 %
Lymphs Abs: 0.6 10*3/uL — ABNORMAL LOW (ref 0.7–4.0)
MCH: 29.4 pg (ref 26.0–34.0)
MCHC: 33.1 g/dL (ref 30.0–36.0)
MCV: 88.8 fL (ref 80.0–100.0)
Monocytes Absolute: 0.6 10*3/uL (ref 0.1–1.0)
Monocytes Relative: 9 %
Neutro Abs: 4.9 10*3/uL (ref 1.7–7.7)
Neutrophils Relative %: 78 %
Platelet Count: 169 10*3/uL (ref 150–400)
RBC: 3.67 MIL/uL — ABNORMAL LOW (ref 3.87–5.11)
RDW: 13 % (ref 11.5–15.5)
WBC Count: 6.3 10*3/uL (ref 4.0–10.5)
nRBC: 0 % (ref 0.0–0.2)

## 2021-12-23 LAB — RESP PANEL BY RT-PCR (FLU A&B, COVID) ARPGX2
Influenza A by PCR: NEGATIVE
Influenza B by PCR: NEGATIVE
SARS Coronavirus 2 by RT PCR: NEGATIVE

## 2021-12-23 LAB — CMP (CANCER CENTER ONLY)
ALT: 12 U/L (ref 0–44)
AST: 27 U/L (ref 15–41)
Albumin: 4.1 g/dL (ref 3.5–5.0)
Alkaline Phosphatase: 34 U/L — ABNORMAL LOW (ref 38–126)
Anion gap: 6 (ref 5–15)
BUN: 19 mg/dL (ref 8–23)
CO2: 28 mmol/L (ref 22–32)
Calcium: 9.3 mg/dL (ref 8.9–10.3)
Chloride: 87 mmol/L — ABNORMAL LOW (ref 98–111)
Creatinine: 1.03 mg/dL — ABNORMAL HIGH (ref 0.44–1.00)
GFR, Estimated: 52 mL/min — ABNORMAL LOW (ref >60)
Glucose, Bld: 124 mg/dL — ABNORMAL HIGH (ref 70–99)
Potassium: 4.8 mmol/L (ref 3.5–5.1)
Sodium: 121 mmol/L — ABNORMAL LOW (ref 135–145)
Total Bilirubin: 0.4 mg/dL (ref 0.3–1.2)
Total Protein: 7.1 g/dL (ref 6.5–8.1)

## 2021-12-23 LAB — SODIUM, URINE, RANDOM: Sodium, Ur: 95 mmol/L

## 2021-12-23 LAB — LACTIC ACID, PLASMA: Lactic Acid, Venous: 1.2 mmol/L (ref 0.5–1.9)

## 2021-12-23 LAB — PROCALCITONIN: Procalcitonin: <0.1 ng/mL

## 2021-12-23 LAB — BRAIN NATRIURETIC PEPTIDE: B Natriuretic Peptide: 874.2 pg/mL — ABNORMAL HIGH (ref 0.0–100.0)

## 2021-12-23 MED ORDER — LEVOTHYROXINE SODIUM 50 MCG PO TABS
75.0000 ug | ORAL_TABLET | Freq: Every day | ORAL | Status: DC
  Administered 2021-12-24 – 2021-12-29 (×6): 75 ug via ORAL
  Filled 2021-12-23 (×6): qty 1

## 2021-12-23 MED ORDER — BIOTIN 1000 MCG PO TABS: 1000.0000 ug | ORAL_TABLET | Freq: Every day | ORAL | Status: DC

## 2021-12-23 MED ORDER — TRAMADOL HCL 50 MG PO TABS
50.0000 mg | ORAL_TABLET | Freq: Three times a day (TID) | ORAL | Status: DC
  Administered 2021-12-24 – 2021-12-25 (×4): 50 mg via ORAL
  Filled 2021-12-23 (×7): qty 1

## 2021-12-23 MED ORDER — IOHEXOL 350 MG/ML SOLN
75.0000 mL | Freq: Once | INTRAVENOUS | Status: AC | PRN
  Administered 2021-12-23: 75 mL via INTRAVENOUS

## 2021-12-23 MED ORDER — ENOXAPARIN SODIUM 30 MG/0.3ML IJ SOSY
30.0000 mg | PREFILLED_SYRINGE | INTRAMUSCULAR | Status: DC
  Administered 2021-12-24 – 2021-12-28 (×6): 30 mg via SUBCUTANEOUS
  Filled 2021-12-23 (×6): qty 0.3

## 2021-12-23 MED ORDER — KETOROLAC TROMETHAMINE 15 MG/ML IJ SOLN
15.0000 mg | Freq: Once | INTRAMUSCULAR | Status: AC
  Administered 2021-12-24: 15 mg via INTRAVENOUS
  Filled 2021-12-23: qty 1

## 2021-12-23 MED ORDER — CARVEDILOL 3.125 MG PO TABS
3.1250 mg | ORAL_TABLET | Freq: Two times a day (BID) | ORAL | Status: DC
  Administered 2021-12-24 – 2021-12-29 (×13): 3.125 mg via ORAL
  Filled 2021-12-23 (×13): qty 1

## 2021-12-23 MED ORDER — OYSTER SHELL CALCIUM/D3 500-5 MG-MCG PO TABS
1.0000 | ORAL_TABLET | Freq: Every day | ORAL | Status: DC
  Administered 2021-12-24 – 2021-12-29 (×6): 1 via ORAL
  Filled 2021-12-23 (×6): qty 1

## 2021-12-23 MED ORDER — GABAPENTIN 300 MG PO CAPS
600.0000 mg | ORAL_CAPSULE | Freq: Every day | ORAL | Status: DC
  Administered 2021-12-24 – 2021-12-28 (×6): 600 mg via ORAL
  Filled 2021-12-23 (×6): qty 2

## 2021-12-23 MED ORDER — SODIUM CHLORIDE 0.9 % IV SOLN
500.0000 mg | Freq: Once | INTRAVENOUS | Status: AC
  Administered 2021-12-23: 500 mg via INTRAVENOUS
  Filled 2021-12-23: qty 5

## 2021-12-23 MED ORDER — SODIUM CHLORIDE 0.9 % IV SOLN
1.0000 g | Freq: Once | INTRAVENOUS | Status: AC
  Administered 2021-12-23: 1 g via INTRAVENOUS
  Filled 2021-12-23: qty 10

## 2021-12-23 MED ORDER — METHOCARBAMOL 500 MG PO TABS: 500.0000 mg | ORAL_TABLET | Freq: Four times a day (QID) | ORAL | Status: DC | PRN

## 2021-12-23 MED ORDER — PANTOPRAZOLE SODIUM 40 MG PO TBEC
40.0000 mg | DELAYED_RELEASE_TABLET | Freq: Every day | ORAL | Status: DC
  Administered 2021-12-24 – 2021-12-29 (×6): 40 mg via ORAL
  Filled 2021-12-23 (×6): qty 1

## 2021-12-23 MED ORDER — DOCUSATE SODIUM 100 MG PO CAPS
100.0000 mg | ORAL_CAPSULE | Freq: Two times a day (BID) | ORAL | Status: DC
  Administered 2021-12-24 – 2021-12-26 (×5): 100 mg via ORAL
  Filled 2021-12-23 (×8): qty 1

## 2021-12-23 MED ORDER — GABAPENTIN 300 MG PO CAPS
300.0000 mg | ORAL_CAPSULE | Freq: Two times a day (BID) | ORAL | Status: DC
  Administered 2021-12-24 – 2021-12-29 (×12): 300 mg via ORAL
  Filled 2021-12-23 (×12): qty 1

## 2021-12-23 MED ORDER — ONDANSETRON HCL 4 MG/2ML IJ SOLN
4.0000 mg | Freq: Once | INTRAMUSCULAR | Status: AC
  Administered 2021-12-23: 4 mg via INTRAVENOUS
  Filled 2021-12-23: qty 2

## 2021-12-23 MED ORDER — FUROSEMIDE 10 MG/ML IJ SOLN
40.0000 mg | Freq: Every day | INTRAMUSCULAR | Status: DC
  Administered 2021-12-24 – 2021-12-28 (×5): 40 mg via INTRAVENOUS
  Filled 2021-12-23 (×5): qty 4

## 2021-12-23 MED ORDER — OSIMERTINIB MESYLATE 80 MG PO TABS: 80.0000 mg | ORAL_TABLET | Freq: Every day | ORAL | Status: DC

## 2021-12-23 MED ORDER — SODIUM CHLORIDE 0.9 % IV SOLN
1.0000 g | INTRAVENOUS | Status: DC
  Administered 2021-12-24 – 2021-12-28 (×5): 1 g via INTRAVENOUS
  Filled 2021-12-23 (×5): qty 10

## 2021-12-23 MED ORDER — SODIUM CHLORIDE 0.9 % IV SOLN: 500.0000 mg | INTRAVENOUS | Status: DC

## 2021-12-23 MED ORDER — FUROSEMIDE 10 MG/ML IJ SOLN
20.0000 mg | Freq: Once | INTRAMUSCULAR | Status: AC
  Administered 2021-12-23: 20 mg via INTRAVENOUS
  Filled 2021-12-23: qty 4

## 2021-12-23 NOTE — Telephone Encounter (Signed)
Over the weekend developed " deep cough , weakness pain in middle of upper chest  and wheezing".  Oxygen sat is "90 % today . Home covid test is "negative".  Per Dr. Julien Nordmann , ask [facility] to see pt.

## 2021-12-23 NOTE — Progress Notes (Signed)
Symptom Management Consult note [place]    Patient Care Team: Copland, Gay Filler, MD as PCP - General (Family Medicine) Stanford Breed, Denice Bors, MD as PCP - Cardiology (Cardiology) Rigoberto Noel, MD as Consulting Physician (Pulmonary Disease) [name], MD as Consulting Physician (Internal Medicine) Curt Bears, MD as Consulting Physician (Oncology) [name], MD (Inactive) as Consulting Physician (Cardiothoracic Surgery) Zonia Kief, MD as Consulting Physician (Rehabilitation) Bo Merino, MD as Consulting Physician (Rheumatology)    Name of the patient: [name]  [id number]  1930/12/06   Date of visit: 12/23/2021   Chief Complaint/Reason for visit: shortness of breath, cough, chest pain   Current Therapy: PO Tagrisso 80 mg daily     ASSESSMENT & PLAN: Patient is a 88 y.o. female  with oncologic history of  Multifocal non-small cell lung cancer, adenocarcinoma initially diagnosed as stage IIA (T2b, N0, M0) non-small cell lung cancer, adenocarcinoma  followed by Dr. Julien Nordmann.  I have viewed most recent oncology note and lab work.    #)  Multifocal non-small cell lung cancer, adenocarcinoma initially diagnosed as stage IIA (T2b, N0, M0) non-small cell lung cancer, adenocarcinoma  -Recent visit with oncologist 12/19/21 with plan to continue Tagrisso and restaging scan in 1 month - Next appointment with oncologist is 01/23/22  #Hyponatremia and Anemia -Plan per last visit was to continue to monitor these.  -CMP today shows worsening hyponatremia 126 --> 121 -HgB similar 11 --> 10.8 .   #) Hypoxia w/ left sided chest pain -CXR before clinic showing right basilar opacity conrcerning for pna vs atelectasis and pleural effusion. I viewed image and agree with radiologist impression. No rales heard on exam, no fever, no leukocytosis making pna less likely. -Acute onset. Hypoxic to 84 % RA with ambulation in clinic. Wheezing, in  mild distress. Placed on 2L [place]. Also reporting L sided chest pain intermittent since yesterday.with these symptoms and exam patient requires ED evaluation for further work up. Patient transported to ED by myself and RN. Report given to accepting RN.    #) Lower extremity edema -Acute on chronic. Not taking lasix currently, was prescribed it before for edema however has not needed in several months.  -1+ piting edema lower extremities  -Weight has increased over the last week as well 9 lbs    Heme/Onc History: Oncology History Overview Note  Patient followed due to history of RUL opacity noted in 2010.  Patient recently worked up for rheumatologic disease and had CXR-abnormal   Lung cancer, Right upper lobe  04/22/2013 Imaging   PET showed moderate metabolic activity associated with right upper lobe mass which is increased intensity and size gradually over time is concerning for an indolent neoplasm such as low-grad adenocarcinoma.   04/27/2013 Imaging   CT Super D showed slowly enlarging mixed solid and subsolid mass in the right upper lobe has a highly aggressive apperance strongly concerning for adenocarcinoma.  Several right upper lobe bronchi extend to and through this lesion. 2.5 ground glass LUL   05/04/2013 Pathology Results   RUL lung biopsy showed adenocarcinoma    05/04/2013 Surgery   Bronchoscopy with electromagnetic navigational bronchoscopy and transbronchial biopsy of right upper lobe lung lesion.    05/08/2013 Initial Diagnosis   Lung cancer, Right upper lobe   08/01/2013 Imaging   MR Brain with and without contrast impression no acute or metastatic intracrainial abnormality   11/14/2021 -  Chemotherapy   Patient is on Treatment Plan : LUNG  NSCLC Osimertinib q28d         Interval history-: DANESE Ellis is a 88 y.o. female with oncologic history as above presenting to [facility] today with chief complaint of cough , shortness of breath, chest pain.  Patient is accompanied  by her daughter today who provides additional history.  Patient states her cough and shortness of breath started x2 days ago.  Her cough is nonproductive.  Her shortness of breath is constant and worse with activity.  She has a pulse ox at home and her oxygen was 89% at rest on room air.  Her symptoms have been progressively worsening.  She has not taken any over-the-counter medications prior to arrival.  Denies any sick contacts.  Her chest pain started last night and is located on the left.  Pain is worse with inspiration.  Denies history of similar pain.  She also states that her weight is trending up.  She is up 9 pounds from last week.  She has a prescription for Lasix for pedal edema however has not needed to take it in several months.  She states it was difficult getting her shoes on to come to clinic today because her feet were swollen.  She notices swelling has been worsening over the last 3 days as well.    Chart review shows she has a history of chronic combined systolic/diastolic congestive heart failure as well as cardiomyopathy and left bundle branch block. Last saw cardiologist 12/04/21.    ROS  All other systems are reviewed and are negative for acute change except as noted in the HPI.    Allergies  Allergen Reactions   Amlodipine Rash   Prochlorperazine Edisylate Anaphylaxis    Compazine--- tongue swells and rash   Aspirin Other (See Comments)    nose bleeds. Cannot take NSAIDS    Cymbalta [Duloxetine Hcl] Diarrhea, Nausea And Vomiting and Other (See Comments)    Increased blood pressure   Pamelor [Nortriptyline Hcl] Diarrhea and Nausea Only    Increased Heart rate and BP     Past Medical History:  Diagnosis Date   Arthralgia of multiple joints    followed by dr Gerilyn Nestle   Arthritis    Cardiomyopathy ([place])    Chronic constipation    Chronic inflammatory arthritis    rhemotolgist-  dr a. Gerilyn Nestle ([facility])   Dry eyes    eye drops used    GERD  (gastroesophageal reflux disease)    H/O discoid lupus erythematosus    Hiatal hernia    History of colon polyps    Hypothyroidism    Iron deficiency anemia    LBBB (left bundle branch block) 2010   Mitchell's disease (erythromelalgia) [facility])    neurologist-  dr patel   Nocturia    Non-small cell cancer of right lung [facility]) surgeon-- dr gerhardt/  oncologist-  dr Julien Nordmann--- per lov notes no recurrence/   11-18-2017 per pt denies any symptoms   dx 2015--  Stage IIA (T2b,N0,M0) , +EGFR  mutation in exon 21, non-small cell adenocarcinoma right upper lobe---  s/p  Right upper lobectomy , right middley wedge resection and node dissection---  no chemo or radiation therapy   OA (osteoarthritis)    hands   Osteoporosis    PONV (postoperative nausea and vomiting)    likes phenergan   Raynaud's phenomenon 1965   Renal insufficiency    Rheumatoid arthritis ([place])    Sciatica    Scoliosis    Sjogren's syndrome ([place])  Past Surgical History:  Procedure Laterality Date   ANTERIOR HIP REVISION Right 11/27/2017   Procedure: RIGHT HIP ACETABULAR REVISION;  Surgeon: [name], MD;  Location: [facility];  Service: Orthopedics;  Laterality: Right;   ANTERIOR HIP REVISION Right 01/24/2018   Procedure: OPEN REDUCTION OF DISLOCATED ANTERIOR HIP WITH REVISION OF LINER AND HIP BALL;  Surgeon: [name], MD;  [facility]: [facility];  Service: Orthopedics;  Laterality: Right;   APPENDECTOMY  1950s   BIOPSY  04/14/2018   Procedure: BIOPSY;  Surgeon: Yetta Flock, MD;  Location: [place];  Service: Gastroenterology;;   BIOPSY  04/16/2018   Procedure: BIOPSY;  Surgeon: Irving Copas., MD;  Location: [place];  Service: Gastroenterology;;   CARDIOVASCULAR STRESS TEST  12/2008    mild fixed basal to mid septal perfusion defect felt likely due to artifact from LBBB, no ischemia, EF 58%   COLONOSCOPY     COLONOSCOPY WITH PROPOFOL N/A 04/16/2018   Procedure: COLONOSCOPY  WITH PROPOFOL;  Surgeon: Irving Copas., MD;  Location: [place];  Service: Gastroenterology;  Laterality: N/A;   ESOPHAGOGASTRODUODENOSCOPY (EGD) WITH PROPOFOL N/A 04/14/2018   Procedure: ESOPHAGOGASTRODUODENOSCOPY (EGD) WITH PROPOFOL;  Surgeon: Yetta Flock, MD;  Location: [place];  Service: Gastroenterology;  Laterality: N/A;   FEMORAL-POPLITEAL BYPASS GRAFT Right 04/10/2018   Procedure: REPAIR RIGHT FEMORAL ARTERY PSEUDOANEURYSM, RETROPERITONEAL EXPOSURE OF ILIAC ARTERY, RIGHT POPLITEAL EMBOLECTOMY;  Surgeon: Angelia Mould, MD;  Location: [place];  Service: Vascular;  Laterality: Right;   FLEXIBLE SIGMOIDOSCOPY N/A 06/17/2019   Procedure: FLEXIBLE SIGMOIDOSCOPY;  Surgeon: Lavena Bullion, DO;  [facility];  Service: Gastroenterology;  Laterality: N/A;   HEMOSTASIS CLIP PLACEMENT  06/17/2019   Procedure: HEMOSTASIS CLIP PLACEMENT;  Surgeon: Lavena Bullion, DO;  [facility];  Service: Gastroenterology;;   LYMPH NODE DISSECTION Right 06/07/2013   Procedure: LYMPH NODE DISSECTION;  Surgeon: [name], MD;  Location: [place];  Service: Thoracic;  Laterality: Right;   PATCH ANGIOPLASTY Right 04/10/2018   Procedure: PATCH  ANGIOPLASTY OF RIGHT FEMORAL ARTERY USING BOVINE PATCH, PATCH ANGIOPLASTY OF RIGHT POPLITEAL ARTERY USING BOVINE PATCH;  Surgeon: Angelia Mould, MD;  Location: [place];  Service: Vascular;  Laterality: Right;   SCHLEROTHERAPY  06/17/2019   Procedure: [place] Ku OF VARICES;  Surgeon: Lavena Bullion, DO;  [facility];  Service: Gastroenterology;;   [place]   "large incision from chest to up to shoulder, the nerves were tied together, for raynaud's   THORACOTOMY  06/07/2013   Procedure: MINI/LIMITED THORACOTOMY; right middle lobe wedge resection;  Surgeon: [name], MD;  Location: [place];  Service: Thoracic;;   TONSILLECTOMY  child   TOTAL ABDOMINAL HYSTERECTOMY   1980's    W/ BSO   TOTAL HIP ARTHROPLASTY Right 04/28/2014   Procedure: RIGHT TOTAL HIP ARTHROPLASTY ANTERIOR APPROACH;  Surgeon: [name], MD;  [facility];  Service: Orthopedics;  Laterality: Right;   TRANSTHORACIC ECHOCARDIOGRAM  12/11/2008   ef 90-93%, grade 1 diastolic dysfunction/  mild LAE/  mild AR and MR/  trivial TR   VIDEO ASSISTED THORACOSCOPY (VATS)/WEDGE RESECTION Right 06/07/2013   Procedure: VIDEO ASSISTED THORACOSCOPY (VATS)/right upper lobectomy, On Q;  Surgeon: [name], MD;  Location: [place];  Service: Thoracic;  Laterality: Right;   VIDEO BRONCHOSCOPY N/A 06/07/2013   Procedure: VIDEO BRONCHOSCOPY;  Surgeon: [name], MD;  Location: [place];  Service: Thoracic;  Laterality: N/A;  VIDEO BRONCHOSCOPY WITH ENDOBRONCHIAL NAVIGATION N/A 05/04/2013   Procedure: VIDEO BRONCHOSCOPY WITH ENDOBRONCHIAL NAVIGATION;  Surgeon: [name], MD;  [facility];  Service: Thoracic;  Laterality: N/A;    Social History   Socioeconomic History   Marital status: Widowed    Spouse name: Not on file   Number of children: 2   Years of education: Not on file   Highest education level: Not on file  Occupational History   Occupation: n/a  Tobacco Use   Smoking status: Never    Passive exposure: Past   Smokeless tobacco: Never  Vaping Use   Vaping Use: Never used  Substance and Sexual Activity   Alcohol use: Not Currently   Drug use: Never   Sexual activity: Not Currently    Birth control/protection: Surgical  Other Topics Concern   Not on file  Social History Narrative   Lives with husband, daughter and grandchild local.   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   Social Determinants of Health   Financial Resource Strain: [place]  (05/07/2021)   Overall Financial Resource Strain (CARDIA)    Difficulty of Paying Living Expenses: Not hard at all  Food Insecurity: No Food Insecurity (05/07/2021)   Hunger Vital  Sign    Worried About Running Out of Food in the Last Year: Never true    [place] in the Last Year: Never true  Transportation Needs: No Transportation Needs (05/07/2021)   PRAPARE - Hydrologist (Medical): No    Lack of Transportation (Non-Medical): No  Physical Activity: Insufficiently Active (05/07/2021)   Exercise Vital Sign    Days of Exercise per Week: 3 days    Minutes of Exercise per Session: 30 min  Stress: No Stress Concern Present (05/07/2021)   [place]    Feeling of Stress : Not at all  Social Connections: Moderately Integrated (05/07/2021)   Social Connection and Isolation Panel [NHANES]    Frequency of Communication with Friends and Family: More than three times a week    Frequency of Social Gatherings with Friends and Family: More than three times a week    Attends Religious Services: More than 4 times per year    Active Member of [employer] or Organizations: Yes    Attends Archivist Meetings: 1 to 4 times per year    Marital Status: Widowed  Intimate Partner Violence: Not At Risk (05/07/2021)   Humiliation, Afraid, Rape, and Kick questionnaire    Fear of Current or Ex-Partner: No    Emotionally Abused: No    Physically Abused: No    Sexually Abused: No    Family History  Problem Relation Age of Onset   Coronary artery disease Father    Colon cancer Father    Diabetes Father    Cancer Father        colon   Other Mother 32       MVA   Healthy Sister    Healthy Brother    Healthy Daughter    Hypothyroidism Daughter    Other Brother        killed in war   Pneumonia Sister    Healthy Daughter    Esophageal cancer Neg Hx    Kidney disease Neg Hx    Liver disease Neg Hx     No current facility-administered medications for this visit.  Current Outpatient Medications:  Abatacept (ORENCIA IV), Inject into the vein every 28 (twenty-eight) days., Disp:  , Rfl:    Acetaminophen (TYLENOL EXTRA STRENGTH PO), Take 1-2 tablets by mouth every 6 (six) hours as needed (pain). , Disp: , Rfl:    Biotin 1000 MCG tablet, Take 1,000 mcg by mouth daily., Disp: , Rfl:    Calcium Carbonate-Vitamin D 500-125 MG-UNIT TABS, Take 1 tablet by mouth daily., Disp: , Rfl:    carvedilol (COREG) 3.125 MG tablet, Take 1 tablet (3.125 mg total) by mouth 2 (two) times daily with a meal., Disp: 180 tablet, Rfl: 3   cephALEXin (KEFLEX) 500 MG capsule, Take 1 capsule (500 mg total) by mouth 2 (two) times daily., Disp: 14 capsule, Rfl: 0   denosumab (PROLIA) 60 MG/ML SOSY injection, Inject 60 mg into the skin every 6 (six) months., Disp: , Rfl:    docusate sodium (COLACE) 100 MG capsule, Take 100 mg by mouth 2 (two) times daily., Disp: , Rfl:    estradiol (ESTRACE VAGINAL) 0.1 MG/GM vaginal cream, Place one gram vaginally up to three times a week as needed to maintain comfort, Disp: 42.5 g, Rfl: 12   furosemide (LASIX) 20 MG tablet, Take 1 tablet (20 mg total) by mouth every other day., Disp: 45 tablet, Rfl: 3   gabapentin (NEURONTIN) 300 MG capsule, TAKE 1 CAPSULE BY MOUTH IN THE MORNING, 1 IN THE AFTERNOON, AND 2 AT BEDTIME, Disp: 360 capsule, Rfl: 1   Glucosamine-Chondroit-Vit C-Mn (GLUCOSAMINE CHONDR 1500 COMPLX PO), Take 1 capsule by mouth daily., Disp: , Rfl:    HYDROcodone-acetaminophen (NORCO/VICODIN) 5-325 MG tablet, Take 0.5-1 tablets by mouth every 8 (eight) hours as needed., Disp: 30 tablet, Rfl: 0   levothyroxine (SYNTHROID) 75 MCG tablet, Take 1 tablet (75 mcg total) by mouth daily before breakfast., Disp: 90 tablet, Rfl: 1   losartan (COZAAR) 25 MG tablet, Take 25 mg by mouth daily., Disp: , Rfl:    methocarbamol (ROBAXIN) 500 MG tablet, TAKE 1 TABLET BY MOUTH EVERY 6 HOURS AS NEEDED FOR MUSCLE SPASMS., Disp: 40 tablet, Rfl: 0   Multiple Vitamin (MULTIVITAMIN) tablet, Take 1 tablet by mouth daily., Disp: , Rfl:    omeprazole (PRILOSEC) 20 MG capsule, TAKE 1 CAPSULE  BY MOUTH EVERY DAY, Disp: 90 capsule, Rfl: 3   osimertinib mesylate (TAGRISSO) 80 MG tablet, Take 1 tablet (80 mg total) by mouth daily., Disp: 30 tablet, Rfl: 3   Probiotic Product (PROBIOTIC PO), Take 1 capsule by mouth daily. , Disp: , Rfl:    VITAMIN D PO, Take by mouth daily., Disp: , Rfl:   Facility-Administered Medications Ordered in Other Visits:    azithromycin (ZITHROMAX) 500 mg in sodium chloride 0.9 % 250 mL IVPB, 500 mg, Intravenous, Once, Hayden Rasmussen, MD   cefTRIAXone (ROCEPHIN) 1 g in sodium chloride 0.9 % 100 mL IVPB, 1 g, Intravenous, Once, Hayden Rasmussen, MD, Last Rate: 200 mL/hr at 12/23/21 1700, 1 g at 12/23/21 1700  PHYSICAL EXAM: ECOG FS:1 - Symptomatic but completely ambulatory    Vitals:   12/23/21 1518  BP: 135/79  Pulse: 86  Resp: 14  Temp: 97.6 F (36.4 C)  TempSrc: Oral  SpO2: 93%  Weight: 114 lb 14.4 oz (52.1 kg)   Physical Exam Vitals and nursing note reviewed.  Constitutional:      Appearance: She is well-developed. She is not ill-appearing or toxic-appearing.     Comments: Thin appearing elderly female  HENT:     Head: Normocephalic.  Nose: Nose normal.  Eyes:     Conjunctiva/sclera: Conjunctivae normal.  Neck:     Vascular: No JVD.  Cardiovascular:     Rate and Rhythm: Normal rate and regular rhythm.     Pulses: Normal pulses.     Heart sounds: Normal heart sounds.     Comments: Bilateral 1+ pedal edema Pulmonary:     Effort: Respiratory distress present.     Comments: No rales or rhonchi appreciated. Expiratory wheezing heard. Talking in short sentences. Placed on 2L [place] Abdominal:     General: There is no distension.  Musculoskeletal:     Cervical back: Normal range of motion.  Skin:    General: Skin is warm and dry.  Neurological:     Mental Status: She is alert and oriented to person, place, and time.        LABORATORY DATA: I have reviewed the data as listed    Latest Ref Rng & Units 12/23/2021    2:58 PM  12/19/2021   10:41 AM 12/03/2021    2:44 PM  CBC  WBC 4.0 - 10.5 K/uL 6.3  6.2  5.1   Hemoglobin 12.0 - 15.0 g/dL 10.8  11.0  11.3   Hematocrit 36.0 - 46.0 % 32.6  33.0  34.3   Platelets 150 - 400 K/uL 169  152  140         Latest Ref Rng & Units 12/23/2021    2:58 PM 12/19/2021   10:41 AM 12/03/2021    2:44 PM  CMP  Glucose 70 - 99 mg/dL 124  87  128   BUN 8 - 23 mg/dL 19  26  34   Creatinine 0.44 - 1.00 mg/dL 1.03  1.10  1.41   Sodium 135 - 145 mmol/L 121  126  127   Potassium 3.5 - 5.1 mmol/L 4.8  5.1  5.1   Chloride 98 - 111 mmol/L 87  93  91   CO2 22 - 32 mmol/L _0 Calcium 8.9 - 10.3 mg/dL 9.3  9.2  9.6   Total Protein 6.5 - 8.1 g/dL 7.1  7.2  7.3   Total Bilirubin 0.3 - 1.2 mg/dL 0.4  0.3  0.3   Alkaline Phos 38 - 126 U/L 34  31  41   AST 15 - 41 U/L _1 ALT 0 - 44 U/L _2 RADIOGRAPHIC STUDIES (from last 24 hours if applicable) I have personally reviewed the radiological images as listed and agreed with the findings in the report. DG Chest 2 View  Result Date: 12/23/2021 CLINICAL DATA:  Cough, lung cancer. EXAM: CHEST - 2 VIEW COMPARISON:  July 29, 2014.  September 30, 2021. FINDINGS: Stable cardiomediastinal silhouette. Right basilar opacity is noted concerning for pneumonia or atelectasis and associated pleural effusion. Underlying malignancy cannot be excluded. Mild left basilar atelectasis or scarring is noted. Bony thorax is unremarkable. IMPRESSION: Right basilar opacity is noted concerning for pneumonia or atelectasis and associated pleural effusion. Underlying malignancy cannot be excluded. Electronically Signed   By: Marijo Conception M.D.   On: 12/23/2021 14:42        Visit Diagnosis: 1. Anemia of chronic disease   2. Malignant neoplasm of upper lobe of right lung ([place])   3. Hyponatremia   4. Hypoxia      No orders of the defined types were placed in  this encounter.   All questions were answered.No barriers to learning  was detected.  I have spent a total of 30 minutes minutes of face-to-face and non-face-to-face time, preparing to see the patient, obtaining and/or reviewing separately obtained history, performing a medically appropriate examination, counseling and educating the patient, ordering tests, documenting clinical information in the electronic health record, and care coordination (communications with other health care professionals or caregivers).    Thank you for allowing me to participate in the care of this patient.    Barrie Folk, PA-C Department of Hematology/Oncology [facility] at [facility] Phone: [phone]  [phone]    12/23/2021 5:24 PM

## 2021-12-23 NOTE — Telephone Encounter (Signed)
Called patient per in basket, notified of upcoming appointments.

## 2021-12-23 NOTE — Assessment & Plan Note (Signed)
multifocal non-small cell lung cancer -follows with Dr. Julien Nordmann  -continue Tagrisso although concerned about cardiotoxicity with current CHF exacerbation

## 2021-12-23 NOTE — Progress Notes (Signed)
Chest x ray order

## 2021-12-23 NOTE — Progress Notes (Signed)
PHARMACIST - PHYSICIAN ORDER COMMUNICATION  CONCERNING: P&T Medication Policy on Herbal Medications  DESCRIPTION:  This patient's order for:  Biotin  has been noted.  This product(s) is classified as an "herbal" or natural product. Due to a lack of definitive safety studies or FDA approval, nonstandard manufacturing practices, plus the potential risk of unknown drug-drug interactions while on inpatient medications, the Pharmacy and Therapeutics Committee does not permit the use of "herbal" or natural products of this type within [place].   ACTION TAKEN: The pharmacy department is unable to verify this order at this time and your patient has been informed of this safety policy. Please reevaluate patient's clinical condition at discharge and address if the herbal or natural product(s) should be resumed at that time.  Nyaire Denbleyker, PharmD 

## 2021-12-23 NOTE — Assessment & Plan Note (Addendum)
Last echocardiogram on 04/2021 with EF of 40 to 45% with grade 1 diastolic dysfunction. -CTA chest showing moderate right pleural effusion, new small left pleural effusion. BNP of 874. -possible due to chemotherapy. Appreciate oncology input in the morning -IV Lasix 40mg  daily -strict intake and output -daily wts -repeat echocardiogram

## 2021-12-23 NOTE — Assessment & Plan Note (Signed)
Stable. On IV Lasix.

## 2021-12-23 NOTE — ED Triage Notes (Signed)
Pt brought to ED from [place] d/t shob x 1 week. Chest x-ray preformed today showed pneumonia. Pt has dyspnea at rest. Per [place] RN pt's O2 sats drop w/ ambulation into the 80's.

## 2021-12-23 NOTE — Assessment & Plan Note (Signed)
Stable Hgb at baseline of 10.8

## 2021-12-23 NOTE — Assessment & Plan Note (Signed)
Secondary to bilateral pleural effusion from acute on chronic combined CHF and possible pneumonia -no fever, leukocytosis to suggest pneumonia. Check Procalcitonin and will keep on IV antibiotics for now -continue IV diuretic. Tx as below for CHF exacerbation. -wean Bipap as tolerated

## 2021-12-23 NOTE — Assessment & Plan Note (Signed)
On IV Orencia followed by rheumatology

## 2021-12-23 NOTE — ED Provider Notes (Signed)
[facility] DEPT Provider Note   CSN: [id number] Arrival date & time: 12/23/21  1615     History {Add pertinent medical, surgical, social history, OB history to HPI:1} Chief Complaint  Patient presents with   Shortness of Breath    [name] is a 88 y.o. female.  Is brought over from the cancer center for evaluation of cough shortness of breath.  Daughter is helping with history.  [name] has a history of lung cancer and is active chemotherapy.  Started with some wheezing and shortness of breath nonproductive cough over the last few days.  No fevers.  [name] does have a little bit of left-sided sharp chest pain with deep breath.  Nausea no vomiting.  Is also up 9 pounds over the last week, [name] thinks [name] is retaining some fluids.  At the cancer center [name] was hypoxic and was placed on oxygen.  [name] had lab work done and an x-ray which showed a possible right-sided pneumonia.  The history is provided by the patient.  Shortness of Breath Severity:  Moderate Onset quality:  Gradual Duration:  3 days Timing:  Constant Progression:  Worsening Chronicity:  New Relieved by:  None tried Worsened by:  Activity and coughing Ineffective treatments:  None tried Associated symptoms: chest pain, cough and wheezing   Associated symptoms: no abdominal pain, no fever, no hemoptysis, no sputum production and no vomiting   Risk factors: hx of cancer   Risk factors: no tobacco use        Home Medications Prior to Admission medications   Medication Sig Start Date End Date Taking? Authorizing Provider  Abatacept (ORENCIA IV) Inject into the vein every 28 (twenty-eight) days.    [provider]  Acetaminophen (TYLENOL EXTRA STRENGTH PO) Take 1-2 tablets by mouth every 6 (six) hours as needed (pain).     [provider]  Biotin 1000 MCG tablet Take 1,000 mcg by mouth daily.    [provider]  Calcium Carbonate-Vitamin D 500-125 MG-UNIT TABS  Take 1 tablet by mouth daily.    [provider]  carvedilol (COREG) 3.125 MG tablet Take 1 tablet (3.125 mg total) by mouth 2 (two) times daily with a meal. 12/04/21   Crenshaw, Denice Bors, MD  cephALEXin (KEFLEX) 500 MG capsule Take 1 capsule (500 mg total) by mouth 2 (two) times daily. 11/28/21   Copland, Gay Filler, MD  denosumab (PROLIA) 60 MG/ML SOSY injection Inject 60 mg into the skin every 6 (six) months.    [provider]  docusate sodium (COLACE) 100 MG capsule Take 100 mg by mouth 2 (two) times daily.    [provider]  estradiol (ESTRACE VAGINAL) 0.1 MG/GM vaginal cream Place one gram vaginally up to three times a week as needed to maintain comfort 10/23/21   Copland, Gay Filler, MD  furosemide (LASIX) 20 MG tablet Take 1 tablet (20 mg total) by mouth every other day. 09/11/21   Lelon Perla, MD  gabapentin (NEURONTIN) 300 MG capsule TAKE 1 CAPSULE BY MOUTH IN THE MORNING, 1 IN THE AFTERNOON, AND 2 AT BEDTIME 07/08/21   Copland, Gay Filler, MD  Glucosamine-Chondroit-Vit C-Mn (GLUCOSAMINE CHONDR 1500 COMPLX PO) Take 1 capsule by mouth daily.    [provider]  HYDROcodone-acetaminophen (NORCO/VICODIN) 5-325 MG tablet Take 0.5-1 tablets by mouth every 8 (eight) hours as needed. 08/26/21   Copland, Gay Filler, MD  levothyroxine (SYNTHROID) 75 MCG tablet Take 1 tablet (75 mcg total) by mouth  daily before breakfast. 11/06/21   Copland, Gay Filler, MD  losartan (COZAAR) 25 MG tablet Take 25 mg by mouth daily. 11/01/21   [provider]  methocarbamol (ROBAXIN) 500 MG tablet TAKE 1 TABLET BY MOUTH EVERY 6 HOURS AS NEEDED FOR MUSCLE SPASMS. 11/08/21   [name], MD  Multiple Vitamin (MULTIVITAMIN) tablet Take 1 tablet by mouth daily.    [provider]  omeprazole (PRILOSEC) 20 MG capsule TAKE 1 CAPSULE BY MOUTH EVERY DAY 09/24/21   Copland, Gay Filler, MD  osimertinib mesylate (TAGRISSO) 80 MG tablet Take 1 tablet (80 mg total) by mouth  daily. 11/11/21   Curt Bears, MD  Probiotic Product (PROBIOTIC PO) Take 1 capsule by mouth daily.     [provider]  VITAMIN D PO Take by mouth daily.    [provider]      Allergies    Amlodipine, Prochlorperazine edisylate, Aspirin, Cymbalta [duloxetine hcl], and Pamelor [nortriptyline hcl]    Review of Systems   Review of Systems  Constitutional:  Negative for fever.  Eyes:  Negative for visual disturbance.  Respiratory:  Positive for cough, shortness of breath and wheezing. Negative for hemoptysis and sputum production.   Cardiovascular:  Positive for chest pain.  Gastrointestinal:  Positive for nausea. Negative for abdominal pain and vomiting.    Physical Exam Updated Vital Signs BP 131/86 (BP Location: Right Arm)   Pulse 94   Temp 98.7 F (37.1 C) (Oral)   Resp (!) 24   Ht 5' (1.524 m)   Wt 51.7 kg   SpO2 92%   BMI 22.26 kg/m  Physical Exam Vitals and nursing note reviewed.  Constitutional:      General: [name] is not in acute distress.    Appearance: [name] is well-developed.  HENT:     Head: Normocephalic and atraumatic.  Eyes:     Conjunctiva/sclera: Conjunctivae normal.  Cardiovascular:     Rate and Rhythm: Normal rate and regular rhythm.     Heart sounds: No murmur heard. Pulmonary:     Effort: Pulmonary effort is normal. No respiratory distress.     Breath sounds: Normal breath sounds.  Abdominal:     Palpations: Abdomen is soft.     Tenderness: There is no abdominal tenderness.  Musculoskeletal:        General: No swelling. Normal range of motion.     Cervical back: Neck supple.     Right lower leg: No tenderness. Edema present.     Left lower leg: No tenderness. Edema present.     Comments: [name] has maybe trace edema on her ankles and feet.  Skin:    General: Skin is warm and dry.     Capillary Refill: Capillary refill takes less than 2 seconds.  Neurological:     Mental Status: [name] is alert.  Psychiatric:        Mood and  Affect: Mood normal.     ED Results / Procedures / Treatments   Labs (all labs ordered are listed, but only abnormal results are displayed) Labs Reviewed  CULTURE, BLOOD (ROUTINE X 2)  CULTURE, BLOOD (ROUTINE X 2)  RESP PANEL BY RT-PCR (FLU A&B, COVID) ARPGX2  LACTIC ACID, PLASMA  LACTIC ACID, PLASMA  BRAIN NATRIURETIC PEPTIDE    EKG None  Radiology DG Chest 2 View  Result Date: 12/23/2021 CLINICAL DATA:  Cough, lung cancer. EXAM: CHEST - 2 VIEW COMPARISON:  July 29, 2014.  September 30, 2021. FINDINGS: Stable cardiomediastinal silhouette.  Right basilar opacity is noted concerning for pneumonia or atelectasis and associated pleural effusion. Underlying malignancy cannot be excluded. Mild left basilar atelectasis or scarring is noted. Bony thorax is unremarkable. IMPRESSION: Right basilar opacity is noted concerning for pneumonia or atelectasis and associated pleural effusion. Underlying malignancy cannot be excluded. Electronically Signed   By: Marijo Conception M.D.   On: 12/23/2021 14:42    Procedures Procedures  {Document cardiac monitor, telemetry assessment procedure when appropriate:1}  Medications Ordered in ED Medications  cefTRIAXone (ROCEPHIN) 1 g in sodium chloride 0.9 % 100 mL IVPB (has no administration in time range)  azithromycin (ZITHROMAX) 500 mg in sodium chloride 0.9 % 250 mL IVPB (has no administration in time range)    ED Course/ Medical Decision Making/ A&P                           Medical Decision Making Amount and/or Complexity of Data Reviewed Labs: ordered.   MCKENZIE BOVE was evaluated in Emergency Department on 12/23/2021 for the symptoms described in the history of present illness. [name] was evaluated in the context of the global COVID-19 pandemic, which necessitated consideration that the patient might be at risk for infection with the SARS-CoV-2 virus that causes COVID-19. Institutional protocols and algorithms that pertain to the evaluation of  patients at risk for COVID-19 are in a state of rapid change based on information released by regulatory bodies including the CDC and federal and state organizations. These policies and algorithms were followed during the patient's care in the ED. This patient complains of ***; this involves an extensive number of treatment Options and is a complaint that carries with it a high risk of complications and morbidity. The differential includes ***  I ordered, reviewed and interpreted labs, which included *** I ordered medication *** and reviewed PMP when indicated. I ordered imaging studies which included *** and I independently    visualized and interpreted imaging which showed *** Additional history obtained from *** Previous records obtained and reviewed *** I consulted *** and discussed lab and imaging findings and discussed disposition.  Cardiac monitoring reviewed, *** Social determinants considered, *** Critical Interventions: ***  After the interventions stated above, I reevaluated the patient and found *** Admission and further testing considered, ***   {Document critical care time when appropriate:1} {Document review of labs and clinical decision tools ie heart score, Chads2Vasc2 etc:1}  {Document your independent review of radiology images, and any outside records:1} {Document your discussion with family members, caretakers, and with consultants:1} {Document social determinants of health affecting pt's care:1} {Document your decision making why or why not admission, treatments were needed:1} Final Clinical Impression(s) / ED Diagnoses Final diagnoses:  None    Rx / DC Orders ED Discharge Orders     None

## 2021-12-23 NOTE — Assessment & Plan Note (Signed)
127 (10/17)- 126 (11/2)- 121 on presentation -pt is fluid overload but also has diarrhea frequently due to chemo. Check serum osm, urine osm and urine sodium -monitor while on IV diuretic

## 2021-12-23 NOTE — H&P (Signed)
History and Physical    Patient: [name] ALP:[id number] DOB: 06-30-1930 DOA: 12/23/2021 DOS: the patient was seen and examined on 12/23/2021 PCP: Darreld Mclean, MD  Patient coming from: Home  Chief Complaint:  Chief Complaint  Patient presents with   Shortness of Breath   HPI: [name] is a 88 y.o. female with medical history significant of multifocal non-small cell lung cancer, dilated cardiomyopathy, combined systolic and diastolic HF, HTN, seronegative RA,osteoporosis,sicca syndrome, raynaud'sdisease, anemia of chronic disease who presents with hypoxia and shortness of breath.   She has noticed increase shortness of breath with mild cough for the past 3 days. No fever. Trace edema and has not taken Lasix in the past 3 days. Had nausea but no vomiting. Occasional diarrhea due to her chemotherapy. Has good appetite. Pt is independent and lives by herself with daughter nearby.  Pt had follow up with oncology today and was noted to be hypoxic to 84% on RA with wheezing and mild respiratory distress. CXR showing right basilar opacity concerning for pneumonia vs atelectasis and pleural effusion. Also noted to have acute on chronic LE edema with weight. She was advised to present to ED.    In the ED, she was afebrile, normotensive and ultimately required Bipap.   WBC of 6.3, hemoglobin of 10.8.  Lactate within normal limits at 1.2.  Sodium of 121, K of 4.8, chloride of 87, creatinine 1.03  BNP of 874  Negative flu/COVID PCR  CTA chest shows no pulmonary embolism but there is new moderate right pleural effusion, new small left pleural effusion.  Interstitial edema and scattered groundglass density opacities.  She was given IV Rocephin, azithromycin and 20 mg of IV Lasix in the ED.  Hospitalist then consulted for admission. Review of Systems: As mentioned in the history of present illness. All other systems reviewed and are negative. Past Medical History:  Diagnosis  Date   Arthralgia of multiple joints    followed by dr Gerilyn Nestle   Arthritis    Cardiomyopathy ([place])    Chronic constipation    Chronic inflammatory arthritis    rhemotolgist-  dr a. Gerilyn Nestle ([facility])   Dry eyes    eye drops used    GERD (gastroesophageal reflux disease)    H/O discoid lupus erythematosus    Hiatal hernia    History of colon polyps    Hypothyroidism    Iron deficiency anemia    LBBB (left bundle branch block) 2010   Mitchell's disease (erythromelalgia) [facility])    neurologist-  dr patel   Nocturia    Non-small cell cancer of right lung [facility]) surgeon-- dr gerhardt/  oncologist-  dr Julien Nordmann--- per lov notes no recurrence/   11-18-2017 per pt denies any symptoms   dx 2015--  Stage IIA (T2b,N0,M0) , +EGFR  mutation in exon 21, non-small cell adenocarcinoma right upper lobe---  s/p  Right upper lobectomy , right middley wedge resection and node dissection---  no chemo or radiation therapy   OA (osteoarthritis)    hands   Osteoporosis    PONV (postoperative nausea and vomiting)    likes phenergan   Raynaud's phenomenon 1965   Renal insufficiency    Rheumatoid arthritis ([place])    Sciatica    Scoliosis    Sjogren's syndrome [facility])    Past Surgical History:  Procedure Laterality Date   ANTERIOR HIP REVISION Right 11/27/2017   Procedure: RIGHT HIP ACETABULAR REVISION;  Surgeon: [name], MD;  Location: [facility];  Service: Orthopedics;  Laterality: Right;   ANTERIOR HIP REVISION Right 01/24/2018   Procedure: OPEN REDUCTION OF DISLOCATED ANTERIOR HIP WITH REVISION OF LINER AND HIP BALL;  Surgeon: [name], MD;  [facility];  Service: Orthopedics;  Laterality: Right;   APPENDECTOMY  1950s   BIOPSY  04/14/2018   Procedure: BIOPSY;  Surgeon: Yetta Flock, MD;  Location: [place];  Service: Gastroenterology;;   BIOPSY  04/16/2018   Procedure: BIOPSY;  Surgeon: Irving Copas., MD;  Location: [place];  Service:  Gastroenterology;;   CARDIOVASCULAR STRESS TEST  12/2008    mild fixed basal to mid septal perfusion defect felt likely due to artifact from LBBB, no ischemia, EF 58%   COLONOSCOPY     COLONOSCOPY WITH PROPOFOL N/A 04/16/2018   Procedure: COLONOSCOPY WITH PROPOFOL;  Surgeon: Irving Copas., MD;  Location: [place];  Service: Gastroenterology;  Laterality: N/A;   ESOPHAGOGASTRODUODENOSCOPY (EGD) WITH PROPOFOL N/A 04/14/2018   Procedure: ESOPHAGOGASTRODUODENOSCOPY (EGD) WITH PROPOFOL;  Surgeon: Yetta Flock, MD;  Location: [place];  Service: Gastroenterology;  Laterality: N/A;   FEMORAL-POPLITEAL BYPASS GRAFT Right 04/10/2018   Procedure: REPAIR RIGHT FEMORAL ARTERY PSEUDOANEURYSM, RETROPERITONEAL EXPOSURE OF ILIAC ARTERY, RIGHT POPLITEAL EMBOLECTOMY;  Surgeon: Angelia Mould, MD;  Location: [place];  Service: Vascular;  Laterality: Right;   FLEXIBLE SIGMOIDOSCOPY N/A 06/17/2019   Procedure: FLEXIBLE SIGMOIDOSCOPY;  Surgeon: Lavena Bullion, DO;  [facility];  Service: Gastroenterology;  Laterality: N/A;   HEMOSTASIS CLIP PLACEMENT  06/17/2019   Procedure: HEMOSTASIS CLIP PLACEMENT;  Surgeon: Lavena Bullion, DO;  [facility];  Service: Gastroenterology;;   LYMPH NODE DISSECTION Right 06/07/2013   Procedure: LYMPH NODE DISSECTION;  Surgeon: [name], MD;  Location: [place];  Service: Thoracic;  Laterality: Right;   PATCH ANGIOPLASTY Right 04/10/2018   Procedure: PATCH  ANGIOPLASTY OF RIGHT FEMORAL ARTERY USING BOVINE PATCH, PATCH ANGIOPLASTY OF RIGHT POPLITEAL ARTERY USING BOVINE PATCH;  Surgeon: Angelia Mould, MD;  Location: [place];  Service: Vascular;  Laterality: Right;   SCHLEROTHERAPY  06/17/2019   Procedure: [place] Ku OF VARICES;  Surgeon: Lavena Bullion, DO;  [facility];  Service: Gastroenterology;;   [place]   "large incision from chest to up to shoulder, the nerves were tied  together, for raynaud's   THORACOTOMY  06/07/2013   Procedure: MINI/LIMITED THORACOTOMY; right middle lobe wedge resection;  Surgeon: [name], MD;  Location: [place];  Service: Thoracic;;   TONSILLECTOMY  child   TOTAL ABDOMINAL HYSTERECTOMY  1980's    W/ BSO   TOTAL HIP ARTHROPLASTY Right 04/28/2014   Procedure: RIGHT TOTAL HIP ARTHROPLASTY ANTERIOR APPROACH;  Surgeon: [name], MD;  [facility];  Service: Orthopedics;  Laterality: Right;   TRANSTHORACIC ECHOCARDIOGRAM  12/11/2008   ef 22-97%, grade 1 diastolic dysfunction/  mild LAE/  mild AR and MR/  trivial TR   VIDEO ASSISTED THORACOSCOPY (VATS)/WEDGE RESECTION Right 06/07/2013   Procedure: VIDEO ASSISTED THORACOSCOPY (VATS)/right upper lobectomy, On Q;  Surgeon: [name], MD;  Location: [place];  Service: Thoracic;  Laterality: Right;   VIDEO BRONCHOSCOPY N/A 06/07/2013   Procedure: VIDEO BRONCHOSCOPY;  Surgeon: [name], MD;  Location: [place];  Service: Thoracic;  Laterality: N/A;   VIDEO BRONCHOSCOPY WITH ENDOBRONCHIAL NAVIGATION N/A 05/04/2013   Procedure: VIDEO BRONCHOSCOPY WITH ENDOBRONCHIAL NAVIGATION;  Surgeon: [name], MD;  Location: [place];  Service: Thoracic;  Laterality:  N/A;   Social History:  reports that she has never smoked. She has been exposed to tobacco smoke. She has never used smokeless tobacco. She reports that she does not currently use alcohol. She reports that she does not use drugs.  Allergies  Allergen Reactions   Amlodipine Rash   Prochlorperazine Edisylate Anaphylaxis    Compazine--- tongue swells and rash   Aspirin Other (See Comments)    nose bleeds. Cannot take NSAIDS    Cymbalta [Duloxetine Hcl] Diarrhea, Nausea And Vomiting and Other (See Comments)    Increased blood pressure   Pamelor [Nortriptyline Hcl] Diarrhea and Nausea Only    Increased Heart rate and BP    Family History  Problem Relation Age of Onset   Coronary artery disease Father     Colon cancer Father    Diabetes Father    Cancer Father        colon   Other Mother 77       MVA   Healthy Sister    Healthy Brother    Healthy Daughter    Hypothyroidism Daughter    Other Brother        killed in war   Pneumonia Sister    Healthy Daughter    Esophageal cancer Neg Hx    Kidney disease Neg Hx    Liver disease Neg Hx     Prior to Admission medications   Medication Sig Start Date End Date Taking? Authorizing Provider  Abatacept (ORENCIA IV) Inject into the vein every 28 (twenty-eight) days.   Yes [provider]  Acetaminophen (TYLENOL EXTRA STRENGTH PO) Take 1-2 tablets by mouth every 6 (six) hours as needed (pain).    Yes [provider]  Biotin 1000 MCG tablet Take 1,000 mcg by mouth daily.   Yes [provider]  Calcium Carbonate-Vitamin D 500-125 MG-UNIT TABS Take 1 tablet by mouth daily.   Yes [provider]  carvedilol (COREG) 3.125 MG tablet Take 1 tablet (3.125 mg total) by mouth 2 (two) times daily with a meal. 12/04/21  Yes Crenshaw, Denice Bors, MD  denosumab (PROLIA) 60 MG/ML SOSY injection Inject 60 mg into the skin every 6 (six) months.   Yes [provider]  docusate sodium (COLACE) 100 MG capsule Take 100 mg by mouth 2 (two) times daily.   Yes [provider]  furosemide (LASIX) 20 MG tablet Take 1 tablet (20 mg total) by mouth every other day. Patient taking differently: Take 20 mg by mouth as needed for fluid or edema. 09/11/21  Yes Lelon Perla, MD  gabapentin (NEURONTIN) 300 MG capsule TAKE 1 CAPSULE BY MOUTH IN THE MORNING, 1 IN THE AFTERNOON, AND 2 AT BEDTIME Patient taking differently: See admin instructions. Take one capsule (345m) by mouth in the morning, one capsule (3077m by mouth in the afternoon, and two capsules (600 mg) by mouth at bedtime 07/08/21  Yes Copland, JeGay FillerMD  Glucosamine-Chondroit-Vit C-Mn (GLUCOSAMINE CHONDR 1500 COMPLX PO) Take 1 capsule by mouth daily.   Yes  [provider]  HYDROcodone-acetaminophen (NORCO/VICODIN) 5-325 MG tablet Take 0.5-1 tablets by mouth every 8 (eight) hours as needed. 08/26/21  Yes Copland, JeGay FillerMD  levothyroxine (SYNTHROID) 75 MCG tablet Take 1 tablet (75 mcg total) by mouth daily before breakfast. 11/06/21  Yes Copland, JeGay FillerMD  methocarbamol (ROBAXIN) 500 MG tablet TAKE 1 TABLET BY MOUTH EVERY 6 HOURS AS NEEDED FOR MUSCLE SPASMS. Patient taking differently: Take 500 mg by mouth every  6 (six) hours as needed for muscle spasms. 11/08/21  Yes [name], MD  Multiple Vitamin (MULTIVITAMIN) tablet Take 1 tablet by mouth daily.   Yes [provider]  omeprazole (PRILOSEC) 20 MG capsule TAKE 1 CAPSULE BY MOUTH EVERY DAY Patient taking differently: Take 20 mg by mouth daily. 09/24/21  Yes Copland, Gay Filler, MD  osimertinib mesylate (TAGRISSO) 80 MG tablet Take 1 tablet (80 mg total) by mouth daily. 11/11/21  Yes Curt Bears, MD  Probiotic Product (PROBIOTIC PO) Take 1 capsule by mouth daily.    Yes [provider]  traMADol (ULTRAM) 50 MG tablet Take by mouth See admin instructions. Take 0.5-1 tablet by mouth every 8 hours as needed. Do not combine with other pain medication   Yes [provider]  VITAMIN D PO Take by mouth daily.   Yes [provider]  cephALEXin (KEFLEX) 500 MG capsule Take 1 capsule (500 mg total) by mouth 2 (two) times daily. Patient not taking: Reported on 12/23/2021 11/28/21   Copland, Gay Filler, MD  estradiol (ESTRACE VAGINAL) 0.1 MG/GM vaginal cream Place one gram vaginally up to three times a week as needed to maintain comfort Patient not taking: Reported on 12/23/2021 10/23/21   Copland, Gay Filler, MD  losartan (COZAAR) 25 MG tablet Take 25 mg by mouth daily. Patient not taking: Reported on 12/23/2021 11/01/21   [provider]    Physical Exam: Vitals:   12/23/21 1657 12/23/21 1730 12/23/21 1800 12/23/21 2045  BP: 130/81 138/89   132/72  Pulse: 90 91 99 86  Resp: 20 20 (!) 25   Temp:      TempSrc:      SpO2: 93% 95% 100% 97%  Weight:      Height:       Constitutional: NAD, calm, comfortable, nontoxic elderly female laying upright on BiPAP Eyes: lids and conjunctivae normal ENMT: Mucous membranes are moist.  Neck: normal, supple Respiratory: Diffuse rhonchorous lung sounds without any wheezing.  Normal respiratory effort. No accessory muscle use.  On BiPAP and able to speak in full sentences. Cardiovascular: Regular rate and rhythm, no murmurs / rubs / gallops.  Based bilateral distal lower extremity edema.   Abdomen: Soft, nontender.  Bowel sounds positive.  Musculoskeletal: no clubbing / cyanosis. No joint deformity upper and lower extremities. Good ROM, no contractures. Normal muscle tone.  Skin: Increasing erythema of the right pretibial region possibly due to increase edema. Neurologic: CN 2-12 grossly intact. Strength 5/5 in all 4.  Psychiatric: Normal judgment and insight. Alert and oriented x 3. Normal mood. Data Reviewed: {Tip this will not be part of the note when signed- Document your independent interpretation of telemetry tracing, EKG, lab, Radiology test or any other diagnostic tests. Add any new diagnostic test ordered today. (Optional):[id number]} See HPI  Assessment and Plan: * Acute respiratory failure with hypoxia (HCC) Secondary to bilateral pleural effusion from acute on chronic combined CHF and possible pneumonia -no fever, leukocytosis to suggest pneumonia. Check Procalcitonin and will keep on IV antibiotics for now -continue IV diuretic. Tx as below for CHF exacerbation. -wean Bipap as tolerated   Acute on chronic combined systolic and diastolic CHF (congestive heart failure) ([place]) Last echocardiogram on 04/2021 with EF of 40 to 45% with grade 1 diastolic dysfunction. -CTA chest showing moderate right pleural effusion, new small left pleural effusion. BNP of 874. -possible due to  chemotherapy. Appreciate oncology input in the morning -IV Lasix 53m daily -strict intake and output -  daily wts -repeat echocardiogram  HTN (hypertension) Stable. On IV Lasix.   Anemia of chronic disease Stable Hgb at baseline of 10.8   Hyponatremia 127 (10/17)- 126 (11/2)- 121 on presentation -pt is fluid overload but also has diarrhea frequently due to chemo. Check serum osm, urine osm and urine sodium -monitor while on IV diuretic  Rheumatoid arthritis involving multiple joints (HCC) On IV O[place]cia followed by rheumatology  Lung cancer, Right upper lobe multifocal non-small cell lung cancer -follows with Dr. Julien Nordmann  -continue Tagrisso although concerned about cardiotoxicity with current CHF exacerbation      Advance Care Planning:   Code Status: Full Code   Consults: none  Family Communication: Discussed with daughter at bedside  Severity of Illness: The appropriate patient status for this patient is INPATIENT. Inpatient status is judged to be reasonable and necessary in order to provide the required intensity of service to ensure the patient's safety. The patient's presenting symptoms, physical exam findings, and initial radiographic and laboratory data in the context of their chronic comorbidities is felt to place them at high risk for further clinical deterioration. Furthermore, it is not anticipated that the patient will be medically stable for discharge from the hospital within 2 midnights of admission.   * I certify that at the point of admission it is my clinical judgment that the patient will require inpatient hospital care spanning beyond 2 midnights from the point of admission due to high intensity of service, high risk for further deterioration and high frequency of surveillance required.*  Author: Orene Desanctis, DO 12/23/2021 9:29 PM  For on call review [email or web address].

## 2021-12-24 ENCOUNTER — Inpatient Hospital Stay (HOSPITAL_COMMUNITY): Payer: Medicare PPO

## 2021-12-24 DIAGNOSIS — I5043 Acute on chronic combined systolic (congestive) and diastolic (congestive) heart failure: Secondary | ICD-10-CM

## 2021-12-24 DIAGNOSIS — J9600 Acute respiratory failure, unspecified whether with hypoxia or hypercapnia: Secondary | ICD-10-CM | POA: Diagnosis present

## 2021-12-24 DIAGNOSIS — J9601 Acute respiratory failure with hypoxia: Secondary | ICD-10-CM

## 2021-12-24 LAB — ECHOCARDIOGRAM COMPLETE
Area-P 1/2: 4.3 cm2
Calc EF: 43.9 %
Height: 60 in
MV M vel: 3.94 m/s
MV Peak grad: 62.1 mmHg
MV VTI: 1.54 cm2
P 1/2 time: 459 msec
Radius: 0.8 cm
S' Lateral: 3.4 cm
Single Plane A2C EF: 49.5 %
Single Plane A4C EF: 35.4 %
Weight: 1824 oz

## 2021-12-24 LAB — RESPIRATORY PANEL BY PCR

## 2021-12-24 LAB — CBC
HCT: 27.6 % — ABNORMAL LOW (ref 36.0–46.0)
Hemoglobin: 9.1 g/dL — ABNORMAL LOW (ref 12.0–15.0)
MCH: 30 pg (ref 26.0–34.0)
MCHC: 33 g/dL (ref 30.0–36.0)
MCV: 91.1 fL (ref 80.0–100.0)
Platelets: 148 10*3/uL — ABNORMAL LOW (ref 150–400)
RBC: 3.03 MIL/uL — ABNORMAL LOW (ref 3.87–5.11)
RDW: 13 % (ref 11.5–15.5)
WBC: 6.7 10*3/uL (ref 4.0–10.5)
nRBC: 0 % (ref 0.0–0.2)

## 2021-12-24 LAB — BASIC METABOLIC PANEL
Anion gap: 5 (ref 5–15)
BUN: 20 mg/dL (ref 8–23)
CO2: 26 mmol/L (ref 22–32)
Calcium: 8.1 mg/dL — ABNORMAL LOW (ref 8.9–10.3)
Chloride: 92 mmol/L — ABNORMAL LOW (ref 98–111)
Creatinine, Ser: 1.15 mg/dL — ABNORMAL HIGH (ref 0.44–1.00)
GFR, Estimated: 46 mL/min — ABNORMAL LOW (ref >60)
Glucose, Bld: 90 mg/dL (ref 70–99)
Potassium: 4.7 mmol/L (ref 3.5–5.1)
Sodium: 123 mmol/L — ABNORMAL LOW (ref 135–145)

## 2021-12-24 LAB — OSMOLALITY: Osmolality: 268 mOsm/kg — ABNORMAL LOW (ref 275–295)

## 2021-12-24 LAB — OSMOLALITY, URINE: Osmolality, Ur: 382 mOsm/kg (ref 300–900)

## 2021-12-24 LAB — MRSA NEXT GEN BY PCR, NASAL: MRSA by PCR Next Gen: NOT DETECTED

## 2021-12-24 MED ORDER — IPRATROPIUM-ALBUTEROL 0.5-2.5 (3) MG/3ML IN SOLN
3.0000 mL | Freq: Four times a day (QID) | RESPIRATORY_TRACT | Status: DC
  Administered 2021-12-24: 3 mL via RESPIRATORY_TRACT
  Filled 2021-12-24: qty 3

## 2021-12-24 MED ORDER — ONDANSETRON 4 MG PO TBDP
4.0000 mg | ORAL_TABLET | Freq: Three times a day (TID) | ORAL | Status: DC | PRN
  Administered 2021-12-24 – 2021-12-25 (×2): 4 mg via ORAL
  Filled 2021-12-24 (×2): qty 1

## 2021-12-24 MED ORDER — ORAL CARE MOUTH RINSE: 15.0000 mL | OROMUCOSAL | Status: DC | PRN

## 2021-12-24 MED ORDER — AZITHROMYCIN 250 MG PO TABS
500.0000 mg | ORAL_TABLET | Freq: Every day | ORAL | Status: DC
  Administered 2021-12-24 – 2021-12-25 (×2): 500 mg via ORAL
  Filled 2021-12-24 (×2): qty 2

## 2021-12-24 MED ORDER — IPRATROPIUM-ALBUTEROL 0.5-2.5 (3) MG/3ML IN SOLN
3.0000 mL | Freq: Three times a day (TID) | RESPIRATORY_TRACT | Status: DC
  Administered 2021-12-25: 3 mL via RESPIRATORY_TRACT
  Filled 2021-12-24: qty 3

## 2021-12-24 MED ORDER — CHLORHEXIDINE GLUCONATE CLOTH 2 % EX PADS
6.0000 | MEDICATED_PAD | Freq: Every day | CUTANEOUS | Status: DC
  Administered 2021-12-24 – 2021-12-25 (×2): 6 via TOPICAL

## 2021-12-24 MED ORDER — SODIUM CHLORIDE 1 G PO TABS
1.0000 g | ORAL_TABLET | Freq: Every day | ORAL | Status: DC
  Administered 2021-12-24 – 2021-12-29 (×6): 1 g via ORAL
  Filled 2021-12-24 (×6): qty 1

## 2021-12-24 MED ORDER — OSIMERTINIB MESYLATE 80 MG PO TABS: 80.0000 mg | ORAL_TABLET | Freq: Every day | ORAL | Status: DC

## 2021-12-24 MED ORDER — SODIUM CHLORIDE 0.9 % IV SOLN: INTRAVENOUS | Status: DC | PRN

## 2021-12-24 NOTE — Progress Notes (Signed)
Prior-To-Admission Oral Chemotherapy for Treatment of Oncologic Disease   Order noted from Dr. Julien Nordmann to continue prior-to-admission oral chemotherapy regimen of Tagrisso.  Procedure Per Pharmacy & Therapeutics Committee Policy: Orders for continuation of home oral chemotherapy for treatment of an oncologic disease will be held unless approved by an oncologist during current admission.    For patients receiving oncology care at [facility], inpatient pharmacist contacts patient's oncologist during regular office hours to review. If earlier review is medically necessary, attending physician consults [facility] on-call oncologist   For patients receiving oncology care outside of [facility], attending physician consults patient's oncologist to review. If this oncologist or their coverage cannot be reached, attending physician consults [facility] on-call oncologist   Called Dr. Worthy Flank office this morning and confirmed that MD would like Tagrisso continued on hospital admission.   Lenis Noon, PharmD 12/24/2021, 10:31 AM

## 2021-12-24 NOTE — Hospital Course (Signed)
[name] is a 88 y.o. female with medical history significant of multifocal non-small cell lung cancer, dilated cardiomyopathy, combined systolic and diastolic HF, HTN, seronegative RA,osteoporosis,sicca syndrome, raynaud'sdisease, anemia of chronic disease who presents with hypoxia and shortness of breath.    She has noticed increase shortness of breath with mild cough for the past 3 days. No fever. Trace edema and has not taken Lasix in the past 3 days. Had nausea but no vomiting. Occasional diarrhea due to her chemotherapy. Has good appetite. Pt is independent and lives by herself with daughter nearby.   Pt had follow up with oncology today and was noted to be hypoxic to 84% on RA with wheezing and mild respiratory distress. CXR showing right basilar opacity concerning for pneumonia vs atelectasis and pleural effusion. Also noted to have acute on chronic LE edema with weight. She was advised to present to ED.    In the ED, she was afebrile, normotensive and ultimately required Bipap.  WBC of 6.3, hemoglobin of 10.8.  Lactate within normal limits at 1.2. Sodium of 121, K of 4.8, chloride of 87, creatinine 1.03 BNP of 874   Negative flu/COVID PCR   CTA chest shows no pulmonary embolism but there is new moderate right pleural effusion, new small left pleural effusion.  Interstitial edema and scattered groundglass density opacities.   She was given IV Rocephin, azithromycin and 20 mg of IV Lasix in the ED.  Hospitalist then consulted for admission.

## 2021-12-24 NOTE — Progress Notes (Signed)
RT gave pt flutter valve. Pt knows and understands how to use.

## 2021-12-24 NOTE — Progress Notes (Signed)
Clinical/Bedside Swallow Evaluation Patient Details  Name: [name] MRN: [id number] Date of Birth: 1930/03/17  Today's Date: 12/24/2021 Time: SLP Start Time (ACUTE ONLY): 74 SLP Stop Time (ACUTE ONLY): 1805 SLP Time Calculation (min) (ACUTE ONLY): 26 min  Past Medical History:  Past Medical History:  Diagnosis Date   Arthralgia of multiple joints    followed by dr Gerilyn Nestle   Arthritis    Cardiomyopathy ([place])    Chronic constipation    Chronic inflammatory arthritis    rhemotolgist-  dr a. Gerilyn Nestle ([facility])   Dry eyes    eye drops used    GERD (gastroesophageal reflux disease)    H/O discoid lupus erythematosus    Hiatal hernia    History of colon polyps    Hypothyroidism    Iron deficiency anemia    LBBB (left bundle branch block) 2010   Mitchell's disease (erythromelalgia) [facility])    neurologist-  dr patel   Nocturia    Non-small cell cancer of right lung [facility]) surgeon-- dr gerhardt/  oncologist-  dr Julien Nordmann--- per lov notes no recurrence/   11-18-2017 per pt denies any symptoms   dx 2015--  Stage IIA (T2b,N0,M0) , +EGFR  mutation in exon 21, non-small cell adenocarcinoma right upper lobe---  s/p  Right upper lobectomy , right middley wedge resection and node dissection---  no chemo or radiation therapy   OA (osteoarthritis)    hands   Osteoporosis    PONV (postoperative nausea and vomiting)    likes phenergan   Raynaud's phenomenon 1965   Renal insufficiency    Rheumatoid arthritis ([place])    Sciatica    Scoliosis    Sjogren's syndrome ([place])    Past Surgical History:  Past Surgical History:  Procedure Laterality Date   ANTERIOR HIP REVISION Right 11/27/2017   Procedure: RIGHT HIP ACETABULAR REVISION;  Surgeon: [name], MD;  Location: [facility];  Service: Orthopedics;  Laterality: Right;   ANTERIOR HIP REVISION Right 01/24/2018   Procedure: OPEN REDUCTION OF DISLOCATED ANTERIOR HIP WITH REVISION OF LINER AND HIP BALL;  Surgeon: [name], MD;  Location: [facility];  Service: Orthopedics;  Laterality: Right;   APPENDECTOMY  1950s   BIOPSY  04/14/2018   Procedure: BIOPSY;  Surgeon: Yetta Flock, MD;  Location: [place];  Service: Gastroenterology;;   BIOPSY  04/16/2018   Procedure: BIOPSY;  Surgeon: Irving Copas., MD;  Location: [place];  Service: Gastroenterology;;   CARDIOVASCULAR STRESS TEST  12/2008    mild fixed basal to mid septal perfusion defect felt likely due to artifact from LBBB, no ischemia, EF 58%   COLONOSCOPY     COLONOSCOPY WITH PROPOFOL N/A 04/16/2018   Procedure: COLONOSCOPY WITH PROPOFOL;  Surgeon: Irving Copas., MD;  Location: [place];  Service: Gastroenterology;  Laterality: N/A;   ESOPHAGOGASTRODUODENOSCOPY (EGD) WITH PROPOFOL N/A 04/14/2018   Procedure: ESOPHAGOGASTRODUODENOSCOPY (EGD) WITH PROPOFOL;  Surgeon: Yetta Flock, MD;  Location: [place];  Service: Gastroenterology;  Laterality: N/A;   FEMORAL-POPLITEAL BYPASS GRAFT Right 04/10/2018   Procedure: REPAIR RIGHT FEMORAL ARTERY PSEUDOANEURYSM, RETROPERITONEAL EXPOSURE OF ILIAC ARTERY, RIGHT POPLITEAL EMBOLECTOMY;  Surgeon: Angelia Mould, MD;  Location: [place];  Service: Vascular;  Laterality: Right;   FLEXIBLE SIGMOIDOSCOPY N/A 06/17/2019   Procedure: FLEXIBLE SIGMOIDOSCOPY;  Surgeon: Lavena Bullion, DO;  [facility];  Service: Gastroenterology;  Laterality: N/A;   HEMOSTASIS CLIP PLACEMENT  06/17/2019   Procedure: HEMOSTASIS CLIP PLACEMENT;  Surgeon: Bryan Lemma,  Dominic Pea, DO;  [facility];  Service: Gastroenterology;;   LYMPH NODE DISSECTION Right 06/07/2013   Procedure: LYMPH NODE DISSECTION;  Surgeon: [name], MD;  Location: [place];  Service: Thoracic;  Laterality: Right;   PATCH ANGIOPLASTY Right 04/10/2018   Procedure: PATCH  ANGIOPLASTY OF RIGHT FEMORAL ARTERY USING BOVINE PATCH, PATCH ANGIOPLASTY OF RIGHT POPLITEAL ARTERY USING BOVINE PATCH;   Surgeon: Angelia Mould, MD;  Location: [place];  Service: Vascular;  Laterality: Right;   SCHLEROTHERAPY  06/17/2019   Procedure: Woodward Ku OF VARICES;  Surgeon: Lavena Bullion, DO;  [facility];  Service: Gastroenterology;;   [place]   "large incision from chest to up to shoulder, the nerves were tied together, for raynaud's   THORACOTOMY  06/07/2013   Procedure: MINI/LIMITED THORACOTOMY; right middle lobe wedge resection;  Surgeon: [name], MD;  Location: [place];  Service: Thoracic;;   TONSILLECTOMY  child   TOTAL ABDOMINAL HYSTERECTOMY  1980's    W/ BSO   TOTAL HIP ARTHROPLASTY Right 04/28/2014   Procedure: RIGHT TOTAL HIP ARTHROPLASTY ANTERIOR APPROACH;  Surgeon: [name], MD;  [facility];  Service: Orthopedics;  Laterality: Right;   TRANSTHORACIC ECHOCARDIOGRAM  12/11/2008   ef 61-60%, grade 1 diastolic dysfunction/  mild LAE/  mild AR and MR/  trivial TR   VIDEO ASSISTED THORACOSCOPY (VATS)/WEDGE RESECTION Right 06/07/2013   Procedure: VIDEO ASSISTED THORACOSCOPY (VATS)/right upper lobectomy, On Q;  Surgeon: [name], MD;  Location: [place];  Service: Thoracic;  Laterality: Right;   VIDEO BRONCHOSCOPY N/A 06/07/2013   Procedure: VIDEO BRONCHOSCOPY;  Surgeon: [name], MD;  Location: [facility];  Service: Thoracic;  Laterality: N/A;   VIDEO BRONCHOSCOPY WITH ENDOBRONCHIAL NAVIGATION N/A 05/04/2013   Procedure: VIDEO BRONCHOSCOPY WITH ENDOBRONCHIAL NAVIGATION;  Surgeon: [name], MD;  Location: [place];  Service: Thoracic;  Laterality: N/A;   HPI:  88 y.o. female with medical history significant for Right upper lobe non-small cell lung cancer, status post right upper lobectomy and right middle lobe wedge resection, dilated cardiomyopathy, combined systolic and diastolic HF, HTN, seronegative RA,osteoporosis,sicca syndrome, raynaud'sdisease, anemia of chronic disease who presents with hypoxia and  shortness of breath. .CT chest 11/7 New moderate right pleural effusion, cannot exclude exudative/complex component. New small left pleural effusion. Interstitial edema and scattered ground-glass density opacities which along with motion artifact is thought to be obscuring some of the previous pulmonary nodules. Mild cardiomegaly. Moderate-sized hiatal hernia.      Swallow eval ordered.  Pt denies dysphagia - takes a PPI every morning before meals.  Daughter admits to pt having recent eructation but pt denies refluxing.    Assessment / Plan / Recommendation  Clinical Impression  Pt without focal CN deficit except left upper eye ptosis, which pt attributes to prior surgery in the 1960s to tx Raynaud's.  She easily passed 3 ounce [place] water challenge.  Articulation is adequate - without evidence of dysarthria.  No clinical indication of aspiration with all po observed and pt able to self feed.  Pt had recent visit with dentist requiring open mouth posture for approx one hour Mon and Tues of last week reported by daughter - after which pt reported sore throat.     Recommend advance diet as tolerated.  Instructed pt and her daughter to general aspiration precautions and importance of assuring respiratory status is adequate for po due to potential impairment of respiratory and swallow reciprocity.  All  education completed using teach back.   If pt is having episodes of aspiration, suspect may be from esophageal source given her hx of hiatal hernia, scolisos, Sjogren's, need for PPI and eructation.  Pt denies productive eructation.   Thanks for this consult.    Pt did need encouragement to sit upright for po intake - as she was at approx 35-45*.  Used reverse trendelenburg for positioning to decrease discomfort from her scoliosis. SLP Visit Diagnosis: Dysphagia, unspecified (R13.10)    Aspiration Risk  Mild aspiration risk    Diet Recommendation Regular;Thin liquid   Liquid Administration via: Cup (pt  prefers) Medication Administration: Whole meds with liquid Supervision: Patient able to self feed Compensations: Slow rate;Small sips/bites;Other (Comment) (start intake with liquids) Postural Changes: Remain upright for at least 30 minutes after po intake    Other  Recommendations Oral Care Recommendations: Oral care BID    Recommendations for follow up therapy are one component of a multi-disciplinary discharge planning process, led by the attending physician.  Recommendations may be updated based on patient status, additional functional criteria and insurance authorization.  Follow up Recommendations No SLP follow up none     Assistance Recommended at Discharge None  Functional Status Assessment Patient has not had a recent decline in their functional status  Frequency and Duration     N/a       Prognosis   N/a     Swallow Study   General HPI: 88 y.o. female with medical history significant for Right upper lobe non-small cell lung cancer, status post right upper lobectomy and right middle lobe wedge resection, dilated cardiomyopathy, combined systolic and diastolic HF, HTN, seronegative RA,osteoporosis,sicca syndrome, raynaud'sdisease, anemia of chronic disease who presents with hypoxia and shortness of breath. .CT chest 11/7 New moderate right pleural effusion, cannot exclude exudative/complex component. New small left pleural effusion. Interstitial edema and scattered ground-glass density opacities which along with motion artifact is thought to be obscuring some of the previous pulmonary nodules. Mild cardiomegaly. Moderate-sized hiatal hernia.      Swallow eval ordered.  Pt denies dysphagia - takes a PPI every morning before meals.  Daughter admits to pt having recent eructation but pt denies refluxing. Type of Study: Bedside Swallow Evaluation Previous Swallow Assessment: none in system Diet Prior to this Study: Thin liquids (clears) Temperature Spikes Noted: No Respiratory  Status: Nasal cannula History of Recent Intubation: No Behavior/Cognition: Alert;Cooperative Oral Cavity Assessment: Within Functional Limits Oral Care Completed by SLP: No Oral Cavity - Dentition: Dentures, bottom;Dentures, top (realigned) Vision: Functional for self-feeding Self-Feeding Abilities: Able to feed self Patient Positioning: Upright in bed Baseline Vocal Quality: Hoarse Volitional Cough: Strong Volitional Swallow: Able to elicit    Oral/Motor/Sensory Function Overall Oral Motor/Sensory Function: Within functional limits (left eye ptosis noted, pt reports due to prior surgery in the 1960s to tx raynauds causing cn deficits)   Ice Chips Ice chips: Not tested   Thin Liquid Thin Liquid: Within functional limits Presentation: Cup    Nectar Thick Nectar Thick Liquid: Not tested   Honey Thick Honey Thick Liquid: Not tested   Puree Puree: Within functional limits   Solid     Solid: Not tested      Macario Golds 12/24/2021,6:36 PM   Kathleen Lime, MS [facility] SLP [place] Office [phone] Pager [phone]

## 2021-12-24 NOTE — Progress Notes (Signed)
  Echocardiogram 2D Echocardiogram has been performed.  Bobbye Charleston 12/24/2021, 9:20 AM

## 2021-12-24 NOTE — Progress Notes (Signed)
RT changed PT from BiPAP to 5L [place]. At the request of MD, to give meds by mouth. PT maintained SpO2 at >93%.

## 2021-12-24 NOTE — Progress Notes (Addendum)
PROGRESS NOTE    Patient: [name]                            PCP: Darreld Mclean, MD                    DOB: 28-Oct-1930            DOA: 12/23/2021 WIO:[id number]             DOS: 12/24/2021, 1:21 PM   LOS: 1 day   Date of Service: The patient was seen and examined on 12/24/2021  Subjective:   The patient was seen and examined this morning. More stable this morning, has been weaned off BiPAP to high flow oxygen, Still complain of shortness of breath but stating that it has improved  Brief Narrative:   [name] is a 88 y.o. female with medical history significant of multifocal non-small cell lung cancer, dilated cardiomyopathy, combined systolic and diastolic HF, HTN, seronegative RA,osteoporosis,sicca syndrome, raynaud'sdisease, anemia of chronic disease who presents with hypoxia and shortness of breath.    She has noticed increase shortness of breath with mild cough for the past 3 days. No fever. Trace edema and has not taken Lasix in the past 3 days. Had nausea but no vomiting. Occasional diarrhea due to her chemotherapy. Has good appetite. Pt is independent and lives by herself with daughter nearby.   Pt had follow up with oncology today and was noted to be hypoxic to 84% on RA with wheezing and mild respiratory distress. CXR showing right basilar opacity concerning for pneumonia vs atelectasis and pleural effusion. Also noted to have acute on chronic LE edema with weight. She was advised to present to ED.    In the ED, she was afebrile, normotensive and ultimately required Bipap.  WBC of 6.3, hemoglobin of 10.8.  Lactate within normal limits at 1.2. Sodium of 121, K of 4.8, chloride of 87, creatinine 1.03 BNP of 874   Negative flu/COVID PCR   CTA chest shows no pulmonary embolism but there is new moderate right pleural effusion, new small left pleural effusion.  Interstitial edema and scattered groundglass density opacities.   She was given IV Rocephin,  azithromycin and 20 mg of IV Lasix in the ED.  Hospitalist then consulted for admission.    Assessment & Plan:   Principal Problem:   Acute respiratory failure with hypoxia (HCC) Active Problems:   Lung cancer, Right upper lobe   Rheumatoid arthritis involving multiple joints (HCC)   Hyponatremia   Anemia of chronic disease   HTN (hypertension)   Acute on chronic combined systolic and diastolic CHF (congestive heart failure) (HCC)   Acute respiratory failure (HCC)     Assessment and Plan: * Acute respiratory failure with hypoxia (HCC) Secondary to bilateral pleural effusion from acute on chronic combined CHF and possible pneumonia -Patient has been on BiPAP overnight, has been tapered down to high flow oxygen, continue to wean down to O2 by nasal cannula, 5 L, 3 L... Satting 98%  -no fever, leukocytosis to suggest pneumonia.  -Monitoring vitals, electrolytes, CBC and  Procalcitonin -With IV antibiotics for now -We will continue IV diuretic. Tx as below for CHF exacerbation. -BiPAP as needed -DuoNeb bronchodilators  Bilateral pleural effusion -As reviewed, ultrasound guided thoracentesis has been ordered -Pleural fluid evaluation, cytology, cultures will be followed    Acute on chronic combined systolic and diastolic CHF (congestive heart  failure) ([place]) Last echocardiogram on 04/2021 with EF of 40 to 45% with grade 1 diastolic dysfunction. -[facility]ete 2D echo reviewed: Left LV EJF 40 to 45%.mildly decreased function. The left ventricle demonstrates global  hypokinesis. Left ventricular diastolic  parameters are consistent with Grade II diastolic dysfunction Mitral regurgitation  -CTA chest showing moderate right pleural effusion, new small left pleural effusion. BNP of 874. -possible due to chemotherapy.  -Discussed with cardiology and primary oncologist Dr. Andrez Grime evaluate the patient accordingly  -IV Lasix 40mg  daily -strict intake and output -daily wts -Cardiology  team consulted, appreciate input  HTN (hypertension) Stable. On IV Lasix.   Anemia of chronic disease Stable Hgb at baseline of 10.8   Hyponatremia 127 (10/17)- 126 (11/2)- 121 on presentation -pt is fluid overload but also has diarrhea frequently due to chemo. Check serum osm, urine osm and urine sodium -monitor while on IV diuretic   Acute on chronic hyponatremia  -Serum sodium 121, 123 -Adding supplement sodium p.o. 1 g daily We will monitor closely likely exacerbated by lung CA     rheumatoid arthritis involving multiple joints ([place]) On IV Orencia followed by rheumatology--I am to hold for now  Lung cancer, Right upper lobe multifocal non-small cell lung cancer -follows with Dr. Julien Nordmann  -On Tagrisso although concerned about cardiotoxicity with current CHF exacerbation Discussed with Dr. Earlie Server okay to withhold for now    ----------------------------------------------------------------------------------------------------------------------------------------------- Nutritional status:  The patient's BMI is: Body mass index is 22.26 kg/m. I agree with the assessment and plan as outlined   ---------------------------------------------------------------------------------------------------------------------------------------------------- Cultures; Blood Cultures x 2 >> NGT  Sputum Culture >> NGT    ------------------------------------------------------------------------------------------------------------------------------------------------  DVT prophylaxis:  enoxaparin (LOVENOX) injection 30 mg Start: 12/23/21 2200   Code Status:   Code Status: Full Code  Family Communication: Daughter present at bedside updated The above findings and plan of care has been discussed with patient (and family)  in detail,  they expressed understanding and agreement of above. -Advance care planning has been discussed.   Admission status:   Status is: Inpatient Remains  inpatient appropriate because: Needing close observation and treatment due to acute respiratory failure, BiPAP, supplemental oxygen, IV     Procedures:   No admission procedures for hospital encounter.   Antimicrobials:  Anti-infectives (From admission, onward)    Start     Dose/Rate Route Frequency Ordered Stop   12/24/21 1800  azithromycin (ZITHROMAX) 500 mg in sodium chloride 0.9 % 250 mL IVPB  Status:  Discontinued        500 mg 250 mL/hr over 60 Minutes Intravenous Every 24 hours 12/23/21 2141 12/24/21 0709   12/24/21 1700  cefTRIAXone (ROCEPHIN) 1 g in sodium chloride 0.9 % 100 mL IVPB        1 g 200 mL/hr over 30 Minutes Intravenous Every 24 hours 12/23/21 2141     12/24/21 1000  azithromycin (ZITHROMAX) tablet 500 mg        500 mg Oral Daily 12/24/21 0709     12/23/21 1645  cefTRIAXone (ROCEPHIN) 1 g in sodium chloride 0.9 % 100 mL IVPB        1 g 200 mL/hr over 30 Minutes Intravenous  Once 12/23/21 1632 12/23/21 1731   12/23/21 1645  azithromycin (ZITHROMAX) 500 mg in sodium chloride 0.9 % 250 mL IVPB        500 mg 250 mL/hr over 60 Minutes Intravenous  Once 12/23/21 1632 12/23/21 2130        Medication:   azithromycin  500 mg Oral Daily   calcium-vitamin D  1 tablet Oral Daily   carvedilol  3.125 mg Oral BID WC   docusate sodium  100 mg Oral BID   enoxaparin (LOVENOX) injection  30 mg Subcutaneous Q24H   furosemide  40 mg Intravenous Daily   gabapentin  300 mg Oral BID   gabapentin  600 mg Oral QHS   levothyroxine  75 mcg Oral QAC breakfast   osimertinib mesylate  80 mg Oral Daily   pantoprazole  40 mg Oral Daily   sodium chloride  1 g Oral Daily   traMADol  50 mg Oral Q8H    methocarbamol   Objective:   Vitals:   12/24/21 0736 12/24/21 1100 12/24/21 1130 12/24/21 1150  BP: 116/70 116/62 107/66   Pulse: 79 85 84   Resp: 18 16 (!) 21   Temp: 97.8 F (36.6 C)   97.7 F (36.5 C)  TempSrc:    Oral  SpO2: 100% 100% 98%   Weight:      Height:         Intake/Output Summary (Last 24 hours) at 12/24/2021 1321 Last data filed at 12/23/2021 1731 Gross per 24 hour  Intake 100 ml  Output --  Net 100 ml   Filed Weights   12/23/21 1622  Weight: 51.7 kg     Examination:   Physical Exam  Constitution: Complaint of shortness of breath, --generalized cachexia Otherwise alert, cooperative, no distress,  Appears calm and comfortable  Psychiatric:   Normal and stable mood and affect, cognition intact,   HEENT:        Normocephalic, PERRL, otherwise with in Normal limits  Chest:         Chest symmetric Cardio vascular:  S1/S2, RRR, No murmure, No Rubs or Gallops  pulmonary: Diffuse rhonchi, minimal wheezing, bilateral lower lobe crackles  Respirations mildly labored Abdomen: Soft, non-tender, non-distended, bowel sounds,no masses, no organomegaly Muscular skeletal: Limited exam - in bed, able to move all 4 extremities,   Neuro: CNII-XII intact. , normal motor and sensation, reflexes intact  Extremities: No pitting edema lower extremities, +2 pulses  Skin: Dry, warm to touch, negative for any Rashes, No open wounds Wounds: per nursing documentation   ------------------------------------------------------------------------------------------------------------------------------------------    LABs:     Latest Ref Rng & Units 12/24/2021    4:58 AM 12/23/2021    2:58 PM 12/19/2021   10:41 AM  CBC  WBC 4.0 - 10.5 K/uL 6.7  6.3  6.2   Hemoglobin 12.0 - 15.0 g/dL 9.1  10.8  11.0   Hematocrit 36.0 - 46.0 % 27.6  32.6  33.0   Platelets 150 - 400 K/uL 148  169  152       Latest Ref Rng & Units 12/24/2021    4:58 AM 12/23/2021    2:58 PM 12/19/2021   10:41 AM  CMP  Glucose 70 - 99 mg/dL 90  124  87   BUN 8 - 23 mg/dL 20  19  26    Creatinine 0.44 - 1.00 mg/dL 1.15  1.03  1.10   Sodium 135 - 145 mmol/L 123  121  126   Potassium 3.5 - 5.1 mmol/L 4.7  4.8  5.1   Chloride 98 - 111 mmol/L 92  87  93   CO2 22 - 32 mmol/L 26  28  27    Calcium  8.9 - 10.3 mg/dL 8.1  9.3  9.2   Total Protein 6.5 - 8.1 g/dL  7.1  7.2   Total Bilirubin 0.3 - 1.2 mg/dL  0.4  0.3   Alkaline Phos 38 - 126 U/L  34  31   AST 15 - 41 U/L  27  29   ALT 0 - 44 U/L  12  13        Micro Results Recent Results (from the past 240 hour(s))  Culture, blood (routine x 2)     Status: None (Preliminary result)   Collection Time: 12/23/21  4:48 PM   Specimen: BLOOD  Result Value Ref Range Status   Specimen Description   Final    BLOOD BLOOD RIGHT FOREARM Performed at [facility] [address], [place], [place] [address]    Special Requests   Final    BOTTLES DRAWN AEROBIC AND ANAEROBIC Blood Culture results may not be optimal due to an excessive volume of blood received in culture bottles Performed at [facility] [address], [place], [place] [address]    Culture   Final    NO GROWTH < 12 HOURS Performed at [facility] [address] [place], [place] [address]    Report Status PENDING  Incomplete  Culture, blood (routine x 2)     Status: None (Preliminary result)   Collection Time: 12/23/21  4:48 PM   Specimen: BLOOD  Result Value Ref Range Status   Specimen Description   Final    BLOOD LEFT ANTECUBITAL Performed at [facility] [address] [place], [place] [address]    Special Requests   Final    BOTTLES DRAWN AEROBIC AND ANAEROBIC Blood Culture adequate volume Performed at [facility] [address], [place], [place] [address]    Culture   Final    NO GROWTH < 12 HOURS Performed at [facility] [address] [place], [place] [address]    Report Status PENDING  Incomplete  Resp Panel by RT-PCR (Flu A&B, Covid) Anterior Nasal Swab     Status: None   Collection Time: 12/23/21  5:16 PM   Specimen: Anterior Nasal Swab  Result Value Ref Range Status   SARS Coronavirus 2 by RT PCR NEGATIVE NEGATIVE Final    Comment: (NOTE) SARS-CoV-2  target nucleic acids are NOT DETECTED.  The SARS-CoV-2 RNA is generally detectable in upper respiratory specimens during the acute phase of infection. The lowest concentration of SARS-CoV-2 viral copies this assay can detect is 138 copies/mL. A negative result does not preclude SARS-Cov-2 infection and should not be used as the sole basis for treatment or other patient management decisions. A negative result may occur with  improper specimen collection/handling, submission of specimen other than nasopharyngeal swab, presence of viral mutation(s) within the areas targeted by this assay, and inadequate number of viral copies(<138 copies/mL). A negative result must be combined with clinical observations, patient history, and epidemiological information. The expected result is Negative.  Fact Sheet for Patients:  EntrepreneurPulse.com.au  Fact Sheet for Healthcare Providers:  IncredibleEmployment.be  This test is no t yet approved or cleared by the [place] FDA and  has been authorized for detection and/or diagnosis of SARS-CoV-2 by FDA under an Emergency Use Authorization (EUA). This EUA will remain  in effect (meaning this test can be used) for the duration of the COVID-19 declaration under Section 564(b)(1) of the Act, 21 U.S.C.section 360bbb-3(b)(1), unless the authorization is terminated  or revoked sooner.       Influenza A by PCR NEGATIVE  NEGATIVE Final   Influenza B by PCR NEGATIVE NEGATIVE Final    Comment: (NOTE) The Xpert Xpress SARS-CoV-2/FLU/RSV plus assay is intended as an aid in the diagnosis of influenza from Nasopharyngeal swab specimens and should not be used as a sole basis for treatment. Nasal washings and aspirates are unacceptable for Xpert Xpress SARS-CoV-2/FLU/RSV testing.  Fact Sheet for Patients: EntrepreneurPulse.com.au  Fact Sheet for Healthcare  Providers: IncredibleEmployment.be  This test is not yet approved or cleared by the [place] FDA and has been authorized for detection and/or diagnosis of SARS-CoV-2 by FDA under an Emergency Use Authorization (EUA). This EUA will remain in effect (meaning this test can be used) for the duration of the COVID-19 declaration under Section 564(b)(1) of the Act, 21 U.S.C. section 360bbb-3(b)(1), unless the authorization is terminated or revoked.  Performed at [facility], [place] [address], [place], [place] [address]     Radiology Reports ECHOCARDIOGRAM COMPLETE  Result Date: 12/24/2021    ECHOCARDIOGRAM REPORT   Patient Name:   [name] Date of Exam: 12/24/2021 Medical Rec #:  [id number]        Height:       60.0 in Accession #:    [id number]       Weight:       114.0 lb Date of Birth:  [birth year]        BSA:          1.470 m Patient Age:    88 years         BP:           116/70 mmHg Patient Gender: F                HR:           74 bpm. Exam Location:  Inpatient Procedure: 2D Echo, 3D Echo, Cardiac Doppler and Color Doppler Indications:    I50.40* Unspecified combined systolic (congestive) and diastolic                 (congestive) heart failure  History:        Patient has prior history of Echocardiogram examinations, most                 recent 04/17/2021. CHF, Signs/Symptoms:Shortness of Breath,                 Dyspnea and Edema; Risk Factors:Hypertension and Dyslipidemia.                 Lung cancer.  Sonographer:    Roseanna Rainbow RDCS Referring Phys: [id number] [place]  1. Left ventricular ejection fraction, by estimation, is 40 to 45%. Left ventricular ejection fraction by 3D volume is 45 %. The left ventricle has mildly decreased function. The left ventricle demonstrates global hypokinesis. Left ventricular diastolic  parameters are consistent with Grade II diastolic dysfunction (pseudonormalization). Elevated left atrial pressure.  2. Right  ventricular systolic function is normal. The right ventricular size is normal. There is normal pulmonary artery systolic pressure. The estimated right ventricular systolic pressure is 19.6 mmHg.  3. Left atrial size was moderately dilated.  4. Right atrial size was mildly dilated.  5. Large pleural effusion in the left lateral region.  6. 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 Severe mitral valve regurgitation due to central malcoaptation with flow reversal in the pulmonary veins and triangular "v-wave cutoff" spectral configuration.  7. The aortic valve is tricuspid. There is mild thickening of the  aortic valve. Aortic valve regurgitation is trivial. Aortic valve sclerosis is present, with no evidence of aortic valve stenosis.  8. The inferior vena cava is normal in size with <50% respiratory variability, suggesting right atrial pressure of 8 mmHg. Comparison(s): A prior study was performed on 04/17/2021. Prior images reviewed side by side. The left ventricular function is unchanged. There is marked worsening of the mitral insufficiency. The leaflets are restricted and there is central malcoaptation. LBBB is present but there does not appear to be severe LV septal-lateral dyssynchrony. FINDINGS  Left Ventricle: Left ventricular ejection fraction, by estimation, is 40 to 45%. Left ventricular ejection fraction by 3D volume is 45 %. The left ventricle has mildly decreased function. The left ventricle demonstrates global hypokinesis. The left ventricular internal cavity size was normal in size. There is no left ventricular hypertrophy. Abnormal (paradoxical) septal motion, consistent with left bundle branch block. Left ventricular diastolic parameters are consistent with Grade II diastolic dysfunction (pseudonormalization). Elevated left atrial pressure. Right Ventricle: The right ventricular size is normal. No increase in right ventricular wall thickness. Right ventricular systolic function is  normal. There is normal pulmonary artery systolic pressure. The tricuspid regurgitant velocity is 2.45 m/s, and  with an assumed right atrial pressure of 8 mmHg, the estimated right ventricular systolic pressure is 88.8 mmHg. Left Atrium: Left atrial size was moderately dilated. Right Atrium: Right atrial size was mildly dilated. Pericardium: There is no evidence of pericardial effusion. Mitral Valve: The mitral valve is rheumatic. There is mild thickening of the anterior and posterior mitral valve leaflet(s). Severe mitral valve regurgitation, with centrally-directed jet. MV peak gradient, 6.4 mmHg. The mean mitral valve gradient is 3.0  mmHg. Tricuspid Valve: The tricuspid valve is normal in structure. Tricuspid valve regurgitation is trivial. Aortic Valve: The aortic valve is tricuspid. There is mild thickening of the aortic valve. Aortic valve regurgitation is trivial. Aortic regurgitation PHT measures 459 msec. Aortic valve sclerosis is present, with no evidence of aortic valve stenosis. Pulmonic Valve: The pulmonic valve was grossly normal. Pulmonic valve regurgitation is not visualized. Aorta: The aortic root and ascending aorta are structurally normal, with no evidence of dilitation. Venous: A pattern of systolic flow reversal, suggestive of severe mitral regurgitation is recorded from the right upper pulmonary vein. The inferior vena cava is normal in size with less than 50% respiratory variability, suggesting right atrial pressure of 8 mmHg. IAS/Shunts: No atrial level shunt detected by color flow Doppler. Additional Comments: There is a large pleural effusion in the left lateral region.  LEFT VENTRICLE PLAX 2D LVIDd:         4.80 cm         Diastology LVIDs:         3.40 cm         LV e' medial:    5.87 cm/s LV PW:         0.80 cm         LV E/e' medial:  18.6 LV IVS:        0.80 cm         LV e' lateral:   10.40 cm/s LVOT diam:     2.10 cm         LV E/e' lateral: 10.5 LV SV:         61 LV SV Index:   41  LVOT Area:     3.46 cm        3D Volume EF  LV 3D EF:    Left                                             ventricul LV Volumes (MOD)                            ar LV vol d, MOD    128.0 ml                   ejection A2C:                                        fraction LV vol d, MOD    100.3 ml                   by 3D A4C:                                        volume is LV vol s, MOD    64.7 ml                    45 %. A2C: LV vol s, MOD    64.8 ml A4C:                           3D Volume EF: LV SV MOD A2C:   63.3 ml       3D EF:        45 % LV SV MOD A4C:   100.3 ml      LV EDV:       134 ml LV SV MOD BP:    52.6 ml       LV ESV:       74 ml                                LV SV:        60 ml RIGHT VENTRICLE             IVC RV S prime:     12.60 cm/s  IVC diam: 1.60 cm TAPSE (M-mode): 2.0 cm LEFT ATRIUM             Index        RIGHT ATRIUM           Index LA diam:        4.40 cm 2.99 cm/m   RA Area:     12.50 cm LA Vol (A2C):   43.7 ml 29.73 ml/m  RA Volume:   26.30 ml  17.89 ml/m LA Vol (A4C):   54.4 ml 37.01 ml/m LA Biplane Vol: 53.0 ml 36.06 ml/m  AORTIC VALVE LVOT Vmax:   88.60 cm/s LVOT Vmean:  59.600 cm/s LVOT VTI:    0.175 m AI PHT:      459 msec  AORTA Ao Root diam: 3.20 cm Ao Asc diam:  3.00 cm MITRAL VALVE                  TRICUSPID VALVE MV Area (  PHT): 4.30 cm       TR Peak grad:   24.0 mmHg MV Area VTI:   1.54 cm       TR Vmax:        245.00 cm/s MV Peak grad:  6.4 mmHg MV Mean grad:  3.0 mmHg       SHUNTS MV Vmax:       1.26 m/s       Systemic VTI:  0.18 m MV Vmean:      81.5 cm/s      Systemic Diam: 2.10 cm MV Decel Time: 176 msec MR Peak grad:    62.1 mmHg MR Mean grad:    30.5 mmHg MR Vmax:         394.00 cm/s MR Vmean:        242.5 cm/s MR PISA:         4.02 cm MR PISA Eff ROA: 39 mm MR PISA Radius:  0.80 cm MV E velocity: 109.00 cm/s MV A velocity: 65.47 cm/s MV E/A ratio:  1.66 Mihai Croitoru MD Electronically signed by Sanda Klein MD Signature  Date/Time: 12/24/2021/11:04:46 AM    Final    CT Angio Chest PE W/Cm &/Or Wo Cm  Result Date: 12/23/2021 CLINICAL DATA:  Right upper lobe non-small cell lung cancer, status post right upper lobectomy and right middle lobe wedge resection. Shortness of breath for 1 week.  Dyspnea at rest. * Tracking Code: BO * EXAM: CT ANGIOGRAPHY CHEST WITH CONTRAST TECHNIQUE: Multidetector CT imaging of the chest was performed using the standard protocol during bolus administration of intravenous contrast. Multiplanar CT image reconstructions and MIPs were obtained to evaluate the vascular anatomy. RADIATION DOSE REDUCTION: This exam was performed according to the departmental dose-optimization program which includes automated exposure control, adjustment of the mA and/or kV according to patient size and/or use of iterative reconstruction technique. CONTRAST:  65mL OMNIPAQUE IOHEXOL 350 MG/ML SOLN COMPARISON:  09/27/2021 FINDINGS: Cardiovascular: No filling defect is identified in the pulmonary arterial tree to suggest pulmonary embolus. Aortic atherosclerosis.  Mild cardiomegaly. Mediastinum/Nodes: Partial obscuration of mediastinal structures due to the very dense contrast medium in the SVC and brachiocephalic vein. No overt pathologic adenopathy identified. Moderate-sized hiatal hernia. Lungs/Pleura: Moderate right pleural effusion, cannot exclude exudative/complex component. Small left pleural effusion. Postoperative findings in the right lung. Confluent ground-glass density opacities scattered in the right lower lobe, obscuring the previous ground-glass density nodules to some degree. Substantial passive atelectasis in the right lung. Emphysema noted. Patchy ground-glass densities in the left upper lobe corresponding to secondary pulmonary lobules with associated septal thickening in the apex compatible with interstitial edema and mild airspace edema. Passive atelectasis in the left lower lobe. Reduce conspicuity of some of  the previous pulmonary nodules although some of this reduced conspicuity may be due to surrounding ground-glass opacities and also from motion artifact. I suspect that many of these pulmonary nodules are still present. Upper Abdomen: Unremarkable Musculoskeletal: Dextroconvex lumbar scoliosis. Severe degenerative glenohumeral arthropathy bilaterally. No compelling findings of osseous metastatic disease. Review of the MIP images confirms the above findings. IMPRESSION: 1. No filling defect is identified in the pulmonary arterial tree to suggest pulmonary embolus. 2. New moderate right pleural effusion, cannot exclude exudative/complex component. New small left pleural effusion. 3. Interstitial edema and scattered ground-glass density opacities which along with motion artifact is thought to be obscuring some of the previous pulmonary nodules. 4. Mild cardiomegaly. 5. Moderate-sized hiatal hernia. 6. Severe degenerative glenohumeral arthropathy bilaterally. 7. Aortic  atherosclerosis. Aortic Atherosclerosis (ICD10-I70.0). Electronically Signed   By: Van Clines M.D.   On: 12/23/2021 18:14   DG Chest 2 View  Result Date: 12/23/2021 CLINICAL DATA:  Cough, lung cancer. EXAM: CHEST - 2 VIEW COMPARISON:  July 29, 2014.  September 30, 2021. FINDINGS: Stable cardiomediastinal silhouette. Right basilar opacity is noted concerning for pneumonia or atelectasis and associated pleural effusion. Underlying malignancy cannot be excluded. Mild left basilar atelectasis or scarring is noted. Bony thorax is unremarkable. IMPRESSION: Right basilar opacity is noted concerning for pneumonia or atelectasis and associated pleural effusion. Underlying malignancy cannot be excluded. Electronically Signed   By: Marijo Conception M.D.   On: 12/23/2021 14:42    SIGNED: Deatra James, MD, FHM. [facility],  Pager (please use [email or web address] to page/text) Please use Epic Secure Chat for non-urgent communication (7AM-7PM)  If  7PM-7AM, please contact [email or web address] [email or web address], 12/24/2021, 1:21 PM

## 2021-12-24 NOTE — Consult Note (Signed)
NAME:  [name], MRN:  [id number], DOB:  1930/11/24, LOS: 1 ADMISSION DATE:  12/23/2021, CONSULTATION DATE:  12/24/2021 REFERRING MD:  Triad MD, CHIEF COMPLAINT:  hx of lung cancer with pleural effusion R > L    History of Present Illness:  88 year old frail female who is not on oxygen at baseline.  Has multifocal non-small cell lung cancer on Tagrisso since 11/19/2021.  Most recently saw Dr. Julien Nordmann 12/19/2021 with plans for restaging CT scan in 1 month.  She has baseline hyponatremia sodium around 127-133.  On 12/19/2021 sodium was slightly down at 126.  She also has chronic anemia with a hemoglobin between 10-11 g%.  She saw Barrie Folk, PA-C for anemia on 12/23/2021.  Her anemia was stable but her sodium went down to 123. On this visit she complained ofr cough, left pleuritic pain dyspnea x 2 days with new onset hypoxemia 89% RA. WEight up 9# (not taken lasix for pedal edem x few months) .  KNown EF 45% on 12/24/21 since March 2023 (last normal 2020). CT chest 12/23/21 -> R > L pleural effusion . Rt side moderate. Currently on 3L [place]  Past Medical History:  LUng cancer Dr Julien Nordmann last visit 12/19/21 Multifocal non-small cell lung cancer, adenocarcinoma initially diagnosed as stage IIA (T2b, N0, M0) non-small cell lung cancer, adenocarcinoma with positive EGFR mutation in exon 21 (L858R) presented with right upper lobe lung mass diagnosed in March of 2015.   PRIOR THERAPY: Status post right upper lobectomy with wedge resection of the right middle lobe under the care of Dr. Servando Snare on 06/07/2013   CURRENT THERAPY: Tagrisso 80 mg p.o. daily started November 19, 2021.  Status post 4 weeks of treatment.   INTERVAL HISTORY: [name] 88 y.o. female returns to the clinic today for follow-up visit.  The patient is feeling fine today with no concerning complaints except for few blisters in her mouth as well as small crusts in her scalp.  She denied having any significant chest pain,  shortness of breath, cough or hemoptysis.  She has no nausea, vomiting but has alternating episodes of diarrhea and constipation.  She has no recent weight loss or night sweats.  She continues to tolerate her treatment with Tagrisso fairly well.  He is here today for evaluation and repeat blood work.  Plan  - She is currently undergoing treatment with Tagrisso 80 mg p.o. daily started on November 19, 2021, status post 1 month of treatment. She has been tolerating the treatment well except for few blisters in the mouth and mild skin rash on the scalp. I recommended for the patient to continue her current treatment with Tagrisso with the same dose. I will see her back for follow-up visit in 1 months for evaluation with repeat CT scan of the chest for restaging of her disease. For the hyponatremia and the anemia, we will continue to monitor her closely.   has a past medical history of Arthralgia of multiple joints, Arthritis, Cardiomyopathy ([place]), Chronic constipation, Chronic inflammatory arthritis, Dry eyes, GERD (gastroesophageal reflux disease), H/O discoid lupus erythematosus, Hiatal hernia, History of colon polyps, Hypothyroidism, Iron deficiency anemia, LBBB (left bundle branch block) (2010), Mitchell's disease (erythromelalgia) ([place]), Nocturia, Non-small cell cancer of right lung [facility]) (surgeon-- dr gerhardt/  oncologist-  dr Julien Nordmann--- per lov notes no recurrence/   11-18-2017 per pt denies any symptoms), OA (osteoarthritis), Osteoporosis, PONV (postoperative nausea and vomiting), Raynaud's phenomenon (1965), Renal insufficiency, Rheumatoid arthritis ([place]), Sciatica, Scoliosis, and Sjogren's  syndrome ([place]).   reports that she has never smoked. She has been exposed to tobacco smoke. She has never used smokeless tobacco.  Past Surgical History:  Procedure Laterality Date   ANTERIOR HIP REVISION Right 11/27/2017   Procedure: RIGHT HIP ACETABULAR REVISION;  Surgeon: [name], MD;   Location: [facility];  Service: Orthopedics;  Laterality: Right;   ANTERIOR HIP REVISION Right 01/24/2018   Procedure: OPEN REDUCTION OF DISLOCATED ANTERIOR HIP WITH REVISION OF LINER AND HIP BALL;  Surgeon: [name], MD;  Location: [facility];  Service: Orthopedics;  Laterality: Right;   APPENDECTOMY  1950s   BIOPSY  04/14/2018   Procedure: BIOPSY;  Surgeon: Yetta Flock, MD;  Location: [place];  Service: Gastroenterology;;   BIOPSY  04/16/2018   Procedure: BIOPSY;  Surgeon: Irving Copas., MD;  Location: [place];  Service: Gastroenterology;;   CARDIOVASCULAR STRESS TEST  12/2008    mild fixed basal to mid septal perfusion defect felt likely due to artifact from LBBB, no ischemia, EF 58%   COLONOSCOPY     COLONOSCOPY WITH PROPOFOL N/A 04/16/2018   Procedure: COLONOSCOPY WITH PROPOFOL;  Surgeon: Irving Copas., MD;  Location: [place];  Service: Gastroenterology;  Laterality: N/A;   ESOPHAGOGASTRODUODENOSCOPY (EGD) WITH PROPOFOL N/A 04/14/2018   Procedure: ESOPHAGOGASTRODUODENOSCOPY (EGD) WITH PROPOFOL;  Surgeon: Yetta Flock, MD;  Location: [place];  Service: Gastroenterology;  Laterality: N/A;   FEMORAL-POPLITEAL BYPASS GRAFT Right 04/10/2018   Procedure: REPAIR RIGHT FEMORAL ARTERY PSEUDOANEURYSM, RETROPERITONEAL EXPOSURE OF ILIAC ARTERY, RIGHT POPLITEAL EMBOLECTOMY;  Surgeon: Angelia Mould, MD;  Location: [place];  Service: Vascular;  Laterality: Right;   FLEXIBLE SIGMOIDOSCOPY N/A 06/17/2019   Procedure: FLEXIBLE SIGMOIDOSCOPY;  Surgeon: Lavena Bullion, DO;  [facility];  Service: Gastroenterology;  Laterality: N/A;   HEMOSTASIS CLIP PLACEMENT  06/17/2019   Procedure: HEMOSTASIS CLIP PLACEMENT;  Surgeon: Lavena Bullion, DO;  [facility];  Service: Gastroenterology;;   LYMPH NODE DISSECTION Right 06/07/2013   Procedure: LYMPH NODE DISSECTION;  Surgeon: [name], MD;  Location: [place];  Service:  Thoracic;  Laterality: Right;   PATCH ANGIOPLASTY Right 04/10/2018   Procedure: PATCH  ANGIOPLASTY OF RIGHT FEMORAL ARTERY USING BOVINE PATCH, PATCH ANGIOPLASTY OF RIGHT POPLITEAL ARTERY USING BOVINE PATCH;  Surgeon: Angelia Mould, MD;  Location: [place];  Service: Vascular;  Laterality: Right;   SCHLEROTHERAPY  06/17/2019   Procedure: [place] OF VARICES;  Surgeon: Lavena Bullion, DO;  [facility];  Service: Gastroenterology;;   [place]   "large incision from chest to up to shoulder, the nerves were tied together, for raynaud's   THORACOTOMY  06/07/2013   Procedure: MINI/LIMITED THORACOTOMY; right middle lobe wedge resection;  Surgeon: [name], MD;  Location: [place];  Service: Thoracic;;   TONSILLECTOMY  child   TOTAL ABDOMINAL HYSTERECTOMY  1980's    W/ BSO   TOTAL HIP ARTHROPLASTY Right 04/28/2014   Procedure: RIGHT TOTAL HIP ARTHROPLASTY ANTERIOR APPROACH;  Surgeon: [name], MD;  [facility];  Service: Orthopedics;  Laterality: Right;   TRANSTHORACIC ECHOCARDIOGRAM  12/11/2008   ef 16-38%, grade 1 diastolic dysfunction/  mild LAE/  mild AR and MR/  trivial TR   VIDEO ASSISTED THORACOSCOPY (VATS)/WEDGE RESECTION Right 06/07/2013   Procedure: VIDEO ASSISTED THORACOSCOPY (VATS)/right upper lobectomy, On Q;  Surgeon: [name], MD;  [facility];  Service: Thoracic;  Laterality: Right;   VIDEO BRONCHOSCOPY N/A  06/07/2013   Procedure: VIDEO BRONCHOSCOPY;  Surgeon: [name], MD;  Location: [facility];  Service: Thoracic;  Laterality: N/A;   VIDEO BRONCHOSCOPY WITH ENDOBRONCHIAL NAVIGATION N/A 05/04/2013   Procedure: VIDEO BRONCHOSCOPY WITH ENDOBRONCHIAL NAVIGATION;  Surgeon: [name], MD;  Location: [facility];  Service: Thoracic;  Laterality: N/A;    Allergies  Allergen Reactions   Amlodipine Rash   Prochlorperazine Edisylate Anaphylaxis    Compazine--- tongue swells and rash   Aspirin Other (See  Comments)    nose bleeds. Cannot take NSAIDS    Cymbalta [Duloxetine Hcl] Diarrhea, Nausea And Vomiting and Other (See Comments)    Increased blood pressure   Pamelor [Nortriptyline Hcl] Diarrhea and Nausea Only    Increased Heart rate and BP    Immunization History  Administered Date(s) Administered   Fluad Quad(high Dose 65+) 11/03/2018, 12/14/2019   Influenza Split 11/21/2011   Influenza Whole 11/29/2007, 11/29/2008, 11/29/2009   Influenza, High Dose Seasonal PF 10/30/2012, 01/02/2015, 11/13/2016, 11/12/2017   Influenza,inj,Quad PF,6+ Mos 11/22/2013, 11/14/2015   Influenza-Unspecified 11/13/2016, 11/12/2017, 12/14/2020   PFIZER(Purple Top)SARS-COV-2 Vaccination 04/12/2019, 05/03/2019, 01/20/2020, 12/14/2020   Pneumococcal Conjugate-13 05/22/2015   Pneumococcal Polysaccharide-23 06/13/2013   Tdap 08/17/2017   Zoster, Live 01/27/2014    Family History  Problem Relation Age of Onset   Coronary artery disease Father    Colon cancer Father    Diabetes Father    Cancer Father        colon   Other Mother 91       MVA   Healthy Sister    Healthy Brother    Healthy Daughter    Hypothyroidism Daughter    Other Brother        killed in war   Pneumonia Sister    Healthy Daughter    Esophageal cancer Neg Hx    Kidney disease Neg Hx    Liver disease Neg Hx      Current Facility-Administered Medications:    0.9 %  sodium chloride infusion, , Intravenous, PRN, Shahmehdi, Seyed A, MD, Last Rate: 10 mL/hr at 12/24/21 1702, New Bag at 12/24/21 1702   azithromycin (ZITHROMAX) tablet 500 mg, 500 mg, Oral, Daily, Shahmehdi, Seyed A, MD, 500 mg at 12/24/21 0931   calcium-vitamin D (OSCAL WITH D) 500-5 MG-MCG per tablet 1 tablet, 1 tablet, Oral, Daily, Tu, Ching T, DO, 1 tablet at 12/24/21 1134   carvedilol (COREG) tablet 3.125 mg, 3.125 mg, Oral, BID WC, Tu, Ching T, DO, 3.125 mg at 12/24/21 1711   cefTRIAXone (ROCEPHIN) 1 g in sodium chloride 0.9 % 100 mL IVPB, 1 g, Intravenous, Q24H,  Tu, Ching T, DO, Last Rate: 200 mL/hr at 12/24/21 1703, 1 g at 12/24/21 1703   Chlorhexidine Gluconate Cloth 2 % PADS 6 each, 6 each, Topical, Daily, Shahmehdi, Seyed A, MD   docusate sodium (COLACE) capsule 100 mg, 100 mg, Oral, BID, Tu, Ching T, DO, 100 mg at 12/24/21 0931   enoxaparin (LOVENOX) injection 30 mg, 30 mg, Subcutaneous, Q24H, Tu, Ching T, DO, 30 mg at 12/24/21 0009   furosemide (LASIX) injection 40 mg, 40 mg, Intravenous, Daily, Tu, Ching T, DO, 40 mg at 12/24/21 0932   gabapentin (NEURONTIN) capsule 300 mg, 300 mg, Oral, BID, Tu, Ching T, DO, 300 mg at 12/24/21 1544   gabapentin (NEURONTIN) capsule 600 mg, 600 mg, Oral, QHS, Tu, Ching T, DO, 600 mg at 12/24/21 0014   ipratropium-albuterol (DUONEB) 0.5-2.5 (3) MG/3ML nebulizer solution 3 mL, 3 mL, Nebulization,  Q6H, Shahmehdi, Valeria Batman, MD   levothyroxine (SYNTHROID) tablet 75 mcg, 75 mcg, Oral, QAC breakfast, Tu, Ching T, DO, 75 mcg at 12/24/21 0547   methocarbamol (ROBAXIN) tablet 500 mg, 500 mg, Oral, Q6H PRN, Tu, Ching T, DO   ondansetron (ZOFRAN-ODT) disintegrating tablet 4 mg, 4 mg, Oral, Q8H PRN, Shahmehdi, Seyed A, MD   Oral care mouth rinse, 15 mL, Mouth Rinse, PRN, Shahmehdi, Seyed A, MD   [START ON 12/26/2021] osimertinib mesylate (TAGRISSO) tablet 80 mg, 80 mg, Oral, Daily, Shahmehdi, Seyed A, MD   pantoprazole (PROTONIX) EC tablet 40 mg, 40 mg, Oral, Daily, Tu, Ching T, DO, 40 mg at 12/24/21 0931   sodium chloride tablet 1 g, 1 g, Oral, Daily, Shahmehdi, Seyed A, MD, 1 g at 12/24/21 0931   traMADol (ULTRAM) tablet 50 mg, 50 mg, Oral, Q8H, Tu, Ching T, DO, 50 mg at 12/24/21 [place] [facility] Events:  12/23/2021 - admit 12/24/21 - [facility]m consul;t   Interim History / Subjective:   12/24/2021 - seen in bed [address]  Objective   Blood pressure 121/73, pulse 87, temperature (!) 97.5 F (36.4 C), temperature source Oral, resp. rate 18, height 5' (1.524 m), weight 51.3 kg, SpO2 92 %.        Intake/Output  Summary (Last 24 hours) at 12/24/2021 1714 Last data filed at 12/23/2021 1731 Gross per 24 hour  Intake 100 ml  Output --  Net 100 ml   Filed Weights   12/23/21 1622 12/24/21 1536  Weight: 51.7 kg 51.3 kg    Examination: General: frail, osteoprorotic, plesaant HENT: on 3L [place] Lungs: CTA bilaterally Cardiovascular: normal heart sounds Abdomen: soft Extremities: intact Neuro: axox3 GU: not examned  Resolved Hospital Problem list   x  Assessment & Plan:    Background of chronic systolic CHF - ef 40% Known  Moderate Hiatal Hernia Knwn NSCLC  - multiple lung nodule - on tagriss since Oct 2023  Current  Acute hypoxemic resp failure - mild to moderate 3L [place] (PE Ruled out) Rt pleural effusion  > L Pleural effusion - new since 11/01/21 renaal CT and 09/27/21 ChestCt2   12/24/2021 -> Ddx - infection v cardiac, On CT - Rt effusion looks exudate but BNP 874 and very elevated (baseline 333 in 2020)  P:   Check RVP, urine strep, urine leg and PCT US guided R thora diagnostic - if enough amount O2 for pulse ox > 92% Diurese per primary service Abx per prmary service  Anti-infectives (From admission, onward)    Start     Dose/Rate Route Frequency Ordered Stop   12/24/21 1800  azithromycin (ZITHROMAX) 500 mg in sodium chloride 0.9 % 250 mL IVPB  Status:  Discontinued        500 mg 250 mL/hr over 60 Minutes Intravenous Every 24 hours 12/23/21 2141 12/24/21 0709   12/24/21 1700  cefTRIAXone (ROCEPHIN) 1 g in sodium chloride 0.9 % 100 mL IVPB        1 g 200 mL/hr over 30 Minutes Intravenous Every 24 hours 12/23/21 2141     12/24/21 1000  azithromycin (ZITHROMAX) tablet 500 mg        500 mg Oral Daily 12/24/21 0709     12/23/21 1645  cefTRIAXone (ROCEPHIN) 1 g in sodium chloride 0.9 % 100 mL IVPB        1 g 200 mL/hr over 30 Minutes Intravenous  Once 12/23/21 1632 12/23/21 1731   12/23/21 1645  azithromycin (  ZITHROMAX) 500 mg in sodium chloride 0.9 % 250 mL IVPB        500 mg 250  mL/hr over 60 Minutes Intravenous  Once 12/23/21 1632 12/23/21 2130          Best practice (daily eval):  Per triad Daughter and patient updated    SIGNATURE    Dr. Brand Males, M.D., F.C.C.P,  Pulmonary and Critical Care Medicine Staff Physician, [place] Director - Interstitial Lung Disease  Program  Pulmonary [facility] [place], [place], [address]  NPI Number:  NPI #[id number]  Pager: [phone], If no answer  -> Check AMION or Try [phone] Telephone (clinical office): [phone] Telephone (research): [phone]  5:14 PM 12/24/2021   12/24/2021 5:14 PM    LABS    PULMONARY No results for input(s): "PHART", "PCO2ART", "PO2ART", "HCO3", "TCO2", "O2SAT" in the last 168 hours.  Invalid input(s): "PCO2", "PO2"  CBC Recent Labs  Lab 12/19/21 1041 12/23/21 1458 12/24/21 0458  HGB 11.0* 10.8* 9.1*  HCT 33.0* 32.6* 27.6*  WBC 6.2 6.3 6.7  PLT 152 169 148*    COAGULATION No results for input(s): "INR" in the last 168 hours.  CARDIAC  No results for input(s): "TROPONINI" in the last 168 hours. No results for input(s): "PROBNP" in the last 168 hours.  CHEMISTRY Recent Labs  Lab 12/19/21 1041 12/23/21 1458 12/24/21 0458  NA 126* 121* 123*  K 5.1 4.8 4.7  CL 93* 87* 92*  CO2 _0 GLUCOSE 87 124* 90  BUN 26* 19 20  CREATININE 1.10* 1.03* 1.15*  CALCIUM 9.2 9.3 8.1*   Estimated Creatinine Clearance: 23.8 mL/min (A) (by C-G formula based on SCr of 1.15 mg/dL (H)).   LIVER Recent Labs  Lab 12/19/21 1041 12/23/21 1458  AST 29 27  ALT 13 12  ALKPHOS 31* 34*  BILITOT 0.3 0.4  PROT 7.2 7.1  ALBUMIN 4.2 4.1     INFECTIOUS Recent Labs  Lab 12/23/21 1648 12/23/21 2128  LATICACIDVEN 1.2  --   PROCALCITON  --  <0.10     ENDOCRINE CBG (last 3)  No results for input(s): "GLUCAP" in the last 72 hours.       IMAGING x48h  - image(s) personally  visualized  -   highlighted in bold ECHOCARDIOGRAM COMPLETE  Result Date: 12/24/2021    ECHOCARDIOGRAM REPORT   Patient Name:   [name] Date of Exam: 12/24/2021 Medical Rec #:  [id number]        Height:       60.0 in Accession #:    [id number]       Weight:       114.0 lb Date of Birth:  October 05, 1930        BSA:          1.470 m Patient Age:    39 years         BP:           116/70 mmHg Patient Gender: F                HR:           74 bpm. Exam Location:  Inpatient Procedure: 2D Echo, 3D Echo, Cardiac Doppler and Color Doppler Indications:    I50.40* Unspecified combined systolic (congestive) and diastolic                 (congestive) heart  failure  History:        Patient has prior history of Echocardiogram examinations, most                 recent 04/17/2021. CHF, Signs/Symptoms:Shortness of Breath,                 Dyspnea and Edema; Risk Factors:Hypertension and Dyslipidemia.                 Lung cancer.  Sonographer:    Roseanna Rainbow RDCS Referring Phys: [id number] [place]  1. Left ventricular ejection fraction, by estimation, is 40 to 45%. Left ventricular ejection fraction by 3D volume is 45 %. The left ventricle has mildly decreased function. The left ventricle demonstrates global hypokinesis. Left ventricular diastolic  parameters are consistent with Grade II diastolic dysfunction (pseudonormalization). Elevated left atrial pressure.  2. Right ventricular systolic function is normal. The right ventricular size is normal. There is normal pulmonary artery systolic pressure. The estimated right ventricular systolic pressure is 73.7 mmHg.  3. Left atrial size was moderately dilated.  4. Right atrial size was mildly dilated.  5. Large pleural effusion in the left lateral region.  6. 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 Severe mitral valve regurgitation due to central malcoaptation with flow reversal in the pulmonary veins and triangular "v-wave cutoff" spectral  configuration.  7. The aortic valve is tricuspid. There is mild thickening of the aortic valve. Aortic valve regurgitation is trivial. Aortic valve sclerosis is present, with no evidence of aortic valve stenosis.  8. The inferior vena cava is normal in size with <50% respiratory variability, suggesting right atrial pressure of 8 mmHg. Comparison(s): A prior study was performed on 04/17/2021. Prior images reviewed side by side. The left ventricular function is unchanged. There is marked worsening of the mitral insufficiency. The leaflets are restricted and there is central malcoaptation. LBBB is present but there does not appear to be severe LV septal-lateral dyssynchrony. FINDINGS  Left Ventricle: Left ventricular ejection fraction, by estimation, is 40 to 45%. Left ventricular ejection fraction by 3D volume is 45 %. The left ventricle has mildly decreased function. The left ventricle demonstrates global hypokinesis. The left ventricular internal cavity size was normal in size. There is no left ventricular hypertrophy. Abnormal (paradoxical) septal motion, consistent with left bundle branch block. Left ventricular diastolic parameters are consistent with Grade II diastolic dysfunction (pseudonormalization). Elevated left atrial pressure. Right Ventricle: The right ventricular size is normal. No increase in right ventricular wall thickness. Right ventricular systolic function is normal. There is normal pulmonary artery systolic pressure. The tricuspid regurgitant velocity is 2.45 m/s, and  with an assumed right atrial pressure of 8 mmHg, the estimated right ventricular systolic pressure is 10.6 mmHg. Left Atrium: Left atrial size was moderately dilated. Right Atrium: Right atrial size was mildly dilated. Pericardium: There is no evidence of pericardial effusion. Mitral Valve: The mitral valve is rheumatic. There is mild thickening of the anterior and posterior mitral valve leaflet(s). Severe mitral valve  regurgitation, with centrally-directed jet. MV peak gradient, 6.4 mmHg. The mean mitral valve gradient is 3.0  mmHg. Tricuspid Valve: The tricuspid valve is normal in structure. Tricuspid valve regurgitation is trivial. Aortic Valve: The aortic valve is tricuspid. There is mild thickening of the aortic valve. Aortic valve regurgitation is trivial. Aortic regurgitation PHT measures 459 msec. Aortic valve sclerosis is present, with no evidence of aortic valve stenosis. Pulmonic Valve: The pulmonic valve was grossly normal. Pulmonic valve  regurgitation is not visualized. Aorta: The aortic root and ascending aorta are structurally normal, with no evidence of dilitation. Venous: A pattern of systolic flow reversal, suggestive of severe mitral regurgitation is recorded from the right upper pulmonary vein. The inferior vena cava is normal in size with less than 50% respiratory variability, suggesting right atrial pressure of 8 mmHg. IAS/Shunts: No atrial level shunt detected by color flow Doppler. Additional Comments: There is a large pleural effusion in the left lateral region.  LEFT VENTRICLE PLAX 2D LVIDd:         4.80 cm         Diastology LVIDs:         3.40 cm         LV e' medial:    5.87 cm/s LV PW:         0.80 cm         LV E/e' medial:  18.6 LV IVS:        0.80 cm         LV e' lateral:   10.40 cm/s LVOT diam:     2.10 cm         LV E/e' lateral: 10.5 LV SV:         61 LV SV Index:   41 LVOT Area:     3.46 cm        3D Volume EF                                LV 3D EF:    Left                                             ventricul LV Volumes (MOD)                            ar LV vol d, MOD    128.0 ml                   ejection A2C:                                        fraction LV vol d, MOD    100.3 ml                   by 3D A4C:                                        volume is LV vol s, MOD    64.7 ml                    45 %. A2C: LV vol s, MOD    64.8 ml A4C:                           3D Volume EF: LV SV  MOD A2C:   63.3 ml       3D EF:        45 % LV SV MOD A4C:   100.3 ml  LV EDV:       134 ml LV SV MOD BP:    52.6 ml       LV ESV:       74 ml                                LV SV:        60 ml RIGHT VENTRICLE             IVC RV S prime:     12.60 cm/s  IVC diam: 1.60 cm TAPSE (M-mode): 2.0 cm LEFT ATRIUM             Index        RIGHT ATRIUM           Index LA diam:        4.40 cm 2.99 cm/m   RA Area:     12.50 cm LA Vol (A2C):   43.7 ml 29.73 ml/m  RA Volume:   26.30 ml  17.89 ml/m LA Vol (A4C):   54.4 ml 37.01 ml/m LA Biplane Vol: 53.0 ml 36.06 ml/m  AORTIC VALVE LVOT Vmax:   88.60 cm/s LVOT Vmean:  59.600 cm/s LVOT VTI:    0.175 m AI PHT:      459 msec  AORTA Ao Root diam: 3.20 cm Ao Asc diam:  3.00 cm MITRAL VALVE                  TRICUSPID VALVE MV Area (PHT): 4.30 cm       TR Peak grad:   24.0 mmHg MV Area VTI:   1.54 cm       TR Vmax:        245.00 cm/s MV Peak grad:  6.4 mmHg MV Mean grad:  3.0 mmHg       SHUNTS MV Vmax:       1.26 m/s       Systemic VTI:  0.18 m MV Vmean:      81.5 cm/s      Systemic Diam: 2.10 cm MV Decel Time: 176 msec MR Peak grad:    62.1 mmHg MR Mean grad:    30.5 mmHg MR Vmax:         394.00 cm/s MR Vmean:        242.5 cm/s MR PISA:         4.02 cm MR PISA Eff ROA: 39 mm MR PISA Radius:  0.80 cm MV E velocity: 109.00 cm/s MV A velocity: 65.47 cm/s MV E/A ratio:  1.66 Mihai Croitoru MD Electronically signed by Sanda Klein MD Signature Date/Time: 12/24/2021/11:04:46 AM    Final    CT Angio Chest PE W/Cm &/Or Wo Cm  Result Date: 12/23/2021 CLINICAL DATA:  Right upper lobe non-small cell lung cancer, status post right upper lobectomy and right middle lobe wedge resection. Shortness of breath for 1 week.  Dyspnea at rest. * Tracking Code: BO * EXAM: CT ANGIOGRAPHY CHEST WITH CONTRAST TECHNIQUE: Multidetector CT imaging of the chest was performed using the standard protocol during bolus administration of intravenous contrast. Multiplanar CT image reconstructions and  MIPs were obtained to evaluate the vascular anatomy. RADIATION DOSE REDUCTION: This exam was performed according to the departmental dose-optimization program which includes automated exposure control, adjustment of the mA and/or kV according to patient size and/or use of iterative reconstruction technique. CONTRAST:  64m OMNIPAQUE IOHEXOL 350 MG/ML SOLN COMPARISON:  09/27/2021 FINDINGS: Cardiovascular: No filling defect is identified in the pulmonary arterial tree to suggest pulmonary embolus. Aortic atherosclerosis.  Mild cardiomegaly. Mediastinum/Nodes: Partial obscuration of mediastinal structures due to the very dense contrast medium in the SVC and brachiocephalic vein. No overt pathologic adenopathy identified. Moderate-sized hiatal hernia. Lungs/Pleura: Moderate right pleural effusion, cannot exclude exudative/complex component. Small left pleural effusion. Postoperative findings in the right lung. Confluent ground-glass density opacities scattered in the right lower lobe, obscuring the previous ground-glass density nodules to some degree. Substantial passive atelectasis in the right lung. Emphysema noted. Patchy ground-glass densities in the left upper lobe corresponding to secondary pulmonary lobules with associated septal thickening in the apex compatible with interstitial edema and mild airspace edema. Passive atelectasis in the left lower lobe. Reduce conspicuity of some of the previous pulmonary nodules although some of this reduced conspicuity may be due to surrounding ground-glass opacities and also from motion artifact. I suspect that many of these pulmonary nodules are still present. Upper Abdomen: Unremarkable Musculoskeletal: Dextroconvex lumbar scoliosis. Severe degenerative glenohumeral arthropathy bilaterally. No compelling findings of osseous metastatic disease. Review of the MIP images confirms the above findings. IMPRESSION: 1. No filling defect is identified in the pulmonary arterial tree  to suggest pulmonary embolus. 2. New moderate right pleural effusion, cannot exclude exudative/complex component. New small left pleural effusion. 3. Interstitial edema and scattered ground-glass density opacities which along with motion artifact is thought to be obscuring some of the previous pulmonary nodules. 4. Mild cardiomegaly. 5. Moderate-sized hiatal hernia. 6. Severe degenerative glenohumeral arthropathy bilaterally. 7. Aortic atherosclerosis. Aortic Atherosclerosis (ICD10-I70.0). Electronically Signed   By: Van Clines M.D.   On: 12/23/2021 18:14   DG Chest 2 View  Result Date: 12/23/2021 CLINICAL DATA:  Cough, lung cancer. EXAM: CHEST - 2 VIEW COMPARISON:  July 29, 2014.  September 30, 2021. FINDINGS: Stable cardiomediastinal silhouette. Right basilar opacity is noted concerning for pneumonia or atelectasis and associated pleural effusion. Underlying malignancy cannot be excluded. Mild left basilar atelectasis or scarring is noted. Bony thorax is unremarkable. IMPRESSION: Right basilar opacity is noted concerning for pneumonia or atelectasis and associated pleural effusion. Underlying malignancy cannot be excluded. Electronically Signed   By: Marijo Conception M.D.   On: 12/23/2021 14:42

## 2021-12-24 NOTE — ED Notes (Signed)
Pt given lunch tray.

## 2021-12-25 ENCOUNTER — Inpatient Hospital Stay (HOSPITAL_COMMUNITY): Payer: Medicare PPO

## 2021-12-25 ENCOUNTER — Inpatient Hospital Stay (HOSPITAL_COMMUNITY)
Admission: RE | Admit: 2021-12-25 | Discharge: 2021-12-25 | Disposition: A | Discharge status: Home / Self Care | Payer: Medicare PPO | Source: Ambulatory Visit | Attending: Rheumatology | Admitting: Rheumatology

## 2021-12-25 DIAGNOSIS — N1831 Chronic kidney disease, stage 3a: Secondary | ICD-10-CM

## 2021-12-25 DIAGNOSIS — J9601 Acute respiratory failure with hypoxia: Secondary | ICD-10-CM | POA: Diagnosis not present

## 2021-12-25 DIAGNOSIS — I340 Nonrheumatic mitral (valve) insufficiency: Secondary | ICD-10-CM | POA: Diagnosis not present

## 2021-12-25 DIAGNOSIS — E871 Hypo-osmolality and hyponatremia: Secondary | ICD-10-CM | POA: Diagnosis not present

## 2021-12-25 DIAGNOSIS — I509 Heart failure, unspecified: Secondary | ICD-10-CM

## 2021-12-25 DIAGNOSIS — R0603 Acute respiratory distress: Secondary | ICD-10-CM

## 2021-12-25 DIAGNOSIS — C3490 Malignant neoplasm of unspecified part of unspecified bronchus or lung: Secondary | ICD-10-CM

## 2021-12-25 DIAGNOSIS — I5043 Acute on chronic combined systolic (congestive) and diastolic (congestive) heart failure: Secondary | ICD-10-CM | POA: Diagnosis not present

## 2021-12-25 LAB — CBC
HCT: 29.5 % — ABNORMAL LOW (ref 36.0–46.0)
Hemoglobin: 9.5 g/dL — ABNORMAL LOW (ref 12.0–15.0)
MCH: 29.2 pg (ref 26.0–34.0)
MCHC: 32.2 g/dL (ref 30.0–36.0)
MCV: 90.8 fL (ref 80.0–100.0)
Platelets: 162 10*3/uL (ref 150–400)
RBC: 3.25 MIL/uL — ABNORMAL LOW (ref 3.87–5.11)
RDW: 13.1 % (ref 11.5–15.5)
WBC: 7.9 10*3/uL (ref 4.0–10.5)
nRBC: 0 % (ref 0.0–0.2)

## 2021-12-25 LAB — BASIC METABOLIC PANEL
Anion gap: 12 (ref 5–15)
BUN: 19 mg/dL (ref 8–23)
CO2: 23 mmol/L (ref 22–32)
Calcium: 8 mg/dL — ABNORMAL LOW (ref 8.9–10.3)
Chloride: 88 mmol/L — ABNORMAL LOW (ref 98–111)
Creatinine, Ser: 1.17 mg/dL — ABNORMAL HIGH (ref 0.44–1.00)
GFR, Estimated: 45 mL/min — ABNORMAL LOW (ref >60)
Glucose, Bld: 88 mg/dL (ref 70–99)
Potassium: 4 mmol/L (ref 3.5–5.1)
Sodium: 123 mmol/L — ABNORMAL LOW (ref 135–145)

## 2021-12-25 LAB — TROPONIN I (HIGH SENSITIVITY): Troponin I (High Sensitivity): 10 ng/L (ref <18)

## 2021-12-25 LAB — PROTEIN, PLEURAL OR PERITONEAL FLUID: Total protein, fluid: <3 g/dL

## 2021-12-25 LAB — GRAM STAIN

## 2021-12-25 LAB — BODY FLUID CELL COUNT WITH DIFFERENTIAL
Eos, Fluid: 0 %
Lymphs, Fluid: 54 %
Monocyte-Macrophage-Serous Fluid: 24 % — ABNORMAL LOW (ref 50–90)
Neutrophil Count, Fluid: 22 % (ref 0–25)
Total Nucleated Cell Count, Fluid: 348 cu mm (ref 0–1000)

## 2021-12-25 LAB — MAGNESIUM: Magnesium: 2 mg/dL (ref 1.7–2.4)

## 2021-12-25 LAB — PHOSPHORUS: Phosphorus: 4.2 mg/dL (ref 2.5–4.6)

## 2021-12-25 LAB — STREP PNEUMONIAE URINARY ANTIGEN: Strep Pneumo Urinary Antigen: NEGATIVE

## 2021-12-25 MED ORDER — IPRATROPIUM-ALBUTEROL 0.5-2.5 (3) MG/3ML IN SOLN
3.0000 mL | Freq: Two times a day (BID) | RESPIRATORY_TRACT | Status: DC
  Administered 2021-12-25 – 2021-12-26 (×2): 3 mL via RESPIRATORY_TRACT
  Filled 2021-12-25 (×2): qty 3

## 2021-12-25 MED ORDER — TRAMADOL HCL 50 MG PO TABS
50.0000 mg | ORAL_TABLET | Freq: Two times a day (BID) | ORAL | Status: DC | PRN
  Administered 2021-12-25 – 2021-12-27 (×3): 50 mg via ORAL
  Filled 2021-12-25 (×3): qty 1

## 2021-12-25 MED ORDER — LIDOCAINE HCL 1 % IJ SOLN
INTRAMUSCULAR | Status: AC
  Filled 2021-12-25: qty 20

## 2021-12-25 MED ORDER — LIDOCAINE HCL 1 % IJ SOLN
INTRAMUSCULAR | Status: AC
  Filled 2021-12-25: qty 10

## 2021-12-25 NOTE — Evaluation (Addendum)
Physical Therapy Evaluation Patient Details Name: [name] MRN: [id number] DOB: [birth year] Today's Date: 12/25/2021  History of Present Illness  [name] is a 88 y.o. female with medical history significant of multifocal non-small cell lung cancer, dilated cardiomyopathy, combined systolic and diastolic HF, HTN, seronegative RA,osteoporosis,sicca syndrome, raynaud'sdisease, right hip surgeries, anemia of chronic disease who presents 12/23/21  with hypoxia and shortness of breath.CXR showing right basilar opacity concerning for pneumonia vs atelectasis and pleural effusion. Also noted to have acute on chronic LE edema. S/P thoracentesis 12/25/21.  Clinical Impression  Pt admitted with above diagnosis.  Pt currently with functional limitations due to the deficits listed below (see PT Problem List). Pt will benefit from skilled PT to increase their independence and safety with mobility to allow discharge to the venue listed below.    The patient Is very motivated, Ambulated on 3 LPM   to and from BR using Rw,  2 assisting for safety and lines/O2. Patient's daughter present. Plans will be  to return  home with family support. With family support. SPO2 on 2 LPM >90%, at rest 96%. HR 101up to 112.         Recommendations for follow up therapy are one component of a multi-disciplinary discharge planning process, led by the attending physician.  Recommendations may be updated based on patient status, additional functional criteria and insurance authorization.  Follow Up Recommendations Home health PT      Assistance Recommended at Discharge Frequent or constant Supervision/Assistance  Patient can return home with the following  A little help with walking and/or transfers;A little help with bathing/dressing/bathroom;Assistance with cooking/housework;Assist for transportation;Help with stairs or ramp for entrance    Equipment Recommendations None recommended by PT  Recommendations for  Other Services       Functional Status Assessment Patient has had a recent decline in their functional status and demonstrates the ability to make significant improvements in function in a reasonable and predictable amount of time.     Precautions / Restrictions Precautions Precautions: Fall Precaution Comments: monitor sats/HR, on 3 LPM Restrictions Weight Bearing Restrictions: No      Mobility  Bed Mobility Overal bed mobility: Needs Assistance Bed Mobility: Supine to Sit     Supine to sit: Min assist     General bed mobility comments: assist for lines,    Transfers Overall transfer level: Needs assistance Equipment used: Rolling walker (2 wheels) Transfers: Sit to/from Stand Sit to Stand: Min assist           General transfer comment: slight posterior bias    Ambulation/Gait Ambulation/Gait assistance: Min assist, +2 safety/equipment Gait Distance (Feet): 15 Feet (then 20') Assistive device: Rolling walker (2 wheels) Gait Pattern/deviations: Step-to pattern, Step-through pattern, Decreased stride length Gait velocity: decr     General Gait Details: slow to step, able  to turn  at toilet, assisting with lines  Stairs            Wheelchair Mobility    Modified Rankin (Stroke Patients Only)       Balance Overall balance assessment: Needs assistance Sitting-balance support: Bilateral upper extremity supported, Feet supported Sitting balance-Leahy Scale: Fair     Standing balance support: Bilateral upper extremity supported, During functional activity, Reliant on assistive device for balance Standing balance-Leahy Scale: Poor                               Pertinent Vitals/Pain Pain  Assessment Pain Assessment: No/denies pain    Home Living Family/patient expects to be discharged to:: Private residence Living Arrangements: Alone Available Help at Discharge: Family;Personal care attendant;Available PRN/intermittently Type of  Home: House Home Access: Stairs to enter Entrance Stairs-Rails: Right;Left Entrance Stairs-Number of Steps: 3 back steps, 2 steps in front with no rails   Home Layout: Two level Home Equipment: Rollator (4 wheels);Rolling Walker (2 wheels);Shower seat - built in;Cane - single point;Grab bars - tub/shower;Grab bars - toilet;Hand held shower head Additional Comments: caregiver bathes her on saturday    Prior Function Prior Level of Function : Independent/Modified Independent             Mobility Comments: uses rollator, has not driven for several weeks ADLs Comments: independent except for showering     Hand Dominance   Dominant Hand: Right    Extremity/Trunk Assessment        Lower Extremity Assessment Lower Extremity Assessment: Generalized weakness    Cervical / Trunk Assessment Cervical / Trunk Assessment: Kyphotic (scoliotic)  Communication   Communication: No difficulties  Cognition Arousal/Alertness: Awake/alert Behavior During Therapy: WFL for tasks assessed/performed Overall Cognitive Status: Within Functional Limits for tasks assessed                                          General Comments      Exercises     Assessment/Plan    PT Assessment Patient needs continued PT services  PT Problem List Decreased strength;Decreased activity tolerance;Decreased mobility;Cardiopulmonary status limiting activity;Decreased balance;Decreased knowledge of precautions       PT Treatment Interventions DME instruction;Therapeutic activities;Balance training;Gait training;Functional mobility training;Therapeutic exercise;Patient/family education    PT Goals (Current goals can be found in the Care Plan section)  Acute Rehab PT Goals Patient Stated Goal: go home PT Goal Formulation: With patient/family Time For Goal Achievement: 01/08/22 Potential to Achieve Goals: Good    Frequency Min 3X/week     Co-evaluation PT/OT/SLP  Co-Evaluation/Treatment: Yes Reason for Co-Treatment: For patient/therapist safety;To address functional/ADL transfers PT goals addressed during session: Mobility/safety with mobility OT goals addressed during session: ADL's and self-care       AM-PAC PT "6 Clicks" Mobility  Outcome Measure Help needed turning from your back to your side while in a flat bed without using bedrails?: A Little Help needed moving from lying on your back to sitting on the side of a flat bed without using bedrails?: A Little Help needed moving to and from a bed to a chair (including a wheelchair)?: A Little Help needed standing up from a chair using your arms (e.g., wheelchair or bedside chair)?: A Little Help needed to walk in hospital room?: A Lot Help needed climbing 3-5 steps with a railing? : Total 6 Click Score: 15    End of Session Equipment Utilized During Treatment: Gait belt Activity Tolerance: Patient tolerated treatment well Patient left: in chair;with call bell/phone within reach;with bed alarm set;with family/visitor present Nurse Communication: Mobility status PT Visit Diagnosis: Unsteadiness on feet (R26.81);Difficulty in walking, not elsewhere classified (R26.2)    Time: 1430-1500 PT Time Calculation (min) (ACUTE ONLY): 30 min   Charges:   PT Evaluation $PT Eval Low Complexity: 1 Low          [place] Office [phone] Weekend [phone]   [name] 12/25/2021, 4:25 PM

## 2021-12-25 NOTE — Progress Notes (Addendum)
NAME:  [name], MRN:  [id number], DOB:  1930-12-15, LOS: 2 ADMISSION DATE:  12/23/2021, CONSULTATION DATE:  12/24/2021 REFERRING MD:  Triad MD, CHIEF COMPLAINT:  hx of lung cancer with pleural effusion R > L    BRIEF  88 year old frail female who is not on oxygen at baseline.  Has multifocal non-small cell lung cancer on Tagrisso since 11/19/2021.  Most recently saw Dr. Julien Nordmann 12/19/2021 with plans for restaging CT scan in 1 month.  She has baseline hyponatremia sodium around 127-133.  On 12/19/2021 sodium was slightly down at 126.  She also has chronic anemia with a hemoglobin between 10-11 g%.  She saw Barrie Folk, PA-C for anemia on 12/23/2021.  Her anemia was stable but her sodium went down to 123. On this visit she complained ofr cough, left pleuritic pain dyspnea x 2 days with new onset hypoxemia 89% RA. WEight up 9# (not taken lasix for pedal edem x few months) .  KNown EF 45% on 12/24/21 since March 2023 (last normal 2020). CT chest 12/23/21 -> R > L pleural effusion . Rt side moderate. Currently on 3L [place]  Past Medical History:  LUng cancer Dr Julien Nordmann last visit 12/19/21 Multifocal non-small cell lung cancer, adenocarcinoma initially diagnosed as stage IIA (T2b, N0, M0) non-small cell lung cancer, adenocarcinoma with positive EGFR mutation in exon 21 (L858R) presented with right upper lobe lung mass diagnosed in March of 2015.   PRIOR THERAPY: Status post right upper lobectomy with wedge resection of the right middle lobe under the care of Dr. Servando Snare on 06/07/2013   CURRENT THERAPY: Tagrisso 80 mg p.o. daily started November 19, 2021.  Status post 4 weeks of treatment.   INTERVAL HISTORY:  feeling fine today Plan  - She is currently undergoing treatment with Tagrisso 80 mg p.o. daily started on November 19, 2021, status post 1 month of treatment. She has been tolerating the treatment well except for few blisters in the mouth and mild skin rash on the scalp. I recommended for  the patient to continue her current treatment with Tagrisso with the same dose. I will see her back for follow-up visit in 1 months for evaluation with repeat CT scan of the chest for restaging of her disease. For the hyponatremia and the anemia, we will continue to monitor her closely.   has a past medical history of Arthralgia of multiple joints, Arthritis, Cardiomyopathy ([place]), Chronic constipation, Chronic inflammatory arthritis, Dry eyes, GERD (gastroesophageal reflux disease), H/O discoid lupus erythematosus, Hiatal hernia, History of colon polyps, Hypothyroidism, Iron deficiency anemia, LBBB (left bundle branch block) (2010), Mitchell's disease (erythromelalgia) ([place]), Nocturia, Non-small cell cancer of right lung [facility]) (surgeon-- dr gerhardt/  oncologist-  dr Julien Nordmann--- per lov notes no recurrence/   11-18-2017 per pt denies any symptoms), OA (osteoarthritis), Osteoporosis, PONV (postoperative nausea and vomiting), Raynaud's phenomenon (1965), Renal insufficiency, Rheumatoid arthritis ([place]), Sciatica, Scoliosis, and Sjogren's syndrome ([place]).   has a past surgical history that includes Thoracic sympathetectomy (1965); Colonoscopy; Cardiovascular stress test (12/2008); Video bronchoscopy with endobronchial navigation (N/A, 05/04/2013); Video bronchoscopy (N/A, 06/07/2013); Video assisted thoracoscopy (vats)/wedge resection (Right, 06/07/2013); Thoracotomy (06/07/2013); Lymph node dissection (Right, 06/07/2013); Total hip arthroplasty (Right, 04/28/2014); transthoracic echocardiogram (12/11/2008); Total abdominal hysterectomy (1980's ); Appendectomy (1950s); Tonsillectomy (child); Anterior hip revision (Right, 11/27/2017); Anterior hip revision (Right, 01/24/2018); Femoral-popliteal Bypass Graft (Right, 04/10/2018); Patch angioplasty (Right, 04/10/2018); Esophagogastroduodenoscopy (egd) with propofol (N/A, 04/14/2018); biopsy (04/14/2018); Colonoscopy with propofol (N/A, 04/16/2018); biopsy (04/16/2018); Flexible  sigmoidoscopy (N/A, 06/17/2019);  Schlerotherapy (06/17/2019); and Hemostasis clip placement (06/17/2019).    Significant Hospital Events:  12/23/2021 - admit 12/24/21 - pccm consul;t  - RVP neg  - Blood culture 11/6   - MRSA pcr 11/66 -neg  - Flu PCR/Cvovid PCR 11/6 - neg   Interim History / Subjective:   12/25/2021 - 800cc amber color thora 12/25/2021. Results pending. Cxr without ptx post thora. Not on pressor. Not on vent. Room air pulse ox now - 85%. 3L  pulse ox with good trace 91-95%. No distress. Daughter condcernd about tele alarms on heart rate side -> reviewed EKG - has LBBB baseline atleast since feb 2023/Jan 2021  but has new increaed QTc at admit since prior EKG 11/11/21 -> daughetr concerned is due to tagrisso   Objective   Blood pressure (!) 115/58, pulse 91, temperature 98.2 F (36.8 C), temperature source Oral, resp. rate 17, height 5' (1.524 m), weight 52.4 kg, SpO2 95 %.        Intake/Output Summary (Last 24 hours) at 12/25/2021 1333 Last data filed at 12/25/2021 0800 Gross per 24 hour  Intake 243.49 ml  Output 300 ml  Net -56.51 ml   Filed Weights   12/23/21 1622 12/24/21 1536 12/25/21 0500  Weight: 51.7 kg 51.3 kg 52.4 kg    Examination: General: No distress. LEan, Frail. Plesaant Neuro: Alert and Oriented x 3. GCS 15. Speech normal Psych: Pleasant Resp:  Barrel Chest - no.  Wheeze - no, Crackles - no, No overt respiratory distress. 3L [place] CVS: Normal heart sounds. Murmurs - no Ext: Stigmata of Connective Tissue Disease - no HEENT: Normal upper airway. PEERL +. No post nasal drip   Resolved Hospital Problem list   x  Assessment & Plan:    Background  Chronic systolic CHF  with LBBB- ef 45% Known  Moderate Hiatal Hernia Knwn NSCLC  - multiple lung nodule - on tagriss since Oct 2023  Current Present on admit  Acute hypoxemic resp failure - mild to moderate 3L [place] (PE Ruled out)  Rt pleural effusion  > L Pleural effusion  - new since 11/01/21 renaal  CT and 09/27/21 ChestCt2 = s/p 800cc amber thora 12/25/21 - results pending   12/25/2021 -> Ddx - infection v cardiac v malignancy, On CT - Rt effusion looks exudate but BNP 874 and very elevated (baseline 333 in 2020).   - Infection unlikely (Urine strep neg,  - RVP neg, - PCT < 0.1)  - acue on chronic s-CHF possible (BNP 874 which is 3x higer than baseline, trop neg)   - if so due to tagrisso (2-4% incidencence)  P:   O2 for pulse ox > 92% Change  abx = dc azithromycin (long QTc) - contninue ceftriaxone for now - but low threshold to dc 12/26/21 depending on fluid chemistry  Continue diuresis and chf mgmt per triad   - lasix 30m IV daily  - coreg   Prolonged QTc - new at admit. New since sept 2023  ? Tagrisso cause (1% incidence per lit) ? Electrolyte issue Also on azithromycin  Plan  - repeat 12 leakd EKG  - check mag and phos 12/25/2021 - pharmacy med review   - dc zofran  - change tramadol to prn  Lung cancer  Plan  - need Dr MJulien Nordmannopinion if TNewman Niphas increased her risk for Qtc and s-CHF    Best practice (daily eval):  Per triad  Daughter and patient updated 11?8/23  Patient can move to telemetry -  d/w Triad MD  SIGNATURE    Dr. Brand Males, M.D., F.C.C.P,  Pulmonary and Critical Care Medicine Staff Physician, [place] Director - Interstitial Lung Disease  Program  [facility] [place] [facility] [place], [place], [address]  NPI Number:  NPI #[id number]  Pager: [phone], If no answer  -> Check AMION or Try [phone] Telephone (clinical office): [phone] Telephone (research): [phone]  1:33 PM 12/25/2021   12/25/2021 1:33 PM    LABS    PULMONARY No results for input(s): "PHART", "PCO2ART", "PO2ART", "HCO3", "TCO2", "O2SAT" in the last 168 hours.  Invalid input(s): "PCO2", "PO2"  CBC Recent Labs  Lab 12/23/21 1458 12/24/21 0458 12/25/21 0229  HGB 10.8* 9.1*  9.5*  HCT 32.6* 27.6* 29.5*  WBC 6.3 6.7 7.9  PLT 169 148* 162    COAGULATION No results for input(s): "INR" in the last 168 hours.  CARDIAC  No results for input(s): "TROPONINI" in the last 168 hours. No results for input(s): "PROBNP" in the last 168 hours.  CHEMISTRY Recent Labs  Lab 12/19/21 1041 12/23/21 1458 12/24/21 0458 12/25/21 0229  NA 126* 121* 123* 123*  K 5.1 4.8 4.7 4.0  CL 93* 87* 92* 88*  CO2 _0 GLUCOSE 87 124* 90 88  BUN 26* _1 CREATININE 1.10* 1.03* 1.15* 1.17*  CALCIUM 9.2 9.3 8.1* 8.0*   Estimated Creatinine Clearance: 23.4 mL/min (A) (by C-G formula based on SCr of 1.17 mg/dL (H)).   LIVER Recent Labs  Lab 12/19/21 1041 12/23/21 1458  AST 29 27  ALT 13 12  ALKPHOS 31* 34*  BILITOT 0.3 0.4  PROT 7.2 7.1  ALBUMIN 4.2 4.1     INFECTIOUS Recent Labs  Lab 12/23/21 1648 12/23/21 2128  LATICACIDVEN 1.2  --   PROCALCITON  --  <0.10     ENDOCRINE CBG (last 3)  No results for input(s): "GLUCAP" in the last 72 hours.       IMAGING x48h  - image(s) personally visualized  -   highlighted in bold US THORACENTESIS ASP PLEURAL SPACE W/IMG GUIDE  Result Date: 12/25/2021 INDICATION: Patient with history of non-small cell lung cancer and prior right upper lobectomy and right middle lobe wedge resection, dyspnea, CHF, bilateral pleural effusions; request received for diagnostic and therapeutic right thoracentesis. EXAM: ULTRASOUND GUIDED DIAGNOSTIC AND THERAPEUTIC RIGHT THORACENTESIS MEDICATIONS: 8 mL 1% lidocaine COMPLICATIONS: None immediate. PROCEDURE: An ultrasound guided thoracentesis was thoroughly discussed with the patient and questions answered. The benefits, risks, alternatives and complications were also discussed. The patient understands and wishes to proceed with the procedure. Written consent was obtained. Ultrasound was performed to localize and mark an adequate pocket of fluid in the right chest. The area was then  prepped and draped in the normal sterile fashion. 1% Lidocaine was used for local anesthesia. Under ultrasound guidance a 6 Fr Safe-T-Centesis catheter was introduced. Thoracentesis was performed. The catheter was removed and a dressing applied. FINDINGS: A total of approximately 800 cc of amber fluid was removed. Samples were sent to the laboratory as requested by the clinical team. IMPRESSION: Successful ultrasound guided diagnostic and therapeutic right thoracentesis yielding 800 cc of pleural fluid. Read by: Rowe Robert, PA-C Electronically Signed   By: Ruthann Cancer M.D.   On: 12/25/2021 10:42   DG Chest 1 View  Result Date: 12/25/2021 CLINICAL DATA:  Post right thoracentesis EXAM: PORTABLE CHEST 1 VIEW COMPARISON:  12/23/2021 FINDINGS: There has been interval reduction in right-sided pleural effusion when compare with the prior exam. Some persistent scarring is noted as well as fullness in the right hilar region stable from the prior exam. Postsurgical changes are again seen. No pneumothorax is noted. Scarring in the left base is again identified. IMPRESSION: No pneumothorax following thoracentesis. Electronically Signed   By: Inez Catalina M.D.   On: 12/25/2021 10:12   ECHOCARDIOGRAM COMPLETE  Result Date: 12/24/2021    ECHOCARDIOGRAM REPORT   Patient Name:   MYSTIC LABO Date of Exam: 12/24/2021 Medical Rec #:  [id number]        Height:       60.0 in Accession #:    [id number]       Weight:       114.0 lb Date of Birth:  [birth year]        BSA:          1.470 m Patient Age:    28 years         BP:           116/70 mmHg Patient Gender: F                HR:           74 bpm. Exam Location:  Inpatient Procedure: 2D Echo, 3D Echo, Cardiac Doppler and Color Doppler Indications:    I50.40* Unspecified combined systolic (congestive) and diastolic                 (congestive) heart failure  History:        Patient has prior history of Echocardiogram examinations, most                 recent 04/17/2021. CHF,  Signs/Symptoms:Shortness of Breath,                 Dyspnea and Edema; Risk Factors:Hypertension and Dyslipidemia.                 Lung cancer.  Sonographer:    Roseanna Rainbow RDCS Referring Phys: [id number] [place]  1. Left ventricular ejection fraction, by estimation, is 40 to 45%. Left ventricular ejection fraction by 3D volume is 45 %. The left ventricle has mildly decreased function. The left ventricle demonstrates global hypokinesis. Left ventricular diastolic  parameters are consistent with Grade II diastolic dysfunction (pseudonormalization). Elevated left atrial pressure.  2. Right ventricular systolic function is normal. The right ventricular size is normal. There is normal pulmonary artery systolic pressure. The estimated right ventricular systolic pressure is 53.6 mmHg.  3. Left atrial size was moderately dilated.  4. Right atrial size was mildly dilated.  5. Large pleural effusion in the left lateral region.  6. 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 Severe mitral valve regurgitation due to central malcoaptation with flow reversal in the pulmonary veins and triangular "v-wave cutoff" spectral configuration.  7. The aortic valve is tricuspid. There is mild thickening of the aortic valve. Aortic valve regurgitation is trivial. Aortic valve sclerosis is present, with no evidence of aortic valve stenosis.  8. The inferior vena cava is normal in size with <50% respiratory variability, suggesting right atrial pressure of 8 mmHg. Comparison(s): A prior study was performed on 04/17/2021. Prior images reviewed side by side. The left ventricular function is unchanged. There is marked worsening of the mitral insufficiency. The leaflets are restricted and there is central malcoaptation. LBBB is present but there does not appear to  be severe LV septal-lateral dyssynchrony. FINDINGS  Left Ventricle: Left ventricular ejection fraction, by estimation, is 40 to 45%. Left ventricular  ejection fraction by 3D volume is 45 %. The left ventricle has mildly decreased function. The left ventricle demonstrates global hypokinesis. The left ventricular internal cavity size was normal in size. There is no left ventricular hypertrophy. Abnormal (paradoxical) septal motion, consistent with left bundle branch block. Left ventricular diastolic parameters are consistent with Grade II diastolic dysfunction (pseudonormalization). Elevated left atrial pressure. Right Ventricle: The right ventricular size is normal. No increase in right ventricular wall thickness. Right ventricular systolic function is normal. There is normal pulmonary artery systolic pressure. The tricuspid regurgitant velocity is 2.45 m/s, and  with an assumed right atrial pressure of 8 mmHg, the estimated right ventricular systolic pressure is 00.1 mmHg. Left Atrium: Left atrial size was moderately dilated. Right Atrium: Right atrial size was mildly dilated. Pericardium: There is no evidence of pericardial effusion. Mitral Valve: The mitral valve is rheumatic. There is mild thickening of the anterior and posterior mitral valve leaflet(s). Severe mitral valve regurgitation, with centrally-directed jet. MV peak gradient, 6.4 mmHg. The mean mitral valve gradient is 3.0  mmHg. Tricuspid Valve: The tricuspid valve is normal in structure. Tricuspid valve regurgitation is trivial. Aortic Valve: The aortic valve is tricuspid. There is mild thickening of the aortic valve. Aortic valve regurgitation is trivial. Aortic regurgitation PHT measures 459 msec. Aortic valve sclerosis is present, with no evidence of aortic valve stenosis. Pulmonic Valve: The pulmonic valve was grossly normal. Pulmonic valve regurgitation is not visualized. Aorta: The aortic root and ascending aorta are structurally normal, with no evidence of dilitation. Venous: A pattern of systolic flow reversal, suggestive of severe mitral regurgitation is recorded from the right upper  pulmonary vein. The inferior vena cava is normal in size with less than 50% respiratory variability, suggesting right atrial pressure of 8 mmHg. IAS/Shunts: No atrial level shunt detected by color flow Doppler. Additional Comments: There is a large pleural effusion in the left lateral region.  LEFT VENTRICLE PLAX 2D LVIDd:         4.80 cm         Diastology LVIDs:         3.40 cm         LV e' medial:    5.87 cm/s LV PW:         0.80 cm         LV E/e' medial:  18.6 LV IVS:        0.80 cm         LV e' lateral:   10.40 cm/s LVOT diam:     2.10 cm         LV E/e' lateral: 10.5 LV SV:         61 LV SV Index:   41 LVOT Area:     3.46 cm        3D Volume EF                                LV 3D EF:    Left                                             ventricul LV Volumes (MOD)  ar LV vol d, MOD    128.0 ml                   ejection A2C:                                        fraction LV vol d, MOD    100.3 ml                   by 3D A4C:                                        volume is LV vol s, MOD    64.7 ml                    45 %. A2C: LV vol s, MOD    64.8 ml A4C:                           3D Volume EF: LV SV MOD A2C:   63.3 ml       3D EF:        45 % LV SV MOD A4C:   100.3 ml      LV EDV:       134 ml LV SV MOD BP:    52.6 ml       LV ESV:       74 ml                                LV SV:        60 ml RIGHT VENTRICLE             IVC RV S prime:     12.60 cm/s  IVC diam: 1.60 cm TAPSE (M-mode): 2.0 cm LEFT ATRIUM             Index        RIGHT ATRIUM           Index LA diam:        4.40 cm 2.99 cm/m   RA Area:     12.50 cm LA Vol (A2C):   43.7 ml 29.73 ml/m  RA Volume:   26.30 ml  17.89 ml/m LA Vol (A4C):   54.4 ml 37.01 ml/m LA Biplane Vol: 53.0 ml 36.06 ml/m  AORTIC VALVE LVOT Vmax:   88.60 cm/s LVOT Vmean:  59.600 cm/s LVOT VTI:    0.175 m AI PHT:      459 msec  AORTA Ao Root diam: 3.20 cm Ao Asc diam:  3.00 cm MITRAL VALVE                  TRICUSPID VALVE MV Area (PHT): 4.30 cm        TR Peak grad:   24.0 mmHg MV Area VTI:   1.54 cm       TR Vmax:        245.00 cm/s MV Peak grad:  6.4 mmHg MV Mean grad:  3.0 mmHg       SHUNTS MV Vmax:       1.26 m/s       Systemic VTI:  0.18 m MV  Vmean:      81.5 cm/s      Systemic Diam: 2.10 cm MV Decel Time: 176 msec MR Peak grad:    62.1 mmHg MR Mean grad:    30.5 mmHg MR Vmax:         394.00 cm/s MR Vmean:        242.5 cm/s MR PISA:         4.02 cm MR PISA Eff ROA: 39 mm MR PISA Radius:  0.80 cm MV E velocity: 109.00 cm/s MV A velocity: 65.47 cm/s MV E/A ratio:  1.66 Mihai Croitoru MD Electronically signed by Sanda Klein MD Signature Date/Time: 12/24/2021/11:04:46 AM    Final    CT Angio Chest PE W/Cm &/Or Wo Cm  Result Date: 12/23/2021 CLINICAL DATA:  Right upper lobe non-small cell lung cancer, status post right upper lobectomy and right middle lobe wedge resection. Shortness of breath for 1 week.  Dyspnea at rest. * Tracking Code: BO * EXAM: CT ANGIOGRAPHY CHEST WITH CONTRAST TECHNIQUE: Multidetector CT imaging of the chest was performed using the standard protocol during bolus administration of intravenous contrast. Multiplanar CT image reconstructions and MIPs were obtained to evaluate the vascular anatomy. RADIATION DOSE REDUCTION: This exam was performed according to the departmental dose-optimization program which includes automated exposure control, adjustment of the mA and/or kV according to patient size and/or use of iterative reconstruction technique. CONTRAST:  53m OMNIPAQUE IOHEXOL 350 MG/ML SOLN COMPARISON:  09/27/2021 FINDINGS: Cardiovascular: No filling defect is identified in the pulmonary arterial tree to suggest pulmonary embolus. Aortic atherosclerosis.  Mild cardiomegaly. Mediastinum/Nodes: Partial obscuration of mediastinal structures due to the very dense contrast medium in the SVC and brachiocephalic vein. No overt pathologic adenopathy identified. Moderate-sized hiatal hernia. Lungs/Pleura: Moderate right pleural  effusion, cannot exclude exudative/complex component. Small left pleural effusion. Postoperative findings in the right lung. Confluent ground-glass density opacities scattered in the right lower lobe, obscuring the previous ground-glass density nodules to some degree. Substantial passive atelectasis in the right lung. Emphysema noted. Patchy ground-glass densities in the left upper lobe corresponding to secondary pulmonary lobules with associated septal thickening in the apex compatible with interstitial edema and mild airspace edema. Passive atelectasis in the left lower lobe. Reduce conspicuity of some of the previous pulmonary nodules although some of this reduced conspicuity may be due to surrounding ground-glass opacities and also from motion artifact. I suspect that many of these pulmonary nodules are still present. Upper Abdomen: Unremarkable Musculoskeletal: Dextroconvex lumbar scoliosis. Severe degenerative glenohumeral arthropathy bilaterally. No compelling findings of osseous metastatic disease. Review of the MIP images confirms the above findings. IMPRESSION: 1. No filling defect is identified in the pulmonary arterial tree to suggest pulmonary embolus. 2. New moderate right pleural effusion, cannot exclude exudative/complex component. New small left pleural effusion. 3. Interstitial edema and scattered ground-glass density opacities which along with motion artifact is thought to be obscuring some of the previous pulmonary nodules. 4. Mild cardiomegaly. 5. Moderate-sized hiatal hernia. 6. Severe degenerative glenohumeral arthropathy bilaterally. 7. Aortic atherosclerosis. Aortic Atherosclerosis (ICD10-I70.0). Electronically Signed   By: WVan ClinesM.D.   On: 12/23/2021 18:14   DG Chest 2 View  Result Date: 12/23/2021 CLINICAL DATA:  Cough, lung cancer. EXAM: CHEST - 2 VIEW COMPARISON:  July 29, 2014.  September 30, 2021. FINDINGS: Stable cardiomediastinal silhouette. Right basilar opacity is  noted concerning for pneumonia or atelectasis and associated pleural effusion. Underlying malignancy cannot be excluded. Mild left basilar atelectasis or scarring is noted. Bony thorax  is unremarkable. IMPRESSION: Right basilar opacity is noted concerning for pneumonia or atelectasis and associated pleural effusion. Underlying malignancy cannot be excluded. Electronically Signed   By: Marijo Conception M.D.   On: 12/23/2021 14:42

## 2021-12-25 NOTE — Evaluation (Signed)
Occupational Therapy Evaluation Patient Details Name: [name] MRN: [id number] DOB: 12/27/1930 Today's Date: 12/25/2021   History of Present Illness [name] is a 88 y.o. female with medical history significant of multifocal non-small cell lung cancer, dilated cardiomyopathy, combined systolic and diastolic HF, HTN, seronegative RA,osteoporosis,sicca syndrome, raynaud'sdisease, right hip surgeries, anemia of chronic disease who presents 12/23/21  with hypoxia and shortness of breath.CXR showing right basilar opacity concerning for pneumonia vs atelectasis and pleural effusion. Also noted to have acute on chronic LE edema   Clinical Impression   Mrs. [name] is an 88 year old woman who  presents with above medical history. On evaluation she presents on 3 L [place] and ICU monitoring. She is min assist for supine to sit and ambulation with walker to bathroom. She was supervision for toileting. She needed assistance to don gripper socks - and reports a lot of difficulty at baseline. She appears to be close to her baseline in regards to ADLS except for her current need for oxygen which maintained in high 90s on 3 L [place]. Patient has no acute OT needs. Will defer to PT for ambulation and oxygen management.      Recommendations for follow up therapy are one component of a multi-disciplinary discharge planning process, led by the attending physician.  Recommendations may be updated based on patient status, additional functional criteria and insurance authorization.   Follow Up Recommendations  No OT follow up    Assistance Recommended at Discharge Intermittent Supervision/Assistance  Patient can return home with the following Assistance with cooking/housework;Help with stairs or ramp for entrance    Functional Status Assessment  Patient has not had a recent decline in their functional status  Equipment Recommendations  None recommended by OT    Recommendations for Other Services        Precautions / Restrictions Precautions Precautions: Fall Precaution Comments: monitor sats/HR, on 3 LPM Restrictions Weight Bearing Restrictions: No      Mobility Bed Mobility Overal bed mobility: Needs Assistance Bed Mobility: Supine to Sit     Supine to sit: Min assist     General bed mobility comments: assist for lines,    Transfers Overall transfer level: Needs assistance Equipment used: Rolling walker (2 wheels) Transfers: Sit to/from Stand Sit to Stand: Min assist           General transfer comment: slight posterior bias      Balance Overall balance assessment: Mild deficits observed, not formally tested                                         ADL either performed or assessed with clinical judgement   ADL Overall ADL's : At baseline                                       General ADL Comments: Reports significant difficulty with socks at baseline. Reports she knows about sock aide.     Vision Baseline Vision/History: 1 Wears glasses Ability to See in Adequate Light: 1 Impaired       Perception     Praxis      Pertinent Vitals/Pain Pain Assessment Pain Assessment: No/denies pain     Hand Dominance Right   Extremity/Trunk Assessment Upper Extremity Assessment Upper Extremity Assessment: RUE deficits/detail;LUE deficits/detail  RUE Deficits / Details: poor shoulder ROM at baseline otherwise functional strength and ROM RUE Coordination: decreased fine motor (arthritic changes in fingers) LUE Deficits / Details: poor shoulder ROM at baseline otherwise functional strength and ROM LUE Coordination: decreased fine motor (arthritic changes in hand)   Lower Extremity Assessment Lower Extremity Assessment: Defer to PT evaluation   Cervical / Trunk Assessment Cervical / Trunk Assessment: Normal (scoliotic)   Communication Communication Communication: No difficulties   Cognition Arousal/Alertness:  Awake/alert Behavior During Therapy: WFL for tasks assessed/performed Overall Cognitive Status: Within Functional Limits for tasks assessed                                       General Comments       Exercises     Shoulder Instructions      Home Living Family/patient expects to be discharged to:: Private residence Living Arrangements: Alone Available Help at Discharge: Family;Personal care attendant;Available PRN/intermittently Type of Home: House Home Access: Stairs to enter [employer] of Steps: 3 back steps, 2 steps in front with no rails Entrance Stairs-Rails: Right;Left Home Layout: Two level     Bathroom Shower/Tub: Occupational psychologist: Handicapped height Bathroom Accessibility: Yes   Home Equipment: Rollator (4 wheels);Rolling Walker (2 wheels);Shower seat - built in;Cane - single point;Grab bars - tub/shower;Grab bars - toilet;Hand held shower head   Additional Comments: caregiver bathes her on saturday      Prior Functioning/Environment Prior Level of Function : Independent/Modified Independent             Mobility Comments: uses rollator, has not driven for several weeks ADLs Comments: independent except for showering        OT Problem List: Cardiopulmonary status limiting activity      OT Treatment/Interventions:      OT Goals(Current goals can be found in the care plan section) Acute Rehab OT Goals OT Goal Formulation: All assessment and education complete, DC therapy  OT Frequency:      Co-evaluation PT/OT/SLP Co-Evaluation/Treatment: Yes (coeval) Reason for Co-Treatment: For Doctor, hospital;To address functional/ADL transfers PT goals addressed during session: Mobility/safety with mobility OT goals addressed during session: ADL's and self-care      AM-PAC OT "6 Clicks" Daily Activity     Outcome Measure Help from another person eating meals?: None Help from another person taking care of  personal grooming?: None Help from another person toileting, which includes using toliet, bedpan, or urinal?: A Little Help from another person bathing (including washing, rinsing, drying)?: A Little Help from another person to put on and taking off regular upper body clothing?: A Little Help from another person to put on and taking off regular lower body clothing?: A Little 6 Click Score: 20   End of Session Equipment Utilized During Treatment: Rolling walker (2 wheels);Gait belt;Oxygen Nurse Communication: Mobility status  Activity Tolerance: Patient tolerated treatment well Patient left: in chair;with call bell/phone within reach;with chair alarm set;with family/visitor present  OT Visit Diagnosis: Muscle weakness (generalized) (M62.81)                Time: 1610-9604 OT Time Calculation (min): 27 min Charges:  OT General Charges $OT Visit: 1 Visit OT Evaluation $OT Eval Low Complexity: 1 Low  Gustavo Lah, OTR/L [place]  Office [phone]   Lenward Chancellor 12/25/2021, 4:32 PM

## 2021-12-25 NOTE — TOC Initial Note (Signed)
Transition of Care [facility]) - Initial/Assessment Note    Patient Details  Name: [name] MRN: [id number] Date of Birth: 11/19/1930  [facility]) CM/SW Contact:    Dessa Phi, RN Phone Number: 12/25/2021, 10:06 AM  Clinical Narrative: Await PT recc.                  Expected Discharge Plan: [place] Barriers to Discharge: Continued Medical Work up   Patient Goals and CMS Choice        Expected Discharge Plan and Services Expected Discharge Plan: [place]                                              Prior Living Arrangements/Services                       Activities of Daily Living Home Assistive Devices/Equipment: Eyeglasses, Dentures (specify type) (reading glasses, full set dentures) ADL Screening (condition at time of admission) Patient's cognitive ability adequate to safely complete daily activities?: Yes Is the patient deaf or have difficulty hearing?: No Does the patient have difficulty seeing, even when wearing glasses/contacts?: No Does the patient have difficulty concentrating, remembering, or making decisions?: No Patient able to express need for assistance with ADLs?: Yes Does the patient have difficulty dressing or bathing?: Yes Independently performs ADLs?: No Communication: Independent Dressing (OT): Needs assistance Is this a change from baseline?: Change from baseline, expected to last >3 days Grooming: Independent Feeding: Independent Bathing: Needs assistance Is this a change from baseline?: Change from baseline, expected to last >3 days Toileting: Needs assistance Is this a change from baseline?: Change from baseline, expected to last >3days In/Out Bed: Needs assistance Is this a change from baseline?: Change from baseline, expected to last >3 days Walks in Home: Needs assistance Is this a change from baseline?: Change from baseline, expected to last >3 days Does the  patient have difficulty walking or climbing stairs?: Yes Weakness of Legs: Both  Permission Sought/Granted                  Emotional Assessment              Admission diagnosis:  Acute respiratory failure ([place]) [J96.00] Hyponatremia [E87.1] Respiratory distress [R06.03] Acute respiratory failure with hypoxia ([place]) [J96.01] Acute congestive heart failure, unspecified heart failure type ([place]) [I50.9] Patient Active Problem List   Diagnosis Date Noted   Acute respiratory failure ([place]) 12/24/2021   Acute respiratory failure with hypoxia ([place]) 12/23/2021   Acute on chronic combined systolic and diastolic CHF (congestive heart failure) ([place]) 12/23/2021   Rectal pain 10/10/2020   Acute posthemorrhagic anemia    Rectal bleeding 06/17/2019   Grade II internal hemorrhoids    Rectal ulcer    Popliteal artery occlusion, 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/10/2018   HTN (hypertension) 04/10/2018   Femoral artery pseudo-aneurysm, right ([place]) 04/09/2018   Anemia of chronic disease 02/24/2018   Unstable right hip arthroplasty 01/24/2018   History of revision of total replacement of right hip joint 01/24/2018   Hypovolemic shock ([place])    Hyperkalemia    Hyponatremia    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   Status post revision of total hip 11/27/2017   Elevated cholesterol 10/11/2015   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[place]) 10/04/2014   Bilateral leg  edema 08/01/2014   Elevated BP 08/01/2014   Rheumatoid arthritis involving multiple joints ([place]) 05/30/2014   Status post total replacement of right hip 04/28/2014   Long-term use of high-risk medication 11/11/2013   Symptomatic anemia 11/11/2013   Neuropathic pain 07/07/2013   Constipation due to pain medication 07/07/2013   Protein-calorie malnutrition, severe ([place]) 06/08/2013   Lung cancer, Right upper lobe 05/08/2013   Sciatica of right side 08/13/2011   Osteoporosis 03/07/2010   PARESTHESIA 03/07/2010   CT, CHEST, ABNORMAL 12/18/2008    ABNORMAL ECHOCARDIOGRAM 12/14/2008   SJOGREN'S SYNDROME 11/29/2008   HYPOGLYCEMIA 06/29/2006   RAYNAUD'S DISEASE 06/29/2006   PCP:  Darreld Mclean, MD Pharmacy:   [id number] - [place], [address] [place] [address] Phone: [phone] Fax: [place] [address] Phone: [phone] Fax: [phone]     Social Determinants of Health (SDOH) Interventions    Readmission Risk Interventions     No data to display

## 2021-12-25 NOTE — Progress Notes (Signed)
Consultation Progress Note   Patient: [name] YOV:[id number] DOB: 01/18/1931 DOA: 12/23/2021 DOS: the patient was seen and examined on 12/25/2021 Primary service: Martino Tompson, Manfred Shirts, MD  Brief hospital course: [name] is a 88 y.o. female with medical history significant of multifocal non-small cell lung cancer, dilated cardiomyopathy, combined systolic and diastolic HF, HTN, seronegative RA,osteoporosis,sicca syndrome, raynaud'sdisease, anemia of chronic disease who presents with hypoxia and shortness of breath.    She has noticed increase shortness of breath with mild cough for the past 3 days. No fever. Trace edema and has not taken Lasix in the past 3 days. Had nausea but no vomiting. Occasional diarrhea due to her chemotherapy. Has good appetite. Pt is independent and lives by herself with daughter nearby.   Pt had follow up with oncology today and was noted to be hypoxic to 84% on RA with wheezing and mild respiratory distress. CXR showing right basilar opacity concerning for pneumonia vs atelectasis and pleural effusion. Also noted to have acute on chronic LE edema with weight. She was advised to present to ED.    In the ED, she was afebrile, normotensive and ultimately required Bipap.  WBC of 6.3, hemoglobin of 10.8.  Lactate within normal limits at 1.2. Sodium of 121, K of 4.8, chloride of 87, creatinine 1.03 BNP of 874   Negative flu/COVID PCR   CTA chest shows no pulmonary embolism but there is new moderate right pleural effusion, new small left pleural effusion.  Interstitial edema and scattered groundglass density opacities.   She was given IV Rocephin, azithromycin and 20 mg of IV Lasix in the ED.  Hospitalist then consulted for admission.  Assessment and Plan: Principal Problem:   Acute respiratory failure with hypoxia (HCC) Active Problems:   Lung cancer, Right upper lobe   Rheumatoid arthritis involving multiple joints (HCC)   Hyponatremia   Anemia of  chronic disease   HTN (hypertension)   Acute on chronic combined systolic and diastolic CHF (congestive heart failure) (HCC)   Acute respiratory failure (HCC)      Acute respiratory failure with hypoxia (HCC) Secondary to bilateral pleural effusion from acute on chronic combined CHF and possible pneumonia 11/8-Status  post thoracentesis yielding 800 cc of amber fluid, she is currently on nasal cannula.  Patient and daughter state that her breathing has significantly improved. Remains on diuresis and antibiotics. -BiPAP as needed -DuoNeb bronchodilators   Bilateral pleural effusion -Status postthoracentesis, follow labs for further analysis cytology, culture    Acute on chronic combined systolic and diastolic CHF (congestive heart failure) (HCC) Last echocardiogram on 04/2021 with EF of 40 to 45% with grade 1 diastolic dysfunction. -Pete 2D echo reviewed: Left LV EJF 40 to 45%.mildly decreased function. The left ventricle demonstrates global  hypokinesis. Left ventricular diastolic  parameters are consistent with Grade II diastolic dysfunction Mitral regurgitation -Cardiology plans to diurese to obtain euvolemic state and consider limited echo to assess the MR.    -IV Lasix 40mg  daily -strict intake and output -daily wts -Cardiology team consulted, appreciate input   HTN (hypertension) Stable. On IV Lasix.    Anemia of chronic disease Stable Hgb at baseline of 10.8    Hyponatremia 127 (10/17)- 126 (11/2)- 121-123 on presentation -pt is fluid overload but also has diarrhea frequently due to chemo. Check serum osm, urine osm and urine sodium -monitor while on IV diuretic   Acute on chronic hyponatremia  -Serum sodium 121, 123 -Adding supplement sodium p.o. 1 g daily We  will monitor closely likely exacerbated by lung CA    rheumatoid arthritis involving multiple joints ([place]) On IV Orencia followed by rheumatology--I am to hold for now   Nonsmall cell lung cancer,  adenocarcinoma, stage 2a  -s/p right upper lobectomy and right middle lobe wedge resection in 2015  multifocal non-small cell lung cancer -follows with Dr. Julien Nordmann  -On Tagrisso although concerned about cardiotoxicity with current CHF exacerbation Discussed with Dr. Earlie Server okay to withhold for now         [facility] will continue to follow the patient.  Subjective: Seen at bedside this morning, she reports improved breathing.  She just had her thoracentesis done.  She feels well.  Physical Exam:  GEN: Well nourished, well developed in no acute distress NECK: Supple CARDIAC: regular rhythm, normal S1 and S2, no rubs or gallops. 8-8/7 holosystolic murmur, at apex VASCULAR: Radial pulses 2+ bilaterally.  RESPIRATORY:  Clear in upper fields, diminished breath sounds bilaterally  ABDOMEN: Soft, non-tender, non-distended MUSCULOSKELETAL:  Moves all 4 limbs independently SKIN: Warm and dry, no edema NEUROLOGIC:  No focal neuro deficits noted. PSYCHIATRIC:  Normal affect  Vitals:   12/25/21 0730 12/25/21 0753 12/25/21 0800 12/25/21 0810  BP:   (!) 145/98   Pulse: 89 100 (!) 101   Resp: (!) 22 (!) 23 (!) 26   Temp:      TempSrc:      SpO2: 95% 90% 91% 92%  Weight:      Height:        Data Reviewed:  There are no new results to review at this time.  Family Communication: Daughter at bedside  Time spent: 15 minutes.  Author: Cristela Felt, MD 12/25/2021 11:32 AM  For on call review [email or web address].

## 2021-12-25 NOTE — Consult Note (Signed)
Cardiology Consultation   Patient ID: [name] MRN: [id number]; DOB: [birth year]  Admit date: 12/23/2021 Date of Consult: 12/25/2021  PCP:  Darreld Mclean, MD   [place] [facility] Cardiologist:  Kirk Ruths, MD   {  Patient Profile:   [name] is a 88 y.o. female with a history of dilated cardiomyopathy/ chronic combined CHF with EF of 40-45%, palpitations with rare PVCs and brief episode of SVT noted on monitor in 2021, LBBB, chronic inflammatory arthritis followed by Rheumatology, chronic anemia, and non-small cell lung cancer who is being seen 12/25/2021 for the evaluation of acute on chronic CHF at the request of Dr. Roger Shelter.  History of Present Illness:   [name] is a 88 year female with the above history who is followed by Dr. Stanford Breed. She has a history of dilated cardiomyopathy and chronic combined CHF. Remote Echo in 2010 showed LVEF of 45-50% with grade 1 diastolic dysfunction. Nuclear stress test in 2010 showed a fixed septal defect related to LBBB but no ischemia. Echo in 03/2018 showed improved EF to 60-65% and grade 1 diastolic dysfunction. However, last Echo in 04/2021 showed LVEF of 40-45% with no regional wall motion abnormalities and grade 1 diastolic dysfunction as well as mild MR and mild AI. Patient was last seen by Dr. Stanford Breed on 12/04/2021 at which time she reported occasional dyspnea but was otherwise doing well from a cardiac standpoint. Her cardiomyopathy was felt to possible be due to dyssynchrony from LBBB and conservative management was recommended given patient's age and lung change.  She has a history of lung cancer initially diagnosed in 04/2013 and underwent right upper lobectomy with wedge resection of the right middle lobe. She had been doing well and was on observation for several years until recent CT scan inn 09/2021 showed interval growth of numerous solid and subsolid pulmonary nodule throughout both lung most  compatible with progressive metastatic disease. She is followed by Dr. Julien Nordmann with Oncology and was started on Tagrisso on 11/19/2021.  Patient presented to the ED on 12/23/2021 for further evaluation of acute hypoxic respiratory failure after being found to be hypoxic with O2 sat of 84% in Oncology office. EKG showed normal sinus rhythm, rate 95 bpm, with known LBBB. BNP was elevated at 874. Chest x-ray showed right basilar opacity concerning for pneumonia or atelectasis and associated pleural effusion. Chest CTA was negative for PE but did show a new moderate right pleural effusion and a small left pleural effusion as well as interstitial edema and scattered ground glass density opacities. WBC 6.3, Hgb 10.8, Plts 169. Na 121, K 4.8, Glucose 124, Cr 1.03. Alk Phos slightly low at 34 but otherwise LFTs normal. Lactic acid negative. Respiratory panel negative for COVID and influenza A/B. She was started on antibiotics and given IV Lasix in the ED and then admitted. Pulmonology has been consulted and felt right pleural effusion looks exudative on CT but there is also a concern for CHF given elevated BNP. Repeat Echo on 12/24/2021 showed unchanged EF of 40-45% with global hypokinesis and grade 2 diastolic dysfunction as well as marked worsening of his MR now considered severe. Cardiology consulted for further evaluation.   At the time of this evaluation, patient is resting comfortably on 4L of O2 via nasal cannula in no acute distress. She is about to go down for a thoracentesis. She states she started having worsening shortness of breath on Friday 12/20/2021 that go progressive worse of the weekend. She was  having shortness of breath both at rest and with exertion but it was much worse with exertion. She usually does not have any shortness of breath at rest. She denied any orthopnea but did states she had been waking up some feeling short of breath. She has chronic mild lower extremity swelling which has been stable.  She denied any chest pain. She has brief episode of palpitations which are not new for her. Prior monitor in 2021 showed rare PVCs and SVT. She denied any long episodes of SVT. She also reports some dizziness with walking lately which is a newer thing but no falls or syncope. She denies any fevers, cough, or nasal congestion. She alternates between diarrhea and constipation with her chemotherapy but no nausea or vomiting. She states she has hemorrhoid so will actually have some bleeding on the toilet paper from this but no other abnormal bleeding in urine or stools.  Past Medical History:  Diagnosis Date   Arthralgia of multiple joints    followed by dr Gerilyn Nestle   Arthritis    Cardiomyopathy ([place])    Chronic constipation    Chronic inflammatory arthritis    rhemotolgist-  dr a. Gerilyn Nestle ([facility])   Dry eyes    eye drops used    GERD (gastroesophageal reflux disease)    H/O discoid lupus erythematosus    Hiatal hernia    History of colon polyps    Hypothyroidism    Iron deficiency anemia    LBBB (left bundle branch block) 2010   Mitchell's disease (erythromelalgia) [facility])    neurologist-  dr patel   Nocturia    Non-small cell cancer of right lung [facility]) surgeon-- dr gerhardt/  oncologist-  dr Julien Nordmann--- per lov notes no recurrence/   11-18-2017 per pt denies any symptoms   dx 2015--  Stage IIA (T2b,N0,M0) , +EGFR  mutation in exon 21, non-small cell adenocarcinoma right upper lobe---  s/p  Right upper lobectomy , right middley wedge resection and node dissection---  no chemo or radiation therapy   OA (osteoarthritis)    hands   Osteoporosis    PONV (postoperative nausea and vomiting)    likes phenergan   Raynaud's phenomenon 1965   Renal insufficiency    Rheumatoid arthritis ([place])    Sciatica    Scoliosis    Sjogren's syndrome [facility])     Past Surgical History:  Procedure Laterality Date   ANTERIOR HIP REVISION Right 11/27/2017   Procedure: RIGHT HIP ACETABULAR  REVISION;  Surgeon: [name], MD;  Location: [facility];  Service: Orthopedics;  Laterality: Right;   ANTERIOR HIP REVISION Right 01/24/2018   Procedure: OPEN REDUCTION OF DISLOCATED ANTERIOR HIP WITH REVISION OF LINER AND HIP BALL;  Surgeon: [name], MD;  Location: [facility];  Service: Orthopedics;  Laterality: Right;   APPENDECTOMY  1950s   BIOPSY  04/14/2018   Procedure: BIOPSY;  Surgeon: Yetta Flock, MD;  Location: [place];  Service: Gastroenterology;;   BIOPSY  04/16/2018   Procedure: BIOPSY;  Surgeon: Irving Copas., MD;  Location: [place];  Service: Gastroenterology;;   CARDIOVASCULAR STRESS TEST  12/2008    mild fixed basal to mid septal perfusion defect felt likely due to artifact from LBBB, no ischemia, EF 58%   COLONOSCOPY     COLONOSCOPY WITH PROPOFOL N/A 04/16/2018   Procedure: COLONOSCOPY WITH PROPOFOL;  Surgeon: Irving Copas., MD;  Location: [place];  Service: Gastroenterology;  Laterality: N/A;   ESOPHAGOGASTRODUODENOSCOPY (EGD) WITH  PROPOFOL N/A 04/14/2018   Procedure: ESOPHAGOGASTRODUODENOSCOPY (EGD) WITH PROPOFOL;  Surgeon: Yetta Flock, MD;  Location: [place];  Service: Gastroenterology;  Laterality: N/A;   FEMORAL-POPLITEAL BYPASS GRAFT Right 04/10/2018   Procedure: REPAIR RIGHT FEMORAL ARTERY PSEUDOANEURYSM, RETROPERITONEAL EXPOSURE OF ILIAC ARTERY, RIGHT POPLITEAL EMBOLECTOMY;  Surgeon: Angelia Mould, MD;  Location: [place];  Service: Vascular;  Laterality: Right;   FLEXIBLE SIGMOIDOSCOPY N/A 06/17/2019   Procedure: FLEXIBLE SIGMOIDOSCOPY;  Surgeon: Lavena Bullion, DO;  [facility];  Service: Gastroenterology;  Laterality: N/A;   HEMOSTASIS CLIP PLACEMENT  06/17/2019   Procedure: HEMOSTASIS CLIP PLACEMENT;  Surgeon: Lavena Bullion, DO;  [facility];  Service: Gastroenterology;;   LYMPH NODE DISSECTION Right 06/07/2013   Procedure: LYMPH NODE DISSECTION;  Surgeon:  [name], MD;  Location: [place];  Service: Thoracic;  Laterality: Right;   PATCH ANGIOPLASTY Right 04/10/2018   Procedure: PATCH  ANGIOPLASTY OF RIGHT FEMORAL ARTERY USING BOVINE PATCH, PATCH ANGIOPLASTY OF RIGHT POPLITEAL ARTERY USING BOVINE PATCH;  Surgeon: Angelia Mould, MD;  Location: [place];  Service: Vascular;  Laterality: Right;   SCHLEROTHERAPY  06/17/2019   Procedure: [place] OF VARICES;  Surgeon: Lavena Bullion, DO;  [facility];  Service: Gastroenterology;;   [place]   "large incision from chest to up to shoulder, the nerves were tied together, for raynaud's   THORACOTOMY  06/07/2013   Procedure: MINI/LIMITED THORACOTOMY; right middle lobe wedge resection;  Surgeon: [name], MD;  Location: [place];  Service: Thoracic;;   TONSILLECTOMY  child   TOTAL ABDOMINAL HYSTERECTOMY  1980's    W/ BSO   TOTAL HIP ARTHROPLASTY Right 04/28/2014   Procedure: RIGHT TOTAL HIP ARTHROPLASTY ANTERIOR APPROACH;  Surgeon: [name], MD;  [facility];  Service: Orthopedics;  Laterality: Right;   TRANSTHORACIC ECHOCARDIOGRAM  12/11/2008   ef 40-98%, grade 1 diastolic dysfunction/  mild LAE/  mild AR and MR/  trivial TR   VIDEO ASSISTED THORACOSCOPY (VATS)/WEDGE RESECTION Right 06/07/2013   Procedure: VIDEO ASSISTED THORACOSCOPY (VATS)/right upper lobectomy, On Q;  Surgeon: [name], MD;  Location: [place];  Service: Thoracic;  Laterality: Right;   VIDEO BRONCHOSCOPY N/A 06/07/2013   Procedure: VIDEO BRONCHOSCOPY;  Surgeon: [name], MD;  Location: [facility];  Service: Thoracic;  Laterality: N/A;   VIDEO BRONCHOSCOPY WITH ENDOBRONCHIAL NAVIGATION N/A 05/04/2013   Procedure: VIDEO BRONCHOSCOPY WITH ENDOBRONCHIAL NAVIGATION;  Surgeon: [name], MD;  Location: [place];  Service: Thoracic;  Laterality: N/A;     Home Medications:  Prior to Admission medications   Medication Sig Start Date End Date Taking?  Authorizing Provider  Abatacept (ORENCIA IV) Inject into the vein every 28 (twenty-eight) days.   Yes [provider]  Acetaminophen (TYLENOL EXTRA STRENGTH PO) Take 1-2 tablets by mouth every 6 (six) hours as needed (pain).    Yes [provider]  Biotin 1000 MCG tablet Take 1,000 mcg by mouth daily.   Yes [provider]  Calcium Carbonate-Vitamin D 500-125 MG-UNIT TABS Take 1 tablet by mouth daily.   Yes [provider]  carvedilol (COREG) 3.125 MG tablet Take 1 tablet (3.125 mg total) by mouth 2 (two) times daily with a meal. 12/04/21  Yes Crenshaw, Denice Bors, MD  denosumab (PROLIA) 60 MG/ML SOSY injection Inject 60 mg into the skin every 6 (six) months.   Yes [provider]  docusate sodium (COLACE) 100 MG capsule Take 100 mg  by mouth 2 (two) times daily.   Yes [provider]  furosemide (LASIX) 20 MG tablet Take 1 tablet (20 mg total) by mouth every other day. Patient taking differently: Take 20 mg by mouth as needed for fluid or edema. 09/11/21  Yes Lelon Perla, MD  gabapentin (NEURONTIN) 300 MG capsule TAKE 1 CAPSULE BY MOUTH IN THE MORNING, 1 IN THE AFTERNOON, AND 2 AT BEDTIME Patient taking differently: See admin instructions. Take one capsule (363m) by mouth in the morning, one capsule (3083m by mouth in the afternoon, and two capsules (600 mg) by mouth at bedtime 07/08/21  Yes Copland, JeGay FillerMD  Glucosamine-Chondroit-Vit C-Mn (GLUCOSAMINE CHONDR 1500 COMPLX PO) Take 1 capsule by mouth daily.   Yes [provider]  HYDROcodone-acetaminophen (NORCO/VICODIN) 5-325 MG tablet Take 0.5-1 tablets by mouth every 8 (eight) hours as needed. 08/26/21  Yes Copland, JeGay FillerMD  levothyroxine (SYNTHROID) 75 MCG tablet Take 1 tablet (75 mcg total) by mouth daily before breakfast. 11/06/21  Yes Copland, JeGay FillerMD  methocarbamol (ROBAXIN) 500 MG tablet TAKE 1 TABLET BY MOUTH EVERY 6 HOURS AS NEEDED FOR MUSCLE SPASMS. Patient  taking differently: Take 500 mg by mouth every 6 (six) hours as needed for muscle spasms. 11/08/21  Yes BlMcarthur RossettiMD  Multiple Vitamin (MULTIVITAMIN) tablet Take 1 tablet by mouth daily.   Yes [provider]  omeprazole (PRILOSEC) 20 MG capsule TAKE 1 CAPSULE BY MOUTH EVERY DAY Patient taking differently: Take 20 mg by mouth daily. 09/24/21  Yes Copland, JeGay FillerMD  osimertinib mesylate (TAGRISSO) 80 MG tablet Take 1 tablet (80 mg total) by mouth daily. 11/11/21  Yes MoCurt BearsMD  Probiotic Product (PROBIOTIC PO) Take 1 capsule by mouth daily.    Yes [provider]  traMADol (ULTRAM) 50 MG tablet Take by mouth See admin instructions. Take 0.5-1 tablet by mouth every 8 hours as needed. Do not combine with other pain medication   Yes [provider]  VITAMIN D PO Take by mouth daily.   Yes [provider]  cephALEXin (KEFLEX) 500 MG capsule Take 1 capsule (500 mg total) by mouth 2 (two) times daily. Patient not taking: Reported on 12/23/2021 11/28/21   Copland, JeGay FillerMD  estradiol (ESTRACE VAGINAL) 0.1 MG/GM vaginal cream Place one gram vaginally up to three times a week as needed to maintain comfort Patient not taking: Reported on 12/23/2021 10/23/21   Copland, JeGay FillerMD  losartan (COZAAR) 25 MG tablet Take 25 mg by mouth daily. Patient not taking: Reported on 12/23/2021 11/01/21   [provider]    Inpatient Medications: Scheduled Meds:  azithromycin  500 mg Oral Daily   calcium-vitamin D  1 tablet Oral Daily   carvedilol  3.125 mg Oral BID WC   Chlorhexidine Gluconate Cloth  6 each Topical Daily   docusate sodium  100 mg Oral BID   enoxaparin (LOVENOX) injection  30 mg Subcutaneous Q24H   furosemide  40 mg Intravenous Daily   gabapentin  300 mg Oral BID   gabapentin  600 mg Oral QHS   ipratropium-albuterol  3 mL Nebulization TID   levothyroxine  75 mcg Oral QAC breakfast   [START ON 12/26/2021] osimertinib mesylate   80 mg Oral Daily   pantoprazole  40 mg Oral Daily   sodium chloride  1 g Oral Daily   traMADol  50 mg Oral Q8H   Continuous Infusions:  sodium chloride 10 mL/hr at 12/25/21  0715   cefTRIAXone (ROCEPHIN)  IV Stopped (12/24/21 1740)   PRN Meds: sodium chloride, methocarbamol, ondansetron, mouth rinse  Allergies:    Allergies  Allergen Reactions   Amlodipine Rash   Prochlorperazine Edisylate Anaphylaxis    Compazine--- tongue swells and rash   Aspirin Other (See Comments)    nose bleeds. Cannot take NSAIDS    Cymbalta [Duloxetine Hcl] Diarrhea, Nausea And Vomiting and Other (See Comments)    Increased blood pressure   Pamelor [Nortriptyline Hcl] Diarrhea and Nausea Only    Increased Heart rate and BP    Social History:   Social History   Socioeconomic History   Marital status: Widowed    Spouse name: Not on file   Number of children: 2   Years of education: Not on file   Highest education level: Not on file  Occupational History   Occupation: n/a  Tobacco Use   Smoking status: Never    Passive exposure: Past   Smokeless tobacco: Never  Vaping Use   Vaping Use: Never used  Substance and Sexual Activity   Alcohol use: Not Currently   Drug use: Never   Sexual activity: Not Currently    Birth control/protection: Surgical  Other Topics Concern   Not on file  Social History Narrative   Lives with husband, daughter and grandchild local.   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   Social Determinants of Health   Financial Resource Strain: [place]  (05/07/2021)   Overall Financial Resource Strain (CARDIA)    Difficulty of Paying Living Expenses: Not hard at all  Food Insecurity: No Food Insecurity (05/07/2021)   Hunger Vital Sign    Worried About Running Out of Food in the Last Year: Never true    [place] in the Last Year: Never true  Transportation Needs: No Transportation Needs (05/07/2021)   PRAPARE - Civil engineer, contracting (Medical): No    Lack of Transportation (Non-Medical): No  Physical Activity: Insufficiently Active (05/07/2021)   Exercise Vital Sign    Days of Exercise per Week: 3 days    Minutes of Exercise per Session: 30 min  Stress: No Stress Concern Present (05/07/2021)   [place]    Feeling of Stress : Not at all  Social Connections: Moderately Integrated (05/07/2021)   Social Connection and Isolation Panel [NHANES]    Frequency of Communication with Friends and Family: More than three times a week    Frequency of Social Gatherings with Friends and Family: More than three times a week    Attends Religious Services: More than 4 times per year    Active Member of [employer] or Organizations: Yes    Attends Archivist Meetings: 1 to 4 times per year    Marital Status: Widowed  Intimate Partner Violence: Not At Risk (05/07/2021)   Humiliation, Afraid, Rape, and Kick questionnaire    Fear of Current or Ex-Partner: No    Emotionally Abused: No    Physically Abused: No    Sexually Abused: No    Family History:    Family History  Problem Relation Age of Onset   Coronary artery disease Father    Colon cancer Father    Diabetes Father    Cancer Father        colon   Other Mother 72       MVA   Healthy Sister  Healthy Brother    Healthy Daughter    Hypothyroidism Daughter    Other Brother        killed in war   Pneumonia Sister    Healthy Daughter    Esophageal cancer Neg Hx    Kidney disease Neg Hx    Liver disease Neg Hx      ROS:  Please see the history of present illness.  Review of Systems  Constitutional:  Negative for fever.  HENT:  Negative for congestion.   Respiratory:  Positive for shortness of breath. Negative for cough.   Cardiovascular:  Positive for palpitations, leg swelling and PND. Negative for chest pain and orthopnea.  Gastrointestinal:  Positive for constipation and  diarrhea. Negative for melena, nausea and vomiting.  Genitourinary:  Negative for hematuria.  Musculoskeletal:  Negative for falls.  Neurological:  Positive for dizziness. Negative for loss of consciousness.  Endo/Heme/Allergies:  Does not bruise/bleed easily.  Psychiatric/Behavioral:  Negative for substance abuse.     All other ROS reviewed and negative.     Physical Exam/Data:   Vitals:   12/25/21 0600 12/25/21 0700 12/25/21 0715 12/25/21 0730  BP: 139/75     Pulse: 92  98 89  Resp: (!) 22  (!) 25 (!) 22  Temp:  98.2 F (36.8 C)    TempSrc:  Oral    SpO2: 96%  92% 95%  Weight:      Height:        Intake/Output Summary (Last 24 hours) at 12/25/2021 0744 Last data filed at 12/25/2021 0715 Gross per 24 hour  Intake 236 ml  Output 300 ml  Net -64 ml      12/25/2021    5:00 AM 12/24/2021    3:36 PM 12/23/2021    4:22 PM  Last 3 Weights  Weight (lbs) 115 lb 8.3 oz 113 lb 1.5 oz 114 lb  Weight (kg) 52.4 kg 51.3 kg 51.71 kg     Body mass index is 22.56 kg/m.  General: 88 y.o. thin Caucasian female resting comfortably in no acute distress. HEENT: Normocephalic and atraumatic. Sclera clear.  Neck: Supple. Prominent external jugular vein. Heart: RRR. Distinct S1 and S2. No murmurs, gallops, or rubs. Radial  pulses 2+ and equal bilaterally. Lungs: No increased work of breathing. Decreased breath sound on the right with faint crackles noted. No wheezes or rhonchi. Abdomen: Soft, non-distended, and non-tender to palpation. Bowel sounds present MSK: Normal strength and tone for age. Extremities: No lower extremity edema.    Skin: Warm and dry. Neuro: Alert and oriented x3. No focal deficits. Psych: Normal affect. Responds appropriately.  EKG:  The EKG was personally reviewed and demonstrates:  Normal sinus rhythm, rate 95 bpm, with known LBBB.  Telemetry:  Telemetry was personally reviewed and demonstrates:  Sinus rhythm with rates mostly in the 70s to 90s.  Relevant CV  Studies:  Echocardiogram 11/08/18/2021: Impressions: 1. Left ventricular ejection fraction, by estimation, is 40 to 45%. Left  ventricular ejection fraction by 3D volume is 45 %. The left ventricle has  mildly decreased function. The left ventricle demonstrates global  hypokinesis. Left ventricular diastolic   parameters are consistent with Grade II diastolic dysfunction  (pseudonormalization). Elevated left atrial pressure.   2. Right ventricular systolic function is normal. The right ventricular  size is normal. There is normal pulmonary artery systolic pressure. The  estimated right ventricular systolic pressure is 26.2 mmHg.   3. Left atrial size was moderately dilated.   4. Right  atrial size was mildly dilated.   5. Large pleural effusion in the left lateral region.   6. 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 Severe  mitral valve regurgitation due to central malcoaptation with flow reversal  in the pulmonary veins and triangular "v-wave cutoff" spectral  configuration.   7. The aortic valve is tricuspid. There is mild thickening of the aortic  valve. Aortic valve regurgitation is trivial. Aortic valve sclerosis is  present, with no evidence of aortic valve stenosis.   8. The inferior vena cava is normal in size with <50% respiratory  variability, suggesting right atrial pressure of 8 mmHg.   Comparison(s): A prior study was performed on 04/17/2021. Prior images  reviewed side by side. The left ventricular function is unchanged. There  is marked worsening of the mitral insufficiency. The leaflets are  restricted and there is central  malcoaptation. LBBB is present but there does not appear to be severe LV  septal-lateral dyssynchrony.   Laboratory Data:  High Sensitivity Troponin:   Recent Labs  Lab 12/25/21 0229  TROPONINIHS 10     Chemistry Recent Labs  Lab 12/23/21 1458 12/24/21 0458 12/25/21 0229  NA 121* 123* 123*  K 4.8 4.7 4.0  CL 87*  92* 88*  CO2 _0 GLUCOSE 124* 90 88  BUN _1 CREATININE 1.03* 1.15* 1.17*  CALCIUM 9.3 8.1* 8.0*  GFRNONAA 52* 46* 45*  ANIONGAP _2 Recent Labs  Lab 12/19/21 1041 12/23/21 1458  PROT 7.2 7.1  ALBUMIN 4.2 4.1  AST 29 27  ALT 13 12  ALKPHOS 31* 34*  BILITOT 0.3 0.4   Lipids No results for input(s): "CHOL", "TRIG", "HDL", "LABVLDL", "LDLCALC", "CHOLHDL" in the last 168 hours.  Hematology Recent Labs  Lab 12/23/21 1458 12/24/21 0458 12/25/21 0229  WBC 6.3 6.7 7.9  RBC 3.67* 3.03* 3.25*  HGB 10.8* 9.1* 9.5*  HCT 32.6* 27.6* 29.5*  MCV 88.8 91.1 90.8  MCH 29.4 30.0 29.2  MCHC 33.1 33.0 32.2  RDW 13.0 13.0 13.1  PLT 169 148* 162   Thyroid No results for input(s): "TSH", "FREET4" in the last 168 hours.  BNP Recent Labs  Lab 12/23/21 1648  BNP 874.2*    DDimer No results for input(s): "DDIMER" in the last 168 hours.   Radiology/Studies:  ECHOCARDIOGRAM COMPLETE  Result Date: 12/24/2021    ECHOCARDIOGRAM REPORT   Patient Name:   [name] Date of Exam: 12/24/2021 Medical Rec #:  [id number]        Height:       60.0 in Accession #:    [id number]       Weight:       114.0 lb Date of Birth:  03/27/1930        BSA:          1.470 m Patient Age:    17 years         BP:           116/70 mmHg Patient Gender: F                HR:           74 bpm. Exam Location:  Inpatient Procedure: 2D Echo, 3D Echo, Cardiac Doppler and Color Doppler Indications:    I50.40* Unspecified combined systolic (congestive) and diastolic                 (congestive) heart failure  History:  Patient has prior history of Echocardiogram examinations, most                 recent 04/17/2021. CHF, Signs/Symptoms:Shortness of Breath,                 Dyspnea and Edema; Risk Factors:Hypertension and Dyslipidemia.                 Lung cancer.  Sonographer:    Roseanna Rainbow RDCS Referring Phys: [id number] [place]  1. Left ventricular ejection fraction, by estimation, is 40 to 45%.  Left ventricular ejection fraction by 3D volume is 45 %. The left ventricle has mildly decreased function. The left ventricle demonstrates global hypokinesis. Left ventricular diastolic  parameters are consistent with Grade II diastolic dysfunction (pseudonormalization). Elevated left atrial pressure.  2. Right ventricular systolic function is normal. The right ventricular size is normal. There is normal pulmonary artery systolic pressure. The estimated right ventricular systolic pressure is 59.5 mmHg.  3. Left atrial size was moderately dilated.  4. Right atrial size was mildly dilated.  5. Large pleural effusion in the left lateral region.  6. 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 Severe mitral valve regurgitation due to central malcoaptation with flow reversal in the pulmonary veins and triangular "v-wave cutoff" spectral configuration.  7. The aortic valve is tricuspid. There is mild thickening of the aortic valve. Aortic valve regurgitation is trivial. Aortic valve sclerosis is present, with no evidence of aortic valve stenosis.  8. The inferior vena cava is normal in size with <50% respiratory variability, suggesting right atrial pressure of 8 mmHg. Comparison(s): A prior study was performed on 04/17/2021. Prior images reviewed side by side. The left ventricular function is unchanged. There is marked worsening of the mitral insufficiency. The leaflets are restricted and there is central malcoaptation. LBBB is present but there does not appear to be severe LV septal-lateral dyssynchrony. FINDINGS  Left Ventricle: Left ventricular ejection fraction, by estimation, is 40 to 45%. Left ventricular ejection fraction by 3D volume is 45 %. The left ventricle has mildly decreased function. The left ventricle demonstrates global hypokinesis. The left ventricular internal cavity size was normal in size. There is no left ventricular hypertrophy. Abnormal (paradoxical) septal motion, consistent with  left bundle branch block. Left ventricular diastolic parameters are consistent with Grade II diastolic dysfunction (pseudonormalization). Elevated left atrial pressure. Right Ventricle: The right ventricular size is normal. No increase in right ventricular wall thickness. Right ventricular systolic function is normal. There is normal pulmonary artery systolic pressure. The tricuspid regurgitant velocity is 2.45 m/s, and  with an assumed right atrial pressure of 8 mmHg, the estimated right ventricular systolic pressure is 63.8 mmHg. Left Atrium: Left atrial size was moderately dilated. Right Atrium: Right atrial size was mildly dilated. Pericardium: There is no evidence of pericardial effusion. Mitral Valve: The mitral valve is rheumatic. There is mild thickening of the anterior and posterior mitral valve leaflet(s). Severe mitral valve regurgitation, with centrally-directed jet. MV peak gradient, 6.4 mmHg. The mean mitral valve gradient is 3.0  mmHg. Tricuspid Valve: The tricuspid valve is normal in structure. Tricuspid valve regurgitation is trivial. Aortic Valve: The aortic valve is tricuspid. There is mild thickening of the aortic valve. Aortic valve regurgitation is trivial. Aortic regurgitation PHT measures 459 msec. Aortic valve sclerosis is present, with no evidence of aortic valve stenosis. Pulmonic Valve: The pulmonic valve was grossly normal. Pulmonic valve regurgitation is not visualized. Aorta: The aortic root and ascending  aorta are structurally normal, with no evidence of dilitation. Venous: A pattern of systolic flow reversal, suggestive of severe mitral regurgitation is recorded from the right upper pulmonary vein. The inferior vena cava is normal in size with less than 50% respiratory variability, suggesting right atrial pressure of 8 mmHg. IAS/Shunts: No atrial level shunt detected by color flow Doppler. Additional Comments: There is a large pleural effusion in the left lateral region.  LEFT  VENTRICLE PLAX 2D LVIDd:         4.80 cm         Diastology LVIDs:         3.40 cm         LV e' medial:    5.87 cm/s LV PW:         0.80 cm         LV E/e' medial:  18.6 LV IVS:        0.80 cm         LV e' lateral:   10.40 cm/s LVOT diam:     2.10 cm         LV E/e' lateral: 10.5 LV SV:         61 LV SV Index:   41 LVOT Area:     3.46 cm        3D Volume EF                                LV 3D EF:    Left                                             ventricul LV Volumes (MOD)                            ar LV vol d, MOD    128.0 ml                   ejection A2C:                                        fraction LV vol d, MOD    100.3 ml                   by 3D A4C:                                        volume is LV vol s, MOD    64.7 ml                    45 %. A2C: LV vol s, MOD    64.8 ml A4C:                           3D Volume EF: LV SV MOD A2C:   63.3 ml       3D EF:        45 % LV SV MOD A4C:   100.3 ml      LV EDV:  134 ml LV SV MOD BP:    52.6 ml       LV ESV:       74 ml                                LV SV:        60 ml RIGHT VENTRICLE             IVC RV S prime:     12.60 cm/s  IVC diam: 1.60 cm TAPSE (M-mode): 2.0 cm LEFT ATRIUM             Index        RIGHT ATRIUM           Index LA diam:        4.40 cm 2.99 cm/m   RA Area:     12.50 cm LA Vol (A2C):   43.7 ml 29.73 ml/m  RA Volume:   26.30 ml  17.89 ml/m LA Vol (A4C):   54.4 ml 37.01 ml/m LA Biplane Vol: 53.0 ml 36.06 ml/m  AORTIC VALVE LVOT Vmax:   88.60 cm/s LVOT Vmean:  59.600 cm/s LVOT VTI:    0.175 m AI PHT:      459 msec  AORTA Ao Root diam: 3.20 cm Ao Asc diam:  3.00 cm MITRAL VALVE                  TRICUSPID VALVE MV Area (PHT): 4.30 cm       TR Peak grad:   24.0 mmHg MV Area VTI:   1.54 cm       TR Vmax:        245.00 cm/s MV Peak grad:  6.4 mmHg MV Mean grad:  3.0 mmHg       SHUNTS MV Vmax:       1.26 m/s       Systemic VTI:  0.18 m MV Vmean:      81.5 cm/s      Systemic Diam: 2.10 cm MV Decel Time: 176 msec MR Peak grad:     62.1 mmHg MR Mean grad:    30.5 mmHg MR Vmax:         394.00 cm/s MR Vmean:        242.5 cm/s MR PISA:         4.02 cm MR PISA Eff ROA: 39 mm MR PISA Radius:  0.80 cm MV E velocity: 109.00 cm/s MV A velocity: 65.47 cm/s MV E/A ratio:  1.66 Mihai Croitoru MD Electronically signed by Sanda Klein MD Signature Date/Time: 12/24/2021/11:04:46 AM    Final    CT Angio Chest PE W/Cm &/Or Wo Cm  Result Date: 12/23/2021 CLINICAL DATA:  Right upper lobe non-small cell lung cancer, status post right upper lobectomy and right middle lobe wedge resection. Shortness of breath for 1 week.  Dyspnea at rest. * Tracking Code: BO * EXAM: CT ANGIOGRAPHY CHEST WITH CONTRAST TECHNIQUE: Multidetector CT imaging of the chest was performed using the standard protocol during bolus administration of intravenous contrast. Multiplanar CT image reconstructions and MIPs were obtained to evaluate the vascular anatomy. RADIATION DOSE REDUCTION: This exam was performed according to the departmental dose-optimization program which includes automated exposure control, adjustment of the mA and/or kV according to patient size and/or use of iterative reconstruction technique. CONTRAST:  66m OMNIPAQUE IOHEXOL 350 MG/ML SOLN COMPARISON:  09/27/2021 FINDINGS: Cardiovascular: No filling defect is  identified in the pulmonary arterial tree to suggest pulmonary embolus. Aortic atherosclerosis.  Mild cardiomegaly. Mediastinum/Nodes: Partial obscuration of mediastinal structures due to the very dense contrast medium in the SVC and brachiocephalic vein. No overt pathologic adenopathy identified. Moderate-sized hiatal hernia. Lungs/Pleura: Moderate right pleural effusion, cannot exclude exudative/complex component. Small left pleural effusion. Postoperative findings in the right lung. Confluent ground-glass density opacities scattered in the right lower lobe, obscuring the previous ground-glass density nodules to some degree. Substantial passive atelectasis  in the right lung. Emphysema noted. Patchy ground-glass densities in the left upper lobe corresponding to secondary pulmonary lobules with associated septal thickening in the apex compatible with interstitial edema and mild airspace edema. Passive atelectasis in the left lower lobe. Reduce conspicuity of some of the previous pulmonary nodules although some of this reduced conspicuity may be due to surrounding ground-glass opacities and also from motion artifact. I suspect that many of these pulmonary nodules are still present. Upper Abdomen: Unremarkable Musculoskeletal: Dextroconvex lumbar scoliosis. Severe degenerative glenohumeral arthropathy bilaterally. No compelling findings of osseous metastatic disease. Review of the MIP images confirms the above findings. IMPRESSION: 1. No filling defect is identified in the pulmonary arterial tree to suggest pulmonary embolus. 2. New moderate right pleural effusion, cannot exclude exudative/complex component. New small left pleural effusion. 3. Interstitial edema and scattered ground-glass density opacities which along with motion artifact is thought to be obscuring some of the previous pulmonary nodules. 4. Mild cardiomegaly. 5. Moderate-sized hiatal hernia. 6. Severe degenerative glenohumeral arthropathy bilaterally. 7. Aortic atherosclerosis. Aortic Atherosclerosis (ICD10-I70.0). Electronically Signed   By: Van Clines M.D.   On: 12/23/2021 18:14   DG Chest 2 View  Result Date: 12/23/2021 CLINICAL DATA:  Cough, lung cancer. EXAM: CHEST - 2 VIEW COMPARISON:  July 29, 2014.  September 30, 2021. FINDINGS: Stable cardiomediastinal silhouette. Right basilar opacity is noted concerning for pneumonia or atelectasis and associated pleural effusion. Underlying malignancy cannot be excluded. Mild left basilar atelectasis or scarring is noted. Bony thorax is unremarkable. IMPRESSION: Right basilar opacity is noted concerning for pneumonia or atelectasis and associated  pleural effusion. Underlying malignancy cannot be excluded. Electronically Signed   By: Marijo Conception M.D.   On: 12/23/2021 14:42     Assessment and Plan:   Acute Hypoxic Respiratory Failure  Bilateral Pleural Effusions Non-Small Cell Lung Cancer Patient presented with acute hypoxic respiratory failure after being found to have an O2 sat of 84% in Oncology office. BNP elevated in the 800s. Chest CTA showed new moderate pleural effusion on the right and small pleural effusion on the left as well as interstitial edema and scattered ground glass density opacities. Echo showed unchanged EF of 40-45% with global hypokinesis and grade 2 diastolic dysfunction. - See recommendations for diuresis as below. - She is going for right thoracentesis today. - Otherwise, management per Pulmonology.  Acute on Chronic Combined CHF  Dilated Cardiomyopathy BNP elevated in the 800s. Chest CTA as above. Echo shows unchanged Echo showed unchanged EF of 40-45% with global hypokinesis and grade 2 diastolic dysfunction as well as marked worsening of his MR now considered severe. Started on IV Lasix but still net positive 43.5 mL. Weight up 2 lbs from yesterday if accurate. Renal function stable. - She does not appear grossly volume overloaded on exam. - Currently on IV Lasix 53m daily. OK to continue for now but will discuss with MD about switching to PO soon. - Continue Coreg 3.1266mtwice daily. - Previously on Losartan but patient reported  not taking this on admission. Unclear why (did not get a chance to ask patient as she was being taken down for thoracentesis). Consider restarting. - Continue to monitor daily weights, strict I/Os, and renal function.  Severe Mitral Regurgitation Echo this admission shows marked worsening of patient MR. Echo in 04/2021 showed mild MR but Echo this admission rheumatic/ postinflammatory appearing mitral valve with severe MR due to central malcoaptation with flow reversal in the  pulmonary veins and triangular "v-wave cut off" spectral configuration. - May need TEE for further evaluation. However, given lung cancer, I don't know if she would be a candidate for any type of repair. Will review with MD.  Acute on Chronic Hyponatremia Sodium 121 on admission and 123 today. She was initially felt to be volume overloaded but also reported diarrhea with chemotherapy. - Degree of hyponatremia seems out of proportion to volume status. - Management per primary team.  CKD Stage III Creatinine stable at 1.17 today which is close to baseline.    Risk Assessment/Risk Scores:    New [employer] Heart Association (NYHA) Functional Class NYHA Class III-IV  For questions or updates, please contact [place] Please consult [email or web address] for contact info under    Signed, Darreld Mclean, PA-C  12/25/2021 7:44 AM

## 2021-12-25 NOTE — Progress Notes (Signed)
DIAGNOSIS: Multifocal non-small cell lung cancer, adenocarcinoma initially diagnosed as stage IIA (T2b, N0, M0) non-small cell lung cancer, adenocarcinoma with positive EGFR mutation in exon 21 (L858R) presented with right upper lobe lung mass diagnosed in March of 2015.   PRIOR THERAPY: Status post right upper lobectomy with wedge resection of the right middle lobe under the care of Dr. Servando Snare on 06/07/2013   CURRENT THERAPY: Tagrisso 80 mg p.o. daily started November 19, 2021.  Status post 4 weeks of treatment.  Subjective: The patient is seen and examined today.  Her daughter [name] was at the bedside.  The patient is feeling much better today except for the baseline shortness of breath.  She has been tolerating her treatment with Tagrisso fairly well but few days ago she started having more shortness of breath and cough.  She was admitted to the hospital and was found to have no evidence for pulmonary embolism but there was new moderate right pleural effusion as well as a new small left pleural effusion with interstitial edema and scattered groundglass density opacity obscuring some of the previous pulmonary nodules.  She also had mild cardiomyopathy.  She had 2D echo that showed evidence for congestive heart failure with ejection fraction in the range of 40-45%. Her repeat EKG showed evidence for QT prolongation.  Objective: Vital signs in last 24 hours: Temp:  [97.5 F (36.4 C)-98.2 F (36.8 C)] 98.2 F (36.8 C) (11/08 1517) Pulse Rate:  [70-103] 102 (11/08 1500) Resp:  [12-26] 19 (11/08 1500) BP: (115-150)/(58-98) 130/86 (11/08 1500) SpO2:  [90 %-98 %] 96 % (11/08 1500) Weight:  [115 lb 8.3 oz (52.4 kg)] 115 lb 8.3 oz (52.4 kg) (11/08 0500)  Intake/Output from previous day: 11/07 0701 - 11/08 0700 In: 223.5 [I.V.:123.4; IV Piggyback:100.1] Out: 300 [Urine:300] Intake/Output this shift: Total I/O In: 20 [I.V.:20] Out: -   General appearance: alert, cooperative, fatigued, and no  distress Resp: diminished breath sounds RLL and dullness to percussion RLL Cardio: regular rate and rhythm, S1, S2 normal, no murmur, click, rub or gallop GI: soft, non-tender; bowel sounds normal; no masses,  no organomegaly Extremities: extremities normal, atraumatic, no cyanosis or edema  Lab Results:  Recent Labs    12/24/21 0458 12/25/21 0229  WBC 6.7 7.9  HGB 9.1* 9.5*  HCT 27.6* 29.5*  PLT 148* 162   BMET Recent Labs    12/24/21 0458 12/25/21 0229  NA 123* 123*  K 4.7 4.0  CL 92* 88*  CO2 26 23  GLUCOSE 90 88  BUN 20 19  CREATININE 1.15* 1.17*  CALCIUM 8.1* 8.0*    Studies/Results: US THORACENTESIS ASP PLEURAL SPACE W/IMG GUIDE  Result Date: 12/25/2021 INDICATION: Patient with history of non-small cell lung cancer and prior right upper lobectomy and right middle lobe wedge resection, dyspnea, CHF, bilateral pleural effusions; request received for diagnostic and therapeutic right thoracentesis. EXAM: ULTRASOUND GUIDED DIAGNOSTIC AND THERAPEUTIC RIGHT THORACENTESIS MEDICATIONS: 8 mL 1% lidocaine COMPLICATIONS: None immediate. PROCEDURE: An ultrasound guided thoracentesis was thoroughly discussed with the patient and questions answered. The benefits, risks, alternatives and complications were also discussed. The patient understands and wishes to proceed with the procedure. Written consent was obtained. Ultrasound was performed to localize and mark an adequate pocket of fluid in the right chest. The area was then prepped and draped in the normal sterile fashion. 1% Lidocaine was used for local anesthesia. Under ultrasound guidance a 6 Fr Safe-T-Centesis catheter was introduced. Thoracentesis was performed. The catheter was removed and a  dressing applied. FINDINGS: A total of approximately 800 cc of amber fluid was removed. Samples were sent to the laboratory as requested by the clinical team. IMPRESSION: Successful ultrasound guided diagnostic and therapeutic right thoracentesis  yielding 800 cc of pleural fluid. Read by: Rowe Robert, PA-C Electronically Signed   By: Ruthann Cancer M.D.   On: 12/25/2021 10:42   DG Chest 1 View  Result Date: 12/25/2021 CLINICAL DATA:  Post right thoracentesis EXAM: PORTABLE CHEST 1 VIEW COMPARISON:  12/23/2021 FINDINGS: There has been interval reduction in right-sided pleural effusion when compare with the prior exam. Some persistent scarring is noted as well as fullness in the right hilar region stable from the prior exam. Postsurgical changes are again seen. No pneumothorax is noted. Scarring in the left base is again identified. IMPRESSION: No pneumothorax following thoracentesis. Electronically Signed   By: Inez Catalina M.D.   On: 12/25/2021 10:12   ECHOCARDIOGRAM COMPLETE  Result Date: 12/24/2021    ECHOCARDIOGRAM REPORT   Patient Name:   ARNECIA ECTOR Date of Exam: 12/24/2021 Medical Rec #:  [id number]        Height:       60.0 in Accession #:    [id number]       Weight:       114.0 lb Date of Birth:  04-10-1930        BSA:          1.470 m Patient Age:    88 years         BP:           116/70 mmHg Patient Gender: F                HR:           74 bpm. Exam Location:  Inpatient Procedure: 2D Echo, 3D Echo, Cardiac Doppler and Color Doppler Indications:    I50.40* Unspecified combined systolic (congestive) and diastolic                 (congestive) heart failure  History:        Patient has prior history of Echocardiogram examinations, most                 recent 04/17/2021. CHF, Signs/Symptoms:Shortness of Breath,                 Dyspnea and Edema; Risk Factors:Hypertension and Dyslipidemia.                 Lung cancer.  Sonographer:    Roseanna Rainbow RDCS Referring Phys: [id number] [place]  1. Left ventricular ejection fraction, by estimation, is 40 to 45%. Left ventricular ejection fraction by 3D volume is 45 %. The left ventricle has mildly decreased function. The left ventricle demonstrates global hypokinesis. Left ventricular  diastolic  parameters are consistent with Grade II diastolic dysfunction (pseudonormalization). Elevated left atrial pressure.  2. Right ventricular systolic function is normal. The right ventricular size is normal. There is normal pulmonary artery systolic pressure. The estimated right ventricular systolic pressure is 31.5 mmHg.  3. Left atrial size was moderately dilated.  4. Right atrial size was mildly dilated.  5. Large pleural effusion in the left lateral region.  6. 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 Severe mitral valve regurgitation due to central malcoaptation with flow reversal in the pulmonary veins and triangular "v-wave cutoff" spectral configuration.  7. The aortic valve is tricuspid. There is mild thickening of the aortic valve.  Aortic valve regurgitation is trivial. Aortic valve sclerosis is present, with no evidence of aortic valve stenosis.  8. The inferior vena cava is normal in size with <50% respiratory variability, suggesting right atrial pressure of 8 mmHg. Comparison(s): A prior study was performed on 04/17/2021. Prior images reviewed side by side. The left ventricular function is unchanged. There is marked worsening of the mitral insufficiency. The leaflets are restricted and there is central malcoaptation. LBBB is present but there does not appear to be severe LV septal-lateral dyssynchrony. FINDINGS  Left Ventricle: Left ventricular ejection fraction, by estimation, is 40 to 45%. Left ventricular ejection fraction by 3D volume is 45 %. The left ventricle has mildly decreased function. The left ventricle demonstrates global hypokinesis. The left ventricular internal cavity size was normal in size. There is no left ventricular hypertrophy. Abnormal (paradoxical) septal motion, consistent with left bundle branch block. Left ventricular diastolic parameters are consistent with Grade II diastolic dysfunction (pseudonormalization). Elevated left atrial pressure. Right  Ventricle: The right ventricular size is normal. No increase in right ventricular wall thickness. Right ventricular systolic function is normal. There is normal pulmonary artery systolic pressure. The tricuspid regurgitant velocity is 2.45 m/s, and  with an assumed right atrial pressure of 8 mmHg, the estimated right ventricular systolic pressure is 19.6 mmHg. Left Atrium: Left atrial size was moderately dilated. Right Atrium: Right atrial size was mildly dilated. Pericardium: There is no evidence of pericardial effusion. Mitral Valve: The mitral valve is rheumatic. There is mild thickening of the anterior and posterior mitral valve leaflet(s). Severe mitral valve regurgitation, with centrally-directed jet. MV peak gradient, 6.4 mmHg. The mean mitral valve gradient is 3.0  mmHg. Tricuspid Valve: The tricuspid valve is normal in structure. Tricuspid valve regurgitation is trivial. Aortic Valve: The aortic valve is tricuspid. There is mild thickening of the aortic valve. Aortic valve regurgitation is trivial. Aortic regurgitation PHT measures 459 msec. Aortic valve sclerosis is present, with no evidence of aortic valve stenosis. Pulmonic Valve: The pulmonic valve was grossly normal. Pulmonic valve regurgitation is not visualized. Aorta: The aortic root and ascending aorta are structurally normal, with no evidence of dilitation. Venous: A pattern of systolic flow reversal, suggestive of severe mitral regurgitation is recorded from the right upper pulmonary vein. The inferior vena cava is normal in size with less than 50% respiratory variability, suggesting right atrial pressure of 8 mmHg. IAS/Shunts: No atrial level shunt detected by color flow Doppler. Additional Comments: There is a large pleural effusion in the left lateral region.  LEFT VENTRICLE PLAX 2D LVIDd:         4.80 cm         Diastology LVIDs:         3.40 cm         LV e' medial:    5.87 cm/s LV PW:         0.80 cm         LV E/e' medial:  18.6 LV IVS:         0.80 cm         LV e' lateral:   10.40 cm/s LVOT diam:     2.10 cm         LV E/e' lateral: 10.5 LV SV:         61 LV SV Index:   41 LVOT Area:     3.46 cm        3D Volume EF  LV 3D EF:    Left                                             ventricul LV Volumes (MOD)                            ar LV vol d, MOD    128.0 ml                   ejection A2C:                                        fraction LV vol d, MOD    100.3 ml                   by 3D A4C:                                        volume is LV vol s, MOD    64.7 ml                    45 %. A2C: LV vol s, MOD    64.8 ml A4C:                           3D Volume EF: LV SV MOD A2C:   63.3 ml       3D EF:        45 % LV SV MOD A4C:   100.3 ml      LV EDV:       134 ml LV SV MOD BP:    52.6 ml       LV ESV:       74 ml                                LV SV:        60 ml RIGHT VENTRICLE             IVC RV S prime:     12.60 cm/s  IVC diam: 1.60 cm TAPSE (M-mode): 2.0 cm LEFT ATRIUM             Index        RIGHT ATRIUM           Index LA diam:        4.40 cm 2.99 cm/m   RA Area:     12.50 cm LA Vol (A2C):   43.7 ml 29.73 ml/m  RA Volume:   26.30 ml  17.89 ml/m LA Vol (A4C):   54.4 ml 37.01 ml/m LA Biplane Vol: 53.0 ml 36.06 ml/m  AORTIC VALVE LVOT Vmax:   88.60 cm/s LVOT Vmean:  59.600 cm/s LVOT VTI:    0.175 m AI PHT:      459 msec  AORTA Ao Root diam: 3.20 cm Ao Asc diam:  3.00 cm MITRAL VALVE                  TRICUSPID VALVE MV Area (  PHT): 4.30 cm       TR Peak grad:   24.0 mmHg MV Area VTI:   1.54 cm       TR Vmax:        245.00 cm/s MV Peak grad:  6.4 mmHg MV Mean grad:  3.0 mmHg       SHUNTS MV Vmax:       1.26 m/s       Systemic VTI:  0.18 m MV Vmean:      81.5 cm/s      Systemic Diam: 2.10 cm MV Decel Time: 176 msec MR Peak grad:    62.1 mmHg MR Mean grad:    30.5 mmHg MR Vmax:         394.00 cm/s MR Vmean:        242.5 cm/s MR PISA:         4.02 cm MR PISA Eff ROA: 39 mm MR PISA Radius:  0.80 cm MV E velocity:  109.00 cm/s MV A velocity: 65.47 cm/s MV E/A ratio:  1.66 Mihai Croitoru MD Electronically signed by Sanda Klein MD Signature Date/Time: 12/24/2021/11:04:46 AM    Final    CT Angio Chest PE W/Cm &/Or Wo Cm  Result Date: 12/23/2021 CLINICAL DATA:  Right upper lobe non-small cell lung cancer, status post right upper lobectomy and right middle lobe wedge resection. Shortness of breath for 1 week.  Dyspnea at rest. * Tracking Code: BO * EXAM: CT ANGIOGRAPHY CHEST WITH CONTRAST TECHNIQUE: Multidetector CT imaging of the chest was performed using the standard protocol during bolus administration of intravenous contrast. Multiplanar CT image reconstructions and MIPs were obtained to evaluate the vascular anatomy. RADIATION DOSE REDUCTION: This exam was performed according to the departmental dose-optimization program which includes automated exposure control, adjustment of the mA and/or kV according to patient size and/or use of iterative reconstruction technique. CONTRAST:  61m OMNIPAQUE IOHEXOL 350 MG/ML SOLN COMPARISON:  09/27/2021 FINDINGS: Cardiovascular: No filling defect is identified in the pulmonary arterial tree to suggest pulmonary embolus. Aortic atherosclerosis.  Mild cardiomegaly. Mediastinum/Nodes: Partial obscuration of mediastinal structures due to the very dense contrast medium in the SVC and brachiocephalic vein. No overt pathologic adenopathy identified. Moderate-sized hiatal hernia. Lungs/Pleura: Moderate right pleural effusion, cannot exclude exudative/complex component. Small left pleural effusion. Postoperative findings in the right lung. Confluent ground-glass density opacities scattered in the right lower lobe, obscuring the previous ground-glass density nodules to some degree. Substantial passive atelectasis in the right lung. Emphysema noted. Patchy ground-glass densities in the left upper lobe corresponding to secondary pulmonary lobules with associated septal thickening in the apex  compatible with interstitial edema and mild airspace edema. Passive atelectasis in the left lower lobe. Reduce conspicuity of some of the previous pulmonary nodules although some of this reduced conspicuity may be due to surrounding ground-glass opacities and also from motion artifact. I suspect that many of these pulmonary nodules are still present. Upper Abdomen: Unremarkable Musculoskeletal: Dextroconvex lumbar scoliosis. Severe degenerative glenohumeral arthropathy bilaterally. No compelling findings of osseous metastatic disease. Review of the MIP images confirms the above findings. IMPRESSION: 1. No filling defect is identified in the pulmonary arterial tree to suggest pulmonary embolus. 2. New moderate right pleural effusion, cannot exclude exudative/complex component. New small left pleural effusion. 3. Interstitial edema and scattered ground-glass density opacities which along with motion artifact is thought to be obscuring some of the previous pulmonary nodules. 4. Mild cardiomegaly. 5. Moderate-sized hiatal hernia. 6. Severe degenerative glenohumeral arthropathy bilaterally. 7. Aortic  atherosclerosis. Aortic Atherosclerosis (ICD10-I70.0). Electronically Signed   By: Van Clines M.D.   On: 12/23/2021 18:14    Medications: I have reviewed the patient's current medications.   Assessment/Plan: This is a very pleasant 88 years old white female with multifocal non-small cell lung cancer, adenocarcinoma that was initially diagnosed as a stage IIa with positive EGFR mutation in exon 21 (L858R) presented with right upper lobe lung mass in March 2015 status post right upper lobectomy with wedge resection of the right middle lobe on June 07, 2013 and has been in observation since that time until October 2023 when she had evidence for disease progression with multiple enlarging pulmonary nodules.  The patient started treatment with Tagrisso 80 mg p.o. daily and has been tolerating it fairly well until  her recent admission with the shortness of breath and she was found to have evidence for mild congestive heart failure and pulmonary opacities suspicious for pulmonary edema versus drug-induced pneumonitis. The patient also has mild QT prolongation can paired to previous EKGs but the increase is noted over 60 ms. I had a lengthy discussion with the patient and her daughter today about her current condition. The patient underwent ultrasound-guided right thoracentesis with drainage of around 800 cc of pleural fluid but the cytology still pending. I recommended for her to continue to hold her treatment with Tagrisso for now.  Tagrisso can increase the QT prolongation and also can cause some decline in the cardiac function. If no improvement in the pulmonary opacities with the diuretics that likely to be consistent with pulmonary edema rather than drug-induced pneumonitis, I will discontinue her treatment with Tagrisso and treat the patient with high-dose steroids followed by change to a different EGFR tyrosine kinase inhibitor. I will arrange for the patient a follow-up appointment with me after discharge for more detailed discussion of her treatment options. The patient and her daughter are in agreement with the current plan. Thank you for taking good care of of Ms. Brumbach.  I will continue to follow-up the patient with you and assist in her management on as-needed basis.  LOS: 2 days    Eilleen Kempf 12/25/2021

## 2021-12-25 NOTE — Telephone Encounter (Signed)
Patient currently admitted in hospital.  Knox Saliva, PharmD, MPH, BCPS, CPP Clinical Pharmacist (Rheumatology and Pulmonology)

## 2021-12-25 NOTE — Progress Notes (Signed)
PT Cancellation Note  Patient Details Name: BRITTLEY REGNER MRN: [id number] DOB: [birth year]   Cancelled Treatment:    Reason Eval/Treat Not Completed: Patient at procedure or test/unavailable, going for TC. Will check back another time. [place] Office [phone] Weekend pager-[phone]    [name] 12/25/2021, 9:01 AM

## 2021-12-25 NOTE — Procedures (Signed)
Ultrasound-guided diagnostic and therapeutic right thoracentesis performed yielding 800 cc of amber fluid. No immediate complications. Follow-up chest x-ray pending. The fluid was sent to the lab for preordered studies. EBL none.

## 2021-12-25 NOTE — Progress Notes (Signed)
Pt seen and given scheduled nebulizer treatment which she tolerated well.  HR85, rr15, spo2 98% on 3lnc.  No increased wob / respiratory distress noted or voiced by patient.  Bipap not indicated at this time.

## 2021-12-26 ENCOUNTER — Encounter: Payer: Self-pay | Admitting: Family Medicine

## 2021-12-26 DIAGNOSIS — C3411 Malignant neoplasm of upper lobe, right bronchus or lung: Secondary | ICD-10-CM | POA: Diagnosis not present

## 2021-12-26 DIAGNOSIS — I5043 Acute on chronic combined systolic (congestive) and diastolic (congestive) heart failure: Secondary | ICD-10-CM | POA: Diagnosis not present

## 2021-12-26 DIAGNOSIS — E871 Hypo-osmolality and hyponatremia: Secondary | ICD-10-CM | POA: Diagnosis not present

## 2021-12-26 DIAGNOSIS — J9601 Acute respiratory failure with hypoxia: Secondary | ICD-10-CM | POA: Diagnosis not present

## 2021-12-26 DIAGNOSIS — I420 Dilated cardiomyopathy: Secondary | ICD-10-CM

## 2021-12-26 LAB — COMPREHENSIVE METABOLIC PANEL
ALT: 15 U/L (ref 0–44)
AST: 25 U/L (ref 15–41)
Albumin: 2.8 g/dL — ABNORMAL LOW (ref 3.5–5.0)
Alkaline Phosphatase: 39 U/L (ref 38–126)
Anion gap: 10 (ref 5–15)
BUN: 21 mg/dL (ref 8–23)
CO2: 28 mmol/L (ref 22–32)
Calcium: 8 mg/dL — ABNORMAL LOW (ref 8.9–10.3)
Chloride: 89 mmol/L — ABNORMAL LOW (ref 98–111)
Creatinine, Ser: 1.16 mg/dL — ABNORMAL HIGH (ref 0.44–1.00)
GFR, Estimated: 45 mL/min — ABNORMAL LOW (ref >60)
Glucose, Bld: 85 mg/dL (ref 70–99)
Potassium: 3.4 mmol/L — ABNORMAL LOW (ref 3.5–5.1)
Sodium: 127 mmol/L — ABNORMAL LOW (ref 135–145)
Total Bilirubin: 0.6 mg/dL (ref 0.3–1.2)
Total Protein: 5.9 g/dL — ABNORMAL LOW (ref 6.5–8.1)

## 2021-12-26 LAB — LACTATE DEHYDROGENASE, PLEURAL OR PERITONEAL FLUID: LD, Fluid: 71 U/L — ABNORMAL HIGH (ref 3–23)

## 2021-12-26 LAB — CYTOLOGY - NON PAP

## 2021-12-26 LAB — PHOSPHORUS: Phosphorus: 3.2 mg/dL (ref 2.5–4.6)

## 2021-12-26 LAB — CBC
HCT: 27.9 % — ABNORMAL LOW (ref 36.0–46.0)
Hemoglobin: 9.2 g/dL — ABNORMAL LOW (ref 12.0–15.0)
MCH: 29.8 pg (ref 26.0–34.0)
MCHC: 33 g/dL (ref 30.0–36.0)
MCV: 90.3 fL (ref 80.0–100.0)
Platelets: 171 10*3/uL (ref 150–400)
RBC: 3.09 MIL/uL — ABNORMAL LOW (ref 3.87–5.11)
RDW: 13.1 % (ref 11.5–15.5)
WBC: 6.2 10*3/uL (ref 4.0–10.5)
nRBC: 0 % (ref 0.0–0.2)

## 2021-12-26 LAB — LEGIONELLA PNEUMOPHILA SEROGP 1 UR AG: L. pneumophila Serogp 1 Ur Ag: NEGATIVE

## 2021-12-26 LAB — MAGNESIUM: Magnesium: 1.8 mg/dL (ref 1.7–2.4)

## 2021-12-26 LAB — LACTATE DEHYDROGENASE: LDH: 127 U/L (ref 98–192)

## 2021-12-26 MED ORDER — MAGNESIUM SULFATE 2 GM/50ML IV SOLN
2.0000 g | Freq: Once | INTRAVENOUS | Status: AC
  Administered 2021-12-26: 2 g via INTRAVENOUS
  Filled 2021-12-26: qty 50

## 2021-12-26 MED ORDER — POTASSIUM CHLORIDE CRYS ER 20 MEQ PO TBCR
40.0000 meq | EXTENDED_RELEASE_TABLET | Freq: Once | ORAL | Status: AC
  Administered 2021-12-26: 40 meq via ORAL
  Filled 2021-12-26: qty 2

## 2021-12-26 MED ORDER — RISAQUAD PO CAPS
1.0000 | ORAL_CAPSULE | Freq: Every day | ORAL | Status: DC
  Administered 2021-12-26 – 2021-12-29 (×4): 1 via ORAL
  Filled 2021-12-26 (×4): qty 1

## 2021-12-26 MED ORDER — HYDROCODONE-ACETAMINOPHEN 5-325 MG PO TABS
1.0000 | ORAL_TABLET | ORAL | Status: AC | PRN
  Administered 2021-12-26: 1 via ORAL
  Filled 2021-12-26: qty 1

## 2021-12-26 MED ORDER — ALBUTEROL SULFATE (2.5 MG/3ML) 0.083% IN NEBU: 2.5000 mg | INHALATION_SOLUTION | RESPIRATORY_TRACT | Status: DC | PRN

## 2021-12-26 NOTE — Progress Notes (Signed)
Consultation Progress Note   Patient: [name] ASN:[id number] DOB: January 18, 1931 DOA: 12/23/2021 DOS: the patient was seen and examined on 12/26/2021 Primary service: Ionia Schey, Manfred Shirts, MD  Brief hospital course: [name] is a 88 y.o. female with medical history significant of multifocal non-small cell lung cancer, dilated cardiomyopathy, combined systolic and diastolic HF, HTN, seronegative RA,osteoporosis,sicca syndrome, raynaud'sdisease, anemia of chronic disease who presents with hypoxia and shortness of breath.    She has noticed increase shortness of breath with mild cough for the past 3 days. No fever. Trace edema and has not taken Lasix in the past 3 days. Had nausea but no vomiting. Occasional diarrhea due to her chemotherapy. Has good appetite. Pt is independent and lives by herself with daughter nearby.   Pt had follow up with oncology today and was noted to be hypoxic to 84% on RA with wheezing and mild respiratory distress. CXR showing right basilar opacity concerning for pneumonia vs atelectasis and pleural effusion. Also noted to have acute on chronic LE edema with weight. She was advised to present to ED.    In the ED, she was afebrile, normotensive and ultimately required Bipap.  WBC of 6.3, hemoglobin of 10.8.  Lactate within normal limits at 1.2. Sodium of 121, K of 4.8, chloride of 87, creatinine 1.03 BNP of 874   Negative flu/COVID PCR   CTA chest shows no pulmonary embolism but there is new moderate right pleural effusion, new small left pleural effusion.  Interstitial edema and scattered groundglass density opacities.   She was given IV Rocephin, azithromycin and 20 mg of IV Lasix in the ED.  Hospitalist then consulted for admission.  Assessment and Plan: Principal Problem:   Acute respiratory failure with hypoxia (HCC) Active Problems:   Lung cancer, Right upper lobe   Rheumatoid arthritis involving multiple joints (HCC)   Hyponatremia   Anemia of  chronic disease   HTN (hypertension)   Acute on chronic combined systolic and diastolic CHF (congestive heart failure) (HCC)   Acute respiratory failure (HCC)      Acute respiratory failure with hypoxia (HCC) Secondary to bilateral pleural effusion from acute on chronic combined CHF and possible pneumonia 11/8 and 11/9-Status  post thoracentesis yielding 800 cc of amber fluid,likely transudative,  she is currently on nasal cannula.  Patient and daughter state that her breathing has significantly improved. Remains on diuresis and antibiotics. -BiPAP as needed -DuoNeb bronchodilators   Bilateral pleural effusion -Status postthoracentesis, follow labs for further analysis cytology, culture    Acute on chronic combined systolic and diastolic CHF (congestive heart failure) ([place]) Last echocardiogram on 04/2021 with EF of 40 to 45% with grade 1 diastolic dysfunction.  2D echo reviewed: Left LV EJF 40 to 45%.mildly decreased function. The left ventricle demonstrates global  hypokinesis. Left ventricular diastolic  parameters are consistent with Grade II diastolic dysfunction Mitral regurgitation -Cardiology plans to diurese to obtain euvolemic state and consider limited echo to reassess MR.    -IV Lasix 40mg  daily -strict intake and output -daily wts -Cardiology team consulted, appreciate input   HTN (hypertension) Stable. On IV Lasix.    Anemia of chronic disease Stable Hgb at baseline of 10.8    Hyponatremia 127 (10/17)- 126 (11/2)- 121-123 on presentation -pt is fluid overload but also has diarrhea frequently due to chemo. Check serum osm, urine osm and urine sodium -monitor while on IV diuretic   Acute on chronic hyponatremia  -Serum sodium 121, 123 -Adding supplement sodium p.o. 1  g daily We will monitor closely likely exacerbated by lung CA    rheumatoid arthritis involving multiple joints ([place]) On IV Orencia followed by rheumatology--hold for now   Nonsmall cell lung  cancer, adenocarcinoma, stage 2a  -s/p right upper lobectomy and right middle lobe wedge resection in 2015  multifocal non-small cell lung cancer -follows with Dr. Julien Nordmann  -On Tagrisso although concerned about cardiotoxicity with current CHF exacerbation Discussed with Dr. Earlie Server okay to withhold for now             [facility] will continue to follow the patient.      [facility] will continue to follow the patient.  Subjective: No complaints this morning, she was having breakfast.  Physical Exam:  GEN: Well nourished, well developed in no acute distress NECK: Supple CARDIAC: regular rhythm, normal S1 and S2, no rubs or gallops. 6-4/3 holosystolic murmur, at apex VASCULAR: Radial pulses 2+ bilaterally.  RESPIRATORY:  Clear in upper fields, diminished breath sounds bilaterally  ABDOMEN: Soft, non-tender, non-distended MUSCULOSKELETAL:  Moves all 4 limbs independently SKIN: Warm and dry, no edema NEUROLOGIC:  No focal neuro deficits noted. PSYCHIATRIC:  Normal affect  Vitals:   12/26/21 0900 12/26/21 1000 12/26/21 1100 12/26/21 1154  BP:    135/78  Pulse: 76 81 84 87  Resp: 12 19 20 20   Temp:    98.9 F (37.2 C)  TempSrc:    Oral  SpO2: 91% 92% 92% 100%  Weight:      Height:        Data Reviewed:  There are no new results to review at this time.  Family Communication:   Time spent: 15 minutes.  Author: Cristela Felt, MD 12/26/2021 12:06 PM  For on call review [email or web address].

## 2021-12-26 NOTE — Progress Notes (Signed)
NAME:  [name], MRN:  [id number], DOB:  17-Apr-1930, LOS: 3 ADMISSION DATE:  12/23/2021, CONSULTATION DATE:  12/24/2021 REFERRING MD:  Triad MD, CHIEF COMPLAINT:  hx of lung cancer with pleural effusion R > L    BRIEF  88 year old frail female who is not on oxygen at baseline.  Has multifocal non-small cell lung cancer on Tagrisso since 11/19/2021.  Most recently saw Dr. Julien Nordmann 12/19/2021 with plans for restaging CT scan in 1 month.  She has baseline hyponatremia sodium around 127-133.  On 12/19/2021 sodium was slightly down at 126.  She also has chronic anemia with a hemoglobin between 10-11 g%.  She saw Barrie Folk, PA-C for anemia on 12/23/2021.  Her anemia was stable but her sodium went down to 123. On this visit she complained ofr cough, left pleuritic pain dyspnea x 2 days with new onset hypoxemia 89% RA. WEight up 9# (not taken lasix for pedal edem x few months) .  KNown EF 45% on 12/24/21 since March 2023 (last normal 2020). CT chest 12/23/21 -> R > L pleural effusion . Rt side moderate. Currently on 3L [place]  Past Medical History:  LUng cancer Dr Julien Nordmann last visit 12/19/21 Multifocal non-small cell lung cancer, adenocarcinoma initially diagnosed as stage IIA (T2b, N0, M0) non-small cell lung cancer, adenocarcinoma with positive EGFR mutation in exon 21 (L858R) presented with right upper lobe lung mass diagnosed in March of 2015.   PRIOR THERAPY: Status post right upper lobectomy with wedge resection of the right middle lobe under the care of Dr. Servando Snare on 06/07/2013   CURRENT THERAPY: Tagrisso 80 mg p.o. daily started November 19, 2021.  Status post 4 weeks of treatment.   INTERVAL HISTORY:  feeling fine today Plan  - She is currently undergoing treatment with Tagrisso 80 mg p.o. daily started on November 19, 2021, status post 1 month of treatment. She has been tolerating the treatment well except for few blisters in the mouth and mild skin rash on the scalp. I recommended for  the patient to continue her current treatment with Tagrisso with the same dose. I will see her back for follow-up visit in 1 months for evaluation with repeat CT scan of the chest for restaging of her disease. For the hyponatremia and the anemia, we will continue to monitor her closely.   has a past medical history of Arthralgia of multiple joints, Arthritis, Cardiomyopathy ([place]), Chronic constipation, Chronic inflammatory arthritis, Dry eyes, GERD (gastroesophageal reflux disease), H/O discoid lupus erythematosus, Hiatal hernia, History of colon polyps, Hypothyroidism, Iron deficiency anemia, LBBB (left bundle branch block) (2010), Mitchell's disease (erythromelalgia) ([place]), Nocturia, Non-small cell cancer of right lung [facility]) (surgeon-- dr gerhardt/  oncologist-  dr Julien Nordmann--- per lov notes no recurrence/   11-18-2017 per pt denies any symptoms), OA (osteoarthritis), Osteoporosis, PONV (postoperative nausea and vomiting), Raynaud's phenomenon (1965), Renal insufficiency, Rheumatoid arthritis ([place]), Sciatica, Scoliosis, and Sjogren's syndrome ([place]).   has a past surgical history that includes Thoracic sympathetectomy (1965); Colonoscopy; Cardiovascular stress test (12/2008); Video bronchoscopy with endobronchial navigation (N/A, 05/04/2013); Video bronchoscopy (N/A, 06/07/2013); Video assisted thoracoscopy (vats)/wedge resection (Right, 06/07/2013); Thoracotomy (06/07/2013); Lymph node dissection (Right, 06/07/2013); Total hip arthroplasty (Right, 04/28/2014); transthoracic echocardiogram (12/11/2008); Total abdominal hysterectomy (1980's ); Appendectomy (1950s); Tonsillectomy (child); Anterior hip revision (Right, 11/27/2017); Anterior hip revision (Right, 01/24/2018); Femoral-popliteal Bypass Graft (Right, 04/10/2018); Patch angioplasty (Right, 04/10/2018); Esophagogastroduodenoscopy (egd) with propofol (N/A, 04/14/2018); biopsy (04/14/2018); Colonoscopy with propofol (N/A, 04/16/2018); biopsy (04/16/2018); Flexible  sigmoidoscopy (N/A, 06/17/2019);  Schlerotherapy (06/17/2019); and Hemostasis clip placement (06/17/2019).    Significant Hospital Events:  12/23/2021 - admit 12/24/21 - pccm consul;t  - RVP neg  - Blood culture 11/6   - MRSA pcr 11/66 -neg  - Flu PCR/Cvovid PCR 11/6 - neg   Interim History / Subjective:   No acute issues overnight  Objective   Blood pressure 130/62, pulse 84, temperature 97.8 F (36.6 C), temperature source Oral, resp. rate 20, height 5' (1.524 m), weight 51.1 kg, SpO2 92 %.        Intake/Output Summary (Last 24 hours) at 12/26/2021 1120 Last data filed at 12/26/2021 0834 Gross per 24 hour  Intake 201.62 ml  Output 900 ml  Net -698.38 ml   Filed Weights   12/24/21 1536 12/25/21 0500 12/26/21 0500  Weight: 51.3 kg 52.4 kg 51.1 kg    Examination: General appearance: 88 y.o., female, NAD, conversant  Eyes: anicteric sclerae; PERRL, tracking appropriately HENT: NCAT; MMM Lungs: diminished bl with faint crackles, with normal respiratory effort CV: RRR, soft systolic murmur  Extremities: Ntrace peripheral edema, warm Skin: Normal turgor and texture; no rash Neuro: grossly nonfocal   Labs/imaging WBC wnl  CXR without ptx post [facility] Problem list   x  Assessment & Plan:    Acute hypoxic respiratory failure, improving Acute on chronic systolic heart failure, severe MR with pulmonary edema Right pleural effusion s/p thora 11/9, likely transudate awaiting pleural LDH, cytology, cx Possibility as well of component of CAP vs organizing pneumonia superior segment RLL which may be related to tagrisso.  P:   - O2 for pulse ox > 92%, walking oximetry prior to discharge - Diuresis per cardiology - ABX per primary - PT/mobilize - If still hypoxic and increased WOB near discharge then could consider steroids for pneumonitis but would hold off for now  Prolonged QTc - new at admit. New since sept 2023 - ctm, holding tagrisso  Lung  cancer  - holding tagrisso per Dr. Julien Nordmann  Will sign off but glad to be reinvolved as condition changes  Best practice (daily eval):  Per triad   [facility]   11:20 AM 12/26/2021   12/26/2021 11:20 AM    LABS

## 2021-12-26 NOTE — Progress Notes (Signed)
Report called to Nash Dimmer RN. All questions answered at this time. All patient belongings and paper chart transported with pt. Patient was transferred upstairs, in the bed, on tele by this RN. Handoff completed.

## 2021-12-26 NOTE — Progress Notes (Signed)
Rounding Note    Patient Name: [name] Date of Encounter: 12/26/2021  [place] Cardiologist: Kirk Ruths, MD   Subjective   No acute events overnight. Didn't sleep well as she had frequent interruptions for labs, etc overnight. Reviewed my discussion with Dr. Burt Knack regarding her mitral valve. Also reviewed Dr. Worthy Flank note re: Newman Nip. See summary below.  Inpatient Medications    Scheduled Meds:  calcium-vitamin D  1 tablet Oral Daily   carvedilol  3.125 mg Oral BID WC   Chlorhexidine Gluconate Cloth  6 each Topical Daily   docusate sodium  100 mg Oral BID   enoxaparin (LOVENOX) injection  30 mg Subcutaneous Q24H   furosemide  40 mg Intravenous Daily   gabapentin  300 mg Oral BID   gabapentin  600 mg Oral QHS   levothyroxine  75 mcg Oral QAC breakfast   osimertinib mesylate  80 mg Oral Daily   pantoprazole  40 mg Oral Daily   sodium chloride  1 g Oral Daily   Continuous Infusions:  sodium chloride Stopped (12/26/21 0744)   cefTRIAXone (ROCEPHIN)  IV Stopped (12/25/21 1744)   PRN Meds: sodium chloride, albuterol, methocarbamol, mouth rinse, traMADol   Vital Signs    Vitals:   12/26/21 0738 12/26/21 0800 12/26/21 0810 12/26/21 0814  BP:  130/62 130/62   Pulse:  92 (!) 102 93  Resp:  (!) 31  (!) 25  Temp: 97.8 F (36.6 C)     TempSrc: Oral     SpO2: 97% 94%  94%  Weight:      Height:        Intake/Output Summary (Last 24 hours) at 12/26/2021 0936 Last data filed at 12/26/2021 1696 Gross per 24 hour  Intake 201.62 ml  Output 900 ml  Net -698.38 ml      12/26/2021    5:00 AM 12/25/2021    5:00 AM 12/24/2021    3:36 PM  Last 3 Weights  Weight (lbs) 112 lb 10.5 oz 115 lb 8.3 oz 113 lb 1.5 oz  Weight (kg) 51.1 kg 52.4 kg 51.3 kg      Telemetry    SR with occasional PVC, brief episode of SVT (less than 10 beats) - Personally Reviewed  ECG    11/9 NSR, LBBB at 77 bpm - Personally Reviewed  Physical Exam   GEN: Frail  elderly woman in good spirits, in no acute distress, [place] in place NECK: No JVD visualized at 45 degrees today CARDIAC: regular rhythm, normal S1 and S2, no rubs or gallops. 2/6 holosystolic murmur, slightly quieter than yesterday. VASCULAR: Radial pulses 2+ bilaterally.  RESPIRATORY:  Clear in upper fields, slightly diminished with fine crackles at bilateral bases ABDOMEN: Soft, non-tender, non-distended MUSCULOSKELETAL:  Moves all 4 limbs independently SKIN: Warm and dry, no edema NEUROLOGIC:  No focal neuro deficits noted. PSYCHIATRIC:  Normal affect    Labs    High Sensitivity Troponin:   Recent Labs  Lab 12/25/21 0229  TROPONINIHS 10     Chemistry Recent Labs  Lab 12/19/21 1041 12/23/21 1458 12/24/21 0458 12/25/21 0229 12/26/21 0251  NA 126* 121* 123* 123* 127*  K 5.1 4.8 4.7 4.0 3.4*  CL 93* 87* 92* 88* 89*  CO2 27 28 26 23 28   GLUCOSE 87 124* 90 88 85  BUN 26* 19 20 19 21   CREATININE 1.10* 1.03* 1.15* 1.17* 1.16*  CALCIUM 9.2 9.3 8.1* 8.0* 8.0*  MG  --   --   --  2.0 1.8  PROT 7.2 7.1  --   --  5.9*  ALBUMIN 4.2 4.1  --   --  2.8*  AST 29 27  --   --  25  ALT 13 12  --   --  15  ALKPHOS 31* 34*  --   --  39  BILITOT 0.3 0.4  --   --  0.6  GFRNONAA 48* 52* 46* 45* 45*  ANIONGAP 6 6 5 12 10     Lipids No results for input(s): "CHOL", "TRIG", "HDL", "LABVLDL", "LDLCALC", "CHOLHDL" in the last 168 hours.  Hematology Recent Labs  Lab 12/24/21 0458 12/25/21 0229 12/26/21 0251  WBC 6.7 7.9 6.2  RBC 3.03* 3.25* 3.09*  HGB 9.1* 9.5* 9.2*  HCT 27.6* 29.5* 27.9*  MCV 91.1 90.8 90.3  MCH 30.0 29.2 29.8  MCHC 33.0 32.2 33.0  RDW 13.0 13.1 13.1  PLT 148* 162 171   Thyroid No results for input(s): "TSH", "FREET4" in the last 168 hours.  BNP Recent Labs  Lab 12/23/21 1648  BNP 874.2*    DDimer No results for input(s): "DDIMER" in the last 168 hours.   Radiology    US THORACENTESIS ASP PLEURAL SPACE W/IMG GUIDE  Result Date: 12/25/2021 INDICATION: Patient  with history of non-small cell lung cancer and prior right upper lobectomy and right middle lobe wedge resection, dyspnea, CHF, bilateral pleural effusions; request received for diagnostic and therapeutic right thoracentesis. EXAM: ULTRASOUND GUIDED DIAGNOSTIC AND THERAPEUTIC RIGHT THORACENTESIS MEDICATIONS: 8 mL 1% lidocaine COMPLICATIONS: None immediate. PROCEDURE: An ultrasound guided thoracentesis was thoroughly discussed with the patient and questions answered. The benefits, risks, alternatives and complications were also discussed. The patient understands and wishes to proceed with the procedure. Written consent was obtained. Ultrasound was performed to localize and mark an adequate pocket of fluid in the right chest. The area was then prepped and draped in the normal sterile fashion. 1% Lidocaine was used for local anesthesia. Under ultrasound guidance a 6 Fr Safe-T-Centesis catheter was introduced. Thoracentesis was performed. The catheter was removed and a dressing applied. FINDINGS: A total of approximately 800 cc of amber fluid was removed. Samples were sent to the laboratory as requested by the clinical team. IMPRESSION: Successful ultrasound guided diagnostic and therapeutic right thoracentesis yielding 800 cc of pleural fluid. Read by: Rowe Robert, PA-C Electronically Signed   By: Ruthann Cancer M.D.   On: 12/25/2021 10:42   DG Chest 1 View  Result Date: 12/25/2021 CLINICAL DATA:  Post right thoracentesis EXAM: PORTABLE CHEST 1 VIEW COMPARISON:  12/23/2021 FINDINGS: There has been interval reduction in right-sided pleural effusion when compare with the prior exam. Some persistent scarring is noted as well as fullness in the right hilar region stable from the prior exam. Postsurgical changes are again seen. No pneumothorax is noted. Scarring in the left base is again identified. IMPRESSION: No pneumothorax following thoracentesis. Electronically Signed   By: Inez Catalina M.D.   On: 12/25/2021 10:12     Cardiac Studies   Echocardiogram 11/08/18/2021: Impressions: 1. Left ventricular ejection fraction, by estimation, is 40 to 45%. Left  ventricular ejection fraction by 3D volume is 45 %. The left ventricle has  mildly decreased function. The left ventricle demonstrates global  hypokinesis. Left ventricular diastolic   parameters are consistent with Grade II diastolic dysfunction  (pseudonormalization). Elevated left atrial pressure.   2. Right ventricular systolic function is normal. The right ventricular  size is normal. There is normal pulmonary artery systolic pressure.  The  estimated right ventricular systolic pressure is 62.1 mmHg.   3. Left atrial size was moderately dilated.   4. Right atrial size was mildly dilated.   5. Large pleural effusion in the left lateral region.   6. 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 Severe  mitral valve regurgitation due to central malcoaptation with flow reversal  in the pulmonary veins and triangular "v-wave cutoff" spectral  configuration.   7. The aortic valve is tricuspid. There is mild thickening of the aortic  valve. Aortic valve regurgitation is trivial. Aortic valve sclerosis is  present, with no evidence of aortic valve stenosis.   8. The inferior vena cava is normal in size with <50% respiratory  variability, suggesting right atrial pressure of 8 mmHg.    Comparison(s): A prior study was performed on 04/17/2021. Prior images  reviewed side by side. The left ventricular function is unchanged. There  is marked worsening of the mitral insufficiency. The leaflets are  restricted and there is central  malcoaptation. LBBB is present but there does not appear to be severe LV  septal-lateral dyssynchrony.   Patient Profile     88 y.o. female with a history of dilated cardiomyopathy/ chronic combined CHF with EF of 40-45%, palpitations with rare PVCs and brief episode of SVT noted on monitor in 2021, LBBB, chronic  inflammatory arthritis followed by Rheumatology, chronic anemia, and non-small cell lung cancer who is being seen for the evaluation of acute on chronic CHF.  Assessment & Plan    Acute hypoxic respiratory failure Acute on Chronic Combined CHF  Dilated Cardiomyopathy Mitral regurgitation -elevated BNP with bilateral pleural effusions -EF unchanged on echo, 40-45% -MR noted to now be severe. There is diffuse malcoaptation, likely 2/2 annular dilation. I reviewed the images with Dr. Burt Knack, who performs our mitraclip studies. He does think mitraclip may be possible, but it will likely require more than one clip. He would need a TEE for further evaluation before being able to make a definitive plan -I discussed this with the patient. I also discuss Dr. Worthy Flank note that the Newman Nip may be at least partially responsible for her heart failure. She had reduced EF previously but the mitral annulus was not as dilated. -we discussed options. After shared decision making, she would like to re-evaluate once she has been off the tagrisso for a time to see if her symptoms/MR improve. If her symptoms cannot be managed with medications, she would then consider a TEE and evaluation for mitra clip. Caveat would be we would need a clearer understanding of her predicted life expectancy from her cancer to make sure that she would have significant benefit from mitra clip. -continue diuresis, afterload reduction as able. -peak weight 52.4 kg, current weight 51.1 kg. I/O not accurate   Acute on chronic hyponatremia Chronic kidney disease, stage 3a -monitor renal function and sodium with diuresis. Na up slightly to 127 today, Cr stable, K 3.4   Nonsmall cell lung cancer, adenocarcinoma, stage 2a s/p right upper lobectomy and right middle lobe wedge resection in 2015 -followed as an outpatient. Planned for outpatient scan to monitor. Most recent scan noted progression of multifocal disease -currently on Tagrisso,  though this is on hold as above given concern for cardiac side effects  For questions or updates, please contact [place] Please consult [email or web address] for contact info under     Signed, Buford Dresser, MD  12/26/2021, 9:36 AM

## 2021-12-26 NOTE — Progress Notes (Signed)
[facility] ADULT ICU REPLACEMENT PROTOCOL   The patient does apply for the [facility] Adult ICU Electrolyte Replacment Protocol based on the criteria listed below:   1.Exclusion criteria: TCTS, ECMO, Dialysis, and Myasthenia Gravis patients 2. Is GFR >/= 30 ml/min? Yes.    Patient's GFR today is 45 3. Is SCr </= 2? Yes.   Patient's SCr is 1.16 mg/dL 4. Did SCr increase >/= 0.5 in 24 hours? No. 5.Pt's weight >40kg  Yes.   6. Abnormal electrolyte(s): K+ 3.4, mag 1.8  7. Electrolytes replaced per protocol 8.  Call MD STAT for K+ </= 2.5, Phos </= 1, or Mag </= 1 Physician:  n/a  [name] 12/26/2021 5:47 AM

## 2021-12-27 DIAGNOSIS — J9601 Acute respiratory failure with hypoxia: Secondary | ICD-10-CM | POA: Diagnosis not present

## 2021-12-27 DIAGNOSIS — I340 Nonrheumatic mitral (valve) insufficiency: Secondary | ICD-10-CM | POA: Diagnosis not present

## 2021-12-27 DIAGNOSIS — I5043 Acute on chronic combined systolic (congestive) and diastolic (congestive) heart failure: Secondary | ICD-10-CM | POA: Diagnosis not present

## 2021-12-27 DIAGNOSIS — E871 Hypo-osmolality and hyponatremia: Secondary | ICD-10-CM | POA: Diagnosis not present

## 2021-12-27 LAB — CBC
HCT: 29.1 % — ABNORMAL LOW (ref 36.0–46.0)
Hemoglobin: 9.4 g/dL — ABNORMAL LOW (ref 12.0–15.0)
MCH: 29.5 pg (ref 26.0–34.0)
MCHC: 32.3 g/dL (ref 30.0–36.0)
MCV: 91.2 fL (ref 80.0–100.0)
Platelets: 199 10*3/uL (ref 150–400)
RBC: 3.19 MIL/uL — ABNORMAL LOW (ref 3.87–5.11)
RDW: 13.1 % (ref 11.5–15.5)
WBC: 5.6 10*3/uL (ref 4.0–10.5)
nRBC: 0 % (ref 0.0–0.2)

## 2021-12-27 LAB — BASIC METABOLIC PANEL
Anion gap: 7 (ref 5–15)
BUN: 22 mg/dL (ref 8–23)
CO2: 28 mmol/L (ref 22–32)
Calcium: 8.2 mg/dL — ABNORMAL LOW (ref 8.9–10.3)
Chloride: 93 mmol/L — ABNORMAL LOW (ref 98–111)
Creatinine, Ser: 0.99 mg/dL (ref 0.44–1.00)
GFR, Estimated: 55 mL/min — ABNORMAL LOW (ref >60)
Glucose, Bld: 84 mg/dL (ref 70–99)
Potassium: 3.8 mmol/L (ref 3.5–5.1)
Sodium: 128 mmol/L — ABNORMAL LOW (ref 135–145)

## 2021-12-27 NOTE — TOC Progression Note (Signed)
Transition of Care [facility]) - Progression Note    Patient Details  Name: [name] MRN: [id number] Date of Birth: 1930/11/12  Transition of Care [facility]) CM/SW Contact  [name], RN Phone Number: 12/27/2021, 1:50 PM  Clinical Narrative:    [facility] consult for HHPT; met with pt and dtr [name] [phone]) in room;l they are agreeable to HHPT and do not have an agency preference.  - [id number] - spoke with Claiborne Billings at [facility]; she says they can service the pt; contact info added to follow up provider section of d/c instructions; pt's dtr [name] notified.    Expected Discharge Plan: [place] Barriers to Discharge: Continued Medical Work up  Expected Discharge Plan and Services Expected Discharge Plan: [place]                                               Social Determinants of Health (SDOH) Interventions    Readmission Risk Interventions     No data to display

## 2021-12-27 NOTE — Progress Notes (Signed)
Consultation Progress Note   Patient: [name]:[id number] DOB: 1930-09-17 DOA: 12/23/2021 DOS: the patient was seen and examined on 12/27/2021 Primary service: Wesly Whisenant, Manfred Shirts, MD  Brief hospital course: [name] is a 88 y.o. female with medical history significant of multifocal non-small cell lung cancer, dilated cardiomyopathy, combined systolic and diastolic HF, HTN, seronegative RA,osteoporosis,sicca syndrome, raynaud'sdisease, anemia of chronic disease who presents with hypoxia and shortness of breath.    She has noticed increase shortness of breath with mild cough for the past 3 days. No fever. Trace edema and has not taken Lasix in the past 3 days. Had nausea but no vomiting. Occasional diarrhea due to her chemotherapy. Has good appetite. Pt is independent and lives by herself with daughter nearby.   Pt had follow up with oncology today and was noted to be hypoxic to 84% on RA with wheezing and mild respiratory distress. CXR showing right basilar opacity concerning for pneumonia vs atelectasis and pleural effusion. Also noted to have acute on chronic LE edema with weight. She was advised to present to ED.    In the ED, she was afebrile, normotensive and ultimately required Bipap.  WBC of 6.3, hemoglobin of 10.8.  Lactate within normal limits at 1.2. Sodium of 121, K of 4.8, chloride of 87, creatinine 1.03 BNP of 874   Negative flu/COVID PCR   CTA chest shows no pulmonary embolism but there is new moderate right pleural effusion, new small left pleural effusion.  Interstitial edema and scattered groundglass density opacities.   She was given IV Rocephin, azithromycin and 20 mg of IV Lasix in the ED.  Hospitalist then consulted for admission.  Assessment and Plan:  Principal Problem:   Acute respiratory failure with hypoxia (HCC) Active Problems:   Lung cancer, Right upper lobe   Rheumatoid arthritis involving multiple joints (HCC)   Hyponatremia   Anemia of  chronic disease   HTN (hypertension)   Acute on chronic combined systolic and diastolic CHF (congestive heart failure) (HCC)   Acute respiratory failure (HCC)      Acute respiratory failure with hypoxia (HCC) Secondary to bilateral pleural effusion from acute on chronic combined CHF and possible pneumonia 11/8 and 11/9-Status  post thoracentesis yielding 800 cc of amber fluid,likely transudative,  she is currently on  1L of nasal cannula.   Patient and daughter state that her breathing has significantly improved. Remains on diuresis and antibiotics. -BiPAP as needed -DuoNeb bronchodilators   Bilateral pleural effusion -Status postthoracentesis,Fluid appears transudative.   Acute on chronic combined systolic and diastolic CHF (congestive heart failure) ([place]) Last echocardiogram on 04/2021 with EF of 40 to 45% with grade 1 diastolic dysfunction.  2D echo reviewed: Left LV EJF 40 to 45%.mildly decreased function. The left ventricle demonstrates global  hypokinesis. Left ventricular diastolic  parameters are consistent with Grade II diastolic dysfunction Mitral regurgitation -Cardiology plans to diurese to obtain euvolemic state and consider limited echo to reassess MR.  -Per Cardiology if BP allows consider adding ARB and/or MRA.  -Holding SGLT2i, due to recent and frequent UTIs,   -IV Lasix 40mg  daily -strict intake and output -daily wts -Cardiology team consulted, appreciate input   HTN (hypertension) Stable. On IV Lasix.    Anemia of chronic disease Stable Hgb at baseline of 10.8    Hyponatremia 127 (10/17)- 126 (11/2)- 121-123 -->128 -Multifactorial, -Volume overload, Lung cancer, diarrhea. -On  supplement sodium p.o. 1 g daily We will monitor closely likely exacerbated by lung CA  rheumatoid arthritis involving multiple joints (HCC) On IV Orencia followed by rheumatology--hold for now   Nonsmall cell lung cancer, adenocarcinoma, stage 2a  -s/p right upper lobectomy  and right middle lobe wedge resection in 2015  multifocal non-small cell lung cancer -follows with Dr. Julien Nordmann  -On Tagrisso although concerned about cardiotoxicity with current CHF exacerbation Discussed with Dr. Earlie Server okay to withhold for now        [facility] will continue to follow the patient.  Subjective: Feels better today, She is on 1 L of oxyge  Physical Exam: GEN: Well nourished, well developed in no acute distress NECK: Supple CARDIAC: regular rhythm, normal S1 and S2, no rubs or gallops. 8-0/3 holosystolic murmur, at apex VASCULAR: Radial pulses 2+ bilaterally.  RESPIRATORY:  Clear in upper fields, diminished breath sounds bilaterally  ABDOMEN: Soft, non-tender, non-distended MUSCULOSKELETAL:  Moves all 4 limbs independently SKIN: Warm and dry, no edema NEUROLOGIC:  No focal neuro deficits noted. PSYCHIATRIC:  Normal affect   Vitals:   12/26/21 2055 12/27/21 0018 12/27/21 0459 12/27/21 0500  BP: 133/71 106/67 128/71   Pulse: 65 83 82   Resp: 18 20 18    Temp: (!) 97.3 F (36.3 C) 98 F (36.7 C) 98 F (36.7 C)   TempSrc: Oral Oral Oral   SpO2: 91% 97% 99%   Weight:    51.5 kg  Height:        Data Reviewed:  There are no new results to review at this time.  Family Communication: Daughter at bedside  Time spent: 15 minutes.  Author: Cristela Felt, MD 12/27/2021 11:11 AM  For on call review [email or web address].

## 2021-12-27 NOTE — Care Management Important Message (Signed)
Important Message  Patient Details IM Letter given. Name: [name] MRN: [id number] Date of Birth: 1930/08/06   Medicare Important Message Given:  Yes     Kerin Salen 12/27/2021, 2:09 PM

## 2021-12-27 NOTE — Progress Notes (Signed)
Rounding Note    Patient Name: [name] Date of Encounter: 12/27/2021  [facility] Cardiologist: Kirk Ruths, MD   Subjective   Feeling better today, has used bedside commode this AM. Daughter at bedside. Reviewed mitral valve, cardiomyopathy extensively, see below.  Inpatient Medications    Scheduled Meds:  acidophilus  1 capsule Oral Daily   calcium-vitamin D  1 tablet Oral Daily   carvedilol  3.125 mg Oral BID WC   Chlorhexidine Gluconate Cloth  6 each Topical Daily   docusate sodium  100 mg Oral BID   enoxaparin (LOVENOX) injection  30 mg Subcutaneous Q24H   furosemide  40 mg Intravenous Daily   gabapentin  300 mg Oral BID   gabapentin  600 mg Oral QHS   levothyroxine  75 mcg Oral QAC breakfast   pantoprazole  40 mg Oral Daily   sodium chloride  1 g Oral Daily   Continuous Infusions:  sodium chloride Stopped (12/26/21 0744)   cefTRIAXone (ROCEPHIN)  IV 1 g (12/26/21 1636)   PRN Meds: sodium chloride, albuterol, methocarbamol, mouth rinse, traMADol   Vital Signs    Vitals:   12/26/21 2055 12/27/21 0018 12/27/21 0459 12/27/21 0500  BP: 133/71 106/67 128/71   Pulse: 65 83 82   Resp: 18 20 18    Temp: (!) 97.3 F (36.3 C) 98 F (36.7 C) 98 F (36.7 C)   TempSrc: Oral Oral Oral   SpO2: 91% 97% 99%   Weight:    51.5 kg  Height:        Intake/Output Summary (Last 24 hours) at 12/27/2021 1052 Last data filed at 12/26/2021 1800 Gross per 24 hour  Intake 120 ml  Output 400 ml  Net -280 ml      12/27/2021    5:00 AM 12/26/2021    5:00 AM 12/25/2021    5:00 AM  Last 3 Weights  Weight (lbs) 113 lb 9.6 oz 112 lb 10.5 oz 115 lb 8.3 oz  Weight (kg) 51.529 kg 51.1 kg 52.4 kg      Telemetry    SR with rare sinus tach - Personally Reviewed  ECG    11/9 NSR, LBBB at 77 bpm - Personally Reviewed  Physical Exam   GEN: frail elderly woman in no acute distress, [place] in place NECK: No JVD CARDIAC: regular rhythm, normal S1 and S2, no  rubs or gallops. 2/6 holosystolic murmur, quieter than on admission. VASCULAR: Radial pulses 2+ bilaterally.  RESPIRATORY:  Clear to auscultation in upper fields, fine crackles at bases ABDOMEN: Soft, non-tender, non-distended MUSCULOSKELETAL:  Moves all 4 limbs independently SKIN: Warm and dry, no edema NEUROLOGIC:  No focal neuro deficits noted. PSYCHIATRIC:  Normal affect    Labs    High Sensitivity Troponin:   Recent Labs  Lab 12/25/21 0229  TROPONINIHS 10     Chemistry Recent Labs  Lab 12/23/21 1458 12/24/21 0458 12/25/21 0229 12/26/21 0251 12/27/21 0350  NA 121*   < > 123* 127* 128*  K 4.8   < > 4.0 3.4* 3.8  CL 87*   < > 88* 89* 93*  CO2 28   < > 23 28 28   GLUCOSE 124*   < > 88 85 84  BUN 19   < > 19 21 22   CREATININE 1.03*   < > 1.17* 1.16* 0.99  CALCIUM 9.3   < > 8.0* 8.0* 8.2*  MG  --   --  2.0 1.8  --  PROT 7.1  --   --  5.9*  --   ALBUMIN 4.1  --   --  2.8*  --   AST 27  --   --  25  --   ALT 12  --   --  15  --   ALKPHOS 34*  --   --  39  --   BILITOT 0.4  --   --  0.6  --   GFRNONAA 52*   < > 45* 45* 55*  ANIONGAP 6   < > 12 10 7    < > = values in this interval not displayed.    Lipids No results for input(s): "CHOL", "TRIG", "HDL", "LABVLDL", "LDLCALC", "CHOLHDL" in the last 168 hours.  Hematology Recent Labs  Lab 12/25/21 0229 12/26/21 0251 12/27/21 0350  WBC 7.9 6.2 5.6  RBC 3.25* 3.09* 3.19*  HGB 9.5* 9.2* 9.4*  HCT 29.5* 27.9* 29.1*  MCV 90.8 90.3 91.2  MCH 29.2 29.8 29.5  MCHC 32.2 33.0 32.3  RDW 13.1 13.1 13.1  PLT 162 171 199   Thyroid No results for input(s): "TSH", "FREET4" in the last 168 hours.  BNP Recent Labs  Lab 12/23/21 1648  BNP 874.2*    DDimer No results for input(s): "DDIMER" in the last 168 hours.   Radiology    No results found.  Cardiac Studies   Echocardiogram 11/08/18/2021: Impressions: 1. Left ventricular ejection fraction, by estimation, is 40 to 45%. Left  ventricular ejection fraction by 3D  volume is 45 %. The left ventricle has  mildly decreased function. The left ventricle demonstrates global  hypokinesis. Left ventricular diastolic   parameters are consistent with Grade II diastolic dysfunction  (pseudonormalization). Elevated left atrial pressure.   2. Right ventricular systolic function is normal. The right ventricular  size is normal. There is normal pulmonary artery systolic pressure. The  estimated right ventricular systolic pressure is 20.2 mmHg.   3. Left atrial size was moderately dilated.   4. Right atrial size was mildly dilated.   5. Large pleural effusion in the left lateral region.   6. 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 Severe  mitral valve regurgitation due to central malcoaptation with flow reversal  in the pulmonary veins and triangular "v-wave cutoff" spectral  configuration.   7. The aortic valve is tricuspid. There is mild thickening of the aortic  valve. Aortic valve regurgitation is trivial. Aortic valve sclerosis is  present, with no evidence of aortic valve stenosis.   8. The inferior vena cava is normal in size with <50% respiratory  variability, suggesting right atrial pressure of 8 mmHg.    Comparison(s): A prior study was performed on 04/17/2021. Prior images  reviewed side by side. The left ventricular function is unchanged. There  is marked worsening of the mitral insufficiency. The leaflets are  restricted and there is central  malcoaptation. LBBB is present but there does not appear to be severe LV  septal-lateral dyssynchrony.   Patient Profile     88 y.o. 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  Assessment & Plan     Acute hypoxic respiratory failure Acute on Chronic Combined CHF  Dilated Cardiomyopathy Mitral regurgitation -elevated BNP with bilateral  pleural effusions -EF unchanged on echo, 40-45% -MR noted to now be severe. See full comments on 11/9 note re: mitraclip, Tagrisso. Summary is that she would like to re-evaluate the MR once she has been off the tagrisso for a time to see if her symptoms/MR improve. If her symptoms cannot be managed with medications, she would then consider a TEE and evaluation for mitra clip. Caveat would be we would need a clearer understanding of her predicted life expectancy from her cancer to make sure that she would have significant benefit from mitra clip. -continue diuresis, afterload reduction as able. -peak weight 52.4 kg, current weight 51.5 kg. I/O not accurate -continue IV lasix 40 mg daily for today, anticipate change to oral diuretic in near future -blood pressure ranges low to normal. Continue low dose carvedilol. Once nearing euvolemia, if BP allows consider adding ARB and/or MRA.  -considered SGLT2i, but has recent and frequent UTIs, so will not start   Acute on chronic hyponatremia Chronic kidney disease, stage 3a -monitor renal function and sodium with diuresis. Na up slightly to 128 today, Cr improved, K 3.8   Nonsmall cell lung cancer, adenocarcinoma, stage 2a s/p right upper lobectomy and right middle lobe wedge resection in 2015 -followed as an outpatient. Planned for outpatient scan to monitor. Most recent scan noted progression of multifocal disease -was on Tagrisso, though this is on hold as above given concern for cardiac side effects  Time Spent with Patient: I have spent a total of 60 minutes in patient care, including reviewing hospital notes, telemetry, EKGs, labs; discussing with the team; examining the patient; and establishing an assessment and plan that was discussed with the patient.  > 50% of time was spent in direct patient care.   Extensive bedside conversation with patient and her daughter.  For questions or updates, please contact [place] Please consult [email or web address] for contact info under     Signed, Buford Dresser, MD  12/27/2021, 10:52 AM

## 2021-12-27 NOTE — Plan of Care (Signed)
  Problem: Education: Goal: Ability to demonstrate management of disease process will improve Outcome: Progressing   Problem: Activity: Goal: Capacity to carry out activities will improve Outcome: Progressing   Problem: Education: Goal: Knowledge of General Education information will improve Description: Including pain rating scale, medication(s)/side effects and non-pharmacologic comfort measures Outcome: Progressing   Problem: Health Behavior/Discharge Planning: Goal: Ability to manage health-related needs will improve Outcome: Progressing   Problem: Clinical Measurements: Goal: Ability to maintain clinical measurements within normal limits will improve Outcome: Progressing

## 2021-12-27 NOTE — Progress Notes (Signed)
Physical Therapy Treatment Patient Details Name: GWYN HIERONYMUS MRN: [id number] DOB: 14-Apr-1930 Today's Date: 12/27/2021   History of Present Illness ONESHA KREBBS is a 88 y.o. female with medical history significant of multifocal non-small cell lung cancer, dilated cardiomyopathy, combined systolic and diastolic HF, HTN, seronegative RA,osteoporosis,sicca syndrome, raynaud'sdisease, right hip surgeries, anemia of chronic disease who presents 12/23/21  with hypoxia and shortness of breath.CXR showing right basilar opacity concerning for pneumonia vs atelectasis and pleural effusion. Also noted to have acute on chronic LE edema    PT Comments    Pt assisted to [facility] due to urgency and then ambulated in hallway.  Pt requiring supplemental oxygen. Pt on 1L O2 [place] in room and SpO2 dropped to 86% while ambulating so increased to 2L.  SpO2 96% on 2L O2 [place] while ambulating.  Pt agreeable to remain OOB in recliner end of session.  Continue to recommend HHPT upon d/c.   Recommendations for follow up therapy are one component of a multi-disciplinary discharge planning process, led by the attending physician.  Recommendations may be updated based on patient status, additional functional criteria and insurance authorization.  Follow Up Recommendations  Home health PT     Assistance Recommended at Discharge Frequent or constant Supervision/Assistance  Patient can return home with the following A little help with walking and/or transfers;A little help with bathing/dressing/bathroom;Assistance with cooking/housework;Assist for transportation;Help with stairs or ramp for entrance   Equipment Recommendations  None recommended by PT    Recommendations for Other Services       Precautions / Restrictions Precautions Precautions: Fall Precaution Comments: monitor sats     Mobility  Bed Mobility               General bed mobility comments: sitting EOB on arrival with therapy tech     Transfers Overall transfer level: Needs assistance Equipment used: Rolling walker (2 wheels) Transfers: Sit to/from Stand, Bed to chair/wheelchair/BSC Sit to Stand: Min guard Stand pivot transfers: Min guard         General transfer comment: min/guard for safety, pt hurrying due to urgency to use BSC, cues for hand placement    Ambulation/Gait Ambulation/Gait assistance: Min guard Gait Distance (Feet): 80 Feet Assistive device: Rolling walker (2 wheels) Gait Pattern/deviations: Step-through pattern, Decreased stride length Gait velocity: decr     General Gait Details: cues for RW positioning, breathing, no rest break required today   Stairs             Wheelchair Mobility    Modified Rankin (Stroke Patients Only)       Balance                                            Cognition Arousal/Alertness: Awake/alert Behavior During Therapy: WFL for tasks assessed/performed Overall Cognitive Status: Within Functional Limits for tasks assessed                                          Exercises      General Comments        Pertinent Vitals/Pain Pain Assessment Pain Assessment: No/denies pain    Home Living  Prior Function            PT Goals (current goals can now be found in the care plan section) Progress towards PT goals: Progressing toward goals    Frequency    Min 3X/week      PT Plan Current plan remains appropriate    Co-evaluation              AM-PAC PT "6 Clicks" Mobility   Outcome Measure  Help needed turning from your back to your side while in a flat bed without using bedrails?: A Little Help needed moving from lying on your back to sitting on the side of a flat bed without using bedrails?: A Little Help needed moving to and from a bed to a chair (including a wheelchair)?: A Little Help needed standing up from a chair using your arms (e.g.,  wheelchair or bedside chair)?: A Little Help needed to walk in hospital room?: A Little Help needed climbing 3-5 steps with a railing? : A Lot 6 Click Score: 17    End of Session Equipment Utilized During Treatment: Gait belt;Oxygen Activity Tolerance: Patient tolerated treatment well Patient left: in chair;with call bell/phone within reach;with family/visitor present;with chair alarm set Nurse Communication: Mobility status PT Visit Diagnosis: Difficulty in walking, not elsewhere classified (R26.2)     Time: 1044-1100 PT Time Calculation (min) (ACUTE ONLY): 16 min  Charges:  $Gait Training: 8-22 mins                    Jannette Spanner PT, DPT Physical Therapist Acute Rehabilitation Services Preferred contact method: Secure Chat Weekend Pager Only: [phone] Office: [place] 12/27/2021, 12:58 PM

## 2021-12-28 DIAGNOSIS — J9601 Acute respiratory failure with hypoxia: Secondary | ICD-10-CM | POA: Diagnosis not present

## 2021-12-28 LAB — CBC
HCT: 30.7 % — ABNORMAL LOW (ref 36.0–46.0)
Hemoglobin: 10 g/dL — ABNORMAL LOW (ref 12.0–15.0)
MCH: 29.8 pg (ref 26.0–34.0)
MCHC: 32.6 g/dL (ref 30.0–36.0)
MCV: 91.4 fL (ref 80.0–100.0)
Platelets: 227 10*3/uL (ref 150–400)
RBC: 3.36 MIL/uL — ABNORMAL LOW (ref 3.87–5.11)
RDW: 13.2 % (ref 11.5–15.5)
WBC: 5.2 10*3/uL (ref 4.0–10.5)
nRBC: 0 % (ref 0.0–0.2)

## 2021-12-28 LAB — BASIC METABOLIC PANEL
Anion gap: 10 (ref 5–15)
BUN: 23 mg/dL (ref 8–23)
CO2: 28 mmol/L (ref 22–32)
Calcium: 8.5 mg/dL — ABNORMAL LOW (ref 8.9–10.3)
Chloride: 93 mmol/L — ABNORMAL LOW (ref 98–111)
Creatinine, Ser: 0.89 mg/dL (ref 0.44–1.00)
GFR, Estimated: >60 mL/min (ref >60)
Glucose, Bld: 88 mg/dL (ref 70–99)
Potassium: 3.9 mmol/L (ref 3.5–5.1)
Sodium: 131 mmol/L — ABNORMAL LOW (ref 135–145)

## 2021-12-28 LAB — CULTURE, BLOOD (ROUTINE X 2)
Culture: NO GROWTH
Culture: NO GROWTH
Special Requests: ADEQUATE

## 2021-12-28 MED ORDER — LOPERAMIDE HCL 2 MG PO CAPS
2.0000 mg | ORAL_CAPSULE | ORAL | Status: DC | PRN
  Administered 2021-12-28 – 2021-12-29 (×2): 2 mg via ORAL
  Filled 2021-12-28 (×2): qty 1

## 2021-12-28 NOTE — Progress Notes (Signed)
Consultation Progress Note   Patient: [name] WJX:[id number] DOB: 07-16-1930 DOA: 12/23/2021 DOS: the patient was seen and examined on 12/28/2021 Primary service: Raven Nimrod, MD  Brief hospital course: [name] is a 88 y.o. female with medical history significant of multifocal non-small cell lung cancer, dilated cardiomyopathy, combined systolic and diastolic HF, HTN, seronegative RA,osteoporosis,sicca syndrome, raynaud'sdisease, anemia of chronic disease who presents with hypoxia and shortness of breath.    She has noticed increase shortness of breath with mild cough for the past 3 days. No fever. Trace edema and has not taken Lasix in the past 3 days. Had nausea but no vomiting. Occasional diarrhea due to her chemotherapy. Has good appetite. Pt is independent and lives by herself with daughter nearby.   Pt had follow up with oncology today and was noted to be hypoxic to 84% on RA with wheezing and mild respiratory distress. CXR showing right basilar opacity concerning for pneumonia vs atelectasis and pleural effusion. Also noted to have acute on chronic LE edema with weight. She was advised to present to ED.    In the ED, she was afebrile, normotensive and ultimately required Bipap.  WBC of 6.3, hemoglobin of 10.8.  Lactate within normal limits at 1.2. Sodium of 121, K of 4.8, chloride of 87, creatinine 1.03 BNP of 874   Negative flu/COVID PCR   CTA chest shows no pulmonary embolism but there is new moderate right pleural effusion, new small left pleural effusion.  Interstitial edema and scattered groundglass density opacities.   She was given IV Rocephin, azithromycin and 20 mg of IV Lasix in the ED.  Hospitalist then consulted for admission.  11/11: Remains stable, trying to wean her off from oxygen.  Having some loose stool for the past 3 days, no abdominal pain or leukocytosis.  Can be due to regular use of Colace versus her chemo drug Tagrisso.  Discontinuing Colace  and adding some Imodium.  No need to do any C. difficile check as there is no pain, fever or leukocytosis.  Assessment and Plan:  Principal Problem:   Acute respiratory failure with hypoxia (HCC) Active Problems:   Lung cancer, Right upper lobe   Rheumatoid arthritis involving multiple joints (HCC)   Hyponatremia   Anemia of chronic disease   HTN (hypertension)   Acute on chronic combined systolic and diastolic CHF (congestive heart failure) (HCC)   Acute respiratory failure (HCC)      Acute respiratory failure with hypoxia (HCC) Secondary to bilateral pleural effusion from acute on chronic combined CHF and possible pneumonia 11/8 and 11/9-Status  post thoracentesis yielding 800 cc of amber fluid,likely transudative,  she is currently on  1L of nasal cannula.   Patient and daughter state that her breathing has significantly improved. Remains on diuresis and will complete the antibiotics today. -BiPAP as needed -DuoNeb bronchodilators   Bilateral pleural effusion -Status postthoracentesis,Fluid appears transudative.   Acute on chronic combined systolic and diastolic CHF (congestive heart failure) ([place]) Last echocardiogram on 04/2021 with EF of 40 to 45% with grade 1 diastolic dysfunction.  2D echo reviewed: Left LV EJF 40 to 45%.mildly decreased function. The left ventricle demonstrates global  hypokinesis. Left ventricular diastolic  parameters are consistent with Grade II diastolic dysfunction Mitral regurgitation -Cardiology plans to diurese to obtain euvolemic state and consider limited echo to reassess MR, most likely will do TEE as outpatient if needed for further evaluation, current recommendations are optimization of medical management.  -Per Cardiology if BP allows  consider adding ARB and/or MRA.  -Holding SGLT2i, due to recent and frequent UTIs, -IV Lasix 40mg  daily-most likely will be transition to p.o. from tomorrow. -strict intake and output -daily wts  Diarrhea.   Patient was on stool softener, current antibiotics can be contributory.  No abdominal pain, fever or leukocytosis.  No need to check for C. difficile at this time. -Discontinue Colace -Imodium as needed   HTN (hypertension) Stable. On IV Lasix.    Anemia of chronic disease Currently stable around 10. -Monitor hemoglobin -Transfuse below 7    Hyponatremia Improving, sodium at 131 today.  Concern of SIADH with history of lung cancer. -On  supplement sodium p.o. 1 g daily -Continue to monitor.   Rheumatoid arthritis involving multiple joints ([place]) On IV Orencia followed by rheumatology--hold for now   Nonsmall cell lung cancer, adenocarcinoma, stage 2a  -s/p right upper lobectomy and right middle lobe wedge resection in 2015  multifocal non-small cell lung cancer -follows with [name]  -On Tagrisso although concerned about cardiotoxicity with current CHF exacerbation Discussed with [name] okay to withhold for now  DVT prophylaxis.  Lovenox   Subjective: Patient was seen and examined today.  No baseline oxygen use.  She was complaining about watery stools for the past 3 days, no nausea, vomiting or abdominal pain.  Feeling improving but would like to spend another night as she lives alone.  Physical Exam: General.  Frail elderly lady, in no acute distress. Pulmonary.  Lungs clear bilaterally, normal respiratory effort. CV.  Regular rate and rhythm, no JVD, rub or murmur. Abdomen.  Soft, nontender, nondistended, BS positive. CNS.  Alert and oriented .  No focal neurologic deficit. Extremities.  No edema, no cyanosis, pulses intact and symmetrical. Psychiatry.  Judgment and insight appears normal.   Vitals:   12/27/21 1308 12/27/21 2010 12/28/21 0451 12/28/21 0500  BP: 128/78 134/72 132/76   Pulse: (!) 101 100 84   Resp: 16 18 18    Temp: 99.3 F (37.4 C) 98.6 F (37 C) 98.5 F (36.9 C)   TempSrc: Oral Oral Oral   SpO2: 94% 94% 98%   Weight:    50.8 kg  Height:         Data Reviewed: Prior data reviewed  Family Communication: Discussed with sister-in-law at bedside  Time spent: 45 minutes.  This record has been created using Systems analyst. Errors have been sought and corrected,but may not always be located. Such creation errors do not reflect on the standard of care.   Author: Lorella Nimrod, MD 12/28/2021 1:43 PM  For on call review [email or web address].

## 2021-12-28 NOTE — Progress Notes (Signed)
Rounding Note    Patient Name: [name] Date of Encounter: 12/28/2021  [place] Cardiologist: Kirk Ruths, MD   Subjective   Minimal net negative overnight recorded at -280 cc. Creatinine further improved to 0.89. Sodium improving, now 131. Weight down 1 kg since yesterday.  Inpatient Medications    Scheduled Meds:  acidophilus  1 capsule Oral Daily   calcium-vitamin D  1 tablet Oral Daily   carvedilol  3.125 mg Oral BID WC   Chlorhexidine Gluconate Cloth  6 each Topical Daily   docusate sodium  100 mg Oral BID   enoxaparin (LOVENOX) injection  30 mg Subcutaneous Q24H   furosemide  40 mg Intravenous Daily   gabapentin  300 mg Oral BID   gabapentin  600 mg Oral QHS   levothyroxine  75 mcg Oral QAC breakfast   pantoprazole  40 mg Oral Daily   sodium chloride  1 g Oral Daily   Continuous Infusions:  sodium chloride Stopped (12/26/21 0744)   cefTRIAXone (ROCEPHIN)  IV 1 g (12/27/21 1729)   PRN Meds: sodium chloride, albuterol, methocarbamol, mouth rinse, traMADol   Vital Signs    Vitals:   12/27/21 1308 12/27/21 2010 12/28/21 0451 12/28/21 0500  BP: 128/78 134/72 132/76   Pulse: (!) 101 100 84   Resp: 16 18 18    Temp: 99.3 F (37.4 C) 98.6 F (37 C) 98.5 F (36.9 C)   TempSrc: Oral Oral Oral   SpO2: 94% 94% 98%   Weight:    50.8 kg  Height:        Intake/Output Summary (Last 24 hours) at 12/28/2021 0900 Last data filed at 12/28/2021 0431 Gross per 24 hour  Intake 120 ml  Output 400 ml  Net -280 ml      12/28/2021    5:00 AM 12/27/2021    5:00 AM 12/26/2021    5:00 AM  Last 3 Weights  Weight (lbs) 111 lb 14.4 oz 113 lb 9.6 oz 112 lb 10.5 oz  Weight (kg) 50.758 kg 51.529 kg 51.1 kg      Telemetry   Sinus rhythm at 89- Personally Reviewed  ECG    N/A  Physical Exam   GEN: frail elderly woman in no acute distress, [place] in place NECK: No JVD CARDIAC: regular rhythm, normal S1 and S2, no rubs or gallops. 2/6 holosystolic  murmur, quieter than on admission. VASCULAR: Radial pulses 2+ bilaterally.  RESPIRATORY:  Clear bilaterally ABDOMEN: Soft, non-tender, non-distended MUSCULOSKELETAL:  MAEW SKIN: Warm and dry, no edema NEUROLOGIC:  No focal neuro deficits noted. PSYCHIATRIC:  Normal affect    Labs    High Sensitivity Troponin:   Recent Labs  Lab 12/25/21 0229  TROPONINIHS 10     Chemistry Recent Labs  Lab 12/23/21 1458 12/24/21 0458 12/25/21 0229 12/26/21 0251 12/27/21 0350 12/28/21 0516  NA 121*   < > 123* 127* 128* 131*  K 4.8   < > 4.0 3.4* 3.8 3.9  CL 87*   < > 88* 89* 93* 93*  CO2 28   < > 23 28 28 28   GLUCOSE 124*   < > 88 85 84 88  BUN 19   < > 19 21 22 23   CREATININE 1.03*   < > 1.17* 1.16* 0.99 0.89  CALCIUM 9.3   < > 8.0* 8.0* 8.2* 8.5*  MG  --   --  2.0 1.8  --   --   PROT 7.1  --   --  5.9*  --   --   ALBUMIN 4.1  --   --  2.8*  --   --   AST 27  --   --  25  --   --   ALT 12  --   --  15  --   --   ALKPHOS 34*  --   --  39  --   --   BILITOT 0.4  --   --  0.6  --   --   GFRNONAA 52*   < > 45* 45* 55* >60  ANIONGAP 6   < > 12 10 7 10    < > = values in this interval not displayed.    Lipids No results for input(s): "CHOL", "TRIG", "HDL", "LABVLDL", "LDLCALC", "CHOLHDL" in the last 168 hours.  Hematology Recent Labs  Lab 12/26/21 0251 12/27/21 0350 12/28/21 0516  WBC 6.2 5.6 5.2  RBC 3.09* 3.19* 3.36*  HGB 9.2* 9.4* 10.0*  HCT 27.9* 29.1* 30.7*  MCV 90.3 91.2 91.4  MCH 29.8 29.5 29.8  MCHC 33.0 32.3 32.6  RDW 13.1 13.1 13.2  PLT 171 199 227   Thyroid No results for input(s): "TSH", "FREET4" in the last 168 hours.  BNP Recent Labs  Lab 12/23/21 1648  BNP 874.2*    DDimer No results for input(s): "DDIMER" in the last 168 hours.   Radiology    No results found.  Cardiac Studies   Echocardiogram 11/08/18/2021: Impressions: 1. Left ventricular ejection fraction, by estimation, is 40 to 45%. Left  ventricular ejection fraction by 3D volume is 45 %. The  left ventricle has  mildly decreased function. The left ventricle demonstrates global  hypokinesis. Left ventricular diastolic   parameters are consistent with Grade II diastolic dysfunction  (pseudonormalization). Elevated left atrial pressure.   2. Right ventricular systolic function is normal. The right ventricular  size is normal. There is normal pulmonary artery systolic pressure. The  estimated right ventricular systolic pressure is 82.4 mmHg.   3. Left atrial size was moderately dilated.   4. Right atrial size was mildly dilated.   5. Large pleural effusion in the left lateral region.   6. 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 Severe  mitral valve regurgitation due to central malcoaptation with flow reversal  in the pulmonary veins and triangular "v-wave cutoff" spectral  configuration.   7. The aortic valve is tricuspid. There is mild thickening of the aortic  valve. Aortic valve regurgitation is trivial. Aortic valve sclerosis is  present, with no evidence of aortic valve stenosis.   8. The inferior vena cava is normal in size with <50% respiratory  variability, suggesting right atrial pressure of 8 mmHg.    Comparison(s): A prior study was performed on 04/17/2021. Prior images  reviewed side by side. The left ventricular function is unchanged. There  is marked worsening of the mitral insufficiency. The leaflets are  restricted and there is central  malcoaptation. LBBB is present but there does not appear to be severe LV  septal-lateral dyssynchrony.   Patient Profile     88 y.o. 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  Assessment & Plan    Acute hypoxic  respiratory failure Acute on  Chronic Combined CHF  Dilated Cardiomyopathy Mitral regurgitation -elevated BNP with bilateral pleural effusions -EF unchanged on echo, 40-45% -MR noted to now be severe. See full comments on 11/9 note re: mitraclip, Tagrisso. Summary is that she would like to re-evaluate the MR once she has been off the tagrisso for a time to see if her symptoms/MR improve. If her symptoms cannot be managed with medications, she would then consider a TEE and evaluation for mitra clip. Caveat would be we would need a clearer understanding of her predicted life expectancy from her cancer to make sure that she would have significant benefit from mitra clip. -continue diuresis, afterload reduction as able. -continue IV lasix 40 mg daily for today, anticipate change to oral diuretic in near future, possibly tomorrow. -blood pressure ranges low to normal. Continue low dose carvedilol. Once nearing euvolemia, if BP allows consider adding ARB and/or MRA.  -considered SGLT2i, but has recent and frequent UTIs, so will not start   Acute on chronic hyponatremia Chronic kidney disease, stage 3a -renal function improving with diuresis, creatinine now 0.89 -sodium up to 131  Nonsmall cell lung cancer, adenocarcinoma, stage 2a s/p right upper lobectomy and right middle lobe wedge resection in 2015 -followed as an outpatient. Planned for outpatient scan to monitor. Most recent scan noted progression of multifocal disease -was on Tagrisso, though this is on hold as above given concern for cardiac side effects  Diarrhea -says she thinks it is due to too much stool softner or the Tagrisso. Will defer to hospital medicine. Will stop colace - may need to collect and study stool stample.  For questions or updates, please contact [facility] Please consult [email or web address] for contact info under   Pixie Casino, MD, FACC, [place] Director of the  [facility] Diplomate of the American Board of Clinical Lipidology Attending Cardiologist  Direct Dial: [phone]  Fax: [phone]  Website:  [email or web address]  Pixie Casino, MD  12/28/2021, 9:00 AM

## 2021-12-28 NOTE — Plan of Care (Signed)
  Problem: Health Behavior/Discharge Planning: Goal: Ability to manage health-related needs will improve Outcome: Progressing   Problem: Clinical Measurements: Goal: Diagnostic test results will improve Outcome: Progressing   Problem: Activity: Goal: Risk for activity intolerance will decrease Outcome: Progressing   

## 2021-12-29 DIAGNOSIS — J9601 Acute respiratory failure with hypoxia: Secondary | ICD-10-CM | POA: Diagnosis not present

## 2021-12-29 DIAGNOSIS — I509 Heart failure, unspecified: Secondary | ICD-10-CM

## 2021-12-29 LAB — BASIC METABOLIC PANEL
Anion gap: 9 (ref 5–15)
BUN: 24 mg/dL — ABNORMAL HIGH (ref 8–23)
CO2: 28 mmol/L (ref 22–32)
Calcium: 8.2 mg/dL — ABNORMAL LOW (ref 8.9–10.3)
Chloride: 94 mmol/L — ABNORMAL LOW (ref 98–111)
Creatinine, Ser: 0.86 mg/dL (ref 0.44–1.00)
GFR, Estimated: >60 mL/min (ref >60)
Glucose, Bld: 88 mg/dL (ref 70–99)
Potassium: 3.8 mmol/L (ref 3.5–5.1)
Sodium: 131 mmol/L — ABNORMAL LOW (ref 135–145)

## 2021-12-29 LAB — CBC
HCT: 30 % — ABNORMAL LOW (ref 36.0–46.0)
Hemoglobin: 9.7 g/dL — ABNORMAL LOW (ref 12.0–15.0)
MCH: 29.5 pg (ref 26.0–34.0)
MCHC: 32.3 g/dL (ref 30.0–36.0)
MCV: 91.2 fL (ref 80.0–100.0)
Platelets: 246 10*3/uL (ref 150–400)
RBC: 3.29 MIL/uL — ABNORMAL LOW (ref 3.87–5.11)
RDW: 13.1 % (ref 11.5–15.5)
WBC: 4.6 10*3/uL (ref 4.0–10.5)
nRBC: 0 % (ref 0.0–0.2)

## 2021-12-29 MED ORDER — SODIUM CHLORIDE 1 G PO TABS: 1.0000 g | ORAL_TABLET | Freq: Every day | ORAL | 1 refills | Status: DC

## 2021-12-29 MED ORDER — FUROSEMIDE 40 MG PO TABS: 40.0000 mg | ORAL_TABLET | Freq: Every day | ORAL | 3 refills | Status: DC

## 2021-12-29 MED ORDER — LOPERAMIDE HCL 2 MG PO CAPS: 2.0000 mg | ORAL_CAPSULE | ORAL | 0 refills | Status: DC | PRN

## 2021-12-29 MED ORDER — FUROSEMIDE 40 MG PO TABS
40.0000 mg | ORAL_TABLET | Freq: Every day | ORAL | Status: DC
  Administered 2021-12-29: 40 mg via ORAL
  Filled 2021-12-29: qty 1

## 2021-12-29 MED ORDER — DOCUSATE SODIUM 100 MG PO CAPS: 100.0000 mg | ORAL_CAPSULE | Freq: Two times a day (BID) | ORAL | 0 refills | Status: DC | PRN

## 2021-12-29 NOTE — Discharge Summary (Signed)
Physician Discharge Summary   Patient: [name] MRN: [id number] DOB: 10-05-1930  Admit date:     12/23/2021  Discharge date: 12/29/21  Discharge Physician: Lorella Nimrod   PCP: Darreld Mclean, MD   Recommendations at discharge:  Please obtain BMP and CBC within a week Patient is being discharged on increased dose of Lasix. Also added some salt tablets for hyponatremia Follow-up with oncology, cardiology and primary care provider Completed a course of antibiotics for concern of pneumonia  Discharge Diagnoses: Principal Problem:   Acute respiratory failure with hypoxia ([name]) Active Problems:   Lung cancer, Right upper lobe   Rheumatoid arthritis involving multiple joints (HCC)   Hyponatremia   Anemia of chronic disease   HTN (hypertension)   Acute on chronic combined systolic and diastolic CHF (congestive heart failure) (HCC)   Acute respiratory failure (HCC)   Acute congestive heart failure [facility])   Hospital Course: [name] is a 88 y.o. female with medical history significant of multifocal non-small cell lung cancer, dilated cardiomyopathy, combined systolic and diastolic HF, HTN, seronegative RA,osteoporosis,sicca syndrome, raynaud'sdisease, anemia of chronic disease who presents with hypoxia and shortness of breath.    She has noticed increase shortness of breath with mild cough for the past 3 days. No fever. Trace edema and has not taken Lasix in the past 3 days. Had nausea but no vomiting. Occasional diarrhea due to her chemotherapy. Has good appetite. Pt is independent and lives by herself with daughter nearby.   Pt had follow up with oncology today and was noted to be hypoxic to 84% on RA with wheezing and mild respiratory distress. CXR showing right basilar opacity concerning for pneumonia vs atelectasis and pleural effusion. Also noted to have acute on chronic LE edema with weight. She was advised to present to ED.    In the ED, she was afebrile,  normotensive and ultimately required Bipap.  WBC of 6.3, hemoglobin of 10.8.  Lactate within normal limits at 1.2. Sodium of 121, K of 4.8, chloride of 87, creatinine 1.03 BNP of 874   Negative flu/COVID PCR   CTA chest shows no pulmonary embolism but there is new moderate right pleural effusion, new small left pleural effusion.  Interstitial edema and scattered groundglass density opacities.   She was given IV Rocephin, azithromycin and 20 mg of IV Lasix in the ED.  Hospitalist then consulted for admission.  1/11: Remains stable, trying to wean her off from oxygen.  Having some loose stool for the past 3 days, no abdominal pain or leukocytosis.  Can be due to regular use of Colace versus her chemo drug Tagrisso.  Discontinuing Colace and adding some Imodium.  No need to do any C. difficile check as there is no pain, fever or leukocytosis.   11/12: Patient was able to weaned off from oxygen.  Hemodynamically stable.  Maintaining saturation above 90% with ambulation on room air.  Continue to have loose stool, had 3 yesterday and 1 since this morning.  She was still very nervous to go home as she lives alone and wants home health services to get established before leaving the hospital tomorrow, requesting another night stay. No leukocytosis or abdominal pain.  Cardiology switched her with p.o. Lasix today. Patient was taken off from [place] for concern of cardiology side effects, cardiology will reevaluate for worsening MR as an outpatient after staying off from [place] for some time to see if there is any improvement. Completed a course of antibiotic for concern  of pneumonia. Home health services ordered.  Patient initially wants to spend another night and later agrees to go home with home health services which were ordered.  Patient will continue with the rest of her home medications and need to have a close follow-up with her providers for further recommendations.  Patient will remain high  risk for readmission, deterioration and mortality based on advanced age and life limiting comorbidities.  Assessment and Plan: * Acute respiratory failure with hypoxia (HCC) Secondary to bilateral pleural effusion from acute on chronic combined CHF and possible pneumonia -no fever, leukocytosis to suggest pneumonia. Check Procalcitonin and will keep on IV antibiotics for now -continue IV diuretic. Tx as below for CHF exacerbation. -wean Bipap as tolerated   Acute on chronic combined systolic and diastolic CHF (congestive heart failure) ([place]) Last echocardiogram on 04/2021 with EF of 40 to 45% with grade 1 diastolic dysfunction. -CTA chest showing moderate right pleural effusion, new small left pleural effusion. BNP of 874. -possible due to chemotherapy. Patient was diuresed with IV Lasix and later switched to p.o. at 40 mg daily instead of 20 mg of her home dose  HTN (hypertension) Stable.  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  Rheumatoid arthritis involving multiple joints ([place]) On IV Orencia followed by rheumatology  Lung cancer, Right upper lobe multifocal non-small cell lung cancer -follows with Dr. Julien Nordmann  -Tagrisso discontinued after talking with oncology for concern of cardiac side effects  Consultants: Cardiology Procedures performed: None Disposition: Home health Diet recommendation:  Discharge Diet Orders (From admission, onward)     Start     Ordered   12/29/21 0000  Diet - low sodium heart healthy        12/29/21 1155           Regular diet DISCHARGE MEDICATION: Allergies as of 12/29/2021       Reactions   Amlodipine Rash   Prochlorperazine Edisylate Anaphylaxis   Compazine--- tongue swells and rash   Aspirin Other (See Comments)   nose bleeds. Cannot take NSAIDS     Cymbalta [duloxetine Hcl] Diarrhea, Nausea And Vomiting, Other (See Comments)   Increased blood pressure   Pamelor [nortriptyline Hcl] Diarrhea, Nausea Only   Increased Heart rate and BP        Medication List     STOP taking these medications    cephALEXin 500 MG capsule Commonly known as: KEFLEX   losartan 25 MG tablet Commonly known as: COZAAR   Tagrisso 80 MG tablet Generic drug: osimertinib mesylate       TAKE these medications    Biotin 1000 MCG tablet Take 1,000 mcg by mouth daily.   Calcium Carbonate-Vitamin D 500-125 MG-UNIT Tabs Take 1 tablet by mouth daily.   carvedilol 3.125 MG tablet Commonly known as: COREG Take 1 tablet (3.125 mg total) by mouth 2 (two) times daily with a meal.   denosumab 60 MG/ML Sosy injection Commonly known as: PROLIA Inject 60 mg into the skin every 6 (six) months.   docusate sodium 100 MG capsule Commonly known as: COLACE Take 1 capsule (100 mg total) by mouth 2 (two) times daily as needed for mild constipation or moderate constipation. What changed:  when to take this reasons to take this   estradiol 0.1 MG/GM vaginal cream Commonly known as: ESTRACE VAGINAL Place one  gram vaginally up to three times a week as needed to maintain comfort   furosemide 40 MG tablet Commonly known as: LASIX Take 1 tablet (40 mg total) by mouth daily. Start taking on: December 30, 2021 What changed:  medication strength how much to take when to take this   gabapentin 300 MG capsule Commonly known as: NEURONTIN TAKE 1 CAPSULE BY MOUTH IN THE MORNING, 1 IN THE AFTERNOON, AND 2 AT BEDTIME What changed: See the new instructions.   GLUCOSAMINE CHONDR 1500 COMPLX PO Take 1 capsule by mouth daily.   HYDROcodone-acetaminophen 5-325 MG tablet Commonly known as: NORCO/VICODIN Take 0.5-1 tablets by mouth every 8 (eight) hours as needed.   levothyroxine 75 MCG tablet Commonly known as: SYNTHROID Take 1 tablet (75 mcg total) by mouth  daily before breakfast.   loperamide 2 MG capsule Commonly known as: IMODIUM Take 1 capsule (2 mg total) by mouth as needed for diarrhea or loose stools.   methocarbamol 500 MG tablet Commonly known as: ROBAXIN TAKE 1 TABLET BY MOUTH EVERY 6 HOURS AS NEEDED FOR MUSCLE SPASMS.   multivitamin tablet Take 1 tablet by mouth daily.   omeprazole 20 MG capsule Commonly known as: PRILOSEC TAKE 1 CAPSULE BY MOUTH EVERY DAY What changed: how much to take   ORENCIA IV Inject into the vein every 28 (twenty-eight) days.   PROBIOTIC PO Take 1 capsule by mouth daily.   sodium chloride 1 g tablet Take 1 tablet (1 g total) by mouth daily.   traMADol 50 MG tablet Commonly known as: ULTRAM Take by mouth See admin instructions. Take 0.5-1 tablet by mouth every 8 hours as needed. Do not combine with other pain medication   TYLENOL EXTRA STRENGTH PO Take 1-2 tablets by mouth every 6 (six) hours as needed (pain).   VITAMIN D PO Take by mouth daily.        Follow-up Information     [facility], Tami Lin, PA Follow up.   Specialties: Physician Assistant, Cardiology, Radiology Why: Hospital follow-up with Cardiology scheduled for 01/08/2022 at 2:05pm. Please arrive 15 minutes early for check-in. If this date/times does not work for you, please call our office to reschedule. Contact information: [address] [place] [address] [phone]         [facility] [place] Follow up.   Specialty: [facility] Contact information: [place]  [id number] [phone]                Discharge Exam: Danley Danker Weights   12/27/21 0500 12/28/21 0500 12/29/21 0500  Weight: 51.5 kg 50.8 kg 48.3 kg   General.  Frail and malnourished elderly lady, in no acute distress. Pulmonary.  Lungs clear bilaterally, normal respiratory effort. CV.  Regular rate and rhythm, no JVD, rub or murmur. Abdomen.  Soft, nontender, nondistended, BS  positive. CNS.  Alert and oriented .  No focal neurologic deficit. Extremities.  No edema, no cyanosis, pulses intact and symmetrical. Psychiatry.  Judgment and insight appears normal.   Condition at discharge: stable  The results of significant diagnostics from this hospitalization (including imaging, microbiology, ancillary and laboratory) are listed below for reference.   Imaging Studies: US THORACENTESIS ASP PLEURAL SPACE W/IMG GUIDE  Result Date: 12/25/2021 INDICATION: Patient with history of non-small cell lung cancer and prior right upper lobectomy and right middle lobe wedge resection, dyspnea, CHF, bilateral pleural effusions; request received for diagnostic and therapeutic right thoracentesis. EXAM: ULTRASOUND GUIDED DIAGNOSTIC AND THERAPEUTIC  RIGHT THORACENTESIS MEDICATIONS: 8 mL 1% lidocaine COMPLICATIONS: None immediate. PROCEDURE: An ultrasound guided thoracentesis was thoroughly discussed with the patient and questions answered. The benefits, risks, alternatives and complications were also discussed. The patient understands and wishes to proceed with the procedure. Written consent was obtained. Ultrasound was performed to localize and mark an adequate pocket of fluid in the right chest. The area was then prepped and draped in the normal sterile fashion. 1% Lidocaine was used for local anesthesia. Under ultrasound guidance a 6 Fr Safe-T-Centesis catheter was introduced. Thoracentesis was performed. The catheter was removed and a dressing applied. FINDINGS: A total of approximately 800 cc of amber fluid was removed. Samples were sent to the laboratory as requested by the clinical team. IMPRESSION: Successful ultrasound guided diagnostic and therapeutic right thoracentesis yielding 800 cc of pleural fluid. Read by: Rowe Robert, PA-C Electronically Signed   By: Ruthann Cancer M.D.   On: 12/25/2021 10:42   DG Chest 1 View  Result Date: 12/25/2021 CLINICAL DATA:  Post right thoracentesis  EXAM: PORTABLE CHEST 1 VIEW COMPARISON:  12/23/2021 FINDINGS: There has been interval reduction in right-sided pleural effusion when compare with the prior exam. Some persistent scarring is noted as well as fullness in the right hilar region stable from the prior exam. Postsurgical changes are again seen. No pneumothorax is noted. Scarring in the left base is again identified. IMPRESSION: No pneumothorax following thoracentesis. Electronically Signed   By: Inez Catalina M.D.   On: 12/25/2021 10:12   ECHOCARDIOGRAM COMPLETE  Result Date: 12/24/2021    ECHOCARDIOGRAM REPORT   Patient Name:   [name] Date of Exam: 12/24/2021 Medical Rec #:  [id number]        Height:       60.0 in Accession #:    [id number]       Weight:       114.0 lb Date of Birth:  1930-02-19        BSA:          1.470 m Patient Age:    89 years         BP:           116/70 mmHg Patient Gender: F                HR:           74 bpm. Exam Location:  Inpatient Procedure: 2D Echo, 3D Echo, Cardiac Doppler and Color Doppler Indications:    I50.40* Unspecified combined systolic (congestive) and diastolic                 (congestive) heart failure  History:        Patient has prior history of Echocardiogram examinations, most                 recent 04/17/2021. CHF, Signs/Symptoms:Shortness of Breath,                 Dyspnea and Edema; Risk Factors:Hypertension and Dyslipidemia.                 Lung cancer.  Sonographer:    Roseanna Rainbow RDCS Referring Phys: [id number] [place]  1. Left ventricular ejection fraction, by estimation, is 40 to 45%. Left ventricular ejection fraction by 3D volume is 45 %. The left ventricle has mildly decreased function. The left ventricle demonstrates global hypokinesis. Left ventricular diastolic  parameters are consistent with Grade II diastolic dysfunction (pseudonormalization). Elevated left atrial pressure.  2. Right ventricular systolic function is normal. The right ventricular size is normal. There is  normal pulmonary artery systolic pressure. The estimated right ventricular systolic pressure is 16.1 mmHg.  3. Left atrial size was moderately dilated.  4. Right atrial size was mildly dilated.  5. Large pleural effusion in the left lateral region.  6. 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 Severe mitral valve regurgitation due to central malcoaptation with flow reversal in the pulmonary veins and triangular "v-wave cutoff" spectral configuration.  7. The aortic valve is tricuspid. There is mild thickening of the aortic valve. Aortic valve regurgitation is trivial. Aortic valve sclerosis is present, with no evidence of aortic valve stenosis.  8. The inferior vena cava is normal in size with <50% respiratory variability, suggesting right atrial pressure of 8 mmHg. Comparison(s): A prior study was performed on 04/17/2021. Prior images reviewed side by side. The left ventricular function is unchanged. There is marked worsening of the mitral insufficiency. The leaflets are restricted and there is central malcoaptation. LBBB is present but there does not appear to be severe LV septal-lateral dyssynchrony. FINDINGS  Left Ventricle: Left ventricular ejection fraction, by estimation, is 40 to 45%. Left ventricular ejection fraction by 3D volume is 45 %. The left ventricle has mildly decreased function. The left ventricle demonstrates global hypokinesis. The left ventricular internal cavity size was normal in size. There is no left ventricular hypertrophy. Abnormal (paradoxical) septal motion, consistent with left bundle branch block. Left ventricular diastolic parameters are consistent with Grade II diastolic dysfunction (pseudonormalization). Elevated left atrial pressure. Right Ventricle: The right ventricular size is normal. No increase in right ventricular wall thickness. Right ventricular systolic function is normal. There is normal pulmonary artery systolic pressure. The tricuspid regurgitant  velocity is 2.45 m/s, and  with an assumed right atrial pressure of 8 mmHg, the estimated right ventricular systolic pressure is 09.6 mmHg. Left Atrium: Left atrial size was moderately dilated. Right Atrium: Right atrial size was mildly dilated. Pericardium: There is no evidence of pericardial effusion. Mitral Valve: The mitral valve is rheumatic. There is mild thickening of the anterior and posterior mitral valve leaflet(s). Severe mitral valve regurgitation, with centrally-directed jet. MV peak gradient, 6.4 mmHg. The mean mitral valve gradient is 3.0  mmHg. Tricuspid Valve: The tricuspid valve is normal in structure. Tricuspid valve regurgitation is trivial. Aortic Valve: The aortic valve is tricuspid. There is mild thickening of the aortic valve. Aortic valve regurgitation is trivial. Aortic regurgitation PHT measures 459 msec. Aortic valve sclerosis is present, with no evidence of aortic valve stenosis. Pulmonic Valve: The pulmonic valve was grossly normal. Pulmonic valve regurgitation is not visualized. Aorta: The aortic root and ascending aorta are structurally normal, with no evidence of dilitation. Venous: A pattern of systolic flow reversal, suggestive of severe mitral regurgitation is recorded from the right upper pulmonary vein. The inferior vena cava is normal in size with less than 50% respiratory variability, suggesting right atrial pressure of 8 mmHg. IAS/Shunts: No atrial level shunt detected by color flow Doppler. Additional Comments: There is a large pleural effusion in the left lateral region.  LEFT VENTRICLE PLAX 2D LVIDd:         4.80 cm         Diastology LVIDs:         3.40 cm         LV e' medial:    5.87 cm/s LV PW:         0.80 cm  LV E/e' medial:  18.6 LV IVS:        0.80 cm         LV e' lateral:   10.40 cm/s LVOT diam:     2.10 cm         LV E/e' lateral: 10.5 LV SV:         61 LV SV Index:   41 LVOT Area:     3.46 cm        3D Volume EF                                LV 3D EF:     Left                                             ventricul LV Volumes (MOD)                            ar LV vol d, MOD    128.0 ml                   ejection A2C:                                        fraction LV vol d, MOD    100.3 ml                   by 3D A4C:                                        volume is LV vol s, MOD    64.7 ml                    45 %. A2C: LV vol s, MOD    64.8 ml A4C:                           3D Volume EF: LV SV MOD A2C:   63.3 ml       3D EF:        45 % LV SV MOD A4C:   100.3 ml      LV EDV:       134 ml LV SV MOD BP:    52.6 ml       LV ESV:       74 ml                                LV SV:        60 ml RIGHT VENTRICLE             IVC RV S prime:     12.60 cm/s  IVC diam: 1.60 cm TAPSE (M-mode): 2.0 cm LEFT ATRIUM             Index        RIGHT ATRIUM           Index LA diam:  4.40 cm 2.99 cm/m   RA Area:     12.50 cm LA Vol (A2C):   43.7 ml 29.73 ml/m  RA Volume:   26.30 ml  17.89 ml/m LA Vol (A4C):   54.4 ml 37.01 ml/m LA Biplane Vol: 53.0 ml 36.06 ml/m  AORTIC VALVE LVOT Vmax:   88.60 cm/s LVOT Vmean:  59.600 cm/s LVOT VTI:    0.175 m AI PHT:      459 msec  AORTA Ao Root diam: 3.20 cm Ao Asc diam:  3.00 cm MITRAL VALVE                  TRICUSPID VALVE MV Area (PHT): 4.30 cm       TR Peak grad:   24.0 mmHg MV Area VTI:   1.54 cm       TR Vmax:        245.00 cm/s MV Peak grad:  6.4 mmHg MV Mean grad:  3.0 mmHg       SHUNTS MV Vmax:       1.26 m/s       Systemic VTI:  0.18 m MV Vmean:      81.5 cm/s      Systemic Diam: 2.10 cm MV Decel Time: 176 msec MR Peak grad:    62.1 mmHg MR Mean grad:    30.5 mmHg MR Vmax:         394.00 cm/s MR Vmean:        242.5 cm/s MR PISA:         4.02 cm MR PISA Eff ROA: 39 mm MR PISA Radius:  0.80 cm MV E velocity: 109.00 cm/s MV A velocity: 65.47 cm/s MV E/A ratio:  1.66 Mihai Croitoru MD Electronically signed by Sanda Klein MD Signature Date/Time: 12/24/2021/11:04:46 AM    Final    CT Angio Chest PE W/Cm &/Or Wo Cm  Result  Date: 12/23/2021 CLINICAL DATA:  Right upper lobe non-small cell lung cancer, status post right upper lobectomy and right middle lobe wedge resection. Shortness of breath for 1 week.  Dyspnea at rest. * Tracking Code: BO * EXAM: CT ANGIOGRAPHY CHEST WITH CONTRAST TECHNIQUE: Multidetector CT imaging of the chest was performed using the standard protocol during bolus administration of intravenous contrast. Multiplanar CT image reconstructions and MIPs were obtained to evaluate the vascular anatomy. RADIATION DOSE REDUCTION: This exam was performed according to the departmental dose-optimization program which includes automated exposure control, adjustment of the mA and/or kV according to patient size and/or use of iterative reconstruction technique. CONTRAST:  62mL OMNIPAQUE IOHEXOL 350 MG/ML SOLN COMPARISON:  09/27/2021 FINDINGS: Cardiovascular: No filling defect is identified in the pulmonary arterial tree to suggest pulmonary embolus. Aortic atherosclerosis.  Mild cardiomegaly. Mediastinum/Nodes: Partial obscuration of mediastinal structures due to the very dense contrast medium in the SVC and brachiocephalic vein. No overt pathologic adenopathy identified. Moderate-sized hiatal hernia. Lungs/Pleura: Moderate right pleural effusion, cannot exclude exudative/complex component. Small left pleural effusion. Postoperative findings in the right lung. Confluent ground-glass density opacities scattered in the right lower lobe, obscuring the previous ground-glass density nodules to some degree. Substantial passive atelectasis in the right lung. Emphysema noted. Patchy ground-glass densities in the left upper lobe corresponding to secondary pulmonary lobules with associated septal thickening in the apex compatible with interstitial edema and mild airspace edema. Passive atelectasis in the left lower lobe. Reduce conspicuity of some of the previous pulmonary nodules although some of this reduced conspicuity may be due to  surrounding ground-glass  opacities and also from motion artifact. I suspect that many of these pulmonary nodules are still present. Upper Abdomen: Unremarkable Musculoskeletal: Dextroconvex lumbar scoliosis. Severe degenerative glenohumeral arthropathy bilaterally. No compelling findings of osseous metastatic disease. Review of the MIP images confirms the above findings. IMPRESSION: 1. No filling defect is identified in the pulmonary arterial tree to suggest pulmonary embolus. 2. New moderate right pleural effusion, cannot exclude exudative/complex component. New small left pleural effusion. 3. Interstitial edema and scattered ground-glass density opacities which along with motion artifact is thought to be obscuring some of the previous pulmonary nodules. 4. Mild cardiomegaly. 5. Moderate-sized hiatal hernia. 6. Severe degenerative glenohumeral arthropathy bilaterally. 7. Aortic atherosclerosis. Aortic Atherosclerosis (ICD10-I70.0). Electronically Signed   By: Van Clines M.D.   On: 12/23/2021 18:14   DG Chest 2 View  Result Date: 12/23/2021 CLINICAL DATA:  Cough, lung cancer. EXAM: CHEST - 2 VIEW COMPARISON:  July 29, 2014.  September 30, 2021. FINDINGS: Stable cardiomediastinal silhouette. Right basilar opacity is noted concerning for pneumonia or atelectasis and associated pleural effusion. Underlying malignancy cannot be excluded. Mild left basilar atelectasis or scarring is noted. Bony thorax is unremarkable. IMPRESSION: Right basilar opacity is noted concerning for pneumonia or atelectasis and associated pleural effusion. Underlying malignancy cannot be excluded. Electronically Signed   By: Marijo Conception M.D.   On: 12/23/2021 14:42    Microbiology: Results for orders placed or performed during the hospital encounter of 12/23/21  Culture, blood (routine x 2)     Status: None   Collection Time: 12/23/21  4:48 PM   Specimen: BLOOD  Result Value Ref Range Status   Specimen Description   Final     BLOOD BLOOD RIGHT FOREARM Performed at [facility] [address] [place], [place] [address]    Special Requests   Final    BOTTLES DRAWN AEROBIC AND ANAEROBIC Blood Culture results may not be optimal due to an excessive volume of blood received in culture bottles Performed at [facility] [address] [place], [place] [address]    Culture   Final    NO GROWTH 5 DAYS Performed at [facility], [place] [address] [place], [place] [address]    Report Status 12/28/2021 FINAL  Final  Culture, blood (routine x 2)     Status: None   Collection Time: 12/23/21  4:48 PM   Specimen: BLOOD  Result Value Ref Range Status   Specimen Description   Final    BLOOD LEFT ANTECUBITAL Performed at [facility] [address] [place], [place] [address]    Special Requests   Final    BOTTLES DRAWN AEROBIC AND ANAEROBIC Blood Culture adequate volume Performed at [facility] [address] [address], [place], [place] [address]    Culture   Final    NO GROWTH 5 DAYS Performed at [facility], [place] [address], [place],  [address]    Report Status 12/28/2021 FINAL  Final  Resp Panel by RT-PCR (Flu A&B, Covid) Anterior Nasal Swab     Status: None   Collection Time: 12/23/21  5:16 PM   Specimen: Anterior Nasal Swab  Result Value Ref Range Status   SARS Coronavirus 2 by RT PCR NEGATIVE NEGATIVE Final    Comment: (NOTE) SARS-CoV-2 target nucleic acids are NOT DETECTED.  The SARS-CoV-2 RNA is generally detectable in upper respiratory specimens during the acute phase of infection. The lowest concentration of SARS-CoV-2 viral copies this  assay can detect is 138 copies/mL. A negative result does not preclude SARS-Cov-2 infection and should not be used as the sole basis for treatment or other patient management decisions. A negative result may occur with  improper specimen collection/handling, submission of specimen  other than nasopharyngeal swab, presence of viral mutation(s) within the areas targeted by this assay, and inadequate number of viral copies(<138 copies/mL). A negative result must be combined with clinical observations, patient history, and epidemiological information. The expected result is Negative.  Fact Sheet for Patients:  EntrepreneurPulse.com.au  Fact Sheet for Healthcare Providers:  IncredibleEmployment.be  This test is no t yet approved or cleared by the [place] FDA and  has been authorized for detection and/or diagnosis of SARS-CoV-2 by FDA under an Emergency Use Authorization (EUA). This EUA will remain  in effect (meaning this test can be used) for the duration of the COVID-19 declaration under Section 564(b)(1) of the Act, 21 U.S.C.section 360bbb-3(b)(1), unless the authorization is terminated  or revoked sooner.       Influenza A by PCR NEGATIVE NEGATIVE Final   Influenza B by PCR NEGATIVE NEGATIVE Final    Comment: (NOTE) The Xpert Xpress SARS-CoV-2/FLU/RSV plus assay is intended as an aid in the diagnosis of influenza from Nasopharyngeal swab specimens and should not be used as a sole basis for treatment. Nasal washings and aspirates are unacceptable for Xpert Xpress SARS-CoV-2/FLU/RSV testing.  Fact Sheet for Patients: EntrepreneurPulse.com.au  Fact Sheet for Healthcare Providers: IncredibleEmployment.be  This test is not yet approved or cleared by the [place] FDA and has been authorized for detection and/or diagnosis of SARS-CoV-2 by FDA under an Emergency Use Authorization (EUA). This EUA will remain in effect (meaning this test can be used) for the duration of the COVID-19 declaration under Section 564(b)(1) of the Act, 21 U.S.C. section 360bbb-3(b)(1), unless the authorization is terminated or revoked.  Performed at [facility], [place] [address] [place], [place] [address]   MRSA Next Gen by PCR, Nasal     Status: None   Collection Time: 12/24/21  3:36 PM   Specimen: Nasal Mucosa; Nasal Swab  Result Value Ref Range Status   MRSA by PCR Next Gen NOT DETECTED NOT DETECTED Final    Comment: (NOTE) The GeneXpert MRSA Assay (FDA approved for NASAL specimens only), is one component of a comprehensive MRSA colonization surveillance program. It is not intended to diagnose MRSA infection nor to guide or monitor treatment for MRSA infections. Test performance is not FDA approved in patients less than 66 years old. Performed at [facility], [place] [address], [place], [place] [address]   Respiratory (~20 pathogens) panel by PCR     Status: None   Collection Time: 12/24/21  6:56 PM   Specimen: Nasopharyngeal Swab; Respiratory  Result Value Ref Range Status   Adenovirus NOT DETECTED NOT DETECTED Final   Coronavirus 229E NOT DETECTED NOT DETECTED Final    Comment: (NOTE) The Coronavirus on the Respiratory Panel, DOES NOT test for the novel  Coronavirus (2019 nCoV)    Coronavirus HKU1 NOT DETECTED NOT DETECTED Final   Coronavirus NL63 NOT DETECTED NOT DETECTED Final   Coronavirus OC43 NOT DETECTED NOT DETECTED Final   Metapneumovirus NOT DETECTED NOT DETECTED Final   Rhinovirus / Enterovirus NOT DETECTED NOT DETECTED Final   Influenza A NOT DETECTED NOT DETECTED Final   Influenza B NOT DETECTED NOT DETECTED Final   Parainfluenza Virus 1 NOT DETECTED NOT DETECTED Final  Parainfluenza Virus 2 NOT DETECTED NOT DETECTED Final   Parainfluenza Virus 3 NOT DETECTED NOT DETECTED Final   Parainfluenza Virus 4 NOT DETECTED NOT DETECTED Final   Respiratory Syncytial Virus NOT DETECTED NOT DETECTED Final   Bordetella pertussis NOT DETECTED NOT DETECTED Final   Bordetella Parapertussis NOT DETECTED NOT DETECTED Final   Chlamydophila pneumoniae NOT DETECTED NOT DETECTED Final   Mycoplasma pneumoniae NOT DETECTED NOT DETECTED  Final    Comment: Performed at [facility], [place] [address] [place], [place] [address]  Culture, body fluid w Gram Stain-bottle     Status: None (Preliminary result)   Collection Time: 12/25/21 12:23 PM   Specimen: Fluid  Result Value Ref Range Status   Specimen Description FLUID PLEURAL  Final   Special Requests BOTTLES DRAWN AEROBIC AND ANAEROBIC  Final   Culture   Final    NO GROWTH 4 DAYS Performed at [facility], [place] [address] [place], [place] [address]    Report Status PENDING  Incomplete  Gram stain     Status: None   Collection Time: 12/25/21 12:23 PM   Specimen: Fluid  Result Value Ref Range Status   Specimen Description FLUID PLEURAL  Final   Special Requests NONE  Final   Gram Stain   Final    WBC PRESENT,BOTH PMN AND MONONUCLEAR NO ORGANISMS SEEN CYTOSPIN SMEAR Performed at [facility], [address], [place], [place] [address]    Report Status 12/25/2021 FINAL  Final    Labs: CBC: Recent Labs  Lab 12/23/21 1458 12/24/21 0458 12/25/21 0229 12/26/21 0251 12/27/21 0350 12/28/21 0516 12/29/21 0418  WBC 6.3   < > 7.9 6.2 5.6 5.2 4.6  NEUTROABS 4.9  --   --   --   --   --   --   HGB 10.8*   < > 9.5* 9.2* 9.4* 10.0* 9.7*  HCT 32.6*   < > 29.5* 27.9* 29.1* 30.7* 30.0*  MCV 88.8   < > 90.8 90.3 91.2 91.4 91.2  PLT 169   < > 162 171 199 227 246   < > = values in this interval not displayed.   Basic Metabolic Panel: Recent Labs  Lab 12/25/21 0229 12/26/21 0251 12/27/21 0350 12/28/21 0516 12/29/21 0418  NA 123* 127* 128* 131* 131*  K 4.0 3.4* 3.8 3.9 3.8  CL 88* 89* 93* 93* 94*  CO2 23 28 28 28 28   GLUCOSE 88 85 84 88 88  BUN 19 21 22 23  24*  CREATININE 1.17* 1.16* 0.99 0.89 0.86  CALCIUM 8.0* 8.0* 8.2* 8.5* 8.2*  MG 2.0 1.8  --   --   --   PHOS 4.2 3.2  --   --   --    Liver Function Tests: Recent Labs  Lab 12/23/21 1458 12/26/21 0251  AST 27 25  ALT 12 15  ALKPHOS 34* 39  BILITOT 0.4 0.6  PROT 7.1 5.9*  ALBUMIN 4.1  2.8*   CBG: No results for input(s): "GLUCAP" in the last 168 hours.  Discharge time spent: greater than 30 minutes.  This record has been created using Systems analyst. Errors have been sought and corrected,but may not always be located. Such creation errors do not reflect on the standard of care.   Signed: Lorella Nimrod, MD [facility] 12/29/2021

## 2021-12-29 NOTE — Progress Notes (Signed)
SATURATION QUALIFICATIONS:   Patient Saturations on Room Air at Rest = 93%  Patient Saturations on Room Air while Ambulating = 92%  Pt's PR have came up to 130-140 bpm twice  non-sustained while ambulating for 75 ft.

## 2021-12-29 NOTE — TOC Transition Note (Signed)
Transition of Care [facility]) - CM/SW Discharge Note   Patient Details  Name: [name] MRN: [id number] Date of Birth: June 13, 1930  Transition of Care [facility]) CM/SW Contact:  [name], RN Phone Number: 12/29/2021, 11:36 AM   Clinical Narrative:    Orders received for  [facility], and [place] aide;  order for [facility] by MD b/c not indicated; HHPT previously arranged with [place]; spoke with pt in room  and dtrn Aleen Campi [phone]) via phone; discussed d/c plans for HHPT and [place] aide; the are agreeable to this plan; Dr Reesa Chew notified; notified Alwyn Ren at [facility] of pt d/c today, and addition of [place] aide; pt will d/c today; her dtr will transport her home; no TOC needs.   Final next level of care: [place] Barriers to Discharge: Continued Medical Work up   Patient Goals and CMS Choice        Discharge Placement                       Discharge Plan and Services                                     Social Determinants of Health (SDOH) Interventions     Readmission Risk Interventions     No data to display

## 2021-12-29 NOTE — Progress Notes (Signed)
Rounding Note    Patient Name: [name] Date of Encounter: 12/29/2021  [place] Cardiologist: Kirk Ruths, MD   Subjective   Minimal net negative overnight again. Creatinine stable and sodium stable. Weight down 2 kg since yesterday.  Inpatient Medications    Scheduled Meds:  acidophilus  1 capsule Oral Daily   calcium-vitamin D  1 tablet Oral Daily   carvedilol  3.125 mg Oral BID WC   Chlorhexidine Gluconate Cloth  6 each Topical Daily   enoxaparin (LOVENOX) injection  30 mg Subcutaneous Q24H   furosemide  40 mg Intravenous Daily   gabapentin  300 mg Oral BID   gabapentin  600 mg Oral QHS   levothyroxine  75 mcg Oral QAC breakfast   pantoprazole  40 mg Oral Daily   sodium chloride  1 g Oral Daily   Continuous Infusions:  sodium chloride Stopped (12/26/21 0744)   cefTRIAXone (ROCEPHIN)  IV 1 g (12/28/21 1907)   PRN Meds: sodium chloride, albuterol, loperamide, methocarbamol, mouth rinse, traMADol   Vital Signs    Vitals:   12/28/21 1345 12/28/21 2043 12/29/21 0500 12/29/21 0506  BP: 128/68 112/61  131/74  Pulse: 89 92  (!) 103  Resp: 16 18  18   Temp: 98.6 F (37 C) 98.1 F (36.7 C)  99.1 F (37.3 C)  TempSrc: Oral Oral  Oral  SpO2: 93% 93%  92%  Weight:   48.3 kg   Height:        Intake/Output Summary (Last 24 hours) at 12/29/2021 1638 Last data filed at 12/28/2021 2317 Gross per 24 hour  Intake 120 ml  Output 750 ml  Net -630 ml      12/29/2021    5:00 AM 12/28/2021    5:00 AM 12/27/2021    5:00 AM  Last 3 Weights  Weight (lbs) 106 lb 8 oz 111 lb 14.4 oz 113 lb 9.6 oz  Weight (kg) 48.308 kg 50.758 kg 51.529 kg      Telemetry   Sinus rhythm - Personally Reviewed  ECG    N/A  Physical Exam   GEN: frail elderly woman in no acute distress, [place] in place NECK: No JVD CARDIAC: regular rhythm, normal S1 and S2, no rubs or gallops. 2/6 holosystolic murmur, quieter than on admission. VASCULAR: Radial pulses 2+  bilaterally.  RESPIRATORY:  Clear bilaterally ABDOMEN: Soft, non-tender, non-distended MUSCULOSKELETAL:  MAEW SKIN: Warm and dry, no edema NEUROLOGIC:  No focal neuro deficits noted. PSYCHIATRIC:  Normal affect    Labs    High Sensitivity Troponin:   Recent Labs  Lab 12/25/21 0229  TROPONINIHS 10     Chemistry Recent Labs  Lab 12/23/21 1458 12/24/21 0458 12/25/21 0229 12/26/21 0251 12/27/21 0350 12/28/21 0516 12/29/21 0418  NA 121*   < > 123* 127* 128* 131* 131*  K 4.8   < > 4.0 3.4* 3.8 3.9 3.8  CL 87*   < > 88* 89* 93* 93* 94*  CO2 28   < > 23 28 28 28 28   GLUCOSE 124*   < > 88 85 84 88 88  BUN 19   < > 19 21 22 23  24*  CREATININE 1.03*   < > 1.17* 1.16* 0.99 0.89 0.86  CALCIUM 9.3   < > 8.0* 8.0* 8.2* 8.5* 8.2*  MG  --   --  2.0 1.8  --   --   --   PROT 7.1  --   --  5.9*  --   --   --  ALBUMIN 4.1  --   --  2.8*  --   --   --   AST 27  --   --  25  --   --   --   ALT 12  --   --  15  --   --   --   ALKPHOS 34*  --   --  39  --   --   --   BILITOT 0.4  --   --  0.6  --   --   --   GFRNONAA 52*   < > 45* 45* 55* >60 >60  ANIONGAP 6   < > 12 10 7 10 9    < > = values in this interval not displayed.    Lipids No results for input(s): "CHOL", "TRIG", "HDL", "LABVLDL", "LDLCALC", "CHOLHDL" in the last 168 hours.  Hematology Recent Labs  Lab 12/27/21 0350 12/28/21 0516 12/29/21 0418  WBC 5.6 5.2 4.6  RBC 3.19* 3.36* 3.29*  HGB 9.4* 10.0* 9.7*  HCT 29.1* 30.7* 30.0*  MCV 91.2 91.4 91.2  MCH 29.5 29.8 29.5  MCHC 32.3 32.6 32.3  RDW 13.1 13.2 13.1  PLT 199 227 246   Thyroid No results for input(s): "TSH", "FREET4" in the last 168 hours.  BNP Recent Labs  Lab 12/23/21 1648  BNP 874.2*    DDimer No results for input(s): "DDIMER" in the last 168 hours.   Radiology    No results found.  Cardiac Studies   Echocardiogram 11/08/18/2021: Impressions: 1. Left ventricular ejection fraction, by estimation, is 40 to 45%. Left  ventricular ejection fraction  by 3D volume is 45 %. The left ventricle has  mildly decreased function. The left ventricle demonstrates global  hypokinesis. Left ventricular diastolic   parameters are consistent with Grade II diastolic dysfunction  (pseudonormalization). Elevated left atrial pressure.   2. Right ventricular systolic function is normal. The right ventricular  size is normal. There is normal pulmonary artery systolic pressure. The  estimated right ventricular systolic pressure is 62.3 mmHg.   3. Left atrial size was moderately dilated.   4. Right atrial size was mildly dilated.   5. Large pleural effusion in the left lateral region.   6. 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 Severe  mitral valve regurgitation due to central malcoaptation with flow reversal  in the pulmonary veins and triangular "v-wave cutoff" spectral  configuration.   7. The aortic valve is tricuspid. There is mild thickening of the aortic  valve. Aortic valve regurgitation is trivial. Aortic valve sclerosis is  present, with no evidence of aortic valve stenosis.   8. The inferior vena cava is normal in size with <50% respiratory  variability, suggesting right atrial pressure of 8 mmHg.    Comparison(s): A prior study was performed on 04/17/2021. Prior images  reviewed side by side. The left ventricular function is unchanged. There  is marked worsening of the mitral insufficiency. The leaflets are  restricted and there is central  malcoaptation. LBBB is present but there does not appear to be severe LV  septal-lateral dyssynchrony.   Patient Profile     88 y.o. 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  Assessment &  Plan  Acute hypoxic respiratory failure Acute on Chronic Combined CHF  Dilated Cardiomyopathy Mitral regurgitation -elevated BNP with bilateral pleural effusions -EF unchanged on echo, 40-45% -MR noted to now be severe. See full comments on 11/9 note re: mitraclip, Tagrisso. Summary is that she would like to re-evaluate the MR once she has been off the tagrisso for a time to see if her symptoms/MR improve. If her symptoms cannot be managed with medications, she would then consider a TEE and evaluation for mitra clip. Caveat would be we would need a clearer understanding of her predicted life expectancy from her cancer to make sure that she would have significant benefit from mitra clip. -Will transition to oral diuretic today -considered SGLT2i, but has recent and frequent UTIs, so will not start   Acute on chronic hyponatremia Chronic kidney disease, stage 3a -renal function improving with diuresis, creatinine now 0.89 -sodium up to 131 -both stable  Nonsmall cell lung cancer, adenocarcinoma, stage 2a s/p right upper lobectomy and right middle lobe wedge resection in 2015 -followed as an outpatient. Planned for outpatient scan to monitor. Most recent scan noted progression of multifocal disease -was on Tagrisso, though this is on hold as above given concern for cardiac side effects  Diarrhea -says she thinks it is due to too much stool softner or the Tagrisso. Will defer to hospital medicine. Will stop colace - may need to collect and study stool stample. -this is still an issue - getting immodium -management per primary team  For questions or updates, please contact [place] Please consult [email or web address] for contact info under   Pixie Casino, MD, FACC, [place] Director of the [facility]  Cardiovascular [facility] Diplomate of the American Board of Clinical Lipidology Attending Cardiologist  Direct  Dial: [phone]  Fax: [phone]  Website:  [email or web address]  Pixie Casino, MD  12/29/2021, 9:58 AM

## 2021-12-29 NOTE — Progress Notes (Signed)
Consultation Progress Note   Patient: [name] ZTI:[id number] DOB: 1930/05/20 DOA: 12/23/2021 DOS: the patient was seen and examined on 12/29/2021 Primary service: Raven Nimrod, MD  Brief hospital course: [name] is a 88 y.o. female with medical history significant of multifocal non-small cell lung cancer, dilated cardiomyopathy, combined systolic and diastolic HF, HTN, seronegative RA,osteoporosis,sicca syndrome, raynaud'sdisease, anemia of chronic disease who presents with hypoxia and shortness of breath.    She has noticed increase shortness of breath with mild cough for the past 3 days. No fever. Trace edema and has not taken Lasix in the past 3 days. Had nausea but no vomiting. Occasional diarrhea due to her chemotherapy. Has good appetite. Pt is independent and lives by herself with daughter nearby.   Pt had follow up with oncology today and was noted to be hypoxic to 84% on RA with wheezing and mild respiratory distress. CXR showing right basilar opacity concerning for pneumonia vs atelectasis and pleural effusion. Also noted to have acute on chronic LE edema with weight. She was advised to present to ED.    In the ED, she was afebrile, normotensive and ultimately required Bipap.  WBC of 6.3, hemoglobin of 10.8.  Lactate within normal limits at 1.2. Sodium of 121, K of 4.8, chloride of 87, creatinine 1.03 BNP of 874   Negative flu/COVID PCR   CTA chest shows no pulmonary embolism but there is new moderate right pleural effusion, new small left pleural effusion.  Interstitial edema and scattered groundglass density opacities.   She was given IV Rocephin, azithromycin and 20 mg of IV Lasix in the ED.  Hospitalist then consulted for admission.  11/11: Remains stable, trying to wean her off from oxygen.  Having some loose stool for the past 3 days, no abdominal pain or leukocytosis.  Can be due to regular use of Colace versus her chemo drug Tagrisso.  Discontinuing Colace  and adding some Imodium.  No need to do any C. difficile check as there is no pain, fever or leukocytosis.  11/12: Patient was able to weaned off from oxygen.  Hemodynamically stable.  Maintaining saturation above 90% with ambulation on room air.  Continue to have loose stool, had 3 yesterday and 1 since this morning.  She was still very nervous to go home as she lives alone and wants home health services to get established before leaving the hospital tomorrow, requesting another night stay. No leukocytosis or abdominal pain.  Cardiology switched her with p.o. Lasix today. Patient was taken off from [place] for concern of cardiology side effects, cardiology will reevaluate for worsening MR as an outpatient after staying off from [place] for some time to see if there is any improvement. Completed a course of antibiotic for concern of pneumonia. Home health services ordered.  Assessment and Plan:  Principal Problem:   Acute respiratory failure with hypoxia (HCC) Active Problems:   Lung cancer, Right upper lobe   Rheumatoid arthritis involving multiple joints (HCC)   Hyponatremia   Anemia of chronic disease   HTN (hypertension)   Acute on chronic combined systolic and diastolic CHF (congestive heart failure) (HCC)   Acute respiratory failure (HCC)      Acute respiratory failure with hypoxia (HCC) Secondary to bilateral pleural effusion from acute on chronic combined CHF and possible pneumonia 11/8 and 11/9-Status  post thoracentesis yielding 800 cc of amber fluid,likely transudative, completed the course of antibiotics for concern of CAP. Able to weaned off from oxygen.  Currently stable  on room air. -BiPAP as needed -DuoNeb bronchodilators   Bilateral pleural effusion -Status postthoracentesis,Fluid appears transudative.   Acute on chronic combined systolic and diastolic CHF (congestive heart failure) ([place]) Last echocardiogram on 04/2021 with EF of 40 to 45% with grade 1 diastolic  dysfunction.  2D echo reviewed: Left LV EJF 40 to 45%.mildly decreased function. The left ventricle demonstrates global  hypokinesis. Left ventricular diastolic  parameters are consistent with Grade II diastolic dysfunction Mitral regurgitation -Cardiology plans to diurese to obtain euvolemic state and consider limited echo to reassess MR, most likely will do TEE as outpatient if needed for further evaluation, current recommendations are optimization of medical management.  -Per Cardiology if BP allows consider adding ARB and/or MRA.  -Holding SGLT2i, due to recent and frequent UTIs, -IV Lasix switched with 40 mg p.o. today -strict intake and output -daily wts  Diarrhea.  Patient was on stool softener, current antibiotics can be contributory.  No abdominal pain, fever or leukocytosis.  No need to check for C. difficile at this time. -Discontinue Colace -Imodium as needed   HTN (hypertension) Stable. On IV Lasix.    Anemia of chronic disease Currently stable around 10. -Monitor hemoglobin -Transfuse below 7    Hyponatremia Improving, sodium at 131 today.  Concern of SIADH with history of lung cancer. -On  supplement sodium p.o. 1 g daily -Continue to monitor.   Rheumatoid arthritis involving multiple joints ([place]) On IV [place]cia followed by rheumatology--hold for now   Nonsmall cell lung cancer, adenocarcinoma, stage 2a  -s/p right upper lobectomy and right middle lobe wedge resection in 2015  multifocal non-small cell lung cancer -follows with Dr. Julien Ellis  -On Tagrisso although concerned about cardiotoxicity with current CHF exacerbation Discussed with Dr. [place]ie Ellis okay to withhold for now  DVT prophylaxis.  Lovenox   Subjective: Patient was seen and examined today.  Continued to have some loose stool, had 1 since morning and 3 yesterday.  No abdominal pain, nausea or vomiting.  Tolerating diet well.  Able to wean off from oxygen.  She was very nervous going home today stating  that she lives alone and wants to spend another night to get home health services established before leaving.  Physical Exam: General.  Frail elderly lady, in no acute distress. Pulmonary.  Lungs clear bilaterally, normal respiratory effort. CV.  Regular rate and rhythm, no JVD, rub or murmur. Abdomen.  Soft, nontender, nondistended, BS positive. CNS.  Alert and oriented .  No focal neurologic deficit. Extremities.  No edema, no cyanosis, pulses intact and symmetrical. Psychiatry.  Judgment and insight appears normal.   Vitals:   12/28/21 1345 12/28/21 2043 12/29/21 0500 12/29/21 0506  BP: 128/68 112/61  131/74  Pulse: 89 92  (!) 103  Resp: 16 18  18   Temp: 98.6 F (37 C) 98.1 F (36.7 C)  99.1 F (37.3 C)  TempSrc: Oral Oral  Oral  SpO2: 93% 93%  92%  Weight:   48.3 kg   Height:        Data Reviewed: Prior data reviewed  Family Communication: Discussed with sister-in-law at bedside  Time spent: 40 minutes.  This record has been created using Systems analyst. Errors have been sought and corrected,but may not always be located. Such creation errors do not reflect on the standard of care.   Author: Lorella Nimrod, MD 12/29/2021 10:25 AM  For on call review [email or web address].

## 2021-12-30 ENCOUNTER — Telehealth: Payer: Self-pay

## 2021-12-30 LAB — CULTURE, BODY FLUID W GRAM STAIN -BOTTLE: Culture: NO GROWTH

## 2021-12-30 NOTE — Telephone Encounter (Signed)
Transition Care Management Follow-up Telephone Call Date of discharge and from where: [facility] 12-29-21 PJ:SRPRX respiratory failure with hypoxia How have you been since you were released from the hospital? Feeling some better  Any questions or concerns? No  Items Reviewed: Did the pt receive and understand the discharge instructions provided? Yes  Medications obtained and verified? Yes  Other? No  Any new allergies since your discharge? No  Dietary orders reviewed? Yes Do you have support at home? Yes   Home Care and Equipment/Supplies: Were home health services ordered? yes If so, what is the name of the agency?[facility]   Has the agency set up a time to come to the patient's home? no Were any new equipment or medical supplies ordered?  No What is the name of the medical supply agency? [facility] Were you able to get the supplies/equipment? not applicable Do you have any questions related to the use of the equipment or supplies? No  Functional Questionnaire: (I = Independent and D = Dependent) ADLs: I  Bathing/Dressing- I  Meal Prep- I  Eating- I  Maintaining continence- I  Transferring/Ambulation- I-WALKER  Managing Meds- I  Follow up appointments reviewed:  PCP Hospital f/u appt confirmed? Yes  Scheduled to see Dr [name] on 01-02-22 @ [facility] f/u appt confirmed? Yes  Scheduled to see Dr [name]  on 01-08-22 @ 220pm. Are transportation arrangements needed? No  If their condition worsens, is the pt aware to call PCP or go to the Emergency Dept.? Yes Was the patient provided with contact information for the PCP's office or ED? Yes Was to pt encouraged to call back with questions or concerns? Yes   Juanda Crumble LPN [place] Direct Dial [phone]

## 2021-12-31 ENCOUNTER — Other Ambulatory Visit (HOSPITAL_COMMUNITY): Payer: Self-pay

## 2021-12-31 NOTE — Progress Notes (Addendum)
[facility] [place] [place], [place], [place] [address] [phone]  Date:  01/02/2022   Name:  [name]   DOB:  September 30, 1930   MRN:  [id number]  PCP:  Darreld Mclean, MD    Chief Complaint: Hospitalization Follow-up   History of Present Illness:  [name] is a 88 y.o. 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 Also added some salt tablets for hyponatremia Follow-up with oncology, cardiology and primary care provider Completed a course of antibiotics for concern of pneumonia   Discharge Diagnoses: Principal Problem:   Acute respiratory failure with hypoxia ([place]) Active Problems:   Lung cancer, Right upper lobe   Rheumatoid arthritis involving multiple joints (HCC)   Hyponatremia   Anemia of chronic disease   HTN (hypertension)   Acute on chronic combined systolic and diastolic CHF (congestive heart failure) (HCC)   Acute respiratory failure (HCC)   Acute congestive heart failure [name]) Hospital Course: [name] is a 88 y.o. female with medical history significant of multifocal non-small cell lung cancer, dilated cardiomyopathy, combined systolic and diastolic HF, HTN, seronegative RA,osteoporosis,sicca syndrome, raynaud'sdisease, anemia of chronic disease who presents with hypoxia and shortness of breath.  She has noticed increase shortness of breath with mild cough for the past 3 days. No  fever. Trace edema and has not taken Lasix in the past 3 days. Had nausea but no vomiting. Occasional diarrhea due to her chemotherapy. Has good appetite. Pt is independent and lives by herself with daughter nearby. Pt had follow up with oncology today and was noted to be hypoxic to 84% on RA with wheezing and mild respiratory distress. CXR showing right basilar opacity concerning for pneumonia vs atelectasis and pleural effusion. Also noted to have acute on chronic LE edema with weight. She was advised to present to ED.    In the ED, she was afebrile, normotensive and ultimately required Bipap.  WBC of 6.3, hemoglobin of 10.8.  Lactate within normal limits at 1.2. Sodium of 121, K of 4.8, chloride of 87, creatinine 1.03 BNP of 874 Negative flu/COVID PCR   CTA chest shows no pulmonary embolism but there is new moderate right pleural effusion, new small left pleural effusion.  Interstitial edema and scattered groundglass density opacities. She was given IV Rocephin, azithromycin and 20 mg of IV Lasix in the ED.  Hospitalist then consulted for admission.   1/11: Remains stable, trying to wean her off from oxygen.  Having some loose stool for the past 3 days, no abdominal pain or leukocytosis.  Can be due to regular use of Colace versus her chemo drug Tagrisso.  Discontinuing Colace and adding some Imodium.  No need to do any C. difficile check as there is no pain, fever or leukocytosis.   11/12: Patient was able to weaned off from oxygen.  Hemodynamically stable.  Maintaining saturation above 90% with ambulation on room air.  Continue to have loose stool, had 3 yesterday and 1 since this morning.  She was still very nervous to go home as she lives alone and wants home health services to get established before leaving the hospital tomorrow, requesting another night stay. No leukocytosis or abdominal pain.  Cardiology switched her with p.o. Lasix today. Patient was taken off from [facility] for concern of  cardiology side effects, cardiology will reevaluate for worsening MR as an outpatient after staying off from [facility] for some time to see if there is any improvement. Completed a course of antibiotic for concern of pneumonia. Home health services ordered. Patient initially wants to spend another night and later agrees to go home with home health services which were ordered. Patient will continue with the rest of her home medications and need to have a close follow-up with her providers for further recommendations. Patient will remain high risk for readmission, deterioration and mortality based on advanced age and life limiting comorbidities. Assessment and Plan: * Acute respiratory failure with hypoxia (HCC) Secondary to bilateral pleural effusion from acute on chronic combined CHF and possible pneumonia -no fever, leukocytosis to suggest pneumonia. Check Procalcitonin and will keep on IV antibiotics for now -continue IV diuretic. Tx as below for CHF exacerbation. -wean Bipap as tolerated  Acute on chronic combined systolic and diastolic CHF (congestive heart failure) ([place]) Last echocardiogram on 04/2021 with EF of 40 to 45% with grade 1 diastolic dysfunction. -CTA chest showing moderate right pleural effusion, new small left pleural effusion. BNP of 874. -possible due to chemotherapy. Patient was diuresed with IV Lasix and later switched to p.o. at 40 mg daily instead of 20 mg of her home dose HTN (hypertension) Stable. Anemia of chronic disease Stable Hgb at baseline of 10.8 Hyponatremia 127 (10/17)- 126 (11/2)- 121 on presentation Concern of SIADH with underlying lung malignancy, sodium improved to baseline with Lasix and salt tablets. Rheumatoid arthritis involving multiple joints ([place]) On IV Orencia followed by rheumatology Lung cancer, Right upper lobe multifocal non-small cell lung cancer -follows with Dr. Julien Nordmann  -Tagrisso discontinued after talking with oncology for concern of  cardiac side effects Consultants: Cardiology Procedures performed: None Disposition: Home health   Now that she is back home, [name] is still feeing tired but she is improved PT came to her home yesterday to do an assessment- her sat was 97% by their machine   Weight at home this am 106 She was 105 yesterday She considers 140 106 pounds to be her baseline weight  Her daughter notes she had a thoracentesis with removal of 800 mL of fluid while in the hospital.  Done on 11/8 She had only minimal lower extremity edema during this episode  Wt Readings from Last 3 Encounters:  12/29/21 106 lb 8 oz (48.3 kg)  12/23/21 114 lb 14.4 oz (52.1 kg)  12/19/21 111 lb 8 oz (50.6 kg)     Patient Active Problem List   Diagnosis Date Noted   Acute congestive heart failure ([place]) 12/29/2021   Acute respiratory failure ([place]) 12/24/2021   Acute respiratory failure with hypoxia ([place]) 12/23/2021   Acute on chronic combined systolic and diastolic CHF (congestive heart failure) ([place]) 12/23/2021   Rectal pain 10/10/2020   Acute posthemorrhagic anemia    Rectal bleeding 06/17/2019   Grade II internal hemorrhoids    Rectal ulcer    Popliteal artery occlusion, right ([place]) 04/10/2018   HTN (hypertension) 04/10/2018   Femoral artery pseudo-aneurysm, right ([place]) 04/09/2018   Anemia of chronic disease 02/24/2018   Unstable right hip arthroplasty 01/24/2018   History of  revision of total replacement of right hip joint 01/24/2018   Hypovolemic shock (HCC)    Hyperkalemia    Hyponatremia    Failed total hip arthroplasty ([place]) 11/27/2017   Status post revision of total hip 11/27/2017   Elevated cholesterol 10/11/2015   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[place]) 10/04/2014   Bilateral leg edema 08/01/2014   Elevated BP 08/01/2014   Rheumatoid arthritis involving multiple joints ([place]) 05/30/2014   Status post total replacement of right hip 04/28/2014   Long-term use of high-risk medication 11/11/2013   Symptomatic  anemia 11/11/2013   Neuropathic pain 07/07/2013   Constipation due to pain medication 07/07/2013   Protein-calorie malnutrition, severe ([place]) 06/08/2013   Lung cancer, Right upper lobe 05/08/2013   Sciatica of right side 08/13/2011   Osteoporosis 03/07/2010   PARESTHESIA 03/07/2010   CT, CHEST, ABNORMAL 12/18/2008   ABNORMAL ECHOCARDIOGRAM 12/14/2008   SJOGREN'S SYNDROME 11/29/2008   HYPOGLYCEMIA 06/29/2006   RAYNAUD'S DISEASE 06/29/2006    Past Medical History:  Diagnosis Date   Arthralgia of multiple joints    followed by dr Gerilyn Nestle   Arthritis    Cardiomyopathy ([place])    Chronic constipation    Chronic inflammatory arthritis    rhemotolgist-  dr a. Gerilyn Nestle ([facility])   Dry eyes    eye drops used    GERD (gastroesophageal reflux disease)    H/O discoid lupus erythematosus    Hiatal hernia    History of colon polyps    Hypothyroidism    Iron deficiency anemia    LBBB (left bundle branch block) 2010   Mitchell's disease (erythromelalgia) ([place])    neurologist-  dr patel   Nocturia    Non-small cell cancer of right lung [facility]) surgeon-- dr gerhardt/  oncologist-  dr Julien Nordmann--- per lov notes no recurrence/   11-18-2017 per pt denies any symptoms   dx 2015--  Stage IIA (T2b,N0,M0) , +EGFR  mutation in exon 21, non-small cell adenocarcinoma right upper lobe---  s/p  Right upper lobectomy , right middley wedge resection and node dissection---  no chemo or radiation therapy   OA (osteoarthritis)    hands   Osteoporosis    PONV (postoperative nausea and vomiting)    likes phenergan   Raynaud's phenomenon 1965   Renal insufficiency    Rheumatoid arthritis ([place])    Sciatica    Scoliosis    Sjogren's syndrome [facility])     Past Surgical History:  Procedure Laterality Date   ANTERIOR HIP REVISION Right 11/27/2017   Procedure: RIGHT HIP ACETABULAR REVISION;  Surgeon: [name], MD;  Location: [facility];  Service: Orthopedics;  Laterality: Right;   ANTERIOR  HIP REVISION Right 01/24/2018   Procedure: OPEN REDUCTION OF DISLOCATED ANTERIOR HIP WITH REVISION OF LINER AND HIP BALL;  Surgeon: [name], MD;  [facility]: [facility];  Service: Orthopedics;  Laterality: Right;   APPENDECTOMY  1950s   BIOPSY  04/14/2018   Procedure: BIOPSY;  Surgeon: Yetta Flock, MD;  Location: [facility];  Service: Gastroenterology;;   BIOPSY  04/16/2018   Procedure: BIOPSY;  Surgeon: Irving Copas., MD;  Location: [facility];  Service: Gastroenterology;;   CARDIOVASCULAR STRESS TEST  12/2008    mild fixed basal to mid septal perfusion defect felt likely due to artifact from LBBB, no ischemia, EF 58%   COLONOSCOPY     COLONOSCOPY WITH PROPOFOL N/A 04/16/2018   Procedure: COLONOSCOPY WITH PROPOFOL;  Surgeon: Irving Copas., MD;  Location: [facility];  Service: Gastroenterology;  Laterality: N/A;   ESOPHAGOGASTRODUODENOSCOPY (EGD) WITH PROPOFOL N/A 04/14/2018   Procedure: ESOPHAGOGASTRODUODENOSCOPY (EGD) WITH PROPOFOL;  Surgeon: Yetta Flock, MD;  Location: [facility];  Service: Gastroenterology;  Laterality: N/A;   FEMORAL-POPLITEAL BYPASS GRAFT Right 04/10/2018   Procedure: REPAIR RIGHT FEMORAL ARTERY PSEUDOANEURYSM, RETROPERITONEAL EXPOSURE OF ILIAC ARTERY, RIGHT POPLITEAL EMBOLECTOMY;  Surgeon: Angelia Mould, MD;  Location: [facility];  Service: Vascular;  Laterality: Right;   FLEXIBLE SIGMOIDOSCOPY N/A 06/17/2019   Procedure: FLEXIBLE SIGMOIDOSCOPY;  Surgeon: Lavena Bullion, DO;  [facility];  Service: Gastroenterology;  Laterality: N/A;   HEMOSTASIS CLIP PLACEMENT  06/17/2019   Procedure: HEMOSTASIS CLIP PLACEMENT;  Surgeon: Lavena Bullion, DO;  [facility];  Service: Gastroenterology;;   LYMPH NODE DISSECTION Right 06/07/2013   Procedure: LYMPH NODE DISSECTION;  Surgeon: [name], MD;  Location: [facility];  Service: Thoracic;  Laterality: Right;   PATCH ANGIOPLASTY Right 04/10/2018    Procedure: PATCH  ANGIOPLASTY OF RIGHT FEMORAL ARTERY USING BOVINE PATCH, PATCH ANGIOPLASTY OF RIGHT POPLITEAL ARTERY USING BOVINE PATCH;  Surgeon: Angelia Mould, MD;  Location: [place];  Service: Vascular;  Laterality: Right;   SCHLEROTHERAPY  06/17/2019   Procedure: [place] Ku OF VARICES;  Surgeon: Lavena Bullion, DO;  [facility];  Service: Gastroenterology;;   [place]   "large incision from chest to up to shoulder, the nerves were tied together, for raynaud's   THORACOTOMY  06/07/2013   Procedure: MINI/LIMITED THORACOTOMY; right middle lobe wedge resection;  Surgeon: [name], MD;  Location: [facility];  Service: Thoracic;;   TONSILLECTOMY  child   TOTAL ABDOMINAL HYSTERECTOMY  1980's    W/ BSO   TOTAL HIP ARTHROPLASTY Right 04/28/2014   Procedure: RIGHT TOTAL HIP ARTHROPLASTY ANTERIOR APPROACH;  Surgeon: [name], MD;  [facility];  Service: Orthopedics;  Laterality: Right;   TRANSTHORACIC ECHOCARDIOGRAM  12/11/2008   ef 41-93%, grade 1 diastolic dysfunction/  mild LAE/  mild AR and MR/  trivial TR   VIDEO ASSISTED THORACOSCOPY (VATS)/WEDGE RESECTION Right 06/07/2013   Procedure: VIDEO ASSISTED THORACOSCOPY (VATS)/right upper lobectomy, On Q;  Surgeon: [name], MD;  [facility];  Service: Thoracic;  Laterality: Right;   VIDEO BRONCHOSCOPY N/A 06/07/2013   Procedure: VIDEO BRONCHOSCOPY;  Surgeon: [name], MD;  [facility];  Service: Thoracic;  Laterality: N/A;   VIDEO BRONCHOSCOPY WITH ENDOBRONCHIAL NAVIGATION N/A 05/04/2013   Procedure: VIDEO BRONCHOSCOPY WITH ENDOBRONCHIAL NAVIGATION;  Surgeon: [name], MD;  [facility];  Service: Thoracic;  Laterality: N/A;    Social History   Tobacco Use   Smoking status: Never    Passive exposure: Past   Smokeless tobacco: Never  Vaping Use   Vaping Use: Never used  Substance Use Topics   Alcohol use: Not Currently   Drug use: Never     Family History  Problem Relation Age of Onset   Coronary artery disease Father    Colon cancer Father    Diabetes Father    Cancer Father        colon   Other Mother 68       MVA   Healthy Sister    Healthy Brother    Healthy Daughter    Hypothyroidism Daughter    Other Brother        killed in war   Pneumonia Sister    Healthy Daughter    Esophageal cancer Neg Hx  Kidney disease Neg Hx    Liver disease Neg Hx     Allergies  Allergen Reactions   Amlodipine Rash   Prochlorperazine Edisylate Anaphylaxis    Compazine--- tongue swells and rash   Aspirin Other (See Comments)    nose bleeds. Cannot take NSAIDS    Cymbalta [Duloxetine Hcl] Diarrhea, Nausea And Vomiting and Other (See Comments)    Increased blood pressure   Pamelor [Nortriptyline Hcl] Diarrhea and Nausea Only    Increased Heart rate and BP    Medication list has been reviewed and updated.  Current Outpatient Medications on File Prior to Visit  Medication Sig Dispense Refill   Abatacept (ORENCIA IV) Inject into the vein every 28 (twenty-eight) days.     Acetaminophen (TYLENOL EXTRA STRENGTH PO) Take 1-2 tablets by mouth every 6 (six) hours as needed (pain).      Biotin 1000 MCG tablet Take 1,000 mcg by mouth daily.     Calcium Carbonate-Vitamin D 500-125 MG-UNIT TABS Take 1 tablet by mouth daily.     carvedilol (COREG) 3.125 MG tablet Take 1 tablet (3.125 mg total) by mouth 2 (two) times daily with a meal. 180 tablet 3   denosumab (PROLIA) 60 MG/ML SOSY injection Inject 60 mg into the skin every 6 (six) months.     estradiol (ESTRACE VAGINAL) 0.1 MG/GM vaginal cream Place one gram vaginally up to three times a week as needed to maintain comfort 42.5 g 12   furosemide (LASIX) 40 MG tablet Take 1 tablet (40 mg total) by mouth daily. 30 tablet 3   gabapentin (NEURONTIN) 300 MG capsule TAKE 1 CAPSULE BY MOUTH IN THE MORNING, 1 IN THE AFTERNOON, AND 2 AT BEDTIME (Patient taking differently: See admin  instructions. Take one capsule (373m) by mouth in the morning, one capsule (3083m by mouth in the afternoon, and two capsules (600 mg) by mouth at bedtime) 360 capsule 1   Glucosamine-Chondroit-Vit C-Mn (GLUCOSAMINE CHONDR 1500 COMPLX PO) Take 1 capsule by mouth daily.     HYDROcodone-acetaminophen (NORCO/VICODIN) 5-325 MG tablet Take 0.5-1 tablets by mouth every 8 (eight) hours as needed. 30 tablet 0   levothyroxine (SYNTHROID) 75 MCG tablet Take 1 tablet (75 mcg total) by mouth daily before breakfast. 90 tablet 1   loperamide (IMODIUM) 2 MG capsule Take 1 capsule (2 mg total) by mouth as needed for diarrhea or loose stools. 30 capsule 0   methocarbamol (ROBAXIN) 500 MG tablet TAKE 1 TABLET BY MOUTH EVERY 6 HOURS AS NEEDED FOR MUSCLE SPASMS. (Patient taking differently: Take 500 mg by mouth every 6 (six) hours as needed for muscle spasms.) 40 tablet 0   Multiple Vitamin (MULTIVITAMIN) tablet Take 1 tablet by mouth daily.     omeprazole (PRILOSEC) 20 MG capsule TAKE 1 CAPSULE BY MOUTH EVERY DAY (Patient taking differently: Take 20 mg by mouth daily.) 90 capsule 3   Probiotic Product (PROBIOTIC PO) Take 1 capsule by mouth daily.      sodium chloride 1 g tablet Take 1 tablet (1 g total) by mouth daily. 30 tablet 1   traMADol (ULTRAM) 50 MG tablet Take by mouth See admin instructions. Take 0.5-1 tablet by mouth every 8 hours as needed. Do not combine with other pain medication     VITAMIN D PO Take by mouth daily.     No current facility-administered medications on file prior to visit.    Review of Systems:  As per HPI- otherwise negative.   Physical Examination: Vitals:   01/02/22  1619  BP: 120/62  Pulse: 87  Resp: 18  SpO2: 97%   There were no vitals filed for this visit. There is no height or weight on file to calculate BMI. Ideal Body Weight:    GEN: no acute distress.  Petite build, kyphosis.  Looks well and her normal self today HEENT: Atraumatic, Normocephalic.  Ears and  Nose: No external deformity. CV: RRR, No M/G/R. No JVD. No thrill. No extra heart sounds. PULM: CTA B, no wheezes, crackles, rhonchi. No retractions. No resp. distress. No accessory muscle use. ABD: S, NT, ND. No rebound. No HSM. EXTR: No c/c/there is minimal right lower extremity edema.  Patient states this is improved since her hospital admission PSYCH: Normally interactive. Conversant.    Assessment and Plan: Hyponatremia - Plan: Basic metabolic panel  Mild anemia - Plan: CBC  Pleural effusion - Plan: DG Chest [facility] discharge follow-up  [name] is following up today from recent hospital admission as above.  She had acute respiratory failure with large pleural effusion, likely due at least in part to chemotherapeutic medication associated heart failure.  The offending medication was stopped, she was diuresed by mouth and also had a thoracentesis.  She is now back home and feeling significantly improved  We will follow-up on her blood counts and sodium today, chest x-ray  I will be in touch with her results ASAP Cardiology and oncology follow-up as already scheduled  Signed Lamar Blinks, MD  Received her chest film as below.  Message to patient.  This chest film is in comparison with her postthoracentesis film performed on 11/8.  It does show improvement from effusion seen on 11/6  DG Chest 2 View  Result Date: 01/02/2022 CLINICAL DATA:  Follow-up effusion. EXAM: CHEST - 2 VIEW COMPARISON:  Radiographs dated December 25, 2021 FINDINGS: The heart is mildly enlarged. Atherosclerotic calcification of the aortic arch. Right pleural effusion with atelectasis of the right lower lobe, unchanged. Hazy lung opacities/postsurgical changes in the right upper lobe, unchanged. Advanced bilateral glenohumeral osteoarthritis. S shaped scoliosis of the thoracolumbar spine. IMPRESSION: 1. Right pleural effusion with atelectasis of the right lower lobe, unchanged. 2. Hazy lung  opacities/postsurgical changes in the right upper lobe, unchanged. Electronically Signed   By: Keane Police D.O.   On: 01/02/2022 17:00    Addnd 11/17- received labs as below, message to pt  Results for orders placed or performed in visit on 01/02/22  CBC  Result Value Ref Range   WBC 5.5 4.0 - 10.5 K/uL   RBC 3.34 (L) 3.87 - 5.11 Mil/uL   Platelets 333.0 150.0 - 400.0 K/uL   Hemoglobin 10.0 (L) 12.0 - 15.0 g/dL   HCT 29.8 (L) 36.0 - 46.0 %   MCV 89.2 78.0 - 100.0 fl   MCHC 33.5 30.0 - 36.0 g/dL   RDW 13.8 11.5 - 31.4 %  Basic metabolic panel  Result Value Ref Range   Sodium 131 (L) 135 - 145 mEq/L   Potassium 4.7 3.5 - 5.1 mEq/L   Chloride 95 (L) 96 - 112 mEq/L   CO2 30 19 - 32 mEq/L   Glucose, Bld 109 (H) 70 - 99 mg/dL   BUN 31 (H) 6 - 23 mg/dL   Creatinine, Ser 1.37 (H) 0.40 - 1.20 mg/dL   GFR 34.33 (L) >60.00 mL/min   Calcium 8.5 8.4 - 10.5 mg/dL

## 2022-01-01 ENCOUNTER — Telehealth: Payer: Self-pay

## 2022-01-01 ENCOUNTER — Ambulatory Visit: Payer: Medicare PPO | Admitting: Family Medicine

## 2022-01-01 NOTE — Telephone Encounter (Signed)
Verbal orders requested from [facility] at [facility] PT Once weekly for 8 weeks  Verbal orders given and she has a Hos F.u for 01/02/22.     FYI

## 2022-01-02 ENCOUNTER — Encounter: Payer: Self-pay | Admitting: Family Medicine

## 2022-01-02 ENCOUNTER — Ambulatory Visit (HOSPITAL_BASED_OUTPATIENT_CLINIC_OR_DEPARTMENT_OTHER)
Admission: RE | Admit: 2022-01-02 | Discharge: 2022-01-02 | Disposition: A | Discharge status: Home / Self Care | Payer: Medicare PPO | Source: Ambulatory Visit | Attending: Family Medicine | Admitting: Family Medicine

## 2022-01-02 ENCOUNTER — Ambulatory Visit: Payer: Medicare PPO | Admitting: Family Medicine

## 2022-01-02 VITALS — BP 120/62 | HR 87 | Resp 18

## 2022-01-02 DIAGNOSIS — I700 Atherosclerosis of aorta: Secondary | ICD-10-CM | POA: Diagnosis not present

## 2022-01-02 DIAGNOSIS — D649 Anemia, unspecified: Secondary | ICD-10-CM

## 2022-01-02 DIAGNOSIS — E871 Hypo-osmolality and hyponatremia: Secondary | ICD-10-CM | POA: Diagnosis not present

## 2022-01-02 DIAGNOSIS — J90 Pleural effusion, not elsewhere classified: Secondary | ICD-10-CM

## 2022-01-02 DIAGNOSIS — Z09 Encounter for follow-up examination after completed treatment for conditions other than malignant neoplasm: Secondary | ICD-10-CM

## 2022-01-02 DIAGNOSIS — J9811 Atelectasis: Secondary | ICD-10-CM | POA: Diagnosis not present

## 2022-01-02 NOTE — Patient Instructions (Signed)
Good to see you again today- I will be in touch with your labs and chest x-ray asap   Please let me know if you need anything

## 2022-01-03 ENCOUNTER — Encounter: Payer: Self-pay | Admitting: Internal Medicine

## 2022-01-03 ENCOUNTER — Encounter: Payer: Self-pay | Admitting: Family Medicine

## 2022-01-03 LAB — BASIC METABOLIC PANEL
BUN: 31 mg/dL — ABNORMAL HIGH (ref 6–23)
CO2: 30 mEq/L (ref 19–32)
Calcium: 8.5 mg/dL (ref 8.4–10.5)
Chloride: 95 mEq/L — ABNORMAL LOW (ref 96–112)
Creatinine, Ser: 1.37 mg/dL — ABNORMAL HIGH (ref 0.40–1.20)
GFR: 34.33 mL/min — ABNORMAL LOW (ref >60.00)
Glucose, Bld: 109 mg/dL — ABNORMAL HIGH (ref 70–99)
Potassium: 4.7 mEq/L (ref 3.5–5.1)
Sodium: 131 mEq/L — ABNORMAL LOW (ref 135–145)

## 2022-01-03 LAB — CBC
HCT: 29.8 % — ABNORMAL LOW (ref 36.0–46.0)
Hemoglobin: 10 g/dL — ABNORMAL LOW (ref 12.0–15.0)
MCHC: 33.5 g/dL (ref 30.0–36.0)
MCV: 89.2 fl (ref 78.0–100.0)
Platelets: 333 10*3/uL (ref 150.0–400.0)
RBC: 3.34 Mil/uL — ABNORMAL LOW (ref 3.87–5.11)
RDW: 13.8 % (ref 11.5–15.5)
WBC: 5.5 10*3/uL (ref 4.0–10.5)

## 2022-01-04 ENCOUNTER — Telehealth: Payer: Self-pay | Admitting: Internal Medicine

## 2022-01-04 NOTE — Telephone Encounter (Signed)
Called patient regarding upcoming December appointment, patient is notified.

## 2022-01-08 ENCOUNTER — Other Ambulatory Visit: Payer: Self-pay

## 2022-01-08 ENCOUNTER — Ambulatory Visit: Payer: Medicare PPO | Admitting: Physician Assistant

## 2022-01-11 ENCOUNTER — Other Ambulatory Visit: Payer: Self-pay | Admitting: Family Medicine

## 2022-01-11 DIAGNOSIS — M25512 Pain in left shoulder: Secondary | ICD-10-CM

## 2022-01-14 ENCOUNTER — Encounter: Payer: Self-pay | Admitting: Family Medicine

## 2022-01-14 ENCOUNTER — Other Ambulatory Visit: Payer: Self-pay

## 2022-01-14 DIAGNOSIS — M25552 Pain in left hip: Secondary | ICD-10-CM

## 2022-01-14 NOTE — Telephone Encounter (Signed)
Patient has Orencia infusion on 01/15/22. Can have labs for Prolia drawn at that visit.  Knox Saliva, PharmD, MPH, BCPS, CPP Clinical Pharmacist (Rheumatology and Pulmonology)

## 2022-01-15 ENCOUNTER — Ambulatory Visit (HOSPITAL_COMMUNITY)
Admission: RE | Admit: 2022-01-15 | Discharge: 2022-01-15 | Disposition: A | Discharge status: Home / Self Care | Payer: Medicare PPO | Source: Ambulatory Visit | Attending: Rheumatology | Admitting: Rheumatology

## 2022-01-15 DIAGNOSIS — M0609 Rheumatoid arthritis without rheumatoid factor, multiple sites: Secondary | ICD-10-CM | POA: Diagnosis not present

## 2022-01-15 MED ORDER — DENOSUMAB 60 MG/ML ~~LOC~~ SOSY
PREFILLED_SYRINGE | SUBCUTANEOUS | Status: AC
  Filled 2022-01-15: qty 1

## 2022-01-15 MED ORDER — DIPHENHYDRAMINE HCL 25 MG PO CAPS: 25.0000 mg | ORAL_CAPSULE | ORAL | Status: DC

## 2022-01-15 MED ORDER — ACETAMINOPHEN 325 MG PO TABS: 650.0000 mg | ORAL_TABLET | ORAL | Status: DC

## 2022-01-15 MED ORDER — SODIUM CHLORIDE 0.9 % IV SOLN
500.0000 mg | INTRAVENOUS | Status: DC
  Administered 2022-01-15: 500 mg via INTRAVENOUS
  Filled 2022-01-15: qty 20

## 2022-01-15 MED ORDER — DENOSUMAB 60 MG/ML ~~LOC~~ SOSY
60.0000 mg | PREFILLED_SYRINGE | Freq: Once | SUBCUTANEOUS | Status: AC
  Administered 2022-01-15: 60 mg via SUBCUTANEOUS

## 2022-01-16 NOTE — Telephone Encounter (Signed)
Patient received Prolia on 01/15/22 at [facility].  Knox Saliva, PharmD, MPH, BCPS, CPP Clinical Pharmacist (Rheumatology and Pulmonology)

## 2022-01-20 ENCOUNTER — Encounter: Payer: Self-pay | Admitting: Physician Assistant

## 2022-01-20 ENCOUNTER — Ambulatory Visit (INDEPENDENT_AMBULATORY_CARE_PROVIDER_SITE_OTHER): Payer: Medicare PPO | Admitting: Physician Assistant

## 2022-01-20 DIAGNOSIS — M7061 Trochanteric bursitis, right hip: Secondary | ICD-10-CM | POA: Diagnosis not present

## 2022-01-20 MED ORDER — LIDOCAINE HCL 1 % IJ SOLN
3.0000 mL | INTRAMUSCULAR | Status: AC | PRN
  Administered 2022-01-20: 3 mL

## 2022-01-20 MED ORDER — METHYLPREDNISOLONE ACETATE 40 MG/ML IJ SUSP
40.0000 mg | INTRAMUSCULAR | Status: AC | PRN
  Administered 2022-01-20: 40 mg via INTRA_ARTICULAR

## 2022-01-20 NOTE — Progress Notes (Signed)
   Procedure Note  Patient: [name]             Date of Birth: [birth year]           MRN: [id number]             Visit Date: 01/20/2022  HPI: Mrs. [name] is well-known to Dr. Delilah Shan service comes in today due to right hip pain.  She states that her pain began after returning to home after being in the hospital for respiratory failure with hypoxia.  She has been working with home health and doing PT and states that she started having associating pain lateral aspect right hip worse with standing.  Also painful when lying on the hip.  She has a history of right total hip arthroplasty years ago.  She is having no groin pain.  Review of systems: Denies any fevers chills.  Nondiabetic.  Physical exam: General: No acute distress.  Elderly female sitting up and ambulate with a slow slight antalgic gait. Bilateral hips good range of motion without pain.  Tenderness over the right hip trochanteric region and down the IT band.   Procedures: Visit Diagnoses:  1. Trochanteric bursitis, right hip     Large Joint Inj: R greater trochanter on 01/20/2022 2:54 PM Indications: pain Details: 22 G 1.5 in needle, lateral approach  Arthrogram: No  Medications: 3 mL lidocaine 1 %; 40 mg methylPREDNISolone acetate 40 MG/ML Outcome: tolerated well, no immediate complications Procedure, treatment alternatives, risks and benefits explained, specific risks discussed. Consent was given by the patient. Immediately prior to procedure a time out was called to verify the correct patient, procedure, equipment, support staff and site/side marked as required. Patient was prepped and draped in the usual sterile fashion.     Plan: She will follow-up with [place] as needed pain persist or becomes worse.  Questions encouraged and answered

## 2022-01-21 ENCOUNTER — Other Ambulatory Visit (HOSPITAL_COMMUNITY): Payer: Self-pay

## 2022-01-21 ENCOUNTER — Encounter: Payer: Self-pay | Admitting: Family Medicine

## 2022-01-21 MED ORDER — HYDROCODONE-ACETAMINOPHEN 5-325 MG PO TABS: 1.0000 | ORAL_TABLET | Freq: Two times a day (BID) | ORAL | 0 refills | Status: DC | PRN

## 2022-01-21 NOTE — Telephone Encounter (Signed)
Okay for refill on this Rx?

## 2022-01-22 ENCOUNTER — Encounter (HOSPITAL_COMMUNITY): Payer: Self-pay

## 2022-01-22 ENCOUNTER — Encounter (HOSPITAL_COMMUNITY): Payer: Medicare PPO

## 2022-01-22 ENCOUNTER — Ambulatory Visit: Payer: Medicare PPO | Admitting: Internal Medicine

## 2022-01-22 ENCOUNTER — Ambulatory Visit (HOSPITAL_COMMUNITY)
Admission: RE | Admit: 2022-01-22 | Discharge: 2022-01-22 | Disposition: A | Discharge status: Home / Self Care | Payer: Medicare PPO | Source: Ambulatory Visit | Attending: Internal Medicine | Admitting: Internal Medicine

## 2022-01-22 ENCOUNTER — Inpatient Hospital Stay: Payer: Medicare PPO | Attending: Internal Medicine

## 2022-01-22 ENCOUNTER — Other Ambulatory Visit: Payer: Self-pay

## 2022-01-22 DIAGNOSIS — M7989 Other specified soft tissue disorders: Secondary | ICD-10-CM | POA: Insufficient documentation

## 2022-01-22 DIAGNOSIS — C3490 Malignant neoplasm of unspecified part of unspecified bronchus or lung: Secondary | ICD-10-CM

## 2022-01-22 DIAGNOSIS — M79661 Pain in right lower leg: Secondary | ICD-10-CM | POA: Insufficient documentation

## 2022-01-22 DIAGNOSIS — R918 Other nonspecific abnormal finding of lung field: Secondary | ICD-10-CM | POA: Diagnosis not present

## 2022-01-22 DIAGNOSIS — Z85118 Personal history of other malignant neoplasm of bronchus and lung: Secondary | ICD-10-CM | POA: Insufficient documentation

## 2022-01-22 DIAGNOSIS — J90 Pleural effusion, not elsewhere classified: Secondary | ICD-10-CM | POA: Diagnosis not present

## 2022-01-22 DIAGNOSIS — Z08 Encounter for follow-up examination after completed treatment for malignant neoplasm: Secondary | ICD-10-CM | POA: Insufficient documentation

## 2022-01-22 LAB — CBC WITH DIFFERENTIAL (CANCER CENTER ONLY)
Abs Immature Granulocytes: 0.03 10*3/uL (ref 0.00–0.07)
Basophils Absolute: 0 10*3/uL (ref 0.0–0.1)
Basophils Relative: 1 %
Eosinophils Absolute: 0.1 10*3/uL (ref 0.0–0.5)
Eosinophils Relative: 2 %
HCT: 32.3 % — ABNORMAL LOW (ref 36.0–46.0)
Hemoglobin: 10.6 g/dL — ABNORMAL LOW (ref 12.0–15.0)
Immature Granulocytes: 1 %
Lymphocytes Relative: 19 %
Lymphs Abs: 0.8 10*3/uL (ref 0.7–4.0)
MCH: 29.3 pg (ref 26.0–34.0)
MCHC: 32.8 g/dL (ref 30.0–36.0)
MCV: 89.2 fL (ref 80.0–100.0)
Monocytes Absolute: 0.5 10*3/uL (ref 0.1–1.0)
Monocytes Relative: 13 %
Neutro Abs: 2.7 10*3/uL (ref 1.7–7.7)
Neutrophils Relative %: 64 %
Platelet Count: 207 10*3/uL (ref 150–400)
RBC: 3.62 MIL/uL — ABNORMAL LOW (ref 3.87–5.11)
RDW: 13.5 % (ref 11.5–15.5)
WBC Count: 4.1 10*3/uL (ref 4.0–10.5)
nRBC: 0 % (ref 0.0–0.2)

## 2022-01-22 LAB — CMP (CANCER CENTER ONLY)
ALT: 13 U/L (ref 0–44)
AST: 26 U/L (ref 15–41)
Albumin: 4.3 g/dL (ref 3.5–5.0)
Alkaline Phosphatase: 39 U/L (ref 38–126)
Anion gap: 8 (ref 5–15)
BUN: 26 mg/dL — ABNORMAL HIGH (ref 8–23)
CO2: 27 mmol/L (ref 22–32)
Calcium: 9.7 mg/dL (ref 8.9–10.3)
Chloride: 94 mmol/L — ABNORMAL LOW (ref 98–111)
Creatinine: 0.9 mg/dL (ref 0.44–1.00)
GFR, Estimated: >60 mL/min (ref >60)
Glucose, Bld: 70 mg/dL (ref 70–99)
Potassium: 4.6 mmol/L (ref 3.5–5.1)
Sodium: 129 mmol/L — ABNORMAL LOW (ref 135–145)
Total Bilirubin: 0.4 mg/dL (ref 0.3–1.2)
Total Protein: 7.2 g/dL (ref 6.5–8.1)

## 2022-01-22 MED ORDER — IOHEXOL 300 MG/ML  SOLN
75.0000 mL | Freq: Once | INTRAMUSCULAR | Status: AC | PRN
  Administered 2022-01-22: 75 mL via INTRAVENOUS

## 2022-01-22 MED ORDER — SODIUM CHLORIDE (PF) 0.9 % IJ SOLN
INTRAMUSCULAR | Status: AC
  Filled 2022-01-22: qty 50

## 2022-01-22 NOTE — Progress Notes (Unsigned)
Cardiology Office Note:    Date:  01/22/2022   ID:  [name], DOB [birth year], MRN [id number]  PCP:  Darreld Mclean, MD   [facility] Providers Cardiologist:  Kirk Ruths, MD { Click to update primary MD,subspecialty MD or APP then REFRESH:1}    Referring MD: Copland, Gay Filler, MD   No chief complaint on file. ***  History of Present Illness:    [name] is a 88 y.o. female with a hx of dilated cardiomyopathy, chronic combined systolic and diastolic heart failure, non-small cell lung cancer, CKD stage IIIa, palpitations (PVC, SVT on monitor 2021), arthritis, anemia, and LBBB.  Nuclear stress test in 2010 showed a fixed septal defect related to LBBB but no ischemia.  Echo in 03/2018 showed improved EF from 45-50% to 60-65% grade 1 DD.  Unfortunately last echocardiogram 04/2021 showed an LVEF 40-45% with no RWMA, grade 1 DD, mild MR and mild AI.  Cardiomyopathy felt possibly due to dyssynchrony from LBBB and conservative management was recommended given patient's age and lung cancer.  Lung cancer diagnosed 04/2713 treated with RUL lobectomy and wedge resection of right middle lobe.  She did well following her lobectomy but recent CT scan 09/2021 showed interval growth of numerous solid and subsolid pulmonary nodules throughout both lungs most compatible with progressive metastatic disease.  She presented to the ER 12/23/2021 with acute hypoxic respiratory failure, acute on chronic combined CHF, and mitral regurgitation with right pleural effusion.  She required right-sided thoracentesis.  EF was unchanged on echo 40-45% with grade 2 DD, but MR noted to now be severe.  GDMT was recommended especially afterload reduction given MR. She was diuresed. Mitral regurgitations felt possible amenable to mitra clip but would involve imaging with TEE. Also discussed that tagrisso may be partially responsible for her heart failure and MR. Pt opted to see how she feels off of tagrisso.  Dr. Harrell Gave mentioned that we would need a clearer understanding of her life expectancy from her now metastatic lung cancer in order to make decisions regarding mitra-clip.   She was discharged on PO lasix. She was not started on SGLT2i due to frequent UTIs.   Repeat CT chest showed significant improvement in airspace disease.  Her daughter did call stating the patient was more confused recently.   She presents today for follow up.     Chronic systolic and diastolic heart failure Dilated cardiomyopathy GDMT: 3.125 mg coreg BID, 40 mg lasix She stopped losartan prior to hospitalization No SGLT2i due to frequent UTIs   Severe MR Noted on echo 12/24/21 May be a mitra clip candidate, but will need to speak with oncology regarding life expectancy now off of tagrisso   CKD stage IIIa Stable at discharge   NSCLC Now with evidence of metastatic disease Following with oncology Now off tagrisso        Past Medical History:  Diagnosis Date   Arthralgia of multiple joints    followed by dr Gerilyn Nestle   Arthritis    Cardiomyopathy ([place])    Chronic constipation    Chronic inflammatory arthritis    rhemotolgist-  dr a. Gerilyn Nestle ([facility])   Dry eyes    eye drops used    GERD (gastroesophageal reflux disease)    H/O discoid lupus erythematosus    Hiatal hernia    History of colon polyps    Hypothyroidism    Iron deficiency anemia    LBBB (left bundle branch block) 2010  Mitchell's disease (erythromelalgia) [facility])    neurologist-  dr patel   Nocturia    Non-small cell cancer of right lung [facility]) surgeon-- dr gerhardt/  oncologist-  dr Julien Nordmann--- per lov notes no recurrence/   11-18-2017 per pt denies any symptoms   dx 2015--  Stage IIA (T2b,N0,M0) , +EGFR  mutation in exon 21, non-small cell adenocarcinoma right upper lobe---  s/p  Right upper lobectomy , right middley wedge resection and node dissection---  no chemo or radiation therapy   OA (osteoarthritis)     hands   Osteoporosis    PONV (postoperative nausea and vomiting)    likes phenergan   Raynaud's phenomenon 1965   Renal insufficiency    Rheumatoid arthritis ([place])    Sciatica    Scoliosis    Sjogren's syndrome [facility])     Past Surgical History:  Procedure Laterality Date   ANTERIOR HIP REVISION Right 11/27/2017   Procedure: RIGHT HIP ACETABULAR REVISION;  Surgeon: [name], MD;  Location: [facility];  Service: Orthopedics;  Laterality: Right;   ANTERIOR HIP REVISION Right 01/24/2018   Procedure: OPEN REDUCTION OF DISLOCATED ANTERIOR HIP WITH REVISION OF LINER AND HIP BALL;  Surgeon: [name], MD;  Location: [facility];  Service: Orthopedics;  Laterality: Right;   APPENDECTOMY  1950s   BIOPSY  04/14/2018   Procedure: BIOPSY;  Surgeon: Yetta Flock, MD;  Location: [place];  Service: Gastroenterology;;   BIOPSY  04/16/2018   Procedure: BIOPSY;  Surgeon: Irving Copas., MD;  Location: [place];  Service: Gastroenterology;;   CARDIOVASCULAR STRESS TEST  12/2008    mild fixed basal to mid septal perfusion defect felt likely due to artifact from LBBB, no ischemia, EF 58%   COLONOSCOPY     COLONOSCOPY WITH PROPOFOL N/A 04/16/2018   Procedure: COLONOSCOPY WITH PROPOFOL;  Surgeon: Irving Copas., MD;  Location: [place];  Service: Gastroenterology;  Laterality: N/A;   ESOPHAGOGASTRODUODENOSCOPY (EGD) WITH PROPOFOL N/A 04/14/2018   Procedure: ESOPHAGOGASTRODUODENOSCOPY (EGD) WITH PROPOFOL;  Surgeon: Yetta Flock, MD;  Location: [place];  Service: Gastroenterology;  Laterality: N/A;   FEMORAL-POPLITEAL BYPASS GRAFT Right 04/10/2018   Procedure: REPAIR RIGHT FEMORAL ARTERY PSEUDOANEURYSM, RETROPERITONEAL EXPOSURE OF ILIAC ARTERY, RIGHT POPLITEAL EMBOLECTOMY;  Surgeon: Angelia Mould, MD;  Location: [place];  Service: Vascular;  Laterality: Right;   FLEXIBLE SIGMOIDOSCOPY N/A 06/17/2019   Procedure: FLEXIBLE SIGMOIDOSCOPY;   Surgeon: Lavena Bullion, DO;  [facility];  Service: Gastroenterology;  Laterality: N/A;   HEMOSTASIS CLIP PLACEMENT  06/17/2019   Procedure: HEMOSTASIS CLIP PLACEMENT;  Surgeon: Lavena Bullion, DO;  [facility];  Service: Gastroenterology;;   LYMPH NODE DISSECTION Right 06/07/2013   Procedure: LYMPH NODE DISSECTION;  Surgeon: [name], MD;  Location: [place];  Service: Thoracic;  Laterality: Right;   PATCH ANGIOPLASTY Right 04/10/2018   Procedure: PATCH  ANGIOPLASTY OF RIGHT FEMORAL ARTERY USING BOVINE PATCH, PATCH ANGIOPLASTY OF RIGHT POPLITEAL ARTERY USING BOVINE PATCH;  Surgeon: Angelia Mould, MD;  Location: [place];  Service: Vascular;  Laterality: Right;   SCHLEROTHERAPY  06/17/2019   Procedure: [place] Ku OF VARICES;  Surgeon: Lavena Bullion, DO;  [facility];  Service: Gastroenterology;;   [place]   "large incision from chest to up to shoulder, the nerves were tied together, for raynaud's   THORACOTOMY  06/07/2013   Procedure: MINI/LIMITED THORACOTOMY; right middle lobe wedge resection;  Surgeon: [name], MD;  Location: [facility]  OR;  Service: Thoracic;;   TONSILLECTOMY  child   TOTAL ABDOMINAL HYSTERECTOMY  1980's    W/ BSO   TOTAL HIP ARTHROPLASTY Right 04/28/2014   Procedure: RIGHT TOTAL HIP ARTHROPLASTY ANTERIOR APPROACH;  Surgeon: [name], MD;  [facility];  Service: Orthopedics;  Laterality: Right;   TRANSTHORACIC ECHOCARDIOGRAM  12/11/2008   ef 53-29%, grade 1 diastolic dysfunction/  mild LAE/  mild AR and MR/  trivial TR   VIDEO ASSISTED THORACOSCOPY (VATS)/WEDGE RESECTION Right 06/07/2013   Procedure: VIDEO ASSISTED THORACOSCOPY (VATS)/right upper lobectomy, On Q;  Surgeon: [name], MD;  Location: [facility] OR;  Service: Thoracic;  Laterality: Right;   VIDEO BRONCHOSCOPY N/A 06/07/2013   Procedure: VIDEO BRONCHOSCOPY;  Surgeon: [name], MD;  Location: [place];   Service: Thoracic;  Laterality: N/A;   VIDEO BRONCHOSCOPY WITH ENDOBRONCHIAL NAVIGATION N/A 05/04/2013   Procedure: VIDEO BRONCHOSCOPY WITH ENDOBRONCHIAL NAVIGATION;  Surgeon: [name], MD;  Location: [facility];  Service: Thoracic;  Laterality: N/A;    Current Medications: No outpatient medications have been marked as taking for the 01/29/22 encounter (Appointment) with Ledora Bottcher, [place].     Allergies:   Amlodipine, Prochlorperazine edisylate, Aspirin, Cymbalta [duloxetine hcl], and Pamelor [nortriptyline hcl]   Social History   Socioeconomic History   Marital status: Widowed    Spouse name: Not on file   Number of children: 2   Years of education: Not on file   Highest education level: Not on file  Occupational History   Occupation: n/a  Tobacco Use   Smoking status: Never    Passive exposure: Past   Smokeless tobacco: Never  Vaping Use   Vaping Use: Never used  Substance and Sexual Activity   Alcohol use: Not Currently   Drug use: Never   Sexual activity: Not Currently    Birth control/protection: Surgical  Other Topics Concern   Not on file  Social History Narrative   Lives with husband, daughter and grandchild local.   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   Social Determinants of Health   Financial Resource Strain: Meta  (05/07/2021)   Overall Financial Resource Strain (CARDIA)    Difficulty of Paying Living Expenses: Not hard at all  Food Insecurity: No Food Insecurity (05/07/2021)   Hunger Vital Sign    Worried About Running Out of Food in the Last Year: Never true    [place] in the Last Year: Never true  Transportation Needs: No Transportation Needs (05/07/2021)   PRAPARE - Hydrologist (Medical): No    Lack of Transportation (Non-Medical): No  Physical Activity: Insufficiently Active (05/07/2021)   Exercise Vital Sign    Days of Exercise per Week: 3 days    Minutes of  Exercise per Session: 30 min  Stress: No Stress Concern Present (05/07/2021)   [place]    Feeling of Stress : Not at all  Social Connections: Moderately Integrated (05/07/2021)   Social Connection and Isolation Panel [NHANES]    Frequency of Communication with Friends and Family: More than three times a week    Frequency of Social Gatherings with Friends and Family: More than three times a week    Attends Religious Services: More than 4 times per year    Active Member of [employer] or Organizations: Yes    Attends Archivist Meetings: 1 to 4 times  per year    Marital Status: Widowed     Family History: The patient's ***family history includes Cancer in her father; Colon cancer in her father; Coronary artery disease in her father; Diabetes in her father; Healthy in her brother, daughter, daughter, and sister; Hypothyroidism in her daughter; Other in her brother; Other (age of onset: 1) in her mother; Pneumonia in her sister. There is no history of Esophageal cancer, Kidney disease, or Liver disease.  ROS:   Please see the history of present illness.    *** All other systems reviewed and are negative.  EKGs/Labs/Other Studies Reviewed:    The following studies were reviewed today: ***  EKG:  EKG is *** ordered today.  The ekg ordered today demonstrates ***  Recent Labs: 08/26/2021: TSH 1.33 12/23/2021: B Natriuretic Peptide 874.2 12/26/2021: ALT 15; Magnesium 1.8 01/02/2022: BUN 31; Creatinine, Ser 1.37; Hemoglobin 10.0; Platelets 333.0; Potassium 4.7; Sodium 131  Recent Lipid Panel    Component Value Date/Time   CHOL 177 07/02/2016 0955   TRIG 117.0 07/02/2016 0955   HDL 66.60 07/02/2016 0955   CHOLHDL 3 07/02/2016 0955   VLDL 23.4 07/02/2016 0955   LDLCALC 87 07/02/2016 0955   LDLDIRECT 100.9 08/13/2011 0841     Risk Assessment/Calculations:   {Does this patient have ATRIAL  FIBRILLATION?:[phone]}  No BP recorded.  {Refresh Note OR Click here to enter BP  :1}***         Physical Exam:    VS:  There were no vitals taken for this visit.    Wt Readings from Last 3 Encounters:  01/15/22 104 lb (47.2 kg)  12/29/21 106 lb 8 oz (48.3 kg)  12/23/21 114 lb 14.4 oz (52.1 kg)     GEN: *** Well nourished, well developed in no acute distress HEENT: Normal NECK: No JVD; No carotid bruits LYMPHATICS: No lymphadenopathy CARDIAC: ***RRR, no murmurs, rubs, gallops RESPIRATORY:  Clear to auscultation without rales, wheezing or rhonchi  ABDOMEN: Soft, non-tender, non-distended MUSCULOSKELETAL:  No edema; No deformity  SKIN: Warm and dry NEUROLOGIC:  Alert and oriented x 3 PSYCHIATRIC:  Normal affect   ASSESSMENT:    No diagnosis found. PLAN:    In order of problems listed above:  ***      {Are you ordering a CV Procedure (e.g. stress test, cath, DCCV, TEE, etc)?   Press F2        :[id number]}    Medication Adjustments/Labs and Tests Ordered: Current medicines are reviewed at length with the patient today.  Concerns regarding medicines are outlined above.  No orders of the defined types were placed in this encounter.  No orders of the defined types were placed in this encounter.   There are no Patient Instructions on file for this visit.   Signed, Ledora Bottcher, [place]  01/22/2022 9:49 AM    [place]

## 2022-01-23 ENCOUNTER — Other Ambulatory Visit (HOSPITAL_COMMUNITY): Payer: Self-pay

## 2022-01-23 ENCOUNTER — Inpatient Hospital Stay (HOSPITAL_BASED_OUTPATIENT_CLINIC_OR_DEPARTMENT_OTHER): Payer: Medicare PPO | Admitting: Internal Medicine

## 2022-01-23 ENCOUNTER — Ambulatory Visit (HOSPITAL_BASED_OUTPATIENT_CLINIC_OR_DEPARTMENT_OTHER)
Admission: RE | Admit: 2022-01-23 | Discharge: 2022-01-23 | Disposition: A | Discharge status: Home / Self Care | Payer: Medicare PPO | Source: Ambulatory Visit | Attending: Internal Medicine | Admitting: Internal Medicine

## 2022-01-23 ENCOUNTER — Other Ambulatory Visit: Payer: Self-pay

## 2022-01-23 VITALS — BP 147/67 | HR 84 | Temp 98.1°F | Resp 16 | Wt 108.5 lb

## 2022-01-23 DIAGNOSIS — M79661 Pain in right lower leg: Secondary | ICD-10-CM | POA: Diagnosis not present

## 2022-01-23 DIAGNOSIS — Z08 Encounter for follow-up examination after completed treatment for malignant neoplasm: Secondary | ICD-10-CM | POA: Diagnosis not present

## 2022-01-23 DIAGNOSIS — Z85118 Personal history of other malignant neoplasm of bronchus and lung: Secondary | ICD-10-CM | POA: Diagnosis not present

## 2022-01-23 DIAGNOSIS — M7989 Other specified soft tissue disorders: Secondary | ICD-10-CM | POA: Insufficient documentation

## 2022-01-23 DIAGNOSIS — C3490 Malignant neoplasm of unspecified part of unspecified bronchus or lung: Secondary | ICD-10-CM

## 2022-01-23 NOTE — Progress Notes (Signed)
[name] [phone]   [phone]  OFFICE PROGRESS NOTE  Copland, Gay Filler, MD [address] [place] [address]  DIAGNOSIS: Multifocal non-small cell lung cancer, adenocarcinoma initially diagnosed as stage IIA (T2b, N0, M0) non-small cell lung cancer, adenocarcinoma with positive EGFR mutation in exon 21 ([id number]) presented with right upper lobe lung mass diagnosed in March of 2015.  PRIOR THERAPY:  1) Status post right upper lobectomy with wedge resection of the right middle lobe under the care of Dr. Servando Snare on 06/07/2013 2) Tagrisso 80 mg p.o. daily started November 19, 2021.  Status post 4 weeks of treatment.  This was discontinued secondary to intolerance and questionable drug-induced pneumonitis.  CURRENT THERAPY: Observation.  INTERVAL HISTORY: [name] 88 y.o. female returns to the clinic today for follow-up visit accompanied by her daughter [name].  The patient is feeling fine today with no concerning complaints except for swelling of the right lower extremity after having extensive physical therapy and had pain on the whole right lower extremity.  She received steroid injection in the right hip recently.  The patient denied having any current chest pain, shortness of breath, cough or hemoptysis.  She denied having any fever or chills.  She has no nausea, vomiting, diarrhea or constipation.  She was admitted to the hospital a month ago with shortness of breath and she was found to have bilateral airspace disease was highly suspicious for drug-induced pneumonitis versus pulmonary edema.  The patient was treated with a tapered dose of prednisone and she felt much better.  She discontinued her treatment with Tagrisso several weeks ago because of the suspicious pneumonitis.  She is here today for evaluation with repeat CT scan of the chest for restaging of her disease.  MEDICAL HISTORY: Past Medical History:  Diagnosis Date    Arthralgia of multiple joints    followed by dr Gerilyn Nestle   Arthritis    Cardiomyopathy ([place])    Chronic constipation    Chronic inflammatory arthritis    rhemotolgist-  dr a. Gerilyn Nestle ([facility])   Dry eyes    eye drops used    GERD (gastroesophageal reflux disease)    H/O discoid lupus erythematosus    Hiatal hernia    History of colon polyps    Hypothyroidism    Iron deficiency anemia    LBBB (left bundle branch block) 2010   Mitchell's disease (erythromelalgia) [facility])    neurologist-  dr patel   Nocturia    Non-small cell cancer of right lung [facility]) surgeon-- dr gerhardt/  oncologist-  dr Julien Nordmann--- per lov notes no recurrence/   11-18-2017 per pt denies any symptoms   dx 2015--  Stage IIA (T2b,N0,M0) , +EGFR  mutation in exon 21, non-small cell adenocarcinoma right upper lobe---  s/p  Right upper lobectomy , right middley wedge resection and node dissection---  no chemo or radiation therapy   OA (osteoarthritis)    hands   Osteoporosis    PONV (postoperative nausea and vomiting)    likes phenergan   Raynaud's phenomenon 1965   Renal insufficiency    Rheumatoid arthritis ([place])    Sciatica    Scoliosis    Sjogren's syndrome ([place])     ALLERGIES:  is allergic to amlodipine, prochlorperazine edisylate, aspirin, cymbalta [duloxetine hcl], and pamelor [nortriptyline hcl].  MEDICATIONS:  Current Outpatient Medications  Medication Sig Dispense Refill   Abatacept (ORENCIA IV) Inject into the vein  every 28 (twenty-eight) days.     Acetaminophen (TYLENOL EXTRA STRENGTH PO) Take 1-2 tablets by mouth every 6 (six) hours as needed (pain).      Biotin 1000 MCG tablet Take 1,000 mcg by mouth daily.     Calcium Carbonate-Vitamin D 500-125 MG-UNIT TABS Take 1 tablet by mouth daily.     carvedilol (COREG) 3.125 MG tablet Take 1 tablet (3.125 mg total) by mouth 2 (two) times daily with a meal. 180 tablet 3   denosumab (PROLIA) 60 MG/ML SOSY injection Inject 60 mg into the skin  every 6 (six) months.     estradiol (ESTRACE VAGINAL) 0.1 MG/GM vaginal cream Place one gram vaginally up to three times a week as needed to maintain comfort 42.5 g 12   furosemide (LASIX) 40 MG tablet Take 1 tablet (40 mg total) by mouth daily. 30 tablet 3   gabapentin (NEURONTIN) 300 MG capsule TAKE 1 CAPSULE BY MOUTH IN THE MORNING, 1 IN THE AFTERNOON, AND 2 AT BEDTIME (Patient taking differently: See admin instructions. Take one capsule (342m) by mouth in the morning, one capsule (3079m by mouth in the afternoon, and two capsules (600 mg) by mouth at bedtime) 360 capsule 1   Glucosamine-Chondroit-Vit C-Mn (GLUCOSAMINE CHONDR 1500 COMPLX PO) Take 1 capsule by mouth daily.     HYDROcodone-acetaminophen (NORCO/VICODIN) 5-325 MG tablet Take 1 tablet by mouth 2 (two) times daily as needed for moderate pain. 30 tablet 0   levothyroxine (SYNTHROID) 75 MCG tablet Take 1 tablet (75 mcg total) by mouth daily before breakfast. 90 tablet 1   loperamide (IMODIUM) 2 MG capsule Take 1 capsule (2 mg total) by mouth as needed for diarrhea or loose stools. 30 capsule 0   methocarbamol (ROBAXIN) 500 MG tablet TAKE 1 TABLET BY MOUTH EVERY 6 HOURS AS NEEDED FOR MUSCLE SPASMS. (Patient taking differently: Take 500 mg by mouth every 6 (six) hours as needed for muscle spasms.) 40 tablet 0   Multiple Vitamin (MULTIVITAMIN) tablet Take 1 tablet by mouth daily.     omeprazole (PRILOSEC) 20 MG capsule TAKE 1 CAPSULE BY MOUTH EVERY DAY (Patient taking differently: Take 20 mg by mouth daily.) 90 capsule 3   Probiotic Product (PROBIOTIC PO) Take 1 capsule by mouth daily.      sodium chloride 1 g tablet Take 1 tablet (1 g total) by mouth daily. 30 tablet 1   traMADol (ULTRAM) 50 MG tablet TAKE 0.5-1 TABLETS BY MOUTH EVERY 8 HOURS AS NEEDED. DO NOT COMBINE WITH OTHER PAIN MEDICATION 60 tablet 1   VITAMIN D PO Take by mouth daily.     No current facility-administered medications for this visit.    SURGICAL HISTORY:  Past  Surgical History:  Procedure Laterality Date   ANTERIOR HIP REVISION Right 11/27/2017   Procedure: RIGHT HIP ACETABULAR REVISION;  Surgeon: BlMcarthur RossettiMD;  [facility]: [facility];  Service: Orthopedics;  Laterality: Right;   ANTERIOR HIP REVISION Right 01/24/2018   Procedure: OPEN REDUCTION OF DISLOCATED ANTERIOR HIP WITH REVISION OF LINER AND HIP BALL;  Surgeon: BlMcarthur RossettiMD;  [facility];  Service: Orthopedics;  Laterality: Right;   APPENDECTOMY  1950s   BIOPSY  04/14/2018   Procedure: BIOPSY;  Surgeon: ArYetta FlockMD;  [place] [facility] [facility] Service: Gastroenterology;;   BIOPSY  04/16/2018   Procedure: BIOPSY;  Surgeon: MaIrving Copas MD;  [place] [facility] Service: Gastroenterology;;   CARDIOVASCULAR STRESS TEST  12/2008    mild fixed  basal to mid septal perfusion defect felt likely due to artifact from LBBB, no ischemia, EF 58%   COLONOSCOPY     COLONOSCOPY WITH PROPOFOL N/A 04/16/2018   Procedure: COLONOSCOPY WITH PROPOFOL;  Surgeon: Rush Landmark Telford Nab., MD;  Location: [place];  Service: Gastroenterology;  Laterality: N/A;   ESOPHAGOGASTRODUODENOSCOPY (EGD) WITH PROPOFOL N/A 04/14/2018   Procedure: ESOPHAGOGASTRODUODENOSCOPY (EGD) WITH PROPOFOL;  Surgeon: Yetta Flock, MD;  Location: [place];  Service: Gastroenterology;  Laterality: N/A;   FEMORAL-POPLITEAL BYPASS GRAFT Right 04/10/2018   Procedure: REPAIR RIGHT FEMORAL ARTERY PSEUDOANEURYSM, RETROPERITONEAL EXPOSURE OF ILIAC ARTERY, RIGHT POPLITEAL EMBOLECTOMY;  Surgeon: Angelia Mould, MD;  Location: [place];  Service: Vascular;  Laterality: Right;   FLEXIBLE SIGMOIDOSCOPY N/A 06/17/2019   Procedure: FLEXIBLE SIGMOIDOSCOPY;  Surgeon: Lavena Bullion, DO;  [facility];  Service: Gastroenterology;  Laterality: N/A;   HEMOSTASIS CLIP PLACEMENT  06/17/2019   Procedure: HEMOSTASIS CLIP PLACEMENT;  Surgeon: Lavena Bullion, DO;  [facility];  Service: Gastroenterology;;   LYMPH NODE DISSECTION Right 06/07/2013   Procedure: LYMPH NODE DISSECTION;  Surgeon: [name], MD;  Location: [place];  Service: Thoracic;  Laterality: Right;   PATCH ANGIOPLASTY Right 04/10/2018   Procedure: PATCH  ANGIOPLASTY OF RIGHT FEMORAL ARTERY USING BOVINE PATCH, PATCH ANGIOPLASTY OF RIGHT POPLITEAL ARTERY USING BOVINE PATCH;  Surgeon: Angelia Mould, MD;  Location: [place];  Service: Vascular;  Laterality: Right;   SCHLEROTHERAPY  06/17/2019   Procedure: [place] OF VARICES;  Surgeon: Lavena Bullion, DO;  [facility];  Service: Gastroenterology;;   [place]   "large incision from chest to up to shoulder, the nerves were tied together, for raynaud's   THORACOTOMY  06/07/2013   Procedure: MINI/LIMITED THORACOTOMY; right middle lobe wedge resection;  Surgeon: [name], MD;  Location: [place];  Service: Thoracic;;   TONSILLECTOMY  child   TOTAL ABDOMINAL HYSTERECTOMY  1980's    W/ BSO   TOTAL HIP ARTHROPLASTY Right 04/28/2014   Procedure: RIGHT TOTAL HIP ARTHROPLASTY ANTERIOR APPROACH;  Surgeon: [name], MD;  [facility];  Service: Orthopedics;  Laterality: Right;   TRANSTHORACIC ECHOCARDIOGRAM  12/11/2008   ef 40-08%, grade 1 diastolic dysfunction/  mild LAE/  mild AR and MR/  trivial TR   VIDEO ASSISTED THORACOSCOPY (VATS)/WEDGE RESECTION Right 06/07/2013   Procedure: VIDEO ASSISTED THORACOSCOPY (VATS)/right upper lobectomy, On Q;  Surgeon: [name], MD;  Location: [place];  Service: Thoracic;  Laterality: Right;   VIDEO BRONCHOSCOPY N/A 06/07/2013   Procedure: VIDEO BRONCHOSCOPY;  Surgeon: [name], MD;  Location: [place];  Service: Thoracic;  Laterality: N/A;   VIDEO BRONCHOSCOPY WITH ENDOBRONCHIAL NAVIGATION N/A 05/04/2013   Procedure: VIDEO BRONCHOSCOPY WITH ENDOBRONCHIAL NAVIGATION;  Surgeon: [name], MD;  Location: [place];  Service: Thoracic;   Laterality: N/A;    REVIEW OF SYSTEMS:  Constitutional: negative Eyes: negative Ears, nose, mouth, throat, and face: negative Respiratory: negative Cardiovascular: negative Gastrointestinal: negative Genitourinary:negative Integument/breast: negative Hematologic/lymphatic: negative Musculoskeletal:positive for arthralgias Neurological: negative Behavioral/Psych: negative Endocrine: negative Allergic/Immunologic: negative   PHYSICAL EXAMINATION: General appearance: alert, cooperative, and no distress Head: Normocephalic, without obvious abnormality, atraumatic Neck: no adenopathy, no JVD, supple, symmetrical, trachea midline, and thyroid not enlarged, symmetric, no tenderness/mass/nodules Lymph nodes: Cervical, supraclavicular, and axillary nodes normal. Resp: clear to auscultation bilaterally Back: symmetric, no curvature. ROM normal. No CVA tenderness. Cardio: regular rate and rhythm, S1, S2 normal, no murmur, click,  rub or gallop GI: soft, non-tender; bowel sounds normal; no masses,  no organomegaly Extremities: extremities normal, atraumatic, no cyanosis or edema Neurologic: Alert and oriented X 3, normal strength and tone. Normal symmetric reflexes. Normal coordination and gait  ECOG PERFORMANCE STATUS: 1 - Symptomatic but completely ambulatory  Blood pressure (!) 147/67, pulse 84, temperature 98.1 F (36.7 C), temperature source Oral, resp. rate 16, weight 108 lb 8 oz (49.2 kg), SpO2 100 %.  LABORATORY DATA: Lab Results  Component Value Date   WBC 4.1 01/22/2022   HGB 10.6 (L) 01/22/2022   HCT 32.3 (L) 01/22/2022   MCV 89.2 01/22/2022   PLT 207 01/22/2022      Chemistry      Component Value Date/Time   NA 129 (L) 01/22/2022 0932   NA 130 (L) 04/26/2021 0000   NA 130 (L) 08/21/2016 1125   K 4.6 01/22/2022 0932   K 4.8 08/21/2016 1125   CL 94 (L) 01/22/2022 0932   CO2 27 01/22/2022 0932   CO2 26 08/21/2016 1125   BUN 26 (H) 01/22/2022 0932   BUN 27  04/26/2021 0000   BUN 25.6 08/21/2016 1125   CREATININE 0.90 01/22/2022 0932   CREATININE 0.98 (H) 05/21/2020 1341   CREATININE 1.2 (H) 08/21/2016 1125      Component Value Date/Time   CALCIUM 9.7 01/22/2022 0932   CALCIUM 10.0 08/21/2016 1125   ALKPHOS 39 01/22/2022 0932   ALKPHOS 54 08/21/2016 1125   AST 26 01/22/2022 0932   AST 27 08/21/2016 1125   ALT 13 01/22/2022 0932   ALT 13 08/21/2016 1125   BILITOT 0.4 01/22/2022 0932   BILITOT 0.31 08/21/2016 1125       RADIOGRAPHIC STUDIES: DG Chest 2 View  Result Date: 01/02/2022 CLINICAL DATA:  Follow-up effusion. EXAM: CHEST - 2 VIEW COMPARISON:  Radiographs dated December 25, 2021 FINDINGS: The heart is mildly enlarged. Atherosclerotic calcification of the aortic arch. Right pleural effusion with atelectasis of the right lower lobe, unchanged. Hazy lung opacities/postsurgical changes in the right upper lobe, unchanged. Advanced bilateral glenohumeral osteoarthritis. S shaped scoliosis of the thoracolumbar spine. IMPRESSION: 1. Right pleural effusion with atelectasis of the right lower lobe, unchanged. 2. Hazy lung opacities/postsurgical changes in the right upper lobe, unchanged. Electronically Signed   By: Keane Police D.O.   On: 01/02/2022 17:00   US THORACENTESIS ASP PLEURAL SPACE W/IMG GUIDE  Result Date: 12/25/2021 INDICATION: Patient with history of non-small cell lung cancer and prior right upper lobectomy and right middle lobe wedge resection, dyspnea, CHF, bilateral pleural effusions; request received for diagnostic and therapeutic right thoracentesis. EXAM: ULTRASOUND GUIDED DIAGNOSTIC AND THERAPEUTIC RIGHT THORACENTESIS MEDICATIONS: 8 mL 1% lidocaine COMPLICATIONS: None immediate. PROCEDURE: An ultrasound guided thoracentesis was thoroughly discussed with the patient and questions answered. The benefits, risks, alternatives and complications were also discussed. The patient understands and wishes to proceed with the procedure.  Written consent was obtained. Ultrasound was performed to localize and mark an adequate pocket of fluid in the right chest. The area was then prepped and draped in the normal sterile fashion. 1% Lidocaine was used for local anesthesia. Under ultrasound guidance a 6 Fr Safe-T-Centesis catheter was introduced. Thoracentesis was performed. The catheter was removed and a dressing applied. FINDINGS: A total of approximately 800 cc of amber fluid was removed. Samples were sent to the laboratory as requested by the clinical team. IMPRESSION: Successful ultrasound guided diagnostic and therapeutic right thoracentesis yielding 800 cc of pleural fluid. Read by: Lennette Bihari  Allred, PA-C Electronically Signed   By: Ruthann Cancer M.D.   On: 12/25/2021 10:42   DG Chest 1 View  Result Date: 12/25/2021 CLINICAL DATA:  Post right thoracentesis EXAM: PORTABLE CHEST 1 VIEW COMPARISON:  12/23/2021 FINDINGS: There has been interval reduction in right-sided pleural effusion when compare with the prior exam. Some persistent scarring is noted as well as fullness in the right hilar region stable from the prior exam. Postsurgical changes are again seen. No pneumothorax is noted. Scarring in the left base is again identified. IMPRESSION: No pneumothorax following thoracentesis. Electronically Signed   By: Inez Catalina M.D.   On: 12/25/2021 10:12    ASSESSMENT AND PLAN:  This is a very pleasant 88 years old white female with multifocal bilateral adenocarcinoma initially diagnosed as stage IIA non-small cell lung cancer, adenocarcinoma with positive EGFR mutation in exon 21 status post right upper lobectomy as well as wedge resection of the right middle lobe in March 2015 and the patient declined adjuvant systemic chemotherapy. The patient had recent imaging studies that showed increase in the size and number of multifocal disease. She is currently undergoing treatment with Tagrisso 80 mg p.o. daily started on November 19, 2021, status post 1  month of treatment. The patient was tolerating her treatment well except for the hospitalization in early November 2023 with suspicious drug-induced pneumonitis and significant shortness of breath.  There was also concern about possibility of pulmonary edema.  The patient was treated with high-dose steroids and he felt much better. She had repeat CT scan of the chest performed yesterday.  The final report is still pending but I personally and independently reviewed the imaging and discussed the results and showed the images to the patient and her daughter.  Definitely there is significant improvement in the airspace disease but I will wait for the final report for confirmation of any other findings. I recommended for the patient to continue on observation with repeat CT scan of the chest in 3 months.  She is not interested in resuming her treatment with Tagrisso at this point. For the swelling of the right lower extremity, I will send the patient for Doppler of the right lower extremity to rule out deep venous thrombosis. The patient was advised to call immediately if she has any other concerning symptoms in the interval. The patient voices understanding of current disease status and treatment options and is in agreement with the current care plan. All questions were answered. The patient knows to call the clinic with any problems, questions or concerns. We can certainly see the patient much sooner if necessary. The total time spent in the appointment was 30 minutes.  Disclaimer: This note was dictated with voice recognition software. Similar sounding words can inadvertently be transcribed and may not be corrected upon review.

## 2022-01-23 NOTE — Progress Notes (Addendum)
Lower extremity venous duplex has been completed.   Preliminary results in CV Proc.   Attempted to call results, no answer.   [name] 01/23/2022 11:44 AM

## 2022-01-23 NOTE — Progress Notes (Signed)
Right lower leg red with heat. Patient thinks she may have a blot clot. Dr. Julien Nordmann aware.

## 2022-01-24 ENCOUNTER — Telehealth: Payer: Self-pay | Admitting: Medical Oncology

## 2022-01-24 ENCOUNTER — Other Ambulatory Visit (HOSPITAL_COMMUNITY): Payer: Self-pay

## 2022-01-24 ENCOUNTER — Other Ambulatory Visit: Payer: Self-pay | Admitting: Family Medicine

## 2022-01-24 ENCOUNTER — Other Ambulatory Visit: Payer: Self-pay

## 2022-01-24 ENCOUNTER — Other Ambulatory Visit: Payer: Self-pay | Admitting: Orthopaedic Surgery

## 2022-01-24 ENCOUNTER — Other Ambulatory Visit: Payer: Self-pay | Admitting: Medical Oncology

## 2022-01-24 DIAGNOSIS — M7989 Other specified soft tissue disorders: Secondary | ICD-10-CM

## 2022-01-24 NOTE — Telephone Encounter (Signed)
Confusion -Dtr reports specific examples of confusion and dates. Confused about dates on calendar  and knowing the difference between 40 and 4000.   Please call dtr if an appt for a scan is ordered.

## 2022-01-24 NOTE — Telephone Encounter (Signed)
[name] said pt had Doppler yesterday

## 2022-01-27 ENCOUNTER — Ambulatory Visit (HOSPITAL_COMMUNITY): Payer: Medicare PPO

## 2022-01-27 ENCOUNTER — Telehealth: Payer: Self-pay | Admitting: Medical Oncology

## 2022-01-27 NOTE — Telephone Encounter (Signed)
For the confusion , Dr. Julien Nordmann said he will be happy to order MRI of the brain or she can be referred to neurology.

## 2022-01-27 NOTE — Telephone Encounter (Signed)
[name] informed of [employer] . She wants to hold off on MRI or neurology consult. She will contact pt PCP regarding Raylan's poor memory /confusion and her increased use of hydrocodone.

## 2022-01-29 ENCOUNTER — Ambulatory Visit: Payer: Medicare PPO | Attending: Physician Assistant | Admitting: Physician Assistant

## 2022-01-29 ENCOUNTER — Encounter: Payer: Self-pay | Admitting: Physician Assistant

## 2022-01-29 VITALS — BP 116/60 | HR 72 | Ht 60.0 in | Wt 106.0 lb

## 2022-01-29 DIAGNOSIS — I340 Nonrheumatic mitral (valve) insufficiency: Secondary | ICD-10-CM | POA: Diagnosis not present

## 2022-01-29 DIAGNOSIS — I420 Dilated cardiomyopathy: Secondary | ICD-10-CM

## 2022-01-29 DIAGNOSIS — N1831 Chronic kidney disease, stage 3a: Secondary | ICD-10-CM

## 2022-01-29 DIAGNOSIS — C3411 Malignant neoplasm of upper lobe, right bronchus or lung: Secondary | ICD-10-CM

## 2022-01-29 DIAGNOSIS — I5040 Unspecified combined systolic (congestive) and diastolic (congestive) heart failure: Secondary | ICD-10-CM | POA: Diagnosis not present

## 2022-01-29 NOTE — Patient Instructions (Signed)
Medication Instructions:  No Changes *If you need a refill on your cardiac medications before your next appointment, please call your pharmacy*   Lab Work: No Labs If you have labs (blood work) drawn today and your tests are completely normal, you will receive your results only by: [place] (if you have MyChart) OR A paper copy in the mail If you have any lab test that is abnormal or we need to change your treatment, we will call you to review the results.   Testing/Procedures: No Testing   Follow-Up: At [facility], you and your health needs are our priority.  As part of our continuing mission to provide you with exceptional heart care, we have created designated Provider Care Teams.  These Care Teams include your primary Cardiologist (physician) and Advanced Practice Providers (APPs -  Physician Assistants and Nurse Practitioners) who all work together to provide you with the care you need, when you need it.  We recommend signing up for the patient portal called "MyChart".  Sign up information is provided on this After Visit Summary.  MyChart is used to connect with patients for Virtual Visits (Telemedicine).  Patients are able to view lab/test results, encounter notes, upcoming appointments, etc.  Non-urgent messages can be sent to your provider as well.   To learn more about what you can do with MyChart, go to [email or web address].    Your next appointment:   3 month(s)  The format for your next appointment:   In Person  Provider:   Kirk Ruths, MD

## 2022-01-30 ENCOUNTER — Other Ambulatory Visit: Payer: Self-pay

## 2022-01-30 ENCOUNTER — Telehealth: Payer: Self-pay | Admitting: Medical Oncology

## 2022-01-30 NOTE — Telephone Encounter (Deleted)
[name] notified of Mohamed's recommendation. [name] said she will discuss memory issues with pts PCP before doing anything else.

## 2022-01-30 NOTE — Telephone Encounter (Signed)
err

## 2022-02-11 MED ORDER — HYDROCODONE-ACETAMINOPHEN 5-325 MG PO TABS: 1.0000 | ORAL_TABLET | Freq: Two times a day (BID) | ORAL | 0 refills | Status: DC | PRN

## 2022-02-11 NOTE — Addendum Note (Signed)
Addended by: Lamar Blinks C on: 02/11/2022 01:19 PM   Modules accepted: Orders

## 2022-02-12 ENCOUNTER — Other Ambulatory Visit: Payer: Self-pay | Admitting: Pharmacist

## 2022-02-12 ENCOUNTER — Ambulatory Visit (HOSPITAL_COMMUNITY)
Admission: RE | Admit: 2022-02-12 | Discharge: 2022-02-12 | Disposition: A | Discharge status: Home / Self Care | Payer: Medicare PPO | Source: Ambulatory Visit | Attending: Rheumatology | Admitting: Rheumatology

## 2022-02-12 DIAGNOSIS — M0609 Rheumatoid arthritis without rheumatoid factor, multiple sites: Secondary | ICD-10-CM

## 2022-02-12 DIAGNOSIS — Z79899 Other long term (current) drug therapy: Secondary | ICD-10-CM

## 2022-02-12 MED ORDER — DIPHENHYDRAMINE HCL 25 MG PO CAPS: 25.0000 mg | ORAL_CAPSULE | ORAL | Status: DC

## 2022-02-12 MED ORDER — SODIUM CHLORIDE 0.9 % IV SOLN
500.0000 mg | INTRAVENOUS | Status: DC
  Administered 2022-02-12: 500 mg via INTRAVENOUS
  Filled 2022-02-12: qty 20

## 2022-02-12 MED ORDER — ACETAMINOPHEN 325 MG PO TABS: 650.0000 mg | ORAL_TABLET | ORAL | Status: DC

## 2022-02-12 NOTE — Progress Notes (Signed)
Next infusion scheduled for Orencia IV on 03/12/2022 and due for updated orders. Diagnosis: RA  Dose: 500mg  every 28 days (appropriate based on last recorded weight of 46.7kg)  Last Clinic Visit: 10/16/2021 Next Clinic Visit: not scheduled and overdue  Last infusion: 02/12/2022  Labs: CBC and CMP on 01/22/2022 TB Gold: negative on 07/10/2021   Orders placed for Orencia IV x 3 doses along with premedication of acetaminophen and diphenhydramine to be administered 30 minutes before medication infusion.  Standing CBC with diff/platelet and CMP with GFR orders placed to be drawn every 2 months.  Next TB gold due 07/11/2022.  Patient overdue for f/u appointment. Message sent to scheduling team.  07/13/2022, PharmD, MPH, BCPS, CPP Clinical Pharmacist (Rheumatology and Pulmonology)

## 2022-02-13 ENCOUNTER — Telehealth: Payer: Self-pay | Admitting: Pharmacist

## 2022-02-13 ENCOUNTER — Telehealth: Payer: Self-pay | Admitting: Orthopaedic Surgery

## 2022-02-13 NOTE — Telephone Encounter (Signed)
Received notification from [facility] regarding a prior authorization for [facility] IV. Authorization has been APPROVED from 02/17/2021 to 02/17/2023. Approval letter sent to scan center.  Authorization # [id number] Phone # [phone]  Knox Saliva, PharmD, MPH, BCPS, CPP Clinical Pharmacist (Rheumatology and Pulmonology)

## 2022-02-13 NOTE — Telephone Encounter (Signed)
Patient states she needs a cortisone injection in her hip. She advised Dr. Lorelei Pont referred her..please advise..[phone].. patient states she dosnt have a referral and the last time she came in she ws given a lidocaine injection.ibuprofen am going to make her an appt . You dont have to call.Marland Kitchen

## 2022-02-13 NOTE — Telephone Encounter (Signed)
Spoke with patient. She confirmed she will be keeping her [facility] Plan in 2024. Submitted PA renewal via [employer] for IV ORENCIA.  Orencia - [id number], [id number] Dose: 500mg  every 28 days (appropriate based on last recorded weight of 46.7kg)  Key: [id number]  Knox Saliva, PharmD, MPH, BCPS, CPP Clinical Pharmacist (Rheumatology and Pulmonology)

## 2022-02-21 ENCOUNTER — Encounter: Payer: Self-pay | Admitting: Physician Assistant

## 2022-02-21 ENCOUNTER — Ambulatory Visit (INDEPENDENT_AMBULATORY_CARE_PROVIDER_SITE_OTHER): Payer: Medicare PPO

## 2022-02-21 ENCOUNTER — Ambulatory Visit: Payer: Medicare PPO | Admitting: Physician Assistant

## 2022-02-21 DIAGNOSIS — M5431 Sciatica, right side: Secondary | ICD-10-CM | POA: Diagnosis not present

## 2022-02-21 NOTE — Progress Notes (Signed)
Office Visit Note   Patient: [name]           Date of Birth: [birth year]           MRN: [id number] Visit Date: 02/21/2022              Requested by: Darreld Mclean, MD [address] [place],  [place] [id number] PCP: Darreld Mclean, MD  Chief Complaint  Patient presents with   Right Hip - Pain      HPI: [name] is a pleasant 89 year old patient of Dr. Trevor Mace.  She was last seen in early December for right trochanteric bursitis.  She did have an injection with Gill in the trochanteric bursa.  She says she does not think she got much relief from it.  She does say she has a history of "sciatica ".  She describes her pain today as going down her right buttock down all the way to her ankle.  Denies any change in bowel or bladder habits.  She recalls over the years that she has had injections into her back that were not particularly helpful.  She also was given brace at the spine center but found it uncomfortable.  She does use heat which seems to help a little bit  Assessment & Plan: Visit Diagnoses:  1. Sciatica, right side     Plan: Problems today seem to be more related to her back.  She does have some tenderness to percussion over the lower back.  She has severe degenerative changes and listhesis on x-ray.  Difficult to discern any integrity of the vertebral bodies because of bowel.  She is neurologically intact.  Would recommend an MRI to rule out any type of acute impingement or compression fracture.  If she develops any weakness loss of bowel or bladder control she is to contact us immediately  Follow-Up Instructions: Return After MRI.   Ortho Exam  Patient is alert, oriented, no adenopathy, well-dressed, normal affect, normal respiratory effort. Pleasant 88 year old woman using a walker.  She has mild straight leg raise recreates her symptoms in the back on the right side none on the left.  She has strong dorsiflexion plantarflexion resisted flexion  and extension of her legs.  Sensation is intact.  To percussion she is tender to palpation over the lower part of the back there is no redness or erythema.  Imaging: No results found. No images are attached to the encounter.  Labs: Lab Results  Component Value Date   HGBA1C 5.3 04/15/2018   ESRSEDRATE 12 10/18/2015   ESRSEDRATE 6 09/24/2006   CRP <0.1 (L) 10/18/2015   REPTSTATUS 12/30/2021 FINAL 12/25/2021   REPTSTATUS 12/25/2021 FINAL 12/25/2021   GRAMSTAIN  12/25/2021    WBC PRESENT,BOTH PMN AND MONONUCLEAR NO ORGANISMS SEEN CYTOSPIN SMEAR Performed at [facility], [place] [address], [place], [place] [address]    CULT  12/25/2021    NO GROWTH 5 DAYS Performed at [facility] [address] [place], [place] [address]    LABORGA NO GROWTH 10/01/2011     Lab Results  Component Value Date   ALBUMIN 4.3 01/22/2022   ALBUMIN 2.8 (L) 12/26/2021   ALBUMIN 4.1 12/23/2021    Lab Results  Component Value Date   MG 1.8 12/26/2021   MG 2.0 12/25/2021   MG 1.8 06/18/2019   Lab Results  Component Value Date   VD25OH 60.77 06/12/2021   VD25OH 63.17 11/21/2020   VD25OH  40.13 07/02/2016    No results found for: "PREALBUMIN"    Latest Ref Rng & Units 01/22/2022    9:32 AM 01/02/2022    4:25 PM 12/29/2021    4:18 AM  CBC EXTENDED  WBC 4.0 - 10.5 K/uL 4.1  5.5  4.6   RBC 3.87 - 5.11 MIL/uL 3.62  3.34  3.29   Hemoglobin 12.0 - 15.0 g/dL 10.6  10.0  9.7   HCT 36.0 - 46.0 % 32.3  29.8  30.0   Platelets 150 - 400 K/uL 207  333.0  246   NEUT# 1.7 - 7.7 K/uL 2.7     Lymph# 0.7 - 4.0 K/uL 0.8        There is no height or weight on file to calculate BMI.  Orders:  Orders Placed This Encounter  Procedures   XR Lumbar Spine 2-3 Views   MR Lumbar Spine w/o contrast   No orders of the defined types were placed in this encounter.    Procedures: No procedures performed  Clinical Data: No additional findings.  ROS:  All other systems negative, except as  noted in the HPI. Review of Systems  Objective: Vital Signs: There were no vitals taken for this visit.  Specialty Comments:  No specialty comments available.  PMFS History: Patient Active Problem List   Diagnosis Date Noted   Acute congestive heart failure ([place]) 12/29/2021   Acute respiratory failure ([place]) 12/24/2021   Acute respiratory failure with hypoxia ([place]) 12/23/2021   Acute on chronic combined systolic and diastolic CHF (congestive heart failure) ([place]) 12/23/2021   Rectal pain 10/10/2020   Acute posthemorrhagic anemia    Rectal bleeding 06/17/2019   Grade II internal hemorrhoids    Rectal ulcer    Popliteal artery occlusion, right ([place]) 04/10/2018   HTN (hypertension) 04/10/2018   Femoral artery pseudo-aneurysm, right (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   Status post revision of total hip 11/27/2017   Elevated cholesterol 10/11/2015   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[place]) 10/04/2014   Bilateral leg edema 08/01/2014   Elevated BP 08/01/2014   Rheumatoid arthritis involving multiple joints ([place]) 05/30/2014   Status post total replacement of right hip 04/28/2014   Long-term use of high-risk medication 11/11/2013   Symptomatic anemia 11/11/2013   Neuropathic pain 07/07/2013   Constipation due to pain medication 07/07/2013   Protein-calorie malnutrition, severe ([place]) 06/08/2013   Lung cancer, Right upper lobe 05/08/2013   Sciatica of right side 08/13/2011   Osteoporosis 03/07/2010   PARESTHESIA 03/07/2010   Low back pain 03/16/2009   CT, CHEST, ABNORMAL 12/18/2008   ABNORMAL ECHOCARDIOGRAM 12/14/2008   SJOGREN'S SYNDROME 11/29/2008   HYPOGLYCEMIA 06/29/2006   RAYNAUD'S DISEASE 06/29/2006   Past Medical History:   Diagnosis Date   Arthralgia of multiple joints    followed by dr Gerilyn Nestle   Arthritis    Cardiomyopathy ([place])    Chronic constipation    Chronic inflammatory arthritis    rhemotolgist-  dr a. Gerilyn Nestle ([facility])   Dry eyes    eye drops used    GERD (gastroesophageal reflux disease)    H/O discoid lupus erythematosus    Hiatal hernia    History of colon polyps    Hypothyroidism  Iron deficiency anemia    LBBB (left bundle branch block) 2010   Mitchell's disease (erythromelalgia) [facility])    neurologist-  dr patel   Nocturia    Non-small cell cancer of right lung [facility]) surgeon-- dr gerhardt/  oncologist-  dr Julien Nordmann--- per lov notes no recurrence/   11-18-2017 per pt denies any symptoms   dx 2015--  Stage IIA (T2b,N0,M0) , +EGFR  mutation in exon 21, non-small cell adenocarcinoma right upper lobe---  s/p  Right upper lobectomy , right middley wedge resection and node dissection---  no chemo or radiation therapy   OA (osteoarthritis)    hands   Osteoporosis    PONV (postoperative nausea and vomiting)    likes phenergan   Raynaud's phenomenon 1965   Renal insufficiency    Rheumatoid arthritis ([place])    Sciatica    Scoliosis    Sjogren's syndrome ([place])     Family History  Problem Relation Age of Onset   Coronary artery disease Father    Colon cancer Father    Diabetes Father    Cancer Father        colon   Other Mother 74       MVA   Healthy Sister    Healthy Brother    Healthy Daughter    Hypothyroidism Daughter    Other Brother        killed in war   Pneumonia Sister    Healthy Daughter    Esophageal cancer Neg Hx    Kidney disease Neg Hx    Liver disease Neg Hx     Past Surgical History:  Procedure Laterality Date   ANTERIOR HIP REVISION Right 11/27/2017   Procedure: RIGHT HIP ACETABULAR REVISION;  Surgeon: [name], MD;  Location: [facility];  Service: Orthopedics;  Laterality: Right;   ANTERIOR HIP REVISION Right 01/24/2018   Procedure:  OPEN REDUCTION OF DISLOCATED ANTERIOR HIP WITH REVISION OF LINER AND HIP BALL;  Surgeon: [name], MD;  Location: [facility];  Service: Orthopedics;  Laterality: Right;   APPENDECTOMY  1950s   BIOPSY  04/14/2018   Procedure: BIOPSY;  Surgeon: Yetta Flock, MD;  Location: [facility];  Service: Gastroenterology;;   BIOPSY  04/16/2018   Procedure: BIOPSY;  Surgeon: Irving Copas., MD;  Location: [facility];  Service: Gastroenterology;;   CARDIOVASCULAR STRESS TEST  12/2008    mild fixed basal to mid septal perfusion defect felt likely due to artifact from LBBB, no ischemia, EF 58%   COLONOSCOPY     COLONOSCOPY WITH PROPOFOL N/A 04/16/2018   Procedure: COLONOSCOPY WITH PROPOFOL;  Surgeon: Irving Copas., MD;  Location: [place];  Service: Gastroenterology;  Laterality: N/A;   ESOPHAGOGASTRODUODENOSCOPY (EGD) WITH PROPOFOL N/A 04/14/2018   Procedure: ESOPHAGOGASTRODUODENOSCOPY (EGD) WITH PROPOFOL;  Surgeon: Yetta Flock, MD;  Location: [place];  Service: Gastroenterology;  Laterality: N/A;   FEMORAL-POPLITEAL BYPASS GRAFT Right 04/10/2018   Procedure: REPAIR RIGHT FEMORAL ARTERY PSEUDOANEURYSM, RETROPERITONEAL EXPOSURE OF ILIAC ARTERY, RIGHT POPLITEAL EMBOLECTOMY;  Surgeon: Angelia Mould, MD;  Location: [facility];  Service: Vascular;  Laterality: Right;   FLEXIBLE SIGMOIDOSCOPY N/A 06/17/2019   Procedure: FLEXIBLE SIGMOIDOSCOPY;  Surgeon: Lavena Bullion, DO;  [facility];  Service: Gastroenterology;  Laterality: N/A;   HEMOSTASIS CLIP PLACEMENT  06/17/2019   Procedure: HEMOSTASIS CLIP PLACEMENT;  Surgeon: Lavena Bullion, DO;  [facility];  Service: Gastroenterology;;   LYMPH NODE DISSECTION Right 06/07/2013   Procedure: LYMPH NODE DISSECTION;  Surgeon: [name], MD;  Location: [place];  Service: Thoracic;  Laterality: Right;   PATCH ANGIOPLASTY Right 04/10/2018   Procedure: PATCH  ANGIOPLASTY OF RIGHT FEMORAL  ARTERY USING BOVINE PATCH, PATCH ANGIOPLASTY OF RIGHT POPLITEAL ARTERY USING BOVINE PATCH;  Surgeon: Angelia Mould, MD;  Location: [place];  Service: Vascular;  Laterality: Right;   SCHLEROTHERAPY  06/17/2019   Procedure: [place]ward Ku OF VARICES;  Surgeon: Lavena Bullion, DO;  [facility];  Service: Gastroenterology;;   [place]   "large incision from chest to up to shoulder, the nerves were tied together, for raynaud's   THORACOTOMY  06/07/2013   Procedure: MINI/LIMITED THORACOTOMY; right middle lobe wedge resection;  Surgeon: [name], MD;  Location: [place];  Service: Thoracic;;   TONSILLECTOMY  child   TOTAL ABDOMINAL HYSTERECTOMY  1980's    W/ BSO   TOTAL HIP ARTHROPLASTY Right 04/28/2014   Procedure: RIGHT TOTAL HIP ARTHROPLASTY ANTERIOR APPROACH;  Surgeon: [name], MD;  [facility];  Service: Orthopedics;  Laterality: Right;   TRANSTHORACIC ECHOCARDIOGRAM  12/11/2008   ef 86-57%, grade 1 diastolic dysfunction/  mild LAE/  mild AR and MR/  trivial TR   VIDEO ASSISTED THORACOSCOPY (VATS)/WEDGE RESECTION Right 06/07/2013   Procedure: VIDEO ASSISTED THORACOSCOPY (VATS)/right upper lobectomy, On Q;  Surgeon: [name], MD;  Location: [place];  Service: Thoracic;  Laterality: Right;   VIDEO BRONCHOSCOPY N/A 06/07/2013   Procedure: VIDEO BRONCHOSCOPY;  Surgeon: [name], MD;  [facility];  Service: Thoracic;  Laterality: N/A;   VIDEO BRONCHOSCOPY WITH ENDOBRONCHIAL NAVIGATION N/A 05/04/2013   Procedure: VIDEO BRONCHOSCOPY WITH ENDOBRONCHIAL NAVIGATION;  Surgeon: [name], MD;  Location: [place];  Service: Thoracic;  Laterality: N/A;   Social History   Occupational History   Occupation: n/a  Tobacco Use   Smoking status: Never    Passive exposure: Past   Smokeless tobacco: Never  Vaping Use   Vaping Use: Never used  Substance and Sexual Activity   Alcohol use: Not Currently   Drug use: Never    Sexual activity: Not Currently    Birth control/protection: Surgical

## 2022-03-02 ENCOUNTER — Other Ambulatory Visit: Payer: Self-pay | Admitting: Family Medicine

## 2022-03-02 DIAGNOSIS — M25512 Pain in left shoulder: Secondary | ICD-10-CM

## 2022-03-03 NOTE — Telephone Encounter (Signed)
Requesting: Tramadol  Contract: N/A UDS: N/A Last Visit: 01/02/2022 Next Visit: N/A Last Refill: 01/12/2022  Please Advise

## 2022-03-08 ENCOUNTER — Ambulatory Visit (HOSPITAL_BASED_OUTPATIENT_CLINIC_OR_DEPARTMENT_OTHER)
Admission: RE | Admit: 2022-03-08 | Discharge: 2022-03-08 | Disposition: A | Discharge status: Home / Self Care | Payer: Medicare PPO | Source: Ambulatory Visit | Attending: Physician Assistant | Admitting: Physician Assistant

## 2022-03-08 DIAGNOSIS — M5431 Sciatica, right side: Secondary | ICD-10-CM | POA: Diagnosis not present

## 2022-03-08 DIAGNOSIS — M5126 Other intervertebral disc displacement, lumbar region: Secondary | ICD-10-CM | POA: Diagnosis not present

## 2022-03-11 ENCOUNTER — Telehealth: Payer: Self-pay | Admitting: Rheumatology

## 2022-03-11 ENCOUNTER — Other Ambulatory Visit: Payer: Self-pay | Admitting: Physician Assistant

## 2022-03-11 DIAGNOSIS — M5440 Lumbago with sciatica, unspecified side: Secondary | ICD-10-CM

## 2022-03-11 NOTE — Telephone Encounter (Signed)
LMOM for patient to call and schedule follow-up appointment.   °

## 2022-03-11 NOTE — Telephone Encounter (Signed)
-----  Message from [facility], RPH-CPP sent at 03/11/2022  8:32 AM EST ----- Regarding: F/u appt overdue Please call pt for f/u appointment. Receives infsuion and was due in Nov 2023.  Thanks! Devki

## 2022-03-12 ENCOUNTER — Ambulatory Visit (HOSPITAL_COMMUNITY)
Admission: RE | Admit: 2022-03-12 | Discharge: 2022-03-12 | Disposition: A | Discharge status: Home / Self Care | Payer: Medicare PPO | Source: Ambulatory Visit | Attending: Rheumatology | Admitting: Rheumatology

## 2022-03-12 DIAGNOSIS — Z79899 Other long term (current) drug therapy: Secondary | ICD-10-CM | POA: Insufficient documentation

## 2022-03-12 DIAGNOSIS — M0609 Rheumatoid arthritis without rheumatoid factor, multiple sites: Secondary | ICD-10-CM | POA: Diagnosis not present

## 2022-03-12 LAB — COMPREHENSIVE METABOLIC PANEL
ALT: 14 U/L (ref 0–44)
AST: 31 U/L (ref 15–41)
Albumin: 4 g/dL (ref 3.5–5.0)
Alkaline Phosphatase: 36 U/L — ABNORMAL LOW (ref 38–126)
Anion gap: 8 (ref 5–15)
BUN: 26 mg/dL — ABNORMAL HIGH (ref 8–23)
CO2: 28 mmol/L (ref 22–32)
Calcium: 9 mg/dL (ref 8.9–10.3)
Chloride: 94 mmol/L — ABNORMAL LOW (ref 98–111)
Creatinine, Ser: 1.01 mg/dL — ABNORMAL HIGH (ref 0.44–1.00)
GFR, Estimated: 53 mL/min — ABNORMAL LOW (ref >60)
Glucose, Bld: 81 mg/dL (ref 70–99)
Potassium: 4.2 mmol/L (ref 3.5–5.1)
Sodium: 130 mmol/L — ABNORMAL LOW (ref 135–145)
Total Bilirubin: 0.5 mg/dL (ref 0.3–1.2)
Total Protein: 6.8 g/dL (ref 6.5–8.1)

## 2022-03-12 LAB — CBC WITH DIFFERENTIAL/PLATELET
Abs Immature Granulocytes: 0.01 10*3/uL (ref 0.00–0.07)
Basophils Absolute: 0 10*3/uL (ref 0.0–0.1)
Basophils Relative: 1 %
Eosinophils Absolute: 0 10*3/uL (ref 0.0–0.5)
Eosinophils Relative: 1 %
HCT: 35.6 % — ABNORMAL LOW (ref 36.0–46.0)
Hemoglobin: 11.6 g/dL — ABNORMAL LOW (ref 12.0–15.0)
Immature Granulocytes: 0 %
Lymphocytes Relative: 23 %
Lymphs Abs: 0.9 10*3/uL (ref 0.7–4.0)
MCH: 29.2 pg (ref 26.0–34.0)
MCHC: 32.6 g/dL (ref 30.0–36.0)
MCV: 89.7 fL (ref 80.0–100.0)
Monocytes Absolute: 0.5 10*3/uL (ref 0.1–1.0)
Monocytes Relative: 12 %
Neutro Abs: 2.6 10*3/uL (ref 1.7–7.7)
Neutrophils Relative %: 63 %
Platelets: 205 10*3/uL (ref 150–400)
RBC: 3.97 MIL/uL (ref 3.87–5.11)
RDW: 13.7 % (ref 11.5–15.5)
WBC: 4 10*3/uL (ref 4.0–10.5)
nRBC: 0 % (ref 0.0–0.2)

## 2022-03-12 MED ORDER — SODIUM CHLORIDE 0.9 % IV SOLN
500.0000 mg | INTRAVENOUS | Status: DC
  Administered 2022-03-12: 500 mg via INTRAVENOUS
  Filled 2022-03-12: qty 20

## 2022-03-12 MED ORDER — ACETAMINOPHEN 325 MG PO TABS: 650.0000 mg | ORAL_TABLET | ORAL | Status: DC

## 2022-03-12 MED ORDER — DIPHENHYDRAMINE HCL 25 MG PO CAPS: 25.0000 mg | ORAL_CAPSULE | ORAL | Status: DC

## 2022-03-12 NOTE — Progress Notes (Signed)
Anemia has improved.  CMP stable.  Avoid NSAID use.

## 2022-03-18 NOTE — Progress Notes (Addendum)
Office Visit Note  Patient: [name]             Date of Birth: [birth year]           MRN: [id number]             PCP: Darreld Mclean, MD Referring: Darreld Mclean, MD Visit Date: 04/01/2022 Occupation: @GUAROCC @  Subjective:  Medication management  History of Present Illness: [name] is a 89 y.o. female with history of seronegative rheumatoid arthritis, osteoporosis, sicca syndrome. She is on Orencia every 28 days for rheumatoid arthritis. She has not missed any doses of the medication. She has history of UTIs but has not had one recently or any other infections. States her fingers, shoulders and feet are painful at this time. She occasionally has joint swelling. For pain, she uses extra strength tylenol and biofreeze as needed. Overall, she rates her rheumatoid arthritis as a 6/10 today. She has osteoporosis and receives Prolia every 6 months. Last DEXA was on 01/2019. She has not missed doses of Prolia. She ambulates with a cane and walker and has not had any recent falls. She has Raynaud's, predominately of the left hand with her fingers turning white or blue. She wears gloves and uses hot water as needed. No dry mouth or dry eyes at this time.    Activities of Daily Living:  Patient reports morning stiffness for 0 minutes.   Patient Denies nocturnal pain.  Difficulty dressing/grooming: Reports Difficulty climbing stairs: Reports Difficulty getting out of chair: Reports Difficulty using hands for taps, buttons, cutlery, and/or writing: Reports  Review of Systems  Constitutional:  Positive for fatigue.  HENT: Negative.  Negative for mouth sores and mouth dryness.   Eyes:  Positive for dryness.  Respiratory: Negative.  Negative for shortness of breath.   Cardiovascular: Negative.  Negative for chest pain and palpitations.  Gastrointestinal: Negative.  Negative for blood in stool, constipation and diarrhea.  Endocrine: Negative.  Negative for increased  urination.  Genitourinary: Negative.  Negative for involuntary urination.  Musculoskeletal:  Positive for joint pain, gait problem, joint pain and joint swelling. Negative for myalgias, muscle weakness, morning stiffness, muscle tenderness and myalgias.  Skin:  Positive for hair loss. Negative for color change, rash and sensitivity to sunlight.  Allergic/Immunologic: Negative.  Negative for susceptible to infections.  Neurological:  Negative for dizziness and headaches.  Hematological: Negative.  Negative for swollen glands.  Psychiatric/Behavioral: Negative.  Negative for depressed mood and sleep disturbance. The patient is not nervous/anxious.     PMFS History:  Patient Active Problem List   Diagnosis Date Noted   Acute congestive heart failure ([place]) 12/29/2021   Acute respiratory failure ([place]) 12/24/2021   Acute respiratory failure with hypoxia ([place]) 12/23/2021   Acute on chronic combined systolic and diastolic CHF (congestive heart failure) ([place]) 12/23/2021   Rectal pain 10/10/2020   Acute posthemorrhagic anemia    Rectal bleeding 06/17/2019   Grade II internal hemorrhoids    Rectal ulcer    Popliteal artery occlusion, right ([place]) 04/10/2018   HTN (hypertension) 04/10/2018   Femoral artery pseudo-aneurysm, right ([place]) 04/09/2018   Anemia of chronic disease 02/24/2018   Unstable right hip arthroplasty 01/24/2018   History of revision of total replacement of right hip joint 01/24/2018   Hypovolemic shock (HCC)    Hyperkalemia    Hyponatremia    Failed total hip arthroplasty ([place]) 11/27/2017   Status post revision of total hip 11/27/2017   Elevated cholesterol  10/11/2015   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[place]) 10/04/2014   Bilateral leg edema 08/01/2014   Elevated BP 08/01/2014   Rheumatoid arthritis involving multiple joints ([place]) 05/30/2014   Status post total replacement of right hip 04/28/2014   Long-term use of high-risk medication 11/11/2013   Symptomatic anemia 11/11/2013    Neuropathic pain 07/07/2013   Constipation due to pain medication 07/07/2013   Protein-calorie malnutrition, severe ([place]) 06/08/2013   Lung cancer, Right upper lobe 05/08/2013   Sciatica of right side 08/13/2011   Osteoporosis 03/07/2010   PARESTHESIA 03/07/2010   Low back pain 03/16/2009   CT, CHEST, ABNORMAL 12/18/2008   ABNORMAL ECHOCARDIOGRAM 12/14/2008   SJOGREN'S SYNDROME 11/29/2008   HYPOGLYCEMIA 06/29/2006   RAYNAUD'S DISEASE 06/29/2006    Past Medical History:  Diagnosis Date   Arthralgia of multiple joints    followed by dr Gerilyn Nestle   Arthritis    Cardiomyopathy ([place])    Chronic constipation    Chronic inflammatory arthritis    rhemotolgist-  dr a. Gerilyn Nestle ([facility])   Dry eyes    eye drops used    GERD (gastroesophageal reflux disease)    H/O discoid lupus erythematosus    Hiatal hernia    History of colon polyps    Hypothyroidism    Iron deficiency anemia    LBBB (left bundle branch block) 2010   Mitchell's disease (erythromelalgia) [facility])    neurologist-  dr patel   Nocturia    Non-small cell cancer of right lung [facility]) surgeon-- dr gerhardt/  oncologist-  dr Julien Nordmann--- per lov notes no recurrence/   11-18-2017 per pt denies any symptoms   dx 2015--  Stage IIA (T2b,N0,M0) , +EGFR  mutation in exon 21, non-small cell adenocarcinoma right upper lobe---  s/p  Right upper lobectomy , right middley wedge resection and node dissection---  no chemo or radiation therapy   OA (osteoarthritis)    hands   Osteoporosis    PONV (postoperative nausea and vomiting)    likes phenergan   Raynaud's phenomenon 1965   Renal insufficiency    Rheumatoid arthritis ([place])    Sciatica    Scoliosis    Sjogren's syndrome ([place])     Family History  Problem Relation Age of Onset   Coronary artery disease Father    Colon cancer Father    Diabetes Father    Cancer Father        colon   Other Mother 60       MVA   Healthy Sister    Healthy Brother    Healthy  Daughter    Hypothyroidism Daughter    Other Brother        killed in war   Pneumonia Sister    Healthy Daughter    Esophageal cancer Neg Hx    Kidney disease Neg Hx    Liver disease Neg Hx    Past Surgical History:  Procedure Laterality Date   ANTERIOR HIP REVISION Right 11/27/2017   Procedure: RIGHT HIP ACETABULAR REVISION;  Surgeon: [name], MD;  Location: [facility];  Service: Orthopedics;  Laterality: Right;   ANTERIOR HIP REVISION Right 01/24/2018   Procedure: OPEN REDUCTION OF DISLOCATED ANTERIOR HIP WITH REVISION OF LINER AND HIP BALL;  Surgeon: [name], MD;  [facility]: [facility];  Service: Orthopedics;  Laterality: Right;   APPENDECTOMY  1950s   BIOPSY  04/14/2018   Procedure: BIOPSY;  Surgeon: Yetta Flock, MD;  Location: [place];  Service: Gastroenterology;;  BIOPSY  04/16/2018   Procedure: BIOPSY;  Surgeon: Irving Copas., MD;  Location: [place];  Service: Gastroenterology;;   CARDIOVASCULAR STRESS TEST  12/2008    mild fixed basal to mid septal perfusion defect felt likely due to artifact from LBBB, no ischemia, EF 58%   COLONOSCOPY     COLONOSCOPY WITH PROPOFOL N/A 04/16/2018   Procedure: COLONOSCOPY WITH PROPOFOL;  Surgeon: Irving Copas., MD;  Location: [place];  Service: Gastroenterology;  Laterality: N/A;   ESOPHAGOGASTRODUODENOSCOPY (EGD) WITH PROPOFOL N/A 04/14/2018   Procedure: ESOPHAGOGASTRODUODENOSCOPY (EGD) WITH PROPOFOL;  Surgeon: Yetta Flock, MD;  Location: [place];  Service: Gastroenterology;  Laterality: N/A;   FEMORAL-POPLITEAL BYPASS GRAFT Right 04/10/2018   Procedure: REPAIR RIGHT FEMORAL ARTERY PSEUDOANEURYSM, RETROPERITONEAL EXPOSURE OF ILIAC ARTERY, RIGHT POPLITEAL EMBOLECTOMY;  Surgeon: Angelia Mould, MD;  Location: [place];  Service: Vascular;  Laterality: Right;   FLEXIBLE SIGMOIDOSCOPY N/A 06/17/2019   Procedure: FLEXIBLE SIGMOIDOSCOPY;  Surgeon: Lavena Bullion, DO;   [facility];  Service: Gastroenterology;  Laterality: N/A;   HEMOSTASIS CLIP PLACEMENT  06/17/2019   Procedure: HEMOSTASIS CLIP PLACEMENT;  Surgeon: Lavena Bullion, DO;  [facility];  Service: Gastroenterology;;   LYMPH NODE DISSECTION Right 06/07/2013   Procedure: LYMPH NODE DISSECTION;  Surgeon: [name], MD;  Location: [place];  Service: Thoracic;  Laterality: Right;   PATCH ANGIOPLASTY Right 04/10/2018   Procedure: PATCH  ANGIOPLASTY OF RIGHT FEMORAL ARTERY USING BOVINE PATCH, PATCH ANGIOPLASTY OF RIGHT POPLITEAL ARTERY USING BOVINE PATCH;  Surgeon: Angelia Mould, MD;  Location: [place];  Service: Vascular;  Laterality: Right;   SCHLEROTHERAPY  06/17/2019   Procedure: [place] Ku OF VARICES;  Surgeon: Lavena Bullion, DO;  [facility];  Service: Gastroenterology;;   [place]   "large incision from chest to up to shoulder, the nerves were tied together, for raynaud's   THORACOTOMY  06/07/2013   Procedure: MINI/LIMITED THORACOTOMY; right middle lobe wedge resection;  Surgeon: [name], MD;  Location: [place];  Service: Thoracic;;   TONSILLECTOMY  child   TOTAL ABDOMINAL HYSTERECTOMY  1980's    W/ BSO   TOTAL HIP ARTHROPLASTY Right 04/28/2014   Procedure: RIGHT TOTAL HIP ARTHROPLASTY ANTERIOR APPROACH;  Surgeon: [name], MD;  [facility];  Service: Orthopedics;  Laterality: Right;   TRANSTHORACIC ECHOCARDIOGRAM  12/11/2008   ef 81-15%, grade 1 diastolic dysfunction/  mild LAE/  mild AR and MR/  trivial TR   VIDEO ASSISTED THORACOSCOPY (VATS)/WEDGE RESECTION Right 06/07/2013   Procedure: VIDEO ASSISTED THORACOSCOPY (VATS)/right upper lobectomy, On Q;  Surgeon: [name], MD;  Location: [place];  Service: Thoracic;  Laterality: Right;   VIDEO BRONCHOSCOPY N/A 06/07/2013   Procedure: VIDEO BRONCHOSCOPY;  Surgeon: [name], MD;  Location: [place];  Service: Thoracic;  Laterality: N/A;    VIDEO BRONCHOSCOPY WITH ENDOBRONCHIAL NAVIGATION N/A 05/04/2013   Procedure: VIDEO BRONCHOSCOPY WITH ENDOBRONCHIAL NAVIGATION;  Surgeon: [name], MD;  Location: [place];  Service: Thoracic;  Laterality: N/A;   Social History   Social History Narrative   Lives with husband, daughter and grandchild local.   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 History  Administered Date(s) Administered   Fluad Quad(high Dose 65+) 11/03/2018, 12/14/2019   Influenza Split 11/21/2011   Influenza Whole 11/29/2007, 11/29/2008, 11/29/2009   Influenza, High Dose Seasonal PF 10/30/2012, 01/02/2015, 11/13/2016, 11/12/2017   Influenza,inj,Quad PF,6+  Mos 11/22/2013, 11/14/2015   Influenza-Unspecified 11/13/2016, 11/12/2017, 12/14/2020   PFIZER(Purple Top)SARS-COV-2 Vaccination 04/12/2019, 05/03/2019, 01/20/2020, 12/14/2020   Pneumococcal Conjugate-13 05/22/2015   Pneumococcal Polysaccharide-23 06/13/2013   Tdap 08/17/2017   Zoster, Live 01/27/2014     Objective: Vital Signs: BP 135/84 (BP Location: Left Arm, Patient Position: Sitting, Cuff Size: Normal)   Pulse 85   Resp 14   Ht 5' (1.524 m)   Wt 108 lb (49 kg)   BMI 21.09 kg/m    Physical Exam Vitals and nursing note reviewed.  Constitutional:      Appearance: She is well-developed.  HENT:     Head: Normocephalic and atraumatic.  Eyes:     Conjunctiva/sclera: Conjunctivae normal.  Cardiovascular:     Rate and Rhythm: Normal rate and regular rhythm.     Heart sounds: Normal heart sounds.  Pulmonary:     Effort: Pulmonary effort is normal.     Breath sounds: Normal breath sounds.  Abdominal:     General: Bowel sounds are normal.     Palpations: Abdomen is soft.  Musculoskeletal:     Cervical back: Decreased range of motion.  Lymphadenopathy:     Cervical: No cervical adenopathy.  Skin:    General: Skin is warm and dry.     Capillary Refill: Capillary refill takes less than 2 seconds.   Neurological:     Mental Status: She is alert and oriented to person, place, and time.  Psychiatric:        Behavior: Behavior normal.     Musculoskeletal Exam: Cervical spine is limited range of motion without rotation. Thoracic kyphosis noted. Very limited ROM in  all directions and crepitus of the shoulders bilaterally. Elbow joints have good range of motion with no tenderness. CMC joint prominence and thickening noted bilaterally. Synovial thickening over MCP joints. PIP and DIP thickening with Bouchard's and Heberden's Nodes consistent with osteoarthritis of both hands. Subluxation of several DIP joints of the right hand noted. Poor ability to form a fist bilaterally.  Hip joints could not be examined in the sitting position.  Knee joints have good range of motion with no warmth or effusion.  Ankle joints have good range of motion with no joint tenderness.  CDAI Exam: CDAI Score: -- Patient Global: 6 mm; Provider Global: 3 mm Swollen: --; Tender: -- Joint Exam 04/01/2022   No joint exam has been documented for this visit   There is currently no information documented on the homunculus. Go to the Rheumatology activity and complete the homunculus joint exam.  Investigation: No additional findings.  Imaging: XR C-ARM NO REPORT  Result Date: 03/28/2022 Please see Notes tab for imaging impression.  MR Lumbar Spine w/o contrast  Result Date: 03/09/2022 CLINICAL DATA:  Question compression fracture. Increased back pain. Pain radiates to the right leg EXAM: MRI LUMBAR SPINE WITHOUT CONTRAST TECHNIQUE: Multiplanar, multisequence MR imaging of the lumbar spine was performed. No intravenous contrast was administered. COMPARISON:  08/04/2014 FINDINGS: Segmentation:  Standard. Alignment: Marked dextroscoliosis. L5-S1 anterolisthesis measuring 1 cm. Vertebrae: No fracture, evidence of discitis, or bone lesion. Chronic L5 pars defects Conus medullaris and cauda equina: Conus extends to the T12  level. Conus and cauda equina appear normal. Paraspinal and other soft tissues: Negative for perispinal mass or inflammation Disc levels: T12- L1: Foraminal disc bulging and mild facet spurring. No impingement L1-L2: Asymmetric left-sided disc height loss and endplate degeneration with mild facet spurring. No impingement L2-L3: Leftward disc collapse with preferential endplate and facet spurring.  Left foraminal impingement. L3-L4: Disc narrowing and bulging with right foraminal protrusion accentuated facet spurring. Right foraminal stenosis. Right subarticular recess stenosis that could affect the L4 nerve root L4-L5: Disc narrowing and bulging eccentric to the right where there is also greater facet spurring. Moderate right foraminal stenosis. L5-S1:Chronic L5 pars defects. Anterolisthesis and disc collapse with disc bulging and ridging. Right more than left foraminal impingement, severe on the right with L5 root flattening. Patent spinal canal IMPRESSION: 1. Generalized lumbar spine degeneration with advanced scoliosis and prominent L5-S1 anterolisthesis. Chronic L5 pars defects. 2. Right foraminal impingement at L3-4 to L5-S1, particularly severe at L5-S1. 3. Left foraminal impingement at L2-3 and L5-S1. 4. Diffusely patent spinal canal. Right subarticular recess stenosis at L3-4. 5. Negative for compression fracture. Electronically Signed   By: Jorje Guild M.D.   On: 03/09/2022 11:14    Recent Labs: Lab Results  Component Value Date   WBC 4.0 03/12/2022   HGB 11.6 (L) 03/12/2022   PLT 205 03/12/2022   NA 130 (L) 03/12/2022   K 4.2 03/12/2022   CL 94 (L) 03/12/2022   CO2 28 03/12/2022   GLUCOSE 81 03/12/2022   BUN 26 (H) 03/12/2022   CREATININE 1.01 (H) 03/12/2022   BILITOT 0.5 03/12/2022   ALKPHOS 36 (L) 03/12/2022   AST 31 03/12/2022   ALT 14 03/12/2022   PROT 6.8 03/12/2022   ALBUMIN 4.0 03/12/2022   CALCIUM 9.0 03/12/2022   GFRAA 60 05/21/2020   QFTBGOLDPLUS Negative 07/10/2021     Speciality Comments: MTX, Arava-side effects Orencia-07/21 Prolia - 04/22/19, 11/09/19, 06/06/20,12/20/20   Fu q 3 months  Procedures:  No procedures performed Allergies: Amlodipine, Prochlorperazine edisylate, Aspirin, Cymbalta [duloxetine hcl], and Pamelor [nortriptyline hcl]   Assessment / Plan:     Visit Diagnoses: Rheumatoid arthritis of multiple sites with negative rheumatoid factor (HCC) - RF negative, anti-CCP negative. She rates her rheumatoid arthritis as a 6/10 today with pain in her fingers, feet and shoulders bilaterally.  Synovial thickening was noted but no synovitis was noted on the examination.  Patient is compliant with Orencia IV infusions every 28 days. She uses extra strength tylenol as needed.   High risk medication use - Orencia 500 mg IV infusions every 28 days. (previously on Arava and Methotrexate-intolerance). She is aware to stop the Orencia if she develops an infection or needs surgery. Standing CBC and CMP for 05/2022. TB gold due in 06/2022.  Labs obtained on March 12, 2022 were reviewed which showed mild anemia and mildly elevated creatinine which has been stable.  She will be getting labs with infusions.  Information on immunization was placed in the AVS.  She was advised to stop Orencia if she develops an infection resume after the infection resolves.  Sicca syndrome ([place]). Patient denies symptoms of xerophthalmia or xerostomia today.  Over-the-counter products were discussed.  Raynaud's disease without gangrene. History of fingers turning white and blue is consistent with Raynaud's. Continue to use gloves and warm water as needed.   Status post total replacement of right hip - 01/2018 revision by Dr. Ninfa Linden. X-rays of the right hip were updated on 09/12/2021 by Dr. Ninfa Linden.  Other idiopathic scoliosis, thoracolumbar region - Thoracic kyphosis noted.  Thoracolumbar scoliosis noted.   Age-related osteoporosis without current pathological fracture - DEXA  01/28/2019: The BMD measured at Femur Neck is 0.636 g/cm2 with a T-score of -2.9. Her last prolia injection was administered on 01/15/2022.  Patient wants to hold off for DEXA scan at  this time.  Height loss - Thoracic kyphosis noted.  History of recurrent UTIs. No urinary symptoms at this time.   Malignant neoplasm of upper lobe of right lung ([place]) - s/p lobectomy 2015.  She is following up closely with Dr. Julien Nordmann.  Elevated cholesterol  Anemia of chronic disease  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[place])  Essential hypertension - She is followed by cardiologist.  Orders: No orders of the defined types were placed in this encounter.  No orders of the defined types were placed in this encounter.   Follow-Up Instructions: Return in about 5 months (around 08/30/2022) for Rheumatoid arthritis, Osteoporosis.   Ofilia Neas, PA-C  Note - This record has been created using Dragon software.  Chart creation errors have been sought, but may not always  have been located. Such creation errors do not reflect on  the standard of medical care.

## 2022-03-27 ENCOUNTER — Ambulatory Visit: Payer: Medicare PPO | Admitting: Physical Medicine and Rehabilitation

## 2022-03-27 ENCOUNTER — Ambulatory Visit: Payer: Self-pay

## 2022-03-27 VITALS — BP 155/89 | HR 94

## 2022-03-27 DIAGNOSIS — M5416 Radiculopathy, lumbar region: Secondary | ICD-10-CM

## 2022-03-27 DIAGNOSIS — M419 Scoliosis, unspecified: Secondary | ICD-10-CM

## 2022-03-27 MED ORDER — METHYLPREDNISOLONE ACETATE 80 MG/ML IJ SUSP
80.0000 mg | Freq: Once | INTRAMUSCULAR | Status: AC
  Administered 2022-03-27: 80 mg

## 2022-03-27 NOTE — Progress Notes (Signed)
Functional Pain Scale - descriptive words and definitions  Moderate (4)   Constantly aware of pain, can complete ADLs with modification/sleep marginally affected at times/passive distraction is of no use, but active distraction gives some relief. Moderate range order  Average Pain  varies depending on medication   +Driver, -BT, -Dye Allergies.  Lower back pain on both side, but on left, radiation going into left leg. Takes Tramadol and Hydrocodone

## 2022-03-27 NOTE — Patient Instructions (Signed)

## 2022-04-01 ENCOUNTER — Ambulatory Visit: Payer: Medicare PPO | Attending: Rheumatology | Admitting: Rheumatology

## 2022-04-01 ENCOUNTER — Encounter: Payer: Self-pay | Admitting: Rheumatology

## 2022-04-01 VITALS — BP 135/84 | HR 85 | Resp 14 | Ht 60.0 in | Wt 108.0 lb

## 2022-04-01 DIAGNOSIS — Z79899 Other long term (current) drug therapy: Secondary | ICD-10-CM

## 2022-04-01 DIAGNOSIS — M4125 Other idiopathic scoliosis, thoracolumbar region: Secondary | ICD-10-CM | POA: Diagnosis not present

## 2022-04-01 DIAGNOSIS — C3411 Malignant neoplasm of upper lobe, right bronchus or lung: Secondary | ICD-10-CM

## 2022-04-01 DIAGNOSIS — D638 Anemia in other chronic diseases classified elsewhere: Secondary | ICD-10-CM

## 2022-04-01 DIAGNOSIS — I7300 Raynaud's syndrome without gangrene: Secondary | ICD-10-CM

## 2022-04-01 DIAGNOSIS — M810 Age-related osteoporosis without current pathological fracture: Secondary | ICD-10-CM

## 2022-04-01 DIAGNOSIS — M0609 Rheumatoid arthritis without rheumatoid factor, multiple sites: Secondary | ICD-10-CM | POA: Diagnosis not present

## 2022-04-01 DIAGNOSIS — R300 Dysuria: Secondary | ICD-10-CM | POA: Diagnosis not present

## 2022-04-01 DIAGNOSIS — E270 Other adrenocortical overactivity: Secondary | ICD-10-CM

## 2022-04-01 DIAGNOSIS — M3500 Sicca syndrome, unspecified: Secondary | ICD-10-CM

## 2022-04-01 DIAGNOSIS — R29890 Loss of height: Secondary | ICD-10-CM

## 2022-04-01 DIAGNOSIS — Z96641 Presence of right artificial hip joint: Secondary | ICD-10-CM

## 2022-04-01 DIAGNOSIS — R350 Frequency of micturition: Secondary | ICD-10-CM

## 2022-04-01 DIAGNOSIS — I10 Essential (primary) hypertension: Secondary | ICD-10-CM

## 2022-04-01 DIAGNOSIS — E7800 Pure hypercholesterolemia, unspecified: Secondary | ICD-10-CM

## 2022-04-01 NOTE — Patient Instructions (Signed)
Standing Labs We placed an order today for your standing lab work.   Please have your standing labs drawn in April and every 3 mmonths  Please have your labs drawn 2 weeks prior to your appointment so that the provider can discuss your lab results at your appointment.  Please note that you may see your imaging and lab results in [place] before we have reviewed them. We will contact you once all results are reviewed. Please allow our office up to 72 hours to thoroughly review all of the results before contacting the office for clarification of your results.  Lab hours are:   Monday through Thursday from 8:00 am -12:30 pm and 1:00 pm-5:00 pm and Friday from 8:00 am-12:00 pm.  Please be advised, all patients with office appointments requiring lab work will take precedent over walk-in lab work.   Labs are drawn by [facility]. Please bring your co-pay at the time of your lab draw.  You may receive a bill from [facility] for your lab work.  Please note if you are on Hydroxychloroquine and and an order has been placed for a Hydroxychloroquine level, you will need to have it drawn 4 hours or more after your last dose.  If you wish to have your labs drawn at another location, please call the office 24 hours in advance so we can fax the orders.  The office is located at [address] [place], [place], [place] [address] No appointment is necessary.    If you have any questions regarding directions or hours of operation,  please call [phone].   As a reminder, please drink plenty of water prior to coming for your lab work. Thanks!   Vaccines You are taking a medication(s) that can suppress your immune system.  The following immunizations are recommended: Flu annually Covid-19  Td/Tdap (tetanus, diphtheria, pertussis) every 10 years Pneumonia (Prevnar 15 then Pneumovax 23 at least 1 year apart.  Alternatively, can take Prevnar 20 without needing additional dose) Shingrix: 2 doses from 4 weeks to  6 months apart  Please check with your PCP to make sure you are up to date.   If you have signs or symptoms of an infection or start antibiotics: First, call your PCP for workup of your infection. Hold your medication through the infection, until you complete your antibiotics, and until symptoms resolve if you take the following: Injectable medication (Actemra, Benlysta, Cimzia, Cosentyx, Enbrel, Humira, Kevzara, Orencia, Remicade, Simponi, Stelara, Taltz, Tremfya) Methotrexate Leflunomide (Arava) Mycophenolate (Cellcept) Morrie Sheldon, Olumiant, or Rinvoq

## 2022-04-09 ENCOUNTER — Encounter (INDEPENDENT_AMBULATORY_CARE_PROVIDER_SITE_OTHER): Payer: Medicare PPO | Admitting: Family Medicine

## 2022-04-09 ENCOUNTER — Ambulatory Visit (HOSPITAL_COMMUNITY)
Admission: RE | Admit: 2022-04-09 | Discharge: 2022-04-09 | Disposition: A | Discharge status: Home / Self Care | Payer: Medicare PPO | Source: Ambulatory Visit | Attending: Rheumatology | Admitting: Rheumatology

## 2022-04-09 DIAGNOSIS — Z79899 Other long term (current) drug therapy: Secondary | ICD-10-CM | POA: Insufficient documentation

## 2022-04-09 DIAGNOSIS — R300 Dysuria: Secondary | ICD-10-CM

## 2022-04-09 DIAGNOSIS — M0609 Rheumatoid arthritis without rheumatoid factor, multiple sites: Secondary | ICD-10-CM | POA: Insufficient documentation

## 2022-04-09 MED ORDER — DIPHENHYDRAMINE HCL 25 MG PO CAPS: 25.0000 mg | ORAL_CAPSULE | ORAL | Status: DC

## 2022-04-09 MED ORDER — ACETAMINOPHEN 325 MG PO TABS: 650.0000 mg | ORAL_TABLET | ORAL | Status: DC

## 2022-04-09 MED ORDER — SODIUM CHLORIDE 0.9 % IV SOLN
500.0000 mg | INTRAVENOUS | Status: DC
  Administered 2022-04-09: 500 mg via INTRAVENOUS
  Filled 2022-04-09: qty 20

## 2022-04-09 NOTE — Progress Notes (Signed)
[name] - 89 y.o. female MRN [id number]  Date of birth: [birth year]  Office Visit Note: Visit Date: 03/27/2022 PCP: Darreld Mclean, MD Referred by: Darreld Mclean, MD  Subjective: Chief Complaint  Patient presents with   Lower Back - Pain   HPI:  [name] is a 89 y.o. female who comes in today at the request of Bevely Palmer Persons, PA-C for planned Left L5-S1 Lumbar Transforaminal epidural steroid injection with fluoroscopic guidance.  The patient has failed conservative care including home exercise, medications, time and activity modification.  This injection will be diagnostic and hopefully therapeutic.  Please see requesting physician notes for further details and justification.   ROS Otherwise per HPI.  Assessment & Plan: Visit Diagnoses:    ICD-10-CM   1. Lumbar radiculopathy  M54.16 XR C-ARM NO REPORT    Epidural Steroid injection    methylPREDNISolone acetate (DEPO-MEDROL) injection 80 mg    2. Scoliosis of thoracolumbar spine, unspecified scoliosis type  M41.9       Plan: No additional findings.   Meds & Orders:  Meds ordered this encounter  Medications   methylPREDNISolone acetate (DEPO-MEDROL) injection 80 mg    Orders Placed This Encounter  Procedures   XR C-ARM NO REPORT   Epidural Steroid injection    Follow-up: Return for visit to requesting provider as needed.   Procedures: No procedures performed  Lumbosacral Transforaminal Epidural Steroid Injection - Sub-Pedicular Approach with Fluoroscopic Guidance  Patient: [name]      Date of Birth: 1930-03-29 MRN: [id number] PCP: Darreld Mclean, MD      Visit Date: 03/27/2022   Universal Protocol:    Date/Time: 03/27/2022  Consent Given By: the patient  Position: PRONE  Additional Comments: Vital signs were monitored before and after the procedure. Patient was prepped and draped in the usual sterile fashion. The correct patient, procedure, and site was  verified.   Injection Procedure Details:   Procedure diagnoses: Lumbar radiculopathy [M54.16]    Meds Administered:  Meds ordered this encounter  Medications   methylPREDNISolone acetate (DEPO-MEDROL) injection 80 mg    Laterality: Left  Location/Site: L5  Needle:5.0 in., 22 ga.  Short bevel or Quincke spinal needle  Needle Placement: Transforaminal  Findings:    -Comments: Excellent flow of contrast along the nerve, nerve root and into the epidural space.  Procedure Details: After squaring off the end-plates to get a true AP view, the C-arm was positioned so that an oblique view of the foramen as noted above was visualized. The target area is just inferior to the "nose of the scotty dog" or sub pedicular. The soft tissues overlying this structure were infiltrated with 2-3 ml. of 1% Lidocaine without Epinephrine.  The spinal needle was inserted toward the target using a "trajectory" view along the fluoroscope beam.  Under AP and lateral visualization, the needle was advanced so it did not puncture dura and was located close the 6 O'Clock position of the pedical in AP tracterory. Biplanar projections were used to confirm position. Aspiration was confirmed to be negative for CSF and/or blood. A 1-2 ml. volume of Isovue-250 was injected and flow of contrast was noted at each level. Radiographs were obtained for documentation purposes.   After attaining the desired flow of contrast documented above, a 0.5 to 1.0 ml test dose of 0.25% Marcaine was injected into each respective transforaminal space.  The patient was observed for 90 seconds post injection.  After no sensory deficits  were reported, and normal lower extremity motor function was noted,   the above injectate was administered so that equal amounts of the injectate were placed at each foramen (level) into the transforaminal epidural space.   Additional Comments:  No complications occurred Dressing: 2 x 2 sterile gauze and  Band-Aid    Post-procedure details: Patient was observed during the procedure. Post-procedure instructions were reviewed.  Patient left the clinic in stable condition.    Clinical History: EXAM: MRI LUMBAR SPINE WITHOUT CONTRAST   TECHNIQUE: Multiplanar, multisequence MR imaging of the lumbar spine was performed. No intravenous contrast was administered.   COMPARISON:  08/04/2014   FINDINGS: Segmentation:  Standard.   Alignment: Marked dextroscoliosis. L5-S1 anterolisthesis measuring 1 cm.   Vertebrae: No fracture, evidence of discitis, or bone lesion. Chronic L5 pars defects   Conus medullaris and cauda equina: Conus extends to the T12 level. Conus and cauda equina appear normal.   Paraspinal and other soft tissues: Negative for perispinal mass or inflammation   Disc levels:   T12- L1: Foraminal disc bulging and mild facet spurring. No impingement   L1-L2: Asymmetric left-sided disc height loss and endplate degeneration with mild facet spurring. No impingement   L2-L3: Leftward disc collapse with preferential endplate and facet spurring. Left foraminal impingement.   L3-L4: Disc narrowing and bulging with right foraminal protrusion accentuated facet spurring. Right foraminal stenosis. Right subarticular recess stenosis that could affect the L4 nerve root   L4-L5: Disc narrowing and bulging eccentric to the right where there is also greater facet spurring. Moderate right foraminal stenosis.   L5-S1:Chronic L5 pars defects. Anterolisthesis and disc collapse with disc bulging and ridging. Right more than left foraminal impingement, severe on the right with L5 root flattening. Patent spinal canal   IMPRESSION: 1. Generalized lumbar spine degeneration with advanced scoliosis and prominent L5-S1 anterolisthesis. Chronic L5 pars defects. 2. Right foraminal impingement at L3-4 to L5-S1, particularly severe at L5-S1. 3. Left foraminal impingement at L2-3 and  L5-S1. 4. Diffusely patent spinal canal. Right subarticular recess stenosis at L3-4. 5. Negative for compression fracture.     Electronically Signed   By: Jorje Guild M.D.   On: 03/09/2022 11:14     Objective:  VS:  HT:    WT:   BMI:     BP:(!) 155/89  HR:94bpm  TEMP: ( )  RESP:  Physical Exam Vitals and nursing note reviewed.  Constitutional:      General: She is not in acute distress.    Appearance: Normal appearance. She is not ill-appearing.  HENT:     Head: Normocephalic and atraumatic.     Right Ear: External ear normal.     Left Ear: External ear normal.  Eyes:     Extraocular Movements: Extraocular movements intact.  Cardiovascular:     Rate and Rhythm: Normal rate.     Pulses: Normal pulses.  Pulmonary:     Effort: Pulmonary effort is normal. No respiratory distress.  Abdominal:     General: There is no distension.     Palpations: Abdomen is soft.  Musculoskeletal:        General: Tenderness present.     Cervical back: Neck supple.     Right lower leg: No edema.     Left lower leg: No edema.     Comments: Patient has good distal strength with no pain over the greater trochanters.  No clonus or focal weakness.  Skin:    Findings: No erythema, lesion or  rash.  Neurological:     General: No focal deficit present.     Mental Status: She is alert and oriented to person, place, and time.     Sensory: No sensory deficit.     Motor: No weakness or abnormal muscle tone.     Coordination: Coordination normal.  Psychiatric:        Mood and Affect: Mood normal.        Behavior: Behavior normal.      Imaging: No results found.

## 2022-04-09 NOTE — Procedures (Signed)
Lumbosacral Transforaminal Epidural Steroid Injection - Sub-Pedicular Approach with Fluoroscopic Guidance  Patient: [name]      Date of Birth: [birth year] MRN: [id number] PCP: Darreld Mclean, MD      Visit Date: 03/27/2022   Universal Protocol:    Date/Time: 03/27/2022  Consent Given By: the patient  Position: PRONE  Additional Comments: Vital signs were monitored before and after the procedure. Patient was prepped and draped in the usual sterile fashion. The correct patient, procedure, and site was verified.   Injection Procedure Details:   Procedure diagnoses: Lumbar radiculopathy [M54.16]    Meds Administered:  Meds ordered this encounter  Medications   methylPREDNISolone acetate (DEPO-MEDROL) injection 80 mg    Laterality: Left  Location/Site: L5  Needle:5.0 in., 22 ga.  Short bevel or Quincke spinal needle  Needle Placement: Transforaminal  Findings:    -Comments: Excellent flow of contrast along the nerve, nerve root and into the epidural space.  Procedure Details: After squaring off the end-plates to get a true AP view, the C-arm was positioned so that an oblique view of the foramen as noted above was visualized. The target area is just inferior to the "nose of the scotty dog" or sub pedicular. The soft tissues overlying this structure were infiltrated with 2-3 ml. of 1% Lidocaine without Epinephrine.  The spinal needle was inserted toward the target using a "trajectory" view along the fluoroscope beam.  Under AP and lateral visualization, the needle was advanced so it did not puncture dura and was located close the 6 O'Clock position of the pedical in AP tracterory. Biplanar projections were used to confirm position. Aspiration was confirmed to be negative for CSF and/or blood. A 1-2 ml. volume of Isovue-250 was injected and flow of contrast was noted at each level. Radiographs were obtained for documentation purposes.   After attaining the desired  flow of contrast documented above, a 0.5 to 1.0 ml test dose of 0.25% Marcaine was injected into each respective transforaminal space.  The patient was observed for 90 seconds post injection.  After no sensory deficits were reported, and normal lower extremity motor function was noted,   the above injectate was administered so that equal amounts of the injectate were placed at each foramen (level) into the transforaminal epidural space.   Additional Comments:  No complications occurred Dressing: 2 x 2 sterile gauze and Band-Aid    Post-procedure details: Patient was observed during the procedure. Post-procedure instructions were reviewed.  Patient left the clinic in stable condition.

## 2022-04-10 ENCOUNTER — Other Ambulatory Visit: Payer: Medicare PPO

## 2022-04-10 DIAGNOSIS — R300 Dysuria: Secondary | ICD-10-CM

## 2022-04-10 MED ORDER — CEPHALEXIN 500 MG PO CAPS: 500.0000 mg | ORAL_CAPSULE | Freq: Two times a day (BID) | ORAL | 0 refills | Status: AC

## 2022-04-10 NOTE — Addendum Note (Signed)
Addended by: Lamar Blinks C on: 04/10/2022 10:06 AM   Modules accepted: Orders

## 2022-04-10 NOTE — Telephone Encounter (Signed)
Please see the MyChart message reply(ies) for my assessment and plan.  The patient gave consent for this Medical Advice Message and is aware that it may result in a bill to their insurance company as well as the possibility that this may result in a co-payment or deductible. They are an established patient, but are not seeking medical advice exclusively about a problem treated during an in person or video visit in the last 7 days. I did not recommend an in person or video visit within 7 days of my reply.  I spent a total of 12 minutes cumulative time within 7 days through [facility] messaging Lamar Blinks, MD

## 2022-04-12 LAB — URINE CULTURE
MICRO NUMBER:: 14600990
SPECIMEN QUALITY:: ADEQUATE

## 2022-04-12 MED ORDER — SULFAMETHOXAZOLE-TRIMETHOPRIM 800-160 MG PO TABS: 1.0000 | ORAL_TABLET | Freq: Two times a day (BID) | ORAL | 0 refills | Status: DC

## 2022-04-12 NOTE — Telephone Encounter (Signed)
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   Comment: 10,000-49,000 CFU/mL of Citrobacter braakii  Resulting Agency [facility] [place]     Susceptibility   Citrobacter braakii    URINE CULTURE, REFLEX    AMOX/CLAVULANIC 16 Resistant    CEFAZOLIN >=64 Resistant 1    CEFEPIME <=1 Sensitive    CEFTAZIDIME <=1 Sensitive    CEFTRIAXONE <=1 Sensitive    CIPROFLOXACIN <=0.25 Sensitive    GENTAMICIN <=1 Sensitive    IMIPENEM 0.5 Sensitive    LEVOFLOXACIN <=0.12 Sensitive    NITROFURANTOIN <=16 Sensitive    PIP/TAZO <=4 Sensitive    TOBRAMYCIN <=1 Sensitive    TRIMETH/SULFA <=20 Sensitive 2                  She is currently taking Keflex which will not be effective Called in septra instead for 5 days Called and let patient know, she states understanding

## 2022-04-12 NOTE — Addendum Note (Signed)
Addended by: Darreld Mclean on: 04/12/2022 04:27 PM   Modules accepted: Orders

## 2022-04-15 ENCOUNTER — Telehealth: Payer: Self-pay | Admitting: Internal Medicine

## 2022-04-15 NOTE — Telephone Encounter (Signed)
Called patient regarding upcoming March appointments, left a voicemail.

## 2022-04-22 ENCOUNTER — Other Ambulatory Visit: Payer: Self-pay | Admitting: Internal Medicine

## 2022-04-22 ENCOUNTER — Ambulatory Visit (HOSPITAL_COMMUNITY)
Admission: RE | Admit: 2022-04-22 | Discharge: 2022-04-22 | Disposition: A | Discharge status: Home / Self Care | Payer: Medicare PPO | Source: Ambulatory Visit | Attending: Internal Medicine | Admitting: Internal Medicine

## 2022-04-22 ENCOUNTER — Inpatient Hospital Stay: Payer: Medicare PPO | Attending: Nurse Practitioner

## 2022-04-22 DIAGNOSIS — Z85118 Personal history of other malignant neoplasm of bronchus and lung: Secondary | ICD-10-CM | POA: Insufficient documentation

## 2022-04-22 DIAGNOSIS — C3490 Malignant neoplasm of unspecified part of unspecified bronchus or lung: Secondary | ICD-10-CM

## 2022-04-22 DIAGNOSIS — Z08 Encounter for follow-up examination after completed treatment for malignant neoplasm: Secondary | ICD-10-CM | POA: Insufficient documentation

## 2022-04-22 DIAGNOSIS — R918 Other nonspecific abnormal finding of lung field: Secondary | ICD-10-CM | POA: Insufficient documentation

## 2022-04-22 DIAGNOSIS — J90 Pleural effusion, not elsewhere classified: Secondary | ICD-10-CM | POA: Diagnosis not present

## 2022-04-22 LAB — CBC WITH DIFFERENTIAL (CANCER CENTER ONLY)
Abs Immature Granulocytes: 0.05 10*3/uL (ref 0.00–0.07)
Basophils Absolute: 0 10*3/uL (ref 0.0–0.1)
Basophils Relative: 0 %
Eosinophils Absolute: 0.1 10*3/uL (ref 0.0–0.5)
Eosinophils Relative: 1 %
HCT: 35 % — ABNORMAL LOW (ref 36.0–46.0)
Hemoglobin: 11.4 g/dL — ABNORMAL LOW (ref 12.0–15.0)
Immature Granulocytes: 0 %
Lymphocytes Relative: 6 %
Lymphs Abs: 0.7 10*3/uL (ref 0.7–4.0)
MCH: 29.1 pg (ref 26.0–34.0)
MCHC: 32.6 g/dL (ref 30.0–36.0)
MCV: 89.3 fL (ref 80.0–100.0)
Monocytes Absolute: 1 10*3/uL (ref 0.1–1.0)
Monocytes Relative: 9 %
Neutro Abs: 9.9 10*3/uL — ABNORMAL HIGH (ref 1.7–7.7)
Neutrophils Relative %: 84 %
Platelet Count: 205 10*3/uL (ref 150–400)
RBC: 3.92 MIL/uL (ref 3.87–5.11)
RDW: 14.3 % (ref 11.5–15.5)
WBC Count: 11.8 10*3/uL — ABNORMAL HIGH (ref 4.0–10.5)
nRBC: 0 % (ref 0.0–0.2)

## 2022-04-22 LAB — CMP (CANCER CENTER ONLY)
ALT: 17 U/L (ref 0–44)
AST: 25 U/L (ref 15–41)
Albumin: 4.3 g/dL (ref 3.5–5.0)
Alkaline Phosphatase: 51 U/L (ref 38–126)
Anion gap: 6 (ref 5–15)
BUN: 34 mg/dL — ABNORMAL HIGH (ref 8–23)
CO2: 27 mmol/L (ref 22–32)
Calcium: 8.9 mg/dL (ref 8.9–10.3)
Chloride: 96 mmol/L — ABNORMAL LOW (ref 98–111)
Creatinine: 1.7 mg/dL — ABNORMAL HIGH (ref 0.44–1.00)
GFR, Estimated: 28 mL/min — ABNORMAL LOW (ref >60)
Glucose, Bld: 137 mg/dL — ABNORMAL HIGH (ref 70–99)
Potassium: 4.9 mmol/L (ref 3.5–5.1)
Sodium: 129 mmol/L — ABNORMAL LOW (ref 135–145)
Total Bilirubin: 0.2 mg/dL — ABNORMAL LOW (ref 0.3–1.2)
Total Protein: 7.4 g/dL (ref 6.5–8.1)

## 2022-04-24 ENCOUNTER — Inpatient Hospital Stay (HOSPITAL_BASED_OUTPATIENT_CLINIC_OR_DEPARTMENT_OTHER): Payer: Medicare PPO | Admitting: Internal Medicine

## 2022-04-24 ENCOUNTER — Encounter: Payer: Self-pay | Admitting: Radiology

## 2022-04-24 VITALS — BP 148/73 | HR 89 | Temp 98.3°F | Resp 17 | Wt 108.3 lb

## 2022-04-24 DIAGNOSIS — C3490 Malignant neoplasm of unspecified part of unspecified bronchus or lung: Secondary | ICD-10-CM | POA: Diagnosis not present

## 2022-04-24 DIAGNOSIS — Z08 Encounter for follow-up examination after completed treatment for malignant neoplasm: Secondary | ICD-10-CM | POA: Diagnosis not present

## 2022-04-24 DIAGNOSIS — R918 Other nonspecific abnormal finding of lung field: Secondary | ICD-10-CM | POA: Diagnosis not present

## 2022-04-24 DIAGNOSIS — Z85118 Personal history of other malignant neoplasm of bronchus and lung: Secondary | ICD-10-CM | POA: Diagnosis not present

## 2022-04-24 NOTE — Progress Notes (Signed)
[facility] [facility] [phone]   [phone]  OFFICE PROGRESS NOTE  Copland, Gay Filler, MD [address] [place] [address]  DIAGNOSIS: Multifocal non-small cell lung cancer, adenocarcinoma initially diagnosed as stage IIA (T2b, N0, M0) non-small cell lung cancer, adenocarcinoma with positive EGFR mutation in exon 21 (L858R) presented with right upper lobe lung mass diagnosed in March of 2015.  PRIOR THERAPY:  1) Status post right upper lobectomy with wedge resection of the right middle lobe under the care of Dr. Servando Snare on 06/07/2013 2) Tagrisso 80 mg p.o. daily started November 19, 2021.  Status post 4 weeks of treatment.  This was discontinued secondary to intolerance and questionable drug-induced pneumonitis.  CURRENT THERAPY: Observation.  INTERVAL HISTORY: [name] 89 y.o. female returns to the clinic today for follow-up visit accompanied by her daughter.  The patient is feeling fine today with no concerning complaints except for the baseline fatigue and shortness of breath with exertion.  She was treated recently for urinary tract infection and her serum creatinine was elevated.  She denied having any current chest pain, cough or hemoptysis.  She has no nausea, vomiting, diarrhea or constipation.  She has no headache or visual changes.  She is here today for evaluation and repeat CT scan of the chest for restaging of her disease.  MEDICAL HISTORY: Past Medical History:  Diagnosis Date   Arthralgia of multiple joints    followed by dr Gerilyn Nestle   Arthritis    Cardiomyopathy ([place])    Chronic constipation    Chronic inflammatory arthritis    rhemotolgist-  dr a. Gerilyn Nestle ([facility])   Dry eyes    eye drops used    GERD (gastroesophageal reflux disease)    H/O discoid lupus erythematosus    Hiatal hernia    History of colon polyps    Hypothyroidism    Iron deficiency anemia    LBBB (left bundle branch block)  2010   Mitchell's disease (erythromelalgia) [facility])    neurologist-  dr patel   Nocturia    Non-small cell cancer of right lung [facility]) surgeon-- dr gerhardt/  oncologist-  dr Julien Nordmann--- per lov notes no recurrence/   11-18-2017 per pt denies any symptoms   dx 2015--  Stage IIA (T2b,N0,M0) , +EGFR  mutation in exon 21, non-small cell adenocarcinoma right upper lobe---  s/p  Right upper lobectomy , right middley wedge resection and node dissection---  no chemo or radiation therapy   OA (osteoarthritis)    hands   Osteoporosis    PONV (postoperative nausea and vomiting)    likes phenergan   Raynaud's phenomenon 1965   Renal insufficiency    Rheumatoid arthritis ([place])    Sciatica    Scoliosis    Sjogren's syndrome ([place])     ALLERGIES:  is allergic to amlodipine, prochlorperazine edisylate, aspirin, cymbalta [duloxetine hcl], and pamelor [nortriptyline hcl].  MEDICATIONS:  Current Outpatient Medications  Medication Sig Dispense Refill   Abatacept (ORENCIA IV) Inject into the vein every 28 (twenty-eight) days.     Acetaminophen (TYLENOL EXTRA STRENGTH PO) Take 1-2 tablets by mouth every 6 (six) hours as needed (pain).      Biotin 1000 MCG tablet Take 1,000 mcg by mouth daily.     Calcium Carbonate-Vitamin D 500-125 MG-UNIT TABS Take 1 tablet by mouth daily.     carvedilol (COREG) 3.125 MG tablet Take 1 tablet (3.125 mg total) by mouth 2 (  two) times daily with a meal. 180 tablet 3   denosumab (PROLIA) 60 MG/ML SOSY injection Inject 60 mg into the skin every 6 (six) months.     estradiol (ESTRACE VAGINAL) 0.1 MG/GM vaginal cream Place one gram vaginally up to three times a week as needed to maintain comfort 42.5 g 12   furosemide (LASIX) 40 MG tablet Take 1 tablet (40 mg total) by mouth daily. 30 tablet 3   gabapentin (NEURONTIN) 300 MG capsule TAKE 1 CAPSULE BY MOUTH IN THE MORNING, 1 IN THE AFTERNOON, AND 2 AT BEDTIME 360 capsule 1   Glucosamine-Chondroit-Vit C-Mn (GLUCOSAMINE CHONDR 1500  COMPLX PO) Take 1 capsule by mouth daily.     HYDROcodone-acetaminophen (NORCO/VICODIN) 5-325 MG tablet Take 1 tablet by mouth 2 (two) times daily as needed for moderate pain. 30 tablet 0   levothyroxine (SYNTHROID) 75 MCG tablet Take 1 tablet (75 mcg total) by mouth daily before breakfast. 90 tablet 1   methocarbamol (ROBAXIN) 500 MG tablet Take 1 tablet (500 mg total) by mouth every 6 (six) hours as needed for muscle spasms. 60 tablet 1   Multiple Vitamin (MULTIVITAMIN) tablet Take 1 tablet by mouth daily.     omeprazole (PRILOSEC) 20 MG capsule TAKE 1 CAPSULE BY MOUTH EVERY DAY (Patient taking differently: Take 20 mg by mouth daily.) 90 capsule 3   Probiotic Product (PROBIOTIC PO) Take 1 capsule by mouth daily.      sulfamethoxazole-trimethoprim (BACTRIM DS) 800-160 MG tablet Take 1 tablet by mouth 2 (two) times daily. 10 tablet 0   traMADol (ULTRAM) 50 MG tablet TAKE 1/2-1 TABLET BY MOUTH EVERY 8 HOURS AS NEEDED. DO NOT COMBINE WITH OTHER PAIN MEDICATION 60 tablet 1   VITAMIN D PO Take by mouth daily.     No current facility-administered medications for this visit.    SURGICAL HISTORY:  Past Surgical History:  Procedure Laterality Date   ANTERIOR HIP REVISION Right 11/27/2017   Procedure: RIGHT HIP ACETABULAR REVISION;  Surgeon: [name], MD;  Location: [facility];  Service: Orthopedics;  Laterality: Right;   ANTERIOR HIP REVISION Right 01/24/2018   Procedure: OPEN REDUCTION OF DISLOCATED ANTERIOR HIP WITH REVISION OF LINER AND HIP BALL;  Surgeon: [name], MD;  Location: [facility];  Service: Orthopedics;  Laterality: Right;   APPENDECTOMY  1950s   BIOPSY  04/14/2018   Procedure: BIOPSY;  Surgeon: Yetta Flock, MD;  Location: [place];  Service: Gastroenterology;;   BIOPSY  04/16/2018   Procedure: BIOPSY;  Surgeon: Irving Copas., MD;  Location: [place];  Service: Gastroenterology;;   CARDIOVASCULAR STRESS TEST  12/2008    mild fixed basal  to mid septal perfusion defect felt likely due to artifact from LBBB, no ischemia, EF 58%   COLONOSCOPY     COLONOSCOPY WITH PROPOFOL N/A 04/16/2018   Procedure: COLONOSCOPY WITH PROPOFOL;  Surgeon: Irving Copas., MD;  Location: [place];  Service: Gastroenterology;  Laterality: N/A;   ESOPHAGOGASTRODUODENOSCOPY (EGD) WITH PROPOFOL N/A 04/14/2018   Procedure: ESOPHAGOGASTRODUODENOSCOPY (EGD) WITH PROPOFOL;  Surgeon: Yetta Flock, MD;  Location: [place];  Service: Gastroenterology;  Laterality: N/A;   FEMORAL-POPLITEAL BYPASS GRAFT Right 04/10/2018   Procedure: REPAIR RIGHT FEMORAL ARTERY PSEUDOANEURYSM, RETROPERITONEAL EXPOSURE OF ILIAC ARTERY, RIGHT POPLITEAL EMBOLECTOMY;  Surgeon: Angelia Mould, MD;  Location: [facility];  Service: Vascular;  Laterality: Right;   FLEXIBLE SIGMOIDOSCOPY N/A 06/17/2019   Procedure: FLEXIBLE SIGMOIDOSCOPY;  Surgeon: Lavena Bullion, DO;  [facility];  Service: Gastroenterology;  Laterality: N/A;   HEMOSTASIS CLIP PLACEMENT  06/17/2019   Procedure: HEMOSTASIS CLIP PLACEMENT;  Surgeon: Lavena Bullion, DO;  [facility];  Service: Gastroenterology;;   LYMPH NODE DISSECTION Right 06/07/2013   Procedure: LYMPH NODE DISSECTION;  Surgeon: [name], MD;  Location: [facility];  Service: Thoracic;  Laterality: Right;   PATCH ANGIOPLASTY Right 04/10/2018   Procedure: PATCH  ANGIOPLASTY OF RIGHT FEMORAL ARTERY USING BOVINE PATCH, PATCH ANGIOPLASTY OF RIGHT POPLITEAL ARTERY USING BOVINE PATCH;  Surgeon: Angelia Mould, MD;  Location: [place];  Service: Vascular;  Laterality: Right;   SCHLEROTHERAPY  06/17/2019   Procedure: [place] Ku OF VARICES;  Surgeon: Lavena Bullion, DO;  Location[facility];  Service: Gastroenterology;;   [place]   "large incision from chest to up to shoulder, the nerves were tied together, for raynaud's   THORACOTOMY  06/07/2013   Procedure: MINI/LIMITED  THORACOTOMY; right middle lobe wedge resection;  Surgeon: [name], MD;  Location: [place];  Service: Thoracic;;   TONSILLECTOMY  child   TOTAL ABDOMINAL HYSTERECTOMY  1980's    W/ BSO   TOTAL HIP ARTHROPLASTY Right 04/28/2014   Procedure: RIGHT TOTAL HIP ARTHROPLASTY ANTERIOR APPROACH;  Surgeon: [name], MD;  [facility]: [facility];  Service: Orthopedics;  Laterality: Right;   TRANSTHORACIC ECHOCARDIOGRAM  12/11/2008   ef Q000111Q, grade 1 diastolic dysfunction/  mild LAE/  mild AR and MR/  trivial TR   VIDEO ASSISTED THORACOSCOPY (VATS)/WEDGE RESECTION Right 06/07/2013   Procedure: VIDEO ASSISTED THORACOSCOPY (VATS)/right upper lobectomy, On Q;  Surgeon: [name], MD;  Location: [facility];  Service: Thoracic;  Laterality: Right;   VIDEO BRONCHOSCOPY N/A 06/07/2013   Procedure: VIDEO BRONCHOSCOPY;  Surgeon: [name], MD;  Location: [facility];  Service: Thoracic;  Laterality: N/A;   VIDEO BRONCHOSCOPY WITH ENDOBRONCHIAL NAVIGATION N/A 05/04/2013   Procedure: VIDEO BRONCHOSCOPY WITH ENDOBRONCHIAL NAVIGATION;  Surgeon: [name], MD;  Location: [facility];  Service: Thoracic;  Laterality: N/A;    REVIEW OF SYSTEMS:  A comprehensive review of systems was negative except for: Constitutional: positive for fatigue Respiratory: positive for dyspnea on exertion   PHYSICAL EXAMINATION: General appearance: alert, cooperative, fatigued, and no distress Head: Normocephalic, without obvious abnormality, atraumatic Neck: no adenopathy, no JVD, supple, symmetrical, trachea midline, and thyroid not enlarged, symmetric, no tenderness/mass/nodules Lymph nodes: Cervical, supraclavicular, and axillary nodes normal. Resp: clear to auscultation bilaterally Back: symmetric, no curvature. ROM normal. No CVA tenderness. Cardio: regular rate and rhythm, S1, S2 normal, no murmur, click, rub or gallop GI: soft, non-tender; bowel sounds normal; no masses,  no organomegaly Extremities:  extremities normal, atraumatic, no cyanosis or edema  ECOG PERFORMANCE STATUS: 1 - Symptomatic but completely ambulatory  Blood pressure (!) 148/73, pulse 89, temperature 98.3 F (36.8 C), temperature source Oral, resp. rate 17, weight 108 lb 5 oz (49.1 kg), SpO2 99 %.  LABORATORY DATA: Lab Results  Component Value Date   WBC 11.8 (H) 04/22/2022   HGB 11.4 (L) 04/22/2022   HCT 35.0 (L) 04/22/2022   MCV 89.3 04/22/2022   PLT 205 04/22/2022      Chemistry      Component Value Date/Time   NA 129 (L) 04/22/2022 1259   NA 130 (L) 04/26/2021 0000   NA 130 (L) 08/21/2016 1125   K 4.9 04/22/2022 1259   K 4.8 08/21/2016 1125   CL 96 (L) 04/22/2022 1259   CO2 27 04/22/2022  1259   CO2 26 08/21/2016 1125   BUN 34 (H) 04/22/2022 1259   BUN 27 04/26/2021 0000   BUN 25.6 08/21/2016 1125   CREATININE 1.70 (H) 04/22/2022 1259   CREATININE 0.98 (H) 05/21/2020 1341   CREATININE 1.2 (H) 08/21/2016 1125      Component Value Date/Time   CALCIUM 8.9 04/22/2022 1259   CALCIUM 10.0 08/21/2016 1125   ALKPHOS 51 04/22/2022 1259   ALKPHOS 54 08/21/2016 1125   AST 25 04/22/2022 1259   AST 27 08/21/2016 1125   ALT 17 04/22/2022 1259   ALT 13 08/21/2016 1125   BILITOT 0.2 (L) 04/22/2022 1259   BILITOT 0.31 08/21/2016 1125       RADIOGRAPHIC STUDIES: CT CHEST WO CONTRAST  Result Date: 04/23/2022 CLINICAL DATA:  Non-small cell lung cancer restaging * Tracking Code: BO * EXAM: CT CHEST WITHOUT CONTRAST TECHNIQUE: Multidetector CT imaging of the chest was performed following the standard protocol without IV contrast. RADIATION DOSE REDUCTION: This exam was performed according to the departmental dose-optimization program which includes automated exposure control, adjustment of the mA and/or kV according to patient size and/or use of iterative reconstruction technique. COMPARISON:  01/22/2022 FINDINGS: Cardiovascular: Atherosclerotic calcification of the thoracic aorta and left main coronary artery.  Mild cardiomegaly. Mediastinum/Nodes: Moderate hiatal hernia. Lungs/Pleura: Biapical pleuroparenchymal scarring. Numerous scattered solid and subsolid nodules in both lungs appear generally enlarged from prior. Index solid lingular nodule 7 by 6 mm on image 88 series 5, previously 5 by 5 mm. Index triangular mostly subsolid nodule in the right lower lobe measuring 1.1 by 1.1 cm on image 71 series 5, formerly 1.0 by 1.0 cm. Index subsolid nodule in the right lower lobe measures 1.6 by 0.8 cm on image 51 series 5, formerly 1.4 by 0.6 cm. No definite well-defined new nodules. Right upper lobectomy. Previous right pleural effusion has resolved. Upper Abdomen: Abdominal aortic atherosclerosis. Musculoskeletal: Substantial bilateral degenerative glenohumeral arthropathy. Old healed left third rib fracture. Substantial dextroconvex scoliosis in the lumbar spine. IMPRESSION: 1. Mild enlargement of the numerous scattered solid and subsolid nodules in both lungs, suspicious for low-grade multifocal adenocarcinoma. 2. Previous right pleural effusion has resolved. 3. Moderate hiatal hernia. 4. Substantial dextroconvex scoliosis in the lumbar spine. 5. Substantial bilateral degenerative glenohumeral arthropathy. 6. Mild cardiomegaly. 7. Old healed left third rib fracture. 8. Aortic atherosclerosis. Aortic Atherosclerosis (ICD10-I70.0). Electronically Signed   By: Van Clines M.D.   On: 04/23/2022 16:05   XR C-ARM NO REPORT  Result Date: 03/28/2022 Please see Notes tab for imaging impression.  Epidural Steroid injection  Result Date: 03/27/2022 Magnus Sinning, MD     04/09/2022  5:42 PM Lumbosacral Transforaminal Epidural Steroid Injection - Sub-Pedicular Approach with Fluoroscopic Guidance Patient: [name]     Date of Birth: 05-28-1930 MRN: [id number] PCP: Darreld Mclean, MD     Visit Date: 03/27/2022  Universal Protocol:   Date/Time: 03/27/2022 Consent Given By: the patient Position: PRONE Additional  Comments: Vital signs were monitored before and after the procedure. Patient was prepped and draped in the usual sterile fashion. The correct patient, procedure, and site was verified. Injection Procedure Details: Procedure diagnoses: Lumbar radiculopathy [M54.16]  Meds Administered: Meds ordered this encounter Medications  methylPREDNISolone acetate (DEPO-MEDROL) injection 80 mg Laterality: Left Location/Site: L5 Needle:5.0 in., 22 ga.  Short bevel or Quincke spinal needle Needle Placement: Transforaminal Findings:   -Comments: Excellent flow of contrast along the nerve, nerve root and into the epidural space. Procedure Details: After squaring  off the end-plates to get a true AP view, the C-arm was positioned so that an oblique view of the foramen as noted above was visualized. The target area is just inferior to the "nose of the scotty dog" or sub pedicular. The soft tissues overlying this structure were infiltrated with 2-3 ml. of 1% Lidocaine without Epinephrine. The spinal needle was inserted toward the target using a "trajectory" view along the fluoroscope beam.  Under AP and lateral visualization, the needle was advanced so it did not puncture dura and was located close the 6 O'Clock position of the pedical in AP tracterory. Biplanar projections were used to confirm position. Aspiration was confirmed to be negative for CSF and/or blood. A 1-2 ml. volume of Isovue-250 was injected and flow of contrast was noted at each level. Radiographs were obtained for documentation purposes. After attaining the desired flow of contrast documented above, a 0.5 to 1.0 ml test dose of 0.25% Marcaine was injected into each respective transforaminal space.  The patient was observed for 90 seconds post injection.  After no sensory deficits were reported, and normal lower extremity motor function was noted,   the above injectate was administered so that equal amounts of the injectate were placed at each foramen (level) into the  transforaminal epidural space. Additional Comments: No complications occurred Dressing: 2 x 2 sterile gauze and Band-Aid  Post-procedure details: Patient was observed during the procedure. Post-procedure instructions were reviewed. Patient left the clinic in stable condition.    ASSESSMENT AND PLAN:  This is a very pleasant 89 years old white female with multifocal bilateral adenocarcinoma initially diagnosed as stage IIA non-small cell lung cancer, adenocarcinoma with positive EGFR mutation in exon 21 status post right upper lobectomy as well as wedge resection of the right middle lobe in March 2015 and the patient declined adjuvant systemic chemotherapy. The patient had recent imaging studies that showed increase in the size and number of multifocal disease. She is currently undergoing treatment with Tagrisso 80 mg p.o. daily started on November 19, 2021, status post 1 month of treatment. The patient was tolerating her treatment well except for the hospitalization in early November 2023 with suspicious drug-induced pneumonitis and significant shortness of breath.  There was also concern about possibility of pulmonary edema.  The patient was treated with high-dose steroids and he felt much better. The patient is currently on observation and she is feeling fine with no concerning complaints except for the mild fatigue and shortness of breath with exertion. She had repeat CT scan of the chest performed recently.  I personally and independently reviewed the scan images and discussed the result with the patient and her daughter.  Her scan showed mild enlargement of the numerous scattered solid and subsolid nodules in both lungs suspicious for low-grade multifocal adenocarcinoma. The previous right pleural effusion had resolved. Give the patient the option of resuming treatment with targeted therapy with a different agent like Erlotinib at a reduced dose of 100 mg p.o. daily versus continuous observation and  monitoring.  The patient is still concerned about the adverse effect of treatment and she would like to continue on observation for now. I will see her back for follow-up visit in 3 months for evaluation with repeat CT scan of the chest with contrast if her renal function is better. The patient was advised to call immediately if she has any other concerning symptoms in the interval. The patient voices understanding of current disease status and treatment options and is in  agreement with the current care plan. All questions were answered. The patient knows to call the clinic with any problems, questions or concerns. We can certainly see the patient much sooner if necessary. The total time spent in the appointment was 20 minutes.  Disclaimer: This note was dictated with voice recognition software. Similar sounding words can inadvertently be transcribed and may not be corrected upon review.

## 2022-04-26 ENCOUNTER — Other Ambulatory Visit: Payer: Self-pay | Admitting: Family Medicine

## 2022-04-26 ENCOUNTER — Other Ambulatory Visit: Payer: Self-pay | Admitting: Orthopaedic Surgery

## 2022-04-27 NOTE — Telephone Encounter (Signed)
Called pt back- she feels like she is ok, no pain and her temp is down. She does not wish to go to the ER I made her an appt with me tomorrow at 1pm in case she needs to be seen- can cancel if not needed

## 2022-04-28 ENCOUNTER — Ambulatory Visit: Payer: Medicare PPO | Admitting: Family Medicine

## 2022-04-30 NOTE — Progress Notes (Signed)
HPI: FU CHF. Nuclear study 2010 showed ejection fraction 58% with fixed septal defect related to left bundle branch block and no ischemia.  Echocardiogram February 2020 showed normal LV systolic function grade 1 diastolic dysfunction, mild left atrial enlargement, mild aortic insufficiency.  Monitor April 2021 showed occasional PVC and brief episode of SVT.  Patient has been diagnosed with lung cancer.  CT February 2023 showed widespread pulmonary nodules increasing in size and emphysema.  Admitted November 2023 with acute respiratory failure felt secondary to CHF.  She was diuresed with improvement and also had thoracentesis.  Echocardiogram November 2023 showed ejection fraction 40 to [id number], grade 2 diastolic dysfunction, moderate left atrial enlargement, mild right atrial enlargement, left pleural effusion, severe mitral regurgitation, trace aortic insufficiency; mitral regurgitation is worse compared to previous.  There was discussion with Dr. Burt Knack concerning potential MitraClip.  There was also note that Tagrisso could be contributing to CHF.  She elected to discontinue Tagrisso to see if her symptoms would improve.  CT March 2024 showed enlargement of numerous scattered solid and subsolid nodules in both lungs suspicious for low-grade adenocarcinoma. Venous Dopplers December 2023 showed no DVT.  Since last seen, she has some dyspnea on exertion and orthopnea.  She has noticed some increased pedal edema.  No chest pain, palpitations or syncope.  Note she decreased her Lasix to 20 mg daily by herself.  Current Outpatient Medications  Medication Sig Dispense Refill   Abatacept (ORENCIA IV) Inject into the vein every 28 (twenty-eight) days.     Acetaminophen (TYLENOL EXTRA STRENGTH PO) Take 1-2 tablets by mouth every 6 (six) hours as needed (pain).      Biotin 1000 MCG tablet Take 1,000 mcg by mouth daily.     Calcium Carbonate-Vitamin D 500-125 MG-UNIT TABS Take 1 tablet by mouth daily.      carvedilol (COREG) 3.125 MG tablet Take 1 tablet (3.125 mg total) by mouth 2 (two) times daily with a meal. 180 tablet 3   denosumab (PROLIA) 60 MG/ML SOSY injection Inject 60 mg into the skin every 6 (six) months.     estradiol (ESTRACE VAGINAL) 0.1 MG/GM vaginal cream Place one gram vaginally up to three times a week as needed to maintain comfort 42.5 g 12   furosemide (LASIX) 40 MG tablet Take 1 tablet (40 mg total) by mouth daily. 30 tablet 3   gabapentin (NEURONTIN) 300 MG capsule TAKE 1 CAPSULE BY MOUTH IN THE MORNING, 1 IN THE AFTERNOON, AND 2 AT BEDTIME 360 capsule 1   Glucosamine-Chondroit-Vit C-Mn (GLUCOSAMINE CHONDR 1500 COMPLX PO) Take 1 capsule by mouth daily.     HYDROcodone-acetaminophen (NORCO/VICODIN) 5-325 MG tablet Take 1 tablet by mouth 2 (two) times daily as needed for moderate pain. 30 tablet 0   levothyroxine (SYNTHROID) 75 MCG tablet TAKE 1 TABLET BY MOUTH DAILY BEFORE BREAKFAST. 90 tablet 1   methocarbamol (ROBAXIN) 500 MG tablet TAKE 1 TABLET BY MOUTH EVERY 6 HOURS AS NEEDED FOR MUSCLE SPASMS. 60 tablet 1   Multiple Vitamin (MULTIVITAMIN) tablet Take 1 tablet by mouth daily.     omeprazole (PRILOSEC) 20 MG capsule TAKE 1 CAPSULE BY MOUTH EVERY DAY 90 capsule 3   Probiotic Product (PROBIOTIC PO) Take 1 capsule by mouth daily.      sulfamethoxazole-trimethoprim (BACTRIM DS) 800-160 MG tablet Take 1 tablet by mouth 2 (two) times daily. 10 tablet 0   traMADol (ULTRAM) 50 MG tablet TAKE 1/2-1 TABLET BY MOUTH EVERY 8 HOURS AS NEEDED. DO  NOT COMBINE WITH OTHER PAIN MEDICATION 60 tablet 1   VITAMIN D PO Take by mouth daily.     No current facility-administered medications for this visit.     Past Medical History:  Diagnosis Date   Arthralgia of multiple joints    followed by dr Gerilyn Nestle   Arthritis    Cardiomyopathy ([place])    Chronic constipation    Chronic inflammatory arthritis    rhemotolgist-  dr a. Gerilyn Nestle ([facility])   Dry eyes    eye drops used    GERD  (gastroesophageal reflux disease)    H/O discoid lupus erythematosus    Hiatal hernia    History of colon polyps    Hypothyroidism    Iron deficiency anemia    LBBB (left bundle branch block) 2010   Mitchell's disease (erythromelalgia) [facility])    neurologist-  dr patel   Nocturia    Non-small cell cancer of right lung [facility]) surgeon-- dr gerhardt/  oncologist-  dr Julien Nordmann--- per lov notes no recurrence/   11-18-2017 per pt denies any symptoms   dx 2015--  Stage IIA (T2b,N0,M0) , +EGFR  mutation in exon 21, non-small cell adenocarcinoma right upper lobe---  s/p  Right upper lobectomy , right middley wedge resection and node dissection---  no chemo or radiation therapy   OA (osteoarthritis)    hands   Osteoporosis    PONV (postoperative nausea and vomiting)    likes phenergan   Raynaud's phenomenon 1965   Renal insufficiency    Rheumatoid arthritis ([place])    Sciatica    Scoliosis    Sjogren's syndrome [facility])     Past Surgical History:  Procedure Laterality Date   ANTERIOR HIP REVISION Right 11/27/2017   Procedure: RIGHT HIP ACETABULAR REVISION;  Surgeon: [name], MD;  Location: [facility];  Service: Orthopedics;  Laterality: Right;   ANTERIOR HIP REVISION Right 01/24/2018   Procedure: OPEN REDUCTION OF DISLOCATED ANTERIOR HIP WITH REVISION OF LINER AND HIP BALL;  Surgeon: [name], MD;  [facility]: [facility];  Service: Orthopedics;  Laterality: Right;   APPENDECTOMY  1950s   BIOPSY  04/14/2018   Procedure: BIOPSY;  Surgeon: Yetta Flock, MD;  Location: [place];  Service: Gastroenterology;;   BIOPSY  04/16/2018   Procedure: BIOPSY;  Surgeon: Irving Copas., MD;  Location: [place];  Service: Gastroenterology;;   CARDIOVASCULAR STRESS TEST  12/2008    mild fixed basal to mid septal perfusion defect felt likely due to artifact from LBBB, no ischemia, EF 58%   COLONOSCOPY     COLONOSCOPY WITH PROPOFOL N/A 04/16/2018   Procedure: COLONOSCOPY  WITH PROPOFOL;  Surgeon: Irving Copas., MD;  Location: [place];  Service: Gastroenterology;  Laterality: N/A;   ESOPHAGOGASTRODUODENOSCOPY (EGD) WITH PROPOFOL N/A 04/14/2018   Procedure: ESOPHAGOGASTRODUODENOSCOPY (EGD) WITH PROPOFOL;  Surgeon: Yetta Flock, MD;  Location: [place];  Service: Gastroenterology;  Laterality: N/A;   FEMORAL-POPLITEAL BYPASS GRAFT Right 04/10/2018   Procedure: REPAIR RIGHT FEMORAL ARTERY PSEUDOANEURYSM, RETROPERITONEAL EXPOSURE OF ILIAC ARTERY, RIGHT POPLITEAL EMBOLECTOMY;  Surgeon: Angelia Mould, MD;  Location: [place];  Service: Vascular;  Laterality: Right;   FLEXIBLE SIGMOIDOSCOPY N/A 06/17/2019   Procedure: FLEXIBLE SIGMOIDOSCOPY;  Surgeon: Lavena Bullion, DO;  [facility];  Service: Gastroenterology;  Laterality: N/A;   HEMOSTASIS CLIP PLACEMENT  06/17/2019   Procedure: HEMOSTASIS CLIP PLACEMENT;  Surgeon: Lavena Bullion, DO;  [facility];  Service: Gastroenterology;;   LYMPH NODE DISSECTION  Right 06/07/2013   Procedure: LYMPH NODE DISSECTION;  Surgeon: [name], MD;  Location: [place];  Service: Thoracic;  Laterality: Right;   PATCH ANGIOPLASTY Right 04/10/2018   Procedure: PATCH  ANGIOPLASTY OF RIGHT FEMORAL ARTERY USING BOVINE PATCH, PATCH ANGIOPLASTY OF RIGHT POPLITEAL ARTERY USING BOVINE PATCH;  Surgeon: Angelia Mould, MD;  Location: [place];  Service: Vascular;  Laterality: Right;   SCHLEROTHERAPY  06/17/2019   Procedure: [place] Ku OF VARICES;  Surgeon: Lavena Bullion, DO;  [facility];  Service: Gastroenterology;;   [place]   "large incision from chest to up to shoulder, the nerves were tied together, for raynaud's   THORACOTOMY  06/07/2013   Procedure: MINI/LIMITED THORACOTOMY; right middle lobe wedge resection;  Surgeon: [name], MD;  Location: [place];  Service: Thoracic;;   TONSILLECTOMY  child   TOTAL ABDOMINAL HYSTERECTOMY   1980's    W/ BSO   TOTAL HIP ARTHROPLASTY Right 04/28/2014   Procedure: RIGHT TOTAL HIP ARTHROPLASTY ANTERIOR APPROACH;  Surgeon: [name], MD;  [facility];  Service: Orthopedics;  Laterality: Right;   TRANSTHORACIC ECHOCARDIOGRAM  12/11/2008   ef [id number], grade 1 diastolic dysfunction/  mild LAE/  mild AR and MR/  trivial TR   VIDEO ASSISTED THORACOSCOPY (VATS)/WEDGE RESECTION Right 06/07/2013   Procedure: VIDEO ASSISTED THORACOSCOPY (VATS)/right upper lobectomy, On Q;  Surgeon: [name], MD;  Location: [place];  Service: Thoracic;  Laterality: Right;   VIDEO BRONCHOSCOPY N/A 06/07/2013   Procedure: VIDEO BRONCHOSCOPY;  Surgeon: [name], MD;  Location: [place];  Service: Thoracic;  Laterality: N/A;   VIDEO BRONCHOSCOPY WITH ENDOBRONCHIAL NAVIGATION N/A 05/04/2013   Procedure: VIDEO BRONCHOSCOPY WITH ENDOBRONCHIAL NAVIGATION;  Surgeon: [name], MD;  Location: [place];  Service: Thoracic;  Laterality: N/A;    Social History   Socioeconomic History   Marital status: Widowed    Spouse name: Not on file   Number of children: 2   Years of education: Not on file   Highest education level: Not on file  Occupational History   Occupation: n/a  Tobacco Use   Smoking status: Never    Passive exposure: Past   Smokeless tobacco: Never  Vaping Use   Vaping Use: Never used  Substance and Sexual Activity   Alcohol use: Not Currently   Drug use: Never   Sexual activity: Not Currently    Birth control/protection: Surgical  Other Topics Concern   Not on file  Social History Narrative   Lives with husband, daughter and grandchild local.   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   Social Determinants of Health   Financial Resource Strain: [place]  (05/07/2021)   Overall Financial Resource Strain (CARDIA)    Difficulty of Paying Living Expenses: Not hard at all  Food Insecurity: No Food Insecurity (05/07/2021)   Hunger Vital  Sign    Worried About Running Out of Food in the Last Year: Never true    [place] in the Last Year: Never true  Transportation Needs: No Transportation Needs (05/07/2021)   PRAPARE - Hydrologist (Medical): No    Lack of Transportation (Non-Medical): No  Physical Activity: Insufficiently Active (05/07/2021)   Exercise Vital Sign    Days of Exercise per Week: 3 days    Minutes of Exercise per Session: 30 min  Stress: No Stress Concern Present (05/07/2021)   [place]  Institute of Occupational Health - Occupational Stress Questionnaire    Feeling of Stress : Not at all  Social Connections: Moderately Integrated (05/07/2021)   Social Connection and Isolation Panel [NHANES]    Frequency of Communication with Friends and Family: More than three times a week    Frequency of Social Gatherings with Friends and Family: More than three times a week    Attends Religious Services: More than 4 times per year    Active Member of [employer] or Organizations: Yes    Attends Archivist Meetings: 1 to 4 times per year    Marital Status: Widowed  Intimate Partner Violence: Not At Risk (05/07/2021)   Humiliation, Afraid, Rape, and Kick questionnaire    Fear of Current or Ex-Partner: No    Emotionally Abused: No    Physically Abused: No    Sexually Abused: No    Family History  Problem Relation Age of Onset   Coronary artery disease Father    Colon cancer Father    Diabetes Father    Cancer Father        colon   Other Mother 79       MVA   Healthy Sister    Healthy Brother    Healthy Daughter    Hypothyroidism Daughter    Other Brother        killed in war   Pneumonia Sister    Healthy Daughter    Esophageal cancer Neg Hx    Kidney disease Neg Hx    Liver disease Neg Hx     ROS: no fevers or chills, productive cough, hemoptysis, dysphasia, odynophagia, melena, hematochezia, dysuria, hematuria, rash, seizure activity, orthopnea, PND, pedal edema,  claudication. Remaining systems are negative.  Physical Exam: Well-developed well-nourished in no acute distress.  Skin is warm and dry.  HEENT is normal.  Neck is supple.  Chest is clear to auscultation with normal expansion.  Cardiovascular exam is regular rate and rhythm.  Abdominal exam nontender or distended. No masses palpated. Extremities show 1+ edema. neuro grossly intact  A/P  1 cardiomyopathy-felt possibly secondary to dyssynchrony.  Given patient's age and ongoing lung cancer we have elected to treat conservatively.  Continue beta-blocker.  2 chronic combined systolic/diastolic congestive heart failure-during admission November 2023 for CHF there was discussion concerning MitraClip but also Tagrisso could also be contributing which was discontinued.  Given patient's age and progressive lung cancer I do not think pursuing MitraClip at this point would be reasonable.  We can consider if the prognosis from her lung cancer improves with her follow-up visits with oncology.  She is volume overloaded on examination but recently decreased her Lasix to 20 mg daily on her own.  Will resume 40 mg daily.  I also asked her to fluid restrict to 1 L daily.  Check potassium and renal function in 1 week.  She can also take an additional 20 mg of Lasix as needed for worsening dyspnea, edema or weight gain of 2 to 3 pounds.  3 palpitations-continue beta-blocker.  4 left bundle branch block  5 lung cancer-she is now off of Tagrisso.  Most recent scan suggest her lung cancer is progressing.  Follow-up oncology.  6 mitral vegetation-as outlined under #2.  Kirk Ruths, MD

## 2022-05-01 ENCOUNTER — Telehealth: Payer: Self-pay | Admitting: Family Medicine

## 2022-05-01 NOTE — Telephone Encounter (Signed)
Contacted [name] to schedule their annual wellness visit. Call back at later date: in 15 minutes today  [name]; [place]: [phone]

## 2022-05-01 NOTE — Telephone Encounter (Signed)
Copied from [place] [id number]. Topic: Medicare AWV >> May 01, 2022 10:38 AM Devoria Glassing wrote: Reason for CRM: Called patient to schedule Medicare Annual Wellness Visit (AWV). Left message for patient to call back and schedule Medicare Annual Wellness Visit (AWV).  Last date of AWV: 05/07/2021  Please schedule an appointment at any time with Beatris Ship, [place] .  If any questions, please contact me.  Thank you ,  Sherol Dade; [place] Direct Dial: [phone]

## 2022-05-07 ENCOUNTER — Ambulatory Visit: Payer: Medicare PPO | Attending: Cardiology | Admitting: Cardiology

## 2022-05-07 ENCOUNTER — Encounter: Payer: Self-pay | Admitting: Cardiology

## 2022-05-07 VITALS — BP 124/62 | HR 95 | Ht 60.0 in | Wt 112.0 lb

## 2022-05-07 DIAGNOSIS — I340 Nonrheumatic mitral (valve) insufficiency: Secondary | ICD-10-CM

## 2022-05-07 DIAGNOSIS — I5040 Unspecified combined systolic (congestive) and diastolic (congestive) heart failure: Secondary | ICD-10-CM | POA: Diagnosis not present

## 2022-05-07 DIAGNOSIS — N1831 Chronic kidney disease, stage 3a: Secondary | ICD-10-CM

## 2022-05-07 DIAGNOSIS — I420 Dilated cardiomyopathy: Secondary | ICD-10-CM | POA: Diagnosis not present

## 2022-05-07 NOTE — Patient Instructions (Signed)
Medication Instructions:   INCREASE FUROSEMIDE TO 40 MG ONCE DAILY  *If you need a refill on your cardiac medications before your next appointment, please call your pharmacy*   Lab Work:  Your physician recommends that you return for lab work in: ONE WEEK-DO NOT NEED TO FAST  [place] on the 3 rd floor in [address] Hours-Monday - Friday 8 am-11:30 am and 1 pm -4 pm  If you have labs (blood work) drawn today and your tests are completely normal, you will receive your results only by: [employer] (if you have MyChart) OR A paper copy in the mail If you have any lab test that is abnormal or we need to change your treatment, we will call you to review the results.   Follow-Up: At [facility], you and your health needs are our priority.  As part of our continuing mission to provide you with exceptional heart care, we have created designated Provider Care Teams.  These Care Teams include your primary Cardiologist (physician) and Advanced Practice Providers (APPs -  Physician Assistants and Nurse Practitioners) who all work together to provide you with the care you need, when you need it.  We recommend signing up for the patient portal called "MyChart".  Sign up information is provided on this After Visit Summary.  MyChart is used to connect with patients for Virtual Visits (Telemedicine).  Patients are able to view lab/test results, encounter notes, upcoming appointments, etc.  Non-urgent messages can be sent to your provider as well.   To learn more about what you can do with MyChart, go to [email or web address].    Your next appointment:   8 week(s)  Provider:   Kirk Ruths, MD

## 2022-05-08 ENCOUNTER — Telehealth: Payer: Self-pay | Admitting: Pharmacist

## 2022-05-08 NOTE — Telephone Encounter (Signed)
Received notification that Prolia came from November administration is rejecting. Appears Prolia now requires pre-certification through [facility] for buy-and-bill  Submitted via phone and backdated to 01/15/22. Clinicals sent via fax  Case # [id number] Phone: [phone] Fax: [phone]  Knox Saliva, PharmD, MPH, BCPS, CPP Clinical Pharmacist (Rheumatology and Pulmonology)

## 2022-05-08 NOTE — Telephone Encounter (Signed)
Pre-certification for Prolia [id number]) approved by [facility] from 01/15/22 through 02/17/2023.  EOC # [id number]  Knox Saliva, PharmD, MPH, BCPS, CPP Clinical Pharmacist (Rheumatology and Pulmonology)

## 2022-05-11 ENCOUNTER — Telehealth: Payer: Self-pay | Admitting: Family Medicine

## 2022-05-11 DIAGNOSIS — M25512 Pain in left shoulder: Secondary | ICD-10-CM

## 2022-05-12 NOTE — Telephone Encounter (Signed)
PDMP reviewed, on chronic tramadol, Rx sent.

## 2022-05-12 NOTE — Telephone Encounter (Signed)
Requesting: tramadol 50mg   Contract: None UDS: None Last Visit: 01/02/22 Next Visit: None Last Refill: 03/03/22 #60 and 1rf  Please Advise

## 2022-05-13 ENCOUNTER — Telehealth: Payer: Self-pay

## 2022-05-13 NOTE — Telephone Encounter (Signed)
-----   Message from Vladimir Crofts, [place] sent at 05/13/2022 10:10 AM EDT ----- Regarding: RE:  Hey!  We can put her in at 315 Thursday. Thanks!  Estill Bamberg  ----- Message ----- From: Algernon Huxley, RN Sent: 05/13/2022  10:05 AM EDT To: Vladimir Crofts, PA-C; Willia Craze, NP; #  Good morning,  See note below from Dr. Tracie Harrier. Please let me know if you can add her on one day.  Thanks, Vaughan Basta ----- Message ----- From: Lavena Bullion, DO Sent: 05/12/2022   4:26 PM EDT To: Algernon Huxley, RN; Howell Pringle, CMA  I responded to Dr. Lorelei Pont separately, but I am not in the office the rest of this week and most of next week.  Can we see if one of the APP's can get this very nice lady seen this week?  Thank you!   ----- Message ----- From: Darreld Mclean, MD Sent: 05/12/2022   4:17 PM EDT To: Lavena Bullion, DO  Hi Vito- you may remember this terrific lady who you saw for hemorrhoids a few years ago?  She is having a lot of diarrhea and anal pain- [place] and cannot see her this week. She called and was offered an appt in May- do you have any magic to get her seen sooner?  She would be happy to see an extender as well Sorry to put this on you, but I have no phone service to call your office!  Thank you!!   JC

## 2022-05-13 NOTE — Telephone Encounter (Signed)
Pt scheduled to see Vicie Mutters PA 05/15/22 at 3:15pm. Pt aware of appt.

## 2022-05-14 ENCOUNTER — Ambulatory Visit (HOSPITAL_COMMUNITY)
Admission: RE | Admit: 2022-05-14 | Discharge: 2022-05-14 | Disposition: A | Discharge status: Home / Self Care | Payer: Medicare PPO | Source: Ambulatory Visit | Attending: Rheumatology | Admitting: Rheumatology

## 2022-05-14 ENCOUNTER — Other Ambulatory Visit: Payer: Self-pay | Admitting: Pharmacist

## 2022-05-14 DIAGNOSIS — Z79899 Other long term (current) drug therapy: Secondary | ICD-10-CM | POA: Insufficient documentation

## 2022-05-14 DIAGNOSIS — M0609 Rheumatoid arthritis without rheumatoid factor, multiple sites: Secondary | ICD-10-CM

## 2022-05-14 DIAGNOSIS — Z111 Encounter for screening for respiratory tuberculosis: Secondary | ICD-10-CM

## 2022-05-14 LAB — CBC WITH DIFFERENTIAL/PLATELET
Abs Immature Granulocytes: 0.08 10*3/uL — ABNORMAL HIGH (ref 0.00–0.07)
Basophils Absolute: 0.1 10*3/uL (ref 0.0–0.1)
Basophils Relative: 1 %
Eosinophils Absolute: 0 10*3/uL (ref 0.0–0.5)
Eosinophils Relative: 0 %
HCT: 33.7 % — ABNORMAL LOW (ref 36.0–46.0)
Hemoglobin: 10.4 g/dL — ABNORMAL LOW (ref 12.0–15.0)
Immature Granulocytes: 1 %
Lymphocytes Relative: 10 %
Lymphs Abs: 0.9 10*3/uL (ref 0.7–4.0)
MCH: 28.3 pg (ref 26.0–34.0)
MCHC: 30.9 g/dL (ref 30.0–36.0)
MCV: 91.8 fL (ref 80.0–100.0)
Monocytes Absolute: 1.2 10*3/uL — ABNORMAL HIGH (ref 0.1–1.0)
Monocytes Relative: 13 %
Neutro Abs: 6.7 10*3/uL (ref 1.7–7.7)
Neutrophils Relative %: 75 %
Platelets: 316 10*3/uL (ref 150–400)
RBC: 3.67 MIL/uL — ABNORMAL LOW (ref 3.87–5.11)
RDW: 14 % (ref 11.5–15.5)
WBC: 8.9 10*3/uL (ref 4.0–10.5)
nRBC: 0 % (ref 0.0–0.2)

## 2022-05-14 LAB — COMPREHENSIVE METABOLIC PANEL
ALT: 15 U/L (ref 0–44)
AST: 26 U/L (ref 15–41)
Albumin: 3.2 g/dL — ABNORMAL LOW (ref 3.5–5.0)
Alkaline Phosphatase: 58 U/L (ref 38–126)
Anion gap: 10 (ref 5–15)
BUN: 20 mg/dL (ref 8–23)
CO2: 26 mmol/L (ref 22–32)
Calcium: 8.8 mg/dL — ABNORMAL LOW (ref 8.9–10.3)
Chloride: 95 mmol/L — ABNORMAL LOW (ref 98–111)
Creatinine, Ser: 1.2 mg/dL — ABNORMAL HIGH (ref 0.44–1.00)
GFR, Estimated: 43 mL/min — ABNORMAL LOW (ref >60)
Glucose, Bld: 103 mg/dL — ABNORMAL HIGH (ref 70–99)
Potassium: 4.3 mmol/L (ref 3.5–5.1)
Sodium: 131 mmol/L — ABNORMAL LOW (ref 135–145)
Total Bilirubin: 0.5 mg/dL (ref 0.3–1.2)
Total Protein: 6.2 g/dL — ABNORMAL LOW (ref 6.5–8.1)

## 2022-05-14 MED ORDER — ACETAMINOPHEN 325 MG PO TABS: 650.0000 mg | ORAL_TABLET | ORAL | Status: DC

## 2022-05-14 MED ORDER — SODIUM CHLORIDE 0.9 % IV SOLN
500.0000 mg | INTRAVENOUS | Status: DC
  Administered 2022-05-14: 500 mg via INTRAVENOUS
  Filled 2022-05-14: qty 20

## 2022-05-14 MED ORDER — DIPHENHYDRAMINE HCL 25 MG PO CAPS: 25.0000 mg | ORAL_CAPSULE | ORAL | Status: DC

## 2022-05-14 NOTE — Progress Notes (Unsigned)
05/14/2022 [name] [id number] 27-Nov-1930  Referring provider: Darreld Mclean, MD Primary GI doctor: Dr. Bryan Lemma  ASSESSMENT AND PLAN:   There are no diagnoses linked to this encounter.   Patient Care Team: Copland, Gay Filler, MD as PCP - General (Family Medicine) Stanford Breed, Denice Bors, MD as PCP - Cardiology (Cardiology) Rigoberto Noel, MD as Consulting Physician (Pulmonary Disease) [name], MD as Consulting Physician (Internal Medicine) Curt Bears, MD as Consulting Physician (Oncology) [name], MD (Inactive) as Consulting Physician (Cardiothoracic Surgery) Zonia Kief, MD as Consulting Physician (Rehabilitation) Bo Merino, MD as Consulting Physician (Rheumatology)  HISTORY OF PRESENT ILLNESS: 89 y.o. female with a past medical history of GERD, Sjogren's syndrome, RA, Raynaud's, chronic constipation, discoid lupus, LBBB, Mitchell's disease, non-small cell cancer of right lung in 2015, OA, GERD, constipation, hemorrhoids status post banding with subsequent rectal ulcer and others listed below presents for evaluation of diarrhea and rectal bleeding.   Colonoscopy (04/2018, Dr. Rush Landmark): Grade 2 internal hemorrhoids, 3 small tubular adenomas EGD (03/2018, Dr. Havery Moros): 7 cm HH, tortuous esophagus, localized nodularity at [facility] (biopsy: H. pylori gastritis-treated as inpatient), Normal stomach/duodenum -03/21/2019: Successful banding of the RA hemorrhoid -04/26/2019: Successful banding of the LL hemorrhoid -05/31/2019: Successful banding of the RP hemorrhoid 06/07/2019 flex sigmoidoscopy during hospitalization for onset of painless hematochezia showed a single/solitary ulcer in the distal rectum successfully treated with hemostatic clips and epinephrine injection.  Hemoglobin 6.3 transfuse 2 PRBCs discharge 10.9   04/22/2022 CT chest without contrast for non-small cell lung cancer showed mild enlargement of numerous scattered solid and  subsolid nodules in both lungs suspicious for low-grade multifocal adenocarcinoma previous right pleural effusion resolved.  Moderate hiatal hernia 04/24/2022 office visit with Dr. Julien Nordmann for follow-up of her non-small cell right lung cancer 2015, status post wedge resection, was started on 1 month of chemotherapy agent but had possible pneumonitis and pleural effusion.  This was discontinued. At this office visit patient was offered a different agent called Erlotinib  She was treated recently for UTI prior.   At that visit was denying nausea, vomiting diarrhea or constipation. Patient has history of chronic constipation but has been having diarrhea with rectal pain.   She  reports that she has never smoked. She has been exposed to tobacco smoke. She has never used smokeless tobacco. She reports that she does not currently use alcohol. She reports that she does not use drugs.  RELEVANT LABS AND IMAGING: CBC    Component Value Date/Time   WBC 8.9 05/14/2022 1050   RBC 3.67 (L) 05/14/2022 1050   HGB 10.4 (L) 05/14/2022 1050   HGB 11.4 (L) 04/22/2022 1259   HGB 11.0 (L) 08/21/2016 1125   HCT 33.7 (L) 05/14/2022 1050   HCT 33.6 (L) 08/21/2016 1125   PLT 316 05/14/2022 1050   PLT 205 04/22/2022 1259   PLT 160 08/21/2016 1125   PLT 150 09/22/2008 0000   MCV 91.8 05/14/2022 1050   MCV 88.9 08/21/2016 1125   MCH 28.3 05/14/2022 1050   MCHC 30.9 05/14/2022 1050   RDW 14.0 05/14/2022 1050   RDW 13.0 08/21/2016 1125   LYMPHSABS 0.9 05/14/2022 1050   LYMPHSABS 1.0 08/21/2016 1125   MONOABS 1.2 (H) 05/14/2022 1050   MONOABS 0.5 08/21/2016 1125   EOSABS 0.0 05/14/2022 1050   EOSABS 0.0 08/21/2016 1125   BASOSABS 0.1 05/14/2022 1050   BASOSABS 0.0 08/21/2016 1125   Recent Labs    12/25/21 0229 12/26/21 0251 12/27/21  0350 12/28/21 0516 12/29/21 0418 01/02/22 1625 01/22/22 0932 03/12/22 1048 04/22/22 1259 05/14/22 1050  HGB 9.5* 9.2* 9.4* 10.0* 9.7* 10.0* 10.6* 11.6* 11.4* 10.4*      CMP     Component Value Date/Time   NA 131 (L) 05/14/2022 1050   NA 130 (L) 04/26/2021 0000   NA 130 (L) 08/21/2016 1125   K 4.3 05/14/2022 1050   K 4.8 08/21/2016 1125   CL 95 (L) 05/14/2022 1050   CO2 26 05/14/2022 1050   CO2 26 08/21/2016 1125   GLUCOSE 103 (H) 05/14/2022 1050   GLUCOSE 82 08/21/2016 1125   GLUCOSE 85 09/22/2008 0000   BUN 20 05/14/2022 1050   BUN 27 04/26/2021 0000   BUN 25.6 08/21/2016 1125   CREATININE 1.20 (H) 05/14/2022 1050   CREATININE 1.70 (H) 04/22/2022 1259   CREATININE 0.98 (H) 05/21/2020 1341   CREATININE 1.2 (H) 08/21/2016 1125   CALCIUM 8.8 (L) 05/14/2022 1050   CALCIUM 10.0 08/21/2016 1125   PROT 6.2 (L) 05/14/2022 1050   PROT 7.4 08/21/2016 1125   ALBUMIN 3.2 (L) 05/14/2022 1050   ALBUMIN 4.0 08/21/2016 1125   AST 26 05/14/2022 1050   AST 25 04/22/2022 1259   AST 27 08/21/2016 1125   ALT 15 05/14/2022 1050   ALT 17 04/22/2022 1259   ALT 13 08/21/2016 1125   ALKPHOS 58 05/14/2022 1050   ALKPHOS 54 08/21/2016 1125   BILITOT 0.5 05/14/2022 1050   BILITOT 0.2 (L) 04/22/2022 1259   BILITOT 0.31 08/21/2016 1125   GFRNONAA 43 (L) 05/14/2022 1050   GFRNONAA 28 (L) 04/22/2022 1259   GFRNONAA 52 (L) 05/21/2020 1341   GFRAA 60 05/21/2020 1341      Latest Ref Rng & Units 05/14/2022   10:50 AM 04/22/2022   12:59 PM 03/12/2022   10:48 AM  Hepatic Function  Total Protein 6.5 - 8.1 g/dL 6.2  7.4  6.8   Albumin 3.5 - 5.0 g/dL 3.2  4.3  4.0   AST 15 - 41 U/L 26  25  31    ALT 0 - 44 U/L 15  17  14    Alk Phosphatase 38 - 126 U/L 58  51  36   Total Bilirubin 0.3 - 1.2 mg/dL 0.5  0.2  0.5       Current Medications:   Current Outpatient Medications (Endocrine & Metabolic):    denosumab (PROLIA) 60 MG/ML SOSY injection, Inject 60 mg into the skin every 6 (six) months.   levothyroxine (SYNTHROID) 75 MCG tablet, TAKE 1 TABLET BY MOUTH DAILY BEFORE BREAKFAST.   Current Outpatient Medications (Cardiovascular):    carvedilol (COREG) 3.125  MG tablet, Take 1 tablet (3.125 mg total) by mouth 2 (two) times daily with a meal.   furosemide (LASIX) 40 MG tablet, Take 1 tablet (40 mg total) by mouth daily.     Facility-Administered Medications Ordered in Other Visits (Respiratory):    diphenhydrAMINE (BENADRYL) capsule 25 mg  Current Outpatient Medications (Analgesics):    Abatacept (ORENCIA IV), Inject into the vein every 28 (twenty-eight) days.   Acetaminophen (TYLENOL EXTRA STRENGTH PO), Take 1-2 tablets by mouth every 6 (six) hours as needed (pain).    HYDROcodone-acetaminophen (NORCO/VICODIN) 5-325 MG tablet, Take 1 tablet by mouth 2 (two) times daily as needed for moderate pain.   traMADol (ULTRAM) 50 MG tablet, TAKE 1/2-1 TABLET BY MOUTH EVERY 8 HOURS AS NEEDED. DO NOT COMBINE WITH OTHER PAIN MEDICATION   Facility-Administered Medications Ordered in Other Visits (Analgesics):  abatacept (ORENCIA) 500 mg in sodium chloride 0.9 % 100 mL IVPB   acetaminophen (TYLENOL) tablet 650 mg    Current Outpatient Medications (Other):    Biotin 1000 MCG tablet, Take 1,000 mcg by mouth daily.   Calcium Carbonate-Vitamin D 500-125 MG-UNIT TABS, Take 1 tablet by mouth daily.   estradiol (ESTRACE VAGINAL) 0.1 MG/GM vaginal cream, Place one gram vaginally up to three times a week as needed to maintain comfort   gabapentin (NEURONTIN) 300 MG capsule, TAKE 1 CAPSULE BY MOUTH IN THE MORNING, 1 IN THE AFTERNOON, AND 2 AT BEDTIME   Glucosamine-Chondroit-Vit C-Mn (GLUCOSAMINE CHONDR 1500 COMPLX PO), Take 1 capsule by mouth daily.   methocarbamol (ROBAXIN) 500 MG tablet, TAKE 1 TABLET BY MOUTH EVERY 6 HOURS AS NEEDED FOR MUSCLE SPASMS.   Multiple Vitamin (MULTIVITAMIN) tablet, Take 1 tablet by mouth daily.   omeprazole (PRILOSEC) 20 MG capsule, TAKE 1 CAPSULE BY MOUTH EVERY DAY   Probiotic Product (PROBIOTIC PO), Take 1 capsule by mouth daily.    sulfamethoxazole-trimethoprim (BACTRIM DS) 800-160 MG tablet, Take 1 tablet by mouth 2 (two) times  daily.   VITAMIN D PO, Take by mouth daily.  No current facility-administered medications for this visit.  Medical History:  Past Medical History:  Diagnosis Date   Arthralgia of multiple joints    followed by dr Gerilyn Nestle   Arthritis    Cardiomyopathy ([place])    Chronic constipation    Chronic inflammatory arthritis    rhemotolgist-  dr a. Gerilyn Nestle ([facility])   Dry eyes    eye drops used    GERD (gastroesophageal reflux disease)    H/O discoid lupus erythematosus    Hiatal hernia    History of colon polyps    Hypothyroidism    Iron deficiency anemia    LBBB (left bundle branch block) 2010   Mitchell's disease (erythromelalgia) [facility])    neurologist-  dr patel   Nocturia    Non-small cell cancer of right lung [facility]) surgeon-- dr gerhardt/  oncologist-  dr Julien Nordmann--- per lov notes no recurrence/   11-18-2017 per pt denies any symptoms   dx 2015--  Stage IIA (T2b,N0,M0) , +EGFR  mutation in exon 21, non-small cell adenocarcinoma right upper lobe---  s/p  Right upper lobectomy , right middley wedge resection and node dissection---  no chemo or radiation therapy   OA (osteoarthritis)    hands   Osteoporosis    PONV (postoperative nausea and vomiting)    likes phenergan   Raynaud's phenomenon 1965   Renal insufficiency    Rheumatoid arthritis ([place])    Sciatica    Scoliosis    Sjogren's syndrome ([place])    Allergies:  Allergies  Allergen Reactions   Amlodipine Rash   Prochlorperazine Edisylate Anaphylaxis    Compazine--- tongue swells and rash   Aspirin Other (See Comments)    nose bleeds. Cannot take NSAIDS    Cymbalta [Duloxetine Hcl] Diarrhea, Nausea And Vomiting and Other (See Comments)    Increased blood pressure   Pamelor [Nortriptyline Hcl] Diarrhea and Nausea Only    Increased Heart rate and BP     Surgical History:  She  has a past surgical history that includes Thoracic sympathetectomy (1965); Colonoscopy; Cardiovascular stress test (12/2008); Video  bronchoscopy with endobronchial navigation (N/A, 05/04/2013); Video bronchoscopy (N/A, 06/07/2013); Video assisted thoracoscopy (vats)/wedge resection (Right, 06/07/2013); Thoracotomy (06/07/2013); Lymph node dissection (Right, 06/07/2013); Total hip arthroplasty (Right, 04/28/2014); transthoracic echocardiogram (12/11/2008); Total abdominal hysterectomy (1980's ); Appendectomy (1950s);  Tonsillectomy (child); Anterior hip revision (Right, 11/27/2017); Anterior hip revision (Right, 01/24/2018); Femoral-popliteal Bypass Graft (Right, 04/10/2018); Patch angioplasty (Right, 04/10/2018); Esophagogastroduodenoscopy (egd) with propofol (N/A, 04/14/2018); biopsy (04/14/2018); Colonoscopy with propofol (N/A, 04/16/2018); biopsy (04/16/2018); Flexible sigmoidoscopy (N/A, 06/17/2019); Schlerotherapy (06/17/2019); and Hemostasis clip placement (06/17/2019). Family History:  Her family history includes Cancer in her father; Colon cancer in her father; Coronary artery disease in her father; Diabetes in her father; Healthy in her brother, daughter, daughter, and sister; Hypothyroidism in her daughter; Other in her brother; Other (age of onset: 68) in her mother; Pneumonia in her sister.  REVIEW OF SYSTEMS  : All other systems reviewed and negative except where noted in the History of Present Illness.  PHYSICAL EXAM: There were no vitals taken for this visit. General Appearance: Well nourished, in no apparent distress. Head:   Normocephalic and atraumatic. Eyes:  sclerae anicteric,conjunctive pink  Respiratory: Respiratory effort normal, BS equal bilaterally without rales, rhonchi, wheezing. Cardio: RRR with no MRGs. Peripheral pulses intact.  Abdomen: Soft,  {BlankSingle:19197::"Flat","Obese","Non-distended"} ,active bowel sounds. {actendernessAB:27319} tenderness {anatomy; site abdomen:5010}. {BlankMultiple:19196::"Without guarding","With guarding","Without rebound","With rebound"}. No masses. Rectal:  {acrectalexam:27461} Musculoskeletal: Full ROM, {PSY - GAIT AND STATION:22860} gait. {With/Without:304960234} edema. Skin:  Dry and intact without significant lesions or rashes Neuro: Alert and  oriented x4;  No focal deficits. Psych:  Cooperative. Normal mood and affect.    Vladimir Crofts, PA-C 1:34 PM

## 2022-05-14 NOTE — Progress Notes (Signed)
Sodium and chloride are low and stable.  Creatinine is elevated and stable.  Calcium is low due to low albumin.Please forward results to her PCP.

## 2022-05-14 NOTE — Progress Notes (Signed)
Next infusion scheduled for Orencia IV on 06/11/22 and due for updated orders. Diagnosis: RA  Dose:  every 28 days (appropriate based on last recorded weight of 50.3kg)  Last Clinic Visit: 04/01/22 Next Clinic Visit: not scheduled but due July 2024  Last infusion: 05/14/22  Labs: CBC and CMP on 05/14/22 - Sodium and chloride are low and stable. Creatinine is elevated and stable.  Calcium is low due to low albumin  TB Gold: negative on 07/10/21   Orders placed for Orencia IV x 3 doses along with premedication of acetaminophen and diphenhydramine to be administered 30 minutes before medication infusion.  Standing CBC with diff/platelet and CMP with GFR orders placed to be drawn every 2 months.  Next TB gold due 07/11/22  Chesley Mires, PharmD, MPH, BCPS, CPP Clinical Pharmacist (Rheumatology and Pulmonology)

## 2022-05-15 ENCOUNTER — Ambulatory Visit: Payer: Medicare PPO | Admitting: Physician Assistant

## 2022-05-15 ENCOUNTER — Telehealth: Payer: Self-pay

## 2022-05-15 ENCOUNTER — Encounter: Payer: Self-pay | Admitting: Physician Assistant

## 2022-05-15 ENCOUNTER — Other Ambulatory Visit: Payer: Medicare PPO

## 2022-05-15 VITALS — BP 128/70 | HR 87 | Ht 60.0 in | Wt 111.0 lb

## 2022-05-15 DIAGNOSIS — K602 Anal fissure, unspecified: Secondary | ICD-10-CM | POA: Diagnosis not present

## 2022-05-15 DIAGNOSIS — K641 Second degree hemorrhoids: Secondary | ICD-10-CM

## 2022-05-15 DIAGNOSIS — D649 Anemia, unspecified: Secondary | ICD-10-CM | POA: Diagnosis not present

## 2022-05-15 DIAGNOSIS — R197 Diarrhea, unspecified: Secondary | ICD-10-CM | POA: Diagnosis not present

## 2022-05-15 MED ORDER — NON FORMULARY: 1 refills | Status: DC

## 2022-05-15 NOTE — Telephone Encounter (Signed)
Returned call to patient and made her aware of the below from Dr. Stanford Breed. Patient verbalized understanding.

## 2022-05-15 NOTE — Patient Instructions (Addendum)
You have been scheduled for an appointment with Dr. Bryan Lemma on 07/23/22 at 1;40 AM . Please arrive 10 minutes early for your appointment.   Your provider has requested that you go to the basement level for lab work before leaving today. Press "B" on the elevator. The lab is located at the first door on the left as you exit the elevator.   Anal Fissure, Adult  Diltiazem/lidocaine 3 x daily for 2 months sent to compound pharmacy .    Sent this medication to a compound pharmacy:  [facility] [address], [place], [place] [address]  [phone]  Please DO NOT go directly from our office to pick up this medication! Give the pharmacy 1 day to process the prescription. Extra time is required for them to compound your medication.  Follow up should symptoms persist for secondary evaluation and possible surgical referral for repair.  Please avoid milk products, raw fruits, raw vegetables, high fat foods, artifical sweeteners, and carbonated beverages until symptoms resolve Add on fiber supplement Do BRAT diet Go to the ER if any severe abdominal pain, fever, or weakness  Bland Diet A bland diet consists of foods that are often soft and do not have a lot of fat, fiber, or extra seasonings. Foods without fat, fiber, or seasoning are easier for the body to digest. They are also less likely to irritate your mouth, throat, stomach, and other parts of your digestive system. A bland diet is sometimes called a BRAT diet. What is my plan? Your health care provider or food and nutrition specialist (dietitian) may recommend specific changes to your diet to prevent symptoms or to treat your symptoms. These changes may include: Eating small meals often. Cooking food until it is soft enough to chew easily. Chewing your food well. Drinking fluids slowly. Not eating foods that are very spicy, sour, or fatty. Not eating citrus fruits, such as oranges and grapefruit. What do I need to know  about this diet? Eat a variety of foods from the bland diet food list. Do not follow a bland diet longer than needed. Ask your health care provider whether you should take vitamins or supplements. What foods can I eat? Grains Hot cereals, such as cream of wheat. Rice. Bread, crackers, or tortillas made from refined white flour. Vegetables Canned or cooked vegetables. Mashed or boiled potatoes. Fruits Bananas. Applesauce. Other types of cooked or canned fruit with the skin and seeds removed, such as canned peaches or pears. Meats and other proteins Scrambled eggs. Creamy peanut butter or other nut butters. Lean, well-cooked meats, such as chicken or fish. Tofu. Soups or broths. Dairy Low-fat dairy products, such as milk, cottage cheese, or yogurt. Beverages Water. Herbal tea. Apple juice. Fats and oils Mild salad dressings. Canola or olive oil. Sweets and desserts Pudding. Custard. Fruit gelatin. Ice cream. The items listed above may not be a complete list of recommended foods and beverages. Contact a dietitian for more options. What foods are not recommended? Grains Whole grain breads and cereals. Vegetables Raw vegetables. Fruits Raw fruits, especially citrus, berries, or dried fruits. Dairy Whole fat dairy foods. Beverages Caffeinated drinks. Alcohol. Seasonings and condiments Strongly flavored seasonings or condiments. Hot sauce. Salsa. Other foods Spicy foods. Fried foods. Sour foods, such as pickled or fermented foods. Foods with high sugar content. Foods high in fiber. The items listed above may not be a complete list of foods and beverages to avoid. Contact a dietitian for more information. Summary A bland  diet consists of foods that are often soft and do not have a lot of fat, fiber, or extra seasonings. Foods without fat, fiber, or seasoning are easier for the body to digest. Check with your health care provider to see how long you should follow this diet plan. It is  not meant to be followed for long periods. This information is not intended to replace advice given to you by your health care provider. Make sure you discuss any questions you have with your health care provider. Document Revised: 03/04/2017 Document Reviewed: 03/04/2017 Elsevier Patient Education  2022 [place] you for choosing me and [facility].  Vicie Mutters, PA-C

## 2022-05-15 NOTE — Telephone Encounter (Signed)
-----   Message from Lelon Perla, MD sent at 05/14/2022  3:50 PM EDT ----- Labs ok; continue present meds and no need for additional labs [facility]  ----- Message ----- From: Darreld Mclean, MD Sent: 05/14/2022   3:48 PM EDT To: Lelon Perla, MD  Hi Jarvis Newcomer was trying to send this message to you guys but said it would not go through for some reason   Neoma Laming ... I had extensive blood work done at [facility] this morning and the results are posted on MyChart   I haven't had the blood work done for Dr. Stanford Breed yet: so I would appreciate it if you would take a look at it and see if he can see what he needs to see from that without my having to go over to the [facility] lab and have more blood drawn!!  Thanks! Legrand Rams

## 2022-05-16 ENCOUNTER — Telehealth: Payer: Self-pay | Admitting: Internal Medicine

## 2022-05-16 DIAGNOSIS — A0472 Enterocolitis due to Clostridium difficile, not specified as recurrent: Secondary | ICD-10-CM

## 2022-05-16 MED ORDER — VANCOMYCIN HCL 125 MG PO CAPS: 125.0000 mg | ORAL_CAPSULE | Freq: Four times a day (QID) | ORAL | 0 refills | Status: AC

## 2022-05-16 NOTE — Telephone Encounter (Signed)
C. difficile positive, I received a call from [employer]  I called and spoke to patient directly by phone  I am going to call in vancomycin 125 mg QID x 10 days Will call this to her [facility] [address]  I asked her to call me back if this prescription was cost prohibitive  She is very happy to have the diagnosis  I will copy her primary GI team Dr. Bryan Lemma, Vicie Mutters, PA-C and her primary provider Dr. Lorelei Pont

## 2022-05-17 ENCOUNTER — Other Ambulatory Visit: Payer: Self-pay | Admitting: Family Medicine

## 2022-05-20 ENCOUNTER — Other Ambulatory Visit: Payer: Self-pay

## 2022-05-22 NOTE — Progress Notes (Signed)
Agree with the assessment and plan as outlined by Amanda Collier, PA-C. ? ?Shloma Roggenkamp, DO, FACG ? ?

## 2022-05-28 ENCOUNTER — Telehealth: Payer: Self-pay | Admitting: Internal Medicine

## 2022-05-28 NOTE — Telephone Encounter (Signed)
Called patient regarding June appointments, patient is notified.

## 2022-05-29 ENCOUNTER — Encounter: Payer: Self-pay | Admitting: *Deleted

## 2022-06-03 MED ORDER — HYDROCODONE-ACETAMINOPHEN 5-325 MG PO TABS: 1.0000 | ORAL_TABLET | Freq: Two times a day (BID) | ORAL | 0 refills | Status: DC | PRN

## 2022-06-03 NOTE — Addendum Note (Signed)
Addended by: Abbe Amsterdam C on: 06/03/2022 08:08 AM   Modules accepted: Orders

## 2022-06-11 ENCOUNTER — Ambulatory Visit (HOSPITAL_COMMUNITY)
Admission: RE | Admit: 2022-06-11 | Discharge: 2022-06-11 | Disposition: A | Discharge status: Home / Self Care | Payer: Medicare PPO | Source: Ambulatory Visit | Attending: Rheumatology | Admitting: Rheumatology

## 2022-06-11 ENCOUNTER — Other Ambulatory Visit: Payer: Self-pay | Admitting: Pharmacist

## 2022-06-11 DIAGNOSIS — M810 Age-related osteoporosis without current pathological fracture: Secondary | ICD-10-CM

## 2022-06-11 DIAGNOSIS — Z79899 Other long term (current) drug therapy: Secondary | ICD-10-CM | POA: Diagnosis not present

## 2022-06-11 DIAGNOSIS — M0609 Rheumatoid arthritis without rheumatoid factor, multiple sites: Secondary | ICD-10-CM

## 2022-06-11 MED ORDER — SODIUM CHLORIDE 0.9 % IV SOLN
500.0000 mg | INTRAVENOUS | Status: DC
  Administered 2022-06-11: 500 mg via INTRAVENOUS
  Filled 2022-06-11: qty 20

## 2022-06-11 MED ORDER — DIPHENHYDRAMINE HCL 25 MG PO CAPS: 25.0000 mg | ORAL_CAPSULE | ORAL | Status: DC

## 2022-06-11 MED ORDER — ACETAMINOPHEN 325 MG PO TABS: 650.0000 mg | ORAL_TABLET | ORAL | Status: DC

## 2022-06-11 NOTE — Progress Notes (Signed)
Next Prolia SQ due on 07/09/22. She will receive with Orencia infusion that same day Diagnosis: age-related osteoporosis  Dose: 60 mg SQ every 6 months  Last Clinic Visit: 04/01/22 Next Clinic Visit: not scheduled  Last Prolia dose: 01/15/22  Labs: CBC, CMP 05/14/22 DEXA 01/28/2019: The BMD measured at Femur Neck is 0.636 g/cm2 with a T-score of -2.9. Her last prolia injection was administered on 12/20/2020.   Orders placed for Prolia x 1 dose. No premedicatons required.   Chesley Mires, PharmD, MPH, BCPS, CPP Clinical Pharmacist (Rheumatology and Pulmonology)

## 2022-06-12 NOTE — Progress Notes (Signed)
HPI: FU CHF. Nuclear study 2010 showed ejection fraction 58% with fixed septal defect related to left bundle branch block and no ischemia.  Echocardiogram February 2020 showed normal LV systolic function grade 1 diastolic dysfunction, mild left atrial enlargement, mild aortic insufficiency.  Monitor April 2021 showed occasional PVC and brief episode of SVT.  Patient has been diagnosed with lung cancer.  CT February 2023 showed widespread pulmonary nodules increasing in size and emphysema.  Admitted November 2023 with acute respiratory failure felt secondary to CHF.  She was diuresed with improvement and also had thoracentesis.  Echocardiogram November 2023 showed ejection fraction 40 to 45%, grade 2 diastolic dysfunction, moderate left atrial enlargement, mild right atrial enlargement, left pleural effusion, severe mitral regurgitation, trace aortic insufficiency; mitral regurgitation is worse compared to previous.  There was discussion with Dr. Excell Seltzer concerning potential MitraClip.  There was also note that Tagrisso could be contributing to CHF.  She elected to discontinue Tagrisso to see if her symptoms would improve.  CT March 2024 showed enlargement of numerous scattered solid and subsolid nodules in both lungs suspicious for low-grade adenocarcinoma. Venous Dopplers December 2023 showed no DVT.  Since last seen, patient has mild dyspnea on exertion.  Her pedal edema is reasonly well-controlled with Lasix.  She denies chest pain, palpitations or syncope.  Current Outpatient Medications  Medication Sig Dispense Refill   Abatacept (ORENCIA IV) Inject into the vein every 28 (twenty-eight) days.     Acetaminophen (TYLENOL EXTRA STRENGTH PO) Take 1-2 tablets by mouth every 6 (six) hours as needed (pain).      Biotin 1000 MCG tablet Take 1,000 mcg by mouth daily.     Calcium Carbonate-Vitamin D 500-125 MG-UNIT TABS Take 1 tablet by mouth daily.     carvedilol (COREG) 3.125 MG tablet Take 1 tablet  (3.125 mg total) by mouth 2 (two) times daily with a meal. 180 tablet 3   denosumab (PROLIA) 60 MG/ML SOSY injection Inject 60 mg into the skin every 6 (six) months.     estradiol (ESTRACE VAGINAL) 0.1 MG/GM vaginal cream Place one gram vaginally up to three times a week as needed to maintain comfort 42.5 g 12   furosemide (LASIX) 40 MG tablet Take 1 tablet (40 mg total) by mouth daily. 30 tablet 3   gabapentin (NEURONTIN) 300 MG capsule TAKE 1 CAPSULE BY MOUTH IN THE MORNING, 1 IN THE AFTERNOON, AND 2 AT BEDTIME 360 capsule 1   Glucosamine-Chondroit-Vit C-Mn (GLUCOSAMINE CHONDR 1500 COMPLX PO) Take 1 capsule by mouth daily.     HYDROcodone-acetaminophen (NORCO/VICODIN) 5-325 MG tablet Take 1 tablet by mouth 2 (two) times daily as needed for moderate pain. 30 tablet 0   levothyroxine (SYNTHROID) 75 MCG tablet TAKE 1 TABLET BY MOUTH DAILY BEFORE BREAKFAST. 90 tablet 1   methocarbamol (ROBAXIN) 500 MG tablet TAKE 1 TABLET BY MOUTH EVERY 6 HOURS AS NEEDED FOR MUSCLE SPASMS. 60 tablet 1   Multiple Vitamin (MULTIVITAMIN) tablet Take 1 tablet by mouth daily.     NON FORMULARY Diltiazem 2%/Lidocaine5% compound Use 3 x rectally daily for 2 months to heal anal fissure 30 g 1   omeprazole (PRILOSEC) 20 MG capsule TAKE 1 CAPSULE BY MOUTH EVERY DAY 90 capsule 3   Probiotic Product (PROBIOTIC PO) Take 1 capsule by mouth daily.      sulfamethoxazole-trimethoprim (BACTRIM DS) 800-160 MG tablet Take 1 tablet by mouth 2 (two) times daily. 10 tablet 0   traMADol (ULTRAM) 50 MG tablet TAKE  1/2-1 TABLET BY MOUTH EVERY 8 HOURS AS NEEDED. DO NOT COMBINE WITH OTHER PAIN MEDICATION 60 tablet 0   VITAMIN D PO Take by mouth daily.     No current facility-administered medications for this visit.     Past Medical History:  Diagnosis Date   Arthralgia of multiple joints    followed by dr Sharmon Revere   Arthritis    Cardiomyopathy (HCC)    Chronic constipation    Chronic inflammatory arthritis    rhemotolgist-  dr a.  Sharmon Revere ([facility])   Dry eyes    eye drops used    GERD (gastroesophageal reflux disease)    H/O discoid lupus erythematosus    Hiatal hernia    History of colon polyps    Hypothyroidism    Iron deficiency anemia    LBBB (left bundle branch block) 2010   Mitchell's disease (erythromelalgia) [facility])    neurologist-  dr patel   Nocturia    Non-small cell cancer of right lung [facility]) surgeon-- dr gerhardt/  oncologist-  dr Arbutus Ped--- per lov notes no recurrence/   11-18-2017 per pt denies any symptoms   dx 2015--  Stage IIA (T2b,N0,M0) , +EGFR  mutation in exon 21, non-small cell adenocarcinoma right upper lobe---  s/p  Right upper lobectomy , right middley wedge resection and node dissection---  no chemo or radiation therapy   OA (osteoarthritis)    hands   Osteoporosis    PONV (postoperative nausea and vomiting)    likes phenergan   Raynaud's phenomenon 1965   Renal insufficiency    Rheumatoid arthritis (HCC)    Sciatica    Scoliosis    Sjogren's syndrome [facility])     Past Surgical History:  Procedure Laterality Date   ANTERIOR HIP REVISION Right 11/27/2017   Procedure: RIGHT HIP ACETABULAR REVISION;  Surgeon: Kathryne Hitch, MD;  Location: [facility] ORS;  Service: Orthopedics;  Laterality: Right;   ANTERIOR HIP REVISION Right 01/24/2018   Procedure: OPEN REDUCTION OF DISLOCATED ANTERIOR HIP WITH REVISION OF LINER AND HIP BALL;  Surgeon: Kathryne Hitch, MD;  Location: [facility];  Service: Orthopedics;  Laterality: Right;   APPENDECTOMY  1950s   BIOPSY  04/14/2018   Procedure: BIOPSY;  Surgeon: Benancio Deeds, MD;  Location: [facility] ENDOSCOPY;  Service: Gastroenterology;;   BIOPSY  04/16/2018   Procedure: BIOPSY;  Surgeon: Lemar Lofty., MD;  Location: [facility] ENDOSCOPY;  Service: Gastroenterology;;   CARDIOVASCULAR STRESS TEST  12/2008    mild fixed basal to mid septal perfusion defect felt likely due to artifact from LBBB, no ischemia, EF 58%   COLONOSCOPY      COLONOSCOPY WITH PROPOFOL N/A 04/16/2018   Procedure: COLONOSCOPY WITH PROPOFOL;  Surgeon: Lemar Lofty., MD;  Location: [facility] ENDOSCOPY;  Service: Gastroenterology;  Laterality: N/A;   ESOPHAGOGASTRODUODENOSCOPY (EGD) WITH PROPOFOL N/A 04/14/2018   Procedure: ESOPHAGOGASTRODUODENOSCOPY (EGD) WITH PROPOFOL;  Surgeon: Benancio Deeds, MD;  Location: [facility] ENDOSCOPY;  Service: Gastroenterology;  Laterality: N/A;   FEMORAL-POPLITEAL BYPASS GRAFT Right 04/10/2018   Procedure: REPAIR RIGHT FEMORAL ARTERY PSEUDOANEURYSM, RETROPERITONEAL EXPOSURE OF ILIAC ARTERY, RIGHT POPLITEAL EMBOLECTOMY;  Surgeon: Chuck Hint, MD;  Location: [facility] OR;  Service: Vascular;  Laterality: Right;   FLEXIBLE SIGMOIDOSCOPY N/A 06/17/2019   Procedure: FLEXIBLE SIGMOIDOSCOPY;  Surgeon: Shellia Cleverly, DO;  Location: [facility] ENDOSCOPY;  Service: Gastroenterology;  Laterality: N/A;   HEMOSTASIS CLIP PLACEMENT  06/17/2019   Procedure: HEMOSTASIS CLIP PLACEMENT;  Surgeon: Shellia Cleverly, DO;  Location:  [facility] ENDOSCOPY;  Service: Gastroenterology;;   LYMPH NODE DISSECTION Right 06/07/2013   Procedure: LYMPH NODE DISSECTION;  Surgeon: Delight Ovens, MD;  Location: [facility] OR;  Service: Thoracic;  Laterality: Right;   PATCH ANGIOPLASTY Right 04/10/2018   Procedure: PATCH  ANGIOPLASTY OF RIGHT FEMORAL ARTERY USING BOVINE PATCH, PATCH ANGIOPLASTY OF RIGHT POPLITEAL ARTERY USING BOVINE PATCH;  Surgeon: Chuck Hint, MD;  Location: [facility] OR;  Service: Vascular;  Laterality: Right;   SCHLEROTHERAPY  06/17/2019   Procedure: Theresia Majors OF VARICES;  Surgeon: Shellia Cleverly, DO;  Location: [facility] ENDOSCOPY;  Service: Gastroenterology;;   THORACIC SYMPATHETECTOMY  1965   "large incision from chest to up to shoulder, the nerves were tied together, for raynaud's   THORACOTOMY  06/07/2013   Procedure: MINI/LIMITED THORACOTOMY; right middle lobe wedge resection;  Surgeon: Delight Ovens, MD;  Location: [facility] OR;  Service:  Thoracic;;   TONSILLECTOMY  child   TOTAL ABDOMINAL HYSTERECTOMY  1980's    W/ BSO   TOTAL HIP ARTHROPLASTY Right 04/28/2014   Procedure: RIGHT TOTAL HIP ARTHROPLASTY ANTERIOR APPROACH;  Surgeon: Kathryne Hitch, MD;  Location: [facility]S;  Service: Orthopedics;  Laterality: Right;   TRANSTHORACIC ECHOCARDIOGRAM  12/11/2008   ef 45-50%, grade 1 diastolic dysfunction/  mild LAE/  mild AR and MR/  trivial TR   VIDEO ASSISTED THORACOSCOPY (VATS)/WEDGE RESECTION Right 06/07/2013   Procedure: VIDEO ASSISTED THORACOSCOPY (VATS)/right upper lobectomy, On Q;  Surgeon: Delight Ovens, MD;  Location: [facility] OR;  Service: Thoracic;  Laterality: Right;   VIDEO BRONCHOSCOPY N/A 06/07/2013   Procedure: VIDEO BRONCHOSCOPY;  Surgeon: Delight Ovens, MD;  [facility] OR;  Service: Thoracic;  Laterality: N/A;   VIDEO BRONCHOSCOPY WITH ENDOBRONCHIAL NAVIGATION N/A 05/04/2013   Procedure: VIDEO BRONCHOSCOPY WITH ENDOBRONCHIAL NAVIGATION;  Surgeon: Delight Ovens, MD;  Location: [facility] OR;  Service: Thoracic;  Laterality: N/A;    Social History   Socioeconomic History   Marital status: Widowed    Spouse name: Not on file   Number of children: 2   Years of education: Not on file   Highest education level: Not on file  Occupational History   Occupation: n/a  Tobacco Use   Smoking status: Never    Passive exposure: Past   Smokeless tobacco: Never  Vaping Use   Vaping Use: Never used  Substance and Sexual Activity   Alcohol use: Not Currently   Drug use: Never   Sexual activity: Not Currently    Birth control/protection: Surgical  Other Topics Concern   Not on file  Social History Narrative   Lives with husband, daughter and grandchild local.   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   Social Determinants of Health   Financial Resource Strain: Low Risk  (05/07/2021)   Overall Financial Resource Strain (CARDIA)    Difficulty of Paying Living Expenses: Not hard at  all  Food Insecurity: No Food Insecurity (05/07/2021)   Hunger Vital Sign    Worried About Running Out of Food in the Last Year: Never true    Ran Out of Food in the Last Year: Never true  Transportation Needs: No Transportation Needs (05/07/2021)   PRAPARE - Administrator, Civil Service (Medical): No    Lack of Transportation (Non-Medical): No  Physical Activity: Insufficiently Active (05/07/2021)   Exercise Vital Sign    Days of Exercise per Week: 3 days    Minutes of Exercise per Session: 30 min  Stress: No Stress Concern Present (05/07/2021)   [employer] of Occupational Health - Occupational Stress Questionnaire    Feeling of Stress : Not at all  Social Connections: Moderately Integrated (05/07/2021)   Social Connection and Isolation Panel [NHANES]    Frequency of Communication with Friends and Family: More than three times a week    Frequency of Social Gatherings with Friends and Family: More than three times a week    Attends Religious Services: More than 4 times per year    Active Member of [employer] or Organizations: Yes    Attends Banker Meetings: 1 to 4 times per year    Marital Status: Widowed  Intimate Partner Violence: Not At Risk (05/07/2021)   Humiliation, Afraid, Rape, and Kick questionnaire    Fear of Current or Ex-Partner: No    Emotionally Abused: No    Physically Abused: No    Sexually Abused: No    Family History  Problem Relation Age of Onset   Coronary artery disease Father    Colon cancer Father    Diabetes Father    Cancer Father        colon   Other Mother 16       MVA   Healthy Sister    Healthy Brother    Healthy Daughter    Hypothyroidism Daughter    Other Brother        killed in war   Pneumonia Sister    Healthy Daughter    Esophageal cancer Neg Hx    Kidney disease Neg Hx    Liver disease Neg Hx     ROS: Diarrhea but no fevers or chills, productive cough, hemoptysis, dysphasia, odynophagia, melena,  hematochezia, dysuria, hematuria, rash, seizure activity, orthopnea, PND, claudication. Remaining systems are negative.  Physical Exam: Well-developed well-nourished in no acute distress.  Skin is warm and dry.  HEENT is normal.  Neck is supple.  Chest is clear to auscultation with normal expansion.  Cardiovascular exam is regular rate and rhythm.  Abdominal exam nontender or distended. No masses palpated. Extremities show trace edema. neuro grossly intact  A/P  1 cardiomyopathy-this is felt possibly secondary to dyssynchrony.  Given her ongoing lung cancer and age we have elected to treat conservatively.  Will continue beta-blocker for now.  2 chronic combined systolic/diastolic congestive heart failure-as outlined in previous notes there was discussion during previous admission that patient may be a candidate for MitraClip but also Tagrisso could be contributing to her volume excess.  We have elected to be conservative at this point particularly given her age and ongoing lung cancer. Will continue Lasix at present dose.    3 palpitations-continue beta-blocker.  4 mitral regurgitation-plan as outlined under #2.  5 Left bundle branch block  6 lung cancer-Per oncology.  Apparently there are no other options for treatment per patient  Olga Millers, MD

## 2022-06-16 ENCOUNTER — Other Ambulatory Visit: Payer: Self-pay

## 2022-06-16 ENCOUNTER — Telehealth: Payer: Self-pay | Admitting: Physician Assistant

## 2022-06-16 DIAGNOSIS — A0472 Enterocolitis due to Clostridium difficile, not specified as recurrent: Secondary | ICD-10-CM

## 2022-06-16 NOTE — Telephone Encounter (Signed)
Spoke with pt and she is aware of Dr. Lauro Franklin recommendations. She will have her daughter pick up the kit from the lab.

## 2022-06-16 NOTE — Telephone Encounter (Signed)
PT is experiencing c diff symptoms and needs options for relief. Please advise.

## 2022-06-16 NOTE — Telephone Encounter (Signed)
Yes, retest, c diff pcr

## 2022-06-16 NOTE — Telephone Encounter (Signed)
Pt had c diff and was prescribed vancomycin 125mg  QID for 10 days. Reports when she first finished the med she was a lot better. States now the urgency is back and it is a little harder to make it to the restroom, when she first goes to the bathroom it is liquid diarrhea then it seems to firm up a little more. She wants to know if she needs to come and give another stool sample of if she just needs a refill on the medication. Please advise.

## 2022-06-17 ENCOUNTER — Other Ambulatory Visit: Payer: Medicare PPO

## 2022-06-17 DIAGNOSIS — A0472 Enterocolitis due to Clostridium difficile, not specified as recurrent: Secondary | ICD-10-CM | POA: Diagnosis not present

## 2022-06-18 ENCOUNTER — Encounter: Payer: Self-pay | Admitting: Cardiology

## 2022-06-18 ENCOUNTER — Ambulatory Visit: Payer: Medicare PPO | Attending: Cardiology | Admitting: Cardiology

## 2022-06-18 VITALS — BP 92/50 | HR 90 | Ht 60.0 in | Wt 104.1 lb

## 2022-06-18 DIAGNOSIS — I340 Nonrheumatic mitral (valve) insufficiency: Secondary | ICD-10-CM | POA: Diagnosis not present

## 2022-06-18 DIAGNOSIS — I5040 Unspecified combined systolic (congestive) and diastolic (congestive) heart failure: Secondary | ICD-10-CM

## 2022-06-18 DIAGNOSIS — I420 Dilated cardiomyopathy: Secondary | ICD-10-CM

## 2022-06-18 NOTE — Patient Instructions (Signed)
    Follow-Up: At [facility], you and your health needs are our priority.  As part of our continuing mission to provide you with exceptional heart care, we have created designated Provider Care Teams.  These Care Teams include your primary Cardiologist (physician) and Advanced Practice Providers (APPs -  Physician Assistants and Nurse Practitioners) who all work together to provide you with the care you need, when you need it.  We recommend signing up for the patient portal called "MyChart".  Sign up information is provided on this After Visit Summary.  MyChart is used to connect with patients for Virtual Visits (Telemedicine).  Patients are able to view lab/test results, encounter notes, upcoming appointments, etc.  Non-urgent messages can be sent to your provider as well.   To learn more about what you can do with MyChart, go to [email or web address].    Your next appointment:   6 month(s)  Provider:   Brian Crenshaw, MD      

## 2022-06-19 ENCOUNTER — Other Ambulatory Visit: Payer: Self-pay

## 2022-06-19 ENCOUNTER — Telehealth: Payer: Self-pay

## 2022-06-19 LAB — CLOSTRIDIUM DIFFICILE BY PCR: Toxigenic C. Difficile by PCR: POSITIVE — AB

## 2022-06-19 MED ORDER — FIDAXOMICIN 200 MG PO TABS: 200.0000 mg | ORAL_TABLET | Freq: Two times a day (BID) | ORAL | 0 refills | Status: DC

## 2022-06-19 MED ORDER — VANCOMYCIN HCL 125 MG PO CAPS: 125.0000 mg | ORAL_CAPSULE | Freq: Four times a day (QID) | ORAL | 0 refills | Status: DC

## 2022-06-19 NOTE — Telephone Encounter (Signed)
Pyrtle, Carie Caddy, MD  Chrystie Nose, RN Cc: Shellia Cleverly, DO VC patient Recurrent c diff Can we get her Dificid 200 mg BID x 10 days Needs ASAP If needs 1-2 days of oral vanc 125 mg QID to bridge time to Dificid that is okay Thanks JMP  [facility] prescriptions have been sent in for both vanc and difficid. They both may need prior auth. If the difficid can be approve asap she would not need the vanc.

## 2022-06-23 NOTE — Telephone Encounter (Signed)
Did her dificid get approved?

## 2022-06-25 ENCOUNTER — Telehealth: Payer: Self-pay | Admitting: Cardiology

## 2022-06-25 NOTE — Telephone Encounter (Signed)
*  STAT* If patient is at the pharmacy, call can be transferred to refill team.   1. Which medications need to be refilled? (please list name of each medication and dose if known) furosemide (LASIX) 40 MG tablet   2. Which pharmacy/location (including street and city if local pharmacy) is medication to be sent to?  [id number] - [place], [place] - [address]    3. Do they need a 30 day or 90 day supply? 30 day   Pt is completely out of medication.

## 2022-06-26 MED ORDER — FUROSEMIDE 40 MG PO TABS: 40.0000 mg | ORAL_TABLET | Freq: Every day | ORAL | 6 refills | Status: DC

## 2022-06-26 NOTE — Telephone Encounter (Signed)
Her pedal edema is reasonly well-controlled with Lasix. Will continue Lasix at present dose.

## 2022-06-30 ENCOUNTER — Other Ambulatory Visit: Payer: Medicare PPO

## 2022-06-30 DIAGNOSIS — R300 Dysuria: Secondary | ICD-10-CM | POA: Diagnosis not present

## 2022-06-30 MED ORDER — SULFAMETHOXAZOLE-TRIMETHOPRIM 400-80 MG PO TABS
1.0000 | ORAL_TABLET | Freq: Two times a day (BID) | ORAL | 0 refills | Status: DC
Start: 2022-06-30 — End: 2022-07-10

## 2022-06-30 MED ORDER — SULFAMETHOXAZOLE-TRIMETHOPRIM 800-160 MG PO TABS: 1.0000 | ORAL_TABLET | Freq: Two times a day (BID) | ORAL | 0 refills | Status: DC

## 2022-06-30 NOTE — Telephone Encounter (Signed)
Adjusted her dose of septra for renal function Please see the MyChart message reply(ies) for my assessment and plan.  The patient gave consent for this Medical Advice Message and is aware that it may result in a bill to their insurance company as well as the possibility that this may result in a co-payment or deductible. They are an established patient, but are not seeking medical advice exclusively about a problem treated during an in person or video visit in the last 7 days. I did not recommend an in person or video visit within 7 days of my reply.  I spent a total of 12 minutes cumulative time within 7 days through MyChart messaging Abbe Amsterdam, MD

## 2022-06-30 NOTE — Addendum Note (Signed)
Addended by: Abbe Amsterdam C on: 06/30/2022 12:13 PM   Modules accepted: Orders

## 2022-07-01 ENCOUNTER — Other Ambulatory Visit: Payer: Self-pay | Admitting: Internal Medicine

## 2022-07-01 DIAGNOSIS — M25512 Pain in left shoulder: Secondary | ICD-10-CM

## 2022-07-01 LAB — URINE CULTURE
MICRO NUMBER:: 14947851
Result:: NO GROWTH
SPECIMEN QUALITY:: ADEQUATE

## 2022-07-02 ENCOUNTER — Encounter: Payer: Self-pay | Admitting: Family Medicine

## 2022-07-03 ENCOUNTER — Other Ambulatory Visit (HOSPITAL_COMMUNITY): Payer: Self-pay

## 2022-07-03 NOTE — Telephone Encounter (Signed)
Just providing follow up. PA not required for Dificid or Vancomycin. Test billing with [facility] returns a copay of $100 for Dificid and $10 Vancomycin

## 2022-07-09 ENCOUNTER — Encounter (HOSPITAL_COMMUNITY): Payer: Medicare PPO

## 2022-07-10 ENCOUNTER — Telehealth: Payer: Self-pay | Admitting: Family Medicine

## 2022-07-10 ENCOUNTER — Encounter: Payer: Self-pay | Admitting: Family Medicine

## 2022-07-10 NOTE — Telephone Encounter (Signed)
Copied from CRM [id number]. Topic: Medicare AWV >> Jul 10, 2022  9:30 AM Payton Doughty wrote: Reason for CRM: Called patient to schedule Medicare Annual Wellness Visit (AWV). Left message for patient to call back and schedule Medicare Annual Wellness Visit (AWV).  Last date of AWV: 05/07/2021  Please schedule an appointment at any time with Donne Anon, CMA  .  If any questions, please contact me.  Thank you ,  Verlee Rossetti; Care Guide Ambulatory Clinical Support [facility] Direct Dial: [phone]

## 2022-07-10 NOTE — Telephone Encounter (Signed)
Called pt as I was concerned. She had C diff just recently and she sounds tired/ weak.  Her daughter is on her way to her home- I advised her to have her daughter take her to the ER and she states agreement

## 2022-07-11 ENCOUNTER — Other Ambulatory Visit: Payer: Self-pay

## 2022-07-11 ENCOUNTER — Other Ambulatory Visit: Payer: Medicare PPO

## 2022-07-11 ENCOUNTER — Telehealth: Payer: Self-pay | Admitting: Physician Assistant

## 2022-07-11 DIAGNOSIS — A0472 Enterocolitis due to Clostridium difficile, not specified as recurrent: Secondary | ICD-10-CM

## 2022-07-11 NOTE — Telephone Encounter (Signed)
Spoke with pt and she is aware that order is in epic.

## 2022-07-11 NOTE — Telephone Encounter (Signed)
PT is having a c diff flare and wants to be able to have an order put in a order for a stool sample kit. Please advise.

## 2022-07-11 NOTE — Telephone Encounter (Signed)
Pts daughter came to pick up the stool kit. She is wanting to know if vancomycin can  be sent in for her to start taking. Afraid she may wind up having to take her to the ER if she doesn't start something. Please advise.

## 2022-07-11 NOTE — Telephone Encounter (Signed)
See note below and advise. 

## 2022-07-11 NOTE — Addendum Note (Signed)
Addended by: Quentin Mulling on: 07/11/2022 03:59 PM   Modules accepted: Orders

## 2022-07-18 NOTE — Telephone Encounter (Signed)
Called pt and she has not returned the specimen. States she plans to return it next week.

## 2022-07-21 ENCOUNTER — Ambulatory Visit (HOSPITAL_COMMUNITY)
Admission: RE | Admit: 2022-07-21 | Discharge: 2022-07-21 | Disposition: A | Discharge status: Home / Self Care | Payer: Medicare PPO | Source: Ambulatory Visit | Attending: Internal Medicine | Admitting: Internal Medicine

## 2022-07-21 ENCOUNTER — Other Ambulatory Visit: Payer: Self-pay

## 2022-07-21 ENCOUNTER — Inpatient Hospital Stay: Payer: Medicare PPO | Attending: Nurse Practitioner

## 2022-07-21 ENCOUNTER — Other Ambulatory Visit: Payer: Medicare PPO

## 2022-07-21 ENCOUNTER — Encounter (HOSPITAL_COMMUNITY): Payer: Self-pay

## 2022-07-21 DIAGNOSIS — C3490 Malignant neoplasm of unspecified part of unspecified bronchus or lung: Secondary | ICD-10-CM | POA: Diagnosis not present

## 2022-07-21 DIAGNOSIS — Z08 Encounter for follow-up examination after completed treatment for malignant neoplasm: Secondary | ICD-10-CM | POA: Insufficient documentation

## 2022-07-21 DIAGNOSIS — A0472 Enterocolitis due to Clostridium difficile, not specified as recurrent: Secondary | ICD-10-CM

## 2022-07-21 DIAGNOSIS — Z85118 Personal history of other malignant neoplasm of bronchus and lung: Secondary | ICD-10-CM | POA: Insufficient documentation

## 2022-07-21 DIAGNOSIS — C3411 Malignant neoplasm of upper lobe, right bronchus or lung: Secondary | ICD-10-CM

## 2022-07-21 DIAGNOSIS — R918 Other nonspecific abnormal finding of lung field: Secondary | ICD-10-CM | POA: Diagnosis not present

## 2022-07-21 LAB — COMPREHENSIVE METABOLIC PANEL
ALT: 9 U/L (ref 0–44)
AST: 18 U/L (ref 15–41)
Albumin: 3.7 g/dL (ref 3.5–5.0)
Alkaline Phosphatase: 53 U/L (ref 38–126)
Anion gap: 8 (ref 5–15)
BUN: 22 mg/dL (ref 8–23)
CO2: 27 mmol/L (ref 22–32)
Calcium: 8.7 mg/dL — ABNORMAL LOW (ref 8.9–10.3)
Chloride: 96 mmol/L — ABNORMAL LOW (ref 98–111)
Creatinine, Ser: 1.11 mg/dL — ABNORMAL HIGH (ref 0.44–1.00)
GFR, Estimated: 48 mL/min — ABNORMAL LOW (ref >60)
Glucose, Bld: 107 mg/dL — ABNORMAL HIGH (ref 70–99)
Potassium: 4.6 mmol/L (ref 3.5–5.1)
Sodium: 131 mmol/L — ABNORMAL LOW (ref 135–145)
Total Bilirubin: 0.3 mg/dL (ref 0.3–1.2)
Total Protein: 6.7 g/dL (ref 6.5–8.1)

## 2022-07-21 LAB — CBC WITH DIFFERENTIAL/PLATELET
Abs Immature Granulocytes: 0.07 10*3/uL (ref 0.00–0.07)
Basophils Absolute: 0.1 10*3/uL (ref 0.0–0.1)
Basophils Relative: 1 %
Eosinophils Absolute: 0.1 10*3/uL (ref 0.0–0.5)
Eosinophils Relative: 1 %
HCT: 31.4 % — ABNORMAL LOW (ref 36.0–46.0)
Hemoglobin: 10.2 g/dL — ABNORMAL LOW (ref 12.0–15.0)
Immature Granulocytes: 1 %
Lymphocytes Relative: 14 %
Lymphs Abs: 1.4 10*3/uL (ref 0.7–4.0)
MCH: 29.4 pg (ref 26.0–34.0)
MCHC: 32.5 g/dL (ref 30.0–36.0)
MCV: 90.5 fL (ref 80.0–100.0)
Monocytes Absolute: 0.7 10*3/uL (ref 0.1–1.0)
Monocytes Relative: 7 %
Neutro Abs: 8.2 10*3/uL — ABNORMAL HIGH (ref 1.7–7.7)
Neutrophils Relative %: 76 %
Platelets: 301 10*3/uL (ref 150–400)
RBC: 3.47 MIL/uL — ABNORMAL LOW (ref 3.87–5.11)
RDW: 14.5 % (ref 11.5–15.5)
WBC: 10.5 10*3/uL (ref 4.0–10.5)
nRBC: 0 % (ref 0.0–0.2)

## 2022-07-21 MED ORDER — IOHEXOL 300 MG/ML  SOLN
75.0000 mL | Freq: Once | INTRAMUSCULAR | Status: AC | PRN
  Administered 2022-07-21: 75 mL via INTRAVENOUS

## 2022-07-21 MED ORDER — SODIUM CHLORIDE (PF) 0.9 % IJ SOLN
INTRAMUSCULAR | Status: AC
  Filled 2022-07-21: qty 50

## 2022-07-22 LAB — C. DIFFICILE GDH AND TOXIN A/B
GDH ANTIGEN: DETECTED
MICRO NUMBER:: 15033027
SPECIMEN QUALITY:: ADEQUATE
TOXIN A AND B: DETECTED

## 2022-07-22 NOTE — Progress Notes (Signed)
+   Recurrent Cdiff, will treat with dificin but will also refer to ID since high risk of reoccurence.

## 2022-07-23 ENCOUNTER — Other Ambulatory Visit: Payer: Self-pay

## 2022-07-23 ENCOUNTER — Encounter: Payer: Self-pay | Admitting: Gastroenterology

## 2022-07-23 ENCOUNTER — Ambulatory Visit: Payer: Medicare PPO | Admitting: Gastroenterology

## 2022-07-23 VITALS — BP 128/68 | HR 86 | Ht 60.0 in | Wt 108.0 lb

## 2022-07-23 DIAGNOSIS — R197 Diarrhea, unspecified: Secondary | ICD-10-CM | POA: Diagnosis not present

## 2022-07-23 DIAGNOSIS — A0472 Enterocolitis due to Clostridium difficile, not specified as recurrent: Secondary | ICD-10-CM | POA: Diagnosis not present

## 2022-07-23 MED ORDER — DIFICID 200 MG PO TABS: 200.0000 mg | ORAL_TABLET | Freq: Two times a day (BID) | ORAL | 0 refills | Status: DC

## 2022-07-23 NOTE — Progress Notes (Signed)
Chief Complaint:    Diarrhea, C. difficile  GI History: 89 y.o. female with a past medical history of GERD, Sjogren's syndrome, RA, Raynaud's, chronic constipation, discoid lupus, LBBB, Mitchell's disease, non-small cell cancer of right lung in 2015, OA, GERD, constipation, hemorrhoids status post banding with subsequent rectal ulcer   Colonoscopy (04/2018, Dr. Meridee Score): Grade 2 internal hemorrhoids, 3 small tubular adenomas EGD (03/2018, Dr. Adela Lank): 7 cm HH, tortuous esophagus, localized nodularity at GJ (biopsy: H. pylori gastritis-treated as inpatient), Normal stomach/duodenum -03/21/2019: Successful banding of the RA hemorrhoid -04/26/2019: Successful banding of the LL hemorrhoid -05/31/2019: Successful banding of the RP hemorrhoid 06/07/2019 flex sigmoidoscopy during hospitalization for onset of painless hematochezia showed a single/solitary ulcer in the distal rectum successfully treated with hemostatic clips and epinephrine injection.  Hemoglobin 6.3 transfuse 2 PRBCs discharge 10.9  History of C. difficile.  Developed diarrhea in 04/2022, good response to initial course of vancomycin, but recurrence of symptoms in April (positive PCR), treated with Dificid with resolution of sxs.  HPI:     Patient is a 89 y.o. female presenting to the Gastroenterology Clinic for follow-up.  Was diagnosed with C. difficile diarrhea (treated with vancomycin) along with rectal fissure, treated with diltiazem/lidocaine.  Also with small prolapsing internal hemorrhoids, recommending conservative management.  She had a good response to initial course of vancomycin, but recurrence of symptoms in April (positive PCR), treated with Dificid with resolution of sxs.  Symptoms started to recur in late May (about 10 days ago).  Repeat C. difficile testing was positive for toxin A/B.  Prescription was sent in by [facility] for Dificid and Florastor, but not yet picked up.  Also send a referral to ID for recurrent C.  difficile.  Otherwise recent CBC and CMP at baseline.      Latest Ref Rng & Units 07/21/2022   10:12 AM 05/14/2022   10:50 AM 04/22/2022   12:59 PM  CBC  WBC 4.0 - 10.5 K/uL 10.5  8.9  11.8   Hemoglobin 12.0 - 15.0 g/dL 16.1  09.6  04.5   Hematocrit 36.0 - 46.0 % 31.4  33.7  35.0   Platelets 150 - 400 K/uL 301  316  205       Latest Ref Rng & Units 07/21/2022   10:12 AM 05/14/2022   10:50 AM 04/22/2022   12:59 PM  CMP  Glucose 70 - 99 mg/dL 409  811  914   BUN 8 - 23 mg/dL 22  20  34   Creatinine 0.44 - 1.00 mg/dL 7.82  9.56  2.13   Sodium 135 - 145 mmol/L 131  131  129   Potassium 3.5 - 5.1 mmol/L 4.6  4.3  4.9   Chloride 98 - 111 mmol/L 96  95  96   CO2 22 - 32 mmol/L 27  26  27    Calcium 8.9 - 10.3 mg/dL 8.7  8.8  8.9   Total Protein 6.5 - 8.1 g/dL 6.7  6.2  7.4   Total Bilirubin 0.3 - 1.2 mg/dL 0.3  0.5  0.2   Alkaline Phos 38 - 126 U/L 53  58  51   AST 15 - 41 U/L 18  26  25    ALT 0 - 44 U/L 9  15  17         Review of systems:     No chest pain, no SOB, no fevers, no urinary sx   Past Medical History:  Diagnosis Date   Arthralgia of  multiple joints    followed by dr Sharmon Revere   Arthritis    Cardiomyopathy (HCC)    Chronic constipation    Chronic inflammatory arthritis    rhemotolgist-  dr a. Sharmon Revere ([facility])   Dry eyes    eye drops used    GERD (gastroesophageal reflux disease)    H/O discoid lupus erythematosus    Hiatal hernia    History of colon polyps    Hypothyroidism    Iron deficiency anemia    LBBB (left bundle branch block) 2010   Mitchell's disease (erythromelalgia) [facility])    neurologist-  dr patel   Nocturia    Non-small cell cancer of right lung [facility]) surgeon-- dr gerhardt/  oncologist-  dr Arbutus Ped--- per lov notes no recurrence/   11-18-2017 per pt denies any symptoms   dx 2015--  Stage IIA (T2b,N0,M0) , +EGFR  mutation in exon 21, non-small cell adenocarcinoma right upper lobe---  s/p  Right upper lobectomy , right middley wedge  resection and node dissection---  no chemo or radiation therapy   OA (osteoarthritis)    hands   Osteoporosis    PONV (postoperative nausea and vomiting)    likes phenergan   Raynaud's phenomenon 1965   Renal insufficiency    Rheumatoid arthritis (HCC)    Sciatica    Scoliosis    Sjogren's syndrome (HCC)     Patient's surgical history, family medical history, social history, medications and allergies were all reviewed in Epic    Current Outpatient Medications  Medication Sig Dispense Refill   Abatacept (ORENCIA IV) Inject into the vein every 28 (twenty-eight) days.     Acetaminophen (TYLENOL EXTRA STRENGTH PO) Take 1-2 tablets by mouth every 6 (six) hours as needed (pain).      Biotin 1000 MCG tablet Take 1,000 mcg by mouth daily.     Calcium Carbonate-Vitamin D 500-125 MG-UNIT TABS Take 1 tablet by mouth daily.     carvedilol (COREG) 3.125 MG tablet Take 1 tablet (3.125 mg total) by mouth 2 (two) times daily with a meal. 180 tablet 3   denosumab (PROLIA) 60 MG/ML SOSY injection Inject 60 mg into the skin every 6 (six) months.     estradiol (ESTRACE VAGINAL) 0.1 MG/GM vaginal cream Place one gram vaginally up to three times a week as needed to maintain comfort 42.5 g 12   fidaxomicin (DIFICID) 200 MG TABS tablet Take 1 tablet (200 mg total) by mouth 2 (two) times daily. 28 tablet 0   furosemide (LASIX) 40 MG tablet Take 1 tablet (40 mg total) by mouth daily. 30 tablet 6   gabapentin (NEURONTIN) 300 MG capsule TAKE 1 CAPSULE BY MOUTH IN THE MORNING, 1 IN THE AFTERNOON, AND 2 AT BEDTIME 360 capsule 1   Glucosamine-Chondroit-Vit C-Mn (GLUCOSAMINE CHONDR 1500 COMPLX PO) Take 1 capsule by mouth daily.     HYDROcodone-acetaminophen (NORCO/VICODIN) 5-325 MG tablet Take 1 tablet by mouth 2 (two) times daily as needed for moderate pain. 30 tablet 0   levothyroxine (SYNTHROID) 75 MCG tablet TAKE 1 TABLET BY MOUTH DAILY BEFORE BREAKFAST. 90 tablet 1   methocarbamol (ROBAXIN) 500 MG tablet TAKE 1  TABLET BY MOUTH EVERY 6 HOURS AS NEEDED FOR MUSCLE SPASMS. 60 tablet 1   Multiple Vitamin (MULTIVITAMIN) tablet Take 1 tablet by mouth daily.     NON FORMULARY Diltiazem 2%/Lidocaine5% compound Use 3 x rectally daily for 2 months to heal anal fissure 30 g 1   omeprazole (PRILOSEC) 20 MG capsule  TAKE 1 CAPSULE BY MOUTH EVERY DAY 90 capsule 3   Probiotic Product (PROBIOTIC PO) Take 1 capsule by mouth daily.      traMADol (ULTRAM) 50 MG tablet TAKE 1/2-1 TABLET BY MOUTH EVERY 8 HOURS AS NEEDED. DO NOT COMBINE WITH OTHER PAIN MEDICATION 60 tablet 0   VITAMIN D PO Take by mouth daily.     No current facility-administered medications for this visit.    Physical Exam:     Ht 5' (1.524 m)   Wt 108 lb (49 kg)   BMI 21.09 kg/m   GENERAL:  Pleasant female in NAD PSYCH: : Cooperative, normal affect NEURO: Alert and oriented x 3   IMPRESSION and PLAN:    1) C. difficile 2) Infectious diarrhea - Start Dificid as prescribed - Start Florastor as prescribed - Has a referral to the Infectious Disease clinic for evaluation/treatment of recurrent C. difficile - Discussed consideration of Vowst or Rebyotta pending patient evaluation in the ID clinic           Shellia Cleverly ,DO, FACG 07/23/2022, 1:29 PM

## 2022-07-23 NOTE — Patient Instructions (Signed)
  _______________________________________________________  If your blood pressure at your visit was 140/90 or greater, please contact your primary care physician to follow up on this.  _______________________________________________________  If you are age 87 or older, your body mass index should be between 23-30. Your Body mass index is 21.09 kg/m. If this is out of the aforementioned range listed, please consider follow up with your Primary Care Provider. __________________________________________________________  The [facility] providers would like to encourage you to use [facility] to communicate with providers for non-urgent requests or questions.  Due to long hold times on the telephone, sending your provider a message by [facility] may be a faster and more efficient way to get a response.  Please allow 48 business hours for a response.  Please remember that this is for non-urgent requests.  Due to recent changes in healthcare laws, you may see the results of your imaging and laboratory studies on MyChart before your provider has had a chance to review them.  We understand that in some cases there may be results that are confusing or concerning to you. Not all laboratory results come back in the same time frame and the provider may be waiting for multiple results in order to interpret others.  Please give [place] 48 hours in order for your provider to thoroughly review all the results before contacting the office for clarification of your results.    Thank you for choosing me and [facility].  Vito Cirigliano, D.O.

## 2022-07-24 ENCOUNTER — Other Ambulatory Visit: Payer: Self-pay

## 2022-07-24 ENCOUNTER — Inpatient Hospital Stay: Payer: Medicare PPO | Admitting: Internal Medicine

## 2022-07-24 VITALS — BP 125/67 | HR 87 | Temp 97.7°F | Resp 16 | Wt 107.4 lb

## 2022-07-24 DIAGNOSIS — Z08 Encounter for follow-up examination after completed treatment for malignant neoplasm: Secondary | ICD-10-CM | POA: Diagnosis not present

## 2022-07-24 DIAGNOSIS — C3490 Malignant neoplasm of unspecified part of unspecified bronchus or lung: Secondary | ICD-10-CM

## 2022-07-24 DIAGNOSIS — Z85118 Personal history of other malignant neoplasm of bronchus and lung: Secondary | ICD-10-CM | POA: Diagnosis not present

## 2022-07-24 NOTE — Progress Notes (Signed)
[facility] [phone]   [phone]  OFFICE PROGRESS NOTE  Ellis, Raven Found, MD [address] [place] [address]  DIAGNOSIS: Multifocal non-small cell lung cancer, adenocarcinoma initially diagnosed as stage IIA (T2b, N0, M0) non-small cell lung cancer, adenocarcinoma with positive EGFR mutation in exon 21 ([id number]) presented with right upper lobe lung mass diagnosed in March of 2015.  PRIOR THERAPY:  1) Status post right upper lobectomy with wedge resection of the right middle lobe under the care of Dr. Tyrone Sage on 06/07/2013 2) Tagrisso 80 mg p.o. daily started November 19, 2021.  Status post 4 weeks of treatment.  This was discontinued secondary to intolerance and questionable drug-induced pneumonitis.  CURRENT THERAPY: Observation.  INTERVAL HISTORY: [name] 89 y.o. female turns to the clinic today for follow-up visit accompanied by her daughter [name].  The patient is feeling fine today with no concerning complaints except for the baseline shortness of breath increased with exertion.  She also has swelling of the right lower extremity.  She denied having any current chest pain but has mild cough with no hemoptysis.  She has no nausea, vomiting, but had diarrhea and currently on treatment for C. difficile after prolonged antibiotic for urinary tract infection.  The patient denied having any recent weight loss or night sweats.  She is here today for evaluation with repeat CT scan of the chest for restaging of her disease.  MEDICAL HISTORY: Past Medical History:  Diagnosis Date   Arthralgia of multiple joints    followed by dr Sharmon Revere   Arthritis    Cardiomyopathy (HCC)    Chronic constipation    Chronic inflammatory arthritis    rhemotolgist-  dr a. Sharmon Revere ([facility])   Dry eyes    eye drops used    GERD (gastroesophageal reflux disease)    H/O discoid lupus erythematosus    Hiatal hernia    History of  colon polyps    Hypothyroidism    Iron deficiency anemia    LBBB (left bundle branch block) 2010   Mitchell's disease (erythromelalgia) [facility])    neurologist-  dr patel   Nocturia    Non-small cell cancer of right lung [facility]) surgeon-- dr gerhardt/  oncologist-  dr Arbutus Ped--- per lov notes no recurrence/   11-18-2017 per pt denies any symptoms   dx 2015--  Stage IIA (T2b,N0,M0) , +EGFR  mutation in exon 21, non-small cell adenocarcinoma right upper lobe---  s/p  Right upper lobectomy , right middley wedge resection and node dissection---  no chemo or radiation therapy   OA (osteoarthritis)    hands   Osteoporosis    PONV (postoperative nausea and vomiting)    likes phenergan   Raynaud's phenomenon 1965   Renal insufficiency    Rheumatoid arthritis (HCC)    Sciatica    Scoliosis    Sjogren's syndrome (HCC)     ALLERGIES:  is allergic to amlodipine, prochlorperazine edisylate, aspirin, cymbalta [duloxetine hcl], and pamelor [nortriptyline hcl].  MEDICATIONS:  Current Outpatient Medications  Medication Sig Dispense Refill   Abatacept (ORENCIA IV) Inject into the vein every 28 (twenty-eight) days.     Acetaminophen (TYLENOL EXTRA STRENGTH PO) Take 1-2 tablets by mouth every 6 (six) hours as needed (pain).      Biotin 1000 MCG tablet Take 1,000 mcg by mouth daily.     Calcium Carbonate-Vitamin D 500-125 MG-UNIT TABS Take 1 tablet by mouth daily.  carvedilol (COREG) 3.125 MG tablet Take 1 tablet (3.125 mg total) by mouth 2 (two) times daily with a meal. 180 tablet 3   denosumab (PROLIA) 60 MG/ML SOSY injection Inject 60 mg into the skin every 6 (six) months.     estradiol (ESTRACE VAGINAL) 0.1 MG/GM vaginal cream Place one gram vaginally up to three times a week as needed to maintain comfort 42.5 g 12   fidaxomicin (DIFICID) 200 MG TABS tablet Take 1 tablet (200 mg total) by mouth 2 (two) times daily. 28 tablet 0   furosemide (LASIX) 40 MG tablet Take 1 tablet (40 mg total) by mouth  daily. 30 tablet 6   gabapentin (NEURONTIN) 300 MG capsule TAKE 1 CAPSULE BY MOUTH IN THE MORNING, 1 IN THE AFTERNOON, AND 2 AT BEDTIME 360 capsule 1   Glucosamine-Chondroit-Vit C-Mn (GLUCOSAMINE CHONDR 1500 COMPLX PO) Take 1 capsule by mouth daily.     HYDROcodone-acetaminophen (NORCO/VICODIN) 5-325 MG tablet Take 1 tablet by mouth 2 (two) times daily as needed for moderate pain. 30 tablet 0   levothyroxine (SYNTHROID) 75 MCG tablet TAKE 1 TABLET BY MOUTH DAILY BEFORE BREAKFAST. 90 tablet 1   methocarbamol (ROBAXIN) 500 MG tablet TAKE 1 TABLET BY MOUTH EVERY 6 HOURS AS NEEDED FOR MUSCLE SPASMS. 60 tablet 1   Multiple Vitamin (MULTIVITAMIN) tablet Take 1 tablet by mouth daily.     NON FORMULARY Diltiazem 2%/Lidocaine5% compound Use 3 x rectally daily for 2 months to heal anal fissure 30 g 1   omeprazole (PRILOSEC) 20 MG capsule TAKE 1 CAPSULE BY MOUTH EVERY DAY 90 capsule 3   Probiotic Product (PROBIOTIC PO) Take 1 capsule by mouth daily.      traMADol (ULTRAM) 50 MG tablet TAKE 1/2-1 TABLET BY MOUTH EVERY 8 HOURS AS NEEDED. DO NOT COMBINE WITH OTHER PAIN MEDICATION 60 tablet 0   VITAMIN D PO Take by mouth daily.     No current facility-administered medications for this visit.    SURGICAL HISTORY:  Past Surgical History:  Procedure Laterality Date   ANTERIOR HIP REVISION Right 11/27/2017   Procedure: RIGHT HIP ACETABULAR REVISION;  Surgeon: Kathryne Hitch, MD;  Location: [facility] ORS;  Service: Orthopedics;  Laterality: Right;   ANTERIOR HIP REVISION Right 01/24/2018   Procedure: OPEN REDUCTION OF DISLOCATED ANTERIOR HIP WITH REVISION OF LINER AND HIP BALL;  Surgeon: Kathryne Hitch, MD;  Location: [facility];  Service: Orthopedics;  Laterality: Right;   APPENDECTOMY  1950s   BIOPSY  04/14/2018   Procedure: BIOPSY;  Surgeon: Benancio Deeds, MD;  Location: [facility] ENDOSCOPY;  Service: Gastroenterology;;   BIOPSY  04/16/2018   Procedure: BIOPSY;  Surgeon: Lemar Lofty., MD;   Location: [facility] ENDOSCOPY;  Service: Gastroenterology;;   CARDIOVASCULAR STRESS TEST  12/2008    mild fixed basal to mid septal perfusion defect felt likely due to artifact from LBBB, no ischemia, EF 58%   COLONOSCOPY     COLONOSCOPY WITH PROPOFOL N/A 04/16/2018   Procedure: COLONOSCOPY WITH PROPOFOL;  Surgeon: Lemar Lofty., MD;  Location: [facility] ENDOSCOPY;  Service: Gastroenterology;  Laterality: N/A;   ESOPHAGOGASTRODUODENOSCOPY (EGD) WITH PROPOFOL N/A 04/14/2018   Procedure: ESOPHAGOGASTRODUODENOSCOPY (EGD) WITH PROPOFOL;  Surgeon: Benancio Deeds, MD;  Location: [facility] ENDOSCOPY;  Service: Gastroenterology;  Laterality: N/A;   FEMORAL-POPLITEAL BYPASS GRAFT Right 04/10/2018   Procedure: REPAIR RIGHT FEMORAL ARTERY PSEUDOANEURYSM, RETROPERITONEAL EXPOSURE OF ILIAC ARTERY, RIGHT POPLITEAL EMBOLECTOMY;  Surgeon: Chuck Hint, MD;  Location: [facility] OR;  Service: Vascular;  Laterality:  Right;   FLEXIBLE SIGMOIDOSCOPY N/A 06/17/2019   Procedure: FLEXIBLE SIGMOIDOSCOPY;  Surgeon: Shellia Cleverly, DO;  Location: [facility] ENDOSCOPY;  Service: Gastroenterology;  Laterality: N/A;   HEMOSTASIS CLIP PLACEMENT  06/17/2019   Procedure: HEMOSTASIS CLIP PLACEMENT;  Surgeon: Shellia Cleverly, DO;  Location: [facility] ENDOSCOPY;  Service: Gastroenterology;;   LYMPH NODE DISSECTION Right 06/07/2013   Procedure: LYMPH NODE DISSECTION;  Surgeon: Delight Ovens, MD;  Location: [facility] OR;  Service: Thoracic;  Laterality: Right;   PATCH ANGIOPLASTY Right 04/10/2018   Procedure: PATCH  ANGIOPLASTY OF RIGHT FEMORAL ARTERY USING BOVINE PATCH, PATCH ANGIOPLASTY OF RIGHT POPLITEAL ARTERY USING BOVINE PATCH;  Surgeon: Chuck Hint, MD;  Location: [facility] OR;  Service: Vascular;  Laterality: Right;   SCHLEROTHERAPY  06/17/2019   Procedure: Theresia Majors OF VARICES;  Surgeon: Shellia Cleverly, DO;  Location: [facility] ENDOSCOPY;  Service: Gastroenterology;;   THORACIC SYMPATHETECTOMY  1965   "large incision from chest to up to  shoulder, the nerves were tied together, for raynaud's   THORACOTOMY  06/07/2013   Procedure: MINI/LIMITED THORACOTOMY; right middle lobe wedge resection;  Surgeon: Delight Ovens, MD;  Location: [facility] OR;  Service: Thoracic;;   TONSILLECTOMY  child   TOTAL ABDOMINAL HYSTERECTOMY  1980's    W/ BSO   TOTAL HIP ARTHROPLASTY Right 04/28/2014   Procedure: RIGHT TOTAL HIP ARTHROPLASTY ANTERIOR APPROACH;  Surgeon: Kathryne Hitch, MD;  Location: [facility];  Service: Orthopedics;  Laterality: Right;   TRANSTHORACIC ECHOCARDIOGRAM  12/11/2008   ef 45-50%, grade 1 diastolic dysfunction/  mild LAE/  mild AR and MR/  trivial TR   VIDEO ASSISTED THORACOSCOPY (VATS)/WEDGE RESECTION Right 06/07/2013   Procedure: VIDEO ASSISTED THORACOSCOPY (VATS)/right upper lobectomy, On Q;  Surgeon: Delight Ovens, MD;  Location: [facility] OR;  Service: Thoracic;  Laterality: Right;   VIDEO BRONCHOSCOPY N/A 06/07/2013   Procedure: VIDEO BRONCHOSCOPY;  Surgeon: Delight Ovens, MD;  Location: [facility] OR;  Service: Thoracic;  Laterality: N/A;   VIDEO BRONCHOSCOPY WITH ENDOBRONCHIAL NAVIGATION N/A 05/04/2013   Procedure: VIDEO BRONCHOSCOPY WITH ENDOBRONCHIAL NAVIGATION;  Surgeon: Delight Ovens, MD;  Location: [facility] OR;  Service: Thoracic;  Laterality: N/A;    REVIEW OF SYSTEMS:  Constitutional: positive for fatigue Eyes: negative Ears, nose, mouth, throat, and face: negative Respiratory: positive for cough and dyspnea on exertion Cardiovascular: negative Gastrointestinal: negative Genitourinary:negative Integument/breast: negative Hematologic/lymphatic: negative Musculoskeletal:negative Neurological: negative Behavioral/Psych: negative Endocrine: negative Allergic/Immunologic: negative   PHYSICAL EXAMINATION: General appearance: alert, cooperative, fatigued, and no distress Head: Normocephalic, without obvious abnormality, atraumatic Neck: no adenopathy, no JVD, supple, symmetrical, trachea midline, and thyroid not  enlarged, symmetric, no tenderness/mass/nodules Lymph nodes: Cervical, supraclavicular, and axillary nodes normal. Resp: clear to auscultation bilaterally Back: symmetric, no curvature. ROM normal. No CVA tenderness. Cardio: regular rate and rhythm, S1, S2 normal, no murmur, click, rub or gallop GI: soft, non-tender; bowel sounds normal; no masses,  no organomegaly Extremities: edema 1+ edema in the right lower extremity with erythema Neurologic: Alert and oriented X 3, normal strength and tone. Normal symmetric reflexes. Normal coordination and gait  ECOG PERFORMANCE STATUS: 1 - Symptomatic but completely ambulatory  Blood pressure 125/67, pulse 87, temperature 97.7 F (36.5 C), temperature source Oral, resp. rate 16, weight 107 lb 6.4 oz (48.7 kg), SpO2 98 %.  LABORATORY DATA: Lab Results  Component Value Date   WBC 10.5 07/21/2022   HGB 10.2 (L) 07/21/2022   HCT 31.4 (L) 07/21/2022   MCV 90.5 07/21/2022   PLT  301 07/21/2022      Chemistry      Component Value Date/Time   NA 131 (L) 07/21/2022 1012   NA 130 (L) 04/26/2021 0000   NA 130 (L) 08/21/2016 1125   K 4.6 07/21/2022 1012   K 4.8 08/21/2016 1125   CL 96 (L) 07/21/2022 1012   CO2 27 07/21/2022 1012   CO2 26 08/21/2016 1125   BUN 22 07/21/2022 1012   BUN 27 04/26/2021 0000   BUN 25.6 08/21/2016 1125   CREATININE 1.11 (H) 07/21/2022 1012   CREATININE 1.70 (H) 04/22/2022 1259   CREATININE 0.98 (H) 05/21/2020 1341   CREATININE 1.2 (H) 08/21/2016 1125      Component Value Date/Time   CALCIUM 8.7 (L) 07/21/2022 1012   CALCIUM 10.0 08/21/2016 1125   ALKPHOS 53 07/21/2022 1012   ALKPHOS 54 08/21/2016 1125   AST 18 07/21/2022 1012   AST 25 04/22/2022 1259   AST 27 08/21/2016 1125   ALT 9 07/21/2022 1012   ALT 17 04/22/2022 1259   ALT 13 08/21/2016 1125   BILITOT 0.3 07/21/2022 1012   BILITOT 0.2 (L) 04/22/2022 1259   BILITOT 0.31 08/21/2016 1125       RADIOGRAPHIC STUDIES: No results found.  ASSESSMENT  AND PLAN:  This is a very pleasant 89 years old white female with multifocal bilateral adenocarcinoma initially diagnosed as stage IIA non-small cell lung cancer, adenocarcinoma with positive EGFR mutation in exon 21 status post right upper lobectomy as well as wedge resection of the right middle lobe in March 2015 and the patient declined adjuvant systemic chemotherapy. The patient had recent imaging studies that showed increase in the size and number of multifocal disease. She is currently undergoing treatment with Tagrisso 80 mg p.o. daily started on November 19, 2021, status post 1 month of treatment. The patient was tolerating her treatment well except for the hospitalization in early November 2023 with suspicious drug-induced pneumonitis and significant shortness of breath.  There was also concern about possibility of pulmonary edema.  The patient was treated with high-dose steroids and he felt much better. The patient is currently on observation and she is feeling fine with no concerning complaints except for the baseline shortness of breath and mild cough. She had repeat CT scan of the chest performed recently.  I personally and independently reviewed the scan images and discussed the results with the patient and her daughter.  The final report for this scan is still pending but on my review there is stable to mild increase in some of these pulmonary nodules. I discussed with the patient her option for treatment including resuming treatment with EGFR TKI like Erlotinib at a reduced dose versus continuous observation and monitoring. The patient would like to continue on observation for now. I will see her back for follow-up visit in 3 months for evaluation with repeat CT scan of the chest for restaging of her disease. For the swelling of the lower extremities, she will discuss with her primary care physician for any additional investigation.  She had a Doppler few months ago that was negative. For  the C. difficile, she is currently on treatment with vancomycin and she was referred to infectious disease for evaluation. The patient was advised to call immediately if she has any other concerning symptoms in the interval. The patient voices understanding of current disease status and treatment options and is in agreement with the current care plan. All questions were answered. The patient knows to call the  clinic with any problems, questions or concerns. We can certainly see the patient much sooner if necessary. The total time spent in the appointment was 20 minutes.  Disclaimer: This note was dictated with voice recognition software. Similar sounding words can inadvertently be transcribed and may not be corrected upon review.

## 2022-07-25 ENCOUNTER — Other Ambulatory Visit: Payer: Medicare PPO

## 2022-07-29 ENCOUNTER — Ambulatory Visit: Payer: Medicare PPO | Admitting: Internal Medicine

## 2022-08-06 ENCOUNTER — Encounter (HOSPITAL_COMMUNITY): Payer: Medicare PPO

## 2022-08-07 ENCOUNTER — Other Ambulatory Visit: Payer: Self-pay | Admitting: Family Medicine

## 2022-08-07 ENCOUNTER — Encounter: Payer: Self-pay | Admitting: Family Medicine

## 2022-08-07 DIAGNOSIS — M25512 Pain in left shoulder: Secondary | ICD-10-CM

## 2022-08-11 ENCOUNTER — Other Ambulatory Visit (HOSPITAL_COMMUNITY): Payer: Self-pay

## 2022-08-11 ENCOUNTER — Encounter: Payer: Self-pay | Admitting: Internal Medicine

## 2022-08-11 ENCOUNTER — Telehealth: Payer: Self-pay

## 2022-08-11 ENCOUNTER — Other Ambulatory Visit: Payer: Self-pay

## 2022-08-11 ENCOUNTER — Ambulatory Visit: Payer: Medicare PPO | Admitting: Internal Medicine

## 2022-08-11 VITALS — BP 127/73 | HR 83 | Resp 16 | Ht 60.0 in | Wt 103.0 lb

## 2022-08-11 DIAGNOSIS — Z8619 Personal history of other infectious and parasitic diseases: Secondary | ICD-10-CM

## 2022-08-11 NOTE — Progress Notes (Signed)
RFV: recurrent cdifficile  Patient ID: [name], female   DOB: 1930/02/22, 89 y.o.   MRN: [id number]  HPI 89yo F with NSCLCa, followed by dr Gwenyth Bouillon. Had side effect from one treatment course,   Had did very well during first bought of cdiff in march,  Recurrence started about a month.usually 1-2 bm per day- normal regimen. But then when she has cdiff, with urgency, incontinence, watery. Jelly substance in her stool. With much frequency. Went back to plain diet. No abdominal cramping, no blood stool. Loss of weight during the episode. 2-4 lb down from baseline.   Finished 10 day course of dificid and symptoms improved quickly.   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   Acetaminophen (TYLENOL EXTRA STRENGTH PO) Take 1-2 tablets by mouth every 6 (six) hours as needed (pain).    Biotin 1000 MCG tablet Take 1,000 mcg by mouth daily.   Calcium Carbonate-Vitamin D 500-125 MG-UNIT TABS Take 1 tablet by mouth daily.   carvedilol (COREG) 3.125 MG tablet Take 1 tablet (3.125 mg total) by mouth 2 (two) times daily with a meal.   denosumab (PROLIA) 60 MG/ML SOSY injection Inject 60 mg into the skin every 6 (six) months.   estradiol (ESTRACE VAGINAL) 0.1 MG/GM vaginal cream Place one gram vaginally up to three times a week as needed to maintain comfort   fidaxomicin (DIFICID) 200 MG TABS tablet Take 1 tablet (200 mg total) by mouth 2 (two) times daily.   furosemide (LASIX) 40 MG tablet Take 1 tablet (40 mg total) by mouth daily.   gabapentin (NEURONTIN) 300 MG capsule TAKE 1 CAPSULE BY MOUTH IN THE MORNING, 1 IN  THE AFTERNOON, AND 2 AT BEDTIME   Glucosamine-Chondroit-Vit C-Mn (GLUCOSAMINE CHONDR 1500 COMPLX PO) Take 1 capsule by mouth daily.   HYDROcodone-acetaminophen (NORCO/VICODIN) 5-325 MG tablet Take 1 tablet by mouth 2 (two) times daily as needed for moderate pain.   levothyroxine (SYNTHROID) 75 MCG tablet TAKE 1 TABLET BY MOUTH DAILY BEFORE BREAKFAST.   methocarbamol (ROBAXIN) 500 MG tablet TAKE 1 TABLET BY MOUTH EVERY 6 HOURS AS NEEDED FOR MUSCLE SPASMS.   Multiple Vitamin (MULTIVITAMIN) tablet Take 1 tablet by mouth daily.   NON FORMULARY Diltiazem 2%/Lidocaine5% compound Use 3 x rectally daily for 2 months to heal anal fissure   omeprazole (PRILOSEC) 20 MG capsule TAKE 1 CAPSULE BY MOUTH EVERY DAY   Probiotic Product (PROBIOTIC PO) Take 1 capsule by mouth daily.    traMADol (ULTRAM) 50 MG tablet TAKE 1/2-1 TABLET BY MOUTH EVERY 8 HOURS AS NEEDED. DO NOT COMBINE WITH OTHER PAIN MEDICATION   VITAMIN D PO Take by mouth daily.   No facility-administered encounter medications on file as of 08/11/2022.     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   HTN (hypertension) 04/10/2018   Femoral artery pseudo-aneurysm, 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/09/2018   Anemia of chronic disease 02/24/2018   Unstable right hip arthroplasty 01/24/2018   History of revision of total replacement of right hip joint 01/24/2018   Hypovolemic shock (HCC)    Hyperkalemia    Hyponatremia    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   Status post revision of total hip 11/27/2017    Elevated cholesterol 10/11/2015   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HCC) 10/04/2014   Bilateral leg edema 08/01/2014   Elevated BP 08/01/2014   Rheumatoid arthritis involving multiple joints (HCC) 05/30/2014   Status post total replacement of right hip 04/28/2014   Long-term use of high-risk medication 11/11/2013   Symptomatic anemia 11/11/2013   Neuropathic pain 07/07/2013   Constipation due to pain medication 07/07/2013   Protein-calorie malnutrition, severe (HCC) 06/08/2013   Lung cancer, Right upper lobe 05/08/2013   Sciatica of right side 08/13/2011   Osteoporosis 03/07/2010   PARESTHESIA 03/07/2010   Low back pain 03/16/2009   CT, CHEST, ABNORMAL 12/18/2008   ABNORMAL ECHOCARDIOGRAM 12/14/2008   SJOGREN'S SYNDROME 11/29/2008   HYPOGLYCEMIA 06/29/2006   RAYNAUD'S DISEASE 06/29/2006     Health Maintenance Due  Topic Date Due   Zoster Vaccines- Shingrix (1 of 2) Never done   COVID-19 Vaccine (5 - 2023-24 season) 10/18/2021   Medicare Annual Wellness (AWV)  05/08/2022     Review of Systems  Physical Exam   Resp 16   Ht 5' (1.524 m)   Wt 103 lb (46.7 kg)   BMI 20.12 kg/m    No results found for: "CD4TCELL" No results found for: "CD4TABS" No results found for: "HIV1RNAQUANT" No results found for: "HEPBSAB" No results found for: "RPR", "LABRPR"  CBC Lab Results  Component Value Date   WBC 10.5 07/21/2022   RBC 3.47 (L) 07/21/2022   HGB 10.2 (L) 07/21/2022   HCT 31.4 (L) 07/21/2022   PLT 301 07/21/2022   MCV 90.5 07/21/2022   MCH 29.4 07/21/2022   MCHC 32.5 07/21/2022   RDW 14.5 07/21/2022   LYMPHSABS 1.4 07/21/2022   MONOABS 0.7 07/21/2022   EOSABS 0.1 07/21/2022    BMET Lab Results  Component Value Date   NA 131 (L) 07/21/2022   K 4.6 07/21/2022   CL 96 (L) 07/21/2022   CO2 27 07/21/2022   GLUCOSE 107 (H) 07/21/2022   BUN 22 07/21/2022   CREATININE 1.11 (H) 07/21/2022   CALCIUM 8.7 (L) 07/21/2022   GFRNONAA 48 (L) 07/21/2022   GFRAA 60 05/21/2020       Assessment and Plan 89yo F with recurrence of cdifficile. Has had 2 recent episodes of cdifficile. Does not appear to have had taper.  Need to see if can get approval for  Vowst - but need to get recent documentation of having cdiff. Will get stool kit to patient.  Should she have recurrence of diarrhea - will have her bring in sample for [place] to test/start immediately on dificid until determination if recurrence vs. Post infectious IBS.   I have personally spent 50 minutes involved in face-to-face and non-face-to-face activities for this patient on the day of the visit. Professional time spent includes the following activities: Preparing to see the patient (review of tests), Obtaining and/or reviewing separately obtained history (admission/discharge record), Performing a medically appropriate examination and/or evaluation , Ordering  medications/tests/procedures, referring and communicating with other health care professionals, Documenting clinical information in the EMR, Independently interpreting results (not separately reported), Communicating results to the patient/family/caregiver, Counseling and educating the patient/family/caregiver and Care coordination (not separately reported).

## 2022-08-11 NOTE — Telephone Encounter (Signed)
[facility] Patient Advocate Encounter   Received notification from [facility] that prior authorization for Vowst is required.   PA submitted on 08/11/22 Key [id number] Status is pending    [facility] will continue to follow.   Clearance Coots, CPhT Specialty Pharmacy Patient [facility] for Infectious Disease Phone: [phone] Fax:  [phone]

## 2022-08-13 ENCOUNTER — Telehealth: Payer: Self-pay

## 2022-08-13 NOTE — Telephone Encounter (Signed)
osteoarthritis[facility] Patient Advocate Encounter  Received notification from [facility] that the request for prior authorization for Vowst has been denied, and handed denial off to Cassie.      This encounter will continue to be updated until final determination.    Clearance Coots, CPhT Specialty Pharmacy Patient [facility] for Infectious Disease Phone: [phone] Fax:  [phone]

## 2022-08-13 NOTE — Telephone Encounter (Signed)
Spoke to Dr. Drue Second. Will wait until her next episode to pursue Vowst. It must be started within 2-4 days of finishing treatment, and patient is currently not on treatment.

## 2022-08-15 ENCOUNTER — Telehealth: Payer: Self-pay | Admitting: Family Medicine

## 2022-08-15 ENCOUNTER — Other Ambulatory Visit: Payer: Self-pay

## 2022-08-15 ENCOUNTER — Telehealth: Payer: Self-pay

## 2022-08-15 ENCOUNTER — Ambulatory Visit (HOSPITAL_COMMUNITY)
Admission: RE | Admit: 2022-08-15 | Discharge: 2022-08-15 | Disposition: A | Discharge status: Home / Self Care | Payer: Medicare PPO | Source: Ambulatory Visit | Attending: Rheumatology | Admitting: Rheumatology

## 2022-08-15 MED ORDER — DIFICID 200 MG PO TABS: ORAL_TABLET | ORAL | 0 refills | Status: DC

## 2022-08-15 NOTE — Telephone Encounter (Signed)
Pt's daughter [name] called to advise that patient seems to be having a 4th bout of cdif and she has a fever of 102. Pt hesitant to go to ED but daughter is concerned with the weekend coming up and Infectious Disease is closing at noon. Transferred daughter to triage to discuss mom's symptoms to be advised of what to do

## 2022-08-15 NOTE — Telephone Encounter (Signed)
Patient's daughter called stating the patient is having tremors,  loose stools and fevers that started this morning and was unable to get her IV infusion for her arthritis. Per Dr. Drue Second she is going to be sending in fidaxomicin for the patient.  Patient advised to stay hydrated and keep and eye on fevers. Patient refused to go to ED to be evaluated, but advised if symptoms worsen ED. Patient will bring in stool sample on Monday.  [name] T Pricilla Loveless

## 2022-08-15 NOTE — Progress Notes (Signed)
Pt here today for orencia and prolia,  99.6 temp and said she wasn't feeling well.  She stated she had diarrhea this morning, and has a headache.  She stated she has had 2 bouts with cdiff in the past.  Dr Corliss Skains wanted [place] to hold the orencia and prolia today, and have her see her PCP. She has an appt on 7/3 with her PCP and will call to rechedule with [place] after she sees her DR

## 2022-08-15 NOTE — Telephone Encounter (Signed)
Who Is Calling Patient / Member / Family / Caregiver Call Type Triage / Clinical Caller Name Larina Bras Relationship To Patient Daughter Return Phone Number [phone] (Primary) Chief Complaint Blood Pressure Low Reason for Call Symptomatic / Request for Health Information Initial Comment Caller states that her mother may have c diff. She has a fever of 101.9 and her blood pressure was 124/40 this morning. Her current blood pressure is 115/71. She also has diarrhea, and she is having tremors. Where does she need to go to be seen? Translation No Nurse Assessment Nurse: Shawnie Dapper, RN, Arline Asp Date/Time (Eastern Time): 08/15/2022 12:06:08 PM Confirm and document reason for call. If symptomatic, describe symptoms. ---Caller states that she is having fever of 101.9 and is having diarrhea. Caller states that symptoms started this morning. Caller states that she did have c-diff a couple of weeks ago and has had c-diff 3 times now. Does the patient have any new or worsening symptoms? ---Yes Will a triage be completed? ---Yes Related visit to physician within the last 2 weeks? ---N/A Does the PT have any chronic conditions? (i.e. diabetes, asthma, this includes High risk factors for pregnancy, etc.) ---Unknown Is this a behavioral health or substance abuse call? ---No Disp. Time Lamount Cohen Time) Disposition Final User 08/15/2022 12:09:50 PM Send To RN Personal Shawnie Dapper, RN, Cindy 08/15/2022 12:27:34 PM Clinical Call Yes Shawnie Dapper RN, Arline Asp Final Disposition 08/15/2022 12:27:34 PM Clinical Call Yes Shawnie Dapper, RN, Arline Asp PLEASE NOTE: All timestamps contained within this report are represented as [place] Standard Time. CONFIDENTIALTY NOTICE: This fax transmission is intended only for the addressee. It contains information that is legally privileged, confidential or otherwise protected from use or disclosure. If you are not the intended recipient, you are strictly prohibited from reviewing, disclosing,  copying using or disseminating any of this information or taking any action in reliance on or regarding this information. If you have received this fax in error, please notify us immediately by telephone so that we can arrange for its return to [place]. Phone: [phone], Toll-Free: [phone], Fax: [phone] Page: 2 of 2 Call Id: [id number] Comments User: Christy Gentles, RN Date/Time Lamount Cohen Time): 08/15/2022 12:08:40 PM Caller states that she is getting a call from infectious disease doctor on the other line. Call was disconnected. User: Christy Gentles, RN Date/Time Lamount Cohen Time): 08/15/2022 12:27:20 PM Caller states that they spoke with their infectious disease doctor and is now being put on treatment for c-diff. Caller states that she does have an appointment with Dr. Patsy Lager on July 3rd

## 2022-08-18 ENCOUNTER — Inpatient Hospital Stay (HOSPITAL_COMMUNITY): Payer: Medicare PPO

## 2022-08-18 ENCOUNTER — Encounter (HOSPITAL_BASED_OUTPATIENT_CLINIC_OR_DEPARTMENT_OTHER): Payer: Self-pay | Admitting: Urology

## 2022-08-18 ENCOUNTER — Emergency Department (HOSPITAL_BASED_OUTPATIENT_CLINIC_OR_DEPARTMENT_OTHER): Payer: Medicare PPO

## 2022-08-18 ENCOUNTER — Other Ambulatory Visit: Payer: Self-pay | Admitting: Internal Medicine

## 2022-08-18 ENCOUNTER — Other Ambulatory Visit: Payer: Medicare PPO

## 2022-08-18 ENCOUNTER — Other Ambulatory Visit: Payer: Self-pay

## 2022-08-18 ENCOUNTER — Inpatient Hospital Stay (HOSPITAL_BASED_OUTPATIENT_CLINIC_OR_DEPARTMENT_OTHER)
Admission: EM | Admit: 2022-08-18 | Discharge: 2022-08-23 | DRG: 372 | Disposition: A | Discharge status: Home / Self Care | Payer: Medicare PPO | Attending: Family Medicine | Admitting: Family Medicine

## 2022-08-18 DIAGNOSIS — Z888 Allergy status to other drugs, medicaments and biological substances status: Secondary | ICD-10-CM

## 2022-08-18 DIAGNOSIS — Z886 Allergy status to analgesic agent status: Secondary | ICD-10-CM

## 2022-08-18 DIAGNOSIS — K529 Noninfective gastroenteritis and colitis, unspecified: Secondary | ICD-10-CM | POA: Diagnosis present

## 2022-08-18 DIAGNOSIS — C7801 Secondary malignant neoplasm of right lung: Secondary | ICD-10-CM | POA: Diagnosis not present

## 2022-08-18 DIAGNOSIS — D638 Anemia in other chronic diseases classified elsewhere: Secondary | ICD-10-CM | POA: Diagnosis present

## 2022-08-18 DIAGNOSIS — Z96641 Presence of right artificial hip joint: Secondary | ICD-10-CM | POA: Diagnosis present

## 2022-08-18 DIAGNOSIS — K449 Diaphragmatic hernia without obstruction or gangrene: Secondary | ICD-10-CM | POA: Diagnosis not present

## 2022-08-18 DIAGNOSIS — R6889 Other general symptoms and signs: Secondary | ICD-10-CM | POA: Diagnosis present

## 2022-08-18 DIAGNOSIS — I7300 Raynaud's syndrome without gangrene: Secondary | ICD-10-CM | POA: Diagnosis present

## 2022-08-18 DIAGNOSIS — J90 Pleural effusion, not elsewhere classified: Secondary | ICD-10-CM | POA: Diagnosis not present

## 2022-08-18 DIAGNOSIS — Z79890 Hormone replacement therapy (postmenopausal): Secondary | ICD-10-CM

## 2022-08-18 DIAGNOSIS — Z86010 Personal history of colonic polyps: Secondary | ICD-10-CM

## 2022-08-18 DIAGNOSIS — K921 Melena: Secondary | ICD-10-CM | POA: Diagnosis not present

## 2022-08-18 DIAGNOSIS — R9431 Abnormal electrocardiogram [ECG] [EKG]: Secondary | ICD-10-CM | POA: Diagnosis not present

## 2022-08-18 DIAGNOSIS — R197 Diarrhea, unspecified: Principal | ICD-10-CM

## 2022-08-18 DIAGNOSIS — K626 Ulcer of anus and rectum: Secondary | ICD-10-CM | POA: Diagnosis present

## 2022-08-18 DIAGNOSIS — Z1152 Encounter for screening for COVID-19: Secondary | ICD-10-CM | POA: Diagnosis not present

## 2022-08-18 DIAGNOSIS — A059 Bacterial foodborne intoxication, unspecified: Secondary | ICD-10-CM | POA: Diagnosis present

## 2022-08-18 DIAGNOSIS — R1084 Generalized abdominal pain: Secondary | ICD-10-CM | POA: Diagnosis not present

## 2022-08-18 DIAGNOSIS — K580 Irritable bowel syndrome with diarrhea: Secondary | ICD-10-CM | POA: Diagnosis present

## 2022-08-18 DIAGNOSIS — K641 Second degree hemorrhoids: Secondary | ICD-10-CM | POA: Diagnosis present

## 2022-08-18 DIAGNOSIS — E871 Hypo-osmolality and hyponatremia: Secondary | ICD-10-CM | POA: Diagnosis present

## 2022-08-18 DIAGNOSIS — A09 Infectious gastroenteritis and colitis, unspecified: Secondary | ICD-10-CM

## 2022-08-18 DIAGNOSIS — M06049 Rheumatoid arthritis without rheumatoid factor, unspecified hand: Secondary | ICD-10-CM

## 2022-08-18 DIAGNOSIS — D649 Anemia, unspecified: Secondary | ICD-10-CM | POA: Diagnosis not present

## 2022-08-18 DIAGNOSIS — I420 Dilated cardiomyopathy: Secondary | ICD-10-CM | POA: Diagnosis present

## 2022-08-18 DIAGNOSIS — I6782 Cerebral ischemia: Secondary | ICD-10-CM | POA: Diagnosis not present

## 2022-08-18 DIAGNOSIS — R509 Fever, unspecified: Secondary | ICD-10-CM | POA: Diagnosis not present

## 2022-08-18 DIAGNOSIS — I472 Ventricular tachycardia, unspecified: Secondary | ICD-10-CM | POA: Diagnosis present

## 2022-08-18 DIAGNOSIS — Z85118 Personal history of other malignant neoplasm of bronchus and lung: Secondary | ICD-10-CM

## 2022-08-18 DIAGNOSIS — I351 Nonrheumatic aortic (valve) insufficiency: Secondary | ICD-10-CM | POA: Diagnosis present

## 2022-08-18 DIAGNOSIS — K602 Anal fissure, unspecified: Secondary | ICD-10-CM | POA: Diagnosis not present

## 2022-08-18 DIAGNOSIS — A0471 Enterocolitis due to Clostridium difficile, recurrent: Principal | ICD-10-CM | POA: Diagnosis present

## 2022-08-18 DIAGNOSIS — M069 Rheumatoid arthritis, unspecified: Secondary | ICD-10-CM | POA: Diagnosis not present

## 2022-08-18 DIAGNOSIS — Z902 Acquired absence of lung [part of]: Secondary | ICD-10-CM

## 2022-08-18 DIAGNOSIS — L930 Discoid lupus erythematosus: Secondary | ICD-10-CM | POA: Diagnosis present

## 2022-08-18 DIAGNOSIS — I5042 Chronic combined systolic (congestive) and diastolic (congestive) heart failure: Secondary | ICD-10-CM | POA: Diagnosis not present

## 2022-08-18 DIAGNOSIS — E039 Hypothyroidism, unspecified: Secondary | ICD-10-CM | POA: Diagnosis present

## 2022-08-18 DIAGNOSIS — I350 Nonrheumatic aortic (valve) stenosis: Secondary | ICD-10-CM | POA: Diagnosis not present

## 2022-08-18 DIAGNOSIS — N1831 Chronic kidney disease, stage 3a: Secondary | ICD-10-CM | POA: Diagnosis present

## 2022-08-18 DIAGNOSIS — D509 Iron deficiency anemia, unspecified: Secondary | ICD-10-CM | POA: Diagnosis present

## 2022-08-18 DIAGNOSIS — Z79899 Other long term (current) drug therapy: Secondary | ICD-10-CM

## 2022-08-18 DIAGNOSIS — M3500 Sicca syndrome, unspecified: Secondary | ICD-10-CM | POA: Diagnosis present

## 2022-08-18 DIAGNOSIS — I130 Hypertensive heart and chronic kidney disease with heart failure and stage 1 through stage 4 chronic kidney disease, or unspecified chronic kidney disease: Secondary | ICD-10-CM | POA: Diagnosis not present

## 2022-08-18 DIAGNOSIS — R29818 Other symptoms and signs involving the nervous system: Secondary | ICD-10-CM | POA: Diagnosis not present

## 2022-08-18 DIAGNOSIS — A0472 Enterocolitis due to Clostridium difficile, not specified as recurrent: Secondary | ICD-10-CM | POA: Diagnosis not present

## 2022-08-18 DIAGNOSIS — Z833 Family history of diabetes mellitus: Secondary | ICD-10-CM

## 2022-08-18 DIAGNOSIS — N179 Acute kidney failure, unspecified: Secondary | ICD-10-CM | POA: Diagnosis not present

## 2022-08-18 DIAGNOSIS — Z87440 Personal history of urinary (tract) infections: Secondary | ICD-10-CM | POA: Diagnosis not present

## 2022-08-18 DIAGNOSIS — I10 Essential (primary) hypertension: Secondary | ICD-10-CM | POA: Diagnosis not present

## 2022-08-18 DIAGNOSIS — C3490 Malignant neoplasm of unspecified part of unspecified bronchus or lung: Secondary | ICD-10-CM | POA: Diagnosis present

## 2022-08-18 DIAGNOSIS — M810 Age-related osteoporosis without current pathological fracture: Secondary | ICD-10-CM | POA: Diagnosis present

## 2022-08-18 DIAGNOSIS — E861 Hypovolemia: Secondary | ICD-10-CM | POA: Diagnosis present

## 2022-08-18 DIAGNOSIS — Z66 Do not resuscitate: Secondary | ICD-10-CM | POA: Diagnosis present

## 2022-08-18 DIAGNOSIS — Z8619 Personal history of other infectious and parasitic diseases: Secondary | ICD-10-CM | POA: Diagnosis not present

## 2022-08-18 DIAGNOSIS — R933 Abnormal findings on diagnostic imaging of other parts of digestive tract: Secondary | ICD-10-CM | POA: Diagnosis not present

## 2022-08-18 DIAGNOSIS — K219 Gastro-esophageal reflux disease without esophagitis: Secondary | ICD-10-CM | POA: Diagnosis present

## 2022-08-18 DIAGNOSIS — C7802 Secondary malignant neoplasm of left lung: Secondary | ICD-10-CM | POA: Diagnosis not present

## 2022-08-18 DIAGNOSIS — E860 Dehydration: Secondary | ICD-10-CM | POA: Diagnosis present

## 2022-08-18 DIAGNOSIS — I70201 Unspecified atherosclerosis of native arteries of extremities, right leg: Secondary | ICD-10-CM | POA: Diagnosis present

## 2022-08-18 DIAGNOSIS — D849 Immunodeficiency, unspecified: Secondary | ICD-10-CM | POA: Diagnosis not present

## 2022-08-18 DIAGNOSIS — Z90710 Acquired absence of both cervix and uterus: Secondary | ICD-10-CM

## 2022-08-18 DIAGNOSIS — J984 Other disorders of lung: Secondary | ICD-10-CM | POA: Diagnosis present

## 2022-08-18 DIAGNOSIS — M1990 Unspecified osteoarthritis, unspecified site: Secondary | ICD-10-CM | POA: Diagnosis present

## 2022-08-18 DIAGNOSIS — I429 Cardiomyopathy, unspecified: Secondary | ICD-10-CM | POA: Diagnosis not present

## 2022-08-18 DIAGNOSIS — J704 Drug-induced interstitial lung disorders, unspecified: Secondary | ICD-10-CM | POA: Diagnosis present

## 2022-08-18 DIAGNOSIS — Z800 Family history of malignant neoplasm of digestive organs: Secondary | ICD-10-CM

## 2022-08-18 DIAGNOSIS — J929 Pleural plaque without asbestos: Secondary | ICD-10-CM | POA: Diagnosis not present

## 2022-08-18 DIAGNOSIS — Z8249 Family history of ischemic heart disease and other diseases of the circulatory system: Secondary | ICD-10-CM

## 2022-08-18 HISTORY — DX: Enterocolitis due to Clostridium difficile, not specified as recurrent: A04.72

## 2022-08-18 LAB — CBC
HCT: 29.1 % — ABNORMAL LOW (ref 36.0–46.0)
Hemoglobin: 9.4 g/dL — ABNORMAL LOW (ref 12.0–15.0)
MCH: 28.9 pg (ref 26.0–34.0)
MCHC: 32.3 g/dL (ref 30.0–36.0)
MCV: 89.5 fL (ref 80.0–100.0)
Platelets: 209 10*3/uL (ref 150–400)
RBC: 3.25 MIL/uL — ABNORMAL LOW (ref 3.87–5.11)
RDW: 14.6 % (ref 11.5–15.5)
WBC: 14.8 10*3/uL — ABNORMAL HIGH (ref 4.0–10.5)
nRBC: 0 % (ref 0.0–0.2)

## 2022-08-18 LAB — URINALYSIS, W/ REFLEX TO CULTURE (INFECTION SUSPECTED)
Bilirubin Urine: NEGATIVE
Glucose, UA: NEGATIVE mg/dL
Hgb urine dipstick: NEGATIVE
Ketones, ur: NEGATIVE mg/dL
Nitrite: NEGATIVE
Protein, ur: NEGATIVE mg/dL
Specific Gravity, Urine: 1.01 (ref 1.005–1.030)
pH: 7 (ref 5.0–8.0)

## 2022-08-18 LAB — COMPREHENSIVE METABOLIC PANEL
ALT: 15 U/L (ref 0–44)
AST: 21 U/L (ref 15–41)
Albumin: 3 g/dL — ABNORMAL LOW (ref 3.5–5.0)
Alkaline Phosphatase: 63 U/L (ref 38–126)
Anion gap: 8 (ref 5–15)
BUN: 26 mg/dL — ABNORMAL HIGH (ref 8–23)
CO2: 24 mmol/L (ref 22–32)
Calcium: 7.7 mg/dL — ABNORMAL LOW (ref 8.9–10.3)
Chloride: 94 mmol/L — ABNORMAL LOW (ref 98–111)
Creatinine, Ser: 1.25 mg/dL — ABNORMAL HIGH (ref 0.44–1.00)
GFR, Estimated: 41 mL/min — ABNORMAL LOW (ref >60)
Glucose, Bld: 179 mg/dL — ABNORMAL HIGH (ref 70–99)
Potassium: 3.9 mmol/L (ref 3.5–5.1)
Sodium: 126 mmol/L — ABNORMAL LOW (ref 135–145)
Total Bilirubin: 0.4 mg/dL (ref 0.3–1.2)
Total Protein: 6 g/dL — ABNORMAL LOW (ref 6.5–8.1)

## 2022-08-18 LAB — C DIFFICILE QUICK SCREEN W PCR REFLEX
C Diff antigen: NEGATIVE
C Diff interpretation: NOT DETECTED
C Diff toxin: NEGATIVE

## 2022-08-18 LAB — PROTIME-INR
INR: 1 (ref 0.8–1.2)
Prothrombin Time: 13.2 seconds (ref 11.4–15.2)

## 2022-08-18 LAB — LIPASE, BLOOD: Lipase: 23 U/L (ref 11–51)

## 2022-08-18 LAB — LACTIC ACID, PLASMA: Lactic Acid, Venous: 1.1 mmol/L (ref 0.5–1.9)

## 2022-08-18 LAB — RESP PANEL BY RT-PCR (RSV, FLU A&B, COVID)  RVPGX2
Influenza A by PCR: NEGATIVE
Influenza B by PCR: NEGATIVE
Resp Syncytial Virus by PCR: NEGATIVE
SARS Coronavirus 2 by RT PCR: NEGATIVE

## 2022-08-18 MED ORDER — ACETAMINOPHEN 325 MG PO TABS
325.0000 mg | ORAL_TABLET | Freq: Once | ORAL | Status: AC
  Administered 2022-08-18: 325 mg via ORAL
  Filled 2022-08-18: qty 1

## 2022-08-18 MED ORDER — FIDAXOMICIN 200 MG PO TABS
200.0000 mg | ORAL_TABLET | Freq: Once | ORAL | Status: AC
  Filled 2022-08-18: qty 1

## 2022-08-18 MED ORDER — LACTATED RINGERS IV BOLUS (SEPSIS)
1000.0000 mL | Freq: Once | INTRAVENOUS | Status: AC
  Administered 2022-08-18: 1000 mL via INTRAVENOUS

## 2022-08-18 MED ORDER — IOHEXOL 300 MG/ML  SOLN
80.0000 mL | Freq: Once | INTRAMUSCULAR | Status: AC | PRN
  Administered 2022-08-18: 80 mL via INTRAVENOUS

## 2022-08-18 MED ORDER — LACTATED RINGERS IV SOLN: INTRAVENOUS | Status: AC

## 2022-08-18 MED ORDER — LACTATED RINGERS IV BOLUS (SEPSIS)
500.0000 mL | Freq: Once | INTRAVENOUS | Status: AC
  Administered 2022-08-18: 500 mL via INTRAVENOUS

## 2022-08-18 MED ORDER — ACETAMINOPHEN 325 MG PO TABS
650.0000 mg | ORAL_TABLET | Freq: Once | ORAL | Status: AC
  Administered 2022-08-18: 650 mg via ORAL
  Filled 2022-08-18: qty 2

## 2022-08-18 NOTE — ED Notes (Signed)
Called report to [place], Charity fundraiser

## 2022-08-18 NOTE — ED Provider Notes (Signed)
[place] [facility] Provider Note   CSN: [id number] Arrival date & time: 08/18/22  1427     History  Chief Complaint  Patient presents with   Diarrhea    [name] is a 89 y.o. female.  Pt is a 89 yo female with pmhx significant for sjogren's syndrome, raynaud's phenomenon, RA, osteoporosis, hypothyroidism, anemia, GERD, Mitchell's disease, OA, non-small cell lung cancer, cardiomyopathy, and c.diff.  Pt has had c.diff 3 times since March.  She developed fevers and diarrhea on 6/28.   Dr. Drue Second called in fidaxomicin on the 28th, but none of the pharmacies had it, so it had to be ordered.  So, she has not taken it yet. Also, pt noticed that her right arm was not working correctly on the 28th.  She said she was unable to hold anything in her right hand.  It has improved, but it still feels weak.  She has not taken anything for fever since early this am.       Home Medications Prior to Admission medications   Medication Sig Start Date End Date Taking? Authorizing Provider  Abatacept (ORENCIA IV) Inject into the vein every 28 (twenty-eight) days.    [provider]  Acetaminophen (TYLENOL EXTRA STRENGTH PO) Take 1-2 tablets by mouth every 6 (six) hours as needed (pain).     [provider]  Biotin 1000 MCG tablet Take 1,000 mcg by mouth daily.    [provider]  Calcium Carbonate-Vitamin D 500-125 MG-UNIT TABS Take 1 tablet by mouth daily.    [provider]  carvedilol (COREG) 3.125 MG tablet Take 1 tablet (3.125 mg total) by mouth 2 (two) times daily with a meal. 12/04/21   Crenshaw, Madolyn Frieze, MD  denosumab (PROLIA) 60 MG/ML SOSY injection Inject 60 mg into the skin every 6 (six) months.    [provider]  estradiol (ESTRACE VAGINAL) 0.1 MG/GM vaginal cream Place one gram vaginally up to three times a week as needed to maintain comfort 10/23/21   Copland, [name], MD  fidaxomicin (DIFICID) 200 MG  TABS tablet Take 1 tablet (200 mg total) by mouth 2 (two) times daily for 10 days Then take 1 tablet every 3 days 08/15/22   Judyann Munson, MD  furosemide (LASIX) 40 MG tablet Take 1 tablet (40 mg total) by mouth daily. 06/26/22   Lewayne Bunting, MD  gabapentin (NEURONTIN) 300 MG capsule TAKE 1 CAPSULE BY MOUTH IN THE MORNING, 1 IN THE AFTERNOON, AND 2 AT BEDTIME 01/24/22   Copland, [name], MD  Glucosamine-Chondroit-Vit C-Mn (GLUCOSAMINE CHONDR 1500 COMPLX PO) Take 1 capsule by mouth daily.    [provider]  HYDROcodone-acetaminophen (NORCO/VICODIN) 5-325 MG tablet Take 1 tablet by mouth 2 (two) times daily as needed for moderate pain. 06/03/22   Copland, [name], MD  levothyroxine (SYNTHROID) 75 MCG tablet TAKE 1 TABLET BY MOUTH DAILY BEFORE BREAKFAST. 04/28/22   Copland, [name], MD  methocarbamol (ROBAXIN) 500 MG tablet TAKE 1 TABLET BY MOUTH EVERY 6 HOURS AS NEEDED FOR MUSCLE SPASMS. 04/28/22   Kirtland Bouchard, PA-C  Multiple Vitamin (MULTIVITAMIN) tablet Take 1 tablet by mouth daily.    [provider]  NON FORMULARY Diltiazem 2%/Lidocaine5% compound Use 3 x rectally daily for 2 months to heal anal fissure 05/15/22   Doree Albee, PA-C  omeprazole (PRILOSEC) 20 MG capsule TAKE 1 CAPSULE BY MOUTH EVERY DAY 09/24/21   Copland, [name], MD  Probiotic Product (PROBIOTIC PO) Take 1 capsule by mouth daily.     [provider]  traMADol (ULTRAM) 50 MG tablet TAKE 1/2-1 TABLET BY MOUTH EVERY 8 HOURS AS NEEDED. DO NOT COMBINE WITH OTHER PAIN MEDICATION 08/07/22   Copland, [name], MD  VITAMIN D PO Take by mouth daily.    [provider]      Allergies    Amlodipine, Prochlorperazine edisylate, Aspirin, Cymbalta [duloxetine hcl], and Pamelor [nortriptyline hcl]    Review of Systems   Review of Systems  Constitutional:  Positive for chills and fever.  Gastrointestinal:  Positive for diarrhea.  All other systems reviewed and are negative.   Physical  Exam Updated Vital Signs BP 134/66   Pulse 87   Temp (!) 101 F (38.3 C)   Resp 18   Ht 5' (1.524 m)   Wt 46.7 kg   SpO2 95%   BMI 20.12 kg/m  Physical Exam Vitals and nursing note reviewed.  Constitutional:      Appearance: Normal appearance.  HENT:     Head: Normocephalic and atraumatic.     Right Ear: External ear normal.     Left Ear: External ear normal.     Nose: Nose normal.     Mouth/Throat:     Mouth: Mucous membranes are dry.  Eyes:     Extraocular Movements: Extraocular movements intact.     Conjunctiva/sclera: Conjunctivae normal.     Pupils: Pupils are equal, round, and reactive to light.  Cardiovascular:     Rate and Rhythm: Normal rate and regular rhythm.     Pulses: Normal pulses.     Heart sounds: Normal heart sounds.  Pulmonary:     Effort: Pulmonary effort is normal.     Breath sounds: Normal breath sounds.  Abdominal:     General: Abdomen is flat. Bowel sounds are normal.     Palpations: Abdomen is soft.     Tenderness: There is generalized abdominal tenderness.  Musculoskeletal:        General: Normal range of motion.     Cervical back: Normal range of motion and neck supple.  Skin:    General: Skin is warm.     Capillary Refill: Capillary refill takes less than 2 seconds.  Neurological:     General: No focal deficit present.     Mental Status: She is alert and oriented to person, place, and time.  Psychiatric:        Mood and Affect: Mood normal.        Behavior: Behavior normal.     ED Results / Procedures / Treatments   Labs (all labs ordered are listed, but only abnormal results are displayed) Labs Reviewed  COMPREHENSIVE METABOLIC PANEL - Abnormal; Notable for the following components:      Result Value   Sodium 126 (*)    Chloride 94 (*)    Glucose, Bld 179 (*)    BUN 26 (*)    Creatinine, Ser 1.25 (*)    Calcium 7.7 (*)    Total Protein 6.0 (*)    Albumin 3.0 (*)    GFR, Estimated 41 (*)    All other components within  normal limits  CBC - Abnormal; Notable for the following components:   WBC 14.8 (*)    RBC 3.25 (*)    Hemoglobin 9.4 (*)    HCT 29.1 (*)    All other components within normal limits  URINALYSIS, W/ REFLEX TO CULTURE (INFECTION SUSPECTED) -  Abnormal; Notable for the following components:   Leukocytes,Ua SMALL (*)    Bacteria, UA RARE (*)    All other components within normal limits  RESP PANEL BY RT-PCR (RSV, FLU A&B, COVID)  RVPGX2  CULTURE, BLOOD (ROUTINE X 2)  CULTURE, BLOOD (ROUTINE X 2)  C DIFFICILE QUICK SCREEN W PCR REFLEX    GASTROINTESTINAL PANEL BY PCR, STOOL (REPLACES STOOL CULTURE)  SARS CORONAVIRUS 2 BY RT PCR  LIPASE, BLOOD  LACTIC ACID, PLASMA  PROTIME-INR    EKG EKG Interpretation Date/Time:  Monday August 18 2022 15:47:14 EDT Ventricular Rate:  81 PR Interval:  175 QRS Duration:  139 QT Interval:  417 QTC Calculation: 485 R Axis:   -78  Text Interpretation: Sinus rhythm Probable left atrial enlargement Left bundle branch block No significant change since last tracing Confirmed by Jacalyn Lefevre [phone]) on 08/18/2022 4:12:25 PM  Radiology CT ABDOMEN PELVIS W CONTRAST  Result Date: 08/18/2022 CLINICAL DATA:  Abdominal pain with diarrhea and fever for 3 days. History of C difficile colitis. EXAM: CT ABDOMEN AND PELVIS WITH CONTRAST TECHNIQUE: Multidetector CT imaging of the abdomen and pelvis was performed using the standard protocol following bolus administration of intravenous contrast. RADIATION DOSE REDUCTION: This exam was performed according to the departmental dose-optimization program which includes automated exposure control, adjustment of the mA and/or kV according to patient size and/or use of iterative reconstruction technique. CONTRAST:  80mL OMNIPAQUE IOHEXOL 300 MG/ML  SOLN COMPARISON:  Abdominopelvic CT 11/01/2021.  Chest CT 07/21/2022. FINDINGS: Lower chest: Again demonstrated are multiple irregular nodules at both lung bases which have enlarged from  the abdominal CT of 11/01/2021 but are similar to the more recent chest CT of last month. These nodules remain consistent with metastatic lung cancer (no non-small cell lung cancer). Representative lesions include a medial right lower lobe nodule measuring 1.5 x 0.9 cm on image 4/3 and a left lower lobe nodule measuring 1.0 cm on image 7/3. Moderate size hiatal hernia. Hepatobiliary: The liver is normal in density without suspicious focal abnormality. A small low-density lesion peripherally in the right hepatic lobe on image 14/2 is stable, consistent with a benign finding. Stable mild periportal edema. No evidence of gallstones, gallbladder wall thickening or biliary dilatation. Pancreas: Unremarkable. No pancreatic ductal dilatation or surrounding inflammatory changes. Spleen: Normal in size without focal abnormality. Adrenals/Urinary Tract: Both adrenal glands appear normal. No evidence of urinary tract calculus, suspicious renal lesion or hydronephrosis. Probable extrarenal pelves bilaterally without evidence of ureteral obstruction. The bladder appears unremarkable for its degree of distention. Stomach/Bowel: No enteric contrast administered. As above, moderate-size hiatal hernia. The stomach otherwise appears unremarkable for its degree of distention. No small bowel distension, wall thickening or surrounding inflammation identified. There is some fecalization of the distal small bowel. There is mild colonic wall thickening extending from the cecum into the descending colon, consistent with colitis. Vascular/Lymphatic: There are no enlarged abdominal or pelvic lymph nodes. Aortic and branch vessel atherosclerosis without evidence of aneurysm or large vessel occlusion. Reproductive: Previous hysterectomy.  No evidence of adnexal mass. Other: No ascites, peritoneal nodularity or pneumoperitoneum. Musculoskeletal: Previous right total hip arthroplasty. Chronic incompletely healed fractures of the right superior  and inferior pubic rami. Severe multilevel spondylosis associated with a convex right thoracolumbar scoliosis. There are chronic bilateral L5 pars defects which contribute to a chronic anterolisthesis and biforaminal narrowing at L5-S1. No acute osseous findings are seen. Unless specific follow-up recommendations are mentioned in the findings or impression sections, no imaging  follow-up of any mentioned incidental findings is recommended. IMPRESSION: 1. Mild colonic wall thickening extending from the cecum into the descending colon, consistent with colitis. No evidence of bowel obstruction, perforation or abscess. 2. No other acute abdominopelvic findings. 3. Known metastatic lung cancer at both lung bases, similar to recent chest CT of last month. 4. Moderate-size hiatal hernia. 5. Chronic incompletely healed fractures of the right superior and inferior pubic rami. Chronic bilateral L5 pars defects with associated anterolisthesis and biforaminal narrowing at L5-S1. 6.  Aortic Atherosclerosis (ICD10-I70.0). Electronically Signed   By: Carey Bullocks M.D.   On: 08/18/2022 18:04   DG Chest Port 1 View  Result Date: 08/18/2022 CLINICAL DATA:  Fever, diarrhea.  Sepsis EXAM: PORTABLE CHEST 1 VIEW COMPARISON:  X-ray 01/02/2022 FINDINGS: Borderline enlarged cardiopericardial silhouette with a calcified aorta. Apical pleural thickening on the right-greater-than-left. There is some scarring and fibrotic changes along the medial right lung base. No pneumothorax, effusion or consolidation. There are some subtle areas of nodularity in the left mid midlung. Please correlate with CT scan of 07/21/2022 describing multiple lung nodules. Patient has a history of non-small-cell lung cancer. Overlapping cardiac leads. Diffuse chronic lung changes. Surgical clips in the medial left upper thorax. Osteopenia. Degenerative changes of the shoulders and spine IMPRESSION: Enlarged heart with chronic lung changes. Areas of scarring and  fibrotic changes. Multiple lung nodules as seen on prior CT of 07/21/2018. No consolidation or effusion. Electronically Signed   By: Karen Kays M.D.   On: 08/18/2022 18:00   CT Head Wo Contrast  Result Date: 08/18/2022 CLINICAL DATA:  Neuro deficit, acute, stroke suspected EXAM: CT HEAD WITHOUT CONTRAST TECHNIQUE: Contiguous axial images were obtained from the base of the skull through the vertex without intravenous contrast. RADIATION DOSE REDUCTION: This exam was performed according to the departmental dose-optimization program which includes automated exposure control, adjustment of the mA and/or kV according to patient size and/or use of iterative reconstruction technique. COMPARISON:  MRI head August 01, 2013. FINDINGS: Brain: No evidence of acute infarction, hemorrhage, hydrocephalus, extra-axial collection or mass lesion/mass effect. Patchy white matter hypodensities, nonspecific but compatible with chronic microvascular ischemic disease. Cerebral atrophy. Vascular: No hyperdense vessel. Skull: No acute fracture. Sinuses/Orbits: Clear sinuses.  No acute orbital findings. Other: No mastoid effusions. IMPRESSION: Senescent changes without evidence of acute abnormality. Electronically Signed   By: Feliberto Harts M.D.   On: 08/18/2022 17:59    Procedures Procedures    Medications Ordered in ED Medications  lactated ringers infusion ( Intravenous New Bag/Given 08/18/22 1753)  fidaxomicin (DIFICID) tablet 200 mg (has no administration in time range)  lactated ringers bolus 1,000 mL (0 mLs Intravenous Stopped 08/18/22 1903)    And  lactated ringers bolus 500 mL (0 mLs Intravenous Stopped 08/18/22 1658)  acetaminophen (TYLENOL) tablet 650 mg (650 mg Oral Given 08/18/22 1556)  iohexol (OMNIPAQUE) 300 MG/ML solution 80 mL (80 mLs Intravenous Contrast Given 08/18/22 1700)  acetaminophen (TYLENOL) tablet 325 mg (325 mg Oral Given 08/18/22 1903)    ED Course/ Medical Decision Making/ A&P                              Medical Decision Making Amount and/or Complexity of Data Reviewed Labs: ordered. Radiology: ordered. ECG/medicine tests: ordered.  Risk OTC drugs. Prescription drug management. Decision regarding hospitalization.   This patient presents to the ED for concern of fever and diarrhea, this involves an extensive number of  treatment options, and is a complaint that carries with it a high risk of complications and morbidity.  The differential diagnosis includes c diff, colitis, electrolyte abn, sepsis   Co morbidities that complicate the patient evaluation  sjogren's syndrome, raynaud's phenomenon, RA, osteoporosis, hypothyroidism, anemia, GERD, Mitchell's disease, OA, non-small cell lung cancer, cardiomyopathy, and c.diff   Additional history obtained:  Additional history obtained from epic chart review External records from outside source obtained and reviewed including daughter   Lab Tests:  I Ordered, and personally interpreted labs.  The pertinent results include:  cbc with wbc elevated at 14.8, hgb 9.4 (wbc 10.5 and hgb 10.2 on 6/3), cmp with glucose elevated at 179, bun 26 and cr 1.25 (stable), inr 1.0, lactic 1.1; covid/flu/rsv neg; ua neg   Imaging Studies ordered:  I ordered imaging studies including cxr, ct head, ct abd/pelvis  I independently visualized and interpreted imaging which showed  CXR: Enlarged heart with chronic lung changes. Areas of scarring and  fibrotic changes. Multiple lung nodules as seen on prior CT of  07/21/2018.    No consolidation or effusion.      Electronically Signed  CT head: Senescent changes without evidence of acute abnormality.  CT abd/pelvis: Mild colonic wall thickening extending from the cecum into the descending colon, consistent with colitis. No evidence of bowel obstruction, perforation or abscess. 2. No other acute abdominopelvic findings. 3. Known metastatic lung cancer at both lung bases, similar to recent chest  CT of last month. 4. Moderate-size hiatal hernia. 5. Chronic incompletely healed fractures of the right superior and inferior pubic rami. Chronic bilateral L5 pars defects with associated anterolisthesis and biforaminal narrowing at L5-S1. 6. Aortic Atherosclerosis (ICD10-I70.0).  I agree with the radiologist interpretation   Cardiac Monitoring:  The patient was maintained on a cardiac monitor.  I personally viewed and interpreted the cardiac monitored which showed an underlying rhythm of: nsr   Medicines ordered and prescription drug management:  I ordered medication including ivfs  for sx  Reevaluation of the patient after these medicines showed that the patient improved I have reviewed the patients home medicines and have made adjustments as needed   Test Considered:  ct   Critical Interventions:  ivfs   Consultations Obtained:  I requested consultation with the ID (Dr. Daiva Eves),  and discussed lab and imaging findings as well as pertinent plan - he recommends dificid  Pt d/w Dr. Antionette Char ([facility]) for admission   Problem List / ED Course:  C.diff: pt is dehydrated and remains febrile.  Ivfs given dificid ordered.  C diff pending. Right hand weakness:  ? CVA on Friday.  Sx improving. CT neg.  She will need MRI.   Reevaluation:  After the interventions noted above, I reevaluated the patient and found that they have :improved   Social Determinants of Health:  Lives at home   Dispostion:  After consideration of the diagnostic results and the patients response to treatment, I feel that the patent would benefit from admission.          Final Clinical Impression(s) / ED Diagnoses Final diagnoses:  Diarrhea of presumed infectious origin  Colitis    Rx / DC Orders ED Discharge Orders     None         Jacalyn Lefevre, MD 08/18/22 1945

## 2022-08-18 NOTE — ED Triage Notes (Addendum)
Pt states sent by Dr. Dallas Schimke Friday am started having shaking in hands and diarrhea and fever   Concern for C-diff  Has had it 3 times since March  No recent antibiotics

## 2022-08-18 NOTE — H&P (Incomplete)
History and Physical    Patient: [name]:[id number] DOB: [birth year] DOA: 08/18/2022 DOS: the patient was seen and examined on 08/18/2022 PCP: Copland, [name], MD  Patient coming from: Home  Chief Complaint:  Chief Complaint  Patient presents with   Diarrhea   HPI: [name] is a 89 y.o. female with medical history significant of ***         Review of Systems: {ROS_Text:26778} Past Medical History:  Diagnosis Date   Arthralgia of multiple joints    followed by dr Sharmon Revere   Arthritis    Cardiomyopathy (HCC)    Chronic constipation    Chronic inflammatory arthritis    rhemotolgist-  dr a. Sharmon Revere ([facility])   Dry eyes    eye drops used    GERD (gastroesophageal reflux disease)    H/O discoid lupus erythematosus    Hiatal hernia    History of colon polyps    Hypothyroidism    Iron deficiency anemia    LBBB (left bundle branch block) 2010   Mitchell's disease (erythromelalgia) [facility])    neurologist-  dr patel   Nocturia    Non-small cell cancer of right lung [facility]) surgeon-- dr gerhardt/  oncologist-  dr Arbutus Ped--- per lov notes no recurrence/   11-18-2017 per pt denies any symptoms   dx 2015--  Stage IIA (T2b,N0,M0) , +EGFR  mutation in exon 21, non-small cell adenocarcinoma right upper lobe---  s/p  Right upper lobectomy , right middley wedge resection and node dissection---  no chemo or radiation therapy   OA (osteoarthritis)    hands   Osteoporosis    PONV (postoperative nausea and vomiting)    likes phenergan   Raynaud's phenomenon 1965   Renal insufficiency    Rheumatoid arthritis (HCC)    Sciatica    Scoliosis    Sjogren's syndrome [facility])    Past Surgical History:  Procedure Laterality Date   ANTERIOR HIP REVISION Right 11/27/2017   Procedure: RIGHT HIP ACETABULAR REVISION;  Surgeon: Kathryne Hitch, MD;  Location: [facility] ORS;  Service: Orthopedics;  Laterality: Right;   ANTERIOR HIP REVISION Right 01/24/2018   Procedure:  OPEN REDUCTION OF DISLOCATED ANTERIOR HIP WITH REVISION OF LINER AND HIP BALL;  Surgeon: Kathryne Hitch, MD;  Location: [facility];  Service: Orthopedics;  Laterality: Right;   APPENDECTOMY  1950s   BIOPSY  04/14/2018   Procedure: BIOPSY;  Surgeon: Benancio Deeds, MD;  Location: [facility] ENDOSCOPY;  Service: Gastroenterology;;   BIOPSY  04/16/2018   Procedure: BIOPSY;  Surgeon: Lemar Lofty., MD;  Location: [facility] ENDOSCOPY;  Service: Gastroenterology;;   CARDIOVASCULAR STRESS TEST  12/2008    mild fixed basal to mid septal perfusion defect felt likely due to artifact from LBBB, no ischemia, EF 58%   COLONOSCOPY     COLONOSCOPY WITH PROPOFOL N/A 04/16/2018   Procedure: COLONOSCOPY WITH PROPOFOL;  Surgeon: Lemar Lofty., MD;  Location: [facility] ENDOSCOPY;  Service: Gastroenterology;  Laterality: N/A;   ESOPHAGOGASTRODUODENOSCOPY (EGD) WITH PROPOFOL N/A 04/14/2018   Procedure: ESOPHAGOGASTRODUODENOSCOPY (EGD) WITH PROPOFOL;  Surgeon: Benancio Deeds, MD;  Location: [facility] ENDOSCOPY;  Service: Gastroenterology;  Laterality: N/A;   FEMORAL-POPLITEAL BYPASS GRAFT Right 04/10/2018   Procedure: REPAIR RIGHT FEMORAL ARTERY PSEUDOANEURYSM, RETROPERITONEAL EXPOSURE OF ILIAC ARTERY, RIGHT POPLITEAL EMBOLECTOMY;  Surgeon: Chuck Hint, MD;  Location: [facility] OR;  Service: Vascular;  Laterality: Right;   FLEXIBLE SIGMOIDOSCOPY N/A 06/17/2019   Procedure: FLEXIBLE SIGMOIDOSCOPY;  Surgeon: Shellia Cleverly, DO;  Location: [facility] ENDOSCOPY;  Service: Gastroenterology;  Laterality: N/A;   HEMOSTASIS CLIP PLACEMENT  06/17/2019   Procedure: HEMOSTASIS CLIP PLACEMENT;  Surgeon: Shellia Cleverly, DO;  Location: [facility] ENDOSCOPY;  Service: Gastroenterology;;   LYMPH NODE DISSECTION Right 06/07/2013   Procedure: LYMPH NODE DISSECTION;  Surgeon: Delight Ovens, MD;  Location: [facility] OR;  Service: Thoracic;  Laterality: Right;   PATCH ANGIOPLASTY Right 04/10/2018   Procedure: PATCH  ANGIOPLASTY OF RIGHT FEMORAL  ARTERY USING BOVINE PATCH, PATCH ANGIOPLASTY OF RIGHT POPLITEAL ARTERY USING BOVINE PATCH;  Surgeon: Chuck Hint, MD;  Location: [facility] OR;  Service: Vascular;  Laterality: Right;   SCHLEROTHERAPY  06/17/2019   Procedure: Theresia Majors OF VARICES;  Surgeon: Shellia Cleverly, DO;  Location: [facility] ENDOSCOPY;  Service: Gastroenterology;;   THORACIC SYMPATHETECTOMY  1965   "large incision from chest to up to shoulder, the nerves were tied together, for raynaud's   THORACOTOMY  06/07/2013   Procedure: MINI/LIMITED THORACOTOMY; right middle lobe wedge resection;  Surgeon: Delight Ovens, MD;  Location: [facility] OR;  Service: Thoracic;;   TONSILLECTOMY  child   TOTAL ABDOMINAL HYSTERECTOMY  1980's    W/ BSO   TOTAL HIP ARTHROPLASTY Right 04/28/2014   Procedure: RIGHT TOTAL HIP ARTHROPLASTY ANTERIOR APPROACH;  Surgeon: Kathryne Hitch, MD;  Location: [facility];  Service: Orthopedics;  Laterality: Right;   TRANSTHORACIC ECHOCARDIOGRAM  12/11/2008   ef 45-50%, grade 1 diastolic dysfunction/  mild LAE/  mild AR and MR/  trivial TR   VIDEO ASSISTED THORACOSCOPY (VATS)/WEDGE RESECTION Right 06/07/2013   Procedure: VIDEO ASSISTED THORACOSCOPY (VATS)/right upper lobectomy, On Q;  Surgeon: Delight Ovens, MD;  Location: [facility] OR;  Service: Thoracic;  Laterality: Right;   VIDEO BRONCHOSCOPY N/A 06/07/2013   Procedure: VIDEO BRONCHOSCOPY;  Surgeon: Delight Ovens, MD;  Location: [facility] OR;  Service: Thoracic;  Laterality: N/A;   VIDEO BRONCHOSCOPY WITH ENDOBRONCHIAL NAVIGATION N/A 05/04/2013   Procedure: VIDEO BRONCHOSCOPY WITH ENDOBRONCHIAL NAVIGATION;  Surgeon: Delight Ovens, MD;  Location: [facility] OR;  Service: Thoracic;  Laterality: N/A;   Social History:  reports that she has never smoked. She has been exposed to tobacco smoke. She has never used smokeless tobacco. She reports that she does not currently use alcohol. She reports that she does not use drugs.  Allergies  Allergen Reactions   Amlodipine  Rash   Prochlorperazine Edisylate Anaphylaxis    Compazine--- tongue swells and rash   Aspirin Other (See Comments)    nose bleeds. Cannot take NSAIDS    Cymbalta [Duloxetine Hcl] Diarrhea, Nausea And Vomiting and Other (See Comments)    Increased blood pressure   Pamelor [Nortriptyline Hcl] Diarrhea and Nausea Only    Increased Heart rate and BP    Family History  Problem Relation Age of Onset   Coronary artery disease Father    Colon cancer Father    Diabetes Father    Cancer Father        colon   Other Mother 60       MVA   Healthy Sister    Healthy Brother    Healthy Daughter    Hypothyroidism Daughter    Other Brother        killed in war   Pneumonia Sister    Healthy Daughter    Esophageal cancer Neg Hx    Kidney disease Neg Hx    Liver disease Neg Hx     Prior to Admission medications   Medication Sig Start Date  End Date Taking? Authorizing Provider  Abatacept (ORENCIA IV) Inject into the vein every 28 (twenty-eight) days.    [provider]  Acetaminophen (TYLENOL EXTRA STRENGTH PO) Take 1-2 tablets by mouth every 6 (six) hours as needed (pain).     [provider]  Biotin 1000 MCG tablet Take 1,000 mcg by mouth daily.    [provider]  Calcium Carbonate-Vitamin D 500-125 MG-UNIT TABS Take 1 tablet by mouth daily.    [provider]  carvedilol (COREG) 3.125 MG tablet Take 1 tablet (3.125 mg total) by mouth 2 (two) times daily with a meal. 12/04/21   Crenshaw, Madolyn Frieze, MD  denosumab (PROLIA) 60 MG/ML SOSY injection Inject 60 mg into the skin every 6 (six) months.    [provider]  estradiol (ESTRACE VAGINAL) 0.1 MG/GM vaginal cream Place one gram vaginally up to three times a week as needed to maintain comfort 10/23/21   Copland, [name], MD  fidaxomicin (DIFICID) 200 MG TABS tablet Take 1 tablet (200 mg total) by mouth 2 (two) times daily for 10 days Then take 1 tablet every 3 days 08/15/22   Judyann Munson, MD   furosemide (LASIX) 40 MG tablet Take 1 tablet (40 mg total) by mouth daily. 06/26/22   Lewayne Bunting, MD  gabapentin (NEURONTIN) 300 MG capsule TAKE 1 CAPSULE BY MOUTH IN THE MORNING, 1 IN THE AFTERNOON, AND 2 AT BEDTIME 01/24/22   Copland, [name], MD  Glucosamine-Chondroit-Vit C-Mn (GLUCOSAMINE CHONDR 1500 COMPLX PO) Take 1 capsule by mouth daily.    [provider]  HYDROcodone-acetaminophen (NORCO/VICODIN) 5-325 MG tablet Take 1 tablet by mouth 2 (two) times daily as needed for moderate pain. 06/03/22   Copland, [name], MD  levothyroxine (SYNTHROID) 75 MCG tablet TAKE 1 TABLET BY MOUTH DAILY BEFORE BREAKFAST. 04/28/22   Copland, [name], MD  methocarbamol (ROBAXIN) 500 MG tablet TAKE 1 TABLET BY MOUTH EVERY 6 HOURS AS NEEDED FOR MUSCLE SPASMS. 04/28/22   Kirtland Bouchard, PA-C  Multiple Vitamin (MULTIVITAMIN) tablet Take 1 tablet by mouth daily.    [provider]  NON FORMULARY Diltiazem 2%/Lidocaine5% compound Use 3 x rectally daily for 2 months to heal anal fissure 05/15/22   Doree Albee, PA-C  omeprazole (PRILOSEC) 20 MG capsule TAKE 1 CAPSULE BY MOUTH EVERY DAY 09/24/21   Copland, [name], MD  Probiotic Product (PROBIOTIC PO) Take 1 capsule by mouth daily.     [provider]  traMADol (ULTRAM) 50 MG tablet TAKE 1/2-1 TABLET BY MOUTH EVERY 8 HOURS AS NEEDED. DO NOT COMBINE WITH OTHER PAIN MEDICATION 08/07/22   Copland, [name], MD  VITAMIN D PO Take by mouth daily.    [provider]    Physical Exam: Vitals:   08/18/22 1830 08/18/22 2030 08/18/22 2039 08/18/22 2134  BP: 134/66 130/62  128/63  Pulse: 87 80  87  Resp: 18 17  16   Temp: (!) 101 F (38.3 C)  98.6 F (37 C) 98.7 F (37.1 C)  TempSrc:   Oral Oral  SpO2: 95% 95%  95%  Weight:      Height:       *** Data Reviewed: {Tip this will not be part of the note when signed- Document your independent interpretation of telemetry tracing, EKG, lab, Radiology test or any other  diagnostic tests. Add any new diagnostic test ordered today. (Optional):26781} {Results:26384}  Assessment and Plan: No notes have been filed under this hospital service. Service:  Hospitalist     Advance Care Planning:   Code Status: Prior ***  Consults: ***  Family Communication: ***  Severity of Illness: The appropriate patient status for this patient is INPATIENT. Inpatient status is judged to be reasonable and necessary in order to provide the required intensity of service to ensure the patient's safety. The patient's presenting symptoms, physical exam findings, and initial radiographic and laboratory data in the context of their chronic comorbidities is felt to place them at high risk for further clinical deterioration. Furthermore, it is not anticipated that the patient will be medically stable for discharge from the hospital within 2 midnights of admission.   * I certify that at the point of admission it is my clinical judgment that the patient will require inpatient hospital care spanning beyond 2 midnights from the point of admission due to high intensity of service, high risk for further deterioration and high frequency of surveillance required.*  Author: Buena Irish, MD 08/18/2022 10:04 PM  For on call review [email or web address].

## 2022-08-18 NOTE — ED Notes (Signed)
[facility] is on the floor, paperwork given, pt belongings sent w patient

## 2022-08-18 NOTE — Progress Notes (Signed)
Plan of Care Note for accepted transfer   Patient: [name] MRN: [id number]   DOA: 08/18/2022  Facility requesting transfer: [facility]   Requesting Provider: Dr. Particia Nearing   Reason for transfer: Colitis, suspected recurrence in C diff   Facility course: 89 yr old lady with recurrent C diff colitis, RA, non-small cell lung cancer, HTN, HFmrEF, and CKD 3A who presents with fevers and diarrhea.   ID had called in fidaxomicin but she could not find a pharmacy that had it and has continued to worsen.   She also reported right hand weakness on 6/28 which persists but has improved.   She is febrile with WBC 14,800, normal lactate, sodium 126, and SCr 1.25.   CT demonstrates mild colonic wall thickening.   Blood was cultured and she was given 1.5 liters LR and fidaxomicin.   Plan of care: The patient is accepted for admission to Telemetry unit, at [facility].   Author: Briscoe Deutscher, MD 08/18/2022  Check [email or web address] for on-call coverage.  Nursing staff, Please call [facility] Admits & Consults System-Wide number on Amion as soon as patient's arrival, so appropriate admitting provider can evaluate the pt.

## 2022-08-18 NOTE — Telephone Encounter (Signed)
Called pt-.Spoke with her and her daughter [name] has been feeling weak, she has had diarrhea and also a low-grade fever She has history of recurrent C. difficile.  They tried to get a test kit from infectious disease today but got their wires crossed.  I encouraged them to bring her into the med [facility] emergency department and they plan to do so

## 2022-08-18 NOTE — Sepsis Progress Note (Signed)
Notified provider of need to order antibiotics.   

## 2022-08-18 NOTE — ED Notes (Signed)
Message send to pharmacy about status of Dificid medication

## 2022-08-18 NOTE — ED Notes (Addendum)
Attempted to call report, stated that the unit is unable to take the pt now d/t staffing issues & they have notified the [facility], declined to take report

## 2022-08-18 NOTE — Addendum Note (Signed)
Addended by: Harley Alto on: 08/18/2022 10:22 AM   Modules accepted: Orders

## 2022-08-18 NOTE — Sepsis Progress Note (Signed)
[facility] monitoring for the code sepsis protocol.  

## 2022-08-18 NOTE — Addendum Note (Signed)
Addended by: Harley Alto on: 08/18/2022 09:57 AM   Modules accepted: Orders

## 2022-08-18 NOTE — Addendum Note (Signed)
Addended by: Harley Alto on: 08/18/2022 10:17 AM   Modules accepted: Orders

## 2022-08-19 ENCOUNTER — Inpatient Hospital Stay (HOSPITAL_COMMUNITY): Payer: Medicare PPO

## 2022-08-19 ENCOUNTER — Other Ambulatory Visit: Payer: Self-pay

## 2022-08-19 DIAGNOSIS — I5042 Chronic combined systolic (congestive) and diastolic (congestive) heart failure: Secondary | ICD-10-CM

## 2022-08-19 DIAGNOSIS — A0472 Enterocolitis due to Clostridium difficile, not specified as recurrent: Secondary | ICD-10-CM | POA: Diagnosis not present

## 2022-08-19 DIAGNOSIS — M069 Rheumatoid arthritis, unspecified: Secondary | ICD-10-CM | POA: Diagnosis not present

## 2022-08-19 DIAGNOSIS — I429 Cardiomyopathy, unspecified: Secondary | ICD-10-CM

## 2022-08-19 DIAGNOSIS — K602 Anal fissure, unspecified: Secondary | ICD-10-CM

## 2022-08-19 DIAGNOSIS — Z87440 Personal history of urinary (tract) infections: Secondary | ICD-10-CM | POA: Diagnosis not present

## 2022-08-19 DIAGNOSIS — K529 Noninfective gastroenteritis and colitis, unspecified: Secondary | ICD-10-CM | POA: Diagnosis not present

## 2022-08-19 DIAGNOSIS — R509 Fever, unspecified: Secondary | ICD-10-CM | POA: Diagnosis not present

## 2022-08-19 DIAGNOSIS — I350 Nonrheumatic aortic (valve) stenosis: Secondary | ICD-10-CM

## 2022-08-19 LAB — COMPREHENSIVE METABOLIC PANEL
ALT: 15 U/L (ref 0–44)
AST: 17 U/L (ref 15–41)
Albumin: 2.8 g/dL — ABNORMAL LOW (ref 3.5–5.0)
Alkaline Phosphatase: 73 U/L (ref 38–126)
Anion gap: 8 (ref 5–15)
BUN: 17 mg/dL (ref 8–23)
CO2: 25 mmol/L (ref 22–32)
Calcium: 7.7 mg/dL — ABNORMAL LOW (ref 8.9–10.3)
Chloride: 95 mmol/L — ABNORMAL LOW (ref 98–111)
Creatinine, Ser: 1.01 mg/dL — ABNORMAL HIGH (ref 0.44–1.00)
GFR, Estimated: 53 mL/min — ABNORMAL LOW (ref >60)
Glucose, Bld: 105 mg/dL — ABNORMAL HIGH (ref 70–99)
Potassium: 3.7 mmol/L (ref 3.5–5.1)
Sodium: 128 mmol/L — ABNORMAL LOW (ref 135–145)
Total Bilirubin: 0.2 mg/dL — ABNORMAL LOW (ref 0.3–1.2)
Total Protein: 5.6 g/dL — ABNORMAL LOW (ref 6.5–8.1)

## 2022-08-19 LAB — CBC
HCT: 30.9 % — ABNORMAL LOW (ref 36.0–46.0)
Hemoglobin: 9.6 g/dL — ABNORMAL LOW (ref 12.0–15.0)
MCH: 28.4 pg (ref 26.0–34.0)
MCHC: 31.1 g/dL (ref 30.0–36.0)
MCV: 91.4 fL (ref 80.0–100.0)
Platelets: 203 10*3/uL (ref 150–400)
RBC: 3.38 MIL/uL — ABNORMAL LOW (ref 3.87–5.11)
RDW: 14.6 % (ref 11.5–15.5)
WBC: 13.9 10*3/uL — ABNORMAL HIGH (ref 4.0–10.5)
nRBC: 0 % (ref 0.0–0.2)

## 2022-08-19 LAB — GASTROINTESTINAL PANEL BY PCR, STOOL (REPLACES STOOL CULTURE)

## 2022-08-19 MED ORDER — ACETAMINOPHEN 325 MG PO TABS
650.0000 mg | ORAL_TABLET | Freq: Four times a day (QID) | ORAL | Status: DC | PRN
  Administered 2022-08-19 – 2022-08-23 (×7): 650 mg via ORAL
  Filled 2022-08-19 (×7): qty 2

## 2022-08-19 MED ORDER — FIDAXOMICIN 200 MG PO TABS: 200.0000 mg | ORAL_TABLET | Freq: Two times a day (BID) | ORAL | Status: DC

## 2022-08-19 MED ORDER — IOHEXOL 300 MG/ML  SOLN
75.0000 mL | Freq: Once | INTRAMUSCULAR | Status: AC | PRN
  Administered 2022-08-19: 75 mL via INTRAVENOUS

## 2022-08-19 MED ORDER — CARVEDILOL 3.125 MG PO TABS
3.1250 mg | ORAL_TABLET | Freq: Two times a day (BID) | ORAL | Status: DC
  Administered 2022-08-19 – 2022-08-23 (×8): 3.125 mg via ORAL
  Filled 2022-08-19 (×8): qty 1

## 2022-08-19 MED ORDER — LACTATED RINGERS IV SOLN: INTRAVENOUS | Status: AC

## 2022-08-19 MED ORDER — FIDAXOMICIN 200 MG PO TABS
200.0000 mg | ORAL_TABLET | Freq: Two times a day (BID) | ORAL | Status: DC
  Administered 2022-08-19: 200 mg via ORAL
  Filled 2022-08-19: qty 1

## 2022-08-19 MED ORDER — ORAL CARE MOUTH RINSE: 15.0000 mL | OROMUCOSAL | Status: DC | PRN

## 2022-08-19 MED ORDER — ENOXAPARIN SODIUM 30 MG/0.3ML IJ SOSY
30.0000 mg | PREFILLED_SYRINGE | INTRAMUSCULAR | Status: DC
  Administered 2022-08-19 – 2022-08-23 (×5): 30 mg via SUBCUTANEOUS
  Filled 2022-08-19 (×5): qty 0.3

## 2022-08-19 MED ORDER — LEVOTHYROXINE SODIUM 50 MCG PO TABS
75.0000 ug | ORAL_TABLET | Freq: Every day | ORAL | Status: DC
  Administered 2022-08-20 – 2022-08-23 (×4): 75 ug via ORAL
  Filled 2022-08-19 (×4): qty 1

## 2022-08-19 MED ORDER — TRAMADOL HCL 50 MG PO TABS
50.0000 mg | ORAL_TABLET | Freq: Two times a day (BID) | ORAL | Status: DC | PRN
  Administered 2022-08-19 – 2022-08-22 (×4): 50 mg via ORAL
  Filled 2022-08-19 (×4): qty 1

## 2022-08-19 NOTE — Consult Note (Signed)
Date of Admission:  08/18/2022          Reason for Consult: ? Recurrent C difficile, FUO    Referring Provider: Hazeline Junker, MD   Assessment:  ? 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 Anal fissure  Plan:  Hold dificid and observe clnically I will obtain a CT of the chest to look for potential cause of fevers there I will check a repeat echocardiogram Will follow-up blood cultures May need GI to see her to come up with workup and treatment for non C difficile, noninfectious cause of loose bowel movments  Principal Problem:   Colitis   Scheduled Meds:  carvedilol  3.125 mg Oral BID WC   enoxaparin (LOVENOX) injection  30 mg Subcutaneous Q24H   [START ON 08/20/2022] levothyroxine  75 mcg Oral Q0600   Continuous Infusions:  lactated ringers     PRN Meds:.acetaminophen, mouth rinse, traMADol  HPI: [name] is a 89 y.o. female with hx of NSCLC followed by Dr. Shirline Frees, seronegative RA followed by Dr. Lynda Rainwater and previously on Orencia, CKD, who has been seen by my partner Dr. Drue Second for recurrent c difficile colitis as an outpatient at [facility]. She was diagnosed with first episode of C difficile in March when clinically she certainly sounded as if she had it with nearly constant diarrhea and inability to get from kitchen to the bedroom without haivng watery bowel movement. Her stool test for diagnosis was a Diatherix which I undersatnd is a PCR panel and if so  in itself cannot distinguish between infection or colonization. She was treated with oral vancomycin with improvement. She then  experienced recurrence of symptoms and tested + for C difficile by PCR test yet again on June 17, 2022. She was then given vancomycin for a few days while PA for dificid obtained and she was given 10 day course on Mary 2nd. She had recurrence of loose bowel movements this past week though they are not liquid and watery as in the past. She also has had fevers up to 102 and had low blood pressure and episode where she could not use her right hand. Dr. Drue Second called in dificid empirically but no pharmacy had it and she remained without treatment. She came to [facility] for evalution.  I was called regarding whether or not she should be admitted and recommended given her age and symptoms that she should be admitted.  Testing at [facility] which included C. difficile antigen and toxin was actually negative as was GI pathogen panel.  She also had a COVID-19 and RSV test performed which were both negative.  She had developed also a cough and respiratory symptoms and chest x-ray was performed which showed an enlarged heart with chronic changes of scarring and fibrotic changes as well as multiple lung nodules corresponding to her known malignancy.  She had a CT abdomen pelvis performed that was read as showing mild colonic wall thickening from the cecum to the descending colon concerning for colitis without evidence of bowel obstruction perforation or abscess.  Known metastatic lung cancer at the bases was seen and  found to be similar to prior CT scan.  She also found to have incompletely healed fracture of the right superior and inferior pubic rami and chronic bilateral L5 pars defects as well as aortic atherosclerosis.  Blood cultures were also taken.  MRI of the brain without contrast was performed did not show any acute intracranial pathology.  She feels better overnight although she only received 1 dose of Dificid late last night.  I am a bit puzzled by her clinical picture.  Her C.  difficile testing does not fit with recurrent C. difficile colitis.  While her CT scan does show abnormalities in the colon it is not overwhelming for colitis and I do not know if this is might not reflect prior episodes rather than the current episode.  The fact that she also continues to have fevers despite having been treated with Dificid makes me concerned that she may have a different systemic infection.  Her episode of right arm weakness that which was transient is concerning for potential TIA in the context of potential hypoperfusion due to low blood pressure.  For now I would recommend expanding her workup for FUO by getting a CT of the chest with contrast keeping him on her known malignancy to evaluate for other pathology such as postobstructive pneumonia.  We will walk monitor blood cultures I am also going to order repeat echocardiogram.  We will hold her Dificid to see how she does clinically without active treatment for C. difficile colitis.  I do wonder if some of her GI complaints and loose bowel movements are not related to C. difficile but potentially to a post C. difficile IBS-like phenomena.  Again her other symptoms of fevers and low blood pressure and weakness are certainly worrisome and merit further workup.  I did double check and she has not been on any antibiotics that are active against C. difficile in the past 2 weeks.  I have personally spent 84 minutes involved in face-to-face and non-face-to-face activities for this patient on the day of the visit. Professional time spent includes the following activities: Preparing to see the patient (review of tests), Obtaining and/or reviewing separately obtained history (admission/discharge record), Performing a medically appropriate examination and/or evaluation , Ordering medications/tests/procedures, referring and communicating with other health care professionals, Documenting clinical information in the EMR, Independently  interpreting results (not separately reported), Communicating results to the patient/family/caregiver, Counseling and educating the patient/family/caregiver and Care coordination (not separately reported).        Review of Systems: Review of Systems  Constitutional:  Positive for chills, fever and malaise/fatigue. Negative for weight loss.  HENT:  Negative for congestion and sore throat.   Eyes:  Negative for blurred vision and photophobia.  Respiratory:  Positive for cough. Negative for shortness of breath and wheezing.   Cardiovascular:  Negative for chest pain, palpitations and leg swelling.  Gastrointestinal:  Positive for diarrhea. Negative for abdominal pain, blood in stool, constipation, heartburn, melena, nausea and vomiting.  Genitourinary:  Negative for dysuria, flank pain and hematuria.  Musculoskeletal:  Negative for back pain, falls, joint pain and myalgias.  Skin:  Negative for itching and rash.  Neurological:  Positive for focal weakness and weakness. Negative for dizziness, loss of consciousness and headaches.  Endo/Heme/Allergies:  Does not bruise/bleed easily.  Psychiatric/Behavioral:  Negative for depression and suicidal ideas. The patient does not have insomnia.     Past Medical History:  Diagnosis Date   Arthralgia of multiple joints    followed by  dr Sharmon Revere   Arthritis    Cardiomyopathy [facility])    Chronic constipation    Chronic inflammatory arthritis    rhemotolgist-  dr a. Sharmon Revere ([facility])   Dry eyes    eye drops used    GERD (gastroesophageal reflux disease)    H/O discoid lupus erythematosus    Hiatal hernia    History of colon polyps    Hypothyroidism    Iron deficiency anemia    LBBB (left bundle branch block) 2010   Mitchell's disease (erythromelalgia) [facility])    neurologist-  dr patel   Nocturia    Non-small cell cancer of right lung [facility]) surgeon-- dr gerhardt/  oncologist-  dr Arbutus Ped--- per lov notes no recurrence/   11-18-2017  per pt denies any symptoms   dx 2015--  Stage IIA (T2b,N0,M0) , +EGFR  mutation in exon 21, non-small cell adenocarcinoma right upper lobe---  s/p  Right upper lobectomy , right middley wedge resection and node dissection---  no chemo or radiation therapy   OA (osteoarthritis)    hands   Osteoporosis    PONV (postoperative nausea and vomiting)    likes phenergan   Raynaud's phenomenon 1965   Renal insufficiency    Rheumatoid arthritis (HCC)    Sciatica    Scoliosis    Sjogren's syndrome (HCC)     Social History   Tobacco Use   Smoking status: Never    Passive exposure: Past   Smokeless tobacco: Never  Vaping Use   Vaping Use: Never used  Substance Use Topics   Alcohol use: Not Currently   Drug use: Never    Family History  Problem Relation Age of Onset   Coronary artery disease Father    Colon cancer Father    Diabetes Father    Cancer Father        colon   Other Mother 66       MVA   Healthy Sister    Healthy Brother    Healthy Daughter    Hypothyroidism Daughter    Other Brother        killed in war   Pneumonia Sister    Healthy Daughter    Esophageal cancer Neg Hx    Kidney disease Neg Hx    Liver disease Neg Hx    Allergies  Allergen Reactions   Amlodipine Rash   Prochlorperazine Edisylate Anaphylaxis    Compazine--- tongue swells and rash   Aspirin Other (See Comments)    Nose bleeds. Cannot take NSAIDS    Cymbalta [Duloxetine Hcl] Diarrhea, Nausea And Vomiting and Other (See Comments)    Increased blood pressure   Pamelor [Nortriptyline Hcl] Diarrhea and Nausea Only    Increased Heart rate and BP    OBJECTIVE: Blood pressure (!) 129/57, pulse 96, temperature (!) 101 F (38.3 C), temperature source Oral, resp. rate 18, height 5' (1.524 m), weight 46.7 kg, SpO2 96 %.  Physical Exam Constitutional:      General: She is not in acute distress.    Appearance: Normal appearance. She is not ill-appearing or diaphoretic.  HENT:     Head:  Normocephalic and atraumatic.     Right Ear: Hearing and external ear normal.     Left Ear: Hearing and external ear normal.     Nose: No nasal deformity or rhinorrhea.  Eyes:     General: No scleral icterus.    Extraocular Movements: Extraocular movements intact.     Conjunctiva/sclera: Conjunctivae normal.  Right eye: Right conjunctiva is not injected.     Left eye: Left conjunctiva is not injected.     Pupils: Pupils are equal, round, and reactive to light.  Neck:     Vascular: No JVD.  Cardiovascular:     Rate and Rhythm: Normal rate and regular rhythm.     Heart sounds: S1 normal and S2 normal. Murmur heard.     No friction rub.  Pulmonary:     Effort: No respiratory distress.     Breath sounds: No stridor. Wheezing present. No rhonchi.  Abdominal:     General: Bowel sounds are normal. There is no distension.     Palpations: Abdomen is soft.  Musculoskeletal:        General: Normal range of motion.     Right shoulder: Normal.     Left shoulder: Normal.     Cervical back: Normal range of motion and neck supple.     Right hip: Normal.     Left hip: Normal.     Right knee: Normal.     Left knee: Normal.  Lymphadenopathy:     Head:     Right side of head: No submandibular, preauricular or posterior auricular adenopathy.     Left side of head: No submandibular, preauricular or posterior auricular adenopathy.     Cervical: No cervical adenopathy.     Right cervical: No superficial or deep cervical adenopathy.    Left cervical: No superficial or deep cervical adenopathy.  Skin:    General: Skin is warm and dry.     Coloration: Skin is not pale.     Findings: No abrasion, bruising, ecchymosis, erythema, lesion or rash.     Nails: There is no clubbing.  Neurological:     Mental Status: She is alert and oriented to person, place, and time.     Sensory: No sensory deficit.     Coordination: Coordination normal.     Gait: Gait normal.  Psychiatric:        Attention and  Perception: She is attentive.        Mood and Affect: Mood normal.        Speech: Speech normal.        Behavior: Behavior normal. Behavior is cooperative.        Thought Content: Thought content normal.        Judgment: Judgment normal.     Lab Results Lab Results  Component Value Date   WBC 13.9 (H) 08/19/2022   HGB 9.6 (L) 08/19/2022   HCT 30.9 (L) 08/19/2022   MCV 91.4 08/19/2022   PLT 203 08/19/2022    Lab Results  Component Value Date   CREATININE 1.01 (H) 08/19/2022   BUN 17 08/19/2022   NA 128 (L) 08/19/2022   K 3.7 08/19/2022   CL 95 (L) 08/19/2022   CO2 25 08/19/2022    Lab Results  Component Value Date   ALT 15 08/19/2022   AST 17 08/19/2022   ALKPHOS 73 08/19/2022   BILITOT 0.2 (L) 08/19/2022     Microbiology: Recent Results (from the past 240 hour(s))  Gastrointestinal Panel by PCR , Stool     Status: None   Collection Time: 08/18/22  3:19 PM   Specimen: Urine, Clean Catch; Stool  Result Value Ref Range Status   Campylobacter species NOT DETECTED NOT DETECTED Final   Plesimonas shigelloides NOT DETECTED NOT DETECTED Final   Salmonella species NOT DETECTED NOT DETECTED Final   Yersinia enterocolitica  NOT DETECTED NOT DETECTED Final   Vibrio species NOT DETECTED NOT DETECTED Final   Vibrio cholerae NOT DETECTED NOT DETECTED Final   Enteroaggregative E coli (EAEC) NOT DETECTED NOT DETECTED Final   Enteropathogenic E coli (EPEC) NOT DETECTED NOT DETECTED Final   Enterotoxigenic E coli (ETEC) NOT DETECTED NOT DETECTED Final   Shiga like toxin producing E coli (STEC) NOT DETECTED NOT DETECTED Final   Shigella/Enteroinvasive E coli (EIEC) NOT DETECTED NOT DETECTED Final   Cryptosporidium NOT DETECTED NOT DETECTED Final   Cyclospora cayetanensis NOT DETECTED NOT DETECTED Final   Entamoeba histolytica NOT DETECTED NOT DETECTED Final   Giardia lamblia NOT DETECTED NOT DETECTED Final   Adenovirus F40/41 NOT DETECTED NOT DETECTED Final   Astrovirus NOT  DETECTED NOT DETECTED Final   Norovirus GI/GII NOT DETECTED NOT DETECTED Final   Rotavirus A NOT DETECTED NOT DETECTED Final   Sapovirus (I, II, IV, and V) NOT DETECTED NOT DETECTED Final    Comment: Performed at [facility], [address] [place], [place] [address]  Resp panel by RT-PCR (RSV, Flu A&B, Covid) Anterior Nasal Swab     Status: None   Collection Time: 08/18/22  3:53 PM   Specimen: Anterior Nasal Swab  Result Value Ref Range Status   SARS Coronavirus 2 by RT PCR NEGATIVE NEGATIVE Final    Comment: (NOTE) SARS-CoV-2 target nucleic acids are NOT DETECTED.  The SARS-CoV-2 RNA is generally detectable in upper respiratory specimens during the acute phase of infection. The lowest concentration of SARS-CoV-2 viral copies this assay can detect is 138 copies/mL. A negative result does not preclude SARS-Cov-2 infection and should not be used as the sole basis for treatment or other patient management decisions. A negative result may occur with  improper specimen collection/handling, submission of specimen other than nasopharyngeal swab, presence of viral mutation(s) within the areas targeted by this assay, and inadequate number of viral copies(<138 copies/mL). A negative result must be combined with clinical observations, patient history, and epidemiological information. The expected result is Negative.  Fact Sheet for Patients:  BloggerCourse.com  Fact Sheet for Healthcare Providers:  SeriousBroker.it  This test is no t yet approved or cleared by the [place] FDA and  has been authorized for detection and/or diagnosis of SARS-CoV-2 by FDA under an Emergency Use Authorization (EUA). This EUA will remain  in effect (meaning this test can be used) for the duration of the COVID-19 declaration under Section 564(b)(1) of the Act, 21 U.S.C.section 360bbb-3(b)(1), unless the authorization is terminated  or revoked  sooner.       Influenza A by PCR NEGATIVE NEGATIVE Final   Influenza B by PCR NEGATIVE NEGATIVE Final    Comment: (NOTE) The Xpert Xpress SARS-CoV-2/FLU/RSV plus assay is intended as an aid in the diagnosis of influenza from Nasopharyngeal swab specimens and should not be used as a sole basis for treatment. Nasal washings and aspirates are unacceptable for Xpert Xpress SARS-CoV-2/FLU/RSV testing.  Fact Sheet for Patients: BloggerCourse.com  Fact Sheet for Healthcare Providers: SeriousBroker.it  This test is not yet approved or cleared by the [place] FDA and has been authorized for detection and/or diagnosis of SARS-CoV-2 by FDA under an Emergency Use Authorization (EUA). This EUA will remain in effect (meaning this test can be used) for the duration of the COVID-19 declaration under Section 564(b)(1) of the Act, 21 U.S.C. section 360bbb-3(b)(1), unless the authorization is terminated or revoked.     Resp Syncytial Virus by PCR NEGATIVE NEGATIVE  Final    Comment: (NOTE) Fact Sheet for Patients: BloggerCourse.com  Fact Sheet for Healthcare Providers: SeriousBroker.it  This test is not yet approved or cleared by the [place] FDA and has been authorized for detection and/or diagnosis of SARS-CoV-2 by FDA under an Emergency Use Authorization (EUA). This EUA will remain in effect (meaning this test can be used) for the duration of the COVID-19 declaration under Section 564(b)(1) of the Act, 21 U.S.C. section 360bbb-3(b)(1), unless the authorization is terminated or revoked.  Performed at [facility], [address] [place], [place] [address]   Blood Culture (routine x 2)     Status: None (Preliminary result)   Collection Time: 08/18/22  4:05 PM   Specimen: BLOOD  Result Value Ref Range Status   Specimen Description   Final    BLOOD LEFT  ANTECUBITAL Performed at [facility], [address] [place], [place] [address]    Special Requests   Final    Blood Culture adequate volume BOTTLES DRAWN AEROBIC AND ANAEROBIC Performed at [facility], [address] [place], [place] [address]    Culture   Final    NO GROWTH < 24 HOURS Performed at [facility], [address] [place], [place] [address]    Report Status PENDING  Incomplete  Blood Culture (routine x 2)     Status: None (Preliminary result)   Collection Time: 08/18/22  4:10 PM   Specimen: BLOOD  Result Value Ref Range Status   Specimen Description   Final    BLOOD BLOOD LEFT ARM Performed at [facility], [address] [place], [place] [address]    Special Requests   Final    Blood Culture adequate volume BOTTLES DRAWN AEROBIC AND ANAEROBIC Performed at [facility], [address] [place], [place] [address]    Culture   Final    NO GROWTH < 24 HOURS Performed at [facility], [address], [place], [place] [address]    Report Status PENDING  Incomplete  C Difficile Quick Screen w PCR reflex     Status: None   Collection Time: 08/18/22  6:12 PM   Specimen: Urine, Clean Catch; Stool  Result Value Ref Range Status   C Diff antigen NEGATIVE NEGATIVE Final   C Diff toxin NEGATIVE NEGATIVE Final   C Diff interpretation No C. difficile detected.  Final    Comment: Performed at [facility], [address], [place], [place] [address]    Acey Lav, MD [facility] [phone] pager  08/19/2022, 4:43 PM

## 2022-08-19 NOTE — Progress Notes (Signed)
[facility] PROGRESS NOTE  [name] (DOB: [birth year]) EXB:[id number] PCP: Pearline Cables, MD Outpatient Specialists: [facility], Dr. Drue Second  Brief Narrative: [name] is an 89 y.o. female with a history of recurrent C. diff colitis, RA on abatacept, NSCLC, HFmrEF, stage IIIa CKD, HTN who presented to the ED on 08/18/2022 with fevers and diarrhea. She was febrile to 101F, with leukocytosis (14.8k), mild abdominal tenderness, ongoing watery stools, and colonic wall thickening from cecum to descending colon on CT. She was admitted for presumed recurrent C. diff colitis.  Subjective: Not hungry, had 2 watery whitish BMs overnight, no abdominal pain currently. Low grade fever this morning.   Objective: BP (!) 131/58   Pulse 95   Temp (!) 100.4 F (38 C) (Oral)   Resp 18   Ht 5' (1.524 m)   Wt 46.7 kg   SpO2 93%   BMI 20.12 kg/m   Gen: No distress, elderly female Pulm: Clear, nonlabored  CV: III/VI systolic murmur at apex GI: Soft, ND, but modestly tender to L > R abdomen without rebound. +BS heard in thoracic cavity as well.  Neuro: Alert and oriented. No new focal deficits. Ext: Warm, RA deformities noted. Skin: No new rashes, lesions or ulcers on visualized skin   Assessment & Plan: Pancolitis: With fever in immunosuppressed patient and leukocytosis, her symptoms of watery stools and history of recurrent C. difficile infections strongly suggest C. diff colitis.  - Continue fidaxomicin presumptively for recurrent C. diff colitis. Abdomen slightly tender but reassuring.  - ID consulted - Maintain precautions  - Monitor blood cultures  AKI on stage IIIa CKD: Presumed based on improvement thus far. CrCl still < 99ml/min.  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 - Monitor I/O, with GI losses she remains hypovolemic, will continue  IVF - Hold lasix 40mg  for today, restart home coreg. Not on ACE/ARB/ARNI presumably due to renal impairment.  RA: On abatacept as outpatient. - Reorder home low dose tramadol q12h prn - Avoid NSAIDs  NSCLC: s/p RUL lobectomy, RML wedge resection April 2015, declined adjuvant chemotherapy, but received 4 weeks tagrisso Oct 2023 with increase in disease but did not tolerate. Most recent scan showed increase, following with Dr. Arbutus Ped under observation per shared decision making.   Hyponatremia: Improving.  - Continue isotonic saline, judicious rate (1L over next 20 hours) with CHF  Hypothyroidism:  - Continue synthroid  GERD:  - No current severe symptoms. In light of recurrent C. diff, would suggest stopping PPI.  Tyrone Nine, MD [facility] [email or web address] 08/19/2022, 11:39 AM

## 2022-08-19 NOTE — Plan of Care (Signed)
  Problem: Clinical Measurements: Goal: Diagnostic test results will improve Outcome: Progressing   Problem: Activity: Goal: Risk for activity intolerance will decrease Outcome: Progressing   Problem: Pain Managment: Goal: General experience of comfort will improve Outcome: Progressing   Problem: Safety: Goal: Ability to remain free from injury will improve Outcome: Progressing   

## 2022-08-20 ENCOUNTER — Encounter (HOSPITAL_COMMUNITY): Payer: Medicare PPO

## 2022-08-20 ENCOUNTER — Other Ambulatory Visit (HOSPITAL_COMMUNITY): Payer: Medicare PPO

## 2022-08-20 ENCOUNTER — Ambulatory Visit: Payer: Medicare PPO | Admitting: Family Medicine

## 2022-08-20 DIAGNOSIS — Z8619 Personal history of other infectious and parasitic diseases: Secondary | ICD-10-CM | POA: Diagnosis not present

## 2022-08-20 DIAGNOSIS — K529 Noninfective gastroenteritis and colitis, unspecified: Secondary | ICD-10-CM | POA: Diagnosis not present

## 2022-08-20 DIAGNOSIS — A09 Infectious gastroenteritis and colitis, unspecified: Secondary | ICD-10-CM | POA: Diagnosis not present

## 2022-08-20 DIAGNOSIS — A0472 Enterocolitis due to Clostridium difficile, not specified as recurrent: Secondary | ICD-10-CM | POA: Diagnosis not present

## 2022-08-20 DIAGNOSIS — R1084 Generalized abdominal pain: Secondary | ICD-10-CM

## 2022-08-20 DIAGNOSIS — Z87440 Personal history of urinary (tract) infections: Secondary | ICD-10-CM | POA: Diagnosis not present

## 2022-08-20 DIAGNOSIS — R933 Abnormal findings on diagnostic imaging of other parts of digestive tract: Secondary | ICD-10-CM | POA: Diagnosis not present

## 2022-08-20 DIAGNOSIS — R509 Fever, unspecified: Secondary | ICD-10-CM | POA: Diagnosis not present

## 2022-08-20 DIAGNOSIS — M069 Rheumatoid arthritis, unspecified: Secondary | ICD-10-CM | POA: Diagnosis not present

## 2022-08-20 LAB — CBC WITH DIFFERENTIAL/PLATELET
Abs Immature Granulocytes: 0.38 10*3/uL — ABNORMAL HIGH (ref 0.00–0.07)
Basophils Absolute: 0.1 10*3/uL (ref 0.0–0.1)
Basophils Relative: 0 %
Eosinophils Absolute: 0.2 10*3/uL (ref 0.0–0.5)
Eosinophils Relative: 1 %
HCT: 31.6 % — ABNORMAL LOW (ref 36.0–46.0)
Hemoglobin: 9.8 g/dL — ABNORMAL LOW (ref 12.0–15.0)
Immature Granulocytes: 2 %
Lymphocytes Relative: 5 %
Lymphs Abs: 1.2 10*3/uL (ref 0.7–4.0)
MCH: 28.3 pg (ref 26.0–34.0)
MCHC: 31 g/dL (ref 30.0–36.0)
MCV: 91.3 fL (ref 80.0–100.0)
Monocytes Absolute: 1.7 10*3/uL — ABNORMAL HIGH (ref 0.1–1.0)
Monocytes Relative: 7 %
Neutro Abs: 21.1 10*3/uL — ABNORMAL HIGH (ref 1.7–7.7)
Neutrophils Relative %: 85 %
Platelets: 239 10*3/uL (ref 150–400)
RBC: 3.46 MIL/uL — ABNORMAL LOW (ref 3.87–5.11)
RDW: 14.7 % (ref 11.5–15.5)
WBC: 24.6 10*3/uL — ABNORMAL HIGH (ref 4.0–10.5)
nRBC: 0 % (ref 0.0–0.2)

## 2022-08-20 LAB — CBC
HCT: 28.8 % — ABNORMAL LOW (ref 36.0–46.0)
Hemoglobin: 8.9 g/dL — ABNORMAL LOW (ref 12.0–15.0)
MCH: 27.9 pg (ref 26.0–34.0)
MCHC: 30.9 g/dL (ref 30.0–36.0)
MCV: 90.3 fL (ref 80.0–100.0)
Platelets: 196 10*3/uL (ref 150–400)
RBC: 3.19 MIL/uL — ABNORMAL LOW (ref 3.87–5.11)
RDW: 14.6 % (ref 11.5–15.5)
WBC: 22.4 10*3/uL — ABNORMAL HIGH (ref 4.0–10.5)
nRBC: 0 % (ref 0.0–0.2)

## 2022-08-20 LAB — BASIC METABOLIC PANEL
Anion gap: 9 (ref 5–15)
BUN: 14 mg/dL (ref 8–23)
CO2: 24 mmol/L (ref 22–32)
Calcium: 7.6 mg/dL — ABNORMAL LOW (ref 8.9–10.3)
Chloride: 96 mmol/L — ABNORMAL LOW (ref 98–111)
Creatinine, Ser: 1.16 mg/dL — ABNORMAL HIGH (ref 0.44–1.00)
GFR, Estimated: 45 mL/min — ABNORMAL LOW (ref >60)
Glucose, Bld: 99 mg/dL (ref 70–99)
Potassium: 3.5 mmol/L (ref 3.5–5.1)
Sodium: 129 mmol/L — ABNORMAL LOW (ref 135–145)

## 2022-08-20 MED ORDER — FIDAXOMICIN 200 MG PO TABS
200.0000 mg | ORAL_TABLET | Freq: Two times a day (BID) | ORAL | Status: DC
  Administered 2022-08-21 – 2022-08-23 (×5): 200 mg via ORAL
  Filled 2022-08-20 (×6): qty 1

## 2022-08-20 MED ORDER — LACTATED RINGERS IV SOLN: INTRAVENOUS | Status: DC

## 2022-08-20 MED ORDER — GERHARDT'S BUTT CREAM
TOPICAL_CREAM | Freq: Three times a day (TID) | CUTANEOUS | Status: DC
  Filled 2022-08-20: qty 1

## 2022-08-20 MED ORDER — ONDANSETRON HCL 4 MG/2ML IJ SOLN
4.0000 mg | Freq: Four times a day (QID) | INTRAMUSCULAR | Status: DC | PRN
  Administered 2022-08-20: 4 mg via INTRAVENOUS
  Filled 2022-08-20: qty 2

## 2022-08-20 NOTE — Progress Notes (Signed)
Subjective:  Patient is complaining of abdominal tenderness, with large loose bowel movement last night and another 1 this morning. Fevers still ongoing   Antibiotics:  Anti-infectives (From admission, onward)    Start     Dose/Rate Route Frequency Ordered Stop   08/19/22 1300  fidaxomicin (DIFICID) tablet 200 mg  Status:  Discontinued        200 mg Oral 2 times daily 08/19/22 0245 08/19/22 0337   08/19/22 0400  fidaxomicin (DIFICID) tablet 200 mg  Status:  Discontinued        200 mg Oral 2 times daily 08/19/22 0338 08/19/22 1504   08/18/22 2200  fidaxomicin (DIFICID) tablet 200 mg        200 mg Oral  Once 08/18/22 1909 08/19/22 0341       Medications: Scheduled Meds:  carvedilol  3.125 mg Oral BID WC   enoxaparin (LOVENOX) injection  30 mg Subcutaneous Q24H   levothyroxine  75 mcg Oral Q0600   Continuous Infusions: PRN Meds:.acetaminophen, mouth rinse, traMADol    Objective: Weight change:   Intake/Output Summary (Last 24 hours) at 08/20/2022 1104 Last data filed at 08/20/2022 0600 Gross per 24 hour  Intake 654.3 ml  Output --  Net 654.3 ml   Blood pressure 136/62, pulse 87, temperature 98.2 F (36.8 C), temperature source Oral, resp. rate 19, height 5' (1.524 m), weight 46.7 kg, SpO2 97 %. Temp:  [98.2 F (36.8 C)-101 F (38.3 C)] 98.2 F (36.8 C) (07/03 0349) Pulse Rate:  [79-96] 87 (07/03 0349) Resp:  [18-20] 19 (07/03 0349) BP: (124-136)/(57-62) 136/62 (07/03 0349) SpO2:  [96 %-98 %] 97 % (07/03 0349)  Physical Exam: Physical Exam Constitutional:      Appearance: She is well-developed. She is not diaphoretic.  HENT:     Head: Normocephalic and atraumatic.     Right Ear: External ear normal.     Left Ear: External ear normal.     Mouth/Throat:     Pharynx: No oropharyngeal exudate.  Eyes:     General: No scleral icterus.    Conjunctiva/sclera: Conjunctivae normal.     Pupils: Pupils are equal, round, and reactive to light.   Cardiovascular:     Rate and Rhythm: Normal rate and regular rhythm.     Heart sounds: Murmur heard.     No friction rub. No gallop.  Pulmonary:     Effort: Pulmonary effort is normal. No respiratory distress.     Breath sounds: Normal breath sounds. No wheezing.  Abdominal:     General: Bowel sounds are normal. There is no distension.     Palpations: Abdomen is soft.     Tenderness: There is abdominal tenderness.  Musculoskeletal:        General: No tenderness. Normal range of motion.  Lymphadenopathy:     Cervical: No cervical adenopathy.  Skin:    General: Skin is warm and dry.     Coloration: Skin is pale.  Neurological:     General: No focal deficit present.     Mental Status: She is alert and oriented to person, place, and time.     Motor: No abnormal muscle tone.  Psychiatric:        Mood and Affect: Mood normal.        Behavior: Behavior normal.        Thought Content: Thought content normal.        Judgment: Judgment normal.      CBC:  BMET Recent Labs    08/19/22 0711 08/20/22 0416  NA 128* 129*  K 3.7 3.5  CL 95* 96*  CO2 25 24  GLUCOSE 105* 99  BUN 17 14  CREATININE 1.01* 1.16*  CALCIUM 7.7* 7.6*     Liver Panel  Recent Labs    08/18/22 1552 08/19/22 0711  PROT 6.0* 5.6*  ALBUMIN 3.0* 2.8*  AST 21 17  ALT 15 15  ALKPHOS 63 73  BILITOT 0.4 0.2*       Sedimentation Rate No results for input(s): "ESRSEDRATE" in the last 72 hours. C-Reactive Protein No results for input(s): "CRP" in the last 72 hours.  Micro Results: Recent Results (from the past 720 hour(s))  Gastrointestinal Panel by PCR , Stool     Status: None   Collection Time: 08/18/22  3:19 PM   Specimen: Urine, Clean Catch; Stool  Result Value Ref Range Status   Campylobacter species NOT DETECTED NOT DETECTED Final   Plesimonas shigelloides NOT DETECTED NOT DETECTED Final   Salmonella species NOT DETECTED NOT DETECTED Final   Yersinia enterocolitica NOT DETECTED NOT  DETECTED Final   Vibrio species NOT DETECTED NOT DETECTED Final   Vibrio cholerae NOT DETECTED NOT DETECTED Final   Enteroaggregative E coli (EAEC) NOT DETECTED NOT DETECTED Final   Enteropathogenic E coli (EPEC) NOT DETECTED NOT DETECTED Final   Enterotoxigenic E coli (ETEC) NOT DETECTED NOT DETECTED Final   Shiga like toxin producing E coli (STEC) NOT DETECTED NOT DETECTED Final   Shigella/Enteroinvasive E coli (EIEC) NOT DETECTED NOT DETECTED Final   Cryptosporidium NOT DETECTED NOT DETECTED Final   Cyclospora cayetanensis NOT DETECTED NOT DETECTED Final   Entamoeba histolytica NOT DETECTED NOT DETECTED Final   Giardia lamblia NOT DETECTED NOT DETECTED Final   Adenovirus F40/41 NOT DETECTED NOT DETECTED Final   Astrovirus NOT DETECTED NOT DETECTED Final   Norovirus GI/GII NOT DETECTED NOT DETECTED Final   Rotavirus A NOT DETECTED NOT DETECTED Final   Sapovirus (I, II, IV, and V) NOT DETECTED NOT DETECTED Final    Comment: Performed at [facility], [address] [place], [place] [address]  Resp panel by RT-PCR (RSV, Flu A&B, Covid) Anterior Nasal Swab     Status: None   Collection Time: 08/18/22  3:53 PM   Specimen: Anterior Nasal Swab  Result Value Ref Range Status   SARS Coronavirus 2 by RT PCR NEGATIVE NEGATIVE Final    Comment: (NOTE) SARS-CoV-2 target nucleic acids are NOT DETECTED.  The SARS-CoV-2 RNA is generally detectable in upper respiratory specimens during the acute phase of infection. The lowest concentration of SARS-CoV-2 viral copies this assay can detect is 138 copies/mL. A negative result does not preclude SARS-Cov-2 infection and should not be used as the sole basis for treatment or other patient management decisions. A negative result may occur with  improper specimen collection/handling, submission of specimen other than nasopharyngeal swab, presence of viral mutation(s) within the areas targeted by this assay, and inadequate number of  viral copies(<138 copies/mL). A negative result must be combined with clinical observations, patient history, and epidemiological information. The expected result is Negative.  Fact Sheet for Patients:  BloggerCourse.com  Fact Sheet for Healthcare Providers:  SeriousBroker.it  This test is no t yet approved or cleared by the [place] FDA and  has been authorized for detection and/or diagnosis of SARS-CoV-2 by FDA under an Emergency Use Authorization (EUA). This EUA will remain  in effect (meaning this test can be  used) for the duration of the COVID-19 declaration under Section 564(b)(1) of the Act, 21 U.S.C.section 360bbb-3(b)(1), unless the authorization is terminated  or revoked sooner.       Influenza A by PCR NEGATIVE NEGATIVE Final   Influenza B by PCR NEGATIVE NEGATIVE Final    Comment: (NOTE) The Xpert Xpress SARS-CoV-2/FLU/RSV plus assay is intended as an aid in the diagnosis of influenza from Nasopharyngeal swab specimens and should not be used as a sole basis for treatment. Nasal washings and aspirates are unacceptable for Xpert Xpress SARS-CoV-2/FLU/RSV testing.  Fact Sheet for Patients: BloggerCourse.com  Fact Sheet for Healthcare Providers: SeriousBroker.it  This test is not yet approved or cleared by the [place] FDA and has been authorized for detection and/or diagnosis of SARS-CoV-2 by FDA under an Emergency Use Authorization (EUA). This EUA will remain in effect (meaning this test can be used) for the duration of the COVID-19 declaration under Section 564(b)(1) of the Act, 21 U.S.C. section 360bbb-3(b)(1), unless the authorization is terminated or revoked.     Resp Syncytial Virus by PCR NEGATIVE NEGATIVE Final    Comment: (NOTE) Fact Sheet for Patients: BloggerCourse.com  Fact Sheet for Healthcare  Providers: SeriousBroker.it  This test is not yet approved or cleared by the [place] FDA and has been authorized for detection and/or diagnosis of SARS-CoV-2 by FDA under an Emergency Use Authorization (EUA). This EUA will remain in effect (meaning this test can be used) for the duration of the COVID-19 declaration under Section 564(b)(1) of the Act, 21 U.S.C. section 360bbb-3(b)(1), unless the authorization is terminated or revoked.  Performed at [facility], [address] [place], [place] [address]   Blood Culture (routine x 2)     Status: None (Preliminary result)   Collection Time: 08/18/22  4:05 PM   Specimen: BLOOD  Result Value Ref Range Status   Specimen Description   Final    BLOOD LEFT ANTECUBITAL Performed at [facility], [address] [place], [place] [address]    Special Requests   Final    Blood Culture adequate volume BOTTLES DRAWN AEROBIC AND ANAEROBIC Performed at [facility], [address] [place], [place] [address]    Culture   Final    NO GROWTH 2 DAYS Performed at [facility], [address], [place], [place] [address]    Report Status PENDING  Incomplete  Blood Culture (routine x 2)     Status: None (Preliminary result)   Collection Time: 08/18/22  4:10 PM   Specimen: BLOOD  Result Value Ref Range Status   Specimen Description   Final    BLOOD BLOOD LEFT ARM Performed at [facility], [address] [place], [place] [address]    Special Requests   Final    Blood Culture adequate volume BOTTLES DRAWN AEROBIC AND ANAEROBIC Performed at [facility], [address] [place], [place] [address]    Culture   Final    NO GROWTH 2 DAYS Performed at [facility], [address] [place], [place] [address]    Report Status PENDING  Incomplete  C Difficile Quick Screen w PCR reflex     Status: None   Collection Time: 08/18/22  6:12 PM   Specimen:  Urine, Clean Catch; Stool  Result Value Ref Range Status   C Diff antigen NEGATIVE NEGATIVE Final   C Diff toxin NEGATIVE NEGATIVE Final  C Diff interpretation No C. difficile detected.  Final    Comment: Performed at [facility], [address] [place], [place] [address]    Studies/Results: CT CHEST W CONTRAST  Result Date: 08/19/2022 CLINICAL DATA:  Fever of unknown origin.  History of lung cancer EXAM: CT CHEST WITH CONTRAST TECHNIQUE: Multidetector CT imaging of the chest was performed during intravenous contrast administration. RADIATION DOSE REDUCTION: This exam was performed according to the departmental dose-optimization program which includes automated exposure control, adjustment of the mA and/or kV according to patient size and/or use of iterative reconstruction technique. CONTRAST:  75mL OMNIPAQUE IOHEXOL 300 MG/ML  SOLN COMPARISON:  Chest radiograph dated 08/18/2022. CT chest dated 07/21/2022 FINDINGS: Cardiovascular: The heart is normal in size. No pericardial effusion. No evidence of thoracic aortic aneurysm. Atherosclerotic calcifications of the aortic arch. Mediastinum/Nodes: No suspicious mediastinal lymphadenopathy. Visualized thyroid is unremarkable. Lungs/Pleura: Status post right upper lobectomy. Numerous bilateral pulmonary nodules/metastases, including: --18 mm subpleural nodule in the lateral right lower lobe (series 4/image 49), previously 17 mm --13 mm nodule in the posteromedial right lower lobe (series 8/image 99), previously 14 mm --14 mm nodule in the posterior left lower lobe (series 8/image 110), unchanged Overall, these are considered unchanged. Small right pleural effusion, progressive. Trace left pleural effusion, new. No focal consolidation. No pneumothorax. Upper Abdomen: Visualized upper abdomen is notable for wall thickening and mild pericolonic inflammatory changes involving the left colon (series 2/image 159), corresponding to the patient's known  infectious/inflammatory colitis. 9 mm hypoenhancing lesion in the posterior right liver (series 2/image 126), indeterminate. Moderate hiatal hernia. Cholelithiasis, without associated inflammatory changes. Vascular calcifications. Musculoskeletal: Mild degenerative changes of the upper lumbar spine. IMPRESSION: Status post right upper lobectomy. Numerous bilateral pulmonary nodules/metastases, unchanged. Small right pleural effusion, progressive. Trace left pleural effusion, new. Additional ancillary findings as above. Aortic Atherosclerosis (ICD10-I70.0). Electronically Signed   By: Charline Bills M.D.   On: 08/19/2022 23:29   MR BRAIN WO CONTRAST  Result Date: 08/18/2022 CLINICAL DATA:  Acute neurologic deficit EXAM: MRI HEAD WITHOUT CONTRAST TECHNIQUE: Multiplanar, multiecho pulse sequences of the brain and surrounding structures were obtained without intravenous contrast. COMPARISON:  None Available. FINDINGS: Brain: No acute infarct, mass effect or extra-axial collection. No acute or chronic hemorrhage. There is multifocal hyperintense T2-weighted signal within the white matter. Parenchymal volume and CSF spaces are normal. The midline structures are normal. Vascular: Major flow voids are preserved. Skull and upper cervical spine: Normal calvarium and skull base. Visualized upper cervical spine and soft tissues are normal. Sinuses/Orbits:No paranasal sinus fluid levels or advanced mucosal thickening. No mastoid or middle ear effusion. Normal orbits. IMPRESSION: 1. No acute intracranial abnormality. 2. Findings of chronic small vessel ischemia. Electronically Signed   By: Deatra Robinson M.D.   On: 08/18/2022 23:09   CT ABDOMEN PELVIS W CONTRAST  Result Date: 08/18/2022 CLINICAL DATA:  Abdominal pain with diarrhea and fever for 3 days. History of C difficile colitis. EXAM: CT ABDOMEN AND PELVIS WITH CONTRAST TECHNIQUE: Multidetector CT imaging of the abdomen and pelvis was performed using the standard  protocol following bolus administration of intravenous contrast. RADIATION DOSE REDUCTION: This exam was performed according to the departmental dose-optimization program which includes automated exposure control, adjustment of the mA and/or kV according to patient size and/or use of iterative reconstruction technique. CONTRAST:  80mL OMNIPAQUE IOHEXOL 300 MG/ML  SOLN COMPARISON:  Abdominopelvic CT 11/01/2021.  Chest CT 07/21/2022. FINDINGS: Lower chest: Again demonstrated are multiple irregular nodules at both  lung bases which have enlarged from the abdominal CT of 11/01/2021 but are similar to the more recent chest CT of last month. These nodules remain consistent with metastatic lung cancer (no non-small cell lung cancer). Representative lesions include a medial right lower lobe nodule measuring 1.5 x 0.9 cm on image 4/3 and a left lower lobe nodule measuring 1.0 cm on image 7/3. Moderate size hiatal hernia. Hepatobiliary: The liver is normal in density without suspicious focal abnormality. A small low-density lesion peripherally in the right hepatic lobe on image 14/2 is stable, consistent with a benign finding. Stable mild periportal edema. No evidence of gallstones, gallbladder wall thickening or biliary dilatation. Pancreas: Unremarkable. No pancreatic ductal dilatation or surrounding inflammatory changes. Spleen: Normal in size without focal abnormality. Adrenals/Urinary Tract: Both adrenal glands appear normal. No evidence of urinary tract calculus, suspicious renal lesion or hydronephrosis. Probable extrarenal pelves bilaterally without evidence of ureteral obstruction. The bladder appears unremarkable for its degree of distention. Stomach/Bowel: No enteric contrast administered. As above, moderate-size hiatal hernia. The stomach otherwise appears unremarkable for its degree of distention. No small bowel distension, wall thickening or surrounding inflammation identified. There is some fecalization of the  distal small bowel. There is mild colonic wall thickening extending from the cecum into the descending colon, consistent with colitis. Vascular/Lymphatic: There are no enlarged abdominal or pelvic lymph nodes. Aortic and branch vessel atherosclerosis without evidence of aneurysm or large vessel occlusion. Reproductive: Previous hysterectomy.  No evidence of adnexal mass. Other: No ascites, peritoneal nodularity or pneumoperitoneum. Musculoskeletal: Previous right total hip arthroplasty. Chronic incompletely healed fractures of the right superior and inferior pubic rami. Severe multilevel spondylosis associated with a convex right thoracolumbar scoliosis. There are chronic bilateral L5 pars defects which contribute to a chronic anterolisthesis and biforaminal narrowing at L5-S1. No acute osseous findings are seen. Unless specific follow-up recommendations are mentioned in the findings or impression sections, no imaging follow-up of any mentioned incidental findings is recommended. IMPRESSION: 1. Mild colonic wall thickening extending from the cecum into the descending colon, consistent with colitis. No evidence of bowel obstruction, perforation or abscess. 2. No other acute abdominopelvic findings. 3. Known metastatic lung cancer at both lung bases, similar to recent chest CT of last month. 4. Moderate-size hiatal hernia. 5. Chronic incompletely healed fractures of the right superior and inferior pubic rami. Chronic bilateral L5 pars defects with associated anterolisthesis and biforaminal narrowing at L5-S1. 6.  Aortic Atherosclerosis (ICD10-I70.0). Electronically Signed   By: Carey Bullocks M.D.   On: 08/18/2022 18:04   DG Chest Port 1 View  Result Date: 08/18/2022 CLINICAL DATA:  Fever, diarrhea.  Sepsis EXAM: PORTABLE CHEST 1 VIEW COMPARISON:  X-ray 01/02/2022 FINDINGS: Borderline enlarged cardiopericardial silhouette with a calcified aorta. Apical pleural thickening on the right-greater-than-left. There is  some scarring and fibrotic changes along the medial right lung base. No pneumothorax, effusion or consolidation. There are some subtle areas of nodularity in the left mid midlung. Please correlate with CT scan of 07/21/2022 describing multiple lung nodules. Patient has a history of non-small-cell lung cancer. Overlapping cardiac leads. Diffuse chronic lung changes. Surgical clips in the medial left upper thorax. Osteopenia. Degenerative changes of the shoulders and spine IMPRESSION: Enlarged heart with chronic lung changes. Areas of scarring and fibrotic changes. Multiple lung nodules as seen on prior CT of 07/21/2018. No consolidation or effusion. Electronically Signed   By: Karen Kays M.D.   On: 08/18/2022 18:00   CT Head Wo Contrast  Result Date: 08/18/2022  CLINICAL DATA:  Neuro deficit, acute, stroke suspected EXAM: CT HEAD WITHOUT CONTRAST TECHNIQUE: Contiguous axial images were obtained from the base of the skull through the vertex without intravenous contrast. RADIATION DOSE REDUCTION: This exam was performed according to the departmental dose-optimization program which includes automated exposure control, adjustment of the mA and/or kV according to patient size and/or use of iterative reconstruction technique. COMPARISON:  MRI head August 01, 2013. FINDINGS: Brain: No evidence of acute infarction, hemorrhage, hydrocephalus, extra-axial collection or mass lesion/mass effect. Patchy white matter hypodensities, nonspecific but compatible with chronic microvascular ischemic disease. Cerebral atrophy. Vascular: No hyperdense vessel. Skull: No acute fracture. Sinuses/Orbits: Clear sinuses.  No acute orbital findings. Other: No mastoid effusions. IMPRESSION: Senescent changes without evidence of acute abnormality. Electronically Signed   By: Feliberto Harts M.D.   On: 08/18/2022 17:59      Assessment/Plan:  INTERVAL HISTORY: CT of lungs complete   Principal Problem:   Colitis    REBELLE SILVEY  is a 89 y.o. female with A past medical history including NSCLC, seronegative RA, having been on Orencia, multiple other autoimmune conditions, IBS Solik and diastolic CHF, severe aortic valve regurgitation who was diagnosed in March with C difficile coltis by PCR but not toxin/antigen assay but had a story that sounded fairly convincing for c difficile response to vancomycinlater diagnosed again in April by PCR testing and treated Vancomycin for few days followed by Dificid and then with another recurrence in June this time actually with toxin antigen positivity (we had a great deal of difficulty locating this test but it is in epic) with response to Dificid but then with recurrence of symptoms.  My partner Dr. Drue Second to try to get her Dificid but the patient could not find today pharmacy and ended up presenting to med [facility].  She had some more disconcerting symptoms including low blood pressure and a transient episode where she could not move her right arm.  She has been admitted with to [facility]  CT showed ? Colitis but her C difficile antigen/toxin assay was negative and yesterday she was having formed bowel movement and nto having frequent bowel movements   She has now had a few loose BM again  Her WBC has climbed and she remains febrile  I have obtained a CT chest to look for other causes of her fever but it showed liver known metastatic lung cancer and bilateral small pleural effusions.  TTE shows her known severe AVR  Blood cultures negative  I am quite perplexed here with diagnostic tests not lining up with her presumptive initial diagnosis of C difficile colitis  I will repeat her C difficile toxin/antigen test today  Could she have a non c diff colitis--that would seem unsual  It may be prudent to have GI come and evaluate the patient  Perhaps a flex sigmoidoscopy could help clarify the diagnosis  I am not opposed to resuming her fidaxomicin but would like to get  repeat C diff testing done first  She has a story with LOTS of confounders including IBS, imodium use on top of a true diagnosis of C difficile in June that cannot be refuted.  I have personally spent 52 minutes involved in face-to-face and non-face-to-face activities for this patient on the day of the visit. Professional time spent includes the following activities: Preparing to see the patient (review of tests), Obtaining and/or reviewing separately obtained history (admission/discharge record), Performing a medically appropriate examination and/or evaluation , Ordering  medications/tests/procedures, referring and communicating with other health care professionals, Documenting clinical information in the EMR, Independently interpreting results (not separately reported), Communicating results to the patient/family/caregiver, Counseling and educating the patient/family/caregiver and Care coordination (not separately reported).   I am on call over the Holiday Weekend thru Sunday for health care system but certainly plan on following her closely.         LOS: 2 days   Acey Lav 08/20/2022, 11:04 AM

## 2022-08-20 NOTE — Consult Note (Signed)
[facility] Consult Note   History [name] MRN # [id number]  Date of Admission: 08/18/2022 Date of Consultation: 08/20/2022 Referring physician: Dr. Rhetta Mura, MD Primary Care Provider: Pearline Cables, MD Primary Gastroenterologist: Dr. Doristine Locks   Reason for Consultation/Chief Complaint: C. difficile, abdominal pain and diarrhea  Subjective  HPI:  This is a delightful 89 year old woman well-known to my partner Dr. Barron Alvine as well as the infectious disease service for recurrent C. difficile in the last several months.  Details of those tests and treatment are clearly outlined in the most recent office note from Dr. Barron Alvine as well as inpatient consult and follow-up notes by Dr. Daiva Eves of the infectious disease service.  We are asked to evaluate her for abdominal pain diarrhea and question of colitis.  She has had recurrent antibiotic treatments for C. difficile and was admitted with recurrent fever abdominal pain and diarrhea.  Imaging suggest the possibility of a diffuse colitis.  It was felt this was perhaps C. difficile and/or some other infectious source causing fever and leukocytosis, but her C. difficile toxin and antigen 2 days ago were negative.  Therefore she received 1 or 2 doses of fidaxomicin and then it was discontinued after the negative testing.  Infectious disease consultants are therefore planning additional C. difficile testing and inquired about the possibility of sigmoidoscopy or colonoscopy for further evaluation.  She has primarily left-sided crampy abdominal pain in the urgent need for a loose BM almost immediately after eating.  No rectal bleeding.  Appetite has been generally decreased in recent weeks.  Denies chest pain cough or dyspnea.  ROS:  Constitutional: Generalized fatigue Arthralgias from inflammatory arthritis Remainder systems negative except as above  All other systems are negative except as noted above in  the HPI  Past Medical History Past Medical History:  Diagnosis Date   Arthralgia of multiple joints    followed by dr Sharmon Revere   Arthritis    Cardiomyopathy (HCC)    Chronic constipation    Chronic inflammatory arthritis    rhemotolgist-  dr a. Sharmon Revere ([facility])   Dry eyes    eye drops used    GERD (gastroesophageal reflux disease)    H/O discoid lupus erythematosus    Hiatal hernia    History of colon polyps    Hypothyroidism    Iron deficiency anemia    LBBB (left bundle branch block) 2010   Mitchell's disease (erythromelalgia) [facility])    neurologist-  dr patel   Nocturia    Non-small cell cancer of right lung [facility]) surgeon-- dr gerhardt/  oncologist-  dr Arbutus Ped--- per lov notes no recurrence/   11-18-2017 per pt denies any symptoms   dx 2015--  Stage IIA (T2b,N0,M0) , +EGFR  mutation in exon 21, non-small cell adenocarcinoma right upper lobe---  s/p  Right upper lobectomy , right middley wedge resection and node dissection---  no chemo or radiation therapy   OA (osteoarthritis)    hands   Osteoporosis    PONV (postoperative nausea and vomiting)    likes phenergan   Raynaud's phenomenon 1965   Renal insufficiency    Rheumatoid arthritis (HCC)    Sciatica    Scoliosis    Sjogren's syndrome (HCC)     Past Surgical History Past Surgical History:  Procedure Laterality Date   ANTERIOR HIP REVISION Right 11/27/2017   Procedure: RIGHT HIP ACETABULAR REVISION;  Surgeon: Kathryne Hitch, MD;  Location: [facility] ORS;  Service: Orthopedics;  Laterality: Right;  ANTERIOR HIP REVISION Right 01/24/2018   Procedure: OPEN REDUCTION OF DISLOCATED ANTERIOR HIP WITH REVISION OF LINER AND HIP BALL;  Surgeon: Kathryne Hitch, MD;  Location: [facility];  Service: Orthopedics;  Laterality: Right;   APPENDECTOMY  1950s   BIOPSY  04/14/2018   Procedure: BIOPSY;  Surgeon: Benancio Deeds, MD;  Location: [facility] ENDOSCOPY;  Service: Gastroenterology;;   BIOPSY  04/16/2018    Procedure: BIOPSY;  Surgeon: Lemar Lofty., MD;  Location: [facility] ENDOSCOPY;  Service: Gastroenterology;;   CARDIOVASCULAR STRESS TEST  12/2008    mild fixed basal to mid septal perfusion defect felt likely due to artifact from LBBB, no ischemia, EF 58%   COLONOSCOPY     COLONOSCOPY WITH PROPOFOL N/A 04/16/2018   Procedure: COLONOSCOPY WITH PROPOFOL;  Surgeon: Lemar Lofty., MD;  Location: [facility] ENDOSCOPY;  Service: Gastroenterology;  Laterality: N/A;   ESOPHAGOGASTRODUODENOSCOPY (EGD) WITH PROPOFOL N/A 04/14/2018   Procedure: ESOPHAGOGASTRODUODENOSCOPY (EGD) WITH PROPOFOL;  Surgeon: Benancio Deeds, MD;  Location: [facility] ENDOSCOPY;  Service: Gastroenterology;  Laterality: N/A;   FEMORAL-POPLITEAL BYPASS GRAFT Right 04/10/2018   Procedure: REPAIR RIGHT FEMORAL ARTERY PSEUDOANEURYSM, RETROPERITONEAL EXPOSURE OF ILIAC ARTERY, RIGHT POPLITEAL EMBOLECTOMY;  Surgeon: Chuck Hint, MD;  Location: [facility] OR;  Service: Vascular;  Laterality: Right;   FLEXIBLE SIGMOIDOSCOPY N/A 06/17/2019   Procedure: FLEXIBLE SIGMOIDOSCOPY;  Surgeon: Shellia Cleverly, DO;  Location: [facility] ENDOSCOPY;  Service: Gastroenterology;  Laterality: N/A;   HEMOSTASIS CLIP PLACEMENT  06/17/2019   Procedure: HEMOSTASIS CLIP PLACEMENT;  Surgeon: Shellia Cleverly, DO;  Location: [facility] ENDOSCOPY;  Service: Gastroenterology;;   LYMPH NODE DISSECTION Right 06/07/2013   Procedure: LYMPH NODE DISSECTION;  Surgeon: Delight Ovens, MD;  Location: [facility] OR;  Service: Thoracic;  Laterality: Right;   PATCH ANGIOPLASTY Right 04/10/2018   Procedure: PATCH  ANGIOPLASTY OF RIGHT FEMORAL ARTERY USING BOVINE PATCH, PATCH ANGIOPLASTY OF RIGHT POPLITEAL ARTERY USING BOVINE PATCH;  Surgeon: Chuck Hint, MD;  Location: [facility] OR;  Service: Vascular;  Laterality: Right;   SCHLEROTHERAPY  06/17/2019   Procedure: Theresia Majors OF VARICES;  Surgeon: Shellia Cleverly, DO;  Location: [facility] ENDOSCOPY;  Service: Gastroenterology;;   THORACIC  SYMPATHETECTOMY  1965   "large incision from chest to up to shoulder, the nerves were tied together, for raynaud's   THORACOTOMY  06/07/2013   Procedure: MINI/LIMITED THORACOTOMY; right middle lobe wedge resection;  Surgeon: Delight Ovens, MD;  Location[facility] OR;  Service: Thoracic;;   TONSILLECTOMY  child   TOTAL ABDOMINAL HYSTERECTOMY  1980's    W/ BSO   TOTAL HIP ARTHROPLASTY Right 04/28/2014   Procedure: RIGHT TOTAL HIP ARTHROPLASTY ANTERIOR APPROACH;  Surgeon: Kathryne Hitch, MD;  Location: [facility]S;  Service: Orthopedics;  Laterality: Right;   TRANSTHORACIC ECHOCARDIOGRAM  12/11/2008   ef 45-50%, grade 1 diastolic dysfunction/  mild LAE/  mild AR and MR/  trivial TR   VIDEO ASSISTED THORACOSCOPY (VATS)/WEDGE RESECTION Right 06/07/2013   Procedure: VIDEO ASSISTED THORACOSCOPY (VATS)/right upper lobectomy, On Q;  Surgeon: Delight Ovens, MD;  Location: [facility] OR;  Service: Thoracic;  Laterality: Right;   VIDEO BRONCHOSCOPY N/A 06/07/2013   Procedure: VIDEO BRONCHOSCOPY;  Surgeon: Delight Ovens, MD;  Location: [facility] OR;  Service: Thoracic;  Laterality: N/A;   VIDEO BRONCHOSCOPY WITH ENDOBRONCHIAL NAVIGATION N/A 05/04/2013   Procedure: VIDEO BRONCHOSCOPY WITH ENDOBRONCHIAL NAVIGATION;  Surgeon: Delight Ovens, MD;  Location: [facility] OR;  Service: Thoracic;  Laterality: N/A;    Family History Family History  Problem Relation Age of Onset   Coronary artery disease Father    Colon cancer Father    Diabetes Father    Cancer Father        colon   Other Mother 51       MVA   Healthy Sister    Healthy Brother    Healthy Daughter    Hypothyroidism Daughter    Other Brother        killed in war   Pneumonia Sister    Healthy Daughter    Esophageal cancer Neg Hx    Kidney disease Neg Hx    Liver disease Neg Hx     Social History Social History   Socioeconomic History   Marital status: Widowed    Spouse name: Not on file   Number of children: 2   Years of education: Not on  file   Highest education level: Master's degree (e.g., MA, MS, MEng, MEd, MSW, MBA)  Occupational History   Occupation: n/a  Tobacco Use   Smoking status: Never    Passive exposure: Past   Smokeless tobacco: Never  Vaping Use   Vaping Use: Never used  Substance and Sexual Activity   Alcohol use: Not Currently   Drug use: Never   Sexual activity: Not Currently    Birth control/protection: Surgical  Other Topics Concern   Not on file  Social History Narrative   Lives with husband, daughter and grandchild local.   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   Social Determinants of Health   Financial Resource Strain: Low Risk  (08/13/2022)   Overall Financial Resource Strain (CARDIA)    Difficulty of Paying Living Expenses: Not hard at all  Food Insecurity: No Food Insecurity (08/19/2022)   Hunger Vital Sign    Worried About Running Out of Food in the Last Year: Never true    Ran Out of Food in the Last Year: Never true  Transportation Needs: No Transportation Needs (08/19/2022)   PRAPARE - Administrator, Civil Service (Medical): No    Lack of Transportation (Non-Medical): No  Physical Activity: Unknown (08/13/2022)   Exercise Vital Sign    Days of Exercise per Week: Patient declined    Minutes of Exercise per Session: Not on file  Stress: Patient Declined (08/13/2022)   [employer] of Occupational Health - Occupational Stress Questionnaire    Feeling of Stress : Patient declined  Social Connections: Moderately Integrated (08/13/2022)   Social Connection and Isolation Panel [NHANES]    Frequency of Communication with Friends and Family: More than three times a week    Frequency of Social Gatherings with Friends and Family: More than three times a week    Attends Religious Services: More than 4 times per year    Active Member of [employer] or Organizations: Yes    Attends Banker Meetings: More than 4 times per year    Marital  Status: Widowed    Allergies Allergies  Allergen Reactions   Amlodipine Rash   Prochlorperazine Edisylate Anaphylaxis    Compazine--- tongue swells and rash   Aspirin Other (See Comments)    Nose bleeds. Cannot take NSAIDS    Cymbalta [Duloxetine Hcl] Diarrhea, Nausea And Vomiting and Other (See Comments)    Increased blood pressure   Pamelor [Nortriptyline Hcl] Diarrhea and Nausea Only    Increased Heart rate and BP    Outpatient Meds Home medications from the H+P and/or nursing med  reconciliation reviewed.  Inpatient med list reviewed  _____________________________________________________________________ Objective   Exam:  Current vital signs  Patient Vitals for the past 8 hrs:  BP Temp Temp src Pulse Resp SpO2  08/20/22 1400 139/69 98.6 F (37 C) Oral 76 (!) 24 97 %    Intake/Output Summary (Last 24 hours) at 08/20/2022 1541 Last data filed at 08/20/2022 1200 Gross per 24 hour  Intake 1374.3 ml  Output --  Net 1374.3 ml    Physical Exam:   General: this is a pleasant and frail elderly female patient in no acute distress.  Her daughter is at the bedside. Eyes: sclera anicteric, no redness ENT: oral mucosa moist without lesions, no cervical or supraclavicular lymphadenopathy CV: RRR , systolic murmur heard best at the apex, no JVD,, no peripheral edema Resp: clear to auscultation bilaterally, normal RR and effort noted GI: soft, left-sided tenderness, with active bowel sounds. No guarding or palpable organomegaly noted Skin; warm and dry, no rash or jaundice noted Neuro: awake, alert and oriented x 3. Normal gross motor function and fluent speech.  Labs:     Latest Ref Rng & Units 08/20/2022   12:44 PM 08/20/2022    4:16 AM 08/19/2022    7:11 AM  CBC  WBC 4.0 - 10.5 K/uL 24.6  22.4  13.9   Hemoglobin 12.0 - 15.0 g/dL 9.8  8.9  9.6   Hematocrit 36.0 - 46.0 % 31.6  28.8  30.9   Platelets 150 - 400 K/uL 239  196  203        Latest Ref Rng & Units 08/20/2022     4:16 AM 08/19/2022    7:11 AM 08/18/2022    3:52 PM  CMP  Glucose 70 - 99 mg/dL 99  621  308   BUN 8 - 23 mg/dL 14  17  26    Creatinine 0.44 - 1.00 mg/dL 6.57  8.46  9.62   Sodium 135 - 145 mmol/L 129  128  126   Potassium 3.5 - 5.1 mmol/L 3.5  3.7  3.9   Chloride 98 - 111 mmol/L 96  95  94   CO2 22 - 32 mmol/L 24  25  24    Calcium 8.9 - 10.3 mg/dL 7.6  7.7  7.7   Total Protein 6.5 - 8.1 g/dL  5.6  6.0   Total Bilirubin 0.3 - 1.2 mg/dL  0.2  0.4   Alkaline Phos 38 - 126 U/L  73  63   AST 15 - 41 U/L  17  21   ALT 0 - 44 U/L  15  15     Recent Labs  Lab 08/18/22 1553  INR 1.0   _________________________________________________________ Radiologic studies:  CLINICAL DATA:  Abdominal pain with diarrhea and fever for 3 days. History of C difficile colitis.   EXAM: CT ABDOMEN AND PELVIS WITH CONTRAST   TECHNIQUE: Multidetector CT imaging of the abdomen and pelvis was performed using the standard protocol following bolus administration of intravenous contrast.   RADIATION DOSE REDUCTION: This exam was performed according to the departmental dose-optimization program which includes automated exposure control, adjustment of the mA and/or kV according to patient size and/or use of iterative reconstruction technique.   CONTRAST:  80mL OMNIPAQUE IOHEXOL 300 MG/ML  SOLN   COMPARISON:  Abdominopelvic CT 11/01/2021.  Chest CT 07/21/2022.   FINDINGS: Lower chest: Again demonstrated are multiple irregular nodules at both lung bases which have enlarged from the abdominal CT of 11/01/2021 but are  similar to the more recent chest CT of last month. These nodules remain consistent with metastatic lung cancer (no non-small cell lung cancer). Representative lesions include a medial right lower lobe nodule measuring 1.5 x 0.9 cm on image 4/3 and a left lower lobe nodule measuring 1.0 cm on image 7/3. Moderate size hiatal hernia.   Hepatobiliary: The liver is normal in density without  suspicious focal abnormality. A small low-density lesion peripherally in the right hepatic lobe on image 14/2 is stable, consistent with a benign finding. Stable mild periportal edema. No evidence of gallstones, gallbladder wall thickening or biliary dilatation.   Pancreas: Unremarkable. No pancreatic ductal dilatation or surrounding inflammatory changes.   Spleen: Normal in size without focal abnormality.   Adrenals/Urinary Tract: Both adrenal glands appear normal. No evidence of urinary tract calculus, suspicious renal lesion or hydronephrosis. Probable extrarenal pelves bilaterally without evidence of ureteral obstruction. The bladder appears unremarkable for its degree of distention.   Stomach/Bowel: No enteric contrast administered. As above, moderate-size hiatal hernia. The stomach otherwise appears unremarkable for its degree of distention. No small bowel distension, wall thickening or surrounding inflammation identified. There is some fecalization of the distal small bowel. There is mild colonic wall thickening extending from the cecum into the descending colon, consistent with colitis.   Vascular/Lymphatic: There are no enlarged abdominal or pelvic lymph nodes. Aortic and branch vessel atherosclerosis without evidence of aneurysm or large vessel occlusion.   Reproductive: Previous hysterectomy.  No evidence of adnexal mass.   Other: No ascites, peritoneal nodularity or pneumoperitoneum.   Musculoskeletal: Previous right total hip arthroplasty. Chronic incompletely healed fractures of the right superior and inferior pubic rami. Severe multilevel spondylosis associated with a convex right thoracolumbar scoliosis. There are chronic bilateral L5 pars defects which contribute to a chronic anterolisthesis and biforaminal narrowing at L5-S1. No acute osseous findings are seen.   Unless specific follow-up recommendations are mentioned in the findings or impression sections,  no imaging follow-up of any mentioned incidental findings is recommended.   IMPRESSION: 1. Mild colonic wall thickening extending from the cecum into the descending colon, consistent with colitis. No evidence of bowel obstruction, perforation or abscess. 2. No other acute abdominopelvic findings. 3. Known metastatic lung cancer at both lung bases, similar to recent chest CT of last month. 4. Moderate-size hiatal hernia. 5. Chronic incompletely healed fractures of the right superior and inferior pubic rami. Chronic bilateral L5 pars defects with associated anterolisthesis and biforaminal narrowing at L5-S1. 6.  Aortic Atherosclerosis (ICD10-I70.0).     Electronically Signed   By: Carey Bullocks M.D.   On: 08/18/2022 18:04   (Images personally reviewed -H Danis) ______________________________________________________ Other studies: C. difficile testing as noted above  GI pathogen panel negative on 08/18/2022    _______________________________________________________ Assessment & Plan  Impression:  89 year old woman with lung cancer and other medical issues as noted above here with fever abdominal pain and diarrhea in the setting of recurrent C. difficile in last several months.  She was having formed stools at home prior to admission but now recurrent diarrhea. I have personally reviewed the CT scan and, despite the lack of oral and IV contrast, it does appear to show significant diffuse colonic wall thickening consistent with a colitis.  Despite the negative C. difficile testing, I think this overall clinical picture favors an infectious colitis and that it is probably C. difficile.  I recommend treating as such with fidaxomicin and what ever other treatment our infectious disease consultants would deem appropriate.  It appears there was some consideration of Vowst microbiota treatment as an outpatient, but this was not pursued since it was during a period of time with the  patient's diarrhea had stopped. Fidaxomicin is a relatively low risk/low side effect medication to take and I favor giving it to her empirically if agreeable to her other physicians.  I consider it much less likely that this patient has a noninfectious colitis such as new onset UC.  A colonoscopy for her would unquestionably be an increased risk from the standpoint of sedation but also perforation in this frail 89 year old woman with multiple medical issues that include but are not limited to her heart condition.  Even if I were to perform it and see a colitis and even biopsy that, we would not likely be able to distinguish between infectious and noninfectious colitis since this is all been going on for months.    So at this point, I do not recommend sigmoidoscopy or colonoscopy.  Lastly, she describes her bottom being "raw", so I will prescribe some barrier cream so she does not get skin breakdown and decubitus ulcer.  No particular dietary restrictions necessary in my opinion.  No clear data to suggest a role for probiotic therapy in this scenario.  We will check back in 2 to 3 days, please call sooner if needed.   Thank you for the courtesy of this consult.  Please contact me with any questions or concerns.  Charlie Pitter III Office: [phone]

## 2022-08-20 NOTE — Progress Notes (Signed)
PROGRESS NOTE   [name]  ZOX:[id number] DOB: 16-Jun-1930 DOA: 08/18/2022 PCP: Pearline Cables, MD  Brief Narrative:   89 year old white female Prior thoracotomy right upper lobectomy for stage IIa NSCLC R UL lung cancer 2015 follows with Dr. Mackie Pai was previously on Tagrisso with 4 weeks of treatment and this was discontinued secondary to drug-induced pneumonitis LBBB--dilated cardiomyopathy-does have systolic diastolic heart failure followed by Dr. Jens Som Prior right hip repair by Dr. Magnus Ivan with dislocation and revision of liner and hip ball 01/24/2018 CKD 3 A HTN Large right femoral pseudoaneurysm CFA, right popliteal artery occlusion 04/17/2018 admission Symptomatic anemia with prior colonoscopy in 2023 polyps removed--- also grade 2 internal hemorrhoids--underwent urgent flex sig 06/2018 for bleeding rectal ulcer requiring hemostatic clips etc. Previous NSVT during hospitalization Discoid lupus and Sjogren syndrome--rheumatoid arthritis additionally follows with Dr. Corliss Skains   It appears she has previously had C. difficile in March 2024-she was prescribed vancomycin 125 4 times daily for 10 days on then developed urgency again on 06/16/2022 per GI note--it looks like she started taking Bactrim for UTI and then had a recurrence and was treated with Dificid  Patient went to see Dr. 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 Maurine Minister, Dr. Merlene Morse am resuming Dificid For academic  purposes obtain calprotectin, lactoferrin although this is probably C. difficile with a negative test  Discoid lupus Sjogren's sicca syndrome rheumatoid on abatacept every 28 days-follows with Dr. Corliss Skains ?usually these diseases do not cause diarrhea Would resume abatacept in the outpatient setting when able-has not been on this for several months because of recurrent infections Resume gabapentin 300 3 times daily at time of discharge-holding Norco 1 twice daily  RUL lung cancer 2015 previously Tagrisso discontinued secondary to pneumonitis--progressive cancer Dr. Shirline Frees to treatment team as courtesy-no current oncological requirements  HFmEF-echo 40-45% grade 2 diastolic dysfunction 12/24/2021 Continue fluids for now Continue Coreg 3.125 twice daily--- hold Lasix 40 daily for now  Normocytic anemia in the setting of prior rectal ulcer, polypectomies in 2023 No real source of bleeding  DVT prophylaxis: Lovenox Code Status: Presumed full Family Communication: None Disposition:  Status is: Inpatient Remains inpatient appropriate because:   Requires further management    Subjective: Awake coherent no distress seems comfortable but still having multiple stools She purposely does not wish to eat as she has diarrhea every time something touches her stomach and she has some pain She has not had any vomiting but feels nauseous  She has no chest pain  bjective: Vitals:   08/19/22 1317 08/19/22 1859 08/19/22 2143 08/20/22 0349  BP: (!) 129/57  (!) 124/57 136/62  Pulse: 96  79 87  Resp: 18  20 19   Temp: (!) 101 F (38.3 C) 98.6 F (37 C) 99.2 F (37.3 C) 98.2 F (36.8 C)  TempSrc: Oral Oral Oral Oral  SpO2: 96%  98% 97%  Weight:      Height:        Intake/Output Summary (Last 24 hours) at 08/20/2022 1215 Last data filed at 08/20/2022 0600 Gross per 24 hour  Intake 654.3 ml  Output --  Net  654.3 ml   Filed Weights   08/18/22 1454  Weight: 46.7 kg     Examination:  EOMI NCAT cachectic white female No icterus no pallor Neck soft supple Chest is clear no wheeze rales rhonchi S1-S2 no murmur Tender in right lower quadrant and left upper quadrant Rectal exam deferred No lower extremity edema Neuro intact   Data Reviewed: personally reviewed   CBC    Component Value Date/Time   WBC 22.4 (H) 08/20/2022 0416   RBC 3.19 (L) 08/20/2022 0416   HGB 8.9 (L) 08/20/2022 0416   HGB 11.4 (L) 04/22/2022 1259   HGB 11.0 (L) 08/21/2016 1125   HCT 28.8 (L) 08/20/2022 0416   HCT 33.6 (L) 08/21/2016 1125   PLT 196 08/20/2022 0416   PLT 205 04/22/2022 1259   PLT 160 08/21/2016 1125   PLT 150 09/22/2008 0000   MCV 90.3 08/20/2022 0416   MCV 88.9 08/21/2016 1125   MCH 27.9 08/20/2022 0416   MCHC 30.9 08/20/2022 0416   RDW 14.6 08/20/2022 0416   RDW 13.0 08/21/2016 1125   LYMPHSABS 1.4 07/21/2022 1012   LYMPHSABS 1.0 08/21/2016 1125   MONOABS 0.7 07/21/2022 1012   MONOABS 0.5 08/21/2016 1125   EOSABS 0.1 07/21/2022 1012   EOSABS 0.0 08/21/2016 1125   BASOSABS 0.1 07/21/2022 1012   BASOSABS 0.0 08/21/2016 1125      Latest Ref Rng & Units 08/20/2022    4:16 AM 08/19/2022    7:11 AM 08/18/2022    3:52 PM  CMP  Glucose 70 - 99 mg/dL 99  147  829   BUN 8 - 23 mg/dL 14  17  26    Creatinine 0.44 - 1.00 mg/dL 5.62  1.30  8.65   Sodium 135 - 145 mmol/L 129  128  126   Potassium 3.5 - 5.1 mmol/L 3.5  3.7  3.9   Chloride 98 - 111 mmol/L 96  95  94   CO2 22 - 32 mmol/L 24  25  24    Calcium 8.9 - 10.3 mg/dL 7.6  7.7  7.7   Total Protein 6.5 - 8.1 g/dL  5.6  6.0   Total Bilirubin 0.3 - 1.2 mg/dL  0.2  0.4   Alkaline Phos 38 - 126 U/L  73  63   AST 15 - 41 U/L  17  21   ALT 0 - 44 U/L  15  15      Radiology Studies: CT CHEST W CONTRAST  Result Date: 08/19/2022 CLINICAL DATA:  Fever of unknown origin.  History of lung cancer EXAM: CT CHEST WITH CONTRAST TECHNIQUE: Multidetector CT imaging of the chest was performed during intravenous  contrast administration. RADIATION DOSE REDUCTION: This exam was performed according to the departmental dose-optimization program which includes automated exposure control, adjustment of the mA and/or kV according to patient size and/or use of iterative reconstruction technique. CONTRAST:  75mL OMNIPAQUE IOHEXOL 300 MG/ML  SOLN COMPARISON:  Chest radiograph dated 08/18/2022. CT chest dated 07/21/2022 FINDINGS: Cardiovascular: The heart is normal in size. No pericardial effusion. No evidence of thoracic aortic aneurysm. Atherosclerotic calcifications of the aortic arch. Mediastinum/Nodes: No suspicious mediastinal lymphadenopathy. Visualized thyroid is unremarkable. Lungs/Pleura: Status post right upper lobectomy. Numerous bilateral pulmonary nodules/metastases, including: --18 mm subpleural nodule in the lateral right lower lobe (series 4/image 49), previously 17 mm --13 mm nodule in the posteromedial right lower lobe (series 8/image 99), previously 14 mm --14 mm nodule in the posterior left lower lobe (series 8/image 110),  unchanged Overall, these are considered unchanged. Small right pleural effusion, progressive. Trace left pleural effusion, new. No focal consolidation. No pneumothorax. Upper Abdomen: Visualized upper abdomen is notable for wall thickening and mild pericolonic inflammatory changes involving the left colon (series 2/image 159), corresponding to the patient's known infectious/inflammatory colitis. 9 mm hypoenhancing lesion in the posterior right liver (series 2/image 126), indeterminate. Moderate hiatal hernia. Cholelithiasis, without associated inflammatory changes. Vascular calcifications. Musculoskeletal: Mild degenerative changes of the upper lumbar spine. IMPRESSION: Status post right upper lobectomy. Numerous bilateral pulmonary nodules/metastases, unchanged. Small right pleural effusion, progressive. Trace left pleural effusion, new. Additional ancillary findings as above. Aortic  Atherosclerosis (ICD10-I70.0). Electronically Signed   By: Charline Bills M.D.   On: 08/19/2022 23:29   MR BRAIN WO CONTRAST  Result Date: 08/18/2022 CLINICAL DATA:  Acute neurologic deficit EXAM: MRI HEAD WITHOUT CONTRAST TECHNIQUE: Multiplanar, multiecho pulse sequences of the brain and surrounding structures were obtained without intravenous contrast. COMPARISON:  None Available. FINDINGS: Brain: No acute infarct, mass effect or extra-axial collection. No acute or chronic hemorrhage. There is multifocal hyperintense T2-weighted signal within the white matter. Parenchymal volume and CSF spaces are normal. The midline structures are normal. Vascular: Major flow voids are preserved. Skull and upper cervical spine: Normal calvarium and skull base. Visualized upper cervical spine and soft tissues are normal. Sinuses/Orbits:No paranasal sinus fluid levels or advanced mucosal thickening. No mastoid or middle ear effusion. Normal orbits. IMPRESSION: 1. No acute intracranial abnormality. 2. Findings of chronic small vessel ischemia. Electronically Signed   By: Deatra Robinson M.D.   On: 08/18/2022 23:09   CT ABDOMEN PELVIS W CONTRAST  Result Date: 08/18/2022 CLINICAL DATA:  Abdominal pain with diarrhea and fever for 3 days. History of C difficile colitis. EXAM: CT ABDOMEN AND PELVIS WITH CONTRAST TECHNIQUE: Multidetector CT imaging of the abdomen and pelvis was performed using the standard protocol following bolus administration of intravenous contrast. RADIATION DOSE REDUCTION: This exam was performed according to the departmental dose-optimization program which includes automated exposure control, adjustment of the mA and/or kV according to patient size and/or use of iterative reconstruction technique. CONTRAST:  80mL OMNIPAQUE IOHEXOL 300 MG/ML  SOLN COMPARISON:  Abdominopelvic CT 11/01/2021.  Chest CT 07/21/2022. FINDINGS: Lower chest: Again demonstrated are multiple irregular nodules at both lung bases which  have enlarged from the abdominal CT of 11/01/2021 but are similar to the more recent chest CT of last month. These nodules remain consistent with metastatic lung cancer (no non-small cell lung cancer). Representative lesions include a medial right lower lobe nodule measuring 1.5 x 0.9 cm on image 4/3 and a left lower lobe nodule measuring 1.0 cm on image 7/3. Moderate size hiatal hernia. Hepatobiliary: The liver is normal in density without suspicious focal abnormality. A small low-density lesion peripherally in the right hepatic lobe on image 14/2 is stable, consistent with a benign finding. Stable mild periportal edema. No evidence of gallstones, gallbladder wall thickening or biliary dilatation. Pancreas: Unremarkable. No pancreatic ductal dilatation or surrounding inflammatory changes. Spleen: Normal in size without focal abnormality. Adrenals/Urinary Tract: Both adrenal glands appear normal. No evidence of urinary tract calculus, suspicious renal lesion or hydronephrosis. Probable extrarenal pelves bilaterally without evidence of ureteral obstruction. The bladder appears unremarkable for its degree of distention. Stomach/Bowel: No enteric contrast administered. As above, moderate-size hiatal hernia. The stomach otherwise appears unremarkable for its degree of distention. No small bowel distension, wall thickening or surrounding inflammation identified. There is some fecalization of the distal small bowel. There  is mild colonic wall thickening extending from the cecum into the descending colon, consistent with colitis. Vascular/Lymphatic: There are no enlarged abdominal or pelvic lymph nodes. Aortic and branch vessel atherosclerosis without evidence of aneurysm or large vessel occlusion. Reproductive: Previous hysterectomy.  No evidence of adnexal mass. Other: No ascites, peritoneal nodularity or pneumoperitoneum. Musculoskeletal: Previous right total hip arthroplasty. Chronic incompletely healed fractures of  the right superior and inferior pubic rami. Severe multilevel spondylosis associated with a convex right thoracolumbar scoliosis. There are chronic bilateral L5 pars defects which contribute to a chronic anterolisthesis and biforaminal narrowing at L5-S1. No acute osseous findings are seen. Unless specific follow-up recommendations are mentioned in the findings or impression sections, no imaging follow-up of any mentioned incidental findings is recommended. IMPRESSION: 1. Mild colonic wall thickening extending from the cecum into the descending colon, consistent with colitis. No evidence of bowel obstruction, perforation or abscess. 2. No other acute abdominopelvic findings. 3. Known metastatic lung cancer at both lung bases, similar to recent chest CT of last month. 4. Moderate-size hiatal hernia. 5. Chronic incompletely healed fractures of the right superior and inferior pubic rami. Chronic bilateral L5 pars defects with associated anterolisthesis and biforaminal narrowing at L5-S1. 6.  Aortic Atherosclerosis (ICD10-I70.0). Electronically Signed   By: Carey Bullocks M.D.   On: 08/18/2022 18:04   DG Chest Port 1 View  Result Date: 08/18/2022 CLINICAL DATA:  Fever, diarrhea.  Sepsis EXAM: PORTABLE CHEST 1 VIEW COMPARISON:  X-ray 01/02/2022 FINDINGS: Borderline enlarged cardiopericardial silhouette with a calcified aorta. Apical pleural thickening on the right-greater-than-left. There is some scarring and fibrotic changes along the medial right lung base. No pneumothorax, effusion or consolidation. There are some subtle areas of nodularity in the left mid midlung. Please correlate with CT scan of 07/21/2022 describing multiple lung nodules. Patient has a history of non-small-cell lung cancer. Overlapping cardiac leads. Diffuse chronic lung changes. Surgical clips in the medial left upper thorax. Osteopenia. Degenerative changes of the shoulders and spine IMPRESSION: Enlarged heart with chronic lung changes. Areas  of scarring and fibrotic changes. Multiple lung nodules as seen on prior CT of 07/21/2018. No consolidation or effusion. Electronically Signed   By: Karen Kays M.D.   On: 08/18/2022 18:00   CT Head Wo Contrast  Result Date: 08/18/2022 CLINICAL DATA:  Neuro deficit, acute, stroke suspected EXAM: CT HEAD WITHOUT CONTRAST TECHNIQUE: Contiguous axial images were obtained from the base of the skull through the vertex without intravenous contrast. RADIATION DOSE REDUCTION: This exam was performed according to the departmental dose-optimization program which includes automated exposure control, adjustment of the mA and/or kV according to patient size and/or use of iterative reconstruction technique. COMPARISON:  MRI head August 01, 2013. FINDINGS: Brain: No evidence of acute infarction, hemorrhage, hydrocephalus, extra-axial collection or mass lesion/mass effect. Patchy white matter hypodensities, nonspecific but compatible with chronic microvascular ischemic disease. Cerebral atrophy. Vascular: No hyperdense vessel. Skull: No acute fracture. Sinuses/Orbits: Clear sinuses.  No acute orbital findings. Other: No mastoid effusions. IMPRESSION: Senescent changes without evidence of acute abnormality. Electronically Signed   By: Feliberto Harts M.D.   On: 08/18/2022 17:59     Scheduled Meds:  carvedilol  3.125 mg Oral BID WC   enoxaparin (LOVENOX) injection  30 mg Subcutaneous Q24H   levothyroxine  75 mcg Oral Q0600   Continuous Infusions:   LOS: 2 days   Time spent: 16  Rhetta Mura, MD [facility] To contact the attending provider between 7A-7P or the covering provider during  after hours 7P-7A, please log into the web site [email or web address] and access using universal [place] password for that web site. If you do not have the password, please call the hospital operator.  08/20/2022, 12:15 PM

## 2022-08-20 NOTE — Progress Notes (Signed)
Transition of Care [facility]) - Inpatient Brief Assessment   Patient Details  Name: [name] MRN: [id number] Date of Birth: Dec 19, 1930  [facility]) CM/SW Contact:    Larrie Kass, LCSW Phone Number: 08/20/2022, 1:43 PM   Clinical Narrative:  Transition of Care Department [facility]) has reviewed patient and no TOC needs have been identified at this time. We will continue to monitor patient advancement through interdisciplinary progression rounds. If new patient transition needs arise, please place a TOC consult.  Transition of Care Asessment: Insurance and Status: Insurance coverage has been reviewed Patient has primary care physician: Yes Home environment has been reviewed: yes   Prior/Current Home Services: No current home services Social Determinants of Health Reivew: SDOH reviewed no interventions necessary Readmission risk has been reviewed: Yes Transition of care needs: no transition of care needs at this time

## 2022-08-21 ENCOUNTER — Inpatient Hospital Stay (HOSPITAL_COMMUNITY): Payer: Medicare PPO

## 2022-08-21 DIAGNOSIS — R509 Fever, unspecified: Secondary | ICD-10-CM

## 2022-08-21 DIAGNOSIS — M069 Rheumatoid arthritis, unspecified: Secondary | ICD-10-CM | POA: Diagnosis not present

## 2022-08-21 DIAGNOSIS — A0472 Enterocolitis due to Clostridium difficile, not specified as recurrent: Secondary | ICD-10-CM | POA: Diagnosis not present

## 2022-08-21 DIAGNOSIS — K529 Noninfective gastroenteritis and colitis, unspecified: Secondary | ICD-10-CM | POA: Diagnosis not present

## 2022-08-21 DIAGNOSIS — Z87440 Personal history of urinary (tract) infections: Secondary | ICD-10-CM | POA: Diagnosis not present

## 2022-08-21 LAB — CBC WITH DIFFERENTIAL/PLATELET
Abs Immature Granulocytes: 0.83 10*3/uL — ABNORMAL HIGH (ref 0.00–0.07)
Basophils Absolute: 0.1 10*3/uL (ref 0.0–0.1)
Basophils Relative: 1 %
Eosinophils Absolute: 0.2 10*3/uL (ref 0.0–0.5)
Eosinophils Relative: 1 %
HCT: 32.7 % — ABNORMAL LOW (ref 36.0–46.0)
Hemoglobin: 10.1 g/dL — ABNORMAL LOW (ref 12.0–15.0)
Immature Granulocytes: 5 %
Lymphocytes Relative: 7 %
Lymphs Abs: 1.1 10*3/uL (ref 0.7–4.0)
MCH: 27.9 pg (ref 26.0–34.0)
MCHC: 30.9 g/dL (ref 30.0–36.0)
MCV: 90.3 fL (ref 80.0–100.0)
Monocytes Absolute: 0.8 10*3/uL (ref 0.1–1.0)
Monocytes Relative: 5 %
Neutro Abs: 12.6 10*3/uL — ABNORMAL HIGH (ref 1.7–7.7)
Neutrophils Relative %: 81 %
Platelets: 266 10*3/uL (ref 150–400)
RBC: 3.62 MIL/uL — ABNORMAL LOW (ref 3.87–5.11)
RDW: 14.6 % (ref 11.5–15.5)
WBC: 15.6 10*3/uL — ABNORMAL HIGH (ref 4.0–10.5)
nRBC: 0 % (ref 0.0–0.2)

## 2022-08-21 LAB — ECHOCARDIOGRAM COMPLETE
Area-P 1/2: 4.31 cm2
Height: 60 in
MV M vel: 6.1 m/s
MV Peak grad: 148.8 mmHg
P 1/2 time: 500 msec
S' Lateral: 4.1 cm
Single Plane A4C EF: 47.2 %
Weight: 1647.98 oz

## 2022-08-21 LAB — COMPREHENSIVE METABOLIC PANEL
ALT: 15 U/L (ref 0–44)
AST: 18 U/L (ref 15–41)
Albumin: 2.6 g/dL — ABNORMAL LOW (ref 3.5–5.0)
Alkaline Phosphatase: 76 U/L (ref 38–126)
Anion gap: 10 (ref 5–15)
BUN: 15 mg/dL (ref 8–23)
CO2: 24 mmol/L (ref 22–32)
Calcium: 8.5 mg/dL — ABNORMAL LOW (ref 8.9–10.3)
Chloride: 96 mmol/L — ABNORMAL LOW (ref 98–111)
Creatinine, Ser: 1.03 mg/dL — ABNORMAL HIGH (ref 0.44–1.00)
GFR, Estimated: 52 mL/min — ABNORMAL LOW (ref >60)
Glucose, Bld: 113 mg/dL — ABNORMAL HIGH (ref 70–99)
Potassium: 4.1 mmol/L (ref 3.5–5.1)
Sodium: 130 mmol/L — ABNORMAL LOW (ref 135–145)
Total Bilirubin: 0.5 mg/dL (ref 0.3–1.2)
Total Protein: 5.4 g/dL — ABNORMAL LOW (ref 6.5–8.1)

## 2022-08-21 LAB — C DIFFICILE QUICK SCREEN W PCR REFLEX
C Diff antigen: NEGATIVE
C Diff interpretation: NOT DETECTED
C Diff toxin: NEGATIVE

## 2022-08-21 MED ORDER — FAMOTIDINE 20 MG PO TABS
40.0000 mg | ORAL_TABLET | Freq: Every day | ORAL | Status: DC
  Administered 2022-08-21: 40 mg via ORAL
  Filled 2022-08-21 (×2): qty 2

## 2022-08-21 NOTE — Progress Notes (Signed)
Subjective:  Complaining of some discomfort after having ultrasound for her echocardiogram still with abdominal pain largely in the left side also with loose stools  Antibiotics:  Anti-infectives (From admission, onward)    Start     Dose/Rate Route Frequency Ordered Stop   08/20/22 2200  fidaxomicin (DIFICID) tablet 200 mg        200 mg Oral 2 times daily 08/20/22 1630 08/30/22 2159   08/19/22 1300  fidaxomicin (DIFICID) tablet 200 mg  Status:  Discontinued        200 mg Oral 2 times daily 08/19/22 0245 08/19/22 0337   08/19/22 0400  fidaxomicin (DIFICID) tablet 200 mg  Status:  Discontinued        200 mg Oral 2 times daily 08/19/22 0338 08/19/22 1504   08/18/22 2200  fidaxomicin (DIFICID) tablet 200 mg        200 mg Oral  Once 08/18/22 1909 08/19/22 0341       Medications: Scheduled Meds:  carvedilol  3.125 mg Oral BID WC   enoxaparin (LOVENOX) injection  30 mg Subcutaneous Q24H   famotidine  40 mg Oral Daily   fidaxomicin  200 mg Oral BID   Gerhardt's butt cream   Topical TID   levothyroxine  75 mcg Oral Q0600   Continuous Infusions:  lactated ringers Stopped (08/21/22 0256)   PRN Meds:.acetaminophen, ondansetron (ZOFRAN) IV, mouth rinse, traMADol    Objective: Weight change:   Intake/Output Summary (Last 24 hours) at 08/21/2022 1504 Last data filed at 08/21/2022 0500 Gross per 24 hour  Intake 188.28 ml  Output --  Net 188.28 ml    Blood pressure (!) 150/71, pulse 67, temperature 98.6 F (37 C), temperature source Oral, resp. rate 18, height 5' (1.524 m), weight 46.7 kg, SpO2 98 %. Temp:  [98.1 F (36.7 C)-98.6 F (37 C)] 98.6 F (37 C) (07/04 0526) Pulse Rate:  [67-74] 67 (07/04 0526) Resp:  [18] 18 (07/04 0526) BP: (133-150)/(58-71) 150/71 (07/04 0526) SpO2:  [96 %-98 %] 98 % (07/04 0526)  Physical Exam: Physical Exam Constitutional:      Appearance: She is well-developed. She is not diaphoretic.  HENT:     Head: Normocephalic and  atraumatic.     Right Ear: External ear normal.     Left Ear: External ear normal.     Mouth/Throat:     Pharynx: No oropharyngeal exudate.  Eyes:     General: No scleral icterus.    Conjunctiva/sclera: Conjunctivae normal.     Pupils: Pupils are equal, round, and reactive to light.  Cardiovascular:     Rate and Rhythm: Normal rate and regular rhythm.     Heart sounds: Murmur heard.     No friction rub. No gallop.  Pulmonary:     Effort: Pulmonary effort is normal. No respiratory distress.     Breath sounds: Normal breath sounds. No wheezing.  Abdominal:     General: Bowel sounds are normal. There is no distension.     Palpations: Abdomen is soft.     Tenderness: There is abdominal tenderness in the left lower quadrant.  Musculoskeletal:        General: No tenderness. Normal range of motion.  Lymphadenopathy:     Cervical: No cervical adenopathy.  Skin:    General: Skin is warm and dry.     Coloration: Skin is pale.  Neurological:     General: No focal deficit present.     Mental Status:  She is alert and oriented to person, place, and time.     Motor: No abnormal muscle tone.  Psychiatric:        Mood and Affect: Mood normal.        Behavior: Behavior normal.        Thought Content: Thought content normal.        Judgment: Judgment normal.      CBC:    BMET Recent Labs    08/20/22 0416 08/21/22 1117  NA 129* 130*  K 3.5 4.1  CL 96* 96*  CO2 24 24  GLUCOSE 99 113*  BUN 14 15  CREATININE 1.16* 1.03*  CALCIUM 7.6* 8.5*      Liver Panel  Recent Labs    08/19/22 0711 08/21/22 1117  PROT 5.6* 5.4*  ALBUMIN 2.8* 2.6*  AST 17 18  ALT 15 15  ALKPHOS 73 76  BILITOT 0.2* 0.5        Sedimentation Rate No results for input(s): "ESRSEDRATE" in the last 72 hours. C-Reactive Protein No results for input(s): "CRP" in the last 72 hours.  Micro Results: Recent Results (from the past 720 hour(s))  Gastrointestinal Panel by PCR , Stool     Status: None    Collection Time: 08/18/22  3:19 PM   Specimen: Urine, Clean Catch; Stool  Result Value Ref Range Status   Campylobacter species NOT DETECTED NOT DETECTED Final   Plesimonas shigelloides NOT DETECTED NOT DETECTED Final   Salmonella species NOT DETECTED NOT DETECTED Final   Yersinia enterocolitica NOT DETECTED NOT DETECTED Final   Vibrio species NOT DETECTED NOT DETECTED Final   Vibrio cholerae NOT DETECTED NOT DETECTED Final   Enteroaggregative E coli (EAEC) NOT DETECTED NOT DETECTED Final   Enteropathogenic E coli (EPEC) NOT DETECTED NOT DETECTED Final   Enterotoxigenic E coli (ETEC) NOT DETECTED NOT DETECTED Final   Shiga like toxin producing E coli (STEC) NOT DETECTED NOT DETECTED Final   Shigella/Enteroinvasive E coli (EIEC) NOT DETECTED NOT DETECTED Final   Cryptosporidium NOT DETECTED NOT DETECTED Final   Cyclospora cayetanensis NOT DETECTED NOT DETECTED Final   Entamoeba histolytica NOT DETECTED NOT DETECTED Final   Giardia lamblia NOT DETECTED NOT DETECTED Final   Adenovirus F40/41 NOT DETECTED NOT DETECTED Final   Astrovirus NOT DETECTED NOT DETECTED Final   Norovirus GI/GII NOT DETECTED NOT DETECTED Final   Rotavirus A NOT DETECTED NOT DETECTED Final   Sapovirus (I, II, IV, and V) NOT DETECTED NOT DETECTED Final    Comment: Performed at [facility], [address] [place], [place] [address]  Resp panel by RT-PCR (RSV, Flu A&B, Covid) Anterior Nasal Swab     Status: None   Collection Time: 08/18/22  3:53 PM   Specimen: Anterior Nasal Swab  Result Value Ref Range Status   SARS Coronavirus 2 by RT PCR NEGATIVE NEGATIVE Final    Comment: (NOTE) SARS-CoV-2 target nucleic acids are NOT DETECTED.  The SARS-CoV-2 RNA is generally detectable in upper respiratory specimens during the acute phase of infection. The lowest concentration of SARS-CoV-2 viral copies this assay can detect is 138 copies/mL. A negative result does not preclude SARS-Cov-2 infection and should  not be used as the sole basis for treatment or other patient management decisions. A negative result may occur with  improper specimen collection/handling, submission of specimen other than nasopharyngeal swab, presence of viral mutation(s) within the areas targeted by this assay, and inadequate number of viral copies(<138 copies/mL). A negative result must  be combined with clinical observations, patient history, and epidemiological information. The expected result is Negative.  Fact Sheet for Patients:  BloggerCourse.com  Fact Sheet for Healthcare Providers:  SeriousBroker.it  This test is no t yet approved or cleared by the [place] FDA and  has been authorized for detection and/or diagnosis of SARS-CoV-2 by FDA under an Emergency Use Authorization (EUA). This EUA will remain  in effect (meaning this test can be used) for the duration of the COVID-19 declaration under Section 564(b)(1) of the Act, 21 U.S.C.section 360bbb-3(b)(1), unless the authorization is terminated  or revoked sooner.       Influenza A by PCR NEGATIVE NEGATIVE Final   Influenza B by PCR NEGATIVE NEGATIVE Final    Comment: (NOTE) The Xpert Xpress SARS-CoV-2/FLU/RSV plus assay is intended as an aid in the diagnosis of influenza from Nasopharyngeal swab specimens and should not be used as a sole basis for treatment. Nasal washings and aspirates are unacceptable for Xpert Xpress SARS-CoV-2/FLU/RSV testing.  Fact Sheet for Patients: BloggerCourse.com  Fact Sheet for Healthcare Providers: SeriousBroker.it  This test is not yet approved or cleared by the [place] FDA and has been authorized for detection and/or diagnosis of SARS-CoV-2 by FDA under an Emergency Use Authorization (EUA). This EUA will remain in effect (meaning this test can be used) for the duration of the COVID-19 declaration under  Section 564(b)(1) of the Act, 21 U.S.C. section 360bbb-3(b)(1), unless the authorization is terminated or revoked.     Resp Syncytial Virus by PCR NEGATIVE NEGATIVE Final    Comment: (NOTE) Fact Sheet for Patients: BloggerCourse.com  Fact Sheet for Healthcare Providers: SeriousBroker.it  This test is not yet approved or cleared by the [place] FDA and has been authorized for detection and/or diagnosis of SARS-CoV-2 by FDA under an Emergency Use Authorization (EUA). This EUA will remain in effect (meaning this test can be used) for the duration of the COVID-19 declaration under Section 564(b)(1) of the Act, 21 U.S.C. section 360bbb-3(b)(1), unless the authorization is terminated or revoked.  Performed at [facility], [address] [place], [place] [address]   Blood Culture (routine x 2)     Status: None (Preliminary result)   Collection Time: 08/18/22  4:05 PM   Specimen: BLOOD  Result Value Ref Range Status   Specimen Description   Final    BLOOD LEFT ANTECUBITAL Performed at [facility], [address] [place], [place] [address]    Special Requests   Final    Blood Culture adequate volume BOTTLES DRAWN AEROBIC AND ANAEROBIC Performed at [facility], [address] [place], [place] [address]    Culture   Final    NO GROWTH 3 DAYS Performed at [facility], [address] [place], [place] [address]    Report Status PENDING  Incomplete  Blood Culture (routine x 2)     Status: None (Preliminary result)   Collection Time: 08/18/22  4:10 PM   Specimen: BLOOD  Result Value Ref Range Status   Specimen Description   Final    BLOOD BLOOD LEFT ARM Performed at [facility], [address] [place], [place] [address]    Special Requests   Final    Blood Culture adequate volume BOTTLES DRAWN AEROBIC AND ANAEROBIC Performed at [facility], [address], [place], [place] [address]    Culture   Final    NO  GROWTH 3 DAYS Performed at [facility], [address], [place], [place] [address]    Report Status PENDING  Incomplete  C Difficile Quick Screen w PCR reflex     Status: None   Collection Time: 08/18/22  6:12 PM   Specimen: Urine, Clean Catch; Stool  Result Value Ref Range Status   C Diff antigen NEGATIVE NEGATIVE Final   C Diff toxin NEGATIVE NEGATIVE Final   C Diff interpretation No C. difficile detected.  Final    Comment: Performed at [facility], [address] [place], [place] [address]  C Difficile Quick Screen w PCR reflex     Status: None   Collection Time: 08/21/22  3:10 AM   Specimen: STOOL  Result Value Ref Range Status   C Diff antigen NEGATIVE NEGATIVE Final   C Diff toxin NEGATIVE NEGATIVE Final   C Diff interpretation No C. difficile detected.  Final    Comment: Performed at [facility], [address], [place], [place] [address]    Studies/Results: ECHOCARDIOGRAM COMPLETE  Result Date: 08/21/2022    ECHOCARDIOGRAM REPORT   Patient Name:   [name] Date of Exam: 08/21/2022 Medical Rec #:  [id number]        Height:       60.0 in Accession #:    [id number]       Weight:       103.0 lb Date of Birth:  10-15-1930        BSA:          1.408 m Patient Age:    89 years         BP:           150/71 mmHg Patient Gender: F                HR:           73 bpm. Exam Location:  Inpatient Procedure: 2D Echo, Color Doppler and Cardiac Doppler Indications:    Fever  History:        Patient has prior history of Echocardiogram examinations, most                 recent 12/24/2021. Chronic kidney disease; Risk                 Factors:Hypertension.  Sonographer:    Delcie Roch RDCS Referring Phys: [id number] Jwan Hornbaker N [place] IMPRESSIONS  1. No vegetations or evidence of SBE.  2. Left ventricular ejection fraction, by estimation, is 40 to 45%. The left ventricle has mildly decreased function. The left  ventricle demonstrates global hypokinesis. The left ventricular internal cavity size was severely dilated. Left ventricular diastolic parameters are consistent with Grade I diastolic dysfunction (impaired relaxation).  3. Right ventricular systolic function is normal. The right ventricular size is normal. There is normal pulmonary artery systolic pressure.  4. Left atrial size was moderately dilated.  5. The mitral valve is abnormal. Moderate to severe mitral valve regurgitation. No evidence of mitral stenosis.  6. The aortic valve is tricuspid. There is mild calcification of the aortic valve. There is mild thickening of the aortic valve. Aortic valve regurgitation is mild. Aortic valve sclerosis/calcification is present, without any evidence of aortic stenosis.  7. The inferior vena cava is normal in size with greater than 50% respiratory variability, suggesting right atrial pressure of 3 mmHg. FINDINGS  Left Ventricle: Left ventricular ejection fraction, by estimation, is 40 to 45%.  The left ventricle has mildly decreased function. The left ventricle demonstrates global hypokinesis. The left ventricular internal cavity size was severely dilated. There is no left ventricular hypertrophy. Left ventricular diastolic parameters are consistent with Grade I diastolic dysfunction (impaired relaxation). Right Ventricle: The right ventricular size is normal. No increase in right ventricular wall thickness. Right ventricular systolic function is normal. There is normal pulmonary artery systolic pressure. The tricuspid regurgitant velocity is 2.85 m/s, and  with an assumed right atrial pressure of 3 mmHg, the estimated right ventricular systolic pressure is 35.5 mmHg. Left Atrium: Left atrial size was moderately dilated. Right Atrium: Right atrial size was normal in size. Pericardium: There is no evidence of pericardial effusion. Mitral Valve: The mitral valve is abnormal. There is moderate thickening of the mitral valve  leaflet(s). There is mild calcification of the mitral valve leaflet(s). Moderate to severe mitral valve regurgitation. No evidence of mitral valve stenosis. Tricuspid Valve: The tricuspid valve is normal in structure. Tricuspid valve regurgitation is mild . No evidence of tricuspid stenosis. Aortic Valve: The aortic valve is tricuspid. There is mild calcification of the aortic valve. There is mild thickening of the aortic valve. Aortic valve regurgitation is mild. Aortic regurgitation PHT measures 500 msec. Aortic valve sclerosis/calcification is present, without any evidence of aortic stenosis. Pulmonic Valve: The pulmonic valve was normal in structure. Pulmonic valve regurgitation is not visualized. No evidence of pulmonic stenosis. Aorta: The aortic root is normal in size and structure. Venous: The inferior vena cava is normal in size with greater than 50% respiratory variability, suggesting right atrial pressure of 3 mmHg. IAS/Shunts: No atrial level shunt detected by color flow Doppler. Additional Comments: No vegetations or evidence of SBE.  LEFT VENTRICLE PLAX 2D LVIDd:         5.30 cm      Diastology LVIDs:         4.10 cm      LV e' medial:    5.44 cm/s LV PW:         1.00 cm      LV E/e' medial:  10.5 LV IVS:        0.90 cm      LV e' lateral:   5.87 cm/s LVOT diam:     2.10 cm      LV E/e' lateral: 9.7 LV SV:         69 LV SV Index:   49 LVOT Area:     3.46 cm  LV Volumes (MOD) LV vol d, MOD A4C: 101.0 ml LV vol s, MOD A4C: 53.3 ml LV SV MOD A4C:     101.0 ml RIGHT VENTRICLE             IVC RV Basal diam:  2.60 cm     IVC diam: 1.50 cm RV S prime:     14.40 cm/s TAPSE (M-mode): 2.6 cm LEFT ATRIUM             Index        RIGHT ATRIUM           Index LA diam:        3.40 cm 2.41 cm/m   RA Area:     12.10 cm LA Vol (A2C):   73.6 ml 52.28 ml/m  RA Volume:   23.80 ml  16.90 ml/m LA Vol (A4C):   52.6 ml 37.36 ml/m LA Biplane Vol: 65.4 ml 46.45 ml/m  AORTIC VALVE LVOT Vmax:   103.00 cm/s LVOT  Vmean:   66.700 cm/s LVOT VTI:    0.199 m AI PHT:      500 msec  AORTA Ao Root diam: 3.00 cm MITRAL VALVE                TRICUSPID VALVE MV Area (PHT): 4.31 cm     TR Peak grad:   32.5 mmHg MV Decel Time: 176 msec     TR Vmax:        285.00 cm/s MR Peak grad: 148.8 mmHg MR Mean grad: 89.0 mmHg     SHUNTS MR Vmax:      610.00 cm/s   Systemic VTI:  0.20 m MR Vmean:     434.0 cm/s    Systemic Diam: 2.10 cm MV E velocity: 56.90 cm/s MV A velocity: 109.00 cm/s MV E/A ratio:  0.52 Charlton Haws MD Electronically signed by Charlton Haws MD Signature Date/Time: 08/21/2022/1:51:46 PM    Final    CT CHEST W CONTRAST  Result Date: 08/19/2022 CLINICAL DATA:  Fever of unknown origin.  History of lung cancer EXAM: CT CHEST WITH CONTRAST TECHNIQUE: Multidetector CT imaging of the chest was performed during intravenous contrast administration. RADIATION DOSE REDUCTION: This exam was performed according to the departmental dose-optimization program which includes automated exposure control, adjustment of the mA and/or kV according to patient size and/or use of iterative reconstruction technique. CONTRAST:  75mL OMNIPAQUE IOHEXOL 300 MG/ML  SOLN COMPARISON:  Chest radiograph dated 08/18/2022. CT chest dated 07/21/2022 FINDINGS: Cardiovascular: The heart is normal in size. No pericardial effusion. No evidence of thoracic aortic aneurysm. Atherosclerotic calcifications of the aortic arch. Mediastinum/Nodes: No suspicious mediastinal lymphadenopathy. Visualized thyroid is unremarkable. Lungs/Pleura: Status post right upper lobectomy. Numerous bilateral pulmonary nodules/metastases, including: --18 mm subpleural nodule in the lateral right lower lobe (series 4/image 49), previously 17 mm --13 mm nodule in the posteromedial right lower lobe (series 8/image 99), previously 14 mm --14 mm nodule in the posterior left lower lobe (series 8/image 110), unchanged Overall, these are considered unchanged. Small right pleural effusion, progressive. Trace  left pleural effusion, new. No focal consolidation. No pneumothorax. Upper Abdomen: Visualized upper abdomen is notable for wall thickening and mild pericolonic inflammatory changes involving the left colon (series 2/image 159), corresponding to the patient's known infectious/inflammatory colitis. 9 mm hypoenhancing lesion in the posterior right liver (series 2/image 126), indeterminate. Moderate hiatal hernia. Cholelithiasis, without associated inflammatory changes. Vascular calcifications. Musculoskeletal: Mild degenerative changes of the upper lumbar spine. IMPRESSION: Status post right upper lobectomy. Numerous bilateral pulmonary nodules/metastases, unchanged. Small right pleural effusion, progressive. Trace left pleural effusion, new. Additional ancillary findings as above. Aortic Atherosclerosis (ICD10-I70.0). Electronically Signed   By: Charline Bills M.D.   On: 08/19/2022 23:29      Assessment/Plan:  INTERVAL HISTORY:   2nd C difficile toxin/ag negative (albeit after one dose of dificid given on 08/19/2022 Improving WBC---DESPITE having not received dificid between yesterday's CBC and this am's  Principal Problem:   Colitis    [name] is a 89 y.o. female with A past medical history including NSCLC, seronegative RA, having been on Orencia, multiple other autoimmune conditions, IBS Solik and diastolic CHF, severe aortic valve regurgitation who was diagnosed in March with C difficile coltis by PCR but not toxin/antigen assay but had a story that sounded fairly convincing for c difficile response to vancomycinlater diagnosed again in April by PCR testing and treated Vancomycin for few days followed by Dificid and then with another recurrence in June this  time actually with toxin antigen positivity (we had a great deal of difficulty locating this test but it is in epic) with response to Dificid but then with recurrence of symptoms.  My partner Dr. Drue Second to try to get her Dificid but  the patient could not find today pharmacy and ended up presenting to med [facility].  She had some more disconcerting symptoms including low blood pressure and a transient episode where she could not move her right arm.  She has been admitted with to [facility]  CT showed ? Colitis but her C difficile antigen/toxin assay was negative and yesterday she was having formed bowel movement and nto having frequent bowel movements   She has now had a few loose BM again  Her WBC has climbed and she remained febrile  Yesterday we sent another C. difficile toxin antigen which was negative.  She was to have resumed fidaxomicin but actually never received her nighttime dose of fidaxomicin did not get a dose to this morning after her blood was drawn.  Her CBC in the interim had improved with a white blood cell count dropping from 24,600-15,600.  I wonder if she might have a non C difficile colitis that is resolving without antibiotics?  I am oK with continuing the dificid --though this particular episode is not clearly C difficile  Would NOT give her PPI as it is a risk factor for C difficile  I have personally spent 50 minutes involved in face-to-face and non-face-to-face activities for this patient on the day of the visit. Professional time spent includes the following activities: Preparing to see the patient (review of tests), Obtaining and/or reviewing separately obtained history (admission/discharge record), Performing a medically appropriate examination and/or evaluation , Ordering medications/tests/procedures, referring and communicating with other health care professionals, Documenting clinical information in the EMR, Independently interpreting results (not separately reported), Communicating results to the patient/family/caregiver, Counseling and educating the patient/family/caregiver and Care coordination (not separately reported).      I am available for questions for the remainder of  the weekend and will keep an eye on her chart and see her again, likely on Sunday.        LOS: 3 days   Acey Lav 08/21/2022, 3:04 PM

## 2022-08-21 NOTE — Care Management Important Message (Signed)
Important Message  Patient Details IM Letter given. Name: DEMY LANDGREBE MRN: [id number] Date of Birth: 07-27-1930   Medicare Important Message Given:  Yes     Caren Macadam 08/21/2022, 11:48 AM

## 2022-08-21 NOTE — Hospital Course (Signed)
     ECHOCARDIOGRAM REPORT      1. No vegetations or evidence of SBE. 2. Left ventricular ejection fraction, by estimation, is 40 to 45%. The left ventricle has mildly decreased function. The left ventricle demonstrates global hypokinesis. The left ventricular internal cavity size was severely dilated. Left ventricular diastolic parameters are consistent with Grade I diastolic dysfunction (impaired relaxation).  3. Right ventricular systolic function is normal. The right ventricular size is normal. There is normal pulmonary artery systolic pressure.  4. Left atrial size was moderately dilated.  5. The mitral valve is abnormal. Moderate to severe mitral valve regurgitation. No evidence of mitral stenosis.  6. The aortic valve is tricuspid. There is mild calcification of the aortic valve. There is mild thickening of the aortic valve. Aortic valve regurgitation is mild. Aortic valve sclerosis/calcification is present, without any evidence of aortic stenosis.  7. The inferior vena cava is normal in size with greater than 50% respiratory variability, suggesting right atrial pressure of 3 mmHg.

## 2022-08-21 NOTE — Progress Notes (Addendum)
HOSPITALIST ROUNDING NOTE [name] ZOX:[id number]  DOB: 12/11/1930  DOA: 08/18/2022  PCP: Pearline Cables, MD  Code Status: full  From:  home  Current Dispo: likely home    08/21/2022, 6:28 PM,  LOS: 57 days   89 year old white female Prior thoracotomy right upper lobectomy for stage IIa NSCLC R UL lung cancer 2015 follows with Dr. Mackie Pai was previously on Tagrisso with 4 weeks of treatment and this was discontinued secondary to drug-induced pneumonitis LBBB--dilated cardiomyopathy-does have systolic diastolic heart failure followed by Dr. Jens Som Prior right hip repair by Dr. Magnus Ivan with dislocation and revision of liner and hip ball 01/24/2018 CKD 3 A HTN Large right femoral pseudoaneurysm CFA, right popliteal artery occlusion 04/17/2018 admission Symptomatic anemia with prior colonoscopy in 2023 polyps removed--- also grade 2 internal hemorrhoids--underwent urgent flex sig 06/2018 for bleeding rectal ulcer requiring hemostatic clips etc. Previous NSVT during hospitalization Discoid lupus and Sjogren syndrome--rheumatoid arthritis additionally follows with Dr. Corliss Skains   It appears she has previously had C. difficile in March 2024-she was prescribed vancomycin 125 4 times daily for 10 days on then developed urgency again on 06/16/2022 per GI note--it looks like she started taking Bactrim for UTI and then had a recurrence and was treated with Dificid  Patient went to see Dr. 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 difficile testing negative x 2--differential diagnosis  unclear-CT chest negative, echo negative for Veg Await calprotectin, lactoferrin Patient's white count improved without receiving Dificid (was not given last night this morning) so I am not really sure if this is an infectious diarrhea or combination of post C. difficile IBS-her exam today reveals she is completely nontender and right lower quadrant and left upper quadrant-we have elected to resume Dificid after discussion with infectious disease  Discoid lupus Sjogren's sicca syndrome rheumatoid on abatacept every 28 days-follows with Dr. Corliss Skains ?usually these diseases do not cause diarrhea resume abatacept in the outpatient setting when able-has not been on this for several months because of recurrent infections Resume gabapentin 300 3 times daily at time of discharge-holding Norco 1 twice daily and would discontinue at time of discharge  Gerd Only pepcid--hold PPI  RUL lung cancer 2015 previously Tagrisso discontinued secondary to pneumonitis--progressive cancer Dr. Shirline Frees to treatment team as courtesy-no current oncological requirements  HFmEF-echo 40-45% grade 2 diastolic dysfunction 08/21/2022 Continue saline 50 cc/h  Continue Coreg 3.125 twice daily--- hold Lasix 40 daily for now  Normocytic anemia in the setting of prior rectal ulcer, polypectomies in 2023 No real source of bleeding   DVT prophylaxis: lovenox  Status is: Inpatient Remains inpatient appropriate because:   Cdiff    Subjective:  3 stool today--unformed--not given dificid for some reason--yet WBC down No CP fever Not eating much--had reflux--added pecid with relief   Objective + exam Vitals:   08/20/22 1400 08/20/22 1957 08/21/22 0526 08/21/22 1559  BP: 139/69 (!) 133/58 (!) 150/71 (!) 153/74  Pulse: 76 74 67 67  Resp: (!) 24 18 18  (!) 24  Temp: 98.6 F (37 C) 98.1 F (36.7 C) 98.6 F (37 C) 98.3 F (36.8 C)  TempSrc: Oral Oral Oral Oral  SpO2: 97% 96%  98% 95%  Weight:      Height:        Filed Weights   08/18/22 1454  Weight: 46.7 kg    Examination:  Awake coherent in nad Cta b Abd soft--no rebound no guard--decreased tender in RLQ, LUQ No edema  Data Reviewed: reviewed   CBC    Component Value Date/Time   WBC 15.6 (H) 08/21/2022 1117   RBC 3.62 (L) 08/21/2022 1117   HGB 10.1 (L) 08/21/2022 1117   HGB 11.4 (L) 04/22/2022 1259   HGB 11.0 (L) 08/21/2016 1125   HCT 32.7 (L) 08/21/2022 1117   HCT 33.6 (L) 08/21/2016 1125   PLT 266 08/21/2022 1117   PLT 205 04/22/2022 1259   PLT 160 08/21/2016 1125   PLT 150 09/22/2008 0000   MCV 90.3 08/21/2022 1117   MCV 88.9 08/21/2016 1125   MCH 27.9 08/21/2022 1117   MCHC 30.9 08/21/2022 1117   RDW 14.6 08/21/2022 1117   RDW 13.0 08/21/2016 1125   LYMPHSABS 1.1 08/21/2022 1117   LYMPHSABS 1.0 08/21/2016 1125   MONOABS 0.8 08/21/2022 1117   MONOABS 0.5 08/21/2016 1125   EOSABS 0.2 08/21/2022 1117   EOSABS 0.0 08/21/2016 1125   BASOSABS 0.1 08/21/2022 1117   BASOSABS 0.0 08/21/2016 1125      Latest Ref Rng & Units 08/21/2022   11:17 AM 08/20/2022    4:16 AM 08/19/2022    7:11 AM  CMP  Glucose 70 - 99 mg/dL 960  99  454   BUN 8 - 23 mg/dL 15  14  17    Creatinine 0.44 - 1.00 mg/dL 0.98  1.19  1.47   Sodium 135 - 145 mmol/L 130  129  128   Potassium 3.5 - 5.1 mmol/L 4.1  3.5  3.7   Chloride 98 - 111 mmol/L 96  96  95   CO2 22 - 32 mmol/L 24  24  25    Calcium 8.9 - 10.3 mg/dL 8.5  7.6  7.7   Total Protein 6.5 - 8.1 g/dL 5.4   5.6   Total Bilirubin 0.3 - 1.2 mg/dL 0.5   0.2   Alkaline Phos 38 - 126 U/L 76   73   AST 15 - 41 U/L 18   17   ALT 0 - 44 U/L 15   15      Scheduled Meds:  carvedilol  3.125 mg Oral BID WC   enoxaparin (LOVENOX) injection  30 mg Subcutaneous Q24H   famotidine  40 mg Oral Daily   fidaxomicin  200 mg Oral BID   Gerhardt's butt cream   Topical TID   levothyroxine  75 mcg Oral Q0600   Continuous Infusions:  lactated ringers Stopped (08/21/22 0256)    Time  44  Rhetta Mura, MD  [facility]

## 2022-08-21 NOTE — Progress Notes (Signed)
  Echocardiogram 2D Echocardiogram has been performed.  Delcie Roch 08/21/2022, 12:33 PM

## 2022-08-22 DIAGNOSIS — D649 Anemia, unspecified: Secondary | ICD-10-CM

## 2022-08-22 DIAGNOSIS — R197 Diarrhea, unspecified: Principal | ICD-10-CM

## 2022-08-22 DIAGNOSIS — E871 Hypo-osmolality and hyponatremia: Secondary | ICD-10-CM

## 2022-08-22 DIAGNOSIS — K921 Melena: Secondary | ICD-10-CM | POA: Diagnosis not present

## 2022-08-22 DIAGNOSIS — K529 Noninfective gastroenteritis and colitis, unspecified: Secondary | ICD-10-CM | POA: Diagnosis not present

## 2022-08-22 LAB — COMPREHENSIVE METABOLIC PANEL
ALT: 13 U/L (ref 0–44)
AST: 15 U/L (ref 15–41)
Albumin: 2.3 g/dL — ABNORMAL LOW (ref 3.5–5.0)
Alkaline Phosphatase: 59 U/L (ref 38–126)
Anion gap: 10 (ref 5–15)
BUN: 15 mg/dL (ref 8–23)
CO2: 23 mmol/L (ref 22–32)
Calcium: 8.2 mg/dL — ABNORMAL LOW (ref 8.9–10.3)
Chloride: 95 mmol/L — ABNORMAL LOW (ref 98–111)
Creatinine, Ser: 0.94 mg/dL (ref 0.44–1.00)
GFR, Estimated: 58 mL/min — ABNORMAL LOW (ref >60)
Glucose, Bld: 77 mg/dL (ref 70–99)
Potassium: 3.6 mmol/L (ref 3.5–5.1)
Sodium: 128 mmol/L — ABNORMAL LOW (ref 135–145)
Total Bilirubin: 0.4 mg/dL (ref 0.3–1.2)
Total Protein: 4.8 g/dL — ABNORMAL LOW (ref 6.5–8.1)

## 2022-08-22 LAB — CBC
HCT: 29.1 % — ABNORMAL LOW (ref 36.0–46.0)
Hemoglobin: 9.3 g/dL — ABNORMAL LOW (ref 12.0–15.0)
MCH: 28.6 pg (ref 26.0–34.0)
MCHC: 32 g/dL (ref 30.0–36.0)
MCV: 89.5 fL (ref 80.0–100.0)
Platelets: 253 10*3/uL (ref 150–400)
RBC: 3.25 MIL/uL — ABNORMAL LOW (ref 3.87–5.11)
RDW: 14.4 % (ref 11.5–15.5)
WBC: 11.1 10*3/uL — ABNORMAL HIGH (ref 4.0–10.5)
nRBC: 0 % (ref 0.0–0.2)

## 2022-08-22 MED ORDER — FAMOTIDINE 20 MG PO TABS
20.0000 mg | ORAL_TABLET | Freq: Every day | ORAL | Status: DC
  Administered 2022-08-22 – 2022-08-23 (×2): 20 mg via ORAL
  Filled 2022-08-22: qty 1

## 2022-08-22 MED ORDER — ALUM & MAG HYDROXIDE-SIMETH 200-200-20 MG/5ML PO SUSP
30.0000 mL | Freq: Four times a day (QID) | ORAL | Status: DC | PRN
  Administered 2022-08-22: 30 mL via ORAL
  Filled 2022-08-22: qty 30

## 2022-08-22 MED ORDER — SODIUM CHLORIDE 0.9 % IV SOLN: INTRAVENOUS | Status: DC

## 2022-08-22 NOTE — Progress Notes (Addendum)
[facility] Progress Note  CC:  C. difficile, abdominal pain and diarrhea    Subjective: She feels well this morning.  She passed 2 small volume nonbloody brown muddy bowel movements this morning, stools less watery today.  No abdominal pain.  No nausea or vomiting.  No chest pain or shortness of breath.  No family at the bedside at this time.   Objective:  Vital signs in last 24 hours: Temp:  [98.2 F (36.8 C)-98.5 F (36.9 C)] 98.2 F (36.8 C) (07/05 0812) Pulse Rate:  [67-73] 73 (07/05 0812) Resp:  [20-24] 20 (07/05 0812) BP: (141-160)/(63-77) 160/77 (07/05 0812) SpO2:  [95 %-99 %] 96 % (07/05 0812) Last BM Date : 08/22/22 General: 89 year old female in no acute distress. Heart: Regular rate and rhythm, soft murmur. Pulm: Breath sounds clear throughout. Abdomen: Soft, nondistended.  Very mild tenderness to the left mid to lower abdomen without rebound or guarding.  Positive bowel sounds to all 4 quadrants. Extremities: No edema. Neurologic:  Alert and  oriented x 4.  Speech is clear.  Moves all extremities equally. Psych:  Alert and cooperative. Normal mood and affect.  Intake/Output from previous day: 07/04 0701 - 07/05 0700 In: 50 [P.O.:50] Out: -  Intake/Output this shift: No intake/output data recorded.  Lab Results: Recent Labs    08/20/22 1244 08/21/22 1117 08/22/22 0352  WBC 24.6* 15.6* 11.1*  HGB 9.8* 10.1* 9.3*  HCT 31.6* 32.7* 29.1*  PLT 239 266 253   BMET Recent Labs    08/20/22 0416 08/21/22 1117 08/22/22 0352  NA 129* 130* 128*  K 3.5 4.1 3.6  CL 96* 96* 95*  CO2 24 24 23   GLUCOSE 99 113* 77  BUN 14 15 15   CREATININE 1.16* 1.03* 0.94  CALCIUM 7.6* 8.5* 8.2*   LFT Recent Labs    08/22/22 0352  PROT 4.8*  ALBUMIN 2.3*  AST 15  ALT 13  ALKPHOS 59  BILITOT 0.4   PT/INR No results for input(s): "LABPROT", "INR" in the last 72 hours. Hepatitis Panel No results for input(s): "HEPBSAG", "HCVAB", "HEPAIGM",  "HEPBIGM" in the last 72 hours.  ECHOCARDIOGRAM COMPLETE  Result Date: 08/21/2022    ECHOCARDIOGRAM REPORT   Patient Name:   [name] Date of Exam: 08/21/2022 Medical Rec #:  [id number]        Height:       60.0 in Accession #:    [id number]       Weight:       103.0 lb Date of Birth:  06-03-1930        BSA:          1.408 m Patient Age:    89 years         BP:           150/71 mmHg Patient Gender: F                HR:           73 bpm. Exam Location:  Inpatient Procedure: 2D Echo, Color Doppler and Cardiac Doppler Indications:    Fever  History:        Patient has prior history of Echocardiogram examinations, most                 recent 12/24/2021. Chronic kidney disease; Risk                 Factors:Hypertension.  Sonographer:    Delcie Roch RDCS Referring  Phys: [id number] CORNELIUS N VAN DAM IMPRESSIONS  1. No vegetations or evidence of SBE.  2. Left ventricular ejection fraction, by estimation, is 40 to 45%. The left ventricle has mildly decreased function. The left ventricle demonstrates global hypokinesis. The left ventricular internal cavity size was severely dilated. Left ventricular diastolic parameters are consistent with Grade I diastolic dysfunction (impaired relaxation).  3. Right ventricular systolic function is normal. The right ventricular size is normal. There is normal pulmonary artery systolic pressure.  4. Left atrial size was moderately dilated.  5. The mitral valve is abnormal. Moderate to severe mitral valve regurgitation. No evidence of mitral stenosis.  6. The aortic valve is tricuspid. There is mild calcification of the aortic valve. There is mild thickening of the aortic valve. Aortic valve regurgitation is mild. Aortic valve sclerosis/calcification is present, without any evidence of aortic stenosis.  7. The inferior vena cava is normal in size with greater than 50% respiratory variability, suggesting right atrial pressure of 3 mmHg. FINDINGS  Left Ventricle: Left ventricular  ejection fraction, by estimation, is 40 to 45%. The left ventricle has mildly decreased function. The left ventricle demonstrates global hypokinesis. The left ventricular internal cavity size was severely dilated. There is no left ventricular hypertrophy. Left ventricular diastolic parameters are consistent with Grade I diastolic dysfunction (impaired relaxation). Right Ventricle: The right ventricular size is normal. No increase in right ventricular wall thickness. Right ventricular systolic function is normal. There is normal pulmonary artery systolic pressure. The tricuspid regurgitant velocity is 2.85 m/s, and  with an assumed right atrial pressure of 3 mmHg, the estimated right ventricular systolic pressure is 35.5 mmHg. Left Atrium: Left atrial size was moderately dilated. Right Atrium: Right atrial size was normal in size. Pericardium: There is no evidence of pericardial effusion. Mitral Valve: The mitral valve is abnormal. There is moderate thickening of the mitral valve leaflet(s). There is mild calcification of the mitral valve leaflet(s). Moderate to severe mitral valve regurgitation. No evidence of mitral valve stenosis. Tricuspid Valve: The tricuspid valve is normal in structure. Tricuspid valve regurgitation is mild . No evidence of tricuspid stenosis. Aortic Valve: The aortic valve is tricuspid. There is mild calcification of the aortic valve. There is mild thickening of the aortic valve. Aortic valve regurgitation is mild. Aortic regurgitation PHT measures 500 msec. Aortic valve sclerosis/calcification is present, without any evidence of aortic stenosis. Pulmonic Valve: The pulmonic valve was normal in structure. Pulmonic valve regurgitation is not visualized. No evidence of pulmonic stenosis. Aorta: The aortic root is normal in size and structure. Venous: The inferior vena cava is normal in size with greater than 50% respiratory variability, suggesting right atrial pressure of 3 mmHg. IAS/Shunts: No  atrial level shunt detected by color flow Doppler. Additional Comments: No vegetations or evidence of SBE.  LEFT VENTRICLE PLAX 2D LVIDd:         5.30 cm      Diastology LVIDs:         4.10 cm      LV e' medial:    5.44 cm/s LV PW:         1.00 cm      LV E/e' medial:  10.5 LV IVS:        0.90 cm      LV e' lateral:   5.87 cm/s LVOT diam:     2.10 cm      LV E/e' lateral: 9.7 LV SV:         69 LV  SV Index:   49 LVOT Area:     3.46 cm  LV Volumes (MOD) LV vol d, MOD A4C: 101.0 ml LV vol s, MOD A4C: 53.3 ml LV SV MOD A4C:     101.0 ml RIGHT VENTRICLE             IVC RV Basal diam:  2.60 cm     IVC diam: 1.50 cm RV S prime:     14.40 cm/s TAPSE (M-mode): 2.6 cm LEFT ATRIUM             Index        RIGHT ATRIUM           Index LA diam:        3.40 cm 2.41 cm/m   RA Area:     12.10 cm LA Vol (A2C):   73.6 ml 52.28 ml/m  RA Volume:   23.80 ml  16.90 ml/m LA Vol (A4C):   52.6 ml 37.36 ml/m LA Biplane Vol: 65.4 ml 46.45 ml/m  AORTIC VALVE LVOT Vmax:   103.00 cm/s LVOT Vmean:  66.700 cm/s LVOT VTI:    0.199 m AI PHT:      500 msec  AORTA Ao Root diam: 3.00 cm MITRAL VALVE                TRICUSPID VALVE MV Area (PHT): 4.31 cm     TR Peak grad:   32.5 mmHg MV Decel Time: 176 msec     TR Vmax:        285.00 cm/s MR Peak grad: 148.8 mmHg MR Mean grad: 89.0 mmHg     SHUNTS MR Vmax:      610.00 cm/s   Systemic VTI:  0.20 m MR Vmean:     434.0 cm/s    Systemic Diam: 2.10 cm MV E velocity: 56.90 cm/s MV A velocity: 109.00 cm/s MV E/A ratio:  0.52 Charlton Haws MD Electronically signed by Charlton Haws MD Signature Date/Time: 08/21/2022/1:51:46 PM    Final     Assessment / Plan:  89 year old female with a past medical history of recurrent C. Difficile, readmitted to the hospital with fever, abdominal pain and diarrhea. CTAP without contrast showed  significant diffuse colonic wall thickening consistent with a colitis.  Suspect persistent C. Diff colitis despite negative C. Diff toxin/antigen x 2.  Fecal lactoferrin and  calprotectin levels pending.  Query non C. Diff colitis per ID. WBC continues to downtrend.  Afebrile.  Hemodynamically stable. -Continue Fidaxomicin bid  -No PPI -Diet as tolerated -Diagnostic colonoscopy deferred for now -Appreciate ID recommendations  Normocytic anemia. Hg 10.1 -> 9.3. No overt GI bleeding. CBC, iron and ferritin level in am.   Hyponatremia. Na+ 128.  Principal Problem:   Colitis     LOS: 4 days   Arnaldo Natal  08/22/2022, 10:08 AM  Chart reviewed and case discussed with our APP earlier today and with [facility] via chat message.  Suspect infectious colitis, and may actually be C. difficile despite repeat negative testing.  She is improved on Pradaxa Meissen, WBC now nearly normalized. Additional infectious workup negative GI pathogen panel on admission.  I recommend completing the course of Pradaxa mycin and close follow-up with infectious disease to determine candidacy for fecal microbiota therapy.  No current plans for colonoscopy.  We will see this patient again as needed and if called.  (I also updated her primary GI physician Dr. Barron Alvine earlier today)   - Amada Jupiter,  MD    [facility]

## 2022-08-22 NOTE — Progress Notes (Signed)
HOSPITALIST ROUNDING NOTE [name] ZOX:[id number]  DOB: 1930-04-05  DOA: 08/18/2022  PCP: Pearline Cables, MD  Code Status: full  From:  home  Current Dispo: likely home    08/22/2022, 4:02 PM,  LOS: 41 days   89 year old white female Prior thoracotomy right upper lobectomy for stage IIa NSCLC R UL lung cancer 2015 follows with Dr. Agustin Cree on Edgar Frisk with 4 weeks of treatment [discontinued secondary to drug-induced pneumonitis] LBBB--dilated cardiomyopathy-systolic diastolic heart failure followed by Dr. Jens Som Prior right hip repair by Dr. Magnus Ivan with dislocation and revision of liner and hip ball 01/24/2018 CKD 3 A HTN Large right femoral pseudoaneurysm CFA, right popliteal artery occlusion 04/17/2018 admission Symptomatic anemia with prior colonoscopy in 2023 polyps removed--- also grade 2 internal hemorrhoids--underwent urgent flex sig 06/2018 for bleeding rectal ulcer requiring hemostatic clips etc. Previous NSVT during hospitalization Discoid lupus and Sjogren syndrome--rheumatoid arthritis additionally follows with Dr. Corliss Skains  previously had C. difficile in March 2024-she was prescribed vancomycin 125 4 times daily for 10 days on then developed urgency again on 06/16/2022 per GI note--it looks like she started taking Bactrim for UTI and then had a recurrence and was treated with Dificid  saw Dr. Jerolyn Center 6/24 started Dificid immediately until they could consider possible fecal transplant as an outpatient  Daughter reports ate "spoiled egg" 6/28--started having some Diarrhea--seemed better until 7/1 or 7/2 and then was anorexic, scared to eat and having copious loose BM's  7/3 CT chest inconclusive for other infectious pathology 7/4 echocardiogram EF 40-45% severe dilatation grade 1 diastolic dysfunction moderate to severe mitral regurg  Plan  ? Food poisoning [known immunosuppression secondary to discoid lupus Sjogren and rheumatoid]--c. diff neg x  2-imaging neg for source Await calprotectin, lactoferrin Patient's white count improved without receiving Dificid (was not given last night this morning)  electing to finish Dificid   Discoid lupus Sjogren's sicca syndrome rheumatoid on abatacept every 28 days-follows with Dr. Corliss Skains resume abatacept in the outpatient setting when able-has not been on this for several months because of recurrent infections Resume gabapentin 300 x 3 times daily -d/c Norco 1 twice daily at time of discharge  Gerd Only pepcid--hold PPI indefinitely  RUL lung cancer 2015 previously Tagrisso discontinued secondary to pneumonitis--progressive cancer Dr. Shirline Frees to treatment team as courtesy-no current oncological requirements  HFmEF-echo 40-45% grade 2 diastolic dysfunction 08/21/2022 Continue saline 50 cc/h  Continue Coreg 3.125 twice daily--- hold Lasix 40 daily   Normocytic anemia in the setting of prior rectal ulcer, polypectomies in 2023 No real source of bleeding  Mild hyponatremia  DVT prophylaxis: lovenox  Status is: Inpatient Remains inpatient appropriate because:   Improved Diarrhea    Subjective:  Only 2 stool today No new other issue Ate double of what she did yesterday no cp fever  Additional history 7/5--patient ate what appeared to be a set of boiled eggs , some of which started rotting in the fridge--she usually boils several eggs and leave them in the fridge unshelled as it is mor convenient for her to do this as she lives alone  Objective + exam Vitals:   08/22/22 0408 08/22/22 0812 08/22/22 1138 08/22/22 1224  BP: (!) 158/71 (!) 160/77 130/69 (!) 143/68  Pulse: 71 73  71  Resp: 20 20  16   Temp: 98.5 F (36.9 C) 98.2 F (36.8 C)  (!) 97 F (36.1 C)  TempSrc: Oral Oral  Oral  SpO2: 97% 96%  96%  Weight:  Height:       Filed Weights   08/18/22 1454  Weight: 46.7 kg    Examination:  Awake coherent in nad Cta b Abd soft--no rebound no guard--decreased  tender in RLQ, LUQ No edema  Data Reviewed: reviewed   CBC    Component Value Date/Time   WBC 11.1 (H) 08/22/2022 0352   RBC 3.25 (L) 08/22/2022 0352   HGB 9.3 (L) 08/22/2022 0352   HGB 11.4 (L) 04/22/2022 1259   HGB 11.0 (L) 08/21/2016 1125   HCT 29.1 (L) 08/22/2022 0352   HCT 33.6 (L) 08/21/2016 1125   PLT 253 08/22/2022 0352   PLT 205 04/22/2022 1259   PLT 160 08/21/2016 1125   PLT 150 09/22/2008 0000   MCV 89.5 08/22/2022 0352   MCV 88.9 08/21/2016 1125   MCH 28.6 08/22/2022 0352   MCHC 32.0 08/22/2022 0352   RDW 14.4 08/22/2022 0352   RDW 13.0 08/21/2016 1125   LYMPHSABS 1.1 08/21/2022 1117   LYMPHSABS 1.0 08/21/2016 1125   MONOABS 0.8 08/21/2022 1117   MONOABS 0.5 08/21/2016 1125   EOSABS 0.2 08/21/2022 1117   EOSABS 0.0 08/21/2016 1125   BASOSABS 0.1 08/21/2022 1117   BASOSABS 0.0 08/21/2016 1125      Latest Ref Rng & Units 08/22/2022    3:52 AM 08/21/2022   11:17 AM 08/20/2022    4:16 AM  CMP  Glucose 70 - 99 mg/dL 77  409  99   BUN 8 - 23 mg/dL 15  15  14    Creatinine 0.44 - 1.00 mg/dL 8.11  9.14  7.82   Sodium 135 - 145 mmol/L 128  130  129   Potassium 3.5 - 5.1 mmol/L 3.6  4.1  3.5   Chloride 98 - 111 mmol/L 95  96  96   CO2 22 - 32 mmol/L 23  24  24    Calcium 8.9 - 10.3 mg/dL 8.2  8.5  7.6   Total Protein 6.5 - 8.1 g/dL 4.8  5.4    Total Bilirubin 0.3 - 1.2 mg/dL 0.4  0.5    Alkaline Phos 38 - 126 U/L 59  76    AST 15 - 41 U/L 15  18    ALT 0 - 44 U/L 13  15       Scheduled Meds:  carvedilol  3.125 mg Oral BID WC   enoxaparin (LOVENOX) injection  30 mg Subcutaneous Q24H   famotidine  20 mg Oral Daily   fidaxomicin  200 mg Oral BID   Gerhardt's butt cream   Topical TID   levothyroxine  75 mcg Oral Q0600   Continuous Infusions:  sodium chloride 50 mL/hr at 08/22/22 1340    Time  44  Rhetta Mura, MD  [facility]

## 2022-08-23 DIAGNOSIS — K529 Noninfective gastroenteritis and colitis, unspecified: Secondary | ICD-10-CM | POA: Diagnosis not present

## 2022-08-23 LAB — IRON AND TIBC
Iron: 53 ug/dL (ref 28–170)
Saturation Ratios: 27 % (ref 10.4–31.8)
TIBC: 195 ug/dL — ABNORMAL LOW (ref 250–450)
UIBC: 142 ug/dL

## 2022-08-23 LAB — COMPREHENSIVE METABOLIC PANEL
ALT: 17 U/L (ref 0–44)
AST: 36 U/L (ref 15–41)
Albumin: 2.7 g/dL — ABNORMAL LOW (ref 3.5–5.0)
Alkaline Phosphatase: 64 U/L (ref 38–126)
Anion gap: 10 (ref 5–15)
BUN: 12 mg/dL (ref 8–23)
CO2: 24 mmol/L (ref 22–32)
Calcium: 8.3 mg/dL — ABNORMAL LOW (ref 8.9–10.3)
Chloride: 94 mmol/L — ABNORMAL LOW (ref 98–111)
Creatinine, Ser: 0.85 mg/dL (ref 0.44–1.00)
GFR, Estimated: >60 mL/min (ref >60)
Glucose, Bld: 80 mg/dL (ref 70–99)
Potassium: 4.4 mmol/L (ref 3.5–5.1)
Sodium: 128 mmol/L — ABNORMAL LOW (ref 135–145)
Total Bilirubin: 0.8 mg/dL (ref 0.3–1.2)
Total Protein: 5.5 g/dL — ABNORMAL LOW (ref 6.5–8.1)

## 2022-08-23 LAB — CULTURE, BLOOD (ROUTINE X 2)
Culture: NO GROWTH
Culture: NO GROWTH
Special Requests: ADEQUATE
Special Requests: ADEQUATE

## 2022-08-23 LAB — CBC
HCT: 33.7 % — ABNORMAL LOW (ref 36.0–46.0)
Hemoglobin: 10.5 g/dL — ABNORMAL LOW (ref 12.0–15.0)
MCH: 28.5 pg (ref 26.0–34.0)
MCHC: 31.2 g/dL (ref 30.0–36.0)
MCV: 91.3 fL (ref 80.0–100.0)
Platelets: 62 10*3/uL — ABNORMAL LOW (ref 150–400)
RBC: 3.69 MIL/uL — ABNORMAL LOW (ref 3.87–5.11)
RDW: 14.5 % (ref 11.5–15.5)
WBC: 9.5 10*3/uL (ref 4.0–10.5)
nRBC: 0 % (ref 0.0–0.2)

## 2022-08-23 LAB — FERRITIN: Ferritin: 266 ng/mL (ref 11–307)

## 2022-08-23 LAB — OSMOLALITY, URINE: Osmolality, Ur: 337 mOsm/kg (ref 300–900)

## 2022-08-23 LAB — SODIUM, URINE, RANDOM: Sodium, Ur: 121 mmol/L

## 2022-08-23 MED ORDER — SODIUM CHLORIDE 1 G PO TABS
1.0000 g | ORAL_TABLET | Freq: Two times a day (BID) | ORAL | Status: DC
  Administered 2022-08-23: 1 g via ORAL
  Filled 2022-08-23: qty 1

## 2022-08-23 MED ORDER — FIDAXOMICIN 200 MG PO TABS: 200.0000 mg | ORAL_TABLET | Freq: Two times a day (BID) | ORAL | 0 refills | Status: AC

## 2022-08-23 MED ORDER — SODIUM CHLORIDE 1 G PO TABS: 1.0000 g | ORAL_TABLET | Freq: Two times a day (BID) | ORAL | 0 refills | Status: DC

## 2022-08-23 MED ORDER — ACETAMINOPHEN 500 MG PO TABS: 500.0000 mg | ORAL_TABLET | Freq: Four times a day (QID) | ORAL | 0 refills | Status: AC | PRN

## 2022-08-23 NOTE — Discharge Summary (Signed)
Physician Discharge Summary  [name] ZOX:[id number] DOB: [birth year] DOA: 08/18/2022  PCP: Pearline Cables, MD  Admit date: 08/18/2022 Discharge date: 08/23/2022  Time spent: 40 minutes  Recommendations for Outpatient Follow-up:  Requires outpatient follow-up with Dr. Ilsa Iha of infectious disease-CC at [facility] Fecal calprotectin and lactoferrin as an outpatient Outpatient surveillance Dr. Shirline Frees oncology CC at discharge Suggest discussion with Dr. Corliss Skains of rheumatology regarding pain management   Discharge Diagnoses:  MAIN problem for hospitalization   Nonspecific colitis DDx unclear at time of discharge food poisoning versus PCR negative C. difficile Euvolemic hyponatremia   Please see below for itemized issues addressed in HOpsital- refer to other progress notes for clarity if needed  Discharge Condition: Improved  Diet recommendation: Would use regular salt diet  Filed Weights   08/18/22 1454  Weight: 46.7 kg    History of present illness:  89 year old white female Prior thoracotomy right upper lobectomy for stage IIa NSCLC R UL lung cancer 2015 follows with Dr. Agustin Cree on Phenix with 4 weeks of treatment [discontinued secondary to drug-induced pneumonitis] LBBB--dilated cardiomyopathy-systolic diastolic heart failure followed by Dr. Jens Som Prior right hip repair by Dr. Magnus Ivan with dislocation and revision of liner and hip ball 01/24/2018 CKD 3 A HTN Large right femoral pseudoaneurysm CFA, right popliteal artery occlusion 04/17/2018 admission Symptomatic anemia with prior colonoscopy in 2023 polyps removed--- also grade 2 internal hemorrhoids--underwent urgent flex sig 06/2018 for bleeding rectal ulcer requiring hemostatic clips etc. Previous NSVT during hospitalization Discoid lupus and Sjogren syndrome--rheumatoid arthritis additionally follows with Dr. Corliss Skains   previously had C. difficile in March 2024-she was prescribed  vancomycin 125 4 times daily for 10 days on then developed urgency again on 06/16/2022 per GI note--it looks like she started taking Bactrim for UTI and then had a recurrence and was treated with Dificid   saw Dr. Jerolyn Center 6/24 started Dificid immediately until they could consider possible fecal transplant as an outpatient   Daughter reports ate "spoiled egg" 6/28--started having some Diarrhea--seemed better until 7/1 or 7/2 and then was anorexic, scared to eat and having copious loose BM's   7/3 CT chest inconclusive for other infectious pathology 7/4 echocardiogram EF 40-45% severe dilatation grade 1 diastolic dysfunction moderate to severe mitral regurg  Hospital Course:  ? Food poisoning versus C. difficile negative  diarrhea [known immunosuppression secondary to discoid lupus Sjogren and rheumatoid]--c. diff neg x 2-imaging neg for source. It was felt that this could be combination of possible food poisoning more likely than just C. difficile We will complete a course of Dificid regardless and defer to Dr. Jerolyn Center with infectious disease with regards to other options such as fecal transplant She is only having 1-2 stools at time of discharge  Likely euvolemic hyponatremia/SIADH-serum osmolality 265 associated with sodium 128 as well as hypochloremia points to these Urine studies are pending-she does not want to wait for these results and I think that is okay as long as she has labs soon-patient has had this issue before in the last hospital visits and I have encouraged her to use salt tablets which we have prescribed, do not drink only free water and also taking solute in addition to liquids when she is drinking fluids I have told her to please follow-up with your primary care physician and get labs in about 2 to 3 days and family understands this   Discoid lupus Sjogren's sicca syndrome rheumatoid on abatacept every 28 days-follows with Dr. Corliss Skains resume abatacept in the  outpatient setting when able-has not been on this for sever the al months because of recurrent infections Resume gabapentin 300 x 3 times daily -d/c Norco 1 twice daily at time of discharge   Gerd Only pepcid--hold PPI indefinitely as this can worsen risk for C. difficile   RUL lung cancer 2015 previously Tagrisso discontinued secondary to pneumonitis--progressive cancer Dr. Shirline Frees to treatment team as courtesy-no current oncological requirements   HFmEF-echo 40-45% grade 2 diastolic dysfunction 08/21/2022 Continue saline 50 cc/h  Continue Coreg 3.125 twice daily--- hold Lasix 40 daily and would not resume in the outpatient setting given advanced age and risk for volume depletion   Normocytic anemia in the setting of prior rectal ulcer, polypectomies in 2023 No real source of bleeding   Discharge Exam: Vitals:   08/23/22 0425 08/23/22 0425  BP: (!) 148/71 (!) 148/71  Pulse: 70 70  Resp: 20 20  Temp: 98.3 F (36.8 C) 98.3 F (36.8 C)  SpO2: 97% 97%    Subj on day of d/c   Awake coherent no distress Walked to the restroom feels happy Wants to go home   General Exam on discharge  EOMI NCAT no focal deficit no icterus no pallor No wheeze rales rhonchi Abdomen soft no rebound ROM intact  Discharge Instructions   Discharge Instructions     Diet - low sodium heart healthy   Complete by: As directed    Discharge instructions   Complete by: As directed    You should complete the full course of fidamoxixin/Dificid that has been prescribed-it was clinically not completely clear if this was food poisoning or a recurrent C. difficile episode but it seems strongly that there was a lot of concern for possibility of food poisoning with a rotten egg--- structures to mitigate this would be ensuring that you make food freshly daily and it seems like you have a good plan in place Please follow-up with Dr. Ilsa Iha of infectious disease to discuss Vowst.   Your sodium was a little bit  low and at your request you wanted to go home and I am fine with that provided you kindly meet the contingency to follow-up in the outpatient setting with your primary care in 1 to 2 days while you are on the sodium tablets and ensure that you get labs checked-it sounds like you have a low sodium because you might defeat the intrinsic mechanism to counteract low sodium-therefore 1 way of doing this is to have a solution or solute such as body armor interspersed with water throughout the day--- we have stopped your Lasix because at the ripe age of 43 the risk of dehydration is slightly higher-please talk to your doctor about this Please talk to your rheumatologist about resumption of Orencia--would be ideal to use Tylenol as first choice for pain and tramadol only when you have pain >7/10-we have discontinued your opiates at this hospital stay and you can talk to your rheumatologist about this if they want to resume it Remember that you SHOULD NOT TAKE OMEPRAZOLE Best of luck and have a happy summer   Increase activity slowly   Complete by: As directed       Allergies as of 08/23/2022       Reactions   Amlodipine Rash   Prochlorperazine Edisylate Anaphylaxis   Compazine--- tongue swells and rash   Aspirin Other (See Comments)   Nose bleeds. Cannot take NSAIDS    Cymbalta [duloxetine Hcl] Diarrhea, Nausea And Vomiting, Other (See Comments)  Increased blood pressure   Pamelor [nortriptyline Hcl] Diarrhea, Nausea Only   Increased Heart rate and BP        Medication List     STOP taking these medications    Calcium Carbonate-Vitamin D 500-125 MG-UNIT Tabs   denosumab 60 MG/ML Sosy injection Commonly known as: PROLIA   furosemide 40 MG tablet Commonly known as: LASIX   HYDROcodone-acetaminophen 5-325 MG tablet Commonly known as: NORCO/VICODIN   methocarbamol 500 MG tablet Commonly known as: ROBAXIN   omeprazole 20 MG capsule Commonly known as: PRILOSEC   ORENCIA IV        TAKE these medications    acetaminophen 500 MG tablet Commonly known as: TYLENOL Take 1 tablet (500 mg total) by mouth every 6 (six) hours as needed (pain). What changed: how much to take   Biotin 1000 MCG tablet Take 1,000 mcg by mouth 2 (two) times daily.   carvedilol 3.125 MG tablet Commonly known as: COREG Take 1 tablet (3.125 mg total) by mouth 2 (two) times daily with a meal.   estradiol 0.1 MG/GM vaginal cream Commonly known as: ESTRACE VAGINAL Place one gram vaginally up to three times a week as needed to maintain comfort   fidaxomicin 200 MG Tabs tablet Commonly known as: DIFICID Take 1 tablet (200 mg total) by mouth 2 (two) times daily for 7 days. What changed:  how much to take how to take this when to take this additional instructions   gabapentin 300 MG capsule Commonly known as: NEURONTIN TAKE 1 CAPSULE BY MOUTH IN THE MORNING, 1 IN THE AFTERNOON, AND 2 AT BEDTIME What changed: See the new instructions.   levothyroxine 75 MCG tablet Commonly known as: SYNTHROID TAKE 1 TABLET BY MOUTH DAILY BEFORE BREAKFAST. What changed: when to take this   multivitamin tablet Take 1 tablet by mouth daily.   PROBIOTIC PO Take 1 capsule by mouth daily.   sodium chloride 1 g tablet Take 1 tablet (1 g total) by mouth 2 (two) times daily with a meal.   traMADol 50 MG tablet Commonly known as: ULTRAM TAKE 1/2-1 TABLET BY MOUTH EVERY 8 HOURS AS NEEDED. DO NOT COMBINE WITH OTHER PAIN MEDICATION What changed: See the new instructions.       Allergies  Allergen Reactions   Amlodipine Rash   Prochlorperazine Edisylate Anaphylaxis    Compazine--- tongue swells and rash   Aspirin Other (See Comments)    Nose bleeds. Cannot take NSAIDS    Cymbalta [Duloxetine Hcl] Diarrhea, Nausea And Vomiting and Other (See Comments)    Increased blood pressure   Pamelor [Nortriptyline Hcl] Diarrhea and Nausea Only    Increased Heart rate and BP      The results of  significant diagnostics from this hospitalization (including imaging, microbiology, ancillary and laboratory) are listed below for reference.    Significant Diagnostic Studies: ECHOCARDIOGRAM COMPLETE  Result Date: 08/21/2022    ECHOCARDIOGRAM REPORT   Patient Name:   [name] Date of Exam: 08/21/2022 Medical Rec #:  [id number]        Height:       60.0 in Accession #:    [id number]       Weight:       103.0 lb Date of Birth:  [birth year]        BSA:          1.408 m Patient Age:    89 years         BP:  150/71 mmHg Patient Gender: F                HR:           73 bpm. Exam Location:  Inpatient Procedure: 2D Echo, Color Doppler and Cardiac Doppler Indications:    Fever  History:        Patient has prior history of Echocardiogram examinations, most                 recent 12/24/2021. Chronic kidney disease; Risk                 Factors:Hypertension.  Sonographer:    Delcie Roch RDCS Referring Phys: [id number] CORNELIUS N VAN DAM IMPRESSIONS  1. No vegetations or evidence of SBE.  2. Left ventricular ejection fraction, by estimation, is 40 to 45%. The left ventricle has mildly decreased function. The left ventricle demonstrates global hypokinesis. The left ventricular internal cavity size was severely dilated. Left ventricular diastolic parameters are consistent with Grade I diastolic dysfunction (impaired relaxation).  3. Right ventricular systolic function is normal. The right ventricular size is normal. There is normal pulmonary artery systolic pressure.  4. Left atrial size was moderately dilated.  5. The mitral valve is abnormal. Moderate to severe mitral valve regurgitation. No evidence of mitral stenosis.  6. The aortic valve is tricuspid. There is mild calcification of the aortic valve. There is mild thickening of the aortic valve. Aortic valve regurgitation is mild. Aortic valve sclerosis/calcification is present, without any evidence of aortic stenosis.  7. The inferior vena cava is normal in  size with greater than 50% respiratory variability, suggesting right atrial pressure of 3 mmHg. FINDINGS  Left Ventricle: Left ventricular ejection fraction, by estimation, is 40 to 45%. The left ventricle has mildly decreased function. The left ventricle demonstrates global hypokinesis. The left ventricular internal cavity size was severely dilated. There is no left ventricular hypertrophy. Left ventricular diastolic parameters are consistent with Grade I diastolic dysfunction (impaired relaxation). Right Ventricle: The right ventricular size is normal. No increase in right ventricular wall thickness. Right ventricular systolic function is normal. There is normal pulmonary artery systolic pressure. The tricuspid regurgitant velocity is 2.85 m/s, and  with an assumed right atrial pressure of 3 mmHg, the estimated right ventricular systolic pressure is 35.5 mmHg. Left Atrium: Left atrial size was moderately dilated. Right Atrium: Right atrial size was normal in size. Pericardium: There is no evidence of pericardial effusion. Mitral Valve: The mitral valve is abnormal. There is moderate thickening of the mitral valve leaflet(s). There is mild calcification of the mitral valve leaflet(s). Moderate to severe mitral valve regurgitation. No evidence of mitral valve stenosis. Tricuspid Valve: The tricuspid valve is normal in structure. Tricuspid valve regurgitation is mild . No evidence of tricuspid stenosis. Aortic Valve: The aortic valve is tricuspid. There is mild calcification of the aortic valve. There is mild thickening of the aortic valve. Aortic valve regurgitation is mild. Aortic regurgitation PHT measures 500 msec. Aortic valve sclerosis/calcification is present, without any evidence of aortic stenosis. Pulmonic Valve: The pulmonic valve was normal in structure. Pulmonic valve regurgitation is not visualized. No evidence of pulmonic stenosis. Aorta: The aortic root is normal in size and structure. Venous: The  inferior vena cava is normal in size with greater than 50% respiratory variability, suggesting right atrial pressure of 3 mmHg. IAS/Shunts: No atrial level shunt detected by color flow Doppler. Additional Comments: No vegetations or evidence of SBE.  LEFT VENTRICLE PLAX 2D  LVIDd:         5.30 cm      Diastology LVIDs:         4.10 cm      LV e' medial:    5.44 cm/s LV PW:         1.00 cm      LV E/e' medial:  10.5 LV IVS:        0.90 cm      LV e' lateral:   5.87 cm/s LVOT diam:     2.10 cm      LV E/e' lateral: 9.7 LV SV:         69 LV SV Index:   49 LVOT Area:     3.46 cm  LV Volumes (MOD) LV vol d, MOD A4C: 101.0 ml LV vol s, MOD A4C: 53.3 ml LV SV MOD A4C:     101.0 ml RIGHT VENTRICLE             IVC RV Basal diam:  2.60 cm     IVC diam: 1.50 cm RV S prime:     14.40 cm/s TAPSE (M-mode): 2.6 cm LEFT ATRIUM             Index        RIGHT ATRIUM           Index LA diam:        3.40 cm 2.41 cm/m   RA Area:     12.10 cm LA Vol (A2C):   73.6 ml 52.28 ml/m  RA Volume:   23.80 ml  16.90 ml/m LA Vol (A4C):   52.6 ml 37.36 ml/m LA Biplane Vol: 65.4 ml 46.45 ml/m  AORTIC VALVE LVOT Vmax:   103.00 cm/s LVOT Vmean:  66.700 cm/s LVOT VTI:    0.199 m AI PHT:      500 msec  AORTA Ao Root diam: 3.00 cm MITRAL VALVE                TRICUSPID VALVE MV Area (PHT): 4.31 cm     TR Peak grad:   32.5 mmHg MV Decel Time: 176 msec     TR Vmax:        285.00 cm/s MR Peak grad: 148.8 mmHg MR Mean grad: 89.0 mmHg     SHUNTS MR Vmax:      610.00 cm/s   Systemic VTI:  0.20 m MR Vmean:     434.0 cm/s    Systemic Diam: 2.10 cm MV E velocity: 56.90 cm/s MV A velocity: 109.00 cm/s MV E/A ratio:  0.52 Charlton Haws MD Electronically signed by Charlton Haws MD Signature Date/Time: 08/21/2022/1:51:46 PM    Final    CT CHEST W CONTRAST  Result Date: 08/19/2022 CLINICAL DATA:  Fever of unknown origin.  History of lung cancer EXAM: CT CHEST WITH CONTRAST TECHNIQUE: Multidetector CT imaging of the chest was performed during intravenous contrast  administration. RADIATION DOSE REDUCTION: This exam was performed according to the departmental dose-optimization program which includes automated exposure control, adjustment of the mA and/or kV according to patient size and/or use of iterative reconstruction technique. CONTRAST:  75mL OMNIPAQUE IOHEXOL 300 MG/ML  SOLN COMPARISON:  Chest radiograph dated 08/18/2022. CT chest dated 07/21/2022 FINDINGS: Cardiovascular: The heart is normal in size. No pericardial effusion. No evidence of thoracic aortic aneurysm. Atherosclerotic calcifications of the aortic arch. Mediastinum/Nodes: No suspicious mediastinal lymphadenopathy. Visualized thyroid is unremarkable. Lungs/Pleura: Status post right upper lobectomy. Numerous bilateral pulmonary nodules/metastases, including: --18  mm subpleural nodule in the lateral right lower lobe (series 4/image 49), previously 17 mm --13 mm nodule in the posteromedial right lower lobe (series 8/image 99), previously 14 mm --14 mm nodule in the posterior left lower lobe (series 8/image 110), unchanged Overall, these are considered unchanged. Small right pleural effusion, progressive. Trace left pleural effusion, new. No focal consolidation. No pneumothorax. Upper Abdomen: Visualized upper abdomen is notable for wall thickening and mild pericolonic inflammatory changes involving the left colon (series 2/image 159), corresponding to the patient's known infectious/inflammatory colitis. 9 mm hypoenhancing lesion in the posterior right liver (series 2/image 126), indeterminate. Moderate hiatal hernia. Cholelithiasis, without associated inflammatory changes. Vascular calcifications. Musculoskeletal: Mild degenerative changes of the upper lumbar spine. IMPRESSION: Status post right upper lobectomy. Numerous bilateral pulmonary nodules/metastases, unchanged. Small right pleural effusion, progressive. Trace left pleural effusion, new. Additional ancillary findings as above. Aortic Atherosclerosis  (ICD10-I70.0). Electronically Signed   By: Charline Bills M.D.   On: 08/19/2022 23:29   MR BRAIN WO CONTRAST  Result Date: 08/18/2022 CLINICAL DATA:  Acute neurologic deficit EXAM: MRI HEAD WITHOUT CONTRAST TECHNIQUE: Multiplanar, multiecho pulse sequences of the brain and surrounding structures were obtained without intravenous contrast. COMPARISON:  None Available. FINDINGS: Brain: No acute infarct, mass effect or extra-axial collection. No acute or chronic hemorrhage. There is multifocal hyperintense T2-weighted signal within the white matter. Parenchymal volume and CSF spaces are normal. The midline structures are normal. Vascular: Major flow voids are preserved. Skull and upper cervical spine: Normal calvarium and skull base. Visualized upper cervical spine and soft tissues are normal. Sinuses/Orbits:No paranasal sinus fluid levels or advanced mucosal thickening. No mastoid or middle ear effusion. Normal orbits. IMPRESSION: 1. No acute intracranial abnormality. 2. Findings of chronic small vessel ischemia. Electronically Signed   By: Deatra Robinson M.D.   On: 08/18/2022 23:09   CT ABDOMEN PELVIS W CONTRAST  Result Date: 08/18/2022 CLINICAL DATA:  Abdominal pain with diarrhea and fever for 3 days. History of C difficile colitis. EXAM: CT ABDOMEN AND PELVIS WITH CONTRAST TECHNIQUE: Multidetector CT imaging of the abdomen and pelvis was performed using the standard protocol following bolus administration of intravenous contrast. RADIATION DOSE REDUCTION: This exam was performed according to the departmental dose-optimization program which includes automated exposure control, adjustment of the mA and/or kV according to patient size and/or use of iterative reconstruction technique. CONTRAST:  80mL OMNIPAQUE IOHEXOL 300 MG/ML  SOLN COMPARISON:  Abdominopelvic CT 11/01/2021.  Chest CT 07/21/2022. FINDINGS: Lower chest: Again demonstrated are multiple irregular nodules at both lung bases which have enlarged  from the abdominal CT of 11/01/2021 but are similar to the more recent chest CT of last month. These nodules remain consistent with metastatic lung cancer (no non-small cell lung cancer). Representative lesions include a medial right lower lobe nodule measuring 1.5 x 0.9 cm on image 4/3 and a left lower lobe nodule measuring 1.0 cm on image 7/3. Moderate size hiatal hernia. Hepatobiliary: The liver is normal in density without suspicious focal abnormality. A small low-density lesion peripherally in the right hepatic lobe on image 14/2 is stable, consistent with a benign finding. Stable mild periportal edema. No evidence of gallstones, gallbladder wall thickening or biliary dilatation. Pancreas: Unremarkable. No pancreatic ductal dilatation or surrounding inflammatory changes. Spleen: Normal in size without focal abnormality. Adrenals/Urinary Tract: Both adrenal glands appear normal. No evidence of urinary tract calculus, suspicious renal lesion or hydronephrosis. Probable extrarenal pelves bilaterally without evidence of ureteral obstruction. The bladder appears unremarkable for its degree  of distention. Stomach/Bowel: No enteric contrast administered. As above, moderate-size hiatal hernia. The stomach otherwise appears unremarkable for its degree of distention. No small bowel distension, wall thickening or surrounding inflammation identified. There is some fecalization of the distal small bowel. There is mild colonic wall thickening extending from the cecum into the descending colon, consistent with colitis. Vascular/Lymphatic: There are no enlarged abdominal or pelvic lymph nodes. Aortic and branch vessel atherosclerosis without evidence of aneurysm or large vessel occlusion. Reproductive: Previous hysterectomy.  No evidence of adnexal mass. Other: No ascites, peritoneal nodularity or pneumoperitoneum. Musculoskeletal: Previous right total hip arthroplasty. Chronic incompletely healed fractures of the right  superior and inferior pubic rami. Severe multilevel spondylosis associated with a convex right thoracolumbar scoliosis. There are chronic bilateral L5 pars defects which contribute to a chronic anterolisthesis and biforaminal narrowing at L5-S1. No acute osseous findings are seen. Unless specific follow-up recommendations are mentioned in the findings or impression sections, no imaging follow-up of any mentioned incidental findings is recommended. IMPRESSION: 1. Mild colonic wall thickening extending from the cecum into the descending colon, consistent with colitis. No evidence of bowel obstruction, perforation or abscess. 2. No other acute abdominopelvic findings. 3. Known metastatic lung cancer at both lung bases, similar to recent chest CT of last month. 4. Moderate-size hiatal hernia. 5. Chronic incompletely healed fractures of the right superior and inferior pubic rami. Chronic bilateral L5 pars defects with associated anterolisthesis and biforaminal narrowing at L5-S1. 6.  Aortic Atherosclerosis (ICD10-I70.0). Electronically Signed   By: Carey Bullocks M.D.   On: 08/18/2022 18:04   DG Chest Port 1 View  Result Date: 08/18/2022 CLINICAL DATA:  Fever, diarrhea.  Sepsis EXAM: PORTABLE CHEST 1 VIEW COMPARISON:  X-ray 01/02/2022 FINDINGS: Borderline enlarged cardiopericardial silhouette with a calcified aorta. Apical pleural thickening on the right-greater-than-left. There is some scarring and fibrotic changes along the medial right lung base. No pneumothorax, effusion or consolidation. There are some subtle areas of nodularity in the left mid midlung. Please correlate with CT scan of 07/21/2022 describing multiple lung nodules. Patient has a history of non-small-cell lung cancer. Overlapping cardiac leads. Diffuse chronic lung changes. Surgical clips in the medial left upper thorax. Osteopenia. Degenerative changes of the shoulders and spine IMPRESSION: Enlarged heart with chronic lung changes. Areas of  scarring and fibrotic changes. Multiple lung nodules as seen on prior CT of 07/21/2018. No consolidation or effusion. Electronically Signed   By: Karen Kays M.D.   On: 08/18/2022 18:00   CT Head Wo Contrast  Result Date: 08/18/2022 CLINICAL DATA:  Neuro deficit, acute, stroke suspected EXAM: CT HEAD WITHOUT CONTRAST TECHNIQUE: Contiguous axial images were obtained from the base of the skull through the vertex without intravenous contrast. RADIATION DOSE REDUCTION: This exam was performed according to the departmental dose-optimization program which includes automated exposure control, adjustment of the mA and/or kV according to patient size and/or use of iterative reconstruction technique. COMPARISON:  MRI head August 01, 2013. FINDINGS: Brain: No evidence of acute infarction, hemorrhage, hydrocephalus, extra-axial collection or mass lesion/mass effect. Patchy white matter hypodensities, nonspecific but compatible with chronic microvascular ischemic disease. Cerebral atrophy. Vascular: No hyperdense vessel. Skull: No acute fracture. Sinuses/Orbits: Clear sinuses.  No acute orbital findings. Other: No mastoid effusions. IMPRESSION: Senescent changes without evidence of acute abnormality. Electronically Signed   By: Feliberto Harts M.D.   On: 08/18/2022 17:59    Microbiology: Recent Results (from the past 240 hour(s))  Gastrointestinal Panel by PCR , Stool  Status: None   Collection Time: 08/18/22  3:19 PM   Specimen: Urine, Clean Catch; Stool  Result Value Ref Range Status   Campylobacter species NOT DETECTED NOT DETECTED Final   Plesimonas shigelloides NOT DETECTED NOT DETECTED Final   Salmonella species NOT DETECTED NOT DETECTED Final   Yersinia enterocolitica NOT DETECTED NOT DETECTED Final   Vibrio species NOT DETECTED NOT DETECTED Final   Vibrio cholerae NOT DETECTED NOT DETECTED Final   Enteroaggregative E coli (EAEC) NOT DETECTED NOT DETECTED Final   Enteropathogenic E coli (EPEC) NOT  DETECTED NOT DETECTED Final   Enterotoxigenic E coli (ETEC) NOT DETECTED NOT DETECTED Final   Shiga like toxin producing E coli (STEC) NOT DETECTED NOT DETECTED Final   Shigella/Enteroinvasive E coli (EIEC) NOT DETECTED NOT DETECTED Final   Cryptosporidium NOT DETECTED NOT DETECTED Final   Cyclospora cayetanensis NOT DETECTED NOT DETECTED Final   Entamoeba histolytica NOT DETECTED NOT DETECTED Final   Giardia lamblia NOT DETECTED NOT DETECTED Final   Adenovirus F40/41 NOT DETECTED NOT DETECTED Final   Astrovirus NOT DETECTED NOT DETECTED Final   Norovirus GI/GII NOT DETECTED NOT DETECTED Final   Rotavirus A NOT DETECTED NOT DETECTED Final   Sapovirus (I, II, IV, and V) NOT DETECTED NOT DETECTED Final    Comment: Performed at [facility], [address] [place], [place] [address]  Resp panel by RT-PCR (RSV, Flu A&B, Covid) Anterior Nasal Swab     Status: None   Collection Time: 08/18/22  3:53 PM   Specimen: Anterior Nasal Swab  Result Value Ref Range Status   SARS Coronavirus 2 by RT PCR NEGATIVE NEGATIVE Final    Comment: (NOTE) SARS-CoV-2 target nucleic acids are NOT DETECTED.  The SARS-CoV-2 RNA is generally detectable in upper respiratory specimens during the acute phase of infection. The lowest concentration of SARS-CoV-2 viral copies this assay can detect is 138 copies/mL. A negative result does not preclude SARS-Cov-2 infection and should not be used as the sole basis for treatment or other patient management decisions. A negative result may occur with  improper specimen collection/handling, submission of specimen other than nasopharyngeal swab, presence of viral mutation(s) within the areas targeted by this assay, and inadequate number of viral copies(<138 copies/mL). A negative result must be combined with clinical observations, patient history, and epidemiological information. The expected result is Negative.  Fact Sheet for Patients:   BloggerCourse.com  Fact Sheet for Healthcare Providers:  SeriousBroker.it  This test is no t yet approved or cleared by the [place] FDA and  has been authorized for detection and/or diagnosis of SARS-CoV-2 by FDA under an Emergency Use Authorization (EUA). This EUA will remain  in effect (meaning this test can be used) for the duration of the COVID-19 declaration under Section 564(b)(1) of the Act, 21 U.S.C.section 360bbb-3(b)(1), unless the authorization is terminated  or revoked sooner.       Influenza A by PCR NEGATIVE NEGATIVE Final   Influenza B by PCR NEGATIVE NEGATIVE Final    Comment: (NOTE) The Xpert Xpress SARS-CoV-2/FLU/RSV plus assay is intended as an aid in the diagnosis of influenza from Nasopharyngeal swab specimens and should not be used as a sole basis for treatment. Nasal washings and aspirates are unacceptable for Xpert Xpress SARS-CoV-2/FLU/RSV testing.  Fact Sheet for Patients: BloggerCourse.com  Fact Sheet for Healthcare Providers: SeriousBroker.it  This test is not yet approved or cleared by the [place] FDA and has been authorized for detection and/or diagnosis of SARS-CoV-2 by  FDA under an Emergency Use Authorization (EUA). This EUA will remain in effect (meaning this test can be used) for the duration of the COVID-19 declaration under Section 564(b)(1) of the Act, 21 U.S.C. section 360bbb-3(b)(1), unless the authorization is terminated or revoked.     Resp Syncytial Virus by PCR NEGATIVE NEGATIVE Final    Comment: (NOTE) Fact Sheet for Patients: BloggerCourse.com  Fact Sheet for Healthcare Providers: SeriousBroker.it  This test is not yet approved or cleared by the [place] FDA and has been authorized for detection and/or diagnosis of SARS-CoV-2 by FDA under an Emergency Use  Authorization (EUA). This EUA will remain in effect (meaning this test can be used) for the duration of the COVID-19 declaration under Section 564(b)(1) of the Act, 21 U.S.C. section 360bbb-3(b)(1), unless the authorization is terminated or revoked.  Performed at [facility], [address] [place], [place] [address]   Blood Culture (routine x 2)     Status: None   Collection Time: 08/18/22  4:05 PM   Specimen: BLOOD  Result Value Ref Range Status   Specimen Description   Final    BLOOD LEFT ANTECUBITAL Performed at [facility], [address] [place], [place] [address]    Special Requests   Final    Blood Culture adequate volume BOTTLES DRAWN AEROBIC AND ANAEROBIC Performed at [facility], [address] [place], [place] [address]    Culture   Final    NO GROWTH 5 DAYS Performed at [facility], [address] [place], [place] [address]    Report Status 08/23/2022 FINAL  Final  Blood Culture (routine x 2)     Status: None   Collection Time: 08/18/22  4:10 PM   Specimen: BLOOD  Result Value Ref Range Status   Specimen Description   Final    BLOOD BLOOD LEFT ARM Performed at [facility], [address] [place], [place] [address]    Special Requests   Final    Blood Culture adequate volume BOTTLES DRAWN AEROBIC AND ANAEROBIC Performed at [facility], [address], [place], [place] [address]    Culture   Final    NO GROWTH 5 DAYS Performed at [facility], [address] [place], [place] [address]    Report Status 08/23/2022 FINAL  Final  C Difficile Quick Screen w PCR reflex     Status: None   Collection Time: 08/18/22  6:12 PM   Specimen: Urine, Clean Catch; Stool  Result Value Ref Range Status   C Diff antigen NEGATIVE NEGATIVE Final   C Diff toxin NEGATIVE NEGATIVE Final   C Diff interpretation No C. difficile detected.  Final    Comment: Performed at [facility], [address], [place], [place] [address]  C Difficile Quick Screen w PCR reflex     Status: None   Collection Time: 08/21/22  3:10 AM   Specimen: STOOL  Result Value Ref Range Status   C Diff antigen NEGATIVE NEGATIVE Final   C Diff toxin NEGATIVE NEGATIVE Final   C Diff interpretation No C. difficile detected.  Final    Comment: Performed at [facility], [address], [place], [place] [address]     Labs: Basic Metabolic Panel: Recent Labs  Lab 08/19/22 0711 08/20/22 0416 08/21/22 1117 08/22/22 0352 08/23/22 0414  NA 128* 129* 130* 128* 128*  K 3.7  3.5 4.1 3.6 4.4  CL 95* 96* 96* 95* 94*  CO2 25 24 24 23 24   GLUCOSE 105* 99 113* 77 80  BUN 17 14 15 15 12   CREATININE 1.01* 1.16* 1.03* 0.94 0.85  CALCIUM 7.7* 7.6* 8.5* 8.2* 8.3*   Liver Function Tests: Recent Labs  Lab 08/18/22 1552 08/19/22 0711 08/21/22 1117 08/22/22 0352 08/23/22 0414  AST 21 17 18 15  36  ALT 15 15 15 13 17   ALKPHOS 63 73 76 59 64  BILITOT 0.4 0.2* 0.5 0.4 0.8  PROT 6.0* 5.6* 5.4* 4.8* 5.5*  ALBUMIN 3.0* 2.8* 2.6* 2.3* 2.7*   Recent Labs  Lab 08/18/22 1552  LIPASE 23   No results for input(s): "AMMONIA" in the last 168 hours. CBC: Recent Labs  Lab 08/20/22 0416 08/20/22 1244 08/21/22 1117 08/22/22 0352 08/23/22 0414  WBC 22.4* 24.6* 15.6* 11.1* 9.5  NEUTROABS  --  21.1* 12.6*  --   --   HGB 8.9* 9.8* 10.1* 9.3* 10.5*  HCT 28.8* 31.6* 32.7* 29.1* 33.7*  MCV 90.3 91.3 90.3 89.5 91.3  PLT 196 239 266 253 62*   Cardiac Enzymes: No results for input(s): "CKTOTAL", "CKMB", "CKMBINDEX", "TROPONINI" in the last 168 hours. BNP: BNP (last 3 results) Recent Labs    12/23/21 1648  BNP 874.2*    ProBNP (last 3 results) No results for input(s): "PROBNP" in the last 8760 hours.  CBG: No results for input(s): "GLUCAP" in the last 168 hours.     Signed:  Rhetta Mura MD   [facility] 08/23/2022, 10:28 AM

## 2022-08-25 ENCOUNTER — Telehealth: Payer: Self-pay

## 2022-08-25 ENCOUNTER — Encounter: Payer: Self-pay | Admitting: *Deleted

## 2022-08-25 ENCOUNTER — Other Ambulatory Visit: Payer: Self-pay

## 2022-08-25 ENCOUNTER — Telehealth: Payer: Self-pay | Admitting: *Deleted

## 2022-08-25 DIAGNOSIS — Z09 Encounter for follow-up examination after completed treatment for conditions other than malignant neoplasm: Secondary | ICD-10-CM

## 2022-08-25 DIAGNOSIS — E871 Hypo-osmolality and hyponatremia: Secondary | ICD-10-CM

## 2022-08-25 NOTE — Transitions of Care (Post Inpatient/ED Visit) (Signed)
08/25/2022  Name: [name] MRN: [id number] DOB: 11/25/1930  Today's TOC FU Call Status: Today's TOC FU Call Status:: Successful TOC FU Call Competed TOC FU Call Complete Date: 08/25/22  Transition Care Management Follow-up Telephone Call Date of Discharge: 08/23/22 Discharge Facility: [facility]) Type of Discharge: Inpatient Admission Primary Inpatient Discharge Diagnosis:: colitis; hyponatremia How have you been since you were released from the hospital?: Better ("I am glad to be home and doing fine so far; my daughter has been staying with me and my caregivers are coming in; I am pretty independent in my own care at home") Any questions or concerns?: No  Items Reviewed: Did you receive and understand the discharge instructions provided?: Yes (thoroughly reviewed with patient who verbalizes good understanding of same) Medications obtained,verified, and reconciled?: Yes (Medications Reviewed) (Full medication reconciliation/ review completed; no concerns or discrepancies identified; confirmed patient obtained/ is taking all newly Rx'd medications as instructed; self-manages medications and denies questions/ concerns around medications today) Any new allergies since your discharge?: No Dietary orders reviewed?: Yes Type of Diet Ordered:: Regular/ "my diet for my stomach" Do you have support at home?: Yes People in Home: alone Name of Support/Comfort Primary Source: Reports resides alone; independent in self-care activities; supportive local daughter assists as/ if needed/ indicated  Medications Reviewed Today: Medications Reviewed Today     Reviewed by Michaela Corner, RN (Registered Nurse) on 08/25/22 at 1132  Med List Status: <None>   Medication Order Taking? Sig Documenting Provider Last Dose Status Informant  acetaminophen (TYLENOL) 500 MG tablet [id number] Yes Take 1 tablet (500 mg total) by mouth every 6 (six) hours as needed (pain). Rhetta Mura, MD Taking  Active   Biotin 1000 MCG tablet [id number] Yes Take 1,000 mcg by mouth 2 (two) times daily. [provider] Taking Active Self, Pharmacy Records  carvedilol (COREG) 3.125 MG tablet [id number] Yes Take 1 tablet (3.125 mg total) by mouth 2 (two) times daily with a meal. Jens Som, Madolyn Frieze, MD Taking Active Self, Pharmacy Records  estradiol (ESTRACE VAGINAL) 0.1 MG/GM vaginal cream [id number] Yes Place one gram vaginally up to three times a week as needed to maintain comfort Copland, [name], MD Taking Active Self, Pharmacy Records  fidaxomicin (DIFICID) 200 MG TABS tablet [id number] Yes Take 1 tablet (200 mg total) by mouth 2 (two) times daily for 7 days. Rhetta Mura, MD Taking Active   gabapentin (NEURONTIN) 300 MG capsule [id number] Yes TAKE 1 CAPSULE BY MOUTH IN THE MORNING, 1 IN THE AFTERNOON, AND 2 AT BEDTIME  Patient taking differently: Take 300 mg by mouth 3 (three) times daily.   Copland, [name], MD Taking Active Self, Pharmacy Records  levothyroxine (SYNTHROID) 75 MCG tablet [id number] Yes TAKE 1 TABLET BY MOUTH DAILY BEFORE BREAKFAST.  Patient taking differently: Take 75 mcg by mouth at bedtime.   Copland, [name], MD Taking Active Self, Pharmacy Records  Multiple Vitamin (MULTIVITAMIN) tablet [id number] Yes Take 1 tablet by mouth daily. [provider] Taking Active Self, Pharmacy Records  Probiotic Product (PROBIOTIC PO) [id number] Yes Take 1 capsule by mouth daily.  [provider] Taking Active Self, Pharmacy Records  sodium chloride 1 g tablet [id number] Yes Take 1 tablet (1 g total) by mouth 2 (two) times daily with a meal. Rhetta Mura, MD Taking Active   traMADol (ULTRAM) 50 MG tablet [id number] Yes TAKE 1/2-1 TABLET BY MOUTH EVERY 8 HOURS AS NEEDED. DO NOT COMBINE WITH OTHER PAIN MEDICATION  Patient taking differently:  Take 50 mg by mouth every 6 (six) hours as needed for severe pain.   Copland, [name], MD Taking Active Self, Pharmacy Records   Med List Note Otis Peak, [facility] 11/11/21 1352): [place] filled at [facility] and Equipment/Supplies: Were Home Health Services Ordered?: No Any new equipment or medical supplies ordered?: No  Functional Questionnaire: Do you need assistance with bathing/showering or dressing?: No (private duty caregiver assists as/ if needed) Do you need assistance with meal preparation?: No (private duty caregiver assists as/ if needed) Do you need assistance with eating?: No Do you have difficulty maintaining continence: No Do you need assistance with getting out of bed/getting out of a chair/moving?: No Do you have difficulty managing or taking your medications?: No (daughter assists/ supervises)  Follow up appointments reviewed: PCP Follow-up appointment confirmed?: No (patient reports PCP contacted her this morning and arranged for labs to be drawn on 08/26/22- patient declines assistance in making HFU with PCP-- states PCP will contact her when she arrives home from traveling; does not want to see covering provider) MD Provider Line Number:[phone] Given: No (verified well-established with current PCP) Specialist Hospital Follow-up appointment confirmed?: No Reason Specialist Follow-Up Not Confirmed: Patient has Specialist Provider Number and will Call for Appointment (encouraged to contact ID specialist to schedule HFU promptly- she verbalizes agreement) Do you need transportation to your follow-up appointment?: No Do you understand care options if your condition(s) worsen?: Yes-patient verbalized understanding  SDOH Interventions Today    Flowsheet Row Most Recent Value  SDOH Interventions   Food Insecurity Interventions Intervention Not Indicated  Transportation Interventions Intervention Not Indicated  [local daughter or private duty caregivers provide transportation]      TOC Interventions Today    Flowsheet Row Most Recent Value  TOC  Interventions   TOC Interventions Discussed/Reviewed TOC Interventions Discussed  [Patient declines need for ongoing/ further care coordination outreach,  no care coordination needs identified at time of TOC call today,  provided my direct contact information should questions/ concerns/ needs arise post-TOC call]      Interventions Today    Flowsheet Row Most Recent Value  Chronic Disease   Chronic disease during today's visit Other  [recurrent colitis with history of C-Diff]  General Interventions   General Interventions Discussed/Reviewed General Interventions Discussed, Durable Medical Equipment (DME), Doctor Visits  Doctor Visits Discussed/Reviewed Doctor Visits Discussed, PCP, Specialist  Durable Medical Equipment (DME) Val Riles that patient uses assistive devices on regular basis, at baseline]  PCP/Specialist Visits Compliance with follow-up visit  Nutrition Interventions   Nutrition Discussed/Reviewed Nutrition Discussed  Pharmacy Interventions   Pharmacy Dicussed/Reviewed Pharmacy Topics Discussed  [Full medication review with updating medication list in EHR per patient report]  Safety Interventions   Safety Discussed/Reviewed Safety Discussed      Caryl Pina, RN, BSN, CCRN Alumnus RN CM Care Coordination/ Transition of Care- [facility] Care Management [phone]: direct office

## 2022-08-25 NOTE — Telephone Encounter (Signed)
-----   Message from Pearline Cables, MD sent at 08/25/2022  1:08 AM EDT ----- Elliot Gault from [place]!  sweet Ms. Swanzetta was discharged over the weekend and needs either an appt with one of [place] early this week- or to come in to have a BMP checked- they are concerned with her sodium.  I am glad to look at her BMP if we cannot find an appt Monday or Tuesday!  thanks so much

## 2022-08-26 ENCOUNTER — Other Ambulatory Visit (INDEPENDENT_AMBULATORY_CARE_PROVIDER_SITE_OTHER): Payer: Medicare PPO

## 2022-08-26 DIAGNOSIS — Z09 Encounter for follow-up examination after completed treatment for conditions other than malignant neoplasm: Secondary | ICD-10-CM

## 2022-08-26 DIAGNOSIS — E871 Hypo-osmolality and hyponatremia: Secondary | ICD-10-CM | POA: Diagnosis not present

## 2022-08-27 ENCOUNTER — Encounter: Payer: Self-pay | Admitting: Family Medicine

## 2022-08-27 ENCOUNTER — Encounter: Payer: Self-pay | Admitting: Internal Medicine

## 2022-08-27 LAB — BASIC METABOLIC PANEL
BUN/Creatinine Ratio: 18 (calc) (ref 6–22)
BUN: 20 mg/dL (ref 7–25)
CO2: 31 mmol/L (ref 20–32)
Calcium: 9.1 mg/dL (ref 8.6–10.4)
Chloride: 95 mmol/L — ABNORMAL LOW (ref 98–110)
Creat: 1.13 mg/dL — ABNORMAL HIGH (ref 0.60–0.95)
Glucose, Bld: 80 mg/dL (ref 65–99)
Potassium: 4.5 mmol/L (ref 3.5–5.3)
Sodium: 135 mmol/L (ref 135–146)

## 2022-08-27 LAB — CALPROTECTIN, FECAL: Calprotectin, Fecal: 1640 ug/g — ABNORMAL HIGH (ref 0–120)

## 2022-08-27 NOTE — Telephone Encounter (Signed)
Pt says she is only taking one tab of the Sodium now she has increase it in her diet regular diet. She wonders if she could skip to only taking it every other day or as needed?   She would like to make an appointment once you return. Would you like for her to be seen in one on the 10 am or 3 pm spots? Or can she wait until around 09/15/22 for an open appointment?

## 2022-08-28 ENCOUNTER — Other Ambulatory Visit: Payer: Self-pay

## 2022-08-28 ENCOUNTER — Other Ambulatory Visit: Payer: Medicare PPO

## 2022-08-28 ENCOUNTER — Telehealth: Payer: Self-pay | Admitting: Family Medicine

## 2022-08-28 DIAGNOSIS — N390 Urinary tract infection, site not specified: Secondary | ICD-10-CM

## 2022-08-28 DIAGNOSIS — R1032 Left lower quadrant pain: Secondary | ICD-10-CM | POA: Diagnosis not present

## 2022-08-28 NOTE — Telephone Encounter (Signed)
Pt has a standing order for urine cx.

## 2022-08-28 NOTE — Telephone Encounter (Signed)
Daughter on the way to drop off specimen, needs order placed for UTI.

## 2022-08-29 ENCOUNTER — Telehealth: Payer: Self-pay | Admitting: Family Medicine

## 2022-08-29 NOTE — Telephone Encounter (Signed)
Prescription Request  08/29/2022  Is this a "Controlled Substance" medicine? Yes  LOV: Visit date not found  What is the name of the medication or equipment?   HYDROcodone-acetaminophen (NORCO/VICODIN) 5-325 MG tablet [[id number]]  DISCONTINUED   Have you contacted your pharmacy to request a refill? No   Which pharmacy would you like this sent to?  [id number], [place] - [address] [place] [address] Phone: [phone] Fax: [phone]  Patient notified that their request is being sent to the clinical staff for review and that they should receive a response within 2 business days.   Please advise at Mobile [phone] (mobile)

## 2022-08-29 NOTE — Telephone Encounter (Signed)
Looks like hospital d/c this at discharge. She has Tramadol on her list. Please advise.

## 2022-08-30 ENCOUNTER — Encounter: Payer: Self-pay | Admitting: Family Medicine

## 2022-08-30 MED ORDER — HYDROCODONE-ACETAMINOPHEN 5-325 MG PO TABS: 1.0000 | ORAL_TABLET | Freq: Two times a day (BID) | ORAL | 0 refills | Status: DC | PRN

## 2022-08-31 ENCOUNTER — Other Ambulatory Visit: Payer: Self-pay | Admitting: Family Medicine

## 2022-08-31 ENCOUNTER — Encounter: Payer: Self-pay | Admitting: Family Medicine

## 2022-08-31 LAB — URINE CULTURE
MICRO NUMBER:: 15188030
SPECIMEN QUALITY:: ADEQUATE

## 2022-08-31 MED ORDER — SULFAMETHOXAZOLE-TRIMETHOPRIM 800-160 MG PO TABS: 1.0000 | ORAL_TABLET | Freq: Two times a day (BID) | ORAL | 0 refills | Status: DC

## 2022-09-03 ENCOUNTER — Encounter (HOSPITAL_COMMUNITY): Payer: Medicare PPO

## 2022-09-04 NOTE — Progress Notes (Unsigned)
[facility] [address] [place], [place] [address] [phone]  Date:  09/10/2022   Name:  [name]   DOB:  [birth year]   MRN:  [id number]  PCP:  Pearline Cables, MD    Chief Complaint: follow up on sodium from Hospital (08/18/22- Colitis/Started Sodium, Chloride, no longer taking Sodium. /Concerns/ questions: Pt says she fell in her closet last Thursday.  R hip has been aching since then. Taking Norco. )   History of Present Illness:  [name] is a 89 y.o. 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  She was recently in the hospital for few days, from July 1 July 6 with concern of colitis.  Originally thought to be C. difficile, but her PCR was negative Was also found to have hyponatremia-she was given salt tablets and diuretic was discontinued, sodium has improved  Hospital Course:  ? Food poisoning versus C. difficile negative  diarrhea [known immunosuppression secondary to discoid lupus Sjogren and rheumatoid]--c. diff neg x 2-imaging neg for source. It was felt that this could be combination of possible food poisoning more likely than just C. difficile We will complete a course of Dificid regardless and defer to Dr. Jerolyn Center with infectious disease with regards to other options such as fecal transplant She is only having 1-2 stools at time of discharge Likely euvolemic hyponatremia/SIADH-serum osmolality 265 associated with sodium 128 as well as hypochloremia points to these Urine studies are pending-she does not want to wait for these results and I think that is okay as long as she has labs soon-patient has had this issue before in the last  hospital visits and I have encouraged her to use salt tablets which we have prescribed, do not drink only free water and also taking solute in addition to liquids when she is drinking fluids I have told her to please follow-up with your primary care physician and get labs in about 2 to 3 days and family understands this Discoid lupus Sjogren's sicca syndrome rheumatoid on abatacept every 28 days-follows with Dr. Corliss Skains resume abatacept in the outpatient setting when able-has not been on this for sever the al months because of recurrent infections Resume gabapentin 300 x 3 times daily -d/c Norco 1 twice daily at time of discharge Gerd Only pepcid--hold PPI indefinitely as this can worsen risk for C. difficile RUL lung cancer 2015 previously Tagrisso discontinued secondary to pneumonitis--progressive cancer Dr. Shirline Frees to treatment team as courtesy-no current oncological requirements HFmEF-echo 40-45% grade 2 diastolic dysfunction 08/21/2022 Continue saline 50 cc/h  Continue Coreg 3.125 twice daily--- hold Lasix 40 daily and would not resume in the outpatient setting given advanced age and risk for volume depletion Normocytic anemia in the setting of prior rectal ulcer, polypectomies in 2023 No real source of bleeding--------------------  We have been in touch since she got home-her sodium was checked on July 9, normal at 135 She also contacted me with concern of possible UTI.  We got a urine culture which was positive for Morganella, I treated her with Septra  A week ago she took a fall at home- she was in her closet and leaned on a bench which fell over, causing her to fall onto her right lower back Her  grandson helped her get up  She has a bruise over her right lower back She is using tramadol and norco as needed for pain  She is able to walk, she does use a walker but this is not unusual.  She notes her back and hip are not painful when she first gets up, but can become uncomfortable the  more she ambulates  Diarrhea is finally better!  She is eating fairly normally  Wt Readings from Last 3 Encounters:  09/10/22 103 lb 12.8 oz (47.1 kg)  08/18/22 103 lb (46.7 kg)  08/11/22 103 lb (46.7 kg)     Patient Active Problem List   Diagnosis Date Noted   Diarrhea of presumed infectious origin 08/22/2022   Colitis 08/18/2022   Acute congestive heart failure (HCC) 12/29/2021   Acute respiratory failure (HCC) 12/24/2021   Acute respiratory failure with hypoxia (HCC) 12/23/2021   Acute on chronic combined systolic and diastolic CHF (congestive heart failure) (HCC) 12/23/2021   Rectal pain 10/10/2020   Acute posthemorrhagic anemia    Rectal bleeding 06/17/2019   Grade II internal hemorrhoids    Rectal ulcer    Popliteal artery occlusion, 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/10/2018   HTN (hypertension) 04/10/2018   Femoral artery pseudo-aneurysm, 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/09/2018   Anemia of chronic disease 02/24/2018   Unstable right hip arthroplasty 01/24/2018   History of revision of total replacement of right hip joint 01/24/2018   Hypovolemic shock (HCC)    Hyperkalemia    Hyponatremia    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   Status post revision of total hip 11/27/2017   Elevated cholesterol 10/11/2015   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HCC) 10/04/2014   Bilateral leg edema 08/01/2014   Elevated BP 08/01/2014   Rheumatoid arthritis involving multiple joints (HCC) 05/30/2014   Status post total replacement of right hip 04/28/2014   Long-term use of high-risk medication 11/11/2013   Symptomatic anemia 11/11/2013   Neuropathic pain 07/07/2013   Constipation due to pain medication 07/07/2013   Protein-calorie malnutrition, severe (HCC) 06/08/2013   Lung cancer, Right upper lobe 05/08/2013   Sciatica of right side 08/13/2011   Osteoporosis 03/07/2010   PARESTHESIA 03/07/2010   Low back pain 03/16/2009   CT, CHEST, ABNORMAL 12/18/2008   ABNORMAL ECHOCARDIOGRAM 12/14/2008   SJOGREN'S  SYNDROME 11/29/2008   HYPOGLYCEMIA 06/29/2006   RAYNAUD'S DISEASE 06/29/2006    Past Medical History:  Diagnosis Date   Arthralgia of multiple joints    followed by dr Sharmon Revere   Arthritis    Cardiomyopathy (HCC)    Chronic constipation    Chronic inflammatory arthritis    rhemotolgist-  dr a. Sharmon Revere ([facility])   Dry eyes    eye drops used    GERD (gastroesophageal reflux disease)    H/O discoid lupus erythematosus    Hiatal hernia    History of colon polyps    Hypothyroidism    Iron deficiency anemia    LBBB (left bundle branch block) 2010   Mitchell's disease (erythromelalgia) [facility])    neurologist-  dr patel   Nocturia    Non-small cell cancer of right lung [facility]) surgeon-- dr gerhardt/  oncologist-  dr Arbutus Ped--- per lov notes no recurrence/   11-18-2017 per pt denies any symptoms   dx 2015--  Stage IIA (T2b,N0,M0) , +EGFR  mutation in exon 21, non-small cell adenocarcinoma right upper lobe---  s/p  Right upper lobectomy , right middley wedge resection and node dissection---  no chemo or radiation therapy  OA (osteoarthritis)    hands   Osteoporosis    PONV (postoperative nausea and vomiting)    likes phenergan   Raynaud's phenomenon 1965   Renal insufficiency    Rheumatoid arthritis (HCC)    Sciatica    Scoliosis    Sjogren's syndrome [facility])     Past Surgical History:  Procedure Laterality Date   ANTERIOR HIP REVISION Right 11/27/2017   Procedure: RIGHT HIP ACETABULAR REVISION;  Surgeon: Kathryne Hitch, MD;  Location: [facility] ORS;  Service: Orthopedics;  Laterality: Right;   ANTERIOR HIP REVISION Right 01/24/2018   Procedure: OPEN REDUCTION OF DISLOCATED ANTERIOR HIP WITH REVISION OF LINER AND HIP BALL;  Surgeon: Kathryne Hitch, MD;  Location: [facility]S;  Service: Orthopedics;  Laterality: Right;   APPENDECTOMY  1950s   BIOPSY  04/14/2018   Procedure: BIOPSY;  Surgeon: Benancio Deeds, MD;  Location: [facility] ENDOSCOPY;  Service:  Gastroenterology;;   BIOPSY  04/16/2018   Procedure: BIOPSY;  Surgeon: Lemar Lofty., MD;  Location: [facility] ENDOSCOPY;  Service: Gastroenterology;;   CARDIOVASCULAR STRESS TEST  12/2008    mild fixed basal to mid septal perfusion defect felt likely due to artifact from LBBB, no ischemia, EF 58%   COLONOSCOPY     COLONOSCOPY WITH PROPOFOL N/A 04/16/2018   Procedure: COLONOSCOPY WITH PROPOFOL;  Surgeon: Lemar Lofty., MD;  Location[facility] ENDOSCOPY;  Service: Gastroenterology;  Laterality: N/A;   ESOPHAGOGASTRODUODENOSCOPY (EGD) WITH PROPOFOL N/A 04/14/2018   Procedure: ESOPHAGOGASTRODUODENOSCOPY (EGD) WITH PROPOFOL;  Surgeon: Benancio Deeds, MD;  Location: [facility] ENDOSCOPY;  Service: Gastroenterology;  Laterality: N/A;   FEMORAL-POPLITEAL BYPASS GRAFT Right 04/10/2018   Procedure: REPAIR RIGHT FEMORAL ARTERY PSEUDOANEURYSM, RETROPERITONEAL EXPOSURE OF ILIAC ARTERY, RIGHT POPLITEAL EMBOLECTOMY;  Surgeon: Chuck Hint, MD;  Location: [facility] OR;  Service: Vascular;  Laterality: Right;   FLEXIBLE SIGMOIDOSCOPY N/A 06/17/2019   Procedure: FLEXIBLE SIGMOIDOSCOPY;  Surgeon: Shellia Cleverly, DO;  Location: [facility] ENDOSCOPY;  Service: Gastroenterology;  Laterality: N/A;   HEMOSTASIS CLIP PLACEMENT  06/17/2019   Procedure: HEMOSTASIS CLIP PLACEMENT;  Surgeon: Shellia Cleverly, DO;  Location: [facility] ENDOSCOPY;  Service: Gastroenterology;;   LYMPH NODE DISSECTION Right 06/07/2013   Procedure: LYMPH NODE DISSECTION;  Surgeon: Delight Ovens, MD;  Location[facility] OR;  Service: Thoracic;  Laterality: Right;   PATCH ANGIOPLASTY Right 04/10/2018   Procedure: PATCH  ANGIOPLASTY OF RIGHT FEMORAL ARTERY USING BOVINE PATCH, PATCH ANGIOPLASTY OF RIGHT POPLITEAL ARTERY USING BOVINE PATCH;  Surgeon: Chuck Hint, MD;  Location: [facility] OR;  Service: Vascular;  Laterality: Right;   SCHLEROTHERAPY  06/17/2019   Procedure: Theresia Majors OF VARICES;  Surgeon: Shellia Cleverly, DO;  Location: [facility] ENDOSCOPY;   Service: Gastroenterology;;   THORACIC SYMPATHETECTOMY  1965   "large incision from chest to up to shoulder, the nerves were tied together, for raynaud's   THORACOTOMY  06/07/2013   Procedure: MINI/LIMITED THORACOTOMY; right middle lobe wedge resection;  Surgeon: Delight Ovens, MD;  Location: [facility] OR;  Service: Thoracic;;   TONSILLECTOMY  child   TOTAL ABDOMINAL HYSTERECTOMY  1980's    W/ BSO   TOTAL HIP ARTHROPLASTY Right 04/28/2014   Procedure: RIGHT TOTAL HIP ARTHROPLASTY ANTERIOR APPROACH;  Surgeon: Kathryne Hitch, MD;  Location: [facility]S;  Service: Orthopedics;  Laterality: Right;   TRANSTHORACIC ECHOCARDIOGRAM  12/11/2008   ef 45-50%, grade 1 diastolic dysfunction/  mild LAE/  mild AR and MR/  trivial TR   VIDEO ASSISTED THORACOSCOPY (VATS)/WEDGE RESECTION Right 06/07/2013  Procedure: VIDEO ASSISTED THORACOSCOPY (VATS)/right upper lobectomy, On Q;  Surgeon: Delight Ovens, MD;  Location: [facility] OR;  Service: Thoracic;  Laterality: Right;   VIDEO BRONCHOSCOPY N/A 06/07/2013   Procedure: VIDEO BRONCHOSCOPY;  Surgeon: Delight Ovens, MD;  Location: [facility] OR;  Service: Thoracic;  Laterality: N/A;   VIDEO BRONCHOSCOPY WITH ENDOBRONCHIAL NAVIGATION N/A 05/04/2013   Procedure: VIDEO BRONCHOSCOPY WITH ENDOBRONCHIAL NAVIGATION;  Surgeon: Delight Ovens, MD;  Location: [facility] OR;  Service: Thoracic;  Laterality: N/A;    Social History   Tobacco Use   Smoking status: Never    Passive exposure: Past   Smokeless tobacco: Never  Vaping Use   Vaping status: Never Used  Substance Use Topics   Alcohol use: Not Currently   Drug use: Never    Family History  Problem Relation Age of Onset   Coronary artery disease Father    Colon cancer Father    Diabetes Father    Cancer Father        colon   Other Mother 71       MVA   Healthy Sister    Healthy Brother    Healthy Daughter    Hypothyroidism Daughter    Other Brother        killed in war   Pneumonia Sister    Healthy Daughter     Esophageal cancer Neg Hx    Kidney disease Neg Hx    Liver disease Neg Hx     Allergies  Allergen Reactions   Amlodipine Rash   Prochlorperazine Edisylate Anaphylaxis    Compazine--- tongue swells and rash   Aspirin Other (See Comments)    Nose bleeds. Cannot take NSAIDS    Cymbalta [Duloxetine Hcl] Diarrhea, Nausea And Vomiting and Other (See Comments)    Increased blood pressure   Pamelor [Nortriptyline Hcl] Diarrhea and Nausea Only    Increased Heart rate and BP    Medication list has been reviewed and updated.  Current Outpatient Medications on File Prior to Visit  Medication Sig Dispense Refill   acetaminophen (TYLENOL) 500 MG tablet Take 1 tablet (500 mg total) by mouth every 6 (six) hours as needed (pain). 30 tablet 0   Biotin 1000 MCG tablet Take 1,000 mcg by mouth 2 (two) times daily.     carvedilol (COREG) 3.125 MG tablet Take 1 tablet (3.125 mg total) by mouth 2 (two) times daily with a meal. 180 tablet 3   estradiol (ESTRACE VAGINAL) 0.1 MG/GM vaginal cream Place one gram vaginally up to three times a week as needed to maintain comfort 42.5 g 12   gabapentin (NEURONTIN) 300 MG capsule TAKE 1 CAPSULE BY MOUTH IN THE MORNING, 1 IN THE AFTERNOON, AND 2 AT BEDTIME (Patient taking differently: Take 300 mg by mouth 3 (three) times daily.) 360 capsule 1   HYDROcodone-acetaminophen (NORCO/VICODIN) 5-325 MG tablet Take 1 tablet by mouth 2 (two) times daily as needed for moderate pain. 30 tablet 0   levothyroxine (SYNTHROID) 75 MCG tablet TAKE 1 TABLET BY MOUTH DAILY BEFORE BREAKFAST. (Patient taking differently: Take 75 mcg by mouth at bedtime.) 90 tablet 1   Multiple Vitamin (MULTIVITAMIN) tablet Take 1 tablet by mouth daily.     Probiotic Product (PROBIOTIC PO) Take 1 capsule by mouth daily.      sulfamethoxazole-trimethoprim (BACTRIM DS) 800-160 MG tablet Take 1 tablet by mouth 2 (two) times daily. 10 tablet 0   traMADol (ULTRAM) 50 MG tablet TAKE 1/2-1 TABLET BY MOUTH EVERY 8  HOURS AS NEEDED. DO NOT COMBINE WITH OTHER PAIN MEDICATION (Patient taking differently: Take 50 mg by mouth every 6 (six) hours as needed for severe pain.) 60 tablet 0   No current facility-administered medications on file prior to visit.    Review of Systems:  As per HPI- otherwise negative.    Physical Examination: Vitals:   09/10/22 1011  BP: 118/60  Pulse: 78  Resp: 18  Temp: 97.6 F (36.4 C)  SpO2: 98%   Vitals:   09/10/22 1011  Weight: 103 lb 12.8 oz (47.1 kg)   Body mass index is 20.27 kg/m. Ideal Body Weight:    GEN: no acute distress.  Petite lady, looks well.  Ambulating with walker.  Accompanied today by her grandson HEENT: Atraumatic, Normocephalic.  Ears and Nose: No external deformity. CV: RRR, No M/G/R. No JVD. No thrill. No extra heart sounds. PULM: CTA B, no wheezes, crackles, rhonchi. No retractions. No resp. distress. No accessory muscle use. ABD: S, NT, ND, EXTR: No c/c/e PSYCH: Normally interactive. Conversant.  There is bruising present over the right sided iliac crest.  I am able to manipulate the right hip without any significant discomfort.  Assessment and Plan: Hyponatremia - Plan: Comprehensive metabolic panel  Colitis - Plan: CBC  Acquired hypothyroidism - Plan: TSH  Nausea - Plan: ondansetron (ZOFRAN) 4 MG tablet  Gastroesophageal reflux disease, unspecified whether esophagitis present - Plan: famotidine (PEPCID) 20 MG tablet  Patient seen today for follow-up.  As above, she recently suffered an acute diarrheal illness, possibly C. difficile though this is not certain.  This is complicated by hyponatremia.  She is overall significantly improved  Follow-up today on her sodium, CBC, thyroid  Provided Zofran she can use as needed for nausea  Patient notes her PPI was stopped due to concern of possible contribution to C. difficile.  She is currently taking famotidine, would like a prescription in hopes of cost savings  Signed Abbe Amsterdam, MD  Received labs as below, message to patient. Results for orders placed or performed in visit on 09/10/22  CBC  Result Value Ref Range   WBC 4.4 4.0 - 10.5 K/uL   RBC 4.00 3.87 - 5.11 Mil/uL   Platelets 251.0 150.0 - 400.0 K/uL   Hemoglobin 11.4 (L) 12.0 - 15.0 g/dL   HCT 13.2 (L) 44.0 - 10.2 %   MCV 87.9 78.0 - 100.0 fl   MCHC 32.4 30.0 - 36.0 g/dL   RDW 72.5 (H) 36.6 - 44.0 %  Comprehensive metabolic panel  Result Value Ref Range   Sodium 131 (L) 135 - 145 mEq/L   Potassium 5.2 No hemolysis seen (H) 3.5 - 5.1 mEq/L   Chloride 92 (L) 96 - 112 mEq/L   CO2 30 19 - 32 mEq/L   Glucose, Bld 81 70 - 99 mg/dL   BUN 29 (H) 6 - 23 mg/dL   Creatinine, Ser 3.47 (H) 0.40 - 1.20 mg/dL   Total Bilirubin 0.4 0.2 - 1.2 mg/dL   Alkaline Phosphatase 51 39 - 117 U/L   AST 28 0 - 37 U/L   ALT 21 0 - 35 U/L   Total Protein 7.0 6.0 - 8.3 g/dL   Albumin 4.4 3.5 - 5.2 g/dL   GFR 42.59 (L) >56.38 mL/min   Calcium 9.9 8.4 - 10.5 mg/dL  TSH  Result Value Ref Range   TSH 2.53 0.35 - 5.50 uIU/mL   *Note: Due to a large number of results and/or encounters for the  requested time period, some results have not been displayed. A complete set of results can be found in Results Review.

## 2022-09-04 NOTE — Patient Instructions (Incomplete)
I am so glad to see you feeling better!  Please let me know if your lower back/ hip is not continuing to get better- we can always get some x-rays I will be in touch with your labs asap   Assuming all is ok please see me in 3-4 months   Pepcid rx for GERD and ondansetron for nausea sent to your drug store

## 2022-09-10 ENCOUNTER — Encounter: Payer: Self-pay | Admitting: Family Medicine

## 2022-09-10 ENCOUNTER — Ambulatory Visit: Payer: Medicare PPO | Admitting: Family Medicine

## 2022-09-10 VITALS — BP 118/60 | HR 78 | Temp 97.6°F | Resp 18 | Wt 103.8 lb

## 2022-09-10 DIAGNOSIS — R110 Nausea: Secondary | ICD-10-CM | POA: Diagnosis not present

## 2022-09-10 DIAGNOSIS — E039 Hypothyroidism, unspecified: Secondary | ICD-10-CM | POA: Diagnosis not present

## 2022-09-10 DIAGNOSIS — K529 Noninfective gastroenteritis and colitis, unspecified: Secondary | ICD-10-CM | POA: Diagnosis not present

## 2022-09-10 DIAGNOSIS — E871 Hypo-osmolality and hyponatremia: Secondary | ICD-10-CM | POA: Diagnosis not present

## 2022-09-10 DIAGNOSIS — K219 Gastro-esophageal reflux disease without esophagitis: Secondary | ICD-10-CM

## 2022-09-10 LAB — COMPREHENSIVE METABOLIC PANEL
ALT: 21 U/L (ref 0–35)
AST: 28 U/L (ref 0–37)
Albumin: 4.4 g/dL (ref 3.5–5.2)
Alkaline Phosphatase: 51 U/L (ref 39–117)
BUN: 29 mg/dL — ABNORMAL HIGH (ref 6–23)
CO2: 30 mEq/L (ref 19–32)
Calcium: 9.9 mg/dL (ref 8.4–10.5)
Chloride: 92 mEq/L — ABNORMAL LOW (ref 96–112)
Creatinine, Ser: 1.27 mg/dL — ABNORMAL HIGH (ref 0.40–1.20)
GFR: 37.41 mL/min — ABNORMAL LOW (ref >60.00)
Glucose, Bld: 81 mg/dL (ref 70–99)
Potassium: 5.2 mEq/L — ABNORMAL HIGH (ref 3.5–5.1)
Sodium: 131 mEq/L — ABNORMAL LOW (ref 135–145)
Total Bilirubin: 0.4 mg/dL (ref 0.2–1.2)
Total Protein: 7 g/dL (ref 6.0–8.3)

## 2022-09-10 LAB — CBC
HCT: 35.1 % — ABNORMAL LOW (ref 36.0–46.0)
Hemoglobin: 11.4 g/dL — ABNORMAL LOW (ref 12.0–15.0)
MCHC: 32.4 g/dL (ref 30.0–36.0)
MCV: 87.9 fl (ref 78.0–100.0)
Platelets: 251 10*3/uL (ref 150.0–400.0)
RBC: 4 Mil/uL (ref 3.87–5.11)
RDW: 16.3 % — ABNORMAL HIGH (ref 11.5–15.5)
WBC: 4.4 10*3/uL (ref 4.0–10.5)

## 2022-09-10 LAB — TSH: TSH: 2.53 u[IU]/mL (ref 0.35–5.50)

## 2022-09-10 MED ORDER — FAMOTIDINE 20 MG PO TABS
20.0000 mg | ORAL_TABLET | Freq: Every day | ORAL | 3 refills | Status: DC
Start: 2022-09-10 — End: 2023-08-14

## 2022-09-10 MED ORDER — ONDANSETRON HCL 4 MG PO TABS: 4.0000 mg | ORAL_TABLET | Freq: Three times a day (TID) | ORAL | 1 refills | Status: DC | PRN

## 2022-09-12 ENCOUNTER — Encounter (HOSPITAL_COMMUNITY): Payer: Medicare PPO

## 2022-09-20 ENCOUNTER — Other Ambulatory Visit: Payer: Self-pay | Admitting: Family Medicine

## 2022-09-20 DIAGNOSIS — M25512 Pain in left shoulder: Secondary | ICD-10-CM

## 2022-09-22 ENCOUNTER — Encounter: Payer: Self-pay | Admitting: Internal Medicine

## 2022-09-22 ENCOUNTER — Ambulatory Visit: Payer: Medicare PPO | Admitting: Internal Medicine

## 2022-09-22 ENCOUNTER — Other Ambulatory Visit: Payer: Self-pay

## 2022-09-22 VITALS — BP 134/79 | HR 82 | Temp 97.6°F | Ht 60.0 in | Wt 102.0 lb

## 2022-09-22 DIAGNOSIS — R197 Diarrhea, unspecified: Secondary | ICD-10-CM | POA: Diagnosis not present

## 2022-09-22 DIAGNOSIS — Z8619 Personal history of other infectious and parasitic diseases: Secondary | ICD-10-CM

## 2022-09-22 NOTE — Progress Notes (Signed)
RFV: history of cdifficile Patient ID: [name], female   DOB: 04-15-1930, 89 y.o.   MRN: [id number]  HPI  She was hospitalized in July for dehydration, diarrhea, and hyponatremia. Initially it was suspected to have recurrence of cdifficile, however her testing was negative. At time of discharge she had 2 stools per day. She was discharged on a 10 days course of fidaxomicin followed by Q3 days pulse doses for 10 courses. She has finished 6 of the Q3 day dosing as of 8/3.  She has episodic diarrhea-constipation - IBS like process. She is using miralax daily. 1 full scoop to minimize constipation.  Here with her daughter.  Outpatient Encounter Medications as of 09/22/2022  Medication Sig   acetaminophen (TYLENOL) 500 MG tablet Take 1 tablet (500 mg total) by mouth every 6 (six) hours as needed (pain).   Biotin 1000 MCG tablet Take 1,000 mcg by mouth 2 (two) times daily.   carvedilol (COREG) 3.125 MG tablet Take 1 tablet (3.125 mg total) by mouth 2 (two) times daily with a meal.   estradiol (ESTRACE VAGINAL) 0.1 MG/GM vaginal cream Place one gram vaginally up to three times a week as needed to maintain comfort   famotidine (PEPCID) 20 MG tablet Take 1 tablet (20 mg total) by mouth daily.   gabapentin (NEURONTIN) 300 MG capsule TAKE 1 CAPSULE BY MOUTH IN THE MORNING, 1 IN THE AFTERNOON, AND 2 AT BEDTIME (Patient taking differently: Take 300 mg by mouth 3 (three) times daily.)   HYDROcodone-acetaminophen (NORCO/VICODIN) 5-325 MG tablet Take 1 tablet by mouth 2 (two) times daily as needed for moderate pain.   levothyroxine (SYNTHROID) 75 MCG tablet TAKE 1 TABLET BY MOUTH DAILY BEFORE BREAKFAST. (Patient taking differently: Take 75 mcg by mouth at bedtime.)   Multiple Vitamin (MULTIVITAMIN) tablet Take 1 tablet by mouth daily.   ondansetron (ZOFRAN) 4 MG tablet Take 1 tablet (4 mg total) by mouth every 8 (eight) hours as needed for nausea or vomiting.   Probiotic Product (PROBIOTIC PO)  Take 1 capsule by mouth daily.    traMADol (ULTRAM) 50 MG tablet TAKE 1/2-1 TABLET BY MOUTH EVERY 8 HOURS AS NEEDED. DO NOT COMBINE WITH OTHER PAIN MEDICATION (Patient taking differently: Take 50 mg by mouth every 6 (six) hours as needed for severe pain.)   sulfamethoxazole-trimethoprim (BACTRIM DS) 800-160 MG tablet Take 1 tablet by mouth 2 (two) times daily. (Patient not taking: Reported on 09/22/2022)   No facility-administered encounter medications on file as of 09/22/2022.     Patient Active Problem List   Diagnosis Date Noted   Diarrhea of presumed infectious origin 08/22/2022   Colitis 08/18/2022   Acute congestive heart failure (HCC) 12/29/2021   Acute respiratory failure (HCC) 12/24/2021   Acute respiratory failure with hypoxia (HCC) 12/23/2021   Acute on chronic combined systolic and diastolic CHF (congestive heart failure) (HCC) 12/23/2021   Rectal pain 10/10/2020   Acute posthemorrhagic anemia    Rectal bleeding 06/17/2019   Grade II internal hemorrhoids    Rectal ulcer    Popliteal artery occlusion, 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/10/2018   HTN (hypertension) 04/10/2018   Femoral artery pseudo-aneurysm, 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/09/2018   Anemia of chronic disease 02/24/2018   Unstable right hip arthroplasty 01/24/2018   History of revision of total replacement of right hip joint 01/24/2018   Hypovolemic shock (HCC)    Hyperkalemia    Hyponatremia    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   Status post revision of total hip  11/27/2017   Elevated cholesterol 10/11/2015   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HCC) 10/04/2014   Bilateral leg edema 08/01/2014   Elevated BP 08/01/2014   Rheumatoid arthritis involving multiple joints (HCC) 05/30/2014   Status post total replacement of right hip 04/28/2014   Long-term use of high-risk medication 11/11/2013   Symptomatic anemia 11/11/2013   Neuropathic pain 07/07/2013   Constipation due to pain medication 07/07/2013   Protein-calorie malnutrition,  severe (HCC) 06/08/2013   Lung cancer, Right upper lobe 05/08/2013   Sciatica of right side 08/13/2011   Osteoporosis 03/07/2010   PARESTHESIA 03/07/2010   Low back pain 03/16/2009   CT, CHEST, ABNORMAL 12/18/2008   ABNORMAL ECHOCARDIOGRAM 12/14/2008   SJOGREN'S SYNDROME 11/29/2008   HYPOGLYCEMIA 06/29/2006   RAYNAUD'S DISEASE 06/29/2006     Health Maintenance Due  Topic Date Due   Zoster Vaccines- Shingrix (1 of 2) 11/18/1949   COVID-19 Vaccine (5 - 2023-24 season) 10/18/2021   Medicare Annual Wellness (AWV)  05/08/2022   INFLUENZA VACCINE  09/18/2022     Review of Systems +hemorrhoids. 12 point ros is negative Physical Exam   BP 134/79   Pulse 82   Temp 97.6 F (36.4 C) (Temporal)   Ht 5' (1.524 m)   Wt 102 lb (46.3 kg)   SpO2 96%   BMI 19.92 kg/m    Physical Exam  Constitutional:  oriented to person, place, and time. appears well-developed and well-nourished. No distress.  HENT: [place]/AT, PERRLA, no scleral icterus Mouth/Throat: Oropharynx is clear and moist. No oropharyngeal exudate.  Cardiovascular: Normal rate, regular rhythm and normal heart sounds. Exam reveals no gallop and no friction rub.  No murmur heard.  Pulmonary/Chest: Effort normal and breath sounds normal. No respiratory distress.  has no wheezes.  Neck = supple, no nuchal rigidity Abdominal: Soft. Bowel sounds are normal.  exhibits no distension. There is no tenderness.  Lymphadenopathy: no cervical adenopathy. No axillary adenopathy Neurological: alert and oriented to person, place, and time.  Skin: Skin is warm and dry. No rash noted. No erythema.  Psychiatric: a normal mood and affect.  behavior is normal.    CBC Lab Results  Component Value Date   WBC 4.4 09/10/2022   RBC 4.00 09/10/2022   HGB 11.4 (L) 09/10/2022   HCT 35.1 (L) 09/10/2022   PLT 251.0 09/10/2022   MCV 87.9 09/10/2022   MCH 28.5 08/23/2022   MCHC 32.4 09/10/2022   RDW 16.3 (H) 09/10/2022   LYMPHSABS 1.1 08/21/2022    MONOABS 0.8 08/21/2022   EOSABS 0.2 08/21/2022    BMET Lab Results  Component Value Date   NA 131 (L) 09/10/2022   K 5.2 No hemolysis seen (H) 09/10/2022   CL 92 (L) 09/10/2022   CO2 30 09/10/2022   GLUCOSE 81 09/10/2022   BUN 29 (H) 09/10/2022   CREATININE 1.27 (H) 09/10/2022   CALCIUM 9.9 09/10/2022   GFRNONAA >60 08/23/2022   GFRAA 60 05/21/2020      Assessment and Plan  Stop pulse dosing of fidaxomicin. Has 6 tabs left.  We will plan to give her stool kit should she have recurrence of diarrhea. Instructed to come back to clinic with specimen for testing should she have recurrence. We will review her symptoms to decide if need to start fidaxomicin empirically  Should she have another course of cdiffcile, then we will pursue vowst at completion of next cdiff treatment course.  Rtc in 2 months to check in on her

## 2022-10-01 ENCOUNTER — Ambulatory Visit (HOSPITAL_COMMUNITY)
Admission: RE | Admit: 2022-10-01 | Discharge: 2022-10-01 | Disposition: A | Discharge status: Home / Self Care | Payer: Medicare PPO | Source: Ambulatory Visit | Attending: Rheumatology | Admitting: Rheumatology

## 2022-10-01 DIAGNOSIS — Z111 Encounter for screening for respiratory tuberculosis: Secondary | ICD-10-CM | POA: Diagnosis not present

## 2022-10-01 DIAGNOSIS — Z79899 Other long term (current) drug therapy: Secondary | ICD-10-CM | POA: Diagnosis not present

## 2022-10-01 DIAGNOSIS — M0609 Rheumatoid arthritis without rheumatoid factor, multiple sites: Secondary | ICD-10-CM | POA: Diagnosis not present

## 2022-10-01 DIAGNOSIS — M810 Age-related osteoporosis without current pathological fracture: Secondary | ICD-10-CM

## 2022-10-01 MED ORDER — DIPHENHYDRAMINE HCL 25 MG PO CAPS: 25.0000 mg | ORAL_CAPSULE | ORAL | Status: DC

## 2022-10-01 MED ORDER — DENOSUMAB 60 MG/ML ~~LOC~~ SOSY
PREFILLED_SYRINGE | SUBCUTANEOUS | Status: AC
  Administered 2022-10-01: 60 mg via SUBCUTANEOUS
  Filled 2022-10-01: qty 1

## 2022-10-01 MED ORDER — ACETAMINOPHEN 325 MG PO TABS: 650.0000 mg | ORAL_TABLET | ORAL | Status: DC

## 2022-10-01 MED ORDER — DENOSUMAB 60 MG/ML ~~LOC~~ SOSY: 60.0000 mg | PREFILLED_SYRINGE | Freq: Once | SUBCUTANEOUS | Status: AC

## 2022-10-01 MED ORDER — SODIUM CHLORIDE 0.9 % IV SOLN
500.0000 mg | INTRAVENOUS | Status: DC
  Administered 2022-10-01: 500 mg via INTRAVENOUS
  Filled 2022-10-01: qty 20

## 2022-10-03 LAB — QUANTIFERON-TB GOLD PLUS (RQFGPL)
QuantiFERON Mitogen Value: >10 [IU]/mL
QuantiFERON Nil Value: 0 [IU]/mL
QuantiFERON TB1 Ag Value: 0.01 [IU]/mL
QuantiFERON TB2 Ag Value: 0 [IU]/mL

## 2022-10-03 LAB — QUANTIFERON-TB GOLD PLUS: QuantiFERON-TB Gold Plus: NEGATIVE

## 2022-10-03 NOTE — Progress Notes (Signed)
TB Gold negative

## 2022-10-06 ENCOUNTER — Ambulatory Visit: Payer: Medicare PPO | Admitting: Rheumatology

## 2022-10-06 ENCOUNTER — Other Ambulatory Visit: Payer: Medicare PPO

## 2022-10-06 ENCOUNTER — Other Ambulatory Visit: Payer: Self-pay

## 2022-10-06 ENCOUNTER — Telehealth: Payer: Self-pay

## 2022-10-06 DIAGNOSIS — A09 Infectious gastroenteritis and colitis, unspecified: Secondary | ICD-10-CM

## 2022-10-06 NOTE — Telephone Encounter (Signed)
[name] called, reports she's experienced a recurrence of her c.diff symptoms including loose stool and "white slimy stuff." Her daughter is going to be dropping off a stool sample today. Dr. Drue Second notified via secure chat per patient request.   [name], RN

## 2022-10-06 NOTE — Telephone Encounter (Signed)
Dr. Drue Second would like to also do a PCR test. Per lab, someone will need to call tomorrow 8/20 to request add-on testing.   [facility]ia[facility] [phone] Test: Clostridium difficile Toxin/GDH with Reflex to PCR [facility] Test Code: 73220  [name], RN

## 2022-10-07 ENCOUNTER — Telehealth: Payer: Self-pay | Admitting: *Deleted

## 2022-10-07 ENCOUNTER — Other Ambulatory Visit: Payer: Self-pay | Admitting: Internal Medicine

## 2022-10-07 ENCOUNTER — Encounter (INDEPENDENT_AMBULATORY_CARE_PROVIDER_SITE_OTHER): Payer: Medicare PPO | Admitting: Family Medicine

## 2022-10-07 ENCOUNTER — Telehealth: Payer: Self-pay

## 2022-10-07 DIAGNOSIS — R350 Frequency of micturition: Secondary | ICD-10-CM | POA: Diagnosis not present

## 2022-10-07 MED ORDER — VANCOMYCIN HCL 125 MG PO CAPS: 125.0000 mg | ORAL_CAPSULE | Freq: Four times a day (QID) | ORAL | 1 refills | Status: DC

## 2022-10-07 NOTE — Telephone Encounter (Signed)
Patient contacted the office and requested a call back from [facility]. She has a question she would like to discuss with her. Call back number [phone].

## 2022-10-07 NOTE — Telephone Encounter (Signed)
Left voicemail with [facility] liaison to add on test. Requested call back to confirm if they are able to add on test.  Juanita Laster, RMA

## 2022-10-07 NOTE — Telephone Encounter (Signed)
Mrs. [name] called [facility] pharmacist to address concerns for medications potentially contributing to C. Diff recurrences. Concerns for Orencia and Prolia potentially exacerbating/contributing to treatment failure. She reports that she has been receiving these medications for several years. Starting within the last year, the Mrs. [name] has had 4 total occurrences of C. Diff infection. Discussed with Mrs. [name] that Prolia and [name] can increase risk of infection. I have advised her to speak with her Rheumatologist when she is able to discuss the risks/benefits of continuing or stopping these agents in light of recurrent CDI. Mrs. [name] endorsed understanding, and will discuss with her Rheumatologist at next visit.  Lora Paula, PharmD PGY-2 Infectious Diseases Pharmacy Resident 10/07/2022 3:52 PM

## 2022-10-07 NOTE — Telephone Encounter (Signed)
Discussed with Mrs. Popke that Dr. Drue Second will follow up with her regarding C diff testing yesterday.  Lora Paula, PharmD PGY-2 Infectious Diseases Pharmacy Resident 10/07/2022 3:57 PM

## 2022-10-07 NOTE — Telephone Encounter (Signed)
Spoke with [facility] liaison regarding adding PCR testing. Per liaison original order will default to PCR testing if toxin is detected on initial screening. If not toxin is present PCR testing will not be done. Juanita Laster, RMA

## 2022-10-08 ENCOUNTER — Telehealth: Payer: Self-pay | Admitting: Family Medicine

## 2022-10-08 ENCOUNTER — Other Ambulatory Visit: Payer: Medicare PPO

## 2022-10-08 ENCOUNTER — Encounter: Payer: Self-pay | Admitting: Family Medicine

## 2022-10-08 DIAGNOSIS — R350 Frequency of micturition: Secondary | ICD-10-CM | POA: Diagnosis not present

## 2022-10-08 MED ORDER — SULFAMETHOXAZOLE-TRIMETHOPRIM 800-160 MG PO TABS
1.0000 | ORAL_TABLET | Freq: Two times a day (BID) | ORAL | 0 refills | Status: DC
Start: 2022-10-08 — End: 2022-11-10

## 2022-10-08 NOTE — Telephone Encounter (Signed)
I called back and left message for [name] thanking her for the information.  Will reach out to Dr. Drue Second concerning [name] and Prolia infusions

## 2022-10-08 NOTE — Telephone Encounter (Signed)
Please see the MyChart message reply(ies) for my assessment and plan.  The patient gave consent for this Medical Advice Message and is aware that it may result in a bill to their insurance company as well as the possibility that this may result in a co-payment or deductible. They are an established patient, but are not seeking medical advice exclusively about a problem treated during an in person or video visit in the last 7 days. I did not recommend an in person or video visit within 7 days of my reply.  I spent a total of 15 minutes cumulative time within 7 days through MyChart messaging Jessica Copland, MD  

## 2022-10-08 NOTE — Telephone Encounter (Signed)
Patient started to have frequent watery stools, for which we tested for cdifficile. Testing returned as positive.  Spoke with Zaakirah to start on oral vancomycin 125mg  QID x 10 days. Then we will also plan to arrange for her to get VOWST (oral FMT) for better success to minimize risk for recurrent cdifficile  Also spoke with dr copland - to reinforce patient not to take any unnecessary abtx

## 2022-10-08 NOTE — Telephone Encounter (Signed)
Pt's daughter would like to make pcp aware of some things. She states her mom has been self treating for utis and she thinks it is contributing to her reoccurring cdiff. States she has been taking left over bactrim instead of "bothering" the dr. Huel Cote also states the hospital has advised to stop getting prolia and orencia injections because it may be making cdiff come back. The daughter states she has not listened and has already gotten prolia and orcenia again last week. She also has not followed up with Dr.Devshwar since being released from the hospital. Also wanted to mention that her mom has been feeling dizzy with an upset stomach and tested + for cdiff again. She is not sure how to proceed and feels like her mom is taking a a lot of medications right now. She is dropping off a urine specimen later today.

## 2022-10-08 NOTE — Addendum Note (Signed)
Addended by: Abbe Amsterdam C on: 10/08/2022 05:53 AM   Modules accepted: Orders

## 2022-10-09 ENCOUNTER — Other Ambulatory Visit: Payer: Self-pay | Admitting: Pharmacist

## 2022-10-09 LAB — URINE CULTURE
MICRO NUMBER:: 15361901
SPECIMEN QUALITY:: ADEQUATE

## 2022-10-09 NOTE — Progress Notes (Signed)
Orencia infusion orders discontinued. Patient actively being treated for C diff (4th episode).  [name], PharmD, MPH, BCPS, CPP Clinical Pharmacist (Rheumatology and Pulmonology)

## 2022-10-09 NOTE — Telephone Encounter (Signed)
Returned call to patient - she received Orencia infusion and Prolia on 09/29/2022 even though she has had four bouts of C diff. Her last Orencia infusion before this was 06/11/2022. She is currently being treated again with p.o. vancomycin 10-day course.  She has been advised to hold Orencia with ongoing infection symptoms and ongoing treatment. Unfortunately she will have to hold this due to significant immunosuppression. Recommended she call our clinic with any flares so that we may manage acute flares appropriately. Reviewed that [name] will certainly place her at risk for infection and antibiotic treatment failure if she does not hold infusions.  IN regards to Prolia, I discussed risk for atypical fractures with treatment interruption or delay more than 2-3 months. Advised that Prolia is less culpable for immunosuppression compared to Orencia but certainly increased infection risk when used concomitantly with immunosuppressive like Orencia. Can consider spacing Orencia infusions to every 6-8 weeks once C.diff infection and symptoms are fully resolved.  Other tx options: - Previously failed  MTX (elevated LFTs) and leflunomide (hair loss) - Plaquenil (had macular changes) - CKD-  not a candidate for MTX, limited data for SSZ  Chesley Mires, PharmD, MPH, BCPS, CPP Clinical Pharmacist (Rheumatology and Pulmonology)

## 2022-10-10 ENCOUNTER — Telehealth: Payer: Self-pay | Admitting: Pharmacist

## 2022-10-10 LAB — C. DIFFICILE GDH AND TOXIN A/B
GDH ANTIGEN: DETECTED
MICRO NUMBER:: 15351279
SPECIMEN QUALITY:: ADEQUATE
TOXIN A AND B: DETECTED

## 2022-10-10 NOTE — Telephone Encounter (Signed)
Patient called today stating she is experiencing nausea with vancomycin doses despite eating food before and after her first 3 doses of the day. States she has a hard time taking the fourth dose before bed since her bed time changes everyday. She asked if she could take it only three times daily, and I reviewed we must continue four times daily dosing; she verbalized understanding.  Will trial taking Zofran 4 mg ~1 hour before each vancomycin dose to prevent nausea. Asked her to call us on Monday if this has not relieved her nausea over the weekend. Reviewed we can always increase the dose as needed and that we have limited treatment options for her recurrent CDI.   Margarite Gouge, PharmD, CPP, BCIDP, AAHIVP Clinical Pharmacist Practitioner Infectious Diseases Clinical Pharmacist [facility]

## 2022-10-13 ENCOUNTER — Telehealth: Payer: Self-pay | Admitting: Medical Oncology

## 2022-10-13 ENCOUNTER — Other Ambulatory Visit (HOSPITAL_COMMUNITY): Payer: Self-pay

## 2022-10-13 NOTE — Telephone Encounter (Signed)
C diff -"4th time". She will be finished with Vancomycin next Sunday. Labs and CT R/S Schedule message sent to R/S f/u.

## 2022-10-14 ENCOUNTER — Telehealth: Payer: Self-pay

## 2022-10-14 ENCOUNTER — Encounter: Payer: Self-pay | Admitting: Pharmacist

## 2022-10-14 ENCOUNTER — Other Ambulatory Visit (HOSPITAL_COMMUNITY): Payer: Self-pay

## 2022-10-14 ENCOUNTER — Encounter: Payer: Self-pay | Admitting: Family Medicine

## 2022-10-14 ENCOUNTER — Telehealth: Payer: Self-pay | Admitting: Pharmacist

## 2022-10-14 DIAGNOSIS — Z8619 Personal history of other infectious and parasitic diseases: Secondary | ICD-10-CM

## 2022-10-14 MED ORDER — VOWST PO CAPS
4.0000 | ORAL_CAPSULE | Freq: Every day | ORAL | 0 refills | Status: AC
Start: 2022-10-14 — End: 2022-10-17

## 2022-10-14 NOTE — Telephone Encounter (Signed)
[facility] Patient Advocate Encounter  Prior Authorization for Vowst has been approved.    PA# [id number] Effective dates: 02/17/22 through 02/17/23  Patients co-pay is $100.00.   Prescription will need to be sent to [facility] or [facility] will continue to follow.  Clearance Coots, CPhT Specialty Pharmacy Patient [name] for Infectious Disease Phone: [phone] Fax:  [phone]

## 2022-10-14 NOTE — Telephone Encounter (Signed)
Reached out to [place] to counsel her on how to take Vowst. She is going to be finishing her course of vancomycin on Sunday, 9/1. Advised her that she needs to start the process for taking Vowst 2-4 days after finishing her antibiotics. Talked her through the process of taking a laxative the night before her first dose. Advised her that she must take Vowst first thing in the morning on an empty stomach. She does take Pepcid, so I advised her to wait a couple of hours after taking Vowst before taking Pepcid. I provided her with detailed instructions via a MyChart message. Answered all questions. Script sent to specialty pharmacy and will hopefully be delivered before the holiday weekend.   [name], PharmD PGY1 Pharmacy Resident 10/14/2022 2:53 PM

## 2022-10-16 ENCOUNTER — Telehealth: Payer: Self-pay | Admitting: Pharmacist

## 2022-10-16 NOTE — Telephone Encounter (Signed)
Received a call from [name] this morning regarding more questions about Vowst. She requested a call back when her daughter, [name], was with her. I called her and her daughter back and we had a good conversation about starting Vowst. I informed them that she does not need to eat anything special leading up to the day when she would take the magnesium citrate but encouraged her to eat what she was the most comfortable with.   We discussed at length the timing of taking her evening medications on the day that she is supposed to take the magnesium citrate. She and her daughter questioned if she needed to take the full dose of magnesium citrate, and I informed them that she would to ensure maximum efficacy of Vowst. I informed her that she should not eat or drink anything after taking the magnesium citrate, including medications. Advised her to skip both her evening gabapentin and Synthroid dose the night she takes magnesium citrate.  [name] informed me she has not yet heard from the pharmacy to set up delivery of her medication. I provided her with the phone number and told her to reach out if delivery would not be scheduled in time for her to start treatment next week. All other questions answered. Encouraged her to reach out with any other questions or concerns.  [name], PharmD PGY1 Pharmacy Resident 10/16/2022 10:22 AM

## 2022-10-17 ENCOUNTER — Telehealth: Payer: Self-pay | Admitting: Internal Medicine

## 2022-10-17 NOTE — Telephone Encounter (Signed)
Called patient regarding upcoming September appointment, patient is notified.  

## 2022-10-22 ENCOUNTER — Ambulatory Visit (HOSPITAL_COMMUNITY): Payer: Medicare PPO

## 2022-10-22 ENCOUNTER — Other Ambulatory Visit: Payer: Medicare PPO

## 2022-10-28 ENCOUNTER — Ambulatory Visit (HOSPITAL_COMMUNITY): Payer: Medicare PPO

## 2022-10-28 ENCOUNTER — Encounter: Payer: Self-pay | Admitting: Internal Medicine

## 2022-10-28 ENCOUNTER — Inpatient Hospital Stay: Payer: Medicare PPO | Attending: Nurse Practitioner

## 2022-10-28 ENCOUNTER — Telehealth: Payer: Medicare PPO | Admitting: Internal Medicine

## 2022-10-28 ENCOUNTER — Ambulatory Visit: Payer: Medicare PPO | Admitting: Internal Medicine

## 2022-10-28 ENCOUNTER — Other Ambulatory Visit: Payer: Self-pay

## 2022-10-28 ENCOUNTER — Encounter (HOSPITAL_COMMUNITY): Payer: Self-pay

## 2022-10-28 DIAGNOSIS — Z87440 Personal history of urinary (tract) infections: Secondary | ICD-10-CM

## 2022-10-28 DIAGNOSIS — Z8619 Personal history of other infectious and parasitic diseases: Secondary | ICD-10-CM

## 2022-10-28 DIAGNOSIS — N390 Urinary tract infection, site not specified: Secondary | ICD-10-CM

## 2022-10-28 NOTE — Progress Notes (Signed)
   Virtual Visit via Telephone Note  I connected with [name] on 11/09/22 at  2:00 PM EDT by telephone and verified that I am speaking with the correct person using two identifiers.  Location: Patient: at home with daughter Provider: in clinic   I discussed the limitations, risks, security and privacy concerns of performing an evaluation and management service by telephone and the availability of in person appointments. I also discussed with the patient that there may be a patient responsible charge related to this service. The patient expressed understanding and agreed to proceed.   History of Present Illness:   Finished vowst 4 days ago. Feeling fine. No diarrhea Observations/Objective: Fluid speech  Assessment and Plan:   avoid miralax unless constipated - try to not have any antibiotics for 2 months - recurrent uti- will discuss with dr copland if patient can get hiprex 500mg  po bid to help prevent need for abtx - lung ca screening can do ct in 2 wk   Follow Up Instructions: Will see back in 4 wk video wk   I discussed the assessment and treatment plan with the patient. The patient was provided an opportunity to ask questions and all were answered. The patient agreed with the plan and demonstrated an understanding of the instructions.   The patient was advised to call back or seek an in-person evaluation if the symptoms worsen or if the condition fails to improve as anticipated.  I have personally spent 15 minutes involved in face-to-face and non-face-to-face activities for this patient on the day of the visit. Professional time spent includes the following activities: Preparing to see the patient (review of tests), Obtaining and/or reviewing separately obtained history (admission/discharge record), Performing a medically appropriate examination and/or evaluation , Ordering medications/tests/procedures, referring and communicating with other health care professionals,  Documenting clinical information in the EMR, Independently interpreting results (not separately reported), Communicating results to the patient/family/caregiver, Counseling and educating the patient/family/caregiver and Care coordination (not separately reported).     Judyann Munson, MD

## 2022-10-28 NOTE — Progress Notes (Unsigned)
Virtual Visit via Video Note  I connected with [name] on 11/11/22 at  2:50 PM EDT by a video enabled telemedicine application and verified that I am speaking with the correct person using two identifiers.  Location: Patient: Home  Provider: Clinic    I discussed the limitations of evaluation and management by telemedicine and the availability of in person appointments. The patient expressed understanding and agreed to proceed.  CC: Discuss resuming Orencia  History of Present Illness: Patient is a 89 year old female with a past medical history of rheumatoid arthritis and osteoporosis.  Patient was previously doing well on IV Orencia infusions once monthly but has had several recurrent infections requiring a gap in therapy.  She has had 4 bouts of C. difficile as well as recurrent UTIs.  She has been under the care of Dr. Drue Second (ID) on a monthly basis.  Patient's last dose of IV Orencia was administered on 09/29/2022.  Her last dose of Prolia was also administered on 09/29/2022.  She has completed a course of vancomycin.  She has noticed increased constipation but denies any abdominal pain or fevers.  She is currently taking Bactrim for a UTI.  She has not yet been cleared to resume IV Orencia.  Patient states that she has noticed some increased pain and swelling in both feet.  She is also had a more difficult time making complete fist due to the gap in therapy.  She is also noticed some issues with fine motor skills.  She has been taking extra strength Tylenol and tramadol for pain relief.  She also uses a heating pad on her lower back throughout the day which she finds to be helpful.  Patient states she has noticed some issue with balance.  She feels unstable on her feet but denies any dizziness.  She uses either a walker or cane to assist with ambulation.  She has tried both physical therapy and home therapy in the past.  Patient reports morning stiffness for 1 hour Patient reports  nocturnal pain.  Difficulty dressing/grooming: Denies Difficulty climbing stairs: Reports Difficulty getting out of chair: Reports Difficulty using hands for taps, buttons, cutlery, and/or writing: Reports   Review of Systems  Constitutional:  Positive for malaise/fatigue. Negative for fever.  HENT:         +Dry eyes +Dry mouth  Eyes:  Negative for photophobia, pain, discharge and redness.  Respiratory:  Negative for cough, shortness of breath and wheezing.   Cardiovascular:  Negative for chest pain and palpitations.  Gastrointestinal:  Positive for constipation. Negative for blood in stool and diarrhea.  Genitourinary:  Positive for dysuria.       Recurrent UTIs  Musculoskeletal:  Positive for back pain and joint pain. Negative for myalgias and neck pain.       +Joint stiffness +Instability/fall risk  Skin:  Negative for rash.  Neurological:  Negative for dizziness and headaches.  Psychiatric/Behavioral:  Negative for depression. The patient is not nervous/anxious and does not have insomnia.       Observations/Objective: Physical Exam HENT:     Head: Normocephalic and atraumatic.  Eyes:     Conjunctiva/sclera: Conjunctivae normal.  Pulmonary:     Effort: Pulmonary effort is normal.  Neurological:     Mental Status: She is alert and oriented to person, place, and time.  Psychiatric:        Mood and Affect: Mood and affect normal.        Cognition and Memory: Memory  normal.        Judgment: Judgment normal.      Assessment and Plan: Visit Diagnoses: Rheumatoid arthritis of multiple sites with negative rheumatoid factor (HCC) - RF negative, anti-CCP negative: Patient's last dose of IV Orencia was administered on 09/29/2022.  [name] is currently on hold due to recurrent infections.  She has had 4 bouts of C. difficile as well as recurrent UTIs.  She is currently being treated with Bactrim for a UTI.  She has been having monthly office visits with Dr. Kris Hartmann but has not  yet received clearance to reinitiate Orencia.  Discussed that she will likely benefit from spacing Orencia infusions to every 6 weeks in the future for less immunosuppression given history of recurrent infections.  She was in agreement.  Patient plans on further discussing with Dr. Drue Second.  If she is not a good candidate to reinitiate Orencia in the future she may require long-term prednisone use.  Overall her symptoms have been manageable with the use of extra strength Tylenol and tramadol for pain relief.  She has noticed some increased inflammation in her feet but overall her symptoms have been tolerable. She will notify us once she receives clearance to reinitiate Orencia.  She will continue to require close lab monitoring.  She will follow up in the office in 2-3 months or sooner if needed.    High risk medication use -Currently on hold-- Orencia 500 mg IV infusions every 28 days due to recurrent infections.  Patient will require clearance by ID prior to reinitiating Orencia.  Discussed that she would likely benefit from spacing Orencia injections to every 6 weeks for less immunosuppression--she was in agreement.  Patient plans on further discussing with Dr. Drue Second during her upcoming office visit on 12/02/2022.   Previous therapy: Methotrexate-elevated LFTs, Arava-hair loss, Plaquenil discontinued due to macular changes. If she is unable to reinitiate Orencia as she may require long-term prednisone to manage her rheumatoid arthritis.   CBC and CMP were updated on 09/10/2022.  She will continue to require close lab monitoring. TB Gold negative on 10/01/2022.   Sicca syndrome (HCC): Chronic, unchanged.    Raynaud's disease without gangrene: She experiences intermittent symptoms of Raynaud's phenomenon.  No digital ulcerations noted over the video chat today.  Discussed the use of heated gloves as well as keeping her core body temperature warm.    Status post total replacement of right hip - 01/2018  revision by Dr. Magnus Ivan. X-rays of the right hip were updated on 09/12/2021 by Dr. Magnus Ivan.  Completed physical therapy in the past.  She continues to have some instability in her legs.  She is using a cane or walker to assist with ambulation.   Other idiopathic scoliosis, thoracolumbar region - Thoracic kyphosis and thoracolumbar scoliosis.  Patient uses a heating pad throughout the day to alleviate the discomfort in her lower back.  She is using either a cane or walker to assist with ambulation.   Age-related osteoporosis without current pathological fracture - DEXA 01/28/2019: The BMD measured at Femur Neck is 0.636 g/cm2 with a T-score of -2.9. Patient wants to hold off for DEXA scan at this time. She is been experiencing issues with balance and instability in her legs.  She uses either a walker or cane to assist with ambulation.  Offered to place a referral for home therapy but she has declined at this time.  She has tried outpatient physical therapy and home therapy in the past. Her most recent Prolia injection  was administered on 09/29/2022.   Height loss - Thoracic kyphosis noted.   History of recurrent UTIs. Currently being treated with a course of Bactrim.     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  She is following up closely with Dr. Arbutus Ped. Chest CT scheduled 11/20/22.    Elevated cholesterol   Anemia of chronic disease   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HCC)   Essential hypertension - She is followed by cardiologist.  Follow Up Instructions: She will follow up in 3 months or sooner if needed.    I discussed the assessment and treatment plan with the patient. The patient was provided an opportunity to ask questions and all were answered. The patient agreed with the plan and demonstrated an understanding of the instructions.   The patient was advised to call back or seek an in-person evaluation if the symptoms worsen or if the condition fails to  improve as anticipated.  I provided 30 minutes of non-face-to-face time during this encounter.   Gearldine Bienenstock, PA-C

## 2022-10-29 ENCOUNTER — Encounter (HOSPITAL_COMMUNITY): Payer: Medicare PPO

## 2022-11-03 ENCOUNTER — Other Ambulatory Visit: Payer: Self-pay | Admitting: Family Medicine

## 2022-11-03 DIAGNOSIS — N390 Urinary tract infection, site not specified: Secondary | ICD-10-CM

## 2022-11-04 ENCOUNTER — Ambulatory Visit: Payer: Medicare PPO | Admitting: Internal Medicine

## 2022-11-06 ENCOUNTER — Ambulatory Visit: Payer: Medicare PPO | Admitting: Physician Assistant

## 2022-11-06 ENCOUNTER — Other Ambulatory Visit: Payer: Medicare PPO

## 2022-11-06 DIAGNOSIS — N390 Urinary tract infection, site not specified: Secondary | ICD-10-CM | POA: Diagnosis not present

## 2022-11-09 LAB — URINE CULTURE
MICRO NUMBER:: 15489272
SPECIMEN QUALITY:: ADEQUATE

## 2022-11-10 ENCOUNTER — Encounter: Payer: Self-pay | Admitting: Family Medicine

## 2022-11-10 MED ORDER — SULFAMETHOXAZOLE-TRIMETHOPRIM 800-160 MG PO TABS: 1.0000 | ORAL_TABLET | Freq: Two times a day (BID) | ORAL | 0 refills | Status: DC

## 2022-11-11 ENCOUNTER — Encounter: Payer: Self-pay | Admitting: Physician Assistant

## 2022-11-11 ENCOUNTER — Ambulatory Visit: Payer: Medicare PPO | Attending: Rheumatology | Admitting: Physician Assistant

## 2022-11-11 DIAGNOSIS — C3411 Malignant neoplasm of upper lobe, right bronchus or lung: Secondary | ICD-10-CM

## 2022-11-11 DIAGNOSIS — M4125 Other idiopathic scoliosis, thoracolumbar region: Secondary | ICD-10-CM | POA: Diagnosis not present

## 2022-11-11 DIAGNOSIS — E270 Other adrenocortical overactivity: Secondary | ICD-10-CM

## 2022-11-11 DIAGNOSIS — D638 Anemia in other chronic diseases classified elsewhere: Secondary | ICD-10-CM

## 2022-11-11 DIAGNOSIS — Z96641 Presence of right artificial hip joint: Secondary | ICD-10-CM

## 2022-11-11 DIAGNOSIS — Z79899 Other long term (current) drug therapy: Secondary | ICD-10-CM

## 2022-11-11 DIAGNOSIS — M0609 Rheumatoid arthritis without rheumatoid factor, multiple sites: Secondary | ICD-10-CM

## 2022-11-11 DIAGNOSIS — R350 Frequency of micturition: Secondary | ICD-10-CM | POA: Diagnosis not present

## 2022-11-11 DIAGNOSIS — E7800 Pure hypercholesterolemia, unspecified: Secondary | ICD-10-CM

## 2022-11-11 DIAGNOSIS — I7300 Raynaud's syndrome without gangrene: Secondary | ICD-10-CM

## 2022-11-11 DIAGNOSIS — R300 Dysuria: Secondary | ICD-10-CM

## 2022-11-11 DIAGNOSIS — M3500 Sicca syndrome, unspecified: Secondary | ICD-10-CM | POA: Diagnosis not present

## 2022-11-11 DIAGNOSIS — E559 Vitamin D deficiency, unspecified: Secondary | ICD-10-CM

## 2022-11-11 DIAGNOSIS — M810 Age-related osteoporosis without current pathological fracture: Secondary | ICD-10-CM

## 2022-11-11 DIAGNOSIS — R29890 Loss of height: Secondary | ICD-10-CM

## 2022-11-11 DIAGNOSIS — I10 Essential (primary) hypertension: Secondary | ICD-10-CM

## 2022-11-19 ENCOUNTER — Telehealth: Payer: Self-pay | Admitting: Physician Assistant

## 2022-11-20 ENCOUNTER — Ambulatory Visit (HOSPITAL_COMMUNITY)
Admission: RE | Admit: 2022-11-20 | Discharge: 2022-11-20 | Disposition: A | Discharge status: Home / Self Care | Payer: Medicare PPO | Source: Ambulatory Visit | Attending: Internal Medicine | Admitting: Internal Medicine

## 2022-11-20 ENCOUNTER — Encounter (INDEPENDENT_AMBULATORY_CARE_PROVIDER_SITE_OTHER): Payer: Medicare PPO | Admitting: Family Medicine

## 2022-11-20 ENCOUNTER — Inpatient Hospital Stay: Payer: Medicare PPO | Attending: Nurse Practitioner

## 2022-11-20 DIAGNOSIS — Z08 Encounter for follow-up examination after completed treatment for malignant neoplasm: Secondary | ICD-10-CM | POA: Insufficient documentation

## 2022-11-20 DIAGNOSIS — C3490 Malignant neoplasm of unspecified part of unspecified bronchus or lung: Secondary | ICD-10-CM | POA: Insufficient documentation

## 2022-11-20 DIAGNOSIS — Z85118 Personal history of other malignant neoplasm of bronchus and lung: Secondary | ICD-10-CM | POA: Insufficient documentation

## 2022-11-20 DIAGNOSIS — R300 Dysuria: Secondary | ICD-10-CM

## 2022-11-20 DIAGNOSIS — J90 Pleural effusion, not elsewhere classified: Secondary | ICD-10-CM | POA: Diagnosis not present

## 2022-11-20 DIAGNOSIS — I700 Atherosclerosis of aorta: Secondary | ICD-10-CM | POA: Diagnosis not present

## 2022-11-20 DIAGNOSIS — K449 Diaphragmatic hernia without obstruction or gangrene: Secondary | ICD-10-CM | POA: Diagnosis not present

## 2022-11-20 LAB — CMP (CANCER CENTER ONLY)
ALT: 13 U/L (ref 0–44)
AST: 23 U/L (ref 15–41)
Albumin: 4.2 g/dL (ref 3.5–5.0)
Alkaline Phosphatase: 39 U/L (ref 38–126)
Anion gap: 4 — ABNORMAL LOW (ref 5–15)
BUN: 34 mg/dL — ABNORMAL HIGH (ref 8–23)
CO2: 29 mmol/L (ref 22–32)
Calcium: 10 mg/dL (ref 8.9–10.3)
Chloride: 101 mmol/L (ref 98–111)
Creatinine: 1.1 mg/dL — ABNORMAL HIGH (ref 0.44–1.00)
GFR, Estimated: 48 mL/min — ABNORMAL LOW (ref >60)
Glucose, Bld: 99 mg/dL (ref 70–99)
Potassium: 5.1 mmol/L (ref 3.5–5.1)
Sodium: 134 mmol/L — ABNORMAL LOW (ref 135–145)
Total Bilirubin: 0.3 mg/dL (ref 0.3–1.2)
Total Protein: 7 g/dL (ref 6.5–8.1)

## 2022-11-20 LAB — CBC WITH DIFFERENTIAL (CANCER CENTER ONLY)
Abs Immature Granulocytes: 0.01 10*3/uL (ref 0.00–0.07)
Basophils Absolute: 0.1 10*3/uL (ref 0.0–0.1)
Basophils Relative: 1 %
Eosinophils Absolute: 0.1 10*3/uL (ref 0.0–0.5)
Eosinophils Relative: 2 %
HCT: 34.3 % — ABNORMAL LOW (ref 36.0–46.0)
Hemoglobin: 10.8 g/dL — ABNORMAL LOW (ref 12.0–15.0)
Immature Granulocytes: 0 %
Lymphocytes Relative: 31 %
Lymphs Abs: 1.4 10*3/uL (ref 0.7–4.0)
MCH: 29 pg (ref 26.0–34.0)
MCHC: 31.5 g/dL (ref 30.0–36.0)
MCV: 92.2 fL (ref 80.0–100.0)
Monocytes Absolute: 0.4 10*3/uL (ref 0.1–1.0)
Monocytes Relative: 9 %
Neutro Abs: 2.7 10*3/uL (ref 1.7–7.7)
Neutrophils Relative %: 57 %
Platelet Count: 198 10*3/uL (ref 150–400)
RBC: 3.72 MIL/uL — ABNORMAL LOW (ref 3.87–5.11)
RDW: 15.2 % (ref 11.5–15.5)
WBC Count: 4.7 10*3/uL (ref 4.0–10.5)
nRBC: 0 % (ref 0.0–0.2)

## 2022-11-20 MED ORDER — SODIUM CHLORIDE (PF) 0.9 % IJ SOLN
INTRAMUSCULAR | Status: AC
  Filled 2022-11-20: qty 50

## 2022-11-20 MED ORDER — HYDROCODONE-ACETAMINOPHEN 5-325 MG PO TABS: 1.0000 | ORAL_TABLET | Freq: Two times a day (BID) | ORAL | 0 refills | Status: DC | PRN

## 2022-11-20 MED ORDER — IOHEXOL 300 MG/ML  SOLN
60.0000 mL | Freq: Once | INTRAMUSCULAR | Status: AC | PRN
  Administered 2022-11-20: 60 mL via INTRAVENOUS

## 2022-11-24 NOTE — Progress Notes (Unsigned)
[facility] OFFICE PROGRESS NOTE  Ellis, Raven Found, MD [address] [place] [address]  DIAGNOSIS: Multifocal non-small cell lung cancer, adenocarcinoma initially diagnosed as stage IIA (T2b, N0, M0) non-small cell lung cancer, adenocarcinoma with positive EGFR mutation in exon 21 ([id number]) presented with right upper lobe lung mass diagnosed in March of 2015.   PRIOR THERAPY: 1) Status post right upper lobectomy with wedge resection of the right middle lobe under the care of Dr. Tyrone Ellis on 06/07/2013 2) Tagrisso 80 mg p.o. daily started November 19, 2021.  Status post 4 weeks of treatment.  This was discontinued secondary to intolerance and questionable drug-induced pneumonitis.  CURRENT THERAPY: Observation   INTERVAL HISTORY: [name] 89 y.o. female and I connected via telephone encounter today. She was last seen in the clinic by Dr. Arbutus Ellis on 07/24/22. The patient struggles with chronic UTIs and C. Diff. She follows with infectious disease. Regarding her history of lung cancer, she is feeling *** today. She denies fevers, chills, night sweats, or unexplained weight loss. Her shortness of breath is  ***. She denies cough, hemoptysis, or chest pain. She denies nausea, vomiting. Diarrhea or constipation***. She denies headaches or vision changes. She recently had a restaging CT scan performed. She is here for evaluation and to review her scan results.   DOE nd right lower extremity swelling and chronic urinary treact infection. Dr. Drue Ellis from ID. CT scan.   MEDICAL HISTORY: Past Medical History:  Diagnosis Date   Arthralgia of multiple joints    followed by dr [name]   Arthritis    Cardiomyopathy (HCC)    Chronic constipation    Chronic inflammatory arthritis    rhemotolgist-  dr a. [name] ([facility])   Dry eyes    eye drops used    GERD (gastroesophageal reflux disease)    H/O discoid lupus erythematosus    Hiatal hernia     History of colon polyps    Hypothyroidism    Iron deficiency anemia    LBBB (left bundle branch block) 2010   [name]'s disease (erythromelalgia) [facility])    neurologist-  dr [name]   Nocturia    Non-small cell cancer of right lung [name]) surgeon-- dr [name]/  oncologist-  dr [name]--- per lov notes no recurrence/   11-18-2017 per pt denies any symptoms   dx 2015--  Stage IIA (T2b,N0,M0) , +EGFR  mutation in exon 21, non-small cell adenocarcinoma right upper lobe---  s/p  Right upper lobectomy , right middley wedge resection and node dissection---  no chemo or radiation therapy   OA (osteoarthritis)    hands   Osteoporosis    PONV (postoperative nausea and vomiting)    likes phenergan   Raynaud's phenomenon 1965   Renal insufficiency    Rheumatoid arthritis (HCC)    Sciatica    Scoliosis    Sjogren's syndrome (HCC)     ALLERGIES:  is allergic to amlodipine, prochlorperazine edisylate, aspirin, cymbalta [duloxetine hcl], and pamelor [nortriptyline hcl].  MEDICATIONS:  Current Outpatient Medications  Medication Sig Dispense Refill   acetaminophen (TYLENOL) 500 MG tablet Take 1 tablet (500 mg total) by mouth every 6 (six) hours as needed (pain). 30 tablet 0   Biotin 1000 MCG tablet Take 1,000 mcg by mouth 2 (two) times daily.     carvedilol (COREG) 3.125 MG tablet Take 1 tablet (3.125 mg total) by mouth 2 (two) times daily with a meal. 180 tablet 3  estradiol (ESTRACE VAGINAL) 0.1 MG/GM vaginal cream Place one gram vaginally up to three times a week as needed to maintain comfort 42.5 g 12   famotidine (PEPCID) 20 MG tablet Take 1 tablet (20 mg total) by mouth daily. 90 tablet 3   gabapentin (NEURONTIN) 300 MG capsule TAKE 1 CAPSULE BY MOUTH IN THE MORNING, 1 IN THE AFTERNOON, AND 2 AT BEDTIME (Patient taking differently: Take 300 mg by mouth 3 (three) times daily.) 360 capsule 1   HYDROcodone-acetaminophen (NORCO/VICODIN) 5-325 MG tablet Take 1 tablet by mouth 2 (two) times daily as  needed for moderate pain. 30 tablet 0   levothyroxine (SYNTHROID) 75 MCG tablet TAKE 1 TABLET BY MOUTH EVERY DAY BEFORE BREAKFAST 90 tablet 1   Multiple Vitamin (MULTIVITAMIN) tablet Take 1 tablet by mouth daily.     ondansetron (ZOFRAN) 4 MG tablet Take 1 tablet (4 mg total) by mouth every 8 (eight) hours as needed for nausea or vomiting. 30 tablet 1   Probiotic Product (PROBIOTIC PO) Take 1 capsule by mouth daily.      sulfamethoxazole-trimethoprim (BACTRIM DS) 800-160 MG tablet Take 1 tablet by mouth 2 (two) times daily. Take for a total of 5 days 10 tablet 0   traMADol (ULTRAM) 50 MG tablet TAKE 1/2-1 TABLET BY MOUTH EVERY 8 HOURS AS NEEDED. DO NOT COMBINE WITH OTHER PAIN MEDICATION 60 tablet 2   vancomycin (VANCOCIN) 125 MG capsule Take 1 capsule (125 mg total) by mouth 4 (four) times daily. (Patient not taking: Reported on 10/28/2022) 40 capsule 1   No current facility-administered medications for this visit.    SURGICAL HISTORY:  Past Surgical History:  Procedure Laterality Date   ANTERIOR HIP REVISION Right 11/27/2017   Procedure: RIGHT HIP ACETABULAR REVISION;  Surgeon: Kathryne Hitch, MD;  Location: [facility];  Service: Orthopedics;  Laterality: Right;   ANTERIOR HIP REVISION Right 01/24/2018   Procedure: OPEN REDUCTION OF DISLOCATED ANTERIOR HIP WITH REVISION OF LINER AND HIP BALL;  Surgeon: Kathryne Hitch, MD;  Location: [facility];  Service: Orthopedics;  Laterality: Right;   APPENDECTOMY  1950s   BIOPSY  04/14/2018   Procedure: BIOPSY;  Surgeon: Benancio Deeds, MD;  Location[facility];  Service: Gastroenterology;;   BIOPSY  04/16/2018   Procedure: BIOPSY;  Surgeon: Lemar Lofty., MD;  Location: [facility];  Service: Gastroenterology;;   CARDIOVASCULAR STRESS TEST  12/2008    mild fixed basal to mid septal perfusion defect felt likely due to artifact from LBBB, no ischemia, EF 58%   COLONOSCOPY     COLONOSCOPY WITH PROPOFOL N/A 04/16/2018    Procedure: COLONOSCOPY WITH PROPOFOL;  Surgeon: Lemar Lofty., MD;  Location: [facility];  Service: Gastroenterology;  Laterality: N/A;   ESOPHAGOGASTRODUODENOSCOPY (EGD) WITH PROPOFOL N/A 04/14/2018   Procedure: ESOPHAGOGASTRODUODENOSCOPY (EGD) WITH PROPOFOL;  Surgeon: Benancio Deeds, MD;  Location: [facility] ENDOSCOPY;  Service: Gastroenterology;  Laterality: N/A;   FEMORAL-POPLITEAL BYPASS GRAFT Right 04/10/2018   Procedure: REPAIR RIGHT FEMORAL ARTERY PSEUDOANEURYSM, RETROPERITONEAL EXPOSURE OF ILIAC ARTERY, RIGHT POPLITEAL EMBOLECTOMY;  Surgeon: Chuck Hint, MD;  Location: [facility] OR;  Service: Vascular;  Laterality: Right;   FLEXIBLE SIGMOIDOSCOPY N/A 06/17/2019   Procedure: FLEXIBLE SIGMOIDOSCOPY;  Surgeon: Shellia Cleverly, DO;  Location: [facility];  Service: Gastroenterology;  Laterality: N/A;   HEMOSTASIS CLIP PLACEMENT  06/17/2019   Procedure: HEMOSTASIS CLIP PLACEMENT;  Surgeon: Shellia Cleverly, DO;  Location: [facility];  Service: Gastroenterology;;   LYMPH NODE DISSECTION Right 06/07/2013  Procedure: LYMPH NODE DISSECTION;  Surgeon: Delight Ovens, MD;  Location: [facility] OR;  Service: Thoracic;  Laterality: Right;   PATCH ANGIOPLASTY Right 04/10/2018   Procedure: PATCH  ANGIOPLASTY OF RIGHT FEMORAL ARTERY USING BOVINE PATCH, PATCH ANGIOPLASTY OF RIGHT POPLITEAL ARTERY USING BOVINE PATCH;  Surgeon: Chuck Hint, MD;  Location: [facility] OR;  Service: Vascular;  Laterality: Right;   SCHLEROTHERAPY  06/17/2019   Procedure: Theresia Majors OF VARICES;  Surgeon: Shellia Cleverly, DO;  Location: [facility] END[facility];  Service: Gastroenterology;;   THORACIC SYMPATHETECTOMY  1965   "large incision from chest to up to shoulder, the nerves were tied together, for raynaud's   THORACOTOMY  06/07/2013   Procedure: MINI/LIMITED THORACOTOMY; right middle lobe wedge resection;  Surgeon: Delight Ovens, MD;  Location: [facility] OR;  Service: Thoracic;;   TONSILLECTOMY  child   TOTAL  ABDOMINAL HYSTERECTOMY  1980's    W/ BSO   TOTAL HIP ARTHROPLASTY Right 04/28/2014   Procedure: RIGHT TOTAL HIP ARTHROPLASTY ANTERIOR APPROACH;  Surgeon: Kathryne Hitch, MD;  Location: [facility];  Service: Orthopedics;  Laterality: Right;   TRANSTHORACIC ECHOCARDIOGRAM  12/11/2008   ef 45-50%, grade 1 diastolic dysfunction/  mild LAE/  mild AR and MR/  trivial TR   VIDEO ASSISTED THORACOSCOPY (VATS)/WEDGE RESECTION Right 06/07/2013   Procedure: VIDEO ASSISTED THORACOSCOPY (VATS)/right upper lobectomy, On Q;  Surgeon: Delight Ovens, MD;  Location: [facility] OR;  Service: Thoracic;  Laterality: Right;   VIDEO BRONCHOSCOPY N/A 06/07/2013   Procedure: VIDEO BRONCHOSCOPY;  Surgeon: Delight Ovens, MD;  Location: [facility] OR;  Service: Thoracic;  Laterality: N/A;   VIDEO BRONCHOSCOPY WITH ENDOBRONCHIAL NAVIGATION N/A 05/04/2013   Procedure: VIDEO BRONCHOSCOPY WITH ENDOBRONCHIAL NAVIGATION;  Surgeon: Delight Ovens, MD;  Location: [facility] OR;  Service: Thoracic;  Laterality: N/A;    REVIEW OF SYSTEMS:   Review of Systems  Constitutional: Negative for appetite change, chills, fatigue, fever and unexpected weight change.  HENT:   Negative for mouth sores, nosebleeds, sore throat and trouble swallowing.   Eyes: Negative for eye problems and icterus.  Respiratory: Negative for cough, hemoptysis, shortness of breath and wheezing.   Cardiovascular: Negative for chest pain and leg swelling.  Gastrointestinal: Negative for abdominal pain, constipation, diarrhea, nausea and vomiting.  Genitourinary: Negative for bladder incontinence, difficulty urinating, dysuria, frequency and hematuria.   Musculoskeletal: Negative for back pain, gait problem, neck pain and neck stiffness.  Skin: Negative for itching and rash.  Neurological: Negative for dizziness, extremity weakness, gait problem, headaches, light-headedness and seizures.  Hematological: Negative for adenopathy. Does not bruise/bleed easily.   Psychiatric/Behavioral: Negative for confusion, depression and sleep disturbance. The patient is not nervous/anxious.     PHYSICAL EXAMINATION:  There were no vitals taken for this visit.  ECOG PERFORMANCE STATUS: {CHL ONC ECOG [id number]  Physical Exam  Constitutional: Oriented to person, place, and time and well-developed, well-nourished, and in no distress. No distress.  HENT:  Head: Normocephalic and atraumatic.  Mouth/Throat: Oropharynx is clear and moist. No oropharyngeal exudate.  Eyes: Conjunctivae are normal. Right eye exhibits no discharge. Left eye exhibits no discharge. No scleral icterus.  Neck: Normal range of motion. Neck supple.  Cardiovascular: Normal rate, regular rhythm, normal heart sounds and intact distal pulses.   Pulmonary/Chest: Effort normal and breath sounds normal. No respiratory distress. No wheezes. No rales.  Abdominal: Soft. Bowel sounds are normal. Exhibits no distension and no mass. There is no tenderness.  Musculoskeletal: Normal range of motion. Exhibits no edema.  Lymphadenopathy:    No cervical adenopathy.  Neurological: Alert and oriented to person, place, and time. Exhibits normal muscle tone. Gait normal. Coordination normal.  Skin: Skin is warm and dry. No rash noted. Not diaphoretic. No erythema. No pallor.  Psychiatric: Mood, memory and judgment normal.  Vitals reviewed.  LABORATORY DATA: Lab Results  Component Value Date   WBC 4.7 11/20/2022   HGB 10.8 (L) 11/20/2022   HCT 34.3 (L) 11/20/2022   MCV 92.2 11/20/2022   PLT 198 11/20/2022      Chemistry      Component Value Date/Time   NA 134 (L) 11/20/2022 1133   NA 130 (L) 04/26/2021 0000   NA 130 (L) 08/21/2016 1125   K 5.1 11/20/2022 1133   K 4.8 08/21/2016 1125   CL 101 11/20/2022 1133   CO2 29 11/20/2022 1133   CO2 26 08/21/2016 1125   BUN 34 (H) 11/20/2022 1133   BUN 27 04/26/2021 0000   BUN 25.6 08/21/2016 1125   CREATININE 1.10 (H) 11/20/2022 1133   CREATININE  1.13 (H) 08/26/2022 1022   CREATININE 1.2 (H) 08/21/2016 1125      Component Value Date/Time   CALCIUM 10.0 11/20/2022 1133   CALCIUM 10.0 08/21/2016 1125   ALKPHOS 39 11/20/2022 1133   ALKPHOS 54 08/21/2016 1125   AST 23 11/20/2022 1133   AST 27 08/21/2016 1125   ALT 13 11/20/2022 1133   ALT 13 08/21/2016 1125   BILITOT 0.3 11/20/2022 1133   BILITOT 0.31 08/21/2016 1125       RADIOGRAPHIC STUDIES:  No results found.   ASSESSMENT/PLAN:  This is a very pleasant 89 year old Caucasian female with multifocal bilateral adenocarcinoma initially diagnosed as stage IIA non-small cell lung cancer, adenocarcinoma with positive EGFR mutation in exon 21 status post right upper lobectomy as well as wedge resection of the right middle lobe in March 2015 and the patient declined adjuvant systemic chemotherapy. The patient had recent imaging studies that showed increase in the size and number of multifocal disease. She was on Tagrisso 80 mg p.o. daily. She started on 11/19/21. She was hospitalized in November 2023 for suspicious drug induced pneumonitis and possible pulmonary edema. She was treated with high dose steroids.   The patient was tolerating her treatment well except for the hospitalization in early November 2023 with suspicious drug-induced pneumonitis and significant shortness of breath. There was also concern about possibility of pulmonary edema. The patient was treated with high-dose steroids and he felt much better. Dr. Arbutus Ellis offered   resuming treatment with EGFR TKI like Erlotinib at a reduced dose versus continuous observation and monitoring.   She opted for observation.   The patient was seen with Dr. Arbutus Ellis today.  Dr. Arbutus Ellis personally and independently reviewed the scan and discussed results with the patient today.  The scan showed ***.  Dr. Arbutus Ellis recommends ***  F/u?   Scan??  She will continue to follow with infectious disease for her UTIs and c. Diff.   The  patient was advised to call immediately if she has any concerning symptoms in the interval. The patient voices understanding of current disease status and treatment options and is in agreement with the current care plan. All questions were answered. The patient knows to call the clinic with any problems, questions or concerns. We can certainly see the patient much sooner if necessary   No orders of the defined types were placed in this encounter.    I spent {CHL ONC  TIME VISIT - [id number]} counseling the patient face to face. The total time spent in the appointment was {CHL ONC TIME VISIT - BJYNW:[id number]}.  Ayrton Mcvay L Khayden Herzberg, PA-C 11/24/22

## 2022-11-27 ENCOUNTER — Inpatient Hospital Stay: Payer: Medicare PPO | Admitting: Physician Assistant

## 2022-11-27 ENCOUNTER — Other Ambulatory Visit: Payer: Medicare PPO

## 2022-11-27 DIAGNOSIS — C3411 Malignant neoplasm of upper lobe, right bronchus or lung: Secondary | ICD-10-CM | POA: Diagnosis not present

## 2022-11-29 ENCOUNTER — Other Ambulatory Visit: Payer: Self-pay

## 2022-12-02 ENCOUNTER — Other Ambulatory Visit: Payer: Self-pay

## 2022-12-02 ENCOUNTER — Encounter: Payer: Self-pay | Admitting: Internal Medicine

## 2022-12-02 ENCOUNTER — Telehealth (INDEPENDENT_AMBULATORY_CARE_PROVIDER_SITE_OTHER): Payer: Medicare PPO | Admitting: Internal Medicine

## 2022-12-02 DIAGNOSIS — Z8619 Personal history of other infectious and parasitic diseases: Secondary | ICD-10-CM | POA: Diagnosis not present

## 2022-12-02 NOTE — Progress Notes (Signed)
Telephone Visit via Video Note  I connected with [name] on 12/02/22 at  2:45 PM EDT by a video enabled telemedicine application and verified that I am speaking with the correct person using two identifiers.  Location: Patient: at home Provider: in clinic   I discussed the limitations of evaluation and management by telemedicine and the availability of in person appointments. The patient expressed understanding and agreed to proceed.  History of Present Illness:  Hx of recurrent cdifficile. And recurrent uti treatment. 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 The patient was provided an opportunity to ask questions and all were answered. The patient agreed with the plan and demonstrated an understanding of the instructions.   The patient was advised to call back or seek an in-person evaluation if the symptoms worsen or if the condition fails to improve as anticipated.  I provided  10 minutes of face-to-face time during this encounter.   Judyann Munson, MD

## 2022-12-08 ENCOUNTER — Ambulatory Visit: Payer: Medicare PPO | Admitting: Internal Medicine

## 2022-12-09 ENCOUNTER — Other Ambulatory Visit: Payer: Self-pay | Admitting: Family Medicine

## 2022-12-15 ENCOUNTER — Other Ambulatory Visit: Payer: Medicare PPO

## 2022-12-15 DIAGNOSIS — R300 Dysuria: Secondary | ICD-10-CM

## 2022-12-15 MED ORDER — SULFAMETHOXAZOLE-TRIMETHOPRIM 800-160 MG PO TABS
1.0000 | ORAL_TABLET | Freq: Two times a day (BID) | ORAL | 0 refills | Status: DC
Start: 2022-12-15 — End: 2022-12-17

## 2022-12-15 NOTE — Telephone Encounter (Signed)
Please see the MyChart message reply(ies) for my assessment and plan.  The patient gave consent for this Medical Advice Message and is aware that it may result in a bill to their insurance company as well as the possibility that this may result in a co-payment or deductible. They are an established patient, but are not seeking medical advice exclusively about a problem treated during an in person or video visit in the last 7 days. I did not recommend an in person or video visit within 7 days of my reply.  I spent a total of 15 minutes cumulative time within 7 days through MyChart messaging Jessica Copland, MD  

## 2022-12-15 NOTE — Addendum Note (Signed)
Addended by: Abbe Amsterdam C on: 12/15/2022 10:09 AM   Modules accepted: Orders

## 2022-12-15 NOTE — Addendum Note (Signed)
Addended by: Abbe Amsterdam C on: 12/15/2022 12:46 PM   Modules accepted: Orders

## 2022-12-15 NOTE — Progress Notes (Signed)
Specimen drop off

## 2022-12-17 LAB — URINE CULTURE
MICRO NUMBER:: 15651129
SPECIMEN QUALITY:: ADEQUATE

## 2022-12-17 MED ORDER — CIPROFLOXACIN HCL 250 MG PO TABS
250.0000 mg | ORAL_TABLET | Freq: Two times a day (BID) | ORAL | 0 refills | Status: AC
Start: 2022-12-17 — End: 2022-12-20

## 2022-12-17 NOTE — Telephone Encounter (Signed)
Received urine culture-unfortunately resistant to Septra that she is taking Message to patient, we will change her to Cipro    Component 2 d ago  MICRO NUMBER: [id number]  SPECIMEN QUALITY: Adequate  Sample Source NOT GIVEN  STATUS: FINAL  ISOLATE 1: Citrobacter freundii Abnormal   Comment: Greater than 100,000 CFU/mL of Citrobacter freundii  Resulting Agency [facility]     Susceptibility   Citrobacter freundii    URINE CULTURE, REFLEX    AMOX/CLAVULANIC >=32 Resistant    CEFAZOLIN >=64 Resistant 1    CEFEPIME <=1 Sensitive    CEFTAZIDIME <=1 Sensitive    CEFTRIAXONE <=1 Sensitive    CIPROFLOXACIN <=0.25 Sensitive    GENTAMICIN >=16 Resistant    IMIPENEM <=0.25 Sensitive    LEVOFLOXACIN <=0.12 Sensitive    NITROFURANTOIN <=16 Sensitive    PIP/TAZO <=4 Sensitive    TOBRAMYCIN 4 Sensitive    TRIMETH/SULFA >=320 Resistant 2

## 2022-12-17 NOTE — Addendum Note (Signed)
Addended by: Abbe Amsterdam C on: 12/17/2022 07:11 PM   Modules accepted: Orders

## 2022-12-21 ENCOUNTER — Other Ambulatory Visit: Payer: Self-pay | Admitting: Cardiology

## 2022-12-25 NOTE — Progress Notes (Signed)
HPI: FU CHF. Nuclear study 2010 showed ejection fraction 58% with fixed septal defect related to left bundle branch block and no ischemia.  Echocardiogram February 2020 showed normal LV systolic function grade 1 diastolic dysfunction, mild left atrial enlargement, mild aortic insufficiency.  Monitor April 2021 showed occasional PVC and brief episode of SVT.  Patient has been diagnosed with lung cancer.  CT February 2023 showed widespread pulmonary nodules increasing in size and emphysema.  Admitted November 2023 with acute respiratory failure felt secondary to CHF.  She was diuresed with improvement and also had thoracentesis.  Noted to have severe MR previously. There was discussion with Dr. Excell Seltzer concerning potential MitraClip.  There was also note that Tagrisso could be contributing to CHF.  She elected to discontinue Tagrisso to see if her symptoms would improve.  CT March 2024 showed enlargement of numerous scattered solid and subsolid nodules in both lungs suspicious for low-grade adenocarcinoma. Venous Dopplers December 2023 showed no DVT.  Most recent echocardiogram July 2024 showed ejection fraction 40 to 45%, severe left ventricular enlargement, grade 1 diastolic dysfunction, moderate left atrial enlargement, moderate to severe mitral regurgitation, mild aortic insufficiency.  Since last seen, she has dyspnea on exertion unchanged by her report.  No orthopnea, PND, pedal edema, exertional chest pain or syncope.  Current Outpatient Medications  Medication Sig Dispense Refill   acetaminophen (TYLENOL) 500 MG tablet Take 1 tablet (500 mg total) by mouth every 6 (six) hours as needed (pain). 30 tablet 0   Biotin 1000 MCG tablet Take 1,000 mcg by mouth 2 (two) times daily.     carvedilol (COREG) 3.125 MG tablet TAKE 1 TABLET BY MOUTH TWICE A DAY WITH A MEAL 180 tablet 1   estradiol (ESTRACE VAGINAL) 0.1 MG/GM vaginal cream Place one gram vaginally up to three times a week as needed to maintain  comfort 42.5 g 12   famotidine (PEPCID) 20 MG tablet Take 1 tablet (20 mg total) by mouth daily. 90 tablet 3   gabapentin (NEURONTIN) 300 MG capsule Take 1 capsule (300 mg total) by mouth 3 (three) times daily. 360 capsule 1   HYDROcodone-acetaminophen (NORCO/VICODIN) 5-325 MG tablet Take 1 tablet by mouth 2 (two) times daily as needed for moderate pain. 30 tablet 0   levothyroxine (SYNTHROID) 75 MCG tablet TAKE 1 TABLET BY MOUTH EVERY DAY BEFORE BREAKFAST 90 tablet 1   Multiple Vitamin (MULTIVITAMIN) tablet Take 1 tablet by mouth daily.     ondansetron (ZOFRAN) 4 MG tablet Take 1 tablet (4 mg total) by mouth every 8 (eight) hours as needed for nausea or vomiting. 30 tablet 1   Probiotic Product (PROBIOTIC PO) Take 1 capsule by mouth daily.      traMADol (ULTRAM) 50 MG tablet TAKE 1/2-1 TABLET BY MOUTH EVERY 8 HOURS AS NEEDED. DO NOT COMBINE WITH OTHER PAIN MEDICATION 60 tablet 2   No current facility-administered medications for this visit.     Past Medical History:  Diagnosis Date   Arthralgia of multiple joints    followed by dr Sharmon Revere   Arthritis    Cardiomyopathy [facility])    Chronic constipation    Chronic inflammatory arthritis    rhemotolgist-  dr a. Sharmon Revere ([facility])   Dry eyes    eye drops used    GERD (gastroesophageal reflux disease)    H/O discoid lupus erythematosus    Hiatal hernia    History of colon polyps    Hypothyroidism    Iron deficiency  anemia    LBBB (left bundle branch block) 2010   Mitchell's disease (erythromelalgia) [facility])    neurologist-  dr patel   Nocturia    Non-small cell cancer of right lung [facility]) surgeon-- dr gerhardt/  oncologist-  dr Arbutus Ped--- per lov notes no recurrence/   11-18-2017 per pt denies any symptoms   dx 2015--  Stage IIA (T2b,N0,M0) , +EGFR  mutation in exon 21, non-small cell adenocarcinoma right upper lobe---  s/p  Right upper lobectomy , right middley wedge resection and node dissection---  no chemo or radiation  therapy   OA (osteoarthritis)    hands   Osteoporosis    PONV (postoperative nausea and vomiting)    likes phenergan   Raynaud's phenomenon 1965   Renal insufficiency    Rheumatoid arthritis (HCC)    Sciatica    Scoliosis    Sjogren's syndrome [facility])     Past Surgical History:  Procedure Laterality Date   ANTERIOR HIP REVISION Right 11/27/2017   Procedure: RIGHT HIP ACETABULAR REVISION;  Surgeon: Kathryne Hitch, MD;  Location: [facility] ORS;  Service: Orthopedics;  Laterality: Right;   ANTERIOR HIP REVISION Right 01/24/2018   Procedure: OPEN REDUCTION OF DISLOCATED ANTERIOR HIP WITH REVISION OF LINER AND HIP BALL;  Surgeon: Kathryne Hitch, MD;  Location: [facility];  Service: Orthopedics;  Laterality: Right;   APPENDECTOMY  1950s   BIOPSY  04/14/2018   Procedure: BIOPSY;  Surgeon: Benancio Deeds, MD;  Location: [facility] ENDOSCOPY;  Service: Gastroenterology;;   BIOPSY  04/16/2018   Procedure: BIOPSY;  Surgeon: Lemar Lofty., MD;  Location: [facility] ENDOSCOPY;  Service: Gastroenterology;;   CARDIOVASCULAR STRESS TEST  12/2008    mild fixed basal to mid septal perfusion defect felt likely due to artifact from LBBB, no ischemia, EF 58%   COLONOSCOPY     COLONOSCOPY WITH PROPOFOL N/A 04/16/2018   Procedure: COLONOSCOPY WITH PROPOFOL;  Surgeon: Lemar Lofty., MD;  Location: [facility] ENDOSCOPY;  Service: Gastroenterology;  Laterality: N/A;   ESOPHAGOGASTRODUODENOSCOPY (EGD) WITH PROPOFOL N/A 04/14/2018   Procedure: ESOPHAGOGASTRODUODENOSCOPY (EGD) WITH PROPOFOL;  Surgeon: Benancio Deeds, MD;  Location: [facility] ENDOSCOPY;  Service: Gastroenterology;  Laterality: N/A;   FEMORAL-POPLITEAL BYPASS GRAFT Right 04/10/2018   Procedure: REPAIR RIGHT FEMORAL ARTERY PSEUDOANEURYSM, RETROPERITONEAL EXPOSURE OF ILIAC ARTERY, RIGHT POPLITEAL EMBOLECTOMY;  Surgeon: Chuck Hint, MD;  Location: [facility] OR;  Service: Vascular;  Laterality: Right;   FLEXIBLE SIGMOIDOSCOPY N/A 06/17/2019    Procedure: FLEXIBLE SIGMOIDOSCOPY;  Surgeon: Shellia Cleverly, DO;  Location: [facility] ENDOSCOPY;  Service: Gastroenterology;  Laterality: N/A;   HEMOSTASIS CLIP PLACEMENT  06/17/2019   Procedure: HEMOSTASIS CLIP PLACEMENT;  Surgeon: Shellia Cleverly, DO;  Location: [facility] ENDOSCOPY;  Service: Gastroenterology;;   LYMPH NODE DISSECTION Right 06/07/2013   Procedure: LYMPH NODE DISSECTION;  Surgeon: Delight Ovens, MD;  Location: [facility] OR;  Service: Thoracic;  Laterality: Right;   PATCH ANGIOPLASTY Right 04/10/2018   Procedure: PATCH  ANGIOPLASTY OF RIGHT FEMORAL ARTERY USING BOVINE PATCH, PATCH ANGIOPLASTY OF RIGHT POPLITEAL ARTERY USING BOVINE PATCH;  Surgeon: Chuck Hint, MD;  [facility];  Service: Vascular;  Laterality: Right;   SCHLEROTHERAPY  06/17/2019   Procedure: Theresia Majors OF VARICES;  Surgeon: Shellia Cleverly, DO;  Location: [facility] ENDOSCOPY;  Service: Gastroenterology;;   THORACIC SYMPATHETECTOMY  1965   "large incision from chest to up to shoulder, the nerves were tied together, for raynaud's   THORACOTOMY  06/07/2013   Procedure: MINI/LIMITED THORACOTOMY; right middle  lobe wedge resection;  Surgeon: Delight Ovens, MD;  Location: [facility] OR;  Service: Thoracic;;   TONSILLECTOMY  child   TOTAL ABDOMINAL HYSTERECTOMY  1980's    W/ BSO   TOTAL HIP ARTHROPLASTY Right 04/28/2014   Procedure: RIGHT TOTAL HIP ARTHROPLASTY ANTERIOR APPROACH;  Surgeon: Kathryne Hitch, MD;  Location: [facility];  Service: Orthopedics;  Laterality: Right;   TRANSTHORACIC ECHOCARDIOGRAM  12/11/2008   ef 45-50%, grade 1 diastolic dysfunction/  mild LAE/  mild AR and MR/  trivial TR   VIDEO ASSISTED THORACOSCOPY (VATS)/WEDGE RESECTION Right 06/07/2013   Procedure: VIDEO ASSISTED THORACOSCOPY (VATS)/right upper lobectomy, On Q;  Surgeon: Delight Ovens, MD;  Location: [facility] OR;  Service: Thoracic;  Laterality: Right;   VIDEO BRONCHOSCOPY N/A 06/07/2013   Procedure: VIDEO BRONCHOSCOPY;  Surgeon: Delight Ovens, MD;  Location: [facility] OR;  Service: Thoracic;  Laterality: N/A;   VIDEO BRONCHOSCOPY WITH ENDOBRONCHIAL NAVIGATION N/A 05/04/2013   Procedure: VIDEO BRONCHOSCOPY WITH ENDOBRONCHIAL NAVIGATION;  Surgeon: Delight Ovens, MD;  Location: [facility] OR;  Service: Thoracic;  Laterality: N/A;    Social History   Socioeconomic History   Marital status: Widowed    Spouse name: Not on file   Number of children: 2   Years of education: Not on file   Highest education level: Master's degree (e.g., MA, MS, MEng, MEd, MSW, MBA)  Occupational History   Occupation: n/a  Tobacco Use   Smoking status: Never    Passive exposure: Past   Smokeless tobacco: Never  Vaping Use   Vaping status: Never Used  Substance and Sexual Activity   Alcohol use: Not Currently   Drug use: Never   Sexual activity: Not Currently    Birth control/protection: Surgical  Other Topics Concern   Not on file  Social History Narrative   Lives with husband, daughter and grandchild local.   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   Social Determinants of Health   Financial Resource Strain: Low Risk  (08/13/2022)   Overall Financial Resource Strain (CARDIA)    Difficulty of Paying Living Expenses: Not hard at all  Food Insecurity: No Food Insecurity (08/25/2022)   Hunger Vital Sign    Worried About Running Out of Food in the Last Year: Never true    Ran Out of Food in the Last Year: Never true  Transportation Needs: No Transportation Needs (08/25/2022)   PRAPARE - Administrator, Civil Service (Medical): No    Lack of Transportation (Non-Medical): No  Physical Activity: Unknown (08/13/2022)   Exercise Vital Sign    Days of Exercise per Week: Patient declined    Minutes of Exercise per Session: Not on file  Stress: Patient Declined (08/13/2022)   [employer] of Occupational Health - Occupational Stress Questionnaire    Feeling of Stress : Patient declined  Social Connections:  Moderately Integrated (08/13/2022)   Social Connection and Isolation Panel [NHANES]    Frequency of Communication with Friends and Family: More than three times a week    Frequency of Social Gatherings with Friends and Family: More than three times a week    Attends Religious Services: More than 4 times per year    Active Member of [employer] or Organizations: Yes    Attends Banker Meetings: More than 4 times per year    Marital Status: Widowed  Intimate Partner Violence: Not At Risk (08/19/2022)   Humiliation, Afraid, Rape, and Kick questionnaire  Fear of Current or Ex-Partner: No    Emotionally Abused: No    Physically Abused: No    Sexually Abused: No    Family History  Problem Relation Age of Onset   Coronary artery disease Father    Colon cancer Father    Diabetes Father    Cancer Father        colon   Other Mother 71       MVA   Healthy Sister    Healthy Brother    Healthy Daughter    Hypothyroidism Daughter    Other Brother        killed in war   Pneumonia Sister    Healthy Daughter    Esophageal cancer Neg Hx    Kidney disease Neg Hx    Liver disease Neg Hx     ROS: no fevers or chills, productive cough, hemoptysis, dysphasia, odynophagia, melena, hematochezia, dysuria, hematuria, rash, seizure activity, orthopnea, PND, pedal edema, claudication. Remaining systems are negative.  Physical Exam: Well-developed well-nourished in no acute distress.  Skin is warm and dry.  HEENT is normal.  Neck is supple.  Chest is clear to auscultation with normal expansion.  Cardiovascular exam is regular rate and rhythm.  Abdominal exam nontender or distended. No masses palpated. Extremities show no edema. neuro grossly intact  EKG Interpretation Date/Time:  Wednesday January 07 2023 09:19:28 EST Ventricular Rate:  75 PR Interval:  172 QRS Duration:  132 QT Interval:  410 QTC Calculation: 457 R Axis:   -81  Text Interpretation: Normal sinus rhythm with sinus  arrhythmia Left axis deviation Left bundle branch block When compared with ECG of 18-Aug-2022 15:47, PREVIOUS ECG IS PRESENT Confirmed by Olga Millers ([id number]) on 01/07/2023 9:30:47 AM    A/P  1 cardiomyopathy-felt possibly secondary to dyssynchrony.  Given metastatic lung cancer we have elected to treat conservatively.  Will increase carvedilol to 6.25 mg twice daily.  Can consider low-dose ARB in the future if needed.  2 chronic combined systolic/diastolic CHF-patient was considered for MitraClip previously but given patient's age and metastatic lung cancer I do not think this would be appropriate.  Plan conservative measures.  Note she is not on a diuretic and is euvolemic on examination.  Her volume overload previously was likely secondary to Tagrisso.  3 palpitations-continue beta-blocker.  4 moderate to severe mitral regurgitation-plan as outlined under #2  5 left bundle branch block  6 lung cancer-followed by oncology.  Olga Millers, MD

## 2023-01-06 ENCOUNTER — Other Ambulatory Visit: Payer: Self-pay | Admitting: Family Medicine

## 2023-01-06 DIAGNOSIS — M25512 Pain in left shoulder: Secondary | ICD-10-CM

## 2023-01-07 ENCOUNTER — Encounter: Payer: Self-pay | Admitting: Cardiology

## 2023-01-07 ENCOUNTER — Ambulatory Visit: Payer: Medicare PPO | Attending: Cardiology | Admitting: Cardiology

## 2023-01-07 VITALS — BP 157/64 | HR 75 | Ht 60.0 in | Wt 107.4 lb

## 2023-01-07 DIAGNOSIS — R002 Palpitations: Secondary | ICD-10-CM | POA: Diagnosis not present

## 2023-01-07 DIAGNOSIS — I5040 Unspecified combined systolic (congestive) and diastolic (congestive) heart failure: Secondary | ICD-10-CM | POA: Diagnosis not present

## 2023-01-07 DIAGNOSIS — I420 Dilated cardiomyopathy: Secondary | ICD-10-CM

## 2023-01-07 DIAGNOSIS — I340 Nonrheumatic mitral (valve) insufficiency: Secondary | ICD-10-CM | POA: Diagnosis not present

## 2023-01-07 MED ORDER — CARVEDILOL 6.25 MG PO TABS: 6.2500 mg | ORAL_TABLET | Freq: Two times a day (BID) | ORAL | 3 refills | Status: DC

## 2023-01-07 NOTE — Patient Instructions (Signed)
Medication Instructions:   INCREASE CARVEDILOL TO 6.25 MG TWICE DAILY= 2 OF THE 3.125 MG TWICE DAILY  *If you need a refill on your cardiac medications before your next appointment, please call your pharmacy*   Follow-Up: At [facility], you and your health needs are our priority.  As part of our continuing mission to provide you with exceptional heart care, we have created designated Provider Care Teams.  These Care Teams include your primary Cardiologist (physician) and Advanced Practice Providers (APPs -  Physician Assistants and Nurse Practitioners) who all work together to provide you with the care you need, when you need it.  We recommend signing up for the patient portal called "MyChart".  Sign up information is provided on this After Visit Summary.  MyChart is used to connect with patients for Virtual Visits (Telemedicine).  Patients are able to view lab/test results, encounter notes, upcoming appointments, etc.  Non-urgent messages can be sent to your provider as well.   To learn more about what you can do with MyChart, go to [email or web address].    Your next appointment:   6 month(s)  Provider:   Olga Millers, MD

## 2023-01-18 NOTE — Patient Instructions (Incomplete)
It was great to see you again today, happy holidays!    I will get you set up to see orthopedics with [facility] Address: [address], [place], [place] [address] Phone: [phone]  I will increase your dosage of the tramadol- you can take one every 6-8 hours as needed to control your pain  If all is well please see me in about 6 months

## 2023-01-18 NOTE — Progress Notes (Unsigned)
Healthcare at [facility] [address], [address] [place], [place] [address] [phone]  Date:  01/21/2023   Name:  [name]   DOB:  12-18-1930   MRN:  [id number]  PCP:  Pearline Cables, MD    Chief Complaint: No chief complaint on file.   History of Present Illness:  [name] is a 89 y.o. 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   She does struggle with frequent UTI, I prescribed vaginal estrogen last year-make sure she is still taking it  Seen by her cardiologist, Dr. Jens Som about 2 weeks ago: 1 cardiomyopathy-felt possibly secondary to dyssynchrony.  Given metastatic lung cancer we have elected to treat conservatively.  Will increase carvedilol to 6.25 mg twice daily.  Can consider low-dose ARB in the future if needed. 2 chronic combined systolic/diastolic CHF-patient was considered for MitraClip previously but given patient's age and metastatic lung cancer I do not think this would be appropriate.  Plan conservative measures.  Note she is not on a diuretic and is euvolemic on examination.  Her volume overload previously was likely secondary to Tagrisso. 3 palpitations-continue beta-blocker. 4 moderate to severe mitral regurgitation-plan as outlined under #2 5 left bundle branch block  Visit with her oncology team in October-her lung cancer is currently under observation  Shingrix COVID booster Flu vaccine  Lab Results  Component Value Date   TSH 2.53 09/10/2022    Patient Active Problem List   Diagnosis Date Noted   Diarrhea of presumed infectious origin 08/22/2022   Colitis 08/18/2022   Acute  congestive heart failure (HCC) 12/29/2021   Acute respiratory failure (HCC) 12/24/2021   Acute respiratory failure with hypoxia (HCC) 12/23/2021   Acute on chronic combined systolic and diastolic CHF (congestive heart failure) (HCC) 12/23/2021   Rectal pain 10/10/2020   Acute posthemorrhagic anemia    Rectal bleeding 06/17/2019   Grade II internal hemorrhoids    Rectal ulcer    Popliteal artery occlusion, 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/10/2018   HTN (hypertension) 04/10/2018   Femoral artery pseudo-aneurysm, 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/09/2018   Anemia of chronic disease 02/24/2018   Unstable right hip arthroplasty 01/24/2018   History of revision of total replacement of right hip joint 01/24/2018   Hypovolemic shock (HCC)    Hyperkalemia    Hyponatremia    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   Status post revision of total hip 11/27/2017   Elevated cholesterol 10/11/2015   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HCC) 10/04/2014   Bilateral leg edema 08/01/2014   Elevated BP 08/01/2014   Rheumatoid arthritis involving multiple joints (HCC) 05/30/2014   Status post total replacement of right hip 04/28/2014   Long-term use of high-risk medication 11/11/2013   Symptomatic anemia 11/11/2013   Neuropathic pain 07/07/2013   Constipation due to pain medication 07/07/2013   Protein-calorie malnutrition, severe (HCC) 06/08/2013   Lung cancer, Right upper lobe 05/08/2013   Sciatica of right side 08/13/2011   Osteoporosis 03/07/2010   PARESTHESIA 03/07/2010   Low back pain 03/16/2009   CT, CHEST, ABNORMAL 12/18/2008   ABNORMAL ECHOCARDIOGRAM 12/14/2008   SJOGREN'S SYNDROME 11/29/2008   HYPOGLYCEMIA 06/29/2006   RAYNAUD'S DISEASE 06/29/2006    Past Medical History:  Diagnosis  Date   Arthralgia of multiple joints    followed by dr Sharmon Revere   Arthritis    Cardiomyopathy (HCC)    Chronic constipation    Chronic inflammatory arthritis    rhemotolgist-  dr a. Sharmon Revere ([facility])   Dry eyes    eye  drops used    GERD (gastroesophageal reflux disease)    H/O discoid lupus erythematosus    Hiatal hernia    History of colon polyps    Hypothyroidism    Iron deficiency anemia    LBBB (left bundle branch block) 2010   Mitchell's disease (erythromelalgia) [facility])    neurologist-  dr patel   Nocturia    Non-small cell cancer of right lung [facility]) surgeon-- dr gerhardt/  oncologist-  dr Arbutus Ped--- per lov notes no recurrence/   11-18-2017 per pt denies any symptoms   dx 2015--  Stage IIA (T2b,N0,M0) , +EGFR  mutation in exon 21, non-small cell adenocarcinoma right upper lobe---  s/p  Right upper lobectomy , right middley wedge resection and node dissection---  no chemo or radiation therapy   OA (osteoarthritis)    hands   Osteoporosis    PONV (postoperative nausea and vomiting)    likes phenergan   Raynaud's phenomenon 1965   Renal insufficiency    Rheumatoid arthritis (HCC)    Sciatica    Scoliosis    Sjogren's syndrome [facility])     Past Surgical History:  Procedure Laterality Date   ANTERIOR HIP REVISION Right 11/27/2017   Procedure: RIGHT HIP ACETABULAR REVISION;  Surgeon: Kathryne Hitch, MD;  Location: [facility] ORS;  Service: Orthopedics;  Laterality: Right;   ANTERIOR HIP REVISION Right 01/24/2018   Procedure: OPEN REDUCTION OF DISLOCATED ANTERIOR HIP WITH REVISION OF LINER AND HIP BALL;  Surgeon: Kathryne Hitch, MD;  Location: [facility]S;  Service: Orthopedics;  Laterality: Right;   APPENDECTOMY  1950s   BIOPSY  04/14/2018   Procedure: BIOPSY;  Surgeon: Benancio Deeds, MD;  Location: [facility] ENDOSCOPY;  Service: Gastroenterology;;   BIOPSY  04/16/2018   Procedure: BIOPSY;  Surgeon: Lemar Lofty., MD;  Location: [facility] ENDOSCOPY;  Service: Gastroenterology;;   CARDIOVASCULAR STRESS TEST  12/2008    mild fixed basal to mid septal perfusion defect felt likely due to artifact from LBBB, no ischemia, EF 58%   COLONOSCOPY     COLONOSCOPY WITH PROPOFOL N/A 04/16/2018    Procedure: COLONOSCOPY WITH PROPOFOL;  Surgeon: Lemar Lofty., MD;  Location: [facility] ENDOSCOPY;  Service: Gastroenterology;  Laterality: N/A;   ESOPHAGOGASTRODUODENOSCOPY (EGD) WITH PROPOFOL N/A 04/14/2018   Procedure: ESOPHAGOGASTRODUODENOSCOPY (EGD) WITH PROPOFOL;  Surgeon: Benancio Deeds, MD;  Location: [facility] ENDOSCOPY;  Service: Gastroenterology;  Laterality: N/A;   FEMORAL-POPLITEAL BYPASS GRAFT Right 04/10/2018   Procedure: REPAIR RIGHT FEMORAL ARTERY PSEUDOANEURYSM, RETROPERITONEAL EXPOSURE OF ILIAC ARTERY, RIGHT POPLITEAL EMBOLECTOMY;  Surgeon: Chuck Hint, MD;  Location: [facility] OR;  Service: Vascular;  Laterality: Right;   FLEXIBLE SIGMOIDOSCOPY N/A 06/17/2019   Procedure: FLEXIBLE SIGMOIDOSCOPY;  Surgeon: Shellia Cleverly, DO;  Location: [facility] ENDOSCOPY;  Service: Gastroenterology;  Laterality: N/A;   HEMOSTASIS CLIP PLACEMENT  06/17/2019   Procedure: HEMOSTASIS CLIP PLACEMENT;  Surgeon: Shellia Cleverly, DO;  Location: [facility] ENDOSCOPY;  Service: Gastroenterology;;   LYMPH NODE DISSECTION Right 06/07/2013   Procedure: LYMPH NODE DISSECTION;  Surgeon: Delight Ovens, MD;  Location: [facility] OR;  Service: Thoracic;  Laterality: Right;   PATCH ANGIOPLASTY Right 04/10/2018   Procedure: PATCH  ANGIOPLASTY OF RIGHT  FEMORAL ARTERY USING BOVINE PATCH, PATCH ANGIOPLASTY OF RIGHT POPLITEAL ARTERY USING BOVINE PATCH;  Surgeon: Chuck Hint, MD;  Location: [facility] OR;  Service: Vascular;  Laterality: Right;   SCHLEROTHERAPY  06/17/2019   Procedure: Theresia Majors OF VARICES;  Surgeon: Shellia Cleverly, DO;  Location: [facility] ENDOSCOPY;  Service: Gastroenterology;;   THORACIC SYMPATHETECTOMY  1965   "large incision from chest to up to shoulder, the nerves were tied together, for raynaud's   THORACOTOMY  06/07/2013   Procedure: MINI/LIMITED THORACOTOMY; right middle lobe wedge resection;  Surgeon: Delight Ovens, MD;  Location: [facility] OR;  Service: Thoracic;;   TONSILLECTOMY  child   TOTAL  ABDOMINAL HYSTERECTOMY  1980's    W/ BSO   TOTAL HIP ARTHROPLASTY Right 04/28/2014   Procedure: RIGHT TOTAL HIP ARTHROPLASTY ANTERIOR APPROACH;  Surgeon: Kathryne Hitch, MD;  Location: [facility] ORS;  Service: Orthopedics;  Laterality: Right;   TRANSTHORACIC ECHOCARDIOGRAM  12/11/2008   ef 45-50%, grade 1 diastolic dysfunction/  mild LAE/  mild AR and MR/  trivial TR   VIDEO ASSISTED THORACOSCOPY (VATS)/WEDGE RESECTION Right 06/07/2013   Procedure: VIDEO ASSISTED THORACOSCOPY (VATS)/right upper lobectomy, On Q;  Surgeon: Delight Ovens, MD;  Location: [facility] OR;  Service: Thoracic;  Laterality: Right;   VIDEO BRONCHOSCOPY N/A 06/07/2013   Procedure: VIDEO BRONCHOSCOPY;  Surgeon: Delight Ovens, MD;  Location: [facility] OR;  Service: Thoracic;  Laterality: N/A;   VIDEO BRONCHOSCOPY WITH ENDOBRONCHIAL NAVIGATION N/A 05/04/2013   Procedure: VIDEO BRONCHOSCOPY WITH ENDOBRONCHIAL NAVIGATION;  Surgeon: Delight Ovens, MD;  Location: [facility] OR;  Service: Thoracic;  Laterality: N/A;    Social History   Tobacco Use   Smoking status: Never    Passive exposure: Past   Smokeless tobacco: Never  Vaping Use   Vaping status: Never Used  Substance Use Topics   Alcohol use: Not Currently   Drug use: Never    Family History  Problem Relation Age of Onset   Coronary artery disease Father    Colon cancer Father    Diabetes Father    Cancer Father        colon   Other Mother 53       MVA   Healthy Sister    Healthy Brother    Healthy Daughter    Hypothyroidism Daughter    Other Brother        killed in war   Pneumonia Sister    Healthy Daughter    Esophageal cancer Neg Hx    Kidney disease Neg Hx    Liver disease Neg Hx     Allergies  Allergen Reactions   Amlodipine Rash   Prochlorperazine Edisylate Anaphylaxis    Compazine--- tongue swells and rash   Aspirin Other (See Comments)    Nose bleeds. Cannot take NSAIDS    Cymbalta [Duloxetine Hcl] Diarrhea, Nausea And Vomiting and Other (See  Comments)    Increased blood pressure   Pamelor [Nortriptyline Hcl] Diarrhea and Nausea Only    Increased Heart rate and BP    Medication list has been reviewed and updated.  Current Outpatient Medications on File Prior to Visit  Medication Sig Dispense Refill   acetaminophen (TYLENOL) 500 MG tablet Take 1 tablet (500 mg total) by mouth every 6 (six) hours as needed (pain). 30 tablet 0   Biotin 1000 MCG tablet Take 1,000 mcg by mouth 2 (two) times daily.     carvedilol (COREG) 6.25 MG tablet Take 1 tablet (6.25 mg total) by mouth  2 (two) times daily with a meal. 180 tablet 3   estradiol (ESTRACE VAGINAL) 0.1 MG/GM vaginal cream Place one gram vaginally up to three times a week as needed to maintain comfort 42.5 g 12   famotidine (PEPCID) 20 MG tablet Take 1 tablet (20 mg total) by mouth daily. 90 tablet 3   gabapentin (NEURONTIN) 300 MG capsule Take 1 capsule (300 mg total) by mouth 3 (three) times daily. 360 capsule 1   HYDROcodone-acetaminophen (NORCO/VICODIN) 5-325 MG tablet Take 1 tablet by mouth 2 (two) times daily as needed for moderate pain. 30 tablet 0   levothyroxine (SYNTHROID) 75 MCG tablet TAKE 1 TABLET BY MOUTH EVERY DAY BEFORE BREAKFAST 90 tablet 1   Multiple Vitamin (MULTIVITAMIN) tablet Take 1 tablet by mouth daily.     ondansetron (ZOFRAN) 4 MG tablet Take 1 tablet (4 mg total) by mouth every 8 (eight) hours as needed for nausea or vomiting. 30 tablet 1   Probiotic Product (PROBIOTIC PO) Take 1 capsule by mouth daily.      traMADol (ULTRAM) 50 MG tablet TAKE 1/2-1 TABLET BY MOUTH EVERY 8 HOURS AS NEEDED. DO NOT COMBINE WITH OTHER PAIN MEDICATION 60 tablet 2   No current facility-administered medications on file prior to visit.    Review of Systems:  As per HPI- otherwise negative.   Physical Examination: There were no vitals filed for this visit. There were no vitals filed for this visit. There is no height or weight on file to calculate BMI. Ideal Body Weight:     GEN: no acute distress. HEENT: Atraumatic, Normocephalic.  Ears and Nose: No external deformity. CV: RRR, No M/G/R. No JVD. No thrill. No extra heart sounds. PULM: CTA B, no wheezes, crackles, rhonchi. No retractions. No resp. distress. No accessory muscle use. ABD: S, NT, ND, +BS. No rebound. No HSM. EXTR: No c/c/e PSYCH: Normally interactive. Conversant.    Assessment and Plan: ***  Signed Abbe Amsterdam, MD

## 2023-01-21 ENCOUNTER — Encounter: Payer: Self-pay | Admitting: Family Medicine

## 2023-01-21 ENCOUNTER — Ambulatory Visit: Payer: Medicare PPO | Admitting: *Deleted

## 2023-01-21 ENCOUNTER — Ambulatory Visit: Payer: Medicare PPO | Admitting: Family Medicine

## 2023-01-21 VITALS — BP 143/63 | HR 82 | Temp 97.9°F

## 2023-01-21 VITALS — BP 116/54 | HR 77 | Ht 60.0 in | Wt 107.0 lb

## 2023-01-21 DIAGNOSIS — Z0000 Encounter for general adult medical examination without abnormal findings: Secondary | ICD-10-CM | POA: Diagnosis not present

## 2023-01-21 DIAGNOSIS — G8929 Other chronic pain: Secondary | ICD-10-CM | POA: Diagnosis not present

## 2023-01-21 DIAGNOSIS — M79641 Pain in right hand: Secondary | ICD-10-CM | POA: Diagnosis not present

## 2023-01-21 DIAGNOSIS — Z23 Encounter for immunization: Secondary | ICD-10-CM | POA: Diagnosis not present

## 2023-01-21 DIAGNOSIS — M25512 Pain in left shoulder: Secondary | ICD-10-CM | POA: Diagnosis not present

## 2023-01-21 DIAGNOSIS — M2550 Pain in unspecified joint: Secondary | ICD-10-CM

## 2023-01-21 MED ORDER — TRAMADOL HCL 50 MG PO TABS
ORAL_TABLET | ORAL | 2 refills | Status: DC
Start: 2023-01-21 — End: 2023-04-22

## 2023-01-21 NOTE — Progress Notes (Signed)
Subjective:   [name] is a 89 y.o. female who presents for Medicare Annual (Subsequent) preventive examination.  Visit Complete: In person Cardiac Risk Factors include: advanced age (>53men, >50 women);hypertension     Objective:    Today's Vitals   01/21/23 1357 01/21/23 1400  BP: (!) 116/54   Pulse: 77   Weight: 107 lb (48.5 kg)   Height: 5' (1.524 m)   PainSc:  7    Body mass index is 20.9 kg/m.     01/21/2023    2:10 PM 08/19/2022   10:00 PM 08/18/2022    2:55 PM 01/23/2022   10:40 AM 12/23/2021    6:42 PM 12/23/2021    4:24 PM 09/30/2021   10:21 AM  Advanced Directives  Does Patient Have a Medical Advance Directive? Yes No No Yes Yes Yes Yes  Type of Estate agent of [place];Living will   Healthcare Power of [place];Living will Healthcare Power of [employer] Power of [employer] Power of [place];Living will  Does patient want to make changes to medical advance directive? No - Patient declined    No - Patient declined    Copy of Healthcare Power of Attorney in Chart? No - copy requested   No - copy requested No - copy requested No - copy requested No - copy requested  Would patient like information on creating a medical advance directive?  No - Patient declined         Current Medications (verified) Outpatient Encounter Medications as of 01/21/2023  Medication Sig   acetaminophen (TYLENOL) 500 MG tablet Take 1 tablet (500 mg total) by mouth every 6 (six) hours as needed (pain).   Biotin 1000 MCG tablet Take 1,000 mcg by mouth 2 (two) times daily.   carvedilol (COREG) 6.25 MG tablet Take 1 tablet (6.25 mg total) by mouth 2 (two) times daily with a meal.   estradiol (ESTRACE VAGINAL) 0.1 MG/GM vaginal cream Place one gram vaginally up to three times a week as needed to maintain comfort   famotidine (PEPCID) 20 MG tablet Take 1 tablet (20 mg total) by mouth daily.   gabapentin (NEURONTIN) 300 MG capsule Take 1 capsule (300 mg  total) by mouth 3 (three) times daily.   HYDROcodone-acetaminophen (NORCO/VICODIN) 5-325 MG tablet Take 1 tablet by mouth 2 (two) times daily as needed for moderate pain.   levothyroxine (SYNTHROID) 75 MCG tablet TAKE 1 TABLET BY MOUTH EVERY DAY BEFORE BREAKFAST   Multiple Vitamin (MULTIVITAMIN) tablet Take 1 tablet by mouth daily.   ondansetron (ZOFRAN) 4 MG tablet Take 1 tablet (4 mg total) by mouth every 8 (eight) hours as needed for nausea or vomiting.   Probiotic Product (PROBIOTIC PO) Take 1 capsule by mouth daily.    traMADol (ULTRAM) 50 MG tablet TAKE 1/2-1 TABLET BY MOUTH EVERY 6-8 HOURS AS NEEDED. DO NOT COMBINE WITH HYDROCODONE   [DISCONTINUED] traMADol (ULTRAM) 50 MG tablet TAKE 1/2-1 TABLET BY MOUTH EVERY 8 HOURS AS NEEDED. DO NOT COMBINE WITH OTHER PAIN MEDICATION   No facility-administered encounter medications on file as of 01/21/2023.    Allergies (verified) Amlodipine, Prochlorperazine edisylate, Aspirin, Cymbalta [duloxetine hcl], and Pamelor [nortriptyline hcl]   History: Past Medical History:  Diagnosis Date   Arthralgia of multiple joints    followed by dr Sharmon Revere   Arthritis    Cardiomyopathy (HCC)    Chronic constipation    Chronic inflammatory arthritis    rhemotolgist-  dr a. Sharmon Revere [facility])  Dry eyes    eye drops used    GERD (gastroesophageal reflux disease)    H/O discoid lupus erythematosus    Hiatal hernia    History of colon polyps    Hypothyroidism    Iron deficiency anemia    LBBB (left bundle branch block) 2010   Mitchell's disease (erythromelalgia) [facility])    neurologist-  dr patel   Nocturia    Non-small cell cancer of right lung [facility]) surgeon-- dr gerhardt/  oncologist-  dr Arbutus Ped--- per lov notes no recurrence/   11-18-2017 per pt denies any symptoms   dx 2015--  Stage IIA (T2b,N0,M0) , +EGFR  mutation in exon 21, non-small cell adenocarcinoma right upper lobe---  s/p  Right upper lobectomy , right middley wedge resection and  node dissection---  no chemo or radiation therapy   OA (osteoarthritis)    hands   Osteoporosis    PONV (postoperative nausea and vomiting)    likes phenergan   Raynaud's phenomenon 1965   Renal insufficiency    Rheumatoid arthritis (HCC)    Sciatica    Scoliosis    Sjogren's syndrome [facility])    Past Surgical History:  Procedure Laterality Date   ANTERIOR HIP REVISION Right 11/27/2017   Procedure: RIGHT HIP ACETABULAR REVISION;  Surgeon: Kathryne Hitch, MD;  Location: [facility] ORS;  Service: Orthopedics;  Laterality: Right;   ANTERIOR HIP REVISION Right 01/24/2018   Procedure: OPEN REDUCTION OF DISLOCATED ANTERIOR HIP WITH REVISION OF LINER AND HIP BALL;  Surgeon: Kathryne Hitch, MD;  Location: [facility];  Service: Orthopedics;  Laterality: Right;   APPENDECTOMY  1950s   BIOPSY  04/14/2018   Procedure: BIOPSY;  Surgeon: Benancio Deeds, MD;  Location: [facility] ENDOSCOPY;  Service: Gastroenterology;;   BIOPSY  04/16/2018   Procedure: BIOPSY;  Surgeon: Lemar Lofty., MD;  Location: [facility] ENDOSCOPY;  Service: Gastroenterology;;   CARDIOVASCULAR STRESS TEST  12/2008    mild fixed basal to mid septal perfusion defect felt likely due to artifact from LBBB, no ischemia, EF 58%   COLONOSCOPY     COLONOSCOPY WITH PROPOFOL N/A 04/16/2018   Procedure: COLONOSCOPY WITH PROPOFOL;  Surgeon: Lemar Lofty., MD;  Location: [facility] ENDOSCOPY;  Service: Gastroenterology;  Laterality: N/A;   ESOPHAGOGASTRODUODENOSCOPY (EGD) WITH PROPOFOL N/A 04/14/2018   Procedure: ESOPHAGOGASTRODUODENOSCOPY (EGD) WITH PROPOFOL;  Surgeon: Benancio Deeds, MD;  Location: [facility] ENDOSCOPY;  Service: Gastroenterology;  Laterality: N/A;   FEMORAL-POPLITEAL BYPASS GRAFT Right 04/10/2018   Procedure: REPAIR RIGHT FEMORAL ARTERY PSEUDOANEURYSM, RETROPERITONEAL EXPOSURE OF ILIAC ARTERY, RIGHT POPLITEAL EMBOLECTOMY;  Surgeon: Chuck Hint, MD;  Location: [facility] OR;  Service: Vascular;  Laterality: Right;    FLEXIBLE SIGMOIDOSCOPY N/A 06/17/2019   Procedure: FLEXIBLE SIGMOIDOSCOPY;  Surgeon: Shellia Cleverly, DO;  Location: [facility] ENDOSCOPY;  Service: Gastroenterology;  Laterality: N/A;   HEMOSTASIS CLIP PLACEMENT  06/17/2019   Procedure: HEMOSTASIS CLIP PLACEMENT;  Surgeon: Shellia Cleverly, DO;  Location: [facility] ENDOSCOPY;  Service: Gastroenterology;;   LYMPH NODE DISSECTION Right 06/07/2013   Procedure: LYMPH NODE DISSECTION;  Surgeon: Delight Ovens, MD;  Location: [facility] OR;  Service: Thoracic;  Laterality: Right;   PATCH ANGIOPLASTY Right 04/10/2018   Procedure: PATCH  ANGIOPLASTY OF RIGHT FEMORAL ARTERY USING BOVINE PATCH, PATCH ANGIOPLASTY OF RIGHT POPLITEAL ARTERY USING BOVINE PATCH;  Surgeon: Chuck Hint, MD;  Location: [facility] OR;  Service: Vascular;  Laterality: Right;   SCHLEROTHERAPY  06/17/2019   Procedure: Theresia Majors OF VARICES;  Surgeon: Shellia Cleverly, DO;  Location[facility] ENDOSCOPY;  Service: Gastroenterology;;   THORACIC SYMPATHETECTOMY  1965   "large incision from chest to up to shoulder, the nerves were tied together, for raynaud's   THORACOTOMY  06/07/2013   Procedure: MINI/LIMITED THORACOTOMY; right middle lobe wedge resection;  Surgeon: Delight Ovens, MD;  Location: [facility] OR;  Service: Thoracic;;   TONSILLECTOMY  child   TOTAL ABDOMINAL HYSTERECTOMY  1980's    W/ BSO   TOTAL HIP ARTHROPLASTY Right 04/28/2014   Procedure: RIGHT TOTAL HIP ARTHROPLASTY ANTERIOR APPROACH;  Surgeon: Kathryne Hitch, MD;  Location: [facility];  Service: Orthopedics;  Laterality: Right;   TRANSTHORACIC ECHOCARDIOGRAM  12/11/2008   ef 45-50%, grade 1 diastolic dysfunction/  mild LAE/  mild AR and MR/  trivial TR   VIDEO ASSISTED THORACOSCOPY (VATS)/WEDGE RESECTION Right 06/07/2013   Procedure: VIDEO ASSISTED THORACOSCOPY (VATS)/right upper lobectomy, On Q;  Surgeon: Delight Ovens, MD;  Location: [facility] OR;  Service: Thoracic;  Laterality: Right;   VIDEO BRONCHOSCOPY N/A 06/07/2013   Procedure:  VIDEO BRONCHOSCOPY;  Surgeon: Delight Ovens, MD;  Location: [facility] OR;  Service: Thoracic;  Laterality: N/A;   VIDEO BRONCHOSCOPY WITH ENDOBRONCHIAL NAVIGATION N/A 05/04/2013   Procedure: VIDEO BRONCHOSCOPY WITH ENDOBRONCHIAL NAVIGATION;  Surgeon: Delight Ovens, MD;  Location: [facility] OR;  Service: Thoracic;  Laterality: N/A;   Family History  Problem Relation Age of Onset   Coronary artery disease Father    Colon cancer Father    Diabetes Father    Cancer Father        colon   Other Mother 35       MVA   Healthy Sister    Healthy Brother    Healthy Daughter    Hypothyroidism Daughter    Other Brother        killed in war   Pneumonia Sister    Healthy Daughter    Esophageal cancer Neg Hx    Kidney disease Neg Hx    Liver disease Neg Hx    Social History   Socioeconomic History   Marital status: Widowed    Spouse name: Not on file   Number of children: 2   Years of education: Not on file   Highest education level: Master's degree (e.g., MA, MS, MEng, MEd, MSW, MBA)  Occupational History   Occupation: n/a  Tobacco Use   Smoking status: Never    Passive exposure: Past   Smokeless tobacco: Never  Vaping Use   Vaping status: Never Used  Substance and Sexual Activity   Alcohol use: Not Currently   Drug use: Never   Sexual activity: Not Currently    Birth control/protection: Surgical  Other Topics Concern   Not on file  Social History Narrative   Lives with husband, daughter and grandchild local.   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   Social Determinants of Health   Financial Resource Strain: Low Risk  (08/13/2022)   Overall Financial Resource Strain (CARDIA)    Difficulty of Paying Living Expenses: Not hard at all  Food Insecurity: No Food Insecurity (08/25/2022)   Hunger Vital Sign    Worried About Running Out of Food in the Last Year: Never true    Ran Out of Food in the Last Year: Never true  Transportation Needs: No  Transportation Needs (08/25/2022)   PRAPARE - Administrator, Civil Service (Medical): No    Lack of Transportation (Non-Medical): No  Physical Activity: Unknown (08/13/2022)  Exercise Vital Sign    Days of Exercise per Week: Patient declined    Minutes of Exercise per Session: Not on file  Stress: Patient Declined (08/13/2022)   [employer] of Occupational Health - Occupational Stress Questionnaire    Feeling of Stress : Patient declined  Social Connections: Moderately Integrated (08/13/2022)   Social Connection and Isolation Panel [NHANES]    Frequency of Communication with Friends and Family: More than three times a week    Frequency of Social Gatherings with Friends and Family: More than three times a week    Attends Religious Services: More than 4 times per year    Active Member of [employer] or Organizations: Yes    Attends Banker Meetings: More than 4 times per year    Marital Status: Widowed    Tobacco Counseling Counseling given: Not Answered   Clinical Intake:  Pre-visit preparation completed: Yes  Pain : 0-10 Pain Score: 7  Pain Type: Chronic pain Pain Location: Other (Comment) (multiple joint arthritis) Pain Onset: More than a month ago Pain Frequency: Constant  BMI - recorded: 20.9 Nutritional Status: BMI of 19-24  Normal Nutritional Risks: None Diabetes: No  How often do you need to have someone help you when you read instructions, pamphlets, or other written materials from your doctor or pharmacy?: 1 - Never  Interpreter Needed?: No  Information entered by :: Donne Anon, CMA   Activities of Daily Living    01/21/2023    2:02 PM 08/19/2022    6:42 AM  In your present state of health, do you have any difficulty performing the following activities:  Hearing? 0 0  Vision? 0 0  Difficulty concentrating or making decisions? 0 1  Walking or climbing stairs? 1 0  Dressing or bathing? 0 0  Doing errands, shopping? 1 1  Comment  daughter Biomedical scientist and eating ? N   Using the Toilet? N   In the past six months, have you accidently leaked urine? Y   Do you have problems with loss of bowel control? N   Managing your Medications? N   Managing your Finances? N   Housekeeping or managing your Housekeeping? N     Patient Care Team: Copland, [name], MD as PCP - General (Family Medicine) Jens Som Madolyn Frieze, MD as PCP - Cardiology (Cardiology) Oretha Milch, MD as Consulting Physician (Pulmonary Disease) Francee Gentile, MD as Consulting Physician (Internal Medicine) Si Gaul, MD as Consulting Physician (Oncology) Delight Ovens, MD (Inactive) as Consulting Physician (Cardiothoracic Surgery) Letta Kocher, MD as Consulting Physician (Rehabilitation) Pollyann Savoy, MD as Consulting Physician (Rheumatology)  Indicate any recent Medical Services you may have received from other than [facility] providers in the past year (date may be approximate).     Assessment:   This is a routine wellness examination for Katerria.  Hearing/Vision screen No results found.   Goals Addressed   None    Depression Screen    01/21/2023    2:11 PM 12/02/2022    2:33 PM 09/22/2022    9:58 AM 09/10/2022   10:49 AM 08/11/2022   10:03 AM 05/07/2021    1:11 PM 06/20/2020   11:49 AM  PHQ 2/9 Scores  PHQ - 2 Score 0 0 0 0 0 0 0    Fall Risk    01/21/2023    2:09 PM 12/02/2022    2:33 PM 09/22/2022    9:58 AM 09/10/2022   10:49 AM 08/11/2022  10:03 AM  Fall Risk   Falls in the past year? 0 0 1 1 0  Number falls in past yr: 0 0 0 1 0  Injury with Fall? 0 0 0 1 0  Risk for fall due to : Impaired balance/gait  History of fall(s) History of fall(s)   Follow up Falls evaluation completed  Falls evaluation completed Falls evaluation completed     MEDICARE RISK AT HOME: Medicare Risk at Home Any stairs in or around the home?: Yes If so, are there any without handrails?: No Home free of loose throw rugs in  walkways, pet beds, electrical cords, etc?: Yes Adequate lighting in your home to reduce risk of falls?: Yes Life alert?: No Use of a cane, walker or w/c?: No Grab bars in the bathroom?: Yes Shower chair or bench in shower?: Yes Elevated toilet seat or a handicapped toilet?: Yes  TIMED UP AND GO:  Was the test performed?  Yes  Length of time to ambulate 10 feet: 8 sec Gait slow and steady with assistive device    Cognitive Function:    07/09/2017    3:33 PM  MMSE - Mini Mental State Exam  Orientation to time 5  Orientation to Place 5  Registration 3  Attention/ Calculation 5  Recall 3  Language- name 2 objects 2  Language- repeat 1  Language- follow 3 step command 3  Language- read & follow direction 1  Write a sentence 1  Copy design 1  Total score 30        01/21/2023    2:15 PM 05/07/2021    1:16 PM  6CIT Screen  What Year? 0 points 0 points  What month? 0 points 0 points  What time? 0 points 0 points  Count back from 20 0 points 0 points  Months in reverse 0 points 0 points  Repeat phrase 2 points 0 points  Total Score 2 points 0 points    Immunizations Immunization History  Administered Date(s) Administered   Fluad Quad(high Dose 65+) 11/03/2018, 12/14/2019   Fluad Trivalent(High Dose 65+) 01/21/2023   Fluzone Influenza virus vaccine,trivalent (IIV3), split virus 11/14/2015   Influenza Split 11/21/2011   Influenza Whole 11/29/2007, 11/29/2008, 11/29/2009   Influenza, High Dose Seasonal PF 10/30/2012, 01/02/2015, 11/13/2016, 11/12/2017   Influenza,inj,Quad PF,6+ Mos 11/22/2013, 11/14/2015   Influenza-Unspecified 11/13/2016, 11/12/2017, 12/14/2020   PFIZER(Purple Top)SARS-COV-2 Vaccination 04/12/2019, 05/03/2019, 01/20/2020, 12/14/2020   Pneumococcal Conjugate-13 05/22/2015   Pneumococcal Polysaccharide-23 06/13/2013   Tdap 08/17/2017   Zoster, Live 01/27/2014    TDAP status: Up to date  Flu Vaccine status: Up to date  Pneumococcal vaccine status:  Up to date  Covid-19 vaccine status: Information provided on how to obtain vaccines.   Qualifies for Shingles Vaccine? Yes   Zostavax completed Yes   Shingrix Completed?: No.    Education has been provided regarding the importance of this vaccine. Patient has been advised to call insurance company to determine out of pocket expense if they have not yet received this vaccine. Advised may also receive vaccine at local pharmacy or Health Dept. Verbalized acceptance and understanding.  Screening Tests Health Maintenance  Topic Date Due   Zoster Vaccines- Shingrix (1 of 2) 11/18/1949   Medicare Annual Wellness (AWV)  05/08/2022   COVID-19 Vaccine (5 - 2023-24 season) 10/19/2022   DTaP/Tdap/Td (2 - Td or Tdap) 08/18/2027   Pneumonia Vaccine 6+ Years old  Completed   INFLUENZA VACCINE  Completed   DEXA SCAN  Completed  HPV VACCINES  Aged Out    Health Maintenance  Health Maintenance Due  Topic Date Due   Zoster Vaccines- Shingrix (1 of 2) 11/18/1949   Medicare Annual Wellness (AWV)  05/08/2022   COVID-19 Vaccine (5 - 2023-24 season) 10/19/2022    Colorectal cancer screening: No longer required.   Mammogram status: No longer required due to age.  Bone Density status: Pt declines  Lung Cancer Screening: (Low Dose CT Chest recommended if Age 41-80 years, 20 pack-year currently smoking OR have quit w/in 15years.) does not qualify.   Additional Screening:  Hepatitis C Screening: does not qualify  Vision Screening: Recommended annual ophthalmology exams for early detection of glaucoma and other disorders of the eye. Is the patient up to date with their annual eye exam?  Yes  Who is the provider or what is the name of the office in which the patient attends annual eye exams? Dr. Ward Givens If pt is not established with a provider, would they like to be referred to a provider to establish care? No .   Dental Screening: Recommended annual dental exams for proper oral hygiene  Diabetic  Foot Exam: N/a  Community Resource Referral / Chronic Care Management: CRR required this visit?  No   CCM required this visit?  No     Plan:     I have personally reviewed and noted the following in the patient's chart:   Medical and social history Use of alcohol, tobacco or illicit drugs  Current medications and supplements including opioid prescriptions. Patient is currently taking opioid prescriptions. Information provided to patient regarding non-opioid alternatives. Patient advised to discuss non-opioid treatment plan with their provider. Functional ability and status Nutritional status Physical activity Advanced directives List of other physicians Hospitalizations, surgeries, and ER visits in previous 12 months Vitals Screenings to include cognitive, depression, and falls Referrals and appointments  In addition, I have reviewed and discussed with patient certain preventive protocols, quality metrics, and best practice recommendations. A written personalized care plan for preventive services as well as general preventive health recommendations were provided to patient.     Donne Anon, CMA   01/21/2023   After Visit Summary: (In Person-Declined) Patient declined AVS at this time.  Nurse Notes: None

## 2023-01-21 NOTE — Patient Instructions (Signed)
[name] , Thank you for taking time to come for your Medicare Wellness Visit. I appreciate your ongoing commitment to your health goals. Please review the following plan we discussed and let me know if I can assist you in the future.   This is a list of the screening recommended for you and due dates:  Health Maintenance  Topic Date Due   Zoster (Shingles) Vaccine (1 of 2) 11/18/1949   COVID-19 Vaccine (5 - 2023-24 season) 10/19/2022   Medicare Annual Wellness Visit  01/21/2024   DTaP/Tdap/Td vaccine (2 - Td or Tdap) 08/18/2027   Pneumonia Vaccine  Completed   Flu Shot  Completed   DEXA scan (bone density measurement)  Completed   HPV Vaccine  Aged Out  .  Next appointment: Follow up in one year for your annual wellness visit.   Preventive Care 87 Years and Older, Female Preventive care refers to lifestyle choices and visits with your health care provider that can promote health and wellness. What does preventive care include? A yearly physical exam. This is also called an annual well check. Dental exams once or twice a year. Routine eye exams. Ask your health care provider how often you should have your eyes checked. Personal lifestyle choices, including: Daily care of your teeth and gums. Regular physical activity. Eating a healthy diet. Avoiding tobacco and drug use. Limiting alcohol use. Practicing safe sex. Taking low-dose aspirin every day. Taking vitamin and mineral supplements as recommended by your health care provider. What happens during an annual well check? The services and screenings done by your health care provider during your annual well check will depend on your age, overall health, lifestyle risk factors, and family history of disease. Counseling  Your health care provider may ask you questions about your: Alcohol use. Tobacco use. Drug use. Emotional well-being. Home and relationship well-being. Sexual activity. Eating habits. History of  falls. Memory and ability to understand (cognition). Work and work Astronomer. Reproductive health. Screening  You may have the following tests or measurements: Height, weight, and BMI. Blood pressure. Lipid and cholesterol levels. These may be checked every 5 years, or more frequently if you are over 18 years old. Skin check. Lung cancer screening. You may have this screening every year starting at age 75 if you have a 30-pack-year history of smoking and currently smoke or have quit within the past 15 years. Fecal occult blood test (FOBT) of the stool. You may have this test every year starting at age 57. Flexible sigmoidoscopy or colonoscopy. You may have a sigmoidoscopy every 5 years or a colonoscopy every 10 years starting at age 74. Hepatitis C blood test. Hepatitis B blood test. Sexually transmitted disease (STD) testing. Diabetes screening. This is done by checking your blood sugar (glucose) after you have not eaten for a while (fasting). You may have this done every 1-3 years. Bone density scan. This is done to screen for osteoporosis. You may have this done starting at age 54. Mammogram. This may be done every 1-2 years. Talk to your health care provider about how often you should have regular mammograms. Talk with your health care provider about your test results, treatment options, and if necessary, the need for more tests. Vaccines  Your health care provider may recommend certain vaccines, such as: Influenza vaccine. This is recommended every year. Tetanus, diphtheria, and acellular pertussis (Tdap, Td) vaccine. You may need a Td booster every 10 years. Zoster vaccine. You may need this after age 49. Pneumococcal  13-valent conjugate (PCV13) vaccine. One dose is recommended after age 105. Pneumococcal polysaccharide (PPSV23) vaccine. One dose is recommended after age 14. Talk to your health care provider about which screenings and vaccines you need and how often you need  them. This information is not intended to replace advice given to you by your health care provider. Make sure you discuss any questions you have with your health care provider. Document Released: 03/02/2015 Document Revised: 10/24/2015 Document Reviewed: 12/05/2014 Elsevier Interactive Patient Education  2017 [employer].  Fall Prevention in the Home Falls can cause injuries. They can happen to people of all ages. There are many things you can do to make your home safe and to help prevent falls. What can I do on the outside of my home? Regularly fix the edges of walkways and driveways and fix any cracks. Remove anything that might make you trip as you walk through a door, such as a raised step or threshold. Trim any bushes or trees on the path to your home. Use bright outdoor lighting. Clear any walking paths of anything that might make someone trip, such as rocks or tools. Regularly check to see if handrails are loose or broken. Make sure that both sides of any steps have handrails. Any raised decks and porches should have guardrails on the edges. Have any leaves, snow, or ice cleared regularly. Use sand or salt on walking paths during winter. Clean up any spills in your garage right away. This includes oil or grease spills. What can I do in the bathroom? Use night lights. Install grab bars by the toilet and in the tub and shower. Do not use towel bars as grab bars. Use non-skid mats or decals in the tub or shower. If you need to sit down in the shower, use a plastic, non-slip stool. Keep the floor dry. Clean up any water that spills on the floor as soon as it happens. Remove soap buildup in the tub or shower regularly. Attach bath mats securely with double-sided non-slip rug tape. Do not have throw rugs and other things on the floor that can make you trip. What can I do in the bedroom? Use night lights. Make sure that you have a light by your bed that is easy to reach. Do not use  any sheets or blankets that are too big for your bed. They should not hang down onto the floor. Have a firm chair that has side arms. You can use this for support while you get dressed. Do not have throw rugs and other things on the floor that can make you trip. What can I do in the kitchen? Clean up any spills right away. Avoid walking on wet floors. Keep items that you use a lot in easy-to-reach places. If you need to reach something above you, use a strong step stool that has a grab bar. Keep electrical cords out of the way. Do not use floor polish or wax that makes floors slippery. If you must use wax, use non-skid floor wax. Do not have throw rugs and other things on the floor that can make you trip. What can I do with my stairs? Do not leave any items on the stairs. Make sure that there are handrails on both sides of the stairs and use them. Fix handrails that are broken or loose. Make sure that handrails are as long as the stairways. Check any carpeting to make sure that it is firmly attached to the stairs. Fix any carpet  that is loose or worn. Avoid having throw rugs at the top or bottom of the stairs. If you do have throw rugs, attach them to the floor with carpet tape. Make sure that you have a light switch at the top of the stairs and the bottom of the stairs. If you do not have them, ask someone to add them for you. What else can I do to help prevent falls? Wear shoes that: Do not have high heels. Have rubber bottoms. Are comfortable and fit you well. Are closed at the toe. Do not wear sandals. If you use a stepladder: Make sure that it is fully opened. Do not climb a closed stepladder. Make sure that both sides of the stepladder are locked into place. Ask someone to hold it for you, if possible. Clearly mark and make sure that you can see: Any grab bars or handrails. First and last steps. Where the edge of each step is. Use tools that help you move around (mobility aids)  if they are needed. These include: Canes. Walkers. Scooters. Crutches. Turn on the lights when you go into a dark area. Replace any light bulbs as soon as they burn out. Set up your furniture so you have a clear path. Avoid moving your furniture around. If any of your floors are uneven, fix them. If there are any pets around you, be aware of where they are. Review your medicines with your doctor. Some medicines can make you feel dizzy. This can increase your chance of falling. Ask your doctor what other things that you can do to help prevent falls. This information is not intended to replace advice given to you by your health care provider. Make sure you discuss any questions you have with your health care provider. Document Released: 11/30/2008 Document Revised: 07/12/2015 Document Reviewed: 03/10/2014 Elsevier Interactive Patient Education  2017 [employer].

## 2023-01-21 NOTE — Addendum Note (Signed)
Addended by: CREFT, Feliberto Harts on: 01/21/2023 02:17 PM   Modules accepted: Orders

## 2023-01-23 ENCOUNTER — Encounter: Payer: Self-pay | Admitting: Family Medicine

## 2023-01-23 ENCOUNTER — Telehealth: Payer: Self-pay | Admitting: *Deleted

## 2023-01-23 NOTE — Telephone Encounter (Signed)
Patient contacted the office and left a message stating she would like to get restarted on her infusions.   Returned call to patient and she states she saw her PCP which advised her she could resume her infusions. Patient states she has called and scheduled that infusion. Patient is due for a follow up in our office and has scheduled an appointment for 01/27/2023. Patient will have PCP send a message advising of clearance to restart infusion.

## 2023-01-23 NOTE — Telephone Encounter (Signed)
Authorization for IV [name] is active through 02/17/2023. Would recommend on deferring loading dose (her last Orenica infusion was > 3 months) due to risk for infection. Orencia was previously held due to recurrent C diff infections (at least four episodes)  Chesley Mires, PharmD, MPH, BCPS, CPP Clinical Pharmacist (Rheumatology and Pulmonology)

## 2023-01-25 NOTE — Telephone Encounter (Signed)
Yes, okay to defer on loading dose.

## 2023-01-26 NOTE — Progress Notes (Unsigned)
Office Visit Note  Patient: [name]             Date of Birth: 26-Nov-1930           MRN: [id number]             PCP: Pearline Cables, MD Referring: Pearline Cables, MD Visit Date: 01/27/2023 Occupation: @GUAROCC @  Subjective:  Discuss restarting Orencia   History of Present Illness: [name] is a 89 y.o. female with history of seronegative rheumatoid arthritis.  Patient is scheduled to resume IV Orencia infusions on Monday.  Patient denies any recent infections.  Patient states that she has been experiencing recurrent flares of rheumatoid arthritis primarily involving both shoulders and her right hand.  She states that she has had a flare in her right hand for the past 3 to 4 days.  The swelling has gradually started to improve but she continues to have discomfort as well as walking of the right third and fourth digits.  Patient states that she has been referred to orthopedics for trigger finger injections but is not scheduled until 02/23/2023.  Patient has been taking tramadol every 4 hours for pain relief and hydrocodone if she has moderate to severe pain.  She has been using a walker to assist with ambulation at home and a cane while out of the house.   Patient is scheduled to receive her next Prolia injection on Monday.    Activities of Daily Living:  Patient reports morning stiffness for 2-3 hours.   Patient Denies nocturnal pain.  Difficulty dressing/grooming: Reports Difficulty climbing stairs: Reports Difficulty getting out of chair: Reports Difficulty using hands for taps, buttons, cutlery, and/or writing: Reports  Review of Systems  Constitutional:  Negative for fatigue.  HENT:  Negative for mouth sores and mouth dryness.   Eyes:  Positive for dryness.  Respiratory:  Negative for shortness of breath.   Cardiovascular:  Positive for palpitations. Negative for chest pain.  Gastrointestinal:  Negative for blood in stool, constipation and diarrhea.   Endocrine: Negative for increased urination.  Genitourinary:  Negative for involuntary urination.  Musculoskeletal:  Positive for joint pain, gait problem, joint pain, joint swelling, myalgias, morning stiffness and myalgias. Negative for muscle weakness and muscle tenderness.  Skin:  Positive for hair loss and sensitivity to sunlight. Negative for color change and rash.  Allergic/Immunologic: Positive for susceptible to infections.  Neurological:  Negative for dizziness and headaches.  Hematological:  Negative for swollen glands.  Psychiatric/Behavioral:  Negative for depressed mood and sleep disturbance. The patient is not nervous/anxious.     PMFS History:  Patient Active Problem List   Diagnosis Date Noted   Diarrhea of presumed infectious origin 08/22/2022   Colitis 08/18/2022   Acute congestive heart failure (HCC) 12/29/2021   Acute respiratory failure (HCC) 12/24/2021   Acute respiratory failure with hypoxia (HCC) 12/23/2021   Acute on chronic combined systolic and diastolic CHF (congestive heart failure) (HCC) 12/23/2021   Rectal pain 10/10/2020   Acute posthemorrhagic anemia    Rectal bleeding 06/17/2019   Grade II internal hemorrhoids    Rectal ulcer    Popliteal artery occlusion, 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/10/2018   HTN (hypertension) 04/10/2018   Femoral artery pseudo-aneurysm, 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/09/2018   Anemia of chronic disease 02/24/2018   Unstable right hip arthroplasty 01/24/2018   History of revision of total replacement of right hip joint 01/24/2018   Hypovolemic shock (HCC)    Hyperkalemia    Hyponatremia  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   Status post revision of total hip 11/27/2017   Elevated cholesterol 10/11/2015   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HCC) 10/04/2014   Bilateral leg edema 08/01/2014   Elevated BP 08/01/2014   Rheumatoid arthritis involving multiple joints (HCC) 05/30/2014   Status post total replacement of right hip 04/28/2014   Long-term  use of high-risk medication 11/11/2013   Symptomatic anemia 11/11/2013   Neuropathic pain 07/07/2013   Constipation due to pain medication 07/07/2013   Protein-calorie malnutrition, severe (HCC) 06/08/2013   Lung cancer, Right upper lobe 05/08/2013   Sciatica of right side 08/13/2011   Osteoporosis 03/07/2010   PARESTHESIA 03/07/2010   Low back pain 03/16/2009   CT, CHEST, ABNORMAL 12/18/2008   ABNORMAL ECHOCARDIOGRAM 12/14/2008   SJOGREN'S SYNDROME 11/29/2008   HYPOGLYCEMIA 06/29/2006   RAYNAUD'S DISEASE 06/29/2006    Past Medical History:  Diagnosis Date   Arthralgia of multiple joints    followed by dr Sharmon Revere   Arthritis    C. difficile colitis 08/2022   Cardiomyopathy (HCC)    Chronic constipation    Chronic inflammatory arthritis    rhemotolgist-  dr a. Sharmon Revere ([facility])   Dry eyes    eye drops used    GERD (gastroesophageal reflux disease)    H/O discoid lupus erythematosus    Hiatal hernia    History of colon polyps    Hypothyroidism    Iron deficiency anemia    LBBB (left bundle branch block) 2010   Mitchell's disease (erythromelalgia) [facility])    neurologist-  dr patel   Nocturia    Non-small cell cancer of right lung [facility]) surgeon-- dr gerhardt/  oncologist-  dr Arbutus Ped--- per lov notes no recurrence/   11-18-2017 per pt denies any symptoms   dx 2015--  Stage IIA (T2b,N0,M0) , +EGFR  mutation in exon 21, non-small cell adenocarcinoma right upper lobe---  s/p  Right upper lobectomy , right middley wedge resection and node dissection---  no chemo or radiation therapy   OA (osteoarthritis)    hands   Osteoporosis    PONV (postoperative nausea and vomiting)    likes phenergan   Raynaud's phenomenon 1965   Renal insufficiency    Rheumatoid arthritis (HCC)    Sciatica    Scoliosis    Sjogren's syndrome (HCC)     Family History  Problem Relation Age of Onset   Other Mother 38       MVA   Coronary artery disease Father    Colon cancer Father     Diabetes Father    Cancer Father        colon   Healthy Sister    Pneumonia Sister    Healthy Brother    Other Brother        killed in war   Healthy Daughter    Hypothyroidism Daughter    Healthy Daughter    Esophageal cancer Neg Hx    Kidney disease Neg Hx    Liver disease Neg Hx    Past Surgical History:  Procedure Laterality Date   ANTERIOR HIP REVISION Right 11/27/2017   Procedure: RIGHT HIP ACETABULAR REVISION;  Surgeon: Kathryne Hitch, MD;  Location: [facility] ORS;  Service: Orthopedics;  Laterality: Right;   ANTERIOR HIP REVISION Right 01/24/2018   Procedure: OPEN REDUCTION OF DISLOCATED ANTERIOR HIP WITH REVISION OF LINER AND HIP BALL;  Surgeon: Kathryne Hitch, MD;  Location: [facility];  Service: Orthopedics;  Laterality: Right;   APPENDECTOMY  1950s   BIOPSY  04/14/2018   Procedure: BIOPSY;  Surgeon: Benancio Deeds, MD;  Location: [facility] ENDOSCOPY;  Service: Gastroenterology;;   BIOPSY  04/16/2018   Procedure: BIOPSY;  Surgeon: Lemar Lofty., MD;  Location: [facility] ENDOSCOPY;  Service: Gastroenterology;;   CARDIOVASCULAR STRESS TEST  12/2008    mild fixed basal to mid septal perfusion defect felt likely due to artifact from LBBB, no ischemia, EF 58%   COLONOSCOPY     COLONOSCOPY WITH PROPOFOL N/A 04/16/2018   Procedure: COLONOSCOPY WITH PROPOFOL;  Surgeon: Lemar Lofty., MD;  Location: [facility] ENDOSCOPY;  Service: Gastroenterology;  Laterality: N/A;   ESOPHAGOGASTRODUODENOSCOPY (EGD) WITH PROPOFOL N/A 04/14/2018   Procedure: ESOPHAGOGASTRODUODENOSCOPY (EGD) WITH PROPOFOL;  Surgeon: Benancio Deeds, MD;  Location: [facility] ENDOSCOPY;  Service: Gastroenterology;  Laterality: N/A;   FEMORAL-POPLITEAL BYPASS GRAFT Right 04/10/2018   Procedure: REPAIR RIGHT FEMORAL ARTERY PSEUDOANEURYSM, RETROPERITONEAL EXPOSURE OF ILIAC ARTERY, RIGHT POPLITEAL EMBOLECTOMY;  Surgeon: Chuck Hint, MD;  Location: [facility] OR;  Service: Vascular;  Laterality: Right;   FLEXIBLE  SIGMOIDOSCOPY N/A 06/17/2019   Procedure: FLEXIBLE SIGMOIDOSCOPY;  Surgeon: Shellia Cleverly, DO;  Location: [facility] ENDOSCOPY;  Service: Gastroenterology;  Laterality: N/A;   HEMOSTASIS CLIP PLACEMENT  06/17/2019   Procedure: HEMOSTASIS CLIP PLACEMENT;  Surgeon: Shellia Cleverly, DO;  Location: [facility] ENDOSCOPY;  Service: Gastroenterology;;   LYMPH NODE DISSECTION Right 06/07/2013   Procedure: LYMPH NODE DISSECTION;  Surgeon: Delight Ovens, MD;  Location: [facility] OR;  Service: Thoracic;  Laterality: Right;   PATCH ANGIOPLASTY Right 04/10/2018   Procedure: PATCH  ANGIOPLASTY OF RIGHT FEMORAL ARTERY USING BOVINE PATCH, PATCH ANGIOPLASTY OF RIGHT POPLITEAL ARTERY USING BOVINE PATCH;  Surgeon: Chuck Hint, MD;  Location: [facility] OR;  Service: Vascular;  Laterality: Right;   SCHLEROTHERAPY  06/17/2019   Procedure: Theresia Majors OF VARICES;  Surgeon: Shellia Cleverly, DO;  Location: [facility] ENDOSCOPY;  Service: Gastroenterology;;   THORACIC SYMPATHETECTOMY  1965   "large incision from chest to up to shoulder, the nerves were tied together, for raynaud's   THORACOTOMY  06/07/2013   Procedure: MINI/LIMITED THORACOTOMY; right middle lobe wedge resection;  Surgeon: Delight Ovens, MD;  Location: [facility] OR;  Service: Thoracic;;   TONSILLECTOMY  child   TOTAL ABDOMINAL HYSTERECTOMY  1980's    W/ BSO   TOTAL HIP ARTHROPLASTY Right 04/28/2014   Procedure: RIGHT TOTAL HIP ARTHROPLASTY ANTERIOR APPROACH;  Surgeon: Kathryne Hitch, MD;  Location: [facility];  Service: Orthopedics;  Laterality: Right;   TRANSTHORACIC ECHOCARDIOGRAM  12/11/2008   ef 45-50%, grade 1 diastolic dysfunction/  mild LAE/  mild AR and MR/  trivial TR   VIDEO ASSISTED THORACOSCOPY (VATS)/WEDGE RESECTION Right 06/07/2013   Procedure: VIDEO ASSISTED THORACOSCOPY (VATS)/right upper lobectomy, On Q;  Surgeon: Delight Ovens, MD;  Location: [facility] OR;  Service: Thoracic;  Laterality: Right;   VIDEO BRONCHOSCOPY N/A 06/07/2013   Procedure: VIDEO  BRONCHOSCOPY;  Surgeon: Delight Ovens, MD;  Location: [facility] OR;  Service: Thoracic;  Laterality: N/A;   VIDEO BRONCHOSCOPY WITH ENDOBRONCHIAL NAVIGATION N/A 05/04/2013   Procedure: VIDEO BRONCHOSCOPY WITH ENDOBRONCHIAL NAVIGATION;  Surgeon: Delight Ovens, MD;  Location: [facility] OR;  Service: Thoracic;  Laterality: N/A;   Social History   Social History Narrative   Lives with husband, daughter and grandchild local.   Highest level of education:  masters in education admin and Engineer, technical sales History  Administered Date(s) Administered   Fluad Quad(high Dose 65+) 11/03/2018, 12/14/2019  Fluad Trivalent(High Dose 65+) 01/21/2023   Fluzone Influenza virus vaccine,trivalent (IIV3), split virus 11/14/2015   Influenza Split 11/21/2011   Influenza Whole 11/29/2007, 11/29/2008, 11/29/2009   Influenza, High Dose Seasonal PF 10/30/2012, 01/02/2015, 11/13/2016, 11/12/2017   Influenza,inj,Quad PF,6+ Mos 11/22/2013, 11/14/2015   Influenza-Unspecified 11/13/2016, 11/12/2017, 12/14/2020   PFIZER(Purple Top)SARS-COV-2 Vaccination 04/12/2019, 05/03/2019, 01/20/2020, 12/14/2020   Pneumococcal Conjugate-13 05/22/2015   Pneumococcal Polysaccharide-23 06/13/2013   Tdap 08/17/2017   Zoster, Live 01/27/2014     Objective: Vital Signs: BP 125/76 (BP Location: Left Arm, Patient Position: Sitting, Cuff Size: Normal)   Pulse 80   Resp 14   Ht 5' (1.524 m)   Wt 107 lb (48.5 kg)   BMI 20.90 kg/m    Physical Exam Vitals and nursing note reviewed.  Constitutional:      Appearance: She is well-developed.  HENT:     Head: Normocephalic and atraumatic.  Eyes:     Conjunctiva/sclera: Conjunctivae normal.  Cardiovascular:     Rate and Rhythm: Normal rate and regular rhythm.     Heart sounds: Normal heart sounds.  Pulmonary:     Effort: Pulmonary effort is normal.     Breath sounds: Normal breath sounds.  Abdominal:     General: Bowel sounds are normal.     Palpations: Abdomen is soft.   Musculoskeletal:     Cervical back: Normal range of motion.  Lymphadenopathy:     Cervical: No cervical adenopathy.  Skin:    General: Skin is warm and dry.     Capillary Refill: Capillary refill takes less than 2 seconds.  Neurological:     Mental Status: She is alert and oriented to person, place, and time.  Psychiatric:        Behavior: Behavior normal.      Musculoskeletal Exam: C-spine has limited range of motion.  Thoracic kyphosis noted.  Shoulders have severely limited range of motion with crepitus and tenderness bilaterally.  Tenderness along the right elbow joint line.  CMC joint, PIP, DIP thickening.  Synovial thickening over MCP joints.  Tenderness of the right second and third MCP joints.  Right third and fourth trigger fingers noted.  Subluxation of several DIP joints.  Inability to make complete fist with the right hand.  Knee joints have good range of motion with no warmth or effusion.  Ankle joints have good range of motion with no tenderness or joint swelling.  CDAI Exam: CDAI Score: -- Patient Global: --; Provider Global: -- Swollen: --; Tender: -- Joint Exam 01/27/2023   No joint exam has been documented for this visit   There is currently no information documented on the homunculus. Go to the Rheumatology activity and complete the homunculus joint exam.  Investigation: No additional findings.  Imaging: No results found.  Recent Labs: Lab Results  Component Value Date   WBC 4.7 11/20/2022   HGB 10.8 (L) 11/20/2022   PLT 198 11/20/2022   NA 134 (L) 11/20/2022   K 5.1 11/20/2022   CL 101 11/20/2022   CO2 29 11/20/2022   GLUCOSE 99 11/20/2022   BUN 34 (H) 11/20/2022   CREATININE 1.10 (H) 11/20/2022   BILITOT 0.3 11/20/2022   ALKPHOS 39 11/20/2022   AST 23 11/20/2022   ALT 13 11/20/2022   PROT 7.0 11/20/2022   ALBUMIN 4.2 11/20/2022   CALCIUM 10.0 11/20/2022   GFRAA 60 05/21/2020   QFTBGOLDPLUS Negative 10/01/2022    Speciality Comments: TX  (elevated LFTs) and leflunomide (hair loss) Plaquenil (had macular  changes)  Orencia - 7/21 Prolia - 04/22/19, 11/09/19, 06/06/20,12/20/20  Fu q 3 months  Procedures:  Hand/UE Inj: R long A1 for trigger finger on 01/27/2023 1:56 PM Indications: pain, tendon swelling and therapeutic Details: 27 G needle, ultrasound-guided volar approach Medications: 0.5 mL lidocaine 1 %; 20 mg triamcinolone acetonide 40 MG/ML Aspirate: 0 mL Outcome: tolerated well, no immediate complications Consent was given by the patient. Immediately prior to procedure a time out was called to verify the correct patient, procedure, equipment, support staff and site/side marked as required. Patient was prepped and draped in the usual sterile fashion.    Hand/UE Inj: R ring A1 for trigger finger on 01/27/2023 1:57 PM Indications: pain, tendon swelling and therapeutic Details: 27 G needle, ultrasound-guided volar approach Medications: 0.5 mL lidocaine 1 %; 20 mg triamcinolone acetonide 40 MG/ML Aspirate: 0 mL Outcome: tolerated well, no immediate complications Consent was given by the patient. Immediately prior to procedure a time out was called to verify the correct patient, procedure, equipment, support staff and site/side marked as required. Patient was prepped and draped in the usual sterile fashion.     Allergies: Amlodipine, Prochlorperazine edisylate, Aspirin, Cymbalta [duloxetine hcl], and Pamelor [nortriptyline hcl]  Schedule injection  Devki orders    Assessment / Plan:     Visit Diagnoses: Rheumatoid arthritis of multiple sites with negative rheumatoid factor (HCC) - RF negative, anti-CCP negative:Previous therapy: Methotrexate-elevated LFTs, Arava-hair loss, Plaquenil discontinued due to macular changes: Has been experiencing recurrent flares over the past couple of months during the gap in therapy while holding Orencia. She is currently experiencing a flare which started 3 to 4 days ago involving her right  hand.  She has been taking Tylenol and tramadol as needed for pain relief.  She also has a prescription for Norco to take for moderate to severe pain relief.  She currently has tenderness and inflammation in the right second and third MCP joints.  She has also been experiencing locking in her right third and fourth digits consistent with trigger fingers.  Ultrasound-guided cortisone injections were performed today--she tolerated the procedures well and the procedure notes were completed above.  She has been off of Orencia for several months due to recurrent infections including C. difficile and recurrent UTIs.  She underwent a fecal transplant.  She has been under the care of Dr. Drue Second and Dr. Patsy Lager with close follow-up. She has been cleared by both Dr. Drue Second and Dr. Patsy Lager to resume IV Orencia infusions.  She is scheduled to receive her next infusion on Monday.  She will notify us if her symptoms persist or worsen.  She is aware that she will need to hold Orencia if she develops any signs or symptoms of an infection in the future.  She will follow-up in the office in 3 months or sooner if needed.  High risk medication use -Orencia 500 mg IV infusions every 28 days-reinitiating therapy on Monday.  Do not plan to reload due to risk for immunosuppression. CBC and CMP updated on 11/20/22.  Orders for CBC and CMP were released today.  She will begin having lab work with her infusions in the future. TB gold negative on 10/01/22.  No recent infections. Discussed the importance of holding orencia if she develops signs or symptoms of an infection and to resume once the infection has completely cleared.  She received the annual flu shot.   Sicca syndrome [facility]): Patient continues to have symptoms of chronic dry eyes.  Raynaud's disease without gangrene:  Not currently symptomatic.  No digital ulcerations.  No signs of sclerodactyly noted.  Status post total replacement of right hip - 01/2018 revision by Dr.  Magnus Ivan. X-rays of the right hip were updated on 09/12/2021 by Dr. Magnus Ivan.  Other idiopathic scoliosis, thoracolumbar region - Thoracic kyphosis and thoracolumbar scoliosis.  Age-related osteoporosis without current pathological fracture - DEXA 01/28/2019: The BMD measured at Femur Neck is 0.636 g/cm2 with a T-score of -2.9.  Patient has declined updating her bone density.  Her last Prolia injection was administered on 10/01/22.   Height loss - Thoracic kyphosis noted.  Other medical conditions are listed as follows:  History of recurrent UTIs  Malignant neoplasm of upper lobe of right lung (HCC) - s/p lobectomy 2015.  She is following up closely with Dr. Arbutus Ped.  Anemia of chronic disease  Elevated cholesterol  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HCC)  Essential hypertension - She is followed by cardiologist.  Blood pressure is 125/76 today in the office.  Orders: No orders of the defined types were placed in this encounter.  No orders of the defined types were placed in this encounter.    Follow-Up Instructions: Return in about 3 months (around 04/27/2023) for Rheumatoid arthritis.   Gearldine Bienenstock, PA-C  Note - This record has been created using Dragon software.  Chart creation errors have been sought, but may not always  have been located. Such creation errors do not reflect on  the standard of medical care.

## 2023-01-27 ENCOUNTER — Ambulatory Visit: Payer: Medicare PPO

## 2023-01-27 ENCOUNTER — Encounter: Payer: Self-pay | Admitting: Physician Assistant

## 2023-01-27 ENCOUNTER — Ambulatory Visit: Payer: Medicare PPO | Attending: Physician Assistant | Admitting: Physician Assistant

## 2023-01-27 VITALS — BP 125/76 | HR 80 | Resp 14 | Ht 60.0 in | Wt 107.0 lb

## 2023-01-27 DIAGNOSIS — M65341 Trigger finger, right ring finger: Secondary | ICD-10-CM

## 2023-01-27 DIAGNOSIS — I7300 Raynaud's syndrome without gangrene: Secondary | ICD-10-CM | POA: Diagnosis not present

## 2023-01-27 DIAGNOSIS — D638 Anemia in other chronic diseases classified elsewhere: Secondary | ICD-10-CM

## 2023-01-27 DIAGNOSIS — M0609 Rheumatoid arthritis without rheumatoid factor, multiple sites: Secondary | ICD-10-CM

## 2023-01-27 DIAGNOSIS — I10 Essential (primary) hypertension: Secondary | ICD-10-CM

## 2023-01-27 DIAGNOSIS — Z96641 Presence of right artificial hip joint: Secondary | ICD-10-CM

## 2023-01-27 DIAGNOSIS — Z79899 Other long term (current) drug therapy: Secondary | ICD-10-CM | POA: Diagnosis not present

## 2023-01-27 DIAGNOSIS — M810 Age-related osteoporosis without current pathological fracture: Secondary | ICD-10-CM

## 2023-01-27 DIAGNOSIS — M65331 Trigger finger, right middle finger: Secondary | ICD-10-CM

## 2023-01-27 DIAGNOSIS — M4125 Other idiopathic scoliosis, thoracolumbar region: Secondary | ICD-10-CM

## 2023-01-27 DIAGNOSIS — E270 Other adrenocortical overactivity: Secondary | ICD-10-CM

## 2023-01-27 DIAGNOSIS — Z87440 Personal history of urinary (tract) infections: Secondary | ICD-10-CM

## 2023-01-27 DIAGNOSIS — R29890 Loss of height: Secondary | ICD-10-CM

## 2023-01-27 DIAGNOSIS — M3500 Sicca syndrome, unspecified: Secondary | ICD-10-CM | POA: Diagnosis not present

## 2023-01-27 DIAGNOSIS — C3411 Malignant neoplasm of upper lobe, right bronchus or lung: Secondary | ICD-10-CM

## 2023-01-27 DIAGNOSIS — E7800 Pure hypercholesterolemia, unspecified: Secondary | ICD-10-CM

## 2023-01-27 MED ORDER — LIDOCAINE HCL 1 % IJ SOLN
0.5000 mL | INTRAMUSCULAR | Status: AC | PRN
  Administered 2023-01-27: .5 mL

## 2023-01-27 MED ORDER — TRIAMCINOLONE ACETONIDE 40 MG/ML IJ SUSP
20.0000 mg | INTRAMUSCULAR | Status: AC | PRN
  Administered 2023-01-27: 20 mg

## 2023-01-28 ENCOUNTER — Encounter: Payer: Self-pay | Admitting: Internal Medicine

## 2023-01-28 LAB — CBC WITH DIFFERENTIAL/PLATELET
Absolute Lymphocytes: 1882 {cells}/uL (ref 850–3900)
Absolute Monocytes: 557 {cells}/uL (ref 200–950)
Basophils Absolute: 32 {cells}/uL (ref 0–200)
Basophils Relative: 0.5 %
Eosinophils Absolute: 122 {cells}/uL (ref 15–500)
Eosinophils Relative: 1.9 %
HCT: 35.4 % (ref 35.0–45.0)
Hemoglobin: 11.5 g/dL — ABNORMAL LOW (ref 11.7–15.5)
MCH: 29.1 pg (ref 27.0–33.0)
MCHC: 32.5 g/dL (ref 32.0–36.0)
MCV: 89.6 fL (ref 80.0–100.0)
MPV: 10.5 fL (ref 7.5–12.5)
Monocytes Relative: 8.7 %
Neutro Abs: 3808 {cells}/uL (ref 1500–7800)
Neutrophils Relative %: 59.5 %
Platelets: 212 10*3/uL (ref 140–400)
RBC: 3.95 10*6/uL (ref 3.80–5.10)
RDW: 12.8 % (ref 11.0–15.0)
Total Lymphocyte: 29.4 %
WBC: 6.4 10*3/uL (ref 3.8–10.8)

## 2023-01-28 LAB — COMPLETE METABOLIC PANEL WITH GFR
AG Ratio: 1.7 (calc) (ref 1.0–2.5)
ALT: 13 U/L (ref 6–29)
AST: 26 U/L (ref 10–35)
Albumin: 4.4 g/dL (ref 3.6–5.1)
Alkaline phosphatase (APISO): 37 U/L (ref 37–153)
BUN/Creatinine Ratio: 25 (calc) — ABNORMAL HIGH (ref 6–22)
BUN: 29 mg/dL — ABNORMAL HIGH (ref 7–25)
CO2: 29 mmol/L (ref 20–32)
Calcium: 9.3 mg/dL (ref 8.6–10.4)
Chloride: 101 mmol/L (ref 98–110)
Creat: 1.16 mg/dL — ABNORMAL HIGH (ref 0.60–0.95)
Globulin: 2.6 g/dL (ref 1.9–3.7)
Glucose, Bld: 93 mg/dL (ref 65–99)
Potassium: 4.8 mmol/L (ref 3.5–5.3)
Sodium: 137 mmol/L (ref 135–146)
Total Bilirubin: 0.3 mg/dL (ref 0.2–1.2)
Total Protein: 7 g/dL (ref 6.1–8.1)
eGFR: 45 mL/min/{1.73_m2} — ABNORMAL LOW (ref >=60)

## 2023-01-28 NOTE — Progress Notes (Signed)
Hemoglobin remains low but has improved. Rest of CBC WNL.   Creatinine remains elevated but stable-1.16 and GFR is low 45.  Avoid the use of NSAIDs.  Calcium is WNL. Rest of CMP WNL

## 2023-01-30 ENCOUNTER — Other Ambulatory Visit (HOSPITAL_COMMUNITY): Payer: Self-pay

## 2023-01-30 ENCOUNTER — Other Ambulatory Visit: Payer: Self-pay | Admitting: Pharmacist

## 2023-01-30 DIAGNOSIS — Z79899 Other long term (current) drug therapy: Secondary | ICD-10-CM

## 2023-01-30 DIAGNOSIS — M0609 Rheumatoid arthritis without rheumatoid factor, multiple sites: Secondary | ICD-10-CM

## 2023-01-30 NOTE — Progress Notes (Signed)
Next infusion scheduled for Orencia IV ([id number]) on 02/02/23 and due for updated orders. She has been off of treatment since August 2024 but we will not be re-loading since she has had recurrent C diff episodes Diagnosis: RA  Dose: 500mg  every 28 days (this dose remains appropriate based on most recent weight of 48.5kg)  Last Clinic Visit: 01/27/2023 Next Clinic Visit: not yet scheduled  Last infusion: 10/01/2022  Labs: CBC and CMP on 01/27/2023 TB Gold: negative on 10/01/22   Orders placed for Orencia IV ([id number]) x 3 doses along with premedication of acetaminophen and diphenhydramine to be administered 30 minutes before medication infusion.  Standing CBC with diff/platelet and CMP with GFR orders placed to be drawn every 2 months.  Next TB gold due 10/01/2023  Chesley Mires, PharmD, MPH, BCPS, CPP Clinical Pharmacist (Rheumatology and Pulmonology)

## 2023-02-02 ENCOUNTER — Ambulatory Visit (HOSPITAL_COMMUNITY)
Admission: RE | Admit: 2023-02-02 | Discharge: 2023-02-02 | Disposition: A | Discharge status: Home / Self Care | Payer: Medicare PPO | Source: Ambulatory Visit | Attending: Rheumatology | Admitting: Rheumatology

## 2023-02-02 DIAGNOSIS — M0609 Rheumatoid arthritis without rheumatoid factor, multiple sites: Secondary | ICD-10-CM | POA: Diagnosis not present

## 2023-02-02 DIAGNOSIS — Z79899 Other long term (current) drug therapy: Secondary | ICD-10-CM | POA: Diagnosis not present

## 2023-02-02 MED ORDER — DIPHENHYDRAMINE HCL 25 MG PO CAPS: 25.0000 mg | ORAL_CAPSULE | ORAL | Status: DC

## 2023-02-02 MED ORDER — SODIUM CHLORIDE 0.9 % IV SOLN
500.0000 mg | INTRAVENOUS | Status: DC
  Administered 2023-02-02: 500 mg via INTRAVENOUS
  Filled 2023-02-02: qty 20

## 2023-02-02 MED ORDER — ACETAMINOPHEN 325 MG PO TABS: 650.0000 mg | ORAL_TABLET | ORAL | Status: DC

## 2023-02-13 ENCOUNTER — Telehealth: Payer: Self-pay | Admitting: Pharmacist

## 2023-02-13 NOTE — Telephone Encounter (Signed)
Patient has [employer]. Orencia requires precertification renewal. Submitted via CMM  Key: [id number]   Medication: IV Orencia ([id number]) Phone: [phone]   Chesley Mires, PharmD, MPH, BCPS, CPP Clinical Pharmacist (Rheumatology and Pulmonology)

## 2023-02-16 ENCOUNTER — Other Ambulatory Visit: Payer: Self-pay | Admitting: Family Medicine

## 2023-02-16 ENCOUNTER — Other Ambulatory Visit: Payer: Self-pay | Admitting: Internal Medicine

## 2023-02-16 DIAGNOSIS — N390 Urinary tract infection, site not specified: Secondary | ICD-10-CM

## 2023-02-17 NOTE — Telephone Encounter (Signed)
 Received notification from [employer] regarding a prior authorization for ORENCIA  IV. Authorization has been APPROVED from 02/18/2023 to 02/17/2024. Approval letter sent to scan center.  Authorization # [id number]  [name], PharmD, MPH, BCPS, CPP Clinical Pharmacist (Rheumatology and Pulmonology)

## 2023-02-26 ENCOUNTER — Other Ambulatory Visit: Payer: Self-pay

## 2023-02-27 ENCOUNTER — Other Ambulatory Visit: Payer: Medicare PPO

## 2023-03-02 ENCOUNTER — Inpatient Hospital Stay (HOSPITAL_COMMUNITY): Admission: RE | Admit: 2023-03-02 | Payer: Medicare PPO | Source: Ambulatory Visit

## 2023-03-04 ENCOUNTER — Other Ambulatory Visit: Payer: Self-pay

## 2023-03-05 ENCOUNTER — Encounter: Payer: Self-pay | Admitting: Family Medicine

## 2023-03-05 ENCOUNTER — Ambulatory Visit: Payer: Medicare PPO | Admitting: Physician Assistant

## 2023-03-05 ENCOUNTER — Ambulatory Visit (HOSPITAL_COMMUNITY)
Admission: RE | Admit: 2023-03-05 | Discharge: 2023-03-05 | Disposition: A | Discharge status: Home / Self Care | Payer: Medicare PPO | Source: Ambulatory Visit | Attending: Rheumatology | Admitting: Rheumatology

## 2023-03-05 DIAGNOSIS — Z79899 Other long term (current) drug therapy: Secondary | ICD-10-CM

## 2023-03-05 DIAGNOSIS — M0609 Rheumatoid arthritis without rheumatoid factor, multiple sites: Secondary | ICD-10-CM

## 2023-03-05 MED ORDER — HYDROCODONE-ACETAMINOPHEN 5-325 MG PO TABS: 1.0000 | ORAL_TABLET | Freq: Two times a day (BID) | ORAL | 0 refills | Status: DC | PRN

## 2023-03-05 MED ORDER — ABATACEPT 250 MG IV SOLR
500.0000 mg | INTRAVENOUS | Status: DC
  Administered 2023-03-05: 500 mg via INTRAVENOUS
  Filled 2023-03-05: qty 20

## 2023-03-05 MED ORDER — DIPHENHYDRAMINE HCL 25 MG PO CAPS
25.0000 mg | ORAL_CAPSULE | ORAL | Status: DC
Start: 2023-03-05 — End: 2023-03-06

## 2023-03-05 MED ORDER — ACETAMINOPHEN 325 MG PO TABS: 650.0000 mg | ORAL_TABLET | ORAL | Status: DC

## 2023-03-07 ENCOUNTER — Other Ambulatory Visit: Payer: Self-pay | Admitting: Physician Assistant

## 2023-03-12 ENCOUNTER — Telehealth: Payer: Self-pay

## 2023-03-12 NOTE — Telephone Encounter (Signed)
Patient called and states she received an Orencia infusion on 03/05/2023 and her entire arm is "black and blue" from her wrist to her elbow and it will stay like this for approximately 3 weeks or so. Patient states this happened with the last infusion as well. Patient would like to know if she could potentially have a port placed since she has to go so frequently for the infusions. Please advise.

## 2023-03-12 NOTE — Telephone Encounter (Signed)
Patient has history of recurrent infection.  The risk of having infection from a port is much higher which can result in sepsis.  Bruising is expected after and IV access.  She may apply ice pack after the infusion to reduce bruising.

## 2023-03-12 NOTE — Telephone Encounter (Signed)
Patient advised Patient has history of recurrent infection. The risk of having infection from a port is much higher which can result in sepsis. Bruising is expected after and IV access. She may apply ice pack after the infusion to reduce bruising. Patient verbalized understanding.

## 2023-03-30 ENCOUNTER — Encounter (HOSPITAL_COMMUNITY): Payer: Medicare PPO

## 2023-03-31 ENCOUNTER — Other Ambulatory Visit: Payer: Self-pay

## 2023-04-02 ENCOUNTER — Other Ambulatory Visit: Payer: Self-pay | Admitting: Pharmacist

## 2023-04-02 ENCOUNTER — Ambulatory Visit (HOSPITAL_COMMUNITY)
Admission: RE | Admit: 2023-04-02 | Discharge: 2023-04-02 | Disposition: A | Discharge status: Home / Self Care | Payer: Medicare PPO | Source: Ambulatory Visit | Attending: Rheumatology | Admitting: Rheumatology

## 2023-04-02 DIAGNOSIS — M0609 Rheumatoid arthritis without rheumatoid factor, multiple sites: Secondary | ICD-10-CM

## 2023-04-02 DIAGNOSIS — Z13220 Encounter for screening for lipoid disorders: Secondary | ICD-10-CM

## 2023-04-02 DIAGNOSIS — Z79899 Other long term (current) drug therapy: Secondary | ICD-10-CM | POA: Diagnosis not present

## 2023-04-02 DIAGNOSIS — E7800 Pure hypercholesterolemia, unspecified: Secondary | ICD-10-CM

## 2023-04-02 DIAGNOSIS — Z5181 Encounter for therapeutic drug level monitoring: Secondary | ICD-10-CM

## 2023-04-02 DIAGNOSIS — M069 Rheumatoid arthritis, unspecified: Secondary | ICD-10-CM

## 2023-04-02 LAB — COMPREHENSIVE METABOLIC PANEL WITH GFR
ALT: 16 U/L (ref 0–44)
AST: 34 U/L (ref 15–41)
Albumin: 3.8 g/dL (ref 3.5–5.0)
Alkaline Phosphatase: 27 U/L — ABNORMAL LOW (ref 38–126)
Anion gap: 8 (ref 5–15)
BUN: 31 mg/dL — ABNORMAL HIGH (ref 8–23)
CO2: 26 mmol/L (ref 22–32)
Calcium: 9.5 mg/dL (ref 8.9–10.3)
Chloride: 98 mmol/L (ref 98–111)
Creatinine, Ser: 1.09 mg/dL — ABNORMAL HIGH (ref 0.44–1.00)
GFR, Estimated: 48 mL/min — ABNORMAL LOW
Glucose, Bld: 90 mg/dL (ref 70–99)
Potassium: 5.1 mmol/L (ref 3.5–5.1)
Sodium: 132 mmol/L — ABNORMAL LOW (ref 135–145)
Total Bilirubin: 0.7 mg/dL (ref 0.0–1.2)
Total Protein: 6.5 g/dL (ref 6.5–8.1)

## 2023-04-02 LAB — CBC WITH DIFFERENTIAL/PLATELET
Abs Immature Granulocytes: 0.01 10*3/uL (ref 0.00–0.07)
Basophils Absolute: 0 10*3/uL (ref 0.0–0.1)
Basophils Relative: 1 %
Eosinophils Absolute: 0.1 10*3/uL (ref 0.0–0.5)
Eosinophils Relative: 2 %
HCT: 35.1 % — ABNORMAL LOW (ref 36.0–46.0)
Hemoglobin: 11.3 g/dL — ABNORMAL LOW (ref 12.0–15.0)
Immature Granulocytes: 0 %
Lymphocytes Relative: 28 %
Lymphs Abs: 1.3 10*3/uL (ref 0.7–4.0)
MCH: 29.9 pg (ref 26.0–34.0)
MCHC: 32.2 g/dL (ref 30.0–36.0)
MCV: 92.9 fL (ref 80.0–100.0)
Monocytes Absolute: 0.5 10*3/uL (ref 0.1–1.0)
Monocytes Relative: 10 %
Neutro Abs: 2.8 10*3/uL (ref 1.7–7.7)
Neutrophils Relative %: 59 %
Platelets: 197 10*3/uL (ref 150–400)
RBC: 3.78 MIL/uL — ABNORMAL LOW (ref 3.87–5.11)
RDW: 13.5 % (ref 11.5–15.5)
WBC: 4.8 10*3/uL (ref 4.0–10.5)
nRBC: 0 % (ref 0.0–0.2)

## 2023-04-02 MED ORDER — SODIUM CHLORIDE 0.9 % IV SOLN
500.0000 mg | INTRAVENOUS | Status: DC
  Administered 2023-04-02: 500 mg via INTRAVENOUS
  Filled 2023-04-02: qty 20

## 2023-04-02 MED ORDER — DIPHENHYDRAMINE HCL 25 MG PO CAPS: 25.0000 mg | ORAL_CAPSULE | ORAL | Status: DC

## 2023-04-02 MED ORDER — ACETAMINOPHEN 325 MG PO TABS: 650.0000 mg | ORAL_TABLET | ORAL | Status: DC

## 2023-04-02 NOTE — Progress Notes (Signed)
Next infusion scheduled for Orencia IV ([id number]) on 04/29/2023 and due for updated orders. Diagnosis: RA  Dose: 500mg  IV every 28 days (appropriate based on last recorded weight of 46.7kg)  Last Clinic Visit: 01/27/2023 Next Clinic Visit: not scheduled  Last infusion: 01/27/2023  Labs: CBC and CMP on 04/02/2023 TB Gold: negative on 10/01/2022   Orders placed for Orencia IV ([id number]) x 3 doses along with premedication of acetaminophen and diphenhydramine to be administered 30 minutes before medication infusion.  Standing CBC with diff/platelet and CMP with GFR orders placed to be drawn every 2 months.  Next TB gold due 10/01/2023. Order for lipid panel placed  Chesley Mires, PharmD, MPH, BCPS, CPP Clinical Pharmacist (Rheumatology and Pulmonology)

## 2023-04-07 ENCOUNTER — Encounter: Payer: Self-pay | Admitting: Family Medicine

## 2023-04-10 NOTE — Progress Notes (Signed)
 [facility] [address], [address] [place], [place] [address] [phone]  Date:  04/13/2023   Name:  [name]   DOB:  09/24/1930   MRN:  [id number]  PCP:  Pearline Cables, MD    Chief Complaint: Follow-up (Pt states ear is "still hurting" /Pt states head feels heavy /Follow up from ER visit on 04/11/2023)   History of Present Illness:  [name] is a 89 y.o. 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   She sent me the following message on 2/18\ For several weeks, I have been using eardrops in my left ear With cotton in it. But it's still doesn't seem to be doing any good! I think that I need to be referred to a EENT specialist to let them see what's going on! I know it's not a good time with the weather predictions, but I thought if I could get on someone's calendar that maybe I could see them next week!!   She feels like her head is in a bucket- her ears are congested but not really painful  She was in the ER over the weekend with elevated BP- she went to the ER and was evaluated and released to home   Her ears are not hurting per se- maybe a dull ache She has been using some debrox in her ears for the last several days in hopes of making it easier to remove any wax in her ears  Patient Active Problem List   Diagnosis Date Noted   Diarrhea of presumed infectious origin 08/22/2022   Colitis 08/18/2022   Acute congestive heart failure (HCC) 12/29/2021   Acute respiratory failure (HCC) 12/24/2021   Acute respiratory  failure with hypoxia (HCC) 12/23/2021   Acute on chronic combined systolic and diastolic CHF (congestive heart failure) (HCC) 12/23/2021   Rectal pain 10/10/2020   Acute posthemorrhagic anemia    Rectal bleeding 06/17/2019   Grade II internal hemorrhoids    Rectal ulcer    Popliteal artery occlusion, 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/10/2018   HTN (hypertension) 04/10/2018   Femoral artery pseudo-aneurysm, 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/09/2018   Anemia of chronic disease 02/24/2018   Unstable right hip arthroplasty 01/24/2018   History of revision of total replacement of right hip joint 01/24/2018   Hypovolemic shock (HCC)    Hyperkalemia    Hyponatremia    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   Status post revision of total hip 11/27/2017   Elevated cholesterol 10/11/2015   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HCC) 10/04/2014   Bilateral leg edema 08/01/2014   Elevated BP 08/01/2014   Rheumatoid arthritis involving multiple joints (HCC) 05/30/2014   Status post total replacement of right hip 04/28/2014   Long-term use of high-risk medication 11/11/2013   Symptomatic anemia 11/11/2013   Neuropathic pain 07/07/2013   Constipation due to pain medication 07/07/2013   Protein-calorie malnutrition, severe (HCC) 06/08/2013   Lung cancer, Right upper lobe 05/08/2013   Sciatica of right side 08/13/2011   Osteoporosis 03/07/2010   PARESTHESIA 03/07/2010   Low back pain 03/16/2009   CT, CHEST, ABNORMAL 12/18/2008  ABNORMAL ECHOCARDIOGRAM 12/14/2008   SJOGREN'S SYNDROME 11/29/2008   HYPOGLYCEMIA 06/29/2006   RAYNAUD'S DISEASE 06/29/2006    Past Medical History:  Diagnosis Date   Arthralgia of multiple joints    followed by dr Sharmon Revere   Arthritis    C. difficile colitis 08/2022   Cardiomyopathy (HCC)    Chronic constipation    Chronic inflammatory arthritis    rhemotolgist-  dr a. Sharmon Revere ([facility])   Dry eyes    eye drops used    GERD (gastroesophageal reflux disease)    H/O discoid lupus  erythematosus    Hiatal hernia    History of colon polyps    Hypothyroidism    Iron deficiency anemia    LBBB (left bundle branch block) 2010   Mitchell's disease (erythromelalgia) [facility])    neurologist-  dr patel   Nocturia    Non-small cell cancer of right lung [facility]) surgeon-- dr gerhardt/  oncologist-  dr Arbutus Ped--- per lov notes no recurrence/   11-18-2017 per pt denies any symptoms   dx 2015--  Stage IIA (T2b,N0,M0) , +EGFR  mutation in exon 21, non-small cell adenocarcinoma right upper lobe---  s/p  Right upper lobectomy , right middley wedge resection and node dissection---  no chemo or radiation therapy   OA (osteoarthritis)    hands   Osteoporosis    PONV (postoperative nausea and vomiting)    likes phenergan   Raynaud's phenomenon 1965   Renal insufficiency    Rheumatoid arthritis (HCC)    Sciatica    Scoliosis    Sjogren's syndrome [facility])     Past Surgical History:  Procedure Laterality Date   ANTERIOR HIP REVISION Right 11/27/2017   Procedure: RIGHT HIP ACETABULAR REVISION;  Surgeon: Kathryne Hitch, MD;  Location: [facility] ORS;  Service: Orthopedics;  Laterality: Right;   ANTERIOR HIP REVISION Right 01/24/2018   Procedure: OPEN REDUCTION OF DISLOCATED ANTERIOR HIP WITH REVISION OF LINER AND HIP BALL;  Surgeon: Kathryne Hitch, MD;  Location: [facility]S;  Service: Orthopedics;  Laterality: Right;   APPENDECTOMY  1950s   BIOPSY  04/14/2018   Procedure: BIOPSY;  Surgeon: Benancio Deeds, MD;  Location: [facility] ENDOSCOPY;  Service: Gastroenterology;;   BIOPSY  04/16/2018   Procedure: BIOPSY;  Surgeon: Lemar Lofty., MD;  Location: [facility] ENDOSCOPY;  Service: Gastroenterology;;   CARDIOVASCULAR STRESS TEST  12/2008    mild fixed basal to mid septal perfusion defect felt likely due to artifact from LBBB, no ischemia, EF 58%   COLONOSCOPY     COLONOSCOPY WITH PROPOFOL N/A 04/16/2018   Procedure: COLONOSCOPY WITH PROPOFOL;  Surgeon: Lemar Lofty., MD;   Location: [facility] ENDOSCOPY;  Service: Gastroenterology;  Laterality: N/A;   ESOPHAGOGASTRODUODENOSCOPY (EGD) WITH PROPOFOL N/A 04/14/2018   Procedure: ESOPHAGOGASTRODUODENOSCOPY (EGD) WITH PROPOFOL;  Surgeon: Benancio Deeds, MD;  Location: [facility] END[facility]COPY;  Service: Gastroenterology;  Laterality: N/A;   FEMORAL-POPLITEAL BYPASS GRAFT Right 04/10/2018   Procedure: REPAIR RIGHT FEMORAL ARTERY PSEUDOANEURYSM, RETROPERITONEAL EXPOSURE OF ILIAC ARTERY, RIGHT POPLITEAL EMBOLECTOMY;  Surgeon: Chuck Hint, MD;  Location: [facility] OR;  Service: Vascular;  Laterality: Right;   FLEXIBLE SIGMOIDOSCOPY N/A 06/17/2019   Procedure: FLEXIBLE SIGMOIDOSCOPY;  Surgeon: Shellia Cleverly, DO;  Location: [facility] ENDOSCOPY;  Service: Gastroenterology;  Laterality: N/A;   HEMOSTASIS CLIP PLACEMENT  06/17/2019   Procedure: HEMOSTASIS CLIP PLACEMENT;  Surgeon: Shellia Cleverly, DO;  Location: [facility] ENDOSCOPY;  Service: Gastroenterology;;   LYMPH NODE DISSECTION Right 06/07/2013   Procedure: LYMPH NODE  DISSECTION;  Surgeon: Delight Ovens, MD;  Location: [facility] OR;  Service: Thoracic;  Laterality: Right;   PATCH ANGIOPLASTY Right 04/10/2018   Procedure: PATCH  ANGIOPLASTY OF RIGHT FEMORAL ARTERY USING BOVINE PATCH, PATCH ANGIOPLASTY OF RIGHT POPLITEAL ARTERY USING BOVINE PATCH;  Surgeon: Chuck Hint, MD;  Location: [facility] OR;  Service: Vascular;  Laterality: Right;   SCHLEROTHERAPY  06/17/2019   Procedure: Theresia Majors OF VARICES;  Surgeon: Shellia Cleverly, DO;  Location: [facility] ENDOSCOPY;  Service: Gastroenterology;;   THORACIC SYMPATHETECTOMY  1965   "large incision from chest to up to shoulder, the nerves were tied together, for raynaud's   THORACOTOMY  06/07/2013   Procedure: MINI/LIMITED THORACOTOMY; right middle lobe wedge resection;  Surgeon: Delight Ovens, MD;  Location: [facility] OR;  Service: Thoracic;;   TONSILLECTOMY  child   TOTAL ABDOMINAL HYSTERECTOMY  1980's    W/ BSO   TOTAL HIP ARTHROPLASTY Right 04/28/2014    Procedure: RIGHT TOTAL HIP ARTHROPLASTY ANTERIOR APPROACH;  Surgeon: Kathryne Hitch, MD;  Location: [facility]S;  Service: Orthopedics;  Laterality: Right;   TRANSTHORACIC ECHOCARDIOGRAM  12/11/2008   ef 45-50%, grade 1 diastolic dysfunction/  mild LAE/  mild AR and MR/  trivial TR   VIDEO ASSISTED THORACOSCOPY (VATS)/WEDGE RESECTION Right 06/07/2013   Procedure: VIDEO ASSISTED THORACOSCOPY (VATS)/right upper lobectomy, On Q;  Surgeon: Delight Ovens, MD;  Location: [facility] OR;  Service: Thoracic;  Laterality: Right;   VIDEO BRONCHOSCOPY N/A 06/07/2013   Procedure: VIDEO BRONCHOSCOPY;  Surgeon: Delight Ovens, MD;  Location: [facility] OR;  Service: Thoracic;  Laterality: N/A;   VIDEO BRONCHOSCOPY WITH ENDOBRONCHIAL NAVIGATION N/A 05/04/2013   Procedure: VIDEO BRONCHOSCOPY WITH ENDOBRONCHIAL NAVIGATION;  Surgeon: Delight Ovens, MD;  Location: [facility] OR;  Service: Thoracic;  Laterality: N/A;    Social History   Tobacco Use   Smoking status: Never    Passive exposure: Past   Smokeless tobacco: Never  Vaping Use   Vaping status: Never Used  Substance Use Topics   Alcohol use: Not Currently   Drug use: Never    Family History  Problem Relation Age of Onset   Other Mother 21       MVA   Coronary artery disease Father    Colon cancer Father    Diabetes Father    Cancer Father        colon   Healthy Sister    Pneumonia Sister    Healthy Brother    Other Brother        killed in war   Healthy Daughter    Hypothyroidism Daughter    Healthy Daughter    Esophageal cancer Neg Hx    Kidney disease Neg Hx    Liver disease Neg Hx     Allergies  Allergen Reactions   Amlodipine Rash   Prochlorperazine Edisylate Anaphylaxis    Compazine--- tongue swells and rash   Aspirin Other (See Comments)    Nose bleeds. Cannot take NSAIDS    Cymbalta [Duloxetine Hcl] Diarrhea, Nausea And Vomiting and Other (See Comments)    Increased blood pressure   Pamelor [Nortriptyline Hcl] Diarrhea and  Nausea Only    Increased Heart rate and BP    Medication list has been reviewed and updated.  Current Outpatient Medications on File Prior to Visit  Medication Sig Dispense Refill   acetaminophen (TYLENOL) 500 MG tablet Take 1 tablet (500 mg total) by mouth every 6 (six) hours as needed (pain). 30 tablet 0  Biotin 1000 MCG tablet Take 1,000 mcg by mouth 2 (two) times daily.     carvedilol (COREG) 6.25 MG tablet Take 1 tablet (6.25 mg total) by mouth 2 (two) times daily with a meal. 180 tablet 3   estradiol (ESTRACE) 0.1 MG/GM vaginal cream Place one gram vaginally up to three times a week as needed to maintain comfort 42.5 g 5   famotidine (PEPCID) 20 MG tablet Take 1 tablet (20 mg total) by mouth daily. 90 tablet 3   gabapentin (NEURONTIN) 300 MG capsule Take 1 capsule (300 mg total) by mouth 3 (three) times daily. 360 capsule 1   HYDROcodone-acetaminophen (NORCO/VICODIN) 5-325 MG tablet Take 1 tablet by mouth 2 (two) times daily as needed for moderate pain (pain score 4-6). 30 tablet 0   levothyroxine (SYNTHROID) 75 MCG tablet TAKE 1 TABLET BY MOUTH EVERY DAY BEFORE BREAKFAST 90 tablet 1   Multiple Vitamin (MULTIVITAMIN) tablet Take 1 tablet by mouth daily.     ondansetron (ZOFRAN) 4 MG tablet Take 1 tablet (4 mg total) by mouth every 8 (eight) hours as needed for nausea or vomiting. 30 tablet 1   Probiotic Product (PROBIOTIC PO) Take 1 capsule by mouth daily.      traMADol (ULTRAM) 50 MG tablet TAKE 1/2-1 TABLET BY MOUTH EVERY 6-8 HOURS AS NEEDED. DO NOT COMBINE WITH HYDROCODONE 120 tablet 2   No current facility-administered medications on file prior to visit.    Review of Systems:  As per HPI- otherwise negative.   Physical Examination: Vitals:   04/13/23 1458  BP: 134/72  Pulse: 86  SpO2: 97%   Vitals:   04/13/23 1458  Weight: 110 lb 6.4 oz (50.1 kg)  Height: 5' (1.524 m)   Body mass index is 21.56 kg/m. Ideal Body Weight: Weight in (lb) to have BMI = 25:  127.7  GEN: no acute distress. Petite build, looks well and her normal self  HEENT: Atraumatic, Normocephalic.  Ears and Nose: No external deformity. CV: RRR, No M/G/R. No JVD. No thrill. No extra heart sounds. PULM: CTA B, no wheezes, crackles, rhonchi. No retractions. No resp. distress. No accessory muscle use. ABD: S, NT, ND, +BS. No rebound. No HSM. EXTR: No c/c/e PSYCH: Normally interactive. Conversant.  Cerumen in left ear canal Dead skin occluding right ear canal VC obtained- warm water irrigation removed wax in left ear canal, pt fel better right away Irrigation not working in right ear. Used curette to move dead skin membrane that was covering ear canal We were successful in opening the passage to her tympanic membrane but the ear canal was traumatized and bled slightly.  Patient is advised that this happened.  I am able to observe her tympanic membrane which appears normal and intact.  Patient noted that her hearing in the right ear seems improved and normal.  She is advised to let me know if there is any bleeding beyond a couple of drops or she develops any hearing loss or pain  Patient tolerated procedure well, estimated blood loss less than 1 mL  Assessment and Plan: Bilateral impacted cerumen As above, successfully irrigated left ear and removed earwax.  Removed occluding skin membrane in right ear canal with mild trauma to the ear canal.  Patient will let me know if she has any concerns  Signed Abbe Amsterdam, MD

## 2023-04-11 ENCOUNTER — Emergency Department (HOSPITAL_BASED_OUTPATIENT_CLINIC_OR_DEPARTMENT_OTHER): Payer: Medicare PPO

## 2023-04-11 ENCOUNTER — Emergency Department (HOSPITAL_BASED_OUTPATIENT_CLINIC_OR_DEPARTMENT_OTHER)
Admission: EM | Admit: 2023-04-11 | Discharge: 2023-04-12 | Disposition: A | Discharge status: Home / Self Care | Payer: Medicare PPO | Attending: Emergency Medicine | Admitting: Emergency Medicine

## 2023-04-11 ENCOUNTER — Other Ambulatory Visit: Payer: Self-pay

## 2023-04-11 ENCOUNTER — Encounter (HOSPITAL_BASED_OUTPATIENT_CLINIC_OR_DEPARTMENT_OTHER): Payer: Self-pay | Admitting: *Deleted

## 2023-04-11 DIAGNOSIS — R110 Nausea: Secondary | ICD-10-CM | POA: Diagnosis not present

## 2023-04-11 DIAGNOSIS — R079 Chest pain, unspecified: Secondary | ICD-10-CM | POA: Diagnosis not present

## 2023-04-11 DIAGNOSIS — I160 Hypertensive urgency: Secondary | ICD-10-CM

## 2023-04-11 DIAGNOSIS — I509 Heart failure, unspecified: Secondary | ICD-10-CM | POA: Diagnosis not present

## 2023-04-11 DIAGNOSIS — I110 Hypertensive heart disease with heart failure: Secondary | ICD-10-CM | POA: Insufficient documentation

## 2023-04-11 DIAGNOSIS — I10 Essential (primary) hypertension: Secondary | ICD-10-CM | POA: Diagnosis not present

## 2023-04-11 DIAGNOSIS — R918 Other nonspecific abnormal finding of lung field: Secondary | ICD-10-CM | POA: Diagnosis not present

## 2023-04-11 DIAGNOSIS — Z79899 Other long term (current) drug therapy: Secondary | ICD-10-CM | POA: Diagnosis not present

## 2023-04-11 NOTE — ED Notes (Signed)
 Patient transported to X-ray

## 2023-04-11 NOTE — ED Triage Notes (Signed)
 Pt has had elevated BP and some feeling of nausea, hot flashes and not feeling well including intermittent CP and sob.  Pt called ems and they told her that she should come to ED

## 2023-04-12 DIAGNOSIS — I10 Essential (primary) hypertension: Secondary | ICD-10-CM | POA: Diagnosis not present

## 2023-04-12 DIAGNOSIS — R079 Chest pain, unspecified: Secondary | ICD-10-CM | POA: Diagnosis not present

## 2023-04-12 DIAGNOSIS — R110 Nausea: Secondary | ICD-10-CM | POA: Diagnosis not present

## 2023-04-12 DIAGNOSIS — R918 Other nonspecific abnormal finding of lung field: Secondary | ICD-10-CM | POA: Diagnosis not present

## 2023-04-12 LAB — CBC
HCT: 37.1 % (ref 36.0–46.0)
Hemoglobin: 11.8 g/dL — ABNORMAL LOW (ref 12.0–15.0)
MCH: 29.4 pg (ref 26.0–34.0)
MCHC: 31.8 g/dL (ref 30.0–36.0)
MCV: 92.3 fL (ref 80.0–100.0)
Platelets: 199 10*3/uL (ref 150–400)
RBC: 4.02 MIL/uL (ref 3.87–5.11)
RDW: 13.2 % (ref 11.5–15.5)
WBC: 4.8 10*3/uL (ref 4.0–10.5)
nRBC: 0 % (ref 0.0–0.2)

## 2023-04-12 LAB — BASIC METABOLIC PANEL
Anion gap: 10 (ref 5–15)
BUN: 24 mg/dL — ABNORMAL HIGH (ref 8–23)
CO2: 25 mmol/L (ref 22–32)
Calcium: 9 mg/dL (ref 8.9–10.3)
Chloride: 96 mmol/L — ABNORMAL LOW (ref 98–111)
Creatinine, Ser: 1.06 mg/dL — ABNORMAL HIGH (ref 0.44–1.00)
GFR, Estimated: 50 mL/min — ABNORMAL LOW (ref >60)
Glucose, Bld: 105 mg/dL — ABNORMAL HIGH (ref 70–99)
Potassium: 4.3 mmol/L (ref 3.5–5.1)
Sodium: 131 mmol/L — ABNORMAL LOW (ref 135–145)

## 2023-04-12 LAB — TROPONIN I (HIGH SENSITIVITY): Troponin I (High Sensitivity): 14 ng/L (ref <18)

## 2023-04-12 MED ORDER — SODIUM CHLORIDE 0.9 % IV BOLUS
500.0000 mL | Freq: Once | INTRAVENOUS | Status: AC
  Administered 2023-04-12: 500 mL via INTRAVENOUS

## 2023-04-12 NOTE — Discharge Instructions (Signed)
 Continue medications as previously prescribed.  Return to the emergency department if you develop any new and/or concerning issues.

## 2023-04-12 NOTE — ED Provider Notes (Signed)
 [place] [facility] AT [facility] Provider Note   CSN: [id number] Arrival date & time: 04/11/23  2333     History  Chief Complaint  Patient presents with   Hypertension    [name] is a 89 y.o. female.  Patient is a 89 year old female with past medical history of rheumatoid arthritis, hypertension, CHF.  Patient presenting today for evaluation of elevated blood pressure and what she describes as a "hot flash".  She has had multiple episodes over the years of the sensation of hot water being poured onto her along with elevated blood pressure and feeling generally unwell.  No cause for this has been found in the past.  She has not had 2 episodes of this this week and presents for evaluation.  EMS initially came to the house and she was told she was in A-fib, but arrives here in a sinus rhythm.  The history is provided by the patient.       Home Medications Prior to Admission medications   Medication Sig Start Date End Date Taking? Authorizing Provider  acetaminophen (TYLENOL) 500 MG tablet Take 1 tablet (500 mg total) by mouth every 6 (six) hours as needed (pain). 08/23/22   Rhetta Mura, MD  Biotin 1000 MCG tablet Take 1,000 mcg by mouth 2 (two) times daily.    [provider]  carvedilol (COREG) 6.25 MG tablet Take 1 tablet (6.25 mg total) by mouth 2 (two) times daily with a meal. 01/07/23   Jens Som, Madolyn Frieze, MD  estradiol (ESTRACE) 0.1 MG/GM vaginal cream Place one gram vaginally up to three times a week as needed to maintain comfort 02/16/23   Copland, [name], MD  famotidine (PEPCID) 20 MG tablet Take 1 tablet (20 mg total) by mouth daily. 09/10/22   Copland, [name], MD  gabapentin (NEURONTIN) 300 MG capsule Take 1 capsule (300 mg total) by mouth 3 (three) times daily. 01/06/23   Copland, [name], MD  HYDROcodone-acetaminophen (NORCO/VICODIN) 5-325 MG tablet Take 1 tablet by mouth 2 (two) times daily as needed for moderate pain  (pain score 4-6). 03/05/23   Copland, [name], MD  levothyroxine (SYNTHROID) 75 MCG tablet TAKE 1 TABLET BY MOUTH EVERY DAY BEFORE BREAKFAST 09/22/22   Copland, [name], MD  Multiple Vitamin (MULTIVITAMIN) tablet Take 1 tablet by mouth daily.    [provider]  ondansetron (ZOFRAN) 4 MG tablet Take 1 tablet (4 mg total) by mouth every 8 (eight) hours as needed for nausea or vomiting. 09/10/22   Copland, [name], MD  Probiotic Product (PROBIOTIC PO) Take 1 capsule by mouth daily.     [provider]  traMADol (ULTRAM) 50 MG tablet TAKE 1/2-1 TABLET BY MOUTH EVERY 6-8 HOURS AS NEEDED. DO NOT COMBINE WITH HYDROCODONE 01/21/23   Copland, [name], MD      Allergies    Amlodipine, Prochlorperazine edisylate, Aspirin, Cymbalta [duloxetine hcl], and Pamelor [nortriptyline hcl]    Review of Systems   Review of Systems  All other systems reviewed and are negative.   Physical Exam Updated Vital Signs BP (!) 174/95 (BP Location: Right Arm)   Pulse 83   Temp 97.7 F (36.5 C) (Oral)   Resp 16   SpO2 98%  Physical Exam Vitals and nursing note reviewed.  Constitutional:      General: She is not in acute distress.    Appearance: She is well-developed. She is not diaphoretic.  HENT:     Head: Normocephalic and  atraumatic.  Cardiovascular:     Rate and Rhythm: Normal rate and regular rhythm.     Heart sounds: No murmur heard.    No friction rub. No gallop.  Pulmonary:     Effort: Pulmonary effort is normal. No respiratory distress.     Breath sounds: Normal breath sounds. No wheezing.  Abdominal:     General: Bowel sounds are normal. There is no distension.     Palpations: Abdomen is soft.     Tenderness: There is no abdominal tenderness.  Musculoskeletal:        General: Normal range of motion.     Cervical back: Normal range of motion and neck supple.  Skin:    General: Skin is warm and dry.  Neurological:     General: No focal deficit present.     Mental Status:  She is alert and oriented to person, place, and time.     ED Results / Procedures / Treatments   Labs (all labs ordered are listed, but only abnormal results are displayed) Labs Reviewed  BASIC METABOLIC PANEL - Abnormal; Notable for the following components:      Result Value   Sodium 131 (*)    Chloride 96 (*)    Glucose, Bld 105 (*)    BUN 24 (*)    Creatinine, Ser 1.06 (*)    GFR, Estimated 50 (*)    All other components within normal limits  CBC - Abnormal; Notable for the following components:   Hemoglobin 11.8 (*)    All other components within normal limits  TROPONIN I (HIGH SENSITIVITY)    EKG EKG Interpretation Date/Time:  Saturday April 11 2023 23:47:29 EST Ventricular Rate:  82 PR Interval:  155 QRS Duration:  146 QT Interval:  427 QTC Calculation: 499 R Axis:   -74  Text Interpretation: Sinus rhythm Atrial premature complex Left atrial enlargement Left bundle branch block No significant change since 01/07/2023 Confirmed by Geoffery Lyons ([id number]) on 04/11/2023 11:51:23 PM  Radiology DG Chest 2 View Result Date: 04/12/2023 CLINICAL DATA:  Hypertension, nausea, chest pain, shortness of breath EXAM: CHEST - 2 VIEW COMPARISON:  08/18/2022, CT 11/20/2022 FINDINGS: Heart and mediastinal contours are within normal limits. Patchy bilateral airspace disease and scattered pulmonary nodules are similar to prior study. No visible effusions. No acute bony abnormality. IMPRESSION: Patchy bilateral opacities with bilateral pulmonary nodules, similar to prior CT. Electronically Signed   By: Charlett Nose M.D.   On: 04/12/2023 00:07    Procedures Procedures    Medications Ordered in ED Medications  sodium chloride 0.9 % bolus 500 mL (has no administration in time range)    ED Course/ Medical Decision Making/ A&P  Patient is a 89 year old female presenting with an episode of feeling flushed.  She has had these episodes in the past, however no reason has been found.   Patient arrives here with stable vital signs and is afebrile.  Physical examination is unremarkable and patient is neurologically intact.  Laboratory studies obtained including CBC and basic metabolic panel, both of which are unremarkable.  Troponin is negative.  Chest x-ray showing bilateral patchy opacities similar to prior CT.  Patient has been observed for several hours in the ER and seems to be feeling better.  She was hydrated with 500 cc of normal saline as she was concerned she may be dehydrated.  Cause of these episodes unclear, but nothing this evening appears emergent.  Patient to be discharged with as needed return.  Final  Clinical Impression(s) / ED Diagnoses Final diagnoses:  None    Rx / DC Orders ED Discharge Orders     None         Geoffery Lyons, MD 04/12/23 [phone]

## 2023-04-13 ENCOUNTER — Encounter: Payer: Self-pay | Admitting: Family Medicine

## 2023-04-13 ENCOUNTER — Ambulatory Visit: Payer: Medicare PPO | Admitting: Family Medicine

## 2023-04-13 VITALS — BP 134/72 | HR 86 | Ht 60.0 in | Wt 110.4 lb

## 2023-04-13 DIAGNOSIS — H6123 Impacted cerumen, bilateral: Secondary | ICD-10-CM

## 2023-04-13 NOTE — Patient Instructions (Signed)
 Good to see you today! We moved wax from your left ear canal There was a membrane of dead skin over the right ear canal which we were able to move today, but your ear canal bled a tiny bit.  If you have any more than  a few drops of blood, or if your hearing does not seem to be normal please let me know!

## 2023-04-16 ENCOUNTER — Telehealth: Payer: Self-pay | Admitting: Cardiology

## 2023-04-16 NOTE — Telephone Encounter (Signed)
 Patient identification verified by 2 forms. Shade Flood, RN     Called and spoke to patient  Patient states:  - Reported hypertension the past week. Readings are 176/101  92 174/92   83,  169/98   90,  159/95    91   -Feeling tremors in hand on Saturday night along with hypertension so she went to ER.   - patient says received IVF at ER and was diagnosed with dehydration.  - She was told at last visit she could take additional dose of her carvedilol if she experienced hypertension.  - Has taken her carvedilol this morning.BP before medication was 159/95 HR and after was 145/87 HR 77.   Patient denies:  - SOB, blurred vision, headaches, nausea, vomiting, chest pain, fatigue             Interventions/Plan: - Reviewed information with DOD. Per DOD patient to continue current dose of carvedilol, continue recording Bps daily before and after medication and route encounter to primary cardiologist for further advisement.    Reviewed ED warning signs/precautions  Patient agrees with plan, no questions at this time

## 2023-04-16 NOTE — Telephone Encounter (Signed)
 Pt c/o BP issue: STAT if pt c/o blurred vision, one-sided weakness or slurred speech  1. What are your last 5 BP readings? 176/101 92, 174/92 83, 169/98 90, 159/95 91  2. Are you having any other symptoms (ex. Dizziness, headache, blurred vision, passed out)? No   3. What is your BP issue? Pt went to ER Sat because of BP being high

## 2023-04-17 ENCOUNTER — Telehealth: Payer: Self-pay | Admitting: Pharmacist

## 2023-04-17 ENCOUNTER — Other Ambulatory Visit: Payer: Self-pay | Admitting: Pharmacist

## 2023-04-17 DIAGNOSIS — M810 Age-related osteoporosis without current pathological fracture: Secondary | ICD-10-CM

## 2023-04-17 DIAGNOSIS — Z79899 Other long term (current) drug therapy: Secondary | ICD-10-CM

## 2023-04-17 DIAGNOSIS — M0609 Rheumatoid arthritis without rheumatoid factor, multiple sites: Secondary | ICD-10-CM

## 2023-04-17 NOTE — Telephone Encounter (Signed)
 Received notification from [facility] regarding a pre-authorization for PROLIA [phone]). Authorization has been APPROVED from 04/17/2023 to 02/17/2024. Approval letter sent to scan center.  Authorization # [id number]  Chesley Mires, PharmD, MPH, BCPS, CPP Clinical Pharmacist (Rheumatology and Pulmonology)

## 2023-04-17 NOTE — Telephone Encounter (Signed)
 Submitted a Prior Authorization RENEWAL request to [facility] for PROLIA via CoverMyMeds. Will update once we receive a response.  Key: [id number]

## 2023-04-17 NOTE — Progress Notes (Signed)
 Next Prolia SQ due on 03/30/2023. Diagnosis: age-related osteoporosis  Dose: 60 mg SQ every 6 months  Last Clinic Visit: 01/27/2023 Next Clinic Visit: not scheduled  Last Prolia dose: 10/01/2022  Labs:  CBC, BMP on 04/11/2023  Orders placed for Prolia x 1 dose. No premedicatons required.  Ideally administer on 04/29/2023 with Orencia infusion  Chesley Mires, PharmD, MPH, BCPS, CPP Clinical Pharmacist (Rheumatology and Pulmonology)

## 2023-04-20 MED ORDER — CARVEDILOL 12.5 MG PO TABS: 12.5000 mg | ORAL_TABLET | Freq: Two times a day (BID) | ORAL | 3 refills | Status: DC

## 2023-04-20 NOTE — Telephone Encounter (Signed)
 Left message for pt to call.

## 2023-04-20 NOTE — Telephone Encounter (Signed)
Spoke with pt, Aware of dr crenshaw's recommendations. New script sent to the pharmacy  

## 2023-04-22 ENCOUNTER — Other Ambulatory Visit: Payer: Self-pay | Admitting: Family Medicine

## 2023-04-22 DIAGNOSIS — G8929 Other chronic pain: Secondary | ICD-10-CM

## 2023-04-22 DIAGNOSIS — M25512 Pain in left shoulder: Secondary | ICD-10-CM

## 2023-04-25 ENCOUNTER — Other Ambulatory Visit: Payer: Self-pay

## 2023-04-29 ENCOUNTER — Encounter (HOSPITAL_COMMUNITY)
Admission: RE | Admit: 2023-04-29 | Discharge: 2023-04-29 | Disposition: A | Discharge status: Home / Self Care | Payer: Medicare PPO | Source: Ambulatory Visit | Attending: Rheumatology | Admitting: Rheumatology

## 2023-04-29 DIAGNOSIS — E7800 Pure hypercholesterolemia, unspecified: Secondary | ICD-10-CM

## 2023-04-29 DIAGNOSIS — M0609 Rheumatoid arthritis without rheumatoid factor, multiple sites: Secondary | ICD-10-CM

## 2023-04-29 DIAGNOSIS — Z5181 Encounter for therapeutic drug level monitoring: Secondary | ICD-10-CM | POA: Diagnosis not present

## 2023-04-29 DIAGNOSIS — Z13220 Encounter for screening for lipoid disorders: Secondary | ICD-10-CM | POA: Diagnosis not present

## 2023-04-29 DIAGNOSIS — Z79899 Other long term (current) drug therapy: Secondary | ICD-10-CM | POA: Diagnosis not present

## 2023-04-29 DIAGNOSIS — M069 Rheumatoid arthritis, unspecified: Secondary | ICD-10-CM

## 2023-04-29 DIAGNOSIS — M810 Age-related osteoporosis without current pathological fracture: Secondary | ICD-10-CM

## 2023-04-29 LAB — LIPID PANEL
Cholesterol: 207 mg/dL — ABNORMAL HIGH (ref 0–200)
HDL: 55 mg/dL (ref >40)
LDL Cholesterol: 118 mg/dL — ABNORMAL HIGH (ref 0–99)
Total CHOL/HDL Ratio: 3.8 ratio
Triglycerides: 168 mg/dL — ABNORMAL HIGH (ref <150)
VLDL: 34 mg/dL (ref 0–40)

## 2023-04-29 MED ORDER — DENOSUMAB 60 MG/ML ~~LOC~~ SOSY
PREFILLED_SYRINGE | SUBCUTANEOUS | Status: AC
  Administered 2023-04-29: 60 mg via SUBCUTANEOUS
  Filled 2023-04-29: qty 1

## 2023-04-29 MED ORDER — DENOSUMAB 60 MG/ML ~~LOC~~ SOSY: 60.0000 mg | PREFILLED_SYRINGE | Freq: Once | SUBCUTANEOUS | Status: AC

## 2023-04-29 MED ORDER — SODIUM CHLORIDE 0.9 % IV SOLN
500.0000 mg | INTRAVENOUS | Status: DC
  Administered 2023-04-29: 500 mg via INTRAVENOUS
  Filled 2023-04-29: qty 20

## 2023-04-29 MED ORDER — DIPHENHYDRAMINE HCL 25 MG PO CAPS: 25.0000 mg | ORAL_CAPSULE | ORAL | Status: DC

## 2023-04-29 MED ORDER — ACETAMINOPHEN 325 MG PO TABS: 650.0000 mg | ORAL_TABLET | ORAL | Status: DC

## 2023-04-29 NOTE — Progress Notes (Signed)
 Total cholesterol is elevated-207.   Triglycerides are elevated-168.  LDL is elevated-181.  Please notify the patient and recommend discussion with PCP at new follow up visit.

## 2023-04-30 ENCOUNTER — Encounter (HOSPITAL_COMMUNITY): Payer: Medicare PPO

## 2023-04-30 MED ORDER — LOSARTAN POTASSIUM 25 MG PO TABS: 25.0000 mg | ORAL_TABLET | Freq: Every day | ORAL | 3 refills | Status: DC

## 2023-04-30 NOTE — Addendum Note (Signed)
 Addended by: Abbe Amsterdam C on: 04/30/2023 01:01 PM   Modules accepted: Orders

## 2023-05-21 ENCOUNTER — Other Ambulatory Visit: Payer: Self-pay

## 2023-05-21 ENCOUNTER — Encounter: Payer: Self-pay | Admitting: Family Medicine

## 2023-05-21 DIAGNOSIS — R110 Nausea: Secondary | ICD-10-CM

## 2023-05-21 MED ORDER — ONDANSETRON HCL 4 MG PO TABS: 4.0000 mg | ORAL_TABLET | Freq: Three times a day (TID) | ORAL | 1 refills | Status: DC | PRN

## 2023-05-21 NOTE — Addendum Note (Signed)
 Addended by: Abbe Amsterdam C on: 05/21/2023 04:48 PM   Modules accepted: Orders

## 2023-05-27 ENCOUNTER — Encounter (HOSPITAL_COMMUNITY)

## 2023-05-28 MED ORDER — OFLOXACIN 0.3 % OT SOLN
5.0000 [drp] | Freq: Every day | OTIC | 0 refills | Status: AC
Start: 2023-05-28 — End: ?

## 2023-05-28 NOTE — Addendum Note (Signed)
 Addended by: Abbe Amsterdam C on: 05/28/2023 08:26 AM   Modules accepted: Orders

## 2023-05-30 ENCOUNTER — Other Ambulatory Visit: Payer: Self-pay

## 2023-05-31 ENCOUNTER — Other Ambulatory Visit: Payer: Self-pay | Admitting: Family Medicine

## 2023-06-03 ENCOUNTER — Encounter (HOSPITAL_COMMUNITY)
Admission: RE | Admit: 2023-06-03 | Discharge: 2023-06-03 | Disposition: A | Discharge status: Home / Self Care | Source: Ambulatory Visit | Attending: Rheumatology | Admitting: Rheumatology

## 2023-06-03 DIAGNOSIS — M0609 Rheumatoid arthritis without rheumatoid factor, multiple sites: Secondary | ICD-10-CM

## 2023-06-03 DIAGNOSIS — M069 Rheumatoid arthritis, unspecified: Secondary | ICD-10-CM | POA: Diagnosis not present

## 2023-06-03 DIAGNOSIS — Z5181 Encounter for therapeutic drug level monitoring: Secondary | ICD-10-CM | POA: Diagnosis not present

## 2023-06-03 DIAGNOSIS — E7800 Pure hypercholesterolemia, unspecified: Secondary | ICD-10-CM

## 2023-06-03 DIAGNOSIS — Z79899 Other long term (current) drug therapy: Secondary | ICD-10-CM | POA: Diagnosis not present

## 2023-06-03 LAB — CBC WITH DIFFERENTIAL/PLATELET
Abs Immature Granulocytes: 0.02 10*3/uL (ref 0.00–0.07)
Basophils Absolute: 0 10*3/uL (ref 0.0–0.1)
Basophils Relative: 1 %
Eosinophils Absolute: 0.1 10*3/uL (ref 0.0–0.5)
Eosinophils Relative: 1 %
HCT: 35.7 % — ABNORMAL LOW (ref 36.0–46.0)
Hemoglobin: 11.7 g/dL — ABNORMAL LOW (ref 12.0–15.0)
Immature Granulocytes: 0 %
Lymphocytes Relative: 25 %
Lymphs Abs: 1.4 10*3/uL (ref 0.7–4.0)
MCH: 29.9 pg (ref 26.0–34.0)
MCHC: 32.8 g/dL (ref 30.0–36.0)
MCV: 91.3 fL (ref 80.0–100.0)
Monocytes Absolute: 0.6 10*3/uL (ref 0.1–1.0)
Monocytes Relative: 10 %
Neutro Abs: 3.4 10*3/uL (ref 1.7–7.7)
Neutrophils Relative %: 63 %
Platelets: 200 10*3/uL (ref 150–400)
RBC: 3.91 MIL/uL (ref 3.87–5.11)
RDW: 12.9 % (ref 11.5–15.5)
WBC: 5.4 10*3/uL (ref 4.0–10.5)
nRBC: 0 % (ref 0.0–0.2)

## 2023-06-03 LAB — COMPREHENSIVE METABOLIC PANEL WITH GFR
ALT: 15 U/L (ref 0–44)
AST: 28 U/L (ref 15–41)
Albumin: 3.8 g/dL (ref 3.5–5.0)
Alkaline Phosphatase: 32 U/L — ABNORMAL LOW (ref 38–126)
Anion gap: 8 (ref 5–15)
BUN: 26 mg/dL — ABNORMAL HIGH (ref 8–23)
CO2: 26 mmol/L (ref 22–32)
Calcium: 9.9 mg/dL (ref 8.9–10.3)
Chloride: 98 mmol/L (ref 98–111)
Creatinine, Ser: 1.1 mg/dL — ABNORMAL HIGH (ref 0.44–1.00)
GFR, Estimated: 48 mL/min — ABNORMAL LOW (ref >60)
Glucose, Bld: 89 mg/dL (ref 70–99)
Potassium: 5 mmol/L (ref 3.5–5.1)
Sodium: 132 mmol/L — ABNORMAL LOW (ref 135–145)
Total Bilirubin: 0.6 mg/dL (ref 0.0–1.2)
Total Protein: 6.9 g/dL (ref 6.5–8.1)

## 2023-06-03 MED ORDER — DIPHENHYDRAMINE HCL 25 MG PO CAPS: 25.0000 mg | ORAL_CAPSULE | ORAL | Status: DC

## 2023-06-03 MED ORDER — SODIUM CHLORIDE 0.9 % IV SOLN
500.0000 mg | INTRAVENOUS | Status: DC
  Administered 2023-06-03: 500 mg via INTRAVENOUS
  Filled 2023-06-03: qty 20

## 2023-06-03 MED ORDER — ACETAMINOPHEN 325 MG PO TABS: 650.0000 mg | ORAL_TABLET | ORAL | Status: DC

## 2023-06-03 NOTE — Progress Notes (Signed)
 Creatinine remains elevated at 1.10 and GFR was 48.  Discuss the importance of avoiding the use of NSAIDs. Alk phos remains low but has improved.  Sodium remains low but stable. Hemoglobin and hematocrit remain low but stable.  Rest of CBC within normal limits.

## 2023-06-17 ENCOUNTER — Other Ambulatory Visit: Payer: Self-pay

## 2023-06-22 ENCOUNTER — Encounter: Payer: Self-pay | Admitting: Family Medicine

## 2023-06-22 ENCOUNTER — Ambulatory Visit: Admitting: Family Medicine

## 2023-06-22 VITALS — BP 142/80 | HR 79 | Temp 98.0°F | Ht 60.0 in | Wt 109.8 lb

## 2023-06-22 DIAGNOSIS — R110 Nausea: Secondary | ICD-10-CM

## 2023-06-22 DIAGNOSIS — R8281 Pyuria: Secondary | ICD-10-CM | POA: Diagnosis not present

## 2023-06-22 DIAGNOSIS — E871 Hypo-osmolality and hyponatremia: Secondary | ICD-10-CM

## 2023-06-22 LAB — TROPONIN I: Troponin I: 11 ng/L (ref <=47)

## 2023-06-22 LAB — POCT URINALYSIS DIP (MANUAL ENTRY)
Bilirubin, UA: NEGATIVE
Blood, UA: NEGATIVE
Glucose, UA: NEGATIVE mg/dL
Ketones, POC UA: NEGATIVE mg/dL
Leukocytes, UA: NEGATIVE
Nitrite, UA: NEGATIVE
Protein Ur, POC: NEGATIVE mg/dL
Spec Grav, UA: 1.015 (ref 1.010–1.025)
Urobilinogen, UA: 0.2 U/dL
pH, UA: 5 (ref 5.0–8.0)

## 2023-06-22 NOTE — Patient Instructions (Addendum)
 It was good to see you today- I will be in touch with your labs asap  Your EKG does not show any major new findings that I can see I will get a troponin lab however to make sure no sign of heart muscle distress

## 2023-06-22 NOTE — Progress Notes (Addendum)
 [facility] [address] [place], [place] [address] [phone]  Date:  06/22/2023   Name:  [name]   DOB:  [birth year]   MRN:  [id number]  PCP:  Kaylee Partridge, MD    Chief Complaint: Follow-up (Sinus pressure, nausea and dizziness /Pts daughter states, pt sat down to read her bible and felt nauseated /Low grade fever )   History of Present Illness:  [name] is a 89 y.o. very pleasant female patient who presents with the following:  Pt seen today with concern of not feeing quite herself, she reached out to me yesterday and we made this appointment.  She is seen today accompanied by her daughter Edwina Gram.  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   We irrigated her ears about a month ago and she has noted more vertigo sx since I called in floxin  otic and zofran  about 4 weeks ago for concern of possible ear canal redness and nausea She contacted us  yesterday and we made this appt : Another night of drinking a largebottle of GatorLyte plus lots of water  trying to get my temperature down from 162/100. Was finally able to do so by taking one of the losartan  pills!   But my head still felt heavy and I experienced a little nausea, especially if I tried to lie flat in my bed. So I just tried to sleep a little in my EZ chair.  I'm not sure what's causing these spells, but I think it has something to do with my inner ear that has still been bothering me ever since I saw you and you flushed them out!  I just hope it doesn't have anything to do with the lung cancer because the oncologist did tell me that if the cancer did  spread that the next place would probably be the head! Any suggestions? I just hope I don't go through that again tonight!  She notes a  feeling of pressure in her sinuses/ head,some headache but not just in the frontal area Temp of 99.8 yesterday and her BP is "up and down a lot"  No ST Ears don't hurt but she feels '"like I'm talking in a barrel"   She has noted nausea for a few weeks- will come and go No abdominal pain, no vomiting or diarrhea  No head injury or falls  No cough No urinary sx noted   She denies any chest pain, shortness of breath, palpitations Patient Active Problem List   Diagnosis Date Noted   Diarrhea of presumed infectious origin 08/22/2022   Colitis 08/18/2022   Acute congestive heart failure (HCC) 12/29/2021   Acute respiratory failure (HCC) 12/24/2021   Acute respiratory failure with hypoxia (HCC) 12/23/2021   Acute on chronic combined systolic and diastolic CHF (congestive heart failure) (HCC) 12/23/2021   Rectal pain 10/10/2020   Acute posthemorrhagic anemia    Rectal bleeding 06/17/2019   Grade II internal hemorrhoids    Rectal ulcer    Popliteal artery occlusion, 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/10/2018   HTN (hypertension) 04/10/2018   Femoral artery pseudo-aneurysm, 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/09/2018   Anemia of chronic disease 02/24/2018   Unstable right hip arthroplasty 01/24/2018   History of revision of total replacement of right hip joint 01/24/2018   Hypovolemic shock (HCC)    Hyperkalemia    Hyponatremia  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   Status post revision of total hip 11/27/2017   Elevated cholesterol 10/11/2015   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HCC) 10/04/2014   Bilateral leg edema 08/01/2014   Elevated BP 08/01/2014   Rheumatoid arthritis involving multiple joints (HCC) 05/30/2014   Status post total replacement of right hip 04/28/2014   Long-term use of high-risk medication 11/11/2013   Symptomatic anemia 11/11/2013   Neuropathic pain 07/07/2013   Constipation due to pain medication 07/07/2013   Protein-calorie malnutrition, severe (HCC) 06/08/2013   Lung cancer, Right  upper lobe 05/08/2013   Sciatica of right side 08/13/2011   Osteoporosis 03/07/2010   PARESTHESIA 03/07/2010   Low back pain 03/16/2009   CT, CHEST, ABNORMAL 12/18/2008   ABNORMAL ECHOCARDIOGRAM 12/14/2008   SJOGREN'S SYNDROME 11/29/2008   HYPOGLYCEMIA 06/29/2006   RAYNAUD'S DISEASE 06/29/2006    Past Medical History:  Diagnosis Date   Arthralgia of multiple joints    followed by dr ziolkowska   Arthritis    C. difficile colitis 08/2022   Cardiomyopathy (HCC)    Chronic constipation    Chronic inflammatory arthritis    rhemotolgist-  dr a. ziolkowska ([facility])   Dry eyes    eye drops used    GERD (gastroesophageal reflux disease)    H/O discoid lupus erythematosus    Hiatal hernia    History of colon polyps    Hypothyroidism    Iron deficiency anemia    LBBB (left bundle branch block) 2010   Mitchell's disease (erythromelalgia) (HCC)    neurologist-  dr patel   Nocturia    Non-small cell cancer of right lung [facility]) surgeon-- dr gerhardt/  oncologist-  dr Marguerita Shih--- per lov notes no recurrence/   11-18-2017 per pt denies any symptoms   dx 2015--  Stage IIA (T2b,N0,M0) , +EGFR  mutation in exon 21, non-small cell adenocarcinoma right upper lobe---  s/p  Right upper lobectomy , right middley wedge resection and node dissection---  no chemo or radiation therapy   OA (osteoarthritis)    hands   Osteoporosis    PONV (postoperative nausea and vomiting)    likes phenergan    Raynaud's phenomenon 1965   Renal insufficiency    Rheumatoid arthritis (HCC)    Sciatica    Scoliosis    Sjogren's syndrome [facility])     Past Surgical History:  Procedure Laterality Date   ANTERIOR HIP REVISION Right 11/27/2017   Procedure: RIGHT HIP ACETABULAR REVISION;  Surgeon: Arnie Lao, MD;  Location: [facility] ORS;  Service: Orthopedics;  Laterality: Right;   ANTERIOR HIP REVISION Right 01/24/2018   Procedure: OPEN REDUCTION OF DISLOCATED ANTERIOR HIP WITH REVISION OF LINER AND HIP  BALL;  Surgeon: Arnie Lao, MD;  Location: [facility]S;  Service: Orthopedics;  Laterality: Right;   APPENDECTOMY  1950s   BIOPSY  04/14/2018   Procedure: BIOPSY;  Surgeon: Ace Holder, MD;  Location: [facility] ENDOSCOPY;  Service: Gastroenterology;;   BIOPSY  04/16/2018   Procedure: BIOPSY;  Surgeon: Normie Becton., MD;  Location: [facility] ENDOSCOPY;  Service: Gastroenterology;;   CARDIOVASCULAR STRESS TEST  12/2008    mild fixed basal to mid septal perfusion defect felt likely due to artifact from LBBB, no ischemia, EF 58%   COLONOSCOPY     COLONOSCOPY WITH PROPOFOL  N/A 04/16/2018   Procedure: COLONOSCOPY WITH PROPOFOL ;  Surgeon: Normie Becton., MD;  Location: [facility] ENDOSCOPY;  Service: Gastroenterology;  Laterality: N/A;   ESOPHAGOGASTRODUODENOSCOPY (EGD) WITH PROPOFOL  N/A 04/14/2018  Procedure: ESOPHAGOGASTRODUODENOSCOPY (EGD) WITH PROPOFOL ;  Surgeon: Ace Holder, MD;  Location: [facility] ENDOSCOPY;  Service: Gastroenterology;  Laterality: N/A;   FEMORAL-POPLITEAL BYPASS GRAFT Right 04/10/2018   Procedure: REPAIR RIGHT FEMORAL ARTERY PSEUDOANEURYSM, RETROPERITONEAL EXPOSURE OF ILIAC ARTERY, RIGHT POPLITEAL EMBOLECTOMY;  Surgeon: Dannis Dy, MD;  Location: [facility] OR;  Service: Vascular;  Laterality: Right;   FLEXIBLE SIGMOIDOSCOPY N/A 06/17/2019   Procedure: FLEXIBLE SIGMOIDOSCOPY;  Surgeon: Annis Kinder, DO;  Location: [facility] ENDOSCOPY;  Service: Gastroenterology;  Laterality: N/A;   HEMOSTASIS CLIP PLACEMENT  06/17/2019   Procedure: HEMOSTASIS CLIP PLACEMENT;  Surgeon: Annis Kinder, DO;  Location: [facility] ENDOSCOPY;  Service: Gastroenterology;;   LYMPH NODE DISSECTION Right 06/07/2013   Procedure: LYMPH NODE DISSECTION;  Surgeon: Norita Beauvais, MD;  Location: [facility] OR;  Service: Thoracic;  Laterality: Right;   PATCH ANGIOPLASTY Right 04/10/2018   Procedure: PATCH  ANGIOPLASTY OF RIGHT FEMORAL ARTERY USING BOVINE PATCH, PATCH ANGIOPLASTY OF RIGHT POPLITEAL ARTERY  USING BOVINE PATCH;  Surgeon: Dannis Dy, MD;  Location: [facility] OR;  Service: Vascular;  Laterality: Right;   SCHLEROTHERAPY  06/17/2019   Procedure: [place] OF VARICES;  Surgeon: Annis Kinder, DO;  Location: [facility] ENDOSCOPY;  Service: Gastroenterology;;   THORACIC SYMPATHETECTOMY  1965   "large incision from chest to up to shoulder, the nerves were tied together, for raynaud's   THORACOTOMY  06/07/2013   Procedure: MINI/LIMITED THORACOTOMY; right middle lobe wedge resection;  Surgeon: Norita Beauvais, MD;  Location: [facility] OR;  Service: Thoracic;;   TONSILLECTOMY  child   TOTAL ABDOMINAL HYSTERECTOMY  1980's    W/ BSO   TOTAL HIP ARTHROPLASTY Right 04/28/2014   Procedure: RIGHT TOTAL HIP ARTHROPLASTY ANTERIOR APPROACH;  Surgeon: Arnie Lao, MD;  Location: [facility]S;  Service: Orthopedics;  Laterality: Right;   TRANSTHORACIC ECHOCARDIOGRAM  12/11/2008   ef 45-50%, grade 1 diastolic dysfunction/  mild LAE/  mild AR and MR/  trivial TR   VIDEO ASSISTED THORACOSCOPY (VATS)/WEDGE RESECTION Right 06/07/2013   Procedure: VIDEO ASSISTED THORACOSCOPY (VATS)/right upper lobectomy, On Q;  Surgeon: Norita Beauvais, MD;  Location: [facility] OR;  Service: Thoracic;  Laterality: Right;   VIDEO BRONCHOSCOPY N/A 06/07/2013   Procedure: VIDEO BRONCHOSCOPY;  Surgeon: Norita Beauvais, MD;  Location: [facility] OR;  Service: Thoracic;  Laterality: N/A;   VIDEO BRONCHOSCOPY WITH ENDOBRONCHIAL NAVIGATION N/A 05/04/2013   Procedure: VIDEO BRONCHOSCOPY WITH ENDOBRONCHIAL NAVIGATION;  Surgeon: Norita Beauvais, MD;  Location: [facility] OR;  Service: Thoracic;  Laterality: N/A;    Social History   Tobacco Use   Smoking status: Never    Passive exposure: Past   Smokeless tobacco: Never  Vaping Use   Vaping status: Never Used  Substance Use Topics   Alcohol  use: Not Currently   Drug use: Never    Family History  Problem Relation Age of Onset   Other Mother 20       MVA   Coronary artery disease Father     Colon cancer Father    Diabetes Father    Cancer Father        colon   Healthy Sister    Pneumonia Sister    Healthy Brother    Other Brother        killed in war   Healthy Daughter    Hypothyroidism Daughter    Healthy Daughter    Esophageal cancer Neg Hx    Kidney disease Neg Hx    Liver disease Neg Hx  Allergies  Allergen Reactions   Amlodipine  Rash   Prochlorperazine Edisylate Anaphylaxis    Compazine--- tongue swells and rash   Aspirin  Other (See Comments)    Nose bleeds. Cannot take NSAIDS    Cymbalta [Duloxetine Hcl] Diarrhea, Nausea And Vomiting and Other (See Comments)    Increased blood pressure   Pamelor  [Nortriptyline  Hcl] Diarrhea and Nausea Only    Increased Heart rate and BP    Medication list has been reviewed and updated.  Current Outpatient Medications on File Prior to Visit  Medication Sig Dispense Refill   acetaminophen  (TYLENOL ) 500 MG tablet Take 1 tablet (500 mg total) by mouth every 6 (six) hours as needed (pain). 30 tablet 0   Biotin  1000 MCG tablet Take 1,000 mcg by mouth 2 (two) times daily.     carvedilol  (COREG ) 12.5 MG tablet Take 1 tablet (12.5 mg total) by mouth 2 (two) times daily with a meal. 180 tablet 3   estradiol  (ESTRACE ) 0.1 MG/GM vaginal cream Place one gram vaginally up to three times a week as needed to maintain comfort 42.5 g 5   famotidine  (PEPCID ) 20 MG tablet Take 1 tablet (20 mg total) by mouth daily. 90 tablet 3   gabapentin  (NEURONTIN ) 300 MG capsule Take 1 capsule (300 mg total) by mouth 3 (three) times daily. 360 capsule 1   HYDROcodone -acetaminophen  (NORCO/VICODIN) 5-325 MG tablet Take 1 tablet by mouth 2 (two) times daily as needed for moderate pain (pain score 4-6). 30 tablet 0   levothyroxine  (SYNTHROID ) 75 MCG tablet TAKE 1 TABLET BY MOUTH EVERY DAY BEFORE BREAKFAST 90 tablet 1   losartan  (COZAAR ) 25 MG tablet Take 1 tablet (25 mg total) by mouth daily. 30 tablet 3   Multiple Vitamin (MULTIVITAMIN) tablet Take 1  tablet by mouth daily.     ofloxacin  (FLOXIN ) 0.3 % OTIC solution Place 5-10 drops into both ears daily. Use for 10 days 5 mL 0   ondansetron  (ZOFRAN ) 4 MG tablet Take 1 tablet (4 mg total) by mouth every 8 (eight) hours as needed for nausea or vomiting. 30 tablet 1   Probiotic Product (PROBIOTIC PO) Take 1 capsule by mouth daily.      traMADol  (ULTRAM ) 50 MG tablet TAKE 1/2-1 TABLET BY MOUTH EVERY 8 HOURS AS NEEDED. DO NOT COMBINE WITH OTHER PAIN MEDICATION 60 tablet 2   No current facility-administered medications on file prior to visit.    Review of Systems:  As per HPI- otherwise negative.   Physical Examination: Vitals:   06/22/23 1509  BP: (!) 142/80  Pulse: 79  Temp: 98 F (36.7 C)  SpO2: 98%   Vitals:   06/22/23 1509  Weight: 109 lb 12.8 oz (49.8 kg)  Height: 5' (1.524 m)   Body mass index is 21.44 kg/m. Ideal Body Weight: Weight in (lb) to have BMI = 25: 127.7  GEN: no acute distress.  Petite build, looks her normal self but a bit tired HEENT: Atraumatic, Normocephalic.  Ears and Nose: No external deformity. CV: RRR, No M/G/R. No JVD. No thrill. No extra heart sounds. PULM: CTA B, no wheezes, crackles, rhonchi. No retractions. No resp. distress. No accessory muscle use. ABD: S, NT, ND, +BS. No rebound. No HSM. EXTR: No c/c/e PSYCH: Normally interactive. Conversant.      Our EKG machine would not record today- took photo as above Old EKG from 03/22/23 shows Lt BBB  Results for orders placed or performed in visit on 06/22/23  POCT urinalysis dipstick  Collection Time: 06/22/23  3:47 PM  Result Value Ref Range   Color, UA yellow yellow   Clarity, UA clear clear   Glucose, UA negative negative mg/dL   Bilirubin, UA negative negative   Ketones, POC UA negative negative mg/dL   Spec Grav, UA 0.981 1.914 - 1.025   Blood, UA negative negative   pH, UA 5.0 5.0 - 8.0   Protein Ur, POC negative negative mg/dL   Urobilinogen, UA 0.2 0.2 or 1.0 E.U./dL    Nitrite, UA Negative Negative   Leukocytes, UA Negative Negative  CBC   Collection Time: 06/22/23  4:30 PM  Result Value Ref Range   WBC 5.0 4.0 - 10.5 K/uL   RBC 4.12 3.87 - 5.11 Mil/uL   Platelets 216.0 150.0 - 400.0 K/uL   Hemoglobin 12.2 12.0 - 15.0 g/dL   HCT 78.2 95.6 - 21.3 %   MCV 90.4 78.0 - 100.0 fl   MCHC 32.8 30.0 - 36.0 g/dL   RDW 08.6 57.8 - 46.9 %  Comprehensive metabolic panel with GFR   Collection Time: 06/22/23  4:30 PM  Result Value Ref Range   Sodium 130 (L) 135 - 145 mEq/L   Potassium 5.0 3.5 - 5.1 mEq/L   Chloride 93 (L) 96 - 112 mEq/L   CO2 28 19 - 32 mEq/L   Glucose, Bld 96 70 - 99 mg/dL   BUN 27 (H) 6 - 23 mg/dL   Creatinine, Ser 6.29 0.40 - 1.20 mg/dL   Total Bilirubin 0.3 0.2 - 1.2 mg/dL   Alkaline Phosphatase 34 (L) 39 - 117 U/L   AST 25 0 - 37 U/L   ALT 13 0 - 35 U/L   Total Protein 7.5 6.0 - 8.3 g/dL   Albumin  4.5 3.5 - 5.2 g/dL   GFR 52.84 (L) >13.24 mL/min   Calcium  9.6 8.4 - 10.5 mg/dL  TSH   Collection Time: 06/22/23  4:30 PM  Result Value Ref Range   TSH 1.92 0.35 - 5.50 uIU/mL  Troponin I   Collection Time: 06/22/23  4:30 PM  Result Value Ref Range   Troponin I 11 < OR = 47 ng/L   *Note: Due to a large number of results and/or encounters for the requested time period, some results have not been displayed. A complete set of results can be found in Results Review.     Assessment and Plan: Nausea - Plan: POCT urinalysis dipstick, Urine Culture, EKG 12-Lead, CBC, Comprehensive metabolic panel with GFR, TSH, Troponin I  Patient seen today with nonspecific nausea and not feeling quite herself.  UA is reassuring.  She denies any cough, no evidence of pneumonia.  Her EKG is relatively noncontributory due to baseline bundle branch block.  She denies any chest pain or shortness of breath, but I will obtain a troponin given her age and risk occult myocardial ischemia  She plans to rest, patient and her daughter will let me know if she is not  getting back to normal in the next couple of days.  I will be in touch with her labs  Signed Gates Kasal, MD  Received her troponin, normal I will be in touch with her pending the rest of her labs  Addendum 5/6, received the rest of her labs as below.  Message to patient Results for orders placed or performed in visit on 06/22/23  POCT urinalysis dipstick   Collection Time: 06/22/23  3:47 PM  Result Value Ref Range   Color, UA yellow  yellow   Clarity, UA clear clear   Glucose, UA negative negative mg/dL   Bilirubin, UA negative negative   Ketones, POC UA negative negative mg/dL   Spec Grav, UA 0.865 7.846 - 1.025   Blood, UA negative negative   pH, UA 5.0 5.0 - 8.0   Protein Ur, POC negative negative mg/dL   Urobilinogen, UA 0.2 0.2 or 1.0 E.U./dL   Nitrite, UA Negative Negative   Leukocytes, UA Negative Negative  CBC   Collection Time: 06/22/23  4:30 PM  Result Value Ref Range   WBC 5.0 4.0 - 10.5 K/uL   RBC 4.12 3.87 - 5.11 Mil/uL   Platelets 216.0 150.0 - 400.0 K/uL   Hemoglobin 12.2 12.0 - 15.0 g/dL   HCT 96.2 95.2 - 84.1 %   MCV 90.4 78.0 - 100.0 fl   MCHC 32.8 30.0 - 36.0 g/dL   RDW 32.4 40.1 - 02.7 %  Comprehensive metabolic panel with GFR   Collection Time: 06/22/23  4:30 PM  Result Value Ref Range   Sodium 130 (L) 135 - 145 mEq/L   Potassium 5.0 3.5 - 5.1 mEq/L   Chloride 93 (L) 96 - 112 mEq/L   CO2 28 19 - 32 mEq/L   Glucose, Bld 96 70 - 99 mg/dL   BUN 27 (H) 6 - 23 mg/dL   Creatinine, Ser 2.53 0.40 - 1.20 mg/dL   Total Bilirubin 0.3 0.2 - 1.2 mg/dL   Alkaline Phosphatase 34 (L) 39 - 117 U/L   AST 25 0 - 37 U/L   ALT 13 0 - 35 U/L   Total Protein 7.5 6.0 - 8.3 g/dL   Albumin  4.5 3.5 - 5.2 g/dL   GFR 66.44 (L) >03.47 mL/min   Calcium  9.6 8.4 - 10.5 mg/dL  TSH   Collection Time: 06/22/23  4:30 PM  Result Value Ref Range   TSH 1.92 0.35 - 5.50 uIU/mL  Troponin I   Collection Time: 06/22/23  4:30 PM  Result Value Ref Range   Troponin I 11 < OR =  47 ng/L   *Note: Due to a large number of results and/or encounters for the requested time period, some results have not been displayed. A complete set of results can be found in Results Review.   Called patient and spoke with her, she has seen her labs.  We discussed mildly low sodium.  She will work on fluid restriction and getting more sodium in her diet.  Plan to have her come in for a lab visit only in about a week and recheck sodium.  However if she is not feeling better or if she starts getting worse she will let me know

## 2023-06-23 ENCOUNTER — Encounter: Payer: Self-pay | Admitting: Internal Medicine

## 2023-06-23 ENCOUNTER — Encounter: Payer: Self-pay | Admitting: Family Medicine

## 2023-06-23 LAB — COMPREHENSIVE METABOLIC PANEL WITH GFR
ALT: 13 U/L (ref 0–35)
AST: 25 U/L (ref 0–37)
Albumin: 4.5 g/dL (ref 3.5–5.2)
Alkaline Phosphatase: 34 U/L — ABNORMAL LOW (ref 39–117)
BUN: 27 mg/dL — ABNORMAL HIGH (ref 6–23)
CO2: 28 meq/L (ref 19–32)
Calcium: 9.6 mg/dL (ref 8.4–10.5)
Chloride: 93 meq/L — ABNORMAL LOW (ref 96–112)
Creatinine, Ser: 1.13 mg/dL (ref 0.40–1.20)
GFR: 42.81 mL/min — ABNORMAL LOW (ref >60.00)
Glucose, Bld: 96 mg/dL (ref 70–99)
Potassium: 5 meq/L (ref 3.5–5.1)
Sodium: 130 meq/L — ABNORMAL LOW (ref 135–145)
Total Bilirubin: 0.3 mg/dL (ref 0.2–1.2)
Total Protein: 7.5 g/dL (ref 6.0–8.3)

## 2023-06-23 LAB — CBC
HCT: 37.2 % (ref 36.0–46.0)
Hemoglobin: 12.2 g/dL (ref 12.0–15.0)
MCHC: 32.8 g/dL (ref 30.0–36.0)
MCV: 90.4 fl (ref 78.0–100.0)
Platelets: 216 10*3/uL (ref 150.0–400.0)
RBC: 4.12 Mil/uL (ref 3.87–5.11)
RDW: 13.2 % (ref 11.5–15.5)
WBC: 5 10*3/uL (ref 4.0–10.5)

## 2023-06-23 LAB — URINE CULTURE
MICRO NUMBER:: 16412969
Result:: NO GROWTH
SPECIMEN QUALITY:: ADEQUATE

## 2023-06-23 LAB — TSH: TSH: 1.92 u[IU]/mL (ref 0.35–5.50)

## 2023-06-23 NOTE — Addendum Note (Signed)
 Addended by: Gates Kasal C on: 06/23/2023 02:33 PM   Modules accepted: Orders

## 2023-06-24 ENCOUNTER — Encounter: Payer: Self-pay | Admitting: Family Medicine

## 2023-06-24 DIAGNOSIS — R300 Dysuria: Secondary | ICD-10-CM

## 2023-06-27 ENCOUNTER — Other Ambulatory Visit: Payer: Self-pay | Admitting: Family Medicine

## 2023-06-27 ENCOUNTER — Other Ambulatory Visit: Payer: Self-pay | Admitting: Internal Medicine

## 2023-07-01 ENCOUNTER — Encounter (HOSPITAL_COMMUNITY)

## 2023-07-11 ENCOUNTER — Other Ambulatory Visit: Payer: Self-pay | Admitting: Family Medicine

## 2023-07-11 DIAGNOSIS — R300 Dysuria: Secondary | ICD-10-CM

## 2023-07-14 ENCOUNTER — Other Ambulatory Visit (INDEPENDENT_AMBULATORY_CARE_PROVIDER_SITE_OTHER)

## 2023-07-14 ENCOUNTER — Ambulatory Visit: Payer: Self-pay | Admitting: *Deleted

## 2023-07-14 ENCOUNTER — Other Ambulatory Visit: Payer: Self-pay | Admitting: *Deleted

## 2023-07-14 DIAGNOSIS — N390 Urinary tract infection, site not specified: Secondary | ICD-10-CM

## 2023-07-14 DIAGNOSIS — R350 Frequency of micturition: Secondary | ICD-10-CM

## 2023-07-14 LAB — POCT URINALYSIS DIPSTICK
Bilirubin, UA: NEGATIVE
Blood, UA: NEGATIVE
Glucose, UA: NEGATIVE
Ketones, UA: NEGATIVE
Nitrite, UA: POSITIVE
Protein, UA: NEGATIVE
Spec Grav, UA: <=1.005 — AB (ref 1.010–1.025)
Urobilinogen, UA: 0.2 U/dL
pH, UA: 7 (ref 5.0–8.0)

## 2023-07-14 MED ORDER — SULFAMETHOXAZOLE-TRIMETHOPRIM 800-160 MG PO TABS
1.0000 | ORAL_TABLET | Freq: Two times a day (BID) | ORAL | 0 refills | Status: DC
Start: 2023-07-14 — End: 2023-09-11

## 2023-07-14 NOTE — Telephone Encounter (Signed)
 Urine culture and dip ordered by Dr. Jalene Mayor

## 2023-07-16 LAB — URINE CULTURE
MICRO NUMBER:: 16501991
SPECIMEN QUALITY:: ADEQUATE

## 2023-07-23 ENCOUNTER — Encounter: Payer: Self-pay | Admitting: Family Medicine

## 2023-07-23 MED ORDER — AMOXICILLIN 500 MG PO CAPS: 500.0000 mg | ORAL_CAPSULE | Freq: Two times a day (BID) | ORAL | 0 refills | Status: AC

## 2023-07-26 ENCOUNTER — Other Ambulatory Visit: Payer: Self-pay | Admitting: Family Medicine

## 2023-08-06 MED ORDER — HYDROCODONE-ACETAMINOPHEN 5-325 MG PO TABS: 1.0000 | ORAL_TABLET | Freq: Two times a day (BID) | ORAL | 0 refills | Status: DC | PRN

## 2023-08-06 NOTE — Addendum Note (Signed)
 Addended by: Gates Kasal C on: 08/06/2023 04:27 PM   Modules accepted: Orders

## 2023-08-14 ENCOUNTER — Other Ambulatory Visit: Payer: Self-pay | Admitting: Family Medicine

## 2023-08-14 DIAGNOSIS — G8929 Other chronic pain: Secondary | ICD-10-CM

## 2023-08-14 DIAGNOSIS — K219 Gastro-esophageal reflux disease without esophagitis: Secondary | ICD-10-CM

## 2023-08-14 DIAGNOSIS — M25512 Pain in left shoulder: Secondary | ICD-10-CM

## 2023-08-27 ENCOUNTER — Telehealth (HOSPITAL_COMMUNITY): Payer: Self-pay | Admitting: Pharmacy Technician

## 2023-08-27 NOTE — Telephone Encounter (Signed)
 Auth Submission: APPROVED Site of care: Site of care: [facility] Payer: [employer] Medicare Medication & CPT/J Code(s) submitted: Prolia  (Denosumab ) [id number] Diagnosis Code: M81.0 Route of submission (phone, fax, portal):  Phone # Fax # Auth type: Buy/Bill HB Units/visits requested: 60mg  x 2 doses Reference number: [id number] Approval from: 04/17/23 to 02/17/24 NOTE: Had facility NPI added to authorization     Auth Submission: APPROVED Site of care: Site of care: [facility] Payer: [employer] Medicare Medication & CPT/J Code(s) submitted: Orencia  (Abatacept ) [id number] Diagnosis Code: M06.09 Route of submission (phone, fax, portal):  Phone # Fax # Auth type: Buy/Bill HB Units/visits requested: 500mg  q 28 days Reference number: [id number] Approval from: 02/18/23 to 02/17/24   Dagoberto Armour, CPhT [facility] [phone]

## 2023-09-07 ENCOUNTER — Encounter: Payer: Self-pay | Admitting: Family Medicine

## 2023-09-07 ENCOUNTER — Other Ambulatory Visit: Payer: Self-pay | Admitting: Family Medicine

## 2023-09-07 DIAGNOSIS — R300 Dysuria: Secondary | ICD-10-CM

## 2023-09-07 NOTE — Telephone Encounter (Signed)
 Refill not appropriate

## 2023-09-08 ENCOUNTER — Other Ambulatory Visit: Payer: Self-pay | Admitting: Pharmacist

## 2023-09-08 ENCOUNTER — Encounter (HOSPITAL_COMMUNITY)
Admission: RE | Admit: 2023-09-08 | Discharge: 2023-09-08 | Disposition: A | Discharge status: Home / Self Care | Source: Ambulatory Visit | Attending: Rheumatology | Admitting: Rheumatology

## 2023-09-08 ENCOUNTER — Other Ambulatory Visit

## 2023-09-08 NOTE — Progress Notes (Signed)
 Discontinued IV Orencia  orders.  Patient has had recurrent UTIs and been off of treatment.  Last Orencia  infusion was 06/03/2023  Patient has been advised to follow-up with clinic for appointment . Last appointment was 01/27/2023  [name], PharmD, MPH, BCPS, CPP Clinical Pharmacist (Rheumatology and Pulmonology)

## 2023-09-09 ENCOUNTER — Encounter (HOSPITAL_COMMUNITY)

## 2023-09-09 ENCOUNTER — Other Ambulatory Visit

## 2023-09-09 ENCOUNTER — Ambulatory Visit: Payer: Self-pay

## 2023-09-09 ENCOUNTER — Other Ambulatory Visit: Payer: Self-pay | Admitting: Family Medicine

## 2023-09-09 DIAGNOSIS — N3000 Acute cystitis without hematuria: Secondary | ICD-10-CM

## 2023-09-09 DIAGNOSIS — R300 Dysuria: Secondary | ICD-10-CM | POA: Diagnosis not present

## 2023-09-09 DIAGNOSIS — N390 Urinary tract infection, site not specified: Secondary | ICD-10-CM

## 2023-09-09 MED ORDER — CEPHALEXIN 500 MG PO CAPS: 500.0000 mg | ORAL_CAPSULE | Freq: Two times a day (BID) | ORAL | 0 refills | Status: DC

## 2023-09-09 NOTE — Telephone Encounter (Signed)
 Called her back- urine is in the lab for culture She notes a little bit of nausea She notes dysuria- would like to get on keflex  asap Rx sent to [facility] for her

## 2023-09-09 NOTE — Telephone Encounter (Signed)
 Pt has a urine culture pending. Pt would like to know if she is getting an antibiotic? FYI Only or Action Required?: Action required by provider: medication refill request.  Patient was last seen in primary care on 06/22/2023 by Copland, Harlene BROCKS, MD.  Called Nurse Triage reporting Dysuria.  Symptoms began today.  Interventions attempted: OTC medications: azo.  Symptoms are: unchanged.  Triage Disposition: Call PCP Within 24 Hours  Patient/caregiver understands and will follow disposition?: No, wishes to speak with PCP Copied from CRM #[id number]. Topic: Clinical - Red Word Triage >> Sep 09, 2023  4:30 PM Jasmin G wrote: Red Word that prompted transfer to Nurse Triage: Pt was told to bring a urine specimen this morning, daughter dropped it off at clinic this morning. Pt is experiencing a painful UTI and would like to establish best course of action. Reason for Disposition  [1] Painful urination AND [2] EITHER frequency or urgency AND [3] has on-call doctor  Answer Assessment - Initial Assessment Questions 1. SEVERITY: How bad is the pain?  (e.g., Scale 1-10; mild, moderate, or severe)     Painful and frequent 2. FREQUENCY: How many times have you had painful urination today?      Started yesterday 3. PATTERN: Is pain present every time you urinate or just sometimes?      Every void 4. ONSET: When did the painful urination start?      Over night 5. FEVER: Do you have a fever? If Yes, ask: What is your temperature, how was it measured, and when did it start?     denies 6. PAST UTI: Have you had a urine infection before? If Yes, ask: When was the last time? and What happened that time?      Hx, feels same 7. CAUSE: What do you think is causing the painful urination?  (e.g., UTI, scratch, Herpes sore)     UTI 8. OTHER SYMPTOMS: Do you have any other symptoms? (e.g., blood in urine, flank pain, genital sores, urgency, vaginal discharge)     denies  Protocols used:  Urination Pain - Female-A-AH

## 2023-09-11 ENCOUNTER — Ambulatory Visit: Payer: Self-pay | Admitting: Family Medicine

## 2023-09-11 LAB — URINE CULTURE
MICRO NUMBER:: 16735482
SPECIMEN QUALITY:: ADEQUATE

## 2023-09-11 MED ORDER — AMOXICILLIN 500 MG PO CAPS: 500.0000 mg | ORAL_CAPSULE | Freq: Two times a day (BID) | ORAL | 0 refills | Status: DC

## 2023-09-11 NOTE — Addendum Note (Signed)
 Addended by: WATT RAISIN C on: 09/11/2023 01:22 PM   Modules accepted: Orders

## 2023-09-12 NOTE — Progress Notes (Deleted)
 Office Visit Note  Patient: [name]             Date of Birth: 1930-02-24           MRN: [id number]             PCP: Watt Harlene BROCKS, MD Referring: Watt Harlene BROCKS, MD Visit Date: 09/15/2023 Occupation: @GUAROCC @  Subjective:  No chief complaint on file.   History of Present Illness: [name] is a 89 y.o. female ***     Activities of Daily Living:  Patient reports morning stiffness for *** {minute/hour:19697}.   Patient {ACTIONS;DENIES/REPORTS:21021675::Denies} nocturnal pain.  Difficulty dressing/grooming: {ACTIONS;DENIES/REPORTS:21021675::Denies} Difficulty climbing stairs: {ACTIONS;DENIES/REPORTS:21021675::Denies} Difficulty getting out of chair: {ACTIONS;DENIES/REPORTS:21021675::Denies} Difficulty using hands for taps, buttons, cutlery, and/or writing: {ACTIONS;DENIES/REPORTS:21021675::Denies}  No Rheumatology ROS completed.   PMFS History:  Patient Active Problem List   Diagnosis Date Noted   Diarrhea of presumed infectious origin 08/22/2022   Colitis 08/18/2022   Acute congestive heart failure (HCC) 12/29/2021   Acute respiratory failure (HCC) 12/24/2021   Acute respiratory failure with hypoxia (HCC) 12/23/2021   Acute on chronic combined systolic and diastolic CHF (congestive heart failure) (HCC) 12/23/2021   Rectal pain 10/10/2020   Acute posthemorrhagic anemia    Rectal bleeding 06/17/2019   Grade II internal hemorrhoids    Rectal ulcer    Popliteal artery occlusion, 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/10/2018   HTN (hypertension) 04/10/2018   Femoral artery pseudo-aneurysm, 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/09/2018   Anemia of chronic disease 02/24/2018   Unstable right hip arthroplasty 01/24/2018   History of revision of total replacement of right hip joint 01/24/2018   Hypovolemic shock (HCC)    Hyperkalemia    Hyponatremia    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   Status post revision of total hip 11/27/2017   Elevated cholesterol 10/11/2015    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HCC) 10/04/2014   Bilateral leg edema 08/01/2014   Elevated BP 08/01/2014   Rheumatoid arthritis involving multiple joints (HCC) 05/30/2014   Status post total replacement of right hip 04/28/2014   Long-term use of high-risk medication 11/11/2013   Symptomatic anemia 11/11/2013   Neuropathic pain 07/07/2013   Constipation due to pain medication 07/07/2013   Protein-calorie malnutrition, severe (HCC) 06/08/2013   Lung cancer, Right upper lobe 05/08/2013   Sciatica of right side 08/13/2011   Osteoporosis 03/07/2010   PARESTHESIA 03/07/2010   Low back pain 03/16/2009   CT, CHEST, ABNORMAL 12/18/2008   ABNORMAL ECHOCARDIOGRAM 12/14/2008   SJOGREN'S SYNDROME 11/29/2008   HYPOGLYCEMIA 06/29/2006   RAYNAUD'S DISEASE 06/29/2006    Past Medical History:  Diagnosis Date   Arthralgia of multiple joints    followed by dr ziolkowska   Arthritis    C. difficile colitis 08/2022   Cardiomyopathy (HCC)    Chronic constipation    Chronic inflammatory arthritis    rhemotolgist-  dr a. ziolkowska ([facility])   Dry eyes    eye drops used    GERD (gastroesophageal reflux disease)    H/O discoid lupus erythematosus    Hiatal hernia    History of colon polyps    Hypothyroidism    Iron deficiency anemia    LBBB (left bundle branch block) 2010   Mitchell's disease (erythromelalgia) [facility])    neurologist-  dr patel   Nocturia    Non-small cell cancer of right lung [facility]) surgeon-- dr gerhardt/  oncologist-  dr sherrod--- per lov notes no recurrence/   11-18-2017 per pt denies any symptoms  dx 2015--  Stage IIA (T2b,N0,M0) , +EGFR  mutation in exon 21, non-small cell adenocarcinoma right upper lobe---  s/p  Right upper lobectomy , right middley wedge resection and node dissection---  no chemo or radiation therapy   OA (osteoarthritis)    hands   Osteoporosis    PONV (postoperative nausea and vomiting)    likes phenergan    Raynaud's phenomenon 1965   Renal  insufficiency    Rheumatoid arthritis (HCC)    Sciatica    Scoliosis    Sjogren's syndrome (HCC)     Family History  Problem Relation Age of Onset   Other Mother 66       MVA   Coronary artery disease Father    Colon cancer Father    Diabetes Father    Cancer Father        colon   Healthy Sister    Pneumonia Sister    Healthy Brother    Other Brother        killed in war   Healthy Daughter    Hypothyroidism Daughter    Healthy Daughter    Esophageal cancer Neg Hx    Kidney disease Neg Hx    Liver disease Neg Hx    Past Surgical History:  Procedure Laterality Date   ANTERIOR HIP REVISION Right 11/27/2017   Procedure: RIGHT HIP ACETABULAR REVISION;  Surgeon: Vernetta Lonni GRADE, MD;  Location: [facility];  Service: Orthopedics;  Laterality: Right;   ANTERIOR HIP REVISION Right 01/24/2018   Procedure: OPEN REDUCTION OF DISLOCATED ANTERIOR HIP WITH REVISION OF LINER AND HIP BALL;  Surgeon: Vernetta Lonni GRADE, MD;  [facility]: [facility];  Service: Orthopedics;  Laterality: Right;   APPENDECTOMY  1950s   BIOPSY  04/14/2018   Procedure: BIOPSY;  Surgeon: Leigh Elspeth SQUIBB, MD;  Location: [facility] ENDOSCOPY;  Service: Gastroenterology;;   BIOPSY  04/16/2018   Procedure: BIOPSY;  Surgeon: Wilhelmenia Aloha Raddle., MD;  Location: [facility] ENDOSCOPY;  Service: Gastroenterology;;   CARDIOVASCULAR STRESS TEST  12/2008    mild fixed basal to mid septal perfusion defect felt likely due to artifact from LBBB, no ischemia, EF 58%   COLONOSCOPY     COLONOSCOPY WITH PROPOFOL  N/A 04/16/2018   Procedure: COLONOSCOPY WITH PROPOFOL ;  Surgeon: Wilhelmenia Aloha Raddle., MD;  Location: [facility] ENDOSCOPY;  Service: Gastroenterology;  Laterality: N/A;   ESOPHAGOGASTRODUODENOSCOPY (EGD) WITH PROPOFOL  N/A 04/14/2018   Procedure: ESOPHAGOGASTRODUODENOSCOPY (EGD) WITH PROPOFOL ;  Surgeon: Leigh Elspeth SQUIBB, MD;  Location: [facility] ENDOSCOPY;  Service: Gastroenterology;  Laterality: N/A;   FEMORAL-POPLITEAL BYPASS GRAFT Right  04/10/2018   Procedure: REPAIR RIGHT FEMORAL ARTERY PSEUDOANEURYSM, RETROPERITONEAL EXPOSURE OF ILIAC ARTERY, RIGHT POPLITEAL EMBOLECTOMY;  Surgeon: Eliza Lonni RAMAN, MD;  Location: [facility] OR;  Service: Vascular;  Laterality: Right;   FLEXIBLE SIGMOIDOSCOPY N/A 06/17/2019   Procedure: FLEXIBLE SIGMOIDOSCOPY;  Surgeon: San Sandor GAILS, DO;  [facility] ENDOSCOPY;  Service: Gastroenterology;  Laterality: N/A;   HEMOSTASIS CLIP PLACEMENT  06/17/2019   Procedure: HEMOSTASIS CLIP PLACEMENT;  Surgeon: San Sandor GAILS, DO;  [facility] ENDOS[facility];  Service: Gastroenterology;;   LYMPH NODE DISSECTION Right 06/07/2013   Procedure: LYMPH NODE DISSECTION;  Surgeon: Dallas KATHEE Jude, MD;  Location[facility] OR;  Service: Thoracic;  Laterality: Right;   PATCH ANGIOPLASTY Right 04/10/2018   Procedure: PATCH  ANGIOPLASTY OF RIGHT FEMORAL ARTERY USING BOVINE PATCH, PATCH ANGIOPLASTY OF RIGHT POPLITEAL ARTERY USING BOVINE PATCH;  Surgeon: Eliza Lonni RAMAN, MD;  Location: [facility] OR;  Service: Vascular;  Laterality: Right;  SCHLEROTHERAPY  06/17/2019   Procedure: SCHLEROTHERAPY OF VARICES;  Surgeon: San Sandor GAILS, DO;  [facility]OS[facility];  Service: Gastroenterology;;   THORACIC SYMPATHETECTOMY  1965   large incision from chest to up to shoulder, the nerves were tied together, for raynaud's   THORACOTOMY  06/07/2013   Procedure: MINI/LIMITED THORACOTOMY; right middle lobe wedge resection;  Surgeon: Dallas KATHEE Jude, MD;  Location[facility] OR;  Service: Thoracic;;   TONSILLECTOMY  child   TOTAL ABDOMINAL HYSTERECTOMY  1980's    W/ BSO   TOTAL HIP ARTHROPLASTY Right 04/28/2014   Procedure: RIGHT TOTAL HIP ARTHROPLASTY ANTERIOR APPROACH;  Surgeon: Lonni CINDERELLA Poli, MD;  [facility];  Service: Orthopedics;  Laterality: Right;   TRANSTHORACIC ECHOCARDIOGRAM  12/11/2008   ef 45-50%, grade 1 diastolic dysfunction/  mild LAE/  mild AR and MR/  trivial TR   VIDEO ASSISTED THORACOSCOPY (VATS)/WEDGE  RESECTION Right 06/07/2013   Procedure: VIDEO ASSISTED THORACOSCOPY (VATS)/right upper lobectomy, On Q;  Surgeon: Dallas KATHEE Jude, MD;  Location: [facility] OR;  Service: Thoracic;  Laterality: Right;   VIDEO BRONCHOSCOPY N/A 06/07/2013   Procedure: VIDEO BRONCHOSCOPY;  Surgeon: Dallas KATHEE Jude, MD;  Location[facility] OR;  Service: Thoracic;  Laterality: N/A;   VIDEO BRONCHOSCOPY WITH ENDOBRONCHIAL NAVIGATION N/A 05/04/2013   Procedure: VIDEO BRONCHOSCOPY WITH ENDOBRONCHIAL NAVIGATION;  Surgeon: Dallas KATHEE Jude, MD;  Location: [facility] OR;  Service: Thoracic;  Laterality: N/A;   Social History   Social History Narrative   Lives with husband, daughter and grandchild local.   Highest level of education:  masters in education admin and Engineer, technical sales History  Administered Date(s) Administered   Fluad Quad(high Dose 65+) 11/03/2018, 12/14/2019   Fluad Trivalent(High Dose 65+) 01/21/2023   Fluzone Influenza virus vaccine,trivalent (IIV3), split virus 11/14/2015   Influenza Split 11/21/2011   Influenza Whole 11/29/2007, 11/29/2008, 11/29/2009   Influenza, High Dose Seasonal PF 10/30/2012, 01/02/2015, 11/13/2016, 11/12/2017   Influenza,inj,Quad PF,6+ Mos 11/22/2013, 11/14/2015   Influenza-Unspecified 11/13/2016, 11/12/2017, 12/14/2020   PFIZER(Purple Top)SARS-COV-2 Vaccination 04/12/2019, 05/03/2019, 01/20/2020, 12/14/2020   Pneumococcal Conjugate-13 05/22/2015   Pneumococcal Polysaccharide-23 06/13/2013   Tdap 08/17/2017   Zoster, Live 01/27/2014     Objective: Vital Signs: There were no vitals taken for this visit.   Physical Exam   Musculoskeletal Exam: ***  CDAI Exam: CDAI Score: -- Patient Global: --; Provider Global: -- Swollen: --; Tender: -- Joint Exam 09/15/2023   No joint exam has been documented for this visit   There is currently no information documented on the homunculus. Go to the Rheumatology activity and complete the homunculus joint exam.  Investigation: No  additional findings.  Imaging: No results found.  Recent Labs: Lab Results  Component Value Date   WBC 5.0 06/22/2023   HGB 12.2 06/22/2023   PLT 216.0 06/22/2023   NA 130 (L) 06/22/2023   K 5.0 06/22/2023   CL 93 (L) 06/22/2023   CO2 28 06/22/2023   GLUCOSE 96 06/22/2023   BUN 27 (H) 06/22/2023   CREATININE 1.13 06/22/2023   BILITOT 0.3 06/22/2023   ALKPHOS 34 (L) 06/22/2023   AST 25 06/22/2023   ALT 13 06/22/2023   PROT 7.5 06/22/2023   ALBUMIN  4.5 06/22/2023   CALCIUM  9.6 06/22/2023   GFRAA 60 05/21/2020   QFTBGOLDPLUS Negative 10/01/2022    Speciality Comments: TX (elevated LFTs) and leflunomide  (hair loss) Plaquenil  (had macular changes)  Orencia  - 7/21 Prolia  - 04/22/19, 11/09/19, 06/06/20,12/20/20  Fu q 3 months  Procedures:  No procedures performed Allergies: Amlodipine , Prochlorperazine edisylate, Aspirin , Cymbalta [duloxetine hcl], and Pamelor  [nortriptyline  hcl]   Assessment / Plan:     Visit Diagnoses: No diagnosis found.  Orders: No orders of the defined types were placed in this encounter.  No orders of the defined types were placed in this encounter.   Face-to-face time spent with patient was *** minutes. Greater than 50% of time was spent in counseling and coordination of care.  Follow-Up Instructions: No follow-ups on file.   Maya Nash, MD  Note - This record has been created using Animal nutritionist.  Chart creation errors have been sought, but may not always  have been located. Such creation errors do not reflect on  the standard of medical care.

## 2023-09-15 ENCOUNTER — Ambulatory Visit: Admitting: Rheumatology

## 2023-09-15 DIAGNOSIS — M4125 Other idiopathic scoliosis, thoracolumbar region: Secondary | ICD-10-CM

## 2023-09-15 DIAGNOSIS — M3500 Sicca syndrome, unspecified: Secondary | ICD-10-CM

## 2023-09-15 DIAGNOSIS — E270 Other adrenocortical overactivity: Secondary | ICD-10-CM

## 2023-09-15 DIAGNOSIS — D638 Anemia in other chronic diseases classified elsewhere: Secondary | ICD-10-CM

## 2023-09-15 DIAGNOSIS — I10 Essential (primary) hypertension: Secondary | ICD-10-CM

## 2023-09-15 DIAGNOSIS — E559 Vitamin D deficiency, unspecified: Secondary | ICD-10-CM

## 2023-09-15 DIAGNOSIS — I7300 Raynaud's syndrome without gangrene: Secondary | ICD-10-CM

## 2023-09-15 DIAGNOSIS — Z96641 Presence of right artificial hip joint: Secondary | ICD-10-CM

## 2023-09-15 DIAGNOSIS — M65331 Trigger finger, right middle finger: Secondary | ICD-10-CM

## 2023-09-15 DIAGNOSIS — M65341 Trigger finger, right ring finger: Secondary | ICD-10-CM

## 2023-09-15 DIAGNOSIS — L659 Nonscarring hair loss, unspecified: Secondary | ICD-10-CM

## 2023-09-15 DIAGNOSIS — E7800 Pure hypercholesterolemia, unspecified: Secondary | ICD-10-CM

## 2023-09-15 DIAGNOSIS — Z87440 Personal history of urinary (tract) infections: Secondary | ICD-10-CM

## 2023-09-15 DIAGNOSIS — C3411 Malignant neoplasm of upper lobe, right bronchus or lung: Secondary | ICD-10-CM

## 2023-09-15 DIAGNOSIS — Z79899 Other long term (current) drug therapy: Secondary | ICD-10-CM

## 2023-09-15 DIAGNOSIS — M0609 Rheumatoid arthritis without rheumatoid factor, multiple sites: Secondary | ICD-10-CM

## 2023-09-15 DIAGNOSIS — M810 Age-related osteoporosis without current pathological fracture: Secondary | ICD-10-CM

## 2023-09-22 NOTE — Progress Notes (Unsigned)
 Office Visit Note  Patient: [name]             Date of Birth: 1930/09/19           MRN: [id number]             PCP: Watt Harlene BROCKS, MD Referring: Watt Harlene BROCKS, MD Visit Date: 09/24/2023 Occupation: @GUAROCC @  Subjective:  No chief complaint on file.   History of Present Illness: [name] is a 89 y.o. female ***     Activities of Daily Living:  Patient reports morning stiffness for *** {minute/hour:19697}.   Patient {ACTIONS;DENIES/REPORTS:21021675::Denies} nocturnal pain.  Difficulty dressing/grooming: {ACTIONS;DENIES/REPORTS:21021675::Denies} Difficulty climbing stairs: {ACTIONS;DENIES/REPORTS:21021675::Denies} Difficulty getting out of chair: {ACTIONS;DENIES/REPORTS:21021675::Denies} Difficulty using hands for taps, buttons, cutlery, and/or writing: {ACTIONS;DENIES/REPORTS:21021675::Denies}  No Rheumatology ROS completed.   PMFS History:  Patient Active Problem List   Diagnosis Date Noted   Diarrhea of presumed infectious origin 08/22/2022   Colitis 08/18/2022   Acute congestive heart failure (HCC) 12/29/2021   Acute respiratory failure (HCC) 12/24/2021   Acute respiratory failure with hypoxia (HCC) 12/23/2021   Acute on chronic combined systolic and diastolic CHF (congestive heart failure) (HCC) 12/23/2021   Rectal pain 10/10/2020   Acute posthemorrhagic anemia    Rectal bleeding 06/17/2019   Grade II internal hemorrhoids    Rectal ulcer    Popliteal artery occlusion, 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/10/2018   HTN (hypertension) 04/10/2018   Femoral artery pseudo-aneurysm, 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/09/2018   Anemia of chronic disease 02/24/2018   Unstable right hip arthroplasty 01/24/2018   History of revision of total replacement of right hip joint 01/24/2018   Hypovolemic shock (HCC)    Hyperkalemia    Hyponatremia    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   Status post revision of total hip 11/27/2017   Elevated cholesterol 10/11/2015    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HCC) 10/04/2014   Bilateral leg edema 08/01/2014   Elevated BP 08/01/2014   Rheumatoid arthritis involving multiple joints (HCC) 05/30/2014   Status post total replacement of right hip 04/28/2014   Long-term use of high-risk medication 11/11/2013   Symptomatic anemia 11/11/2013   Neuropathic pain 07/07/2013   Constipation due to pain medication 07/07/2013   Protein-calorie malnutrition, severe (HCC) 06/08/2013   Lung cancer, Right upper lobe 05/08/2013   Sciatica of right side 08/13/2011   Osteoporosis 03/07/2010   PARESTHESIA 03/07/2010   Low back pain 03/16/2009   CT, CHEST, ABNORMAL 12/18/2008   ABNORMAL ECHOCARDIOGRAM 12/14/2008   SJOGREN'S SYNDROME 11/29/2008   HYPOGLYCEMIA 06/29/2006   RAYNAUD'S DISEASE 06/29/2006    Past Medical History:  Diagnosis Date   Arthralgia of multiple joints    followed by dr ziolkowska   Arthritis    C. difficile colitis 08/2022   Cardiomyopathy (HCC)    Chronic constipation    Chronic inflammatory arthritis    rhemotolgist-  dr a. ziolkowska ([facility])   Dry eyes    eye drops used    GERD (gastroesophageal reflux disease)    H/O discoid lupus erythematosus    Hiatal hernia    History of colon polyps    Hypothyroidism    Iron deficiency anemia    LBBB (left bundle branch block) 2010   Mitchell's disease (erythromelalgia) [facility])    neurologist-  dr patel   Nocturia    Non-small cell cancer of right lung [facility]) surgeon-- dr gerhardt/  oncologist-  dr sherrod--- per lov notes no recurrence/   11-18-2017 per pt denies any symptoms  dx 2015--  Stage IIA (T2b,N0,M0) , +EGFR  mutation in exon 21, non-small cell adenocarcinoma right upper lobe---  s/p  Right upper lobectomy , right middley wedge resection and node dissection---  no chemo or radiation therapy   OA (osteoarthritis)    hands   Osteoporosis    PONV (postoperative nausea and vomiting)    likes phenergan    Raynaud's phenomenon 1965   Renal  insufficiency    Rheumatoid arthritis (HCC)    Sciatica    Scoliosis    Sjogren's syndrome (HCC)     Family History  Problem Relation Age of Onset   Other Mother 63       MVA   Coronary artery disease Father    Colon cancer Father    Diabetes Father    Cancer Father        colon   Healthy Sister    Pneumonia Sister    Healthy Brother    Other Brother        killed in war   Healthy Daughter    Hypothyroidism Daughter    Healthy Daughter    Esophageal cancer Neg Hx    Kidney disease Neg Hx    Liver disease Neg Hx    Past Surgical History:  Procedure Laterality Date   ANTERIOR HIP REVISION Right 11/27/2017   Procedure: RIGHT HIP ACETABULAR REVISION;  Surgeon: Vernetta Lonni GRADE, MD;  [facility]: [facility];  Service: Orthopedics;  Laterality: Right;   ANTERIOR HIP REVISION Right 01/24/2018   Procedure: OPEN REDUCTION OF DISLOCATED ANTERIOR HIP WITH REVISION OF LINER AND HIP BALL;  Surgeon: Vernetta Lonni GRADE, MD;  Location: [facility];  Service: Orthopedics;  Laterality: Right;   APPENDECTOMY  1950s   BIOPSY  04/14/2018   Procedure: BIOPSY;  Surgeon: Leigh Elspeth SQUIBB, MD;  Location: [facility] ENDOSCOPY;  Service: Gastroenterology;;   BIOPSY  04/16/2018   Procedure: BIOPSY;  Surgeon: Wilhelmenia Aloha Raddle., MD;  Location: [facility] ENDOSCOPY;  Service: Gastroenterology;;   CARDIOVASCULAR STRESS TEST  12/2008    mild fixed basal to mid septal perfusion defect felt likely due to artifact from LBBB, no ischemia, EF 58%   COLONOSCOPY     COLONOSCOPY WITH PROPOFOL  N/A 04/16/2018   Procedure: COLONOSCOPY WITH PROPOFOL ;  Surgeon: Wilhelmenia Aloha Raddle., MD;  Location: [facility] ENDOSCOPY;  Service: Gastroenterology;  Laterality: N/A;   ESOPHAGOGASTRODUODENOSCOPY (EGD) WITH PROPOFOL  N/A 04/14/2018   Procedure: ESOPHAGOGASTRODUODENOSCOPY (EGD) WITH PROPOFOL ;  Surgeon: Leigh Elspeth SQUIBB, MD;  Location: [facility] ENDOSCOPY;  Service: Gastroenterology;  Laterality: N/A;   FEMORAL-POPLITEAL BYPASS GRAFT Right  04/10/2018   Procedure: REPAIR RIGHT FEMORAL ARTERY PSEUDOANEURYSM, RETROPERITONEAL EXPOSURE OF ILIAC ARTERY, RIGHT POPLITEAL EMBOLECTOMY;  Surgeon: Eliza Lonni RAMAN, MD;  Location: [facility] OR;  Service: Vascular;  Laterality: Right;   FLEXIBLE SIGMOIDOSCOPY N/A 06/17/2019   Procedure: FLEXIBLE SIGMOIDOSCOPY;  Surgeon: San Sandor GAILS, DO;  [facility] ENDOS[facility];  Service: Gastroenterology;  Laterality: N/A;   HEMOSTASIS CLIP PLACEMENT  06/17/2019   Procedure: HEMOSTASIS CLIP PLACEMENT;  Surgeon: San Sandor GAILS, DO;  Location[facility] ENDOSCOPY;  Service: Gastroenterology;;   LYMPH NODE DISSECTION Right 06/07/2013   Procedure: LYMPH NODE DISSECTION;  Surgeon: Dallas KATHEE Jude, MD;  Location: [facility] OR;  Service: Thoracic;  Laterality: Right;   PATCH ANGIOPLASTY Right 04/10/2018   Procedure: PATCH  ANGIOPLASTY OF RIGHT FEMORAL ARTERY USING BOVINE PATCH, PATCH ANGIOPLASTY OF RIGHT POPLITEAL ARTERY USING BOVINE PATCH;  Surgeon: Eliza Lonni RAMAN, MD;  Location: [facility] OR;  Service: Vascular;  Laterality: Right;  SCHLEROTHERAPY  06/17/2019   Procedure: SCHLEROTHERAPY OF VARICES;  Surgeon: San Sandor GAILS, DO;  Location[facility] ENDOSCOPY;  Service: Gastroenterology;;   THORACIC SYMPATHETECTOMY  1965   large incision from chest to up to shoulder, the nerves were tied together, for raynaud's   THORACOTOMY  06/07/2013   Procedure: MINI/LIMITED THORACOTOMY; right middle lobe wedge resection;  Surgeon: Dallas KATHEE Jude, MD;  Location: [facility] OR;  Service: Thoracic;;   TONSILLECTOMY  child   TOTAL ABDOMINAL HYSTERECTOMY  1980's    W/ BSO   TOTAL HIP ARTHROPLASTY Right 04/28/2014   Procedure: RIGHT TOTAL HIP ARTHROPLASTY ANTERIOR APPROACH;  Surgeon: Lonni CINDERELLA Poli, MD;  Location: [facility];  Service: Orthopedics;  Laterality: Right;   TRANSTHORACIC ECHOCARDIOGRAM  12/11/2008   ef 45-50%, grade 1 diastolic dysfunction/  mild LAE/  mild AR and MR/  trivial TR   VIDEO ASSISTED THORACOSCOPY (VATS)/WEDGE  RESECTION Right 06/07/2013   Procedure: VIDEO ASSISTED THORACOSCOPY (VATS)/right upper lobectomy, On Q;  Surgeon: Dallas KATHEE Jude, MD;  Location: [facility] OR;  Service: Thoracic;  Laterality: Right;   VIDEO BRONCHOSCOPY N/A 06/07/2013   Procedure: VIDEO BRONCHOSCOPY;  Surgeon: Dallas KATHEE Jude, MD;  Location: [facility] OR;  Service: Thoracic;  Laterality: N/A;   VIDEO BRONCHOSCOPY WITH ENDOBRONCHIAL NAVIGATION N/A 05/04/2013   Procedure: VIDEO BRONCHOSCOPY WITH ENDOBRONCHIAL NAVIGATION;  Surgeon: Dallas KATHEE Jude, MD;  Location: [facility] OR;  Service: Thoracic;  Laterality: N/A;   Social History   Social History Narrative   Lives with husband, daughter and grandchild local.   Highest level of education:  masters in education admin and Engineer, technical sales History  Administered Date(s) Administered   Fluad Quad(high Dose 65+) 11/03/2018, 12/14/2019   Fluad Trivalent(High Dose 65+) 01/21/2023   Fluzone Influenza virus vaccine,trivalent (IIV3), split virus 11/14/2015   Influenza Split 11/21/2011   Influenza Whole 11/29/2007, 11/29/2008, 11/29/2009   Influenza, High Dose Seasonal PF 10/30/2012, 01/02/2015, 11/13/2016, 11/12/2017   Influenza,inj,Quad PF,6+ Mos 11/22/2013, 11/14/2015   Influenza-Unspecified 11/13/2016, 11/12/2017, 12/14/2020   PFIZER(Purple Top)SARS-COV-2 Vaccination 04/12/2019, 05/03/2019, 01/20/2020, 12/14/2020   Pneumococcal Conjugate-13 05/22/2015   Pneumococcal Polysaccharide-23 06/13/2013   Tdap 08/17/2017   Zoster, Live 01/27/2014     Objective: Vital Signs: There were no vitals taken for this visit.   Physical Exam   Musculoskeletal Exam: ***  CDAI Exam: CDAI Score: -- Patient Global: --; Provider Global: -- Swollen: --; Tender: -- Joint Exam 09/24/2023   No joint exam has been documented for this visit   There is currently no information documented on the homunculus. Go to the Rheumatology activity and complete the homunculus joint exam.  Investigation: No  additional findings.  Imaging: No results found.  Recent Labs: Lab Results  Component Value Date   WBC 5.0 06/22/2023   HGB 12.2 06/22/2023   PLT 216.0 06/22/2023   NA 130 (L) 06/22/2023   K 5.0 06/22/2023   CL 93 (L) 06/22/2023   CO2 28 06/22/2023   GLUCOSE 96 06/22/2023   BUN 27 (H) 06/22/2023   CREATININE 1.13 06/22/2023   BILITOT 0.3 06/22/2023   ALKPHOS 34 (L) 06/22/2023   AST 25 06/22/2023   ALT 13 06/22/2023   PROT 7.5 06/22/2023   ALBUMIN  4.5 06/22/2023   CALCIUM  9.6 06/22/2023   GFRAA 60 05/21/2020   QFTBGOLDPLUS Negative 10/01/2022    Speciality Comments: TX (elevated LFTs) and leflunomide  (hair loss) Plaquenil  (had macular changes)  Orencia  - 7/21 Prolia  - 04/22/19, 11/09/19, 06/06/20,12/20/20  Fu q 3 months  Procedures:  No procedures performed Allergies: Amlodipine , Prochlorperazine edisylate, Aspirin , Cymbalta [duloxetine hcl], and Pamelor  [nortriptyline  hcl]   Assessment / Plan:     Visit Diagnoses: Rheumatoid arthritis of multiple sites with negative rheumatoid factor (HCC)  High risk medication use  Age-related osteoporosis without current pathological fracture  Elevated cholesterol  Sicca syndrome (HCC)  Raynaud's disease without gangrene  Status post total replacement of right hip  Other idiopathic scoliosis, thoracolumbar region  History of recurrent UTIs  Malignant neoplasm of upper lobe of right lung (HCC)  Anemia of chronic disease  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HCC)  Essential hypertension  Orders: No orders of the defined types were placed in this encounter.  No orders of the defined types were placed in this encounter.   Face-to-face time spent with patient was *** minutes. Greater than 50% of time was spent in counseling and coordination of care.  Follow-Up Instructions: No follow-ups on file.   Waddell CHRISTELLA Craze, PA-C  Note - This record has been created using Dragon software.  Chart creation errors have been sought,  but may not always  have been located. Such creation errors do not reflect on  the standard of medical care.

## 2023-09-24 ENCOUNTER — Ambulatory Visit: Attending: Rheumatology | Admitting: Physician Assistant

## 2023-09-24 ENCOUNTER — Encounter: Payer: Self-pay | Admitting: Physician Assistant

## 2023-09-24 VITALS — BP 124/75 | HR 80 | Resp 14 | Ht 60.0 in | Wt 111.0 lb

## 2023-09-24 DIAGNOSIS — Z96641 Presence of right artificial hip joint: Secondary | ICD-10-CM | POA: Insufficient documentation

## 2023-09-24 DIAGNOSIS — R350 Frequency of micturition: Secondary | ICD-10-CM | POA: Diagnosis not present

## 2023-09-24 DIAGNOSIS — M0609 Rheumatoid arthritis without rheumatoid factor, multiple sites: Secondary | ICD-10-CM | POA: Diagnosis not present

## 2023-09-24 DIAGNOSIS — E7800 Pure hypercholesterolemia, unspecified: Secondary | ICD-10-CM | POA: Insufficient documentation

## 2023-09-24 DIAGNOSIS — Z79899 Other long term (current) drug therapy: Secondary | ICD-10-CM | POA: Diagnosis not present

## 2023-09-24 DIAGNOSIS — M4125 Other idiopathic scoliosis, thoracolumbar region: Secondary | ICD-10-CM | POA: Insufficient documentation

## 2023-09-24 DIAGNOSIS — E270 Other adrenocortical overactivity: Secondary | ICD-10-CM | POA: Insufficient documentation

## 2023-09-24 DIAGNOSIS — D638 Anemia in other chronic diseases classified elsewhere: Secondary | ICD-10-CM | POA: Insufficient documentation

## 2023-09-24 DIAGNOSIS — C3411 Malignant neoplasm of upper lobe, right bronchus or lung: Secondary | ICD-10-CM | POA: Insufficient documentation

## 2023-09-24 DIAGNOSIS — R300 Dysuria: Secondary | ICD-10-CM | POA: Diagnosis not present

## 2023-09-24 DIAGNOSIS — I7300 Raynaud's syndrome without gangrene: Secondary | ICD-10-CM | POA: Insufficient documentation

## 2023-09-24 DIAGNOSIS — M810 Age-related osteoporosis without current pathological fracture: Secondary | ICD-10-CM | POA: Insufficient documentation

## 2023-09-24 DIAGNOSIS — I10 Essential (primary) hypertension: Secondary | ICD-10-CM | POA: Insufficient documentation

## 2023-09-24 DIAGNOSIS — M3500 Sicca syndrome, unspecified: Secondary | ICD-10-CM | POA: Insufficient documentation

## 2023-09-24 DIAGNOSIS — Z87440 Personal history of urinary (tract) infections: Secondary | ICD-10-CM | POA: Diagnosis not present

## 2023-09-25 ENCOUNTER — Ambulatory Visit: Payer: Self-pay | Admitting: Physician Assistant

## 2023-09-25 ENCOUNTER — Other Ambulatory Visit: Payer: Self-pay

## 2023-09-25 LAB — URINALYSIS, ROUTINE W REFLEX MICROSCOPIC
Bacteria, UA: NONE SEEN /HPF
Bilirubin Urine: NEGATIVE
Glucose, UA: NEGATIVE
Hgb urine dipstick: NEGATIVE
Hyaline Cast: NONE SEEN /LPF
Leukocytes,Ua: NEGATIVE
Nitrite: NEGATIVE
RBC / HPF: NONE SEEN /HPF (ref 0–2)
Specific Gravity, Urine: 1.028 (ref 1.001–1.035)
Squamous Epithelial / HPF: NONE SEEN /HPF (ref <=5)
WBC, UA: NONE SEEN /HPF (ref 0–5)
pH: <=5 — AB (ref 5.0–8.0)

## 2023-09-25 LAB — URINE CULTURE
MICRO NUMBER:: 16801740
Result:: NO GROWTH
SPECIMEN QUALITY:: ADEQUATE

## 2023-09-25 NOTE — Progress Notes (Signed)
 UA revealed trace protein and ketones.  No bacteria, nitrites, or leukocytes--good news! Culture is still pending.

## 2023-09-27 NOTE — Progress Notes (Signed)
 Urine culture negative--ok to proceed with scheduling IV orencia --plan to try spacing infusions to every 6 weeks as discussed at recent visit.

## 2023-09-28 ENCOUNTER — Other Ambulatory Visit: Payer: Self-pay | Admitting: Pharmacist

## 2023-09-28 DIAGNOSIS — Z111 Encounter for screening for respiratory tuberculosis: Secondary | ICD-10-CM

## 2023-09-28 DIAGNOSIS — M0609 Rheumatoid arthritis without rheumatoid factor, multiple sites: Secondary | ICD-10-CM

## 2023-09-28 DIAGNOSIS — E559 Vitamin D deficiency, unspecified: Secondary | ICD-10-CM

## 2023-09-28 DIAGNOSIS — Z79899 Other long term (current) drug therapy: Secondary | ICD-10-CM

## 2023-09-28 DIAGNOSIS — M069 Rheumatoid arthritis, unspecified: Secondary | ICD-10-CM

## 2023-09-28 DIAGNOSIS — Z5181 Encounter for therapeutic drug level monitoring: Secondary | ICD-10-CM

## 2023-09-28 DIAGNOSIS — M810 Age-related osteoporosis without current pathological fracture: Secondary | ICD-10-CM

## 2023-09-28 NOTE — Progress Notes (Signed)
 Next infusion of Orencia  IV (G9870) not yet scheduled. She will be changing to every 6 weeks due to recurrent infections Diagnosis: RA  Dose: 500mg  every 6 weeks  Last Clinic Visit: 09/24/2023 Next Clinic Visit: 12/24/2023  Last infusion: 06/03/23 (will not reload)  Labs: CBC and CMP on 06/22/2023 TB Gold: negative on 10/01/2022   Orders placed for Orencia  IV (J0129) x 2 doses along with premedication of acetaminophen  650mg  p.o. and diphenhydramine  25mg  p.o. to be administered 30 minutes before medication infusion.  Standing CBC with diff/platelet and CMP with GFR orders placed to be drawn every 12 weeks (every other infusion).  Next TB gold due - order placed.  [name], PharmD, MPH, BCPS, CPP Clinical Pharmacist (Rheumatology and Pulmonology)

## 2023-10-03 ENCOUNTER — Other Ambulatory Visit: Payer: Self-pay | Admitting: Family Medicine

## 2023-10-03 DIAGNOSIS — R300 Dysuria: Secondary | ICD-10-CM

## 2023-10-05 ENCOUNTER — Telehealth: Payer: Self-pay

## 2023-10-05 ENCOUNTER — Other Ambulatory Visit

## 2023-10-05 DIAGNOSIS — R300 Dysuria: Secondary | ICD-10-CM

## 2023-10-05 NOTE — Telephone Encounter (Signed)
 Copied from CRM #[id number]. Topic: General - Other >> Oct 05, 2023  9:21 AM Mesmerise C wrote: Reason for CRM: patient wanted to advise for provider to let lab know to take a urine specimen that her daughter will be sending in this morning

## 2023-10-06 ENCOUNTER — Encounter (HOSPITAL_COMMUNITY)

## 2023-10-07 ENCOUNTER — Encounter: Payer: Self-pay | Admitting: Family Medicine

## 2023-10-07 ENCOUNTER — Inpatient Hospital Stay (HOSPITAL_COMMUNITY): Admission: RE | Admit: 2023-10-07 | Source: Ambulatory Visit

## 2023-10-07 LAB — URINE CULTURE
MICRO NUMBER:: 16845587
SPECIMEN QUALITY:: ADEQUATE

## 2023-10-14 ENCOUNTER — Other Ambulatory Visit: Payer: Self-pay | Admitting: Family Medicine

## 2023-10-14 NOTE — Progress Notes (Signed)
 HPI: FU CHF. Nuclear study 2010 showed ejection fraction 58% with fixed septal defect related to left bundle branch block and no ischemia.  Echocardiogram February 2020 showed normal LV systolic function grade 1 diastolic dysfunction, mild left atrial enlargement, mild aortic insufficiency.  Monitor April 2021 showed occasional PVC and brief episode of SVT.  Patient has been diagnosed with lung cancer.  CT February 2023 showed widespread pulmonary nodules increasing in size and emphysema.  Admitted November 2023 with acute respiratory failure felt secondary to CHF.  She was diuresed with improvement and also had thoracentesis.  Noted to have severe MR previously. There was discussion with Dr. Wonda concerning potential MitraClip.  There was also note that Tagrisso  could be contributing to CHF.  She elected to discontinue Tagrisso  to see if her symptoms would improve.  CT March 2024 showed enlargement of numerous scattered solid and subsolid nodules in both lungs suspicious for low-grade adenocarcinoma. Venous Dopplers December 2023 showed no DVT.  Most recent echocardiogram July 2024 showed ejection fraction 40 to 45%, severe left ventricular enlargement, grade 1 diastolic dysfunction, moderate left atrial enlargement, moderate to severe mitral regurgitation, mild aortic insufficiency.  Since last seen, patient denies dyspnea, chest pain or syncope.  She has occasions when her blood pressure increases.  She is also having palpitations at night.  Current Outpatient Medications  Medication Sig Dispense Refill   Abatacept  (ORENCIA  IV) Inject 500 mg into the vein every 6 (six) weeks.     acetaminophen  (TYLENOL ) 500 MG tablet Take 1 tablet (500 mg total) by mouth every 6 (six) hours as needed (pain). 30 tablet 0   amoxicillin  (AMOXIL ) 500 MG capsule TAKE 1 CAPSULE BY MOUTH TWICE A DAY FOR 7 DAYS 14 capsule 0   Biotin  1000 MCG tablet Take 1,000 mcg by mouth 2 (two) times daily.     carvedilol  (COREG ) 12.5  MG tablet Take 1 tablet (12.5 mg total) by mouth 2 (two) times daily with a meal. 180 tablet 3   estradiol  (ESTRACE ) 0.1 MG/GM vaginal cream Place one gram vaginally up to three times a week as needed to maintain comfort 42.5 g 5   famotidine  (PEPCID ) 20 MG tablet TAKE 1 TABLET BY MOUTH EVERY DAY 90 tablet 3   gabapentin  (NEURONTIN ) 300 MG capsule Take 1 capsule (300 mg total) by mouth 3 (three) times daily. 270 capsule 1   HYDROcodone -acetaminophen  (NORCO/VICODIN) 5-325 MG tablet Take 1 tablet by mouth 2 (two) times daily as needed for moderate pain (pain score 4-6). 30 tablet 0   levothyroxine  (SYNTHROID ) 75 MCG tablet Take 1 tablet (75 mcg total) by mouth daily before breakfast. 90 tablet 1   losartan  (COZAAR ) 25 MG tablet TAKE 1 TABLET (25 MG TOTAL) BY MOUTH DAILY. 90 tablet 1   Multiple Vitamin (MULTIVITAMIN) tablet Take 1 tablet by mouth daily.     ofloxacin  (FLOXIN ) 0.3 % OTIC solution Place 5-10 drops into both ears daily. Use for 10 days 5 mL 0   ondansetron  (ZOFRAN ) 4 MG tablet Take 1 tablet (4 mg total) by mouth every 8 (eight) hours as needed for nausea or vomiting. 30 tablet 1   Probiotic Product (PROBIOTIC PO) Take 1 capsule by mouth daily.      traMADol  (ULTRAM ) 50 MG tablet TAKE 1/2-1 TABLET BY MOUTH EVERY 8 HOURS AS NEEDED. DO NOT COMBINE WITH OTHER PAIN MEDICATION 60 tablet 2   No current facility-administered medications for this visit.     Past Medical History:  Diagnosis  Date   Arthralgia of multiple joints    followed by dr thamas   Arthritis    C. difficile colitis 08/2022   Cardiomyopathy (HCC)    Chronic constipation    Chronic inflammatory arthritis    rhemotolgist-  dr a. ziolkowska ([facility])   Dry eyes    eye drops used    GERD (gastroesophageal reflux disease)    H/O discoid lupus erythematosus    Hiatal hernia    History of colon polyps    Hypothyroidism    Iron deficiency anemia    LBBB (left bundle branch block) 2010   Mitchell's disease  (erythromelalgia) [facility])    neurologist-  dr patel   Nocturia    Non-small cell cancer of right lung [facility]) surgeon-- dr gerhardt/  oncologist-  dr sherrod--- per lov notes no recurrence/   11-18-2017 per pt denies any symptoms   dx 2015--  Stage IIA (T2b,N0,M0) , +EGFR  mutation in exon 21, non-small cell adenocarcinoma right upper lobe---  s/p  Right upper lobectomy , right middley wedge resection and node dissection---  no chemo or radiation therapy   OA (osteoarthritis)    hands   Osteoporosis    PONV (postoperative nausea and vomiting)    likes phenergan    Raynaud's phenomenon 1965   Renal insufficiency    Rheumatoid arthritis (HCC)    Sciatica    Scoliosis    Sjogren's syndrome [facility])     Past Surgical History:  Procedure Laterality Date   ANTERIOR HIP REVISION Right 11/27/2017   Procedure: RIGHT HIP ACETABULAR REVISION;  Surgeon: Vernetta Lonni GRADE, MD;  Location: [facility] ORS;  Service: Orthopedics;  Laterality: Right;   ANTERIOR HIP REVISION Right 01/24/2018   Procedure: OPEN REDUCTION OF DISLOCATED ANTERIOR HIP WITH REVISION OF LINER AND HIP BALL;  Surgeon: Vernetta Lonni GRADE, MD;  Location: [facility];  Service: Orthopedics;  Laterality: Right;   APPENDECTOMY  1950s   BIOPSY  04/14/2018   Procedure: BIOPSY;  Surgeon: Leigh Elspeth SQUIBB, MD;  Location: [facility] ENDOSCOPY;  Service: Gastroenterology;;   BIOPSY  04/16/2018   Procedure: BIOPSY;  Surgeon: Wilhelmenia Aloha Raddle., MD;  Location: [facility] ENDOSCOPY;  Service: Gastroenterology;;   CARDIOVASCULAR STRESS TEST  12/2008    mild fixed basal to mid septal perfusion defect felt likely due to artifact from LBBB, no ischemia, EF 58%   COLONOSCOPY     COLONOSCOPY WITH PROPOFOL  N/A 04/16/2018   Procedure: COLONOSCOPY WITH PROPOFOL ;  Surgeon: Wilhelmenia Aloha Raddle., MD;  Location: [facility] ENDOSCOPY;  Service: Gastroenterology;  Laterality: N/A;   ESOPHAGOGASTRODUODENOSCOPY (EGD) WITH PROPOFOL  N/A 04/14/2018   Procedure:  ESOPHAGOGASTRODUODENOSCOPY (EGD) WITH PROPOFOL ;  Surgeon: Leigh Elspeth SQUIBB, MD;  Location: [facility] ENDOSCOPY;  Service: Gastroenterology;  Laterality: N/A;   FEMORAL-POPLITEAL BYPASS GRAFT Right 04/10/2018   Procedure: REPAIR RIGHT FEMORAL ARTERY PSEUDOANEURYSM, RETROPERITONEAL EXPOSURE OF ILIAC ARTERY, RIGHT POPLITEAL EMBOLECTOMY;  Surgeon: Eliza Lonni RAMAN, MD;  [facility] OR;  Service: Vascular;  Laterality: Right;   FLEXIBLE SIGMOIDOSCOPY N/A 06/17/2019   Procedure: FLEXIBLE SIGMOIDOSCOPY;  Surgeon: San Sandor GAILS, DO;  Location: [facility] ENDOSCOPY;  Service: Gastroenterology;  Laterality: N/A;   HEMOSTASIS CLIP PLACEMENT  06/17/2019   Procedure: HEMOSTASIS CLIP PLACEMENT;  Surgeon: San Sandor GAILS, DO;  Location: [facility] ENDOSCOPY;  Service: Gastroenterology;;   LYMPH NODE DISSECTION Right 06/07/2013   Procedure: LYMPH NODE DISSECTION;  Surgeon: Dallas KATHEE Jude, MD;  Location: [facility] OR;  Service: Thoracic;  Laterality: Right;   PATCH ANGIOPLASTY Right 04/10/2018  Procedure: PATCH  ANGIOPLASTY OF RIGHT FEMORAL ARTERY USING BOVINE PATCH, PATCH ANGIOPLASTY OF RIGHT POPLITEAL ARTERY USING BOVINE PATCH;  Surgeon: Eliza Lonni RAMAN, MD;  Location: [facility] OR;  Service: Vascular;  Laterality: Right;   SCHLEROTHERAPY  06/17/2019   Procedure: WALDEMAR OF VARICES;  Surgeon: San Sandor GAILS, DO;  Location: [facility] ENDOSCOPY;  Service: Gastroenterology;;   THORACIC SYMPATHETECTOMY  1965   large incision from chest to up to shoulder, the nerves were tied together, for raynaud's   THORACOTOMY  06/07/2013   Procedure: MINI/LIMITED THORACOTOMY; right middle lobe wedge resection;  Surgeon: Dallas KATHEE Jude, MD;  Location[facility] OR;  Service: Thoracic;;   TONSILLECTOMY  child   TOTAL ABDOMINAL HYSTERECTOMY  1980's    W/ BSO   TOTAL HIP ARTHROPLASTY Right 04/28/2014   Procedure: RIGHT TOTAL HIP ARTHROPLASTY ANTERIOR APPROACH;  Surgeon: Lonni CINDERELLA Poli, MD;  Location: [facility];  Service: Orthopedics;   Laterality: Right;   TRANSTHORACIC ECHOCARDIOGRAM  12/11/2008   ef 45-50%, grade 1 diastolic dysfunction/  mild LAE/  mild AR and MR/  trivial TR   VIDEO ASSISTED THORACOSCOPY (VATS)/WEDGE RESECTION Right 06/07/2013   Procedure: VIDEO ASSISTED THORACOSCOPY (VATS)/right upper lobectomy, On Q;  Surgeon: Dallas KATHEE Jude, MD;  Location: [facility] OR;  Service: Thoracic;  Laterality: Right;   VIDEO BRONCHOSCOPY N/A 06/07/2013   Procedure: VIDEO BRONCHOSCOPY;  Surgeon: Dallas KATHEE Jude, MD;  Location: [facility] OR;  Service: Thoracic;  Laterality: N/A;   VIDEO BRONCHOSCOPY WITH ENDOBRONCHIAL NAVIGATION N/A 05/04/2013   Procedure: VIDEO BRONCHOSCOPY WITH ENDOBRONCHIAL NAVIGATION;  Surgeon: Dallas KATHEE Jude, MD;  Location: [facility] OR;  Service: Thoracic;  Laterality: N/A;    Social History   Socioeconomic History   Marital status: Widowed    Spouse name: Not on file   Number of children: 2   Years of education: Not on file   Highest education level: Master's degree (e.g., MA, MS, MEng, MEd, MSW, MBA)  Occupational History   Occupation: n/a  Tobacco Use   Smoking status: Never    Passive exposure: Past   Smokeless tobacco: Never  Vaping Use   Vaping status: Never Used  Substance and Sexual Activity   Alcohol  use: Not Currently   Drug use: Never   Sexual activity: Not Currently    Birth control/protection: Surgical  Other Topics Concern   Not on file  Social History Narrative   Lives with husband, daughter and grandchild local.   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   Social Drivers of Health   Financial Resource Strain: Low Risk  (08/13/2022)   Overall Financial Resource Strain (CARDIA)    Difficulty of Paying Living Expenses: Not hard at all  Food Insecurity: No Food Insecurity (08/25/2022)   Hunger Vital Sign    Worried About Running Out of Food in the Last Year: Never true    Ran Out of Food in the Last Year: Never true  Transportation Needs: No Transportation  Needs (08/25/2022)   PRAPARE - Administrator, Civil Service (Medical): No    Lack of Transportation (Non-Medical): No  Physical Activity: Unknown (08/13/2022)   Exercise Vital Sign    Days of Exercise per Week: Patient declined    Minutes of Exercise per Session: Not on file  Stress: Patient Declined (08/13/2022)   [employer] of Occupational Health - Occupational Stress Questionnaire    Feeling of Stress : Patient declined  Social Connections: Moderately Integrated (08/13/2022)   Social Connection and  Isolation Panel    Frequency of Communication with Friends and Family: More than three times a week    Frequency of Social Gatherings with Friends and Family: More than three times a week    Attends Religious Services: More than 4 times per year    Active Member of [employer] or Organizations: Yes    Attends Banker Meetings: More than 4 times per year    Marital Status: Widowed  Intimate Partner Violence: Not At Risk (08/19/2022)   Humiliation, Afraid, Rape, and Kick questionnaire    Fear of Current or Ex-Partner: No    Emotionally Abused: No    Physically Abused: No    Sexually Abused: No    Family History  Problem Relation Age of Onset   Other Mother 85       MVA   Coronary artery disease Father    Colon cancer Father    Diabetes Father    Cancer Father        colon   Healthy Sister    Pneumonia Sister    Healthy Brother    Other Brother        killed in war   Healthy Daughter    Hypothyroidism Daughter    Healthy Daughter    Esophageal cancer Neg Hx    Kidney disease Neg Hx    Liver disease Neg Hx     ROS: no fevers or chills, productive cough, hemoptysis, dysphasia, odynophagia, melena, hematochezia, dysuria, hematuria, rash, seizure activity, orthopnea, PND, pedal edema, claudication. Remaining systems are negative.  Physical Exam: Well-developed frail in no acute distress.  Skin is warm and dry.  HEENT is normal.  Neck is supple.  Chest  is clear to auscultation with normal expansion.  Cardiovascular exam is regular rate and rhythm.  Abdominal exam nontender or distended. No masses palpated. Extremities show no edema. neuro grossly intact  A/P  1 cardiomyopathy-this is felt secondary to dyssynchrony.  We have treated this conservatively given age and metastatic lung cancer.  Continue carvedilol  and losartan .   2 chronic combined systolic/diastolic congestive heart failure-patient previously considered for MitraClip but given age and metastatic lung cancer I do not think this would be appropriate.  Continue conservative measures.  Previous volume excess is felt secondary to tagrisso .  No we discussed repeating her a candidate for MitraClip we have elected not to pursue further imaging would not change her manage.  3 history of palpitations-continue beta-blocker.  She is having worsening palpitations.  Discussed a smart watch to record any rhythm strips associated with her symptoms  4 moderate to severe mitral regurgitation-plan conservative measures as outlined.  5 left bundle branch block  6 metastatic lung cancer-per oncology.  Redell Shallow, MD

## 2023-10-21 NOTE — Progress Notes (Signed)
 Patient due for Prolia  on 10/26/2023. Needs updated CMP and Vitamin D . Orders placed to be drawn with next infusion  Jodey Burbano, PharmD, MPH, BCPS, CPP Clinical Pharmacist (Rheumatology and Pulmonology)

## 2023-10-21 NOTE — Addendum Note (Signed)
 Addended by: DAYNE SHERRY RAMAN on: 10/21/2023 10:04 AM   Modules accepted: Orders

## 2023-10-28 ENCOUNTER — Encounter: Payer: Self-pay | Admitting: Cardiology

## 2023-10-28 ENCOUNTER — Other Ambulatory Visit: Payer: Self-pay | Admitting: Family Medicine

## 2023-10-28 ENCOUNTER — Ambulatory Visit: Attending: Cardiology | Admitting: Cardiology

## 2023-10-28 VITALS — BP 137/78 | HR 74 | Ht 60.0 in | Wt 106.0 lb

## 2023-10-28 DIAGNOSIS — R002 Palpitations: Secondary | ICD-10-CM

## 2023-10-28 DIAGNOSIS — I340 Nonrheumatic mitral (valve) insufficiency: Secondary | ICD-10-CM

## 2023-10-28 DIAGNOSIS — I420 Dilated cardiomyopathy: Secondary | ICD-10-CM | POA: Diagnosis not present

## 2023-10-28 DIAGNOSIS — I5040 Unspecified combined systolic (congestive) and diastolic (congestive) heart failure: Secondary | ICD-10-CM | POA: Diagnosis not present

## 2023-10-28 DIAGNOSIS — R300 Dysuria: Secondary | ICD-10-CM

## 2023-10-28 MED ORDER — AMOXICILLIN 500 MG PO CAPS: 500.0000 mg | ORAL_CAPSULE | Freq: Two times a day (BID) | ORAL | 0 refills | Status: DC

## 2023-10-28 NOTE — Addendum Note (Signed)
 Addended by: WATT RAISIN C on: 10/28/2023 04:35 PM   Modules accepted: Orders

## 2023-10-28 NOTE — Patient Instructions (Signed)
   Follow-Up: At [facility], you and your health needs are our priority.  As part of our continuing mission to provide you with exceptional heart care, our providers are all part of one team.  This team includes your primary Cardiologist (physician) and Advanced Practice Providers or APPs (Physician Assistants and Nurse Practitioners) who all work together to provide you with the care you need, when you need it.  Your next appointment:   6 month(s)  Provider:   Redell Shallow, MD

## 2023-10-29 NOTE — Telephone Encounter (Unsigned)
 Copied from CRM #[id number]. Topic: Clinical - Medical Advice >> Oct 28, 2023  4:05 PM Thersia BROCKS wrote: Reason for CRM: Patient called back in wanted to let her know she does have symptoms of UTI , would like for the prescription to be sent in to the pharmacy

## 2023-11-04 ENCOUNTER — Other Ambulatory Visit: Payer: Self-pay | Admitting: Pharmacist

## 2023-11-04 ENCOUNTER — Inpatient Hospital Stay (HOSPITAL_COMMUNITY): Admission: RE | Admit: 2023-11-04 | Source: Ambulatory Visit

## 2023-11-04 NOTE — Progress Notes (Signed)
 Next Prolia  SQ due on 10/26/2023. Diagnosis: age-related osteoporosis  Dose: 60 mg SQ every 6 months  Last Clinic Visit: 09/24/2023 Next Clinic Visit: 12/24/2023  Last Prolia  dose: 04/29/2023  Orders placed for Prolia  x 1 dose. No premedicatons required.   Will follow-up to ensured scheduled and completed  [name], PharmD, MPH, BCPS, CPP Clinical Pharmacist [facility])

## 2023-11-05 ENCOUNTER — Other Ambulatory Visit (HOSPITAL_COMMUNITY): Payer: Self-pay | Admitting: Rheumatology

## 2023-11-10 ENCOUNTER — Other Ambulatory Visit: Payer: Self-pay | Admitting: Physician Assistant

## 2023-11-10 ENCOUNTER — Telehealth: Payer: Self-pay

## 2023-11-10 DIAGNOSIS — C3411 Malignant neoplasm of upper lobe, right bronchus or lung: Secondary | ICD-10-CM

## 2023-11-10 NOTE — Telephone Encounter (Signed)
 Spoke with patient in regards to scheduling CT scan.   Transferred called to Tonya in nuclear medicine to schedule the scan for patient.

## 2023-11-11 ENCOUNTER — Encounter: Payer: Self-pay | Admitting: Family Medicine

## 2023-11-11 ENCOUNTER — Other Ambulatory Visit: Payer: Self-pay

## 2023-11-11 ENCOUNTER — Ambulatory Visit: Admitting: Family Medicine

## 2023-11-11 VITALS — BP 130/72 | HR 94 | Ht 60.0 in | Wt 110.2 lb

## 2023-11-11 DIAGNOSIS — R110 Nausea: Secondary | ICD-10-CM

## 2023-11-11 DIAGNOSIS — R519 Headache, unspecified: Secondary | ICD-10-CM

## 2023-11-11 DIAGNOSIS — L989 Disorder of the skin and subcutaneous tissue, unspecified: Secondary | ICD-10-CM | POA: Diagnosis not present

## 2023-11-11 MED ORDER — VALACYCLOVIR HCL 1 G PO TABS: 1000.0000 mg | ORAL_TABLET | Freq: Three times a day (TID) | ORAL | 0 refills | Status: DC

## 2023-11-11 NOTE — Progress Notes (Signed)
 [facility] at [facility] [address], [address] [place], [place] [address] [phone]  Date:  11/11/2023   Name:  [name]   DOB:  1930-11-12   MRN:  [id number]  PCP:  Watt Raven BROCKS, MD    Chief Complaint: Hair/Scalp Problem (Scalp lesion onset 11/10/2023 )   History of Present Illness:  [name] is a 89 y.o. 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   Discussed the use of AI scribe software for clinical note transcription with the patient, who gave verbal consent to proceed.  History of Present Illness [name] is a 89 year old female who presents with a painful lesion on her scalp.  She noticed a painful lesion on her scalp yesterday, described as throbbing and tender to touch, located on the right side of her head. There is no tenderness on her neck or scalp beyond the lesion.  No fever or other systemic symptoms. She otherwise feels just fine  She has a history of rheumatoid arthritis and is currently taking Tylenol  for pain management. She avoids NSAIDs due to kidney concerns and uses Tramadol  as needed. No vision changes or pain in or around the eye    Patient Active Problem List   Diagnosis Date Noted   Diarrhea of presumed infectious origin 08/22/2022   Colitis 08/18/2022   Acute congestive heart failure (HCC) 12/29/2021   Acute respiratory failure (HCC) 12/24/2021   Acute respiratory failure with hypoxia (HCC) 12/23/2021   Acute on chronic combined systolic and diastolic CHF (congestive heart failure) (HCC) 12/23/2021   Rectal pain 10/10/2020   Acute posthemorrhagic anemia     Rectal bleeding 06/17/2019   Grade II internal hemorrhoids    Rectal ulcer    Popliteal artery occlusion, right 04/10/2018   HTN (hypertension) 04/10/2018   Femoral artery pseudo-aneurysm, right 04/09/2018   Anemia of chronic disease 02/24/2018   Unstable right hip arthroplasty 01/24/2018   History of revision of total replacement of right hip joint 01/24/2018   Hypovolemic shock (HCC)    Hyperkalemia    Hyponatremia    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   Status post revision of total hip 11/27/2017   Elevated cholesterol 10/11/2015   Adrenal gland hyperfunction 10/04/2014   Bilateral leg edema 08/01/2014   Elevated BP 08/01/2014   Rheumatoid arthritis involving multiple joints (HCC) 05/30/2014   Status post total replacement of right hip 04/28/2014   Long-term use of high-risk medication 11/11/2013   Symptomatic anemia 11/11/2013   Neuropathic pain 07/07/2013   Constipation due to pain medication 07/07/2013   Protein-calorie malnutrition, severe (HCC) 06/08/2013   Lung cancer, Right upper lobe 05/08/2013   Sciatica of right side 08/13/2011   Osteoporosis 03/07/2010   PARESTHESIA 03/07/2010   Low back pain 03/16/2009   CT, CHEST, ABNORMAL 12/18/2008   ABNORMAL ECHOCARDIOGRAM 12/14/2008   SJOGREN'S SYNDROME 11/29/2008   HYPOGLYCEMIA 06/29/2006   RAYNAUD'S DISEASE 06/29/2006    Past Medical History:  Diagnosis Date   Arthralgia of multiple joints    followed by dr thamas   Arthritis    C. difficile colitis 08/2022   Cardiomyopathy (HCC)    Chronic constipation  Chronic inflammatory arthritis    rhemotolgist-  dr a. ziolkowska ([facility])   Dry eyes    eye drops used    GERD (gastroesophageal reflux disease)    H/O discoid lupus erythematosus    Hiatal hernia    History of colon polyps    Hypothyroidism    Iron deficiency anemia    LBBB (left bundle branch block) 2010   Mitchell's disease (erythromelalgia)    neurologist-  dr patel   Nocturia     Non-small cell cancer of right lung [facility]) surgeon-- dr gerhardt/  oncologist-  dr sherrod--- per lov notes no recurrence/   11-18-2017 per pt denies any symptoms   dx 2015--  Stage IIA (T2b,N0,M0) , +EGFR  mutation in exon 21, non-small cell adenocarcinoma right upper lobe---  s/p  Right upper lobectomy , right middley wedge resection and node dissection---  no chemo or radiation therapy   OA (osteoarthritis)    hands   Osteoporosis    PONV (postoperative nausea and vomiting)    likes phenergan    Raynaud's phenomenon 1965   Renal insufficiency    Rheumatoid arthritis (HCC)    Sciatica    Scoliosis    Sjogren's syndrome     Past Surgical History:  Procedure Laterality Date   ANTERIOR HIP REVISION Right 11/27/2017   Procedure: RIGHT HIP ACETABULAR REVISION;  Surgeon: Vernetta Lonni GRADE, MD;  Location: [facility] ORS;  Service: Orthopedics;  Laterality: Right;   ANTERIOR HIP REVISION Right 01/24/2018   Procedure: OPEN REDUCTION OF DISLOCATED ANTERIOR HIP WITH REVISION OF LINER AND HIP BALL;  Surgeon: Vernetta Lonni GRADE, MD;  Location: [facility];  Service: Orthopedics;  Laterality: Right;   APPENDECTOMY  1950s   BIOPSY  04/14/2018   Procedure: BIOPSY;  Surgeon: Leigh Elspeth SQUIBB, MD;  Location: [facility] ENDOSCOPY;  Service: Gastroenterology;;   BIOPSY  04/16/2018   Procedure: BIOPSY;  Surgeon: Wilhelmenia Aloha Raddle., MD;  Location: [facility] ENDOSCOPY;  Service: Gastroenterology;;   CARDIOVASCULAR STRESS TEST  12/2008    mild fixed basal to mid septal perfusion defect felt likely due to artifact from LBBB, no ischemia, EF 58%   COLONOSCOPY     COLONOSCOPY WITH PROPOFOL  N/A 04/16/2018   Procedure: COLONOSCOPY WITH PROPOFOL ;  Surgeon: Wilhelmenia Aloha Raddle., MD;  Location: [facility] ENDOSCOPY;  Service: Gastroenterology;  Laterality: N/A;   ESOPHAGOGASTRODUODENOSCOPY (EGD) WITH PROPOFOL  N/A 04/14/2018   Procedure: ESOPHAGOGASTRODUODENOSCOPY (EGD) WITH PROPOFOL ;  Surgeon: Leigh Elspeth SQUIBB, MD;  Location:  [facility] ENDOSCOPY;  Service: Gastroenterology;  Laterality: N/A;   FEMORAL-POPLITEAL BYPASS GRAFT Right 04/10/2018   Procedure: REPAIR RIGHT FEMORAL ARTERY PSEUDOANEURYSM, RETROPERITONEAL EXPOSURE OF ILIAC ARTERY, RIGHT POPLITEAL EMBOLECTOMY;  Surgeon: Eliza Lonni RAMAN, MD;  Location: [facility] OR;  Service: Vascular;  Laterality: Right;   FLEXIBLE SIGMOIDOSCOPY N/A 06/17/2019   Procedure: FLEXIBLE SIGMOIDOSCOPY;  Surgeon: San Sandor GAILS, DO;  Location: [facility] ENDOSCOPY;  Service: Gastroenterology;  Laterality: N/A;   HEMOSTASIS CLIP PLACEMENT  06/17/2019   Procedure: HEMOSTASIS CLIP PLACEMENT;  Surgeon: San Sandor GAILS, DO;  Location: [facility] ENDOSCOPY;  Service: Gastroenterology;;   LYMPH NODE DISSECTION Right 06/07/2013   Procedure: LYMPH NODE DISSECTION;  Surgeon: Dallas KATHEE Jude, MD;  Location: [facility] OR;  Service: Thoracic;  Laterality: Right;   PATCH ANGIOPLASTY Right 04/10/2018   Procedure: PATCH  ANGIOPLASTY OF RIGHT FEMORAL ARTERY USING BOVINE PATCH, PATCH ANGIOPLASTY OF RIGHT POPLITEAL ARTERY USING BOVINE PATCH;  Surgeon: Eliza Lonni RAMAN, MD;  Location: [facility] OR;  Service: Vascular;  Laterality: Right;  SCHLEROTHERAPY  06/17/2019   Procedure: SCHLEROTHERAPY OF VARICES;  Surgeon: San Sandor GAILS, DO;  Location: [facility] ENDOSCOPY;  Service: Gastroenterology;;   THORACIC SYMPATHETECTOMY  1965   large incision from chest to up to shoulder, the nerves were tied together, for raynaud's   THORACOTOMY  06/07/2013   Procedure: MINI/LIMITED THORACOTOMY; right middle lobe wedge resection;  Surgeon: Dallas KATHEE Jude, MD;  Location: [facility] OR;  Service: Thoracic;;   TONSILLECTOMY  child   TOTAL ABDOMINAL HYSTERECTOMY  1980's    W/ BSO   TOTAL HIP ARTHROPLASTY Right 04/28/2014   Procedure: RIGHT TOTAL HIP ARTHROPLASTY ANTERIOR APPROACH;  Surgeon: Lonni CINDERELLA Poli, MD;  Location: [facility] ORS;  Service: Orthopedics;  Laterality: Right;   TRANSTHORACIC ECHOCARDIOGRAM  12/11/2008   ef 45-50%, grade 1 diastolic  dysfunction/  mild LAE/  mild AR and MR/  trivial TR   VIDEO ASSISTED THORACOSCOPY (VATS)/WEDGE RESECTION Right 06/07/2013   Procedure: VIDEO ASSISTED THORACOSCOPY (VATS)/right upper lobectomy, On Q;  Surgeon: Dallas KATHEE Jude, MD;  Location: [facility] OR;  Service: Thoracic;  Laterality: Right;   VIDEO BRONCHOSCOPY N/A 06/07/2013   Procedure: VIDEO BRONCHOSCOPY;  Surgeon: Dallas KATHEE Jude, MD;  Location: [facility] OR;  Service: Thoracic;  Laterality: N/A;   VIDEO BRONCHOSCOPY WITH ENDOBRONCHIAL NAVIGATION N/A 05/04/2013   Procedure: VIDEO BRONCHOSCOPY WITH ENDOBRONCHIAL NAVIGATION;  Surgeon: Dallas KATHEE Jude, MD;  Location: [facility] OR;  Service: Thoracic;  Laterality: N/A;    Social History   Tobacco Use   Smoking status: Never    Passive exposure: Past   Smokeless tobacco: Never  Vaping Use   Vaping status: Never Used  Substance Use Topics   Alcohol  use: Not Currently   Drug use: Never    Family History  Problem Relation Age of Onset   Other Mother 12       MVA   Coronary artery disease Father    Colon cancer Father    Diabetes Father    Cancer Father        colon   Healthy Sister    Pneumonia Sister    Healthy Brother    Other Brother        killed in war   Healthy Daughter    Hypothyroidism Daughter    Healthy Daughter    Esophageal cancer Neg Hx    Kidney disease Neg Hx    Liver disease Neg Hx     Allergies  Allergen Reactions   Amlodipine  Rash   Prochlorperazine Edisylate Anaphylaxis    Compazine--- tongue swells and rash   Aspirin  Other (See Comments)    Nose bleeds. Cannot take NSAIDS    Cymbalta [Duloxetine Hcl] Diarrhea, Nausea And Vomiting and Other (See Comments)    Increased blood pressure   Pamelor  [Nortriptyline  Hcl] Diarrhea and Nausea Only    Increased Heart rate and BP    Medication list has been reviewed and updated.  Current Outpatient Medications on File Prior to Visit  Medication Sig Dispense Refill   Abatacept  (ORENCIA  IV) Inject 500 mg into the vein  every 6 (six) weeks.     acetaminophen  (TYLENOL ) 500 MG tablet Take 1 tablet (500 mg total) by mouth every 6 (six) hours as needed (pain). 30 tablet 0   amoxicillin  (AMOXIL ) 500 MG capsule Take 1 capsule (500 mg total) by mouth 2 (two) times daily. 14 capsule 0   Biotin  1000 MCG tablet Take 1,000 mcg by mouth 2 (two) times daily.     carvedilol  (COREG ) 12.5 MG tablet Take  1 tablet (12.5 mg total) by mouth 2 (two) times daily with a meal. 180 tablet 3   estradiol  (ESTRACE ) 0.1 MG/GM vaginal cream Place one gram vaginally up to three times a week as needed to maintain comfort 42.5 g 5   famotidine  (PEPCID ) 20 MG tablet TAKE 1 TABLET BY MOUTH EVERY DAY 90 tablet 3   gabapentin  (NEURONTIN ) 300 MG capsule Take 1 capsule (300 mg total) by mouth 3 (three) times daily. 270 capsule 1   HYDROcodone -acetaminophen  (NORCO/VICODIN) 5-325 MG tablet Take 1 tablet by mouth 2 (two) times daily as needed for moderate pain (pain score 4-6). 30 tablet 0   levothyroxine  (SYNTHROID ) 75 MCG tablet Take 1 tablet (75 mcg total) by mouth daily before breakfast. 90 tablet 1   losartan  (COZAAR ) 25 MG tablet TAKE 1 TABLET (25 MG TOTAL) BY MOUTH DAILY. 90 tablet 1   Multiple Vitamin (MULTIVITAMIN) tablet Take 1 tablet by mouth daily.     ofloxacin  (FLOXIN ) 0.3 % OTIC solution Place 5-10 drops into both ears daily. Use for 10 days 5 mL 0   ondansetron  (ZOFRAN ) 4 MG tablet Take 1 tablet (4 mg total) by mouth every 8 (eight) hours as needed for nausea or vomiting. 30 tablet 1   Probiotic Product (PROBIOTIC PO) Take 1 capsule by mouth daily.      traMADol  (ULTRAM ) 50 MG tablet TAKE 1/2-1 TABLET BY MOUTH EVERY 8 HOURS AS NEEDED. DO NOT COMBINE WITH OTHER PAIN MEDICATION 60 tablet 2   No current facility-administered medications on file prior to visit.    Review of Systems:  As per HPI- otherwise negative.   Physical Examination: Vitals:   11/11/23 1458  BP: 130/72  Pulse: 94  SpO2: 96%   Vitals:   11/11/23 1458   Weight: 110 lb 3.2 oz (50 kg)  Height: 5' (1.524 m)   Body mass index is 21.52 kg/m. Ideal Body Weight: Weight in (lb) to have BMI = 25: 127.7  GEN: no acute distress. Petite build, looks well and her normal self except for tender nodule on left scalp/ temple as pictured below  HEENT: Atraumatic, Normocephalic.  Ears and Nose: No external deformity. CV: RRR, No M/G/R. No JVD. No thrill. No extra heart sounds. PULM: CTA B, no wheezes, crackles, rhonchi. No retractions. No resp. distress. No accessory muscle use. EXTR: No c/c/e PSYCH: Normally interactive. Conversant.    Assessment and Plan: Scalp pain  Scalp lesion  Assessment & Plan Possible shingles (herpes zoster) of the face Acute painful facial lesion, may be shingles, with potential eye risk. Differential includes bug bite. Possible reactivation of varicella. - Prescribed valacyclovir  (Valtrex ) three times daily for one week. Initiate with one dose immediately and another before bed today, then continue with three doses daily. - Advised to monitor for lesion spread or eye involvement and report immediately if symptoms occur. - Recommended postponing activities that may irritate the lesion. - Allowed acetaminophen  and tramadol  for pain; avoid NSAIDs due to kidney concerns.  possilbe temporal arteritis (giant cell arteritis) Concern for temporal arteritis due to lesion location and nature. Rare but serious, needs exclusion. - Ordered sedimentation rate and C-reactive protein tests stat to rule out temporal arteritis. - Advised referral to ophthalmologist if temporal arteritis is suspected based on test results.  Chronic kidney disease Chronic kidney disease necessitates caution with NSAIDs. - Avoid NSAIDs for pain management due to kidney concerns.  Signed Raven Schroeder, MD  Received labs 9/25- concern of TA Called pt and let her  know, [facility] was able to get her in today at 9:45 and she will be there . Fax labs   Asked Mackynzie to let me know what the ophthalmologist thinks   JC  I heard from [place] that opthalmology did dx TA and started Kalista on prednisone  and is getting her set up for a TA biopsy.  She can DC valtrex  as it was causing nausea  Receive the rest of her labs, message to pt Results for orders placed or performed in visit on 11/11/23  CBC   Collection Time: 11/11/23  3:33 PM  Result Value Ref Range   WBC 6.9 4.0 - 10.5 K/uL   RBC 3.68 (L) 3.87 - 5.11 Mil/uL   Platelets 186.0 150.0 - 400.0 K/uL   Hemoglobin 11.0 (L) 12.0 - 15.0 g/dL   HCT 66.6 (L) 63.9 - 53.9 %   MCV 90.3 78.0 - 100.0 fl   MCHC 33.2 30.0 - 36.0 g/dL   RDW 86.2 88.4 - 84.4 %  Basic metabolic panel with GFR   Collection Time: 11/11/23  3:33 PM  Result Value Ref Range   Sodium 135 135 - 145 mEq/L   Potassium 4.8 3.5 - 5.1 mEq/L   Chloride 99 96 - 112 mEq/L   CO2 27 19 - 32 mEq/L   Glucose, Bld 123 (H) 70 - 99 mg/dL   BUN 26 (H) 6 - 23 mg/dL   Creatinine, Ser 8.86 0.40 - 1.20 mg/dL   GFR 57.30 (L) >39.99 mL/min   Calcium  9.0 8.4 - 10.5 mg/dL  C-reactive protein   Collection Time: 11/11/23  3:33 PM  Result Value Ref Range   CRP 29.7 (H) <8.0 mg/L  Sedimentation rate   Collection Time: 11/11/23  3:33 PM  Result Value Ref Range   Sed Rate 86 (H) 0 - 30 mm/h   *Note: Due to a large number of results and/or encounters for the requested time period, some results have not been displayed. A complete set of results can be found in Results Review.

## 2023-11-11 NOTE — Patient Instructions (Signed)
 Good to see you today- I will be in touch with your labs asap Start on the valtrex  three times a day for one week Let me know if the symptoms start to spread onto your eye area or if you have any eye symptoms at all

## 2023-11-12 ENCOUNTER — Telehealth: Payer: Self-pay | Admitting: *Deleted

## 2023-11-12 ENCOUNTER — Other Ambulatory Visit: Payer: Self-pay | Admitting: Family Medicine

## 2023-11-12 ENCOUNTER — Encounter: Payer: Self-pay | Admitting: Family Medicine

## 2023-11-12 ENCOUNTER — Encounter: Payer: Self-pay | Admitting: Internal Medicine

## 2023-11-12 DIAGNOSIS — L989 Disorder of the skin and subcutaneous tissue, unspecified: Secondary | ICD-10-CM

## 2023-11-12 DIAGNOSIS — H35383 Toxic maculopathy, bilateral: Secondary | ICD-10-CM | POA: Diagnosis not present

## 2023-11-12 DIAGNOSIS — R519 Headache, unspecified: Secondary | ICD-10-CM

## 2023-11-12 LAB — BASIC METABOLIC PANEL WITH GFR
BUN: 26 mg/dL — ABNORMAL HIGH (ref 6–23)
CO2: 27 meq/L (ref 19–32)
Calcium: 9 mg/dL (ref 8.4–10.5)
Chloride: 99 meq/L (ref 96–112)
Creatinine, Ser: 1.13 mg/dL (ref 0.40–1.20)
GFR: 42.69 mL/min — ABNORMAL LOW (ref >60.00)
Glucose, Bld: 123 mg/dL — ABNORMAL HIGH (ref 70–99)
Potassium: 4.8 meq/L (ref 3.5–5.1)
Sodium: 135 meq/L (ref 135–145)

## 2023-11-12 LAB — CBC
HCT: 33.3 % — ABNORMAL LOW (ref 36.0–46.0)
Hemoglobin: 11 g/dL — ABNORMAL LOW (ref 12.0–15.0)
MCHC: 33.2 g/dL (ref 30.0–36.0)
MCV: 90.3 fl (ref 78.0–100.0)
Platelets: 186 K/uL (ref 150.0–400.0)
RBC: 3.68 Mil/uL — ABNORMAL LOW (ref 3.87–5.11)
RDW: 13.7 % (ref 11.5–15.5)
WBC: 6.9 K/uL (ref 4.0–10.5)

## 2023-11-12 LAB — C-REACTIVE PROTEIN: CRP: 29.7 mg/L — ABNORMAL HIGH (ref <8.0)

## 2023-11-12 LAB — SEDIMENTATION RATE: Sed Rate: 86 mm/h — ABNORMAL HIGH (ref 0–30)

## 2023-11-12 MED ORDER — ONDANSETRON HCL 4 MG PO TABS: 4.0000 mg | ORAL_TABLET | Freq: Three times a day (TID) | ORAL | 1 refills | Status: AC | PRN

## 2023-11-12 NOTE — Progress Notes (Unsigned)
 Office Visit Note  Patient: [name]             Date of Birth: [birth year]           MRN: [id number]             PCP: Watt Harlene BROCKS, MD Referring: Watt Harlene BROCKS, MD Visit Date: 11/13/2023 Occupation: Data Unavailable  Subjective:  No chief complaint on file.   History of Present Illness: [name] is a 89 y.o. female ***     Activities of Daily Living:  Patient reports morning stiffness for *** {minute/hour:19697}.   Patient {ACTIONS;DENIES/REPORTS:21021675::Denies} nocturnal pain.  Difficulty dressing/grooming: {ACTIONS;DENIES/REPORTS:21021675::Denies} Difficulty climbing stairs: {ACTIONS;DENIES/REPORTS:21021675::Denies} Difficulty getting out of chair: {ACTIONS;DENIES/REPORTS:21021675::Denies} Difficulty using hands for taps, buttons, cutlery, and/or writing: {ACTIONS;DENIES/REPORTS:21021675::Denies}  No Rheumatology ROS completed.   PMFS History:  Patient Active Problem List   Diagnosis Date Noted   Diarrhea of presumed infectious origin 08/22/2022   Colitis 08/18/2022   Acute congestive heart failure (HCC) 12/29/2021   Acute respiratory failure (HCC) 12/24/2021   Acute respiratory failure with hypoxia (HCC) 12/23/2021   Acute on chronic combined systolic and diastolic CHF (congestive heart failure) (HCC) 12/23/2021   Rectal pain 10/10/2020   Acute posthemorrhagic anemia    Rectal bleeding 06/17/2019   Grade II internal hemorrhoids    Rectal ulcer    Popliteal artery occlusion, right 04/10/2018   HTN (hypertension) 04/10/2018   Femoral artery pseudo-aneurysm, right 04/09/2018   Anemia of chronic disease 02/24/2018   Unstable right hip arthroplasty 01/24/2018   History of revision of total replacement of right hip joint 01/24/2018   Hypovolemic shock (HCC)    Hyperkalemia    Hyponatremia    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   Status post revision of total hip 11/27/2017   Elevated cholesterol 10/11/2015   Adrenal  gland hyperfunction 10/04/2014   Bilateral leg edema 08/01/2014   Elevated BP 08/01/2014   Rheumatoid arthritis involving multiple joints (HCC) 05/30/2014   Status post total replacement of right hip 04/28/2014   Long-term use of high-risk medication 11/11/2013   Symptomatic anemia 11/11/2013   Neuropathic pain 07/07/2013   Constipation due to pain medication 07/07/2013   Protein-calorie malnutrition, severe (HCC) 06/08/2013   Lung cancer, Right upper lobe 05/08/2013   Sciatica of right side 08/13/2011   Osteoporosis 03/07/2010   PARESTHESIA 03/07/2010   Low back pain 03/16/2009   CT, CHEST, ABNORMAL 12/18/2008   ABNORMAL ECHOCARDIOGRAM 12/14/2008   SJOGREN'S SYNDROME 11/29/2008   HYPOGLYCEMIA 06/29/2006   RAYNAUD'S DISEASE 06/29/2006    Past Medical History:  Diagnosis Date   Arthralgia of multiple joints    followed by dr ziolkowska   Arthritis    C. difficile colitis 08/2022   Cardiomyopathy (HCC)    Chronic constipation    Chronic inflammatory arthritis    rhemotolgist-  dr a. ziolkowska ([facility])   Dry eyes    eye drops used    GERD (gastroesophageal reflux disease)    H/O discoid lupus erythematosus    Hiatal hernia    History of colon polyps    Hypothyroidism    Iron deficiency anemia    LBBB (left bundle branch block) 2010   Mitchell's disease (erythromelalgia)    neurologist-  dr patel   Nocturia    Non-small cell cancer of right lung [facility]) surgeon-- dr gerhardt/  oncologist-  dr sherrod--- per lov notes no recurrence/   11-18-2017 per pt denies any symptoms   dx 2015--  Stage IIA (T2b,N0,M0) , +EGFR  mutation in exon 21, non-small cell adenocarcinoma right upper lobe---  s/p  Right upper lobectomy , right middley wedge resection and node dissection---  no chemo or radiation therapy   OA (osteoarthritis)    hands   Osteoporosis    PONV (postoperative nausea and vomiting)    likes phenergan    Raynaud's phenomenon 1965   Renal insufficiency     Rheumatoid arthritis (HCC)    Sciatica    Scoliosis    Sjogren's syndrome     Family History  Problem Relation Age of Onset   Other Mother 61       MVA   Coronary artery disease Father    Colon cancer Father    Diabetes Father    Cancer Father        colon   Healthy Sister    Pneumonia Sister    Healthy Brother    Other Brother        killed in war   Healthy Daughter    Hypothyroidism Daughter    Healthy Daughter    Esophageal cancer Neg Hx    Kidney disease Neg Hx    Liver disease Neg Hx    Past Surgical History:  Procedure Laterality Date   ANTERIOR HIP REVISION Right 11/27/2017   Procedure: RIGHT HIP ACETABULAR REVISION;  Surgeon: Vernetta Lonni GRADE, MD;  Location: [facility]S;  Service: Orthopedics;  Laterality: Right;   ANTERIOR HIP REVISION Right 01/24/2018   Procedure: OPEN REDUCTION OF DISLOCATED ANTERIOR HIP WITH REVISION OF LINER AND HIP BALL;  Surgeon: Vernetta Lonni GRADE, MD;  Location: [facility];  Service: Orthopedics;  Laterality: Right;   APPENDECTOMY  1950s   BIOPSY  04/14/2018   Procedure: BIOPSY;  Surgeon: Leigh Elspeth SQUIBB, MD;  Location: [facility] ENDOSCOPY;  Service: Gastroenterology;;   BIOPSY  04/16/2018   Procedure: BIOPSY;  Surgeon: Wilhelmenia Aloha Raddle., MD;  Location: [facility] ENDOSCOPY;  Service: Gastroenterology;;   CARDIOVASCULAR STRESS TEST  12/2008    mild fixed basal to mid septal perfusion defect felt likely due to artifact from LBBB, no ischemia, EF 58%   COLONOSCOPY     COLONOSCOPY WITH PROPOFOL  N/A 04/16/2018   Procedure: COLONOSCOPY WITH PROPOFOL ;  Surgeon: Wilhelmenia Aloha Raddle., MD;  Location: [facility] ENDOSCOPY;  Service: Gastroenterology;  Laterality: N/A;   ESOPHAGOGASTRODUODENOSCOPY (EGD) WITH PROPOFOL  N/A 04/14/2018   Procedure: ESOPHAGOGASTRODUODENOSCOPY (EGD) WITH PROPOFOL ;  Surgeon: Leigh Elspeth SQUIBB, MD;  Location: [facility] ENDOSCOPY;  Service: Gastroenterology;  Laterality: N/A;   FEMORAL-POPLITEAL BYPASS GRAFT Right 04/10/2018   Procedure:  REPAIR RIGHT FEMORAL ARTERY PSEUDOANEURYSM, RETROPERITONEAL EXPOSURE OF ILIAC ARTERY, RIGHT POPLITEAL EMBOLECTOMY;  Surgeon: Eliza Lonni RAMAN, MD;  Location: [facility] OR;  Service: Vascular;  Laterality: Right;   FLEXIBLE SIGMOIDOSCOPY N/A 06/17/2019   Procedure: FLEXIBLE SIGMOIDOSCOPY;  Surgeon: San Sandor GAILS, DO;  Location: [facility] ENDOSCOPY;  Service: Gastroenterology;  Laterality: N/A;   HEMOSTASIS CLIP PLACEMENT  06/17/2019   Procedure: HEMOSTASIS CLIP PLACEMENT;  Surgeon: San Sandor GAILS, DO;  Location: [facility] ENDOSCOPY;  Service: Gastroenterology;;   LYMPH NODE DISSECTION Right 06/07/2013   Procedure: LYMPH NODE DISSECTION;  Surgeon: Dallas KATHEE Jude, MD;  Location: [facility] OR;  Service: Thoracic;  Laterality: Right;   PATCH ANGIOPLASTY Right 04/10/2018   Procedure: PATCH  ANGIOPLASTY OF RIGHT FEMORAL ARTERY USING BOVINE PATCH, PATCH ANGIOPLASTY OF RIGHT POPLITEAL ARTERY USING BOVINE PATCH;  Surgeon: Eliza Lonni RAMAN, MD;  Location: [facility] OR;  Service: Vascular;  Laterality: Right;   SCHLEROTHERAPY  06/17/2019  Procedure: SCHLEROTHERAPY OF VARICES;  Surgeon: San Sandor GAILS, DO;  Location: [facility] ENDOSCOPY;  Service: Gastroenterology;;   THORACIC SYMPATHETECTOMY  1965   large incision from chest to up to shoulder, the nerves were tied together, for raynaud's   THORACOTOMY  06/07/2013   Procedure: MINI/LIMITED THORACOTOMY; right middle lobe wedge resection;  Surgeon: Dallas KATHEE Jude, MD;  Location: [facility] OR;  Service: Thoracic;;   TONSILLECTOMY  child   TOTAL ABDOMINAL HYSTERECTOMY  1980's    W/ BSO   TOTAL HIP ARTHROPLASTY Right 04/28/2014   Procedure: RIGHT TOTAL HIP ARTHROPLASTY ANTERIOR APPROACH;  Surgeon: Lonni CINDERELLA Poli, MD;  Location: [facility];  Service: Orthopedics;  Laterality: Right;   TRANSTHORACIC ECHOCARDIOGRAM  12/11/2008   ef 45-50%, grade 1 diastolic dysfunction/  mild LAE/  mild AR and MR/  trivial TR   VIDEO ASSISTED THORACOSCOPY (VATS)/WEDGE RESECTION Right 06/07/2013    Procedure: VIDEO ASSISTED THORACOSCOPY (VATS)/right upper lobectomy, On Q;  Surgeon: Dallas KATHEE Jude, MD;  Location: [facility] OR;  Service: Thoracic;  Laterality: Right;   VIDEO BRONCHOSCOPY N/A 06/07/2013   Procedure: VIDEO BRONCHOSCOPY;  Surgeon: Dallas KATHEE Jude, MD;  Location: [facility] OR;  Service: Thoracic;  Laterality: N/A;   VIDEO BRONCHOSCOPY WITH ENDOBRONCHIAL NAVIGATION N/A 05/04/2013   Procedure: VIDEO BRONCHOSCOPY WITH ENDOBRONCHIAL NAVIGATION;  Surgeon: Dallas KATHEE Jude, MD;  Location: [facility] OR;  Service: Thoracic;  Laterality: N/A;   Social History   Tobacco Use   Smoking status: Never    Passive exposure: Past   Smokeless tobacco: Never  Vaping Use   Vaping status: Never Used  Substance Use Topics   Alcohol  use: Not Currently   Drug use: Never   Social History   Social History Narrative   Lives with husband, daughter and grandchild local.   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 History  Administered Date(s) Administered   Fluad Quad(high Dose 65+) 11/03/2018, 12/14/2019   Fluad Trivalent(High Dose 65+) 01/21/2023   Fluzone Influenza virus vaccine,trivalent (IIV3), split virus 11/14/2015   INFLUENZA, HIGH DOSE SEASONAL PF 10/30/2012, 01/02/2015, 11/13/2016, 11/12/2017   Influenza Split 11/21/2011   Influenza Whole 11/29/2007, 11/29/2008, 11/29/2009   Influenza,inj,Quad PF,6+ Mos 11/22/2013, 11/14/2015   Influenza-Unspecified 11/13/2016, 11/12/2017, 12/14/2020   PFIZER(Purple Top)SARS-COV-2 Vaccination 04/12/2019, 05/03/2019, 01/20/2020, 12/14/2020   Pneumococcal Conjugate-13 05/22/2015   Pneumococcal Polysaccharide-23 06/13/2013   Tdap 08/17/2017   Zoster, Live 01/27/2014     Objective: Vital Signs: There were no vitals taken for this visit.   Physical Exam   Musculoskeletal Exam: ***  CDAI Exam: CDAI Score: -- Patient Global: --; Provider Global: -- Swollen: --; Tender: -- Joint Exam 11/13/2023   No joint  exam has been documented for this visit   There is currently no information documented on the homunculus. Go to the Rheumatology activity and complete the homunculus joint exam.  Investigation: No additional findings.  Imaging: No results found.  Recent Labs: Lab Results  Component Value Date   WBC 6.9 11/11/2023   HGB 11.0 (L) 11/11/2023   PLT 186.0 11/11/2023   NA 135 11/11/2023   K 4.8 11/11/2023   CL 99 11/11/2023   CO2 27 11/11/2023   GLUCOSE 123 (H) 11/11/2023   BUN 26 (H) 11/11/2023   CREATININE 1.13 11/11/2023   BILITOT 0.3 06/22/2023   ALKPHOS 34 (L) 06/22/2023   AST 25 06/22/2023   ALT 13 06/22/2023   PROT 7.5 06/22/2023   ALBUMIN  4.5 06/22/2023   CALCIUM   9.0 11/11/2023   GFRAA 60 05/21/2020   QFTBGOLDPLUS Negative 10/01/2022    Speciality Comments: TX (elevated LFTs) and leflunomide  (hair loss) Plaquenil  (had macular changes)  Orencia  - 7/21 Prolia  - 04/22/19, 11/09/19, 06/06/20,12/20/20  Fu q 3 months  Procedures:  No procedures performed Allergies: Amlodipine , Prochlorperazine edisylate, Aspirin , Cymbalta [duloxetine hcl], and Pamelor  [nortriptyline  hcl]   Assessment / Plan:     Visit Diagnoses: No diagnosis found.  Orders: No orders of the defined types were placed in this encounter.  No orders of the defined types were placed in this encounter.   Face-to-face time spent with patient was *** minutes. Greater than 50% of time was spent in counseling and coordination of care.  Follow-Up Instructions: No follow-ups on file.   Maya Nash, MD  Note - This record has been created using Animal nutritionist.  Chart creation errors have been sought, but may not always  have been located. Such creation errors do not reflect on  the standard of medical care.

## 2023-11-12 NOTE — Telephone Encounter (Signed)
 I returned patient's call.  Patient states that few days back she noticed a bump between her eye and her forehead.  She states then it turned into a sore.  She was seen by Dr. Ubaldo who did labs and found that her sedimentation rate was elevated.  She was concerned about shingles and referred her to Dr. Camillo.  Patient states she was evaluated by Dr. Marcey today and was diagnosed with possible temporal arteritis.  She was placed on prednisone  90 mg every morning.  She denies any visual changes.  She believes that she was referred for temporal artery biopsy. I called Dr. Marcey at [facility] care.  Dr. Marcey mentioned that she did not have any visual changes on the examination.  She had an indurated area superior to the temporal artery.  He was concerned about the inflammatory markers and placed on prednisone .  He has not referred her for temporal artery biopsy yet. We call patient and schedule her to come tomorrow at 11 AM.

## 2023-11-12 NOTE — Addendum Note (Signed)
 Addended by: WATT RAISIN C on: 11/12/2023 05:30 PM   Modules accepted: Orders

## 2023-11-12 NOTE — Telephone Encounter (Signed)
 Patient called, patient went to eye doctor today- [name], patient states she was diagnosed with temporal arteritis, patient is on prednisone , patient is scheduled for infusion 11/20/2023. Patient wants to know if she should cancel her infusion? Please advise.

## 2023-11-13 ENCOUNTER — Encounter: Payer: Self-pay | Admitting: Rheumatology

## 2023-11-13 ENCOUNTER — Encounter: Attending: Rheumatology | Admitting: Rheumatology

## 2023-11-13 VITALS — BP 166/68 | HR 62 | Temp 97.6°F | Resp 13 | Ht 60.0 in | Wt 110.2 lb

## 2023-11-13 DIAGNOSIS — R700 Elevated erythrocyte sedimentation rate: Secondary | ICD-10-CM | POA: Insufficient documentation

## 2023-11-13 DIAGNOSIS — E270 Other adrenocortical overactivity: Secondary | ICD-10-CM | POA: Insufficient documentation

## 2023-11-13 DIAGNOSIS — M3500 Sicca syndrome, unspecified: Secondary | ICD-10-CM | POA: Insufficient documentation

## 2023-11-13 DIAGNOSIS — M810 Age-related osteoporosis without current pathological fracture: Secondary | ICD-10-CM | POA: Insufficient documentation

## 2023-11-13 DIAGNOSIS — E7800 Pure hypercholesterolemia, unspecified: Secondary | ICD-10-CM | POA: Insufficient documentation

## 2023-11-13 DIAGNOSIS — Z79899 Other long term (current) drug therapy: Secondary | ICD-10-CM | POA: Insufficient documentation

## 2023-11-13 DIAGNOSIS — R300 Dysuria: Secondary | ICD-10-CM | POA: Diagnosis not present

## 2023-11-13 DIAGNOSIS — Z87440 Personal history of urinary (tract) infections: Secondary | ICD-10-CM | POA: Insufficient documentation

## 2023-11-13 DIAGNOSIS — I7300 Raynaud's syndrome without gangrene: Secondary | ICD-10-CM | POA: Insufficient documentation

## 2023-11-13 DIAGNOSIS — Z96641 Presence of right artificial hip joint: Secondary | ICD-10-CM | POA: Insufficient documentation

## 2023-11-13 DIAGNOSIS — I10 Essential (primary) hypertension: Secondary | ICD-10-CM | POA: Insufficient documentation

## 2023-11-13 DIAGNOSIS — R350 Frequency of micturition: Secondary | ICD-10-CM | POA: Insufficient documentation

## 2023-11-13 DIAGNOSIS — L0292 Furuncle, unspecified: Secondary | ICD-10-CM | POA: Diagnosis not present

## 2023-11-13 DIAGNOSIS — M4125 Other idiopathic scoliosis, thoracolumbar region: Secondary | ICD-10-CM | POA: Insufficient documentation

## 2023-11-13 DIAGNOSIS — D638 Anemia in other chronic diseases classified elsewhere: Secondary | ICD-10-CM | POA: Insufficient documentation

## 2023-11-13 DIAGNOSIS — M0609 Rheumatoid arthritis without rheumatoid factor, multiple sites: Secondary | ICD-10-CM | POA: Diagnosis not present

## 2023-11-13 DIAGNOSIS — C3411 Malignant neoplasm of upper lobe, right bronchus or lung: Secondary | ICD-10-CM | POA: Insufficient documentation

## 2023-11-13 MED ORDER — PREDNISONE 5 MG PO TABS: ORAL_TABLET | ORAL | 0 refills | Status: DC

## 2023-11-13 NOTE — Patient Instructions (Addendum)
 [facility]  Address: [address], [place], [place] [address] Phone: [phone]  11/13/2023 2:30pm with Annitta Lea , FNP

## 2023-11-15 ENCOUNTER — Ambulatory Visit: Payer: Self-pay | Admitting: Rheumatology

## 2023-11-15 LAB — URINE CULTURE
MICRO NUMBER:: 17023745
SPECIMEN QUALITY:: ADEQUATE

## 2023-11-15 NOTE — Progress Notes (Signed)
 Called Ms. Brumbach regarding the urine culture results.  She will contact Dr. Eleanore office tomorrow morning to get antibiotics. I also advised her that Dr. Gaylene office will contact her tomorrow regarding her appointment on Tuesday.

## 2023-11-16 ENCOUNTER — Encounter: Payer: Self-pay | Admitting: Family Medicine

## 2023-11-16 ENCOUNTER — Other Ambulatory Visit: Payer: Self-pay | Admitting: Family Medicine

## 2023-11-16 MED ORDER — CEPHALEXIN 500 MG PO CAPS: 500.0000 mg | ORAL_CAPSULE | Freq: Two times a day (BID) | ORAL | 0 refills | Status: DC

## 2023-11-17 ENCOUNTER — Encounter: Payer: Self-pay | Admitting: Vascular Surgery

## 2023-11-17 ENCOUNTER — Encounter (HOSPITAL_COMMUNITY): Payer: Self-pay | Admitting: Vascular Surgery

## 2023-11-17 ENCOUNTER — Other Ambulatory Visit: Payer: Self-pay

## 2023-11-17 ENCOUNTER — Ambulatory Visit: Attending: Vascular Surgery | Admitting: Vascular Surgery

## 2023-11-17 VITALS — BP 162/78 | HR 68 | Temp 97.7°F | Resp 20 | Ht 60.0 in | Wt 110.0 lb

## 2023-11-17 DIAGNOSIS — M316 Other giant cell arteritis: Secondary | ICD-10-CM

## 2023-11-17 DIAGNOSIS — R519 Headache, unspecified: Secondary | ICD-10-CM | POA: Insufficient documentation

## 2023-11-17 NOTE — Anesthesia Preprocedure Evaluation (Signed)
 Anesthesia Evaluation  Patient identified by MRN, date of birth, ID band Patient awake    Reviewed: Allergy & Precautions, NPO status , Patient's Chart, lab work & pertinent test results, reviewed documented beta blocker date and time   History of Anesthesia Complications (+) PONV and history of anesthetic complications  Airway Mallampati: II  TM Distance: >3 FB Neck ROM: Full    Dental  (+) Dental Advisory Given, Lower Dentures, Upper Dentures   Pulmonary  Non-small cell cancer of right lung   Pulmonary exam normal breath sounds clear to auscultation       Cardiovascular hypertension, Pt. on home beta blockers and Pt. on medications (-) angina + Peripheral Vascular Disease and +CHF  (-) Past MI Normal cardiovascular exam+ dysrhythmias (LBBB) + Valvular Problems/Murmurs MR  Rhythm:Regular Rate:Normal  Temporal arteritis   Neuro/Psych  Headaches, neg Seizures  Neuromuscular disease  negative psych ROS   GI/Hepatic Neg liver ROS, PUD,GERD  Medicated,,  Endo/Other  Hypothyroidism    Renal/GU Renal InsufficiencyRenal disease     Musculoskeletal  (+) Arthritis , Rheumatoid disorders,  Sjogren's syndrome   Abdominal   Peds  Hematology  (+) Blood dyscrasia, anemia   Anesthesia Other Findings Day of surgery medications reviewed with the patient.  Reproductive/Obstetrics                              Anesthesia Physical Anesthesia Plan  ASA: 3  Anesthesia Plan: MAC   Post-op Pain Management: Minimal or no pain anticipated   Induction: Intravenous  PONV Risk Score and Plan: 3 and TIVA  Airway Management Planned: Natural Airway and Simple Face Mask  Additional Equipment:   Intra-op Plan:   Post-operative Plan:   Informed Consent: I have reviewed the patients History and Physical, chart, labs and discussed the procedure including the risks, benefits and alternatives for the proposed  anesthesia with the patient or authorized representative who has indicated his/her understanding and acceptance.     Dental advisory given  Plan Discussed with: CRNA and Anesthesiologist  Anesthesia Plan Comments: (PAT note by Lynwood Hope, PA-C: 89 year old female follows with cardiology for history of palpitations, moderate to severe mitral regurgitation, left bundle branch block, chronic combined heart failure, cardiomyopathy (felt secondary to dyssynchrony).  Echo 08/2022 showed LVEF 40 to 45%, grade 1 DD, normal RV systolic function, moderate to severe mitral regurgitation, mild aortic insufficiency.  Last seen by Dr. Pietro on 10/28/2023.  Recommended continue conservative management for her cardiomyopathy and moderate to severe mitral regurgitation.  No further testing as there is no plan for intervention.  Follows with oncology for history of multifocal bilateral non-small cell lung cancer s/p right upper lobectomy with wedge resection of the right middle lobe 2015.  She declined adjuvant systemic chemotherapy.  Recent imaging showed increase in the size and number of multifocal disease.  At last follow-up with Dr. Sherrod on 07/24/2022 the patient elected to continue with observation.  Follows with rheumatology for history of seronegative rheumatoid arthritis, Raynaud's.  Orencia  infusions currently on hold due to recurrent infections.  When last seen on 11/13/2023 she was noted to have swelling and tenderness in the left temporal region.  Per note, this was initially noted by Dr. Marcey at [facility] eye care on 11/12/2023 and she was started on prednisone  at that time. She was referred to vascular surgery for evaluation of possible temporal arteritis.    Other pertinent history includes PONV, moderate hiatal hernia noted  on CT chest 08/20/2022, CKD 3 by labs, hypothyroidism.  BMP and CBC 11/11/2023 reviewed, GFR 43 consistent with history of CKD, mild anemia with hemoglobin 11.0, otherwise  unremarkable.  Patient will need to have surgery evaluation.  EKG 04/11/2023: Sinus rhythm.  Rate 82. Atrial premature complex. Left atrial enlargement. Left bundle branch block  TTE 08/21/2022: 1. No vegetations or evidence of SBE.  2. Left ventricular ejection fraction, by estimation, is 40 to 45%. The  left ventricle has mildly decreased function. The left ventricle  demonstrates global hypokinesis. The left ventricular internal cavity size  was severely dilated. Left ventricular  diastolic parameters are consistent with Grade I diastolic dysfunction  (impaired relaxation).  3. Right ventricular systolic function is normal. The right ventricular  size is normal. There is normal pulmonary artery systolic pressure.  4. Left atrial size was moderately dilated.  5. The mitral valve is abnormal. Moderate to severe mitral valve  regurgitation. No evidence of mitral stenosis.  6. The aortic valve is tricuspid. There is mild calcification of the  aortic valve. There is mild thickening of the aortic valve. Aortic valve  regurgitation is mild. Aortic valve sclerosis/calcification is present,  without any evidence of aortic  stenosis.  7. The inferior vena cava is normal in size with greater than 50%  respiratory variability, suggesting right atrial pressure of 3 mmHg.   )         Anesthesia Quick Evaluation

## 2023-11-17 NOTE — Progress Notes (Signed)
 SDW CALL  Patient's daughter was given pre-op instructions over the phone. The opportunity was given for the patient's daughter to ask questions. No further questions asked. Patient's daughter verbalized understanding of instructions given.   PCP - Harlene Copland Cardiologist - Dr. Redell Shallow Rheumatologist - Maya Nash  PPM/ICD - denies   Chest x-ray - 04/11/23 EKG - 06/22/23 Stress Test -  ECHO - 08/21/22 Cardiac Cath -   Sleep Study - denies  No DM  Last dose of GLP1 agonist-  n/a GLP1 instructions:  n/a  Blood Thinner Instructions: n/a Aspirin  Instructions: n/a  ERAS Protcol - NPO PRE-SURGERY Ensure or G2- n/a  COVID TEST- n/a   Anesthesia review: yes  Patient's daughter denies shortness of breath, fever, cough and chest pain over the phone call   All instructions explained to the patient's daughter, with a verbal understanding of the material. Patient's daughter agrees to go over the instructions while at home for a better understanding.    Medication instructions texted to daughter as well.

## 2023-11-17 NOTE — Progress Notes (Signed)
 Patient name: [name] MRN: [id number] DOB: [birth year] Sex: female  REASON FOR CONSULT: Temporal artery biopsy  HPI: [name] is a 89 y.o. female, with history of rheumatoid arthritis and Sjogren's that presents for evaluation of possible temporal artery biopsy.  Patient states last week she developed left-sided temporal tenderness with headache as well as blurry vision in her left eye.  She has been seen by rheumatology by Dr. Dolphus who has started her on steroids.  She still has left temporal tenderness today.  She did have an elevated sed rate of 86 and CRP 29.    She is known to our practice and had a prior right femoral artery pseudoaneurysm with right popliteal artery embolus requiring embolectomy and femoral endarterectomy with Dr. Eliza in 2020.   Past Medical History:  Diagnosis Date   Arthralgia of multiple joints    followed by dr ziolkowska   Arthritis    C. difficile colitis 08/2022   Cardiomyopathy (HCC)    Chronic constipation    Chronic inflammatory arthritis    rhemotolgist-  dr a. ziolkowska ([facility])   Dry eyes    eye drops used    GERD (gastroesophageal reflux disease)    H/O discoid lupus erythematosus    Hiatal hernia    History of colon polyps    Hypothyroidism    Iron deficiency anemia    LBBB (left bundle branch block) 2010   Mitchell's disease (erythromelalgia)    neurologist-  dr patel   Nocturia    Non-small cell cancer of right lung [facility]) surgeon-- dr gerhardt/  oncologist-  dr sherrod--- per lov notes no recurrence/   11-18-2017 per pt denies any symptoms   dx 2015--  Stage IIA (T2b,N0,M0) , +EGFR  mutation in exon 21, non-small cell adenocarcinoma right upper lobe---  s/p  Right upper lobectomy , right middley wedge resection and node dissection---  no chemo or radiation therapy   OA (osteoarthritis)    hands   Osteoporosis    PONV (postoperative nausea and vomiting)    likes phenergan    Raynaud's phenomenon 1965    Renal insufficiency    Rheumatoid arthritis (HCC)    Sciatica    Scoliosis    Sjogren's syndrome     Past Surgical History:  Procedure Laterality Date   ANTERIOR HIP REVISION Right 11/27/2017   Procedure: RIGHT HIP ACETABULAR REVISION;  Surgeon: Vernetta Lonni GRADE, MD;  Location: [facility] ORS;  Service: Orthopedics;  Laterality: Right;   ANTERIOR HIP REVISION Right 01/24/2018   Procedure: OPEN REDUCTION OF DISLOCATED ANTERIOR HIP WITH REVISION OF LINER AND HIP BALL;  Surgeon: Vernetta Lonni GRADE, MD;  Location: [facility];  Service: Orthopedics;  Laterality: Right;   APPENDECTOMY  1950s   BIOPSY  04/14/2018   Procedure: BIOPSY;  Surgeon: Leigh Elspeth SQUIBB, MD;  Location: [facility] ENDOSCOPY;  Service: Gastroenterology;;   BIOPSY  04/16/2018   Procedure: BIOPSY;  Surgeon: Wilhelmenia Aloha Raddle., MD;  Location: [facility] ENDOSCOPY;  Service: Gastroenterology;;   CARDIOVASCULAR STRESS TEST  12/2008    mild fixed basal to mid septal perfusion defect felt likely due to artifact from LBBB, no ischemia, EF 58%   COLONOSCOPY     COLONOSCOPY WITH PROPOFOL  N/A 04/16/2018   Procedure: COLONOSCOPY WITH PROPOFOL ;  Surgeon: Wilhelmenia Aloha Raddle., MD;  Location: [facility] ENDOSCOPY;  Service: Gastroenterology;  Laterality: N/A;   ESOPHAGOGASTRODUODENOSCOPY (EGD) WITH PROPOFOL  N/A 04/14/2018   Procedure: ESOPHAGOGASTRODUODENOSCOPY (EGD) WITH PROPOFOL ;  Surgeon: Leigh Elspeth SQUIBB,  MD;  Location: [facility] ENDOSCOPY;  Service: Gastroenterology;  Laterality: N/A;   FEMORAL-POPLITEAL BYPASS GRAFT Right 04/10/2018   Procedure: REPAIR RIGHT FEMORAL ARTERY PSEUDOANEURYSM, RETROPERITONEAL EXPOSURE OF ILIAC ARTERY, RIGHT POPLITEAL EMBOLECTOMY;  Surgeon: Eliza Lonni RAMAN, MD;  Location: [facility] OR;  Service: Vascular;  Laterality: Right;   FLEXIBLE SIGMOIDOSCOPY N/A 06/17/2019   Procedure: FLEXIBLE SIGMOIDOSCOPY;  Surgeon: San Sandor GAILS, DO;  Location: [facility] ENDOSCOPY;  Service: Gastroenterology;  Laterality: N/A;   HEMOSTASIS CLIP  PLACEMENT  06/17/2019   Procedure: HEMOSTASIS CLIP PLACEMENT;  Surgeon: San Sandor GAILS, DO;  Location: [facility] ENDOSCOPY;  Service: Gastroenterology;;   LYMPH NODE DISSECTION Right 06/07/2013   Procedure: LYMPH NODE DISSECTION;  Surgeon: Dallas KATHEE Jude, MD;  [facility] OR;  Service: Thoracic;  Laterality: Right;   PATCH ANGIOPLASTY Right 04/10/2018   Procedure: PATCH  ANGIOPLASTY OF RIGHT FEMORAL ARTERY USING BOVINE PATCH, PATCH ANGIOPLASTY OF RIGHT POPLITEAL ARTERY USING BOVINE PATCH;  Surgeon: Eliza Lonni RAMAN, MD;  Location: [facility] OR;  Service: Vascular;  Laterality: Right;   SCHLEROTHERAPY  06/17/2019   Procedure: WALDEMAR OF VARICES;  Surgeon: San Sandor GAILS, DO;  Location: [facility] ENDOSCOPY;  Service: Gastroenterology;;   THORACIC SYMPATHETECTOMY  1965   large incision from chest to up to shoulder, the nerves were tied together, for raynaud's   THORACOTOMY  06/07/2013   Procedure: MINI/LIMITED THORACOTOMY; right middle lobe wedge resection;  Surgeon: Dallas KATHEE Jude, MD;  Location: [facility] OR;  Service: Thoracic;;   TONSILLECTOMY  child   TOTAL ABDOMINAL HYSTERECTOMY  1980's    W/ BSO   TOTAL HIP ARTHROPLASTY Right 04/28/2014   Procedure: RIGHT TOTAL HIP ARTHROPLASTY ANTERIOR APPROACH;  Surgeon: Lonni CINDERELLA Poli, MD;  Location: [facility]S;  Service: Orthopedics;  Laterality: Right;   TRANSTHORACIC ECHOCARDIOGRAM  12/11/2008   ef 45-50%, grade 1 diastolic dysfunction/  mild LAE/  mild AR and MR/  trivial TR   VIDEO ASSISTED THORACOSCOPY (VATS)/WEDGE RESECTION Right 06/07/2013   Procedure: VIDEO ASSISTED THORACOSCOPY (VATS)/right upper lobectomy, On Q;  Surgeon: Dallas KATHEE Jude, MD;  Location: [facility] OR;  Service: Thoracic;  Laterality: Right;   VIDEO BRONCHOSCOPY N/A 06/07/2013   Procedure: VIDEO BRONCHOSCOPY;  Surgeon: Dallas KATHEE Jude, MD;  Location: [facility] OR;  Service: Thoracic;  Laterality: N/A;   VIDEO BRONCHOSCOPY WITH ENDOBRONCHIAL NAVIGATION N/A 05/04/2013   Procedure: VIDEO  BRONCHOSCOPY WITH ENDOBRONCHIAL NAVIGATION;  Surgeon: Dallas KATHEE Jude, MD;  Location: [facility] OR;  Service: Thoracic;  Laterality: N/A;    Family History  Problem Relation Age of Onset   Other Mother 31       MVA   Coronary artery disease Father    Colon cancer Father    Diabetes Father    Cancer Father        colon   Healthy Sister    Pneumonia Sister    Healthy Brother    Other Brother        killed in war   Healthy Daughter    Hypothyroidism Daughter    Healthy Daughter    Esophageal cancer Neg Hx    Kidney disease Neg Hx    Liver disease Neg Hx     SOCIAL HISTORY: Social History   Socioeconomic History   Marital status: Widowed    Spouse name: Not on file   Number of children: 2   Years of education: Not on file   Highest education level: Master's degree (e.g., MA, MS, MEng, MEd, MSW, MBA)  Occupational History   Occupation: n/a  Tobacco Use   Smoking status: Never    Passive exposure: Past   Smokeless tobacco: Never  Vaping Use   Vaping status: Never Used  Substance and Sexual Activity   Alcohol  use: Not Currently   Drug use: Never   Sexual activity: Not Currently    Birth control/protection: Surgical  Other Topics Concern   Not on file  Social History Narrative   Lives with husband, daughter and grandchild local.   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   Social Drivers of Health   Financial Resource Strain: Low Risk  (08/13/2022)   Overall Financial Resource Strain (CARDIA)    Difficulty of Paying Living Expenses: Not hard at all  Food Insecurity: No Food Insecurity (08/25/2022)   Hunger Vital Sign    Worried About Running Out of Food in the Last Year: Never true    Ran Out of Food in the Last Year: Never true  Transportation Needs: No Transportation Needs (08/25/2022)   PRAPARE - Administrator, Civil Service (Medical): No    Lack of Transportation (Non-Medical): No  Physical Activity: Unknown (08/13/2022)    Exercise Vital Sign    Days of Exercise per Week: Patient declined    Minutes of Exercise per Session: Not on file  Stress: Patient Declined (08/13/2022)   [employer] of Occupational Health - Occupational Stress Questionnaire    Feeling of Stress : Patient declined  Social Connections: Moderately Integrated (08/13/2022)   Social Connection and Isolation Panel    Frequency of Communication with Friends and Family: More than three times a week    Frequency of Social Gatherings with Friends and Family: More than three times a week    Attends Religious Services: More than 4 times per year    Active Member of [employer] or Organizations: Yes    Attends Banker Meetings: More than 4 times per year    Marital Status: Widowed  Intimate Partner Violence: Not At Risk (08/19/2022)   Humiliation, Afraid, Rape, and Kick questionnaire    Fear of Current or Ex-Partner: No    Emotionally Abused: No    Physically Abused: No    Sexually Abused: No    Allergies  Allergen Reactions   Amlodipine  Rash   Prochlorperazine Edisylate Anaphylaxis    Compazine--- tongue swells and rash   Aspirin  Other (See Comments)    Nose bleeds. Cannot take NSAIDS    Cymbalta [Duloxetine Hcl] Diarrhea, Nausea And Vomiting and Other (See Comments)    Increased blood pressure   Pamelor  [Nortriptyline  Hcl] Diarrhea and Nausea Only    Increased Heart rate and BP    Current Outpatient Medications  Medication Sig Dispense Refill   Abatacept  (ORENCIA  IV) Inject 500 mg into the vein every 6 (six) weeks.     acetaminophen  (TYLENOL ) 500 MG tablet Take 1 tablet (500 mg total) by mouth every 6 (six) hours as needed (pain). 30 tablet 0   amoxicillin  (AMOXIL ) 500 MG capsule Take 1 capsule (500 mg total) by mouth 2 (two) times daily. (Patient not taking: Reported on 11/13/2023) 14 capsule 0   Biotin  1000 MCG tablet Take 1,000 mcg by mouth 2 (two) times daily.     carvedilol  (COREG ) 12.5 MG tablet Take 1 tablet (12.5 mg  total) by mouth 2 (two) times daily with a meal. (Patient not taking: Reported on 11/13/2023) 180 tablet 3   carvedilol  (COREG ) 6.25 MG tablet Take 6.25 mg by mouth 2 (two) times  daily.     cephALEXin  (KEFLEX ) 500 MG capsule Take 1 capsule (500 mg total) by mouth 2 (two) times daily. 14 capsule 0   denosumab  (PROLIA ) 60 MG/ML SOSY injection Inject 60 mg into the skin every 6 (six) months.     estradiol  (ESTRACE ) 0.1 MG/GM vaginal cream Place one gram vaginally up to three times a week as needed to maintain comfort 42.5 g 5   famotidine  (PEPCID ) 20 MG tablet TAKE 1 TABLET BY MOUTH EVERY DAY 90 tablet 3   gabapentin  (NEURONTIN ) 300 MG capsule Take 1 capsule (300 mg total) by mouth 3 (three) times daily. 270 capsule 1   HYDROcodone -acetaminophen  (NORCO/VICODIN) 5-325 MG tablet Take 1 tablet by mouth 2 (two) times daily as needed for moderate pain (pain score 4-6). 30 tablet 0   levothyroxine  (SYNTHROID ) 75 MCG tablet Take 1 tablet (75 mcg total) by mouth daily before breakfast. 90 tablet 1   losartan  (COZAAR ) 25 MG tablet TAKE 1 TABLET (25 MG TOTAL) BY MOUTH DAILY. 90 tablet 1   Multiple Vitamin (MULTIVITAMIN) tablet Take 1 tablet by mouth daily.     ofloxacin  (FLOXIN ) 0.3 % OTIC solution Place 5-10 drops into both ears daily. Use for 10 days 5 mL 0   ondansetron  (ZOFRAN ) 4 MG tablet Take 1 tablet (4 mg total) by mouth every 8 (eight) hours as needed for nausea or vomiting. 30 tablet 1   predniSONE  (DELTASONE ) 5 MG tablet Take 4 tabs po qd x 4 days, 3  tabs po qd x 4 days, 2  tabs po qd x 4 days, 1  tab po qd x 4 days 40 tablet 0   Probiotic Product (PROBIOTIC PO) Take 1 capsule by mouth daily.      traMADol  (ULTRAM ) 50 MG tablet TAKE 1/2-1 TABLET BY MOUTH EVERY 8 HOURS AS NEEDED. DO NOT COMBINE WITH OTHER PAIN MEDICATION 60 tablet 2   valACYclovir  (VALTREX ) 1000 MG tablet Take 1 tablet (1,000 mg total) by mouth 3 (three) times daily. (Patient not taking: Reported on 11/13/2023) 21 tablet 0   No current  facility-administered medications for this visit.    REVIEW OF SYSTEMS:  [X]  denotes positive finding, [ ]  denotes negative finding Cardiac  Comments:  Chest pain or chest pressure:    Shortness of breath upon exertion:    Short of breath when lying flat:    Irregular heart rhythm:        Vascular    Pain in calf, thigh, or hip brought on by ambulation:    Pain in feet at night that wakes you up from your sleep:     Blood clot in your veins:    Leg swelling:         Pulmonary    Oxygen at home:    Productive cough:     Wheezing:         Neurologic    Sudden weakness in arms or legs:     Sudden numbness in arms or legs:     Sudden onset of difficulty speaking or slurred speech:    Temporary loss of vision in one eye:  x Left eye blurry vision  Problems with dizziness:         Gastrointestinal    Blood in stool:     Vomited blood:         Genitourinary    Burning when urinating:     Blood in urine:        Psychiatric  Major depression:         Hematologic    Bleeding problems:    Problems with blood clotting too easily:        Skin    Rashes or ulcers:        Constitutional    Fever or chills:      PHYSICAL EXAM: There were no vitals filed for this visit.  GENERAL: The patient is a well-nourished female, in no acute distress. The vital signs are documented above. CARDIAC: There is a regular rate and rhythm.  VASCULAR:  Left temporal pulse palpable with tenderness Lesion in question to the left forehead looks better compared to picture from Dr. Dolphus PULMONARY: No respiratory distress. MUSCULOSKELETAL: There are no major deformities or cyanosis. NEUROLOGIC: No focal weakness or paresthesias are detected.   DATA:   N/a  Assessment/Plan:  89 y.o. female, with history of rheumatoid arthritis and Sjogren's that presents for evaluation of possible temporal artery biopsy.  Patient states last week she developed left-sided temporal tenderness with  headache as well as blurry vision in her left eye.  She has been seen by rheumatology by Dr. Dolphus who has started her on steroids.  She still has left temporal tenderness today.  She did have an elevated sed rate of 86 and CRP 29.    I think it would be very reasonable to pursue left temporal artery biopsy which I have recommended.  She has all the classic symptoms.  I have time tomorrow and will add this on to the OR at [facility].  This can be done under MAC with local from an anesthesia standpoint.  Risk benefits discussed.  Discussed this will provide pathology to determine if she needs long-term steroids.     Lonni DOROTHA Gaskins, MD [facility] [place] Office: [phone]

## 2023-11-17 NOTE — Telephone Encounter (Signed)
 Erskin Barefoot, I saw that you saw [name] in the office today.  Thank you for working her in for temporal artery biopsy tomorrow.  I spoke with the patient this evening.  She states that she has been having dull headache now and also some blurry vision.  Please increase her dose of prednisone  to 40 mg p.o. every morning until we get the temporal artery biopsy results.  She was initially not complaining of any headaches or vision changes.  Her ophthalmology exam was normal. Please contact me if you have any questions. Thank you

## 2023-11-17 NOTE — Progress Notes (Signed)
 Anesthesia Chart Review: Same day workup  89 year old female follows with cardiology for history of palpitations, moderate to severe mitral regurgitation, left bundle branch block, chronic combined heart failure, cardiomyopathy (felt secondary to dyssynchrony).  Echo 08/2022 showed LVEF 40 to 45%, grade 1 DD, normal RV systolic function, moderate to severe mitral regurgitation, mild aortic insufficiency.  Last seen by Dr. Pietro on 10/28/2023.  Recommended continue conservative management for her cardiomyopathy and moderate to severe mitral regurgitation.  No further testing as there is no plan for intervention.  Follows with oncology for history of multifocal bilateral non-small cell lung cancer s/p right upper lobectomy with wedge resection of the right middle lobe 2015.  She declined adjuvant systemic chemotherapy.  Recent imaging showed increase in the size and number of multifocal disease.  At last follow-up with Dr. Sherrod on 07/24/2022 the patient elected to continue with observation.  Follows with rheumatology for history of seronegative rheumatoid arthritis, Raynaud's.  Orencia  infusions currently on hold due to recurrent infections.  When last seen on 11/13/2023 she was noted to have swelling and tenderness in the left temporal region.  Per note, this was initially noted by Dr. Marcey at [facility] eye care on 11/12/2023 and she was started on prednisone  at that time. She was referred to vascular surgery for evaluation of possible temporal arteritis.    Other pertinent history includes PONV, moderate hiatal hernia noted on CT chest 08/20/2022, CKD 3 by labs, hypothyroidism.  BMP and CBC 11/11/2023 reviewed, GFR 43 consistent with history of CKD, mild anemia with hemoglobin 11.0, otherwise unremarkable.  Patient will need to have surgery evaluation.  EKG 04/11/2023: Sinus rhythm.  Rate 82. Atrial premature complex. Left atrial enlargement. Left bundle branch block  TTE 08/21/2022: 1. No vegetations or  evidence of SBE.   2. Left ventricular ejection fraction, by estimation, is 40 to 45%. The  left ventricle has mildly decreased function. The left ventricle  demonstrates global hypokinesis. The left ventricular internal cavity size  was severely dilated. Left ventricular  diastolic parameters are consistent with Grade I diastolic dysfunction  (impaired relaxation).   3. Right ventricular systolic function is normal. The right ventricular  size is normal. There is normal pulmonary artery systolic pressure.   4. Left atrial size was moderately dilated.   5. The mitral valve is abnormal. Moderate to severe mitral valve  regurgitation. No evidence of mitral stenosis.   6. The aortic valve is tricuspid. There is mild calcification of the  aortic valve. There is mild thickening of the aortic valve. Aortic valve  regurgitation is mild. Aortic valve sclerosis/calcification is present,  without any evidence of aortic  stenosis.   7. The inferior vena cava is normal in size with greater than 50%  respiratory variability, suggesting right atrial pressure of 3 mmHg.     Lynwood Geofm RIGGERS [facility] Short Stay Center/Anesthesiology Phone [phone] 11/17/2023 2:48 PM

## 2023-11-18 ENCOUNTER — Encounter (HOSPITAL_COMMUNITY): Payer: Self-pay | Admitting: Vascular Surgery

## 2023-11-18 ENCOUNTER — Encounter (HOSPITAL_COMMUNITY)
Admission: RE | Disposition: A | Discharge status: Home / Self Care | Payer: Self-pay | Source: Home / Self Care | Attending: Vascular Surgery

## 2023-11-18 ENCOUNTER — Ambulatory Visit (HOSPITAL_COMMUNITY)
Admission: RE | Admit: 2023-11-18 | Discharge: 2023-11-18 | Disposition: A | Discharge status: Home / Self Care | Attending: Vascular Surgery | Admitting: Vascular Surgery

## 2023-11-18 ENCOUNTER — Other Ambulatory Visit: Payer: Self-pay

## 2023-11-18 ENCOUNTER — Encounter (HOSPITAL_COMMUNITY): Payer: Self-pay | Admitting: Physician Assistant

## 2023-11-18 ENCOUNTER — Encounter: Payer: Self-pay | Admitting: Internal Medicine

## 2023-11-18 ENCOUNTER — Other Ambulatory Visit (HOSPITAL_COMMUNITY): Payer: Self-pay

## 2023-11-18 ENCOUNTER — Ambulatory Visit (HOSPITAL_BASED_OUTPATIENT_CLINIC_OR_DEPARTMENT_OTHER): Payer: Self-pay | Admitting: Physician Assistant

## 2023-11-18 ENCOUNTER — Ambulatory Visit (HOSPITAL_COMMUNITY): Payer: Self-pay | Admitting: Physician Assistant

## 2023-11-18 DIAGNOSIS — I5033 Acute on chronic diastolic (congestive) heart failure: Secondary | ICD-10-CM

## 2023-11-18 DIAGNOSIS — M316 Other giant cell arteritis: Secondary | ICD-10-CM | POA: Diagnosis not present

## 2023-11-18 DIAGNOSIS — H538 Other visual disturbances: Secondary | ICD-10-CM | POA: Diagnosis not present

## 2023-11-18 DIAGNOSIS — E039 Hypothyroidism, unspecified: Secondary | ICD-10-CM | POA: Diagnosis not present

## 2023-11-18 DIAGNOSIS — K219 Gastro-esophageal reflux disease without esophagitis: Secondary | ICD-10-CM | POA: Diagnosis not present

## 2023-11-18 DIAGNOSIS — I509 Heart failure, unspecified: Secondary | ICD-10-CM | POA: Diagnosis not present

## 2023-11-18 DIAGNOSIS — I429 Cardiomyopathy, unspecified: Secondary | ICD-10-CM | POA: Diagnosis not present

## 2023-11-18 DIAGNOSIS — I110 Hypertensive heart disease with heart failure: Secondary | ICD-10-CM

## 2023-11-18 DIAGNOSIS — R519 Headache, unspecified: Secondary | ICD-10-CM | POA: Diagnosis not present

## 2023-11-18 DIAGNOSIS — M3500 Sicca syndrome, unspecified: Secondary | ICD-10-CM | POA: Insufficient documentation

## 2023-11-18 DIAGNOSIS — Z85118 Personal history of other malignant neoplasm of bronchus and lung: Secondary | ICD-10-CM | POA: Insufficient documentation

## 2023-11-18 DIAGNOSIS — Z79899 Other long term (current) drug therapy: Secondary | ICD-10-CM | POA: Insufficient documentation

## 2023-11-18 DIAGNOSIS — I7300 Raynaud's syndrome without gangrene: Secondary | ICD-10-CM | POA: Diagnosis not present

## 2023-11-18 DIAGNOSIS — I5043 Acute on chronic combined systolic (congestive) and diastolic (congestive) heart failure: Secondary | ICD-10-CM | POA: Diagnosis not present

## 2023-11-18 HISTORY — DX: Heart failure, unspecified: I50.9

## 2023-11-18 HISTORY — PX: ARTERY BIOPSY: SHX891

## 2023-11-18 SURGERY — BIOPSY TEMPORAL ARTERY
Anesthesia: Monitor Anesthesia Care | Site: Head | Laterality: Left

## 2023-11-18 MED ORDER — PROPOFOL 10 MG/ML IV BOLUS
INTRAVENOUS | Status: DC | PRN
  Administered 2023-11-18: 10 mg via INTRAVENOUS
  Administered 2023-11-18: 15 mg via INTRAVENOUS
  Administered 2023-11-18: 10 mg via INTRAVENOUS

## 2023-11-18 MED ORDER — PREDNISONE 20 MG PO TABS
40.0000 mg | ORAL_TABLET | ORAL | Status: AC
  Administered 2023-11-18: 40 mg via ORAL
  Filled 2023-11-18: qty 2

## 2023-11-18 MED ORDER — LIDOCAINE HCL (PF) 1 % IJ SOLN
INTRAMUSCULAR | Status: DC | PRN
  Administered 2023-11-18: 7 mL

## 2023-11-18 MED ORDER — CEFAZOLIN SODIUM-DEXTROSE 2-4 GM/100ML-% IV SOLN
2.0000 g | INTRAVENOUS | Status: AC
  Administered 2023-11-18: 2 g via INTRAVENOUS
  Filled 2023-11-18: qty 100

## 2023-11-18 MED ORDER — CHLORHEXIDINE GLUCONATE 4 % EX SOLN: 60.0000 mL | Freq: Once | CUTANEOUS | Status: DC

## 2023-11-18 MED ORDER — LIDOCAINE HCL (PF) 1 % IJ SOLN
INTRAMUSCULAR | Status: AC
  Filled 2023-11-18: qty 30

## 2023-11-18 MED ORDER — 0.9 % SODIUM CHLORIDE (POUR BTL) OPTIME
TOPICAL | Status: DC | PRN
  Administered 2023-11-18: 1000 mL

## 2023-11-18 MED ORDER — SODIUM CHLORIDE 0.9 % IV SOLN: INTRAVENOUS | Status: DC

## 2023-11-18 MED ORDER — PROPOFOL 500 MG/50ML IV EMUL
INTRAVENOUS | Status: DC | PRN
  Administered 2023-11-18: 50 ug/kg/min via INTRAVENOUS

## 2023-11-18 MED ORDER — FENTANYL CITRATE (PF) 250 MCG/5ML IJ SOLN
INTRAMUSCULAR | Status: AC
  Filled 2023-11-18: qty 5

## 2023-11-18 MED ORDER — LIDOCAINE 2% (20 MG/ML) 5 ML SYRINGE
INTRAMUSCULAR | Status: DC | PRN
  Administered 2023-11-18: 40 mg via INTRAVENOUS

## 2023-11-18 MED ORDER — CHLORHEXIDINE GLUCONATE 0.12 % MT SOLN
OROMUCOSAL | Status: AC
  Filled 2023-11-18: qty 15

## 2023-11-18 MED ORDER — ACETAMINOPHEN 325 MG PO TABS
650.0000 mg | ORAL_TABLET | Freq: Once | ORAL | Status: AC
  Administered 2023-11-18: 650 mg via ORAL
  Filled 2023-11-18: qty 2

## 2023-11-18 MED ORDER — FENTANYL CITRATE (PF) 250 MCG/5ML IJ SOLN
INTRAMUSCULAR | Status: DC | PRN
  Administered 2023-11-18: 50 ug via INTRAVENOUS

## 2023-11-18 MED ORDER — PREDNISONE 20 MG PO TABS
40.0000 mg | ORAL_TABLET | Freq: Every day | ORAL | 0 refills | Status: DC
  Filled 2023-11-18: qty 60, 30d supply, fill #0

## 2023-11-18 SURGICAL SUPPLY — 36 items
BAG COUNTER SPONGE SURGICOUNT (BAG) ×2 IMPLANT
CANISTER SUCTION 3000ML PPV (SUCTIONS) ×2 IMPLANT
CLIP TI WIDE RED SMALL 6 (CLIP) ×2 IMPLANT
CNTNR URN SCR LID CUP LEK RST (MISCELLANEOUS) ×2 IMPLANT
COTTONBALL LRG STERILE PKG (GAUZE/BANDAGES/DRESSINGS) ×2 IMPLANT
COVER SURGICAL LIGHT HANDLE (MISCELLANEOUS) ×2 IMPLANT
DERMABOND ADVANCED .7 DNX12 (GAUZE/BANDAGES/DRESSINGS) ×2 IMPLANT
DRAPE OPHTHALMIC 77X100 STRL (CUSTOM PROCEDURE TRAY) ×2 IMPLANT
ELECT NDL BLADE 2-5/6 (NEEDLE) ×2 IMPLANT
ELECT NEEDLE BLADE 2-5/6 (NEEDLE) ×1 IMPLANT
ELECTRODE REM PT RTRN 9FT ADLT (ELECTROSURGICAL) ×2 IMPLANT
GAUZE 4X4 16PLY ~~LOC~~+RFID DBL (SPONGE) ×2 IMPLANT
GEL ULTRASOUND 8.5O AQUASONIC (MISCELLANEOUS) ×2 IMPLANT
GLOVE BIO SURGEON STRL SZ7.5 (GLOVE) ×2 IMPLANT
GLOVE BIOGEL PI IND STRL 8 (GLOVE) ×2 IMPLANT
GOWN STRL REUS W/ TWL LRG LVL3 (GOWN DISPOSABLE) ×2 IMPLANT
GOWN STRL REUS W/ TWL XL LVL3 (GOWN DISPOSABLE) ×2 IMPLANT
KIT BASIN OR (CUSTOM PROCEDURE TRAY) ×2 IMPLANT
KIT TURNOVER KIT B (KITS) ×2 IMPLANT
NDL HYPO 25GX1X1/2 BEV (NEEDLE) ×2 IMPLANT
NEEDLE HYPO 25GX1X1/2 BEV (NEEDLE) ×1 IMPLANT
PACK GENERAL/GYN (CUSTOM PROCEDURE TRAY) ×2 IMPLANT
PAD ARMBOARD POSITIONER FOAM (MISCELLANEOUS) ×4 IMPLANT
SOLN 0.9% NACL 1000 ML (IV SOLUTION) ×1 IMPLANT
SOLN 0.9% NACL POUR BTL 1000ML (IV SOLUTION) ×2 IMPLANT
SOLN STERILE WATER 1000 ML (IV SOLUTION) ×1 IMPLANT
SOLN STERILE WATER BTL 1000 ML (IV SOLUTION) ×2 IMPLANT
SPIKE FLUID TRANSFER (MISCELLANEOUS) ×2 IMPLANT
SUCTION TUBE FRAZIER 10FR DISP (SUCTIONS) ×2 IMPLANT
SUT MNCRL AB 4-0 PS2 18 (SUTURE) ×2 IMPLANT
SUT PROLENE 6 0 BV (SUTURE) IMPLANT
SUT SILK 3-0 18XBRD TIE 12 (SUTURE) IMPLANT
SUT VIC AB 3-0 SH 27X BRD (SUTURE) ×2 IMPLANT
SYR CONTROL 10ML LL (SYRINGE) ×2 IMPLANT
TOWEL GREEN STERILE (TOWEL DISPOSABLE) ×2 IMPLANT
VASCULAR TIE MINI RED 18IN STL (MISCELLANEOUS) ×2 IMPLANT

## 2023-11-18 NOTE — Anesthesia Postprocedure Evaluation (Signed)
 Anesthesia Post Note  Patient: [name]  Procedure(s) Performed: BIOPSY TEMPORAL ARTERY (Left: Head)     Patient location during evaluation: PACU Anesthesia Type: MAC Level of consciousness: awake and alert Pain management: pain level controlled Vital Signs Assessment: post-procedure vital signs reviewed and stable Respiratory status: spontaneous breathing, nonlabored ventilation and respiratory function stable Cardiovascular status: stable and blood pressure returned to baseline Postop Assessment: no apparent nausea or vomiting Anesthetic complications: no   No notable events documented.  Last Vitals:  Vitals:   11/18/23 1415 11/18/23 1430  BP: (!) 164/69 (!) 164/64  Pulse: 67 64  Resp: 16 18  Temp:  36.6 C  SpO2: 93% 92%    Last Pain:  Vitals:   11/18/23 1430  TempSrc:   PainSc: Asleep                 Garnette FORBES Skillern

## 2023-11-18 NOTE — Transfer of Care (Signed)
 Immediate Anesthesia Transfer of Care Note  Patient: [name]  Procedure(s) Performed: BIOPSY TEMPORAL ARTERY (Left: Head)  Patient Location: PACU  Anesthesia Type:MAC  Level of Consciousness: awake, alert , and oriented  Airway & Oxygen Therapy: Patient Spontanous Breathing and Patient connected to face mask oxygen  Post-op Assessment: Report given to RN and Post -op Vital signs reviewed and stable  Post vital signs: Reviewed and stable  Last Vitals:  Vitals Value Taken Time  BP 150/67 11/18/23 13:50  Temp 36.6 C 11/18/23 13:50  Pulse 65 11/18/23 13:52  Resp 16 11/18/23 13:52  SpO2 97 % 11/18/23 13:52  Vitals shown include unfiled device data.  Last Pain:  Vitals:   11/18/23 1100  TempSrc:   PainSc: 0-No pain         Complications: No notable events documented.

## 2023-11-18 NOTE — Progress Notes (Signed)
 According to the patient and her daughter the course of prednisone  has gone as such:   Thursday 11/12/23 patient took 60mg  Friday patient skipped dose Saturday 11/14/23-Tuesday 11/17/23 Patient took 20mg  each morning (Sat, Sun, Mon, Tue)  Patient and her daughter deny that she has taken any prednisone  today.   Give 40 mg prednisone  today in pre-op per Dr. Gretta. Confirmed administration of 40 mg prednisone  in pre-op was okay with anesthesia (Dr. Corinne).

## 2023-11-18 NOTE — Op Note (Signed)
 Date: November 18, 2023  Preoperative diagnosis: Concern for temporal arteritis  Postoperative diagnosis: Same  Procedure: Left temporal artery biopsy  Surgeon: Dr. Lonni DOROTHA Gaskins, MD  Assistant: OR staff  Indications: 89 year old female followed by Dr. Dolphus with rheumatology.  Last week she developed left-sided temporal tenderness with headaches and blurry vision.  Vascular surgery has been asked to evaluate for temporal artery biopsy and she presents today after risk-benefits discussed.  Findings: The left temporal artery was quite large and very scarred and diseased appearing.  We biopsied a 2.5 cm length segment of artery that was sent to pathology.  Anesthesia: MAC with local  Details: Patient was taken to the operating room after informed consent was obtained.  Placed on the operative table supine position.  I went ahead and marked out the temporal artery pulse along the left temporal region at the hairline.  This area was then prepped and draped in standard sterile fashion.  Antibiotics were given and timeout performed.  I then injected 7 mL of 1% lidocaine  without epinephrine  for a field block.  I then made an incision here with a 15 blade scalpel.  I used Bovie cautery to dissect down and got hemostasis.  I then used Metzenbaum scissors to dissect out the temporal artery and this was quite large and friable.  Ultimately after I dissected out about a 2-1/2 cm length of artery it was ligated with a right angle clamps and then I transected the specimen was passed off the field for pathology.  I then tied off each into the temporal artery with 3-0 silk suture and a small clip.  The wound was irrigated.  It was closed with 3-0 Vicryl 4-0 Monocryl and Dermabond.  Complication: None  Condition: Stable  Lonni DOROTHA Gaskins, MD [facility] [place] Office: [phone]   [name]

## 2023-11-18 NOTE — H&P (Signed)
 History and Physical Interval Note:  11/18/2023 12:01 PM  [name]  has presented today for surgery, with the diagnosis of L Temporal Arteritis.  The various methods of treatment have been discussed with the patient and family. After consideration of risks, benefits and other options for treatment, the patient has consented to  Procedure(s): BIOPSY TEMPORAL ARTERY (Left) as a surgical intervention.  The patient's history has been reviewed, patient examined, no change in status, stable for surgery.  I have reviewed the patient's chart and labs.  Questions were answered to the patient's satisfaction.    Left temporal artery biopsy.  [name]     Patient name: [name]        MRN: [id number]        DOB: Jan 31, 1931          Sex: female   REASON FOR CONSULT: Temporal artery biopsy   HPI: [name] is a 89 y.o. female, with history of rheumatoid arthritis and Sjogren's that presents for evaluation of possible temporal artery biopsy.  Patient states last week she developed left-sided temporal tenderness with headache as well as blurry vision in her left eye.  She has been seen by rheumatology by Dr. Dolphus who has started her on steroids.  She still has left temporal tenderness today.  She did have an elevated sed rate of 86 and CRP 29.     She is known to our practice and had a prior right femoral artery pseudoaneurysm with right popliteal artery embolus requiring embolectomy and femoral endarterectomy with Dr. Eliza in 2020.         Past Medical History:  Diagnosis Date   Arthralgia of multiple joints      followed by dr ziolkowska   Arthritis     C. difficile colitis 08/2022   Cardiomyopathy (HCC)     Chronic constipation     Chronic inflammatory arthritis      rhemotolgist-  dr a. ziolkowska ([facility])   Dry eyes      eye drops used    GERD (gastroesophageal reflux disease)     H/O discoid lupus erythematosus     Hiatal hernia     History  of colon polyps     Hypothyroidism     Iron deficiency anemia     LBBB (left bundle branch block) 2010   Mitchell's disease (erythromelalgia)      neurologist-  dr patel   Nocturia     Non-small cell cancer of right lung [facility]) surgeon-- dr gerhardt/  oncologist-  dr sherrod--- per lov notes no recurrence/   11-18-2017 per pt denies any symptoms    dx 2015--  Stage IIA (T2b,N0,M0) , +EGFR  mutation in exon 21, non-small cell adenocarcinoma right upper lobe---  s/p  Right upper lobectomy , right middley wedge resection and node dissection---  no chemo or radiation therapy   OA (osteoarthritis)      hands   Osteoporosis     PONV (postoperative nausea and vomiting)      likes phenergan    Raynaud's phenomenon 1965   Renal insufficiency     Rheumatoid arthritis (HCC)     Sciatica     Scoliosis     Sjogren's syndrome                 Past Surgical History:  Procedure Laterality Date   ANTERIOR HIP REVISION Right 11/27/2017    Procedure: RIGHT HIP ACETABULAR REVISION;  Surgeon: Vernetta,  Raven GRADE, MD;  Location: [facility] ORS;  Service: Orthopedics;  Laterality: Right;   ANTERIOR HIP REVISION Right 01/24/2018    Procedure: OPEN REDUCTION OF DISLOCATED ANTERIOR HIP WITH REVISION OF LINER AND HIP BALL;  Surgeon: Vernetta Raven GRADE, MD;  Location: [facility];  Service: Orthopedics;  Laterality: Right;   APPENDECTOMY   1950s   BIOPSY   04/14/2018    Procedure: BIOPSY;  Surgeon: Leigh Elspeth SQUIBB, MD;  Location: [facility] ENDOSCOPY;  Service: Gastroenterology;;   BIOPSY   04/16/2018    Procedure: BIOPSY;  Surgeon: Wilhelmenia Aloha Raddle., MD;  Location: [facility] ENDOSCOPY;  Service: Gastroenterology;;   CARDIOVASCULAR STRESS TEST   12/2008     mild fixed basal to mid septal perfusion defect felt likely due to artifact from LBBB, no ischemia, EF 58%   COLONOSCOPY       COLONOSCOPY WITH PROPOFOL  N/A 04/16/2018    Procedure: COLONOSCOPY WITH PROPOFOL ;  Surgeon: Wilhelmenia Aloha Raddle., MD;  Location: [facility]  ENDOSCOPY;  Service: Gastroenterology;  Laterality: N/A;   ESOPHAGOGASTRODUODENOSCOPY (EGD) WITH PROPOFOL  N/A 04/14/2018    Procedure: ESOPHAGOGASTRODUODENOSCOPY (EGD) WITH PROPOFOL ;  Surgeon: Leigh Elspeth SQUIBB, MD;  Location: [facility] ENDOSCOPY;  Service: Gastroenterology;  Laterality: N/A;   FEMORAL-POPLITEAL BYPASS GRAFT Right 04/10/2018    Procedure: REPAIR RIGHT FEMORAL ARTERY PSEUDOANEURYSM, RETROPERITONEAL EXPOSURE OF ILIAC ARTERY, RIGHT POPLITEAL EMBOLECTOMY;  Surgeon: Eliza Raven RAMAN, MD;  Location: [facility] OR;  Service: Vascular;  Laterality: Right;   FLEXIBLE SIGMOIDOSCOPY N/A 06/17/2019    Procedure: FLEXIBLE SIGMOIDOSCOPY;  Surgeon: San Sandor GAILS, DO;  Location: [facility] ENDOSCOPY;  Service: Gastroenterology;  Laterality: N/A;   HEMOSTASIS CLIP PLACEMENT   06/17/2019    Procedure: HEMOSTASIS CLIP PLACEMENT;  Surgeon: San Sandor GAILS, DO;  Location: [facility] ENDOSCOPY;  Service: Gastroenterology;;   LYMPH NODE DISSECTION Right 06/07/2013    Procedure: LYMPH NODE DISSECTION;  Surgeon: Dallas KATHEE Jude, MD;  Location: [facility] OR;  Service: Thoracic;  Laterality: Right;   PATCH ANGIOPLASTY Right 04/10/2018    Procedure: PATCH  ANGIOPLASTY OF RIGHT FEMORAL ARTERY USING BOVINE PATCH, PATCH ANGIOPLASTY OF RIGHT POPLITEAL ARTERY USING BOVINE PATCH;  Surgeon: Eliza Raven RAMAN, MD;  Location: [facility] OR;  Service: Vascular;  Laterality: Right;   SCHLEROTHERAPY   06/17/2019    Procedure: WALDEMAR OF VARICES;  Surgeon: San Sandor GAILS, DO;  Location: [facility] ENDOSCOPY;  Service: Gastroenterology;;   THORACIC SYMPATHETECTOMY   1965    large incision from chest to up to shoulder, the nerves were tied together, for raynaud's   THORACOTOMY   06/07/2013    Procedure: MINI/LIMITED THORACOTOMY; right middle lobe wedge resection;  Surgeon: Dallas KATHEE Jude, MD;  Location: [facility] OR;  Service: Thoracic;;   TONSILLECTOMY   child   TOTAL ABDOMINAL HYSTERECTOMY   1980's     W/ BSO   TOTAL HIP ARTHROPLASTY Right  04/28/2014    Procedure: RIGHT TOTAL HIP ARTHROPLASTY ANTERIOR APPROACH;  Surgeon: Raven GRADE Vernetta, MD;  Location: [facility]S;  Service: Orthopedics;  Laterality: Right;   TRANSTHORACIC ECHOCARDIOGRAM   12/11/2008    ef 45-50%, grade 1 diastolic dysfunction/  mild LAE/  mild AR and MR/  trivial TR   VIDEO ASSISTED THORACOSCOPY (VATS)/WEDGE RESECTION Right 06/07/2013    Procedure: VIDEO ASSISTED THORACOSCOPY (VATS)/right upper lobectomy, On Q;  Surgeon: Dallas KATHEE Jude, MD;  Location: [facility] OR;  Service: Thoracic;  Laterality: Right;   VIDEO BRONCHOSCOPY N/A 06/07/2013    Procedure: VIDEO BRONCHOSCOPY;  Surgeon: Dallas KATHEE Jude, MD;  Location: [facility] OR;  Service: Thoracic;  Laterality: N/A;   VIDEO BRONCHOSCOPY WITH ENDOBRONCHIAL NAVIGATION N/A 05/04/2013    Procedure: VIDEO BRONCHOSCOPY WITH ENDOBRONCHIAL NAVIGATION;  Surgeon: Dallas KATHEE Jude, MD;  Location: [facility] OR;  Service: Thoracic;  Laterality: N/A;               Family History  Problem Relation Age of Onset   Other Mother 24        MVA   Coronary artery disease Father     Colon cancer Father     Diabetes Father     Cancer Father          colon   Healthy Sister     Pneumonia Sister     Healthy Brother     Other Brother          killed in war   Healthy Daughter     Hypothyroidism Daughter     Healthy Daughter     Esophageal cancer Neg Hx     Kidney disease Neg Hx     Liver disease Neg Hx            SOCIAL HISTORY: Social History         Socioeconomic History   Marital status: Widowed      Spouse name: Not on file   Number of children: 2   Years of education: Not on file   Highest education level: Master's degree (e.g., MA, MS, MEng, MEd, MSW, MBA)  Occupational History   Occupation: n/a  Tobacco Use   Smoking status: Never      Passive exposure: Past   Smokeless tobacco: Never  Vaping Use   Vaping status: Never Used  Substance and Sexual Activity   Alcohol  use: Not Currently   Drug use: Never   Sexual  activity: Not Currently      Birth control/protection: Surgical  Other Topics Concern   Not on file  Social History Narrative    Lives with husband, daughter and grandchild local.    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    Social Drivers of Health        Financial Resource Strain: Low Risk  (08/13/2022)    Overall Financial Resource Strain (CARDIA)     Difficulty of Paying Living Expenses: Not hard at all  Food Insecurity: No Food Insecurity (08/25/2022)    Hunger Vital Sign     Worried About Running Out of Food in the Last Year: Never true     Ran Out of Food in the Last Year: Never true  Transportation Needs: No Transportation Needs (08/25/2022)    PRAPARE - Therapist, art (Medical): No     Lack of Transportation (Non-Medical): No  Physical Activity: Unknown (08/13/2022)    Exercise Vital Sign     Days of Exercise per Week: Patient declined     Minutes of Exercise per Session: Not on file  Stress: Patient Declined (08/13/2022)    [employer] of Occupational Health - Occupational Stress Questionnaire     Feeling of Stress : Patient declined  Social Connections: Moderately Integrated (08/13/2022)    Social Connection and Isolation Panel     Frequency of Communication with Friends and Family: More than three times a week     Frequency of Social Gatherings with Friends and Family: More than three times a week     Attends Religious Services: More than 4 times per year     Active  Member of Clubs or Organizations: Yes     Attends Engineer, structural: More than 4 times per year     Marital Status: Widowed  Intimate Partner Violence: Not At Risk (08/19/2022)    Humiliation, Afraid, Rape, and Kick questionnaire     Fear of Current or Ex-Partner: No     Emotionally Abused: No     Physically Abused: No     Sexually Abused: No      Allergies       Allergies  Allergen Reactions   Amlodipine  Rash    Prochlorperazine Edisylate Anaphylaxis      Compazine--- tongue swells and rash   Aspirin  Other (See Comments)      Nose bleeds. Cannot take NSAIDS    Cymbalta [Duloxetine Hcl] Diarrhea, Nausea And Vomiting and Other (See Comments)      Increased blood pressure   Pamelor  [Nortriptyline  Hcl] Diarrhea and Nausea Only      Increased Heart rate and BP              Current Outpatient Medications  Medication Sig Dispense Refill   Abatacept  (ORENCIA  IV) Inject 500 mg into the vein every 6 (six) weeks.       acetaminophen  (TYLENOL ) 500 MG tablet Take 1 tablet (500 mg total) by mouth every 6 (six) hours as needed (pain). 30 tablet 0   amoxicillin  (AMOXIL ) 500 MG capsule Take 1 capsule (500 mg total) by mouth 2 (two) times daily. (Patient not taking: Reported on 11/13/2023) 14 capsule 0   Biotin  1000 MCG tablet Take 1,000 mcg by mouth 2 (two) times daily.       carvedilol  (COREG ) 12.5 MG tablet Take 1 tablet (12.5 mg total) by mouth 2 (two) times daily with a meal. (Patient not taking: Reported on 11/13/2023) 180 tablet 3   carvedilol  (COREG ) 6.25 MG tablet Take 6.25 mg by mouth 2 (two) times daily.       cephALEXin  (KEFLEX ) 500 MG capsule Take 1 capsule (500 mg total) by mouth 2 (two) times daily. 14 capsule 0   denosumab  (PROLIA ) 60 MG/ML SOSY injection Inject 60 mg into the skin every 6 (six) months.       estradiol  (ESTRACE ) 0.1 MG/GM vaginal cream Place one gram vaginally up to three times a week as needed to maintain comfort 42.5 g 5   famotidine  (PEPCID ) 20 MG tablet TAKE 1 TABLET BY MOUTH EVERY DAY 90 tablet 3   gabapentin  (NEURONTIN ) 300 MG capsule Take 1 capsule (300 mg total) by mouth 3 (three) times daily. 270 capsule 1   HYDROcodone -acetaminophen  (NORCO/VICODIN) 5-325 MG tablet Take 1 tablet by mouth 2 (two) times daily as needed for moderate pain (pain score 4-6). 30 tablet 0   levothyroxine  (SYNTHROID ) 75 MCG tablet Take 1 tablet (75 mcg total) by mouth daily before breakfast. 90 tablet  1   losartan  (COZAAR ) 25 MG tablet TAKE 1 TABLET (25 MG TOTAL) BY MOUTH DAILY. 90 tablet 1   Multiple Vitamin (MULTIVITAMIN) tablet Take 1 tablet by mouth daily.       ofloxacin  (FLOXIN ) 0.3 % OTIC solution Place 5-10 drops into both ears daily. Use for 10 days 5 mL 0   ondansetron  (ZOFRAN ) 4 MG tablet Take 1 tablet (4 mg total) by mouth every 8 (eight) hours as needed for nausea or vomiting. 30 tablet 1   predniSONE  (DELTASONE ) 5 MG tablet Take 4 tabs po qd x 4 days, 3  tabs po qd x 4 days, 2  tabs po qd x 4 days, 1  tab po qd x 4 days 40 tablet 0   Probiotic Product (PROBIOTIC PO) Take 1 capsule by mouth daily.        traMADol  (ULTRAM ) 50 MG tablet TAKE 1/2-1 TABLET BY MOUTH EVERY 8 HOURS AS NEEDED. DO NOT COMBINE WITH OTHER PAIN MEDICATION 60 tablet 2   valACYclovir  (VALTREX ) 1000 MG tablet Take 1 tablet (1,000 mg total) by mouth 3 (three) times daily. (Patient not taking: Reported on 11/13/2023) 21 tablet 0      No current facility-administered medications for this visit.        REVIEW OF SYSTEMS:  [X]  denotes positive finding, [ ]  denotes negative finding Cardiac   Comments:  Chest pain or chest pressure:      Shortness of breath upon exertion:      Short of breath when lying flat:      Irregular heart rhythm:             Vascular      Pain in calf, thigh, or hip brought on by ambulation:      Pain in feet at night that wakes you up from your sleep:       Blood clot in your veins:      Leg swelling:              Pulmonary      Oxygen at home:      Productive cough:       Wheezing:              Neurologic      Sudden weakness in arms or legs:       Sudden numbness in arms or legs:       Sudden onset of difficulty speaking or slurred speech:      Temporary loss of vision in one eye:  x Left eye blurry vision  Problems with dizziness:              Gastrointestinal      Blood in stool:       Vomited blood:              Genitourinary      Burning when urinating:        Blood in urine:             Psychiatric      Major depression:              Hematologic      Bleeding problems:      Problems with blood clotting too easily:             Skin      Rashes or ulcers:             Constitutional      Fever or chills:          PHYSICAL EXAM: There were no vitals filed for this visit.   GENERAL: The patient is a well-nourished female, in no acute distress. The vital signs are documented above. CARDIAC: There is a regular rate and rhythm.  VASCULAR:  Left temporal pulse palpable with tenderness Lesion in question to the left forehead looks better compared to picture from Dr. Dolphus PULMONARY: No respiratory distress. MUSCULOSKELETAL: There are no major deformities or cyanosis. NEUROLOGIC: No focal weakness or paresthesias are detected.     DATA:    N/a   Assessment/Plan:   89 y.o. female, with history of rheumatoid arthritis  and Sjogren's that presents for evaluation of possible temporal artery biopsy.  Patient states last week she developed left-sided temporal tenderness with headache as well as blurry vision in her left eye.  She has been seen by rheumatology by Dr. Dolphus who has started her on steroids.  She still has left temporal tenderness today.  She did have an elevated sed rate of 86 and CRP 29.     I think it would be very reasonable to pursue left temporal artery biopsy which I have recommended.  She has all the classic symptoms.  I have time tomorrow and will add this on to the OR at [facility].  This can be done under MAC with local from an anesthesia standpoint.  Risk benefits discussed.  Discussed this will provide pathology to determine if she needs long-term steroids.       Raven DOROTHA Gaskins, MD [facility] Office: [phone]

## 2023-11-19 ENCOUNTER — Encounter (HOSPITAL_COMMUNITY): Payer: Self-pay | Admitting: Vascular Surgery

## 2023-11-20 ENCOUNTER — Telehealth: Payer: Self-pay

## 2023-11-20 ENCOUNTER — Telehealth (INDEPENDENT_AMBULATORY_CARE_PROVIDER_SITE_OTHER): Payer: Self-pay | Admitting: Vascular Surgery

## 2023-11-20 ENCOUNTER — Inpatient Hospital Stay (HOSPITAL_COMMUNITY): Admission: RE | Admit: 2023-11-20 | Source: Ambulatory Visit

## 2023-11-20 ENCOUNTER — Ambulatory Visit: Payer: Self-pay | Admitting: Family Medicine

## 2023-11-20 ENCOUNTER — Encounter: Payer: Self-pay | Admitting: Family Medicine

## 2023-11-20 DIAGNOSIS — N390 Urinary tract infection, site not specified: Secondary | ICD-10-CM

## 2023-11-20 LAB — SURGICAL PATHOLOGY

## 2023-11-20 NOTE — Telephone Encounter (Addendum)
 I called to check on the patient as she is 2 days post left temporal artery biopsy.  Patient states she is a little fuzzy from the fentanyl  but her headaches have improved overall. She is only experiencing mild/dull headaches now. She has dissolvable stitches at the incision site. Patient states she is currently taking antibiotics for the UTI, which is improving. Patient reports she is taking the prednisone  taper per instructions given on 11/13/2023.   Patient states she will be following up with Dr. Gretta when the biopsy results come back.  I advised the patient we would looking out for the report as well.   I called Patient's daughter to confirm prednisone  dose and she reports that patient is taking Prednisone  40mg  daily. Patient's daughter asked about who the patient should see for her vision, Dr. Marcey or Dr. Katheleen. I spoke with Dr. Dolphus and patient can see either but should follow up for her vision. Patient's daughter states that Dr. Katheleen will follow the patient's vision very closely.    I called Dr. Marcey at [facility] to inform of temporal artery biopsy results. He was appreciative of the coordination of care. I advised we will be seeing the patient on 11/24/2023 to follow up.   Dr. Dolphus received biopsy results from Dr. Gretta and temporal artery results are positive. Dr. Gretta contacted the patient with results. I called the patient as well and she is scheduled for 11/24/2023 to see Dr. Dolphus.

## 2023-11-20 NOTE — Telephone Encounter (Signed)
 Called [name] who seems to be doing well at home after biopsy.  Notified her that the pathology results were positive for temporal arteritis.  I have forwarded the results to her rheumatologist Dr. Dolphus and PCP.  She is on steroids currently at 40 mg daily prednisone  directed by Dr. Dolphus.  Raven DOROTHA Gaskins, MD [facility] Office: [phone]   [name]

## 2023-11-20 NOTE — Progress Notes (Signed)
 Yes, thank you I have been in touch with Dr. Gretta.  We decided to discharge patient on prednisone  40 mg p.o. daily.  I will see her next week.  Thank you .

## 2023-11-20 NOTE — Progress Notes (Signed)
 Office Visit Note  Patient: [name]             Date of Birth: 25-Jan-1931           MRN: [id number]             PCP: Watt Harlene BROCKS, MD Referring: Watt Harlene BROCKS, MD Visit Date: 11/24/2023 Occupation: Data Unavailable  Subjective:  Temporal arteritis  History of Present Illness: [name] is a 89 y.o. female with seronegative rheumatoid arthritis, temporal arteritis and osteoporosis.  She returns today after her last visit in September 2025.  She had a temporal artery biopsy on October in 2025.  The pathology was positive for temporal arteritis.  She has been on prednisone  40 mg p.o. daily since November 12, 2023.  She states her headaches improved on prednisone .  She has been experiencing some tenderness over the right temporal region.  She has not experienced any vision changes.  Continues to have some discomfort from rheumatoid arthritis and osteoarthritis which she describes in her shoulders, her hands and feet.  She has been ambulating with the help of a walker.  She had a urinary tract infection on November 13, 2023.  The urine culture was positive for Klebsiella pneumoniae.  She was treated with cephalexin  which she finished on November 23, 2023.  She has been experiencing diarrhea for the last week.  She states she has been having loose stools and even having to get up at night.    Activities of Daily Living:  Patient reports morning stiffness for all day. Patient Reports nocturnal pain.  Difficulty dressing/grooming: Reports Difficulty climbing stairs: Reports Difficulty getting out of chair: Reports Difficulty using hands for taps, buttons, cutlery, and/or writing: Reports  Review of Systems  Constitutional:  Negative for fatigue.  HENT:  Positive for mouth dryness. Negative for mouth sores.   Eyes:  Positive for dryness. Negative for pain and visual disturbance.  Respiratory:  Negative for shortness of breath.   Cardiovascular:  Negative for chest  pain and palpitations.  Gastrointestinal:  Negative for blood in stool, constipation and diarrhea.  Endocrine: Negative for increased urination.  Genitourinary:  Positive for involuntary urination and nocturia.  Musculoskeletal:  Positive for joint pain, gait problem, joint pain, joint swelling, myalgias, muscle weakness, morning stiffness, muscle tenderness and myalgias.  Skin:  Positive for color change and hair loss. Negative for rash and sensitivity to sunlight.  Allergic/Immunologic: Negative for susceptible to infections.  Neurological:  Positive for numbness and headaches. Negative for dizziness.  Hematological:  Positive for swollen glands.  Psychiatric/Behavioral:  Positive for sleep disturbance. Negative for depressed mood. The patient is not nervous/anxious.     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   HTN (hypertension) 04/10/2018   Femoral artery pseudo-aneurysm, right 04/09/2018   Anemia of chronic disease 02/24/2018   Unstable right hip arthroplasty 01/24/2018   History of revision of total replacement of right  hip joint 01/24/2018   Hypovolemic shock (HCC)    Hyperkalemia    Hyponatremia    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   Status post revision of total hip 11/27/2017   Elevated cholesterol 10/11/2015   Adrenal gland hyperfunction 10/04/2014   Bilateral leg edema 08/01/2014   Elevated BP  08/01/2014   Rheumatoid arthritis involving multiple joints (HCC) 05/30/2014   Status post total replacement of right hip 04/28/2014   Long-term use of high-risk medication 11/11/2013   Symptomatic anemia 11/11/2013   Neuropathic pain 07/07/2013   Constipation due to pain medication 07/07/2013   Protein-calorie malnutrition, severe (HCC) 06/08/2013   Lung cancer, Right upper lobe 05/08/2013   Sciatica of right side 08/13/2011   Osteoporosis 03/07/2010   PARESTHESIA 03/07/2010   Low back pain 03/16/2009   CT, CHEST, ABNORMAL 12/18/2008   ABNORMAL ECHOCARDIOGRAM 12/14/2008   SJOGREN'S SYNDROME 11/29/2008   HYPOGLYCEMIA 06/29/2006   RAYNAUD'S DISEASE 06/29/2006    Past Medical History:  Diagnosis Date   Arthralgia of multiple joints    followed by dr thamas   Arthritis    C. difficile colitis 08/2022   Cardiomyopathy (HCC)    CHF (congestive heart failure) (HCC)    Chronic constipation    Chronic inflammatory arthritis    rhemotolgist-  dr a. ziolkowska ([facility])   Dry eyes    eye drops used    GERD (gastroesophageal reflux disease)    H/O discoid lupus erythematosus    Hiatal hernia    History of colon polyps    Hypothyroidism    Iron deficiency anemia    LBBB (left bundle branch block) 2010   Mitchell's disease (erythromelalgia)    neurologist-  dr patel   Nocturia    Non-small cell cancer of right lung [facility]) surgeon-- dr gerhardt/  oncologist-  dr sherrod--- per lov notes no recurrence/   11-18-2017 per pt denies any symptoms   dx 2015--  Stage IIA (T2b,N0,M0) , +EGFR  mutation in exon 21, non-small cell adenocarcinoma right upper lobe---  s/p  Right upper lobectomy , right middley wedge resection and node dissection---  no chemo or radiation therapy   OA (osteoarthritis)    hands   Osteoporosis    PONV (postoperative nausea and vomiting)    likes phenergan    Raynaud's phenomenon 1965   Renal insufficiency    Rheumatoid arthritis (HCC)    Sciatica     Scoliosis    Sjogren's syndrome     Family History  Problem Relation Age of Onset   Other Mother 32       MVA   Coronary artery disease Father    Colon cancer Father    Diabetes Father    Cancer Father        colon   Healthy Sister    Pneumonia Sister    Healthy Brother    Other Brother        killed in war   Healthy Daughter    Hypothyroidism Daughter    Healthy Daughter    Esophageal cancer Neg Hx    Kidney disease Neg Hx    Liver disease Neg Hx    Past Surgical History:  Procedure Laterality Date   ANTERIOR HIP REVISION Right 11/27/2017   Procedure: RIGHT HIP ACETABULAR REVISION;  Surgeon: Vernetta Lonni GRADE, MD;  Location: [facility];  Service: Orthopedics;  Laterality: Right;   ANTERIOR HIP REVISION Right 01/24/2018   Procedure: OPEN REDUCTION OF DISLOCATED ANTERIOR HIP WITH REVISION OF  LINER AND HIP BALL;  Surgeon: Vernetta Lonni GRADE, MD;  Location: [facility];  Service: Orthopedics;  Laterality: Right;   APPENDECTOMY  1950s   ARTERY BIOPSY Left 11/18/2023   Procedure: BIOPSY TEMPORAL ARTERY;  Surgeon: Gretta Lonni PARAS, MD;  Location: [facility] OR;  Service: Vascular;  Laterality: Left;   BIOPSY  04/14/2018   Procedure: BIOPSY;  Surgeon: Leigh Elspeth SQUIBB, MD;  Location: [facility];  Service: Gastroenterology;;   BIOPSY  04/16/2018   Procedure: BIOPSY;  Surgeon: Wilhelmenia Aloha Raddle., MD;  Location: [facility] ENDOSCOPY;  Service: Gastroenterology;;   CARDIOVASCULAR STRESS TEST  12/2008    mild fixed basal to mid septal perfusion defect felt likely due to artifact from LBBB, no ischemia, EF 58%   COLONOSCOPY     COLONOSCOPY WITH PROPOFOL  N/A 04/16/2018   Procedure: COLONOSCOPY WITH PROPOFOL ;  Surgeon: Wilhelmenia Aloha Raddle., MD;  Location: [facility] ENDOSCOPY;  Service: Gastroenterology;  Laterality: N/A;   ESOPHAGOGASTRODUODENOSCOPY (EGD) WITH PROPOFOL  N/A 04/14/2018   Procedure: ESOPHAGOGASTRODUODENOSCOPY (EGD) WITH PROPOFOL ;  Surgeon: Leigh Elspeth SQUIBB, MD;  Location: [facility]  ENDOSCOPY;  Service: Gastroenterology;  Laterality: N/A;   FEMORAL-POPLITEAL BYPASS GRAFT Right 04/10/2018   Procedure: REPAIR RIGHT FEMORAL ARTERY PSEUDOANEURYSM, RETROPERITONEAL EXPOSURE OF ILIAC ARTERY, RIGHT POPLITEAL EMBOLECTOMY;  Surgeon: Eliza Lonni RAMAN, MD;  Location: [facility] OR;  Service: Vascular;  Laterality: Right;   FLEXIBLE SIGMOIDOSCOPY N/A 06/17/2019   Procedure: FLEXIBLE SIGMOIDOSCOPY;  Surgeon: San Sandor GAILS, DO;  Location: [facility];  Service: Gastroenterology;  Laterality: N/A;   HEMOSTASIS CLIP PLACEMENT  06/17/2019   Procedure: HEMOSTASIS CLIP PLACEMENT;  Surgeon: San Sandor GAILS, DO;  Location: [facility];  Service: Gastroenterology;;   LYMPH NODE DISSECTION Right 06/07/2013   Procedure: LYMPH NODE DISSECTION;  Surgeon: Dallas KATHEE Jude, MD;  Location: [facility] OR;  Service: Thoracic;  Laterality: Right;   PATCH ANGIOPLASTY Right 04/10/2018   Procedure: PATCH  ANGIOPLASTY OF RIGHT FEMORAL ARTERY USING BOVINE PATCH, PATCH ANGIOPLASTY OF RIGHT POPLITEAL ARTERY USING BOVINE PATCH;  Surgeon: Eliza Lonni RAMAN, MD;  Location: [facility] OR;  Service: Vascular;  Laterality: Right;   SCHLEROTHERAPY  06/17/2019   Procedure: WALDEMAR OF VARICES;  Surgeon: San Sandor GAILS, DO;  Location: [facility] END[facility];  Service: Gastroenterology;;   THORACIC SYMPATHETECTOMY  1965   large incision from chest to up to shoulder, the nerves were tied together, for raynaud's   THORACOTOMY  06/07/2013   Procedure: MINI/LIMITED THORACOTOMY; right middle lobe wedge resection;  Surgeon: Dallas KATHEE Jude, MD;  Location: [facility] OR;  Service: Thoracic;;   TONSILLECTOMY  child   TOTAL ABDOMINAL HYSTERECTOMY  1980's    W/ BSO   TOTAL HIP ARTHROPLASTY Right 04/28/2014   Procedure: RIGHT TOTAL HIP ARTHROPLASTY ANTERIOR APPROACH;  Surgeon: Lonni GRADE Vernetta, MD;  Location: [facility];  Service: Orthopedics;  Laterality: Right;   TRANSTHORACIC ECHOCARDIOGRAM  12/11/2008   ef 45-50%, grade 1 diastolic  dysfunction/  mild LAE/  mild AR and MR/  trivial TR   VIDEO ASSISTED THORACOSCOPY (VATS)/WEDGE RESECTION Right 06/07/2013   Procedure: VIDEO ASSISTED THORACOSCOPY (VATS)/right upper lobectomy, On Q;  Surgeon: Dallas KATHEE Jude, MD;  Location: [facility] OR;  Service: Thoracic;  Laterality: Right;   VIDEO BRONCHOSCOPY N/A 06/07/2013   Procedure: VIDEO BRONCHOSCOPY;  Surgeon: Dallas KATHEE Jude, MD;  Location: [facility] OR;  Service: Thoracic;  Laterality: N/A;   VIDEO BRONCHOSCOPY WITH ENDOBRONCHIAL NAVIGATION N/A 05/04/2013   Procedure: VIDEO BRONCHOSCOPY WITH ENDOBRONCHIAL NAVIGATION;  Surgeon: Dallas KATHEE Jude, MD;  Location: [facility] OR;  Service: Thoracic;  Laterality: N/A;   Social History   Tobacco Use   Smoking status: Never    Passive exposure: Past   Smokeless tobacco: Never  Vaping Use   Vaping status: Never Used  Substance Use Topics   Alcohol  use: Not Currently   Drug use: Never   Social History   Social History Narrative   Lives with husband, daughter and grandchild local.   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 History  Administered Date(s) Administered   Fluad Quad(high Dose 65+) 11/03/2018, 12/14/2019   Fluad Trivalent(High Dose 65+) 01/21/2023   Fluzone Influenza virus vaccine,trivalent (IIV3), split virus 11/14/2015   INFLUENZA, HIGH DOSE SEASONAL PF 10/30/2012, 01/02/2015, 11/13/2016, 11/12/2017   Influenza Split 11/21/2011   Influenza Whole 11/29/2007, 11/29/2008, 11/29/2009   Influenza,inj,Quad PF,6+ Mos 11/22/2013, 11/14/2015   Influenza-Unspecified 11/13/2016, 11/12/2017, 12/14/2020   PFIZER(Purple Top)SARS-COV-2 Vaccination 04/12/2019, 05/03/2019, 01/20/2020, 12/14/2020   Pneumococcal Conjugate-13 05/22/2015   Pneumococcal Polysaccharide-23 06/13/2013   Tdap 08/17/2017   Zoster, Live 01/27/2014     Objective: Vital Signs: BP (!) 158/74 (BP Location: Left Arm, Patient Position: Sitting, Cuff Size: Normal)   Pulse 75    Temp (!) 97.2 F (36.2 C)   Resp 13   Ht 5' (1.524 m)   Wt 109 lb 12.8 oz (49.8 kg)   BMI 21.44 kg/m    Physical Exam Vitals and nursing note reviewed.  Constitutional:      Appearance: She is well-developed.  HENT:     Head: Normocephalic and atraumatic.  Eyes:     Conjunctiva/sclera: Conjunctivae normal.  Cardiovascular:     Rate and Rhythm: Normal rate and regular rhythm.     Heart sounds: Normal heart sounds.  Pulmonary:     Effort: Pulmonary effort is normal.     Breath sounds: Normal breath sounds.  Abdominal:     General: Bowel sounds are normal.     Palpations: Abdomen is soft.  Musculoskeletal:     Cervical back: Normal range of motion.  Lymphadenopathy:     Cervical: No cervical adenopathy.  Skin:    General: Skin is warm and dry.     Capillary Refill: Capillary refill takes less than 2 seconds.     Comments: Surgical scar was noted over the left temporal region.  She had mild tenderness over the right temporal region.  Neurological:     Mental Status: She is alert and oriented to person, place, and time.  Psychiatric:        Behavior: Behavior normal.      Musculoskeletal Exam: She had limited range of motion of the cervical spine.  Thoracic kyphosis and scoliosis was noted.  There was no tenderness over thoracic or lumbar spine.  Patient was examined in the seated position.  Shoulder abduction was limited to about 30 degrees bilaterally.  Elbow joints, wrist joints were in good range of motion.  She had bilateral MCP thickening.  She had subluxation of most of her PIP and DIP joints.  Hip joints could not be assessed in the seated position..  Knee joints were in good range of motion without any warmth swelling or effusion.  There was no tenderness over ankles or MTPs.   CDAI Exam: CDAI Score: -- Patient Global: --; Provider Global: -- Swollen: --; Tender: -- Joint Exam 11/24/2023   No joint exam has been documented for this visit   There is currently no  information documented on the homunculus. Go to  the Rheumatology activity and complete the homunculus joint exam.  Investigation: No additional findings.  Imaging: No results found.  Recent Labs: Lab Results  Component Value Date   WBC 6.9 11/11/2023   HGB 11.0 (L) 11/11/2023   PLT 186.0 11/11/2023   NA 135 11/11/2023   K 4.8 11/11/2023   CL 99 11/11/2023   CO2 27 11/11/2023   GLUCOSE 123 (H) 11/11/2023   BUN 26 (H) 11/11/2023   CREATININE 1.13 11/11/2023   BILITOT 0.3 06/22/2023   ALKPHOS 34 (L) 06/22/2023   AST 25 06/22/2023   ALT 13 06/22/2023   PROT 7.5 06/22/2023   ALBUMIN  4.5 06/22/2023   CALCIUM  9.0 11/11/2023   GFRAA 60 05/21/2020   QFTBGOLDPLUS Negative 10/01/2022    Speciality Comments: TX (elevated LFTs) and leflunomide  (hair loss) Plaquenil  (had macular changes)  Orencia  - 7/21 Prolia  - 04/22/19, 11/09/19, 06/06/20,12/20/20  Fu q 3 months  Procedures:  No procedures performed Allergies: Amlodipine , Prochlorperazine edisylate, Aspirin , Cymbalta [duloxetine hcl], and Pamelor  [nortriptyline  hcl]   Assessment / Plan:     Visit Diagnoses: Temporal arteritis (HCC) - biopsy proven 11/18/2023: Marked acute and chronic arteritis consistent with temporal arteritis.  Patient states that the pain and discomfort in the left temporal region resolved after being on prednisone  40 mg p.o. daily.  However she is noticing some tenderness on the right side.  She denies any visual changes.  Detailed counsel regarding temporal arteritis was provided.  I plan to keep her on prednisone  40 mg p.o. daily for now.  My plan is to taper prednisone  by 5 mg every 2 weeks after 1 month as she is still having some symptoms and we would not be able to initiate Actemra.  Patient states she recently finished antibiotics for urinary tract infection.  She has been experiencing diarrhea. I will schedule MRA brain, cervical and thoracic region to look for the extent of arteritis.  There is a strong  association of abdominal aneurysm and GCA.  We will also obtain abdominal ultrasound to look for aneurysm. I discussed use of Bactrim  DS twice daily 3 times a week prophylaxis while she is on prednisone .  We should be able to start Bactrim  DS once her UTI and diarrhea resolved.  Patient has taken Bactrim  in the past without any side effects.  Rheumatoid arthritis of multiple sites with negative rheumatoid factor (HCC) - RF negative, anti-CCP negative: Previous therapy: Methotrexate -elevated LFTs, Arava -hair loss, Plaquenil  discontinued due to macular changes: She had been on Orencia  since July 2021.  However there have been several interruptions due to frequent infections.  We will discontinue Orencia .  The plan is to switch her to Actemra once infection is resolved.  High risk medication use - Orencia  500 mg IV infusions every 6 weeks-.  Currently on hold due to infection.  November 11, 2023 CBC showed hemoglobin of 11.0 made 50,025 CMP was normal.  TB Gold was negative on September 30, 2022.  I will get CMP and TB Gold today.  Elevated sed rate - September 24, 202 sed rate 86, CRP 29.7  History of recurrent UTIs - urine culture on October 05, 2023 was positive for Enterococcus faecalis.  Urine culture from November 13, 2023 was positive for capsula pneumoniae.  She recently finished a course of cephalexin .  She will get repeat urine culture with her PCP.  Diarrhea-patient complains of ongoing diarrhea for the last 1 week.  She is not sure if it is related to the antibiotics.  She has  had C. difficile in the past.  I advised her to schedule an appointment with Dr. Watt in 4 the evaluation of diarrhea.  She would not be able to start on Biologics until the evaluation is complete.  Sicca syndrome-she continues to have dry mouth symptoms.  Raynaud's disease without gangrene-currently not active.  Status post total replacement of right hip - 01/2018 revision by Dr. Vernetta. X-rays of the right hip  were updated on 09/12/2021 by Dr. Vernetta.  Other idiopathic scoliosis, thoracolumbar region-she denies discomfort today.  Age-related osteoporosis without current pathological fracture - DEXA 01/28/2019:BMD Femur Neck is 0.636 g/cm2 with a T-score of -2.9. Patient has declined updating her bone density. prolia  60 mg sq injections every 6 months.  Other medical problems are listed as follows:  Elevated cholesterol  Malignant neoplasm of upper lobe of right lung (HCC) - followed by oncology.  Anemia of chronic disease  Adrenal gland hyperfunction  Essential hypertension-blood pressure was elevated at 150/80.  Repeat blood pressure was 158/74.  She was advised to monitor blood pressure closely and follow-up with her PCP.  Orders: Orders Placed This Encounter  Procedures   Comprehensive metabolic panel with GFR   QuantiFERON-TB Gold Plus   Meds ordered this encounter  Medications   sulfamethoxazole -trimethoprim  (BACTRIM  DS) 800-160 MG tablet    Sig: Take one tablet by mouth Monday, Wednesday, and Friday.    Dispense:  36 tablet    Refill:  0    Face-to-face time spent with patient was 40 minutes. Greater than 50% of time was spent in counseling and coordination of care.  Follow-Up Instructions: Return in about 4 weeks (around 12/22/2023) for Temporal arteritis, Rheumatoid arthritis.   Maya Nash, MD  Note - This record has been created using Animal nutritionist.  Chart creation errors have been sought, but may not always  have been located. Such creation errors do not reflect on  the standard of medical care.

## 2023-11-24 ENCOUNTER — Telehealth: Payer: Self-pay

## 2023-11-24 ENCOUNTER — Other Ambulatory Visit (HOSPITAL_COMMUNITY): Payer: Self-pay

## 2023-11-24 ENCOUNTER — Encounter: Attending: Rheumatology | Admitting: Rheumatology

## 2023-11-24 ENCOUNTER — Encounter: Payer: Self-pay | Admitting: Rheumatology

## 2023-11-24 ENCOUNTER — Other Ambulatory Visit: Payer: Self-pay | Admitting: *Deleted

## 2023-11-24 VITALS — BP 158/74 | HR 75 | Temp 97.2°F | Resp 13 | Ht 60.0 in | Wt 109.8 lb

## 2023-11-24 DIAGNOSIS — Z79899 Other long term (current) drug therapy: Secondary | ICD-10-CM | POA: Diagnosis not present

## 2023-11-24 DIAGNOSIS — Z87440 Personal history of urinary (tract) infections: Secondary | ICD-10-CM | POA: Insufficient documentation

## 2023-11-24 DIAGNOSIS — R197 Diarrhea, unspecified: Secondary | ICD-10-CM | POA: Insufficient documentation

## 2023-11-24 DIAGNOSIS — I10 Essential (primary) hypertension: Secondary | ICD-10-CM | POA: Insufficient documentation

## 2023-11-24 DIAGNOSIS — R300 Dysuria: Secondary | ICD-10-CM | POA: Diagnosis not present

## 2023-11-24 DIAGNOSIS — D638 Anemia in other chronic diseases classified elsewhere: Secondary | ICD-10-CM | POA: Insufficient documentation

## 2023-11-24 DIAGNOSIS — M316 Other giant cell arteritis: Secondary | ICD-10-CM | POA: Diagnosis not present

## 2023-11-24 DIAGNOSIS — M4125 Other idiopathic scoliosis, thoracolumbar region: Secondary | ICD-10-CM | POA: Insufficient documentation

## 2023-11-24 DIAGNOSIS — M0609 Rheumatoid arthritis without rheumatoid factor, multiple sites: Secondary | ICD-10-CM | POA: Insufficient documentation

## 2023-11-24 DIAGNOSIS — E270 Other adrenocortical overactivity: Secondary | ICD-10-CM | POA: Insufficient documentation

## 2023-11-24 DIAGNOSIS — E7800 Pure hypercholesterolemia, unspecified: Secondary | ICD-10-CM | POA: Insufficient documentation

## 2023-11-24 DIAGNOSIS — R700 Elevated erythrocyte sedimentation rate: Secondary | ICD-10-CM | POA: Insufficient documentation

## 2023-11-24 DIAGNOSIS — Z7189 Other specified counseling: Secondary | ICD-10-CM | POA: Insufficient documentation

## 2023-11-24 DIAGNOSIS — M810 Age-related osteoporosis without current pathological fracture: Secondary | ICD-10-CM

## 2023-11-24 DIAGNOSIS — L0292 Furuncle, unspecified: Secondary | ICD-10-CM

## 2023-11-24 DIAGNOSIS — Z96641 Presence of right artificial hip joint: Secondary | ICD-10-CM | POA: Insufficient documentation

## 2023-11-24 DIAGNOSIS — C3411 Malignant neoplasm of upper lobe, right bronchus or lung: Secondary | ICD-10-CM | POA: Insufficient documentation

## 2023-11-24 DIAGNOSIS — R350 Frequency of micturition: Secondary | ICD-10-CM

## 2023-11-24 DIAGNOSIS — M3500 Sicca syndrome, unspecified: Secondary | ICD-10-CM | POA: Diagnosis not present

## 2023-11-24 DIAGNOSIS — I7300 Raynaud's syndrome without gangrene: Secondary | ICD-10-CM | POA: Diagnosis not present

## 2023-11-24 MED ORDER — SULFAMETHOXAZOLE-TRIMETHOPRIM 800-160 MG PO TABS: ORAL_TABLET | ORAL | 0 refills | Status: DC

## 2023-11-24 NOTE — Progress Notes (Signed)
 Prolia  has a copay of $64 when ran as 1 mL for 180 days

## 2023-11-24 NOTE — Addendum Note (Signed)
 Addended by: KNUTE REENA DEL on: 11/24/2023 04:56 PM   Modules accepted: Orders

## 2023-11-24 NOTE — Addendum Note (Signed)
 Addended by: KNUTE REENA DEL on: 11/24/2023 03:11 PM   Modules accepted: Orders

## 2023-11-24 NOTE — Progress Notes (Signed)
 Prolia  has a copay of $64 when ran as 1 mL for 180 days through pharmacy benefits

## 2023-11-24 NOTE — Telephone Encounter (Signed)
 Received notification via Cc'd chart that pt is Actemra new start. Submitted a Prior Authorization request to [employer] for ACTEMRA SQ via CoverMyMeds. Will update once we receive a response.  Key: [id number]

## 2023-11-24 NOTE — Patient Instructions (Addendum)
 Continue prednisone  40 mg daily for 1 month. Please schedule an appointment with Dr. Watt for the evaluation of diarrhea and for a repeat urine culture.  Bactrim  DS 1 tablet p.o. twice daily 3 times a week will be started after urinary tract infection and diarrhea resolve.  We may be able to start Actemra in the future once there is no infection.  Discontinue Orencia  infusions (infusion orders have been discontinued)  Tocilizumab Injection What is this medication? TOCILIZUMAB (TOE si LIZ ue mab) treats autoimmune conditions, such as arthritis. It works by slowing down an overactive immune system. It may also be used to treat severe COVID-19 in people who are hospitalized. It is a monoclonal antibody. This medicine may be used for other purposes; ask your health care provider or pharmacist if you have questions. COMMON BRAND NAME(S): Actemra, TOFIDENCE, Tyenne What should I tell my care team before I take this medication? They need to know if you have any of these conditions: Cancer Diabetes Heart disease History of or current hepatitis B infection High blood pressure High cholesterol Immune system problems Infection Liver disease Low blood counts, such as low white cell, platelet, or red cell counts Multiple sclerosis Recent or upcoming vaccine Stomach or intestine problems Stroke An unusual or allergic reaction to tocilizumab, other medications, foods, dyes, or preservatives Pregnant or trying to get pregnant Breast-feeding How should I use this medication? This medication is injected into a vein or under the skin. It may be given by your care team in a hospital or clinic setting. It may also be given at home. If you get this medication at home, you will be taught how to prepare and give it. Use it exactly as directed. Take it as directed on the prescription label. Keep taking it unless your care team tells you to stop. If you use a pen, be sure to take off the outer needle  cover before using the dose. It is important that you put your used needles and syringes in a special sharps container. Do not put them in a trash can. If you do not have a sharps container, call your pharmacist or care team to get one. A special MedGuide will be given to you by the pharmacist with each prescription and refill. Be sure to read this information carefully each time. Talk to your care team about the use of this medication in children. While it may be prescribed for children as young as 2 years for selected conditions, precautions do apply. Overdosage: If you think you have taken too much of this medicine contact a poison control center or emergency room at once. NOTE: This medicine is only for you. Do not share this medicine with others. What if I miss a dose? If you get this medication at the hospital or clinic: It is important not to miss your dose. Call your care team if you are unable to keep an appointment. If you give yourself this medication at home: If you miss a dose, take it as soon as you can. If it is almost time for your next dose, take only that dose. Do not take double or extra doses. Call your care team with questions. What may interact with this medication? Do not take this medication with any of the following: Live virus vaccines This medication may also interact with the following: Biologic medications, such as abatacept , adalimumab, anakinra, certolizumab, etanercept, golimumab, infliximab, rituximab, secukinumab, ustekinumab Certain medications for cholesterol, such as atorvastatin, lovastatin, simvastatin Cyclosporine Estrogen or  progestin hormones Omeprazole  Steroid medications, such as prednisone  or cortisone Theophylline Vaccines Warfarin This list may not describe all possible interactions. Give your health care provider a list of all the medicines, herbs, non-prescription drugs, or dietary supplements you use. Also tell them if you smoke, drink alcohol ,  or use illegal drugs. Some items may interact with your medicine. What should I watch for while using this medication? Visit your care team for regular checks on your progress. Your condition will be monitored carefully while you are receiving this medication. Tell your care team if your symptoms do not start to get better or if they get worse. You may need blood work done while taking this medication. You will be tested for tuberculosis (TB) before you start this medication. If your care team prescribes any medication for TB, you should start taking the TB medication before starting this medication. Make sure to finish the full course of TB medication. This medication may increase your risk of getting an infection. Call your care team for advice if you get a fever, chills, sore throat, or other symptoms of a cold or flu. Do not treat yourself. Try to avoid being around people who are sick. Talk to your care team about your risk of cancer. You may be more at risk for certain types of cancers if you take this medication. What side effects may I notice from receiving this medication? Side effects that you should report to your care team as soon as possible: Allergic reactions--skin rash, itching, hives, swelling of the face, lips, tongue, or throat Infection--fever, chills, cough, sore throat, wounds that don't heal, pain or trouble when passing urine, general feeling of discomfort or being unwell Liver injury--right upper belly pain, loss of appetite, nausea, light-colored stool, dark yellow or brown urine, yellowing skin or eyes, unusual weakness or fatigue Rash, fever, and swollen lymph nodes Stomach pain that is severe, does not go away, or gets worse Unusual bruising or bleeding Side effects that usually do not require medical attention (report these to your care team if they continue or are bothersome): Dizziness Headache Pain, redness, or irritation at injection site Runny or stuffy nose Sore  throat Stomach pain This list may not describe all possible side effects. Call your doctor for medical advice about side effects. You may report side effects to FDA at 1-800-FDA-1088. Where should I keep my medication? Keep out of the reach of children and pets. You will be instructed on how to store this medication. Get rid of any unused medication after the expiration date on the label. To get rid of medications that are no longer needed or have expired: Take the medication to a medication take-back program. Check with your pharmacy or law enforcement to find a location. If you cannot return the medication, ask your pharmacist or care team how to get rid of this medication safely. NOTE: This sheet is a summary. It may not cover all possible information. If you have questions about this medicine, talk to your doctor, pharmacist, or health care provider.  2025 Elsevier/Gold Standard (2023-01-22 00:00:00)

## 2023-11-24 NOTE — Addendum Note (Signed)
 Addended by: KNUTE REENA DEL on: 11/24/2023 02:37 PM   Modules accepted: Orders

## 2023-11-24 NOTE — Progress Notes (Signed)
 Pharmacy Note Subjective: Patient presents today to the [facility] for follow up office visit. Patient seen by the pharmacist for counseling on Actemra for rheumatoid arthritis and recently diagnosed temporal arteritis. She is currently taking prednisone  40mg  daily. Her last Orencia  infusion was on 06/03/23  History of hyperlipidemia: Yes  History of diverticulitis: No; history of colitis and recurrent diarrhea  Objective: CBC    Component Value Date/Time   WBC 6.9 11/11/2023 1533   RBC 3.68 (L) 11/11/2023 1533   HGB 11.0 (L) 11/11/2023 1533   HGB 10.8 (L) 11/20/2022 1133   HGB 11.0 (L) 08/21/2016 1125   HCT 33.3 (L) 11/11/2023 1533   HCT 33.6 (L) 08/21/2016 1125   PLT 186.0 11/11/2023 1533   PLT 198 11/20/2022 1133   PLT 160 08/21/2016 1125   PLT 150 09/22/2008 0000   MCV 90.3 11/11/2023 1533   MCV 88.9 08/21/2016 1125   MCH 29.9 06/03/2023 1058   MCHC 33.2 11/11/2023 1533   RDW 13.7 11/11/2023 1533   RDW 13.0 08/21/2016 1125   LYMPHSABS 1.4 06/03/2023 1058   LYMPHSABS 1.0 08/21/2016 1125   MONOABS 0.6 06/03/2023 1058   MONOABS 0.5 08/21/2016 1125   EOSABS 0.1 06/03/2023 1058   EOSABS 0.0 08/21/2016 1125   BASOSABS 0.0 06/03/2023 1058   BASOSABS 0.0 08/21/2016 1125    CMP     Component Value Date/Time   NA 135 11/11/2023 1533   NA 130 (L) 04/26/2021 0000   NA 130 (L) 08/21/2016 1125   K 4.8 11/11/2023 1533   K 4.8 08/21/2016 1125   CL 99 11/11/2023 1533   CO2 27 11/11/2023 1533   CO2 26 08/21/2016 1125   GLUCOSE 123 (H) 11/11/2023 1533   GLUCOSE 82 08/21/2016 1125   GLUCOSE 85 09/22/2008 0000   BUN 26 (H) 11/11/2023 1533   BUN 27 04/26/2021 0000   BUN 25.6 08/21/2016 1125   CREATININE 1.13 11/11/2023 1533   CREATININE 1.16 (H) 01/27/2023 1409   CREATININE 1.2 (H) 08/21/2016 1125   CALCIUM  9.0 11/11/2023 1533   CALCIUM  10.0 08/21/2016 1125   PROT 7.5 06/22/2023 1630   PROT 7.4 08/21/2016 1125   ALBUMIN  4.5 06/22/2023 1630   ALBUMIN  4.0  08/21/2016 1125   AST 25 06/22/2023 1630   AST 23 11/20/2022 1133   AST 27 08/21/2016 1125   ALT 13 06/22/2023 1630   ALT 13 11/20/2022 1133   ALT 13 08/21/2016 1125   ALKPHOS 34 (L) 06/22/2023 1630   ALKPHOS 54 08/21/2016 1125   BILITOT 0.3 06/22/2023 1630   BILITOT 0.3 11/20/2022 1133   BILITOT 0.31 08/21/2016 1125   GFRNONAA 48 (L) 06/03/2023 1058   GFRNONAA 48 (L) 11/20/2022 1133   GFRNONAA 52 (L) 05/21/2020 1341   GFRAA 60 05/21/2020 1341     Baseline Immunosuppressant Therapy Labs     Latest Ref Rng & Units 10/01/2022   11:07 AM  Quantiferon TB Gold  Quantiferon TB Gold Plus Negative Negative        Latest Ref Rng & Units 10/21/2018   11:09 AM  Hepatitis  Hep B Surface Ag NON-REACTI NON-REACTIVE   Hep B IgM NON-REACTI NON-REACTIVE     Lab Results  Component Value Date   HIV NON-REACTIVE 10/21/2018       Latest Ref Rng & Units 10/21/2018   11:09 AM  Immunoglobulin Electrophoresis  IgA  70 - 320 mg/dL 59   IgG 399 - 8,459 mg/dL 8,646   IgM 50 - 699  mg/dL 87        Latest Ref Rng & Units 06/22/2023    4:30 PM  Serum Protein Electrophoresis  Total Protein 6.0 - 8.3 g/dL 7.5     No results found for: G6PDH  No results found for: TPMT   Lipid Panel Lab Results  Component Value Date   CHOL 207 (H) 04/29/2023   HDL 55 04/29/2023   LDLCALC 118 (H) 04/29/2023   LDLDIRECT 100.9 08/13/2011   TRIG 168 (H) 04/29/2023   CHOLHDL 3.8 04/29/2023    Chest x-ray: 04/12/23 - Patchy bilateral opacities with bilateral pulmonary nodules, similar to prior CT.  Assessment/Plan:  Counseled patient that Actemra is an IL-6 blocking agent.  Counseled patient on purpose, proper use, and adverse effects of Actemra.  Reviewed the most common adverse effects including infections, infusion reactions, bowel injury, and rarely cancer and conditions of the nervous system.  Reviewed risk of lipid abnormalities and elevated liver enzymes. Discussed that there is the possibility of  an increased risk of malignancy but it is not well understood if this increased risk is due to the medication or the disease state. Counseled patient that Actemra should be held prior to scheduled surgery for infections or new antibiotic use. Counseled patient that Actemra should be held prior to scheduled surgery. Recommend annual influenza, PCV 15 or PCV20 or Pneumovax 23 , and Shingrix as indicated.  Reviewed the importance of regular labs while on Actemra therapy including the need for routine lipid panel.  Will recheck lipid panel after 4-8 weeks of starting Actemra, then every 6 months routinely. CBC and CMP will be monitored every 3 months. TB gold will be monitored annually. Standing orders placed.  Provided patient with medication education material and answered all questions.  Patient voiced understanding.  Patient consented to Actemra.  Will upload consent into the media tab.  Reviewed storage instructions of Actemra with patient.  Advised patient that initial Actemra injection must be given in the office.  Will apply for Actemra through patient's insurance for self-administration using auto-injector/prefilled syringe.  Patient dose will be Actemra 162 mg subcut every 14 days (due to history of recurrent infections while on immunosuppressive treatment) .   Prescription pending lab results, insurance approval, and confirmation that infection has resolved (pt to message PCP) She will continue prednisone  40mg  daily x 1 month, then reduce to 35mg  daily.  She is due for Prolia  - pt prefers to receive in office if more affordable than at infusion center  Once diarrhea resolves, she will start Bactrim  DS Mondays, Wednesdays, and Fridays for PJP ppx while on high-dose steroids.  She reports tolerating Bactrim  relatively better from GI perspective compared. Rx sent to [facility] for patient to have on hand when ready to start. She is aware that PJP ppx is indicated after 4 weeks of high-dose steroid  exposure.  [name], PharmD, MPH, BCPS, CPP Clinical Pharmacist [facility])

## 2023-11-25 ENCOUNTER — Telehealth: Payer: Self-pay

## 2023-11-25 ENCOUNTER — Telehealth: Payer: Self-pay | Admitting: Rheumatology

## 2023-11-25 ENCOUNTER — Other Ambulatory Visit (HOSPITAL_COMMUNITY): Payer: Self-pay

## 2023-11-25 ENCOUNTER — Ambulatory Visit: Payer: Self-pay | Admitting: Rheumatology

## 2023-11-25 ENCOUNTER — Other Ambulatory Visit

## 2023-11-25 DIAGNOSIS — N390 Urinary tract infection, site not specified: Secondary | ICD-10-CM | POA: Diagnosis not present

## 2023-11-25 NOTE — Telephone Encounter (Signed)
 Received notification from [facility] regarding a prior authorization for ACTEMRA SQ. Authorization has been APPROVED from 02/18/23 to 02/16/25. Approval letter sent to scan center.  Per test claim, copay for 28 days supply is $100  Authorization # [id number] Phone # [phone]

## 2023-11-25 NOTE — Telephone Encounter (Signed)
 Called pt and discussed, she is agreeable with $100 copay and wishes to move forward with treatment. She informs me that she is scheduled for a f/u with Waddell Craze on 11/6 and would be interested in possibly scheduling new start appointment on that day as well. Advised that I would forward this information over to Devki and that she would be in touch to confirm new start appointment.

## 2023-11-25 NOTE — Telephone Encounter (Signed)
 Copied from CRM [id number]. Topic: Appointments - Scheduling Inquiry for Clinic >> Nov 25, 2023  2:14 PM Harlene ORN wrote: Reason for CRM: daughter is calling to pick up urine cups to give for her mother's urine sample. Please have cup ready for her when she comes in.

## 2023-11-25 NOTE — Progress Notes (Signed)
 Creatinine is mildly elevated and GFR is 50.  Sodium is low and potassium is elevated.  Calcium  is elevated.  Patient should avoid calcium  supplement.  Please forward results to her PCP.

## 2023-11-25 NOTE — Telephone Encounter (Signed)
 Pts daughter has been provided with urine cups.

## 2023-11-25 NOTE — Telephone Encounter (Signed)
Patient advised in lab result note.

## 2023-11-25 NOTE — Telephone Encounter (Signed)
Patient calling back for labs

## 2023-11-26 ENCOUNTER — Encounter: Payer: Self-pay | Admitting: Internal Medicine

## 2023-11-26 ENCOUNTER — Telehealth: Payer: Self-pay | Admitting: Medical Oncology

## 2023-11-26 ENCOUNTER — Telehealth: Payer: Self-pay

## 2023-11-26 LAB — COMPREHENSIVE METABOLIC PANEL WITH GFR
AG Ratio: 1.5 (calc) (ref 1.0–2.5)
ALT: 10 U/L (ref 6–29)
AST: 19 U/L (ref 10–35)
Albumin: 4.2 g/dL (ref 3.6–5.1)
Alkaline phosphatase (APISO): 45 U/L (ref 37–153)
BUN/Creatinine Ratio: 32 (calc) — ABNORMAL HIGH (ref 6–22)
BUN: 34 mg/dL — ABNORMAL HIGH (ref 7–25)
CO2: 33 mmol/L — ABNORMAL HIGH (ref 20–32)
Calcium: 10.7 mg/dL — ABNORMAL HIGH (ref 8.6–10.4)
Chloride: 91 mmol/L — ABNORMAL LOW (ref 98–110)
Creat: 1.05 mg/dL — ABNORMAL HIGH (ref 0.60–0.95)
Globulin: 2.8 g/dL (ref 1.9–3.7)
Glucose, Bld: 93 mg/dL (ref 65–99)
Potassium: 5.4 mmol/L — ABNORMAL HIGH (ref 3.5–5.3)
Sodium: 132 mmol/L — ABNORMAL LOW (ref 135–146)
Total Bilirubin: 0.3 mg/dL (ref 0.2–1.2)
Total Protein: 7 g/dL (ref 6.1–8.1)
eGFR: 50 mL/min/1.73m2 — ABNORMAL LOW (ref >=60)

## 2023-11-26 LAB — QUANTIFERON-TB GOLD PLUS
Mitogen-NIL: 7.81 [IU]/mL
NIL: 0.01 [IU]/mL
QuantiFERON-TB Gold Plus: NEGATIVE
TB1-NIL: 0 [IU]/mL
TB2-NIL: 0 [IU]/mL

## 2023-11-26 LAB — URINE CULTURE
MICRO NUMBER:: 17073270
Result:: NO GROWTH
SPECIMEN QUALITY:: ADEQUATE

## 2023-11-26 NOTE — Telephone Encounter (Signed)
 FYI,  Dianne, from Dr. Jeannett office, contacted the office to state the patient inquired if her CT Aorta, neck, and Abdomen could be added onto her Chest CT w/ contrast scheduled on 12/25/2023. Lawrnce states she advised the patient that she believed they could not all be done together and to keep her Chest CT scheduled and then the other CTs would be scheduled later. If needed, Dianne's call back number is [phone].

## 2023-11-26 NOTE — Telephone Encounter (Signed)
 Pt asking if Dr Jammie  order for - CT angio chest aorta , CT angio head /neck and CT angio abdomen can be done on 11/07 with CT chest ordered by Dr. Sherrod.  I LVM at Dr Jammie regarding pt question. Per Radiology central scheduling they need to be two separate appointments .  I called pt and told her that per Dr Sherrod she needs the CT chest w  a separate appointments and I will call radiology to be sure . There was no radiologist for me to ask. Will call back Monday.

## 2023-11-27 ENCOUNTER — Encounter: Payer: Self-pay | Admitting: Family Medicine

## 2023-11-27 ENCOUNTER — Ambulatory Visit: Payer: Self-pay | Admitting: Family Medicine

## 2023-11-27 NOTE — Progress Notes (Signed)
 Thank you :)

## 2023-11-27 NOTE — Progress Notes (Signed)
TB Gold negative

## 2023-12-01 NOTE — Progress Notes (Signed)
 Patient is scheduled for Prolia  on 12/25/23

## 2023-12-01 NOTE — Telephone Encounter (Signed)
 Patient's urine drug screen negative for UTI. Ok to proceed with Actemra. Patient is scheduled for Actemra new start on 12/08/23. Will use sample.  She has been advised to start Bactrim  DS Monday, Wednesday, Friday for PJP ppx  [name], PharmD, MPH, BCPS, CPP Clinical Pharmacist [facility])

## 2023-12-03 NOTE — Progress Notes (Signed)
 Pharmacy Note  Subjective:   Patient presents to clinic today to receive first dose of ACTEMRA SQ for giant cell arteritis and rheumatoid arthritis. She is accompanied by her daughter [name] today. Previously on Orencia  infusions (last infusion was on 06/04/2023). She has history of CHF and lung cancer (ongoing serial chest xrays for monitoring and will not be treated if concerns for recurrence)  Patient running a fever or have signs/symptoms of infection? No  Patient currently on antibiotics for the treatment of infection? No  Patient have any upcoming invasive procedures/surgeries? No  Objective: CMP     Component Value Date/Time   NA 132 (L) 11/24/2023 0933   NA 130 (L) 04/26/2021 0000   NA 130 (L) 08/21/2016 1125   K 5.4 (H) 11/24/2023 0933   K 4.8 08/21/2016 1125   CL 91 (L) 11/24/2023 0933   CO2 33 (H) 11/24/2023 0933   CO2 26 08/21/2016 1125   GLUCOSE 93 11/24/2023 0933   GLUCOSE 82 08/21/2016 1125   GLUCOSE 85 09/22/2008 0000   BUN 34 (H) 11/24/2023 0933   BUN 27 04/26/2021 0000   BUN 25.6 08/21/2016 1125   CREATININE 1.05 (H) 11/24/2023 0933   CREATININE 1.2 (H) 08/21/2016 1125   CALCIUM  10.7 (H) 11/24/2023 0933   CALCIUM  10.0 08/21/2016 1125   PROT 7.0 11/24/2023 0933   PROT 7.4 08/21/2016 1125   ALBUMIN  4.5 06/22/2023 1630   ALBUMIN  4.0 08/21/2016 1125   AST 19 11/24/2023 0933   AST 23 11/20/2022 1133   AST 27 08/21/2016 1125   ALT 10 11/24/2023 0933   ALT 13 11/20/2022 1133   ALT 13 08/21/2016 1125   ALKPHOS 34 (L) 06/22/2023 1630   ALKPHOS 54 08/21/2016 1125   BILITOT 0.3 11/24/2023 0933   BILITOT 0.3 11/20/2022 1133   BILITOT 0.31 08/21/2016 1125   GFRNONAA 48 (L) 06/03/2023 1058   GFRNONAA 48 (L) 11/20/2022 1133   GFRNONAA 52 (L) 05/21/2020 1341   GFRAA 60 05/21/2020 1341    CBC    Component Value Date/Time   WBC 6.9 11/11/2023 1533   RBC 3.68 (L) 11/11/2023 1533   HGB 11.0 (L) 11/11/2023 1533   HGB 10.8 (L) 11/20/2022 1133   HGB 11.0 (L)  08/21/2016 1125   HCT 33.3 (L) 11/11/2023 1533   HCT 33.6 (L) 08/21/2016 1125   PLT 186.0 11/11/2023 1533   PLT 198 11/20/2022 1133   PLT 160 08/21/2016 1125   PLT 150 09/22/2008 0000   MCV 90.3 11/11/2023 1533   MCV 88.9 08/21/2016 1125   MCH 29.9 06/03/2023 1058   MCHC 33.2 11/11/2023 1533   RDW 13.7 11/11/2023 1533   RDW 13.0 08/21/2016 1125   LYMPHSABS 1.4 06/03/2023 1058   LYMPHSABS 1.0 08/21/2016 1125   MONOABS 0.6 06/03/2023 1058   MONOABS 0.5 08/21/2016 1125   EOSABS 0.1 06/03/2023 1058   EOSABS 0.0 08/21/2016 1125   BASOSABS 0.0 06/03/2023 1058   BASOSABS 0.0 08/21/2016 1125    Baseline Immunosuppressant Therapy Labs TB GOLD    Latest Ref Rng & Units 11/24/2023    9:33 AM  Quantiferon TB Gold  Quantiferon TB Gold Plus NEGATIVE NEGATIVE    Hepatitis Panel    Latest Ref Rng & Units 10/21/2018   11:09 AM  Hepatitis  Hep B Surface Ag NON-REACTI NON-REACTIVE   Hep B IgM NON-REACTI NON-REACTIVE   Hepatitis C antibody - non reactive on 07/16/2018  HIV Lab Results  Component Value Date   HIV NON-REACTIVE 10/21/2018  Immunoglobulins    Latest Ref Rng & Units 10/21/2018   11:09 AM  Immunoglobulin Electrophoresis  IgA  70 - 320 mg/dL 59   IgG 399 - 8,459 mg/dL 8,646   IgM 50 - 699 mg/dL 87    SPEP    Latest Ref Rng & Units 11/24/2023    9:33 AM  Serum Protein Electrophoresis  Total Protein 6.1 - 8.1 g/dL 7.0    Chest x-ray: 7/76/7974 - Patchy bilateral opacities with bilateral pulmonary nodules, similar to prior CT  Assessment/Plan:  Reviewed importance of holding ACTEMRA SQ with signs/symptoms of an infections, if antibiotics are prescribed to treat an active infection, and with invasive procedures  Demonstrated proper injection technique with Actemra ActPen demo device  Patient able to demonstrate proper injection technique using the teach back method.  Provider injected in the right lower abdomen with:  Sample Medication: Actemra ActPen 162mg /0.45mL NDC:  [address]-13 Lot: 05/2025 Expiration: [id number]  Patient tolerated well.  Observed for 30 mins in office for adverse reaction. Patient denies itchiness and irritation at injection., No swelling or redness noted., and Reviewed injection site reaction management with patient verbally and printed information for review in AVS. Of note, patient's daughter [name] does not feel comfortable with administration of injections. Her hsuband is an ophthalmologist and can assist. Patient feels somewhat confident with self-administration but will likely have support from her son-in-law.  Patient is to return in 1 month for labs and 6-8 weeks for follow-up appointment.  Standing orders for CBC/CMP and lipid panel placed.  TB gold will be monitored yearly. Lipid panel will be monitored in 4-6 weeks then every 6 months.  ACTEMRA SQ approved through insurance .   Rx sent to: [facility]: [phone] .  Patient provided with pharmacy phone number and advised to call later this week to schedule shipment to home.  Patient will continue ACTEMRA SQ 162mg  subcut every 14 days in combination with steroid taper.  Continue Bactrim  DS three times weekly . Reduce prednisone  to 35mg  once daily x 2 weeks.  All questions encouraged and answered.  Instructed patient to call with any further questions or concerns.  [name], PharmD, MPH, BCPS, CPP Clinical Pharmacist [facility])  12/03/2023 3:32 PM

## 2023-12-08 ENCOUNTER — Encounter (INDEPENDENT_AMBULATORY_CARE_PROVIDER_SITE_OTHER): Admitting: Pharmacist

## 2023-12-08 DIAGNOSIS — Z87440 Personal history of urinary (tract) infections: Secondary | ICD-10-CM | POA: Diagnosis not present

## 2023-12-08 DIAGNOSIS — M0609 Rheumatoid arthritis without rheumatoid factor, multiple sites: Secondary | ICD-10-CM | POA: Diagnosis not present

## 2023-12-08 DIAGNOSIS — Z79899 Other long term (current) drug therapy: Secondary | ICD-10-CM | POA: Diagnosis not present

## 2023-12-08 DIAGNOSIS — M316 Other giant cell arteritis: Secondary | ICD-10-CM | POA: Diagnosis not present

## 2023-12-08 DIAGNOSIS — Z7189 Other specified counseling: Secondary | ICD-10-CM

## 2023-12-08 MED ORDER — PREDNISONE 10 MG PO TABS
ORAL_TABLET | ORAL | 0 refills | Status: AC
Start: 2023-12-08 — End: ?

## 2023-12-08 MED ORDER — ACTEMRA ACTPEN 162 MG/0.9ML ~~LOC~~ SOAJ
162.0000 mg | SUBCUTANEOUS | 0 refills | Status: DC
  Filled 2023-12-16: qty 1.8, 28d supply, fill #0
  Filled 2024-01-07: qty 1.8, 28d supply, fill #1
  Filled 2024-02-01: qty 1.8, 28d supply, fill #2

## 2023-12-08 NOTE — Patient Instructions (Signed)
 Your next ACTEMRA SQ dose is due on 12/22/23, 01/05/24, and every 14 days thereafter  REDUCE PREDNISONE  to 35 mg once daily CONTINUE Bactrim  DS Mondays, Wednesdays, and Fridays since you are on a high dose of prednisone   HOLD ACTEMRA SQ if you have signs or symptoms of an infection. You can resume once you feel better or back to your baseline. HOLD ACTEMRA SQ if you start antibiotics to treat an infection. HOLD ACTEMRA SQ around the time of surgery/procedures. Your surgeon will be able to provide recommendations on when to hold BEFORE and when you are cleared to RESUME.  Pharmacy information: Your prescription will be shipped from [facility]. Their phone number is [phone] [name] will call to schedule shipment and confirm address. They will mail your medication to your home.  Labs are due in 1 month then every 3 months. Lab hours are from Monday to Thursday 8am-12:30pm and 1pm-4pm and Friday 8am-12pm. You do not need an appointment if you come for labs during these times. If you'd like to go to a [facility] or [facility] closer to home, please call our clinic 48 hours prior to lab date so we can release orders in a timely manner.  Stay up to date on all routine vaccines: influenza, pneumonia, COVID19, Shingles  How to manage an injection site reaction: Remember the 5 C's: COUNTER - leave on the counter at least 30 minutes but up to overnight to bring medication to room temperature. This may help prevent stinging COLD - place something cold (like an ice gel pack or cold water  bottle) on the injection site just before cleansing with alcohol . This may help reduce pain CLARITIN - use Claritin (generic name is loratadine) for the first two weeks of treatment or the day of, the day before, and the day after injecting. This will help to minimize injection site reactions CORTISONE CREAM - apply if injection site is irritated and itching CALL ME - if injection site reaction is bigger  than the size of your fist, looks infected, blisters, or if you develop hives

## 2023-12-09 ENCOUNTER — Other Ambulatory Visit: Payer: Self-pay | Admitting: Family Medicine

## 2023-12-09 DIAGNOSIS — M25512 Pain in left shoulder: Secondary | ICD-10-CM

## 2023-12-09 DIAGNOSIS — G8929 Other chronic pain: Secondary | ICD-10-CM

## 2023-12-14 ENCOUNTER — Telehealth: Payer: Self-pay | Admitting: Pharmacist

## 2023-12-14 NOTE — Telephone Encounter (Signed)
 Patient called about a possible side effect to prednisone .She states that her fingers bilaterally look infected. Worse on left. The area around her nails look red. She denies blistering. She states it is not related to her Actemra but related to her prednisone .   I have advised her to have daughter, [name], take picture of hands and send to our office via MyChart  It is unlikely that it is related to prednisone . Could be diffuse bruising in hands from the high-dose prednisone  use?  [name], PharmD, MPH, BCPS, CPP Clinical Pharmacist [facility])

## 2023-12-14 NOTE — Progress Notes (Signed)
 Office Visit Note  Patient: [name]             Date of Birth: 1930/04/28           MRN: [id number]             PCP: Watt Harlene BROCKS, MD Referring: Watt Harlene BROCKS, MD Visit Date: 12/24/2023 Occupation: Data Unavailable  Subjective:  Discuss prednisone  dose   History of Present Illness: [name] is a 89 y.o. female with history of rheumatoid arthritis and giant cell arteritis.  Patient was started on subcutaneous Actemra on 12/08/2023.  She has tolerated Actemra without any side effects.  Patient states that she has been following the prednisone  taper as advised.  She reduced the dose of prednisone  from 35 mg daily to 30 mg daily starting today.  Patient has had difficulty sleeping due to the high dose of prednisone .  She said difficulty falling asleep as well as staying asleep and would like to discuss the prednisone  taper.  Patient states that she has been taking omeprazole  on a daily basis while on prednisone .  Patient states that Pepcid  was not effective enough at managing her GI symptoms.  Patient continues to have joint pain in her shoulders, hands, and knees but denies any increased joint stiffness.  She denies any joint swelling at this time. She denies any recurrence of a temporal headache.  She denies any jaw pain.  She denies any vision changes.  She denies any increased difficulty raising her arms above her head or rising from a seated position.  She continues to use a rollator walker to assist with ambulation. Patient states that she had a Prolia  injection administered today.  Activities of Daily Living:  Patient reports morning stiffness for 0 minute.   Patient Reports nocturnal pain.  Difficulty dressing/grooming: Reports Difficulty climbing stairs: Reports Difficulty getting out of chair: Reports Difficulty using hands for taps, buttons, cutlery, and/or writing: Reports  Review of Systems  Constitutional:  Negative for fatigue.  HENT:  Negative for  mouth sores and mouth dryness.   Eyes:  Negative for dryness.  Respiratory:  Negative for shortness of breath.   Cardiovascular:  Negative for chest pain and palpitations.  Gastrointestinal:  Negative for blood in stool, constipation and diarrhea.  Endocrine: Negative for increased urination.  Genitourinary:  Positive for involuntary urination.  Musculoskeletal:  Positive for joint pain, gait problem, joint pain, joint swelling, myalgias and myalgias. Negative for muscle weakness, morning stiffness and muscle tenderness.  Skin:  Positive for rash and hair loss. Negative for color change and sensitivity to sunlight.  Allergic/Immunologic: Negative for susceptible to infections.  Neurological:  Negative for dizziness and headaches.  Hematological:  Negative for swollen glands.  Psychiatric/Behavioral:  Positive for sleep disturbance. Negative for depressed mood. The patient is not nervous/anxious.     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   HTN (hypertension) 04/10/2018   Femoral  artery pseudo-aneurysm, right 04/09/2018   Anemia of chronic disease 02/24/2018   Unstable right hip arthroplasty 01/24/2018   History of revision of total replacement of right hip joint 01/24/2018   Hypovolemic shock (HCC)    Hyperkalemia    Hyponatremia    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   Status post revision of total hip 11/27/2017    Elevated cholesterol 10/11/2015   Adrenal gland hyperfunction 10/04/2014   Bilateral leg edema 08/01/2014   Elevated BP 08/01/2014   Rheumatoid arthritis involving multiple joints (HCC) 05/30/2014   Status post total replacement of right hip 04/28/2014   Long-term use of high-risk medication 11/11/2013   Symptomatic anemia 11/11/2013   Neuropathic pain 07/07/2013   Constipation due to pain medication 07/07/2013   Protein-calorie malnutrition, severe (HCC) 06/08/2013   Lung cancer, Right upper lobe 05/08/2013   Sciatica of right side 08/13/2011   Osteoporosis 03/07/2010   PARESTHESIA 03/07/2010   Low back pain 03/16/2009   CT, CHEST, ABNORMAL 12/18/2008   ABNORMAL ECHOCARDIOGRAM 12/14/2008   SJOGREN'S SYNDROME 11/29/2008   HYPOGLYCEMIA 06/29/2006   RAYNAUD'S DISEASE 06/29/2006    Past Medical History:  Diagnosis Date   Arthralgia of multiple joints    followed by dr thamas   Arthritis    C. difficile colitis 08/2022   Cardiomyopathy (HCC)    CHF (congestive heart failure) (HCC)    Chronic constipation    Chronic inflammatory arthritis    rhemotolgist-  dr a. ziolkowska ([facility])   Dry eyes    eye drops used    GERD (gastroesophageal reflux disease)    H/O discoid lupus erythematosus    Hiatal hernia    History of colon polyps    Hypothyroidism    Iron deficiency anemia    LBBB (left bundle branch block) 2010   Mitchell's disease (erythromelalgia)    neurologist-  dr patel   Nocturia    Non-small cell cancer of right lung [facility]) surgeon-- dr gerhardt/  oncologist-  dr sherrod--- per lov notes no recurrence/   11-18-2017 per pt denies any symptoms   dx 2015--  Stage IIA (T2b,N0,M0) , +EGFR  mutation in exon 21, non-small cell adenocarcinoma right upper lobe---  s/p  Right upper lobectomy , right middley wedge resection and node dissection---  no chemo or radiation therapy   OA (osteoarthritis)    hands   Osteoporosis    PONV (postoperative nausea and vomiting)     likes phenergan    Raynaud's phenomenon 1965   Renal insufficiency    Rheumatoid arthritis (HCC)    Sciatica    Scoliosis    Sjogren's syndrome     Family History  Problem Relation Age of Onset   Other Mother 5       MVA   Coronary artery disease Father    Colon cancer Father    Diabetes Father    Cancer Father        colon   Healthy Sister    Pneumonia Sister    Healthy Brother    Other Brother        killed in war   Hypothyroidism Daughter    Healthy Daughter    Esophageal cancer Neg Hx    Kidney disease Neg Hx    Liver disease Neg Hx    Past Surgical History:  Procedure Laterality Date   ANTERIOR HIP REVISION Right 11/27/2017   Procedure: RIGHT HIP ACETABULAR REVISION;  Surgeon: Vernetta Lonni GRADE, MD;  Location: [facility];  Service:  Orthopedics;  Laterality: Right;   ANTERIOR HIP REVISION Right 01/24/2018   Procedure: OPEN REDUCTION OF DISLOCATED ANTERIOR HIP WITH REVISION OF LINER AND HIP BALL;  Surgeon: Vernetta Lonni GRADE, MD;  Location: [facility];  Service: Orthopedics;  Laterality: Right;   APPENDECTOMY  1950s   ARTERY BIOPSY Left 11/18/2023   Procedure: BIOPSY TEMPORAL ARTERY;  Surgeon: Gretta Lonni PARAS, MD;  Location: [facility] OR;  Service: Vascular;  Laterality: Left;   BIOPSY  04/14/2018   Procedure: BIOPSY;  Surgeon: Leigh Elspeth SQUIBB, MD;  Location: [facility];  Service: Gastroenterology;;   BIOPSY  04/16/2018   Procedure: BIOPSY;  Surgeon: Wilhelmenia Aloha Raddle., MD;  Location: [facility] ENDOSCOPY;  Service: Gastroenterology;;   CARDIOVASCULAR STRESS TEST  12/2008    mild fixed basal to mid septal perfusion defect felt likely due to artifact from LBBB, no ischemia, EF 58%   COLONOSCOPY     COLONOSCOPY WITH PROPOFOL  N/A 04/16/2018   Procedure: COLONOSCOPY WITH PROPOFOL ;  Surgeon: Wilhelmenia Aloha Raddle., MD;  Location: [facility] ENDOSCOPY;  Service: Gastroenterology;  Laterality: N/A;   ESOPHAGOGASTRODUODENOSCOPY (EGD) WITH PROPOFOL  N/A 04/14/2018   Procedure:  ESOPHAGOGASTRODUODENOSCOPY (EGD) WITH PROPOFOL ;  Surgeon: Leigh Elspeth SQUIBB, MD;  Location: [facility] ENDOSCOPY;  Service: Gastroenterology;  Laterality: N/A;   FEMORAL-POPLITEAL BYPASS GRAFT Right 04/10/2018   Procedure: REPAIR RIGHT FEMORAL ARTERY PSEUDOANEURYSM, RETROPERITONEAL EXPOSURE OF ILIAC ARTERY, RIGHT POPLITEAL EMBOLECTOMY;  Surgeon: Eliza Lonni RAMAN, MD;  Location: [facility] OR;  Service: Vascular;  Laterality: Right;   FLEXIBLE SIGMOIDOSCOPY N/A 06/17/2019   Procedure: FLEXIBLE SIGMOIDOSCOPY;  Surgeon: San Sandor GAILS, DO;  Location: [facility];  Service: Gastroenterology;  Laterality: N/A;   HEMOSTASIS CLIP PLACEMENT  06/17/2019   Procedure: HEMOSTASIS CLIP PLACEMENT;  Surgeon: San Sandor GAILS, DO;  Location: [facility];  Service: Gastroenterology;;   LYMPH NODE DISSECTION Right 06/07/2013   Procedure: LYMPH NODE DISSECTION;  Surgeon: Dallas KATHEE Jude, MD;  Location: [facility] OR;  Service: Thoracic;  Laterality: Right;   PATCH ANGIOPLASTY Right 04/10/2018   Procedure: PATCH  ANGIOPLASTY OF RIGHT FEMORAL ARTERY USING BOVINE PATCH, PATCH ANGIOPLASTY OF RIGHT POPLITEAL ARTERY USING BOVINE PATCH;  Surgeon: Eliza Lonni RAMAN, MD;  Location: [facility] OR;  Service: Vascular;  Laterality: Right;   SCHLEROTHERAPY  06/17/2019   Procedure: WALDEMAR OF VARICES;  Surgeon: San Sandor GAILS, DO;  Location: [facility];  Service: Gastroenterology;;   THORACIC SYMPATHETECTOMY  1965   large incision from chest to up to shoulder, the nerves were tied together, for raynaud's   THORACOTOMY  06/07/2013   Procedure: MINI/LIMITED THORACOTOMY; right middle lobe wedge resection;  Surgeon: Dallas KATHEE Jude, MD;  Location: [facility] OR;  Service: Thoracic;;   TONSILLECTOMY  child   TOTAL ABDOMINAL HYSTERECTOMY  1980's    W/ BSO   TOTAL HIP ARTHROPLASTY Right 04/28/2014   Procedure: RIGHT TOTAL HIP ARTHROPLASTY ANTERIOR APPROACH;  Surgeon: Lonni GRADE Vernetta, MD;  Location: [facility];  Service: Orthopedics;   Laterality: Right;   TRANSTHORACIC ECHOCARDIOGRAM  12/11/2008   ef 45-50%, grade 1 diastolic dysfunction/  mild LAE/  mild AR and MR/  trivial TR   VIDEO ASSISTED THORACOSCOPY (VATS)/WEDGE RESECTION Right 06/07/2013   Procedure: VIDEO ASSISTED THORACOSCOPY (VATS)/right upper lobectomy, On Q;  Surgeon: Dallas KATHEE Jude, MD;  Location: [facility] OR;  Service: Thoracic;  Laterality: Right;   VIDEO BRONCHOSCOPY N/A 06/07/2013   Procedure: VIDEO BRONCHOSCOPY;  Surgeon: Dallas KATHEE Jude, MD;  Location: [facility] OR;  Service: Thoracic;  Laterality: N/A;   VIDEO BRONCHOSCOPY WITH ENDOBRONCHIAL NAVIGATION  N/A 05/04/2013   Procedure: VIDEO BRONCHOSCOPY WITH ENDOBRONCHIAL NAVIGATION;  Surgeon: Dallas KATHEE Jude, MD;  Location: [facility] OR;  Service: Thoracic;  Laterality: N/A;   Social History   Tobacco Use   Smoking status: Never    Passive exposure: Past   Smokeless tobacco: Never  Vaping Use   Vaping status: Never Used  Substance Use Topics   Alcohol  use: Not Currently   Drug use: Never   Social History   Social History Narrative   Lives with husband, daughter and grandchild local.   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 History  Administered Date(s) Administered   Fluad Quad(high Dose 65+) 11/03/2018, 12/14/2019   Fluad Trivalent(High Dose 65+) 01/21/2023   Fluzone Influenza virus vaccine,trivalent (IIV3), split virus 11/14/2015   INFLUENZA, HIGH DOSE SEASONAL PF 10/30/2012, 01/02/2015, 11/13/2016, 11/12/2017   Influenza Split 11/21/2011   Influenza Whole 11/29/2007, 11/29/2008, 11/29/2009   Influenza,inj,Quad PF,6+ Mos 11/22/2013, 11/14/2015   Influenza-Unspecified 11/13/2016, 11/12/2017, 12/14/2020   PFIZER(Purple Top)SARS-COV-2 Vaccination 04/12/2019, 05/03/2019, 01/20/2020, 12/14/2020   Pneumococcal Conjugate-13 05/22/2015   Pneumococcal Polysaccharide-23 06/13/2013   Tdap 08/17/2017   Zoster, Live 01/27/2014     Objective: Vital Signs: BP  106/64   Pulse 90   Temp 97.6 F (36.4 C)   Resp 16   Ht 5' (1.524 m)   Wt 109 lb 6.4 oz (49.6 kg)   BMI 21.37 kg/m    Physical Exam Vitals and nursing note reviewed.  Constitutional:      Appearance: She is well-developed.  HENT:     Head: Normocephalic and atraumatic.  Eyes:     Conjunctiva/sclera: Conjunctivae normal.  Cardiovascular:     Rate and Rhythm: Normal rate and regular rhythm.     Heart sounds: Normal heart sounds.  Pulmonary:     Effort: Pulmonary effort is normal.     Breath sounds: Normal breath sounds.  Abdominal:     General: Bowel sounds are normal.     Palpations: Abdomen is soft.  Musculoskeletal:     Cervical back: Normal range of motion.  Lymphadenopathy:     Cervical: No cervical adenopathy.  Skin:    General: Skin is warm and dry.     Capillary Refill: Capillary refill takes less than 2 seconds.     Comments: Surgical scar noted overlying left temporal region.   Neurological:     Mental Status: She is alert and oriented to person, place, and time.  Psychiatric:        Behavior: Behavior normal.      Musculoskeletal Exam: Patient remained seated during the examination today.  C-spine has good range of motion with lateral rotation.  Thoracic kyphosis and scoliosis noted.  Shoulder abduction limited to about 45 degrees.  Elbow joints and wrist joints have good range of motion.  Synovial thickening of all MCP joints.  Subluxation of several PIP and DIP joints.  PIP and DIP thickening consistent with osteoarthritic change noted.  Hip joints difficult to assess in seated position.  Knee joints have good range of motion with no warmth or effusion.  Ankle joints have good range of motion with no tenderness or joint swelling.  CDAI Exam: CDAI Score: -- Patient Global: --; Provider Global: -- Swollen: --; Tender: -- Joint Exam 12/24/2023   No joint exam has been documented for this visit   There is currently no information documented on the  homunculus. Go to the Rheumatology activity and complete the  homunculus joint exam.  Investigation: No additional findings.  Imaging: CT ANGIO CHEST AORTA W/CM & OR WO/CM Result Date: 12/19/2023 CLINICAL DATA:  Arteritis EXAM: CT ANGIOGRAPHY CHEST WITH CONTRAST TECHNIQUE: Multidetector CT imaging of the chest was performed using the standard protocol during bolus administration of intravenous contrast. Multiplanar CT image reconstructions and MIPs were obtained to evaluate the vascular anatomy. Multiplanar image (3D post-processing) reconstructions and MIPs were obtained to evaluate the vascular anatomy. RADIATION DOSE REDUCTION: This exam was performed according to the departmental dose-optimization program which includes automated exposure control, adjustment of the mA and/or kV according to patient size and/or use of iterative reconstruction technique. CONTRAST:  80mL OMNIPAQUE  IOHEXOL  350 MG/ML SOLN COMPARISON:  CT of the chest performed November 20, 2022 FINDINGS: Cardiovascular: Heart is enlarged. No pericardial effusion. Four vessel aortic arch. Proximal arch vessels are patent. Sinus of Valsalva: 29 x 29 x 28 mm Sinotubular junction: 28 x 26 mm Tubular ascending thoracic aorta: 32 x 31 mm Aortic arch: 27 x 25 mm Proximal descending thoracic aorta: 23 x 21 mm Descending thoracic aorta at the level of the aortic hiatus: 20 x 19 mm Main pulmonary artery: Within normal limits. Mediastinum/Nodes: No evidence of mediastinal lymphadenopathy. Hiatal hernia. Lungs/Pleura: Small right pleural effusion, increased from prior. Progression of multifocal pulmonary nodules. A represent left lower lobe nodule on image 56 of CT series 6 measures 1.1 x 0.8 cm, previously 0.6 cm. Patchy multifocal airspace opacities in the right upper lobe have increased. A representative nodule in the right lower lobe measures 1.5 x 1.0 cm, previously 1.2 x 1.0 cm. Image 59 of CT series 6. A representative lingular nodule measures 1.0 cm,  previously 0.8 cm, image 66 of CT series 6. Upper Abdomen: Reported separately. Musculoskeletal: Osteopenia and degenerative changes. Review of the MIP images confirms the above findings. IMPRESSION: 1. No thoracic aortic aneurysm, dissection, or focal flow-limiting stenosis. Mild atherosclerotic changes. 2. Imaging features of metastatic lung cancer with progression of pulmonary metastatic disease. 3. Small right pleural effusion, increased from prior exam. Electronically Signed   By: Maude Naegeli M.D.   On: 12/19/2023 14:31   CT ANGIO ABDOMEN W &/OR WO CONTRAST Result Date: 12/19/2023 CLINICAL DATA:  Aneurysm EXAM: CT ANGIOGRAPHY ABDOMEN TECHNIQUE: Multidetector CT imaging of the abdomen was performed using the standard protocol during bolus administration of intravenous contrast. Multiplanar reconstructed images and MIPs were obtained and reviewed to evaluate the vascular anatomy. RADIATION DOSE REDUCTION: This exam was performed according to the departmental dose-optimization program which includes automated exposure control, adjustment of the mA and/or kV according to patient size and/or use of iterative reconstruction technique. CONTRAST:  80mL OMNIPAQUE  IOHEXOL  350 MG/ML SOLN COMPARISON:  CT of the chest performed November 20, 2022 FINDINGS: VASCULAR Aorta: Ectatic without overt aneurysm. Mild atherosclerotic changes. Celiac: Patent. SMA: Patent. Renals: Single right renal artery, patent. 2 left renal arteries, patent. IMA: Patent. Inflow: Not evaluated. Veins: No obvious venous abnormality within the limitations of this arterial phase study. Review of the MIP images confirms the above findings. NON-VASCULAR Lower chest: Reported separately. Hepatobiliary: No focal liver lesion. No intrahepatic biliary ductal dilatation. The gallbladder is grossly unremarkable. Pancreas: Unremarkable. No pancreatic ductal dilatation or surrounding inflammatory changes. Spleen: Normal in size without focal abnormality.  Adrenals/Urinary Tract: The adrenal glands are within normal limits. Cortical thinning is present in both kidneys, worst on the right. Some motion related changes limit detail. Stomach/Bowel: Hiatal hernia. Lymphatic: No overt evidence of significant lymphadenopathy. Other: Nothing significant. Musculoskeletal:  Right convex lumbar curvature with severe degenerative spondylosis. IMPRESSION: 1. No abdominal aortic aneurysm. 2. Mild aortic atherosclerosis. Electronically Signed   By: Maude Naegeli M.D.   On: 12/19/2023 14:21    Recent Labs: Lab Results  Component Value Date   WBC 6.9 11/11/2023   HGB 11.0 (L) 11/11/2023   PLT 186.0 11/11/2023   NA 132 (L) 11/24/2023   K 5.4 (H) 11/24/2023   CL 91 (L) 11/24/2023   CO2 33 (H) 11/24/2023   GLUCOSE 93 11/24/2023   BUN 34 (H) 11/24/2023   CREATININE 1.05 (H) 11/24/2023   BILITOT 0.3 11/24/2023   ALKPHOS 34 (L) 06/22/2023   AST 19 11/24/2023   ALT 10 11/24/2023   PROT 7.0 11/24/2023   ALBUMIN  4.5 06/22/2023   CALCIUM  10.7 (H) 11/24/2023   GFRAA 60 05/21/2020   QFTBGOLDPLUS NEGATIVE 11/24/2023    Speciality Comments: TX (elevated LFTs) and leflunomide  (hair loss) Plaquenil  (had macular changes)  Orencia  - 7/21 Prolia  - 04/22/19, 11/09/19, 06/06/20,12/20/20  Fu q 3 months  Procedures:  No procedures performed Allergies: Amlodipine , Prochlorperazine edisylate, Aspirin , Cymbalta [duloxetine hcl], and Pamelor  [nortriptyline  hcl]   Assessment / Plan:     Visit Diagnoses: Temporal arteritis (HCC) - Biopsy proven 11/18/2023: Marked acute and chronic arteritis consistent with temporal arteritis: Patient was started on Actemra 162 mg sq injections on 12/08/2023.  She has tolerated initiating Actemra without any side effects or injection site reactions.  No recent or recurrent infections.  She took prednisone  35 mg daily for the past 2 weeks and has reduced to 30 mg starting today.  She has had difficulty sleeping on this dose of prednisone  and  presented today to discuss the prednisone  taper.  Advised the patient to notify us  how she does with taking 30 mg daily for the next few days--if she continues to have difficulty sleeping we can reduce to 25 mg sooner than 2 weeks.  The plan will be to try to taper by 5 mg every week until reaching 12.5 mg.  Plan to follow the taper used in the Actemra trial--we will further discuss the taper at the next visit as long as she has no recurrence of symptoms.  Discussed plan with Dr. Dolphus today-in agreement.  She will remain on bactrim  as prescribed.   CT angio of the abdomen performed on 12/18/2023: No abdominal aortic aneurysm noted.  CT angio chest 12/18/2023: No thoracic aortic aneurysm, dissection, or focal flow-limiting stenosis.  Mild atherosclerotic changes noted.  Imaging features of metastatic lung cancer with progression of pulmonary metastatic disease noted--she will be following up with Dr. Gatha on 02/03/2024.  No recurrence of symptoms.  No visual changes.  No temporal artery tenderness.  No signs of jaw claudication.  No signs or symptoms of polymyalgia rheumatica. She was advised to notify us  if she develops any new or worsening symptoms. She will follow-up in the office in 1 month.  Rheumatoid arthritis of multiple sites with negative rheumatoid factor (HCC) - RF negative, anti-CCP negative: Previous therapy: Methotrexate -elevated LFTs, Arava -hair loss, Plaquenil  discontinued due to macular changes: She has no synovitis on examination today.  Patient continues to have intermittent pain in both shoulders, both hands, both hips.  She has not been experiencing any morning stiffness.  Most of her discomfort seems to be due to underlying osteoarthritis--she has tenderness over multiple DIP joints involving both hands. She will remain on Actemra 162 mg subcu days injections once every 14 days.  She will continue to follow the  prednisone  taper as discussed.  She was advised to notify us  if she  develops any signs or symptoms of a flare.  She will continue to require close follow-up.  High risk medication use -Actemra 162 mg sq injections once every 14 days--initiated on 12/08/2023. Currently taking prednisone  30 mg daily-reduced from 35 mg daily yesterday.  Previous therapy: IV Orencia .   TB gold negative on 11/24/23.  CMP Updated on 11/24/23. CBC updated on 11/11/23.  Due to update lab work in 2 weeks-orders remain in place.   Discussed the importance of holding actemra if she develops signs or symptoms of an infection and to resume once the infection is completely cleared.  Elevated sed rate: ESR 86 and CRP 29.7 on 11/11/2023. Currently following the prednisone  taper as prescribed.   Sicca syndrome: Not currently symptomatic.   Raynaud's disease without gangrene: Diagnosed in 1960s-Intermittent symptoms of raynaud's.  No signs of sclerodactyly.    Status post total replacement of right hip - 01/2018 revision by Dr. Vernetta. X-rays of the right hip were updated on 09/12/2021 by Dr. Vernetta.  Age-related osteoporosis without current pathological fracture - DEXA 01/28/2019:BMD Femur Neck is 0.636 g/cm2 with a T-score of -2.9.  Patient has declined updating her bone density. She is prescribed prolia  60 mg sq injections every 6 months. Patient received a Prolia  injection today.  Other idiopathic scoliosis, thoracolumbar region: Intermittent discomfort.  Using a rollator walker to assist with ambulation.  Other medical conditions are listed as follows:  Malignant neoplasm of upper lobe of right lung (HCC)  Anemia of chronic disease  Adrenal gland hyperfunction  Essential hypertension: Blood pressure was 106/64 today in the office.  History of recurrent UTIs  Dysuria  Elevated cholesterol  Orders: No orders of the defined types were placed in this encounter.  No orders of the defined types were placed in this encounter.    Follow-Up Instructions: Return in about 4  weeks (around 01/21/2024).   Waddell CHRISTELLA Craze, PA-C  Note - This record has been created using Dragon software.  Chart creation errors have been sought, but may not always  have been located. Such creation errors do not reflect on  the standard of medical care.

## 2023-12-16 ENCOUNTER — Other Ambulatory Visit: Payer: Self-pay

## 2023-12-16 ENCOUNTER — Other Ambulatory Visit (HOSPITAL_COMMUNITY): Payer: Self-pay

## 2023-12-16 NOTE — Progress Notes (Signed)
 Specialty Pharmacy Initial Fill Coordination Note  [name] is a 89 y.o. female contacted today regarding initial fill of specialty medication(s) Tocilizumab (Actemra ACTPen)   Patient requested Delivery   Delivery date: 12/18/23   Verified address: [address]   [place] [address]   Medication will be filled on: 12/17/23   Patient is aware of $100 copayment. Credit card info collected and forwarded to [facility] at [facility].

## 2023-12-17 ENCOUNTER — Telehealth: Payer: Self-pay | Admitting: Pharmacist

## 2023-12-17 NOTE — Telephone Encounter (Signed)
 Received notification from [employer] regarding a prior authorization for PROLIA  [id number]). Authorization has been APPROVED from 02/18/2024 to 02/16/2025. Approval letter sent to scan center.  Reference # [id number] Phone # [phone]  [name], PharmD, MPH, BCPS, CPP Clinical Pharmacist [facility])

## 2023-12-18 ENCOUNTER — Ambulatory Visit (HOSPITAL_COMMUNITY)
Admission: RE | Admit: 2023-12-18 | Discharge: 2023-12-18 | Disposition: A | Discharge status: Home / Self Care | Source: Ambulatory Visit | Attending: Rheumatology | Admitting: Rheumatology

## 2023-12-18 DIAGNOSIS — I700 Atherosclerosis of aorta: Secondary | ICD-10-CM | POA: Diagnosis not present

## 2023-12-18 DIAGNOSIS — C3490 Malignant neoplasm of unspecified part of unspecified bronchus or lung: Secondary | ICD-10-CM | POA: Diagnosis not present

## 2023-12-18 DIAGNOSIS — M316 Other giant cell arteritis: Secondary | ICD-10-CM | POA: Diagnosis not present

## 2023-12-18 DIAGNOSIS — K449 Diaphragmatic hernia without obstruction or gangrene: Secondary | ICD-10-CM | POA: Diagnosis not present

## 2023-12-18 DIAGNOSIS — J90 Pleural effusion, not elsewhere classified: Secondary | ICD-10-CM | POA: Diagnosis not present

## 2023-12-18 MED ORDER — IOHEXOL 350 MG/ML SOLN
100.0000 mL | Freq: Once | INTRAVENOUS | Status: AC | PRN
Start: 2023-12-18 — End: 2023-12-18
  Administered 2023-12-18: 80 mL via INTRAVENOUS

## 2023-12-18 NOTE — Progress Notes (Signed)
 Patient started Actemra SQ in office on 12/08/2023 for recently-diagnosed GCA and RA. Refer to clinical support note.  Plan to continue Actemra 162mg  subcut every 14 days (spacing due to low BW and due to history of recurrent infections) with prednisone  taper managed closely by MDO.  [name], PharmD, MPH, BCPS, CPP Clinical Pharmacist [facility])

## 2023-12-19 ENCOUNTER — Other Ambulatory Visit: Payer: Self-pay

## 2023-12-20 NOTE — Progress Notes (Signed)
 Per radiology report -CT angio of the chest shows no vascular involvement except for mild calcification.  Progression of the metastatic lung cancer was noted.  Small pleural effusion was noted.  Please forward results to her oncologist and PCP.

## 2023-12-20 NOTE — Progress Notes (Signed)
 Abdominal CT scan is unremarkable per radiology report except for mild calcification of the blood vessels which is expected with the aging process.

## 2023-12-21 ENCOUNTER — Encounter: Payer: Self-pay | Admitting: Radiology

## 2023-12-22 ENCOUNTER — Other Ambulatory Visit: Payer: Self-pay

## 2023-12-23 ENCOUNTER — Other Ambulatory Visit: Payer: Self-pay | Admitting: Family Medicine

## 2023-12-23 ENCOUNTER — Telehealth: Payer: Self-pay | Admitting: Rheumatology

## 2023-12-23 ENCOUNTER — Telehealth: Payer: Self-pay

## 2023-12-23 NOTE — Telephone Encounter (Signed)
 Patient confirmed her two appointments on 12/17 for labs and follow up scan review Dr. Sherrod.

## 2023-12-23 NOTE — Telephone Encounter (Signed)
 Kylie from pre [facility] at [facility] called in regards for the ct head and neck on 11/6 with patient. Needs PA with her insurance. Number left was [phone] ex [phone]

## 2023-12-24 ENCOUNTER — Ambulatory Visit (HOSPITAL_COMMUNITY)
Admission: RE | Admit: 2023-12-24 | Discharge: 2023-12-24 | Disposition: A | Discharge status: Home / Self Care | Source: Ambulatory Visit | Attending: Physician Assistant | Admitting: Physician Assistant

## 2023-12-24 ENCOUNTER — Ambulatory Visit (HOSPITAL_COMMUNITY)

## 2023-12-24 ENCOUNTER — Encounter: Attending: Rheumatology | Admitting: Physician Assistant

## 2023-12-24 ENCOUNTER — Encounter: Payer: Self-pay | Admitting: Physician Assistant

## 2023-12-24 ENCOUNTER — Other Ambulatory Visit: Payer: Self-pay | Admitting: Cardiology

## 2023-12-24 VITALS — BP 106/64 | HR 90 | Temp 97.6°F | Resp 16 | Ht 60.0 in | Wt 109.4 lb

## 2023-12-24 VITALS — BP 128/68 | HR 89 | Temp 97.2°F | Resp 17

## 2023-12-24 DIAGNOSIS — Z87440 Personal history of urinary (tract) infections: Secondary | ICD-10-CM | POA: Diagnosis not present

## 2023-12-24 DIAGNOSIS — M810 Age-related osteoporosis without current pathological fracture: Secondary | ICD-10-CM | POA: Insufficient documentation

## 2023-12-24 DIAGNOSIS — R300 Dysuria: Secondary | ICD-10-CM | POA: Diagnosis not present

## 2023-12-24 DIAGNOSIS — M316 Other giant cell arteritis: Secondary | ICD-10-CM | POA: Insufficient documentation

## 2023-12-24 DIAGNOSIS — R700 Elevated erythrocyte sedimentation rate: Secondary | ICD-10-CM | POA: Insufficient documentation

## 2023-12-24 DIAGNOSIS — Z79899 Other long term (current) drug therapy: Secondary | ICD-10-CM | POA: Diagnosis not present

## 2023-12-24 DIAGNOSIS — M3500 Sicca syndrome, unspecified: Secondary | ICD-10-CM | POA: Diagnosis not present

## 2023-12-24 DIAGNOSIS — C3411 Malignant neoplasm of upper lobe, right bronchus or lung: Secondary | ICD-10-CM | POA: Insufficient documentation

## 2023-12-24 DIAGNOSIS — E7800 Pure hypercholesterolemia, unspecified: Secondary | ICD-10-CM | POA: Diagnosis not present

## 2023-12-24 DIAGNOSIS — M0609 Rheumatoid arthritis without rheumatoid factor, multiple sites: Secondary | ICD-10-CM | POA: Insufficient documentation

## 2023-12-24 DIAGNOSIS — I7300 Raynaud's syndrome without gangrene: Secondary | ICD-10-CM | POA: Insufficient documentation

## 2023-12-24 DIAGNOSIS — Z96641 Presence of right artificial hip joint: Secondary | ICD-10-CM | POA: Insufficient documentation

## 2023-12-24 DIAGNOSIS — E270 Other adrenocortical overactivity: Secondary | ICD-10-CM | POA: Insufficient documentation

## 2023-12-24 DIAGNOSIS — I10 Essential (primary) hypertension: Secondary | ICD-10-CM | POA: Insufficient documentation

## 2023-12-24 DIAGNOSIS — M4125 Other idiopathic scoliosis, thoracolumbar region: Secondary | ICD-10-CM | POA: Insufficient documentation

## 2023-12-24 DIAGNOSIS — D638 Anemia in other chronic diseases classified elsewhere: Secondary | ICD-10-CM | POA: Insufficient documentation

## 2023-12-24 MED ORDER — DENOSUMAB 60 MG/ML ~~LOC~~ SOSY
60.0000 mg | PREFILLED_SYRINGE | Freq: Once | SUBCUTANEOUS | Status: AC
  Administered 2023-12-24: 60 mg via SUBCUTANEOUS

## 2023-12-24 MED ORDER — DENOSUMAB 60 MG/ML ~~LOC~~ SOSY
PREFILLED_SYRINGE | SUBCUTANEOUS | Status: AC
  Filled 2023-12-24: qty 1

## 2023-12-24 NOTE — Telephone Encounter (Signed)
 I called patient, Per [facility], auth needed, notes faxed to [phone], phone [phone], pending shara ADJUTANT #[id number]

## 2023-12-25 ENCOUNTER — Ambulatory Visit (HOSPITAL_COMMUNITY)

## 2024-01-04 ENCOUNTER — Ambulatory Visit (HOSPITAL_COMMUNITY)

## 2024-01-07 ENCOUNTER — Other Ambulatory Visit: Payer: Self-pay

## 2024-01-07 ENCOUNTER — Other Ambulatory Visit (HOSPITAL_COMMUNITY): Payer: Self-pay

## 2024-01-07 NOTE — Progress Notes (Signed)
 Specialty Pharmacy Refill Coordination Note  [name] is a 89 y.o. female contacted today regarding refills of specialty medication(s) Tocilizumab (Actemra ACTPen)   Patient requested (Patient-Rptd) Delivery   Delivery date: 01/13/24   Verified address: [address] [name], [address]   Medication will be filled on: 01/12/24

## 2024-01-12 ENCOUNTER — Other Ambulatory Visit: Payer: Self-pay

## 2024-01-18 ENCOUNTER — Other Ambulatory Visit: Payer: Self-pay

## 2024-01-18 MED ORDER — PREDNISONE 2.5 MG PO TABS: ORAL_TABLET | ORAL | 0 refills | Status: AC

## 2024-01-18 NOTE — Telephone Encounter (Signed)
 Patient and her daughter called in regards to prednisone  dose and needing a new rx.   She is currently on prednisone  15mg  daily and per the patients daughter, she is using a pill cutter to achieve the 15mg  dose. But they are requesting a new rx for the 2.5mg  tablets to achieve the 12.5mg  dose.  She has enough 10mg  tablets right now, so the only need is the 2.5mg  tablets.  Per office note on 12/24/2023: The plan will be to try to taper by 5 mg every week until reaching 12.5 mg.  Plan to follow the taper used in the Actemra  trial--we will further discuss the taper at the next visit as long as she has no recurrence of symptoms.    Please review and sign pended prednisone  2.5mg  rx. Thanks!

## 2024-01-18 NOTE — Telephone Encounter (Signed)
 Advised patient that prednisone  2.5mg  rx was sent to the pharmacy, she verbalized understanding.

## 2024-01-20 ENCOUNTER — Telehealth (HOSPITAL_COMMUNITY): Payer: Self-pay

## 2024-01-20 NOTE — Telephone Encounter (Signed)
 Auth Submission: APPROVED Site of care: Site of care: [facility] Payer: [employer] Medicare Medication & CPT/J Code(s) submitted: Prolia  (Denosumab ) [id number] Diagnosis Code: M81.0 Route of submission (phone, fax, portal):  Phone # Fax # Auth type: Buy/Bill HB Units/visits requested: 60mg  q29months Reference number: [id number] Approval from: 02/18/24 to 02/16/25

## 2024-01-21 NOTE — Progress Notes (Unsigned)
 Office Visit Note  Patient: [name]             Date of Birth: Jul 06, 1930           MRN: [id number]             PCP: Watt Harlene BROCKS, MD Referring: Watt Harlene BROCKS, MD Visit Date: 02/03/2024 Occupation: Data Unavailable  Subjective:  No chief complaint on file.   History of Present Illness: [name] is a 89 y.o. female ***     Activities of Daily Living:  Patient reports morning stiffness for *** {minute/hour:19697}.   Patient {ACTIONS;DENIES/REPORTS:21021675::Denies} nocturnal pain.  Difficulty dressing/grooming: {ACTIONS;DENIES/REPORTS:21021675::Denies} Difficulty climbing stairs: {ACTIONS;DENIES/REPORTS:21021675::Denies} Difficulty getting out of chair: {ACTIONS;DENIES/REPORTS:21021675::Denies} Difficulty using hands for taps, buttons, cutlery, and/or writing: {ACTIONS;DENIES/REPORTS:21021675::Denies}  No Rheumatology ROS completed.   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   HTN (hypertension) 04/10/2018   Femoral artery pseudo-aneurysm, right 04/09/2018   Anemia of chronic disease 02/24/2018   Unstable right hip arthroplasty 01/24/2018   History of revision of total replacement of right hip joint 01/24/2018   Hypovolemic shock (HCC)    Hyperkalemia    Hyponatremia    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   Status post revision of total hip 11/27/2017   Elevated  cholesterol 10/11/2015   Adrenal gland hyperfunction 10/04/2014   Bilateral leg edema 08/01/2014   Elevated BP 08/01/2014   Rheumatoid arthritis involving multiple joints (HCC) 05/30/2014   Status post total replacement of right hip 04/28/2014   Long-term use of high-risk medication 11/11/2013   Symptomatic anemia 11/11/2013   Neuropathic pain 07/07/2013   Constipation due to pain medication 07/07/2013   Protein-calorie malnutrition, severe (HCC) 06/08/2013   Lung cancer, Right upper lobe 05/08/2013   Sciatica of right side 08/13/2011   Osteoporosis 03/07/2010   PARESTHESIA 03/07/2010   Low back pain 03/16/2009   CT, CHEST, ABNORMAL 12/18/2008   ABNORMAL ECHOCARDIOGRAM 12/14/2008   SJOGREN'S SYNDROME 11/29/2008   HYPOGLYCEMIA 06/29/2006   RAYNAUD'S DISEASE 06/29/2006    Past Medical History:  Diagnosis Date   Arthralgia of multiple joints    followed by dr ziolkowska   Arthritis    C. difficile colitis 08/2022   Cardiomyopathy (HCC)    CHF (congestive heart failure) (HCC)    Chronic constipation    Chronic inflammatory arthritis    rhemotolgist-  dr a. ziolkowska ([facility])   Dry eyes    eye drops used    GERD (gastroesophageal reflux disease)    H/O discoid lupus erythematosus    Hiatal hernia    History of colon polyps    Hypothyroidism    Iron deficiency anemia    LBBB (left bundle branch block) 2010   Mitchell's disease (erythromelalgia)    neurologist-  dr patel   Nocturia    Non-small cell cancer of right lung [facility]) surgeon-- dr gerhardt/  oncologist-  dr sherrod--- per lov notes no  recurrence/   11-18-2017 per pt denies any symptoms   dx 2015--  Stage IIA (T2b,N0,M0) , +EGFR  mutation in exon 21, non-small cell adenocarcinoma right upper lobe---  s/p  Right upper lobectomy , right middley wedge resection and node dissection---  no chemo or radiation therapy   OA (osteoarthritis)    hands   Osteoporosis    PONV (postoperative nausea and vomiting)     likes phenergan    Raynaud's phenomenon 1965   Renal insufficiency    Rheumatoid arthritis (HCC)    Sciatica    Scoliosis    Sjogren's syndrome     Family History  Problem Relation Age of Onset   Other Mother 59       MVA   Coronary artery disease Father    Colon cancer Father    Diabetes Father    Cancer Father        colon   Healthy Sister    Pneumonia Sister    Healthy Brother    Other Brother        killed in war   Hypothyroidism Daughter    Healthy Daughter    Esophageal cancer Neg Hx    Kidney disease Neg Hx    Liver disease Neg Hx    Past Surgical History:  Procedure Laterality Date   ANTERIOR HIP REVISION Right 11/27/2017   Procedure: RIGHT HIP ACETABULAR REVISION;  Surgeon: Vernetta Lonni GRADE, MD;  Location: [facility];  Service: Orthopedics;  Laterality: Right;   ANTERIOR HIP REVISION Right 01/24/2018   Procedure: OPEN REDUCTION OF DISLOCATED ANTERIOR HIP WITH REVISION OF LINER AND HIP BALL;  Surgeon: Vernetta Lonni GRADE, MD;  Location: [facility];  Service: Orthopedics;  Laterality: Right;   APPENDECTOMY  1950s   ARTERY BIOPSY Left 11/18/2023   Procedure: BIOPSY TEMPORAL ARTERY;  Surgeon: Gretta Lonni PARAS, MD;  Location: [facility] OR;  Service: Vascular;  Laterality: Left;   BIOPSY  04/14/2018   Procedure: BIOPSY;  Surgeon: Leigh Elspeth SQUIBB, MD;  Location: [facility];  Service: Gastroenterology;;   BIOPSY  04/16/2018   Procedure: BIOPSY;  Surgeon: Wilhelmenia Aloha Raddle., MD;  Location: [facility] ENDOSCOPY;  Service: Gastroenterology;;   CARDIOVASCULAR STRESS TEST  12/2008    mild fixed basal to mid septal perfusion defect felt likely due to artifact from LBBB, no ischemia, EF 58%   COLONOSCOPY     COLONOSCOPY WITH PROPOFOL  N/A 04/16/2018   Procedure: COLONOSCOPY WITH PROPOFOL ;  Surgeon: Wilhelmenia Aloha Raddle., MD;  Location: [facility] ENDOSCOPY;  Service: Gastroenterology;  Laterality: N/A;   ESOPHAGOGASTRODUODENOSCOPY (EGD) WITH PROPOFOL  N/A 04/14/2018   Procedure:  ESOPHAGOGASTRODUODENOSCOPY (EGD) WITH PROPOFOL ;  Surgeon: Leigh Elspeth SQUIBB, MD;  Location: [facility] ENDOSCOPY;  Service: Gastroenterology;  Laterality: N/A;   FEMORAL-POPLITEAL BYPASS GRAFT Right 04/10/2018   Procedure: REPAIR RIGHT FEMORAL ARTERY PSEUDOANEURYSM, RETROPERITONEAL EXPOSURE OF ILIAC ARTERY, RIGHT POPLITEAL EMBOLECTOMY;  Surgeon: Eliza Lonni RAMAN, MD;  Location: [facility] OR;  Service: Vascular;  Laterality: Right;   FLEXIBLE SIGMOIDOSCOPY N/A 06/17/2019   Procedure: FLEXIBLE SIGMOIDOSCOPY;  Surgeon: San Sandor GAILS, DO;  [facility];  Service: Gastroenterology;  Laterality: N/A;   HEMOSTASIS CLIP PLACEMENT  06/17/2019   Procedure: HEMOSTASIS CLIP PLACEMENT;  Surgeon: San Sandor GAILS, DO;  Location: [facility];  Service: Gastroenterology;;   LYMPH NODE DISSECTION Right 06/07/2013   Procedure: LYMPH NODE DISSECTION;  Surgeon: Dallas KATHEE Jude, MD;  Location: [facility] OR;  Service: Thoracic;  Laterality: Right;   PATCH ANGIOPLASTY Right 04/10/2018   Procedure: PATCH  ANGIOPLASTY  OF RIGHT FEMORAL ARTERY USING BOVINE PATCH, PATCH ANGIOPLASTY OF RIGHT POPLITEAL ARTERY USING BOVINE PATCH;  Surgeon: Eliza Lonni RAMAN, MD;  Location: [facility] OR;  Service: Vascular;  Laterality: Right;   SCHLEROTHERAPY  06/17/2019   Procedure: WALDEMAR OF VARICES;  Surgeon: San Sandor GAILS, DO;  Location: [facility];  Service: Gastroenterology;;   THORACIC SYMPATHETECTOMY  1965   large incision from chest to up to shoulder, the nerves were tied together, for raynaud's   THORACOTOMY  06/07/2013   Procedure: MINI/LIMITED THORACOTOMY; right middle lobe wedge resection;  Surgeon: Dallas KATHEE Jude, MD;  Location: [facility] OR;  Service: Thoracic;;   TONSILLECTOMY  child   TOTAL ABDOMINAL HYSTERECTOMY  1980's    W/ BSO   TOTAL HIP ARTHROPLASTY Right 04/28/2014   Procedure: RIGHT TOTAL HIP ARTHROPLASTY ANTERIOR APPROACH;  Surgeon: Lonni CINDERELLA Poli, MD;  Location: [facility];  Service: Orthopedics;   Laterality: Right;   TRANSTHORACIC ECHOCARDIOGRAM  12/11/2008   ef 45-50%, grade 1 diastolic dysfunction/  mild LAE/  mild AR and MR/  trivial TR   VIDEO ASSISTED THORACOSCOPY (VATS)/WEDGE RESECTION Right 06/07/2013   Procedure: VIDEO ASSISTED THORACOSCOPY (VATS)/right upper lobectomy, On Q;  Surgeon: Dallas KATHEE Jude, MD;  Location: [facility] OR;  Service: Thoracic;  Laterality: Right;   VIDEO BRONCHOSCOPY N/A 06/07/2013   Procedure: VIDEO BRONCHOSCOPY;  Surgeon: Dallas KATHEE Jude, MD;  Location: [facility] OR;  Service: Thoracic;  Laterality: N/A;   VIDEO BRONCHOSCOPY WITH ENDOBRONCHIAL NAVIGATION N/A 05/04/2013   Procedure: VIDEO BRONCHOSCOPY WITH ENDOBRONCHIAL NAVIGATION;  Surgeon: Dallas KATHEE Jude, MD;  Location: [facility] OR;  Service: Thoracic;  Laterality: N/A;   Social History   Tobacco Use   Smoking status: Never    Passive exposure: Past   Smokeless tobacco: Never  Vaping Use   Vaping status: Never Used  Substance Use Topics   Alcohol  use: Not Currently   Drug use: Never   Social History   Social History Narrative   Lives with husband, daughter and grandchild local.   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 History  Administered Date(s) Administered   Fluad Quad(high Dose 65+) 11/03/2018, 12/14/2019   Fluad Trivalent(High Dose 65+) 01/21/2023   Fluzone Influenza virus vaccine,trivalent (IIV3), split virus 11/14/2015   INFLUENZA, HIGH DOSE SEASONAL PF 10/30/2012, 01/02/2015, 11/13/2016, 11/12/2017   Influenza Split 11/21/2011   Influenza Whole 11/29/2007, 11/29/2008, 11/29/2009   Influenza,inj,Quad PF,6+ Mos 11/22/2013, 11/14/2015   Influenza-Unspecified 11/13/2016, 11/12/2017, 12/14/2020   PFIZER(Purple Top)SARS-COV-2 Vaccination 04/12/2019, 05/03/2019, 01/20/2020, 12/14/2020   Pneumococcal Conjugate-13 05/22/2015   Pneumococcal Polysaccharide-23 06/13/2013   Tdap 08/17/2017   Zoster, Live 01/27/2014     Objective: Vital Signs: There  were no vitals taken for this visit.   Physical Exam   Musculoskeletal Exam: ***  CDAI Exam: CDAI Score: -- Patient Global: --; Provider Global: -- Swollen: --; Tender: -- Joint Exam 02/03/2024   No joint exam has been documented for this visit   There is currently no information documented on the homunculus. Go to the Rheumatology activity and complete the homunculus joint exam.  Investigation: No additional findings.  Imaging: No results found.  Recent Labs: Lab Results  Component Value Date   WBC 6.9 11/11/2023   HGB 11.0 (L) 11/11/2023   PLT 186.0 11/11/2023   NA 132 (L) 11/24/2023   K 5.4 (H) 11/24/2023   CL 91 (L) 11/24/2023   CO2 33 (H) 11/24/2023   GLUCOSE 93 11/24/2023   BUN  34 (H) 11/24/2023   CREATININE 1.05 (H) 11/24/2023   BILITOT 0.3 11/24/2023   ALKPHOS 34 (L) 06/22/2023   AST 19 11/24/2023   ALT 10 11/24/2023   PROT 7.0 11/24/2023   ALBUMIN  4.5 06/22/2023   CALCIUM  10.7 (H) 11/24/2023   GFRAA 60 05/21/2020   QFTBGOLDPLUS NEGATIVE 11/24/2023    Speciality Comments: TX (elevated LFTs) and leflunomide  (hair loss) Plaquenil  (had macular changes)  Orencia  - 7/21 Prolia  - 04/22/19, 11/09/19, 06/06/20,12/20/20  Fu q 3 months  Procedures:  No procedures performed Allergies: Amlodipine , Prochlorperazine edisylate, Aspirin , Cymbalta [duloxetine hcl], and Pamelor  [nortriptyline  hcl]   Assessment / Plan:     Visit Diagnoses: No diagnosis found.  Orders: No orders of the defined types were placed in this encounter.  No orders of the defined types were placed in this encounter.   Face-to-face time spent with patient was *** minutes. Greater than 50% of time was spent in counseling and coordination of care.  Follow-Up Instructions: No follow-ups on file.   Daved JAYSON Gavel, CMA  Note - This record has been created using Animal nutritionist.  Chart creation errors have been sought, but may not always  have been located. Such creation errors do not  reflect on  the standard of medical care.

## 2024-01-27 ENCOUNTER — Ambulatory Visit (HOSPITAL_COMMUNITY)
Admission: RE | Admit: 2024-01-27 | Discharge: 2024-01-27 | Disposition: A | Discharge status: Home / Self Care | Source: Ambulatory Visit | Attending: Rheumatology | Admitting: Rheumatology

## 2024-01-27 ENCOUNTER — Encounter (HOSPITAL_COMMUNITY): Payer: Self-pay

## 2024-01-27 ENCOUNTER — Encounter: Payer: Self-pay | Admitting: Family Medicine

## 2024-01-27 ENCOUNTER — Other Ambulatory Visit: Payer: Self-pay | Admitting: *Deleted

## 2024-01-27 ENCOUNTER — Ambulatory Visit: Payer: Self-pay | Admitting: Rheumatology

## 2024-01-27 DIAGNOSIS — M316 Other giant cell arteritis: Secondary | ICD-10-CM | POA: Diagnosis present

## 2024-01-27 DIAGNOSIS — R7989 Other specified abnormal findings of blood chemistry: Secondary | ICD-10-CM

## 2024-01-27 DIAGNOSIS — E162 Hypoglycemia, unspecified: Secondary | ICD-10-CM | POA: Diagnosis present

## 2024-01-27 LAB — POCT I-STAT CREATININE: Creatinine, Ser: 1.9 mg/dL — ABNORMAL HIGH (ref 0.44–1.00)

## 2024-01-27 MED ORDER — IOHEXOL 350 MG/ML SOLN: 75.0000 mL | Freq: Once | INTRAVENOUS | Status: DC | PRN

## 2024-01-27 NOTE — Progress Notes (Signed)
 Creatinine is elevated at 1.90.  Please notify patient and forward results to her PCP.  Patient should contact her PCP for the evaluation of elevated creatinine.

## 2024-01-28 ENCOUNTER — Telehealth: Payer: Self-pay

## 2024-01-28 NOTE — Telephone Encounter (Signed)
 Spoke with patient regarding her CT scan. She reported that she arrived for her scheduled scan appointment, but Dr. Jammie office called and instructed her to cancel the scans. She notified our office due to her follow-up appointment with Dr. Sherrod on 02/03/24. Informed patient to contact our office once her scan is rescheduled so that a follow-up visit can be arranged approximately one week afterward. Patient voiced understanding.

## 2024-02-01 ENCOUNTER — Other Ambulatory Visit (INDEPENDENT_AMBULATORY_CARE_PROVIDER_SITE_OTHER)

## 2024-02-01 ENCOUNTER — Other Ambulatory Visit: Payer: Self-pay

## 2024-02-01 DIAGNOSIS — R7989 Other specified abnormal findings of blood chemistry: Secondary | ICD-10-CM

## 2024-02-01 NOTE — Addendum Note (Signed)
 Addended by: WATT RAISIN C on: 02/01/2024 08:28 AM   Modules accepted: Orders

## 2024-02-02 ENCOUNTER — Telehealth: Payer: Self-pay | Admitting: Family Medicine

## 2024-02-02 ENCOUNTER — Encounter: Payer: Self-pay | Admitting: Internal Medicine

## 2024-02-02 LAB — BASIC METABOLIC PANEL WITH GFR
BUN: 36 mg/dL — ABNORMAL HIGH (ref 6–23)
CO2: 29 meq/L (ref 19–32)
Calcium: 9 mg/dL (ref 8.4–10.5)
Chloride: 96 meq/L (ref 96–112)
Creatinine, Ser: 1.41 mg/dL — ABNORMAL HIGH (ref 0.40–1.20)
GFR: 32.68 mL/min — ABNORMAL LOW (ref >60.00)
Glucose, Bld: 90 mg/dL (ref 70–99)
Potassium: 5.2 meq/L — ABNORMAL HIGH (ref 3.5–5.1)
Sodium: 134 meq/L — ABNORMAL LOW (ref 135–145)

## 2024-02-02 LAB — BRAIN NATRIURETIC PEPTIDE: Pro B Natriuretic peptide (BNP): 630 pg/mL — ABNORMAL HIGH (ref 0.0–100.0)

## 2024-02-02 NOTE — Telephone Encounter (Signed)
 Received labs and called pt-   Her weight is usually 107- 109lbs She notes her weight was 120 maybe 3 days ago, she urinated a lot and was down to 116 yesterday.  Her right leg is more swollen than her left but this is typical for her She feels like her SOB is just slightly worse than normal for her   She is seeing Dr Dolphus tomorrow for recheck; they have her on prednisone  now for her TA and are slowly weaning down   She does have some furosemide  on hand- she has 20 mg at home and she will take one now and take one tomorrow am as well  I will check on her tomorrow and will loop in her cardiologist Dr Pietro.  Results for orders placed or performed in visit on 02/01/24  B Nat Peptide   Collection Time: 02/01/24  3:30 PM  Result Value Ref Range   Pro B Natriuretic peptide (BNP) 630.0 (H) 0.0 - 100.0 pg/mL  Basic metabolic panel with GFR   Collection Time: 02/01/24  3:30 PM  Result Value Ref Range   Sodium 134 (L) 135 - 145 mEq/L   Potassium 5.2 No hemolysis seen (H) 3.5 - 5.1 mEq/L   Chloride 96 96 - 112 mEq/L   CO2 29 19 - 32 mEq/L   Glucose, Bld 90 70 - 99 mg/dL   BUN 36 (H) 6 - 23 mg/dL   Creatinine, Ser 8.58 (H) 0.40 - 1.20 mg/dL   GFR 67.31 (L) >39.99 mL/min   Calcium  9.0 8.4 - 10.5 mg/dL   *Note: Due to a large number of results and/or encounters for the requested time period, some results have not been displayed. A complete set of results can be found in Results Review.   Her GFR has improved but not back to baseline as of yet  BNP is also high  Echo done 7/24-  Left ventricular ejection fraction, by estimation, is 40 to 45%. The  left ventricle has mildly decreased function. The left ventricle  demonstrates global hypokinesis. The left ventricular internal cavity size  was severely dilated. Left ventricular  diastolic parameters are consistent with Grade I diastolic dysfunction  (impaired relaxation).   Cardiologist is Dr Pietro

## 2024-02-03 ENCOUNTER — Encounter: Attending: Rheumatology | Admitting: Rheumatology

## 2024-02-03 ENCOUNTER — Encounter: Payer: Self-pay | Admitting: Rheumatology

## 2024-02-03 ENCOUNTER — Inpatient Hospital Stay

## 2024-02-03 ENCOUNTER — Inpatient Hospital Stay: Admitting: Internal Medicine

## 2024-02-03 VITALS — BP 133/73 | HR 76 | Temp 97.8°F | Resp 17 | Ht 60.0 in | Wt 114.0 lb

## 2024-02-03 DIAGNOSIS — Z96641 Presence of right artificial hip joint: Secondary | ICD-10-CM | POA: Diagnosis not present

## 2024-02-03 DIAGNOSIS — C3411 Malignant neoplasm of upper lobe, right bronchus or lung: Secondary | ICD-10-CM | POA: Diagnosis not present

## 2024-02-03 DIAGNOSIS — Z87440 Personal history of urinary (tract) infections: Secondary | ICD-10-CM | POA: Insufficient documentation

## 2024-02-03 DIAGNOSIS — Z79899 Other long term (current) drug therapy: Secondary | ICD-10-CM | POA: Insufficient documentation

## 2024-02-03 DIAGNOSIS — M810 Age-related osteoporosis without current pathological fracture: Secondary | ICD-10-CM | POA: Diagnosis not present

## 2024-02-03 DIAGNOSIS — R700 Elevated erythrocyte sedimentation rate: Secondary | ICD-10-CM | POA: Diagnosis not present

## 2024-02-03 DIAGNOSIS — E559 Vitamin D deficiency, unspecified: Secondary | ICD-10-CM | POA: Insufficient documentation

## 2024-02-03 DIAGNOSIS — D638 Anemia in other chronic diseases classified elsewhere: Secondary | ICD-10-CM | POA: Insufficient documentation

## 2024-02-03 DIAGNOSIS — M0609 Rheumatoid arthritis without rheumatoid factor, multiple sites: Secondary | ICD-10-CM | POA: Diagnosis not present

## 2024-02-03 DIAGNOSIS — Z7952 Long term (current) use of systemic steroids: Secondary | ICD-10-CM | POA: Insufficient documentation

## 2024-02-03 DIAGNOSIS — E270 Other adrenocortical overactivity: Secondary | ICD-10-CM | POA: Insufficient documentation

## 2024-02-03 DIAGNOSIS — I7300 Raynaud's syndrome without gangrene: Secondary | ICD-10-CM | POA: Insufficient documentation

## 2024-02-03 DIAGNOSIS — R300 Dysuria: Secondary | ICD-10-CM | POA: Insufficient documentation

## 2024-02-03 DIAGNOSIS — M316 Other giant cell arteritis: Secondary | ICD-10-CM | POA: Insufficient documentation

## 2024-02-03 DIAGNOSIS — R600 Localized edema: Secondary | ICD-10-CM | POA: Insufficient documentation

## 2024-02-03 DIAGNOSIS — E7800 Pure hypercholesterolemia, unspecified: Secondary | ICD-10-CM | POA: Insufficient documentation

## 2024-02-03 DIAGNOSIS — M4125 Other idiopathic scoliosis, thoracolumbar region: Secondary | ICD-10-CM | POA: Diagnosis not present

## 2024-02-03 DIAGNOSIS — M3500 Sicca syndrome, unspecified: Secondary | ICD-10-CM | POA: Insufficient documentation

## 2024-02-03 DIAGNOSIS — I10 Essential (primary) hypertension: Secondary | ICD-10-CM | POA: Insufficient documentation

## 2024-02-03 MED ORDER — PREDNISONE 5 MG PO TABS: 5.0000 mg | ORAL_TABLET | Freq: Every day | ORAL | 0 refills | Status: AC

## 2024-02-03 MED ORDER — PREDNISONE 1 MG PO TABS: ORAL_TABLET | ORAL | 0 refills | Status: DC

## 2024-02-03 NOTE — Patient Instructions (Addendum)
 Standing Labs We placed an order today for your standing lab work.   Please have your standing labs drawn in March and every 3 months  Please have your labs drawn 2 weeks prior to your appointment so that the provider can discuss your lab results at your appointment, if possible.  Please note that you may see your imaging and lab results in MyChart before we have reviewed them. We will contact you once all results are reviewed. Please allow our office up to 72 hours to thoroughly review all of the results before contacting the office for clarification of your results.  WALK-IN LAB HOURS  Monday through Thursday from 8:00 am - 4:30 pm and Friday from 8:00 am-12:00 pm.  Patients with office visits requiring labs will be seen before walk-in labs.  You may encounter longer than normal wait times. Please allow additional time. Wait times may be shorter on  Monday and Thursday afternoons.  We do not book appointments for walk-in labs. We appreciate your patience and understanding with our staff.   Labs are drawn by [facility]. Please bring your co-pay at the time of your lab draw.  You may receive a bill from [facility] for your lab work.  Please note if you are on Hydroxychloroquine  and and an order has been placed for a Hydroxychloroquine  level,  you will need to have it drawn 4 hours or more after your last dose.  If you wish to have your labs drawn at another location, please call the office 24 hours in advance so we can fax the orders.  The office is located at [address], [place], [place] [address]   If you have any questions regarding directions or hours of operation,  please call [phone].   As a reminder, please drink plenty of water  prior to coming for your lab work. Thanks!   Vaccines You are taking a medication(s) that can suppress your immune system.  The following immunizations are recommended: Flu annually Covid-19  RSV Td/Tdap (tetanus, diphtheria, pertussis)  every 10 years Pneumonia (Prevnar 15 then Pneumovax 23  at least 1 year apart.  Alternatively, can take Prevnar 20 without needing additional dose) Shingrix: 2 doses from 4 weeks to 6 months apart  Please check with your PCP to make sure you are up to date.   If you have signs or symptoms of an infection or start antibiotics: First, call your PCP for workup of your infection. Hold your medication through the infection, until you complete your antibiotics, and until symptoms resolve if you take the following: Injectable medication (Actemra , Benlysta, Cimzia, Cosentyx, Enbrel, Humira, Kevzara, Orencia , Remicade, Simponi, Stelara, Taltz, Tremfya) Methotrexate Leflunomide  (Arava ) Mycophenolate (Cellcept) Earma, Olumiant, or Rinvoq   Prednisone  taper: Prednisone  12.5 mg for 2 weeks, 10 mg for 1 week  9 mg for 1 week  8 mg for 1 week  7 mg for 1 week   6 mg for 2 weeks,   5 mg for 2 weeks  4 mg for 2 weeks   3 mg for 2 weeks  2 mg for 2 weeks   1 mg for 2 weeks

## 2024-02-03 NOTE — Telephone Encounter (Signed)
-----   Message from Harlene Schroeder, MD sent at 02/02/2024  2:22 PM EST ----- Call and check on her

## 2024-02-03 NOTE — Telephone Encounter (Signed)
 I called her- she is doing ok, does not feel that she needs to be seen right now. She will keep me posted

## 2024-02-04 ENCOUNTER — Other Ambulatory Visit: Payer: Self-pay

## 2024-02-04 ENCOUNTER — Encounter: Payer: Self-pay | Admitting: Internal Medicine

## 2024-02-04 LAB — LIPID PANEL
Cholesterol: 262 mg/dL — ABNORMAL HIGH (ref <200)
HDL: 95 mg/dL (ref >=50)
LDL Cholesterol (Calc): 139 mg/dL — ABNORMAL HIGH
Non-HDL Cholesterol (Calc): 167 mg/dL — ABNORMAL HIGH (ref <130)
Total CHOL/HDL Ratio: 2.8 (calc) (ref <5.0)
Triglycerides: 150 mg/dL — ABNORMAL HIGH (ref <150)

## 2024-02-04 LAB — CBC WITH DIFFERENTIAL/PLATELET
Absolute Lymphocytes: 715 {cells}/uL — ABNORMAL LOW (ref 850–3900)
Absolute Monocytes: 423 {cells}/uL (ref 200–950)
Basophils Absolute: 33 {cells}/uL (ref 0–200)
Basophils Relative: 0.5 %
Eosinophils Absolute: 20 {cells}/uL (ref 15–500)
Eosinophils Relative: 0.3 %
HCT: 36.9 % (ref 35.9–46.0)
Hemoglobin: 11.9 g/dL (ref 11.7–15.5)
MCH: 30.1 pg (ref 27.0–33.0)
MCHC: 32.2 g/dL (ref 31.6–35.4)
MCV: 93.2 fL (ref 81.4–101.7)
MPV: 10.3 fL (ref 7.5–12.5)
Monocytes Relative: 6.5 %
Neutro Abs: 5311 {cells}/uL (ref 1500–7800)
Neutrophils Relative %: 81.7 %
Platelets: 222 Thousand/uL (ref 140–400)
RBC: 3.96 Million/uL (ref 3.80–5.10)
RDW: 15 % (ref 11.0–15.0)
Total Lymphocyte: 11 %
WBC: 6.5 Thousand/uL (ref 3.8–10.8)

## 2024-02-04 LAB — ALT: ALT: 19 U/L (ref 6–29)

## 2024-02-04 LAB — VITAMIN D 25 HYDROXY (VIT D DEFICIENCY, FRACTURES): Vit D, 25-Hydroxy: 40 ng/mL (ref 30–100)

## 2024-02-04 LAB — AST: AST: 27 U/L (ref 10–35)

## 2024-02-04 NOTE — Progress Notes (Signed)
 Specialty Pharmacy Refill Coordination Note  [name] is a 89 y.o. female contacted today regarding refills of specialty medication(s) Tocilizumab  (Actemra  ACTPen)   Patient requested Delivery   Delivery date: 02/16/24   Verified address: [address] [name], [address]   Medication will be filled on: 02/15/24

## 2024-02-04 NOTE — Progress Notes (Signed)
 Lymphocyte count is low due to immunosuppression.  LDL and triglycerides are elevated.  Vitamin D  is in the normal range.  Liver functions are normal.  Please forward results to her PCP.

## 2024-02-05 ENCOUNTER — Other Ambulatory Visit: Payer: Self-pay | Admitting: Family Medicine

## 2024-02-05 ENCOUNTER — Encounter: Payer: Self-pay | Admitting: Internal Medicine

## 2024-02-08 ENCOUNTER — Ambulatory Visit (HOSPITAL_COMMUNITY): Admission: RE | Admit: 2024-02-08 | Discharge: 2024-02-08 | Attending: Rheumatology | Admitting: Rheumatology

## 2024-02-08 ENCOUNTER — Ambulatory Visit (HOSPITAL_COMMUNITY)
Admission: RE | Admit: 2024-02-08 | Discharge: 2024-02-08 | Disposition: A | Discharge status: Home / Self Care | Source: Ambulatory Visit | Attending: Rheumatology | Admitting: Rheumatology

## 2024-02-08 DIAGNOSIS — I771 Stricture of artery: Secondary | ICD-10-CM | POA: Diagnosis not present

## 2024-02-08 DIAGNOSIS — M316 Other giant cell arteritis: Secondary | ICD-10-CM | POA: Diagnosis present

## 2024-02-08 DIAGNOSIS — J90 Pleural effusion, not elsewhere classified: Secondary | ICD-10-CM | POA: Insufficient documentation

## 2024-02-15 ENCOUNTER — Other Ambulatory Visit: Payer: Self-pay

## 2024-02-15 ENCOUNTER — Telehealth: Payer: Self-pay

## 2024-02-15 ENCOUNTER — Telehealth: Payer: Self-pay | Admitting: Rheumatology

## 2024-02-15 DIAGNOSIS — Z79899 Other long term (current) drug therapy: Secondary | ICD-10-CM

## 2024-02-15 MED ORDER — SULFAMETHOXAZOLE-TRIMETHOPRIM 800-160 MG PO TABS: ORAL_TABLET | ORAL | 0 refills | Status: AC

## 2024-02-15 NOTE — Telephone Encounter (Signed)
 Spoke with patient in regards to follow up visit with Dr. Sherrod. Patient states she had CT angio of the chest and abdomen on 12/18/23 by Dr. Jammie office.  Patient informed me that she went for her CT scan of the chest and couldn't tolerate the dye. Informed patient I would let Dr. Sherrod know and get his recommendations.  Made follow up appt with Dr. Sherrod on 02/24/24 @ 130 PM.

## 2024-02-15 NOTE — Telephone Encounter (Signed)
 Called patient to schedule 3 month follow-up appointment.  Patient stated that she had her MRI of her neck and head on 02/08/24 and is asking if Dr. Dolphus would call with the results or if she needs to schedule an appointment.  I explained that once Dr. Dolphus reviews the results we will call back to schedule an appointment.

## 2024-02-15 NOTE — Telephone Encounter (Signed)
 Patient called for refill of bactrim  to [facility] on [address].   Last Fill: 11/25/2023  Next Visit: due March 2026, message sent to the front desk to schedule.   Last Visit: 02/03/2024  DX: High risk medication use   Current Dose per office note on 02/03/2024: She is on Bactrim  prophylaxis.   Okay to refill bactrim ?

## 2024-02-16 ENCOUNTER — Other Ambulatory Visit: Payer: Self-pay

## 2024-02-16 ENCOUNTER — Telehealth: Payer: Self-pay

## 2024-02-16 DIAGNOSIS — C3411 Malignant neoplasm of upper lobe, right bronchus or lung: Secondary | ICD-10-CM

## 2024-02-16 NOTE — Telephone Encounter (Signed)
 Spoke with patient this morning regarding Dr. Blas recommendation for a CT scan of the chest. Informed patient that Dr. Sherrod would like the CT scan performed without contrast because patient states she could not tolerate the dye. Explained that the MR angiogram of the head and neck will not visualize the chest area. Order placed for CT chest without contrast. Patient states she has the contact number to call and schedule the scan. Advised patient to notify our office once the scan is scheduled so a follow-up appointment with Dr. Sherrod can be arranged approximately one week after completion of the CT scan. Patients previously scheduled appointment with Dr. Sherrod on 02/24/23 was canceled pending completion of the CT scan. Patient voiced understanding.

## 2024-02-17 ENCOUNTER — Encounter (HOSPITAL_COMMUNITY): Payer: Self-pay | Admitting: Physician Assistant

## 2024-02-17 ENCOUNTER — Encounter: Payer: Self-pay | Admitting: Internal Medicine

## 2024-02-17 NOTE — Telephone Encounter (Signed)
 Spoke with patient in regards to appt to review scan.  Patients CT scan is scheduled for 03/03/24 and visit with Dr. Sherrod is scheduled for 03/15/24 @ 1045 AM.  She voiced understanding.

## 2024-02-19 ENCOUNTER — Encounter: Payer: Self-pay | Admitting: Internal Medicine

## 2024-02-21 ENCOUNTER — Ambulatory Visit: Payer: Self-pay | Admitting: Rheumatology

## 2024-02-21 ENCOUNTER — Other Ambulatory Visit: Payer: Self-pay | Admitting: Physician Assistant

## 2024-02-21 NOTE — Progress Notes (Signed)
 MRA of the neck and head did not show any inflammation of the blood vessels.  There were some changes in the blood vessels due to long-term effects of high blood pressure and aging process.  Small amount of fluid was noted in the right lung.  I will forward results to the care team.

## 2024-02-23 NOTE — Progress Notes (Signed)
 Hi Rakesh, I forwarded the report to you not realizing that she is not seen you in several years.  She is not symptomatic.  Thank you

## 2024-02-24 ENCOUNTER — Telehealth: Payer: Self-pay | Admitting: Physician Assistant

## 2024-02-24 ENCOUNTER — Inpatient Hospital Stay: Admitting: Internal Medicine

## 2024-02-24 NOTE — Telephone Encounter (Signed)
 Pt called to cancel appt because she's not getting a scan

## 2024-02-25 ENCOUNTER — Ambulatory Visit (INDEPENDENT_AMBULATORY_CARE_PROVIDER_SITE_OTHER): Admitting: *Deleted

## 2024-02-25 VITALS — Ht 60.0 in | Wt 110.0 lb

## 2024-02-25 DIAGNOSIS — Z0000 Encounter for general adult medical examination without abnormal findings: Secondary | ICD-10-CM

## 2024-02-25 NOTE — Patient Instructions (Addendum)
 Ms. Keng,  Thank you for taking the time for your Medicare Wellness Visit. I appreciate your continued commitment to your health goals. Please review the care plan we discussed, and feel free to reach out if I can assist you further.  Please note that Annual Wellness Visits do not include a physical exam. Some assessments may be limited, especially if the visit was conducted virtually. If needed, we may recommend an in-person follow-up with your provider.  Ongoing Care Seeing your primary care provider every 3 to 6 months helps us  monitor your health and provide consistent, personalized care.   Dr Watt: 05/11/24 10:40am Medicare AWV: 02/27/25 1pm, telephone  Recommended Screenings:  Health Maintenance  Topic Date Due   COVID-19 Vaccine (5 - 2025-26 season) 03/12/2024*   Flu Shot  05/17/2024*   Medicare Annual Wellness Visit  02/24/2025   DTaP/Tdap/Td vaccine (2 - Td or Tdap) 08/18/2027   Pneumococcal Vaccine for age over 39  Completed   Osteoporosis screening with Bone Density Scan  Completed   Meningitis B Vaccine  Aged Out   Breast Cancer Screening  Discontinued   Zoster (Shingles) Vaccine  Discontinued  *Topic was postponed. The date shown is not the original due date.       02/25/2024    1:09 PM  Advanced Directives  Does Patient Have a Medical Advance Directive? Yes  Type of Advance Directive Living will;Healthcare Power of Attorney  Does patient want to make changes to medical advance directive? No - Patient declined  Copy of Healthcare Power of Attorney in Chart? No - copy requested  Would patient like information on creating a medical advance directive? No - Patient declined   Please bring a copy of your health care power of attorney and living will to the office to be added to your chart at your convenience. You can mail a copy to [facility] [address] [place], [place] [address] or email to [email or web address]  Vision: Annual vision  screenings are recommended for early detection of glaucoma, cataracts, and diabetic retinopathy. These exams can also reveal signs of chronic conditions such as diabetes and high blood pressure.  Dental: Annual dental screenings help detect early signs of oral cancer, gum disease, and other conditions linked to overall health, including heart disease and diabetes.  Please see the attached documents for additional preventive care recommendations.

## 2024-02-25 NOTE — Progress Notes (Signed)
 "  Chief Complaint  Patient presents with   Medicare Wellness     Subjective:   [name] is a 89 y.o. female who presents for a Medicare Annual Wellness Visit.  Visit info / Clinical Intake: Medicare Wellness Visit Type:: Subsequent Annual Wellness Visit Persons participating in visit and providing information:: patient Medicare Wellness Visit Mode:: Telephone If telephone:: video declined Since this visit was completed virtually, some vitals may be partially provided or unavailable. Missing vitals are due to the limitations of the virtual format.: Unable to obtain vitals - no equipment If Telephone or Video please confirm:: I connected with patient using audio/video enable telemedicine. I verified patient identity with two identifiers, discussed telehealth limitations, and patient agreed to proceed. Patient Location:: home Provider Location:: office Interpreter Needed?: No Pre-visit prep was completed: yes AWV questionnaire completed by patient prior to visit?: no Living arrangements:: (!) lives alone Patient's Overall Health Status Rating: good (I am pleased to live by myself and be independant) Typical amount of pain: (!) a lot Does pain affect daily life?: (!) yes Are you currently prescribed opioids?: (!) yes  Dietary Habits and Nutritional Risks How many meals a day?: 2 Eats fruit and vegetables daily?: yes Most meals are obtained by: preparing own meals; having others provide food In the last 2 weeks, have you had any of the following?: none Diabetic:: no  Functional Status Activities of Daily Living (to include ambulation/medication): Independent Ambulation: Independent (Uses walker at church and cane at home) Home Assistive Devices/Equipment: Vannie (specify Type) Medication Administration: Independent Home Management (perform basic housework or laundry): Independent Manage your own finances?: yes Primary transportation is: family / friends Concerns about  vision?: no *vision screening is required for [facility]* (up to date with Dr Honey) Concerns about hearing?: no  Fall Screening Falls in the past year?: 0 Number of falls in past year: 0 Was there an injury with Fall?: 0 Fall Risk Category Calculator: 0 Patient Fall Risk Level: Low Fall Risk  Fall Risk Patient at Risk for Falls Due to: Orthopedic patient Fall risk Follow up: Falls evaluation completed  Home and Transportation Safety: All rugs have non-skid backing?: yes (under furniture) All stairs or steps have railings?: yes Grab bars in the bathtub or shower?: (!) no Have non-skid surface in bathtub or shower?: yes Good home lighting?: yes Regular seat belt use?: yes Hospital stays in the last year:: no  Cognitive Assessment Difficulty concentrating, remembering, or making decisions? : no Will 6CIT or Mini Cog be Completed: yes What year is it?: 0 points What month is it?: 0 points Give patient an address phrase to remember (5 components): [address], [place]  About what time is it?: 0 points Count backwards from 20 to 1: 0 points Say the months of the year in reverse: 0 points Repeat the address phrase from earlier: 0 points 6 CIT Score: 0 points  Advance Directives (For Healthcare) Does Patient Have a Medical Advance Directive?: Yes Does patient want to make changes to medical advance directive?: No - Patient declined Type of Advance Directive: Living will; Healthcare Power of Attorney Copy of Healthcare Power of Attorney in Chart?: No - copy requested Copy of Living Will in Chart?: No - copy requested Would patient like information on creating a medical advance directive?: No - Patient declined  Reviewed/Updated  Reviewed/Updated: Reviewed All (Medical, Surgical, Family, Medications, Allergies, Care Teams, Patient Goals)    Allergies (verified) Amlodipine , Prochlorperazine edisylate, Aspirin , Cymbalta [duloxetine hcl], and Pamelor  [nortriptyline   hcl]   Current Medications (verified) Outpatient Encounter Medications as of 02/25/2024  Medication Sig   acetaminophen  (TYLENOL ) 500 MG tablet Take 1 tablet (500 mg total) by mouth every 6 (six) hours as needed (pain).   Biotin  1000 MCG tablet Take 1,000 mcg by mouth 2 (two) times daily.   carvedilol  (COREG ) 6.25 MG tablet TAKE 1 TABLET BY MOUTH 2 TIMES DAILY WITH A MEAL.   denosumab  (PROLIA ) 60 MG/ML SOSY injection Inject 60 mg into the skin every 6 (six) months.   estradiol  (ESTRACE ) 0.1 MG/GM vaginal cream Place one gram vaginally up to three times a week as needed to maintain comfort   furosemide  (LASIX ) 40 MG tablet Take 40 mg by mouth daily.   gabapentin  (NEURONTIN ) 300 MG capsule Take 1 capsule (300 mg total) by mouth 3 (three) times daily. (Patient taking differently: Take 300 mg by mouth 3 (three) times daily. Takes it twice a day)   HYDROcodone -acetaminophen  (NORCO/VICODIN) 5-325 MG tablet Take 1 tablet by mouth as needed for moderate pain (pain score 4-6).   levothyroxine  (SYNTHROID ) 75 MCG tablet TAKE 1 TABLET BY MOUTH DAILY BEFORE BREAKFAST.   losartan  (COZAAR ) 25 MG tablet Take 1 tablet (25 mg total) by mouth daily. (Patient taking differently: Take 25 mg by mouth daily. Takes as needed for elevated BP)   Multiple Vitamin (MULTIVITAMIN) tablet Take 1 tablet by mouth daily.   OMEPRAZOLE  PO Take by mouth.   ondansetron  (ZOFRAN ) 4 MG tablet Take 1 tablet (4 mg total) by mouth every 8 (eight) hours as needed for nausea or vomiting.   predniSONE  (DELTASONE ) 1 MG tablet Take 4 tablets po daily x 1 week. Then 3 tabs po daily x 1 week. Then 2 tabs po daily x 1 week. Then 1 tab po daily x 1 week. Take along with 5 mg tablet and follow prednisone  taper given in office. Take in the morning with breakfast. Do not take with NSAIDS.   predniSONE  (DELTASONE ) 2.5 MG tablet Take 1 tablet by mouth daily, along with one 10mg  tablet.   predniSONE  (DELTASONE ) 5 MG tablet Take 1 tablet (5 mg total) by  mouth daily with breakfast. Take in the morning with breakfast. Do not take with NSAIDS. Take along with 1 mg tablet and follow taper schedule.   Probiotic Product (PROBIOTIC PO) Take 1 capsule by mouth daily.    Tocilizumab  (ACTEMRA  ACTPEN) 162 MG/0.9ML SOAJ Inject 162 mg into the skin every 14 (fourteen) days.   traMADol  (ULTRAM ) 50 MG tablet TAKE 1/2-1 TABLET BY MOUTH EVERY 8 HOURS AS NEEDED. DO NOT COMBINE WITH OTHER PAIN MEDICATION   predniSONE  (DELTASONE ) 10 MG tablet Take 3.5 tablets once daily for 2 weeks then 3 tablets once daily for 2 weeks (Patient not taking: Reported on 02/25/2024)   sulfamethoxazole -trimethoprim  (BACTRIM  DS) 800-160 MG tablet Take one tablet by mouth Monday, Wednesday, and Friday. (Patient not taking: Reported on 02/25/2024)   No facility-administered encounter medications on file as of 02/25/2024.    History: Past Medical History:  Diagnosis Date   Arthralgia of multiple joints    followed by dr ziolkowska   Arthritis    C. difficile colitis 08/2022   Cardiomyopathy (HCC)    CHF (congestive heart failure) (HCC)    Chronic constipation    Chronic inflammatory arthritis    rhemotolgist-  dr a. ziolkowska ([facility])   Dry eyes    eye drops used    GERD (gastroesophageal reflux disease)    H/O discoid lupus erythematosus  Hiatal hernia    History of colon polyps    Hypothyroidism    Iron deficiency anemia    LBBB (left bundle branch block) 2010   Mitchell's disease (erythromelalgia)    neurologist-  dr patel   Nocturia    Non-small cell cancer of right lung [facility]) surgeon-- dr gerhardt/  oncologist-  dr sherrod--- per lov notes no recurrence/   11-18-2017 per pt denies any symptoms   dx 2015--  Stage IIA (T2b,N0,M0) , +EGFR  mutation in exon 21, non-small cell adenocarcinoma right upper lobe---  s/p  Right upper lobectomy , right middley wedge resection and node dissection---  no chemo or radiation therapy   OA (osteoarthritis)    hands    Osteoporosis    PONV (postoperative nausea and vomiting)    likes phenergan    Raynaud's phenomenon 1965   Renal insufficiency    Rheumatoid arthritis (HCC)    Sciatica    Scoliosis    Sjogren's syndrome    Past Surgical History:  Procedure Laterality Date   ANTERIOR HIP REVISION Right 11/27/2017   Procedure: RIGHT HIP ACETABULAR REVISION;  Surgeon: Vernetta Lonni GRADE, MD;  Location: [facility];  Service: Orthopedics;  Laterality: Right;   ANTERIOR HIP REVISION Right 01/24/2018   Procedure: OPEN REDUCTION OF DISLOCATED ANTERIOR HIP WITH REVISION OF LINER AND HIP BALL;  Surgeon: Vernetta Lonni GRADE, MD;  Location: [facility];  Service: Orthopedics;  Laterality: Right;   APPENDECTOMY  1950s   ARTERY BIOPSY Left 11/18/2023   Procedure: BIOPSY TEMPORAL ARTERY;  Surgeon: Gretta Lonni PARAS, MD;  Location: [facility] OR;  Service: Vascular;  Laterality: Left;   BIOPSY  04/14/2018   Procedure: BIOPSY;  Surgeon: Leigh Elspeth SQUIBB, MD;  Location: [facility];  Service: Gastroenterology;;   BIOPSY  04/16/2018   Procedure: BIOPSY;  Surgeon: Wilhelmenia Aloha Raddle., MD;  Location: [facility] ENDOSCOPY;  Service: Gastroenterology;;   CARDIOVASCULAR STRESS TEST  12/2008    mild fixed basal to mid septal perfusion defect felt likely due to artifact from LBBB, no ischemia, EF 58%   COLONOSCOPY     COLONOSCOPY WITH PROPOFOL  N/A 04/16/2018   Procedure: COLONOSCOPY WITH PROPOFOL ;  Surgeon: Wilhelmenia Aloha Raddle., MD;  Location: [facility] ENDOSCOPY;  Service: Gastroenterology;  Laterality: N/A;   ESOPHAGOGASTRODUODENOSCOPY (EGD) WITH PROPOFOL  N/A 04/14/2018   Procedure: ESOPHAGOGASTRODUODENOSCOPY (EGD) WITH PROPOFOL ;  Surgeon: Leigh Elspeth SQUIBB, MD;  Location: [facility] ENDOSCOPY;  Service: Gastroenterology;  Laterality: N/A;   FEMORAL-POPLITEAL BYPASS GRAFT Right 04/10/2018   Procedure: REPAIR RIGHT FEMORAL ARTERY PSEUDOANEURYSM, RETROPERITONEAL EXPOSURE OF ILIAC ARTERY, RIGHT POPLITEAL EMBOLECTOMY;  Surgeon: Eliza Lonni RAMAN, MD;  Location: [facility] OR;  Service: Vascular;  Laterality: Right;   FLEXIBLE SIGMOIDOSCOPY N/A 06/17/2019   Procedure: FLEXIBLE SIGMOIDOSCOPY;  Surgeon: San Sandor GAILS, DO;  [facility]: [facility];  Service: Gastroenterology;  Laterality: N/A;   HEMOSTASIS CLIP PLACEMENT  06/17/2019   Procedure: HEMOSTASIS CLIP PLACEMENT;  Surgeon: San Sandor GAILS, DO;  Location: [facility];  Service: Gastroenterology;;   LYMPH NODE DISSECTION Right 06/07/2013   Procedure: LYMPH NODE DISSECTION;  Surgeon: Dallas KATHEE Jude, MD;  Location: [facility] OR;  Service: Thoracic;  Laterality: Right;   PATCH ANGIOPLASTY Right 04/10/2018   Procedure: PATCH  ANGIOPLASTY OF RIGHT FEMORAL ARTERY USING BOVINE PATCH, PATCH ANGIOPLASTY OF RIGHT POPLITEAL ARTERY USING BOVINE PATCH;  Surgeon: Eliza Lonni RAMAN, MD;  Location: [facility] OR;  Service: Vascular;  Laterality: Right;   SCHLEROTHERAPY  06/17/2019   Procedure: WALDEMAR OF VARICES;  Surgeon: Cirigliano, Vito V,  DO;  Location: [facility];  Service: Gastroenterology;;   THORACIC SYMPATHETECTOMY  1965   large incision from chest to up to shoulder, the nerves were tied together, for raynaud's   THORACOTOMY  06/07/2013   Procedure: MINI/LIMITED THORACOTOMY; right middle lobe wedge resection;  Surgeon: Dallas KATHEE Jude, MD;  Location: [facility] OR;  Service: Thoracic;;   TONSILLECTOMY  child   TOTAL ABDOMINAL HYSTERECTOMY  1980's    W/ BSO   TOTAL HIP ARTHROPLASTY Right 04/28/2014   Procedure: RIGHT TOTAL HIP ARTHROPLASTY ANTERIOR APPROACH;  Surgeon: Lonni CINDERELLA Poli, MD;  Location: [facility];  Service: Orthopedics;  Laterality: Right;   TRANSTHORACIC ECHOCARDIOGRAM  12/11/2008   ef 45-50%, grade 1 diastolic dysfunction/  mild LAE/  mild AR and MR/  trivial TR   VIDEO ASSISTED THORACOSCOPY (VATS)/WEDGE RESECTION Right 06/07/2013   Procedure: VIDEO ASSISTED THORACOSCOPY (VATS)/right upper lobectomy, On Q;  Surgeon: Dallas KATHEE Jude, MD;  Location: [facility] OR;  Service: Thoracic;   Laterality: Right;   VIDEO BRONCHOSCOPY N/A 06/07/2013   Procedure: VIDEO BRONCHOSCOPY;  Surgeon: Dallas KATHEE Jude, MD;  Location: [facility] OR;  Service: Thoracic;  Laterality: N/A;   VIDEO BRONCHOSCOPY WITH ENDOBRONCHIAL NAVIGATION N/A 05/04/2013   Procedure: VIDEO BRONCHOSCOPY WITH ENDOBRONCHIAL NAVIGATION;  Surgeon: Dallas KATHEE Jude, MD;  Location: [facility] OR;  Service: Thoracic;  Laterality: N/A;   Family History  Problem Relation Age of Onset   Other Mother 46       MVA   Coronary artery disease Father    Colon cancer Father    Diabetes Father    Cancer Father        colon   Healthy Sister    Pneumonia Sister    Healthy Brother    Other Brother        killed in war   Hypothyroidism Daughter    Healthy Daughter    Esophageal cancer Neg Hx    Kidney disease Neg Hx    Liver disease Neg Hx    Social History   Occupational History   Occupation: n/a  Tobacco Use   Smoking status: Never    Passive exposure: Past   Smokeless tobacco: Never  Vaping Use   Vaping status: Never Used  Substance and Sexual Activity   Alcohol  use: Not Currently   Drug use: Never   Sexual activity: Not Currently    Birth control/protection: Surgical   Tobacco Counseling Counseling given: Not Answered  SDOH Screenings   Food Insecurity: No Food Insecurity (08/25/2022)  Housing: Low Risk (08/19/2022)  Transportation Needs: No Transportation Needs (02/25/2024)  Utilities: Not At Risk (08/19/2022)  Alcohol  Screen: Low Risk (01/21/2023)  Depression (PHQ2-9): Low Risk (02/25/2024)  Financial Resource Strain: Low Risk (08/13/2022)  Physical Activity: Unknown (08/13/2022)  Social Connections: Moderately Integrated (08/13/2022)  Stress: No Stress Concern Present (02/25/2024)  Tobacco Use: Low Risk (02/25/2024)  Health Literacy: Adequate Health Literacy (01/21/2023)   See flowsheets for full screening details  Depression Screen PHQ 2 & 9 Depression Scale- Over the past 2 weeks, how often have you been bothered by any of  the following problems? Little interest or pleasure in doing things: 0 Feeling down, depressed, or hopeless (PHQ Adolescent also includes...irritable): 0 PHQ-2 Total Score: 0 Trouble falling or staying asleep, or sleeping too much: 3 (Wakes up very early every day) Feeling tired or having little energy: 0 Poor appetite or overeating (PHQ Adolescent also includes...weight loss): 0 Feeling bad about yourself - or that you are a failure or have  let yourself or your family down: 0 Trouble concentrating on things, such as reading the newspaper or watching television (PHQ Adolescent also includes...like school work): 0 Moving or speaking so slowly that other people could have noticed. Or the opposite - being so fidgety or restless that you have been moving around a lot more than usual: 0 Thoughts that you would be better off dead, or of hurting yourself in some way: 0 PHQ-9 Total Score: 3  Depression Treatment Depression Interventions/Treatment : EYV7-0 Score <4 Follow-up Not Indicated     Goals Addressed   None          Objective:    Today's Vitals   02/25/24 1259  Weight: 110 lb (49.9 kg)  Height: 5' (1.524 m)   Body mass index is 21.48 kg/m.  Hearing/Vision screen No results found. Immunizations and Health Maintenance Health Maintenance  Topic Date Due   COVID-19 Vaccine (5 - 2025-26 season) 03/12/2024 (Originally 10/19/2023)   Influenza Vaccine  05/17/2024 (Originally 09/18/2023)   Medicare Annual Wellness (AWV)  02/24/2025   DTaP/Tdap/Td (2 - Td or Tdap) 08/18/2027   Pneumococcal Vaccine: 50+ Years  Completed   Bone Density Scan  Completed   Meningococcal B Vaccine  Aged Out   Mammogram  Discontinued   Zoster Vaccines- Shingrix  Discontinued        Assessment/Plan:  This is a routine wellness examination for [name].  Patient Care Team: Copland, Harlene BROCKS, MD as PCP - General (Family Medicine) Pietro Redell RAMAN, MD as PCP - Cardiology (Cardiology) Jude Harden GAILS, MD  as Consulting Physician (Pulmonary Disease) Ziolkowska, Aldona, MD as Consulting Physician (Internal Medicine) Sherrod Sherrod, MD as Consulting Physician (Oncology) Army Dallas NOVAK, MD (Inactive) as Consulting Physician (Cardiothoracic Surgery) Darlean Ned, MD as Consulting Physician (Rehabilitation) Dolphus Reiter, MD as Consulting Physician (Rheumatology) Katheleen Rush, OD as Physician Assistant (Optometry)  I have personally reviewed and noted the following in the patients chart:   Medical and social history Use of alcohol , tobacco or illicit drugs  Current medications and supplements including opioid prescriptions. Functional ability and status Nutritional status Physical activity Advanced directives List of other physicians Hospitalizations, surgeries, and ER visits in previous 12 months Vitals Screenings to include cognitive, depression, and falls Referrals and appointments  No orders of the defined types were placed in this encounter.  In addition, I have reviewed and discussed with patient certain preventive protocols, quality metrics, and best practice recommendations. A written personalized care plan for preventive services as well as general preventive health recommendations were provided to patient.   Lolita Libra, CMA   02/25/2024   Return in 1 year (on 02/24/2025).  After Visit Summary: (MyChart) Due to this being a telephonic visit, the after visit summary with patients personalized plan was offered to patient via MyChart   Nurse Notes: HM Addressed: Pt declines flu, covid vaccines. No longer doing mammograms  "

## 2024-02-26 ENCOUNTER — Other Ambulatory Visit: Payer: Self-pay

## 2024-02-29 ENCOUNTER — Other Ambulatory Visit: Payer: Self-pay | Admitting: *Deleted

## 2024-02-29 DIAGNOSIS — M316 Other giant cell arteritis: Secondary | ICD-10-CM

## 2024-02-29 MED ORDER — PREDNISONE 1 MG PO TABS: ORAL_TABLET | ORAL | 0 refills | Status: AC

## 2024-02-29 NOTE — Telephone Encounter (Signed)
 Last Fill: 02/03/2024  Next Visit: Message sent to front desk to schedule appt. Return in about 3 months (around 05/03/2024) for Temporal arteritis, Rheumatoid arthritis.    Last Visit: 02/03/2024  Dx: Temporal arteritis   Current Dose per office note on 02/03/2024: 6 mg for 2 weeks,   5 mg for 2 weeks  4 mg for 2 weeks   3 mg for 2 weeks  2 mg for 2 weeks   1 mg for 2 weeks  Okay to refill Prednisone ?

## 2024-02-29 NOTE — Telephone Encounter (Signed)
 Please call to schedule appt. Thank you.  Return in about 3 months (around 05/03/2024) for Temporal arteritis, Rheumatoid arthritis.

## 2024-03-02 ENCOUNTER — Encounter: Payer: Self-pay | Admitting: Family Medicine

## 2024-03-02 MED ORDER — HYDROCODONE-ACETAMINOPHEN 5-325 MG PO TABS: 1.0000 | ORAL_TABLET | ORAL | 0 refills | Status: AC | PRN

## 2024-03-03 ENCOUNTER — Ambulatory Visit (HOSPITAL_COMMUNITY)

## 2024-03-04 ENCOUNTER — Other Ambulatory Visit (HOSPITAL_COMMUNITY): Payer: Self-pay

## 2024-03-04 ENCOUNTER — Other Ambulatory Visit: Payer: Self-pay

## 2024-03-04 ENCOUNTER — Other Ambulatory Visit: Payer: Self-pay | Admitting: Rheumatology

## 2024-03-04 DIAGNOSIS — M0609 Rheumatoid arthritis without rheumatoid factor, multiple sites: Secondary | ICD-10-CM

## 2024-03-04 DIAGNOSIS — Z79899 Other long term (current) drug therapy: Secondary | ICD-10-CM

## 2024-03-04 DIAGNOSIS — M316 Other giant cell arteritis: Secondary | ICD-10-CM

## 2024-03-04 MED ORDER — ACTEMRA ACTPEN 162 MG/0.9ML ~~LOC~~ SOAJ
162.0000 mg | SUBCUTANEOUS | 0 refills | Status: AC
  Filled 2024-03-04: qty 5.4, 84d supply, fill #0
  Filled 2024-03-08: qty 1.8, 28d supply, fill #0

## 2024-03-04 NOTE — Telephone Encounter (Signed)
 Last Fill: 12/08/2023   Labs: 02/03/2024 absolute lymphocytes 715  TB Gold: 11/24/2023 negative    Next Visit: 04/28/2024  Last Visit: 02/03/2024  DX: Temporal arteritis   Current Dose per office note on 02/03/2024: Actemra  162 mg sq injections once every 14 days   Okay to refill Actemra ?

## 2024-03-07 ENCOUNTER — Other Ambulatory Visit: Payer: Self-pay

## 2024-03-08 ENCOUNTER — Other Ambulatory Visit: Payer: Self-pay

## 2024-03-10 ENCOUNTER — Other Ambulatory Visit (HOSPITAL_COMMUNITY): Payer: Self-pay

## 2024-03-14 ENCOUNTER — Other Ambulatory Visit: Payer: Self-pay | Admitting: Pharmacy Technician

## 2024-03-14 ENCOUNTER — Other Ambulatory Visit: Payer: Self-pay

## 2024-03-14 NOTE — Progress Notes (Signed)
 Specialty Pharmacy Refill Coordination Note  [name] is a 89 y.o. female contacted today regarding refills of specialty medication(s) Tocilizumab  (Actemra  ACTPen)   Patient requested Delivery   Delivery date: 03/22/24   Verified address: [address] [name], [address]   Medication will be filled on: 03/22/24

## 2024-03-15 ENCOUNTER — Inpatient Hospital Stay: Admitting: Internal Medicine

## 2024-03-16 ENCOUNTER — Other Ambulatory Visit: Payer: Self-pay

## 2024-03-16 NOTE — Progress Notes (Signed)
 Patient was contacted by the pharmacy regarding their specialty medication(s) Tocilizumab  (Actemra  ACTPen) to reschedule an later delivery date, due to impending winter weather conditions. Medication(s) will be filled 03/24/24 for a delivery by 03/25/24.

## 2024-03-24 ENCOUNTER — Other Ambulatory Visit: Payer: Self-pay

## 2024-04-28 ENCOUNTER — Encounter: Attending: Rheumatology | Admitting: Physician Assistant

## 2024-05-11 ENCOUNTER — Ambulatory Visit: Admitting: Family Medicine

## 2024-06-23 ENCOUNTER — Encounter (HOSPITAL_COMMUNITY)

## 2025-02-27 ENCOUNTER — Ambulatory Visit
